# Patient Record
Sex: Female | Born: 1940
Health system: Southern US, Community
[De-identification: ages and names within clinical notes are randomized; demographics above are authoritative.]

## PROBLEM LIST (undated history)

## (undated) DIAGNOSIS — C50911 Malignant neoplasm of unspecified site of right female breast: Secondary | ICD-10-CM

## (undated) DIAGNOSIS — I10 Essential (primary) hypertension: Secondary | ICD-10-CM

## (undated) DIAGNOSIS — C50919 Malignant neoplasm of unspecified site of unspecified female breast: Secondary | ICD-10-CM

## (undated) DIAGNOSIS — M109 Gout, unspecified: Secondary | ICD-10-CM

## (undated) DIAGNOSIS — E785 Hyperlipidemia, unspecified: Secondary | ICD-10-CM

## (undated) DIAGNOSIS — M1990 Unspecified osteoarthritis, unspecified site: Secondary | ICD-10-CM

## (undated) DIAGNOSIS — E119 Type 2 diabetes mellitus without complications: Secondary | ICD-10-CM

## (undated) DIAGNOSIS — E7800 Pure hypercholesterolemia, unspecified: Secondary | ICD-10-CM

## (undated) DIAGNOSIS — Z923 Personal history of irradiation: Secondary | ICD-10-CM

## (undated) HISTORY — DX: Malignant neoplasm of unspecified site of right female breast: C50.911

## (undated) HISTORY — DX: Hyperlipidemia, unspecified: E78.5

## (undated) HISTORY — PX: ABDOMINAL HYSTERECTOMY: SHX81

## (undated) HISTORY — DX: Malignant neoplasm of unspecified site of unspecified female breast: C50.919

## (undated) HISTORY — DX: Pure hypercholesterolemia, unspecified: E78.00

## (undated) HISTORY — DX: Type 2 diabetes mellitus without complications: E11.9

## (undated) HISTORY — DX: Gout, unspecified: M10.9

## (undated) HISTORY — DX: Unspecified osteoarthritis, unspecified site: M19.90

## (undated) HISTORY — DX: Essential (primary) hypertension: I10

---

## 1990-11-12 DIAGNOSIS — C50919 Malignant neoplasm of unspecified site of unspecified female breast: Secondary | ICD-10-CM

## 1990-11-12 HISTORY — DX: Malignant neoplasm of unspecified site of unspecified female breast: C50.919

## 1990-11-12 HISTORY — PX: BREAST LUMPECTOMY: SHX2

## 2002-11-12 HISTORY — PX: ANKLE FRACTURE SURGERY: SHX122

## 2004-04-14 ENCOUNTER — Other Ambulatory Visit: Payer: Self-pay

## 2004-09-11 ENCOUNTER — Ambulatory Visit: Payer: Self-pay | Admitting: General Surgery

## 2005-09-24 ENCOUNTER — Ambulatory Visit: Payer: Self-pay | Admitting: General Surgery

## 2006-09-25 ENCOUNTER — Ambulatory Visit: Payer: Self-pay | Admitting: General Surgery

## 2007-03-17 ENCOUNTER — Ambulatory Visit: Payer: Self-pay | Admitting: Gastroenterology

## 2007-09-29 ENCOUNTER — Ambulatory Visit: Payer: Self-pay | Admitting: General Surgery

## 2008-10-11 ENCOUNTER — Ambulatory Visit: Payer: Self-pay | Admitting: Surgery

## 2008-11-12 HISTORY — PX: SHOULDER SURGERY: SHX246

## 2009-03-16 ENCOUNTER — Ambulatory Visit: Payer: Self-pay | Admitting: Unknown Physician Specialty

## 2009-04-18 ENCOUNTER — Ambulatory Visit: Payer: Self-pay | Admitting: Unknown Physician Specialty

## 2009-06-09 ENCOUNTER — Ambulatory Visit: Payer: Self-pay | Admitting: Anesthesiology

## 2009-06-13 ENCOUNTER — Ambulatory Visit: Payer: Self-pay | Admitting: Anesthesiology

## 2009-07-20 ENCOUNTER — Ambulatory Visit: Payer: Self-pay | Admitting: Anesthesiology

## 2009-08-15 ENCOUNTER — Ambulatory Visit: Payer: Self-pay | Admitting: Anesthesiology

## 2009-10-18 ENCOUNTER — Ambulatory Visit: Payer: Self-pay | Admitting: Surgery

## 2009-10-18 ENCOUNTER — Ambulatory Visit: Payer: Self-pay | Admitting: Anesthesiology

## 2009-11-23 ENCOUNTER — Ambulatory Visit: Payer: Self-pay | Admitting: Anesthesiology

## 2010-02-03 ENCOUNTER — Ambulatory Visit: Payer: Self-pay | Admitting: Anesthesiology

## 2010-05-19 ENCOUNTER — Ambulatory Visit: Payer: Self-pay | Admitting: Anesthesiology

## 2010-10-19 ENCOUNTER — Ambulatory Visit: Payer: Self-pay | Admitting: Surgery

## 2011-11-05 ENCOUNTER — Ambulatory Visit: Payer: Self-pay | Admitting: Surgery

## 2012-05-19 ENCOUNTER — Ambulatory Visit: Payer: Self-pay | Admitting: Obstetrics and Gynecology

## 2012-05-20 LAB — URINALYSIS, COMPLETE
Bacteria: NONE SEEN
Bilirubin,UR: NEGATIVE
Glucose,UR: NEGATIVE mg/dL (ref 0–75)
Ketone: NEGATIVE
Nitrite: NEGATIVE
RBC,UR: 40 /HPF (ref 0–5)
Specific Gravity: 1.016 (ref 1.003–1.030)
Squamous Epithelial: 3
WBC UR: 13 /HPF (ref 0–5)

## 2012-05-26 ENCOUNTER — Ambulatory Visit: Payer: Self-pay | Admitting: Obstetrics and Gynecology

## 2012-05-28 LAB — PATHOLOGY REPORT

## 2012-11-11 ENCOUNTER — Ambulatory Visit: Payer: Self-pay | Admitting: Family Medicine

## 2012-11-12 HISTORY — PX: MASTECTOMY: SHX3

## 2012-11-18 ENCOUNTER — Ambulatory Visit: Payer: Self-pay | Admitting: Family Medicine

## 2012-12-16 ENCOUNTER — Ambulatory Visit: Payer: Self-pay | Admitting: Oncology

## 2012-12-17 ENCOUNTER — Ambulatory Visit: Payer: Self-pay | Admitting: Oncology

## 2012-12-23 ENCOUNTER — Ambulatory Visit: Payer: Self-pay | Admitting: Oncology

## 2013-01-10 ENCOUNTER — Ambulatory Visit: Payer: Self-pay | Admitting: Oncology

## 2013-01-28 ENCOUNTER — Ambulatory Visit: Payer: Self-pay | Admitting: Surgery

## 2013-02-06 ENCOUNTER — Inpatient Hospital Stay: Payer: Self-pay | Admitting: Surgery

## 2013-02-06 LAB — CBC WITH DIFFERENTIAL/PLATELET
Basophil #: 0.1 10*3/uL (ref 0.0–0.1)
Basophil %: 0.5 %
Eosinophil #: 0 10*3/uL (ref 0.0–0.7)
HCT: 27.3 % — ABNORMAL LOW (ref 35.0–47.0)
HGB: 9.4 g/dL — ABNORMAL LOW (ref 12.0–16.0)
Lymphocyte #: 1 10*3/uL (ref 1.0–3.6)
Lymphocyte %: 10.6 %
MCV: 89 fL (ref 80–100)
Monocyte #: 0.7 x10 3/mm (ref 0.2–0.9)
Neutrophil #: 8 10*3/uL — ABNORMAL HIGH (ref 1.4–6.5)
WBC: 9.8 10*3/uL (ref 3.6–11.0)

## 2013-02-06 LAB — PATHOLOGY REPORT

## 2013-02-07 LAB — CBC WITH DIFFERENTIAL/PLATELET
Eosinophil %: 0.6 %
HCT: 25.4 % — ABNORMAL LOW (ref 35.0–47.0)
HGB: 8.3 g/dL — ABNORMAL LOW (ref 12.0–16.0)
Lymphocyte %: 20.2 %
MCH: 29.4 pg (ref 26.0–34.0)
MCV: 90 fL (ref 80–100)
Monocyte #: 0.4 x10 3/mm (ref 0.2–0.9)
Monocyte %: 5.1 %
Neutrophil #: 5.4 10*3/uL (ref 1.4–6.5)
Neutrophil %: 73.6 %
Platelet: 228 10*3/uL (ref 150–440)
WBC: 7.4 10*3/uL (ref 3.6–11.0)

## 2013-02-08 LAB — CBC WITH DIFFERENTIAL/PLATELET
Eosinophil: 4 %
HCT: 26 % — ABNORMAL LOW (ref 35.0–47.0)
HGB: 8.6 g/dL — ABNORMAL LOW (ref 12.0–16.0)
MCHC: 33 g/dL (ref 32.0–36.0)
MCV: 90 fL (ref 80–100)
Monocytes: 4 %
Myelocyte: 1 %
RBC: 2.87 10*6/uL — ABNORMAL LOW (ref 3.80–5.20)
RDW: 15.6 % — ABNORMAL HIGH (ref 11.5–14.5)
Segmented Neutrophils: 47 %
WBC: 7.9 10*3/uL (ref 3.6–11.0)

## 2013-02-10 ENCOUNTER — Ambulatory Visit: Payer: Self-pay | Admitting: Oncology

## 2013-02-23 LAB — COMPREHENSIVE METABOLIC PANEL
BUN: 18 mg/dL (ref 7–18)
Bilirubin,Total: 0.3 mg/dL (ref 0.2–1.0)
Chloride: 104 mmol/L (ref 98–107)
Co2: 25 mmol/L (ref 21–32)
Creatinine: 1.06 mg/dL (ref 0.60–1.30)
EGFR (Non-African Amer.): 53 — ABNORMAL LOW
Glucose: 103 mg/dL — ABNORMAL HIGH (ref 65–99)
Potassium: 4.2 mmol/L (ref 3.5–5.1)
SGOT(AST): 21 U/L (ref 15–37)
Sodium: 139 mmol/L (ref 136–145)

## 2013-02-23 LAB — CBC CANCER CENTER
Eosinophil #: 0.2 x10 3/mm (ref 0.0–0.7)
Eosinophil %: 2.9 %
HCT: 31.1 % — ABNORMAL LOW (ref 35.0–47.0)
HGB: 10.3 g/dL — ABNORMAL LOW (ref 12.0–16.0)
Lymphocyte %: 38.8 %
MCH: 29.4 pg (ref 26.0–34.0)
MCV: 89 fL (ref 80–100)
Monocyte #: 0.6 x10 3/mm (ref 0.2–0.9)
Monocyte %: 7.6 %
Neutrophil #: 3.6 x10 3/mm (ref 1.4–6.5)
Neutrophil %: 49.2 %
Platelet: 366 x10 3/mm (ref 150–440)
RBC: 3.49 10*6/uL — ABNORMAL LOW (ref 3.80–5.20)
WBC: 7.3 x10 3/mm (ref 3.6–11.0)

## 2013-03-12 ENCOUNTER — Ambulatory Visit: Payer: Self-pay | Admitting: Oncology

## 2013-04-12 ENCOUNTER — Ambulatory Visit: Payer: Self-pay | Admitting: Oncology

## 2013-04-16 DIAGNOSIS — C50911 Malignant neoplasm of unspecified site of right female breast: Secondary | ICD-10-CM

## 2013-04-16 HISTORY — DX: Malignant neoplasm of unspecified site of right female breast: C50.911

## 2013-05-12 ENCOUNTER — Ambulatory Visit: Payer: Self-pay | Admitting: Oncology

## 2013-06-09 ENCOUNTER — Ambulatory Visit: Payer: Self-pay | Admitting: Anesthesiology

## 2013-09-16 ENCOUNTER — Ambulatory Visit: Payer: Self-pay | Admitting: Oncology

## 2013-09-16 LAB — COMPREHENSIVE METABOLIC PANEL
Alkaline Phosphatase: 42 U/L — ABNORMAL LOW (ref 50–136)
Bilirubin,Total: 0.2 mg/dL (ref 0.2–1.0)
Calcium, Total: 9.1 mg/dL (ref 8.5–10.1)
Chloride: 106 mmol/L (ref 98–107)
Co2: 26 mmol/L (ref 21–32)
Osmolality: 282 (ref 275–301)
Potassium: 3.7 mmol/L (ref 3.5–5.1)
Sodium: 140 mmol/L (ref 136–145)

## 2013-09-16 LAB — CBC CANCER CENTER
Basophil #: 0.1 x10 3/mm (ref 0.0–0.1)
Basophil %: 1.5 %
Eosinophil #: 0.1 x10 3/mm (ref 0.0–0.7)
Eosinophil %: 0.9 %
HGB: 12.3 g/dL (ref 12.0–16.0)
MCH: 28.9 pg (ref 26.0–34.0)
MCV: 88 fL (ref 80–100)
Monocyte %: 6 %
Neutrophil %: 55.6 %
RDW: 15 % — ABNORMAL HIGH (ref 11.5–14.5)

## 2013-10-12 ENCOUNTER — Ambulatory Visit: Payer: Self-pay | Admitting: Oncology

## 2013-11-17 ENCOUNTER — Ambulatory Visit: Payer: Self-pay | Admitting: Oncology

## 2014-01-11 DIAGNOSIS — M1990 Unspecified osteoarthritis, unspecified site: Secondary | ICD-10-CM | POA: Insufficient documentation

## 2014-01-11 DIAGNOSIS — I10 Essential (primary) hypertension: Secondary | ICD-10-CM | POA: Insufficient documentation

## 2014-01-11 DIAGNOSIS — M7989 Other specified soft tissue disorders: Secondary | ICD-10-CM | POA: Insufficient documentation

## 2014-01-11 DIAGNOSIS — G629 Polyneuropathy, unspecified: Secondary | ICD-10-CM | POA: Insufficient documentation

## 2014-01-11 DIAGNOSIS — M799 Soft tissue disorder, unspecified: Secondary | ICD-10-CM | POA: Insufficient documentation

## 2014-01-11 DIAGNOSIS — M810 Age-related osteoporosis without current pathological fracture: Secondary | ICD-10-CM | POA: Insufficient documentation

## 2014-01-11 DIAGNOSIS — Z87442 Personal history of urinary calculi: Secondary | ICD-10-CM | POA: Insufficient documentation

## 2014-01-11 DIAGNOSIS — Z853 Personal history of malignant neoplasm of breast: Secondary | ICD-10-CM | POA: Insufficient documentation

## 2014-01-11 DIAGNOSIS — E785 Hyperlipidemia, unspecified: Secondary | ICD-10-CM | POA: Insufficient documentation

## 2014-01-11 DIAGNOSIS — E119 Type 2 diabetes mellitus without complications: Secondary | ICD-10-CM | POA: Insufficient documentation

## 2014-03-26 ENCOUNTER — Ambulatory Visit: Payer: Self-pay | Admitting: Oncology

## 2014-03-26 LAB — CBC CANCER CENTER
BASOS PCT: 1.1 %
Basophil #: 0.1 x10 3/mm (ref 0.0–0.1)
EOS ABS: 0.1 x10 3/mm (ref 0.0–0.7)
EOS PCT: 1.2 %
HCT: 37.7 % (ref 35.0–47.0)
HGB: 12.4 g/dL (ref 12.0–16.0)
LYMPHS ABS: 2.7 x10 3/mm (ref 1.0–3.6)
LYMPHS PCT: 32.6 %
MCH: 29.3 pg (ref 26.0–34.0)
MCHC: 32.7 g/dL (ref 32.0–36.0)
MCV: 90 fL (ref 80–100)
MONO ABS: 0.4 x10 3/mm (ref 0.2–0.9)
Monocyte %: 5 %
Neutrophil #: 5 x10 3/mm (ref 1.4–6.5)
Neutrophil %: 60.1 %
PLATELETS: 256 x10 3/mm (ref 150–440)
RBC: 4.22 10*6/uL (ref 3.80–5.20)
RDW: 15.8 % — AB (ref 11.5–14.5)
WBC: 8.3 x10 3/mm (ref 3.6–11.0)

## 2014-03-26 LAB — COMPREHENSIVE METABOLIC PANEL WITH GFR
Albumin: 3.4 g/dL
Alkaline Phosphatase: 37 U/L — ABNORMAL LOW
Anion Gap: 8
BUN: 20 mg/dL — ABNORMAL HIGH
Bilirubin,Total: 0.4 mg/dL
Calcium, Total: 9 mg/dL
Chloride: 104 mmol/L
Co2: 30 mmol/L
Creatinine: 1 mg/dL
EGFR (African American): >60
EGFR (Non-African Amer.): 57 — ABNORMAL LOW
Glucose: 134 mg/dL — ABNORMAL HIGH
Osmolality: 288
Potassium: 3.5 mmol/L
SGOT(AST): 18 U/L
SGPT (ALT): 22 U/L
Sodium: 142 mmol/L
Total Protein: 7 g/dL

## 2014-04-12 ENCOUNTER — Ambulatory Visit: Payer: Self-pay | Admitting: Oncology

## 2014-09-24 ENCOUNTER — Ambulatory Visit: Payer: Self-pay | Admitting: Oncology

## 2014-10-01 LAB — CBC CANCER CENTER
Basophil #: 0.1 x10 3/mm (ref 0.0–0.1)
Basophil %: 0.6 %
EOS ABS: 0.1 x10 3/mm (ref 0.0–0.7)
Eosinophil %: 1 %
HCT: 36.5 % (ref 35.0–47.0)
HGB: 12 g/dL (ref 12.0–16.0)
LYMPHS ABS: 2.7 x10 3/mm (ref 1.0–3.6)
Lymphocyte %: 29.3 %
MCH: 29.7 pg (ref 26.0–34.0)
MCHC: 32.9 g/dL (ref 32.0–36.0)
MCV: 90 fL (ref 80–100)
MONOS PCT: 5.1 %
Monocyte #: 0.5 x10 3/mm (ref 0.2–0.9)
Neutrophil #: 5.9 x10 3/mm (ref 1.4–6.5)
Neutrophil %: 64 %
PLATELETS: 233 x10 3/mm (ref 150–440)
RBC: 4.03 10*6/uL (ref 3.80–5.20)
RDW: 14.6 % — AB (ref 11.5–14.5)
WBC: 9.3 x10 3/mm (ref 3.6–11.0)

## 2014-10-01 LAB — COMPREHENSIVE METABOLIC PANEL
Albumin: 3.4 g/dL (ref 3.4–5.0)
Alkaline Phosphatase: 36 U/L — ABNORMAL LOW
Anion Gap: 10 (ref 7–16)
BUN: 18 mg/dL (ref 7–18)
Bilirubin,Total: 0.4 mg/dL (ref 0.2–1.0)
Calcium, Total: 8.9 mg/dL (ref 8.5–10.1)
Chloride: 104 mmol/L (ref 98–107)
Co2: 28 mmol/L (ref 21–32)
Creatinine: 1.2 mg/dL (ref 0.60–1.30)
EGFR (African American): 57 — ABNORMAL LOW
GFR CALC NON AF AMER: 47 — AB
Glucose: 101 mg/dL — ABNORMAL HIGH (ref 65–99)
OSMOLALITY: 285 (ref 275–301)
POTASSIUM: 3.9 mmol/L (ref 3.5–5.1)
SGOT(AST): 16 U/L (ref 15–37)
SGPT (ALT): 17 U/L
Sodium: 142 mmol/L (ref 136–145)
TOTAL PROTEIN: 6.8 g/dL (ref 6.4–8.2)

## 2014-10-12 ENCOUNTER — Ambulatory Visit: Payer: Self-pay | Admitting: Oncology

## 2014-11-19 ENCOUNTER — Ambulatory Visit: Payer: Self-pay | Admitting: Surgery

## 2015-03-01 NOTE — Consult Note (Signed)
Chief Complaint:   Subjective/Chief Complaint feels much better, some cough but breathing close to baseline.   VITAL SIGNS/ANCILLARY NOTES: **Vital Signs.:   16-Jul-13 12:06   Vital Signs Type Routine   Temperature Temperature (F) 98.3   Celsius 36.8   Temperature Source Oral   Pulse Pulse 77   Respirations Respirations 18   Systolic BP Systolic BP 841   Diastolic BP (mmHg) Diastolic BP (mmHg) 57   Mean BP 71   Pulse Ox % Pulse Ox % 95   Pulse Ox Activity Level  At rest   Oxygen Delivery Room Air/ 21 %   Brief Assessment:   Cardiac Regular  no murmur    Respiratory normal resp effort  clear BS    Gastrointestinal Normal   Assessment/Plan:  Assessment/Plan:   Assessment This is a 74 year old female with history of diabetes, hypertension, and hyperlipidemia who underwent elective hysterectomy 05/26/2012 seen for prolonged intubation and mechanical ventilation post surgery.  1. Prolonged intubation, mechanical ventilation post surgery: myasthenia gravis is felt to be less likely. There is a strong possibility that the patient is sensitive to general anesthesia and neuromuscular blocking agents. If further evaluation is desired for myasthenia gravis, it can be done as an outpatient. her breathing is close to baseline.  2. Hypertension: stable on current regimen  3. History of diabetes. Should follow the patient's Accu-Cheks. Prevent hypoglycemia. Restart her oral medications when she is able to take orally.    Plan Will sign off.  Please call with questions. medically stable for discharge.  time spent: 15 mins   Electronic Signatures: Remer Macho (MD)  (Signed 16-Jul-13 15:16)  Authored: Chief Complaint, VITAL SIGNS/ANCILLARY NOTES, Brief Assessment, Assessment/Plan   Last Updated: 16-Jul-13 15:16 by Remer Macho (MD)

## 2015-03-01 NOTE — Consult Note (Signed)
PATIENT NAME:  [name], [name] MR#:  [id number] DATE OF BIRTH:  [birth year]  DATE OF CONSULTATION:  05/26/2012  REFERRING PHYSICIAN:  Dr. Dorette Grate  CONSULTING PHYSICIAN:  Cherre Huger, MD  REASON FOR CONSULTATION: Prolonged mechanical ventilation after general anesthesia.   HISTORY OF PRESENT ILLNESS: The patient is a 74 year old female with past medical history of diabetes, hypertension, and hyperlipidemia who underwent elective vaginal hysterectomy today. The patient as part of her preop history had given history that in 2004 after general anesthesia she had prolonged intubation. She was asked if she had any myasthenia gravis or pseudocholinesterase deficiency. However, the patient denied that. The patient's anesthesia was induced without succinylcholine. She was given rocuronium for neuromuscular paralysis. She received reversal of general anesthesia twice because the first time she was felt to be too weak. After extubation the patient continued to have generalized muscle/motor weakness, therefore, she was reintubated, A TO4> revealed that she currently has good motor strength and was thereafter successfully extubated. The anesthesiologist wanted evaluation for further for prolonged intubation after surgery. Therefore, Medicine was consulted.   ALLERGIES: Sulfa.   MEDICATIONS:  1. Atenolol 50 mg daily.  2. Pravachol 40 mg daily.  3. Lisinopril/HCTZ 12.5 mg/10 half a tablet daily.  4. Metformin 500 mg daily.  5. Fosamax weekly.  6. Vitamin D 50,000 international units q. weekly.   PAST MEDICAL HISTORY/SURGICAL HISTORY:  1. Hypertension.  2. Diabetes.  3. Hyperlipidemia.  4. Osteoarthritis.  5. Kidney stones.  6. Status post breast cancer. 7. Bladder prolapse. 8. ORIF ankle. 9. Breast biopsy. 10. Cholecystectomy. 11. Right shoulder surgery. 12. Right lumpectomy.  13. Appendectomy.   SOCIAL HISTORY: The patient denies any history of smoking, alcohol, or drug abuse.    FAMILY HISTORY: Mother and father had hypertension.   REVIEW OF SYSTEMS: The patient reports that in 2004 she was on the ventilator for a prolonged period of time. However, she denies any muscle fatigue, fluctuating skeletal muscle weakness, diplopia, ptosis, dysarthria, dysphagia, any neck or distal limb weakness. She ambulates with a cane because of pain in her ankle after her surgery, not because of any generalized motor weakness. She does all her activities of daily living.   REVIEW OF SYSTEMS: CONSTITUTIONAL: Denies any fever or fatigue. EYES: Denies any blurred or double vision. CARDIOVASCULAR: Denies any chest pain, shortness of breath. RESPIRATORY: Denies any cough, wheezing. GI: Denies any nausea, vomiting, abdominal pain. GU: Denies any dysuria, hematuria. ENDOCRINE: Denies any polyuria or nocturia. MUSCULOSKELETAL: Denies any swelling, muscle weakness. NEUROLOGICAL: Denies any focal weakness, seizure activity, headache. PSYCH: Denies any anxiety, depression. ENDOCRINE: Denies any polyuria, nocturia.   PHYSICAL EXAMINATION:   VITAL SIGNS: Temperature afebrile, heart rate 70, respiratory rate 12, blood pressure 150/70, pulse oximetry 97% on a Ventimask.   HEAD: Atraumatic, normocephalic.   EYES: No pallor, icterus, or cyanosis. Pupils equal, round, and reactive to light and accommodation. Extraocular movements intact.   ENT: Dry mucous membranes. No oropharyngeal erythema or thrush. The patient's voice is hoarse because of the intubation and extubation.   NECK: Supple. No masses. No JVD. No thyromegaly or lymphadenopathy.   CHEST WALL: No tenderness to palpation. Not using accessory muscles of respiration. No intercostal muscle retractions.   LUNGS: Few basal crepitations. No wheezing or rhonchi.   CARDIOVASCULAR: S1, S2 regular. No murmur, rubs, or gallops.   ABDOMEN: Soft. No guarding. No rigidity. Hyperactive bowel sounds.   SKIN: No rashes or lesions.   PERIPHERIES: No  pedal edema.  NEUROLOGIC: The patient is lethargic but oriented x3. Nonfocal neurological examination. Cranial nerves grossly intact.    PSYCH: Lethargic.   ASSESSMENT AND PLAN: This is a 74 year old female with history of diabetes, hypertension, and hyperlipidemia who underwent elective hysterectomy 05/26/2012. Anesthesiologist consulted Medicine for prolonged intubation and mechanical ventilation post surgery. 1. Prolonged intubation, mechanical ventilation post surgery. The anesthesiologist was concerned about pseudocholinesterase deficiency or myasthenia gravis. The patient denies any family history of myasthenia gravis. She denies any history of diplopia, ptosis, neck or distal muscle weakness. Denies any history of CVA, dysarthria, dysphagia, fluctuating skeletal muscle weakness or muscle fatigue. This is the second episode where after general anesthesia she had prolonged intubation and mechanical ventilation. The first was in 2004. Based on symptom evaluation, diagnosis of myasthenia gravis is felt to be less likely. There is a strong possibility that the patient is sensitive to general anesthesia and neuromuscular blocking agents. If further evaluation is desired for myasthenia gravis, it can be done as an outpatient. For the time being recommend observing the patient's respiratory status carefully. Currently she is not hypoxic, appears to have good respiratory effort and cough. Recommend inpatient monitoring.  2. Hypertension. Should continue the patient's antihypertensive medications once she is able to take orally.  3. History of diabetes. Should follow the patient's Accu-Cheks. Prevent hypoglycemia. Restart her oral medications when she is able to take orally.   Reviewed old medical records and discussed with Dr. Phineas Douglas.   TIME SPENT: 75 minutes.   ____________________________ Cherre Huger, MD sp:drc D: 05/26/2012 15:02:31 ET T: 05/26/2012 15:29:25 ET JOB#: [id number]  cc: Cherre Huger, MD, <Dictator> Cherre Huger MD ELECTRONICALLY SIGNED 05/26/2012 17:09

## 2015-03-03 DIAGNOSIS — G8929 Other chronic pain: Secondary | ICD-10-CM | POA: Insufficient documentation

## 2015-03-03 DIAGNOSIS — M25562 Pain in left knee: Secondary | ICD-10-CM

## 2015-03-03 DIAGNOSIS — M25561 Pain in right knee: Secondary | ICD-10-CM

## 2015-03-04 NOTE — Op Note (Signed)
PATIENT NAME:  [name], [name] MR#:  [id number] DATE OF BIRTH:  09/29/1941  DATE OF PROCEDURE:  02/06/2013  PREOPERATIVE DIAGNOSIS: Postoperative bleeding with hematoma of right breast.   POSTOPERATIVE DIAGNOSIS: Postoperative bleeding with hematoma of right breast.   PROCEDURE: Exploration of right breast wound with suture ligation of blood vessels and evacuation of hematoma.   SURGEON: Loreli Dollar, MD   ANESTHESIA: General.   INDICATION: This 74 year old female, with a past history of cancer of the medial aspect of right breast, with previous lymph node dissection and radiation and chemotherapy, recently had a second breast cancer and yesterday had right simple mastectomy. There were no complications during the procedure. She did have 2 Blake drains inserted, and it was realized during the postoperative period that she was developing a hematoma, and exploration was recommended for further evaluation and treatment.   DESCRIPTION OF PROCEDURE: The patient was placed on the operating table in the supine position under general anesthesia. The portions of glue were removed from the wound of the right breast. The Tegaderm and tape were removed which secured the drains. There were 3-0 nylon sutures that remained around the drains. The site was prepared with ChloraPrep and draped in a sterile manner.   The simple mastectomy incision was opened from one end to the other. Glue and suture material were removed. There was a large hematoma within essentially the full extent of the breast wound. There was no bright red bleeding seen at first, but just black hematoma, which was evacuated using a combination of suction, use of lap pads and some gentle abrasion. I spent a good bit of time evacuating hematoma, including small bits of hematoma that were semi-adherent to surrounding tissues. Hematoma did extend from medial to lateral and the cranial extent of the wound and caudal extent of the wound. Estimated  blood loss for the entire procedure was approximately 200 mL, including clot. It was noted that during the course of the procedure, there were a number of small bleeding points which developed and were cauterized. There was one point in the procedure where, medially located, there was an arterial bleeding point which spurted blood, and this small artery was suture ligated with 3-0 chromic, and also during the course of the procedure, there were 2 sites laterally overlying the ribs which were not adequately controlled with electrocautery and were suture ligated with 3-0 chromic. The wound was copiously irrigated with warm saline solution, and the wound was carefully inspected. Several tiny bleeding points were cauterized. Hemostasis was subsequently determined to be intact. The drains remained intact. Next, the wound was closed with a running 4-0 Monocryl subcuticular suture and Dermabond. Also, Dermabond was applied around the drains. The drains were further secured with benzoin and Tegaderm and 2-inch silk tape.   The patient appeared to tolerate the procedure satisfactorily and was then prepared for transfer to the recovery room.   ____________________________ Lenna Sciara. Rochel Brome, MD jws:OSi D: 02/06/2013 16:54:35 ET T: 02/07/2013 09:09:31 ET JOB#: [id number]  cc: Loreli Dollar, MD, <Dictator> Loreli Dollar MD ELECTRONICALLY SIGNED 02/11/2013 17:54

## 2015-03-04 NOTE — Op Note (Signed)
PATIENT NAME:  [name], [name] MR#:  [id number] DATE OF BIRTH:  1941/02/11  DATE OF PROCEDURE:  02/05/2013  PREOPERATIVE DIAGNOSIS: Carcinoma of the right breast.   POSTOPERATIVE DIAGNOSIS:  Carcinoma of the right breast.   PROCEDURE:  Right mastectomy.   SURGEON: Rochel Brome, MD  ANESTHESIA: General.   INDICATION: This 73 year old female with past history of carcinoma of the medial aspect of the right breast with history of right partial mastectomy, lymph node dissection and radiation. She recently developed a mass of the medial, central aspect right breast. Biopsy demonstrated cancer and mastectomy was recommended for further treatment.   DESCRIPTION OF PROCEDURE: The patient was placed on the operating table in the supine position under general anesthesia. The right arm was placed on a lateral arm support. The right breast was prepared with ChloraPrep and draped in a sterile manner. The mass was palpated at the medial aspect of the retroareolar portion of the breast. I noted the site of the old partial mastectomy scar,  which is radially oriented at the 3 o'clock position. Curvilinear incisions were made from medial to lateral encompassing the old partial mastectomy site and also encompassing the central aspect of the breast. Skin flaps were retracted with 3-0 silk sutures. The skin and subcutaneous flaps were raised in the direction of the clavicle, sternum, inferior mammary fold and latissimus dorsi muscle. Electrocautery was used for hemostasis. The breast was elevated off the underlying fascia with electrocautery from medial to lateral. Dissection was carried out up into the axilla removing the axillary tail. The lateral end of the skin ellipse was tagged with a stitch for the pathologist's orientation and the breast specimen was submitted for routine pathology. It is noted that multiple clamped vessels were ligated with 3-0 chromic ligatures and suture ligatures. Multiple small bleeding  points were cauterized. The entire wound was carefully inspected until hemostasis was intact. Next, two [address] [place] Blake drains were inserted and brought out through separate inferior stab wounds.  One was placed up into the axilla and the other along the medial aspect right chest wall. Each drain was secured to the skin with 3-0 nylon suture. Next, the skin was closed with a running 4-0 Monocryl subcuticular suture and Dermabond. The site of the exit point of the drains was dressed with benzoin and Tegaderm and also the drains were anchored to the skin with benzoin and 2 inch silk tape. The drains were activated. There was scant serosanguineous drainage. The patient tolerated the procedure satisfactorily and was then prepared for transfer to the recovery room.     ____________________________ Lenna Sciara. Rochel Brome, MD jws:cc D: 02/05/2013 15:30:37 ET T: 02/05/2013 15:58:47 ET JOB#: [id number]  cc: Loreli Dollar, MD, <Dictator> Loreli Dollar MD ELECTRONICALLY SIGNED 02/05/2013 20:50

## 2015-03-04 NOTE — Discharge Summary (Signed)
PATIENT NAME:  [name], [name] MR#:  [id number] DATE OF BIRTH:  [birth year]  DATE OF ADMISSION:  02/06/2013 DATE OF DISCHARGE:  02/08/2013  HISTORY OF PRESENT ILLNESS: This is a 74 year old female with past history of carcinoma of the medial aspect of the right breast and history of right partial mastectomy, lymph node dissection and radiation, who recently developed a mass at the medial aspect of the central portion of the right breast. Biopsy demonstrated cancer and mastectomy was recommended for further treatment.   Details of the history and physical are recorded on the history and physical.  The patient came in through the outpatient surgery department and was carried to the operating room where she had a simple mastectomy with insertion of drains. There was no unusual bleeding during the course of the surgery. Postoperatively, she was kept in observation bed. IV fluids, analgesics and nurses monitored her drains. She did develop postoperative swelling, which according to the physical exam appeared to be a hematoma. The patient was carried back to the operating room the next day and reopened the incision, evacuated a hematoma, cauterized several small points which started bleeding during the case and also suture ligated one bleeding point. Postoperatively, the patient did still have her drains in. She was kept in the hospital for an additional period of observation and has subsequently tolerated her diet and had minimal amount of postoperative drainage.   FINAL DIAGNOSIS: Carcinoma of the right breast.   Discharge instructions given and plans made for followup in the office.  ____________________________ J. Rochel Brome, MD jws:aw D: 02/23/2013 09:07:37 ET T: 02/23/2013 09:21:43 ET JOB#: [id number]  cc: Loreli Dollar, MD, <Dictator> Loreli Dollar MD ELECTRONICALLY SIGNED 02/25/2013 21:34

## 2015-03-04 NOTE — H&P (Signed)
PATIENT NAME:  [name], [name] MR#:  [id number] DATE OF BIRTH:  [birth year]  DATE OF ADMISSION:  06/09/2013  CHIEF COMPLAINT: Left posterolateral leg pain and back pain.   PROCEDURE: None.   HISTORY OF PRESENT ILLNESS: [name] is a pleasant 74 year old white female with a long-standing history of left-sided lower back pain. She was seen by me a few years ago in the [facility] for similar complaints, namely low back pain with left side radiating pain, with an MRI dated June 2010 showing evidence of multilevel degenerative disk disease most notably at L4-L5, with evidence of spinal canal stenosis. There is also evidence of facet hypertrophy at L4-L5.   PAST MEDICAL HISTORY: Significant for high blood pressure, kidney stones, diabetes, osteoarthritis and history of breast CA.   SOCIAL HISTORY: She is married, 2 children. Never smoked. Works part time.   PAST SURGICAL HISTORY: Includes right breast lumpectomy and radiation therapy, right ankle surgery, right shoulder surgery, cholecystectomy, vaginal hysterectomy.   CURRENT MEDICATIONS: Include Fosamax, gemfibrozil, metformin, vitamin D, atenolol, tamoxifen, acetaminophen and HCTZ/lisinopril   PHYSICAL EXAMINATION:  VITAL SIGNS: Reveals a blood pressure of 154/77, pulse 66, respirations 16, O2 saturation 97%.  EYES: Her pupils are equally round and reactive to light. Extraocular muscles intact.  HEART: Regular rate and rhythm.  LUNGS: Clear to auscultation without wheezing or rales.  MUSCULOSKELETAL AND NEUROLOGIC: Inspection of the low back reveals some mild paraspinous muscle tenderness with no overt trigger points. She does have some mild pain on extension. She is ambulating well. Her strength to the lower extremities appears to be well preserved. She does use a cane for support.   IMAGING: Results on a PET scan dated 12/23/2012 showing no definitive foci of abnormal hypermetabolic activity and equivocal findings at T6 with what  appears to be a degenerative process.   ASSESSMENT:  1. Spinal stenosis.  2. Degenerative disk disease.  3. Radiculitis, L5, right side.   PLAN: 1. [name] appears to have recurrence of a chronic condition with some radiculitis in the L5 distribution, right side, that has previously responded to epidural steroid administration. I think it would be reasonable to proceed with a repeat series of these. She does not desire to proceed with this today, and we will make plans to proceed with epidural steroid on her next visit in approximately 1 week. The risks and benefits were once again reviewed. All questions answered. No guarantees are made.  2. Continue with stretching and strengthening exercises.  3. She may be a candidate for a diagnostic facet block, right side.   ____________________________ Alvina Filbert Andree Elk, MD jga:OSi D: 06/10/2013 09:27:50 ET T: 06/10/2013 09:45:14 ET JOB#: [id number]  cc: Alvina Filbert. Andree Elk, MD, <Dictator> Dr. Christiane Ha Annalysa Mohammad MD ELECTRONICALLY SIGNED 06/12/2013 8:21

## 2015-03-04 NOTE — H&P (Signed)
PATIENT NAME:  [name], [name] MR#:  [id number] DATE OF BIRTH:  1941-03-23  DATE OF ADMISSION:  02/06/2013  This 74 year old came in for right mastectomy. She had recent findings of right breast mass and biopsy demonstrated invasive ductal carcinoma. ER/PR positive, HER-2/neu negative. She has had oncology evaluation. PET scan showed no evidence of distant metastasis.  She had a history of carcinoma of the right breast 21 years ago, had right partial mastectomy with incision at the 3 o'clock position radially oriented and had lymph node dissection. Lymph nodes were negative.  OTHER PAST MEDICAL HISTORY:  Includes non-insulin-dependent diabetes mellitus, hypertension, hyperlipidemia, arthritis, kidney stones, osteoporosis.   MEDICATIONS: Include pravastatin, lisinopril-hydrochlorothiazide, atenolol, metformin, Fosamax, gemfibrozil; doses are same as recorded on preop history and physical.   ALLERGIES:  SULFA.   PHYSICAL EXAMINATION: GENERAL:  She was seen in the preop holding area, was awake, alert and oriented.  HEENT:  Sclerae clear. Pharynx clear.  HEART:  Regular rhythm, S1, S2.  LUNGS:  Clear.   The right breast was marked with "yes."   DIAGNOSIS:  Carcinoma of the right breast.  PAST HISTORY:  Carcinoma of the right breast.   Plans made for right mastectomy as discussed.   ____________________________ Lenna Sciara. Rochel Brome, MD jws:ce D: 03/05/2013 17:17:40 ET T: 03/05/2013 18:07:45 ET JOB#: [id number]  cc: Loreli Dollar, MD, <Dictator> Loreli Dollar MD ELECTRONICALLY SIGNED 03/09/2013 9:58

## 2015-03-06 NOTE — Op Note (Signed)
PATIENT NAME:  [name], [name] MR#:  [id number] DATE OF BIRTH:  1941/06/06  DATE OF PROCEDURE:  05/26/2012  PREOPERATIVE DIAGNOSIS: Symptomatic pelvic relaxation.   POSTOPERATIVE DIAGNOSIS: Symptomatic pelvic relaxation.   PROCEDURES:  1. TVH. 2. Anterior repair.   SURGEON: Ricky L. Amalia Hailey, MD   ASSISTANT: Boykin Nearing, MD   ANESTHESIA: General endotracheal.   FINDINGS: Cube pessary in vagina which was cleaned and packaged to take back to the office to offer patient. Some uterine descensus with moderate to large cystocele. Good spillage of urine after placement of Foley catheter.   SPECIMENS: Uterus without cervix.  ESTIMATED BLOOD LOSS: 200.   COMPLICATIONS: None.   DRAINS: Foley.  PACKING: Present.   PROCEDURE IN DETAIL: Patient consented. She was taken to the operating room and placed in the supine position and anesthesia was initiated. She was then placed in the dorsal lithotomy position using candy-cane stirrups, prepped and draped in the usual sterile fashion. The pessary was still there and the pessary was removed and the vagina was prepped again. She was then draped. The cervix was visualized and injected circumferentially with dilute vasopressin; there was a small ulceration noted at 1 o'clock and care was taken to include this in the excised specimen.   Uterus was cut circumferentially with scalpel after injecting circumferentially with dilute vasopressin and direct entry was made in the posterior cul-de-sac and weighted speculum was placed and posterior cuff was tagged. We then alternated clamp, cut, and tie technique using Heaney-Ballantine clamps and 0 Vicryl suture until uterus was delivered. The posterior cuff was closed with a running baseball-type stitch of 0 Vicryl. Next, we turned our attention to the bladder.   Taking care to stay in the midline, anterior vault was injected with dilute vasopressin 10 mL. Allis were then placed at 1 o'clock and 11  o'clock and Metzenbaum scissors were used to bluntly and sharply dissect along the midline which was divided. The edges of this new epithelial incision were grasped bilaterally with Allis-Adairs and scalpel was used to lightly score the underlying tissue which was then bluntly dissected with the overlying epithelium without difficulty bilaterally. Purse stitch of 3-0 Vicryl was then used to reduce the bulk of the midline defect and then 3-0 Ethibond was used in simple interrupted sutures to approximate the underlying tissue. Excess epithelium was trimmed free and anterior vault and anterior portion of the cuff were closed with a running interlocking of 0 Vicryl.   Areas were inspected for hemostasis and seemed to be excellent. Bladder continued to drain clear urine. Vault was packed with gauze soaked in Premarin and this was tied to the catheter, both of which will be removed in the morning.   The patient tolerated the procedure well. All instrument, needle, and sponge counts were correct. She received 1 gram Ancef preoperatively. I anticipate a routine postoperative course.   ____________________________ Rockey Situ. Amalia Hailey, MD rle:drc D: 05/26/2012 12:27:57 ET T: 05/26/2012 12:59:31 ET JOB#: [id number]  cc: Audry Pili L. Amalia Hailey, MD, <Dictator> Selmer Dominion MD ELECTRONICALLY SIGNED 05/26/2012 13:52

## 2015-04-01 ENCOUNTER — Other Ambulatory Visit: Payer: Self-pay | Admitting: *Deleted

## 2015-04-01 DIAGNOSIS — C50919 Malignant neoplasm of unspecified site of unspecified female breast: Secondary | ICD-10-CM

## 2015-04-06 ENCOUNTER — Ambulatory Visit: Payer: Self-pay | Admitting: Oncology

## 2015-04-06 ENCOUNTER — Other Ambulatory Visit: Payer: Self-pay

## 2015-04-13 ENCOUNTER — Inpatient Hospital Stay: Payer: Medicare HMO | Attending: Oncology

## 2015-04-13 ENCOUNTER — Inpatient Hospital Stay (HOSPITAL_BASED_OUTPATIENT_CLINIC_OR_DEPARTMENT_OTHER): Payer: Medicare HMO | Admitting: Oncology

## 2015-04-13 ENCOUNTER — Encounter (INDEPENDENT_AMBULATORY_CARE_PROVIDER_SITE_OTHER): Payer: Self-pay

## 2015-04-13 VITALS — BP 123/74 | HR 71 | Temp 95.7°F | Wt 147.7 lb

## 2015-04-13 DIAGNOSIS — I10 Essential (primary) hypertension: Secondary | ICD-10-CM

## 2015-04-13 DIAGNOSIS — C50911 Malignant neoplasm of unspecified site of right female breast: Secondary | ICD-10-CM

## 2015-04-13 DIAGNOSIS — Z170 Estrogen receptor positive status [ER+]: Secondary | ICD-10-CM

## 2015-04-13 DIAGNOSIS — C50919 Malignant neoplasm of unspecified site of unspecified female breast: Secondary | ICD-10-CM

## 2015-04-13 DIAGNOSIS — Z79810 Long term (current) use of selective estrogen receptor modulators (SERMs): Secondary | ICD-10-CM

## 2015-04-13 DIAGNOSIS — Z853 Personal history of malignant neoplasm of breast: Secondary | ICD-10-CM | POA: Diagnosis not present

## 2015-04-13 DIAGNOSIS — Z79899 Other long term (current) drug therapy: Secondary | ICD-10-CM | POA: Diagnosis not present

## 2015-04-13 DIAGNOSIS — E119 Type 2 diabetes mellitus without complications: Secondary | ICD-10-CM | POA: Insufficient documentation

## 2015-04-13 DIAGNOSIS — Z923 Personal history of irradiation: Secondary | ICD-10-CM

## 2015-04-13 DIAGNOSIS — M1990 Unspecified osteoarthritis, unspecified site: Secondary | ICD-10-CM | POA: Diagnosis not present

## 2015-04-13 LAB — COMPREHENSIVE METABOLIC PANEL
ALK PHOS: 28 U/L — AB (ref 38–126)
ALT: 18 U/L (ref 14–54)
AST: 26 U/L (ref 15–41)
Albumin: 3.8 g/dL (ref 3.5–5.0)
Anion gap: 7 (ref 5–15)
BUN: 36 mg/dL — ABNORMAL HIGH (ref 6–20)
CO2: 26 mmol/L (ref 22–32)
Calcium: 8.3 mg/dL — ABNORMAL LOW (ref 8.9–10.3)
Chloride: 100 mmol/L — ABNORMAL LOW (ref 101–111)
Creatinine, Ser: 1.27 mg/dL — ABNORMAL HIGH (ref 0.44–1.00)
GFR calc Af Amer: 48 mL/min — ABNORMAL LOW (ref >60)
GFR calc non Af Amer: 41 mL/min — ABNORMAL LOW (ref >60)
GLUCOSE: 104 mg/dL — AB (ref 65–99)
POTASSIUM: 4 mmol/L (ref 3.5–5.1)
Sodium: 133 mmol/L — ABNORMAL LOW (ref 135–145)
TOTAL PROTEIN: 6.9 g/dL (ref 6.5–8.1)
Total Bilirubin: 0.4 mg/dL (ref 0.3–1.2)

## 2015-04-13 LAB — CBC WITH DIFFERENTIAL/PLATELET
Basophils Absolute: 0.1 10*3/uL (ref 0–0.1)
Basophils Relative: 1 %
EOS ABS: 0.2 10*3/uL (ref 0–0.7)
EOS PCT: 2 %
HEMATOCRIT: 37 % (ref 35.0–47.0)
Hemoglobin: 11.8 g/dL — ABNORMAL LOW (ref 12.0–16.0)
LYMPHS ABS: 2.5 10*3/uL (ref 1.0–3.6)
LYMPHS PCT: 26 %
MCH: 28.5 pg (ref 26.0–34.0)
MCHC: 32 g/dL (ref 32.0–36.0)
MCV: 88.9 fL (ref 80.0–100.0)
MONOS PCT: 6 %
Monocytes Absolute: 0.5 10*3/uL (ref 0.2–0.9)
NEUTROS ABS: 6.3 10*3/uL (ref 1.4–6.5)
NEUTROS PCT: 65 %
PLATELETS: 284 10*3/uL (ref 150–440)
RBC: 4.16 MIL/uL (ref 3.80–5.20)
RDW: 14.8 % — ABNORMAL HIGH (ref 11.5–14.5)
WBC: 9.7 10*3/uL (ref 3.6–11.0)

## 2015-04-13 NOTE — Progress Notes (Signed)
Pt has never smoked.  Does not have living will.

## 2015-04-17 ENCOUNTER — Encounter: Payer: Self-pay | Admitting: Oncology

## 2015-04-17 DIAGNOSIS — C50911 Malignant neoplasm of unspecified site of right female breast: Secondary | ICD-10-CM | POA: Insufficient documentation

## 2015-04-17 NOTE — Progress Notes (Signed)
[facility] [place] @ [place] [phone]  Fax:([phone]) [place]: Sep 14, 1941  MR#: [id number]  [facility]#:[id number]  Patient Care Team: Marguerita Merles, MD as PCP - General (Family Medicine)  CHIEF COMPLAINT:  Chief Complaint  Patient presents with  . Follow-up    Oncology History   1.abnormal mammogram dated January, 2014.  Her right breast approximately 1.2 x 0.7 cm palpable abnormality confirmed on ultrasound  Biopsy was consistent with invasive mammary carcinoma.  Estrogen receptor, progesterone receptor, HER-2/neu receptor pending.Marland Kitchen 2.in 1992 patient had carcinoma of right breast (Lesion  at  3 o'clock) had lumpectomy axillary node dissection  axillary  nodes are reported to be negative.  Patient had radiation therapy followed by 5 years of tamoxifen. 3.her right breast mastectomy(27th of March, 2014) pT1c Pnx MO     Estrogen receptor IHC: Positive, 90% strong staining.         Progesterone receptor: Negative, 0% staining.         HER2-neu FISH: NOT amplified, HER2/CEP17 ratio 1.11.  Oncotype   DX score was suggestive of 11% recurrent disease or pedal next 10 years with anti-hormone therapy 3.Patient was started on letrozole but di     Cancer of right breast   04/17/2015 Initial Diagnosis Cancer of right breast    No flowsheet data found.  INTERVAL HISTORY:  74 year old lady came today for further follow-up regarding carcinoma of right breast.  On tamoxifen.  Tolerating treatment very well.  No hot flashes.  No bony pains Appetite remains stable   REVIEW OF SYSTEMS:       GENERAL:  Feels good.  Active.  No fevers, sweats or weight loss. PERFORMANCE STATUS (ECOG): 01 HEENT:  No visual changes, runny nose, sore throat, mouth sores or tenderness. Lungs: No shortness of breath or cough.  No hemoptysis. Cardiac:  No chest pain, palpitations, orthopnea, or PND. GI:  No nausea, vomiting, diarrhea, constipation, melena or hematochezia. GU:  No urgency,  frequency, dysuria, or hematuria. Musculoskeletal:  No back pain.  No joint pain.  No muscle tenderness. Extremities:  No pain or swelling. Skin:  No rashes or skin changes. Neuro:  No headache, numbness or weakness, balance or coordination issues. Endocrine:  No diabetes, thyroid issues, hot flashes or night sweats. Psych:  No mood changes, depression or anxiety. Pain:  No focal pain. Review of systems:  All other systems reviewed and found to be negative. As per HPI. Otherwise, a complete review of systems is negatve.  PAST MEDICAL HISTORY: Past Medical History  Diagnosis Date  . Cancer of right breast 04/17/2015     HTN:    arthritis:    Diabetes Mellitus, Type II (NIDD):    hx of kidney stones:    breast cancer:    vaginal hysterectomy:    gallbladder removed: 1993   right shoulder surgery: 2004   right ankle surgery: 2004   Tumor removed on right breast and radiation given:   Preventive Screening:  Has patient had any of the following test? Colonscopy  Mammography (1)   Last Colonoscopy: 2009(1)   Last Mammography: Jan 2015(1)   Smoking History: Smoking History Never Smoked.(1)  PFSH: Family History: noncontributory  Social History: negative alcohol, negative tobacco  Additional Past Medical and Surgical History: As mentioned above   ADVANCED DIRECTIVES:  Patient does not have any advanced healthcare directive. Information has been given. HEALTH MAINTENANCE: History  Substance Use Topics  . Smoking status: Not on file  .  Smokeless tobacco: Not on file  . Alcohol Use: Not on file      Allergies  Allergen Reactions  . Other Anaphylaxis    Anesthisia has been a health issue for her in the past.   . Sulfa Antibiotics Rash    Current Outpatient Prescriptions  Medication Sig Dispense Refill  . HYDROcodone-acetaminophen (NORCO/VICODIN) 5-325 MG per tablet Take by mouth.    . levothyroxine (SYNTHROID, LEVOTHROID) 50 MCG tablet Take by mouth.    Marland Kitchen  lisinopril-hydrochlorothiazide (PRINZIDE,ZESTORETIC) 20-12.5 MG per tablet     . tamoxifen (NOLVADEX) 20 MG tablet     . alendronate (FOSAMAX) 70 MG tablet Take by mouth.    Marland Kitchen atenolol (TENORMIN) 50 MG tablet Take by mouth.    Marland Kitchen gemfibrozil (LOPID) 600 MG tablet Take by mouth.    . metFORMIN (GLUCOPHAGE) 500 MG tablet Take by mouth.    . Vitamin D, Ergocalciferol, (DRISDOL) 50000 UNITS CAPS capsule Take by mouth.     No current facility-administered medications for this visit.    OBJECTIVE:  Filed Vitals:   04/13/15 1235  BP: 123/74  Pulse: 71  Temp: 95.7 F (35.4 C)     There is no height on file to calculate BMI.    ECOG FS:1 - Symptomatic but completely ambulatory  PHYSICAL EXAM: General  status: Performance status is good.  Patient has not lost significant weight HEENT: No evidence of stomatitis. Sclera and conjunctivae :: No jaundice.   pale looking. Lungs: Air  entry equal on both sides.  No rhonchi.  No rales.  Cardiac: Heart sounds are normal.  No pericardial rub.  No murmur. Lymphatic system: Cervical, axillary, inguinal, lymph nodes not palpable GI: Abdomen is soft.  No ascites.  Liver spleen not palpable.  No tenderness.  Bowel sounds are within normal limit Lower extremity: No edema Neurological system: Higher functions, cranial nerves intact no evidence of peripheral neuropathy. Skin: No rash.  No ecchymosis.. Examination of the right breast no evidence of recurrent disease . Status post mastectomy l Left breast: Free of masses LAB RESULTS:  Appointment on 04/13/2015  Component Date Value Ref Range Status  . WBC 04/13/2015 9.7  3.6 - 11.0 K/uL Final  . RBC 04/13/2015 4.16  3.80 - 5.20 MIL/uL Final  . Hemoglobin 04/13/2015 11.8* 12.0 - 16.0 g/dL Final  . HCT 04/13/2015 37.0  35.0 - 47.0 % Final  . MCV 04/13/2015 88.9  80.0 - 100.0 fL Final  . MCH 04/13/2015 28.5  26.0 - 34.0 pg Final  . MCHC 04/13/2015 32.0  32.0 - 36.0 g/dL Final  . RDW 04/13/2015 14.8* 11.5 -  14.5 % Final  . Platelets 04/13/2015 284  150 - 440 K/uL Final  . Neutrophils Relative % 04/13/2015 65   Final  . Neutro Abs 04/13/2015 6.3  1.4 - 6.5 K/uL Final  . Lymphocytes Relative 04/13/2015 26   Final  . Lymphs Abs 04/13/2015 2.5  1.0 - 3.6 K/uL Final  . Monocytes Relative 04/13/2015 6   Final  . Monocytes Absolute 04/13/2015 0.5  0.2 - 0.9 K/uL Final  . Eosinophils Relative 04/13/2015 2   Final  . Eosinophils Absolute 04/13/2015 0.2  0 - 0.7 K/uL Final  . Basophils Relative 04/13/2015 1   Final  . Basophils Absolute 04/13/2015 0.1  0 - 0.1 K/uL Final  . Sodium 04/13/2015 133* 135 - 145 mmol/L Final  . Potassium 04/13/2015 4.0  3.5 - 5.1 mmol/L Final  . Chloride 04/13/2015 100* 101 - 111  mmol/L Final  . CO2 04/13/2015 26  22 - 32 mmol/L Final  . Glucose, Bld 04/13/2015 104* 65 - 99 mg/dL Final  . BUN 04/13/2015 36* 6 - 20 mg/dL Final  . Creatinine, Ser 04/13/2015 1.27* 0.44 - 1.00 mg/dL Final  . Calcium 04/13/2015 8.3* 8.9 - 10.3 mg/dL Final  . Total Protein 04/13/2015 6.9  6.5 - 8.1 g/dL Final  . Albumin 04/13/2015 3.8  3.5 - 5.0 g/dL Final  . AST 04/13/2015 26  15 - 41 U/L Final  . ALT 04/13/2015 18  14 - 54 U/L Final  . Alkaline Phosphatase 04/13/2015 28* 38 - 126 U/L Final  . Total Bilirubin 04/13/2015 0.4  0.3 - 1.2 mg/dL Final  . GFR calc non Af Amer 04/13/2015 41* >60 mL/min Final  . GFR calc Af Amer 04/13/2015 48* >60 mL/min Final   Comment: (NOTE) The eGFR has been calculated using the CKD EPI equation. This calculation has not been validated in all clinical situations. eGFR's persistently <60 mL/min signify possible Chronic Kidney Disease.   . Anion gap 04/13/2015 7  5 - 15 Final     STUDIES: No results found.  ASSESSMENT: Carcinoma of right breast.  All Data has been reviewed.  No evidence of recurrent or progressive disease  MEDICAL DECISION MAKING:  Continue tamoxifen Patient expressed understanding and was in agreement with this plan. She also  understands that She can call clinic at any time with any questions, concerns, or complaints.    Cancer of right breast   Staging form: Breast, AJCC 7th Edition     Clinical: Stage IA (T1c, N0, M0) - Marni Griffon, MD   04/17/2015 5:53 AM

## 2015-09-19 ENCOUNTER — Other Ambulatory Visit: Payer: Self-pay | Admitting: Preventative Medicine

## 2015-09-19 ENCOUNTER — Ambulatory Visit
Admission: RE | Admit: 2015-09-19 | Discharge: 2015-09-19 | Disposition: A | Discharge status: Home / Self Care | Payer: Medicare HMO | Source: Ambulatory Visit | Attending: Preventative Medicine | Admitting: Preventative Medicine

## 2015-09-19 DIAGNOSIS — L03115 Cellulitis of right lower limb: Secondary | ICD-10-CM

## 2015-09-19 DIAGNOSIS — Z9889 Other specified postprocedural states: Secondary | ICD-10-CM | POA: Insufficient documentation

## 2015-10-11 ENCOUNTER — Encounter: Payer: Medicare HMO | Attending: Surgery | Admitting: Surgery

## 2015-10-11 DIAGNOSIS — E11622 Type 2 diabetes mellitus with other skin ulcer: Secondary | ICD-10-CM | POA: Insufficient documentation

## 2015-10-11 DIAGNOSIS — L97312 Non-pressure chronic ulcer of right ankle with fat layer exposed: Secondary | ICD-10-CM | POA: Diagnosis not present

## 2015-10-11 DIAGNOSIS — I10 Essential (primary) hypertension: Secondary | ICD-10-CM | POA: Diagnosis not present

## 2015-10-11 DIAGNOSIS — M1990 Unspecified osteoarthritis, unspecified site: Secondary | ICD-10-CM | POA: Insufficient documentation

## 2015-10-11 DIAGNOSIS — E785 Hyperlipidemia, unspecified: Secondary | ICD-10-CM | POA: Insufficient documentation

## 2015-10-11 DIAGNOSIS — Z853 Personal history of malignant neoplasm of breast: Secondary | ICD-10-CM | POA: Diagnosis not present

## 2015-10-11 DIAGNOSIS — I890 Lymphedema, not elsewhere classified: Secondary | ICD-10-CM | POA: Insufficient documentation

## 2015-10-11 NOTE — Progress Notes (Addendum)
ASHKA, KHAYAT ([id number]) Visit Report for 10/11/2015 Allergy List Details Patient Name: [name], [name]. Date of Service: 10/11/2015 1:30 PM Medical Record Number: [id number] Patient Account Number: [id number] Date of Birth/Sex: 24-Mar-1941 (74 y.o. Female) Treating RN: Baruch Gouty, RN, BSN, Velva Harman Primary Care Physician: Delight Stare Other Clinician: Referring Physician: Delight Stare Treating Physician/Extender: Frann Rider in Treatment: 0 Allergies Active Allergies sulfa Allergy Notes Electronic Signature(s) Signed: 10/11/2015 1:06:51 PM By: Regan Lemming BSN, RN Entered By: Regan Lemming on 10/11/2015 13:06:51 [place], [name] Chard ([id number]) -------------------------------------------------------------------------------- [place] Details Patient Name: [name]. Date of Service: 10/11/2015 1:30 PM Medical Record Number: [id number] Patient Account Number: [id number] Date of Birth/Sex: [birth year] (74 y.o. Female) Treating RN: Afful, RN, BSN, Velva Harman Primary Care Physician: Delight Stare Other Clinician: Referring Physician: Delight Stare Treating Physician/Extender: Frann Rider in Treatment: 0 Visit Information Patient Arrived: Cane Arrival Time: 13:26 Accompanied By: SPOuse Transfer Assistance: None Patient Identification Verified: Yes Secondary Verification Process Yes Completed: Patient Requires Transmission- No Based Precautions: Patient Has Alerts: Yes Patient Alerts: Patient on Blood Thinner Aspirin Electronic Signature(s) Signed: 10/11/2015 1:29:54 PM By: Regan Lemming BSN, RN Previous Signature: 10/11/2015 1:29:18 PM Version By: Regan Lemming BSN, RN Entered By: Regan Lemming on 10/11/2015 13:29:54 [place], [name] Chard ([id number]) -------------------------------------------------------------------------------- Clinic Level of Care Assessment Details Patient Name: [name]. Date of Service: 10/11/2015 1:30 PM Medical  Record Number: [id number] Patient Account Number: [id number] Date of Birth/Sex: 1941/07/01 (74 y.o. Female) Treating RN: Afful, RN, BSN, Velva Harman Primary Care Physician: Delight Stare Other Clinician: Referring Physician: Delight Stare Treating Physician/Extender: Frann Rider in Treatment: 0 Clinic Level of Care Assessment Items TOOL 2 Quantity Score []  - Use when only an EandM is performed on the INITIAL visit 0 ASSESSMENTS - Nursing Assessment / Reassessment X - General Physical Exam (combine w/ comprehensive assessment (listed just 1 20 below) when performed on new pt. evals) X - Comprehensive Assessment (HX, ROS, Risk Assessments, Wounds Hx, etc.) 1 25 ASSESSMENTS - Wound and Skin Assessment / Reassessment []  - Simple Wound Assessment / Reassessment - one wound 0 X - Complex Wound Assessment / Reassessment - multiple wounds 2 5 []  - Dermatologic / Skin Assessment (not related to wound area) 0 ASSESSMENTS - Ostomy and/or Continence Assessment and Care []  - Incontinence Assessment and Management 0 []  - Ostomy Care Assessment and Management (repouching, etc.) 0 PROCESS - Coordination of Care X - Simple Patient / Family Education for ongoing care 1 15 []  - Complex (extensive) Patient / Family Education for ongoing care 0 X - Staff obtains Programmer, systems, Records, Test Results / Process Orders 1 10 []  - Staff telephones HHA, Nursing Homes / Clarify orders / etc 0 []  - Routine Transfer to another Facility (non-emergent condition) 0 []  - Routine Hospital Admission (non-emergent condition) 0 []  - New Admissions / Biomedical engineer / Ordering NPWT, Apligraf, etc. 0 []  - Emergency Hospital Admission (emergent condition) 0 X - Simple Discharge Coordination 1 10 [name], [name]. ([id number]) []  - Complex (extensive) Discharge Coordination 0 PROCESS - Special Needs []  - Pediatric / Minor Patient Management 0 []  - Isolation Patient Management 0 []  - Hearing / Language / Visual special  needs 0 []  - Assessment of Community assistance (transportation, D/C planning, etc.) 0 []  - Additional assistance / Altered mentation 0 []  - Support Surface(s) Assessment (bed, cushion, seat, etc.) 0 INTERVENTIONS - Wound Cleansing / Measurement X - Wound Imaging (photographs - any number of wounds) 1 5 []  - Wound Tracing (  instead of photographs) 0 []  - Simple Wound Measurement - one wound 0 X - Complex Wound Measurement - multiple wounds 2 5 []  - Simple Wound Cleansing - one wound 0 []  - Complex Wound Cleansing - multiple wounds 0 INTERVENTIONS - Wound Dressings X - Small Wound Dressing one or multiple wounds 2 10 []  - Medium Wound Dressing one or multiple wounds 0 []  - Large Wound Dressing one or multiple wounds 0 []  - Application of Medications - injection 0 INTERVENTIONS - Miscellaneous []  - External ear exam 0 []  - Specimen Collection (cultures, biopsies, blood, body fluids, etc.) 0 []  - Specimen(s) / Culture(s) sent or taken to Lab for analysis 0 []  - Patient Transfer (multiple staff / Civil Service fast streamer / Similar devices) 0 []  - Simple Staple / Suture removal (25 or less) 0 []  - Complex Staple / Suture removal (26 or more) 0 [name], [name]. ([id number]) []  - Hypo / Hyperglycemic Management (close monitor of Blood Glucose) 0 X - Ankle / Brachial Index (ABI) - do not check if billed separately 1 15 Has the patient been seen at the hospital within the last three years: Yes Total Score: 140 Level Of Care: New/Established - Level 4 Electronic Signature(s) Signed: 10/11/2015 2:11:40 PM By: Regan Lemming BSN, RN Entered By: Regan Lemming on 10/11/2015 14:11:40 [place], [name] Chard ([id number]) -------------------------------------------------------------------------------- Encounter Discharge Information Details Patient Name: [name] Ramus L. Date of Service: 10/11/2015 1:30 PM Medical Record Number: [id number] Patient Account Number: [id number] Date of Birth/Sex: 1941-10-19 (74 y.o.  Female) Treating RN: Afful, RN, BSN, Velva Harman Primary Care Physician: Delight Stare Other Clinician: Referring Physician: Delight Stare Treating Physician/Extender: Frann Rider in Treatment: 0 Encounter Discharge Information Items Discharge Pain Level: 0 Discharge Condition: Stable Ambulatory Status: Cane Discharge Destination: Home Transportation: Private Auto Accompanied By: spouse Schedule Follow-up Appointment: No Medication Reconciliation completed and provided to Patient/Care No Salimatou Simone: Provided on Clinical Summary of Care: 10/11/2015 Form Type Recipient Paper Patient DM Electronic Signature(s) Signed: 10/11/2015 2:23:30 PM By: Ruthine Dose Previous Signature: 10/11/2015 2:15:43 PM Version By: Regan Lemming BSN, RN Entered By: Ruthine Dose on 10/11/2015 14:23:30 [place], [name] Chard ([id number]) -------------------------------------------------------------------------------- Lower Extremity Assessment Details Patient Name: [name] Ramus L. Date of Service: 10/11/2015 1:30 PM Medical Record Number: [id number] Patient Account Number: [id number] Date of Birth/Sex: [birth year] (74 y.o. Female) Treating RN: Afful, RN, BSN, [place] Primary Care Physician: Delight Stare Other Clinician: Referring Physician: Delight Stare Treating Physician/Extender: Frann Rider in Treatment: 0 Edema Assessment Assessed: [Left: No] [Right: No] E[Left: dema] [Right: :] Calf Left: Right: Point of Measurement: 30 cm From Medial Instep 38 cm 38 cm Ankle Left: Right: Point of Measurement: 8 cm From Medial Instep 24 cm 22 cm Vascular Assessment Claudication: Claudication Assessment [Left:None] [Right:None] Pulses: Posterior Tibial Palpable: [Left:Yes] [Right:Yes] Doppler: [Left:Multiphasic] [Right:Multiphasic] Dorsalis Pedis Palpable: [Left:Yes] [Right:Yes] Doppler: [Left:Multiphasic] [Right:Multiphasic] Extremity colors, hair growth, and conditions: Extremity Color:  [Left:Normal] [Right:Normal] Hair Growth on Extremity: [Left:No] [Right:No] Temperature of Extremity: [Left:Warm] [Right:Warm] Capillary Refill: [Left:< 3 seconds] [Right:< 3 seconds] Blood Pressure: Brachial: [Left:138] Dorsalis Pedis: 128 [Left:Dorsalis Pedis: 126] Ankle: Posterior Tibial: 142 [Left:Posterior Tibial: 138 1.03] [Right:1.00] Toe Nail Assessment Left: Right: Thick: Yes Yes Discolored: No No [place], [name]. ([id number]) Deformed: No No Improper Length and Hygiene: No No Electronic Signature(s) Signed: 10/11/2015 1:50:41 PM By: Regan Lemming BSN, RN Entered By: Regan Lemming on 10/11/2015 13:50:41 [name], [name] Chard ([id number]) -------------------------------------------------------------------------------- Multi Wound Chart Details Patient Name: [name] Ramus L. Date of Service: 10/11/2015 1:30 PM Medical  Record Number: [id number] Patient Account Number: [id number] Date of Birth/Sex: 1941/11/02 (74 y.o. Female) Treating RN: Baruch Gouty, RN, BSN, Velva Harman Primary Care Physician: Delight Stare Other Clinician: Referring Physician: Delight Stare Treating Physician/Extender: Frann Rider in Treatment: 0 Vital Signs Height(in): 63 Pulse(bpm): 78 Weight(lbs): 146 Blood Pressure 138/70 (mmHg): Body Mass Index(BMI): 26 Temperature(F): 98.2 Respiratory Rate 16 (breaths/min): Photos: [1:No Photos] [2:No Photos] [N/A:N/A] Wound Location: [1:Right Malleolus - Lateral, Proximal] [2:Right Malleolus - Lateral, Distal] [N/A:N/A] Wounding Event: [1:Gradually Appeared] [2:Gradually Appeared] [N/A:N/A] Primary Etiology: [1:Cellulitis] [2:Cellulitis] [N/A:N/A] Comorbid History: [1:Cataracts, Hypertension, Type II Diabetes, Osteoarthritis] [2:Cataracts, Hypertension, Type II Diabetes, Osteoarthritis] [N/A:N/A] Date Acquired: [1:09/11/2015] [2:09/11/2015] [N/A:N/A] Weeks of Treatment: [1:0] [2:0] [N/A:N/A] Wound Status: [1:Open] [2:Open] [N/A:N/A] Measurements L x W x D  0.8x0.5x0.1 [2:0.6x0.2x0.1] [N/A:N/A] (cm) Area (cm) : [1:0.314] [2:0.094] [N/A:N/A] Volume (cm) : [1:0.031] [2:0.009] [N/A:N/A] % Reduction in Area: [1:N/A] [2:0.00%] [N/A:N/A] % Reduction in Volume: N/A [2:0.00%] [N/A:N/A] Classification: [1:Partial Thickness] [2:Partial Thickness] [N/A:N/A] HBO Classification: [1:Grade 1] [2:Grade 1] [N/A:N/A] Exudate Amount: [1:Small] [2:Small] [N/A:N/A] Exudate Type: [1:Serous] [2:Serous] [N/A:N/A] Exudate Color: [1:amber] [2:amber] [N/A:N/A] Wound Margin: [1:Distinct, outline attached] [2:Distinct, outline attached] [N/A:N/A] Granulation Amount: [1:Medium (34-66%)] [2:Large (67-100%)] [N/A:N/A] Granulation Quality: [1:Pink, Pale] [2:N/A] [N/A:N/A] Necrotic Amount: [1:Small (1-33%)] [2:N/A] [N/A:N/A] Exposed Structures: [1:Fascia: No Fat: No Tendon: No Muscle: No] [2:Fascia: No Fat: No Tendon: No Muscle: No] [N/A:N/A] Joint: No Joint: No Bone: No Bone: No Limited to Skin Limited to Skin Breakdown Breakdown Epithelialization: N/A None N/A Periwound Skin Texture: Edema: Yes Edema: Yes N/A Excoriation: No Excoriation: No Induration: No Induration: No Callus: No Callus: No Crepitus: No Crepitus: No Fluctuance: No Fluctuance: No Friable: No Friable: No Rash: No Rash: No Scarring: No Scarring: No Periwound Skin Moist: Yes Moist: Yes N/A Moisture: Maceration: No Maceration: No Dry/Scaly: No Dry/Scaly: No Periwound Skin Color: Atrophie Blanche: No Atrophie Blanche: No N/A Cyanosis: No Cyanosis: No Ecchymosis: No Ecchymosis: No Erythema: No Erythema: No Hemosiderin Staining: No Hemosiderin Staining: No Mottled: No Mottled: No Pallor: No Pallor: No Rubor: No Rubor: No Temperature: No Abnormality No Abnormality N/A Tenderness on No No N/A Palpation: Wound Preparation: Ulcer Cleansing: Ulcer Cleansing: N/A Rinsed/Irrigated with Rinsed/Irrigated with Saline Saline Topical Anesthetic Topical Anesthetic Applied:  Other: lidocaine Applied: None 4% Treatment Notes Electronic Signature(s) Signed: 10/11/2015 2:09:45 PM By: Regan Lemming BSN, RN Entered By: Regan Lemming on 10/11/2015 14:09:45 [place], [name] Chard ([id number]) -------------------------------------------------------------------------------- [place] Details Patient Name: [name]. Date of Service: 10/11/2015 1:30 PM Medical Record Number: [id number] Patient Account Number: [id number] Date of Birth/Sex: 1941-01-21 (74 y.o. Female) Treating RN: Afful, RN, BSN, Velva Harman Primary Care Physician: Delight Stare Other Clinician: Referring Physician: Delight Stare Treating Physician/Extender: Frann Rider in Treatment: 0 Active Inactive Orientation to the Wound Care Program Nursing Diagnoses: Knowledge deficit related to the wound healing center program Goals: Patient/caregiver will verbalize understanding of the [place] Program Date Initiated: 10/11/2015 Goal Status: Active Interventions: Provide education on orientation to the wound center Notes: Wound/Skin Impairment Nursing Diagnoses: Impaired tissue integrity Knowledge deficit related to ulceration/compromised skin integrity Goals: Patient/caregiver will verbalize understanding of skin care regimen Date Initiated: 10/11/2015 Goal Status: Active Ulcer/skin breakdown will have a volume reduction of 30% by week 4 Date Initiated: 10/11/2015 Goal Status: Active Ulcer/skin breakdown will have a volume reduction of 50% by week 8 Date Initiated: 10/11/2015 Goal Status: Active Ulcer/skin breakdown will have a volume reduction of 80% by week 12 Date Initiated: 10/11/2015 Goal Status: Active Ulcer/skin breakdown will heal  within 14 weeks Date Initiated: 10/11/2015 ROSALYNN, [name] ([id number]) Goal Status: Active Interventions: Assess patient/caregiver ability to obtain necessary supplies Assess patient/caregiver ability to perform  ulcer/skin care regimen upon admission and as needed Assess ulceration(s) every visit Provide education on ulcer and skin care Treatment Activities: Referred to DME Rosa Wyly for dressing supplies : 10/11/2015 Skin care regimen initiated : 10/11/2015 Topical wound management initiated : 10/11/2015 Notes: Electronic Signature(s) Signed: 10/11/2015 2:09:09 PM By: Regan Lemming BSN, RN Entered By: Regan Lemming on 10/11/2015 14:09:08 [place], [name] Chard ([id number]) -------------------------------------------------------------------------------- Pain Assessment Details Patient Name: [name] Ramus L. Date of Service: 10/11/2015 1:30 PM Medical Record Number: [id number] Patient Account Number: [id number] Date of Birth/Sex: [birth year] (74 y.o. Female) Treating RN: Baruch Gouty, RN, BSN, Velva Harman Primary Care Physician: Delight Stare Other Clinician: Referring Physician: Delight Stare Treating Physician/Extender: Frann Rider in Treatment: 0 Active Problems Location of Pain Severity and Description of Pain Patient Has Paino No Site Locations Pain Management and Medication Current Pain Management: Electronic Signature(s) Signed: 10/11/2015 1:30:14 PM By: Regan Lemming BSN, RN Entered By: Regan Lemming on 10/11/2015 13:30:14 [place], [name] Chard ([id number]) -------------------------------------------------------------------------------- Patient/Caregiver Education Details Patient Name: [name] Ramus L. Date of Service: 10/11/2015 1:30 PM Medical Record Number: [id number] Patient Account Number: [id number] Date of Birth/Gender: November 03, 1941 (74 y.o. Female) Treating RN: Baruch Gouty, RN, BSN, Velva Harman Primary Care Physician: Delight Stare Other Clinician: Referring Physician: Delight Stare Treating Physician/Extender: Frann Rider in Treatment: 0 Education Assessment Education Provided To: Patient Education Topics Provided Welcome To [facility]: Methods: Explain/Verbal Responses:  State content correctly Wound/Skin Impairment: Methods: Explain/Verbal Responses: State content correctly Electronic Signature(s) Signed: 10/11/2015 2:15:54 PM By: Regan Lemming BSN, RN Entered By: Regan Lemming on 10/11/2015 14:15:54 [place], [name] Chard ([id number]) -------------------------------------------------------------------------------- Wound Assessment Details Patient Name: [name] Ramus L. Date of Service: 10/11/2015 1:30 PM Medical Record Number: [id number] Patient Account Number: [id number] Date of Birth/Sex: [birth year] (74 y.o. Female) Treating RN: Afful, RN, BSN, Velva Harman Primary Care Physician: Delight Stare Other Clinician: Referring Physician: Delight Stare Treating Physician/Extender: Frann Rider in Treatment: 0 Wound Status Wound Number: 1 Primary Cellulitis Etiology: Wound Location: Right Malleolus - Lateral, Proximal Wound Open Status: Wounding Event: Gradually Appeared Comorbid Cataracts, Hypertension, Type II Date Acquired: 09/11/2015 History: Diabetes, Osteoarthritis Weeks Of Treatment: 0 Clustered Wound: No Photos Wound Measurements Length: (cm) 0.8 Width: (cm) 0.5 Depth: (cm) 0.1 Area: (cm) 0.314 Volume: (cm) 0.031 % Reduction in Area: 0% % Reduction in Volume: 0% Tunneling: No Undermining: No Wound Description Classification: Partial Thickness Diabetic Severity (Wagner): Grade 1 Wound Margin: Distinct, outline attache Exudate Amount: Small Exudate Type: Serous [name], [name]. ([id number]) Foul Odor After Cleansing: No d Exudate Color: amber Wound Bed Granulation Amount: Medium (34-66%) Exposed Structure Granulation Quality: Pink, Pale Fascia Exposed: No Necrotic Amount: Small (1-33%) Fat Layer Exposed: No Necrotic Quality: Adherent Slough Tendon Exposed: No Muscle Exposed: No Joint Exposed: No Bone Exposed: No Limited to Skin Breakdown Periwound Skin Texture Texture Color No Abnormalities Noted: No No  Abnormalities Noted: No Callus: No Atrophie Blanche: No Crepitus: No Cyanosis: No Excoriation: No Ecchymosis: No Fluctuance: No Erythema: No Friable: No Hemosiderin Staining: No Induration: No Mottled: No Localized Edema: Yes Pallor: No Rash: No Rubor: No Scarring: No Temperature / Pain Moisture Temperature: No Abnormality No Abnormalities Noted: No Dry / Scaly: No Maceration: No Moist: Yes Wound Preparation Ulcer Cleansing: Rinsed/Irrigated with Saline Topical Anesthetic Applied: Other: lidocaine 4%, Treatment Notes Wound #1 (Right, Proximal, Lateral Malleolus) 1. Cleansed with: Cleanse wound with  antibacterial soap and water 3. Peri-wound Care: Moisturizing lotion 4. Dressing Applied: Aquacel Ag 5. Secondary Dressing Applied ABD Pad 7. Secured with 2 Layer Compression System - Right Lower Extremity [name], [name] ([id number]) Electronic Signature(s) Signed: 10/12/2015 5:39:02 PM By: Regan Lemming BSN, RN Entered By: Regan Lemming on 10/12/2015 17:39:02 [place], [name] Chard ([id number]) -------------------------------------------------------------------------------- Wound Assessment Details Patient Name: [name] Ramus L. Date of Service: 10/11/2015 1:30 PM Medical Record Number: [id number] Patient Account Number: [id number] Date of Birth/Sex: [birth year] (74 y.o. Female) Treating RN: Afful, RN, BSN, Velva Harman Primary Care Physician: Delight Stare Other Clinician: Referring Physician: Delight Stare Treating Physician/Extender: Frann Rider in Treatment: 0 Wound Status Wound Number: 2 Primary Cellulitis Etiology: Wound Location: Right Malleolus - Lateral, Distal Wound Open Wounding Event: Gradually Appeared Status: Date Acquired: 09/11/2015 Comorbid Cataracts, Hypertension, Type II Weeks Of Treatment: 0 History: Diabetes, Osteoarthritis Clustered Wound: No Photos Wound Measurements Length: (cm) 0.6 Width: (cm) 0.2 Depth: (cm) 0.1 Area: (cm)  0.094 Volume: (cm) 0.009 % Reduction in Area: 0% % Reduction in Volume: 0% Epithelialization: None Tunneling: No Undermining: No Wound Description Classification: Partial Thickness Diabetic Severity (Wagner): Grade 1 Wound Margin: Distinct, outline attache Exudate Amount: Small Exudate Type: Serous [name], [name]. ([id number]) Foul Odor After Cleansing: No d Exudate Color: amber Wound Bed Granulation Amount: Large (67-100%) Exposed Structure Fascia Exposed: No Fat Layer Exposed: No Tendon Exposed: No Muscle Exposed: No Joint Exposed: No Bone Exposed: No Limited to Skin Breakdown Periwound Skin Texture Texture Color No Abnormalities Noted: No No Abnormalities Noted: No Callus: No Atrophie Blanche: No Crepitus: No Cyanosis: No Excoriation: No Ecchymosis: No Fluctuance: No Erythema: No Friable: No Hemosiderin Staining: No Induration: No Mottled: No Localized Edema: Yes Pallor: No Rash: No Rubor: No Scarring: No Temperature / Pain Moisture Temperature: No Abnormality No Abnormalities Noted: No Dry / Scaly: No Maceration: No Moist: Yes Wound Preparation Ulcer Cleansing: Rinsed/Irrigated with Saline Topical Anesthetic Applied: None Treatment Notes Wound #2 (Right, Distal, Lateral Malleolus) 1. Cleansed with: Cleanse wound with antibacterial soap and water 3. Peri-wound Care: Moisturizing lotion 4. Dressing Applied: Aquacel Ag 5. Secondary Dressing Applied ABD Pad 7. Secured with 2 Layer Compression System - Right Lower Extremity [name], [name] ([id number]) Electronic Signature(s) Signed: 10/12/2015 5:39:36 PM By: Regan Lemming BSN, RN Previous Signature: 10/11/2015 2:01:07 PM Version By: Regan Lemming BSN, RN Entered By: Regan Lemming on 10/12/2015 17:39:36 [name], [name] Chard ([id number]) -------------------------------------------------------------------------------- Vitals Details Patient Name: [name] Ramus L. Date of Service:  10/11/2015 1:30 PM Medical Record Number: [id number] Patient Account Number: [id number] Date of Birth/Sex: 10/09/1941 (74 y.o. Female) Treating RN: Afful, RN, BSN, Velva Harman Primary Care Physician: Delight Stare Other Clinician: Referring Physician: Delight Stare Treating Physician/Extender: Frann Rider in Treatment: 0 Vital Signs Time Taken: 13:40 Temperature (F): 98.2 Height (in): 63 Pulse (bpm): 78 Source: Stated Respiratory Rate (breaths/min): 16 Weight (lbs): 146 Blood Pressure (mmHg): 138/70 Source: Measured Reference Range: 80 - 120 mg / dl Body Mass Index (BMI): 25.9 Electronic Signature(s) Signed: 10/11/2015 1:40:51 PM By: Regan Lemming BSN, RN Entered By: Regan Lemming on 10/11/2015 13:40:51

## 2015-10-11 NOTE — Progress Notes (Signed)
[name], [name] ([id number]) Visit Report for 10/11/2015 Abuse/Suicide Risk Screen Details Patient Name: [name], [name] 10/11/2015 1:30 Date of Service: PM Medical Record [id number] Number: Patient Account Number: [id number] December 29, 1940 (74 y.o. Treating RN: [name], [name] Date of Birth/Sex: Female) Other Clinician: Primary Care Physician: [name] Treating [name] Referring Physician: Delight [name] Physician/Extender: [name]: 0 Abuse/Suicide Risk Screen Items Answer ABUSE/SUICIDE RISK SCREEN: Has anyone close to you tried to hurt or harm you recentlyo No Do you feel uncomfortable with anyone in your familyo No Has anyone forced you do things that you didnot want to doo No Do you have any thoughts of harming yourselfo No Patient displays signs or symptoms of abuse and/or neglect. No Electronic Signature(s) Signed: 10/11/2015 1:30:32 PM By: [name] BSN, RN Entered By: [name] on 10/11/2015 13:30:32 [name] ([id number]) -------------------------------------------------------------------------------- Activities of Daily Living Details Patient Name: [name], [name] 10/11/2015 1:30 Date of Service: PM Medical Record [id number] Number: Patient Account Number: [id number] Jul 03, 1941 (74 y.o. Treating RN: [name], [name] Date of Birth/Sex: Female) Other Clinician: Primary Care Physician: [name] Treating [name] Referring Physician: Delight [name] Physician/Extender: [name]: 0 Activities of Daily Living Items Answer Activities of Daily Living (Please select one for each item) Drive Automobile Completely Able Take Medications Completely Able Use Telephone Completely Able Care for Appearance Completely Able Use Toilet Completely Able Bath / Shower Completely Able Dress [name] Completely Able Feed [name] Completely Able Walk Completely Able Get In / Out Bed Completely Able Housework Completely  Able Prepare Meals Completely [name] Completely Able Electronic Signature(s) Signed: 10/11/2015 1:30:59 PM By: [name] BSN, RN Entered By: [name] on 10/11/2015 13:30:58 [name] ([id number]) -------------------------------------------------------------------------------- Education Assessment Details Patient Name: [name], [name] 10/11/2015 1:30 Date of Service: PM Medical Record [id number] Number: Patient Account Number: [id number] 02-04-1941 (74 y.o. Treating RN: [name], [name] Date of Birth/Sex: Female) Other Clinician: Primary Care Physician: [name] Treating [name] Referring Physician: Delight [name] Physician/Extender: [name] in [name]: 0 Primary Learner Assessed: Patient Learning Preferences/Education Level/Primary Language Learning Preference: Explanation Highest Education Level: College or Above Preferred Language: English Cognitive Barrier Assessment/Beliefs Language Barrier: No Physical Barrier Assessment Impaired Vision: No Impaired Hearing: No Decreased Hand dexterity: No Knowledge/Comprehension Assessment Knowledge Level: High Comprehension Level: High Ability to understand written High instructions: Ability to understand verbal High instructions: Motivation Assessment Anxiety Level: Calm Cooperation: Cooperative Education Importance: Acknowledges Need Interest in Health Problems: Asks Questions Perception: Coherent Willingness to Engage in [name]- High Management Activities: Readiness to Engage in [name]- High Management Activities: Electronic Signature(s) Signed: 10/11/2015 1:31:34 PM By: [name] BSN, RN Entered By: [name] on 10/11/2015 13:31:34 [name] ([id number]) [name] ([id number]) -------------------------------------------------------------------------------- Fall Risk Assessment Details Patient Name: [name] Nims.  10/11/2015 1:30 Date of Service: PM Medical Record [id number] Number: Patient Account Number: [id number] 17-Aug-1941 (74 y.o. Treating RN: [name], [name] Date of Birth/Sex: Female) Other Clinician: Primary Care Physician: [name] Treating [name] Referring Physician: Delight [name] Physician/Extender: [name]: 0 Fall Risk Assessment Items Have you had 2 or more falls in the last 12 monthso 0 No Have you had any fall that resulted in injury in the last 12 monthso 0 No FALL RISK ASSESSMENT: History of falling - immediate or within 3 months 0 No Secondary diagnosis 0 No Ambulatory aid None/bed rest/wheelchair/nurse 0 Yes Crutches/cane/walker 15 Yes Furniture 0 No  IV Access/Saline Lock 0 No Gait/Training Normal/bed rest/immobile 0 Yes Weak 10 Yes Impaired 0 No Mental Status Oriented to own ability 0 Yes Electronic Signature(s) Signed: 10/11/2015 1:31:54 PM By: [name] BSN, RN Entered By: [name] on 10/11/2015 13:31:54 [name] ([id number]) -------------------------------------------------------------------------------- Foot Assessment Details Patient Name: [name], [name] 10/11/2015 1:30 Date of Service: PM Medical Record [id number] Number: Patient Account Number: [id number] Sep 18, 1941 (74 y.o. Treating RN: [name], [name] Date of Birth/Sex: Female) Other Clinician: Primary Care Physician: [name] Treating [name] Referring Physician: Delight [name] Physician/Extender: [name]: 0 Foot Assessment Items Site Locations + = Sensation present, - = Sensation absent, C = Callus, U = Ulcer R = Redness, W = Warmth, M = Maceration, PU = Pre-ulcerative lesion F = Fissure, S = Swelling, D = Dryness Assessment Right: Left: Other Deformity: No No Prior Foot Ulcer: No No Prior Amputation: No No Charcot Joint: No No Ambulatory Status: Ambulatory With Help Assistance Device: Cane Gait:  Administrator, arts) Signed: 10/11/2015 1:32:18 PM By: [name] BSN, RN Entered By: [name] on 10/11/2015 13:32:18 [name] ([id number]) [name], [place] [name] ([id number]) -------------------------------------------------------------------------------- Nutrition Risk Assessment Details Patient Name: [name], [name] 10/11/2015 1:30 Date of Service: PM Medical Record [id number] Number: Patient Account Number: [id number] July 28, 1941 (74 y.o. Treating RN: [name], [name] Date of Birth/Sex: Female) Other Clinician: Primary Care Physician: [name] Treating [name] Referring Physician: Delight [name] Physician/Extender: [name]: 0 Height (in): Weight (lbs): Body Mass Index (BMI): Nutrition Risk Assessment Items NUTRITION RISK SCREEN: I have an illness or condition that made me change the kind and/or 0 No amount of food I eat I eat fewer than two meals per day 0 No I eat few fruits and vegetables, or milk products 0 No I have three or more drinks of beer, liquor or wine almost every day 0 No I have tooth or mouth problems that make it hard for me to eat 0 No I don't always have enough money to buy the food I need 0 No I eat alone most of the time 0 No I take three or more different prescribed or over-the-counter drugs a 0 No day Without wanting to, I have lost or gained 10 pounds in the last six 0 No months I am not always physically able to shop, cook and/or feed myself 0 No Nutrition Protocols Good Risk Protocol 0 No interventions needed Moderate Risk Protocol Electronic Signature(s) Signed: 10/11/2015 1:32:06 PM By: [name] BSN, RN Entered By: [name] on 10/11/2015 13:32:05

## 2015-10-12 NOTE — Progress Notes (Addendum)
[name], [name] ([id number]) Visit Report for 10/11/2015 Chief Complaint Document Details Patient Name: [name], [name] 10/11/2015 1:30 Date of Service: PM Medical Record [id number] Number: Patient Account Number: [id number] August 25, 1941 (74 y.o. Treating RN: Baruch Gouty, RN, BSN, Velva Harman Date of Birth/Sex: Female) Other Clinician: Primary Care Physician: [name] Treating Jolonda Gomm Referring Physician: Delight Stare Physician/Extender: Weeks in Treatment: 0 Information Obtained from: Patient Chief Complaint Patients presents for treatment of an open diabetic ulcer. the patient has had swelling of both legs for about 3 months and recently developed a redness with pain and ulceration on the right lower extremity for about 3 weeks. Electronic Signature(s) Signed: 10/11/2015 2:44:48 PM By: Christin Fudge MD, FACS Entered By: Christin Fudge on 10/11/2015 14:44:48 [name], [name] ([id number]) -------------------------------------------------------------------------------- HPI Details Patient Name: [name], [name] 10/11/2015 1:30 Date of Service: PM Medical Record [id number] Number: Patient Account Number: [id number] 15-Jan-1941 (74 y.o. Treating RN: Baruch Gouty, RN, BSN, Velva Harman Date of Birth/Sex: Female) Other Clinician: Primary Care Physician: [name] Treating Juniper Snyders Referring Physician: Delight Stare Physician/Extender: Weeks in Treatment: 0 History of Present Illness Location: swelling both lower extremities and ulceration on the right lateral ankle Quality: Patient reports experiencing a dull pain to affected area(s). Severity: Patient states wound (s) are getting better. Duration: Patient has had the wound for < 4 weeks prior to presenting for treatment Timing: Pain in wound is Intermittent (comes and goes Context: The wound appeared gradually over time Modifying Factors: Other treatment(s) tried include:she received amoxicillin and doxycycline and also  has been putting some local bandage. Associated Signs and Symptoms: Patient reports having increase swelling. HPI Description: 74 year old patient who comes with a referral for bilateral lower extremity edema and a lower extremity ulceration and has been sent by her PCP Dr. Placido Sou. I understand the patient was recently put on amoxicillin and doxycycline but could not tolerate the amoxicillin. doxycycline course was completed. a BNP and EKG was supposed to be normal and the patient did not have any dyspnea. the patient has been on a diuretic. The patient was also prescribed a pair of elastic compression stockings of the 20-30 mmHg pressure variety. x-ray of the right ankle was done on 09/20/2015 and showed posttraumatic and postsurgical changes of the right ankle with secondary degenerative changes of the tibiotalar joint and to a lesser degree the subtalar joint. No definite acute bony abnormalities are noted. Past medical history significant for diabetes mellitus, hypertension, hyperlipidemia, right breast cancer treated with a mastectomy in 2014. She has never smoked. Electronic Signature(s) Signed: 10/11/2015 2:48:34 PM By: Christin Fudge MD, FACS Previous Signature: 10/11/2015 1:42:48 PM Version By: Christin Fudge MD, FACS Entered By: Christin Fudge on 10/11/2015 14:48:33 [name], [name] ([id number]) -------------------------------------------------------------------------------- Physical Exam Details Patient Name: [name], [name] 10/11/2015 1:30 Date of Service: PM Medical Record [id number] Number: Patient Account Number: [id number] 20-Apr-1941 (74 y.o. Treating RN: Baruch Gouty, RN, BSN, Velva Harman Date of Birth/Sex: Female) Other Clinician: Primary Care Physician: [name] Treating Jennafer Gladue Referring Physician: Delight Stare Physician/Extender: Weeks in Treatment: 0 Constitutional . Pulse regular. Respirations normal and unlabored. Afebrile. . Eyes Nonicteric. Reactive  to light. Ears, Nose, Mouth, and Throat Lips, teeth, and gums WNL.Marland Kitchen Moist mucosa without lesions . Neck supple and nontender. No palpable supraclavicular or cervical adenopathy. Normal sized without goiter. Respiratory WNL. No retractions.. Breath sounds WNL, No rubs, rales, rhonchi, or wheeze.. Cardiovascular Pedal Pulses WNL. ABI on the right was 1.07 on the left was 1.03.. she has bilateral lower extremity lymphedema stage  I. Lymphatic No adneopathy. No adenopathy. No adenopathy. Musculoskeletal Adexa without tenderness or enlargement.. Digits and nails w/o clubbing, cyanosis, infection, petechiae, ischemia, or inflammatory conditions.. Integumentary (Hair, Skin) No suspicious lesions. No crepitus or fluctuance. No peri-wound warmth or erythema. No masses.Marland Kitchen Psychiatric Judgement and insight Intact.. No evidence of depression, anxiety, or agitation.. Notes the patient has a deformity of the right ankle region due to previous surgical intervention. She has 2 small ulcerations which are shallow and have minimal slough. She also has an area of freshly healed blister with supple scarring. Electronic Signature(s) Signed: 10/11/2015 2:51:48 PM By: Christin Fudge MD, FACS Entered By: Christin Fudge on 10/11/2015 14:51:48 [name], [name] ([id number]) -------------------------------------------------------------------------------- Physician Orders Details Patient Name: [name], [name] 10/11/2015 1:30 Date of Service: PM Medical Record [id number] Number: Patient Account Number: [id number] 05/15/1941 (74 y.o. Treating RN: Baruch Gouty, RN, BSN, Velva Harman Date of Birth/Sex: Female) Other Clinician: Primary Care Physician: [name] Treating Emiline Mancebo Referring Physician: Delight Stare Physician/Extender: Suella [place] in Treatment: 0 Verbal / Phone Orders: Yes Clinician: Afful, RN, BSN, Rita Read Back and Verified: Yes Diagnosis Coding Wound Cleansing Wound #1 Right,Proximal,Lateral  Malleolus o Clean wound with Normal Saline. Wound #2 Right,Distal,Lateral Malleolus o Clean wound with Normal Saline. Skin Barriers/Peri-Wound Care Wound #1 Right,Proximal,Lateral Malleolus o Moisturizing lotion Wound #2 Right,Distal,Lateral Malleolus o Moisturizing lotion Primary Wound Dressing Wound #1 Right,Proximal,Lateral Malleolus o Aquacel Ag Wound #2 Right,Distal,Lateral Malleolus o Aquacel Ag Secondary Dressing Wound #1 Right,Proximal,Lateral Malleolus o ABD pad Wound #2 Right,Distal,Lateral Malleolus o ABD pad Dressing Change Frequency Wound #1 Right,Proximal,Lateral Malleolus o Change dressing every week - Patient will come on Monday because husband is having surgery on tuesday Wound #2 Right,Distal,Lateral Malleolus Gurr, Brittane L. ([id number]) o Change dressing every week - Patient will come on Monday because husband is having surgery on tuesday Follow-up Appointments Wound #1 Right,Proximal,Lateral Malleolus o Return Appointment in 1 week. Wound #2 Right,Distal,Lateral Malleolus o Return Appointment in 1 week. Edema Control Wound #1 Right,Proximal,Lateral Malleolus o 2 Layer Compression System - Right Lower Extremity Wound #2 Right,Distal,Lateral Malleolus o 2 Layer Compression System - Right Lower Extremity Services and Therapies o Venous Studies -Bilateral oooo Electronic Signature(s) Signed: 10/11/2015 2:14:46 PM By: Regan Lemming BSN, RN Signed: 10/11/2015 5:47:05 PM By: Christin Fudge MD, FACS Entered By: Regan Lemming on 10/11/2015 14:14:46 [name], [name] ([id number]) -------------------------------------------------------------------------------- Problem List Details Patient Name: [name], [name] 10/11/2015 1:30 Date of Service: PM Medical Record [id number] Number: Patient Account Number: [id number] 17-Apr-1941 (74 y.o. Treating RN: Baruch Gouty, RN, BSN, Velva Harman Date of Birth/Sex: Female) Other Clinician: Primary  Care Physician: [name] Treating Kaylia Winborne Referring Physician: Delight Stare Physician/Extender: Weeks in Treatment: 0 Active Problems ICD-10 Encounter Code Description Active Date Diagnosis E11.622 Type 2 diabetes mellitus with other skin ulcer 10/11/2015 Yes I89.0 Lymphedema, not elsewhere classified 10/11/2015 Yes L97.312 Non-pressure chronic ulcer of right ankle with fat layer 10/11/2015 Yes exposed Inactive Problems Resolved Problems Electronic Signature(s) Signed: 10/11/2015 2:44:15 PM By: Christin Fudge MD, FACS Entered By: Christin Fudge on 10/11/2015 14:44:15 [name], [name] ([id number]) -------------------------------------------------------------------------------- Progress Note Details Patient Name: [name] Nims. 10/11/2015 1:30 Date of Service: PM Medical Record [id number] Number: Patient Account Number: [id number] April 30, 1941 (74 y.o. Treating RN: Baruch Gouty, RN, BSN, Velva Harman Date of Birth/Sex: Female) Other Clinician: Primary Care Physician: [name] Treating Kalayna Noy Referring Physician: Delight Stare Physician/Extender: Weeks in Treatment: 0 Subjective Chief Complaint Information obtained from Patient Patients presents for treatment of an open diabetic ulcer. the patient has had swelling  of both legs for about 3 months and recently developed a redness with pain and ulceration on the right lower extremity for about 3 weeks. History of Present Illness (HPI) The following HPI elements were documented for the patient's wound: Location: swelling both lower extremities and ulceration on the right lateral ankle Quality: Patient reports experiencing a dull pain to affected area(s). Severity: Patient states wound (s) are getting better. Duration: Patient has had the wound for < 4 weeks prior to presenting for treatment Timing: Pain in wound is Intermittent (comes and goes Context: The wound appeared gradually over time Modifying Factors: Other  treatment(s) tried include:she received amoxicillin and doxycycline and also has been putting some local bandage. Associated Signs and Symptoms: Patient reports having increase swelling. 74 year old patient who comes with a referral for bilateral lower extremity edema and a lower extremity ulceration and has been sent by her PCP Dr. Placido Sou. I understand the patient was recently put on amoxicillin and doxycycline but could not tolerate the amoxicillin. doxycycline course was completed. a BNP and EKG was supposed to be normal and the patient did not have any dyspnea. the patient has been on a diuretic. The patient was also prescribed a pair of elastic compression stockings of the 20-30 mmHg pressure variety. x-ray of the right ankle was done on 09/20/2015 and showed posttraumatic and postsurgical changes of the right ankle with secondary degenerative changes of the tibiotalar joint and to a lesser degree the subtalar joint. No definite acute bony abnormalities are noted. Past medical history significant for diabetes mellitus, hypertension, hyperlipidemia, right breast cancer treated with a mastectomy in 2014. She has never smoked. Wound History Patient reportedly has not tested positive for osteomyelitis. Patient reportedly has had testing performed to evaluate circulation in the legs. Patient experiences the following problems associated with their wounds: swelling. [name], [name] ([id number]) Patient History Information obtained from Patient. Allergies sulfa Family History Heart Disease - Mother, Father, Hypertension - Mother, Father, Stroke - Mother, Father, No family history of Cancer, Diabetes, Hereditary Spherocytosis, Kidney Disease, Lung Disease, Seizures, Thyroid Problems, Tuberculosis. Social History Never smoker, Alcohol Use - Never, Drug Use - No History, Caffeine Use - Daily. Medical History Eyes Patient has history of Cataracts - removed 2 years ago Denies history  of Glaucoma, Optic Neuritis Ear/Nose/Mouth/Throat Denies history of Chronic sinus problems/congestion, Middle ear problems Hematologic/Lymphatic Denies history of Anemia, Hemophilia, Human Immunodeficiency Virus, Lymphedema, Sickle Cell Disease Respiratory Denies history of Aspiration, Asthma, Chronic Obstructive Pulmonary Disease (COPD), Pneumothorax, Sleep Apnea, Tuberculosis Cardiovascular Patient has history of Hypertension Gastrointestinal Denies history of Cirrhosis , Colitis, Crohn s, Hepatitis A, Hepatitis B, Hepatitis C Endocrine Patient has history of Type II Diabetes Musculoskeletal Patient has history of Osteoarthritis Neurologic Denies history of Dementia, Neuropathy, Quadriplegia, Paraplegia, Seizure Disorder Psychiatric Denies history of Anorexia/bulimia, Confinement Anxiety Patient is treated with Oral Agents. Blood sugar is not tested. Blood sugar results noted at the following times: Breakfast - 116. Medical And Surgical History Notes Genitourinary Hx of Kidney stones, hysterectomy Musculoskeletal Rotator cuff disorder Oncologic Breast Ca Review of Systems (ROS) [name], [name]. ([id number]) Constitutional Symptoms (General Health) The patient has no complaints or symptoms. Eyes The patient has no complaints or symptoms. Ear/Nose/Mouth/Throat The patient has no complaints or symptoms. Hematologic/Lymphatic The patient has no complaints or symptoms. Respiratory The patient has no complaints or symptoms. Cardiovascular The patient has no complaints or symptoms. Gastrointestinal The patient has no complaints or symptoms. Endocrine The patient has no complaints or symptoms. Genitourinary  The patient has no complaints or symptoms. Immunological The patient has no complaints or symptoms. Integumentary (Skin) The patient has no complaints or symptoms. Musculoskeletal The patient has no complaints or symptoms. Neurologic The patient has no  complaints or symptoms. Oncologic The patient has no complaints or symptoms. Psychiatric The patient has no complaints or symptoms. Medications lisinopril 20 mg-hydrochlorothiazide 12.5 mg tablet oral tablet oral aspirin 81 mg tablet,delayed release oral tablet,delayed release (DR/EC) oral metformin ER 1,000 mg tablet,extended release 24hr oral tablet extended release 24hr oral Lipitor 40 mg tablet oral tablet oral atenolol 50 mg tablet oral tablet oral Fosamax 70 mg tablet oral tablet oral furosemide 20 mg tablet oral tablet oral ibuprofen 600 mg tablet oral tablet oral tamoxifen 20 mg tablet oral tablet oral levothyroxine 75 mcg tablet oral tablet oral Zostavax (PF) 19,400 unit/0.65 mL subcutaneous suspension subcutaneous suspension for reconstitution subcutaneous cholecalciferol (vitamin D3) 50,000 unit capsule oral capsule oral [name], [name]. ([id number]) Objective Constitutional Pulse regular. Respirations normal and unlabored. Afebrile. Vitals Time Taken: 1:40 PM, Height: 63 in, Source: Stated, Weight: 146 lbs, Source: Measured, BMI: 25.9, Temperature: 98.2 F, Pulse: 78 bpm, Respiratory Rate: 16 breaths/min, Blood Pressure: 138/70 mmHg. Eyes Nonicteric. Reactive to light. Ears, Nose, Mouth, and Throat Lips, teeth, and gums WNL.Marland Kitchen Moist mucosa without lesions . Neck supple and nontender. No palpable supraclavicular or cervical adenopathy. Normal sized without goiter. Respiratory WNL. No retractions.. Breath sounds WNL, No rubs, rales, rhonchi, or wheeze.. Cardiovascular Pedal Pulses WNL. ABI on the right was 1.07 on the left was 1.03.. she has bilateral lower extremity lymphedema stage I. Lymphatic No adneopathy. No adenopathy. No adenopathy. Musculoskeletal Adexa without tenderness or enlargement.. Digits and nails w/o clubbing, cyanosis, infection, petechiae, ischemia, or inflammatory conditions.Marland Kitchen Psychiatric Judgement and insight Intact.. No evidence of  depression, anxiety, or agitation.. General Notes: the patient has a deformity of the right ankle region due to previous surgical intervention. She has 2 small ulcerations which are shallow and have minimal slough. She also has an area of freshly healed blister with supple scarring. Integumentary (Hair, Skin) No suspicious lesions. No crepitus or fluctuance. No peri-wound warmth or erythema. No masses.. Wound #1 status is Open. Original cause of wound was Gradually Appeared. The wound is located on the Right,Proximal,Lateral Malleolus. The wound measures 0.8cm length x 0.5cm width x 0.1cm depth; 0.314cm^2 area and 0.031cm^3 volume. The wound is limited to skin breakdown. There is no tunneling or undermining noted. There is a small amount of serous drainage noted. The wound margin is distinct with Guggenheim, Macel L. ([id number]) the outline attached to the wound base. There is medium (34-66%) pink, pale granulation within the wound bed. There is a small (1-33%) amount of necrotic tissue within the wound bed including Adherent Slough. The periwound skin appearance exhibited: Localized Edema, Moist. The periwound skin appearance did not exhibit: Callus, Crepitus, Excoriation, Fluctuance, Friable, Induration, Rash, Scarring, Dry/Scaly, Maceration, Atrophie Blanche, Cyanosis, Ecchymosis, Hemosiderin Staining, Mottled, Pallor, Rubor, Erythema. Periwound temperature was noted as No Abnormality. Wound #2 status is Open. Original cause of wound was Gradually Appeared. The wound is located on the Right,Distal,Lateral Malleolus. The wound measures 0.6cm length x 0.2cm width x 0.1cm depth; 0.094cm^2 area and 0.009cm^3 volume. The wound is limited to skin breakdown. There is no tunneling or undermining noted. There is a small amount of serous drainage noted. The wound margin is distinct with the outline attached to the wound base. There is large (67-100%) granulation within the wound bed. The  periwound skin  appearance exhibited: Localized Edema, Moist. The periwound skin appearance did not exhibit: Callus, Crepitus, Excoriation, Fluctuance, Friable, Induration, Rash, Scarring, Dry/Scaly, Maceration, Atrophie Blanche, Cyanosis, Ecchymosis, Hemosiderin Staining, Mottled, Pallor, Rubor, Erythema. Periwound temperature was noted as No Abnormality. Assessment Active Problems ICD-10 E11.622 - Type 2 diabetes mellitus with other skin ulcer I89.0 - Lymphedema, not elsewhere classified L97.312 - Non-pressure chronic ulcer of right ankle with fat layer exposed This 74 year old patient who seems to have bilateral lymphedema stage I has never been worked up for this and I have recommended venous duplex studies to be done. She does me that she had seen a previous for an arterial study of the left lower extremity and we will try and get these reports. I have recommended Aquacel Ag with a 2 layer compression wrap and have discussed the management of lymphedema with elevation and exercise. I have discussed her diagnosis and management at length and she has had all questions answered and will come back to see me regularly. Plan Wound Cleansing: Wound #1 Right,Proximal,Lateral Malleolus: Clean wound with Normal Saline. [name], [name]. ([id number]) Wound #2 Right,Distal,Lateral Malleolus: Clean wound with Normal Saline. Skin Barriers/Peri-Wound Care: Wound #1 Right,Proximal,Lateral Malleolus: Moisturizing lotion Wound #2 Right,Distal,Lateral Malleolus: Moisturizing lotion Primary Wound Dressing: Wound #1 Right,Proximal,Lateral Malleolus: Aquacel Ag Wound #2 Right,Distal,Lateral Malleolus: Aquacel Ag Secondary Dressing: Wound #1 Right,Proximal,Lateral Malleolus: ABD pad Wound #2 Right,Distal,Lateral Malleolus: ABD pad Dressing Change Frequency: Wound #1 Right,Proximal,Lateral Malleolus: Change dressing every week - Patient will come on Monday because husband is having surgery  on tuesday Wound #2 Right,Distal,Lateral Malleolus: Change dressing every week - Patient will come on Monday because husband is having surgery on tuesday Follow-up Appointments: Wound #1 Right,Proximal,Lateral Malleolus: Return Appointment in 1 week. Wound #2 Right,Distal,Lateral Malleolus: Return Appointment in 1 week. Edema Control: Wound #1 Right,Proximal,Lateral Malleolus: 2 Layer Compression System - Right Lower Extremity Wound #2 Right,Distal,Lateral Malleolus: 2 Layer Compression System - Right Lower Extremity Services and Therapies ordered were: Venous Studies -Bilateral This 74 year old patient who seems to have bilateral lymphedema stage I has never been worked up for this and I have recommended venous duplex studies to be done. She does me that she had seen a previous for an arterial study of the left lower extremity and we will try and get these reports. I have recommended Aquacel Ag with a 2 layer compression wrap and have discussed the management of lymphedema with elevation and exercise. Benning, Ingrid L. ([id number]) I have discussed her diagnosis and management at length and she has had all questions answered and will come back to see me regularly. Electronic Signature(s) Signed: 10/13/2015 4:47:26 PM By: Christin Fudge MD, FACS Previous Signature: 10/11/2015 2:54:29 PM Version By: Christin Fudge MD, FACS Entered By: Christin Fudge on 10/13/2015 16:47:26 Caccavale, [name] ([id number]) -------------------------------------------------------------------------------- ROS/PFSH Details Patient Name: ALESHA, KROGMANN 10/11/2015 1:30 Date of Service: PM Medical Record [id number] Number: Patient Account Number: [id number] 1941-10-03 (74 y.o. Treating RN: Baruch Gouty, RN, BSN, Velva Harman Date of Birth/Sex: Female) Other Clinician: Primary Care Physician: [name] Treating Tyger Wichman Referring Physician: Delight Stare Physician/Extender: Weeks in Treatment:  0 Information Obtained From Patient Wound History Do you currently have one or more open woundso No Have you tested positive for osteomyelitis (bone infection)o No Have you had any tests for circulation on your legso Yes Who ordered the testo Dr. Lucky Cowboy Where was the test doneo [facility] Have you had other problems associated with your woundso Swelling Constitutional Symptoms (General Health) Complaints and Symptoms: No Complaints or  Symptoms Eyes Complaints and Symptoms: No Complaints or Symptoms Medical History: Positive for: Cataracts - removed 2 years ago Negative for: Glaucoma; Optic Neuritis Ear/Nose/Mouth/Throat Complaints and Symptoms: No Complaints or Symptoms Medical History: Negative for: Chronic sinus problems/congestion; Middle ear problems Hematologic/Lymphatic Complaints and Symptoms: No Complaints or Symptoms Medical History: Negative for: Anemia; Hemophilia; Human Immunodeficiency Virus; Lymphedema; Sickle Cell Disease Edsall, Ashea L. ([id number]) Respiratory Complaints and Symptoms: No Complaints or Symptoms Medical History: Negative for: Aspiration; Asthma; Chronic Obstructive Pulmonary Disease (COPD); Pneumothorax; Sleep Apnea; Tuberculosis Cardiovascular Complaints and Symptoms: No Complaints or Symptoms Medical History: Positive for: Hypertension Gastrointestinal Complaints and Symptoms: No Complaints or Symptoms Medical History: Negative for: Cirrhosis ; Colitis; Crohnos; Hepatitis A; Hepatitis B; Hepatitis C Endocrine Complaints and Symptoms: No Complaints or Symptoms Medical History: Positive for: Type II Diabetes Time with diabetes: 8years Treated with: Oral agents Blood sugar tested every day: No Blood sugar testing results: Breakfast: 116 Genitourinary Complaints and Symptoms: No Complaints or Symptoms Medical History: Past Medical History Notes: Hx of Kidney stones, hysterectomy Immunological Complaints and Symptoms: No  Complaints or Symptoms Balbach, Neera L. ([id number]) Integumentary (Skin) Complaints and Symptoms: No Complaints or Symptoms Musculoskeletal Complaints and Symptoms: No Complaints or Symptoms Medical History: Positive for: Osteoarthritis Past Medical History Notes: Rotator cuff disorder Neurologic Complaints and Symptoms: No Complaints or Symptoms Medical History: Negative for: Dementia; Neuropathy; Quadriplegia; Paraplegia; Seizure Disorder Oncologic Complaints and Symptoms: No Complaints or Symptoms Medical History: Past Medical History Notes: Breast Ca Psychiatric Complaints and Symptoms: No Complaints or Symptoms Medical History: Negative for: Anorexia/bulimia; Confinement Anxiety HBO Extended History Items Eyes: Cataracts Family and Social History Cancer: No; Diabetes: No; Heart Disease: Yes - Mother, Father; Hereditary Spherocytosis: No; Hypertension: Yes - Mother, Father; Kidney Disease: No; Lung Disease: No; Seizures: No; Stroke: Yes - Mother, Father; Thyroid Problems: No; Tuberculosis: No; Never smoker; Alcohol Use: Never; Drug Use: No History; Caffeine Use: Daily; Financial Concerns: No; Food, Clothing or Shelter Needs: No; Support System Lacking: No; Transportation Concerns: No; Advanced Directives: No; Patient does not want information on Advanced Directives; Living Will: No LURETHA, PETERKIN ([id number]) Physician Affirmation I have reviewed and agree with the above information. Electronic Signature(s) Signed: 10/11/2015 1:39:46 PM By: Regan Lemming BSN, RN Signed: 10/11/2015 5:47:05 PM By: Christin Fudge MD, FACS Previous Signature: 10/11/2015 1:28:36 PM Version By: Christin Fudge MD, FACS Previous Signature: 10/11/2015 1:26:09 PM Version By: Regan Lemming BSN, RN Previous Signature: 10/11/2015 1:25:04 PM Version By: Regan Lemming BSN, RN Previous Signature: 10/11/2015 1:23:12 PM Version By: Regan Lemming BSN, RN Entered By: Regan Lemming on 10/11/2015  13:39:45 Favor, [name] ([id number]) -------------------------------------------------------------------------------- SuperBill Details Patient Name: MIETTE, BOLTON L. Date of Service: 10/11/2015 Medical Record Patient Account Number: [id number] Number: Afful, RN, BSN, Treating RN: Oct 23, 1941 (74 y.o. Velva Harman Date of Birth/Sex: Female) Other Clinician: Primary Care Physician: [name] Treating Jenise Iannelli Referring Physician: Delight Stare Physician/Extender: Suella Grove in Treatment: 0 Diagnosis Coding ICD-10 Codes Code Description E11.622 Type 2 diabetes mellitus with other skin ulcer I89.0 Lymphedema, not elsewhere classified L97.312 Non-pressure chronic ulcer of right ankle with fat layer exposed Facility Procedures CPT4 Code: [id number] Description: 99214 - WOUND CARE VISIT-LEV 4 EST PT Modifier: Quantity: 1 Physician Procedures CPT4 Code Description: [id number] - WC PHYS LEVEL 4 - NEW PT ICD-10 Description Diagnosis E11.622 Type 2 diabetes mellitus with other skin ulcer I89.0 Lymphedema, not elsewhere classified L97.312 Non-pressure chronic ulcer of right ankle with fat Modifier: layer exposed Quantity: 1 Electronic Signature(s) Signed: 10/11/2015 2:54:45  PM By: Christin Fudge MD, FACS Entered By: Christin Fudge on 10/11/2015 14:54:44

## 2015-10-13 ENCOUNTER — Inpatient Hospital Stay (HOSPITAL_BASED_OUTPATIENT_CLINIC_OR_DEPARTMENT_OTHER): Payer: Medicare HMO | Admitting: Oncology

## 2015-10-13 ENCOUNTER — Encounter: Payer: Self-pay | Admitting: Oncology

## 2015-10-13 ENCOUNTER — Inpatient Hospital Stay: Payer: Medicare HMO | Attending: Oncology

## 2015-10-13 VITALS — BP 109/70 | HR 76 | Temp 97.5°F | Wt 146.2 lb

## 2015-10-13 DIAGNOSIS — Z9011 Acquired absence of right breast and nipple: Secondary | ICD-10-CM | POA: Diagnosis not present

## 2015-10-13 DIAGNOSIS — Z9223 Personal history of estrogen therapy: Secondary | ICD-10-CM | POA: Insufficient documentation

## 2015-10-13 DIAGNOSIS — C50911 Malignant neoplasm of unspecified site of right female breast: Secondary | ICD-10-CM | POA: Insufficient documentation

## 2015-10-13 DIAGNOSIS — M810 Age-related osteoporosis without current pathological fracture: Secondary | ICD-10-CM

## 2015-10-13 DIAGNOSIS — Z79810 Long term (current) use of selective estrogen receptor modulators (SERMs): Secondary | ICD-10-CM | POA: Diagnosis not present

## 2015-10-13 DIAGNOSIS — I10 Essential (primary) hypertension: Secondary | ICD-10-CM | POA: Diagnosis not present

## 2015-10-13 DIAGNOSIS — E119 Type 2 diabetes mellitus without complications: Secondary | ICD-10-CM

## 2015-10-13 DIAGNOSIS — Z853 Personal history of malignant neoplasm of breast: Secondary | ICD-10-CM

## 2015-10-13 DIAGNOSIS — Z170 Estrogen receptor positive status [ER+]: Secondary | ICD-10-CM | POA: Insufficient documentation

## 2015-10-13 DIAGNOSIS — Z7984 Long term (current) use of oral hypoglycemic drugs: Secondary | ICD-10-CM

## 2015-10-13 DIAGNOSIS — Z79899 Other long term (current) drug therapy: Secondary | ICD-10-CM | POA: Diagnosis not present

## 2015-10-13 DIAGNOSIS — Z87442 Personal history of urinary calculi: Secondary | ICD-10-CM | POA: Insufficient documentation

## 2015-10-13 DIAGNOSIS — Z923 Personal history of irradiation: Secondary | ICD-10-CM | POA: Insufficient documentation

## 2015-10-13 DIAGNOSIS — C50919 Malignant neoplasm of unspecified site of unspecified female breast: Secondary | ICD-10-CM

## 2015-10-13 DIAGNOSIS — M1990 Unspecified osteoarthritis, unspecified site: Secondary | ICD-10-CM | POA: Insufficient documentation

## 2015-10-13 LAB — CBC WITH DIFFERENTIAL/PLATELET
BASOS PCT: 1 %
Basophils Absolute: 0.1 10*3/uL (ref 0–0.1)
Eosinophils Absolute: 0.1 10*3/uL (ref 0–0.7)
Eosinophils Relative: 1 %
HEMATOCRIT: 36.4 % (ref 35.0–47.0)
HEMOGLOBIN: 11.9 g/dL — AB (ref 12.0–16.0)
LYMPHS ABS: 3.3 10*3/uL (ref 1.0–3.6)
Lymphocytes Relative: 31 %
MCH: 28.3 pg (ref 26.0–34.0)
MCHC: 32.7 g/dL (ref 32.0–36.0)
MCV: 86.4 fL (ref 80.0–100.0)
MONOS PCT: 6 %
Monocytes Absolute: 0.6 10*3/uL (ref 0.2–0.9)
NEUTROS ABS: 6.4 10*3/uL (ref 1.4–6.5)
NEUTROS PCT: 61 %
Platelets: 265 10*3/uL (ref 150–440)
RBC: 4.22 MIL/uL (ref 3.80–5.20)
RDW: 15.6 % — ABNORMAL HIGH (ref 11.5–14.5)
WBC: 10.5 10*3/uL (ref 3.6–11.0)

## 2015-10-13 LAB — COMPREHENSIVE METABOLIC PANEL
ALBUMIN: 3.9 g/dL (ref 3.5–5.0)
ALK PHOS: 35 U/L — AB (ref 38–126)
ALT: 20 U/L (ref 14–54)
ANION GAP: 9 (ref 5–15)
AST: 26 U/L (ref 15–41)
BILIRUBIN TOTAL: 0.4 mg/dL (ref 0.3–1.2)
BUN: 30 mg/dL — AB (ref 6–20)
CALCIUM: 9.2 mg/dL (ref 8.9–10.3)
CO2: 28 mmol/L (ref 22–32)
CREATININE: 1.18 mg/dL — AB (ref 0.44–1.00)
Chloride: 99 mmol/L — ABNORMAL LOW (ref 101–111)
GFR calc Af Amer: 52 mL/min — ABNORMAL LOW (ref >60)
GFR calc non Af Amer: 45 mL/min — ABNORMAL LOW (ref >60)
GLUCOSE: 118 mg/dL — AB (ref 65–99)
Potassium: 3.5 mmol/L (ref 3.5–5.1)
Sodium: 136 mmol/L (ref 135–145)
TOTAL PROTEIN: 7.3 g/dL (ref 6.5–8.1)

## 2015-10-13 NOTE — Progress Notes (Signed)
[place] @ [facility] [phone]  Fax:([phone]) [place]: 13-Sep-1941  MR#: [id number]  JKD#:[id number]  Patient Care Team: Marguerita Merles, MD as PCP - General (Family Medicine)  CHIEF COMPLAINT:   Oncology History   1.abnormal mammogram dated January, 2014.  Her right breast approximately 1.2 x 0.7 cm palpable abnormality confirmed on ultrasound  Biopsy was consistent with invasive mammary carcinoma.  Estrogen receptor, progesterone receptor, HER-2/neu receptor pending.Marland Kitchen 2.in 1992 patient had carcinoma of right breast (Lesion  at  3 o'clock) had lumpectomy axillary node dissection  axillary  nodes are reported to be negative.  Patient had radiation therapy followed by 5 years of tamoxifen. 3.her right breast mastectomy(27th of March, 2014) pT1c Pnx MO     Estrogen receptor IHC: Positive, 90% strong staining.         Progesterone receptor: Negative, 0% staining.         HER2-neu FISH: NOT amplified, HER2/CEP17 ratio 1.11.  Oncotype   DX score was suggestive of 11% recurrent disease over period of   next 10 years with anti-hormone therapy 3.Patient was started on letrozole but because of osteoporosis and bony pain was switched to tamoxifen        No flowsheet data found.  INTERVAL HISTORY:  74 year old lady came today for further follow-up regarding carcinoma of right breast.  On tamoxifen.  Tolerating treatment very well.  No hot flashes.  No bony pains Appetite remains stable Patient is here for further evaluation.  Taking tamoxifen tolerating very well. Continues to have difficulty in ambulation.  No vaginal bleeding.  No swelling of lower extremity. Recently had a problem with a right ankle and was referred to wound care clinic Right ankle is healing well   REVIEW OF SYSTEMS:       GENERAL:  Feels good.  Active.  No fevers, sweats or weight loss. PERFORMANCE STATUS (ECOG): 01 HEENT:  No visual changes, runny nose, sore throat, mouth  sores or tenderness. Lungs: No shortness of breath or cough.  No hemoptysis. Cardiac:  No chest pain, palpitations, orthopnea, or PND. GI:  No nausea, vomiting, diarrhea, constipation, melena or hematochezia. GU:  No urgency, frequency, dysuria, or hematuria. Musculoskeletal:  Continues to a problem related to the joint pains Extremities:  No pain or swelling. Skin:  No rashes or skin changes. Neuro:  No headache, numbness or weakness, balance or coordination issues. Endocrine:  No diabetes, thyroid issues, hot flashes or night sweats. Psych:  No mood changes, depression or anxiety. Pain:  No focal pain. Review of systems:  All other systems reviewed and found to be negative. As per HPI. Otherwise, a complete review of systems is negatve.  PAST MEDICAL HISTORY: Past Medical History  Diagnosis Date  . Cancer of right breast 04/17/2015     HTN:    arthritis:    Diabetes Mellitus, Type II (NIDD):    hx of kidney stones:    breast cancer:    vaginal hysterectomy:    gallbladder removed: 1993   right shoulder surgery: 2004   right ankle surgery: 2004   Tumor removed on right breast and radiation given:   Preventive Screening:  Has patient had any of the following test? Colonscopy  Mammography (1)   Last Colonoscopy: 2009(1)   Last Mammography: Jan 2015(1)   Smoking History: Smoking History Never Smoked.(1)  PFSH: Family History: noncontributory  Social History: negative alcohol, negative tobacco  Additional Past Medical and Surgical History: As mentioned  above   ADVANCED DIRECTIVES:  Patient does not have any advanced healthcare directive. Information has been given. HEALTH MAINTENANCE: Social History  Substance Use Topics  . Smoking status: Never Smoker   . Smokeless tobacco: Not on file  . Alcohol Use: Not on file      Allergies  Allergen Reactions  . Other Anaphylaxis    Anesthisia has been a health issue for her in the past.   . Sulfa Antibiotics  Rash    Current Outpatient Prescriptions  Medication Sig Dispense Refill  . alendronate (FOSAMAX) 70 MG tablet Take by mouth.    Marland Kitchen atenolol (TENORMIN) 50 MG tablet Take by mouth.    Marland Kitchen gemfibrozil (LOPID) 600 MG tablet Take by mouth.    Marland Kitchen HYDROcodone-acetaminophen (NORCO/VICODIN) 5-325 MG per tablet Take by mouth.    . levothyroxine (SYNTHROID, LEVOTHROID) 50 MCG tablet Take by mouth.    Marland Kitchen lisinopril-hydrochlorothiazide (PRINZIDE,ZESTORETIC) 20-12.5 MG per tablet     . metFORMIN (GLUCOPHAGE) 500 MG tablet Take by mouth.    . tamoxifen (NOLVADEX) 20 MG tablet     . Vitamin D, Ergocalciferol, (DRISDOL) 50000 UNITS CAPS capsule Take by mouth.     No current facility-administered medications for this visit.    OBJECTIVE:  There were no vitals filed for this visit.   There is no height or weight on file to calculate BMI.    ECOG FS:1 - Symptomatic but completely ambulatory  PHYSICAL EXAM: General  status: Performance status is good.  Patient has not lost significant weight HEENT: No evidence of stomatitis. Sclera and conjunctivae :: No jaundice.   pale looking. Lungs: Air  entry equal on both sides.  No rhonchi.  No rales.  Cardiac: Heart sounds are normal.  No pericardial rub.  No murmur. Lymphatic system: Cervical, axillary, inguinal, lymph nodes not palpable GI: Abdomen is soft.  No ascites.  Liver spleen not palpable.  No tenderness.  Bowel sounds are within normal limit Lower extremity: No edema Neurological system: Higher functions, cranial nerves intact no evidence of peripheral neuropathy. Skin: No rash.  No ecchymosis.. Examination of the right chest wall there is no evidence of recurrent disease.  Left breast free of masses.  Left breast: Free of masses LAB RESULTS:  Appointment on 10/13/2015  Component Date Value Ref Range Status  . WBC 10/13/2015 10.5  3.6 - 11.0 K/uL Final  . RBC 10/13/2015 4.22  3.80 - 5.20 MIL/uL Final  . Hemoglobin 10/13/2015 11.9* 12.0 - 16.0 g/dL  Final  . HCT 10/13/2015 36.4  35.0 - 47.0 % Final  . MCV 10/13/2015 86.4  80.0 - 100.0 fL Final  . MCH 10/13/2015 28.3  26.0 - 34.0 pg Final  . MCHC 10/13/2015 32.7  32.0 - 36.0 g/dL Final  . RDW 10/13/2015 15.6* 11.5 - 14.5 % Final  . Platelets 10/13/2015 265  150 - 440 K/uL Final  . Neutrophils Relative % 10/13/2015 61   Final  . Neutro Abs 10/13/2015 6.4  1.4 - 6.5 K/uL Final  . Lymphocytes Relative 10/13/2015 31   Final  . Lymphs Abs 10/13/2015 3.3  1.0 - 3.6 K/uL Final  . Monocytes Relative 10/13/2015 6   Final  . Monocytes Absolute 10/13/2015 0.6  0.2 - 0.9 K/uL Final  . Eosinophils Relative 10/13/2015 1   Final  . Eosinophils Absolute 10/13/2015 0.1  0 - 0.7 K/uL Final  . Basophils Relative 10/13/2015 1   Final  . Basophils Absolute 10/13/2015 0.1  0 - 0.1 K/uL  Final     STUDIES: Dg Ankle Complete Right  09/20/2015  CLINICAL DATA:  Cellulitis RIGHT lower leg beginning 1 week ago, question osteomyelitis; history of surgery in 2014 EXAM: RIGHT ANKLE - COMPLETE 3+ VIEW COMPARISON:  None FINDINGS: Screw fragment distal tibial metaphysis. Deformity of distal tibia and fibula post healed fractures. Secondary degenerative changes of ankle joint with joint space narrowing and spur formation as well as subchondral sclerosis at the tibia and talar dome deformity. Regional soft tissue swelling. Additional subtalar degenerative changes. New bulky spur formation at Achilles tendon insertion with focal soft tissue swelling dorsally. No acute fracture, dislocation, or bone destruction. IMPRESSION: Posttraumatic and postsurgical changes of the RIGHT ankle with secondary degenerative changes of the tibiotalar joint and to a lesser degree subtalar joints. No definite acute bony abnormalities. Electronically Signed   By: Lavonia Dana M.D.   On: 09/20/2015 07:51    ASSESSMENT: Carcinoma of right breast.  All Data has been reviewed.  No evidence of recurrent or progressive disease  MEDICAL DECISION  MAKING:  Continue tamoxifen Patient expressed understanding and was in agreement with this plan. She also understands that She can call clinic at any time with any questions, concerns, or complaints.  All lab data has been reviewed Mammogram is been scheduled in January of 2017 On clinical examination there is no evidence of recurrent disease Continue tamoxifen Right ankle injury has been gradually healing   Cancer of right breast   Staging form: Breast, AJCC 7th Edition     Clinical: Stage IA (T1c, N0, M0) - Marni Griffon, MD   10/13/2015 3:18 PM

## 2015-10-15 ENCOUNTER — Encounter: Payer: Self-pay | Admitting: Oncology

## 2015-10-17 ENCOUNTER — Encounter: Payer: Medicare HMO | Attending: Surgery | Admitting: Surgery

## 2015-10-17 DIAGNOSIS — Z853 Personal history of malignant neoplasm of breast: Secondary | ICD-10-CM | POA: Diagnosis not present

## 2015-10-17 DIAGNOSIS — I10 Essential (primary) hypertension: Secondary | ICD-10-CM | POA: Diagnosis not present

## 2015-10-17 DIAGNOSIS — L97312 Non-pressure chronic ulcer of right ankle with fat layer exposed: Secondary | ICD-10-CM | POA: Insufficient documentation

## 2015-10-17 DIAGNOSIS — E11622 Type 2 diabetes mellitus with other skin ulcer: Secondary | ICD-10-CM | POA: Diagnosis not present

## 2015-10-17 DIAGNOSIS — I890 Lymphedema, not elsewhere classified: Secondary | ICD-10-CM | POA: Insufficient documentation

## 2015-10-17 NOTE — Progress Notes (Signed)
[name], [name] ([id number]) Visit Report for 10/17/2015 Arrival Information Details Patient Name: [name], [name]. Date of Service: 10/17/2015 3:45 PM Medical Record Number: [id number] Patient Account Number: [id number] Date of Birth/Sex: 08-May-1941 (74 y.o. Female) Treating [name]: [name] Primary Care Physician: [name] Other Clinician: Referring Physician: Delight [name] Treating Physician/Extender: [name] in Treatment: 0 Visit Information History Since Last Visit All ordered tests and consults were completed: No Patient Arrived: [name] Added or deleted any medications: No Arrival Time: 15:46 Any new allergies or adverse reactions: No Accompanied By: husband Had a fall or experienced change in No Transfer Assistance: None activities of daily living that may affect Patient Identification Verified: Yes risk of falls: Secondary Verification Process Yes Signs or symptoms of abuse/neglect since last No Completed: visito Patient Requires Transmission- No Hospitalized since last visit: No Based Precautions: Has Dressing in Place as Prescribed: Yes Patient Has Alerts: Yes Has Compression in Place as Prescribed: Yes Patient Alerts: Patient on Blood Pain Present Now: No Thinner Aspirin Electronic Signature(s) Signed: 10/17/2015 4:20:52 PM By: [name], [name] Entered By: [name], [name] on 10/17/2015 15:46:55 [name], [name] ([id number]) -------------------------------------------------------------------------------- Encounter Discharge Information Details Patient Name: [name] Ramus L. Date of Service: 10/17/2015 3:45 PM Medical Record Number: [id number] Patient Account Number: [id number] Date of Birth/Sex: 1941-03-26 (74 y.o. Female) Treating [name]: [name] Primary Care Physician: [name] Other Clinician: Referring Physician: Delight [name] Treating Physician/Extender: [name] in Treatment: 0 Encounter  Discharge Information Items Discharge Pain Level: 0 Discharge Condition: Stable Ambulatory Status: Cane Discharge Destination: Home Transportation: Private Auto Accompanied By: husband Schedule Follow-up Appointment: Yes Medication Reconciliation completed Yes and provided to Patient/Care [name]: Provided on Clinical Summary of Care: 10/17/2015 Form Type Recipient Paper Patient DM Electronic Signature(s) Signed: 10/17/2015 4:20:52 PM By: [name], [name] Previous Signature: 10/17/2015 4:12:21 PM Version By: [name] Entered By: [name], [name] on 10/17/2015 16:14:24 [name], [name] ([id number]) -------------------------------------------------------------------------------- Lower Extremity Assessment Details Patient Name: [name] Ramus L. Date of Service: 10/17/2015 3:45 PM Medical Record Number: [id number] Patient Account Number: [id number] Date of Birth/Sex: Jul 16, 1941 (74 y.o. Female) Treating [name]: [name] Primary Care Physician: [name] Other Clinician: Referring Physician: Delight [name] Treating Physician/Extender: [name] in Treatment: 0 Edema Assessment Assessed: [Left: No] [Right: No] E[Left: dema] [Right: :] Calf Left: Right: Point of Measurement: 30 cm From Medial Instep cm 34.5 cm Ankle Left: Right: Point of Measurement: 10 cm From Medial Instep cm 23.4 cm Vascular Assessment Pulses: Posterior Tibial Dorsalis Pedis Palpable: [Right:Yes] Extremity colors, hair growth, and conditions: Extremity Color: [Right:Mottled] Hair Growth on Extremity: [Right:Yes] Temperature of Extremity: [Right:Warm] Capillary Refill: [Right:< 3 seconds] Dependent Rubor: [Right:No] Blanched when Elevated: [Right:No] Lipodermatosclerosis: [Right:No] Toe Nail Assessment Left: Right: Thick: No Discolored: No Deformed: No Improper Length and Hygiene: No Electronic Signature(s) Signed: 10/17/2015 4:20:52 PM By: [name],  [name], [name] ([id number]) Entered By: [name], [name] on 10/17/2015 15:54:27 [name], [name] ([id number]) -------------------------------------------------------------------------------- Multi Wound Chart Details Patient Name: [name] Ramus L. Date of Service: 10/17/2015 3:45 PM Medical Record Number: [id number] Patient Account Number: [id number] Date of Birth/Sex: 15-Mar-1941 (74 y.o. Female) Treating [name]: [name] Primary Care Physician: [name] Other Clinician: Referring Physician: Delight [name] Treating Physician/Extender: [name] in Treatment: 0 Vital Signs Height(in): 63 Pulse(bpm): 73 Weight(lbs): 146 Blood Pressure 108/61 (mmHg): Body Mass Index(BMI): 26 Temperature(F): 98.2 Respiratory Rate 16 (breaths/min): Wound Assessments Treatment Notes Electronic Signature(s) Signed: 10/17/2015 4:20:52 PM By: [name], [name]  By: [name] Eaton, [name], [name] on 10/17/2015 15:57:15 [name], [name] ([id number]) -------------------------------------------------------------------------------- [place] Details Patient Name: [name], [name]. Date of Service: 10/17/2015 3:45 PM Medical Record Number: [id number] Patient Account Number: [id number] Date of Birth/Sex: [birth year] (74 y.o. Female) Treating [name]: [name] Primary Care Physician: [name] Other Clinician: Referring Physician: Delight [name] Treating Physician/Extender: [name] in Treatment: 0 Active Inactive Orientation to the Wound Care Program Nursing Diagnoses: Knowledge deficit related to the wound healing center program Goals: Patient/caregiver will verbalize understanding of the [facility] Date Initiated: 10/11/2015 Goal Status: Active Interventions: Provide education on orientation to the wound center Notes: Wound/Skin Impairment Nursing Diagnoses: Impaired tissue integrity Knowledge  deficit related to ulceration/compromised skin integrity Goals: Patient/caregiver will verbalize understanding of skin care regimen Date Initiated: 10/11/2015 Goal Status: Active Ulcer/skin breakdown will have a volume reduction of 30% by week 4 Date Initiated: 10/11/2015 Goal Status: Active Ulcer/skin breakdown will have a volume reduction of 50% by week 8 Date Initiated: 10/11/2015 Goal Status: Active Ulcer/skin breakdown will have a volume reduction of 80% by week 12 Date Initiated: 10/11/2015 Goal Status: Active Ulcer/skin breakdown will heal within 14 weeks Date Initiated: 10/11/2015 [name], [name] ([id number]) Goal Status: Active Interventions: Assess patient/caregiver ability to obtain necessary supplies Assess patient/caregiver ability to perform ulcer/skin care regimen upon admission and as needed Assess ulceration(s) every visit Provide education on ulcer and skin care Treatment Activities: Referred to DME Nimisha Rathel for dressing supplies : 10/17/2015 Skin care regimen initiated : 10/17/2015 Topical wound management initiated : 10/17/2015 Notes: Electronic Signature(s) Signed: 10/17/2015 4:20:52 PM By: [name], [name] Entered By: [name], [name] on 10/17/2015 15:57:10 [name], [name] ([id number]) -------------------------------------------------------------------------------- Pain Assessment Details Patient Name: [name] Ramus L. Date of Service: 10/17/2015 3:45 PM Medical Record Number: [id number] Patient Account Number: [id number] Date of Birth/Sex: [birth year] (74 y.o. Female) Treating [name]: [name] Primary Care Physician: [name] Other Clinician: Referring Physician: Delight [name] Treating Physician/Extender: [name] in Treatment: 0 Active Problems Location of Pain Severity and Description of Pain Patient Has Paino No Site Locations Rate the pain. Current Pain Level: 0 Pain Management and Medication Current Pain  Management: Electronic Signature(s) Signed: 10/17/2015 4:20:52 PM By: [name] Eaton, [name], [name] Entered By: [name], [name] on 10/17/2015 15:47:09 [name], [name] ([id number]) -------------------------------------------------------------------------------- Patient/Caregiver Education Details Patient Name: [name] Date of Service: 10/17/2015 3:45 PM Medical Record Number: [id number] Patient Account Number: [id number] Date of Birth/Gender: [birth year] (74 y.o. Female) Treating [name]: [name] Primary Care Physician: [name] Other Clinician: Referring Physician: Delight [name] Treating Physician/Extender: [name] in Treatment: 0 Education Assessment Education Provided To: Patient and Caregiver Education Topics Provided Wound/Skin Impairment: Handouts: Caring for Your Ulcer, Skin Care Do's and Dont's Methods: Explain/Verbal Responses: State content correctly Electronic Signature(s) Signed: 10/17/2015 4:20:52 PM By: [name], [name] Entered By: [name], [name] on 10/17/2015 15:57:31 [name], [name] ([id number]) -------------------------------------------------------------------------------- Wound Assessment Details Patient Name: [name] Ramus L. Date of Service: 10/17/2015 3:45 PM Medical Record Number: [id number] Patient Account Number: [id number] Date of Birth/Sex: Dec 24, 1940 (74 y.o. Female) Treating [name]: [name] Primary Care Physician: [name] Other Clinician: Referring Physician: Delight [name] Treating Physician/Extender: [name] in Treatment: 0 Wound Status Wound Number: 1 Primary Cellulitis Etiology: Wound Location: Right Malleolus - Lateral, Proximal Wound Open Status: Wounding Event: Gradually Appeared Comorbid Cataracts, Hypertension, Type II Date Acquired: 09/11/2015 History: Diabetes, Osteoarthritis Weeks Of Treatment: 0 Clustered Wound: No Wound Measurements Length: (cm) 0.6 Width: (cm)  0.3 Depth: (cm) 0.1 Area: (cm) 0.141 Volume: (cm) 0.014 % Reduction in Area: 55.1% % Reduction in Volume: 54.8% Epithelialization: None Tunneling: No Undermining: No Wound Description Classification: Partial Thickness Diabetic Severity (Wagner): Grade 1 Wound Margin: Distinct, outline attache Exudate Amount: Small Exudate Type: Serous Exudate Color: amber Foul Odor After Cleansing: No d Wound Bed Granulation Amount: Medium (34-66%) Exposed Structure Granulation Quality: Pink, Pale Fascia Exposed: No Necrotic Amount: Small (1-33%) Fat Layer Exposed: No Necrotic Quality: Adherent Slough Tendon Exposed: No Muscle Exposed: No Joint Exposed: No Bone Exposed: No Limited to Skin Breakdown Periwound Skin Texture Texture Color No Abnormalities Noted: No No Abnormalities Noted: No Callus: No Atrophie Blanche: No Crepitus: No Cyanosis: No Binz, Capricia L. ([id number]) Excoriation: No Ecchymosis: No Fluctuance: No Erythema: No Friable: No Hemosiderin Staining: No Induration: No Mottled: No Localized Edema: Yes Pallor: No Rash: No Rubor: No Scarring: No Temperature / Pain Moisture Temperature: No Abnormality No Abnormalities Noted: No Dry / Scaly: No Maceration: No Moist: Yes Wound Preparation Ulcer Cleansing: Rinsed/Irrigated with Saline Topical Anesthetic Applied: Other: lidocaine 4%, Treatment Notes Wound #1 (Right, Proximal, Lateral Malleolus) 1. Cleansed with: Clean wound with Normal Saline 2. Anesthetic Topical Lidocaine 4% cream to wound bed prior to debridement 4. Dressing Applied: Aquacel Ag 5. Secondary Dressing Applied Dry Gauze 7. Secured with 3 Layer Compression System - Right Lower Extremity Electronic Signature(s) Signed: 10/17/2015 4:20:52 PM By: [name] Eaton, [name], [name] Entered By: [name], [name] on 10/17/2015 16:00:53 [name], [name]  ([id number]) -------------------------------------------------------------------------------- Wound Assessment Details Patient Name: [name] Ramus L. Date of Service: 10/17/2015 3:45 PM Medical Record Number: [id number] Patient Account Number: [id number] Date of Birth/Sex: [birth year] (74 y.o. Female) Treating [name]: [name] Primary Care Physician: [name] Other Clinician: Referring Physician: Delight [name] Treating Physician/Extender: [name] in Treatment: 0 Wound Status Wound Number: 2 Primary Cellulitis Etiology: Wound Location: Right Malleolus - Lateral, Distal Wound Open Wounding Event: Gradually Appeared Status: Date Acquired: 09/11/2015 Comorbid Cataracts, Hypertension, Type II Weeks Of Treatment: 0 History: Diabetes, Osteoarthritis Clustered Wound: No Wound Measurements Length: (cm) 0 % Redu Width: (cm) 0 % Redu Depth: (cm) 0 Epithe Area: (cm) 0 Tunne Volume: (cm) 0 Under ction in Area: 100% ction in Volume: 100% lialization: Large (67-100%) ling: No mining: No Wound Description Classification: Partial Thickness Diabetic Severity (Wagner): Grade 1 Wound Margin: Distinct, outline attache Exudate Amount: Small Exudate Type: Serous Exudate Color: amber Foul Odor After Cleansing: No d Wound Bed Granulation Amount: Large (67-100%) Exposed Structure Fascia Exposed: No Fat Layer Exposed: No Tendon Exposed: No Muscle Exposed: No Joint Exposed: No Bone Exposed: No Limited to Skin Breakdown Periwound Skin Texture Texture Color No Abnormalities Noted: No No Abnormalities Noted: No Callus: No Atrophie Blanche: No Crepitus: No Cyanosis: No Excoriation: No Ecchymosis: No Wetherby, Kristol L. ([id number]) Fluctuance: No Erythema: No Friable: No Hemosiderin Staining: No Induration: No Mottled: No Localized Edema: Yes Pallor: No Rash: No Rubor: No Scarring: No Temperature / Pain Moisture Temperature: No Abnormality No  Abnormalities Noted: No Dry / Scaly: No Maceration: No Moist: Yes Wound Preparation Ulcer Cleansing: Rinsed/Irrigated with Saline Topical Anesthetic Applied: None Electronic Signature(s) Signed: 10/17/2015 4:20:52 PM By: [name] Eaton, [name], [name] Entered By: [name], [name] on 10/17/2015 15:59:47 Gracie, [name] ([id number]) -------------------------------------------------------------------------------- Vitals Details Patient Name: [name] Ramus L. Date of Service: 10/17/2015 3:45 PM Medical Record Number: [id number] Patient Account Number: [id number] Date of Birth/Sex: June 02, 1941 (74 y.o. Female) Treating [name]: [name] Primary Care Physician: [name] Other Clinician: Referring Physician:  MILES, LINDA Treating Physician/Extender: [name] in Treatment: 0 Vital Signs Time Taken: 15:47 Temperature (F): 98.2 Height (in): 63 Pulse (bpm): 73 Weight (lbs): 146 Respiratory Rate (breaths/min): 16 Body Mass Index (BMI): 25.9 Blood Pressure (mmHg): 108/61 Reference Range: 80 - 120 mg / dl Pulse Oximetry (%): 100 Electronic Signature(s) Signed: 10/17/2015 4:20:52 PM By: [name], [name] Entered By: [name], [name] on 10/17/2015 15:52:31

## 2015-10-17 NOTE — Progress Notes (Addendum)
ABBIGAEL, RAMLER ([id number]) Visit Report for 10/17/2015 Chief Complaint Document Details Patient Name: [name], [name]. Date of Service: 10/17/2015 3:45 PM Medical Record Number: [id number] Patient Account Number: [id number] Date of Birth/Sex: [birth year] (74 y.o. Female) Treating RN: Macarthur Critchley Primary Care Physician: Delight Stare Other Clinician: Referring Physician: Delight Stare Treating Physician/Extender: Frann Rider in Treatment: 0 Information Obtained from: Patient Chief Complaint Patients presents for treatment of an open diabetic ulcer. the patient has had swelling of both legs for about 3 months and recently developed a redness with pain and ulceration on the right lower extremity for about 3 weeks. Electronic Signature(s) Signed: 10/17/2015 4:02:28 PM By: Christin Fudge MD, FACS Entered By: Christin Fudge on 10/17/2015 16:02:28 [place], [name] ([id number]) -------------------------------------------------------------------------------- HPI Details Patient Name: [name] Ramus L. Date of Service: 10/17/2015 3:45 PM Medical Record Number: [id number] Patient Account Number: [id number] Date of Birth/Sex: [birth year] (74 y.o. Female) Treating RN: Macarthur Critchley Primary Care Physician: Delight Stare Other Clinician: Referring Physician: Delight Stare Treating Physician/Extender: Frann Rider in Treatment: 0 History of Present Illness Location: swelling both lower extremities and ulceration on the right lateral ankle Quality: Patient reports experiencing a dull pain to affected area(s). Severity: Patient states wound (s) are getting better. Duration: Patient has had the wound for < 4 weeks prior to presenting for treatment Timing: Pain in wound is Intermittent (comes and goes Context: The wound appeared gradually over time Modifying Factors: Other treatment(s) tried include:she received amoxicillin and doxycycline and also has been putting some  local bandage. Associated Signs and Symptoms: Patient reports having increase swelling. HPI Description: 74 year old patient who comes with a referral for bilateral lower extremity edema and a lower extremity ulceration and has been sent by her PCP Dr. Placido Sou. I understand the patient was recently put on amoxicillin and doxycycline but could not tolerate the amoxicillin. doxycycline course was completed. a BNP and EKG was supposed to be normal and the patient did not have any dyspnea. the patient has been on a diuretic. The patient was also prescribed a pair of elastic compression stockings of the 20-30 mmHg pressure variety. x-ray of the right ankle was done on 09/20/2015 and showed posttraumatic and postsurgical changes of the right ankle with secondary degenerative changes of the tibiotalar joint and to a lesser degree the subtalar joint. No definite acute bony abnormalities are noted. Past medical history significant for diabetes mellitus, hypertension, hyperlipidemia, right breast cancer treated with a mastectomy in 2014. She has never smoked. Electronic Signature(s) Signed: 10/17/2015 4:02:40 PM By: Christin Fudge MD, FACS Entered By: Christin Fudge on 10/17/2015 16:02:40 [place], [name] ([id number]) -------------------------------------------------------------------------------- Physical Exam Details Patient Name: [name] Ramus L. Date of Service: 10/17/2015 3:45 PM Medical Record Number: [id number] Patient Account Number: [id number] Date of Birth/Sex: June 20, 1941 (74 y.o. Female) Treating RN: Macarthur Critchley Primary Care Physician: Delight Stare Other Clinician: Referring Physician: Delight Stare Treating Physician/Extender: Frann Rider in Treatment: 0 Constitutional . Pulse regular. Respirations normal and unlabored. Afebrile. . Eyes Nonicteric. Reactive to light. Ears, Nose, Mouth, and Throat Lips, teeth, and gums WNL.Marland Kitchen Moist mucosa without lesions  . Neck supple and nontender. No palpable supraclavicular or cervical adenopathy. Normal sized without goiter. Respiratory WNL. No retractions.. Cardiovascular Pedal Pulses WNL. No clubbing, cyanosis or edema. Lymphatic No adneopathy. No adenopathy. No adenopathy. Musculoskeletal Adexa without tenderness or enlargement.. Digits and nails w/o clubbing, cyanosis, infection, petechiae, ischemia, or inflammatory conditions.. Integumentary (Hair, Skin) No suspicious lesions. No crepitus or fluctuance. No peri-wound warmth or  erythema. No masses.Marland Kitchen Psychiatric Judgement and insight Intact.. No evidence of depression, anxiety, or agitation.. Notes one of the previous ulceration is healed and the other one is fairly shallow but present. Electronic Signature(s) Signed: 10/17/2015 4:03:28 PM By: Christin Fudge MD, FACS Entered By: Christin Fudge on 10/17/2015 16:03:28 [place], [name] ([id number]) -------------------------------------------------------------------------------- Physician Orders Details Patient Name: [name] Nims. Date of Service: 10/17/2015 3:45 PM Medical Record Number: [id number] Patient Account Number: [id number] Date of Birth/Sex: [birth year] (74 y.o. Female) Treating RN: Macarthur Critchley Primary Care Physician: Delight Stare Other Clinician: Referring Physician: Delight Stare Treating Physician/Extender: Frann Rider in Treatment: 0 Verbal / Phone Orders: Yes Clinician: Macarthur Critchley Read Back and Verified: No Diagnosis Coding Wound Cleansing Wound #1 Right,Proximal,Lateral Malleolus o Clean wound with Normal Saline. Skin Barriers/Peri-Wound Care Wound #1 Right,Proximal,Lateral Malleolus o Moisturizing lotion Primary Wound Dressing Wound #1 Right,Proximal,Lateral Malleolus o Aquacel Ag Secondary Dressing Wound #1 Right,Proximal,Lateral Malleolus o ABD pad Dressing Change Frequency Wound #1 Right,Proximal,Lateral Malleolus o Change  dressing every week - Patient will come on Monday because husband is having surgery on tuesday Follow-up Appointments Wound #1 Right,Proximal,Lateral Malleolus o Return Appointment in 1 week. Edema Control Wound #1 Right,Proximal,Lateral Malleolus o 3 Layer Compression System - Right Lower Extremity Electronic Signature(s) Signed: 10/17/2015 4:20:52 PM By: Ardean Larsen Signed: 10/17/2015 4:22:10 PM By: Christin Fudge MD, FACS Entered By: Rebecca Eaton RN, [name] on 10/17/2015 16:02:31 [place], [name] ([id number]) [place], [name] ([id number]) -------------------------------------------------------------------------------- Problem List Details Patient Name: [name], [name]. Date of Service: 10/17/2015 3:45 PM Medical Record Number: [id number] Patient Account Number: [id number] Date of Birth/Sex: April 19, 1941 (74 y.o. Female) Treating RN: Macarthur Critchley Primary Care Physician: Delight Stare Other Clinician: Referring Physician: Delight Stare Treating Physician/Extender: Frann Rider in Treatment: 0 Active Problems ICD-10 Encounter Code Description Active Date Diagnosis E11.622 Type 2 diabetes mellitus with other skin ulcer 10/11/2015 Yes I89.0 Lymphedema, not elsewhere classified 10/11/2015 Yes L97.312 Non-pressure chronic ulcer of right ankle with fat layer 10/11/2015 Yes exposed Inactive Problems Resolved Problems Electronic Signature(s) Signed: 10/17/2015 4:02:16 PM By: Christin Fudge MD, FACS Entered By: Christin Fudge on 10/17/2015 16:02:16 [place], [name] ([id number]) -------------------------------------------------------------------------------- Progress Note Details Patient Name: [name] Ramus L. Date of Service: 10/17/2015 3:45 PM Medical Record Number: [id number] Patient Account Number: [id number] Date of Birth/Sex: 13-May-1941 (74 y.o. Female) Treating RN: Macarthur Critchley Primary Care Physician: Delight Stare Other Clinician: Referring  Physician: Delight Stare Treating Physician/Extender: Frann Rider in Treatment: 0 Subjective Chief Complaint Information obtained from Patient Patients presents for treatment of an open diabetic ulcer. the patient has had swelling of both legs for about 3 months and recently developed a redness with pain and ulceration on the right lower extremity for about 3 weeks. History of Present Illness (HPI) The following HPI elements were documented for the patient's wound: Location: swelling both lower extremities and ulceration on the right lateral ankle Quality: Patient reports experiencing a dull pain to affected area(s). Severity: Patient states wound (s) are getting better. Duration: Patient has had the wound for < 4 weeks prior to presenting for treatment Timing: Pain in wound is Intermittent (comes and goes Context: The wound appeared gradually over time Modifying Factors: Other treatment(s) tried include:she received amoxicillin and doxycycline and also has been putting some local bandage. Associated Signs and Symptoms: Patient reports having increase swelling. 74 year old patient who comes with a referral for bilateral lower extremity edema and a lower extremity ulceration and has been sent by her PCP Dr. Placido Sou.  I understand the patient was recently put on amoxicillin and doxycycline but could not tolerate the amoxicillin. doxycycline course was completed. a BNP and EKG was supposed to be normal and the patient did not have any dyspnea. the patient has been on a diuretic. The patient was also prescribed a pair of elastic compression stockings of the 20-30 mmHg pressure variety. x-ray of the right ankle was done on 09/20/2015 and showed posttraumatic and postsurgical changes of the right ankle with secondary degenerative changes of the tibiotalar joint and to a lesser degree the subtalar joint. No definite acute bony abnormalities are noted. Past medical history significant  for diabetes mellitus, hypertension, hyperlipidemia, right breast cancer treated with a mastectomy in 2014. She has never smoked. Objective Constitutional [name], [name]. ([id number]) Pulse regular. Respirations normal and unlabored. Afebrile. Vitals Time Taken: 3:47 PM, Height: 63 in, Weight: 146 lbs, BMI: 25.9, Temperature: 98.2 F, Pulse: 73 bpm, Respiratory Rate: 16 breaths/min, Blood Pressure: 108/61 mmHg, Pulse Oximetry: 100 %. Eyes Nonicteric. Reactive to light. Ears, Nose, Mouth, and Throat Lips, teeth, and gums WNL.Marland Kitchen Moist mucosa without lesions . Neck supple and nontender. No palpable supraclavicular or cervical adenopathy. Normal sized without goiter. Respiratory WNL. No retractions.. Cardiovascular Pedal Pulses WNL. No clubbing, cyanosis or edema. Lymphatic No adneopathy. No adenopathy. No adenopathy. Musculoskeletal Adexa without tenderness or enlargement.. Digits and nails w/o clubbing, cyanosis, infection, petechiae, ischemia, or inflammatory conditions.Marland Kitchen Psychiatric Judgement and insight Intact.. No evidence of depression, anxiety, or agitation.. General Notes: one of the previous ulceration is healed and the other one is fairly shallow but present. Integumentary (Hair, Skin) No suspicious lesions. No crepitus or fluctuance. No peri-wound warmth or erythema. No masses.. Wound #1 status is Open. Original cause of wound was Gradually Appeared. The wound is located on the Right,Proximal,Lateral Malleolus. The wound measures 0.6cm length x 0.3cm width x 0.1cm depth; 0.141cm^2 area and 0.014cm^3 volume. The wound is limited to skin breakdown. There is no tunneling or undermining noted. There is a small amount of serous drainage noted. The wound margin is distinct with the outline attached to the wound base. There is medium (34-66%) pink, pale granulation within the wound bed. There is a small (1-33%) amount of necrotic tissue within the wound bed including Adherent  Slough. The periwound skin appearance exhibited: Localized Edema, Moist. The periwound skin appearance did not exhibit: Callus, Crepitus, Excoriation, Fluctuance, Friable, Induration, Rash, Scarring, Dry/Scaly, Maceration, Atrophie Blanche, Cyanosis, Ecchymosis, Hemosiderin Staining, Mottled, Pallor, Rubor, Erythema. Periwound temperature was noted as No Abnormality. Wound #2 status is Open. Original cause of wound was Gradually Appeared. The wound is located on the Right,Distal,Lateral Malleolus. The wound measures 0cm length x 0cm width x 0cm depth; 0cm^2 area and 0cm^3 volume. The wound is limited to skin breakdown. There is no tunneling or undermining noted. There [name], [name]. ([id number]) is a small amount of serous drainage noted. The wound margin is distinct with the outline attached to the wound base. There is large (67-100%) granulation within the wound bed. The periwound skin appearance exhibited: Localized Edema, Moist. The periwound skin appearance did not exhibit: Callus, Crepitus, Excoriation, Fluctuance, Friable, Induration, Rash, Scarring, Dry/Scaly, Maceration, Atrophie Blanche, Cyanosis, Ecchymosis, Hemosiderin Staining, Mottled, Pallor, Rubor, Erythema. Periwound temperature was noted as No Abnormality. Assessment Active Problems ICD-10 E11.622 - Type 2 diabetes mellitus with other skin ulcer I89.0 - Lymphedema, not elsewhere classified L97.312 - Non-pressure chronic ulcer of right ankle with fat layer exposed I have recommended Aquacel Ag with a 3 layer  Profore compression wrap and have discussed the management of lymphedema with elevation and exercise. Because of her husband surgery she has not done her vascular test yet but is waiting to his book the appointment. I have discussed her diagnosis and management at length and she has had all questions answered and will come back to see me next week. Plan Wound Cleansing: Wound #1 Right,Proximal,Lateral  Malleolus: Clean wound with Normal Saline. Skin Barriers/Peri-Wound Care: Wound #1 Right,Proximal,Lateral Malleolus: Moisturizing lotion Primary Wound Dressing: Wound #1 Right,Proximal,Lateral Malleolus: Aquacel Ag Secondary Dressing: Wound #1 Right,Proximal,Lateral Malleolus: ABD pad Dressing Change Frequency: Wound #1 Right,Proximal,Lateral Malleolus: [place], [place] ([id number]) Change dressing every week - Patient will come on Monday because husband is having surgery on tuesday Follow-up Appointments: Wound #1 Right,Proximal,Lateral Malleolus: Return Appointment in 1 week. Edema Control: Wound #1 Right,Proximal,Lateral Malleolus: 3 Layer Compression System - Right Lower Extremity I have recommended Aquacel Ag with a 3 layer Profore compression wrap and have discussed the management of lymphedema with elevation and exercise. Because of her husband surgery she has not done her vascular test yet but is waiting to his book the appointment. I have discussed her diagnosis and management at length and she has had all questions answered and will come back to see me next week. Electronic Signature(s) Signed: 10/17/2015 4:05:04 PM By: Christin Fudge MD, FACS Entered By: Christin Fudge on 10/17/2015 16:05:04 [place], [name] ([id number]) -------------------------------------------------------------------------------- SuperBill Details Patient Name: [name] Nims. Date of Service: 10/17/2015 Medical Record Number: [id number] Patient Account Number: [id number] Date of Birth/Sex: 04/28/1941 (74 y.o. Female) Treating RN: Macarthur Critchley Primary Care Physician: Delight Stare Other Clinician: Referring Physician: Delight Stare Treating Physician/Extender: Frann Rider in Treatment: 0 Diagnosis Coding ICD-10 Codes Code Description E11.622 Type 2 diabetes mellitus with other skin ulcer I89.0 Lymphedema, not elsewhere classified L97.312 Non-pressure chronic ulcer of  right ankle with fat layer exposed Facility Procedures CPT4: Description Modifier Quantity Code [id number] (Facility Use Only) [phone] - APPLY [place] [facility] 1 LEG Physician Procedures CPT4 Code Description: [id number] - WC PHYS LEVEL 3 - EST PT ICD-10 Description Diagnosis E11.622 Type 2 diabetes mellitus with other skin ulcer I89.0 Lymphedema, not elsewhere classified L97.312 Non-pressure chronic ulcer of right ankle with fat Modifier: layer expose Quantity: 1 d Electronic Signature(s) Signed: 10/17/2015 4:20:52 PM By: Ardean Larsen Signed: 10/17/2015 4:22:10 PM By: Christin Fudge MD, FACS Previous Signature: 10/17/2015 4:05:17 PM Version By: Christin Fudge MD, FACS Entered By: Rebecca Eaton RN, Roslynn Amble on 10/17/2015 16:15:10

## 2015-10-24 ENCOUNTER — Encounter: Payer: Medicare HMO | Admitting: Surgery

## 2015-10-24 DIAGNOSIS — E11622 Type 2 diabetes mellitus with other skin ulcer: Secondary | ICD-10-CM | POA: Diagnosis not present

## 2015-10-24 NOTE — Progress Notes (Addendum)
[name], [name] ([id number]) Visit Report for 10/24/2015 Chief Complaint Document Details Patient Name: [name], [name] 10/24/2015 3:30 Date of Service: PM Medical Record [id number] Number: Patient Account Number: [id number] 10-08-1941 (74 y.o. Treating RN: Cornell Barman Date of Birth/Sex: Female) Other Clinician: Primary Care Physician: MILES, LINDA Treating Jaylyne Breese Referring Physician: Delight Stare Physician/Extender: Suella Grove in Treatment: 1 Information Obtained from: Patient Chief Complaint Patients presents for treatment of an open diabetic ulcer. the patient has had swelling of both legs for about 3 months and recently developed a redness with pain and ulceration on the right lower extremity for about 3 weeks. Electronic Signature(s) Signed: 10/24/2015 4:01:45 PM By: Christin Fudge MD, FACS Entered By: Christin Fudge on 10/24/2015 16:01:45 [place], [name] ([id number]) -------------------------------------------------------------------------------- HPI Details Patient Name: [name] 10/24/2015 3:30 Date of Service: PM Medical Record [id number] Number: Patient Account Number: [id number] 1941-04-26 (74 y.o. Treating RN: Cornell Barman Date of Birth/Sex: Female) Other Clinician: Primary Care Physician: MILES, LINDA Treating Christin Fudge Referring Physician: Delight Stare Physician/Extender: Suella Grove in Treatment: 1 History of Present Illness Location: swelling both lower extremities and ulceration on the right lateral ankle Quality: Patient reports experiencing a dull pain to affected area(s). Severity: Patient states wound (s) are getting better. Duration: Patient has had the wound for < 4 weeks prior to presenting for treatment Timing: Pain in wound is Intermittent (comes and goes Context: The wound appeared gradually over time Modifying Factors: Other treatment(s) tried include:she received amoxicillin and doxycycline and also has been putting some  local bandage. Associated Signs and Symptoms: Patient reports having increase swelling. HPI Description: 74 year old patient who comes with a referral for bilateral lower extremity edema and a lower extremity ulceration and has been sent by her PCP Dr. Placido Sou. I understand the patient was recently put on amoxicillin and doxycycline but could not tolerate the amoxicillin. doxycycline course was completed. a BNP and EKG was supposed to be normal and the patient did not have any dyspnea. the patient has been on a diuretic. The patient was also prescribed a pair of elastic compression stockings of the 20-30 mmHg pressure variety. x-ray of the right ankle was done on 09/20/2015 and showed posttraumatic and postsurgical changes of the right ankle with secondary degenerative changes of the tibiotalar joint and to a lesser degree the subtalar joint. No definite acute bony abnormalities are noted. Past medical history significant for diabetes mellitus, hypertension, hyperlipidemia, right breast cancer treated with a mastectomy in 2014. She has never smoked. 10/24/2015 -- she had delayed her vascular test because of her husband surgery but she is now ready to get him taken care of. He is also unable to use compression stockings and hence we will need to order her Juzo wraps. Electronic Signature(s) Signed: 10/24/2015 4:02:18 PM By: Christin Fudge MD, FACS Entered By: Christin Fudge on 10/24/2015 16:02:18 [name] ([id number]) -------------------------------------------------------------------------------- Physical Exam Details Patient Name: [name], [name] 10/24/2015 3:30 Date of Service: PM Medical Record [id number] Number: Patient Account Number: [id number] 01-Oct-1941 (74 y.o. Treating RN: Cornell Barman Date of Birth/Sex: Female) Other Clinician: Primary Care Physician: MILES, LINDA Treating Christin Fudge Referring Physician: Delight Stare Physician/Extender: Weeks in  Treatment: 1 Constitutional . Pulse regular. Respirations normal and unlabored. Afebrile. . Eyes Nonicteric. Reactive to light. Ears, Nose, Mouth, and Throat Lips, teeth, and gums WNL.Marland Kitchen Moist mucosa without lesions . Neck supple and nontender. No palpable supraclavicular or cervical adenopathy. Normal sized without goiter. Respiratory WNL. No retractions.. Cardiovascular Pedal Pulses WNL. No clubbing,  cyanosis or edema. Lymphatic No adneopathy. No adenopathy. No adenopathy. Musculoskeletal Adexa without tenderness or enlargement.. Digits and nails w/o clubbing, cyanosis, infection, petechiae, ischemia, or inflammatory conditions.. Integumentary (Hair, Skin) No suspicious lesions. No crepitus or fluctuance. No peri-wound warmth or erythema. No masses.Marland Kitchen Psychiatric Judgement and insight Intact.. No evidence of depression, anxiety, or agitation.. Notes the ulceration is now very shallow and superficial and there is no surrounding edema. The healing is excellent. Electronic Signature(s) Signed: 10/24/2015 4:02:54 PM By: Christin Fudge MD, FACS Entered By: Christin Fudge on 10/24/2015 16:02:53 [place], [name] ([id number]) -------------------------------------------------------------------------------- Physician Orders Details Patient Name: [name], [name] 10/24/2015 3:30 Date of Service: PM Medical Record [id number] Number: Patient Account Number: [id number] 1941/04/14 (74 y.o. Treating RN: Cornell Barman Date of Birth/Sex: Female) Other Clinician: Primary Care Physician: MILES, LINDA Treating Arantxa Piercey Referring Physician: Delight Stare Physician/Extender: Suella Grove in Treatment: 1 Verbal / Phone Orders: Yes Clinician: Cornell Barman Read Back and Verified: Yes Diagnosis Coding Wound Cleansing Wound #1 Right,Proximal,Lateral Malleolus o Clean wound with Normal Saline. Skin Barriers/Peri-Wound Care Wound #1 Right,Proximal,Lateral Malleolus o Moisturizing  lotion Primary Wound Dressing o Other: - mepilex Secondary Dressing Wound #1 Right,Proximal,Lateral Malleolus o ABD pad Dressing Change Frequency Wound #1 Right,Proximal,Lateral Malleolus o Change dressing every week - Patient will come on Monday because husband is having surgery on tuesday Follow-up Appointments Wound #1 Right,Proximal,Lateral Malleolus o Return Appointment in 1 week. Edema Control Wound #1 Right,Proximal,Lateral Malleolus o 3 Layer Compression System - Right Lower Extremity o Other: - Order Juzo compression wrap Electronic Signature(s) Signed: 10/24/2015 4:36:00 PM By: Christin Fudge MD, FACS Signed: 10/24/2015 6:01:17 PM By: Gretta Cool RN, BSN, Kim RN, BSN [name], [name] ([id number]) Entered By: Gretta Cool, RN, BSN, Kim on 10/24/2015 16:11:05 [name], [name] ([id number]) -------------------------------------------------------------------------------- Problem List Details Patient Name: [name], [name] 10/24/2015 3:30 Date of Service: PM Medical Record [id number] Number: Patient Account Number: [id number] [birth year] (74 y.o. Treating RN: Cornell Barman Date of Birth/Sex: Female) Other Clinician: Primary Care Physician: MILES, LINDA Treating Christin Fudge Referring Physician: Delight Stare Physician/Extender: Suella Grove in Treatment: 1 Active Problems ICD-10 Encounter Code Description Active Date Diagnosis E11.622 Type 2 diabetes mellitus with other skin ulcer 10/11/2015 Yes I89.0 Lymphedema, not elsewhere classified 10/11/2015 Yes L97.312 Non-pressure chronic ulcer of right ankle with fat layer 10/11/2015 Yes exposed Inactive Problems Resolved Problems Electronic Signature(s) Signed: 10/24/2015 4:01:32 PM By: Christin Fudge MD, FACS Entered By: Christin Fudge on 10/24/2015 16:01:31 [place], [name] ([id number]) -------------------------------------------------------------------------------- Progress Note Details Patient Name: [name]. 10/24/2015 3:30 Date of Service: PM Medical Record [id number] Number: Patient Account Number: [id number] 1941-08-17 (74 y.o. Treating RN: Cornell Barman Date of Birth/Sex: Female) Other Clinician: Primary Care Physician: MILES, LINDA Treating Christin Fudge Referring Physician: Delight Stare Physician/Extender: Suella Grove in Treatment: 1 Subjective Chief Complaint Information obtained from Patient Patients presents for treatment of an open diabetic ulcer. the patient has had swelling of both legs for about 3 months and recently developed a redness with pain and ulceration on the right lower extremity for about 3 weeks. History of Present Illness (HPI) The following HPI elements were documented for the patient's wound: Location: swelling both lower extremities and ulceration on the right lateral ankle Quality: Patient reports experiencing a dull pain to affected area(s). Severity: Patient states wound (s) are getting better. Duration: Patient has had the wound for < 4 weeks prior to presenting for treatment Timing: Pain in wound is Intermittent (comes and goes Context: The wound appeared gradually over time Modifying Factors:  Other treatment(s) tried include:she received amoxicillin and doxycycline and also has been putting some local bandage. Associated Signs and Symptoms: Patient reports having increase swelling. 74 year old patient who comes with a referral for bilateral lower extremity edema and a lower extremity ulceration and has been sent by her PCP Dr. Placido Sou. I understand the patient was recently put on amoxicillin and doxycycline but could not tolerate the amoxicillin. doxycycline course was completed. a BNP and EKG was supposed to be normal and the patient did not have any dyspnea. the patient has been on a diuretic. The patient was also prescribed a pair of elastic compression stockings of the 20-30 mmHg pressure variety. x-ray of the right ankle was done on  09/20/2015 and showed posttraumatic and postsurgical changes of the right ankle with secondary degenerative changes of the tibiotalar joint and to a lesser degree the subtalar joint. No definite acute bony abnormalities are noted. Past medical history significant for diabetes mellitus, hypertension, hyperlipidemia, right breast cancer treated with a mastectomy in 2014. She has never smoked. 10/24/2015 -- she had delayed her vascular test because of her husband surgery but she is now ready to get him taken care of. He is also unable to use compression stockings and hence we will need to order her Juzo wraps. [name], [name]. ([id number]) Objective Constitutional Pulse regular. Respirations normal and unlabored. Afebrile. Vitals Time Taken: 3:41 PM, Height: 63 in, Weight: 146 lbs, BMI: 25.9, Temperature: 98.0 F, Pulse: 77 bpm, Respiratory Rate: 18 breaths/min, Blood Pressure: 110/68 mmHg. Eyes Nonicteric. Reactive to light. Ears, Nose, Mouth, and Throat Lips, teeth, and gums WNL.Marland Kitchen Moist mucosa without lesions . Neck supple and nontender. No palpable supraclavicular or cervical adenopathy. Normal sized without goiter. Respiratory WNL. No retractions.. Cardiovascular Pedal Pulses WNL. No clubbing, cyanosis or edema. Lymphatic No adneopathy. No adenopathy. No adenopathy. Musculoskeletal Adexa without tenderness or enlargement.. Digits and nails w/o clubbing, cyanosis, infection, petechiae, ischemia, or inflammatory conditions.Marland Kitchen Psychiatric Judgement and insight Intact.. No evidence of depression, anxiety, or agitation.. General Notes: the ulceration is now very shallow and superficial and there is no surrounding edema. The healing is excellent. Integumentary (Hair, Skin) No suspicious lesions. No crepitus or fluctuance. No peri-wound warmth or erythema. No masses.. Wound #1 status is Open. Original cause of wound was Gradually Appeared. The wound is located on  the Right,Proximal,Lateral Malleolus. The wound measures 0.5cm length x 0.2cm width x 0.1cm depth; 0.079cm^2 area and 0.008cm^3 volume. The wound is limited to skin breakdown. There is a small amount of serous drainage noted. The wound margin is distinct with the outline attached to the wound base. There is medium (34-66%) pink, pale granulation within the wound bed. There is a small (1-33%) amount of necrotic tissue within the wound bed including Adherent Slough. The periwound skin appearance exhibited: [name], [name]. ([id number]) Localized Edema, Moist. The periwound skin appearance did not exhibit: Callus, Crepitus, Excoriation, Fluctuance, Friable, Induration, Rash, Scarring, Dry/Scaly, Maceration, Atrophie Blanche, Cyanosis, Ecchymosis, Hemosiderin Staining, Mottled, Pallor, Rubor, Erythema. Periwound temperature was noted as No Abnormality. 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 Skin Barriers/Peri-Wound Care: Wound #1 Right,Proximal,Lateral Malleolus: Moisturizing lotion Primary Wound Dressing: Other: - mepilex Secondary Dressing: Wound #1 Right,Proximal,Lateral Malleolus: ABD pad Dressing Change Frequency: Wound #1 Right,Proximal,Lateral Malleolus: Change dressing every week -  Patient will come on Monday because husband is having surgery on tuesday Follow-up Appointments: Wound #1 Right,Proximal,Lateral Malleolus: Return Appointment in 1 week. Edema Control: Wound #1 Right,Proximal,Lateral Malleolus: 3 Layer Compression System - Right Lower Extremity Other: - Order Juzo compression wrap [name], [name]. ([id number]) I have recommended Aquacel Ag with a 3 layer Profore compression wrap and have  discussed the management of lymphedema with elevation and exercise. She will also schedule her vascular test and in anticipation of her healing we will order some Juzo compression wraps which she will bring with her the next week. Electronic Signature(s) Signed: 10/25/2015 3:01:09 PM By: Christin Fudge MD, FACS Previous Signature: 10/24/2015 4:03:44 PM Version By: Christin Fudge MD, FACS Entered By: Christin Fudge on 10/25/2015 15:01:09 [place], [name] ([id number]) -------------------------------------------------------------------------------- SuperBill Details Patient Name: [name] Ramus L. Date of Service: 10/24/2015 Medical Record Number: [id number] Patient Account Number: [id number] Date of Birth/Sex: [birth year] (74 y.o. Female) Treating RN: Cornell Barman Primary Care Physician: Delight Stare Other Clinician: Referring Physician: Delight Stare Treating Physician/Extender: Frann Rider in Treatment: 1 Diagnosis Coding ICD-10 Codes Code Description E11.622 Type 2 diabetes mellitus with other skin ulcer I89.0 Lymphedema, not elsewhere classified L97.312 Non-pressure chronic ulcer of right ankle with fat layer exposed Facility Procedures CPT4: Description Modifier Quantity Code [id number] (Facility Use Only) [phone] - APPLY Waves RT 1 LEG Physician Procedures CPT4 Code Description: [id number] - WC PHYS LEVEL 3 - EST PT ICD-10 Description Diagnosis E11.622 Type 2 diabetes mellitus with other skin ulcer I89.0 Lymphedema, not elsewhere classified L97.312 Non-pressure chronic ulcer of right ankle with fat Modifier: layer expose Quantity: 1 d Electronic Signature(s) Signed: 10/24/2015 4:36:00 PM By: Christin Fudge MD, FACS Signed: 10/24/2015 6:01:17 PM By: Gretta Cool RN, BSN, Kim RN, BSN Previous Signature: 10/24/2015 4:04:03 PM Version By: Christin Fudge MD, FACS Entered By: Gretta Cool RN, BSN, Kim on 10/24/2015 16:16:00

## 2015-10-25 NOTE — Progress Notes (Signed)
TAJE, LILL ([id number]) Visit Report for 10/24/2015 Arrival Information Details Patient Name: [name], [name]. Date of Service: 10/24/2015 3:30 PM Medical Record Number: [id number] Patient Account Number: [id number] Date of Birth/Sex: Feb 25, 1941 (74 y.o. Female) Treating RN: Cornell Barman Primary Care Physician: Delight Stare Other Clinician: Referring Physician: Delight Stare Treating Physician/Extender: Frann Rider in Treatment: 1 Visit Information History Since Last Visit Added or deleted any medications: No Patient Arrived: [name] Any new allergies or adverse reactions: No Arrival Time: 15:40 Had a fall or experienced change in No Accompanied By: son activities of daily living that may affect Transfer Assistance: None risk of falls: Patient Identification Verified: Yes Signs or symptoms of abuse/neglect since last No Secondary Verification Process Yes visito Completed: Hospitalized since last visit: No Patient Requires Transmission- No Has Dressing in Place as Prescribed: Yes Based Precautions: Has Compression in Place as Prescribed: Yes Patient Has Alerts: Yes Pain Present Now: No Patient Alerts: Patient on Blood Thinner Aspirin Electronic Signature(s) Signed: 10/24/2015 6:01:17 PM By: Gretta Cool, RN, BSN, Kim RN, BSN Entered By: Gretta Cool, RN, BSN, Kim on 10/24/2015 15:41:26 [name], [name] ([id number]) -------------------------------------------------------------------------------- Encounter Discharge Information Details Patient Name: [name] Ramus L. Date of Service: 10/24/2015 3:30 PM Medical Record Number: [id number] Patient Account Number: [id number] Date of Birth/Sex: 1941/08/27 (74 y.o. Female) Treating RN: Cornell Barman Primary Care Physician: Delight Stare Other Clinician: Referring Physician: Delight Stare Treating Physician/Extender: Frann Rider in Treatment: 1 Encounter Discharge Information Items Discharge Pain Level: 0 Discharge  Condition: Stable Ambulatory Status: Cane Discharge Destination: Home Transportation: Private Auto Accompanied By: self Schedule Follow-up Appointment: Yes Medication Reconciliation completed and provided to Patient/Care No [name]: Provided on Clinical Summary of Care: 10/24/2015 Form Type Recipient Paper Patient DM Electronic Signature(s) Signed: 10/24/2015 6:01:17 PM By: Gretta Cool RN, BSN, Kim RN, BSN Previous Signature: 10/24/2015 4:11:20 PM Version By: Ruthine Dose Entered By: Gretta Cool RN, BSN, Kim on 10/24/2015 16:16:52 [name], [name] ([id number]) -------------------------------------------------------------------------------- Lower Extremity Assessment Details Patient Name: [name] Ramus L. Date of Service: 10/24/2015 3:30 PM Medical Record Number: [id number] Patient Account Number: [id number] Date of Birth/Sex: [birth year] (74 y.o. Female) Treating RN: Cornell Barman Primary Care Physician: Delight Stare Other Clinician: Referring Physician: Delight Stare Treating Physician/Extender: Frann Rider in Treatment: 1 Edema Assessment Assessed: [Left: No] [Right: No] Edema: [Left: Ye] [Right: s] Calf Left: Right: Point of Measurement: 30 cm From Medial Instep cm 33 cm Ankle Left: Right: Point of Measurement: 10 cm From Medial Instep cm 22 cm Vascular Assessment Pulses: Posterior Tibial Dorsalis Pedis Palpable: [Right:Yes] Extremity colors, hair growth, and conditions: Extremity Color: [Right:Hyperpigmented] Hair Growth on Extremity: [Right:No] Temperature of Extremity: [Right:Warm] Capillary Refill: [Right:< 3 seconds] Toe Nail Assessment Left: Right: Thick: No Discolored: No Deformed: No Improper Length and Hygiene: No Electronic Signature(s) Signed: 10/24/2015 6:01:17 PM By: Gretta Cool, RN, BSN, Kim RN, BSN Entered By: Gretta Cool, RN, BSN, Kim on 10/24/2015 15:44:14 [name], [name]  ([id number]) -------------------------------------------------------------------------------- Multi Wound Chart Details Patient Name: [name] Ramus L. Date of Service: 10/24/2015 3:30 PM Medical Record Number: [id number] Patient Account Number: [id number] Date of Birth/Sex: 23-Oct-1941 (74 y.o. Female) Treating RN: Cornell Barman Primary Care Physician: Delight Stare Other Clinician: Referring Physician: Delight Stare Treating Physician/Extender: Frann Rider in Treatment: 1 Vital Signs Height(in): 63 Pulse(bpm): 77 Weight(lbs): 146 Blood Pressure 110/68 (mmHg): Body Mass Index(BMI): 26 Temperature(F): 98.0 Respiratory Rate 18 (breaths/min): Photos: [1:No Photos] [N/A:N/A] Wound Location: [1:Right Malleolus - Lateral, Proximal] [N/A:N/A] Wounding Event: [1:Gradually Appeared] [N/A:N/A] Primary Etiology: [1:Cellulitis] [N/A:N/A] Comorbid History: [  1:Cataracts, Hypertension, Type II Diabetes, Osteoarthritis] [N/A:N/A] Date Acquired: [1:09/11/2015] [N/A:N/A] Weeks of Treatment: [1:1] [N/A:N/A] Wound Status: [1:Open] [N/A:N/A] Measurements L x W x D 0.5x0.2x0.1 [N/A:N/A] (cm) Area (cm) : [1:0.079] [N/A:N/A] Volume (cm) : [1:0.008] [N/A:N/A] % Reduction in Area: [1:74.80%] [N/A:N/A] % Reduction in Volume: 74.20% [N/A:N/A] Classification: [1:Partial Thickness] [N/A:N/A] HBO Classification: [1:Grade 1] [N/A:N/A] Exudate Amount: [1:Small] [N/A:N/A] Exudate Type: [1:Serous] [N/A:N/A] Exudate Color: [1:amber] [N/A:N/A] Wound Margin: [1:Distinct, outline attached] [N/A:N/A] Granulation Amount: [1:Medium (34-66%)] [N/A:N/A] Granulation Quality: [1:Pink, Pale] [N/A:N/A] Necrotic Amount: [1:Small (1-33%)] [N/A:N/A] Exposed Structures: [1:Fascia: No Fat: No Tendon: No Muscle: No] [N/A:N/A] Joint: No Bone: No Limited to Skin Breakdown Epithelialization: None N/A N/A Periwound Skin Texture: Edema: Yes N/A N/A Excoriation: No Induration: No Callus: No Crepitus:  No Fluctuance: No Friable: No Rash: No Scarring: No Periwound Skin Moist: Yes N/A N/A Moisture: Maceration: No Dry/Scaly: No Periwound Skin Color: Atrophie Blanche: No N/A N/A Cyanosis: No Ecchymosis: No Erythema: No Hemosiderin Staining: No Mottled: No Pallor: No Rubor: No Temperature: No Abnormality N/A N/A Tenderness on No N/A N/A Palpation: Wound Preparation: Ulcer Cleansing: N/A N/A Rinsed/Irrigated with Saline Topical Anesthetic Applied: Other: lidocaine 4% Treatment Notes Electronic Signature(s) Signed: 10/24/2015 6:01:17 PM By: Gretta Cool, RN, BSN, Kim RN, BSN Entered By: Gretta Cool, RN, BSN, Kim on 10/24/2015 15:52:36 [name], [name] ([id number]) -------------------------------------------------------------------------------- [place] Details Patient Name: [name], BYFIELD. Date of Service: 10/24/2015 3:30 PM Medical Record Number: [id number] Patient Account Number: [id number] Date of Birth/Sex: 1941/01/01 (74 y.o. Female) Treating RN: Cornell Barman Primary Care Physician: Delight Stare Other Clinician: Referring Physician: Delight Stare Treating Physician/Extender: Frann Rider in Treatment: 1 Active Inactive Orientation to the Wound Care Program Nursing Diagnoses: Knowledge deficit related to the wound healing center program Goals: Patient/caregiver will verbalize understanding of the [place] Program Date Initiated: 10/11/2015 Goal Status: Active Interventions: Provide education on orientation to the wound center Notes: Wound/Skin Impairment Nursing Diagnoses: Impaired tissue integrity Knowledge deficit related to ulceration/compromised skin integrity Goals: Patient/caregiver will verbalize understanding of skin care regimen Date Initiated: 10/11/2015 Goal Status: Active Ulcer/skin breakdown will have a volume reduction of 30% by week 4 Date Initiated: 10/11/2015 Goal Status: Active Ulcer/skin breakdown will  have a volume reduction of 50% by week 8 Date Initiated: 10/11/2015 Goal Status: Active Ulcer/skin breakdown will have a volume reduction of 80% by week 12 Date Initiated: 10/11/2015 Goal Status: Active Ulcer/skin breakdown will heal within 14 weeks Date Initiated: 10/11/2015 [name], [name] ([id number]) Goal Status: Active Interventions: Assess patient/caregiver ability to obtain necessary supplies Assess patient/caregiver ability to perform ulcer/skin care regimen upon admission and as needed Assess ulceration(s) every visit Provide education on ulcer and skin care Treatment Activities: Referred to DME [facility] for dressing supplies : 10/24/2015 Skin care regimen initiated : 10/24/2015 Topical wound management initiated : 10/24/2015 Notes: Electronic Signature(s) Signed: 10/24/2015 6:01:17 PM By: Gretta Cool, RN, BSN, Kim RN, BSN Entered By: Gretta Cool, RN, BSN, Kim on 10/24/2015 15:51:24 [name], [name] ([id number]) -------------------------------------------------------------------------------- Pain Assessment Details Patient Name: [name] Ramus L. Date of Service: 10/24/2015 3:30 PM Medical Record Number: [id number] Patient Account Number: [id number] Date of Birth/Sex: 06-04-1941 (74 y.o. Female) Treating RN: Cornell Barman Primary Care Physician: Delight Stare Other Clinician: Referring Physician: Delight Stare Treating Physician/Extender: Frann Rider in Treatment: 1 Active Problems Location of Pain Severity and Description of Pain Patient Has Paino No Site Locations Pain Management and Medication Current Pain Management: Electronic Signature(s) Signed: 10/24/2015 6:01:17 PM By: Gretta Cool, RN, BSN, Kim RN, BSN  Entered By: Gretta Cool, RN, BSN, Kim on 10/24/2015 15:41:31 [name], [name] ([id number]) -------------------------------------------------------------------------------- Patient/Caregiver Education Details Patient Name: [name] Date of Service:  10/24/2015 3:30 PM Medical Record Number: [id number] Patient Account Number: [id number] Date of Birth/Gender: 01/01/1941 (74 y.o. Female) Treating RN: Cornell Barman Primary Care Physician: Delight Stare Other Clinician: Referring Physician: Delight Stare Treating Physician/Extender: Frann Rider in Treatment: 1 Education Assessment Education Provided To: Patient Education Topics Provided Wound/Skin Impairment: Handouts: Caring for Your Ulcer Methods: Demonstration Responses: State content correctly Electronic Signature(s) Signed: 10/24/2015 6:01:17 PM By: Gretta Cool, RN, BSN, Kim RN, BSN Entered By: Gretta Cool, RN, BSN, Kim on 10/24/2015 16:17:02 [name], [name] ([id number]) -------------------------------------------------------------------------------- Wound Assessment Details Patient Name: [name] Ramus L. Date of Service: 10/24/2015 3:30 PM Medical Record Number: [id number] Patient Account Number: [id number] Date of Birth/Sex: 1941/06/08 (74 y.o. Female) Treating RN: Cornell Barman Primary Care Physician: Delight Stare Other Clinician: Referring Physician: Delight Stare Treating Physician/Extender: Frann Rider in Treatment: 1 Wound Status Wound Number: 1 Primary Cellulitis Etiology: Wound Location: Right Malleolus - Lateral, Proximal Wound Open Status: Wounding Event: Gradually Appeared Comorbid Cataracts, Hypertension, Type II Date Acquired: 09/11/2015 History: Diabetes, Osteoarthritis Weeks Of Treatment: 1 Clustered Wound: No Wound Measurements Length: (cm) 0.5 Width: (cm) 0.2 Depth: (cm) 0.1 Area: (cm) 0.079 Volume: (cm) 0.008 % Reduction in Area: 74.8% % Reduction in Volume: 74.2% Epithelialization: None Wound Description Classification: Partial Thickness Diabetic Severity (Wagner): Grade 1 Wound Margin: Distinct, outline attache Exudate Amount: Small Exudate Type: Serous Exudate Color: amber Foul Odor After Cleansing: No d Wound  Bed Granulation Amount: Medium (34-66%) Exposed Structure Granulation Quality: Pink, Pale Fascia Exposed: No Necrotic Amount: Small (1-33%) Fat Layer Exposed: No Necrotic Quality: Adherent Slough Tendon Exposed: No Muscle Exposed: No Joint Exposed: No Bone Exposed: No Limited to Skin Breakdown Periwound Skin Texture Texture Color No Abnormalities Noted: No No Abnormalities Noted: No Callus: No Atrophie Blanche: No Crepitus: No Cyanosis: No [name], [name]. ([id number]) Excoriation: No Ecchymosis: No Fluctuance: No Erythema: No Friable: No Hemosiderin Staining: No Induration: No Mottled: No Localized Edema: Yes Pallor: No Rash: No Rubor: No Scarring: No Temperature / Pain Moisture Temperature: No Abnormality No Abnormalities Noted: No Dry / Scaly: No Maceration: No Moist: Yes Wound Preparation Ulcer Cleansing: Rinsed/Irrigated with Saline Topical Anesthetic Applied: Other: lidocaine 4%, Treatment Notes Wound #1 (Right, Proximal, Lateral Malleolus) 1. Cleansed with: Cleanse wound with antibacterial soap and water 2. Anesthetic Topical Lidocaine 4% cream to wound bed prior to debridement 4. Dressing Applied: Other dressing (specify in notes) 5. Secondary Dressing Applied ABD Pad 7. Secured with 3 Layer Compression System - Right Lower Extremity Electronic Signature(s) Signed: 10/24/2015 6:01:17 PM By: Gretta Cool, RN, BSN, Kim RN, BSN Entered By: Gretta Cool, RN, BSN, Kim on 10/24/2015 15:51:16 [name], [name] ([id number]) -------------------------------------------------------------------------------- [place] Details Patient Name: [name]. Date of Service: 10/24/2015 3:30 PM Medical Record Number: [id number] Patient Account Number: [id number] Date of Birth/Sex: 1941-07-20 (74 y.o. Female) Treating RN: Cornell Barman Primary Care Physician: Delight Stare Other Clinician: Referring Physician: Delight Stare Treating Physician/Extender: Frann Rider in Treatment: 1 Vital Signs Time Taken: 15:41 Temperature (F): 98.0 Height (in): 63 Pulse (bpm): 77 Weight (lbs): 146 Respiratory Rate (breaths/min): 18 Body Mass Index (BMI): 25.9 Blood Pressure (mmHg): 110/68 Reference Range: 80 - 120 mg / dl Electronic Signature(s) Signed: 10/24/2015 6:01:17 PM By: Gretta Cool, RN, BSN, Kim RN, BSN Entered By: Gretta Cool, RN, BSN, Kim on 10/24/2015 15:43:00

## 2015-10-28 DIAGNOSIS — E11622 Type 2 diabetes mellitus with other skin ulcer: Secondary | ICD-10-CM | POA: Diagnosis not present

## 2015-10-29 NOTE — Progress Notes (Signed)
JARYIA, HYAMS ([id number]) Visit Report for 10/28/2015 Arrival Information Details Patient Name: CRESSA, DISSINGER 10/28/2015 2:15 Date of Service: PM Medical Record [id number] Number: Patient Account Number: [id number] 1941/03/14 (74 y.o. Treating RN: Montey Hora Date of Birth/Sex: Female) Other Clinician: Primary Care Physician: MILES, LINDA Treating Britto, Errol Referring Physician: Delight Stare Physician/Extender: Suella [place] in Treatment: 2 Visit Information History Since Last Visit Added or deleted any medications: No Patient Arrived: Cane Any new allergies or adverse reactions: No Arrival Time: 14:49 Had a fall or experienced change in No Accompanied By: son activities of daily living that may affect Transfer Assistance: None risk of falls: Patient Identification Verified: Yes Signs or symptoms of abuse/neglect since last No Secondary Verification Process Yes visito Completed: Hospitalized since last visit: No Patient Requires Transmission- No Pain Present Now: No Based Precautions: Patient Has Alerts: Yes Patient Alerts: Patient on Blood Thinner Aspirin Electronic Signature(s) Signed: 10/28/2015 5:02:54 PM By: Montey Hora Entered By: Montey Hora on 10/28/2015 15:02:02 [place], [name] ([id number]) -------------------------------------------------------------------------------- Encounter Discharge Information Details Patient Name: BRIERRA, DRAWHORN 10/28/2015 2:15 Date of Service: PM Medical Record [id number] Number: Patient Account Number: [id number] 09-29-1941 (74 y.o. Treating RN: Montey Hora Date of Birth/Sex: Female) Other Clinician: Primary Care Physician: MILES, LINDA Treating Britto, Errol Referring Physician: Delight Stare Physician/Extender: Suella [place] in Treatment: 2 Encounter Discharge Information Items Facility Notification Discharge Pain Level: 0 Telephoned: Yes Discharge Condition: Stable Orders Sent: Yes Ambulatory  Status: Cane Additional Notification Discharge Destination: Home Telephoned: Yes Private Transportation: Orders Sent: Yes Auto Accompanied By: son Schedule Follow-up Appointment: Yes Medication Reconciliation completed and No provided to Patient/Care Shellia Hartl: Clinical Summary of Care: Electronic Signature(s) Signed: 10/28/2015 5:02:54 PM By: Montey Hora Entered By: Montey Hora on 10/28/2015 15:03:08 [place], [name] ([id number]) -------------------------------------------------------------------------------- Patient/Caregiver Education Details Patient Name: DYLANIE, BANDEMER 10/28/2015 2:15 Date of Service: PM Medical Record [id number] Number: Patient Account Number: [id number] 12-25-1940 (74 y.o. Treating RN: Montey Hora Date of Birth/Gender: Female) Other Clinician: Primary Care Physician: MILES, LINDA Treating Britto, Errol Referring Physician: Delight Stare Physician/Extender: Suella [place] in Treatment: 2 Education Assessment Education Provided To: Patient and Caregiver Education Topics Provided Venous: Handouts: Other: bring juzo wrap to visit on monday Methods: Explain/Verbal Responses: State content correctly Electronic Signature(s) Signed: 10/28/2015 5:02:54 PM By: Montey Hora Entered By: Montey Hora on 10/28/2015 15:02:57

## 2015-10-31 ENCOUNTER — Encounter: Payer: Medicare HMO | Admitting: Surgery

## 2015-10-31 DIAGNOSIS — E11622 Type 2 diabetes mellitus with other skin ulcer: Secondary | ICD-10-CM | POA: Diagnosis not present

## 2015-11-03 NOTE — Progress Notes (Signed)
THEDORA, PANCOAST ([id number]) Visit Report for 10/31/2015 Chief Complaint Document Details Patient Name: [name], [name] 10/31/2015 3:30 Date of Service: PM Medical Record [id number] Number: Patient Account Number: [id number] 05/27/1941 (75 y.o. Treating RN: Ahmed Prima Date of Birth/Sex: Female) Other Clinician: Primary Care Physician: MILES, LINDA Treating Mercades Bajaj Referring Physician: Delight Stare Physician/Extender: Suella Grove in Treatment: 2 Information Obtained from: Patient Chief Complaint Patients presents for treatment of an open diabetic ulcer. the patient has had swelling of both legs for about 3 months and recently developed a redness with pain and ulceration on the right lower extremity for about 3 weeks. Electronic Signature(s) Signed: 11/01/2015 3:56:04 PM By: Christin Fudge MD, FACS Entered By: Christin Fudge on 11/01/2015 15:56:03 [place], [name] ([id number]) -------------------------------------------------------------------------------- HPI Details Patient Name: [name], [name] 10/31/2015 3:30 Date of Service: PM Medical Record [id number] Number: Patient Account Number: [id number] 04-Jun-1941 (74 y.o. Treating RN: Ahmed Prima Date of Birth/Sex: Female) Other Clinician: Primary Care Physician: MILES, LINDA Treating Parthiv Mucci Referring Physician: Delight Stare Physician/Extender: Suella Grove in Treatment: 2 History of Present Illness Location: swelling both lower extremities and ulceration on the right lateral ankle Quality: Patient reports experiencing a dull pain to affected area(s). Severity: Patient states wound (s) are getting better. Duration: Patient has had the wound for < 4 weeks prior to presenting for treatment Timing: Pain in wound is Intermittent (comes and goes Context: The wound appeared gradually over time Modifying Factors: Other treatment(s) tried include:she received amoxicillin and doxycycline and also has been  putting some local bandage. Associated Signs and Symptoms: Patient reports having increase swelling. HPI Description: 74 year old patient who comes with a referral for bilateral lower extremity edema and a lower extremity ulceration and has been sent by her PCP Dr. Placido Sou. I understand the patient was recently put on amoxicillin and doxycycline but could not tolerate the amoxicillin. doxycycline course was completed. a BNP and EKG was supposed to be normal and the patient did not have any dyspnea. the patient has been on a diuretic. The patient was also prescribed a pair of elastic compression stockings of the 20-30 mmHg pressure variety. x-ray of the right ankle was done on 09/20/2015 and showed posttraumatic and postsurgical changes of the right ankle with secondary degenerative changes of the tibiotalar joint and to a lesser degree the subtalar joint. No definite acute bony abnormalities are noted. Past medical history significant for diabetes mellitus, hypertension, hyperlipidemia, right breast cancer treated with a mastectomy in 2014. She has never smoked. 10/24/2015 -- she had delayed her vascular test because of her husband surgery but she is now ready to get him taken care of. He is also unable to use compression stockings and hence we will need to order her Juzo wraps. 10/31/2015-- was seen by Dr. Lucky Cowboy on 10/28/2015. She had a left lower extremity arterial duplex done at his office a couple of years ago and that was essentially normal. Today they performed a venous duplex which revealed no evidence of deep vein thrombosis, superficial thrombophlebitis, no venous reflex was seen on the right and a minimal amount of reflux was seen on the left great saphenous vein but no significant reflux was seen. Impression was that there was a component of lymphedema present from a previous surgery and he would recommend compression stockings and leg elevation. Electronic Signature(s) Signed:  11/01/2015 3:57:39 PM By: Christin Fudge MD, FACS Entered By: Christin Fudge on 11/01/2015 15:57:39 [place], [name] ([id number]) [place], [name] ([id number]) -------------------------------------------------------------------------------- Physical Exam Details Patient Name:  [name], [name]. 10/31/2015 3:30 Date of Service: PM Medical Record [id number] Number: Patient Account Number: [id number] 1941-09-05 (74 y.o. Treating RN: Ahmed Prima Date of Birth/Sex: Female) Other Clinician: Primary Care Physician: MILES, LINDA Treating Magali Bray Referring Physician: Delight Stare Physician/Extender: Weeks in Treatment: 2 Constitutional . Pulse regular. Respirations normal and unlabored. Afebrile. . Eyes Nonicteric. Reactive to light. Ears, Nose, Mouth, and Throat Lips, teeth, and gums WNL.Marland Kitchen Moist mucosa without lesions. Neck supple and nontender. No palpable supraclavicular or cervical adenopathy. Normal sized without goiter. Respiratory WNL. No retractions.. Cardiovascular Pedal Pulses WNL. No clubbing, cyanosis or edema. Lymphatic No adneopathy. No adenopathy. No adenopathy. Musculoskeletal Adexa without tenderness or enlargement.. Digits and nails w/o clubbing, cyanosis, infection, petechiae, ischemia, or inflammatory conditions.. Integumentary (Hair, Skin) No suspicious lesions. No crepitus or fluctuance. No peri-wound warmth or erythema. No masses.Marland Kitchen Psychiatric Judgement and insight Intact.. No evidence of depression, anxiety, or agitation.. Notes there was a eschar over the wound and after gently removing this the wound is completely healed and there is no open ulceration. Electronic Signature(s) Signed: 11/01/2015 3:58:15 PM By: Christin Fudge MD, FACS Entered By: Christin Fudge on 11/01/2015 15:58:15 [name] Nims ([id number]) -------------------------------------------------------------------------------- Physician Orders Details Patient Name:  [name], [name] 10/31/2015 3:30 Date of Service: PM Medical Record [id number] Number: Patient Account Number: [id number] Jun 02, 1941 (74 y.o. Treating RN: Ahmed Prima Date of Birth/Sex: Female) Other Clinician: Primary Care Physician: MILES, LINDA Treating Visente Kirker Referring Physician: Delight Stare Physician/Extender: Suella Grove in Treatment: 2 Verbal / Phone Orders: Yes ClinicianCarolyne Fiscal, Debi Read Back and Verified: Yes Diagnosis Coding Edema Control o Elevate legs to the level of the heart and pump ankles as often as possible o Other: - place juxtalite on pts right leg Discharge From [facility] o Discharge from [facility] Signature(s) Signed: 11/01/2015 4:08:32 PM By: Christin Fudge MD, FACS Signed: 11/02/2015 4:19:08 PM By: Alric Quan Entered By: Alric Quan on 10/31/2015 16:35:30 [place], [name] ([id number]) -------------------------------------------------------------------------------- Problem List Details Patient Name: [name], [name] 10/31/2015 3:30 Date of Service: PM Medical Record [id number] Number: Patient Account Number: [id number] August 04, 1941 (74 y.o. Treating RN: Ahmed Prima Date of Birth/Sex: Female) Other Clinician: Primary Care Physician: MILES, LINDA Treating Dessie Delcarlo Referring Physician: Delight Stare Physician/Extender: Suella Grove in Treatment: 2 Active Problems ICD-10 Encounter Code Description Active Date Diagnosis E11.622 Type 2 diabetes mellitus with other skin ulcer 10/11/2015 Yes I89.0 Lymphedema, not elsewhere classified 10/11/2015 Yes L97.312 Non-pressure chronic ulcer of right ankle with fat layer 10/11/2015 Yes exposed Inactive Problems Resolved Problems Electronic Signature(s) Signed: 11/01/2015 3:55:45 PM By: Christin Fudge MD, FACS Entered By: Christin Fudge on 11/01/2015 15:55:45 [place], [name]  ([id number]) -------------------------------------------------------------------------------- Progress Note Details Patient Name: [name] Nims. 10/31/2015 3:30 Date of Service: PM Medical Record [id number] Number: Patient Account Number: [id number] 03-04-1941 (74 y.o. Treating RN: Ahmed Prima Date of Birth/Sex: Female) Other Clinician: Primary Care Physician: MILES, LINDA Treating Desree Leap Referring Physician: Delight Stare Physician/Extender: Suella Grove in Treatment: 2 Subjective Chief Complaint Information obtained from Patient Patients presents for treatment of an open diabetic ulcer. the patient has had swelling of both legs for about 3 months and recently developed a redness with pain and ulceration on the right lower extremity for about 3 weeks. History of Present Illness (HPI) The following HPI elements were documented for the patient's wound: Location: swelling both lower extremities and ulceration on the right lateral ankle Quality: Patient reports experiencing a dull pain to affected area(s). Severity: Patient states wound (s) are  getting better. Duration: Patient has had the wound for < 4 weeks prior to presenting for treatment Timing: Pain in wound is Intermittent (comes and goes Context: The wound appeared gradually over time Modifying Factors: Other treatment(s) tried include:she received amoxicillin and doxycycline and also has been putting some local bandage. Associated Signs and Symptoms: Patient reports having increase swelling. 74 year old patient who comes with a referral for bilateral lower extremity edema and a lower extremity ulceration and has been sent by her PCP Dr. Placido Sou. I understand the patient was recently put on amoxicillin and doxycycline but could not tolerate the amoxicillin. doxycycline course was completed. a BNP and EKG was supposed to be normal and the patient did not have any dyspnea. the patient has been on a diuretic. The  patient was also prescribed a pair of elastic compression stockings of the 20-30 mmHg pressure variety. x-ray of the right ankle was done on 09/20/2015 and showed posttraumatic and postsurgical changes of the right ankle with secondary degenerative changes of the tibiotalar joint and to a lesser degree the subtalar joint. No definite acute bony abnormalities are noted. Past medical history significant for diabetes mellitus, hypertension, hyperlipidemia, right breast cancer treated with a mastectomy in 2014. She has never smoked. 10/24/2015 -- she had delayed her vascular test because of her husband surgery but she is now ready to get him taken care of. He is also unable to use compression stockings and hence we will need to order her Juzo wraps. 10/31/2015-- was seen by Dr. Lucky Cowboy on 10/28/2015. She had a left lower extremity arterial duplex done at his office a couple of years ago and that was essentially normal. Today they performed a venous duplex which [place], [place]. ([id number]) revealed no evidence of deep vein thrombosis, superficial thrombophlebitis, no venous reflex was seen on the right and a minimal amount of reflux was seen on the left great saphenous vein but no significant reflux was seen. Impression was that there was a component of lymphedema present from a previous surgery and he would recommend compression stockings and leg elevation. Objective Constitutional Pulse regular. Respirations normal and unlabored. Afebrile. Vitals Time Taken: 4:00 PM, Height: 63 in, Weight: 146 lbs, BMI: 25.9, Temperature: 98.4 F, Pulse: 64 bpm, Respiratory Rate: 18 breaths/min, Blood Pressure: 123/59 mmHg. Eyes Nonicteric. Reactive to light. Ears, Nose, Mouth, and Throat Lips, teeth, and gums WNL.Marland Kitchen Moist mucosa without lesions. Neck supple and nontender. No palpable supraclavicular or cervical adenopathy. Normal sized without goiter. Respiratory WNL. No  retractions.. Cardiovascular Pedal Pulses WNL. No clubbing, cyanosis or edema. Lymphatic No adneopathy. No adenopathy. No adenopathy. Musculoskeletal Adexa without tenderness or enlargement.. Digits and nails w/o clubbing, cyanosis, infection, petechiae, ischemia, or inflammatory conditions.Marland Kitchen Psychiatric Judgement and insight Intact.. No evidence of depression, anxiety, or agitation.. General Notes: there was a eschar over the wound and after gently removing this the wound is completely healed and there is no open ulceration. Integumentary (Hair, Skin) No suspicious lesions. No crepitus or fluctuance. No peri-wound warmth or erythema. No masses.Marland Kitchen [name], [name]. ([id number]) Wound #1 status is Open. Original cause of wound was Gradually Appeared. The wound is located on the Right,Proximal,Lateral Malleolus. The wound measures 0.5cm length x 0.5cm width x 0.1cm depth; 0.196cm^2 area and 0.02cm^3 volume. The wound is limited to skin breakdown. There is no tunneling or undermining noted. There is a none present amount of drainage noted. The wound margin is distinct with the outline attached to the wound base. There is no granulation within  the wound bed. There is a large (67- 100%) amount of necrotic tissue within the wound bed including Eschar. The periwound skin appearance had no abnormalities noted for moisture. The periwound skin appearance did not exhibit: Callus, Crepitus, Excoriation, Fluctuance, Friable, Induration, Localized Edema, Rash, Scarring, Atrophie Blanche, Cyanosis, Ecchymosis, Hemosiderin Staining, Mottled, Pallor, Rubor, Erythema. Periwound temperature was noted as No Abnormality. Assessment Active Problems ICD-10 E11.622 - Type 2 diabetes mellitus with other skin ulcer I89.0 - Lymphedema, not elsewhere classified L97.312 - Non-pressure chronic ulcer of right ankle with fat layer exposed I have discussed a vascular reports with her and her husband was at the  bedside. We have discussed the need to continue wearing Juzo stockings daily and doing this everyday for life. All questions have been answered and they understand the treatment plan. His discharge from the wound care services and will be seen back as needed. Plan Edema Control: Elevate legs to the level of the heart and pump ankles as often as possible Other: - place juxtalite on pts right leg Discharge From [facility] Services: Discharge from [facility], [name]. ([id number]) I have discussed a vascular reports with her and her husband was at the bedside. We have discussed the need to continue wearing Juzo stockings daily and doing this everyday for life. All questions have been answered and they understand the treatment plan. His discharge from the wound care services and will be seen back as needed. Electronic Signature(s) Signed: 11/01/2015 3:59:29 PM By: Christin Fudge MD, FACS Entered By: Christin Fudge on 11/01/2015 15:59:29 [place], [name] ([id number]) -------------------------------------------------------------------------------- SuperBill Details Patient Name: [name] Nims. Date of Service: 10/31/2015 Medical Record Number: [id number] Patient Account Number: [id number] Date of Birth/Sex: 07-Mar-1941 (74 y.o. Female) Treating RN: Carolyne Fiscal, Debi Primary Care Physician: Delight Stare Other Clinician: Referring Physician: Delight Stare Treating Physician/Extender: [name] in Treatment: 2 Diagnosis Coding ICD-10 Codes Code Description E11.622 Type 2 diabetes mellitus with other skin ulcer I89.0 Lymphedema, not elsewhere classified L97.312 Non-pressure chronic ulcer of right ankle with fat layer exposed Facility Procedures CPT4 Code: [id number] Description: [phone] - WOUND CARE VISIT-LEV 2 EST PT Modifier: Quantity: 1 Physician Procedures CPT4 Code Description: [id number] - WC PHYS LEVEL 3 - EST PT ICD-10 Description Diagnosis E11.622 Type 2  diabetes mellitus with other skin ulcer L97.312 Non-pressure chronic ulcer of right ankle with fat I89.0 Lymphedema, not elsewhere classified Modifier: layer expose Quantity: 1 d Electronic Signature(s) Signed: 11/02/2015 9:49:23 AM By: Montey Hora Previous Signature: 11/01/2015 3:59:54 PM Version By: Christin Fudge MD, FACS Entered By: Montey Hora on 11/02/2015 09:49:23

## 2015-11-03 NOTE — Progress Notes (Signed)
MCKINNEY, GLASS ([id number]) Visit Report for 10/31/2015 Arrival Information Details Patient Name: [name], [name]. Date of Service: 10/31/2015 3:30 PM Medical Record Number: [id number] Patient Account Number: [id number] Date of Birth/Sex: 1941/01/05 (74 y.o. Female) Treating RN: Carolyne Fiscal, Debi Primary Care Physician: Delight Stare Other Clinician: Referring Physician: Delight Stare Treating Physician/Extender: Frann Rider in Treatment: 2 Visit Information History Since Last Visit All ordered tests and consults were completed: No Patient Arrived: [name] Added or deleted any medications: No Arrival Time: 15:58 Any new allergies or adverse reactions: No Accompanied By: son Had a fall or experienced change in No Transfer Assistance: None activities of daily living that may affect Patient Identification Verified: Yes risk of falls: Secondary Verification Process Yes Signs or symptoms of abuse/neglect since last No Completed: visito Patient Requires Transmission- No Hospitalized since last visit: No Based Precautions: Pain Present Now: No Patient Has Alerts: Yes Patient Alerts: Patient on Blood Thinner Aspirin Electronic Signature(s) Signed: 11/02/2015 4:19:08 PM By: Alric Quan Entered By: Alric Quan on 10/31/2015 16:00:25 [place], [name] ([id number]) -------------------------------------------------------------------------------- Clinic Level of Care Assessment Details Patient Name: [name]. Date of Service: 10/31/2015 3:30 PM Medical Record Number: [id number] Patient Account Number: [id number] Date of Birth/Sex: September 02, 1941 (74 y.o. Female) Treating RN: [name] Primary Care Physician: Delight Stare Other Clinician: Referring Physician: Delight Stare Treating Physician/Extender: Frann Rider in Treatment: 2 Clinic Level of Care Assessment Items TOOL 4 Quantity Score []  - Use when only an EandM is performed on FOLLOW-UP  visit 0 ASSESSMENTS - Nursing Assessment / Reassessment X - Reassessment of Co-morbidities (includes updates in patient status) 1 10 X - Reassessment of Adherence to Treatment Plan 1 5 ASSESSMENTS - Wound and Skin Assessment / Reassessment X - Simple Wound Assessment / Reassessment - one wound 1 5 []  - Complex Wound Assessment / Reassessment - multiple wounds 0 []  - Dermatologic / Skin Assessment (not related to wound area) 0 ASSESSMENTS - Focused Assessment []  - Circumferential Edema Measurements - multi extremities 0 []  - Nutritional Assessment / Counseling / Intervention 0 X - Lower Extremity Assessment (monofilament, tuning fork, pulses) 1 5 []  - Peripheral Arterial Disease Assessment (using hand held doppler) 0 ASSESSMENTS - Ostomy and/or Continence Assessment and Care []  - Incontinence Assessment and Management 0 []  - Ostomy Care Assessment and Management (repouching, etc.) 0 PROCESS - Coordination of Care X - Simple Patient / Family Education for ongoing care 1 15 []  - Complex (extensive) Patient / Family Education for ongoing care 0 []  - Staff obtains Programmer, systems, Records, Test Results / Process Orders 0 []  - Staff telephones HHA, Nursing Homes / Clarify orders / etc 0 []  - Routine Transfer to another Facility (non-emergent condition) 0 [name], Zearing. ([id number]) []  - Routine Hospital Admission (non-emergent condition) 0 []  - New Admissions / Biomedical engineer / Ordering NPWT, Apligraf, etc. 0 []  - Emergency Hospital Admission (emergent condition) 0 X - Simple Discharge Coordination 1 10 []  - Complex (extensive) Discharge Coordination 0 PROCESS - Special Needs []  - Pediatric / Minor Patient Management 0 []  - Isolation Patient Management 0 []  - Hearing / Language / Visual special needs 0 []  - Assessment of Community assistance (transportation, D/C planning, etc.) 0 []  - Additional assistance / Altered mentation 0 []  - Support Surface(s) Assessment (bed, cushion,  seat, etc.) 0 INTERVENTIONS - Wound Cleansing / Measurement X - Simple Wound Cleansing - one wound 1 5 []  - Complex Wound Cleansing - multiple wounds 0 X - Wound Imaging (photographs -  any number of wounds) 1 5 []  - Wound Tracing (instead of photographs) 0 X - Simple Wound Measurement - one wound 1 5 []  - Complex Wound Measurement - multiple wounds 0 INTERVENTIONS - Wound Dressings []  - Small Wound Dressing one or multiple wounds 0 []  - Medium Wound Dressing one or multiple wounds 0 []  - Large Wound Dressing one or multiple wounds 0 []  - Application of Medications - topical 0 []  - Application of Medications - injection 0 INTERVENTIONS - Miscellaneous []  - External ear exam 0 [name], [name]. ([id number]) []  - Specimen Collection (cultures, biopsies, blood, body fluids, etc.) 0 []  - Specimen(s) / Culture(s) sent or taken to Lab for analysis 0 []  - Patient Transfer (multiple staff / Harrel Lemon Lift / Similar devices) 0 []  - Simple Staple / Suture removal (25 or less) 0 []  - Complex Staple / Suture removal (26 or more) 0 []  - Hypo / Hyperglycemic Management (close monitor of Blood Glucose) 0 []  - Ankle / Brachial Index (ABI) - do not check if billed separately 0 X - Vital Signs 1 5 Has the patient been seen at the hospital within the last three years: Yes Total Score: 70 Level Of Care: New/Established - Level 2 Electronic Signature(s) Signed: 11/02/2015 9:49:14 AM By: [name] Entered By: [name] on 11/02/2015 09:49:14 [place], [name] ([id number]) -------------------------------------------------------------------------------- Encounter Discharge Information Details Patient Name: [name] Ramus L. Date of Service: 10/31/2015 3:30 PM Medical Record Number: [id number] Patient Account Number: [id number] Date of Birth/Sex: April 24, 1941 (74 y.o. Female) Treating RN: Carolyne Fiscal, Debi Primary Care Physician: Delight Stare Other Clinician: Referring Physician: Delight Stare Treating Physician/Extender: Frann Rider in Treatment: 2 Encounter Discharge Information Items Discharge Pain Level: 0 Discharge Condition: Stable Ambulatory Status: Cane Discharge Destination: Home Transportation: Private Auto Accompanied By: son Schedule Follow-up Appointment: No Medication Reconciliation completed and provided to Patient/Care Yes [place]: Provided on Clinical Summary of Care: 10/31/2015 Form Type Recipient Paper Patient DM Electronic Signature(s) Signed: 11/02/2015 4:19:08 PM By: Alric Quan Previous Signature: 10/31/2015 4:35:40 PM Version By: Ruthine Dose Entered By: Alric Quan on 10/31/2015 16:36:08 [place], [name] ([id number]) -------------------------------------------------------------------------------- Lower Extremity Assessment Details Patient Name: [name] Ramus L. Date of Service: 10/31/2015 3:30 PM Medical Record Number: [id number] Patient Account Number: [id number] Date of Birth/Sex: [birth year] (74 y.o. Female) Treating RN: Carolyne Fiscal, Debi Primary Care Physician: MILES, LINDA Other Clinician: Referring Physician: Delight Stare Treating Physician/Extender: Frann Rider in Treatment: 2 Edema Assessment Assessed: [Left: No] [Right: No] Edema: [Left: Ye] [Right: s] Calf Left: Right: Point of Measurement: cm From Medial Instep cm 32.5 cm Ankle Left: Right: Point of Measurement: cm From Medial Instep cm 21.5 cm Vascular Assessment Pulses: Posterior Tibial Dorsalis Pedis Palpable: [Right:Yes] Extremity colors, hair growth, and conditions: Extremity Color: [Right:Hyperpigmented] Hair Growth on Extremity: [Right:No] Temperature of Extremity: [Right:Warm] Capillary Refill: [Right:< 3 seconds] Toe Nail Assessment Left: Right: Thick: No Discolored: No Deformed: No Improper Length and Hygiene: No Electronic Signature(s) Signed: 11/02/2015 4:19:08 PM By: Alric Quan Entered By: Alric Quan on 10/31/2015 16:13:16 [place], [name] ([id number]) -------------------------------------------------------------------------------- Multi Wound Chart Details Patient Name: [name] Ramus L. Date of Service: 10/31/2015 3:30 PM Medical Record Number: [id number] Patient Account Number: [id number] Date of Birth/Sex: 27-Nov-1940 (74 y.o. Female) Treating RN: Carolyne Fiscal, Debi Primary Care Physician: Delight Stare Other Clinician: Referring Physician: Delight Stare Treating Physician/Extender: Frann Rider in Treatment: 2 Vital Signs Height(in): 63 Pulse(bpm): 64 Weight(lbs): 146 Blood Pressure 123/59 (mmHg): Body Mass Index(BMI): 26 Temperature(F): 98.4 Respiratory Rate  18 (breaths/min): Photos: [1:No Photos] [N/A:N/A] Wound Location: [1:Right Malleolus - Lateral, Proximal] [N/A:N/A] Wounding Event: [1:Gradually Appeared] [N/A:N/A] Primary Etiology: [1:Cellulitis] [N/A:N/A] Comorbid History: [1:Cataracts, Hypertension, Type II Diabetes, Osteoarthritis] [N/A:N/A] Date Acquired: [1:09/11/2015] [N/A:N/A] Weeks of Treatment: [1:2] [N/A:N/A] Wound Status: [1:Open] [N/A:N/A] Measurements L x W x D 0.5x0.5x0.1 [N/A:N/A] (cm) Area (cm) : [1:0.196] [N/A:N/A] Volume (cm) : [1:0.02] [N/A:N/A] % Reduction in Area: [1:37.60%] [N/A:N/A] % Reduction in Volume: 35.50% [N/A:N/A] Classification: [1:Partial Thickness] [N/A:N/A] HBO Classification: [1:Grade 1] [N/A:N/A] Exudate Amount: [1:Small] [N/A:N/A] Exudate Type: [1:Serous] [N/A:N/A] Exudate Color: [1:amber] [N/A:N/A] Wound Margin: [1:Distinct, outline attached] [N/A:N/A] Granulation Amount: [1:Medium (34-66%)] [N/A:N/A] Granulation Quality: [1:Pink, Pale] [N/A:N/A] Necrotic Amount: [1:Small (1-33%)] [N/A:N/A] Exposed Structures: [1:Fascia: No Fat: No Tendon: No Muscle: No] [N/A:N/A] Joint: No Bone: No Limited to Skin Breakdown Epithelialization: None N/A N/A Periwound Skin Texture: Edema: No N/A  N/A Excoriation: No Induration: No Callus: No Crepitus: No Fluctuance: No Friable: No Rash: No Scarring: No Periwound Skin Moist: Yes N/A N/A Moisture: Maceration: No Dry/Scaly: No Periwound Skin Color: Atrophie Blanche: No N/A N/A Cyanosis: No Ecchymosis: No Erythema: No Hemosiderin Staining: No Mottled: No Pallor: No Rubor: No Temperature: No Abnormality N/A N/A Tenderness on No N/A N/A Palpation: Wound Preparation: Ulcer Cleansing: Other: N/A N/A soap and water Topical Anesthetic Applied: Other: lidocaine 4% Treatment Notes Electronic Signature(s) Signed: 11/02/2015 4:19:08 PM By: Alric Quan Entered By: Alric Quan on 10/31/2015 16:15:20 [place], [name] ([id number]) -------------------------------------------------------------------------------- [place] Details Patient Name: [name]. Date of Service: 10/31/2015 3:30 PM Medical Record Number: [id number] Patient Account Number: [id number] Date of Birth/Sex: Feb 23, 1941 (74 y.o. Female) Treating RN: Carolyne Fiscal, Debi Primary Care Physician: Delight Stare Other Clinician: Referring Physician: Delight Stare Treating Physician/Extender: Frann Rider in Treatment: 2 Active Inactive Orientation to the Wound Care Program Nursing Diagnoses: Knowledge deficit related to the wound healing center program Goals: Patient/caregiver will verbalize understanding of the [place] Program Date Initiated: 10/11/2015 Goal Status: Active Interventions: Provide education on orientation to the wound center Notes: Wound/Skin Impairment Nursing Diagnoses: Impaired tissue integrity Knowledge deficit related to ulceration/compromised skin integrity Goals: Patient/caregiver will verbalize understanding of skin care regimen Date Initiated: 10/11/2015 Goal Status: Active Ulcer/skin breakdown will have a volume reduction of 30% by week 4 Date Initiated: 10/11/2015 Goal  Status: Active Ulcer/skin breakdown will have a volume reduction of 50% by week 8 Date Initiated: 10/11/2015 Goal Status: Active Ulcer/skin breakdown will have a volume reduction of 80% by week 12 Date Initiated: 10/11/2015 Goal Status: Active Ulcer/skin breakdown will heal within 14 weeks Date Initiated: 10/11/2015 [name], [name] ([id number]) Goal Status: Active Interventions: Assess patient/caregiver ability to obtain necessary supplies Assess patient/caregiver ability to perform ulcer/skin care regimen upon admission and as needed Assess ulceration(s) every visit Provide education on ulcer and skin care Treatment Activities: Referred to DME [facility] for dressing supplies : 10/31/2015 Skin care regimen initiated : 10/31/2015 Topical wound management initiated : 10/31/2015 Notes: Electronic Signature(s) Signed: 11/02/2015 4:19:08 PM By: Alric Quan Entered By: Alric Quan on 10/31/2015 16:34:34 [place], [name] ([id number]) -------------------------------------------------------------------------------- Pain Assessment Details Patient Name: [name] Ramus L. Date of Service: 10/31/2015 3:30 PM Medical Record Number: [id number] Patient Account Number: [id number] Date of Birth/Sex: 07/25/1941 (74 y.o. Female) Treating RN: Carolyne Fiscal, Debi Primary Care Physician: Delight Stare Other Clinician: Referring Physician: Delight Stare Treating Physician/Extender: Frann Rider in Treatment: 2 Active Problems Location of Pain Severity and Description of Pain Patient Has Paino No Site Locations Pain Management and Medication Current Pain  Management: Electronic Signature(s) Signed: 11/02/2015 4:19:08 PM By: Alric Quan Entered By: Alric Quan on 10/31/2015 16:00:32 [place], [name] ([id number]) -------------------------------------------------------------------------------- Patient/Caregiver Education Details Patient Name: [name] Date of Service: 10/31/2015 3:30 PM Medical Record Number: [id number] Patient Account Number: [id number] Date of Birth/Gender: November 09, 1941 (74 y.o. Female) Treating RN: Carolyne Fiscal, Debi Primary Care Physician: Delight Stare Other Clinician: Referring Physician: Delight Stare Treating Physician/Extender: Frann Rider in Treatment: 2 Education Assessment Education Provided To: Patient Education Topics Provided Wound/Skin Impairment: Handouts: Other: wear your juxtalites daily take them off at night Methods: Demonstration, Explain/Verbal Responses: State content correctly Electronic Signature(s) Signed: 11/02/2015 4:19:08 PM By: Alric Quan Entered By: Alric Quan on 10/31/2015 16:36:34 [place], [name] ([id number]) -------------------------------------------------------------------------------- Wound Assessment Details Patient Name: [name] Ramus L. Date of Service: 10/31/2015 3:30 PM Medical Record Number: [id number] Patient Account Number: [id number] Date of Birth/Sex: Sep 16, 1941 (74 y.o. Female) Treating RN: Carolyne Fiscal, Debi Primary Care Physician: MILES, LINDA Other Clinician: Referring Physician: Delight Stare Treating Physician/Extender: Frann Rider in Treatment: 2 Wound Status Wound Number: 1 Primary Cellulitis Etiology: Wound Location: Right Malleolus - Lateral, Proximal Wound Open Status: Wounding Event: Gradually Appeared Comorbid Cataracts, Hypertension, Type II Date Acquired: 09/11/2015 History: Diabetes, Osteoarthritis Weeks Of Treatment: 2 Clustered Wound: No Wound Measurements Length: (cm) 0.5 Width: (cm) 0.5 Depth: (cm) 0.1 Area: (cm) 0.196 Volume: (cm) 0.02 % Reduction in Area: 37.6% % Reduction in Volume: 35.5% Epithelialization: Small (1-33%) Tunneling: No Undermining: No Wound Description Classification: Partial Thickness Diabetic Severity [name]): Grade 1 Wound Margin: Distinct, outline attache Exudate  Amount: None Present Foul Odor After Cleansing: No d Wound Bed Granulation Amount: None Present (0%) Exposed Structure Necrotic Amount: Large (67-100%) Fascia Exposed: No Necrotic Quality: Eschar Fat Layer Exposed: No Tendon Exposed: No Muscle Exposed: No Joint Exposed: No Bone Exposed: No Limited to Skin Breakdown Periwound Skin Texture Texture Color No Abnormalities Noted: No No Abnormalities Noted: No Callus: No Atrophie Blanche: No Crepitus: No Cyanosis: No Excoriation: No Ecchymosis: No Fluctuance: No Erythema: No [name], [name]. ([id number]) Friable: No Hemosiderin Staining: No Induration: No Mottled: No Localized Edema: No Pallor: No Rash: No Rubor: No Scarring: No Temperature / Pain Moisture Temperature: No Abnormality No Abnormalities Noted: Yes Wound Preparation Ulcer Cleansing: Other: soap and water, Topical Anesthetic Applied: Other: lidocaine 4%, Electronic Signature(s) Signed: 11/02/2015 4:19:08 PM By: Alric Quan Entered By: Alric Quan on 11/01/2015 11:02:49 [place], [name] ([id number]) -------------------------------------------------------------------------------- Vitals Details Patient Name: [name]. Date of Service: 10/31/2015 3:30 PM Medical Record Number: [id number] Patient Account Number: [id number] Date of Birth/Sex: [birth year] (74 y.o. Female) Treating RN: Carolyne Fiscal, Debi Primary Care Physician: MILES, LINDA Other Clinician: Referring Physician: Delight Stare Treating Physician/Extender: Frann Rider in Treatment: 2 Vital Signs Time Taken: 16:00 Temperature (F): 98.4 Height (in): 63 Pulse (bpm): 64 Weight (lbs): 146 Respiratory Rate (breaths/min): 18 Body Mass Index (BMI): 25.9 Blood Pressure (mmHg): 123/59 Reference Range: 80 - 120 mg / dl Electronic Signature(s) Signed: 11/02/2015 4:19:08 PM By: Alric Quan Entered By: Alric Quan on 10/31/2015 16:02:53

## 2015-11-21 ENCOUNTER — Ambulatory Visit: Payer: Medicare HMO

## 2015-12-07 ENCOUNTER — Other Ambulatory Visit: Payer: Self-pay | Admitting: Oncology

## 2015-12-14 ENCOUNTER — Ambulatory Visit
Admission: RE | Admit: 2015-12-14 | Discharge: 2015-12-14 | Disposition: A | Discharge status: Home / Self Care | Payer: Medicare HMO | Source: Ambulatory Visit | Attending: Oncology | Admitting: Oncology

## 2015-12-14 ENCOUNTER — Other Ambulatory Visit: Payer: Self-pay | Admitting: Oncology

## 2015-12-14 DIAGNOSIS — Z1231 Encounter for screening mammogram for malignant neoplasm of breast: Secondary | ICD-10-CM | POA: Insufficient documentation

## 2015-12-14 DIAGNOSIS — Z9011 Acquired absence of right breast and nipple: Secondary | ICD-10-CM | POA: Diagnosis not present

## 2015-12-14 DIAGNOSIS — Z853 Personal history of malignant neoplasm of breast: Secondary | ICD-10-CM | POA: Diagnosis not present

## 2015-12-14 DIAGNOSIS — C50911 Malignant neoplasm of unspecified site of right female breast: Secondary | ICD-10-CM

## 2016-01-02 ENCOUNTER — Other Ambulatory Visit: Payer: Self-pay | Admitting: Oncology

## 2016-02-01 ENCOUNTER — Other Ambulatory Visit: Payer: Self-pay | Admitting: Oncology

## 2016-02-01 ENCOUNTER — Telehealth: Payer: Self-pay | Admitting: *Deleted

## 2016-02-01 DIAGNOSIS — C50911 Malignant neoplasm of unspecified site of right female breast: Secondary | ICD-10-CM

## 2016-02-01 MED ORDER — TAMOXIFEN CITRATE 20 MG PO TABS
20.0000 mg | ORAL_TABLET | Freq: Every day | ORAL | Status: DC
Start: 2016-02-01 — End: 2016-08-24

## 2016-02-01 NOTE — Telephone Encounter (Signed)
This patient is not an established patient at the cancer center. Dr. Oliva Bustard has not seen this patient and I cannot find any records regarding why she is taking tamoxifen or who started her on it. She will need to call the original prescriber for further refills of tamoxifen.

## 2016-02-01 NOTE — Telephone Encounter (Signed)
Called [name] to let her know this patient is not an established patient with the [facility].  She will contact the patient.

## 2016-02-01 NOTE — Telephone Encounter (Signed)
Requesting refill for Tamoxifen.

## 2016-02-01 NOTE — Telephone Encounter (Signed)
Refill sent to [facility]

## 2016-02-01 NOTE — Telephone Encounter (Signed)
Called patient and left message to inform her that prescription has been sent to [facility] for Tamoxifen.

## 2016-02-01 NOTE — Telephone Encounter (Signed)
Patient needs refill for Tamoxifen.

## 2016-02-29 DIAGNOSIS — M1712 Unilateral primary osteoarthritis, left knee: Secondary | ICD-10-CM | POA: Insufficient documentation

## 2016-04-12 ENCOUNTER — Inpatient Hospital Stay: Payer: Medicare HMO | Admitting: Family Medicine

## 2016-04-12 ENCOUNTER — Inpatient Hospital Stay: Payer: Medicare HMO

## 2016-04-26 ENCOUNTER — Inpatient Hospital Stay (HOSPITAL_BASED_OUTPATIENT_CLINIC_OR_DEPARTMENT_OTHER): Payer: Medicare HMO | Admitting: Family Medicine

## 2016-04-26 ENCOUNTER — Encounter: Payer: Self-pay | Admitting: Family Medicine

## 2016-04-26 ENCOUNTER — Inpatient Hospital Stay: Payer: Medicare HMO | Attending: Family Medicine

## 2016-04-26 VITALS — BP 144/83 | HR 78 | Temp 97.1°F | Ht 63.0 in | Wt 146.7 lb

## 2016-04-26 DIAGNOSIS — Z7982 Long term (current) use of aspirin: Secondary | ICD-10-CM | POA: Insufficient documentation

## 2016-04-26 DIAGNOSIS — E785 Hyperlipidemia, unspecified: Secondary | ICD-10-CM | POA: Insufficient documentation

## 2016-04-26 DIAGNOSIS — Z9223 Personal history of estrogen therapy: Secondary | ICD-10-CM | POA: Insufficient documentation

## 2016-04-26 DIAGNOSIS — Z9011 Acquired absence of right breast and nipple: Secondary | ICD-10-CM

## 2016-04-26 DIAGNOSIS — Z170 Estrogen receptor positive status [ER+]: Secondary | ICD-10-CM | POA: Diagnosis not present

## 2016-04-26 DIAGNOSIS — Z7984 Long term (current) use of oral hypoglycemic drugs: Secondary | ICD-10-CM | POA: Insufficient documentation

## 2016-04-26 DIAGNOSIS — Z853 Personal history of malignant neoplasm of breast: Secondary | ICD-10-CM | POA: Insufficient documentation

## 2016-04-26 DIAGNOSIS — Z79810 Long term (current) use of selective estrogen receptor modulators (SERMs): Secondary | ICD-10-CM

## 2016-04-26 DIAGNOSIS — C50911 Malignant neoplasm of unspecified site of right female breast: Secondary | ICD-10-CM | POA: Diagnosis not present

## 2016-04-26 DIAGNOSIS — I10 Essential (primary) hypertension: Secondary | ICD-10-CM | POA: Insufficient documentation

## 2016-04-26 DIAGNOSIS — M810 Age-related osteoporosis without current pathological fracture: Secondary | ICD-10-CM

## 2016-04-26 DIAGNOSIS — C50411 Malignant neoplasm of upper-outer quadrant of right female breast: Secondary | ICD-10-CM

## 2016-04-26 DIAGNOSIS — Z79899 Other long term (current) drug therapy: Secondary | ICD-10-CM | POA: Diagnosis not present

## 2016-04-26 DIAGNOSIS — E119 Type 2 diabetes mellitus without complications: Secondary | ICD-10-CM

## 2016-04-26 DIAGNOSIS — Z923 Personal history of irradiation: Secondary | ICD-10-CM | POA: Diagnosis not present

## 2016-04-26 DIAGNOSIS — M1990 Unspecified osteoarthritis, unspecified site: Secondary | ICD-10-CM | POA: Insufficient documentation

## 2016-04-26 LAB — CBC WITH DIFFERENTIAL/PLATELET
Basophils Absolute: 0.1 10*3/uL (ref 0–0.1)
Basophils Relative: 1 %
EOS PCT: 1 %
Eosinophils Absolute: 0.1 10*3/uL (ref 0–0.7)
HEMATOCRIT: 34.7 % — AB (ref 35.0–47.0)
Hemoglobin: 11.5 g/dL — ABNORMAL LOW (ref 12.0–16.0)
LYMPHS ABS: 2.6 10*3/uL (ref 1.0–3.6)
LYMPHS PCT: 29 %
MCH: 28.7 pg (ref 26.0–34.0)
MCHC: 33.1 g/dL (ref 32.0–36.0)
MCV: 86.6 fL (ref 80.0–100.0)
MONOS PCT: 6 %
Monocytes Absolute: 0.5 10*3/uL (ref 0.2–0.9)
Neutro Abs: 5.5 10*3/uL (ref 1.4–6.5)
Neutrophils Relative %: 63 %
PLATELETS: 223 10*3/uL (ref 150–440)
RBC: 4 MIL/uL (ref 3.80–5.20)
RDW: 15.9 % — ABNORMAL HIGH (ref 11.5–14.5)
WBC: 8.8 10*3/uL (ref 3.6–11.0)

## 2016-04-26 LAB — COMPREHENSIVE METABOLIC PANEL
ALT: 16 U/L (ref 14–54)
AST: 21 U/L (ref 15–41)
Albumin: 3.8 g/dL (ref 3.5–5.0)
Alkaline Phosphatase: 32 U/L — ABNORMAL LOW (ref 38–126)
Anion gap: 10 (ref 5–15)
BILIRUBIN TOTAL: 0.6 mg/dL (ref 0.3–1.2)
BUN: 26 mg/dL — AB (ref 6–20)
CALCIUM: 9.2 mg/dL (ref 8.9–10.3)
CHLORIDE: 105 mmol/L (ref 101–111)
CO2: 26 mmol/L (ref 22–32)
CREATININE: 1.06 mg/dL — AB (ref 0.44–1.00)
GFR, EST AFRICAN AMERICAN: 59 mL/min — AB (ref >60)
GFR, EST NON AFRICAN AMERICAN: 51 mL/min — AB (ref >60)
Glucose, Bld: 159 mg/dL — ABNORMAL HIGH (ref 65–99)
Potassium: 4.5 mmol/L (ref 3.5–5.1)
Sodium: 141 mmol/L (ref 135–145)
TOTAL PROTEIN: 6.9 g/dL (ref 6.5–8.1)

## 2016-04-26 NOTE — Progress Notes (Signed)
Patient here for follow up no health concerns today.

## 2016-04-26 NOTE — Progress Notes (Signed)
[name]  [phone]  [phone]     [name] DOB: 1941/06/22  MR#: [id number]  SPQ#:[id number]  Patient Care Team: [name] Merles, MD as PCP - General (Family Medicine)  CHIEF COMPLAINT:  Chief Complaint  Patient presents with  . Breast Cancer  . Follow-up    INTERVAL HISTORY: Patient is here for follow-up regarding carcinoma of the right breast. This was the patient's second diagnosis of breast cancer in the right breast. Her first diagnosis was in 1992 after which she completed a lumpectomy, axillary node dissection, which were negative, radiation therapy, and 5 years of tamoxifen. She was again diagnosed in January 2014 with a second primary right breast cancer and underwent a mastectomy. Patient reports overall feeling very well. She continues with chronic joint pain but no new complaints of pain or discomfort. She is currently on Fosamax weekly as well as 50,000 units of vitamin D monthly.  REVIEW OF SYSTEMS:   Review of Systems  Constitutional: Negative for fever, chills, weight loss, malaise/fatigue and diaphoresis.  HENT: Negative.   Eyes: Negative.   Respiratory: Negative for cough, hemoptysis, sputum production, shortness of breath and wheezing.   Cardiovascular: Positive for leg swelling. Negative for chest pain, palpitations, orthopnea, claudication and PND.  Gastrointestinal: Negative for heartburn, nausea, vomiting, abdominal pain, diarrhea, constipation, blood in stool and melena.  Genitourinary: Negative.   Musculoskeletal: Positive for joint pain.       Chronic in nature  Skin: Negative.   Neurological: Negative for dizziness, tingling, focal weakness, seizures and weakness.  Endo/Heme/Allergies: Does not bruise/bleed easily.  Psychiatric/Behavioral: Negative for depression. The patient is not nervous/anxious and does not have insomnia.     As per HPI. Otherwise, a complete review of systems is negatve.  ONCOLOGY  HISTORY: Oncology History   1. Abnormal mammogram dated January, 2014.  Her right breast approximately 1.2 x 0.7 cm palpable abnormality confirmed on ultrasound.  Biopsy was consistent with invasive mammary carcinoma.  Estrogen receptor, progesterone receptor, HER-2/neu receptor pending.. 2. In 1992 patient had carcinoma of right breast (Lesion  at  3 o'clock) had lumpectomy axillary node dissection  axillary  nodes are reported to be negative.  Patient had radiation therapy followed by 5 years of tamoxifen. 3. Right breast mastectomy(27th of March, 2014) pT1c pNx MO    Estrogen receptor IHC: Positive, 90% strong staining. Progesterone receptor: Negative, 0% staining. HER2-neu FISH: NOT amplified, HER2/CEP17 ratio 1.11. Oncotype  DX score was suggestive of 11% recurrent disease or pedal next 10 years with anti-hormone therapy. 3. Patient was started on letrozole but discontinued due to joint pain. Patient was started on tamoxifen in 2014.     Cancer of right breast ([place])   04/17/2015 Initial Diagnosis Cancer of right breast    PAST MEDICAL HISTORY: Past Medical History  Diagnosis Date  . Arthritis   . Hypertension   . Hyperlipidemia   . Diabetes mellitus without complication ([place])   . Cancer of right breast ([place]) 04/16/2013    right breast with mastectomy  . Breast cancer ([place]) 1992    right breast with lumpectomy and rad tx    PAST SURGICAL HISTORY: Past Surgical History  Procedure Laterality Date  . Mastectomy Right 2014    FAMILY HISTORY History reviewed. No pertinent family history.  GYNECOLOGIC HISTORY:  No LMP recorded.     ADVANCED DIRECTIVES:    HEALTH MAINTENANCE: Social History  Substance Use Topics  . Smoking status: Never Smoker   .  Smokeless tobacco: None  . Alcohol Use: None     Colonoscopy:  PAP:  Bone density:Followed by PCP  Mammogram:February 2017  Allergies  Allergen Reactions  . Other Anaphylaxis    Anesthisia has been a health issue for her in  the past.   . Sulfa Antibiotics Rash    Current Outpatient Prescriptions  Medication Sig Dispense Refill  . alendronate (FOSAMAX) 70 MG tablet Take by mouth.    Marland Kitchen aspirin 81 MG tablet Take 81 mg by mouth daily.    Marland Kitchen atenolol (TENORMIN) 50 MG tablet Take by mouth.    Marland Kitchen atorvastatin (LIPITOR) 40 MG tablet Take by mouth.    . furosemide (LASIX) 20 MG tablet Take by mouth.    . levothyroxine (SYNTHROID, LEVOTHROID) 75 MCG tablet     . lisinopril-hydrochlorothiazide (PRINZIDE,ZESTORETIC) 20-12.5 MG tablet Take by mouth.    . metFORMIN (GLUCOPHAGE) 1000 MG tablet     . tamoxifen (NOLVADEX) 20 MG tablet Take 1 tablet (20 mg total) by mouth daily. 30 tablet 6  . Vitamin D, Ergocalciferol, (DRISDOL) 50000 UNITS CAPS capsule Take by mouth.     No current facility-administered medications for this visit.    OBJECTIVE: BP 144/83 mmHg  Pulse 78  Temp(Src) 97.1 F (36.2 C) (Tympanic)  Ht _0  (1.6 m)  Wt 146 lb 11.2 oz (66.543 kg)  BMI 25.99 kg/m2   Body mass index is 25.99 kg/(m^2).    ECOG FS:1 - Symptomatic but completely ambulatory  General: Well-developed, well-nourished, no acute distress. Walking with assistance of cane. Eyes: Pink conjunctiva, anicteric sclera. HEENT: Normocephalic, moist mucous membranes, clear oropharnyx. Lungs: Clear to auscultation bilaterally. Heart: Regular rate and rhythm. No rubs, murmurs, or gallops. Abdomen: Soft, nontender, nondistended. No organomegaly noted, normoactive bowel sounds. Breast: Right mastectomy, chest wall and axilla free of masses. Left breast palpated in a circular manner in the sitting and supine positions.  No masses or fullness palpated.  Axilla palpated in both positions with no masses or fullness palpated.  Musculoskeletal: Chronic bilateral lower extremity 2+ edema, cyanosis, or clubbing. Neuro: Alert, answering all questions appropriately. Cranial nerves grossly intact. Skin: No rashes or petechiae noted. Psych: Normal  affect. Lymphatics: No cervical, clavicular, or axillary LAD.   LAB RESULTS:  Appointment on 04/26/2016  Component Date Value Ref Range Status  . WBC 04/26/2016 8.8  3.6 - 11.0 K/uL Final  . RBC 04/26/2016 4.00  3.80 - 5.20 MIL/uL Final  . Hemoglobin 04/26/2016 11.5* 12.0 - 16.0 g/dL Final  . HCT 04/26/2016 34.7* 35.0 - 47.0 % Final  . MCV 04/26/2016 86.6  80.0 - 100.0 fL Final  . MCH 04/26/2016 28.7  26.0 - 34.0 pg Final  . MCHC 04/26/2016 33.1  32.0 - 36.0 g/dL Final  . RDW 04/26/2016 15.9* 11.5 - 14.5 % Final  . Platelets 04/26/2016 223  150 - 440 K/uL Final  . Neutrophils Relative % 04/26/2016 63   Final  . Neutro Abs 04/26/2016 5.5  1.4 - 6.5 K/uL Final  . Lymphocytes Relative 04/26/2016 29   Final  . Lymphs Abs 04/26/2016 2.6  1.0 - 3.6 K/uL Final  . Monocytes Relative 04/26/2016 6   Final  . Monocytes Absolute 04/26/2016 0.5  0.2 - 0.9 K/uL Final  . Eosinophils Relative 04/26/2016 1   Final  . Eosinophils Absolute 04/26/2016 0.1  0 - 0.7 K/uL Final  . Basophils Relative 04/26/2016 1   Final  . Basophils Absolute 04/26/2016 0.1  0 - 0.1 K/uL Final  .  Sodium 04/26/2016 141  135 - 145 mmol/L Final  . Potassium 04/26/2016 4.5  3.5 - 5.1 mmol/L Final  . Chloride 04/26/2016 105  101 - 111 mmol/L Final  . CO2 04/26/2016 26  22 - 32 mmol/L Final  . Glucose, Bld 04/26/2016 159* 65 - 99 mg/dL Final  . BUN 04/26/2016 26* 6 - 20 mg/dL Final  . Creatinine, Ser 04/26/2016 1.06* 0.44 - 1.00 mg/dL Final  . Calcium 04/26/2016 9.2  8.9 - 10.3 mg/dL Final  . Total Protein 04/26/2016 6.9  6.5 - 8.1 g/dL Final  . Albumin 04/26/2016 3.8  3.5 - 5.0 g/dL Final  . AST 04/26/2016 21  15 - 41 U/L Final  . ALT 04/26/2016 16  14 - 54 U/L Final  . Alkaline Phosphatase 04/26/2016 32* 38 - 126 U/L Final  . Total Bilirubin 04/26/2016 0.6  0.3 - 1.2 mg/dL Final  . GFR calc non Af Amer 04/26/2016 51* >60 mL/min Final  . GFR calc Af Amer 04/26/2016 59* >60 mL/min Final   Comment: (NOTE) The eGFR has  been calculated using the CKD EPI equation. This calculation has not been validated in all clinical situations. eGFR's persistently <60 mL/min signify possible Chronic Kidney Disease.   . Anion gap 04/26/2016 10  5 - 15 Final    STUDIES: No results found.  ASSESSMENT:  Carcinoma of the right breast diagnosed in January 2014, T1 cNX M0, stage IA, ER/PR positive HER-2/neu negative. History of right breast cancer from 73.  PLAN:  1. Carcinoma of right breast, 2014. Patient is status post right breast mastectomy in March 2014. Clinically there is no evidence of recurrent disease. She is currently taking tamoxifen as she had worsening joint pain on letrozole and also has a known history of osteoporosis. Her most recent mammogram was in February 2017 and reported as BI-RADS 1, benign. Her DEXA scan is followed by her primary care provider. Patient's Oncotype DX score suggested an 11% chance of recurrent disease over a period of next 10 years with anti-hormone therapy. 2. History of right breast cancer from 1992. Patient completed treatment with lumpectomy, radiation therapy, and 5 years of tamoxifen.  Patient will continue with routine follow-up in approximately 6 months.  Patient expressed understanding and was in agreement with this plan. She also understands that She can call clinic at any time with any questions, concerns, or complaints.   Dr. Rogue Bussing was available for consultation and review of plan of care for this patient.  Cancer of right breast [facility])   Staging form: Breast, AJCC 7th Edition     Clinical: Stage IA (T1c, N0, M0) - Unsigned   Evlyn Kanner, NP   04/26/2016 11:30 AM

## 2016-07-27 ENCOUNTER — Other Ambulatory Visit: Payer: Self-pay | Admitting: Oncology

## 2016-08-24 ENCOUNTER — Other Ambulatory Visit: Payer: Self-pay | Admitting: Oncology

## 2016-08-24 ENCOUNTER — Other Ambulatory Visit: Payer: Self-pay | Admitting: *Deleted

## 2016-08-24 MED ORDER — TAMOXIFEN CITRATE 20 MG PO TABS: 20.0000 mg | ORAL_TABLET | Freq: Every day | ORAL | 0 refills | Status: DC

## 2016-09-25 ENCOUNTER — Other Ambulatory Visit: Payer: Self-pay | Admitting: Internal Medicine

## 2016-10-26 ENCOUNTER — Other Ambulatory Visit: Payer: Medicare HMO

## 2016-10-26 ENCOUNTER — Ambulatory Visit: Payer: Medicare HMO

## 2016-11-16 ENCOUNTER — Other Ambulatory Visit: Payer: Medicare HMO

## 2016-11-16 ENCOUNTER — Ambulatory Visit: Payer: Medicare HMO | Admitting: Oncology

## 2016-11-19 ENCOUNTER — Inpatient Hospital Stay: Payer: PPO | Attending: Oncology

## 2016-11-19 ENCOUNTER — Encounter: Payer: Self-pay | Admitting: Oncology

## 2016-11-19 ENCOUNTER — Inpatient Hospital Stay (HOSPITAL_BASED_OUTPATIENT_CLINIC_OR_DEPARTMENT_OTHER): Payer: PPO | Admitting: Oncology

## 2016-11-19 ENCOUNTER — Telehealth: Payer: Self-pay | Admitting: *Deleted

## 2016-11-19 VITALS — BP 120/74 | HR 75 | Temp 98.4°F | Resp 18 | Wt 138.9 lb

## 2016-11-19 DIAGNOSIS — E785 Hyperlipidemia, unspecified: Secondary | ICD-10-CM

## 2016-11-19 DIAGNOSIS — I10 Essential (primary) hypertension: Secondary | ICD-10-CM

## 2016-11-19 DIAGNOSIS — Z7984 Long term (current) use of oral hypoglycemic drugs: Secondary | ICD-10-CM | POA: Diagnosis not present

## 2016-11-19 DIAGNOSIS — Z853 Personal history of malignant neoplasm of breast: Secondary | ICD-10-CM | POA: Insufficient documentation

## 2016-11-19 DIAGNOSIS — M129 Arthropathy, unspecified: Secondary | ICD-10-CM | POA: Diagnosis not present

## 2016-11-19 DIAGNOSIS — Z9221 Personal history of antineoplastic chemotherapy: Secondary | ICD-10-CM | POA: Diagnosis not present

## 2016-11-19 DIAGNOSIS — Z79899 Other long term (current) drug therapy: Secondary | ICD-10-CM

## 2016-11-19 DIAGNOSIS — E119 Type 2 diabetes mellitus without complications: Secondary | ICD-10-CM | POA: Insufficient documentation

## 2016-11-19 DIAGNOSIS — Z9011 Acquired absence of right breast and nipple: Secondary | ICD-10-CM

## 2016-11-19 DIAGNOSIS — C50411 Malignant neoplasm of upper-outer quadrant of right female breast: Secondary | ICD-10-CM | POA: Diagnosis not present

## 2016-11-19 DIAGNOSIS — Z7982 Long term (current) use of aspirin: Secondary | ICD-10-CM

## 2016-11-19 DIAGNOSIS — Z79810 Long term (current) use of selective estrogen receptor modulators (SERMs): Secondary | ICD-10-CM | POA: Insufficient documentation

## 2016-11-19 DIAGNOSIS — M818 Other osteoporosis without current pathological fracture: Secondary | ICD-10-CM

## 2016-11-19 DIAGNOSIS — Z170 Estrogen receptor positive status [ER+]: Secondary | ICD-10-CM

## 2016-11-19 DIAGNOSIS — Z923 Personal history of irradiation: Secondary | ICD-10-CM | POA: Diagnosis not present

## 2016-11-19 DIAGNOSIS — M810 Age-related osteoporosis without current pathological fracture: Secondary | ICD-10-CM

## 2016-11-19 LAB — COMPREHENSIVE METABOLIC PANEL
ALBUMIN: 3.9 g/dL (ref 3.5–5.0)
ALK PHOS: 34 U/L — AB (ref 38–126)
ALT: 15 U/L (ref 14–54)
ANION GAP: 10 (ref 5–15)
AST: 21 U/L (ref 15–41)
BILIRUBIN TOTAL: 0.3 mg/dL (ref 0.3–1.2)
BUN: 33 mg/dL — AB (ref 6–20)
CALCIUM: 9.3 mg/dL (ref 8.9–10.3)
CO2: 25 mmol/L (ref 22–32)
Chloride: 100 mmol/L — ABNORMAL LOW (ref 101–111)
Creatinine, Ser: 1.32 mg/dL — ABNORMAL HIGH (ref 0.44–1.00)
GFR calc Af Amer: 45 mL/min — ABNORMAL LOW (ref >60)
GFR, EST NON AFRICAN AMERICAN: 39 mL/min — AB (ref >60)
GLUCOSE: 134 mg/dL — AB (ref 65–99)
Potassium: 3.7 mmol/L (ref 3.5–5.1)
Sodium: 135 mmol/L (ref 135–145)
TOTAL PROTEIN: 7.3 g/dL (ref 6.5–8.1)

## 2016-11-19 LAB — CBC WITH DIFFERENTIAL/PLATELET
BASOS PCT: 1 %
Basophils Absolute: 0.1 10*3/uL (ref 0–0.1)
Eosinophils Absolute: 0.1 10*3/uL (ref 0–0.7)
Eosinophils Relative: 1 %
HEMATOCRIT: 36.8 % (ref 35.0–47.0)
HEMOGLOBIN: 11.9 g/dL — AB (ref 12.0–16.0)
LYMPHS ABS: 2.9 10*3/uL (ref 1.0–3.6)
LYMPHS PCT: 31 %
MCH: 28.7 pg (ref 26.0–34.0)
MCHC: 32.2 g/dL (ref 32.0–36.0)
MCV: 89 fL (ref 80.0–100.0)
MONO ABS: 0.5 10*3/uL (ref 0.2–0.9)
MONOS PCT: 5 %
NEUTROS ABS: 5.8 10*3/uL (ref 1.4–6.5)
NEUTROS PCT: 62 %
Platelets: 267 10*3/uL (ref 150–440)
RBC: 4.14 MIL/uL (ref 3.80–5.20)
RDW: 15.5 % — ABNORMAL HIGH (ref 11.5–14.5)
WBC: 9.4 10*3/uL (ref 3.6–11.0)

## 2016-11-19 NOTE — Progress Notes (Signed)
Hematology/Oncology Consult note [facility]  Telephone:([phone] [phone]  Patient Care Team: Marguerita Merles, MD as PCP - General (Family Medicine)   Name of the patient: [name]  [id number]  [birth year]   Date of visit: 11/19/16  Diagnosis- right breast cancer stage I p T1cNx M0 diagnosed in January 2014 ER/PR positive and HER-2/neu negative  Chief complaint/ Reason for visit- routine follow-up of breast cancer  Heme/Onc history: Patient is a 76 year old female who was initially diagnosed with right breast cancer in 1992 status post lumpectomy and axillary lymph node dissection. She received adjuvant radiation therapy followed by 5 years of tamoxifen. In January 2014 she was diagnosed with second primary right breast cancer and underwent a mastectomy at that time. Oncotype score came back at low risk and patient did not require adjuvant chemotherapy. She could not tolerate AI and was started on tamoxifen for hormone therapy. She also has osteoporosis for which she takes vitamin D and Fosamax  Interval history- overall she is doing well. Chronic joint pain from arthritis but denies any new aches or pains. Reports that her appetite is normal and denies any unintentional weight loss. She has been compliant with her monthly vitamin D as well as Fosamax but has not been taking any calcium supplements. She is tolerating her tamoxifen well without any significant side effects.    Review of systems- Review of Systems  Constitutional: Negative for chills, fever, malaise/fatigue and weight loss.  HENT: Negative for congestion, ear discharge and nosebleeds.   Eyes: Negative for blurred vision.  Respiratory: Negative for cough, hemoptysis, sputum production, shortness of breath and wheezing.   Cardiovascular: Negative for chest pain, palpitations, orthopnea and claudication.  Gastrointestinal: Negative for abdominal pain, blood in stool,  constipation, diarrhea, heartburn, melena, nausea and vomiting.  Genitourinary: Negative for dysuria, flank pain, frequency, hematuria and urgency.  Musculoskeletal: Positive for joint pain. Negative for back pain and myalgias.  Skin: Negative for rash.  Neurological: Negative for dizziness, tingling, focal weakness, seizures, weakness and headaches.  Endo/Heme/Allergies: Does not bruise/bleed easily.  Psychiatric/Behavioral: Negative for depression and suicidal ideas. The patient does not have insomnia.    Breast exam is performed in seated and lying down position. Patient is status post right mastectomy without reconstruction. The implant edges are intact and there is no evidence of any chest wall recurrence. No evidence of bilateral axillary adenopathy. No palpable masses on the left side  Current treatment- tamoxifen  Allergies  Allergen Reactions  . Other Anaphylaxis    Anesthisia has been a health issue for her in the past.   . Sulfa Antibiotics Rash     Past Medical History:  Diagnosis Date  . Arthritis   . Breast cancer ([place]) 1992   right breast with lumpectomy and rad tx  . Cancer of right breast ([place]) 04/16/2013   right breast with mastectomy  . Diabetes mellitus without complication ([place])   . Hyperlipidemia   . Hypertension      Past Surgical History:  Procedure Laterality Date  . MASTECTOMY Right 2014    Social History   Social History  . Marital status: Married    Spouse name: N/A  . Number of children: N/A  . Years of education: N/A   Occupational History  . Not on file.   Social History Main Topics  . Smoking status: Never Smoker  . Smokeless tobacco: Not on file  . Alcohol use Not on file  . Drug use:  Unknown  . Sexual activity: Not on file   Other Topics Concern  . Not on file   Social History Narrative  . No narrative on file    No family history on file.   Current Outpatient Prescriptions:  .  alendronate (FOSAMAX) 70 MG tablet, Take  by mouth., Disp: , Rfl:  .  aspirin 81 MG tablet, Take 81 mg by mouth daily., Disp: , Rfl:  .  atenolol (TENORMIN) 50 MG tablet, Take by mouth., Disp: , Rfl:  .  atorvastatin (LIPITOR) 40 MG tablet, Take by mouth., Disp: , Rfl:  .  furosemide (LASIX) 20 MG tablet, Take by mouth., Disp: , Rfl:  .  levothyroxine (SYNTHROID, LEVOTHROID) 75 MCG tablet, , Disp: , Rfl:  .  lisinopril-hydrochlorothiazide (PRINZIDE,ZESTORETIC) 20-12.5 MG tablet, Take by mouth., Disp: , Rfl:  .  metFORMIN (GLUCOPHAGE) 1000 MG tablet, , Disp: , Rfl:  .  tamoxifen (NOLVADEX) 20 MG tablet, TAKE 1 TABLET BY MOUTH ONCE DAILY., Disp: 30 tablet, Rfl: 0 .  tamoxifen (NOLVADEX) 20 MG tablet, TAKE 1 TABLET BY MOUTH ONCE DAILY., Disp: 30 tablet, Rfl: 0 .  tamoxifen (NOLVADEX) 20 MG tablet, Take 1 tablet (20 mg total) by mouth daily., Disp: 30 tablet, Rfl: 0 .  Vitamin D, Ergocalciferol, (DRISDOL) 50000 UNITS CAPS capsule, Take by mouth., Disp: , Rfl:   Physical exam:  Vitals:   11/19/16 1354  BP: 120/74  Pulse: 75  Resp: 18  Temp: 98.4 F (36.9 C)  TempSrc: Tympanic  Weight: 138 lb 14.2 oz (63 kg)   Physical Exam  Constitutional: She is oriented to person, place, and time and well-developed, well-nourished, and in no distress.  She is ambulating with a cane  HENT:  Head: Normocephalic and atraumatic.  Eyes: EOM are normal. Pupils are equal, round, and reactive to light.  Neck: Normal range of motion.  Cardiovascular: Normal rate, regular rhythm and normal heart sounds.   Pulmonary/Chest: Effort normal and breath sounds normal.  Abdominal: Soft. Bowel sounds are normal.  Neurological: She is alert and oriented to person, place, and time.  Skin: Skin is warm and dry.     CMP Latest Ref Rng & Units 04/26/2016  Glucose 65 - 99 mg/dL 159(H)  BUN 6 - 20 mg/dL 26(H)  Creatinine 0.44 - 1.00 mg/dL 1.06(H)  Sodium 135 - 145 mmol/L 141  Potassium 3.5 - 5.1 mmol/L 4.5  Chloride 101 - 111 mmol/L 105  CO2 22 - 32 mmol/L 26   Calcium 8.9 - 10.3 mg/dL 9.2  Total Protein 6.5 - 8.1 g/dL 6.9  Total Bilirubin 0.3 - 1.2 mg/dL 0.6  Alkaline Phos 38 - 126 U/L 32(L)  AST 15 - 41 U/L 21  ALT 14 - 54 U/L 16   CBC Latest Ref Rng & Units 04/26/2016  WBC 3.6 - 11.0 K/uL 8.8  Hemoglobin 12.0 - 16.0 g/dL 11.5(L)  Hematocrit 35.0 - 47.0 % 34.7(L)  Platelets 150 - 440 K/uL 223    Mammogram from 12/14/2015 revealed no evidence of malignancy  Assessment and plan- Patient is a 76 y.o. female with a history of right breast cancer in 2014 status post mastectomy and currently on tamoxifen for hormone therapy  1. We were unable to find results of DEXA scan from her primary care doctor. Hence, we will go ahead and order one at this point. She is also due for a repeat mammogram next month. We will see her back in 6 months time for routine history and physical. Patient  will continue vitamin D and Fosamax for her osteoporosis. She will continue tamoxifen for hormone therapy for at least 5 years. I have suggested that she should start taking calcium 1200 mg daily. She does get eye exams on a yearly basis and she is status post hysterectomy for that as no increased risk of endometrial cancer in her case   Visit Diagnosis 1. Malignant neoplasm of upper-outer quadrant of right breast in female, estrogen receptor positive ([place])   2. Osteoporosis without current pathological fracture, unspecified osteoporosis type      Dr. Randa Evens, MD, MPH [facility] Pager- [phone] 11/19/2016 1:02 PM

## 2016-11-19 NOTE — Telephone Encounter (Signed)
I called [facility], [facility] [place] [facility] and Dr. Welton Flakes at [facility] looking for if pt has had a bone denisty ever.  All the above said no.  While pt was in clinic she kept saying maybe the surgeon Dr. Sabra Heck who worked on my lower leg/ankle did one.  She thought his name was Zandra Abts. I can't find a Zandra Abts in this area and she states that he was in [place]. I then called ortho Dr. Earnestine Leys to see if she had been there and she had not.  I gave this info to Dr. Janese Banks and she states to call pt and ask if she would be ok with her ordering bone density and I got her voicemail and asked her to call me back and left my numbers to reach me.

## 2016-11-19 NOTE — Progress Notes (Signed)
Patient is here for follow up, she mentions she has not had her mammogram. Dr. Tamala Julian orders is usually but patient received letter to have it done

## 2016-11-20 ENCOUNTER — Other Ambulatory Visit: Payer: Self-pay | Admitting: Surgery

## 2016-11-20 DIAGNOSIS — Z1231 Encounter for screening mammogram for malignant neoplasm of breast: Secondary | ICD-10-CM

## 2016-11-23 ENCOUNTER — Other Ambulatory Visit: Payer: Self-pay | Admitting: Internal Medicine

## 2016-12-19 ENCOUNTER — Ambulatory Visit: Payer: Medicare HMO

## 2016-12-19 ENCOUNTER — Ambulatory Visit
Admission: RE | Admit: 2016-12-19 | Discharge: 2016-12-19 | Disposition: A | Discharge status: Home / Self Care | Payer: PPO | Source: Ambulatory Visit | Attending: Surgery | Admitting: Surgery

## 2016-12-19 DIAGNOSIS — Z1231 Encounter for screening mammogram for malignant neoplasm of breast: Secondary | ICD-10-CM | POA: Insufficient documentation

## 2016-12-24 ENCOUNTER — Other Ambulatory Visit: Payer: Self-pay | Admitting: Oncology

## 2016-12-26 DIAGNOSIS — Z853 Personal history of malignant neoplasm of breast: Secondary | ICD-10-CM | POA: Diagnosis not present

## 2017-01-04 NOTE — Telephone Encounter (Signed)
Called the medication into the drug store for 90 day supply

## 2017-01-10 DIAGNOSIS — M19171 Post-traumatic osteoarthritis, right ankle and foot: Secondary | ICD-10-CM | POA: Diagnosis not present

## 2017-01-10 DIAGNOSIS — B351 Tinea unguium: Secondary | ICD-10-CM | POA: Diagnosis not present

## 2017-01-10 DIAGNOSIS — M79674 Pain in right toe(s): Secondary | ICD-10-CM | POA: Diagnosis not present

## 2017-01-10 DIAGNOSIS — M79675 Pain in left toe(s): Secondary | ICD-10-CM | POA: Diagnosis not present

## 2017-01-11 DIAGNOSIS — M1712 Unilateral primary osteoarthritis, left knee: Secondary | ICD-10-CM | POA: Diagnosis not present

## 2017-01-24 DIAGNOSIS — E039 Hypothyroidism, unspecified: Secondary | ICD-10-CM | POA: Insufficient documentation

## 2017-01-24 DIAGNOSIS — I10 Essential (primary) hypertension: Secondary | ICD-10-CM | POA: Insufficient documentation

## 2017-01-24 DIAGNOSIS — E119 Type 2 diabetes mellitus without complications: Secondary | ICD-10-CM | POA: Diagnosis not present

## 2017-01-24 DIAGNOSIS — Z853 Personal history of malignant neoplasm of breast: Secondary | ICD-10-CM | POA: Diagnosis not present

## 2017-01-24 DIAGNOSIS — Z7689 Persons encountering health services in other specified circumstances: Secondary | ICD-10-CM | POA: Diagnosis not present

## 2017-01-24 DIAGNOSIS — Z9011 Acquired absence of right breast and nipple: Secondary | ICD-10-CM | POA: Insufficient documentation

## 2017-01-24 DIAGNOSIS — M1712 Unilateral primary osteoarthritis, left knee: Secondary | ICD-10-CM | POA: Diagnosis not present

## 2017-01-24 DIAGNOSIS — Z23 Encounter for immunization: Secondary | ICD-10-CM | POA: Diagnosis not present

## 2017-04-03 DIAGNOSIS — R58 Hemorrhage, not elsewhere classified: Secondary | ICD-10-CM | POA: Diagnosis not present

## 2017-04-03 DIAGNOSIS — M79605 Pain in left leg: Secondary | ICD-10-CM | POA: Insufficient documentation

## 2017-04-03 DIAGNOSIS — M79604 Pain in right leg: Secondary | ICD-10-CM | POA: Diagnosis not present

## 2017-04-03 DIAGNOSIS — R600 Localized edema: Secondary | ICD-10-CM | POA: Insufficient documentation

## 2017-04-04 DIAGNOSIS — R600 Localized edema: Secondary | ICD-10-CM | POA: Diagnosis not present

## 2017-04-04 DIAGNOSIS — M79605 Pain in left leg: Secondary | ICD-10-CM | POA: Diagnosis not present

## 2017-04-04 DIAGNOSIS — R58 Hemorrhage, not elsewhere classified: Secondary | ICD-10-CM | POA: Diagnosis not present

## 2017-04-04 DIAGNOSIS — M79604 Pain in right leg: Secondary | ICD-10-CM | POA: Diagnosis not present

## 2017-04-16 DIAGNOSIS — E119 Type 2 diabetes mellitus without complications: Secondary | ICD-10-CM | POA: Diagnosis not present

## 2017-04-16 DIAGNOSIS — M1712 Unilateral primary osteoarthritis, left knee: Secondary | ICD-10-CM | POA: Diagnosis not present

## 2017-04-16 DIAGNOSIS — M810 Age-related osteoporosis without current pathological fracture: Secondary | ICD-10-CM | POA: Diagnosis not present

## 2017-04-16 DIAGNOSIS — R600 Localized edema: Secondary | ICD-10-CM | POA: Diagnosis not present

## 2017-04-16 DIAGNOSIS — R58 Hemorrhage, not elsewhere classified: Secondary | ICD-10-CM | POA: Diagnosis not present

## 2017-04-16 DIAGNOSIS — R0989 Other specified symptoms and signs involving the circulatory and respiratory systems: Secondary | ICD-10-CM | POA: Diagnosis not present

## 2017-04-18 ENCOUNTER — Other Ambulatory Visit (INDEPENDENT_AMBULATORY_CARE_PROVIDER_SITE_OTHER): Payer: Self-pay | Admitting: Unknown Physician Specialty

## 2017-04-18 ENCOUNTER — Ambulatory Visit (INDEPENDENT_AMBULATORY_CARE_PROVIDER_SITE_OTHER): Payer: PPO

## 2017-04-18 DIAGNOSIS — R600 Localized edema: Secondary | ICD-10-CM | POA: Diagnosis not present

## 2017-04-23 ENCOUNTER — Other Ambulatory Visit (INDEPENDENT_AMBULATORY_CARE_PROVIDER_SITE_OTHER): Payer: PPO

## 2017-04-23 ENCOUNTER — Other Ambulatory Visit (INDEPENDENT_AMBULATORY_CARE_PROVIDER_SITE_OTHER): Payer: Self-pay | Admitting: Unknown Physician Specialty

## 2017-04-23 DIAGNOSIS — R0989 Other specified symptoms and signs involving the circulatory and respiratory systems: Secondary | ICD-10-CM

## 2017-04-30 ENCOUNTER — Ambulatory Visit (INDEPENDENT_AMBULATORY_CARE_PROVIDER_SITE_OTHER): Payer: PPO | Admitting: Vascular Surgery

## 2017-04-30 ENCOUNTER — Encounter (INDEPENDENT_AMBULATORY_CARE_PROVIDER_SITE_OTHER): Payer: Self-pay | Admitting: Vascular Surgery

## 2017-04-30 VITALS — BP 128/74 | HR 81 | Resp 16 | Wt 152.0 lb

## 2017-04-30 DIAGNOSIS — E785 Hyperlipidemia, unspecified: Secondary | ICD-10-CM

## 2017-04-30 DIAGNOSIS — I890 Lymphedema, not elsewhere classified: Secondary | ICD-10-CM | POA: Diagnosis not present

## 2017-04-30 DIAGNOSIS — E118 Type 2 diabetes mellitus with unspecified complications: Secondary | ICD-10-CM

## 2017-04-30 DIAGNOSIS — I739 Peripheral vascular disease, unspecified: Secondary | ICD-10-CM

## 2017-04-30 NOTE — Progress Notes (Signed)
Subjective:    Patient ID: [name], female    DOB: 05-23-1941, 76 y.o.   MRN: [id number] Chief Complaint  Patient presents with  . Follow-up   Presents at the referral of Dr. Jefm Bryant for "absent pedal pulses" and "lower extremity edema". The patient endorses bilateral lower extremity swelling. This has been ongoing for many years. There is pain associated with the edema. Edema tends to worsen towards the end of the day. Patient does not engage in any type of conservative therapy including medical grade 1 compression stockings or elevating her level or higher. During a recent physical exam, the clinician was unable to palpate any pedal pulses. Patient denies any claudication-like symptoms rest pain or ulceration to her lower extremity. The patient's edema and subsequent pain has progressed to the point that it is interfering with her ability to function on a daily basis. The patient underwent a bilateral lower arterial ABI which is notable for no significant hemodynamic arterial sclerosis in the lower extremities. The patient underwent a bilateral lower extremity venous reflux exam which was notable for no deep or superficial evidence of DVT or reflux.   Review of Systems  Constitutional: Negative.   HENT: Negative.   Eyes: Negative.   Respiratory: Negative.   Cardiovascular: Positive for leg swelling.       Lower extremity pain  Gastrointestinal: Negative.   Endocrine: Negative.   Genitourinary: Negative.   Musculoskeletal: Negative.   Skin: Negative.   Allergic/Immunologic: Negative.   Neurological: Negative.   Hematological: Negative.   Psychiatric/Behavioral: Negative.       Objective:   Physical Exam  Constitutional: She is oriented to person, place, and time. She appears well-developed and well-nourished. No distress.  HENT:  Head: Normocephalic and atraumatic.  Eyes: Conjunctivae are normal. Pupils are equal, round, and reactive to light.  Neck: Normal range of  motion.  Cardiovascular: Normal rate, regular rhythm, normal heart sounds and intact distal pulses.   Pulses:      Radial pulses are 2+ on the right side, and 2+ on the left side.  Unable to palpate pedal pulses due to bilateral edema  Pulmonary/Chest: Effort normal.  Musculoskeletal: Normal range of motion. She exhibits edema (Moderate 2+ pitting edema).  Neurological: She is alert and oriented to person, place, and time.  Skin: Skin is warm and dry. She is not diaphoretic.  Psychiatric: She has a normal mood and affect. Her behavior is normal. Judgment and thought content normal.  Vitals reviewed.  BP 128/74   Pulse 81   Resp 16   Wt 152 lb (68.9 kg)   BMI 26.93 kg/m   Past Medical History:  Diagnosis Date  . Arthritis   . Breast cancer ([place]) 1992   right breast with lumpectomy and rad tx  . Cancer of right breast ([place]) 04/16/2013   right breast with mastectomy  . Diabetes mellitus without complication ([place])   . Hyperlipidemia   . Hypertension    Social History   Social History  . Marital status: Married    Spouse name: N/A  . Number of children: N/A  . Years of education: N/A   Occupational History  . Not on file.   Social History Main Topics  . Smoking status: Never Smoker  . Smokeless tobacco: Never Used  . Alcohol use No  . Drug use: No  . Sexual activity: Not on file   Other Topics Concern  . Not on file   Social History Narrative  .  No narrative on file   Past Surgical History:  Procedure Laterality Date  . MASTECTOMY Right 2014   History reviewed. No pertinent family history.  Allergies  Allergen Reactions  . Other Anaphylaxis    Anesthisia has been a health issue for her in the past.   . Sulfa Antibiotics Rash      Assessment & Plan:  Presents at the referral of Dr. Jefm Bryant for "absent pedal pulses" and "lower extremity edema". The patient endorses bilateral lower extremity swelling. This has been ongoing for many years. There is pain  associated with the edema. Edema tends to worsen towards the end of the day. Patient does not engage in any type of conservative therapy including medical grade 1 compression stockings or elevating her level or higher. During a recent physical exam, the clinician was unable to palpate any pedal pulses. Patient denies any claudication-like symptoms rest pain or ulceration to her lower extremity. The patient's edema and subsequent pain has progressed to the point that it is interfering with her ability to function on a daily basis. The patient underwent a bilateral lower arterial ABI which is notable for no significant hemodynamic arterial sclerosis in the lower extremities. The patient underwent a bilateral lower extremity venous reflux exam which was notable for no deep or superficial evidence of DVT or reflux.  1. PAD (peripheral artery disease) (HCC) - stable Patient with no hemodynamically significant atherosclerotic disease on ABI. Her discomfort is most likely due to her significant edema of the bilateral lower extremities. However we'll follow this on a yearly basis as the patient has multiple risks factors for peripheral artery disease  - VAS [place] ABI WITH/WO TBI; Future  2. Lymphedema - new Studies reviewed with the patient. I spent time discussing her duplex results, the difference between venous insufficiency vs lymphedema and why her swelling is not venous in origin. I discussed lymphedema and what symptoms it causes and how to manage them. The patient was encouraged to wear graduated compression stockings (20-30 mmHg) on a daily basis. The patient was instructed to begin wearing the stockings first thing in the morning and removing them in the evening. The patient was instructed specifically not to sleep in the stockings. Prescription given. In addition, behavioral modification including elevation during the day will be initiated. We discussed a lymphedema pump if conventional therapy goes  not work. The patient was advised to follow up in 1 months after wearing her compression stockings daily with elevation. Information on compression stockings, lymphedema and the lymphedema pump was given to the patient. The patient was instructed to call the office in the interim if any worsening edema or ulcerations to the legs, feet or toes occurs. The patient expresses their understanding.  3. Type 2 diabetes mellitus with complication, unspecified whether long term insulin use (HCC) - stable Encouraged good control as its slows the progression of atherosclerotic disease  4. Hyperlipidemia, unspecified hyperlipidemia type - stable Encouraged good control as its slows the progression of atherosclerotic disease   Current Outpatient Prescriptions on File Prior to Visit  Medication Sig Dispense Refill  . alendronate (FOSAMAX) 70 MG tablet Take by mouth.    Marland Kitchen atenolol (TENORMIN) 50 MG tablet Take 50 mg by mouth daily.     Marland Kitchen atorvastatin (LIPITOR) 40 MG tablet Take 40 mg by mouth daily.     . furosemide (LASIX) 20 MG tablet Take 20 mg by mouth daily.     Marland Kitchen levothyroxine (SYNTHROID, LEVOTHROID) 75 MCG tablet Take 75 mcg  by mouth daily.     Marland Kitchen lisinopril-hydrochlorothiazide (PRINZIDE,ZESTORETIC) 20-12.5 MG tablet Take 1 tablet by mouth daily.     . metFORMIN (GLUCOPHAGE) 1000 MG tablet Take 1,000 mg by mouth daily with breakfast.     . tamoxifen (NOLVADEX) 20 MG tablet TAKE 1 TABLET BY MOUTH ONCE DAILY. 90 tablet 2  . Vitamin D, Ergocalciferol, (DRISDOL) 50000 UNITS CAPS capsule Take by mouth.     No current facility-administered medications on file prior to visit.     There are no Patient Instructions on file for this visit. No Follow-up on file.   Tameah Mihalko A Wendolyn Raso, PA-C

## 2017-05-20 ENCOUNTER — Ambulatory Visit: Payer: Self-pay | Admitting: Oncology

## 2017-05-21 DIAGNOSIS — E119 Type 2 diabetes mellitus without complications: Secondary | ICD-10-CM | POA: Diagnosis not present

## 2017-05-21 DIAGNOSIS — D508 Other iron deficiency anemias: Secondary | ICD-10-CM | POA: Diagnosis not present

## 2017-05-21 DIAGNOSIS — Z7689 Persons encountering health services in other specified circumstances: Secondary | ICD-10-CM | POA: Diagnosis not present

## 2017-05-21 DIAGNOSIS — I10 Essential (primary) hypertension: Secondary | ICD-10-CM | POA: Diagnosis not present

## 2017-05-21 DIAGNOSIS — E039 Hypothyroidism, unspecified: Secondary | ICD-10-CM | POA: Diagnosis not present

## 2017-05-21 DIAGNOSIS — M1712 Unilateral primary osteoarthritis, left knee: Secondary | ICD-10-CM | POA: Diagnosis not present

## 2017-05-21 DIAGNOSIS — Z853 Personal history of malignant neoplasm of breast: Secondary | ICD-10-CM | POA: Diagnosis not present

## 2017-05-27 ENCOUNTER — Inpatient Hospital Stay: Payer: PPO | Attending: Oncology | Admitting: Oncology

## 2017-05-27 ENCOUNTER — Encounter: Payer: Self-pay | Admitting: Oncology

## 2017-05-27 VITALS — BP 126/78 | HR 66 | Temp 97.3°F | Resp 18 | Wt 152.3 lb

## 2017-05-27 DIAGNOSIS — Z9223 Personal history of estrogen therapy: Secondary | ICD-10-CM | POA: Diagnosis not present

## 2017-05-27 DIAGNOSIS — M810 Age-related osteoporosis without current pathological fracture: Secondary | ICD-10-CM | POA: Diagnosis not present

## 2017-05-27 DIAGNOSIS — Z7984 Long term (current) use of oral hypoglycemic drugs: Secondary | ICD-10-CM | POA: Insufficient documentation

## 2017-05-27 DIAGNOSIS — I10 Essential (primary) hypertension: Secondary | ICD-10-CM | POA: Diagnosis not present

## 2017-05-27 DIAGNOSIS — Z79899 Other long term (current) drug therapy: Secondary | ICD-10-CM | POA: Diagnosis not present

## 2017-05-27 DIAGNOSIS — E119 Type 2 diabetes mellitus without complications: Secondary | ICD-10-CM | POA: Diagnosis not present

## 2017-05-27 DIAGNOSIS — Z923 Personal history of irradiation: Secondary | ICD-10-CM | POA: Diagnosis not present

## 2017-05-27 DIAGNOSIS — C50411 Malignant neoplasm of upper-outer quadrant of right female breast: Secondary | ICD-10-CM | POA: Diagnosis not present

## 2017-05-27 DIAGNOSIS — E785 Hyperlipidemia, unspecified: Secondary | ICD-10-CM | POA: Insufficient documentation

## 2017-05-27 DIAGNOSIS — Z79810 Long term (current) use of selective estrogen receptor modulators (SERMs): Secondary | ICD-10-CM | POA: Insufficient documentation

## 2017-05-27 DIAGNOSIS — Z170 Estrogen receptor positive status [ER+]: Secondary | ICD-10-CM | POA: Diagnosis not present

## 2017-05-27 DIAGNOSIS — Z9011 Acquired absence of right breast and nipple: Secondary | ICD-10-CM | POA: Diagnosis not present

## 2017-05-27 DIAGNOSIS — Z853 Personal history of malignant neoplasm of breast: Secondary | ICD-10-CM | POA: Diagnosis not present

## 2017-05-27 NOTE — Progress Notes (Addendum)
Hematology/Oncology Consult note [facility]  [phone] [phone]  Patient Care Team: Tracie Harrier, MD as PCP - General (Internal Medicine)   Name of the patient: [name]  [id number]  1940-12-29   Date of visit: 05/27/17  right breast cancer stage I p T1cNx M0 diagnosed in January 2014 ER/PR positive and HER-2/neu negative  Chief complaint/ Reason for visit- routine follow-up of breast cancer  Heme/Onc history: Patient is a 76 year old female who was initially diagnosed with right breast cancer in 1992 status post lumpectomy and axillary lymph node dissection. She received adjuvant radiation therapy followed by 5 years of tamoxifen. In January 2014 she was diagnosed with second primary right breast cancer and underwent a mastectomy at that time. Oncotype score came back at low risk and patient did not require adjuvant chemotherapy. She could not tolerate AI and was started on tamoxifen for hormone therapy. She also has osteoporosis for which she takes vitamin D and Fosamax  Interval history- doing well. Denies any complaints  ECOG PS- 2 Pain scale- 0   Review of systems- Review of Systems  Constitutional: Negative for chills, fever, malaise/fatigue and weight loss.  HENT: Negative for congestion, ear discharge and nosebleeds.   Eyes: Negative for blurred vision.  Respiratory: Negative for cough, hemoptysis, sputum production, shortness of breath and wheezing.   Cardiovascular: Negative for chest pain, palpitations, orthopnea and claudication.  Gastrointestinal: Negative for abdominal pain, blood in stool, constipation, diarrhea, heartburn, melena, nausea and vomiting.  Genitourinary: Negative for dysuria, flank pain, frequency, hematuria and urgency.  Musculoskeletal: Negative for back pain, joint pain and myalgias.  Skin: Negative for rash.  Neurological: Negative for dizziness, tingling, focal weakness, seizures,  weakness and headaches.  Endo/Heme/Allergies: Does not bruise/bleed easily.  Psychiatric/Behavioral: Negative for depression and suicidal ideas. The patient does not have insomnia.       Allergies  Allergen Reactions  . Other Anaphylaxis    Anesthisia has been a health issue for her in the past.   . Sulfa Antibiotics Rash     Past Medical History:  Diagnosis Date  . Arthritis   . Breast cancer ([place]) 1992   right breast with lumpectomy and rad tx  . Cancer of right breast ([place]) 04/16/2013   right breast with mastectomy  . Diabetes mellitus without complication ([place])   . Hyperlipidemia   . Hypertension      Past Surgical History:  Procedure Laterality Date  . MASTECTOMY Right 2014    Social History   Social History  . Marital status: Married    Spouse name: N/A  . Number of children: N/A  . Years of education: N/A   Occupational History  . Not on file.   Social History Main Topics  . Smoking status: Never Smoker  . Smokeless tobacco: Never Used  . Alcohol use No  . Drug use: No  . Sexual activity: Not on file   Other Topics Concern  . Not on file   Social History Narrative  . No narrative on file    No family history on file.   Current Outpatient Prescriptions:  .  alendronate (FOSAMAX) 70 MG tablet, Take by mouth., Disp: , Rfl:  .  atenolol (TENORMIN) 50 MG tablet, Take 50 mg by mouth daily. , Disp: , Rfl:  .  atorvastatin (LIPITOR) 40 MG tablet, Take 40 mg by mouth daily. , Disp: , Rfl:  .  furosemide (LASIX) 20 MG tablet, Take 20 mg by mouth  daily. , Disp: , Rfl:  .  levothyroxine (SYNTHROID, LEVOTHROID) 75 MCG tablet, Take 75 mcg by mouth daily. , Disp: , Rfl:  .  lisinopril-hydrochlorothiazide (PRINZIDE,ZESTORETIC) 20-12.5 MG tablet, Take 1 tablet by mouth daily. , Disp: , Rfl:  .  metFORMIN (GLUCOPHAGE) 1000 MG tablet, Take 1,000 mg by mouth daily with breakfast. , Disp: , Rfl:  .  tamoxifen (NOLVADEX) 20 MG tablet, TAKE 1 TABLET BY MOUTH ONCE  DAILY., Disp: 90 tablet, Rfl: 2 .  Vitamin D, Ergocalciferol, (DRISDOL) 50000 UNITS CAPS capsule, Take by mouth., Disp: , Rfl:   Physical exam:  Vitals:   05/27/17 1403  BP: 126/78  Pulse: 66  Resp: 18  Temp: (!) 97.3 F (36.3 C)  TempSrc: Tympanic  Weight: 152 lb 5.4 oz (69.1 kg)   Physical Exam  Constitutional: She is oriented to person, place, and time and well-developed, well-nourished, and in no distress.  HENT:  Head: Normocephalic and atraumatic.  Eyes: Pupils are equal, round, and reactive to light. EOM are normal.  Neck: Normal range of motion.  Cardiovascular: Normal rate, regular rhythm and normal heart sounds.   Pulmonary/Chest: Effort normal and breath sounds normal.  Abdominal: Soft. Bowel sounds are normal.  Musculoskeletal:  Compression stockings in place  Neurological: She is alert and oriented to person, place, and time.  Skin: Skin is warm and dry.  Scattered bruising noted over b/l forearms and hands   Breast exam is performed in seated and lying down position. Patient is status post right mastectomy without reconstruction. The implant edges are intact and there is no evidence of any chest wall recurrence. No evidence of bilateral axillary adenopathy. No evidence of masses in left breast   CMP Latest Ref Rng & Units 11/19/2016  Glucose 65 - 99 mg/dL 134(H)  BUN 6 - 20 mg/dL 33(H)  Creatinine 0.44 - 1.00 mg/dL 1.32(H)  Sodium 135 - 145 mmol/L 135  Potassium 3.5 - 5.1 mmol/L 3.7  Chloride 101 - 111 mmol/L 100(L)  CO2 22 - 32 mmol/L 25  Calcium 8.9 - 10.3 mg/dL 9.3  Total Protein 6.5 - 8.1 g/dL 7.3  Total Bilirubin 0.3 - 1.2 mg/dL 0.3  Alkaline Phos 38 - 126 U/L 34(L)  AST 15 - 41 U/L 21  ALT 14 - 54 U/L 15   CBC Latest Ref Rng & Units 11/19/2016  WBC 3.6 - 11.0 K/uL 9.4  Hemoglobin 12.0 - 16.0 g/dL 11.9(L)  Hematocrit 35.0 - 47.0 % 36.8  Platelets 150 - 440 K/uL 267    Mammogram left breast in feb 2018 showed no evidence of malignancy   Assessment  and plan- Patient is a 76 y.o. female with a history of Stage I right breast cancer in 2014 status post mastectomy and currently on tamoxifen for hormone therapy  Continue tamoxifen. Given that this was a recurrent breast cancer, she would likely benefit from extended duration of tamoxifen. Recent mammogram in feb 2018 showed no evidence of malignancy. Repeat mammogram next year to be coordinated by Dr. Tamala Julian. Clinically she is doing well and there is no evidence of recurrence on todays exam. Recent cbc, cmp from 05/21/17 was stable as compared to priro values  H/o osteoporosis- declined dexa scan at this time. Continue calcium and fosamax  Bruising over b/l forearms- no prior h/o bleeding with surgeries. Bruising only present over b/l forearms likely due to fragile skin given her age. If it worsens, will consider further work up. Continue to monitor. PT/INR from May 2018 was  normal. LFT's normal  rtc in 6 months    Visit Diagnosis 1. Malignant neoplasm of upper-outer quadrant of right breast in female, estrogen receptor positive ([place])   2. Osteoporosis without current pathological fracture, unspecified osteoporosis type      Dr. Randa Evens, MD, MPH [facility] Pager- [phone] 05/27/2017 2:22 PM

## 2017-05-27 NOTE — Progress Notes (Signed)
Here for follow up

## 2017-05-28 DIAGNOSIS — I10 Essential (primary) hypertension: Secondary | ICD-10-CM | POA: Diagnosis not present

## 2017-05-28 DIAGNOSIS — M1990 Unspecified osteoarthritis, unspecified site: Secondary | ICD-10-CM | POA: Diagnosis not present

## 2017-05-28 DIAGNOSIS — Z853 Personal history of malignant neoplasm of breast: Secondary | ICD-10-CM | POA: Diagnosis not present

## 2017-05-28 DIAGNOSIS — D649 Anemia, unspecified: Secondary | ICD-10-CM | POA: Diagnosis not present

## 2017-05-28 DIAGNOSIS — R7989 Other specified abnormal findings of blood chemistry: Secondary | ICD-10-CM | POA: Diagnosis not present

## 2017-05-28 DIAGNOSIS — E039 Hypothyroidism, unspecified: Secondary | ICD-10-CM | POA: Diagnosis not present

## 2017-05-28 DIAGNOSIS — Z8781 Personal history of (healed) traumatic fracture: Secondary | ICD-10-CM | POA: Diagnosis not present

## 2017-05-28 DIAGNOSIS — Z780 Asymptomatic menopausal state: Secondary | ICD-10-CM | POA: Diagnosis not present

## 2017-05-28 DIAGNOSIS — E119 Type 2 diabetes mellitus without complications: Secondary | ICD-10-CM | POA: Diagnosis not present

## 2017-05-31 ENCOUNTER — Ambulatory Visit (INDEPENDENT_AMBULATORY_CARE_PROVIDER_SITE_OTHER): Payer: PPO | Admitting: Vascular Surgery

## 2017-06-05 DIAGNOSIS — Z780 Asymptomatic menopausal state: Secondary | ICD-10-CM | POA: Diagnosis not present

## 2017-06-05 DIAGNOSIS — M8588 Other specified disorders of bone density and structure, other site: Secondary | ICD-10-CM | POA: Diagnosis not present

## 2017-06-05 DIAGNOSIS — Z8781 Personal history of (healed) traumatic fracture: Secondary | ICD-10-CM | POA: Diagnosis not present

## 2017-07-31 DIAGNOSIS — M79675 Pain in left toe(s): Secondary | ICD-10-CM | POA: Diagnosis not present

## 2017-07-31 DIAGNOSIS — L97319 Non-pressure chronic ulcer of right ankle with unspecified severity: Secondary | ICD-10-CM | POA: Diagnosis not present

## 2017-07-31 DIAGNOSIS — B351 Tinea unguium: Secondary | ICD-10-CM | POA: Diagnosis not present

## 2017-07-31 DIAGNOSIS — I83013 Varicose veins of right lower extremity with ulcer of ankle: Secondary | ICD-10-CM | POA: Diagnosis not present

## 2017-07-31 DIAGNOSIS — M79674 Pain in right toe(s): Secondary | ICD-10-CM | POA: Diagnosis not present

## 2017-07-31 DIAGNOSIS — M19171 Post-traumatic osteoarthritis, right ankle and foot: Secondary | ICD-10-CM | POA: Diagnosis not present

## 2017-08-05 DIAGNOSIS — L97319 Non-pressure chronic ulcer of right ankle with unspecified severity: Secondary | ICD-10-CM | POA: Diagnosis not present

## 2017-08-05 DIAGNOSIS — I83013 Varicose veins of right lower extremity with ulcer of ankle: Secondary | ICD-10-CM | POA: Diagnosis not present

## 2017-08-12 DIAGNOSIS — L97319 Non-pressure chronic ulcer of right ankle with unspecified severity: Secondary | ICD-10-CM | POA: Diagnosis not present

## 2017-08-12 DIAGNOSIS — I83013 Varicose veins of right lower extremity with ulcer of ankle: Secondary | ICD-10-CM | POA: Diagnosis not present

## 2017-08-19 DIAGNOSIS — L97319 Non-pressure chronic ulcer of right ankle with unspecified severity: Secondary | ICD-10-CM | POA: Diagnosis not present

## 2017-08-19 DIAGNOSIS — I83013 Varicose veins of right lower extremity with ulcer of ankle: Secondary | ICD-10-CM | POA: Diagnosis not present

## 2017-08-26 DIAGNOSIS — I83013 Varicose veins of right lower extremity with ulcer of ankle: Secondary | ICD-10-CM | POA: Diagnosis not present

## 2017-08-26 DIAGNOSIS — L97311 Non-pressure chronic ulcer of right ankle limited to breakdown of skin: Secondary | ICD-10-CM | POA: Diagnosis not present

## 2017-08-26 DIAGNOSIS — L97319 Non-pressure chronic ulcer of right ankle with unspecified severity: Secondary | ICD-10-CM | POA: Diagnosis not present

## 2017-09-02 DIAGNOSIS — L97319 Non-pressure chronic ulcer of right ankle with unspecified severity: Secondary | ICD-10-CM | POA: Diagnosis not present

## 2017-09-02 DIAGNOSIS — L97311 Non-pressure chronic ulcer of right ankle limited to breakdown of skin: Secondary | ICD-10-CM | POA: Diagnosis not present

## 2017-09-02 DIAGNOSIS — I83013 Varicose veins of right lower extremity with ulcer of ankle: Secondary | ICD-10-CM | POA: Diagnosis not present

## 2017-09-09 DIAGNOSIS — L97319 Non-pressure chronic ulcer of right ankle with unspecified severity: Secondary | ICD-10-CM | POA: Diagnosis not present

## 2017-09-09 DIAGNOSIS — L97311 Non-pressure chronic ulcer of right ankle limited to breakdown of skin: Secondary | ICD-10-CM | POA: Diagnosis not present

## 2017-09-09 DIAGNOSIS — I83013 Varicose veins of right lower extremity with ulcer of ankle: Secondary | ICD-10-CM | POA: Diagnosis not present

## 2017-09-12 DIAGNOSIS — L97319 Non-pressure chronic ulcer of right ankle with unspecified severity: Secondary | ICD-10-CM | POA: Diagnosis not present

## 2017-09-12 DIAGNOSIS — L97311 Non-pressure chronic ulcer of right ankle limited to breakdown of skin: Secondary | ICD-10-CM | POA: Diagnosis not present

## 2017-09-12 DIAGNOSIS — I83013 Varicose veins of right lower extremity with ulcer of ankle: Secondary | ICD-10-CM | POA: Diagnosis not present

## 2017-09-18 DIAGNOSIS — L97319 Non-pressure chronic ulcer of right ankle with unspecified severity: Secondary | ICD-10-CM | POA: Diagnosis not present

## 2017-09-18 DIAGNOSIS — I83013 Varicose veins of right lower extremity with ulcer of ankle: Secondary | ICD-10-CM | POA: Diagnosis not present

## 2017-09-19 DIAGNOSIS — Z780 Asymptomatic menopausal state: Secondary | ICD-10-CM | POA: Diagnosis not present

## 2017-09-19 DIAGNOSIS — R7989 Other specified abnormal findings of blood chemistry: Secondary | ICD-10-CM | POA: Diagnosis not present

## 2017-09-19 DIAGNOSIS — E119 Type 2 diabetes mellitus without complications: Secondary | ICD-10-CM | POA: Diagnosis not present

## 2017-09-19 DIAGNOSIS — D649 Anemia, unspecified: Secondary | ICD-10-CM | POA: Diagnosis not present

## 2017-09-19 DIAGNOSIS — Z8781 Personal history of (healed) traumatic fracture: Secondary | ICD-10-CM | POA: Diagnosis not present

## 2017-09-19 DIAGNOSIS — E039 Hypothyroidism, unspecified: Secondary | ICD-10-CM | POA: Diagnosis not present

## 2017-09-19 DIAGNOSIS — Z853 Personal history of malignant neoplasm of breast: Secondary | ICD-10-CM | POA: Diagnosis not present

## 2017-09-19 DIAGNOSIS — M1990 Unspecified osteoarthritis, unspecified site: Secondary | ICD-10-CM | POA: Diagnosis not present

## 2017-09-19 DIAGNOSIS — I10 Essential (primary) hypertension: Secondary | ICD-10-CM | POA: Diagnosis not present

## 2017-09-25 DIAGNOSIS — L97319 Non-pressure chronic ulcer of right ankle with unspecified severity: Secondary | ICD-10-CM | POA: Diagnosis not present

## 2017-09-25 DIAGNOSIS — I83013 Varicose veins of right lower extremity with ulcer of ankle: Secondary | ICD-10-CM | POA: Diagnosis not present

## 2017-09-26 DIAGNOSIS — Z23 Encounter for immunization: Secondary | ICD-10-CM | POA: Diagnosis not present

## 2017-09-26 DIAGNOSIS — M159 Polyosteoarthritis, unspecified: Secondary | ICD-10-CM | POA: Diagnosis not present

## 2017-09-26 DIAGNOSIS — E119 Type 2 diabetes mellitus without complications: Secondary | ICD-10-CM | POA: Diagnosis not present

## 2017-09-26 DIAGNOSIS — D649 Anemia, unspecified: Secondary | ICD-10-CM | POA: Diagnosis not present

## 2017-09-26 DIAGNOSIS — M810 Age-related osteoporosis without current pathological fracture: Secondary | ICD-10-CM | POA: Diagnosis not present

## 2017-09-26 DIAGNOSIS — E785 Hyperlipidemia, unspecified: Secondary | ICD-10-CM | POA: Diagnosis not present

## 2017-09-26 DIAGNOSIS — E039 Hypothyroidism, unspecified: Secondary | ICD-10-CM | POA: Diagnosis not present

## 2017-09-26 DIAGNOSIS — Z0000 Encounter for general adult medical examination without abnormal findings: Secondary | ICD-10-CM | POA: Diagnosis not present

## 2017-09-26 DIAGNOSIS — I10 Essential (primary) hypertension: Secondary | ICD-10-CM | POA: Diagnosis not present

## 2017-10-14 DIAGNOSIS — M19171 Post-traumatic osteoarthritis, right ankle and foot: Secondary | ICD-10-CM | POA: Diagnosis not present

## 2017-11-19 ENCOUNTER — Other Ambulatory Visit: Payer: Self-pay | Admitting: Surgery

## 2017-11-19 DIAGNOSIS — Z1231 Encounter for screening mammogram for malignant neoplasm of breast: Secondary | ICD-10-CM

## 2017-11-25 ENCOUNTER — Ambulatory Visit: Payer: Self-pay | Admitting: Oncology

## 2017-12-02 ENCOUNTER — Inpatient Hospital Stay: Payer: PPO | Attending: Oncology | Admitting: Oncology

## 2017-12-02 ENCOUNTER — Encounter: Payer: Self-pay | Admitting: Oncology

## 2017-12-02 VITALS — BP 146/79 | HR 77 | Temp 97.3°F | Resp 20 | Wt 145.9 lb

## 2017-12-02 DIAGNOSIS — Z79810 Long term (current) use of selective estrogen receptor modulators (SERMs): Secondary | ICD-10-CM | POA: Diagnosis not present

## 2017-12-02 DIAGNOSIS — I10 Essential (primary) hypertension: Secondary | ICD-10-CM | POA: Insufficient documentation

## 2017-12-02 DIAGNOSIS — M810 Age-related osteoporosis without current pathological fracture: Secondary | ICD-10-CM | POA: Diagnosis not present

## 2017-12-02 DIAGNOSIS — Z79899 Other long term (current) drug therapy: Secondary | ICD-10-CM | POA: Insufficient documentation

## 2017-12-02 DIAGNOSIS — Z923 Personal history of irradiation: Secondary | ICD-10-CM | POA: Insufficient documentation

## 2017-12-02 DIAGNOSIS — C50411 Malignant neoplasm of upper-outer quadrant of right female breast: Secondary | ICD-10-CM | POA: Diagnosis not present

## 2017-12-02 DIAGNOSIS — E785 Hyperlipidemia, unspecified: Secondary | ICD-10-CM | POA: Insufficient documentation

## 2017-12-02 DIAGNOSIS — E119 Type 2 diabetes mellitus without complications: Secondary | ICD-10-CM | POA: Diagnosis not present

## 2017-12-02 DIAGNOSIS — Z9011 Acquired absence of right breast and nipple: Secondary | ICD-10-CM

## 2017-12-02 DIAGNOSIS — Z170 Estrogen receptor positive status [ER+]: Secondary | ICD-10-CM

## 2017-12-02 DIAGNOSIS — Z853 Personal history of malignant neoplasm of breast: Secondary | ICD-10-CM

## 2017-12-02 DIAGNOSIS — Z08 Encounter for follow-up examination after completed treatment for malignant neoplasm: Secondary | ICD-10-CM

## 2017-12-02 NOTE — Progress Notes (Signed)
Hematology/Oncology Consult note [facility]  [phone] [phone]  Patient Care Team: Tracie Harrier, MD as PCP - General (Internal Medicine)   Name of the patient: [name]  [id number]  Mar 12, 1941   Date of visit: 12/02/17  Diagnosis- rightbreast cancer stage I pT1cNxM0 diagnosed in January 2014 ER/PR positive and HER-2/neu negative  Chief complaint/ Reason for visit- routine follow-up of breast cancer  Heme/Onc history:Patient is a 77 year old female who was initially diagnosed with right breast cancer in 1992 status post lumpectomy and axillary lymph node dissection. She received adjuvant radiation therapy followed by 5 years of tamoxifen. In January 2014 she was diagnosed with second primary right breast cancer and underwent a mastectomy at that time. Oncotype score came back at low risk and patient did not require adjuvant chemotherapy. She could not tolerate AI and was started on tamoxifen for hormone therapy. She also has osteoporosis for which she takes vitamin D and Fosamax  Recent bone density scan from July 2018 revealed a T score of -1.8 in the left forearm, -1.5 with a left femoral neck.  10-year probability of a major hip fracture was 2.8% and that of a major osteoporotic fracture was 16%   Interval history-patient is tolerating her tamoxifen well and denies any complaints today.  Denies any symptoms of fatigue, unintentional weight loss or aches and pains anywhere  ECOG PS- 1 Pain scale- 0   Review of systems- Review of Systems  Constitutional: Negative for chills, fever, malaise/fatigue and weight loss.  HENT: Negative for congestion, ear discharge and nosebleeds.   Eyes: Negative for blurred vision.  Respiratory: Negative for cough, hemoptysis, sputum production, shortness of breath and wheezing.   Cardiovascular: Negative for chest pain, palpitations, orthopnea and claudication.  Gastrointestinal:  Negative for abdominal pain, blood in stool, constipation, diarrhea, heartburn, melena, nausea and vomiting.  Genitourinary: Negative for dysuria, flank pain, frequency, hematuria and urgency.  Musculoskeletal: Negative for back pain, joint pain and myalgias.  Skin: Negative for rash.  Neurological: Negative for dizziness, tingling, focal weakness, seizures, weakness and headaches.  Endo/Heme/Allergies: Does not bruise/bleed easily.  Psychiatric/Behavioral: Negative for depression and suicidal ideas. The patient does not have insomnia.       Allergies  Allergen Reactions  . Other Anaphylaxis    Anesthisia has been a health issue for her in the past.   . Sulfa Antibiotics Rash     Past Medical History:  Diagnosis Date  . Arthritis   . Breast cancer ([place]) 1992   right breast with lumpectomy and rad tx  . Cancer of right breast ([place]) 04/16/2013   right breast with mastectomy  . Diabetes mellitus without complication ([place])   . Hyperlipidemia   . Hypertension      Past Surgical History:  Procedure Laterality Date  . MASTECTOMY Right 2014    Social History   Socioeconomic History  . Marital status: Married    Spouse name: Not on file  . Number of children: Not on file  . Years of education: Not on file  . Highest education level: Not on file  Social Needs  . Financial resource strain: Not on file  . Food insecurity - worry: Not on file  . Food insecurity - inability: Not on file  . Transportation needs - medical: Not on file  . Transportation needs - non-medical: Not on file  Occupational History  . Not on file  Tobacco Use  . Smoking status: Never Smoker  . Smokeless  tobacco: Never Used  Substance and Sexual Activity  . Alcohol use: No    Alcohol/week: 0.0 oz  . Drug use: No  . Sexual activity: Not on file  Other Topics Concern  . Not on file  Social History Narrative  . Not on file    No family history on file.   Current Outpatient Medications:  .   alendronate (FOSAMAX) 70 MG tablet, Take by mouth., Disp: , Rfl:  .  atenolol (TENORMIN) 50 MG tablet, Take 50 mg by mouth daily. , Disp: , Rfl:  .  atorvastatin (LIPITOR) 40 MG tablet, Take 40 mg by mouth daily. , Disp: , Rfl:  .  furosemide (LASIX) 20 MG tablet, Take 20 mg by mouth daily. , Disp: , Rfl:  .  levothyroxine (SYNTHROID, LEVOTHROID) 75 MCG tablet, Take 75 mcg by mouth daily. , Disp: , Rfl:  .  lisinopril-hydrochlorothiazide (PRINZIDE,ZESTORETIC) 20-12.5 MG tablet, Take 1 tablet by mouth daily. , Disp: , Rfl:  .  metFORMIN (GLUCOPHAGE) 1000 MG tablet, Take 1,000 mg by mouth daily with breakfast. , Disp: , Rfl:  .  tamoxifen (NOLVADEX) 20 MG tablet, TAKE 1 TABLET BY MOUTH ONCE DAILY., Disp: 90 tablet, Rfl: 2 .  vitamin B-12 (CYANOCOBALAMIN) 1000 MCG tablet, Take by mouth., Disp: , Rfl:  .  Vitamin D, Ergocalciferol, (DRISDOL) 50000 UNITS CAPS capsule, Take by mouth., Disp: , Rfl:   Physical exam:  Vitals:   12/02/17 1301 12/02/17 1305  BP: (!) 153/75 (!) 146/79  Pulse: 77   Resp: 20   Temp: (!) 97.3 F (36.3 C)   TempSrc: Tympanic   Weight: 145 lb 15.1 oz (66.2 kg)    Physical Exam  Constitutional: She is oriented to person, place, and time and well-developed, well-nourished, and in no distress.  HENT:  Head: Normocephalic and atraumatic.  Eyes: EOM are normal. Pupils are equal, round, and reactive to light.  Neck: Normal range of motion.  Cardiovascular: Normal rate, regular rhythm and normal heart sounds.  Pulmonary/Chest: Effort normal and breath sounds normal.  Abdominal: Soft. Bowel sounds are normal.  Neurological: She is alert and oriented to person, place, and time.  Skin: Skin is warm and dry.  Breast: Exam performed in sitting and lying down position.  She is status post right mastectomy without reconstruction.  No evidence of chest wall recurrence on the right side.  No palpable masses in the left breast.  No palpable bilateral axillary adenopathy  CMP Latest  Ref Rng & Units 11/19/2016  Glucose 65 - 99 mg/dL 134(H)  BUN 6 - 20 mg/dL 33(H)  Creatinine 0.44 - 1.00 mg/dL 1.32(H)  Sodium 135 - 145 mmol/L 135  Potassium 3.5 - 5.1 mmol/L 3.7  Chloride 101 - 111 mmol/L 100(L)  CO2 22 - 32 mmol/L 25  Calcium 8.9 - 10.3 mg/dL 9.3  Total Protein 6.5 - 8.1 g/dL 7.3  Total Bilirubin 0.3 - 1.2 mg/dL 0.3  Alkaline Phos 38 - 126 U/L 34(L)  AST 15 - 41 U/L 21  ALT 14 - 54 U/L 15   CBC Latest Ref Rng & Units 11/19/2016  WBC 3.6 - 11.0 K/uL 9.4  Hemoglobin 12.0 - 16.0 g/dL 11.9(L)  Hematocrit 35.0 - 47.0 % 36.8  Platelets 150 - 440 K/uL 267     Assessment and plan- Patient is a 77 y.o. female with a history of Stage I right breast cancer in 2014 status post mastectomy and currently on tamoxifen for hormone therapy  Clinically patient is doing  well and there is no evidence of recurrence on today's exam.  I would recommend 10 years of tamoxifen at this time.  Mammogram from February 2018 revealed no evidence of malignancy.  She is scheduled to undergo left breast mammogram next month.    Osteoporosis: Continue Calcium and Fosamax.  She has declined bone density testing  I will see her back in 6 months time   Visit Diagnosis 1. Malignant neoplasm of upper-outer quadrant of right breast in female, estrogen receptor positive ([place])   2. Osteoporosis without current pathological fracture, unspecified osteoporosis type   3. Encounter for follow-up surveillance of breast cancer      Dr. Randa Evens, MD, MPH [facility] Pager- [phone] 12/02/2017 2:14 PM

## 2017-12-17 ENCOUNTER — Inpatient Hospital Stay: Admission: RE | Admit: 2017-12-17 | Payer: Self-pay | Source: Ambulatory Visit

## 2017-12-23 ENCOUNTER — Encounter: Payer: Self-pay | Admitting: Radiology

## 2017-12-23 ENCOUNTER — Ambulatory Visit
Admission: RE | Admit: 2017-12-23 | Discharge: 2017-12-23 | Disposition: A | Discharge status: Home / Self Care | Payer: PPO | Source: Ambulatory Visit | Attending: Surgery | Admitting: Surgery

## 2017-12-23 DIAGNOSIS — Z1231 Encounter for screening mammogram for malignant neoplasm of breast: Secondary | ICD-10-CM | POA: Insufficient documentation

## 2017-12-23 HISTORY — DX: Personal history of irradiation: Z92.3

## 2017-12-30 DIAGNOSIS — Z853 Personal history of malignant neoplasm of breast: Secondary | ICD-10-CM | POA: Diagnosis not present

## 2018-01-16 DIAGNOSIS — E039 Hypothyroidism, unspecified: Secondary | ICD-10-CM | POA: Diagnosis not present

## 2018-01-16 DIAGNOSIS — M810 Age-related osteoporosis without current pathological fracture: Secondary | ICD-10-CM | POA: Diagnosis not present

## 2018-01-16 DIAGNOSIS — E119 Type 2 diabetes mellitus without complications: Secondary | ICD-10-CM | POA: Diagnosis not present

## 2018-01-16 DIAGNOSIS — E785 Hyperlipidemia, unspecified: Secondary | ICD-10-CM | POA: Diagnosis not present

## 2018-01-16 DIAGNOSIS — D649 Anemia, unspecified: Secondary | ICD-10-CM | POA: Diagnosis not present

## 2018-01-16 DIAGNOSIS — I10 Essential (primary) hypertension: Secondary | ICD-10-CM | POA: Diagnosis not present

## 2018-01-16 DIAGNOSIS — M159 Polyosteoarthritis, unspecified: Secondary | ICD-10-CM | POA: Diagnosis not present

## 2018-01-23 DIAGNOSIS — M1712 Unilateral primary osteoarthritis, left knee: Secondary | ICD-10-CM | POA: Diagnosis not present

## 2018-01-23 DIAGNOSIS — Z853 Personal history of malignant neoplasm of breast: Secondary | ICD-10-CM | POA: Diagnosis not present

## 2018-01-23 DIAGNOSIS — E039 Hypothyroidism, unspecified: Secondary | ICD-10-CM | POA: Diagnosis not present

## 2018-01-23 DIAGNOSIS — I10 Essential (primary) hypertension: Secondary | ICD-10-CM | POA: Diagnosis not present

## 2018-02-07 DIAGNOSIS — M659 Synovitis and tenosynovitis, unspecified: Secondary | ICD-10-CM | POA: Diagnosis not present

## 2018-02-07 DIAGNOSIS — M79644 Pain in right finger(s): Secondary | ICD-10-CM | POA: Diagnosis not present

## 2018-02-07 DIAGNOSIS — M7989 Other specified soft tissue disorders: Secondary | ICD-10-CM | POA: Diagnosis not present

## 2018-02-12 DIAGNOSIS — M25441 Effusion, right hand: Secondary | ICD-10-CM | POA: Diagnosis not present

## 2018-02-12 DIAGNOSIS — M7989 Other specified soft tissue disorders: Secondary | ICD-10-CM | POA: Insufficient documentation

## 2018-02-12 DIAGNOSIS — E790 Hyperuricemia without signs of inflammatory arthritis and tophaceous disease: Secondary | ICD-10-CM | POA: Diagnosis not present

## 2018-02-12 DIAGNOSIS — M159 Polyosteoarthritis, unspecified: Secondary | ICD-10-CM | POA: Insufficient documentation

## 2018-02-12 DIAGNOSIS — M1A9XX1 Chronic gout, unspecified, with tophus (tophi): Secondary | ICD-10-CM | POA: Diagnosis not present

## 2018-02-25 ENCOUNTER — Other Ambulatory Visit: Payer: Self-pay | Admitting: Oncology

## 2018-02-25 DIAGNOSIS — M7989 Other specified soft tissue disorders: Secondary | ICD-10-CM | POA: Diagnosis not present

## 2018-02-25 DIAGNOSIS — E790 Hyperuricemia without signs of inflammatory arthritis and tophaceous disease: Secondary | ICD-10-CM | POA: Diagnosis not present

## 2018-03-25 DIAGNOSIS — M19171 Post-traumatic osteoarthritis, right ankle and foot: Secondary | ICD-10-CM | POA: Diagnosis not present

## 2018-03-25 DIAGNOSIS — L03115 Cellulitis of right lower limb: Secondary | ICD-10-CM | POA: Diagnosis not present

## 2018-03-25 DIAGNOSIS — L03116 Cellulitis of left lower limb: Secondary | ICD-10-CM | POA: Diagnosis not present

## 2018-03-25 DIAGNOSIS — M79675 Pain in left toe(s): Secondary | ICD-10-CM | POA: Diagnosis not present

## 2018-03-25 DIAGNOSIS — B351 Tinea unguium: Secondary | ICD-10-CM | POA: Diagnosis not present

## 2018-03-25 DIAGNOSIS — M79674 Pain in right toe(s): Secondary | ICD-10-CM | POA: Diagnosis not present

## 2018-04-25 DIAGNOSIS — M1712 Unilateral primary osteoarthritis, left knee: Secondary | ICD-10-CM | POA: Diagnosis not present

## 2018-04-25 DIAGNOSIS — E119 Type 2 diabetes mellitus without complications: Secondary | ICD-10-CM | POA: Diagnosis not present

## 2018-04-29 DIAGNOSIS — R58 Hemorrhage, not elsewhere classified: Secondary | ICD-10-CM | POA: Diagnosis not present

## 2018-04-29 DIAGNOSIS — M159 Polyosteoarthritis, unspecified: Secondary | ICD-10-CM | POA: Diagnosis not present

## 2018-04-29 DIAGNOSIS — M1712 Unilateral primary osteoarthritis, left knee: Secondary | ICD-10-CM | POA: Diagnosis not present

## 2018-04-29 DIAGNOSIS — E790 Hyperuricemia without signs of inflammatory arthritis and tophaceous disease: Secondary | ICD-10-CM | POA: Diagnosis not present

## 2018-04-29 DIAGNOSIS — M1A00X0 Idiopathic chronic gout, unspecified site, without tophus (tophi): Secondary | ICD-10-CM | POA: Insufficient documentation

## 2018-05-09 ENCOUNTER — Ambulatory Visit (INDEPENDENT_AMBULATORY_CARE_PROVIDER_SITE_OTHER): Payer: Self-pay | Admitting: Vascular Surgery

## 2018-05-09 ENCOUNTER — Encounter (INDEPENDENT_AMBULATORY_CARE_PROVIDER_SITE_OTHER): Payer: Self-pay

## 2018-05-20 DIAGNOSIS — R600 Localized edema: Secondary | ICD-10-CM | POA: Diagnosis not present

## 2018-05-20 DIAGNOSIS — M1A9XX1 Chronic gout, unspecified, with tophus (tophi): Secondary | ICD-10-CM | POA: Diagnosis not present

## 2018-05-20 DIAGNOSIS — I10 Essential (primary) hypertension: Secondary | ICD-10-CM | POA: Diagnosis not present

## 2018-05-23 DIAGNOSIS — Z853 Personal history of malignant neoplasm of breast: Secondary | ICD-10-CM | POA: Diagnosis not present

## 2018-05-23 DIAGNOSIS — M1712 Unilateral primary osteoarthritis, left knee: Secondary | ICD-10-CM | POA: Diagnosis not present

## 2018-05-23 DIAGNOSIS — E119 Type 2 diabetes mellitus without complications: Secondary | ICD-10-CM | POA: Diagnosis not present

## 2018-05-23 DIAGNOSIS — G8929 Other chronic pain: Secondary | ICD-10-CM | POA: Diagnosis not present

## 2018-05-23 DIAGNOSIS — M25562 Pain in left knee: Secondary | ICD-10-CM | POA: Diagnosis not present

## 2018-05-23 DIAGNOSIS — M25561 Pain in right knee: Secondary | ICD-10-CM | POA: Diagnosis not present

## 2018-05-23 DIAGNOSIS — R600 Localized edema: Secondary | ICD-10-CM | POA: Diagnosis not present

## 2018-05-23 DIAGNOSIS — M1990 Unspecified osteoarthritis, unspecified site: Secondary | ICD-10-CM | POA: Diagnosis not present

## 2018-05-23 DIAGNOSIS — Z1211 Encounter for screening for malignant neoplasm of colon: Secondary | ICD-10-CM | POA: Diagnosis not present

## 2018-05-23 DIAGNOSIS — D649 Anemia, unspecified: Secondary | ICD-10-CM | POA: Diagnosis not present

## 2018-05-23 DIAGNOSIS — E039 Hypothyroidism, unspecified: Secondary | ICD-10-CM | POA: Diagnosis not present

## 2018-05-23 DIAGNOSIS — I10 Essential (primary) hypertension: Secondary | ICD-10-CM | POA: Diagnosis not present

## 2018-05-26 DIAGNOSIS — R600 Localized edema: Secondary | ICD-10-CM | POA: Diagnosis not present

## 2018-05-26 DIAGNOSIS — G8929 Other chronic pain: Secondary | ICD-10-CM | POA: Diagnosis not present

## 2018-05-26 DIAGNOSIS — M25562 Pain in left knee: Secondary | ICD-10-CM | POA: Diagnosis not present

## 2018-05-26 DIAGNOSIS — M25561 Pain in right knee: Secondary | ICD-10-CM | POA: Diagnosis not present

## 2018-05-26 DIAGNOSIS — I10 Essential (primary) hypertension: Secondary | ICD-10-CM | POA: Diagnosis not present

## 2018-05-26 DIAGNOSIS — M1990 Unspecified osteoarthritis, unspecified site: Secondary | ICD-10-CM | POA: Diagnosis not present

## 2018-05-26 DIAGNOSIS — Z1211 Encounter for screening for malignant neoplasm of colon: Secondary | ICD-10-CM | POA: Diagnosis not present

## 2018-05-26 DIAGNOSIS — E119 Type 2 diabetes mellitus without complications: Secondary | ICD-10-CM | POA: Diagnosis not present

## 2018-05-26 DIAGNOSIS — D649 Anemia, unspecified: Secondary | ICD-10-CM | POA: Diagnosis not present

## 2018-05-26 DIAGNOSIS — Z853 Personal history of malignant neoplasm of breast: Secondary | ICD-10-CM | POA: Diagnosis not present

## 2018-05-26 DIAGNOSIS — M1712 Unilateral primary osteoarthritis, left knee: Secondary | ICD-10-CM | POA: Diagnosis not present

## 2018-05-26 DIAGNOSIS — E039 Hypothyroidism, unspecified: Secondary | ICD-10-CM | POA: Diagnosis not present

## 2018-05-27 ENCOUNTER — Other Ambulatory Visit: Payer: Self-pay

## 2018-05-27 ENCOUNTER — Other Ambulatory Visit: Payer: Self-pay | Admitting: Oncology

## 2018-05-27 DIAGNOSIS — Z853 Personal history of malignant neoplasm of breast: Secondary | ICD-10-CM

## 2018-05-27 MED ORDER — TAMOXIFEN CITRATE 20 MG PO TABS: 20.0000 mg | ORAL_TABLET | Freq: Every day | ORAL | 0 refills | Status: DC

## 2018-05-28 DIAGNOSIS — I10 Essential (primary) hypertension: Secondary | ICD-10-CM | POA: Diagnosis not present

## 2018-05-28 DIAGNOSIS — Z0000 Encounter for general adult medical examination without abnormal findings: Secondary | ICD-10-CM | POA: Diagnosis not present

## 2018-05-28 DIAGNOSIS — D649 Anemia, unspecified: Secondary | ICD-10-CM | POA: Diagnosis not present

## 2018-05-28 DIAGNOSIS — E039 Hypothyroidism, unspecified: Secondary | ICD-10-CM | POA: Diagnosis not present

## 2018-05-28 DIAGNOSIS — R600 Localized edema: Secondary | ICD-10-CM | POA: Diagnosis not present

## 2018-05-28 DIAGNOSIS — E119 Type 2 diabetes mellitus without complications: Secondary | ICD-10-CM | POA: Diagnosis not present

## 2018-05-29 ENCOUNTER — Other Ambulatory Visit: Payer: Self-pay | Admitting: Internal Medicine

## 2018-05-30 ENCOUNTER — Other Ambulatory Visit: Payer: Self-pay | Admitting: Internal Medicine

## 2018-06-02 ENCOUNTER — Inpatient Hospital Stay: Payer: PPO | Attending: Oncology | Admitting: Oncology

## 2018-06-02 ENCOUNTER — Inpatient Hospital Stay: Payer: PPO

## 2018-06-02 ENCOUNTER — Ambulatory Visit
Admission: RE | Admit: 2018-06-02 | Discharge: 2018-06-02 | Disposition: A | Discharge status: Home / Self Care | Payer: PPO | Source: Ambulatory Visit | Attending: Oncology | Admitting: Oncology

## 2018-06-02 ENCOUNTER — Encounter: Payer: Self-pay | Admitting: Oncology

## 2018-06-02 VITALS — BP 107/68 | HR 82 | Temp 98.7°F | Resp 18 | Ht 63.0 in | Wt 148.4 lb

## 2018-06-02 DIAGNOSIS — R600 Localized edema: Secondary | ICD-10-CM | POA: Diagnosis not present

## 2018-06-02 DIAGNOSIS — Z79899 Other long term (current) drug therapy: Secondary | ICD-10-CM | POA: Insufficient documentation

## 2018-06-02 DIAGNOSIS — Z9011 Acquired absence of right breast and nipple: Secondary | ICD-10-CM | POA: Insufficient documentation

## 2018-06-02 DIAGNOSIS — Z08 Encounter for follow-up examination after completed treatment for malignant neoplasm: Secondary | ICD-10-CM

## 2018-06-02 DIAGNOSIS — Z853 Personal history of malignant neoplasm of breast: Secondary | ICD-10-CM | POA: Insufficient documentation

## 2018-06-02 DIAGNOSIS — Z170 Estrogen receptor positive status [ER+]: Secondary | ICD-10-CM | POA: Diagnosis not present

## 2018-06-02 DIAGNOSIS — Z923 Personal history of irradiation: Secondary | ICD-10-CM | POA: Insufficient documentation

## 2018-06-02 DIAGNOSIS — Z79810 Long term (current) use of selective estrogen receptor modulators (SERMs): Secondary | ICD-10-CM | POA: Diagnosis not present

## 2018-06-02 DIAGNOSIS — M7989 Other specified soft tissue disorders: Secondary | ICD-10-CM | POA: Insufficient documentation

## 2018-06-02 DIAGNOSIS — D649 Anemia, unspecified: Secondary | ICD-10-CM | POA: Insufficient documentation

## 2018-06-02 DIAGNOSIS — C50911 Malignant neoplasm of unspecified site of right female breast: Secondary | ICD-10-CM | POA: Insufficient documentation

## 2018-06-02 DIAGNOSIS — Z9223 Personal history of estrogen therapy: Secondary | ICD-10-CM | POA: Diagnosis not present

## 2018-06-02 LAB — COMPREHENSIVE METABOLIC PANEL
ALBUMIN: 4 g/dL (ref 3.5–5.0)
ALK PHOS: 34 U/L — AB (ref 38–126)
ALT: 16 U/L (ref 0–44)
AST: 27 U/L (ref 15–41)
Anion gap: 12 (ref 5–15)
BUN: 49 mg/dL — AB (ref 8–23)
CALCIUM: 9.1 mg/dL (ref 8.9–10.3)
CHLORIDE: 104 mmol/L (ref 98–111)
CO2: 22 mmol/L (ref 22–32)
CREATININE: 1.59 mg/dL — AB (ref 0.44–1.00)
GFR calc Af Amer: 36 mL/min — ABNORMAL LOW (ref >60)
GFR calc non Af Amer: 31 mL/min — ABNORMAL LOW (ref >60)
GLUCOSE: 160 mg/dL — AB (ref 70–99)
Potassium: 3.3 mmol/L — ABNORMAL LOW (ref 3.5–5.1)
SODIUM: 138 mmol/L (ref 135–145)
Total Bilirubin: 0.4 mg/dL (ref 0.3–1.2)
Total Protein: 7.2 g/dL (ref 6.5–8.1)

## 2018-06-02 LAB — IRON AND TIBC
Iron: 38 ug/dL (ref 28–170)
Saturation Ratios: 10 % — ABNORMAL LOW (ref 10.4–31.8)
TIBC: 367 ug/dL (ref 250–450)
UIBC: 329 ug/dL

## 2018-06-02 LAB — CBC WITH DIFFERENTIAL/PLATELET
BASOS PCT: 1 %
Basophils Absolute: 0.1 10*3/uL (ref 0–0.1)
Eosinophils Absolute: 0.1 10*3/uL (ref 0–0.7)
Eosinophils Relative: 1 %
HCT: 34.5 % — ABNORMAL LOW (ref 35.0–47.0)
HEMOGLOBIN: 11.3 g/dL — AB (ref 12.0–16.0)
Lymphocytes Relative: 23 %
Lymphs Abs: 1.9 10*3/uL (ref 1.0–3.6)
MCH: 31 pg (ref 26.0–34.0)
MCHC: 32.9 g/dL (ref 32.0–36.0)
MCV: 94.3 fL (ref 80.0–100.0)
MONOS PCT: 6 %
Monocytes Absolute: 0.5 10*3/uL (ref 0.2–0.9)
NEUTROS ABS: 5.9 10*3/uL (ref 1.4–6.5)
NEUTROS PCT: 69 %
Platelets: 296 10*3/uL (ref 150–440)
RBC: 3.65 MIL/uL — ABNORMAL LOW (ref 3.80–5.20)
RDW: 18.1 % — ABNORMAL HIGH (ref 11.5–14.5)
WBC: 8.4 10*3/uL (ref 3.6–11.0)

## 2018-06-02 LAB — FERRITIN: FERRITIN: 225 ng/mL (ref 11–307)

## 2018-06-02 NOTE — Progress Notes (Signed)
Hematology/Oncology Consult note [facility]  Telephone:([phone] Fax:([phone]  Patient Care Team: Tracie Harrier, MD as PCP - General (Internal Medicine)   Name of the patient: [name]  [id number]  08-07-1941   Date of visit: 06/02/18  Diagnosis- rightbreast cancer stage I [id number] diagnosed in January 2014 ER/PR positive and HER-2/neu negative   Chief complaint/ Reason for visit- routine f/u of breast cancer and evaluation for leg swelling and easy bruising  Heme/Onc history: Patient is a 77 year old female who was initially diagnosed with right breast cancer in 1992 status post lumpectomy and axillary lymph node dissection. She received adjuvant radiation therapy followed by 5 years of tamoxifen. In January 2014 she was diagnosed with second primary right breast cancer and underwent a mastectomy at that time. Oncotype score came back at low risk and patient did not require adjuvant chemotherapy. She could not tolerate AI and was started on tamoxifen for hormone therapy. She also has osteoporosis for which she takes vitamin D and Fosamax  Recent bone density scan from July 2018 revealed a T score of -1.8 in the left forearm, -1.5 with a left femoral neck.  10-year probability of a major hip fracture was 2.8% and that of a major osteoporotic fracture was 16%   Interval history- she has been gettng progressive lower extremity edema over last couple of months. Liver functions and kidney functions checked with Dr. [name] were normal. She was also treated for possible cellulitis without much benefit. Also noted to be more anemic with hb around 10  ECOG PS- 1 Pain scale- 0 Opioid associated constipation- no  Review of systems- Review of Systems  Constitutional: Positive for malaise/fatigue. Negative for chills, fever and weight loss.  HENT: Negative for congestion, ear discharge and nosebleeds.   Eyes: Negative for blurred vision.    Respiratory: Negative for cough, hemoptysis, sputum production, shortness of breath and wheezing.   Cardiovascular: Positive for leg swelling. Negative for chest pain, palpitations, orthopnea and claudication.  Gastrointestinal: Negative for abdominal pain, blood in stool, constipation, diarrhea, heartburn, melena, nausea and vomiting.  Genitourinary: Negative for dysuria, flank pain, frequency, hematuria and urgency.  Musculoskeletal: Negative for back pain, joint pain and myalgias.  Skin: Negative for rash.  Neurological: Negative for dizziness, tingling, focal weakness, seizures, weakness and headaches.  Endo/Heme/Allergies: Does not bruise/bleed easily.  Psychiatric/Behavioral: Negative for depression and suicidal ideas. The patient does not have insomnia.       Allergies  Allergen Reactions  . Other Anaphylaxis    Anesthisia has been a health issue for her in the past.   . Sulfa Antibiotics Rash     Past Medical History:  Diagnosis Date  . Arthritis   . Breast cancer ([place]) 1992   right breast with lumpectomy and rad tx  . Cancer of right breast ([place]) 04/16/2013   right breast with mastectomy  . Diabetes mellitus without complication ([place])   . Hyperlipidemia   . Hypertension   . Personal history of radiation therapy      Past Surgical History:  Procedure Laterality Date  . ABDOMINAL HYSTERECTOMY    . BREAST LUMPECTOMY Right 1992   positive  . MASTECTOMY Right 2014    Social History   Socioeconomic History  . Marital status: Married    Spouse name: Not on file  . Number of children: Not on file  . Years of education: Not on file  . Highest education level: Not on file  Occupational History  .  Not on file  Social Needs  . Financial resource strain: Not on file  . Food insecurity:    Worry: Not on file    Inability: Not on file  . Transportation needs:    Medical: Not on file    Non-medical: Not on file  Tobacco Use  . Smoking status: Never Smoker  .  Smokeless tobacco: Never Used  Substance and Sexual Activity  . Alcohol use: No    Alcohol/week: 0.0 oz  . Drug use: No  . Sexual activity: Not on file  Lifestyle  . Physical activity:    Days per week: Not on file    Minutes per session: Not on file  . Stress: Not on file  Relationships  . Social connections:    Talks on phone: Not on file    Gets together: Not on file    Attends religious service: Not on file    Active member of club or organization: Not on file    Attends meetings of clubs or organizations: Not on file    Relationship status: Not on file  . Intimate partner violence:    Fear of current or ex partner: Not on file    Emotionally abused: Not on file    Physically abused: Not on file    Forced sexual activity: Not on file  Other Topics Concern  . Not on file  Social History Narrative  . Not on file    Family History  Problem Relation Age of Onset  . Breast cancer Neg Hx      Current Outpatient Medications:  .  alendronate (FOSAMAX) 70 MG tablet, Take by mouth., Disp: , Rfl:  .  atenolol (TENORMIN) 50 MG tablet, Take 50 mg by mouth daily. , Disp: , Rfl:  .  atorvastatin (LIPITOR) 40 MG tablet, Take 40 mg by mouth daily. , Disp: , Rfl:  .  furosemide (LASIX) 20 MG tablet, Take 20 mg by mouth daily. , Disp: , Rfl:  .  levothyroxine (SYNTHROID, LEVOTHROID) 75 MCG tablet, Take 75 mcg by mouth daily. , Disp: , Rfl:  .  lisinopril-hydrochlorothiazide (PRINZIDE,ZESTORETIC) 20-12.5 MG tablet, Take 1 tablet by mouth daily. , Disp: , Rfl:  .  metFORMIN (GLUCOPHAGE) 1000 MG tablet, Take 1,000 mg by mouth daily with breakfast. , Disp: , Rfl:  .  tamoxifen (NOLVADEX) 20 MG tablet, TAKE 1 TABLET BY MOUTH ONCE DAILY., Disp: 90 tablet, Rfl: 0 .  Vitamin D, Ergocalciferol, (DRISDOL) 50000 UNITS CAPS capsule, Take by mouth., Disp: , Rfl:   Physical exam:  Vitals:   06/02/18 1426  BP: 107/68  Pulse: 82  Resp: 18  Temp: 98.7 F (37.1 C)  TempSrc: Tympanic  Weight:  148 lb 5.9 oz (67.3 kg)  Height: '5\' 3"'$  (1.6 m)   Physical Exam  Constitutional: She is oriented to person, place, and time.  Elderly lady who ambulates with a cane. Appears in no acute distress  HENT:  Head: Normocephalic and atraumatic.  Eyes: Pupils are equal, round, and reactive to light. EOM are normal.  Neck: Normal range of motion.  Cardiovascular: Normal rate, regular rhythm and normal heart sounds.  Pulmonary/Chest: Effort normal and breath sounds normal.  Abdominal: Soft. Bowel sounds are normal.  Musculoskeletal: She exhibits edema (b/l +2).  She has mild erythema over b/l LE  Neurological: She is alert and oriented to person, place, and time.  Skin: Skin is warm and dry.     CMP Latest Ref Rng & Units 11/19/2016  Glucose 65 - 99 mg/dL 134(H)  BUN 6 - 20 mg/dL 33(H)  Creatinine 0.44 - 1.00 mg/dL 1.32(H)  Sodium 135 - 145 mmol/L 135  Potassium 3.5 - 5.1 mmol/L 3.7  Chloride 101 - 111 mmol/L 100(L)  CO2 22 - 32 mmol/L 25  Calcium 8.9 - 10.3 mg/dL 9.3  Total Protein 6.5 - 8.1 g/dL 7.3  Total Bilirubin 0.3 - 1.2 mg/dL 0.3  Alkaline Phos 38 - 126 U/L 34(L)  AST 15 - 41 U/L 21  ALT 14 - 54 U/L 15   CBC Latest Ref Rng & Units 11/19/2016  WBC 3.6 - 11.0 K/uL 9.4  Hemoglobin 12.0 - 16.0 g/dL 11.9(L)  Hematocrit 35.0 - 47.0 % 36.8  Platelets 150 - 440 K/uL 267     Assessment and plan- Patient is a 77 y.o. female with a history ofStage Iright breast cancer in 2014 status post mastectomy and currently on tamoxifen for hormone therapy. She is here for following issues:  1. Leg edema- I will check [place] b/l LE extremities to r/o DVT given that she is on tamoxifen. US abdomen ordered by Dr. [name]. Will also repeat kidney functions today. She may need cardiologye valuation  . Easy bruising- bruising noted mainly over forearms not elsewhere. No h/o blood in stool or urine. This is likely due to skin fragility. Continue to monitor  3. Normocytic anemia- will repeat anemia work up  including CBC, CMP, ferritin and iron studies, B12 and haptoglobin.  We will also check reticulocytes and myeloma panel.  Recent folate levels were normal.  I will see her back in 2 weeks time to discuss the results of her blood work.  4.  History of breast cancer: Mammogram from feb 2019 revealed no evidence of malignancy.  She will continue tamoxifen at this time     Visit Diagnosis 1. Encounter for follow-up surveillance of breast cancer   2. Normocytic anemia   3. Leg swelling      Dr. Randa Evens, MD, MPH [facility] [phone] 06/03/2018 8:06 AM

## 2018-06-03 ENCOUNTER — Other Ambulatory Visit: Payer: Self-pay | Admitting: Internal Medicine

## 2018-06-03 DIAGNOSIS — D649 Anemia, unspecified: Secondary | ICD-10-CM

## 2018-06-03 DIAGNOSIS — R945 Abnormal results of liver function studies: Secondary | ICD-10-CM

## 2018-06-03 DIAGNOSIS — R7989 Other specified abnormal findings of blood chemistry: Secondary | ICD-10-CM

## 2018-06-03 LAB — HAPTOGLOBIN: Haptoglobin: 282 mg/dL — ABNORMAL HIGH (ref 34–200)

## 2018-06-03 LAB — RETICULOCYTES
RBC.: 3.56 MIL/uL — ABNORMAL LOW (ref 3.80–5.20)
RETIC CT PCT: 2.1 % (ref 0.4–3.1)
Retic Count, Absolute: 74.8 10*3/uL (ref 19.0–183.0)

## 2018-06-03 LAB — MULTIPLE MYELOMA PANEL, SERUM
Albumin SerPl Elph-Mcnc: 3.3 g/dL (ref 2.9–4.4)
Albumin/Glob SerPl: 1.1 (ref 0.7–1.7)
Alpha 1: 0.4 g/dL (ref 0.0–0.4)
Alpha2 Glob SerPl Elph-Mcnc: 1.1 g/dL — ABNORMAL HIGH (ref 0.4–1.0)
B-Globulin SerPl Elph-Mcnc: 1.1 g/dL (ref 0.7–1.3)
Gamma Glob SerPl Elph-Mcnc: 0.7 g/dL (ref 0.4–1.8)
Globulin, Total: 3.2 g/dL (ref 2.2–3.9)
IgA: 183 mg/dL (ref 64–422)
IgG (Immunoglobin G), Serum: 753 mg/dL (ref 700–1600)
IgM (Immunoglobulin M), Srm: 46 mg/dL (ref 26–217)
Total Protein ELP: 6.5 g/dL (ref 6.0–8.5)

## 2018-06-03 LAB — VITAMIN B12: Vitamin B-12: 1487 pg/mL — ABNORMAL HIGH (ref 180–914)

## 2018-06-05 ENCOUNTER — Ambulatory Visit: Payer: PPO

## 2018-06-06 ENCOUNTER — Ambulatory Visit
Admission: RE | Admit: 2018-06-06 | Discharge: 2018-06-06 | Disposition: A | Discharge status: Home / Self Care | Payer: PPO | Source: Ambulatory Visit | Attending: Internal Medicine | Admitting: Internal Medicine

## 2018-06-06 DIAGNOSIS — K760 Fatty (change of) liver, not elsewhere classified: Secondary | ICD-10-CM | POA: Diagnosis not present

## 2018-06-06 DIAGNOSIS — R600 Localized edema: Secondary | ICD-10-CM | POA: Diagnosis not present

## 2018-06-06 DIAGNOSIS — R945 Abnormal results of liver function studies: Secondary | ICD-10-CM | POA: Insufficient documentation

## 2018-06-06 DIAGNOSIS — R7989 Other specified abnormal findings of blood chemistry: Secondary | ICD-10-CM

## 2018-06-06 DIAGNOSIS — D649 Anemia, unspecified: Secondary | ICD-10-CM | POA: Diagnosis not present

## 2018-06-16 ENCOUNTER — Other Ambulatory Visit: Payer: PPO

## 2018-06-16 ENCOUNTER — Encounter: Payer: Self-pay | Admitting: Oncology

## 2018-06-16 ENCOUNTER — Inpatient Hospital Stay: Payer: PPO | Attending: Oncology | Admitting: Oncology

## 2018-06-16 VITALS — BP 108/72 | HR 81 | Temp 98.2°F | Resp 18 | Ht 63.0 in | Wt 151.0 lb

## 2018-06-16 DIAGNOSIS — C50911 Malignant neoplasm of unspecified site of right female breast: Secondary | ICD-10-CM | POA: Diagnosis not present

## 2018-06-16 DIAGNOSIS — R7989 Other specified abnormal findings of blood chemistry: Secondary | ICD-10-CM

## 2018-06-16 DIAGNOSIS — Z853 Personal history of malignant neoplasm of breast: Secondary | ICD-10-CM

## 2018-06-16 DIAGNOSIS — M79661 Pain in right lower leg: Secondary | ICD-10-CM | POA: Insufficient documentation

## 2018-06-16 DIAGNOSIS — Z9011 Acquired absence of right breast and nipple: Secondary | ICD-10-CM | POA: Diagnosis not present

## 2018-06-16 DIAGNOSIS — Z79810 Long term (current) use of selective estrogen receptor modulators (SERMs): Secondary | ICD-10-CM | POA: Insufficient documentation

## 2018-06-16 DIAGNOSIS — M7989 Other specified soft tissue disorders: Secondary | ICD-10-CM | POA: Insufficient documentation

## 2018-06-16 DIAGNOSIS — M79662 Pain in left lower leg: Secondary | ICD-10-CM | POA: Insufficient documentation

## 2018-06-16 DIAGNOSIS — R944 Abnormal results of kidney function studies: Secondary | ICD-10-CM | POA: Diagnosis not present

## 2018-06-16 MED ORDER — CEPHALEXIN 500 MG PO CAPS: 500.0000 mg | ORAL_CAPSULE | Freq: Two times a day (BID) | ORAL | 0 refills | Status: DC

## 2018-06-16 NOTE — Progress Notes (Signed)
Pt bilateral lower leg swelling and redness is worse. It is hot to touch and right leg more swollen than before.

## 2018-06-17 NOTE — Progress Notes (Signed)
Hematology/Oncology Consult note [facility]  Telephone:([phone] [phone]  Patient Care Team: Tracie Harrier, MD as PCP - General (Internal Medicine)   Name of the patient: [name]  [id number]  1940/12/27   Date of visit: 06/17/18  Diagnosis- rightbreast cancer stage I [id number] diagnosed in January 2014 ER/PR positive and HER-2/neu negative   Chief complaint/ Reason for visit- f/u visit for b/l LE edema  Heme/Onc history: Patient is a 77 year old female who was initially diagnosed with right breast cancer in 1992 status post lumpectomy and axillary lymph node dissection. She received adjuvant radiation therapy followed by 5 years of tamoxifen. In January 2014 she was diagnosed with second primary right breast cancer and underwent a mastectomy at that time. Oncotype score came back at low risk and patient did not require adjuvant chemotherapy. She could not tolerate AI and was started on tamoxifen for hormone therapy. She also has osteoporosis for which she takes vitamin D and Fosamax  Recent bone density scan from July 2018 revealed a T score of -1.8 in the left forearm, -1.5 with a left femoral neck.  10-year probability of a major hip fracture was 2.8% and that of a major osteoporotic fracture was 16%   Interval history- Patient has been having ongoing leg swelling for more than a month now. It is slowly getting worse and making it difficult for her to ambulate  ECOG PS- 1 Pain scale- 3 Opioid associated constipation- no  Review of systems- Review of Systems  Constitutional: Positive for malaise/fatigue. Negative for chills, fever and weight loss.  HENT: Negative for congestion, ear discharge and nosebleeds.   Eyes: Negative for blurred vision.  Respiratory: Negative for cough, hemoptysis, sputum production, shortness of breath and wheezing.   Cardiovascular: Positive for leg swelling. Negative for chest pain,  palpitations, orthopnea and claudication.  Gastrointestinal: Negative for abdominal pain, blood in stool, constipation, diarrhea, heartburn, melena, nausea and vomiting.  Genitourinary: Negative for dysuria, flank pain, frequency, hematuria and urgency.  Musculoskeletal: Negative for back pain, joint pain and myalgias.  Skin: Negative for rash.  Neurological: Negative for dizziness, tingling, focal weakness, seizures, weakness and headaches.  Endo/Heme/Allergies: Does not bruise/bleed easily.  Psychiatric/Behavioral: Negative for depression and suicidal ideas. The patient does not have insomnia.       Allergies  Allergen Reactions  . Other Anaphylaxis    Anesthisia has been a health issue for her in the past.   . Sulfa Antibiotics Rash     Past Medical History:  Diagnosis Date  . Arthritis   . Breast cancer ([place]) 1992   right breast with lumpectomy and rad tx  . Cancer of right breast ([place]) 04/16/2013   right breast with mastectomy  . Diabetes mellitus without complication ([place])   . Hyperlipidemia   . Hypertension   . Personal history of radiation therapy      Past Surgical History:  Procedure Laterality Date  . ABDOMINAL HYSTERECTOMY    . BREAST LUMPECTOMY Right 1992   positive  . MASTECTOMY Right 2014    Social History   Socioeconomic History  . Marital status: Married    Spouse name: Not on file  . Number of children: Not on file  . Years of education: Not on file  . Highest education level: Not on file  Occupational History  . Not on file  Social Needs  . Financial resource strain: Not on file  . Food insecurity:    Worry: Not on file  Inability: Not on file  . Transportation needs:    Medical: Not on file    Non-medical: Not on file  Tobacco Use  . Smoking status: Never Smoker  . Smokeless tobacco: Never Used  Substance and Sexual Activity  . Alcohol use: No    Alcohol/week: 0.0 oz  . Drug use: No  . Sexual activity: Not on file  Lifestyle  .  Physical activity:    Days per week: Not on file    Minutes per session: Not on file  . Stress: Not on file  Relationships  . Social connections:    Talks on phone: Not on file    Gets together: Not on file    Attends religious service: Not on file    Active member of club or organization: Not on file    Attends meetings of clubs or organizations: Not on file    Relationship status: Not on file  . Intimate partner violence:    Fear of current or ex partner: Not on file    Emotionally abused: Not on file    Physically abused: Not on file    Forced sexual activity: Not on file  Other Topics Concern  . Not on file  Social History Narrative  . Not on file    Family History  Problem Relation Age of Onset  . Breast cancer Neg Hx      Current Outpatient Medications:  .  alendronate (FOSAMAX) 70 MG tablet, Take 70 mg by mouth once a week. , Disp: , Rfl:  .  Ascorbic Acid (VITAMIN C) 1000 MG tablet, Take 1,000 mg by mouth daily., Disp: , Rfl:  .  atenolol (TENORMIN) 50 MG tablet, Take 50 mg by mouth daily. , Disp: , Rfl:  .  atorvastatin (LIPITOR) 40 MG tablet, Take 40 mg by mouth daily. , Disp: , Rfl:  .  furosemide (LASIX) 40 MG tablet, Take 40 mg by mouth daily., Disp: , Rfl:  .  levothyroxine (SYNTHROID, LEVOTHROID) 75 MCG tablet, Take 75 mcg by mouth daily. , Disp: , Rfl:  .  lisinopril-hydrochlorothiazide (PRINZIDE,ZESTORETIC) 20-12.5 MG tablet, Take 1 tablet by mouth daily. , Disp: , Rfl:  .  metFORMIN (GLUCOPHAGE) 1000 MG tablet, Take 1,000 mg by mouth daily with breakfast. , Disp: , Rfl:  .  tamoxifen (NOLVADEX) 20 MG tablet, TAKE 1 TABLET BY MOUTH ONCE DAILY., Disp: 90 tablet, Rfl: 0 .  Vitamin D, Ergocalciferol, (DRISDOL) 50000 UNITS CAPS capsule, Take 50,000 Units by mouth every 30 (thirty) days. , Disp: , Rfl:  .  cephALEXin (KEFLEX) 500 MG capsule, Take 1 capsule (500 mg total) by mouth 2 (two) times daily., Disp: 14 capsule, Rfl: 0 .  furosemide (LASIX) 20 MG tablet,  Take 20 mg by mouth daily. , Disp: , Rfl:   Physical exam:  Vitals:   06/16/18 1407  BP: 108/72  Pulse: 81  Resp: 18  Temp: 98.2 F (36.8 C)  TempSrc: Tympanic  Weight: 151 lb 0.2 oz (68.5 kg)  Height: '5\' 3"'$  (1.6 m)   Physical Exam  Constitutional: She is oriented to person, place, and time. She appears well-developed and well-nourished.  HENT:  Head: Normocephalic and atraumatic.  Eyes: Pupils are equal, round, and reactive to light. EOM are normal.  Neck: Normal range of motion.  Cardiovascular: Normal rate, regular rhythm and normal heart sounds.  Pulmonary/Chest: Effort normal and breath sounds normal.  Abdominal: Soft. Bowel sounds are normal.  Musculoskeletal: She exhibits edema (b/l pitting edema).  Neurological: She is alert and oriented to person, place, and time.  Skin: Skin is warm and dry.  Skin over b/l legs appears erythematous right worse than left. Local warmth     CMP Latest Ref Rng & Units 06/02/2018  Glucose 70 - 99 mg/dL 160(H)  BUN 8 - 23 mg/dL 49(H)  Creatinine 0.44 - 1.00 mg/dL 1.59(H)  Sodium 135 - 145 mmol/L 138  Potassium 3.5 - 5.1 mmol/L 3.3(L)  Chloride 98 - 111 mmol/L 104  CO2 22 - 32 mmol/L 22  Calcium 8.9 - 10.3 mg/dL 9.1  Total Protein 6.5 - 8.1 g/dL 7.2  Total Bilirubin 0.3 - 1.2 mg/dL 0.4  Alkaline Phos 38 - 126 U/L 34(L)  AST 15 - 41 U/L 27  ALT 0 - 44 U/L 16   CBC Latest Ref Rng & Units 06/02/2018  WBC 3.6 - 11.0 K/uL 8.4  Hemoglobin 12.0 - 16.0 g/dL 11.3(L)  Hematocrit 35.0 - 47.0 % 34.5(L)  Platelets 150 - 440 K/uL 296    No images are attached to the encounter.  US Abdomen Complete  Result Date: 06/06/2018 CLINICAL DATA:  77 year old female with anemia and abnormal LFTs. Remote history of breast cancer. EXAM: ABDOMEN ULTRASOUND COMPLETE COMPARISON:  PET-CT 12/23/2012. FINDINGS: Gallbladder: Surgically absent as demonstrated in 2014. Common bile duct: Diameter: 6 millimeters, normal. Liver: Echogenic liver (images 23, 41). No  discrete liver lesion. No intrahepatic biliary ductal dilatation. Portal vein is patent on color Doppler imaging with normal direction of blood flow towards the liver. IVC: No abnormality visualized. Pancreas: Visualized portion unremarkable. Spleen: Size and appearance within normal limits. Right Kidney: Length: 9.7 centimeters. Echogenicity within normal limits. No mass or hydronephrosis visualized. Left Kidney: Length: 10.0 centimeters. Echogenicity within normal limits. No mass or hydronephrosis visualized. Abdominal aorta: Atherosclerosis.  No aneurysm visualized. Other findings: None. IMPRESSION: 1. Fatty liver disease. 2. No other ultrasound abnormality in the abdomen. Electronically Signed   By: Genevie Ann M.D.   On: 06/06/2018 13:37   US Venous Img Lower Bilateral  Result Date: 06/02/2018 CLINICAL DATA:  Bilateral leg edema for 2 months EXAM: BILATERAL LOWER EXTREMITY VENOUS DUPLEX ULTRASOUND TECHNIQUE: Doppler venous assessment of the bilateral lower extremity deep venous system was performed, including characterization of spectral flow, compressibility, and phasicity. COMPARISON:  None. FINDINGS: There is complete compressibility of the bilateral common femoral, femoral, and popliteal veins. Doppler analysis demonstrates respiratory phasicity and augmentation of flow with calf compression. No obvious superficial vein or calf vein thrombosis. IMPRESSION: No evidence of lower extremity DVT. Electronically Signed   By: Marybelle Killings M.D.   On: 06/02/2018 17:15     Assessment and plan- Patient is a 77 y.o. female history ofStage Iright breast cancer in 2014 status post mastectomy and currently on tamoxifen for hormone therapy. She is here for ongoing evaluation of her lower extremity edema  B/l leg edema- [place] LE showed no DVT. US abdomen showed no inguinal adenopathy. She has been treated with 2 courses of antibiotics in the recent past. Redness gets better but swelling has persisted. I will prescribe  10 days of keflex 500 mg Bid for possible superimposed cellulitis. Cause of her lower extremity edema is still unclear. I doubt this is due to tamoxifen. She has been on it for few years now and has not had any issues with it. I have nevertheless asked her to stop taking it for 3 weeks. I will obtan ECHo to rule out cardiac causes for leg edema. Also refer her  to nephrology given that her creatinine is rising slowly as well whether that has anything to do with her leg edema. I have suggested that if above measures dont reveal a cause, she needs to discuss further with Dr. [name] and possibly see Dr. Lucky Cowboy who she has seen in the past. I will keep Dr. [name] informed on this as well  I will see her back in 3 months- cbc/cmp     Visit Diagnosis 1. H/O malignant neoplasm of breast   2. Pain and swelling of right lower leg   3. Pain and swelling of left lower leg   4. Creatinine elevation      Dr. Randa Evens, MD, MPH [facility] [phone] 06/17/2018 12:09 PM

## 2018-06-20 ENCOUNTER — Ambulatory Visit
Admission: RE | Admit: 2018-06-20 | Discharge: 2018-06-20 | Disposition: A | Discharge status: Home / Self Care | Payer: PPO | Source: Ambulatory Visit | Attending: Oncology | Admitting: Oncology

## 2018-06-20 DIAGNOSIS — M7989 Other specified soft tissue disorders: Secondary | ICD-10-CM | POA: Insufficient documentation

## 2018-06-20 DIAGNOSIS — E119 Type 2 diabetes mellitus without complications: Secondary | ICD-10-CM | POA: Insufficient documentation

## 2018-06-20 DIAGNOSIS — E785 Hyperlipidemia, unspecified: Secondary | ICD-10-CM | POA: Diagnosis not present

## 2018-06-20 DIAGNOSIS — M79662 Pain in left lower leg: Secondary | ICD-10-CM | POA: Diagnosis not present

## 2018-06-20 DIAGNOSIS — M79661 Pain in right lower leg: Secondary | ICD-10-CM | POA: Diagnosis not present

## 2018-06-20 DIAGNOSIS — Z853 Personal history of malignant neoplasm of breast: Secondary | ICD-10-CM | POA: Insufficient documentation

## 2018-06-20 DIAGNOSIS — Z923 Personal history of irradiation: Secondary | ICD-10-CM | POA: Diagnosis not present

## 2018-06-20 DIAGNOSIS — I10 Essential (primary) hypertension: Secondary | ICD-10-CM | POA: Diagnosis not present

## 2018-06-20 NOTE — Progress Notes (Signed)
*  PRELIMINARY RESULTS* Echocardiogram 2D Echocardiogram has been performed.  Sherrie Sport 06/20/2018, 11:32 AM

## 2018-06-24 ENCOUNTER — Telehealth: Payer: Self-pay | Admitting: *Deleted

## 2018-06-24 DIAGNOSIS — I87303 Chronic venous hypertension (idiopathic) without complications of bilateral lower extremity: Secondary | ICD-10-CM | POA: Diagnosis not present

## 2018-06-24 DIAGNOSIS — M1A00X0 Idiopathic chronic gout, unspecified site, without tophus (tophi): Secondary | ICD-10-CM | POA: Diagnosis not present

## 2018-06-24 DIAGNOSIS — M810 Age-related osteoporosis without current pathological fracture: Secondary | ICD-10-CM | POA: Diagnosis not present

## 2018-06-24 DIAGNOSIS — E119 Type 2 diabetes mellitus without complications: Secondary | ICD-10-CM | POA: Diagnosis not present

## 2018-06-24 DIAGNOSIS — M1712 Unilateral primary osteoarthritis, left knee: Secondary | ICD-10-CM | POA: Diagnosis not present

## 2018-06-24 NOTE — Telephone Encounter (Signed)
I called pt and got her voicemail and left her a message that Dr. Janese Banks said her ejection fraction which is how strong heart it. Hers is normal 55-60%. Please call if you have questions.

## 2018-06-24 NOTE — Telephone Encounter (Signed)
-----   Message from [place] sent at 06/24/2018  1:11 PM EDT ----- Pt wants a call about her echo and results

## 2018-06-25 DIAGNOSIS — L97919 Non-pressure chronic ulcer of unspecified part of right lower leg with unspecified severity: Secondary | ICD-10-CM | POA: Diagnosis not present

## 2018-06-25 DIAGNOSIS — I872 Venous insufficiency (chronic) (peripheral): Secondary | ICD-10-CM | POA: Diagnosis not present

## 2018-06-25 DIAGNOSIS — I83019 Varicose veins of right lower extremity with ulcer of unspecified site: Secondary | ICD-10-CM | POA: Diagnosis not present

## 2018-07-02 ENCOUNTER — Telehealth: Payer: Self-pay | Admitting: *Deleted

## 2018-07-02 DIAGNOSIS — I83019 Varicose veins of right lower extremity with ulcer of unspecified site: Secondary | ICD-10-CM | POA: Diagnosis not present

## 2018-07-02 DIAGNOSIS — I872 Venous insufficiency (chronic) (peripheral): Secondary | ICD-10-CM | POA: Diagnosis not present

## 2018-07-02 DIAGNOSIS — L97919 Non-pressure chronic ulcer of unspecified part of right lower leg with unspecified severity: Secondary | ICD-10-CM | POA: Diagnosis not present

## 2018-07-02 NOTE — Telephone Encounter (Signed)
-----   Message from Secundino Ginger sent at 06/30/2018 11:41 AM EDT ----- Regarding: patient called  [name] this patient called and said someone left her a msg on Friday to call [place] [phone]). She cannot get anyone to answer and she was under the assumption that she would see Dr Holley Raring in [place] at [facility]. She said everything is a mix up here and hung up on me. I don't know anything about this. Sorry

## 2018-07-02 NOTE — Telephone Encounter (Signed)
Called back to patient and she was not there. I told her emergency contact person that I had called and left message about her ECHO-55-60% and if she had questions she could call me. He said I know she got this message because I told her about it. I did not say anything about a kidney doctor. He states he will tell her when she gets back. She had another call from another doctor's office and maybe it was from the other office

## 2018-07-08 ENCOUNTER — Encounter (INDEPENDENT_AMBULATORY_CARE_PROVIDER_SITE_OTHER): Payer: Self-pay | Admitting: Vascular Surgery

## 2018-07-08 ENCOUNTER — Ambulatory Visit (INDEPENDENT_AMBULATORY_CARE_PROVIDER_SITE_OTHER): Payer: PPO | Admitting: Vascular Surgery

## 2018-07-08 VITALS — BP 110/61 | HR 84 | Resp 16 | Ht 59.0 in | Wt 146.6 lb

## 2018-07-08 DIAGNOSIS — E118 Type 2 diabetes mellitus with unspecified complications: Secondary | ICD-10-CM | POA: Diagnosis not present

## 2018-07-08 DIAGNOSIS — I890 Lymphedema, not elsewhere classified: Secondary | ICD-10-CM

## 2018-07-08 DIAGNOSIS — I10 Essential (primary) hypertension: Secondary | ICD-10-CM

## 2018-07-08 DIAGNOSIS — E785 Hyperlipidemia, unspecified: Secondary | ICD-10-CM

## 2018-07-08 NOTE — Patient Instructions (Signed)

## 2018-07-08 NOTE — Progress Notes (Signed)
MRN : [id number]  [name] is a 77 y.o. (August 18, 1941) female who presents with chief complaint of  Chief Complaint  Patient presents with  . Follow-up    bilateral leg swelling and weeping  .  History of Present Illness: Patient returns today in follow up of worsening swelling and weeping of the lower extremities.  She says they are actually better today than when the appointment was first made a few weeks ago.  The improvement has occurred since adding weekly Unna boots and now only has a small ulceration on the right lateral lower leg.  She says the left leg was actually the worst of the 2 legs when the wraps started.  They are currently not all that painful.  She does have compression stockings she can wear once her wounds have completely healed.  She had a negative arterial and venous work-up in the past and also had a negative DVT study in the past few weeks.  Current Outpatient Medications  Medication Sig Dispense Refill  . alendronate (FOSAMAX) 70 MG tablet Take 70 mg by mouth once a week.     . Ascorbic Acid (VITAMIN C) 1000 MG tablet Take 1,000 mg by mouth daily.    Marland Kitchen atenolol (TENORMIN) 50 MG tablet Take 50 mg by mouth daily.     Marland Kitchen atorvastatin (LIPITOR) 40 MG tablet Take 40 mg by mouth daily.     . furosemide (LASIX) 20 MG tablet Take 20 mg by mouth daily.     . furosemide (LASIX) 40 MG tablet Take 40 mg by mouth daily.    Marland Kitchen levothyroxine (SYNTHROID, LEVOTHROID) 75 MCG tablet Take 75 mcg by mouth daily.     Marland Kitchen lisinopril-hydrochlorothiazide (PRINZIDE,ZESTORETIC) 20-12.5 MG tablet Take 1 tablet by mouth daily.     . metFORMIN (GLUCOPHAGE) 1000 MG tablet Take 1,000 mg by mouth daily with breakfast.     . tamoxifen (NOLVADEX) 20 MG tablet TAKE 1 TABLET BY MOUTH ONCE DAILY. 90 tablet 0  . Vitamin D, Ergocalciferol, (DRISDOL) 50000 UNITS CAPS capsule Take 50,000 Units by mouth every 30 (thirty) days.     . cephALEXin (KEFLEX) 500 MG capsule Take 1 capsule (500 mg total) by  mouth 2 (two) times daily. (Patient not taking: Reported on 07/08/2018) 14 capsule 0   No current facility-administered medications for this visit.     Past Medical History:  Diagnosis Date  . Arthritis   . Breast cancer ([place]) 1992   right breast with lumpectomy and rad tx  . Cancer of right breast ([place]) 04/16/2013   right breast with mastectomy  . Diabetes mellitus without complication ([place])   . Hyperlipidemia   . Hypertension   . Personal history of radiation therapy     Past Surgical History:  Procedure Laterality Date  . ABDOMINAL HYSTERECTOMY    . BREAST LUMPECTOMY Right 1992   positive  . MASTECTOMY Right 2014    Social History Social History   Tobacco Use  . Smoking status: Never Smoker  . Smokeless tobacco: Never Used  Substance Use Topics  . Alcohol use: No    Alcohol/week: 0.0 standard drinks  . Drug use: No      Family History Family History  Problem Relation Age of Onset  . Breast cancer Neg Hx   no bleeding disorders, clotting disorders, autoimmune diseases or aneurysms  Allergies  Allergen Reactions  . Other Anaphylaxis    Anesthisia has been a health issue for her in the past.   .  Sulfa Antibiotics Rash     REVIEW OF SYSTEMS (Negative unless checked)  Constitutional: _0 Weight loss  _1 Fever  _2 Chills Cardiac: _3 Chest pain   _4 Chest pressure   _5 Palpitations   _6 Shortness of breath when laying flat   _7 Shortness of breath at rest   _8 Shortness of breath with exertion. Vascular:  _9 Pain in legs with walking   _10 Pain in legs at rest   _11 Pain in legs when laying flat   _12 Claudication   _13 Pain in feet when walking  _14 Pain in feet at rest  _15 Pain in feet when laying flat   _16 History of DVT   _17 Phlebitis   _18 Swelling in legs   _19 Varicose veins   _20 Non-healing ulcers Pulmonary:   _21 Uses home oxygen   _22 Productive cough   _23 Hemoptysis   _24 Wheeze  _25 COPD   _26 Asthma Neurologic:  _27 Dizziness  _28 Blackouts   _29 Seizures   _30 History of stroke   _31 History of TIA   _32 Aphasia   _33 Temporary blindness   _34 Dysphagia   _35 Weakness or numbness in arms   _36 Weakness or numbness in legs Musculoskeletal:  _37 Arthritis   _38 Joint swelling   _39 Joint pain   _40 Low back pain Hematologic:  _41 Easy bruising  _42 Easy bleeding   _43 Hypercoagulable state   _44 Anemic   Gastrointestinal:  _45 Blood in stool   _46 Vomiting blood  _47 Gastroesophageal reflux/heartburn   _48 Abdominal pain Genitourinary:  _49 Chronic kidney disease   _50 Difficult urination  _51 Frequent urination  _52 Burning with urination   _53 Hematuria Skin:  _54 Rashes   _55 Ulcers   _56 Wounds Psychological:  _57 History of anxiety   _58  History of major depression.  Physical Examination  BP 110/61 (BP Location: Left Arm)   Pulse 84   Resp 16   Ht _59  (1.499 m)   Wt 146 lb 9.6 oz (66.5 kg)   BMI 29.61 kg/m  Gen:  WD/WN, NAD Head: /AT, No temporalis wasting. Ear/Nose/Throat: Hearing grossly intact, nares w/o erythema or drainage Eyes: Conjunctiva clear. Sclera non-icteric Neck: Supple.  Trachea midline Pulmonary:  Good air movement, no use of accessory muscles.  Cardiac: RRR, no JVD Vascular:  Vessel Right Left  Radial Palpable Palpable                          PT  1+ palpable  1+ palpable  DP Palpable Palpable    Musculoskeletal: M/S 5/5 throughout.  No deformity or atrophy.  1+ bilateral lower extremity edema. Neurologic: Sensation grossly intact in extremities.  Symmetrical.  Speech is fluent.  Psychiatric: Judgment intact, Mood & affect appropriate for pt's clinical situation. Dermatologic: Approximately 1 cm circular ulceration on the right lateral lower leg without erythema or drainage today.  No left lower extremity ulceration seen       Labs Recent Results (from the past 2160 hour(s))  Haptoglobin     Status: Abnormal   Collection Time: 06/02/18  2:58 PM  Result Value Ref Range   Haptoglobin 282 (H) 34 - 200 mg/dL    Comment: (NOTE) Performed At: [facility] [place], [place] [id number] Rush Farmer MD [id number]   Multiple Myeloma Panel (SPEP&IFE w/QIG)     Status: Abnormal   Collection Time: 06/02/18  2:58 PM  Result Value Ref Range   IgG (Immunoglobin G), Serum 753 700 - 1,600 mg/dL   IgA 183 64 - 422 mg/dL   IgM (Immunoglobulin M), Srm 46 26 - 217 mg/dL   Total Protein ELP 6.5 6.0 - 8.5 g/dL   Albumin SerPl Elph-Mcnc  3.3 2.9 - 4.4 g/dL   Alpha 1 0.4 0.0 - 0.4 g/dL   Alpha2 Glob SerPl Elph-Mcnc 1.1 (H) 0.4 - 1.0 g/dL   B-Globulin SerPl Elph-Mcnc 1.1 0.7 - 1.3 g/dL   Gamma Glob SerPl Elph-Mcnc 0.7 0.4 - 1.8 g/dL   M Protein SerPl Elph-Mcnc Not Observed Not Observed g/dL   Globulin, Total 3.2 2.2 - 3.9 g/dL   Albumin/Glob SerPl 1.1 0.7 - 1.7   IFE 1 Comment     Comment: An apparent normal immunofixation pattern.   Please Note Comment     Comment: (NOTE) Protein electrophoresis scan will follow via computer, mail, or courier delivery. Performed At: [facility] [place], [place] [id number] Rush Farmer MD [id number]   Reticulocytes     Status: Abnormal   Collection Time: 06/02/18  2:58 PM  Result Value Ref Range   Retic Ct Pct 2.1 0.4 - 3.1 %   RBC. 3.56 (L) 3.80 - 5.20 MIL/uL   Retic Count, Absolute 74.8 19.0 - 183.0 K/uL    Comment: Performed at [facility], [address] [place], [place] [address]  Iron and TIBC     Status: Abnormal   Collection Time: 06/02/18  2:58 PM  Result Value Ref Range   Iron 38 28 - 170 ug/dL   TIBC 367 250 - 450 ug/dL   Saturation Ratios 10 (L) 10.4 - 31.8 %   UIBC 329 ug/dL    Comment: Performed at [facility], [address] [place], [place] [address]  Ferritin     Status: None   Collection Time: 06/02/18  2:58 PM  Result Value Ref Range   Ferritin 225 11 - 307 ng/mL    Comment: Performed at [facility], [address] [place], [place] [address]  Comprehensive metabolic panel     Status: Abnormal   Collection Time: 06/02/18   2:58 PM  Result Value Ref Range   Sodium 138 135 - 145 mmol/L   Potassium 3.3 (L) 3.5 - 5.1 mmol/L   Chloride 104 98 - 111 mmol/L   CO2 22 22 - 32 mmol/L   Glucose, Bld 160 (H) 70 - 99 mg/dL   BUN 49 (H) 8 - 23 mg/dL   Creatinine, Ser 1.59 (H) 0.44 - 1.00 mg/dL   Calcium 9.1 8.9 - 10.3 mg/dL   Total Protein 7.2 6.5 - 8.1 g/dL   Albumin 4.0 3.5 - 5.0 g/dL   AST 27 15 - 41 U/L   ALT 16 0 - 44 U/L   Alkaline Phosphatase 34 (L) 38 - 126 U/L   Total Bilirubin 0.4 0.3 - 1.2 mg/dL   GFR calc non Af Amer 31 (L) >60 mL/min   GFR calc Af Amer 36 (L) >60 mL/min    Comment: (NOTE) The eGFR has been calculated using the CKD EPI equation. This calculation has not been validated in all clinical situations. eGFR's persistently <60 mL/min signify possible Chronic Kidney Disease.    Anion gap 12 5 - 15    Comment: Performed at [facility], [address], [place], [place] [address]  CBC with Differential/Platelet     Status: Abnormal   Collection Time: 06/02/18  2:58 PM  Result Value Ref Range   WBC 8.4 3.6 - 11.0 K/uL   RBC 3.65 (L) 3.80 - 5.20 MIL/uL   Hemoglobin 11.3 (L) 12.0 - 16.0 g/dL   HCT 34.5 (L) 35.0 - 47.0 %   MCV 94.3 80.0 -  100.0 fL   MCH 31.0 26.0 - 34.0 pg   MCHC 32.9 32.0 - 36.0 g/dL   RDW 18.1 (H) 11.5 - 14.5 %   Platelets 296 150 - 440 K/uL   Neutrophils Relative % 69 %   Neutro Abs 5.9 1.4 - 6.5 K/uL   Lymphocytes Relative 23 %   Lymphs Abs 1.9 1.0 - 3.6 K/uL   Monocytes Relative 6 %   Monocytes Absolute 0.5 0.2 - 0.9 K/uL   Eosinophils Relative 1 %   Eosinophils Absolute 0.1 0 - 0.7 K/uL   Basophils Relative 1 %   Basophils Absolute 0.1 0 - 0.1 K/uL    Comment: Performed at [facility], [address], [place], [place] [address]  Vitamin B12     Status: Abnormal   Collection Time: 06/02/18  2:58 PM  Result Value Ref Range   Vitamin B-12 1,487 (H) 180 - 914 pg/mL    Comment: (NOTE) This assay is not validated for testing neonatal  or myeloproliferative syndrome specimens for Vitamin B12 levels. Performed at [facility], [place] [address] [place], [place] [address]     Radiology No results found.  Assessment/Plan  Diabetes mellitus, type 2 blood glucose control important in reducing the progression of atherosclerotic disease. Also, involved in wound healing. On appropriate medications.   Hypertension blood pressure control important in reducing the progression of atherosclerotic disease. On appropriate oral medications.   HLD (hyperlipidemia) lipid control important in reducing the progression of atherosclerotic disease. Continue statin therapy   Lymphedema The patient clearly has some lymphedema from chronic scarring in the lymphatic channels.  This would be at least stage II lymphedema with ulcerations previously and may be stage III as she had weeping and ulcerations before.  I believe she would benefit from a lymphedema pump and we will try to obtain one.  She will continue with compression in the form of an Unna boot although she is scheduled to see her podiatrist tomorrow so we did not replace one today.  This can be replaced tomorrow.  Once her wounds have completely healed in the next few weeks, I think she can go to compression stockings daily.  The addition of the lymphedema pump should be an excellent adjuvant therapy.  I will see her back in about 2 to 3 months.    [name], MD  07/08/2018 4:48 PM    This note was created with Dragon medical transcription system.  Any errors from dictation are purely unintentional

## 2018-07-08 NOTE — Assessment & Plan Note (Signed)
lipid control important in reducing the progression of atherosclerotic disease. Continue statin therapy  

## 2018-07-08 NOTE — Assessment & Plan Note (Signed)
blood glucose control important in reducing the progression of atherosclerotic disease. Also, involved in wound healing. On appropriate medications.  

## 2018-07-08 NOTE — Assessment & Plan Note (Signed)
The patient clearly has some lymphedema from chronic scarring in the lymphatic channels.  This would be at least stage II lymphedema with ulcerations previously and may be stage III as she had weeping and ulcerations before.  I believe she would benefit from a lymphedema pump and we will try to obtain one.  She will continue with compression in the form of an Unna boot although she is scheduled to see her podiatrist tomorrow so we did not replace one today.  This can be replaced tomorrow.  Once her wounds have completely healed in the next few weeks, I think she can go to compression stockings daily.  The addition of the lymphedema pump should be an excellent adjuvant therapy.  I will see her back in about 2 to 3 months.

## 2018-07-08 NOTE — Assessment & Plan Note (Signed)
blood pressure control important in reducing the progression of atherosclerotic disease. On appropriate oral medications.  

## 2018-07-09 DIAGNOSIS — I890 Lymphedema, not elsewhere classified: Secondary | ICD-10-CM | POA: Diagnosis not present

## 2018-07-09 DIAGNOSIS — L97919 Non-pressure chronic ulcer of unspecified part of right lower leg with unspecified severity: Secondary | ICD-10-CM | POA: Diagnosis not present

## 2018-07-09 DIAGNOSIS — I872 Venous insufficiency (chronic) (peripheral): Secondary | ICD-10-CM | POA: Diagnosis not present

## 2018-07-09 DIAGNOSIS — I83019 Varicose veins of right lower extremity with ulcer of unspecified site: Secondary | ICD-10-CM | POA: Diagnosis not present

## 2018-07-16 DIAGNOSIS — L97919 Non-pressure chronic ulcer of unspecified part of right lower leg with unspecified severity: Secondary | ICD-10-CM | POA: Diagnosis not present

## 2018-07-16 DIAGNOSIS — I872 Venous insufficiency (chronic) (peripheral): Secondary | ICD-10-CM | POA: Diagnosis not present

## 2018-07-16 DIAGNOSIS — I83019 Varicose veins of right lower extremity with ulcer of unspecified site: Secondary | ICD-10-CM | POA: Diagnosis not present

## 2018-07-16 DIAGNOSIS — I890 Lymphedema, not elsewhere classified: Secondary | ICD-10-CM | POA: Diagnosis not present

## 2018-07-24 DIAGNOSIS — E119 Type 2 diabetes mellitus without complications: Secondary | ICD-10-CM | POA: Diagnosis not present

## 2018-07-24 DIAGNOSIS — M1712 Unilateral primary osteoarthritis, left knee: Secondary | ICD-10-CM | POA: Diagnosis not present

## 2018-07-31 DIAGNOSIS — I890 Lymphedema, not elsewhere classified: Secondary | ICD-10-CM | POA: Diagnosis not present

## 2018-08-25 DIAGNOSIS — E119 Type 2 diabetes mellitus without complications: Secondary | ICD-10-CM | POA: Diagnosis not present

## 2018-08-25 DIAGNOSIS — D649 Anemia, unspecified: Secondary | ICD-10-CM | POA: Diagnosis not present

## 2018-08-25 DIAGNOSIS — R600 Localized edema: Secondary | ICD-10-CM | POA: Diagnosis not present

## 2018-08-25 DIAGNOSIS — E039 Hypothyroidism, unspecified: Secondary | ICD-10-CM | POA: Diagnosis not present

## 2018-08-25 DIAGNOSIS — I10 Essential (primary) hypertension: Secondary | ICD-10-CM | POA: Diagnosis not present

## 2018-08-28 DIAGNOSIS — D649 Anemia, unspecified: Secondary | ICD-10-CM | POA: Diagnosis not present

## 2018-08-28 DIAGNOSIS — E1165 Type 2 diabetes mellitus with hyperglycemia: Secondary | ICD-10-CM | POA: Diagnosis not present

## 2018-08-28 DIAGNOSIS — I10 Essential (primary) hypertension: Secondary | ICD-10-CM | POA: Diagnosis not present

## 2018-08-28 DIAGNOSIS — R3129 Other microscopic hematuria: Secondary | ICD-10-CM | POA: Diagnosis not present

## 2018-08-28 DIAGNOSIS — Z23 Encounter for immunization: Secondary | ICD-10-CM | POA: Diagnosis not present

## 2018-08-28 DIAGNOSIS — E039 Hypothyroidism, unspecified: Secondary | ICD-10-CM | POA: Diagnosis not present

## 2018-08-28 DIAGNOSIS — I890 Lymphedema, not elsewhere classified: Secondary | ICD-10-CM | POA: Diagnosis not present

## 2018-09-01 DIAGNOSIS — M159 Polyosteoarthritis, unspecified: Secondary | ICD-10-CM | POA: Diagnosis not present

## 2018-09-01 DIAGNOSIS — R600 Localized edema: Secondary | ICD-10-CM | POA: Diagnosis not present

## 2018-09-01 DIAGNOSIS — M1712 Unilateral primary osteoarthritis, left knee: Secondary | ICD-10-CM | POA: Diagnosis not present

## 2018-09-01 DIAGNOSIS — M1A00X0 Idiopathic chronic gout, unspecified site, without tophus (tophi): Secondary | ICD-10-CM | POA: Diagnosis not present

## 2018-09-08 ENCOUNTER — Encounter: Payer: Self-pay | Admitting: Urology

## 2018-09-08 ENCOUNTER — Ambulatory Visit: Payer: PPO | Admitting: Urology

## 2018-09-08 VITALS — BP 98/61 | HR 71 | Ht 59.0 in | Wt 141.6 lb

## 2018-09-08 DIAGNOSIS — R3129 Other microscopic hematuria: Secondary | ICD-10-CM | POA: Diagnosis not present

## 2018-09-08 LAB — URINALYSIS, COMPLETE
BILIRUBIN UA: NEGATIVE
Glucose, UA: NEGATIVE
Ketones, UA: NEGATIVE
Nitrite, UA: NEGATIVE
PH UA: 5 (ref 5.0–7.5)
PROTEIN UA: NEGATIVE
SPEC GRAV UA: 1.01 (ref 1.005–1.030)
Urobilinogen, Ur: 0.2 mg/dL (ref 0.2–1.0)

## 2018-09-08 LAB — MICROSCOPIC EXAMINATION

## 2018-09-08 NOTE — Progress Notes (Signed)
09/08/2018 4:05 PM   [name] Post Apr 02, 1941 [id number]  Referring provider: Tracie Harrier, MD [address] [facility] [place], [place] [address]  Chief Complaint  Patient presents with  . Hematuria    HPI: Patient is a 77 year old Caucasian female who presents today as a referral from Dr. Tracie Harrier.      Patient was found to have microscopic hematuria on 08/25/2018 with 4-10 RBC's/hpf.    She has a history of nephrolithiasis.  She has had ESWL several times.  Her stone composition is unknown.    She does not have a prior history of recurrent urinary tract infections, trauma to the genitourinary tract or malignancies of the genitourinary tract.   She does not have a family medical history of GU malignancies.    Today, she is having nocturia x 2.  Her UA today demonstrates 11-30 WBC's.  Patient denies any gross hematuria, dysuria or suprapubic/flank pain.  Patient denies any fevers, chills, nausea or vomiting.   She is not a smoker.  She has not been exposed to secondhand smoke.  She has not worked with Sports administrator, trichloroethylene, etc.      PMH: Past Medical History:  Diagnosis Date  . Arthritis   . Arthritis   . Breast cancer ([place]) 1992   right breast with lumpectomy and rad tx  . Cancer of right breast ([place]) 04/16/2013   right breast with mastectomy  . Diabetes mellitus without complication ([place])   . Gout   . High cholesterol   . Hyperlipidemia   . Hypertension   . Personal history of radiation therapy     Surgical History: Past Surgical History:  Procedure Laterality Date  . ABDOMINAL HYSTERECTOMY    . ANKLE FRACTURE SURGERY Right 2004  . BREAST LUMPECTOMY Right 1992   positive  . MASTECTOMY Right 2014  . SHOULDER SURGERY Right 2010    Home Medications:  Allergies as of 09/08/2018      Reactions   Other Anaphylaxis   Anesthisia has been a health issue for her in the past.    Sulfa Antibiotics Rash      Medication List        Accurate as of 09/08/18  4:05 PM. Always use your most recent med list.          acetaminophen 500 MG tablet Commonly known as:  TYLENOL Take 500 mg by mouth QID.   alendronate 70 MG tablet Commonly known as:  FOSAMAX Take 70 mg by mouth once a week.   allopurinol 300 MG tablet Commonly known as:  ZYLOPRIM Take 300 mg by mouth daily.   atenolol 50 MG tablet Commonly known as:  TENORMIN Take 50 mg by mouth daily.   atorvastatin 40 MG tablet Commonly known as:  LIPITOR Take 40 mg by mouth daily.   cephALEXin 500 MG capsule Commonly known as:  KEFLEX Take 1 capsule (500 mg total) by mouth 2 (two) times daily.   FERREX 150 150 MG capsule Generic drug:  iron polysaccharides Take 150 mg by mouth daily.   furosemide 40 MG tablet Commonly known as:  LASIX Take 40 mg by mouth daily.   furosemide 20 MG tablet Commonly known as:  LASIX Take 20 mg by mouth daily.   levothyroxine 75 MCG tablet Commonly known as:  SYNTHROID, LEVOTHROID Take 75 mcg by mouth daily.   lisinopril-hydrochlorothiazide 20-12.5 MG tablet Commonly known as:  PRINZIDE,ZESTORETIC Take 1 tablet by mouth daily.   metFORMIN 1000  MG tablet Commonly known as:  GLUCOPHAGE Take 1,000 mg by mouth daily with breakfast.   tamoxifen 20 MG tablet Commonly known as:  NOLVADEX TAKE 1 TABLET BY MOUTH ONCE DAILY.   vitamin C 1000 MG tablet Take 1,000 mg by mouth daily.   Vitamin D (Ergocalciferol) 50000 units Caps capsule Commonly known as:  DRISDOL Take 50,000 Units by mouth every 30 (thirty) days.       Allergies:  Allergies  Allergen Reactions  . Other Anaphylaxis    Anesthisia has been a health issue for her in the past.   . Sulfa Antibiotics Rash    Family History: Family History  Problem Relation Age of Onset  . Breast cancer Neg Hx     Social History:  reports that she has never smoked. She has never used smokeless tobacco. She reports that she does not drink  alcohol or use drugs.  ROS: UROLOGY Frequent Urination?: No Hard to postpone urination?: No Burning/pain with urination?: No Get up at night to urinate?: Yes Leakage of urine?: No Urine stream starts and stops?: No Trouble starting stream?: No Do you have to strain to urinate?: No Blood in urine?: Yes Urinary tract infection?: No Sexually transmitted disease?: No Injury to kidneys or bladder?: No Painful intercourse?: No Weak stream?: No Currently pregnant?: No Vaginal bleeding?: No Last menstrual period?: n  Gastrointestinal Nausea?: No Vomiting?: No Indigestion/heartburn?: No Diarrhea?: No Constipation?: No  Constitutional Fever: No Night sweats?: No Weight loss?: No Fatigue?: No  Skin Skin rash/lesions?: No Itching?: No  Eyes Blurred vision?: No Double vision?: No  Ears/Nose/Throat Sore throat?: No Sinus problems?: No  Hematologic/Lymphatic Swollen glands?: No Easy bruising?: No  Cardiovascular Leg swelling?: Yes Chest pain?: No  Respiratory Cough?: No Shortness of breath?: No  Endocrine Excessive thirst?: No  Musculoskeletal Back pain?: Yes Joint pain?: No  Neurological Headaches?: No Dizziness?: No  Psychologic Depression?: No Anxiety?: No  Physical Exam: BP 98/61 (BP Location: Left Arm, Patient Position: Sitting, Cuff Size: Normal)   Pulse 71   Ht 4\' 11"  (1.499 m)   Wt 141 lb 9.6 oz (64.2 kg)   BMI 28.60 kg/m   Constitutional:  Well nourished. Alert and oriented, No acute distress. HEENT: [place] AT, moist mucus membranes.  Trachea midline, no masses. Cardiovascular: No clubbing, cyanosis, or edema. Respiratory: Normal respiratory effort, no increased work of breathing. GI: Abdomen is soft, non tender, non distended, no abdominal masses.  GU: No CVA tenderness.  No bladder fullness or masses.   Skin: No rashes, bruises or suspicious lesions. Lymph: No cervical or inguinal adenopathy. Neurologic: Grossly intact, no focal  deficits, moving all 4 extremities. Psychiatric: Normal mood and affect.  Laboratory Data: Lab Results  Component Value Date   WBC 8.4 06/02/2018   HGB 11.3 (L) 06/02/2018   HCT 34.5 (L) 06/02/2018   MCV 94.3 06/02/2018   PLT 296 06/02/2018    Lab Results  Component Value Date   CREATININE 1.59 (H) 06/02/2018    No results found for: PSA  No results found for: TESTOSTERONE  No results found for: HGBA1C  No results found for: TSH  No results found for: CHOL, HDL, CHOLHDL, VLDL, LDLCALC  Lab Results  Component Value Date   AST 27 06/02/2018   Lab Results  Component Value Date   ALT 16 06/02/2018   No components found for: ALKALINEPHOPHATASE No components found for: BILIRUBINTOTAL  No results found for: ESTRADIOL   Urinalysis 11-30 WBC's.  See Epic.  I  have reviewed the labs.  Assessment & Plan:    1. Microscopic hematuria Explained to the patient that there are a number of causes that can be associated with blood in the urine, such as stones, UTIs, damage to the urinary tract and/or cancer. At this time, I felt that the patient warranted further urologic evaluation.   The AUA guidelines state that a CT urogram is the preferred imaging study to evaluate hematuria. I explained to the patient that a contrast material will be injected into a vein and that in rare instances, an allergic reaction can result and may even life threatening   The patient denies any allergies to contrast, iodine and/or seafood and is not taking metformin. Following the imaging study,  I've recommended a cystoscopy. I described how this is performed, typically in an office setting with a flexible cystoscope. We described the risks, benefits, and possible side effects, the most common of which is a minor amount of blood in the urine and/or burning which usually resolves in 24 to 48 hours.   The patient had the opportunity to ask questions which were answered. Based upon this discussion, the  patient is willing to proceed. Therefore, I've ordered: a CT Urogram and cystoscopy.  - The patient will return following all of the above for discussion of the results.  - Urinalysis, Complete - CULTURE, URINE COMPREHENSIVE - BUN+Creat   Return for CT Urogram report and cystoscopy.  These notes generated with voice recognition software. I apologize for typographical errors.  Zara Council, PA-C  [facility] [address] [place]  [place], [place] [address] [phone]

## 2018-09-09 ENCOUNTER — Ambulatory Visit (INDEPENDENT_AMBULATORY_CARE_PROVIDER_SITE_OTHER): Payer: PPO | Admitting: Vascular Surgery

## 2018-09-09 ENCOUNTER — Encounter (INDEPENDENT_AMBULATORY_CARE_PROVIDER_SITE_OTHER): Payer: Self-pay | Admitting: Vascular Surgery

## 2018-09-09 VITALS — BP 111/68 | HR 71 | Resp 18 | Ht 59.0 in | Wt 142.0 lb

## 2018-09-09 DIAGNOSIS — E119 Type 2 diabetes mellitus without complications: Secondary | ICD-10-CM | POA: Diagnosis not present

## 2018-09-09 DIAGNOSIS — I890 Lymphedema, not elsewhere classified: Secondary | ICD-10-CM

## 2018-09-09 DIAGNOSIS — E785 Hyperlipidemia, unspecified: Secondary | ICD-10-CM

## 2018-09-09 DIAGNOSIS — L97311 Non-pressure chronic ulcer of right ankle limited to breakdown of skin: Secondary | ICD-10-CM | POA: Diagnosis not present

## 2018-09-09 DIAGNOSIS — I10 Essential (primary) hypertension: Secondary | ICD-10-CM | POA: Diagnosis not present

## 2018-09-09 LAB — BUN+CREAT
BUN/Creatinine Ratio: 31 — ABNORMAL HIGH (ref 12–28)
BUN: 41 mg/dL — AB (ref 8–27)
Creatinine, Ser: 1.31 mg/dL — ABNORMAL HIGH (ref 0.57–1.00)
GFR calc non Af Amer: 40 mL/min/{1.73_m2} — ABNORMAL LOW (ref >59)
GFR, EST AFRICAN AMERICAN: 46 mL/min/{1.73_m2} — AB (ref >59)

## 2018-09-09 NOTE — Assessment & Plan Note (Signed)
We are going to use an Unna boot on the right leg today.  This will need to be changed weekly.  Then I will reassess this in 3 to 4 weeks.  3 layer Unna boot was placed on the right lower extremity today.

## 2018-09-09 NOTE — Progress Notes (Signed)
MRN : [id number]  [name] is a 77 y.o. ([birth year]) female who presents with chief complaint of  Chief Complaint  Patient presents with  . Follow-up    2 month Lymphedema pump discussion  .  History of Present Illness: Patient returns today in follow up of her leg swelling and lymphedema.  Her leg swelling is actually better with the use of the lymphedema pump for an hour a day and compression stockings.  Despite the improved swelling, she does have a small area of skin breakdown just below her right medial ankle.  No fevers or chills.  It is not that painful.  Current Outpatient Medications  Medication Sig Dispense Refill  . acetaminophen (TYLENOL) 500 MG tablet Take 500 mg by mouth QID.    Marland Kitchen alendronate (FOSAMAX) 70 MG tablet Take 70 mg by mouth once a week.     Marland Kitchen allopurinol (ZYLOPRIM) 300 MG tablet Take 300 mg by mouth daily.  11  . Ascorbic Acid (VITAMIN C) 1000 MG tablet Take 1,000 mg by mouth daily.    Marland Kitchen atenolol (TENORMIN) 50 MG tablet Take 50 mg by mouth daily.     Marland Kitchen atorvastatin (LIPITOR) 40 MG tablet Take 40 mg by mouth daily.     . cephALEXin (KEFLEX) 500 MG capsule Take 1 capsule (500 mg total) by mouth 2 (two) times daily. (Patient not taking: Reported on 07/08/2018) 14 capsule 0  . FERREX 150 150 MG capsule Take 150 mg by mouth daily.  5  . furosemide (LASIX) 20 MG tablet Take 20 mg by mouth daily.     . furosemide (LASIX) 40 MG tablet Take 40 mg by mouth daily.    Marland Kitchen levothyroxine (SYNTHROID, LEVOTHROID) 75 MCG tablet Take 75 mcg by mouth daily.     Marland Kitchen lisinopril-hydrochlorothiazide (PRINZIDE,ZESTORETIC) 20-12.5 MG tablet Take 1 tablet by mouth daily.     . metFORMIN (GLUCOPHAGE) 1000 MG tablet Take 1,000 mg by mouth daily with breakfast.     . tamoxifen (NOLVADEX) 20 MG tablet TAKE 1 TABLET BY MOUTH ONCE DAILY. 90 tablet 0  . Vitamin D, Ergocalciferol, (DRISDOL) 50000 UNITS CAPS capsule Take 50,000 Units by mouth every 30 (thirty) days.      No current  facility-administered medications for this visit.     Past Medical History:  Diagnosis Date  . Arthritis   . Arthritis   . Breast cancer ([place]) 1992   right breast with lumpectomy and rad tx  . Cancer of right breast ([place]) 04/16/2013   right breast with mastectomy  . Diabetes mellitus without complication ([place])   . Gout   . High cholesterol   . Hyperlipidemia   . Hypertension   . Personal history of radiation therapy     Past Surgical History:  Procedure Laterality Date  . ABDOMINAL HYSTERECTOMY    . ANKLE FRACTURE SURGERY Right 2004  . BREAST LUMPECTOMY Right 1992   positive  . MASTECTOMY Right 2014  . SHOULDER SURGERY Right 2010    Social History        Tobacco Use  . Smoking status: Never Smoker  . Smokeless tobacco: Never Used  Substance Use Topics  . Alcohol use: No    Alcohol/week: 0.0 standard drinks  . Drug use: No      Family History      Family History  Problem Relation Age of Onset  . Breast cancer Neg Hx   no bleeding disorders, clotting disorders, autoimmune diseases or aneurysms  Allergies  Allergen Reactions  . Other Anaphylaxis    Anesthisia has been a health issue for her in the past.   . Sulfa Antibiotics Rash     REVIEW OF SYSTEMS (Negative unless checked)  Constitutional: [] Weight loss  [] Fever  [] Chills Cardiac: [] Chest pain   [] Chest pressure   [] Palpitations   [] Shortness of breath when laying flat   [] Shortness of breath at rest   [x] Shortness of breath with exertion. Vascular:  [] Pain in legs with walking   [] Pain in legs at rest   [] Pain in legs when laying flat   [] Claudication   [] Pain in feet when walking  [] Pain in feet at rest  [] Pain in feet when laying flat   [] History of DVT   [] Phlebitis   [x] Swelling in legs   [] Varicose veins   [] Non-healing ulcers Pulmonary:   [] Uses home oxygen   [] Productive cough   [] Hemoptysis   [] Wheeze  [] COPD   [] Asthma Neurologic:  [] Dizziness  [] Blackouts   [] Seizures    [] History of stroke   [] History of TIA  [] Aphasia   [] Temporary blindness   [] Dysphagia   [] Weakness or numbness in arms   [] Weakness or numbness in legs Musculoskeletal:  [x] Arthritis   [] Joint swelling   [x] Joint pain   [x] Low back pain Hematologic:  [] Easy bruising  [] Easy bleeding   [] Hypercoagulable state   [] Anemic   Gastrointestinal:  [] Blood in stool   [] Vomiting blood  [] Gastroesophageal reflux/heartburn   [] Abdominal pain Genitourinary:  [] Chronic kidney disease   [] Difficult urination  [] Frequent urination  [] Burning with urination   [] Hematuria Skin:  [] Rashes   [x] Ulcers   [x] Wounds Psychological:  [] History of anxiety   []  History of major depression.    Physical Examination  BP 111/68 (BP Location: Left Arm, Patient Position: Sitting)   Pulse 71   Resp 18   Ht 4\' 11"  (1.499 m)   Wt 142 lb (64.4 kg)   BMI 28.68 kg/m  Gen:  WD/WN, NAD Head: [place]/AT, No temporalis wasting. Ear/Nose/Throat: Hearing grossly intact, nares w/o erythema or drainage Eyes: Conjunctiva clear. Sclera non-icteric Neck: Supple.  Trachea midline Pulmonary:  Good air movement, no use of accessory muscles.  Cardiac: RRR, no JVD Vascular:  Vessel Right Left  Radial Palpable Palpable                          PT  trace palpable  trace palpable  DP  1+ palpable  1+ palpable    Musculoskeletal: M/S 5/5 throughout.  No deformity or atrophy.  1+ bilateral lower extremity edema. Neurologic: Sensation grossly intact in extremities.  Symmetrical.  Speech is fluent.  Psychiatric: Judgment intact, Mood & affect appropriate for pt's clinical situation. Dermatologic: Mild superficial ulceration on the right medial ankle area.       Labs Recent Results (from the past 2160 hour(s))  Urinalysis, Complete     Status: Abnormal   Collection Time: 09/08/18  3:19 PM  Result Value Ref Range   Specific Gravity, UA 1.010 1.005 - 1.030   pH, UA 5.0 5.0 - 7.5   Color, UA Yellow Yellow   Appearance Ur  Cloudy (A) Clear   Leukocytes, UA 2+ (A) Negative   Protein, UA Negative Negative/Trace   Glucose, UA Negative Negative   Ketones, UA Negative Negative   RBC, UA 1+ (A) Negative   Bilirubin, UA Negative Negative   Urobilinogen, Ur 0.2 0.2 - 1.0 mg/dL   Nitrite, UA  Negative Negative   Microscopic Examination See below:   Microscopic Examination     Status: Abnormal   Collection Time: 09/08/18  3:19 PM  Result Value Ref Range   WBC, UA 11-30 (A) 0 - 5 /hpf   RBC, UA 0-2 0 - 2 /hpf   Epithelial Cells (non renal) 0-10 0 - 10 /hpf   Renal Epithel, UA 0-10 (A) None seen /hpf   Casts Present (A) None seen /lpf   Cast Type Hyaline casts N/A   Bacteria, UA Few (A) None seen/Few   Yeast, UA Present (A) None seen  BUN+Creat     Status: Abnormal   Collection Time: 09/08/18  3:43 PM  Result Value Ref Range   BUN 41 (H) 8 - 27 mg/dL   Creatinine, Ser 1.31 (H) 0.57 - 1.00 mg/dL   GFR calc non Af Amer 40 (L) >59 mL/min/1.73   GFR calc Af Amer 46 (L) >59 mL/min/1.73   BUN/Creatinine Ratio 31 (H) 12 - 28    Radiology No results found.  Assessment/Plan Diabetes mellitus, type 2 blood glucose control important in reducing the progression of atherosclerotic disease. Also, involved in wound healing. On appropriate medications.   Hypertension blood pressure control important in reducing the progression of atherosclerotic disease. On appropriate oral medications.   HLD (hyperlipidemia) lipid control important in reducing the progression of atherosclerotic disease. Continue statin therapy  Lower limb ulcer, ankle, right, limited to breakdown of skin (HCC) We are going to use an Unna boot on the right leg today.  This will need to be changed weekly.  Then I will reassess this in 3 to 4 weeks.  3 layer Unna boot was placed on the right lower extremity today.  Lymphedema The lymphedema pump and compression have actually improved her swelling.  She does have an area of skin breakdown on  the right medial ankle's were going to use Unna boots today.    [name], MD  09/09/2018 2:31 PM    This note was created with Dragon medical transcription system.  Any errors from dictation are purely unintentional

## 2018-09-09 NOTE — Assessment & Plan Note (Signed)
The lymphedema pump and compression have actually improved her swelling.  She does have an area of skin breakdown on the right medial ankle's were going to use Unna boots today.

## 2018-09-10 LAB — CULTURE, URINE COMPREHENSIVE

## 2018-09-11 ENCOUNTER — Telehealth: Payer: Self-pay | Admitting: Urology

## 2018-09-11 NOTE — Telephone Encounter (Signed)
Pt called asking for results of her labs and urine, and if medication is needed to please call in at the [facility]. Please advise. Thanks.

## 2018-09-16 ENCOUNTER — Encounter (INDEPENDENT_AMBULATORY_CARE_PROVIDER_SITE_OTHER): Payer: Self-pay

## 2018-09-16 ENCOUNTER — Ambulatory Visit (INDEPENDENT_AMBULATORY_CARE_PROVIDER_SITE_OTHER): Payer: PPO | Admitting: Nurse Practitioner

## 2018-09-16 VITALS — BP 95/57 | HR 78 | Resp 17 | Ht 61.0 in | Wt 142.0 lb

## 2018-09-16 DIAGNOSIS — L97311 Non-pressure chronic ulcer of right ankle limited to breakdown of skin: Secondary | ICD-10-CM | POA: Diagnosis not present

## 2018-09-16 NOTE — Progress Notes (Signed)
History of Present Illness  There is no documented history at this time  Assessments & Plan   There are no diagnoses linked to this encounter.    Additional instructions  Subjective:  Patient presents with venous ulcer of the Right lower extremity.    Procedure:  3 layer unna wrap was placed Right lower extremity.   Plan:   Follow up in one week.   

## 2018-09-18 ENCOUNTER — Encounter (INDEPENDENT_AMBULATORY_CARE_PROVIDER_SITE_OTHER): Payer: Self-pay | Admitting: Nurse Practitioner

## 2018-09-23 ENCOUNTER — Encounter (INDEPENDENT_AMBULATORY_CARE_PROVIDER_SITE_OTHER): Payer: PPO

## 2018-09-23 DIAGNOSIS — M1712 Unilateral primary osteoarthritis, left knee: Secondary | ICD-10-CM | POA: Diagnosis not present

## 2018-09-24 ENCOUNTER — Encounter (INDEPENDENT_AMBULATORY_CARE_PROVIDER_SITE_OTHER): Payer: PPO

## 2018-09-26 ENCOUNTER — Ambulatory Visit: Payer: PPO

## 2018-09-26 ENCOUNTER — Ambulatory Visit
Admission: RE | Admit: 2018-09-26 | Discharge: 2018-09-26 | Disposition: A | Discharge status: Home / Self Care | Payer: PPO | Source: Ambulatory Visit | Attending: Urology | Admitting: Urology

## 2018-09-26 DIAGNOSIS — R3129 Other microscopic hematuria: Secondary | ICD-10-CM | POA: Insufficient documentation

## 2018-09-26 DIAGNOSIS — N8110 Cystocele, unspecified: Secondary | ICD-10-CM | POA: Insufficient documentation

## 2018-09-30 ENCOUNTER — Encounter (INDEPENDENT_AMBULATORY_CARE_PROVIDER_SITE_OTHER): Payer: PPO

## 2018-10-06 IMAGING — US US EXTREM LOW VENOUS BILAT
3 series · 14 of 24 positions shown · non-contrast
Comparison: None.

CLINICAL DATA: Bilateral leg edema for 2 months

EXAM:
BILATERAL LOWER EXTREMITY VENOUS DUPLEX ULTRASOUND
TECHNIQUE: Doppler venous assessment of the bilateral lower extremity deep
venous system was performed, including characterization of spectral
flow, compressibility, and phasicity.

[Series 1: us extrem low venous bilat · 12 of 58 slices shown (1 of 3)]
[im 1/58]
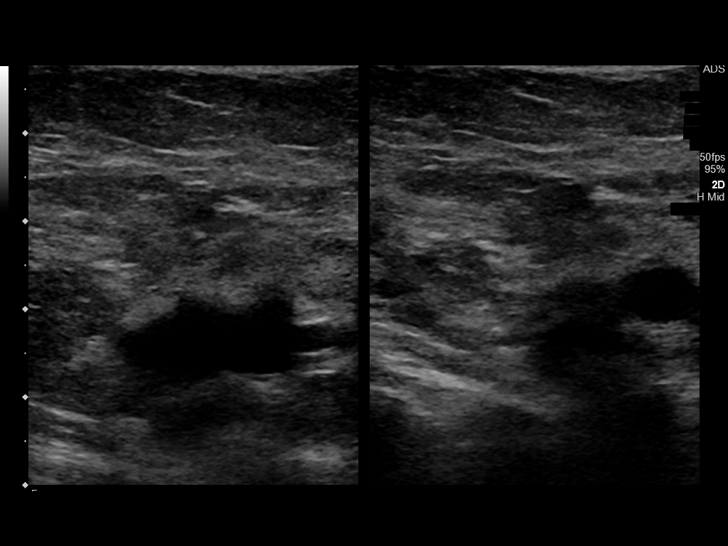
[im 6/58]
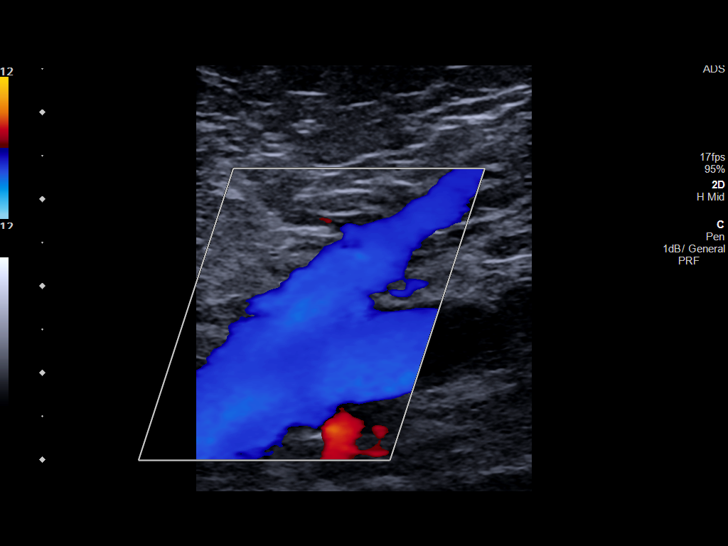
[im 12/58]
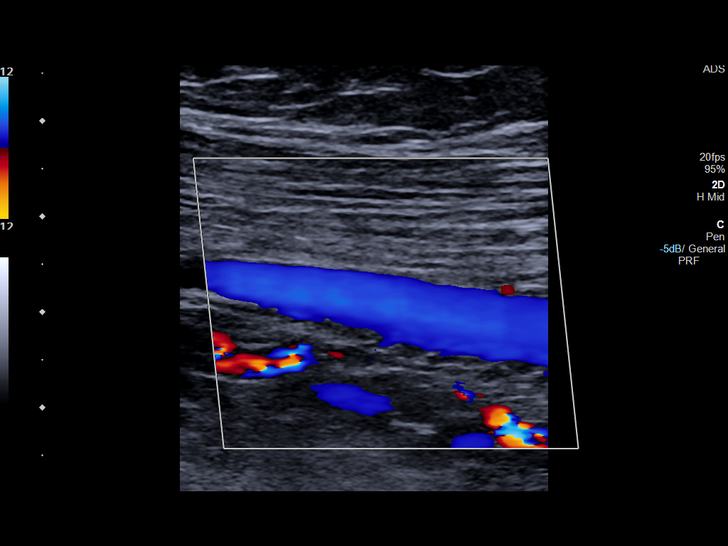
[im 18/58]
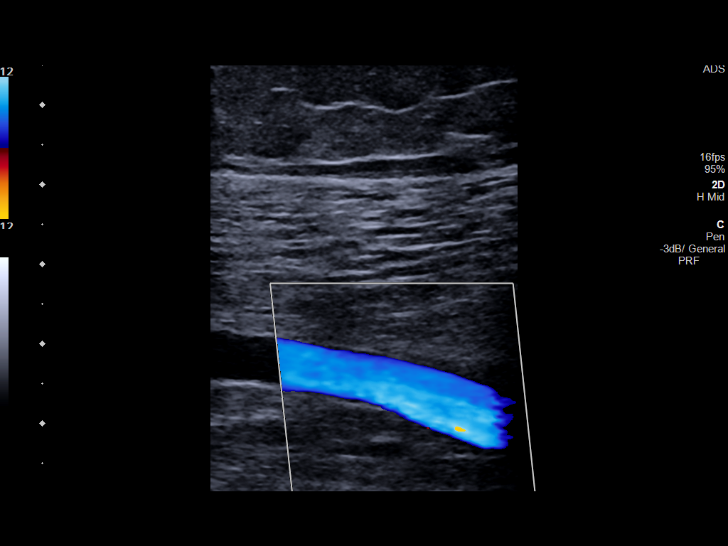
[im 20/58]
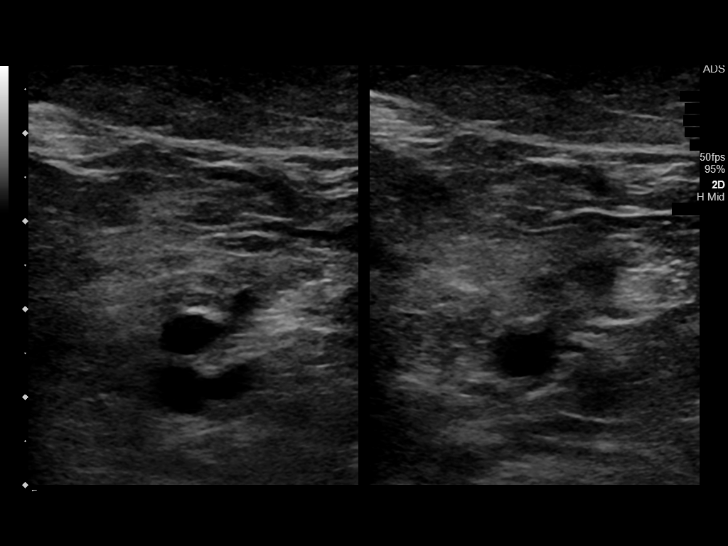
[im 26/58]
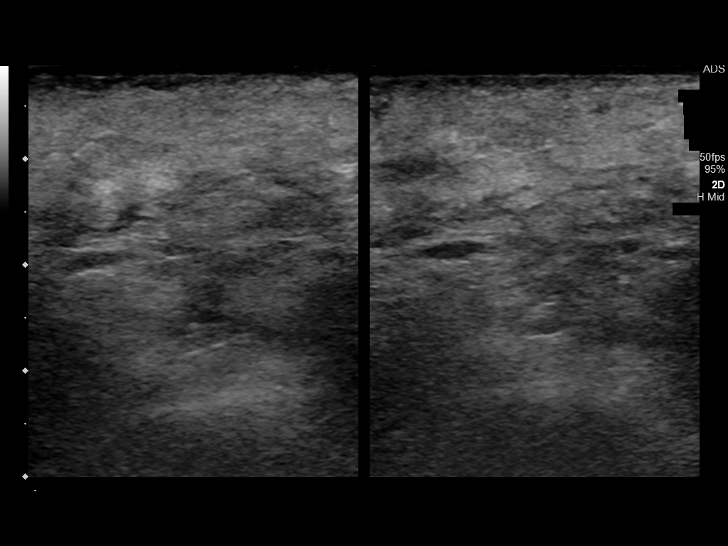
[im 32/58]
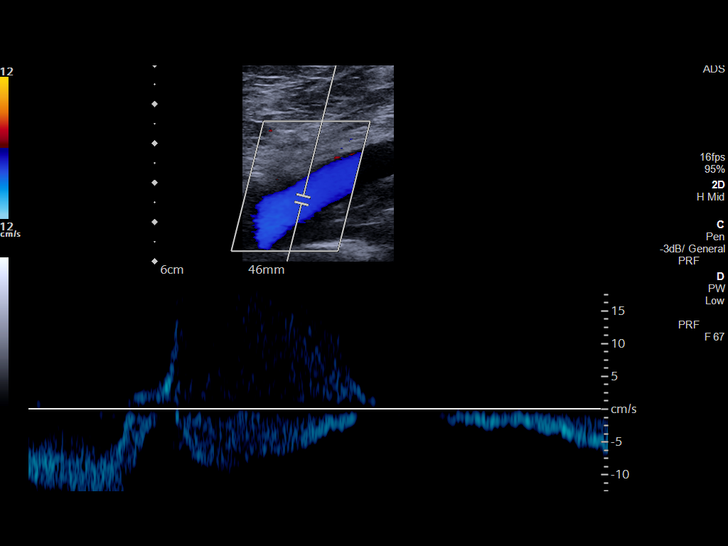
[im 35/58]
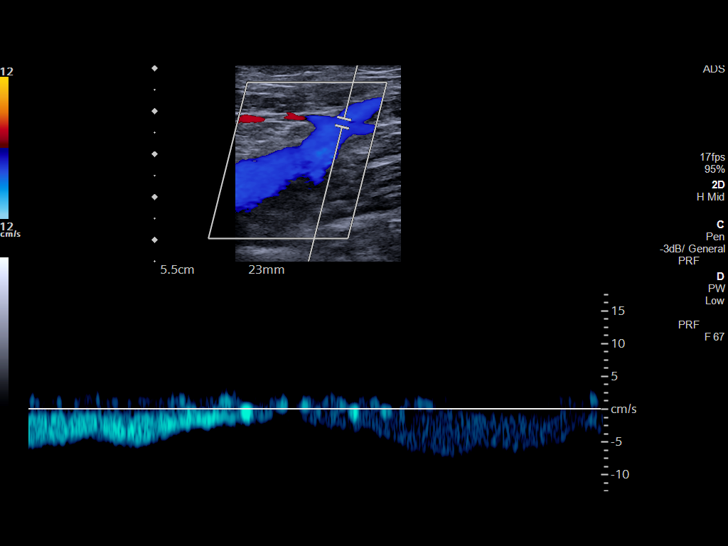
[im 40/58]
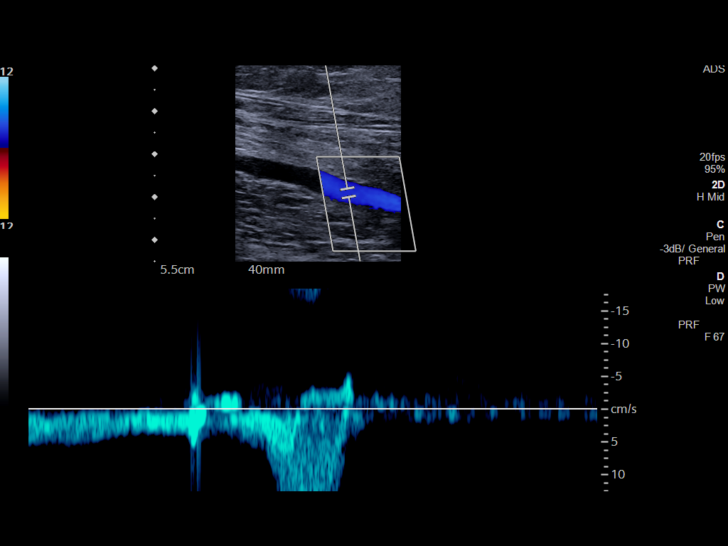
[im 46/58]
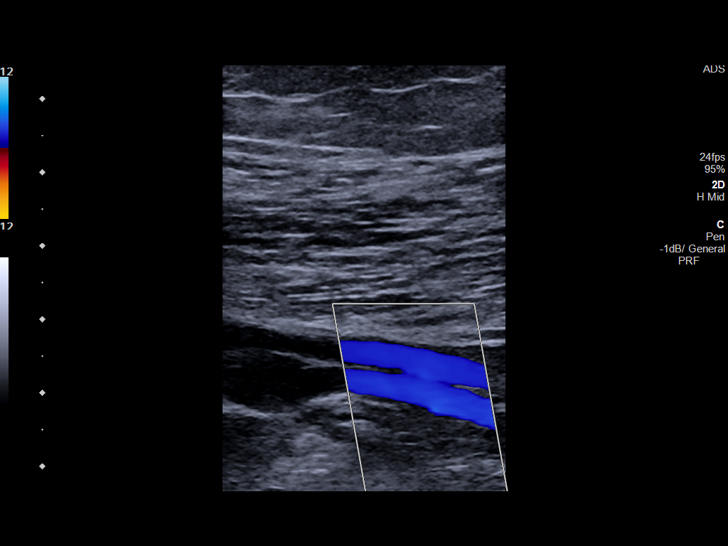
[im 52/58]
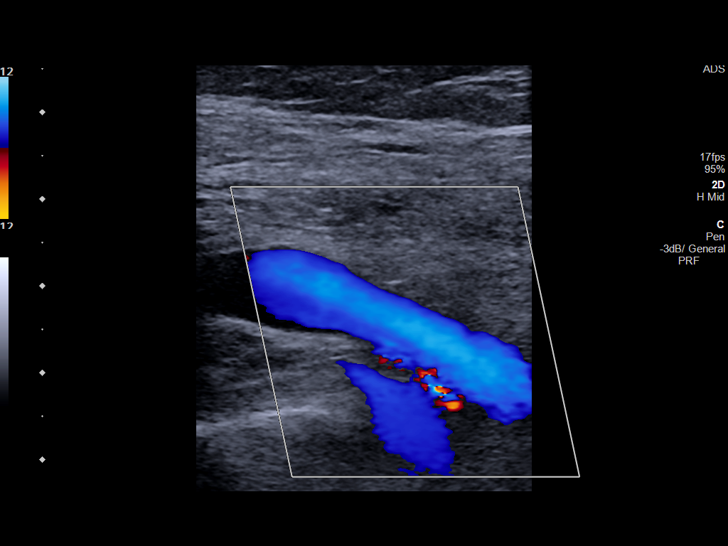
[im 55/58]
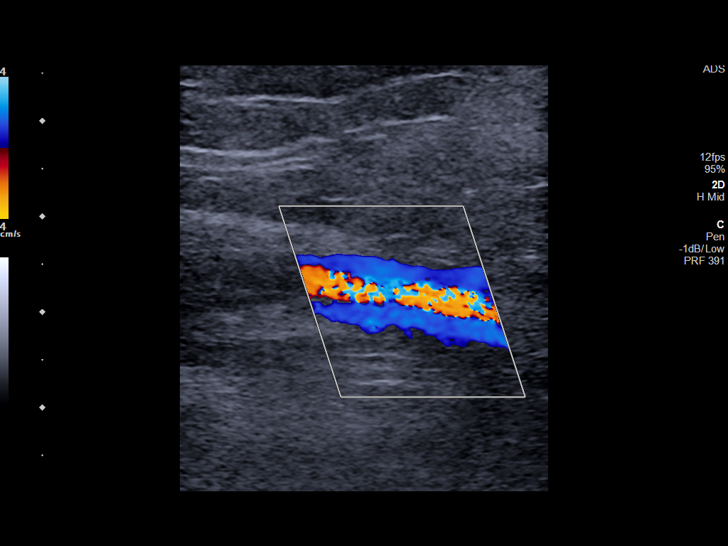

[Series 2: us extrem low venous bilat · 1 of 2 slices shown (2 of 3)]
[im 1/2]
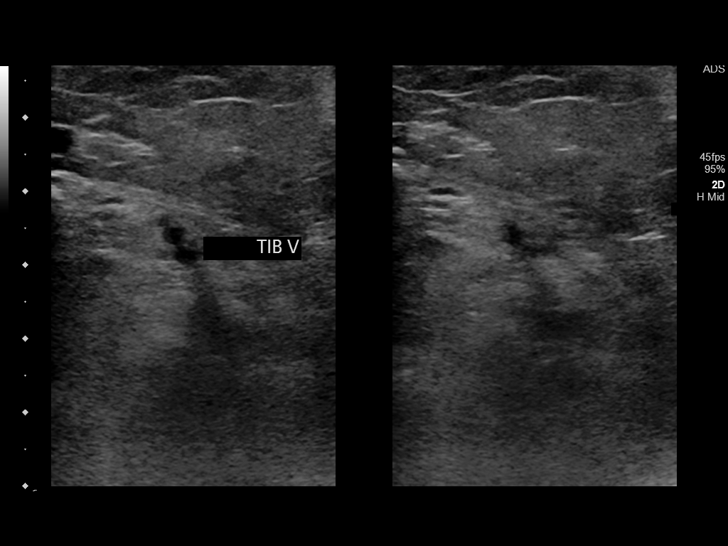

[Series 2: us extrem low venous bilat · 1 of 5 slices shown (3 of 3)]
[im 5/5]
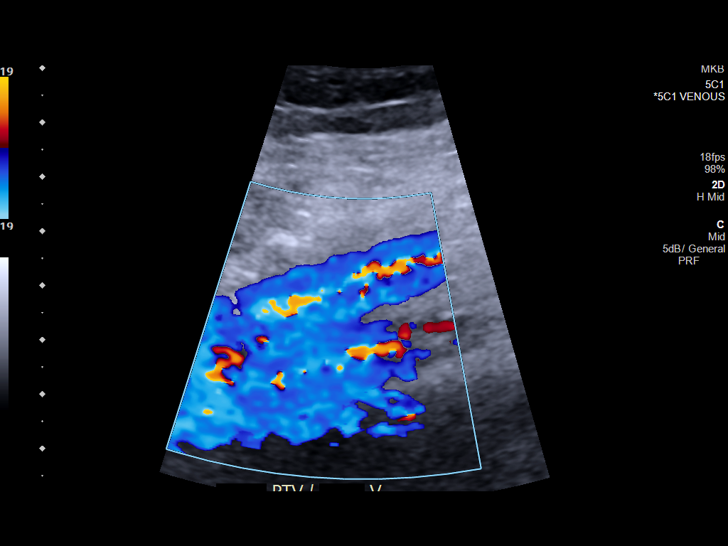

[14 of 24 positions shown; findings below may reference images not displayed]

FINDINGS: There is complete compressibility of the bilateral common femoral,
femoral, and popliteal veins. Doppler analysis demonstrates
respiratory phasicity and augmentation of flow with calf
compression. No obvious superficial vein or calf vein thrombosis.
IMPRESSION: No evidence of lower extremity DVT.

## 2018-10-07 ENCOUNTER — Ambulatory Visit (INDEPENDENT_AMBULATORY_CARE_PROVIDER_SITE_OTHER): Payer: PPO | Admitting: Vascular Surgery

## 2018-10-14 ENCOUNTER — Ambulatory Visit (INDEPENDENT_AMBULATORY_CARE_PROVIDER_SITE_OTHER): Payer: PPO | Admitting: Vascular Surgery

## 2018-10-16 ENCOUNTER — Other Ambulatory Visit: Payer: Self-pay

## 2018-10-16 DIAGNOSIS — R3129 Other microscopic hematuria: Secondary | ICD-10-CM

## 2018-10-17 ENCOUNTER — Other Ambulatory Visit: Payer: Self-pay

## 2018-10-17 ENCOUNTER — Ambulatory Visit (INDEPENDENT_AMBULATORY_CARE_PROVIDER_SITE_OTHER): Payer: PPO | Admitting: Urology

## 2018-10-17 ENCOUNTER — Ambulatory Visit
Admission: RE | Admit: 2018-10-17 | Discharge: 2018-10-17 | Disposition: A | Discharge status: Home / Self Care | Payer: PPO | Attending: Urology | Admitting: Urology

## 2018-10-17 DIAGNOSIS — R3129 Other microscopic hematuria: Secondary | ICD-10-CM | POA: Diagnosis not present

## 2018-10-17 DIAGNOSIS — Z01812 Encounter for preprocedural laboratory examination: Secondary | ICD-10-CM | POA: Diagnosis not present

## 2018-10-17 DIAGNOSIS — N8111 Cystocele, midline: Secondary | ICD-10-CM

## 2018-10-17 LAB — URINALYSIS, COMPLETE (UACMP) WITH MICROSCOPIC
Bilirubin Urine: NEGATIVE
Glucose, UA: NEGATIVE mg/dL
Hgb urine dipstick: NEGATIVE
KETONES UR: NEGATIVE mg/dL
Nitrite: NEGATIVE
PROTEIN: NEGATIVE mg/dL
Specific Gravity, Urine: 1.01 (ref 1.005–1.030)
pH: 5.5 (ref 5.0–8.0)

## 2018-10-17 NOTE — Progress Notes (Signed)
   10/17/18  CC:  Chief Complaint  Patient presents with  . Procedure    cystoscopy    HPI: 76 year old female with a personal history of microscopic hematuria who presents today for further evaluation with cystoscopy.  Since last visit, she underwent CT abdomen pelvis without contrast on 09/26/2018 which shows no GU pathology.  Incidentally, she does have a small cystocele.  She reports that the CT scan was scheduled with contrast, however, her creatinine was noted to be somewhat elevated on the day of the procedure and they ended up doing the study without contrast material as a precaution.  NED. A&Ox3.   No respiratory distress   Abd soft, NT, ND Normal external genitalia with patent urethral meatus with small caruncle, slight atrophy appreciated Grade 2 cystocele  Cystoscopy Procedure Note  Patient identification was confirmed, informed consent was obtained, and patient was prepped using Betadine solution.  Lidocaine jelly was administered per urethral meatus.    Procedure: - Flexible cystoscope introduced, without any difficulty.   - Thorough search of the bladder revealed:    normal urethral meatus    normal urothelium    no stones    no ulcers     no tumors    no urethral polyps    no trabeculation Some descent of the anterior bladder wall appreciated, digitally reduced with finger to fully evaluate mucosa  - Ureteral orifices were normal in position and appearance.    Post-Procedure: - Patient tolerated the procedure well  Assessment/ Plan:  1. Microscopic hematuria Status post negative cystoscopy today other than incidental cystocele and benign urethral caruncle We discussed today at length that the CT scan was done without contrast this there is a small chance of upper tract urothelial malignancy that was missed as a result She is a non-smoker and the risk of upper tract urothelial cancer at baseline is quite low We discussed the alternative for the most  comprehensive evaluation including proceeding to the operating room for retrograde pyelogram versus contrasted study with hydration and reduced dose of contrast This point time, she is not interested in pursuing further evaluation and understands the risk/benefits of this decision  2. Cystocele, midline [place] asymptomatic cystocele No further treatment needed if asymptomatic  Follow-up as needed  Hollice Espy, MD

## 2018-10-18 LAB — URINE CULTURE: Culture: <10000 — AB

## 2018-10-28 DIAGNOSIS — M79675 Pain in left toe(s): Secondary | ICD-10-CM | POA: Diagnosis not present

## 2018-10-28 DIAGNOSIS — M19171 Post-traumatic osteoarthritis, right ankle and foot: Secondary | ICD-10-CM | POA: Diagnosis not present

## 2018-10-28 DIAGNOSIS — M79674 Pain in right toe(s): Secondary | ICD-10-CM | POA: Diagnosis not present

## 2018-10-28 DIAGNOSIS — B351 Tinea unguium: Secondary | ICD-10-CM | POA: Diagnosis not present

## 2018-10-28 DIAGNOSIS — I872 Venous insufficiency (chronic) (peripheral): Secondary | ICD-10-CM | POA: Diagnosis not present

## 2018-11-21 ENCOUNTER — Other Ambulatory Visit: Payer: Self-pay | Admitting: Surgery

## 2018-11-21 DIAGNOSIS — Z1231 Encounter for screening mammogram for malignant neoplasm of breast: Secondary | ICD-10-CM

## 2018-12-15 ENCOUNTER — Other Ambulatory Visit: Payer: Self-pay | Admitting: Oncology

## 2018-12-16 ENCOUNTER — Telehealth: Payer: Self-pay | Admitting: *Deleted

## 2018-12-16 ENCOUNTER — Telehealth: Payer: Self-pay | Admitting: Oncology

## 2018-12-16 DIAGNOSIS — Z170 Estrogen receptor positive status [ER+]: Principal | ICD-10-CM

## 2018-12-16 DIAGNOSIS — C50411 Malignant neoplasm of upper-outer quadrant of right female breast: Secondary | ICD-10-CM

## 2018-12-16 NOTE — Telephone Encounter (Signed)
Patient has no follow up appointment since last visit 06/19/18   Assessment and plan- Patient is a 78 y.o. female history ofStage Iright breast cancer in 2014 status post mastectomy and currently on tamoxifen for hormone therapy. She is here for ongoing evaluation of her lower extremity edema  B/l leg edema- [place] LE showed no DVT. US abdomen showed no inguinal adenopathy. She has been treated with 2 courses of antibiotics in the recent past. Redness gets better but swelling has persisted. I will prescribe 10 days of keflex 500 mg Bid for possible superimposed cellulitis. Cause of her lower extremity edema is still unclear. I doubt this is due to tamoxifen. She has been on it for few years now and has not had any issues with it. I have nevertheless asked her to stop taking it for 3 weeks. I will obtan ECHo to rule out cardiac causes for leg edema. Also refer her to nephrology given that her creatinine is rising slowly as well whether that has anything to do with her leg edema. I have suggested that if above measures dont reveal a cause, she needs to discuss further with Dr. [name] and possibly see Dr. Lucky Cowboy who she has seen in the past. I will keep Dr. [name] informed on this as well  I will see her back in 3 months- cbc/cmp

## 2018-12-16 NOTE — Telephone Encounter (Signed)
[name] brought it to the attn of MD that patient has not had a recent f/u. She needed refill of her breast cancer pill. I called patient and got her voicemail and left her  A message stating we have not seen her in a while and would like to make an appt for 2/10 at 9 am in [place] office. Patient had been seen by dr Janese Banks in the past in [place] office but as of January of this year were no longer going to the [place] office were only located in the [place] office.  I told the patient that we are attached to the [facility] hospital and we are in the back of it and there is signs that say to the cancer center.  I also asked her to call me back and that way I could speak to her make sure she is okay with this appointment

## 2018-12-16 NOTE — Telephone Encounter (Signed)
Patient called back to say that she cannot come that early on February 10.  I told her she could come at 1030 for labs done 11:00 to see the doctor patient is agreeable and she knows is in [place] at [facility]. She knows where the cancer center is. She wanted to make sure she would keep dr. Janese [name] and I told her yes.

## 2018-12-16 NOTE — Telephone Encounter (Signed)
Lab + F/U w Dr. Janese Banks, per Brenda/Rx Response Msg. (Sherry L/M on V/M). MF

## 2018-12-22 ENCOUNTER — Encounter: Payer: Self-pay | Admitting: Oncology

## 2018-12-22 ENCOUNTER — Inpatient Hospital Stay (HOSPITAL_BASED_OUTPATIENT_CLINIC_OR_DEPARTMENT_OTHER): Payer: PPO | Admitting: Oncology

## 2018-12-22 ENCOUNTER — Ambulatory Visit: Payer: PPO | Admitting: Oncology

## 2018-12-22 ENCOUNTER — Other Ambulatory Visit: Payer: PPO

## 2018-12-22 ENCOUNTER — Inpatient Hospital Stay: Payer: PPO | Attending: Oncology

## 2018-12-22 ENCOUNTER — Other Ambulatory Visit: Payer: Self-pay

## 2018-12-22 VITALS — BP 122/71 | HR 81 | Temp 96.8°F | Resp 18 | Wt 146.9 lb

## 2018-12-22 DIAGNOSIS — Z170 Estrogen receptor positive status [ER+]: Secondary | ICD-10-CM | POA: Diagnosis not present

## 2018-12-22 DIAGNOSIS — E119 Type 2 diabetes mellitus without complications: Secondary | ICD-10-CM | POA: Insufficient documentation

## 2018-12-22 DIAGNOSIS — Z90710 Acquired absence of both cervix and uterus: Secondary | ICD-10-CM | POA: Diagnosis not present

## 2018-12-22 DIAGNOSIS — I10 Essential (primary) hypertension: Secondary | ICD-10-CM | POA: Diagnosis not present

## 2018-12-22 DIAGNOSIS — Z7984 Long term (current) use of oral hypoglycemic drugs: Secondary | ICD-10-CM | POA: Diagnosis not present

## 2018-12-22 DIAGNOSIS — Z08 Encounter for follow-up examination after completed treatment for malignant neoplasm: Secondary | ICD-10-CM

## 2018-12-22 DIAGNOSIS — Z79810 Long term (current) use of selective estrogen receptor modulators (SERMs): Secondary | ICD-10-CM

## 2018-12-22 DIAGNOSIS — C50911 Malignant neoplasm of unspecified site of right female breast: Secondary | ICD-10-CM | POA: Insufficient documentation

## 2018-12-22 DIAGNOSIS — Z923 Personal history of irradiation: Secondary | ICD-10-CM | POA: Insufficient documentation

## 2018-12-22 DIAGNOSIS — C50411 Malignant neoplasm of upper-outer quadrant of right female breast: Secondary | ICD-10-CM

## 2018-12-22 DIAGNOSIS — Z9011 Acquired absence of right breast and nipple: Secondary | ICD-10-CM

## 2018-12-22 DIAGNOSIS — Z79899 Other long term (current) drug therapy: Secondary | ICD-10-CM

## 2018-12-22 DIAGNOSIS — Z853 Personal history of malignant neoplasm of breast: Secondary | ICD-10-CM

## 2018-12-22 LAB — CBC WITH DIFFERENTIAL/PLATELET
Abs Immature Granulocytes: 0.1 10*3/uL — ABNORMAL HIGH (ref 0.00–0.07)
Basophils Absolute: 0.1 10*3/uL (ref 0.0–0.1)
Basophils Relative: 1 %
Eosinophils Absolute: 0.1 10*3/uL (ref 0.0–0.5)
Eosinophils Relative: 1 %
HCT: 33.3 % — ABNORMAL LOW (ref 36.0–46.0)
Hemoglobin: 10.5 g/dL — ABNORMAL LOW (ref 12.0–15.0)
Immature Granulocytes: 1 %
Lymphocytes Relative: 16 %
Lymphs Abs: 2 10*3/uL (ref 0.7–4.0)
MCH: 30.2 pg (ref 26.0–34.0)
MCHC: 31.5 g/dL (ref 30.0–36.0)
MCV: 95.7 fL (ref 80.0–100.0)
Monocytes Absolute: 0.7 10*3/uL (ref 0.1–1.0)
Monocytes Relative: 5 %
Neutro Abs: 9.8 10*3/uL — ABNORMAL HIGH (ref 1.7–7.7)
Neutrophils Relative %: 76 %
Platelets: 212 10*3/uL (ref 150–400)
RBC: 3.48 MIL/uL — AB (ref 3.87–5.11)
RDW: 16.1 % — ABNORMAL HIGH (ref 11.5–15.5)
WBC: 12.7 10*3/uL — ABNORMAL HIGH (ref 4.0–10.5)
nRBC: 0 % (ref 0.0–0.2)

## 2018-12-22 LAB — COMPREHENSIVE METABOLIC PANEL WITH GFR
ALT: 15 U/L (ref 0–44)
AST: 23 U/L (ref 15–41)
Albumin: 3.8 g/dL (ref 3.5–5.0)
Alkaline Phosphatase: 45 U/L (ref 38–126)
Anion gap: 10 (ref 5–15)
BUN: 44 mg/dL — ABNORMAL HIGH (ref 8–23)
CO2: 25 mmol/L (ref 22–32)
Calcium: 8.6 mg/dL — ABNORMAL LOW (ref 8.9–10.3)
Chloride: 103 mmol/L (ref 98–111)
Creatinine, Ser: 1.14 mg/dL — ABNORMAL HIGH (ref 0.44–1.00)
GFR calc Af Amer: 54 mL/min — ABNORMAL LOW
GFR calc non Af Amer: 47 mL/min — ABNORMAL LOW
Glucose, Bld: 154 mg/dL — ABNORMAL HIGH (ref 70–99)
Potassium: 3.4 mmol/L — ABNORMAL LOW (ref 3.5–5.1)
Sodium: 138 mmol/L (ref 135–145)
Total Bilirubin: 0.5 mg/dL (ref 0.3–1.2)
Total Protein: 7.1 g/dL (ref 6.5–8.1)

## 2018-12-22 NOTE — Progress Notes (Signed)
Hematology/Oncology Consult note [facility]  [phone] [phone]  Patient Care Team: Tracie Harrier, MD as PCP - General (Internal Medicine)   Name of the patient: [name]  [id number]  1941-08-10   Date of visit: 12/22/18  Diagnosis- rightbreast cancer stage I [id number] diagnosed in January 2014 ER/PR positive and HER-2/neu negative  Chief complaint/ Reason for visit-routine follow-up of breast cancer on tamoxifen  Heme/Onc history: Patient is a 78 year old female who was initially diagnosed with right breast cancer in 1992 status post lumpectomy and axillary lymph node dissection. She received adjuvant radiation therapy followed by 5 years of tamoxifen. In January 2014 she was diagnosed with second primary right breast cancer and underwent a mastectomy at that time. Oncotype score came back at low risk and patient did not require adjuvant chemotherapy. She could not tolerate AI and was started on tamoxifen for hormone therapy. She also has osteoporosis for which she takes vitamin D and Fosamax  Recent bone density scan from July 2018 revealed a T score of -1.8 in the left forearm, -1.5 with a left femoral neck. 10-year probability of a major hip fracture was 2.8% and that of a major osteoporotic fracture was 16%   Interval history-patient continues to have problems with bilateral lower extremity edema for which she is on compression stockings as well as using lymphedema pump at home being prescribed through vascular surgery.  She reports no new complaints at this time other than chronic fatigue.  ECOG PS- 2 Pain scale- 0 Opioid associated constipation- no  Review of systems- Review of Systems  Constitutional: Positive for malaise/fatigue. Negative for chills, fever and weight loss.  HENT: Negative for congestion, ear discharge and nosebleeds.   Eyes: Negative for blurred vision.  Respiratory: Negative for cough,  hemoptysis, sputum production, shortness of breath and wheezing.   Cardiovascular: Positive for leg swelling. Negative for chest pain, palpitations, orthopnea and claudication.  Gastrointestinal: Negative for abdominal pain, blood in stool, constipation, diarrhea, heartburn, melena, nausea and vomiting.  Genitourinary: Negative for dysuria, flank pain, frequency, hematuria and urgency.  Musculoskeletal: Negative for back pain, joint pain and myalgias.  Skin: Negative for rash.  Neurological: Negative for dizziness, tingling, focal weakness, seizures, weakness and headaches.  Endo/Heme/Allergies: Does not bruise/bleed easily.  Psychiatric/Behavioral: Negative for depression and suicidal ideas. The patient does not have insomnia.       Allergies  Allergen Reactions  . Other Anaphylaxis    Anesthisia has been a health issue for her in the past.   . Sulfa Antibiotics Rash     Past Medical History:  Diagnosis Date  . Arthritis   . Arthritis   . Breast cancer ([place]) 1992   right breast with lumpectomy and rad tx  . Cancer of right breast ([place]) 04/16/2013   right breast with mastectomy  . Diabetes mellitus without complication ([place])   . Gout   . High cholesterol   . Hyperlipidemia   . Hypertension   . Personal history of radiation therapy      Past Surgical History:  Procedure Laterality Date  . ABDOMINAL HYSTERECTOMY    . ANKLE FRACTURE SURGERY Right 2004  . BREAST LUMPECTOMY Right 1992   positive  . MASTECTOMY Right 2014  . SHOULDER SURGERY Right 2010    Social History   Socioeconomic History  . Marital status: Married    Spouse name: Not on file  . Number of children: 2  . Years of education: Not on file  .  Highest education level: Not on file  Occupational History  . Not on file  Social Needs  . Financial resource strain: Not on file  . Food insecurity:    Worry: Not on file    Inability: Not on file  . Transportation needs:    Medical: Not on file     Non-medical: Not on file  Tobacco Use  . Smoking status: Never Smoker  . Smokeless tobacco: Never Used  Substance and Sexual Activity  . Alcohol use: No    Alcohol/week: 0.0 standard drinks  . Drug use: No  . Sexual activity: Not on file  Lifestyle  . Physical activity:    Days per week: Not on file    Minutes per session: Not on file  . Stress: Not on file  Relationships  . Social connections:    Talks on phone: Not on file    Gets together: Not on file    Attends religious service: Not on file    Active member of club or organization: Not on file    Attends meetings of clubs or organizations: Not on file    Relationship status: Not on file  . Intimate partner violence:    Fear of current or ex partner: Not on file    Emotionally abused: Not on file    Physically abused: Not on file    Forced sexual activity: Not on file  Other Topics Concern  . Not on file  Social History Narrative  . Not on file    Family History  Problem Relation Age of Onset  . Breast cancer Neg Hx      Current Outpatient Medications:  .  acetaminophen (TYLENOL) 500 MG tablet, Take 500 mg by mouth QID., Disp: , Rfl:  .  alendronate (FOSAMAX) 70 MG tablet, Take 70 mg by mouth once a week. , Disp: , Rfl:  .  allopurinol (ZYLOPRIM) 300 MG tablet, Take 300 mg by mouth daily., Disp: , Rfl: 11 .  Ascorbic Acid (VITAMIN C) 1000 MG tablet, Take 1,000 mg by mouth daily., Disp: , Rfl:  .  atenolol (TENORMIN) 50 MG tablet, Take 50 mg by mouth daily. , Disp: , Rfl:  .  atorvastatin (LIPITOR) 40 MG tablet, Take 40 mg by mouth daily. , Disp: , Rfl:  .  FERREX 150 150 MG capsule, Take 150 mg by mouth daily., Disp: , Rfl: 5 .  furosemide (LASIX) 40 MG tablet, Take 40 mg by mouth daily., Disp: , Rfl:  .  levothyroxine (SYNTHROID, LEVOTHROID) 75 MCG tablet, Take 75 mcg by mouth daily. , Disp: , Rfl:  .  lisinopril-hydrochlorothiazide (PRINZIDE,ZESTORETIC) 20-12.5 MG tablet, Take 1 tablet by mouth daily. , Disp: ,  Rfl:  .  metFORMIN (GLUCOPHAGE) 1000 MG tablet, Take 1,000 mg by mouth daily with breakfast. , Disp: , Rfl:  .  tamoxifen (NOLVADEX) 20 MG tablet, TAKE 1 TABLET BY MOUTH ONCE DAILY., Disp: 90 tablet, Rfl: 0 .  Vitamin D, Ergocalciferol, (DRISDOL) 50000 UNITS CAPS capsule, Take 50,000 Units by mouth every 30 (thirty) days. , Disp: , Rfl:   Physical exam:  Vitals:   12/22/18 1112  BP: 122/71  Pulse: 81  Resp: 18  Temp: (!) 96.8 F (36 C)  TempSrc: Tympanic  Weight: 146 lb 14.4 oz (66.6 kg)   Physical Exam Constitutional:      Comments: Elderly frail woman in no acute distress.  She ambulates with a cane  HENT:     Head: Normocephalic and atraumatic.  Eyes:     Pupils: Pupils are equal, round, and reactive to light.  Neck:     Musculoskeletal: Normal range of motion.  Cardiovascular:     Rate and Rhythm: Normal rate and regular rhythm.     Heart sounds: Normal heart sounds.  Pulmonary:     Effort: Pulmonary effort is normal.     Breath sounds: Normal breath sounds.  Abdominal:     General: Bowel sounds are normal.     Palpations: Abdomen is soft.  Musculoskeletal:     Right lower leg: Edema (She is wearing compression stockings) present.     Left lower leg: Edema present.  Skin:    General: Skin is warm and dry.  Neurological:     Mental Status: She is alert and oriented to person, place, and time.      CMP Latest Ref Rng & Units 12/22/2018  Glucose 70 - 99 mg/dL 154(H)  BUN 8 - 23 mg/dL 44(H)  Creatinine 0.44 - 1.00 mg/dL 1.14(H)  Sodium 135 - 145 mmol/L 138  Potassium 3.5 - 5.1 mmol/L 3.4(L)  Chloride 98 - 111 mmol/L 103  CO2 22 - 32 mmol/L 25  Calcium 8.9 - 10.3 mg/dL 8.6(L)  Total Protein 6.5 - 8.1 g/dL 7.1  Total Bilirubin 0.3 - 1.2 mg/dL 0.5  Alkaline Phos 38 - 126 U/L 45  AST 15 - 41 U/L 23  ALT 0 - 44 U/L 15   CBC Latest Ref Rng & Units 12/22/2018  WBC 4.0 - 10.5 K/uL 12.7(H)  Hemoglobin 12.0 - 15.0 g/dL 10.5(L)  Hematocrit 36.0 - 46.0 % 33.3(L)    Platelets 150 - 400 K/uL 212      Assessment and plan- Patient is a 78 y.o. female history ofStage Iright breast cancer in 2014 status post mastectomy and currently on tamoxifen.  She is here for routine follow-up of breast cancer  Clinically patient is doing well.  We discussed stopping tamoxifen at this time since it has been 5 years since her second diagnosis of breast cancer in 2014.  Certainly given that this was a recurrent breast cancer after 1992 we could consider continuing tamoxifen for 5 more years.  However patient is old and frail and struggling with bilateral lower extremity edema and decreased ambulation.  We discussed the possible increased risk of DVT and uterine cancer associated with prolonged use of tamoxifen.  I think it would be reasonable for patient to stop her tamoxifen at this time as she has completed 5 years of treatment.  She has 3 more bottles left and she would like to use it up before stopping endocrine therapy.  I think that would be reasonable.  She will be getting a mammogram done in 2 days time.  I will see her back in 1 years time for routine physical exam   Visit Diagnosis 1. Long-term current use of tamoxifen   2. Encounter for follow-up surveillance of breast cancer      Dr. Randa Evens, MD, MPH [facility] [phone] 12/22/2018 1:06 PM

## 2018-12-22 NOTE — Progress Notes (Signed)
Here for follow up. Per pt " doing alright" stated fluid retention in legs continues w left leg being the worst.

## 2018-12-24 ENCOUNTER — Ambulatory Visit
Admission: RE | Admit: 2018-12-24 | Discharge: 2018-12-24 | Disposition: A | Discharge status: Home / Self Care | Payer: PPO | Source: Ambulatory Visit | Attending: Surgery | Admitting: Surgery

## 2018-12-24 DIAGNOSIS — Z1231 Encounter for screening mammogram for malignant neoplasm of breast: Secondary | ICD-10-CM | POA: Diagnosis not present

## 2018-12-26 ENCOUNTER — Ambulatory Visit (INDEPENDENT_AMBULATORY_CARE_PROVIDER_SITE_OTHER): Payer: PPO | Admitting: Vascular Surgery

## 2018-12-29 DIAGNOSIS — I10 Essential (primary) hypertension: Secondary | ICD-10-CM | POA: Diagnosis not present

## 2018-12-29 DIAGNOSIS — Z8739 Personal history of other diseases of the musculoskeletal system and connective tissue: Secondary | ICD-10-CM | POA: Diagnosis not present

## 2018-12-29 DIAGNOSIS — Z853 Personal history of malignant neoplasm of breast: Secondary | ICD-10-CM | POA: Diagnosis not present

## 2018-12-29 DIAGNOSIS — E785 Hyperlipidemia, unspecified: Secondary | ICD-10-CM | POA: Diagnosis not present

## 2018-12-29 DIAGNOSIS — M1712 Unilateral primary osteoarthritis, left knee: Secondary | ICD-10-CM | POA: Diagnosis not present

## 2018-12-29 DIAGNOSIS — D649 Anemia, unspecified: Secondary | ICD-10-CM | POA: Diagnosis not present

## 2018-12-29 DIAGNOSIS — Z87442 Personal history of urinary calculi: Secondary | ICD-10-CM | POA: Diagnosis not present

## 2018-12-29 DIAGNOSIS — I890 Lymphedema, not elsewhere classified: Secondary | ICD-10-CM | POA: Diagnosis not present

## 2018-12-29 DIAGNOSIS — Z0000 Encounter for general adult medical examination without abnormal findings: Secondary | ICD-10-CM | POA: Diagnosis not present

## 2018-12-29 DIAGNOSIS — E1165 Type 2 diabetes mellitus with hyperglycemia: Secondary | ICD-10-CM | POA: Diagnosis not present

## 2018-12-30 DIAGNOSIS — E785 Hyperlipidemia, unspecified: Secondary | ICD-10-CM | POA: Diagnosis not present

## 2018-12-30 DIAGNOSIS — E1165 Type 2 diabetes mellitus with hyperglycemia: Secondary | ICD-10-CM | POA: Diagnosis not present

## 2018-12-30 DIAGNOSIS — I10 Essential (primary) hypertension: Secondary | ICD-10-CM | POA: Diagnosis not present

## 2018-12-30 DIAGNOSIS — R8290 Unspecified abnormal findings in urine: Secondary | ICD-10-CM | POA: Diagnosis not present

## 2018-12-30 DIAGNOSIS — Z853 Personal history of malignant neoplasm of breast: Secondary | ICD-10-CM | POA: Diagnosis not present

## 2018-12-30 DIAGNOSIS — Z87442 Personal history of urinary calculi: Secondary | ICD-10-CM | POA: Diagnosis not present

## 2018-12-30 DIAGNOSIS — I890 Lymphedema, not elsewhere classified: Secondary | ICD-10-CM | POA: Diagnosis not present

## 2018-12-31 DIAGNOSIS — E1165 Type 2 diabetes mellitus with hyperglycemia: Secondary | ICD-10-CM | POA: Diagnosis not present

## 2018-12-31 DIAGNOSIS — I10 Essential (primary) hypertension: Secondary | ICD-10-CM | POA: Diagnosis not present

## 2018-12-31 DIAGNOSIS — I890 Lymphedema, not elsewhere classified: Secondary | ICD-10-CM | POA: Diagnosis not present

## 2018-12-31 DIAGNOSIS — Z853 Personal history of malignant neoplasm of breast: Secondary | ICD-10-CM | POA: Diagnosis not present

## 2018-12-31 DIAGNOSIS — E785 Hyperlipidemia, unspecified: Secondary | ICD-10-CM | POA: Diagnosis not present

## 2018-12-31 DIAGNOSIS — Z87442 Personal history of urinary calculi: Secondary | ICD-10-CM | POA: Diagnosis not present

## 2018-12-31 DIAGNOSIS — R8290 Unspecified abnormal findings in urine: Secondary | ICD-10-CM | POA: Diagnosis not present

## 2019-01-06 ENCOUNTER — Ambulatory Visit (INDEPENDENT_AMBULATORY_CARE_PROVIDER_SITE_OTHER): Payer: PPO | Admitting: Vascular Surgery

## 2019-01-06 ENCOUNTER — Other Ambulatory Visit: Payer: Self-pay

## 2019-01-06 ENCOUNTER — Encounter (INDEPENDENT_AMBULATORY_CARE_PROVIDER_SITE_OTHER): Payer: Self-pay | Admitting: Vascular Surgery

## 2019-01-06 VITALS — BP 99/66 | HR 86 | Resp 12 | Ht 59.0 in | Wt 140.0 lb

## 2019-01-06 DIAGNOSIS — I890 Lymphedema, not elsewhere classified: Secondary | ICD-10-CM | POA: Diagnosis not present

## 2019-01-06 DIAGNOSIS — E785 Hyperlipidemia, unspecified: Secondary | ICD-10-CM

## 2019-01-06 DIAGNOSIS — I10 Essential (primary) hypertension: Secondary | ICD-10-CM

## 2019-01-06 DIAGNOSIS — E119 Type 2 diabetes mellitus without complications: Secondary | ICD-10-CM

## 2019-01-06 NOTE — Patient Instructions (Signed)

## 2019-01-06 NOTE — Assessment & Plan Note (Signed)
Symptoms well controlled with the lymphedema pump and the daily use of compression stockings.  Swelling is fairly mild at this point.  We will transition to an annual follow-up unless problems develop in the interim.

## 2019-01-06 NOTE — Progress Notes (Signed)
MRN : [id number]  [name] is a 78 y.o. (1941/05/04) female who presents with chief complaint of  Chief Complaint  Patient presents with  . Follow-up  .  History of Present Illness: Patient returns today in follow up of her leg swelling and lymphedema.  She has been diligently wearing her compression stockings and using her lymphedema pumps twice daily.  This has resulted in improved and stable lower extremity swelling and pain symptoms.  The right leg is really not bothered much at all.  The left leg is fairly mild.  No ulceration or infection.  Current Outpatient Medications  Medication Sig Dispense Refill  . acetaminophen (TYLENOL) 500 MG tablet Take 500 mg by mouth QID.    Mar[place] Kitchen alendronate (FOSAMAX) 70 MG tablet Take 70 mg by mouth once a week.     Mar[place] Kitchen allopurinol (ZYLOPRIM) 300 MG tablet Take 300 mg by mouth daily.  11  . Ascorbic Acid (VITAMIN C) 1000 MG tablet Take 1,000 mg by mouth daily.    Marland Kitchen atenolol (TENORMIN) 50 MG tablet Take 50 mg by mouth daily.     Mar[place] Kitchen atorvastatin (LIPITOR) 40 MG tablet Take 40 mg by mouth daily.     Marland Kitchen FERREX 150 150 MG capsule Take 150 mg by mouth daily.  5  . furosemide (LASIX) 40 MG tablet Take 40 mg by mouth daily.    Marland Kitchen levothyroxine (SYNTHROID, LEVOTHROID) 75 MCG tablet Take 75 mcg by mouth daily.     Mar[place] Kitchen lisinopril-hydrochlorothiazide (PRINZIDE,ZESTORETIC) 20-12.5 MG tablet Take 1 tablet by mouth daily.     . metFORMIN (GLUCOPHAGE) 1000 MG tablet Take 1,000 mg by mouth daily with breakfast.     . tamoxifen (NOLVADEX) 20 MG tablet TAKE 1 TABLET BY MOUTH ONCE DAILY. 90 tablet 0  . Vitamin D, Ergocalciferol, (DRISDOL) 50000 UNITS CAPS capsule Take 50,000 Units by mouth every 30 (thirty) days.     Marland Kitchen amoxicillin (AMOXIL) 500 MG capsule Take 500 mg by mouth 3 (three) times daily.     No current facility-administered medications for this visit.     Past Medical History:  Diagnosis Date  . Arthritis   . Arthritis   . Breast cancer ([place]) 1992   right breast with lumpectomy and rad tx  . Cancer of right breast ([place]) 04/16/2013   right breast with mastectomy  . Diabetes mellitus without complication ([place])   . Gout   . High cholesterol   . Hyperlipidemia   . Hypertension   . Personal history of radiation therapy     Past Surgical History:  Procedure Laterality Date  . ABDOMINAL HYSTERECTOMY    . ANKLE FRACTURE SURGERY Right 2004  . BREAST LUMPECTOMY Right 1992   positive  . MASTECTOMY Right 2014  . SHOULDER SURGERY Right 2010   Social History        Tobacco Use  . Smoking status: Never Smoker  . Smokeless tobacco: Never Used  Substance Use Topics  . Alcohol use: No    Alcohol/week: 0.0 standard drinks  . Drug use: No     Family History      Family History  Problem Relation Age of Onset  . Breast cancer Neg Hx   no bleeding disorders, clotting disorders, autoimmune diseases or aneurysms       Allergies  Allergen Reactions  . Other Anaphylaxis    Anesthisia has been a health issue for her in the past.   . Sulfa Antibiotics Rash     REVIEW  OF SYSTEMS(Negative unless checked)  Constitutional: [] ?Weight loss [] ?Fever [] ?Chills Cardiac: [] ?Chest pain [] ?Chest pressure [] ?Palpitations [] ?Shortness of breath when laying flat [] ?Shortness of breath at rest [x] ?Shortness of breath with exertion. Vascular: [] ?Pain in legs with walking [] ?Pain in legs at rest [] ?Pain in legs when laying flat [] ?Claudication [] ?Pain in feet when walking [] ?Pain in feet at rest [] ?Pain in feet when laying flat [] ?History of DVT [] ?Phlebitis [x] ?Swelling in legs [] ?Varicose veins [] ?Non-healing ulcers Pulmonary: [] ?Uses home oxygen [] ?Productive cough [] ?Hemoptysis [] ?Wheeze [] ?COPD [] ?Asthma Neurologic: [] ?Dizziness [] ?Blackouts [] ?Seizures [] ?History of stroke [] ?History of TIA [] ?Aphasia [] ?Temporary blindness [] ?Dysphagia [] ?Weakness or numbness in  arms [] ?Weakness or numbness in legs Musculoskeletal: [x] ?Arthritis [] ?Joint swelling [x] ?Joint pain [x] ?Low back pain Hematologic: [] ?Easy bruising [] ?Easy bleeding [] ?Hypercoagulable state [] ?Anemic  Gastrointestinal: [] ?Blood in stool [] ?Vomiting blood [] ?Gastroesophageal reflux/heartburn [] ?Abdominal pain Genitourinary: [] ?Chronic kidney disease [] ?Difficult urination [] ?Frequent urination [] ?Burning with urination [] ?Hematuria Skin: [] ?Rashes [x] ?Ulcers [x] ?Wounds Psychological: [] ?History of anxiety [] ?History of major depression.    Physical Examination  BP 99/66 (BP Location: Left Arm, Patient Position: Sitting, Cuff Size: Small)   Pulse 86   Resp 12   Ht 4\' 11"  (1.499 m)   Wt 140 lb (63.5 kg)   BMI 28.28 kg/m  Gen:  WD/WN, NAD Head: [place]/AT, No temporalis wasting. Ear/Nose/Throat: Hearing grossly intact, nares w/o erythema or drainage Eyes: Conjunctiva clear. Sclera non-icteric Neck: Supple.  Trachea midline Pulmonary:  Good air movement, no use of accessory muscles.  Cardiac: RRR, no JVD Vascular:  Vessel Right Left  Radial Palpable Palpable                          PT Palpable Palpable  DP Palpable Palpable   Gastrointestinal: soft, non-tender/non-distended. No guarding/reflex.  Musculoskeletal: M/S 5/5 throughout.  No deformity or atrophy.  Trace right lower extremity edema, 1+ left lower extremity edema. Neurologic: Sensation grossly intact in extremities.  Symmetrical.  Speech is fluent.  Psychiatric: Judgment intact, Mood & affect appropriate for pt's clinical situation. Dermatologic: No rashes or ulcers noted.  No cellulitis or open wounds.       Labs Recent Results (from the past 2160 hour(s))  Urinalysis, Complete w Microscopic     Status: Abnormal   Collection Time: 10/17/18 10:59 AM  Result Value Ref Range   Color, Urine YELLOW YELLOW   APPearance HAZY (A) CLEAR   Specific Gravity, Urine 1.010 1.005 -  1.030   pH 5.5 5.0 - 8.0   Glucose, UA NEGATIVE NEGATIVE mg/dL   Hgb urine dipstick NEGATIVE NEGATIVE   Bilirubin Urine NEGATIVE NEGATIVE   Ketones, ur NEGATIVE NEGATIVE mg/dL   Protein, ur NEGATIVE NEGATIVE mg/dL   Nitrite NEGATIVE NEGATIVE   Leukocytes, UA SMALL (A) NEGATIVE   Squamous Epithelial / LPF 6-10 0 - 5   WBC, UA 6-10 0 - 5 WBC/hpf   RBC / HPF 0-5 0 - 5 RBC/hpf   Bacteria, UA RARE (A) NONE SEEN    Comment: Performed at [facility], [address], [place], [place] [address]  Urine Culture     Status: Abnormal   Collection Time: 10/17/18 10:59 AM  Result Value Ref Range   Specimen Description      URINE, CLEAN CATCH Performed at [facility], [address] [place], [place] [address]    Special Requests      NONE Performed at [facility], [address] [place],  [address]    Culture (A)     <  10,000 COLONIES/mL INSIGNIFICANT GROWTH Performed at [facility], [place] [address], [place], [place] [address]    Report Status 10/18/2018 FINAL   CBC with Differential     Status: Abnormal   Collection Time: 12/22/18 10:36 AM  Result Value Ref Range   WBC 12.7 (H) 4.0 - 10.5 K/uL   RBC 3.48 (L) 3.87 - 5.11 MIL/uL   Hemoglobin 10.5 (L) 12.0 - 15.0 g/dL   HCT 33.3 (L) 36.0 - 46.0 %   MCV 95.7 80.0 - 100.0 fL   MCH 30.2 26.0 - 34.0 pg   MCHC 31.5 30.0 - 36.0 g/dL   RDW 16.1 (H) 11.5 - 15.5 %   Platelets 212 150 - 400 K/uL   nRBC 0.0 0.0 - 0.2 %   Neutrophils Relative % 76 %   Neutro Abs 9.8 (H) 1.7 - 7.7 K/uL   Lymphocytes Relative 16 %   Lymphs Abs 2.0 0.7 - 4.0 K/uL   Monocytes Relative 5 %   Monocytes Absolute 0.7 0.1 - 1.0 K/uL   Eosinophils Relative 1 %   Eosinophils Absolute 0.1 0.0 - 0.5 K/uL   Basophils Relative 1 %   Basophils Absolute 0.1 0.0 - 0.1 K/uL   Immature Granulocytes 1 %   Abs Immature Granulocytes 0.10 (H) 0.00 - 0.07 K/uL    Comment: Performed at [facility], [address]  [place], [place] [address]  Comprehensive metabolic panel     Status: Abnormal   Collection Time: 12/22/18 10:36 AM  Result Value Ref Range   Sodium 138 135 - 145 mmol/L   Potassium 3.4 (L) 3.5 - 5.1 mmol/L   Chloride 103 98 - 111 mmol/L   CO2 25 22 - 32 mmol/L   Glucose, Bld 154 (H) 70 - 99 mg/dL   BUN 44 (H) 8 - 23 mg/dL   Creatinine, Ser 1.14 (H) 0.44 - 1.00 mg/dL   Calcium 8.6 (L) 8.9 - 10.3 mg/dL   Total Protein 7.1 6.5 - 8.1 g/dL   Albumin 3.8 3.5 - 5.0 g/dL   AST 23 15 - 41 U/L   ALT 15 0 - 44 U/L   Alkaline Phosphatase 45 38 - 126 U/L   Total Bilirubin 0.5 0.3 - 1.2 mg/dL   GFR calc non Af Amer 47 (L) >60 mL/min   GFR calc Af Amer 54 (L) >60 mL/min   Anion gap 10 5 - 15    Comment: Performed at [facility], [address] [place], [place] [address]    Radiology Mm 3d Screen Breast Uni Left  Result Date: 12/24/2018 CLINICAL DATA:  Screening. EXAM: DIGITAL SCREENING UNILATERAL LEFT MAMMOGRAM WITH CAD AND TOMO COMPARISON:  Previous exam(s). ACR Breast Density Category b: There are scattered areas of fibroglandular density. FINDINGS: The patient has had a right mastectomy. There are no findings suspicious for malignancy. Images were processed with CAD. IMPRESSION: No mammographic evidence of malignancy. A result letter of this screening mammogram will be mailed directly to the patient. RECOMMENDATION: Screening mammogram in one year.  (Code:SM-L-35M) BI-RADS CATEGORY  1: Negative. Electronically Signed   By: Fidela Salisbury M.D.   On: 12/24/2018 14:01    Assessment/Plan Diabetes mellitus, type 2 blood glucose control important in reducing the progression of atherosclerotic disease. Also, involved in wound healing. On appropriate medications.   Hypertension blood pressure control important in reducing the progression of atherosclerotic disease. On appropriate oral medications.   HLD (hyperlipidemia) lipid control important in reducing the progression of  atherosclerotic disease. Continue statin therapy  Lymphedema Symptoms well controlled with the lymphedema pump and the daily use of compression stockings.  Swelling is fairly mild at this point.  We will transition to an annual follow-up unless problems develop in the interim.    [name], MD  01/06/2019 3:08 PM    This note was created with Dragon medical transcription system.  Any errors from dictation are purely unintentional

## 2019-01-15 DIAGNOSIS — M1712 Unilateral primary osteoarthritis, left knee: Secondary | ICD-10-CM | POA: Diagnosis not present

## 2019-01-29 ENCOUNTER — Other Ambulatory Visit: Payer: Self-pay

## 2019-01-29 ENCOUNTER — Ambulatory Visit
Payer: PPO | Attending: Student in an Organized Health Care Education/Training Program | Admitting: Student in an Organized Health Care Education/Training Program

## 2019-01-29 ENCOUNTER — Encounter: Payer: Self-pay | Admitting: Student in an Organized Health Care Education/Training Program

## 2019-01-29 VITALS — BP 100/54 | HR 70 | Temp 98.2°F | Resp 18 | Ht 59.0 in | Wt 140.0 lb

## 2019-01-29 DIAGNOSIS — M1712 Unilateral primary osteoarthritis, left knee: Secondary | ICD-10-CM | POA: Diagnosis not present

## 2019-01-29 DIAGNOSIS — I739 Peripheral vascular disease, unspecified: Secondary | ICD-10-CM | POA: Insufficient documentation

## 2019-01-29 DIAGNOSIS — M25562 Pain in left knee: Secondary | ICD-10-CM | POA: Diagnosis not present

## 2019-01-29 DIAGNOSIS — G8929 Other chronic pain: Secondary | ICD-10-CM | POA: Diagnosis not present

## 2019-01-29 DIAGNOSIS — G894 Chronic pain syndrome: Secondary | ICD-10-CM | POA: Diagnosis not present

## 2019-01-29 MED ORDER — ORPHENADRINE CITRATE 30 MG/ML IJ SOLN
30.0000 mg | Freq: Once | INTRAMUSCULAR | Status: AC
  Administered 2019-01-29: 30 mg via INTRAMUSCULAR

## 2019-01-29 MED ORDER — ORPHENADRINE CITRATE 30 MG/ML IJ SOLN
INTRAMUSCULAR | Status: AC
  Filled 2019-01-29: qty 2

## 2019-01-29 NOTE — Patient Instructions (Signed)
____________________________________________________________________________________________  Preparing for your procedure (without sedation)  Procedure appointments are limited to planned procedures: . No Prescription Refills. . No disability issues will be discussed. . No medication changes will be discussed.  Instructions: . Oral Intake: Do not eat or drink anything for at least 3 hours prior to your procedure. . Transportation: Unless otherwise stated by your physician, you may drive yourself after the procedure. . Blood Pressure Medicine: Take your blood pressure medicine with a sip of water the morning of the procedure. . Blood thinners: Notify our staff if you are taking any blood thinners. Depending on which one you take, there will be specific instructions on how and when to stop it. . Diabetics on insulin: Notify the staff so that you can be scheduled 1st case in the morning. If your diabetes requires high dose insulin, take only  of your normal insulin dose the morning of the procedure and notify the staff that you have done so. . Preventing infections: Shower with an antibacterial soap the morning of your procedure.  . Build-up your immune system: Take 1000 mg of Vitamin C with every meal (3 times a day) the day prior to your procedure. . Antibiotics: Inform the staff if you have a condition or reason that requires you to take antibiotics before dental procedures. . Pregnancy: If you are pregnant, call and cancel the procedure. . Sickness: If you have a cold, fever, or any active infections, call and cancel the procedure. . Arrival: You must be in the facility at least 30 minutes prior to your scheduled procedure. . Children: Do not bring any children with you. . Dress appropriately: Bring dark clothing that you would not mind if they get stained. . Valuables: Do not bring any jewelry or valuables.  Reasons to call and reschedule or cancel your procedure: (Following these  recommendations will minimize the risk of a serious complication.) . Surgeries: Avoid having procedures within 2 weeks of any surgery. (Avoid for 2 weeks before or after any surgery). . Flu Shots: Avoid having procedures within 2 weeks of a flu shots or . (Avoid for 2 weeks before or after immunizations). . Barium: Avoid having a procedure within 7-10 days after having had a radiological study involving the use of radiological contrast. (Myelograms, Barium swallow or enema study). . Heart attacks: Avoid any elective procedures or surgeries for the initial 6 months after a "Myocardial Infarction" (Heart Attack). . Blood thinners: It is imperative that you stop these medications before procedures. Let us know if you if you take any blood thinner.  . Infection: Avoid procedures during or within two weeks of an infection (including chest colds or gastrointestinal problems). Symptoms associated with infections include: Localized redness, fever, chills, night sweats or profuse sweating, burning sensation when voiding, cough, congestion, stuffiness, runny nose, sore throat, diarrhea, nausea, vomiting, cold or Flu symptoms, recent or current infections. It is specially important if the infection is over the area that we intend to treat. . Heart and lung problems: Symptoms that may suggest an active cardiopulmonary problem include: cough, chest pain, breathing difficulties or shortness of breath, dizziness, ankle swelling, uncontrolled high or unusually low blood pressure, and/or palpitations. If you are experiencing any of these symptoms, cancel your procedure and contact your primary care physician for an evaluation.  Remember:  Regular Business hours are:  Monday to Thursday 8:00 AM to 4:00 PM  Provider's Schedule: Francisco Naveira, MD:  Procedure days: Tuesday and Thursday 7:30 AM to 4:00 PM  Bilal   Lateef, MD:  Procedure days: Monday and Wednesday 7:30 AM to 4:00  PM ____________________________________________________________________________________________    

## 2019-01-29 NOTE — Progress Notes (Signed)
Safety precautions to be maintained throughout the outpatient stay will include: orient to surroundings, keep bed in low position, maintain call bell within reach at all times, provide assistance with transfer out of bed and ambulation.  

## 2019-01-29 NOTE — Progress Notes (Signed)
Patient's Name: [name]  MRN: [id number]  Referring Provider: Leanor Kail, MD  DOB: 1940/11/17  PCP: Tracie Harrier, MD  DOS: 01/29/2019  Note by: [name] Santa, MD  Service setting: Ambulatory outpatient  Specialty: Interventional Pain Management  Location: [facility] (AMB) Pain Management Facility  Visit type: Initial Patient Evaluation  Patient type: New Patient   Primary Reason(s) for Visit: Encounter for initial evaluation of one or more chronic problems (new to examiner) potentially causing chronic pain, and posing a threat to normal musculoskeletal function. (Level of risk: High) CC: New Patient (Initial Visit) and Knee Pain  HPI  [name] is a 78 y.o. year old, female patient, who comes today to see [place] for the first time for an initial evaluation of her chronic pain. She has Arthritis; Chronic pain; Soft tissue lesion of shoulder region; Diabetes mellitus, type 2 ([place]); H/O renal calculi; Hypertension; HLD (hyperlipidemia); Neuropathy; OP (osteoporosis); H/O malignant neoplasm of breast; Cancer of right breast ([place]); PAD (peripheral artery disease) ([place]); Lymphedema; Lower limb ulcer, ankle, right, limited to breakdown of skin ([place]); Acquired hypothyroidism; Benign essential hypertension; Bilateral edema of lower extremity; Chronic gouty arthritis; Chronic pain of both knees; Ecchymosis; Generalized osteoarthritis of hand; History of mastectomy, right; Hyperuricemia; Leg pain, bilateral; Primary osteoarthritis of left knee; and Swelling of finger on their problem list. Today she comes in for evaluation of her New Patient (Initial Visit) and Knee Pain  Pain Assessment: Location: Left Knee Radiating: Denies  Onset: More than a month ago Duration: Chronic pain Quality: Aching Severity: 7 /10 (subjective, self-reported pain score)  Note: Reported level is inconsistent with clinical observations.                         When using our objective Pain Scale, levels between 6 and  10/10 are said to belong in an emergency room, as it progressively worsens from a 6/10, described as severely limiting, requiring emergency care not usually available at an outpatient pain management facility. At a 6/10 level, communication becomes difficult and requires great effort. Assistance to reach the emergency department may be required. Facial flushing and profuse sweating along with potentially dangerous increases in heart rate and blood pressure will be evident. Effect on ADL: "Affects everthing because have to use wheelchair"  Timing: Constant Modifying factors: Previous streroid injections, Tylenol '500mg'$  daily, sitting  BP: (!) 100/54  HR: 70  Onset and Duration: Date of onset: 2017 Cause of pain: Unknown Severity: NAS-11 now: 7/10 Timing: Not influenced by the time of the day and After a period of immobility Aggravating Factors: Bending, Walking and Walking uphill Alleviating Factors: Lying down Associated Problems: Weakness Quality of Pain: Aching and Constant Previous Examinations or Tests: The patient denies previous treatment  Previous Treatments: Cortisone Injections   The patient comes into the clinics today for the first time for a chronic pain management evaluation.   Very pleasant 78 year old female who is being referred for consideration of left genicular nerve block and possible radiofrequency ablation.  Patient has a long history of persistent left knee pain secondary to osteoarthritis of left knee.  She is status post left knee intra-articular steroid injections which were effective for her knee pain but have gotten less effective over time.  She wants to avoid left knee replacement surgery at all costs.  Patient has been utilizing Tylenol to help manage her pain.  Historic Controlled Substance Pharmacotherapy Review  Historical Background Evaluation: [place] PMP: Six (6) year initial  data search conducted.             [place] Department of public safety, offender search:  Editor, commissioning Information) Non-contributory Risk Assessment Profile: Aberrant behavior: None observed or detected today Risk factors for fatal opioid overdose: None identified today Fatal overdose hazard ratio (HR): Calculation deferred Non-fatal overdose hazard ratio (HR): Calculation deferred Risk of opioid abuse or dependence: 0.7-3.0% with doses ? 36 MME/day and 6.1-26% with doses ? 120 MME/day. Substance use disorder (SUD) risk level: See below Personal History of Substance Abuse (SUD-Substance use disorder):  Alcohol: Negative  Illegal Drugs: Negative  Rx Drugs: Negative  ORT Risk Level calculation: Low Risk Opioid Risk Tool - 01/29/19 0904      Family History of Substance Abuse   Alcohol  Negative    Illegal Drugs  Negative    Rx Drugs  Negative      Personal History of Substance Abuse   Alcohol  Negative    Illegal Drugs  Negative    Rx Drugs  Negative      Age   Age between 58-45 years   No      History of Preadolescent Sexual Abuse   History of Preadolescent Sexual Abuse  Negative or Female      Psychological Disease   Psychological Disease  Negative    Depression  Negative      Total Score   Opioid Risk Tool Scoring  0    Opioid Risk Interpretation  Low Risk      ORT Scoring interpretation table:  Score <3 = Low Risk for SUD  Score between 4-7 = Moderate Risk for SUD  Score >8 = High Risk for Opioid Abuse   PHQ-2 Depression Scale:  Total score: 0  PHQ-2 Scoring interpretation table: (Score and probability of major depressive disorder)  Score 0 = No depression  Score 1 = 15.4% Probability  Score 2 = 21.1% Probability  Score 3 = 38.4% Probability  Score 4 = 45.5% Probability  Score 5 = 56.4% Probability  Score 6 = 78.6% Probability   PHQ-9 Depression Scale:  Total score: 0  PHQ-9 Scoring interpretation table:  Score 0-4 = No depression  Score 5-9 = Mild depression  Score 10-14 = Moderate depression  Score 15-19 = Moderately severe depression  Score  20-27 = Severe depression (2.4 times higher risk of SUD and 2.89 times higher risk of overuse)    Meds   Current Outpatient Medications:  .  acetaminophen (TYLENOL) 500 MG tablet, Take 500 mg by mouth QID., Disp: , Rfl:  .  alendronate (FOSAMAX) 70 MG tablet, Take 70 mg by mouth once a week. , Disp: , Rfl:  .  allopurinol (ZYLOPRIM) 300 MG tablet, Take 300 mg by mouth daily., Disp: , Rfl: 11 .  Ascorbic Acid (VITAMIN C) 1000 MG tablet, Take 1,000 mg by mouth daily., Disp: , Rfl:  .  atenolol (TENORMIN) 50 MG tablet, Take 50 mg by mouth daily. , Disp: , Rfl:  .  atorvastatin (LIPITOR) 40 MG tablet, Take 40 mg by mouth daily. , Disp: , Rfl:  .  FERREX 150 150 MG capsule, Take 150 mg by mouth daily., Disp: , Rfl: 5 .  furosemide (LASIX) 40 MG tablet, Take 40 mg by mouth daily., Disp: , Rfl:  .  levothyroxine (SYNTHROID, LEVOTHROID) 75 MCG tablet, Take 75 mcg by mouth daily. , Disp: , Rfl:  .  lisinopril-hydrochlorothiazide (PRINZIDE,ZESTORETIC) 20-12.5 MG tablet, Take 1 tablet by mouth daily. , Disp: ,  Rfl:  .  metFORMIN (GLUCOPHAGE) 1000 MG tablet, Take 1,000 mg by mouth daily with breakfast. , Disp: , Rfl:  .  tamoxifen (NOLVADEX) 20 MG tablet, TAKE 1 TABLET BY MOUTH ONCE DAILY., Disp: 90 tablet, Rfl: 0 .  Vitamin D, Ergocalciferol, (DRISDOL) 50000 UNITS CAPS capsule, Take 50,000 Units by mouth every 30 (thirty) days. , Disp: , Rfl:  .  amoxicillin (AMOXIL) 500 MG capsule, Take 500 mg by mouth 3 (three) times daily., Disp: , Rfl:   Imaging Review   Ankle Imaging: Ankle-R DG Complete:  Results for orders placed during the hospital encounter of 09/19/15  DG Ankle Complete Right   Narrative CLINICAL DATA:  Cellulitis RIGHT lower leg beginning 1 week ago, question osteomyelitis; history of surgery in 2014  EXAM: RIGHT ANKLE - COMPLETE 3+ VIEW  COMPARISON:  None  FINDINGS: Screw fragment distal tibial metaphysis.  Deformity of distal tibia and fibula post healed  fractures.  Secondary degenerative changes of ankle joint with joint space narrowing and spur formation as well as subchondral sclerosis at the tibia and talar dome deformity.  Regional soft tissue swelling.  Additional subtalar degenerative changes.  New bulky spur formation at Achilles tendon insertion with focal soft tissue swelling dorsally.  No acute fracture, dislocation, or bone destruction.  IMPRESSION: Posttraumatic and postsurgical changes of the RIGHT ankle with secondary degenerative changes of the tibiotalar joint and to a lesser degree subtalar joints.  No definite acute bony abnormalities.   Electronically Signed   By: Lavonia Dana M.D.   On: 09/20/2015 07:51    Complexity Note: Imaging results reviewed. Results shared with Ms. Ehrhard, using [employer].                         ROS  Cardiovascular: High blood pressure Pulmonary or Respiratory: No reported pulmonary signs or symptoms such as wheezing and difficulty taking a deep full breath (Asthma), difficulty blowing air out (Emphysema), coughing up mucus (Bronchitis), persistent dry cough, or temporary stoppage of breathing during sleep Neurological: No reported neurological signs or symptoms such as seizures, abnormal skin sensations, urinary and/or fecal incontinence, being born with an abnormal open spine and/or a tethered spinal cord Review of Past Neurological Studies: No results found for this or any previous visit. Psychological-Psychiatric: No reported psychological or psychiatric signs or symptoms such as difficulty sleeping, anxiety, depression, delusions or hallucinations (schizophrenial), mood swings (bipolar disorders) or suicidal ideations or attempts Gastrointestinal: No reported gastrointestinal signs or symptoms such as vomiting or evacuating blood, reflux, heartburn, alternating episodes of diarrhea and constipation, inflamed or scarred liver, or pancreas or irrregular and/or infrequent  bowel movements Genitourinary: Passing kidney stones Hematological: Brusing easily Endocrine: High blood sugar controlled without the use of insulin (NIDDM) and High thyroid Rheumatologic: No reported rheumatological signs and symptoms such as fatigue, joint pain, tenderness, swelling, redness, heat, stiffness, decreased range of motion, with or without associated rash Musculoskeletal: Negative for myasthenia gravis, muscular dystrophy, multiple sclerosis or malignant hyperthermia Work History: Retired  Allergies  Ms. Deatley is allergic to other and sulfa antibiotics.  Laboratory Chemistry  Inflammation Markers (CRP: Acute Phase) (ESR: Chronic Phase) No results found for: CRP, ESRSEDRATE, LATICACIDVEN                       Rheumatology Markers No results found for: RF, ANA, LABURIC, [place], [place], LYMEABIGMQN, HLAB27  Renal Function Markers Lab Results  Component Value Date   BUN 44 (H) 12/22/2018   CREATININE 1.14 (H) 12/22/2018   BCR 31 (H) 09/08/2018   GFRAA 54 (L) 12/22/2018   GFRNONAA 47 (L) 12/22/2018                             Hepatic Function Markers Lab Results  Component Value Date   AST 23 12/22/2018   ALT 15 12/22/2018   ALBUMIN 3.8 12/22/2018   ALKPHOS 45 12/22/2018                        Electrolytes Lab Results  Component Value Date   NA 138 12/22/2018   K 3.4 (L) 12/22/2018   CL 103 12/22/2018   CALCIUM 8.6 (L) 12/22/2018                        Neuropathy Markers Lab Results  Component Value Date   VITAMINB12 1,487 (H) 06/02/2018                        CNS Tests No results found for: COLORCSF, APPEARCSF, RBCCOUNTCSF, WBCCSF, POLYSCSF, LYMPHSCSF, EOSCSF, PROTEINCSF, GLUCCSF, JCVIRUS, CSFOLI, IGGCSF                      Bone Pathology Markers No results found for: [place], [id number], [id number], [id number], 25OHVITD1, 25OHVITD2, 25OHVITD3, TESTOFREE, TESTOSTERONE                       Coagulation Parameters Lab  Results  Component Value Date   PLT 212 12/22/2018                        Cardiovascular Markers Lab Results  Component Value Date   HGB 10.5 (L) 12/22/2018   HCT 33.3 (L) 12/22/2018                         CA Markers Lab Results  Component Value Date   LABCA2 27.4 09/16/2013                        Endocrine Markers No results found for: TSH, FREET4, TESTOFREE, TESTOSTERONE, ESTRADIOL, ESTRADIOLPCT, ESTRADIOLFRE                      Note: Lab results reviewed.  [place]  Drug: Ms. Flammia  reports no history of drug use. Alcohol:  reports no history of alcohol use. Tobacco:  reports that she has never smoked. She has never used smokeless tobacco. Medical:  has a past medical history of Arthritis, Arthritis, Breast cancer ([place]) (1992), Cancer of right breast ([place]) (04/16/2013), Diabetes mellitus without complication ([place]), Gout, High cholesterol, Hyperlipidemia, Hypertension, and Personal history of radiation therapy. Family: family history is not on file.  Past Surgical History:  Procedure Laterality Date  . ABDOMINAL HYSTERECTOMY    . ANKLE FRACTURE SURGERY Right 2004  . BREAST LUMPECTOMY Right 1992   positive  . MASTECTOMY Right 2014  . SHOULDER SURGERY Right 2010   Active Ambulatory Problems    Diagnosis Date Noted  . Arthritis 01/11/2014  . Chronic pain 03/03/2015  . Soft tissue lesion of shoulder region 01/11/2014  . Diabetes mellitus, type 2 ([place]) 01/11/2014  . H/O renal  calculi 01/11/2014  . Hypertension 01/11/2014  . HLD (hyperlipidemia) 01/11/2014  . Neuropathy 01/11/2014  . OP (osteoporosis) 01/11/2014  . H/O malignant neoplasm of breast 01/11/2014  . Cancer of right breast ([place]) 04/17/2015  . PAD (peripheral artery disease) ([place]) 04/30/2017  . Lymphedema 04/30/2017  . Lower limb ulcer, ankle, right, limited to breakdown of skin ([place]) 09/09/2018  . Acquired hypothyroidism 01/24/2017  . Benign essential hypertension 01/24/2017  . Bilateral edema of lower  extremity 04/03/2017  . Chronic gouty arthritis 04/29/2018  . Chronic pain of both knees 03/03/2015  . Ecchymosis 04/03/2017  . Generalized osteoarthritis of hand 02/12/2018  . History of mastectomy, right 01/24/2017  . Hyperuricemia 02/12/2018  . Leg pain, bilateral 04/03/2017  . Primary osteoarthritis of left knee 02/29/2016  . Swelling of finger 02/12/2018   Resolved Ambulatory Problems    Diagnosis Date Noted  . No Resolved Ambulatory Problems   Past Medical History:  Diagnosis Date  . Breast cancer ([place]) 1992  . Diabetes mellitus without complication ([place])   . Gout   . High cholesterol   . Hyperlipidemia   . Personal history of radiation therapy    Constitutional Exam  General appearance: Well nourished, well developed, and well hydrated. In no apparent acute distress Vitals:   01/29/19 0855  BP: (!) 100/54  Pulse: 70  Resp: 18  Temp: 98.2 F (36.8 C)  SpO2: 100%  Weight: 140 lb (63.5 kg)  Height: '4\' 11"'$  (1.499 m)   BMI Assessment: Estimated body mass index is 28.28 kg/m as calculated from the following:   Height as of this encounter: '4\' 11"'$  (1.499 m).   Weight as of this encounter: 140 lb (63.5 kg).  BMI interpretation table: BMI level Category Range association with higher incidence of chronic pain  <18 kg/m2 Underweight   18.5-24.9 kg/m2 Ideal body weight   25-29.9 kg/m2 Overweight Increased incidence by 20%  30-34.9 kg/m2 Obese (Class I) Increased incidence by 68%  35-39.9 kg/m2 Severe obesity (Class II) Increased incidence by 136%  >40 kg/m2 Extreme obesity (Class III) Increased incidence by 254%   Patient's current BMI Ideal Body weight  Body mass index is 28.28 kg/m. Patient must be at least 60 in tall to calculate ideal body weight   BMI Readings from Last 4 Encounters:  01/29/19 28.28 kg/m  01/06/19 28.28 kg/m  12/22/18 27.76 kg/m  09/16/18 26.83 kg/m   Wt Readings from Last 4 Encounters:  01/29/19 140 lb (63.5 kg)  01/06/19 140 lb  (63.5 kg)  12/22/18 146 lb 14.4 oz (66.6 kg)  09/16/18 142 lb (64.4 kg)  Psych/Mental status: Alert, oriented x 3 (person, place, & time)       Eyes: PERLA Respiratory: No evidence of acute respiratory distress   Lumbar Spine Area Exam  Skin & Axial Inspection: No masses, redness, or swelling Alignment: Symmetrical Functional ROM: Decreased ROM       Stability: No instability detected Muscle Tone/Strength: Functionally intact. No obvious neuro-muscular anomalies detected. Sensory (Neurological): Movement-associated discomfort Palpation: No palpable anomalies       Provocative Tests: Hyperextension/rotation test: deferred today       Lumbar quadrant test (Kemp's test): deferred today       Lateral bending test: deferred today       Patrick's Maneuver: deferred today                   FABER* test: deferred today  S-I anterior distraction/compression test: deferred today         S-I lateral compression test: deferred today         S-I Thigh-thrust test: deferred today         S-I Gaenslen's test: deferred today         *(Flexion, ABduction and External Rotation)  Gait & Posture Assessment  Ambulation: Patient came in today in a wheel chair Gait: Significantly limited. Dependent on assistive device to ambulate Posture: Difficulty standing up straight, due to pain   Lower Extremity Exam    Side: Right lower extremity  Side: Left lower extremity  Stability: No instability observed          Stability: No instability observed          Skin & Extremity Inspection: Skin color, temperature, and hair growth are WNL. No peripheral edema or cyanosis. No masses, redness, swelling, asymmetry, or associated skin lesions. No contractures.  Skin & Extremity Inspection: Evidence of prior arthroplastic surgery  Functional ROM: Unrestricted ROM                  Functional ROM: Pain restricted ROM                  Muscle Tone/Strength: Functionally intact. No obvious neuro-muscular  anomalies detected.  Muscle Tone/Strength: Functionally intact. No obvious neuro-muscular anomalies detected.  Sensory (Neurological): Unimpaired        Sensory (Neurological): Arthropathic arthralgia        DTR: Patellar: 1+: trace Achilles: deferred today Plantar: deferred today  DTR: Patellar: 0: absent Achilles: deferred today Plantar: deferred today  Palpation: No palpable anomalies  Palpation: No palpable anomalies   Assessment  Primary Diagnosis & Pertinent Problem List: The primary encounter diagnosis was Chronic pain of left knee. Diagnoses of Primary osteoarthritis of left knee, PAD (peripheral artery disease) ([place]), and Chronic pain syndrome were also pertinent to this visit.  Visit Diagnosis (New problems to examiner): 1. Chronic pain of left knee   2. Primary osteoarthritis of left knee   3. PAD (peripheral artery disease) ([place])   4. Chronic pain syndrome    Plan of Care (Initial workup plan)    Very pleasant 78 year old female who is being referred for consideration of left genicular nerve block and possible radiofrequency ablation.  Patient has a long history of persistent left knee pain secondary to osteoarthritis of left knee.  She is status post left knee intra-articular steroid injections which were effective for her knee pain but have gotten less effective over time.  She wants to avoid left knee replacement surgery at all costs.  Patient has been utilizing Tylenol to help manage her pain.  Risks and benefits of genicular nerve block and possible radiofrequency ablation were discussed in great detail with the patient.  Patient would like to proceed.  Intramuscular Norflex injection today as well for increased knee pain.  We will avoid Toradol given chronic kidney disease.  Ordered Lab-work, Procedure(s), Referral(s), & Consult(s): Orders Placed This Encounter  Procedures  . GENICULAR NERVE BLOCK   Pharmacotherapy (current): Medications ordered:  Meds ordered  this encounter  Medications  . orphenadrine (NORFLEX) injection 30 mg   Medications administered during this visit: We administered orphenadrine.    Provider-requested follow-up: Return for Procedure.  Future Appointments  Date Time Provider [place]  02/16/2019 12:00 PM [name] Santa, MD [facility] None  12/24/2019  2:15 PM Sindy Guadeloupe, MD [facility] None  01/08/2020  1:00 PM [place],  Erskine Squibb, MD [facility] None    Primary Care Physician: Tracie Harrier, MD Location: [facility] Note by: [name], M.D, Date: 01/29/2019; Time: 10:17 AM  Patient Instructions  ____________________________________________________________________________________________  Preparing for your procedure (without sedation)  Procedure appointments are limited to planned procedures: . No Prescription Refills. . No disability issues will be discussed. . No medication changes will be discussed.  Instructions: . Oral Intake: Do not eat or drink anything for at least 3 hours prior to your procedure. . Transportation: Unless otherwise stated by your physician, you may drive yourself after the procedure. . Blood Pressure Medicine: Take your blood pressure medicine with a sip of water the morning of the procedure. . Blood thinners: Notify our staff if you are taking any blood thinners. Depending on which one you take, there will be specific instructions on how and when to stop it. . Diabetics on insulin: Notify the staff so that you can be scheduled 1st case in the morning. If your diabetes requires high dose insulin, take only  of your normal insulin dose the morning of the procedure and notify the staff that you have done so. . Preventing infections: Shower with an antibacterial soap the morning of your procedure.  . Build-up your immune system: Take 1000 mg of Vitamin C with every meal (3 times a day) the day prior to your procedure. Marland Kitchen Antibiotics: Inform the staff if  you have a condition or reason that requires you to take antibiotics before dental procedures. . Pregnancy: If you are pregnant, call and cancel the procedure. . Sickness: If you have a cold, fever, or any active infections, call and cancel the procedure. . Arrival: You must be in the facility at least 30 minutes prior to your scheduled procedure. . Children: Do not bring any children with you. . Dress appropriately: Bring dark clothing that you would not mind if they get stained. . Valuables: Do not bring any jewelry or valuables.  Reasons to call and reschedule or cancel your procedure: (Following these recommendations will minimize the risk of a serious complication.) . Surgeries: Avoid having procedures within 2 weeks of any surgery. (Avoid for 2 weeks before or after any surgery). . Flu Shots: Avoid having procedures within 2 weeks of a flu shots or . (Avoid for 2 weeks before or after immunizations). . Barium: Avoid having a procedure within 7-10 days after having had a radiological study involving the use of radiological contrast. (Myelograms, Barium swallow or enema study). . Heart attacks: Avoid any elective procedures or surgeries for the initial 6 months after a "Myocardial Infarction" (Heart Attack). . Blood thinners: It is imperative that you stop these medications before procedures. Let us know if you if you take any blood thinner.  . Infection: Avoid procedures during or within two weeks of an infection (including chest colds or gastrointestinal problems). Symptoms associated with infections include: Localized redness, fever, chills, night sweats or profuse sweating, burning sensation when voiding, cough, congestion, stuffiness, runny nose, sore throat, diarrhea, nausea, vomiting, cold or Flu symptoms, recent or current infections. It is specially important if the infection is over the area that we intend to treat. Marland Kitchen Heart and lung problems: Symptoms that may suggest an active  cardiopulmonary problem include: cough, chest pain, breathing difficulties or shortness of breath, dizziness, ankle swelling, uncontrolled high or unusually low blood pressure, and/or palpitations. If you are experiencing any of these symptoms, cancel your procedure and contact your primary care physician for an evaluation.  Remember:  Regular Business hours are:  Monday to Thursday 8:00 AM to 4:00 PM  Provider's Schedule: Milinda Pointer, MD:  Procedure days: Tuesday and Thursday 7:30 AM to 4:00 PM  [name] Santa, MD:  Procedure days: Monday and Wednesday 7:30 AM to 4:00 PM ____________________________________________________________________________________________

## 2019-01-30 IMAGING — CT CT ABD-PELV W/O CM
1 of 2 series · 15 of 32 positions shown, 19 images · non-contrast
Comparison: None.

CLINICAL DATA: Microhematuria.

EXAM:
CT ABDOMEN AND PELVIS WITHOUT CONTRAST
TECHNIQUE: Multidetector CT imaging of the abdomen and pelvis was performed
following the standard protocol without IV contrast.

[Series 2: axial st · axial · 0.69mm/px · z∈[-764,-349]mm · 15 of 91 slices shown, 19 images]
[im 4/91  soft-tissue]
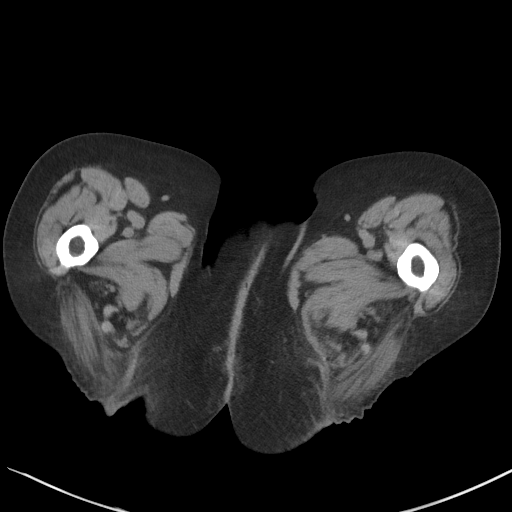
[im 4/91  bone]
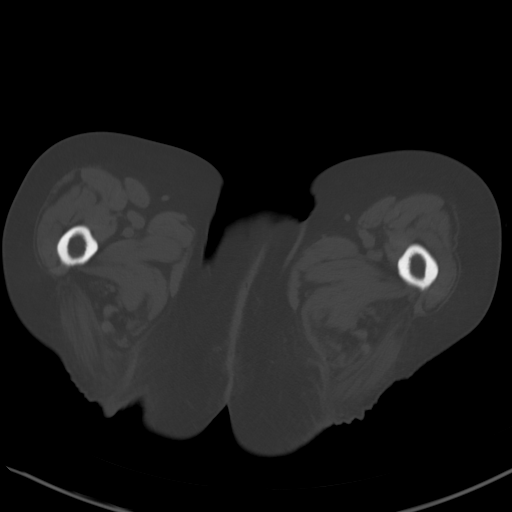
[im 12/91  soft-tissue]
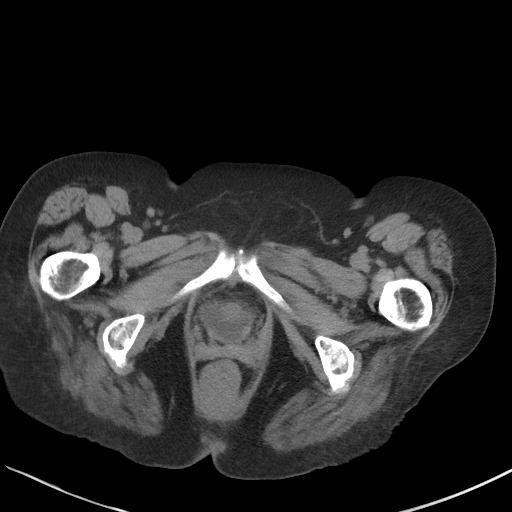
[im 20/91  soft-tissue]
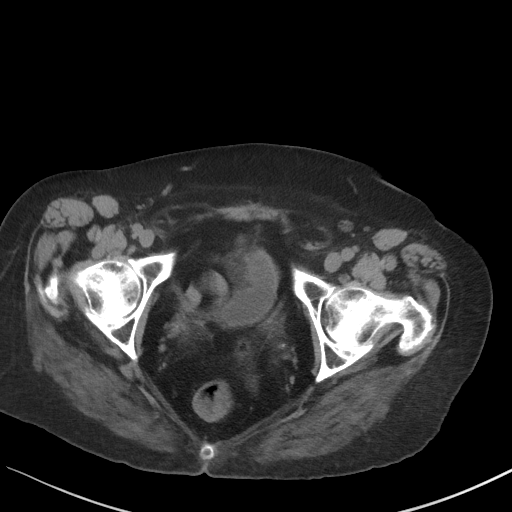
[im 24/91  soft-tissue]
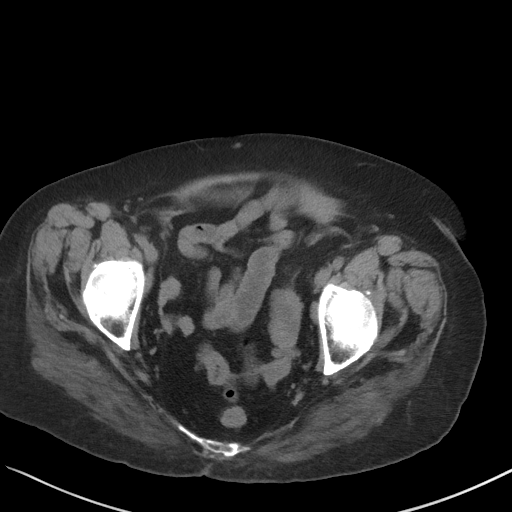
[im 32/91  soft-tissue]
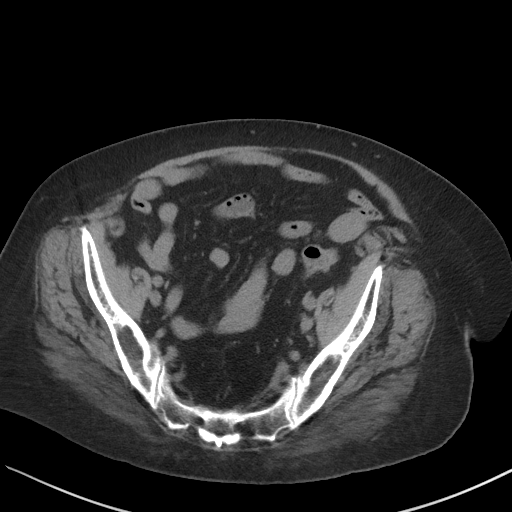
[im 40/91  soft-tissue]
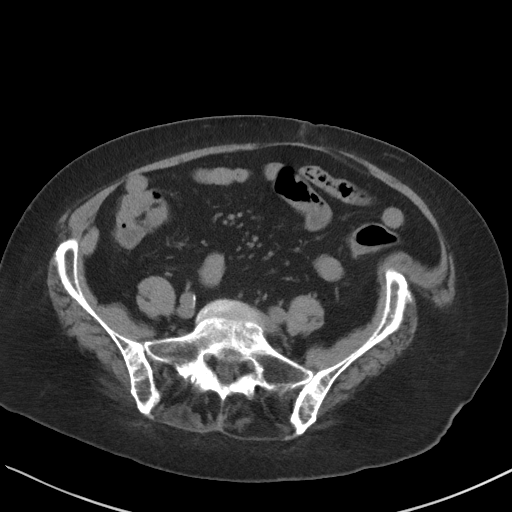
[im 47/91  soft-tissue]
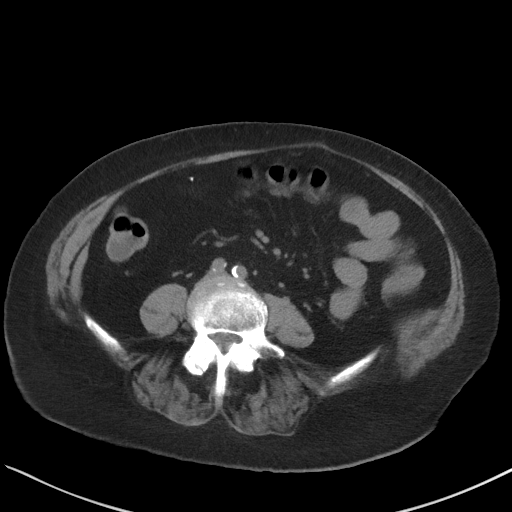
[im 51/91  soft-tissue]
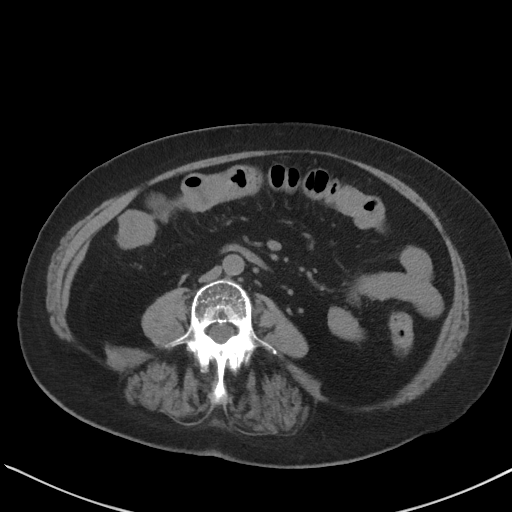
[im 59/91  soft-tissue]
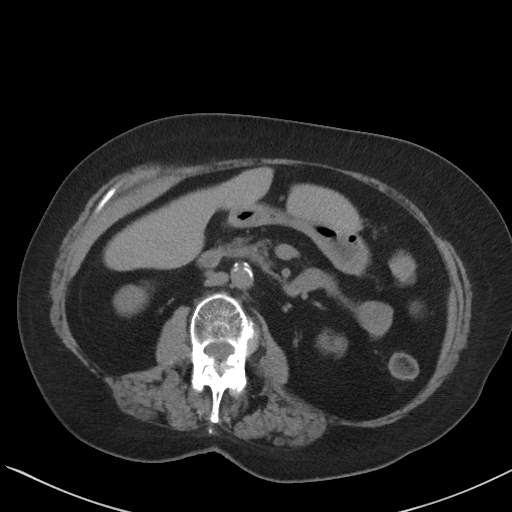
[im 59/91  bone]
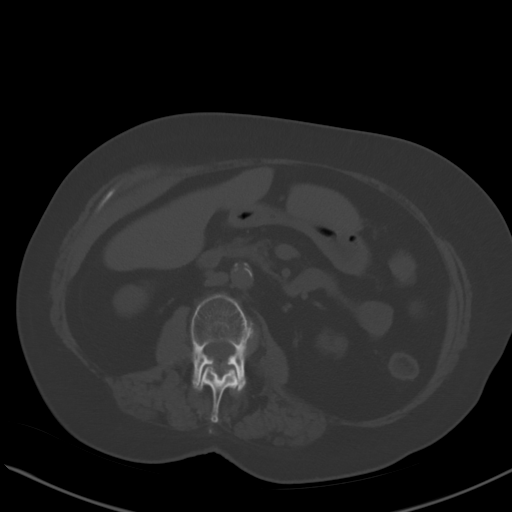
[im 67/91  soft-tissue]
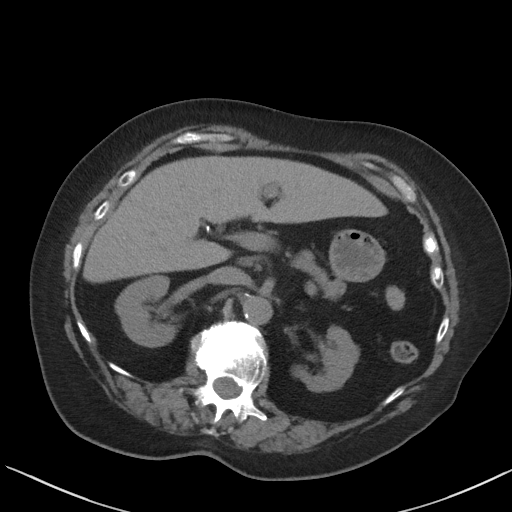
[im 71/91  soft-tissue]
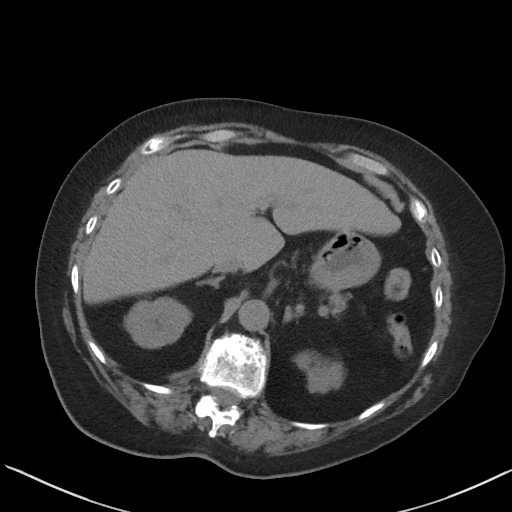
[im 75/91  lung]
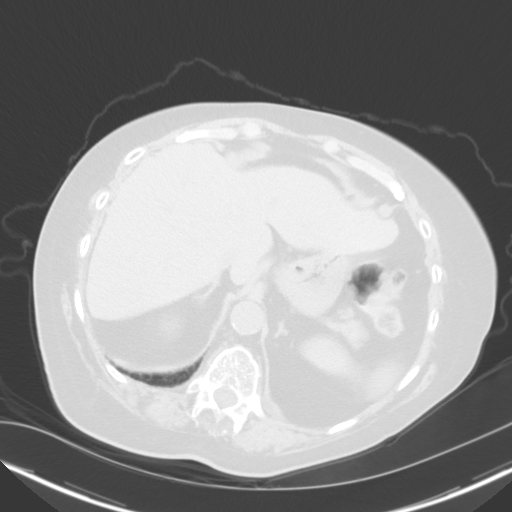
[im 79/91  soft-tissue]
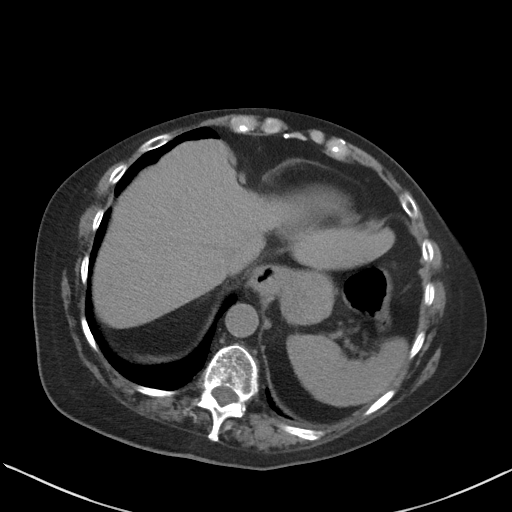
[im 79/91  lung]
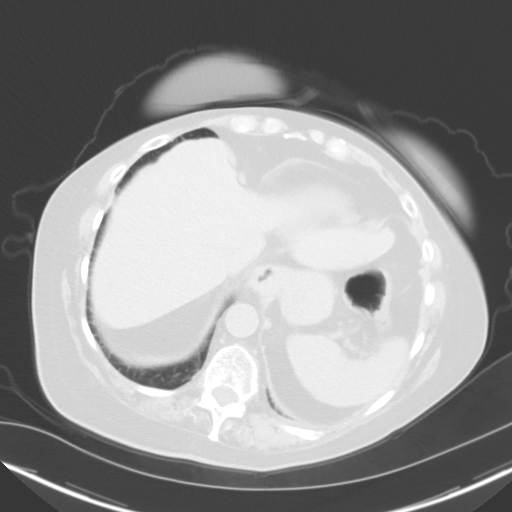
[im 83/91  lung]
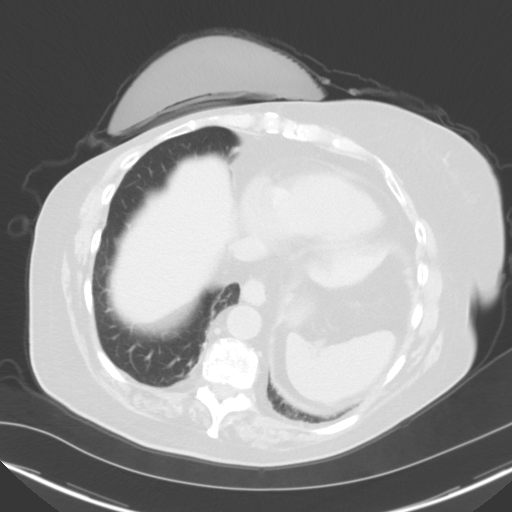
[im 87/91  soft-tissue]
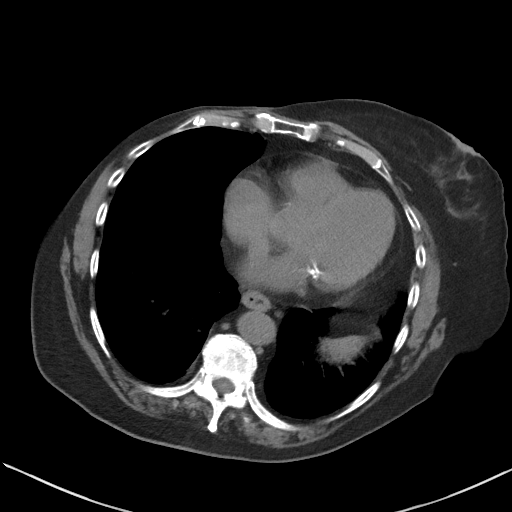
[im 87/91  lung]
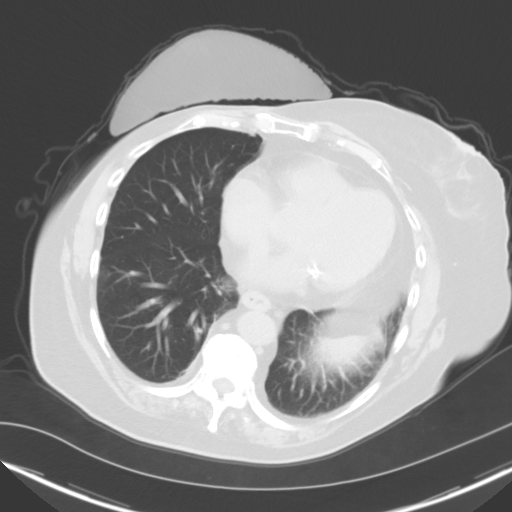

[15 of 32 positions shown; findings below may reference images not displayed]

FINDINGS: Lower chest: Surgical changes from a right mastectomy. The heart is
normal in size. No pericardial effusion. Coronary artery
calcifications are noted. The distal esophagus is unremarkable.
Small hiatal hernia.

Hepatobiliary: No focal hepatic lesions or intrahepatic biliary
dilatation. The gallbladder surgically absent. No common bile duct
dilatation.

Pancreas: No mass, inflammation or ductal dilatation.

Spleen: Normal size.  No focal lesions.

Adrenals/Urinary Tract: Adrenal glands are unremarkable.

No renal, ureteral or bladder calculi or mass without contrast.
Renal cortical scarring changes bilaterally, left greater than right
along with mild renal atrophy. No hydronephrosis. Prominent extra
renal pelves bilaterally.

Small cystocele is noted.

Stomach/Bowel: The stomach, duodenum, small bowel and colon are
unremarkable. No acute inflammatory changes, mass lesions or
obstructive findings. The terminal ileum and appendix are normal.
Scattered colonic diverticulosis without findings for acute
diverticulitis.

Vascular/Lymphatic: Moderate atherosclerotic calcifications
involving the aorta and iliac arteries but no aneurysm. No
mesenteric or retroperitoneal mass or adenopathy.

Reproductive: The uterus is surgically absent. Both ovaries are
still present and appear normal. There is a small simple appearing
cyst associated with the left ovary.

Other: Small periumbilical abdominal wall hernias containing fat.

Musculoskeletal: No significant bony findings. Moderate to advanced
degenerative changes involving the spine.
IMPRESSION: 1. No CT findings to account for the patient's microhematuria. No
renal, ureteral or bladder calculi or mass.
2. Small cystocele.
3. No acute abdominal/pelvic findings, mass lesions or adenopathy.

## 2019-02-06 DIAGNOSIS — I10 Essential (primary) hypertension: Secondary | ICD-10-CM | POA: Diagnosis not present

## 2019-02-06 DIAGNOSIS — E1165 Type 2 diabetes mellitus with hyperglycemia: Secondary | ICD-10-CM | POA: Diagnosis not present

## 2019-02-06 DIAGNOSIS — M1712 Unilateral primary osteoarthritis, left knee: Secondary | ICD-10-CM | POA: Diagnosis not present

## 2019-02-16 ENCOUNTER — Ambulatory Visit: Payer: PPO | Admitting: Student in an Organized Health Care Education/Training Program

## 2019-02-24 DIAGNOSIS — B351 Tinea unguium: Secondary | ICD-10-CM | POA: Diagnosis not present

## 2019-02-24 DIAGNOSIS — M19171 Post-traumatic osteoarthritis, right ankle and foot: Secondary | ICD-10-CM | POA: Diagnosis not present

## 2019-02-24 DIAGNOSIS — L97919 Non-pressure chronic ulcer of unspecified part of right lower leg with unspecified severity: Secondary | ICD-10-CM | POA: Diagnosis not present

## 2019-02-24 DIAGNOSIS — I83019 Varicose veins of right lower extremity with ulcer of unspecified site: Secondary | ICD-10-CM | POA: Diagnosis not present

## 2019-02-24 DIAGNOSIS — M79674 Pain in right toe(s): Secondary | ICD-10-CM | POA: Diagnosis not present

## 2019-02-24 DIAGNOSIS — M79675 Pain in left toe(s): Secondary | ICD-10-CM | POA: Diagnosis not present

## 2019-02-24 DIAGNOSIS — I890 Lymphedema, not elsewhere classified: Secondary | ICD-10-CM | POA: Diagnosis not present

## 2019-02-24 DIAGNOSIS — M25571 Pain in right ankle and joints of right foot: Secondary | ICD-10-CM | POA: Diagnosis not present

## 2019-03-09 ENCOUNTER — Other Ambulatory Visit: Payer: Self-pay | Admitting: Oncology

## 2019-03-30 DIAGNOSIS — E039 Hypothyroidism, unspecified: Secondary | ICD-10-CM | POA: Diagnosis not present

## 2019-03-30 DIAGNOSIS — E119 Type 2 diabetes mellitus without complications: Secondary | ICD-10-CM | POA: Diagnosis not present

## 2019-03-30 DIAGNOSIS — I10 Essential (primary) hypertension: Secondary | ICD-10-CM | POA: Diagnosis not present

## 2019-03-30 DIAGNOSIS — M1712 Unilateral primary osteoarthritis, left knee: Secondary | ICD-10-CM | POA: Diagnosis not present

## 2019-04-03 DIAGNOSIS — M1712 Unilateral primary osteoarthritis, left knee: Secondary | ICD-10-CM | POA: Diagnosis not present

## 2019-04-07 DIAGNOSIS — H353131 Nonexudative age-related macular degeneration, bilateral, early dry stage: Secondary | ICD-10-CM | POA: Diagnosis not present

## 2019-04-20 DIAGNOSIS — R8290 Unspecified abnormal findings in urine: Secondary | ICD-10-CM | POA: Diagnosis not present

## 2019-04-20 DIAGNOSIS — Z853 Personal history of malignant neoplasm of breast: Secondary | ICD-10-CM | POA: Diagnosis not present

## 2019-04-20 DIAGNOSIS — Z87442 Personal history of urinary calculi: Secondary | ICD-10-CM | POA: Diagnosis not present

## 2019-04-20 DIAGNOSIS — E785 Hyperlipidemia, unspecified: Secondary | ICD-10-CM | POA: Diagnosis not present

## 2019-04-20 DIAGNOSIS — M1712 Unilateral primary osteoarthritis, left knee: Secondary | ICD-10-CM | POA: Diagnosis not present

## 2019-04-20 DIAGNOSIS — I890 Lymphedema, not elsewhere classified: Secondary | ICD-10-CM | POA: Diagnosis not present

## 2019-04-20 DIAGNOSIS — E1165 Type 2 diabetes mellitus with hyperglycemia: Secondary | ICD-10-CM | POA: Diagnosis not present

## 2019-04-20 DIAGNOSIS — I10 Essential (primary) hypertension: Secondary | ICD-10-CM | POA: Diagnosis not present

## 2019-04-20 DIAGNOSIS — D649 Anemia, unspecified: Secondary | ICD-10-CM | POA: Diagnosis not present

## 2019-04-22 ENCOUNTER — Telehealth: Payer: Self-pay | Admitting: *Deleted

## 2019-04-22 NOTE — Telephone Encounter (Signed)
Attempted to call patient and left a message for her to return our call.

## 2019-04-22 NOTE — Telephone Encounter (Signed)
Left patient a message to return call and we would answer her questions.

## 2019-04-22 NOTE — Telephone Encounter (Signed)
Patient called back but yall were busy. I told her you would call back later.

## 2019-04-22 NOTE — Telephone Encounter (Signed)
All questions answered

## 2019-04-27 ENCOUNTER — Telehealth (INDEPENDENT_AMBULATORY_CARE_PROVIDER_SITE_OTHER): Payer: Self-pay | Admitting: Vascular Surgery

## 2019-04-27 NOTE — Telephone Encounter (Signed)
Bring her in to be seen .. me or dew

## 2019-04-28 ENCOUNTER — Telehealth (INDEPENDENT_AMBULATORY_CARE_PROVIDER_SITE_OTHER): Payer: Self-pay | Admitting: Nurse Practitioner

## 2019-04-28 DIAGNOSIS — E1165 Type 2 diabetes mellitus with hyperglycemia: Secondary | ICD-10-CM | POA: Diagnosis not present

## 2019-04-28 DIAGNOSIS — Z853 Personal history of malignant neoplasm of breast: Secondary | ICD-10-CM | POA: Diagnosis not present

## 2019-04-28 DIAGNOSIS — D649 Anemia, unspecified: Secondary | ICD-10-CM | POA: Diagnosis not present

## 2019-04-28 DIAGNOSIS — I890 Lymphedema, not elsewhere classified: Secondary | ICD-10-CM | POA: Diagnosis not present

## 2019-04-28 DIAGNOSIS — B9620 Unspecified Escherichia coli [E. coli] as the cause of diseases classified elsewhere: Secondary | ICD-10-CM | POA: Diagnosis not present

## 2019-04-28 DIAGNOSIS — N390 Urinary tract infection, site not specified: Secondary | ICD-10-CM | POA: Diagnosis not present

## 2019-04-28 DIAGNOSIS — E785 Hyperlipidemia, unspecified: Secondary | ICD-10-CM | POA: Diagnosis not present

## 2019-04-28 DIAGNOSIS — M1712 Unilateral primary osteoarthritis, left knee: Secondary | ICD-10-CM | POA: Diagnosis not present

## 2019-04-28 DIAGNOSIS — I10 Essential (primary) hypertension: Secondary | ICD-10-CM | POA: Diagnosis not present

## 2019-04-30 DIAGNOSIS — I872 Venous insufficiency (chronic) (peripheral): Secondary | ICD-10-CM | POA: Diagnosis not present

## 2019-04-30 DIAGNOSIS — I890 Lymphedema, not elsewhere classified: Secondary | ICD-10-CM | POA: Diagnosis not present

## 2019-05-01 DIAGNOSIS — N3000 Acute cystitis without hematuria: Secondary | ICD-10-CM | POA: Diagnosis not present

## 2019-05-01 DIAGNOSIS — R21 Rash and other nonspecific skin eruption: Secondary | ICD-10-CM | POA: Diagnosis not present

## 2019-05-06 ENCOUNTER — Ambulatory Visit: Payer: PPO | Admitting: Student in an Organized Health Care Education/Training Program

## 2019-05-07 DIAGNOSIS — I872 Venous insufficiency (chronic) (peripheral): Secondary | ICD-10-CM | POA: Diagnosis not present

## 2019-05-07 DIAGNOSIS — I890 Lymphedema, not elsewhere classified: Secondary | ICD-10-CM | POA: Diagnosis not present

## 2019-05-08 ENCOUNTER — Other Ambulatory Visit: Payer: Self-pay

## 2019-05-08 ENCOUNTER — Other Ambulatory Visit
Admission: RE | Admit: 2019-05-08 | Discharge: 2019-05-08 | Disposition: A | Discharge status: Home / Self Care | Payer: PPO | Source: Ambulatory Visit | Attending: Student in an Organized Health Care Education/Training Program | Admitting: Student in an Organized Health Care Education/Training Program

## 2019-05-08 DIAGNOSIS — Z1159 Encounter for screening for other viral diseases: Secondary | ICD-10-CM | POA: Insufficient documentation

## 2019-05-09 LAB — NOVEL CORONAVIRUS, NAA (HOSP ORDER, SEND-OUT TO REF LAB; TAT 18-24 HRS): SARS-CoV-2, NAA: NOT DETECTED

## 2019-05-13 ENCOUNTER — Ambulatory Visit (HOSPITAL_BASED_OUTPATIENT_CLINIC_OR_DEPARTMENT_OTHER): Payer: PPO | Admitting: Student in an Organized Health Care Education/Training Program

## 2019-05-13 ENCOUNTER — Ambulatory Visit
Admission: RE | Admit: 2019-05-13 | Discharge: 2019-05-13 | Disposition: A | Discharge status: Home / Self Care | Payer: PPO | Source: Ambulatory Visit | Attending: Student in an Organized Health Care Education/Training Program | Admitting: Student in an Organized Health Care Education/Training Program

## 2019-05-13 ENCOUNTER — Encounter: Payer: Self-pay | Admitting: Student in an Organized Health Care Education/Training Program

## 2019-05-13 ENCOUNTER — Other Ambulatory Visit: Payer: Self-pay

## 2019-05-13 VITALS — BP 133/73 | HR 78 | Temp 98.3°F | Resp 10 | Ht 59.0 in | Wt 136.0 lb

## 2019-05-13 DIAGNOSIS — M25562 Pain in left knee: Secondary | ICD-10-CM | POA: Insufficient documentation

## 2019-05-13 DIAGNOSIS — M1712 Unilateral primary osteoarthritis, left knee: Secondary | ICD-10-CM

## 2019-05-13 DIAGNOSIS — G8929 Other chronic pain: Secondary | ICD-10-CM

## 2019-05-13 MED ORDER — IOHEXOL 180 MG/ML  SOLN: 10.0000 mL | Freq: Once | INTRAMUSCULAR | Status: DC

## 2019-05-13 MED ORDER — ROPIVACAINE HCL 2 MG/ML IJ SOLN: 1.0000 mL | Freq: Once | INTRAMUSCULAR | Status: DC

## 2019-05-13 MED ORDER — DEXAMETHASONE SODIUM PHOSPHATE 10 MG/ML IJ SOLN
INTRAMUSCULAR | Status: AC
  Filled 2019-05-13: qty 1

## 2019-05-13 MED ORDER — ROPIVACAINE HCL 2 MG/ML IJ SOLN
INTRAMUSCULAR | Status: AC
  Filled 2019-05-13: qty 10

## 2019-05-13 MED ORDER — LIDOCAINE HCL 2 % IJ SOLN
20.0000 mL | Freq: Once | INTRAMUSCULAR | Status: AC
  Administered 2019-05-13: 400 mg

## 2019-05-13 MED ORDER — LIDOCAINE HCL 2 % IJ SOLN
INTRAMUSCULAR | Status: AC
  Filled 2019-05-13: qty 20

## 2019-05-13 MED ORDER — DEXAMETHASONE SODIUM PHOSPHATE 10 MG/ML IJ SOLN
10.0000 mg | Freq: Once | INTRAMUSCULAR | Status: AC
  Administered 2019-05-13: 10 mg

## 2019-05-13 NOTE — Progress Notes (Signed)
Patient's Name: [name]  MRN: [id number]  Referring Provider: Tracie Harrier, MD  DOB: [birth year]  PCP: Tracie Harrier, MD  DOS: 05/13/2019  Note by: Gillis Santa, MD  Service setting: Ambulatory outpatient  Specialty: Interventional Pain Management  Patient type: Established  Location: [facility] (AMB) Pain Management Facility  Visit type: Interventional Procedure   Primary Reason for Visit: Interventional Pain Management Treatment. CC: left knee pain  Procedure:          Anesthesia, Analgesia, Anxiolysis:  Type: Genicular Nerves Block (Superior-lateral, Superior-medial, and Inferior-medial Genicular Nerves) #1  CPT: 14782      Primary Purpose: Diagnostic Region: Lateral, Anterior, and Medial aspects of the knee joint, above and below the knee joint proper. Level: Superior and inferior to the knee joint. Target Area: For Genicular Nerve block(s), the targets are: the superior-lateral genicular nerve, located in the lateral distal portion of the femoral shaft as it curves to form the lateral epicondyle, in the region of the distal femoral metaphysis; the superior-medial genicular nerve, located in the medial distal portion of the femoral shaft as it curves to form the medial epicondyle; and the inferior-medial genicular nerve, located in the medial, proximal portion of the tibial shaft, as it curves to form the medial epicondyle, in the region of the proximal tibial metaphysis. Approach: Anterior, percutaneous, ipsilateral approach. Laterality: Left knee  Type: Local Anesthesia  Local Anesthetic: Lidocaine 1-2%  Position: Modified Fowler's position with pillows under the targeted knee(s).   Indications: 1. Chronic pain of left knee   2. Primary osteoarthritis of left knee    Pain Score: Pre-procedure: 7 /10 Post-procedure: 0-No pain/10  Pre-op Assessment:  [name] is a 78 y.o. (year old), female patient, seen today for interventional treatment. She  has a past surgical  history that includes Breast lumpectomy (Right, 1992); Abdominal hysterectomy; Ankle fracture surgery (Right, 2004); Shoulder surgery (Right, 2010); and Mastectomy (Right, 2014). [name] has a current medication list which includes the following prescription(s): acetaminophen, alendronate, allopurinol, amoxicillin, vitamin c, atenolol, atorvastatin, ferrex 150, furosemide, levothyroxine, lisinopril-hydrochlorothiazide, metformin, tamoxifen, and vitamin d (ergocalciferol), and the following Facility-Administered Medications: ropivacaine (pf) 2 mg/ml (0.2%). Her primarily concern today is the Knee Pain (right)  Initial Vital Signs:  Pulse/HCG Rate: 78ECG Heart Rate: 76 Temp: 98.3 F (36.8 C) Resp: 16 BP: 130/76 SpO2: 100 %  BMI: Estimated body mass index is 27.47 kg/m as calculated from the following:   Height as of this encounter: 4\' 11"  (1.499 m).   Weight as of this encounter: 136 lb (61.7 kg).  Risk Assessment: Allergies: Reviewed. She is allergic to other and sulfa antibiotics.  Allergy Precautions: None required Coagulopathies: Reviewed. None identified.  Blood-thinner therapy: None at this time Active Infection(s): Reviewed. None identified. [name] is afebrile  Site Confirmation: [name] was asked to confirm the procedure and laterality before marking the site Procedure checklist: Completed Consent: Before the procedure and under the influence of no sedative(s), amnesic(s), or anxiolytics, the patient was informed of the treatment options, risks and possible complications. To fulfill our ethical and legal obligations, as recommended by the American Medical Association's Code of Ethics, I have informed the patient of my clinical impression; the nature and purpose of the treatment or procedure; the risks, benefits, and possible complications of the intervention; the alternatives, including doing nothing; the risk(s) and benefit(s) of the alternative treatment(s) or  procedure(s); and the risk(s) and benefit(s) of doing nothing. The patient was provided information about the general risks and  possible complications associated with the procedure. These may include, but are not limited to: failure to achieve desired goals, infection, bleeding, organ or nerve damage, allergic reactions, paralysis, and death. In addition, the patient was informed of those risks and complications associated to the procedure, such as failure to decrease pain; infection; bleeding; organ or nerve damage with subsequent damage to sensory, motor, and/or autonomic systems, resulting in permanent pain, numbness, and/or weakness of one or several areas of the body; allergic reactions; (i.e.: anaphylactic reaction); and/or death. Furthermore, the patient was informed of those risks and complications associated with the medications. These include, but are not limited to: allergic reactions (i.e.: anaphylactic or anaphylactoid reaction(s)); adrenal axis suppression; blood sugar elevation that in diabetics may result in ketoacidosis or comma; water retention that in patients with history of congestive heart failure may result in shortness of breath, pulmonary edema, and decompensation with resultant heart failure; weight gain; swelling or edema; medication-induced neural toxicity; particulate matter embolism and blood vessel occlusion with resultant organ, and/or nervous system infarction; and/or aseptic necrosis of one or more joints. Finally, the patient was informed that Medicine is not an exact science; therefore, there is also the possibility of unforeseen or unpredictable risks and/or possible complications that may result in a catastrophic outcome. The patient indicated having understood very clearly. We have given the patient no guarantees and we have made no promises. Enough time was given to the patient to ask questions, all of which were answered to the patient's satisfaction. [name] has  indicated that she wanted to continue with the procedure. Attestation: I, the ordering provider, attest that I have discussed with the patient the benefits, risks, side-effects, alternatives, likelihood of achieving goals, and potential problems during recovery for the procedure that I have provided informed consent. Date   Time: 05/13/2019  8:47 AM  Pre-Procedure Preparation:  Monitoring: As per clinic protocol. Respiration, ETCO2, SpO2, BP, heart rate and rhythm monitor placed and checked for adequate function Safety Precautions: Patient was assessed for positional comfort and pressure points before starting the procedure. Time-out: I initiated and conducted the "Time-out" before starting the procedure, as per protocol. The patient was asked to participate by confirming the accuracy of the "Time Out" information. Verification of the correct person, site, and procedure were performed and confirmed by me, the nursing staff, and the patient. "Time-out" conducted as per Joint Commission's Universal Protocol (UP.01.01.01). Time: 0936  Description of Procedure:          Area Prepped: Entire knee area, from mid-thigh to mid-shin, lateral, anterior, and medial aspects. Prepping solution: DuraPrep (Iodine Povacrylex [0.7% available iodine] and Isopropyl Alcohol, 74% w/w) Safety Precautions: Aspiration looking for blood return was conducted prior to all injections. At no point did we inject any substances, as a needle was being advanced. No attempts were made at seeking any paresthesias. Safe injection practices and needle disposal techniques used. Medications properly checked for expiration dates. SDV (single dose vial) medications used. Description of the Procedure: Protocol guidelines were followed. The patient was placed in position over the procedure table. The target area was identified and the area prepped in the usual manner. Skin & deeper tissues infiltrated with local anesthetic. Appropriate amount of  time allowed to pass for local anesthetics to take effect. The procedure needles were then advanced to the target area. Proper needle placement secured. Negative aspiration confirmed. Solution injected in intermittent fashion, asking for systemic symptoms every 0.5cc of injectate. The needles were then removed and the area  cleansed, making sure to leave some of the prepping solution back to take advantage of its long term bactericidal properties.  Vitals:   05/13/19 0931 05/13/19 0936 05/13/19 0941 05/13/19 0944  BP: 131/68 131/74 (!) 142/82 133/73  Pulse:      Resp: 13 16 18 10   Temp:      SpO2: 99% 97% 100% 99%  Weight:      Height:        Start Time: 0936 hrs. End Time: 0943 hrs. Materials:  Needle(s) Type: Spinal Needle Gauge: 25G Length: 3.5-in Medication(s): Please see orders for medications and dosing details. 6 cc solution made of 5 cc of 0.2% ropivacaine, 1 cc of Decadron 10 mg/cc.  2 cc injected each level above on the left. Imaging Guidance (Non-Spinal):          Type of Imaging Technique: Fluoroscopy Guidance (Non-Spinal) Indication(s): Assistance in needle guidance and placement for procedures requiring needle placement in or near specific anatomical locations not easily accessible without such assistance. Exposure Time: Please see nurses notes. Contrast: None used. Fluoroscopic Guidance: I was personally present during the use of fluoroscopy. "Tunnel Vision Technique" used to obtain the best possible view of the target area. Parallax error corrected before commencing the procedure. "Direction-depth-direction" technique used to introduce the needle under continuous pulsed fluoroscopy. Once target was reached, antero-posterior, oblique, and lateral fluoroscopic projection used confirm needle placement in all planes. Images permanently stored in EMR. Interpretation: No contrast injected. I personally interpreted the imaging intraoperatively. Adequate needle placement confirmed in  multiple planes. Permanent images saved into the patient's record.  Antibiotic Prophylaxis:   Anti-infectives (From admission, onward)   None     Indication(s): None identified  Post-operative Assessment:  Post-procedure Vital Signs:  Pulse/HCG Rate: 7877 Temp: 98.3 F (36.8 C) Resp: 10 BP: 133/73 SpO2: 99 %  EBL: None  Complications: No immediate post-treatment complications observed by team, or reported by patient.  Note: The patient tolerated the entire procedure well. A repeat set of vitals were taken after the procedure and the patient was kept under observation following institutional policy, for this type of procedure. Post-procedural neurological assessment was performed, showing return to baseline, prior to discharge. The patient was provided with post-procedure discharge instructions, including a section on how to identify potential problems. Should any problems arise concerning this procedure, the patient was given instructions to immediately contact us, at any time, without hesitation. In any case, we plan to contact the patient by telephone for a follow-up status report regarding this interventional procedure.  Comments:  No additional relevant information.  Plan of Care  Orders:  Orders Placed This Encounter  Procedures   DG PAIN CLINIC C-ARM 1-60 MIN NO REPORT    Intraoperative interpretation by procedural physician at [facility].    Standing Status:   Standing    Number of Occurrences:   1    Order Specific Question:   Reason for exam:    Answer:   Assistance in needle guidance and placement for procedures requiring needle placement in or near specific anatomical locations not easily accessible without such assistance.   Medications ordered for procedure: Meds ordered this encounter  Medications   DISCONTD: iohexol (OMNIPAQUE) 180 MG/ML injection 10 mL    Must be Myelogram-compatible. If not available, you may substitute with a water-soluble,  non-ionic, hypoallergenic, myelogram-compatible radiological contrast medium.   lidocaine (XYLOCAINE) 2 % (with pres) injection 400 mg   ropivacaine (PF) 2 mg/mL (0.2%) (NAROPIN) injection 1 mL  dexamethasone (DECADRON) injection 10 mg   Medications administered: We administered lidocaine and dexamethasone.  See the medical record for exact dosing, route, and time of administration.  Disposition: Discharge home  Discharge Date & Time: 05/13/2019; 0951 hrs.   Follow-up plan:   Return in about 4 weeks (around 06/10/2019) for Post Procedure Evaluation.     Recent Visits No visits were found meeting these conditions.  Showing recent visits within past 90 days and meeting all other requirements   Today's Visits Date Type Provider Dept  05/13/19 Procedure visit Gillis Santa, MD [facility]  Showing today's visits and meeting all other requirements   Future Appointments Date Type Provider Dept  06/11/19 Appointment Gillis Santa, MD [facility]  Showing future appointments within next 90 days and meeting all other requirements   Primary Care Physician: Tracie Harrier, MD Location: [facility] Note by: Gillis Santa, MD Date: 05/13/2019; Time: 3:41 PM  Disclaimer:  Medicine is not an exact science. The only guarantee in medicine is that nothing is guaranteed. It is important to note that the decision to proceed with this intervention was based on the information collected from the patient. The Data and conclusions were drawn from the patient's questionnaire, the interview, and the physical examination. Because the information was provided in large part by the patient, it cannot be guaranteed that it has not been purposely or unconsciously manipulated. Every effort has been made to obtain as much relevant data as possible for this evaluation. It is important to note that the conclusions that lead to this procedure are derived in large part  from the available data. Always take into account that the treatment will also be dependent on availability of resources and existing treatment guidelines, considered by other Pain Management Practitioners as being common knowledge and practice, at the time of the intervention. For Medico-Legal purposes, it is also important to point out that variation in procedural techniques and pharmacological choices are the acceptable norm. The indications, contraindications, technique, and results of the above procedure should only be interpreted and judged by a Board-Certified Interventional Pain Specialist with extensive familiarity and expertise in the same exact procedure and technique.

## 2019-05-13 NOTE — Patient Instructions (Signed)

## 2019-05-13 NOTE — Progress Notes (Signed)
I was called into the exam room by Gillis Santa, MD to serve as chaperone during the examination. The exam room door was open as I came in and once in, I closed it for privacy. I remained in the room during the entire exam. Once the exam was completed, the exam room door was again opened and left open. Exam conducted in a professional manner with no evidence of any improprieties.

## 2019-05-14 ENCOUNTER — Telehealth: Payer: Self-pay

## 2019-05-14 NOTE — Telephone Encounter (Signed)
Patient was called and no problems reported. 

## 2019-05-28 DIAGNOSIS — I872 Venous insufficiency (chronic) (peripheral): Secondary | ICD-10-CM | POA: Diagnosis not present

## 2019-05-28 DIAGNOSIS — I890 Lymphedema, not elsewhere classified: Secondary | ICD-10-CM | POA: Diagnosis not present

## 2019-06-04 DIAGNOSIS — I83019 Varicose veins of right lower extremity with ulcer of unspecified site: Secondary | ICD-10-CM | POA: Diagnosis not present

## 2019-06-04 DIAGNOSIS — I890 Lymphedema, not elsewhere classified: Secondary | ICD-10-CM | POA: Diagnosis not present

## 2019-06-04 DIAGNOSIS — L03115 Cellulitis of right lower limb: Secondary | ICD-10-CM | POA: Diagnosis not present

## 2019-06-04 DIAGNOSIS — L03116 Cellulitis of left lower limb: Secondary | ICD-10-CM | POA: Diagnosis not present

## 2019-06-04 DIAGNOSIS — M19171 Post-traumatic osteoarthritis, right ankle and foot: Secondary | ICD-10-CM | POA: Diagnosis not present

## 2019-06-04 DIAGNOSIS — L97919 Non-pressure chronic ulcer of unspecified part of right lower leg with unspecified severity: Secondary | ICD-10-CM | POA: Diagnosis not present

## 2019-06-04 DIAGNOSIS — M25571 Pain in right ankle and joints of right foot: Secondary | ICD-10-CM | POA: Diagnosis not present

## 2019-06-10 ENCOUNTER — Encounter: Payer: Self-pay | Admitting: Student in an Organized Health Care Education/Training Program

## 2019-06-11 ENCOUNTER — Encounter: Payer: Self-pay | Admitting: Student in an Organized Health Care Education/Training Program

## 2019-06-11 ENCOUNTER — Ambulatory Visit
Payer: PPO | Attending: Student in an Organized Health Care Education/Training Program | Admitting: Student in an Organized Health Care Education/Training Program

## 2019-06-11 ENCOUNTER — Other Ambulatory Visit: Payer: Self-pay

## 2019-06-11 DIAGNOSIS — G894 Chronic pain syndrome: Secondary | ICD-10-CM | POA: Diagnosis not present

## 2019-06-11 DIAGNOSIS — L03116 Cellulitis of left lower limb: Secondary | ICD-10-CM | POA: Diagnosis not present

## 2019-06-11 DIAGNOSIS — M25562 Pain in left knee: Secondary | ICD-10-CM | POA: Diagnosis not present

## 2019-06-11 DIAGNOSIS — M1712 Unilateral primary osteoarthritis, left knee: Secondary | ICD-10-CM

## 2019-06-11 DIAGNOSIS — I890 Lymphedema, not elsewhere classified: Secondary | ICD-10-CM | POA: Diagnosis not present

## 2019-06-11 DIAGNOSIS — L03115 Cellulitis of right lower limb: Secondary | ICD-10-CM | POA: Diagnosis not present

## 2019-06-11 DIAGNOSIS — I739 Peripheral vascular disease, unspecified: Secondary | ICD-10-CM

## 2019-06-11 DIAGNOSIS — I872 Venous insufficiency (chronic) (peripheral): Secondary | ICD-10-CM | POA: Diagnosis not present

## 2019-06-11 DIAGNOSIS — G8929 Other chronic pain: Secondary | ICD-10-CM | POA: Diagnosis not present

## 2019-06-11 NOTE — Progress Notes (Signed)
Pain Management Virtual Encounter Note - Virtual Visit via Telephone Telehealth (real-time audio visits between healthcare provider and patient).   Patient's Phone No. & Preferred Pharmacy:  [phone] (home); There is no such number on file (mobile).; (Preferred) [phone] No e-mail address on record  [place], [place] - [address] [place] [address] Phone: [phone] Fax: [phone]    Pre-screening note:  Our staff contacted Ms. Pyles and offered her an "in person", "face-to-face" appointment versus a telephone encounter. She indicated preferring the telephone encounter, at this time.   Reason for Virtual Visit: COVID-19*  Social distancing based on CDC and AMA recommendations.   I contacted [name] on 06/11/2019 via telephone.      I clearly identified myself as Gillis Santa, MD. I verified that I was speaking with the correct person using two identifiers (Name: CABRINI RUGGIERI, and date of birth: 01/10/1941).  Advanced Informed Consent I sought verbal advanced consent from [name] for virtual visit interactions. I informed Ms. Haley of possible security and privacy concerns, risks, and limitations associated with providing "not-in-person" medical evaluation and management services. I also informed Ms. Wernick of the availability of "in-person" appointments. Finally, I informed her that there would be a charge for the virtual visit and that she could be  personally, fully or partially, financially responsible for it. Ms. Pickert expressed understanding and agreed to proceed.   Historic Elements   Ms. ELLINA SIVERTSEN is a 78 y.o. year old, female patient evaluated today after her last encounter by our practice on 05/14/2019. Ms. Kirley  has a past medical history of Arthritis, Arthritis, Breast cancer ([place]) (1992), Cancer of right breast ([place]) (04/16/2013), Diabetes mellitus without  complication ([place]), Gout, High cholesterol, Hyperlipidemia, Hypertension, and Personal history of radiation therapy. She also  has a past surgical history that includes Breast lumpectomy (Right, 1992); Abdominal hysterectomy; Ankle fracture surgery (Right, 2004); Shoulder surgery (Right, 2010); and Mastectomy (Right, 2014). Ms. Wence has a current medication list which includes the following prescription(s): acetaminophen, alendronate, allopurinol, vitamin c, atenolol, atorvastatin, ferrex 150, furosemide, levothyroxine, lisinopril-hydrochlorothiazide, metformin, tamoxifen, vitamin d (ergocalciferol), and amoxicillin. She  reports that she has never smoked. She has never used smokeless tobacco. She reports that she does not drink alcohol or use drugs. Ms. Ramaswamy is allergic to other and sulfa antibiotics.   HPI  Today, she is being contacted for a post-procedure assessment.  Evaluation of last interventional procedure  05/13/2019 Procedure: LEFT KNEE GNB Pre-procedure pain score:  7/10 Post-procedure pain score: 0/10         Influential Factors: Intra-procedural challenges: None observed.         Reported side-effects: None.        Post-procedural adverse reactions or complications: None reported         Sedation: Please see nurses note for DOS. When no sedatives are used, the analgesic levels obtained are directly associated to the effectiveness of the local anesthetics. However, when sedation is provided, the level of analgesia obtained during the initial 1 hour following the intervention, is believed to be the result of a combination of factors. These factors may include, but are not limited to: 1. The effectiveness of the local anesthetics used. 2. The effects of the analgesic(s) and/or anxiolytic(s) used. 3. The degree of discomfort experienced by the patient at the time of the procedure. 4. The patients ability and reliability in recalling and recording the events. 5. The presence  and  influence of possible secondary gains and/or psychosocial factors. Reported result: Relief experienced during the 1st hour after the procedure:100%   (Ultra-Short Term Relief)            Interpretative annotation: Clinically appropriate result. Analgesia during this period is likely to be Local Anesthetic and/or IV Sedative (Analgesic/Anxiolytic) related.          Effects of local anesthetic: The analgesic effects attained during this period are directly associated to the localized infiltration of local anesthetics and therefore cary significant diagnostic value as to the etiological location, or anatomical origin, of the pain. Expected duration of relief is directly dependent on the pharmacodynamics of the local anesthetic used. Long-acting (4-6 hours) anesthetics used.  Reported result: Relief during the next 4 to 6 hour after the procedure: 100%   (Short-Term Relief)            Interpretative annotation: Clinically appropriate result. Analgesia during this period is likely to be Local Anesthetic-related.          Long-term benefit: Defined as the period of time past the expected duration of local anesthetics (1 hour for short-acting and 4-6 hours for long-acting). With the possible exception of prolonged sympathetic blockade from the local anesthetics, benefits during this period are typically attributed to, or associated with, other factors such as analgesic sensory neuropraxia, antiinflammatory effects, or beneficial biochemical changes provided by agents other than the local anesthetics.  Reported result: Extended relief following procedure: 30-35% for 3-4 days after procedure   (Long-Term Relief)            Interpretative annotation: Clinically appropriate result. Good relief. No permanent benefit expected. Inflammation plays a part in the etiology to the pain.          Pertinent Labs   SAFETY SCREENING Profile Lab Results  Component Value Date   SARSCOV2NAA NOT DETECTED 05/08/2019    COVIDSOURCE NASOPHARYNGEAL 05/08/2019   Renal Function Lab Results  Component Value Date   BUN 44 (H) 12/22/2018   CREATININE 1.14 (H) 12/22/2018   BCR 31 (H) 09/08/2018   GFRAA 54 (L) 12/22/2018   GFRNONAA 47 (L) 12/22/2018   Hepatic Function Lab Results  Component Value Date   AST 23 12/22/2018   ALT 15 12/22/2018   ALBUMIN 3.8 12/22/2018   UDS No results found for: SUMMARY Note: Above Lab results reviewed.  Recent imaging  DG PAIN CLINIC C-ARM 1-60 MIN NO REPORT Fluoro was used, but no Radiologist interpretation will be provided.  Please refer to "NOTES" tab for provider progress note.  Assessment  The primary encounter diagnosis was Primary osteoarthritis of left knee. Diagnoses of PAD (peripheral artery disease) ([place]), Chronic pain of left knee, and Chronic pain syndrome were also pertinent to this visit.  Plan of Care  I am having [name] maintain her atenolol, Vitamin D (Ergocalciferol), alendronate, atorvastatin, lisinopril-hydrochlorothiazide, levothyroxine, metFORMIN, furosemide, vitamin C, allopurinol, Ferrex 150, acetaminophen, amoxicillin, and tamoxifen.  Orders:  Orders Placed This Encounter  Procedures  . Radiofrequency,Genicular    For knee pain.    Standing Status:   Standing    Number of Occurrences:   1    Standing Expiration Date:   12/11/2020    Scheduling Instructions:     Side(s): LEFT     Level(s): Superior-Lateral, Superior-Medial, and Inferior-Medial Genicular Nerve(s)     Sedation: With Sedation     TIMEFRAME: PRN procedure. (Ms. Foley will call when needed.)    Order Specific Question:  Where will this procedure be performed?    Answer:   [facility]   Follow-up plan:   Return L GN RFA PRN.     s/p LEFT GNB 7/1, helped moderately for a short term, PRN genicular RFA   Recent Visits Date Type Provider Dept  05/13/19 Procedure visit Gillis Santa, MD [facility]  Showing recent visits within past 90  days and meeting all other requirements   Today's Visits Date Type Provider Dept  06/11/19 Office Visit Gillis Santa, MD [facility]  Showing today's visits and meeting all other requirements   Future Appointments No visits were found meeting these conditions.  Showing future appointments within next 90 days and meeting all other requirements   I discussed the assessment and treatment plan with the patient. The patient was provided an opportunity to ask questions and all were answered. The patient agreed with the plan and demonstrated an understanding of the instructions.  Patient advised to call back or seek an in-person evaluation if the symptoms or condition worsens.  Total duration of non-face-to-face encounter: 25 minutes.  Note by: Gillis Santa, MD Date: 06/11/2019; Time: 1:26 PM  Note: This dictation was prepared with Dragon dictation. Any transcriptional errors that may result from this process are unintentional.  Disclaimer:  * Given the special circumstances of the COVID-19 pandemic, the federal government has announced that the Office for Civil Rights (OCR) will exercise its enforcement discretion and will not impose penalties on physicians using telehealth in the event of noncompliance with regulatory requirements under the [place] (HIPAA) in connection with the good faith provision of telehealth during the HFSFS-23 national public health emergency. ([place])

## 2019-06-17 DIAGNOSIS — L03115 Cellulitis of right lower limb: Secondary | ICD-10-CM | POA: Diagnosis not present

## 2019-06-17 DIAGNOSIS — I890 Lymphedema, not elsewhere classified: Secondary | ICD-10-CM | POA: Diagnosis not present

## 2019-06-17 DIAGNOSIS — L03116 Cellulitis of left lower limb: Secondary | ICD-10-CM | POA: Diagnosis not present

## 2019-07-21 DIAGNOSIS — M1A00X0 Idiopathic chronic gout, unspecified site, without tophus (tophi): Secondary | ICD-10-CM | POA: Diagnosis not present

## 2019-07-21 DIAGNOSIS — E119 Type 2 diabetes mellitus without complications: Secondary | ICD-10-CM | POA: Diagnosis not present

## 2019-07-21 DIAGNOSIS — M1712 Unilateral primary osteoarthritis, left knee: Secondary | ICD-10-CM | POA: Diagnosis not present

## 2019-08-31 DIAGNOSIS — B9620 Unspecified Escherichia coli [E. coli] as the cause of diseases classified elsewhere: Secondary | ICD-10-CM | POA: Diagnosis not present

## 2019-08-31 DIAGNOSIS — Z853 Personal history of malignant neoplasm of breast: Secondary | ICD-10-CM | POA: Diagnosis not present

## 2019-08-31 DIAGNOSIS — I890 Lymphedema, not elsewhere classified: Secondary | ICD-10-CM | POA: Diagnosis not present

## 2019-08-31 DIAGNOSIS — M1712 Unilateral primary osteoarthritis, left knee: Secondary | ICD-10-CM | POA: Diagnosis not present

## 2019-08-31 DIAGNOSIS — E1165 Type 2 diabetes mellitus with hyperglycemia: Secondary | ICD-10-CM | POA: Diagnosis not present

## 2019-08-31 DIAGNOSIS — Z79899 Other long term (current) drug therapy: Secondary | ICD-10-CM | POA: Diagnosis not present

## 2019-08-31 DIAGNOSIS — E785 Hyperlipidemia, unspecified: Secondary | ICD-10-CM | POA: Diagnosis not present

## 2019-08-31 DIAGNOSIS — D649 Anemia, unspecified: Secondary | ICD-10-CM | POA: Diagnosis not present

## 2019-08-31 DIAGNOSIS — N390 Urinary tract infection, site not specified: Secondary | ICD-10-CM | POA: Diagnosis not present

## 2019-09-02 DIAGNOSIS — I10 Essential (primary) hypertension: Secondary | ICD-10-CM | POA: Diagnosis not present

## 2019-09-02 DIAGNOSIS — R600 Localized edema: Secondary | ICD-10-CM | POA: Diagnosis not present

## 2019-09-02 DIAGNOSIS — I890 Lymphedema, not elsewhere classified: Secondary | ICD-10-CM | POA: Diagnosis not present

## 2019-09-02 DIAGNOSIS — R5383 Other fatigue: Secondary | ICD-10-CM | POA: Diagnosis not present

## 2019-09-02 DIAGNOSIS — E785 Hyperlipidemia, unspecified: Secondary | ICD-10-CM | POA: Diagnosis not present

## 2019-09-02 DIAGNOSIS — E1165 Type 2 diabetes mellitus with hyperglycemia: Secondary | ICD-10-CM | POA: Diagnosis not present

## 2019-09-02 DIAGNOSIS — Z853 Personal history of malignant neoplasm of breast: Secondary | ICD-10-CM | POA: Diagnosis not present

## 2019-09-02 DIAGNOSIS — R5381 Other malaise: Secondary | ICD-10-CM | POA: Diagnosis not present

## 2019-09-02 DIAGNOSIS — N390 Urinary tract infection, site not specified: Secondary | ICD-10-CM | POA: Diagnosis not present

## 2019-09-02 DIAGNOSIS — E039 Hypothyroidism, unspecified: Secondary | ICD-10-CM | POA: Diagnosis not present

## 2019-09-02 DIAGNOSIS — D649 Anemia, unspecified: Secondary | ICD-10-CM | POA: Diagnosis not present

## 2019-09-02 DIAGNOSIS — M1712 Unilateral primary osteoarthritis, left knee: Secondary | ICD-10-CM | POA: Diagnosis not present

## 2019-09-02 DIAGNOSIS — B9620 Unspecified Escherichia coli [E. coli] as the cause of diseases classified elsewhere: Secondary | ICD-10-CM | POA: Diagnosis not present

## 2019-09-02 DIAGNOSIS — Z79899 Other long term (current) drug therapy: Secondary | ICD-10-CM | POA: Diagnosis not present

## 2019-09-09 DIAGNOSIS — M1A00X0 Idiopathic chronic gout, unspecified site, without tophus (tophi): Secondary | ICD-10-CM | POA: Diagnosis not present

## 2019-09-09 DIAGNOSIS — E790 Hyperuricemia without signs of inflammatory arthritis and tophaceous disease: Secondary | ICD-10-CM | POA: Diagnosis not present

## 2019-09-09 DIAGNOSIS — M159 Polyosteoarthritis, unspecified: Secondary | ICD-10-CM | POA: Diagnosis not present

## 2019-09-09 DIAGNOSIS — R600 Localized edema: Secondary | ICD-10-CM | POA: Diagnosis not present

## 2019-09-09 DIAGNOSIS — M1712 Unilateral primary osteoarthritis, left knee: Secondary | ICD-10-CM | POA: Diagnosis not present

## 2019-09-21 DIAGNOSIS — L03116 Cellulitis of left lower limb: Secondary | ICD-10-CM | POA: Diagnosis not present

## 2019-09-21 DIAGNOSIS — I83019 Varicose veins of right lower extremity with ulcer of unspecified site: Secondary | ICD-10-CM | POA: Diagnosis not present

## 2019-09-21 DIAGNOSIS — L03115 Cellulitis of right lower limb: Secondary | ICD-10-CM | POA: Diagnosis not present

## 2019-09-21 DIAGNOSIS — I890 Lymphedema, not elsewhere classified: Secondary | ICD-10-CM | POA: Diagnosis not present

## 2019-09-21 DIAGNOSIS — L97919 Non-pressure chronic ulcer of unspecified part of right lower leg with unspecified severity: Secondary | ICD-10-CM | POA: Diagnosis not present

## 2019-09-28 DIAGNOSIS — M25562 Pain in left knee: Secondary | ICD-10-CM | POA: Diagnosis not present

## 2019-09-28 DIAGNOSIS — L03116 Cellulitis of left lower limb: Secondary | ICD-10-CM | POA: Diagnosis not present

## 2019-09-28 DIAGNOSIS — M25561 Pain in right knee: Secondary | ICD-10-CM | POA: Diagnosis not present

## 2019-09-28 DIAGNOSIS — M79674 Pain in right toe(s): Secondary | ICD-10-CM | POA: Diagnosis not present

## 2019-09-28 DIAGNOSIS — L97919 Non-pressure chronic ulcer of unspecified part of right lower leg with unspecified severity: Secondary | ICD-10-CM | POA: Diagnosis not present

## 2019-09-28 DIAGNOSIS — I890 Lymphedema, not elsewhere classified: Secondary | ICD-10-CM | POA: Diagnosis not present

## 2019-09-28 DIAGNOSIS — L03115 Cellulitis of right lower limb: Secondary | ICD-10-CM | POA: Diagnosis not present

## 2019-09-28 DIAGNOSIS — M79675 Pain in left toe(s): Secondary | ICD-10-CM | POA: Diagnosis not present

## 2019-09-28 DIAGNOSIS — I83019 Varicose veins of right lower extremity with ulcer of unspecified site: Secondary | ICD-10-CM | POA: Diagnosis not present

## 2019-09-28 DIAGNOSIS — G8929 Other chronic pain: Secondary | ICD-10-CM | POA: Diagnosis not present

## 2019-09-28 DIAGNOSIS — B351 Tinea unguium: Secondary | ICD-10-CM | POA: Diagnosis not present

## 2019-09-28 DIAGNOSIS — I872 Venous insufficiency (chronic) (peripheral): Secondary | ICD-10-CM | POA: Diagnosis not present

## 2019-09-29 DIAGNOSIS — M25561 Pain in right knee: Secondary | ICD-10-CM | POA: Diagnosis not present

## 2019-09-29 DIAGNOSIS — M1712 Unilateral primary osteoarthritis, left knee: Secondary | ICD-10-CM | POA: Diagnosis not present

## 2019-10-01 DIAGNOSIS — M542 Cervicalgia: Secondary | ICD-10-CM | POA: Diagnosis not present

## 2019-10-01 DIAGNOSIS — M4316 Spondylolisthesis, lumbar region: Secondary | ICD-10-CM | POA: Diagnosis not present

## 2019-10-01 DIAGNOSIS — R52 Pain, unspecified: Secondary | ICD-10-CM | POA: Diagnosis not present

## 2019-10-01 DIAGNOSIS — M546 Pain in thoracic spine: Secondary | ICD-10-CM | POA: Diagnosis not present

## 2019-10-01 DIAGNOSIS — M5489 Other dorsalgia: Secondary | ICD-10-CM | POA: Diagnosis not present

## 2019-10-01 DIAGNOSIS — M549 Dorsalgia, unspecified: Secondary | ICD-10-CM | POA: Diagnosis not present

## 2019-10-05 DIAGNOSIS — I83019 Varicose veins of right lower extremity with ulcer of unspecified site: Secondary | ICD-10-CM | POA: Diagnosis not present

## 2019-10-05 DIAGNOSIS — L03116 Cellulitis of left lower limb: Secondary | ICD-10-CM | POA: Diagnosis not present

## 2019-10-05 DIAGNOSIS — L97919 Non-pressure chronic ulcer of unspecified part of right lower leg with unspecified severity: Secondary | ICD-10-CM | POA: Diagnosis not present

## 2019-10-05 DIAGNOSIS — L03115 Cellulitis of right lower limb: Secondary | ICD-10-CM | POA: Diagnosis not present

## 2019-10-06 DIAGNOSIS — D649 Anemia, unspecified: Secondary | ICD-10-CM | POA: Diagnosis not present

## 2019-10-06 DIAGNOSIS — E1165 Type 2 diabetes mellitus with hyperglycemia: Secondary | ICD-10-CM | POA: Diagnosis not present

## 2019-10-06 DIAGNOSIS — Z853 Personal history of malignant neoplasm of breast: Secondary | ICD-10-CM | POA: Diagnosis not present

## 2019-10-06 DIAGNOSIS — I10 Essential (primary) hypertension: Secondary | ICD-10-CM | POA: Diagnosis not present

## 2019-10-06 DIAGNOSIS — M1712 Unilateral primary osteoarthritis, left knee: Secondary | ICD-10-CM | POA: Diagnosis not present

## 2019-10-06 DIAGNOSIS — R600 Localized edema: Secondary | ICD-10-CM | POA: Diagnosis not present

## 2019-10-06 DIAGNOSIS — E039 Hypothyroidism, unspecified: Secondary | ICD-10-CM | POA: Diagnosis not present

## 2019-10-12 DIAGNOSIS — L03115 Cellulitis of right lower limb: Secondary | ICD-10-CM | POA: Diagnosis not present

## 2019-10-12 DIAGNOSIS — L03116 Cellulitis of left lower limb: Secondary | ICD-10-CM | POA: Diagnosis not present

## 2019-10-12 DIAGNOSIS — I83019 Varicose veins of right lower extremity with ulcer of unspecified site: Secondary | ICD-10-CM | POA: Diagnosis not present

## 2019-10-12 DIAGNOSIS — L97919 Non-pressure chronic ulcer of unspecified part of right lower leg with unspecified severity: Secondary | ICD-10-CM | POA: Diagnosis not present

## 2019-10-20 DIAGNOSIS — M47816 Spondylosis without myelopathy or radiculopathy, lumbar region: Secondary | ICD-10-CM | POA: Diagnosis not present

## 2019-10-20 DIAGNOSIS — M1712 Unilateral primary osteoarthritis, left knee: Secondary | ICD-10-CM | POA: Diagnosis not present

## 2019-11-09 DIAGNOSIS — M5416 Radiculopathy, lumbar region: Secondary | ICD-10-CM | POA: Diagnosis not present

## 2019-11-09 DIAGNOSIS — M5136 Other intervertebral disc degeneration, lumbar region: Secondary | ICD-10-CM | POA: Diagnosis not present

## 2019-11-16 DIAGNOSIS — L03115 Cellulitis of right lower limb: Secondary | ICD-10-CM | POA: Diagnosis not present

## 2019-11-16 DIAGNOSIS — L03116 Cellulitis of left lower limb: Secondary | ICD-10-CM | POA: Diagnosis not present

## 2019-11-16 DIAGNOSIS — I83019 Varicose veins of right lower extremity with ulcer of unspecified site: Secondary | ICD-10-CM | POA: Diagnosis not present

## 2019-11-16 DIAGNOSIS — I890 Lymphedema, not elsewhere classified: Secondary | ICD-10-CM | POA: Diagnosis not present

## 2019-11-16 DIAGNOSIS — L97919 Non-pressure chronic ulcer of unspecified part of right lower leg with unspecified severity: Secondary | ICD-10-CM | POA: Diagnosis not present

## 2019-11-18 DIAGNOSIS — M5416 Radiculopathy, lumbar region: Secondary | ICD-10-CM | POA: Diagnosis not present

## 2019-11-18 DIAGNOSIS — M48062 Spinal stenosis, lumbar region with neurogenic claudication: Secondary | ICD-10-CM | POA: Diagnosis not present

## 2019-11-18 DIAGNOSIS — M5136 Other intervertebral disc degeneration, lumbar region: Secondary | ICD-10-CM | POA: Diagnosis not present

## 2019-11-19 ENCOUNTER — Other Ambulatory Visit: Payer: Self-pay | Admitting: Surgery

## 2019-11-19 DIAGNOSIS — Z1231 Encounter for screening mammogram for malignant neoplasm of breast: Secondary | ICD-10-CM

## 2019-11-23 ENCOUNTER — Other Ambulatory Visit: Payer: Self-pay | Admitting: Oncology

## 2019-11-23 DIAGNOSIS — L03116 Cellulitis of left lower limb: Secondary | ICD-10-CM | POA: Diagnosis not present

## 2019-11-23 DIAGNOSIS — I890 Lymphedema, not elsewhere classified: Secondary | ICD-10-CM | POA: Diagnosis not present

## 2019-11-23 DIAGNOSIS — M19171 Post-traumatic osteoarthritis, right ankle and foot: Secondary | ICD-10-CM | POA: Diagnosis not present

## 2019-11-23 DIAGNOSIS — E1151 Type 2 diabetes mellitus with diabetic peripheral angiopathy without gangrene: Secondary | ICD-10-CM | POA: Diagnosis not present

## 2019-11-23 DIAGNOSIS — I83019 Varicose veins of right lower extremity with ulcer of unspecified site: Secondary | ICD-10-CM | POA: Diagnosis not present

## 2019-11-23 DIAGNOSIS — L97919 Non-pressure chronic ulcer of unspecified part of right lower leg with unspecified severity: Secondary | ICD-10-CM | POA: Diagnosis not present

## 2019-11-30 DIAGNOSIS — L97919 Non-pressure chronic ulcer of unspecified part of right lower leg with unspecified severity: Secondary | ICD-10-CM | POA: Diagnosis not present

## 2019-11-30 DIAGNOSIS — I890 Lymphedema, not elsewhere classified: Secondary | ICD-10-CM | POA: Diagnosis not present

## 2019-11-30 DIAGNOSIS — I83019 Varicose veins of right lower extremity with ulcer of unspecified site: Secondary | ICD-10-CM | POA: Diagnosis not present

## 2019-11-30 DIAGNOSIS — E1151 Type 2 diabetes mellitus with diabetic peripheral angiopathy without gangrene: Secondary | ICD-10-CM | POA: Diagnosis not present

## 2019-12-08 DIAGNOSIS — E1151 Type 2 diabetes mellitus with diabetic peripheral angiopathy without gangrene: Secondary | ICD-10-CM | POA: Diagnosis not present

## 2019-12-08 DIAGNOSIS — I890 Lymphedema, not elsewhere classified: Secondary | ICD-10-CM | POA: Diagnosis not present

## 2019-12-08 DIAGNOSIS — I83019 Varicose veins of right lower extremity with ulcer of unspecified site: Secondary | ICD-10-CM | POA: Diagnosis not present

## 2019-12-08 DIAGNOSIS — L97919 Non-pressure chronic ulcer of unspecified part of right lower leg with unspecified severity: Secondary | ICD-10-CM | POA: Diagnosis not present

## 2019-12-09 DIAGNOSIS — M5416 Radiculopathy, lumbar region: Secondary | ICD-10-CM | POA: Diagnosis not present

## 2019-12-09 DIAGNOSIS — M48062 Spinal stenosis, lumbar region with neurogenic claudication: Secondary | ICD-10-CM | POA: Diagnosis not present

## 2019-12-09 DIAGNOSIS — M5136 Other intervertebral disc degeneration, lumbar region: Secondary | ICD-10-CM | POA: Diagnosis not present

## 2019-12-22 DIAGNOSIS — M47812 Spondylosis without myelopathy or radiculopathy, cervical region: Secondary | ICD-10-CM | POA: Diagnosis not present

## 2019-12-24 ENCOUNTER — Encounter: Payer: Self-pay | Admitting: Oncology

## 2019-12-24 ENCOUNTER — Inpatient Hospital Stay: Payer: PPO | Attending: Oncology | Admitting: Oncology

## 2019-12-24 ENCOUNTER — Other Ambulatory Visit: Payer: Self-pay

## 2019-12-24 VITALS — BP 90/54 | HR 77 | Temp 98.7°F | Resp 16 | Wt 134.0 lb

## 2019-12-24 DIAGNOSIS — Z170 Estrogen receptor positive status [ER+]: Secondary | ICD-10-CM | POA: Insufficient documentation

## 2019-12-24 DIAGNOSIS — Z5181 Encounter for therapeutic drug level monitoring: Secondary | ICD-10-CM

## 2019-12-24 DIAGNOSIS — C50911 Malignant neoplasm of unspecified site of right female breast: Secondary | ICD-10-CM | POA: Insufficient documentation

## 2019-12-24 DIAGNOSIS — Z08 Encounter for follow-up examination after completed treatment for malignant neoplasm: Secondary | ICD-10-CM | POA: Diagnosis not present

## 2019-12-24 DIAGNOSIS — I10 Essential (primary) hypertension: Secondary | ICD-10-CM | POA: Insufficient documentation

## 2019-12-24 DIAGNOSIS — E7800 Pure hypercholesterolemia, unspecified: Secondary | ICD-10-CM | POA: Insufficient documentation

## 2019-12-24 DIAGNOSIS — Z90710 Acquired absence of both cervix and uterus: Secondary | ICD-10-CM | POA: Insufficient documentation

## 2019-12-24 DIAGNOSIS — I890 Lymphedema, not elsewhere classified: Secondary | ICD-10-CM | POA: Insufficient documentation

## 2019-12-24 DIAGNOSIS — Z853 Personal history of malignant neoplasm of breast: Secondary | ICD-10-CM

## 2019-12-24 DIAGNOSIS — E119 Type 2 diabetes mellitus without complications: Secondary | ICD-10-CM | POA: Diagnosis not present

## 2019-12-24 DIAGNOSIS — E785 Hyperlipidemia, unspecified: Secondary | ICD-10-CM | POA: Insufficient documentation

## 2019-12-24 DIAGNOSIS — Z79810 Long term (current) use of selective estrogen receptor modulators (SERMs): Secondary | ICD-10-CM | POA: Diagnosis not present

## 2019-12-24 DIAGNOSIS — Z7984 Long term (current) use of oral hypoglycemic drugs: Secondary | ICD-10-CM | POA: Diagnosis not present

## 2019-12-24 DIAGNOSIS — Z79899 Other long term (current) drug therapy: Secondary | ICD-10-CM | POA: Diagnosis not present

## 2019-12-24 DIAGNOSIS — Z9011 Acquired absence of right breast and nipple: Secondary | ICD-10-CM | POA: Insufficient documentation

## 2019-12-24 DIAGNOSIS — Z923 Personal history of irradiation: Secondary | ICD-10-CM | POA: Insufficient documentation

## 2019-12-24 DIAGNOSIS — M810 Age-related osteoporosis without current pathological fracture: Secondary | ICD-10-CM | POA: Diagnosis not present

## 2019-12-27 NOTE — Progress Notes (Addendum)
Hematology/Oncology Consult note [facility]  [phone] [phone]  Patient Care Team: Tracie Harrier, MD as PCP - General (Internal Medicine)   Name of the patient: [name]  [id number]  03-13-1941   Date of visit: 12/27/19  Diagnosis-  rightbreast cancer stage I [id number] diagnosed in January 2014 ER/PR positive and HER-2/neu negative  Chief complaint/ Reason for visit- routine f/u of breast cancer on tamoxifen  Heme/Onc history:  Patient is a 79 year old female who was initially diagnosed with right breast cancer in 1992 status post lumpectomy and axillary lymph node dissection. She received adjuvant radiation therapy followed by 5 years of tamoxifen. In January 2014 she was diagnosed with second primary right breast cancer and underwent a mastectomy at that time. Oncotype score came back at low risk and patient did not require adjuvant chemotherapy. She could not tolerate AI and was started on tamoxifen for hormone therapy. She also has osteoporosis for which she takes vitamin D and Fosamax  Recent bone density scan from July 2018 revealed a T score of -1.8 in the left forearm, -1.5 with a left femoral neck. 10-year probability of a major hip fracture was 2.8% and that of a major osteoporotic fracture was 16%   Interval history- Patient continues to have issues with b/l lymphedema and needs to wear compression stocking. She just came off her una boots  ECOG PS- 2 Pain scale- 2  Review of systems- Review of Systems  Constitutional: Positive for malaise/fatigue. Negative for chills, fever and weight loss.  HENT: Negative for congestion, ear discharge and nosebleeds.   Eyes: Negative for blurred vision.  Respiratory: Negative for cough, hemoptysis, sputum production, shortness of breath and wheezing.   Cardiovascular: Positive for leg swelling. Negative for chest pain, palpitations, orthopnea and claudication.   Gastrointestinal: Negative for abdominal pain, blood in stool, constipation, diarrhea, heartburn, melena, nausea and vomiting.  Genitourinary: Negative for dysuria, flank pain, frequency, hematuria and urgency.  Musculoskeletal: Negative for back pain, joint pain and myalgias.  Skin: Negative for rash.  Neurological: Negative for dizziness, tingling, focal weakness, seizures, weakness and headaches.  Endo/Heme/Allergies: Does not bruise/bleed easily.  Psychiatric/Behavioral: Negative for depression and suicidal ideas. The patient does not have insomnia.       Allergies  Allergen Reactions  . Other Anaphylaxis    Anesthisia has been a health issue for her in the past.   . Sulfa Antibiotics Rash     Past Medical History:  Diagnosis Date  . Arthritis   . Arthritis   . Breast cancer ([place]) 1992   right breast with lumpectomy and rad tx  . Cancer of right breast ([place]) 04/16/2013   right breast with mastectomy  . Diabetes mellitus without complication ([place])   . Gout   . High cholesterol   . Hyperlipidemia   . Hypertension   . Personal history of radiation therapy      Past Surgical History:  Procedure Laterality Date  . ABDOMINAL HYSTERECTOMY    . ANKLE FRACTURE SURGERY Right 2004  . BREAST LUMPECTOMY Right 1992   positive  . MASTECTOMY Right 2014  . SHOULDER SURGERY Right 2010    Social History   Socioeconomic History  . Marital status: Married    Spouse name: Not on file  . Number of children: 2  . Years of education: Not on file  . Highest education level: Not on file  Occupational History  . Not on file  Tobacco Use  . Smoking  status: Never Smoker  . Smokeless tobacco: Never Used  Substance and Sexual Activity  . Alcohol use: No    Alcohol/week: 0.0 standard drinks  . Drug use: No  . Sexual activity: Not Currently  Other Topics Concern  . Not on file  Social History Narrative  . Not on file   Social Determinants of Health   Financial Resource Strain:    . Difficulty of Paying Living Expenses: Not on file  Food Insecurity:   . Worried About Charity fundraiser in the Last Year: Not on file  . Ran Out of Food in the Last Year: Not on file  Transportation Needs:   . Lack of Transportation (Medical): Not on file  . Lack of Transportation (Non-Medical): Not on file  Physical Activity:   . Days of Exercise per Week: Not on file  . Minutes of Exercise per Session: Not on file  Stress:   . Feeling of Stress : Not on file  Social Connections:   . Frequency of Communication with Friends and Family: Not on file  . Frequency of Social Gatherings with Friends and Family: Not on file  . Attends Religious Services: Not on file  . Active Member of Clubs or Organizations: Not on file  . Attends Archivist Meetings: Not on file  . Marital Status: Not on file  Intimate Partner Violence:   . Fear of Current or Ex-Partner: Not on file  . Emotionally Abused: Not on file  . Physically Abused: Not on file  . Sexually Abused: Not on file    Family History  Problem Relation Age of Onset  . Breast cancer Neg Hx      Current Outpatient Medications:  .  acetaminophen (TYLENOL) 500 MG tablet, Take 500 mg by mouth QID., Disp: , Rfl:  .  alendronate (FOSAMAX) 70 MG tablet, Take 70 mg by mouth once a week. , Disp: , Rfl:  .  allopurinol (ZYLOPRIM) 300 MG tablet, Take 300 mg by mouth daily., Disp: , Rfl: 11 .  atenolol (TENORMIN) 50 MG tablet, Take 50 mg by mouth daily. , Disp: , Rfl:  .  atorvastatin (LIPITOR) 40 MG tablet, Take 40 mg by mouth daily. , Disp: , Rfl:  .  FERREX 150 150 MG capsule, Take 150 mg by mouth daily., Disp: , Rfl: 5 .  furosemide (LASIX) 40 MG tablet, Take 40 mg by mouth daily., Disp: , Rfl:  .  levothyroxine (SYNTHROID, LEVOTHROID) 75 MCG tablet, Take 75 mcg by mouth daily. , Disp: , Rfl:  .  lisinopril-hydrochlorothiazide (PRINZIDE,ZESTORETIC) 20-12.5 MG tablet, Take 1 tablet by mouth daily. , Disp: , Rfl:  .  metFORMIN  (GLUCOPHAGE) 1000 MG tablet, Take 1,000 mg by mouth daily with breakfast. , Disp: , Rfl:  .  vitamin B-12 (CYANOCOBALAMIN) 1000 MCG tablet, Take 1,000 mcg by mouth daily., Disp: , Rfl:  .  Vitamin D, Ergocalciferol, (DRISDOL) 50000 UNITS CAPS capsule, Take 50,000 Units by mouth every 30 (thirty) days. , Disp: , Rfl:  .  Ascorbic Acid (VITAMIN C) 1000 MG tablet, Take 1,000 mg by mouth daily., Disp: , Rfl:  .  tamoxifen (NOLVADEX) 20 MG tablet, TAKE 1 TABLET BY MOUTH ONCE DAILY., Disp: 90 tablet, Rfl: 0  Physical exam:  Vitals:   12/27/19 2059  BP: (!) 90/54  Pulse: 77  Resp: 16  Temp: 98.7 F (37.1 C)  TempSrc: Tympanic  Weight: 134 lb (60.8 kg)   Physical Exam Constitutional:  Comments: Elderly woman sitting in a wheelchair. Appears in no acute distress  HENT:     Head: Normocephalic and atraumatic.  Eyes:     Pupils: Pupils are equal, round, and reactive to light.  Cardiovascular:     Rate and Rhythm: Normal rate and regular rhythm.     Heart sounds: Normal heart sounds.  Pulmonary:     Effort: Pulmonary effort is normal.     Breath sounds: Normal breath sounds.  Abdominal:     General: Bowel sounds are normal.     Palpations: Abdomen is soft.  Musculoskeletal:     Cervical back: Normal range of motion.     Right lower leg: Edema present.     Left lower leg: Edema present.  Skin:    General: Skin is warm and dry.  Neurological:     Mental Status: She is alert and oriented to person, place, and time.      CMP Latest Ref Rng & Units 12/22/2018  Glucose 70 - 99 mg/dL 154(H)  BUN 8 - 23 mg/dL 44(H)  Creatinine 0.44 - 1.00 mg/dL 1.14(H)  Sodium 135 - 145 mmol/L 138  Potassium 3.5 - 5.1 mmol/L 3.4(L)  Chloride 98 - 111 mmol/L 103  CO2 22 - 32 mmol/L 25  Calcium 8.9 - 10.3 mg/dL 8.6(L)  Total Protein 6.5 - 8.1 g/dL 7.1  Total Bilirubin 0.3 - 1.2 mg/dL 0.5  Alkaline Phos 38 - 126 U/L 45  AST 15 - 41 U/L 23  ALT 0 - 44 U/L 15   CBC Latest Ref Rng & Units  12/22/2018  WBC 4.0 - 10.5 K/uL 12.7(H)  Hemoglobin 12.0 - 15.0 g/dL 10.5(L)  Hematocrit 36.0 - 46.0 % 33.3(L)  Platelets 150 - 400 K/uL 212     Assessment and plan- Patient is a 79 y.o. female with stage I right breast cancer presently on tamoxifen here for routine f/u  Patient is having trouble sitting up on examination table today. Breast exam was not performed. She has a mammogram scheduled for next week  I have asked her to stop her tamoxifen at this time as she has completed 5 years of treatment and risk of dvt with her limited mobility.   I will see her in 6 months for breast exam   Visit Diagnosis 1. Encounter for follow-up surveillance of breast cancer   2. Encounter for monitoring tamoxifen therapy      Dr. Randa Evens, MD, MPH [facility] [phone] 12/27/2019 5:51 PM

## 2019-12-30 ENCOUNTER — Ambulatory Visit
Admission: RE | Admit: 2019-12-30 | Discharge: 2019-12-30 | Disposition: A | Discharge status: Home / Self Care | Payer: PPO | Source: Ambulatory Visit | Attending: Surgery | Admitting: Surgery

## 2019-12-30 ENCOUNTER — Other Ambulatory Visit: Payer: Self-pay

## 2019-12-30 DIAGNOSIS — Z1231 Encounter for screening mammogram for malignant neoplasm of breast: Secondary | ICD-10-CM | POA: Insufficient documentation

## 2020-01-08 ENCOUNTER — Ambulatory Visit (INDEPENDENT_AMBULATORY_CARE_PROVIDER_SITE_OTHER): Payer: PPO | Admitting: Nurse Practitioner

## 2020-01-20 DIAGNOSIS — M47812 Spondylosis without myelopathy or radiculopathy, cervical region: Secondary | ICD-10-CM | POA: Diagnosis not present

## 2020-02-08 DIAGNOSIS — E1151 Type 2 diabetes mellitus with diabetic peripheral angiopathy without gangrene: Secondary | ICD-10-CM | POA: Diagnosis not present

## 2020-02-08 DIAGNOSIS — I890 Lymphedema, not elsewhere classified: Secondary | ICD-10-CM | POA: Diagnosis not present

## 2020-02-08 DIAGNOSIS — L97919 Non-pressure chronic ulcer of unspecified part of right lower leg with unspecified severity: Secondary | ICD-10-CM | POA: Diagnosis not present

## 2020-02-08 DIAGNOSIS — B351 Tinea unguium: Secondary | ICD-10-CM | POA: Diagnosis not present

## 2020-02-08 DIAGNOSIS — M79675 Pain in left toe(s): Secondary | ICD-10-CM | POA: Diagnosis not present

## 2020-02-08 DIAGNOSIS — I83019 Varicose veins of right lower extremity with ulcer of unspecified site: Secondary | ICD-10-CM | POA: Diagnosis not present

## 2020-02-08 DIAGNOSIS — M79674 Pain in right toe(s): Secondary | ICD-10-CM | POA: Diagnosis not present

## 2020-02-09 DIAGNOSIS — M47812 Spondylosis without myelopathy or radiculopathy, cervical region: Secondary | ICD-10-CM | POA: Diagnosis not present

## 2020-02-09 DIAGNOSIS — Z79899 Other long term (current) drug therapy: Secondary | ICD-10-CM | POA: Diagnosis not present

## 2020-02-16 DIAGNOSIS — E1151 Type 2 diabetes mellitus with diabetic peripheral angiopathy without gangrene: Secondary | ICD-10-CM | POA: Diagnosis not present

## 2020-02-16 DIAGNOSIS — I83019 Varicose veins of right lower extremity with ulcer of unspecified site: Secondary | ICD-10-CM | POA: Diagnosis not present

## 2020-02-16 DIAGNOSIS — I890 Lymphedema, not elsewhere classified: Secondary | ICD-10-CM | POA: Diagnosis not present

## 2020-02-16 DIAGNOSIS — L97919 Non-pressure chronic ulcer of unspecified part of right lower leg with unspecified severity: Secondary | ICD-10-CM | POA: Diagnosis not present

## 2020-02-22 DIAGNOSIS — I83019 Varicose veins of right lower extremity with ulcer of unspecified site: Secondary | ICD-10-CM | POA: Diagnosis not present

## 2020-02-22 DIAGNOSIS — E1151 Type 2 diabetes mellitus with diabetic peripheral angiopathy without gangrene: Secondary | ICD-10-CM | POA: Diagnosis not present

## 2020-02-22 DIAGNOSIS — L97919 Non-pressure chronic ulcer of unspecified part of right lower leg with unspecified severity: Secondary | ICD-10-CM | POA: Diagnosis not present

## 2020-02-22 DIAGNOSIS — I890 Lymphedema, not elsewhere classified: Secondary | ICD-10-CM | POA: Diagnosis not present

## 2020-02-29 DIAGNOSIS — Z853 Personal history of malignant neoplasm of breast: Secondary | ICD-10-CM | POA: Diagnosis not present

## 2020-02-29 DIAGNOSIS — M810 Age-related osteoporosis without current pathological fracture: Secondary | ICD-10-CM | POA: Diagnosis not present

## 2020-02-29 DIAGNOSIS — M47812 Spondylosis without myelopathy or radiculopathy, cervical region: Secondary | ICD-10-CM | POA: Diagnosis not present

## 2020-02-29 DIAGNOSIS — E785 Hyperlipidemia, unspecified: Secondary | ICD-10-CM | POA: Diagnosis not present

## 2020-02-29 DIAGNOSIS — M5030 Other cervical disc degeneration, unspecified cervical region: Secondary | ICD-10-CM | POA: Diagnosis not present

## 2020-02-29 DIAGNOSIS — M8580 Other specified disorders of bone density and structure, unspecified site: Secondary | ICD-10-CM | POA: Diagnosis not present

## 2020-02-29 DIAGNOSIS — Z7984 Long term (current) use of oral hypoglycemic drugs: Secondary | ICD-10-CM | POA: Diagnosis not present

## 2020-02-29 DIAGNOSIS — E119 Type 2 diabetes mellitus without complications: Secondary | ICD-10-CM | POA: Diagnosis not present

## 2020-02-29 DIAGNOSIS — I10 Essential (primary) hypertension: Secondary | ICD-10-CM | POA: Diagnosis not present

## 2020-02-29 DIAGNOSIS — I890 Lymphedema, not elsewhere classified: Secondary | ICD-10-CM | POA: Diagnosis not present

## 2020-02-29 DIAGNOSIS — M48061 Spinal stenosis, lumbar region without neurogenic claudication: Secondary | ICD-10-CM | POA: Diagnosis not present

## 2020-03-09 DIAGNOSIS — M1A00X0 Idiopathic chronic gout, unspecified site, without tophus (tophi): Secondary | ICD-10-CM | POA: Diagnosis not present

## 2020-03-09 DIAGNOSIS — M47812 Spondylosis without myelopathy or radiculopathy, cervical region: Secondary | ICD-10-CM | POA: Insufficient documentation

## 2020-03-09 DIAGNOSIS — E790 Hyperuricemia without signs of inflammatory arthritis and tophaceous disease: Secondary | ICD-10-CM | POA: Diagnosis not present

## 2020-03-10 DIAGNOSIS — M47812 Spondylosis without myelopathy or radiculopathy, cervical region: Secondary | ICD-10-CM | POA: Diagnosis not present

## 2020-03-15 DIAGNOSIS — M48061 Spinal stenosis, lumbar region without neurogenic claudication: Secondary | ICD-10-CM | POA: Diagnosis not present

## 2020-03-15 DIAGNOSIS — M5030 Other cervical disc degeneration, unspecified cervical region: Secondary | ICD-10-CM | POA: Diagnosis not present

## 2020-03-15 DIAGNOSIS — I890 Lymphedema, not elsewhere classified: Secondary | ICD-10-CM | POA: Diagnosis not present

## 2020-03-15 DIAGNOSIS — M8580 Other specified disorders of bone density and structure, unspecified site: Secondary | ICD-10-CM | POA: Diagnosis not present

## 2020-03-15 DIAGNOSIS — E785 Hyperlipidemia, unspecified: Secondary | ICD-10-CM | POA: Diagnosis not present

## 2020-03-15 DIAGNOSIS — M47812 Spondylosis without myelopathy or radiculopathy, cervical region: Secondary | ICD-10-CM | POA: Diagnosis not present

## 2020-03-15 DIAGNOSIS — I10 Essential (primary) hypertension: Secondary | ICD-10-CM | POA: Diagnosis not present

## 2020-03-15 DIAGNOSIS — E119 Type 2 diabetes mellitus without complications: Secondary | ICD-10-CM | POA: Diagnosis not present

## 2020-03-15 DIAGNOSIS — M810 Age-related osteoporosis without current pathological fracture: Secondary | ICD-10-CM | POA: Diagnosis not present

## 2020-03-15 DIAGNOSIS — Z7984 Long term (current) use of oral hypoglycemic drugs: Secondary | ICD-10-CM | POA: Diagnosis not present

## 2020-03-15 DIAGNOSIS — Z853 Personal history of malignant neoplasm of breast: Secondary | ICD-10-CM | POA: Diagnosis not present

## 2020-03-29 DIAGNOSIS — L97919 Non-pressure chronic ulcer of unspecified part of right lower leg with unspecified severity: Secondary | ICD-10-CM | POA: Diagnosis not present

## 2020-03-29 DIAGNOSIS — I890 Lymphedema, not elsewhere classified: Secondary | ICD-10-CM | POA: Diagnosis not present

## 2020-03-29 DIAGNOSIS — L03115 Cellulitis of right lower limb: Secondary | ICD-10-CM | POA: Diagnosis not present

## 2020-03-29 DIAGNOSIS — I83019 Varicose veins of right lower extremity with ulcer of unspecified site: Secondary | ICD-10-CM | POA: Diagnosis not present

## 2020-03-29 DIAGNOSIS — E1151 Type 2 diabetes mellitus with diabetic peripheral angiopathy without gangrene: Secondary | ICD-10-CM | POA: Diagnosis not present

## 2020-03-29 DIAGNOSIS — L03116 Cellulitis of left lower limb: Secondary | ICD-10-CM | POA: Diagnosis not present

## 2020-03-30 DIAGNOSIS — I10 Essential (primary) hypertension: Secondary | ICD-10-CM | POA: Diagnosis not present

## 2020-03-30 DIAGNOSIS — M48061 Spinal stenosis, lumbar region without neurogenic claudication: Secondary | ICD-10-CM | POA: Diagnosis not present

## 2020-03-30 DIAGNOSIS — Z7984 Long term (current) use of oral hypoglycemic drugs: Secondary | ICD-10-CM | POA: Diagnosis not present

## 2020-03-30 DIAGNOSIS — M810 Age-related osteoporosis without current pathological fracture: Secondary | ICD-10-CM | POA: Diagnosis not present

## 2020-03-30 DIAGNOSIS — M47812 Spondylosis without myelopathy or radiculopathy, cervical region: Secondary | ICD-10-CM | POA: Diagnosis not present

## 2020-03-30 DIAGNOSIS — E785 Hyperlipidemia, unspecified: Secondary | ICD-10-CM | POA: Diagnosis not present

## 2020-03-30 DIAGNOSIS — I890 Lymphedema, not elsewhere classified: Secondary | ICD-10-CM | POA: Diagnosis not present

## 2020-03-30 DIAGNOSIS — M5030 Other cervical disc degeneration, unspecified cervical region: Secondary | ICD-10-CM | POA: Diagnosis not present

## 2020-03-30 DIAGNOSIS — E119 Type 2 diabetes mellitus without complications: Secondary | ICD-10-CM | POA: Diagnosis not present

## 2020-03-30 DIAGNOSIS — M8580 Other specified disorders of bone density and structure, unspecified site: Secondary | ICD-10-CM | POA: Diagnosis not present

## 2020-03-30 DIAGNOSIS — Z853 Personal history of malignant neoplasm of breast: Secondary | ICD-10-CM | POA: Diagnosis not present

## 2020-04-01 DIAGNOSIS — M2041 Other hammer toe(s) (acquired), right foot: Secondary | ICD-10-CM | POA: Diagnosis not present

## 2020-04-01 DIAGNOSIS — L02619 Cutaneous abscess of unspecified foot: Secondary | ICD-10-CM | POA: Diagnosis not present

## 2020-04-01 DIAGNOSIS — I83022 Varicose veins of left lower extremity with ulcer of calf: Secondary | ICD-10-CM | POA: Diagnosis not present

## 2020-04-01 DIAGNOSIS — I83012 Varicose veins of right lower extremity with ulcer of calf: Secondary | ICD-10-CM | POA: Diagnosis not present

## 2020-04-01 DIAGNOSIS — L97212 Non-pressure chronic ulcer of right calf with fat layer exposed: Secondary | ICD-10-CM | POA: Diagnosis not present

## 2020-04-01 DIAGNOSIS — L03119 Cellulitis of unspecified part of limb: Secondary | ICD-10-CM | POA: Diagnosis not present

## 2020-04-01 DIAGNOSIS — I890 Lymphedema, not elsewhere classified: Secondary | ICD-10-CM | POA: Diagnosis not present

## 2020-04-01 DIAGNOSIS — L97222 Non-pressure chronic ulcer of left calf with fat layer exposed: Secondary | ICD-10-CM | POA: Diagnosis not present

## 2020-04-01 DIAGNOSIS — I83893 Varicose veins of bilateral lower extremities with other complications: Secondary | ICD-10-CM | POA: Diagnosis not present

## 2020-04-01 DIAGNOSIS — M2011 Hallux valgus (acquired), right foot: Secondary | ICD-10-CM | POA: Diagnosis not present

## 2020-04-01 DIAGNOSIS — S90821A Blister (nonthermal), right foot, initial encounter: Secondary | ICD-10-CM | POA: Diagnosis not present

## 2020-04-01 DIAGNOSIS — M79672 Pain in left foot: Secondary | ICD-10-CM | POA: Diagnosis not present

## 2020-04-01 DIAGNOSIS — E1151 Type 2 diabetes mellitus with diabetic peripheral angiopathy without gangrene: Secondary | ICD-10-CM | POA: Diagnosis not present

## 2020-04-01 DIAGNOSIS — M79671 Pain in right foot: Secondary | ICD-10-CM | POA: Diagnosis not present

## 2020-04-04 ENCOUNTER — Other Ambulatory Visit: Payer: Self-pay | Admitting: Family Medicine

## 2020-04-04 DIAGNOSIS — M5136 Other intervertebral disc degeneration, lumbar region: Secondary | ICD-10-CM | POA: Diagnosis not present

## 2020-04-04 DIAGNOSIS — M5416 Radiculopathy, lumbar region: Secondary | ICD-10-CM | POA: Diagnosis not present

## 2020-04-04 DIAGNOSIS — M5030 Other cervical disc degeneration, unspecified cervical region: Secondary | ICD-10-CM

## 2020-04-04 DIAGNOSIS — M25562 Pain in left knee: Secondary | ICD-10-CM | POA: Diagnosis not present

## 2020-04-04 DIAGNOSIS — G8929 Other chronic pain: Secondary | ICD-10-CM | POA: Diagnosis not present

## 2020-04-04 DIAGNOSIS — M47812 Spondylosis without myelopathy or radiculopathy, cervical region: Secondary | ICD-10-CM

## 2020-04-06 DIAGNOSIS — I890 Lymphedema, not elsewhere classified: Secondary | ICD-10-CM | POA: Diagnosis not present

## 2020-04-06 DIAGNOSIS — M2012 Hallux valgus (acquired), left foot: Secondary | ICD-10-CM | POA: Diagnosis not present

## 2020-04-06 DIAGNOSIS — M2011 Hallux valgus (acquired), right foot: Secondary | ICD-10-CM | POA: Diagnosis not present

## 2020-04-06 DIAGNOSIS — L97221 Non-pressure chronic ulcer of left calf limited to breakdown of skin: Secondary | ICD-10-CM | POA: Diagnosis not present

## 2020-04-06 DIAGNOSIS — I83022 Varicose veins of left lower extremity with ulcer of calf: Secondary | ICD-10-CM | POA: Diagnosis not present

## 2020-04-12 DIAGNOSIS — M2011 Hallux valgus (acquired), right foot: Secondary | ICD-10-CM | POA: Diagnosis not present

## 2020-04-12 DIAGNOSIS — M48061 Spinal stenosis, lumbar region without neurogenic claudication: Secondary | ICD-10-CM | POA: Diagnosis not present

## 2020-04-12 DIAGNOSIS — E785 Hyperlipidemia, unspecified: Secondary | ICD-10-CM | POA: Diagnosis not present

## 2020-04-12 DIAGNOSIS — M810 Age-related osteoporosis without current pathological fracture: Secondary | ICD-10-CM | POA: Diagnosis not present

## 2020-04-12 DIAGNOSIS — I83022 Varicose veins of left lower extremity with ulcer of calf: Secondary | ICD-10-CM | POA: Diagnosis not present

## 2020-04-12 DIAGNOSIS — M8580 Other specified disorders of bone density and structure, unspecified site: Secondary | ICD-10-CM | POA: Diagnosis not present

## 2020-04-12 DIAGNOSIS — L97221 Non-pressure chronic ulcer of left calf limited to breakdown of skin: Secondary | ICD-10-CM | POA: Diagnosis not present

## 2020-04-12 DIAGNOSIS — L97512 Non-pressure chronic ulcer of other part of right foot with fat layer exposed: Secondary | ICD-10-CM | POA: Diagnosis not present

## 2020-04-12 DIAGNOSIS — M47812 Spondylosis without myelopathy or radiculopathy, cervical region: Secondary | ICD-10-CM | POA: Diagnosis not present

## 2020-04-12 DIAGNOSIS — Z7984 Long term (current) use of oral hypoglycemic drugs: Secondary | ICD-10-CM | POA: Diagnosis not present

## 2020-04-12 DIAGNOSIS — M2012 Hallux valgus (acquired), left foot: Secondary | ICD-10-CM | POA: Diagnosis not present

## 2020-04-12 DIAGNOSIS — Z853 Personal history of malignant neoplasm of breast: Secondary | ICD-10-CM | POA: Diagnosis not present

## 2020-04-12 DIAGNOSIS — M5030 Other cervical disc degeneration, unspecified cervical region: Secondary | ICD-10-CM | POA: Diagnosis not present

## 2020-04-12 DIAGNOSIS — I10 Essential (primary) hypertension: Secondary | ICD-10-CM | POA: Diagnosis not present

## 2020-04-12 DIAGNOSIS — E119 Type 2 diabetes mellitus without complications: Secondary | ICD-10-CM | POA: Diagnosis not present

## 2020-04-12 DIAGNOSIS — L97519 Non-pressure chronic ulcer of other part of right foot with unspecified severity: Secondary | ICD-10-CM | POA: Diagnosis not present

## 2020-04-12 DIAGNOSIS — I890 Lymphedema, not elsewhere classified: Secondary | ICD-10-CM | POA: Diagnosis not present

## 2020-04-17 ENCOUNTER — Ambulatory Visit
Admission: RE | Admit: 2020-04-17 | Discharge: 2020-04-17 | Disposition: A | Discharge status: Home / Self Care | Payer: PPO | Source: Ambulatory Visit | Attending: Family Medicine | Admitting: Family Medicine

## 2020-04-17 ENCOUNTER — Other Ambulatory Visit: Payer: Self-pay

## 2020-04-17 DIAGNOSIS — M5030 Other cervical disc degeneration, unspecified cervical region: Secondary | ICD-10-CM

## 2020-04-17 DIAGNOSIS — M47812 Spondylosis without myelopathy or radiculopathy, cervical region: Secondary | ICD-10-CM | POA: Insufficient documentation

## 2020-04-17 DIAGNOSIS — M542 Cervicalgia: Secondary | ICD-10-CM | POA: Diagnosis not present

## 2020-04-19 DIAGNOSIS — M47812 Spondylosis without myelopathy or radiculopathy, cervical region: Secondary | ICD-10-CM | POA: Diagnosis not present

## 2020-04-19 DIAGNOSIS — M5030 Other cervical disc degeneration, unspecified cervical region: Secondary | ICD-10-CM | POA: Diagnosis not present

## 2020-04-19 DIAGNOSIS — M5020 Other cervical disc displacement, unspecified cervical region: Secondary | ICD-10-CM | POA: Diagnosis not present

## 2020-04-19 DIAGNOSIS — M4802 Spinal stenosis, cervical region: Secondary | ICD-10-CM | POA: Diagnosis not present

## 2020-04-20 DIAGNOSIS — I83013 Varicose veins of right lower extremity with ulcer of ankle: Secondary | ICD-10-CM | POA: Diagnosis not present

## 2020-04-20 DIAGNOSIS — I83022 Varicose veins of left lower extremity with ulcer of calf: Secondary | ICD-10-CM | POA: Diagnosis not present

## 2020-04-20 DIAGNOSIS — I890 Lymphedema, not elsewhere classified: Secondary | ICD-10-CM | POA: Diagnosis not present

## 2020-04-20 DIAGNOSIS — L97512 Non-pressure chronic ulcer of other part of right foot with fat layer exposed: Secondary | ICD-10-CM | POA: Diagnosis not present

## 2020-04-20 DIAGNOSIS — L97221 Non-pressure chronic ulcer of left calf limited to breakdown of skin: Secondary | ICD-10-CM | POA: Diagnosis not present

## 2020-04-20 DIAGNOSIS — E119 Type 2 diabetes mellitus without complications: Secondary | ICD-10-CM | POA: Diagnosis not present

## 2020-04-20 DIAGNOSIS — M2012 Hallux valgus (acquired), left foot: Secondary | ICD-10-CM | POA: Diagnosis not present

## 2020-04-20 DIAGNOSIS — L97319 Non-pressure chronic ulcer of right ankle with unspecified severity: Secondary | ICD-10-CM | POA: Diagnosis not present

## 2020-04-20 DIAGNOSIS — M2011 Hallux valgus (acquired), right foot: Secondary | ICD-10-CM | POA: Diagnosis not present

## 2020-04-25 DIAGNOSIS — L97319 Non-pressure chronic ulcer of right ankle with unspecified severity: Secondary | ICD-10-CM | POA: Diagnosis not present

## 2020-04-25 DIAGNOSIS — I83022 Varicose veins of left lower extremity with ulcer of calf: Secondary | ICD-10-CM | POA: Diagnosis not present

## 2020-04-25 DIAGNOSIS — I890 Lymphedema, not elsewhere classified: Secondary | ICD-10-CM | POA: Diagnosis not present

## 2020-04-25 DIAGNOSIS — L97512 Non-pressure chronic ulcer of other part of right foot with fat layer exposed: Secondary | ICD-10-CM | POA: Diagnosis not present

## 2020-04-25 DIAGNOSIS — L97221 Non-pressure chronic ulcer of left calf limited to breakdown of skin: Secondary | ICD-10-CM | POA: Diagnosis not present

## 2020-04-25 DIAGNOSIS — M2012 Hallux valgus (acquired), left foot: Secondary | ICD-10-CM | POA: Diagnosis not present

## 2020-04-25 DIAGNOSIS — E119 Type 2 diabetes mellitus without complications: Secondary | ICD-10-CM | POA: Diagnosis not present

## 2020-04-25 DIAGNOSIS — I83013 Varicose veins of right lower extremity with ulcer of ankle: Secondary | ICD-10-CM | POA: Diagnosis not present

## 2020-04-25 DIAGNOSIS — M2011 Hallux valgus (acquired), right foot: Secondary | ICD-10-CM | POA: Diagnosis not present

## 2020-04-28 DIAGNOSIS — M4802 Spinal stenosis, cervical region: Secondary | ICD-10-CM | POA: Diagnosis not present

## 2020-04-28 DIAGNOSIS — M419 Scoliosis, unspecified: Secondary | ICD-10-CM | POA: Diagnosis not present

## 2020-04-28 DIAGNOSIS — M542 Cervicalgia: Secondary | ICD-10-CM | POA: Diagnosis not present

## 2020-04-28 DIAGNOSIS — R29898 Other symptoms and signs involving the musculoskeletal system: Secondary | ICD-10-CM | POA: Diagnosis not present

## 2020-04-29 ENCOUNTER — Other Ambulatory Visit: Payer: Self-pay | Admitting: Neurosurgery

## 2020-04-29 DIAGNOSIS — M419 Scoliosis, unspecified: Secondary | ICD-10-CM

## 2020-05-02 DIAGNOSIS — M2011 Hallux valgus (acquired), right foot: Secondary | ICD-10-CM | POA: Diagnosis not present

## 2020-05-02 DIAGNOSIS — L97221 Non-pressure chronic ulcer of left calf limited to breakdown of skin: Secondary | ICD-10-CM | POA: Diagnosis not present

## 2020-05-02 DIAGNOSIS — E119 Type 2 diabetes mellitus without complications: Secondary | ICD-10-CM | POA: Diagnosis not present

## 2020-05-02 DIAGNOSIS — L97319 Non-pressure chronic ulcer of right ankle with unspecified severity: Secondary | ICD-10-CM | POA: Diagnosis not present

## 2020-05-02 DIAGNOSIS — L97512 Non-pressure chronic ulcer of other part of right foot with fat layer exposed: Secondary | ICD-10-CM | POA: Diagnosis not present

## 2020-05-02 DIAGNOSIS — I83013 Varicose veins of right lower extremity with ulcer of ankle: Secondary | ICD-10-CM | POA: Diagnosis not present

## 2020-05-02 DIAGNOSIS — I890 Lymphedema, not elsewhere classified: Secondary | ICD-10-CM | POA: Diagnosis not present

## 2020-05-02 DIAGNOSIS — M2012 Hallux valgus (acquired), left foot: Secondary | ICD-10-CM | POA: Diagnosis not present

## 2020-05-02 DIAGNOSIS — I83022 Varicose veins of left lower extremity with ulcer of calf: Secondary | ICD-10-CM | POA: Diagnosis not present

## 2020-05-06 DIAGNOSIS — M530 Cervicocranial syndrome: Secondary | ICD-10-CM | POA: Diagnosis not present

## 2020-05-09 DIAGNOSIS — I890 Lymphedema, not elsewhere classified: Secondary | ICD-10-CM | POA: Diagnosis not present

## 2020-05-09 DIAGNOSIS — M2012 Hallux valgus (acquired), left foot: Secondary | ICD-10-CM | POA: Diagnosis not present

## 2020-05-09 DIAGNOSIS — E119 Type 2 diabetes mellitus without complications: Secondary | ICD-10-CM | POA: Diagnosis not present

## 2020-05-09 DIAGNOSIS — L97319 Non-pressure chronic ulcer of right ankle with unspecified severity: Secondary | ICD-10-CM | POA: Diagnosis not present

## 2020-05-09 DIAGNOSIS — L97221 Non-pressure chronic ulcer of left calf limited to breakdown of skin: Secondary | ICD-10-CM | POA: Diagnosis not present

## 2020-05-09 DIAGNOSIS — L97512 Non-pressure chronic ulcer of other part of right foot with fat layer exposed: Secondary | ICD-10-CM | POA: Diagnosis not present

## 2020-05-09 DIAGNOSIS — I83022 Varicose veins of left lower extremity with ulcer of calf: Secondary | ICD-10-CM | POA: Diagnosis not present

## 2020-05-09 DIAGNOSIS — M2011 Hallux valgus (acquired), right foot: Secondary | ICD-10-CM | POA: Diagnosis not present

## 2020-05-09 DIAGNOSIS — I83013 Varicose veins of right lower extremity with ulcer of ankle: Secondary | ICD-10-CM | POA: Diagnosis not present

## 2020-05-14 ENCOUNTER — Ambulatory Visit
Admission: RE | Admit: 2020-05-14 | Discharge: 2020-05-14 | Disposition: A | Discharge status: Home / Self Care | Payer: PPO | Attending: Neurosurgery | Admitting: Neurosurgery

## 2020-05-14 ENCOUNTER — Ambulatory Visit
Admission: RE | Admit: 2020-05-14 | Discharge: 2020-05-14 | Disposition: A | Discharge status: Home / Self Care | Payer: PPO | Source: Ambulatory Visit | Attending: Neurosurgery | Admitting: Neurosurgery

## 2020-05-14 DIAGNOSIS — M419 Scoliosis, unspecified: Secondary | ICD-10-CM

## 2020-05-14 DIAGNOSIS — M4184 Other forms of scoliosis, thoracic region: Secondary | ICD-10-CM | POA: Diagnosis not present

## 2020-05-14 DIAGNOSIS — M4185 Other forms of scoliosis, thoracolumbar region: Secondary | ICD-10-CM | POA: Diagnosis not present

## 2020-05-14 DIAGNOSIS — M47816 Spondylosis without myelopathy or radiculopathy, lumbar region: Secondary | ICD-10-CM | POA: Diagnosis not present

## 2020-05-14 DIAGNOSIS — M47814 Spondylosis without myelopathy or radiculopathy, thoracic region: Secondary | ICD-10-CM | POA: Diagnosis not present

## 2020-05-17 DIAGNOSIS — I83022 Varicose veins of left lower extremity with ulcer of calf: Secondary | ICD-10-CM | POA: Diagnosis not present

## 2020-05-17 DIAGNOSIS — L97512 Non-pressure chronic ulcer of other part of right foot with fat layer exposed: Secondary | ICD-10-CM | POA: Diagnosis not present

## 2020-05-17 DIAGNOSIS — B351 Tinea unguium: Secondary | ICD-10-CM | POA: Diagnosis not present

## 2020-05-17 DIAGNOSIS — E119 Type 2 diabetes mellitus without complications: Secondary | ICD-10-CM | POA: Diagnosis not present

## 2020-05-17 DIAGNOSIS — M79675 Pain in left toe(s): Secondary | ICD-10-CM | POA: Diagnosis not present

## 2020-05-17 DIAGNOSIS — I890 Lymphedema, not elsewhere classified: Secondary | ICD-10-CM | POA: Diagnosis not present

## 2020-05-17 DIAGNOSIS — L97319 Non-pressure chronic ulcer of right ankle with unspecified severity: Secondary | ICD-10-CM | POA: Diagnosis not present

## 2020-05-17 DIAGNOSIS — M79674 Pain in right toe(s): Secondary | ICD-10-CM | POA: Diagnosis not present

## 2020-05-17 DIAGNOSIS — M2012 Hallux valgus (acquired), left foot: Secondary | ICD-10-CM | POA: Diagnosis not present

## 2020-05-17 DIAGNOSIS — L97221 Non-pressure chronic ulcer of left calf limited to breakdown of skin: Secondary | ICD-10-CM | POA: Diagnosis not present

## 2020-05-17 DIAGNOSIS — I83013 Varicose veins of right lower extremity with ulcer of ankle: Secondary | ICD-10-CM | POA: Diagnosis not present

## 2020-05-17 DIAGNOSIS — M2011 Hallux valgus (acquired), right foot: Secondary | ICD-10-CM | POA: Diagnosis not present

## 2020-05-20 DIAGNOSIS — M47812 Spondylosis without myelopathy or radiculopathy, cervical region: Secondary | ICD-10-CM | POA: Diagnosis not present

## 2020-05-20 DIAGNOSIS — M5030 Other cervical disc degeneration, unspecified cervical region: Secondary | ICD-10-CM | POA: Diagnosis not present

## 2020-05-20 DIAGNOSIS — M4802 Spinal stenosis, cervical region: Secondary | ICD-10-CM | POA: Diagnosis not present

## 2020-05-20 DIAGNOSIS — M5020 Other cervical disc displacement, unspecified cervical region: Secondary | ICD-10-CM | POA: Diagnosis not present

## 2020-06-02 DIAGNOSIS — L97221 Non-pressure chronic ulcer of left calf limited to breakdown of skin: Secondary | ICD-10-CM | POA: Diagnosis not present

## 2020-06-02 DIAGNOSIS — E119 Type 2 diabetes mellitus without complications: Secondary | ICD-10-CM | POA: Diagnosis not present

## 2020-06-02 DIAGNOSIS — L97512 Non-pressure chronic ulcer of other part of right foot with fat layer exposed: Secondary | ICD-10-CM | POA: Diagnosis not present

## 2020-06-02 DIAGNOSIS — I890 Lymphedema, not elsewhere classified: Secondary | ICD-10-CM | POA: Diagnosis not present

## 2020-06-02 DIAGNOSIS — I83022 Varicose veins of left lower extremity with ulcer of calf: Secondary | ICD-10-CM | POA: Diagnosis not present

## 2020-06-08 DIAGNOSIS — M1712 Unilateral primary osteoarthritis, left knee: Secondary | ICD-10-CM | POA: Diagnosis not present

## 2020-06-08 DIAGNOSIS — E119 Type 2 diabetes mellitus without complications: Secondary | ICD-10-CM | POA: Diagnosis not present

## 2020-06-09 DIAGNOSIS — I890 Lymphedema, not elsewhere classified: Secondary | ICD-10-CM | POA: Diagnosis not present

## 2020-06-09 DIAGNOSIS — L97512 Non-pressure chronic ulcer of other part of right foot with fat layer exposed: Secondary | ICD-10-CM | POA: Diagnosis not present

## 2020-06-09 DIAGNOSIS — I83022 Varicose veins of left lower extremity with ulcer of calf: Secondary | ICD-10-CM | POA: Diagnosis not present

## 2020-06-09 DIAGNOSIS — L97221 Non-pressure chronic ulcer of left calf limited to breakdown of skin: Secondary | ICD-10-CM | POA: Diagnosis not present

## 2020-06-09 DIAGNOSIS — E119 Type 2 diabetes mellitus without complications: Secondary | ICD-10-CM | POA: Diagnosis not present

## 2020-06-16 DIAGNOSIS — I890 Lymphedema, not elsewhere classified: Secondary | ICD-10-CM | POA: Diagnosis not present

## 2020-06-16 DIAGNOSIS — L97221 Non-pressure chronic ulcer of left calf limited to breakdown of skin: Secondary | ICD-10-CM | POA: Diagnosis not present

## 2020-06-16 DIAGNOSIS — I83022 Varicose veins of left lower extremity with ulcer of calf: Secondary | ICD-10-CM | POA: Diagnosis not present

## 2020-06-16 DIAGNOSIS — E119 Type 2 diabetes mellitus without complications: Secondary | ICD-10-CM | POA: Diagnosis not present

## 2020-06-20 ENCOUNTER — Other Ambulatory Visit: Payer: Self-pay

## 2020-06-20 ENCOUNTER — Encounter (INDEPENDENT_AMBULATORY_CARE_PROVIDER_SITE_OTHER): Payer: Self-pay | Admitting: Nurse Practitioner

## 2020-06-20 ENCOUNTER — Ambulatory Visit (INDEPENDENT_AMBULATORY_CARE_PROVIDER_SITE_OTHER): Payer: PPO | Admitting: Nurse Practitioner

## 2020-06-20 VITALS — BP 108/63 | HR 74 | Resp 16

## 2020-06-20 DIAGNOSIS — I10 Essential (primary) hypertension: Secondary | ICD-10-CM

## 2020-06-20 DIAGNOSIS — L97311 Non-pressure chronic ulcer of right ankle limited to breakdown of skin: Secondary | ICD-10-CM | POA: Diagnosis not present

## 2020-06-23 ENCOUNTER — Encounter (INDEPENDENT_AMBULATORY_CARE_PROVIDER_SITE_OTHER): Payer: Self-pay | Admitting: Nurse Practitioner

## 2020-06-23 NOTE — Progress Notes (Signed)
Subjective:    Patient ID: [name], female    DOB: 04-28-1941, 79 y.o.   MRN: [id number] Chief Complaint  Patient presents with  . Follow-up    84yr follow up    The patient presents today initially due to a wound on her right great toe.  However, today the patient notes that the wound is essentially healed.  However the patient does continue to have recurrent edema in her bilateral lower extremities with a history of stasis ulcerations.  There is a new 1 beginning to form her left lower extremity.  These have been recurrent despite compression.  The patient has been treated for lymphedema and previous noninvasive studies show no evidence of venous reflux bilaterally.  Previous arterial studies showed normal ABIs with triphasic waveforms bilaterally.  The patient denies any fever, chills, nausea, vomiting or diarrhea.  Recently she has had bilateral Unna wraps applied by podiatry that have been helpful for controlling the swelling and stopping progression of venous ulceration.   Review of Systems  Cardiovascular: Positive for leg swelling.  Skin: Positive for wound.  Neurological: Positive for weakness.  All other systems reviewed and are negative.      Objective:   Physical Exam Vitals reviewed.  HENT:     Head: Normocephalic.  Cardiovascular:     Rate and Rhythm: Normal rate and regular rhythm.     Pulses: Normal pulses.  Pulmonary:     Effort: Pulmonary effort is normal.  Musculoskeletal:     Right lower leg: Edema present.     Left lower leg: Edema present.  Skin:    General: Skin is warm and dry.  Neurological:     Mental Status: She is alert and oriented to person, place, and time.  Psychiatric:        Mood and Affect: Mood normal.        Behavior: Behavior normal.        Thought Content: Thought content normal.        Judgment: Judgment normal.     BP 108/63 (BP Location: Left Arm)   Pulse 74   Resp 16   Past Medical History:  Diagnosis Date  .  Arthritis   . Arthritis   . Breast cancer ([place]) 1992   right breast with lumpectomy and rad tx  . Cancer of right breast ([place]) 04/16/2013   right breast with mastectomy  . Diabetes mellitus without complication ([place])   . Gout   . High cholesterol   . Hyperlipidemia   . Hypertension   . Personal history of radiation therapy     Social History   Socioeconomic History  . Marital status: Married    Spouse name: Not on file  . Number of children: 2  . Years of education: Not on file  . Highest education level: Not on file  Occupational History  . Not on file  Tobacco Use  . Smoking status: Never Smoker  . Smokeless tobacco: Never Used  Vaping Use  . Vaping Use: Never used  Substance and Sexual Activity  . Alcohol use: No    Alcohol/week: 0.0 standard drinks  . Drug use: No  . Sexual activity: Not Currently  Other Topics Concern  . Not on file  Social History Narrative  . Not on file   Social Determinants of Health   Financial Resource Strain:   . Difficulty of Paying Living Expenses:   Food Insecurity:   . Worried About Charity fundraiser  in the Last Year:   . [place] in the Last Year:   Transportation Needs:   . Film/video editor (Medical):   Marland Kitchen Lack of Transportation (Non-Medical):   Physical Activity:   . Days of Exercise per Week:   . Minutes of Exercise per Session:   Stress:   . Feeling of Stress :   Social Connections:   . Frequency of Communication with Friends and Family:   . Frequency of Social Gatherings with Friends and Family:   . Attends Religious Services:   . Active Member of Clubs or Organizations:   . Attends Archivist Meetings:   Marland Kitchen Marital Status:   Intimate Partner Violence:   . Fear of Current or Ex-Partner:   . Emotionally Abused:   Marland Kitchen Physically Abused:   . Sexually Abused:     Past Surgical History:  Procedure Laterality Date  . ABDOMINAL HYSTERECTOMY    . ANKLE FRACTURE SURGERY Right 2004  . BREAST  LUMPECTOMY Right 1992   positive  . MASTECTOMY Right 2014  . SHOULDER SURGERY Right 2010    Family History  Problem Relation Age of Onset  . Breast cancer Neg Hx     Allergies  Allergen Reactions  . Other Anaphylaxis    Anesthisia has been a health issue for her in the past.   . Sulfa Antibiotics Rash       Assessment & Plan:   1. Lower limb ulcer, ankle, right, limited to breakdown of skin ([place]) The patient's ulceration and blisters have mostly healed, however per the patient she continues to have blisters and ulcers as soon as her dressings are removed.  Because of this we will have the patient return with noninvasive studies to see if there is an area of reflux that may be amenable to intervention.  Otherwise patient will continue with wraps done by Dr.  2. Benign essential hypertension Continue antihypertensive medications as already ordered, these medications have been reviewed and there are no changes at this time.     Current Outpatient Medications on File Prior to Visit  Medication Sig Dispense Refill  . acetaminophen (TYLENOL) 500 MG tablet Take 500 mg by mouth QID.    Marland Kitchen alendronate (FOSAMAX) 70 MG tablet Take 70 mg by mouth once a week.     Marland Kitchen allopurinol (ZYLOPRIM) 300 MG tablet Take 300 mg by mouth daily.  11  . atenolol (TENORMIN) 50 MG tablet Take 50 mg by mouth daily.     Marland Kitchen atorvastatin (LIPITOR) 40 MG tablet Take 40 mg by mouth daily.     Marland Kitchen FERREX 150 150 MG capsule Take 150 mg by mouth daily.  5  . furosemide (LASIX) 40 MG tablet Take 40 mg by mouth daily.    Marland Kitchen HYDROcodone-acetaminophen (NORCO) 7.5-325 MG tablet Take 1 tablet by mouth 2 (two) times daily as needed.    Marland Kitchen levothyroxine (SYNTHROID, LEVOTHROID) 75 MCG tablet Take 75 mcg by mouth daily.     Marland Kitchen lisinopril-hydrochlorothiazide (PRINZIDE,ZESTORETIC) 20-12.5 MG tablet Take 1 tablet by mouth daily.     . meloxicam (MOBIC) 7.5 MG tablet Take 7.5 mg by mouth daily.    . metFORMIN (GLUCOPHAGE) 1000 MG  tablet Take 1,000 mg by mouth daily with breakfast.     . vitamin B-12 (CYANOCOBALAMIN) 1000 MCG tablet Take 1,000 mcg by mouth daily.    . Vitamin D, Ergocalciferol, (DRISDOL) 50000 UNITS CAPS capsule Take 50,000 Units by mouth every 30 (thirty) days.  No current facility-administered medications on file prior to visit.    There are no Patient Instructions on file for this visit. No follow-ups on file.   Kris Hartmann, NP

## 2020-06-27 DIAGNOSIS — M5030 Other cervical disc degeneration, unspecified cervical region: Secondary | ICD-10-CM | POA: Diagnosis not present

## 2020-06-27 DIAGNOSIS — M5412 Radiculopathy, cervical region: Secondary | ICD-10-CM | POA: Diagnosis not present

## 2020-06-27 DIAGNOSIS — M5020 Other cervical disc displacement, unspecified cervical region: Secondary | ICD-10-CM | POA: Diagnosis not present

## 2020-07-05 DIAGNOSIS — I890 Lymphedema, not elsewhere classified: Secondary | ICD-10-CM | POA: Diagnosis not present

## 2020-07-05 DIAGNOSIS — I83022 Varicose veins of left lower extremity with ulcer of calf: Secondary | ICD-10-CM | POA: Diagnosis not present

## 2020-07-05 DIAGNOSIS — E119 Type 2 diabetes mellitus without complications: Secondary | ICD-10-CM | POA: Diagnosis not present

## 2020-07-05 DIAGNOSIS — L97221 Non-pressure chronic ulcer of left calf limited to breakdown of skin: Secondary | ICD-10-CM | POA: Diagnosis not present

## 2020-07-19 DIAGNOSIS — E119 Type 2 diabetes mellitus without complications: Secondary | ICD-10-CM | POA: Diagnosis not present

## 2020-07-19 DIAGNOSIS — I83022 Varicose veins of left lower extremity with ulcer of calf: Secondary | ICD-10-CM | POA: Diagnosis not present

## 2020-07-19 DIAGNOSIS — I890 Lymphedema, not elsewhere classified: Secondary | ICD-10-CM | POA: Diagnosis not present

## 2020-07-19 DIAGNOSIS — L97221 Non-pressure chronic ulcer of left calf limited to breakdown of skin: Secondary | ICD-10-CM | POA: Diagnosis not present

## 2020-07-19 DIAGNOSIS — M5030 Other cervical disc degeneration, unspecified cervical region: Secondary | ICD-10-CM | POA: Diagnosis not present

## 2020-07-19 DIAGNOSIS — M5412 Radiculopathy, cervical region: Secondary | ICD-10-CM | POA: Diagnosis not present

## 2020-07-20 ENCOUNTER — Telehealth: Payer: Self-pay | Admitting: Oncology

## 2020-07-20 NOTE — Telephone Encounter (Signed)
Patient's husband phoned on this date and cancelled patient's appt on 07-25-20. Husband stated that patient will call back later to reschedule.

## 2020-07-25 ENCOUNTER — Ambulatory Visit: Payer: PPO | Admitting: Oncology

## 2020-07-25 ENCOUNTER — Other Ambulatory Visit (INDEPENDENT_AMBULATORY_CARE_PROVIDER_SITE_OTHER): Payer: Self-pay | Admitting: Nurse Practitioner

## 2020-07-25 DIAGNOSIS — M7989 Other specified soft tissue disorders: Secondary | ICD-10-CM

## 2020-07-25 DIAGNOSIS — L97311 Non-pressure chronic ulcer of right ankle limited to breakdown of skin: Secondary | ICD-10-CM

## 2020-07-26 ENCOUNTER — Ambulatory Visit (INDEPENDENT_AMBULATORY_CARE_PROVIDER_SITE_OTHER): Payer: PPO

## 2020-07-26 ENCOUNTER — Ambulatory Visit (INDEPENDENT_AMBULATORY_CARE_PROVIDER_SITE_OTHER): Payer: PPO | Admitting: Vascular Surgery

## 2020-07-26 ENCOUNTER — Other Ambulatory Visit: Payer: Self-pay

## 2020-07-26 ENCOUNTER — Encounter (INDEPENDENT_AMBULATORY_CARE_PROVIDER_SITE_OTHER): Payer: Self-pay | Admitting: Vascular Surgery

## 2020-07-26 VITALS — BP 161/81 | HR 80 | Resp 16

## 2020-07-26 DIAGNOSIS — I890 Lymphedema, not elsewhere classified: Secondary | ICD-10-CM | POA: Diagnosis not present

## 2020-07-26 DIAGNOSIS — E119 Type 2 diabetes mellitus without complications: Secondary | ICD-10-CM

## 2020-07-26 DIAGNOSIS — L97311 Non-pressure chronic ulcer of right ankle limited to breakdown of skin: Secondary | ICD-10-CM

## 2020-07-26 DIAGNOSIS — I10 Essential (primary) hypertension: Secondary | ICD-10-CM

## 2020-07-26 DIAGNOSIS — M7989 Other specified soft tissue disorders: Secondary | ICD-10-CM | POA: Diagnosis not present

## 2020-07-26 NOTE — Assessment & Plan Note (Signed)
Swelling remains persistent.  Venous study was unrevealing.  Unna boots for now and once we get the wounds healed she will need compression stockings and elevation as well as a lymphedema pump.

## 2020-07-26 NOTE — Progress Notes (Signed)
MRN : [id number]  [name] is a 79 y.o. (03-29-1941) female who presents with chief complaint of  Chief Complaint  Patient presents with  . Follow-up    ultrasound follow up  .  History of Present Illness: Patient returns today in follow up of her leg swelling and ulceration.  She continues to have persistent ulcerations around the right ankle and lower leg area.  Her swelling remains present although Unna boots for several weeks have improved the swelling.  She still has a lot of pain in the right leg.  She is worked up with a venous reflux study today showing no significant reflux with the exception of mild right femoral vein reflux.  No DVT or superficial thrombophlebitis is present in either lower extremity  Current Outpatient Medications  Medication Sig Dispense Refill  . acetaminophen (TYLENOL) 500 MG tablet Take 500 mg by mouth QID.    Marland Kitchen alendronate (FOSAMAX) 70 MG tablet Take 70 mg by mouth once a week.     Marland Kitchen allopurinol (ZYLOPRIM) 300 MG tablet Take 300 mg by mouth daily.  11  . atenolol (TENORMIN) 50 MG tablet Take 50 mg by mouth daily.     Marland Kitchen atorvastatin (LIPITOR) 40 MG tablet Take 40 mg by mouth daily.     . diclofenac Sodium (VOLTAREN) 1 % GEL Apply topically.    Marland Kitchen FERREX 150 150 MG capsule Take 150 mg by mouth daily.  5  . furosemide (LASIX) 40 MG tablet Take 40 mg by mouth daily.    Marland Kitchen HYDROcodone-acetaminophen (NORCO) 7.5-325 MG tablet Take 1 tablet by mouth 2 (two) times daily as needed.    Marland Kitchen levothyroxine (SYNTHROID, LEVOTHROID) 75 MCG tablet Take 75 mcg by mouth daily.     Marland Kitchen lisinopril-hydrochlorothiazide (PRINZIDE,ZESTORETIC) 20-12.5 MG tablet Take 1 tablet by mouth daily.     . meloxicam (MOBIC) 7.5 MG tablet Take 7.5 mg by mouth daily.    . metFORMIN (GLUCOPHAGE) 1000 MG tablet Take 1,000 mg by mouth daily with breakfast.     . vitamin B-12 (CYANOCOBALAMIN) 1000 MCG tablet Take 1,000 mcg by mouth daily.    . Vitamin D, Ergocalciferol, (DRISDOL) 50000  UNITS CAPS capsule Take 50,000 Units by mouth every 30 (thirty) days.      No current facility-administered medications for this visit.    Past Medical History:  Diagnosis Date  . Arthritis   . Arthritis   . Breast cancer ([place]) 1992   right breast with lumpectomy and rad tx  . Cancer of right breast ([place]) 04/16/2013   right breast with mastectomy  . Diabetes mellitus without complication ([place])   . Gout   . High cholesterol   . Hyperlipidemia   . Hypertension   . Personal history of radiation therapy     Past Surgical History:  Procedure Laterality Date  . ABDOMINAL HYSTERECTOMY    . ANKLE FRACTURE SURGERY Right 2004  . BREAST LUMPECTOMY Right 1992   positive  . MASTECTOMY Right 2014  . SHOULDER SURGERY Right 2010     Social History   Tobacco Use  . Smoking status: Never Smoker  . Smokeless tobacco: Never Used  Vaping Use  . Vaping Use: Never used  Substance Use Topics  . Alcohol use: No    Alcohol/week: 0.0 standard drinks  . Drug use: No       Family History  Problem Relation Age of Onset  . Breast cancer Neg Hx      Allergies  Allergen Reactions  . Other Anaphylaxis    Anesthisia has been a health issue for her in the past.   . Sulfa Antibiotics Rash     REVIEW OF SYSTEMS (Negative unless checked)  Constitutional: [] Weight loss  [] Fever  [] Chills Cardiac: [] Chest pain   [] Chest pressure   [] Palpitations   [] Shortness of breath when laying flat   [] Shortness of breath at rest   [x] Shortness of breath with exertion. Vascular:  [x] Pain in legs with walking   [x] Pain in legs at rest   [x] Pain in legs when laying flat   [] Claudication   [] Pain in feet when walking  [] Pain in feet at rest  [] Pain in feet when laying flat   [] History of DVT   [] Phlebitis   [x] Swelling in legs   [] Varicose veins   [] Non-healing ulcers Pulmonary:   [] Uses home oxygen   [] Productive cough   [] Hemoptysis   [] Wheeze  [] COPD   [] Asthma Neurologic:  [] Dizziness  [] Blackouts    [] Seizures   [] History of stroke   [] History of TIA  [] Aphasia   [] Temporary blindness   [] Dysphagia   [] Weakness or numbness in arms   [] Weakness or numbness in legs Musculoskeletal:  [x] Arthritis   [] Joint swelling   [] Joint pain   [] Low back pain Hematologic:  [] Easy bruising  [] Easy bleeding   [] Hypercoagulable state   [] Anemic   Gastrointestinal:  [] Blood in stool   [] Vomiting blood  [] Gastroesophageal reflux/heartburn   [] Abdominal pain Genitourinary:  [] Chronic kidney disease   [] Difficult urination  [] Frequent urination  [] Burning with urination   [] Hematuria Skin:  [] Rashes   [x] Ulcers   [x] Wounds Psychological:  [] History of anxiety   []  History of major depression.  Physical Examination  BP (!) 161/81 (BP Location: Right Arm)   Pulse 80   Resp 16  Gen:  WD/WN, NAD Head: /AT, No temporalis wasting. Ear/Nose/Throat: Hearing grossly intact, nares w/o erythema or drainage Eyes: Conjunctiva clear. Sclera non-icteric Neck: Supple.  Trachea midline Pulmonary:  Good air movement, no use of accessory muscles.  Cardiac: Irregular Vascular:  Vessel Right Left  Radial Palpable Palpable                          PT  not palpable  not palpable  DP  1+ palpable  1+ palpable    Musculoskeletal: M/S 5/5 throughout.  No deformity or atrophy.  Scattered ulcerations present in the right lower leg and ankle area.  All relatively clean with good granulation.  1-2+ bilateral lower extremity edema.  In a wheelchair. Neurologic: Sensation grossly intact in extremities.  Symmetrical.  Speech is fluent.  Psychiatric: Judgment intact, Mood & affect appropriate for pt's clinical situation. Dermatologic: Wounds as above       Labs No results found for this or any previous visit (from the past 2160 hour(s)).  Radiology VAS [place] LOWER EXTREMITY VENOUS REFLUX  Result Date: 07/26/2020  Lower Venous Reflux Study Comparison Study: 04/18/2017 Performing Technologist: Charlane Ferretti RT (R)(VS)   Examination Guidelines: A complete evaluation includes B-mode imaging, spectral Doppler, color Doppler, and power Doppler as needed of all accessible portions of each vessel. Bilateral testing is considered an integral part of a complete examination. Limited examinations for reoccurring indications may be performed as noted. The reflux portion of the exam is performed with the patient in reverse Trendelenburg. Significant venous reflux is defined as >500 ms in the superficial venous system, and >1 second in the deep venous  system.  Venous Reflux Times +--------------+---------+----------+-----------+------------+--------+ RIGHT         Reflux NoReflux YesReflux TimeDiameter cmsComments +--------------+---------+----------+-----------+------------+--------+ CFV           no                                                 +--------------+---------+----------+-----------+------------+--------+ FV mid                    yes      1042 ms                       +--------------+---------+----------+-----------+------------+--------+ Popliteal     no                                                 +--------------+---------+----------+-----------+------------+--------+ GSV at SFJ    no                                                 +--------------+---------+----------+-----------+------------+--------+ GSV prox thighno                                                 +--------------+---------+----------+-----------+------------+--------+ GSV mid thigh no                                                 +--------------+---------+----------+-----------+------------+--------+ GSV dist thighno                                                 +--------------+---------+----------+-----------+------------+--------+ GSV at knee   no                                                 +--------------+---------+----------+-----------+------------+--------+ SSV Pop Fossa no                                                  +--------------+---------+----------+-----------+------------+--------+  +--------------+---------+----------+-----------+------------+--------+ LEFT          Reflux NoReflux YesReflux TimeDiameter cmsComments +--------------+---------+----------+-----------+------------+--------+ CFV           no                                                 +--------------+---------+----------+-----------+------------+--------+ FV mid        no                                                 +--------------+---------+----------+-----------+------------+--------+  Popliteal     no                                                 +--------------+---------+----------+-----------+------------+--------+ GSV at SFJ    no                                                 +--------------+---------+----------+-----------+------------+--------+ GSV prox thighno                                                 +--------------+---------+----------+-----------+------------+--------+ GSV mid thigh no                                                 +--------------+---------+----------+-----------+------------+--------+ GSV dist thighno                                                 +--------------+---------+----------+-----------+------------+--------+ GSV at knee   no                                                 +--------------+---------+----------+-----------+------------+--------+ SSV Pop Fossa no                                                 +--------------+---------+----------+-----------+------------+--------+ Summary: Bilateral: - No evidence of deep vein thrombosis seen in the lower extremities, bilaterally, from the common femoral through the popliteal veins. - No evidence of superficial venous thrombosis in the lower extremities, bilaterally. - No evidence of deep venous insufficiency seen bilaterally in the lower  extremity. - No evidence of superficial venous reflux seen in the greater saphenous veins bilaterally. - No evidence of superficial venous reflux seen in the short saphenous veins bilaterally.  *See table(s) above for measurements and observations. Electronically signed by [name] MD on 07/26/2020 at 4:54:43 PM.    Final     Assessment/Plan  Lower limb ulcer, ankle, right, limited to breakdown of skin ([place]) Venous reflux study today was unrevealing with the exception of mild reflux in the right femoral vein.  No superficial reflux, DVT, or superficial thrombophlebitis.  Continue Unna boots and we placed in Unna boots on both lower extremities today due to continued swelling in the left as well.  She has previously had normal arterial studies in the past, but when she returns in 3 months we will recheck those as well.  Lymphedema Swelling remains persistent.  Venous study was unrevealing.  Unna boots for now and once we get the wounds healed she will need compression stockings and elevation as well as a lymphedema  pump.  Diabetes mellitus, type 2 blood glucose control important in reducing the progression of atherosclerotic disease. Also, involved in wound healing. On appropriate medications.   Benign essential hypertension blood pressure control important in reducing the progression of atherosclerotic disease. On appropriate oral medications.     [name], MD  07/26/2020 5:36 PM    This note was created with Dragon medical transcription system.  Any errors from dictation are purely unintentional

## 2020-07-26 NOTE — Assessment & Plan Note (Signed)
blood glucose control important in reducing the progression of atherosclerotic disease. Also, involved in wound healing. On appropriate medications.  

## 2020-07-26 NOTE — Assessment & Plan Note (Signed)
blood pressure control important in reducing the progression of atherosclerotic disease. On appropriate oral medications.  

## 2020-07-26 NOTE — Assessment & Plan Note (Signed)
Venous reflux study today was unrevealing with the exception of mild reflux in the right femoral vein.  No superficial reflux, DVT, or superficial thrombophlebitis.  Continue Unna boots and we placed in Unna boots on both lower extremities today due to continued swelling in the left as well.  She has previously had normal arterial studies in the past, but when she returns in 3 months we will recheck those as well.

## 2020-08-01 DIAGNOSIS — L97221 Non-pressure chronic ulcer of left calf limited to breakdown of skin: Secondary | ICD-10-CM | POA: Diagnosis not present

## 2020-08-01 DIAGNOSIS — E119 Type 2 diabetes mellitus without complications: Secondary | ICD-10-CM | POA: Diagnosis not present

## 2020-08-01 DIAGNOSIS — I83022 Varicose veins of left lower extremity with ulcer of calf: Secondary | ICD-10-CM | POA: Diagnosis not present

## 2020-08-01 DIAGNOSIS — L97512 Non-pressure chronic ulcer of other part of right foot with fat layer exposed: Secondary | ICD-10-CM | POA: Diagnosis not present

## 2020-08-01 DIAGNOSIS — M2012 Hallux valgus (acquired), left foot: Secondary | ICD-10-CM | POA: Diagnosis not present

## 2020-08-01 DIAGNOSIS — M2011 Hallux valgus (acquired), right foot: Secondary | ICD-10-CM | POA: Diagnosis not present

## 2020-08-01 DIAGNOSIS — I890 Lymphedema, not elsewhere classified: Secondary | ICD-10-CM | POA: Diagnosis not present

## 2020-08-08 DIAGNOSIS — I83022 Varicose veins of left lower extremity with ulcer of calf: Secondary | ICD-10-CM | POA: Diagnosis not present

## 2020-08-08 DIAGNOSIS — E119 Type 2 diabetes mellitus without complications: Secondary | ICD-10-CM | POA: Diagnosis not present

## 2020-08-08 DIAGNOSIS — I890 Lymphedema, not elsewhere classified: Secondary | ICD-10-CM | POA: Diagnosis not present

## 2020-08-08 DIAGNOSIS — L97221 Non-pressure chronic ulcer of left calf limited to breakdown of skin: Secondary | ICD-10-CM | POA: Diagnosis not present

## 2020-08-16 DIAGNOSIS — I83022 Varicose veins of left lower extremity with ulcer of calf: Secondary | ICD-10-CM | POA: Diagnosis not present

## 2020-08-16 DIAGNOSIS — L97221 Non-pressure chronic ulcer of left calf limited to breakdown of skin: Secondary | ICD-10-CM | POA: Diagnosis not present

## 2020-08-16 DIAGNOSIS — I890 Lymphedema, not elsewhere classified: Secondary | ICD-10-CM | POA: Diagnosis not present

## 2020-08-16 DIAGNOSIS — E119 Type 2 diabetes mellitus without complications: Secondary | ICD-10-CM | POA: Diagnosis not present

## 2020-08-17 DIAGNOSIS — M5136 Other intervertebral disc degeneration, lumbar region: Secondary | ICD-10-CM | POA: Diagnosis not present

## 2020-08-17 DIAGNOSIS — M48062 Spinal stenosis, lumbar region with neurogenic claudication: Secondary | ICD-10-CM | POA: Diagnosis not present

## 2020-08-17 DIAGNOSIS — M5416 Radiculopathy, lumbar region: Secondary | ICD-10-CM | POA: Diagnosis not present

## 2020-08-23 DIAGNOSIS — E119 Type 2 diabetes mellitus without complications: Secondary | ICD-10-CM | POA: Diagnosis not present

## 2020-08-23 DIAGNOSIS — I890 Lymphedema, not elsewhere classified: Secondary | ICD-10-CM | POA: Diagnosis not present

## 2020-08-23 DIAGNOSIS — L97221 Non-pressure chronic ulcer of left calf limited to breakdown of skin: Secondary | ICD-10-CM | POA: Diagnosis not present

## 2020-08-23 DIAGNOSIS — I83022 Varicose veins of left lower extremity with ulcer of calf: Secondary | ICD-10-CM | POA: Diagnosis not present

## 2020-08-26 DIAGNOSIS — M1712 Unilateral primary osteoarthritis, left knee: Secondary | ICD-10-CM | POA: Diagnosis not present

## 2020-08-26 DIAGNOSIS — E119 Type 2 diabetes mellitus without complications: Secondary | ICD-10-CM | POA: Diagnosis not present

## 2020-08-31 DIAGNOSIS — L97221 Non-pressure chronic ulcer of left calf limited to breakdown of skin: Secondary | ICD-10-CM | POA: Diagnosis not present

## 2020-08-31 DIAGNOSIS — I890 Lymphedema, not elsewhere classified: Secondary | ICD-10-CM | POA: Diagnosis not present

## 2020-08-31 DIAGNOSIS — I83022 Varicose veins of left lower extremity with ulcer of calf: Secondary | ICD-10-CM | POA: Diagnosis not present

## 2020-08-31 DIAGNOSIS — B351 Tinea unguium: Secondary | ICD-10-CM | POA: Diagnosis not present

## 2020-08-31 DIAGNOSIS — M79675 Pain in left toe(s): Secondary | ICD-10-CM | POA: Diagnosis not present

## 2020-08-31 DIAGNOSIS — M79674 Pain in right toe(s): Secondary | ICD-10-CM | POA: Diagnosis not present

## 2020-08-31 DIAGNOSIS — E119 Type 2 diabetes mellitus without complications: Secondary | ICD-10-CM | POA: Diagnosis not present

## 2020-09-08 DIAGNOSIS — M5030 Other cervical disc degeneration, unspecified cervical region: Secondary | ICD-10-CM | POA: Diagnosis not present

## 2020-09-08 DIAGNOSIS — M48062 Spinal stenosis, lumbar region with neurogenic claudication: Secondary | ICD-10-CM | POA: Diagnosis not present

## 2020-09-08 DIAGNOSIS — M5136 Other intervertebral disc degeneration, lumbar region: Secondary | ICD-10-CM | POA: Diagnosis not present

## 2020-09-08 DIAGNOSIS — M5416 Radiculopathy, lumbar region: Secondary | ICD-10-CM | POA: Diagnosis not present

## 2020-09-08 DIAGNOSIS — M5412 Radiculopathy, cervical region: Secondary | ICD-10-CM | POA: Diagnosis not present

## 2020-09-12 DIAGNOSIS — I890 Lymphedema, not elsewhere classified: Secondary | ICD-10-CM | POA: Diagnosis not present

## 2020-09-12 DIAGNOSIS — M542 Cervicalgia: Secondary | ICD-10-CM | POA: Diagnosis not present

## 2020-09-12 DIAGNOSIS — M8580 Other specified disorders of bone density and structure, unspecified site: Secondary | ICD-10-CM | POA: Diagnosis not present

## 2020-09-12 DIAGNOSIS — M75100 Unspecified rotator cuff tear or rupture of unspecified shoulder, not specified as traumatic: Secondary | ICD-10-CM | POA: Diagnosis not present

## 2020-09-12 DIAGNOSIS — Z7984 Long term (current) use of oral hypoglycemic drugs: Secondary | ICD-10-CM | POA: Diagnosis not present

## 2020-09-12 DIAGNOSIS — E785 Hyperlipidemia, unspecified: Secondary | ICD-10-CM | POA: Diagnosis not present

## 2020-09-12 DIAGNOSIS — R32 Unspecified urinary incontinence: Secondary | ICD-10-CM | POA: Diagnosis not present

## 2020-09-12 DIAGNOSIS — I10 Essential (primary) hypertension: Secondary | ICD-10-CM | POA: Diagnosis not present

## 2020-09-12 DIAGNOSIS — M25562 Pain in left knee: Secondary | ICD-10-CM | POA: Diagnosis not present

## 2020-09-12 DIAGNOSIS — Z853 Personal history of malignant neoplasm of breast: Secondary | ICD-10-CM | POA: Diagnosis not present

## 2020-09-12 DIAGNOSIS — Z90710 Acquired absence of both cervix and uterus: Secondary | ICD-10-CM | POA: Diagnosis not present

## 2020-09-12 DIAGNOSIS — M810 Age-related osteoporosis without current pathological fracture: Secondary | ICD-10-CM | POA: Diagnosis not present

## 2020-09-12 DIAGNOSIS — M5116 Intervertebral disc disorders with radiculopathy, lumbar region: Secondary | ICD-10-CM | POA: Diagnosis not present

## 2020-09-12 DIAGNOSIS — Z87442 Personal history of urinary calculi: Secondary | ICD-10-CM | POA: Diagnosis not present

## 2020-09-12 DIAGNOSIS — M48061 Spinal stenosis, lumbar region without neurogenic claudication: Secondary | ICD-10-CM | POA: Diagnosis not present

## 2020-09-12 DIAGNOSIS — Z9011 Acquired absence of right breast and nipple: Secondary | ICD-10-CM | POA: Diagnosis not present

## 2020-09-12 DIAGNOSIS — E119 Type 2 diabetes mellitus without complications: Secondary | ICD-10-CM | POA: Diagnosis not present

## 2020-09-16 DIAGNOSIS — E119 Type 2 diabetes mellitus without complications: Secondary | ICD-10-CM | POA: Diagnosis not present

## 2020-09-16 DIAGNOSIS — M810 Age-related osteoporosis without current pathological fracture: Secondary | ICD-10-CM | POA: Diagnosis not present

## 2020-09-16 DIAGNOSIS — M25562 Pain in left knee: Secondary | ICD-10-CM | POA: Diagnosis not present

## 2020-09-16 DIAGNOSIS — M542 Cervicalgia: Secondary | ICD-10-CM | POA: Diagnosis not present

## 2020-09-16 DIAGNOSIS — E785 Hyperlipidemia, unspecified: Secondary | ICD-10-CM | POA: Diagnosis not present

## 2020-09-16 DIAGNOSIS — Z7984 Long term (current) use of oral hypoglycemic drugs: Secondary | ICD-10-CM | POA: Diagnosis not present

## 2020-09-16 DIAGNOSIS — M8580 Other specified disorders of bone density and structure, unspecified site: Secondary | ICD-10-CM | POA: Diagnosis not present

## 2020-09-16 DIAGNOSIS — I10 Essential (primary) hypertension: Secondary | ICD-10-CM | POA: Diagnosis not present

## 2020-09-16 DIAGNOSIS — R32 Unspecified urinary incontinence: Secondary | ICD-10-CM | POA: Diagnosis not present

## 2020-09-16 DIAGNOSIS — Z853 Personal history of malignant neoplasm of breast: Secondary | ICD-10-CM | POA: Diagnosis not present

## 2020-09-16 DIAGNOSIS — M75100 Unspecified rotator cuff tear or rupture of unspecified shoulder, not specified as traumatic: Secondary | ICD-10-CM | POA: Diagnosis not present

## 2020-09-16 DIAGNOSIS — Z87442 Personal history of urinary calculi: Secondary | ICD-10-CM | POA: Diagnosis not present

## 2020-09-16 DIAGNOSIS — Z9011 Acquired absence of right breast and nipple: Secondary | ICD-10-CM | POA: Diagnosis not present

## 2020-09-16 DIAGNOSIS — I890 Lymphedema, not elsewhere classified: Secondary | ICD-10-CM | POA: Diagnosis not present

## 2020-09-16 DIAGNOSIS — M48061 Spinal stenosis, lumbar region without neurogenic claudication: Secondary | ICD-10-CM | POA: Diagnosis not present

## 2020-09-16 DIAGNOSIS — Z90710 Acquired absence of both cervix and uterus: Secondary | ICD-10-CM | POA: Diagnosis not present

## 2020-09-16 DIAGNOSIS — M5116 Intervertebral disc disorders with radiculopathy, lumbar region: Secondary | ICD-10-CM | POA: Diagnosis not present

## 2020-09-20 DIAGNOSIS — M5412 Radiculopathy, cervical region: Secondary | ICD-10-CM | POA: Diagnosis not present

## 2020-09-20 DIAGNOSIS — M5030 Other cervical disc degeneration, unspecified cervical region: Secondary | ICD-10-CM | POA: Diagnosis not present

## 2020-09-27 DIAGNOSIS — M25562 Pain in left knee: Secondary | ICD-10-CM | POA: Diagnosis not present

## 2020-09-27 DIAGNOSIS — Z87442 Personal history of urinary calculi: Secondary | ICD-10-CM | POA: Diagnosis not present

## 2020-09-27 DIAGNOSIS — M8580 Other specified disorders of bone density and structure, unspecified site: Secondary | ICD-10-CM | POA: Diagnosis not present

## 2020-09-27 DIAGNOSIS — E785 Hyperlipidemia, unspecified: Secondary | ICD-10-CM | POA: Diagnosis not present

## 2020-09-27 DIAGNOSIS — Z90710 Acquired absence of both cervix and uterus: Secondary | ICD-10-CM | POA: Diagnosis not present

## 2020-09-27 DIAGNOSIS — R32 Unspecified urinary incontinence: Secondary | ICD-10-CM | POA: Diagnosis not present

## 2020-09-27 DIAGNOSIS — M810 Age-related osteoporosis without current pathological fracture: Secondary | ICD-10-CM | POA: Diagnosis not present

## 2020-09-27 DIAGNOSIS — M542 Cervicalgia: Secondary | ICD-10-CM | POA: Diagnosis not present

## 2020-09-27 DIAGNOSIS — Z9011 Acquired absence of right breast and nipple: Secondary | ICD-10-CM | POA: Diagnosis not present

## 2020-09-27 DIAGNOSIS — Z853 Personal history of malignant neoplasm of breast: Secondary | ICD-10-CM | POA: Diagnosis not present

## 2020-09-27 DIAGNOSIS — E119 Type 2 diabetes mellitus without complications: Secondary | ICD-10-CM | POA: Diagnosis not present

## 2020-09-27 DIAGNOSIS — Z7984 Long term (current) use of oral hypoglycemic drugs: Secondary | ICD-10-CM | POA: Diagnosis not present

## 2020-09-27 DIAGNOSIS — I10 Essential (primary) hypertension: Secondary | ICD-10-CM | POA: Diagnosis not present

## 2020-09-27 DIAGNOSIS — M75100 Unspecified rotator cuff tear or rupture of unspecified shoulder, not specified as traumatic: Secondary | ICD-10-CM | POA: Diagnosis not present

## 2020-09-27 DIAGNOSIS — I890 Lymphedema, not elsewhere classified: Secondary | ICD-10-CM | POA: Diagnosis not present

## 2020-09-27 DIAGNOSIS — M48061 Spinal stenosis, lumbar region without neurogenic claudication: Secondary | ICD-10-CM | POA: Diagnosis not present

## 2020-09-27 DIAGNOSIS — M5116 Intervertebral disc disorders with radiculopathy, lumbar region: Secondary | ICD-10-CM | POA: Diagnosis not present

## 2020-10-12 DIAGNOSIS — I10 Essential (primary) hypertension: Secondary | ICD-10-CM | POA: Diagnosis not present

## 2020-10-12 DIAGNOSIS — M5116 Intervertebral disc disorders with radiculopathy, lumbar region: Secondary | ICD-10-CM | POA: Diagnosis not present

## 2020-10-12 DIAGNOSIS — M75100 Unspecified rotator cuff tear or rupture of unspecified shoulder, not specified as traumatic: Secondary | ICD-10-CM | POA: Diagnosis not present

## 2020-10-12 DIAGNOSIS — R32 Unspecified urinary incontinence: Secondary | ICD-10-CM | POA: Diagnosis not present

## 2020-10-12 DIAGNOSIS — Z853 Personal history of malignant neoplasm of breast: Secondary | ICD-10-CM | POA: Diagnosis not present

## 2020-10-12 DIAGNOSIS — M542 Cervicalgia: Secondary | ICD-10-CM | POA: Diagnosis not present

## 2020-10-12 DIAGNOSIS — E785 Hyperlipidemia, unspecified: Secondary | ICD-10-CM | POA: Diagnosis not present

## 2020-10-12 DIAGNOSIS — Z90710 Acquired absence of both cervix and uterus: Secondary | ICD-10-CM | POA: Diagnosis not present

## 2020-10-12 DIAGNOSIS — E119 Type 2 diabetes mellitus without complications: Secondary | ICD-10-CM | POA: Diagnosis not present

## 2020-10-12 DIAGNOSIS — Z7984 Long term (current) use of oral hypoglycemic drugs: Secondary | ICD-10-CM | POA: Diagnosis not present

## 2020-10-12 DIAGNOSIS — Z9011 Acquired absence of right breast and nipple: Secondary | ICD-10-CM | POA: Diagnosis not present

## 2020-10-12 DIAGNOSIS — I890 Lymphedema, not elsewhere classified: Secondary | ICD-10-CM | POA: Diagnosis not present

## 2020-10-12 DIAGNOSIS — M810 Age-related osteoporosis without current pathological fracture: Secondary | ICD-10-CM | POA: Diagnosis not present

## 2020-10-12 DIAGNOSIS — Z87442 Personal history of urinary calculi: Secondary | ICD-10-CM | POA: Diagnosis not present

## 2020-10-12 DIAGNOSIS — M48061 Spinal stenosis, lumbar region without neurogenic claudication: Secondary | ICD-10-CM | POA: Diagnosis not present

## 2020-10-12 DIAGNOSIS — M25562 Pain in left knee: Secondary | ICD-10-CM | POA: Diagnosis not present

## 2020-10-12 DIAGNOSIS — M8580 Other specified disorders of bone density and structure, unspecified site: Secondary | ICD-10-CM | POA: Diagnosis not present

## 2020-10-19 DIAGNOSIS — Z7984 Long term (current) use of oral hypoglycemic drugs: Secondary | ICD-10-CM | POA: Diagnosis not present

## 2020-10-19 DIAGNOSIS — Z90710 Acquired absence of both cervix and uterus: Secondary | ICD-10-CM | POA: Diagnosis not present

## 2020-10-19 DIAGNOSIS — M75100 Unspecified rotator cuff tear or rupture of unspecified shoulder, not specified as traumatic: Secondary | ICD-10-CM | POA: Diagnosis not present

## 2020-10-19 DIAGNOSIS — I10 Essential (primary) hypertension: Secondary | ICD-10-CM | POA: Diagnosis not present

## 2020-10-19 DIAGNOSIS — R32 Unspecified urinary incontinence: Secondary | ICD-10-CM | POA: Diagnosis not present

## 2020-10-19 DIAGNOSIS — Z853 Personal history of malignant neoplasm of breast: Secondary | ICD-10-CM | POA: Diagnosis not present

## 2020-10-19 DIAGNOSIS — M810 Age-related osteoporosis without current pathological fracture: Secondary | ICD-10-CM | POA: Diagnosis not present

## 2020-10-19 DIAGNOSIS — E785 Hyperlipidemia, unspecified: Secondary | ICD-10-CM | POA: Diagnosis not present

## 2020-10-19 DIAGNOSIS — M542 Cervicalgia: Secondary | ICD-10-CM | POA: Diagnosis not present

## 2020-10-19 DIAGNOSIS — M5116 Intervertebral disc disorders with radiculopathy, lumbar region: Secondary | ICD-10-CM | POA: Diagnosis not present

## 2020-10-19 DIAGNOSIS — I890 Lymphedema, not elsewhere classified: Secondary | ICD-10-CM | POA: Diagnosis not present

## 2020-10-19 DIAGNOSIS — Z87442 Personal history of urinary calculi: Secondary | ICD-10-CM | POA: Diagnosis not present

## 2020-10-19 DIAGNOSIS — M8580 Other specified disorders of bone density and structure, unspecified site: Secondary | ICD-10-CM | POA: Diagnosis not present

## 2020-10-19 DIAGNOSIS — M48061 Spinal stenosis, lumbar region without neurogenic claudication: Secondary | ICD-10-CM | POA: Diagnosis not present

## 2020-10-19 DIAGNOSIS — Z9011 Acquired absence of right breast and nipple: Secondary | ICD-10-CM | POA: Diagnosis not present

## 2020-10-19 DIAGNOSIS — M25562 Pain in left knee: Secondary | ICD-10-CM | POA: Diagnosis not present

## 2020-10-19 DIAGNOSIS — E119 Type 2 diabetes mellitus without complications: Secondary | ICD-10-CM | POA: Diagnosis not present

## 2020-10-20 DIAGNOSIS — M5136 Other intervertebral disc degeneration, lumbar region: Secondary | ICD-10-CM | POA: Diagnosis not present

## 2020-10-20 DIAGNOSIS — M48062 Spinal stenosis, lumbar region with neurogenic claudication: Secondary | ICD-10-CM | POA: Diagnosis not present

## 2020-10-20 DIAGNOSIS — M5416 Radiculopathy, lumbar region: Secondary | ICD-10-CM | POA: Diagnosis not present

## 2020-10-27 DIAGNOSIS — E119 Type 2 diabetes mellitus without complications: Secondary | ICD-10-CM | POA: Diagnosis not present

## 2020-10-27 DIAGNOSIS — M542 Cervicalgia: Secondary | ICD-10-CM | POA: Diagnosis not present

## 2020-10-27 DIAGNOSIS — E785 Hyperlipidemia, unspecified: Secondary | ICD-10-CM | POA: Diagnosis not present

## 2020-10-27 DIAGNOSIS — M75100 Unspecified rotator cuff tear or rupture of unspecified shoulder, not specified as traumatic: Secondary | ICD-10-CM | POA: Diagnosis not present

## 2020-10-27 DIAGNOSIS — I10 Essential (primary) hypertension: Secondary | ICD-10-CM | POA: Diagnosis not present

## 2020-10-27 DIAGNOSIS — I890 Lymphedema, not elsewhere classified: Secondary | ICD-10-CM | POA: Diagnosis not present

## 2020-10-27 DIAGNOSIS — Z853 Personal history of malignant neoplasm of breast: Secondary | ICD-10-CM | POA: Diagnosis not present

## 2020-10-27 DIAGNOSIS — M8580 Other specified disorders of bone density and structure, unspecified site: Secondary | ICD-10-CM | POA: Diagnosis not present

## 2020-10-27 DIAGNOSIS — R32 Unspecified urinary incontinence: Secondary | ICD-10-CM | POA: Diagnosis not present

## 2020-10-27 DIAGNOSIS — Z87442 Personal history of urinary calculi: Secondary | ICD-10-CM | POA: Diagnosis not present

## 2020-10-27 DIAGNOSIS — M48061 Spinal stenosis, lumbar region without neurogenic claudication: Secondary | ICD-10-CM | POA: Diagnosis not present

## 2020-10-27 DIAGNOSIS — Z7984 Long term (current) use of oral hypoglycemic drugs: Secondary | ICD-10-CM | POA: Diagnosis not present

## 2020-10-27 DIAGNOSIS — Z90710 Acquired absence of both cervix and uterus: Secondary | ICD-10-CM | POA: Diagnosis not present

## 2020-10-27 DIAGNOSIS — M5116 Intervertebral disc disorders with radiculopathy, lumbar region: Secondary | ICD-10-CM | POA: Diagnosis not present

## 2020-10-27 DIAGNOSIS — M810 Age-related osteoporosis without current pathological fracture: Secondary | ICD-10-CM | POA: Diagnosis not present

## 2020-10-27 DIAGNOSIS — M25562 Pain in left knee: Secondary | ICD-10-CM | POA: Diagnosis not present

## 2020-10-27 DIAGNOSIS — Z9011 Acquired absence of right breast and nipple: Secondary | ICD-10-CM | POA: Diagnosis not present

## 2020-11-01 ENCOUNTER — Encounter (INDEPENDENT_AMBULATORY_CARE_PROVIDER_SITE_OTHER): Payer: PPO

## 2020-11-01 ENCOUNTER — Ambulatory Visit (INDEPENDENT_AMBULATORY_CARE_PROVIDER_SITE_OTHER): Payer: PPO | Admitting: Vascular Surgery

## 2020-11-16 DIAGNOSIS — M810 Age-related osteoporosis without current pathological fracture: Secondary | ICD-10-CM | POA: Diagnosis not present

## 2020-11-16 DIAGNOSIS — M25562 Pain in left knee: Secondary | ICD-10-CM | POA: Diagnosis not present

## 2020-11-16 DIAGNOSIS — M542 Cervicalgia: Secondary | ICD-10-CM | POA: Diagnosis not present

## 2020-11-16 DIAGNOSIS — E119 Type 2 diabetes mellitus without complications: Secondary | ICD-10-CM | POA: Diagnosis not present

## 2020-11-16 DIAGNOSIS — R32 Unspecified urinary incontinence: Secondary | ICD-10-CM | POA: Diagnosis not present

## 2020-11-16 DIAGNOSIS — M75100 Unspecified rotator cuff tear or rupture of unspecified shoulder, not specified as traumatic: Secondary | ICD-10-CM | POA: Diagnosis not present

## 2020-11-16 DIAGNOSIS — Z853 Personal history of malignant neoplasm of breast: Secondary | ICD-10-CM | POA: Diagnosis not present

## 2020-11-16 DIAGNOSIS — Z87442 Personal history of urinary calculi: Secondary | ICD-10-CM | POA: Diagnosis not present

## 2020-11-16 DIAGNOSIS — M48061 Spinal stenosis, lumbar region without neurogenic claudication: Secondary | ICD-10-CM | POA: Diagnosis not present

## 2020-11-16 DIAGNOSIS — I890 Lymphedema, not elsewhere classified: Secondary | ICD-10-CM | POA: Diagnosis not present

## 2020-11-16 DIAGNOSIS — I10 Essential (primary) hypertension: Secondary | ICD-10-CM | POA: Diagnosis not present

## 2020-11-16 DIAGNOSIS — Z90710 Acquired absence of both cervix and uterus: Secondary | ICD-10-CM | POA: Diagnosis not present

## 2020-11-16 DIAGNOSIS — M8580 Other specified disorders of bone density and structure, unspecified site: Secondary | ICD-10-CM | POA: Diagnosis not present

## 2020-11-16 DIAGNOSIS — Z7984 Long term (current) use of oral hypoglycemic drugs: Secondary | ICD-10-CM | POA: Diagnosis not present

## 2020-11-16 DIAGNOSIS — M5116 Intervertebral disc disorders with radiculopathy, lumbar region: Secondary | ICD-10-CM | POA: Diagnosis not present

## 2020-11-16 DIAGNOSIS — Z9011 Acquired absence of right breast and nipple: Secondary | ICD-10-CM | POA: Diagnosis not present

## 2020-11-16 DIAGNOSIS — E785 Hyperlipidemia, unspecified: Secondary | ICD-10-CM | POA: Diagnosis not present

## 2020-11-30 DIAGNOSIS — Z87442 Personal history of urinary calculi: Secondary | ICD-10-CM | POA: Diagnosis not present

## 2020-11-30 DIAGNOSIS — M8580 Other specified disorders of bone density and structure, unspecified site: Secondary | ICD-10-CM | POA: Diagnosis not present

## 2020-11-30 DIAGNOSIS — R32 Unspecified urinary incontinence: Secondary | ICD-10-CM | POA: Diagnosis not present

## 2020-11-30 DIAGNOSIS — E119 Type 2 diabetes mellitus without complications: Secondary | ICD-10-CM | POA: Diagnosis not present

## 2020-11-30 DIAGNOSIS — Z9011 Acquired absence of right breast and nipple: Secondary | ICD-10-CM | POA: Diagnosis not present

## 2020-11-30 DIAGNOSIS — M542 Cervicalgia: Secondary | ICD-10-CM | POA: Diagnosis not present

## 2020-11-30 DIAGNOSIS — I10 Essential (primary) hypertension: Secondary | ICD-10-CM | POA: Diagnosis not present

## 2020-11-30 DIAGNOSIS — Z7984 Long term (current) use of oral hypoglycemic drugs: Secondary | ICD-10-CM | POA: Diagnosis not present

## 2020-11-30 DIAGNOSIS — Z90710 Acquired absence of both cervix and uterus: Secondary | ICD-10-CM | POA: Diagnosis not present

## 2020-11-30 DIAGNOSIS — M75100 Unspecified rotator cuff tear or rupture of unspecified shoulder, not specified as traumatic: Secondary | ICD-10-CM | POA: Diagnosis not present

## 2020-11-30 DIAGNOSIS — M5116 Intervertebral disc disorders with radiculopathy, lumbar region: Secondary | ICD-10-CM | POA: Diagnosis not present

## 2020-11-30 DIAGNOSIS — M810 Age-related osteoporosis without current pathological fracture: Secondary | ICD-10-CM | POA: Diagnosis not present

## 2020-11-30 DIAGNOSIS — I890 Lymphedema, not elsewhere classified: Secondary | ICD-10-CM | POA: Diagnosis not present

## 2020-11-30 DIAGNOSIS — M48061 Spinal stenosis, lumbar region without neurogenic claudication: Secondary | ICD-10-CM | POA: Diagnosis not present

## 2020-11-30 DIAGNOSIS — E785 Hyperlipidemia, unspecified: Secondary | ICD-10-CM | POA: Diagnosis not present

## 2020-11-30 DIAGNOSIS — Z853 Personal history of malignant neoplasm of breast: Secondary | ICD-10-CM | POA: Diagnosis not present

## 2020-11-30 DIAGNOSIS — M25562 Pain in left knee: Secondary | ICD-10-CM | POA: Diagnosis not present

## 2020-12-01 DIAGNOSIS — M1712 Unilateral primary osteoarthritis, left knee: Secondary | ICD-10-CM | POA: Diagnosis not present

## 2020-12-01 DIAGNOSIS — E119 Type 2 diabetes mellitus without complications: Secondary | ICD-10-CM | POA: Diagnosis not present

## 2020-12-20 DIAGNOSIS — M810 Age-related osteoporosis without current pathological fracture: Secondary | ICD-10-CM | POA: Diagnosis not present

## 2020-12-20 DIAGNOSIS — E785 Hyperlipidemia, unspecified: Secondary | ICD-10-CM | POA: Diagnosis not present

## 2020-12-20 DIAGNOSIS — Z9011 Acquired absence of right breast and nipple: Secondary | ICD-10-CM | POA: Diagnosis not present

## 2020-12-20 DIAGNOSIS — Z853 Personal history of malignant neoplasm of breast: Secondary | ICD-10-CM | POA: Diagnosis not present

## 2020-12-20 DIAGNOSIS — M5116 Intervertebral disc disorders with radiculopathy, lumbar region: Secondary | ICD-10-CM | POA: Diagnosis not present

## 2020-12-20 DIAGNOSIS — Z87442 Personal history of urinary calculi: Secondary | ICD-10-CM | POA: Diagnosis not present

## 2020-12-20 DIAGNOSIS — M25562 Pain in left knee: Secondary | ICD-10-CM | POA: Diagnosis not present

## 2020-12-20 DIAGNOSIS — I890 Lymphedema, not elsewhere classified: Secondary | ICD-10-CM | POA: Diagnosis not present

## 2020-12-20 DIAGNOSIS — I10 Essential (primary) hypertension: Secondary | ICD-10-CM | POA: Diagnosis not present

## 2020-12-20 DIAGNOSIS — R32 Unspecified urinary incontinence: Secondary | ICD-10-CM | POA: Diagnosis not present

## 2020-12-20 DIAGNOSIS — M542 Cervicalgia: Secondary | ICD-10-CM | POA: Diagnosis not present

## 2020-12-20 DIAGNOSIS — E119 Type 2 diabetes mellitus without complications: Secondary | ICD-10-CM | POA: Diagnosis not present

## 2020-12-20 DIAGNOSIS — M8580 Other specified disorders of bone density and structure, unspecified site: Secondary | ICD-10-CM | POA: Diagnosis not present

## 2020-12-20 DIAGNOSIS — Z90710 Acquired absence of both cervix and uterus: Secondary | ICD-10-CM | POA: Diagnosis not present

## 2020-12-20 DIAGNOSIS — M75100 Unspecified rotator cuff tear or rupture of unspecified shoulder, not specified as traumatic: Secondary | ICD-10-CM | POA: Diagnosis not present

## 2020-12-20 DIAGNOSIS — Z7984 Long term (current) use of oral hypoglycemic drugs: Secondary | ICD-10-CM | POA: Diagnosis not present

## 2020-12-20 DIAGNOSIS — M48061 Spinal stenosis, lumbar region without neurogenic claudication: Secondary | ICD-10-CM | POA: Diagnosis not present

## 2020-12-26 DIAGNOSIS — M5136 Other intervertebral disc degeneration, lumbar region: Secondary | ICD-10-CM | POA: Diagnosis not present

## 2020-12-26 DIAGNOSIS — M48062 Spinal stenosis, lumbar region with neurogenic claudication: Secondary | ICD-10-CM | POA: Diagnosis not present

## 2020-12-26 DIAGNOSIS — M5412 Radiculopathy, cervical region: Secondary | ICD-10-CM | POA: Diagnosis not present

## 2020-12-26 DIAGNOSIS — M5030 Other cervical disc degeneration, unspecified cervical region: Secondary | ICD-10-CM | POA: Diagnosis not present

## 2020-12-26 DIAGNOSIS — M5416 Radiculopathy, lumbar region: Secondary | ICD-10-CM | POA: Diagnosis not present

## 2021-01-04 DIAGNOSIS — M48062 Spinal stenosis, lumbar region with neurogenic claudication: Secondary | ICD-10-CM | POA: Diagnosis not present

## 2021-01-04 DIAGNOSIS — M5416 Radiculopathy, lumbar region: Secondary | ICD-10-CM | POA: Diagnosis not present

## 2021-01-13 DIAGNOSIS — E1165 Type 2 diabetes mellitus with hyperglycemia: Secondary | ICD-10-CM | POA: Diagnosis not present

## 2021-01-13 DIAGNOSIS — I10 Essential (primary) hypertension: Secondary | ICD-10-CM | POA: Diagnosis not present

## 2021-01-13 DIAGNOSIS — L97911 Non-pressure chronic ulcer of unspecified part of right lower leg limited to breakdown of skin: Secondary | ICD-10-CM | POA: Diagnosis not present

## 2021-01-17 DIAGNOSIS — M48062 Spinal stenosis, lumbar region with neurogenic claudication: Secondary | ICD-10-CM | POA: Diagnosis not present

## 2021-01-17 DIAGNOSIS — M5136 Other intervertebral disc degeneration, lumbar region: Secondary | ICD-10-CM | POA: Diagnosis not present

## 2021-01-17 DIAGNOSIS — M5030 Other cervical disc degeneration, unspecified cervical region: Secondary | ICD-10-CM | POA: Diagnosis not present

## 2021-01-17 DIAGNOSIS — M5416 Radiculopathy, lumbar region: Secondary | ICD-10-CM | POA: Diagnosis not present

## 2021-01-17 DIAGNOSIS — I10 Essential (primary) hypertension: Secondary | ICD-10-CM | POA: Diagnosis not present

## 2021-01-17 DIAGNOSIS — L97911 Non-pressure chronic ulcer of unspecified part of right lower leg limited to breakdown of skin: Secondary | ICD-10-CM | POA: Diagnosis not present

## 2021-01-17 DIAGNOSIS — E1165 Type 2 diabetes mellitus with hyperglycemia: Secondary | ICD-10-CM | POA: Diagnosis not present

## 2021-01-17 DIAGNOSIS — Z79899 Other long term (current) drug therapy: Secondary | ICD-10-CM | POA: Diagnosis not present

## 2021-01-17 DIAGNOSIS — M5412 Radiculopathy, cervical region: Secondary | ICD-10-CM | POA: Diagnosis not present

## 2021-01-23 DIAGNOSIS — I10 Essential (primary) hypertension: Secondary | ICD-10-CM | POA: Diagnosis not present

## 2021-01-23 DIAGNOSIS — L97911 Non-pressure chronic ulcer of unspecified part of right lower leg limited to breakdown of skin: Secondary | ICD-10-CM | POA: Diagnosis not present

## 2021-01-23 DIAGNOSIS — E1165 Type 2 diabetes mellitus with hyperglycemia: Secondary | ICD-10-CM | POA: Diagnosis not present

## 2021-01-26 DIAGNOSIS — M4802 Spinal stenosis, cervical region: Secondary | ICD-10-CM | POA: Diagnosis not present

## 2021-01-26 DIAGNOSIS — M5030 Other cervical disc degeneration, unspecified cervical region: Secondary | ICD-10-CM | POA: Diagnosis not present

## 2021-01-26 DIAGNOSIS — M5412 Radiculopathy, cervical region: Secondary | ICD-10-CM | POA: Diagnosis not present

## 2021-01-27 ENCOUNTER — Other Ambulatory Visit: Payer: Self-pay | Admitting: Internal Medicine

## 2021-01-27 DIAGNOSIS — Z1231 Encounter for screening mammogram for malignant neoplasm of breast: Secondary | ICD-10-CM

## 2021-01-30 DIAGNOSIS — E1165 Type 2 diabetes mellitus with hyperglycemia: Secondary | ICD-10-CM | POA: Diagnosis not present

## 2021-01-30 DIAGNOSIS — L97911 Non-pressure chronic ulcer of unspecified part of right lower leg limited to breakdown of skin: Secondary | ICD-10-CM | POA: Diagnosis not present

## 2021-01-30 DIAGNOSIS — I10 Essential (primary) hypertension: Secondary | ICD-10-CM | POA: Diagnosis not present

## 2021-02-06 ENCOUNTER — Other Ambulatory Visit: Payer: Self-pay | Admitting: Internal Medicine

## 2021-02-06 ENCOUNTER — Other Ambulatory Visit: Payer: Self-pay

## 2021-02-06 ENCOUNTER — Ambulatory Visit
Admission: RE | Admit: 2021-02-06 | Discharge: 2021-02-06 | Disposition: A | Discharge status: Home / Self Care | Payer: PPO | Source: Ambulatory Visit | Attending: Internal Medicine | Admitting: Internal Medicine

## 2021-02-06 DIAGNOSIS — Z1231 Encounter for screening mammogram for malignant neoplasm of breast: Secondary | ICD-10-CM

## 2021-02-06 DIAGNOSIS — E1165 Type 2 diabetes mellitus with hyperglycemia: Secondary | ICD-10-CM | POA: Diagnosis not present

## 2021-02-06 DIAGNOSIS — L97911 Non-pressure chronic ulcer of unspecified part of right lower leg limited to breakdown of skin: Secondary | ICD-10-CM | POA: Diagnosis not present

## 2021-02-13 DIAGNOSIS — Z9011 Acquired absence of right breast and nipple: Secondary | ICD-10-CM | POA: Diagnosis not present

## 2021-02-13 DIAGNOSIS — L97911 Non-pressure chronic ulcer of unspecified part of right lower leg limited to breakdown of skin: Secondary | ICD-10-CM | POA: Diagnosis not present

## 2021-02-13 DIAGNOSIS — E1165 Type 2 diabetes mellitus with hyperglycemia: Secondary | ICD-10-CM | POA: Diagnosis not present

## 2021-02-20 DIAGNOSIS — Z4431 Encounter for fitting and adjustment of external right breast prosthesis: Secondary | ICD-10-CM | POA: Diagnosis not present

## 2021-02-20 DIAGNOSIS — L97911 Non-pressure chronic ulcer of unspecified part of right lower leg limited to breakdown of skin: Secondary | ICD-10-CM | POA: Diagnosis not present

## 2021-02-20 DIAGNOSIS — I10 Essential (primary) hypertension: Secondary | ICD-10-CM | POA: Diagnosis not present

## 2021-02-20 DIAGNOSIS — C50111 Malignant neoplasm of central portion of right female breast: Secondary | ICD-10-CM | POA: Diagnosis not present

## 2021-02-20 DIAGNOSIS — E1165 Type 2 diabetes mellitus with hyperglycemia: Secondary | ICD-10-CM | POA: Diagnosis not present

## 2021-02-20 DIAGNOSIS — M1712 Unilateral primary osteoarthritis, left knee: Secondary | ICD-10-CM | POA: Diagnosis not present

## 2021-02-27 DIAGNOSIS — E1165 Type 2 diabetes mellitus with hyperglycemia: Secondary | ICD-10-CM | POA: Diagnosis not present

## 2021-02-27 DIAGNOSIS — I10 Essential (primary) hypertension: Secondary | ICD-10-CM | POA: Diagnosis not present

## 2021-02-27 DIAGNOSIS — L97911 Non-pressure chronic ulcer of unspecified part of right lower leg limited to breakdown of skin: Secondary | ICD-10-CM | POA: Diagnosis not present

## 2021-03-06 DIAGNOSIS — E1165 Type 2 diabetes mellitus with hyperglycemia: Secondary | ICD-10-CM | POA: Diagnosis not present

## 2021-03-06 DIAGNOSIS — L97911 Non-pressure chronic ulcer of unspecified part of right lower leg limited to breakdown of skin: Secondary | ICD-10-CM | POA: Diagnosis not present

## 2021-03-09 DIAGNOSIS — E1165 Type 2 diabetes mellitus with hyperglycemia: Secondary | ICD-10-CM | POA: Diagnosis not present

## 2021-03-09 DIAGNOSIS — I10 Essential (primary) hypertension: Secondary | ICD-10-CM | POA: Diagnosis not present

## 2021-03-09 DIAGNOSIS — L97911 Non-pressure chronic ulcer of unspecified part of right lower leg limited to breakdown of skin: Secondary | ICD-10-CM | POA: Diagnosis not present

## 2021-03-16 DIAGNOSIS — E1165 Type 2 diabetes mellitus with hyperglycemia: Secondary | ICD-10-CM | POA: Diagnosis not present

## 2021-03-16 DIAGNOSIS — L97911 Non-pressure chronic ulcer of unspecified part of right lower leg limited to breakdown of skin: Secondary | ICD-10-CM | POA: Diagnosis not present

## 2021-03-23 DIAGNOSIS — E1165 Type 2 diabetes mellitus with hyperglycemia: Secondary | ICD-10-CM | POA: Diagnosis not present

## 2021-03-23 DIAGNOSIS — L97911 Non-pressure chronic ulcer of unspecified part of right lower leg limited to breakdown of skin: Secondary | ICD-10-CM | POA: Diagnosis not present

## 2021-03-30 DIAGNOSIS — L97911 Non-pressure chronic ulcer of unspecified part of right lower leg limited to breakdown of skin: Secondary | ICD-10-CM | POA: Diagnosis not present

## 2021-03-30 DIAGNOSIS — E1165 Type 2 diabetes mellitus with hyperglycemia: Secondary | ICD-10-CM | POA: Diagnosis not present

## 2021-04-05 DIAGNOSIS — E1165 Type 2 diabetes mellitus with hyperglycemia: Secondary | ICD-10-CM | POA: Diagnosis not present

## 2021-04-05 DIAGNOSIS — L97911 Non-pressure chronic ulcer of unspecified part of right lower leg limited to breakdown of skin: Secondary | ICD-10-CM | POA: Diagnosis not present

## 2021-04-13 DIAGNOSIS — L97911 Non-pressure chronic ulcer of unspecified part of right lower leg limited to breakdown of skin: Secondary | ICD-10-CM | POA: Diagnosis not present

## 2021-04-13 DIAGNOSIS — E1165 Type 2 diabetes mellitus with hyperglycemia: Secondary | ICD-10-CM | POA: Diagnosis not present

## 2021-04-19 DIAGNOSIS — M5412 Radiculopathy, cervical region: Secondary | ICD-10-CM | POA: Diagnosis not present

## 2021-04-19 DIAGNOSIS — M5136 Other intervertebral disc degeneration, lumbar region: Secondary | ICD-10-CM | POA: Diagnosis not present

## 2021-04-19 DIAGNOSIS — M5416 Radiculopathy, lumbar region: Secondary | ICD-10-CM | POA: Diagnosis not present

## 2021-04-19 DIAGNOSIS — M5030 Other cervical disc degeneration, unspecified cervical region: Secondary | ICD-10-CM | POA: Diagnosis not present

## 2021-04-21 DIAGNOSIS — E1165 Type 2 diabetes mellitus with hyperglycemia: Secondary | ICD-10-CM | POA: Diagnosis not present

## 2021-04-21 DIAGNOSIS — R350 Frequency of micturition: Secondary | ICD-10-CM | POA: Diagnosis not present

## 2021-04-21 DIAGNOSIS — L97911 Non-pressure chronic ulcer of unspecified part of right lower leg limited to breakdown of skin: Secondary | ICD-10-CM | POA: Diagnosis not present

## 2021-05-01 DIAGNOSIS — I890 Lymphedema, not elsewhere classified: Secondary | ICD-10-CM | POA: Diagnosis not present

## 2021-05-01 DIAGNOSIS — L97911 Non-pressure chronic ulcer of unspecified part of right lower leg limited to breakdown of skin: Secondary | ICD-10-CM | POA: Diagnosis not present

## 2021-05-01 DIAGNOSIS — N3000 Acute cystitis without hematuria: Secondary | ICD-10-CM | POA: Diagnosis not present

## 2021-05-01 DIAGNOSIS — E1165 Type 2 diabetes mellitus with hyperglycemia: Secondary | ICD-10-CM | POA: Diagnosis not present

## 2021-05-08 DIAGNOSIS — I890 Lymphedema, not elsewhere classified: Secondary | ICD-10-CM | POA: Diagnosis not present

## 2021-05-08 DIAGNOSIS — L97911 Non-pressure chronic ulcer of unspecified part of right lower leg limited to breakdown of skin: Secondary | ICD-10-CM | POA: Diagnosis not present

## 2021-05-22 DIAGNOSIS — I890 Lymphedema, not elsewhere classified: Secondary | ICD-10-CM | POA: Diagnosis not present

## 2021-05-22 DIAGNOSIS — E1165 Type 2 diabetes mellitus with hyperglycemia: Secondary | ICD-10-CM | POA: Diagnosis not present

## 2021-05-22 DIAGNOSIS — L03119 Cellulitis of unspecified part of limb: Secondary | ICD-10-CM | POA: Diagnosis not present

## 2021-05-24 DIAGNOSIS — E1165 Type 2 diabetes mellitus with hyperglycemia: Secondary | ICD-10-CM | POA: Diagnosis not present

## 2021-05-24 DIAGNOSIS — L03119 Cellulitis of unspecified part of limb: Secondary | ICD-10-CM | POA: Diagnosis not present

## 2021-05-24 DIAGNOSIS — I890 Lymphedema, not elsewhere classified: Secondary | ICD-10-CM | POA: Diagnosis not present

## 2021-05-29 DIAGNOSIS — M25562 Pain in left knee: Secondary | ICD-10-CM | POA: Diagnosis not present

## 2021-05-29 DIAGNOSIS — L97919 Non-pressure chronic ulcer of unspecified part of right lower leg with unspecified severity: Secondary | ICD-10-CM | POA: Diagnosis not present

## 2021-05-29 DIAGNOSIS — G8929 Other chronic pain: Secondary | ICD-10-CM | POA: Diagnosis not present

## 2021-05-29 DIAGNOSIS — I83019 Varicose veins of right lower extremity with ulcer of unspecified site: Secondary | ICD-10-CM | POA: Diagnosis not present

## 2021-05-29 DIAGNOSIS — I10 Essential (primary) hypertension: Secondary | ICD-10-CM | POA: Diagnosis not present

## 2021-05-29 DIAGNOSIS — E1165 Type 2 diabetes mellitus with hyperglycemia: Secondary | ICD-10-CM | POA: Diagnosis not present

## 2021-05-29 DIAGNOSIS — Z853 Personal history of malignant neoplasm of breast: Secondary | ICD-10-CM | POA: Diagnosis not present

## 2021-05-29 DIAGNOSIS — Z8739 Personal history of other diseases of the musculoskeletal system and connective tissue: Secondary | ICD-10-CM | POA: Diagnosis not present

## 2021-05-29 DIAGNOSIS — I890 Lymphedema, not elsewhere classified: Secondary | ICD-10-CM | POA: Diagnosis not present

## 2021-05-29 DIAGNOSIS — R600 Localized edema: Secondary | ICD-10-CM | POA: Diagnosis not present

## 2021-05-29 DIAGNOSIS — N1831 Chronic kidney disease, stage 3a: Secondary | ICD-10-CM | POA: Diagnosis not present

## 2021-05-29 DIAGNOSIS — M25561 Pain in right knee: Secondary | ICD-10-CM | POA: Diagnosis not present

## 2021-05-29 DIAGNOSIS — Z79899 Other long term (current) drug therapy: Secondary | ICD-10-CM | POA: Diagnosis not present

## 2021-05-29 DIAGNOSIS — D649 Anemia, unspecified: Secondary | ICD-10-CM | POA: Diagnosis not present

## 2021-06-05 DIAGNOSIS — L97919 Non-pressure chronic ulcer of unspecified part of right lower leg with unspecified severity: Secondary | ICD-10-CM | POA: Diagnosis not present

## 2021-06-05 DIAGNOSIS — I83019 Varicose veins of right lower extremity with ulcer of unspecified site: Secondary | ICD-10-CM | POA: Diagnosis not present

## 2021-06-05 DIAGNOSIS — I890 Lymphedema, not elsewhere classified: Secondary | ICD-10-CM | POA: Diagnosis not present

## 2021-06-12 DIAGNOSIS — I890 Lymphedema, not elsewhere classified: Secondary | ICD-10-CM | POA: Diagnosis not present

## 2021-06-12 DIAGNOSIS — M1712 Unilateral primary osteoarthritis, left knee: Secondary | ICD-10-CM | POA: Diagnosis not present

## 2021-06-19 DIAGNOSIS — I83019 Varicose veins of right lower extremity with ulcer of unspecified site: Secondary | ICD-10-CM | POA: Diagnosis not present

## 2021-06-19 DIAGNOSIS — M25512 Pain in left shoulder: Secondary | ICD-10-CM | POA: Diagnosis not present

## 2021-06-19 DIAGNOSIS — L97919 Non-pressure chronic ulcer of unspecified part of right lower leg with unspecified severity: Secondary | ICD-10-CM | POA: Diagnosis not present

## 2021-06-19 DIAGNOSIS — I890 Lymphedema, not elsewhere classified: Secondary | ICD-10-CM | POA: Diagnosis not present

## 2021-07-03 DIAGNOSIS — M7542 Impingement syndrome of left shoulder: Secondary | ICD-10-CM | POA: Diagnosis not present

## 2021-07-19 DIAGNOSIS — M5416 Radiculopathy, lumbar region: Secondary | ICD-10-CM | POA: Diagnosis not present

## 2021-07-19 DIAGNOSIS — M7542 Impingement syndrome of left shoulder: Secondary | ICD-10-CM | POA: Diagnosis not present

## 2021-07-19 DIAGNOSIS — M4802 Spinal stenosis, cervical region: Secondary | ICD-10-CM | POA: Diagnosis not present

## 2021-07-19 DIAGNOSIS — M5030 Other cervical disc degeneration, unspecified cervical region: Secondary | ICD-10-CM | POA: Diagnosis not present

## 2021-07-19 DIAGNOSIS — M5412 Radiculopathy, cervical region: Secondary | ICD-10-CM | POA: Diagnosis not present

## 2021-07-19 DIAGNOSIS — M48062 Spinal stenosis, lumbar region with neurogenic claudication: Secondary | ICD-10-CM | POA: Diagnosis not present

## 2021-07-19 DIAGNOSIS — M5136 Other intervertebral disc degeneration, lumbar region: Secondary | ICD-10-CM | POA: Diagnosis not present

## 2021-07-25 DIAGNOSIS — M5020 Other cervical disc displacement, unspecified cervical region: Secondary | ICD-10-CM | POA: Diagnosis not present

## 2021-07-25 DIAGNOSIS — M5412 Radiculopathy, cervical region: Secondary | ICD-10-CM | POA: Diagnosis not present

## 2021-07-31 DIAGNOSIS — C50911 Malignant neoplasm of unspecified site of right female breast: Secondary | ICD-10-CM | POA: Diagnosis not present

## 2021-07-31 DIAGNOSIS — L97411 Non-pressure chronic ulcer of right heel and midfoot limited to breakdown of skin: Secondary | ICD-10-CM | POA: Diagnosis not present

## 2021-07-31 DIAGNOSIS — Z23 Encounter for immunization: Secondary | ICD-10-CM | POA: Diagnosis not present

## 2021-07-31 DIAGNOSIS — I10 Essential (primary) hypertension: Secondary | ICD-10-CM | POA: Diagnosis not present

## 2021-07-31 DIAGNOSIS — Z853 Personal history of malignant neoplasm of breast: Secondary | ICD-10-CM | POA: Diagnosis not present

## 2021-07-31 DIAGNOSIS — M21611 Bunion of right foot: Secondary | ICD-10-CM | POA: Diagnosis not present

## 2021-07-31 DIAGNOSIS — E1165 Type 2 diabetes mellitus with hyperglycemia: Secondary | ICD-10-CM | POA: Diagnosis not present

## 2021-10-24 DIAGNOSIS — Z0000 Encounter for general adult medical examination without abnormal findings: Secondary | ICD-10-CM | POA: Diagnosis not present

## 2021-10-24 DIAGNOSIS — E1165 Type 2 diabetes mellitus with hyperglycemia: Secondary | ICD-10-CM | POA: Diagnosis not present

## 2021-10-30 DIAGNOSIS — M5412 Radiculopathy, cervical region: Secondary | ICD-10-CM | POA: Diagnosis not present

## 2021-10-30 DIAGNOSIS — M5416 Radiculopathy, lumbar region: Secondary | ICD-10-CM | POA: Diagnosis not present

## 2021-10-30 DIAGNOSIS — M5030 Other cervical disc degeneration, unspecified cervical region: Secondary | ICD-10-CM | POA: Diagnosis not present

## 2021-10-30 DIAGNOSIS — G8929 Other chronic pain: Secondary | ICD-10-CM | POA: Diagnosis not present

## 2021-10-30 DIAGNOSIS — M5136 Other intervertebral disc degeneration, lumbar region: Secondary | ICD-10-CM | POA: Diagnosis not present

## 2021-10-30 DIAGNOSIS — M4802 Spinal stenosis, cervical region: Secondary | ICD-10-CM | POA: Diagnosis not present

## 2021-10-30 DIAGNOSIS — M48062 Spinal stenosis, lumbar region with neurogenic claudication: Secondary | ICD-10-CM | POA: Diagnosis not present

## 2021-10-30 DIAGNOSIS — Z79899 Other long term (current) drug therapy: Secondary | ICD-10-CM | POA: Diagnosis not present

## 2021-10-30 DIAGNOSIS — M7542 Impingement syndrome of left shoulder: Secondary | ICD-10-CM | POA: Diagnosis not present

## 2021-10-30 DIAGNOSIS — M25562 Pain in left knee: Secondary | ICD-10-CM | POA: Diagnosis not present

## 2021-10-31 DIAGNOSIS — E782 Mixed hyperlipidemia: Secondary | ICD-10-CM | POA: Diagnosis not present

## 2021-10-31 DIAGNOSIS — D649 Anemia, unspecified: Secondary | ICD-10-CM | POA: Diagnosis not present

## 2021-10-31 DIAGNOSIS — Z9011 Acquired absence of right breast and nipple: Secondary | ICD-10-CM | POA: Diagnosis not present

## 2021-10-31 DIAGNOSIS — M1712 Unilateral primary osteoarthritis, left knee: Secondary | ICD-10-CM | POA: Diagnosis not present

## 2021-10-31 DIAGNOSIS — E039 Hypothyroidism, unspecified: Secondary | ICD-10-CM | POA: Diagnosis not present

## 2021-10-31 DIAGNOSIS — M79604 Pain in right leg: Secondary | ICD-10-CM | POA: Diagnosis not present

## 2021-10-31 DIAGNOSIS — Z0000 Encounter for general adult medical examination without abnormal findings: Secondary | ICD-10-CM | POA: Diagnosis not present

## 2021-10-31 DIAGNOSIS — I10 Essential (primary) hypertension: Secondary | ICD-10-CM | POA: Diagnosis not present

## 2021-10-31 DIAGNOSIS — Z853 Personal history of malignant neoplasm of breast: Secondary | ICD-10-CM | POA: Diagnosis not present

## 2021-10-31 DIAGNOSIS — E1165 Type 2 diabetes mellitus with hyperglycemia: Secondary | ICD-10-CM | POA: Diagnosis not present

## 2021-10-31 DIAGNOSIS — M79605 Pain in left leg: Secondary | ICD-10-CM | POA: Diagnosis not present

## 2021-10-31 DIAGNOSIS — R29898 Other symptoms and signs involving the musculoskeletal system: Secondary | ICD-10-CM | POA: Diagnosis not present

## 2021-11-21 DIAGNOSIS — M4802 Spinal stenosis, cervical region: Secondary | ICD-10-CM | POA: Diagnosis not present

## 2021-11-21 DIAGNOSIS — M5412 Radiculopathy, cervical region: Secondary | ICD-10-CM | POA: Diagnosis not present

## 2021-12-14 ENCOUNTER — Other Ambulatory Visit: Payer: Self-pay | Admitting: Surgery

## 2021-12-14 DIAGNOSIS — Z1231 Encounter for screening mammogram for malignant neoplasm of breast: Secondary | ICD-10-CM

## 2022-01-03 DIAGNOSIS — L02811 Cutaneous abscess of head [any part, except face]: Secondary | ICD-10-CM | POA: Diagnosis not present

## 2022-01-29 DIAGNOSIS — M5020 Other cervical disc displacement, unspecified cervical region: Secondary | ICD-10-CM | POA: Diagnosis not present

## 2022-01-29 DIAGNOSIS — M5412 Radiculopathy, cervical region: Secondary | ICD-10-CM | POA: Diagnosis not present

## 2022-01-29 DIAGNOSIS — M5416 Radiculopathy, lumbar region: Secondary | ICD-10-CM | POA: Diagnosis not present

## 2022-01-29 DIAGNOSIS — M5136 Other intervertebral disc degeneration, lumbar region: Secondary | ICD-10-CM | POA: Diagnosis not present

## 2022-01-29 DIAGNOSIS — M48062 Spinal stenosis, lumbar region with neurogenic claudication: Secondary | ICD-10-CM | POA: Diagnosis not present

## 2022-01-29 DIAGNOSIS — M4802 Spinal stenosis, cervical region: Secondary | ICD-10-CM | POA: Diagnosis not present

## 2022-01-29 DIAGNOSIS — Z79899 Other long term (current) drug therapy: Secondary | ICD-10-CM | POA: Diagnosis not present

## 2022-01-29 DIAGNOSIS — M5030 Other cervical disc degeneration, unspecified cervical region: Secondary | ICD-10-CM | POA: Diagnosis not present

## 2022-02-06 DIAGNOSIS — L03115 Cellulitis of right lower limb: Secondary | ICD-10-CM | POA: Diagnosis not present

## 2022-02-06 DIAGNOSIS — E119 Type 2 diabetes mellitus without complications: Secondary | ICD-10-CM | POA: Diagnosis not present

## 2022-02-06 DIAGNOSIS — L89610 Pressure ulcer of right heel, unstageable: Secondary | ICD-10-CM | POA: Diagnosis not present

## 2022-02-09 DIAGNOSIS — M48062 Spinal stenosis, lumbar region with neurogenic claudication: Secondary | ICD-10-CM | POA: Diagnosis not present

## 2022-02-09 DIAGNOSIS — M5416 Radiculopathy, lumbar region: Secondary | ICD-10-CM | POA: Diagnosis not present

## 2022-02-13 ENCOUNTER — Ambulatory Visit: Payer: PPO

## 2022-02-22 DIAGNOSIS — E119 Type 2 diabetes mellitus without complications: Secondary | ICD-10-CM | POA: Diagnosis not present

## 2022-02-22 DIAGNOSIS — M7989 Other specified soft tissue disorders: Secondary | ICD-10-CM | POA: Diagnosis not present

## 2022-02-22 DIAGNOSIS — L89610 Pressure ulcer of right heel, unstageable: Secondary | ICD-10-CM | POA: Diagnosis not present

## 2022-02-27 DIAGNOSIS — I10 Essential (primary) hypertension: Secondary | ICD-10-CM | POA: Diagnosis not present

## 2022-02-27 DIAGNOSIS — G8929 Other chronic pain: Secondary | ICD-10-CM | POA: Diagnosis not present

## 2022-02-27 DIAGNOSIS — E119 Type 2 diabetes mellitus without complications: Secondary | ICD-10-CM | POA: Diagnosis not present

## 2022-02-27 DIAGNOSIS — M25571 Pain in right ankle and joints of right foot: Secondary | ICD-10-CM | POA: Diagnosis not present

## 2022-02-27 DIAGNOSIS — L89610 Pressure ulcer of right heel, unstageable: Secondary | ICD-10-CM | POA: Diagnosis not present

## 2022-02-28 ENCOUNTER — Ambulatory Visit
Admission: RE | Admit: 2022-02-28 | Discharge: 2022-02-28 | Disposition: A | Discharge status: Home / Self Care | Payer: PPO | Source: Ambulatory Visit | Attending: Surgery | Admitting: Surgery

## 2022-02-28 DIAGNOSIS — Z1231 Encounter for screening mammogram for malignant neoplasm of breast: Secondary | ICD-10-CM | POA: Insufficient documentation

## 2022-03-06 DIAGNOSIS — L89610 Pressure ulcer of right heel, unstageable: Secondary | ICD-10-CM | POA: Diagnosis not present

## 2022-03-06 DIAGNOSIS — Z9011 Acquired absence of right breast and nipple: Secondary | ICD-10-CM | POA: Diagnosis not present

## 2022-03-06 DIAGNOSIS — I10 Essential (primary) hypertension: Secondary | ICD-10-CM | POA: Diagnosis not present

## 2022-03-06 DIAGNOSIS — E119 Type 2 diabetes mellitus without complications: Secondary | ICD-10-CM | POA: Diagnosis not present

## 2022-03-13 DIAGNOSIS — E119 Type 2 diabetes mellitus without complications: Secondary | ICD-10-CM | POA: Diagnosis not present

## 2022-03-13 DIAGNOSIS — L89610 Pressure ulcer of right heel, unstageable: Secondary | ICD-10-CM | POA: Diagnosis not present

## 2022-03-28 DIAGNOSIS — R29898 Other symptoms and signs involving the musculoskeletal system: Secondary | ICD-10-CM | POA: Diagnosis not present

## 2022-03-28 DIAGNOSIS — E039 Hypothyroidism, unspecified: Secondary | ICD-10-CM | POA: Diagnosis not present

## 2022-03-28 DIAGNOSIS — E782 Mixed hyperlipidemia: Secondary | ICD-10-CM | POA: Diagnosis not present

## 2022-03-28 DIAGNOSIS — Z853 Personal history of malignant neoplasm of breast: Secondary | ICD-10-CM | POA: Diagnosis not present

## 2022-03-28 DIAGNOSIS — M1712 Unilateral primary osteoarthritis, left knee: Secondary | ICD-10-CM | POA: Diagnosis not present

## 2022-03-28 DIAGNOSIS — Z9011 Acquired absence of right breast and nipple: Secondary | ICD-10-CM | POA: Diagnosis not present

## 2022-03-28 DIAGNOSIS — Z79899 Other long term (current) drug therapy: Secondary | ICD-10-CM | POA: Diagnosis not present

## 2022-03-28 DIAGNOSIS — I10 Essential (primary) hypertension: Secondary | ICD-10-CM | POA: Diagnosis not present

## 2022-03-28 DIAGNOSIS — E1165 Type 2 diabetes mellitus with hyperglycemia: Secondary | ICD-10-CM | POA: Diagnosis not present

## 2022-03-28 DIAGNOSIS — M79605 Pain in left leg: Secondary | ICD-10-CM | POA: Diagnosis not present

## 2022-03-28 DIAGNOSIS — M79604 Pain in right leg: Secondary | ICD-10-CM | POA: Diagnosis not present

## 2022-03-28 DIAGNOSIS — D649 Anemia, unspecified: Secondary | ICD-10-CM | POA: Diagnosis not present

## 2022-04-02 DIAGNOSIS — Z9011 Acquired absence of right breast and nipple: Secondary | ICD-10-CM | POA: Diagnosis not present

## 2022-04-02 DIAGNOSIS — I10 Essential (primary) hypertension: Secondary | ICD-10-CM | POA: Diagnosis not present

## 2022-04-02 DIAGNOSIS — N1831 Chronic kidney disease, stage 3a: Secondary | ICD-10-CM | POA: Diagnosis not present

## 2022-04-02 DIAGNOSIS — R262 Difficulty in walking, not elsewhere classified: Secondary | ICD-10-CM | POA: Diagnosis not present

## 2022-04-02 DIAGNOSIS — R634 Abnormal weight loss: Secondary | ICD-10-CM | POA: Diagnosis not present

## 2022-04-02 DIAGNOSIS — Z853 Personal history of malignant neoplasm of breast: Secondary | ICD-10-CM | POA: Diagnosis not present

## 2022-04-02 DIAGNOSIS — I83019 Varicose veins of right lower extremity with ulcer of unspecified site: Secondary | ICD-10-CM | POA: Diagnosis not present

## 2022-04-02 DIAGNOSIS — R5383 Other fatigue: Secondary | ICD-10-CM | POA: Diagnosis not present

## 2022-04-02 DIAGNOSIS — R5381 Other malaise: Secondary | ICD-10-CM | POA: Diagnosis not present

## 2022-04-02 DIAGNOSIS — Z0000 Encounter for general adult medical examination without abnormal findings: Secondary | ICD-10-CM | POA: Diagnosis not present

## 2022-04-02 DIAGNOSIS — C50911 Malignant neoplasm of unspecified site of right female breast: Secondary | ICD-10-CM | POA: Diagnosis not present

## 2022-04-02 DIAGNOSIS — E1165 Type 2 diabetes mellitus with hyperglycemia: Secondary | ICD-10-CM | POA: Diagnosis not present

## 2022-04-02 DIAGNOSIS — D649 Anemia, unspecified: Secondary | ICD-10-CM | POA: Diagnosis not present

## 2022-04-22 ENCOUNTER — Ambulatory Visit
Admission: EM | Admit: 2022-04-22 | Discharge: 2022-04-22 | Disposition: A | Discharge status: Home / Self Care | Payer: PPO | Attending: Family Medicine | Admitting: Family Medicine

## 2022-04-22 DIAGNOSIS — L89619 Pressure ulcer of right heel, unspecified stage: Secondary | ICD-10-CM | POA: Diagnosis not present

## 2022-04-22 NOTE — Discharge Instructions (Signed)
Keep the area clean.  Follow up with podiatry.  No evidence of infection at this time.

## 2022-04-22 NOTE — ED Provider Notes (Signed)
[facility]    CSN: [id number] Arrival date & time: 04/22/22  0841      History   Chief Complaint Chief Complaint  Patient presents with   Foot Injury    Right     HPI 81 year old female presents with a wound to her left heel.  This is an ongoing issue.  She has been followed by her primary care physician regarding this.  She states that it initially healed and is now opened up again.  He has a small wound to the posterior aspect of her right heel.  It is approximately 1.5 cm.  It is superficial.  Does not go down to the subcutaneous tissue.  No surrounding erythema.  No drainage.  No fever.  Past Medical History:  Diagnosis Date   Arthritis    Arthritis    Breast cancer ([place]) 1992   right breast with lumpectomy and rad tx   Cancer of right breast ([place]) 04/16/2013   right breast with mastectomy   Diabetes mellitus without complication ([place])    Gout    High cholesterol    Hyperlipidemia    Hypertension    Personal history of radiation therapy     Patient Active Problem List   Diagnosis Date Noted   Cervical spondylosis 03/09/2020   Lower limb ulcer, ankle, right, limited to breakdown of skin ([place]) 09/09/2018   Chronic gouty arthritis 04/29/2018   Generalized osteoarthritis of hand 02/12/2018   Hyperuricemia 02/12/2018   Swelling of finger 02/12/2018   PAD (peripheral artery disease) ([place]) 04/30/2017   Lymphedema 04/30/2017   Bilateral edema of lower extremity 04/03/2017   Ecchymosis 04/03/2017   Leg pain, bilateral 04/03/2017   Acquired hypothyroidism 01/24/2017   Benign essential hypertension 01/24/2017   History of mastectomy, right 01/24/2017   Primary osteoarthritis of left knee 02/29/2016   Cancer of right breast ([place]) 04/17/2015   Chronic pain 03/03/2015   Chronic pain of both knees 03/03/2015   Arthritis 01/11/2014   Soft tissue lesion of shoulder region 01/11/2014   Diabetes mellitus, type 2 ([place]) 01/11/2014   H/O renal calculi 01/11/2014    Hypertension 01/11/2014   HLD (hyperlipidemia) 01/11/2014   Neuropathy 01/11/2014   OP (osteoporosis) 01/11/2014   H/O malignant neoplasm of breast 01/11/2014    Past Surgical History:  Procedure Laterality Date   ABDOMINAL HYSTERECTOMY     ANKLE FRACTURE SURGERY Right 2004   BREAST LUMPECTOMY Right 1992   positive   MASTECTOMY Right 2014   SHOULDER SURGERY Right 2010    OB History   No obstetric history on file.      Home Medications    Prior to Admission medications   Medication Sig Start Date End Date Taking? Authorizing Provider  acetaminophen (TYLENOL) 500 MG tablet Take 500 mg by mouth QID.    [provider]  alendronate (FOSAMAX) 70 MG tablet Take 70 mg by mouth once a week.     [provider]  allopurinol (ZYLOPRIM) 300 MG tablet Take 300 mg by mouth daily. 08/08/18   [provider]  atenolol (TENORMIN) 50 MG tablet Take 50 mg by mouth daily.     [provider]  atorvastatin (LIPITOR) 40 MG tablet Take 40 mg by mouth daily.  08/09/15   [provider]  diclofenac Sodium (VOLTAREN) 1 % GEL Apply topically. 05/23/20   [provider]  FERREX 150 150 MG capsule Take 150 mg by mouth daily. 06/20/18   [provider]  furosemide (  LASIX) 40 MG tablet Take 40 mg by mouth daily.    [provider]  HYDROcodone-acetaminophen (NORCO) 7.5-325 MG tablet Take 1 tablet by mouth 2 (two) times daily as needed. 05/20/20   [provider]  levothyroxine (SYNTHROID, LEVOTHROID) 75 MCG tablet Take 75 mcg by mouth daily.  10/10/15   [provider]  lisinopril-hydrochlorothiazide (PRINZIDE,ZESTORETIC) 20-12.5 MG tablet Take 1 tablet by mouth daily.  06/01/14   [provider]  meloxicam (MOBIC) 7.5 MG tablet Take 7.5 mg by mouth daily. 05/09/20   [provider]  metFORMIN (GLUCOPHAGE) 1000 MG tablet Take 1,000 mg by mouth daily with breakfast.  09/29/15   [provider]   vitamin B-12 (CYANOCOBALAMIN) 1000 MCG tablet Take 1,000 mcg by mouth daily.    [provider]  Vitamin D, Ergocalciferol, (DRISDOL) 50000 UNITS CAPS capsule Take 50,000 Units by mouth every 30 (thirty) days.     [provider]    Family History Family History  Problem Relation Age of Onset   Breast cancer Neg Hx     Social History Social History   Tobacco Use   Smoking status: Never   Smokeless tobacco: Never  Vaping Use   Vaping Use: Never used  Substance Use Topics   Alcohol use: No    Alcohol/week: 0.0 standard drinks of alcohol   Drug use: No     Allergies   Other and Sulfa antibiotics   Review of Systems Review of Systems Per HPI  Physical Exam Triage Vital Signs ED Triage Vitals  Enc Vitals Group     BP 04/22/22 0904 (!) 159/77     Pulse Rate 04/22/22 0904 73     Resp 04/22/22 0904 20     Temp 04/22/22 0904 98.1 F (36.7 C)     Temp Source 04/22/22 0904 Oral     SpO2 04/22/22 0904 98 %     Weight 04/22/22 0902 122 lb (55.3 kg)     Height 04/22/22 0902 '4\' 9"'$  (1.448 m)     Head Circumference --      Peak Flow --      Pain Score 04/22/22 0902 5     Pain Loc --      Pain Edu? --      Excl. in [place]? --    Updated Vital Signs BP (!) 159/77 (BP Location: Left Arm)   Pulse 73   Temp 98.1 F (36.7 C) (Oral)   Resp 20   Ht '4\' 9"'$  (1.448 m)   Wt 55.3 kg   SpO2 98%   BMI 26.40 kg/m   Visual Acuity Right Eye Distance:   Left Eye Distance:   Bilateral Distance:    Right Eye Near:   Left Eye Near:    Bilateral Near:     Physical Exam Constitutional:      General: She is not in acute distress. HENT:     Head: Normocephalic and atraumatic.  Eyes:     General:        Right eye: No discharge.        Left eye: No discharge.     Conjunctiva/sclera: Conjunctivae normal.  Pulmonary:     Effort: Pulmonary effort is normal. No respiratory distress.  Musculoskeletal:     Comments: Small, approximately 1.5 cm circular superficial  wound noted on the posterior aspect of the right heel.  Neurological:     Mental Status: She is alert.  Psychiatric:        Mood  and Affect: Mood normal.        Behavior: Behavior normal.    UC Treatments / Results  Labs (all labs ordered are listed, but only abnormal results are displayed) Labs Reviewed - No data to display  EKG   Radiology No results found.  Procedures Procedures (including critical care time)  Medications Ordered in UC Medications - No data to display  Initial Impression / Assessment and Plan / UC Course  I have reviewed the triage vital signs and the nursing notes.  Pertinent labs & imaging results that were available during my care of the patient were reviewed by me and considered in my medical decision making (see chart for details).    81 year old female presents with a pressure injury to the right heel.  This is a recurrent issue.  Advised close follow-up with podiatry.  Wound was dressed today.  There is no evidence of infection.  Final Clinical Impressions(s) / UC Diagnoses   Final diagnoses:  Pressure injury of skin of right heel, unspecified injury stage     Discharge Instructions      Keep the area clean.  Follow up with podiatry.  No evidence of infection at this time.     ED Prescriptions   None    PDMP not reviewed this encounter.   Coral Spikes, DO 04/22/22 1021

## 2022-04-22 NOTE — ED Triage Notes (Signed)
Patient presents to UC for a sore on her right heel.  Sore has been there for about a year off and on.  Patient has been seeing PCP for this but has not seen him in the last 2 months -- in which it has gotten worse.

## 2022-05-07 DIAGNOSIS — M79674 Pain in right toe(s): Secondary | ICD-10-CM | POA: Diagnosis not present

## 2022-05-07 DIAGNOSIS — B351 Tinea unguium: Secondary | ICD-10-CM | POA: Diagnosis not present

## 2022-05-07 DIAGNOSIS — I739 Peripheral vascular disease, unspecified: Secondary | ICD-10-CM | POA: Diagnosis not present

## 2022-05-07 DIAGNOSIS — M5416 Radiculopathy, lumbar region: Secondary | ICD-10-CM | POA: Diagnosis not present

## 2022-05-07 DIAGNOSIS — I872 Venous insufficiency (chronic) (peripheral): Secondary | ICD-10-CM | POA: Diagnosis not present

## 2022-05-07 DIAGNOSIS — M48062 Spinal stenosis, lumbar region with neurogenic claudication: Secondary | ICD-10-CM | POA: Diagnosis not present

## 2022-05-07 DIAGNOSIS — L97411 Non-pressure chronic ulcer of right heel and midfoot limited to breakdown of skin: Secondary | ICD-10-CM | POA: Diagnosis not present

## 2022-05-07 DIAGNOSIS — I890 Lymphedema, not elsewhere classified: Secondary | ICD-10-CM | POA: Diagnosis not present

## 2022-05-07 DIAGNOSIS — M79675 Pain in left toe(s): Secondary | ICD-10-CM | POA: Diagnosis not present

## 2022-05-14 DIAGNOSIS — I872 Venous insufficiency (chronic) (peripheral): Secondary | ICD-10-CM | POA: Diagnosis not present

## 2022-05-14 DIAGNOSIS — I890 Lymphedema, not elsewhere classified: Secondary | ICD-10-CM | POA: Diagnosis not present

## 2022-05-14 DIAGNOSIS — I83019 Varicose veins of right lower extremity with ulcer of unspecified site: Secondary | ICD-10-CM | POA: Diagnosis not present

## 2022-05-14 DIAGNOSIS — L97919 Non-pressure chronic ulcer of unspecified part of right lower leg with unspecified severity: Secondary | ICD-10-CM | POA: Diagnosis not present

## 2022-05-14 DIAGNOSIS — I739 Peripheral vascular disease, unspecified: Secondary | ICD-10-CM | POA: Diagnosis not present

## 2022-05-14 DIAGNOSIS — I83029 Varicose veins of left lower extremity with ulcer of unspecified site: Secondary | ICD-10-CM | POA: Diagnosis not present

## 2022-05-14 DIAGNOSIS — L97929 Non-pressure chronic ulcer of unspecified part of left lower leg with unspecified severity: Secondary | ICD-10-CM | POA: Diagnosis not present

## 2022-05-21 DIAGNOSIS — L97919 Non-pressure chronic ulcer of unspecified part of right lower leg with unspecified severity: Secondary | ICD-10-CM | POA: Diagnosis not present

## 2022-05-21 DIAGNOSIS — L97929 Non-pressure chronic ulcer of unspecified part of left lower leg with unspecified severity: Secondary | ICD-10-CM | POA: Diagnosis not present

## 2022-05-21 DIAGNOSIS — I83029 Varicose veins of left lower extremity with ulcer of unspecified site: Secondary | ICD-10-CM | POA: Diagnosis not present

## 2022-05-21 DIAGNOSIS — I739 Peripheral vascular disease, unspecified: Secondary | ICD-10-CM | POA: Diagnosis not present

## 2022-05-21 DIAGNOSIS — I83019 Varicose veins of right lower extremity with ulcer of unspecified site: Secondary | ICD-10-CM | POA: Diagnosis not present

## 2022-05-31 DIAGNOSIS — Z1211 Encounter for screening for malignant neoplasm of colon: Secondary | ICD-10-CM | POA: Diagnosis not present

## 2022-05-31 DIAGNOSIS — D649 Anemia, unspecified: Secondary | ICD-10-CM | POA: Diagnosis not present

## 2022-06-04 DIAGNOSIS — I83019 Varicose veins of right lower extremity with ulcer of unspecified site: Secondary | ICD-10-CM | POA: Diagnosis not present

## 2022-06-04 DIAGNOSIS — L97929 Non-pressure chronic ulcer of unspecified part of left lower leg with unspecified severity: Secondary | ICD-10-CM | POA: Diagnosis not present

## 2022-06-04 DIAGNOSIS — L03116 Cellulitis of left lower limb: Secondary | ICD-10-CM | POA: Diagnosis not present

## 2022-06-04 DIAGNOSIS — L97919 Non-pressure chronic ulcer of unspecified part of right lower leg with unspecified severity: Secondary | ICD-10-CM | POA: Diagnosis not present

## 2022-06-04 DIAGNOSIS — I83029 Varicose veins of left lower extremity with ulcer of unspecified site: Secondary | ICD-10-CM | POA: Diagnosis not present

## 2022-06-04 DIAGNOSIS — L03115 Cellulitis of right lower limb: Secondary | ICD-10-CM | POA: Diagnosis not present

## 2022-06-04 DIAGNOSIS — I872 Venous insufficiency (chronic) (peripheral): Secondary | ICD-10-CM | POA: Diagnosis not present

## 2022-06-11 DIAGNOSIS — L97929 Non-pressure chronic ulcer of unspecified part of left lower leg with unspecified severity: Secondary | ICD-10-CM | POA: Diagnosis not present

## 2022-06-11 DIAGNOSIS — I83019 Varicose veins of right lower extremity with ulcer of unspecified site: Secondary | ICD-10-CM | POA: Diagnosis not present

## 2022-06-11 DIAGNOSIS — L97919 Non-pressure chronic ulcer of unspecified part of right lower leg with unspecified severity: Secondary | ICD-10-CM | POA: Diagnosis not present

## 2022-06-11 DIAGNOSIS — I83029 Varicose veins of left lower extremity with ulcer of unspecified site: Secondary | ICD-10-CM | POA: Diagnosis not present

## 2022-06-18 DIAGNOSIS — L97929 Non-pressure chronic ulcer of unspecified part of left lower leg with unspecified severity: Secondary | ICD-10-CM | POA: Diagnosis not present

## 2022-06-18 DIAGNOSIS — I83029 Varicose veins of left lower extremity with ulcer of unspecified site: Secondary | ICD-10-CM | POA: Diagnosis not present

## 2022-06-18 DIAGNOSIS — L97919 Non-pressure chronic ulcer of unspecified part of right lower leg with unspecified severity: Secondary | ICD-10-CM | POA: Diagnosis not present

## 2022-06-18 DIAGNOSIS — I872 Venous insufficiency (chronic) (peripheral): Secondary | ICD-10-CM | POA: Diagnosis not present

## 2022-06-18 DIAGNOSIS — I83019 Varicose veins of right lower extremity with ulcer of unspecified site: Secondary | ICD-10-CM | POA: Diagnosis not present

## 2022-06-19 DIAGNOSIS — M5416 Radiculopathy, lumbar region: Secondary | ICD-10-CM | POA: Diagnosis not present

## 2022-06-19 DIAGNOSIS — M48062 Spinal stenosis, lumbar region with neurogenic claudication: Secondary | ICD-10-CM | POA: Diagnosis not present

## 2022-06-25 DIAGNOSIS — L97919 Non-pressure chronic ulcer of unspecified part of right lower leg with unspecified severity: Secondary | ICD-10-CM | POA: Diagnosis not present

## 2022-06-25 DIAGNOSIS — I83029 Varicose veins of left lower extremity with ulcer of unspecified site: Secondary | ICD-10-CM | POA: Diagnosis not present

## 2022-06-25 DIAGNOSIS — I739 Peripheral vascular disease, unspecified: Secondary | ICD-10-CM | POA: Diagnosis not present

## 2022-06-25 DIAGNOSIS — I872 Venous insufficiency (chronic) (peripheral): Secondary | ICD-10-CM | POA: Diagnosis not present

## 2022-06-25 DIAGNOSIS — I83019 Varicose veins of right lower extremity with ulcer of unspecified site: Secondary | ICD-10-CM | POA: Diagnosis not present

## 2022-06-25 DIAGNOSIS — L97929 Non-pressure chronic ulcer of unspecified part of left lower leg with unspecified severity: Secondary | ICD-10-CM | POA: Diagnosis not present

## 2022-07-02 DIAGNOSIS — L97929 Non-pressure chronic ulcer of unspecified part of left lower leg with unspecified severity: Secondary | ICD-10-CM | POA: Diagnosis not present

## 2022-07-02 DIAGNOSIS — I890 Lymphedema, not elsewhere classified: Secondary | ICD-10-CM | POA: Diagnosis not present

## 2022-07-02 DIAGNOSIS — I83029 Varicose veins of left lower extremity with ulcer of unspecified site: Secondary | ICD-10-CM | POA: Diagnosis not present

## 2022-07-02 DIAGNOSIS — I872 Venous insufficiency (chronic) (peripheral): Secondary | ICD-10-CM | POA: Diagnosis not present

## 2022-07-02 DIAGNOSIS — I83019 Varicose veins of right lower extremity with ulcer of unspecified site: Secondary | ICD-10-CM | POA: Diagnosis not present

## 2022-07-02 DIAGNOSIS — L97919 Non-pressure chronic ulcer of unspecified part of right lower leg with unspecified severity: Secondary | ICD-10-CM | POA: Diagnosis not present

## 2022-07-17 DIAGNOSIS — I83019 Varicose veins of right lower extremity with ulcer of unspecified site: Secondary | ICD-10-CM | POA: Diagnosis not present

## 2022-07-17 DIAGNOSIS — L97929 Non-pressure chronic ulcer of unspecified part of left lower leg with unspecified severity: Secondary | ICD-10-CM | POA: Diagnosis not present

## 2022-07-17 DIAGNOSIS — L97919 Non-pressure chronic ulcer of unspecified part of right lower leg with unspecified severity: Secondary | ICD-10-CM | POA: Diagnosis not present

## 2022-07-17 DIAGNOSIS — B351 Tinea unguium: Secondary | ICD-10-CM | POA: Diagnosis not present

## 2022-07-17 DIAGNOSIS — I83029 Varicose veins of left lower extremity with ulcer of unspecified site: Secondary | ICD-10-CM | POA: Diagnosis not present

## 2022-07-17 DIAGNOSIS — M79674 Pain in right toe(s): Secondary | ICD-10-CM | POA: Diagnosis not present

## 2022-07-17 DIAGNOSIS — M79675 Pain in left toe(s): Secondary | ICD-10-CM | POA: Diagnosis not present

## 2022-07-24 ENCOUNTER — Encounter: Payer: PPO | Attending: Physician Assistant | Admitting: Physician Assistant

## 2022-07-24 DIAGNOSIS — T364X5A Adverse effect of tetracyclines, initial encounter: Secondary | ICD-10-CM | POA: Insufficient documentation

## 2022-07-24 DIAGNOSIS — L03116 Cellulitis of left lower limb: Secondary | ICD-10-CM | POA: Insufficient documentation

## 2022-07-24 DIAGNOSIS — L97822 Non-pressure chronic ulcer of other part of left lower leg with fat layer exposed: Secondary | ICD-10-CM | POA: Diagnosis not present

## 2022-07-24 DIAGNOSIS — K521 Toxic gastroenteritis and colitis: Secondary | ICD-10-CM | POA: Diagnosis not present

## 2022-07-24 DIAGNOSIS — I10 Essential (primary) hypertension: Secondary | ICD-10-CM | POA: Diagnosis not present

## 2022-07-24 DIAGNOSIS — I87333 Chronic venous hypertension (idiopathic) with ulcer and inflammation of bilateral lower extremity: Secondary | ICD-10-CM | POA: Insufficient documentation

## 2022-07-24 DIAGNOSIS — I890 Lymphedema, not elsewhere classified: Secondary | ICD-10-CM | POA: Insufficient documentation

## 2022-07-24 DIAGNOSIS — E11622 Type 2 diabetes mellitus with other skin ulcer: Secondary | ICD-10-CM | POA: Insufficient documentation

## 2022-07-24 DIAGNOSIS — L97812 Non-pressure chronic ulcer of other part of right lower leg with fat layer exposed: Secondary | ICD-10-CM | POA: Diagnosis not present

## 2022-07-24 DIAGNOSIS — L97222 Non-pressure chronic ulcer of left calf with fat layer exposed: Secondary | ICD-10-CM | POA: Diagnosis not present

## 2022-07-24 DIAGNOSIS — Z79899 Other long term (current) drug therapy: Secondary | ICD-10-CM | POA: Insufficient documentation

## 2022-07-24 DIAGNOSIS — E1143 Type 2 diabetes mellitus with diabetic autonomic (poly)neuropathy: Secondary | ICD-10-CM | POA: Insufficient documentation

## 2022-07-24 DIAGNOSIS — E1151 Type 2 diabetes mellitus with diabetic peripheral angiopathy without gangrene: Secondary | ICD-10-CM | POA: Diagnosis not present

## 2022-07-24 NOTE — Progress Notes (Signed)
JILLIANE, KAZANJIAN ([id number]) Visit Report for 07/24/2022 Chief Complaint Document Details Patient Name: [name], [name]. Date of Service: 07/24/2022 1:00 PM Medical Record Number: [id number] Patient Account Number: [id number] Date of Birth/Sex: Mar 25, 1941 (81 y.o. F) Treating RN: Cornell Barman Primary Care Provider: Tracie Harrier Other Clinician: Referring Provider: Samara Deist Treating Provider/Extender: Skipper Cliche in Treatment: 0 Information Obtained from: Patient Chief Complaint Bilateral LE Ulcers Electronic Signature(s) Signed: 07/24/2022 1:41:48 PM By: Worthy Keeler PA-C Entered By: Worthy Keeler on 07/24/2022 13:41:48 Scalia, [name] ([id number]) -------------------------------------------------------------------------------- Debridement Details Patient Name: [name]. Date of Service: 07/24/2022 1:00 PM Medical Record Number: [id number] Patient Account Number: [id number] Date of Birth/Sex: 13-Jan-1941 (81 y.o. F) Treating RN: Cornell Barman Primary Care Provider: Tracie Harrier Other Clinician: Referring Provider: Samara Deist Treating Provider/Extender: Skipper Cliche in Treatment: 0 Debridement Performed for Wound #3 Right,Lateral Lower Leg Assessment: Performed By: Physician Tommie Sams., PA-C Debridement Type: Chemical/Enzymatic/Mechanical Agent Used: saline gauze Severity of Tissue Pre Debridement: Fat layer exposed Level of Consciousness (Pre- Awake and Alert procedure): Pre-procedure Verification/Time Out Yes - 13:53 Taken: Instrument: Other : saline and gauze Bleeding: None Response to Treatment: Procedure was tolerated well Level of Consciousness (Post- Awake and Alert procedure): Post Debridement Measurements of Total Wound Length: (cm) 1.2 Width: (cm) 1.6 Depth: (cm) 0.2 Volume: (cm) 0.302 Character of Wound/Ulcer Post Debridement: Stable Severity of Tissue Post Debridement: Fat layer exposed Post  Procedure Diagnosis Same as Pre-procedure Electronic Signature(s) Signed: 07/24/2022 5:16:25 PM By: Worthy Keeler PA-C Signed: 07/24/2022 5:46:57 PM By: Gretta Cool, BSN, RN, CWS, Kim RN, BSN Entered By: Gretta Cool, BSN, RN, CWS, Kim on 07/24/2022 13:54:48 Kwasnik, [name] ([id number]) -------------------------------------------------------------------------------- Debridement Details Patient Name: [name] Ramus L. Date of Service: 07/24/2022 1:00 PM Medical Record Number: [id number] Patient Account Number: [id number] Date of Birth/Sex: February 12, 1941 (81 y.o. F) Treating RN: Cornell Barman Primary Care Provider: Tracie Harrier Other Clinician: Referring Provider: Samara Deist Treating Provider/Extender: Skipper Cliche in Treatment: 0 Debridement Performed for Wound #4 Left,Midline,Anterior Lower Leg Assessment: Performed By: Physician Tommie Sams., PA-C Debridement Type: Chemical/Enzymatic/Mechanical Agent Used: saline gauze Severity of Tissue Pre Debridement: Fat layer exposed Level of Consciousness (Pre- Awake and Alert procedure): Pre-procedure Verification/Time Out Yes - 13:53 Taken: Instrument: Other : saline and gauze Bleeding: None Response to Treatment: Procedure was tolerated well Level of Consciousness (Post- Awake and Alert procedure): Post Debridement Measurements of Total Wound Length: (cm) 9 Width: (cm) 7 Depth: (cm) 0.2 Volume: (cm) 9.896 Character of Wound/Ulcer Post Debridement: Stable Severity of Tissue Post Debridement: Fat layer exposed Post Procedure Diagnosis Same as Pre-procedure Electronic Signature(s) Signed: 07/24/2022 5:16:25 PM By: Worthy Keeler PA-C Signed: 07/24/2022 5:46:57 PM By: Gretta Cool, BSN, RN, CWS, Kim RN, BSN Entered By: Gretta Cool, BSN, RN, CWS, Kim on 07/24/2022 13:55:13 Heiland, [name] ([id number]) -------------------------------------------------------------------------------- Debridement Details Patient Name: [name] Ramus  L. Date of Service: 07/24/2022 1:00 PM Medical Record Number: [id number] Patient Account Number: [id number] Date of Birth/Sex: Aug 12, 1941 (81 y.o. F) Treating RN: Cornell Barman Primary Care Provider: Tracie Harrier Other Clinician: Referring Provider: Samara Deist Treating Provider/Extender: Skipper Cliche in Treatment: 0 Debridement Performed for Wound #5 Left,Posterior Lower Leg Assessment: Performed By: Physician Tommie Sams., PA-C Debridement Type: Chemical/Enzymatic/Mechanical Agent Used: saline gauze Severity of Tissue Pre Debridement: Fat layer exposed Level of Consciousness (Pre- Awake and Alert procedure): Pre-procedure Verification/Time Out Yes - 13:53 Taken: Instrument: Other : saline and gauze Bleeding: None Response to Treatment: Procedure was tolerated well  Level of Consciousness (Post- Awake and Alert procedure): Post Debridement Measurements of Total Wound Length: (cm) 0.9 Width: (cm) 4 Depth: (cm) 0.1 Volume: (cm) 0.283 Character of Wound/Ulcer Post Debridement: Stable Severity of Tissue Post Debridement: Fat layer exposed Post Procedure Diagnosis Same as Pre-procedure Electronic Signature(s) Signed: 07/24/2022 5:16:25 PM By: Worthy Keeler PA-C Signed: 07/24/2022 5:46:57 PM By: Gretta Cool, BSN, RN, CWS, Kim RN, BSN Entered By: Gretta Cool, BSN, RN, CWS, Kim on 07/24/2022 13:55:38 Magan, [name] ([id number]) -------------------------------------------------------------------------------- HPI Details Patient Name: [name] Ramus L. Date of Service: 07/24/2022 1:00 PM Medical Record Number: [id number] Patient Account Number: [id number] Date of Birth/Sex: [birth year] (81 y.o. F) Treating RN: Cornell Barman Primary Care Provider: Tracie Harrier Other Clinician: Referring Provider: Samara Deist Treating Provider/Extender: Skipper Cliche in Treatment: 0 History of Present Illness Location: swelling both lower extremities and ulceration on the right  lateral ankle Quality: Patient reports experiencing a dull pain to affected area(s). Severity: Patient states wound (s) are getting better. Duration: Patient has had the wound for < 4 weeks prior to presenting for treatment Timing: Pain in wound is Intermittent (comes and goes Context: The wound appeared gradually over time Modifying Factors: Other treatment(s) tried include:she received amoxicillin and doxycycline and also has been putting some local bandage. Associated Signs and Symptoms: Patient reports having increase swelling. HPI Description: 81 year old patient who comes with a referral for bilateral lower extremity edema and a lower extremity ulceration and has been sent by her PCP Dr. Placido Sou. I understand the patient was recently put on amoxicillin and doxycycline but could not tolerate the amoxicillin. doxycycline course was completed. a BNP and EKG was supposed to be normal and the patient did not have any dyspnea. the patient has been on a diuretic. The patient was also prescribed a pair of elastic compression stockings of the 20-30 mmHg pressure variety. x-ray of the right ankle was done on 09/20/2015 and showed posttraumatic and postsurgical changes of the right ankle with secondary degenerative changes of the tibiotalar joint and to a lesser degree the subtalar joint. No definite acute bony abnormalities are noted. Past medical history significant for diabetes mellitus, hypertension, hyperlipidemia, right breast cancer treated with a mastectomy in 2014. She has never smoked. 10/24/2015 -- she had delayed her vascular test because of her husband surgery but she is now ready to get him taken care of. He is also unable to use compression stockings and hence we will need to order her Juzo wraps. 10/31/2015-- was seen by Dr. Lucky Cowboy on 10/28/2015. She had a left lower extremity arterial duplex done at his office a couple of years ago and that was essentially normal. Today they performed  a venous duplex which revealed no evidence of deep vein thrombosis, superficial thrombophlebitis, no venous reflex was seen on the right and a minimal amount of reflux was seen on the left great saphenous vein but no significant reflux was seen. Impression was that there was a component of lymphedema present from a previous surgery and he would recommend compression stockings and leg elevation. Readmission: 07-24-2022 upon evaluation today patient presents for initial inspection here in our clinic concerning issues that she has been having with her legs this is actually been going on for several years according to what her family member with her today tells me as well as what the patient reiterates as well. She is currently most recently been seeing Dr. Vickki Muff and subsequently he had her in [place] boots. However 2 weeks ago he referred her  to [place] and then subsequently took her out of the [employer] boot wraps at that point. At this time the left leg looks to be worse in the right leg currently. She is on Lasix and lisinopril with hydrochlorothiazide she has high blood pressure she also has issues currently with lower extremity swelling and edema which has been an ongoing issue for her as well. Patient does have a history of chronic venous hypertension, lymphedema, diabetes mellitus type 2, hypertension, peripheral vascular disease, and neuropathy. Currently she is on Lasix as well as lisinopril with hydrochlorothiazide. Electronic Signature(s) Signed: 07/24/2022 3:53:37 PM By: Worthy Keeler PA-C Entered By: Worthy Keeler on 07/24/2022 15:53:36 [name], [name] ([id number]) -------------------------------------------------------------------------------- Physical Exam Details Patient Name: [name] Ramus L. Date of Service: 07/24/2022 1:00 PM Medical Record Number: [id number] Patient Account Number: [id number] Date of Birth/Sex: [birth year] (81 y.o. F) Treating RN: Cornell Barman Primary Care Provider:  Tracie Harrier Other Clinician: Referring Provider: Samara Deist Treating Provider/Extender: Jeri Cos Weeks in Treatment: 0 Constitutional sitting or standing blood pressure is within target range for patient.. pulse regular and within target range for patient.Marland Kitchen respirations regular, non- labored and within target range for patient.Marland Kitchen temperature within target range for patient.. Well-nourished and well-hydrated in no acute distress. Eyes conjunctiva clear no eyelid edema noted. pupils equal round and reactive to light and accommodation. Ears, Nose, Mouth, and Throat no gross abnormality of ear auricles or external auditory canals. normal hearing noted during conversation. mucus membranes moist. Respiratory normal breathing without difficulty. Cardiovascular 1+ dorsalis pedis/posterior tibialis pulses. 2+ pitting edema of the bilateral lower extremities. Musculoskeletal normal gait and posture. no significant deformity or arthritic changes, no loss or range of motion, no clubbing. Psychiatric this patient is able to make decisions and demonstrates good insight into disease process. Alert and Oriented x 3. pleasant and cooperative. Notes Upon inspection patient's wound bed actually showed signs at all locations of not being too bad nor to deep although the worst is the left leg for sure it also appears to be cellulitic based on what I am seeing. Fortunately I do not think there is any evidence of systemic infection noted based on what we are seeing at this point which is good news. Electronic Signature(s) Signed: 07/24/2022 5:10:33 PM By: Worthy Keeler PA-C Entered By: Worthy Keeler on 07/24/2022 17:10:33 Magn[place], [name] ([id number]) -------------------------------------------------------------------------------- Physician Orders Details Patient Name: [name] Date of Service: 07/24/2022 1:00 PM Medical Record Number: [id number] Patient Account Number:  [id number] Date of Birth/Sex: [birth year] (81 y.o. F) Treating RN: Cornell Barman Primary Care Provider: Tracie Harrier Other Clinician: Referring Provider: Samara Deist Treating Provider/Extender: Skipper Cliche in Treatment: 0 Verbal / Phone Orders: No Diagnosis Coding ICD-10 Coding Code Description [phone] Chronic venous hypertension (idiopathic) with ulcer and inflammation of bilateral lower extremity I89.0 Lymphedema, not elsewhere classified L97.822 Non-pressure chronic ulcer of other part of left lower leg with fat layer exposed L97.812 Non-pressure chronic ulcer of other part of right lower leg with fat layer exposed E11.622 Type 2 diabetes mellitus with other skin ulcer I10 Essential (primary) hypertension I73.89 Other specified peripheral vascular diseases E11.43 Type 2 diabetes mellitus with diabetic autonomic (poly)neuropathy Follow-up Appointments Wound #3 Right,Lateral Lower Leg o Return Appointment in 1 week. o Nurse Visit as needed Wound #4 Left,Midline,Anterior Lower Leg o Return Appointment in 1 week. o Nurse Visit as needed Wound #5 Left,Posterior Lower Leg o Return Appointment in 1 week. o Nurse Visit as needed Licensed conveyancer   o May shower; gently cleanse wound with antibacterial soap, rinse and pat dry prior to dressing wounds Anesthetic (Use 'Patient Medications' Section for Anesthetic Order Entry) o Lidocaine applied to wound bed Edema Control - Lymphedema / Segmental Compressive Device / Other o Tubigrip single layer applied. - Size: D Medications-Please add to medication list. o P.O. Antibiotics Wound Treatment Wound #3 - Lower Leg Wound Laterality: Right, Lateral Cleanser: Byram Ancillary Kit - 15 Day Supply 3 x Per Week/30 Days Discharge Instructions: Use supplies as instructed; Kit contains: (15) Saline Bullets; (15) 3x3 Gauze; 15 pr Gloves Primary Dressing: Silvercel Small 2x2 (in/in) 3 x Per Week/30 Days Discharge  Instructions: Apply Silvercel Small 2x2 (in/in) as instructed Secondary Dressing: ABD Pad 5x9 (in/in) 3 x Per Week/30 Days Discharge Instructions: Cover with ABD pad Wound #4 - Lower Leg Wound Laterality: Left, Midline, Anterior Cleanser: Byram Ancillary Kit - 15 Day Supply 3 x Per Week/30 Days Discharge Instructions: Use supplies as instructed; Kit contains: (15) Saline Bullets; (15) 3x3 Gauze; 15 pr Gloves Primary Dressing: Silvercel 4 1/4x 4 1/4 (in/in) (DME) (Generic) 3 x Per Week/30 Days Alatorre, Oreoluwa L. ([id number]) Discharge Instructions: Apply Silvercel 4 1/4x 4 1/4 (in/in) as instructed Secondary Dressing: ABD Pad 5x9 (in/in) 3 x Per Week/30 Days Discharge Instructions: Cover with ABD pad Wound #5 - Lower Leg Wound Laterality: Left, Posterior Cleanser: Byram Ancillary Kit - 15 Day Supply 3 x Per Week/30 Days Discharge Instructions: Use supplies as instructed; Kit contains: (15) Saline Bullets; (15) 3x3 Gauze; 15 pr Gloves Primary Dressing: Silvercel Small 2x2 (in/in) 3 x Per Week/30 Days Discharge Instructions: Apply Silvercel Small 2x2 (in/in) as instructed Secondary Dressing: ABD Pad 5x9 (in/in) 3 x Per Week/30 Days Discharge Instructions: Cover with ABD pad Laboratory o Bacteria identified in Wound by Culture (MICRO) - Left lower leg oooo LOINC Code: 2774-1 oooo Convenience Name: Wound culture routine Services and Therapies o Arterial Studies- Bilateral - ABIs/TBIs Patient Medications Allergies: Sulfa (Sulfonamide Antibiotics) Notifications Medication Indication Start End doxycycline hyclate 07/24/2022 DOSE 1 - oral 100 mg capsule - 1 capsule oral taken 2 times per day for 14 days. Do not take iron with this medication Electronic Signature(s) Signed: 07/24/2022 5:16:25 PM By: Worthy Keeler PA-C Signed: 07/24/2022 5:46:57 PM By: Gretta Cool BSN, RN, CWS, Kim RN, BSN Previous Signature: 07/24/2022 2:00:02 PM Version By: Worthy Keeler PA-C Entered By: Gretta Cool BSN, RN, CWS,  Kim on 07/24/2022 14:11:42 Najera, [name] ([id number]) -------------------------------------------------------------------------------- Prescription 07/24/2022 Patient Name: [name] Ramus L. Provider: Jeri Cos PA-C Date of Birth: [birth year] NPI#: [id number] Sex: F DEA#: [id number] Phone #: [phone] License #: Patient Address: [address] [place], [place] [address] [address] [place],  [address] [phone] Allergies Sulfa (Sulfonamide Antibiotics) Provider's Orders o Arterial Studies- Bilateral - ABIs/TBIs Hand Signature: Date(s): Electronic Signature(s) Signed: 07/24/2022 5:16:25 PM By: Worthy Keeler PA-C Signed: 07/24/2022 5:46:57 PM By: Gretta Cool, BSN, RN, CWS, Kim RN, BSN Entered By: Gretta Cool, BSN, RN, CWS, Kim on 07/24/2022 14:11:43 Mahurin, [name] ([id number]) --------------------------------------------------------------------------------  Problem List Details Patient Name: [name], [name]. Date of Service: 07/24/2022 1:00 PM Medical Record Number: [id number] Patient Account Number: [id number] Date of Birth/Sex: 1941/04/28 (81 y.o. F) Treating RN: Cornell Barman Primary Care Provider: Tracie Harrier Other Clinician: Referring Provider: Samara Deist Treating Provider/Extender: Skipper Cliche in Treatment: 0 Active Problems ICD-10 Encounter Code Description Active Date MDM Diagnosis I87.333 Chronic venous hypertension (idiopathic) with  ulcer and inflammation of 07/24/2022 No Yes bilateral lower extremity I89.0 Lymphedema, not elsewhere classified 07/24/2022 No Yes L97.822 Non-pressure chronic ulcer of other part of left lower leg with fat layer 07/24/2022 No Yes exposed L97.812 Non-pressure chronic ulcer of other part of right lower leg with fat layer 07/24/2022 No Yes exposed E11.622 Type 2 diabetes mellitus with other skin ulcer 07/24/2022  No Yes I10 Essential (primary) hypertension 07/24/2022 No Yes I73.89 Other specified peripheral vascular diseases 07/24/2022 No Yes E11.43 Type 2 diabetes mellitus with diabetic autonomic (poly)neuropathy 07/24/2022 No Yes Inactive Problems Resolved Problems Electronic Signature(s) Signed: 07/24/2022 1:41:32 PM By: Worthy Keeler PA-C Entered By: Worthy Keeler on 07/24/2022 13:41:31 Corrales, [name] ([id number]) -------------------------------------------------------------------------------- Progress Note Details Patient Name: [name] Ramus L. Date of Service: 07/24/2022 1:00 PM Medical Record Number: [id number] Patient Account Number: [id number] Date of Birth/Sex: [birth year] (81 y.o. F) Treating RN: Cornell Barman Primary Care Provider: Tracie Harrier Other Clinician: Referring Provider: Samara Deist Treating Provider/Extender: Skipper Cliche in Treatment: 0 Subjective Chief Complaint Information obtained from Patient Bilateral LE Ulcers History of Present Illness (HPI) The following HPI elements were documented for the patient's wound: Location: swelling both lower extremities and ulceration on the right lateral ankle Quality: Patient reports experiencing a dull pain to affected area(s). Severity: Patient states wound (s) are getting better. Duration: Patient has had the wound for < 4 weeks prior to presenting for treatment Timing: Pain in wound is Intermittent (comes and goes Context: The wound appeared gradually over time Modifying Factors: Other treatment(s) tried include:she received amoxicillin and doxycycline and also has been putting some local bandage. Associated Signs and Symptoms: Patient reports having increase swelling. 81 year old patient who comes with a referral for bilateral lower extremity edema and a lower extremity ulceration and has been sent by her PCP Dr. Placido Sou. I understand the patient was recently put on amoxicillin and doxycycline but could  not tolerate the amoxicillin. doxycycline course was completed. a BNP and EKG was supposed to be normal and the patient did not have any dyspnea. the patient has been on a diuretic. The patient was also prescribed a pair of elastic compression stockings of the 20-30 mmHg pressure variety. x-ray of the right ankle was done on 09/20/2015 and showed posttraumatic and postsurgical changes of the right ankle with secondary degenerative changes of the tibiotalar joint and to a lesser degree the subtalar joint. No definite acute bony abnormalities are noted. Past medical history significant for diabetes mellitus, hypertension, hyperlipidemia, right breast cancer treated with a mastectomy in 2014. She has never smoked. 10/24/2015 -- she had delayed her vascular test because of her husband surgery but she is now ready to get him taken care of. He is also unable to use compression stockings and hence we will need to order her Juzo wraps. 10/31/2015-- was seen by Dr. Lucky Cowboy on 10/28/2015. She had a left lower extremity arterial duplex done at his office a couple of years ago and that was essentially normal. Today they performed a venous duplex which revealed no evidence of deep vein thrombosis, superficial thrombophlebitis, no venous reflex was seen on the right and a minimal amount of reflux was seen on the left great saphenous vein but no significant reflux was seen. Impression was that there was a component of lymphedema present from a previous surgery and he would recommend compression stockings and leg elevation. Readmission: 07-24-2022 upon evaluation today patient presents for initial inspection here in our clinic concerning issues that she has  been having with her legs this is actually been going on for several years according to what her family member with her today tells me as well as what the patient reiterates as well. She is currently most recently been seeing Dr. Vickki Muff and subsequently he had her in  [place] boots. However 2 weeks ago he referred her to [place] and then subsequently took her out of the [employer] boot wraps at that point. At this time the left leg looks to be worse in the right leg currently. She is on Lasix and lisinopril with hydrochlorothiazide she has high blood pressure she also has issues currently with lower extremity swelling and edema which has been an ongoing issue for her as well. Patient does have a history of chronic venous hypertension, lymphedema, diabetes mellitus type 2, hypertension, peripheral vascular disease, and neuropathy. Currently she is on Lasix as well as lisinopril with hydrochlorothiazide. Patient History Information obtained from Patient. Allergies Sulfa (Sulfonamide Antibiotics) Family History Heart Disease - Mother,Father, Hypertension - Mother,Father, Stroke - Mother,Father, No family history of Cancer, Diabetes, Hereditary Spherocytosis, Kidney Disease, Lung Disease, Seizures, Thyroid Problems, Tuberculosis. Social History Never smoker, Marital Status - Married, Alcohol Use - Never, Drug Use - No History, Caffeine Use - Daily. Medical History Eyes Patient has history of Cataracts - removed 2 years ago Denies history of Glaucoma, Optic Neuritis Ear/Nose/Mouth/Throat ALBINA, [name]. ([id number]) Denies history of Chronic sinus problems/congestion, Middle ear problems Hematologic/Lymphatic Patient has history of Lymphedema Denies history of Anemia, Hemophilia, Human Immunodeficiency Virus, Sickle Cell Disease Respiratory Denies history of Aspiration, Asthma, Chronic Obstructive Pulmonary Disease (COPD), Pneumothorax, Sleep Apnea, Tuberculosis Cardiovascular Patient has history of Hypertension, Peripheral Arterial Disease, Peripheral Venous Disease Gastrointestinal Denies history of Cirrhosis , Colitis, Crohn s, Hepatitis A, Hepatitis B, Hepatitis C Endocrine Patient has history of Type II Diabetes Musculoskeletal Patient has history of  Osteoarthritis Neurologic Patient has history of Neuropathy Denies history of Dementia, Quadriplegia, Paraplegia, Seizure Disorder Psychiatric Denies history of Anorexia/bulimia, Confinement Anxiety Patient is treated with Oral Agents. Blood sugar is not tested. Medical And Surgical History Notes Genitourinary Hx of Kidney stones, hysterectomy Musculoskeletal Rotator cuff disorder Oncologic Breast Ca Objective Constitutional sitting or standing blood pressure is within target range for patient.. pulse regular and within target range for patient.Marland Kitchen respirations regular, non- labored and within target range for patient.Marland Kitchen temperature within target range for patient.. Well-nourished and well-hydrated in no acute distress. Vitals Time Taken: 1:05 PM, Height: 59 in, Weight: 122 lbs, BMI: 24.6, Temperature: 98.0 F, Pulse: 85 bpm, Respiratory Rate: 18 breaths/min, Blood Pressure: 125/72 mmHg. Eyes conjunctiva clear no eyelid edema noted. pupils equal round and reactive to light and accommodation. Ears, Nose, Mouth, and Throat no gross abnormality of ear auricles or external auditory canals. normal hearing noted during conversation. mucus membranes moist. Respiratory normal breathing without difficulty. Cardiovascular 1+ dorsalis pedis/posterior tibialis pulses. 2+ pitting edema of the bilateral lower extremities. Musculoskeletal normal gait and posture. no significant deformity or arthritic changes, no loss or range of motion, no clubbing. Psychiatric this patient is able to make decisions and demonstrates good insight into disease process. Alert and Oriented x 3. pleasant and cooperative. General Notes: Upon inspection patient's wound bed actually showed signs at all locations of not being too bad nor to deep although the worst is the left leg for sure it also appears to be cellulitic based on what I am seeing. Fortunately I do not think there is any evidence of systemic infection noted  based on  what we are seeing at this point which is good news. Integumentary (Hair, Skin) Wound #3 status is Open. Original cause of wound was Gradually Appeared. The date acquired was: 05/14/2022. The wound is located on the Right,Lateral Lower Leg. The wound measures 1.2cm length x 1.6cm width x 0.2cm depth; 1.508cm^2 area and 0.302cm^3 volume. There is Fat Layer (Subcutaneous Tissue) exposed. There is no tunneling or undermining noted. There is a large amount of serous drainage noted. The wound margin is indistinct and nonvisible. There is no granulation within the wound bed. There is a large (67-100%) amount of necrotic tissue within the [facility], [name]. ([id number]) wound bed including Adherent Slough. Wound #4 status is Open. Original cause of wound was Gradually Appeared. The date acquired was: 05/14/2022. The wound is located on the Left,Midline,Anterior Lower Leg. The wound measures 9cm length x 7cm width x 0.2cm depth; 49.48cm^2 area and 9.896cm^3 volume. There is Fat Layer (Subcutaneous Tissue) exposed. There is no tunneling or undermining noted. There is a large amount of serous drainage noted. The wound margin is indistinct and nonvisible. There is medium (34-66%) pink granulation within the wound bed. There is a medium (34-66%) amount of necrotic tissue within the wound bed including Adherent Slough. Wound #5 status is Open. Original cause of wound was Gradually Appeared. The date acquired was: 05/14/2022. The wound is located on the Left,Posterior Lower Leg. The wound measures 0.9cm length x 4cm width x 0.1cm depth; 2.827cm^2 area and 0.283cm^3 volume. There is Fat Layer (Subcutaneous Tissue) exposed. There is no tunneling or undermining noted. There is a large amount of drainage noted. The wound margin is indistinct and nonvisible. There is no granulation within the wound bed. There is a large (67-100%) amount of necrotic tissue within the wound bed including Adherent  Slough. Assessment Active Problems ICD-10 Chronic venous hypertension (idiopathic) with ulcer and inflammation of bilateral lower extremity Lymphedema, not elsewhere classified Non-pressure chronic ulcer of other part of left lower leg with fat layer exposed Non-pressure chronic ulcer of other part of right lower leg with fat layer exposed Type 2 diabetes mellitus with other skin ulcer Essential (primary) hypertension Other specified peripheral vascular diseases Type 2 diabetes mellitus with diabetic autonomic (poly)neuropathy Procedures Wound #3 Pre-procedure diagnosis of Wound #3 is a Venous Leg Ulcer located on the Right,Lateral Lower Leg .Severity of Tissue Pre Debridement is: Fat layer exposed. There was a Chemical/Enzymatic/Mechanical debridement performed by Tommie Sams., PA-C. With the following instrument(s): saline and gauze. Other agent used was saline gauze. A time out was conducted at 13:53, prior to the start of the procedure. There was no bleeding. The procedure was tolerated well. Post Debridement Measurements: 1.2cm length x 1.6cm width x 0.2cm depth; 0.302cm^3 volume. Character of Wound/Ulcer Post Debridement is stable. Severity of Tissue Post Debridement is: Fat layer exposed. Post procedure Diagnosis Wound #3: Same as Pre-Procedure Wound #4 Pre-procedure diagnosis of Wound #4 is a Venous Leg Ulcer located on the Left,Midline,Anterior Lower Leg .Severity of Tissue Pre Debridement is: Fat layer exposed. There was a Chemical/Enzymatic/Mechanical debridement performed by Tommie Sams., PA-C. With the following instrument (s): saline and gauze. Other agent used was saline gauze. A time out was conducted at 13:53, prior to the start of the procedure. There was no bleeding. The procedure was tolerated well. Post Debridement Measurements: 9cm length x 7cm width x 0.2cm depth; 9.896cm^3 volume. Character of Wound/Ulcer Post Debridement is stable. Severity of Tissue Post  Debridement is: Fat layer exposed. Post procedure Diagnosis  Wound #4: Same as Pre-Procedure Wound #5 Pre-procedure diagnosis of Wound #5 is a Venous Leg Ulcer located on the Left,Posterior Lower Leg .Severity of Tissue Pre Debridement is: Fat layer exposed. There was a Chemical/Enzymatic/Mechanical debridement performed by Nelida Meuse., PA-C. With the following instrument(s): saline and gauze. Other agent used was saline gauze. A time out was conducted at 13:53, prior to the start of the procedure. There was no bleeding. The procedure was tolerated well. Post Debridement Measurements: 0.9cm length x 4cm width x 0.1cm depth; 0.283cm^3 volume. Character of Wound/Ulcer Post Debridement is stable. Severity of Tissue Post Debridement is: Fat layer exposed. Post procedure Diagnosis Wound #5: Same as Pre-Procedure Plan Follow-up Appointments: Wound #3 Right,Lateral Lower Leg: Return Appointment in 1 week. Nurse Visit as needed [name], [name] ([id number]) Wound #4 Left,Midline,Anterior Lower Leg: Return Appointment in 1 week. Nurse Visit as needed Wound #5 Left,Posterior Lower Leg: Return Appointment in 1 week. Nurse Visit as needed Bathing/ Shower/ Hygiene: May shower; gently cleanse wound with antibacterial soap, rinse and pat dry prior to dressing wounds Anesthetic (Use 'Patient Medications' Section for Anesthetic Order Entry): Lidocaine applied to wound bed Edema Control - Lymphedema / Segmental Compressive Device / Other: Tubigrip single layer applied. - Size: D Medications-Please add to medication list.: P.O. Antibiotics Laboratory ordered were: Wound culture routine - Left lower leg Services and Therapies ordered were: Arterial Studies- Bilateral - ABIs/TBIs The following medication(s) was prescribed: doxycycline hyclate oral 100 mg capsule 1 1 capsule oral taken 2 times per day for 14 days. Do not take iron with this medication starting 07/24/2022 WOUND #3: - Lower Leg Wound  Laterality: Right, Lateral Cleanser: Byram Ancillary Kit - 15 Day Supply 3 x Per Week/30 Days Discharge Instructions: Use supplies as instructed; Kit contains: (15) Saline Bullets; (15) 3x3 Gauze; 15 pr Gloves Primary Dressing: Silvercel Small 2x2 (in/in) 3 x Per Week/30 Days Discharge Instructions: Apply Silvercel Small 2x2 (in/in) as instructed Secondary Dressing: ABD Pad 5x9 (in/in) 3 x Per Week/30 Days Discharge Instructions: Cover with ABD pad WOUND #4: - Lower Leg Wound Laterality: Left, Midline, Anterior Cleanser: Byram Ancillary Kit - 15 Day Supply 3 x Per Week/30 Days Discharge Instructions: Use supplies as instructed; Kit contains: (15) Saline Bullets; (15) 3x3 Gauze; 15 pr Gloves Primary Dressing: Silvercel 4 1/4x 4 1/4 (in/in) (DME) (Generic) 3 x Per Week/30 Days Discharge Instructions: Apply Silvercel 4 1/4x 4 1/4 (in/in) as instructed Secondary Dressing: ABD Pad 5x9 (in/in) 3 x Per Week/30 Days Discharge Instructions: Cover with ABD pad WOUND #5: - Lower Leg Wound Laterality: Left, Posterior Cleanser: Byram Ancillary Kit - 15 Day Supply 3 x Per Week/30 Days Discharge Instructions: Use supplies as instructed; Kit contains: (15) Saline Bullets; (15) 3x3 Gauze; 15 pr Gloves Primary Dressing: Silvercel Small 2x2 (in/in) 3 x Per Week/30 Days Discharge Instructions: Apply Silvercel Small 2x2 (in/in) as instructed Secondary Dressing: ABD Pad 5x9 (in/in) 3 x Per Week/30 Days Discharge Instructions: Cover with ABD pad 1. Based on what I am seeing currently I would go ahead and send in a prescription for doxycycline and that was sent into the pharmacy today. 2. I am also can recommend that we have the patient continue to monitor for any signs of worsening or infection. Obviously if anything changes she should contact the office and let me know but right now I really feel like that the doxycycline would be a good option at least until we get the results back of the culture make any  adjustments as necessary at that point. 3. I am going to suggest as well that the patient continue to monitor for any evidence of the wound worsening. There could be changing this at home using Tubigrip for edema control. 4. Being that she has not had an arterial study since 2018 I do believe it would be a good idea for [place] to double check on this. Patient is in agreement with that plan. We will see patient back for reevaluation in 1 week here in the clinic. If anything worsens or changes patient will contact our office for additional recommendations. Electronic Signature(s) Signed: 07/24/2022 5:11:35 PM By: Worthy Keeler PA-C Entered By: Worthy Keeler on 07/24/2022 17:11:35 [place], [name] ([id number]) -------------------------------------------------------------------------------- ROS/PFSH Details Patient Name: [name]. Date of Service: 07/24/2022 1:00 PM Medical Record Number: [id number] Patient Account Number: [id number] Date of Birth/Sex: Jun 10, 1941 (81 y.o. F) Treating RN: Cornell Barman Primary Care Provider: Tracie Harrier Other Clinician: Referring Provider: Samara Deist Treating Provider/Extender: Skipper Cliche in Treatment: 0 Information Obtained From Patient Eyes Medical History: Positive for: Cataracts - removed 2 years ago Negative for: Glaucoma; Optic Neuritis Ear/Nose/Mouth/Throat Medical History: Negative for: Chronic sinus problems/congestion; Middle ear problems Hematologic/Lymphatic Medical History: Positive for: Lymphedema Negative for: Anemia; Hemophilia; Human Immunodeficiency Virus; Sickle Cell Disease Respiratory Medical History: Negative for: Aspiration; Asthma; Chronic Obstructive Pulmonary Disease (COPD); Pneumothorax; Sleep Apnea; Tuberculosis Cardiovascular Medical History: Positive for: Hypertension; Peripheral Arterial Disease; Peripheral Venous Disease Gastrointestinal Medical History: Negative for: Cirrhosis ; Colitis;  Crohnos; Hepatitis A; Hepatitis B; Hepatitis C Endocrine Medical History: Positive for: Type II Diabetes Time with diabetes: 8years Treated with: Oral agents Blood sugar tested every day: No Genitourinary Medical History: Past Medical History Notes: Hx of Kidney stones, hysterectomy Musculoskeletal Medical History: Positive for: Osteoarthritis Past Medical History Notes: Rotator cuff disorder Neurologic [name], [name]. ([id number]) Medical History: Positive for: Neuropathy Negative for: Dementia; Quadriplegia; Paraplegia; Seizure Disorder Oncologic Medical History: Past Medical History Notes: Breast Ca Psychiatric Medical History: Negative for: Anorexia/bulimia; Confinement Anxiety HBO Extended History Items Eyes: Cataracts Immunizations Pneumococcal Vaccine: Received Pneumococcal Vaccination: Yes Received Pneumococcal Vaccination On or After 60th Birthday: Yes Implantable Devices None Family and Social History Cancer: No; Diabetes: No; Heart Disease: Yes - Mother,Father; Hereditary Spherocytosis: No; Hypertension: Yes - Mother,Father; Kidney Disease: No; Lung Disease: No; Seizures: No; Stroke: Yes - Mother,Father; Thyroid Problems: No; Tuberculosis: No; Never smoker; Marital Status - Married; Alcohol Use: Never; Drug Use: No History; Caffeine Use: Daily; Financial Concerns: No; Food, Clothing or Shelter Needs: No; Support System Lacking: No; Transportation Concerns: No Electronic Signature(s) Signed: 07/24/2022 5:16:25 PM By: Worthy Keeler PA-C Signed: 07/24/2022 5:46:57 PM By: Gretta Cool, BSN, RN, CWS, Kim RN, BSN Entered By: Gretta Cool, BSN, RN, CWS, Kim on 07/24/2022 13:47:41 [place], [name] ([id number]) -------------------------------------------------------------------------------- Baggs Details Patient Name: [name] Ramus L. Date of Service: 07/24/2022 Medical Record Number: [id number] Patient Account Number: [id number] Date of Birth/Sex: [birth year] (81  y.o. F) Treating RN: Cornell Barman Primary Care Provider: Tracie Harrier Other Clinician: Referring Provider: Samara Deist Treating Provider/Extender: Jeri Cos Weeks in Treatment: 0 Diagnosis Coding ICD-10 Codes Code Description [phone] Chronic venous hypertension (idiopathic) with ulcer and inflammation of bilateral lower extremity I89.0 Lymphedema, not elsewhere classified L97.822 Non-pressure chronic ulcer of other part of left lower leg with fat layer exposed L97.812 Non-pressure chronic ulcer of other part of right lower leg with fat layer exposed E11.622 Type 2 diabetes mellitus with other skin ulcer I10 Essential (primary) hypertension I73.89 Other specified  peripheral vascular diseases E11.43 Type 2 diabetes mellitus with diabetic autonomic (poly)neuropathy Facility Procedures CPT4 Code: [id number] Description: [phone] - WOUND CARE VISIT-LEV 5 EST PT Modifier: Quantity: 1 Physician Procedures CPT4 Code Description: [id number] - WC PHYS LEVEL 4 - NEW PT Modifier: Quantity: 1 CPT4 Code Description: ICD-10 Diagnosis Description I87.333 Chronic venous hypertension (idiopathic) with ulcer and inflammation of I89.0 Lymphedema, not elsewhere classified L97.822 Non-pressure chronic ulcer of other part of left lower leg with fat  laye L97.812 Non-pressure chronic ulcer of other part of right lower leg with fat lay Modifier: bilateral lower extre r exposed er exposed Quantity: Art gallery manager Signature(s) Signed: 07/24/2022 5:13:45 PM By: Worthy Keeler PA-C Entered By: Worthy Keeler on 07/24/2022 17:13:45

## 2022-07-24 NOTE — Progress Notes (Signed)
[name], [name] ([id number]) Visit Report for 07/24/2022 Allergy List Details Patient Name: [name], [name]. Date of Service: 07/24/2022 1:00 PM Medical Record Number: [id number] Patient Account Number: [id number] Date of Birth/Sex: [birth year] (81 y.o. F) Treating RN: Cornell Barman Primary Care Takenya Travaglini: Tracie Harrier Other Clinician: Referring Jennamarie Goings: Samara Deist Treating Kyrell Ruacho/Extender: Skipper Cliche in Treatment: 0 Allergies Active Allergies Sulfa (Sulfonamide Antibiotics) Allergy Notes Electronic Signature(s) Signed: 07/24/2022 5:46:57 PM By: Gretta Cool, BSN, RN, CWS, Kim RN, BSN Entered By: Gretta Cool, BSN, RN, CWS, Kim on 07/24/2022 13:06:53 [place], [place] [name] ([id number]) -------------------------------------------------------------------------------- Arrival Information Details Patient Name: [name]. Date of Service: 07/24/2022 1:00 PM Medical Record Number: [id number] Patient Account Number: [id number] Date of Birth/Sex: 1941/07/10 (81 y.o. F) Treating RN: Cornell Barman Primary Care Valeska Haislip: Tracie Harrier Other Clinician: Referring Sunjai Levandoski: Samara Deist Treating Mirah Nevins/Extender: Skipper Cliche in Treatment: 0 Visit Information Patient Arrived: Wheel Chair Arrival Time: 13:03 Accompanied By: son Transfer Assistance: Manual Patient Identification Verified: Yes Secondary Verification Process Completed: Yes Patient Has Alerts: Yes Patient Alerts: Type II Diabetic History Since Last Visit Electronic Signature(s) Signed: 07/24/2022 5:46:57 PM By: Gretta Cool, BSN, RN, CWS, Kim RN, BSN Entered By: Gretta Cool, BSN, RN, CWS, Kim on 07/24/2022 13:05:06 [place], [place] [name] ([id number]) -------------------------------------------------------------------------------- Clinic Level of Care Assessment Details Patient Name: [name] Ramus L. Date of Service: 07/24/2022 1:00 PM Medical Record Number: [id number] Patient Account Number: [id number] Date of  Birth/Sex: 01/20/1941 (81 y.o. F) Treating RN: Cornell Barman Primary Care Jamel Holzmann: Tracie Harrier Other Clinician: Referring Darrek Leasure: Samara Deist Treating Jennel Mara/Extender: Skipper Cliche in Treatment: 0 Clinic Level of Care Assessment Items TOOL 2 Quantity Score '[]'$  - Use when only an EandM is performed on the INITIAL visit 0 ASSESSMENTS - Nursing Assessment / Reassessment X - General Physical Exam (combine w/ comprehensive assessment (listed just below) when performed on new 1 20 pt. evals) X- 1 25 Comprehensive Assessment (HX, ROS, Risk Assessments, Wounds Hx, etc.) ASSESSMENTS - Wound and Skin Assessment / Reassessment '[]'$  - Simple Wound Assessment / Reassessment - one wound 0 X- 3 5 Complex Wound Assessment / Reassessment - multiple wounds '[]'$  - 0 Dermatologic / Skin Assessment (not related to wound area) ASSESSMENTS - Ostomy and/or Continence Assessment and Care '[]'$  - Incontinence Assessment and Management 0 '[]'$  - 0 Ostomy Care Assessment and Management (repouching, etc.) PROCESS - Coordination of Care X - Simple Patient / Family Education for ongoing care 1 15 '[]'$  - 0 Complex (extensive) Patient / Family Education for ongoing care X- 1 10 Staff obtains Programmer, systems, Records, Test Results / Process Orders '[]'$  - 0 Staff telephones HHA, Nursing Homes / Clarify orders / etc '[]'$  - 0 Routine Transfer to another Facility (non-emergent condition) '[]'$  - 0 Routine Hospital Admission (non-emergent condition) X- 1 15 New Admissions / Biomedical engineer / Ordering NPWT, Apligraf, etc. '[]'$  - 0 Emergency Hospital Admission (emergent condition) '[]'$  - 0 Simple Discharge Coordination '[]'$  - 0 Complex (extensive) Discharge Coordination PROCESS - Special Needs '[]'$  - Pediatric / Minor Patient Management 0 '[]'$  - 0 Isolation Patient Management '[]'$  - 0 Hearing / Language / Visual special needs '[]'$  - 0 Assessment of Community assistance (transportation, D/C planning, etc.) '[]'$  -  0 Additional assistance / Altered mentation '[]'$  - 0 Support Surface(s) Assessment (bed, cushion, seat, etc.) INTERVENTIONS - Wound Cleansing / Measurement X - Wound Imaging (photographs - any number of wounds) 1 5 '[]'$  - 0 Wound Tracing (instead of photographs) '[]'$  - 0 Simple Wound Measurement - one wound X-  3 5 Complex Wound Measurement - multiple wounds Mccaskey, Yalena L. ([id number]) '[]'$  - 0 Simple Wound Cleansing - one wound X- 3 5 Complex Wound Cleansing - multiple wounds INTERVENTIONS - Wound Dressings '[]'$  - Small Wound Dressing one or multiple wounds 0 X- 2 15 Medium Wound Dressing one or multiple wounds '[]'$  - 0 Large Wound Dressing one or multiple wounds '[]'$  - 0 Application of Medications - injection INTERVENTIONS - Miscellaneous '[]'$  - External ear exam 0 '[]'$  - 0 Specimen Collection (cultures, biopsies, blood, body fluids, etc.) '[]'$  - 0 Specimen(s) / Culture(s) sent or taken to Lab for analysis '[]'$  - 0 Patient Transfer (multiple staff / Civil Service fast streamer / Similar devices) '[]'$  - 0 Simple Staple / Suture removal (25 or less) '[]'$  - 0 Complex Staple / Suture removal (26 or more) '[]'$  - 0 Hypo / Hyperglycemic Management (close monitor of Blood Glucose) '[]'$  - 0 Ankle / Brachial Index (ABI) - do not check if billed separately Has the patient been seen at the hospital within the last three years: Yes Total Score: 165 Level Of Care: New/Established - Level 5 Electronic Signature(s) Signed: 07/24/2022 5:46:57 PM By: Gretta Cool, BSN, RN, CWS, Kim RN, BSN Entered By: Gretta Cool, BSN, RN, CWS, Kim on 07/24/2022 14:12:33 [name] ([id number]) -------------------------------------------------------------------------------- Encounter Discharge Information Details Patient Name: [name] Ramus L. Date of Service: 07/24/2022 1:00 PM Medical Record Number: [id number] Patient Account Number: [id number] Date of Birth/Sex: 1941/03/11 (81 y.o. F) Treating RN: Cornell Barman Primary Care Angelo Caroll:  Tracie Harrier Other Clinician: Referring Madina Galati: Samara Deist Treating Tallin Hart/Extender: Skipper Cliche in Treatment: 0 Encounter Discharge Information Items Post Procedure Vitals Discharge Condition: Stable Temperature (F): 98 Ambulatory Status: Ambulatory Pulse (bpm): 85 Discharge Destination: Home Respiratory Rate (breaths/min): 16 Transportation: Private Auto Blood Pressure (mmHg): 125/72 Accompanied By: son Schedule Follow-up Appointment: Yes Clinical Summary of Care: Electronic Signature(s) Signed: 07/24/2022 5:46:57 PM By: Gretta Cool, BSN, RN, CWS, Kim RN, BSN Entered By: Gretta Cool, BSN, RN, CWS, Kim on 07/24/2022 14:13:49 [place], [place] [name] ([id number]) -------------------------------------------------------------------------------- Lower Extremity Assessment Details Patient Name: RUDELL, MARLOWE L. Date of Service: 07/24/2022 1:00 PM Medical Record Number: [id number] Patient Account Number: [id number] Date of Birth/Sex: 25-Oct-1941 (81 y.o. F) Treating RN: Cornell Barman Primary Care Omario Ander: Tracie Harrier Other Clinician: Referring Lee Kalt: Samara Deist Treating Kenzie Thoreson/Extender: Jeri Cos Weeks in Treatment: 0 Edema Assessment Assessed: [Left: No] [Right: No] [Left: Edema] [Right: :] Calf Left: Right: Point of Measurement: 30 cm From Medial Instep 36 cm 31.5 cm Ankle Left: Right: Point of Measurement: 10 cm From Medial Instep 22 cm 22.3 cm Vascular Assessment Pulses: Dorsalis Pedis Palpable: [Left:Yes Inaudible] [Right:Yes Inaudible] Notes Unable to get ABI due to fading pulses. Electronic Signature(s) Signed: 07/24/2022 5:46:57 PM By: Gretta Cool, BSN, RN, CWS, Kim RN, BSN Entered By: Gretta Cool, BSN, RN, CWS, Kim on 07/24/2022 13:46:03 [place], [place] [name] ([id number]) -------------------------------------------------------------------------------- Multi Wound Chart Details Patient Name: [name]. Date of Service: 07/24/2022 1:00 PM Medical  Record Number: [id number] Patient Account Number: [id number] Date of Birth/Sex: [birth year] (81 y.o. F) Treating RN: Cornell Barman Primary Care Marquise Lambson: Tracie Harrier Other Clinician: Referring Niall Illes: Samara Deist Treating Micaiah Litle/Extender: Skipper Cliche in Treatment: 0 Vital Signs Height(in): 59 Pulse(bpm): 38 Weight(lbs): 122 Blood Pressure(mmHg): 125/72 Body Mass Index(BMI): 24.6 Temperature(F): 98.0 Respiratory Rate(breaths/min): 18 Photos: Wound Location: Right, Lateral Lower Leg Right, Midline, Anterior Lower Leg Right, Posterior Lower Leg Wounding Event: Gradually Appeared Gradually Appeared Gradually Appeared Primary Etiology: Lymphedema Lymphedema Lymphedema Secondary Etiology: Diabetic Wound/Ulcer of the Lower Diabetic  Wound/Ulcer of the Lower Diabetic Wound/Ulcer of the Lower Extremity Extremity Extremity Comorbid History: Cataracts, Hypertension, Type II Cataracts, Hypertension, Type II Cataracts, Hypertension, Type II Diabetes, Osteoarthritis Diabetes, Osteoarthritis Diabetes, Osteoarthritis Date Acquired: 05/14/2022 05/14/2022 05/14/2022 Weeks of Treatment: 0 0 0 Wound Status: Open Open Open Wound Recurrence: No No No Measurements L x W x D (cm) 1.2x1.6x0.2 9x7x0.2 0.9x4x0.1 Area (cm) : 1.508 49.48 2.827 Volume (cm) : 0.302 9.896 0.283 % Reduction in Area: 0.00% 0.00% 0.00% % Reduction in Volume: 0.00% 0.00% 0.00% Classification: Full Thickness Without Exposed Full Thickness Without Exposed Full Thickness Without Exposed Support Structures Support Structures Support Structures Exudate Amount: Large Large Large Exudate Type: Serous Serous N/A Exudate Color: Physiological scientist N/A Wound Margin: Indistinct, nonvisible Indistinct, nonvisible Indistinct, nonvisible Granulation Amount: None Present (0%) Medium (34-66%) None Present (0%) Granulation Quality: N/A Pink N/A Necrotic Amount: Large (67-100%) Medium (34-66%) Large (67-100%) Exposed Structures: Fat Layer  (Subcutaneous Tissue): Fat Layer (Subcutaneous Tissue): Fat Layer (Subcutaneous Tissue): Yes Yes Yes Fascia: No Fascia: No Fascia: No Tendon: No Tendon: No Tendon: No Muscle: No Muscle: No Muscle: No Joint: No Joint: No Joint: No Bone: No Bone: No Bone: No Epithelialization: None None None Treatment Notes Electronic Signature(s) Signed: 07/24/2022 5:46:57 PM By: Gretta Cool, BSN, RN, CWS, Kim RN, BSN Entered By: Gretta Cool, BSN, RN, CWS, Kim on 07/24/2022 13:52:52 [place], [place] [name] ([id number]) [place], [place] [name] ([id number]) -------------------------------------------------------------------------------- Dwight Details Patient Name: [name] Ramus L. Date of Service: 07/24/2022 1:00 PM Medical Record Number: [id number] Patient Account Number: [id number] Date of Birth/Sex: May 05, 1941 (81 y.o. F) Treating RN: Cornell Barman Primary Care Vendela Troung: Tracie Harrier Other Clinician: Referring Zyria Fiscus: Samara Deist Treating Alani Sabbagh/Extender: Skipper Cliche in Treatment: 0 Active Inactive Necrotic Tissue Nursing Diagnoses: Impaired tissue integrity related to necrotic/devitalized tissue Knowledge deficit related to management of necrotic/devitalized tissue Goals: Necrotic/devitalized tissue will be minimized in the wound bed Date Initiated: 07/24/2022 Target Resolution Date: 07/24/2022 Goal Status: Active Patient/caregiver will verbalize understanding of reason and process for debridement of necrotic tissue Date Initiated: 07/24/2022 Target Resolution Date: 07/24/2022 Goal Status: Active Interventions: Assess patient pain level pre-, during and post procedure and prior to discharge Provide education on necrotic tissue and debridement process Treatment Activities: Excisional debridement : 07/24/2022 Notes: Orientation to the Wound Care Program Nursing Diagnoses: Knowledge deficit related to the wound healing center program Goals: Patient/caregiver will  verbalize understanding of the [place] Date Initiated: 07/24/2022 Target Resolution Date: 07/24/2022 Goal Status: Active Interventions: Provide education on orientation to the wound center Notes: Soft Tissue Infection Nursing Diagnoses: Impaired tissue integrity Potential for infection: soft tissue Goals: Patient/caregiver will verbalize understanding of or measures to prevent infection and contamination in the home setting Date Initiated: 07/24/2022 Target Resolution Date: 07/24/2022 Goal Status: Active Patient's soft tissue infection will resolve Date Initiated: 07/24/2022 Target Resolution Date: 07/24/2022 Goal Status: Active Signs and symptoms of infection will be recognized early to allow for prompt treatment Date Initiated: 07/24/2022 Target Resolution Date: 07/24/2022 Goal Status: Active ALICHA, RASPBERRY ([id number]) Interventions: Assess signs and symptoms of infection every visit Treatment Activities: Culture and sensitivity : 07/24/2022 Systemic antibiotics : 07/24/2022 Notes: Venous Leg Ulcer Nursing Diagnoses: Actual venous Insuffiency (use after diagnosis is confirmed) Knowledge deficit related to disease process and management Potential for venous Insuffiency (use before diagnosis confirmed) Goals: Patient will maintain optimal edema control Date Initiated: 07/24/2022 Target Resolution Date: 07/24/2022 Goal Status: Active Patient/caregiver will verbalize understanding of disease process and disease management Date Initiated: 07/24/2022  Target Resolution Date: 07/24/2022 Goal Status: Active Verify adequate tissue perfusion prior to therapeutic compression application Date Initiated: 07/24/2022 Target Resolution Date: 07/24/2022 Goal Status: Active Interventions: Assess peripheral edema status every visit. Treatment Activities: Non-invasive vascular studies : 07/24/2022 Notes: Wound/Skin Impairment Nursing Diagnoses: Impaired tissue  integrity Knowledge deficit related to smoking impact on wound healing Knowledge deficit related to ulceration/compromised skin integrity Goals: Patient/caregiver will verbalize understanding of skin care regimen Date Initiated: 07/24/2022 Target Resolution Date: 07/24/2022 Goal Status: Active Ulcer/skin breakdown will have a volume reduction of 30% by week 4 Date Initiated: 07/24/2022 Target Resolution Date: 08/21/2022 Goal Status: Active Interventions: Provide education on ulcer and skin care Treatment Activities: Referred to DME Shalayna Ornstein for dressing supplies : 07/24/2022 Skin care regimen initiated : 07/24/2022 Topical wound management initiated : 07/24/2022 Notes: Electronic Signature(s) Signed: 07/24/2022 5:46:57 PM By: Gretta Cool, BSN, RN, CWS, Kim RN, BSN Entered By: Gretta Cool, BSN, RN, CWS, Kim on 07/24/2022 13:52:35 [place], [place] [name] ([id number]) -------------------------------------------------------------------------------- Pain Assessment Details Patient Name: [name] Ramus L. Date of Service: 07/24/2022 1:00 PM Medical Record Number: [id number] Patient Account Number: [id number] Date of Birth/Sex: 10-10-1941 (81 y.o. F) Treating RN: Cornell Barman Primary Care Heidi Lemay: Tracie Harrier Other Clinician: Referring Toshiko Kemler: Samara Deist Treating Burma Ketcher/Extender: Skipper Cliche in Treatment: 0 Active Problems Location of Pain Severity and Description of Pain Patient Has Paino Yes Site Locations Pain Location: Generalized Pain, Pain in Ulcers Rate the pain. Current Pain Level: 7 Pain Management and Medication Current Pain Management: Notes Pain in legs, neck and back. Electronic Signature(s) Signed: 07/24/2022 5:46:57 PM By: Gretta Cool, BSN, RN, CWS, Kim RN, BSN Entered By: Gretta Cool, BSN, RN, CWS, Kim on 07/24/2022 13:05:39 [place], [place] [name] ([id number]) -------------------------------------------------------------------------------- Patient/Caregiver Education  Details Patient Name: VEORA, FONTE. Date of Service: 07/24/2022 1:00 PM Medical Record Number: [id number] Patient Account Number: [id number] Date of Birth/Gender: [birth year] (81 y.o. F) Treating RN: Cornell Barman Primary Care Physician: Tracie Harrier Other Clinician: Referring Physician: Samara Deist Treating Physician/Extender: Skipper Cliche in Treatment: 0 Education Assessment Education Provided To: Patient Education Topics Provided Welcome To [facility]: Handouts: Welcome To [facility] Methods: Demonstration, Explain/Verbal Responses: State content correctly Wound/Skin Impairment: Handouts: Caring for Your Ulcer Methods: Demonstration, Explain/Verbal Responses: State content correctly Electronic Signature(s) Signed: 07/24/2022 5:46:57 PM By: Gretta Cool, BSN, RN, CWS, Kim RN, BSN Entered By: Gretta Cool, BSN, RN, CWS, Kim on 07/24/2022 14:13:04 [place], [place] [name] ([id number]) -------------------------------------------------------------------------------- Wound Assessment Details Patient Name: [name] Ramus L. Date of Service: 07/24/2022 1:00 PM Medical Record Number: [id number] Patient Account Number: [id number] Date of Birth/Sex: November 09, 1941 (81 y.o. F) Treating RN: Cornell Barman Primary Care Markevion Lattin: Tracie Harrier Other Clinician: Referring Debhora Titus: Samara Deist Treating Georgena Weisheit/Extender: Jeri Cos Weeks in Treatment: 0 Wound Status Wound Number: 3 Primary Etiology: Lymphedema Wound Location: Right, Lateral Lower Leg Secondary Diabetic Wound/Ulcer of the Lower Extremity Etiology: Wounding Event: Gradually Appeared Wound Status: Open Date Acquired: 05/14/2022 Comorbid History: Cataracts, Hypertension, Type II Diabetes, Weeks Of Treatment: 0 Osteoarthritis Clustered Wound: No Photos Wound Measurements Length: (cm) 1.2 Width: (cm) 1.6 Depth: (cm) 0.2 Area: (cm) 1.508 Volume: (cm) 0.302 % Reduction in Area: 0% % Reduction in  Volume: 0% Epithelialization: None Tunneling: No Undermining: No Wound Description Classification: Full Thickness Without Exposed Support Structu Wound Margin: Indistinct, nonvisible Exudate Amount: Large Exudate Type: Serous Exudate Color: amber res Foul Odor After Cleansing: No Slough/Fibrino Yes Wound Bed Granulation Amount: None Present (0%) Exposed Structure Necrotic Amount: Large (67-100%) Fascia Exposed: No Necrotic Quality: Adherent  Slough Fat Layer (Subcutaneous Tissue) Exposed: Yes Tendon Exposed: No Muscle Exposed: No Joint Exposed: No Bone Exposed: No Electronic Signature(s) Signed: 07/24/2022 5:46:57 PM By: Gretta Cool, BSN, RN, CWS, Kim RN, BSN Entered By: Gretta Cool, BSN, RN, CWS, Kim on 07/24/2022 13:23:13 [place], [place] [name] ([id number]) -------------------------------------------------------------------------------- Wound Assessment Details Patient Name: [name] Ramus L. Date of Service: 07/24/2022 1:00 PM Medical Record Number: [id number] Patient Account Number: [id number] Date of Birth/Sex: Dec 27, 1940 (81 y.o. F) Treating RN: Cornell Barman Primary Care Ulysse Siemen: Tracie Harrier Other Clinician: Referring Zaylen Susman: Samara Deist Treating Jaryn Rosko/Extender: Jeri Cos Weeks in Treatment: 0 Wound Status Wound Number: 4 Primary Etiology: Lymphedema Wound Location: Right, Midline, Anterior Lower Leg Secondary Diabetic Wound/Ulcer of the Lower Extremity Etiology: Wounding Event: Gradually Appeared Wound Status: Open Date Acquired: 05/14/2022 Comorbid History: Cataracts, Hypertension, Type II Diabetes, Weeks Of Treatment: 0 Osteoarthritis Clustered Wound: No Photos Wound Measurements Length: (cm) 9 Width: (cm) 7 Depth: (cm) 0.2 Area: (cm) 49.48 Volume: (cm) 9.896 % Reduction in Area: 0% % Reduction in Volume: 0% Epithelialization: None Tunneling: No Undermining: No Wound Description Classification: Full Thickness Without Exposed Support Structu Wound  Margin: Indistinct, nonvisible Exudate Amount: Large Exudate Type: Serous Exudate Color: amber res Foul Odor After Cleansing: No Slough/Fibrino Yes Wound Bed Granulation Amount: Medium (34-66%) Exposed Structure Granulation Quality: Pink Fascia Exposed: No Necrotic Amount: Medium (34-66%) Fat Layer (Subcutaneous Tissue) Exposed: Yes Necrotic Quality: Adherent Slough Tendon Exposed: No Muscle Exposed: No Joint Exposed: No Bone Exposed: No Electronic Signature(s) Signed: 07/24/2022 5:46:57 PM By: Gretta Cool, BSN, RN, CWS, Kim RN, BSN Entered By: Gretta Cool, BSN, RN, CWS, Kim on 07/24/2022 13:24:14 [place], [place] [name] ([id number]) -------------------------------------------------------------------------------- Wound Assessment Details Patient Name: [name] Ramus L. Date of Service: 07/24/2022 1:00 PM Medical Record Number: [id number] Patient Account Number: [id number] Date of Birth/Sex: Jul 15, 1941 (81 y.o. F) Treating RN: Cornell Barman Primary Care Mette Southgate: Tracie Harrier Other Clinician: Referring Kirandeep Fariss: Samara Deist Treating Amdrew Oboyle/Extender: Jeri Cos Weeks in Treatment: 0 Wound Status Wound Number: 5 Primary Etiology: Lymphedema Wound Location: Right, Posterior Lower Leg Secondary Diabetic Wound/Ulcer of the Lower Extremity Etiology: Wounding Event: Gradually Appeared Wound Status: Open Date Acquired: 05/14/2022 Comorbid History: Cataracts, Hypertension, Type II Diabetes, Weeks Of Treatment: 0 Osteoarthritis Clustered Wound: No Photos Wound Measurements Length: (cm) 0.9 Width: (cm) 4 Depth: (cm) 0.1 Area: (cm) 2.827 Volume: (cm) 0.283 % Reduction in Area: 0% % Reduction in Volume: 0% Epithelialization: None Tunneling: No Undermining: No Wound Description Classification: Full Thickness Without Exposed Support Structure Wound Margin: Indistinct, nonvisible Exudate Amount: Large s Foul Odor After Cleansing: No Slough/Fibrino No Wound Bed Granulation Amount:  None Present (0%) Exposed Structure Necrotic Amount: Large (67-100%) Fascia Exposed: No Necrotic Quality: Adherent Slough Fat Layer (Subcutaneous Tissue) Exposed: Yes Tendon Exposed: No Muscle Exposed: No Joint Exposed: No Bone Exposed: No Electronic Signature(s) Signed: 07/24/2022 5:46:57 PM By: Gretta Cool, BSN, RN, CWS, Kim RN, BSN Entered By: Gretta Cool, BSN, RN, CWS, Kim on 07/24/2022 13:24:56 [place], [place] [name] ([id number]) -------------------------------------------------------------------------------- Vitals Details Patient Name: [name] Ramus L. Date of Service: 07/24/2022 1:00 PM Medical Record Number: [id number] Patient Account Number: [id number] Date of Birth/Sex: Sep 03, 1941 (81 y.o. F) Treating RN: Cornell Barman Primary Care Dandra Velardi: Tracie Harrier Other Clinician: Referring Greogory Cornette: Samara Deist Treating Shantese Raven/Extender: Skipper Cliche in Treatment: 0 Vital Signs Time Taken: 13:05 Temperature (F): 98.0 Height (in): 59 Pulse (bpm): 85 Weight (lbs): 122 Respiratory Rate (breaths/min): 18 Body Mass Index (BMI): 24.6 Blood Pressure (mmHg): 125/72 Reference Range: 80 - 120 mg / dl Electronic Signature(s) Signed: 07/24/2022  5:46:57 PM By: Gretta Cool, BSN, RN, CWS, Kim RN, BSN Entered By: Gretta Cool, BSN, RN, CWS, Kim on 07/24/2022 13:06:11

## 2022-07-24 NOTE — Progress Notes (Signed)
MARIEELENA, BARTKO ([id number]) Visit Report for 07/24/2022 Abuse Risk Screen Details Patient Name: [name], [name]. Date of Service: 07/24/2022 1:00 PM Medical Record Number: [id number] Patient Account Number: [id number] Date of Birth/Sex: [birth year] (81 y.o. F) Treating RN: Cornell Barman Primary Care Haileigh Pitz: Tracie Harrier Other Clinician: Referring Jerryl Holzhauer: Samara Deist Treating Tangala Wiegert/Extender: Skipper Cliche in Treatment: 0 Abuse Risk Screen Items Answer ABUSE RISK SCREEN: Has anyone close to you tried to hurt or harm you recentlyo No Do you feel uncomfortable with anyone in your familyo No Has anyone forced you do things that you didnot want to doo No Electronic Signature(s) Signed: 07/24/2022 5:46:57 PM By: Gretta Cool, BSN, RN, CWS, Kim RN, BSN Entered By: Gretta Cool, BSN, RN, CWS, Kim on 07/24/2022 13:08:44 [place], [name] ([id number]) -------------------------------------------------------------------------------- Activities of Daily Living Details Patient Name: [name], [name]. Date of Service: 07/24/2022 1:00 PM Medical Record Number: [id number] Patient Account Number: [id number] Date of Birth/Sex: January 03, 1941 (81 y.o. F) Treating RN: Cornell Barman Primary Care Laurette Villescas: Tracie Harrier Other Clinician: Referring Tyneisha Hegeman: Samara Deist Treating Illias Pantano/Extender: Skipper Cliche in Treatment: 0 Activities of Daily Living Items Answer Activities of Daily Living (Please select one for each item) Drive Automobile Not Able Take Medications Need Assistance Use Telephone Need Assistance Care for Appearance Need Assistance Use Toilet Need Assistance Bath / Shower Need Assistance Dress Self Need Assistance Feed Self Need Assistance Walk Need Assistance Get In / Out Bed Need Assistance Housework Need Assistance Prepare Meals Need Assistance Handle Money Need Assistance Shop for Self Need Assistance Electronic Signature(s) Signed: 07/24/2022 5:46:57 PM By:  Gretta Cool, BSN, RN, CWS, Kim RN, BSN Entered By: Gretta Cool, BSN, RN, CWS, Kim on 07/24/2022 13:09:12 [place], [name] ([id number]) -------------------------------------------------------------------------------- Education Screening Details Patient Name: [name] Nims. Date of Service: 07/24/2022 1:00 PM Medical Record Number: [id number] Patient Account Number: [id number] Date of Birth/Sex: 1941-10-31 (81 y.o. F) Treating RN: Cornell Barman Primary Care Erilyn Pearman: Tracie Harrier Other Clinician: Referring Blandina Renaldo: Samara Deist Treating Jania Steinke/Extender: Skipper Cliche in Treatment: 0 Primary Learner Assessed: Patient Learning Preferences/Education Level/Primary Language Learning Preference: Explanation, Demonstration Preferred Language: English Cognitive Barrier Language Barrier: No Translator Needed: No Memory Deficit: No Emotional Barrier: No Cultural/Religious Beliefs Affecting Medical Care: No Physical Barrier Impaired Hearing: Yes hearing loss Knowledge/Comprehension Knowledge Level: High Comprehension Level: High Ability to understand written instructions: High Ability to understand verbal instructions: High Motivation Anxiety Level: Calm Cooperation: Cooperative Education Importance: Acknowledges Need Interest in Health Problems: Asks Questions Perception: Coherent Willingness to Engage in Self-Management High Activities: Readiness to Engage in Self-Management High Activities: Electronic Signature(s) Signed: 07/24/2022 5:46:57 PM By: Gretta Cool, BSN, RN, CWS, Kim RN, BSN Entered By: Gretta Cool, BSN, RN, CWS, Kim on 07/24/2022 13:09:40 [place], [name] ([id number]) -------------------------------------------------------------------------------- Fall Risk Assessment Details Patient Name: [name] Nims. Date of Service: 07/24/2022 1:00 PM Medical Record Number: [id number] Patient Account Number: [id number] Date of Birth/Sex: September 23, 1941 (81 y.o. F) Treating RN:  Cornell Barman Primary Care Rumi Taras: Tracie Harrier Other Clinician: Referring Dominie Benedick: Samara Deist Treating Kyle Stansell/Extender: Skipper Cliche in Treatment: 0 Fall Risk Assessment Items Have you had 2 or more falls in the last 12 monthso 0 No Have you had any fall that resulted in injury in the last 12 monthso 0 No FALLS RISK SCREEN History of falling - immediate or within 3 months 25 Yes Secondary diagnosis (Do you have 2 or more medical diagnoseso) 0 No Ambulatory aid None/bed rest/wheelchair/nurse 0 Yes Crutches/cane/walker 0 No Furniture 0 No Intravenous therapy Access/Saline/Heparin Lock 0 No  Gait/Transferring Normal/ bed rest/ wheelchair 0 Yes Weak (short steps with or without shuffle, stooped but able to lift head while walking, may 0 No seek support from furniture) Impaired (short steps with shuffle, may have difficulty arising from chair, head down, impaired 0 No balance) Mental Status Oriented to own ability 0 Yes Electronic Signature(s) Signed: 07/24/2022 5:46:57 PM By: Gretta Cool, BSN, RN, CWS, Kim RN, BSN Entered By: Gretta Cool, BSN, RN, CWS, Kim on 07/24/2022 13:09:57 [place], [name] ([id number]) -------------------------------------------------------------------------------- Foot Assessment Details Patient Name: [name] Ramus L. Date of Service: 07/24/2022 1:00 PM Medical Record Number: [id number] Patient Account Number: [id number] Date of Birth/Sex: 04-Sep-1941 (81 y.o. F) Treating RN: Cornell Barman Primary Care Rosamae Rocque: Tracie Harrier Other Clinician: Referring Chelse Matas: Samara Deist Treating Tevin Shillingford/Extender: Jeri Cos Weeks in Treatment: 0 Foot Assessment Items Site Locations + = Sensation present, - = Sensation absent, C = Callus, U = Ulcer R = Redness, W = Warmth, M = Maceration, PU = Pre-ulcerative lesion F = Fissure, S = Swelling, D = Dryness Assessment Right: Left: Other Deformity: No No Prior Foot Ulcer: No No Prior Amputation: No  No Charcot Joint: No No Ambulatory Status: Non-ambulatory Assistance Device: Wheelchair Gait: Engineer, maintenance) Signed: 07/24/2022 5:46:57 PM By: Gretta Cool, BSN, RN, CWS, Kim RN, BSN Entered By: Gretta Cool, BSN, RN, CWS, Kim on 07/24/2022 13:13:07 [place], [name] ([id number]) -------------------------------------------------------------------------------- Nutrition Risk Screening Details Patient Name: [name] Ramus L. Date of Service: 07/24/2022 1:00 PM Medical Record Number: [id number] Patient Account Number: [id number] Date of Birth/Sex: 01/24/1941 (81 y.o. F) Treating RN: Cornell Barman Primary Care Charly Hunton: Tracie Harrier Other Clinician: Referring Caralina Nop: Samara Deist Treating Tiquan Bouch/Extender: Jeri Cos Weeks in Treatment: 0 Height (in): 59 Weight (lbs): 122 Body Mass Index (BMI): 24.6 Nutrition Risk Screening Items Score Screening NUTRITION RISK SCREEN: I have an illness or condition that made me change the kind and/or amount of food I eat 0 No I eat fewer than two meals per day 0 No I eat few fruits and vegetables, or milk products 0 No I have three or more drinks of beer, liquor or wine almost every day 0 No I have tooth or mouth problems that make it hard for me to eat 0 No I don't always have enough money to buy the food I need 0 No I eat alone most of the time 0 No I take three or more different prescribed or over-the-counter drugs a day 1 Yes Without wanting to, I have lost or gained 10 pounds in the last six months 0 No I am not always physically able to shop, cook and/or feed myself 0 No Nutrition Protocols Good Risk Protocol 0 No interventions needed Moderate Risk Protocol High Risk Proctocol Risk Level: Good Risk Score: 1 Electronic Signature(s) Signed: 07/24/2022 5:46:57 PM By: Gretta Cool, BSN, RN, CWS, Kim RN, BSN Entered By: Gretta Cool, BSN, RN, CWS, Kim on 07/24/2022 13:10:17

## 2022-07-25 ENCOUNTER — Other Ambulatory Visit
Admission: RE | Admit: 2022-07-25 | Discharge: 2022-07-25 | Disposition: A | Discharge status: Home / Self Care | Payer: PPO | Source: Ambulatory Visit | Attending: Physician Assistant | Admitting: Physician Assistant

## 2022-07-25 DIAGNOSIS — B999 Unspecified infectious disease: Secondary | ICD-10-CM | POA: Insufficient documentation

## 2022-07-26 ENCOUNTER — Other Ambulatory Visit (INDEPENDENT_AMBULATORY_CARE_PROVIDER_SITE_OTHER): Payer: Self-pay | Admitting: Physician Assistant

## 2022-07-26 DIAGNOSIS — S81809S Unspecified open wound, unspecified lower leg, sequela: Secondary | ICD-10-CM

## 2022-07-26 DIAGNOSIS — M79606 Pain in leg, unspecified: Secondary | ICD-10-CM

## 2022-07-27 ENCOUNTER — Ambulatory Visit (INDEPENDENT_AMBULATORY_CARE_PROVIDER_SITE_OTHER): Payer: PPO

## 2022-07-27 DIAGNOSIS — M79606 Pain in leg, unspecified: Secondary | ICD-10-CM | POA: Diagnosis not present

## 2022-07-27 DIAGNOSIS — S81809S Unspecified open wound, unspecified lower leg, sequela: Secondary | ICD-10-CM | POA: Diagnosis not present

## 2022-07-29 LAB — AEROBIC CULTURE W GRAM STAIN (SUPERFICIAL SPECIMEN): Gram Stain: NONE SEEN

## 2022-08-02 ENCOUNTER — Encounter: Payer: PPO | Admitting: Physician Assistant

## 2022-08-02 DIAGNOSIS — I87333 Chronic venous hypertension (idiopathic) with ulcer and inflammation of bilateral lower extremity: Secondary | ICD-10-CM | POA: Diagnosis not present

## 2022-08-02 DIAGNOSIS — L97812 Non-pressure chronic ulcer of other part of right lower leg with fat layer exposed: Secondary | ICD-10-CM | POA: Diagnosis not present

## 2022-08-02 DIAGNOSIS — L97222 Non-pressure chronic ulcer of left calf with fat layer exposed: Secondary | ICD-10-CM | POA: Diagnosis not present

## 2022-08-02 DIAGNOSIS — L97822 Non-pressure chronic ulcer of other part of left lower leg with fat layer exposed: Secondary | ICD-10-CM | POA: Diagnosis not present

## 2022-08-02 NOTE — Progress Notes (Addendum)
[name] ([id number]) Visit Report for 08/02/2022 Chief Complaint Document Details Patient Name: [name], [name]. Date of Service: 08/02/2022 11:00 AM Medical Record Number: [id number] Patient Account Number: [id number] Date of Birth/Sex: 04-07-1941 (81 y.o. F) Treating RN: Carlene Coria Primary Care Provider: Tracie Harrier Other Clinician: Referring Provider: Samara Deist Treating Provider/Extender: Skipper Cliche in Treatment: 1 Information Obtained from: Patient Chief Complaint Bilateral LE Ulcers Electronic Signature(s) Signed: 08/02/2022 11:17:38 AM By: Worthy Keeler PA-C Entered By: Worthy Keeler on 08/02/2022 11:17:38 [name], [name] ([id number]) -------------------------------------------------------------------------------- HPI Details Patient Name: [name]. Date of Service: 08/02/2022 11:00 AM Medical Record Number: [id number] Patient Account Number: [id number] Date of Birth/Sex: 09/18/1941 (81 y.o. F) Treating RN: Carlene Coria Primary Care Provider: Tracie Harrier Other Clinician: Referring Provider: Samara Deist Treating Provider/Extender: Skipper Cliche in Treatment: 1 History of Present Illness HPI Description: 81 year old patient who comes with a referral for bilateral lower extremity edema and a lower extremity ulceration and has been sent by her PCP Dr. Placido Sou. I understand the patient was recently put on amoxicillin and doxycycline but could not tolerate the amoxicillin. doxycycline course was completed. a BNP and EKG was supposed to be normal and the patient did not have any dyspnea. the patient has been on a diuretic. The patient was also prescribed a pair of elastic compression stockings of the 20-30 mmHg pressure variety. x-ray of the right ankle was done on 09/20/2015 and showed posttraumatic and postsurgical changes of the right ankle with secondary degenerative changes of the tibiotalar joint and to a lesser  degree the subtalar joint. No definite acute bony abnormalities are noted. Past medical history significant for diabetes mellitus, hypertension, hyperlipidemia, right breast cancer treated with a mastectomy in 2014. She has never smoked. 10/24/2015 -- she had delayed her vascular test because of her husband surgery but she is now ready to get him taken care of. He is also unable to use compression stockings and hence we will need to order her Juzo wraps. 10/31/2015-- was seen by Dr. Lucky Cowboy on 10/28/2015. She had a left lower extremity arterial duplex done at his office a couple of years ago and that was essentially normal. Today they performed a venous duplex which revealed no evidence of deep vein thrombosis, superficial thrombophlebitis, no venous reflex was seen on the right and a minimal amount of reflux was seen on the left great saphenous vein but no significant reflux was seen. Impression was that there was a component of lymphedema present from a previous surgery and he would recommend compression stockings and leg elevation. Readmission: 07-24-2022 upon evaluation today patient presents for initial inspection here in our clinic concerning issues that she has been having with her legs this is actually been going on for several years according to what her family member with her today tells me as well as what the patient reiterates as well. She is currently most recently been seeing Dr. Vickki Muff and subsequently he had her in [place] boots. However 2 weeks ago he referred her to [place] and then subsequently took her out of the [employer] boot wraps at that point. At this time the left leg looks to be worse in the right leg currently. She is on Lasix and lisinopril with hydrochlorothiazide she has high blood pressure she also has issues currently with lower extremity swelling and edema which has been an ongoing issue for her as well. Patient does have a history of chronic venous hypertension, lymphedema, diabetes  mellitus type  2, hypertension, peripheral vascular disease, and neuropathy. Currently she is on Lasix as well as lisinopril with hydrochlorothiazide. 08-02-2022 upon evaluation today patient presents for reevaluation the good news is she is actually doing somewhat better in regard to the wound and the overall appearance and sinuses. The unfortunate thing is her infection really is not significantly improved we did have to switch out her antibiotic once we got that final result back and I switched her to Levaquin and away from the doxycycline. Unfortunately the doxycycline had been doing poorly for her. In fact she had had diarrhea from the time she started taking it on Friday and she is still having it when she shows up today for evaluation. Again I was not aware of this and obviously she does appear to be somewhat dehydrated as well based on what I see. My concern which I discussed with the patient today is the possibility of a C. difficile infection. With that being said fact this started immediately upon taking the doxycycline makes me think that it was just the medicine and is not completely out of her system yet despite having taken the last dose Tuesday morning. Nonetheless with what we are seeing currently I want to be sure that reason I Minna contact her primary care provider and see if a would be willing to see her and test for C. difficile infection. Electronic Signature(s) Signed: 08/02/2022 2:54:56 PM By: Worthy Keeler PA-C Entered By: Worthy Keeler on 08/02/2022 14:54:56 [name], [name] ([id number]) -------------------------------------------------------------------------------- Physical Exam Details Patient Name: [name] Ramus L. Date of Service: 08/02/2022 11:00 AM Medical Record Number: [id number] Patient Account Number: [id number] Date of Birth/Sex: [birth year] (81 y.o. F) Treating RN: Carlene Coria Primary Care Provider: Tracie Harrier Other Clinician: Referring  Provider: Samara Deist Treating Provider/Extender: Skipper Cliche in Treatment: 1 Constitutional Well-nourished and well-hydrated in no acute distress. Respiratory normal breathing without difficulty. Psychiatric this patient is able to make decisions and demonstrates good insight into disease process. Alert and Oriented x 3. pleasant and cooperative. Notes Upon inspection patient's wound bed actually showed signs of doing a little bit better. The more important thing is she still does have evidence of cellulitis in regard to the leg but right now with the severe diarrhea that she is having I am concerned that we need to make sure she does not have a C. difficile infection and give her a chance for her body to recover before we proceed with any additional antibiotics long-term at least. She voiced understanding. Nonetheless I am also going to contact her primary care provider to see if he is willing to see her and evaluate for the possibility of a C. difficile infection to ensure were not missing anything here. Electronic Signature(s) Signed: 08/02/2022 2:55:51 PM By: Worthy Keeler PA-C Entered By: Worthy Keeler on 08/02/2022 14:55:51 [name], [name] ([id number]) -------------------------------------------------------------------------------- Physician Orders Details Patient Name: [name] Date of Service: 08/02/2022 11:00 AM Medical Record Number: [id number] Patient Account Number: [id number] Date of Birth/Sex: [birth year] (81 y.o. F) Treating RN: Carlene Coria Primary Care Provider: Tracie Harrier Other Clinician: Referring Provider: Samara Deist Treating Provider/Extender: Skipper Cliche in Treatment: 1 Verbal / Phone Orders: No Diagnosis Coding ICD-10 Coding Code Description [phone] Chronic venous hypertension (idiopathic) with ulcer and inflammation of bilateral lower extremity I89.0 Lymphedema, not elsewhere classified L97.822 Non-pressure chronic  ulcer of other part of left lower leg with fat layer exposed L97.812 Non-pressure chronic ulcer of other part of right lower  leg with fat layer exposed E11.622 Type 2 diabetes mellitus with other skin ulcer I10 Essential (primary) hypertension I73.89 Other specified peripheral vascular diseases E11.43 Type 2 diabetes mellitus with diabetic autonomic (poly)neuropathy Follow-up Appointments Wound #3 Right,Lateral Lower Leg o Return Appointment in 1 week. o Nurse Visit as needed Wound #4 Left,Midline,Anterior Lower Leg o Return Appointment in 1 week. o Nurse Visit as needed Wound #5 Left,Posterior Lower Leg o Return Appointment in 1 week. o Nurse Visit as needed Bathing/ Shower/ Hygiene o May shower; gently cleanse wound with antibacterial soap, rinse and pat dry prior to dressing wounds Anesthetic (Use 'Patient Medications' Section for Anesthetic Order Entry) o Lidocaine applied to wound bed Edema Control - Lymphedema / Segmental Compressive Device / Other o Tubigrip single layer applied. - Size: C Medications-Please add to medication list. o P.O. Antibiotics Wound Treatment Wound #3 - Lower Leg Wound Laterality: Right, Lateral Cleanser: Byram Ancillary Kit - 15 Day Supply 3 x Per Week/30 Days Discharge Instructions: Use supplies as instructed; Kit contains: (15) Saline Bullets; (15) 3x3 Gauze; 15 pr Gloves Primary Dressing: Silvercel Small 2x2 (in/in) 3 x Per Week/30 Days Discharge Instructions: Apply Silvercel Small 2x2 (in/in) as instructed Secondary Dressing: ABD Pad 5x9 (in/in) 3 x Per Week/30 Days Discharge Instructions: Cover with ABD pad Wound #4 - Lower Leg Wound Laterality: Left, Midline, Anterior Cleanser: Byram Ancillary Kit - 15 Day Supply 3 x Per Week/30 Days Discharge Instructions: Use supplies as instructed; Kit contains: (15) Saline Bullets; (15) 3x3 Gauze; 15 pr Gloves Primary Dressing: Silvercel 4 1/4x 4 1/4 (in/in) (Generic) 3 x Per Week/30  Days [place], Taela L. ([id number]) Discharge Instructions: Apply Silvercel 4 1/4x 4 1/4 (in/in) as instructed Secondary Dressing: ABD Pad 5x9 (in/in) 3 x Per Week/30 Days Discharge Instructions: Cover with ABD pad Secured With: Tubigrip Size C, 2.75x10 (in/yd) 3 x Per Week/30 Days Discharge Instructions: Apply 3 Tubigrip C 3-finger-widths below knee to base of toes to secure dressing and/or for swelling. Wound #5 - Lower Leg Wound Laterality: Left, Posterior Cleanser: Byram Ancillary Kit - 15 Day Supply 3 x Per Week/30 Days Discharge Instructions: Use supplies as instructed; Kit contains: (15) Saline Bullets; (15) 3x3 Gauze; 15 pr Gloves Primary Dressing: Silvercel Small 2x2 (in/in) 3 x Per Week/30 Days Discharge Instructions: Apply Silvercel Small 2x2 (in/in) as instructed Secondary Dressing: ABD Pad 5x9 (in/in) 3 x Per Week/30 Days Discharge Instructions: Cover with ABD pad Secured With: Tubigrip Size C, 2.75x10 (in/yd) 3 x Per Week/30 Days Discharge Instructions: Apply 3 Tubigrip C 3-finger-widths below knee to base of toes to secure dressing and/or for swelling. Electronic Signature(s) Signed: 08/03/2022 9:44:45 AM By: Carlene Coria RN Signed: 08/03/2022 1:36:52 PM By: Worthy Keeler PA-C Entered By: Carlene Coria on 08/02/2022 11:37:48 [name], [name] ([id number]) -------------------------------------------------------------------------------- Problem List Details Patient Name: [name] Ramus L. Date of Service: 08/02/2022 11:00 AM Medical Record Number: [id number] Patient Account Number: [id number] Date of Birth/Sex: 16-Sep-1941 (81 y.o. F) Treating RN: Carlene Coria Primary Care Provider: Tracie Harrier Other Clinician: Referring Provider: Samara Deist Treating Provider/Extender: Skipper Cliche in Treatment: 1 Active Problems ICD-10 Encounter Code Description Active Date MDM Diagnosis I87.333 Chronic venous hypertension (idiopathic) with ulcer and inflammation of  07/24/2022 No Yes bilateral lower extremity I89.0 Lymphedema, not elsewhere classified 07/24/2022 No Yes L97.822 Non-pressure chronic ulcer of other part of left lower leg with fat layer 07/24/2022 No Yes exposed L97.812 Non-pressure chronic ulcer of other part of right lower leg with fat layer 07/24/2022 No Yes  exposed E11.622 Type 2 diabetes mellitus with other skin ulcer 07/24/2022 No Yes I10 Essential (primary) hypertension 07/24/2022 No Yes I73.89 Other specified peripheral vascular diseases 07/24/2022 No Yes E11.43 Type 2 diabetes mellitus with diabetic autonomic (poly)neuropathy 07/24/2022 No Yes Inactive Problems Resolved Problems Electronic Signature(s) Signed: 08/02/2022 11:17:34 AM By: Worthy Keeler PA-C Entered By: Worthy Keeler on 08/02/2022 11:17:34 [name], [name] ([id number]) -------------------------------------------------------------------------------- Progress Note Details Patient Name: [name] Ramus L. Date of Service: 08/02/2022 11:00 AM Medical Record Number: [id number] Patient Account Number: [id number] Date of Birth/Sex: 01-17-1941 (81 y.o. F) Treating RN: Carlene Coria Primary Care Provider: Tracie Harrier Other Clinician: Referring Provider: Samara Deist Treating Provider/Extender: Skipper Cliche in Treatment: 1 Subjective Chief Complaint Information obtained from Patient Bilateral LE Ulcers History of Present Illness (HPI) 81 year old patient who comes with a referral for bilateral lower extremity edema and a lower extremity ulceration and has been sent by her PCP Dr. Placido Sou. I understand the patient was recently put on amoxicillin and doxycycline but could not tolerate the amoxicillin. doxycycline course was completed. a BNP and EKG was supposed to be normal and the patient did not have any dyspnea. the patient has been on a diuretic. The patient was also prescribed a pair of elastic compression stockings of the 20-30 mmHg pressure  variety. x-ray of the right ankle was done on 09/20/2015 and showed posttraumatic and postsurgical changes of the right ankle with secondary degenerative changes of the tibiotalar joint and to a lesser degree the subtalar joint. No definite acute bony abnormalities are noted. Past medical history significant for diabetes mellitus, hypertension, hyperlipidemia, right breast cancer treated with a mastectomy in 2014. She has never smoked. 10/24/2015 -- she had delayed her vascular test because of her husband surgery but she is now ready to get him taken care of. He is also unable to use compression stockings and hence we will need to order her Juzo wraps. 10/31/2015-- was seen by Dr. Lucky Cowboy on 10/28/2015. She had a left lower extremity arterial duplex done at his office a couple of years ago and that was essentially normal. Today they performed a venous duplex which revealed no evidence of deep vein thrombosis, superficial thrombophlebitis, no venous reflex was seen on the right and a minimal amount of reflux was seen on the left great saphenous vein but no significant reflux was seen. Impression was that there was a component of lymphedema present from a previous surgery and he would recommend compression stockings and leg elevation. Readmission: 07-24-2022 upon evaluation today patient presents for initial inspection here in our clinic concerning issues that she has been having with her legs this is actually been going on for several years according to what her family member with her today tells me as well as what the patient reiterates as well. She is currently most recently been seeing Dr. Vickki Muff and subsequently he had her in [place] boots. However 2 weeks ago he referred her to [place] and then subsequently took her out of the [employer] boot wraps at that point. At this time the left leg looks to be worse in the right leg currently. She is on Lasix and lisinopril with hydrochlorothiazide she has high blood  pressure she also has issues currently with lower extremity swelling and edema which has been an ongoing issue for her as well. Patient does have a history of chronic venous hypertension, lymphedema, diabetes mellitus type 2, hypertension, peripheral vascular disease, and neuropathy. Currently she is on Lasix as well as  lisinopril with hydrochlorothiazide. 08-02-2022 upon evaluation today patient presents for reevaluation the good news is she is actually doing somewhat better in regard to the wound and the overall appearance and sinuses. The unfortunate thing is her infection really is not significantly improved we did have to switch out her antibiotic once we got that final result back and I switched her to Levaquin and away from the doxycycline. Unfortunately the doxycycline had been doing poorly for her. In fact she had had diarrhea from the time she started taking it on Friday and she is still having it when she shows up today for evaluation. Again I was not aware of this and obviously she does appear to be somewhat dehydrated as well based on what I see. My concern which I discussed with the patient today is the possibility of a C. difficile infection. With that being said fact this started immediately upon taking the doxycycline makes me think that it was just the medicine and is not completely out of her system yet despite having taken the last dose Tuesday morning. Nonetheless with what we are seeing currently I want to be sure that reason I Minna contact her primary care provider and see if a would be willing to see her and test for C. difficile infection. Objective Constitutional Well-nourished and well-hydrated in no acute distress. Vitals Time Taken: 11:24 AM, Height: 59 in, Weight: 122 lbs, BMI: 24.6, Temperature: 98.2 F, Pulse: 79 bpm, Respiratory Rate: 18 breaths/min, Blood Pressure: 125/71 mmHg. Respiratory Wente, [place] ([id number]) normal breathing without  difficulty. Psychiatric this patient is able to make decisions and demonstrates good insight into disease process. Alert and Oriented x 3. pleasant and cooperative. General Notes: Upon inspection patient's wound bed actually showed signs of doing a little bit better. The more important thing is she still does have evidence of cellulitis in regard to the leg but right now with the severe diarrhea that she is having I am concerned that we need to make sure she does not have a C. difficile infection and give her a chance for her body to recover before we proceed with any additional antibiotics long-term at least. She voiced understanding. Nonetheless I am also going to contact her primary care provider to see if he is willing to see her and evaluate for the possibility of a C. difficile infection to ensure were not missing anything here. Integumentary (Hair, Skin) Wound #3 status is Open. Original cause of wound was Gradually Appeared. The date acquired was: 05/14/2022. The wound has been in treatment 1 weeks. The wound is located on the Right,Lateral Lower Leg. The wound measures 1cm length x 0.8cm width x 0.1cm depth; 0.628cm^2 area and 0.063cm^3 volume. There is no tunneling or undermining noted. There is a medium amount of serosanguineous drainage noted. The wound margin is indistinct and nonvisible. There is medium (34-66%) pink granulation within the wound bed. There is a medium (34-66%) amount of necrotic tissue within the wound bed including Adherent Slough. Wound #4 status is Open. Original cause of wound was Gradually Appeared. The date acquired was: 05/14/2022. The wound has been in treatment 1 weeks. The wound is located on the Left,Midline,Anterior Lower Leg. The wound measures 10cm length x 7cm width x 0.1cm depth; 54.978cm^2 area and 5.498cm^3 volume. There is Fat Layer (Subcutaneous Tissue) exposed. There is no tunneling or undermining noted. There is a medium amount of serosanguineous  drainage noted. The wound margin is indistinct and nonvisible. There is medium (34-66%) pink granulation  within the wound bed. There is a medium (34-66%) amount of necrotic tissue within the wound bed including Adherent Slough. Wound #5 status is Open. Original cause of wound was Gradually Appeared. The date acquired was: 05/14/2022. The wound has been in treatment 1 weeks. The wound is located on the Left,Posterior Lower Leg. The wound measures 1.1cm length x 1cm width x 0.1cm depth; 0.864cm^2 area and 0.086cm^3 volume. There is Fat Layer (Subcutaneous Tissue) exposed. There is no tunneling or undermining noted. There is a medium amount of serosanguineous drainage noted. The wound margin is indistinct and nonvisible. There is medium (34-66%) granulation within the wound bed. There is a medium (34-66%) amount of necrotic tissue within the wound bed including Adherent Slough. Assessment Active Problems ICD-10 Chronic venous hypertension (idiopathic) with ulcer and inflammation of bilateral lower extremity Lymphedema, not elsewhere classified Non-pressure chronic ulcer of other part of left lower leg with fat layer exposed Non-pressure chronic ulcer of other part of right lower leg with fat layer exposed Type 2 diabetes mellitus with other skin ulcer Essential (primary) hypertension Other specified peripheral vascular diseases Type 2 diabetes mellitus with diabetic autonomic (poly)neuropathy Plan Follow-up Appointments: Wound #3 Right,Lateral Lower Leg: Return Appointment in 1 week. Nurse Visit as needed Wound #4 Left,Midline,Anterior Lower Leg: Return Appointment in 1 week. Nurse Visit as needed Wound #5 Left,Posterior Lower Leg: Return Appointment in 1 week. Nurse Visit as needed Bathing/ Shower/ Hygiene: May shower; gently cleanse wound with antibacterial soap, rinse and pat dry prior to dressing wounds Anesthetic (Use 'Patient Medications' Section for Anesthetic Order  Entry): Lidocaine applied to wound bed Edema Control - Lymphedema / Segmental Compressive Device / Other: Tubigrip single layer applied. - Size: C Medications-Please add to medication list.: P.O. Antibiotics WOUND #3: - Lower Leg Wound Laterality: Right, Lateral Cleanser: Byram Ancillary Kit - 15 Day Supply 3 x Per Week/30 Days Discharge Instructions: Use supplies as instructed; Kit contains: (15) Saline Bullets; (15) 3x3 Gauze; 15 pr Gloves Primary Dressing: Silvercel Small 2x2 (in/in) 3 x Per Week/30 Days Ratliff, Lauralye L. ([id number]) Discharge Instructions: Apply Silvercel Small 2x2 (in/in) as instructed Secondary Dressing: ABD Pad 5x9 (in/in) 3 x Per Week/30 Days Discharge Instructions: Cover with ABD pad WOUND #4: - Lower Leg Wound Laterality: Left, Midline, Anterior Cleanser: Byram Ancillary Kit - 15 Day Supply 3 x Per Week/30 Days Discharge Instructions: Use supplies as instructed; Kit contains: (15) Saline Bullets; (15) 3x3 Gauze; 15 pr Gloves Primary Dressing: Silvercel 4 1/4x 4 1/4 (in/in) (Generic) 3 x Per Week/30 Days Discharge Instructions: Apply Silvercel 4 1/4x 4 1/4 (in/in) as instructed Secondary Dressing: ABD Pad 5x9 (in/in) 3 x Per Week/30 Days Discharge Instructions: Cover with ABD pad Secured With: Tubigrip Size C, 2.75x10 (in/yd) 3 x Per Week/30 Days Discharge Instructions: Apply 3 Tubigrip C 3-finger-widths below knee to base of toes to secure dressing and/or for swelling. WOUND #5: - Lower Leg Wound Laterality: Left, Posterior Cleanser: Byram Ancillary Kit - 15 Day Supply 3 x Per Week/30 Days Discharge Instructions: Use supplies as instructed; Kit contains: (15) Saline Bullets; (15) 3x3 Gauze; 15 pr Gloves Primary Dressing: Silvercel Small 2x2 (in/in) 3 x Per Week/30 Days Discharge Instructions: Apply Silvercel Small 2x2 (in/in) as instructed Secondary Dressing: ABD Pad 5x9 (in/in) 3 x Per Week/30 Days Discharge Instructions: Cover with ABD pad Secured  With: Tubigrip Size C, 2.75x10 (in/yd) 3 x Per Week/30 Days Discharge Instructions: Apply 3 Tubigrip C 3-finger-widths below knee to base of toes to secure dressing and/or for  swelling. 1. I am going to hold the Levaquin right now I would try to get her intestinal tract back under good control I did recommend some yogurt and also the probiotics over-the-counter in order to help with getting things settled as far as this is concerned. Unfortunately she is somewhat dehydrated as well due to the fact that she has not been eating well and has had a tremendous amount of diarrhea over the past week from the doxycycline she did not let me know and I did advise her if anything like that happens before to please contact us to let us know as soon as possible. 2. With regard to the cellulitis of left leg the Levaquin will stay in your system for a little bit of time yet but I still think that it is best to give her a break and see if we can get the diarrhea under control before we proceed with any additional antibiotic therapy. 3. I am also going to recommend that the patient needs to be tested for a C. difficile infection just to make sure were not missing anything there and I did get in touch with Dr. Roque Lias her primary care provider and he is actually going to put in the order for the test and see her back following to discuss it and she is actually going over to pick that up today. I was very appreciative of this as well. 4. In the meantime we will continue to monitor and keep a close eye on things I plan to see her back on Tuesday to see where we stand. We will see patient back for reevaluation in 1 week here in the clinic. If anything worsens or changes patient will contact our office for additional recommendations. Electronic Signature(s) Signed: 08/02/2022 2:57:26 PM By: Worthy Keeler PA-C Entered By: Worthy Keeler on 08/02/2022 14:57:26 Ciszek, [name]  ([id number]) -------------------------------------------------------------------------------- SuperBill Details Patient Name: [name]. Date of Service: 08/02/2022 Medical Record Number: [id number] Patient Account Number: [id number] Date of Birth/Sex: [birth year] (81 y.o. 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 E11.622 Type 2 diabetes mellitus with other skin ulcer I10 Essential (primary) hypertension I73.89 Other specified peripheral vascular diseases E11.43 Type 2 diabetes mellitus with diabetic autonomic (poly)neuropathy Facility Procedures CPT4 Code: [id number] Description: 99214 - WOUND CARE VISIT-LEV 4 EST PT Modifier: Quantity: 1 Physician Procedures CPT4 Code Description: [id number] - WC PHYS LEVEL 4 - EST PT Modifier: Quantity: 1 CPT4 Code Description: ICD-10 Diagnosis Description I87.333 Chronic venous hypertension (idiopathic) with ulcer and inflammation of I89.0 Lymphedema, not elsewhere classified L97.822 Non-pressure chronic ulcer of other part of left lower leg with fat  laye L97.812 Non-pressure chronic ulcer of other part of right lower leg with fat lay Modifier: bilateral lower extre r exposed er exposed Quantity: Art gallery manager Signature(s) Signed: 08/02/2022 2:57:40 PM By: Worthy Keeler PA-C Entered By: Worthy Keeler on 08/02/2022 14:57:39

## 2022-08-02 NOTE — Progress Notes (Addendum)
ADDILEE, NEU ([id number]) Visit Report for 08/02/2022 Arrival Information Details Patient Name: [name], [name]. Date of Service: 08/02/2022 11:00 AM Medical Record Number: [id number] Patient Account Number: [id number] Date of Birth/Sex: Mar 29, 1941 (81 y.o. F) Treating RN: Carlene Coria Primary Care Sharelle Burditt: Tracie Harrier Other Clinician: Referring Prim Morace: Samara Deist Treating Sephira Zellman/Extender: Skipper Cliche in Treatment: 1 Visit Information History Since Last Visit All ordered tests and consults were completed: No Patient Arrived: Wheel Chair Added or deleted any medications: No Arrival Time: 11:19 Any new allergies or adverse reactions: No Accompanied By: son Had a fall or experienced change in No Transfer Assistance: None activities of daily living that may affect Patient Identification Verified: Yes risk of falls: Secondary Verification Process Completed: Yes Signs or symptoms of abuse/neglect since last visito No Patient Has Alerts: Yes Hospitalized since last visit: No Patient Alerts: Type II Diabetic Implantable device outside of the clinic excluding No cellular tissue based products placed in the center since last visit: Has Dressing in Place as Prescribed: Yes Pain Present Now: No Electronic Signature(s) Signed: 08/03/2022 9:44:45 AM By: Carlene Coria RN Entered By: Carlene Coria on 08/02/2022 11:23:42 [place], [name] ([id number]) -------------------------------------------------------------------------------- Clinic Level of Care Assessment Details Patient Name: [name] Date of Service: 08/02/2022 11:00 AM Medical Record Number: [id number] Patient Account Number: [id number] Date of Birth/Sex: May 19, 1941 (81 y.o. F) Treating RN: Carlene Coria Primary Care Samnang Shugars: Tracie Harrier Other Clinician: Referring Char Feltman: Samara Deist Treating Arla Boutwell/Extender: Skipper Cliche in Treatment: 1 Clinic Level of Care Assessment  Items TOOL 4 Quantity Score X - Use when only an EandM is performed on FOLLOW-UP visit 1 0 ASSESSMENTS - Nursing Assessment / Reassessment X - Reassessment of Co-morbidities (includes updates in patient status) 1 10 X- 1 5 Reassessment of Adherence to Treatment Plan ASSESSMENTS - Wound and Skin Assessment / Reassessment []  - Simple Wound Assessment / Reassessment - one wound 0 X- 3 5 Complex Wound Assessment / Reassessment - multiple wounds []  - 0 Dermatologic / Skin Assessment (not related to wound area) ASSESSMENTS - Focused Assessment []  - Circumferential Edema Measurements - multi extremities 0 []  - 0 Nutritional Assessment / Counseling / Intervention []  - 0 Lower Extremity Assessment (monofilament, tuning fork, pulses) []  - 0 Peripheral Arterial Disease Assessment (using hand held doppler) ASSESSMENTS - Ostomy and/or Continence Assessment and Care []  - Incontinence Assessment and Management 0 []  - 0 Ostomy Care Assessment and Management (repouching, etc.) PROCESS - Coordination of Care X - Simple Patient / Family Education for ongoing care 1 15 []  - 0 Complex (extensive) Patient / Family Education for ongoing care []  - 0 Staff obtains Programmer, systems, Records, Test Results / Process Orders []  - 0 Staff telephones HHA, Nursing Homes / Clarify orders / etc []  - 0 Routine Transfer to another Facility (non-emergent condition) []  - 0 Routine Hospital Admission (non-emergent condition) []  - 0 New Admissions / Biomedical engineer / Ordering NPWT, Apligraf, etc. []  - 0 Emergency Hospital Admission (emergent condition) X- 1 10 Simple Discharge Coordination []  - 0 Complex (extensive) Discharge Coordination PROCESS - Special Needs []  - Pediatric / Minor Patient Management 0 []  - 0 Isolation Patient Management []  - 0 Hearing / Language / Visual special needs []  - 0 Assessment of Community assistance (transportation, D/C planning, etc.) []  - 0 Additional assistance /  Altered mentation []  - 0 Support Surface(s) Assessment (bed, cushion, seat, etc.) INTERVENTIONS - Wound Cleansing / Measurement Arrighi, Alexa L. ([id number]) []  - 0 Simple Wound Cleansing -  one wound X- 3 5 Complex Wound Cleansing - multiple wounds X- 1 5 Wound Imaging (photographs - any number of wounds) $RemoveBe'[]'QzADGlATO$  - 0 Wound Tracing (instead of photographs) $RemoveBeforeD'[]'KotdIxUXnGWmoO$  - 0 Simple Wound Measurement - one wound X- 3 5 Complex Wound Measurement - multiple wounds INTERVENTIONS - Wound Dressings X - Small Wound Dressing one or multiple wounds 3 10 $Re'[]'Eln$  - 0 Medium Wound Dressing one or multiple wounds $RemoveBeforeD'[]'mujGdVbXqdaaEY$  - 0 Large Wound Dressing one or multiple wounds $RemoveBeforeD'[]'crRLdmgTZQqwLL$  - 0 Application of Medications - topical $RemoveB'[]'EorJOHVc$  - 0 Application of Medications - injection INTERVENTIONS - Miscellaneous $RemoveBeforeD'[]'BUAscjFSfDmuMf$  - External ear exam 0 $Remo'[]'xxDjs$  - 0 Specimen Collection (cultures, biopsies, blood, body fluids, etc.) $RemoveBefor'[]'noEjylOiEbIC$  - 0 Specimen(s) / Culture(s) sent or taken to Lab for analysis $RemoveBefo'[]'daqlLdTBamM$  - 0 Patient Transfer (multiple staff / Civil Service fast streamer / Similar devices) $RemoveBeforeDE'[]'saGsAXAWjWfpgZT$  - 0 Simple Staple / Suture removal (25 or less) $Remove'[]'tzfEEln$  - 0 Complex Staple / Suture removal (26 or more) $Remove'[]'tUhIwUR$  - 0 Hypo / Hyperglycemic Management (close monitor of Blood Glucose) $RemoveBefore'[]'qCrghGbuiUAVK$  - 0 Ankle / Brachial Index (ABI) - do not check if billed separately X- 1 5 Vital Signs Has the patient been seen at the hospital within the last three years: Yes Total Score: 125 Level Of Care: New/Established - Level 4 Electronic Signature(s) Signed: 08/03/2022 9:44:45 AM By: Carlene Coria RN Entered By: Carlene Coria on 08/02/2022 11:39:39 [place], [name] ([id number]) -------------------------------------------------------------------------------- Encounter Discharge Information Details Patient Name: [name] Date of Service: 08/02/2022 11:00 AM Medical Record Number: [id number] Patient Account Number: [id number] Date of Birth/Sex: Jun 24, 1941 (81 y.o. F) Treating RN: Carlene Coria Primary Care  Marce Charlesworth: Tracie Harrier Other Clinician: Referring Mishon Blubaugh: Samara Deist Treating Anfernee Peschke/Extender: Skipper Cliche in Treatment: 1 Encounter Discharge Information Items Discharge Condition: Stable Ambulatory Status: Wheelchair Discharge Destination: Home Transportation: Private Auto Accompanied By: son Schedule Follow-up Appointment: Yes Clinical Summary of Care: Patient Declined Electronic Signature(s) Signed: 08/03/2022 9:44:45 AM By: Carlene Coria RN Entered By: Carlene Coria on 08/02/2022 11:40:56 [place], [name] ([id number]) -------------------------------------------------------------------------------- Lower Extremity Assessment Details Patient Name: [name] Date of Service: 08/02/2022 11:00 AM Medical Record Number: [id number] Patient Account Number: [id number] Date of Birth/Sex: [birth year] (81 y.o. F) Treating RN: Carlene Coria Primary Care Aric Jost: Tracie Harrier Other Clinician: Referring Amarachi Whelchel: Samara Deist Treating Euna Armon/Extender: Jeri Cos Weeks in Treatment: 1 Edema Assessment Assessed: [Left: No] [Right: No] Edema: [Left: Yes] [Right: Yes] Calf Left: Right: Point of Measurement: 30 cm From Medial Instep 31 cm 28 cm Ankle Left: Right: Point of Measurement: 10 cm From Medial Instep 22 cm 21 cm Vascular Assessment Pulses: Dorsalis Pedis Palpable: [Left:Yes] [Right:Yes] Electronic Signature(s) Signed: 08/03/2022 9:44:45 AM By: Carlene Coria RN Entered By: Carlene Coria on 08/02/2022 11:30:51 [place], [name] ([id number]) -------------------------------------------------------------------------------- Multi Wound Chart Details Patient Name: [name] Date of Service: 08/02/2022 11:00 AM Medical Record Number: [id number] Patient Account Number: [id number] Date of Birth/Sex: 1941-05-01 (81 y.o. F) Treating RN: Carlene Coria Primary Care Maralyn Witherell: Tracie Harrier Other Clinician: Referring Kierah Goatley: Samara Deist Treating Maryl Blalock/Extender: Skipper Cliche in Treatment: 1 Vital Signs Height(in): 59 Pulse(bpm): 9 Weight(lbs): 122 Blood Pressure(mmHg): 125/71 Body Mass Index(BMI): 24.6 Temperature(F): 98.2 Respiratory Rate(breaths/min): 18 Photos: Wound Location: Right, Lateral Lower Leg Left, Midline, Anterior Lower Leg Left, Posterior Lower Leg Wounding Event: Gradually Appeared Gradually Appeared Gradually Appeared Primary Etiology: Venous Leg Ulcer Venous Leg Ulcer Venous Leg Ulcer Secondary Etiology: Diabetic Wound/Ulcer of the Lower Diabetic Wound/Ulcer of the Lower Diabetic Wound/Ulcer of the  Lower Extremity Extremity Extremity Comorbid History: Cataracts, Lymphedema, Cataracts, Lymphedema, Cataracts, Lymphedema, Hypertension, Peripheral Arterial Hypertension, Peripheral Arterial Hypertension, Peripheral Arterial Disease, Peripheral Venous Disease, Disease, Peripheral Venous Disease, Disease, Peripheral Venous Disease, Type II Diabetes, Osteoarthritis, Type II Diabetes, Osteoarthritis, Type II Diabetes, Osteoarthritis, Neuropathy Neuropathy Neuropathy Date Acquired: 05/14/2022 05/14/2022 05/14/2022 Weeks of Treatment: 1 1 1  Wound Status: Open Open Open Wound Recurrence: No No No Measurements L x W x D (cm) 1x0.8x0.1 10x7x0.1 1.1x1x0.1 Area (cm) : 0.628 54.978 0.864 Volume (cm) : 0.063 5.498 0.086 % Reduction in Area: 58.40% -11.10% 69.40% % Reduction in Volume: 79.10% 44.40% 69.60% Classification: Full Thickness Without Exposed Full Thickness Without Exposed Full Thickness Without Exposed Support Structures Support Structures Support Structures Exudate Amount: Medium Medium Medium Exudate Type: Serosanguineous Serosanguineous Serosanguineous Exudate Color: red, brown red, brown red, brown Wound Margin: Indistinct, nonvisible Indistinct, nonvisible Indistinct, nonvisible Granulation Amount: Medium (34-66%) Medium (34-66%) Medium (34-66%) Granulation Quality: Pink Pink  N/A Necrotic Amount: Medium (34-66%) Medium (34-66%) Medium (34-66%) Exposed Structures: Fascia: No Fat Layer (Subcutaneous Tissue): Fat Layer (Subcutaneous Tissue): Fat Layer (Subcutaneous Tissue): Yes Yes No Fascia: No Fascia: No Tendon: No Tendon: No Tendon: No Muscle: No Muscle: No Muscle: No Joint: No Joint: No Joint: No Bone: No Bone: No Bone: No Epithelialization: None None None Treatment Notes Electronic Signature(s) RONELL, BOLDIN (Nicola Police) Signed: 08/03/2022 9:44:45 AM By: 08/05/2022 RN Entered By: Yevonne Pax on 08/02/2022 11:31:02 Kosch, 08/04/2022 ([name]) -------------------------------------------------------------------------------- Multi-Disciplinary Care Plan Details Patient Name: [id number] L. Date of Service: 08/02/2022 11:00 AM Medical Record Number: 08/04/2022 Patient Account Number: [id number] Date of Birth/Sex: 01/17/1941 (81 y.o. F) Treating RN: 09-08-1980 Primary Care Charlise Giovanetti: Yevonne Pax Other Clinician: Referring Janos Shampine: Barbette Reichmann Treating Buddie Marston/Extender: Gwyneth Revels in Treatment: 1 Active Inactive Necrotic Tissue Nursing Diagnoses: Impaired tissue integrity related to necrotic/devitalized tissue Knowledge deficit related to management of necrotic/devitalized tissue Goals: Necrotic/devitalized tissue will be minimized in the wound bed Date Initiated: 07/24/2022 Target Resolution Date: 07/24/2022 Goal Status: Active Patient/caregiver will verbalize understanding of reason and process for debridement of necrotic tissue Date Initiated: 07/24/2022 Target Resolution Date: 07/24/2022 Goal Status: Active Interventions: Assess patient pain level pre-, during and post procedure and prior to discharge Provide education on necrotic tissue and debridement process Treatment Activities: Excisional debridement : 07/24/2022 Notes: Orientation to the Wound Care Program Nursing Diagnoses: Knowledge deficit  related to the wound healing center program Goals: Patient/caregiver will verbalize understanding of the Wound Healing Center Program Date Initiated: 07/24/2022 Target Resolution Date: 07/24/2022 Goal Status: Active Interventions: Provide education on orientation to the wound center Notes: Soft Tissue Infection Nursing Diagnoses: Impaired tissue integrity Potential for infection: soft tissue Goals: Patient/caregiver will verbalize understanding of or measures to prevent infection and contamination in the home setting Date Initiated: 07/24/2022 Target Resolution Date: 07/24/2022 Goal Status: Active Patient's soft tissue infection will resolve Date Initiated: 07/24/2022 Target Resolution Date: 07/24/2022 Goal Status: Active Signs and symptoms of infection will be recognized early to allow for prompt treatment Date Initiated: 07/24/2022 Target Resolution Date: 07/24/2022 Goal Status: Active MADISSON, KULAGA (Nicola Police) Interventions: Assess signs and symptoms of infection every visit Treatment Activities: Culture and sensitivity : 07/24/2022 Systemic antibiotics : 07/24/2022 Notes: Venous Leg Ulcer Nursing Diagnoses: Actual venous Insuffiency (use after diagnosis is confirmed) Knowledge deficit related to disease process and management Potential for venous Insuffiency (use before diagnosis confirmed) Goals: Patient will maintain optimal edema control Date Initiated: 07/24/2022 Target Resolution Date: 07/24/2022 Goal Status: Active Patient/caregiver will verbalize  understanding of disease process and disease management Date Initiated: 07/24/2022 Target Resolution Date: 07/24/2022 Goal Status: Active Verify adequate tissue perfusion prior to therapeutic compression application Date Initiated: 07/24/2022 Target Resolution Date: 07/24/2022 Goal Status: Active Interventions: Assess peripheral edema status every visit. Treatment Activities: Non-invasive vascular studies :  07/24/2022 Notes: Wound/Skin Impairment Nursing Diagnoses: Impaired tissue integrity Knowledge deficit related to smoking impact on wound healing Knowledge deficit related to ulceration/compromised skin integrity Goals: Patient/caregiver will verbalize understanding of skin care regimen Date Initiated: 07/24/2022 Target Resolution Date: 07/24/2022 Goal Status: Active Ulcer/skin breakdown will have a volume reduction of 30% by week 4 Date Initiated: 07/24/2022 Target Resolution Date: 08/21/2022 Goal Status: Active Interventions: Provide education on ulcer and skin care Treatment Activities: Referred to DME Mustafa Potts for dressing supplies : 07/24/2022 Skin care regimen initiated : 07/24/2022 Topical wound management initiated : 07/24/2022 Notes: Electronic Signature(s) Signed: 08/03/2022 9:44:45 AM By: Carlene Coria RN Entered By: Carlene Coria on 08/02/2022 11:30:56 [place], [name] ([id number]) -------------------------------------------------------------------------------- Pain Assessment Details Patient Name: [name] Date of Service: 08/02/2022 11:00 AM Medical Record Number: [id number] Patient Account Number: [id number] Date of Birth/Sex: 08/16/1941 (81 y.o. F) Treating RN: Carlene Coria Primary Care Lewie Deman: Tracie Harrier Other Clinician: Referring Ilya Neely: Samara Deist Treating Gearline Spilman/Extender: Skipper Cliche in Treatment: 1 Active Problems Location of Pain Severity and Description of Pain Patient Has Paino No Site Locations Pain Management and Medication Current Pain Management: Electronic Signature(s) Signed: 08/03/2022 9:44:45 AM By: Carlene Coria RN Entered By: Carlene Coria on 08/02/2022 11:24:57 [place], [name] ([id number]) -------------------------------------------------------------------------------- Patient/Caregiver Education Details Patient Name: [name] Date of Service: 08/02/2022 11:00 AM Medical Record Number:  [id number] Patient Account Number: [id number] Date of Birth/Gender: 02-16-1941 (81 y.o. F) Treating RN: Carlene Coria Primary Care Physician: Tracie Harrier Other Clinician: Referring Physician: Samara Deist Treating Physician/Extender: Skipper Cliche in Treatment: 1 Education Assessment Education Provided To: Patient Education Topics Provided Welcome To [facility]: Methods: Explain/Verbal Responses: State content correctly Wound/Skin Impairment: Methods: Explain/Verbal Responses: State content correctly Electronic Signature(s) Signed: 08/03/2022 9:44:45 AM By: Carlene Coria RN Entered By: Carlene Coria on 08/02/2022 11:40:24 [place], [name] ([id number]) -------------------------------------------------------------------------------- Wound Assessment Details Patient Name: [name] Date of Service: 08/02/2022 11:00 AM Medical Record Number: [id number] Patient Account Number: [id number] Date of Birth/Sex: [birth year] (81 y.o. F) Treating RN: Carlene Coria Primary Care Santana Gosdin: Tracie Harrier Other Clinician: Referring Lurlean Kernen: Samara Deist Treating Karder Goodin/Extender: Jeri Cos Weeks in Treatment: 1 Wound Status Wound Number: 3 Primary Venous Leg Ulcer Etiology: Wound Location: Right, Lateral Lower Leg Secondary Diabetic Wound/Ulcer of the Lower Extremity Wounding Event: Gradually Appeared Etiology: Date Acquired: 05/14/2022 Wound Open Weeks Of Treatment: 1 Status: Clustered Wound: No Comorbid Cataracts, Lymphedema, Hypertension, Peripheral History: Arterial Disease, Peripheral Venous Disease, Type II Diabetes, Osteoarthritis, Neuropathy Photos Wound Measurements Length: (cm) 1 Width: (cm) 0.8 Depth: (cm) 0.1 Area: (cm) 0.628 Volume: (cm) 0.063 % Reduction in Area: 58.4% % Reduction in Volume: 79.1% Epithelialization: None Tunneling: No Undermining: No Wound Description Classification: Full Thickness Without Exposed Support  Structu Wound Margin: Indistinct, nonvisible Exudate Amount: Medium Exudate Type: Serosanguineous Exudate Color: red, brown res Foul Odor After Cleansing: No Slough/Fibrino Yes Wound Bed Granulation Amount: Medium (34-66%) Exposed Structure Granulation Quality: Pink Fascia Exposed: No Necrotic Amount: Medium (34-66%) Fat Layer (Subcutaneous Tissue) Exposed: No Necrotic Quality: Adherent Slough Tendon Exposed: No Muscle Exposed: No Joint Exposed: No Bone Exposed: No Treatment Notes Wound #3 (Lower Leg) Wound Laterality: Right, Lateral Cleanser Byram Ancillary Kit -  Hope ALIA, PARSLEY ([id number]) Discharge Instruction: Use supplies as instructed; Kit contains: (15) Saline Bullets; (15) 3x3 Gauze; 15 pr Gloves Peri-Wound Care Topical Primary Dressing Silvercel Small 2x2 (in/in) Discharge Instruction: Apply Silvercel Small 2x2 (in/in) as instructed Secondary Dressing ABD Pad 5x9 (in/in) Discharge Instruction: Cover with ABD pad Secured With Compression Wrap Compression Stockings Add-Ons Electronic Signature(s) Signed: 08/03/2022 9:44:45 AM By: Carlene Coria RN Entered By: Carlene Coria on 08/02/2022 11:29:02 [place], [name] ([id number]) -------------------------------------------------------------------------------- Wound Assessment Details Patient Name: [name] Date of Service: 08/02/2022 11:00 AM Medical Record Number: [id number] Patient Account Number: [id number] Date of Birth/Sex: May 26, 1941 (81 y.o. F) Treating RN: Carlene Coria Primary Care Dominion Kathan: Tracie Harrier Other Clinician: Referring Shaleena Crusoe: Samara Deist Treating Shonice Wrisley/Extender: Jeri Cos Weeks in Treatment: 1 Wound Status Wound Number: 4 Primary Venous Leg Ulcer Etiology: Wound Location: Left, Midline, Anterior Lower Leg Secondary Diabetic Wound/Ulcer of the Lower Extremity Wounding Event: Gradually Appeared Etiology: Date Acquired: 05/14/2022 Wound Open Weeks  Of Treatment: 1 Status: Clustered Wound: No Comorbid Cataracts, Lymphedema, Hypertension, Peripheral History: Arterial Disease, Peripheral Venous Disease, Type II Diabetes, Osteoarthritis, Neuropathy Photos Wound Measurements Length: (cm) 10 Width: (cm) 7 Depth: (cm) 0.1 Area: (cm) 54.978 Volume: (cm) 5.498 % Reduction in Area: -11.1% % Reduction in Volume: 44.4% Epithelialization: None Tunneling: No Undermining: No Wound Description Classification: Full Thickness Without Exposed Support Structu Wound Margin: Indistinct, nonvisible Exudate Amount: Medium Exudate Type: Serosanguineous Exudate Color: red, brown res Foul Odor After Cleansing: No Slough/Fibrino Yes Wound Bed Granulation Amount: Medium (34-66%) Exposed Structure Granulation Quality: Pink Fascia Exposed: No Necrotic Amount: Medium (34-66%) Fat Layer (Subcutaneous Tissue) Exposed: Yes Necrotic Quality: Adherent Slough Tendon Exposed: No Muscle Exposed: No Joint Exposed: No Bone Exposed: No Treatment Notes Wound #4 (Lower Leg) Wound Laterality: Left, Midline, Anterior Cleanser Byram Ancillary Kit - 15 Day Supply Swiatek, Olive L. ([id number]) Discharge Instruction: Use supplies as instructed; Kit contains: (15) Saline Bullets; (15) 3x3 Gauze; 15 pr Gloves Peri-Wound Care Topical Primary Dressing Silvercel 4 1/4x 4 1/4 (in/in) Discharge Instruction: Apply Silvercel 4 1/4x 4 1/4 (in/in) as instructed Secondary Dressing ABD Pad 5x9 (in/in) Discharge Instruction: Cover with ABD pad Secured With Tubigrip Size C, 2.75x10 (in/yd) Discharge Instruction: Apply 3 Tubigrip C 3-finger-widths below knee to base of toes to secure dressing and/or for swelling. Compression Wrap Compression Stockings Add-Ons Electronic Signature(s) Signed: 08/03/2022 9:44:45 AM By: Carlene Coria RN Entered By: Carlene Coria on 08/02/2022 11:29:25 [place], [name]  ([id number]) -------------------------------------------------------------------------------- Wound Assessment Details Patient Name: [name] Date of Service: 08/02/2022 11:00 AM Medical Record Number: [id number] Patient Account Number: [id number] Date of Birth/Sex: [birth year] (81 y.o. F) Treating RN: Carlene Coria Primary Care Elyshia Kumagai: Tracie Harrier Other Clinician: Referring Izabellah Dadisman: Samara Deist Treating Roald Lukacs/Extender: Jeri Cos Weeks in Treatment: 1 Wound Status Wound Number: 5 Primary Venous Leg Ulcer Etiology: Wound Location: Left, Posterior Lower Leg Secondary Diabetic Wound/Ulcer of the Lower Extremity Wounding Event: Gradually Appeared Etiology: Date Acquired: 05/14/2022 Wound Open Weeks Of Treatment: 1 Status: Clustered Wound: No Comorbid Cataracts, Lymphedema, Hypertension, Peripheral History: Arterial Disease, Peripheral Venous Disease, Type II Diabetes, Osteoarthritis, Neuropathy Photos Wound Measurements Length: (cm) 1.1 Width: (cm) 1 Depth: (cm) 0.1 Area: (cm) 0.864 Volume: (cm) 0.086 % Reduction in Area: 69.4% % Reduction in Volume: 69.6% Epithelialization: None Tunneling: No Undermining: No Wound Description Classification: Full Thickness Without Exposed Support Structu Wound Margin: Indistinct, nonvisible Exudate Amount: Medium Exudate Type: Serosanguineous Exudate Color: red, brown res Foul Odor After Cleansing: No Slough/Fibrino  No Wound Bed Granulation Amount: Medium (34-66%) Exposed Structure Necrotic Amount: Medium (34-66%) Fascia Exposed: No Necrotic Quality: Adherent Slough Fat Layer (Subcutaneous Tissue) Exposed: Yes Tendon Exposed: No Muscle Exposed: No Joint Exposed: No Bone Exposed: No Treatment Notes Wound #5 (Lower Leg) Wound Laterality: Left, Posterior Cleanser Byram Ancillary Kit - 15 Day Supply Tomczak, Kaliyah L. ([id number]) Discharge Instruction: Use supplies as instructed; Kit contains: (15)  Saline Bullets; (15) 3x3 Gauze; 15 pr Gloves Peri-Wound Care Topical Primary Dressing Silvercel Small 2x2 (in/in) Discharge Instruction: Apply Silvercel Small 2x2 (in/in) as instructed Secondary Dressing ABD Pad 5x9 (in/in) Discharge Instruction: Cover with ABD pad Secured With Tubigrip Size C, 2.75x10 (in/yd) Discharge Instruction: Apply 3 Tubigrip C 3-finger-widths below knee to base of toes to secure dressing and/or for swelling. Compression Wrap Compression Stockings Add-Ons Electronic Signature(s) Signed: 08/03/2022 9:44:45 AM By: Carlene Coria RN Entered By: Carlene Coria on 08/02/2022 11:29:48 [place], [name] ([id number]) -------------------------------------------------------------------------------- Vitals Details Patient Name: [name] Date of Service: 08/02/2022 11:00 AM Medical Record Number: [id number] Patient Account Number: [id number] Date of Birth/Sex: August 17, 1941 (81 y.o. F) Treating RN: Carlene Coria Primary Care Amery Minasyan: Tracie Harrier Other Clinician: Referring Heidie Krall: Samara Deist Treating Jane Broughton/Extender: Skipper Cliche in Treatment: 1 Vital Signs Time Taken: 11:24 Temperature (F): 98.2 Height (in): 59 Pulse (bpm): 79 Weight (lbs): 122 Respiratory Rate (breaths/min): 18 Body Mass Index (BMI): 24.6 Blood Pressure (mmHg): 125/71 Reference Range: 80 - 120 mg / dl Electronic Signature(s) Signed: 08/03/2022 9:44:45 AM By: Carlene Coria RN Entered By: Carlene Coria on 08/02/2022 11:24:51

## 2022-08-03 DIAGNOSIS — R197 Diarrhea, unspecified: Secondary | ICD-10-CM | POA: Diagnosis not present

## 2022-08-07 ENCOUNTER — Encounter: Payer: PPO | Admitting: Physician Assistant

## 2022-08-07 DIAGNOSIS — M5136 Other intervertebral disc degeneration, lumbar region: Secondary | ICD-10-CM | POA: Diagnosis not present

## 2022-08-07 DIAGNOSIS — L97812 Non-pressure chronic ulcer of other part of right lower leg with fat layer exposed: Secondary | ICD-10-CM | POA: Diagnosis not present

## 2022-08-07 DIAGNOSIS — L97822 Non-pressure chronic ulcer of other part of left lower leg with fat layer exposed: Secondary | ICD-10-CM | POA: Diagnosis not present

## 2022-08-07 DIAGNOSIS — M5030 Other cervical disc degeneration, unspecified cervical region: Secondary | ICD-10-CM | POA: Diagnosis not present

## 2022-08-07 DIAGNOSIS — M5416 Radiculopathy, lumbar region: Secondary | ICD-10-CM | POA: Diagnosis not present

## 2022-08-07 DIAGNOSIS — M48062 Spinal stenosis, lumbar region with neurogenic claudication: Secondary | ICD-10-CM | POA: Diagnosis not present

## 2022-08-07 DIAGNOSIS — Z79899 Other long term (current) drug therapy: Secondary | ICD-10-CM | POA: Diagnosis not present

## 2022-08-07 DIAGNOSIS — L97222 Non-pressure chronic ulcer of left calf with fat layer exposed: Secondary | ICD-10-CM | POA: Diagnosis not present

## 2022-08-07 DIAGNOSIS — I87333 Chronic venous hypertension (idiopathic) with ulcer and inflammation of bilateral lower extremity: Secondary | ICD-10-CM | POA: Diagnosis not present

## 2022-08-07 NOTE — Progress Notes (Addendum)
[name] ([id number]) Visit Report for 08/07/2022 Chief Complaint Document Details Patient Name: [name], [name]. Date of Service: 08/07/2022 8:15 AM Medical Record Number: [id number] Patient Account Number: [id number] Date of Birth/Sex: June 18, 1941 (81 y.o. F) Treating RN: Cornell Barman Primary Care Provider: Tracie Harrier Other Clinician: Massie Kluver Referring Provider: Tracie Harrier Treating Provider/Extender: Skipper Cliche in Treatment: 2 Information Obtained from: Patient Chief Complaint Bilateral LE Ulcers Electronic Signature(s) Signed: 08/07/2022 8:30:54 AM By: Worthy Keeler PA-C Entered By: Worthy Keeler on 08/07/2022 08:30:54 Minnie, [name] ([id number]) -------------------------------------------------------------------------------- HPI Details Patient Name: [name]. Date of Service: 08/07/2022 8:15 AM Medical Record Number: [id number] Patient Account Number: [id number] Date of Birth/Sex: 1941-06-23 (81 y.o. F) Treating RN: Cornell Barman Primary Care Provider: Tracie Harrier Other Clinician: Massie Kluver Referring Provider: Tracie Harrier Treating Provider/Extender: Skipper Cliche in Treatment: 2 History of Present Illness HPI Description: 81 year old patient who comes with a referral for bilateral lower extremity edema and a lower extremity ulceration and has been sent by her PCP Dr. Placido Sou. I understand the patient was recently put on amoxicillin and doxycycline but could not tolerate the amoxicillin. doxycycline course was completed. a BNP and EKG was supposed to be normal and the patient did not have any dyspnea. the patient has been on a diuretic. The patient was also prescribed a pair of elastic compression stockings of the 20-30 mmHg pressure variety. x-ray of the right ankle was done on 09/20/2015 and showed posttraumatic and postsurgical changes of the right ankle with secondary degenerative changes of the  tibiotalar joint and to a lesser degree the subtalar joint. No definite acute bony abnormalities are noted. Past medical history significant for diabetes mellitus, hypertension, hyperlipidemia, right breast cancer treated with a mastectomy in 2014. She has never smoked. 10/24/2015 -- she had delayed her vascular test because of her husband surgery but she is now ready to get him taken care of. He is also unable to use compression stockings and hence we will need to order her Juzo wraps. 10/31/2015-- was seen by Dr. Lucky Cowboy on 10/28/2015. She had a left lower extremity arterial duplex done at his office a couple of years ago and that was essentially normal. Today they performed a venous duplex which revealed no evidence of deep vein thrombosis, superficial thrombophlebitis, no venous reflex was seen on the right and a minimal amount of reflux was seen on the left great saphenous vein but no significant reflux was seen. Impression was that there was a component of lymphedema present from a previous surgery and he would recommend compression stockings and leg elevation. Readmission: 07-24-2022 upon evaluation today patient presents for initial inspection here in our clinic concerning issues that she has been having with her legs this is actually been going on for several years according to what her family member with her today tells me as well as what the patient reiterates as well. She is currently most recently been seeing Dr. Vickki Muff and subsequently he had her in [place] boots. However 2 weeks ago he referred her to [place] and then subsequently took her out of the [place] boot wraps at that point. At this time the left leg looks to be worse in the right leg currently. She is on Lasix and lisinopril with hydrochlorothiazide she has high blood pressure she also has issues currently with lower extremity swelling and edema which has been an ongoing issue for her as well. Patient does have a history of chronic venous  hypertension,  lymphedema, diabetes mellitus type 2, hypertension, peripheral vascular disease, and neuropathy. Currently she is on Lasix as well as lisinopril with hydrochlorothiazide. 08-02-2022 upon evaluation today patient presents for reevaluation the good news is she is actually doing somewhat better in regard to the wound and the overall appearance and sinuses. The unfortunate thing is her infection really is not significantly improved we did have to switch out her antibiotic once we got that final result back and I switched her to Levaquin and away from the doxycycline. Unfortunately the doxycycline had been doing poorly for her. In fact she had had diarrhea from the time she started taking it on Friday and she is still having it when she shows up today for evaluation. Again I was not aware of this and obviously she does appear to be somewhat dehydrated as well based on what I see. My concern which I discussed with the patient today is the possibility of a C. difficile infection. With that being said fact this started immediately upon taking the doxycycline makes me think that it was just the medicine and is not completely out of her system yet despite having taken the last dose Tuesday morning. Nonetheless with what we are seeing currently I want to be sure that reason I Minna contact her primary care provider and see if a would be willing to see her and test for C. difficile infection. 08-07-2022 upon evaluation today patient appears to be doing well currently in regard to her wounds which are actually measuring much better this is great news. Fortunately I do not see any signs of active infection locally or systemically at this time which is great as well. The good news is she was tested for a C. difficile infection and it was negative. I am very pleased and thankful for primary care provider for doing that so this means that she was just having a severe reaction to the doxycycline we have  added that to her allergy list at this point. Electronic Signature(s) Signed: 08/07/2022 8:50:06 AM By: Worthy Keeler PA-C Entered By: Worthy Keeler on 08/07/2022 08:50:06 Fillingim, [name] ([id number]) -------------------------------------------------------------------------------- Physical Exam Details Patient Name: [name] Date of Service: 08/07/2022 8:15 AM Medical Record Number: [id number] Patient Account Number: [id number] Date of Birth/Sex: 18-Apr-1941 (81 y.o. F) Treating RN: Cornell Barman Primary Care Provider: Tracie Harrier Other Clinician: Massie Kluver Referring Provider: Tracie Harrier Treating Provider/Extender: Skipper Cliche in Treatment: 2 Constitutional Well-nourished and well-hydrated in no acute distress. Respiratory normal breathing without difficulty. Psychiatric this patient is able to make decisions and demonstrates good insight into disease process. Alert and Oriented x 3. pleasant and cooperative. Notes Upon evaluation today patient's wounds are actually showing signs of significant improvement which is great news. The left leg is still little bit erythematous compared to the right but it does seem to be getting better. I am still little reluctant to put her on oral antibiotics currently she has the Levaquin but again she is still very dehydrated she just started eating a little bit yesterday and this is the first day she has not had diarrhea today. Electronic Signature(s) Signed: 08/07/2022 8:51:04 AM By: Worthy Keeler PA-C Previous Signature: 08/07/2022 8:50:26 AM Version By: Worthy Keeler PA-C Entered By: Worthy Keeler on 08/07/2022 08:51:04 Stickels, [name] ([id number]) -------------------------------------------------------------------------------- Physician Orders Details Patient Name: [name]. Date of Service: 08/07/2022 8:15 AM Medical Record Number: [id number] Patient Account Number: [id number] Date of  Birth/Sex: 02-Mar-1941 (81 y.o.  F) Treating RN: Cornell Barman Primary Care Provider: Tracie Harrier Other Clinician: Massie Kluver Referring Provider: Tracie Harrier Treating Provider/Extender: Skipper Cliche in Treatment: 2 Verbal / Phone Orders: No Diagnosis Coding ICD-10 Coding Code Description [phone] Chronic venous hypertension (idiopathic) with ulcer and inflammation of bilateral lower extremity I89.0 Lymphedema, not elsewhere classified L97.822 Non-pressure chronic ulcer of other part of left lower leg with fat layer exposed L97.812 Non-pressure chronic ulcer of other part of right lower leg with fat layer exposed E11.622 Type 2 diabetes mellitus with other skin ulcer I10 Essential (primary) hypertension I73.89 Other specified peripheral vascular diseases E11.43 Type 2 diabetes mellitus with diabetic autonomic (poly)neuropathy Follow-up Appointments Wound #3 Right,Lateral Lower Leg o Return Appointment in 1 week. o Nurse Visit as needed Wound #4 Left,Midline,Anterior Lower Leg o Return Appointment in 1 week. o Nurse Visit as needed Wound #5 Left,Posterior Lower Leg o Return Appointment in 1 week. o Nurse Visit as needed Bathing/ Shower/ Hygiene o May shower; gently cleanse wound with antibacterial soap, rinse and pat dry prior to dressing wounds Anesthetic (Use 'Patient Medications' Section for Anesthetic Order Entry) o Lidocaine applied to wound bed Edema Control - Lymphedema / Segmental Compressive Device / Other o Tubigrip single layer applied. - Size: C Medications-Please add to medication list. o Topical Antibiotic - gentamycin cream Wound Treatment Wound #3 - Lower Leg Wound Laterality: Right, Lateral Cleanser: Byram Ancillary Kit - 15 Day Supply 3 x Per Week/30 Days Discharge Instructions: Use supplies as instructed; Kit contains: (15) Saline Bullets; (15) 3x3 Gauze; 15 pr Gloves Topical: Gentamicin 3 x Per Week/30 Days Discharge  Instructions: Apply as directed by provider. Primary Dressing: Silvercel Small 2x2 (in/in) 3 x Per Week/30 Days Discharge Instructions: Apply Silvercel Small 2x2 (in/in) as instructed Secondary Dressing: ABD Pad 5x9 (in/in) (DME) (Generic) 3 x Per Week/30 Days Discharge Instructions: Cover with ABD pad Wound #4 - Lower Leg Wound Laterality: Left, Midline, Anterior Cleanser: Byram Ancillary Kit - 15 Day Supply 3 x Per Week/30 Days Wipperfurth, Marica L. ([id number]) Discharge Instructions: Use supplies as instructed; Kit contains: (15) Saline Bullets; (15) 3x3 Gauze; 15 pr Gloves Topical: Gentamicin 3 x Per Week/30 Days Discharge Instructions: Apply as directed by provider. Primary Dressing: Silvercel 4 1/4x 4 1/4 (in/in) (Generic) 3 x Per Week/30 Days Discharge Instructions: Apply Silvercel 4 1/4x 4 1/4 (in/in) as instructed Secondary Dressing: ABD Pad 5x9 (in/in) 3 x Per Week/30 Days Discharge Instructions: Cover with ABD pad Secured With: Tubigrip Size C, 2.75x10 (in/yd) 3 x Per Week/30 Days Discharge Instructions: Apply 3 Tubigrip C 3-finger-widths below knee to base of toes to secure dressing and/or for swelling. Wound #5 - Lower Leg Wound Laterality: Left, Posterior Cleanser: Byram Ancillary Kit - 15 Day Supply 3 x Per Week/30 Days Discharge Instructions: Use supplies as instructed; Kit contains: (15) Saline Bullets; (15) 3x3 Gauze; 15 pr Gloves Topical: Gentamicin 3 x Per Week/30 Days Discharge Instructions: Apply as directed by provider. Primary Dressing: Silvercel Small 2x2 (in/in) (DME) (Dispense As Written) 3 x Per Week/30 Days Discharge Instructions: Apply Silvercel Small 2x2 (in/in) as instructed Secondary Dressing: ABD Pad 5x9 (in/in) 3 x Per Week/30 Days Discharge Instructions: Cover with ABD pad Secured With: Tubigrip Size C, 2.75x10 (in/yd) 3 x Per Week/30 Days Discharge Instructions: Apply 3 Tubigrip C 3-finger-widths below knee to base of toes to secure dressing and/or for  swelling. Patient Medications Allergies: doxycycline, Sulfa (Sulfonamide Antibiotics) Notifications Medication Indication Start End gentamicin 08/07/2022 DOSE topical 0.1 % cream - cream  topical daily applied to the wound beds on each leg then apply dressings as directed x 30 days Electronic Signature(s) Signed: 08/07/2022 8:54:43 AM By: Worthy Keeler PA-C Entered By: Worthy Keeler on 08/07/2022 08:54:43 [place], [name] ([id number]) -------------------------------------------------------------------------------- Problem List Details Patient Name: [name] Date of Service: 08/07/2022 8:15 AM Medical Record Number: [id number] Patient Account Number: [id number] Date of Birth/Sex: 06-Aug-1941 (81 y.o. F) Treating RN: Cornell Barman Primary Care Provider: Tracie Harrier Other Clinician: Massie Kluver Referring Provider: Tracie Harrier Treating Provider/Extender: Skipper Cliche in Treatment: 2 Active Problems ICD-10 Encounter Code Description Active Date MDM Diagnosis I87.333 Chronic venous hypertension (idiopathic) with ulcer and inflammation of 07/24/2022 No Yes bilateral lower extremity I89.0 Lymphedema, not elsewhere classified 07/24/2022 No Yes L97.822 Non-pressure chronic ulcer of other part of left lower leg with fat layer 07/24/2022 No Yes exposed L97.812 Non-pressure chronic ulcer of other part of right lower leg with fat layer 07/24/2022 No Yes exposed E11.622 Type 2 diabetes mellitus with other skin ulcer 07/24/2022 No Yes I10 Essential (primary) hypertension 07/24/2022 No Yes I73.89 Other specified peripheral vascular diseases 07/24/2022 No Yes E11.43 Type 2 diabetes mellitus with diabetic autonomic (poly)neuropathy 07/24/2022 No Yes Inactive Problems Resolved Problems Electronic Signature(s) Signed: 08/07/2022 8:21:19 AM By: Worthy Keeler PA-C Entered By: Worthy Keeler on 08/07/2022 08:21:18 Devoss, [name]  ([id number]) -------------------------------------------------------------------------------- Progress Note Details Patient Name: [name] Date of Service: 08/07/2022 8:15 AM Medical Record Number: [id number] Patient Account Number: [id number] Date of Birth/Sex: [birth year] (81 y.o. F) Treating RN: Cornell Barman Primary Care Provider: Tracie Harrier Other Clinician: Massie Kluver Referring Provider: Tracie Harrier Treating Provider/Extender: Skipper Cliche in Treatment: 2 Subjective Chief Complaint Information obtained from Patient Bilateral LE Ulcers History of Present Illness (HPI) 81 year old patient who comes with a referral for bilateral lower extremity edema and a lower extremity ulceration and has been sent by her PCP Dr. Placido Sou. I understand the patient was recently put on amoxicillin and doxycycline but could not tolerate the amoxicillin. doxycycline course was completed. a BNP and EKG was supposed to be normal and the patient did not have any dyspnea. the patient has been on a diuretic. The patient was also prescribed a pair of elastic compression stockings of the 20-30 mmHg pressure variety. x-ray of the right ankle was done on 09/20/2015 and showed posttraumatic and postsurgical changes of the right ankle with secondary degenerative changes of the tibiotalar joint and to a lesser degree the subtalar joint. No definite acute bony abnormalities are noted. Past medical history significant for diabetes mellitus, hypertension, hyperlipidemia, right breast cancer treated with a mastectomy in 2014. She has never smoked. 10/24/2015 -- she had delayed her vascular test because of her husband surgery but she is now ready to get him taken care of. He is also unable to use compression stockings and hence we will need to order her Juzo wraps. 10/31/2015-- was seen by Dr. Lucky Cowboy on 10/28/2015. She had a left lower extremity arterial duplex done at his office a couple of years  ago and that was essentially normal. Today they performed a venous duplex which revealed no evidence of deep vein thrombosis, superficial thrombophlebitis, no venous reflex was seen on the right and a minimal amount of reflux was seen on the left great saphenous vein but no significant reflux was seen. Impression was that there was a component of lymphedema present from a previous surgery and he would recommend compression stockings and leg elevation. Readmission: 07-24-2022 upon  evaluation today patient presents for initial inspection here in our clinic concerning issues that she has been having with her legs this is actually been going on for several years according to what her family member with her today tells me as well as what the patient reiterates as well. She is currently most recently been seeing Dr. Vickki Muff and subsequently he had her in [place] boots. However 2 weeks ago he referred her to [place] and then subsequently took her out of the [employer] boot wraps at that point. At this time the left leg looks to be worse in the right leg currently. She is on Lasix and lisinopril with hydrochlorothiazide she has high blood pressure she also has issues currently with lower extremity swelling and edema which has been an ongoing issue for her as well. Patient does have a history of chronic venous hypertension, lymphedema, diabetes mellitus type 2, hypertension, peripheral vascular disease, and neuropathy. Currently she is on Lasix as well as lisinopril with hydrochlorothiazide. 08-02-2022 upon evaluation today patient presents for reevaluation the good news is she is actually doing somewhat better in regard to the wound and the overall appearance and sinuses. The unfortunate thing is her infection really is not significantly improved we did have to switch out her antibiotic once we got that final result back and I switched her to Levaquin and away from the doxycycline. Unfortunately the doxycycline had been  doing poorly for her. In fact she had had diarrhea from the time she started taking it on Friday and she is still having it when she shows up today for evaluation. Again I was not aware of this and obviously she does appear to be somewhat dehydrated as well based on what I see. My concern which I discussed with the patient today is the possibility of a C. difficile infection. With that being said fact this started immediately upon taking the doxycycline makes me think that it was just the medicine and is not completely out of her system yet despite having taken the last dose Tuesday morning. Nonetheless with what we are seeing currently I want to be sure that reason I Minna contact her primary care provider and see if a would be willing to see her and test for C. difficile infection. 08-07-2022 upon evaluation today patient appears to be doing well currently in regard to her wounds which are actually measuring much better this is great news. Fortunately I do not see any signs of active infection locally or systemically at this time which is great as well. The good news is she was tested for a C. difficile infection and it was negative. I am very pleased and thankful for primary care provider for doing that so this means that she was just having a severe reaction to the doxycycline we have added that to her allergy list at this point. Allergies Sulfa (Sulfonamide Antibiotics), doxycycline (Severity: Severe, Reaction: severe diarrhea) Objective Daughdrill, Palmina L. ([id number]) Constitutional Well-nourished and well-hydrated in no acute distress. Vitals Time Taken: 8:16 AM, Height: 59 in, Weight: 122 lbs, BMI: 24.6, Temperature: 98.2 F, Pulse: 83 bpm, Respiratory Rate: 16 breaths/min, Blood Pressure: 135/73 mmHg. Respiratory normal breathing without difficulty. Psychiatric this patient is able to make decisions and demonstrates good insight into disease process. Alert and Oriented x 3. pleasant  and cooperative. General Notes: Upon evaluation today patient's wounds are actually showing signs of significant improvement which is great news. The left leg is still little bit erythematous compared to the right but  it does seem to be getting better. I am still little reluctant to put her on oral antibiotics currently she has the Levaquin but again she is still very dehydrated she just started eating a little bit yesterday and this is the first day she has not had diarrhea today. Integumentary (Hair, Skin) Wound #3 status is Open. Original cause of wound was Gradually Appeared. The date acquired was: 05/14/2022. The wound has been in treatment 2 weeks. The wound is located on the Right,Lateral Lower Leg. The wound measures 1.5cm length x 0.9cm width x 0.1cm depth; 1.06cm^2 area and 0.106cm^3 volume. There is a medium amount of serosanguineous drainage noted. The wound margin is indistinct and nonvisible. There is medium (34-66%) pink granulation within the wound bed. There is a medium (34-66%) amount of necrotic tissue within the wound bed including Adherent Slough. Wound #4 status is Open. Original cause of wound was Gradually Appeared. The date acquired was: 05/14/2022. The wound has been in treatment 2 weeks. The wound is located on the Left,Midline,Anterior Lower Leg. The wound measures 8.5cm length x 3.2cm width x 0.1cm depth; 21.363cm^2 area and 2.136cm^3 volume. There is Fat Layer (Subcutaneous Tissue) exposed. There is a medium amount of serosanguineous drainage noted. The wound margin is indistinct and nonvisible. There is medium (34-66%) pink granulation within the wound bed. There is a medium (34-66%) amount of necrotic tissue within the wound bed including Adherent Slough. Wound #5 status is Open. Original cause of wound was Gradually Appeared. The date acquired was: 05/14/2022. The wound has been in treatment 2 weeks. The wound is located on the Left,Posterior Lower Leg. The wound measures  0.7cm length x 0.6cm width x 0.1cm depth; 0.33cm^2 area and 0.033cm^3 volume. There is Fat Layer (Subcutaneous Tissue) exposed. There is a medium amount of serosanguineous drainage noted. The wound margin is indistinct and nonvisible. There is medium (34-66%) granulation within the wound bed. There is a medium (34-66%) amount of necrotic tissue within the wound bed including Adherent Slough. Assessment Active Problems ICD-10 Chronic venous hypertension (idiopathic) with ulcer and inflammation of bilateral lower extremity Lymphedema, not elsewhere classified Non-pressure chronic ulcer of other part of left lower leg with fat layer exposed Non-pressure chronic ulcer of other part of right lower leg with fat layer exposed Type 2 diabetes mellitus with other skin ulcer Essential (primary) hypertension Other specified peripheral vascular diseases Type 2 diabetes mellitus with diabetic autonomic (poly)neuropathy Plan Follow-up Appointments: Wound #3 Right,Lateral Lower Leg: Return Appointment in 1 week. Nurse Visit as needed Wound #4 Left,Midline,Anterior Lower Leg: Return Appointment in 1 week. Nurse Visit as needed Wound #5 Left,Posterior Lower Leg: Return Appointment in 1 week. Nurse Visit as needed Bathing/ Shower/ Hygiene: May shower; gently cleanse wound with antibacterial soap, rinse and pat dry prior to dressing wounds Anesthetic (Use 'Patient Medications' Section for Anesthetic Order Entry): Lidocaine applied to wound bed Giuliani, Chaylee L. ([id number]) Edema Control - Lymphedema / Segmental Compressive Device / Other: Tubigrip single layer applied. - Size: C Medications-Please add to medication list.: Topical Antibiotic - gentamycin cream The following medication(s) was prescribed: gentamicin topical 0.1 % cream cream topical daily applied to the wound beds on each leg then apply dressings as directed x 30 days starting 08/07/2022 WOUND #3: - Lower Leg Wound Laterality:  Right, Lateral Cleanser: Byram Ancillary Kit - 15 Day Supply 3 x Per Week/30 Days Discharge Instructions: Use supplies as instructed; Kit contains: (15) Saline Bullets; (15) 3x3 Gauze; 15 pr Gloves Topical: Gentamicin 3 x Per  Week/30 Days Discharge Instructions: Apply as directed by provider. Primary Dressing: Silvercel Small 2x2 (in/in) 3 x Per Week/30 Days Discharge Instructions: Apply Silvercel Small 2x2 (in/in) as instructed Secondary Dressing: ABD Pad 5x9 (in/in) (DME) (Generic) 3 x Per Week/30 Days Discharge Instructions: Cover with ABD pad WOUND #4: - Lower Leg Wound Laterality: Left, Midline, Anterior Cleanser: Byram Ancillary Kit - 15 Day Supply 3 x Per Week/30 Days Discharge Instructions: Use supplies as instructed; Kit contains: (15) Saline Bullets; (15) 3x3 Gauze; 15 pr Gloves Topical: Gentamicin 3 x Per Week/30 Days Discharge Instructions: Apply as directed by provider. Primary Dressing: Silvercel 4 1/4x 4 1/4 (in/in) (Generic) 3 x Per Week/30 Days Discharge Instructions: Apply Silvercel 4 1/4x 4 1/4 (in/in) as instructed Secondary Dressing: ABD Pad 5x9 (in/in) 3 x Per Week/30 Days Discharge Instructions: Cover with ABD pad Secured With: Tubigrip Size C, 2.75x10 (in/yd) 3 x Per Week/30 Days Discharge Instructions: Apply 3 Tubigrip C 3-finger-widths below knee to base of toes to secure dressing and/or for swelling. WOUND #5: - Lower Leg Wound Laterality: Left, Posterior Cleanser: [employer] Ancillary Kit - 15 Day Supply 3 x Per Week/30 Days Discharge Instructions: Use supplies as instructed; Kit contains: (15) Saline Bullets; (15) 3x3 Gauze; 15 pr Gloves Topical: Gentamicin 3 x Per Week/30 Days Discharge Instructions: Apply as directed by provider. Primary Dressing: Silvercel Small 2x2 (in/in) (DME) (Dispense As Written) 3 x Per Week/30 Days Discharge Instructions: Apply Silvercel Small 2x2 (in/in) as instructed Secondary Dressing: ABD Pad 5x9 (in/in) 3 x Per Week/30  Days Discharge Instructions: Cover with ABD pad Secured With: Tubigrip Size C, 2.75x10 (in/yd) 3 x Per Week/30 Days Discharge Instructions: Apply 3 Tubigrip C 3-finger-widths below knee to base of toes to secure dressing and/or for swelling. 1. Based on what I am seeing I do believe that the patient would benefit from continuation of the silver cell although I am going to go ahead and send in a prescription for gentamicin cream as well to be applied to the wounds before applying the silver cell. I would like for her to change this daily so that she can reapply the cream once a day. 2. I am also can recommend that we have the patient continue with the ABD pad and roll gauze to secure in place followed by Tubigrip as well. 3. I would also suggest that the patient continue to monitor for any signs of worsening or infection. If anything changes she should contact the office and let me know. I am hopeful she will continue to show signs of improvement as we go. We will see patient back for reevaluation in 1 week here in the clinic. If anything worsens or changes patient will contact our office for additional recommendations. Electronic Signature(s) Signed: 08/07/2022 8:54:50 AM By: Worthy Keeler PA-C Entered By: Worthy Keeler on 08/07/2022 08:54:50 Fas[place]acht, [name] ([id number]) -------------------------------------------------------------------------------- SuperBill Details Patient Name: [name]. Date of Service: 08/07/2022 Medical Record Number: [id number] Patient Account Number: [id number] Date of Birth/Sex: [birth year] (81 y.o. 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 E11.622 Type  2 diabetes mellitus with other skin ulcer I10 Essential (primary) hypertension I73.89 Other specified peripheral vascular diseases E11.43 Type 2 diabetes mellitus with diabetic autonomic (poly)neuropathy Facility Procedures CPT4 Code: 23702301 Description: 99214 - WOUND CARE VISIT-LEV 4 EST PT Modifier: Quantity: 1 Physician Procedures CPT4 Code Description: [id number] 99214 - WC PHYS LEVEL 4 - EST PT Modifier: Quantity: 1 CPT4 Code Description: ICD-10 Diagnosis Description I87.333 Chronic venous hypertension (idiopathic) with ulcer and inflammation of I89.0 Lymphedema, not elsewhere classified L97.822 Non-pressure chronic ulcer of other part of left lower leg with fat  laye L97.812 Non-pressure chronic ulcer of other part of right lower leg with fat lay Modifier: bilateral lower extre r exposed er exposed Quantity: Art gallery manager Signature(s) Signed: 08/07/2022 8:55:01 AM By: Worthy Keeler PA-C Entered By: Worthy Keeler on 08/07/2022 08:55:00

## 2022-08-08 DIAGNOSIS — L97519 Non-pressure chronic ulcer of other part of right foot with unspecified severity: Secondary | ICD-10-CM | POA: Diagnosis not present

## 2022-08-10 NOTE — Progress Notes (Signed)
[name] ([id number]) Visit Report for 08/07/2022 Allergy List Details Patient Name: [name], [name]. Date of Service: 08/07/2022 8:15 AM Medical Record Number: [id number] Patient Account Number: [id number] Date of Birth/Sex: 23-Feb-1941 (81 y.o. F) Treating RN: [name] Primary Care [name]: [name] Other Clinician: Massie [name] Referring [name]: [name] Treating [name]/Extender: [name] Weeks in Treatment: 2 Allergies Active Allergies Sulfa (Sulfonamide Antibiotics) doxycycline Reaction: severe diarrhea Severity: Severe Active: 08/02/2022 Allergy Notes Electronic Signature(s) Signed: 08/10/2022 12:10:36 PM By: [name] Entered By: [name] on 08/07/2022 08:35:52 Shindler, [name] ([id number]) -------------------------------------------------------------------------------- Arrival Information Details Patient Name: [name] Ramus L. Date of Service: 08/07/2022 8:15 AM Medical Record Number: [id number] Patient Account Number: [id number] Date of Birth/Sex: May 03, 1941 (81 y.o. F) Treating RN: [name] Primary Care [name]: [name] Other Clinician: Massie [name] Referring [name]: [name] Treating [name]/Extender: [name] in Treatment: 2 Visit Information Patient Arrived: Wheel Chair Arrival Time: 08:15 Transfer Assistance: EasyPivot Patient Lift Patient Has Alerts: Yes Patient Alerts: Type II Diabetic History Since Last Visit All ordered tests and consults were completed: No Added or deleted any medications: No Any new allergies or adverse reactions: No Had a fall or experienced change in activities of daily living that may affect risk of falls: No Hospitalized since last visit: No Electronic Signature(s) Signed: 08/10/2022 12:10:36 PM By: [name] Entered By: [name] on 08/07/2022 08:16:15 Dulany, [name]  ([id number]) -------------------------------------------------------------------------------- Clinic Level of Care Assessment Details Patient Name: [name] Nims. Date of Service: 08/07/2022 8:15 AM Medical Record Number: [id number] Patient Account Number: [id number] Date of Birth/Sex: 12-28-1940 (81 y.o. F) Treating RN: [name] Primary Care Torrence Branagan: [name] Other Clinician: Massie [name] Referring [name]: [name] Treating [name]/Extender: [name] in Treatment: 2 Clinic Level of Care Assessment Items TOOL 4 Quantity Score _0  - Use when only an EandM is performed on FOLLOW-UP visit 0 ASSESSMENTS - Nursing Assessment / Reassessment X - Reassessment of Co-morbidities (includes updates in patient status) 1 10 X- 1 5 Reassessment of Adherence to Treatment Plan ASSESSMENTS - Wound and Skin Assessment / Reassessment _1  - Simple Wound Assessment / Reassessment - one wound 0 X- 3 5 Complex Wound Assessment / Reassessment - multiple wounds _2  - 0 Dermatologic / Skin Assessment (not related to wound area) ASSESSMENTS - Focused Assessment X - Circumferential Edema Measurements - multi extremities 2 5 _3  - 0 Nutritional Assessment / Counseling / Intervention _4  - 0 Lower Extremity Assessment (monofilament, tuning fork, pulses) _5  - 0 Peripheral Arterial Disease Assessment (using hand held doppler) ASSESSMENTS - Ostomy and/or Continence Assessment and Care _6  - Incontinence Assessment and Management 0 _7  - 0 Ostomy Care Assessment and Management (repouching, etc.) PROCESS - Coordination of Care X - Simple Patient / Family Education for ongoing care 1 15 _8  - 0 Complex (extensive) Patient / Family Education for ongoing care _9  - 0 Staff obtains Programmer, systems, Records, Test Results / Process Orders _10  - 0 Staff telephones HHA, Nursing Homes / Clarify orders / etc _11  - 0 Routine Transfer to another Facility (non-emergent condition) _12  - 0 Routine  Hospital Admission (non-emergent condition) _13  - 0 New Admissions / Biomedical engineer / Ordering NPWT, Apligraf, etc. _14  - 0 Emergency Hospital Admission (emergent condition) X- 1 10 Simple Discharge Coordination _15  - 0 Complex (extensive) Discharge Coordination PROCESS - Special Needs _16  - Pediatric / Minor Patient Management 0 _17  - 0 Isolation Patient Management _18  - 0 Hearing / Language / Visual special needs _19  -  0 Assessment of Community assistance (transportation, D/C planning, etc.) _0  - 0 Additional assistance / Altered mentation _1  - 0 Support Surface(s) Assessment (bed, cushion, seat, etc.) INTERVENTIONS - Wound Cleansing / Measurement Nachreiner, [name] ([id number]) _2  - 0 Simple Wound Cleansing - one wound X- 3 5 Complex Wound Cleansing - multiple wounds X- 1 5 Wound Imaging (photographs - any number of wounds) _3  - 0 Wound Tracing (instead of photographs) _4  - 0 Simple Wound Measurement - one wound X- 3 5 Complex Wound Measurement - multiple wounds INTERVENTIONS - Wound Dressings _5  - Small Wound Dressing one or multiple wounds 0 X- 3 15 Medium Wound Dressing one or multiple wounds _6  - 0 Large Wound Dressing one or multiple wounds X- 1 5 Application of Medications - topical <[id number]>_3<\/[id number]>_9  - 0 Application of Medications - injection INTERVENTIONS - Miscellaneous _8  - External ear exam 0 _9  - 0 Specimen Collection (cultures, biopsies, blood, body fluids, etc.) _10  - 0 Specimen(s) / Culture(s) sent or taken to Lab for analysis _11  - 0 Patient Transfer (multiple staff / Civil Service fast streamer / Similar devices) _12  - 0 Simple Staple / Suture removal (25 or less) _13  - 0 Complex Staple / Suture removal (26 or more) _14  - 0 Hypo / Hyperglycemic Management (close monitor of Blood Glucose) _15  - 0 Ankle / Brachial Index (ABI) - do not check if billed separately X- 1 5 Vital Signs Has the patient been seen at the hospital within the last three years: Yes Total Score:  155 Level Of Care: New/Established - Level 4 Electronic Signature(s) Signed: 08/10/2022 12:10:36 PM By: [name] Entered By: [name] on 08/07/2022 08:44:35 Capano, [name] ([id number]) -------------------------------------------------------------------------------- Encounter Discharge Information Details Patient Name: [name] Ramus L. Date of Service: 08/07/2022 8:15 AM Medical Record Number: [id number] Patient Account Number: [id number] Date of Birth/Sex: 1941-03-17 (81 y.o. F) Treating RN: [name] Primary Care Holton Sidman: [name] Other Clinician: Massie [name] Referring Latrell Reitan: [name] Treating Carston Riedl/Extender: [name] in Treatment: 2 Encounter Discharge Information Items Discharge Condition: Stable Ambulatory Status: Wheelchair Discharge Destination: Home Transportation: Private Auto Accompanied By: husband Schedule Follow-up Appointment: Yes Clinical Summary of Care: Electronic Signature(s) Signed: 08/10/2022 12:10:36 PM By: [name] Entered By: [name] on 08/07/2022 08:57:23 Dondero, [name] ([id number]) -------------------------------------------------------------------------------- Lower Extremity Assessment Details Patient Name: [name] Ramus L. Date of Service: 08/07/2022 8:15 AM Medical Record Number: [id number] Patient Account Number: [id number] Date of Birth/Sex: 1941/09/26 (81 y.o. F) Treating RN: [name] Primary Care Barnes Florek: [name] Other Clinician: Massie [name] Referring Jd Mccaster: [name] Treating Ayvah Caroll/Extender: [name] in Treatment: 2 Edema Assessment Assessed: [Left: Yes] [Right: Yes] Edema: [Left: Yes] [Right: Yes] Calf Left: Right: Point of Measurement: 30 cm From Medial Instep 29.1 cm 28.7 cm Ankle Left: Right: Point of Measurement: 10 cm From Medial Instep 21.6 cm 20.2 cm Vascular Assessment Pulses: Dorsalis Pedis Palpable:  [Left:Yes] [Right:Yes] Electronic Signature(s) Signed: 08/07/2022 10:42:44 AM By: Gretta Cool, BSN, RN, CWS, Kim RN, BSN Signed: 08/10/2022 12:10:36 PM By: [name] Entered By: [name] on 08/07/2022 08:28:59 Mash, Ayrabella L. ([id number]) -------------------------------------------------------------------------------- Multi Wound Chart Details Patient Name: [name] Ramus L. Date of Service: 08/07/2022 8:15 AM Medical Record Number: [id number] Patient Account Number: [id number] Date of Birth/Sex: Feb 10, 1941 (81 y.o. F) Treating RN: [name] Primary Care Hamp Moreland: [name] Other Clinician: Massie [name] Referring Shirrell Solinger: [name] Treating Glender Augusta/Extender: [name] in Treatment: 2 Vital Signs Height(in): 59 Pulse(bpm): 83 Weight(lbs): 122 Blood Pressure(mmHg): 135/73 Body Mass Index(BMI): 24.6  Temperature(F): 98.2 Respiratory Rate(breaths/min): 16 Photos: Wound Location: Right, Lateral Lower Leg Left, Midline, Anterior Lower Leg Left, Posterior Lower Leg Wounding Event: Gradually Appeared Gradually Appeared Gradually Appeared Primary Etiology: Venous Leg Ulcer Venous Leg Ulcer Venous Leg Ulcer Secondary Etiology: Diabetic Wound/Ulcer of the Lower Diabetic Wound/Ulcer of the Lower Diabetic Wound/Ulcer of the Lower Extremity Extremity Extremity Comorbid History: Cataracts, Lymphedema, Cataracts, Lymphedema, Cataracts, Lymphedema, Hypertension, Peripheral Arterial Hypertension, Peripheral Arterial Hypertension, Peripheral Arterial Disease, Peripheral Venous Disease, Disease, Peripheral Venous Disease, Disease, Peripheral Venous Disease, Type II Diabetes, Osteoarthritis, Type II Diabetes, Osteoarthritis, Type II Diabetes, Osteoarthritis, Neuropathy Neuropathy Neuropathy Date Acquired: 05/14/2022 05/14/2022 05/14/2022 Weeks of Treatment: _0 Wound Status: Open Open Open Wound Recurrence: No No No Measurements L x W x D (cm) 1.5x0.9x0.1  8.5x3.2x0.1 0.7x0.6x0.1 Area (cm) : 1.06 21.363 0.33 Volume (cm) : 0.106 2.136 0.033 % Reduction in Area: 29.70% 56.80% 88.30% % Reduction in Volume: 64.90% 78.40% 88.30% Classification: Full Thickness Without Exposed Full Thickness Without Exposed Full Thickness Without Exposed Support Structures Support Structures Support Structures Exudate Amount: Medium Medium Medium Exudate Type: Serosanguineous Serosanguineous Serosanguineous Exudate Color: red, brown red, brown red, brown Wound Margin: Indistinct, nonvisible Indistinct, nonvisible Indistinct, nonvisible Granulation Amount: Medium (34-66%) Medium (34-66%) Medium (34-66%) Granulation Quality: Pink Pink N/A Necrotic Amount: Medium (34-66%) Medium (34-66%) Medium (34-66%) Exposed Structures: Fascia: No Fat Layer (Subcutaneous Tissue): Fat Layer (Subcutaneous Tissue): Fat Layer (Subcutaneous Tissue): Yes Yes No Fascia: No Fascia: No Tendon: No Tendon: No Tendon: No Muscle: No Muscle: No Muscle: No Joint: No Joint: No Joint: No Bone: No Bone: No Bone: No Epithelialization: None None None Treatment Notes Electronic Signature(s) SHAMIYAH, NGU ([id number]) Signed: 08/10/2022 12:10:36 PM By: [name] Entered By: [name] on 08/07/2022 08:29:20 Lamboy, [name] ([id number]) -------------------------------------------------------------------------------- Multi-Disciplinary Care Plan Details Patient Name: [name] Nims. Date of Service: 08/07/2022 8:15 AM Medical Record Number: [id number] Patient Account Number: [id number] Date of Birth/Sex: 12-21-1940 (81 y.o. F) Treating RN: [name] Primary Care Breannah Kratt: [name] Other Clinician: Massie [name] Referring Marenda Accardi: [name] Treating Yecenia Dalgleish/Extender: [name] in Treatment: 2 Active Inactive Necrotic Tissue Nursing Diagnoses: Impaired tissue integrity related to necrotic/devitalized tissue Knowledge deficit  related to management of necrotic/devitalized tissue Goals: Necrotic/devitalized tissue will be minimized in the wound bed Date Initiated: 07/24/2022 Target Resolution Date: 07/24/2022 Goal Status: Active Patient/caregiver will verbalize understanding of reason and process for debridement of necrotic tissue Date Initiated: 07/24/2022 Target Resolution Date: 07/24/2022 Goal Status: Active Interventions: Assess patient pain level pre-, during and post procedure and prior to discharge Provide education on necrotic tissue and debridement process Treatment Activities: Excisional debridement : 07/24/2022 Notes: Orientation to the Wound Care Program Nursing Diagnoses: Knowledge deficit related to the wound healing center program Goals: Patient/caregiver will verbalize understanding of the [place] Program Date Initiated: 07/24/2022 Target Resolution Date: 07/24/2022 Goal Status: Active Interventions: Provide education on orientation to the wound center Notes: Soft Tissue Infection Nursing Diagnoses: Impaired tissue integrity Potential for infection: soft tissue Goals: Patient/caregiver will verbalize understanding of or measures to prevent infection and contamination in the home setting Date Initiated: 07/24/2022 Target Resolution Date: 07/24/2022 Goal Status: Active Patient's soft tissue infection will resolve Date Initiated: 07/24/2022 Target Resolution Date: 07/24/2022 Goal Status: Active Signs and symptoms of infection will be recognized early to allow for prompt treatment Date Initiated: 07/24/2022 Target Resolution Date: 07/24/2022 Goal Status: Active BLAYRE, PAPANIA. ([id number]) Interventions: Assess signs and symptoms of infection every visit Treatment Activities: Culture and  sensitivity : 07/24/2022 Systemic antibiotics : 07/24/2022 Notes: Venous Leg Ulcer Nursing Diagnoses: Actual venous Insuffiency (use after diagnosis is confirmed) Knowledge deficit related  to disease process and management Potential for venous Insuffiency (use before diagnosis confirmed) Goals: Patient will maintain optimal edema control Date Initiated: 07/24/2022 Target Resolution Date: 07/24/2022 Goal Status: Active Patient/caregiver will verbalize understanding of disease process and disease management Date Initiated: 07/24/2022 Target Resolution Date: 07/24/2022 Goal Status: Active Verify adequate tissue perfusion prior to therapeutic compression application Date Initiated: 07/24/2022 Target Resolution Date: 07/24/2022 Goal Status: Active Interventions: Assess peripheral edema status every visit. Treatment Activities: Non-invasive vascular studies : 07/24/2022 Notes: Wound/Skin Impairment Nursing Diagnoses: Impaired tissue integrity Knowledge deficit related to smoking impact on wound healing Knowledge deficit related to ulceration/compromised skin integrity Goals: Patient/caregiver will verbalize understanding of skin care regimen Date Initiated: 07/24/2022 Target Resolution Date: 07/24/2022 Goal Status: Active Ulcer/skin breakdown will have a volume reduction of 30% by week 4 Date Initiated: 07/24/2022 Target Resolution Date: 08/21/2022 Goal Status: Active Interventions: Provide education on ulcer and skin care Treatment Activities: Referred to DME Suezette Lafave for dressing supplies : 07/24/2022 Skin care regimen initiated : 07/24/2022 Topical wound management initiated : 07/24/2022 Notes: Electronic Signature(s) Signed: 08/07/2022 10:42:44 AM By: Gretta Cool, BSN, RN, CWS, Kim RN, BSN Signed: 08/10/2022 12:10:36 PM By: [name] Entered By: [name] on 08/07/2022 08:29:10 Carrigg, Breigh L. ([id number]) -------------------------------------------------------------------------------- Pain Assessment Details Patient Name: [name] Ramus L. Date of Service: 08/07/2022 8:15 AM Medical Record Number: [id number] Patient Account Number: [id number] Date of  Birth/Sex: July 27, 1941 (81 y.o. F) Treating RN: [name] Primary Care Jonni Oelkers: [name] Other Clinician: Massie [name] Referring Shia Delaine: [name] Treating Reedy Biernat/Extender: [name] in Treatment: 2 Active Problems Location of Pain Severity and Description of Pain Patient Has Paino Yes Site Locations Pain Location: Pain in Ulcers Duration of the Pain. Constant / Intermittento Intermittent Rate the pain. Current Pain Level: 7 Character of Pain Describe the Pain: Other: stings Pain Management and Medication Current Pain Management: Medication: Yes Rest: Yes Electronic Signature(s) Signed: 08/07/2022 10:42:44 AM By: Gretta Cool, BSN, RN, CWS, Kim RN, BSN Signed: 08/10/2022 12:10:36 PM By: [name] Entered By: [name] on 08/07/2022 08:18:44 Taussig, [name] ([id number]) -------------------------------------------------------------------------------- Patient/Caregiver Education Details Patient Name: [name] Ramus L. Date of Service: 08/07/2022 8:15 AM Medical Record Number: [id number] Patient Account Number: [id number] Date of Birth/Gender: 03-16-1941 (81 y.o. F) Treating RN: [name] Primary Care Physician: [name] Other Clinician: Massie [name] Referring Physician: Tracie [name] Treating Physician/Extender: [name] in Treatment: 2 Education Assessment Education Provided To: Patient Education Topics Provided Wound/Skin Impairment: Handouts: Other: continue wound care as directed Methods: Explain/Verbal Responses: State content correctly Electronic Signature(s) Signed: 08/10/2022 12:10:36 PM By: [name] Entered By: [name] on 08/07/2022 08:56:52 Beel, Zali L. ([id number]) -------------------------------------------------------------------------------- Wound Assessment Details Patient Name: [name] Ramus L. Date of Service: 08/07/2022 8:15 AM Medical Record Number:  [id number] Patient Account Number: [id number] Date of Birth/Sex: [birth year] (81 y.o. F) Treating RN: [name] Primary Care Eithen Castiglia: [name] Other Clinician: Massie [name] Referring Sudeep Scheibel: [name] Treating Jenelle Drennon/Extender: [name] in Treatment: 2 Wound Status Wound Number: 3 Primary Venous Leg Ulcer Etiology: Wound Location: Right, Lateral Lower Leg Secondary Diabetic Wound/Ulcer of the Lower Extremity Wounding Event: Gradually Appeared Etiology: Date Acquired: 05/14/2022 Wound Open Weeks Of Treatment: 2 Status: Clustered Wound: No Comorbid Cataracts, Lymphedema, Hypertension, Peripheral History: Arterial Disease, Peripheral Venous Disease, Type II Diabetes, Osteoarthritis, Neuropathy Photos Wound Measurements Length: (cm) 1.5 Width: (cm)  0.9 Depth: (cm) 0.1 Area: (cm) 1.06 Volume: (cm) 0.106 % Reduction in Area: 29.7% % Reduction in Volume: 64.9% Epithelialization: None Wound Description Classification: Full Thickness Without Exposed Support Structu Wound Margin: Indistinct, nonvisible Exudate Amount: Medium Exudate Type: Serosanguineous Exudate Color: red, brown res Foul Odor After Cleansing: No Slough/Fibrino Yes Wound Bed Granulation Amount: Medium (34-66%) Exposed Structure Granulation Quality: Pink Fascia Exposed: No Necrotic Amount: Medium (34-66%) Fat Layer (Subcutaneous Tissue) Exposed: No Necrotic Quality: Adherent Slough Tendon Exposed: No Muscle Exposed: No Joint Exposed: No Bone Exposed: No Treatment Notes Wound #3 (Lower Leg) Wound Laterality: Right, Lateral Cleanser Byram Ancillary Kit - 15 Day Supply Tiffany, Garrett L. ([id number]) Discharge Instruction: Use supplies as instructed; Kit contains: (15) Saline Bullets; (15) 3x3 Gauze; 15 pr Gloves Peri-Wound Care Topical Gentamicin Discharge Instruction: Apply as directed by Joene Gelder. Primary Dressing Silvercel Small 2x2 (in/in) Discharge Instruction:  Apply Silvercel Small 2x2 (in/in) as instructed Secondary Dressing ABD Pad 5x9 (in/in) Discharge Instruction: Cover with ABD pad Secured With Compression Wrap Compression Stockings Add-Ons Electronic Signature(s) Signed: 08/07/2022 10:42:44 AM By: Gretta Cool, BSN, RN, CWS, Kim RN, BSN Signed: 08/10/2022 12:10:36 PM By: [name] Entered By: [name] on 08/07/2022 08:25:47 Burdine, Koi L. ([id number]) -------------------------------------------------------------------------------- Wound Assessment Details Patient Name: [name] Ramus L. Date of Service: 08/07/2022 8:15 AM Medical Record Number: [id number] Patient Account Number: [id number] Date of Birth/Sex: 1941/05/10 (81 y.o. F) Treating RN: [name] Primary Care Marlisa Caridi: [name] Other Clinician: Massie [name] Referring Orion Mole: [name] Treating Ramya Vanbergen/Extender: [name] in Treatment: 2 Wound Status Wound Number: 4 Primary Venous Leg Ulcer Etiology: Wound Location: Left, Midline, Anterior Lower Leg Secondary Diabetic Wound/Ulcer of the Lower Extremity Wounding Event: Gradually Appeared Etiology: Date Acquired: 05/14/2022 Wound Open Weeks Of Treatment: 2 Status: Clustered Wound: No Comorbid Cataracts, Lymphedema, Hypertension, Peripheral History: Arterial Disease, Peripheral Venous Disease, Type II Diabetes, Osteoarthritis, Neuropathy Photos Wound Measurements Length: (cm) 8.5 Width: (cm) 3.2 Depth: (cm) 0.1 Area: (cm) 21.363 Volume: (cm) 2.136 % Reduction in Area: 56.8% % Reduction in Volume: 78.4% Epithelialization: None Wound Description Classification: Full Thickness Without Exposed Support Structu Wound Margin: Indistinct, nonvisible Exudate Amount: Medium Exudate Type: Serosanguineous Exudate Color: red, brown res Foul Odor After Cleansing: No Slough/Fibrino Yes Wound Bed Granulation Amount: Medium (34-66%) Exposed Structure Granulation Quality: Pink Fascia  Exposed: No Necrotic Amount: Medium (34-66%) Fat Layer (Subcutaneous Tissue) Exposed: Yes Necrotic Quality: Adherent Slough Tendon Exposed: No Muscle Exposed: No Joint Exposed: No Bone Exposed: No Treatment Notes Wound #4 (Lower Leg) Wound Laterality: Left, Midline, Anterior Cleanser Byram Ancillary Kit - 15 Day Supply Koc, Kathlen L. ([id number]) Discharge Instruction: Use supplies as instructed; Kit contains: (15) Saline Bullets; (15) 3x3 Gauze; 15 pr Gloves Peri-Wound Care Topical Gentamicin Discharge Instruction: Apply as directed by Abdou Stocks. Primary Dressing Silvercel 4 1/4x 4 1/4 (in/in) Discharge Instruction: Apply Silvercel 4 1/4x 4 1/4 (in/in) as instructed Secondary Dressing ABD Pad 5x9 (in/in) Discharge Instruction: Cover with ABD pad Secured With Tubigrip Size C, 2.75x10 (in/yd) Discharge Instruction: Apply 3 Tubigrip C 3-finger-widths below knee to base of toes to secure dressing and/or for swelling. Compression Wrap Compression Stockings Add-Ons Electronic Signature(s) Signed: 08/07/2022 10:42:44 AM By: Gretta Cool, BSN, RN, CWS, Kim RN, BSN Signed: 08/10/2022 12:10:36 PM By: [name] Entered By: [name] on 08/07/2022 08:26:19 Dobbs, [name] ([id number]) -------------------------------------------------------------------------------- Wound Assessment Details Patient Name: [name] Ramus L. Date of Service: 08/07/2022 8:15 AM Medical Record Number: [id number] Patient Account Number: [id number] Date of Birth/Sex: [birth year] (81 y.o. F)  Treating RN: [name] Primary Care Khamron Gellert: [name] Other Clinician: Massie [name] Referring Pratyush Ammon: [name] Treating Nikyla Navedo/Extender: [name] in Treatment: 2 Wound Status Wound Number: 5 Primary Venous Leg Ulcer Etiology: Wound Location: Left, Posterior Lower Leg Secondary Diabetic Wound/Ulcer of the Lower Extremity Wounding Event: Gradually Appeared Etiology: Date  Acquired: 05/14/2022 Wound Open Weeks Of Treatment: 2 Status: Clustered Wound: No Comorbid Cataracts, Lymphedema, Hypertension, Peripheral History: Arterial Disease, Peripheral Venous Disease, Type II Diabetes, Osteoarthritis, Neuropathy Photos Wound Measurements Length: (cm) 0.7 Width: (cm) 0.6 Depth: (cm) 0.1 Area: (cm) 0.33 Volume: (cm) 0.033 % Reduction in Area: 88.3% % Reduction in Volume: 88.3% Epithelialization: None Wound Description Classification: Full Thickness Without Exposed Support Structu Wound Margin: Indistinct, nonvisible Exudate Amount: Medium Exudate Type: Serosanguineous Exudate Color: red, brown res Foul Odor After Cleansing: No Slough/Fibrino No Wound Bed Granulation Amount: Medium (34-66%) Exposed Structure Necrotic Amount: Medium (34-66%) Fascia Exposed: No Necrotic Quality: Adherent Slough Fat Layer (Subcutaneous Tissue) Exposed: Yes Tendon Exposed: No Muscle Exposed: No Joint Exposed: No Bone Exposed: No Treatment Notes Wound #5 (Lower Leg) Wound Laterality: Left, Posterior Cleanser [place] Ancillary Kit - 15 Day Supply Milich, Vee L. ([id number]) Discharge Instruction: Use supplies as instructed; Kit contains: (15) Saline Bullets; (15) 3x3 Gauze; 15 pr Gloves Peri-Wound Care Topical Gentamicin Discharge Instruction: Apply as directed by Liya Strollo. Primary Dressing Silvercel Small 2x2 (in/in) Discharge Instruction: Apply Silvercel Small 2x2 (in/in) as instructed Secondary Dressing ABD Pad 5x9 (in/in) Discharge Instruction: Cover with ABD pad Secured With Tubigrip Size C, 2.75x10 (in/yd) Discharge Instruction: Apply 3 Tubigrip C 3-finger-widths below knee to base of toes to secure dressing and/or for swelling. Compression Wrap Compression Stockings Add-Ons Electronic Signature(s) Signed: 08/07/2022 10:42:44 AM By: Gretta Cool, BSN, RN, CWS, Kim RN, BSN Signed: 08/10/2022 12:10:36 PM By: [name] Entered By: [name] on  08/07/2022 08:26:50 Tickner, [name] ([id number]) -------------------------------------------------------------------------------- Vitals Details Patient Name: [name] Ramus L. Date of Service: 08/07/2022 8:15 AM Medical Record Number: [id number] Patient Account Number: [id number] Date of Birth/Sex: 1941/11/08 (81 y.o. F) Treating RN: [name] Primary Care Radwan Cowley: [name] Other Clinician: Massie [name] Referring Katurah Karapetian: [name] Treating Dejha King/Extender: [name] in Treatment: 2 Vital Signs Time Taken: 08:16 Temperature (F): 98.2 Height (in): 59 Pulse (bpm): 83 Weight (lbs): 122 Respiratory Rate (breaths/min): 16 Body Mass Index (BMI): 24.6 Blood Pressure (mmHg): 135/73 Reference Range: 80 - 120 mg / dl Electronic Signature(s) Signed: 08/10/2022 12:10:36 PM By: [name] Entered By: [name] on 08/07/2022 08:18:40

## 2022-08-14 ENCOUNTER — Encounter: Payer: PPO | Attending: Physician Assistant | Admitting: Physician Assistant

## 2022-08-14 DIAGNOSIS — Z882 Allergy status to sulfonamides status: Secondary | ICD-10-CM | POA: Diagnosis not present

## 2022-08-14 DIAGNOSIS — Z833 Family history of diabetes mellitus: Secondary | ICD-10-CM | POA: Diagnosis not present

## 2022-08-14 DIAGNOSIS — Z9011 Acquired absence of right breast and nipple: Secondary | ICD-10-CM | POA: Diagnosis not present

## 2022-08-14 DIAGNOSIS — E039 Hypothyroidism, unspecified: Secondary | ICD-10-CM | POA: Diagnosis not present

## 2022-08-14 DIAGNOSIS — L97812 Non-pressure chronic ulcer of other part of right lower leg with fat layer exposed: Secondary | ICD-10-CM | POA: Diagnosis not present

## 2022-08-14 DIAGNOSIS — M109 Gout, unspecified: Secondary | ICD-10-CM | POA: Diagnosis not present

## 2022-08-14 DIAGNOSIS — B9562 Methicillin resistant Staphylococcus aureus infection as the cause of diseases classified elsewhere: Secondary | ICD-10-CM | POA: Diagnosis not present

## 2022-08-14 DIAGNOSIS — L97822 Non-pressure chronic ulcer of other part of left lower leg with fat layer exposed: Secondary | ICD-10-CM | POA: Diagnosis not present

## 2022-08-14 DIAGNOSIS — I5032 Chronic diastolic (congestive) heart failure: Secondary | ICD-10-CM | POA: Diagnosis not present

## 2022-08-14 DIAGNOSIS — L03115 Cellulitis of right lower limb: Secondary | ICD-10-CM | POA: Diagnosis not present

## 2022-08-14 DIAGNOSIS — E7800 Pure hypercholesterolemia, unspecified: Secondary | ICD-10-CM | POA: Diagnosis not present

## 2022-08-14 DIAGNOSIS — I110 Hypertensive heart disease with heart failure: Secondary | ICD-10-CM | POA: Diagnosis not present

## 2022-08-14 DIAGNOSIS — Z79890 Hormone replacement therapy (postmenopausal): Secondary | ICD-10-CM | POA: Diagnosis not present

## 2022-08-14 DIAGNOSIS — T364X5A Adverse effect of tetracyclines, initial encounter: Secondary | ICD-10-CM | POA: Diagnosis not present

## 2022-08-14 DIAGNOSIS — I87333 Chronic venous hypertension (idiopathic) with ulcer and inflammation of bilateral lower extremity: Secondary | ICD-10-CM | POA: Diagnosis not present

## 2022-08-14 DIAGNOSIS — E119 Type 2 diabetes mellitus without complications: Secondary | ICD-10-CM | POA: Diagnosis not present

## 2022-08-14 DIAGNOSIS — I10 Essential (primary) hypertension: Secondary | ICD-10-CM | POA: Diagnosis not present

## 2022-08-14 DIAGNOSIS — M79604 Pain in right leg: Secondary | ICD-10-CM | POA: Diagnosis present

## 2022-08-14 DIAGNOSIS — E1151 Type 2 diabetes mellitus with diabetic peripheral angiopathy without gangrene: Secondary | ICD-10-CM | POA: Diagnosis not present

## 2022-08-14 DIAGNOSIS — L03116 Cellulitis of left lower limb: Secondary | ICD-10-CM | POA: Diagnosis not present

## 2022-08-14 DIAGNOSIS — Z888 Allergy status to other drugs, medicaments and biological substances status: Secondary | ICD-10-CM | POA: Diagnosis not present

## 2022-08-14 DIAGNOSIS — R197 Diarrhea, unspecified: Secondary | ICD-10-CM | POA: Diagnosis not present

## 2022-08-14 DIAGNOSIS — L97222 Non-pressure chronic ulcer of left calf with fat layer exposed: Secondary | ICD-10-CM | POA: Diagnosis not present

## 2022-08-14 DIAGNOSIS — Z7984 Long term (current) use of oral hypoglycemic drugs: Secondary | ICD-10-CM | POA: Diagnosis not present

## 2022-08-14 DIAGNOSIS — Z791 Long term (current) use of non-steroidal anti-inflammatories (NSAID): Secondary | ICD-10-CM | POA: Diagnosis not present

## 2022-08-14 DIAGNOSIS — E440 Moderate protein-calorie malnutrition: Secondary | ICD-10-CM | POA: Diagnosis not present

## 2022-08-14 DIAGNOSIS — Z79899 Other long term (current) drug therapy: Secondary | ICD-10-CM | POA: Diagnosis not present

## 2022-08-14 DIAGNOSIS — Z853 Personal history of malignant neoplasm of breast: Secondary | ICD-10-CM | POA: Diagnosis not present

## 2022-08-14 DIAGNOSIS — Z923 Personal history of irradiation: Secondary | ICD-10-CM | POA: Diagnosis not present

## 2022-08-14 DIAGNOSIS — E785 Hyperlipidemia, unspecified: Secondary | ICD-10-CM | POA: Diagnosis not present

## 2022-08-14 DIAGNOSIS — D509 Iron deficiency anemia, unspecified: Secondary | ICD-10-CM | POA: Diagnosis not present

## 2022-08-14 DIAGNOSIS — Z6824 Body mass index (BMI) 24.0-24.9, adult: Secondary | ICD-10-CM | POA: Diagnosis not present

## 2022-08-14 NOTE — Progress Notes (Signed)
MCKENZIE, BOVE ([id number]) Visit Report for 08/14/2022 Chief Complaint Document Details Patient Name: [name], [name]. Date of Service: 08/14/2022 2:00 PM Medical Record Number: [id number] Patient Account Number: [id number] Date of Birth/Sex: 12/14/1940 (81 y.o. F) Treating RN: Carlene Coria Primary Care Provider: Tracie Harrier Other Clinician: Referring Provider: Tracie Harrier Treating Provider/Extender: Skipper Cliche in Treatment: 3 Information Obtained from: Patient Chief Complaint Bilateral LE Ulcers Electronic Signature(s) Signed: 08/14/2022 2:02:49 PM By: Worthy Keeler PA-C Entered By: Worthy Keeler on 08/14/2022 14:02:49 [place], [name] ([id number]) -------------------------------------------------------------------------------- HPI Details Patient Name: [name]. Date of Service: 08/14/2022 2:00 PM Medical Record Number: [id number] Patient Account Number: [id number] Date of Birth/Sex: 11-27-1940 (81 y.o. F) Treating RN: Carlene Coria Primary Care Provider: Tracie Harrier Other Clinician: Referring Provider: Tracie Harrier Treating Provider/Extender: Skipper Cliche in Treatment: 3 History of Present Illness HPI Description: 81 year old patient who comes with a referral for bilateral lower extremity edema and a lower extremity ulceration and has been sent by her PCP Dr. Placido Sou. I understand the patient was recently put on amoxicillin and doxycycline but could not tolerate the amoxicillin. doxycycline course was completed. a BNP and EKG was supposed to be normal and the patient did not have any dyspnea. the patient has been on a diuretic. The patient was also prescribed a pair of elastic compression stockings of the 20-30 mmHg pressure variety. x-ray of the right ankle was done on 09/20/2015 and showed posttraumatic and postsurgical changes of the right ankle with secondary degenerative changes of the tibiotalar joint and to a  lesser degree the subtalar joint. No definite acute bony abnormalities are noted. Past medical history significant for diabetes mellitus, hypertension, hyperlipidemia, right breast cancer treated with a mastectomy in 2014. She has never smoked. 10/24/2015 -- she had delayed her vascular test because of her husband surgery but she is now ready to get him taken care of. He is also unable to use compression stockings and hence we will need to order her Juzo wraps. 10/31/2015-- was seen by Dr. Lucky Cowboy on 10/28/2015. She had a left lower extremity arterial duplex done at his office a couple of years ago and that was essentially normal. Today they performed a venous duplex which revealed no evidence of deep vein thrombosis, superficial thrombophlebitis, no venous reflex was seen on the right and a minimal amount of reflux was seen on the left great saphenous vein but no significant reflux was seen. Impression was that there was a component of lymphedema present from a previous surgery and he would recommend compression stockings and leg elevation. Readmission: 07-24-2022 upon evaluation today patient presents for initial inspection here in our clinic concerning issues that she has been having with her legs this is actually been going on for several years according to what her family member with her today tells me as well as what the patient reiterates as well. She is currently most recently been seeing Dr. Vickki Muff and subsequently he had her in [place] boots. However 2 weeks ago he referred her to [place] and then subsequently took her out of the [place] boot wraps at that point. At this time the left leg looks to be worse in the right leg currently. She is on Lasix and lisinopril with hydrochlorothiazide she has high blood pressure she also has issues currently with lower extremity swelling and edema which has been an ongoing issue for her as well. Patient does have a history of chronic venous hypertension, lymphedema,  diabetes mellitus type  2, hypertension, peripheral vascular disease, and neuropathy. Currently she is on Lasix as well as lisinopril with hydrochlorothiazide. 08-02-2022 upon evaluation today patient presents for reevaluation the good news is she is actually doing somewhat better in regard to the wound and the overall appearance and sinuses. The unfortunate thing is her infection really is not significantly improved we did have to switch out her antibiotic once we got that final result back and I switched her to Levaquin and away from the doxycycline. Unfortunately the doxycycline had been doing poorly for her. In fact she had had diarrhea from the time she started taking it on Friday and she is still having it when she shows up today for evaluation. Again I was not aware of this and obviously she does appear to be somewhat dehydrated as well based on what I see. My concern which I discussed with the patient today is the possibility of a C. difficile infection. With that being said fact this started immediately upon taking the doxycycline makes me think that it was just the medicine and is not completely out of her system yet despite having taken the last dose Tuesday morning. Nonetheless with what we are seeing currently I want to be sure that reason I Minna contact her primary care provider and see if a would be willing to see her and test for C. difficile infection. 08-07-2022 upon evaluation today patient appears to be doing well currently in regard to her wounds which are actually measuring much better this is great news. Fortunately I do not see any signs of active infection locally or systemically at this time which is great as well. The good news is she was tested for a C. difficile infection and it was negative. I am very pleased and thankful for primary care provider for doing that so this means that she was just having a severe reaction to the doxycycline we have added that to her allergy list  at this point. 08-14-2022 upon evaluation today patient appears to be doing well currently in regard to her dehydration she is actually significantly improved compared to last time I saw her last week. With that being said I do not see any evidence of active infection systemically at this time which is great news. However locally she still does appear to have cellulitis in left lower extremity. I discussed with her today that I do believe she would benefit from going ahead and starting the [place]. Again we will concerned about the C. difficile infection of the diarrhea but that this turned out to be just an issue with a reaction to the doxycycline. My hope is that the Levaquin will not cause her any complication and will be able to treat the infection. I did review her arterial study as well which was dated on 07-31-2022. It showed that she had a ABI on the right of 1.30 and on the left of 1.27 with a TBI on the right of 1.12 and the left 1.21 this is a normal arterial study. Again on the patient's wound culture she actually did show evidence of Proteus, Morganella, and MRSA. Levaquin is a good option here across the board. Electronic Signature(s) Signed: 08/14/2022 2:41:52 PM By: Worthy Keeler PA-C Entered By: Worthy Keeler on 08/14/2022 14:41:52 [place], [name] ([id number]) -------------------------------------------------------------------------------- Physical Exam Details Patient Name: [name] Date of Service: 08/14/2022 2:00 PM Medical Record Number: [id number] Patient Account Number: [id number] Date of Birth/Sex: 10-21-1941 (81 y.o. F) Treating RN: Carlene Coria  Primary Care Provider: Tracie Harrier Other Clinician: Referring Provider: Tracie Harrier Treating Provider/Extender: Skipper Cliche in Treatment: 3 Constitutional Well-nourished and well-hydrated in no acute distress. Respiratory normal breathing without difficulty. Psychiatric this patient is  able to make decisions and demonstrates good insight into disease process. Alert and Oriented x 3. pleasant and cooperative. Notes Upon inspection patient's wound bed did not require any sharp debridement she does have a tremendous amount of swelling and definite signs of erythema on the left lower extremity. She needs to get this under control both with respect to the swelling as well as the actual infection itself to this and I think we do need to initiate a compression wrap as well as have her start taking the Levaquin at this point. Patient voiced understanding. Electronic Signature(s) Signed: 08/14/2022 2:42:27 PM By: Worthy Keeler PA-C Entered By: Worthy Keeler on 08/14/2022 14:42:27 [place], [name] ([id number]) -------------------------------------------------------------------------------- Physician Orders Details Patient Name: [name]. Date of Service: 08/14/2022 2:00 PM Medical Record Number: [id number] Patient Account Number: [id number] Date of Birth/Sex: [birth year] (81 y.o. F) Treating RN: Carlene Coria Primary Care Provider: Tracie Harrier Other Clinician: Referring Provider: Tracie Harrier Treating Provider/Extender: Skipper Cliche in Treatment: 3 Verbal / Phone Orders: No Diagnosis Coding ICD-10 Coding Code Description [phone] Chronic venous hypertension (idiopathic) with ulcer and inflammation of bilateral lower extremity I89.0 Lymphedema, not elsewhere classified L97.822 Non-pressure chronic ulcer of other part of left lower leg with fat layer exposed L97.812 Non-pressure chronic ulcer of other part of right lower leg with fat layer exposed E11.622 Type 2 diabetes mellitus with other skin ulcer I10 Essential (primary) hypertension I73.89 Other specified peripheral vascular diseases E11.43 Type 2 diabetes mellitus with diabetic autonomic (poly)neuropathy Follow-up Appointments Wound #3 Right,Lateral Lower Leg o Return Appointment in 1  week. o Nurse Visit as needed Wound #4 Left,Midline,Anterior Lower Leg o Return Appointment in 1 week. o Nurse Visit as needed Wound #5 Left,Posterior Lower Leg o Return Appointment in 1 week. o Nurse Visit as needed Bathing/ Shower/ Hygiene o May shower; gently cleanse wound with antibacterial soap, rinse and pat dry prior to dressing wounds Anesthetic (Use 'Patient Medications' Section for Anesthetic Order Entry) o Lidocaine applied to wound bed Edema Control - Lymphedema / Segmental Compressive Device / Other o Optional: One layer of unna paste to top of compression wrap (to act as an anchor). Medications-Please add to medication list. o P.O. Antibiotics - eat prior to taking antibiotic o Topical Antibiotic - gentamycin cream Wound Treatment Wound #3 - Lower Leg Wound Laterality: Right, Lateral Cleanser: Byram Ancillary Kit - 15 Day Supply 3 x Per Week/30 Days Discharge Instructions: Use supplies as instructed; Kit contains: (15) Saline Bullets; (15) 3x3 Gauze; 15 pr Gloves Topical: Gentamicin 3 x Per Week/30 Days Discharge Instructions: Apply as directed by provider. Primary Dressing: [place]cel Small 2x2 (in/in) (Dispense As Written) 3 x Per Week/30 Days Discharge Instructions: Apply [place] Small 2x2 (in/in) as instructed Secondary Dressing: ABD Pad 5x9 (in/in) 3 x Per Week/30 Days Discharge Instructions: Cover with ABD pad Compression Wrap: 3-LAYER WRAP - Profore Lite LF 3 Multilayer Compression Bandaging System 3 x Per Week/30 Days Discharge Instructions: Apply 3 multi-layer wrap as prescribed. ISEBELLA, UPSHUR ([id number]) Wound #4 - Lower Leg Wound Laterality: Left, Midline, Anterior Cleanser: Byram Ancillary Kit - 15 Day Supply 3 x Per Week/30 Days Discharge Instructions: Use supplies as instructed; Kit contains: (15) Saline Bullets; (15) 3x3 Gauze; 15 pr Gloves Topical: Gentamicin 3 x Per  Week/30 Days Discharge Instructions: Apply as directed by  provider. Primary Dressing: Silvercel Small 2x2 (in/in) (Dispense As Written) 3 x Per Week/30 Days Discharge Instructions: Apply Silvercel Small 2x2 (in/in) as instructed Secondary Dressing: ABD Pad 5x9 (in/in) 3 x Per Week/30 Days Discharge Instructions: Cover with ABD pad Compression Wrap: 3-LAYER WRAP - Profore Lite LF 3 Multilayer Compression Bandaging System 3 x Per Week/30 Days Discharge Instructions: Apply 3 multi-layer wrap as prescribed. Wound #5 - Lower Leg Wound Laterality: Left, Posterior Cleanser: Byram Ancillary Kit - 15 Day Supply 3 x Per Week/30 Days Discharge Instructions: Use supplies as instructed; Kit contains: (15) Saline Bullets; (15) 3x3 Gauze; 15 pr Gloves Topical: Gentamicin 3 x Per Week/30 Days Discharge Instructions: Apply as directed by provider. Primary Dressing: Silvercel Small 2x2 (in/in) (Dispense As Written) 3 x Per Week/30 Days Discharge Instructions: Apply Silvercel Small 2x2 (in/in) as instructed Secondary Dressing: ABD Pad 5x9 (in/in) 3 x Per Week/30 Days Discharge Instructions: Cover with ABD pad Compression Wrap: 3-LAYER WRAP - Profore Lite LF 3 Multilayer Compression Bandaging System 3 x Per Week/30 Days Discharge Instructions: Apply 3 multi-layer wrap as prescribed. Electronic Signature(s) Unsigned Entered By: Carlene Coria on 08/14/2022 14:33:46 Signature(s): Date(s): THARON, KITCH ([id number]) -------------------------------------------------------------------------------- Problem List Details Patient Name: ZAKYA, HALABI. Date of Service: 08/14/2022 2:00 PM Medical Record Number: [id number] Patient Account Number: [id number] Date of Birth/Sex: 08/07/1941 (81 y.o. F) Treating RN: Carlene Coria Primary Care Provider: Tracie Harrier Other Clinician: Referring Provider: Tracie Harrier Treating Provider/Extender: Skipper Cliche in Treatment: 3 Active Problems ICD-10 Encounter Code Description Active Date  MDM Diagnosis I87.333 Chronic venous hypertension (idiopathic) with ulcer and inflammation of 07/24/2022 No Yes bilateral lower extremity I89.0 Lymphedema, not elsewhere classified 07/24/2022 No Yes L97.822 Non-pressure chronic ulcer of other part of left lower leg with fat layer 07/24/2022 No Yes exposed L97.812 Non-pressure chronic ulcer of other part of right lower leg with fat layer 07/24/2022 No Yes exposed E11.622 Type 2 diabetes mellitus with other skin ulcer 07/24/2022 No Yes I10 Essential (primary) hypertension 07/24/2022 No Yes I73.89 Other specified peripheral vascular diseases 07/24/2022 No Yes E11.43 Type 2 diabetes mellitus with diabetic autonomic (poly)neuropathy 07/24/2022 No Yes Inactive Problems Resolved Problems Electronic Signature(s) Signed: 08/14/2022 2:02:45 PM By: Worthy Keeler PA-C Entered By: Worthy Keeler on 08/14/2022 14:02:45 [place], [name] ([id number]) -------------------------------------------------------------------------------- Progress Note Details Patient Name: [name] Date of Service: 08/14/2022 2:00 PM Medical Record Number: [id number] Patient Account Number: [id number] Date of Birth/Sex: 1941/08/26 (81 y.o. F) Treating RN: Carlene Coria Primary Care Provider: Tracie Harrier Other Clinician: Referring Provider: Tracie Harrier Treating Provider/Extender: Skipper Cliche in Treatment: 3 Subjective Chief Complaint Information obtained from Patient Bilateral LE Ulcers History of Present Illness (HPI) 81 year old patient who comes with a referral for bilateral lower extremity edema and a lower extremity ulceration and has been sent by her PCP Dr. Placido Sou. I understand the patient was recently put on amoxicillin and doxycycline but could not tolerate the amoxicillin. doxycycline course was completed. a BNP and EKG was supposed to be normal and the patient did not have any dyspnea. the patient has been on a diuretic. The patient  was also prescribed a pair of elastic compression stockings of the 20-30 mmHg pressure variety. x-ray of the right ankle was done on 09/20/2015 and showed posttraumatic and postsurgical changes of the right ankle with secondary degenerative changes of the tibiotalar joint and to a lesser degree the subtalar joint. No definite acute bony abnormalities  are noted. Past medical history significant for diabetes mellitus, hypertension, hyperlipidemia, right breast cancer treated with a mastectomy in 2014. She has never smoked. 10/24/2015 -- she had delayed her vascular test because of her husband surgery but she is now ready to get him taken care of. He is also unable to use compression stockings and hence we will need to order her Juzo wraps. 10/31/2015-- was seen by Dr. Lucky Cowboy on 10/28/2015. She had a left lower extremity arterial duplex done at his office a couple of years ago and that was essentially normal. Today they performed a venous duplex which revealed no evidence of deep vein thrombosis, superficial thrombophlebitis, no venous reflex was seen on the right and a minimal amount of reflux was seen on the left great saphenous vein but no significant reflux was seen. Impression was that there was a component of lymphedema present from a previous surgery and he would recommend compression stockings and leg elevation. Readmission: 07-24-2022 upon evaluation today patient presents for initial inspection here in our clinic concerning issues that she has been having with her legs this is actually been going on for several years according to what her family member with her today tells me as well as what the patient reiterates as well. She is currently most recently been seeing Dr. Vickki Muff and subsequently he had her in [place] boots. However 2 weeks ago he referred her to [place] and then subsequently took her out of the [place] boot wraps at that point. At this time the left leg looks to be worse in the right leg  currently. She is on Lasix and lisinopril with hydrochlorothiazide she has high blood pressure she also has issues currently with lower extremity swelling and edema which has been an ongoing issue for her as well. Patient does have a history of chronic venous hypertension, lymphedema, diabetes mellitus type 2, hypertension, peripheral vascular disease, and neuropathy. Currently she is on Lasix as well as lisinopril with hydrochlorothiazide. 08-02-2022 upon evaluation today patient presents for reevaluation the good news is she is actually doing somewhat better in regard to the wound and the overall appearance and sinuses. The unfortunate thing is her infection really is not significantly improved we did have to switch out her antibiotic once we got that final result back and I switched her to Levaquin and away from the doxycycline. Unfortunately the doxycycline had been doing poorly for her. In fact she had had diarrhea from the time she started taking it on Friday and she is still having it when she shows up today for evaluation. Again I was not aware of this and obviously she does appear to be somewhat dehydrated as well based on what I see. My concern which I discussed with the patient today is the possibility of a C. difficile infection. With that being said fact this started immediately upon taking the doxycycline makes me think that it was just the medicine and is not completely out of her system yet despite having taken the last dose Tuesday morning. Nonetheless with what we are seeing currently I want to be sure that reason I Minna contact her primary care provider and see if a would be willing to see her and test for C. difficile infection. 08-07-2022 upon evaluation today patient appears to be doing well currently in regard to her wounds which are actually measuring much better this is great news. Fortunately I do not see any signs of active infection locally or systemically at this time which  is  great as well. The good news is she was tested for a C. difficile infection and it was negative. I am very pleased and thankful for primary care provider for doing that so this means that she was just having a severe reaction to the doxycycline we have added that to her allergy list at this point. 08-14-2022 upon evaluation today patient appears to be doing well currently in regard to her dehydration she is actually significantly improved compared to last time I saw her last week. With that being said I do not see any evidence of active infection systemically at this time which is great news. However locally she still does appear to have cellulitis in left lower extremity. I discussed with her today that I do believe she would benefit from going ahead and starting the [place]. Again we will concerned about the C. difficile infection of the diarrhea but that this turned out to be just an issue with a reaction to the doxycycline. My hope is that the Levaquin will not cause her any complication and will be able to treat the infection. I did review her arterial study as well which was dated on 07-31-2022. It showed that she had a ABI on the right of 1.30 and on the left of 1.27 with a TBI on the right of 1.12 and the left 1.21 this is a normal arterial study. Again on the patient's wound culture she actually did show evidence of Proteus, Morganella, and MRSA. Levaquin is a good option here across the board. Demery, Charonda L. ([id number]) Objective Constitutional Well-nourished and well-hydrated in no acute distress. Vitals Time Taken: 2:01 PM, Height: 59 in, Weight: 122 lbs, BMI: 24.6, Temperature: 98.2 F, Pulse: 84 bpm, Respiratory Rate: 18 breaths/min, Blood Pressure: 137/76 mmHg. Respiratory normal breathing without difficulty. Psychiatric this patient is able to make decisions and demonstrates good insight into disease process. Alert and Oriented x 3. pleasant and cooperative. General  Notes: Upon inspection patient's wound bed did not require any sharp debridement she does have a tremendous amount of swelling and definite signs of erythema on the left lower extremity. She needs to get this under control both with respect to the swelling as well as the actual infection itself to this and I think we do need to initiate a compression wrap as well as have her start taking the Levaquin at this point. Patient voiced understanding. Integumentary (Hair, Skin) Wound #3 status is Open. Original cause of wound was Gradually Appeared. The date acquired was: 05/14/2022. The wound has been in treatment 3 weeks. The wound is located on the Right,Lateral Lower Leg. The wound measures 1.5cm length x 1cm width x 0.1cm depth; 1.178cm^2 area and 0.118cm^3 volume. There is Fat Layer (Subcutaneous Tissue) exposed. There is no tunneling or undermining noted. There is a medium amount of serosanguineous drainage noted. The wound margin is indistinct and nonvisible. There is medium (34-66%) pink granulation within the wound bed. There is a medium (34-66%) amount of necrotic tissue within the wound bed including Adherent Slough. Wound #4 status is Open. Original cause of wound was Gradually Appeared. The date acquired was: 05/14/2022. The wound has been in treatment 3 weeks. The wound is located on the Left,Midline,Anterior Lower Leg. The wound measures 7cm length x 2.5cm width x 0.1cm depth; 13.744cm^2 area and 1.374cm^3 volume. There is Fat Layer (Subcutaneous Tissue) exposed. There is no tunneling or undermining noted. There is a medium amount of serosanguineous drainage noted. The wound margin is indistinct and nonvisible. There  is medium (34-66%) pink granulation within the wound bed. There is a medium (34-66%) amount of necrotic tissue within the wound bed including Adherent Slough. Wound #5 status is Open. Original cause of wound was Gradually Appeared. The date acquired was: 05/14/2022. The wound has been  in treatment 3 weeks. The wound is located on the Left,Posterior Lower Leg. The wound measures 1cm length x 1.2cm width x 0.1cm depth; 0.942cm^2 area and 0.094cm^3 volume. There is Fat Layer (Subcutaneous Tissue) exposed. There is no tunneling or undermining noted. There is a medium amount of serosanguineous drainage noted. The wound margin is indistinct and nonvisible. There is medium (34-66%) granulation within the wound bed. There is a medium (34-66%) amount of necrotic tissue within the wound bed including Adherent Slough. Assessment Active Problems ICD-10 Chronic venous hypertension (idiopathic) with ulcer and inflammation of bilateral lower extremity Lymphedema, not elsewhere classified Non-pressure chronic ulcer of other part of left lower leg with fat layer exposed Non-pressure chronic ulcer of other part of right lower leg with fat layer exposed Type 2 diabetes mellitus with other skin ulcer Essential (primary) hypertension Other specified peripheral vascular diseases Type 2 diabetes mellitus with diabetic autonomic (poly)neuropathy Procedures Wound #3 Pre-procedure diagnosis of Wound #3 is a Venous Leg Ulcer located on the Right,Lateral Lower Leg . There was a Three Layer Compression Therapy Procedure by Carlene Coria, RN. Post procedure Diagnosis Wound #3: Same as Pre-Procedure Wound #4 Pre-procedure diagnosis of Wound #4 is a Venous Leg Ulcer located on the Left,Midline,Anterior Lower Leg . There was a Three Layer Compression ELECIA, SERAFIN ([id number]) Therapy Procedure by Carlene Coria, RN. Post procedure Diagnosis Wound #4: Same as Pre-Procedure Wound #5 Pre-procedure diagnosis of Wound #5 is a Venous Leg Ulcer located on the Left,Posterior Lower Leg . There was a Three Layer Compression Therapy Procedure by Carlene Coria, RN. Post procedure Diagnosis Wound #5: Same as Pre-Procedure Plan Follow-up Appointments: Wound #3 Right,Lateral Lower Leg: Return Appointment in  1 week. Nurse Visit as needed Wound #4 Left,Midline,Anterior Lower Leg: Return Appointment in 1 week. Nurse Visit as needed Wound #5 Left,Posterior Lower Leg: Return Appointment in 1 week. Nurse Visit as needed Bathing/ Shower/ Hygiene: May shower; gently cleanse wound with antibacterial soap, rinse and pat dry prior to dressing wounds Anesthetic (Use 'Patient Medications' Section for Anesthetic Order Entry): Lidocaine applied to wound bed Edema Control - Lymphedema / Segmental Compressive Device / Other: Optional: One layer of unna paste to top of compression wrap (to act as an anchor). Medications-Please add to medication list.: P.O. Antibiotics - eat prior to taking antibiotic Topical Antibiotic - gentamycin cream WOUND #3: - Lower Leg Wound Laterality: Right, Lateral Cleanser: Byram Ancillary Kit - 15 Day Supply 3 x Per Week/30 Days Discharge Instructions: Use supplies as instructed; Kit contains: (15) Saline Bullets; (15) 3x3 Gauze; 15 pr Gloves Topical: Gentamicin 3 x Per Week/30 Days Discharge Instructions: Apply as directed by provider. Primary Dressing: Silvercel Small 2x2 (in/in) (Dispense As Written) 3 x Per Week/30 Days Discharge Instructions: Apply Silvercel Small 2x2 (in/in) as instructed Secondary Dressing: ABD Pad 5x9 (in/in) 3 x Per Week/30 Days Discharge Instructions: Cover with ABD pad Compression Wrap: 3-LAYER WRAP - Profore Lite LF 3 Multilayer Compression Bandaging System 3 x Per Week/30 Days Discharge Instructions: Apply 3 multi-layer wrap as prescribed. WOUND #4: - Lower Leg Wound Laterality: Left, Midline, Anterior Cleanser: Byram Ancillary Kit - 15 Day Supply 3 x Per Week/30 Days Discharge Instructions: Use supplies as instructed; Kit contains: (15) Saline  Bullets; (15) 3x3 Gauze; 15 pr Gloves Topical: Gentamicin 3 x Per Week/30 Days Discharge Instructions: Apply as directed by provider. Primary Dressing: Silvercel Small 2x2 (in/in) (Dispense As Written) 3  x Per Week/30 Days Discharge Instructions: Apply Silvercel Small 2x2 (in/in) as instructed Secondary Dressing: ABD Pad 5x9 (in/in) 3 x Per Week/30 Days Discharge Instructions: Cover with ABD pad Compression Wrap: 3-LAYER WRAP - Profore Lite LF 3 Multilayer Compression Bandaging System 3 x Per Week/30 Days Discharge Instructions: Apply 3 multi-layer wrap as prescribed. WOUND #5: - Lower Leg Wound Laterality: Left, Posterior Cleanser: Byram Ancillary Kit - 15 Day Supply 3 x Per Week/30 Days Discharge Instructions: Use supplies as instructed; Kit contains: (15) Saline Bullets; (15) 3x3 Gauze; 15 pr Gloves Topical: Gentamicin 3 x Per Week/30 Days Discharge Instructions: Apply as directed by provider. Primary Dressing: Silvercel Small 2x2 (in/in) (Dispense As Written) 3 x Per Week/30 Days Discharge Instructions: Apply Silvercel Small 2x2 (in/in) as instructed Secondary Dressing: ABD Pad 5x9 (in/in) 3 x Per Week/30 Days Discharge Instructions: Cover with ABD pad Compression Wrap: 3-LAYER WRAP - Profore Lite LF 3 Multilayer Compression Bandaging System 3 x Per Week/30 Days Discharge Instructions: Apply 3 multi-layer wrap as prescribed. 1. Based on what I am seeing I would recommend that the patient go ahead and start taking the Levaquin which I think is good to be the appropriate thing to do at this point. 2. I am also can recommend that we have the patient continue to monitor for any signs of worsening or infection. Obviously if anything changes she should contact the office and let me know. This would include fevers, chills, nausea, vomiting, or diarrhea. If this does occur she should go to the ER for further evaluation and treatment. Dwiggins, Paisly L. ([id number]) 3. I am also can recommend that we have the patient continue with the silver alginate dressing followed by an ABD pad and I am going to suggest a 3 layer compression wrap bilaterally. We will see patient back for reevaluation in 1  week here in the clinic. If anything worsens or changes patient will contact our office for additional recommendations. Electronic Signature(s) Signed: 08/14/2022 2:43:15 PM By: Worthy Keeler PA-C Entered By: Worthy Keeler on 08/14/2022 14:43:15 [place]more, [name] ([id number]) -------------------------------------------------------------------------------- SuperBill Details Patient Name: [name]. Date of Service: 08/14/2022 Medical Record Number: [id number] Patient Account Number: [id number] Date of Birth/Sex: July 15, 1941 (81 y.o. F) Treating RN: Carlene Coria Primary Care Provider: Tracie Harrier Other Clinician: Referring Provider: Tracie Harrier Treating Provider/Extender: Skipper Cliche in Treatment: 3 Diagnosis Coding ICD-10 Codes Code Description [phone] Chronic venous hypertension (idiopathic) with ulcer and inflammation of bilateral lower extremity I89.0 Lymphedema, not elsewhere classified L97.822 Non-pressure chronic ulcer of other part of left lower leg with fat layer exposed L97.812 Non-pressure chronic ulcer of other part of right lower leg with fat layer exposed E11.622 Type 2 diabetes mellitus with other skin ulcer I10 Essential (primary) hypertension I73.89 Other specified peripheral vascular diseases E11.43 Type 2 diabetes mellitus with diabetic autonomic (poly)neuropathy Facility Procedures CPT4: Description Modifier Quantity Code [id number] - WOUND CARE VISIT-LEV 4 EST PT 1 CPT4: [id number] BILATERAL: Application of multi-layer venous compression system; leg (below knee), including 1 ankle and foot. Physician Procedures CPT4 Code Description: [id number] - WC PHYS LEVEL 4 - EST PT Modifier: Quantity: 1 CPT4 Code Description: ICD-10 Diagnosis Description I87.333 Chronic venous hypertension (idiopathic) with ulcer and inflammation of I89.0 Lymphedema, not elsewhere classified L97.822 Non-pressure chronic ulcer of  other part of left  lower leg with fat  laye L97.812 Non-pressure chronic ulcer of other part of right lower leg with fat lay Modifier: bilateral lower extre r exposed er exposed Quantity: Art gallery manager Signature(s) Signed: 08/14/2022 2:43:40 PM By: Worthy Keeler PA-C Entered By: Worthy Keeler on 08/14/2022 14:43:40

## 2022-08-15 NOTE — Progress Notes (Addendum)
JESSICE, MADILL ([id number]) [id number] Page 1 of 13 Visit Report for 08/14/2022 Arrival Information Details Patient Name: Date of Service: [facility], [place] [name]. 08/14/2022 2:00 PM Medical Record Number: [id number] Patient Account Number: [id number] Date of Birth/Sex: Treating RN: [birth year] (81 y.o. [name] Primary Care Karsten Howry: Tracie Harrier Other Clinician: Referring Nicholette Dolson: Treating Ashar Lewinski/Extender: Solon Palm Weeks in Treatment: 3 Visit Information History Since Last Visit All ordered tests and consults were completed: No Patient Arrived: Wheel Chair Added or deleted any medications: No Arrival Time: 14:00 Any new allergies or adverse reactions: No Accompanied By: husband Had a fall or experienced change in No Transfer Assistance: None activities of daily living that may affect Patient Identification Verified: Yes risk of falls: Secondary Verification Process Completed: Yes Signs or symptoms of abuse/neglect since last visito No Patient Requires Transmission-Based Precautions: No Hospitalized since last visit: No Patient Has Alerts: Yes Implantable device outside of the clinic excluding No Patient Alerts: Type II Diabetic cellular tissue based products placed in the center since last visit: Has Dressing in Place as Prescribed: Yes Has Compression in Place as Prescribed: Yes Pain Present Now: No Electronic Signature(s) Signed: 08/15/2022 11:46:40 AM By: Carlene Coria RN Entered By: Carlene Coria on 08/14/2022 14:01:02 -------------------------------------------------------------------------------- Clinic Level of Care Assessment Details Patient Name: Date of Service: [facility], DIA [name]. 08/14/2022 2:00 PM Medical Record Number: [id number] Patient Account Number: [id number] Date of Birth/Sex: Treating RN: August 27, 1941 (80 y.o. [name] Primary Care Kayen Grabel: Tracie Harrier Other Clinician: Referring  Abhimanyu Cruces: Treating Zylpha Poynor/Extender: Solon Palm Weeks in Treatment: 3 Clinic Level of Care Assessment Items TOOL 4 Quantity Score X- 1 0 Use when only an EandM is performed on FOLLOW-UP visit ASSESSMENTS - Nursing Assessment / Reassessment X- 1 10 Reassessment of Co-morbidities (includes updates in patient status) [name], [name] ([id number]) [id number].pdf Page 2 of 13 X- 1 5 Reassessment of Adherence to Treatment Plan ASSESSMENTS - Wound and Skin A ssessment / Reassessment []  - 0 Simple Wound Assessment / Reassessment - one wound X- 3 5 Complex Wound Assessment / Reassessment - multiple wounds []  - 0 Dermatologic / Skin Assessment (not related to wound area) ASSESSMENTS - Focused Assessment []  - 0 Circumferential Edema Measurements - multi extremities []  - 0 Nutritional Assessment / Counseling / Intervention []  - 0 Lower Extremity Assessment (monofilament, tuning fork, pulses) []  - 0 Peripheral Arterial Disease Assessment (using hand held doppler) ASSESSMENTS - Ostomy and/or Continence Assessment and Care []  - 0 Incontinence Assessment and Management []  - 0 Ostomy Care Assessment and Management (repouching, etc.) PROCESS - Coordination of Care X - Simple Patient / Family Education for ongoing care 1 15 []  - 0 Complex (extensive) Patient / Family Education for ongoing care []  - 0 Staff obtains Programmer, systems, Records, T Results / Process Orders est []  - 0 Staff telephones HHA, Nursing Homes / Clarify orders / etc []  - 0 Routine Transfer to another Facility (non-emergent condition) []  - 0 Routine Hospital Admission (non-emergent condition) []  - 0 New Admissions / Biomedical engineer / Ordering NPWT Apligraf, etc. , []  - 0 Emergency Hospital Admission (emergent condition) X- 1 10 Simple Discharge Coordination []  - 0 Complex (extensive) Discharge Coordination PROCESS - Special Needs []  - 0 Pediatric / Minor Patient  Management []  - 0 Isolation Patient Management []  - 0 Hearing / Language / Visual special needs []  - 0 Assessment of Community assistance (transportation, D/C planning, etc.) []  - 0 Additional assistance / Altered mentation []  -  0 Support Surface(s) Assessment (bed, cushion, seat, etc.) INTERVENTIONS - Wound Cleansing / Measurement $RemoveBefor'[]'zkjNSjsILuDX$  - 0 Simple Wound Cleansing - one wound X- 3 5 Complex Wound Cleansing - multiple wounds X- 1 5 Wound Imaging (photographs - any number of wounds) $RemoveBe'[]'GxcbIvfCT$  - 0 Wound Tracing (instead of photographs) $RemoveBeforeD'[]'kKcDXxPfYCmHUN$  - 0 Simple Wound Measurement - one wound X- 3 5 Complex Wound Measurement - multiple wounds INTERVENTIONS - Wound Dressings X - Small Wound Dressing one or multiple wounds 3 10 $Re'[]'sTk$  - 0 Medium Wound Dressing one or multiple wounds $RemoveBeforeD'[]'ZvonXstYIjWCaX$  - 0 Large Wound Dressing one or multiple wounds $RemoveBeforeD'[]'QAkJAaTOuTesfO$  - 0 Application of Medications - topical $RemoveB'[]'gxQyqZAr$  - 0 Application of Medications - injection INTERVENTIONS - Miscellaneous [name], [name] ([id number]) [id number] Page 3 of 13 $Rem'[]'brqf$  - 0 External ear exam $Remove'[]'hliwoIR$  - 0 Specimen Collection (cultures, biopsies, blood, body fluids, etc.) $RemoveBefor'[]'VFhfUqZiTdRZ$  - 0 Specimen(s) / Culture(s) sent or taken to Lab for analysis $RemoveBefo'[]'indnWJPieSh$  - 0 Patient Transfer (multiple staff / Harrel Lemon Lift / Similar devices) $RemoveBeforeDE'[]'oDSEXMhxLctjnRO$  - 0 Simple Staple / Suture removal (25 or less) $Remove'[]'AOBlfrH$  - 0 Complex Staple / Suture removal (26 or more) $Remove'[]'qymfEKS$  - 0 Hypo / Hyperglycemic Management (close monitor of Blood Glucose) $RemoveBefore'[]'jcWMlqtaDopRi$  - 0 Ankle / Brachial Index (ABI) - do not check if billed separately X- 1 5 Vital Signs Has the patient been seen at the hospital within the last three years: Yes Total Score: 125 Level Of Care: New/Established - Level 4 Electronic Signature(s) Signed: 08/15/2022 11:46:40 AM By: Carlene Coria RN Entered By: Carlene Coria on 08/14/2022 14:34:28 -------------------------------------------------------------------------------- Compression Therapy  Details Patient Name: Date of Service: [facility], DIA [name]. 08/14/2022 2:00 PM Medical Record Number: [id number] Patient Account Number: [id number] Date of Birth/Sex: Treating RN: [birth year] (81 y.o. [name] Primary Care Hershal Eriksson: Tracie Harrier Other Clinician: Referring Christino Mcglinchey: Treating Rondi Ivy/Extender: Solon Palm Weeks in Treatment: 3 Compression Therapy Performed for Wound Assessment: Wound #3 Right,Lateral Lower Leg Performed By: Clinician Carlene Coria, RN Compression Type: Three Layer Post Procedure Diagnosis Same as Pre-procedure Electronic Signature(s) Signed: 08/15/2022 11:46:40 AM By: Carlene Coria RN Entered By: Carlene Coria on 08/14/2022 14:35:08 -------------------------------------------------------------------------------- Compression Therapy Details Patient Name: Date of Service: [facility], DIA [name]. 08/14/2022 2:00 PM Medical Record Number: [id number] Patient Account Number: [id number] YEILYN, GENT ([id number]) [phone].pdf Page 4 of 13 Date of Birth/Sex: Treating RN: 23-Aug-1941 (81 y.o. [name] Primary Care Chistine Dematteo: Tracie Harrier Other Clinician: Referring Ashely Goosby: Treating [name]/Extender: Solon Palm Weeks in Treatment: 3 Compression Therapy Performed for Wound Assessment: Wound #4 Left,Midline,Anterior Lower Leg Performed By: Clinician Carlene Coria, RN Compression Type: Three Layer Post Procedure Diagnosis Same as Pre-procedure Electronic Signature(s) Signed: 08/15/2022 11:46:40 AM By: Carlene Coria RN Entered By: Carlene Coria on 08/14/2022 14:35:21 -------------------------------------------------------------------------------- Compression Therapy Details Patient Name: Date of Service: [facility] DIA [name]. 08/14/2022 2:00 PM Medical Record Number: [id number] Patient Account Number: [id number] Date of Birth/Sex: Treating RN: June 14, 1941 (81 y.o. [name] Primary Care Kingslee Dowse: Tracie Harrier Other Clinician: Referring Hortensia Duffin: Treating Dellene Mcgroarty/Extender: Solon Palm Weeks in Treatment: 3 Compression Therapy Performed for Wound Assessment: Wound #5 Left,Posterior Lower Leg Performed By: Clinician Carlene Coria, RN Compression Type: Three Layer Post Procedure Diagnosis Same as Pre-procedure Electronic Signature(s) Signed: 08/15/2022 11:46:40 AM By: Carlene Coria RN Entered By: Carlene Coria on 08/14/2022 14:35:33 -------------------------------------------------------------------------------- Encounter Discharge Information Details Patient Name: Date of Service: [facility], DIA [name]. 08/14/2022 2:00 PM Medical Record Number: [id number]  Patient Account Number: [id number] Date of Birth/Sex: Treating RN: October 25, 1941 (81 y.o. [name] Primary Care Rodman Recupero: Tracie Harrier Other Clinician: Referring Nyjah Schwake: Treating Landis Dowdy/Extender: Solon Palm Weeks in Treatment: 3 Encounter Discharge Information Items Discharge Condition: Stable [name], [name] [name] ([id number]) [id number] Page 5 of 13 Ambulatory Status: Wheelchair Discharge Destination: Home Transportation: Private Auto Accompanied By: husband Schedule Follow-up Appointment: Yes Clinical Summary of Care: Electronic Signature(s) Signed: 08/15/2022 11:46:40 AM By: Carlene Coria RN Entered By: Carlene Coria on 08/14/2022 14:36:03 -------------------------------------------------------------------------------- Lower Extremity Assessment Details Patient Name: Date of Service: [facility], DIA [name]. 08/14/2022 2:00 PM Medical Record Number: [id number] Patient Account Number: [id number] Date of Birth/Sex: Treating RN: 23-Mar-1941 (81 y.o. [name] Primary Care Corianna Avallone: Tracie Harrier Other Clinician: Referring Shrey Boike: Treating Commodore Bellew/Extender: Solon Palm Weeks in Treatment:  3 Edema Assessment Assessed: [Left: No] [Right: No] [Left: Edema] [Right: :] Calf Left: Right: Point of Measurement: 30 cm From Medial Instep 36 cm 29 cm Ankle Left: Right: Point of Measurement: 10 cm From Medial Instep 22.3 cm 21 cm Knee To Floor Left: Right: From Medial Instep 36 cm 36 cm Vascular Assessment Pulses: Dorsalis Pedis Palpable: [Left:Yes] [Right:Yes] Electronic Signature(s) Signed: 08/15/2022 11:46:40 AM By: Carlene Coria RN Entered By: Carlene Coria on 08/14/2022 14:09:40 [name], [name] ([id number]) [id number] Page 6 of 13 -------------------------------------------------------------------------------- Multi Wound Chart Details Patient Name: Date of Service: [facility], DIA [name]. 08/14/2022 2:00 PM Medical Record Number: [id number] Patient Account Number: [id number] Date of Birth/Sex: Treating RN: January 06, 1941 (81 y.o. [name] Primary Care Dominick Morella: Tracie Harrier Other Clinician: Referring Julie Nay: Treating Benedetta Sundstrom/Extender: Nadara Mustard, Vishwanath Weeks in Treatment: 3 Vital Signs Height(in): 59 Pulse(bpm): 84 Weight(lbs): 122 Blood Pressure(mmHg): 137/76 Body Mass Index(BMI): 24.6 Temperature(F): 98.2 Respiratory Rate(breaths/min): 18 [3:Photos:] Right, Lateral Lower Leg Left, Midline, Anterior Lower Leg Left, Posterior Lower Leg Wound Location: Gradually Appeared Gradually Appeared Gradually Appeared Wounding Event: Venous Leg Ulcer Venous Leg Ulcer Venous Leg Ulcer Primary Etiology: Diabetic Wound/Ulcer of the Lower Diabetic Wound/Ulcer of the Lower Diabetic Wound/Ulcer of the Lower Secondary Etiology: Extremity Extremity Extremity Cataracts, Lymphedema, Cataracts, Lymphedema, Cataracts, Lymphedema, Comorbid History: Hypertension, Peripheral Arterial Hypertension, Peripheral Arterial Hypertension, Peripheral Arterial Disease, Peripheral Venous Disease, Disease, Peripheral Venous Disease, Disease,  Peripheral Venous Disease, Type II Diabetes, Osteoarthritis, Type II Diabetes, Osteoarthritis, Type II Diabetes, Osteoarthritis, Neuropathy Neuropathy Neuropathy 05/14/2022 05/14/2022 05/14/2022 Date Acquired: '3 3 3 '$ Weeks of Treatment: Open Open Open Wound Status: No No No Wound Recurrence: 1.5x1x0.1 7x2.5x0.1 1x1.2x0.1 Measurements [name] x W x D (cm) 1.178 13.744 0.942 A (cm) : rea 0.118 1.374 0.094 Volume (cm) : 21.90% 72.20% 66.70% % Reduction in Area: 60.90% 86.10% 66.80% % Reduction in Volume: Full Thickness Without Exposed Full Thickness Without Exposed Full Thickness Without Exposed Classification: Support Structures Support Structures Support Structures Medium Medium Medium Exudate Amount: Serosanguineous Serosanguineous Serosanguineous Exudate Type: red, brown red, brown red, brown Exudate Color: Indistinct, nonvisible Indistinct, nonvisible Indistinct, nonvisible Wound Margin: Medium (34-66%) Medium (34-66%) Medium (34-66%) Granulation Amount: Pink Pink N/A Granulation Quality: Medium (34-66%) Medium (34-66%) Medium (34-66%) Necrotic Amount: Fat Layer (Subcutaneous Tissue): Yes Fat Layer (Subcutaneous Tissue): Yes Fat Layer (Subcutaneous Tissue): Yes Exposed Structures: Fascia: No Fascia: No Fascia: No Tendon: No Tendon: No Tendon: No Muscle: No Muscle: No Muscle: No Joint: No Joint: No Joint: No Bone: No Bone: No Bone: No None None None Epithelialization: Treatment Notes Electronic Signature(s) [name], [name] ([id number]) [id number] Page 7 of 13 Signed: 08/15/2022 11:46:40 AM By:  Carlene Coria RN Entered By: Carlene Coria on 08/14/2022 14:12:02 -------------------------------------------------------------------------------- Multi-Disciplinary Care Plan Details Patient Name: Date of Service: [facility], [place] [name]. 08/14/2022 2:00 PM Medical Record Number: [id number] Patient Account Number: [id number] Date of Birth/Sex:  Treating RN: [birth year] (81 y.o. [name] Primary Care Raequan Vanschaick: Tracie Harrier Other Clinician: Referring Elianys Conry: Treating Yue Glasheen/Extender: Solon Palm Weeks in Treatment: 3 Active Inactive Electronic Signature(s) Signed: 09/14/2022 7:29:18 AM By: Gretta Cool, BSN, RN, CWS, Kim RN, BSN Signed: 10/11/2022 4:42:13 PM By: Carlene Coria RN Previous Signature: 08/15/2022 11:46:40 AM Version By: Carlene Coria RN Entered By: Gretta Cool BSN, RN, CWS, Kim on 09/14/2022 07:29:18 -------------------------------------------------------------------------------- Pain Assessment Details Patient Name: Date of Service: [facility], DIA [name]. 08/14/2022 2:00 PM Medical Record Number: [id number] Patient Account Number: [id number] Date of Birth/Sex: Treating RN: 09-13-1941 (81 y.o. [name] Primary Care Reymundo Winship: Tracie Harrier Other Clinician: Referring Roper Tolson: Treating Grantley Savage/Extender: Solon Palm Weeks in Treatment: 3 Active Problems Location of Pain Severity and Description of Pain Patient Has Paino No Site Locations VANGIE, HENTHORN [name] ([id number]) [phone].pdf Page 8 of 13 Pain Management and Medication Current Pain Management: Electronic Signature(s) Signed: 08/15/2022 11:46:40 AM By: Carlene Coria RN Entered By: Carlene Coria on 08/14/2022 14:01:27 -------------------------------------------------------------------------------- Patient/Caregiver Education Details Patient Name: Date of Service: [facility], DIA [name]. 10/3/2023andnbsp2:00 PM Medical Record Number: [id number] Patient Account Number: [id number] Date of Birth/Gender: Treating RN: April 25, 1941 (81 y.o. [name] Primary Care Physician: Tracie Harrier Other Clinician: Referring Physician: Treating Physician/Extender: Solon Palm Weeks in Treatment: 3 Education Assessment Education Provided To: Patient Education Topics  Provided Wound/Skin Impairment: Methods: Explain/Verbal Responses: State content correctly Electronic Signature(s) Signed: 08/15/2022 11:46:40 AM By: Carlene Coria RN Entered By: Carlene Coria on 08/14/2022 14:34:49 Cozzens, [name] ([id number]) [id number].pdf Page 9 of 13 -------------------------------------------------------------------------------- Wound Assessment Details Patient Name: Date of Service: [facility], DIA [name]. 08/14/2022 2:00 PM Medical Record Number: [id number] Patient Account Number: [id number] Date of Birth/Sex: Treating RN: 09-08-1941 (81 y.o. [name] Primary Care Eren Ryser: Tracie Harrier Other Clinician: Referring Kailash Hinze: Treating Shadiyah Wernli/Extender: Solon Palm Weeks in Treatment: 3 Wound Status Wound Number: 3 Primary Venous Leg Ulcer Etiology: Wound Location: Right, Lateral Lower Leg Secondary Diabetic Wound/Ulcer of the Lower Extremity Wounding Event: Gradually Appeared Etiology: Date Acquired: 05/14/2022 Wound Healed - Epithelialized Weeks Of Treatment: 3 Status: Clustered Wound: No Comorbid Cataracts, Lymphedema, Hypertension, Peripheral Arterial History: Disease, Peripheral Venous Disease, Type II Diabetes, Osteoarthritis, Neuropathy Photos Wound Measurements Length: (cm) Width: (cm) Depth: (cm) Area: (cm) Volume: (cm) 0 % Reduction in Area: 21.9% 0 % Reduction in Volume: 60.9% 0 Epithelialization: None 0 Tunneling: No 0 Undermining: No Wound Description Classification: Full Thickness Without Exposed Suppor Wound Margin: Indistinct, nonvisible Exudate Amount: Medium Exudate Type: Serosanguineous Exudate Color: red, brown t Structures Foul Odor After Cleansing: No Slough/Fibrino Yes Wound Bed Granulation Amount: Medium (34-66%) Exposed Structure Granulation Quality: Pink Fascia Exposed: No Necrotic Amount: Medium (34-66%) Fat Layer (Subcutaneous Tissue) Exposed: Yes Tendon  Exposed: No Muscle Exposed: No Joint Exposed: No Bone Exposed: No Treatment Notes Wound #3 (Lower Leg) Wound Laterality: Right, Lateral Cleanser Byram Ancillary Kit - [address] Day Supply SUMMERLYN, FICKEL [name] ([id number]) [phone].pdf Page 10 of 13 Discharge Instruction: Use supplies as instructed; Kit contains: (15) Saline Bullets; (15) 3x3 Gauze; 15 pr Gloves Peri-Wound Care Topical Gentamicin Discharge Instruction: Apply as directed by Dreyah Montrose. Primary Dressing Silvercel Small 2x2 (in/in) Discharge Instruction: Apply Silvercel Small 2x2 (in/in) as instructed Secondary  Dressing ABD Pad 5x9 (in/in) Discharge Instruction: Cover with ABD pad Secured With Compression Wrap 3-LAYER WRAP - Profore Lite LF 3 Multilayer Compression Bandaging System Discharge Instruction: Apply 3 multi-layer wrap as prescribed. Compression Stockings Add-Ons Electronic Signature(s) Signed: 09/20/2022 5:19:54 PM By: Gretta Cool, BSN, RN, CWS, Kim RN, BSN Previous Signature: 08/15/2022 11:46:40 AM Version By: Carlene Coria RN Entered By: Gretta Cool, BSN, RN, CWS, Kim on 09/14/2022 41:74:08 -------------------------------------------------------------------------------- Wound Assessment Details Patient Name: Date of Service: [facility], DIA [name]. 08/14/2022 2:00 PM Medical Record Number: [id number] Patient Account Number: [id number] Date of Birth/Sex: Treating RN: [birth year] (81 y.o. [name] Primary Care Erminie Foulks: Tracie Harrier Other Clinician: Referring Teresa Lemmerman: Treating Claud Gowan/Extender: Solon Palm Weeks in Treatment: 3 Wound Status Wound Number: 4 Primary Venous Leg Ulcer Etiology: Wound Location: Left, Midline, Anterior Lower Leg Secondary Diabetic Wound/Ulcer of the Lower Extremity Wounding Event: Gradually Appeared Etiology: Date Acquired: 05/14/2022 Wound Healed - Epithelialized Weeks Of Treatment: 3 Status: Clustered Wound: No Comorbid Cataracts,  Lymphedema, Hypertension, Peripheral Arterial History: Disease, Peripheral Venous Disease, Type II Diabetes, Osteoarthritis, Neuropathy Photos [name], [name] ([id number]) [phone].pdf Page 11 of 13 Wound Measurements Length: (cm) Width: (cm) Depth: (cm) Area: (cm) Volume: (cm) 0 % Reduction in Area: 72.2% 0 % Reduction in Volume: 86.1% 0 Epithelialization: None 0 Tunneling: No 0 Undermining: No Wound Description Classification: Full Thickness Without Exposed Suppor Wound Margin: Indistinct, nonvisible Exudate Amount: Medium Exudate Type: Serosanguineous Exudate Color: red, brown t Structures Foul Odor After Cleansing: No Slough/Fibrino Yes Wound Bed Granulation Amount: Medium (34-66%) Exposed Structure Granulation Quality: Pink Fascia Exposed: No Necrotic Amount: Medium (34-66%) Fat Layer (Subcutaneous Tissue) Exposed: Yes Tendon Exposed: No Muscle Exposed: No Joint Exposed: No Bone Exposed: No Treatment Notes Wound #4 (Lower Leg) Wound Laterality: Left, Midline, Anterior Cleanser Byram Ancillary Kit - 15 Day Supply Discharge Instruction: Use supplies as instructed; Kit contains: (15) Saline Bullets; (15) 3x3 Gauze; 15 pr Gloves Peri-Wound Care Topical Gentamicin Discharge Instruction: Apply as directed by Tashira Torre. Primary Dressing Silvercel Small 2x2 (in/in) Discharge Instruction: Apply Silvercel Small 2x2 (in/in) as instructed Secondary Dressing ABD Pad 5x9 (in/in) Discharge Instruction: Cover with ABD pad Secured With Compression Wrap 3-LAYER WRAP - Profore Lite LF 3 Multilayer Compression Bandaging System Discharge Instruction: Apply 3 multi-layer wrap as prescribed. Compression Stockings Environmental education officer) Signed: 09/20/2022 5:19:54 PM By: Gretta Cool, BSN, RN, CWS, Kim RN, BSN Previous Signature: 08/15/2022 11:46:40 AM Version By: Carlene Coria RN Entered By: Gretta Cool, BSN, RN, CWS, Kim on 09/14/2022 66:29:47 [name] ([id number]) [id number] Page 12 of 13 -------------------------------------------------------------------------------- Wound Assessment Details Patient Name: Date of Service: [facility], DIA [name]. 08/14/2022 2:00 PM Medical Record Number: [id number] Patient Account Number: [id number] Date of Birth/Sex: Treating RN: 01-26-1941 (81 y.o. [name] Primary Care Chisom Muntean: Tracie Harrier Other Clinician: Referring Bethann Qualley: Treating Chigozie Basaldua/Extender: Solon Palm Weeks in Treatment: 3 Wound Status Wound Number: 5 Primary Venous Leg Ulcer Etiology: Wound Location: Left, Posterior Lower Leg Secondary Diabetic Wound/Ulcer of the Lower Extremity Wounding Event: Gradually Appeared Etiology: Date Acquired: 05/14/2022 Wound Healed - Epithelialized Weeks Of Treatment: 3 Status: Clustered Wound: No Comorbid Cataracts, Lymphedema, Hypertension, Peripheral Arterial History: Disease, Peripheral Venous Disease, Type II Diabetes, Osteoarthritis, Neuropathy Photos Wound Measurements Length: (cm) Width: (cm) Depth: (cm) Area: (cm) Volume: (cm) 0 % Reduction in Area: 66.7% 0 % Reduction in Volume: 66.8% 0 Epithelialization: None 0 Tunneling: No 0 Undermining: No Wound Description Classification: Full Thickness Without Exposed Support Wound Margin: Indistinct,  nonvisible Exudate Amount: Medium Exudate Type: Serosanguineous Exudate Color: red, brown Structures Foul Odor After Cleansing: No Slough/Fibrino No Wound Bed Granulation Amount: Medium (34-66%) Exposed Structure Necrotic Amount: Medium (34-66%) Fascia Exposed: No Fat Layer (Subcutaneous Tissue) Exposed: Yes Tendon Exposed: No Muscle Exposed: No Joint Exposed: No Bone Exposed: No Treatment Notes Wound #5 (Lower Leg) Wound Laterality: Left, Posterior Cleanser Byram Ancillary Kit - [address] Supply [name], SANE [name] ([id number]) [id number] Page  13 of 13 Discharge Instruction: Use supplies as instructed; Kit contains: (15) Saline Bullets; (15) 3x3 Gauze; 15 pr Gloves Peri-Wound Care Topical Gentamicin Discharge Instruction: Apply as directed by Mercia Dowe. Primary Dressing Silvercel Small 2x2 (in/in) Discharge Instruction: Apply Silvercel Small 2x2 (in/in) as instructed Secondary Dressing ABD Pad 5x9 (in/in) Discharge Instruction: Cover with ABD pad Secured With Compression Wrap 3-LAYER WRAP - Profore Lite LF 3 Multilayer Compression Bandaging System Discharge Instruction: Apply 3 multi-layer wrap as prescribed. Compression Stockings Environmental education officer) Signed: 09/20/2022 5:19:54 PM By: Gretta Cool, BSN, RN, CWS, Kim RN, BSN Previous Signature: 08/15/2022 11:46:40 AM Version By: Carlene Coria RN Entered By: Gretta Cool, BSN, RN, CWS, Kim on 09/14/2022 15:05:69 -------------------------------------------------------------------------------- Vitals Details Patient Name: Date of Service: [facility], DIA [name]. 08/14/2022 2:00 PM Medical Record Number: [id number] Patient Account Number: [id number] Date of Birth/Sex: Treating RN: October 17, 1941 (81 y.o. [name] Primary Care Latysha Thackston: Tracie Harrier Other Clinician: Referring Jacquan Savas: Treating Casimir Barcellos/Extender: Solon Palm Weeks in Treatment: 3 Vital Signs Time Taken: 14:01 Temperature (F): 98.2 Height (in): 59 Pulse (bpm): 84 Weight (lbs): 122 Respiratory Rate (breaths/min): 18 Body Mass Index (BMI): 24.6 Blood Pressure (mmHg): 137/76 Reference Range: 80 - 120 mg / dl Electronic Signature(s) Signed: 08/15/2022 11:46:40 AM By: Carlene Coria RN Entered By: Carlene Coria on 08/14/2022 14:01:20

## 2022-08-16 ENCOUNTER — Other Ambulatory Visit: Payer: Self-pay

## 2022-08-16 ENCOUNTER — Encounter: Payer: Self-pay | Admitting: Internal Medicine

## 2022-08-16 ENCOUNTER — Inpatient Hospital Stay
Admission: EM | Admit: 2022-08-16 | Discharge: 2022-08-18 | DRG: 603 | Disposition: A | Discharge status: Home / Self Care | Payer: PPO | Attending: Internal Medicine | Admitting: Internal Medicine

## 2022-08-16 DIAGNOSIS — L03119 Cellulitis of unspecified part of limb: Secondary | ICD-10-CM | POA: Diagnosis present

## 2022-08-16 DIAGNOSIS — Z7984 Long term (current) use of oral hypoglycemic drugs: Secondary | ICD-10-CM

## 2022-08-16 DIAGNOSIS — Z79899 Other long term (current) drug therapy: Secondary | ICD-10-CM

## 2022-08-16 DIAGNOSIS — I110 Hypertensive heart disease with heart failure: Secondary | ICD-10-CM | POA: Diagnosis present

## 2022-08-16 DIAGNOSIS — Z79890 Hormone replacement therapy (postmenopausal): Secondary | ICD-10-CM

## 2022-08-16 DIAGNOSIS — M109 Gout, unspecified: Secondary | ICD-10-CM | POA: Diagnosis present

## 2022-08-16 DIAGNOSIS — Z6824 Body mass index (BMI) 24.0-24.9, adult: Secondary | ICD-10-CM

## 2022-08-16 DIAGNOSIS — M79604 Pain in right leg: Secondary | ICD-10-CM

## 2022-08-16 DIAGNOSIS — M79605 Pain in left leg: Secondary | ICD-10-CM

## 2022-08-16 DIAGNOSIS — E43 Unspecified severe protein-calorie malnutrition: Secondary | ICD-10-CM | POA: Insufficient documentation

## 2022-08-16 DIAGNOSIS — T364X5A Adverse effect of tetracyclines, initial encounter: Secondary | ICD-10-CM | POA: Diagnosis present

## 2022-08-16 DIAGNOSIS — Z888 Allergy status to other drugs, medicaments and biological substances status: Secondary | ICD-10-CM

## 2022-08-16 DIAGNOSIS — E7800 Pure hypercholesterolemia, unspecified: Secondary | ICD-10-CM | POA: Diagnosis present

## 2022-08-16 DIAGNOSIS — I5032 Chronic diastolic (congestive) heart failure: Secondary | ICD-10-CM | POA: Diagnosis present

## 2022-08-16 DIAGNOSIS — Z882 Allergy status to sulfonamides status: Secondary | ICD-10-CM

## 2022-08-16 DIAGNOSIS — E440 Moderate protein-calorie malnutrition: Secondary | ICD-10-CM | POA: Diagnosis present

## 2022-08-16 DIAGNOSIS — R197 Diarrhea, unspecified: Secondary | ICD-10-CM | POA: Diagnosis present

## 2022-08-16 DIAGNOSIS — E039 Hypothyroidism, unspecified: Secondary | ICD-10-CM | POA: Diagnosis not present

## 2022-08-16 DIAGNOSIS — Z923 Personal history of irradiation: Secondary | ICD-10-CM

## 2022-08-16 DIAGNOSIS — D509 Iron deficiency anemia, unspecified: Secondary | ICD-10-CM | POA: Diagnosis present

## 2022-08-16 DIAGNOSIS — Z791 Long term (current) use of non-steroidal anti-inflammatories (NSAID): Secondary | ICD-10-CM

## 2022-08-16 DIAGNOSIS — E1151 Type 2 diabetes mellitus with diabetic peripheral angiopathy without gangrene: Secondary | ICD-10-CM | POA: Diagnosis present

## 2022-08-16 DIAGNOSIS — Z853 Personal history of malignant neoplasm of breast: Secondary | ICD-10-CM

## 2022-08-16 DIAGNOSIS — I10 Essential (primary) hypertension: Secondary | ICD-10-CM | POA: Diagnosis not present

## 2022-08-16 DIAGNOSIS — B9562 Methicillin resistant Staphylococcus aureus infection as the cause of diseases classified elsewhere: Secondary | ICD-10-CM | POA: Diagnosis present

## 2022-08-16 DIAGNOSIS — L03116 Cellulitis of left lower limb: Secondary | ICD-10-CM | POA: Diagnosis not present

## 2022-08-16 DIAGNOSIS — L03115 Cellulitis of right lower limb: Secondary | ICD-10-CM | POA: Diagnosis present

## 2022-08-16 DIAGNOSIS — E119 Type 2 diabetes mellitus without complications: Secondary | ICD-10-CM

## 2022-08-16 DIAGNOSIS — Z9011 Acquired absence of right breast and nipple: Secondary | ICD-10-CM

## 2022-08-16 DIAGNOSIS — T368X5A Adverse effect of other systemic antibiotics, initial encounter: Secondary | ICD-10-CM | POA: Diagnosis present

## 2022-08-16 DIAGNOSIS — Z833 Family history of diabetes mellitus: Secondary | ICD-10-CM

## 2022-08-16 DIAGNOSIS — E785 Hyperlipidemia, unspecified: Secondary | ICD-10-CM | POA: Diagnosis present

## 2022-08-16 LAB — URINALYSIS, ROUTINE W REFLEX MICROSCOPIC
Bilirubin Urine: NEGATIVE
Glucose, UA: NEGATIVE mg/dL
Hgb urine dipstick: NEGATIVE
Ketones, ur: NEGATIVE mg/dL
Leukocytes,Ua: NEGATIVE
Nitrite: NEGATIVE
Protein, ur: NEGATIVE mg/dL
Specific Gravity, Urine: 1.004 — ABNORMAL LOW (ref 1.005–1.030)
pH: 5 (ref 5.0–8.0)

## 2022-08-16 LAB — CBC WITH DIFFERENTIAL/PLATELET
Abs Immature Granulocytes: 0.13 10*3/uL — ABNORMAL HIGH (ref 0.00–0.07)
Basophils Absolute: 0.1 10*3/uL (ref 0.0–0.1)
Basophils Relative: 1 %
Eosinophils Absolute: 0.1 10*3/uL (ref 0.0–0.5)
Eosinophils Relative: 1 %
HCT: 32.7 % — ABNORMAL LOW (ref 36.0–46.0)
Hemoglobin: 9.7 g/dL — ABNORMAL LOW (ref 12.0–15.0)
Immature Granulocytes: 1 %
Lymphocytes Relative: 26 %
Lymphs Abs: 2.5 10*3/uL (ref 0.7–4.0)
MCH: 25.9 pg — ABNORMAL LOW (ref 26.0–34.0)
MCHC: 29.7 g/dL — ABNORMAL LOW (ref 30.0–36.0)
MCV: 87.2 fL (ref 80.0–100.0)
Monocytes Absolute: 0.5 10*3/uL (ref 0.1–1.0)
Monocytes Relative: 5 %
Neutro Abs: 6.4 10*3/uL (ref 1.7–7.7)
Neutrophils Relative %: 66 %
Platelets: 367 10*3/uL (ref 150–400)
RBC: 3.75 MIL/uL — ABNORMAL LOW (ref 3.87–5.11)
RDW: 19.7 % — ABNORMAL HIGH (ref 11.5–15.5)
WBC: 9.6 10*3/uL (ref 4.0–10.5)
nRBC: 0 % (ref 0.0–0.2)

## 2022-08-16 LAB — SEDIMENTATION RATE: Sed Rate: 49 mm/hr — ABNORMAL HIGH (ref 0–30)

## 2022-08-16 LAB — COMPREHENSIVE METABOLIC PANEL
ALT: 9 U/L (ref 0–44)
AST: 18 U/L (ref 15–41)
Albumin: 3.6 g/dL (ref 3.5–5.0)
Alkaline Phosphatase: 54 U/L (ref 38–126)
Anion gap: 9 (ref 5–15)
BUN: 14 mg/dL (ref 8–23)
CO2: 23 mmol/L (ref 22–32)
Calcium: 9.2 mg/dL (ref 8.9–10.3)
Chloride: 107 mmol/L (ref 98–111)
Creatinine, Ser: 0.87 mg/dL (ref 0.44–1.00)
GFR, Estimated: >60 mL/min (ref >60)
Glucose, Bld: 95 mg/dL (ref 70–99)
Potassium: 4.3 mmol/L (ref 3.5–5.1)
Sodium: 139 mmol/L (ref 135–145)
Total Bilirubin: 0.4 mg/dL (ref 0.3–1.2)
Total Protein: 7.3 g/dL (ref 6.5–8.1)

## 2022-08-16 LAB — LACTIC ACID, PLASMA: Lactic Acid, Venous: 1.8 mmol/L (ref 0.5–1.9)

## 2022-08-16 LAB — BRAIN NATRIURETIC PEPTIDE: B Natriuretic Peptide: 57.1 pg/mL (ref 0.0–100.0)

## 2022-08-16 LAB — HEMOGLOBIN A1C
Hgb A1c MFr Bld: 5.7 % — ABNORMAL HIGH (ref 4.8–5.6)
Mean Plasma Glucose: 116.89 mg/dL

## 2022-08-16 LAB — GLUCOSE, CAPILLARY: Glucose-Capillary: 115 mg/dL — ABNORMAL HIGH (ref 70–99)

## 2022-08-16 LAB — C-REACTIVE PROTEIN: CRP: 0.6 mg/dL (ref <1.0)

## 2022-08-16 MED ORDER — SODIUM CHLORIDE 0.9 % IV SOLN
1.0000 g | INTRAVENOUS | Status: DC
  Administered 2022-08-16 – 2022-08-17 (×2): 1 g via INTRAVENOUS
  Filled 2022-08-16: qty 1
  Filled 2022-08-16: qty 10

## 2022-08-16 MED ORDER — HYDRALAZINE HCL 20 MG/ML IJ SOLN: 5.0000 mg | INTRAMUSCULAR | Status: DC | PRN

## 2022-08-16 MED ORDER — INSULIN ASPART 100 UNIT/ML IJ SOLN
0.0000 [IU] | Freq: Three times a day (TID) | INTRAMUSCULAR | Status: DC
  Administered 2022-08-17 (×2): 1 [IU] via SUBCUTANEOUS
  Administered 2022-08-18: 3 [IU] via SUBCUTANEOUS
  Filled 2022-08-16 (×2): qty 1

## 2022-08-16 MED ORDER — HYDROCODONE-ACETAMINOPHEN 7.5-325 MG PO TABS
1.0000 | ORAL_TABLET | Freq: Four times a day (QID) | ORAL | Status: DC | PRN
  Administered 2022-08-17 – 2022-08-18 (×2): 1 via ORAL
  Filled 2022-08-16 (×2): qty 1

## 2022-08-16 MED ORDER — ENOXAPARIN SODIUM 40 MG/0.4ML IJ SOSY
40.0000 mg | PREFILLED_SYRINGE | INTRAMUSCULAR | Status: DC
  Administered 2022-08-16 – 2022-08-17 (×2): 40 mg via SUBCUTANEOUS
  Filled 2022-08-16 (×2): qty 0.4

## 2022-08-16 MED ORDER — VITAMIN B-12 1000 MCG PO TABS
1000.0000 ug | ORAL_TABLET | Freq: Every day | ORAL | Status: DC
  Administered 2022-08-17 – 2022-08-18 (×2): 1000 ug via ORAL
  Filled 2022-08-16 (×2): qty 1

## 2022-08-16 MED ORDER — VANCOMYCIN HCL IN DEXTROSE 1-5 GM/200ML-% IV SOLN
1000.0000 mg | Freq: Once | INTRAVENOUS | Status: AC
  Administered 2022-08-16: 1000 mg via INTRAVENOUS
  Filled 2022-08-16: qty 200

## 2022-08-16 MED ORDER — ACETAMINOPHEN 325 MG PO TABS: 650.0000 mg | ORAL_TABLET | Freq: Four times a day (QID) | ORAL | Status: DC | PRN

## 2022-08-16 MED ORDER — ATENOLOL 25 MG PO TABS
50.0000 mg | ORAL_TABLET | Freq: Every day | ORAL | Status: DC
  Administered 2022-08-17 – 2022-08-18 (×2): 50 mg via ORAL
  Filled 2022-08-16 (×2): qty 2

## 2022-08-16 MED ORDER — ATORVASTATIN CALCIUM 20 MG PO TABS
40.0000 mg | ORAL_TABLET | Freq: Every day | ORAL | Status: DC
  Administered 2022-08-16 – 2022-08-18 (×3): 40 mg via ORAL
  Filled 2022-08-16 (×3): qty 2

## 2022-08-16 MED ORDER — SODIUM CHLORIDE 0.9 % IV SOLN: Freq: Once | INTRAVENOUS | Status: AC

## 2022-08-16 MED ORDER — ALLOPURINOL 300 MG PO TABS
300.0000 mg | ORAL_TABLET | Freq: Every day | ORAL | Status: DC
  Administered 2022-08-17 – 2022-08-18 (×2): 300 mg via ORAL
  Filled 2022-08-16 (×2): qty 1

## 2022-08-16 MED ORDER — ONDANSETRON HCL 4 MG/2ML IJ SOLN
4.0000 mg | Freq: Three times a day (TID) | INTRAMUSCULAR | Status: DC | PRN
  Administered 2022-08-17: 4 mg via INTRAVENOUS
  Filled 2022-08-16: qty 2

## 2022-08-16 MED ORDER — LEVOTHYROXINE SODIUM 88 MCG PO TABS
88.0000 ug | ORAL_TABLET | Freq: Every day | ORAL | Status: DC
  Administered 2022-08-17 – 2022-08-18 (×2): 88 ug via ORAL
  Filled 2022-08-16 (×2): qty 1

## 2022-08-16 MED ORDER — INSULIN ASPART 100 UNIT/ML IJ SOLN: 0.0000 [IU] | Freq: Every day | INTRAMUSCULAR | Status: DC

## 2022-08-16 MED ORDER — FUROSEMIDE 40 MG PO TABS: 40.0000 mg | ORAL_TABLET | Freq: Every day | ORAL | Status: DC

## 2022-08-16 NOTE — Assessment & Plan Note (Signed)
Continue allopurinol 

## 2022-08-16 NOTE — Assessment & Plan Note (Signed)
2D echo on 06/20/2018 showed EF 55 to 60% with grade 1 diastolic dysfunction.  Patient does not have shortness of breath.  No JVD, CHF seem to be compensated. -Check BNP -Hold Lasix due to diarrhea

## 2022-08-16 NOTE — Assessment & Plan Note (Signed)
-  f/u C. difficile and GI pathogen panel

## 2022-08-16 NOTE — Assessment & Plan Note (Signed)
Lipitor 

## 2022-08-16 NOTE — Assessment & Plan Note (Signed)
Recent A1c 6.2, well controlled.  Patient is taking metformin and Januvia at home -Start scale insulin

## 2022-08-16 NOTE — Assessment & Plan Note (Signed)
-   IV hydralazine as needed -Atenolol

## 2022-08-16 NOTE — Assessment & Plan Note (Signed)
Synthroid 

## 2022-08-16 NOTE — ED Notes (Signed)
Per MD, since 1st [facility] was sent before abx dont send second set since abx has been already started.

## 2022-08-16 NOTE — Assessment & Plan Note (Signed)
Hemoglobin 9.7 (at 10.5 on 12/22/18), no active bleeding -Follow-up with CBC

## 2022-08-16 NOTE — ED Triage Notes (Signed)
Pt via POV from home. Pt has been having an on-going infection in her legs that is worse on the L side states that they can't find an abx that she can tolerate, continues to have diarrhea. Pt is A&OX4 and NAD

## 2022-08-16 NOTE — ED Notes (Signed)
Light green top, purple top, blue top, red top, lactic, and 1 set of blood cultures sent to lab at this time by this Probation officer.

## 2022-08-16 NOTE — Consult Note (Signed)
PHARMACY -  BRIEF ANTIBIOTIC NOTE   Pharmacy has received consult(s) for vancomycin from an ED provider.  The patient's profile has been reviewed for ht/wt/allergies/indication/available labs.    One time order(s) placed for vancomycin 1 gram.  Further antibiotics/pharmacy consults should be ordered by admitting physician if indicated.                       Thank you, Lorin Picket 08/16/2022  4:41 PM

## 2022-08-16 NOTE — Assessment & Plan Note (Signed)
Patient could not tolerate doxycycline and Levaquin due to diarrhea at home.  Patient does not have fever or leukocytosis.  Not septic.  - Placed on MedSurg bed for observation - Empiric antimicrobial treatment with Rocephin (patient received 1 dose of vancomycin in ED) - PRN Zofran for nausea, Tylenol and Percocet for pain - Blood cultures x 2  - ESR and CRP - wound care consult - f/u LE doppler to r/o DVT

## 2022-08-16 NOTE — ED Provider Triage Note (Signed)
Emergency Medicine Provider Triage Evaluation Note  [name] , a 81 y.o. female  was evaluated in triage.  Pt complains of draining wound to her left lower leg that has been worsening over the past 2 weeks despite multiple outpatient antibiotics.  She denies any fevers, malaise, nausea, or vomiting.  Review of Systems  Positive: Leg wound with pain and drainage. Negative: Fevers, nausea, vomiting, malaise, chills.  Physical Exam  BP (!) 150/76 (BP Location: Left Arm)   Pulse 81   Temp 98.6 F (37 C)   Resp 20   Ht '4\' 11"'$  (1.499 m)   Wt 55.3 kg   SpO2 97%   BMI 24.64 kg/m  Gen:   Awake, no distress Resp:  Normal effort MSK:   Moves extremities without difficulty, 2+ DP pulses bilaterally.  Medical Decision Making  Medically screening exam initiated at 1:58 PM.  Appropriate orders placed.  [name] was informed that the remainder of the evaluation will be completed by another provider, this initial triage assessment does not replace that evaluation, and the importance of remaining in the ED until their evaluation is complete.   Blake Divine, MD 08/16/22 1359

## 2022-08-16 NOTE — ED Provider Notes (Signed)
[facility] Provider Note    Event Date/Time   First MD Initiated Contact with Patient 08/16/22 1622     (approximate)   History   Wound Infection   HPI  [name] is a 81 y.o. female history of lymphedema and chronic venous stasis ulcers followed at wound clinic with recent diagnosis of worsening cellulitis of the left lower extremity recent dressing changed attempted multiple antibiotics including doxycycline and Levaquin but has had intolerance to this secondary to multiple episodes of diarrhea presents to the ER for worsening leg pain from clinic for IV antibiotics and admission.  Patient denies any abdominal pain.  States she is having 4-5 episodes of diarrhea at night.  No history of C. difficile.  No fevers or chills.     Physical Exam   Triage Vital Signs: ED Triage Vitals  Enc Vitals Group     BP 08/16/22 1344 (!) 150/76     Pulse Rate 08/16/22 1344 81     Resp 08/16/22 1344 20     Temp 08/16/22 1344 98.6 F (37 C)     Temp Source 08/16/22 1617 Oral     SpO2 08/16/22 1344 97 %     Weight 08/16/22 1343 122 lb (55.3 kg)     Height 08/16/22 1343 '4\' 11"'$  (1.499 m)     Head Circumference --      Peak Flow --      Pain Score 08/16/22 1343 7     Pain Loc --      Pain Edu? --      Excl. in [place]? --     Most recent vital signs: Vitals:   08/16/22 1344 08/16/22 1617  BP: (!) 150/76 (!) 135/58  Pulse: 81 69  Resp: 20 18  Temp: 98.6 F (37 C) 98.3 F (36.8 C)  SpO2: 97% 97%     Constitutional: Alert  Eyes: Conjunctivae are normal.  Head: Atraumatic. Nose: No congestion/rhinnorhea. Mouth/Throat: Mucous membranes are moist.   Neck: Painless ROM.  Cardiovascular:   Good peripheral circulation. Respiratory: Normal respiratory effort.  No retractions.  Gastrointestinal: Soft and nontender.  Musculoskeletal: Bilateral lower extremities are wrapped left leg with cellulitic changes with some purulent drainage from chronic appearing  ulceration.  Leg is warm.  No crepitus no blistering. Neurologic:  MAE spontaneously. No gross focal neurologic deficits are appreciated.  Skin:  Skin is warm, dry and intact. No rash noted. Psychiatric: Mood and affect are normal. Speech and behavior are normal.    ED Results / Procedures / Treatments   Labs (all labs ordered are listed, but only abnormal results are displayed) Labs Reviewed  CBC WITH DIFFERENTIAL/PLATELET - Abnormal; Notable for the following components:      Result Value   RBC 3.75 (*)    Hemoglobin 9.7 (*)    HCT 32.7 (*)    MCH 25.9 (*)    MCHC 29.7 (*)    RDW 19.7 (*)    Abs Immature Granulocytes 0.13 (*)    All other components within normal limits  C DIFFICILE QUICK SCREEN W PCR REFLEX    LACTIC ACID, PLASMA  COMPREHENSIVE METABOLIC PANEL  URINALYSIS, ROUTINE W REFLEX MICROSCOPIC     EKG     RADIOLOGY    PROCEDURES:  Critical Care performed: no  Procedures   MEDICATIONS ORDERED IN ED: Medications  0.9 %  sodium chloride infusion (has no administration in time range)     IMPRESSION / MDM / ASSESSMENT  AND PLAN / ED COURSE  I reviewed the triage vital signs and the nursing notes.                              Differential diagnosis includes, but is not limited to, cellulitis, venous stasis, erythroderma, lymphedema  Patient presenting to the ER for evaluation of symptoms as described above.  Base on symptoms, risk factors and considered above differential, this presenting complaint could reflect a potentially life-threatening illness therefore the patient will be placed on continuous pulse oximetry and telemetry for monitoring.  Laboratory evaluation will be sent to evaluate for the above complaints.  Blood work is reassuring no white count no lactate no fever.  Based on her failing outpatient antibiotics and intolerances will order dose of IV vancomycin.  Will consult hospitalist for admission for IV fluids and IV antibiotics and  observation.  Patient agreeable to plan.      FINAL CLINICAL IMPRESSION(S) / ED DIAGNOSES   Final diagnoses:  Cellulitis of left lower extremity     Rx / DC Orders   ED Discharge Orders     None        Note:  This document was prepared using Dragon voice recognition software and may include unintentional dictation errors.    Merlyn Lot, MD 08/16/22 [phone]

## 2022-08-16 NOTE — H&P (Signed)
History and Physical    [name] DOB: 13-Mar-1941 DOA: 08/16/2022  Referring MD/NP/PA:   PCP: Tracie Harrier, MD   Patient coming from:  The patient is coming from home.    Chief Complaint: Bilateral lower leg pain and diarrhea  HPI: [name] is a 81 y.o. female with medical history significant of hypertension, hyperlipidemia, diabetes mellitus, hypothyroidism, gout, breast cancer (s/p radiation and mastectomy), PVD, iron deficiency anemia, dCHF, who presents with bilateral lower leg pain.  Patient states that she has a bilateral lower leg pain due to cellulitis for more than 2 weeks.  Patient was initially started on doxycycline. She developed diarrhea, then was switched to Levaquin, but she continues to have diarrhea after started Levaquin.  She states that she has diarrhea for more than 7 days.  She has had more than 5 times of watery diarrhea since yesterday.  No nausea, vomiting, abdominal pain.  No fever or chills.  Patient does not have chest pain, cough, shortness of breath.  She has some mild tenderness in both legs.  Both legs are erythematous, left leg is worse than the right.   Data reviewed independently and ED Course: pt was found to have WBC 9.6, GFR> 60, temperature normal, blood pressure 135/58, heart rate 81, RR 20, oxygen saturation 97% on room air.  Patient is placed on MedSurg bed for completion.   EKG:   Not done in ED, will get one.     Review of Systems:   General: no fevers, chills, no body weight gain, has fatigue HEENT: no blurry vision, hearing changes or sore throat Respiratory: no dyspnea, coughing, wheezing CV: no chest pain, no palpitations GI: no nausea, vomiting, abdominal pain, has diarrhea GU: no dysuria, burning on urination, increased urinary frequency, hematuria  Ext: has bilateral leg swelling and pain Neuro: no unilateral weakness, numbness, or tingling, no vision change or hearing loss Skin: no rash, no  skin tear. MSK: No muscle spasm, no deformity, no limitation of range of movement in spin Heme: No easy bruising.  Travel history: No recent long distant travel.   Allergy:  Allergies  Allergen Reactions   Other Anaphylaxis    Anesthisia has been a health issue for her in the past.    Sulfa Antibiotics Rash    Past Medical History:  Diagnosis Date   Arthritis    Arthritis    Breast cancer ([place]) 1992   right breast with lumpectomy and rad tx   Cancer of right breast ([place]) 04/16/2013   right breast with mastectomy   Diabetes mellitus without complication ([place])    Gout    High cholesterol    Hyperlipidemia    Hypertension    Personal history of radiation therapy     Past Surgical History:  Procedure Laterality Date   ABDOMINAL HYSTERECTOMY     ANKLE FRACTURE SURGERY Right 2004   BREAST LUMPECTOMY Right 1992   positive   MASTECTOMY Right 2014   SHOULDER SURGERY Right 2010    Social History:  reports that she has never smoked. She has never used smokeless tobacco. She reports that she does not drink alcohol and does not use drugs.  Family History:  Family History  Problem Relation Age of Onset   Diabetes Father    Breast cancer Neg Hx      Prior to Admission medications   Medication Sig Start Date End Date Taking? Authorizing Provider  acetaminophen (TYLENOL) 500 MG tablet Take 500 mg by mouth  QID.    [provider]  alendronate (FOSAMAX) 70 MG tablet Take 70 mg by mouth once a week.     [provider]  allopurinol (ZYLOPRIM) 300 MG tablet Take 300 mg by mouth daily. 08/08/18   [provider]  atenolol (TENORMIN) 50 MG tablet Take 50 mg by mouth daily.     [provider]  atorvastatin (LIPITOR) 40 MG tablet Take 40 mg by mouth daily.  08/09/15   [provider]  diclofenac Sodium (VOLTAREN) 1 % GEL Apply topically. 05/23/20   [provider]  FERREX 150 150 MG capsule Take 150 mg by mouth daily. 06/20/18    [provider]  furosemide (LASIX) 40 MG tablet Take 40 mg by mouth daily.    [provider]  HYDROcodone-acetaminophen (NORCO) 7.5-325 MG tablet Take 1 tablet by mouth 2 (two) times daily as needed. 05/20/20   [provider]  levothyroxine (SYNTHROID, LEVOTHROID) 75 MCG tablet Take 75 mcg by mouth daily.  10/10/15   [provider]  lisinopril-hydrochlorothiazide (PRINZIDE,ZESTORETIC) 20-12.5 MG tablet Take 1 tablet by mouth daily.  06/01/14   [provider]  meloxicam (MOBIC) 7.5 MG tablet Take 7.5 mg by mouth daily. 05/09/20   [provider]  metFORMIN (GLUCOPHAGE) 1000 MG tablet Take 1,000 mg by mouth daily with breakfast.  09/29/15   [provider]  vitamin B-12 (CYANOCOBALAMIN) 1000 MCG tablet Take 1,000 mcg by mouth daily.    [provider]  Vitamin D, Ergocalciferol, (DRISDOL) 50000 UNITS CAPS capsule Take 50,000 Units by mouth every 30 (thirty) days.     [provider]    Physical Exam: Vitals:   08/16/22 1343 08/16/22 1344 08/16/22 1617  BP:  (!) 150/76 (!) 135/58  Pulse:  81 69  Resp:  20 18  Temp:  98.6 F (37 C) 98.3 F (36.8 C)  TempSrc:   Oral  SpO2:  97% 97%  Weight: 55.3 kg    Height: $Remove'4\' 11"'BjOivNx$  (1.499 m)     General: Not in acute distress HEENT:       Eyes: PERRL, EOMI, no scleral icterus.       ENT: No discharge from the ears and nose, no pharynx injection, no tonsillar enlargement.        Neck: No JVD, no bruit, no mass felt. Heme: No neck lymph node enlargement. Cardiac: S1/S2, RRR, No murmurs, No gallops or rubs. Respiratory: No rales, wheezing, rhonchi or rubs. GI: Soft, nondistended, nontender, no rebound pain, no organomegaly, BS present. GU: No hematuria Ext: Left leg is examined.  The left leg has erythema, mild swelling, tenderness, with little pus on the surface.     Musculoskeletal: No joint deformities, No joint redness or warmth, no limitation of ROM in spin. Skin:  No rashes.  Neuro: Alert, oriented X3, cranial nerves II-XII grossly intact, moves all extremities normally.  Psych: Patient is not psychotic, no suicidal or hemocidal ideation.  Labs on Admission: I have personally reviewed following labs and imaging studies  CBC: Recent Labs  Lab 08/16/22 1347  WBC 9.6  NEUTROABS 6.4  HGB 9.7*  HCT 32.7*  MCV 87.2  PLT 798   Basic Metabolic Panel: Recent Labs  Lab 08/16/22 1347  NA 139  K 4.3  CL 107  CO2 23  GLUCOSE 95  BUN 14  CREATININE 0.87  CALCIUM 9.2   GFR: Estimated Creatinine Clearance: 39.7 mL/min (by C-G formula based on SCr of 0.87 mg/dL). Liver Function Tests:  Recent Labs  Lab 08/16/22 1347  AST 18  ALT 9  ALKPHOS 54  BILITOT 0.4  PROT 7.3  ALBUMIN 3.6   No results for input(s): "LIPASE", "AMYLASE" in the last 168 hours. No results for input(s): "AMMONIA" in the last 168 hours. Coagulation Profile: No results for input(s): "INR", "PROTIME" in the last 168 hours. Cardiac Enzymes: No results for input(s): "CKTOTAL", "CKMB", "CKMBINDEX", "TROPONINI" in the last 168 hours. BNP (last 3 results) No results for input(s): "PROBNP" in the last 8760 hours. HbA1C: No results for input(s): "HGBA1C" in the last 72 hours. CBG: No results for input(s): "GLUCAP" in the last 168 hours. Lipid Profile: No results for input(s): "CHOL", "HDL", "LDLCALC", "TRIG", "CHOLHDL", "LDLDIRECT" in the last 72 hours. Thyroid Function Tests: No results for input(s): "TSH", "T4TOTAL", "FREET4", "T3FREE", "THYROIDAB" in the last 72 hours. Anemia Panel: No results for input(s): "VITAMINB12", "FOLATE", "FERRITIN", "TIBC", "IRON", "RETICCTPCT" in the last 72 hours. Urine analysis:    Component Value Date/Time   COLORURINE YELLOW 10/17/2018 1059   APPEARANCEUR HAZY (A) 10/17/2018 1059   APPEARANCEUR Cloudy (A) 09/08/2018 1519   LABSPEC 1.010 10/17/2018 1059   LABSPEC 1.016 05/20/2012 1345   PHURINE 5.5 10/17/2018 1059   GLUCOSEU NEGATIVE  10/17/2018 1059   GLUCOSEU Negative 05/20/2012 1345   HGBUR NEGATIVE 10/17/2018 1059   BILIRUBINUR NEGATIVE 10/17/2018 1059   BILIRUBINUR Negative 09/08/2018 1519   BILIRUBINUR Negative 05/20/2012 1345   KETONESUR NEGATIVE 10/17/2018 1059   PROTEINUR NEGATIVE 10/17/2018 1059   NITRITE NEGATIVE 10/17/2018 1059   LEUKOCYTESUR SMALL (A) 10/17/2018 1059   LEUKOCYTESUR 2+ (A) 09/08/2018 1519   LEUKOCYTESUR 3+ 05/20/2012 1345   Sepsis Labs: $RemoveBefo'@LABRCNTIP'PjMTaHIoSMP$ (procalcitonin:4,lacticidven:4) )No results found for this or any previous visit (from the past 240 hour(s)).   Radiological Exams on Admission: No results found.    Assessment/Plan Principal Problem:   Cellulitis of both lower extremities Active Problems:   Diarrhea   Acquired hypothyroidism   Benign essential hypertension   Chronic diastolic CHF (congestive heart failure) (HCC)   Diabetes mellitus without complication (HCC)   Gout   HLD (hyperlipidemia)   Iron deficiency anemia   Assessment and Plan: * Cellulitis of both lower extremities Patient could not tolerate doxycycline and Levaquin due to diarrhea at home.  Patient does not have fever or leukocytosis.  Not septic.  - Placed on MedSurg bed for observation - Empiric antimicrobial treatment with Rocephin (patient received 1 dose of vancomycin in ED) - PRN Zofran for nausea, Tylenol and Percocet for pain - Blood cultures x 2  - ESR and CRP - wound care consult - f/u LE doppler to r/o DVT   Diarrhea -f/u C. difficile and GI pathogen panel  Acquired hypothyroidism - Synthroid  Benign essential hypertension - IV hydralazine as needed -Atenolol  Chronic diastolic CHF (congestive heart failure) ([place]) 2D echo on 06/20/2018 showed EF 55 to 60% with grade 1 diastolic dysfunction.  Patient does not have shortness of breath.  No JVD, CHF seem to be compensated. -Check BNP -Hold Lasix due to diarrhea  Diabetes mellitus without complication (HCC) Recent A1c 6.2, well  controlled.  Patient is taking metformin and Januvia at home -Start scale insulin  Gout - Continue allopurinol  HLD (hyperlipidemia) - Lipitor  Iron deficiency anemia Hemoglobin 9.7 (at 10.5 on 12/22/18), no active bleeding -Follow-up with CBC          DVT ppx:  SQ Lovenox  Code Status: Full code  Family Communication:   Yes, patient's husband  at bed side.    Disposition Plan:  Anticipate discharge back to previous environment  Consults called:  none  Admission status and Level of care: Med-Surg:    for obs      Dispo: The patient is from: Home              Anticipated d/c is to: Home              Anticipated d/c date is: 1 day              Patient currently is not medically stable to d/c.    Severity of Illness:  The appropriate patient status for this patient is OBSERVATION. Observation status is judged to be reasonable and necessary in order to provide the required intensity of service to ensure the patient's safety. The patient's presenting symptoms, physical exam findings, and initial radiographic and laboratory data in the context of their medical condition is felt to place them at decreased risk for further clinical deterioration. Furthermore, it is anticipated that the patient will be medically stable for discharge from the hospital within 2 midnights of admission.        Date of Service 08/16/2022    [facility]   If 7PM-7AM, please contact night-coverage [email or web address] 08/16/2022, 5:48 PM

## 2022-08-17 ENCOUNTER — Inpatient Hospital Stay: Payer: PPO

## 2022-08-17 DIAGNOSIS — B9562 Methicillin resistant Staphylococcus aureus infection as the cause of diseases classified elsewhere: Secondary | ICD-10-CM | POA: Diagnosis present

## 2022-08-17 DIAGNOSIS — E1151 Type 2 diabetes mellitus with diabetic peripheral angiopathy without gangrene: Secondary | ICD-10-CM | POA: Diagnosis present

## 2022-08-17 DIAGNOSIS — E039 Hypothyroidism, unspecified: Secondary | ICD-10-CM | POA: Diagnosis present

## 2022-08-17 DIAGNOSIS — L03116 Cellulitis of left lower limb: Secondary | ICD-10-CM | POA: Diagnosis present

## 2022-08-17 DIAGNOSIS — L03115 Cellulitis of right lower limb: Secondary | ICD-10-CM | POA: Diagnosis present

## 2022-08-17 DIAGNOSIS — Z79890 Hormone replacement therapy (postmenopausal): Secondary | ICD-10-CM | POA: Diagnosis not present

## 2022-08-17 DIAGNOSIS — Z7984 Long term (current) use of oral hypoglycemic drugs: Secondary | ICD-10-CM | POA: Diagnosis not present

## 2022-08-17 DIAGNOSIS — E7800 Pure hypercholesterolemia, unspecified: Secondary | ICD-10-CM | POA: Diagnosis present

## 2022-08-17 DIAGNOSIS — I5032 Chronic diastolic (congestive) heart failure: Secondary | ICD-10-CM | POA: Diagnosis present

## 2022-08-17 DIAGNOSIS — M79604 Pain in right leg: Secondary | ICD-10-CM | POA: Diagnosis present

## 2022-08-17 DIAGNOSIS — Z853 Personal history of malignant neoplasm of breast: Secondary | ICD-10-CM | POA: Diagnosis not present

## 2022-08-17 DIAGNOSIS — Z833 Family history of diabetes mellitus: Secondary | ICD-10-CM | POA: Diagnosis not present

## 2022-08-17 DIAGNOSIS — E440 Moderate protein-calorie malnutrition: Secondary | ICD-10-CM | POA: Diagnosis present

## 2022-08-17 DIAGNOSIS — T364X5A Adverse effect of tetracyclines, initial encounter: Secondary | ICD-10-CM | POA: Diagnosis present

## 2022-08-17 DIAGNOSIS — Z923 Personal history of irradiation: Secondary | ICD-10-CM | POA: Diagnosis not present

## 2022-08-17 DIAGNOSIS — D509 Iron deficiency anemia, unspecified: Secondary | ICD-10-CM | POA: Diagnosis present

## 2022-08-17 DIAGNOSIS — Z791 Long term (current) use of non-steroidal anti-inflammatories (NSAID): Secondary | ICD-10-CM | POA: Diagnosis not present

## 2022-08-17 DIAGNOSIS — Z6824 Body mass index (BMI) 24.0-24.9, adult: Secondary | ICD-10-CM | POA: Diagnosis not present

## 2022-08-17 DIAGNOSIS — R197 Diarrhea, unspecified: Secondary | ICD-10-CM | POA: Diagnosis present

## 2022-08-17 DIAGNOSIS — Z888 Allergy status to other drugs, medicaments and biological substances status: Secondary | ICD-10-CM | POA: Diagnosis not present

## 2022-08-17 DIAGNOSIS — Z9011 Acquired absence of right breast and nipple: Secondary | ICD-10-CM | POA: Diagnosis not present

## 2022-08-17 DIAGNOSIS — I110 Hypertensive heart disease with heart failure: Secondary | ICD-10-CM | POA: Diagnosis present

## 2022-08-17 DIAGNOSIS — Z882 Allergy status to sulfonamides status: Secondary | ICD-10-CM | POA: Diagnosis not present

## 2022-08-17 DIAGNOSIS — L03119 Cellulitis of unspecified part of limb: Secondary | ICD-10-CM | POA: Diagnosis present

## 2022-08-17 DIAGNOSIS — M109 Gout, unspecified: Secondary | ICD-10-CM | POA: Diagnosis present

## 2022-08-17 DIAGNOSIS — Z79899 Other long term (current) drug therapy: Secondary | ICD-10-CM | POA: Diagnosis not present

## 2022-08-17 LAB — GLUCOSE, CAPILLARY
Glucose-Capillary: 92 mg/dL (ref 70–99)
Glucose-Capillary: 122 mg/dL — ABNORMAL HIGH (ref 70–99)
Glucose-Capillary: 149 mg/dL — ABNORMAL HIGH (ref 70–99)
Glucose-Capillary: 110 mg/dL — ABNORMAL HIGH (ref 70–99)

## 2022-08-17 LAB — CBC
HCT: 28.8 % — ABNORMAL LOW (ref 36.0–46.0)
Hemoglobin: 8.6 g/dL — ABNORMAL LOW (ref 12.0–15.0)
MCH: 25.7 pg — ABNORMAL LOW (ref 26.0–34.0)
MCHC: 29.9 g/dL — ABNORMAL LOW (ref 30.0–36.0)
MCV: 86 fL (ref 80.0–100.0)
Platelets: 302 10*3/uL (ref 150–400)
RBC: 3.35 MIL/uL — ABNORMAL LOW (ref 3.87–5.11)
RDW: 19.5 % — ABNORMAL HIGH (ref 11.5–15.5)
WBC: 7.9 10*3/uL (ref 4.0–10.5)
nRBC: 0 % (ref 0.0–0.2)

## 2022-08-17 LAB — MRSA NEXT GEN BY PCR, NASAL: MRSA by PCR Next Gen: DETECTED — AB

## 2022-08-17 MED ORDER — GLUCERNA SHAKE PO LIQD
237.0000 mL | Freq: Three times a day (TID) | ORAL | Status: DC
  Administered 2022-08-17 – 2022-08-18 (×2): 237 mL via ORAL

## 2022-08-17 MED ORDER — INFLUENZA VAC A&B SA ADJ QUAD 0.5 ML IM PRSY
0.5000 mL | PREFILLED_SYRINGE | INTRAMUSCULAR | Status: DC
  Filled 2022-08-17: qty 0.5

## 2022-08-17 MED ORDER — ADULT MULTIVITAMIN W/MINERALS CH
1.0000 | ORAL_TABLET | Freq: Every day | ORAL | Status: DC
  Administered 2022-08-18: 1 via ORAL
  Filled 2022-08-17: qty 1

## 2022-08-17 MED ORDER — VITAMIN C 500 MG PO TABS
500.0000 mg | ORAL_TABLET | Freq: Two times a day (BID) | ORAL | Status: DC
  Administered 2022-08-17 – 2022-08-18 (×2): 500 mg via ORAL
  Filled 2022-08-17 (×2): qty 1

## 2022-08-17 NOTE — Consult Note (Addendum)
[facility] Nurse Consult Note: Reason for Consult: Consult requested for bilat legs.  Performed remotely after review of progress notes and photo in the EMR.   Pt has been followed by the outpatient wound care center prior to admission for chronic full thickness wounds to bilat legs.  She was seen there 3 days ago. She currently is being treated with systemic antibiotics for cellulitis.  They are using silver hydrofiber dressings and light compression.  Wounds are yellow/red and moist and shallow with generalized edema surrounding.  Continue present plan of care ordered by the wound center. Topical treatment orders provided for bedside nurses to perform as follows:  Bedside nurse, please change dressings to bilat legs Q FRI and MON as follows: remove previous dressings/wraps to bilat legs, moisten with NS to assist with removal if needed.  Apply Aquacel Kellie Simmering [phone]) to open wounds, then cover with ABD pads and kerlex, beginning just behind toes, to below knees in a spiral fashion, then ace wrap in the same manner. Pt should resume follow-up with the outpatient wound care center after discharge. Please re-consult if further assistance is needed.  Thank-you,  Julien Girt MSN, [place], [place], [place], [place]

## 2022-08-17 NOTE — Care Management Obs Status (Signed)
[place] NOTIFICATION   Patient Details  Name: [name] MRN: [id number] Date of Birth: 03/26/1941   Medicare Observation Status Notification Given:  Yes    Conception Oms, RN 08/17/2022, 8:53 AM

## 2022-08-17 NOTE — TOC Initial Note (Signed)
Transition of Care [facility]) - Initial/Assessment Note    Patient Details  Name: [name] MRN: [id number] Date of Birth: November 02, 1941  Transition of Care [facility]) CM/SW Contact:    Conception Oms, RN Phone Number: 08/17/2022, 8:54 AM  Clinical Narrative:                  Transition of Care [facility]) Screening Note   Patient Details  Name: [name] Date of Birth: [birth year]   Transition of Care [facility]) CM/SW Contact:    Conception Oms, RN Phone Number: 08/17/2022, 8:54 AM  PCP Dr [name] [facility]  Transition of Care Department [facility]) has reviewed patient and no TOC needs have been identified at this time. We will continue to monitor patient advancement through interdisciplinary progression rounds. If new patient transition needs arise, please place a TOC consult.    Expected Discharge Plan: [place] Barriers to Discharge: Continued Medical Work up   Patient Goals and CMS Choice        Expected Discharge Plan and Services Expected Discharge Plan: [place]       Living arrangements for the past 2 months: Single Family Home                                      Prior Living Arrangements/Services Living arrangements for the past 2 months: Single Family Home Lives with:: Spouse                   Activities of Daily Living Home Assistive Devices/Equipment: Dentures (specify type), Wheelchair ADL Screening (condition at time of admission) Patient's cognitive ability adequate to safely complete daily activities?: Yes Is the patient deaf or have difficulty hearing?: Yes Does the patient have difficulty seeing, even when wearing glasses/contacts?: No Does the patient have difficulty concentrating, remembering, or making decisions?: No Patient able to express need for assistance with ADLs?: Yes Does the patient have difficulty dressing or bathing?: Yes Independently performs ADLs?:  No Communication: Independent Dressing (OT): Needs assistance Is this a change from baseline?: Pre-admission baseline Grooming: Independent Feeding: Independent Bathing: Needs assistance Is this a change from baseline?: Pre-admission baseline Toileting: Needs assistance Is this a change from baseline?: Pre-admission baseline In/Out Bed: Needs assistance Is this a change from baseline?: Pre-admission baseline Walks in Home: Dependent Is this a change from baseline?: Pre-admission baseline Does the patient have difficulty walking or climbing stairs?: Yes Weakness of Legs: Both Weakness of Arms/Hands: None  Permission Sought/Granted                  Emotional Assessment              Admission diagnosis:  Bilateral leg pain [M79.604, M79.605] Cellulitis of left lower extremity [L03.116] Patient Active Problem List   Diagnosis Date Noted   Cellulitis of both lower extremities 08/16/2022   Diabetes mellitus without complication (HCC)    Gout    Chronic diastolic CHF (congestive heart failure) (HCC)    Iron deficiency anemia    Diarrhea    Cervical spondylosis 03/09/2020   Lower limb ulcer, ankle, right, limited to breakdown of skin ([place]) 09/09/2018   Chronic gouty arthritis 04/29/2018   Generalized osteoarthritis of hand 02/12/2018   Hyperuricemia 02/12/2018   Swelling of finger 02/12/2018   PAD (peripheral artery disease) ([place]) 04/30/2017   Lymphedema 04/30/2017  Bilateral edema of lower extremity 04/03/2017   Ecchymosis 04/03/2017   Leg pain, bilateral 04/03/2017   Acquired hypothyroidism 01/24/2017   Benign essential hypertension 01/24/2017   History of mastectomy, right 01/24/2017   Primary osteoarthritis of left knee 02/29/2016   Cancer of right breast ([place]) 04/17/2015   Chronic pain 03/03/2015   Chronic pain of both knees 03/03/2015   Arthritis 01/11/2014   Soft tissue lesion of shoulder region 01/11/2014   Diabetes mellitus, type 2 ([place]) 01/11/2014    H/O renal calculi 01/11/2014   Hypertension 01/11/2014   HLD (hyperlipidemia) 01/11/2014   Neuropathy 01/11/2014   OP (osteoporosis) 01/11/2014   H/O malignant neoplasm of breast 01/11/2014   PCP:  Tracie Harrier, MD Pharmacy:   [place], [place] - [address] [place] [address] Phone: [phone] Fax: [place], [place] - [facility] [address] [facility] [place] [address] Phone: [phone] Fax: [phone]     Social Determinants of Health (SDOH) Interventions    Readmission Risk Interventions     No data to display

## 2022-08-17 NOTE — Progress Notes (Signed)
Date and time results received:  08/17/2022 15:48  Test: MRSA Critical Value: positive  Name of Provider Notified: [facility]  Orders Received? Or Actions Taken?:  n/a

## 2022-08-17 NOTE — Progress Notes (Signed)
Initial Nutrition Assessment  DOCUMENTATION CODES:   Non-severe (moderate) malnutrition in context of chronic illness  INTERVENTION:   Glucerna Shake po TID, each supplement provides 220 kcal and 10 grams of protein  MVI po daily   Vitamin C 566m po BID  Pt at high refeed risk; recommend monitor potassium, magnesium and phosphorus labs daily until stable  NUTRITION DIAGNOSIS:   Moderate Malnutrition related to chronic illness (breast cancer, CHF) as evidenced by moderate fat depletion, moderate muscle depletion.  GOAL:   Patient will meet greater than or equal to 90% of their needs  MONITOR:   PO intake, Supplement acceptance, Labs, Weight trends, Skin, I & O's  REASON FOR ASSESSMENT:   Malnutrition Screening Tool    ASSESSMENT:   81y.o. female with medical history significant of hypertension, hyperlipidemia, diabetes mellitus, hypothyroidism, gout, breast cancer (s/p radiation and mastectomy), PVD, iron deficiency anemia and dCHF who is admitted with BLE cellulitis and diarrhea.  Met with pt in room today. Pt reports fair appetite and oral intake at baseline and in hospital. Pt reports eating ~50% of meals in hospital. Per chart, pt appears weight stable at baseline. RD discussed with pt the importance of adequate nutrition needed to preserve lean muscle. Pt is willing to drink vanilla Glucerna in hospital. RD will add supplements and vitamins to help pt meet her estimated needs and to support wound healing.   Medications reviewed and include: allopurinol, B12, insulin, synthroid, ceftriaxone  Labs reviewed: Hgb 8.6(L), Hct 28.8(L) Cbgs- 122, 92 x 24 hrs AIC 5.7(H)- 10/5  NUTRITION - FOCUSED PHYSICAL EXAM:  Flowsheet Row Most Recent Value  Orbital Region Mild depletion  Upper Arm Region Severe depletion  Thoracic and Lumbar Region Moderate depletion  Buccal Region Moderate depletion  Temple Region Moderate depletion  Clavicle Bone Region Severe depletion   Clavicle and Acromion Bone Region Severe depletion  Scapular Bone Region Moderate depletion  Dorsal Hand Moderate depletion  Patellar Region Severe depletion  Anterior Thigh Region Severe depletion  Posterior Calf Region Severe depletion  Edema (RD Assessment) Mild  Hair Reviewed  Eyes Reviewed  Mouth Reviewed  Skin Reviewed  Nails Reviewed   Diet Order:   Diet Order             Diet regular Room service appropriate? Yes; Fluid consistency: Thin  Diet effective now                  EDUCATION NEEDS:   Education needs have been addressed  Skin:  Skin Assessment: Reviewed RN Assessment  Last BM:  10/5- type 7  Height:   Ht Readings from Last 1 Encounters:  08/16/22 _0  (1.499 m)    Weight:   Wt Readings from Last 1 Encounters:  08/16/22 55.3 kg    Ideal Body Weight:  44.5 kg  BMI:  Body mass index is 24.64 kg/m.  Estimated Nutritional Needs:   Kcal:  1300-1500kcal/day  Protein:  65-75g/day  Fluid:  1.1-1.3L/day  CKoleen DistanceMS, RD, LDN Please refer to [facility] and/or RD on-call/weekend/after hours pager

## 2022-08-17 NOTE — Progress Notes (Signed)
PROGRESS NOTE    [name]  FGH:[id number] DOB: 01/27/1941 DOA: 08/16/2022 PCP: Tracie Harrier, MD    Brief Narrative:  81 y.o. female with medical history significant of hypertension, hyperlipidemia, diabetes mellitus, hypothyroidism, gout, breast cancer (s/p radiation and mastectomy), PVD, iron deficiency anemia, dCHF, who presents with bilateral lower leg pain.   Patient states that she has a bilateral lower leg pain due to cellulitis for more than 2 weeks.  Patient was initially started on doxycycline. She developed diarrhea, then was switched to Levaquin, but she continues to have diarrhea after started Levaquin.  She states that she has diarrhea for more than 7 days.  She has had more than 5 times of watery diarrhea since yesterday.  No nausea, vomiting, abdominal pain.  No fever or chills.  Patient does not have chest pain, cough, shortness of breath.  She has some mild tenderness in both legs.  Both legs are erythematous, left leg is worse than the right.  Patient started on IV Rocephin.  C. difficile assay ordered and pending.  Assessment & Plan:   Principal Problem:   Cellulitis of both lower extremities Active Problems:   Diarrhea   Acquired hypothyroidism   Benign essential hypertension   Chronic diastolic CHF (congestive heart failure) (HCC)   Diabetes mellitus without complication (HCC)   Gout   HLD (hyperlipidemia)   Iron deficiency anemia   Cellulitis of lower extremity  * Cellulitis of both lower extremities Patient could not tolerate doxycycline and Levaquin due to diarrhea at home.  Patient does not have fever or leukocytosis.  Not septic. Plan: Admit to inpatient Continue Rocephin As needed pain control Wound care consult Lower extremity Doppler rule out DVT     Diarrhea -f/u C. difficile and GI pathogen panel, pending   Acquired hypothyroidism - Synthroid   Benign essential hypertension - IV hydralazine as needed -Atenolol   Chronic  diastolic CHF (congestive heart failure) (HCC) 2D echo on 06/20/2018 showed EF 55 to 60% with grade 1 diastolic dysfunction.  Patient does not have shortness of breath.  No JVD, CHF seem to be compensated. -Check BNP, not elevated -Hold Lasix due to diarrhea   Diabetes mellitus without complication (HCC) Recent A1c 6.2, well controlled.  Patient is taking metformin and Januvia at home -Start scale insulin   Gout - Continue allopurinol   HLD (hyperlipidemia) - Lipitor   Iron deficiency anemia Hemoglobin 9.7 (at 10.5 on 12/22/18), no active bleeding No indication for transfusion   DVT prophylaxis: SQ Lovenox Code Status: Full Family Communication: Attempted to call patient's spouse [name].  Number in chart appears to be wrong. Disposition Plan: Status is: Inpatient Remains inpatient appropriate because: Bilateral lower extremity cellulitis on IV antibiotics.  Anticipated date of discharge 10/7.   Level of care: Med-Surg  Consultants:  None  Procedures:  None  Antimicrobials: Ceftriaxone   Subjective: Seen and examined.  Resting in bed.  Endorses pain in bilateral lower extremities, left greater than right.  Objective: Vitals:   08/16/22 1943 08/16/22 2316 08/17/22 0755 08/17/22 0820  BP: 120/63 112/64 (!) 88/48 117/78  Pulse: 84 88 87   Resp: '18 16 17   '$ Temp: 98.9 F (37.2 C) 98.6 F (37 C) 98.7 F (37.1 C)   TempSrc: Oral Oral    SpO2: 98% 97% 94%   Weight:      Height:        Intake/Output Summary (Last 24 hours) at 08/17/2022 1140 Last data filed at 08/17/2022 [phone]  Gross per 24 hour  Intake 100 ml  Output 1000 ml  Net -900 ml   Filed Weights   08/16/22 1343  Weight: 55.3 kg    Examination:  General exam: Appears calm and comfortable  Respiratory system: Diminished breath sound bilaterally.  Normal work of breathing.  Room air Cardiovascular system: S1-S2, RRR, no murmurs, no pedal edema Gastrointestinal system: Soft, NT/ND, normal bowel  sounds Central nervous system: Alert and oriented. No focal neurological deficits. Extremities: Symmetric 5 x 5 power. Skin: The left leg has erythema, mild swelling, tenderness, with little pus on the surface. Psychiatry: Judgement and insight appear normal. Mood & affect appropriate.     Data Reviewed: I have personally reviewed following labs and imaging studies  CBC: Recent Labs  Lab 08/16/22 1347 08/17/22 0433  WBC 9.6 7.9  NEUTROABS 6.4  --   HGB 9.7* 8.6*  HCT 32.7* 28.8*  MCV 87.2 86.0  PLT 367 381   Basic Metabolic Panel: Recent Labs  Lab 08/16/22 1347  NA 139  K 4.3  CL 107  CO2 23  GLUCOSE 95  BUN 14  CREATININE 0.87  CALCIUM 9.2   GFR: Estimated Creatinine Clearance: 39.7 mL/min (by C-G formula based on SCr of 0.87 mg/dL). Liver Function Tests: Recent Labs  Lab 08/16/22 1347  AST 18  ALT 9  ALKPHOS 54  BILITOT 0.4  PROT 7.3  ALBUMIN 3.6   No results for input(s): "LIPASE", "AMYLASE" in the last 168 hours. No results for input(s): "AMMONIA" in the last 168 hours. Coagulation Profile: No results for input(s): "INR", "PROTIME" in the last 168 hours. Cardiac Enzymes: No results for input(s): "CKTOTAL", "CKMB", "CKMBINDEX", "TROPONINI" in the last 168 hours. BNP (last 3 results) No results for input(s): "PROBNP" in the last 8760 hours. HbA1C: Recent Labs    08/16/22 1932  HGBA1C 5.7*   CBG: Recent Labs  Lab 08/16/22 2130 08/17/22 0751  GLUCAP 115* 92   Lipid Profile: No results for input(s): "CHOL", "HDL", "LDLCALC", "TRIG", "CHOLHDL", "LDLDIRECT" in the last 72 hours. Thyroid Function Tests: No results for input(s): "TSH", "T4TOTAL", "FREET4", "T3FREE", "THYROIDAB" in the last 72 hours. Anemia Panel: No results for input(s): "VITAMINB12", "FOLATE", "FERRITIN", "TIBC", "IRON", "RETICCTPCT" in the last 72 hours. Sepsis Labs: Recent Labs  Lab 08/16/22 1347  LATICACIDVEN 1.8    Recent Results (from the past 240 hour(s))  Culture,  blood (Routine X 2) w Reflex to ID Panel     Status: None (Preliminary result)   Collection Time: 08/16/22  1:47 PM   Specimen: BLOOD  Result Value Ref Range Status   Specimen Description BLOOD LEFT ASSIST CONTROL  Final   Special Requests   Final    BOTTLES DRAWN AEROBIC AND ANAEROBIC Blood Culture results may not be optimal due to an excessive volume of blood received in culture bottles   Culture   Final    NO GROWTH < 24 HOURS Performed at [facility], [address] [place], [place] [address]    Report Status PENDING  Incomplete  Culture, blood (Routine X 2) w Reflex to ID Panel     Status: None (Preliminary result)   Collection Time: 08/16/22  7:32 PM   Specimen: BLOOD  Result Value Ref Range Status   Specimen Description BLOOD RIGHT ANTECUBITAL  Final   Special Requests   Final    BOTTLES DRAWN AEROBIC AND ANAEROBIC Blood Culture adequate volume   Culture   Final    NO GROWTH < 12  HOURS Performed at [facility], [address] [place], [place] [address]    Report Status PENDING  Incomplete         Radiology Studies: No results found.      Scheduled Meds:  allopurinol  300 mg Oral Daily   atenolol  50 mg Oral Daily   atorvastatin  40 mg Oral Daily   cyanocobalamin  1,000 mcg Oral Daily   enoxaparin (LOVENOX) injection  40 mg Subcutaneous Q24H   [START ON 08/18/2022] influenza vaccine adjuvanted  0.5 mL Intramuscular Tomorrow-1000   insulin aspart  0-5 Units Subcutaneous QHS   insulin aspart  0-9 Units Subcutaneous TID WC   levothyroxine  88 mcg Oral Daily   Continuous Infusions:  cefTRIAXone (ROCEPHIN)  IV Stopped (08/16/22 2300)     LOS: 0 days     Sidney Ace, MD [facility]   If 7PM-7AM, please contact night-coverage  08/17/2022, 11:40 AM

## 2022-08-17 NOTE — Care Management Obs Status (Signed)
[place] NOTIFICATION   Patient Details  Name: [name] MRN: [id number] Date of Birth: June 16, 1941   Medicare Observation Status Notification Given:       Conception Oms, RN 08/17/2022, 8:52 AM

## 2022-08-18 DIAGNOSIS — L03116 Cellulitis of left lower limb: Secondary | ICD-10-CM | POA: Diagnosis not present

## 2022-08-18 DIAGNOSIS — E440 Moderate protein-calorie malnutrition: Secondary | ICD-10-CM | POA: Insufficient documentation

## 2022-08-18 DIAGNOSIS — L03115 Cellulitis of right lower limb: Secondary | ICD-10-CM | POA: Diagnosis not present

## 2022-08-18 DIAGNOSIS — E43 Unspecified severe protein-calorie malnutrition: Secondary | ICD-10-CM | POA: Insufficient documentation

## 2022-08-18 LAB — GLUCOSE, CAPILLARY
Glucose-Capillary: 219 mg/dL — ABNORMAL HIGH (ref 70–99)
Glucose-Capillary: 119 mg/dL — ABNORMAL HIGH (ref 70–99)

## 2022-08-18 MED ORDER — LINEZOLID 600 MG PO TABS: 600.0000 mg | ORAL_TABLET | Freq: Two times a day (BID) | ORAL | 0 refills | Status: DC

## 2022-08-18 MED ORDER — LINEZOLID 600 MG PO TABS
600.0000 mg | ORAL_TABLET | Freq: Two times a day (BID) | ORAL | Status: DC
  Administered 2022-08-18: 600 mg via ORAL
  Filled 2022-08-18: qty 1

## 2022-08-18 NOTE — Evaluation (Signed)
Physical Therapy Evaluation Patient Details Name: [name] MRN: [id number] DOB: [birth year] Today's Date: 08/18/2022  History of Present Illness  81 y.o. female with medical history significant of hypertension, hyperlipidemia, diabetes mellitus, hypothyroidism, gout, breast cancer (s/p radiation and mastectomy), PVD, iron deficiency anemia, dCHF, who presents with bilateral lower leg pain. Both legs are erythematous, left leg is worse than the right.  Clinical Impression  Pt appears to be near baseline today, able to demonstrate her techniques for bed mobility, stand pivot transfers from bed to transport chair left, and self propulsion in transport chair. Pt reports confidence in ability to return to home safely today. She denies any acute DME needs. Pt not agreeable to HHPT services but willing to give it some serious consideration. Pt now waiting on a ride for DC.      Recommendations for follow up therapy are one component of a multi-disciplinary discharge planning process, led by the attending physician.  Recommendations may be updated based on patient status, additional functional criteria and insurance authorization.  Follow Up Recommendations Home health PT (Pt note sure, wants to check with PCP first)      Assistance Recommended at Discharge    Patient can return home with the following  Assist for transportation;Assistance with cooking/housework    Equipment Recommendations None recommended by PT  Recommendations for Other Services       Functional Status Assessment Patient has not had a recent decline in their functional status     Precautions / Restrictions Precautions Precautions: Fall Restrictions Weight Bearing Restrictions: No      Mobility  Bed Mobility Overal bed mobility: Modified Independent             General bed mobility comments: increased effort/time to complete    Transfers Overall transfer level: Needs assistance Equipment used:  None Transfers: Bed to chair/wheelchair/BSC Sit to Stand: Min guard Stand pivot transfers: Min guard              Ambulation/Gait Ambulation/Gait assistance:  (doesn't really AMB)                Hotel manager mobility: Yes Wheelchair propulsion: Both lower extermities Wheelchair parts: Independent Distance: 83 Wheelchair Assistance Details (indicate cue type and reason): using her transport chair from home, uses a hamstrings-based propulsion technique, as well as for turning  Modified Rankin (Stroke Patients Only)       Balance                                             Pertinent Vitals/Pain      Home Living Family/patient expects to be discharged to:: Private residence Living Arrangements: Spouse/significant other Available Help at Discharge: Family;Available 24 hours/day Type of Home: House Home Access: Level entry       Home Layout: One level Home Equipment: Transport chair;Shower seat - built in      Prior Function Prior Level of Function : Needs assist             Mobility Comments: sits in transport chair for mobility, PRN assist from spouse for transfers in/out of her chair, L knee arthritis/pain limiting mobility ADLs Comments: Takes seated sponge bath, generally able to get dressed and toilet herself. Spouse assists with IADL, Pt denies falls in past 80mo  Hand Dominance        Extremity/Trunk Assessment   Upper Extremity Assessment Upper Extremity Assessment: Generalized weakness    Lower Extremity Assessment Lower Extremity Assessment: Generalized weakness    Cervical / Trunk Assessment Cervical / Trunk Assessment: Kyphotic  Communication   Communication: HOH  Cognition                                                General Comments      Exercises     Assessment/Plan    PT Assessment All further PT needs can be  met in the next venue of care  PT Problem List Decreased mobility       PT Treatment Interventions      PT Goals (Current goals can be found in the Care Plan section)  Acute Rehab PT Goals PT Goal Formulation: All assessment and education complete, DC therapy    Frequency       Co-evaluation               AM-PAC PT "6 Clicks" Mobility  Outcome Measure Help needed turning from your back to your side while in a flat bed without using bedrails?: A Little Help needed moving from lying on your back to sitting on the side of a flat bed without using bedrails?: A Little Help needed moving to and from a bed to a chair (including a wheelchair)?: A Little Help needed standing up from a chair using your arms (e.g., wheelchair or bedside chair)?: A Little Help needed to walk in hospital room?: Total Help needed climbing 3-5 steps with a railing? : Total 6 Click Score: 14    End of Session   Activity Tolerance: Patient tolerated treatment well;No increased pain Patient left: in chair   PT Visit Diagnosis: Other abnormalities of gait and mobility (R26.89)    Time: 3729-0211 PT Time Calculation (min) (ACUTE ONLY): 10 min   Charges:   PT Evaluation $PT Eval Low Complexity: 1 Low        2:45 PM, 08/18/22 Etta Grandchild, PT, DPT Physical Therapist - [facility]  [phone] ([place])    [place] 08/18/2022, 2:42 PM

## 2022-08-18 NOTE — Evaluation (Signed)
Occupational Therapy Evaluation Patient Details Name: [name] MRN: [id number] DOB: [birth year] Today's Date: 08/18/2022   History of Present Illness 81 y.o. female with medical history significant of hypertension, hyperlipidemia, diabetes mellitus, hypothyroidism, gout, breast cancer (s/p radiation and mastectomy), PVD, iron deficiency anemia, dCHF, who presents with bilateral lower leg pain. Both legs are erythematous, left leg is worse than the right.   Clinical Impression   Pt was seen for OT evaluation this date. Prior to hospital admission, pt was primarily using transport chair for mobility, requiring PRN assist from spouse for transfers in/out of it (for ~79mo, completing dressing, sponge bath, and toileting herself. Sp[facility] manages IADL. [facility]. Pt presents to acute OT demonstrating impaired ADL performance and functional mobility 2/2 decreased strength, balance, and activity tolerance (See OT problem list). Pt currently requires PRN MIN A for LB ADL, set up and supv for seated UB ADL, and SBA-CGA for ADL transfers (STS, SPT). Pt educated in role of OT, recommendations. Pt politely declines HHOT services at this time. Reports she will speak with her PCP about it. Pt would benefit from skilled OT services at discharge to address noted impairments and functional limitations (see below for any additional details) in order to maximize safety and independence while minimizing falls risk and caregiver burden. Upon hospital discharge, recommend HHOT to maximize pt safety and return to functional independence during meaningful occupations of daily life.     Recommendations for follow up therapy are one component of a multi-disciplinary discharge planning process, led by the attending physician.  Recommendations may be updated based on patient status, additional functional criteria and insurance authorization.   Follow Up Recommendations  Home health OT (pt declining)     Assistance Recommended at Discharge PRN  Patient can return home with the following A little help with walking and/or transfers;A little help with bathing/dressing/bathroom;Assistance with cooking/housework;Assist for transportation    Functional Status Assessment  Patient has had a recent decline in their functional status and demonstrates the ability to make significant improvements in function in a reasonable and predictable amount of time.  Equipment Recommendations  None recommended by OT    Recommendations for Other Services       Precautions / Restrictions Precautions Precautions: Fall Restrictions Weight Bearing Restrictions: No      Mobility Bed Mobility Overal bed mobility: Modified Independent             General bed mobility comments: increased effort/time to complete    Transfers Overall transfer level: Needs assistance   Transfers: Sit to/from Stand, Bed to chair/wheelchair/BSC Sit to Stand: Min guard Stand pivot transfers: Min guard         General transfer comment: increased time/effort to perform      Balance Overall balance assessment: Needs assistance Sitting-balance support: Feet supported, No upper extremity supported Sitting balance-Leahy Scale: Fair     Standing balance support: Single extremity supported Standing balance-Leahy Scale: Fair                             ADL either performed or assessed with clinical judgement   ADL                                         General ADL Comments: Pt completed seated UB ADL with setup and supv, PRN  MIN A for LB ADL tasks, assist for readjusting velcro on sandals 2/2 bandaging on feet making sandals too tight. Set up and supv for seated pericare after toileting and SBA-CGA for SPT to/from [facility].     Vision         Perception     Praxis      Pertinent Vitals/Pain Pain Assessment Pain Assessment: No/denies pain     Hand Dominance      Extremity/Trunk Assessment Upper Extremity Assessment Upper Extremity Assessment: Generalized weakness   Lower Extremity Assessment Lower Extremity Assessment: Generalized weakness   Cervical / Trunk Assessment Cervical / Trunk Assessment: Kyphotic   Communication Communication Communication: HOH   Cognition Arousal/Alertness: Awake/alert Behavior During Therapy: WFL for tasks assessed/performed Overall Cognitive Status: Within Functional Limits for tasks assessed                                       General Comments       Exercises Other Exercises Other Exercises: Pt educated in role of acute OT and recommendations. Pt declining HHOT at this time. MD aware.   Shoulder Instructions      Home Living Family/patient expects to be discharged to:: Private residence Living Arrangements: Spouse/significant other Available Help at Discharge: Family;Available 24 hours/day Type of Home: House Home Access: Level entry     Home Layout: One level     Bathroom Shower/Tub: Chief Strategy Officer: Transport chair;Shower seat - built in          Prior Functioning/Environment Prior Level of Function : Needs assist             Mobility Comments: sits in transport chair for mobility, PRN assist from spouse for transfers in/out of her chair, L knee arthritis/pain limiting mobility ADLs Comments: Takes seated sponge bath, generally able to get dressed and toilet herself. Spouse assists with IADL, Pt denies falls in past 30mo       OT Problem List: Decreased strength;Decreased range of motion;Decreased activity tolerance;Impaired balance (sitting and/or standing);Decreased knowledge of use of DME or AE      OT Treatment/Interventions:      OT Goals(Current goals can be found in the care plan section) Acute Rehab OT Goals Patient Stated Goal: go home OT Goal Formulation: All assessment and education complete, DC therapy  OT Frequency:       Co-evaluation              AM-PAC OT "6 Clicks" Daily Activity     Outcome Measure Help from another person eating meals?: None Help from another person taking care of personal grooming?: None Help from another person toileting, which includes using toliet, bedpan, or urinal?: A Little Help from another person bathing (including washing, rinsing, drying)?: A Little Help from another person to put on and taking off regular upper body clothing?: None Help from another person to put on and taking off regular lower body clothing?: A Little 6 Click Score: 21   End of Session    Activity Tolerance: Patient tolerated treatment well Patient left: in bed;with call bell/phone within reach;Other (comment) (with PT)  OT Visit Diagnosis: Other abnormalities of gait and mobility (R26.89);Muscle weakness (generalized) (M62.81)                Time: 11610-9604OT Time Calculation (min): 19 min Charges:  OT General Charges $  OT Visit: 1 Visit OT Evaluation $OT Eval Low Complexity: 1 Low OT Treatments $Self Care/Home Management : 8-22 mins  Ardeth Perfect., MPH, MS, OTR/L ascom [phone] 08/18/22, 2:39 PM

## 2022-08-18 NOTE — Discharge Summary (Signed)
Physician Discharge Summary  SAPNA PADRON XNA:[id number] DOB: [birth year] DOA: 08/16/2022  PCP: Tracie Harrier, MD  Admit date: 08/16/2022 Discharge date: 08/18/2022  Admitted From: Home Disposition:  Home  Recommendations for Outpatient Follow-up:  Follow up with PCP in 1-2 weeks Follow-up with wound care center on Tuesday 10/10  Home Health: No Equipment/Devices: None  Discharge Condition: Stable CODE STATUS: Full Diet recommendation: Regular  Brief/Interim Summary: 81 y.o. female with medical history significant of hypertension, hyperlipidemia, diabetes mellitus, hypothyroidism, gout, breast cancer (s/p radiation and mastectomy), PVD, iron deficiency anemia, dCHF, who presents with bilateral lower leg pain.   Patient states that she has a bilateral lower leg pain due to cellulitis for more than 2 weeks.  Patient was initially started on doxycycline. She developed diarrhea, then was switched to Levaquin, but she continues to have diarrhea after started Levaquin.  She states that she has diarrhea for more than 7 days.  She has had more than 5 times of watery diarrhea since yesterday.  No nausea, vomiting, abdominal pain.  No fever or chills.  Patient does not have chest pain, cough, shortness of breath.  She has some mild tenderness in both legs.  Both legs are erythematous, left leg is worse than the right.   Patient started on IV Rocephin.  C. difficile was initially ordered however diarrhea stopped and patient had formed stools.  Low suspicion for infectious diarrhea.  MRSA screen positive.  Will need antibiotic with MRSA coverage.  Patient has been previously intolerant of doxycycline.  She has a sulfa allergy making Bactrim a poor choice.  Will recommend p.o. Zyvox 600 mg twice daily.  With pharmacy assistance determine that out-of-pocket cost to be $14 for a 7-day course.  Offered home health and inpatient physical therapy and Occupational Therapy evaluations.  Patient is  declining at this time.  Okay for discharge home.  We will follow-up in wound center as previously scheduled 10/10.  We will follow-up with primary care physician.    Discharge Diagnoses:  Principal Problem:   Cellulitis of both lower extremities Active Problems:   Diarrhea   Acquired hypothyroidism   Benign essential hypertension   Chronic diastolic CHF (congestive heart failure) (HCC)   Diabetes mellitus without complication (HCC)   Gout   HLD (hyperlipidemia)   Iron deficiency anemia   Cellulitis of lower extremity   Malnutrition of moderate degree  * Cellulitis of both lower extremities Patient could not tolerate doxycycline and Levaquin due to diarrhea at home.  Patient does not have fever or leukocytosis.  Not septic. DVT ruled out Plan: Okay for discharge home.  Discontinue Rocephin.  MRSA screen positive.  Patient will need antibiotic with MRSA coverage.  Did not tolerate doxycycline.  Sulfa allergy, Bactrim and poor choice.  Will prescribe Zyvox 600 mg p.o. twice daily x7 days.  Follow-up outpatient PCP.  Patient has a scheduled follow-up in the wound clinic on Tuesday 10/10.   Diarrhea Resolved.  Low suspicion for infectious diarrhea.  Discharge Instructions  Discharge Instructions     Diet - low sodium heart healthy   Complete by: As directed    Discharge wound care:   Complete by: As directed    Follow up as previously scheduled with wound care clinic on 10/10   Increase activity slowly   Complete by: As directed       Allergies as of 08/18/2022       Reactions   Other Anaphylaxis   Anesthisia has been a health issue  for her in the past.    Sulfa Antibiotics Rash        Medication List     TAKE these medications    acetaminophen 500 MG tablet Commonly known as: TYLENOL Take 500-1,000 mg by mouth 4 (four) times daily as needed for mild pain or moderate pain.   allopurinol 300 MG tablet Commonly known as: ZYLOPRIM Take 300 mg by mouth daily.    atenolol 50 MG tablet Commonly known as: TENORMIN Take 50 mg by mouth daily.   atorvastatin 40 MG tablet Commonly known as: LIPITOR Take 40 mg by mouth daily.   cyanocobalamin 1000 MCG tablet Commonly known as: VITAMIN B12 Take 1,000 mcg by mouth daily.   furosemide 40 MG tablet Commonly known as: LASIX Take 40 mg by mouth daily.   gentamicin cream 0.1 % Commonly known as: GARAMYCIN Apply 1 Application topically in the morning and at bedtime.   HYDROcodone-acetaminophen 7.5-325 MG tablet Commonly known as: NORCO Take 1 tablet by mouth 2 (two) times daily as needed for moderate pain or severe pain.   levothyroxine 88 MCG tablet Commonly known as: SYNTHROID Take 88 mcg by mouth daily.   linezolid 600 MG tablet Commonly known as: ZYVOX Take 1 tablet (600 mg total) by mouth every 12 (twelve) hours for 7 days.   metFORMIN 1000 MG tablet Commonly known as: GLUCOPHAGE Take 1,000 mg by mouth in the morning and at bedtime.   sitaGLIPtin 50 MG tablet Commonly known as: JANUVIA Take 50 mg by mouth daily.               Discharge Care Instructions  (From admission, onward)           Start     Ordered   08/18/22 0000  Discharge wound care:       Comments: Follow up as previously scheduled with wound care clinic on 10/10   08/18/22 1401            Allergies  Allergen Reactions   Other Anaphylaxis    Anesthisia has been a health issue for her in the past.    Sulfa Antibiotics Rash    Consultations: None   Procedures/Studies: US Venous Img Lower Bilateral (DVT)  Result Date: 08/17/2022 CLINICAL DATA:  Bilateral lower extremity cellulitis. History of malignancy. Evaluate for DVT. EXAM: BILATERAL LOWER EXTREMITY VENOUS DOPPLER ULTRASOUND TECHNIQUE: Gray-scale sonography with graded compression, as well as color Doppler and duplex ultrasound were performed to evaluate the lower extremity deep venous systems from the level of the common femoral vein and  including the common femoral, femoral, profunda femoral, popliteal and calf veins including the posterior tibial, peroneal and gastrocnemius veins when visible. The superficial great saphenous vein was also interrogated. Spectral Doppler was utilized to evaluate flow at rest and with distal augmentation maneuvers in the common femoral, femoral and popliteal veins. COMPARISON:  Bilateral lower extremity venous Doppler ultrasound-06/02/2018 (negative). FINDINGS: RIGHT LOWER EXTREMITY Common Femoral Vein: No evidence of thrombus. Normal compressibility, respiratory phasicity and response to augmentation. Saphenofemoral Junction: No evidence of thrombus. Normal compressibility and flow on color Doppler imaging. Profunda Femoral Vein: No evidence of thrombus. Normal compressibility and flow on color Doppler imaging. Femoral Vein: No evidence of thrombus. Normal compressibility, respiratory phasicity and response to augmentation. Popliteal Vein: No evidence of thrombus. Normal compressibility, respiratory phasicity and response to augmentation. Calf Veins: Appear patent where visualized. Superficial Great Saphenous Vein: No evidence of thrombus. Normal compressibility. Other Findings: Note is made of an approximately  2.5 x 1.5 x 0.5 cm anechoic fluid collection with the right popliteal fossa compatible with a Baker's cyst. LEFT LOWER EXTREMITY Common Femoral Vein: No evidence of thrombus. Normal compressibility, respiratory phasicity and response to augmentation. Saphenofemoral Junction: No evidence of thrombus. Normal compressibility and flow on color Doppler imaging. Profunda Femoral Vein: No evidence of thrombus. Normal compressibility and flow on color Doppler imaging. Femoral Vein: No evidence of thrombus. Normal compressibility, respiratory phasicity and response to augmentation. Popliteal Vein: No evidence of thrombus. Normal compressibility, respiratory phasicity and response to augmentation. Calf Veins: No  evidence of thrombus. Normal compressibility and flow on color Doppler imaging. Superficial Great Saphenous Vein: No evidence of thrombus. Normal compressibility. Venous Reflux:  Appear patent where visualized. Other Findings:  None IMPRESSION: 1. No evidence of DVT within either lower extremity. 2. Incidental note made of an approximately 2.5 cm right-sided Baker's cyst. Electronically Signed   By: Sandi Mariscal M.D.   On: 08/17/2022 14:50   VAS [place] ABI WITH/WO TBI  Result Date: 07/31/2022  LOWER EXTREMITY DOPPLER STUDY Patient Name:  NEILA TEEM  Date of Exam:   07/27/2022 Medical Rec #: [id number]           Accession #:    [id number] Date of Birth: 1941/01/05           Patient Gender: F Patient Age:   89 years Exam Location:  [facility] Procedure:      VAS [place] ABI WITH/WO TBI Referring Phys: [name] --------------------------------------------------------------------------------  Indications: Rest pain. Wounds  Performing Technologist: Almira Coaster RVS  Examination Guidelines: A complete evaluation includes at minimum, Doppler waveform signals and systolic blood pressure reading at the level of bilateral brachial, anterior tibial, and posterior tibial arteries, when vessel segments are accessible. Bilateral testing is considered an integral part of a complete examination. Photoelectric Plethysmograph (PPG) waveforms and toe systolic pressure readings are included as required and additional duplex testing as needed. Limited examinations for reoccurring indications may be performed as noted.  ABI Findings: +---------+------------------+-----+---------+--------+ Right    Rt Pressure (mmHg)IndexWaveform Comment  +---------+------------------+-----+---------+--------+ ATA      156               1.30 triphasic         +---------+------------------+-----+---------+--------+ PTA      141               1.18 triphasic         +---------+------------------+-----+---------+--------+  Great Toe135               1.12 Normal            +---------+------------------+-----+---------+--------+ +---------+------------------+-----+---------+-------+ Left     Lt Pressure (mmHg)IndexWaveform Comment +---------+------------------+-----+---------+-------+ Brachial 120                                     +---------+------------------+-----+---------+-------+ ATA      140               1.17 triphasic        +---------+------------------+-----+---------+-------+ PTA      152               1.27 triphasic        +---------+------------------+-----+---------+-------+ Great Toe145               1.21 Normal           +---------+------------------+-----+---------+-------+ +-------+-----------+-----------+------------+------------+ ABI/TBIToday's ABIToday's TBIPrevious  ABIPrevious TBI +-------+-----------+-----------+------------+------------+ Right  1.30       1.12                                +-------+-----------+-----------+------------+------------+ Left   1.27       1.21                                +-------+-----------+-----------+------------+------------+  Summary: Right: Resting right ankle-brachial index is within normal range. The right toe-brachial index is normal. Left: Resting left ankle-brachial index is within normal range. The left toe-brachial index is normal. *See table(s) above for measurements and observations.  Electronically signed by [name] MD on 07/31/2022 at 1:19:59 PM.    Final       Subjective: Seen and examined on day of discharge.  Stable no distress.  Pain well controlled.  No fevers over interval.  Stable for discharge home. Discharge Exam: Vitals:   08/18/22 0104 08/18/22 0840  BP: (!) 101/58 132/73  Pulse: 86 84  Resp: 16 17  Temp: 98.2 F (36.8 C) 98 F (36.7 C)  SpO2: 90% 100%   Vitals:   08/17/22 1504 08/17/22 1521 08/18/22 0104 08/18/22 0840  BP: 103/66 (!) 116/58 (!) 101/58 132/73  Pulse: 89 88 86 84   Resp: '16 16 16 17  '$ Temp: 99.4 F (37.4 C) 99.4 F (37.4 C) 98.2 F (36.8 C) 98 F (36.7 C)  TempSrc:      SpO2:  94% 90% 100%  Weight:      Height:        General: Pt is alert, awake, not in acute distress Cardiovascular: RRR, S1/S2 +, no rubs, no gallops Respiratory: CTA bilaterally, no wheezing, no rhonchi Abdominal: Soft, NT, ND, bowel sounds + Extremities: no edema, no cyanosis    The results of significant diagnostics from this hospitalization (including imaging, microbiology, ancillary and laboratory) are listed below for reference.     Microbiology: Recent Results (from the past 240 hour(s))  Culture, blood (Routine X 2) w Reflex to ID Panel     Status: None (Preliminary result)   Collection Time: 08/16/22  1:47 PM   Specimen: BLOOD  Result Value Ref Range Status   Specimen Description BLOOD LEFT ASSIST CONTROL  Final   Special Requests   Final    BOTTLES DRAWN AEROBIC AND ANAEROBIC Blood Culture results may not be optimal due to an excessive volume of blood received in culture bottles   Culture   Final    NO GROWTH 2 DAYS Performed at [facility], [address], [place], [place] [address]    Report Status PENDING  Incomplete  Culture, blood (Routine X 2) w Reflex to ID Panel     Status: None (Preliminary result)   Collection Time: 08/16/22  7:32 PM   Specimen: BLOOD  Result Value Ref Range Status   Specimen Description BLOOD RIGHT ANTECUBITAL  Final   Special Requests   Final    BOTTLES DRAWN AEROBIC AND ANAEROBIC Blood Culture adequate volume   Culture   Final    NO GROWTH 2 DAYS Performed at [facility], [address] [place], [place] [address]    Report Status PENDING  Incomplete  MRSA Next Gen by PCR, Nasal     Status: Abnormal   Collection Time: 08/17/22  2:16 PM   Specimen: Nasal Mucosa; Nasal Swab  Result Value Ref Range Status   MRSA by PCR Next Gen DETECTED (A) NOT DETECTED Final    Comment: RESULT CALLED TO, READ BACK  BY AND VERIFIED WITH: LACLEFHA MEADWF AT 1545 ON 08/17/2022 BY SS (NOTE) The GeneXpert MRSA Assay (FDA approved for NASAL specimens only), is one component of a comprehensive MRSA colonization surveillance program. It is not intended to diagnose MRSA infection nor to guide or monitor treatment for MRSA infections. Test performance is not FDA approved in patients less than 28 years old. Performed at [facility], [address] [place], [place] [address]      Labs: BNP (last 3 results) Recent Labs    08/16/22 1347  BNP 18.8   Basic Metabolic Panel: Recent Labs  Lab 08/16/22 1347  NA 139  K 4.3  CL 107  CO2 23  GLUCOSE 95  BUN 14  CREATININE 0.87  CALCIUM 9.2   Liver Function Tests: Recent Labs  Lab 08/16/22 1347  AST 18  ALT 9  ALKPHOS 54  BILITOT 0.4  PROT 7.3  ALBUMIN 3.6   No results for input(s): "LIPASE", "AMYLASE" in the last 168 hours. No results for input(s): "AMMONIA" in the last 168 hours. CBC: Recent Labs  Lab 08/16/22 1347 08/17/22 0433  WBC 9.6 7.9  NEUTROABS 6.4  --   HGB 9.7* 8.6*  HCT 32.7* 28.8*  MCV 87.2 86.0  PLT 367 302   Cardiac Enzymes: No results for input(s): "CKTOTAL", "CKMB", "CKMBINDEX", "TROPONINI" in the last 168 hours. BNP: Invalid input(s): "POCBNP" CBG: Recent Labs  Lab 08/17/22 1219 08/17/22 1729 08/17/22 2254 08/18/22 0929 08/18/22 1254  GLUCAP 122* 149* 110* 219* 119*   D-Dimer No results for input(s): "DDIMER" in the last 72 hours. Hgb A1c Recent Labs    08/16/22 1932  HGBA1C 5.7*   Lipid Profile No results for input(s): "CHOL", "HDL", "LDLCALC", "TRIG", "CHOLHDL", "LDLDIRECT" in the last 72 hours. Thyroid function studies No results for input(s): "TSH", "T4TOTAL", "T3FREE", "THYROIDAB" in the last 72 hours.  Invalid input(s): "FREET3" Anemia work up No results for input(s): "VITAMINB12", "FOLATE", "FERRITIN", "TIBC", "IRON", "RETICCTPCT" in the last 72 hours. Urinalysis    Component  Value Date/Time   COLORURINE STRAW (A) 08/16/2022 2130   APPEARANCEUR CLEAR (A) 08/16/2022 2130   APPEARANCEUR Cloudy (A) 09/08/2018 1519   LABSPEC 1.004 (L) 08/16/2022 2130   LABSPEC 1.016 05/20/2012 1345   PHURINE 5.0 08/16/2022 2130   GLUCOSEU NEGATIVE 08/16/2022 2130   GLUCOSEU Negative 05/20/2012 1345   HGBUR NEGATIVE 08/16/2022 2130   BILIRUBINUR NEGATIVE 08/16/2022 2130   BILIRUBINUR Negative 09/08/2018 1519   BILIRUBINUR Negative 05/20/2012 1345   [place] 08/16/2022 2130   PROTEINUR NEGATIVE 08/16/2022 2130   NITRITE NEGATIVE 08/16/2022 2130   LEUKOCYTESUR NEGATIVE 08/16/2022 2130   LEUKOCYTESUR 3+ 05/20/2012 1345   Sepsis Labs Recent Labs  Lab 08/16/22 1347 08/17/22 0433  WBC 9.6 7.9   Microbiology Recent Results (from the past 240 hour(s))  Culture, blood (Routine X 2) w Reflex to ID Panel     Status: None (Preliminary result)   Collection Time: 08/16/22  1:47 PM   Specimen: BLOOD  Result Value Ref Range Status   Specimen Description BLOOD LEFT ASSIST CONTROL  Final   Special Requests   Final    BOTTLES DRAWN AEROBIC AND ANAEROBIC Blood Culture results may not be optimal due to an excessive volume of blood received in culture bottles   Culture   Final    NO GROWTH  2 DAYS Performed at [facility], [address] [place], [place] [address]    Report Status PENDING  Incomplete  Culture, blood (Routine X 2) w Reflex to ID Panel     Status: None (Preliminary result)   Collection Time: 08/16/22  7:32 PM   Specimen: BLOOD  Result Value Ref Range Status   Specimen Description BLOOD RIGHT ANTECUBITAL  Final   Special Requests   Final    BOTTLES DRAWN AEROBIC AND ANAEROBIC Blood Culture adequate volume   Culture   Final    NO GROWTH 2 DAYS Performed at [facility], [address], [place], [place] [address]    Report Status PENDING  Incomplete  MRSA Next Gen by PCR, Nasal     Status: Abnormal   Collection Time: 08/17/22  2:16 PM    Specimen: Nasal Mucosa; Nasal Swab  Result Value Ref Range Status   MRSA by PCR Next Gen DETECTED (A) NOT DETECTED Final    Comment: RESULT CALLED TO, READ BACK BY AND VERIFIED WITH: LACLEFHA MEADWF AT 1545 ON 08/17/2022 BY SS (NOTE) The GeneXpert MRSA Assay (FDA approved for NASAL specimens only), is one component of a comprehensive MRSA colonization surveillance program. It is not intended to diagnose MRSA infection nor to guide or monitor treatment for MRSA infections. Test performance is not FDA approved in patients less than 22 years old. Performed at [facility], [address] [place],  [address]      Time coordinating discharge: Over 30 minutes  SIGNED:   Sidney Ace, MD  [facility] 08/18/2022, 2:03 PM Pager   If 7PM-7AM, please contact night-coverage

## 2022-08-18 NOTE — Plan of Care (Signed)

## 2022-08-21 ENCOUNTER — Other Ambulatory Visit: Payer: Self-pay

## 2022-08-21 ENCOUNTER — Emergency Department
Admission: EM | Admit: 2022-08-21 | Discharge: 2022-08-21 | Disposition: A | Discharge status: Home / Self Care | Payer: PPO | Source: Home / Self Care | Attending: Emergency Medicine | Admitting: Emergency Medicine

## 2022-08-21 ENCOUNTER — Encounter: Payer: Self-pay | Admitting: Emergency Medicine

## 2022-08-21 ENCOUNTER — Ambulatory Visit: Payer: PPO | Admitting: Physician Assistant

## 2022-08-21 ENCOUNTER — Emergency Department: Payer: PPO

## 2022-08-21 DIAGNOSIS — R4182 Altered mental status, unspecified: Secondary | ICD-10-CM | POA: Diagnosis not present

## 2022-08-21 DIAGNOSIS — M545 Low back pain, unspecified: Secondary | ICD-10-CM | POA: Insufficient documentation

## 2022-08-21 DIAGNOSIS — G8929 Other chronic pain: Secondary | ICD-10-CM | POA: Insufficient documentation

## 2022-08-21 DIAGNOSIS — E039 Hypothyroidism, unspecified: Secondary | ICD-10-CM | POA: Insufficient documentation

## 2022-08-21 DIAGNOSIS — A419 Sepsis, unspecified organism: Secondary | ICD-10-CM | POA: Diagnosis not present

## 2022-08-21 DIAGNOSIS — Z9011 Acquired absence of right breast and nipple: Secondary | ICD-10-CM | POA: Diagnosis not present

## 2022-08-21 DIAGNOSIS — Z853 Personal history of malignant neoplasm of breast: Secondary | ICD-10-CM | POA: Insufficient documentation

## 2022-08-21 DIAGNOSIS — R0902 Hypoxemia: Secondary | ICD-10-CM | POA: Diagnosis not present

## 2022-08-21 DIAGNOSIS — K5732 Diverticulitis of large intestine without perforation or abscess without bleeding: Secondary | ICD-10-CM | POA: Diagnosis not present

## 2022-08-21 DIAGNOSIS — R6520 Severe sepsis without septic shock: Secondary | ICD-10-CM | POA: Diagnosis not present

## 2022-08-21 DIAGNOSIS — R109 Unspecified abdominal pain: Secondary | ICD-10-CM | POA: Diagnosis not present

## 2022-08-21 DIAGNOSIS — R000 Tachycardia, unspecified: Secondary | ICD-10-CM | POA: Diagnosis not present

## 2022-08-21 DIAGNOSIS — L03116 Cellulitis of left lower limb: Secondary | ICD-10-CM | POA: Insufficient documentation

## 2022-08-21 DIAGNOSIS — I700 Atherosclerosis of aorta: Secondary | ICD-10-CM | POA: Diagnosis not present

## 2022-08-21 DIAGNOSIS — I5032 Chronic diastolic (congestive) heart failure: Secondary | ICD-10-CM | POA: Diagnosis not present

## 2022-08-21 DIAGNOSIS — K5792 Diverticulitis of intestine, part unspecified, without perforation or abscess without bleeding: Secondary | ICD-10-CM | POA: Diagnosis not present

## 2022-08-21 DIAGNOSIS — I10 Essential (primary) hypertension: Secondary | ICD-10-CM | POA: Diagnosis not present

## 2022-08-21 DIAGNOSIS — I959 Hypotension, unspecified: Secondary | ICD-10-CM | POA: Diagnosis not present

## 2022-08-21 DIAGNOSIS — E876 Hypokalemia: Secondary | ICD-10-CM | POA: Diagnosis not present

## 2022-08-21 DIAGNOSIS — R609 Edema, unspecified: Secondary | ICD-10-CM | POA: Diagnosis not present

## 2022-08-21 DIAGNOSIS — G9341 Metabolic encephalopathy: Secondary | ICD-10-CM | POA: Diagnosis not present

## 2022-08-21 DIAGNOSIS — I110 Hypertensive heart disease with heart failure: Secondary | ICD-10-CM | POA: Insufficient documentation

## 2022-08-21 DIAGNOSIS — E7800 Pure hypercholesterolemia, unspecified: Secondary | ICD-10-CM | POA: Diagnosis not present

## 2022-08-21 DIAGNOSIS — M549 Dorsalgia, unspecified: Secondary | ICD-10-CM | POA: Diagnosis not present

## 2022-08-21 DIAGNOSIS — E1151 Type 2 diabetes mellitus with diabetic peripheral angiopathy without gangrene: Secondary | ICD-10-CM | POA: Diagnosis not present

## 2022-08-21 DIAGNOSIS — L03115 Cellulitis of right lower limb: Secondary | ICD-10-CM | POA: Insufficient documentation

## 2022-08-21 DIAGNOSIS — L89321 Pressure ulcer of left buttock, stage 1: Secondary | ICD-10-CM | POA: Diagnosis not present

## 2022-08-21 DIAGNOSIS — E119 Type 2 diabetes mellitus without complications: Secondary | ICD-10-CM | POA: Diagnosis not present

## 2022-08-21 DIAGNOSIS — R9431 Abnormal electrocardiogram [ECG] [EKG]: Secondary | ICD-10-CM | POA: Diagnosis not present

## 2022-08-21 DIAGNOSIS — D638 Anemia in other chronic diseases classified elsewhere: Secondary | ICD-10-CM | POA: Diagnosis not present

## 2022-08-21 DIAGNOSIS — R0689 Other abnormalities of breathing: Secondary | ICD-10-CM | POA: Diagnosis not present

## 2022-08-21 DIAGNOSIS — Z923 Personal history of irradiation: Secondary | ICD-10-CM | POA: Diagnosis not present

## 2022-08-21 DIAGNOSIS — R410 Disorientation, unspecified: Secondary | ICD-10-CM | POA: Diagnosis not present

## 2022-08-21 DIAGNOSIS — E1140 Type 2 diabetes mellitus with diabetic neuropathy, unspecified: Secondary | ICD-10-CM | POA: Insufficient documentation

## 2022-08-21 DIAGNOSIS — N134 Hydroureter: Secondary | ICD-10-CM | POA: Diagnosis not present

## 2022-08-21 DIAGNOSIS — M109 Gout, unspecified: Secondary | ICD-10-CM | POA: Diagnosis not present

## 2022-08-21 DIAGNOSIS — L03119 Cellulitis of unspecified part of limb: Secondary | ICD-10-CM

## 2022-08-21 DIAGNOSIS — Z882 Allergy status to sulfonamides status: Secondary | ICD-10-CM | POA: Diagnosis not present

## 2022-08-21 DIAGNOSIS — M48061 Spinal stenosis, lumbar region without neurogenic claudication: Secondary | ICD-10-CM | POA: Diagnosis not present

## 2022-08-21 DIAGNOSIS — G894 Chronic pain syndrome: Secondary | ICD-10-CM | POA: Diagnosis not present

## 2022-08-21 DIAGNOSIS — Z1152 Encounter for screening for COVID-19: Secondary | ICD-10-CM | POA: Diagnosis not present

## 2022-08-21 DIAGNOSIS — N1330 Unspecified hydronephrosis: Secondary | ICD-10-CM | POA: Diagnosis not present

## 2022-08-21 DIAGNOSIS — R197 Diarrhea, unspecified: Secondary | ICD-10-CM | POA: Diagnosis not present

## 2022-08-21 DIAGNOSIS — L89311 Pressure ulcer of right buttock, stage 1: Secondary | ICD-10-CM | POA: Diagnosis not present

## 2022-08-21 LAB — CBC WITH DIFFERENTIAL/PLATELET
Abs Immature Granulocytes: 0.06 10*3/uL (ref 0.00–0.07)
Basophils Absolute: 0 10*3/uL (ref 0.0–0.1)
Basophils Relative: 0 %
Eosinophils Absolute: 0.1 10*3/uL (ref 0.0–0.5)
Eosinophils Relative: 1 %
HCT: 32.3 % — ABNORMAL LOW (ref 36.0–46.0)
Hemoglobin: 9.6 g/dL — ABNORMAL LOW (ref 12.0–15.0)
Immature Granulocytes: 1 %
Lymphocytes Relative: 8 %
Lymphs Abs: 0.9 10*3/uL (ref 0.7–4.0)
MCH: 25.9 pg — ABNORMAL LOW (ref 26.0–34.0)
MCHC: 29.7 g/dL — ABNORMAL LOW (ref 30.0–36.0)
MCV: 87.3 fL (ref 80.0–100.0)
Monocytes Absolute: 0.5 10*3/uL (ref 0.1–1.0)
Monocytes Relative: 5 %
Neutro Abs: 9.4 10*3/uL — ABNORMAL HIGH (ref 1.7–7.7)
Neutrophils Relative %: 85 %
Platelets: 390 10*3/uL (ref 150–400)
RBC: 3.7 MIL/uL — ABNORMAL LOW (ref 3.87–5.11)
RDW: 19.7 % — ABNORMAL HIGH (ref 11.5–15.5)
WBC: 11 10*3/uL — ABNORMAL HIGH (ref 4.0–10.5)
nRBC: 0 % (ref 0.0–0.2)

## 2022-08-21 LAB — CULTURE, BLOOD (ROUTINE X 2)
Culture: NO GROWTH
Culture: NO GROWTH
Special Requests: ADEQUATE

## 2022-08-21 LAB — BASIC METABOLIC PANEL
Anion gap: 14 (ref 5–15)
BUN: 13 mg/dL (ref 8–23)
CO2: 19 mmol/L — ABNORMAL LOW (ref 22–32)
Calcium: 9.6 mg/dL (ref 8.9–10.3)
Chloride: 107 mmol/L (ref 98–111)
Creatinine, Ser: 0.95 mg/dL (ref 0.44–1.00)
GFR, Estimated: >60 mL/min (ref >60)
Glucose, Bld: 99 mg/dL (ref 70–99)
Potassium: 4 mmol/L (ref 3.5–5.1)
Sodium: 140 mmol/L (ref 135–145)

## 2022-08-21 MED ORDER — SODIUM CHLORIDE 0.9 % IV BOLUS
500.0000 mL | Freq: Once | INTRAVENOUS | Status: AC
  Administered 2022-08-21: 500 mL via INTRAVENOUS

## 2022-08-21 MED ORDER — LIDOCAINE 5 % EX PTCH
1.0000 | MEDICATED_PATCH | CUTANEOUS | Status: DC
  Administered 2022-08-21: 1 via TRANSDERMAL
  Filled 2022-08-21: qty 1

## 2022-08-21 MED ORDER — ACETAMINOPHEN 325 MG PO TABS
650.0000 mg | ORAL_TABLET | Freq: Once | ORAL | Status: AC
  Administered 2022-08-21: 650 mg via ORAL
  Filled 2022-08-21: qty 2

## 2022-08-21 MED ORDER — MORPHINE SULFATE (PF) 4 MG/ML IV SOLN
4.0000 mg | Freq: Once | INTRAVENOUS | Status: AC
  Administered 2022-08-21: 4 mg via INTRAVENOUS
  Filled 2022-08-21: qty 1

## 2022-08-21 NOTE — ED Notes (Signed)
Pt stripped down and placed in clean gown. No bedbugs seen on pt at all.

## 2022-08-21 NOTE — ED Triage Notes (Signed)
Presents via EMS from [place] she is having increased mid back pain   States she has chronic back pain

## 2022-08-21 NOTE — Discharge Instructions (Signed)
Your CT scan does not reveal any broken bones.  Please follow-up with the wound care center as you have scheduled for your bilateral lower legs.  Please return for any new, worsening, or change in symptoms or other concerns.  It was a pleasure caring for you today.

## 2022-08-21 NOTE — ED Provider Notes (Signed)
[facility] Provider Note    Event Date/Time   First MD Initiated Contact with Patient 08/21/22 1213     (approximate)   History   Back Pain   HPI  [name] is a 81 y.o. female with a past medical history of malnutrition, diabetes, CHF, peripheral artery disease, hypertension, breast cancer, chronic pain syndrome, type 2 diabetes who presents today for evaluation of back pain.  She presents today via EMS low back pain.  She reports that her pain is across her low back.  She reports that this is the same location that she usually has back pain.  She reports that she gets injections for these and has not had any injections in the past couple of months.  She denies any radiation of pain into her abdomen or into her legs.  She reports that her pain feels exactly the same as it always does.  She reports that she has not had any injuries or falls.  She reports that she has not had any fevers or chills.  She has not had any urinary symptoms or changes in her bowel movements.  She is currently being treated for cellulitis to her lower extremities.  Patient reports that she usually takes hydrocodone for pain.  Patient Active Problem List   Diagnosis Date Noted   Malnutrition of moderate degree 08/18/2022   Cellulitis of lower extremity 08/17/2022   Cellulitis of both lower extremities 08/16/2022   Diabetes mellitus without complication (HCC)    Gout    Chronic diastolic CHF (congestive heart failure) (HCC)    Iron deficiency anemia    Diarrhea    Cervical spondylosis 03/09/2020   Lower limb ulcer, ankle, right, limited to breakdown of skin ([place]) 09/09/2018   Chronic gouty arthritis 04/29/2018   Generalized osteoarthritis of hand 02/12/2018   Hyperuricemia 02/12/2018   Swelling of finger 02/12/2018   PAD (peripheral artery disease) ([place]) 04/30/2017   Lymphedema 04/30/2017   Bilateral edema of lower extremity 04/03/2017   Ecchymosis 04/03/2017   Leg pain,  bilateral 04/03/2017   Acquired hypothyroidism 01/24/2017   Benign essential hypertension 01/24/2017   History of mastectomy, right 01/24/2017   Primary osteoarthritis of left knee 02/29/2016   Cancer of right breast ([place]) 04/17/2015   Chronic pain 03/03/2015   Chronic pain of both knees 03/03/2015   Arthritis 01/11/2014   Soft tissue lesion of shoulder region 01/11/2014   Diabetes mellitus, type 2 ([place]) 01/11/2014   H/O renal calculi 01/11/2014   Hypertension 01/11/2014   HLD (hyperlipidemia) 01/11/2014   Neuropathy 01/11/2014   OP (osteoporosis) 01/11/2014   H/O malignant neoplasm of breast 01/11/2014          Physical Exam   Triage Vital Signs: ED Triage Vitals [08/21/22 1210]  Enc Vitals Group     BP      Pulse      Resp      Temp      Temp src      SpO2      Weight 121 lb 15.7 oz (55.3 kg)     Height '4\' 11"'$  (1.499 m)     Head Circumference      Peak Flow      Pain Score 8     Pain Loc      Pain Edu?      Excl. in [place]?     Most recent vital signs: Vitals:   08/21/22 1522 08/21/22 1746  BP: 132/70 128/78  Pulse: 78 80  Resp: 16 16  Temp: 98 F (36.7 C) 98 F (36.7 C)  SpO2: 99% 99%    Physical Exam Vitals and nursing note reviewed.  Constitutional:      General: Awake and alert. No acute distress.    Appearance: Normal appearance. The patient is normal weight.  HENT:     Head: Normocephalic and atraumatic.     Mouth: Mucous membranes are moist.  Eyes:     General: PERRL. Normal EOMs        Right eye: No discharge.        Left eye: No discharge.     Conjunctiva/sclera: Conjunctivae normal.  Cardiovascular:     Rate and Rhythm: Normal rate    Pulses: Normal pulses.     Heart sounds: Normal heart sounds Pulmonary:     Effort: Pulmonary effort is normal. No respiratory distress.     Breath sounds: Normal breath sounds.  Abdominal:     Abdomen is soft. There is no abdominal tenderness. No rebound or guarding. No distention. Musculoskeletal:         General: No swelling. Normal range of motion.     Cervical back: Normal range of motion and neck supple.  Back: No midline tenderness.  Mild tenderness palpation across her low back.  Strength and sensation 5/5 to bilateral lower extremities. Normal great toe extension against resistance. Normal sensation throughout feet. Normal patellar reflexes. Negative SLR and opposite SLR bilaterally.  Ambulatory with a steady gait Skin:    General: Skin is warm and dry.     Capillary Refill: Capillary refill takes less than 2 seconds.     Findings: Bilateral lower extremities wrapped in gauze Neurological:     Mental Status: The patient is awake and alert.      ED Results / Procedures / Treatments   Labs (all labs ordered are listed, but only abnormal results are displayed) Labs Reviewed  CBC WITH DIFFERENTIAL/PLATELET - Abnormal; Notable for the following components:      Result Value   WBC 11.0 (*)    RBC 3.70 (*)    Hemoglobin 9.6 (*)    HCT 32.3 (*)    MCH 25.9 (*)    MCHC 29.7 (*)    RDW 19.7 (*)    Neutro Abs 9.4 (*)    All other components within normal limits  BASIC METABOLIC PANEL - Abnormal; Notable for the following components:   CO2 19 (*)    All other components within normal limits  URINALYSIS, ROUTINE W REFLEX MICROSCOPIC     EKG     RADIOLOGY I independently reviewed and interpreted imaging and agree with radiologists findings.     PROCEDURES:  Critical Care performed:   Procedures   MEDICATIONS ORDERED IN ED: Medications  lidocaine (LIDODERM) 5 % 1 patch (1 patch Transdermal Patch Applied 08/21/22 1251)  morphine (PF) 4 MG/ML injection 4 mg (4 mg Intravenous Given 08/21/22 1253)  acetaminophen (TYLENOL) tablet 650 mg (650 mg Oral Given 08/21/22 1250)  sodium chloride 0.9 % bolus 500 mL (0 mLs Intravenous Stopped 08/21/22 1745)     IMPRESSION / MDM / ASSESSMENT AND PLAN / ED COURSE  I reviewed the triage vital signs and the nursing  notes.   Differential diagnosis includes, but is not limited to, acute on chronic back pain, compression fracture, metastatic disease, musculoskeletal injury.  No fevers, midline vertebral tenderness, signs or symptoms of cord compression or focal neurological deficits to suggest epidural abscess.  Labs are obtained and demonstrate a slight leukocytosis, the patient is currently being treated for cellulitis.  This appears to be improved from previous though there is some mild surrounding erythema.  She is taking antibiotics.  Compartments are soft and compressible, no lymphangitis, no crepitus, do not feel that she requires repeat admission for her cellulitis.  She denies any pain in this area.  Her legs were redressed.  She is followed by the wound care center and has an upcoming appointment that she plans on going to.  Does not appear to be any deep space infection.  This appears to be healing as expected.  Given her history of malignancy, CT scan was obtained for evaluation of osseous metastasis.  This was negative for acute bony abnormality, though does demonstrate significant degenerative changes.  She has 5 out of 5 strength with intact sensation to extensor hallucis dorsiflexion and plantarflexion of bilateral lower extremities with normal patellar reflexes bilaterally. No history or physical exam findings to suggest cauda equina syndrome or spinal cord compression. No focal neurological deficits on exam. No constitutional symptoms or history of immunosuppression or IVDA to suggest potential for epidural abscess. Not anticoagulated, no history of bleeding diastasis to suggest risk for epidural hematoma. No abdominal pain or flank pain to suggest kidney stone, no history of kidney stone.  No fever or dysuria or CVAT to suggest pyelonephritis .  No chest pain, back pain, shortness of breath, neurological deficits, to suggest vascular catastrophe, and pulses are equal in all 4 extremities.   Patient was  treated symptomatically with improvement of her symptoms.  Discussed care instructions and return precautions with patient. Recommended close outpatient follow-up for re-evaluation. Patient agrees with plan of care.  We discussed return precautions, specifically return for any worsening or different pain or development of any neurologic symptoms or worsening cellulitic changes, or development of constitutional symptoms.  Educated patient regarding expected time course for improvement and recommended very close outpatient follow-up.  Patient understands and agrees with plan.  She was discharged in stable condition.   Patient's presentation is most consistent with acute presentation with potential threat to life or bodily function.   Clinical Course as of 08/21/22 1804  Tue Aug 21, 2022  1622 Patient reports that she feels much better.  She had urinated but did not collect a sample.  There is no dysuria and she wishes to go home and does not wish to provide a urine sample. [JP]    Clinical Course User Index [JP] Kristan Votta, Clarnce Flock, PA-C     FINAL CLINICAL IMPRESSION(S) / ED DIAGNOSES   Final diagnoses:  Chronic bilateral low back pain without sciatica  Cellulitis of lower extremity, unspecified laterality     Rx / DC Orders   ED Discharge Orders     None        Note:  This document was prepared using Dragon voice recognition software and may include unintentional dictation errors.   Marquette Old, PA-C 08/21/22 1804    Lucillie Garfinkel, MD 08/21/22 [phone]

## 2022-08-21 NOTE — ED Notes (Signed)
Spoke with family   They will coma and pick her up

## 2022-08-22 ENCOUNTER — Encounter: Payer: Self-pay | Admitting: *Deleted

## 2022-08-22 ENCOUNTER — Other Ambulatory Visit: Payer: Self-pay

## 2022-08-22 ENCOUNTER — Emergency Department: Payer: PPO

## 2022-08-22 ENCOUNTER — Inpatient Hospital Stay
Admission: EM | Admit: 2022-08-22 | Discharge: 2022-08-25 | DRG: 871 | Disposition: A | Discharge status: Nursing Home (Includes ICF) | Payer: PPO | Source: Skilled Nursing Facility | Attending: Internal Medicine | Admitting: Internal Medicine

## 2022-08-22 DIAGNOSIS — K5792 Diverticulitis of intestine, part unspecified, without perforation or abscess without bleeding: Secondary | ICD-10-CM | POA: Diagnosis not present

## 2022-08-22 DIAGNOSIS — Z853 Personal history of malignant neoplasm of breast: Secondary | ICD-10-CM | POA: Diagnosis not present

## 2022-08-22 DIAGNOSIS — Z1152 Encounter for screening for COVID-19: Secondary | ICD-10-CM

## 2022-08-22 DIAGNOSIS — L89321 Pressure ulcer of left buttock, stage 1: Secondary | ICD-10-CM | POA: Diagnosis present

## 2022-08-22 DIAGNOSIS — E876 Hypokalemia: Secondary | ICD-10-CM | POA: Diagnosis present

## 2022-08-22 DIAGNOSIS — R6520 Severe sepsis without septic shock: Secondary | ICD-10-CM | POA: Diagnosis present

## 2022-08-22 DIAGNOSIS — L89311 Pressure ulcer of right buttock, stage 1: Secondary | ICD-10-CM | POA: Diagnosis present

## 2022-08-22 DIAGNOSIS — Z79899 Other long term (current) drug therapy: Secondary | ICD-10-CM

## 2022-08-22 DIAGNOSIS — A419 Sepsis, unspecified organism: Principal | ICD-10-CM

## 2022-08-22 DIAGNOSIS — M48061 Spinal stenosis, lumbar region without neurogenic claudication: Secondary | ICD-10-CM | POA: Diagnosis present

## 2022-08-22 DIAGNOSIS — R9431 Abnormal electrocardiogram [ECG] [EKG]: Secondary | ICD-10-CM | POA: Diagnosis present

## 2022-08-22 DIAGNOSIS — E039 Hypothyroidism, unspecified: Secondary | ICD-10-CM | POA: Diagnosis present

## 2022-08-22 DIAGNOSIS — G9341 Metabolic encephalopathy: Secondary | ICD-10-CM | POA: Diagnosis present

## 2022-08-22 DIAGNOSIS — N1330 Unspecified hydronephrosis: Secondary | ICD-10-CM | POA: Diagnosis present

## 2022-08-22 DIAGNOSIS — I5032 Chronic diastolic (congestive) heart failure: Secondary | ICD-10-CM | POA: Diagnosis present

## 2022-08-22 DIAGNOSIS — I110 Hypertensive heart disease with heart failure: Secondary | ICD-10-CM | POA: Diagnosis present

## 2022-08-22 DIAGNOSIS — K5732 Diverticulitis of large intestine without perforation or abscess without bleeding: Secondary | ICD-10-CM | POA: Diagnosis present

## 2022-08-22 DIAGNOSIS — R410 Disorientation, unspecified: Secondary | ICD-10-CM

## 2022-08-22 DIAGNOSIS — Z923 Personal history of irradiation: Secondary | ICD-10-CM | POA: Diagnosis not present

## 2022-08-22 DIAGNOSIS — G894 Chronic pain syndrome: Secondary | ICD-10-CM | POA: Diagnosis present

## 2022-08-22 DIAGNOSIS — L8990 Pressure ulcer of unspecified site, unspecified stage: Secondary | ICD-10-CM | POA: Insufficient documentation

## 2022-08-22 DIAGNOSIS — Z882 Allergy status to sulfonamides status: Secondary | ICD-10-CM | POA: Diagnosis not present

## 2022-08-22 DIAGNOSIS — M109 Gout, unspecified: Secondary | ICD-10-CM | POA: Diagnosis present

## 2022-08-22 DIAGNOSIS — Z9011 Acquired absence of right breast and nipple: Secondary | ICD-10-CM

## 2022-08-22 DIAGNOSIS — R1310 Dysphagia, unspecified: Secondary | ICD-10-CM

## 2022-08-22 DIAGNOSIS — E119 Type 2 diabetes mellitus without complications: Secondary | ICD-10-CM

## 2022-08-22 DIAGNOSIS — Z79890 Hormone replacement therapy (postmenopausal): Secondary | ICD-10-CM

## 2022-08-22 DIAGNOSIS — E7800 Pure hypercholesterolemia, unspecified: Secondary | ICD-10-CM | POA: Diagnosis present

## 2022-08-22 DIAGNOSIS — E1151 Type 2 diabetes mellitus with diabetic peripheral angiopathy without gangrene: Secondary | ICD-10-CM | POA: Diagnosis present

## 2022-08-22 DIAGNOSIS — D638 Anemia in other chronic diseases classified elsewhere: Secondary | ICD-10-CM | POA: Diagnosis present

## 2022-08-22 DIAGNOSIS — Z7984 Long term (current) use of oral hypoglycemic drugs: Secondary | ICD-10-CM

## 2022-08-22 LAB — COMPREHENSIVE METABOLIC PANEL
ALT: 11 U/L (ref 0–44)
AST: 31 U/L (ref 15–41)
Albumin: 3.1 g/dL — ABNORMAL LOW (ref 3.5–5.0)
Alkaline Phosphatase: 47 U/L (ref 38–126)
Anion gap: 10 (ref 5–15)
BUN: 11 mg/dL (ref 8–23)
CO2: 18 mmol/L — ABNORMAL LOW (ref 22–32)
Calcium: 8 mg/dL — ABNORMAL LOW (ref 8.9–10.3)
Chloride: 112 mmol/L — ABNORMAL HIGH (ref 98–111)
Creatinine, Ser: 0.99 mg/dL (ref 0.44–1.00)
GFR, Estimated: 58 mL/min — ABNORMAL LOW (ref >60)
Glucose, Bld: 134 mg/dL — ABNORMAL HIGH (ref 70–99)
Potassium: 3.4 mmol/L — ABNORMAL LOW (ref 3.5–5.1)
Sodium: 140 mmol/L (ref 135–145)
Total Bilirubin: 0.7 mg/dL (ref 0.3–1.2)
Total Protein: 6.7 g/dL (ref 6.5–8.1)

## 2022-08-22 LAB — CBC WITH DIFFERENTIAL/PLATELET
Abs Immature Granulocytes: 0.09 10*3/uL — ABNORMAL HIGH (ref 0.00–0.07)
Basophils Absolute: 0 10*3/uL (ref 0.0–0.1)
Basophils Relative: 0 %
Eosinophils Absolute: 0 10*3/uL (ref 0.0–0.5)
Eosinophils Relative: 0 %
HCT: 31.7 % — ABNORMAL LOW (ref 36.0–46.0)
Hemoglobin: 9.2 g/dL — ABNORMAL LOW (ref 12.0–15.0)
Immature Granulocytes: 1 %
Lymphocytes Relative: 6 %
Lymphs Abs: 1 10*3/uL (ref 0.7–4.0)
MCH: 25.8 pg — ABNORMAL LOW (ref 26.0–34.0)
MCHC: 29 g/dL — ABNORMAL LOW (ref 30.0–36.0)
MCV: 88.8 fL (ref 80.0–100.0)
Monocytes Absolute: 0.4 10*3/uL (ref 0.1–1.0)
Monocytes Relative: 2 %
Neutro Abs: 14.1 10*3/uL — ABNORMAL HIGH (ref 1.7–7.7)
Neutrophils Relative %: 91 %
Platelets: 375 10*3/uL (ref 150–400)
RBC: 3.57 MIL/uL — ABNORMAL LOW (ref 3.87–5.11)
RDW: 19.8 % — ABNORMAL HIGH (ref 11.5–15.5)
WBC: 15.6 10*3/uL — ABNORMAL HIGH (ref 4.0–10.5)
nRBC: 0 % (ref 0.0–0.2)

## 2022-08-22 LAB — C DIFFICILE QUICK SCREEN W PCR REFLEX
C Diff antigen: NEGATIVE
C Diff interpretation: NOT DETECTED
C Diff toxin: NEGATIVE

## 2022-08-22 LAB — RESP PANEL BY RT-PCR (FLU A&B, COVID) ARPGX2
Influenza A by PCR: NEGATIVE
Influenza B by PCR: NEGATIVE
SARS Coronavirus 2 by RT PCR: NEGATIVE

## 2022-08-22 LAB — URINALYSIS, COMPLETE (UACMP) WITH MICROSCOPIC
Bacteria, UA: NONE SEEN
Bilirubin Urine: NEGATIVE
Glucose, UA: NEGATIVE mg/dL
Hgb urine dipstick: NEGATIVE
Ketones, ur: NEGATIVE mg/dL
Leukocytes,Ua: NEGATIVE
Nitrite: NEGATIVE
Protein, ur: 100 mg/dL — AB
Specific Gravity, Urine: 1.008 (ref 1.005–1.030)
Squamous Epithelial / HPF: NONE SEEN (ref 0–5)
pH: 6 (ref 5.0–8.0)

## 2022-08-22 LAB — LACTIC ACID, PLASMA
Lactic Acid, Venous: 3.1 mmol/L (ref 0.5–1.9)
Lactic Acid, Venous: 2.5 mmol/L (ref 0.5–1.9)

## 2022-08-22 LAB — LIPASE, BLOOD: Lipase: 21 U/L (ref 11–51)

## 2022-08-22 LAB — MAGNESIUM: Magnesium: 1.4 mg/dL — ABNORMAL LOW (ref 1.7–2.4)

## 2022-08-22 LAB — PROTIME-INR
INR: 1.3 — ABNORMAL HIGH (ref 0.8–1.2)
Prothrombin Time: 15.9 seconds — ABNORMAL HIGH (ref 11.4–15.2)

## 2022-08-22 LAB — APTT: aPTT: 31 seconds (ref 24–36)

## 2022-08-22 MED ORDER — HYDROCODONE-ACETAMINOPHEN 7.5-325 MG PO TABS
1.0000 | ORAL_TABLET | Freq: Two times a day (BID) | ORAL | Status: DC | PRN
  Administered 2022-08-23 – 2022-08-24 (×3): 1 via ORAL
  Filled 2022-08-22 (×3): qty 1

## 2022-08-22 MED ORDER — PIPERACILLIN-TAZOBACTAM 3.375 G IVPB
3.3750 g | Freq: Three times a day (TID) | INTRAVENOUS | Status: DC
  Administered 2022-08-22 – 2022-08-24 (×6): 3.375 g via INTRAVENOUS
  Filled 2022-08-22 (×7): qty 50

## 2022-08-22 MED ORDER — LEVOTHYROXINE SODIUM 88 MCG PO TABS
88.0000 ug | ORAL_TABLET | Freq: Every day | ORAL | Status: DC
  Administered 2022-08-23 – 2022-08-25 (×3): 88 ug via ORAL
  Filled 2022-08-22 (×3): qty 1

## 2022-08-22 MED ORDER — MAGNESIUM SULFATE 2 GM/50ML IV SOLN
2.0000 g | Freq: Once | INTRAVENOUS | Status: AC
  Administered 2022-08-22: 2 g via INTRAVENOUS
  Filled 2022-08-22: qty 50

## 2022-08-22 MED ORDER — ALLOPURINOL 300 MG PO TABS
300.0000 mg | ORAL_TABLET | Freq: Every day | ORAL | Status: DC
  Administered 2022-08-23 – 2022-08-25 (×3): 300 mg via ORAL
  Filled 2022-08-22 (×3): qty 1

## 2022-08-22 MED ORDER — VITAMIN B-12 1000 MCG PO TABS
1000.0000 ug | ORAL_TABLET | Freq: Every day | ORAL | Status: DC
  Administered 2022-08-23 – 2022-08-25 (×3): 1000 ug via ORAL
  Filled 2022-08-22 (×3): qty 1

## 2022-08-22 MED ORDER — MORPHINE SULFATE (PF) 4 MG/ML IV SOLN
4.0000 mg | Freq: Once | INTRAVENOUS | Status: AC
  Administered 2022-08-22: 4 mg via INTRAVENOUS
  Filled 2022-08-22: qty 1

## 2022-08-22 MED ORDER — ACETAMINOPHEN 325 MG PO TABS
650.0000 mg | ORAL_TABLET | Freq: Four times a day (QID) | ORAL | Status: DC | PRN
  Administered 2022-08-24: 650 mg via ORAL
  Filled 2022-08-22: qty 2

## 2022-08-22 MED ORDER — POTASSIUM CHLORIDE IN NACL 20-0.45 MEQ/L-% IV SOLN
INTRAVENOUS | Status: AC
  Filled 2022-08-22: qty 1000

## 2022-08-22 MED ORDER — ACETAMINOPHEN 650 MG RE SUPP: 650.0000 mg | Freq: Four times a day (QID) | RECTAL | Status: DC | PRN

## 2022-08-22 MED ORDER — SODIUM CHLORIDE 0.9 % IV SOLN
2.0000 g | Freq: Once | INTRAVENOUS | Status: AC
  Administered 2022-08-22: 2 g via INTRAVENOUS
  Filled 2022-08-22: qty 12.5

## 2022-08-22 MED ORDER — MORPHINE SULFATE (PF) 2 MG/ML IV SOLN
2.0000 mg | INTRAVENOUS | Status: DC | PRN
  Administered 2022-08-22: 2 mg via INTRAVENOUS
  Filled 2022-08-22: qty 1

## 2022-08-22 MED ORDER — IOHEXOL 300 MG/ML  SOLN
100.0000 mL | Freq: Once | INTRAMUSCULAR | Status: AC | PRN
  Administered 2022-08-22: 100 mL via INTRAVENOUS

## 2022-08-22 MED ORDER — ENOXAPARIN SODIUM 40 MG/0.4ML IJ SOSY
40.0000 mg | PREFILLED_SYRINGE | INTRAMUSCULAR | Status: DC
  Administered 2022-08-23 – 2022-08-25 (×3): 40 mg via SUBCUTANEOUS
  Filled 2022-08-22 (×3): qty 0.4

## 2022-08-22 MED ORDER — ATENOLOL 25 MG PO TABS
50.0000 mg | ORAL_TABLET | Freq: Every day | ORAL | Status: DC
  Administered 2022-08-23 – 2022-08-25 (×3): 50 mg via ORAL
  Filled 2022-08-22 (×4): qty 2

## 2022-08-22 MED ORDER — LACTATED RINGERS IV BOLUS
1000.0000 mL | Freq: Once | INTRAVENOUS | Status: AC
  Administered 2022-08-22: 1000 mL via INTRAVENOUS

## 2022-08-22 NOTE — ED Provider Notes (Signed)
[facility] Provider Note    Event Date/Time   First MD Initiated Contact with Patient 08/22/22 0725     (approximate)   History   Chief Complaint: Altered mental status  HPI  [name] is a 81 y.o. female with a history of diabetes, CHF, lower extremity edema, sent to the ED from [name] home due to altered mental status.  Was seen in the ED yesterday for chronic back pain, causing her to miss her wound care appointment her bilateral lower extremities.  EMS report new urinary incontinence and stool incontinence.  Patient denies any pain.  Initially had blood pressure of 70/40 with a tachycardia of 110, axillary temp of 99.4.  Blood pressure improved after 2 L IV fluids given by EMS prior to arrival.     Physical Exam   Triage Vital Signs: ED Triage Vitals [08/22/22 0715]  Enc Vitals Group     BP 129/71     Pulse Rate (!) 110     Resp 18     Temp 98.2 F (36.8 C)     Temp Source Oral     SpO2 100 %     Weight      Height      Head Circumference      Peak Flow      Pain Score      Pain Loc      Pain Edu?      Excl. in [place]?     Most recent vital signs: Vitals:   08/22/22 0930 08/22/22 1215  BP:  (!) 122/90  Pulse: 93 (!) 101  Resp: 17 19  Temp:  99.6 F (37.6 C)  SpO2: 100% 95%    General: Awake, no distress.  CV:  Good peripheral perfusion.  Tachycardia heart rate 105 Resp:  Normal effort.  Clear to auscultation bilaterally Abd:  No distention.  Soft with suprapubic tenderness.  Not peritoneal Other:  No lower extremity edema or calf tenderness.  Small, 1 cm superficial wound on bilateral legs just above the ankle on each side.  No significant inflammatory changes.  No purulent drainage crepitus or lymphangitis or induration.   ED Results / Procedures / Treatments   Labs (all labs ordered are listed, but only abnormal results are displayed) Labs Reviewed  LACTIC ACID, PLASMA - Abnormal; Notable for the  following components:      Result Value   Lactic Acid, Venous 3.1 (*)    All other components within normal limits  COMPREHENSIVE METABOLIC PANEL - Abnormal; Notable for the following components:   Potassium 3.4 (*)    Chloride 112 (*)    CO2 18 (*)    Glucose, Bld 134 (*)    Calcium 8.0 (*)    Albumin 3.1 (*)    GFR, Estimated 58 (*)    All other components within normal limits  CBC WITH DIFFERENTIAL/PLATELET - Abnormal; Notable for the following components:   WBC 15.6 (*)    RBC 3.57 (*)    Hemoglobin 9.2 (*)    HCT 31.7 (*)    MCH 25.8 (*)    MCHC 29.0 (*)    RDW 19.8 (*)    Neutro Abs 14.1 (*)    Abs Immature Granulocytes 0.09 (*)    All other components within normal limits  PROTIME-INR - Abnormal; Notable for the following components:   Prothrombin Time 15.9 (*)    INR 1.3 (*)    All other components within  normal limits  URINALYSIS, COMPLETE (UACMP) WITH MICROSCOPIC - Abnormal; Notable for the following components:   Color, Urine STRAW (*)    APPearance CLEAR (*)    Protein, ur 100 (*)    All other components within normal limits  RESP PANEL BY RT-PCR (FLU A&B, COVID) ARPGX2  C DIFFICILE QUICK SCREEN W PCR REFLEX    CULTURE, BLOOD (ROUTINE X 2)  CULTURE, BLOOD (ROUTINE X 2)  URINE CULTURE  APTT  LIPASE, BLOOD  LACTIC ACID, PLASMA     EKG    RADIOLOGY CT abdomen pelvis interpreted by me, negative for free air or bowel obstruction.  There is hydronephrosis.  Evidence of diverticulitis in the descending colon.  Radiology report reviewed.   PROCEDURES:  .Critical Care  Performed by: Carrie Mew, MD Authorized by: Carrie Mew, MD   Critical care provider statement:    Critical care time (minutes):  35   Critical care time was exclusive of:  Separately billable procedures and treating other patients   Critical care was necessary to treat or prevent imminent or life-threatening deterioration of the following conditions:  Sepsis and shock    Critical care was time spent personally by me on the following activities:  Development of treatment plan with patient or surrogate, discussions with consultants, evaluation of patient's response to treatment, examination of patient, obtaining history from patient or surrogate, ordering and performing treatments and interventions, ordering and review of laboratory studies, ordering and review of radiographic studies, pulse oximetry, re-evaluation of patient's condition and review of old charts   Care discussed with: admitting provider   Comments:        .1-3 Lead EKG Interpretation  Performed by: Carrie Mew, MD Authorized by: Carrie Mew, MD     Interpretation: abnormal     ECG rate:  100   ECG rate assessment: tachycardic     Rhythm: sinus tachycardia     Ectopy: none     Conduction: normal      MEDICATIONS ORDERED IN ED: Medications  ceFEPIme (MAXIPIME) 2 g in sodium chloride 0.9 % 100 mL IVPB (0 g Intravenous Stopped 08/22/22 0906)  lactated ringers bolus 1,000 mL (0 mLs Intravenous Stopped 08/22/22 0937)  iohexol (OMNIPAQUE) 300 MG/ML solution 100 mL (100 mLs Intravenous Contrast Given 08/22/22 1010)  morphine (PF) 4 MG/ML injection 4 mg (4 mg Intravenous Given 08/22/22 1224)     IMPRESSION / MDM / [place] / ED COURSE  I reviewed the triage vital signs and the nursing notes.                              Differential diagnosis includes, but is not limited to, cystitis, pyelonephritis, C. difficile colitis, diverticulitis, sepsis, AKI, electrolyte abnormality, dehydration  Patient's presentation is most consistent with acute presentation with potential threat to life or bodily function.  Patient presents with fever, tachycardia, hypotension concerning for septic shock.  She has already received 30 mL/kg IV fluid bolus by EMS with normalization of blood pressure.  We will continue to monitor hemodynamics obtaining sepsis work-up, give empiric cefepime  for likely complicated UTI.  She had a CT lumbar spine yesterday which showed severe chronic disease but no acute abnormality.   ----------------------------------------- 12:37 PM on 08/22/2022 ----------------------------------------- Foley inserted due to possible bladder outlet obstruction and hydronephrosis.  She is draining clear urine.  CT shows diverticulitis.  Case discussed with hospitalist for further management.  FINAL CLINICAL IMPRESSION(S) / ED DIAGNOSES   Final diagnoses:  Acute diverticulitis  Confusion  Sepsis, due to unspecified organism, unspecified whether acute organ dysfunction present [facility])     Rx / DC Orders   ED Discharge Orders     None        Note:  This document was prepared using Dragon voice recognition software and may include unintentional dictation errors.   Carrie Mew, MD 08/22/22 [phone]

## 2022-08-22 NOTE — ED Notes (Signed)
EDP present on arrival

## 2022-08-22 NOTE — H&P (Addendum)
History and Physical:    [name]   LEX:[id number] DOB: [birth year] DOA: 08/22/2022  Referring MD/provider: Brenton Grills, MD PCP: Tracie Harrier, MD   Patient coming from: Home  Chief Complaint: Change in mental status  History of Present Illness:   [name] is a 82 y.o. female with medical history significant for recent discharge from the hospital on 05/18/2022 after hospitalization for cellulitis of the lower extremities (treated with IV ceftriaxone), hypertension, hyperlipidemia, diabetes mellitus, hypothyroidism, gout, breast cancer s/p radiation and mastectomy, PVD, iron deficiency anemia, chronic diastolic CHF. She was seen in the emergency room on 08/21/2022 for back pain and was discharged home.  She presented to the emergency department again on 08/22/2022 because of change in mental status.  [name], her son, was at the bedside and he provided most of the day history.  He said that patient felt very warm to touch and was shivering this morning.  He did not check her temperature.  Patient normally gets around in a wheelchair.  This morning she fell when she tried to go from her wheelchair to the toilet.  She was incontinent of urine and feces which is unusual for her.  She was also confused and was talking out of her head.  No vomiting, abdominal pain, chest pain, shortness of breath or loss of consciousness.   ED Course:  The patient was tachycardic with heart rate of 110, tachypneic with respiratory rate of 23 but temperature was normal at 98.2.  WBC was 15.6 and lactic acid was 3.1.  CT abdomen pelvis showed diverticulitis.  She was given IV Ringer's lactate bolus, IV morphine for pain and IV cefepime in the emergency room.  Foley catheter was placed in the ED because of distended bladder and moderate bilateral hydroureteronephrosis on CT abdomen pelvis.  ROS:   ROS all other systems reviewed were negative  Past Medical History:   Past Medical  History:  Diagnosis Date   Arthritis    Arthritis    Breast cancer ([facility]) 1992   right breast with lumpectomy and rad tx   Cancer of right breast ([facility]) 04/16/2013   right breast with mastectomy   Diabetes mellitus without complication ([facility])    Gout    High cholesterol    Hyperlipidemia    Hypertension    Personal history of radiation therapy     Past Surgical History:   Past Surgical History:  Procedure Laterality Date   ABDOMINAL HYSTERECTOMY     ANKLE FRACTURE SURGERY Right 2004   BREAST LUMPECTOMY Right 1992   positive   MASTECTOMY Right 2014   SHOULDER SURGERY Right 2010    Social History:   Social History   Socioeconomic History   Marital status: Married    Spouse name: Not on file   Number of children: 2   Years of education: Not on file   Highest education level: Not on file  Occupational History   Not on file  Tobacco Use   Smoking status: Never   Smokeless tobacco: Never  Vaping Use   Vaping Use: Never used  Substance and Sexual Activity   Alcohol use: No    Alcohol/week: 0.0 standard drinks of alcohol   Drug use: No   Sexual activity: Not Currently  Other Topics Concern   Not on file  Social History Narrative   Not on file   Social Determinants of Health   Financial Resource Strain: Not on file  Food Insecurity: No Food Insecurity (08/17/2022)  Hunger Vital Sign    Worried About Running Out of Food in the Last Year: Never true    Ran Out of Food in the Last Year: Never true  Transportation Needs: No Transportation Needs (08/17/2022)   PRAPARE - Hydrologist (Medical): No    Lack of Transportation (Non-Medical): No  Physical Activity: Not on file  Stress: Not on file  Social Connections: Not on file  Intimate Partner Violence: Not At Risk (08/17/2022)   Humiliation, Afraid, Rape, and Kick questionnaire    Fear of Current or Ex-Partner: No    Emotionally Abused: No    Physically Abused: No    Sexually Abused: No     Allergies   Other and Sulfa antibiotics  Family history:   Family History  Problem Relation Age of Onset   Diabetes Father    Breast cancer Neg Hx     Current Medications:   Prior to Admission medications   Medication Sig Start Date End Date Taking? Authorizing Provider  allopurinol (ZYLOPRIM) 300 MG tablet Take 300 mg by mouth daily.   Yes [provider]  atenolol (TENORMIN) 50 MG tablet Take 50 mg by mouth daily.    Yes [provider]  gentamicin cream (GARAMYCIN) 0.1 % Apply 1 Application topically in the morning and at bedtime.   Yes [provider]  metFORMIN (GLUCOPHAGE) 1000 MG tablet Take 1,000 mg by mouth in the morning and at bedtime.   Yes [provider]  acetaminophen (TYLENOL) 500 MG tablet Take 500-1,000 mg by mouth 4 (four) times daily as needed for mild pain or moderate pain.    [provider]  atorvastatin (LIPITOR) 40 MG tablet Take 40 mg by mouth daily.     [provider]  diphenoxylate-atropine (LOMOTIL) 2.5-0.025 MG tablet Take 1-2 tablets by mouth 3 (three) times daily as needed. 08/20/22   [provider]  furosemide (LASIX) 40 MG tablet Take 40 mg by mouth daily.    [provider]  HYDROcodone-acetaminophen (NORCO) 7.5-325 MG tablet Take 1 tablet by mouth 2 (two) times daily as needed for moderate pain or severe pain.    [provider]  levothyroxine (SYNTHROID) 88 MCG tablet Take 88 mcg by mouth daily.    [provider]  linezolid (ZYVOX) 600 MG tablet Take 1 tablet (600 mg total) by mouth every 12 (twelve) hours for 7 days. 08/18/22 08/25/22  Sidney Ace, MD  sitaGLIPtin (JANUVIA) 50 MG tablet Take 50 mg by mouth daily.    [provider]  sulfamethoxazole-trimethoprim (BACTRIM DS) 800-160 MG tablet Take 1 tablet by mouth 2 (two) times daily. 08/20/22   [provider]  vitamin B-12 (CYANOCOBALAMIN) 1000 MCG tablet Take 1,000 mcg by  mouth daily.    [provider]    Physical Exam:   Vitals:   08/22/22 0915 08/22/22 0930 08/22/22 1215 08/22/22 1328  BP:   (!) 122/90 (!) 130/93  Pulse: 93 93 (!) 101 87  Resp: '19 17 19 17  '$ Temp:   99.6 F (37.6 C)   TempSrc:      SpO2: 98% 100% 95% 95%  Weight:      Height:         Physical Exam: Blood pressure (!) 130/93, pulse 87, temperature 99.6 F (37.6 C), resp. rate 17, height '5\' 4"'$  (1.626 m), weight 55.3 kg, SpO2 95 %. Gen: No acute distress. Head: Normocephalic, atraumatic. Eyes: Pupils equal, round and reactive to  light. Extraocular movements intact.  Sclerae nonicteric.  Mouth: Moist mucous membranes.   Neck: Supple, no thyromegaly, no lymphadenopathy, no jugular venous distention. Chest: Lungs are clear to auscultation with good air movement. No rales, rhonchi or wheezes.  CV: Heart sounds are regular with an S1, S2. No murmurs, rubs or gallops.  Abdomen: Soft, nontender, nondistended with normal active bowel sounds. No palpable masses. Extremities: Extremities are without clubbing, or cyanosis. No edema. Pedal pulses 2+.  Skin: Warm and dry.  Superficial wound on left lateral leg.  Mild erythema of left leg without tenderness Neuro: Alert and oriented times 3; grossly nonfocal.  Psych: Insight is good and judgment is appropriate. Mood and affect normal.         Data Review:    Labs: Basic Metabolic Panel: Recent Labs  Lab 08/16/22 1347 08/21/22 1247 08/22/22 0740  NA 139 140 140  K 4.3 4.0 3.4*  CL 107 107 112*  CO2 23 19* 18*  GLUCOSE 95 99 134*  BUN '14 13 11  '$ CREATININE 0.87 0.95 0.99  CALCIUM 9.2 9.6 8.0*   Liver Function Tests: Recent Labs  Lab 08/16/22 1347 08/22/22 0740  AST 18 31  ALT 9 11  ALKPHOS 54 47  BILITOT 0.4 0.7  PROT 7.3 6.7  ALBUMIN 3.6 3.1*   Recent Labs  Lab 08/22/22 0740  LIPASE 21   No results for input(s): "AMMONIA" in the last 168 hours. CBC: Recent Labs  Lab 08/16/22 1347 08/17/22 0433  08/21/22 1247 08/22/22 0740  WBC 9.6 7.9 11.0* 15.6*  NEUTROABS 6.4  --  9.4* 14.1*  HGB 9.7* 8.6* 9.6* 9.2*  HCT 32.7* 28.8* 32.3* 31.7*  MCV 87.2 86.0 87.3 88.8  PLT 367 302 390 375   Cardiac Enzymes: No results for input(s): "CKTOTAL", "CKMB", "CKMBINDEX", "TROPONINI" in the last 168 hours.  BNP (last 3 results) No results for input(s): "PROBNP" in the last 8760 hours. CBG: Recent Labs  Lab 08/17/22 1219 08/17/22 1729 08/17/22 2254 08/18/22 0929 08/18/22 1254  GLUCAP 122* 149* 110* 219* 119*    Urinalysis    Component Value Date/Time   COLORURINE STRAW (A) 08/22/2022 0853   APPEARANCEUR CLEAR (A) 08/22/2022 0853   APPEARANCEUR Cloudy (A) 09/08/2018 1519   LABSPEC 1.008 08/22/2022 0853   LABSPEC 1.016 05/20/2012 1345   PHURINE 6.0 08/22/2022 0853   GLUCOSEU NEGATIVE 08/22/2022 0853   GLUCOSEU Negative 05/20/2012 1345   HGBUR NEGATIVE 08/22/2022 0853   BILIRUBINUR NEGATIVE 08/22/2022 0853   BILIRUBINUR Negative 09/08/2018 1519   BILIRUBINUR Negative 05/20/2012 1345   KETONESUR NEGATIVE 08/22/2022 0853   PROTEINUR 100 (A) 08/22/2022 0853   NITRITE NEGATIVE 08/22/2022 0853   LEUKOCYTESUR NEGATIVE 08/22/2022 0853   LEUKOCYTESUR 3+ 05/20/2012 1345      Radiographic Studies: CT ABDOMEN PELVIS W CONTRAST  Result Date: 08/22/2022 CLINICAL DATA:  Abdominal pain, acute, nonlocalized. Sepsis. Bilateral lower extremity cellulitis. Incontinent of bowel and bladder. Hyperglycemia. EXAM: CT ABDOMEN AND PELVIS WITH CONTRAST TECHNIQUE: Multidetector CT imaging of the abdomen and pelvis was performed using the standard protocol following bolus administration of intravenous contrast. RADIATION DOSE REDUCTION: This exam was performed according to the departmental dose-optimization program which includes automated exposure control, adjustment of the mA and/or kV according to patient size and/or use of iterative reconstruction technique. CONTRAST:  157m OMNIPAQUE IOHEXOL 300 MG/ML   SOLN COMPARISON:  AP chest 08/22/2022, CT abdomen pelvis without contrast 09/26/2018 FINDINGS: Lower chest: Mild ground-glass likely subsegmental atelectasis within the bilateral posterior lung bases.  Partial visualization of postsurgical changes of prior mastectomy again noted. Heart size is again mildly to moderately enlarged. Moderate mitral annular calcifications. Trace pericardial fluid is similar to prior. Hepatobiliary: Smooth liver contours. Status post cholecystectomy. Minimal central intrahepatic biliary ductal dilatation, presumably postsurgical. Pancreas: Large pancreatic parenchymal atrophy, similar to prior. No pancreatic mass or inflammatory change is seen. No pancreatic ductal dilatation. Spleen: Normal in size without focal abnormality. Adrenals/Urinary Tract: Normal bilateral adrenals. The urinary bladder is moderately to markedly distended. No focal urinary bladder wall thickening. Mild edema/fat stranding peripheral to the anterior left aspect of the urinary bladder (axial series 2, image 65, coronal series 5, image 18). There is moderate distention of bilateral ureters diffusely, down to the bilateral ureterovesicular junctions. New moderate bilateral hydronephrosis. Redemonstration of normal variant bilateral extrarenal pelves. There are again moderate left and mild right areas of renal cortical atrophy and scarring. New mild bilateral perinephric fat stranding. Stomach/Bowel: There is diffuse moderate thickening of the sigmoid and distal descending colon walls. Mild-to-moderate scattered diverticula in these regions. Findings are suspicious for acute diverticulitis. No bowel perforation or abscess is seen. The terminal ileum is unremarkable. A small blind-ending tubular structure favored to represent the normal appendix is seen on axial series 2 images 49 through 52. No dilated loops of bowel to indicate bowel obstruction. Vascular/Lymphatic: The abdominal aortic aneurysm. The major  intra-abdominal aortic branch vessels are patent. Mild-to-moderate atherosclerotic calcifications. No mesenteric, retroperitoneal, or pelvic pathologically enlarged lymph nodes by CT criteria. Reproductive: The uterus is surgically absent. No gross adnexal abnormality is seen. Other: Unchanged moderate fat containing umbilical hernia measuring up to 2.4 cm in transverse dimension. Trace free fluid within the pelvis. No pneumoperitoneum. Musculoskeletal: Mild levocurvature of the thoracolumbar junction mild dextrocurvature centered at L4. Mild retrolisthesis of L1 on L2 and L2 on L3. Severe L1-2 and L2-3 disc space narrowing. Grade 1 anterolisthesis of L4 on L5. Severe multilevel facet joint degenerative changes. IMPRESSION: 1. Moderate to markedly distended urinary bladder with new moderate bilateral hydroureteronephrosis. This may be secondary to partial urinary bladder outlet obstruction. There is mild edema/fat stranding peripheral to the anterior left aspect of the urinary bladder, possible fairly cystitis. Recommend correlation with urinalysis. 2. Mild-to-moderate sigmoid diverticulosis with inflammatory changes suggesting diffuse sigmoid and distal descending colon acute diverticulitis. No bowel perforation or drainable fluid collection. 3. Additional chronic findings as above. Aortic Atherosclerosis (ICD10-I70.0). Electronically Signed   By: Yvonne Kendall M.D.   On: 08/22/2022 10:40   DG Chest Port 1 View  Result Date: 08/22/2022 CLINICAL DATA:  81 year old female with possible sepsis. Altered mental status and lower extremity cellulitis. EXAM: PORTABLE CHEST 1 VIEW COMPARISON:  Portable chest 05/26/2012. lumbar spine CT yesterday. FINDINGS: Portable AP upright view at 0735 hours. Low lung volumes. But left lung base ventilation appears improved since 2013 and Allowing for portable technique the lungs are clear. Mediastinal contours are within normal limits. Visualized tracheal air column is within  normal limits. No pneumothorax or pleural effusion identified. No acute osseous abnormality identified. Negative visible bowel gas. IMPRESSION: Low lung volumes, otherwise no acute cardiopulmonary abnormality. Electronically Signed   By: Genevie Ann M.D.   On: 08/22/2022 07:46   CT Lumbar Spine Wo Contrast  Result Date: 08/21/2022 CLINICAL DATA:  Increased back pain. EXAM: CT LUMBAR SPINE WITHOUT CONTRAST TECHNIQUE: Multidetector CT imaging of the lumbar spine was performed without intravenous contrast administration. Multiplanar CT image reconstructions were also generated. RADIATION DOSE REDUCTION: This exam was performed according to the  departmental dose-optimization program which includes automated exposure control, adjustment of the mA and/or kV according to patient size and/or use of iterative reconstruction technique. COMPARISON:  04/18/09 MRI L Spine FINDINGS: Segmentation: 5 lumbar type vertebrae. Alignment: Retrolisthesis of L2 L2-L3.  Grade 1 L4 on L5. Vertebrae: There are multilevel degenerative endplate changes. No evidence of fracture. Paraspinal and other soft tissues: Extrarenal pelvis bilaterally. There is fatty atrophy of the paraspinal musculature. Disc levels: T11-T12: No evidence of spinal canal or neural foraminal stenosis. T12-L1: Small disc bulge. No definite evidence of spinal or neural foraminal stenosis. L1-L2: Moderate spinal canal stenosis. Moderate bilateral neural foraminal stenosis. Severe bilateral facet degenerative change. L2-L3: Severe spinal canal stenosis. Severe bilateral neural foraminal stenosis. Severe bilateral facet degenerative change. L3-L4: Severe spinal canal stenosis. Mild-to-moderate bilateral neural foraminal stenosis. Severe bilateral facet degenerative change. L4-L5: Severe spinal canal stenosis. Mild bilateral neural foraminal stenosis. Severe bilateral facet degenerative change. L5-S1: No significant spinal canal stenosis. Mild bilateral neural foraminal  narrowing. Severe bilateral facet degenerative change. IMPRESSION: 1. Severe spinal canal stenosis at L2-L3, L3-L4, and L4-L5. These findings have progressed from 2010. Consider further evaluation with a lumbar spine MRI, as clinically indicated. 2. Severe bilateral neural foraminal stenosis at L2-L3 and L3-L4. 3. No acute fracture. Electronically Signed   By: Marin Roberts M.D.   On: 08/21/2022 14:30    EKG: Independently reviewed by me showed sinus tachycardia, prolonged QTc 511, low voltage.    Assessment/Plan:   Principal Problem:   Severe sepsis (HCC) Active Problems:   Acute diverticulitis   Chronic diastolic CHF (congestive heart failure) (HCC)   Diabetes mellitus without complication (HCC)   Acute metabolic encephalopathy   Hydroureteronephrosis   Hypokalemia    Body mass index is 20.94 kg/m.   Severe sepsis secondary to acute sigmoid and distal colonic diverticulitis: Admit to MedSurg and monitor on telemetry.  Keep NPO. Treat with IV fluids and empiric IV antibiotics.  Use analgesics as needed for pain.  Lactic acid improved from 3.1-2.5.  Acute metabolic encephalopathy, improving.  Her son said that her mental status is improving.  She still has intermittent confusion.  Distended bladder, bilateral moderate hydroureteronephrosis: Leave Foley catheter in place for now.  Hypokalemia: Replete potassium and monitor levels  Hypomagnesemia: Magnesium level was 1.4.  Replete magnesium with IV magnesium sulfate.  Prolonged QTc interval 511: repeat EKG tomorrow  Chronic diastolic CHF: Compensated.  Hold Lasix  Type II DM: Hold metformin and Januvia.  Recent lower extremity cellulitis: Hold Zyvox and Bactrim.    Other information:   DVT prophylaxis: Lovenox  Code Status: Full code. Family Communication: [name], son, at the bedside Disposition Plan: Plan to discharge home in 2 to 3 days Consults called: None Admission status: Inpatient  The medical decision  making on this patient was of high complexity and the patient is at high risk for clinical deterioration, therefore this is a level 3 visit.    Nashia Remus [facility] Pager: Please check [email or web address]   How to contact the [facility] Attending or Consulting provider [place] or covering provider during after hours [place], for this patient?   Check the care team in [facility] and look for a) attending/consulting [facility] provider listed and b) the [facility] team listed Log into [email or web address] and use [place]'s universal password to access. If you do not have the password, please contact the hospital operator. Locate the [facility] provider you are looking for under [facility] and page to a number that  you can be directly reached. If you still have difficulty reaching the provider, please page the [facility] (Director on Call) for the Hospitalists listed on amion for assistance.  08/22/2022, 2:29 PM

## 2022-08-22 NOTE — Sepsis Progress Note (Signed)
Notified bedside nurse of need to draw repeat lactic acid. 

## 2022-08-22 NOTE — Progress Notes (Signed)
PHARMACY -  BRIEF ANTIBIOTIC NOTE   Pharmacy has received consult(s) for cefepime from an ED provider.  The patient's profile has been reviewed for ht/wt/allergies/indication/available labs.    One time order(s) placed for 2 grams IV cefepime x 1  Further antibiotics/pharmacy consults should be ordered by admitting physician if indicated.                       Thank you, Dallie Piles 08/22/2022  7:29 AM

## 2022-08-22 NOTE — ED Notes (Signed)
Fall bracelet on, bed alarm on and pts bed at lowest position. Non-slip socks placed on pts feet. Pts placed on 2L oxygen by nasal cannula due to mouth breathing while sleeping. Foley cath bag emptied.

## 2022-08-22 NOTE — ED Notes (Signed)
This RN took critical value of LA of 3.1, Primary RN Roderic Palau made aware of result

## 2022-08-22 NOTE — ED Notes (Signed)
EKG complete, blood and nasal swab sent. 2L IVF bolus PTA.

## 2022-08-22 NOTE — Consult Note (Signed)
Pharmacy Antibiotic Note  [name] is a 81 y.o. female admitted on 08/22/2022 with  intra-abdominal infection .  Pharmacy has been consulted for zosyn dosing.  Plan: Zosyn 3.375g IV q8h (4 hour infusion). Continue to monitor and dose adjust antibiotics according to renal function and indication   Height: '5\' 4"'$  (162.6 cm) Weight: 55.3 kg (122 lb) IBW/kg (Calculated) : 54.7  Temp (24hrs), Avg:98.5 F (36.9 C), Min:98 F (36.7 C), Max:99.6 F (37.6 C)  Recent Labs  Lab 08/16/22 1347 08/17/22 0433 08/21/22 1247 08/22/22 0740 08/22/22 1201  WBC 9.6 7.9 11.0* 15.6*  --   CREATININE 0.87  --  0.95 0.99  --   LATICACIDVEN 1.8  --   --  3.1* 2.5*    Estimated Creatinine Clearance: 39.8 mL/min (by C-G formula based on SCr of 0.99 mg/dL).    Allergies  Allergen Reactions   Other Anaphylaxis    Anesthisia has been a health issue for her in the past.    Sulfa Antibiotics Rash    Antimicrobials this admission:  10/11 Cefepime x1  10/11 Zosyn >>   Microbiology results: 10/11 BCx: sent 10/11 UCx: sent   Thank you for allowing pharmacy to be a part of this patient's care.  Darrick Penna 08/22/2022 2:39 PM

## 2022-08-22 NOTE — ED Triage Notes (Addendum)
BIB EMS from [facility] for AMS with signs of sepsis and BLE cellulitis. Supposed to have been seen by [facility] RN, yesterday, but came to this ED for back pain instead. Mentation improved after 2L IVF PTA. Incontinent of bowel and bladder. CBG 227. NSL 18g x2. Initial BP 70/40, up to 110/70. Axillary temp 99.4. EDP at [facility] on arrival.

## 2022-08-22 NOTE — ED Notes (Signed)
Pt up with this NT to the bedside commode. Pt noncompliant with instructions on getting out of bed and to commode. Brief changed and pt cleaned.

## 2022-08-22 NOTE — ED Notes (Signed)
pCXR at [facility]

## 2022-08-22 NOTE — ED Notes (Signed)
Pt alert, NAD, calm, interactive. To CT.

## 2022-08-22 NOTE — Sepsis Progress Note (Signed)
eLink monitoring code sepsis.  

## 2022-08-23 DIAGNOSIS — I5032 Chronic diastolic (congestive) heart failure: Secondary | ICD-10-CM | POA: Diagnosis not present

## 2022-08-23 DIAGNOSIS — L8990 Pressure ulcer of unspecified site, unspecified stage: Secondary | ICD-10-CM | POA: Insufficient documentation

## 2022-08-23 DIAGNOSIS — K5792 Diverticulitis of intestine, part unspecified, without perforation or abscess without bleeding: Secondary | ICD-10-CM | POA: Diagnosis not present

## 2022-08-23 DIAGNOSIS — A419 Sepsis, unspecified organism: Secondary | ICD-10-CM | POA: Diagnosis not present

## 2022-08-23 DIAGNOSIS — G9341 Metabolic encephalopathy: Secondary | ICD-10-CM | POA: Diagnosis not present

## 2022-08-23 LAB — FERRITIN: Ferritin: 85 ng/mL (ref 11–307)

## 2022-08-23 LAB — IRON AND TIBC
Iron: 32 ug/dL (ref 28–170)
Saturation Ratios: 15 % (ref 10.4–31.8)
TIBC: 220 ug/dL — ABNORMAL LOW (ref 250–450)
UIBC: 188 ug/dL

## 2022-08-23 LAB — BASIC METABOLIC PANEL
Anion gap: 8 (ref 5–15)
BUN: 10 mg/dL (ref 8–23)
CO2: 23 mmol/L (ref 22–32)
Calcium: 7.9 mg/dL — ABNORMAL LOW (ref 8.9–10.3)
Chloride: 109 mmol/L (ref 98–111)
Creatinine, Ser: 0.83 mg/dL (ref 0.44–1.00)
GFR, Estimated: >60 mL/min (ref >60)
Glucose, Bld: 95 mg/dL (ref 70–99)
Potassium: 3.4 mmol/L — ABNORMAL LOW (ref 3.5–5.1)
Sodium: 140 mmol/L (ref 135–145)

## 2022-08-23 LAB — CBC
HCT: 24.6 % — ABNORMAL LOW (ref 36.0–46.0)
Hemoglobin: 7.3 g/dL — ABNORMAL LOW (ref 12.0–15.0)
MCH: 26.1 pg (ref 26.0–34.0)
MCHC: 29.7 g/dL — ABNORMAL LOW (ref 30.0–36.0)
MCV: 87.9 fL (ref 80.0–100.0)
Platelets: 290 10*3/uL (ref 150–400)
RBC: 2.8 MIL/uL — ABNORMAL LOW (ref 3.87–5.11)
RDW: 19.6 % — ABNORMAL HIGH (ref 11.5–15.5)
WBC: 9.1 10*3/uL (ref 4.0–10.5)
nRBC: 0 % (ref 0.0–0.2)

## 2022-08-23 LAB — GLUCOSE, CAPILLARY
Glucose-Capillary: 76 mg/dL (ref 70–99)
Glucose-Capillary: 92 mg/dL (ref 70–99)

## 2022-08-23 LAB — URINE CULTURE: Culture: NO GROWTH

## 2022-08-23 LAB — MAGNESIUM: Magnesium: 2.1 mg/dL (ref 1.7–2.4)

## 2022-08-23 LAB — VITAMIN B12: Vitamin B-12: 827 pg/mL (ref 180–914)

## 2022-08-23 MED ORDER — POTASSIUM CHLORIDE CRYS ER 20 MEQ PO TBCR
40.0000 meq | EXTENDED_RELEASE_TABLET | ORAL | Status: AC
  Administered 2022-08-23 (×2): 40 meq via ORAL
  Filled 2022-08-23 (×2): qty 2

## 2022-08-23 NOTE — Progress Notes (Addendum)
Progress Note    [name]  TOI:[id number] DOB: 1941-03-10  DOA: 08/22/2022 PCP: Tracie Harrier, MD      Brief Narrative:    Medical records reviewed and are as summarized below:  [name] is a 81 y.o. female with medical history significant for recent discharge from the hospital on 05/18/2022 after hospitalization for cellulitis of the lower extremities (treated with IV ceftriaxone), hypertension, hyperlipidemia, diabetes mellitus, hypothyroidism, gout, breast cancer s/p radiation and mastectomy, PVD, iron deficiency anemia, chronic diastolic CHF. She was seen in the emergency room on 08/21/2022 for back pain and was discharged home.  She presented to the emergency department again on 08/22/2022 because of change in mental status.  Roderic Palau, her son, was at the bedside and he provided most of the day history.  He said that patient felt very warm to touch and was shivering this morning.  He did not check her temperature.  Patient normally gets around in a wheelchair.  This morning she fell when she tried to go from her wheelchair to the toilet.  She was incontinent of urine and feces which is unusual for her.  She was also confused and was talking out of her head.  No vomiting, abdominal pain, chest pain, shortness of breath or loss of consciousness.         Assessment/Plan:   Principal Problem:   Severe sepsis (HCC) Active Problems:   Acute diverticulitis   Chronic diastolic CHF (congestive heart failure) (HCC)   Diabetes mellitus without complication (HCC)   Acute metabolic encephalopathy   Hydroureteronephrosis   Hypokalemia    Body mass index is 20.94 kg/m.   Severe sepsis secondary to acute sigmoid and distal colonic diverticulitis: Continue IV Zosyn. Trial of clear liquid diet.  Discontinue IV fluids.  Analgesics as needed for pain.  Lactic acid improved from 3.1-2.5.   Acute metabolic encephalopathy: Improved   Distended bladder,  bilateral moderate hydroureteronephrosis:  Consulted Dr. Diamantina Providence, urologist, to assist with management.  He recommended that patient be discharged with Foley catheter with outpatient follow-up in 1 week.   Hypokalemia: Continue potassium repletion and monitor levels.   Hypomagnesemia: Improved   Prolonged QTc interval 511: Repeat EKG when potassium is replete  Anemia of chronic disease: difficult to exclude iron deficiency anemia at this point because of acute infection.  Vitamin B12 level was normal (827).   Latest Reference Range & Units Most Recent  Iron 28 - 170 ug/dL 32 08/23/22 04:59  UIBC ug/dL 188 08/23/22 04:59  TIBC 250 - 450 ug/dL 220 (L) 08/23/22 04:59  Saturation Ratios 10.4 - 31.8 % 15 08/23/22 04:59  Ferritin 11 - 307 ng/mL 85 08/23/22 04:59  (L): Data is abnormally low   Chronic diastolic CHF: Compensated.  Hold Lasix   Type II DM: Hold metformin and Januvia.  Generalized weakness: PT evaluation  Stage I decubitus ulcer on bilateral buttocks: Turn patient every 2 hours.  Apply Mepilex border erythematous area on bilateral buttocks   Recent lower extremity cellulitis: Hold Zyvox and Bactrim.   Diet Order             Diet clear liquid Room service appropriate? Yes; Fluid consistency: Thin  Diet effective now                            Consultants: Urologist  Procedures: None    Medications:    allopurinol  300 mg Oral Daily  atenolol  50 mg Oral Daily   cyanocobalamin  1,000 mcg Oral Daily   enoxaparin (LOVENOX) injection  40 mg Subcutaneous Q24H   levothyroxine  88 mcg Oral Q0600   potassium chloride  40 mEq Oral Q4H   Continuous Infusions:  piperacillin-tazobactam (ZOSYN)  IV 3.375 g (08/23/22 0636)     Anti-infectives (From admission, onward)    Start     Dose/Rate Route Frequency Ordered Stop   08/22/22 1445  piperacillin-tazobactam (ZOSYN) IVPB 3.375 g        3.375 g 12.5 mL/hr over 240 Minutes Intravenous Every 8  hours 08/22/22 1438     08/22/22 0730  ceFEPIme (MAXIPIME) 2 g in sodium chloride 0.9 % 100 mL IVPB        2 g 200 mL/hr over 30 Minutes Intravenous  Once 08/22/22 0726 08/22/22 0906              Family Communication/Anticipated D/C date and plan/Code Status   DVT prophylaxis: enoxaparin (LOVENOX) injection 40 mg Start: 08/23/22 0800     Code Status: Full Code  Family Communication: None Disposition Plan: Plan to discharge home in 2 to 3 days   Status is: Inpatient Remains inpatient appropriate because: IV antibiotics       Subjective:   Interval events noted.  She complains of weakness and fatigue.   No abdominal pain, vomiting, diarrhea  Objective:    Vitals:   08/23/22 0700 08/23/22 0800 08/23/22 0907 08/23/22 0912  BP: 128/80 112/60 137/89   Pulse: 76 68 83   Resp: 18 14    Temp:    97.7 F (36.5 C)  TempSrc:    Oral  SpO2: 100% 100%    Weight:      Height:       No data found.   Intake/Output Summary (Last 24 hours) at 08/23/2022 0921 Last data filed at 08/22/2022 2347 Gross per 24 hour  Intake 1000 ml  Output 1500 ml  Net -500 ml   Filed Weights   08/22/22 0811  Weight: 55.3 kg    Exam:  GEN: NAD SKIN: Warm and dry, erythema bilateral buttocks (present on admission).  EYES: EOMI ENT: MMM CV: RRR PULM: CTA B ABD: soft, ND, NT, +BS CNS: AAO x 3, non focal EXT: No edema or tenderness        Data Reviewed:   I have personally reviewed following labs and imaging studies:  Labs: Labs show the following:   Basic Metabolic Panel: Recent Labs  Lab 08/16/22 1347 08/21/22 1247 08/22/22 0740 08/22/22 0838 08/23/22 0459  NA 139 140 140  --  140  K 4.3 4.0 3.4*  --  3.4*  CL 107 107 112*  --  109  CO2 23 19* 18*  --  23  GLUCOSE 95 99 134*  --  95  BUN '14 13 11  '$ --  10  CREATININE 0.87 0.95 0.99  --  0.83  CALCIUM 9.2 9.6 8.0*  --  7.9*  MG  --   --   --  1.4* 2.1   GFR Estimated Creatinine Clearance: 47.5 mL/min  (by C-G formula based on SCr of 0.83 mg/dL). Liver Function Tests: Recent Labs  Lab 08/16/22 1347 08/22/22 0740  AST 18 31  ALT 9 11  ALKPHOS 54 47  BILITOT 0.4 0.7  PROT 7.3 6.7  ALBUMIN 3.6 3.1*   Recent Labs  Lab 08/22/22 0740  LIPASE 21   No results for input(s): "AMMONIA" in the  last 168 hours. Coagulation profile Recent Labs  Lab 08/22/22 0740  INR 1.3*    CBC: Recent Labs  Lab 08/16/22 1347 08/17/22 0433 08/21/22 1247 08/22/22 0740 08/23/22 0459  WBC 9.6 7.9 11.0* 15.6* 9.1  NEUTROABS 6.4  --  9.4* 14.1*  --   HGB 9.7* 8.6* 9.6* 9.2* 7.3*  HCT 32.7* 28.8* 32.3* 31.7* 24.6*  MCV 87.2 86.0 87.3 88.8 87.9  PLT 367 302 390 375 290   Cardiac Enzymes: No results for input(s): "CKTOTAL", "CKMB", "CKMBINDEX", "TROPONINI" in the last 168 hours. BNP (last 3 results) No results for input(s): "PROBNP" in the last 8760 hours. CBG: Recent Labs  Lab 08/17/22 1219 08/17/22 1729 08/17/22 2254 08/18/22 0929 08/18/22 1254  GLUCAP 122* 149* 110* 219* 119*   D-Dimer: No results for input(s): "DDIMER" in the last 72 hours. Hgb A1c: No results for input(s): "HGBA1C" in the last 72 hours. Lipid Profile: No results for input(s): "CHOL", "HDL", "LDLCALC", "TRIG", "CHOLHDL", "LDLDIRECT" in the last 72 hours. Thyroid function studies: No results for input(s): "TSH", "T4TOTAL", "T3FREE", "THYROIDAB" in the last 72 hours.  Invalid input(s): "FREET3" Anemia work up: No results for input(s): "VITAMINB12", "FOLATE", "FERRITIN", "TIBC", "IRON", "RETICCTPCT" in the last 72 hours. Sepsis Labs: Recent Labs  Lab 08/16/22 1347 08/17/22 0433 08/21/22 1247 08/22/22 0740 08/22/22 1201 08/23/22 0459  WBC 9.6 7.9 11.0* 15.6*  --  9.1  LATICACIDVEN 1.8  --   --  3.1* 2.5*  --     Microbiology Recent Results (from the past 240 hour(s))  Culture, blood (Routine X 2) w Reflex to ID Panel     Status: None   Collection Time: 08/16/22  1:47 PM   Specimen: BLOOD  Result Value  Ref Range Status   Specimen Description BLOOD LEFT ASSIST CONTROL  Final   Special Requests   Final    BOTTLES DRAWN AEROBIC AND ANAEROBIC Blood Culture results may not be optimal due to an excessive volume of blood received in culture bottles   Culture   Final    NO GROWTH 5 DAYS Performed at [facility], [address] [place], [place] [address]    Report Status 08/21/2022 FINAL  Final  Culture, blood (Routine X 2) w Reflex to ID Panel     Status: None   Collection Time: 08/16/22  7:32 PM   Specimen: BLOOD  Result Value Ref Range Status   Specimen Description BLOOD RIGHT ANTECUBITAL  Final   Special Requests   Final    BOTTLES DRAWN AEROBIC AND ANAEROBIC Blood Culture adequate volume   Culture   Final    NO GROWTH 5 DAYS Performed at [facility], [address] [place], [place] [address]    Report Status 08/21/2022 FINAL  Final  MRSA Next Gen by PCR, Nasal     Status: Abnormal   Collection Time: 08/17/22  2:16 PM   Specimen: Nasal Mucosa; Nasal Swab  Result Value Ref Range Status   MRSA by PCR Next Gen DETECTED (A) NOT DETECTED Final    Comment: RESULT CALLED TO, READ BACK BY AND VERIFIED WITH: LACLEFHA MEADWF AT 1545 ON 08/17/2022 BY SS (NOTE) The GeneXpert MRSA Assay (FDA approved for NASAL specimens only), is one component of a comprehensive MRSA colonization surveillance program. It is not intended to diagnose MRSA infection nor to guide or monitor treatment for MRSA infections. Test performance is not FDA approved in patients less than 50 years old. Performed at [facility], [address] [place], [place] [address]   Resp Panel by RT-PCR (Flu A&B, Covid) Anterior Nasal Swab     Status: None   Collection Time: 08/22/22  8:00 AM   Specimen: Anterior Nasal Swab  Result Value Ref Range Status   SARS Coronavirus 2 by RT PCR NEGATIVE NEGATIVE Final    Comment: (NOTE) SARS-CoV-2 target nucleic acids are NOT DETECTED.  The SARS-CoV-2 RNA  is generally detectable in upper respiratory specimens during the acute phase of infection. The lowest concentration of SARS-CoV-2 viral copies this assay can detect is 138 copies/mL. A negative result does not preclude SARS-Cov-2 infection and should not be used as the sole basis for treatment or other patient management decisions. A negative result may occur with  improper specimen collection/handling, submission of specimen other than nasopharyngeal swab, presence of viral mutation(s) within the areas targeted by this assay, and inadequate number of viral copies(<138 copies/mL). A negative result must be combined with clinical observations, patient history, and epidemiological information. The expected result is Negative.  Fact Sheet for Patients:  EntrepreneurPulse.com.au  Fact Sheet for Healthcare Providers:  IncredibleEmployment.be  This test is no t yet approved or cleared by the [place] FDA and  has been authorized for detection and/or diagnosis of SARS-CoV-2 by FDA under an Emergency Use Authorization (EUA). This EUA will remain  in effect (meaning this test can be used) for the duration of the COVID-19 declaration under Section 564(b)(1) of the Act, 21 U.S.C.section 360bbb-3(b)(1), unless the authorization is terminated  or revoked sooner.       Influenza A by PCR NEGATIVE NEGATIVE Final   Influenza B by PCR NEGATIVE NEGATIVE Final    Comment: (NOTE) The Xpert Xpress SARS-CoV-2/FLU/RSV plus assay is intended as an aid in the diagnosis of influenza from Nasopharyngeal swab specimens and should not be used as a sole basis for treatment. Nasal washings and aspirates are unacceptable for Xpert Xpress SARS-CoV-2/FLU/RSV testing.  Fact Sheet for Patients: EntrepreneurPulse.com.au  Fact Sheet for Healthcare Providers: IncredibleEmployment.be  This test is not yet approved or cleared by the  [place] FDA and has been authorized for detection and/or diagnosis of SARS-CoV-2 by FDA under an Emergency Use Authorization (EUA). This EUA will remain in effect (meaning this test can be used) for the duration of the COVID-19 declaration under Section 564(b)(1) of the Act, 21 U.S.C. section 360bbb-3(b)(1), unless the authorization is terminated or revoked.  Performed at [facility], [address], [place], [place] [address]   Urine Culture     Status: None   Collection Time: 08/22/22  8:53 AM   Specimen: In/Out Cath Urine  Result Value Ref Range Status   Specimen Description   Final    IN/OUT CATH URINE Performed at [facility], [address], [place], [place] [address]    Special Requests   Final    NONE Performed at [facility], [address] [place], [place] [address]    Culture   Final    NO GROWTH Performed at [facility], [address], [place], [place] [address]    Report Status 08/23/2022 FINAL  Final  C Difficile Quick Screen w PCR reflex     Status: None   Collection Time: 08/22/22  8:53 AM   Specimen: STOOL  Result Value Ref Range Status   C Diff antigen NEGATIVE NEGATIVE Final   C Diff toxin NEGATIVE NEGATIVE Final   C Diff interpretation No C. difficile detected.  Final  Comment: Performed at [facility], [address] [place], [place] [address]    Procedures and diagnostic studies:  CT ABDOMEN PELVIS W CONTRAST  Result Date: 08/22/2022 CLINICAL DATA:  Abdominal pain, acute, nonlocalized. Sepsis. Bilateral lower extremity cellulitis. Incontinent of bowel and bladder. Hyperglycemia. EXAM: CT ABDOMEN AND PELVIS WITH CONTRAST TECHNIQUE: Multidetector CT imaging of the abdomen and pelvis was performed using the standard protocol following bolus administration of intravenous contrast. RADIATION DOSE REDUCTION: This exam was performed according to the departmental dose-optimization program which  includes automated exposure control, adjustment of the mA and/or kV according to patient size and/or use of iterative reconstruction technique. CONTRAST:  131m OMNIPAQUE IOHEXOL 300 MG/ML  SOLN COMPARISON:  AP chest 08/22/2022, CT abdomen pelvis without contrast 09/26/2018 FINDINGS: Lower chest: Mild ground-glass likely subsegmental atelectasis within the bilateral posterior lung bases. Partial visualization of postsurgical changes of prior mastectomy again noted. Heart size is again mildly to moderately enlarged. Moderate mitral annular calcifications. Trace pericardial fluid is similar to prior. Hepatobiliary: Smooth liver contours. Status post cholecystectomy. Minimal central intrahepatic biliary ductal dilatation, presumably postsurgical. Pancreas: Large pancreatic parenchymal atrophy, similar to prior. No pancreatic mass or inflammatory change is seen. No pancreatic ductal dilatation. Spleen: Normal in size without focal abnormality. Adrenals/Urinary Tract: Normal bilateral adrenals. The urinary bladder is moderately to markedly distended. No focal urinary bladder wall thickening. Mild edema/fat stranding peripheral to the anterior left aspect of the urinary bladder (axial series 2, image 65, coronal series 5, image 18). There is moderate distention of bilateral ureters diffusely, down to the bilateral ureterovesicular junctions. New moderate bilateral hydronephrosis. Redemonstration of normal variant bilateral extrarenal pelves. There are again moderate left and mild right areas of renal cortical atrophy and scarring. New mild bilateral perinephric fat stranding. Stomach/Bowel: There is diffuse moderate thickening of the sigmoid and distal descending colon walls. Mild-to-moderate scattered diverticula in these regions. Findings are suspicious for acute diverticulitis. No bowel perforation or abscess is seen. The terminal ileum is unremarkable. A small blind-ending tubular structure favored to represent the  normal appendix is seen on axial series 2 images 49 through 52. No dilated loops of bowel to indicate bowel obstruction. Vascular/Lymphatic: The abdominal aortic aneurysm. The major intra-abdominal aortic branch vessels are patent. Mild-to-moderate atherosclerotic calcifications. No mesenteric, retroperitoneal, or pelvic pathologically enlarged lymph nodes by CT criteria. Reproductive: The uterus is surgically absent. No gross adnexal abnormality is seen. Other: Unchanged moderate fat containing umbilical hernia measuring up to 2.4 cm in transverse dimension. Trace free fluid within the pelvis. No pneumoperitoneum. Musculoskeletal: Mild levocurvature of the thoracolumbar junction mild dextrocurvature centered at L4. Mild retrolisthesis of L1 on L2 and L2 on L3. Severe L1-2 and L2-3 disc space narrowing. Grade 1 anterolisthesis of L4 on L5. Severe multilevel facet joint degenerative changes. IMPRESSION: 1. Moderate to markedly distended urinary bladder with new moderate bilateral hydroureteronephrosis. This may be secondary to partial urinary bladder outlet obstruction. There is mild edema/fat stranding peripheral to the anterior left aspect of the urinary bladder, possible fairly cystitis. Recommend correlation with urinalysis. 2. Mild-to-moderate sigmoid diverticulosis with inflammatory changes suggesting diffuse sigmoid and distal descending colon acute diverticulitis. No bowel perforation or drainable fluid collection. 3. Additional chronic findings as above. Aortic Atherosclerosis (ICD10-I70.0). Electronically Signed   By: RYvonne KendallM.D.   On: 08/22/2022 10:40   DG Chest Port 1 View  Result Date: 08/22/2022 CLINICAL DATA:  81year old female with possible sepsis. Altered mental status and lower extremity cellulitis. EXAM: PORTABLE CHEST 1 VIEW  COMPARISON:  Portable chest 05/26/2012. lumbar spine CT yesterday. FINDINGS: Portable AP upright view at 0735 hours. Low lung volumes. But left lung base  ventilation appears improved since 2013 and Allowing for portable technique the lungs are clear. Mediastinal contours are within normal limits. Visualized tracheal air column is within normal limits. No pneumothorax or pleural effusion identified. No acute osseous abnormality identified. Negative visible bowel gas. IMPRESSION: Low lung volumes, otherwise no acute cardiopulmonary abnormality. Electronically Signed   By: Genevie Ann M.D.   On: 08/22/2022 07:46   CT Lumbar Spine Wo Contrast  Result Date: 08/21/2022 CLINICAL DATA:  Increased back pain. EXAM: CT LUMBAR SPINE WITHOUT CONTRAST TECHNIQUE: Multidetector CT imaging of the lumbar spine was performed without intravenous contrast administration. Multiplanar CT image reconstructions were also generated. RADIATION DOSE REDUCTION: This exam was performed according to the departmental dose-optimization program which includes automated exposure control, adjustment of the mA and/or kV according to patient size and/or use of iterative reconstruction technique. COMPARISON:  04/18/09 MRI L Spine FINDINGS: Segmentation: 5 lumbar type vertebrae. Alignment: Retrolisthesis of L2 L2-L3.  Grade 1 L4 on L5. Vertebrae: There are multilevel degenerative endplate changes. No evidence of fracture. Paraspinal and other soft tissues: Extrarenal pelvis bilaterally. There is fatty atrophy of the paraspinal musculature. Disc levels: T11-T12: No evidence of spinal canal or neural foraminal stenosis. T12-L1: Small disc bulge. No definite evidence of spinal or neural foraminal stenosis. L1-L2: Moderate spinal canal stenosis. Moderate bilateral neural foraminal stenosis. Severe bilateral facet degenerative change. L2-L3: Severe spinal canal stenosis. Severe bilateral neural foraminal stenosis. Severe bilateral facet degenerative change. L3-L4: Severe spinal canal stenosis. Mild-to-moderate bilateral neural foraminal stenosis. Severe bilateral facet degenerative change. L4-L5: Severe spinal  canal stenosis. Mild bilateral neural foraminal stenosis. Severe bilateral facet degenerative change. L5-S1: No significant spinal canal stenosis. Mild bilateral neural foraminal narrowing. Severe bilateral facet degenerative change. IMPRESSION: 1. Severe spinal canal stenosis at L2-L3, L3-L4, and L4-L5. These findings have progressed from 2010. Consider further evaluation with a lumbar spine MRI, as clinically indicated. 2. Severe bilateral neural foraminal stenosis at L2-L3 and L3-L4. 3. No acute fracture. Electronically Signed   By: Marin Roberts M.D.   On: 08/21/2022 14:30               LOS: 1 day   Prem Coykendall  [facility]   Pager on [email or web address]. If 7PM-7AM, please contact night-coverage at [email or web address]     08/23/2022, 9:21 AM

## 2022-08-23 NOTE — Evaluation (Addendum)
Physical Therapy Evaluation Patient Details Name: [name] MRN: [id number] DOB: [birth year] Today's Date: 08/23/2022  History of Present Illness  [name] is a 18yoF here for sepsis, fall prior to admission while trying to perform toiletting.  Clinical Impression  Pt admitted with above Dx. Pt has functional limitations due to deficits below (see "PT Problem List"). PLOF known from last admission. Today pt requires mild physical assistance to perform bed mobility and transfers, strength improved but remains short of baseline. Patient's performance this date reveals decreased ability, independence, and tolerance in performing all basic mobility required for performance of activities of daily living. Pt requires additional DME, close physical assistance, and cues for safe participate in mobility. Pt will benefit from skilled PT intervention to increase independence and safety with basic mobility in preparation for discharge to the venue listed below.         Recommendations for follow up therapy are one component of a multi-disciplinary discharge planning process, led by the attending physician.  Recommendations may be updated based on patient status, additional functional criteria and insurance authorization.  Follow Up Recommendations Home health PT      Assistance Recommended at Discharge Intermittent Supervision/Assistance  Patient can return home with the following  Assist for transportation;Assistance with cooking/housework    Equipment Recommendations None recommended by PT  Recommendations for Other Services       Functional Status Assessment Patient has not had a recent decline in their functional status     Precautions / Restrictions Restrictions Weight Bearing Restrictions: No      Mobility  Bed Mobility                    Transfers Overall transfer level: Needs assistance Equipment used: None Transfers: Bed to chair/wheelchair/BSC Sit to  Stand: Min guard Stand pivot transfers: Min guard         General transfer comment: increased time/effort to perform, weak, not at baseline    Ambulation/Gait Ambulation/Gait assistance:  (pt does not AMB at baseline, uses transport chair in home; BLE propulsion)                Stairs            Wheelchair Mobility    Modified Rankin (Stroke Patients Only)       Balance                                             Pertinent Vitals/Pain      Home Living Family/patient expects to be discharged to:: Private residence Living Arrangements: Spouse/significant other Available Help at Discharge: Family;Available 24 hours/day Type of Home: House Home Access: Level entry       Home Layout: One level Home Equipment: Transport chair;Shower seat - built in      Prior Function Prior Level of Function : Needs assist             Mobility Comments: sits in transport chair for mobility, PRN assist from spouse for transfers in/out of her chair, L knee arthritis/pain limiting mobility ADLs Comments: Takes seated sponge bath, generally able to get dressed and toilet herself. Spouse assists with IADL, Pt denies falls in past 61mo    Hand Dominance        Extremity/Trunk Assessment  Communication   Communication: [facility]  Cognition                                                General Comments      Exercises     Assessment/Plan    PT Assessment Patient needs continued PT services  PT Problem List Decreased mobility;Decreased strength       PT Treatment Interventions DME instruction;Functional mobility training;Therapeutic activities;Therapeutic exercise    PT Goals (Current goals can be found in the Care Plan section)  Acute Rehab PT Goals Patient Stated Goal: feel better, go home PT Goal Formulation: All assessment and education complete, DC therapy Time For Goal Achievement:  09/06/22 Potential to Achieve Goals: Good    Frequency Min 2X/week     Co-evaluation               AM-PAC PT "6 Clicks" Mobility  Outcome Measure Help needed turning from your back to your side while in a flat bed without using bedrails?: A Little Help needed moving from lying on your back to sitting on the side of a flat bed without using bedrails?: A Little Help needed moving to and from a bed to a chair (including a wheelchair)?: A Little Help needed standing up from a chair using your arms (e.g., wheelchair or bedside chair)?: A Little Help needed to walk in hospital room?: Total Help needed climbing 3-5 steps with a railing? : Total 6 Click Score: 14    End of Session   Activity Tolerance: Patient tolerated treatment well Patient left: in bed;with nursing/sitter in room;with call bell/phone within reach Nurse Communication: Mobility status PT Visit Diagnosis: Other abnormalities of gait and mobility (R26.89);Muscle weakness (generalized) (M62.81);History of falling (Z91.81)    Time: 8466-5993 PT Time Calculation (min) (ACUTE ONLY): 8 min   Charges:   PT Evaluation $PT Eval Low Complexity: 1 Low         5:24 PM, 08/23/22 Etta Grandchild, PT, DPT Physical Therapist - [facility]  [phone] ([place])    [name] C 08/23/2022, 5:24 PM

## 2022-08-24 ENCOUNTER — Inpatient Hospital Stay: Payer: PPO

## 2022-08-24 DIAGNOSIS — I5032 Chronic diastolic (congestive) heart failure: Secondary | ICD-10-CM | POA: Diagnosis not present

## 2022-08-24 DIAGNOSIS — E119 Type 2 diabetes mellitus without complications: Secondary | ICD-10-CM | POA: Diagnosis not present

## 2022-08-24 DIAGNOSIS — A419 Sepsis, unspecified organism: Secondary | ICD-10-CM | POA: Diagnosis not present

## 2022-08-24 DIAGNOSIS — K5792 Diverticulitis of intestine, part unspecified, without perforation or abscess without bleeding: Secondary | ICD-10-CM | POA: Diagnosis not present

## 2022-08-24 LAB — CBC
HCT: 26.1 % — ABNORMAL LOW (ref 36.0–46.0)
Hemoglobin: 7.9 g/dL — ABNORMAL LOW (ref 12.0–15.0)
MCH: 26.1 pg (ref 26.0–34.0)
MCHC: 30.3 g/dL (ref 30.0–36.0)
MCV: 86.1 fL (ref 80.0–100.0)
Platelets: 314 10*3/uL (ref 150–400)
RBC: 3.03 MIL/uL — ABNORMAL LOW (ref 3.87–5.11)
RDW: 19.9 % — ABNORMAL HIGH (ref 11.5–15.5)
WBC: 8.4 10*3/uL (ref 4.0–10.5)
nRBC: 0 % (ref 0.0–0.2)

## 2022-08-24 LAB — GLUCOSE, CAPILLARY
Glucose-Capillary: 101 mg/dL — ABNORMAL HIGH (ref 70–99)
Glucose-Capillary: 106 mg/dL — ABNORMAL HIGH (ref 70–99)

## 2022-08-24 LAB — POTASSIUM: Potassium: 4.3 mmol/L (ref 3.5–5.1)

## 2022-08-24 LAB — MAGNESIUM: Magnesium: 1.8 mg/dL (ref 1.7–2.4)

## 2022-08-24 MED ORDER — CHLORHEXIDINE GLUCONATE CLOTH 2 % EX PADS
6.0000 | MEDICATED_PAD | Freq: Every day | CUTANEOUS | Status: DC
  Administered 2022-08-24 – 2022-08-25 (×2): 6 via TOPICAL

## 2022-08-24 MED ORDER — AMOXICILLIN-POT CLAVULANATE 875-125 MG PO TABS
1.0000 | ORAL_TABLET | Freq: Three times a day (TID) | ORAL | Status: DC
  Administered 2022-08-24 – 2022-08-25 (×4): 1 via ORAL
  Filled 2022-08-24 (×4): qty 1

## 2022-08-24 MED ORDER — LOPERAMIDE HCL 2 MG PO CAPS
2.0000 mg | ORAL_CAPSULE | Freq: Four times a day (QID) | ORAL | Status: DC | PRN
  Administered 2022-08-24: 2 mg via ORAL
  Filled 2022-08-24: qty 1

## 2022-08-24 MED ORDER — KCL-LACTATED RINGERS 20 MEQ/L IV SOLN
INTRAVENOUS | Status: DC
  Filled 2022-08-24: qty 1000

## 2022-08-24 MED ORDER — ADULT MULTIVITAMIN W/MINERALS CH
1.0000 | ORAL_TABLET | Freq: Every day | ORAL | Status: DC
  Administered 2022-08-24 – 2022-08-25 (×2): 1 via ORAL
  Filled 2022-08-24 (×2): qty 1

## 2022-08-24 MED ORDER — POTASSIUM CHLORIDE 2 MEQ/ML IV SOLN
INTRAVENOUS | Status: DC
  Filled 2022-08-24 (×2): qty 1000

## 2022-08-24 MED ORDER — BOOST / RESOURCE BREEZE PO LIQD CUSTOM
1.0000 | Freq: Three times a day (TID) | ORAL | Status: DC
  Administered 2022-08-24 – 2022-08-25 (×4): 1 via ORAL

## 2022-08-24 NOTE — Progress Notes (Addendum)
Progress Note    [name]  SNK:[id number] DOB: 02/10/1941  DOA: 08/22/2022 PCP: Tracie Harrier, MD      Brief Narrative:    Medical records reviewed and are as summarized below:  [name] is a 81 y.o. female with medical history significant for recent discharge from the hospital on 05/18/2022 after hospitalization for cellulitis of the lower extremities (treated with IV ceftriaxone), hypertension, hyperlipidemia, diabetes mellitus, hypothyroidism, gout, breast cancer s/p radiation and mastectomy, PVD, iron deficiency anemia, chronic diastolic CHF. She was seen in the emergency room on 08/21/2022 for back pain and was discharged home.  She presented to the emergency department again on 08/22/2022 because of change in mental status.  Roderic Palau, her son, was at the bedside and he provided most of the day history.  He said that patient felt very warm to touch and was shivering this morning.  He did not check her temperature.  Patient normally gets around in a wheelchair.  This morning she fell when she tried to go from her wheelchair to the toilet.  She was incontinent of urine and feces which is unusual for her.  She was also confused and was talking out of her head.  No vomiting, abdominal pain, chest pain, shortness of breath or loss of consciousness.         Assessment/Plan:   Principal Problem:   Severe sepsis (HCC) Active Problems:   Acute diverticulitis   Chronic diastolic CHF (congestive heart failure) (HCC)   Diabetes mellitus without complication (HCC)   Acute metabolic encephalopathy   Hydroureteronephrosis   Hypokalemia   Pressure injury of skin    Body mass index is 20.94 kg/m.   Severe sepsis secondary to acute sigmoid and distal colonic diverticulitis: Advance diet to heart healthy diet.  Discontinue IV Zosyn and start Augmentin. Lactic acid improved from 3.1-2.5.  Diarrhea: Suspect this may be due to IV Zosyn.  IV Zosyn has been  discontinued.  Use Imodium as needed for diarrhea.  Rehydrate with IV fluids and monitor electrolytes.   Acute metabolic encephalopathy: Improved   Distended bladder, bilateral moderate hydroureteronephrosis:  Consulted Dr. Diamantina Providence, urologist, on 08/23/2022.  He recommended that patient be discharged with Foley catheter with outpatient follow-up in 1 week (discussed via secure chat)..   Hypokalemia and hypomagnesemia: Improved   Acute on chronic back pain, recent fall at home, severe spinal canal stenosis at L2-L3, L3-L4 and L4-L5.  No acute fracture noted on CT lumbar spine done on 08/21/2022.  Jonathan,son, is concerned about recent complaint of back pain.  MRI lumbar spine will be obtained for further evaluation.   Prolonged QTc interval 511: Repeat EKG today.  Anemia of chronic disease: difficult to exclude iron deficiency anemia at this point because of acute infection.  Vitamin B12 level was normal (827).   Latest Reference Range & Units Most Recent  Iron 28 - 170 ug/dL 32 08/23/22 04:59  UIBC ug/dL 188 08/23/22 04:59  TIBC 250 - 450 ug/dL 220 (L) 08/23/22 04:59  Saturation Ratios 10.4 - 31.8 % 15 08/23/22 04:59  Ferritin 11 - 307 ng/mL 85 08/23/22 04:59  (L): Data is abnormally low   Chronic diastolic CHF: Compensated.  Hold Lasix   Type II DM: Hold metformin and Januvia.  Generalized weakness: PT recommended home health  Stage I decubitus ulcer on bilateral buttocks : Turn patient every 2 hours.  Apply Mepilex border erythematous area on bilateral buttocks   Recent lower extremity cellulitis: Hold Zyvox  and Bactrim.   Diet Order             DIET DYS 3 Room service appropriate? Yes; Fluid consistency: Thin  Diet effective now                            Consultants: Urologist  Procedures: None    Medications:    allopurinol  300 mg Oral Daily   amoxicillin-clavulanate  1 tablet Oral Q8H   atenolol  50 mg Oral Daily   Chlorhexidine  Gluconate Cloth  6 each Topical Daily   cyanocobalamin  1,000 mcg Oral Daily   enoxaparin (LOVENOX) injection  40 mg Subcutaneous Q24H   feeding supplement  1 Container Oral TID BM   levothyroxine  88 mcg Oral Q0600   multivitamin with minerals  1 tablet Oral Daily   Continuous Infusions:  lactated ringers with KCl 20 mEq/L        Anti-infectives (From admission, onward)    Start     Dose/Rate Route Frequency Ordered Stop   08/24/22 1400  amoxicillin-clavulanate (AUGMENTIN) 875-125 MG per tablet 1 tablet        1 tablet Oral Every 8 hours 08/24/22 1240 09/01/22 1359   08/22/22 1445  piperacillin-tazobactam (ZOSYN) IVPB 3.375 g  Status:  Discontinued        3.375 g 12.5 mL/hr over 240 Minutes Intravenous Every 8 hours 08/22/22 1438 08/24/22 1238   08/22/22 0730  ceFEPIme (MAXIPIME) 2 g in sodium chloride 0.9 % 100 mL IVPB        2 g 200 mL/hr over 30 Minutes Intravenous  Once 08/22/22 0726 08/22/22 2836              Family Communication/Anticipated D/C date and plan/Code Status   DVT prophylaxis: enoxaparin (LOVENOX) injection 40 mg Start: 08/23/22 0800     Code Status: Full Code  Family Communication: Roderic Palau, son, over the phone Disposition Plan: Plan to discharge tomorrow   Status is: Inpatient Remains inpatient appropriate because: Monitor diarrhea overnight       Subjective:   Interval events noted.  She complains of diarrhea (about 3 times overnight).  She also complains of back pain which has been bothering her.  No abdominal pain or vomiting.  Objective:    Vitals:   08/23/22 1200 08/23/22 1356 08/24/22 0027 08/24/22 0907  BP: 122/80 (!) 142/78 103/65 (!) 140/79  Pulse: 70 72 69 76  Resp: '18 16 16 18  '$ Temp: 98.4 F (36.9 C) 97.8 F (36.6 C) 98.9 F (37.2 C) 98.2 F (36.8 C)  TempSrc: Oral     SpO2: 98% 100% 98% 98%  Weight:      Height:       No data found.   Intake/Output Summary (Last 24 hours) at 08/24/2022 1244 Last data  filed at 08/24/2022 0816 Gross per 24 hour  Intake 195.41 ml  Output 1650 ml  Net -1454.59 ml   Filed Weights   08/22/22 0811  Weight: 55.3 kg    Exam:  GEN: NAD SKIN: Warm and dry EYES: No pallor or icterus ENT: MMM CV: RRR PULM: CTA B ABD: soft, ND, NT, +BS CNS: AAO x 3, non focal EXT: No edema or tenderness MSK: No spinal tenderness     Pressure Injury 08/24/22 Sacrum Mid Stage 1 -  Intact skin with non-blanchable redness of a localized area usually over a bony prominence. (Active)  08/24/22 1231  Location: Sacrum  Location Orientation: Mid  Staging: Stage 1 -  Intact skin with non-blanchable redness of a localized area usually over a bony prominence.  Wound Description (Comments):   Present on Admission: Yes     Data Reviewed:   I have personally reviewed following labs and imaging studies:  Labs: Labs show the following:   Basic Metabolic Panel: Recent Labs  Lab 08/21/22 1247 08/22/22 0740 08/22/22 0838 08/23/22 0459 08/24/22 0413  NA 140 140  --  140  --   K 4.0 3.4*  --  3.4* 4.3  CL 107 112*  --  109  --   CO2 19* 18*  --  23  --   GLUCOSE 99 134*  --  95  --   BUN 13 11  --  10  --   CREATININE 0.95 0.99  --  0.83  --   CALCIUM 9.6 8.0*  --  7.9*  --   MG  --   --  1.4* 2.1 1.8   GFR Estimated Creatinine Clearance: 47.5 mL/min (by C-G formula based on SCr of 0.83 mg/dL). Liver Function Tests: Recent Labs  Lab 08/22/22 0740  AST 31  ALT 11  ALKPHOS 47  BILITOT 0.7  PROT 6.7  ALBUMIN 3.1*   Recent Labs  Lab 08/22/22 0740  LIPASE 21   No results for input(s): "AMMONIA" in the last 168 hours. Coagulation profile Recent Labs  Lab 08/22/22 0740  INR 1.3*    CBC: Recent Labs  Lab 08/21/22 1247 08/22/22 0740 08/23/22 0459 08/24/22 0413  WBC 11.0* 15.6* 9.1 8.4  NEUTROABS 9.4* 14.1*  --   --   HGB 9.6* 9.2* 7.3* 7.9*  HCT 32.3* 31.7* 24.6* 26.1*  MCV 87.3 88.8 87.9 86.1  PLT 390 375 290 314   Cardiac Enzymes: No  results for input(s): "CKTOTAL", "CKMB", "CKMBINDEX", "TROPONINI" in the last 168 hours. BNP (last 3 results) No results for input(s): "PROBNP" in the last 8760 hours. CBG: Recent Labs  Lab 08/18/22 1254 08/23/22 1410 08/23/22 2049 08/24/22 0833 08/24/22 1159  GLUCAP 119* 76 92 101* 106*   D-Dimer: No results for input(s): "DDIMER" in the last 72 hours. Hgb A1c: No results for input(s): "HGBA1C" in the last 72 hours. Lipid Profile: No results for input(s): "CHOL", "HDL", "LDLCALC", "TRIG", "CHOLHDL", "LDLDIRECT" in the last 72 hours. Thyroid function studies: No results for input(s): "TSH", "T4TOTAL", "T3FREE", "THYROIDAB" in the last 72 hours.  Invalid input(s): "FREET3" Anemia work up: Recent Labs    08/22/22 0740 08/23/22 0459  VITAMINB12 827  --   FERRITIN  --  85  TIBC  --  220*  IRON  --  32   Sepsis Labs: Recent Labs  Lab 08/21/22 1247 08/22/22 0740 08/22/22 1201 08/23/22 0459 08/24/22 0413  WBC 11.0* 15.6*  --  9.1 8.4  LATICACIDVEN  --  3.1* 2.5*  --   --     Microbiology Recent Results (from the past 240 hour(s))  Culture, blood (Routine X 2) w Reflex to ID Panel     Status: None   Collection Time: 08/16/22  1:47 PM   Specimen: BLOOD  Result Value Ref Range Status   Specimen Description BLOOD LEFT ASSIST CONTROL  Final   Special Requests   Final    BOTTLES DRAWN AEROBIC AND ANAEROBIC Blood Culture results may not be optimal due to an excessive volume of blood received in culture bottles   Culture   Final    NO GROWTH 5 DAYS  Performed at [facility], [address] [place], [place] [address]    Report Status 08/21/2022 FINAL  Final  Culture, blood (Routine X 2) w Reflex to ID Panel     Status: None   Collection Time: 08/16/22  7:32 PM   Specimen: BLOOD  Result Value Ref Range Status   Specimen Description BLOOD RIGHT ANTECUBITAL  Final   Special Requests   Final    BOTTLES DRAWN AEROBIC AND ANAEROBIC Blood Culture adequate volume    Culture   Final    NO GROWTH 5 DAYS Performed at [facility], [address] [place], [place] [address]    Report Status 08/21/2022 FINAL  Final  MRSA Next Gen by PCR, Nasal     Status: Abnormal   Collection Time: 08/17/22  2:16 PM   Specimen: Nasal Mucosa; Nasal Swab  Result Value Ref Range Status   MRSA by PCR Next Gen DETECTED (A) NOT DETECTED Final    Comment: RESULT CALLED TO, READ BACK BY AND VERIFIED WITH: LACLEFHA MEADWF AT 1545 ON 08/17/2022 BY SS (NOTE) The GeneXpert MRSA Assay (FDA approved for NASAL specimens only), is one component of a comprehensive MRSA colonization surveillance program. It is not intended to diagnose MRSA infection nor to guide or monitor treatment for MRSA infections. Test performance is not FDA approved in patients less than 28 years old. Performed at [facility], [address] [place], [place] [address]   Blood Culture (routine x 2)     Status: None (Preliminary result)   Collection Time: 08/22/22  7:40 AM   Specimen: BLOOD  Result Value Ref Range Status   Specimen Description BLOOD BLOOD LEFT FOREARM  Final   Special Requests   Final    BOTTLES DRAWN AEROBIC AND ANAEROBIC Blood Culture results may not be optimal due to an inadequate volume of blood received in culture bottles   Culture   Final    NO GROWTH 2 DAYS Performed at [facility], [address] [place], [place] [address]    Report Status PENDING  Incomplete  Blood Culture (routine x 2)     Status: None (Preliminary result)   Collection Time: 08/22/22  7:40 AM   Specimen: BLOOD  Result Value Ref Range Status   Specimen Description BLOOD RIGHT ANTECUBITAL  Final   Special Requests   Final    BOTTLES DRAWN AEROBIC AND ANAEROBIC Blood Culture adequate volume   Culture   Final    NO GROWTH 2 DAYS Performed at [facility], [address], [place],  [address]    Report Status PENDING  Incomplete  Resp Panel by RT-PCR (Flu A&B,  Covid) Anterior Nasal Swab     Status: None   Collection Time: 08/22/22  8:00 AM   Specimen: Anterior Nasal Swab  Result Value Ref Range Status   SARS Coronavirus 2 by RT PCR NEGATIVE NEGATIVE Final    Comment: (NOTE) SARS-CoV-2 target nucleic acids are NOT DETECTED.  The SARS-CoV-2 RNA is generally detectable in upper respiratory specimens during the acute phase of infection. The lowest concentration of SARS-CoV-2 viral copies this assay can detect is 138 copies/mL. A negative result does not preclude SARS-Cov-2 infection and should not be used as the sole basis for treatment or other patient management decisions. A negative result may occur with  improper specimen collection/handling, submission of specimen other than nasopharyngeal swab, presence of viral mutation(s) within the areas targeted by this assay, and inadequate number of viral  copies(<138 copies/mL). A negative result must be combined with clinical observations, patient history, and epidemiological information. The expected result is Negative.  Fact Sheet for Patients:  EntrepreneurPulse.com.au  Fact Sheet for Healthcare Providers:  IncredibleEmployment.be  This test is no t yet approved or cleared by the [place] FDA and  has been authorized for detection and/or diagnosis of SARS-CoV-2 by FDA under an Emergency Use Authorization (EUA). This EUA will remain  in effect (meaning this test can be used) for the duration of the COVID-19 declaration under Section 564(b)(1) of the Act, 21 U.S.C.section 360bbb-3(b)(1), unless the authorization is terminated  or revoked sooner.       Influenza A by PCR NEGATIVE NEGATIVE Final   Influenza B by PCR NEGATIVE NEGATIVE Final    Comment: (NOTE) The Xpert Xpress SARS-CoV-2/FLU/RSV plus assay is intended as an aid in the diagnosis of influenza from Nasopharyngeal swab specimens and should not be used as a sole basis for treatment. Nasal  washings and aspirates are unacceptable for Xpert Xpress SARS-CoV-2/FLU/RSV testing.  Fact Sheet for Patients: EntrepreneurPulse.com.au  Fact Sheet for Healthcare Providers: IncredibleEmployment.be  This test is not yet approved or cleared by the [place] FDA and has been authorized for detection and/or diagnosis of SARS-CoV-2 by FDA under an Emergency Use Authorization (EUA). This EUA will remain in effect (meaning this test can be used) for the duration of the COVID-19 declaration under Section 564(b)(1) of the Act, 21 U.S.C. section 360bbb-3(b)(1), unless the authorization is terminated or revoked.  Performed at [facility], [address], [place], [place] [address]   Urine Culture     Status: None   Collection Time: 08/22/22  8:53 AM   Specimen: In/Out Cath Urine  Result Value Ref Range Status   Specimen Description   Final    IN/OUT CATH URINE Performed at [facility] [address] [place], [place] [address]    Special Requests   Final    NONE Performed at [facility], [address], [place], [place] [address]    Culture   Final    NO GROWTH Performed at [facility], [place] [address], [place], [place] [address]    Report Status 08/23/2022 FINAL  Final  C Difficile Quick Screen w PCR reflex     Status: None   Collection Time: 08/22/22  8:53 AM   Specimen: STOOL  Result Value Ref Range Status   C Diff antigen NEGATIVE NEGATIVE Final   C Diff toxin NEGATIVE NEGATIVE Final   C Diff interpretation No C. difficile detected.  Final    Comment: Performed at [facility], [address] [place], [place] [address]    Procedures and diagnostic studies:  No results found.             LOS: 2 days   Sammantha Mehlhaff  [facility]   Pager on [email or web address]. If 7PM-7AM, please contact night-coverage at [email or web address]     08/24/2022, 12:44 PM

## 2022-08-24 NOTE — Progress Notes (Addendum)
Initial Nutrition Assessment  DOCUMENTATION CODES:   Non-severe (moderate) malnutrition in context of chronic illness  INTERVENTION:   -Boost Breeze po TID, each supplement provides 250 kcal and 9 grams of protein  -MVI with minerals daily -Downgrade diet to dysphagia 3 diet for ease of intake -Obtain new wt  NUTRITION DIAGNOSIS:   Moderate Malnutrition related to chronic illness (CHF) as evidenced by mild fat depletion, moderate fat depletion, mild muscle depletion, moderate muscle depletion.  GOAL:   Patient will meet greater than or equal to 90% of their needs  MONITOR:   PO intake, Supplement acceptance, Diet advancement  REASON FOR ASSESSMENT:   Malnutrition Screening Tool    ASSESSMENT:   Pt with medical history significant for recent discharge from the hospital on 05/18/2022 after hospitalization for cellulitis of the lower extremities (treated with IV ceftriaxone), hypertension, hyperlipidemia, diabetes mellitus, hypothyroidism, gout, breast cancer s/p radiation and mastectomy, PVD, iron deficiency anemia, chronic diastolic CHF.  Pt admitted with severe sepsis secondary to acute sigmoid and distal colonic diverticulitis.   Reviewed I/O's: -805 ml x 24 hours and +895 ml since admission  UOP: 1 L x 24 hours   Spoke with pt at bedside, who was groggy at time of visit. She reports she is tolerating her clear liquid diet well, however, only consumed a few bites of jello. Pt reports good appetite PTA, consuming 2-3 meals per day. Pt shares she eats "regular food" but often foods need to be chopped secondary to ill fitting dentures. Noted diet was just advanced to heart healthy diet after RD visit.   Per pt, she admits to a 20 pound weight loss over the past few months, which she reports is related to "cutting out all of the junk". Reviewed wt hx; wt has been stable over the past 4 months, but suspect wt reading may be a stated wt vs measured wt. RD will obtain new wt to  better assess wt changes.   Discussed importance of good meal and supplement intake to promote healing.   Medications reviewed and include vitamin B-12.   Lab Results  Component Value Date   HGBA1C 5.7 (H) 08/16/2022   PTA DM medications are 1000 mg metformin BID and 50 mg [place] daily.   Labs reviewed: CBGS: 76-106 (inpatient orders for glycemic control are ).    NUTRITION - FOCUSED PHYSICAL EXAM:  Flowsheet Row Most Recent Value  Orbital Region Mild depletion  Upper Arm Region Mild depletion  Thoracic and Lumbar Region No depletion  Buccal Region Mild depletion  Temple Region Moderate depletion  Clavicle Bone Region Mild depletion  Clavicle and Acromion Bone Region Mild depletion  Scapular Bone Region Mild depletion  Dorsal Hand Mild depletion  Patellar Region Moderate depletion  Anterior Thigh Region Moderate depletion  Posterior Calf Region Moderate depletion  Edema (RD Assessment) Mild  Hair Reviewed  Eyes Reviewed  Mouth Reviewed  Skin Reviewed  Nails Reviewed       Diet Order:   Diet Order             DIET DYS 3 Room service appropriate? Yes; Fluid consistency: Thin  Diet effective now                   EDUCATION NEEDS:   Education needs have been addressed  Skin:  Skin Assessment: Skin Integrity Issues: Skin Integrity Issues:: Stage I Stage I: sacrum  Last BM:  08/24/22  Height:   Ht Readings from Last 1 Encounters:  08/22/22  $'5\' 4"'O$  (1.626 m)    Weight:   Wt Readings from Last 1 Encounters:  08/22/22 55.3 kg    Ideal Body Weight:  54.5 kg  BMI:  Body mass index is 20.94 kg/m.  Estimated Nutritional Needs:   Kcal:  9794-8016  Protein:  90-105 grams  Fluid:  > 1.7 L    Loistine Chance, RD, LDN, [place] Registered Dietitian II Certified Diabetes Care and Education Specialist Please refer to [facility] for RD and/or RD on-call/weekend/after hours pager

## 2022-08-24 NOTE — TOC Initial Note (Signed)
Transition of Care [facility]) - Initial/Assessment Note    Patient Details  Name: [name] MRN: [id number] Date of Birth: 07-06-1941  Transition of Care [facility]) CM/SW Contact:    Colen Darling, [place] Phone Number: 08/24/2022, 10:41 AM  Clinical Narrative:                  SW completed this assessment with the patient at bedside. The patient lives at [facility]. She is interested in HHPT and does not currently receive home health.  She is current with PCP Dr. Carole Binning. Her pharmacy is [facility] in [place]. She does not have trouble obtaining her medications. DME in the home includes a wheelchair.  Expected Discharge Plan: [place] ([place] at [place]) Barriers to Discharge: Other (must enter comment) ([place])   Patient Goals and CMS Choice Patient states their goals for this hospitalization and ongoing recovery are:: to return home      Expected Discharge Plan and Services Expected Discharge Plan: [place] ([place])       Living arrangements for the past 2 months: [place]                                      Prior Living Arrangements/Services Living arrangements for the past 2 months: [place] Lives with:: Spouse Patient language and need for interpreter reviewed:: No Do you feel safe going back to the place where you live?: Yes            Criminal Activity/Legal Involvement Pertinent to Current Situation/Hospitalization: No - Comment as needed  Activities of Daily Living Home Assistive Devices/Equipment: Walker (specify type) ADL Screening (condition at time of admission) Patient's cognitive ability adequate to safely complete daily activities?: Yes Is the patient deaf or have difficulty hearing?: Yes Does the patient have difficulty seeing, even when wearing  glasses/contacts?: No Does the patient have difficulty concentrating, remembering, or making decisions?: No Patient able to express need for assistance with ADLs?: Yes Does the patient have difficulty dressing or bathing?: Yes Independently performs ADLs?: No Communication: Independent Dressing (OT): Needs assistance Is this a change from baseline?: Pre-admission baseline Grooming: Independent Feeding: Independent Bathing: Needs assistance Is this a change from baseline?: Pre-admission baseline Toileting: Needs assistance Is this a change from baseline?: Pre-admission baseline In/Out Bed: Needs assistance Is this a change from baseline?: Pre-admission baseline Walks in Home: Dependent Is this a change from baseline?: Pre-admission baseline Does the patient have difficulty walking or climbing stairs?: Yes Weakness of Legs: Both Weakness of Arms/Hands: None  Permission Sought/Granted                  Emotional Assessment   Attitude/Demeanor/Rapport: Engaged   Orientation: : Oriented to Self, Oriented to Place, Oriented to  Time, Oriented to Situation      Admission diagnosis:  Confusion [R41.0] Altered mental status [R41.82] Severe sepsis ([place]) [A41.9, R65.20] Acute diverticulitis [K57.92] Sepsis, due to unspecified organism, unspecified whether acute organ dysfunction present [facility]) [A41.9] Patient Active Problem List   Diagnosis Date Noted   Pressure injury of skin 64/40/3474   Acute metabolic encephalopathy 25/95/6387   Severe sepsis ([place]) 08/22/2022   Acute diverticulitis 08/22/2022   Hydroureteronephrosis 08/22/2022   Hypokalemia 08/22/2022   Malnutrition of moderate degree 08/18/2022   Cellulitis  of lower extremity 08/17/2022   Cellulitis of both lower extremities 08/16/2022   Diabetes mellitus without complication (HCC)    Gout    Chronic diastolic CHF (congestive heart failure) ([place])    Iron deficiency anemia    Diarrhea    Cervical spondylosis  03/09/2020   Lower limb ulcer, ankle, right, limited to breakdown of skin ([place]) 09/09/2018   Chronic gouty arthritis 04/29/2018   Generalized osteoarthritis of hand 02/12/2018   Hyperuricemia 02/12/2018   Swelling of finger 02/12/2018   PAD (peripheral artery disease) ([place]) 04/30/2017   Lymphedema 04/30/2017   Bilateral edema of lower extremity 04/03/2017   Ecchymosis 04/03/2017   Leg pain, bilateral 04/03/2017   Acquired hypothyroidism 01/24/2017   Benign essential hypertension 01/24/2017   History of mastectomy, right 01/24/2017   Primary osteoarthritis of left knee 02/29/2016   Cancer of right breast ([place]) 04/17/2015   Chronic pain 03/03/2015   Chronic pain of both knees 03/03/2015   Arthritis 01/11/2014   Soft tissue lesion of shoulder region 01/11/2014   Diabetes mellitus, type 2 ([place]) 01/11/2014   H/O renal calculi 01/11/2014   Hypertension 01/11/2014   HLD (hyperlipidemia) 01/11/2014   Neuropathy 01/11/2014   OP (osteoporosis) 01/11/2014   H/O malignant neoplasm of breast 01/11/2014   PCP:  Tracie Harrier, MD Pharmacy:   [place], [place] - [place] [address] [place] [address] Phone: [phone] Fax: [phone]  [id number] - [place], [place] [facility] [address] AT [place] [address] Phone: [phone] Fax: [phone]     Social Determinants of Health (SDOH) Interventions    Readmission Risk Interventions    08/24/2022   10:41 AM  Readmission Risk Prevention Plan  Transportation Screening Complete  PCP or Specialist Appt within 3-5 Days Complete  HRI or [place] Not Complete  HRI or Home Care Consult comments SW will arrange this admission  Social Work Consult for [place] Planning/Counseling Complete  Palliative Care Screening Not Applicable  Medication Review Press photographer) Complete

## 2022-08-24 NOTE — Care Management Important Message (Signed)
Important Message  Patient Details  Name: [name] MRN: [id number] Date of Birth: 12-Aug-1941   Medicare Important Message Given:  Other (see comment)  Patient is in an isolation room and no answer so unable to obtain a verbal consent today. Will check back on Monday.   Juliann Pulse A Islam [place] 08/24/2022, 2:54 PM

## 2022-08-24 NOTE — Plan of Care (Signed)

## 2022-08-25 DIAGNOSIS — G9341 Metabolic encephalopathy: Secondary | ICD-10-CM | POA: Diagnosis not present

## 2022-08-25 DIAGNOSIS — I5032 Chronic diastolic (congestive) heart failure: Secondary | ICD-10-CM | POA: Diagnosis not present

## 2022-08-25 DIAGNOSIS — A419 Sepsis, unspecified organism: Secondary | ICD-10-CM | POA: Diagnosis not present

## 2022-08-25 DIAGNOSIS — K5792 Diverticulitis of intestine, part unspecified, without perforation or abscess without bleeding: Secondary | ICD-10-CM | POA: Diagnosis not present

## 2022-08-25 LAB — CBC WITH DIFFERENTIAL/PLATELET
Abs Immature Granulocytes: 0.1 10*3/uL — ABNORMAL HIGH (ref 0.00–0.07)
Basophils Absolute: 0 10*3/uL (ref 0.0–0.1)
Basophils Relative: 0 %
Eosinophils Absolute: 0.2 10*3/uL (ref 0.0–0.5)
Eosinophils Relative: 2 %
HCT: 27.1 % — ABNORMAL LOW (ref 36.0–46.0)
Hemoglobin: 8.3 g/dL — ABNORMAL LOW (ref 12.0–15.0)
Immature Granulocytes: 1 %
Lymphocytes Relative: 24 %
Lymphs Abs: 1.8 10*3/uL (ref 0.7–4.0)
MCH: 25.7 pg — ABNORMAL LOW (ref 26.0–34.0)
MCHC: 30.6 g/dL (ref 30.0–36.0)
MCV: 83.9 fL (ref 80.0–100.0)
Monocytes Absolute: 0.5 10*3/uL (ref 0.1–1.0)
Monocytes Relative: 7 %
Neutro Abs: 5 10*3/uL (ref 1.7–7.7)
Neutrophils Relative %: 66 %
Platelets: 306 10*3/uL (ref 150–400)
RBC: 3.23 MIL/uL — ABNORMAL LOW (ref 3.87–5.11)
RDW: 19.8 % — ABNORMAL HIGH (ref 11.5–15.5)
WBC: 7.7 10*3/uL (ref 4.0–10.5)
nRBC: 0 % (ref 0.0–0.2)

## 2022-08-25 LAB — BASIC METABOLIC PANEL
Anion gap: 7 (ref 5–15)
BUN: 7 mg/dL — ABNORMAL LOW (ref 8–23)
CO2: 23 mmol/L (ref 22–32)
Calcium: 8.6 mg/dL — ABNORMAL LOW (ref 8.9–10.3)
Chloride: 110 mmol/L (ref 98–111)
Creatinine, Ser: 0.62 mg/dL (ref 0.44–1.00)
GFR, Estimated: >60 mL/min (ref >60)
Glucose, Bld: 125 mg/dL — ABNORMAL HIGH (ref 70–99)
Potassium: 3.7 mmol/L (ref 3.5–5.1)
Sodium: 140 mmol/L (ref 135–145)

## 2022-08-25 LAB — GLUCOSE, CAPILLARY: Glucose-Capillary: 135 mg/dL — ABNORMAL HIGH (ref 70–99)

## 2022-08-25 LAB — PHOSPHORUS: Phosphorus: 2.4 mg/dL — ABNORMAL LOW (ref 2.5–4.6)

## 2022-08-25 LAB — MAGNESIUM: Magnesium: 1.7 mg/dL (ref 1.7–2.4)

## 2022-08-25 MED ORDER — BACID PO TABS: 2.0000 | ORAL_TABLET | Freq: Two times a day (BID) | ORAL | 0 refills | Status: AC

## 2022-08-25 MED ORDER — AMOXICILLIN-POT CLAVULANATE 875-125 MG PO TABS: 1.0000 | ORAL_TABLET | Freq: Three times a day (TID) | ORAL | 0 refills | Status: DC

## 2022-08-25 MED ORDER — BACID PO TABS: 2.0000 | ORAL_TABLET | Freq: Two times a day (BID) | ORAL | 0 refills | Status: DC

## 2022-08-25 MED ORDER — AMOXICILLIN-POT CLAVULANATE 875-125 MG PO TABS: 1.0000 | ORAL_TABLET | Freq: Three times a day (TID) | ORAL | 0 refills | Status: AC

## 2022-08-25 NOTE — Progress Notes (Addendum)
Physical Therapy Treatment Patient Details Name: [name] MRN: [id number] DOB: Feb 11, 1941 Today's Date: 08/25/2022   History of Present Illness [name] is a 61yoF here for sepsis, fall prior to admission while trying to perform toiletting.    PT Comments    Patient seen for follow up prior to discharge at request of nursing/physician due to increased weakness with transfers. Patient endorsing generalized weakness and chronic back pain, but agreeable for OOB to chair with therapist. Does require increased assistance for all mobility activities this date--min/mod assist for sit/stand, SPT from bed/chair.  Generally weak and deconditioned; unsafe to attempt without RW and +1 at all times. Given change in functional ability, could benefit from increased therapy prior to return home to increase functional independence and decrease burdent of care; recs updated to STR. However, if patient/family comfortable managing care in ALF environment with [facility] services, recommend max HH services (PT, OT, RN and aide), RW and [facility] for discharge.  RN/MD/TOC informed and aware.     Durable Medical Equipment  (From admission, onward)           Start     Ordered   08/25/22 1237  For home use only DME Bedside commode  Once       Comments: Patient is not able to walk the distance required to go the bathroom, or he/she is unable to safely negotiate stairs required to access the bathroom.  A 3in1 BSC will alleviate this problem  Question:  Patient needs a bedside commode to treat with the following condition  Answer:  Generalized weakness   08/25/22 1237   08/25/22 1230  For home use only DME Walker rolling  Once       Question Answer Comment  Walker: With [place]   Patient needs a walker to treat with the following condition General weakness      08/25/22 1229             Recommendations for follow up therapy are one component of a multi-disciplinary discharge planning process,  led by the attending physician.  Recommendations may be updated based on patient status, additional functional criteria and insurance authorization.  Follow Up Recommendations  Skilled nursing-short term rehab (<3 hours/day) Can patient physically be transported by private vehicle: Yes   Assistance Recommended at Discharge Frequent or constant Supervision/Assistance  Patient can return home with the following Assist for transportation;Assistance with cooking/housework;A lot of help with walking and/or transfers;A lot of help with bathing/dressing/bathroom   Equipment Recommendations  Rolling walker (2 wheels);BSC/3in1    Recommendations for Other Services       Precautions / Restrictions Precautions Precautions: Fall Restrictions Weight Bearing Restrictions: No     Mobility  Bed Mobility Overal bed mobility: Needs Assistance Bed Mobility: Supine to Sit     Supine to sit: Min assist     General bed mobility comments: increased effort/time to complete; assist for truncal elevation    Transfers Overall transfer level: Needs assistance Equipment used: Rolling walker (2 wheels) Transfers: Bed to chair/wheelchair/BSC Sit to Stand: Min assist, Mod assist Stand pivot transfers: Mod assist         General transfer comment: increased time effort for lift off and standing balance; hand-over-hand assist for RW position and management. Very short step height/length.   Maintains triple-flexed posture, poor balance and functional activity tolerance.  Unsafe to attempt without +1 assist, and does prefer use of RW (versus baseline SPT without assist device)    Ambulation/Gait  General Gait Details: non-ambulatory at baseline   Stairs             Wheelchair Mobility    Modified Rankin (Stroke Patients Only)       Balance Overall balance assessment: Needs assistance Sitting-balance support: No upper extremity supported, Feet supported Sitting  balance-Leahy Scale: Fair     Standing balance support: Bilateral upper extremity supported Standing balance-Leahy Scale: Poor                              Cognition Arousal/Alertness: Awake/alert Behavior During Therapy: WFL for tasks assessed/performed, Flat affect Overall Cognitive Status: Within Functional Limits for tasks assessed                                          Exercises      General Comments        Pertinent Vitals/Pain Pain Assessment Pain Assessment: Faces Faces Pain Scale: Hurts little more Pain Location: back Pain Descriptors / Indicators: Aching Pain Intervention(s): Limited activity within patient's tolerance, Monitored during session, Repositioned    Home Living                          Prior Function            PT Goals (current goals can now be found in the care plan section) Acute Rehab PT Goals Patient Stated Goal: feel better, go home PT Goal Formulation: All assessment and education complete, DC therapy Time For Goal Achievement: 09/06/22 Potential to Achieve Goals: Good Progress towards PT goals: Progressing toward goals    Frequency    Min 2X/week      PT Plan Discharge plan needs to be updated    Co-evaluation              AM-PAC PT "6 Clicks" Mobility   Outcome Measure  Help needed turning from your back to your side while in a flat bed without using bedrails?: A Little Help needed moving from lying on your back to sitting on the side of a flat bed without using bedrails?: A Little Help needed moving to and from a bed to a chair (including a wheelchair)?: A Lot Help needed standing up from a chair using your arms (e.g., wheelchair or bedside chair)?: A Lot Help needed to walk in hospital room?: Total Help needed climbing 3-5 steps with a railing? : Total 6 Click Score: 12    End of Session Equipment Utilized During Treatment: Gait belt Activity Tolerance: Patient  tolerated treatment well Patient left: in chair;with call bell/phone within reach;with chair alarm set Nurse Communication: Mobility status PT Visit Diagnosis: Other abnormalities of gait and mobility (R26.89);Muscle weakness (generalized) (M62.81);History of falling (Z91.81)     Time: 1210-1224 PT Time Calculation (min) (ACUTE ONLY): 14 min  Charges:  $Therapeutic Activity: 8-22 mins                     Lorrie Strauch H. Owens Shark, PT, DPT, NCS 08/25/22, 12:40 PM [phone]

## 2022-08-25 NOTE — Plan of Care (Signed)
  Problem: Education: Goal: Knowledge of General Education information will improve Description: Including pain rating scale, medication(s)/side effects and non-pharmacologic comfort measures 08/25/2022 1418 by Gwendel Hanson, LPN Outcome: Adequate for Discharge 08/25/2022 1149 by Gwendel Hanson, LPN Outcome: Progressing   Problem: Health Behavior/Discharge Planning: Goal: Ability to manage health-related needs will improve 08/25/2022 1418 by Gwendel Hanson, LPN Outcome: Adequate for Discharge 08/25/2022 1149 by Gwendel Hanson, LPN Outcome: Progressing   Problem: Clinical Measurements: Goal: Ability to maintain clinical measurements within normal limits will improve 08/25/2022 1418 by Gwendel Hanson, LPN Outcome: Adequate for Discharge 08/25/2022 1149 by Gwendel Hanson, LPN Outcome: Progressing Goal: Will remain free from infection 08/25/2022 1418 by Gwendel Hanson, LPN Outcome: Adequate for Discharge 08/25/2022 1149 by Gwendel Hanson, LPN Outcome: Progressing Goal: Diagnostic test results will improve 08/25/2022 1418 by Gwendel Hanson, LPN Outcome: Adequate for Discharge 08/25/2022 1149 by Gwendel Hanson, LPN Outcome: Progressing Goal: Respiratory complications will improve 08/25/2022 1418 by Gwendel Hanson, LPN Outcome: Adequate for Discharge 08/25/2022 1149 by Gwendel Hanson, LPN Outcome: Progressing Goal: Cardiovascular complication will be avoided 08/25/2022 1418 by Gwendel Hanson, LPN Outcome: Adequate for Discharge 08/25/2022 1149 by Gwendel Hanson, LPN Outcome: Progressing   Problem: Activity: Goal: Risk for activity intolerance will decrease 08/25/2022 1418 by Gwendel Hanson, LPN Outcome: Adequate for Discharge 08/25/2022 1149 by Gwendel Hanson, LPN Outcome: Progressing   Problem: Nutrition: Goal: Adequate nutrition will be maintained 08/25/2022 1418 by Gwendel Hanson, LPN Outcome: Adequate for Discharge 08/25/2022 1149 by Gwendel Hanson, LPN Outcome:  Progressing   Problem: Coping: Goal: Level of anxiety will decrease 08/25/2022 1418 by Gwendel Hanson, LPN Outcome: Adequate for Discharge 08/25/2022 1149 by Gwendel Hanson, LPN Outcome: Progressing   Problem: Elimination: Goal: Will not experience complications related to bowel motility 08/25/2022 1418 by Gwendel Hanson, LPN Outcome: Adequate for Discharge 08/25/2022 1149 by Gwendel Hanson, LPN Outcome: Progressing Goal: Will not experience complications related to urinary retention 08/25/2022 1418 by Gwendel Hanson, LPN Outcome: Adequate for Discharge 08/25/2022 1149 by Gwendel Hanson, LPN Outcome: Progressing   Problem: Pain Managment: Goal: General experience of comfort will improve 08/25/2022 1418 by Gwendel Hanson, LPN Outcome: Adequate for Discharge 08/25/2022 1149 by Gwendel Hanson, LPN Outcome: Progressing   Problem: Safety: Goal: Ability to remain free from injury will improve 08/25/2022 1418 by Gwendel Hanson, LPN Outcome: Adequate for Discharge 08/25/2022 1149 by Gwendel Hanson, LPN Outcome: Progressing   Problem: Skin Integrity: Goal: Risk for impaired skin integrity will decrease 08/25/2022 1418 by Gwendel Hanson, LPN Outcome: Adequate for Discharge 08/25/2022 1149 by Gwendel Hanson, LPN Outcome: Progressing

## 2022-08-25 NOTE — TOC Transition Note (Addendum)
Transition of Care [facility]) - CM/SW Discharge Note   Patient Details  Name: [name] MRN: [id number] Date of Birth: [birth year]  Transition of Care [facility]) CM/SW Contact:  Valente David, RN Phone Number: 08/25/2022, 11:55 AM   Clinical Narrative:     Notified that patient is medically ready for discharge back to [facility]F.  Spoke with [facility], they will provide transportation back to facility, aware that patient will need HHPT.  Request made for provider to send new medications to [facility] at [address] (corner of [place]).  Patient aware of discharge, she will communicate with husband once she is ready to go.    Update 1454: Noted that PT recommended DME for discharge home (3in1 BSC and rolling walker).  Patient report she already has equipment, does not need it ordered.  Final next level of care: [place] Barriers to Discharge: Barriers Resolved   Patient Goals and CMS Choice Patient states their goals for this hospitalization and ongoing recovery are:: Home (ALF) with HHPT      Discharge Placement                       Discharge Plan and Services                          HH Arranged: PT [place] Agency:  (Provided by [name]) Date [place]: 08/25/22 Time [place]: 5009 Representative spoke with at [place]: Spoke with [employer] representative  Social Determinants of Health ([place]) Interventions     Readmission Risk Interventions    08/24/2022   10:41 AM  Readmission Risk Prevention Plan  Transportation Screening Complete  PCP or Specialist Appt within 3-5 Days Complete  HRI or [name] Not Complete  HRI or Home Care Consult comments SW will arrange this admission  Social Work Consult for [place] Planning/Counseling Complete  Palliative Care Screening Not Applicable  Medication Review Press photographer) Complete

## 2022-08-25 NOTE — NC FL2 (Signed)
[place] LEVEL OF CARE SCREENING TOOL     IDENTIFICATION  Patient Name: [name] Birthdate: Feb 05, 1941 Sex: female Admission Date (Current Location): 08/22/2022  [place] and [place] Number:  Engineering geologist and Address:  [facility], [address], [place], [place] [address]      Provider Number: [id number]  Attending Physician Name and Address:  Jennye Boroughs, MD  Relative Name and Phone Number:  Cayleen Benjamin    Current Level of Care: Other (Comment) ([place], ALF) Recommended Level of Care: [place] Prior Approval Number:    Date Approved/Denied:   PASRR Number:    Discharge Plan: Other (Comment) (ALF, [place])    Current Diagnoses: Patient Active Problem List   Diagnosis Date Noted   Pressure injury of skin 52/84/1324   Acute metabolic encephalopathy 40/08/2724   Severe sepsis ([place]) 08/22/2022   Acute diverticulitis 08/22/2022   Hydroureteronephrosis 08/22/2022   Hypokalemia 08/22/2022   Malnutrition of moderate degree 08/18/2022   Cellulitis of lower extremity 08/17/2022   Cellulitis of both lower extremities 08/16/2022   Diabetes mellitus without complication (HCC)    Gout    Chronic diastolic CHF (congestive heart failure) (HCC)    Iron deficiency anemia    Diarrhea    Cervical spondylosis 03/09/2020   Lower limb ulcer, ankle, right, limited to breakdown of skin ([place]) 09/09/2018   Chronic gouty arthritis 04/29/2018   Generalized osteoarthritis of hand 02/12/2018   Hyperuricemia 02/12/2018   Swelling of finger 02/12/2018   PAD (peripheral artery disease) ([place]) 04/30/2017   Lymphedema 04/30/2017   Bilateral edema of lower extremity 04/03/2017   Ecchymosis 04/03/2017   Leg pain, bilateral 04/03/2017   Acquired hypothyroidism 01/24/2017   Benign essential hypertension 01/24/2017   History of mastectomy, right 01/24/2017   Primary osteoarthritis of left knee  02/29/2016   Cancer of right breast ([place]) 04/17/2015   Chronic pain 03/03/2015   Chronic pain of both knees 03/03/2015   Arthritis 01/11/2014   Soft tissue lesion of shoulder region 01/11/2014   Diabetes mellitus, type 2 ([place]) 01/11/2014   H/O renal calculi 01/11/2014   Hypertension 01/11/2014   HLD (hyperlipidemia) 01/11/2014   Neuropathy 01/11/2014   OP (osteoporosis) 01/11/2014   H/O malignant neoplasm of breast 01/11/2014    Orientation RESPIRATION BLADDER Height & Weight     Self, Time, Situation, Place  Normal Continent Weight: 55.3 kg Height:  '5\' 4"'$  (162.6 cm)  BEHAVIORAL SYMPTOMS/MOOD NEUROLOGICAL BOWEL NUTRITION STATUS      Continent Diet  AMBULATORY STATUS COMMUNICATION OF NEEDS Skin   Limited Assist   PU Stage and Appropriate Care PU Stage 1 Dressing:  (foam dressing as needed)                     Personal Care Assistance Level of Assistance              Functional Limitations Info             SPECIAL CARE FACTORS FREQUENCY                       Contractures Contractures Info: Not present    Additional Factors Info  Code Status, Allergies Code Status Info: Full Allergies Info: Other High  Anaphylaxis Anesthisia has been a health issue for her in the past.   Sulfa Antibiotics Low  Rash             Discharge  Medications: DISCHARGE MEDICATION: Allergies as of 08/25/2022         Reactions    Other Anaphylaxis    Anesthisia has been a health issue for her in the past.     Sulfa Antibiotics Rash            Medication List       STOP taking these medications     linezolid 600 MG tablet Commonly known as: ZYVOX    sulfamethoxazole-trimethoprim 800-160 MG tablet Commonly known as: BACTRIM DS           TAKE these medications     acetaminophen 500 MG tablet Commonly known as: TYLENOL Take 500-1,000 mg by mouth 4 (four) times daily as needed for mild pain or moderate pain.    allopurinol 300 MG tablet Commonly known  as: ZYLOPRIM Take 300 mg by mouth daily.    amoxicillin-clavulanate 875-125 MG tablet Commonly known as: AUGMENTIN Take 1 tablet by mouth 3 (three) times daily for 7 days.    atenolol 50 MG tablet Commonly known as: TENORMIN Take 50 mg by mouth daily.    atorvastatin 40 MG tablet Commonly known as: LIPITOR Take 40 mg by mouth daily.    cyanocobalamin 1000 MCG tablet Commonly known as: VITAMIN B12 Take 1,000 mcg by mouth daily.    diphenoxylate-atropine 2.5-0.025 MG tablet Commonly known as: LOMOTIL Take 1-2 tablets by mouth 3 (three) times daily as needed.    furosemide 40 MG tablet Commonly known as: LASIX Take 40 mg by mouth daily.    gentamicin cream 0.1 % Commonly known as: GARAMYCIN Apply 1 Application topically in the morning and at bedtime.    HYDROcodone-acetaminophen 7.5-325 MG tablet Commonly known as: NORCO Take 1 tablet by mouth 2 (two) times daily as needed for moderate pain or severe pain.    lactobacillus acidophilus Tabs tablet Take 2 tablets by mouth 2 (two) times daily for 10 days.    levothyroxine 88 MCG tablet Commonly known as: SYNTHROID Take 88 mcg by mouth daily.    metFORMIN 1000 MG tablet Commonly known as: GLUCOPHAGE Take 1,000 mg by mouth in the morning and at bedtime.    sitaGLIPtin 50 MG tablet Commonly known as: JANUVIA Take 50 mg by mouth daily.       Relevant Imaging Results:  Relevant Lab Results:   Additional Information SSN - [id number]  Valente David, RN

## 2022-08-25 NOTE — Discharge Summary (Addendum)
Physician Discharge Summary   Patient: [name] MRN: [id number] DOB: 03/10/1941  Admit date:     08/22/2022  Discharge date: 08/25/22  Discharge Physician: Jennye Boroughs   PCP: Tracie Harrier, MD   Recommendations at discharge:   Follow-up with PCP in 1 week Follow-up with Dr. Izora Ribas, neurosurgeon, in 2 weeks Follow-up with Dr. Glori Luis, urologist, in 1 week  Discharge Diagnoses: Principal Problem:   Severe sepsis [facility]) Active Problems:   Acute diverticulitis   Chronic diastolic CHF (congestive heart failure) ([place])   Diabetes mellitus without complication ([place])   Acute metabolic encephalopathy   Hydroureteronephrosis   Hypokalemia   Pressure injury of skin  Resolved Problems:   * No resolved hospital problems. *  Hospital Course:  [name] is a 81 y.o. female with medical history significant for recent discharge from the hospital on 05/18/2022 after hospitalization for cellulitis of the lower extremities (treated with IV ceftriaxone), hypertension, hyperlipidemia, diabetes mellitus, hypothyroidism, gout, breast cancer s/p radiation and mastectomy, PVD, iron deficiency anemia, chronic diastolic CHF. She was seen in the emergency room on 08/21/2022 for back pain and was discharged home.  She presented to the emergency department again on 08/22/2022 because of change in mental status.  Roderic Palau, her son, was at the bedside and he provided most of the day history.  He said that patient felt very warm to touch and was shivering this morning.  He did not check her temperature.  Patient normally gets around in a wheelchair.  However, on the morning of admission, she fell as she was going to the toilet.  She was incontinent of urine and feces which is unusual for her.  She was also confused and was talking out of her head.  No vomiting, abdominal pain, chest pain, shortness of breath or loss of consciousness.    Assessment and Plan:  Severe sepsis secondary to  acute sigmoid and distal colonic diverticulitis: Improved.  She will be discharged home on a 7-day course of Augmentin.     Diarrhea: Improved.  Probiotics have been prescribed because she will be discharged on oral antibiotics.  Acute metabolic encephalopathy: Improved   Distended bladder, bilateral moderate hydroureteronephrosis:  Consulted Dr. Diamantina Providence, urologist, on 08/23/2022.  He recommended that patient be discharged with Foley catheter with outpatient follow-up in 1 week (discussed via secure chat on 08/23/2022).Marland Kitchen   Hypokalemia and hypomagnesemia: Improved   Acute on chronic back pain, recent fall at home, severe spinal canal stenosis at L2-L3, L3-L4 and L4-L5.  No acute fracture noted on CT lumbar spine done on 08/21/2022.  No acute fracture noted on MRI lumbar spine but there was evidence of severe lumbar spinal stenosis at L2-3 and L4-5 due to combination of disc bulge and facet arthrosis.  Outpatient follow-up with neurosurgery recommended.  Continue analgesics as needed for pain  Prolonged QTc interval 511: Repeat EKG showed QTc 453 on 08/24/2022.   Anemia of chronic disease: difficult to exclude iron deficiency anemia at this point because of acute infection.  Vitamin B12 level was normal (827).  Monitor H&H as an outpatient  Chronic diastolic CHF: Compensated.  Restart Lasix at discharge   Type II DM: Resume metformin and Januvia at discharge   Generalized weakness: She was reevaluated by PT today.  Roderic Palau, son prefer that patient be discharged back to ALF so she will be discharged with home health PT, OT, RN and aide per recommendations from [place], [place]  Stage I decubitus ulcer on bilateral buttocks : Turn  patient every 2 hours.  Apply Mepilex border erythematous area on bilateral buttocks   Recent lower extremity cellulitis: Discontinue Zyvox and Bactrim at discharge    Discharge plan was discussed with Roderic Palau, son, over the phone      Consultants: None Procedures  performed: None  Disposition: Home Diet recommendation:  Discharge Diet Orders (From admission, onward)     Start     Ordered   08/25/22 0000  DIET DYS 3       Question:  Fluid consistency:  Answer:  Thin   08/25/22 1014           Dysphagia type 3 thin Liquid DISCHARGE MEDICATION: Allergies as of 08/25/2022       Reactions   Other Anaphylaxis   Anesthisia has been a health issue for her in the past.    Sulfa Antibiotics Rash        Medication List     STOP taking these medications    linezolid 600 MG tablet Commonly known as: ZYVOX   sulfamethoxazole-trimethoprim 800-160 MG tablet Commonly known as: BACTRIM DS       TAKE these medications    acetaminophen 500 MG tablet Commonly known as: TYLENOL Take 500-1,000 mg by mouth 4 (four) times daily as needed for mild pain or moderate pain.   allopurinol 300 MG tablet Commonly known as: ZYLOPRIM Take 300 mg by mouth daily.   amoxicillin-clavulanate 875-125 MG tablet Commonly known as: AUGMENTIN Take 1 tablet by mouth 3 (three) times daily for 7 days.   atenolol 50 MG tablet Commonly known as: TENORMIN Take 50 mg by mouth daily.   atorvastatin 40 MG tablet Commonly known as: LIPITOR Take 40 mg by mouth daily.   cyanocobalamin 1000 MCG tablet Commonly known as: VITAMIN B12 Take 1,000 mcg by mouth daily.   diphenoxylate-atropine 2.5-0.025 MG tablet Commonly known as: LOMOTIL Take 1-2 tablets by mouth 3 (three) times daily as needed.   furosemide 40 MG tablet Commonly known as: LASIX Take 40 mg by mouth daily.   gentamicin cream 0.1 % Commonly known as: GARAMYCIN Apply 1 Application topically in the morning and at bedtime.   HYDROcodone-acetaminophen 7.5-325 MG tablet Commonly known as: NORCO Take 1 tablet by mouth 2 (two) times daily as needed for moderate pain or severe pain.   lactobacillus acidophilus Tabs tablet Take 2 tablets by mouth 2 (two) times daily for 10 days.   levothyroxine 88  MCG tablet Commonly known as: SYNTHROID Take 88 mcg by mouth daily.   metFORMIN 1000 MG tablet Commonly known as: GLUCOPHAGE Take 1,000 mg by mouth in the morning and at bedtime.   sitaGLIPtin 50 MG tablet Commonly known as: JANUVIA Take 50 mg by mouth daily.               Discharge Care Instructions  (From admission, onward)           Start     Ordered   08/25/22 0000  Discharge wound care:       Comments: Apply Mepilex border to sacral area and avoid laying or sitting on your buttocks for too long   08/25/22 1014            Follow-up Information     Meade Maw, MD. Schedule an appointment as soon as possible for a visit in 2 week(s).   Specialty: Neurosurgery Why: severe lumbar spinal stenosis L2-3, L4-5, bulging disc Contact information: [address] [place] [address] [phone]  Billey Co, MD. Schedule an appointment as soon as possible for a visit in 1 week(s).   Specialty: Urology Why: Hydroureteronephrosis, urinary retention Contact information: [place] [address] [phone]                Discharge Exam: Danley Danker Weights   08/22/22 0811  Weight: 55.3 kg   GEN: NAD SKIN: Warm and dry EYES: No pallor or icterus ENT: MMM CV: RRR PULM: CTA B ABD: soft, ND, NT, +BS CNS: AAO x 3, non focal EXT: No edema or tenderness   Condition at discharge: good  The results of significant diagnostics from this hospitalization (including imaging, microbiology, ancillary and laboratory) are listed below for reference.   Imaging Studies: MR LUMBAR SPINE WO CONTRAST  Result Date: 08/24/2022 CLINICAL DATA:  Low back pain recent fall EXAM: MRI LUMBAR SPINE WITHOUT CONTRAST TECHNIQUE: Multiplanar, multisequence MR imaging of the lumbar spine was performed. No intravenous contrast was administered. COMPARISON:  None Available. FINDINGS: Segmentation:  Standard. Alignment:  Grade 1  retrolisthesis at L1-2 and L2-3 Vertebrae:  No fracture, evidence of discitis, or bone lesion. Conus medullaris and cauda equina: Conus extends to the L1 level. Conus and cauda equina appear normal. Paraspinal and other soft tissues: Left renal atrophy Disc levels: T12-L1: Small left subarticular disc protrusion without stenosis. L1-L2: Disc space narrowing with small bulge, worsened. Right lateral recess narrowing without central spinal canal stenosis. No neural foraminal stenosis. L2-L3: Small disc bulge and moderate facet arthrosis, worsened. Severe spinal canal stenosis. Mild right and severe left neural foraminal stenosis. L3-L4: Mild disc bulge with moderate facet arthrosis, unchanged. Mild spinal canal stenosis. No neural foraminal stenosis. L4-L5: Medium-sized disc bulge with severe facet arthrosis. Worsened spinal canal stenosis. Mild bilateral neural foraminal stenosis. L5-S1: Severe facet arthrosis and small disc bulge. No spinal canal stenosis. No neural foraminal stenosis. Visualized sacrum: Normal. IMPRESSION: 1. No acute abnormality of the lumbar spine. 2. Worsened spinal canal stenosis at L2-3 and L4-5 due to combination of disc bulge and facet arthrosis. 3. Severe left L2-3 neural foraminal stenosis. 4. Mild bilateral L4-5 neural foraminal stenosis. 5. Severe facet arthrosis at L4-5 and L5-S1 may serve as a source of local low back pain. Electronically Signed   By: Ulyses Jarred M.D.   On: 08/24/2022 20:06   CT ABDOMEN PELVIS W CONTRAST  Result Date: 08/22/2022 CLINICAL DATA:  Abdominal pain, acute, nonlocalized. Sepsis. Bilateral lower extremity cellulitis. Incontinent of bowel and bladder. Hyperglycemia. EXAM: CT ABDOMEN AND PELVIS WITH CONTRAST TECHNIQUE: Multidetector CT imaging of the abdomen and pelvis was performed using the standard protocol following bolus administration of intravenous contrast. RADIATION DOSE REDUCTION: This exam was performed according to the departmental  dose-optimization program which includes automated exposure control, adjustment of the mA and/or kV according to patient size and/or use of iterative reconstruction technique. CONTRAST:  144m OMNIPAQUE IOHEXOL 300 MG/ML  SOLN COMPARISON:  AP chest 08/22/2022, CT abdomen pelvis without contrast 09/26/2018 FINDINGS: Lower chest: Mild ground-glass likely subsegmental atelectasis within the bilateral posterior lung bases. Partial visualization of postsurgical changes of prior mastectomy again noted. Heart size is again mildly to moderately enlarged. Moderate mitral annular calcifications. Trace pericardial fluid is similar to prior. Hepatobiliary: Smooth liver contours. Status post cholecystectomy. Minimal central intrahepatic biliary ductal dilatation, presumably postsurgical. Pancreas: Large pancreatic parenchymal atrophy, similar to prior. No pancreatic mass or inflammatory change is seen. No pancreatic ductal dilatation. Spleen: Normal in size without focal abnormality. Adrenals/Urinary Tract: Normal bilateral adrenals.  The urinary bladder is moderately to markedly distended. No focal urinary bladder wall thickening. Mild edema/fat stranding peripheral to the anterior left aspect of the urinary bladder (axial series 2, image 65, coronal series 5, image 18). There is moderate distention of bilateral ureters diffusely, down to the bilateral ureterovesicular junctions. New moderate bilateral hydronephrosis. Redemonstration of normal variant bilateral extrarenal pelves. There are again moderate left and mild right areas of renal cortical atrophy and scarring. New mild bilateral perinephric fat stranding. Stomach/Bowel: There is diffuse moderate thickening of the sigmoid and distal descending colon walls. Mild-to-moderate scattered diverticula in these regions. Findings are suspicious for acute diverticulitis. No bowel perforation or abscess is seen. The terminal ileum is unremarkable. A small blind-ending tubular  structure favored to represent the normal appendix is seen on axial series 2 images 49 through 52. No dilated loops of bowel to indicate bowel obstruction. Vascular/Lymphatic: The abdominal aortic aneurysm. The major intra-abdominal aortic branch vessels are patent. Mild-to-moderate atherosclerotic calcifications. No mesenteric, retroperitoneal, or pelvic pathologically enlarged lymph nodes by CT criteria. Reproductive: The uterus is surgically absent. No gross adnexal abnormality is seen. Other: Unchanged moderate fat containing umbilical hernia measuring up to 2.4 cm in transverse dimension. Trace free fluid within the pelvis. No pneumoperitoneum. Musculoskeletal: Mild levocurvature of the thoracolumbar junction mild dextrocurvature centered at L4. Mild retrolisthesis of L1 on L2 and L2 on L3. Severe L1-2 and L2-3 disc space narrowing. Grade 1 anterolisthesis of L4 on L5. Severe multilevel facet joint degenerative changes. IMPRESSION: 1. Moderate to markedly distended urinary bladder with new moderate bilateral hydroureteronephrosis. This may be secondary to partial urinary bladder outlet obstruction. There is mild edema/fat stranding peripheral to the anterior left aspect of the urinary bladder, possible fairly cystitis. Recommend correlation with urinalysis. 2. Mild-to-moderate sigmoid diverticulosis with inflammatory changes suggesting diffuse sigmoid and distal descending colon acute diverticulitis. No bowel perforation or drainable fluid collection. 3. Additional chronic findings as above. Aortic Atherosclerosis (ICD10-I70.0). Electronically Signed   By: Yvonne Kendall M.D.   On: 08/22/2022 10:40   DG Chest Port 1 View  Result Date: 08/22/2022 CLINICAL DATA:  81 year old female with possible sepsis. Altered mental status and lower extremity cellulitis. EXAM: PORTABLE CHEST 1 VIEW COMPARISON:  Portable chest 05/26/2012. lumbar spine CT yesterday. FINDINGS: Portable AP upright view at 0735 hours. Low lung  volumes. But left lung base ventilation appears improved since 2013 and Allowing for portable technique the lungs are clear. Mediastinal contours are within normal limits. Visualized tracheal air column is within normal limits. No pneumothorax or pleural effusion identified. No acute osseous abnormality identified. Negative visible bowel gas. IMPRESSION: Low lung volumes, otherwise no acute cardiopulmonary abnormality. Electronically Signed   By: Genevie Ann M.D.   On: 08/22/2022 07:46   CT Lumbar Spine Wo Contrast  Result Date: 08/21/2022 CLINICAL DATA:  Increased back pain. EXAM: CT LUMBAR SPINE WITHOUT CONTRAST TECHNIQUE: Multidetector CT imaging of the lumbar spine was performed without intravenous contrast administration. Multiplanar CT image reconstructions were also generated. RADIATION DOSE REDUCTION: This exam was performed according to the departmental dose-optimization program which includes automated exposure control, adjustment of the mA and/or kV according to patient size and/or use of iterative reconstruction technique. COMPARISON:  04/18/09 MRI L Spine FINDINGS: Segmentation: 5 lumbar type vertebrae. Alignment: Retrolisthesis of L2 L2-L3.  Grade 1 L4 on L5. Vertebrae: There are multilevel degenerative endplate changes. No evidence of fracture. Paraspinal and other soft tissues: Extrarenal pelvis bilaterally. There is fatty atrophy of the paraspinal musculature. Disc levels:  T11-T12: No evidence of spinal canal or neural foraminal stenosis. T12-L1: Small disc bulge. No definite evidence of spinal or neural foraminal stenosis. L1-L2: Moderate spinal canal stenosis. Moderate bilateral neural foraminal stenosis. Severe bilateral facet degenerative change. L2-L3: Severe spinal canal stenosis. Severe bilateral neural foraminal stenosis. Severe bilateral facet degenerative change. L3-L4: Severe spinal canal stenosis. Mild-to-moderate bilateral neural foraminal stenosis. Severe bilateral facet degenerative  change. L4-L5: Severe spinal canal stenosis. Mild bilateral neural foraminal stenosis. Severe bilateral facet degenerative change. L5-S1: No significant spinal canal stenosis. Mild bilateral neural foraminal narrowing. Severe bilateral facet degenerative change. IMPRESSION: 1. Severe spinal canal stenosis at L2-L3, L3-L4, and L4-L5. These findings have progressed from 2010. Consider further evaluation with a lumbar spine MRI, as clinically indicated. 2. Severe bilateral neural foraminal stenosis at L2-L3 and L3-L4. 3. No acute fracture. Electronically Signed   By: Marin Roberts M.D.   On: 08/21/2022 14:30   US Venous Img Lower Bilateral (DVT)  Result Date: 08/17/2022 CLINICAL DATA:  Bilateral lower extremity cellulitis. History of malignancy. Evaluate for DVT. EXAM: BILATERAL LOWER EXTREMITY VENOUS DOPPLER ULTRASOUND TECHNIQUE: Gray-scale sonography with graded compression, as well as color Doppler and duplex ultrasound were performed to evaluate the lower extremity deep venous systems from the level of the common femoral vein and including the common femoral, femoral, profunda femoral, popliteal and calf veins including the posterior tibial, peroneal and gastrocnemius veins when visible. The superficial great saphenous vein was also interrogated. Spectral Doppler was utilized to evaluate flow at rest and with distal augmentation maneuvers in the common femoral, femoral and popliteal veins. COMPARISON:  Bilateral lower extremity venous Doppler ultrasound-06/02/2018 (negative). FINDINGS: RIGHT LOWER EXTREMITY Common Femoral Vein: No evidence of thrombus. Normal compressibility, respiratory phasicity and response to augmentation. Saphenofemoral Junction: No evidence of thrombus. Normal compressibility and flow on color Doppler imaging. Profunda Femoral Vein: No evidence of thrombus. Normal compressibility and flow on color Doppler imaging. Femoral Vein: No evidence of thrombus. Normal compressibility, respiratory  phasicity and response to augmentation. Popliteal Vein: No evidence of thrombus. Normal compressibility, respiratory phasicity and response to augmentation. Calf Veins: Appear patent where visualized. Superficial Great Saphenous Vein: No evidence of thrombus. Normal compressibility. Other Findings: Note is made of an approximately 2.5 x 1.5 x 0.5 cm anechoic fluid collection with the right popliteal fossa compatible with a Baker's cyst. LEFT LOWER EXTREMITY Common Femoral Vein: No evidence of thrombus. Normal compressibility, respiratory phasicity and response to augmentation. Saphenofemoral Junction: No evidence of thrombus. Normal compressibility and flow on color Doppler imaging. Profunda Femoral Vein: No evidence of thrombus. Normal compressibility and flow on color Doppler imaging. Femoral Vein: No evidence of thrombus. Normal compressibility, respiratory phasicity and response to augmentation. Popliteal Vein: No evidence of thrombus. Normal compressibility, respiratory phasicity and response to augmentation. Calf Veins: No evidence of thrombus. Normal compressibility and flow on color Doppler imaging. Superficial Great Saphenous Vein: No evidence of thrombus. Normal compressibility. Venous Reflux:  Appear patent where visualized. Other Findings:  None IMPRESSION: 1. No evidence of DVT within either lower extremity. 2. Incidental note made of an approximately 2.5 cm right-sided Baker's cyst. Electronically Signed   By: Sandi Mariscal M.D.   On: 08/17/2022 14:50   VAS [place] ABI WITH/WO TBI  Result Date: 07/31/2022  LOWER EXTREMITY DOPPLER STUDY Patient Name:  SEAIRA BYUS  Date of Exam:   07/27/2022 Medical Rec #: [id number]           Accession #:    [id number] Date of Birth: Jul 11, 1941  Patient Gender: F Patient Age:   62 years Exam Location:  [facility] Procedure:      VAS [place] ABI WITH/WO TBI Referring Phys: [name]  --------------------------------------------------------------------------------  Indications: Rest pain. Wounds  Performing Technologist: Almira Coaster RVS  Examination Guidelines: A complete evaluation includes at minimum, Doppler waveform signals and systolic blood pressure reading at the level of bilateral brachial, anterior tibial, and posterior tibial arteries, when vessel segments are accessible. Bilateral testing is considered an integral part of a complete examination. Photoelectric Plethysmograph (PPG) waveforms and toe systolic pressure readings are included as required and additional duplex testing as needed. Limited examinations for reoccurring indications may be performed as noted.  ABI Findings: +---------+------------------+-----+---------+--------+ Right    Rt Pressure (mmHg)IndexWaveform Comment  +---------+------------------+-----+---------+--------+ ATA      156               1.30 triphasic         +---------+------------------+-----+---------+--------+ PTA      141               1.18 triphasic         +---------+------------------+-----+---------+--------+ Great Toe135               1.12 Normal            +---------+------------------+-----+---------+--------+ +---------+------------------+-----+---------+-------+ Left     Lt Pressure (mmHg)IndexWaveform Comment +---------+------------------+-----+---------+-------+ Brachial 120                                     +---------+------------------+-----+---------+-------+ ATA      140               1.17 triphasic        +---------+------------------+-----+---------+-------+ PTA      152               1.27 triphasic        +---------+------------------+-----+---------+-------+ Great Toe145               1.21 Normal           +---------+------------------+-----+---------+-------+ +-------+-----------+-----------+------------+------------+ ABI/TBIToday's ABIToday's TBIPrevious ABIPrevious  TBI +-------+-----------+-----------+------------+------------+ Right  1.30       1.12                                +-------+-----------+-----------+------------+------------+ Left   1.27       1.21                                +-------+-----------+-----------+------------+------------+  Summary: Right: Resting right ankle-brachial index is within normal range. The right toe-brachial index is normal. Left: Resting left ankle-brachial index is within normal range. The left toe-brachial index is normal. *See table(s) above for measurements and observations.  Electronically signed by [name] MD on 07/31/2022 at 1:19:59 PM.    Final     Microbiology: Results for orders placed or performed during the hospital encounter of 08/22/22  Blood Culture (routine x 2)     Status: None (Preliminary result)   Collection Time: 08/22/22  7:40 AM   Specimen: BLOOD  Result Value Ref Range Status   Specimen Description BLOOD BLOOD LEFT FOREARM  Final   Special Requests   Final    BOTTLES DRAWN AEROBIC AND ANAEROBIC Blood Culture results may not be optimal due to an inadequate  volume of blood received in culture bottles   Culture   Final    NO GROWTH 3 DAYS Performed at [facility], [address] [place], [place] [address]    Report Status PENDING  Incomplete  Blood Culture (routine x 2)     Status: None (Preliminary result)   Collection Time: 08/22/22  7:40 AM   Specimen: BLOOD  Result Value Ref Range Status   Specimen Description BLOOD RIGHT ANTECUBITAL  Final   Special Requests   Final    BOTTLES DRAWN AEROBIC AND ANAEROBIC Blood Culture adequate volume   Culture   Final    NO GROWTH 3 DAYS Performed at [facility], [address] [place], [place] [address]    Report Status PENDING  Incomplete  Resp Panel by RT-PCR (Flu A&B, Covid) Anterior Nasal Swab     Status: None   Collection Time: 08/22/22  8:00 AM   Specimen: Anterior Nasal Swab  Result Value Ref Range  Status   SARS Coronavirus 2 by RT PCR NEGATIVE NEGATIVE Final    Comment: (NOTE) SARS-CoV-2 target nucleic acids are NOT DETECTED.  The SARS-CoV-2 RNA is generally detectable in upper respiratory specimens during the acute phase of infection. The lowest concentration of SARS-CoV-2 viral copies this assay can detect is 138 copies/mL. A negative result does not preclude SARS-Cov-2 infection and should not be used as the sole basis for treatment or other patient management decisions. A negative result may occur with  improper specimen collection/handling, submission of specimen other than nasopharyngeal swab, presence of viral mutation(s) within the areas targeted by this assay, and inadequate number of viral copies(<138 copies/mL). A negative result must be combined with clinical observations, patient history, and epidemiological information. The expected result is Negative.  Fact Sheet for Patients:  EntrepreneurPulse.[employer].au  Fact Sheet for Healthcare Providers:  IncredibleEmployment.be  This test is no t yet approved or cleared by the [place] FDA and  has been authorized for detection and/or diagnosis of SARS-CoV-2 by FDA under an Emergency Use Authorization (EUA). This EUA will remain  in effect (meaning this test can be used) for the duration of the COVID-19 declaration under Section 564(b)(1) of the Act, 21 U.S.C.section 360bbb-3(b)(1), unless the authorization is terminated  or revoked sooner.       Influenza A by PCR NEGATIVE NEGATIVE Final   Influenza B by PCR NEGATIVE NEGATIVE Final    Comment: (NOTE) The Xpert Xpress SARS-CoV-2/FLU/RSV plus assay is intended as an aid in the diagnosis of influenza from Nasopharyngeal swab specimens and should not be used as a sole basis for treatment. Nasal washings and aspirates are unacceptable for Xpert Xpress SARS-CoV-2/FLU/RSV testing.  Fact Sheet for  Patients: EntrepreneurPulse.com.au  Fact Sheet for Healthcare Providers: IncredibleEmployment.be  This test is not yet approved or cleared by the [place] FDA and has been authorized for detection and/or diagnosis of SARS-CoV-2 by FDA under an Emergency Use Authorization (EUA). This EUA will remain in effect (meaning this test can be used) for the duration of the COVID-19 declaration under Section 564(b)(1) of the Act, 21 U.S.C. section 360bbb-3(b)(1), unless the authorization is terminated or revoked.  Performed at [facility], [address] [place], [place] [address]   Urine Culture     Status: None   Collection Time: 08/22/22  8:53 AM   Specimen: In/Out Cath Urine  Result Value Ref Range Status   Specimen Description   Final    IN/OUT CATH URINE Performed at [facility]  [facility], [address] [place], [place] [address]    Special Requests   Final    NONE Performed at [facility], [address] [place], [place] [address]    Culture   Final    NO GROWTH Performed at [facility], [place] [address] [place], [place] [address]    Report Status 08/23/2022 FINAL  Final  C Difficile Quick Screen w PCR reflex     Status: None   Collection Time: 08/22/22  8:53 AM   Specimen: STOOL  Result Value Ref Range Status   C Diff antigen NEGATIVE NEGATIVE Final   C Diff toxin NEGATIVE NEGATIVE Final   C Diff interpretation No C. difficile detected.  Final    Comment: Performed at [facility], [address] [place], [place] [address]    Labs: CBC: Recent Labs  Lab 08/21/22 1247 08/22/22 0740 08/23/22 0459 08/24/22 0413 08/25/22 0628  WBC 11.0* 15.6* 9.1 8.4 7.7  NEUTROABS 9.4* 14.1*  --   --  5.0  HGB 9.6* 9.2* 7.3* 7.9* 8.3*  HCT 32.3* 31.7* 24.6* 26.1* 27.1*  MCV 87.3 88.8 87.9 86.1 83.9  PLT 390 375 290 314 128   Basic Metabolic Panel: Recent Labs  Lab 08/21/22 1247 08/22/22 0740  08/22/22 0838 08/23/22 0459 08/24/22 0413 08/25/22 0628  NA 140 140  --  140  --  140  K 4.0 3.4*  --  3.4* 4.3 3.7  CL 107 112*  --  109  --  110  CO2 19* 18*  --  23  --  23  GLUCOSE 99 134*  --  95  --  125*  BUN 13 11  --  10  --  7*  CREATININE 0.95 0.99  --  0.83  --  0.62  CALCIUM 9.6 8.0*  --  7.9*  --  8.6*  MG  --   --  1.4* 2.1 1.8 1.7  PHOS  --   --   --   --   --  2.4*   Liver Function Tests: Recent Labs  Lab 08/22/22 0740  AST 31  ALT 11  ALKPHOS 47  BILITOT 0.7  PROT 6.7  ALBUMIN 3.1*   CBG: Recent Labs  Lab 08/23/22 1410 08/23/22 2049 08/24/22 0833 08/24/22 1159 08/25/22 0749  GLUCAP 76 92 101* 106* 135*    Discharge time spent: greater than 30 minutes.  Signed: Jennye Boroughs, MD [facility] 08/25/2022

## 2022-08-25 NOTE — Plan of Care (Signed)

## 2022-08-27 LAB — CULTURE, BLOOD (ROUTINE X 2)
Culture: NO GROWTH
Culture: NO GROWTH
Special Requests: ADEQUATE

## 2022-08-28 ENCOUNTER — Ambulatory Visit: Payer: PPO | Admitting: Physician Assistant

## 2022-09-04 ENCOUNTER — Ambulatory Visit: Payer: PPO | Admitting: Physician Assistant

## 2022-09-06 ENCOUNTER — Emergency Department
Admission: EM | Admit: 2022-09-06 | Discharge: 2022-09-07 | Disposition: A | Discharge status: Home / Self Care | Payer: PPO | Attending: Emergency Medicine | Admitting: Emergency Medicine

## 2022-09-06 ENCOUNTER — Encounter: Payer: Self-pay | Admitting: Emergency Medicine

## 2022-09-06 DIAGNOSIS — R638 Other symptoms and signs concerning food and fluid intake: Secondary | ICD-10-CM | POA: Insufficient documentation

## 2022-09-06 DIAGNOSIS — R339 Retention of urine, unspecified: Secondary | ICD-10-CM | POA: Insufficient documentation

## 2022-09-06 DIAGNOSIS — E119 Type 2 diabetes mellitus without complications: Secondary | ICD-10-CM | POA: Insufficient documentation

## 2022-09-06 DIAGNOSIS — N390 Urinary tract infection, site not specified: Secondary | ICD-10-CM | POA: Diagnosis not present

## 2022-09-06 DIAGNOSIS — I10 Essential (primary) hypertension: Secondary | ICD-10-CM | POA: Insufficient documentation

## 2022-09-06 MED ORDER — SODIUM CHLORIDE 0.9 % IV BOLUS (SEPSIS)
500.0000 mL | Freq: Once | INTRAVENOUS | Status: AC
  Administered 2022-09-07: 500 mL via INTRAVENOUS
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
## 2022-09-06 NOTE — ED Provider Notes (Signed)
[facility] Provider Note  Patient Contact: 10:51 PM (approximate)   History   Urinary Retention   HPI  [name] is a 81 y.o. female with a history of hypertension, hyperlipidemia, diabetes, gout, diverticulitis, cellulitis, presents to the emergency department with possible obstruction of patient's Foley catheter.  According to patient's husband, he noticed reduced output today.  Patient has seemed at her baseline since being discharged from the hospital on 08/22/2022.  Patient was discharged with a Foley catheter in place which was supposed to be evaluated by urology in 1 week.  Patient was unable to make her appointment.  She had no vomiting, diarrhea, fever, chills, cough or complaints of abdominal pain at home.  No chest pain, chest tightness or shortness of breath.      Physical Exam   Triage Vital Signs: ED Triage Vitals  Enc Vitals Group     BP 09/06/22 2049 (!) 153/75     Pulse Rate 09/06/22 2049 73     Resp 09/06/22 2049 16     Temp 09/06/22 2049 98.1 F (36.7 C)     Temp Source 09/06/22 2049 Oral     SpO2 09/06/22 2040 98 %     Weight 09/06/22 2050 121 lb 0.5 oz (54.9 kg)     Height 09/06/22 2050 '5\' 3"'$  (1.6 m)     Head Circumference --      Peak Flow --      Pain Score 09/06/22 2050 6     Pain Loc --      Pain Edu? --      Excl. in [place]? --     Most recent vital signs: Vitals:   09/06/22 2040 09/06/22 2049  BP:  (!) 153/75  Pulse:  73  Resp:  16  Temp:  98.1 F (36.7 C)  SpO2: 98% 98%     General: Alert and in no acute distress. Eyes:  PERRL. EOMI. Head: No acute traumatic findings ENT:      Nose: No congestion/rhinnorhea.      Mouth/Throat: Mucous membranes are moist.  Neck: No stridor. No cervical spine tenderness to palpation. Cardiovascular:  Good peripheral perfusion Respiratory: Normal respiratory effort without tachypnea or retractions. Lungs CTAB. Good air entry to the bases with no decreased or absent  breath sounds. Gastrointestinal: Bowel sounds 4 quadrants. Soft and nontender to palpation. No guarding or rigidity. No palpable masses. No distention. No CVA tenderness. Musculoskeletal: Full range of motion to all extremities.  Neurologic:  No gross focal neurologic deficits are appreciated.  Skin:   No rash noted    ED Results / Procedures / Treatments   Labs (all labs ordered are listed, but only abnormal results are displayed) Labs Reviewed  URINALYSIS, ROUTINE W REFLEX MICROSCOPIC  CBC WITH DIFFERENTIAL/PLATELET  COMPREHENSIVE METABOLIC PANEL  LIPASE, BLOOD  MAGNESIUM         PROCEDURES:  Critical Care performed: No  Procedures   MEDICATIONS ORDERED IN ED: Medications  sodium chloride 0.9 % bolus 500 mL (has no administration in time range)     IMPRESSION / MDM / ASSESSMENT AND PLAN / ED COURSE  I reviewed the triage vital signs and the nursing notes.                              Assessment and plan:  Diminished appetite Urinary retention 81 year old female presents to the emergency department with reduced appetite, urinary retention  and some generalized malaise at home.  Patient was hypertensive at triage but vital signs are otherwise reassuring.  On exam, patient was alert and nontoxic appearing.  Given multiple recent admissions, I initiated work-up with basic labs and Foley catheter irrigation.  Patient was transitioned to main side of the emergency department under the care of attending Dr. Leonides Schanz.     FINAL CLINICAL IMPRESSION(S) / ED DIAGNOSES   Final diagnoses:  Urinary retention     Rx / DC Orders   ED Discharge Orders     None        Note:  This document was prepared using Dragon voice recognition software and may include unintentional dictation errors.   Vallarie Mare [place], PA-C 09/06/22 2333    Naaman Plummer, MD 09/16/22 [phone]

## 2022-09-06 NOTE — ED Triage Notes (Addendum)
EMS brings pt in from [facility]; has foley cath in place from last wk--decreased output noted today (71m)  despite PO fluids

## 2022-09-06 NOTE — ED Triage Notes (Signed)
Pt presents via EMS from [facility] with complaints of urinary retention starting today. Pt was seen here and had a foley placed on 10/11 upon D/C and she states having a decrease in urinary output today but has had adequate intake. Denies fevers, chills, CP or SOB.   Bladder scan 644m

## 2022-09-07 DIAGNOSIS — Z7401 Bed confinement status: Secondary | ICD-10-CM | POA: Diagnosis not present

## 2022-09-07 DIAGNOSIS — R531 Weakness: Secondary | ICD-10-CM | POA: Diagnosis not present

## 2022-09-07 LAB — URINALYSIS, ROUTINE W REFLEX MICROSCOPIC
Bilirubin Urine: NEGATIVE
Glucose, UA: NEGATIVE mg/dL
Ketones, ur: NEGATIVE mg/dL
Nitrite: POSITIVE — AB
Protein, ur: NEGATIVE mg/dL
Specific Gravity, Urine: 1.009 (ref 1.005–1.030)
WBC, UA: >50 WBC/hpf — ABNORMAL HIGH (ref 0–5)
pH: 5 (ref 5.0–8.0)

## 2022-09-07 LAB — CBC WITH DIFFERENTIAL/PLATELET
Abs Immature Granulocytes: 0.11 10*3/uL — ABNORMAL HIGH (ref 0.00–0.07)
Basophils Absolute: 0.1 10*3/uL (ref 0.0–0.1)
Basophils Relative: 0 %
Eosinophils Absolute: 0.1 10*3/uL (ref 0.0–0.5)
Eosinophils Relative: 0 %
HCT: 31.8 % — ABNORMAL LOW (ref 36.0–46.0)
Hemoglobin: 9.7 g/dL — ABNORMAL LOW (ref 12.0–15.0)
Immature Granulocytes: 1 %
Lymphocytes Relative: 8 %
Lymphs Abs: 1.2 10*3/uL (ref 0.7–4.0)
MCH: 25.3 pg — ABNORMAL LOW (ref 26.0–34.0)
MCHC: 30.5 g/dL (ref 30.0–36.0)
MCV: 83 fL (ref 80.0–100.0)
Monocytes Absolute: 0.3 10*3/uL (ref 0.1–1.0)
Monocytes Relative: 2 %
Neutro Abs: 13.9 10*3/uL — ABNORMAL HIGH (ref 1.7–7.7)
Neutrophils Relative %: 89 %
Platelets: 528 10*3/uL — ABNORMAL HIGH (ref 150–400)
RBC: 3.83 MIL/uL — ABNORMAL LOW (ref 3.87–5.11)
RDW: 19 % — ABNORMAL HIGH (ref 11.5–15.5)
WBC: 15.6 10*3/uL — ABNORMAL HIGH (ref 4.0–10.5)
nRBC: 0 % (ref 0.0–0.2)

## 2022-09-07 LAB — COMPREHENSIVE METABOLIC PANEL
ALT: 10 U/L (ref 0–44)
AST: 22 U/L (ref 15–41)
Albumin: 2.8 g/dL — ABNORMAL LOW (ref 3.5–5.0)
Alkaline Phosphatase: 55 U/L (ref 38–126)
Anion gap: 13 (ref 5–15)
BUN: 21 mg/dL (ref 8–23)
CO2: 21 mmol/L — ABNORMAL LOW (ref 22–32)
Calcium: 9.1 mg/dL (ref 8.9–10.3)
Chloride: 102 mmol/L (ref 98–111)
Creatinine, Ser: 0.9 mg/dL (ref 0.44–1.00)
GFR, Estimated: >60 mL/min (ref >60)
Glucose, Bld: 127 mg/dL — ABNORMAL HIGH (ref 70–99)
Potassium: 3.2 mmol/L — ABNORMAL LOW (ref 3.5–5.1)
Sodium: 136 mmol/L (ref 135–145)
Total Bilirubin: 0.4 mg/dL (ref 0.3–1.2)
Total Protein: 7.1 g/dL (ref 6.5–8.1)

## 2022-09-07 LAB — LIPASE, BLOOD: Lipase: 30 U/L (ref 11–51)

## 2022-09-07 LAB — MAGNESIUM: Magnesium: 1.8 mg/dL (ref 1.7–2.4)

## 2022-09-07 MED ORDER — CIPROFLOXACIN HCL 500 MG PO TABS: 500.0000 mg | ORAL_TABLET | Freq: Two times a day (BID) | ORAL | 0 refills | Status: AC

## 2022-09-07 MED ORDER — CIPROFLOXACIN IN D5W 400 MG/200ML IV SOLN
400.0000 mg | Freq: Once | INTRAVENOUS | Status: AC
  Administered 2022-09-07: 400 mg via INTRAVENOUS
  Filled 2022-09-07: qty 200

## 2022-09-07 NOTE — ED Notes (Signed)
Pt's genitals looked swollen and redden like she is not staying dry. Pt did have meplex on sacral area. Pt cleaned of Bm, foley placed, and pt put in clean depends

## 2022-09-07 NOTE — ED Notes (Signed)
Pt noted to have 642m urine in Foley drainage bag. Repeat bladder scan obtained showing apx 6112m

## 2022-09-07 NOTE — ED Notes (Signed)
Bed bug found on pt. Bug trapped in container and given to charge

## 2022-09-09 LAB — URINE CULTURE: Culture: >=100000 — AB

## 2022-09-11 ENCOUNTER — Ambulatory Visit: Payer: PPO | Admitting: Physician Assistant

## 2022-09-14 DIAGNOSIS — M1712 Unilateral primary osteoarthritis, left knee: Secondary | ICD-10-CM | POA: Diagnosis not present

## 2022-09-14 DIAGNOSIS — R2242 Localized swelling, mass and lump, left lower limb: Secondary | ICD-10-CM | POA: Diagnosis not present

## 2022-09-24 DIAGNOSIS — E876 Hypokalemia: Secondary | ICD-10-CM | POA: Diagnosis not present

## 2022-09-24 DIAGNOSIS — Z853 Personal history of malignant neoplasm of breast: Secondary | ICD-10-CM | POA: Diagnosis not present

## 2022-09-24 DIAGNOSIS — G8929 Other chronic pain: Secondary | ICD-10-CM | POA: Diagnosis not present

## 2022-09-24 DIAGNOSIS — R634 Abnormal weight loss: Secondary | ICD-10-CM | POA: Diagnosis not present

## 2022-09-24 DIAGNOSIS — E1165 Type 2 diabetes mellitus with hyperglycemia: Secondary | ICD-10-CM | POA: Diagnosis not present

## 2022-09-24 DIAGNOSIS — D649 Anemia, unspecified: Secondary | ICD-10-CM | POA: Diagnosis not present

## 2022-09-24 DIAGNOSIS — L89150 Pressure ulcer of sacral region, unstageable: Secondary | ICD-10-CM | POA: Diagnosis not present

## 2022-09-24 DIAGNOSIS — N320 Bladder-neck obstruction: Secondary | ICD-10-CM | POA: Diagnosis not present

## 2022-09-24 DIAGNOSIS — Z09 Encounter for follow-up examination after completed treatment for conditions other than malignant neoplasm: Secondary | ICD-10-CM | POA: Diagnosis not present

## 2022-09-24 DIAGNOSIS — M25562 Pain in left knee: Secondary | ICD-10-CM | POA: Diagnosis not present

## 2022-09-24 DIAGNOSIS — R531 Weakness: Secondary | ICD-10-CM | POA: Diagnosis not present

## 2022-09-24 DIAGNOSIS — Z2821 Immunization not carried out because of patient refusal: Secondary | ICD-10-CM | POA: Diagnosis not present

## 2022-09-24 DIAGNOSIS — Z79899 Other long term (current) drug therapy: Secondary | ICD-10-CM | POA: Diagnosis not present

## 2022-09-24 DIAGNOSIS — E8809 Other disorders of plasma-protein metabolism, not elsewhere classified: Secondary | ICD-10-CM | POA: Diagnosis not present

## 2022-09-24 DIAGNOSIS — M25561 Pain in right knee: Secondary | ICD-10-CM | POA: Diagnosis not present

## 2022-09-26 ENCOUNTER — Encounter: Payer: PPO | Attending: Internal Medicine | Admitting: Internal Medicine

## 2022-09-26 DIAGNOSIS — I87312 Chronic venous hypertension (idiopathic) with ulcer of left lower extremity: Secondary | ICD-10-CM | POA: Diagnosis not present

## 2022-09-26 DIAGNOSIS — Z853 Personal history of malignant neoplasm of breast: Secondary | ICD-10-CM | POA: Diagnosis not present

## 2022-09-26 DIAGNOSIS — I5032 Chronic diastolic (congestive) heart failure: Secondary | ICD-10-CM | POA: Insufficient documentation

## 2022-09-26 DIAGNOSIS — E1151 Type 2 diabetes mellitus with diabetic peripheral angiopathy without gangrene: Secondary | ICD-10-CM | POA: Insufficient documentation

## 2022-09-26 DIAGNOSIS — E785 Hyperlipidemia, unspecified: Secondary | ICD-10-CM | POA: Diagnosis not present

## 2022-09-26 DIAGNOSIS — L89320 Pressure ulcer of left buttock, unstageable: Secondary | ICD-10-CM | POA: Insufficient documentation

## 2022-09-26 DIAGNOSIS — I110 Hypertensive heart disease with heart failure: Secondary | ICD-10-CM | POA: Diagnosis not present

## 2022-09-26 DIAGNOSIS — L97822 Non-pressure chronic ulcer of other part of left lower leg with fat layer exposed: Secondary | ICD-10-CM | POA: Insufficient documentation

## 2022-09-26 DIAGNOSIS — I890 Lymphedema, not elsewhere classified: Secondary | ICD-10-CM | POA: Diagnosis not present

## 2022-09-26 DIAGNOSIS — E11622 Type 2 diabetes mellitus with other skin ulcer: Secondary | ICD-10-CM | POA: Diagnosis not present

## 2022-09-26 DIAGNOSIS — L89610 Pressure ulcer of right heel, unstageable: Secondary | ICD-10-CM | POA: Diagnosis not present

## 2022-09-26 DIAGNOSIS — E1140 Type 2 diabetes mellitus with diabetic neuropathy, unspecified: Secondary | ICD-10-CM | POA: Insufficient documentation

## 2022-09-26 DIAGNOSIS — Z9011 Acquired absence of right breast and nipple: Secondary | ICD-10-CM | POA: Insufficient documentation

## 2022-09-27 NOTE — Progress Notes (Signed)
[name], [name] ([id number]) [phone] [email or web address] Page 1 of 5 Visit Report for 09/26/2022 Abuse Risk Screen Details Patient Name: Date of Service: [facility], DIA [name]. 09/26/2022 2:45 PM Medical Record Number: [id number] Patient Account Number: [id number] Date of Birth/Sex: Treating RN: 05-27-1941 (81 y.o. [name] Primary Care Machele Deihl: Tracie Harrier Other Clinician: Referring Keisa Blow: Treating Garnet Overfield/Extender: Kalman Shan Self, Referral Weeks in Treatment: 0 Abuse Risk Screen Items Answer ABUSE RISK SCREEN: Has anyone close to you tried to hurt or harm you recentlyo No Do you feel uncomfortable with anyone in your familyo No Has anyone forced you do things that you didnt want to doo No Electronic Signature(s) Signed: 09/26/2022 5:08:09 PM By: Rosalio Loud MSN RN CNS WTA Entered By: Rosalio Loud on 09/26/2022 15:44:20 -------------------------------------------------------------------------------- Activities of Daily Living Details Patient Name: Date of Service: [facility], DIA [name]. 09/26/2022 2:45 PM Medical Record Number: [id number] Patient Account Number: [id number] Date of Birth/Sex: Treating RN: 1941-02-28 (81 y.o. [name] Primary Care Oni Dietzman: Tracie Harrier Other Clinician: Referring Miliano Cotten: Treating Kena Limon/Extender: Kalman Shan Self, Referral Weeks in Treatment: 0 Activities of Daily Living Items Answer Activities of Daily Living (Please select one for each item) Drive Automobile Not Able T Medications ake Need Assistance Use T elephone Completely Able Care for Appearance Need Assistance Use T oilet Need Assistance Bath / Shower Need Assistance Dress Self Need Assistance Feed Self Completely Able Walk Not Able Get In / Out Bed Need Assistance Housework Not Able [name], [name] ([id number]) [phone] [email or web address] Page 2 of 5 Prepare Meals Not Able Handle Money Not  Able Shop for Self Not Able Electronic Signature(s) Signed: 09/26/2022 5:08:09 PM By: Rosalio Loud MSN RN CNS WTA Entered By: Rosalio Loud on 09/26/2022 15:45:18 -------------------------------------------------------------------------------- Education Screening Details Patient Name: Date of Service: [facility], DIA [name]. 09/26/2022 2:45 PM Medical Record Number: [id number] Patient Account Number: [id number] Date of Birth/Sex: Treating RN: Jun 24, 1941 (81 y.o. [name] Primary Care Leandria Thier: Tracie Harrier Other Clinician: Referring Alison Breeding: Treating Daejah Klebba/Extender: Kalman Shan Self, Referral Weeks in Treatment: 0 Learning Preferences/Education Level/Primary Language Learning Preference: Explanation Highest Education Level: High School Preferred Language: English Cognitive Barrier Language Barrier: No Translator Needed: No Memory Deficit: No Emotional Barrier: No Cultural/Religious Beliefs Affecting Medical Care: No Physical Barrier Impaired Vision: No Impaired Hearing: No Decreased Hand dexterity: No Knowledge/Comprehension Knowledge Level: Medium Comprehension Level: Medium Ability to understand written instructions: High Ability to understand verbal instructions: High Motivation Anxiety Level: Calm Cooperation: Cooperative Education Importance: Acknowledges Need Interest in Health Problems: Asks Questions Perception: Coherent Willingness to Engage in Self-Management Medium Activities: Readiness to Engage in Self-Management Medium Activities: Electronic Signature(s) Signed: 09/26/2022 5:08:09 PM By: Rosalio Loud MSN RN CNS WTA Entered By: Rosalio Loud on 09/26/2022 15:45:57 Kihn, [name] ([id number]) [id number] Page 3 of 5 -------------------------------------------------------------------------------- Fall Risk Assessment Details Patient Name: Date of Service: [facility], DIA [name]. 09/26/2022 2:45  PM Medical Record Number: [id number] Patient Account Number: [id number] Date of Birth/Sex: Treating RN: Jul 06, 1941 (81 y.o. [name] Primary Care Neil Errickson: Tracie Harrier Other Clinician: Referring Zilah Villaflor: Treating [name]/Extender: Kalman Shan Self, Referral Weeks in Treatment: 0 Fall Risk Assessment Items Have you had 2 or more falls in the last 12 monthso 0 No Have you had any fall that resulted in injury in the last 12 monthso 0 No FALLS RISK SCREEN History of falling - immediate or within 3 months 0 No Secondary diagnosis (Do you have 2 or more medical diagnoseso) 0 No  Ambulatory aid None/bed rest/wheelchair/nurse 0 Yes Crutches/cane/walker 0 No Furniture 0 No Intravenous therapy Access/Saline/Heparin Lock 0 No Gait/Transferring Normal/ bed rest/ wheelchair 0 Yes Weak (short steps with or without shuffle, stooped but able to lift head while walking, may seek 10 Yes support from furniture) Impaired (short steps with shuffle, may have difficulty arising from chair, head down, impaired 20 Yes balance) Mental Status Oriented to own ability 0 Yes Electronic Signature(s) Signed: 09/26/2022 5:08:09 PM By: Rosalio Loud MSN RN CNS WTA Entered By: Rosalio Loud on 09/26/2022 15:46:40 -------------------------------------------------------------------------------- Foot Assessment Details Patient Name: Date of Service: [facility], DIA [name]. 09/26/2022 2:45 PM Medical Record Number: [id number] Patient Account Number: [id number] Date of Birth/Sex: Treating RN: Mar 04, 1941 (81 y.o. [name] Primary Care Zamora Colton: Tracie Harrier Other Clinician: Referring Anairis Knick: Treating [name]/Extender: Kalman Shan Self, Referral Weeks in Treatment: 0 Foot Assessment Items Site Locations [name], [name] ([id number]) [phone] [id number] Page 4 of 5 + = Sensation present, - = Sensation absent, C = Callus, U = Ulcer R = Redness, W =  Warmth, M = Maceration, PU = Pre-ulcerative lesion F = Fissure, S = Swelling, D = Dryness Assessment Right: Left: Other Deformity: No No Prior Foot Ulcer: No Yes Prior Amputation: No No Charcot Joint: No No Ambulatory Status: Non-ambulatory Assistance Device: Wheelchair Gait: Administrator, arts) Signed: 09/26/2022 5:08:09 PM By: Rosalio Loud MSN RN CNS WTA Entered By: Rosalio Loud on 09/26/2022 15:49:27 -------------------------------------------------------------------------------- Nutrition Risk Screening Details Patient Name: Date of Service: [facility], DIA [name]. 09/26/2022 2:45 PM Medical Record Number: [id number] Patient Account Number: [id number] Date of Birth/Sex: Treating RN: [birth year] (81 y.o. [name] Primary Care Navi Erber: Tracie Harrier Other Clinician: Referring Taniaya Rudder: Treating Cartrell Bentsen/Extender: Kalman Shan Self, Referral Weeks in Treatment: 0 Height (in): 59 Weight (lbs): 116 Body Mass Index (BMI): 23.4 Nutrition Risk Screening Items Score Screening NUTRITION RISK SCREEN: I have an illness or condition that made me change the kind and/or amount of food I eat 0 No I eat fewer than two meals per day 0 No I eat few fruits and vegetables, or milk products 0 No I have three or more drinks of beer, liquor or wine almost every day 0 No I have tooth or mouth problems that make it hard for me to eat 0 No [place], [name] ([id number]) [id number] Page 5 of 5 I don't always have enough money to buy the food I need 0 No I eat alone most of the time 0 No I take three or more different prescribed or over-the-counter drugs a day 0 No Without wanting to, I have lost or gained 10 pounds in the last six months 0 No I am not always physically able to shop, cook and/or feed myself 2 Yes Nutrition Protocols Good Risk Protocol 0 No interventions needed Moderate Risk Protocol High Risk Proctocol Risk Level:  Good Risk Score: 2 Electronic Signature(s) Signed: 09/26/2022 5:08:09 PM By: Rosalio Loud MSN RN CNS WTA Entered By: Rosalio Loud on 09/26/2022 15:47:04

## 2022-09-28 DIAGNOSIS — E1151 Type 2 diabetes mellitus with diabetic peripheral angiopathy without gangrene: Secondary | ICD-10-CM | POA: Diagnosis not present

## 2022-09-28 DIAGNOSIS — M5410 Radiculopathy, site unspecified: Secondary | ICD-10-CM | POA: Diagnosis not present

## 2022-09-28 DIAGNOSIS — E785 Hyperlipidemia, unspecified: Secondary | ICD-10-CM | POA: Diagnosis not present

## 2022-09-28 DIAGNOSIS — K5732 Diverticulitis of large intestine without perforation or abscess without bleeding: Secondary | ICD-10-CM | POA: Diagnosis not present

## 2022-09-28 DIAGNOSIS — L97829 Non-pressure chronic ulcer of other part of left lower leg with unspecified severity: Secondary | ICD-10-CM | POA: Diagnosis not present

## 2022-09-28 DIAGNOSIS — G8929 Other chronic pain: Secondary | ICD-10-CM | POA: Diagnosis not present

## 2022-09-28 DIAGNOSIS — I890 Lymphedema, not elsewhere classified: Secondary | ICD-10-CM | POA: Diagnosis not present

## 2022-09-28 DIAGNOSIS — M792 Neuralgia and neuritis, unspecified: Secondary | ICD-10-CM | POA: Diagnosis not present

## 2022-09-28 DIAGNOSIS — M1A9XX0 Chronic gout, unspecified, without tophus (tophi): Secondary | ICD-10-CM | POA: Diagnosis not present

## 2022-09-28 DIAGNOSIS — L97519 Non-pressure chronic ulcer of other part of right foot with unspecified severity: Secondary | ICD-10-CM | POA: Diagnosis not present

## 2022-09-28 DIAGNOSIS — I700 Atherosclerosis of aorta: Secondary | ICD-10-CM | POA: Diagnosis not present

## 2022-09-28 DIAGNOSIS — M25561 Pain in right knee: Secondary | ICD-10-CM | POA: Diagnosis not present

## 2022-09-28 DIAGNOSIS — I110 Hypertensive heart disease with heart failure: Secondary | ICD-10-CM | POA: Diagnosis not present

## 2022-09-28 DIAGNOSIS — L89323 Pressure ulcer of left buttock, stage 3: Secondary | ICD-10-CM | POA: Diagnosis not present

## 2022-09-28 DIAGNOSIS — M19049 Primary osteoarthritis, unspecified hand: Secondary | ICD-10-CM | POA: Diagnosis not present

## 2022-09-28 DIAGNOSIS — A419 Sepsis, unspecified organism: Secondary | ICD-10-CM | POA: Diagnosis not present

## 2022-09-28 DIAGNOSIS — L03115 Cellulitis of right lower limb: Secondary | ICD-10-CM | POA: Diagnosis not present

## 2022-09-28 DIAGNOSIS — M47812 Spondylosis without myelopathy or radiculopathy, cervical region: Secondary | ICD-10-CM | POA: Diagnosis not present

## 2022-09-28 DIAGNOSIS — N1330 Unspecified hydronephrosis: Secondary | ICD-10-CM | POA: Diagnosis not present

## 2022-09-28 DIAGNOSIS — L89616 Pressure-induced deep tissue damage of right heel: Secondary | ICD-10-CM | POA: Diagnosis not present

## 2022-09-28 DIAGNOSIS — E1140 Type 2 diabetes mellitus with diabetic neuropathy, unspecified: Secondary | ICD-10-CM | POA: Diagnosis not present

## 2022-09-28 DIAGNOSIS — I87313 Chronic venous hypertension (idiopathic) with ulcer of bilateral lower extremity: Secondary | ICD-10-CM | POA: Diagnosis not present

## 2022-09-28 DIAGNOSIS — L03116 Cellulitis of left lower limb: Secondary | ICD-10-CM | POA: Diagnosis not present

## 2022-09-28 DIAGNOSIS — M1712 Unilateral primary osteoarthritis, left knee: Secondary | ICD-10-CM | POA: Diagnosis not present

## 2022-09-28 DIAGNOSIS — I5032 Chronic diastolic (congestive) heart failure: Secondary | ICD-10-CM | POA: Diagnosis not present

## 2022-09-28 NOTE — Progress Notes (Signed)
MALISHA, MABEY ([id number]) [id number] Page 1 of 11 Visit Report for 09/26/2022 Allergy List Details Patient Name: Date of Service: [facility], [name] 09/26/2022 2:45 PM Medical Record Number: [id number] Patient Account Number: [id number] Date of Birth/Sex: Treating RN: [birth year] (81 y.o. Drema Pry Primary Care Bitha Fauteux: Tracie Harrier Other Clinician: Referring Valery Chance: Treating Tanishi Nault/Extender: Kalman Shan Self, Referral Weeks in Treatment: 0 Allergies Active Allergies Sulfa (Sulfonamide Antibiotics) doxycycline Reaction: severe diarrhea Severity: Severe Active: 08/02/2022 Allergy Notes Electronic Signature(s) Signed: 09/26/2022 5:08:09 PM By: Rosalio Loud MSN RN CNS WTA Entered By: Rosalio Loud on 09/26/2022 15:43:55 -------------------------------------------------------------------------------- Arrival Information Details Patient Name: Date of Service: [facility], [name] 09/26/2022 2:45 PM Medical Record Number: [id number] Patient Account Number: [id number] Date of Birth/Sex: Treating RN: 11/14/1940 (81 y.o. Drema Pry Primary Care Reise Gladney: Tracie Harrier Other Clinician: Referring Echo Propp: Treating Beula Joyner/Extender: Kalman Shan Self, Referral Weeks in Treatment: 0 Visit Information Patient Arrived: Wheel Chair Arrival Time: 15:11 Accompanied By: husband Transfer Assistance: None Patient Identification Verified: Yes Secondary Verification Process Completed: Yes Patient Requires Transmission-Based Precautions: No Patient Has Alerts: Yes Patient Alerts: DM II ABI R 1.30 TBI 1.12 Loper, Macarena L ([id number]) ABI R 1.30 TBI 1.1 ABI L 1.27 TBI 1.2 History Since Last Visit All ordered tests and consults were completed: No Added or deleted any medications: No Any new allergies or adverse reactions: No Had a fall or experienced change in activities of daily living that may affect risk of falls:  No Signs or symptoms of abuse/neglect since last visito No Hospitalized since last visit: No Implantable device outside of the clinic excluding cellular tissue based products placed in the center 2 since last visit: No [id number] Page 2 of 11 1 Pain Present Now: Yes Electronic Signature(s) Signed: 09/26/2022 5:08:09 PM By: Rosalio Loud MSN RN CNS WTA Entered By: Rosalio Loud on 09/26/2022 15:18:58 -------------------------------------------------------------------------------- Clinic Level of Care Assessment Details Patient Name: Date of Service: [facility], [name] 09/26/2022 2:45 PM Medical Record Number: [id number] Patient Account Number: [id number] Date of Birth/Sex: Treating RN: February 20, 1941 (81 y.o. Drema Pry Primary Care Charice Zuno: Tracie Harrier Other Clinician: Referring Clyda Smyth: Treating Brenton Joines/Extender: Kalman Shan Self, Referral Weeks in Treatment: 0 Clinic Level of Care Assessment Items TOOL 1 Quantity Score _0  - 0 Use when EandM and Procedure is performed on INITIAL visit ASSESSMENTS - Nursing Assessment / Reassessment _1  - 0 General Physical Exam (combine w/ comprehensive assessment (listed just below) when performed on new pt. evals) _2  - 0 Comprehensive Assessment (HX, ROS, Risk Assessments, Wounds Hx, etc.) ASSESSMENTS - Wound and Skin Assessment / Reassessment _3  - 0 Dermatologic / Skin Assessment (not related to wound area) ASSESSMENTS - Ostomy and/or Continence Assessment and Care _4  - 0 Incontinence Assessment and Management _5  - 0 Ostomy Care Assessment and Management (repouching, etc.) PROCESS - Coordination of Care _6  - 0 Simple Patient / Family Education for ongoing care _7  - 0 Complex (extensive) Patient / Family Education for ongoing care _8  - 0 Staff obtains Programmer, systems, Records, T Results / Process Orders est _9  - 0 Staff telephones HHA, Nursing Homes / Clarify orders / etc _10  - 0 Routine Transfer to  another Facility (non-emergent condition) _11  - 0 Routine Hospital Admission (non-emergent condition) _12  - 0 New Admissions / Biomedical engineer / Ordering NPWT Apligraf, etc. , _13  - 0 Emergency Hospital Admission (emergent condition) PROCESS - Special Needs _14  - 0 Pediatric / Minor Patient Management _15  - 0 Isolation Patient Management _16  -  0 Hearing / Language / Visual special needs _0  - 0 Assessment of Community assistance (transportation, D/C planning, etc.) _1  - 0 Additional assistance / Altered mentation _2  - 0 Support Surface(s) Assessment (bed, cushion, seat, etc.) Gundrum, Brieana L ([id number]) [id number] Page 3 of 11 INTERVENTIONS - Miscellaneous _3  - 0 External ear exam _4  - 0 Patient Transfer (multiple staff / Civil Service fast streamer / Similar devices) _5  - 0 Simple Staple / Suture removal (25 or less) _6  - 0 Complex Staple / Suture removal (26 or more) _7  - 0 Hypo/Hyperglycemic Management (do not check if billed separately) _8  - 0 Ankle / Brachial Index (ABI) - do not check if billed separately Has the patient been seen at the hospital within the last three years: Yes Total Score: 0 Level Of Care: ____ Electronic Signature(s) Signed: 09/26/2022 5:08:09 PM By: Rosalio Loud MSN RN CNS WTA Entered By: Rosalio Loud on 09/26/2022 17:03:18 -------------------------------------------------------------------------------- Encounter Discharge Information Details Patient Name: Date of Service: [facility], [name] 09/26/2022 2:45 PM Medical Record Number: [id number] Patient Account Number: [id number] Date of Birth/Sex: Treating RN: 1941-08-05 (81 y.o. Drema Pry Primary Care Reine Bristow: Tracie Harrier Other Clinician: Referring Reda Citron: Treating Atharva Mirsky/Extender: Kalman Shan Self, Referral Weeks in Treatment: 0 Encounter Discharge Information Items Post Procedure Vitals Discharge Condition: Stable Temperature (F):  98.4 Ambulatory Status: Wheelchair Pulse (bpm): 76 Discharge Destination: Home Respiratory Rate (breaths/min): 16 Transportation: Private Auto Blood Pressure (mmHg): 145/81 Accompanied By: husband Schedule Follow-up Appointment: Yes Clinical Summary of Care: Electronic Signature(s) Signed: 09/26/2022 5:06:23 PM By: Rosalio Loud MSN RN CNS WTA Entered By: Rosalio Loud on 09/26/2022 17:06:22 -------------------------------------------------------------------------------- Lower Extremity Assessment Details Patient Name: Date of Service: [facility], [name] 09/26/2022 2:45 PM Medical Record Number: [id number] Patient Account Number: [id number] Date of Birth/Sex: Treating RN: 1941/04/01 (81 y.o. Drema Pry Primary Care Ayesha Markwell: Tracie Harrier Other Clinician: MYRAKLE, WINGLER ([id number]) [id number] Page 4 of 11 Referring Carrel Leather: Treating Vedanth Sirico/Extender: Kalman Shan Self, Referral Weeks in Treatment: 0 Edema Assessment Assessed: [Left: No] [Right: No] Edema: [Left: No] [Right: No] Calf Left: Right: Point of Measurement: 31 cm From Medial Instep 31.5 cm 31 cm Ankle Left: Right: Point of Measurement: 11 cm From Medial Instep 24 cm 20 cm Vascular Assessment Pulses: Dorsalis Pedis Palpable: [Left:Yes] [Right:Yes] Electronic Signature(s) Signed: 09/26/2022 5:08:09 PM By: Rosalio Loud MSN RN CNS WTA Entered By: Rosalio Loud on 09/26/2022 15:43:20 -------------------------------------------------------------------------------- Multi Wound Chart Details Patient Name: Date of Service: [facility], [name] 09/26/2022 2:45 PM Medical Record Number: [id number] Patient Account Number: [id number] Date of Birth/Sex: Treating RN: 03/20/1941 (81 y.o. Drema Pry Primary Care Alexius Ellington: Tracie Harrier Other Clinician: Referring Mavis Gravelle: Treating Kathia Covington/Extender: Kalman Shan Self, Referral Weeks in Treatment: 0 Vital  Signs Height(in): 59 Pulse(bpm): 76 Weight(lbs): 116 Blood Pressure(mmHg): 145/81 Body Mass Index(BMI): 23.4 Temperature(F): 98.4 Respiratory Rate(breaths/min): 16 [6:Photos:] Right Calcaneus Left, Medial Lower Leg Left, Distal Gluteus Wound Location: Pressure Injury Not Known Pressure Injury Wounding Event: Pressure Ulcer Venous Leg Ulcer Pressure Ulcer Primary Etiology: 07/23/2022 07/23/2022 07/23/2022 Date Acquired: 0 0 0 Weeks of Treatment: Open Open Open Wound Status: CORI, JUSTUS L ([id number]) [id number] Page 5 of 11 No No No Wound Recurrence: 3x4x0.3 5.7x1.7x 2x3.5x0.3 Measurements L x W x D (cm) 9.425 7.611 5.498 A (cm) : rea 2.827 N/A 1.649 Volume (cm) : Unstageable/Unclassified Full Thickness Without Exposed Unstageable/Unclassified Classification: Support Structures Medium Small Medium Exudate A mount: Purulent Serous Purulent Exudate Type: yellow, brown, green amber yellow, brown, green Exudate  Color: Yes No Yes Foul Odor A Cleansing: fter No N/A No Odor Anticipated Due to Product Use: None Present (0%) None Present (0%) None Present (0%) Granulation A mount: Large (67-100%) None Present (0%) Large (67-100%) Necrotic Amount: Eschar N/A Adherent Slough Necrotic Tissue: Fascia: No Fascia: No Fascia: No Exposed Structures: Fat Layer (Subcutaneous Tissue): No Fat Layer (Subcutaneous Tissue): No Fat Layer (Subcutaneous Tissue): No Tendon: No Tendon: No Tendon: No Muscle: No Muscle: No Muscle: No Joint: No Joint: No Joint: No Bone: No Bone: No Bone: No None None None Epithelialization: Debridement - Selective/Open Wound Debridement - Excisional N/A Debridement: Pre-procedure Verification/Time Out 16:02 16:02 N/A Taken: Lidocaine 4% Topical Solution Lidocaine 4% Topical Solution N/A Pain Control: Necrotic/Eschar Subcutaneous, Slough N/A Tissue Debrided: Non-Viable Tissue Skin/Subcutaneous Tissue  N/A Level: 12 9.69 N/A Debridement A (sq cm): rea Curette Curette N/A Instrument: Minimum Minimum N/A Bleeding: Pressure Pressure N/A Hemostasis A chieved: Procedure was tolerated well Procedure was tolerated well N/A Debridement Treatment Response: 3x4x0.5 5.7x1.7x0.2 N/A Post Debridement Measurements L x W x D (cm) 4.712 1.522 N/A Post Debridement Volume: (cm) Unstageable/Unclassified N/A N/A Post Debridement Stage: Debridement Debridement N/A Procedures Performed: Treatment Notes Electronic Signature(s) Signed: 09/26/2022 5:04:05 PM By: Rosalio Loud MSN RN CNS WTA Previous Signature: 09/26/2022 4:43:59 PM Version By: Kalman Shan DO Entered By: Rosalio Loud on 09/26/2022 17:04:05 -------------------------------------------------------------------------------- Multi-Disciplinary Care Plan Details Patient Name: Date of Service: [facility], [name] 09/26/2022 2:45 PM Medical Record Number: [id number] Patient Account Number: [id number] Date of Birth/Sex: Treating RN: 04-15-1941 (81 y.o. Drema Pry Primary Care Kenrick Pore: Tracie Harrier Other Clinician: Referring Circe Chilton: Treating Kyliana Standen/Extender: Kalman Shan Self, Referral Weeks in Treatment: 0 Active Inactive Electronic Signature(s) Signed: 09/26/2022 5:08:09 PM By: Rosalio Loud MSN RN CNS WTA Entered By: Rosalio Loud on 09/26/2022 15:49:31 Harr, [name] ([id number]) [id number] Page 6 of 11 -------------------------------------------------------------------------------- Pain Assessment Details Patient Name: Date of Service: [facility], [name] 09/26/2022 2:45 PM Medical Record Number: [id number] Patient Account Number: [id number] Date of Birth/Sex: Treating RN: 12-07-1940 (81 y.o. Drema Pry Primary Care Shadd Dunstan: Tracie Harrier Other Clinician: Referring Graceland Wachter: Treating Rhaya Coale/Extender: Kalman Shan Self, Referral Weeks in Treatment:  0 Active Problems Location of Pain Severity and Description of Pain Patient Has Paino Yes Site Locations Pain Location: Pain in Ulcers With Dressing Change: Yes Duration of the Pain. Constant / Intermittento Constant Rate the pain. Current Pain Level: 8 Worst Pain Level: 10 Least Pain Level: 3 Tolerable Pain Level: 1 Character of Pain Describe the Pain: Aching, Shooting, Throbbing Pain Management and Medication Current Pain Management: Electronic Signature(s) Signed: 09/26/2022 5:08:09 PM By: Rosalio Loud MSN RN CNS WTA Entered By: Rosalio Loud on 09/26/2022 15:19:43 -------------------------------------------------------------------------------- Patient/Caregiver Education Details Patient Name: Date of Service: [facility], [name] 11/15/2023andnbsp2:45 PM Medical Record Number: [id number] Patient Account Number: [id number] Date of Birth/Gender: Treating RN: 07-05-1941 (81 y.o. Drema Pry Primary Care Physician: Tracie Harrier Other Clinician: MADELLYN, DENIO ([id number]) [id number] Page 7 of 11 Referring Physician: Treating Physician/Extender: Kalman Shan Self, Referral Weeks in Treatment: 0 Education Assessment Education Provided To: Patient and Caregiver Education Topics Provided [place]: o Handouts: Welcome T [facility] [place] o Methods: Explain/Verbal Responses: State content correctly Wound/Skin Impairment: Handouts: Caring for Your Ulcer Methods: Explain/Verbal Responses: State content correctly Electronic Signature(s) Signed: 09/26/2022 5:08:09 PM By: Rosalio Loud MSN RN CNS WTA Entered By: Rosalio Loud on 09/26/2022 17:03:38 -------------------------------------------------------------------------------- Wound Assessment Details Patient Name: Date of Service:  [place], [name] 09/26/2022 2:45 PM Medical Record Number: [id number] Patient Account Number: [id number] Date of  Birth/Sex: Treating RN: 1941-09-18 (81 y.o. Drema Pry Primary Care Kelsen Celona: Tracie Harrier Other Clinician: Referring Lydon Vansickle: Treating Lanaysia Fritchman/Extender: Kalman Shan Self, Referral Weeks in Treatment: 0 Wound Status Wound Number: 6 Primary Etiology: Pressure Ulcer Wound Location: Right Calcaneus Wound Status: Open Wounding Event: Pressure Injury Date Acquired: 07/23/2022 Weeks Of Treatment: 0 Clustered Wound: No Photos Wound Measurements Length: (cm) 3 Width: (cm) 4 Lacson, Milca L ([id number]) Depth: (cm) Area: (cm) Volume: (cm) % Reduction in Area: % Reduction in Volume: [id number] Page 8 of 11 0.3 Epithelialization: None 9.425 Tunneling: No 2.827 Undermining: No Wound Description Classification: Unstageable/Unclassified Exudate Amount: Medium Exudate Type: Purulent Exudate Color: yellow, brown, green Foul Odor After Cleansing: Yes Due to Product Use: No Slough/Fibrino Yes Wound Bed Granulation Amount: None Present (0%) Exposed Structure Necrotic Amount: Large (67-100%) Fascia Exposed: No Necrotic Quality: Eschar Fat Layer (Subcutaneous Tissue) Exposed: No Tendon Exposed: No Muscle Exposed: No Joint Exposed: No Bone Exposed: No Treatment Notes Wound #6 (Calcaneus) Wound Laterality: Right Cleanser Byram Ancillary Kit - 15 Day Supply Discharge Instruction: Use supplies as instructed; Kit contains: (15) Saline Bullets; (15) 3x3 Gauze; 15 pr Gloves Soap and Water Discharge Instruction: Gently cleanse wound with antibacterial soap, rinse and pat dry prior to dressing wounds Peri-Wound Care Topical Activon Honey Gel, 25 (g) Tube Primary Dressing Silvercel 4 1/4x 4 1/4 (in/in) Discharge Instruction: Apply Silvercel 4 1/4x 4 1/4 (in/in) as instructed Secondary Dressing ABD Pad 5x9 (in/in) Discharge Instruction: Cover with ABD pad Secured With Kerlix Roll Sterile or Non-Sterile 6-ply 4.5x4 (yd/yd) Discharge  Instruction: Apply Kerlix as directed Compression Wrap Compression Stockings Add-Ons Electronic Signature(s) Signed: 09/26/2022 5:08:09 PM By: Rosalio Loud MSN RN CNS WTA Entered By: Rosalio Loud on 09/26/2022 15:35:52 -------------------------------------------------------------------------------- Wound Assessment Details Patient Name: Date of Service: [facility], [name] 09/26/2022 2:45 PM Medical Record Number: [id number] Patient Account Number: [id number] Date of Birth/Sex: Treating RN: 04/14/1941 (81 y.o. Drema Pry Primary Care Zaim Nitta: Tracie Harrier Other Clinician: ARIELLE, EBER ([id number]) [id number] Page 9 of 11 Referring Mckinsey Keagle: Treating Milanie Rosenfield/Extender: Kalman Shan Self, Referral Weeks in Treatment: 0 Wound Status Wound Number: 7 Primary Venous Leg Ulcer Etiology: Wound Location: Left, Medial Lower Leg Wound Open Wounding Event: Not Known Status: Date Acquired: 07/23/2022 Comorbid Cataracts, Lymphedema, Hypertension, Peripheral Arterial Disease, Weeks Of Treatment: 0 History: Peripheral Venous Disease, Type II Diabetes, Osteoarthritis, Clustered Wound: No Neuropathy Photos Wound Measurements Length: (cm) 5.7 Width: (cm) 1.7 Depth: (cm) 0.1 Area: (cm) 7.611 Volume: (cm) 0.761 % Reduction in Area: % Reduction in Volume: Epithelialization: None Tunneling: No Undermining: No Wound Description Classification: Full Thickness Without Exposed Support Structures Exudate Amount: Small Exudate Type: Serous Exudate Color: amber Foul Odor After Cleansing: No Slough/Fibrino No Wound Bed Granulation Amount: None Present (0%) Exposed Structure Necrotic Amount: None Present (0%) Fascia Exposed: No Fat Layer (Subcutaneous Tissue) Exposed: No Tendon Exposed: No Muscle Exposed: No Joint Exposed: No Bone Exposed: No Treatment Notes Wound #7 (Lower Leg) Wound Laterality: Left, Medial Cleanser Soap and  Water Discharge Instruction: Gently cleanse wound with antibacterial soap, rinse and pat dry prior to dressing wounds Peri-Wound Care Topical Primary Dressing SILVERCEL Antimicrobial Alginate Dressing, 1x12 (in/in) Secondary Dressing ABD Pad 5x9 (in/in) Discharge Instruction: Cover with ABD pad Secured With Coban Cohesive Bandage 4x5 (yds) Stretched Discharge Instruction: Apply Coban as directed. Kerlix Roll Sterile or Non-Sterile 6-ply 4.5x4 (yd/yd) Discharge Instruction: Apply  Kerlix as directed Compression Wrap Oquendo, Aneya L ([id number]) [id number] Page 10 of 11 Compression Stockings Add-Ons Electronic Signature(s) Signed: 09/27/2022 8:56:40 AM By: Rosalio Loud MSN RN CNS WTA Previous Signature: 09/26/2022 5:08:09 PM Version By: Rosalio Loud MSN RN CNS WTA Entered By: Rosalio Loud on 09/27/2022 08:56:40 -------------------------------------------------------------------------------- Wound Assessment Details Patient Name: Date of Service: [facility], [name] 09/26/2022 2:45 PM Medical Record Number: [id number] Patient Account Number: [id number] Date of Birth/Sex: Treating RN: [birth year] (81 y.o. Drema Pry Primary Care Dystany Duffy: Tracie Harrier Other Clinician: Referring Alizandra Loh: Treating Akya Fiorello/Extender: Kalman Shan Self, Referral Weeks in Treatment: 0 Wound Status Wound Number: 8 Primary Etiology: Pressure Ulcer Wound Location: Left, Distal Gluteus Wound Status: Open Wounding Event: Pressure Injury Date Acquired: 07/23/2022 Weeks Of Treatment: 0 Clustered Wound: No Photos Wound Measurements Length: (cm) 2 Width: (cm) 3.5 Depth: (cm) 0.3 Area: (cm) 5.498 Volume: (cm) 1.649 % Reduction in Area: % Reduction in Volume: Epithelialization: None Tunneling: No Undermining: No Wound Description Classification: Unstageable/Unclassified Exudate Amount: Medium Exudate Type: Purulent Exudate Color: yellow, brown,  green Foul Odor After Cleansing: Yes Due to Product Use: No Slough/Fibrino Yes Wound Bed Granulation Amount: None Present (0%) Exposed Structure Necrotic Amount: Large (67-100%) Fascia Exposed: No Necrotic Quality: Adherent Slough Fat Layer (Subcutaneous Tissue) Exposed: No Tendon Exposed: No Muscle Exposed: No Joint Exposed: No Bone Exposed: No Azpeitia, Takaya L ([id number]) [id number] Page 11 of 11 Treatment Notes Wound #8 (Gluteus) Wound Laterality: Left, Distal Cleanser Dakin 16 (oz) 0.25 Discharge Instruction: Use as directed. Soap and Water Discharge Instruction: Gently cleanse wound with antibacterial soap, rinse and pat dry prior to dressing wounds Peri-Wound Care Topical Primary Dressing Gauze Discharge Instruction: As directed: dry, moistened with saline or moistened with Dakins Solution Secondary Dressing ABD Pad 5x9 (in/in) Discharge Instruction: Cover with ABD pad Secured With Compression Wrap Compression Stockings Add-Ons Electronic Signature(s) Signed: 09/26/2022 5:08:09 PM By: Rosalio Loud MSN RN CNS WTA Entered By: Rosalio Loud on 09/26/2022 15:40:41 -------------------------------------------------------------------------------- [place] Details Patient Name: Date of Service: [facility], [name] 09/26/2022 2:45 PM Medical Record Number: [id number] Patient Account Number: [id number] Date of Birth/Sex: Treating RN: [birth year] (81 y.o. Drema Pry Primary Care Mirielle Byrum: Tracie Harrier Other Clinician: Referring Shlomo Seres: Treating Estevan Kersh/Extender: Kalman Shan Self, Referral Weeks in Treatment: 0 Vital Signs Time Taken: 15:19 Temperature (F): 98.4 Height (in): 59 Pulse (bpm): 76 Source: Stated Respiratory Rate (breaths/min): 16 Weight (lbs): 116 Blood Pressure (mmHg): 145/81 Source: Stated Reference Range: 80 - 120 mg / dl Body Mass Index (BMI): 23.4 Electronic Signature(s) Signed: 09/26/2022 5:08:09  PM By: Rosalio Loud MSN RN CNS WTA Entered By: Rosalio Loud on 09/26/2022 15:22:11

## 2022-09-28 NOTE — Progress Notes (Addendum)
AILAH, BARNA ([id number]) [id number] Page 1 of 12 Visit Report for 09/26/2022 Chief Complaint Document Details Patient Name: Date of Service: [facility], [name]. 09/26/2022 2:45 PM Medical Record Number: [id number] Patient Account Number: [id number] Date of Birth/Sex: Treating RN: Oct 01, 1941 (81 y.o. Drema Pry Primary Care Provider: Tracie Harrier Other Clinician: Referring Provider: Treating Provider/Extender: Arn Medal Weeks in Treatment: 0 Information Obtained from: Patient Chief Complaint 09/26/2022; left lower extremity wound, right heel wound and left buttocks wound Electronic Signature(s) Signed: 09/26/2022 5:04:25 PM By: Rosalio Loud MSN RN CNS WTA Signed: 09/27/2022 5:35:14 PM By: Kalman Shan DO Previous Signature: 09/26/2022 4:43:59 PM Version By: Kalman Shan DO Entered By: Rosalio Loud on 09/26/2022 17:04:25 -------------------------------------------------------------------------------- Debridement Details Patient Name: Date of Service: [facility], [name]. 09/26/2022 2:45 PM Medical Record Number: [id number] Patient Account Number: [id number] Date of Birth/Sex: Treating RN: 1941/09/24 (81 y.o. Drema Pry Primary Care Provider: Tracie Harrier Other Clinician: Referring Provider: Treating Provider/Extender: Arn Medal Weeks in Treatment: 0 Debridement Performed for Assessment: Wound #7 Left,Medial Lower Leg Performed By: Physician Kalman Shan, MD Debridement Type: Debridement Severity of Tissue Pre Debridement: Fat layer exposed Level of Consciousness (Pre-procedure): Awake and Alert Pre-procedure Verification/Time Out Yes - 16:02 Taken: Start Time: 16:02 Pain Control: Lidocaine 4% T opical Solution T Area Debrided ([name] x W): otal 5.7 (cm) x 1.7 (cm) = 9.69 (cm) Tissue and other material debrided: Viable, Non-Viable, Slough, Subcutaneous,  Slough Level: Skin/Subcutaneous Tissue Debridement Description: Excisional Instrument: Curette Bleeding: Minimum Hemostasis Achieved: Pressure Response to Treatment: Procedure was tolerated well Level of Consciousness [name], [name] ([id number]) [id number] Page 2 of 12 Level of Consciousness (Post- Awake and Alert procedure): Post Debridement Measurements of Total Wound Length: (cm) 5.7 Width: (cm) 1.7 Depth: (cm) 0.2 Volume: (cm) 1.522 Character of Wound/Ulcer Post Debridement: Stable Severity of Tissue Post Debridement: Fat layer exposed Post Procedure Diagnosis Same as Pre-procedure Electronic Signature(s) Signed: 09/26/2022 4:43:59 PM By: Kalman Shan DO Signed: 09/26/2022 5:08:09 PM By: Rosalio Loud MSN RN CNS WTA Entered By: Rosalio Loud on 09/26/2022 16:05:30 -------------------------------------------------------------------------------- Debridement Details Patient Name: Date of Service: [facility], [name]. 09/26/2022 2:45 PM Medical Record Number: [id number] Patient Account Number: [id number] Date of Birth/Sex: Treating RN: [birth year] (81 y.o. Drema Pry Primary Care Provider: Tracie Harrier Other Clinician: Referring Provider: Treating Provider/Extender: Arn Medal Weeks in Treatment: 0 Debridement Performed for Assessment: Wound #6 Right Calcaneus Performed By: Physician Kalman Shan, MD Debridement Type: Debridement Level of Consciousness (Pre-procedure): Awake and Alert Pre-procedure Verification/Time Out Yes - 16:02 Taken: Start Time: 16:02 Pain Control: Lidocaine 4% T opical Solution T Area Debrided ([name] x W): otal 3 (cm) x 4 (cm) = 12 (cm) Tissue and other material debrided: Viable, Non-Viable, Eschar Level: Non-Viable Tissue Debridement Description: Selective/Open Wound Instrument: Curette Bleeding: Minimum Hemostasis Achieved: Pressure Response to Treatment: Procedure  was tolerated well Level of Consciousness (Post- Awake and Alert procedure): Post Debridement Measurements of Total Wound Length: (cm) 3 Stage: Unstageable/Unclassified Width: (cm) 4 Depth: (cm) 0.5 Volume: (cm) 4.712 Character of Wound/Ulcer Post Debridement: Stable Post Procedure Diagnosis Same as Pre-procedure Electronic Signature(s) Signed: 09/26/2022 4:43:59 PM By: Kalman Shan DO Signed: 09/26/2022 5:08:09 PM By: Rosalio Loud MSN RN CNS WTA Entered By: Rosalio Loud on 09/26/2022 16:10:55 Vaccaro, [name] ([id number]) [id number] Page 3 of 12 -------------------------------------------------------------------------------- HPI Details Patient Name: Date of Service: [facility], [name]. 09/26/2022 2:45 PM Medical Record Number: [id number] Patient Account  Number: [id number] Date of Birth/Sex: Treating RN: 17-Nov-1940 (81 y.o. Drema Pry Primary Care Provider: Tracie Harrier Other Clinician: Referring Provider: Treating Provider/Extender: Arn Medal Weeks in Treatment: 0 History of Present Illness HPI Description: 81 year old patient who comes with a referral for bilateral lower extremity edema and a lower extremity ulceration and has been sent by her PCP Dr. Placido Sou. I understand the patient was recently put on amoxicillin and doxycycline but could not tolerate the amoxicillin. doxycycline course was completed. a BNP and EKG was supposed to be normal and the patient did not have any dyspnea. the patient has been on a diuretic. The patient was also prescribed a pair of elastic compression stockings of the 20-30 mmHg pressure variety. x-ray of the right ankle was done on 09/20/2015 and showed posttraumatic and postsurgical changes of the right ankle with secondary degenerative changes of the tibiotalar joint and to a lesser degree the subtalar joint. No definite acute bony abnormalities are noted. Past medical  history significant for diabetes mellitus, hypertension, hyperlipidemia, right breast cancer treated with a mastectomy in 2014. She has never smoked. 10/24/2015 -- she had delayed her vascular test because of her husband surgery but she is now ready to get him taken care of. He is also unable to use compression stockings and hence we will need to order her Juzo wraps. 10/31/2015-- was seen by Dr. Lucky Cowboy on 10/28/2015. She had a left lower extremity arterial duplex done at his office a couple of years ago and that was essentially normal. Today they performed a venous duplex which revealed no evidence of deep vein thrombosis, superficial thrombophlebitis, no venous reflex was seen on the right and a minimal amount of reflux was seen on the left great saphenous vein but no significant reflux was seen. Impression was that there was a component of lymphedema present from a previous surgery and he would recommend compression stockings and leg elevation. Readmission: 07-24-2022 upon evaluation today patient presents for initial inspection here in our clinic concerning issues that she has been having with her legs this is actually been going on for several years according to what her family member with her today tells me as well as what the patient reiterates as well. She is currently most recently been seeing Dr. Vickki Muff and subsequently he had her in [place] boots. However 2 weeks ago he referred her to [place] and then subsequently took her out of the [place] boot wraps at that point. At this time the left leg looks to be worse in the right leg currently. She is on Lasix and lisinopril with hydrochlorothiazide she has high blood pressure she also has issues currently with lower extremity swelling and edema which has been an ongoing issue for her as well. Patient does have a history of chronic venous hypertension, lymphedema, diabetes mellitus type 2, hypertension, peripheral vascular disease, and neuropathy. Currently  she is on Lasix as well as lisinopril with hydrochlorothiazide. 08-02-2022 upon evaluation today patient presents for reevaluation the good news is she is actually doing somewhat better in regard to the wound and the overall appearance and sinuses. The unfortunate thing is her infection really is not significantly improved we did have to switch out her antibiotic once we got that final result back and I switched her to Levaquin and away from the doxycycline. Unfortunately the doxycycline had been doing poorly for her. In fact she had had diarrhea from the time she started taking it on Friday and she is still having  it when she shows up today for evaluation. Again I was not aware of this and obviously she does appear to be somewhat dehydrated as well based on what I see. My concern which I discussed with the patient today is the possibility of a C. difficile infection. With that being said fact this started immediately upon taking the doxycycline makes me think that it was just the medicine and is not completely out of her system yet despite having taken the last dose Tuesday morning. Nonetheless with what we are seeing currently I want to be sure that reason I Minna contact her primary care provider and see if a would be willing to see her and test for C. difficile infection. 08-07-2022 upon evaluation today patient appears to be doing well currently in regard to her wounds which are actually measuring much better this is great news. Fortunately I do not see any signs of active infection locally or systemically at this time which is great as well. The good news is she was tested for a C. difficile infection and it was negative. I am very pleased and thankful for primary care provider for doing that so this means that she was just having a severe reaction to the doxycycline we have added that to her allergy list at this point. 08-14-2022 upon evaluation today patient appears to be doing well currently in  regard to her dehydration she is actually significantly improved compared to last time I saw her last week. With that being said I do not see any evidence of active infection systemically at this time which is great news. However locally she still does appear to have cellulitis in left lower extremity. I discussed with her today that I do believe she would benefit from going ahead and starting the [place]. Again we will concerned about the C. difficile infection of the diarrhea but that this turned out to be just an issue with a reaction to the doxycycline. My hope is that the Levaquin will not cause her any complication and will be able to treat the infection. I did review her arterial study as well which was dated on 07-31-2022. It showed that she had a ABI on the right of 1.30 and on the left of 1.27 with a TBI on the right of 1.12 and the left 1.21 this is a normal arterial study. Again on the patient's wound culture she actually did show evidence of Proteus, Morganella, and MRSA. Levaquin is a good option here across the board. 09/26/2022 [name] is a 81 year old female with a past medical history of type 2 diabetes currently controlled on oral agents, venous insufficiency/lymphedema, right breast cancer and chronic diastolic heart failure that presents to the clinic for a 1 month history of nonhealing wounds to the left lower extremity, right heel and left buttocks. She states that the buttocks wound and the right heel wound developed while she was in the hospital. She was admitted on 08/22/2022 for severe sepsis secondary to acute sigmoid and distal colonic diverticulitis. At that time it was noted she had a stage I decubitus ulcer to her bilateral buttocks. A wound to the heel was not mentioned. She currently denies systemic signs of infection. She came into clinic in a blue gown with no undergarments. She has not been dressing the wounds. She states she just recently obtained home  health and they are coming out for the first time this week. She is seen in our clinic often for lower extremity wounds  secondary to venous insufficiency. MANDEEP, FERCH ([id number]) [id number] Page 4 of 12 Electronic Signature(s) Signed: 09/26/2022 4:43:59 PM By: Kalman Shan DO Entered By: Kalman Shan on 09/26/2022 16:38:18 -------------------------------------------------------------------------------- Physical Exam Details Patient Name: Date of Service: [facility], [name]. 09/26/2022 2:45 PM Medical Record Number: [id number] Patient Account Number: [id number] Date of Birth/Sex: Treating RN: 02-Feb-1941 (81 y.o. Drema Pry Primary Care Provider: Tracie Harrier Other Clinician: Referring Provider: Treating Provider/Extender: Arn Medal Weeks in Treatment: 0 Constitutional . Cardiovascular . Psychiatric . Notes T the right heel there is an open wound with eschar throughout o T the left buttocks there is an open wound with nonviable tissue throughout o T the left lower extremity on the lateral aspect there is an open wound with nonviable tissue and postdebridement There was granulation tissue. 2+ pitting o edema to the knee. Venous stasis dermatitis. Electronic Signature(s) Signed: 09/26/2022 4:43:59 PM By: Kalman Shan DO Entered By: Kalman Shan on 09/26/2022 16:43:23 -------------------------------------------------------------------------------- Physician Orders Details Patient Name: Date of Service: [facility], [name]. 09/26/2022 2:45 PM Medical Record Number: [id number] Patient Account Number: [id number] Date of Birth/Sex: Treating RN: 17-May-1941 (81 y.o. Drema Pry Primary Care Provider: Tracie Harrier Other Clinician: Referring Provider: Treating Provider/Extender: Arn Medal Weeks in Treatment: 0 Verbal / Phone Orders: No Diagnosis Coding ICD-10  Coding ZIYANNA, TOLIN ([id number]) [id number] Page 5 of 12 Code Description [phone] Non-pressure chronic ulcer of other part of left lower leg with fat layer exposed I87.312 Chronic venous hypertension (idiopathic) with ulcer of left lower extremity I89.0 Lymphedema, not elsewhere classified E11.622 Type 2 diabetes mellitus with other skin ulcer M08.67 Chronic diastolic (congestive) heart failure L89.320 Pressure ulcer of left buttock, unstageable L89.610 Pressure ulcer of right heel, unstageable Follow-up Appointments Return Appointment in 1 week. [place]: - Clean the bottock wound with soap and water then apply dakins moistened gauze into the wound cover with dry gauze and secure with zetuvit without border and tape, change daily. Clean the right heel wound with soap and water then apply Kosse on the wound and cover with silver cell calcium alginate, ABD pad and wrap with Kerlix, change MWF. Clean the left lower leg wound with soap and water then apply silver cell calcium alginate ABD pad and wrap with kerlix and coban, change every Wednesday. [place] for wound care. May utilize formulary equivalent dressing for wound treatment orders unless otherwise specified. Home Health Nurse may visit [facility] to address patients wound care needs. - HH does not order supplies. [facility] does not take patient insurance. It was triaged to [facility] Bathing/ Shower/ Hygiene Wash wounds with antibacterial soap and water. Wound Treatment Wound #6 - Calcaneus Wound Laterality: Right Cleanser: [place] Ancillary Kit - 15 Day Supply (DME) (Generic) 3 x Per Week/30 Days Discharge Instructions: Use supplies as instructed; Kit contains: (15) Saline Bullets; (15) 3x3 Gauze; 15 pr Gloves Cleanser: Soap and Water 3 x Per Week/30 Days Discharge Instructions: Gently cleanse wound with antibacterial soap, rinse and pat dry prior to dressing wounds Topical:  Activon Honey Gel, 25 (g) Tube 3 x Per Week/30 Days Prim Dressing: Silvercel 4 1/4x 4 1/4 (in/in) (DME) (Generic) 3 x Per Week/30 Days ary Discharge Instructions: Apply Silvercel 4 1/4x 4 1/4 (in/in) as instructed Secondary Dressing: ABD Pad 5x9 (in/in) 3 x Per Week/30 Days Discharge Instructions: Cover with ABD pad Wound #7 - Lower Leg Wound Laterality: Left, Medial Cleanser: Soap and Water 1 x  Per Week/30 Days Discharge Instructions: Gently cleanse wound with antibacterial soap, rinse and pat dry prior to dressing wounds Prim Dressing: SILVERCEL Antimicrobial Alginate Dressing, 1x12 (in/in) ary 1 x Per Week/30 Days Secondary Dressing: ABD Pad 5x9 (in/in) (Generic) 1 x Per Week/30 Days Discharge Instructions: Cover with ABD pad Secured With: Coban Cohesive Bandage 4x5 (yds) Stretched (Generic) 1 x Per Week/30 Days Discharge Instructions: Apply Coban as directed. Secured With: [employer] or Non-Sterile 6-ply 4.5x4 (yd/yd) (Generic) 1 x Per Week/30 Days Discharge Instructions: Apply Kerlix as directed Wound #8 - Gluteus Wound Laterality: Left, Distal Cleanser: Dakin 16 (oz) 0.25 (DME) (Generic) 1 x Per Day/30 Days Discharge Instructions: Use as directed. Cleanser: Soap and Water 1 x Per Day/30 Days Discharge Instructions: Gently cleanse wound with antibacterial soap, rinse and pat dry prior to dressing wounds Prim Dressing: Gauze 1 x Per Day/30 Days ary Discharge Instructions: As directed: dry, moistened with saline or moistened with Dakins Solution Secondary Dressing: ABD Pad 5x9 (in/in) 1 x Per Day/30 Days Discharge Instructions: Cover with ABD pad Electronic Signature(s) Signed: 09/28/2022 1:24:40 PM By: Rosalio Loud MSN RN CNS WTA Signed: 11/27/2022 12:50:44 PM By: Kalman Shan DO Previous Signature: 09/28/2022 7:25:15 AM Version By: Gretta Cool, BSN, RN, CWS, Kim RN, BSN Previous Signature: 09/26/2022 5:08:09 PM Version By: Rosalio Loud MSN RN CNS WTA Previous Signature:  09/27/2022 5:35:14 PM Version By: Talmage Coin, [name] ([id number]) [id number] Page 6 of 12 Previous Signature: 09/27/2022 5:35:14 PM Version By: Kalman Shan DO Previous Signature: 09/26/2022 4:43:59 PM Version By: Kalman Shan DO Entered By: Rosalio Loud on 09/28/2022 13:05:01 -------------------------------------------------------------------------------- Problem List Details Patient Name: Date of Service: [facility], [name]. 09/26/2022 2:45 PM Medical Record Number: [id number] Patient Account Number: [id number] Date of Birth/Sex: Treating RN: 09-Jun-1941 (81 y.o. Drema Pry Primary Care Provider: Tracie Harrier Other Clinician: Referring Provider: Treating Provider/Extender: Arn Medal Weeks in Treatment: 0 Active Problems ICD-10 Encounter Code Description Active Date MDM Diagnosis [phone] Non-pressure chronic ulcer of other part of left lower leg with fat layer exposed11/15/2023 No Yes I87.312 Chronic venous hypertension (idiopathic) with ulcer of left lower extremity 09/26/2022 No Yes I89.0 Lymphedema, not elsewhere classified 09/26/2022 No Yes E11.622 Type 2 diabetes mellitus with other skin ulcer 09/26/2022 No Yes E52.77 Chronic diastolic (congestive) heart failure 09/26/2022 No Yes L89.320 Pressure ulcer of left buttock, unstageable 09/26/2022 No Yes L89.610 Pressure ulcer of right heel, unstageable 09/26/2022 No Yes Inactive Problems Resolved Problems Electronic Signature(s) Signed: 09/26/2022 5:03:47 PM By: Rosalio Loud MSN RN CNS WTA Signed: 09/27/2022 5:35:14 PM By: Kalman Shan DO Previous Signature: 09/26/2022 4:43:59 PM Version By: Kalman Shan DO Entered By: Rosalio Loud on 09/26/2022 17:03:47 [name], [name] ([id number]) [id number] Page 7 of 12 -------------------------------------------------------------------------------- Progress  Note Details Patient Name: Date of Service: [facility], [name]. 09/26/2022 2:45 PM Medical Record Number: [id number] Patient Account Number: [id number] Date of Birth/Sex: Treating RN: Mar 18, 1941 (81 y.o. Drema Pry Primary Care Provider: Tracie Harrier Other Clinician: Referring Provider: Treating Provider/Extender: Arn Medal Weeks in Treatment: 0 Subjective Chief Complaint Information obtained from Patient 09/26/2022; left lower extremity wound, right heel wound and left buttocks wound History of Present Illness (HPI) 81 year old patient who comes with a referral for bilateral lower extremity edema and a lower extremity ulceration and has been sent by her PCP Dr. Placido Sou. I understand the patient was recently put on amoxicillin and doxycycline but could not tolerate the amoxicillin. doxycycline course was  completed. a BNP and EKG was supposed to be normal and the patient did not have any dyspnea. the patient has been on a diuretic. The patient was also prescribed a pair of elastic compression stockings of the 20-30 mmHg pressure variety. x-ray of the right ankle was done on 09/20/2015 and showed posttraumatic and postsurgical changes of the right ankle with secondary degenerative changes of the tibiotalar joint and to a lesser degree the subtalar joint. No definite acute bony abnormalities are noted. Past medical history significant for diabetes mellitus, hypertension, hyperlipidemia, right breast cancer treated with a mastectomy in 2014. She has never smoked. 10/24/2015 -- she had delayed her vascular test because of her husband surgery but she is now ready to get him taken care of. He is also unable to use compression stockings and hence we will need to order her Juzo wraps. 10/31/2015-- was seen by Dr. Lucky Cowboy on 10/28/2015. She had a left lower extremity arterial duplex done at his office a couple of years ago and that was essentially normal. Today  they performed a venous duplex which revealed no evidence of deep vein thrombosis, superficial thrombophlebitis, no venous reflex was seen on the right and a minimal amount of reflux was seen on the left great saphenous vein but no significant reflux was seen. Impression was that there was a component of lymphedema present from a previous surgery and he would recommend compression stockings and leg elevation. Readmission: 07-24-2022 upon evaluation today patient presents for initial inspection here in our clinic concerning issues that she has been having with her legs this is actually been going on for several years according to what her family member with her today tells me as well as what the patient reiterates as well. She is currently most recently been seeing Dr. Vickki Muff and subsequently he had her in [place] boots. However 2 weeks ago he referred her to [place] and then subsequently took her out of the [place] boot wraps at that point. At this time the left leg looks to be worse in the right leg currently. She is on Lasix and lisinopril with hydrochlorothiazide she has high blood pressure she also has issues currently with lower extremity swelling and edema which has been an ongoing issue for her as well. Patient does have a history of chronic venous hypertension, lymphedema, diabetes mellitus type 2, hypertension, peripheral vascular disease, and neuropathy. Currently she is on Lasix as well as lisinopril with hydrochlorothiazide. 08-02-2022 upon evaluation today patient presents for reevaluation the good news is she is actually doing somewhat better in regard to the wound and the overall appearance and sinuses. The unfortunate thing is her infection really is not significantly improved we did have to switch out her antibiotic once we got that final result back and I switched her to Levaquin and away from the doxycycline. Unfortunately the doxycycline had been doing poorly for her. In fact she had had  diarrhea from the time she started taking it on Friday and she is still having it when she shows up today for evaluation. Again I was not aware of this and obviously she does appear to be somewhat dehydrated as well based on what I see. My concern which I discussed with the patient today is the possibility of a C. difficile infection. With that being said fact this started immediately upon taking the doxycycline makes me think that it was just the medicine and is not completely out of her system yet despite having taken the last dose Tuesday  morning. Nonetheless with what we are seeing currently I want to be sure that reason I Minna contact her primary care provider and see if a would be willing to see her and test for C. difficile infection. 08-07-2022 upon evaluation today patient appears to be doing well currently in regard to her wounds which are actually measuring much better this is great news. Fortunately I do not see any signs of active infection locally or systemically at this time which is great as well. The good news is she was tested for a C. difficile infection and it was negative. I am very pleased and thankful for primary care provider for doing that so this means that she was just having a severe reaction to the doxycycline we have added that to her allergy list at this point. 08-14-2022 upon evaluation today patient appears to be doing well currently in regard to her dehydration she is actually significantly improved compared to last time I saw her last week. With that being said I do not see any evidence of active infection systemically at this time which is great news. However locally she still does appear to have cellulitis in left lower extremity. I discussed with her today that I do believe she would benefit from going ahead and starting the Saunemin. Again we will concerned about the C. difficile infection of the diarrhea but that this turned out to be just an issue with a reaction  to the doxycycline. My hope is that the Levaquin will not cause her any complication and will be able to treat the infection. I did review her arterial study as well which was dated on 07-31-2022. It showed that she had a ABI on the right of 1.30 and on the left of 1.27 with a TBI on the right of 1.12 and the left 1.21 this is a normal arterial study. Again on the patient's wound culture she actually did show evidence of Proteus, Morganella, and MRSA. Levaquin is a good option here across the board. 09/26/2022 [name] is a 81 year old female with a past medical history of type 2 diabetes currently controlled on oral agents, venous insufficiency/lymphedema, right breast cancer and chronic diastolic heart failure that presents to the clinic for a 1 month history of nonhealing wounds to the left lower extremity, right heel and left buttocks. She states that the buttocks wound and the right heel wound developed while she was in the hospital. She was admitted on 08/22/2022 for severe sepsis secondary to acute sigmoid and distal colonic diverticulitis. At that time it was noted she had a stage I decubitus ulcer to her bilateral buttocks. A wound to the heel was not mentioned. She currently denies systemic signs of infection. She came into clinic in a blue gown [name], [name] ([id number]) [id number] Page 8 of 12 with no undergarments. She has not been dressing the wounds. She states she just recently obtained home health and they are coming out for the first time this week. She is seen in our clinic often for lower extremity wounds secondary to venous insufficiency. Patient History Information obtained from Patient. Allergies Sulfa (Sulfonamide Antibiotics), doxycycline (Severity: Severe, Reaction: severe diarrhea) Family History Heart Disease - Mother,Father, Hypertension - Mother,Father, Stroke - Mother,Father, No family history of Cancer, Diabetes,  Hereditary Spherocytosis, Kidney Disease, Lung Disease, Seizures, Thyroid Problems, Tuberculosis. Social History Never smoker, Marital Status - Married, Alcohol Use - Never, Drug Use - No History, Caffeine Use - Daily. Medical History Eyes Patient has history of  Cataracts - removed 2 years ago Denies history of Glaucoma, Optic Neuritis Ear/Nose/Mouth/Throat Denies history of Chronic sinus problems/congestion, Middle ear problems Hematologic/Lymphatic Patient has history of Lymphedema Denies history of Anemia, Hemophilia, Human Immunodeficiency Virus, Sickle Cell Disease Respiratory Denies history of Aspiration, Asthma, Chronic Obstructive Pulmonary Disease (COPD), Pneumothorax, Sleep Apnea, Tuberculosis Cardiovascular Patient has history of Hypertension, Peripheral Arterial Disease, Peripheral Venous Disease Gastrointestinal Denies history of Cirrhosis , Colitis, Crohnoos, Hepatitis A, Hepatitis B, Hepatitis C Endocrine Patient has history of Type II Diabetes Musculoskeletal Patient has history of Osteoarthritis Neurologic Patient has history of Neuropathy Denies history of Dementia, Quadriplegia, Paraplegia, Seizure Disorder Psychiatric Denies history of Anorexia/bulimia, Confinement Anxiety Medical A Surgical History Notes nd Genitourinary Hx of Kidney stones, hysterectomy Musculoskeletal Rotator cuff disorder Oncologic Breast Ca Objective Constitutional Vitals Time Taken: 3:19 PM, Height: 59 in, Source: Stated, Weight: 116 lbs, Source: Stated, BMI: 23.4, Temperature: 98.4 F, Pulse: 76 bpm, Respiratory Rate: 16 breaths/min, Blood Pressure: 145/81 mmHg. General Notes: T the right heel there is an open wound with eschar throughout T the left buttocks there is an open wound with nonviable tissue throughout T o o o the left lower extremity on the lateral aspect there is an open wound with nonviable tissue and postdebridement There was granulation tissue. 2+ pitting edema to  the knee. Venous stasis dermatitis. Integumentary (Hair, Skin) Wound #6 status is Open. Original cause of wound was Pressure Injury. The date acquired was: 07/23/2022. The wound is located on the Right Calcaneus. The wound measures 3cm length x 4cm width x 0.3cm depth; 9.425cm^2 area and 2.827cm^3 volume. There is no tunneling or undermining noted. There is a medium amount of purulent drainage noted. Foul odor after cleansing was noted. There is no granulation within the wound bed. There is a large (67-100%) amount of necrotic tissue within the wound bed including Eschar. Wound #7 status is Open. Original cause of wound was Not Known. The date acquired was: 07/23/2022. The wound is located on the Left,Medial Lower Leg. The wound measures 5.7cm length x 1.7cm width; 7.611cm^2 area. There is no tunneling or undermining noted. There is a small amount of serous drainage noted. There is no granulation within the wound bed. There is no necrotic tissue within the wound bed. Wound #8 status is Open. Original cause of wound was Pressure Injury. The date acquired was: 07/23/2022. The wound is located on the Left,Distal Gluteus. The wound measures 2cm length x 3.5cm width x 0.3cm depth; 5.498cm^2 area and 1.649cm^3 volume. There is no tunneling or undermining noted. There is a medium amount of purulent drainage noted. Foul odor after cleansing was noted. There is no granulation within the wound bed. There is a large (67-100%) amount of necrotic tissue within the wound bed including Adherent Slough. [name], [name] ([id number]) [id number] Page 9 of 12 Assessment Active Problems ICD-10 Non-pressure chronic ulcer of other part of left lower leg with fat layer exposed Chronic venous hypertension (idiopathic) with ulcer of left lower extremity Lymphedema, not elsewhere classified Type 2 diabetes mellitus with other skin ulcer Chronic diastolic (congestive) heart failure Pressure  ulcer of left buttock, unstageable Pressure ulcer of right heel, unstageable Patient presents with a 1 month history of nonhealing ulcer to the left lower extremity, right heel and left buttocks. The left lower extremity wound is secondary to venous insufficiency. I debrided nonviable tissue here and recommended silver alginate under Kerlix/Coban. This appears well-healing and should heal up in the next 1 to 2 weeks. Unfortunately she developed  pressure ulcers to her right heel and left buttocks that she states occurred during her hospitalization. The right heel has eschar throughout. I was able to debride some of this but recommended Medihoney and silver alginate daily. I recommended aggressive offloading to this area and we will order a Prevalon boot. She does not walk and only transfers at this time. T the left buttocks there was mild odor on intake. o No signs of surrounding tissue infection. I recommended Dakin's wet-to-dry dressings here. Also aggressive offloading. Follow-up in 1 week. Procedures Wound #6 Pre-procedure diagnosis of Wound #6 is a Pressure Ulcer located on the Right Calcaneus . There was a Selective/Open Wound Non-Viable Tissue Debridement with a total area of 12 sq cm performed by Kalman Shan, MD. With the following instrument(s): Curette to remove Viable and Non-Viable tissue/material. Material removed includes Eschar after achieving pain control using Lidocaine 4% Topical Solution. No specimens were taken. A time out was conducted at 16:02, prior to the start of the procedure. A Minimum amount of bleeding was controlled with Pressure. The procedure was tolerated well. Post Debridement Measurements: 3cm length x 4cm width x 0.5cm depth; 4.712cm^3 volume. Post debridement Stage noted as Unstageable/Unclassified. Character of Wound/Ulcer Post Debridement is stable. Post procedure Diagnosis Wound #6: Same as Pre-Procedure Wound #7 Pre-procedure diagnosis of Wound #7 is a  Venous Leg Ulcer located on the Left,Medial Lower Leg .Severity of Tissue Pre Debridement is: Fat layer exposed. There was a Excisional Skin/Subcutaneous Tissue Debridement with a total area of 9.69 sq cm performed by Kalman Shan, MD. With the following instrument(s): Curette to remove Viable and Non-Viable tissue/material. Material removed includes Subcutaneous Tissue and Slough and after achieving pain control using Lidocaine 4% T opical Solution. No specimens were taken. A time out was conducted at 16:02, prior to the start of the procedure. A Minimum amount of bleeding was controlled with Pressure. The procedure was tolerated well. Post Debridement Measurements: 5.7cm length x 1.7cm width x 0.2cm depth; 1.522cm^3 volume. Character of Wound/Ulcer Post Debridement is stable. Severity of Tissue Post Debridement is: Fat layer exposed. Post procedure Diagnosis Wound #7: Same as Pre-Procedure Plan Follow-up Appointments: Return Appointment in 1 week. Home Health: [facility]: - Clean the bottock wound with soap and water then apply dakins moistened gauze into the wound cover with dry gauze and secure with zetuvit with border. Clean the right heel wound with soap and water then apply [place] on the wound and cover with calcium alginate, ABD pad and wrap with Kerlix. Clean the left lower leg wound with soap and water then apply calcium alginate ABD pad and wrap with kerlix and coban Bathing/ Shower/ Hygiene: Wash wounds with antibacterial soap and water. 1. In office sharp debridement 2. Silver alginate and Medihoneyooright heel 3. Silver alginate under Kerlix/Coban/left lower extremity 4. Dakin's wet-to-dry dressing to the left buttocks wound 5. Aggressive offloadingooreposition every 2 hours and Prevalon boot to the right leg/foot Electronic Signature(s) Signed: 09/26/2022 4:43:59 PM By: Kalman Shan DO Entered By: Kalman Shan on 09/26/2022 16:43:35 [name], [name] ([id number]) [id number] Page 10 of 12 -------------------------------------------------------------------------------- ROS/PFSH Details Patient Name: Date of Service: [facility], [name]. 09/26/2022 2:45 PM Medical Record Number: [id number] Patient Account Number: [id number] Date of Birth/Sex: Treating RN: 1941/07/10 (81 y.o. Drema Pry Primary Care Provider: Tracie Harrier Other Clinician: Referring Provider: Treating Provider/Extender: Arn Medal Weeks in Treatment: 0 Information Obtained From Patient Eyes Medical History: Positive for: Cataracts - removed 2 years ago  Negative for: Glaucoma; Optic Neuritis Ear/Nose/Mouth/Throat Medical History: Negative for: Chronic sinus problems/congestion; Middle ear problems Hematologic/Lymphatic Medical History: Positive for: Lymphedema Negative for: Anemia; Hemophilia; Human Immunodeficiency Virus; Sickle Cell Disease Respiratory Medical History: Negative for: Aspiration; Asthma; Chronic Obstructive Pulmonary Disease (COPD); Pneumothorax; Sleep Apnea; Tuberculosis Cardiovascular Medical History: Positive for: Hypertension; Peripheral Arterial Disease; Peripheral Venous Disease Gastrointestinal Medical History: Negative for: Cirrhosis ; Colitis; Crohns; Hepatitis A; Hepatitis B; Hepatitis C Endocrine Medical History: Positive for: Type II Diabetes Time with diabetes: 8years Treated with: Oral agents Blood sugar tested every day: No Genitourinary Medical History: Past Medical History Notes: Hx of Kidney stones, hysterectomy Musculoskeletal Medical History: Positive for: Osteoarthritis Past Medical History Notes: Rotator cuff disorder CHELSIE, BUREL [name] ([id number]) [id number] Page 11 of 12 Neurologic Medical History: Positive for: Neuropathy Negative for: Dementia; Quadriplegia; Paraplegia; Seizure Disorder Oncologic Medical  History: Past Medical History Notes: Breast Ca Psychiatric Medical History: Negative for: Anorexia/bulimia; Confinement Anxiety HBO Extended History Items Eyes: Cataracts Immunizations Pneumococcal Vaccine: Received Pneumococcal Vaccination: Yes Received Pneumococcal Vaccination On or After 60th Birthday: Yes Implantable Devices None Family and Social History Cancer: No; Diabetes: No; Heart Disease: Yes - Mother,Father; Hereditary Spherocytosis: No; Hypertension: Yes - Mother,Father; Kidney Disease: No; Lung Disease: No; Seizures: No; Stroke: Yes - Mother,Father; Thyroid Problems: No; Tuberculosis: No; Never smoker; Marital Status - Married; Alcohol Use: Never; Drug Use: No History; Caffeine Use: Daily; Financial Concerns: No; Food, Clothing or Shelter Needs: No; Support System Lacking: No; Transportation Concerns: No Electronic Signature(s) Signed: 09/26/2022 4:43:59 PM By: Kalman Shan DO Signed: 09/26/2022 5:08:09 PM By: Rosalio Loud MSN RN CNS WTA Entered By: Rosalio Loud on 09/26/2022 15:44:07 -------------------------------------------------------------------------------- [place] Details Patient Name: Date of Service: [facility], [name]. 09/26/2022 Medical Record Number: [id number] Patient Account Number: [id number] Date of Birth/Sex: Treating RN: Oct 01, 1941 (81 y.o. Drema Pry Primary Care Provider: Tracie Harrier Other Clinician: Referring Provider: Treating Provider/Extender: Arn Medal Weeks in Treatment: 0 Diagnosis Coding ICD-10 Codes Code Description [id number] Non-pressure chronic ulcer of other part of left lower leg with fat layer exposed I87.312 Chronic venous hypertension (idiopathic) with ulcer of left lower extremity I89.0 Lymphedema, not elsewhere classified E11.622 Type 2 diabetes mellitus with other skin ulcer K93.81 Chronic diastolic (congestive) heart failure L89.320 Pressure ulcer of left buttock,  unstageable [name], [name] ([id number]) [id number] Page 12 of 12 L89.610 Pressure ulcer of right heel, unstageable Facility Procedures : CPT4 Code: [id number] Description: 17510 - DEB SUBQ TISSUE 20 SQ CM/< ICD-10 Diagnosis Description L97.822 Non-pressure chronic ulcer of other part of left lower leg with fat layer expose Modifier: d Quantity: 1 : CPT4 Code: [id number] Description: 24235 - DEBRIDE WOUND 1ST 20 SQ CM OR < ICD-10 Diagnosis Description L89.610 Pressure ulcer of right heel, unstageable Modifier: Quantity: 1 Physician Procedures : CPT4 Code Description Modifier [id number] 99214 - WC PHYS LEVEL 4 - EST PT ICD-10 Diagnosis Description L97.822 Non-pressure chronic ulcer of other part of left lower leg with fat layer exposed I87.312 Chronic venous hypertension (idiopathic) with ulcer  of left lower extremity L89.320 Pressure ulcer of left buttock, unstageable L89.610 Pressure ulcer of right heel, unstageable Quantity: 1 : [id number] 11042 - WC PHYS SUBQ TISS 20 SQ CM ICD-10 Diagnosis Description L97.822 Non-pressure chronic ulcer of other part of left lower leg with fat layer exposed Quantity: 1 : 6195093 97597 - WC PHYS DEBR WO ANESTH 20 SQ CM ICD-10 Diagnosis Description L89.610 Pressure ulcer of right heel, unstageable Quantity: 1 Electronic Signature(s) Signed: 09/26/2022 5:03:33  PM By: Rosalio Loud MSN RN CNS WTA Signed: 09/27/2022 5:35:14 PM By: Kalman Shan DO Previous Signature: 09/26/2022 4:43:59 PM Version By: Kalman Shan DO Entered By: Rosalio Loud on 09/26/2022 17:03:33

## 2022-10-03 ENCOUNTER — Encounter (HOSPITAL_BASED_OUTPATIENT_CLINIC_OR_DEPARTMENT_OTHER): Payer: PPO | Admitting: Internal Medicine

## 2022-10-03 DIAGNOSIS — L89610 Pressure ulcer of right heel, unstageable: Secondary | ICD-10-CM

## 2022-10-03 DIAGNOSIS — E11622 Type 2 diabetes mellitus with other skin ulcer: Secondary | ICD-10-CM | POA: Diagnosis not present

## 2022-10-07 DIAGNOSIS — L89323 Pressure ulcer of left buttock, stage 3: Secondary | ICD-10-CM | POA: Diagnosis not present

## 2022-10-07 DIAGNOSIS — L89610 Pressure ulcer of right heel, unstageable: Secondary | ICD-10-CM | POA: Diagnosis not present

## 2022-10-07 DIAGNOSIS — I872 Venous insufficiency (chronic) (peripheral): Secondary | ICD-10-CM | POA: Diagnosis not present

## 2022-10-09 ENCOUNTER — Encounter: Payer: Self-pay | Admitting: Emergency Medicine

## 2022-10-09 ENCOUNTER — Emergency Department
Admission: EM | Admit: 2022-10-09 | Discharge: 2022-10-09 | Disposition: A | Discharge status: Home / Self Care | Payer: PPO | Attending: Emergency Medicine | Admitting: Emergency Medicine

## 2022-10-09 DIAGNOSIS — T83198A Other mechanical complication of other urinary devices and implants, initial encounter: Secondary | ICD-10-CM | POA: Diagnosis not present

## 2022-10-09 DIAGNOSIS — Z7984 Long term (current) use of oral hypoglycemic drugs: Secondary | ICD-10-CM | POA: Insufficient documentation

## 2022-10-09 DIAGNOSIS — I10 Essential (primary) hypertension: Secondary | ICD-10-CM | POA: Diagnosis not present

## 2022-10-09 DIAGNOSIS — Z79899 Other long term (current) drug therapy: Secondary | ICD-10-CM | POA: Diagnosis not present

## 2022-10-09 DIAGNOSIS — M25579 Pain in unspecified ankle and joints of unspecified foot: Secondary | ICD-10-CM | POA: Diagnosis not present

## 2022-10-09 DIAGNOSIS — Z853 Personal history of malignant neoplasm of breast: Secondary | ICD-10-CM | POA: Diagnosis not present

## 2022-10-09 DIAGNOSIS — R339 Retention of urine, unspecified: Secondary | ICD-10-CM | POA: Diagnosis present

## 2022-10-09 DIAGNOSIS — T83091A Other mechanical complication of indwelling urethral catheter, initial encounter: Secondary | ICD-10-CM

## 2022-10-09 DIAGNOSIS — T83098A Other mechanical complication of other indwelling urethral catheter, initial encounter: Secondary | ICD-10-CM | POA: Diagnosis not present

## 2022-10-09 DIAGNOSIS — E119 Type 2 diabetes mellitus without complications: Secondary | ICD-10-CM | POA: Insufficient documentation

## 2022-10-09 DIAGNOSIS — D72829 Elevated white blood cell count, unspecified: Secondary | ICD-10-CM | POA: Diagnosis not present

## 2022-10-09 DIAGNOSIS — N390 Urinary tract infection, site not specified: Secondary | ICD-10-CM | POA: Diagnosis not present

## 2022-10-09 DIAGNOSIS — Z743 Need for continuous supervision: Secondary | ICD-10-CM | POA: Diagnosis not present

## 2022-10-09 DIAGNOSIS — T83511A Infection and inflammatory reaction due to indwelling urethral catheter, initial encounter: Secondary | ICD-10-CM | POA: Diagnosis not present

## 2022-10-09 LAB — URINALYSIS, ROUTINE W REFLEX MICROSCOPIC
Bilirubin Urine: NEGATIVE
Glucose, UA: NEGATIVE mg/dL
Ketones, ur: 5 mg/dL — AB
Nitrite: NEGATIVE
Protein, ur: NEGATIVE mg/dL
Specific Gravity, Urine: 1.008 (ref 1.005–1.030)
WBC, UA: >50 WBC/hpf — ABNORMAL HIGH (ref 0–5)
pH: 5 (ref 5.0–8.0)

## 2022-10-09 LAB — CBC WITH DIFFERENTIAL/PLATELET
Abs Immature Granulocytes: 0.12 10*3/uL — ABNORMAL HIGH (ref 0.00–0.07)
Basophils Absolute: 0.1 10*3/uL (ref 0.0–0.1)
Basophils Relative: 1 %
Eosinophils Absolute: 0.3 10*3/uL (ref 0.0–0.5)
Eosinophils Relative: 3 %
HCT: 30.2 % — ABNORMAL LOW (ref 36.0–46.0)
Hemoglobin: 9.1 g/dL — ABNORMAL LOW (ref 12.0–15.0)
Immature Granulocytes: 1 %
Lymphocytes Relative: 27 %
Lymphs Abs: 3 10*3/uL (ref 0.7–4.0)
MCH: 24.8 pg — ABNORMAL LOW (ref 26.0–34.0)
MCHC: 30.1 g/dL (ref 30.0–36.0)
MCV: 82.3 fL (ref 80.0–100.0)
Monocytes Absolute: 0.5 10*3/uL (ref 0.1–1.0)
Monocytes Relative: 5 %
Neutro Abs: 7 10*3/uL (ref 1.7–7.7)
Neutrophils Relative %: 63 %
Platelets: 523 10*3/uL — ABNORMAL HIGH (ref 150–400)
RBC: 3.67 MIL/uL — ABNORMAL LOW (ref 3.87–5.11)
RDW: 18.7 % — ABNORMAL HIGH (ref 11.5–15.5)
WBC: 11.1 10*3/uL — ABNORMAL HIGH (ref 4.0–10.5)
nRBC: 0 % (ref 0.0–0.2)

## 2022-10-09 LAB — MAGNESIUM: Magnesium: 1.9 mg/dL (ref 1.7–2.4)

## 2022-10-09 LAB — COMPREHENSIVE METABOLIC PANEL
ALT: 8 U/L (ref 0–44)
AST: 17 U/L (ref 15–41)
Albumin: 2.9 g/dL — ABNORMAL LOW (ref 3.5–5.0)
Alkaline Phosphatase: 55 U/L (ref 38–126)
Anion gap: 12 (ref 5–15)
BUN: 16 mg/dL (ref 8–23)
CO2: 21 mmol/L — ABNORMAL LOW (ref 22–32)
Calcium: 8.6 mg/dL — ABNORMAL LOW (ref 8.9–10.3)
Chloride: 104 mmol/L (ref 98–111)
Creatinine, Ser: 0.8 mg/dL (ref 0.44–1.00)
GFR, Estimated: >60 mL/min (ref >60)
Glucose, Bld: 96 mg/dL (ref 70–99)
Potassium: 3 mmol/L — ABNORMAL LOW (ref 3.5–5.1)
Sodium: 137 mmol/L (ref 135–145)
Total Bilirubin: 1.1 mg/dL (ref 0.3–1.2)
Total Protein: 6.7 g/dL (ref 6.5–8.1)

## 2022-10-09 LAB — LACTIC ACID, PLASMA: Lactic Acid, Venous: 1.7 mmol/L (ref 0.5–1.9)

## 2022-10-09 LAB — PROCALCITONIN: Procalcitonin: <0.1 ng/mL

## 2022-10-09 MED ORDER — SODIUM CHLORIDE 0.9 % IV SOLN
2.0000 g | Freq: Once | INTRAVENOUS | Status: AC
  Administered 2022-10-09: 2 g via INTRAVENOUS
  Filled 2022-10-09: qty 20

## 2022-10-09 MED ORDER — SODIUM CHLORIDE 0.9 % IV BOLUS (SEPSIS)
1000.0000 mL | Freq: Once | INTRAVENOUS | Status: AC
  Administered 2022-10-09: 1000 mL via INTRAVENOUS

## 2022-10-09 MED ORDER — POTASSIUM CHLORIDE CRYS ER 20 MEQ PO TBCR
40.0000 meq | EXTENDED_RELEASE_TABLET | Freq: Once | ORAL | Status: AC
  Administered 2022-10-09: 40 meq via ORAL
  Filled 2022-10-09: qty 2

## 2022-10-09 MED ORDER — CEPHALEXIN 500 MG PO CAPS: 500.0000 mg | ORAL_CAPSULE | Freq: Four times a day (QID) | ORAL | 0 refills | Status: AC

## 2022-10-09 NOTE — ED Notes (Addendum)
Pt with open, tunneling pressure wound to the sacrum with drainage. Pt with dried stool to the groin as well around indwelling catheter. Foley catheter removed at this time with peri care provided. Decon process completed by Probation officer and Meryl Dare, RN. No live bed bugs found but numerious dead found. All linen, pt belongings and decon material placed into double bag and tied for proper disposal.

## 2022-10-09 NOTE — ED Notes (Addendum)
Family updated as to patient's status. This RN spoke with pts husband at length. Ward MD speaking with husband at this time. Pts husband is reluctant for pt to return home with foley catheter in place due to his concern that he will not be able to appropriately maintain the foley catheter. Both pt and husband are reluctant to consider nursing home placement for the pt at this time. Pt wishes to return home after foley catheter placement but verbalizes understanding that the current plan for her to be discharged to home today may change.

## 2022-10-09 NOTE — ED Notes (Signed)
Pt in bed, pt states that she is ready to go home, dressing placed on pt's stage three pressure ulcer on sacrum. D/c pt iv times two, cath intact, report to ems, drained pt's foley bag, pt reports 8/10 buttock pain, re positioned pt. Pt verbalized understanding d/c instructions and follow up. Pt from dpt via ems.

## 2022-10-09 NOTE — ED Provider Notes (Signed)
[facility] Provider Note    Event Date/Time   First MD Initiated Contact with Patient 10/09/22 [phone]     (approximate)   History   Urinary Retention and Failure To Thrive   HPI  [name] is a 81 y.o. female with hypertension, diabetes, hyperlipidemia, breast cancer who presents to the emergency department EMS from home where she lives with her husband for concerns for Foley catheter not draining today.  Family was concerned that it was dislodged however it is filled with sediment.  She has no complaints of pain.  No fevers, cough, vomiting or diarrhea.  She states she has urology follow-up in 2 days.  Foley catheter was placed at the end of October for urinary retention in the ED.  It has not been exchanged.  Family states they are not sure how to manage the Foley at home.  She also has a chronic sacral decubitus ulcer.  Has wound care come to the house regularly and sees wound care once a week for debridement as an outpatient.  Not currently on antibiotics.   History provided by patient, son, husband, EMS.    Past Medical History:  Diagnosis Date   Arthritis    Arthritis    Breast cancer ([place]) 1992   right breast with lumpectomy and rad tx   Cancer of right breast ([place]) 04/16/2013   right breast with mastectomy   Diabetes mellitus without complication ([place])    Gout    High cholesterol    Hyperlipidemia    Hypertension    Personal history of radiation therapy     Past Surgical History:  Procedure Laterality Date   ABDOMINAL HYSTERECTOMY     ANKLE FRACTURE SURGERY Right 2004   BREAST LUMPECTOMY Right 1992   positive   MASTECTOMY Right 2014   SHOULDER SURGERY Right 2010    MEDICATIONS:  Prior to Admission medications   Medication Sig Start Date End Date Taking? Authorizing Provider  acetaminophen (TYLENOL) 500 MG tablet Take 500-1,000 mg by mouth 4 (four) times daily as needed for mild pain or moderate pain.    [provider]  allopurinol (ZYLOPRIM) 300 MG tablet Take 300 mg by mouth daily.    [provider]  atenolol (TENORMIN) 50 MG tablet Take 50 mg by mouth daily.     [provider]  atorvastatin (LIPITOR) 40 MG tablet Take 40 mg by mouth daily.     [provider]  diphenoxylate-atropine (LOMOTIL) 2.5-0.025 MG tablet Take 1-2 tablets by mouth 3 (three) times daily as needed. 08/20/22   [provider]  furosemide (LASIX) 40 MG tablet Take 40 mg by mouth daily.    [provider]  gentamicin cream (GARAMYCIN) 0.1 % Apply 1 Application topically in the morning and at bedtime.    [provider]  HYDROcodone-acetaminophen (NORCO) 7.5-325 MG tablet Take 1 tablet by mouth 2 (two) times daily as needed for moderate pain or severe pain.    [provider]  levothyroxine (SYNTHROID) 88 MCG tablet Take 88 mcg by mouth daily.    [provider]  metFORMIN (GLUCOPHAGE) 1000 MG tablet Take 1,000 mg by mouth in the morning and at bedtime.    [provider]  sitaGLIPtin (JANUVIA) 50 MG tablet Take 50 mg by mouth daily.    [provider]  vitamin B-12 (CYANOCOBALAMIN) 1000 MCG tablet Take 1,000 mcg by mouth daily.    [provider]    Physical Exam  Triage Vital Signs: ED Triage Vitals [10/09/22 0056]  Enc Vitals Group     BP (!) 143/78     Pulse Rate 94     Resp 18     Temp 97.6 F (36.4 C)     Temp Source Rectal     SpO2 99 %     Weight      Height      Head Circumference      Peak Flow      Pain Score      Pain Loc      Pain Edu?      Excl. in [place]?     Most recent vital signs: Vitals:   10/09/22 0703 10/09/22 0730  BP:  (!) 148/77  Pulse:  (!) 101  Resp:  (!) 21  Temp: 98 F (36.7 C)   SpO2:  100%    CONSTITUTIONAL: Alert and oriented 4 and responds appropriately to questions.  Thin, elderly, afebrile, nontoxic HEAD: Normocephalic, atraumatic EYES: Conjunctivae clear, pupils appear equal,  sclera nonicteric ENT: normal nose; lightly dry mucous membranes NECK: Supple, normal ROM CARD: RRR; S1 and S2 appreciated; no murmurs, no clicks, no rubs, no gallops RESP: Normal chest excursion without splinting or tachypnea; breath sounds clear and equal bilaterally; no wheezes, no rhonchi, no rales, no hypoxia or respiratory distress, speaking full sentences ABD/GI: Normal bowel sounds; non-distended; soft, non-tender, no rebound, no guarding, no peritoneal signs; hard stage IV sacral decubitus ulcer with some surrounding redness but no warmth and no purulent drainage or foul odor BACK: The back appears normal EXT: Normal ROM in all joints; no deformity noted, no edema; no cyanosis, superficial ulcer noted to the heel with appropriate eschar with no surrounding redness, warmth, drainage or odor.  Extremities warm and well-perfused.  Compartment soft. SKIN: Normal color for age and race; warm; no rash on exposed skin NEURO: Moves all extremities equally, normal speech PSYCH: The patient's mood and manner are appropriate.    RIGHT heel      Sacrum   Patient gave verbal permission to utilize photo for medical documentation only. The image was not stored on any personal device.  ED Results / Procedures / Treatments   LABS: (all labs ordered are listed, but only abnormal results are displayed) Labs Reviewed  CBC WITH DIFFERENTIAL/PLATELET - Abnormal; Notable for the following components:      Result Value   WBC 11.1 (*)    RBC 3.67 (*)    Hemoglobin 9.1 (*)    HCT 30.2 (*)    MCH 24.8 (*)    RDW 18.7 (*)    Platelets 523 (*)    Abs Immature Granulocytes 0.12 (*)    All other components within normal limits  COMPREHENSIVE METABOLIC PANEL - Abnormal; Notable for the following components:   Potassium 3.0 (*)    CO2 21 (*)    Calcium 8.6 (*)    Albumin 2.9 (*)    All other components within normal limits  URINALYSIS, ROUTINE W REFLEX MICROSCOPIC - Abnormal; Notable for the  following components:   Color, Urine YELLOW (*)    APPearance CLOUDY (*)    Hgb urine dipstick SMALL (*)    Ketones, ur 5 (*)    Leukocytes,Ua LARGE (*)    WBC, UA >50 (*)    Bacteria, UA RARE (*)    Non Squamous Epithelial PRESENT (*)    All other components within normal limits  AEROBIC/ANAEROBIC CULTURE W GRAM STAIN (SURGICAL/DEEP WOUND)  AEROBIC CULTURE W GRAM STAIN (SUPERFICIAL SPECIMEN)  CULTURE, BLOOD (ROUTINE X 2)  CULTURE, BLOOD (ROUTINE X 2)  URINE CULTURE  LACTIC ACID, PLASMA  PROCALCITONIN  MAGNESIUM  CBG MONITORING, ED     EKG:  EKG Interpretation  Date/Time:  Tuesday October 09 2022 01:00:30 EST Ventricular Rate:  95 PR Interval:  134 QRS Duration: 102 QT Interval:  380 QTC Calculation: 478 R Axis:   10 Text Interpretation: Sinus rhythm Borderline low voltage, extremity leads Confirmed by Pryor Curia [phone]) on 10/09/2022 1:09:31 AM         RADIOLOGY: My personal review and interpretation of imaging:    I have personally reviewed all radiology reports.   No results found.   PROCEDURES:  Critical Care performed: No      .1-3 Lead EKG Interpretation  Performed by: Ela Moffat, Delice Bison, DO Authorized by: Trayveon Beckford, Delice Bison, DO     Interpretation: normal     ECG rate:  89   ECG rate assessment: normal     Rhythm: sinus rhythm     Ectopy: none     Conduction: normal       IMPRESSION / MDM / ASSESSMENT AND PLAN / ED COURSE  I reviewed the triage vital signs and the nursing notes.    Patient here with obstructed Foley catheter, signs of failure to thrive.  The patient is on the cardiac monitor to evaluate for evidence of arrhythmia and/or significant heart rate changes.   DIFFERENTIAL DIAGNOSIS (includes but not limited to):   UTI, obstructed Foley catheter, urinary retention, chronic pressure wounds without signs of superimposed infection, less likely sepsis or bacteremia   Patient's presentation is most consistent with acute  presentation with potential threat to life or bodily function.   PLAN: Will obtain CBC, CMP, lactic, procalcitonin, wound cultures, blood cultures, urinalysis and urine culture.  Will replace Foley catheter.   MEDICATIONS GIVEN IN ED: Medications  sodium chloride 0.9 % bolus 1,000 mL (0 mLs Intravenous Stopped 10/09/22 0424)  potassium chloride SA (KLOR-CON M) CR tablet 40 mEq (40 mEq Oral Given 10/09/22 0245)  cefTRIAXone (ROCEPHIN) 2 g in sodium chloride 0.9 % 100 mL IVPB (0 g Intravenous Stopped 10/09/22 0747)     ED COURSE: Patient's labs show slight leukocytosis of 11,000.  Normal electrolytes other than potassium of 3.0.  Given replacement.  Her albumin is low but this appears chronic and she appears chronically malnourished, failure to thrive.  Lactic normal.  Procalcitonin negative.  Urine does appear infected.  She has not had any urine output other than a very small amount in 7 hours.  She is starting to feel full and distended.  Suspect she is retaining again.  She would like a Foley catheter placed again.  She has follow-up in 2 days.   Wound cultures from her heel and sacral ulcer are pending.  There is no sign of superimposed infection, cellulitis on exam today.   I did discuss with patient that we could admit her to the hospital for further wound care and IV antibiotics for UTI and also discussed possibility of nursing home placement as she may be too much for her husband to care for at home.  Patient is adamant that she wants to go home and does not want admission to the hospital or to be placed in a nursing home.  Her son is at the bedside who agrees.  I talked to her husband on the phone who states he is becoming worried that  she may be too much for him to care for her at home but I explained to him that I cannot go against patient's wishes and admit her keep her for social work evaluation if she wants to leave and has capacity to make decisions for herself.  He verbalized  understanding.  I recommended that they have a long family conversation together about goals of care and also talk to their PCP.  It sounds like she has good outpatient follow-up with urology as well as wound care.  Will discharge with Keflex.  Have again offered admission multiple times and patient declines.   CONSULTS: Admission offered but patient declines.   OUTSIDE RECORDS REVIEWED: Reviewed patient's notes with Dr. [name] in the last month.       FINAL CLINICAL IMPRESSION(S) / ED DIAGNOSES   Final diagnoses:  Acute UTI  Urinary retention  Obstruction of Foley catheter, initial encounter ([place])     Rx / DC Orders   ED Discharge Orders          Ordered    cephALEXin (KEFLEX) 500 MG capsule  4 times daily        10/09/22 0654             Note:  This document was prepared using Dragon voice recognition software and may include unintentional dictation errors.   Annai Heick, Delice Bison, DO 10/09/22 [phone]

## 2022-10-09 NOTE — Progress Notes (Signed)
SIMRAH, CHATHAM ([id number]) [id number] Page 1 of 10 Visit Report for 10/03/2022 Arrival Information Details Patient Name: Date of Service: [name], [name]. 10/03/2022 3:45 PM Medical Record Number: [id number] Patient Account Number: [id number] Date of Birth/Sex: Treating RN: Sep 30, 1941 (81 y.o. [name] Primary Care Brevon Dewald: Tracie Harrier Other Clinician: Massie Kluver Referring Uno Esau: Treating [name] Had a fall or experienced change in No Transfer Assistance: None activities of daily living that may affect Patient Identification Verified: Yes risk of falls: Secondary Verification Process Completed: Yes Signs or symptoms of abuse/neglect since last visito No Patient Requires Transmission-Based Precautions: No Hospitalized since last visit: No Patient Has Alerts: Yes Implantable device outside of the clinic excluding No Patient Alerts: DM II cellular tissue based products placed in the center ABI R 1.30 TBI 1.12 since last visit: ABI [name] 1.27 TBI 1.21 Has Dressing in Place as Prescribed: Yes Has Compression in Place as Prescribed: Yes Pain Present Now: No Electronic Signature(s) Signed: 10/09/2022 4:00:23 PM By: Carlene Coria RN Entered By: Carlene Coria on 10/03/2022 15:58:33 -------------------------------------------------------------------------------- Clinic Level of Care Assessment Details Patient Name: Date of Service: [name], [name]. 10/03/2022 3:45 PM Medical Record Number: [id number] Patient Account Number: [id number] Date of Birth/Sex: Treating RN: 03/08/1941 (81 y.o. [name] Primary Care  Shogo Larkey: Tracie Harrier Other Clinician: Massie Kluver Referring Milbern Doescher: Treating Nelli Swalley/Extender: Arn Medal Weeks in Treatment: 1 Clinic Level of Care Assessment Items TOOL 1 Quantity Score _0  - 0 Use when EandM and Procedure is performed on INITIAL visit ASSESSMENTS - Nursing Assessment / Reassessment _1  - 0 General Physical Exam (combine w/ comprehensive assessment (listed just below) when performed on new pt. evalsJOHNNETTE, [name] ([id number]) [id number] Page 2 of 10 _2  - 0 Comprehensive Assessment (HX, ROS, Risk Assessments, Wounds Hx, etc.) ASSESSMENTS - Wound and Skin Assessment / Reassessment _3  - 0 Dermatologic / Skin Assessment (not related to wound area) ASSESSMENTS - Ostomy and/or Continence Assessment and Care _4  - 0 Incontinence Assessment and Management _5  - 0 Ostomy Care Assessment and Management (repouching, etc.) PROCESS - Coordination of Care _6  - 0 Simple Patient / Family Education for ongoing care _7  - 0 Complex (extensive) Patient / Family Education for ongoing care _8  - 0 Staff obtains Programmer, systems, Records, T Results / Process Orders est _9  - 0 Staff telephones HHA, Nursing Homes / Clarify orders / etc _10  - 0 Routine Transfer to another Facility (non-emergent condition) _11  - 0 Routine Hospital Admission (non-emergent condition) _12  - 0 New Admissions / Biomedical engineer / Ordering NPWT Apligraf, etc. , _13  - 0 Emergency Hospital Admission (emergent condition) PROCESS - Special Needs _14  - 0 Pediatric / Minor Patient Management _15  - 0 Isolation Patient Management _16  - 0 Hearing / Language / Visual special needs _17  - 0 Assessment of Community assistance (transportation, D/C planning, etc.) _18  - 0 Additional assistance / Altered mentation _19  - 0 Support Surface(s) Assessment (bed, cushion, seat, etc.) INTERVENTIONS - Miscellaneous _20  - 0 External ear exam _21  - 0 Patient  Transfer (multiple staff / Civil Service fast streamer / Similar devices) _22  - 0 Simple Staple / Suture removal (25 or less) _23  - 0 Complex Staple / Suture removal (26 or more) _24  - 0  Hypo/Hyperglycemic Management (do not check if billed separately) _0  - 0 Ankle / Brachial Index (ABI) - do not check if billed separately Has the patient been seen at the hospital within the last three years: Yes Total Score: 0 Level Of Care: ____ Electronic Signature(s) Signed: 10/09/2022 4:00:23 PM By: Carlene Coria RN Entered By: Carlene Coria on 10/03/2022 16:51:19 -------------------------------------------------------------------------------- Encounter Discharge Information Details Patient Name: Date of Service: [name]. 10/03/2022 3:45 PM Medical Record Number: [id number] Patient Account Number: [id number] Date of Birth/Sex: Treating RN: [birth year] (81 y.o. [name] Primary Care Taahir Grisby: Tracie Harrier Other Clinician: Sidnee, Gambrill ([id number]) [id number] Page 3 of 10 Referring Tena Linebaugh: Treating Russell Engelstad/Extender: Arn Medal Weeks in Treatment: 1 Encounter Discharge Information Items Post Procedure Vitals Discharge Condition: Stable Temperature (F): 98.1 Ambulatory Status: Wheelchair Pulse (bpm): 102 Discharge Destination: Home Respiratory Rate (breaths/min): 18 Transportation: Private Auto Blood Pressure (mmHg): 161/70 Accompanied By: self Schedule Follow-up Appointment: Yes Clinical Summary of Care: Electronic Signature(s) Signed: 10/03/2022 4:52:57 PM By: Carlene Coria RN Entered By: Carlene Coria on 10/03/2022 16:52:57 -------------------------------------------------------------------------------- Lower Extremity Assessment Details Patient Name: Date of Service: [facility], [name]. 10/03/2022 3:45 PM Medical Record Number: [id number] Patient Account Number: [id number] Date of Birth/Sex: Treating  RN: April 13, 1941 (81 y.o. [name] Primary Care Erick Murin: Tracie Harrier Other Clinician: Massie Kluver Referring Beaulah Romanek: Treating Yaffa Seckman/Extender: Arn Medal Weeks in Treatment: 1 Electronic Signature(s) Signed: 10/09/2022 4:00:23 PM By: Carlene Coria RN Entered By: Carlene Coria on 10/03/2022 16:15:43 -------------------------------------------------------------------------------- Multi Wound Chart Details Patient Name: Date of Service: [facility], [name]. 10/03/2022 3:45 PM Medical Record Number: [id number] Patient Account Number: [id number] Date of Birth/Sex: Treating RN: 1941-08-26 (81 y.o. [name] Primary Care Mariaha Ellington: Tracie Harrier Other Clinician: Massie Kluver Referring [place]: Treating Arayla Kruschke/Extender: Arn Medal Weeks in Treatment: 1 Vital Signs Height(in): 59 Pulse(bpm): 102 Weight(lbs): 116 Blood Pressure(mmHg): 161/70 Body Mass Index(BMI): 23.4 Temperature(F): 98.1 Respiratory Rate(breaths/min): 18 [[name], [name] ([id number]):Wound Assessments] [[id number] Page 4 of 29:[id number] Page 4 of 10] Wound Number: _1 Photos: Right Calcaneus Left, Medial Lower Leg Left, Distal Gluteus Wound Location: Pressure Injury Not Known Pressure Injury Wounding Event: Pressure Ulcer Venous Leg Ulcer Pressure Ulcer Primary Etiology: Cataracts, Lymphedema, Cataracts, Lymphedema, Cataracts, Lymphedema, Comorbid History: Hypertension, Peripheral Arterial Hypertension, Peripheral Arterial Hypertension, Peripheral Arterial Disease, Peripheral Venous Disease, Disease, Peripheral Venous Disease, Disease, Peripheral Venous Disease, Type II Diabetes, Osteoarthritis, Type II Diabetes, Osteoarthritis, Type II Diabetes, Osteoarthritis, Neuropathy Neuropathy Neuropathy 07/23/2022 07/23/2022 07/23/2022 Date Acquired: _2 Weeks of Treatment: Open  Open Open Wound Status: No No No Wound Recurrence: 2.7x3.2x0.3 0x0x0 3x3x2.1 Measurements [name] x W x D (cm) 6.786 0 7.069 A (cm) : rea 2.036 0 14.844 Volume (cm) : 28.00% 100.00% -28.60% % Reduction in Area: 28.00% 100.00% -800.20% % Reduction in Volume: Unstageable/Unclassified Full Thickness Without Exposed Unstageable/Unclassified Classification: Support Structures Medium Small Medium Exudate A mount: Serosanguineous Serous Serosanguineous Exudate Type: red, brown amber red, brown Exudate Color: Yes No Yes Foul Odor A Cleansing: fter No N/A No Odor Anticipated Due to Product Use: None Present (0%) None Present (0%) None Present (0%) Granulation A mount: Large (67-100%) None Present (0%) Large (67-100%) Necrotic Amount: Eschar N/A Adherent Slough Necrotic Tissue: Fascia: No Fascia: No Fascia: No Exposed Structures: Fat Layer (Subcutaneous Tissue): No Fat Layer (Subcutaneous Tissue): No Fat Layer (Subcutaneous Tissue): No Tendon: No Tendon: No Tendon: No Muscle: No Muscle: No Muscle: No  Joint: No Joint: No Joint: No Bone: No Bone: No Bone: No None Large (67-100%) None Epithelialization: Treatment Notes Electronic Signature(s) Signed: 10/09/2022 4:00:23 PM By: Carlene Coria RN Entered By: Carlene Coria on 10/03/2022 16:15:57 -------------------------------------------------------------------------------- Multi-Disciplinary Care Plan Details Patient Name: Date of Service: [facility], [name]. 10/03/2022 3:45 PM Medical Record Number: [id number] Patient Account Number: [id number] Date of Birth/Sex: Treating RN: 1941-10-05 (81 y.o. [name] Primary Care Jeffery Bachmeier: Tracie Harrier Other Clinician: Massie Kluver Referring Jyron Turman: Treating Mabry Tift/Extender: Arn Medal Weeks in Treatment: 1 Active Inactive ALONNA, BARTLING ([id number]) [id number] Page 5 of 10 Electronic  Signature(s) Signed: 10/09/2022 4:00:23 PM By: Carlene Coria RN Entered By: Carlene Coria on 10/03/2022 16:15:49 -------------------------------------------------------------------------------- Pain Assessment Details Patient Name: Date of Service: [facility], [name]. 10/03/2022 3:45 PM Medical Record Number: [id number] Patient Account Number: [id number] Date of Birth/Sex: Treating RN: 1941/01/01 (81 y.o. [name] Primary Care Eshaan Titzer: Tracie Harrier Other Clinician: Massie Kluver Referring Thomas Rhude: Treating Daivd Fredericksen/Extender: Arn Medal Weeks in Treatment: 1 Active Problems Location of Pain Severity and Description of Pain Patient Has Paino No Site Locations Pain Management and Medication Current Pain Management: Electronic Signature(s) Signed: 10/09/2022 4:00:23 PM By: Carlene Coria RN Entered By: Carlene Coria on 10/03/2022 15:59:34 Patient/Caregiver Education Details -------------------------------------------------------------------------------- [name] ([id number]) [id number] Page 6 of 10 Patient Name: Date of Service: [facility], [name]. 11/22/2023andnbsp3:45 PM Medical Record Number: [id number] Patient Account Number: [id number] Date of Birth/Gender: Treating RN: Sep 13, 1941 (81 y.o. [name] Primary Care Physician: Tracie Harrier Other Clinician: Massie Kluver Referring Physician: Treating Physician/Extender: Arn Medal Weeks in Treatment: 1 Education Assessment Education Provided To: Patient Education Topics Provided Wound/Skin Impairment: Methods: Explain/Verbal Responses: State content correctly Electronic Signature(s) Signed: 10/09/2022 4:00:23 PM By: Carlene Coria RN Entered By: Carlene Coria on 10/03/2022 16:51:31 -------------------------------------------------------------------------------- Wound Assessment Details Patient Name: Date of  Service: [place], [name]. 10/03/2022 3:45 PM Medical Record Number: [id number] Patient Account Number: [id number] Date of Birth/Sex: Treating RN: 12-27-1940 (81 y.o. [name] Primary Care Kemani Demarais: Tracie Harrier Other Clinician: Massie Kluver Referring Bilaal Leib: Treating Kevante Lunt/Extender: Arn Medal Weeks in Treatment: 1 Wound Status Wound Number: 6 Primary Pressure Ulcer Etiology: Wound Location: Right Calcaneus Wound Open Wounding Event: Pressure Injury Status: Date Acquired: 07/23/2022 Comorbid Cataracts, Lymphedema, Hypertension, Peripheral Arterial Disease, Weeks Of Treatment: 1 History: Peripheral Venous Disease, Type II Diabetes, Osteoarthritis, Clustered Wound: No Neuropathy Photos Wound Measurements Length: (cm) 2.7 Width: (cm) 3.2 Depth: (cm) 0.3 Area: (cm) 6.7 [name], [name] ([id number]) Volume: (cm) % Reduction in Area: 28% % Reduction in Volume: 28% Epithelialization: None 86 Tunneling: No [id number] Page 7 of 10 2.036 Undermining: No Wound Description Classification: Unstageable/Unclassified Exudate Amount: Medium Exudate Type: Serosanguineous Exudate Color: red, brown Foul Odor After Cleansing: Yes Due to Product Use: No Slough/Fibrino Yes Wound Bed Granulation Amount: None Present (0%) Exposed Structure Necrotic Amount: Large (67-100%) Fascia Exposed: No Necrotic Quality: Eschar Fat Layer (Subcutaneous Tissue) Exposed: No Tendon Exposed: No Muscle Exposed: No Joint Exposed: No Bone Exposed: No Treatment Notes Wound #6 (Calcaneus) Wound Laterality: Right Cleanser Byram Ancillary Kit - 15 Day Supply Discharge Instruction: Use supplies as instructed; Kit contains: (15) Saline Bullets; (15) 3x3 Gauze; 15 pr Gloves Soap and Water Discharge Instruction: Gently cleanse wound with antibacterial soap, rinse and pat dry prior to dressing wounds Peri-Wound Care Topical Activon  Honey Gel, 25 (g) Tube Primary Dressing Silvercel 4 1/4x 4 1/4 (in/in)  Discharge Instruction: Apply Silvercel 4 1/4x 4 1/4 (in/in) as instructed Secondary Dressing ABD Pad 5x9 (in/in) Discharge Instruction: Cover with ABD pad Kerlix 4.5 x 4.1 (in/yd) Discharge Instruction: Apply Kerlix 4.5 x 4.1 (in/yd) as instructed Secured With Rufus H Soft Cloth Surgical T ape ape, 2x2 (in/yd) Compression Wrap Compression Stockings Add-Ons Electronic Signature(s) Signed: 10/09/2022 4:00:23 PM By: Carlene Coria RN Entered By: Carlene Coria on 10/03/2022 16:13:21 -------------------------------------------------------------------------------- Wound Assessment Details Patient Name: Date of Service: [facility], [name]. 10/03/2022 3:45 PM Medical Record Number: [id number] Patient Account Number: [id number] Date of Birth/Sex: Treating RN: 04-Mar-1941 (81 y.o. [name] Primary Care Madelline Eshbach: Tracie Harrier Other Clinician: Massie Kluver Referring Jedaiah Rathbun: Treating Trypp Heckmann/Extender: Arn Medal [name], [name] ([id number]) [id number] Page 8 of 10 Weeks in Treatment: 1 Wound Status Wound Number: 7 Primary Venous Leg Ulcer Etiology: Wound Location: Left, Medial Lower Leg Wound Open Wounding Event: Not Known Status: Date Acquired: 07/23/2022 Comorbid Cataracts, Lymphedema, Hypertension, Peripheral Arterial Disease, Weeks Of Treatment: 1 History: Peripheral Venous Disease, Type II Diabetes, Osteoarthritis, Clustered Wound: No Neuropathy Photos Wound Measurements Length: (cm) Width: (cm) Depth: (cm) Area: (cm) Volume: (cm) 0 % Reduction in Area: 100% 0 % Reduction in Volume: 100% 0 Epithelialization: Large (67-100%) 0 Tunneling: No 0 Undermining: No Wound Description Classification: Full Thickness Without Exposed Support Exudate Amount: Small Exudate Type: Serous Exudate Color: amber Structures Foul  Odor After Cleansing: No Slough/Fibrino No Wound Bed Granulation Amount: None Present (0%) Exposed Structure Necrotic Amount: None Present (0%) Fascia Exposed: No Fat Layer (Subcutaneous Tissue) Exposed: No Tendon Exposed: No Muscle Exposed: No Joint Exposed: No Bone Exposed: No Electronic Signature(s) Signed: 10/09/2022 4:00:23 PM By: Carlene Coria RN Entered By: Carlene Coria on 10/03/2022 16:14:33 -------------------------------------------------------------------------------- Wound Assessment Details Patient Name: Date of Service: [facility], [name]. 10/03/2022 3:45 PM Medical Record Number: [id number] Patient Account Number: [id number] Date of Birth/Sex: Treating RN: July 14, 1941 (81 y.o. [name] Primary Care Tesneem Dufrane: Tracie Harrier Other Clinician: Massie Kluver Referring Baird Polinski: Treating Naseer Hearn/Extender: Arn Medal Weeks in Treatment: 1 [name], [name] ([id number]) [id number] Page 9 of 10 Wound Status Wound Number: 8 Primary Pressure Ulcer Etiology: Wound Location: Left, Distal Gluteus Wound Open Wounding Event: Pressure Injury Status: Date Acquired: 07/23/2022 Comorbid Cataracts, Lymphedema, Hypertension, Peripheral Arterial Disease, Weeks Of Treatment: 1 History: Peripheral Venous Disease, Type II Diabetes, Osteoarthritis, Clustered Wound: No Neuropathy Photos Wound Measurements Length: (cm) 3 Width: (cm) 3 Depth: (cm) 2.1 Area: (cm) 7.069 Volume: (cm) 14.844 % Reduction in Area: -28.6% % Reduction in Volume: -800.2% Epithelialization: None Tunneling: No Undermining: No Wound Description Classification: Unstageable/Unclassified Exudate Amount: Medium Exudate Type: Serosanguineous Exudate Color: red, brown Foul Odor After Cleansing: Yes Due to Product Use: No Slough/Fibrino Yes Wound Bed Granulation Amount: None Present (0%) Exposed Structure Necrotic Amount: Large  (67-100%) Fascia Exposed: No Necrotic Quality: Adherent Slough Fat Layer (Subcutaneous Tissue) Exposed: No Tendon Exposed: No Muscle Exposed: No Joint Exposed: No Bone Exposed: No Treatment Notes Wound #8 (Gluteus) Wound Laterality: Left, Distal Cleanser Dakin 16 (oz) 0.25 Discharge Instruction: Use as directed. Soap and Water Discharge Instruction: Gently cleanse wound with antibacterial soap, rinse and pat dry prior to dressing wounds Peri-Wound Care Topical Primary Dressing Gauze Discharge Instruction: moistened with Dakins Solution Secondary Dressing ABD Pad 5x9 (in/in) Discharge Instruction: Cover with ABD pad Secured With Medipore T - 7M Medipore H Soft Cloth Surgical T ape ape, 2x2 (in/yd) Compression Wrap Compression Stockings Add-Ons [name],  [name] ([id number]) [id number] Page 10 of 10 Electronic Signature(s) Signed: 10/09/2022 4:00:23 PM By: Carlene Coria RN Entered By: Carlene Coria on 10/03/2022 16:15:11 -------------------------------------------------------------------------------- Vitals Details Patient Name: Date of Service: [facility], [name]. 10/03/2022 3:45 PM Medical Record Number: [id number] Patient Account Number: [id number] Date of Birth/Sex: Treating RN: [birth year] (81 y.o. [name] Primary Care Gionni Freese: Tracie Harrier Other Clinician: Massie Kluver Referring Judieth Mckown: Treating Jamell Laymon/Extender: Arn Medal Weeks in Treatment: 1 Vital Signs Time Taken: 15:58 Temperature (F): 98.1 Height (in): 59 Pulse (bpm): 102 Weight (lbs): 116 Respiratory Rate (breaths/min): 18 Body Mass Index (BMI): 23.4 Blood Pressure (mmHg): 161/70 Reference Range: 80 - 120 mg / dl Electronic Signature(s) Signed: 10/09/2022 4:00:23 PM By: Carlene Coria RN Entered By: Carlene Coria on 10/03/2022 15:59:25

## 2022-10-09 NOTE — ED Notes (Signed)
[facility] called for transport home/rep:joshua

## 2022-10-09 NOTE — ED Notes (Signed)
Pt in bed, pt denies pain, pt has foley in place, appears to be draining yellow urine, pt awaits transport home.

## 2022-10-09 NOTE — Progress Notes (Signed)
[name], [name] ([id number]) [id number] Page 1 of 10 Visit Report for 10/03/2022 Chief Complaint Document Details Patient Name: Date of Service: [facility], [place] [name]. 10/03/2022 3:45 PM Medical Record Number: [id number] Patient Account Number: [id number] Date of Birth/Sex: Treating RN: 28-Jun-1941 (81 y.o. [name] Primary Care Provider: Tracie Harrier Other Clinician: Massie Kluver Referring Provider: Treating Provider/Extender: Arn Medal Weeks in Treatment: 1 Information Obtained from: Patient Chief Complaint 09/26/2022; left lower extremity wound, right heel wound and left buttocks wound Electronic Signature(s) Signed: 10/03/2022 4:53:17 PM By: Kalman Shan DO Entered By: Kalman Shan on 10/03/2022 16:27:07 -------------------------------------------------------------------------------- Debridement Details Patient Name: Date of Service: [place], [name] [name]. 10/03/2022 3:45 PM Medical Record Number: [id number] Patient Account Number: [id number] Date of Birth/Sex: Treating RN: Mar 24, 1941 (81 y.o. [name] Primary Care Provider: Tracie Harrier Other Clinician: Massie Kluver Referring Provider: Treating Provider/Extender: Arn Medal Weeks in Treatment: 1 Debridement Performed for Assessment: Wound #6 Right Calcaneus Performed By: Physician Kalman Shan, MD Debridement Type: Debridement Level of Consciousness (Pre-procedure): Awake and Alert Pre-procedure Verification/Time Out Yes - 16:20 Taken: Start Time: 16:20 T Area Debrided ([name] x W): otal 2.7 (cm) x 3.2 (cm) = 8.64 (cm) Tissue and other material debrided: Viable, Non-Viable, Eschar, Slough, Subcutaneous, Skin: Dermis , Skin: Epidermis, Slough Level: Skin/Subcutaneous Tissue Debridement Description: Excisional Instrument: Blade, Curette, Forceps Bleeding: Minimum Hemostasis Achieved: Pressure End Time:  16:26 Procedural Pain: 0 Post Procedural Pain: 0 Response to Treatment: Procedure was tolerated well Level of Consciousness [name], [name] ([id number]) [id number] Page 2 of 10 Level of Consciousness (Post- Awake and Alert procedure): Post Debridement Measurements of Total Wound Length: (cm) 2.7 Stage: Unstageable/Unclassified Width: (cm) 3.2 Depth: (cm) 0.3 Volume: (cm) 2.036 Character of Wound/Ulcer Post Debridement: Improved Post Procedure Diagnosis Same as Pre-procedure Electronic Signature(s) Signed: 10/03/2022 4:53:17 PM By: Kalman Shan DO Signed: 10/09/2022 4:00:23 PM By: Carlene Coria RN Entered By: Carlene Coria on 10/03/2022 16:26:33 -------------------------------------------------------------------------------- HPI Details Patient Name: Date of Service: [facility], [name] [name]. 10/03/2022 3:45 PM Medical Record Number: [id number] Patient Account Number: [id number] Date of Birth/Sex: Treating RN: Jan 03, 1941 (81 y.o. [name] Primary Care Provider: Tracie Harrier Other Clinician: Massie Kluver Referring Provider: Treating Provider/Extender: Arn Medal Weeks in Treatment: 1 History of Present Illness HPI Description: 81 year old patient who comes with a referral for bilateral lower extremity edema and a lower extremity ulceration and has been sent by her PCP Dr. Placido Sou. I understand the patient was recently put on amoxicillin and doxycycline but could not tolerate the amoxicillin. doxycycline course was completed. a BNP and EKG was supposed to be normal and the patient did not have any dyspnea. the patient has been on a diuretic. The patient was also prescribed a pair of elastic compression stockings of the 20-30 mmHg pressure variety. x-ray of the right ankle was done on 09/20/2015 and showed posttraumatic and postsurgical changes of the right ankle with secondary degenerative changes of the  tibiotalar joint and to a lesser degree the subtalar joint. No definite acute bony abnormalities are noted. Past medical history significant for diabetes mellitus, hypertension, hyperlipidemia, right breast cancer treated with a mastectomy in 2014. She has never smoked. 10/24/2015 -- she had delayed her vascular test because of her husband surgery but she is now ready to get him taken care of. He is also unable to use compression stockings and hence we will need to order her Juzo wraps. 10/31/2015-- was seen by Dr.  Dew on 10/28/2015. She had a left lower extremity arterial duplex done at his office a couple of years ago and that was essentially normal. Today they performed a venous duplex which revealed no evidence of deep vein thrombosis, superficial thrombophlebitis, no venous reflex was seen on the right and a minimal amount of reflux was seen on the left great saphenous vein but no significant reflux was seen. Impression was that there was a component of lymphedema present from a previous surgery and he would recommend compression stockings and leg elevation. Readmission: 07-24-2022 upon evaluation today patient presents for initial inspection here in our clinic concerning issues that she has been having with her legs this is actually been going on for several years according to what her family member with her today tells me as well as what the patient reiterates as well. She is currently most recently been seeing Dr. Vickki Muff and subsequently he had her in [place] boots. However 2 weeks ago he referred her to [place] and then subsequently took her out of the [place] boot wraps at that point. At this time the left leg looks to be worse in the right leg currently. She is on Lasix and lisinopril with hydrochlorothiazide she has high blood pressure she also has issues currently with lower extremity swelling and edema which has been an ongoing issue for her as well. Patient does have a history of chronic venous  hypertension, lymphedema, diabetes mellitus type 2, hypertension, peripheral vascular disease, and neuropathy. Currently she is on Lasix as well as lisinopril with hydrochlorothiazide. 08-02-2022 upon evaluation today patient presents for reevaluation the good news is she is actually doing somewhat better in regard to the wound and the overall appearance and sinuses. The unfortunate thing is her infection really is not significantly improved we did have to switch out her antibiotic once we got that final result back and I switched her to Levaquin and away from the doxycycline. Unfortunately the doxycycline had been doing poorly for her. In fact she had had diarrhea from the time she started taking it on Friday and she is still having it when she shows up today for evaluation. Again I was not aware of this and obviously she does appear to be somewhat dehydrated as well based on what I see. My concern which I discussed with the patient today is the possibility of a C. difficile infection. With that being said fact this started immediately upon taking the doxycycline makes me think that it was just the medicine and is not completely out of her system yet despite having taken the last dose Tuesday morning. Nonetheless with what we are seeing currently I want to be sure that reason I Minna contact her primary care provider and see if a would be willing to see her and test for C. difficile infection. ADDALYNN, KUMARI ([id number]) [id number] Page 3 of 10 08-07-2022 upon evaluation today patient appears to be doing well currently in regard to her wounds which are actually measuring much better this is great news. Fortunately I do not see any signs of active infection locally or systemically at this time which is great as well. The good news is she was tested for a C. difficile infection and it was negative. I am very pleased and thankful for primary care provider for doing that so  this means that she was just having a severe reaction to the doxycycline we have added that to her allergy list at this point. 08-14-2022 upon evaluation today patient  appears to be doing well currently in regard to her dehydration she is actually significantly improved compared to last time I saw her last week. With that being said I do not see any evidence of active infection systemically at this time which is great news. However locally she still does appear to have cellulitis in left lower extremity. I discussed with her today that I do believe she would benefit from going ahead and starting the [place]. Again we will concerned about the C. difficile infection of the diarrhea but that this turned out to be just an issue with a reaction to the doxycycline. My hope is that the Levaquin will not cause her any complication and will be able to treat the infection. I did review her arterial study as well which was dated on 07-31-2022. It showed that she had a ABI on the right of 1.30 and on the left of 1.27 with a TBI on the right of 1.12 and the left 1.21 this is a normal arterial study. Again on the patient's wound culture she actually did show evidence of Proteus, Morganella, and MRSA. Levaquin is a good option here across the board. 09/26/2022 Ms. Diane Bilski is a 81 year old female with a past medical history of type 2 diabetes currently controlled on oral agents, venous insufficiency/lymphedema, right breast cancer and chronic diastolic heart failure that presents to the clinic for a 1 month history of nonhealing wounds to the left lower extremity, right heel and left buttocks. She states that the buttocks wound and the right heel wound developed while she was in the hospital. She was admitted on 08/22/2022 for severe sepsis secondary to acute sigmoid and distal colonic diverticulitis. At that time it was noted she had a stage I decubitus ulcer to her bilateral buttocks. A wound to the heel was  not mentioned. She currently denies systemic signs of infection. She came into clinic in a blue gown with no undergarments. She has not been dressing the wounds. She states she just recently obtained home health and they are coming out for the first time this week. She is seen in our clinic often for lower extremity wounds secondary to venous insufficiency. 11/22; patient presents for follow-up. Son is present during the encounter. Per son it sounds like they are not doing any dressing changes. She states she has home health but they have not come out. She is going to let us know which home health agency she has been approved for so we can send orders. Currently she denies signs of infection. Electronic Signature(s) Signed: 10/03/2022 4:53:17 PM By: Kalman Shan DO Entered By: Kalman Shan on 10/03/2022 16:29:14 -------------------------------------------------------------------------------- Physical Exam Details Patient Name: Date of Service: [facility], [name] [name]. 10/03/2022 3:45 PM Medical Record Number: [id number] Patient Account Number: [id number] Date of Birth/Sex: Treating RN: 09-16-1941 (81 y.o. [name] Primary Care Provider: Tracie Harrier Other Clinician: Massie Kluver Referring Provider: Treating Provider/Extender: Arn Medal Weeks in Treatment: 1 Constitutional . Cardiovascular . Psychiatric . Notes T the right heel there is an open wound with mostly eschar but now granulation tissue to the edges circumferentially. T the left buttocks there is a large open o o wound with increased depth and nonviable tissue throughout. The left lower extremity there is epithelization to the previous wound site. Good edema control. Electronic Signature(s) Signed: 10/03/2022 4:53:17 PM By: Kalman Shan DO Entered By: Kalman Shan on 10/03/2022 16:30:23 [place], [name] ([id number]) [id number] Page 4 of  10 -------------------------------------------------------------------------------- Physician  Orders Details Patient Name: Date of Service: [facility], [name] [name]. 10/03/2022 3:45 PM Medical Record Number: [id number] Patient Account Number: [id number] Date of Birth/Sex: Treating RN: [birth year] (81 y.o. [name] Primary Care Provider: Tracie Harrier Other Clinician: Massie Kluver Referring Provider: Treating Provider/Extender: Arn Medal Weeks in Treatment: 1 Verbal / Phone Orders: No Diagnosis Coding Follow-up Appointments Return Appointment in 1 week. [place] for wound care. May utilize formulary equivalent dressing for wound treatment orders unless otherwise specified. Home Health Nurse may visit PRN to address patients wound care needs. Alvis Lemmings [phone] Bathing/ Shower/ Hygiene Wash wounds with antibacterial soap and water. Wound Treatment Wound #6 - Calcaneus Wound Laterality: Right Cleanser: [place] Ancillary Kit - 15 Day Supply (Generic) 3 x Per Week/30 Days Discharge Instructions: Use supplies as instructed; Kit contains: (15) Saline Bullets; (15) 3x3 Gauze; 15 pr Gloves Cleanser: Soap and Water 3 x Per Week/30 Days Discharge Instructions: Gently cleanse wound with antibacterial soap, rinse and pat dry prior to dressing wounds Topical: Activon Honey Gel, 25 (g) Tube 3 x Per Week/30 Days Prim Dressing: Silvercel 4 1/4x 4 1/4 (in/in) (Generic) 3 x Per Week/30 Days ary Discharge Instructions: Apply Silvercel 4 1/4x 4 1/4 (in/in) as instructed Secondary Dressing: ABD Pad 5x9 (in/in) 3 x Per Week/30 Days Discharge Instructions: Cover with ABD pad Secondary Dressing: Kerlix 4.5 x 4.1 (in/yd) 3 x Per Week/30 Days Discharge Instructions: Apply Kerlix 4.5 x 4.1 (in/yd) as instructed Secured With: Medipore T - 14M Medipore H Soft Cloth Surgical T ape ape, 2x2 (in/yd) 3 x Per Week/30 Days Wound #8 - Gluteus Wound  Laterality: Left, Distal Cleanser: Dakin 16 (oz) 0.25 (Generic) 1 x Per Day/30 Days Discharge Instructions: Use as directed. Cleanser: Soap and Water 1 x Per Day/30 Days Discharge Instructions: Gently cleanse wound with antibacterial soap, rinse and pat dry prior to dressing wounds Prim Dressing: Gauze 1 x Per Day/30 Days ary Discharge Instructions: moistened with Dakins Solution Secondary Dressing: ABD Pad 5x9 (in/in) 1 x Per Day/30 Days Discharge Instructions: Cover with ABD pad Secured With: Medipore T - 14M Medipore H Soft Cloth Surgical T ape ape, 2x2 (in/yd) 1 x Per Day/30 Days Electronic Signature(s) Signed: 10/03/2022 4:53:17 PM By: Kalman Shan DO Signed: 10/09/2022 4:00:23 PM By: Carlene Coria RN Entered By: Carlene Coria on 10/03/2022 16:51:01 [place], [name] ([id number]) [id number] Page 5 of 10 -------------------------------------------------------------------------------- Problem List Details Patient Name: Date of Service: [facility], [name] [name]. 10/03/2022 3:45 PM Medical Record Number: [id number] Patient Account Number: [id number] Date of Birth/Sex: Treating RN: 1941-05-30 (81 y.o. [name] Primary Care Provider: Tracie Harrier Other Clinician: Massie Kluver Referring Provider: Treating Provider/Extender: Arn Medal Weeks in Treatment: 1 Active Problems ICD-10 Encounter Code Description Active Date MDM Diagnosis [phone] Non-pressure chronic ulcer of other part of left lower leg with fat layer exposed11/15/2023 No Yes I87.312 Chronic venous hypertension (idiopathic) with ulcer of left lower extremity 09/26/2022 No Yes I89.0 Lymphedema, not elsewhere classified 09/26/2022 No Yes E11.622 Type 2 diabetes mellitus with other skin ulcer 09/26/2022 No Yes G99.24 Chronic diastolic (congestive) heart failure 09/26/2022 No Yes L89.320 Pressure ulcer of left buttock, unstageable 09/26/2022 No Yes L89.610  Pressure ulcer of right heel, unstageable 09/26/2022 No Yes Inactive Problems Resolved Problems Electronic Signature(s) Signed: 10/03/2022 4:53:17 PM By: Kalman Shan DO Entered By: Kalman Shan on 10/03/2022 16:27:02 [place], [name] ([id number]) [id number] Page 6 of 10 -------------------------------------------------------------------------------- Progress Note Details Patient Name: Date of  Service: [facility], [name] [name]. 10/03/2022 3:45 PM Medical Record Number: [id number] Patient Account Number: [id number] Date of Birth/Sex: Treating RN: 1941-01-03 (81 y.o. [name] Primary Care Provider: Tracie Harrier Other Clinician: Massie Kluver Referring Provider: Treating Provider/Extender: Arn Medal Weeks in Treatment: 1 Subjective Chief Complaint Information obtained from Patient 09/26/2022; left lower extremity wound, right heel wound and left buttocks wound History of Present Illness (HPI) 81 year old patient who comes with a referral for bilateral lower extremity edema and a lower extremity ulceration and has been sent by her PCP Dr. Placido Sou. I understand the patient was recently put on amoxicillin and doxycycline but could not tolerate the amoxicillin. doxycycline course was completed. a BNP and EKG was supposed to be normal and the patient did not have any dyspnea. the patient has been on a diuretic. The patient was also prescribed a pair of elastic compression stockings of the 20-30 mmHg pressure variety. x-ray of the right ankle was done on 09/20/2015 and showed posttraumatic and postsurgical changes of the right ankle with secondary degenerative changes of the tibiotalar joint and to a lesser degree the subtalar joint. No definite acute bony abnormalities are noted. Past medical history significant for diabetes mellitus, hypertension, hyperlipidemia, right breast cancer treated with a mastectomy in 2014. She  has never smoked. 10/24/2015 -- she had delayed her vascular test because of her husband surgery but she is now ready to get him taken care of. He is also unable to use compression stockings and hence we will need to order her Juzo wraps. 10/31/2015-- was seen by Dr. Lucky Cowboy on 10/28/2015. She had a left lower extremity arterial duplex done at his office a couple of years ago and that was essentially normal. Today they performed a venous duplex which revealed no evidence of deep vein thrombosis, superficial thrombophlebitis, no venous reflex was seen on the right and a minimal amount of reflux was seen on the left great saphenous vein but no significant reflux was seen. Impression was that there was a component of lymphedema present from a previous surgery and he would recommend compression stockings and leg elevation. Readmission: 07-24-2022 upon evaluation today patient presents for initial inspection here in our clinic concerning issues that she has been having with her legs this is actually been going on for several years according to what her family member with her today tells me as well as what the patient reiterates as well. She is currently most recently been seeing Dr. Vickki Muff and subsequently he had her in [place] boots. However 2 weeks ago he referred her to [place] and then subsequently took her out of the [place] boot wraps at that point. At this time the left leg looks to be worse in the right leg currently. She is on Lasix and lisinopril with hydrochlorothiazide she has high blood pressure she also has issues currently with lower extremity swelling and edema which has been an ongoing issue for her as well. Patient does have a history of chronic venous hypertension, lymphedema, diabetes mellitus type 2, hypertension, peripheral vascular disease, and neuropathy. Currently she is on Lasix as well as lisinopril with hydrochlorothiazide. 08-02-2022 upon evaluation today patient presents for reevaluation the  good news is she is actually doing somewhat better in regard to the wound and the overall appearance and sinuses. The unfortunate thing is her infection really is not significantly improved we did have to switch out her antibiotic once we got that final result back and I switched her to  Levaquin and away from the doxycycline. Unfortunately the doxycycline had been doing poorly for her. In fact she had had diarrhea from the time she started taking it on Friday and she is still having it when she shows up today for evaluation. Again I was not aware of this and obviously she does appear to be somewhat dehydrated as well based on what I see. My concern which I discussed with the patient today is the possibility of a C. difficile infection. With that being said fact this started immediately upon taking the doxycycline makes me think that it was just the medicine and is not completely out of her system yet despite having taken the last dose Tuesday morning. Nonetheless with what we are seeing currently I want to be sure that reason I Minna contact her primary care provider and see if a would be willing to see her and test for C. difficile infection. 08-07-2022 upon evaluation today patient appears to be doing well currently in regard to her wounds which are actually measuring much better this is great news. Fortunately I do not see any signs of active infection locally or systemically at this time which is great as well. The good news is she was tested for a C. difficile infection and it was negative. I am very pleased and thankful for primary care provider for doing that so this means that she was just having a severe reaction to the doxycycline we have added that to her allergy list at this point. 08-14-2022 upon evaluation today patient appears to be doing well currently in regard to her dehydration she is actually significantly improved compared to last time I saw her last week. With that being said I do  not see any evidence of active infection systemically at this time which is great news. However locally she still does appear to have cellulitis in left lower extremity. I discussed with her today that I do believe she would benefit from going ahead and starting the [place]. Again we will concerned about the C. difficile infection of the diarrhea but that this turned out to be just an issue with a reaction to the doxycycline. My hope is that the Levaquin will not cause her any complication and will be able to treat the infection. I did review her arterial study as well which was dated on 07-31-2022. It showed that she had a ABI on the right of 1.30 and on the left of 1.27 with a TBI on the right of 1.12 and the left 1.21 this is a normal arterial study. Again on the patient's wound culture she actually did show evidence of Proteus, Morganella, and MRSA. Levaquin is a good option here across the board. 09/26/2022 Ms. Diane Trusty is a 81 year old female with a past medical history of type 2 diabetes currently controlled on oral agents, venous insufficiency/lymphedema, right breast cancer and chronic diastolic heart failure that presents to the clinic for a 1 month history of nonhealing wounds to the left lower extremity, right heel and left buttocks. She states that the buttocks wound and the right heel wound developed while she was in the hospital. She was admitted on 08/22/2022 for severe sepsis secondary to acute sigmoid and distal colonic diverticulitis. At that time it was noted she had a stage I decubitus ulcer to her bilateral buttocks. A wound to the heel was not mentioned. She currently denies systemic signs of infection. She came into clinic in a blue gown [name], [name] ([id number]) [id number] Page  7 of 10 with no undergarments. She has not been dressing the wounds. She states she just recently obtained home health and they are coming out for the first  time this week. She is seen in our clinic often for lower extremity wounds secondary to venous insufficiency. 11/22; patient presents for follow-up. Son is present during the encounter. Per son it sounds like they are not doing any dressing changes. She states she has home health but they have not come out. She is going to let us know which home health agency she has been approved for so we can send orders. Currently she denies signs of infection. Objective Constitutional Vitals Time Taken: 3:58 PM, Height: 59 in, Weight: 116 lbs, BMI: 23.4, Temperature: 98.1 F, Pulse: 102 bpm, Respiratory Rate: 18 breaths/min, Blood Pressure: 161/70 mmHg. General Notes: T the right heel there is an open wound with mostly eschar but now granulation tissue to the edges circumferentially. T the left buttocks there o o is a large open wound with increased depth and nonviable tissue throughout. The left lower extremity there is epithelization to the previous wound site. Good edema control. Integumentary (Hair, Skin) Wound #6 status is Open. Original cause of wound was Pressure Injury. The date acquired was: 07/23/2022. The wound has been in treatment 1 weeks. The wound is located on the Right Calcaneus. The wound measures 2.7cm length x 3.2cm width x 0.3cm depth; 6.786cm^2 area and 2.036cm^3 volume. There is no tunneling or undermining noted. There is a medium amount of serosanguineous drainage noted. Foul odor after cleansing was noted. There is no granulation within the wound bed. There is a large (67-100%) amount of necrotic tissue within the wound bed including Eschar. Wound #7 status is Open. Original cause of wound was Not Known. The date acquired was: 07/23/2022. The wound has been in treatment 1 weeks. The wound is located on the Left,Medial Lower Leg. The wound measures 0cm length x 0cm width x 0cm depth; 0cm^2 area and 0cm^3 volume. There is no tunneling or undermining noted. There is a small amount of  serous drainage noted. There is no granulation within the wound bed. There is no necrotic tissue within the wound bed. Wound #8 status is Open. Original cause of wound was Pressure Injury. The date acquired was: 07/23/2022. The wound has been in treatment 1 weeks. The wound is located on the Left,Distal Gluteus. The wound measures 3cm length x 3cm width x 2.1cm depth; 7.069cm^2 area and 14.844cm^3 volume. There is no tunneling or undermining noted. There is a medium amount of serosanguineous drainage noted. Foul odor after cleansing was noted. There is no granulation within the wound bed. There is a large (67-100%) amount of necrotic tissue within the wound bed including Adherent Slough. Assessment Active Problems ICD-10 Non-pressure chronic ulcer of other part of left lower leg with fat layer exposed Chronic venous hypertension (idiopathic) with ulcer of left lower extremity Lymphedema, not elsewhere classified Type 2 diabetes mellitus with other skin ulcer Chronic diastolic (congestive) heart failure Pressure ulcer of left buttock, unstageable Pressure ulcer of right heel, unstageable Patient's right heel wound continues to have eschar however now there is granulation tissue to the edges circumferentially. I debrided nonviable tissue. I recommended doing Medihoney and silver alginate and aggressive offloading with the Prevalon boot. The left lower extremity wound has healed. The left buttocks ulcer has increased depth. It was communicated to the patient and son that this is an unstageable wound and at this point it is uncertain how  deep the wound is Until the nonviable tissue was removed. I recommended aggressive offloading here and Dakin's wet-to-dry dressings. We asked her to call us back with the home health agency so we can send orders. Patient is very frail and requires aggressive care. This was communicated to the patient and son. She may require palliative care/hospice in the near  future. Procedures Wound #6 Pre-procedure diagnosis of Wound #6 is a Pressure Ulcer located on the Right Calcaneus . There was a Excisional Skin/Subcutaneous Tissue Debridement with a total area of 8.64 sq cm performed by Kalman Shan, MD. With the following instrument(s): Blade, Curette, and Forceps to remove Viable and Non- Viable tissue/material. Material removed includes Eschar, Subcutaneous Tissue, Slough, Skin: Dermis, and Skin: Epidermis. No specimens were taken. A time out was conducted at 16:20, prior to the start of the procedure. A Minimum amount of bleeding was controlled with Pressure. The procedure was tolerated well with a pain level of 0 throughout and a pain level of 0 following the procedure. Post Debridement Measurements: 2.7cm length x 3.2cm width x 0.3cm depth; 2.036cm^3 volume. Post debridement Stage noted as Unstageable/Unclassified. Character of Wound/Ulcer Post Debridement is improved. Post procedure Diagnosis Wound #6: Same as Pre-Procedure [name], [name] ([id number]) [id number] Page 8 of 10 Plan Follow-up Appointments: Return Appointment in 1 week. Home Health: [facility] for wound care. May utilize formulary equivalent dressing for wound treatment orders unless otherwise specified. Home Health Nurse may visit PRN to address patientoos wound care needs. Bathing/ Shower/ Hygiene: Wash wounds with antibacterial soap and water. WOUND #6: - Calcaneus Wound Laterality: Right Cleanser: Byram Ancillary Kit - 15 Day Supply (Generic) 3 x Per Week/30 Days Discharge Instructions: Use supplies as instructed; Kit contains: (15) Saline Bullets; (15) 3x3 Gauze; 15 pr Gloves Cleanser: Soap and Water 3 x Per Week/30 Days Discharge Instructions: Gently cleanse wound with antibacterial soap, rinse and pat dry prior to dressing wounds Topical: Activon Honey Gel, 25 (g) Tube 3 x Per Week/30 Days Prim Dressing: Silvercel 4 1/4x 4 1/4  (in/in) (Generic) 3 x Per Week/30 Days ary Discharge Instructions: Apply Silvercel 4 1/4x 4 1/4 (in/in) as instructed Secondary Dressing: ABD Pad 5x9 (in/in) 3 x Per Week/30 Days Discharge Instructions: Cover with ABD pad WOUND #7: - Lower Leg Wound Laterality: Left, Medial Cleanser: Soap and Water 1 x Per Week/30 Days Discharge Instructions: Gently cleanse wound with antibacterial soap, rinse and pat dry prior to dressing wounds Prim Dressing: SILVERCEL Antimicrobial Alginate Dressing, 1x12 (in/in) 1 x Per Week/30 Days ary Secondary Dressing: ABD Pad 5x9 (in/in) (Generic) 1 x Per Week/30 Days Discharge Instructions: Cover with ABD pad Secured With: Coban Cohesive Bandage 4x5 (yds) Stretched (Generic) 1 x Per Week/30 Days Discharge Instructions: Apply Coban as directed. Secured With: [employer] or Non-Sterile 6-ply 4.5x4 (yd/yd) (Generic) 1 x Per Week/30 Days Discharge Instructions: Apply Kerlix as directed WOUND #8: - Gluteus Wound Laterality: Left, Distal Cleanser: Dakin 16 (oz) 0.25 (Generic) 1 x Per Day/30 Days Discharge Instructions: Use as directed. Cleanser: Soap and Water 1 x Per Day/30 Days Discharge Instructions: Gently cleanse wound with antibacterial soap, rinse and pat dry prior to dressing wounds Prim Dressing: Gauze 1 x Per Day/30 Days ary Discharge Instructions: As directed: dry, moistened with saline or moistened with Dakins Solution Secondary Dressing: ABD Pad 5x9 (in/in) 1 x Per Day/30 Days Discharge Instructions: Cover with ABD pad 1. In office sharp debridement 2. Silver alginate and Medihoneyooright heel 3. Dakin's wet-to-dry dressing to the  left buttocks wound 4. Aggressive offloadingooreposition every 2 hours and Prevalon boot to the right leg/foot Electronic Signature(s) Signed: 10/03/2022 4:53:17 PM By: Kalman Shan DO Entered By: Kalman Shan on 10/03/2022  16:45:53 -------------------------------------------------------------------------------- ROS/PFSH Details Patient Name: Date of Service: [facility], [name] [name]. 10/03/2022 3:45 PM Medical Record Number: [id number] Patient Account Number: [id number] Date of Birth/Sex: Treating RN: Apr 12, 1941 (81 y.o. [name] Primary Care Provider: Tracie Harrier Other Clinician: Massie Kluver Referring Provider: Treating Provider/Extender: Arn Medal Weeks in Treatment: 1 Information Obtained From Patient Eyes Medical History: Positive for: Cataracts - removed 2 years ago Negative for: Glaucoma; Optic Neuritis [name], [name] ([id number]) [id number] Page 9 of 10 Ear/Nose/Mouth/Throat Medical History: Negative for: Chronic sinus problems/congestion; Middle ear problems Hematologic/Lymphatic Medical History: Positive for: Lymphedema Negative for: Anemia; Hemophilia; Human Immunodeficiency Virus; Sickle Cell Disease Respiratory Medical History: Negative for: Aspiration; Asthma; Chronic Obstructive Pulmonary Disease (COPD); Pneumothorax; Sleep Apnea; Tuberculosis Cardiovascular Medical History: Positive for: Hypertension; Peripheral Arterial Disease; Peripheral Venous Disease Gastrointestinal Medical History: Negative for: Cirrhosis ; Colitis; Crohns; Hepatitis A; Hepatitis B; Hepatitis C Endocrine Medical History: Positive for: Type II Diabetes Time with diabetes: 8years Treated with: Oral agents Blood sugar tested every day: No Genitourinary Medical History: Past Medical History Notes: Hx of Kidney stones, hysterectomy Musculoskeletal Medical History: Positive for: Osteoarthritis Past Medical History Notes: Rotator cuff disorder Neurologic Medical History: Positive for: Neuropathy Negative for: Dementia; Quadriplegia; Paraplegia; Seizure Disorder Oncologic Medical History: Past Medical History Notes: Breast  Ca Psychiatric Medical History: Negative for: Anorexia/bulimia; Confinement Anxiety HBO Extended History Items Eyes: Cataracts Immunizations Pneumococcal Vaccine: Received Pneumococcal Vaccination: Yes Received Pneumococcal Vaccination On or After 60th Birthday: Yes Implantable Devices None [name], [name] ([id number]) [id number] Page 10 of 10 Family and Social History Cancer: No; Diabetes: No; Heart Disease: Yes - Mother,Father; Hereditary Spherocytosis: No; Hypertension: Yes - Mother,Father; Kidney Disease: No; Lung Disease: No; Seizures: No; Stroke: Yes - Mother,Father; Thyroid Problems: No; Tuberculosis: No; Never smoker; Marital Status - Married; Alcohol Use: Never; Drug Use: No History; Caffeine Use: Daily; Financial Concerns: No; Food, Clothing or Shelter Needs: No; Support System Lacking: No; Transportation Concerns: No Electronic Signature(s) Signed: 10/03/2022 4:53:17 PM By: Kalman Shan DO Signed: 10/03/2022 5:38:08 PM By: Gretta Cool, BSN, RN, CWS, Kim RN, BSN Entered By: Kalman Shan on 10/03/2022 16:47:03 -------------------------------------------------------------------------------- [place] Details Patient Name: Date of Service: [facility], [name] [name]. 10/03/2022 Medical Record Number: [id number] Patient Account Number: [id number] Date of Birth/Sex: Treating RN: 1941/01/18 (81 y.o. Charolette Forward, Kim Primary Care Provider: Tracie Harrier Other Clinician: Massie Kluver Referring Provider: Treating Provider/Extender: Arn Medal Weeks in Treatment: 1 Diagnosis Coding ICD-10 Codes Code Description [phone] Non-pressure chronic ulcer of other part of left lower leg with fat layer exposed I87.312 Chronic venous hypertension (idiopathic) with ulcer of left lower extremity I89.0 Lymphedema, not elsewhere classified E11.622 Type 2 diabetes mellitus with other skin ulcer I62.70 Chronic diastolic (congestive) heart  failure L89.320 Pressure ulcer of left buttock, unstageable L89.610 Pressure ulcer of right heel, unstageable Facility Procedures : CPT4 Code: [id number] Description: 82993 - DEB SUBQ TISSUE 20 SQ CM/< ICD-10 Diagnosis Description L89.610 Pressure ulcer of right heel, unstageable Modifier: Quantity: 1 Physician Procedures : CPT4 Code Description Modifier [id number] - WC PHYS SUBQ TISS 20 SQ CM ICD-10 Diagnosis Description L89.610 Pressure ulcer of right heel, unstageable Quantity: 1 Electronic Signature(s) Signed: 10/03/2022 4:53:17 PM By: Kalman Shan DO Entered By: Kalman Shan on 10/03/2022 16:46:44

## 2022-10-09 NOTE — ED Notes (Signed)
Report called to [facility] at [phone] person that answers the phone states that he is the husband, the facility has closed, and this is their home now.  States that there are no longer any staff at the facility because it is now a private residence.  Informed him of her dc and he awaits her arrival.

## 2022-10-10 ENCOUNTER — Ambulatory Visit (INDEPENDENT_AMBULATORY_CARE_PROVIDER_SITE_OTHER): Payer: PPO | Admitting: Urology

## 2022-10-10 ENCOUNTER — Encounter: Payer: Self-pay | Admitting: Urology

## 2022-10-10 ENCOUNTER — Ambulatory Visit: Payer: PPO | Admitting: Internal Medicine

## 2022-10-10 VITALS — BP 140/78 | HR 90 | Ht 59.0 in | Wt 116.0 lb

## 2022-10-10 DIAGNOSIS — N3000 Acute cystitis without hematuria: Secondary | ICD-10-CM | POA: Diagnosis not present

## 2022-10-10 DIAGNOSIS — R339 Retention of urine, unspecified: Secondary | ICD-10-CM | POA: Diagnosis not present

## 2022-10-10 DIAGNOSIS — N1339 Other hydronephrosis: Secondary | ICD-10-CM

## 2022-10-10 NOTE — Progress Notes (Signed)
10/10/2022 12:24 PM   [name] 06-Oct-1941 [id number]  Referring provider: Tracie Harrier, MD [address] [facility] [place],  [place] [address]  Chief Complaint  Patient presents with   New Patient (Initial Visit)    Bladder neck obstruction    HPI: 81 year old female who presents today for further evaluation of urinary retention.  She was admitted in October, discharged on October 14 with severe sepsis secondary to acute diverticulitis, chronic diastolic CHF.  During this admission, she had a CT scan indicating a dilated bladder with moderate bilateral hydronephrosis. Cr at the time was 0.62. A Foley catheter was placed and she was scheduled for outpatient follow-up however this has been delayed due to ongoing medical illness.  She has been back in the ER several times with issues with her catheter, exchange several times, she been treated for several urinary tract infections.    She was just seen yesterday in the emergency room.  Based on the quality of her urine, she was started on antibiotics and she is only taken 1 dose this morning.  Her urine is very cloudy.  Urinalysis is grossly positive and a urine culture is pending.  Notably, she is known to [place], last seen in 2019 for microscopic hematuria evaluation which was unremarkable.  She does have a known asymptomatic cystocele.  She is accompanied today by her adult son.  She is living at home but all of her ADLs are being provided by her family members.  She is not able to stand or ambulate.  She is able to transfer to the toilet for assistance.  She reports that she has never been sicker in her life.  She has sacral decubitus ulcers.  Prior to this admission, she denies any issues urinating.  PMH: Past Medical History:  Diagnosis Date   Arthritis    Arthritis    Breast cancer ([place]) 1992   right breast with lumpectomy and rad tx   Cancer of right breast ([place]) 04/16/2013   right breast with  mastectomy   Diabetes mellitus without complication ([place])    Gout    High cholesterol    Hyperlipidemia    Hypertension    Personal history of radiation therapy     Surgical History: Past Surgical History:  Procedure Laterality Date   ABDOMINAL HYSTERECTOMY     ANKLE FRACTURE SURGERY Right 2004   BREAST LUMPECTOMY Right 1992   positive   MASTECTOMY Right 2014   SHOULDER SURGERY Right 2010    Home Medications:  Allergies as of 10/10/2022       Reactions   Other Anaphylaxis   Anesthisia has been a health issue for her in the past.    Sulfa Antibiotics Rash        Medication List        Accurate as of October 10, 2022 11:59 PM. If you have any questions, ask your nurse or doctor.          acetaminophen 500 MG tablet Commonly known as: TYLENOL Take 500-1,000 mg by mouth 4 (four) times daily as needed for mild pain or moderate pain.   allopurinol 300 MG tablet Commonly known as: ZYLOPRIM Take 300 mg by mouth daily.   atenolol 50 MG tablet Commonly known as: TENORMIN Take 50 mg by mouth daily.   atorvastatin 40 MG tablet Commonly known as: LIPITOR Take 40 mg by mouth daily.   cephALEXin 500 MG capsule Commonly known as: KEFLEX Take 1 capsule (500 mg  total) by mouth 4 (four) times daily for 10 days.   cyanocobalamin 1000 MCG tablet Commonly known as: VITAMIN B12 Take 1,000 mcg by mouth daily.   diphenoxylate-atropine 2.5-0.025 MG tablet Commonly known as: LOMOTIL Take 1-2 tablets by mouth 3 (three) times daily as needed.   furosemide 40 MG tablet Commonly known as: LASIX Take 40 mg by mouth daily.   gentamicin cream 0.1 % Commonly known as: GARAMYCIN Apply 1 Application topically in the morning and at bedtime.   HYDROcodone-acetaminophen 7.5-325 MG tablet Commonly known as: NORCO Take 1 tablet by mouth 2 (two) times daily as needed for moderate pain or severe pain.   levothyroxine 88 MCG tablet Commonly known as: SYNTHROID Take 88 mcg by  mouth daily.   lisinopril 5 MG tablet Commonly known as: ZESTRIL Take 5 mg by mouth daily.   metFORMIN 1000 MG tablet Commonly known as: GLUCOPHAGE Take 1,000 mg by mouth in the morning and at bedtime.   sitaGLIPtin 50 MG tablet Commonly known as: JANUVIA Take 50 mg by mouth daily.        Allergies:  Allergies  Allergen Reactions   Other Anaphylaxis    Anesthisia has been a health issue for her in the past.    Sulfa Antibiotics Rash    Family History: Family History  Problem Relation Age of Onset   Diabetes Father    Breast cancer Neg Hx     Social History:  reports that she has never smoked. She has never been exposed to tobacco smoke. She has never used smokeless tobacco. She reports that she does not drink alcohol and does not use drugs.   Physical Exam: BP (!) 140/78   Pulse 90   Ht '4\' 11"'$  (1.499 m)   Wt 116 lb (52.6 kg)   BMI 23.43 kg/m   Constitutional:  Alert and oriented, No acute distress.  In wheelchair, frail appearing.  Accompanied by adult son. HEENT: [place] AT, moist mucus membranes.  Trachea midline, no masses. Cardiovascular: No clubbing, cyanosis, or edema. Respiratory: Normal respiratory effort, no increased work of breathing. GU: Foley catheter in very cloudy urine. Skin: No rashes, bruises or suspicious lesions. Neurologic: Grossly intact, no focal deficits, moving all 4 extremities. Psychiatric: Normal mood and affect.  Laboratory Data: Lab Results  Component Value Date   WBC 11.1 (H) 10/09/2022   HGB 9.1 (L) 10/09/2022   HCT 30.2 (L) 10/09/2022   MCV 82.3 10/09/2022   PLT 523 (H) 10/09/2022    Lab Results  Component Value Date   CREATININE 0.80 10/09/2022     Lab Results  Component Value Date   HGBA1C 5.7 (H) 08/16/2022    Urinalysis    Component Value Date/Time   COLORURINE YELLOW (A) 10/09/2022 0600   APPEARANCEUR CLOUDY (A) 10/09/2022 0600   APPEARANCEUR Cloudy (A) 09/08/2018 1519   LABSPEC 1.008 10/09/2022 0600    LABSPEC 1.016 05/20/2012 1345   PHURINE 5.0 10/09/2022 0600   GLUCOSEU NEGATIVE 10/09/2022 0600   GLUCOSEU Negative 05/20/2012 1345   HGBUR SMALL (A) 10/09/2022 0600   BILIRUBINUR NEGATIVE 10/09/2022 0600   BILIRUBINUR Negative 09/08/2018 1519   BILIRUBINUR Negative 05/20/2012 1345   KETONESUR 5 (A) 10/09/2022 0600   PROTEINUR NEGATIVE 10/09/2022 0600   NITRITE NEGATIVE 10/09/2022 0600   LEUKOCYTESUR LARGE (A) 10/09/2022 0600   LEUKOCYTESUR 3+ 05/20/2012 1345    Lab Results  Component Value Date   LABMICR See below: 09/08/2018   WBCUA 11-30 (A) 09/08/2018   RBCUA 0-2 09/08/2018  LABEPIT 0-10 09/08/2018   BACTERIA RARE (A) 10/09/2022    Pertinent Imaging: CT abdomen pelvis with contrast from 08/22/2022 was personally reviewed, agree with radiologic interpretation.  In comparison, did go back and also personally reviewed a CT scan from 09/2018.  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  Assessment & Plan:    1. Urinary retention Foley catheter since admission greater than 6 weeks ago which has been exchanged  Ultimately, she will need to undergo voiding trial however at this point in time, she remains relatively frail, was just started on another round of antibiotics for what looks like significant bacteriuria and is relatively late in the afternoon.  For all of these reasons, I do not feel that removing her Foley catheter at this time is reasonable or safe.  Will plan to have her return early next week for both a.m. and p.m. voiding trial, remove the catheter in the a.m. and then return later in the afternoon to assess her voiding status and possibly replace the catheter if deemed necessary.  Etiology of urinary retention is unclear however she was acutely ill at the time with multiple issues which may be contributing factor.  She  also has a known cystocele.  2. Other hydronephrosis New bilateral hydronephrosis in the setting of bladder distention  She has not had any follow-up imaging since Foley catheter was placed and suspect this is the underlying etiology.  Plan for follow-up renal ultrasound down the road to ensure that this is resolved.  Reassuringly, her creatinine is preserved.  She may have low-grade underlying UPJ on the right.  3. Acute cystitis without hematuria Urine is fairly purulent and cloudy today, agree with antibiotics especially in light of upcoming voiding trial, urine culture pending  For voiding trial, 2 part, will eventually need renal ultrasound  Hollice Espy, [place] [address] [place], [place] [address] [phone]  I spent 65 total minutes on the day of the encounter including pre-visit review of the medical record, face-to-face time with the patient, and post visit ordering of labs/imaging/tests.  Good portion of this time spent reviewing previous records, labs, and imaging studies.

## 2022-10-11 LAB — URINE CULTURE: Culture: 20000 — AB

## 2022-10-11 LAB — AEROBIC CULTURE W GRAM STAIN (SUPERFICIAL SPECIMEN)

## 2022-10-11 LAB — AEROBIC/ANAEROBIC CULTURE W GRAM STAIN (SURGICAL/DEEP WOUND)

## 2022-10-12 NOTE — Progress Notes (Signed)
ED Antimicrobial Stewardship Positive Culture Follow Up   [name] is an 81 y.o. female who presented to [facility] on 10/09/2022 with a chief complaint of  Chief Complaint  Patient presents with   Urinary Retention   Failure To Thrive    Recent Results (from the past 720 hour(s))  Blood culture (routine x 2)     Status: None (Preliminary result)   Collection Time: 10/09/22  1:04 AM   Specimen: BLOOD  Result Value Ref Range Status   Specimen Description BLOOD LEFT AC  Final   Special Requests   Final    BOTTLES DRAWN AEROBIC ONLY Blood Culture results may not be optimal due to an inadequate volume of blood received in culture bottles   Culture   Final    NO GROWTH 3 DAYS Performed at [facility], [address], [place], [place] [address]    Report Status PENDING  Incomplete  Aerobic/Anaerobic Culture w Gram Stain (surgical/deep wound)     Status: Abnormal   Collection Time: 10/09/22  1:05 AM   Specimen: Sacral  Result Value Ref Range Status   Specimen Description   Final    SACRAL Performed at [facility], [address] [place], [place] [address]    Special Requests   Final    NONE Performed at [facility], [address] [place], [place] [address]    Gram Stain   Final    FEW WBC PRESENT,BOTH PMN AND MONONUCLEAR FEW GRAM POSITIVE COCCI IN PAIRS RARE GRAM NEGATIVE RODS Performed at [facility], [place] [address], [place], [place] [address]    Culture (A)  Final    MULTIPLE ORGANISMS PRESENT, NONE PREDOMINANT NO STAPHYLOCOCCUS AUREUS ISOLATED No Pseudomonas species isolated MIXED ANAEROBIC FLORA PRESENT.  CALL LAB IF FURTHER IID REQUIRED.    Report Status 10/11/2022 FINAL  Final  Aerobic Culture w Gram Stain (superficial specimen)     Status: Abnormal   Collection Time: 10/09/22  1:05 AM   Specimen: Heel  Result Value Ref Range Status   Specimen Description   Final    HEEL Performed at [facility], [address], [place], [place] [address]    Special Requests   Final    NONE Performed at [facility], [place], [place] [address]    Gram Stain   Final    FEW WBC PRESENT,BOTH PMN AND MONONUCLEAR FEW GRAM POSITIVE COCCI IN PAIRS FEW GRAM POSITIVE RODS    Culture (A)  Final    MULTIPLE ORGANISMS PRESENT, NONE PREDOMINANT NO STAPHYLOCOCCUS AUREUS ISOLATED No Pseudomonas species isolated Performed at [facility] [address], [place], [place] [address]    Report Status 10/11/2022 FINAL  Final  Blood culture (routine x 2)     Status: None (Preliminary result)   Collection Time: 10/09/22  1:34 AM   Specimen: BLOOD  Result Value Ref Range Status   Specimen Description BLOOD LEFT FA  Final   Special Requests   Final    BOTTLES DRAWN AEROBIC AND ANAEROBIC Blood Culture results may not be optimal due to an excessive volume of blood received in culture bottles   Culture   Final    NO GROWTH 3 DAYS Performed at [facility], [address], [place], [place] [address]    Report Status PENDING  Incomplete  Urine Culture     Status: Abnormal   Collection Time: 10/09/22  6:00 AM  Specimen: Urine, Catheterized  Result Value Ref Range Status   Specimen Description   Final    URINE, CATHETERIZED Performed at [facility], [address] [place], [place] [address]    Special Requests   Final    NONE Performed at [facility], [place], [place] [address]    Culture 20,000 COLONIES/mL ENTEROCOCCUS FAECALIS (A)  Final   Report Status 10/11/2022 FINAL  Final   Organism ID, Bacteria ENTEROCOCCUS FAECALIS (A)  Final      Susceptibility   Enterococcus faecalis - MIC*    AMPICILLIN <=2 SENSITIVE Sensitive     NITROFURANTOIN <=16 SENSITIVE Sensitive     VANCOMYCIN 1 SENSITIVE Sensitive     * 20,000 COLONIES/mL ENTEROCOCCUS FAECALIS    '[x]'$  Treated with cephalexin, organism resistant to prescribed  antimicrobial '[]'$  Patient discharged originally without antimicrobial agent and treatment is now indicated  New antibiotic prescription: Amoxicillin 500 mg PO q8H x 5 days  ED Provider: Lucillie Garfinkel  Will M. Ouida Sills, PharmD PGY-1 Pharmacy Resident 10/12/2022 9:42 AM

## 2022-10-14 LAB — CULTURE, BLOOD (ROUTINE X 2)
Culture: NO GROWTH
Culture: NO GROWTH

## 2022-10-16 ENCOUNTER — Telehealth: Payer: Self-pay

## 2022-10-16 NOTE — Telephone Encounter (Signed)
     Patient  visit on 11/28  at [place]   Have you been able to follow up with your primary care physician? Yes   The patient was or was not able to obtain any needed medicine or equipment. Yes   Are there diet recommendations that you are having difficulty following? Yes   Patient expresses understanding of discharge instructions and education provided has no other needs at this time.  Yes      [place], [facility] : [facility], Care Management  [phone] [address], [place], [place] [address] Phone: [phone] Email: [email or web address]

## 2022-10-17 ENCOUNTER — Ambulatory Visit (INDEPENDENT_AMBULATORY_CARE_PROVIDER_SITE_OTHER): Payer: PPO | Admitting: Urology

## 2022-10-17 ENCOUNTER — Encounter: Payer: Self-pay | Admitting: Urology

## 2022-10-17 ENCOUNTER — Ambulatory Visit: Payer: PPO | Admitting: Internal Medicine

## 2022-10-17 VITALS — BP 123/77 | HR 88

## 2022-10-17 DIAGNOSIS — R339 Retention of urine, unspecified: Secondary | ICD-10-CM

## 2022-10-17 LAB — BLADDER SCAN AMB NON-IMAGING: Scan Result: 260

## 2022-10-17 NOTE — Progress Notes (Signed)
10/17/2022 4:44 PM   [name] Dec 06, 1940 [id number]  Referring provider: Tracie Harrier, MD [address] [facility] [place],  [place] [address]  Urological history: 1.  High risk hematuria -Non-smoker -History of microscopic hematuria with 4-10 RBC's -non-contrast CT (2019) -no malignancies -cysto (2019) -no malignancies -No complaints of gross heme -UA (09/2022) -11-20 RBCs associated with UTI and catheter placement  2. Cystocele -seen on numerous CT's -appreciated on exam   Chief Complaint  Patient presents with   Other    HPI: [name] is a 81 y.o. female who presents today for TOV.    The catheter was discontinued this am and she returns this afternoon.    She states that she drinks 4 or 5 small glasses of water since she left this morning.  She states she has not voided and does not feel the urge to void.  PVR 260 mL.     PMH: Past Medical History:  Diagnosis Date   Arthritis    Arthritis    Breast cancer ([place]) 1992   right breast with lumpectomy and rad tx   Cancer of right breast ([place]) 04/16/2013   right breast with mastectomy   Diabetes mellitus without complication ([place])    Gout    High cholesterol    Hyperlipidemia    Hypertension    Personal history of radiation therapy     Surgical History: Past Surgical History:  Procedure Laterality Date   ABDOMINAL HYSTERECTOMY     ANKLE FRACTURE SURGERY Right 2004   BREAST LUMPECTOMY Right 1992   positive   MASTECTOMY Right 2014   SHOULDER SURGERY Right 2010    Home Medications:  Allergies as of 10/17/2022       Reactions   Other Anaphylaxis   Anesthisia has been a health issue for her in the past.    Sulfa Antibiotics Rash        Medication List        Accurate as of October 17, 2022  4:44 PM. If you have any questions, ask your nurse or doctor.          acetaminophen 500 MG tablet Commonly known as: TYLENOL Take 500-1,000 mg by mouth 4  (four) times daily as needed for mild pain or moderate pain.   allopurinol 300 MG tablet Commonly known as: ZYLOPRIM Take 300 mg by mouth daily.   atenolol 50 MG tablet Commonly known as: TENORMIN Take 50 mg by mouth daily.   atorvastatin 40 MG tablet Commonly known as: LIPITOR Take 40 mg by mouth daily.   cephALEXin 500 MG capsule Commonly known as: KEFLEX Take 1 capsule (500 mg total) by mouth 4 (four) times daily for 10 days.   cyanocobalamin 1000 MCG tablet Commonly known as: VITAMIN B12 Take 1,000 mcg by mouth daily.   diphenoxylate-atropine 2.5-0.025 MG tablet Commonly known as: LOMOTIL Take 1-2 tablets by mouth 3 (three) times daily as needed.   furosemide 40 MG tablet Commonly known as: LASIX Take 40 mg by mouth daily.   gentamicin cream 0.1 % Commonly known as: GARAMYCIN Apply 1 Application topically in the morning and at bedtime.   HYDROcodone-acetaminophen 7.5-325 MG tablet Commonly known as: NORCO Take 1 tablet by mouth 2 (two) times daily as needed for moderate pain or severe pain.   levothyroxine 88 MCG tablet Commonly known as: SYNTHROID Take 88 mcg by mouth daily.   lisinopril 5 MG tablet Commonly known as: ZESTRIL Take 5 mg by  mouth daily.   metFORMIN 1000 MG tablet Commonly known as: GLUCOPHAGE Take 1,000 mg by mouth in the morning and at bedtime.   sitaGLIPtin 50 MG tablet Commonly known as: JANUVIA Take 50 mg by mouth daily.        Allergies:  Allergies  Allergen Reactions   Other Anaphylaxis    Anesthisia has been a health issue for her in the past.    Sulfa Antibiotics Rash    Family History: Family History  Problem Relation Age of Onset   Diabetes Father    Breast cancer Neg Hx     Social History:  reports that she has never smoked. She has never been exposed to tobacco smoke. She has never used smokeless tobacco. She reports that she does not drink alcohol and does not use drugs.  ROS: Pertinent ROS in HPI  Physical  Exam: Constitutional:  Well nourished. Alert and oriented, No acute distress. HEENT: [place] AT, moist mucus membranes.  Trachea midline Cardiovascular: No clubbing, cyanosis, or edema. Respiratory: Normal respiratory effort, no increased work of breathing. Neurologic: Grossly intact, no focal deficits, moving all 4 extremities. Psychiatric: Normal mood and affect.  Laboratory Data: Lab Results  Component Value Date   WBC 11.1 (H) 10/09/2022   HGB 9.1 (L) 10/09/2022   HCT 30.2 (L) 10/09/2022   MCV 82.3 10/09/2022   PLT 523 (H) 10/09/2022    Lab Results  Component Value Date   CREATININE 0.80 10/09/2022    Lab Results  Component Value Date   HGBA1C 5.7 (H) 08/16/2022    Lab Results  Component Value Date   AST 17 10/09/2022   Lab Results  Component Value Date   ALT 8 10/09/2022    Urinalysis    Component Value Date/Time   COLORURINE YELLOW (A) 10/09/2022 0600   APPEARANCEUR CLOUDY (A) 10/09/2022 0600   APPEARANCEUR Cloudy (A) 09/08/2018 1519   LABSPEC 1.008 10/09/2022 0600   LABSPEC 1.016 05/20/2012 1345   PHURINE 5.0 10/09/2022 0600   GLUCOSEU NEGATIVE 10/09/2022 0600   GLUCOSEU Negative 05/20/2012 1345   HGBUR SMALL (A) 10/09/2022 0600   BILIRUBINUR NEGATIVE 10/09/2022 0600   BILIRUBINUR Negative 09/08/2018 1519   BILIRUBINUR Negative 05/20/2012 1345   KETONESUR 5 (A) 10/09/2022 0600   PROTEINUR NEGATIVE 10/09/2022 0600   NITRITE NEGATIVE 10/09/2022 0600   LEUKOCYTESUR LARGE (A) 10/09/2022 0600   LEUKOCYTESUR 3+ 05/20/2012 1345  I have reviewed the labs.   Pertinent Imaging:  10/17/22 14:34  Scan Result 260   Simple Catheter Placement  Due to urinary retention patient is present today for a foley cath placement.  Patient was cleaned and prepped in a sterile fashion with betadine and 2% lidocaine jelly was instilled into the urethra. A 16  FR foley catheter was inserted, urine return was noted  300 ml, urine was yellow in color.  The balloon was filled with  10cc of sterile water.  A night bag was attached for drainage. Patient was given instruction on proper catheter care.  Patient tolerated well, no complications were noted   Performed by: Zara Council, PA-C    Assessment & Plan:    1. Urinary retention - Bladder Scan ([name] Void Residual) in office -We discussed returning first thing tomorrow morning for repeat bladder scan, instructing a family member on straight catheterization in case she goes into retention this afternoon/evening or just replacing the catheter and having another voiding trial at a later date -She would like for [place] to replace the Foley catheter  and repeat the voiding trial next week   Return in about 1 week (around 10/24/2022) for TOV .  These notes generated with voice recognition software. I apologize for typographical errors.  [place], [place] [address]  [place], [place] [address] [phone]

## 2022-10-17 NOTE — Progress Notes (Signed)
Pull Catheter Removal  Patient is present today for a catheter removal.   A 14FR foley cath was removed from the bladder no complications were noted .  Patient tolerated well.  Performed by: S.Yoko Mcgahee, CMA  Follow up/ Additional notes: return at 2pm, drink plenty of fluids

## 2022-10-20 DIAGNOSIS — M25561 Pain in right knee: Secondary | ICD-10-CM | POA: Diagnosis not present

## 2022-10-20 DIAGNOSIS — L03116 Cellulitis of left lower limb: Secondary | ICD-10-CM | POA: Diagnosis not present

## 2022-10-20 DIAGNOSIS — M47812 Spondylosis without myelopathy or radiculopathy, cervical region: Secondary | ICD-10-CM | POA: Diagnosis not present

## 2022-10-20 DIAGNOSIS — A419 Sepsis, unspecified organism: Secondary | ICD-10-CM | POA: Diagnosis not present

## 2022-10-20 DIAGNOSIS — M792 Neuralgia and neuritis, unspecified: Secondary | ICD-10-CM | POA: Diagnosis not present

## 2022-10-20 DIAGNOSIS — M5410 Radiculopathy, site unspecified: Secondary | ICD-10-CM | POA: Diagnosis not present

## 2022-10-20 DIAGNOSIS — L03115 Cellulitis of right lower limb: Secondary | ICD-10-CM | POA: Diagnosis not present

## 2022-10-20 DIAGNOSIS — M1712 Unilateral primary osteoarthritis, left knee: Secondary | ICD-10-CM | POA: Diagnosis not present

## 2022-10-20 DIAGNOSIS — E1151 Type 2 diabetes mellitus with diabetic peripheral angiopathy without gangrene: Secondary | ICD-10-CM | POA: Diagnosis not present

## 2022-10-20 DIAGNOSIS — I87313 Chronic venous hypertension (idiopathic) with ulcer of bilateral lower extremity: Secondary | ICD-10-CM | POA: Diagnosis not present

## 2022-10-20 DIAGNOSIS — I5032 Chronic diastolic (congestive) heart failure: Secondary | ICD-10-CM | POA: Diagnosis not present

## 2022-10-20 DIAGNOSIS — N1330 Unspecified hydronephrosis: Secondary | ICD-10-CM | POA: Diagnosis not present

## 2022-10-20 DIAGNOSIS — L89323 Pressure ulcer of left buttock, stage 3: Secondary | ICD-10-CM | POA: Diagnosis not present

## 2022-10-20 DIAGNOSIS — I700 Atherosclerosis of aorta: Secondary | ICD-10-CM | POA: Diagnosis not present

## 2022-10-20 DIAGNOSIS — K5732 Diverticulitis of large intestine without perforation or abscess without bleeding: Secondary | ICD-10-CM | POA: Diagnosis not present

## 2022-10-20 DIAGNOSIS — L89616 Pressure-induced deep tissue damage of right heel: Secondary | ICD-10-CM | POA: Diagnosis not present

## 2022-10-20 DIAGNOSIS — E1140 Type 2 diabetes mellitus with diabetic neuropathy, unspecified: Secondary | ICD-10-CM | POA: Diagnosis not present

## 2022-10-20 DIAGNOSIS — L97829 Non-pressure chronic ulcer of other part of left lower leg with unspecified severity: Secondary | ICD-10-CM | POA: Diagnosis not present

## 2022-10-20 DIAGNOSIS — I890 Lymphedema, not elsewhere classified: Secondary | ICD-10-CM | POA: Diagnosis not present

## 2022-10-20 DIAGNOSIS — E785 Hyperlipidemia, unspecified: Secondary | ICD-10-CM | POA: Diagnosis not present

## 2022-10-20 DIAGNOSIS — G8929 Other chronic pain: Secondary | ICD-10-CM | POA: Diagnosis not present

## 2022-10-20 DIAGNOSIS — M19049 Primary osteoarthritis, unspecified hand: Secondary | ICD-10-CM | POA: Diagnosis not present

## 2022-10-20 DIAGNOSIS — I110 Hypertensive heart disease with heart failure: Secondary | ICD-10-CM | POA: Diagnosis not present

## 2022-10-20 DIAGNOSIS — M1A9XX0 Chronic gout, unspecified, without tophus (tophi): Secondary | ICD-10-CM | POA: Diagnosis not present

## 2022-10-24 ENCOUNTER — Encounter: Payer: PPO | Attending: Internal Medicine | Admitting: Internal Medicine

## 2022-10-24 DIAGNOSIS — X58XXXA Exposure to other specified factors, initial encounter: Secondary | ICD-10-CM | POA: Diagnosis not present

## 2022-10-24 DIAGNOSIS — L97822 Non-pressure chronic ulcer of other part of left lower leg with fat layer exposed: Secondary | ICD-10-CM | POA: Diagnosis not present

## 2022-10-24 DIAGNOSIS — L89320 Pressure ulcer of left buttock, unstageable: Secondary | ICD-10-CM | POA: Insufficient documentation

## 2022-10-24 DIAGNOSIS — E11622 Type 2 diabetes mellitus with other skin ulcer: Secondary | ICD-10-CM | POA: Insufficient documentation

## 2022-10-24 DIAGNOSIS — I87312 Chronic venous hypertension (idiopathic) with ulcer of left lower extremity: Secondary | ICD-10-CM | POA: Diagnosis not present

## 2022-10-24 DIAGNOSIS — Z7401 Bed confinement status: Secondary | ICD-10-CM | POA: Diagnosis not present

## 2022-10-24 DIAGNOSIS — I890 Lymphedema, not elsewhere classified: Secondary | ICD-10-CM | POA: Insufficient documentation

## 2022-10-24 DIAGNOSIS — S91302A Unspecified open wound, left foot, initial encounter: Secondary | ICD-10-CM | POA: Diagnosis not present

## 2022-10-24 DIAGNOSIS — I110 Hypertensive heart disease with heart failure: Secondary | ICD-10-CM | POA: Diagnosis not present

## 2022-10-24 DIAGNOSIS — Z993 Dependence on wheelchair: Secondary | ICD-10-CM | POA: Insufficient documentation

## 2022-10-24 DIAGNOSIS — M86172 Other acute osteomyelitis, left ankle and foot: Secondary | ICD-10-CM | POA: Diagnosis not present

## 2022-10-24 DIAGNOSIS — L89610 Pressure ulcer of right heel, unstageable: Secondary | ICD-10-CM | POA: Insufficient documentation

## 2022-10-24 DIAGNOSIS — I5032 Chronic diastolic (congestive) heart failure: Secondary | ICD-10-CM | POA: Insufficient documentation

## 2022-10-24 NOTE — Progress Notes (Signed)
10/25/2022 4:21 PM   [name] 02/27/1941 [id number]  Referring provider: Tracie Harrier, MD [address] [facility] [place],  [place] [address]  Urological history: 1.  High risk hematuria -Non-smoker -History of microscopic hematuria with 4-10 RBC's -non-contrast CT (2019) -no malignancies -cysto (2019) -no malignancies -No complaints of gross heme -UA (09/2022) -11-20 RBCs associated with UTI and catheter placement  2. Cystocele -seen on numerous CT's -appreciated on exam   Chief Complaint  Patient presents with   Urinary Retention    HPI: [name] is a 81 y.o. female who presents today for TOV.    Foley catheter removed this a.m.  She states that she has been increasing her fluid intake and has voided 2 times with a good strong stream.  She feels that her bladder is empty at this time.  Bladder scan notes residual 44 cc.  Patient denies any modifying or aggravating factors.  Patient denies any gross hematuria, dysuria or suprapubic/flank pain.  Patient denies any fevers, chills, nausea or vomiting.     PMH: Past Medical History:  Diagnosis Date   Arthritis    Arthritis    Breast cancer ([place]) 1992   right breast with lumpectomy and rad tx   Cancer of right breast ([place]) 04/16/2013   right breast with mastectomy   Diabetes mellitus without complication ([place])    Gout    High cholesterol    Hyperlipidemia    Hypertension    Personal history of radiation therapy     Surgical History: Past Surgical History:  Procedure Laterality Date   ABDOMINAL HYSTERECTOMY     ANKLE FRACTURE SURGERY Right 2004   BREAST LUMPECTOMY Right 1992   positive   MASTECTOMY Right 2014   SHOULDER SURGERY Right 2010    Home Medications:  Allergies as of 10/25/2022       Reactions   Other Anaphylaxis   Anesthisia has been a health issue for her in the past.    Sulfa Antibiotics Rash        Medication List        Accurate as of  October 25, 2022  4:21 PM. If you have any questions, ask your nurse or doctor.          acetaminophen 500 MG tablet Commonly known as: TYLENOL Take 500-1,000 mg by mouth 4 (four) times daily as needed for mild pain or moderate pain.   allopurinol 300 MG tablet Commonly known as: ZYLOPRIM Take 300 mg by mouth daily.   atenolol 50 MG tablet Commonly known as: TENORMIN Take 50 mg by mouth daily.   atorvastatin 40 MG tablet Commonly known as: LIPITOR Take 40 mg by mouth daily.   cyanocobalamin 1000 MCG tablet Commonly known as: VITAMIN B12 Take 1,000 mcg by mouth daily.   diphenoxylate-atropine 2.5-0.025 MG tablet Commonly known as: LOMOTIL Take 1-2 tablets by mouth 3 (three) times daily as needed.   furosemide 40 MG tablet Commonly known as: LASIX Take 40 mg by mouth daily.   gentamicin cream 0.1 % Commonly known as: GARAMYCIN Apply 1 Application topically in the morning and at bedtime.   HYDROcodone-acetaminophen 7.5-325 MG tablet Commonly known as: NORCO Take 1 tablet by mouth 2 (two) times daily as needed for moderate pain or severe pain.   levothyroxine 88 MCG tablet Commonly known as: SYNTHROID Take 88 mcg by mouth daily.   lisinopril 5 MG tablet Commonly known as: ZESTRIL Take 5 mg by mouth daily.  metFORMIN 1000 MG tablet Commonly known as: GLUCOPHAGE Take 1,000 mg by mouth in the morning and at bedtime.   sitaGLIPtin 50 MG tablet Commonly known as: JANUVIA Take 50 mg by mouth daily.        Allergies:  Allergies  Allergen Reactions   Other Anaphylaxis    Anesthisia has been a health issue for her in the past.    Sulfa Antibiotics Rash    Family History: Family History  Problem Relation Age of Onset   Diabetes Father    Breast cancer Neg Hx     Social History:  reports that she has never smoked. She has never been exposed to tobacco smoke. She has never used smokeless tobacco. She reports that she does not drink alcohol and does not  use drugs.  ROS: Pertinent ROS in HPI  Physical Exam: Constitutional:  Well nourished. Alert and oriented, No acute distress. HEENT: [place] AT, moist mucus membranes.  Trachea midline, no masses. Cardiovascular: No clubbing, cyanosis, or edema. Respiratory: Normal respiratory effort, no increased work of breathing. Neurologic: Grossly intact, no focal deficits, moving all 4 extremities. Psychiatric: Normal mood and affect.    Laboratory Data: N/A  Pertinent Imaging:  10/25/22 16:13  Scan Result 44 ml     Assessment & Plan:    1. Urinary retention -Resolved for the time being -reviewed return precautions   2. Microscopic hematuria -will recheck UA upon return in one month  Return in about 1 month (around 11/25/2022) for PVR and OAB questionnaire.  These notes generated with voice recognition software. I apologize for typographical errors.  [place], [place] [address]  [place], [place] [address] [phone]

## 2022-10-25 ENCOUNTER — Ambulatory Visit (INDEPENDENT_AMBULATORY_CARE_PROVIDER_SITE_OTHER): Payer: PPO | Admitting: Urology

## 2022-10-25 ENCOUNTER — Ambulatory Visit: Payer: PPO | Admitting: Urology

## 2022-10-25 DIAGNOSIS — R339 Retention of urine, unspecified: Secondary | ICD-10-CM

## 2022-10-25 DIAGNOSIS — R3129 Other microscopic hematuria: Secondary | ICD-10-CM | POA: Diagnosis not present

## 2022-10-25 LAB — BLADDER SCAN AMB NON-IMAGING: Scan Result: 44

## 2022-10-25 NOTE — Progress Notes (Signed)
Catheter Removal  Patient is present today for a catheter removal.  43m of water was drained from the balloon. A 16FR foley cath was removed from the bladder, no complications were noted. Patient tolerated well.  Performed by: JGaspar ColaCMA  Follow up/ Additional notes: This afternoon

## 2022-10-26 ENCOUNTER — Encounter: Payer: Self-pay | Admitting: Urology

## 2022-10-29 DIAGNOSIS — I872 Venous insufficiency (chronic) (peripheral): Secondary | ICD-10-CM | POA: Diagnosis not present

## 2022-10-29 DIAGNOSIS — L89610 Pressure ulcer of right heel, unstageable: Secondary | ICD-10-CM | POA: Diagnosis not present

## 2022-10-29 DIAGNOSIS — L89323 Pressure ulcer of left buttock, stage 3: Secondary | ICD-10-CM | POA: Diagnosis not present

## 2022-10-30 NOTE — Progress Notes (Signed)
[name], [name] ([id number]) [id number] Page 1 of 11 Visit Report for 10/24/2022 Arrival Information Details Patient Name: Date of Service: [name] [name]. 10/24/2022 3:00 PM Medical Record Number: [id number] Patient Account Number: [id number] Date of Birth/Sex: Treating RN: [birth year] (81 y.o. [name] Primary Care Provider: Tracie Harrier Other Clinician: Massie Kluver Referring Provider: Treating Provider/Extender: Arn Medal Weeks in Treatment: 4 Visit Information History Since Last Visit All ordered tests and consults were completed: No Patient Arrived: Wheel Chair Added or deleted any medications: No Arrival Time: 15:17 Any new allergies or adverse reactions: No Transfer Assistance: EasyPivot Patient Lift Had a fall or experienced change in No Patient Identification Verified: Yes activities of daily living that may affect Secondary Verification Process Completed: Yes risk of falls: Patient Requires Transmission-Based Precautions: No Signs or symptoms of abuse/neglect since last visito No Patient Has Alerts: Yes Hospitalized since last visit: No Patient Alerts: DM II Implantable device outside of the clinic excluding No ABI R 1.30 TBI 1.12 cellular tissue based products placed in the [name] ABI L 1.27 TBI 1.21 since last visit: Has Dressing in Place as Prescribed: Yes Pain Present Now: Yes Electronic Signature(s) Signed: 10/30/2022 4:44:06 PM By: Massie Kluver Entered By: Massie Kluver on 10/24/2022 15:20:15 -------------------------------------------------------------------------------- Clinic Level of Care Assessment Details Patient Name: Date of Service: [name], [name] [name]. 10/24/2022 3:00 PM Medical Record Number: [id number] Patient Account Number: [id number] Date of Birth/Sex: Treating RN: 1941-02-22 (81 y.o. [name] Primary Care Provider: Tracie Harrier Other Clinician:  Massie Kluver Referring Provider: Treating Provider/Extender: Arn Medal Weeks in Treatment: 4 Clinic Level of Care Assessment Items TOOL 1 Quantity Score [] - 0 Use when EandM and Procedure is performed on INITIAL visit ASSESSMENTS - Nursing Assessment / Reassessment [] - 0 General Physical Exam (combine w/ comprehensive assessment (listed just below) when performed on new pt. evals) [] - 0 Comprehensive Assessment (HX, ROS, Risk Assessments, Wounds Hx, etc.) [name], [name] ([id number]) [id number] Page 2 of 11 ASSESSMENTS - Wound and Skin Assessment / Reassessment [] - 0 Dermatologic / Skin Assessment (not related to wound area) ASSESSMENTS - Ostomy and/or Continence Assessment and Care [] - 0 Incontinence Assessment and Management [] - 0 Ostomy Care Assessment and Management (repouching, etc.) PROCESS - Coordination of Care [] - 0 Simple Patient / Family Education for ongoing care [] - 0 Complex (extensive) Patient / Family Education for ongoing care [] - 0 Staff obtains Programmer, systems, Records, T Results / Process Orders est [] - 0 Staff telephones HHA, Nursing Homes / Clarify orders / etc [] - 0 Routine Transfer to another Facility (non-emergent condition) [] - 0 Routine [name] Admission (non-emergent condition) [] - 0 New Admissions / Biomedical engineer / Ordering NPWT Apligraf, etc. , [] - 0 Emergency [name] Admission (emergent condition) PROCESS - Special Needs [] - 0 Pediatric / Minor Patient Management [] - 0 Isolation Patient Management [] - 0 Hearing / Language / Visual special needs [] - 0 Assessment of Community assistance (transportation, D/C planning, etc.) [] - 0 Additional assistance / Altered mentation [] - 0 Support Surface(s) Assessment (bed, cushion, seat, etc.) INTERVENTIONS - Miscellaneous [] - 0 External ear exam [] - 0 Patient Transfer (multiple staff / Civil Service fast streamer / Similar  devices) [] - 0 Simple Staple / Suture removal (25 or less) [] - 0 Complex Staple / Suture removal (26 or more) [] - 0 Hypo/Hyperglycemic Management (do not check if billed separately) [] -  0 Ankle / Brachial Index (ABI) - do not check if billed separately Has the patient been seen at the [name] within the last three years: Yes Total Score: 0 Level Of Care: ____ Electronic Signature(s) Signed: 10/30/2022 4:44:06 PM By: Massie Kluver Entered By: Massie Kluver on 10/24/2022 16:01:13 -------------------------------------------------------------------------------- Encounter Discharge Information Details Patient Name: Date of Service: [name], [name] [name]. 10/24/2022 3:00 PM Medical Record Number: [id number] Patient Account Number: [id number] Date of Birth/Sex: Treating RN: [birth year] (81 y.o. [name] Primary Care [name]: Tracie Harrier Other Clinician: Massie Kluver Referring [name]: Treating Ahnika Hannibal/Extender: Arn Medal [name], [name] ([id number]) [id number] Page 3 of 11 Weeks in Treatment: 4 Encounter Discharge Information Items Post Procedure Vitals Discharge Condition: Stable Temperature (F): 98.3 Ambulatory Status: Wheelchair Pulse (bpm): 93 Discharge Destination: Home Respiratory Rate (breaths/min): 16 Transportation: Private Auto Blood Pressure (mmHg): 128/82 Accompanied By: son Schedule Follow-up Appointment: Yes Clinical Summary of Care: Electronic Signature(s) Signed: 10/30/2022 4:44:06 PM By: Massie Kluver Entered By: Massie Kluver on 10/24/2022 17:13:47 -------------------------------------------------------------------------------- Lower Extremity Assessment Details Patient Name: Date of Service: [name], [name] [name]. 10/24/2022 3:00 PM Medical Record Number: [id number] Patient Account Number: [id number] Date of Birth/Sex: Treating RN: 1941/06/18 (81 y.o. [name] Primary  Care Braidyn Peace: Tracie Harrier Other Clinician: Massie Kluver Referring Bohden Dung: Treating Mariesha Venturella/Extender: Arn Medal Weeks in Treatment: 4 Electronic Signature(s) Signed: 10/24/2022 4:26:00 PM By: Rosalio Loud MSN RN CNS WTA Signed: 10/30/2022 4:44:06 PM By: Massie Kluver Entered By: Massie Kluver on 10/24/2022 15:38:57 -------------------------------------------------------------------------------- Multi Wound Chart Details Patient Name: Date of Service: [name], [name] [name]. 10/24/2022 3:00 PM Medical Record Number: [id number] Patient Account Number: [id number] Date of Birth/Sex: Treating RN: Jan 10, 1941 (81 y.o. [name] Primary Care Brenten Janney: Tracie Harrier Other Clinician: Massie Kluver Referring Cherisa Brucker: Treating Cassy Sprowl/Extender: Arn Medal Weeks in Treatment: 4 Vital Signs Height(in): 59 Pulse(bpm): 93 Weight(lbs): 116 Blood Pressure(mmHg): 128/82 Body Mass Index(BMI): 23.4 Temperature(F): 98.3 Respiratory Rate(breaths/min): 16 [Amos, Madilyn L ([id number]):Photos: Wound Location: Wounding Event: Primary Etiology: Comorbid History: Date Acquired: Weeks of Treatment: Wound Status: Wound Recurrence: Measurements L x W x D (cm) A (cm) : rea Volume (cm) : % Reduction in A rea:  % Reduction in Volume: Starting Position 1 (o'clock): Ending Position 1 (o'clock): Maximum Distance 1 (cm): Undermining: Classification: Exudate A mount: Exudate Type: Exudate Color: Foul Odor A Cleansing: Odor A nticipated Due to Product Use:  Granulation A mount: Necrotic A mount: Necrotic Tissue: Exposed Structures: Epithelialization:] [[id number] Page 4 of 11:6 8 N/A No Photos No Photos N/A Right Calcaneus Left, Distal Gluteus N/A Pressure Injury Pressure Injury  N/A Pressure Ulcer Pressure Ulcer N/A Cataracts, Lymphedema, Cataracts, Lymphedema, N/A Hypertension, Peripheral Arterial Hypertension,  Peripheral Arterial Disease, Peripheral Venous Disease, Disease, Peripheral Venous Disease, Type II Diabetes,  Osteoarthritis, Type II Diabetes, Osteoarthritis, Neuropathy Neuropathy 07/23/2022 07/23/2022 N/A 4 4 N/A Open Open N/A No No N/A 3.4x2.5x0.2 2.5x3.5x1.1 N/A 6.676 6.872 N/A 1.335 7.559 N/A 29.20% -25.00% N/A 52.80% -358.40% N/A 10 5 1.2 No Yes N/A  Unstageable/Unclassified Unstageable/Unclassified N/A Medium Medium N/A Serosanguineous Serosanguineous N/A red, brown red, brown N/A Yes Yes N/A fter No No N/A None Present (0%) None Present (0%) N/A Large (67-100%) Large (67-100%) N/A Eschar Adherent  Slough N/A Fascia: No Fascia: No N/A Fat Layer (Subcutaneous Tissue): No Fat Layer (Subcutaneous Tissue): No Tendon: No Tendon: No Muscle: No Muscle: No Joint: No Joint: No Bone: No Bone: No None None N/A] Treatment Notes Electronic Signature(s)  Signed: 10/30/2022 4:44:06 PM By: Massie Kluver Entered By: Massie Kluver on 10/24/2022 15:39:00 -------------------------------------------------------------------------------- Multi-Disciplinary Care Plan Details Patient Name: Date of Service: [name], [name] [name]. 10/24/2022 3:00 PM Medical Record Number: [id number] Patient Account Number: [id number] Date of Birth/Sex: Treating RN: August 08, 1941 (81 y.o. [name] Primary Care Tanishi Nault: Tracie Harrier Other Clinician: Massie Kluver Referring Sheldon Amara: Treating Aariah Godette/Extender: Arn Medal Weeks in Treatment: 4 Active Inactive Electronic Signature(s) Signed: 10/25/2022 5:27:53 PM By: Rosalio Loud MSN RN CNS WTA Signed: 10/30/2022 4:44:06 PM By: Lester Kinsman, [name] ([id number]) [id number] Page 5 of 11 Entered By: Massie Kluver on 10/24/2022 17:12:23 -------------------------------------------------------------------------------- Pain Assessment Details Patient Name: Date of Service: [name] [place], [name] [name]. 10/24/2022  3:00 PM Medical Record Number: [id number] Patient Account Number: [id number] Date of Birth/Sex: Treating RN: [birth year] (81 y.o. [name] Primary Care Ireoluwa Grant: Tracie Harrier Other Clinician: Massie Kluver Referring Leva Baine: Treating Vick Filter/Extender: Arn Medal Weeks in Treatment: 4 Active Problems Location of Pain Severity and Description of Pain Patient Has Paino Yes Site Locations Pain Location: Pain in Ulcers Duration of the Pain. Constant / Intermittento Constant Rate the pain. Current Pain Level: 7 Character of Pain Describe the Pain: Sharp Pain Management and Medication Current Pain Management: Medication: Yes Cold Application: No Rest: Yes Massage: No Activity: No T.E.N.S.: No Heat Application: No Leg drop or elevation: No Is the Current Pain Management Adequate: Inadequate How does your wound impact your activities of daily livingo Sleep: No Bathing: No Appetite: No Relationship With Others: No Bladder Continence: No Emotions: No Bowel Continence: No Work: No Toileting: No Drive: No Dressing: No Hobbies: No Electronic Signature(s) Signed: 10/24/2022 4:26:00 PM By: Rosalio Loud MSN RN CNS WTA Signed: 10/30/2022 4:44:06 PM By: Massie Kluver Entered By: Massie Kluver on 10/24/2022 15:24:19 [place], [name] ([id number]) [id number] Page 6 of 11 -------------------------------------------------------------------------------- Patient/Caregiver Education Details Patient Name: Date of Service: [name], [name] [name]. 12/13/2023andnbsp3:00 PM Medical Record Number: [id number] Patient Account Number: [id number] Date of Birth/Gender: Treating RN: January 28, 1941 (82 y.o. [name] Primary Care Physician: Tracie Harrier Other Clinician: Massie Kluver Referring Physician: Treating Physician/Extender: Arn Medal Weeks in Treatment: 4 Education Assessment Education  Provided To: Patient and Caregiver Education Topics Provided Pressure: Handouts: Pressure Ulcers: Care and Offloading, Other: recommend Prevalon heel protector Methods: Explain/Verbal, Printed Responses: State content correctly Electronic Signature(s) Signed: 10/30/2022 4:44:06 PM By: Massie Kluver Entered By: Massie Kluver on 10/24/2022 17:12:20 -------------------------------------------------------------------------------- Wound Assessment Details Patient Name: Date of Service: [name], [name] [name]. 10/24/2022 3:00 PM Medical Record Number: [id number] Patient Account Number: [id number] Date of Birth/Sex: Treating RN: August 01, 1941 (81 y.o. [name] Primary Care Corinne Goucher: Tracie Harrier Other Clinician: Massie Kluver Referring Kalyan Barabas: Treating Yamari Ventola/Extender: Arn Medal Weeks in Treatment: 4 Wound Status Wound Number: 10 Primary Pressure Ulcer Etiology: Wound Location: Left Metatarsal head first Wound Open Wounding Event: Pressure Injury Status: Date Acquired: 10/23/2022 Comorbid Cataracts, Lymphedema, Hypertension, Peripheral Arterial Disease, Weeks Of Treatment: 0 History: Peripheral Venous Disease, Type II Diabetes, Osteoarthritis, Clustered Wound: No Neuropathy Wound Measurements Length: (cm) 2 Width: (cm) 2.5 Depth: (cm) 0.2 Area: (cm) 3.927 Hege, Kenyatta L ([id number]) Volume: (cm) 0.785 % Reduction in Area: % Reduction in Volume: Epithelialization: None Tunneling: No [id number] Page 7 of 11 Undermining: No Wound Description Classification: Category/Stage IV Wound Margin: Flat and Intact Exudate Amount: Large Exudate Type: Serosanguineous Exudate Color: red, brown Foul Odor After Cleansing: No Slough/Fibrino No Wound Bed Granulation Amount: None  Present (0%) Exposed Structure Necrotic Amount: Medium (34-66%) Fascia Exposed: No Necrotic Quality: Bone Fat Layer (Subcutaneous  Tissue) Exposed: No Tendon Exposed: No Muscle Exposed: No Joint Exposed: No Bone Exposed: Yes Treatment Notes Wound #10 (Metatarsal head first) Wound Laterality: Left Cleanser Dakin 16 (oz) 0.25 Discharge Instruction: Use as directed. Soap and Water Discharge Instruction: Gently cleanse wound with antibacterial soap, rinse and pat dry prior to dressing wounds Peri-Wound Care Topical Primary Dressing Gauze Discharge Instruction: moistened with Dakins Solution Secondary Dressing ABD Pad 5x9 (in/in) Discharge Instruction: Cover with ABD pad Secured With Medipore T - 40M Medipore H Soft Cloth Surgical T ape ape, 2x2 (in/yd) Compression Wrap Compression Stockings Add-Ons Electronic Signature(s) Signed: 10/24/2022 4:26:00 PM By: Rosalio Loud MSN RN CNS WTA Signed: 10/30/2022 4:44:06 PM By: Massie Kluver Entered By: Massie Kluver on 10/24/2022 15:59:20 -------------------------------------------------------------------------------- Wound Assessment Details Patient Name: Date of Service: [name], [name] [name]. 10/24/2022 3:00 PM Medical Record Number: [id number] Patient Account Number: [id number] Date of Birth/Sex: Treating RN: [birth year] (81 y.o. [name] Primary Care [place] Millon: Tracie Harrier Other Clinician: Massie Kluver Referring Teleshia Lemere: Treating Jiyaan Steinhauser/Extender: Arn Medal Weeks in Treatment: [address] [name] ([id number]) [id number] Page 8 of 11 Wound Status Wound Number: 6 Primary Pressure Ulcer Etiology: Wound Location: Right Calcaneus Wound Open Wounding Event: Pressure Injury Status: Date Acquired: 07/23/2022 Comorbid Cataracts, Lymphedema, Hypertension, Peripheral Arterial Disease, Weeks Of Treatment: 4 History: Peripheral Venous Disease, Type II Diabetes, Osteoarthritis, Clustered Wound: No Neuropathy Photos Wound Measurements Length: (cm) 3.4 Width: (cm) 2.5 Depth: (cm) 0.2 Area: (cm)  6.676 Volume: (cm) 1.335 % Reduction in Area: 29.2% % Reduction in Volume: 52.8% Epithelialization: None Tunneling: No Undermining: No Wound Description Classification: Unstageable/Unclassified Exudate Amount: Medium Exudate Type: Serosanguineous Exudate Color: red, brown Foul Odor After Cleansing: Yes Due to Product Use: No Slough/Fibrino Yes Wound Bed Granulation Amount: None Present (0%) Exposed Structure Necrotic Amount: Large (67-100%) Fascia Exposed: No Necrotic Quality: Eschar Fat Layer (Subcutaneous Tissue) Exposed: No Tendon Exposed: No Muscle Exposed: No Joint Exposed: No Bone Exposed: No Treatment Notes Wound #6 (Calcaneus) Wound Laterality: Right Cleanser [place]ram Ancillary Kit - 15 Day Supply Discharge Instruction: Use supplies as instructed; Kit contains: (15) Saline Bullets; (15) 3x3 Gauze; 15 pr Gloves Dakin 16 (oz) 0.25 Discharge Instruction: Use as directed. Soap and Water Discharge Instruction: Gently cleanse wound with antibacterial soap, rinse and pat dry prior to dressing wounds Peri-Wound Care Topical Activon Honey Gel, 25 (g) Tube Primary Dressing Silvercel 4 1/4x 4 1/4 (in/in) Discharge Instruction: Apply Silvercel 4 1/4x 4 1/4 (in/in) as instructed Secondary Dressing ABD Pad 5x9 (in/in) Discharge Instruction: Cover with ABD pad Kerlix 4.5 x 4.1 (in/yd) Discharge Instruction: Apply Kerlix 4.5 x 4.1 (in/yd) as instructed Secured With ZEYA, BALLES L ([id number]) [id number] Page 9 of [place] Medipore H Soft Cloth Surgical T ape ape, 2x2 (in/yd) Compression Wrap Compression Stockings Add-Ons Electronic Signature(s) Signed: 10/24/2022 4:26:00 PM By: Rosalio Loud MSN RN CNS WTA Signed: 10/30/2022 4:44:06 PM By: Massie Kluver Entered By: Massie Kluver on 10/24/2022 15:40:23 -------------------------------------------------------------------------------- Wound Assessment Details Patient Name: Date of  Service: [name], [name] [name]. 10/24/2022 3:00 PM Medical Record Number: [id number] Patient Account Number: [id number] Date of Birth/Sex: Treating RN: [birth year] (81 y.o. [name] Primary Care Lakya Schrupp: Tracie Harrier Other Clinician: Massie Kluver Referring Navaya Wiatrek: Treating Taytem Ghattas/Extender: Arn Medal Weeks in Treatment: 4 Wound Status Wound Number: 8 Primary Pressure Ulcer Etiology: Wound Location: Left, Distal Gluteus Wound  Open Wounding Event: Pressure Injury Status: Date Acquired: 07/23/2022 Comorbid Cataracts, Lymphedema, Hypertension, Peripheral Arterial Disease, Weeks Of Treatment: 4 History: Peripheral Venous Disease, Type II Diabetes, Osteoarthritis, Clustered Wound: No Neuropathy Photos Wound Measurements Length: (cm) 2.5 Width: (cm) 3.5 Depth: (cm) 1.1 Area: (cm) 6.872 Volume: (cm) 7.559 % Reduction in Area: -25% % Reduction in Volume: -358.4% Epithelialization: None Tunneling: No Undermining: Yes Starting Position (o'clock): 10 Ending Position (o'clock): 5 Maximum Distance: (cm) 1.2 Wound Description Classification: Unstageable/Unclassified Exudate Amount: Medium Exudate Type: Serosanguineous Exudate Color: red, brown Foul Odor After Cleansing: Yes Due to Product Use: No Slough/Fibrino Yes Wound Bed Wargo, Canna L ([id number]) [id number] Page 10 of 11 Granulation Amount: None Present (0%) Exposed Structure Necrotic Amount: Large (67-100%) Fascia Exposed: No Necrotic Quality: Adherent Slough Fat Layer (Subcutaneous Tissue) Exposed: No Tendon Exposed: No Muscle Exposed: No Joint Exposed: No Bone Exposed: No Treatment Notes Wound #8 (Gluteus) Wound Laterality: Left, Distal Cleanser Dakin 16 (oz) 0.25 Discharge Instruction: Use as directed. Soap and Water Discharge Instruction: Gently cleanse wound with antibacterial soap, rinse and pat dry prior to dressing wounds Peri-Wound  Care Topical Primary Dressing Gauze Discharge Instruction: moistened with Dakins Solution Secondary Dressing ABD Pad 5x9 (in/in) Discharge Instruction: Cover with ABD pad Secured With Medipore T - 21M Medipore H Soft Cloth Surgical T ape ape, 2x2 (in/yd) Compression Wrap Compression Stockings Add-Ons Electronic Signature(s) Signed: 10/24/2022 4:26:00 PM By: Rosalio Loud MSN RN CNS WTA Signed: 10/30/2022 4:44:06 PM By: Massie Kluver Entered By: Massie Kluver on 10/24/2022 15:40:49 -------------------------------------------------------------------------------- Wound Assessment Details Patient Name: Date of Service: [name], [name] [name]. 10/24/2022 3:00 PM Medical Record Number: [id number] Patient Account Number: [id number] Date of Birth/Sex: Treating RN: 02/27/1941 (81 y.o. [name] Primary Care Jamarian Jacinto: Tracie Harrier Other Clinician: Massie Kluver Referring Zarius Furr: Treating Gautam Langhorst/Extender: Arn Medal Weeks in Treatment: 4 Wound Status Wound Number: 9 Primary Pressure Ulcer Etiology: Wound Location: Left, Medial Foot Wound Open Wounding Event: Pressure Injury Status: Date Acquired: 10/23/2022 Comorbid Cataracts, Lymphedema, Hypertension, Peripheral Arterial Disease, Weeks Of Treatment: 0 History: Peripheral Venous Disease, Type II Diabetes, Osteoarthritis, Clustered Wound: No Neuropathy Wound Measurements Hem, Kwanza L ([id number]) Length: (cm) 2 Width: (cm) 2 Depth: (cm) 0.2 Area: (cm) 3.142 Volume: (cm) 0.628 [id number] Page 11 of 11 % Reduction in Area: % Reduction in Volume: Epithelialization: None Tunneling: No Undermining: No Wound Description Classification: Category/Stage IV Wound Margin: Flat and Intact Exudate Amount: Large Exudate Type: Serosanguineous Exudate Color: red, brown Foul Odor After Cleansing: No Slough/Fibrino Yes Wound Bed Granulation Amount: None Present  (0%) Exposed Structure Necrotic Amount: Small (1-33%) Fascia Exposed: No Necrotic Quality: Bone Fat Layer (Subcutaneous Tissue) Exposed: Yes Tendon Exposed: No Muscle Exposed: No Joint Exposed: No Bone Exposed: Yes Electronic Signature(s) Signed: 10/24/2022 4:26:00 PM By: Rosalio Loud MSN RN CNS WTA Signed: 10/30/2022 4:44:06 PM By: Massie Kluver Entered By: Massie Kluver on 10/24/2022 15:56:33 -------------------------------------------------------------------------------- Vitals Details Patient Name: Date of Service: [place] [name], [name] [name]. 10/24/2022 3:00 PM Medical Record Number: [id number] Patient Account Number: [id number] Date of Birth/Sex: Treating RN: Jul 08, 1941 (81 y.o. [name] Primary Care Amanda Pote: Tracie Harrier Other Clinician: Massie Kluver Referring Sanyla Summey: Treating Sheneka Schrom/Extender: Arn Medal Weeks in Treatment: 4 Vital Signs Time Taken: 15:20 Temperature (F): 98.3 Height (in): 59 Pulse (bpm): 93 Weight (lbs): 116 Respiratory Rate (breaths/min): 16 Body Mass Index (BMI): 23.4 Blood Pressure (mmHg): 128/82 Reference Range: 80 - 120 mg / dl Electronic Signature(s) Signed: 10/30/2022 4:44:06 PM By: Clifton James,  Angie Entered By: Massie Kluver on 10/24/2022 15:24:16

## 2022-10-30 NOTE — Progress Notes (Signed)
RICKITA, FORSTNER ([id number]) [id number] Page 1 of 10 Visit Report for 10/24/2022 Chief Complaint Document Details Patient Name: Date of Service: [facility], [name]. 10/24/2022 3:00 PM Medical Record Number: [id number] Patient Account Number: [id number] Date of Birth/Sex: Treating RN: Mar 22, 1941 (81 y.o. [name] Primary Care Provider: Tracie [name] Other Clinician: Massie [name] Referring Provider: Treating Provider/Extender: [name] in Treatment: 4 Information Obtained from: Patient Chief Complaint 09/26/2022; left lower extremity wound, right heel wound and left buttocks wound Electronic Signature(s) Signed: 10/24/2022 4:20:53 PM By: Kalman Shan DO Entered By: Kalman Shan on 10/24/2022 16:02:59 -------------------------------------------------------------------------------- Debridement Details Patient Name: Date of Service: [facility], [name]. 10/24/2022 3:00 PM Medical Record Number: [id number] Patient Account Number: [id number] Date of Birth/Sex: Treating RN: May 16, 1941 (81 y.o. [name] Primary Care Provider: Tracie [name] Other Clinician: Massie [name] Referring Provider: Treating Provider/Extender: [name] in Treatment: 4 Debridement Performed for Assessment: Wound #6 Right Calcaneus Performed By: Physician Kalman Shan, MD Debridement Type: Debridement Level of Consciousness (Pre-procedure): Awake and Alert Pre-procedure Verification/Time Out Yes - 15:48 Taken: Start Time: 15:48 T Area Debrided ([name] x W): otal 1 (cm) x 1 (cm) = 1 (cm) Tissue and other material debrided: Viable, Non-Viable Level: Non-Viable Tissue Debridement Description: Selective/Open Wound Instrument: Blade, Forceps Bleeding: Minimum Hemostasis Achieved: Pressure Response to Treatment: Procedure was tolerated well Level of Consciousness (Post- Awake and  Alert procedure): Post Debridement Measurements of Total Wound [name] ([id number]) [id number] Page 2 of 10 Length: (cm) 3.4 Stage: Unstageable/Unclassified Width: (cm) 2.5 Depth: (cm) 0.2 Volume: (cm) 1.335 Character of Wound/Ulcer Post Debridement: Stable Post Procedure Diagnosis Same as Pre-procedure Electronic Signature(s) Signed: 10/24/2022 4:20:53 PM By: Kalman Shan DO Signed: 10/24/2022 4:26:00 PM By: Rosalio Loud MSN RN CNS WTA Signed: 10/30/2022 4:44:06 PM By: [name] Entered By: [name] on 10/24/2022 15:49:39 -------------------------------------------------------------------------------- HPI Details Patient Name: Date of Service: [facility], [name]. 10/24/2022 3:00 PM Medical Record Number: [id number] Patient Account Number: [id number] Date of Birth/Sex: Treating RN: 03-26-1941 (81 y.o. [name] Primary Care Provider: Tracie [name] Other Clinician: Massie [name] Referring Provider: Treating Provider/Extender: [name] in Treatment: 4 History of Present Illness HPI Description: 81 year old patient who comes with a referral for bilateral lower extremity edema and a lower extremity ulceration and has been sent by her PCP Dr. Placido [name]. I understand the patient was recently put on amoxicillin and doxycycline but could not tolerate the amoxicillin. doxycycline course was completed. a BNP and EKG was supposed to be normal and the patient did not have any dyspnea. the patient has been on a diuretic. The patient was also prescribed a pair of elastic compression stockings of the 20-30 mmHg pressure variety. x-ray of the right ankle was done on 09/20/2015 and showed posttraumatic and postsurgical changes of the right ankle with secondary degenerative changes of the tibiotalar joint and to a lesser degree the subtalar joint. No definite acute bony abnormalities are  noted. Past medical history significant for diabetes mellitus, hypertension, hyperlipidemia, right breast cancer treated with a mastectomy in 2014. She has never smoked. 10/24/2015 -- she had delayed her vascular test because of her husband surgery but she is now ready to get him taken care of. He is also unable to use compression stockings and hence we will need to order her Juzo wraps. 10/31/2015-- was seen by [name] on 10/28/2015. She had a left lower extremity arterial duplex done at  his office a couple of years ago and that was essentially normal. Today they performed a venous duplex which revealed no evidence of deep vein thrombosis, superficial thrombophlebitis, no venous reflex was seen on the right and a minimal amount of reflux was seen on the left great saphenous vein but no significant reflux was seen. Impression was that there was a component of lymphedema present from a previous surgery and he would recommend compression stockings and leg elevation. Readmission: 07-24-2022 upon evaluation today patient presents for initial inspection here in our clinic concerning issues that she has been having with her legs this is actually been going on for several years according to what her family member with her today tells me as well as what the patient reiterates as well. She is currently most recently been seeing [name] and subsequently he had her in [place] boots. However 2 [name] ago he referred her to [place] and then subsequently took her out of the [place] boot wraps at that point. At this time the left leg looks to be worse in the right leg currently. She is on Lasix and lisinopril with hydrochlorothiazide she has high blood pressure she also has issues currently with lower extremity swelling and edema which has been an ongoing issue for her as well. Patient does have a history of chronic venous hypertension, lymphedema, diabetes mellitus type 2, hypertension, peripheral vascular disease,  and neuropathy. Currently she is on Lasix as well as lisinopril with hydrochlorothiazide. 08-02-2022 upon evaluation today patient presents for reevaluation the good news is she is actually doing somewhat better in regard to the wound and the overall appearance and sinuses. The unfortunate thing is her infection really is not significantly improved we did have to switch out her antibiotic once we got that final result back and I switched her to Levaquin and away from the doxycycline. Unfortunately the doxycycline had been doing poorly for her. In fact she had had diarrhea from the time she started taking it on Friday and she is still having it when she shows up today for evaluation. Again I was not aware of this and obviously she does appear to be somewhat dehydrated as well based on what I see. My concern which I discussed with the patient today is the possibility of a C. difficile infection. With that being said fact this started immediately upon taking the doxycycline makes me think that it was just the medicine and is not completely out of her system yet despite having taken the last dose Tuesday morning. Nonetheless with what we are seeing currently I want to be sure that reason I Minna contact her primary care provider and see if a would be willing to see her and test for C. difficile infection. 08-07-2022 upon evaluation today patient appears to be doing well currently in regard to her wounds which are actually measuring much better this is great news. Fortunately I do not see any signs of active infection locally or systemically at this time which is great as well. The good news is she was tested for a C. difficile infection and it was negative. I am very pleased and thankful for primary care provider for doing that so this means that she was just having a severe Carsey, Kaylor [name] ([id number]) [id number] Page 3 of 10 reaction to the doxycycline we have added that to  her allergy list at this point. 08-14-2022 upon evaluation today patient appears to be doing well currently in regard to her dehydration she is  actually significantly improved compared to last time I saw her last week. With that being said I do not see any evidence of active infection systemically at this time which is great news. However locally she still does appear to have cellulitis in left lower extremity. I discussed with her today that I do believe she would benefit from going ahead and starting the [place]. Again we will concerned about the C. difficile infection of the diarrhea but that this turned out to be just an issue with a reaction to the doxycycline. My hope is that the Levaquin will not cause her any complication and will be able to treat the infection. I did review her arterial study as well which was dated on 07-31-2022. It showed that she had a ABI on the right of 1.30 and on the left of 1.27 with a TBI on the right of 1.12 and the left 1.21 this is a normal arterial study. Again on the patient's wound culture she actually did show evidence of Proteus, Morganella, and MRSA. Levaquin is a good option here across the board. 09/26/2022 Ms. Diane Pettibone is a 81 year old female with a past medical history of type 2 diabetes currently controlled on oral agents, venous insufficiency/lymphedema, right breast cancer and chronic diastolic heart failure that presents to the clinic for a 1 month history of nonhealing wounds to the left lower extremity, right heel and left buttocks. She states that the buttocks wound and the right heel wound developed while she was in the hospital. She was admitted on 08/22/2022 for severe sepsis secondary to acute sigmoid and distal colonic diverticulitis. At that time it was noted she had a stage I decubitus ulcer to her bilateral buttocks. A wound to the heel was not mentioned. She currently denies systemic signs of infection. She came into clinic in a blue  gown with no undergarments. She has not been dressing the wounds. She states she just recently obtained home health and they are coming out for the first time this week. She is seen in our clinic often for lower extremity wounds secondary to venous insufficiency. 11/22; patient presents for follow-up. Son is present during the encounter. Per son it sounds like they are not doing any dressing changes. She states she has home health but they have not come out. She is going to let us know which home health agency she has been approved for so we can send orders. Currently she denies signs of infection. 12/13; patient presents for follow-up. Patient has home health and they are coming out once a week. It appears that there has not been any dressing changes except for with home health. Patient has a newly discovered wound to the left foot. There is exposed bone. There is slight erythema and increased warmth to the surrounding tissue. Patient is completely unaware of this wound. Electronic Signature(s) Signed: 10/24/2022 4:20:53 PM By: Kalman Shan DO Entered By: Kalman Shan on 10/24/2022 16:04:32 -------------------------------------------------------------------------------- Physical Exam Details Patient Name: Date of Service: [facility], [name]. 10/24/2022 3:00 PM Medical Record Number: [id number] Patient Account Number: [id number] Date of Birth/Sex: Treating RN: [birth year] (81 y.o. [name] Primary Care Provider: Tracie [name] Other Clinician: Massie [name] Referring Provider: Treating Provider/Extender: [name] in Treatment: 4 Constitutional . Cardiovascular . Psychiatric . Notes T the right heel there is an open wound with nonviable tissue and granulation tissue. T the left buttocks there is a large open wound with nonviable tissue and o o granulation  tissue. T the left foot first metatarsal there is an open wound with exposed  bone. Slight erythema and increased warmth to the surrounding o periwound Electronic Signature(s) Signed: 10/24/2022 4:20:53 PM By: Kalman Shan DO Entered By: Kalman Shan on 10/24/2022 16:05:23 [name], [name] ([id number]) [id number] Page 4 of 10 -------------------------------------------------------------------------------- Physician Orders Details Patient Name: Date of Service: [facility], [name]. 10/24/2022 3:00 PM Medical Record Number: [id number] Patient Account Number: [id number] Date of Birth/Sex: Treating RN: 15-Aug-1941 (81 y.o. [name] Primary Care Provider: Tracie [name] Other Clinician: Massie [name] Referring Provider: Treating Provider/Extender: [name] in Treatment: 4 Verbal / Phone Orders: No Diagnosis Coding Follow-up Appointments Return Appointment in 1 week. [place] for wound care. May utilize formulary equivalent dressing for wound treatment orders unless otherwise specified. Home Health Nurse may visit PRN to address patients wound care needs. Alvis [name] [phone] Bathing/ Shower/ Hygiene Wash wounds with antibacterial soap and water. Wound Treatment Wound #10 - Metatarsal head first Wound Laterality: Left Cleanser: Dakin 16 (oz) 0.25 (Generic) 1 x Per Day/30 Days Discharge Instructions: Use as directed. Cleanser: Soap and Water 1 x Per Day/30 Days Discharge Instructions: Gently cleanse wound with antibacterial soap, rinse and pat dry prior to dressing wounds Prim Dressing: Gauze 1 x Per Day/30 Days ary Discharge Instructions: moistened with Dakins Solution Secondary Dressing: ABD Pad 5x9 (in/in) 1 x Per Day/30 Days Discharge Instructions: Cover with ABD pad Secured With: Medipore T - 59M Medipore H Soft Cloth Surgical T ape ape, 2x2 (in/yd) 1 x Per Day/30 Days Wound #6 - Calcaneus Wound Laterality: Right Cleanser: [place]ram Ancillary Kit - 15  Day Supply (Generic) 3 x Per Week/30 Days Discharge Instructions: Use supplies as instructed; Kit contains: (15) Saline Bullets; (15) 3x3 Gauze; 15 pr Gloves Cleanser: Dakin 16 (oz) 0.25 (DME) (Dispense As Written) 3 x Per Week/30 Days Discharge Instructions: Use as directed. Cleanser: Soap and Water 3 x Per Week/30 Days Discharge Instructions: Gently cleanse wound with antibacterial soap, rinse and pat dry prior to dressing wounds Topical: Activon Honey Gel, 25 (g) Tube (DME) (Dispense As Written) 3 x Per Week/30 Days Prim Dressing: Silvercel 4 1/4x 4 1/4 (in/in) (Generic) 3 x Per Week/30 Days ary Discharge Instructions: Apply Silvercel 4 1/4x 4 1/4 (in/in) as instructed Secondary Dressing: ABD Pad 5x9 (in/in) 3 x Per Week/30 Days Discharge Instructions: Cover with ABD pad Secondary Dressing: Kerlix 4.5 x 4.1 (in/yd) 3 x Per Week/30 Days Discharge Instructions: Apply Kerlix 4.5 x 4.1 (in/yd) as instructed Secured With: Medipore T - 59M Medipore H Soft Cloth Surgical T ape ape, 2x2 (in/yd) 3 x Per Week/30 Days Wound #8 - Gluteus Wound Laterality: Left, Distal Cleanser: Dakin 16 (oz) 0.25 (Generic) 1 x Per Day/30 Days Discharge Instructions: Use as directed. [name], [name] ([id number]) [id number] Page 5 of 10 Cleanser: Soap and Water 1 x Per Day/30 Days Discharge Instructions: Gently cleanse wound with antibacterial soap, rinse and pat dry prior to dressing wounds Prim Dressing: Gauze 1 x Per Day/30 Days ary Discharge Instructions: moistened with Dakins Solution Secondary Dressing: ABD Pad 5x9 (in/in) 1 x Per Day/30 Days Discharge Instructions: Cover with ABD pad Secured With: Medipore T - 59M Medipore H Soft Cloth Surgical T ape ape, 2x2 (in/yd) 1 x Per Day/30 Days Patient Medications llergies: doxycycline, Sulfa (Sulfonamide Antibiotics) A Notifications Medication Indication Start End 10/24/2022 amoxicillin-pot clavulanate DOSE 1 - oral 875 mg-125 mg  tablet - 1 tablet oral twice a  day x 14 days Electronic Signature(s) Signed: 10/24/2022 4:20:53 PM By: Kalman Shan DO Previous Signature: 10/24/2022 4:18:39 PM Version By: Kalman Shan DO Entered By: Kalman Shan on 10/24/2022 16:20:32 -------------------------------------------------------------------------------- Problem List Details Patient Name: Date of Service: [facility], [name]. 10/24/2022 3:00 PM Medical Record Number: [id number] Patient Account Number: [id number] Date of Birth/Sex: Treating RN: [birth year] (81 y.o. [name] Primary Care Provider: Tracie [name] Other Clinician: Massie [name] Referring Provider: Treating Provider/Extender: [name] in Treatment: 4 Active Problems ICD-10 Encounter Code Description Active Date MDM Diagnosis 5174678000 Non-pressure chronic ulcer of other part of left lower leg with fat layer 09/26/2022 No Yes exposed I87.312 Chronic venous hypertension (idiopathic) with ulcer of left lower extremity 09/26/2022 No Yes I89.0 Lymphedema, not elsewhere classified 09/26/2022 No Yes E11.622 Type 2 diabetes mellitus with other skin ulcer 09/26/2022 No Yes B14.78 Chronic diastolic (congestive) heart failure 09/26/2022 No Yes Gumina, Jessel [name] ([id number]) [id number] Page 6 of 10 L89.320 Pressure ulcer of left buttock, unstageable 09/26/2022 No Yes L89.610 Pressure ulcer of right heel, unstageable 09/26/2022 No Yes S91.302A Unspecified open wound, left foot, initial encounter 10/24/2022 No Yes M86.172 Other acute osteomyelitis, left ankle and foot 10/24/2022 No Yes Inactive Problems Resolved Problems Electronic Signature(s) Signed: 10/24/2022 4:20:53 PM By: Kalman Shan DO Entered By: Kalman Shan on 10/24/2022 16:12:28 -------------------------------------------------------------------------------- Progress Note Details Patient Name: Date of Service: [facility], [name]. 10/24/2022 3:00 PM Medical Record Number: [id number] Patient Account Number: [id number] Date of Birth/Sex: Treating RN: [birth year] (81 y.o. [name] Primary Care Provider: Tracie [name] Other Clinician: Massie [name] Referring Provider: Treating Provider/Extender: [name] in Treatment: 4 Subjective Chief Complaint Information obtained from Patient 09/26/2022; left lower extremity wound, right heel wound and left buttocks wound History of Present Illness (HPI) 81 year old patient who comes with a referral for bilateral lower extremity edema and a lower extremity ulceration and has been sent by her PCP Dr. Placido [name]. I understand the patient was recently put on amoxicillin and doxycycline but could not tolerate the amoxicillin. doxycycline course was completed. a BNP and EKG was supposed to be normal and the patient did not have any dyspnea. the patient has been on a diuretic. The patient was also prescribed a pair of elastic compression stockings of the 20-30 mmHg pressure variety. x-ray of the right ankle was done on 09/20/2015 and showed posttraumatic and postsurgical changes of the right ankle with secondary degenerative changes of the tibiotalar joint and to a lesser degree the subtalar joint. No definite acute bony abnormalities are noted. Past medical history significant for diabetes mellitus, hypertension, hyperlipidemia, right breast cancer treated with a mastectomy in 2014. She has never smoked. 10/24/2015 -- she had delayed her vascular test because of her husband surgery but she is now ready to get him taken care of. He is also unable to use compression stockings and hence we will need to order her Juzo wraps. 10/31/2015-- was seen by [name] on 10/28/2015. She had a left lower extremity arterial duplex done at his office a couple of years ago and that was essentially normal. Today they performed a venous duplex which  revealed no evidence of deep vein thrombosis, superficial thrombophlebitis, no venous reflex was seen on the right and a minimal amount of reflux was seen on the left great saphenous vein but no significant reflux was seen. Impression was that there was a component of lymphedema present from a previous surgery and he would  recommend compression stockings and leg elevation. Readmission: 07-24-2022 upon evaluation today patient presents for initial inspection here in our clinic concerning issues that she has been having with her legs this is actually been going on for several years according to what her family member with her today tells me as well as what the patient reiterates as well. She is currently most recently been seeing [name] and subsequently he had her in [place] boots. However 2 [name] ago he referred her to [place] and then subsequently took her out of the [employer] boot wraps at that point. At this time the left leg looks to be worse in the right leg currently. She is on Lasix and lisinopril with [name], [name] ([id number]) [id number] Page 7 of 10 hydrochlorothiazide she has high blood pressure she also has issues currently with lower extremity swelling and edema which has been an ongoing issue for her as well. Patient does have a history of chronic venous hypertension, lymphedema, diabetes mellitus type 2, hypertension, peripheral vascular disease, and neuropathy. Currently she is on Lasix as well as lisinopril with hydrochlorothiazide. 08-02-2022 upon evaluation today patient presents for reevaluation the good news is she is actually doing somewhat better in regard to the wound and the overall appearance and sinuses. The unfortunate thing is her infection really is not significantly improved we did have to switch out her antibiotic once we got that final result back and I switched her to Levaquin and away from the doxycycline. Unfortunately the doxycycline had been  doing poorly for her. In fact she had had diarrhea from the time she started taking it on Friday and she is still having it when she shows up today for evaluation. Again I was not aware of this and obviously she does appear to be somewhat dehydrated as well based on what I see. My concern which I discussed with the patient today is the possibility of a C. difficile infection. With that being said fact this started immediately upon taking the doxycycline makes me think that it was just the medicine and is not completely out of her system yet despite having taken the last dose Tuesday morning. Nonetheless with what we are seeing currently I want to be sure that reason I Minna contact her primary care provider and see if a would be willing to see her and test for C. difficile infection. 08-07-2022 upon evaluation today patient appears to be doing well currently in regard to her wounds which are actually measuring much better this is great news. Fortunately I do not see any signs of active infection locally or systemically at this time which is great as well. The good news is she was tested for a C. difficile infection and it was negative. I am very pleased and thankful for primary care provider for doing that so this means that she was just having a severe reaction to the doxycycline we have added that to her allergy list at this point. 08-14-2022 upon evaluation today patient appears to be doing well currently in regard to her dehydration she is actually significantly improved compared to last time I saw her last week. With that being said I do not see any evidence of active infection systemically at this time which is great news. However locally she still does appear to have cellulitis in left lower extremity. I discussed with her today that I do believe she would benefit from going ahead and starting the [place]. Again we will concerned about the C. difficile infection  of the diarrhea but that this  turned out to be just an issue with a reaction to the doxycycline. My hope is that the Levaquin will not cause her any complication and will be able to treat the infection. I did review her arterial study as well which was dated on 07-31-2022. It showed that she had a ABI on the right of 1.30 and on the left of 1.27 with a TBI on the right of 1.12 and the left 1.21 this is a normal arterial study. Again on the patient's wound culture she actually did show evidence of Proteus, Morganella, and MRSA. Levaquin is a good option here across the board. 09/26/2022 Ms. Diane Lippens is a 81 year old female with a past medical history of type 2 diabetes currently controlled on oral agents, venous insufficiency/lymphedema, right breast cancer and chronic diastolic heart failure that presents to the clinic for a 1 month history of nonhealing wounds to the left lower extremity, right heel and left buttocks. She states that the buttocks wound and the right heel wound developed while she was in the hospital. She was admitted on 08/22/2022 for severe sepsis secondary to acute sigmoid and distal colonic diverticulitis. At that time it was noted she had a stage I decubitus ulcer to her bilateral buttocks. A wound to the heel was not mentioned. She currently denies systemic signs of infection. She came into clinic in a blue gown with no undergarments. She has not been dressing the wounds. She states she just recently obtained home health and they are coming out for the first time this week. She is seen in our clinic often for lower extremity wounds secondary to venous insufficiency. 11/22; patient presents for follow-up. Son is present during the encounter. Per son it sounds like they are not doing any dressing changes. She states she has home health but they have not come out. She is going to let us know which home health agency she has been approved for so we can send orders. Currently she denies signs of  infection. 12/13; patient presents for follow-up. Patient has home health and they are coming out once a week. It appears that there has not been any dressing changes except for with home health. Patient has a newly discovered wound to the left foot. There is exposed bone. There is slight erythema and increased warmth to the surrounding tissue. Patient is completely unaware of this wound. Objective Constitutional Vitals Time Taken: 3:20 PM, Height: 59 in, Weight: 116 lbs, BMI: 23.4, Temperature: 98.3 F, Pulse: 93 bpm, Respiratory Rate: 16 breaths/min, Blood Pressure: 128/82 mmHg. General Notes: T the right heel there is an open wound with nonviable tissue and granulation tissue. T the left buttocks there is a large open wound with o o nonviable tissue and granulation tissue. T the left foot first metatarsal there is an open wound with exposed bone. Slight erythema and increased warmth to o the surrounding periwound Integumentary (Hair, Skin) Wound #10 status is Open. Original cause of wound was Pressure Injury. The date acquired was: 10/23/2022. The wound is located on the Left Metatarsal head first. The wound measures 2cm length x 2.5cm width x 0.2cm depth; 3.927cm^2 area and 0.785cm^3 volume. There is bone exposed. There is no tunneling or undermining noted. There is a large amount of serosanguineous drainage noted. The wound margin is flat and intact. There is no granulation within the wound bed. There is a medium (34-66%) amount of necrotic tissue within the wound bed including Necrosis of Bone.  Wound #6 status is Open. Original cause of wound was Pressure Injury. The date acquired was: 07/23/2022. The wound has been in treatment 4 [name]. The wound is located on the Right Calcaneus. The wound measures 3.4cm length x 2.5cm width x 0.2cm depth; 6.676cm^2 area and 1.335cm^3 volume. There is no tunneling or undermining noted. There is a medium amount of serosanguineous drainage noted. Foul odor  after cleansing was noted. There is no granulation within the wound bed. There is a large (67-100%) amount of necrotic tissue within the wound bed including Eschar. Wound #8 status is Open. Original cause of wound was Pressure Injury. The date acquired was: 07/23/2022. The wound has been in treatment 4 [name]. The wound is located on the Left,Distal Gluteus. The wound measures 2.5cm length x 3.5cm width x 1.1cm depth; 6.872cm^2 area and 7.559cm^3 volume. There is no tunneling noted, however, there is undermining starting at 10:00 and ending at 5:00 with a maximum distance of 1.2cm. There is a medium amount of serosanguineous drainage noted. Foul odor after cleansing was noted. There is no granulation within the wound bed. There is a large (67-100%) amount of necrotic tissue within the wound bed including Adherent Slough. Wound #9 status is Open. Original cause of wound was Pressure Injury. The date acquired was: 10/23/2022. The wound is located on the Left,Medial Foot. The wound measures 2cm length x 2cm width x 0.2cm depth; 3.142cm^2 area and 0.628cm^3 volume. There is bone and Fat Layer (Subcutaneous Tissue) exposed. There is no tunneling or undermining noted. There is a large amount of serosanguineous drainage noted. The wound margin is flat and intact. There is no granulation within the wound bed. There is a small (1-33%) amount of necrotic tissue within the wound bed including Necrosis of Bone. [name], [name] ([id number]) [id number] Page 8 of 10 Assessment Active Problems ICD-10 Non-pressure chronic ulcer of other part of left lower leg with fat layer exposed Chronic venous hypertension (idiopathic) with ulcer of left lower extremity Lymphedema, not elsewhere classified Type 2 diabetes mellitus with other skin ulcer Chronic diastolic (congestive) heart failure Pressure ulcer of left buttock, unstageable Pressure ulcer of right heel, unstageable Unspecified  open wound, left foot, initial encounter Other acute osteomyelitis, left ankle and foot Patient's right heel and left gluteus wounds have slight improvement in appearance but overall stable. I debrided nonviable tissue to the right heel wound. I recommended Dakin's wet-to-dry dressings to the sacral wound and Medihoney with silver alginate to the right heel wound. She has not obtained the Prevalon boot. We will assure that she gets two since she has a new wound to the left first met head. This probes to bone. I recommended Dakin's wet-to-dry dressings here. I recommended oral antibiotics. She is at high risk for amputation. All her wounds are pressure related. Patient has a failure to thrive picture. She is likely going to be unable to heal these wounds as she cannot offload very well. She has pressure injury to the right hip. There is no open wound here. I recommended she start offloading this area and rotate to the left side. Procedures Wound #6 Pre-procedure diagnosis of Wound #6 is a Pressure Ulcer located on the Right Calcaneus . There was a Selective/Open Wound Non-Viable Tissue Debridement with a total area of 1 sq cm performed by Kalman Shan, MD. With the following instrument(s): Blade, and Forceps to remove Viable and Non-Viable tissue/material.. A time out was conducted at 15:48, prior to the start of the procedure. A Minimum amount  of bleeding was controlled with Pressure. The procedure was tolerated well. Post Debridement Measurements: 3.4cm length x 2.5cm width x 0.2cm depth; 1.335cm^3 volume. Post debridement Stage noted as Unstageable/Unclassified. Character of Wound/Ulcer Post Debridement is stable. Post procedure Diagnosis Wound #6: Same as Pre-Procedure Plan Follow-up Appointments: Return Appointment in 1 week. Home Health: [facility] for wound care. May utilize formulary equivalent dressing for wound treatment orders unless otherwise specified. Home Health  Nurse may visit PRN to address patientoos wound care needs. Alvis [name] [phone] Bathing/ Shower/ Hygiene: Wash wounds with antibacterial soap and water. The following medication(s) was prescribed: amoxicillin-pot clavulanate oral 875 mg-125 mg tablet 1 1 tablet oral twice a day x 14 days starting 10/24/2022 WOUND #10: - Metatarsal head first Wound Laterality: Left Cleanser: Dakin 16 (oz) 0.25 (Generic) 1 x Per Day/30 Days Discharge Instructions: Use as directed. Cleanser: Soap and Water 1 x Per Day/30 Days Discharge Instructions: Gently cleanse wound with antibacterial soap, rinse and pat dry prior to dressing wounds Prim Dressing: Gauze 1 x Per Day/30 Days ary Discharge Instructions: moistened with Dakins Solution Secondary Dressing: ABD Pad 5x9 (in/in) 1 x Per Day/30 Days Discharge Instructions: Cover with ABD pad Secured With: Medipore T - 50M Medipore H Soft Cloth Surgical T ape ape, 2x2 (in/yd) 1 x Per Day/30 Days WOUND #6: - Calcaneus Wound Laterality: Right Cleanser: Byram Ancillary Kit - 15 Day Supply (Generic) 3 x Per Week/30 Days Discharge Instructions: Use supplies as instructed; Kit contains: (15) Saline Bullets; (15) 3x3 Gauze; 15 pr Gloves Cleanser: Dakin 16 (oz) 0.25 (DME) (Dispense As Written) 3 x Per Week/30 Days Discharge Instructions: Use as directed. Cleanser: Soap and Water 3 x Per Week/30 Days Discharge Instructions: Gently cleanse wound with antibacterial soap, rinse and pat dry prior to dressing wounds Topical: Activon Honey Gel, 25 (g) Tube (DME) (Dispense As Written) 3 x Per Week/30 Days Prim Dressing: Silvercel 4 1/4x 4 1/4 (in/in) (Generic) 3 x Per Week/30 Days ary Discharge Instructions: Apply Silvercel 4 1/4x 4 1/4 (in/in) as instructed Secondary Dressing: ABD Pad 5x9 (in/in) 3 x Per Week/30 Days Discharge Instructions: Cover with ABD pad Secondary Dressing: Kerlix 4.5 x 4.1 (in/yd) 3 x Per Week/30 Days Discharge Instructions: Apply Kerlix 4.5 x 4.1  (in/yd) as instructed Secured With: Medipore T - 50M Medipore H Soft Cloth Surgical T ape ape, 2x2 (in/yd) 3 x Per Week/30 Days WOUND #8: - Gluteus Wound Laterality: Left, Distal Cleanser: Dakin 16 (oz) 0.25 (Generic) 1 x Per Day/30 Days Discharge Instructions: Use as directed. Cleanser: Soap and Water 1 x Per Day/30 Days Discharge Instructions: Gently cleanse wound with antibacterial soap, rinse and pat dry prior to dressing wounds Prim Dressing: Gauze 1 x Per Day/30 Days ary Discharge Instructions: moistened with Dakins Solution Secondary Dressing: ABD Pad 5x9 (in/in) 1 x Per Day/30 Days Discharge Instructions: Cover with ABD pad [name], [name] ([id number]) [id number] Page 9 of 10 Secured With: Medipore T - 50M Medipore H Soft Cloth Surgical T ape ape, 2x2 (in/yd) 1 x Per Day/30 Days 1. In office sharp debridementooright heel 2. Dakin's wet-to-dry dressings to the left foot and buttocks wound 3. Aggressive offloadingooPrevalon boot, reposition 4. Augmentin 5. Follow-up in 1 week Electronic Signature(s) Signed: 10/24/2022 4:20:53 PM By: Kalman Shan DO Entered By: Kalman Shan on 10/24/2022 16:19:40 -------------------------------------------------------------------------------- SuperBill Details Patient Name: Date of Service: [facility], [name]. 10/24/2022 Medical Record Number: [id number] Patient Account Number: [id number] Date of Birth/Sex: Treating RN: 04/21/1941 (81 y.o. Inocencio Homes,  Jocelyn Lamer Primary Care Provider: Tracie [name] Other Clinician: Massie [name] Referring Provider: Treating Provider/Extender: [name] in Treatment: 4 Diagnosis Coding ICD-10 Codes Code Description [phone] Non-pressure chronic ulcer of other part of left lower leg with fat layer exposed I87.312 Chronic venous hypertension (idiopathic) with ulcer of left lower extremity I89.0 Lymphedema, not elsewhere  classified E11.622 Type 2 diabetes mellitus with other skin ulcer D78.24 Chronic diastolic (congestive) heart failure L89.320 Pressure ulcer of left buttock, unstageable L89.610 Pressure ulcer of right heel, unstageable S91.302A Unspecified open wound, left foot, initial encounter M86.172 Other acute osteomyelitis, left ankle and foot Facility Procedures : CPT4 Code: [id number] Description: 31540 - DEBRIDE WOUND 1ST 20 SQ CM OR < ICD-10 Diagnosis Description L89.610 Pressure ulcer of right heel, unstageable Modifier: Quantity: 1 Physician Procedures : CPT4 Code Description Modifier [id number] - WC PHYS LEVEL 4 - EST PT ICD-10 Diagnosis Description S91.302A Unspecified open wound, left foot, initial encounter M86.172 Other acute osteomyelitis, left ankle and foot L89.610 Pressure ulcer of right  heel, unstageable L89.320 Pressure ulcer of left buttock, unstageable Quantity: 1 : [id number] - WC PHYS DEBR WO ANESTH 20 SQ CM ICD-10 Diagnosis Description L89.610 Pressure ulcer of right heel, unstageable [name], [name] ([id number]) [id number] Quantity: 1 df Page 10 of 10 Electronic Signature(s) Signed: 10/24/2022 4:20:53 PM By: Kalman Shan DO Entered By: Kalman Shan on 10/24/2022 16:20:22

## 2022-10-31 ENCOUNTER — Encounter (HOSPITAL_BASED_OUTPATIENT_CLINIC_OR_DEPARTMENT_OTHER): Payer: PPO | Admitting: Internal Medicine

## 2022-10-31 DIAGNOSIS — L97522 Non-pressure chronic ulcer of other part of left foot with fat layer exposed: Secondary | ICD-10-CM | POA: Diagnosis present

## 2022-10-31 DIAGNOSIS — L97311 Non-pressure chronic ulcer of right ankle limited to breakdown of skin: Secondary | ICD-10-CM | POA: Diagnosis not present

## 2022-10-31 DIAGNOSIS — I5032 Chronic diastolic (congestive) heart failure: Secondary | ICD-10-CM | POA: Diagnosis present

## 2022-10-31 DIAGNOSIS — R339 Retention of urine, unspecified: Secondary | ICD-10-CM | POA: Diagnosis not present

## 2022-10-31 DIAGNOSIS — N390 Urinary tract infection, site not specified: Secondary | ICD-10-CM | POA: Diagnosis not present

## 2022-10-31 DIAGNOSIS — G9341 Metabolic encephalopathy: Secondary | ICD-10-CM | POA: Diagnosis present

## 2022-10-31 DIAGNOSIS — E872 Acidosis, unspecified: Secondary | ICD-10-CM | POA: Diagnosis present

## 2022-10-31 DIAGNOSIS — E11622 Type 2 diabetes mellitus with other skin ulcer: Secondary | ICD-10-CM

## 2022-10-31 DIAGNOSIS — L97414 Non-pressure chronic ulcer of right heel and midfoot with necrosis of bone: Secondary | ICD-10-CM | POA: Diagnosis not present

## 2022-10-31 DIAGNOSIS — I110 Hypertensive heart disease with heart failure: Secondary | ICD-10-CM | POA: Diagnosis present

## 2022-10-31 DIAGNOSIS — I82412 Acute embolism and thrombosis of left femoral vein: Secondary | ICD-10-CM | POA: Diagnosis present

## 2022-10-31 DIAGNOSIS — N134 Hydroureter: Secondary | ICD-10-CM | POA: Diagnosis not present

## 2022-10-31 DIAGNOSIS — L03116 Cellulitis of left lower limb: Secondary | ICD-10-CM | POA: Diagnosis not present

## 2022-10-31 DIAGNOSIS — E782 Mixed hyperlipidemia: Secondary | ICD-10-CM | POA: Diagnosis not present

## 2022-10-31 DIAGNOSIS — E119 Type 2 diabetes mellitus without complications: Secondary | ICD-10-CM | POA: Diagnosis not present

## 2022-10-31 DIAGNOSIS — M7731 Calcaneal spur, right foot: Secondary | ICD-10-CM | POA: Diagnosis not present

## 2022-10-31 DIAGNOSIS — E1151 Type 2 diabetes mellitus with diabetic peripheral angiopathy without gangrene: Secondary | ICD-10-CM | POA: Diagnosis present

## 2022-10-31 DIAGNOSIS — R4182 Altered mental status, unspecified: Secondary | ICD-10-CM | POA: Diagnosis not present

## 2022-10-31 DIAGNOSIS — L97829 Non-pressure chronic ulcer of other part of left lower leg with unspecified severity: Secondary | ICD-10-CM | POA: Diagnosis not present

## 2022-10-31 DIAGNOSIS — E7800 Pure hypercholesterolemia, unspecified: Secondary | ICD-10-CM | POA: Diagnosis present

## 2022-10-31 DIAGNOSIS — L89610 Pressure ulcer of right heel, unstageable: Secondary | ICD-10-CM

## 2022-10-31 DIAGNOSIS — E039 Hypothyroidism, unspecified: Secondary | ICD-10-CM | POA: Diagnosis present

## 2022-10-31 DIAGNOSIS — M86172 Other acute osteomyelitis, left ankle and foot: Secondary | ICD-10-CM | POA: Diagnosis not present

## 2022-10-31 DIAGNOSIS — L97419 Non-pressure chronic ulcer of right heel and midfoot with unspecified severity: Secondary | ICD-10-CM | POA: Diagnosis not present

## 2022-10-31 DIAGNOSIS — I4892 Unspecified atrial flutter: Secondary | ICD-10-CM | POA: Diagnosis present

## 2022-10-31 DIAGNOSIS — I82422 Acute embolism and thrombosis of left iliac vein: Secondary | ICD-10-CM | POA: Diagnosis present

## 2022-10-31 DIAGNOSIS — D75838 Other thrombocytosis: Secondary | ICD-10-CM | POA: Diagnosis present

## 2022-10-31 DIAGNOSIS — E11621 Type 2 diabetes mellitus with foot ulcer: Secondary | ICD-10-CM | POA: Diagnosis present

## 2022-10-31 DIAGNOSIS — Z743 Need for continuous supervision: Secondary | ICD-10-CM | POA: Diagnosis not present

## 2022-10-31 DIAGNOSIS — Z1152 Encounter for screening for COVID-19: Secondary | ICD-10-CM | POA: Diagnosis not present

## 2022-10-31 DIAGNOSIS — L89320 Pressure ulcer of left buttock, unstageable: Secondary | ICD-10-CM

## 2022-10-31 DIAGNOSIS — A4189 Other specified sepsis: Secondary | ICD-10-CM | POA: Diagnosis not present

## 2022-10-31 DIAGNOSIS — L97519 Non-pressure chronic ulcer of other part of right foot with unspecified severity: Secondary | ICD-10-CM | POA: Diagnosis present

## 2022-10-31 DIAGNOSIS — R7881 Bacteremia: Secondary | ICD-10-CM | POA: Diagnosis not present

## 2022-10-31 DIAGNOSIS — M79674 Pain in right toe(s): Secondary | ICD-10-CM | POA: Diagnosis not present

## 2022-10-31 DIAGNOSIS — L89154 Pressure ulcer of sacral region, stage 4: Secondary | ICD-10-CM | POA: Diagnosis present

## 2022-10-31 DIAGNOSIS — R6520 Severe sepsis without septic shock: Secondary | ICD-10-CM | POA: Diagnosis present

## 2022-10-31 DIAGNOSIS — I871 Compression of vein: Secondary | ICD-10-CM | POA: Diagnosis not present

## 2022-10-31 DIAGNOSIS — R001 Bradycardia, unspecified: Secondary | ICD-10-CM | POA: Diagnosis not present

## 2022-10-31 DIAGNOSIS — M898X7 Other specified disorders of bone, ankle and foot: Secondary | ICD-10-CM | POA: Diagnosis not present

## 2022-10-31 DIAGNOSIS — B351 Tinea unguium: Secondary | ICD-10-CM | POA: Diagnosis not present

## 2022-10-31 DIAGNOSIS — K5730 Diverticulosis of large intestine without perforation or abscess without bleeding: Secondary | ICD-10-CM | POA: Diagnosis not present

## 2022-10-31 DIAGNOSIS — I10 Essential (primary) hypertension: Secondary | ICD-10-CM | POA: Diagnosis not present

## 2022-10-31 DIAGNOSIS — M79675 Pain in left toe(s): Secondary | ICD-10-CM | POA: Diagnosis not present

## 2022-10-31 DIAGNOSIS — E440 Moderate protein-calorie malnutrition: Secondary | ICD-10-CM | POA: Diagnosis present

## 2022-10-31 DIAGNOSIS — N1330 Unspecified hydronephrosis: Secondary | ICD-10-CM | POA: Diagnosis present

## 2022-10-31 DIAGNOSIS — L89323 Pressure ulcer of left buttock, stage 3: Secondary | ICD-10-CM | POA: Diagnosis not present

## 2022-10-31 DIAGNOSIS — L97322 Non-pressure chronic ulcer of left ankle with fat layer exposed: Secondary | ICD-10-CM | POA: Diagnosis present

## 2022-10-31 DIAGNOSIS — I959 Hypotension, unspecified: Secondary | ICD-10-CM | POA: Diagnosis not present

## 2022-10-31 DIAGNOSIS — I87313 Chronic venous hypertension (idiopathic) with ulcer of bilateral lower extremity: Secondary | ICD-10-CM | POA: Diagnosis not present

## 2022-10-31 DIAGNOSIS — L89616 Pressure-induced deep tissue damage of right heel: Secondary | ICD-10-CM | POA: Diagnosis not present

## 2022-10-31 DIAGNOSIS — S91205A Unspecified open wound of left lesser toe(s) with damage to nail, initial encounter: Secondary | ICD-10-CM | POA: Diagnosis not present

## 2022-10-31 DIAGNOSIS — M19071 Primary osteoarthritis, right ankle and foot: Secondary | ICD-10-CM | POA: Diagnosis not present

## 2022-10-31 DIAGNOSIS — D649 Anemia, unspecified: Secondary | ICD-10-CM | POA: Diagnosis present

## 2022-10-31 DIAGNOSIS — L03115 Cellulitis of right lower limb: Secondary | ICD-10-CM | POA: Diagnosis not present

## 2022-10-31 DIAGNOSIS — I8290 Acute embolism and thrombosis of unspecified vein: Secondary | ICD-10-CM | POA: Diagnosis present

## 2022-10-31 DIAGNOSIS — A419 Sepsis, unspecified organism: Secondary | ICD-10-CM | POA: Diagnosis present

## 2022-10-31 DIAGNOSIS — R509 Fever, unspecified: Secondary | ICD-10-CM | POA: Diagnosis not present

## 2022-10-31 DIAGNOSIS — R000 Tachycardia, unspecified: Secondary | ICD-10-CM | POA: Diagnosis not present

## 2022-10-31 DIAGNOSIS — E876 Hypokalemia: Secondary | ICD-10-CM | POA: Diagnosis present

## 2022-10-31 DIAGNOSIS — L89613 Pressure ulcer of right heel, stage 3: Secondary | ICD-10-CM | POA: Diagnosis present

## 2022-11-01 ENCOUNTER — Emergency Department: Payer: PPO

## 2022-11-01 ENCOUNTER — Inpatient Hospital Stay
Admission: EM | Admit: 2022-11-01 | Discharge: 2022-11-09 | DRG: 853 | Disposition: A | Discharge status: Nursing Home (Includes ICF) | Payer: PPO | Source: Skilled Nursing Facility | Attending: Internal Medicine | Admitting: Internal Medicine

## 2022-11-01 DIAGNOSIS — Z1152 Encounter for screening for COVID-19: Secondary | ICD-10-CM

## 2022-11-01 DIAGNOSIS — I8290 Acute embolism and thrombosis of unspecified vein: Secondary | ICD-10-CM

## 2022-11-01 DIAGNOSIS — D649 Anemia, unspecified: Secondary | ICD-10-CM | POA: Diagnosis present

## 2022-11-01 DIAGNOSIS — E1151 Type 2 diabetes mellitus with diabetic peripheral angiopathy without gangrene: Secondary | ICD-10-CM | POA: Diagnosis present

## 2022-11-01 DIAGNOSIS — L97519 Non-pressure chronic ulcer of other part of right foot with unspecified severity: Secondary | ICD-10-CM | POA: Diagnosis present

## 2022-11-01 DIAGNOSIS — L03116 Cellulitis of left lower limb: Secondary | ICD-10-CM | POA: Diagnosis not present

## 2022-11-01 DIAGNOSIS — L97522 Non-pressure chronic ulcer of other part of left foot with fat layer exposed: Secondary | ICD-10-CM | POA: Diagnosis present

## 2022-11-01 DIAGNOSIS — I82422 Acute embolism and thrombosis of left iliac vein: Secondary | ICD-10-CM | POA: Diagnosis present

## 2022-11-01 DIAGNOSIS — I871 Compression of vein: Secondary | ICD-10-CM | POA: Diagnosis not present

## 2022-11-01 DIAGNOSIS — I110 Hypertensive heart disease with heart failure: Secondary | ICD-10-CM | POA: Diagnosis present

## 2022-11-01 DIAGNOSIS — I10 Essential (primary) hypertension: Secondary | ICD-10-CM | POA: Diagnosis present

## 2022-11-01 DIAGNOSIS — E7800 Pure hypercholesterolemia, unspecified: Secondary | ICD-10-CM | POA: Diagnosis present

## 2022-11-01 DIAGNOSIS — L89323 Pressure ulcer of left buttock, stage 3: Secondary | ICD-10-CM | POA: Diagnosis not present

## 2022-11-01 DIAGNOSIS — I82412 Acute embolism and thrombosis of left femoral vein: Secondary | ICD-10-CM | POA: Diagnosis present

## 2022-11-01 DIAGNOSIS — M109 Gout, unspecified: Secondary | ICD-10-CM | POA: Diagnosis present

## 2022-11-01 DIAGNOSIS — Z993 Dependence on wheelchair: Secondary | ICD-10-CM

## 2022-11-01 DIAGNOSIS — R339 Retention of urine, unspecified: Secondary | ICD-10-CM | POA: Diagnosis not present

## 2022-11-01 DIAGNOSIS — E872 Acidosis, unspecified: Secondary | ICD-10-CM | POA: Diagnosis present

## 2022-11-01 DIAGNOSIS — E119 Type 2 diabetes mellitus without complications: Secondary | ICD-10-CM

## 2022-11-01 DIAGNOSIS — I4892 Unspecified atrial flutter: Secondary | ICD-10-CM | POA: Diagnosis present

## 2022-11-01 DIAGNOSIS — A419 Sepsis, unspecified organism: Principal | ICD-10-CM | POA: Diagnosis present

## 2022-11-01 DIAGNOSIS — G9341 Metabolic encephalopathy: Secondary | ICD-10-CM | POA: Diagnosis present

## 2022-11-01 DIAGNOSIS — D75838 Other thrombocytosis: Secondary | ICD-10-CM | POA: Insufficient documentation

## 2022-11-01 DIAGNOSIS — L97311 Non-pressure chronic ulcer of right ankle limited to breakdown of skin: Secondary | ICD-10-CM | POA: Diagnosis present

## 2022-11-01 DIAGNOSIS — R6520 Severe sepsis without septic shock: Secondary | ICD-10-CM | POA: Diagnosis present

## 2022-11-01 DIAGNOSIS — E440 Moderate protein-calorie malnutrition: Secondary | ICD-10-CM | POA: Diagnosis present

## 2022-11-01 DIAGNOSIS — Z79890 Hormone replacement therapy (postmenopausal): Secondary | ICD-10-CM

## 2022-11-01 DIAGNOSIS — M79675 Pain in left toe(s): Secondary | ICD-10-CM | POA: Diagnosis not present

## 2022-11-01 DIAGNOSIS — L97322 Non-pressure chronic ulcer of left ankle with fat layer exposed: Secondary | ICD-10-CM | POA: Diagnosis present

## 2022-11-01 DIAGNOSIS — E039 Hypothyroidism, unspecified: Secondary | ICD-10-CM | POA: Diagnosis present

## 2022-11-01 DIAGNOSIS — Z833 Family history of diabetes mellitus: Secondary | ICD-10-CM

## 2022-11-01 DIAGNOSIS — Z79899 Other long term (current) drug therapy: Secondary | ICD-10-CM

## 2022-11-01 DIAGNOSIS — E876 Hypokalemia: Secondary | ICD-10-CM | POA: Diagnosis present

## 2022-11-01 DIAGNOSIS — Z6823 Body mass index (BMI) 23.0-23.9, adult: Secondary | ICD-10-CM

## 2022-11-01 DIAGNOSIS — N1330 Unspecified hydronephrosis: Secondary | ICD-10-CM | POA: Diagnosis present

## 2022-11-01 DIAGNOSIS — M79674 Pain in right toe(s): Secondary | ICD-10-CM | POA: Diagnosis not present

## 2022-11-01 DIAGNOSIS — L97829 Non-pressure chronic ulcer of other part of left lower leg with unspecified severity: Secondary | ICD-10-CM | POA: Diagnosis not present

## 2022-11-01 DIAGNOSIS — L89616 Pressure-induced deep tissue damage of right heel: Secondary | ICD-10-CM | POA: Diagnosis not present

## 2022-11-01 DIAGNOSIS — D631 Anemia in chronic kidney disease: Secondary | ICD-10-CM | POA: Insufficient documentation

## 2022-11-01 DIAGNOSIS — E11621 Type 2 diabetes mellitus with foot ulcer: Secondary | ICD-10-CM | POA: Diagnosis present

## 2022-11-01 DIAGNOSIS — N390 Urinary tract infection, site not specified: Secondary | ICD-10-CM | POA: Diagnosis not present

## 2022-11-01 DIAGNOSIS — L89154 Pressure ulcer of sacral region, stage 4: Secondary | ICD-10-CM | POA: Diagnosis present

## 2022-11-01 DIAGNOSIS — I87313 Chronic venous hypertension (idiopathic) with ulcer of bilateral lower extremity: Secondary | ICD-10-CM | POA: Diagnosis not present

## 2022-11-01 DIAGNOSIS — Z853 Personal history of malignant neoplasm of breast: Secondary | ICD-10-CM

## 2022-11-01 DIAGNOSIS — Z882 Allergy status to sulfonamides status: Secondary | ICD-10-CM

## 2022-11-01 DIAGNOSIS — Z923 Personal history of irradiation: Secondary | ICD-10-CM

## 2022-11-01 DIAGNOSIS — S91205A Unspecified open wound of left lesser toe(s) with damage to nail, initial encounter: Secondary | ICD-10-CM | POA: Diagnosis not present

## 2022-11-01 DIAGNOSIS — L89613 Pressure ulcer of right heel, stage 3: Secondary | ICD-10-CM | POA: Diagnosis present

## 2022-11-01 DIAGNOSIS — L97419 Non-pressure chronic ulcer of right heel and midfoot with unspecified severity: Secondary | ICD-10-CM | POA: Diagnosis not present

## 2022-11-01 DIAGNOSIS — Z7984 Long term (current) use of oral hypoglycemic drugs: Secondary | ICD-10-CM

## 2022-11-01 DIAGNOSIS — I5032 Chronic diastolic (congestive) heart failure: Secondary | ICD-10-CM | POA: Diagnosis present

## 2022-11-01 DIAGNOSIS — B351 Tinea unguium: Secondary | ICD-10-CM | POA: Diagnosis not present

## 2022-11-01 DIAGNOSIS — Z803 Family history of malignant neoplasm of breast: Secondary | ICD-10-CM

## 2022-11-01 DIAGNOSIS — Z9011 Acquired absence of right breast and nipple: Secondary | ICD-10-CM

## 2022-11-01 DIAGNOSIS — L03115 Cellulitis of right lower limb: Secondary | ICD-10-CM | POA: Diagnosis not present

## 2022-11-01 DIAGNOSIS — R4182 Altered mental status, unspecified: Principal | ICD-10-CM

## 2022-11-01 DIAGNOSIS — R509 Fever, unspecified: Secondary | ICD-10-CM

## 2022-11-01 LAB — RESP PANEL BY RT-PCR (RSV, FLU A&B, COVID)  RVPGX2
Influenza A by PCR: NEGATIVE
Influenza B by PCR: NEGATIVE
Resp Syncytial Virus by PCR: NEGATIVE
SARS Coronavirus 2 by RT PCR: NEGATIVE

## 2022-11-01 LAB — CBC WITH DIFFERENTIAL/PLATELET
Abs Immature Granulocytes: 0.14 10*3/uL — ABNORMAL HIGH (ref 0.00–0.07)
Basophils Absolute: 0.1 10*3/uL (ref 0.0–0.1)
Basophils Relative: 0 %
Eosinophils Absolute: 0 10*3/uL (ref 0.0–0.5)
Eosinophils Relative: 0 %
HCT: 30.1 % — ABNORMAL LOW (ref 36.0–46.0)
Hemoglobin: 9 g/dL — ABNORMAL LOW (ref 12.0–15.0)
Immature Granulocytes: 1 %
Lymphocytes Relative: 12 %
Lymphs Abs: 2.1 10*3/uL (ref 0.7–4.0)
MCH: 24.7 pg — ABNORMAL LOW (ref 26.0–34.0)
MCHC: 29.9 g/dL — ABNORMAL LOW (ref 30.0–36.0)
MCV: 82.5 fL (ref 80.0–100.0)
Monocytes Absolute: 0.4 10*3/uL (ref 0.1–1.0)
Monocytes Relative: 3 %
Neutro Abs: 14.5 10*3/uL — ABNORMAL HIGH (ref 1.7–7.7)
Neutrophils Relative %: 84 %
Platelets: 624 10*3/uL — ABNORMAL HIGH (ref 150–400)
RBC: 3.65 MIL/uL — ABNORMAL LOW (ref 3.87–5.11)
RDW: 18.9 % — ABNORMAL HIGH (ref 11.5–15.5)
WBC: 17.3 10*3/uL — ABNORMAL HIGH (ref 4.0–10.5)
nRBC: 0.1 % (ref 0.0–0.2)

## 2022-11-01 LAB — URINALYSIS, COMPLETE (UACMP) WITH MICROSCOPIC
Bacteria, UA: NONE SEEN
Bilirubin Urine: NEGATIVE
Glucose, UA: NEGATIVE mg/dL
Hgb urine dipstick: NEGATIVE
Ketones, ur: 5 mg/dL — AB
Leukocytes,Ua: NEGATIVE
Nitrite: NEGATIVE
Protein, ur: NEGATIVE mg/dL
Specific Gravity, Urine: 1.006 (ref 1.005–1.030)
pH: 7 (ref 5.0–8.0)

## 2022-11-01 LAB — COMPREHENSIVE METABOLIC PANEL
ALT: 7 U/L (ref 0–44)
AST: 32 U/L (ref 15–41)
Albumin: 3 g/dL — ABNORMAL LOW (ref 3.5–5.0)
Alkaline Phosphatase: 67 U/L (ref 38–126)
Anion gap: 16 — ABNORMAL HIGH (ref 5–15)
BUN: 12 mg/dL (ref 8–23)
CO2: 20 mmol/L — ABNORMAL LOW (ref 22–32)
Calcium: 9.1 mg/dL (ref 8.9–10.3)
Chloride: 104 mmol/L (ref 98–111)
Creatinine, Ser: 0.85 mg/dL (ref 0.44–1.00)
GFR, Estimated: >60 mL/min (ref >60)
Glucose, Bld: 180 mg/dL — ABNORMAL HIGH (ref 70–99)
Potassium: 3.1 mmol/L — ABNORMAL LOW (ref 3.5–5.1)
Sodium: 140 mmol/L (ref 135–145)
Total Bilirubin: 0.8 mg/dL (ref 0.3–1.2)
Total Protein: 7.2 g/dL (ref 6.5–8.1)

## 2022-11-01 LAB — LACTIC ACID, PLASMA
Lactic Acid, Venous: 4.9 mmol/L (ref 0.5–1.9)
Lactic Acid, Venous: 5 mmol/L (ref 0.5–1.9)

## 2022-11-01 LAB — APTT: aPTT: 32 seconds (ref 24–36)

## 2022-11-01 LAB — PROTIME-INR
INR: 1.2 (ref 0.8–1.2)
Prothrombin Time: 14.7 seconds (ref 11.4–15.2)

## 2022-11-01 MED ORDER — LACTATED RINGERS IV BOLUS
1000.0000 mL | Freq: Once | INTRAVENOUS | Status: AC
  Administered 2022-11-01: 1000 mL via INTRAVENOUS

## 2022-11-01 MED ORDER — IOHEXOL 300 MG/ML  SOLN
75.0000 mL | Freq: Once | INTRAMUSCULAR | Status: AC | PRN
  Administered 2022-11-01: 75 mL via INTRAVENOUS

## 2022-11-01 MED ORDER — HEPARIN (PORCINE) 25000 UT/250ML-% IV SOLN
850.0000 [IU]/h | INTRAVENOUS | Status: DC
  Administered 2022-11-01: 600 [IU]/h via INTRAVENOUS
  Administered 2022-11-03: 800 [IU]/h via INTRAVENOUS
  Administered 2022-11-04: 850 [IU]/h via INTRAVENOUS
  Filled 2022-11-01 (×3): qty 250

## 2022-11-01 MED ORDER — SODIUM CHLORIDE 0.9 % IV SOLN
2.0000 g | Freq: Once | INTRAVENOUS | Status: AC
  Administered 2022-11-01: 2 g via INTRAVENOUS
  Filled 2022-11-01: qty 12.5

## 2022-11-01 MED ORDER — VANCOMYCIN HCL IN DEXTROSE 1-5 GM/200ML-% IV SOLN
1000.0000 mg | Freq: Once | INTRAVENOUS | Status: AC
  Administered 2022-11-01: 1000 mg via INTRAVENOUS
  Filled 2022-11-01: qty 200

## 2022-11-01 MED ORDER — HEPARIN BOLUS VIA INFUSION
2200.0000 [IU] | Freq: Once | INTRAVENOUS | Status: AC
  Administered 2022-11-01: 2200 [IU] via INTRAVENOUS
  Filled 2022-11-01: qty 2200

## 2022-11-01 MED ORDER — LORAZEPAM 2 MG/ML IJ SOLN
1.0000 mg | Freq: Once | INTRAMUSCULAR | Status: AC
  Administered 2022-11-01: 1 mg via INTRAVENOUS
  Filled 2022-11-01: qty 1

## 2022-11-01 NOTE — ED Triage Notes (Addendum)
Pt BIB [facility] from [facility] for [place], pt febrile 102.3, no meds given. AMS, unknown last known well, husband reports fever all day, but is poor historian. Unknown history,pt appears bedridden, but unknown. EMS reports sats 88 RA, placed on 3L [place], sats increased to 100%, HR 124, CBG 158. Pt alert but unable to answer questions, can say "yes" & no", follow simple commands, pt has bilateral foot wounds that appear wrapped and sacral wound with dirty dressing. No Tylenol or motrin given PTA. Pt currently 99% on RA, temp 99.1 oral. Dr. Archie Balboa at the bedside   BED BUGS NOTED

## 2022-11-01 NOTE — ED Notes (Signed)
Attempt for In & Out cath, unable to collect enough urine for UA sample

## 2022-11-01 NOTE — ED Notes (Signed)
Pt to CT scanner at this time, ativan administer for CT scan

## 2022-11-01 NOTE — Progress Notes (Signed)
ANTICOAGULATION CONSULT NOTE - Initial Consult  Pharmacy Consult for Heparin  Indication: DVT  Allergies  Allergen Reactions   Other Anaphylaxis    Anesthisia has been a health issue for her in the past.    Sulfa Antibiotics Rash    Patient Measurements: Height: '4\' 11"'$  (149.9 cm) Weight: 37.2 kg (82 lb) IBW/kg (Calculated) : 43.2 Heparin Dosing Weight: 37.2 kg   Vital Signs: Temp: 100.4 F (38 C) (12/21 2304) Temp Source: Rectal (12/21 2304) BP: 162/92 (12/21 2300) Pulse Rate: 108 (12/21 2300)  Labs: Recent Labs    11/01/22 1947  HGB 9.0*  HCT 30.1*  PLT 624*  CREATININE 0.85    Estimated Creatinine Clearance: 31.5 mL/min (by C-G formula based on SCr of 0.85 mg/dL).   Medical History: Past Medical History:  Diagnosis Date   Arthritis    Arthritis    Breast cancer ([place]) 1992   right breast with lumpectomy and rad tx   Cancer of right breast ([place]) 04/16/2013   right breast with mastectomy   Diabetes mellitus without complication (HCC)    Gout    High cholesterol    Hyperlipidemia    Hypertension    Personal history of radiation therapy     Medications:  (Not in a hospital admission)   Assessment: Pharmacy consulted to dose heparin for DVT treatment in this 81 year old female admitted with sepsis.  No prior anticoag noted. CrCl = 31.5 ml/min  Goal of Therapy:  Heparin level 0.3-0.7 units/ml Monitor platelets by anticoagulation protocol: Yes   Plan:  Give 2200 units bolus x 1 Start heparin infusion at 600 units/hr Check anti-Xa level in 8 hours and daily while on heparin Continue to monitor H&H and platelets  Tayton Decaire D 11/01/2022,11:17 PM

## 2022-11-01 NOTE — ED Notes (Signed)
Pt has urinated on self and bed linens, missed opportunity for urine capture. Pt to be clean and external purwick to be applied

## 2022-11-01 NOTE — ED Provider Notes (Signed)
[facility] Provider Note    Event Date/Time   First MD Initiated Contact with Patient 11/01/22 1936     (approximate)   History   Code Sepsis   HPI  [name] is a 81 y.o. female  who presents to the emergency department today because of concern for confusion and fever.  Patient is unable to give any significant history.     Physical Exam   Triage Vital Signs: ED Triage Vitals  Enc Vitals Group     BP 11/01/22 1942 (!) 149/91     Pulse Rate 11/01/22 1942 (!) 110     Resp 11/01/22 1942 (!) 25     Temp 11/01/22 1942 99.1 F (37.3 C)     Temp Source 11/01/22 1942 Oral     SpO2 11/01/22 1938 100 %     Weight 11/01/22 1954 82 lb (37.2 kg)     Height 11/01/22 1954 '4\' 11"'$  (1.499 m)     Head Circumference --      Peak Flow --      Pain Score --      Pain Loc --      Pain Edu? --      Excl. in [place]? --     Most recent vital signs: Vitals:   11/01/22 1938 11/01/22 1942  BP:  (!) 149/91  Pulse:  (!) 110  Resp:  (!) 25  Temp:  99.1 F (37.3 C)  SpO2: 100% 99%   General: Awake, alert, disoriented. CV:  Tachycardia. Resp:  Normal effort. Tachypnea. Abd:  No distention. Non tender. Skin:  No erythema. Heel wound without surrounding erythema. Sacral wound without surrounding erythema.   ED Results / Procedures / Treatments   Labs (all labs ordered are listed, but only abnormal results are displayed) Labs Reviewed  LACTIC ACID, PLASMA - Abnormal; Notable for the following components:      Result Value   Lactic Acid, Venous 4.9 (*)    All other components within normal limits  LACTIC ACID, PLASMA - Abnormal; Notable for the following components:   Lactic Acid, Venous 5.0 (*)    All other components within normal limits  COMPREHENSIVE METABOLIC PANEL - Abnormal; Notable for the following components:   Potassium 3.1 (*)    CO2 20 (*)    Glucose, Bld 180 (*)    Albumin 3.0 (*)    Anion gap 16 (*)    All other components within  normal limits  CBC WITH DIFFERENTIAL/PLATELET - Abnormal; Notable for the following components:   WBC 17.3 (*)    RBC 3.65 (*)    Hemoglobin 9.0 (*)    HCT 30.1 (*)    MCH 24.7 (*)    MCHC 29.9 (*)    RDW 18.9 (*)    Platelets 624 (*)    Neutro Abs 14.5 (*)    Abs Immature Granulocytes 0.14 (*)    All other components within normal limits  URINALYSIS, COMPLETE (UACMP) WITH MICROSCOPIC - Abnormal; Notable for the following components:   Color, Urine STRAW (*)    APPearance CLEAR (*)    Ketones, ur 5 (*)    All other components within normal limits  RESP PANEL BY RT-PCR (RSV, FLU A&B, COVID)  [id number]  CULTURE, BLOOD (ROUTINE X 2)  CULTURE, BLOOD (ROUTINE X 2)  URINE CULTURE  APTT  PROTIME-INR     EKG  I, Nance Pear, attending physician, personally viewed and interpreted this EKG  EKG Time:  2004 Rate: 103 Rhythm: sinus tachycardia Axis: normal Intervals: qtc 485 QRS: narrow ST changes: no st elevation Impression: abnormal EKG  RADIOLOGY I independently interpreted and visualized the CT abd/pel. My interpretation: No free air. Radiology interpretation:  IMPRESSION:  1. Interval development of left external iliac and femoral vein  thrombus.  2. Trace right pleural effusion  3. Small hiatal hernia.  4. Urinary bladder distended with urine with persistent bilateral  mild hydroureteronephrosis. This may reflect chronic changes of  obstructive uropathy or reflux. Consider urologic consultation.  5. Foci within the urinary bladder lumen suggestive of recent  instrumentation. Correlate with urinalysis for infection if  clinically indicated.  6. Colonic diverticulosis with no acute diverticulitis.  7. Aortic Atherosclerosis (ICD10-I70.0) including coronary and  mitral annular calcifications.    I independently interpreted and visualized the CXR. My interpretation: No pneumonia Radiology interpretation:  IMPRESSION:  Allowing for patient rotation, no evidence of  acute cardiopulmonary  disease.      PROCEDURES:  Critical Care performed: Yes, see critical care procedure note(s)  Procedures  CRITICAL CARE Performed by: Nance Pear   Total critical care time: 35 minutes  Critical care time was exclusive of separately billable procedures and treating other patients.  Critical care was necessary to treat or prevent imminent or life-threatening deterioration.  Critical care was time spent personally by me on the following activities: development of treatment plan with patient and/or surrogate as well as nursing, discussions with consultants, evaluation of patient's response to treatment, examination of patient, obtaining history from patient or surrogate, ordering and performing treatments and interventions, ordering and review of laboratory studies, ordering and review of radiographic studies, pulse oximetry and re-evaluation of patient's condition.   MEDICATIONS ORDERED IN ED: Medications  lactated ringers bolus 1,000 mL (has no administration in time range)  ceFEPIme (MAXIPIME) 2 g in sodium chloride 0.9 % 100 mL IVPB (has no administration in time range)  vancomycin (VANCOCIN) IVPB 1000 mg/200 mL premix (has no administration in time range)     IMPRESSION / MDM / ASSESSMENT AND PLAN / ED COURSE  I reviewed the triage vital signs and the nursing notes.                              Differential diagnosis includes, but is not limited to, pneumonia, COVID, RSV, UTI, diverticulitis.  Patient's presentation is most consistent with acute presentation with potential threat to life or bodily function.  The patient is on the cardiac monitor to evaluate for evidence of arrhythmia and/or significant heart rate changes.  Patient presents to the emergency department today because of concern for fever and confusion. On exam patient is confused. Tachycardic. Did have concern for possible infection so broad spectrum antibiotics and IV fluids were  initiated. No obvious evidence of cellulitis on exam. CXR without obvious pneumonia. UA without UTI. Did obtain a CT of the abdomen given history of diverticulitis which did not show any acute infection. Did show new thrombus of the left external iliac and femoral vein. Discussed with provider Eulogio Ditch with vascular surgery who recommended starting heparin. Discussed with Dr. Damita Dunnings who will plan on admission.     FINAL CLINICAL IMPRESSION(S) / ED DIAGNOSES   Final diagnoses:  Altered mental status, unspecified altered mental status type  Fever, unspecified fever cause  Thrombus      Note:  This document was prepared using Dragon voice recognition software and may include unintentional dictation  errors.    Nance Pear, MD 11/01/22 [phone]

## 2022-11-01 NOTE — ED Notes (Signed)
Pt return from CT scanner, placed back on cardiac monitoring devices.

## 2022-11-01 NOTE — Progress Notes (Addendum)
CLARITY, CISZEK ([id number]) [id number] Page 1 of 9 Visit Report for 10/31/2022 Chief Complaint Document Details Patient Name: Date of Service: [name], [name] [name] 10/31/2022 3:00 PM Medical Record Number: [id number] Patient Account Number: [id number] Date of Birth/Sex: Treating RN: 1941/04/30 (81 y.o. [name] Primary Care Provider: Tracie [name] Other Clinician: Massie [name] Referring Provider: Treating Provider/Extender: [name]: 5 Information Obtained from: Patient Chief Complaint 09/26/2022; left lower extremity wound, right heel wound and left buttocks wound Electronic Signature(s) Signed: 10/31/2022 4:54:10 PM By: Kalman Shan DO Entered By: Kalman Shan on 10/31/2022 16:48:53 -------------------------------------------------------------------------------- HPI Details Patient Name: Date of Service: [facility], [name] [name] 10/31/2022 3:00 PM Medical Record Number: [id number] Patient Account Number: [id number] Date of Birth/Sex: Treating RN: Jan 21, 1941 (81 y.o. [name] Primary Care Provider: Tracie [name] Other Clinician: Massie [name] Referring Provider: Treating Provider/Extender: [name]: 5 History of Present Illness HPI Description: 81 year old patient who comes with a referral for bilateral lower extremity edema and a lower extremity ulceration and has been sent by her PCP Dr. Placido Sou. I understand the patient was recently put on amoxicillin and doxycycline but could not tolerate the amoxicillin. doxycycline course was completed. a BNP and EKG was supposed to be normal and the patient did not have any dyspnea. the patient has been on a diuretic. The patient was also prescribed a pair of elastic compression stockings of the 20-30 mmHg pressure variety. x-ray of the right ankle was done on 09/20/2015 and showed  posttraumatic and postsurgical changes of the right ankle with secondary degenerative changes of the tibiotalar joint and to a lesser degree the subtalar joint. No definite acute bony abnormalities are noted. Past medical history significant for diabetes mellitus, hypertension, hyperlipidemia, right breast cancer treated with a mastectomy in 2014. She has never smoked. 10/24/2015 -- she had delayed her vascular test because of her husband surgery but she is now ready to get him taken care of. He is also unable to use compression stockings and hence we will need to order her Juzo wraps. 10/31/2015-- was seen by Dr. Lucky Cowboy on 10/28/2015. She had a left lower extremity arterial duplex done at his office a couple of years ago and that was essentially normal. Today they performed a venous duplex which revealed no evidence of deep vein thrombosis, superficial thrombophlebitis, no venous reflex was seen on the right and a minimal amount of reflux was seen on the left great saphenous vein but no significant reflux was seen. Impression was that there was a component of lymphedema present from a previous surgery and he would recommend compression stockings and leg elevation. [name] ([id number]) [id number] Page 2 of 9 Readmission: 07-24-2022 upon evaluation today patient presents for initial inspection here in our clinic concerning issues that she has been having with her legs this is actually been going on for several years according to what her family member with her today tells me as well as what the patient reiterates as well. She is currently most recently been seeing Dr. Vickki Muff and subsequently he had her in [place] boots. However 2 weeks ago he referred her to [place] and then subsequently took her out of the [place] boot wraps at that point. At this time the left leg looks to be worse in the right leg currently. She is on Lasix and lisinopril with hydrochlorothiazide she has  high blood pressure she also has issues currently with lower extremity swelling  and edema which has been an ongoing issue for her as well. Patient does have a history of chronic venous hypertension, lymphedema, diabetes mellitus type 2, hypertension, peripheral vascular disease, and neuropathy. Currently she is on Lasix as well as lisinopril with hydrochlorothiazide. 08-02-2022 upon evaluation today patient presents for reevaluation the good news is she is actually doing somewhat better in regard to the wound and the overall appearance and sinuses. The unfortunate thing is her infection really is not significantly improved we did have to switch out her antibiotic once we got that final result back and I switched her to Levaquin and away from the doxycycline. Unfortunately the doxycycline had been doing poorly for her. In fact she had had diarrhea from the time she started taking it on Friday and she is still having it when she shows up today for evaluation. Again I was not aware of this and obviously she does appear to be somewhat dehydrated as well based on what I see. My concern which I discussed with the patient today is the possibility of a C. difficile infection. With that being said fact this started immediately upon taking the doxycycline makes me think that it was just the medicine and is not completely out of her system yet despite having taken the last dose Tuesday morning. Nonetheless with what we are seeing currently I want to be sure that reason I Minna contact her primary care provider and see if a would be willing to see her and test for C. difficile infection. 08-07-2022 upon evaluation today patient appears to be doing well currently in regard to her wounds which are actually measuring much better this is great news. Fortunately I do not see any signs of active infection locally or systemically at this time which is great as well. The good news is she was tested for a C. difficile  infection and it was negative. I am very pleased and thankful for primary care provider for doing that so this means that she was just having a severe reaction to the doxycycline we have added that to her allergy list at this point. 08-14-2022 upon evaluation today patient appears to be doing well currently in regard to her dehydration she is actually significantly improved compared to last time I saw her last week. With that being said I do not see any evidence of active infection systemically at this time which is great news. However locally she still does appear to have cellulitis in left lower extremity. I discussed with her today that I do believe she would benefit from going ahead and starting the [place]. Again we will concerned about the C. difficile infection of the diarrhea but that this turned out to be just an issue with a reaction to the doxycycline. My hope is that the Levaquin will not cause her any complication and will be able to treat the infection. I did review her arterial study as well which was dated on 07-31-2022. It showed that she had a ABI on the right of 1.30 and on the left of 1.27 with a TBI on the right of 1.12 and the left 1.21 this is a normal arterial study. Again on the patient's wound culture she actually did show evidence of Proteus, Morganella, and MRSA. Levaquin is a good option here across the board. 09/26/2022 Ms. Diane Breach is a 80 year old female with a past medical history of type 2 diabetes currently controlled on oral agents, venous insufficiency/lymphedema, right breast cancer and chronic diastolic heart failure  that presents to the clinic for a 1 month history of nonhealing wounds to the left lower extremity, right heel and left buttocks. She states that the buttocks wound and the right heel wound developed while she was in the hospital. She was admitted on 08/22/2022 for severe sepsis secondary to acute sigmoid and distal colonic diverticulitis. At  that time it was noted she had a stage I decubitus ulcer to her bilateral buttocks. A wound to the heel was not mentioned. She currently denies systemic signs of infection. She came into clinic in a blue gown with no undergarments. She has not been dressing the wounds. She states she just recently obtained home health and they are coming out for the first time this week. She is seen in our clinic often for lower extremity wounds secondary to venous insufficiency. 11/22; patient presents for follow-up. Son is present during the encounter. Per son it sounds like they are not doing any dressing changes. She states she has home health but they have not come out. She is going to let us know which home health agency she has been approved for so we can send orders. Currently she denies signs of infection. 12/13; patient presents for follow-up. Patient has home health and they are coming out once a week. It appears that there has not been any dressing changes except for with home health. Patient has a newly discovered wound to the left foot. There is exposed bone. There is slight erythema and increased warmth to the surrounding tissue. Patient is completely unaware of this wound. 12/20; patient presents for follow-up. She has been taking her oral antibiotics. She now has an eschar and wound to the right hip. She has been using Dakin's wet-to-dry dressings to the left foot wound and buttocks wound. She has been using Medihoney and silver alginate to the right heel wound. She currently denies systemic signs of infection. She is mainly bedbound or in a wheelchair. Electronic Signature(s) Signed: 10/31/2022 4:54:10 PM By: Kalman Shan DO Entered By: Kalman Shan on 10/31/2022 16:50:15 -------------------------------------------------------------------------------- Physical Exam Details Patient Name: Date of Service: [facility], [name] [name] 10/31/2022 3:00 PM Medical Record Number: [id number] Patient  Account Number: [id number] Date of Birth/Sex: Treating RN: August 08, 1941 (81 y.o. [name] Primary Care Provider: Tracie [name] Other Clinician: Massie [name] Referring Provider: Treating Provider/Extender: [name]: 5 Constitutional Bo[place], [name] ([id number]) [id number] Page 3 of 9 . Cardiovascular . Psychiatric . Notes T the right heel there is an open wound with nonviable tissue and granulation tissue. T the left buttocks there is a large open wound with nonviable tissue and o o granulation tissue. T the left foot first metatarsal there is an open wound with exposed bone. T the right hip there is a wound with eschar throughout. No signs o o of overt acute infection. Electronic Signature(s) Signed: 10/31/2022 4:54:10 PM By: Kalman Shan DO Entered By: Kalman Shan on 10/31/2022 16:50:56 -------------------------------------------------------------------------------- Physician Orders Details Patient Name: Date of Service: [facility], [name] [name] 10/31/2022 3:00 PM Medical Record Number: [id number] Patient Account Number: [id number] Date of Birth/Sex: Treating RN: 1941/06/06 (81 y.o. [name] Primary Care Provider: Tracie [name] Other Clinician: Massie [name] Referring Provider: Treating Provider/Extender: [name]: 5 Verbal / Phone Orders: No Diagnosis Coding Follow-up Appointments Return Appointment in 1 week. [place] for wound care. May utilize formulary equivalent dressing for wound [name] orders unless otherwise specified. Home  Health Nurse may visit PRN to address patients wound care needs. Alvis Lemmings [phone] Bathing/ Shower/ Hygiene Wash wounds with antibacterial soap and water. Wound [name] Wound #10 - Metatarsal head first Wound Laterality: Left Cleanser: Dakin 16 (oz) 0.25 (Generic)  1 x Per Day/30 Days Discharge Instructions: Use as directed. Cleanser: Soap and Water 1 x Per Day/30 Days Discharge Instructions: Gently cleanse wound with antibacterial soap, rinse and pat dry prior to dressing wounds Prim Dressing: Gauze 1 x Per Day/30 Days ary Discharge Instructions: moistened with Dakins Solution Secondary Dressing: ABD Pad 5x9 (in/in) 1 x Per Day/30 Days Discharge Instructions: Cover with ABD pad Secured With: Medipore T - 39M Medipore H Soft Cloth Surgical T ape ape, 2x2 (in/yd) 1 x Per Day/30 Days Wound #11 - Ischial Tuberosity Wound Laterality: Right Cleanser: Dakin 16 (oz) 0.25 (Generic) 1 x Per Day/30 Days Discharge Instructions: Use as directed. Cleanser: Soap and Water 1 x Per Day/30 Days Discharge Instructions: Gently cleanse wound with antibacterial soap, rinse and pat dry prior to dressing wounds Eimer, Jovanni L ([id number]) [id number] Page 4 of 9 Prim Dressing: Honey: Activon Honey Gel, 25 (g) Tube ary 1 x Per Day/30 Days Secondary Dressing: Gauze 1 x Per Day/30 Days Discharge Instructions: As directed: dry, moistened with saline or moistened with Dakins Solution Secondary Dressing: (BORDER) Zetuvit Plus SILICONE BORDER Dressing 5x5 (in/in) 1 x Per Day/30 Days Discharge Instructions: Please do not put silicone bordered dressings under wraps. Use non-bordered dressing only. Secured With: Medipore T - 39M Medipore H Soft Cloth Surgical T ape ape, 2x2 (in/yd) 1 x Per Day/30 Days Wound #6 - Calcaneus Wound Laterality: Right Cleanser: Byram Ancillary Kit - 15 Day Supply (Generic) 3 x Per Week/30 Days Discharge Instructions: Use supplies as instructed; Kit contains: (15) Saline Bullets; (15) 3x3 Gauze; 15 pr Gloves Cleanser: Dakin 16 (oz) 0.25 (Dispense As Written) 3 x Per Week/30 Days Discharge Instructions: Use as directed. Cleanser: Soap and Water 3 x Per Week/30 Days Discharge Instructions: Gently cleanse wound with  antibacterial soap, rinse and pat dry prior to dressing wounds Topical: Activon Honey Gel, 25 (g) Tube (Dispense As Written) 3 x Per Week/30 Days Prim Dressing: Hydrofera Blue Ready Transfer Foam, 2.5x2.5 (in/in) 3 x Per Week/30 Days ary Discharge Instructions: Apply Hydrofera Blue Ready to wound bed as directed Secondary Dressing: ABD Pad 5x9 (in/in) 3 x Per Week/30 Days Discharge Instructions: Cover with ABD pad Secondary Dressing: Kerlix 4.5 x 4.1 (in/yd) 3 x Per Week/30 Days Discharge Instructions: Apply Kerlix 4.5 x 4.1 (in/yd) as instructed Secured With: Medipore T - 39M Medipore H Soft Cloth Surgical T ape ape, 2x2 (in/yd) 3 x Per Week/30 Days Wound #8 - Gluteus Wound Laterality: Left, Distal Cleanser: Dakin 16 (oz) 0.25 (Generic) 1 x Per Day/30 Days Discharge Instructions: Use as directed. Cleanser: Soap and Water 1 x Per Day/30 Days Discharge Instructions: Gently cleanse wound with antibacterial soap, rinse and pat dry prior to dressing wounds Prim Dressing: Gauze 1 x Per Day/30 Days ary Discharge Instructions: moistened with Dakins Solution Secondary Dressing: ABD Pad 5x9 (in/in) 1 x Per Day/30 Days Discharge Instructions: Cover with ABD pad Secured With: Medipore T - 39M Medipore H Soft Cloth Surgical T ape ape, 2x2 (in/yd) 1 x Per Day/30 Days Patient Medications llergies: doxycycline, Sulfa (Sulfonamide Antibiotics) A Notifications Medication Indication Start End 10/31/2022 amoxicillin-pot clavulanate DOSE 1 - oral 875 mg-125 mg tablet - 1 tablet oral twice a day x 14 days Electronic Signature(s) Signed: 11/01/2022 2:00:40 PM  By: Kalman Shan DO Signed: 11/02/2022 9:48:27 AM By: [name] Previous Signature: 10/31/2022 4:55:15 PM Version By: Kalman Shan DO Previous Signature: 10/31/2022 4:54:10 PM Version By: Kalman Shan DO Entered By: [name] on 10/31/2022 17:29:21 [place], [name] ([id number]) [id number] Page 5  of 9 -------------------------------------------------------------------------------- Problem List Details Patient Name: Date of Service: [facility], [name] [name] 10/31/2022 3:00 PM Medical Record Number: [id number] Patient Account Number: [id number] Date of Birth/Sex: Treating RN: 1941-09-30 (81 y.o. [name] Primary Care Provider: Tracie [name] Other Clinician: Massie [name] Referring Provider: Treating Provider/Extender: [name]: 5 Active Problems ICD-10 Encounter Code Description Active Date MDM Diagnosis [phone] Non-pressure chronic ulcer of other part of left lower leg with fat layer 09/26/2022 No Yes exposed I87.312 Chronic venous hypertension (idiopathic) with ulcer of left lower extremity 09/26/2022 No Yes I89.0 Lymphedema, not elsewhere classified 09/26/2022 No Yes E11.622 Type 2 diabetes mellitus with other skin ulcer 09/26/2022 No Yes U88.28 Chronic diastolic (congestive) heart failure 09/26/2022 No Yes L89.320 Pressure ulcer of left buttock, unstageable 09/26/2022 No Yes L89.610 Pressure ulcer of right heel, unstageable 09/26/2022 No Yes S91.302A Unspecified open wound, left foot, initial encounter 10/24/2022 No Yes M86.172 Other acute osteomyelitis, left ankle and foot 10/24/2022 No Yes Inactive Problems Resolved Problems Electronic Signature(s) Signed: 10/31/2022 4:54:10 PM By: Kalman Shan DO Entered By: Kalman Shan on 10/31/2022 16:48:49 [place], [name] ([id number]) [id number] Page 6 of 9 -------------------------------------------------------------------------------- Progress Note Details Patient Name: Date of Service: [facility], [name] [name] 10/31/2022 3:00 PM Medical Record Number: [id number] Patient Account Number: [id number] Date of Birth/Sex: Treating RN: 26-Feb-1941 (81 y.o. [name] Primary Care Provider: Tracie [name] Other Clinician: Massie [name] Referring Provider: Treating Provider/Extender: [name]: 5 Subjective Chief Complaint Information obtained from Patient 09/26/2022; left lower extremity wound, right heel wound and left buttocks wound History of Present Illness (HPI) 81 year old patient who comes with a referral for bilateral lower extremity edema and a lower extremity ulceration and has been sent by her PCP Dr. Placido Sou. I understand the patient was recently put on amoxicillin and doxycycline but could not tolerate the amoxicillin. doxycycline course was completed. a BNP and EKG was supposed to be normal and the patient did not have any dyspnea. the patient has been on a diuretic. The patient was also prescribed a pair of elastic compression stockings of the 20-30 mmHg pressure variety. x-ray of the right ankle was done on 09/20/2015 and showed posttraumatic and postsurgical changes of the right ankle with secondary degenerative changes of the tibiotalar joint and to a lesser degree the subtalar joint. No definite acute bony abnormalities are noted. Past medical history significant for diabetes mellitus, hypertension, hyperlipidemia, right breast cancer treated with a mastectomy in 2014. She has never smoked. 10/24/2015 -- she had delayed her vascular test because of her husband surgery but she is now ready to get him taken care of. He is also unable to use compression stockings and hence we will need to order her Juzo wraps. 10/31/2015-- was seen by Dr. Lucky Cowboy on 10/28/2015. She had a left lower extremity arterial duplex done at his office a couple of years ago and that was essentially normal. Today they performed a venous duplex which revealed no evidence of deep vein thrombosis, superficial thrombophlebitis, no venous reflex was seen on the right and a minimal amount of reflux was seen on the left great saphenous vein but no significant reflux was seen. Impression  was that  there was a component of lymphedema present from a previous surgery and he would recommend compression stockings and leg elevation. Readmission: 07-24-2022 upon evaluation today patient presents for initial inspection here in our clinic concerning issues that she has been having with her legs this is actually been going on for several years according to what her family member with her today tells me as well as what the patient reiterates as well. She is currently most recently been seeing Dr. Vickki Muff and subsequently he had her in [place] boots. However 2 weeks ago he referred her to [place] and then subsequently took her out of the [place] boot wraps at that point. At this time the left leg looks to be worse in the right leg currently. She is on Lasix and lisinopril with hydrochlorothiazide she has high blood pressure she also has issues currently with lower extremity swelling and edema which has been an ongoing issue for her as well. Patient does have a history of chronic venous hypertension, lymphedema, diabetes mellitus type 2, hypertension, peripheral vascular disease, and neuropathy. Currently she is on Lasix as well as lisinopril with hydrochlorothiazide. 08-02-2022 upon evaluation today patient presents for reevaluation the good news is she is actually doing somewhat better in regard to the wound and the overall appearance and sinuses. The unfortunate thing is her infection really is not significantly improved we did have to switch out her antibiotic once we got that final result back and I switched her to Levaquin and away from the doxycycline. Unfortunately the doxycycline had been doing poorly for her. In fact she had had diarrhea from the time she started taking it on Friday and she is still having it when she shows up today for evaluation. Again I was not aware of this and obviously she does appear to be somewhat dehydrated as well based on what I see. My concern which I discussed with the patient  today is the possibility of a C. difficile infection. With that being said fact this started immediately upon taking the doxycycline makes me think that it was just the medicine and is not completely out of her system yet despite having taken the last dose Tuesday morning. Nonetheless with what we are seeing currently I want to be sure that reason I Minna contact her primary care provider and see if a would be willing to see her and test for C. difficile infection. 08-07-2022 upon evaluation today patient appears to be doing well currently in regard to her wounds which are actually measuring much better this is great news. Fortunately I do not see any signs of active infection locally or systemically at this time which is great as well. The good news is she was tested for a C. difficile infection and it was negative. I am very pleased and thankful for primary care provider for doing that so this means that she was just having a severe reaction to the doxycycline we have added that to her allergy list at this point. 08-14-2022 upon evaluation today patient appears to be doing well currently in regard to her dehydration she is actually significantly improved compared to last time I saw her last week. With that being said I do not see any evidence of active infection systemically at this time which is great news. However locally she still does appear to have cellulitis in left lower extremity. I discussed with her today that I do believe she would benefit from going ahead and starting the [place]. Again  we will concerned about the C. difficile infection of the diarrhea but that this turned out to be just an issue with a reaction to the doxycycline. My hope is that the Levaquin will not cause her any complication and will be able to treat the infection. I did review her arterial study as well which was dated on 07-31-2022. It showed that she had a ABI on the right of 1.30 and on the left of 1.27 with a TBI on  the right of 1.12 and the left 1.21 this is a normal arterial study. Again on the patient's wound culture she actually did show evidence of Proteus, Morganella, and MRSA. Levaquin is a good option here across the board. 09/26/2022 Ms. Diane Raimondi is a 81 year old female with a past medical history of type 2 diabetes currently controlled on oral agents, venous insufficiency/lymphedema, right breast cancer and chronic diastolic heart failure that presents to the clinic for a 1 month history of nonhealing wounds to the left lower extremity, right heel and left buttocks. She states that the buttocks wound and the right heel wound developed while she was in the hospital. She was admitted on 08/22/2022 for severe sepsis secondary to acute sigmoid and distal colonic diverticulitis. At that time it was noted she had a stage I decubitus ulcer to her bilateral buttocks. A wound to the heel was not mentioned. She currently denies systemic signs of infection. She came into clinic in a blue gown TAYLAR, HARTSOUGH L ([id number]) [id number].pdf Page 7 of 9 with no undergarments. She has not been dressing the wounds. She states she just recently obtained home health and they are coming out for the first time this week. She is seen in our clinic often for lower extremity wounds secondary to venous insufficiency. 11/22; patient presents for follow-up. Son is present during the encounter. Per son it sounds like they are not doing any dressing changes. She states she has home health but they have not come out. She is going to let us know which home health agency she has been approved for so we can send orders. Currently she denies signs of infection. 12/13; patient presents for follow-up. Patient has home health and they are coming out once a week. It appears that there has not been any dressing changes except for with home health. Patient has a newly discovered wound to the left foot. There  is exposed bone. There is slight erythema and increased warmth to the surrounding tissue. Patient is completely unaware of this wound. 12/20; patient presents for follow-up. She has been taking her oral antibiotics. She now has an eschar and wound to the right hip. She has been using Dakin's wet-to-dry dressings to the left foot wound and buttocks wound. She has been using Medihoney and silver alginate to the right heel wound. She currently denies systemic signs of infection. She is mainly bedbound or in a wheelchair. Objective Constitutional Vitals Time Taken: 3:50 PM, Weight: 116 lbs, Temperature: 97.8 F, Pulse: 99 bpm, Respiratory Rate: 16 breaths/min, Blood Pressure: 144/82 mmHg. General Notes: T the right heel there is an open wound with nonviable tissue and granulation tissue. T the left buttocks there is a large open wound with o o nonviable tissue and granulation tissue. T the left foot first metatarsal there is an open wound with exposed bone. T the right hip there is a wound with eschar o o throughout. No signs of overt acute infection. Integumentary (Hair, Skin) Wound #10 status is Open. Original cause of  wound was Pressure Injury. The date acquired was: 10/23/2022. The wound has been in [name] 1 weeks. The wound is located on the Left Metatarsal head first. The wound measures 1.5cm length x 1.5cm width x 0.1cm depth; 1.767cm^2 area and 0.177cm^3 volume. There is bone exposed. There is a large amount of serosanguineous drainage noted. The wound margin is flat and intact. There is no granulation within the wound bed. There is a medium (34-66%) amount of necrotic tissue within the wound bed including Necrosis of Bone. Wound #11 status is Open. Original cause of wound was Pressure Injury. The date acquired was: 10/24/2022. The wound is located on the Right Ischial Tuberosity. The wound measures 1.2cm length x 2.2cm width x 0.1cm depth; 2.073cm^2 area and 0.207cm^3 volume. There is no  tunneling or undermining noted. There is a none present amount of drainage noted. The wound margin is flat and intact. There is a medium (34-66%) amount of necrotic tissue within the wound bed including Eschar. Wound #6 status is Open. Original cause of wound was Pressure Injury. The date acquired was: 07/23/2022. The wound has been in [name] 5 weeks. The wound is located on the Right Calcaneus. The wound measures 3.6cm length x 3cm width x 0.1cm depth; 8.482cm^2 area and 0.848cm^3 volume. There is a medium amount of serosanguineous drainage noted. Foul odor after cleansing was noted. There is no granulation within the wound bed. There is a large (67- 100%) amount of necrotic tissue within the wound bed including Eschar. Wound #8 status is Open. Original cause of wound was Pressure Injury. The date acquired was: 07/23/2022. The wound has been in [name] 5 weeks. The wound is located on the Left,Distal Gluteus. The wound measures 2cm length x 2.4cm width x 1.3cm depth; 3.77cm^2 area and 4.901cm^3 volume. There is no tunneling noted, however, there is undermining starting at 9:00 and ending at 5:00 with a maximum distance of 1.9cm. There is a medium amount of serosanguineous drainage noted. Foul odor after cleansing was noted. There is no granulation within the wound bed. There is a large (67-100%) amount of necrotic tissue within the wound bed including Adherent Slough. Assessment Active Problems ICD-10 Non-pressure chronic ulcer of other part of left lower leg with fat layer exposed Chronic venous hypertension (idiopathic) with ulcer of left lower extremity Lymphedema, not elsewhere classified Type 2 diabetes mellitus with other skin ulcer Chronic diastolic (congestive) heart failure Pressure ulcer of left buttock, unstageable Pressure ulcer of right heel, unstageable Unspecified open wound, left foot, initial encounter Other acute osteomyelitis, left ankle and foot Patient's wounds are  stable. She has a new wound to the right hip with eschar. For now I recommended continuing the course with Dakin's wet-to-dry dressings to the left buttocks and left foot wound. Continue Medihoney to the right heel and add Hydrofera Blue. Start Medihoney to the right hip. Continue oral antibiotics for osteomyelitis of the left foot. Unfortunately patient keeps developing new wounds. She has a failure to thrive picture. She may need palliative care in the near future. Plan Follow-up Appointments: ALASHA, MCGUINNESS ([id number]) [id number] Page 8 of 9 Return Appointment in 1 week. Home Health: [facility] for wound care. May utilize formulary equivalent dressing for wound [name] orders unless otherwise specified. Home Health Nurse may visit PRN to address patientoos wound care needs. Alvis Lemmings [phone] Bathing/ Shower/ Hygiene: Wash wounds with antibacterial soap and water. WOUND #10: - Metatarsal head first Wound Laterality: Left Cleanser: Dakin 16 (oz) 0.25 (Generic) 1 x Per  Day/30 Days Discharge Instructions: Use as directed. Cleanser: Soap and Water 1 x Per Day/30 Days Discharge Instructions: Gently cleanse wound with antibacterial soap, rinse and pat dry prior to dressing wounds Prim Dressing: Gauze 1 x Per Day/30 Days ary Discharge Instructions: moistened with Dakins Solution Secondary Dressing: ABD Pad 5x9 (in/in) 1 x Per Day/30 Days Discharge Instructions: Cover with ABD pad Secured With: Medipore T - 58M Medipore H Soft Cloth Surgical T ape ape, 2x2 (in/yd) 1 x Per Day/30 Days WOUND #11: - Ischial Tuberosity Wound Laterality: Right Cleanser: Dakin 16 (oz) 0.25 (Generic) 1 x Per Day/30 Days Discharge Instructions: Use as directed. Cleanser: Soap and Water 1 x Per Day/30 Days Discharge Instructions: Gently cleanse wound with antibacterial soap, rinse and pat dry prior to dressing wounds Prim Dressing: Honey: Activon Honey Gel, 25 (g) Tube  1 x Per Day/30 Days ary Secondary Dressing: Gauze 1 x Per Day/30 Days Discharge Instructions: As directed: dry, moistened with saline or moistened with Dakins Solution Secondary Dressing: (BORDER) Zetuvit Plus SILICONE BORDER Dressing 5x5 (in/in) 1 x Per Day/30 Days Discharge Instructions: Please do not put silicone bordered dressings under wraps. Use non-bordered dressing only. Secured With: Medipore T - 58M Medipore H Soft Cloth Surgical T ape ape, 2x2 (in/yd) 1 x Per Day/30 Days WOUND #6: - Calcaneus Wound Laterality: Right Cleanser: Byram Ancillary Kit - 15 Day Supply (Generic) 3 x Per Week/30 Days Discharge Instructions: Use supplies as instructed; Kit contains: (15) Saline Bullets; (15) 3x3 Gauze; 15 pr Gloves Cleanser: Dakin 16 (oz) 0.25 (Dispense As Written) 3 x Per Week/30 Days Discharge Instructions: Use as directed. Cleanser: Soap and Water 3 x Per Week/30 Days Discharge Instructions: Gently cleanse wound with antibacterial soap, rinse and pat dry prior to dressing wounds Topical: Activon Honey Gel, 25 (g) Tube (Dispense As Written) 3 x Per Week/30 Days Prim Dressing: Hydrofera Blue Ready Transfer Foam, 2.5x2.5 (in/in) 3 x Per Week/30 Days ary Discharge Instructions: Apply Hydrofera Blue Ready to wound bed as directed Secondary Dressing: ABD Pad 5x9 (in/in) 3 x Per Week/30 Days Discharge Instructions: Cover with ABD pad Secondary Dressing: Kerlix 4.5 x 4.1 (in/yd) 3 x Per Week/30 Days Discharge Instructions: Apply Kerlix 4.5 x 4.1 (in/yd) as instructed Secured With: Medipore T - 58M Medipore H Soft Cloth Surgical T ape ape, 2x2 (in/yd) 3 x Per Week/30 Days WOUND #8: - Gluteus Wound Laterality: Left, Distal Cleanser: Dakin 16 (oz) 0.25 (Generic) 1 x Per Day/30 Days Discharge Instructions: Use as directed. Cleanser: Soap and Water 1 x Per Day/30 Days Discharge Instructions: Gently cleanse wound with antibacterial soap, rinse and pat dry prior to dressing wounds Prim Dressing:  Gauze 1 x Per Day/30 Days ary Discharge Instructions: moistened with Dakins Solution Secondary Dressing: ABD Pad 5x9 (in/in) 1 x Per Day/30 Days Discharge Instructions: Cover with ABD pad Secured With: Medipore T - 58M Medipore H Soft Cloth Surgical T ape ape, 2x2 (in/yd) 1 x Per Day/30 Days 1. Dakin's wet-to-dry dressings 2. Aggressive offloading 3. Medihoney 4. Hydrofera Blue 5. Continue Augmentin and doxycycline Electronic Signature(s) Signed: 10/31/2022 4:54:10 PM By: Kalman Shan DO Entered By: Kalman Shan on 10/31/2022 16:52:51 -------------------------------------------------------------------------------- SuperBill Details Patient Name: Date of Service: [facility], [name] [name] 10/31/2022 Medical Record Number: [id number] Patient Account Number: [id number] Date of Birth/Sex: Treating RN: 29-Dec-1940 (81 y.o. [name] Primary Care Provider: Tracie [name] Other Clinician: Kaoir, Loree ([id number]) [id number] Page 9 of 9 Referring Provider: Treating Provider/Extender: [name] Medal  Weeks in [name]: 5 Diagnosis Coding ICD-10 Codes Code Description [phone] Non-pressure chronic ulcer of other part of left lower leg with fat layer exposed I87.312 Chronic venous hypertension (idiopathic) with ulcer of left lower extremity I89.0 Lymphedema, not elsewhere classified E11.622 Type 2 diabetes mellitus with other skin ulcer H21.22 Chronic diastolic (congestive) heart failure L89.320 Pressure ulcer of left buttock, unstageable L89.610 Pressure ulcer of right heel, unstageable S91.302A Unspecified open wound, left foot, initial encounter M86.172 Other acute osteomyelitis, left ankle and foot Physician Procedures : CPT4 Code Description Modifier [id number] - WC PHYS LEVEL 3 - EST PT ICD-10 Diagnosis Description E11.622 Type 2 diabetes mellitus with other skin ulcer L89.320 Pressure ulcer of left  buttock, unstageable L89.610 Pressure ulcer of right heel,  unstageable M86.172 Other acute osteomyelitis, left ankle and foot Quantity: 1 Electronic Signature(s) Signed: 10/31/2022 4:54:10 PM By: Kalman Shan DO Entered By: Kalman Shan on 10/31/2022 16:53:40

## 2022-11-02 ENCOUNTER — Encounter: Payer: Self-pay | Admitting: Internal Medicine

## 2022-11-02 ENCOUNTER — Inpatient Hospital Stay: Payer: PPO

## 2022-11-02 ENCOUNTER — Other Ambulatory Visit: Payer: Self-pay

## 2022-11-02 ENCOUNTER — Inpatient Hospital Stay: Payer: PPO | Admitting: Anesthesiology

## 2022-11-02 ENCOUNTER — Encounter
Admission: EM | Disposition: A | Discharge status: Nursing Home (Includes ICF) | Payer: Self-pay | Source: Skilled Nursing Facility | Attending: Internal Medicine

## 2022-11-02 DIAGNOSIS — R339 Retention of urine, unspecified: Secondary | ICD-10-CM | POA: Insufficient documentation

## 2022-11-02 DIAGNOSIS — R6520 Severe sepsis without septic shock: Secondary | ICD-10-CM

## 2022-11-02 DIAGNOSIS — N390 Urinary tract infection, site not specified: Secondary | ICD-10-CM | POA: Diagnosis not present

## 2022-11-02 DIAGNOSIS — A419 Sepsis, unspecified organism: Secondary | ICD-10-CM

## 2022-11-02 DIAGNOSIS — D649 Anemia, unspecified: Secondary | ICD-10-CM | POA: Insufficient documentation

## 2022-11-02 DIAGNOSIS — I8290 Acute embolism and thrombosis of unspecified vein: Secondary | ICD-10-CM

## 2022-11-02 DIAGNOSIS — D631 Anemia in chronic kidney disease: Secondary | ICD-10-CM | POA: Insufficient documentation

## 2022-11-02 DIAGNOSIS — L97419 Non-pressure chronic ulcer of right heel and midfoot with unspecified severity: Secondary | ICD-10-CM

## 2022-11-02 DIAGNOSIS — I82412 Acute embolism and thrombosis of left femoral vein: Secondary | ICD-10-CM | POA: Diagnosis not present

## 2022-11-02 DIAGNOSIS — I82422 Acute embolism and thrombosis of left iliac vein: Secondary | ICD-10-CM | POA: Diagnosis not present

## 2022-11-02 DIAGNOSIS — N1831 Chronic kidney disease, stage 3a: Secondary | ICD-10-CM | POA: Insufficient documentation

## 2022-11-02 DIAGNOSIS — L97522 Non-pressure chronic ulcer of other part of left foot with fat layer exposed: Secondary | ICD-10-CM | POA: Diagnosis present

## 2022-11-02 HISTORY — PX: IRRIGATION AND DEBRIDEMENT FOOT: SHX6602

## 2022-11-02 LAB — COMPREHENSIVE METABOLIC PANEL
ALT: 7 U/L (ref 0–44)
AST: 23 U/L (ref 15–41)
Albumin: 2.6 g/dL — ABNORMAL LOW (ref 3.5–5.0)
Alkaline Phosphatase: 56 U/L (ref 38–126)
Anion gap: 10 (ref 5–15)
BUN: 9 mg/dL (ref 8–23)
CO2: 23 mmol/L (ref 22–32)
Calcium: 8.5 mg/dL — ABNORMAL LOW (ref 8.9–10.3)
Chloride: 102 mmol/L (ref 98–111)
Creatinine, Ser: 0.69 mg/dL (ref 0.44–1.00)
GFR, Estimated: >60 mL/min (ref >60)
Glucose, Bld: 129 mg/dL — ABNORMAL HIGH (ref 70–99)
Potassium: 3.4 mmol/L — ABNORMAL LOW (ref 3.5–5.1)
Sodium: 135 mmol/L (ref 135–145)
Total Bilirubin: 0.7 mg/dL (ref 0.3–1.2)
Total Protein: 6.6 g/dL (ref 6.5–8.1)

## 2022-11-02 LAB — CBC
HCT: 27.6 % — ABNORMAL LOW (ref 36.0–46.0)
Hemoglobin: 8.3 g/dL — ABNORMAL LOW (ref 12.0–15.0)
MCH: 24.4 pg — ABNORMAL LOW (ref 26.0–34.0)
MCHC: 30.1 g/dL (ref 30.0–36.0)
MCV: 81.2 fL (ref 80.0–100.0)
Platelets: 514 10*3/uL — ABNORMAL HIGH (ref 150–400)
RBC: 3.4 MIL/uL — ABNORMAL LOW (ref 3.87–5.11)
RDW: 19.1 % — ABNORMAL HIGH (ref 11.5–15.5)
WBC: 15.1 10*3/uL — ABNORMAL HIGH (ref 4.0–10.5)
nRBC: 0 % (ref 0.0–0.2)

## 2022-11-02 LAB — CBG MONITORING, ED
Glucose-Capillary: 158 mg/dL — ABNORMAL HIGH (ref 70–99)
Glucose-Capillary: 124 mg/dL — ABNORMAL HIGH (ref 70–99)
Glucose-Capillary: 129 mg/dL — ABNORMAL HIGH (ref 70–99)
Glucose-Capillary: 132 mg/dL — ABNORMAL HIGH (ref 70–99)
Glucose-Capillary: 109 mg/dL — ABNORMAL HIGH (ref 70–99)
Glucose-Capillary: 98 mg/dL (ref 70–99)

## 2022-11-02 LAB — HEPARIN LEVEL (UNFRACTIONATED)
Heparin Unfractionated: <0.1 IU/mL — ABNORMAL LOW (ref 0.30–0.70)
Heparin Unfractionated: 0.15 IU/mL — ABNORMAL LOW (ref 0.30–0.70)

## 2022-11-02 LAB — LACTIC ACID, PLASMA
Lactic Acid, Venous: 1.6 mmol/L (ref 0.5–1.9)
Lactic Acid, Venous: 1.7 mmol/L (ref 0.5–1.9)

## 2022-11-02 LAB — CORTISOL-AM, BLOOD: Cortisol - AM: 27.9 ug/dL — ABNORMAL HIGH (ref 6.7–22.6)

## 2022-11-02 LAB — PROCALCITONIN: Procalcitonin: 0.15 ng/mL

## 2022-11-02 LAB — MRSA NEXT GEN BY PCR, NASAL: MRSA by PCR Next Gen: NOT DETECTED

## 2022-11-02 SURGERY — IRRIGATION AND DEBRIDEMENT FOOT
Anesthesia: General | Site: Foot | Laterality: Right

## 2022-11-02 MED ORDER — CHLORHEXIDINE GLUCONATE 4 % EX LIQD: 60.0000 mL | Freq: Once | CUTANEOUS | Status: DC

## 2022-11-02 MED ORDER — INSULIN ASPART 100 UNIT/ML IJ SOLN
0.0000 [IU] | INTRAMUSCULAR | Status: DC
  Administered 2022-11-02 (×3): 1 [IU] via SUBCUTANEOUS
  Administered 2022-11-02: 2 [IU] via SUBCUTANEOUS
  Administered 2022-11-06: 1 [IU] via SUBCUTANEOUS
  Administered 2022-11-06: 2 [IU] via SUBCUTANEOUS
  Administered 2022-11-07 (×2): 1 [IU] via SUBCUTANEOUS
  Filled 2022-11-02 (×7): qty 1

## 2022-11-02 MED ORDER — FENTANYL CITRATE (PF) 100 MCG/2ML IJ SOLN: 25.0000 ug | INTRAMUSCULAR | Status: DC | PRN

## 2022-11-02 MED ORDER — LIDOCAINE-EPINEPHRINE 1 %-1:100000 IJ SOLN
INTRAMUSCULAR | Status: AC
  Filled 2022-11-02: qty 1

## 2022-11-02 MED ORDER — HYDROCODONE-ACETAMINOPHEN 7.5-325 MG PO TABS
1.0000 | ORAL_TABLET | Freq: Two times a day (BID) | ORAL | Status: DC | PRN
  Administered 2022-11-04 – 2022-11-08 (×5): 1 via ORAL
  Filled 2022-11-02 (×5): qty 1

## 2022-11-02 MED ORDER — ALLOPURINOL 100 MG PO TABS
300.0000 mg | ORAL_TABLET | Freq: Every day | ORAL | Status: DC
  Administered 2022-11-02 – 2022-11-09 (×6): 300 mg via ORAL
  Filled 2022-11-02 (×4): qty 3
  Filled 2022-11-02: qty 1
  Filled 2022-11-02: qty 3

## 2022-11-02 MED ORDER — SODIUM CHLORIDE 0.9 % IV SOLN: 2.0000 g | Freq: Two times a day (BID) | INTRAVENOUS | Status: DC

## 2022-11-02 MED ORDER — METRONIDAZOLE 500 MG/100ML IV SOLN
500.0000 mg | Freq: Two times a day (BID) | INTRAVENOUS | Status: DC
  Administered 2022-11-02 – 2022-11-07 (×11): 500 mg via INTRAVENOUS
  Filled 2022-11-02 (×12): qty 100

## 2022-11-02 MED ORDER — LEVOTHYROXINE SODIUM 88 MCG PO TABS
88.0000 ug | ORAL_TABLET | Freq: Every day | ORAL | Status: DC
  Administered 2022-11-02 – 2022-11-09 (×6): 88 ug via ORAL
  Filled 2022-11-02 (×8): qty 1

## 2022-11-02 MED ORDER — 0.9 % SODIUM CHLORIDE (POUR BTL) OPTIME
TOPICAL | Status: DC | PRN
  Administered 2022-11-02: 1000 mL

## 2022-11-02 MED ORDER — ACETAMINOPHEN 500 MG PO TABS: 500.0000 mg | ORAL_TABLET | Freq: Four times a day (QID) | ORAL | Status: DC | PRN

## 2022-11-02 MED ORDER — LACTATED RINGERS IV BOLUS (SEPSIS)
500.0000 mL | Freq: Once | INTRAVENOUS | Status: AC
  Administered 2022-11-02: 500 mL via INTRAVENOUS

## 2022-11-02 MED ORDER — LACTATED RINGERS IV SOLN: INTRAVENOUS | Status: DC

## 2022-11-02 MED ORDER — LIDOCAINE-EPINEPHRINE 1 %-1:100000 IJ SOLN
INTRAMUSCULAR | Status: DC | PRN
  Administered 2022-11-02: 8 mL

## 2022-11-02 MED ORDER — VANCOMYCIN HCL 750 MG/150ML IV SOLN
750.0000 mg | INTRAVENOUS | Status: DC
  Administered 2022-11-03: 750 mg via INTRAVENOUS
  Filled 2022-11-02 (×2): qty 150

## 2022-11-02 MED ORDER — HEPARIN BOLUS VIA INFUSION
1000.0000 [IU] | Freq: Once | INTRAVENOUS | Status: AC
  Administered 2022-11-02: 1000 [IU] via INTRAVENOUS
  Filled 2022-11-02: qty 1000

## 2022-11-02 MED ORDER — ACETAMINOPHEN 325 MG PO TABS
650.0000 mg | ORAL_TABLET | Freq: Four times a day (QID) | ORAL | Status: DC | PRN
  Administered 2022-11-02 – 2022-11-06 (×3): 650 mg via ORAL
  Filled 2022-11-02 (×3): qty 2

## 2022-11-02 MED ORDER — PROPOFOL 500 MG/50ML IV EMUL
INTRAVENOUS | Status: DC | PRN
  Administered 2022-11-02: 50 ug/kg/min via INTRAVENOUS

## 2022-11-02 MED ORDER — POVIDONE-IODINE 10 % EX SWAB
2.0000 | Freq: Once | CUTANEOUS | Status: DC
  Filled 2022-11-02: qty 2

## 2022-11-02 MED ORDER — SODIUM CHLORIDE 0.9 % IV SOLN
2.0000 g | Freq: Two times a day (BID) | INTRAVENOUS | Status: DC
  Administered 2022-11-02 – 2022-11-05 (×7): 2 g via INTRAVENOUS
  Filled 2022-11-02 (×7): qty 12.5
  Filled 2022-11-02: qty 2
  Filled 2022-11-02 (×2): qty 12.5

## 2022-11-02 MED ORDER — LACTATED RINGERS IV SOLN: INTRAVENOUS | Status: AC

## 2022-11-02 MED ORDER — SODIUM CHLORIDE 0.9 % IV SOLN: INTRAVENOUS | Status: DC | PRN

## 2022-11-02 MED ORDER — LACTATED RINGERS IV SOLN: INTRAVENOUS | Status: DC | PRN

## 2022-11-02 MED ORDER — ONDANSETRON HCL 4 MG PO TABS
4.0000 mg | ORAL_TABLET | Freq: Four times a day (QID) | ORAL | Status: DC | PRN
  Filled 2022-11-02: qty 1

## 2022-11-02 MED ORDER — PHENYLEPHRINE 80 MCG/ML (10ML) SYRINGE FOR IV PUSH (FOR BLOOD PRESSURE SUPPORT)
PREFILLED_SYRINGE | INTRAVENOUS | Status: AC
  Filled 2022-11-02: qty 10

## 2022-11-02 MED ORDER — POTASSIUM CHLORIDE 10 MEQ/100ML IV SOLN
10.0000 meq | INTRAVENOUS | Status: AC
  Administered 2022-11-02 (×2): 10 meq via INTRAVENOUS
  Filled 2022-11-02 (×2): qty 100

## 2022-11-02 MED ORDER — ONDANSETRON HCL 4 MG/2ML IJ SOLN: 4.0000 mg | Freq: Four times a day (QID) | INTRAMUSCULAR | Status: DC | PRN

## 2022-11-02 MED ORDER — LISINOPRIL 5 MG PO TABS
5.0000 mg | ORAL_TABLET | Freq: Every day | ORAL | Status: DC
  Administered 2022-11-02 – 2022-11-09 (×6): 5 mg via ORAL
  Filled 2022-11-02 (×6): qty 1

## 2022-11-02 MED ORDER — VANCOMYCIN HCL 750 MG/150ML IV SOLN: 750.0000 mg | INTRAVENOUS | Status: DC

## 2022-11-02 MED ORDER — PROPOFOL 10 MG/ML IV BOLUS
INTRAVENOUS | Status: AC
  Filled 2022-11-02: qty 20

## 2022-11-02 MED ORDER — BUPIVACAINE HCL (PF) 0.5 % IJ SOLN
INTRAMUSCULAR | Status: AC
  Filled 2022-11-02: qty 30

## 2022-11-02 MED ORDER — ATENOLOL 50 MG PO TABS
50.0000 mg | ORAL_TABLET | Freq: Every day | ORAL | Status: DC
  Administered 2022-11-02 – 2022-11-09 (×6): 50 mg via ORAL
  Filled 2022-11-02: qty 1
  Filled 2022-11-02: qty 2
  Filled 2022-11-02 (×6): qty 1

## 2022-11-02 MED ORDER — ACETAMINOPHEN 650 MG RE SUPP
650.0000 mg | Freq: Four times a day (QID) | RECTAL | Status: DC | PRN
  Administered 2022-11-02: 650 mg via RECTAL
  Filled 2022-11-02: qty 2

## 2022-11-02 MED ORDER — ATORVASTATIN CALCIUM 20 MG PO TABS
40.0000 mg | ORAL_TABLET | Freq: Every day | ORAL | Status: DC
  Administered 2022-11-02 – 2022-11-09 (×6): 40 mg via ORAL
  Filled 2022-11-02 (×6): qty 2

## 2022-11-02 SURGICAL SUPPLY — 58 items
BLADE OSC/SAGITTAL MD 5.5X18 (BLADE) IMPLANT
BLADE OSC/SAGITTAL MD 9X18.5 (BLADE) IMPLANT
BLADE OSCILLATING/SAGITTAL (BLADE)
BLADE SW THK.38XMED LNG THN (BLADE) IMPLANT
BNDG COHESIVE 4X5 TAN STRL LF (GAUZE/BANDAGES/DRESSINGS) ×1 IMPLANT
BNDG ESMARK 4X12 TAN STRL LF (GAUZE/BANDAGES/DRESSINGS) ×1 IMPLANT
BNDG GAUZE DERMACEA FLUFF 4 (GAUZE/BANDAGES/DRESSINGS) ×1 IMPLANT
BNDG STRETCH GAUZE 3IN X12FT (GAUZE/BANDAGES/DRESSINGS) ×1 IMPLANT
CUFF TOURN SGL QUICK 12 (TOURNIQUET CUFF) IMPLANT
CUFF TOURN SGL QUICK 18X4 (TOURNIQUET CUFF) IMPLANT
DRAPE FLUOR MINI C-ARM 54X84 (DRAPES) IMPLANT
DRAPE XRAY CASSETTE 23X24 (DRAPES) IMPLANT
DRSG MEPILEX FLEX 3X3 (GAUZE/BANDAGES/DRESSINGS) IMPLANT
DURAPREP 26ML APPLICATOR (WOUND CARE) ×1 IMPLANT
ELECT REM PT RETURN 9FT ADLT (ELECTROSURGICAL) ×1
ELECTRODE REM PT RTRN 9FT ADLT (ELECTROSURGICAL) ×1 IMPLANT
GAUZE PACKING 0.25INX5YD STRL (GAUZE/BANDAGES/DRESSINGS) IMPLANT
GAUZE SPONGE 4X4 12PLY STRL (GAUZE/BANDAGES/DRESSINGS) ×1 IMPLANT
GAUZE STRETCH 2X75IN STRL (MISCELLANEOUS) ×1 IMPLANT
GAUZE XEROFORM 1X8 LF (GAUZE/BANDAGES/DRESSINGS) ×1 IMPLANT
GLOVE BIO SURGEON STRL SZ7.5 (GLOVE) ×1 IMPLANT
GLOVE SURG UNDER LTX SZ8 (GLOVE) ×1 IMPLANT
GOWN STRL REUS W/ TWL LRG LVL3 (GOWN DISPOSABLE) ×1 IMPLANT
GOWN STRL REUS W/ TWL XL LVL3 (GOWN DISPOSABLE) ×1 IMPLANT
GOWN STRL REUS W/TWL LRG LVL3 (GOWN DISPOSABLE) ×1
GOWN STRL REUS W/TWL MED LVL3 (GOWN DISPOSABLE) ×2 IMPLANT
GOWN STRL REUS W/TWL XL LVL3 (GOWN DISPOSABLE) ×1
HANDPIECE VERSAJET DEBRIDEMENT (MISCELLANEOUS) IMPLANT
IV NS IRRIG 3000ML ARTHROMATIC (IV SOLUTION) IMPLANT
KIT TURNOVER KIT A (KITS) ×1 IMPLANT
LABEL OR SOLS (LABEL) ×1 IMPLANT
MANIFOLD NEPTUNE II (INSTRUMENTS) ×1 IMPLANT
NDL FILTER BLUNT 18X1 1/2 (NEEDLE) ×1 IMPLANT
NDL HYPO 25X1 1.5 SAFETY (NEEDLE) ×1 IMPLANT
NEEDLE FILTER BLUNT 18X1 1/2 (NEEDLE) ×1 IMPLANT
NEEDLE HYPO 25X1 1.5 SAFETY (NEEDLE) ×1 IMPLANT
NS IRRIG 500ML POUR BTL (IV SOLUTION) ×1 IMPLANT
PACK EXTREMITY ARMC (MISCELLANEOUS) ×1 IMPLANT
PACKING GAUZE IODOFORM 1INX5YD (GAUZE/BANDAGES/DRESSINGS) IMPLANT
PAD ABD DERMACEA PRESS 5X9 (GAUZE/BANDAGES/DRESSINGS) ×1 IMPLANT
PULSAVAC PLUS IRRIG FAN TIP (DISPOSABLE)
RASP SM TEAR CROSS CUT (RASP) IMPLANT
SHIELD FULL FACE ANTIFOG 7M (MISCELLANEOUS) ×1 IMPLANT
SOL PREP PVP 2OZ (MISCELLANEOUS) ×1
SOLUTION PREP PVP 2OZ (MISCELLANEOUS) ×1 IMPLANT
STOCKINETTE IMPERVIOUS 9X36 MD (GAUZE/BANDAGES/DRESSINGS) ×1 IMPLANT
SUT ETHILON 2 0 FS 18 (SUTURE) ×2 IMPLANT
SUT ETHILON 4-0 (SUTURE)
SUT ETHILON 4-0 FS2 18XMFL BLK (SUTURE)
SUT VIC AB 3-0 SH 27 (SUTURE)
SUT VIC AB 3-0 SH 27X BRD (SUTURE) ×1 IMPLANT
SUT VIC AB 4-0 FS2 27 (SUTURE) ×1 IMPLANT
SUTURE ETHLN 4-0 FS2 18XMF BLK (SUTURE) ×1 IMPLANT
SWAB CULTURE AMIES ANAERIB BLU (MISCELLANEOUS) IMPLANT
SYR 10ML LL (SYRINGE) ×2 IMPLANT
TIP FAN IRRIG PULSAVAC PLUS (DISPOSABLE) IMPLANT
TRAP FLUID SMOKE EVACUATOR (MISCELLANEOUS) ×1 IMPLANT
WATER STERILE IRR 500ML POUR (IV SOLUTION) ×1 IMPLANT

## 2022-11-02 NOTE — Assessment & Plan Note (Signed)
Secondary to severe sepsis Will keep n.p.o. Fall and aspiration precautions

## 2022-11-02 NOTE — Transfer of Care (Signed)
Immediate Anesthesia Transfer of Care Note  Patient: [name]  Procedure(s) Performed: IRRIGATION AND DEBRIDEMENT RIGHT FOOT AND BONE BIOPSY RIGHT FOOT (Right: Foot)  Patient Location: PACU  Anesthesia Type:MAC  Level of Consciousness: drowsy  Airway & Oxygen Therapy: Patient Spontanous Breathing and Patient connected to face mask oxygen  Post-op Assessment: Report given to RN and Post -op Vital signs reviewed and stable  Post vital signs: Reviewed and stable  Last Vitals:  Vitals Value Taken Time  BP 92/52 11/02/22 1757  Temp    Pulse 66 11/02/22 1759  Resp 35 11/02/22 1759  SpO2 100 % 11/02/22 1759  Vitals shown include unvalidated device data.  Last Pain:  Vitals:   11/02/22 1450  TempSrc: Oral  PainSc:          Complications: No notable events documented.

## 2022-11-02 NOTE — ED Notes (Signed)
Pt in bed, pt has vomited some green liquid, changed bedding, cleaned pt, changed gown. Pt eyes open, pt is confused.  When asked pt's last name she responded with her birthday.  Re oriented pt.  Pt sinus rhythm on monitor, pt satting 88% on room air, placed pt on 4L via [place], pt satting mid 90s on 4L.

## 2022-11-02 NOTE — Progress Notes (Signed)
Progress Note   Patient: [name] WYO:[id number] DOB: 1941/06/06 DOA: 11/01/2022     1 DOS: the patient was seen and examined on 11/02/2022   Brief hospital course: Taken from H&P.   DIEGO DELANCEY is a 81 y.o. female with medical history significant for hypertension,diabetes mellitus, hypothyroidism, gout, breast cancer (s/p radiation and mastectomy), PVD, iron deficiency anemia, dCHF, hospitalized in October 2023 with bilateral lower extremity cellulitis, wheelchair-bound at baseline and with a chronic left heel ulcer, right toe ulcer and sacral decubitus, managed by wound care and podiatry, who presents to the ED with altered mental status.  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   With EMS O2 sat was 88% on room air requiring 3 L to maintain sats in the high 90s and she was only answering yes or no and following simple commands.  She was last seen by wound care on 12/20 and based on chart review had recently completed a course of antibiotics. ED course and data review: Tmax 100.4, pulse 106.  BP 149/91 and O2 sat in the high 90s on room air.  WBC 17,000 with lactic acid 4.9-5.  COVID flu and RSV negative.  Urinalysis unremarkable.  Hemoglobin at baseline at 9, potassium 3.1, creatinine normal at 0.85. EKG,  flutter at 103 with no acute ST-T wave changes. 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 2. Trace right pleural effusion 3. Small hiatal hernia. 4. Urinary bladder distended with urine with persistent  bilateral mild hydroureteronephrosis. This may reflect chronic changes of obstructive uropathy or reflux. Consider urologic consultation. 5. Foci within the urinary bladder lumen suggestive of recent instrumentation. Correlate with urinalysis for infection if clinically indicated. 6. Colonic diverticulosis with no acute diverticulitis. 7. Aortic Atherosclerosis (ICD10-I70.0) including coronary and mitral annular calcifications. Patient was started on vancomycin, cefepime and Flagyl for sepsis of unknown source as well as sepsis fluids.  Patient has many wounds which include bilateral feet and decubitus ulcers. The ED provider spoke with vascular, Dr. Franchot Gallo who advised on starting heparin infusion and will see in the AM.  12/22: Afebrile with mildly elevated blood pressure at 156/83, weaned off to room air.  CBC with some improvement of leukocytosis to 15.1, hemoglobin decreased to 8.3 and platelets of 514, all cell lines decreased, most most likely some dilutional effect.  CMP with potassium improved to 3.4, albumin at 2.6.  Procalcitonin 0.15.  Last recorded lactic acid of 5>>1.6 Preliminary blood cultures negative in 12 hours.  Urine cultures pending. Podiatry saw her and there was more concerning ulcer involving right heel with purulent discharge.  They were concerned that she might have osteo given the depth of wound, x-ray with adjacent subcortical lucency in the posterior calcaneus, suspicious for osteomyelitis.  MRI would be more sensitive.  Vascular surgery is also on board and patient is currently on heparin infusion.  Mentation remained altered as compared to baseline.     Assessment and Plan: * Severe sepsis ([place]) Source is uncertain but potentially include wounds on right heel  and left great toe as well as septic thrombophlebitis.  Patient received IV fluid and broad-spectrum antibiotics per sepsis protocol.  Lactic acidosis has been resolved. Podiatry ordered right heel imaging  which shows a concern of osteomyelitis. Most likely will need debridement and bone biopsy-will defer that decision to podiatry. Continue Vanco, cefepime and Flagyl for now Close hemodynamic monitoring Follow blood cultures   Acute deep vein thrombosis (DVT) of femoral vein of left lower extremity (HCC) Continue heparin infusion Vascular consult-they will see the patient later today. Will keep n.p.o. in case of procedure-another message sent to vascular and podiatry both  Acute metabolic encephalopathy Secondary to severe sepsis Will keep n.p.o. Fall and aspiration precautions  Lower limb ulcer, ankle, right, limited to breakdown of skin ([place]) Podiatry consult-right heel x-ray with concern of osteomyelitis.  Might need debridement and bone biopsy. Patient follows at wound care center last seen 12/20 and has home health at her residence  Chronic ulcer of great toe of left foot with fat layer exposed [facility]) Podiatry consult  Hypokalemia IV repletion as patient n.p.o.  Chronic diastolic CHF (congestive heart failure) (HCC) Clinically euvolemic to dry Blood pressure stable with systolic in the 478G and 956O Will continue atenolol, lisinopril but will hold Lasix in view of sepsis Repeat echocardiogram was ordered by admitting provider.  Acute on chronic urinary retention CT abdomen and pelvis showing possible chronic obstructive uropathy with bladder distention and mild hydronephrosis Patient follows with urology and was last seen on 12/6 when temporary Foley was DC'd Patient has been voiding well with p.o. awake in the ED so will hold off on Foley at this time  Chronic anemia Hemoglobin stable around 9  History of mastectomy, right History of breast cancer s/p radiation and mastectomy No acute issues  Hypertension Continue atenolol and lisinopril  Diabetes mellitus, type 2 (HCC) Sliding scale insulin coverage       Subjective: Patient was oriented to self and place  when seen today.  Having belly and leg pain.  Physical Exam: Vitals:   11/02/22 1000 11/02/22 1030 11/02/22 1100 11/02/22 1318  BP: (!) 147/110  (!) 154/86 (!) 147/73  Pulse: 86  88 85  Resp: (!) 21 (!) 26 (!) 34 (!) 26  Temp:    99.2 F (37.3 C)  TempSrc:    Oral  SpO2: 100%  100% 100%  Weight:      Height:       General.  Frail and malnourished elderly lady, in no acute distress. Pulmonary.  Lungs clear bilaterally, normal respiratory effort. CV.  Regular rate and rhythm, no JVD, rub or murmur. Abdomen.  Soft, nontender, nondistended, BS positive. CNS.  Alert and oriented x 2.  No focal neurologic deficit. Extremities.  No edema, no cyanosis, pulses intact and symmetrical. Psychiatry.  Judgment and insight appears impaired  Data Reviewed: Prior data reviewed  Family Communication: Discussed with son on phone.  Disposition: Status is: Inpatient Remains inpatient appropriate because: Severity of illness  Planned Discharge Destination:  To be determined  DVT prophylaxis.  Heparin infusion Time spent:  minutes  This record has been created using Systems analyst. Errors have been sought and corrected,but may not always be located. Such creation errors do not reflect on the standard of care.   Author: Lorella Nimrod, MD 11/02/2022 1:44 PM  For on call review [email or web address].

## 2022-11-02 NOTE — Progress Notes (Signed)
Pharmacy Antibiotic Note  [name] is a 81 y.o. female admitted on 11/01/2022 with sepsis.  Pharmacy has been consulted for Vancomycin, Cefepime dosing.  Plan: Cefepime 2 gm IV X 1 given in ED on 12/21 @ 2009.   Cefepime 2 gm IV Q12H ordered to start on 12/22 @ 0800  Vancomycin 1 gm IV X 1 given on 12/21 @ 2014. Vancomycin 750 mg IV Q48H ordered to start on 12/23 @ 2000.   AUC = 458 Vanc trough = 8.7   Height: '4\' 11"'$  (149.9 cm) Weight: 37.2 kg (82 lb) IBW/kg (Calculated) : 43.2  Temp (24hrs), Avg:99 F (37.2 C), Min:97.6 F (36.4 C), Max:100.4 F (38 C)  Recent Labs  Lab 11/01/22 1947 11/01/22 2209  WBC 17.3*  --   CREATININE 0.85  --   LATICACIDVEN 4.9* 5.0*    Estimated Creatinine Clearance: 31.5 mL/min (by C-G formula based on SCr of 0.85 mg/dL).    Allergies  Allergen Reactions   Other Anaphylaxis    Anesthisia has been a health issue for her in the past.    Sulfa Antibiotics Rash    Antimicrobials this admission:   >>    >>   Dose adjustments this admission:   Microbiology results:  BCx:   UCx:    Sputum:    MRSA PCR:   Thank you for allowing pharmacy to be a part of this patient's care.  Taaj Hurlbut D 11/02/2022 2:01 AM

## 2022-11-02 NOTE — ED Notes (Signed)
Pt appears to be comfortable and resting, can observe even RR that are unlabored, pt remains on cardiac monitoring devices, no changes noted, side rails up x2 for safety, NAD noted, plan of care ongoing, call light within reach, no further concerns as of present.

## 2022-11-02 NOTE — Consult Note (Signed)
After consultation with Dr. Soundra Pilon with concern for osteomyelitis on x-ray I will plan to bring her to the operating room for debridement of heel and left foot if needed as well as biopsy of bone to the right heel.  I discussed this case with the patient's son who was able to provide consent for the surgery.  Consent was given verbally.  I did discuss the risks and benefits associated with surgery including complications from anesthesia as well.  Orders will be placed at this time.  Patient has been n.p.o. today.

## 2022-11-02 NOTE — Assessment & Plan Note (Addendum)
CT abdomen and pelvis showing possible chronic obstructive uropathy with bladder distention and mild hydronephrosis Patient follows with urology and was last seen on 12/6 when temporary Foley was DC'd Patient has been voiding well with p.o. awake in the ED so will hold off on Foley at this time

## 2022-11-02 NOTE — Consult Note (Signed)
ORTHOPAEDIC CONSULTATION  REQUESTING PHYSICIAN: Lorella Nimrod, MD  Chief Complaint: Sepsis with ulceration  HPI: [name] is a 81 y.o. female who complains of altered mental status.  Patient with multiple areas of ulcerations including bilateral feet as well as sacral decubitus and hip ulcerations.  Has been seen in the wound care center for bilateral foot ulcerations.  Was actually seen by myself in the outpatient clinic yesterday with a subungual hematoma to her right second toe.  In comparison she definitely has altered mental status as she was fully alert and oriented in the office yesterday.  Past Medical History:  Diagnosis Date   Arthritis    Arthritis    Breast cancer ([place]) 1992   right breast with lumpectomy and rad tx   Cancer of right breast ([place]) 04/16/2013   right breast with mastectomy   Diabetes mellitus without complication ([place])    Gout    High cholesterol    Hyperlipidemia    Hypertension    Personal history of radiation therapy    Past Surgical History:  Procedure Laterality Date   ABDOMINAL HYSTERECTOMY     ANKLE FRACTURE SURGERY Right 2004   BREAST LUMPECTOMY Right 1992   positive   MASTECTOMY Right 2014   SHOULDER SURGERY Right 2010   Social History   Socioeconomic History   Marital status: Married    Spouse name: Not on file   Number of children: 2   Years of education: Not on file   Highest education level: Not on file  Occupational History   Not on file  Tobacco Use   Smoking status: Never    Passive exposure: Never   Smokeless tobacco: Never  Vaping Use   Vaping Use: Never used  Substance and Sexual Activity   Alcohol use: No    Alcohol/week: 0.0 standard drinks of alcohol   Drug use: No   Sexual activity: Not Currently  Other Topics Concern   Not on file  Social History Narrative   Not on file   Social Determinants of Health   Financial Resource Strain: Not on file  Food Insecurity: No Food Insecurity (11/02/2022)    Hunger Vital Sign    Worried About Running Out of Food in the Last Year: Never true    Ran Out of Food in the Last Year: Never true  Transportation Needs: No Transportation Needs (11/02/2022)   PRAPARE - Hydrologist (Medical): No    Lack of Transportation (Non-Medical): No  Physical Activity: Not on file  Stress: Not on file  Social Connections: Not on file   Family History  Problem Relation Age of Onset   Diabetes Father    Breast cancer Neg Hx    Allergies  Allergen Reactions   Other Anaphylaxis    Anesthisia has been a health issue for her in the past.    Sulfa Antibiotics Rash   Prior to Admission medications   Medication Sig Start Date End Date Taking? Authorizing Provider  acetaminophen (TYLENOL) 500 MG tablet Take 500-1,000 mg by mouth 4 (four) times daily as needed for mild pain or moderate pain.    [provider]  allopurinol (ZYLOPRIM) 300 MG tablet Take 300 mg by mouth daily.    [provider]  atenolol (TENORMIN) 50 MG tablet Take 50 mg by mouth daily.     [provider]  atorvastatin (LIPITOR) 40 MG tablet Take 40 mg by mouth daily.     [provider]  diphenoxylate-atropine (LOMOTIL) 2.5-0.025 MG tablet Take 1-2 tablets by mouth 3 (three) times daily as needed. 08/20/22   [provider]  furosemide (LASIX) 40 MG tablet Take 40 mg by mouth daily.    [provider]  gentamicin cream (GARAMYCIN) 0.1 % Apply 1 Application topically in the morning and at bedtime.    [provider]  HYDROcodone-acetaminophen (NORCO) 7.5-325 MG tablet Take 1 tablet by mouth 2 (two) times daily as needed for moderate pain or severe pain.    [provider]  levothyroxine (SYNTHROID) 88 MCG tablet Take 88 mcg by mouth daily.    [provider]  lisinopril (ZESTRIL) 5 MG tablet Take 5 mg by mouth daily. 09/24/22 09/24/23  [provider]  metFORMIN (GLUCOPHAGE) 1000 MG  tablet Take 1,000 mg by mouth in the morning and at bedtime.    [provider]  sitaGLIPtin (JANUVIA) 50 MG tablet Take 50 mg by mouth daily.    [provider]  vitamin B-12 (CYANOCOBALAMIN) 1000 MCG tablet Take 1,000 mcg by mouth daily.    [provider]   CT ABDOMEN PELVIS W CONTRAST  Result Date: 11/01/2022 CLINICAL DATA:  abd pain, hx of diverticulitis EXAM: CT ABDOMEN AND PELVIS WITH CONTRAST TECHNIQUE: Multidetector CT imaging of the abdomen and pelvis was performed using the standard protocol following bolus administration of intravenous contrast. RADIATION DOSE REDUCTION: This exam was performed according to the departmental dose-optimization program which includes automated exposure control, adjustment of the mA and/or kV according to patient size and/or use of iterative reconstruction technique. CONTRAST:  78m OMNIPAQUE IOHEXOL 300 MG/ML  SOLN COMPARISON:  CT abdomen pelvis 09/26/2018, PET CT 12/23/2012, CT abdomen pelvis 08/22/2022 FINDINGS: Lower chest: Trace right pleural effusion. Coronary artery and mitral annular calcification. Small hiatal hernia. Hepatobiliary: No focal liver abnormality. Status post cholecystectomy. No biliary dilatation. Pancreas: No focal lesion. Normal pancreatic contour. No surrounding inflammatory changes. No main pancreatic ductal dilatation. Spleen: Normal in size without focal abnormality. Adrenals/Urinary Tract: No adrenal nodule bilaterally. Bilateral kidneys enhance symmetrically. Persistent bilateral mild hydronephrosis and hydroureter. Extrarenal pelvises bilaterally. The urinary bladder is distended with urine. Foci of gas within the urinary bladder lumen. Stomach/Bowel: Stomach is within normal limits. No evidence of bowel wall thickening or dilatation. Colonic diverticulosis. Appendix appears normal. Vascular/Lymphatic: The inferior vena cava is patent. Main portal vein, splenic, superior mesenteric veins are patent. Left  external iliac and femoral vein vein thrombus (2:70). No abdominal aorta or iliac aneurysm. Severe atherosclerotic plaque of the aorta and its branches. No abdominal, pelvic, or inguinal lymphadenopathy. Reproductive: Status post hysterectomy. No adnexal masses. Other: No intraperitoneal free fluid. No intraperitoneal free gas. No organized fluid collection. Musculoskeletal: No abdominal wall hernia or abnormality. Diffusely decreased bone density. No suspicious lytic or blastic osseous lesions. No acute displaced fracture. Multilevel severe degenerative changes of the spine. Mild retrolisthesis of L1 on L2, L2 on L3, L3 on L4. Grade 1 anterolisthesis L4 on L5. IMPRESSION: 1. 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 2. Trace right pleural effusion 3. Small hiatal hernia. 4. Urinary bladder distended with urine with persistent bilateral mild hydroureteronephrosis. This may reflect chronic changes of obstructive uropathy or reflux. Consider urologic consultation. 5. Foci within the urinary bladder lumen suggestive of recent instrumentation. Correlate with urinalysis for infection if clinically indicated. 6. Colonic diverticulosis with no acute diverticulitis. 7. Aortic Atherosclerosis (ICD10-I70.0) including coronary and mitral annular calcifications. Electronically Signed   By: MClelia CroftD.  On: 11/01/2022 22:46   DG Chest Port 1 View  Result Date: 11/01/2022 CLINICAL DATA:  Fever EXAM: PORTABLE CHEST 1 VIEW COMPARISON:  Chest radiograph dated 08/22/2022 FINDINGS: Patient is rotated to the right. Normal lung volumes. No focal consolidations. No pleural effusion or pneumothorax. The heart size and mediastinal contours are within normal limits. Right humeral anchor. IMPRESSION: Allowing for patient rotation, no evidence of acute cardiopulmonary disease. Electronically Signed   By: Darrin Nipper M.D.   On: 11/01/2022 20:38    Positive ROS: All other systems have been reviewed  and were otherwise negative with the exception of those mentioned in the HPI and as above.  12 point ROS was performed.  Physical Exam: General: Alert and oriented.  No apparent distress.  Vascular:  Left foot:Dorsalis Pedis:  absent Posterior Tibial:  absent  Right foot: Dorsalis Pedis:  absent Posterior Tibial:  absent  Neuro:absent  Derm: On her left great toe joint area there is a small 1 and half centimeter superficial dry ulceration.  No active drainage.  On the posterior aspect of her right heel is a large posterior heel decubitus ulceration.  Purulent drainage and mixed fibrotic tissue are noted.  See clinical picture.   Ortho/MS: Today she is in a contracted position.  Complaining of pain throughout the lower extremities.  Assessment: Large posterior heel ulceration Altered mental status Sepsis Peripheral vascular disease  Plan: Will order an x-ray to initially evaluate this right heel.  There is concern for deep wound infection and possible osteomyelitis given the depth of the wound.  Surrounding erythema and mixed fibrotic purulent drainage is noted.  Will start with local wound care for now.  May need MRI in the near future.  Vascular surgery has been consulted as well.  Patient appears critically ill at this time.  Will hold on surgical debridement until patient is stable.    Elesa Hacker, DPM Cell [phone]   11/02/2022 12:48 PM

## 2022-11-02 NOTE — Assessment & Plan Note (Signed)
History of breast cancer s/p radiation and mastectomy No acute issues

## 2022-11-02 NOTE — Progress Notes (Signed)
[name], [name] ([id number]) [id number] Page 1 of 15 Visit Report for 10/31/2022 Arrival Information Details Patient Name: Date of Service: [facility], [name] 10/31/2022 3:00 PM Medical Record Number: [id number] Patient Account Number: [id number] Date of Birth/Sex: Treating RN: 1941-02-07 (81 y.o. [name] Primary Care Peace Noyes: Tracie Harrier Other Clinician: Massie Kluver Referring Terius Jacuinde: Treating Rylee Huestis/Extender: Arn Medal Weeks in Treatment: 5 Visit Information History Since Last Visit All ordered tests and consults were completed: No Patient Arrived: Wheel Chair Added or deleted any medications: No Arrival Time: 15:46 Any new allergies or adverse reactions: No Transfer Assistance: EasyPivot Patient Lift Had a fall or experienced change in No Patient Identification Verified: Yes activities of daily living that may affect Secondary Verification Process Completed: Yes risk of falls: Patient Requires Transmission-Based Precautions: No Signs or symptoms of abuse/neglect since last visito No Patient Has Alerts: Yes Hospitalized since last visit: No Patient Alerts: DM II Implantable device outside of the clinic excluding No ABI R 1.30 TBI 1.12 cellular tissue based products placed in the center ABI L 1.27 TBI 1.21 since last visit: Has Dressing in Place as Prescribed: Yes Pain Present Now: Yes Electronic Signature(s) Signed: 11/02/2022 9:48:27 AM By: Massie Kluver Entered By: Massie Kluver on 10/31/2022 15:50:00 -------------------------------------------------------------------------------- Clinic Level of Care Assessment Details Patient Name: Date of Service: [facility], [name] 10/31/2022 3:00 PM Medical Record Number: [id number] Patient Account Number: [id number] Date of Birth/Sex: Treating RN: 12-Feb-1941 (81 y.o. [name] Primary Care [name]: Tracie Harrier Other Clinician: Massie Kluver Referring Sinda Leedom: Treating Labella Zahradnik/Extender: Arn Medal Weeks in Treatment: 5 Clinic Level of Care Assessment Items TOOL 4 Quantity Score _0  - 0 Use when only an EandM is performed on FOLLOW-UP visit ASSESSMENTS - Nursing Assessment / Reassessment X- 1 10 Reassessment of Co-morbidities (includes updates in patient status) X- 1 5 Reassessment of Adherence to Treatment Plan SHTERNA, LARAMEE L ([id number]) [id number] Page 2 of 15 ASSESSMENTS - Wound and Skin A ssessment / Reassessment _1  - 0 Simple Wound Assessment / Reassessment - one wound X- 4 5 Complex Wound Assessment / Reassessment - multiple wounds _2  - 0 Dermatologic / Skin Assessment (not related to wound area) ASSESSMENTS - Focused Assessment _3  - 0 Circumferential Edema Measurements - multi extremities _4  - 0 Nutritional Assessment / Counseling / Intervention _5  - 0 Lower Extremity Assessment (monofilament, tuning fork, pulses) _6  - 0 Peripheral Arterial Disease Assessment (using hand held doppler) ASSESSMENTS - Ostomy and/or Continence Assessment and Care _7  - 0 Incontinence Assessment and Management _8  - 0 Ostomy Care Assessment and Management (repouching, etc.) PROCESS - Coordination of Care X - Simple Patient / Family Education for ongoing care 1 15 _9  - 0 Complex (extensive) Patient / Family Education for ongoing care X- 1 10 Staff obtains Programmer, systems, Records, T Results / Process Orders est _10  - 0 Staff telephones HHA, Nursing Homes / Clarify orders / etc _11  - 0 Routine Transfer to another Facility (non-emergent condition) _12  - 0 Routine Hospital Admission (non-emergent condition) _13  - 0 New Admissions / Biomedical engineer / Ordering NPWT Apligraf, etc. , _14  - 0 Emergency Hospital Admission (emergent condition) X- 1 10 Simple Discharge Coordination _15  - 0 Complex (extensive) Discharge Coordination PROCESS - Special Needs _16  -  0 Pediatric / Minor Patient Management _17  - 0 Isolation Patient Management _18  - 0 Hearing / Language / Visual special needs _19  - 0 Assessment of Community assistance (transportation, D/C planning, etc.) _20  - 0 Additional  assistance / Altered mentation _0  - 0 Support Surface(s) Assessment (bed, cushion, seat, etc.) INTERVENTIONS - Wound Cleansing / Measurement _1  - 0 Simple Wound Cleansing - one wound X- 4 5 Complex Wound Cleansing - multiple wounds X- 1 5 Wound Imaging (photographs - any number of wounds) _2  - 0 Wound Tracing (instead of photographs) _3  - 0 Simple Wound Measurement - one wound X- 4 5 Complex Wound Measurement - multiple wounds INTERVENTIONS - Wound Dressings _4  - 0 Small Wound Dressing one or multiple wounds X- 2 15 Medium Wound Dressing one or multiple wounds X- 2 20 Large Wound Dressing one or multiple wounds <TXMIWOEHOZYYQMGN>_0<\/IBBCWUGQBVQXIHWT>_8  - 0 Application of Medications - topical <UEKCMKLKJZPHXTAV>_6<\/PVXYIAXKPVVZSMOL>_0  - 0 Application of Medications - injection INTERVENTIONS - Miscellaneous _7  - 0 External ear exam Pratte, Jenilee L ([id number]) [id number] Page 3 of 15 _8  - 0 Specimen Collection (cultures, biopsies, blood, body fluids, etc.) _9  - 0 Specimen(s) / Culture(s) sent or taken to Lab for analysis _10  - 0 Patient Transfer (multiple staff / Harrel Lemon Lift / Similar devices) _11  - 0 Simple Staple / Suture removal (25 or less) _12  - 0 Complex Staple / Suture removal (26 or more) _13  - 0 Hypo / Hyperglycemic Management (close monitor of Blood Glucose) _14  - 0 Ankle / Brachial Index (ABI) - do not check if billed separately X- 1 5 Vital Signs Has the patient been seen at the hospital within the last three years: Yes Total Score: 190 Level Of Care: New/Established - Level 5 Electronic Signature(s) Signed: 11/06/2022 5:01:49 PM By: Gretta Cool, BSN, RN, CWS, Kim RN, BSN Entered By: Gretta Cool, BSN, RN, CWS, Kim on 11/06/2022  07:14:22 -------------------------------------------------------------------------------- Encounter Discharge Information Details Patient Name: Date of Service: [facility], [name] 10/31/2022 3:00 PM Medical Record Number: [id number] Patient Account Number: [id number] Date of Birth/Sex: Treating RN: 1941/10/11 (81 y.o. [name] Primary Care Omega Durante: Tracie Harrier Other Clinician: Massie Kluver Referring Tylia Ewell: Treating Areya Lemmerman/Extender: Arn Medal Weeks in Treatment: 5 Encounter Discharge Information Items Discharge Condition: Stable Ambulatory Status: Wheelchair Discharge Destination: Home Transportation: Private Auto Accompanied By: son Schedule Follow-up Appointment: Yes Clinical Summary of Care: Electronic Signature(s) Signed: 11/02/2022 9:48:27 AM By: Massie Kluver Entered By: Massie Kluver on 10/31/2022 17:30:09 Lower Extremity Assessment Details -------------------------------------------------------------------------------- [name] ([id number]) [id number] Page 4 of 15 Patient Name: Date of Service: [facility], [name] 10/31/2022 3:00 PM Medical Record Number: [id number] Patient Account Number: [id number] Date of Birth/Sex: Treating RN: 04-01-1941 (81 y.o. [name] Primary Care Jaedin Regina: Tracie Harrier Other Clinician: Massie Kluver Referring Aviel Davalos: Treating Loye Reininger/Extender: Arn Medal Weeks in Treatment: 5 Edema Assessment Left: Right: Assessed: Yes Yes Edema: Yes Yes Calf Left: Right: Point of Measurement: 27 cm From Medial Instep 30.5 cm 35.2 cm Ankle Left: Right: Point of Measurement: 9 cm From Medial Instep 22.5 cm 22 cm Vascular Assessment Left: Right: Pulses: Dorsalis Pedis Palpable: Yes Yes Electronic Signature(s) Signed: 11/01/2022 3:46:10 PM By: Gretta Cool, BSN, RN, CWS, Kim RN, BSN Signed: 11/02/2022 9:48:27 AM By: Massie Kluver Entered By: Massie Kluver on 10/31/2022 16:19:21 -------------------------------------------------------------------------------- Multi Wound Chart Details Patient Name: Date of Service: [facility], [name] 10/31/2022 3:00 PM Medical Record Number: [id number] Patient Account Number: [id number] Date of Birth/Sex: Treating RN: 09-03-1941 (81 y.o. [name] Primary Care Clydie Dillen: Tracie Harrier Other Clinician: Massie Kluver Referring Nyellie Yetter: Treating Dawanna Grauberger/Extender: Arn Medal Weeks in Treatment: 5 Vital Signs Height(in): Pulse(bpm): 99 Weight(lbs): 116 Blood Pressure(mmHg): 144/82 Body Mass  Index(BMI): Temperature(F): 97.8 Respiratory Rate(breaths/min): 16 [10:Photos: No Photos Left Metatarsal head first Wound Location: Pressure Injury Wounding Event: Pressure Ulcer Primary Etiology: Cataracts, Lymphedema, Comorbid History: Hypertension, Peripheral Arterial Disease, Peripheral Venous Disease, Type II Diabetes,  Osteoarthritis, Neuropathy 10/23/2022 Date Acquired: 1 Weeks of Treatment:] [11:No Photos Right Ischial Tuberosity Pressure Injury Pressure Ulcer Cataracts, Lymphedema, Hypertension, Peripheral Arterial Disease, Peripheral Venous Disease, Type II  Diabetes, Osteoarthritis, Neuropathy 10/24/2022 0] [6:No Photos Right Calcaneus Pressure Injury Pressure Ulcer Cataracts, Lymphedema, Hypertension, Peripheral Arterial Disease, Peripheral Venous Disease, Type II Diabetes, Osteoarthritis, Neuropathy  07/23/2022 5] HINATA, DIENER ([id number]) [10:Open Wound Status: No Wound Recurrence: 1.5x1.5x0.1 Measurements L x W x D (cm) 1.767 A (cm) : rea 0.177 Volume (cm) : 55.00% % Reduction in A rea: 77.50% % Reduction in Volume: N/A Undermining: Category/Stage IV Classification: Large Exudate A  mount: Serosanguineous Exudate Type: red, brown Exudate Color: No Foul Odor A Cleansing: fter N/A Odor A nticipated Due to Product Use: Flat and Intact  Wound Margin: None Present (0%) Granulation A mount: Medium (34-66%) Necrotic A mount: Necrosis of  Bone Necrotic Tissue: Bone: Yes Exposed Structures: Fascia: No Fat Layer (Subcutaneous Tissue): No Tendon: No Muscle: No Joint: No None Epithelialization:] [11:Open No 1.2x2.2x0.1 2.073 0.207 N/A N/A No Unstageable/Unclassified None Present N/A N/A N/A  N/A Flat and Intact N/A N/A Eschar Fascia: No Fat Layer (Subcutaneous Tissue): No Tendon: No Muscle: No Joint: No Bone: No None] [id number] Page 5 of 15 Open No 3.6x3x0.1 8.482 0.848 10.00% 70.00% N/A  Unstageable/Unclassified Medium Serosanguineous red, brown Yes No N/A None Present (0%) Large (67-100%) Eschar Fascia: No Fat Layer (Subcutaneous Tissue): No Tendon: No Muscle: No Joint: No Bone: No None] Wound Number: 8 N/A N/A Photos: No Photos N/A N/A Left, Distal Gluteus N/A N/A Wound Location: Pressure Injury N/A N/A Wounding Event: Pressure Ulcer N/A N/A Primary Etiology: Cataracts, Lymphedema, N/A N/A Comorbid History: Hypertension, Peripheral Arterial Disease, Peripheral Venous Disease, Type II Diabetes, Osteoarthritis, Neuropathy 07/23/2022 N/A N/A Date Acquired: 5 N/A N/A Weeks of Treatment: Open N/A N/A Wound Status: No N/A N/A Wound Recurrence: 2x2.4x1.3 N/A N/A Measurements L x W x D (cm) 3.77 N/A N/A A (cm) : rea 4.901 N/A N/A Volume (cm) : 31.40% N/A N/A % Reduction in A rea: -197.20% N/A N/A % Reduction in Volume: 9 Starting Position 1 (o'clock): 5 Ending Position 1 (o'clock): 1.9 Maximum Distance 1 (cm): Yes N/A N/A Undermining: Unstageable/Unclassified N/A N/A Classification: Medium N/A N/A Exudate A mount: Serosanguineous N/A N/A Exudate Type: red, brown N/A N/A Exudate Color: Yes N/A N/A Foul Odor A Cleansing: fter No N/A N/A Odor A nticipated Due to Product Use: N/A N/A N/A Wound Margin: None Present (0%) N/A N/A Granulation A mount: Large (67-100%) N/A  N/A Necrotic A mount: Adherent Slough N/A N/A Necrotic Tissue: Fascia: No N/A N/A Exposed Structures: Fat Layer (Subcutaneous Tissue): No Tendon: No Muscle: No Joint: No Bone: No None N/A N/A Epithelialization: Treatment Notes Electronic Signature(s) Signed: 11/02/2022 9:48:27 AM By: Massie Kluver Entered By: Massie Kluver on 10/31/2022 16:19:33 Musquiz, [name] ([id number]) [id number] Page 6 of 15 -------------------------------------------------------------------------------- Multi-Disciplinary Care Plan Details Patient Name: Date of Service: [facility], [name] 10/31/2022 3:00 PM Medical Record Number: [id number] Patient Account Number: [id number] Date of Birth/Sex: Treating RN: 1940-12-08 (81 y.o. [name] Primary Care Meryl Hubers: Tracie Harrier Other Clinician: Massie Kluver Referring Roman Sandall: Treating Athens Lebeau/Extender: Arn Medal Weeks in Treatment: 5 Active Inactive Necrotic Tissue Nursing Diagnoses: Impaired tissue integrity related  to necrotic/devitalized tissue Knowledge deficit related to management of necrotic/devitalized tissue Goals: Necrotic/devitalized tissue will be minimized in the wound bed Date Initiated: 10/31/2022 Target Resolution Date: 11/07/2022 Goal Status: Active Patient/caregiver will verbalize understanding of reason and process for debridement of necrotic tissue Date Initiated: 10/31/2022 Target Resolution Date: 10/31/2022 Goal Status: Active Interventions: Assess patient pain level pre-, during and post procedure and prior to discharge Provide education on necrotic tissue and debridement process Treatment Activities: Apply topical anesthetic as ordered : 10/31/2022 Biologic debridement : 10/31/2022 Notes: Osteomyelitis Nursing Diagnoses: Potential for infection: osteomyelitis Goals: Patient/caregiver will verbalize understanding of disease process and disease  management Date Initiated: 10/31/2022 Target Resolution Date: 10/31/2022 Goal Status: Active Signs and symptoms for osteomyelitis will be recognized and promptly addressed Date Initiated: 10/31/2022 Target Resolution Date: 10/31/2022 Goal Status: Active Interventions: Assess for signs and symptoms of osteomyelitis resolution every visit Treatment Activities: Systemic antibiotics : 10/31/2022 Notes: Soft Tissue Infection Nursing Diagnoses: Impaired tissue integrity Potential for infection: soft tissue Goals: Patient/caregiver will verbalize understanding of or measures to prevent infection and contamination in the home setting KANITRA, PURIFOY L ([id number]) [id number].pdf Page 7 of 15 Date Initiated: 10/31/2022 Target Resolution Date: 10/31/2022 Goal Status: Active Signs and symptoms of infection will be recognized early to allow for prompt treatment Date Initiated: 10/31/2022 Target Resolution Date: 10/31/2022 Goal Status: Active Interventions: Assess signs and symptoms of infection every visit Notes: Wound/Skin Impairment Nursing Diagnoses: Impaired tissue integrity Goals: Patient/caregiver will verbalize understanding of skin care regimen Date Initiated: 10/31/2022 Date Inactivated: 11/06/2022 Target Resolution Date: 10/31/2022 Goal Status: Met Ulcer/skin breakdown will have a volume reduction of 30% by week 4 Date Initiated: 10/31/2022 Date Inactivated: 11/06/2022 Target Resolution Date: 10/31/2022 Unmet Reason: comorbidities, failure to Goal Status: Unmet thrive Ulcer/skin breakdown will have a volume reduction of 50% by week 8 Date Initiated: 11/06/2022 Target Resolution Date: 11/28/2022 Goal Status: Active Interventions: Assess patient/caregiver ability to perform ulcer/skin care regimen upon admission and as needed Assess ulceration(s) every visit Provide education on ulcer and skin care Treatment Activities: Skin care regimen  initiated : 10/31/2022 Topical wound management initiated : 10/31/2022 Notes: Electronic Signature(s) Signed: 11/06/2022 7:22:49 AM By: Gretta Cool, BSN, RN, CWS, Kim RN, BSN Entered By: Gretta Cool, BSN, RN, CWS, Kim on 11/06/2022 07:22:49 -------------------------------------------------------------------------------- Pain Assessment Details Patient Name: Date of Service: [facility], [name] 10/31/2022 3:00 PM Medical Record Number: [id number] Patient Account Number: [id number] Date of Birth/Sex: Treating RN: 1941-05-05 (81 y.o. [name] Primary Care Rushawn Capshaw: Tracie Harrier Other Clinician: Massie Kluver Referring Kawena Lyday: Treating Mikeal Winstanley/Extender: Arn Medal Weeks in Treatment: 5 Active Problems Location of Pain Severity and Description of Pain Patient Has Paino Yes Site Locations Pain LocationFINOLA, ROSAL ([id number]) [phone].pdf Page 8 of 15 Pain Location: Pain in Ulcers Duration of the Pain. Constant / Intermittento Constant Rate the pain. Current Pain Level: 7 Character of Pain Describe the Pain: Aching Pain Management and Medication Current Pain Management: Medication: No Cold Application: No Rest: No Massage: No Activity: No T.E.N.S.: No Heat Application: No Leg drop or elevation: No Is the Current Pain Management Adequate: Inadequate How does your wound impact your activities of daily livingo Sleep: No Bathing: No Appetite: No Relationship With Others: No Bladder Continence: No Emotions: No Bowel Continence: No Work: No Toileting: No Drive: No Dressing: No Hobbies: No Engineer, maintenance) Signed: 11/01/2022 3:46:10 PM By: Gretta Cool, BSN, RN, CWS, Kim RN, BSN Signed: 11/02/2022 9:48:27 AM By: Massie Kluver Entered By: Massie Kluver on 10/31/2022 16:11:50 --------------------------------------------------------------------------------  Patient/Caregiver Education Details Patient Name:  Date of Service: [facility], [name] 12/20/2023andnbsp3:00 PM Medical Record Number: [id number] Patient Account Number: [id number] Date of Birth/Gender: Treating RN: January 24, 1941 (81 y.o. [name] Primary Care Physician: Tracie Harrier Other Clinician: Massie Kluver Referring Physician: Treating Physician/Extender: Arn Medal Weeks in Treatment: 5 Education Assessment Education Provided To: Patient Education Topics Provided Offloading: Handouts: How [place] Sao, Ahaana L ([id number]) [phone].pdf Page 9 of 15 Methods: Demonstration, Explain/Verbal Responses: See progress note, State content correctly Pressure: Handouts: Pressure Injury: Prevention and Offloading Methods: Explain/Verbal Responses: See progress note, State content correctly Electronic Signature(s) Signed: 11/06/2022 5:01:49 PM By: Gretta Cool, BSN, RN, CWS, Kim RN, BSN Entered By: Gretta Cool, BSN, RN, CWS, Kim on 11/06/2022 07:15:09 -------------------------------------------------------------------------------- Wound Assessment Details Patient Name: Date of Service: [facility], [name] 10/31/2022 3:00 PM Medical Record Number: [id number] Patient Account Number: [id number] Date of Birth/Sex: Treating RN: 05-21-1941 (81 y.o. Charolette Forward, Kim Primary Care Foday Cone: Tracie Harrier Other Clinician: Massie Kluver Referring Fotini Lemus: Treating Bunny Kleist/Extender: Arn Medal Weeks in Treatment: 5 Wound Status Wound Number: 10 Primary Pressure Ulcer Etiology: Wound Location: Left Metatarsal head first Wound Open Wounding Event: Pressure Injury Status: Date Acquired: 10/23/2022 Comorbid Cataracts, Lymphedema, Hypertension, Peripheral Arterial Disease, Weeks Of Treatment: 1 History: Peripheral Venous Disease, Type II Diabetes, Osteoarthritis, Clustered Wound: No Neuropathy Photos Wound Measurements Length:  (cm) 1.5 Width: (cm) 1.5 Depth: (cm) 0.1 Area: (cm) 1.767 Volume: (cm) 0.177 % Reduction in Area: 55% % Reduction in Volume: 77.5% Epithelialization: None Wound Description Classification: Category/Stage IV Wound Margin: Flat and Intact Exudate Amount: Large Exudate Type: Serosanguineous Exudate Color: red, brown Foul Odor After Cleansing: No Slough/Fibrino No Wound Bed Rimel, Alizae L ([id number]) [id number] Page 10 of 15 Granulation Amount: None Present (0%) Exposed Structure Necrotic Amount: Medium (34-66%) Fascia Exposed: No Necrotic Quality: Bone Fat Layer (Subcutaneous Tissue) Exposed: No Tendon Exposed: No Muscle Exposed: No Joint Exposed: No Bone Exposed: Yes Treatment Notes Wound #10 (Metatarsal head first) Wound Laterality: Left Cleanser Dakin 16 (oz) 0.25 Discharge Instruction: Use as directed. Soap and Water Discharge Instruction: Gently cleanse wound with antibacterial soap, rinse and pat dry prior to dressing wounds Peri-Wound Care Topical Primary Dressing Gauze Discharge Instruction: moistened with Dakins Solution Secondary Dressing ABD Pad 5x9 (in/in) Discharge Instruction: Cover with ABD pad Secured With Medipore T - 13M Medipore H Soft Cloth Surgical T ape ape, 2x2 (in/yd) Compression Wrap Compression Stockings Add-Ons Electronic Signature(s) Signed: 11/01/2022 3:46:10 PM By: Gretta Cool, BSN, RN, CWS, Kim RN, BSN Signed: 11/02/2022 9:48:27 AM By: Massie Kluver Entered By: Massie Kluver on 10/31/2022 16:21:44 -------------------------------------------------------------------------------- Wound Assessment Details Patient Name: Date of Service: [facility], [name] 10/31/2022 3:00 PM Medical Record Number: [id number] Patient Account Number: [id number] Date of Birth/Sex: Treating RN: August 09, 1941 (81 y.o. [name] Primary Care Oluwatomisin Hustead: Tracie Harrier Other Clinician: Massie Kluver Referring  Letasha Kershaw: Treating Kristia Jupiter/Extender: Arn Medal Weeks in Treatment: 5 Wound Status Wound Number: 11 Primary Pressure Ulcer Etiology: Wound Location: Right Ischial Tuberosity Wound Open Wounding Event: Pressure Injury Status: Date Acquired: 10/24/2022 Comorbid Cataracts, Lymphedema, Hypertension, Peripheral Arterial Disease, Weeks Of Treatment: 0 History: Peripheral Venous Disease, Type II Diabetes, Osteoarthritis, Clustered Wound: No Neuropathy Photos Kolasinski, Ozella L ([id number]) [id number] Page 11 of 15 Wound Measurements Length: (cm) 1.2 Width: (cm) 2.2 Depth: (cm) 0.1 Area: (cm) 2.073 Volume: (cm) 0.207 % Reduction in Area: % Reduction in Volume: Epithelialization: None Tunneling: No Undermining: No Wound  Description Classification: Unstageable/Unclassified Wound Margin: Flat and Intact Exudate Amount: None Present Wound Bed Necrotic Amount: Medium (34-66%) Exposed Structure Necrotic Quality: Eschar Fascia Exposed: No Fat Layer (Subcutaneous Tissue) Exposed: No Tendon Exposed: No Muscle Exposed: No Joint Exposed: No Bone Exposed: No Treatment Notes Wound #11 (Ischial Tuberosity) Wound Laterality: Right Cleanser Dakin 16 (oz) 0.25 Discharge Instruction: Use as directed. Soap and Water Discharge Instruction: Gently cleanse wound with antibacterial soap, rinse and pat dry prior to dressing wounds Peri-Wound Care Topical Primary Dressing Honey: Activon Honey Gel, 25 (g) Tube Secondary Dressing Gauze Discharge Instruction: As directed: dry, moistened with saline or moistened with Dakins Solution (BORDER) Zetuvit Plus SILICONE BORDER Dressing 5x5 (in/in) Discharge Instruction: Please do not put silicone bordered dressings under wraps. Use non-bordered dressing only. Secured With [place] H Soft Cloth Surgical T ape ape, 2x2 (in/yd) Compression Wrap Compression  Stockings Environmental education officer) Signed: 11/01/2022 3:46:10 PM By: Gretta Cool, BSN, RN, CWS, Kim RN, BSN Signed: 11/02/2022 9:48:27 AM By: Massie Kluver Entered By: Massie Kluver on 10/31/2022 16:22:26 Mindel, [name] ([id number]) [id number] Page 12 of 15 -------------------------------------------------------------------------------- Wound Assessment Details Patient Name: Date of Service: [facility], [name] 10/31/2022 3:00 PM Medical Record Number: [id number] Patient Account Number: [id number] Date of Birth/Sex: Treating RN: 27-Feb-1941 (81 y.o. Charolette Forward, Kim Primary Care Kaimana Lurz: Tracie Harrier Other Clinician: Massie Kluver Referring Tennelle Taflinger: Treating Quandre Polinski/Extender: Arn Medal Weeks in Treatment: 5 Wound Status Wound Number: 6 Primary Pressure Ulcer Etiology: Wound Location: Right Calcaneus Wound Open Wounding Event: Pressure Injury Status: Date Acquired: 07/23/2022 Comorbid Cataracts, Lymphedema, Hypertension, Peripheral Arterial Disease, Weeks Of Treatment: 5 History: Peripheral Venous Disease, Type II Diabetes, Osteoarthritis, Clustered Wound: No Neuropathy Photos Wound Measurements Length: (cm) 3.6 Width: (cm) 3 Depth: (cm) 0.1 Area: (cm) 8.482 Volume: (cm) 0.848 % Reduction in Area: 10% % Reduction in Volume: 70% Epithelialization: None Wound Description Classification: Unstageable/Unclassified Exudate Amount: Medium Exudate Type: Serosanguineous Exudate Color: red, brown Foul Odor After Cleansing: Yes Due to Product Use: No Slough/Fibrino Yes Wound Bed Granulation Amount: None Present (0%) Exposed Structure Necrotic Amount: Large (67-100%) Fascia Exposed: No Necrotic Quality: Eschar Fat Layer (Subcutaneous Tissue) Exposed: No Tendon Exposed: No Muscle Exposed: No Joint Exposed: No Bone Exposed: No Treatment Notes Wound #6 (Calcaneus) Wound Laterality: Right Cleanser [place]  Ancillary Kit - 15 Day Supply Discharge Instruction: Use supplies as instructed; Kit contains: (15) Saline Bullets; (15) 3x3 Gauze; 15 pr Gloves Dakin 16 (oz) 0.25 Lisowski, Lola L ([id number]) [id number] Page 13 of 15 Discharge Instruction: Use as directed. Soap and Water Discharge Instruction: Gently cleanse wound with antibacterial soap, rinse and pat dry prior to dressing wounds Peri-Wound Care Topical Activon Honey Gel, 25 (g) Tube Primary Dressing Hydrofera Blue Ready Transfer Foam, 2.5x2.5 (in/in) Discharge Instruction: Apply Hydrofera Blue Ready to wound bed as directed Secondary Dressing ABD Pad 5x9 (in/in) Discharge Instruction: Cover with ABD pad Kerlix 4.5 x 4.1 (in/yd) Discharge Instruction: Apply Kerlix 4.5 x 4.1 (in/yd) as instructed Secured With Medipore T - 70M Medipore H Soft Cloth Surgical T ape ape, 2x2 (in/yd) Compression Wrap Compression Stockings Environmental education officer) Signed: 11/01/2022 3:46:10 PM By: Gretta Cool, BSN, RN, CWS, Kim RN, BSN Signed: 11/02/2022 9:48:27 AM By: Massie Kluver Entered By: Massie Kluver on 10/31/2022 16:22:46 -------------------------------------------------------------------------------- Wound Assessment Details Patient Name: Date of Service: [facility], [name] 10/31/2022 3:00 PM Medical Record Number: [id number] Patient Account Number: [id number] Date of Birth/Sex: Treating RN: [birth year] (81 y.o. Charolette Forward, Maudie Mercury  Primary Care Monya Kozakiewicz: Tracie Harrier Other Clinician: Massie Kluver Referring Emit Kuenzel: Treating Laylamarie Meuser/Extender: Arn Medal Weeks in Treatment: 5 Wound Status Wound Number: 8 Primary Pressure Ulcer Etiology: Wound Location: Left, Distal Gluteus Wound Open Wounding Event: Pressure Injury Status: Date Acquired: 07/23/2022 Comorbid Cataracts, Lymphedema, Hypertension, Peripheral Arterial Disease, Weeks Of Treatment: 5 History: Peripheral Venous  Disease, Type II Diabetes, Osteoarthritis, Clustered Wound: No Neuropathy Photos LADASIA, SIRCY L ([id number]) [id number] Page 14 of 15 Wound Measurements Length: (cm) 2 Width: (cm) 2.4 Depth: (cm) 1.3 Area: (cm) 3.77 Volume: (cm) 4.901 % Reduction in Area: 31.4% % Reduction in Volume: -197.2% Epithelialization: None Tunneling: No Undermining: Yes Starting Position (o'clock): 9 Ending Position (o'clock): 5 Maximum Distance: (cm) 1.9 Wound Description Classification: Unstageable/Unclassified Exudate Amount: Medium Exudate Type: Serosanguineous Exudate Color: red, brown Foul Odor After Cleansing: Yes Due to Product Use: No Slough/Fibrino Yes Wound Bed Granulation Amount: None Present (0%) Exposed Structure Necrotic Amount: Large (67-100%) Fascia Exposed: No Necrotic Quality: Adherent Slough Fat Layer (Subcutaneous Tissue) Exposed: No Tendon Exposed: No Muscle Exposed: No Joint Exposed: No Bone Exposed: No Treatment Notes Wound #8 (Gluteus) Wound Laterality: Left, Distal Cleanser Dakin 16 (oz) 0.25 Discharge Instruction: Use as directed. Soap and Water Discharge Instruction: Gently cleanse wound with antibacterial soap, rinse and pat dry prior to dressing wounds Peri-Wound Care Topical Primary Dressing Gauze Discharge Instruction: moistened with Dakins Solution Secondary Dressing ABD Pad 5x9 (in/in) Discharge Instruction: Cover with ABD pad Secured With Medipore T - 79M Medipore H Soft Cloth Surgical T ape ape, 2x2 (in/yd) Compression Wrap Compression Stockings Add-Ons Electronic Signature(s) Signed: 11/01/2022 3:46:10 PM By: Gretta Cool, BSN, RN, CWS, Kim RN, BSN Signed: 11/02/2022 9:48:27 AM By: Massie Kluver Entered By: Massie Kluver on 10/31/2022 16:23:07 [place], [name] ([id number]) [id number] Page 15 of 15 -------------------------------------------------------------------------------- Vitals  Details Patient Name: Date of Service: [facility], [name] 10/31/2022 3:00 PM Medical Record Number: [id number] Patient Account Number: [id number] Date of Birth/Sex: Treating RN: 01-10-1941 (81 y.o. Charolette Forward, Kim Primary Care Anyeli Hockenbury: Tracie Harrier Other Clinician: Massie Kluver Referring Keene Gilkey: Treating Zaedyn Covin/Extender: Arn Medal Weeks in Treatment: 5 Vital Signs Time Taken: 15:50 Temperature (F): 97.8 Weight (lbs): 116 Pulse (bpm): 99 Respiratory Rate (breaths/min): 16 Blood Pressure (mmHg): 144/82 Reference Range: 80 - 120 mg / dl Electronic Signature(s) Signed: 11/02/2022 9:48:27 AM By: Massie Kluver Entered By: Massie Kluver on 10/31/2022 16:11:45

## 2022-11-02 NOTE — Hospital Course (Addendum)
Taken from H&P.   [name] is a 81 y.o. female with medical history significant for hypertension,diabetes mellitus, hypothyroidism, gout, breast cancer (s/p radiation and mastectomy), PVD, iron deficiency anemia, dCHF, hospitalized in October 2023 with bilateral lower extremity cellulitis, wheelchair-bound at baseline and with a chronic left heel ulcer, right toe ulcer and sacral decubitus, managed by wound care and podiatry, who presents to the ED with altered mental status.  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   With EMS O2 sat was 88% on room air requiring 3 L to maintain sats in the high 90s and she was only answering yes or no and following simple commands.  She was last seen by wound care on 12/20 and based on chart review had recently completed a course of antibiotics. ED course and data review: Tmax 100.4, pulse 106.  BP 149/91 and O2 sat in the high 90s on room air.  WBC 17,000 with lactic acid 4.9-5.  COVID flu and RSV negative.  Urinalysis unremarkable.  Hemoglobin at baseline at 9, potassium 3.1, creatinine normal at 0.85. EKG,  flutter at 103 with no acute ST-T wave changes. 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 2. Trace right pleural effusion 3. Small hiatal hernia. 4. Urinary bladder distended with urine with persistent bilateral mild hydroureteronephrosis. This may reflect chronic changes of obstructive uropathy or reflux. Consider urologic consultation. 5. Foci within the urinary bladder lumen  suggestive of recent instrumentation. Correlate with urinalysis for infection if clinically indicated. 6. Colonic diverticulosis with no acute diverticulitis. 7. Aortic Atherosclerosis (ICD10-I70.0) including coronary and mitral annular calcifications. Patient was started on vancomycin, cefepime and Flagyl for sepsis of unknown source as well as sepsis fluids.  Patient has many wounds which include bilateral feet and decubitus ulcers. The ED provider spoke with vascular, Dr. Franchot Gallo who advised on starting heparin infusion and will see in the AM.  12/22: Afebrile with mildly elevated blood pressure at 156/83, weaned off to room air.  CBC with some improvement of leukocytosis to 15.1, hemoglobin decreased to 8.3 and platelets of 514, all cell lines decreased, most most likely some dilutional effect.  CMP with potassium improved to 3.4, albumin at 2.6.  Procalcitonin 0.15.  Last recorded lactic acid of 5>>1.6 Preliminary blood cultures negative in 12 hours.  Urine cultures pending. Podiatry saw her and there was more concerning ulcer involving right heel with purulent discharge.  They were concerned that she might have osteo given the depth of wound, x-ray with adjacent subcortical lucency in the posterior calcaneus, suspicious for osteomyelitis.  MRI would be more sensitive.  Vascular surgery is also on board and patient is currently on heparin infusion.  Mentation remained altered as compared to baseline.

## 2022-11-02 NOTE — Assessment & Plan Note (Signed)
Hemoglobin stable around 9

## 2022-11-02 NOTE — Progress Notes (Signed)
Message sent to Dr. Vickki Muff regarding continuation of heparin order.  Dr. Vickki Muff advises to continue heparin.

## 2022-11-02 NOTE — H&P (Signed)
History and Physical    Patient: [name] NFA:[id number] DOB: [birth year] DOA: 11/01/2022 DOS: the patient was seen and examined on 11/02/2022 PCP: Tracie Harrier, MD  Patient coming from: Home  Chief Complaint:  Chief Complaint  Patient presents with   Code Sepsis    HPI: [name] is a 81 y.o. female with medical history significant for hypertension,diabetes mellitus, hypothyroidism, gout, breast cancer (s/p radiation and mastectomy), PVD, iron deficiency anemia, dCHF, hospitalized in October 2023 with bilateral lower extremity cellulitis, wheelchair-bound at baseline and with a chronic left heel ulcer, right toe ulcer and sacral decubitus, managed by wound care and podiatry, who presents to the ED with altered mental status.  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  Son noted her to to be just staring and he states that this is not her baseline at all.  With EMS O2 sat was 88% on room air requiring 3 L to maintain sats in the high 90s and she was only answering yes or no and following simple commands.  She was last seen by wound care on 12/20 and based on chart review had recently completed a course of antibiotics. ED course and data review: Tmax 100.4, pulse 106.  BP 149/91 and O2 sat in the high 90s on room air.  WBC 17,000 with lactic acid 4.9-5.  COVID flu and RSV negative.  Urinalysis unremarkable.  Hemoglobin at baseline at 9, potassium 3.1, creatinine normal at 0.85. EKG, personally viewed and interpreted showing a flutter at 103 with no acute ST-T wave changes. CT abdomen and pelvis showing left external iliac and femoral vein thrombus and chronic obstructive uropathy with mild  hydro ureteral nephrosis IMPRESSION: 1. 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 2. Trace right pleural effusion 3. Small hiatal hernia. 4. Urinary bladder distended with urine with persistent bilateral mild hydroureteronephrosis. This may reflect chronic changes of obstructive uropathy or reflux. Consider urologic consultation. 5. Foci within the urinary bladder lumen suggestive of recent instrumentation. Correlate with urinalysis for infection if clinically indicated. 6. Colonic diverticulosis with no acute diverticulitis. 7. Aortic Atherosclerosis (ICD10-I70.0) including coronary and mitral annular calcifications. Patient was started on vancomycin, cefepime and Flagyl for sepsis of unknown source as well as sepsis fluids. The ED provider spoke with vascular, Dr. Franchot Gallo who advised on starting heparin infusion and will see in the AM. Hospitalist consulted for admission.     Past Medical History:  Diagnosis Date   Arthritis    Arthritis    Breast cancer ([place]) 1992   right breast with lumpectomy and rad tx   Cancer of right breast ([place]) 04/16/2013   right breast with mastectomy   Diabetes mellitus without complication ([place])    Gout    High cholesterol    Hyperlipidemia    Hypertension    Personal history of radiation therapy    Past Surgical History:  Procedure Laterality Date   ABDOMINAL HYSTERECTOMY     ANKLE FRACTURE SURGERY Right 2004   BREAST LUMPECTOMY Right 1992   positive   MASTECTOMY Right 2014   SHOULDER SURGERY Right 2010   Social History:  reports that she has never smoked.  She has never been exposed to tobacco smoke. She has never used smokeless tobacco. She reports that she does not drink alcohol and does not use drugs.  Allergies  Allergen Reactions   Other Anaphylaxis    Anesthisia has been a health issue for her in the past.    Sulfa Antibiotics Rash    Family History  Problem Relation Age of Onset   Diabetes  Father    Breast cancer Neg Hx     Prior to Admission medications   Medication Sig Start Date End Date Taking? Authorizing Provider  acetaminophen (TYLENOL) 500 MG tablet Take 500-1,000 mg by mouth 4 (four) times daily as needed for mild pain or moderate pain.    [provider]  allopurinol (ZYLOPRIM) 300 MG tablet Take 300 mg by mouth daily.    [provider]  atenolol (TENORMIN) 50 MG tablet Take 50 mg by mouth daily.     [provider]  atorvastatin (LIPITOR) 40 MG tablet Take 40 mg by mouth daily.     [provider]  diphenoxylate-atropine (LOMOTIL) 2.5-0.025 MG tablet Take 1-2 tablets by mouth 3 (three) times daily as needed. 08/20/22   [provider]  furosemide (LASIX) 40 MG tablet Take 40 mg by mouth daily.    [provider]  gentamicin cream (GARAMYCIN) 0.1 % Apply 1 Application topically in the morning and at bedtime.    [provider]  HYDROcodone-acetaminophen (NORCO) 7.5-325 MG tablet Take 1 tablet by mouth 2 (two) times daily as needed for moderate pain or severe pain.    [provider]  levothyroxine (SYNTHROID) 88 MCG tablet Take 88 mcg by mouth daily.    [provider]  lisinopril (ZESTRIL) 5 MG tablet Take 5 mg by mouth daily. 09/24/22 09/24/23  [provider]  metFORMIN (GLUCOPHAGE) 1000 MG tablet Take 1,000 mg by mouth in the morning and at bedtime.    [provider]  sitaGLIPtin (JANUVIA) 50 MG tablet Take 50 mg by mouth daily.    [provider]  vitamin B-12 (CYANOCOBALAMIN) 1000 MCG tablet Take 1,000 mcg by mouth daily.    [provider]    Physical Exam: Vitals:   11/01/22 2230 11/01/22 2300 11/01/22 2304 11/02/22 0000  BP: (!) 152/81 (!) 162/92  (!) 143/90  Pulse: (!) 104 (!) 108  (!) 102  Resp: (!) 23 (!) 21  (!) 26  Temp:   (!) 100.4 F (38 C)   TempSrc:   Rectal   SpO2: 100% 98%  95%  Weight:      Height:       Physical  Exam Vitals and nursing note reviewed.  Constitutional:      General: She is not in acute distress.    Appearance: She is ill-appearing and toxic-appearing.  HENT:     Head: Normocephalic and atraumatic.  Cardiovascular:     Rate and Rhythm: Regular rhythm. Tachycardia present.     Heart sounds: Normal heart sounds.  Pulmonary:     Effort: Tachypnea present.     Breath sounds: Normal breath sounds.  Abdominal:     Palpations: Abdomen is soft.     Tenderness: There is no abdominal tenderness.  Skin:    Comments: Right heel in bandage.  Ulcerated lesion medial aspect left MTP joint  Neurological:     General: No focal deficit present.     Mental Status: She is lethargic.     Labs on Admission: I have personally reviewed  following labs and imaging studies  CBC: Recent Labs  Lab 11/01/22 1947  WBC 17.3*  NEUTROABS 14.5*  HGB 9.0*  HCT 30.1*  MCV 82.5  PLT 440*   Basic Metabolic Panel: Recent Labs  Lab 11/01/22 1947  NA 140  K 3.1*  CL 104  CO2 20*  GLUCOSE 180*  BUN 12  CREATININE 0.85  CALCIUM 9.1   GFR: Estimated Creatinine Clearance: 31.5 mL/min (by C-G formula based on SCr of 0.85 mg/dL). Liver Function Tests: Recent Labs  Lab 11/01/22 1947  AST 32  ALT 7  ALKPHOS 67  BILITOT 0.8  PROT 7.2  ALBUMIN 3.0*   No results for input(s): "LIPASE", "AMYLASE" in the last 168 hours. No results for input(s): "AMMONIA" in the last 168 hours. Coagulation Profile: Recent Labs  Lab 11/01/22 1947  INR 1.2   Cardiac Enzymes: No results for input(s): "CKTOTAL", "CKMB", "CKMBINDEX", "TROPONINI" in the last 168 hours. BNP (last 3 results) No results for input(s): "PROBNP" in the last 8760 hours. HbA1C: No results for input(s): "HGBA1C" in the last 72 hours. CBG: No results for input(s): "GLUCAP" in the last 168 hours. Lipid Profile: No results for input(s): "CHOL", "HDL", "LDLCALC", "TRIG", "CHOLHDL", "LDLDIRECT" in the last 72 hours. Thyroid Function  Tests: No results for input(s): "TSH", "T4TOTAL", "FREET4", "T3FREE", "THYROIDAB" in the last 72 hours. Anemia Panel: No results for input(s): "VITAMINB12", "FOLATE", "FERRITIN", "TIBC", "IRON", "RETICCTPCT" in the last 72 hours. Urine analysis:    Component Value Date/Time   COLORURINE STRAW (A) 11/01/2022 1947   APPEARANCEUR CLEAR (A) 11/01/2022 1947   APPEARANCEUR Cloudy (A) 09/08/2018 1519   LABSPEC 1.006 11/01/2022 1947   LABSPEC 1.016 05/20/2012 1345   PHURINE 7.0 11/01/2022 1947   GLUCOSEU NEGATIVE 11/01/2022 1947   GLUCOSEU Negative 05/20/2012 1345   HGBUR NEGATIVE 11/01/2022 1947   BILIRUBINUR NEGATIVE 11/01/2022 1947   [place] Negative 09/08/2018 1519   BILIRUBINUR Negative 05/20/2012 1345   KETONESUR 5 (A) 11/01/2022 1947   PROTEINUR NEGATIVE 11/01/2022 1947   NITRITE NEGATIVE 11/01/2022 1947   LEUKOCYTESUR NEGATIVE 11/01/2022 1947   LEUKOCYTESUR 3+ 05/20/2012 1345    Radiological Exams on Admission: CT ABDOMEN PELVIS W CONTRAST  Result Date: 11/01/2022 CLINICAL DATA:  abd pain, hx of diverticulitis EXAM: CT ABDOMEN AND PELVIS WITH CONTRAST TECHNIQUE: Multidetector CT imaging of the abdomen and pelvis was performed using the standard protocol following bolus administration of intravenous contrast. RADIATION DOSE REDUCTION: This exam was performed according to the departmental dose-optimization program which includes automated exposure control, adjustment of the mA and/or kV according to patient size and/or use of iterative reconstruction technique. CONTRAST:  1m OMNIPAQUE IOHEXOL 300 MG/ML  SOLN COMPARISON:  CT abdomen pelvis 09/26/2018, PET CT 12/23/2012, CT abdomen pelvis 08/22/2022 FINDINGS: Lower chest: Trace right pleural effusion. Coronary artery and mitral annular calcification. Small hiatal hernia. Hepatobiliary: No focal liver abnormality. Status post cholecystectomy. No biliary dilatation. Pancreas: No focal lesion. Normal pancreatic contour. No surrounding  inflammatory changes. No main pancreatic ductal dilatation. Spleen: Normal in size without focal abnormality. Adrenals/Urinary Tract: No adrenal nodule bilaterally. Bilateral kidneys enhance symmetrically. Persistent bilateral mild hydronephrosis and hydroureter. Extrarenal pelvises bilaterally. The urinary bladder is distended with urine. Foci of gas within the urinary bladder lumen. Stomach/Bowel: Stomach is within normal limits. No evidence of bowel wall thickening or dilatation. Colonic diverticulosis. Appendix appears normal. Vascular/Lymphatic: The inferior vena cava is patent. Main portal vein, splenic, superior mesenteric veins are patent. Left external iliac and femoral vein vein thrombus (2:70).  No abdominal aorta or iliac aneurysm. Severe atherosclerotic plaque of the aorta and its branches. No abdominal, pelvic, or inguinal lymphadenopathy. Reproductive: Status post hysterectomy. No adnexal masses. Other: No intraperitoneal free fluid. No intraperitoneal free gas. No organized fluid collection. Musculoskeletal: No abdominal wall hernia or abnormality. Diffusely decreased bone density. No suspicious lytic or blastic osseous lesions. No acute displaced fracture. Multilevel severe degenerative changes of the spine. Mild retrolisthesis of L1 on L2, L2 on L3, L3 on L4. Grade 1 anterolisthesis L4 on L5. IMPRESSION: 1. 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 2. Trace right pleural effusion 3. Small hiatal hernia. 4. Urinary bladder distended with urine with persistent bilateral mild hydroureteronephrosis. This may reflect chronic changes of obstructive uropathy or reflux. Consider urologic consultation. 5. Foci within the urinary bladder lumen suggestive of recent instrumentation. Correlate with urinalysis for infection if clinically indicated. 6. Colonic diverticulosis with no acute diverticulitis. 7. Aortic Atherosclerosis (ICD10-I70.0) including coronary and mitral annular  calcifications. Electronically Signed   By: Iven Finn M.D.   On: 11/01/2022 22:46   DG Chest Port 1 View  Result Date: 11/01/2022 CLINICAL DATA:  Fever EXAM: PORTABLE CHEST 1 VIEW COMPARISON:  Chest radiograph dated 08/22/2022 FINDINGS: Patient is rotated to the right. Normal lung volumes. No focal consolidations. No pleural effusion or pneumothorax. The heart size and mediastinal contours are within normal limits. Right humeral anchor. IMPRESSION: Allowing for patient rotation, no evidence of acute cardiopulmonary disease. Electronically Signed   By: Darrin Nipper M.D.   On: 11/01/2022 20:38     Data Reviewed: Relevant notes from primary care and specialist visits, past discharge summaries as available in EHR, including Care Everywhere. Prior diagnostic testing as pertinent to current admission diagnoses Updated medications and problem lists for reconciliation ED course, including vitals, labs, imaging, treatment and response to treatment Triage notes, nursing and pharmacy notes and ED provider's notes Notable results as noted in HPI   Assessment and Plan: * Severe sepsis ([place]) Source is uncertain but potentially include wounds on right heel and left great toe as well as septic thrombophlebitis Continue sepsis fluids with monitoring for overload in view of history of diastolic CHF Continue Vanco, cefepime and Flagyl for sepsis of unknown source Close hemodynamic monitoring We will get echocardiogram Follow blood cultures Management of individual problems outlined below  Acute deep vein thrombosis (DVT) of femoral vein of left lower extremity (HCC) Continue heparin infusion Vascular consult Will keep n.p.o. in case of procedure  Acute metabolic encephalopathy Secondary to severe sepsis Will keep n.p.o. Fall and aspiration precautions  Lower limb ulcer, ankle, right, limited to breakdown of skin [facility]) Podiatry consult Patient follows at wound care center last seen 12/20 and  has home health at her residence  Chronic ulcer of great toe of left foot with fat layer exposed [facility]) Podiatry consult  Hypokalemia IV repletion as patient n.p.o.  Chronic diastolic CHF (congestive heart failure) (HCC) Clinically euvolemic to dry Blood pressure stable with systolic in the 924Q and 683M Will continue atenolol, lisinopril but will hold Lasix in view of sepsis  Acute on chronic urinary retention CT abdomen and pelvis showing possible chronic obstructive uropathy with bladder distention and mild hydronephrosis Patient follows with urology and was last seen on 12/6 when temporary Foley was DC'd Patient has been voiding well with p.o. awake in the ED so will hold off on Foley at this time  Chronic anemia Hemoglobin stable around 9  History of mastectomy, right History of  breast cancer s/p radiation and mastectomy No acute issues  Hypertension Continue atenolol and lisinopril  Diabetes mellitus, type 2 (HCC) Sliding scale insulin coverage        DVT prophylaxis: Heparin infusion  Consults: Vascular, Dr. Franchot Gallo, podiatry, Dr. Vickki Muff  Advance Care Planning:   Code Status: Full Code   Family Communication: Son, Lalah Durango at bedside  Disposition Plan: Back to previous home environment  Severity of Illness: The appropriate patient status for this patient is INPATIENT. Inpatient status is judged to be reasonable and necessary in order to provide the required intensity of service to ensure the patient's safety. The patient's presenting symptoms, physical exam findings, and initial radiographic and laboratory data in the context of their chronic comorbidities is felt to place them at high risk for further clinical deterioration. Furthermore, it is not anticipated that the patient will be medically stable for discharge from the hospital within 2 midnights of admission.   * I certify that at the point of admission it is my clinical judgment that the  patient will require inpatient hospital care spanning beyond 2 midnights from the point of admission due to high intensity of service, high risk for further deterioration and high frequency of surveillance required.*  Author: Athena Masse, MD 11/02/2022 1:59 AM  For on call review [email or web address].

## 2022-11-02 NOTE — Assessment & Plan Note (Addendum)
Source is uncertain but potentially include wounds on right heel and left great toe as well as septic thrombophlebitis.  Patient received IV fluid and broad-spectrum antibiotics per sepsis protocol.  Lactic acidosis has been resolved. Podiatry ordered right heel imaging which shows a concern of osteomyelitis. Most likely will need debridement and bone biopsy-will defer that decision to podiatry. Continue Vanco, cefepime and Flagyl for now Close hemodynamic monitoring Follow blood cultures

## 2022-11-02 NOTE — Op Note (Signed)
Operative note   Surgeon:Juvenal Umar Lawyer: None    Preop diagnosis: Posterior right heel decubitus ulcer and left great toe joint ulceration    Postop diagnosis: Same    Procedure: 1 full-thickness debridement down to bone posterior right heel ulcer 2.  Biopsy of bone right heel 3.  Evaluation left foot ulcer with debridement    EBL: Minimal    Anesthesia:local and IV sedation a total of 10 cc of lidocaine with epinephrine and 0.5% bupivacaine and 1:1 mixture was infiltrated around the ulcerative site on the right heel    Hemostasis: Lidocaine with epinephrine    Specimen: Bone for pathology and bone for culture from the right heel    Complications: None    Operative indications:[name] is an 81 y.o. that presents today for surgical intervention.  The risks/benefits/alternatives/complications have been discussed and consent has been given.    Procedure:  Patient was brought into the OR and placed on the operating table in thelateral position. After anesthesia was obtained theright lower extremity was prepped and draped in usual sterile fashion.  Attention was directed to the posterior aspect of the right heel where a large 3 x 4 cm open ulceration is noted.  Purulent drainage was noted from the area at this time.  A Versajet was used to excisionally debride the entire portion of the posterior aspect of the right heel down to the level of bone.  Postdebridement measurements were 3 x 4 cm.  Debridement of marked fibrotic tissue was performed.  At this time the bone biopsy was performed.  A Jamshidi needle was used to infiltrate the posterior calcaneus.  2 small portions of bone were sent for pathology and microbiology.  A bulky sterile dressing was applied to the right posterior heel.  The left great toe ulceration was evaluated intraoperatively.  At this time no noted infection drainage or need for debridement was performed.  A padded silicone border foam pad was  applied to the left great toe joint ulcerative site.    Patient tolerated the procedure and anesthesia well.  Was transported from the OR to the PACU with all vital signs stable and vascular status intact. To be discharged per routine protocol.  Will follow up in approximately 1 week in the outpatient clinic.

## 2022-11-02 NOTE — ED Notes (Signed)
Verbal order from Dr. Damita Dunnings may d/c insert foley cath order, pt has been having adequate urine output.

## 2022-11-02 NOTE — Assessment & Plan Note (Signed)
Podiatry consult

## 2022-11-02 NOTE — Assessment & Plan Note (Addendum)
Podiatry consult-right heel x-ray with concern of osteomyelitis.  Might need debridement and bone biopsy. Patient follows at wound care center last seen 12/20 and has home health at her residence

## 2022-11-02 NOTE — Assessment & Plan Note (Signed)
Sliding scale insulin coverage 

## 2022-11-02 NOTE — Assessment & Plan Note (Addendum)
Clinically euvolemic to dry Blood pressure stable with systolic in the 588T and 254D Will continue atenolol, lisinopril but will hold Lasix in view of sepsis Repeat echocardiogram was ordered by admitting provider.

## 2022-11-02 NOTE — ED Notes (Signed)
PT to OR

## 2022-11-02 NOTE — Progress Notes (Signed)
[place] for Heparin  Indication: DVT  Allergies  Allergen Reactions   Other Anaphylaxis    Anesthisia has been a health issue for her in the past.    Sulfa Antibiotics Rash    Patient Measurements: Height: '4\' 11"'$  (149.9 cm) Weight: 37.2 kg (82 lb) IBW/kg (Calculated) : 43.2 Heparin Dosing Weight: 37.2 kg   Vital Signs: Temp: 99.3 F (37.4 C) (12/22 0915) Temp Source: Oral (12/22 0915) BP: 143/72 (12/22 0915) Pulse Rate: 90 (12/22 0915)  Labs: Recent Labs    11/01/22 1947 11/02/22 0501  HGB 9.0* 8.3*  HCT 30.1* 27.6*  PLT 624* 514*  APTT 32  --   LABPROT 14.7  --   INR 1.2  --   CREATININE 0.85 0.69    Estimated Creatinine Clearance: 33.5 mL/min (by C-G formula based on SCr of 0.69 mg/dL).  Medical History: Past Medical History:  Diagnosis Date   Arthritis    Arthritis    Breast cancer ([place]) 1992   right breast with lumpectomy and rad tx   Cancer of right breast ([place]) 04/16/2013   right breast with mastectomy   Diabetes mellitus without complication (HCC)    Gout    High cholesterol    Hyperlipidemia    Hypertension    Personal history of radiation therapy     Assessment: Pharmacy consulted to dose heparin for DVT treatment in this 81 year old female admitted with sepsis.  No prior anticoag noted. CrCl = 31.5 ml/min  Goal of Therapy:  Heparin level 0.3-0.7 units/ml Monitor platelets by anticoagulation protocol: Yes   Results: 12/22@ 0853  HL < 0.10, subtherapeutic @ 600 un/hr > 750 un/hr  Plan:  Give 1000 units bolus x 1 Increase  heparin infusion rate to  750 units/hr Check anti-Xa level in 8 hours and daily while on heparin Continue to monitor H&H and platelets  Cuinn Westerhold Rodriguez-Guzman PharmD, BCPS 11/02/2022 10:36 AM

## 2022-11-02 NOTE — Progress Notes (Signed)
[place] for Heparin  Indication: DVT  Allergies  Allergen Reactions   Other Anaphylaxis    Anesthisia has been a health issue for her in the past.    Sulfa Antibiotics Rash    Patient Measurements: Height: '4\' 11"'$  (149.9 cm) Weight: 37.2 kg (82 lb) IBW/kg (Calculated) : 43.2 Heparin Dosing Weight: 37.2 kg   Vital Signs: Temp: 98.8 F (37.1 C) (12/22 2151) Temp Source: Oral (12/22 1450) BP: 133/72 (12/22 2151) Pulse Rate: 81 (12/22 2151)  Labs: Recent Labs    11/01/22 1947 11/02/22 0501 11/02/22 0853 11/02/22 2204  HGB 9.0* 8.3*  --   --   HCT 30.1* 27.6*  --   --   PLT 624* 514*  --   --   APTT 32  --   --   --   LABPROT 14.7  --   --   --   INR 1.2  --   --   --   HEPARINUNFRC  --   --  <0.10* 0.15*  CREATININE 0.85 0.69  --   --     Estimated Creatinine Clearance: 33.5 mL/min (by C-G formula based on SCr of 0.69 mg/dL).  Medical History: Past Medical History:  Diagnosis Date   Arthritis    Arthritis    Breast cancer ([place]) 1992   right breast with lumpectomy and rad tx   Cancer of right breast ([place]) 04/16/2013   right breast with mastectomy   Diabetes mellitus without complication (HCC)    Gout    High cholesterol    Hyperlipidemia    Hypertension    Personal history of radiation therapy     Assessment: Pharmacy consulted to dose heparin for DVT treatment in this 81 year old female admitted with sepsis.  No prior anticoag noted. CrCl = 31.5 ml/min  Goal of Therapy:  Heparin level 0.3-0.7 units/ml Monitor platelets by anticoagulation protocol: Yes   Results: 12/22@ 0853  HL < 0.10, subtherapeutic @ 600 un/hr > 750 un/hr  12/22 @ 2204 HL = 0.15, SUBtherapeutic  -  eMar says heparin gtt restarted after procedure @ 1017 so this level may not be valid.   Plan:  12/22:  HL @ 2204 = 0.15, SUBtherapeutic - eMar says heparin gtt restarted @ 1827 post procedure,   RN checked but was unable to confirm how long  heparin was held so this level is probably not valid. - Will continue this pt on current rate and recheck HL 8 hrs from documented restart on 12/23 @ 0230.  Continue to monitor H&H and platelets  Reeta Kuk D 11/02/2022 11:25 PM

## 2022-11-02 NOTE — Progress Notes (Signed)
Nurse handoff report given to [place], PACU RN.

## 2022-11-02 NOTE — Anesthesia Preprocedure Evaluation (Signed)
Anesthesia Evaluation  Patient identified by MRN, date of birth, ID band Patient awake    Reviewed: Allergy & Precautions, H&P , NPO status , Patient's Chart, lab work & pertinent test results, reviewed documented beta blocker date and time   History of Anesthesia Complications Negative for: history of anesthetic complications  Airway Mallampati: II  TM Distance: >3 FB Neck ROM: full    Dental  (+) Poor Dentition, Missing, Dental Advidsory Given   Pulmonary neg pulmonary ROS   Pulmonary exam normal breath sounds clear to auscultation       Cardiovascular Exercise Tolerance: Good hypertension, (-) angina + Peripheral Vascular Disease, +CHF and + DVT  (-) Past MI and (-) Cardiac Stents Normal cardiovascular exam(-) dysrhythmias (-) Valvular Problems/Murmurs Rhythm:regular Rate:Normal     Neuro/Psych negative neurological ROS  negative psych ROS   GI/Hepatic negative GI ROS, Neg liver ROS,,,  Endo/Other  diabetesHypothyroidism    Renal/GU negative Renal ROS  negative genitourinary   Musculoskeletal   Abdominal   Peds  Hematology negative hematology ROS (+)   Anesthesia Other Findings Past Medical History: No date: Arthritis No date: Arthritis 1992: Breast cancer ([place])     Comment:  right breast with lumpectomy and rad tx 04/16/2013: Cancer of right breast ([place])     Comment:  right breast with mastectomy No date: Diabetes mellitus without complication (HCC) No date: Gout No date: High cholesterol No date: Hyperlipidemia No date: Hypertension No date: Personal history of radiation therapy   Reproductive/Obstetrics negative OB ROS                             Anesthesia Physical Anesthesia Plan  ASA: 3  Anesthesia Plan: General   Post-op Pain Management:    Induction: Intravenous  PONV Risk Score and Plan: 3 and Propofol infusion, TIVA and Treatment may vary due to age or medical  condition  Airway Management Planned: Natural Airway  Additional Equipment:   Intra-op Plan:   Post-operative Plan:   Informed Consent: I have reviewed the patients History and Physical, chart, labs and discussed the procedure including the risks, benefits and alternatives for the proposed anesthesia with the patient or authorized representative who has indicated his/her understanding and acceptance.     Dental Advisory Given  Plan Discussed with: Anesthesiologist, CRNA and Surgeon  Anesthesia Plan Comments:        Anesthesia Quick Evaluation

## 2022-11-02 NOTE — Assessment & Plan Note (Signed)
Continue atenolol and lisinopril

## 2022-11-02 NOTE — Consult Note (Cosign Needed Addendum)
[facility] Vascular Consult Note  MRN : [id number]  SHADEE [name] is a 81 y.o. (January 16, 1941) female who presents with chief complaint of  Chief Complaint  Patient presents with   Code Sepsis  .   Consulting Physician:Dr Nance Pear MD Reason for consult:Right Lower Extremity Ileofemoral DVT History of Present Illness: [name] is a 81 y.o. female with medical history significant for hypertension,diabetes mellitus, hypothyroidism, gout, breast cancer (s/p radiation and mastectomy), PVD, iron deficiency anemia, dCHF, hospitalized in October 2023 with bilateral lower extremity cellulitis, wheelchair-bound at baseline and with a chronic left heel ulcer, right toe ulcer and sacral decubitus, managed by wound care and podiatry, who presents to the ED with altered mental status.   CT abdomen and pelvis showing left external iliac and femoral vein thrombus and chronic obstructive uropathy with mild hydro ureteral nephrosis. Patient was in stretcher today on 4 liters nasal cannula and resting comfortably. Patient was slow in answering questions today. She was able to tell me she lived alone in assisted living facility.   Current Facility-Administered Medications  Medication Dose Route Frequency Provider Last Rate Last Admin   [MAR Hold] acetaminophen (TYLENOL) tablet 650 mg  650 mg Oral Q6H PRN Athena Masse, MD   650 mg at 11/02/22 1456   Or   [MAR Hold] acetaminophen (TYLENOL) suppository 650 mg  650 mg Rectal Q6H PRN Athena Masse, MD   650 mg at 11/02/22 0343   [MAR Hold] allopurinol (ZYLOPRIM) tablet 300 mg  300 mg Oral Daily Judd Gaudier V, MD   300 mg at 11/02/22 1026   [MAR Hold] atenolol (TENORMIN) tablet 50 mg  50 mg Oral Daily Athena Masse, MD   50 mg at 11/02/22 1026   [MAR Hold] atorvastatin (LIPITOR) tablet 40 mg  40 mg Oral Daily Athena Masse, MD   40 mg at 11/02/22 1027   [MAR Hold] ceFEPIme (MAXIPIME) 2 g in sodium chloride 0.9  % 100 mL IVPB  2 g Intravenous Q12H Athena Masse, MD   Stopped at 11/02/22 0959   chlorhexidine (HIBICLENS) 4 % liquid 4 Application  60 mL Topical Once Samara Deist, DPM       fentaNYL (SUBLIMAZE) injection 25-50 mcg  25-50 mcg Intravenous Q5 min PRN Martha Clan, MD       heparin ADULT infusion 100 units/mL (25000 units/212m)  750 Units/hr Intravenous Continuous FSamara Deist DPM 7.5 mL/hr at 11/02/22 1827 750 Units/hr at 11/02/22 1827   [MAR Hold] HYDROcodone-acetaminophen ([place] 7.5-325 MG per tablet 1 tablet  1 tablet Oral BID PRN DAthena Masse MD       [facility] insulin aspart (novoLOG) injection 0-9 Units  0-9 Units Subcutaneous Q4H DAthena Masse MD   1 Units at 11/02/22 1321   lactated ringers infusion   Intravenous Continuous FSamara Deist DPM 50 mL/hr at 11/02/22 1930 New Bag at 11/02/22 1930   [MAR Hold] levothyroxine (SYNTHROID) tablet 88 mcg  88 mcg Oral Q0600 DAthena Masse MD   88 mcg at 11/02/22 0535   [MAR Hold] lisinopril (ZESTRIL) tablet 5 mg  5 mg Oral Daily DJudd GaudierV, MD   5 mg at 11/02/22 1028   [MAR Hold] metroNIDAZOLE (FLAGYL) IVPB 500 mg  500 mg Intravenous Q12H DAthena Masse MD   Stopped at 11/02/22 1444   [MAR Hold] ondansetron (ZOFRAN) tablet 4 mg  4 mg Oral Q6H PRN DAthena Masse MD  Or   [MAR Hold] ondansetron (ZOFRAN) injection 4 mg  4 mg Intravenous Q6H PRN Athena Masse, MD       phenylephrine 80 mcg/10 mL injection            povidone-iodine 10 % swab 2 Application  2 Application Topical Once Samara Deist, DPM       [MAR Hold] vancomycin (VANCOREADY) IVPB 750 mg/150 mL  750 mg Intravenous Q48H Athena Masse, MD       Facility-Administered Medications Ordered in Other Encounters  Medication Dose Route Frequency Provider Last Rate Last Admin   lactated ringers infusion   Intravenous Continuous PRN Beverely Low, CRNA   Stopped at 11/02/22 1746   propofol (DIPRIVAN) 500 MG/50ML infusion   Intravenous Continuous PRN Nelda [place], CRNA   Stopped at 11/02/22 1746    Past Medical History:  Diagnosis Date   Arthritis    Arthritis    Breast cancer ([place]) 1992   right breast with lumpectomy and rad tx   Cancer of right breast ([place]) 04/16/2013   right breast with mastectomy   Diabetes mellitus without complication ([place])    Gout    High cholesterol    Hyperlipidemia    Hypertension    Personal history of radiation therapy     Past Surgical History:  Procedure Laterality Date   ABDOMINAL HYSTERECTOMY     ANKLE FRACTURE SURGERY Right 2004   BREAST LUMPECTOMY Right 1992   positive   MASTECTOMY Right 2014   SHOULDER SURGERY Right 2010    Social History Social History   Tobacco Use   Smoking status: Never    Passive exposure: Never   Smokeless tobacco: Never  Vaping Use   Vaping Use: Never used  Substance Use Topics   Alcohol use: No    Alcohol/week: 0.0 standard drinks of alcohol   Drug use: No    Family History Family History  Problem Relation Age of Onset   Diabetes Father    Breast cancer Neg Hx     Allergies  Allergen Reactions   Other Anaphylaxis    Anesthisia has been a health issue for her in the past.    Sulfa Antibiotics Rash     REVIEW OF SYSTEMS (Negative unless checked)  Constitutional: '[]'$ Weight loss  '[x]'$ Fever  '[x]'$ Chills Cardiac: '[]'$ Chest pain   '[]'$ Chest pressure   '[]'$ Palpitations   '[]'$ Shortness of breath when laying flat   '[]'$ Shortness of breath at rest   '[]'$ Shortness of breath with exertion. Vascular:  '[]'$ Pain in legs with walking   '[x]'$ Pain in legs at rest   '[x]'$ Pain in legs when laying flat   '[]'$ Claudication   '[]'$ Pain in feet when walking  '[x]'$ Pain in feet at rest  '[x]'$ Pain in feet when laying flat   '[x]'$ History of DVT   '[]'$ Phlebitis   '[]'$ Swelling in legs   '[]'$ Varicose veins   '[]'$ Non-healing ulcers Pulmonary:   '[]'$ Uses home oxygen   '[]'$ Productive cough   '[]'$ Hemoptysis   '[]'$ Wheeze  '[]'$ COPD   '[]'$ Asthma Neurologic:  '[]'$ Dizziness  '[]'$ Blackouts   '[]'$ Seizures   '[]'$ History of stroke   '[]'$ History of TIA   '[]'$ Aphasia   '[]'$ Temporary blindness   '[]'$ Dysphagia   '[]'$ Weakness or numbness in arms   '[]'$ Weakness or numbness in legs Musculoskeletal:  '[]'$ Arthritis   '[]'$ Joint swelling   '[]'$ Joint pain   '[]'$ Low back pain Hematologic:  '[]'$ Easy bruising  '[]'$ Easy bleeding   '[]'$ Hypercoagulable state   '[]'$ Anemic  '[]'$ Hepatitis Gastrointestinal:  '[]'$ Blood in stool   '[]'$ Vomiting blood  '[]'$   Gastroesophageal reflux/heartburn   '[]'$ Difficulty swallowing. Genitourinary:  '[]'$ Chronic kidney disease   '[]'$ Difficult urination  '[]'$ Frequent urination  '[]'$ Burning with urination   '[]'$ Blood in urine Skin:  '[]'$ Rashes   '[x]'$ Ulcers   '[]'$ Wounds Psychological:  '[]'$ History of anxiety   '[]'$  History of major depression.  Physical Examination  Vitals:   11/02/22 2000 11/02/22 2015 11/02/22 2030 11/02/22 2045  BP: 103/63 (!) 104/52 108/80 111/69  Pulse: 74 69 75 75  Resp: 19 19 (!) 24 19  Temp:      TempSrc:      SpO2: 92% 94% 94% 90%  Weight:      Height:       Body mass index is 16.56 kg/m. Gen:  WD/WN, NAD Head: [place]/AT, No temporalis wasting. Prominent temp pulse not noted. Ear/Nose/Throat: Hearing grossly intact, nares w/o erythema or drainage, oropharynx w/o Erythema/Exudate Eyes: Sclera non-icteric, conjunctiva clear Neck: Trachea midline.  No JVD.  Pulmonary:  Good air movement, respirations not labored, equal bilaterally.  Cardiac: RRR, normal S1, S2. Vascular: Left lower extremity with out palpable pulses. Lower extremity is warm to touch but very painful upon palpation Right lower extremity heel ulcer.  Vessel Right Left  Radial Palpable Palpable  Ulnar    Brachial    Carotid Palpable, without bruit Palpable, without bruit  Aorta Not palpable N/A  Femoral N/A Palpable  Popliteal N/A Palpable  PT N/A N/A  DP N/A N/A   Gastrointestinal: soft, non-tender/non-distended. No guarding/reflex.  Musculoskeletal: M/S 5/5 throughout.  Extremities without ischemic changes.  No deformity or atrophy. No edema. Neurologic: Sensation grossly intact in  extremities.  Symmetrical.  Speech is fluent. Motor exam as listed above. Psychiatric: Judgment intact, Mood & affect appropriate for pt's clinical situation. Dermatologic: No rashes noted.  No cellulitis or Positive open wound right heel. Lymph : No Cervical, Axillary, or Inguinal lymphadenopathy.    CBC Lab Results  Component Value Date   WBC 15.1 (H) 11/02/2022   HGB 8.3 (L) 11/02/2022   HCT 27.6 (L) 11/02/2022   MCV 81.2 11/02/2022   PLT 514 (H) 11/02/2022    BMET    Component Value Date/Time   NA 135 11/02/2022 0501   NA 142 10/01/2014 1119   K 3.4 (L) 11/02/2022 0501   K 3.9 10/01/2014 1119   CL 102 11/02/2022 0501   CL 104 10/01/2014 1119   CO2 23 11/02/2022 0501   CO2 28 10/01/2014 1119   GLUCOSE 129 (H) 11/02/2022 0501   GLUCOSE 101 (H) 10/01/2014 1119   BUN 9 11/02/2022 0501   BUN 41 (H) 09/08/2018 1543   BUN 18 10/01/2014 1119   CREATININE 0.69 11/02/2022 0501   CREATININE 1.20 10/01/2014 1119   CALCIUM 8.5 (L) 11/02/2022 0501   CALCIUM 8.9 10/01/2014 1119   GFRNONAA >60 11/02/2022 0501   GFRNONAA 47 (L) 10/01/2014 1119   GFRNONAA 57 (L) 03/26/2014 0949   GFRAA 54 (L) 12/22/2018 1036   GFRAA 57 (L) 10/01/2014 1119   GFRAA >60 03/26/2014 0949   Estimated Creatinine Clearance: 33.5 mL/min (by C-G formula based on SCr of 0.69 mg/dL).  COAG Lab Results  Component Value Date   INR 1.2 11/01/2022   INR 1.3 (H) 08/22/2022    Radiology DG Os Calcis Right  Result Date: 11/02/2022 CLINICAL DATA:  Ulcer of heel, right, with necrosis of bone (HCC) EXAM: RIGHT OS CALCIS - 2+ VIEW COMPARISON:  Ankle radiograph 09/19/2015 FINDINGS: There is a posterior heel soft tissue ulcer with subcortical lucency in the posterior  calcaneus. No frank bony destruction. There is a large dorsal calcaneal spur and Haglund deformity, similar to prior exam. There is severe tibiotalar arthritis evidence of prior trauma and abandoned screw in the distal tibia. Moderate subtalar joint  arthritis. IMPRESSION: Posterior heel soft tissue ulcer adjacent subcortical lucency in the posterior calcaneus, suspicious for osteomyelitis. MRI would be more sensitive. Severe tibiotalar and moderate subtalar arthritis. Electronically Signed   By: Maurine Simmering M.D.   On: 11/02/2022 13:28   CT ABDOMEN PELVIS W CONTRAST  Result Date: 11/01/2022 CLINICAL DATA:  abd pain, hx of diverticulitis EXAM: CT ABDOMEN AND PELVIS WITH CONTRAST TECHNIQUE: Multidetector CT imaging of the abdomen and pelvis was performed using the standard protocol following bolus administration of intravenous contrast. RADIATION DOSE REDUCTION: This exam was performed according to the departmental dose-optimization program which includes automated exposure control, adjustment of the mA and/or kV according to patient size and/or use of iterative reconstruction technique. CONTRAST:  49m OMNIPAQUE IOHEXOL 300 MG/ML  SOLN COMPARISON:  CT abdomen pelvis 09/26/2018, PET CT 12/23/2012, CT abdomen pelvis 08/22/2022 FINDINGS: Lower chest: Trace right pleural effusion. Coronary artery and mitral annular calcification. Small hiatal hernia. Hepatobiliary: No focal liver abnormality. Status post cholecystectomy. No biliary dilatation. Pancreas: No focal lesion. Normal pancreatic contour. No surrounding inflammatory changes. No main pancreatic ductal dilatation. Spleen: Normal in size without focal abnormality. Adrenals/Urinary Tract: No adrenal nodule bilaterally. Bilateral kidneys enhance symmetrically. Persistent bilateral mild hydronephrosis and hydroureter. Extrarenal pelvises bilaterally. The urinary bladder is distended with urine. Foci of gas within the urinary bladder lumen. Stomach/Bowel: Stomach is within normal limits. No evidence of bowel wall thickening or dilatation. Colonic diverticulosis. Appendix appears normal. Vascular/Lymphatic: The inferior vena cava is patent. Main portal vein, splenic, superior mesenteric veins are patent. Left  external iliac and femoral vein vein thrombus (2:70). No abdominal aorta or iliac aneurysm. Severe atherosclerotic plaque of the aorta and its branches. No abdominal, pelvic, or inguinal lymphadenopathy. Reproductive: Status post hysterectomy. No adnexal masses. Other: No intraperitoneal free fluid. No intraperitoneal free gas. No organized fluid collection. Musculoskeletal: No abdominal wall hernia or abnormality. Diffusely decreased bone density. No suspicious lytic or blastic osseous lesions. No acute displaced fracture. Multilevel severe degenerative changes of the spine. Mild retrolisthesis of L1 on L2, L2 on L3, L3 on L4. Grade 1 anterolisthesis L4 on L5. IMPRESSION: 1. 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 2. Trace right pleural effusion 3. Small hiatal hernia. 4. Urinary bladder distended with urine with persistent bilateral mild hydroureteronephrosis. This may reflect chronic changes of obstructive uropathy or reflux. Consider urologic consultation. 5. Foci within the urinary bladder lumen suggestive of recent instrumentation. Correlate with urinalysis for infection if clinically indicated. 6. Colonic diverticulosis with no acute diverticulitis. 7. Aortic Atherosclerosis (ICD10-I70.0) including coronary and mitral annular calcifications. Electronically Signed   By: MIven FinnM.D.   On: 11/01/2022 22:46   DG Chest Port 1 View  Result Date: 11/01/2022 CLINICAL DATA:  Fever EXAM: PORTABLE CHEST 1 VIEW COMPARISON:  Chest radiograph dated 08/22/2022 FINDINGS: Patient is rotated to the right. Normal lung volumes. No focal consolidations. No pleural effusion or pneumothorax. The heart size and mediastinal contours are within normal limits. Right humeral anchor. IMPRESSION: Allowing for patient rotation, no evidence of acute cardiopulmonary disease. Electronically Signed   By: LDarrin NipperM.D.   On: 11/01/2022 20:38      Assessment/Plan 1. Left Ileofemoral DVT: Plan is  to clear Urosepis before attempting Left Ileofemoral Thrombectomy. Plan  next week to take to vascular surgery Lab for intervention 2. Right Heel Ulcer: Patient will need angiogram of right lower extremity to better evaluate right heel ulcer. Patient will need duplex Ultrasound of bilateral lower extremities with ABI's completed prior.    Plan of care discussed with Dr Ella Jubilee MD and he is in agreement with plan noted above.   Family Communication:  Total Time:75 I spent 75 minutes in this encounter including personally reviewing extensive medical records, personally reviewing imaging studies and compared to prior scans, counseling the patient, placing orders, coordinating care and performing appropriate documentation  Thank you for allowing [place] to participate in the care of this patient.   Drema Pry, NP [facility] [phone] (Office Phone) [phone] (Office Fax) [phone] (Pager)  11/02/2022 9:01 PM  Staff may message me via secure chat in [place]  but this may not receive immediate response,  please page for urgent matters!  Dictation software was used to generate the above note. Typos may occur and escape review, as with typed/written notes. Any error is purely unintentional.  Please contact me directly for clarity if needed.

## 2022-11-02 NOTE — Assessment & Plan Note (Addendum)
Continue heparin infusion Vascular consult-they will see the patient later today. Will keep n.p.o. in case of procedure-another message sent to vascular and podiatry both

## 2022-11-02 NOTE — ED Notes (Signed)
Pt had a small BM, brief and purwick changed, peri care completed.

## 2022-11-02 NOTE — Assessment & Plan Note (Signed)
IV repletion as patient n.p.o.

## 2022-11-02 NOTE — ED Notes (Signed)
Pt in bed, pt vomited some yellow liquid and had a small brown soft bm, pt has dressings on R heel, hip and sacrum, dressings intact, cleaned pt and changed gown and bedding. New pure wick placed, pt denies pain.  Pt turned onto L side.

## 2022-11-03 ENCOUNTER — Inpatient Hospital Stay
Admit: 2022-11-03 | Discharge: 2022-11-03 | Disposition: A | Discharge status: Home / Self Care | Payer: PPO | Attending: Podiatry | Admitting: Podiatry

## 2022-11-03 ENCOUNTER — Inpatient Hospital Stay: Payer: PPO

## 2022-11-03 DIAGNOSIS — G9341 Metabolic encephalopathy: Secondary | ICD-10-CM

## 2022-11-03 DIAGNOSIS — L97311 Non-pressure chronic ulcer of right ankle limited to breakdown of skin: Secondary | ICD-10-CM | POA: Diagnosis not present

## 2022-11-03 DIAGNOSIS — I82412 Acute embolism and thrombosis of left femoral vein: Secondary | ICD-10-CM | POA: Diagnosis not present

## 2022-11-03 DIAGNOSIS — A419 Sepsis, unspecified organism: Secondary | ICD-10-CM | POA: Diagnosis not present

## 2022-11-03 LAB — HEPARIN LEVEL (UNFRACTIONATED)
Heparin Unfractionated: 0.25 IU/mL — ABNORMAL LOW (ref 0.30–0.70)
Heparin Unfractionated: 0.38 IU/mL (ref 0.30–0.70)
Heparin Unfractionated: 0.28 IU/mL — ABNORMAL LOW (ref 0.30–0.70)

## 2022-11-03 LAB — GLUCOSE, CAPILLARY
Glucose-Capillary: 108 mg/dL — ABNORMAL HIGH (ref 70–99)
Glucose-Capillary: 80 mg/dL (ref 70–99)
Glucose-Capillary: 88 mg/dL (ref 70–99)
Glucose-Capillary: 92 mg/dL (ref 70–99)
Glucose-Capillary: 93 mg/dL (ref 70–99)
Glucose-Capillary: 93 mg/dL (ref 70–99)
Glucose-Capillary: 97 mg/dL (ref 70–99)

## 2022-11-03 LAB — URINE CULTURE: Culture: NO GROWTH

## 2022-11-03 LAB — ECHOCARDIOGRAM COMPLETE
Area-P 1/2: 2.32 cm2
Height: 59 in
S' Lateral: 2.7 cm
Weight: 1312 oz

## 2022-11-03 MED ORDER — HEPARIN BOLUS VIA INFUSION
550.0000 [IU] | Freq: Once | INTRAVENOUS | Status: AC
  Administered 2022-11-03: 550 [IU] via INTRAVENOUS
  Filled 2022-11-03: qty 550

## 2022-11-03 MED ORDER — TAMSULOSIN HCL 0.4 MG PO CAPS
0.4000 mg | ORAL_CAPSULE | Freq: Every day | ORAL | Status: DC
  Administered 2022-11-04 – 2022-11-09 (×5): 0.4 mg via ORAL
  Filled 2022-11-03 (×5): qty 1

## 2022-11-03 MED ORDER — ENSURE ENLIVE PO LIQD
237.0000 mL | Freq: Two times a day (BID) | ORAL | Status: DC
  Administered 2022-11-04 – 2022-11-08 (×5): 237 mL via ORAL

## 2022-11-03 MED ORDER — JUVEN PO PACK
1.0000 | PACK | Freq: Two times a day (BID) | ORAL | Status: DC
  Administered 2022-11-04 – 2022-11-08 (×7): 1 via ORAL

## 2022-11-03 MED ORDER — ADULT MULTIVITAMIN W/MINERALS CH
1.0000 | ORAL_TABLET | Freq: Every day | ORAL | Status: DC
  Administered 2022-11-04 – 2022-11-09 (×5): 1 via ORAL
  Filled 2022-11-03 (×5): qty 1

## 2022-11-03 NOTE — Progress Notes (Signed)
[place] for Heparin  Indication: DVT  Allergies  Allergen Reactions   Other Anaphylaxis    Anesthisia has been a health issue for her in the past.    Sulfa Antibiotics Rash    Patient Measurements: Height: '4\' 11"'$  (149.9 cm) Weight: 37.2 kg (82 lb) IBW/kg (Calculated) : 43.2 Heparin Dosing Weight: 37.2 kg   Vital Signs: Temp: 98.8 F (37.1 C) (12/22 2151) BP: 133/72 (12/22 2151) Pulse Rate: 81 (12/22 2151)  Labs: Recent Labs    11/01/22 1947 11/02/22 0501 11/02/22 0853 11/02/22 2204 11/03/22 0241  HGB 9.0* 8.3*  --   --   --   HCT 30.1* 27.6*  --   --   --   PLT 624* 514*  --   --   --   APTT 32  --   --   --   --   LABPROT 14.7  --   --   --   --   INR 1.2  --   --   --   --   HEPARINUNFRC  --   --  <0.10* 0.15* 0.25*  CREATININE 0.85 0.69  --   --   --     Estimated Creatinine Clearance: 33.5 mL/min (by C-G formula based on SCr of 0.69 mg/dL).  Medical History: Past Medical History:  Diagnosis Date   Arthritis    Arthritis    Breast cancer ([place]) 1992   right breast with lumpectomy and rad tx   Cancer of right breast ([place]) 04/16/2013   right breast with mastectomy   Diabetes mellitus without complication (HCC)    Gout    High cholesterol    Hyperlipidemia    Hypertension    Personal history of radiation therapy     Assessment: Pharmacy consulted to dose heparin for DVT treatment in this 81 year old female admitted with sepsis.  No prior anticoag noted. CrCl = 31.5 ml/min  Goal of Therapy:  Heparin level 0.3-0.7 units/ml Monitor platelets by anticoagulation protocol: Yes   Results: 12/22@ 0853  HL < 0.10, subtherapeutic @ 600 un/hr > 750 un/hr  12/22 @ 2204 HL = 0.15, SUBtherapeutic  -  eMar says heparin gtt restarted after procedure @ 0223 so this level may not be valid.   12/23 @ 0241 HL = 0.25, SUBtherapeutic   Plan:  12/22:  HL @ 2204 = 0.15, SUBtherapeutic - eMar says heparin gtt restarted @ 1827  post procedure,   RN checked but was unable to confirm how long heparin was held so this level is probably not valid. - Will continue this pt on current rate and recheck HL 8 hrs from documented restart on 12/23 @ 0230.   12/23:  HL @ 0241 = 0.25, SUBtherapeutic - HL still low so will order Heparin 550 units IV X 1 bolus and increase drip rate to 800 units/hr.  - Will recheck HL 8 hrs after rate change.   Continue to monitor H&H and platelets  Bakari Nikolai D 11/03/2022 3:13 AM

## 2022-11-03 NOTE — Anesthesia Postprocedure Evaluation (Signed)
Anesthesia Post Note  Patient: [name]  Procedure(s) Performed: IRRIGATION AND DEBRIDEMENT RIGHT FOOT AND BONE BIOPSY RIGHT FOOT (Right: Foot)  Patient location during evaluation: PACU Anesthesia Type: General Level of consciousness: awake Pain management: pain level controlled Vital Signs Assessment: post-procedure vital signs reviewed and stable Respiratory status: spontaneous breathing, nonlabored ventilation, respiratory function stable and patient connected to nasal cannula oxygen Cardiovascular status: blood pressure returned to baseline and stable Postop Assessment: no apparent nausea or vomiting Anesthetic complications: no   No notable events documented.   Last Vitals:  Vitals:   11/03/22 0331 11/03/22 0724  BP: (!) 133/59 123/73  Pulse: 80 71  Resp: 20   Temp: 36.8 C 37.2 C  SpO2: (!) 87% 98%    Last Pain:  Vitals:   11/02/22 2100  TempSrc:   PainSc: 0-No pain                 Martha Clan

## 2022-11-03 NOTE — Progress Notes (Signed)
Initial Nutrition Assessment  DOCUMENTATION CODES:   Underweight  INTERVENTION:  - Add Ensure Enlive po BID, each supplement provides 350 kcal and 20 grams of protein.  - Add MVI q day.   - Add Juven BID   NUTRITION DIAGNOSIS:   Increased nutrient needs related to wound healing as evidenced by estimated needs.  GOAL:   Patient will meet greater than or equal to 90% of their needs  MONITOR:   PO intake, Supplement acceptance  REASON FOR ASSESSMENT:   Consult Assessment of nutrition requirement/status  ASSESSMENT:   81 y.o. female admits related to AMS. PMH includes: arthritis, breast cancer, DM, HLD, HTN. Pt is currently receiving medical management related to severe sepsis with right heel wound with infection.  Meds include: lipitor, sliding scale insulin. Labs reviewed: K low.   The pt is out of room and currently oriented x1. No intakes documented this admission. Pt with increased nutrient needs related to wound healing. RN reports that the pt ate about 50% of her lunch tray. RD will also add Ensure BID for now to help pt meet her needs. Will continue to monitor PO intakes. Pt may also benefit from another bed weight as there has been a significant loss of weight in the past month if current weight is accurate.   NUTRITION - FOCUSED PHYSICAL EXAM:  Unable to assess, attempt at f/u.   Diet Order:   Diet Order             Diet Heart Room service appropriate? Yes; Fluid consistency: Thin  Diet effective now                   EDUCATION NEEDS:   Education needs have been addressed  Skin:  Skin Assessment: Skin Integrity Issues: Skin Integrity Issues:: Stage I, Stage III Stage I: Mid Sacrum; right hip Stage III: Right heel  Last BM:  11/03/22  Height:   Ht Readings from Last 1 Encounters:  11/01/22 '4\' 11"'$  (1.499 m)    Weight:   Wt Readings from Last 1 Encounters:  11/01/22 37.2 kg    Ideal Body Weight:     BMI:  Body mass index is 16.56  kg/m.  Estimated Nutritional Needs:   Kcal:  1300-1490 kcals  Protein:  55-75 gm  Fluid:  >/= 1.3 L  Thalia Bloodgood, RD, LDN, CNSC.

## 2022-11-03 NOTE — Progress Notes (Signed)
Daily Progress Note   Subjective  - 1 Day Post-Op  F/u right heel debridement and left first MTPJ ulceration.  Patient still confused.  Objective Vitals:   11/02/22 2115 11/02/22 2151 11/03/22 0331 11/03/22 0724  BP: 109/60 133/72 (!) 133/59 123/73  Pulse: 75 81 80 71  Resp: '19 18 20   '$ Temp:  98.8 F (37.1 C) 98.3 F (36.8 C) 99 F (37.2 C)  TempSrc:      SpO2: 93% 100% (!) 87% 98%  Weight:      Height:        Physical Exam: The right heel ulcer is stable.  Much improved from preop.  Minimal mixed fibrotic tissue with some granular tissue.  No purulent drainage.  Minimal bleeding on bandage at this time.  See clinical picture.  Postop picture:   Laboratory CBC    Component Value Date/Time   WBC 15.1 (H) 11/02/2022 0501   HGB 8.3 (L) 11/02/2022 0501   HGB 12.0 10/01/2014 1119   HCT 27.6 (L) 11/02/2022 0501   HCT 36.5 10/01/2014 1119   PLT 514 (H) 11/02/2022 0501   PLT 233 10/01/2014 1119    BMET    Component Value Date/Time   NA 135 11/02/2022 0501   NA 142 10/01/2014 1119   K 3.4 (L) 11/02/2022 0501   K 3.9 10/01/2014 1119   CL 102 11/02/2022 0501   CL 104 10/01/2014 1119   CO2 23 11/02/2022 0501   CO2 28 10/01/2014 1119   GLUCOSE 129 (H) 11/02/2022 0501   GLUCOSE 101 (H) 10/01/2014 1119   BUN 9 11/02/2022 0501   BUN 41 (H) 09/08/2018 1543   BUN 18 10/01/2014 1119   CREATININE 0.69 11/02/2022 0501   CREATININE 1.20 10/01/2014 1119   CALCIUM 8.5 (L) 11/02/2022 0501   CALCIUM 8.9 10/01/2014 1119   GFRNONAA >60 11/02/2022 0501   GFRNONAA 47 (L) 10/01/2014 1119   GFRNONAA 57 (L) 03/26/2014 0949   GFRAA 54 (L) 12/22/2018 1036   GFRAA 57 (L) 10/01/2014 1119   GFRAA >60 03/26/2014 0949    Assessment/Planning: Status postdebridement heel with bone biopsy right heel  Bone culture is currently negative for organisms.  Wound is stable at this time.  Dressing changes written for daily dressing changes with calcium alginate with silver.  Apply a padded  gauze dressing. Pressure reducing boots have been ordered as well. Patient has vascular surgery to evaluate in the near future. Continue with local wound care from podiatry standpoint.  At this point no further surgical plans for now.  Will await bone culture and bone pathology. If culture or pathology are consistent with osteomyelitis could continue with either local wound care or below the knee amputation No further intervention from podiatry standpoint at this time.  Please reconsult if podiatry needed further.  Will follow remotely at this time.   Samara Deist A  11/03/2022, 9:38 AM

## 2022-11-03 NOTE — Progress Notes (Signed)
[name] for Heparin  Indication: DVT  Allergies  Allergen Reactions   Other Anaphylaxis    Anesthisia has been a health issue for her in the past.    Sulfa Antibiotics Rash    Patient Measurements: Height: '4\' 11"'$  (149.9 cm) Weight: 37.2 kg (82 lb) IBW/kg (Calculated) : 43.2 Heparin Dosing Weight: 37.2 kg   Vital Signs: Temp: 98 F (36.7 C) (12/23 1946) Temp Source: Oral (12/23 1946) BP: 106/54 (12/23 1946) Pulse Rate: 71 (12/23 1946)  Labs: Recent Labs    11/01/22 1947 11/02/22 0501 11/02/22 0853 11/03/22 0241 11/03/22 1315 11/03/22 2101  HGB 9.0* 8.3*  --   --   --   --   HCT 30.1* 27.6*  --   --   --   --   PLT 624* 514*  --   --   --   --   APTT 32  --   --   --   --   --   LABPROT 14.7  --   --   --   --   --   INR 1.2  --   --   --   --   --   HEPARINUNFRC  --   --    < > 0.25* 0.38 0.28*  CREATININE 0.85 0.69  --   --   --   --    < > = values in this interval not displayed.    Estimated Creatinine Clearance: 33.5 mL/min (by C-G formula based on SCr of 0.69 mg/dL).  Medical History: Past Medical History:  Diagnosis Date   Arthritis    Arthritis    Breast cancer ([place]) 1992   right breast with lumpectomy and rad tx   Cancer of right breast ([place]) 04/16/2013   right breast with mastectomy   Diabetes mellitus without complication (HCC)    Gout    High cholesterol    Hyperlipidemia    Hypertension    Personal history of radiation therapy     Assessment: Pharmacy consulted to dose heparin for DVT treatment in this 81 year old female admitted with sepsis.  No prior anticoag noted. CrCl = 31.5 ml/min  Goal of Therapy:  Heparin level 0.3-0.7 units/ml Monitor platelets by anticoagulation protocol: Yes   Results: 12/22@ 0853  HL < 0.10, subtherapeutic @ 600 un/hr > 750 un/hr  12/22 @ 2204 HL = 0.15, SUBtherapeutic  -  eMar says heparin gtt restarted after procedure @ 6811 so this level may not be valid.    12/23 @ 0241 HL = 0.25, SUBtherapeutic   Date Time HL Rate/Comment 12/22 0853 <0.1 Subtherapeutic/ 600>750 12/22 2204 0.15 Subtherapeutic/ restarted after procedure '@1827'$  (not at steady state) 12/23 0241 0.25 Subtherapeutic/ 750 > 800 12/23 2101 0.28 Subtherapeutic / 800 > 850   Plan:  Give heparin bolus of 550 units x1 and increase infusion rate to 850 units/hr Check anti-Xa level in 8 hours and daily once consecutively therapeutic. Continue to monitor H&H and platelets daily while on heparin gtt.   Darrick Penna 11/03/2022 9:42 PM

## 2022-11-03 NOTE — Progress Notes (Signed)
Progress Note   Patient: [name]:[id number] DOB: [birth year] DOA: 11/01/2022     2 DOS: the patient was seen and examined on 11/03/2022   Brief hospital course: [name] is a 81 y.o. female with medical history significant for hypertension,diabetes mellitus, hypothyroidism, gout, breast cancer (s/p radiation and mastectomy), PVD, iron deficiency anemia, dCHF, hospitalized in October 2023 with bilateral lower extremity cellulitis, wheelchair-bound at baseline and with a chronic left heel ulcer, right toe ulcer and sacral decubitus, managed by wound care and podiatry, who presents to the ED with altered mental status. She was  febrile to 102 at the facility and was sent in as a code sepsis.   With EMS O2 sat was 88% on room air requiring 3 L to maintain sats in the high 90s.  ED Tmax 100.4, pulse 106.  BP 149/91 and O2 sat in the high 90s on room air.  WBC 17,000 with lactic acid 4.9-5.  CT abdomen and pelvis showing left external iliac and femoral vein thrombus and chronic obstructive uropathy with mild hydro ureteral nephrosis Placed on IV heparin in the emergency room. Patient also had right acute decubitus ulcer with infection, left great toe ulceration.  Patient had a debridement of the right heel by podiatry on 12/22. Patient is also seen by vascular surgery, planning for left lower extremity thrombectomy after the infection is better controlled.     Assessment and Plan: * Severe sepsis ([place]) Right heel wound with infection. Chronic ulcer of great toe of left foot with fat layer exposed ([place]) Patient is status post debridement, pending culture results.  Blood cultures so far has no growth.  Continue current antibiotics, adjust antibiotics when final culture results available. Sepsis is improving, hemodynamically stable.  Acute deep vein thrombosis (DVT) of femoral vein of left lower extremity (HCC) Currently on heparin drip, pending vascular intervention once  infection under control.  Acute metabolic encephalopathy Patient is more awake today, however, still have significant confusion. Will continue to follow, reassess mental status tomorrow.   Hypokalemia Potassium improved.  Chronic diastolic CHF (congestive heart failure) (HCC) No exacerbation.  Acute on chronic urinary retention Patient was able to void, I have discussed with RN, will perform bladder scan.  Chronic anemia Hemoglobin stable around 9  History of mastectomy, right History of breast cancer s/p radiation and mastectomy No acute issues  Hypertension Continue atenolol and lisinopril  Diabetes mellitus, type 2 (HCC) Sliding scale insulin coverage       Subjective:  Patient appeared confused, but no agitation.  Eating lunch, seem to have good appetite.  Physical Exam: Vitals:   11/02/22 2115 11/02/22 2151 11/03/22 0331 11/03/22 0724  BP: 109/60 133/72 (!) 133/59 123/73  Pulse: 75 81 80 71  Resp: '19 18 20   '$ Temp:  98.8 F (37.1 C) 98.3 F (36.8 C) 99 F (37.2 C)  TempSrc:      SpO2: 93% 100% (!) 87% 98%  Weight:      Height:       General exam: Appears calm and comfortable  Respiratory system: Clear to auscultation. Respiratory effort normal. Cardiovascular system: S1 & S2 heard, RRR. No JVD, murmurs, rubs, gallops or clicks. No pedal edema. Gastrointestinal system: Abdomen is nondistended, soft and nontender. No organomegaly or masses felt. Normal bowel sounds heard. Central nervous system: Alert and oriented x1.  No focal neurological deficits. Extremities: Symmetric 5 x 5 power. Skin: No rashes, lesions or ulcers Psychiatry:  Mood & affect appropriate.  Data Reviewed:  Reviewed all lab results.  Family Communication: unable to reach son  Disposition: Status is: Inpatient Remains inpatient appropriate because: Severity of disease, IV treatment.  Planned Discharge Destination: Skilled nursing facility    Time spent: 35  minutes  Author: Sharen Hones, MD 11/03/2022 2:08 PM  For on call review [email or web address].

## 2022-11-03 NOTE — Progress Notes (Signed)
  Echocardiogram 2D Echocardiogram has been performed.  [name] 11/03/2022, 2:01 PM

## 2022-11-03 NOTE — Progress Notes (Signed)
[facility] for Heparin  Indication: DVT  Allergies  Allergen Reactions   Other Anaphylaxis    Anesthisia has been a health issue for her in the past.    Sulfa Antibiotics Rash    Patient Measurements: Height: '4\' 11"'$  (149.9 cm) Weight: 37.2 kg (82 lb) IBW/kg (Calculated) : 43.2 Heparin Dosing Weight: 37.2 kg   Vital Signs: Temp: 99 F (37.2 C) (12/23 0724) BP: 123/73 (12/23 0724) Pulse Rate: 71 (12/23 0724)  Labs: Recent Labs    11/01/22 1947 11/02/22 0501 11/02/22 0853 11/02/22 2204 11/03/22 0241 11/03/22 1315  HGB 9.0* 8.3*  --   --   --   --   HCT 30.1* 27.6*  --   --   --   --   PLT 624* 514*  --   --   --   --   APTT 32  --   --   --   --   --   LABPROT 14.7  --   --   --   --   --   INR 1.2  --   --   --   --   --   HEPARINUNFRC  --   --    < > 0.15* 0.25* 0.38  CREATININE 0.85 0.69  --   --   --   --    < > = values in this interval not displayed.    Estimated Creatinine Clearance: 33.5 mL/min (by C-G formula based on SCr of 0.69 mg/dL).  Medical History: Past Medical History:  Diagnosis Date   Arthritis    Arthritis    Breast cancer ([place]) 1992   right breast with lumpectomy and rad tx   Cancer of right breast ([place]) 04/16/2013   right breast with mastectomy   Diabetes mellitus without complication (HCC)    Gout    High cholesterol    Hyperlipidemia    Hypertension    Personal history of radiation therapy     Assessment: Pharmacy consulted to dose heparin for DVT treatment in this 81 year old female admitted with sepsis.  No prior anticoag noted. CrCl = 31.5 ml/min  Goal of Therapy:  Heparin level 0.3-0.7 units/ml Monitor platelets by anticoagulation protocol: Yes   Results: 12/22@ 0853  HL < 0.10, subtherapeutic @ 600 un/hr > 750 un/hr  12/22 @ 2204 HL = 0.15, SUBtherapeutic  -  eMar says heparin gtt restarted after procedure @ 4270 so this level may not be valid.   12/23 @ 0241 HL = 0.25,  SUBtherapeutic   Plan:  12/23:  HL @ 1315 = 0.38, therapeutic x 1 - Continue heparin drip rate to 800 units/hr.  - Will check confirmatory HL in 8 hrs.  - Continue to monitor H&H and platelets  Dartha Rozzell A Damar Petit 11/03/2022 2:30 PM

## 2022-11-04 ENCOUNTER — Encounter: Payer: Self-pay | Admitting: Podiatry

## 2022-11-04 DIAGNOSIS — A419 Sepsis, unspecified organism: Secondary | ICD-10-CM | POA: Diagnosis not present

## 2022-11-04 DIAGNOSIS — G9341 Metabolic encephalopathy: Secondary | ICD-10-CM | POA: Diagnosis not present

## 2022-11-04 DIAGNOSIS — R339 Retention of urine, unspecified: Secondary | ICD-10-CM

## 2022-11-04 DIAGNOSIS — L97522 Non-pressure chronic ulcer of other part of left foot with fat layer exposed: Secondary | ICD-10-CM

## 2022-11-04 DIAGNOSIS — D75838 Other thrombocytosis: Secondary | ICD-10-CM | POA: Insufficient documentation

## 2022-11-04 DIAGNOSIS — I82412 Acute embolism and thrombosis of left femoral vein: Secondary | ICD-10-CM | POA: Diagnosis not present

## 2022-11-04 LAB — CBC
HCT: 23.8 % — ABNORMAL LOW (ref 36.0–46.0)
HCT: 23.1 % — ABNORMAL LOW (ref 36.0–46.0)
HCT: 28.1 % — ABNORMAL LOW (ref 36.0–46.0)
Hemoglobin: 7 g/dL — ABNORMAL LOW (ref 12.0–15.0)
Hemoglobin: 6.9 g/dL — ABNORMAL LOW (ref 12.0–15.0)
Hemoglobin: 8.6 g/dL — ABNORMAL LOW (ref 12.0–15.0)
MCH: 24.2 pg — ABNORMAL LOW (ref 26.0–34.0)
MCH: 24.6 pg — ABNORMAL LOW (ref 26.0–34.0)
MCH: 24.7 pg — ABNORMAL LOW (ref 26.0–34.0)
MCHC: 29.4 g/dL — ABNORMAL LOW (ref 30.0–36.0)
MCHC: 29.9 g/dL — ABNORMAL LOW (ref 30.0–36.0)
MCHC: 30.6 g/dL (ref 30.0–36.0)
MCV: 82.4 fL (ref 80.0–100.0)
MCV: 82.5 fL (ref 80.0–100.0)
MCV: 80.7 fL (ref 80.0–100.0)
Platelets: 406 10*3/uL — ABNORMAL HIGH (ref 150–400)
Platelets: 420 10*3/uL — ABNORMAL HIGH (ref 150–400)
Platelets: 403 10*3/uL — ABNORMAL HIGH (ref 150–400)
RBC: 2.89 MIL/uL — ABNORMAL LOW (ref 3.87–5.11)
RBC: 2.8 MIL/uL — ABNORMAL LOW (ref 3.87–5.11)
RBC: 3.48 MIL/uL — ABNORMAL LOW (ref 3.87–5.11)
RDW: 18.9 % — ABNORMAL HIGH (ref 11.5–15.5)
RDW: 18.7 % — ABNORMAL HIGH (ref 11.5–15.5)
RDW: 18.2 % — ABNORMAL HIGH (ref 11.5–15.5)
WBC: 12.7 10*3/uL — ABNORMAL HIGH (ref 4.0–10.5)
WBC: 13.1 10*3/uL — ABNORMAL HIGH (ref 4.0–10.5)
WBC: 10.6 10*3/uL — ABNORMAL HIGH (ref 4.0–10.5)
nRBC: 0 % (ref 0.0–0.2)
nRBC: 0 % (ref 0.0–0.2)
nRBC: 0 % (ref 0.0–0.2)

## 2022-11-04 LAB — IRON AND TIBC
Iron: 26 ug/dL — ABNORMAL LOW (ref 28–170)
Saturation Ratios: 14 % (ref 10.4–31.8)
TIBC: 185 ug/dL — ABNORMAL LOW (ref 250–450)
UIBC: 159 ug/dL

## 2022-11-04 LAB — GLUCOSE, CAPILLARY
Glucose-Capillary: 82 mg/dL (ref 70–99)
Glucose-Capillary: 93 mg/dL (ref 70–99)
Glucose-Capillary: 86 mg/dL (ref 70–99)
Glucose-Capillary: 105 mg/dL — ABNORMAL HIGH (ref 70–99)
Glucose-Capillary: 83 mg/dL (ref 70–99)

## 2022-11-04 LAB — VITAMIN B12: Vitamin B-12: 336 pg/mL (ref 180–914)

## 2022-11-04 LAB — HEPARIN LEVEL (UNFRACTIONATED): Heparin Unfractionated: 0.47 IU/mL (ref 0.30–0.70)

## 2022-11-04 LAB — ABO/RH: ABO/RH(D): O NEG

## 2022-11-04 LAB — FERRITIN: Ferritin: 248 ng/mL (ref 11–307)

## 2022-11-04 LAB — PREPARE RBC (CROSSMATCH)

## 2022-11-04 MED ORDER — SODIUM CHLORIDE 0.9% IV SOLUTION: Freq: Once | INTRAVENOUS | Status: DC

## 2022-11-04 MED ORDER — PANTOPRAZOLE SODIUM 40 MG IV SOLR
40.0000 mg | Freq: Two times a day (BID) | INTRAVENOUS | Status: DC
  Administered 2022-11-04 – 2022-11-07 (×6): 40 mg via INTRAVENOUS
  Filled 2022-11-04 (×6): qty 10

## 2022-11-04 NOTE — Progress Notes (Signed)
Progress Note   Patient: [name] IRW:[id number] DOB: August 22, 1941 DOA: 11/01/2022     3 DOS: the patient was seen and examined on 11/04/2022   Brief hospital course: ZAILA CREW is a 81 y.o. female with medical history significant for hypertension,diabetes mellitus, hypothyroidism, gout, breast cancer (s/p radiation and mastectomy), PVD, iron deficiency anemia, dCHF, hospitalized in October 2023 with bilateral lower extremity cellulitis, wheelchair-bound at baseline and with a chronic left heel ulcer, right toe ulcer and sacral decubitus, managed by wound care and podiatry, who presents to the ED with altered mental status. She was  febrile to 102 at the facility and was sent in as a code sepsis.   With EMS O2 sat was 88% on room air requiring 3 L to maintain sats in the high 90s.  ED Tmax 100.4, pulse 106.  BP 149/91 and O2 sat in the high 90s on room air.  WBC 17,000 with lactic acid 4.9-5.  CT abdomen and pelvis showing left external iliac and femoral vein thrombus and chronic obstructive uropathy with mild hydro ureteral nephrosis Placed on IV heparin in the emergency room. Patient also had right acute decubitus ulcer with infection, left great toe ulceration.  Patient had a debridement of the right heel by podiatry on 12/22. Patient is also seen by vascular surgery, planning for left lower extremity thrombectomy after the infection is better controlled. Hemoglobin dropped down to 7.0 on 12/24, transfuse, heparin discontinued, requested vascular surgery to place IVC filter.     Assessment and Plan:   Severe sepsis ([place]) Right heel wound with infection. Chronic ulcer of great toe of left foot with fat layer exposed ([place]) Patient is status post debridement, currently no growth.  Blood cultures negative.  Continue current antibiotics, adjust antibiotics when final culture results available. Sepsis have improved.  Acute on chronic anemia. Thrombocytosis. Hemoglobin  dropped down to 7.0, discussed with the patient son and the patient, agreeable for 1 unit PRBC. Continue monitor hemoglobin. Check iron B12 level Patient most likely has some internal bleeding from heparin, will discontinue heparin for now.  Patient currently does not seem to have any black stool or rectal bleeding.  Continue monitor closely, may consider GI consult if patient started have black stools.  For now, will start IV Protonix.   Acute deep vein thrombosis (DVT) of femoral vein of left lower extremity (HCC) I have discontinued heparin drip due to sudden drop of hemoglobin.  Request sent to Dr. Lorenso Courier for IVC filter.  She probably will not be able to start anticoagulation until hemoglobin was stable.   Acute metabolic encephalopathy Patient is more awake today, however, still have significant confusion. Will continue to follow, reassess mental status tomorrow.     Hypokalemia Continue monitoring electrolytes.   Chronic diastolic CHF (congestive heart failure) (HCC) No exacerbation.   Acute on chronic urinary retention Patient was able to void, however, residual still was high at 1000 mL, Foley catheter anchored, Flomax started.    History of mastectomy, right History of breast cancer s/p radiation and mastectomy No acute issues   Hypertension Continue atenolol and lisinopril   Diabetes mellitus, type 2 (HCC) Sliding scale insulin coverage     Subjective:  Patient feels better today, she no longer has any confusion.  She denies any abdominal pain or nausea vomiting.  No shortness of breath.  Physical Exam: Vitals:   11/03/22 1946 11/03/22 2335 11/04/22 0401 11/04/22 0852  BP: (!) 106/54 (!) 108/51 (!) 105/54 (!) 113/58  Pulse: 71 69 70 69  Resp: '17 19 17   '$ Temp: 98 F (36.7 C) 97.9 F (36.6 C) 98 F (36.7 C) 98.2 F (36.8 C)  TempSrc: Oral Oral Oral   SpO2: 97% 99% 100% 98%  Weight:      Height:       General exam: Appears calm and comfortable  Respiratory  system: Clear to auscultation. Respiratory effort normal. Cardiovascular system: S1 & S2 heard, RRR. No JVD, murmurs, rubs, gallops or clicks. No pedal edema. Gastrointestinal system: Abdomen is nondistended, soft and nontender. No organomegaly or masses felt. Normal bowel sounds heard. Central nervous system: Alert and oriented x3. No focal neurological deficits. Extremities: Symmetric 5 x 5 power. Skin: No rashes, lesions or ulcers Psychiatry: Mood & affect appropriate.   Data Reviewed:  Lab results reviewed.  Family Communication: Son updated over the phone.  Disposition: Status is: Inpatient Remains inpatient appropriate because: Severity of disease, IV treatment.  Planned Discharge Destination: Skilled nursing facility    Time spent: 50 minutes  Author: Sharen Hones, MD 11/04/2022 11:34 AM  For on call review [email or web address].

## 2022-11-04 NOTE — Progress Notes (Signed)
[facility] for Heparin  Indication: DVT  Allergies  Allergen Reactions   Other Anaphylaxis    Anesthisia has been a health issue for her in the past.    Sulfa Antibiotics Rash    Patient Measurements: Height: '4\' 11"'$  (149.9 cm) Weight: 37.2 kg (82 lb) IBW/kg (Calculated) : 43.2 Heparin Dosing Weight: 37.2 kg   Vital Signs: Temp: 98 F (36.7 C) (12/24 0401) Temp Source: Oral (12/24 0401) BP: 105/54 (12/24 0401) Pulse Rate: 70 (12/24 0401)  Labs: Recent Labs    11/01/22 1947 11/02/22 0501 11/02/22 0853 11/03/22 1315 11/03/22 2101 11/04/22 0656  HGB 9.0* 8.3*  --   --   --  7.0*  HCT 30.1* 27.6*  --   --   --  23.8*  PLT 624* 514*  --   --   --  406*  APTT 32  --   --   --   --   --   LABPROT 14.7  --   --   --   --   --   INR 1.2  --   --   --   --   --   HEPARINUNFRC  --   --    < > 0.38 0.28* 0.47  CREATININE 0.85 0.69  --   --   --   --    < > = values in this interval not displayed.    Estimated Creatinine Clearance: 33.5 mL/min (by C-G formula based on SCr of 0.69 mg/dL).  Medical History: Past Medical History:  Diagnosis Date   Arthritis    Arthritis    Breast cancer ([place]) 1992   right breast with lumpectomy and rad tx   Cancer of right breast ([place]) 04/16/2013   right breast with mastectomy   Diabetes mellitus without complication (HCC)    Gout    High cholesterol    Hyperlipidemia    Hypertension    Personal history of radiation therapy     Assessment: Pharmacy consulted to dose heparin for DVT treatment in this 81 year old female admitted with sepsis.  No prior anticoag noted. CrCl = 31.5 ml/min  Goal of Therapy:  Heparin level 0.3-0.7 units/ml Monitor platelets by anticoagulation protocol: Yes   Results: 12/22@ 0853  HL < 0.10, subtherapeutic @ 600 un/hr > 750 un/hr  12/22 @ 2204 HL = 0.15, SUBtherapeutic  -  eMar says heparin gtt restarted after procedure @ 7322 so this level may not be valid.    12/23 @ 0241 HL = 0.25, SUBtherapeutic   Date Time HL Rate/Comment 12/22 0853 <0.1 Subtherapeutic/ 600>750 12/22 2204 0.15 Subtherapeutic/ restarted after procedure '@1827'$  (not at steady state) 12/23 0241 0.25 Subtherapeutic/ 750 > 800 12/23 2101 0.28 Subtherapeutic / 800 > 850 12/24   0656   0.47      Therapeutic X 1 @ 850 units/hr   Plan:  12/24:  HL @ 0656 = 0.47, therapeutic X 1 Will continue pt on current rate and recheck HL in 8 hrs on 12/24 @ 1500.  Continue to monitor H&H and platelets daily while on heparin gtt.   Satine Hausner D 11/04/2022 7:21 AM

## 2022-11-04 NOTE — Progress Notes (Signed)
Pharmacy Antibiotic Note  [name] is a 81 y.o. female admitted on 11/01/2022 with sepsis. PMH significant for HTN, T2DM, hypothyroidism, gout, breast cancer (s/p radiation and mastectomy), PVD, IDA, dCHF, chronic left heel ulcer, right toe ulcer and sacral decubitus. Patient was hospitalized in October 2023 with BLE cellulitis. She received IV antibiotics (Zosyn, Cefepime, Vancomycin). 12/22 Xray of R heel suspicious for osteomyelitis. Pharmacy has been consulted for Vancomycin, Cefepime dosing.  Plan: Day 3 of antibiotics Continue Vancomycin 750 mg IV Q48H. Goal AUC 400-550. Expected AUC: 434.9 Expected Css min: 7.8 SCr used: 0.8 (actual 0.69)  Weight used: TBW (IBW > TBW), Vd used: 0.72 (BMI 16.5) Continue Cefepime 2 g IV Q12H Patient is also on Metronidazole 500 mg IV Q12H Continue to monitor renal function and follow culture results and surgical pathology   Height: '4\' 11"'$  (149.9 cm) Weight: 37.2 kg (82 lb) IBW/kg (Calculated) : 43.2  Temp (24hrs), Avg:98 F (36.7 C), Min:97.9 F (36.6 C), Max:98 F (36.7 C)  Recent Labs  Lab 11/01/22 1947 11/01/22 2209 11/02/22 0501 11/02/22 0853 11/02/22 1125 11/04/22 0656  WBC 17.3*  --  15.1*  --   --  12.7*  CREATININE 0.85  --  0.69  --   --   --   LATICACIDVEN 4.9* 5.0*  --  1.6 1.7  --      Estimated Creatinine Clearance: 33.5 mL/min (by C-G formula based on SCr of 0.69 mg/dL).    Allergies  Allergen Reactions   Other Anaphylaxis    Anesthisia has been a health issue for her in the past.    Sulfa Antibiotics Rash    Antimicrobials this admission: 12/21 Vancomycin >>  12/21 Cefepime >>  12/22 Metronidazole >>  Dose adjustments this admission: N/A  Microbiology results: 12/21 BCx: NG2D 12/21 UCx: NG final 12/21 Wound Cx: NG<24H 12/21 MRSA PCR: negative 12/22 Surg Path (R heel): IP   Thank you for allowing pharmacy to be a part of this patient's care.  Gretel Acre, PharmD PGY1 Pharmacy  Resident 11/04/2022 8:28 AM

## 2022-11-05 DIAGNOSIS — R6520 Severe sepsis without septic shock: Secondary | ICD-10-CM | POA: Diagnosis not present

## 2022-11-05 DIAGNOSIS — I82412 Acute embolism and thrombosis of left femoral vein: Secondary | ICD-10-CM | POA: Diagnosis not present

## 2022-11-05 DIAGNOSIS — A419 Sepsis, unspecified organism: Secondary | ICD-10-CM | POA: Diagnosis not present

## 2022-11-05 DIAGNOSIS — D649 Anemia, unspecified: Secondary | ICD-10-CM | POA: Diagnosis not present

## 2022-11-05 LAB — BASIC METABOLIC PANEL
Anion gap: 9 (ref 5–15)
BUN: 17 mg/dL (ref 8–23)
CO2: 22 mmol/L (ref 22–32)
Calcium: 7.9 mg/dL — ABNORMAL LOW (ref 8.9–10.3)
Chloride: 110 mmol/L (ref 98–111)
Creatinine, Ser: 0.75 mg/dL (ref 0.44–1.00)
GFR, Estimated: >60 mL/min (ref >60)
Glucose, Bld: 95 mg/dL (ref 70–99)
Potassium: 2.7 mmol/L — CL (ref 3.5–5.1)
Sodium: 141 mmol/L (ref 135–145)

## 2022-11-05 LAB — TYPE AND SCREEN
ABO/RH(D): O NEG
Antibody Screen: NEGATIVE
Unit division: 0

## 2022-11-05 LAB — CBC
HCT: 27.5 % — ABNORMAL LOW (ref 36.0–46.0)
Hemoglobin: 8.4 g/dL — ABNORMAL LOW (ref 12.0–15.0)
MCH: 24.6 pg — ABNORMAL LOW (ref 26.0–34.0)
MCHC: 30.5 g/dL (ref 30.0–36.0)
MCV: 80.6 fL (ref 80.0–100.0)
Platelets: 385 10*3/uL (ref 150–400)
RBC: 3.41 MIL/uL — ABNORMAL LOW (ref 3.87–5.11)
RDW: 18.8 % — ABNORMAL HIGH (ref 11.5–15.5)
WBC: 9.7 10*3/uL (ref 4.0–10.5)
nRBC: 0 % (ref 0.0–0.2)

## 2022-11-05 LAB — GLUCOSE, CAPILLARY
Glucose-Capillary: 101 mg/dL — ABNORMAL HIGH (ref 70–99)
Glucose-Capillary: 102 mg/dL — ABNORMAL HIGH (ref 70–99)
Glucose-Capillary: 102 mg/dL — ABNORMAL HIGH (ref 70–99)
Glucose-Capillary: 181 mg/dL — ABNORMAL HIGH (ref 70–99)
Glucose-Capillary: 84 mg/dL (ref 70–99)
Glucose-Capillary: 86 mg/dL (ref 70–99)
Glucose-Capillary: 87 mg/dL (ref 70–99)

## 2022-11-05 LAB — BPAM RBC
Blood Product Expiration Date: 202401192359
ISSUE DATE / TIME: 202312241414
Unit Type and Rh: 5100

## 2022-11-05 LAB — HEPARIN LEVEL (UNFRACTIONATED): Heparin Unfractionated: 0.27 IU/mL — ABNORMAL LOW (ref 0.30–0.70)

## 2022-11-05 LAB — HEMOGLOBIN
Hemoglobin: 9 g/dL — ABNORMAL LOW (ref 12.0–15.0)
Hemoglobin: 8.5 g/dL — ABNORMAL LOW (ref 12.0–15.0)

## 2022-11-05 LAB — MAGNESIUM: Magnesium: 1.9 mg/dL (ref 1.7–2.4)

## 2022-11-05 MED ORDER — VANCOMYCIN HCL 750 MG IV SOLR
750.0000 mg | INTRAVENOUS | Status: DC
  Administered 2022-11-05: 750 mg via INTRAVENOUS
  Filled 2022-11-05: qty 15

## 2022-11-05 MED ORDER — CHLORHEXIDINE GLUCONATE CLOTH 2 % EX PADS
6.0000 | MEDICATED_PAD | Freq: Every day | CUTANEOUS | Status: DC
  Administered 2022-11-07: 6 via TOPICAL

## 2022-11-05 MED ORDER — HEPARIN BOLUS VIA INFUSION
500.0000 [IU] | Freq: Once | INTRAVENOUS | Status: AC
  Administered 2022-11-05: 500 [IU] via INTRAVENOUS
  Filled 2022-11-05: qty 500

## 2022-11-05 MED ORDER — POTASSIUM CHLORIDE CRYS ER 20 MEQ PO TBCR
40.0000 meq | EXTENDED_RELEASE_TABLET | ORAL | Status: AC
  Administered 2022-11-05 (×2): 40 meq via ORAL
  Filled 2022-11-05 (×2): qty 2

## 2022-11-05 MED ORDER — POTASSIUM CHLORIDE 10 MEQ/100ML IV SOLN
10.0000 meq | INTRAVENOUS | Status: AC
  Administered 2022-11-05 (×2): 10 meq via INTRAVENOUS
  Filled 2022-11-05 (×2): qty 100

## 2022-11-05 MED ORDER — HEPARIN (PORCINE) 25000 UT/250ML-% IV SOLN
900.0000 [IU]/h | INTRAVENOUS | Status: DC
  Administered 2022-11-05: 850 [IU]/h via INTRAVENOUS
  Filled 2022-11-05: qty 250

## 2022-11-05 NOTE — Progress Notes (Addendum)
Progress Note   Patient: [name] TFT:[id number] DOB: [birth year] DOA: 11/01/2022     4 DOS: the patient was seen and examined on 11/05/2022   Brief hospital course: NATALEA SUTLIFF is a 81 y.o. female with medical history significant for hypertension,diabetes mellitus, hypothyroidism, gout, breast cancer (s/p radiation and mastectomy), PVD, iron deficiency anemia, dCHF, hospitalized in October 2023 with bilateral lower extremity cellulitis, wheelchair-bound at baseline and with a chronic left heel ulcer, right toe ulcer and sacral decubitus, managed by wound care and podiatry, who presents to the ED with altered mental status. She was  febrile to 102 at the facility and was sent in as a code sepsis.   With EMS O2 sat was 88% on room air requiring 3 L to maintain sats in the high 90s.  ED Tmax 100.4, pulse 106.  BP 149/91 and O2 sat in the high 90s on room air.  WBC 17,000 with lactic acid 4.9-5.  CT abdomen and pelvis showing left external iliac and femoral vein thrombus and chronic obstructive uropathy with mild hydro ureteral nephrosis Placed on IV heparin in the emergency room. Patient also had right acute decubitus ulcer with infection, left great toe ulceration.  Patient had a debridement of the right heel by podiatry on 12/22. Patient is also seen by vascular surgery, planning for left lower extremity thrombectomy after the infection is better controlled. Hemoglobin dropped down to 7.0 on 12/24, transfuse, heparin discontinued, requested vascular surgery to place IVC filter. Hemoglobin is better after transfusion, scheduled to have thrombectomy and IVC filter 12/26.  Discussed with son, restart heparin drip and a follow hemoglobin every 8 hours.     Assessment and Plan:  Severe sepsis ([place]) Right heel wound with infection. Chronic ulcer of great toe of left foot with fat layer exposed ([place]) Patient is status post debridement, currently no growth.  Blood cultures negative.    Wound culture still pending, continue current antibiotics.   Acute on chronic anemia. Thrombocytosis. Patient received 1 unit of PRBC, hemoglobin is better at 8.4.  No evidence of GI bleed at this time, however, continue Protonix 40 mg IV twice a day. Check hemoglobin every 8 hours, transfuse again if hemoglobin less than 7   Acute deep vein thrombosis (DVT) of femoral vein of left lower extremity [facility]) Vascular surgery is scheduling for thrombectomy and IVC filter tomorrow.  Discussed with the patient and his son, given the risk of PE from DVT, will start heparin drip again.  I believe the benefit of heparin still overweigh the risk of bleeding.  Will follow hemoglobin closely, if hemoglobin dropped again below 7, will transfuse again, will discontinue heparin drip at that time.   Acute metabolic encephalopathy No significant confusion today.     Hypokalemia Continue monitoring electrolytes. Replete potassium again.   Chronic diastolic CHF (congestive heart failure) (HCC) No exacerbation.   Acute on chronic urinary retention Patient was able to void, however, residual still was high at 1000 mL, Foley catheter anchored, Flomax started.     History of mastectomy, right History of breast cancer s/p radiation and mastectomy No acute issues   Hypertension Continue atenolol and lisinopril   Diabetes mellitus, type 2 (HCC) Sliding scale insulin coverage      Subjective:  Patient slept well last night, no agitation.  No significant confusion today.  Physical Exam: Vitals:   11/04/22 1955 11/05/22 0055 11/05/22 0358 11/05/22 0857  BP: (!) 131/59 131/61 (!) 124/59 130/60  Pulse: 68 67 67  72  Resp: '17 17 15 16  '$ Temp: 98.5 F (36.9 C) 98.6 F (37 C) (!) 97.5 F (36.4 C) 98.7 F (37.1 C)  TempSrc: Oral Oral Oral Oral  SpO2: 100% 98% 100% 100%  Weight:      Height:       General exam: Appears calm and comfortable  Respiratory system: Clear to auscultation. Respiratory  effort normal. Cardiovascular system: S1 & S2 heard, RRR. No JVD, murmurs, rubs, gallops or clicks. No pedal edema. Gastrointestinal system: Abdomen is nondistended, soft and nontender. No organomegaly or masses felt. Normal bowel sounds heard. Central nervous system: Alert and oriented x2. No focal neurological deficits. Extremities: Symmetric 5 x 5 power. Skin: No rashes, lesions or ulcers Psychiatry: Judgement and insight appear normal. Mood & affect appropriate.   Data Reviewed:  Lab results reviewed.  Family Communication: Son updated over the phone.  Disposition: Status is: Inpatient Remains inpatient appropriate because: Severity of disease, IV treatment, inpatient procedures.  Planned Discharge Destination:  TBD    Time spent: 50 minutes  Author: Sharen Hones, MD 11/05/2022 10:03 AM  For on call review [email or web address].

## 2022-11-05 NOTE — H&P (View-Only) (Signed)
3 Days Post-Op   Subjective/Chief Complaint: Remains confused. Drop in HgB yesterday- now stable at 8.4. Heparin gtt stopped. No overt signs of bleeding.   Objective: Vital signs in last 24 hours: Temp:  [97.5 F (36.4 C)-98.6 F (37 C)] 97.5 F (36.4 C) (12/25 0358) Pulse Rate:  [62-71] 67 (12/25 0358) Resp:  [15-17] 15 (12/25 0358) BP: (107-131)/(55-63) 124/59 (12/25 0358) SpO2:  [98 %-100 %] 100 % (12/25 0358) Last BM Date : 11/04/22  Intake/Output from previous day: 12/24 0701 - 12/25 0700 In: 2026.5 [P.O.:160; I.V.:904.5; Blood:562; IV Piggyback:400] Out: 1700 [Urine:1700] Intake/Output this shift: No intake/output data recorded.  General appearance: cooperative and no distress Extremities: LEFT lower extremity- warm, mild tenderness to touch, trace edema  Lab Results:  Recent Labs    11/04/22 1652 11/05/22 0648  WBC 10.6* 9.7  HGB 8.6* 8.4*  HCT 28.1* 27.5*  PLT 403* 385   BMET Recent Labs    11/05/22 0648  NA 141  K 2.7*  CL 110  CO2 22  GLUCOSE 95  BUN 17  CREATININE 0.75  CALCIUM 7.9*   PT/INR No results for input(s): "LABPROT", "INR" in the last 72 hours. ABG No results for input(s): "PHART", "HCO3" in the last 72 hours.  Invalid input(s): "PCO2", "PO2"  Studies/Results: ECHOCARDIOGRAM COMPLETE  Result Date: 11/03/2022    ECHOCARDIOGRAM REPORT   Patient Name:   [name] [facility] Date of Exam: 11/03/2022 Medical Rec #:  [id number]          Height:       59.0 in Accession #:    [id number]         Weight:       82.0 lb Date of Birth:  January 20, 1941          BSA:          1.263 m Patient Age:    81 years           BP:           123/73 mmHg Patient Gender: F                  HR:           76 bpm. Exam Location:  [facility] Procedure: 2D Echo, Color Doppler and Cardiac Doppler Indications:     Bacteremia  History:         Patient has prior history of Echocardiogram examinations, most                  recent 06/30/2018. CHF, PVD; Risk Factors:Hypertension and                   Diabetes. Hx of breast cancer s/p radiation therapy.  Sonographer:     Eartha Inch Referring Phys:  [phone] [place] Diagnosing Phys: Neoma Laming  Sonographer Comments: Technically difficult study due to poor echo windows. Image acquisition challenging due to patient body habitus, Image acquisition challenging due to uncooperative patient and Image acquisition challenging due to respiratory motion. IMPRESSIONS  1. Left ventricular ejection fraction, by estimation, is 55 to 60%. The left ventricle has normal function. The left ventricle has no regional wall motion abnormalities. There is moderate concentric left ventricular hypertrophy. Left ventricular diastolic parameters are consistent with Grade I diastolic dysfunction (impaired relaxation).  2. Right ventricular systolic function is normal. The right ventricular size is normal.  3. Left atrial size was severely dilated.  4. Right atrial size was moderately dilated.  5. The  mitral valve is normal in structure. Mild mitral valve regurgitation. No evidence of mitral stenosis.  6. The aortic valve is normal in structure. Aortic valve regurgitation is trivial. No aortic stenosis is present.  7. The inferior vena cava is normal in size with greater than 50% respiratory variability, suggesting right atrial pressure of 3 mmHg. FINDINGS  Left Ventricle: Left ventricular ejection fraction, by estimation, is 55 to 60%. The left ventricle has normal function. The left ventricle has no regional wall motion abnormalities. The left ventricular internal cavity size was normal in size. There is  moderate concentric left ventricular hypertrophy. Left ventricular diastolic parameters are consistent with Grade I diastolic dysfunction (impaired relaxation). Right Ventricle: The right ventricular size is normal. No increase in right ventricular wall thickness. Right ventricular systolic function is normal. Left Atrium: Left atrial size was severely dilated.  Right Atrium: Right atrial size was moderately dilated. Pericardium: There is no evidence of pericardial effusion. Mitral Valve: The mitral valve is normal in structure. Mild mitral valve regurgitation. No evidence of mitral valve stenosis. MV peak gradient, 8.3 mmHg. The mean mitral valve gradient is 4.0 mmHg. Tricuspid Valve: The tricuspid valve is normal in structure. Tricuspid valve regurgitation is mild . No evidence of tricuspid stenosis. Aortic Valve: The aortic valve is normal in structure. Aortic valve regurgitation is trivial. No aortic stenosis is present. Pulmonic Valve: The pulmonic valve was normal in structure. Pulmonic valve regurgitation is trivial. No evidence of pulmonic stenosis. Aorta: The aortic root is normal in size and structure. Venous: The inferior vena cava is normal in size with greater than 50% respiratory variability, suggesting right atrial pressure of 3 mmHg. IAS/Shunts: No atrial level shunt detected by color flow Doppler.  LEFT VENTRICLE PLAX 2D LVIDd:         3.90 cm Diastology LVIDs:         2.70 cm LV e' medial:    5.77 cm/s LV PW:         1.00 cm LV E/e' medial:  18.0 LV IVS:        0.80 cm LV e' lateral:   5.98 cm/s                        LV E/e' lateral: 17.4  RIGHT VENTRICLE             IVC RV S prime:     14.70 cm/s  IVC diam: 1.50 cm TAPSE (M-mode): 1.8 cm LEFT ATRIUM             Index        RIGHT ATRIUM          Index LA diam:        3.80 cm 3.01 cm/m   RA Area:     7.53 cm LA Vol (A2C):   52.8 ml 41.82 ml/m  RA Volume:   10.70 ml 8.47 ml/m LA Vol (A4C):   31.6 ml 25.03 ml/m LA Biplane Vol: 41.7 ml 33.03 ml/m  AORTIC VALVE LVOT Vmax:   124.00 cm/s LVOT Vmean:  86.100 cm/s LVOT VTI:    0.293 m  AORTA Ao Asc diam: 3.40 cm MITRAL VALVE MV Area (PHT): 2.32 cm     SHUNTS MV Peak grad:  8.3 mmHg     Systemic VTI: 0.29 m MV Mean grad:  4.0 mmHg MV Vmax:       1.44 m/s MV Vmean:      90.3 cm/s MV Decel Time:  327 msec MV E velocity: 104.00 cm/s MV A velocity: 149.00 cm/s  MV E/A ratio:  0.70 Shaukat Khan Electronically signed by Neoma Laming Signature Date/Time: 11/03/2022/2:07:40 PM    Final    US ARTERIAL LOWER EXTREMITY DUPLEX RIGHT(NON-ABI)  Result Date: 11/03/2022 CLINICAL DATA:  Right heel ulcer. History of hypertension, hyperlipidemia and diabetes. EXAM: RIGHT LOWER EXTREMITY ARTERIAL DUPLEX SCAN TECHNIQUE: Gray-scale sonography as well as color Doppler and duplex ultrasound was performed to evaluate the lower extremity arteries including the common, superficial and profunda femoral arteries, popliteal artery and calf arteries. COMPARISON:  None Available. FINDINGS: Right lower Extremity Inflow: Normal common femoral arterial waveforms and velocities. No evidence of inflow (aortoiliac) disease. Outflow: Normal profunda femoral, superficial femoral and popliteal arterial waveforms and velocities. No focal elevation of the PSV to suggest stenosis. Mild calcified plaque in the visualized SFA. Runoff: Normal posterior and anterior tibial arterial waveforms and velocities. Vessels are patent to the ankle. Posterior and anterior tibial arterial waveforms and velocities. Vessels are patent to the ankle. IMPRESSION: No evidence of significant arterial occlusive disease in the right lower extremity. Atherosclerosis in the superficial femoral artery. Electronically Signed   By: Aletta Edouard M.D.   On: 11/03/2022 12:13    Anti-infectives: Anti-infectives (From admission, onward)    Start     Dose/Rate Route Frequency Ordered Stop   11/03/22 2000  vancomycin (VANCOREADY) IVPB 750 mg/150 mL  Status:  Discontinued        750 mg 150 mL/hr over 60 Minutes Intravenous Every 48 hours 11/02/22 0201 11/02/22 0205   11/03/22 2000  vancomycin (VANCOREADY) IVPB 750 mg/150 mL        750 mg 150 mL/hr over 60 Minutes Intravenous Every 48 hours 11/02/22 0205 11/09/22 1959   11/02/22 0800  ceFEPIme (MAXIPIME) 2 g in sodium chloride 0.9 % 100 mL IVPB  Status:  Discontinued        2  g 200 mL/hr over 30 Minutes Intravenous Every 12 hours 11/02/22 0159 11/02/22 0205   11/02/22 0800  ceFEPIme (MAXIPIME) 2 g in sodium chloride 0.9 % 100 mL IVPB        2 g 200 mL/hr over 30 Minutes Intravenous Every 12 hours 11/02/22 0205 11/09/22 0759   11/02/22 0145  metroNIDAZOLE (FLAGYL) IVPB 500 mg        500 mg 100 mL/hr over 60 Minutes Intravenous Every 12 hours 11/02/22 0143 11/09/22 0144   11/01/22 2000  ceFEPIme (MAXIPIME) 2 g in sodium chloride 0.9 % 100 mL IVPB        2 g 200 mL/hr over 30 Minutes Intravenous  Once 11/01/22 1957 11/01/22 2040   11/01/22 2000  vancomycin (VANCOCIN) IVPB 1000 mg/200 mL premix        1,000 mg 200 mL/hr over 60 Minutes Intravenous  Once 11/01/22 1957 11/01/22 2206       Assessment/Plan: LEFT external Iliac and Common femoral Vein Thrombosis; Inability to anticoagulate- drop in HgB Sepsis resolving- will proceed with thrombectomy and placement of IVC filter tomorrow. Right foot ulcer- Arterial Duplex reviewed- Biphasic and triphasic to AT/PT with flow to the foot. Etiology possibly from pressure. Will monitor heel- possible angiogram in the future if no improvement-appears to have improvement after debridement.   LOS: 4 days    Evaristo Bury 11/05/2022

## 2022-11-05 NOTE — Progress Notes (Signed)
[place] for Heparin  Indication: DVT  Allergies  Allergen Reactions   Other Anaphylaxis    Anesthisia has been a health issue for her in the past.    Sulfa Antibiotics Rash    Patient Measurements: Height: '4\' 11"'$  (149.9 cm) Weight: 37.2 kg (82 lb) IBW/kg (Calculated) : 43.2 Heparin Dosing Weight: 37.2 kg   Vital Signs: Temp: 98.6 F (37 C) (12/25 1956) Temp Source: Oral (12/25 1956) BP: 94/51 (12/25 1956) Pulse Rate: 73 (12/25 1956)  Labs: Recent Labs    11/03/22 2101 11/04/22 0656 11/04/22 0656 11/04/22 1143 11/04/22 1652 11/05/22 0648 11/05/22 1420 11/05/22 2024  HGB  --  7.0*   < > 6.9* 8.6* 8.4* 9.0*  --   HCT  --  23.8*   < > 23.1* 28.1* 27.5*  --   --   PLT  --  406*   < > 420* 403* 385  --   --   HEPARINUNFRC 0.28* 0.47  --   --   --   --   --  0.27*  CREATININE  --   --   --   --   --  0.75  --   --    < > = values in this interval not displayed.    Estimated Creatinine Clearance: 33.5 mL/min (by C-G formula based on SCr of 0.75 mg/dL).  Medical History: Past Medical History:  Diagnosis Date   Arthritis    Arthritis    Breast cancer ([place]) 1992   right breast with lumpectomy and rad tx   Cancer of right breast ([place]) 04/16/2013   right breast with mastectomy   Diabetes mellitus without complication (HCC)    Gout    High cholesterol    Hyperlipidemia    Hypertension    Personal history of radiation therapy     Assessment: Pharmacy consulted to dose heparin for DVT treatment in this 81 year old female admitted with sepsis.  No prior anticoag noted. CrCl = 31.5 ml/min  Goal of Therapy:  Heparin level 0.3-0.7 units/ml Monitor platelets by anticoagulation protocol: Yes   Results:  Date Time HL Rate/Comment 12/22 0853 <0.1 Subtherapeutic/ 600>750 12/22 2204 0.15 Subtherapeutic/ restarted after procedure '@1827'$  (not at steady state) 12/23 0241 0.25 Subtherapeutic/ 750 > 800 12/23 2101 0.28 Subtherapeutic /  800 > 850 12/24   0656   0.47      Therapeutic X 1 @ 850 units/hr 12/25 2024 0.27 Subtherapeutic  *Heparin was stopped on 12/24 bc hemoglobin dropped below 7.0. Was given 1 unit PRBC. Due to concern from some internal bleeding from heparin, it was stopped. Hemoglobin improved to 8.4 today.     Plan:  Plan for thrombectomy tomorrow with Vascular. Bolus with heparin 500 unit x 1 dose Increase heparin infusion rate to 900 units/hr Repeat HL with AM labs Continue to monitor H&H and platelets daily while on heparin gtt. CBC daily   Timithy Arons Rodriguez-Guzman PharmD, BCPS 11/05/2022 9:30 PM

## 2022-11-05 NOTE — Progress Notes (Signed)
3 Days Post-Op   Subjective/Chief Complaint: Remains confused. Drop in HgB yesterday- now stable at 8.4. Heparin gtt stopped. No overt signs of bleeding.   Objective: Vital signs in last 24 hours: Temp:  [97.5 F (36.4 C)-98.6 F (37 C)] 97.5 F (36.4 C) (12/25 0358) Pulse Rate:  [62-71] 67 (12/25 0358) Resp:  [15-17] 15 (12/25 0358) BP: (107-131)/(55-63) 124/59 (12/25 0358) SpO2:  [98 %-100 %] 100 % (12/25 0358) Last BM Date : 11/04/22  Intake/Output from previous day: 12/24 0701 - 12/25 0700 In: 2026.5 [P.O.:160; I.V.:904.5; Blood:562; IV Piggyback:400] Out: 1700 [Urine:1700] Intake/Output this shift: No intake/output data recorded.  General appearance: cooperative and no distress Extremities: LEFT lower extremity- warm, mild tenderness to touch, trace edema  Lab Results:  Recent Labs    11/04/22 1652 11/05/22 0648  WBC 10.6* 9.7  HGB 8.6* 8.4*  HCT 28.1* 27.5*  PLT 403* 385   BMET Recent Labs    11/05/22 0648  NA 141  K 2.7*  CL 110  CO2 22  GLUCOSE 95  BUN 17  CREATININE 0.75  CALCIUM 7.9*   PT/INR No results for input(s): "LABPROT", "INR" in the last 72 hours. ABG No results for input(s): "PHART", "HCO3" in the last 72 hours.  Invalid input(s): "PCO2", "PO2"  Studies/Results: ECHOCARDIOGRAM COMPLETE  Result Date: 11/03/2022    ECHOCARDIOGRAM REPORT   Patient Name:   [name] [facility] Date of Exam: 11/03/2022 Medical Rec #:  [id number]          Height:       59.0 in Accession #:    [id number]         Weight:       82.0 lb Date of Birth:  11-12-1941          BSA:          1.263 m Patient Age:    81 years           BP:           123/73 mmHg Patient Gender: F                  HR:           76 bpm. Exam Location:  [facility] Procedure: 2D Echo, Color Doppler and Cardiac Doppler Indications:     Bacteremia  History:         Patient has prior history of Echocardiogram examinations, most                  recent 06/30/2018. CHF, PVD; Risk Factors:Hypertension and                   Diabetes. Hx of breast cancer s/p radiation therapy.  Sonographer:     Eartha Inch Referring Phys:  [phone] [place] Diagnosing Phys: Neoma Laming  Sonographer Comments: Technically difficult study due to poor echo windows. Image acquisition challenging due to patient body habitus, Image acquisition challenging due to uncooperative patient and Image acquisition challenging due to respiratory motion. IMPRESSIONS  1. Left ventricular ejection fraction, by estimation, is 55 to 60%. The left ventricle has normal function. The left ventricle has no regional wall motion abnormalities. There is moderate concentric left ventricular hypertrophy. Left ventricular diastolic parameters are consistent with Grade I diastolic dysfunction (impaired relaxation).  2. Right ventricular systolic function is normal. The right ventricular size is normal.  3. Left atrial size was severely dilated.  4. Right atrial size was moderately dilated.  5. The  mitral valve is normal in structure. Mild mitral valve regurgitation. No evidence of mitral stenosis.  6. The aortic valve is normal in structure. Aortic valve regurgitation is trivial. No aortic stenosis is present.  7. The inferior vena cava is normal in size with greater than 50% respiratory variability, suggesting right atrial pressure of 3 mmHg. FINDINGS  Left Ventricle: Left ventricular ejection fraction, by estimation, is 55 to 60%. The left ventricle has normal function. The left ventricle has no regional wall motion abnormalities. The left ventricular internal cavity size was normal in size. There is  moderate concentric left ventricular hypertrophy. Left ventricular diastolic parameters are consistent with Grade I diastolic dysfunction (impaired relaxation). Right Ventricle: The right ventricular size is normal. No increase in right ventricular wall thickness. Right ventricular systolic function is normal. Left Atrium: Left atrial size was severely dilated.  Right Atrium: Right atrial size was moderately dilated. Pericardium: There is no evidence of pericardial effusion. Mitral Valve: The mitral valve is normal in structure. Mild mitral valve regurgitation. No evidence of mitral valve stenosis. MV peak gradient, 8.3 mmHg. The mean mitral valve gradient is 4.0 mmHg. Tricuspid Valve: The tricuspid valve is normal in structure. Tricuspid valve regurgitation is mild . No evidence of tricuspid stenosis. Aortic Valve: The aortic valve is normal in structure. Aortic valve regurgitation is trivial. No aortic stenosis is present. Pulmonic Valve: The pulmonic valve was normal in structure. Pulmonic valve regurgitation is trivial. No evidence of pulmonic stenosis. Aorta: The aortic root is normal in size and structure. Venous: The inferior vena cava is normal in size with greater than 50% respiratory variability, suggesting right atrial pressure of 3 mmHg. IAS/Shunts: No atrial level shunt detected by color flow Doppler.  LEFT VENTRICLE PLAX 2D LVIDd:         3.90 cm Diastology LVIDs:         2.70 cm LV e' medial:    5.77 cm/s LV PW:         1.00 cm LV E/e' medial:  18.0 LV IVS:        0.80 cm LV e' lateral:   5.98 cm/s                        LV E/e' lateral: 17.4  RIGHT VENTRICLE             IVC RV S prime:     14.70 cm/s  IVC diam: 1.50 cm TAPSE (M-mode): 1.8 cm LEFT ATRIUM             Index        RIGHT ATRIUM          Index LA diam:        3.80 cm 3.01 cm/m   RA Area:     7.53 cm LA Vol (A2C):   52.8 ml 41.82 ml/m  RA Volume:   10.70 ml 8.47 ml/m LA Vol (A4C):   31.6 ml 25.03 ml/m LA Biplane Vol: 41.7 ml 33.03 ml/m  AORTIC VALVE LVOT Vmax:   124.00 cm/s LVOT Vmean:  86.100 cm/s LVOT VTI:    0.293 m  AORTA Ao Asc diam: 3.40 cm MITRAL VALVE MV Area (PHT): 2.32 cm     SHUNTS MV Peak grad:  8.3 mmHg     Systemic VTI: 0.29 m MV Mean grad:  4.0 mmHg MV Vmax:       1.44 m/s MV Vmean:      90.3 cm/s MV Decel Time:  327 msec MV E velocity: 104.00 cm/s MV A velocity: 149.00 cm/s  MV E/A ratio:  0.70 Shaukat Khan Electronically signed by Neoma Laming Signature Date/Time: 11/03/2022/2:07:40 PM    Final    US ARTERIAL LOWER EXTREMITY DUPLEX RIGHT(NON-ABI)  Result Date: 11/03/2022 CLINICAL DATA:  Right heel ulcer. History of hypertension, hyperlipidemia and diabetes. EXAM: RIGHT LOWER EXTREMITY ARTERIAL DUPLEX SCAN TECHNIQUE: Gray-scale sonography as well as color Doppler and duplex ultrasound was performed to evaluate the lower extremity arteries including the common, superficial and profunda femoral arteries, popliteal artery and calf arteries. COMPARISON:  None Available. FINDINGS: Right lower Extremity Inflow: Normal common femoral arterial waveforms and velocities. No evidence of inflow (aortoiliac) disease. Outflow: Normal profunda femoral, superficial femoral and popliteal arterial waveforms and velocities. No focal elevation of the PSV to suggest stenosis. Mild calcified plaque in the visualized SFA. Runoff: Normal posterior and anterior tibial arterial waveforms and velocities. Vessels are patent to the ankle. Posterior and anterior tibial arterial waveforms and velocities. Vessels are patent to the ankle. IMPRESSION: No evidence of significant arterial occlusive disease in the right lower extremity. Atherosclerosis in the superficial femoral artery. Electronically Signed   By: Aletta Edouard M.D.   On: 11/03/2022 12:13    Anti-infectives: Anti-infectives (From admission, onward)    Start     Dose/Rate Route Frequency Ordered Stop   11/03/22 2000  vancomycin (VANCOREADY) IVPB 750 mg/150 mL  Status:  Discontinued        750 mg 150 mL/hr over 60 Minutes Intravenous Every 48 hours 11/02/22 0201 11/02/22 0205   11/03/22 2000  vancomycin (VANCOREADY) IVPB 750 mg/150 mL        750 mg 150 mL/hr over 60 Minutes Intravenous Every 48 hours 11/02/22 0205 11/09/22 1959   11/02/22 0800  ceFEPIme (MAXIPIME) 2 g in sodium chloride 0.9 % 100 mL IVPB  Status:  Discontinued        2  g 200 mL/hr over 30 Minutes Intravenous Every 12 hours 11/02/22 0159 11/02/22 0205   11/02/22 0800  ceFEPIme (MAXIPIME) 2 g in sodium chloride 0.9 % 100 mL IVPB        2 g 200 mL/hr over 30 Minutes Intravenous Every 12 hours 11/02/22 0205 11/09/22 0759   11/02/22 0145  metroNIDAZOLE (FLAGYL) IVPB 500 mg        500 mg 100 mL/hr over 60 Minutes Intravenous Every 12 hours 11/02/22 0143 11/09/22 0144   11/01/22 2000  ceFEPIme (MAXIPIME) 2 g in sodium chloride 0.9 % 100 mL IVPB        2 g 200 mL/hr over 30 Minutes Intravenous  Once 11/01/22 1957 11/01/22 2040   11/01/22 2000  vancomycin (VANCOCIN) IVPB 1000 mg/200 mL premix        1,000 mg 200 mL/hr over 60 Minutes Intravenous  Once 11/01/22 1957 11/01/22 2206       Assessment/Plan: LEFT external Iliac and Common femoral Vein Thrombosis; Inability to anticoagulate- drop in HgB Sepsis resolving- will proceed with thrombectomy and placement of IVC filter tomorrow. Right foot ulcer- Arterial Duplex reviewed- Biphasic and triphasic to AT/PT with flow to the foot. Etiology possibly from pressure. Will monitor heel- possible angiogram in the future if no improvement-appears to have improvement after debridement.   LOS: 4 days    Evaristo Bury 11/05/2022

## 2022-11-05 NOTE — Progress Notes (Signed)
[place] for Heparin  Indication: DVT  Allergies  Allergen Reactions   Other Anaphylaxis    Anesthisia has been a health issue for her in the past.    Sulfa Antibiotics Rash    Patient Measurements: Height: '4\' 11"'$  (149.9 cm) Weight: 37.2 kg (82 lb) IBW/kg (Calculated) : 43.2 Heparin Dosing Weight: 37.2 kg   Vital Signs: Temp: 98.7 F (37.1 C) (12/25 0857) Temp Source: Oral (12/25 0857) BP: 130/60 (12/25 0857) Pulse Rate: 72 (12/25 0857)  Labs: Recent Labs    11/03/22 1315 11/03/22 2101 11/04/22 0656 11/04/22 0656 11/04/22 1143 11/04/22 1652 11/05/22 0648  HGB  --   --  7.0*   < > 6.9* 8.6* 8.4*  HCT  --   --  23.8*   < > 23.1* 28.1* 27.5*  PLT  --   --  406*   < > 420* 403* 385  HEPARINUNFRC 0.38 0.28* 0.47  --   --   --   --   CREATININE  --   --   --   --   --   --  0.75   < > = values in this interval not displayed.    Estimated Creatinine Clearance: 33.5 mL/min (by C-G formula based on SCr of 0.75 mg/dL).  Medical History: Past Medical History:  Diagnosis Date   Arthritis    Arthritis    Breast cancer ([place]) 1992   right breast with lumpectomy and rad tx   Cancer of right breast ([place]) 04/16/2013   right breast with mastectomy   Diabetes mellitus without complication (HCC)    Gout    High cholesterol    Hyperlipidemia    Hypertension    Personal history of radiation therapy     Assessment: Pharmacy consulted to dose heparin for DVT treatment in this 81 year old female admitted with sepsis.  No prior anticoag noted. CrCl = 31.5 ml/min  Goal of Therapy:  Heparin level 0.3-0.7 units/ml Monitor platelets by anticoagulation protocol: Yes   Results: 12/22@ 0853  HL < 0.10, subtherapeutic @ 600 un/hr > 750 un/hr  12/22 @ 2204 HL = 0.15, SUBtherapeutic  -  eMar says heparin gtt restarted after procedure @ 8469 so this level may not be valid.   12/23 @ 0241 HL = 0.25, SUBtherapeutic    Date Time HL Rate/Comment 12/22 0853 <0.1 Subtherapeutic/ 600>750 12/22 2204 0.15 Subtherapeutic/ restarted after procedure '@1827'$  (not at steady state) 12/23 0241 0.25 Subtherapeutic/ 750 > 800 12/23 2101 0.28 Subtherapeutic / 800 > 850 12/24   0656   0.47      Therapeutic X 1 @ 850 units/hr  *Heparin was stopped on 12/24 bc hemoglobin dropped below 7.0. Was given 1 unit PRBC. Due to concern from some internal bleeding from heparin, it was stopped. Hemoglobin improved to 8.4 today.     Plan:  - Will resume heparin infusion at previous rate of 850 units/hr without bolus. Plan for thrombectomy tomorrow with Vascular. - Will check HL in 8 hours and daily will on heparin drip - Continue to monitor H&H and platelets daily while on heparin gtt. - CBC daily   Pearla Dubonnet 11/05/2022 12:30 PM

## 2022-11-06 ENCOUNTER — Encounter
Admission: EM | Disposition: A | Discharge status: Nursing Home (Includes ICF) | Payer: Self-pay | Source: Skilled Nursing Facility | Attending: Internal Medicine

## 2022-11-06 ENCOUNTER — Encounter: Payer: Self-pay | Admitting: Vascular Surgery

## 2022-11-06 DIAGNOSIS — A419 Sepsis, unspecified organism: Secondary | ICD-10-CM | POA: Diagnosis not present

## 2022-11-06 DIAGNOSIS — G9341 Metabolic encephalopathy: Secondary | ICD-10-CM | POA: Diagnosis not present

## 2022-11-06 DIAGNOSIS — I82422 Acute embolism and thrombosis of left iliac vein: Secondary | ICD-10-CM | POA: Diagnosis not present

## 2022-11-06 DIAGNOSIS — I871 Compression of vein: Secondary | ICD-10-CM | POA: Diagnosis not present

## 2022-11-06 DIAGNOSIS — I82412 Acute embolism and thrombosis of left femoral vein: Secondary | ICD-10-CM | POA: Diagnosis not present

## 2022-11-06 DIAGNOSIS — D649 Anemia, unspecified: Secondary | ICD-10-CM | POA: Diagnosis not present

## 2022-11-06 DIAGNOSIS — R6520 Severe sepsis without septic shock: Secondary | ICD-10-CM | POA: Diagnosis not present

## 2022-11-06 HISTORY — PX: PERIPHERAL VASCULAR THROMBECTOMY: CATH118306

## 2022-11-06 HISTORY — PX: IVC FILTER INSERTION: CATH118245

## 2022-11-06 LAB — BASIC METABOLIC PANEL
Anion gap: 9 (ref 5–15)
BUN: 16 mg/dL (ref 8–23)
CO2: 21 mmol/L — ABNORMAL LOW (ref 22–32)
Calcium: 7.8 mg/dL — ABNORMAL LOW (ref 8.9–10.3)
Chloride: 111 mmol/L (ref 98–111)
Creatinine, Ser: 0.8 mg/dL (ref 0.44–1.00)
GFR, Estimated: >60 mL/min (ref >60)
Glucose, Bld: 119 mg/dL — ABNORMAL HIGH (ref 70–99)
Potassium: 3.6 mmol/L (ref 3.5–5.1)
Sodium: 141 mmol/L (ref 135–145)

## 2022-11-06 LAB — GLUCOSE, CAPILLARY
Glucose-Capillary: 130 mg/dL — ABNORMAL HIGH (ref 70–99)
Glucose-Capillary: 110 mg/dL — ABNORMAL HIGH (ref 70–99)
Glucose-Capillary: 115 mg/dL — ABNORMAL HIGH (ref 70–99)
Glucose-Capillary: 139 mg/dL — ABNORMAL HIGH (ref 70–99)
Glucose-Capillary: 138 mg/dL — ABNORMAL HIGH (ref 70–99)
Glucose-Capillary: 158 mg/dL — ABNORMAL HIGH (ref 70–99)

## 2022-11-06 LAB — CBC
HCT: 28 % — ABNORMAL LOW (ref 36.0–46.0)
Hemoglobin: 8.5 g/dL — ABNORMAL LOW (ref 12.0–15.0)
MCH: 25.1 pg — ABNORMAL LOW (ref 26.0–34.0)
MCHC: 30.4 g/dL (ref 30.0–36.0)
MCV: 82.8 fL (ref 80.0–100.0)
Platelets: 372 10*3/uL (ref 150–400)
RBC: 3.38 MIL/uL — ABNORMAL LOW (ref 3.87–5.11)
RDW: 19.5 % — ABNORMAL HIGH (ref 11.5–15.5)
WBC: 11.9 10*3/uL — ABNORMAL HIGH (ref 4.0–10.5)
nRBC: 0 % (ref 0.0–0.2)

## 2022-11-06 LAB — CULTURE, BLOOD (ROUTINE X 2)
Culture: NO GROWTH
Culture: NO GROWTH
Special Requests: ADEQUATE
Special Requests: ADEQUATE

## 2022-11-06 LAB — HEPARIN LEVEL (UNFRACTIONATED): Heparin Unfractionated: 0.42 IU/mL (ref 0.30–0.70)

## 2022-11-06 LAB — CREATININE, SERUM
Creatinine, Ser: 0.77 mg/dL (ref 0.44–1.00)
GFR, Estimated: >60 mL/min (ref >60)

## 2022-11-06 LAB — MAGNESIUM: Magnesium: 1.7 mg/dL (ref 1.7–2.4)

## 2022-11-06 LAB — HEMOGLOBIN: Hemoglobin: 8.2 g/dL — ABNORMAL LOW (ref 12.0–15.0)

## 2022-11-06 SURGERY — IVC FILTER INSERTION
Anesthesia: Moderate Sedation

## 2022-11-06 MED ORDER — MIDAZOLAM HCL 2 MG/2ML IJ SOLN
INTRAMUSCULAR | Status: AC
  Filled 2022-11-06: qty 4

## 2022-11-06 MED ORDER — MIDAZOLAM HCL 2 MG/2ML IJ SOLN
INTRAMUSCULAR | Status: DC | PRN
  Administered 2022-11-06 (×2): .5 mg via INTRAVENOUS
  Administered 2022-11-06: 1 mg via INTRAVENOUS

## 2022-11-06 MED ORDER — SODIUM CHLORIDE 0.9 % IV SOLN: INTRAVENOUS | Status: DC

## 2022-11-06 MED ORDER — DOXYCYCLINE HYCLATE 100 MG PO TABS
100.0000 mg | ORAL_TABLET | Freq: Two times a day (BID) | ORAL | Status: DC
  Administered 2022-11-06 – 2022-11-09 (×7): 100 mg via ORAL
  Filled 2022-11-06 (×7): qty 1

## 2022-11-06 MED ORDER — MEDIHONEY WOUND/BURN DRESSING EX PSTE
1.0000 | PASTE | Freq: Every day | CUTANEOUS | Status: DC
  Administered 2022-11-06 – 2022-11-08 (×3): 1 via TOPICAL
  Filled 2022-11-06: qty 44

## 2022-11-06 MED ORDER — ENOXAPARIN SODIUM 30 MG/0.3ML IJ SOSY
30.0000 mg | PREFILLED_SYRINGE | INTRAMUSCULAR | Status: DC
  Administered 2022-11-06 – 2022-11-08 (×3): 30 mg via SUBCUTANEOUS
  Filled 2022-11-06 (×2): qty 0.3

## 2022-11-06 MED ORDER — HEPARIN SODIUM (PORCINE) 1000 UNIT/ML IJ SOLN
INTRAMUSCULAR | Status: DC | PRN
  Administered 2022-11-06: 3000 [IU] via INTRAVENOUS

## 2022-11-06 MED ORDER — FENTANYL CITRATE PF 50 MCG/ML IJ SOSY
PREFILLED_SYRINGE | INTRAMUSCULAR | Status: AC
  Filled 2022-11-06: qty 2

## 2022-11-06 MED ORDER — HEPARIN SODIUM (PORCINE) 1000 UNIT/ML IJ SOLN
INTRAMUSCULAR | Status: AC
  Filled 2022-11-06: qty 10

## 2022-11-06 MED ORDER — FENTANYL CITRATE (PF) 100 MCG/2ML IJ SOLN
INTRAMUSCULAR | Status: DC | PRN
  Administered 2022-11-06: 50 ug via INTRAVENOUS

## 2022-11-06 SURGICAL SUPPLY — 14 items
CANISTER PENUMBRA ENGINE (MISCELLANEOUS) IMPLANT
CATH INDIGO 12XTORQ 100 (CATHETERS) IMPLANT
CLOSURE PERCLOSE PROSTYLE (VASCULAR PRODUCTS) IMPLANT
COVER PROBE ULTRASOUND 5X96 (MISCELLANEOUS) IMPLANT
KIT MICROPUNCTURE NIT STIFF (SHEATH) IMPLANT
KIT POP OPTION ELITE FILTER (Filter) IMPLANT
NDL ENTRY 21GA 7CM ECHOTIP (NEEDLE) IMPLANT
NEEDLE ENTRY 21GA 7CM ECHOTIP (NEEDLE) ×2 IMPLANT
PACK ANGIOGRAPHY (CUSTOM PROCEDURE TRAY) ×2 IMPLANT
SHEATH BRITE TIP 6FRX11 (SHEATH) IMPLANT
SHEATH PINNACLE 11FRX10 (SHEATH) IMPLANT
SUT MNCRL AB 4-0 PS2 18 (SUTURE) IMPLANT
WIRE GUIDERIGHT .035X150 (WIRE) IMPLANT
WIRE SUPRACORE 300CM (MISCELLANEOUS) IMPLANT

## 2022-11-06 NOTE — Progress Notes (Signed)
Progress Note   Patient: [name] UKG:[id number] DOB: 03-10-1941 DOA: 11/01/2022     5 DOS: the patient was seen and examined on 11/06/2022   Brief hospital course: [name] is a 81 y.o. female with medical history significant for hypertension,diabetes mellitus, hypothyroidism, gout, breast cancer (s/p radiation and mastectomy), PVD, iron deficiency anemia, dCHF, hospitalized in October 2023 with bilateral lower extremity cellulitis, wheelchair-bound at baseline and with a chronic left heel ulcer, right toe ulcer and sacral decubitus, managed by wound care and podiatry, who presents to the ED with altered mental status. She was  febrile to 102 at the facility and was sent in as a code sepsis.   With EMS O2 sat was 88% on room air requiring 3 L to maintain sats in the high 90s.  ED Tmax 100.4, pulse 106.  BP 149/91 and O2 sat in the high 90s on room air.  WBC 17,000 with lactic acid 4.9-5.  CT abdomen and pelvis showing left external iliac and femoral vein thrombus and chronic obstructive uropathy with mild hydro ureteral nephrosis Placed on IV heparin in the emergency room. Patient also had right acute decubitus ulcer with infection, left great toe ulceration.  Patient had a debridement of the right heel by podiatry on 12/22. Patient is also seen by vascular surgery, planning for left lower extremity thrombectomy after the infection is better controlled. 12/24 Hemoglobin dropped down to 7.0 on 12/24, transfuse, heparin discontinued, requested vascular surgery to place IVC filter. 12/25 Hemoglobin is better after transfusion, scheduled to have thrombectomy and IVC filter 12/26.  Discussed with son, restart heparin drip and a follow hemoglobin every 8 hours. Patient had a lower extremity thrombectomy and IVC filter 12/26, discontinue anticoagulation.     Assessment and Plan: Severe sepsis ([name]) Right heel wound with infection. Chronic ulcer of great toe of left foot with fat  layer exposed ([place]) Patient is status post debridement, currently no growth.  Blood cultures negative.   Wound culture still has no growth x4 days.  Antibiotic changed to doxycycline.   Acute on chronic anemia. Thrombocytosis. Patient received 1 unit of PRBC, hemoglobin is better at 8.4.  No evidence of GI bleed at this time, however, continue Protonix 40 mg IV twice a day. Hemoglobin still stable today, continue to follow.  Given no evidence of black stool.   Acute deep vein thrombosis (DVT) of femoral vein of left lower extremity [facility]) Patient had lower extremity thrombectomy and IVC filter placed today.  Due to concern of acute bleeding, I will discontinue heparin as patient no longer has risk for PE with recurrent IVC filter in place.   Acute metabolic encephalopathy improved     Hypokalemia Normalized   Chronic diastolic CHF (congestive heart failure) (HCC) No exacerbation.   Acute on chronic urinary retention Patient was able to void, however, residual still was high at 1000 mL, Foley catheter anchored, Flomax started.     History of mastectomy, right History of breast cancer s/p radiation and mastectomy No acute issues   Hypertension Continue atenolol and lisinopril   Diabetes mellitus, type 2 (HCC) Sliding scale insulin coverage      Subjective:  Patient doing well today, no new complaints.  Physical Exam: Vitals:   11/06/22 1033 11/06/22 1045 11/06/22 1100 11/06/22 1115  BP: (!) 108/53 (!) 112/56 107/64 (!) 114/57  Pulse: 80 79 76 76  Resp: '20 19 18 17  '$ Temp:      TempSrc:  SpO2: 97% 98% 98% 99%  Weight:      Height:       General exam: Appears calm and comfortable  Respiratory system: Clear to auscultation. Respiratory effort normal. Cardiovascular system: S1 & S2 heard, RRR. No JVD, murmurs, rubs, gallops or clicks. No pedal edema. Gastrointestinal system: Abdomen is nondistended, soft and nontender. No organomegaly or masses felt. Normal bowel  sounds heard. Central nervous system: Alert and oriented x2. No focal neurological deficits. Extremities: Symmetric 5 x 5 power. Skin: No rashes, lesions or ulcers Psychiatry: Judgement and insight appear normal. Mood & affect appropriate.   Data Reviewed:  Lab results reviewed.  Family Communication: son updated  Disposition: Status is: Inpatient Remains inpatient appropriate because: severity of disease  Planned Discharge Destination:  TBD    Time spent: 35 minutes  Author: Sharen Hones, MD 11/06/2022 1:01 PM  For on call review [email or web address].

## 2022-11-06 NOTE — Consult Note (Signed)
[place] Nurse Consult Note: Reason for Consult: Consult requested for sacrum.  Pt has a chronic Stage 4 pressure injury to the sacrum; bone palpable when probed with a swab. 100% red and moist, small amt pink drainage, 2.5X2.5X1cm Right hip with Unstageable pressure injury; 100% black eschar; 1X2.5cm, no odor, drainage, or fluctuance.  Pressure Injury POA: Yes Topical treatment orders provided for bedside nurses to perform as follows to assist with removal of nonviable tissue: 1. Apply Medihoney to right hip Q day, then cover with foam dressing.  (Change foam dressing Q 3 days or PRN soiling. ) 2. Cut piece of Aquacel Kellie Simmering # [phone]) and tuck into sacrum wound Q day, using swab to fill, then cover with foam dressing.  (Change foam dressing Q 3 days or PRN soiling.) Please re-consult if further assistance is needed.  Thank-you,  Julien Girt MSN, [place], [place], [place], [place]

## 2022-11-06 NOTE — Progress Notes (Signed)
Pharmacy Antibiotic Note  [name] is a 81 y.o. female admitted on 11/01/2022 with sepsis. PMH significant for HTN, T2DM, hypothyroidism, gout, breast cancer (s/p radiation and mastectomy), PVD, IDA, dCHF, chronic left heel ulcer, right toe ulcer and sacral decubitus. Patient was hospitalized in October 2023 with BLE cellulitis. She received IV antibiotics (Zosyn, Cefepime, Vancomycin). 12/22 Xray of R heel suspicious for osteomyelitis. Pharmacy has been consulted for Vancomycin, Cefepime dosing.  Plan: Continue Vancomycin 750 mg IV Q48H. Goal AUC 400-550. Expected AUC: 434.9 Expected Css min: 7.8 SCr used: 0.8 (actual 0.69)  Weight used: TBW (IBW > TBW), Vd used: 0.72 (BMI 16.5) Plan to order Vancomycin level if Abx to continue for > 5 days Continue Cefepime 2 g IV Q12H Patient is also on Metronidazole 500 mg IV Q12H Continue to monitor renal function and follow culture results and surgical pathology   Height: '4\' 11"'$  (149.9 cm) Weight: 37.2 kg (82 lb) IBW/kg (Calculated) : 43.2  Temp (24hrs), Avg:98.6 F (37 C), Min:98.3 F (36.8 C), Max:98.9 F (37.2 C)  Recent Labs  Lab 11/01/22 1947 11/01/22 2209 11/02/22 0501 11/02/22 0853 11/02/22 1125 11/04/22 0656 11/04/22 1143 11/04/22 1652 11/05/22 0648 11/06/22 0532  WBC 17.3*  --  15.1*  --   --  12.7* 13.1* 10.6* 9.7  --   CREATININE 0.85  --  0.69  --   --   --   --   --  0.75 0.80  LATICACIDVEN 4.9* 5.0*  --  1.6 1.7  --   --   --   --   --      Estimated Creatinine Clearance: 33.5 mL/min (by C-G formula based on SCr of 0.8 mg/dL).    Allergies  Allergen Reactions   Other Anaphylaxis    Anesthisia has been a health issue for her in the past.    Sulfa Antibiotics Rash    Antimicrobials this admission: 12/21 Vancomycin >>  12/21 Cefepime >>  12/22 Metronidazole >>  Microbiology results: 12/21 BCx: NG x 5D 12/21 UCx: NG final 12/21 Wound Cx: NG x 3D 12/21 MRSA PCR: negative 12/22 Surg Path (R heel): IP    Thank you for allowing pharmacy to be a part of this patient's care.  Ash Mcelwain Rodriguez-Guzman PharmD, BCPS 11/06/2022 7:39 AM

## 2022-11-06 NOTE — Interval H&P Note (Signed)
History and Physical Interval Note:  11/06/2022 9:18 AM  [name]  has presented today for surgery, with the diagnosis of Left External Iliac Vein/ CFV Thrombus.  The various methods of treatment have been discussed with the patient and family. After consideration of risks, benefits and other options for treatment, the patient has consented to  Procedure(s): IVC FILTER INSERTION (N/A) PERIPHERAL VASCULAR THROMBECTOMY- EXTERNAL Iliac Vein/CFV (Left) as a surgical intervention.  The patient's history has been reviewed, patient examined, no change in status, stable for surgery.  I have reviewed the patient's chart and labs.  Questions were answered to the patient's satisfaction.     [name]

## 2022-11-06 NOTE — Op Note (Signed)
[facility]   OPERATIVE NOTE   PRE-OPERATIVE DIAGNOSIS: extensive ileofemoral left leg DVT, anemia  POST-OPERATIVE DIAGNOSIS: same   PROCEDURE: 1.   US guidance for vascular access to left popliteal vein 2.   Catheter placement into left external iliac vein from left popliteal approach 3.   IVC gram and left lower extremity venogram 4.   Mechanical thrombectomy to left superficial femoral vein, common femoral vein, and iliac veins with the penumbra CAT 12 device 5.   IVC filter placement with an option Elite IVC filter    SURGEON: [name], MD  ASSISTANT(S): none  ANESTHESIA: local with moderate conscious sedation for 26 minutes using 2 mg of Versed and 50 mcg of Fentanyl  ESTIMATED BLOOD LOSS: 200 cc  FINDING(S): 1.  DVT in the proximal left superficial femoral vein, common femoral vein, iliac veins.  IVC was patent.  SPECIMEN(S):  none  INDICATIONS:    Patient is a 81 y.o. female who presents with left iliofemoral DVT and anemia.  Patient has marked leg swelling and pain.  Venous intervention is performed to reduce the symtpoms and avoid long term postphlebitic symptoms.    DESCRIPTION: After obtaining full informed written consent, the patient was brought back to the vascular suite and placed supine upon the table. Moderate conscious sedation was administered during a face to face encounter with the patient throughout the procedure with my supervision of the RN administering medicines and monitoring the patient's vital signs, pulse oximetry, telemetry and mental status throughout from the start of the procedure until the patient was taken to the recovery room.  After obtaining adequate anesthesia, the patient was prepped and draped in the standard fashion.    The patient was then placed into the prone position.  The left popliteal vein was then accessed under direct ultrasound guidance without difficulty with a micropuncture needle and a permanent image  was recorded.  I then upsized to a 6 French sheath and placed a ProGlide device in a preclose fashion.  I then upsized to an 11 Fr sheath over a J wire.  3000 units of heparin were then given.  Imaging through the left popliteal sheath showed DVT in the proximal left superficial femoral vein, nearly occlusive thrombus in the left common femoral vein, and thrombus extending into the left external iliac vein.  The IVC was patent.  I was able to cross the thrombus and stenosis and advance into the IVC which was patent.  I then used the Penumbra Cat 12 catheter and evacuated about 200 cc of effluent with mechanical thrombectomy throughout the iliac veins, CFV, and proximal SFV. This had marked improvement with only a small amount of residual thrombus in the proximal superficial femoral and common femoral veins and clearance of the iliac veins.  And her anemia and the uncertain status of being able to continue anticoagulation, we have been requested to place an IVC filter as well which was very reasonable.  The option Elite IVC filter was then brought on the field.  Imaging showed the renal veins at the level of L1.  The filter was then deployed at L2 just below the renal veins.  The delivery system was then removed.  I then elected to terminate the procedure.  The sheath was removed, the Pro-glide device was secured and hemostasis was achieved.  A 4-0 Monocryl was placed at the skin entrance site and a dressing was placed.  She was taken to the recovery room in stable condition  having tolerated the procedure well.    COMPLICATIONS: None  CONDITION: Stable  [name] 11/06/2022 10:28 AM

## 2022-11-06 NOTE — Progress Notes (Signed)
Pt drinking water and asking about procedure

## 2022-11-06 NOTE — Care Management Important Message (Signed)
Important Message  Patient Details  Name: [name] MRN: [id number] Date of Birth: [birth year]   Medicare Important Message Given:  Yes     Dannette Barbara 11/06/2022, 12:13 PM

## 2022-11-06 NOTE — Progress Notes (Signed)
Report given to [employer], RN, pt eating PB crackers, CBG 115

## 2022-11-06 NOTE — Progress Notes (Signed)
PHARMACIST - PHYSICIAN COMMUNICATION  CONCERNING:  Enoxaparin (Lovenox) for DVT Prophylaxis    RECOMMENDATION: Patient was prescribed enoxaprin '40mg'$  q24 hours for VTE prophylaxis.   Filed Weights   11/01/22 1954  Weight: 37.2 kg (82 lb)    Body mass index is 16.56 kg/m.  Estimated Creatinine Clearance: 33.5 mL/min (by C-G formula based on SCr of 0.8 mg/dL).   Patient is candidate for enoxaparin '30mg'$  every 24 hours based on CrCl <19m/min or Weight <45kg  DESCRIPTION: [facility]  Patient is now receiving enoxaparin 30 mg every 24 hours    JAlison Murray PharmD Clinical Pharmacist  11/06/2022 1:09 PM

## 2022-11-06 NOTE — Progress Notes (Signed)
[place] for Heparin  Indication: DVT  Allergies  Allergen Reactions   Other Anaphylaxis    Anesthisia has been a health issue for her in the past.    Sulfa Antibiotics Rash    Patient Measurements: Height: '4\' 11"'$  (149.9 cm) Weight: 37.2 kg (82 lb) IBW/kg (Calculated) : 43.2 Heparin Dosing Weight: 37.2 kg   Vital Signs: Temp: 98.9 F (37.2 C) (12/26 0454) Temp Source: Oral (12/26 0454) BP: 108/57 (12/26 0454) Pulse Rate: 71 (12/26 0454)  Labs: Recent Labs    11/04/22 0656 11/04/22 1143 11/04/22 1652 11/05/22 0648 11/05/22 1420 11/05/22 2024 11/05/22 2323 11/06/22 0532  HGB 7.0* 6.9* 8.6* 8.4*   < >  --  8.5* 8.2*  HCT 23.8* 23.1* 28.1* 27.5*  --   --   --   --   PLT 406* 420* 403* 385  --   --   --   --   HEPARINUNFRC 0.47  --   --   --   --  0.27*  --  0.42  CREATININE  --   --   --  0.75  --   --   --  0.80   < > = values in this interval not displayed.    Estimated Creatinine Clearance: 33.5 mL/min (by C-G formula based on SCr of 0.8 mg/dL).  Medical History: Past Medical History:  Diagnosis Date   Arthritis    Arthritis    Breast cancer ([place]) 1992   right breast with lumpectomy and rad tx   Cancer of right breast ([place]) 04/16/2013   right breast with mastectomy   Diabetes mellitus without complication (HCC)    Gout    High cholesterol    Hyperlipidemia    Hypertension    Personal history of radiation therapy     Assessment: Pharmacy consulted to dose heparin for DVT treatment in this 81 year old female admitted with sepsis.  No prior anticoag noted. CrCl = 31.5 ml/min  Goal of Therapy:  Heparin level 0.3-0.7 units/ml Monitor platelets by anticoagulation protocol: Yes   Results:  Date Time HL Rate/Comment 12/22 0853 <0.1 Subtherapeutic/ 600>750 12/22 2204 0.15 Subtherapeutic/ restarted after procedure '@1827'$  (not at steady state) 12/23 0241 0.25 Subtherapeutic/ 750 > 800 12/23 2101 0.28 Subtherapeutic /  800 > 850 12/24   0656   0.47      Therapeutic X 1 @ 850 units/hr 12/25 2024 0.27 Subtherapeutic 12/26 0532 0.42 Therapeutic  *Heparin was stopped on 12/24 bc hemoglobin dropped below 7.0. Was given 1 unit PRBC. Due to concern from some internal bleeding from heparin, it was stopped. Hemoglobin improved to 8.4 today.     Plan:  Plan for thrombectomy tomorrow with Vascular. Continue heparin infusion at 900 units/hr Repeat HL in 8 hrs to confirm therapeutic rate Continue to monitor H&H and platelets daily while on heparin gtt. CBC daily   Kloee Ballew Rodriguez-Guzman PharmD, BCPS 11/06/2022 7:29 AM

## 2022-11-07 DIAGNOSIS — A419 Sepsis, unspecified organism: Secondary | ICD-10-CM | POA: Diagnosis not present

## 2022-11-07 DIAGNOSIS — G9341 Metabolic encephalopathy: Secondary | ICD-10-CM | POA: Diagnosis not present

## 2022-11-07 DIAGNOSIS — I82412 Acute embolism and thrombosis of left femoral vein: Secondary | ICD-10-CM | POA: Diagnosis not present

## 2022-11-07 DIAGNOSIS — I5032 Chronic diastolic (congestive) heart failure: Secondary | ICD-10-CM

## 2022-11-07 LAB — BASIC METABOLIC PANEL
Anion gap: 4 — ABNORMAL LOW (ref 5–15)
BUN: 16 mg/dL (ref 8–23)
CO2: 23 mmol/L (ref 22–32)
Calcium: 7.7 mg/dL — ABNORMAL LOW (ref 8.9–10.3)
Chloride: 114 mmol/L — ABNORMAL HIGH (ref 98–111)
Creatinine, Ser: 0.79 mg/dL (ref 0.44–1.00)
GFR, Estimated: >60 mL/min (ref >60)
Glucose, Bld: 123 mg/dL — ABNORMAL HIGH (ref 70–99)
Potassium: 3.8 mmol/L (ref 3.5–5.1)
Sodium: 141 mmol/L (ref 135–145)

## 2022-11-07 LAB — CBC
HCT: 27.7 % — ABNORMAL LOW (ref 36.0–46.0)
Hemoglobin: 8.3 g/dL — ABNORMAL LOW (ref 12.0–15.0)
MCH: 25 pg — ABNORMAL LOW (ref 26.0–34.0)
MCHC: 30 g/dL (ref 30.0–36.0)
MCV: 83.4 fL (ref 80.0–100.0)
Platelets: 330 10*3/uL (ref 150–400)
RBC: 3.32 MIL/uL — ABNORMAL LOW (ref 3.87–5.11)
RDW: 19.9 % — ABNORMAL HIGH (ref 11.5–15.5)
WBC: 11.2 10*3/uL — ABNORMAL HIGH (ref 4.0–10.5)
nRBC: 0 % (ref 0.0–0.2)

## 2022-11-07 LAB — AEROBIC/ANAEROBIC CULTURE W GRAM STAIN (SURGICAL/DEEP WOUND)
Culture: NO GROWTH
Gram Stain: NONE SEEN

## 2022-11-07 LAB — GLUCOSE, CAPILLARY
Glucose-Capillary: 103 mg/dL — ABNORMAL HIGH (ref 70–99)
Glucose-Capillary: 109 mg/dL — ABNORMAL HIGH (ref 70–99)
Glucose-Capillary: 112 mg/dL — ABNORMAL HIGH (ref 70–99)
Glucose-Capillary: 121 mg/dL — ABNORMAL HIGH (ref 70–99)
Glucose-Capillary: 145 mg/dL — ABNORMAL HIGH (ref 70–99)
Glucose-Capillary: 146 mg/dL — ABNORMAL HIGH (ref 70–99)

## 2022-11-07 LAB — MAGNESIUM: Magnesium: 1.7 mg/dL (ref 1.7–2.4)

## 2022-11-07 LAB — SURGICAL PATHOLOGY

## 2022-11-07 LAB — HOMOCYSTEINE: Homocysteine: 14.7 umol/L (ref 0.0–19.2)

## 2022-11-07 MED ORDER — INSULIN ASPART 100 UNIT/ML IJ SOLN
0.0000 [IU] | Freq: Three times a day (TID) | INTRAMUSCULAR | Status: DC
  Administered 2022-11-08 (×2): 1 [IU] via SUBCUTANEOUS
  Administered 2022-11-09: 2 [IU] via SUBCUTANEOUS
  Filled 2022-11-07 (×3): qty 1

## 2022-11-07 NOTE — TOC Initial Note (Addendum)
Transition of Care [facility]) - Initial/Assessment Note    Patient Details  Name: [name] MRN: [id number] Date of Birth: 1941-10-17  [facility] CM/SW Contact:    Gerilyn Pilgrim, LCSW Phone Number: 11/07/2022, 4:06 PM  Clinical Narrative:    SW spoke with husband regarding SNF recommendations. Husband states he and his wife are both from ALF. Husband states he would like her to discharge back to the assisted living. He states they have a hospital bed, BSC and wheelchair. Pt reports they have private duty aid 3 times a week for 5-6 hours a day. Pt prior [facility] and would like to use again. SW will send [facility] a message. Per MD, Pt to discharge tomorrow. Message sent to corey with [facility].     4:18pm [facility] accepted referral.         Expected Discharge Plan: [place] Barriers to Discharge: Continued Medical Work up   Patient Goals and CMS Choice Patient states their goals for this hospitalization and ongoing recovery are:: return home   Choice offered to / list presented to : Spouse      Expected Discharge Plan and Services       Living arrangements for the past 2 months: Assisted Living Facility                           HH Arranged: PT, RN, OT          Prior Living Arrangements/Services Living arrangements for the past 2 months: [place] Lives with:: Self Patient language and need for interpreter reviewed:: Yes Do you feel safe going back to the place where you live?: Yes      Need for Family Participation in Patient Care: Yes (Comment) Care giver support system in place?: Yes (comment) Current home services: DME, Home RN, Home OT, Home PT, Homehealth aide Criminal Activity/Legal Involvement Pertinent to Current Situation/Hospitalization: No - Comment as needed  Activities of Daily Living Home Assistive Devices/Equipment: Walker (specify type) ADL Screening (condition at time of  admission) Patient's cognitive ability adequate to safely complete daily activities?: No Is the patient deaf or have difficulty hearing?: No Does the patient have difficulty seeing, even when wearing glasses/contacts?: No Does the patient have difficulty concentrating, remembering, or making decisions?: Yes Patient able to express need for assistance with ADLs?: No Does the patient have difficulty dressing or bathing?: Yes Independently performs ADLs?: No Communication: Independent Dressing (OT): Dependent Is this a change from baseline?: Change from baseline, expected to last <3days Grooming: Dependent Is this a change from baseline?: Change from baseline, expected to last <3 days Feeding: Dependent Is this a change from baseline?: Change from baseline, expected to last <3 days Bathing: Dependent Is this a change from baseline?: Change from baseline, expected to last <3 days Toileting: Dependent Is this a change from baseline?: Change from baseline, expected to last <3 days In/Out Bed: Dependent Is this a change from baseline?: Change from baseline, expected to last <3 days Walks in Home: Dependent Is this a change from baseline?: Change from baseline, expected to last <3 days Does the patient have difficulty walking or climbing stairs?: Yes Weakness of Legs: Both Weakness of Arms/Hands: Both  Permission Sought/Granted      Share Information with NAME: BRITANEY, ESPAILLAT (Spouse) [phone]           Emotional Assessment  Admission diagnosis:  Thrombus [I82.90] Sepsis ([place]) [A41.9] Fever, unspecified fever cause [R50.9] Altered mental status, unspecified altered mental status type [R41.82] Patient Active Problem List   Diagnosis Date Noted   Reactive thrombocytosis 11/04/2022   Acute deep vein thrombosis (DVT) of femoral vein of left lower extremity ([place]) 11/02/2022   Chronic ulcer of great toe of left foot with fat layer exposed ([place]) 11/02/2022    Acute on chronic anemia 11/02/2022   Acute on chronic urinary retention 11/02/2022   Thrombus 11/02/2022   Pressure injury of skin 34/28/7681   Acute metabolic encephalopathy 15/72/6203   Severe sepsis ([place]) 08/22/2022   Acute diverticulitis 08/22/2022   Hydroureteronephrosis 08/22/2022   Hypokalemia 08/22/2022   Malnutrition of moderate degree 08/18/2022   Cellulitis of lower extremity 08/17/2022   Cellulitis of both lower extremities 08/16/2022   Diabetes mellitus without complication (HCC)    Gout    Chronic diastolic CHF (congestive heart failure) ([place])    Iron deficiency anemia    Diarrhea    Cervical spondylosis 03/09/2020   Lower limb ulcer, ankle, right, limited to breakdown of skin ([place]) 09/09/2018   Chronic gouty arthritis 04/29/2018   Generalized osteoarthritis of hand 02/12/2018   Hyperuricemia 02/12/2018   Swelling of finger 02/12/2018   PAD (peripheral artery disease) ([place]) 04/30/2017   Lymphedema 04/30/2017   Bilateral edema of lower extremity 04/03/2017   Ecchymosis 04/03/2017   Leg pain, bilateral 04/03/2017   Acquired hypothyroidism 01/24/2017   Benign essential hypertension 01/24/2017   History of mastectomy, right 01/24/2017   Primary osteoarthritis of left knee 02/29/2016   Cancer of right breast ([place]) 04/17/2015   Chronic pain 03/03/2015   Chronic pain of both knees 03/03/2015   Arthritis 01/11/2014   Soft tissue lesion of shoulder region 01/11/2014   Diabetes mellitus, type 2 ([place]) 01/11/2014   H/O renal calculi 01/11/2014   Hypertension 01/11/2014   HLD (hyperlipidemia) 01/11/2014   Neuropathy 01/11/2014   OP (osteoporosis) 01/11/2014   H/O malignant neoplasm of breast 01/11/2014   PCP:  Tracie Harrier, MD Pharmacy:   [place], [place] - [address] [place] [address] Phone: [phone] Fax: [place], [place] - [place] [facility] [address] AT [place] [address] [facility] [place] [address] Phone: [phone] Fax: [phone]  [id number] [address], [place] AT [facility] [address] [place] [address] Phone: [phone] Fax: [phone]     Social Determinants of Health (SDOH) Social History: SDOH Screenings   Food Insecurity: No Food Insecurity (11/02/2022)  Housing: Low Risk  (11/02/2022)  Transportation Needs: No Transportation Needs (11/02/2022)  Utilities: Not At Risk (11/02/2022)  Depression (PHQ2-9): Low Risk  (01/29/2019)  Tobacco Use: Low Risk  (11/06/2022)   SDOH Interventions:     Readmission Risk Interventions    11/07/2022    4:05 PM 08/24/2022   10:41 AM  Readmission Risk Prevention Plan  Transportation Screening Complete Complete  PCP or Specialist Appt within 3-5 Days  Complete  HRI or [place]  Not Complete  HRI or Home Care Consult comments  SW will arrange this admission  Social Work Consult for [place] Planning/Counseling  Complete  Palliative Care Screening  Not Applicable  Medication Review Press photographer) Complete Complete  Skilled Nursing Facility Complete

## 2022-11-07 NOTE — Evaluation (Signed)
Physical Therapy Evaluation Patient Details Name: [name] MRN: [id number] DOB: [birth year] Today's Date: 11/07/2022  History of Present Illness  81 y.o. female with medical history significant for HTN,diabetes mellitus, hypothyroidism, gout, breast cancer (s/p radiation and R mastectomy), PVD, iron deficiency anemia, dCHF, hospitalized in October 2023 with bilateral lower extremity cellulitis, wheelchair-bound at baseline and with a chronic left heel ulcer, right toe ulcer and sacral decubitus, managed by wound care and podiatry, who presents to the ED with altered mental status d/t sepsis. Pt is now s/p debridement of R heel and LLE thrombectomy with IVC filter placement.  Clinical Impression  The pt presents this session well below her baseline level of function. At last d/c the pt required Mod I for transfer and was independent with w/c mobility. At this time the pt requires Max A+2 for bed mobility and lateral scooting at the EOB. At this time the patient will benefit from ongoing skilled PT services in skilled nursing facility setting to continue to advance safe functional mobility, address ongoing impairments in bed mobility, transfers, and w/c propulsion.       Recommendations for follow up therapy are one component of a multi-disciplinary discharge planning process, led by the attending physician.  Recommendations may be updated based on patient status, additional functional criteria and insurance authorization.  Follow Up Recommendations Skilled nursing-short term rehab (<3 hours/day) Can patient physically be transported by private vehicle: No    Assistance Recommended at Discharge Frequent or constant Supervision/Assistance  Patient can return home with the following  Two people to help with walking and/or transfers;Two people to help with bathing/dressing/bathroom;Direct supervision/assist for medications management;Direct supervision/assist for financial management;Help with  stairs or ramp for entrance;Assist for transportation    Equipment Recommendations    Recommendations for Other Services       Functional Status Assessment Patient has had a recent decline in their functional status and demonstrates the ability to make significant improvements in function in a reasonable and predictable amount of time.     Precautions / Restrictions Precautions Precautions: Fall Restrictions Weight Bearing Restrictions: No      Mobility  Bed Mobility Overal bed mobility: Needs Assistance Bed Mobility: Supine to Sit, Sit to Supine     Supine to sit: Max assist, +2 for physical assistance, HOB elevated Sit to supine: Max assist, +2 for physical assistance   General bed mobility comments: step by step cues    Transfers Overall transfer level: Needs assistance                Lateral/Scoot Transfers: Max assist, +2 physical assistance General transfer comment: scoot along EOB    Ambulation/Gait                  Stairs            Wheelchair Mobility    Modified Rankin (Stroke Patients Only)       Balance Overall balance assessment: Needs assistance Sitting-balance support: Bilateral upper extremity supported, Feet supported Sitting balance-Leahy Scale: Fair                                       Pertinent Vitals/Pain Pain Assessment Pain Assessment: Faces Faces Pain Scale: Hurts even more Breathing: normal Negative Vocalization: none Facial Expression: smiling or inexpressive Body Language: relaxed Consolability: no need to console PAINAD Score: 0 Pain Location: R heel and B knees with extension  Home Living Family/patient expects to be discharged to:: Assisted living                 Home Equipment: Wheelchair - manual Additional Comments: reports living with spouse who can assist minimally    Prior Function Prior Level of Function : Needs assist       Physical Assist : ADLs  (physical);Mobility (physical)     Mobility Comments: poor historian, limits ADLs Comments: Takes seated sponge bath, generally able to get dressed and toilet herself. Spouse assists with IADL, Pt denies falls in past 52mo    Hand Dominance        Extremity/Trunk Assessment   Upper Extremity Assessment Upper Extremity Assessment: LUE deficits/detail LUE Deficits / Details: Limited shoulder flexion, requires passive assistance.    Lower Extremity Assessment Lower Extremity Assessment: RLE deficits/detail;LLE deficits/detail RLE Deficits / Details: Unable to achieve full extension with passive ROM LLE Deficits / Details: Unable to achieve full extension with passive ROM       Communication   Communication: HOH  Cognition Arousal/Alertness: Awake/alert Behavior During Therapy: WFL for tasks assessed/performed Overall Cognitive Status: No family/caregiver present to determine baseline cognitive functioning                                          General Comments      Exercises     Assessment/Plan    PT Assessment Patient needs continued PT services  PT Problem List Decreased strength;Decreased mobility;Decreased safety awareness;Decreased range of motion;Decreased activity tolerance;Decreased balance       PT Treatment Interventions Therapeutic exercise;Therapeutic activities;Functional mobility training;Wheelchair mobility training;Patient/family education    PT Goals (Current goals can be found in the Care Plan section)  Acute Rehab PT Goals Patient Stated Goal: Return to her husband Time For Goal Achievement: 11/21/22 Potential to Achieve Goals: Fair    Frequency Min 2X/week     Co-evaluation               AM-PAC PT "6 Clicks" Mobility  Outcome Measure Help needed turning from your back to your side while in a flat bed without using bedrails?: A Lot Help needed moving from lying on your back to sitting on the side of a flat bed  without using bedrails?: A Lot Help needed moving to and from a bed to a chair (including a wheelchair)?: A Lot Help needed standing up from a chair using your arms (e.g., wheelchair or bedside chair)?: A Lot Help needed to walk in hospital room?: Total Help needed climbing 3-5 steps with a railing? : Total 6 Click Score: 10    End of Session   Activity Tolerance: Patient tolerated treatment well Patient left: in bed;with bed alarm set Nurse Communication: Mobility status PT Visit Diagnosis: Difficulty in walking, not elsewhere classified (R26.2);Muscle weakness (generalized) (M62.81)    Time: [address]PT Time Calculation (min) (ACUTE ONLY): 32 min   Charges:   PT Evaluation $PT Eval Moderate Complexity: 1 Mod PT Treatments $Therapeutic Activity: 8-22 mins        1:15 PM, 11/07/22 Camille Thau A. WSaverio DankerPT, DPT Physical Therapist - [facility]  Makaila Windle A Faruq Rosenberger 11/07/2022, 1:13 PM

## 2022-11-07 NOTE — Evaluation (Signed)
Occupational Therapy Evaluation Patient Details Name: [name] MRN: [id number] DOB: Apr 17, 1941 Today's Date: 11/07/2022   History of Present Illness 81 y.o. female with medical history significant for HTN,diabetes mellitus, hypothyroidism, gout, breast cancer (s/p radiation and R mastectomy), PVD, iron deficiency anemia, dCHF, hospitalized in October 2023 with bilateral lower extremity cellulitis, wheelchair-bound at baseline and with a chronic left heel ulcer, right toe ulcer and sacral decubitus, managed by wound care and podiatry, who presents to the ED with altered mental status d/t sepsis. Pt is now s/p debridement of R heel and LLE thrombectomy with IVC filter placement.   Clinical Impression   [name] was seen for OT evaluation this date. Pt is poor historian, reports from [facility] ALF using w/c for mobility and no assist for ADLs. Pt presents to acute OT demonstrating impaired ADL performance and functional mobility 2/2 decreased activity tolerance and functional strength/ROM/balance deficits. Pt currently requires MAX A don/doff B prevalon boots at bed level, MAX A x2 sup<>sit. Fair static sitting balance, MAX A don sock seated EOB. Difficulty weight shifting anteriorly for lateral scoot t/f, ultimately required MAX A x2 to scoot along EOB. Pt would benefit from skilled OT to address noted impairments and functional limitations (see below for any additional details). Upon hospital discharge, recommend STR to maximize pt safety and return to PLOF.    Recommendations for follow up therapy are one component of a multi-disciplinary discharge planning process, led by the attending physician.  Recommendations may be updated based on patient status, additional functional criteria and insurance authorization.   Follow Up Recommendations  Skilled nursing-short term rehab (<3 hours/day)     Assistance Recommended at Discharge Frequent or constant Supervision/Assistance  Patient  can return home with the following Two people to help with walking and/or transfers;Two people to help with bathing/dressing/bathroom;Help with stairs or ramp for entrance    Functional Status Assessment  Patient has had a recent decline in their functional status and demonstrates the ability to make significant improvements in function in a reasonable and predictable amount of time.  Equipment Recommendations  BSC/3in1;Hospital bed    Recommendations for Other Services       Precautions / Restrictions Precautions Precautions: Fall Restrictions Weight Bearing Restrictions: No      Mobility Bed Mobility Overal bed mobility: Needs Assistance Bed Mobility: Supine to Sit, Sit to Supine     Supine to sit: Max assist, +2 for physical assistance, HOB elevated Sit to supine: Max assist, +2 for physical assistance   General bed mobility comments: step by step cues    Transfers Overall transfer level: Needs assistance   Transfers: Bed to chair/wheelchair/BSC            Lateral/Scoot Transfers: Max assist, +2 physical assistance General transfer comment: scoot along EOB      Balance Overall balance assessment: Needs assistance Sitting-balance support: Bilateral upper extremity supported, Feet supported Sitting balance-Leahy Scale: Fair                                     ADL either performed or assessed with clinical judgement   ADL Overall ADL's : Needs assistance/impaired                                       General ADL Comments: MAX A don/doff B prevalon  boots at bed level, MAX A don sock seated EOB.      Pertinent Vitals/Pain Pain Assessment Pain Assessment: Faces Faces Pain Scale: Hurts even more Pain Location: R heel and B knees with extension Pain Descriptors / Indicators: Dull, Discomfort, Grimacing Pain Intervention(s): Limited activity within patient's tolerance, Repositioned     Hand Dominance     Extremity/Trunk  Assessment Upper Extremity Assessment Upper Extremity Assessment: Generalized weakness   Lower Extremity Assessment Lower Extremity Assessment: Generalized weakness       Communication Communication Communication: HOH   Cognition Arousal/Alertness: Awake/alert Behavior During Therapy: WFL for tasks assessed/performed, Anxious Overall Cognitive Status: No family/caregiver present to determine baseline cognitive functioning Area of Impairment: Orientation, Following commands, Problem solving, Safety/judgement                 Orientation Level: Disoriented to, Time, Place     Following Commands: Follows one step commands with increased time Safety/Judgement: Decreased awareness of safety, Decreased awareness of deficits   Problem Solving: Slow processing, Decreased initiation, Difficulty sequencing        Home Living Family/patient expects to be discharged to:: Assisted living                             Home Equipment: Wheelchair - manual   Additional Comments: reports living with spouse who can assist minimally      Prior Functioning/Environment Prior Level of Function : Needs assist             Mobility Comments: poor historian, limits          OT Problem List: Decreased strength;Decreased range of motion;Decreased activity tolerance;Impaired balance (sitting and/or standing);Decreased safety awareness      OT Treatment/Interventions: Self-care/ADL training;Therapeutic exercise;Energy conservation;DME and/or AE instruction;Therapeutic activities;Patient/family education;Balance training    OT Goals(Current goals can be found in the care plan section) Acute Rehab OT Goals Patient Stated Goal: to go home OT Goal Formulation: With patient Time For Goal Achievement: 11/21/22 Potential to Achieve Goals: Fair ADL Goals Pt Will Perform Grooming: with set-up;with supervision;sitting Pt Will Perform Lower Body Dressing: sitting/lateral leans;with  mod assist Pt Will Transfer to Toilet: with mod assist;with transfer board;bedside commode  OT Frequency: Min 2X/week    Co-evaluation              AM-PAC OT "6 Clicks" Daily Activity     Outcome Measure Help from another person eating meals?: None Help from another person taking care of personal grooming?: A Little Help from another person toileting, which includes using toliet, bedpan, or urinal?: A Lot Help from another person bathing (including washing, rinsing, drying)?: A Lot Help from another person to put on and taking off regular upper body clothing?: A Lot Help from another person to put on and taking off regular lower body clothing?: A Lot 6 Click Score: 15   End of Session    Activity Tolerance: Patient tolerated treatment well Patient left: in bed;with call bell/phone within reach;with bed alarm set  OT Visit Diagnosis: Muscle weakness (generalized) (M62.81);Other abnormalities of gait and mobility (R26.89)                Time: 2595-6387 OT Time Calculation (min): 31 min Charges:  OT General Charges $OT Visit: 1 Visit OT Evaluation $OT Eval Moderate Complexity: 1 Mod OT Treatments $Self Care/Home Management : 8-22 mins  Dessie Coma, M.S. OTR/L  11/07/22, 11:45 AM  ascom [phone]

## 2022-11-07 NOTE — Consult Note (Signed)
   [facility] CM Inpatient Consult   11/07/2022  AMBRIELLA KITT 04-25-1941 [id number]  [facility] [ACO] Patient:  *[facility] Liaison remote coverage review for patient admitted to [facility]   Primary Care Provider:  Tracie Harrier, MD, [facility], [place]   Patient screened for hospitalization with noted  extreme high risk score for unplanned readmission risk to assess for potential [place] Management service needs for post hospital transition for care coordination.  Review of patient's electronic medical record reveals patient is from [facility] and is being recommended for a skilled nursing facility level of care for transition. Plan:  Continue to follow progress and disposition to assess for post hospital community care coordination/management needs.  Patient's post hospital transition of care needs is recommended for SNF level of care.  For questions contact:   Natividad Brood, RN BSN [place]  [phone] business mobile phone Toll free office [phone]  *[place]  [phone] Fax number: [phone] [email or web address]

## 2022-11-07 NOTE — Progress Notes (Signed)
Progress Note   Patient: [name] [id number]:[id number] DOB: 06-22-1941 DOA: 11/01/2022     6 DOS: the patient was seen and examined on 11/07/2022   Brief hospital course: NAKEETA SEBASTIANI is a 81 y.o. female with medical history significant for hypertension,diabetes mellitus, hypothyroidism, gout, breast cancer (s/p radiation and mastectomy), PVD, iron deficiency anemia, dCHF, hospitalized in October 2023 with bilateral lower extremity cellulitis, wheelchair-bound at baseline and with a chronic left heel ulcer, right toe ulcer and sacral decubitus, managed by wound care and podiatry, who presents to the ED with altered mental status. She was  febrile to 102 at the facility and was sent in as a code sepsis.   With EMS O2 sat was 88% on room air requiring 3 L to maintain sats in the high 90s.  ED Tmax 100.4, pulse 106.  BP 149/91 and O2 sat in the high 90s on room air.  WBC 17,000 with lactic acid 4.9-5.  CT abdomen and pelvis showing left external iliac and femoral vein thrombus and chronic obstructive uropathy with mild hydro ureteral nephrosis Placed on IV heparin in the emergency room. Patient also had right acute decubitus ulcer with infection, left great toe ulceration.  Patient had a debridement of the right heel by podiatry on 12/22. Patient is also seen by vascular surgery, planning for left lower extremity thrombectomy after the infection is better controlled. 12/24 Hemoglobin dropped down to 7.0 on 12/24, transfuse, heparin discontinued, requested vascular surgery to place IVC filter. 12/25 Hemoglobin is better after transfusion, scheduled to have thrombectomy and IVC filter 12/26.  Discussed with son, restart heparin drip and a follow hemoglobin every 8 hours. Patient had a lower extremity thrombectomy and IVC filter 12/26, discontinue anticoagulation.     Assessment and Plan: Severe sepsis ([place]) Right heel wound with infection. Chronic ulcer of great toe of left foot with fat  layer exposed ([place]) Patient is status post debridement, currently no growth.  Blood cultures negative.   Wound culture finalized without growth.  Bone biopsy did not show evidence of osteomyelitis.  Antibiotic changed to doxycycline.   Acute on chronic anemia. Thrombocytosis. Patient received 1 unit of PRBC, hemoglobin is better at 8.4.  No evidence of GI bleed at this time, however, continue Protonix 40 mg IV twice a day. Hb has been stable, heparin discontinued.  Discontinue Protonix.  Recheck CBC tomorrow.   Acute deep vein thrombosis (DVT) of femoral vein of left lower extremity ([place]) Patient had lower extremity thrombectomy and IVC filter placed.  Due to concern of acute bleeding, I will discontinue heparin as patient no longer has risk for PE with current IVC filter in place.   Acute metabolic encephalopathy improved     Hypokalemia Normalized   Chronic diastolic CHF (congestive heart failure) (HCC) No exacerbation.   Acute on chronic urinary retention Patient was able to void, however, residual still was high at 1000 mL, Foley catheter anchored, Flomax started.     History of mastectomy, right History of breast cancer s/p radiation and mastectomy No acute issues   Hypertension Continue atenolol and lisinopril   Diabetes mellitus, type 2 (HCC) Sliding scale insulin coverage      Subjective:  Patient doing well today, no complaints.  Physical Exam: Vitals:   11/07/22 0043 11/07/22 0444 11/07/22 0738 11/07/22 1059  BP: 118/71 116/65 (!) 142/72 131/71  Pulse: 72 69 80 86  Resp: '12 14 17 17  '$ Temp: 98.2 F (36.8 C) 97.7 F (36.5 C) 98 F (  36.7 C) 97.7 F (36.5 C)  TempSrc: Oral Oral    SpO2: 100% 100% 97% 100%  Weight:      Height:       General exam: Appears calm and comfortable  Respiratory system: Clear to auscultation. Respiratory effort normal. Cardiovascular system: S1 & S2 heard, RRR. No JVD, murmurs, rubs, gallops or clicks. No pedal  edema. Gastrointestinal system: Abdomen is nondistended, soft and nontender. No organomegaly or masses felt. Normal bowel sounds heard. Central nervous system: Alert and oriented. No focal neurological deficits. Extremities: Symmetric 5 x 5 power. Skin: No rashes, lesions or ulcers Psychiatry: Judgement and insight appear normal. Mood & affect appropriate.   Data Reviewed:  Lab results reviewed.  Family Communication: husband updated  Disposition: Status is: Inpatient Remains inpatient appropriate because: unsafe discharge  Planned Discharge Destination: Skilled nursing facility    Time spent: 35 minutes  Author: Sharen Hones, MD 11/07/2022 3:14 PM  For on call review [email or web address].

## 2022-11-08 DIAGNOSIS — A419 Sepsis, unspecified organism: Secondary | ICD-10-CM | POA: Diagnosis not present

## 2022-11-08 DIAGNOSIS — R6520 Severe sepsis without septic shock: Secondary | ICD-10-CM | POA: Diagnosis not present

## 2022-11-08 DIAGNOSIS — I82412 Acute embolism and thrombosis of left femoral vein: Secondary | ICD-10-CM | POA: Diagnosis not present

## 2022-11-08 DIAGNOSIS — R339 Retention of urine, unspecified: Secondary | ICD-10-CM | POA: Diagnosis not present

## 2022-11-08 LAB — CBC
HCT: 24.6 % — ABNORMAL LOW (ref 36.0–46.0)
Hemoglobin: 7.4 g/dL — ABNORMAL LOW (ref 12.0–15.0)
MCH: 24.8 pg — ABNORMAL LOW (ref 26.0–34.0)
MCHC: 30.1 g/dL (ref 30.0–36.0)
MCV: 82.6 fL (ref 80.0–100.0)
Platelets: 320 10*3/uL (ref 150–400)
RBC: 2.98 MIL/uL — ABNORMAL LOW (ref 3.87–5.11)
RDW: 19.3 % — ABNORMAL HIGH (ref 11.5–15.5)
WBC: 11.5 10*3/uL — ABNORMAL HIGH (ref 4.0–10.5)
nRBC: 0 % (ref 0.0–0.2)

## 2022-11-08 LAB — HEMOGLOBIN: Hemoglobin: 7.6 g/dL — ABNORMAL LOW (ref 12.0–15.0)

## 2022-11-08 LAB — GLUCOSE, CAPILLARY
Glucose-Capillary: 119 mg/dL — ABNORMAL HIGH (ref 70–99)
Glucose-Capillary: 123 mg/dL — ABNORMAL HIGH (ref 70–99)
Glucose-Capillary: 132 mg/dL — ABNORMAL HIGH (ref 70–99)
Glucose-Capillary: 166 mg/dL — ABNORMAL HIGH (ref 70–99)

## 2022-11-08 MED ORDER — ZINC SULFATE 220 (50 ZN) MG PO CAPS
220.0000 mg | ORAL_CAPSULE | Freq: Every day | ORAL | Status: DC
  Administered 2022-11-08 – 2022-11-09 (×2): 220 mg via ORAL
  Filled 2022-11-08 (×2): qty 1

## 2022-11-08 MED ORDER — VITAMIN C 500 MG PO TABS
500.0000 mg | ORAL_TABLET | Freq: Two times a day (BID) | ORAL | Status: DC
  Administered 2022-11-08 – 2022-11-09 (×2): 500 mg via ORAL
  Filled 2022-11-08 (×2): qty 1

## 2022-11-08 NOTE — Progress Notes (Signed)
Nutrition Follow-up  DOCUMENTATION CODES:   Non-severe (moderate) malnutrition in context of chronic illness  INTERVENTION:   -D/c Ensure Enlive po BID, each supplement provides 350 kcal and 20 grams of protein.  -D/c Juven -Continue MVI with minerals daily -Magic cup TID with meals, each supplement provides 290 kcal and 9 grams of protein  -500 mg vitamin C BID -220 mg zinc sulfate daily x 14 days  NUTRITION DIAGNOSIS:   Moderate Malnutrition related to chronic illness (DM) as evidenced by mild fat depletion, moderate fat depletion, mild muscle depletion, moderate muscle depletion.  Ongoing  GOAL:   Patient will meet greater than or equal to 90% of their needs  Progressing   MONITOR:   PO intake, Supplement acceptance  REASON FOR ASSESSMENT:   Consult Assessment of nutrition requirement/status  ASSESSMENT:   81 y.o. female admits related to AMS. PMH includes: arthritis, breast cancer, DM, HLD, HTN. Pt is currently receiving medical management related to severe sepsis with right heel wound with infection.  12/26- s/p IVC filter placement  Reviewed I/O's: -1 L x 24 hours and +2.4 L since admission  UOP: 1.3 L   Pt sitting up in bed at time of visit. She did not respond to voice or touch. Noted pt consumed about 85% of lunch tray. Meal completions documented at 50-80%.   Pt drinking very little; noted pt consumed about 25% of tea and Ensure.   Per TOC notes, plan to discharge back to ALF with home health services.   Lab Results  Component Value Date   HGBA1C 5.7 (H) 08/16/2022   PTA DM medications are 50 mg januvia daily and 1000 mg metformin BID.   Labs reviewed: CBGS: 119-132 (inpatient orders for glycemic control are 0-9 units insulin aspart TID with meals).    NUTRITION - FOCUSED PHYSICAL EXAM:  Flowsheet Row Most Recent Value  Orbital Region No depletion  Upper Arm Region Moderate depletion  Thoracic and Lumbar Region No depletion  Buccal Region  Mild depletion  Temple Region Mild depletion  Clavicle Bone Region Mild depletion  Clavicle and Acromion Bone Region Mild depletion  Scapular Bone Region Mild depletion  Dorsal Hand Moderate depletion  Patellar Region Mild depletion  Anterior Thigh Region Mild depletion  Posterior Calf Region Mild depletion  Edema (RD Assessment) None  Hair Reviewed  Eyes Reviewed  Mouth Reviewed  Skin Reviewed  Nails Reviewed       Diet Order:   Diet Order             Diet regular Room service appropriate? Yes; Fluid consistency: Thin  Diet effective now                   EDUCATION NEEDS:   No education needs have been identified at this time  Skin:  Skin Assessment: Skin Integrity Issues: Skin Integrity Issues:: Stage I, Stage III Stage I: Mid Sacrum; right hip Stage III: Right heel  Last BM:  11/03/22  Height:   Ht Readings from Last 1 Encounters:  11/01/22 '4\' 11"'$  (1.499 m)    Weight:   Wt Readings from Last 1 Encounters:  11/08/22 51.8 kg    Ideal Body Weight:  44.7 kg  BMI:  Body mass index is 23.07 kg/m.  Estimated Nutritional Needs:   Kcal:  1550-1750  Protein:  80-95 grams  Fluid:  > 1.5 L    Loistine Chance, RD, LDN, [place] Registered Dietitian II Certified Diabetes Care and Education Specialist Please refer to [facility]  for RD and/or RD on-call/weekend/after hours pager

## 2022-11-08 NOTE — Progress Notes (Signed)
Progress Note   Patient: [name] ZWC:[id number] DOB: 12-09-1940 DOA: 11/01/2022     7 DOS: the patient was seen and examined on 11/08/2022   Brief hospital course: [name] is a 81 y.o. female with medical history significant for hypertension,diabetes mellitus, hypothyroidism, gout, breast cancer (s/p radiation and mastectomy), PVD, iron deficiency anemia, dCHF, hospitalized in October 2023 with bilateral lower extremity cellulitis, wheelchair-bound at baseline and with a chronic left heel ulcer, right toe ulcer and sacral decubitus, managed by wound care and podiatry, who presents to the ED with altered mental status. She was  febrile to 102 at the facility and was sent in as a code sepsis.   With EMS O2 sat was 88% on room air requiring 3 L to maintain sats in the high 90s.  ED Tmax 100.4, pulse 106.  BP 149/91 and O2 sat in the high 90s on room air.  WBC 17,000 with lactic acid 4.9-5.  CT abdomen and pelvis showing left external iliac and femoral vein thrombus and chronic obstructive uropathy with mild hydro ureteral nephrosis Placed on IV heparin in the emergency room. Patient also had right acute decubitus ulcer with infection, left great toe ulceration.  Patient had a debridement of the right heel by podiatry on 12/22. Patient is also seen by vascular surgery, planning for left lower extremity thrombectomy after the infection is better controlled. 12/24 Hemoglobin dropped down to 7.0 on 12/24, transfuse, heparin discontinued, requested vascular surgery to place IVC filter. 12/25 Hemoglobin is better after transfusion, scheduled to have thrombectomy and IVC filter 12/26.  Discussed with son, restart heparin drip and a follow hemoglobin every 8 hours. Patient had a lower extremity thrombectomy and IVC filter 12/26, discontinue anticoagulation.     Assessment and Plan:  Severe sepsis ([place]) Right heel wound with infection. Chronic ulcer of great toe of left foot with  fat layer exposed ([place]) Patient is status post debridement, currently no growth.  Blood cultures negative.   Wound culture finalized without growth.  Bone biopsy did not show evidence of osteomyelitis.  Antibiotic changed to doxycycline.   Acute on chronic anemia. Thrombocytosis. Patient received 1 unit of PRBC, hemoglobin is better at 8.4.  No evidence of GI bleed at this time, however, continue Protonix 40 mg IV twice a day. Heparin discontinued, no evidence of GI bleed, Protonix also discontinued. Patient hemoglobin dropped down to 7.4, may be from surgery yesterday.  Repeat hemoglobin again, transfuse when less than 7.   Acute deep vein thrombosis (DVT) of femoral vein of left lower extremity ([place]) Patient had lower extremity thrombectomy and IVC filter placed.  Due to concern of acute bleeding, I will discontinue heparin as patient no longer has risk for PE with current IVC filter in place.   Acute metabolic encephalopathy improved     Hypokalemia Normalized   Chronic diastolic CHF (congestive heart failure) (HCC) No exacerbation.   Acute on chronic urinary retention Patient was able to void, however, residual still was high at 1000 mL, Foley catheter anchored, Flomax started. I will try to discontinue Foley catheter, bladder scan.  Continue Flomax.   History of mastectomy, right History of breast cancer s/p radiation and mastectomy No acute issues   Hypertension Continue atenolol and lisinopril   Diabetes mellitus, type 2 (HCC) Sliding scale insulin coverage         Subjective:  Patient doing well, no additional complaints.  Physical Exam: Vitals:   11/07/22 2341 11/08/22 0506 11/08/22 0725 11/08/22 1151  BP: (!) 113/58 127/63 (!) 142/73 119/73  Pulse: 75 79 78 76  Resp: '18 18 16 16  '$ Temp: 98.9 F (37.2 C) 97.9 F (36.6 C) 98.2 F (36.8 C) 98.6 F (37 C)  TempSrc:    Oral  SpO2: 98% 99% 98% 100%  Weight:      Height:       General exam: Appears calm  and comfortable  Respiratory system: Clear to auscultation. Respiratory effort normal. Cardiovascular system: S1 & S2 heard, RRR. No JVD, murmurs, rubs, gallops or clicks. No pedal edema. Gastrointestinal system: Abdomen is nondistended, soft and nontender. No organomegaly or masses felt. Normal bowel sounds heard. Central nervous system: Alert and oriented. No focal neurological deficits. Extremities: Symmetric 5 x 5 power. Skin: No rashes, lesions or ulcers Psychiatry: Judgement and insight appear normal. Mood & affect appropriate.   Data Reviewed:    Family Communication: Son updated.  Disposition: Status is: Inpatient Remains inpatient appropriate because: Unsafe discharge.  Planned Discharge Destination:  TBD    Time spent: 35 minutes  Author: Sharen Hones, MD 11/08/2022 12:56 PM  For on call review [email or web address].

## 2022-11-08 NOTE — NC FL2 (Signed)
[place] LEVEL OF CARE FORM     IDENTIFICATION  Patient Name: [name] Birthdate: [birth year] Sex: female Admission Date (Current Location): 11/01/2022  [facility] and [place] Number:  Engineering geologist and Address:  [facility], [address] [place], [place] [address]      Provider Number: [id number]  Attending Physician Name and Address:  Sharen Hones, MD  Relative Name and Phone Number:  TAKEILA, THAYNE (Spouse) [phone]    Current Level of Care: Hospital Recommended Level of Care: Assisted Living Facility Prior Approval Number:    Date Approved/Denied:   PASRR Number:    Discharge Plan: Domiciliary (Rest home)    Current Diagnoses: Patient Active Problem List   Diagnosis Date Noted   Reactive thrombocytosis 11/04/2022   Acute deep vein thrombosis (DVT) of femoral vein of left lower extremity ([place]) 11/02/2022   Chronic ulcer of great toe of left foot with fat layer exposed ([place]) 11/02/2022   Acute on chronic anemia 11/02/2022   Acute on chronic urinary retention 11/02/2022   Thrombus 11/02/2022   Pressure injury of skin 81/11/7508   Acute metabolic encephalopathy 25/85/2778   Severe sepsis ([place]) 08/22/2022   Acute diverticulitis 08/22/2022   Hydroureteronephrosis 08/22/2022   Hypokalemia 08/22/2022   Malnutrition of moderate degree 08/18/2022   Cellulitis of lower extremity 08/17/2022   Cellulitis of both lower extremities 08/16/2022   Diabetes mellitus without complication ([place])    Gout    Chronic diastolic CHF (congestive heart failure) ([place])    Iron deficiency anemia    Diarrhea    Cervical spondylosis 03/09/2020   Lower limb ulcer, ankle, right, limited to breakdown of skin ([place]) 09/09/2018   Chronic gouty arthritis 04/29/2018   Generalized osteoarthritis of hand 02/12/2018   Hyperuricemia 02/12/2018   Swelling of finger 02/12/2018   PAD (peripheral artery disease) ([place]) 04/30/2017    Lymphedema 04/30/2017   Bilateral edema of lower extremity 04/03/2017   Ecchymosis 04/03/2017   Leg pain, bilateral 04/03/2017   Acquired hypothyroidism 01/24/2017   Benign essential hypertension 01/24/2017   History of mastectomy, right 01/24/2017   Primary osteoarthritis of left knee 02/29/2016   Cancer of right breast ([place]) 04/17/2015   Chronic pain 03/03/2015   Chronic pain of both knees 03/03/2015   Arthritis 01/11/2014   Soft tissue lesion of shoulder region 01/11/2014   Diabetes mellitus, type 2 ([place]) 01/11/2014   H/O renal calculi 01/11/2014   Hypertension 01/11/2014   HLD (hyperlipidemia) 01/11/2014   Neuropathy 01/11/2014   OP (osteoporosis) 01/11/2014   H/O malignant neoplasm of breast 01/11/2014    Orientation RESPIRATION BLADDER Height & Weight     Self, Time, Place  Normal Indwelling catheter Weight: 82 lb (37.2 kg) Height:  '4\' 11"'$  (149.9 cm)  BEHAVIORAL SYMPTOMS/MOOD NEUROLOGICAL BOWEL NUTRITION STATUS      Continent Diet (regular)  AMBULATORY STATUS COMMUNICATION OF NEEDS Skin   Extensive Assist Verbally PU Stage and Appropriate Care (stage 3 Right heel, gauze and compression wrap daily, stage 4 pressure injury mid sacrum foam gauges change daily, closed incision on right foot compression wrap daily, wound incision on Right Hip unstageable foam dresssing change daily)                       Personal Care Assistance Level of Assistance  Bathing, Feeding, Dressing Bathing Assistance: Limited assistance Feeding assistance: Limited assistance Dressing Assistance: Limited assistance     Functional Limitations Info  Sight, Hearing, Speech  Sight Info: Impaired Hearing Info: Impaired Speech Info: Adequate    SPECIAL CARE FACTORS FREQUENCY  PT (By licensed PT), OT (By licensed OT)     PT Frequency: 3 times a week OT Frequency: 3 times a week            Contractures Contractures Info: Not present    Additional Factors Info  Code Status,  Allergies, Isolation Precautions Code Status Info: FULL Allergies Info: Anesthisia has been a health issue for her in the past- reaction is Anaphylaxis, Sulfa antibiotics     Isolation Precautions Info: MRSA     Current Medications (11/08/2022):   AKE these medications     acetaminophen 500 MG tablet Commonly known as: TYLENOL Take 500-1,000 mg by mouth 4 (four) times daily as needed for mild pain or moderate pain.    allopurinol 300 MG tablet Commonly known as: ZYLOPRIM Take 300 mg by mouth daily.    atenolol 50 MG tablet Commonly known as: TENORMIN Take 50 mg by mouth daily.    atorvastatin 40 MG tablet Commonly known as: LIPITOR Take 40 mg by mouth daily.    cyanocobalamin 1000 MCG tablet Commonly known as: VITAMIN B12 Take 1,000 mcg by mouth daily.    diphenoxylate-atropine 2.5-0.025 MG tablet Commonly known as: LOMOTIL Take 1-2 tablets by mouth 3 (three) times daily as needed.    doxycycline 100 MG tablet Commonly known as: VIBRA-TABS Take 1 tablet (100 mg total) by mouth every 12 (twelve) hours for 3 days.    furosemide 40 MG tablet Commonly known as: LASIX Take 40 mg by mouth daily.    gentamicin cream 0.1 % Commonly known as: GARAMYCIN Apply 1 Application topically in the morning and at bedtime.    HYDROcodone-acetaminophen 7.5-325 MG tablet Commonly known as: NORCO Take 1 tablet by mouth 2 (two) times daily as needed for moderate pain or severe pain.    levothyroxine 88 MCG tablet Commonly known as: SYNTHROID Take 88 mcg by mouth daily.    lisinopril 5 MG tablet Commonly known as: ZESTRIL Take 5 mg by mouth daily.    metFORMIN 1000 MG tablet Commonly known as: GLUCOPHAGE Take 1,000 mg by mouth in the morning and at bedtime.    sitaGLIPtin 50 MG tablet Commonly known as: JANUVIA Take 50 mg by mouth daily.    tamsulosin 0.4 MG Caps capsule Commonly known as: FLOMAX Take 1 capsule (0.4 mg total) by mouth daily for 7 days. Start taking on:  November 10, 2022                           Discharge Medications: Please see discharge summary for a list of discharge medications.  Relevant Imaging Results:  Relevant Lab Results:   Additional Information [phone]  Gerilyn Pilgrim, LCSW

## 2022-11-09 DIAGNOSIS — I82412 Acute embolism and thrombosis of left femoral vein: Secondary | ICD-10-CM | POA: Diagnosis not present

## 2022-11-09 DIAGNOSIS — L97522 Non-pressure chronic ulcer of other part of left foot with fat layer exposed: Secondary | ICD-10-CM | POA: Diagnosis not present

## 2022-11-09 DIAGNOSIS — A419 Sepsis, unspecified organism: Secondary | ICD-10-CM | POA: Diagnosis not present

## 2022-11-09 DIAGNOSIS — G9341 Metabolic encephalopathy: Secondary | ICD-10-CM | POA: Diagnosis not present

## 2022-11-09 LAB — GLUCOSE, CAPILLARY: Glucose-Capillary: 172 mg/dL — ABNORMAL HIGH (ref 70–99)

## 2022-11-09 LAB — CBC
HCT: 26.4 % — ABNORMAL LOW (ref 36.0–46.0)
Hemoglobin: 8 g/dL — ABNORMAL LOW (ref 12.0–15.0)
MCH: 24.9 pg — ABNORMAL LOW (ref 26.0–34.0)
MCHC: 30.3 g/dL (ref 30.0–36.0)
MCV: 82.2 fL (ref 80.0–100.0)
Platelets: 328 10*3/uL (ref 150–400)
RBC: 3.21 MIL/uL — ABNORMAL LOW (ref 3.87–5.11)
RDW: 19.4 % — ABNORMAL HIGH (ref 11.5–15.5)
WBC: 10.5 10*3/uL (ref 4.0–10.5)
nRBC: 0 % (ref 0.0–0.2)

## 2022-11-09 MED ORDER — TAMSULOSIN HCL 0.4 MG PO CAPS: 0.4000 mg | ORAL_CAPSULE | Freq: Every day | ORAL | 0 refills | Status: AC

## 2022-11-09 MED ORDER — DOXYCYCLINE HYCLATE 100 MG PO TABS: 100.0000 mg | ORAL_TABLET | Freq: Two times a day (BID) | ORAL | 0 refills | Status: AC

## 2022-11-09 NOTE — Discharge Summary (Addendum)
Physician Discharge Summary   Patient: [name] MRN: [id number] DOB: 12-31-1940  Admit date:     11/01/2022  Discharge date: 11/09/22  Discharge Physician: Sharen Hones   PCP: Tracie Harrier, MD   Recommendations at discharge:   Follow-up with PCP in 1 week. Follow with podiatry in 1 week. Follow-up with Dr. Lucky Cowboy in 1 week.  Discharge Diagnoses: Principal Problem:   Severe sepsis ([place]) Active Problems:   Acute deep vein thrombosis (DVT) of femoral vein of left lower extremity (HCC)   Acute metabolic encephalopathy   Lower limb ulcer, ankle, right, limited to breakdown of skin (HCC)   Chronic ulcer of great toe of left foot with fat layer exposed (HCC)   Chronic diastolic CHF (congestive heart failure) (HCC)   Hypokalemia   Acute on chronic urinary retention   Diabetes mellitus, type 2 ([place])   Hypertension   H/O malignant neoplasm of breast   Acquired hypothyroidism   History of mastectomy, right   Acute on chronic anemia   Thrombus   Reactive thrombocytosis  Resolved Problems:   * No resolved hospital problems. *  Hospital Course: [name] is a 81 y.o. female with medical history significant for hypertension,diabetes mellitus, hypothyroidism, gout, breast cancer (s/p radiation and mastectomy), PVD, iron deficiency anemia, dCHF, hospitalized in October 2023 with bilateral lower extremity cellulitis, wheelchair-bound at baseline and with a chronic left heel ulcer, right toe ulcer and sacral decubitus, managed by wound care and podiatry, who presents to the ED with altered mental status. She was  febrile to 102 at the facility and was sent in as a code sepsis.   With EMS O2 sat was 88% on room air requiring 3 L to maintain sats in the high 90s.  ED Tmax 100.4, pulse 106.  BP 149/91 and O2 sat in the high 90s on room air.  WBC 17,000 with lactic acid 4.9-5.  CT abdomen and pelvis showing left external iliac and femoral vein thrombus and chronic obstructive  uropathy with mild hydro ureteral nephrosis Placed on IV heparin in the emergency room. Patient also had right acute decubitus ulcer with infection, left great toe ulceration.  Patient had a debridement of the right heel by podiatry on 12/22. Patient is also seen by vascular surgery, planning for left lower extremity thrombectomy after the infection is better controlled. 12/24 Hemoglobin dropped down to 7.0 on 12/24, transfuse, heparin discontinued, requested vascular surgery to place IVC filter. 12/25 Hemoglobin is better after transfusion, scheduled to have thrombectomy and IVC filter 12/26.  Discussed with son, restart heparin drip and a follow hemoglobin every 8 hours. Patient had a lower extremity thrombectomy and IVC filter 12/26, discontinue anticoagulation.     Assessment and Plan:  Severe sepsis ([place]) Right heel wound with infection. Chronic ulcer of great toe of left foot with fat layer exposed ([place]) Patient is status post debridement, currently no growth.  Blood cultures negative.   Wound culture finalized without growth.  Bone biopsy did not show evidence of osteomyelitis.  Antibiotic changed to doxycycline.   Acute on chronic anemia. Thrombocytosis. Patient received 1 unit of PRBC, hemoglobin is better at 8.4.  No evidence of GI bleed at this time, however, continue Protonix 40 mg IV twice a day. Heparin discontinued, no evidence of GI bleed, Protonix also discontinued. Patient hemoglobin dropped down to 7.4 post vascular procedure, possibly back to 8.0.  Patient has no active bleeding now.  Will follow-up with PCP as outpatient.   Acute deep  vein thrombosis (DVT) of femoral vein of left lower extremity ([place]) Patient had lower extremity thrombectomy and IVC filter placed.  Due to concern of acute bleeding, I will discontinue heparin as patient no longer has risk for PE with current IVC filter in place.   Acute metabolic encephalopathy improved      Hypokalemia Normalized   Chronic diastolic CHF (congestive heart failure) (HCC) No exacerbation.   Acute on chronic urinary retention Patient was able to void, however, residual still was high at 1000 mL, Foley catheter anchored, Flomax started. Condition improved, Foley catheter removed 12/28, patient was able to urinate.  Will continue Flomax for 7 days.   History of mastectomy, right History of breast cancer s/p radiation and mastectomy No acute issues   Hypertension Continue atenolol and lisinopril   Diabetes mellitus, type 2 (HCC) Sliding scale insulin coverage  Generalized weakness. Patient to be evaluated by PT/OT, recommended nursing home placement.  Discussed with patient, husband and son, patient currently lives in an assisted living facility, the family owns a facility.  Husband and son is available all the time, they prefer she transfer back to assisted living.  I will also set up PT/OT/aids/RN to follow-up.  Pressure ulcer POA Pressure Injury 08/24/22 Sacrum Mid Stage 4 - Full thickness tissue loss with exposed bone, tendon or muscle. (Active)  08/24/22 1231  Location: Sacrum  Location Orientation: Mid  Staging: Stage 4 - Full thickness tissue loss with exposed bone, tendon or muscle.  Wound Description (Comments):   Present on Admission: Yes     Pressure Injury 10/09/22 Heel Right Stage 3 -  Full thickness tissue loss. Subcutaneous fat may be visible but bone, tendon or muscle are NOT exposed. (Active)  10/09/22 0139  Location: Heel  Location Orientation: Right  Staging: Stage 3 -  Full thickness tissue loss. Subcutaneous fat may be visible but bone, tendon or muscle are NOT exposed.  Wound Description (Comments):   Present on Admission: Yes         Consultants: Podiatry, vascular  Procedures performed: Lower extremity thrombectomy, IVC Filter, debridement of the heel wound. Disposition:  ALF Diet recommendation:  Discharge Diet Orders (From admission,  onward)     Start     Ordered   11/09/22 0000  Diet - low sodium heart healthy        11/09/22 1012           Cardiac diet DISCHARGE MEDICATION: Allergies as of 11/09/2022       Reactions   Other Anaphylaxis   Anesthisia has been a health issue for her in the past.    Sulfa Antibiotics Rash        Medication List     TAKE these medications    acetaminophen 500 MG tablet Commonly known as: TYLENOL Take 500-1,000 mg by mouth 4 (four) times daily as needed for mild pain or moderate pain.   allopurinol 300 MG tablet Commonly known as: ZYLOPRIM Take 300 mg by mouth daily.   atenolol 50 MG tablet Commonly known as: TENORMIN Take 50 mg by mouth daily.   atorvastatin 40 MG tablet Commonly known as: LIPITOR Take 40 mg by mouth daily.   cyanocobalamin 1000 MCG tablet Commonly known as: VITAMIN B12 Take 1,000 mcg by mouth daily.   diphenoxylate-atropine 2.5-0.025 MG tablet Commonly known as: LOMOTIL Take 1-2 tablets by mouth 3 (three) times daily as needed.   doxycycline 100 MG tablet Commonly known as: VIBRA-TABS Take 1 tablet (100 mg total) by  mouth every 12 (twelve) hours for 3 days.   furosemide 40 MG tablet Commonly known as: LASIX Take 40 mg by mouth daily.   gentamicin cream 0.1 % Commonly known as: GARAMYCIN Apply 1 Application topically in the morning and at bedtime.   HYDROcodone-acetaminophen 7.5-325 MG tablet Commonly known as: NORCO Take 1 tablet by mouth 2 (two) times daily as needed for moderate pain or severe pain.   levothyroxine 88 MCG tablet Commonly known as: SYNTHROID Take 88 mcg by mouth daily.   lisinopril 5 MG tablet Commonly known as: ZESTRIL Take 5 mg by mouth daily.   metFORMIN 1000 MG tablet Commonly known as: GLUCOPHAGE Take 1,000 mg by mouth in the morning and at bedtime.   sitaGLIPtin 50 MG tablet Commonly known as: JANUVIA Take 50 mg by mouth daily.   tamsulosin 0.4 MG Caps capsule Commonly known as:  FLOMAX Take 1 capsule (0.4 mg total) by mouth daily for 7 days. Start taking on: November 10, 2022               Discharge Care Instructions  (From admission, onward)           Start     Ordered   11/09/22 0000  Discharge wound care:       Comments: Follow with visitng RN and PCP. 1. Apply Medihoney to right hip Q day, then cover with foam dressing.  (Change foam dressing Q 3 days or PRN soiling. ) 2. Cut piece of Aquacel Kellie Simmering # [phone]) and tuck into sacrum wound Q day, using swab to fill, then cover with foam dressing.  (Change foam dressing Q 3 days or PRN soiling.)   11/09/22 1012            Follow-up Information     Hande, Vishwanath, MD Follow up in 1 week(s).   Specialty: Internal Medicine Contact information: [place] [address] [phone]         Algernon Huxley, MD Follow up in 1 week(s).   Specialties: Vascular Surgery, Radiology, Interventional Cardiology Contact information: [address] [place] [address] [phone]         Samara Deist, DPM Follow up in 1 week(s).   Specialty: Podiatry Contact information: [place] [address] [phone]                Discharge Exam: Danley Danker Weights   11/01/22 1954 11/08/22 1626  Weight: 37.2 kg 51.8 kg   General exam: Appears calm and comfortable  Respiratory system: Clear to auscultation. Respiratory effort normal. Cardiovascular system: S1 & S2 heard, RRR. No JVD, murmurs, rubs, gallops or clicks. No pedal edema. Gastrointestinal system: Abdomen is nondistended, soft and nontender. No organomegaly or masses felt. Normal bowel sounds heard. Central nervous system: Alert and oriented x3. No focal neurological deficits. Extremities: Symmetric 5 x 5 power. Skin: heel wound Psychiatry: Judgement and insight appear normal. Mood & affect appropriate.    Condition at discharge: good  The results of significant  diagnostics from this hospitalization (including imaging, microbiology, ancillary and laboratory) are listed below for reference.   Imaging Studies: PERIPHERAL VASCULAR CATHETERIZATION  Result Date: 11/06/2022 See surgical note for result.  ECHOCARDIOGRAM COMPLETE  Result Date: 11/03/2022    ECHOCARDIOGRAM REPORT   Patient Name:   SHAKEITA VANDEVANDER [facility] Date of Exam: 11/03/2022 Medical Rec #:  [id number]          Height:       59.0  in Accession #:    [id number]         Weight:       82.0 lb Date of Birth:  08/11/1941          BSA:          1.263 m Patient Age:    73 years           BP:           123/73 mmHg Patient Gender: F                  HR:           76 bpm. Exam Location:  [facility] Procedure: 2D Echo, Color Doppler and Cardiac Doppler Indications:     Bacteremia  History:         Patient has prior history of Echocardiogram examinations, most                  recent 06/30/2018. CHF, PVD; Risk Factors:Hypertension and                  Diabetes. Hx of breast cancer s/p radiation therapy.  Sonographer:     Eartha Inch Referring Phys:  [phone] [place] Diagnosing Phys: Neoma Laming  Sonographer Comments: Technically difficult study due to poor echo windows. Image acquisition challenging due to patient body habitus, Image acquisition challenging due to uncooperative patient and Image acquisition challenging due to respiratory motion. IMPRESSIONS  1. Left ventricular ejection fraction, by estimation, is 55 to 60%. The left ventricle has normal function. The left ventricle has no regional wall motion abnormalities. There is moderate concentric left ventricular hypertrophy. Left ventricular diastolic parameters are consistent with Grade I diastolic dysfunction (impaired relaxation).  2. Right ventricular systolic function is normal. The right ventricular size is normal.  3. Left atrial size was severely dilated.  4. Right atrial size was moderately dilated.  5. The mitral valve is normal in structure. Mild  mitral valve regurgitation. No evidence of mitral stenosis.  6. The aortic valve is normal in structure. Aortic valve regurgitation is trivial. No aortic stenosis is present.  7. The inferior vena cava is normal in size with greater than 50% respiratory variability, suggesting right atrial pressure of 3 mmHg. FINDINGS  Left Ventricle: Left ventricular ejection fraction, by estimation, is 55 to 60%. The left ventricle has normal function. The left ventricle has no regional wall motion abnormalities. The left ventricular internal cavity size was normal in size. There is  moderate concentric left ventricular hypertrophy. Left ventricular diastolic parameters are consistent with Grade I diastolic dysfunction (impaired relaxation). Right Ventricle: The right ventricular size is normal. No increase in right ventricular wall thickness. Right ventricular systolic function is normal. Left Atrium: Left atrial size was severely dilated. Right Atrium: Right atrial size was moderately dilated. Pericardium: There is no evidence of pericardial effusion. Mitral Valve: The mitral valve is normal in structure. Mild mitral valve regurgitation. No evidence of mitral valve stenosis. MV peak gradient, 8.3 mmHg. The mean mitral valve gradient is 4.0 mmHg. Tricuspid Valve: The tricuspid valve is normal in structure. Tricuspid valve regurgitation is mild . No evidence of tricuspid stenosis. Aortic Valve: The aortic valve is normal in structure. Aortic valve regurgitation is trivial. No aortic stenosis is present. Pulmonic Valve: The pulmonic valve was normal in structure. Pulmonic valve regurgitation is trivial. No evidence of pulmonic stenosis. Aorta: The aortic root is normal in size and structure. Venous: The inferior vena cava is  normal in size with greater than 50% respiratory variability, suggesting right atrial pressure of 3 mmHg. IAS/Shunts: No atrial level shunt detected by color flow Doppler.  LEFT VENTRICLE PLAX 2D LVIDd:          3.90 cm Diastology LVIDs:         2.70 cm LV e' medial:    5.77 cm/s LV PW:         1.00 cm LV E/e' medial:  18.0 LV IVS:        0.80 cm LV e' lateral:   5.98 cm/s                        LV E/e' lateral: 17.4  RIGHT VENTRICLE             IVC RV S prime:     14.70 cm/s  IVC diam: 1.50 cm TAPSE (M-mode): 1.8 cm LEFT ATRIUM             Index        RIGHT ATRIUM          Index LA diam:        3.80 cm 3.01 cm/m   RA Area:     7.53 cm LA Vol (A2C):   52.8 ml 41.82 ml/m  RA Volume:   10.70 ml 8.47 ml/m LA Vol (A4C):   31.6 ml 25.03 ml/m LA Biplane Vol: 41.7 ml 33.03 ml/m  AORTIC VALVE LVOT Vmax:   124.00 cm/s LVOT Vmean:  86.100 cm/s LVOT VTI:    0.293 m  AORTA Ao Asc diam: 3.40 cm MITRAL VALVE MV Area (PHT): 2.32 cm     SHUNTS MV Peak grad:  8.3 mmHg     Systemic VTI: 0.29 m MV Mean grad:  4.0 mmHg MV Vmax:       1.44 m/s MV Vmean:      90.3 cm/s MV Decel Time: 327 msec MV E velocity: 104.00 cm/s MV A velocity: 149.00 cm/s MV E/A ratio:  0.70 Shaukat Khan Electronically signed by Neoma Laming Signature Date/Time: 11/03/2022/2:07:40 PM    Final    US ARTERIAL LOWER EXTREMITY DUPLEX RIGHT(NON-ABI)  Result Date: 11/03/2022 CLINICAL DATA:  Right heel ulcer. History of hypertension, hyperlipidemia and diabetes. EXAM: RIGHT LOWER EXTREMITY ARTERIAL DUPLEX SCAN TECHNIQUE: Gray-scale sonography as well as color Doppler and duplex ultrasound was performed to evaluate the lower extremity arteries including the common, superficial and profunda femoral arteries, popliteal artery and calf arteries. COMPARISON:  None Available. FINDINGS: Right lower Extremity Inflow: Normal common femoral arterial waveforms and velocities. No evidence of inflow (aortoiliac) disease. Outflow: Normal profunda femoral, superficial femoral and popliteal arterial waveforms and velocities. No focal elevation of the PSV to suggest stenosis. Mild calcified plaque in the visualized SFA. Runoff: Normal posterior and anterior tibial arterial waveforms  and velocities. Vessels are patent to the ankle. Posterior and anterior tibial arterial waveforms and velocities. Vessels are patent to the ankle. IMPRESSION: No evidence of significant arterial occlusive disease in the right lower extremity. Atherosclerosis in the superficial femoral artery. Electronically Signed   By: Aletta Edouard M.D.   On: 11/03/2022 12:13   DG Os Calcis Right  Result Date: 11/02/2022 CLINICAL DATA:  Ulcer of heel, right, with necrosis of bone (HCC) EXAM: RIGHT OS CALCIS - 2+ VIEW COMPARISON:  Ankle radiograph 09/19/2015 FINDINGS: There is a posterior heel soft tissue ulcer with subcortical lucency in the posterior calcaneus. No frank bony destruction. There is a large  dorsal calcaneal spur and Haglund deformity, similar to prior exam. There is severe tibiotalar arthritis evidence of prior trauma and abandoned screw in the distal tibia. Moderate subtalar joint arthritis. IMPRESSION: Posterior heel soft tissue ulcer adjacent subcortical lucency in the posterior calcaneus, suspicious for osteomyelitis. MRI would be more sensitive. Severe tibiotalar and moderate subtalar arthritis. Electronically Signed   By: Maurine Simmering M.D.   On: 11/02/2022 13:28   CT ABDOMEN PELVIS W CONTRAST  Result Date: 11/01/2022 CLINICAL DATA:  abd pain, hx of diverticulitis EXAM: CT ABDOMEN AND PELVIS WITH CONTRAST TECHNIQUE: Multidetector CT imaging of the abdomen and pelvis was performed using the standard protocol following bolus administration of intravenous contrast. RADIATION DOSE REDUCTION: This exam was performed according to the departmental dose-optimization program which includes automated exposure control, adjustment of the mA and/or kV according to patient size and/or use of iterative reconstruction technique. CONTRAST:  39m OMNIPAQUE IOHEXOL 300 MG/ML  SOLN COMPARISON:  CT abdomen pelvis 09/26/2018, PET CT 12/23/2012, CT abdomen pelvis 08/22/2022 FINDINGS: Lower chest: Trace right pleural  effusion. Coronary artery and mitral annular calcification. Small hiatal hernia. Hepatobiliary: No focal liver abnormality. Status post cholecystectomy. No biliary dilatation. Pancreas: No focal lesion. Normal pancreatic contour. No surrounding inflammatory changes. No main pancreatic ductal dilatation. Spleen: Normal in size without focal abnormality. Adrenals/Urinary Tract: No adrenal nodule bilaterally. Bilateral kidneys enhance symmetrically. Persistent bilateral mild hydronephrosis and hydroureter. Extrarenal pelvises bilaterally. The urinary bladder is distended with urine. Foci of gas within the urinary bladder lumen. Stomach/Bowel: Stomach is within normal limits. No evidence of bowel wall thickening or dilatation. Colonic diverticulosis. Appendix appears normal. Vascular/Lymphatic: The inferior vena cava is patent. Main portal vein, splenic, superior mesenteric veins are patent. Left external iliac and femoral vein vein thrombus (2:70). No abdominal aorta or iliac aneurysm. Severe atherosclerotic plaque of the aorta and its branches. No abdominal, pelvic, or inguinal lymphadenopathy. Reproductive: Status post hysterectomy. No adnexal masses. Other: No intraperitoneal free fluid. No intraperitoneal free gas. No organized fluid collection. Musculoskeletal: No abdominal wall hernia or abnormality. Diffusely decreased bone density. No suspicious lytic or blastic osseous lesions. No acute displaced fracture. Multilevel severe degenerative changes of the spine. Mild retrolisthesis of L1 on L2, L2 on L3, L3 on L4. Grade 1 anterolisthesis L4 on L5. IMPRESSION: 1. 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 2. Trace right pleural effusion 3. Small hiatal hernia. 4. Urinary bladder distended with urine with persistent bilateral mild hydroureteronephrosis. This may reflect chronic changes of obstructive uropathy or reflux. Consider urologic consultation. 5. Foci within the urinary bladder  lumen suggestive of recent instrumentation. Correlate with urinalysis for infection if clinically indicated. 6. Colonic diverticulosis with no acute diverticulitis. 7. Aortic Atherosclerosis (ICD10-I70.0) including coronary and mitral annular calcifications. Electronically Signed   By: MIven FinnM.D.   On: 11/01/2022 22:46   DG Chest Port 1 View  Result Date: 11/01/2022 CLINICAL DATA:  Fever EXAM: PORTABLE CHEST 1 VIEW COMPARISON:  Chest radiograph dated 08/22/2022 FINDINGS: Patient is rotated to the right. Normal lung volumes. No focal consolidations. No pleural effusion or pneumothorax. The heart size and mediastinal contours are within normal limits. Right humeral anchor. IMPRESSION: Allowing for patient rotation, no evidence of acute cardiopulmonary disease. Electronically Signed   By: LDarrin NipperM.D.   On: 11/01/2022 20:38    Microbiology: Results for orders placed or performed during the hospital encounter of 11/01/22  Resp panel by RT-PCR (RSV, Flu A&B, Covid) Anterior Nasal Swab  Status: None   Collection Time: 11/01/22  7:47 PM   Specimen: Anterior Nasal Swab  Result Value Ref Range Status   SARS Coronavirus 2 by RT PCR NEGATIVE NEGATIVE Final    Comment: (NOTE) SARS-CoV-2 target nucleic acids are NOT DETECTED.  The SARS-CoV-2 RNA is generally detectable in upper respiratory specimens during the acute phase of infection. The lowest concentration of SARS-CoV-2 viral copies this assay can detect is 138 copies/mL. A negative result does not preclude SARS-Cov-2 infection and should not be used as the sole basis for treatment or other patient management decisions. A negative result may occur with  improper specimen collection/handling, submission of specimen other than nasopharyngeal swab, presence of viral mutation(s) within the areas targeted by this assay, and inadequate number of viral copies(<138 copies/mL). A negative result must be combined with clinical observations,  patient history, and epidemiological information. The expected result is Negative.  Fact Sheet for Patients:  EntrepreneurPulse.com.au  Fact Sheet for Healthcare Providers:  IncredibleEmployment.be  This test is no t yet approved or cleared by the [place] FDA and  has been authorized for detection and/or diagnosis of SARS-CoV-2 by FDA under an Emergency Use Authorization (EUA). This EUA will remain  in effect (meaning this test can be used) for the duration of the COVID-19 declaration under Section 564(b)(1) of the Act, 21 U.S.C.section 360bbb-3(b)(1), unless the authorization is terminated  or revoked sooner.       Influenza A by PCR NEGATIVE NEGATIVE Final   Influenza B by PCR NEGATIVE NEGATIVE Final    Comment: (NOTE) The Xpert Xpress SARS-CoV-2/FLU/RSV plus assay is intended as an aid in the diagnosis of influenza from Nasopharyngeal swab specimens and should not be used as a sole basis for treatment. Nasal washings and aspirates are unacceptable for Xpert Xpress SARS-CoV-2/FLU/RSV testing.  Fact Sheet for Patients: EntrepreneurPulse.com.au  Fact Sheet for Healthcare Providers: IncredibleEmployment.be  This test is not yet approved or cleared by the [place] FDA and has been authorized for detection and/or diagnosis of SARS-CoV-2 by FDA under an Emergency Use Authorization (EUA). This EUA will remain in effect (meaning this test can be used) for the duration of the COVID-19 declaration under Section 564(b)(1) of the Act, 21 U.S.C. section 360bbb-3(b)(1), unless the authorization is terminated or revoked.     Resp Syncytial Virus by PCR NEGATIVE NEGATIVE Final    Comment: (NOTE) Fact Sheet for Patients: EntrepreneurPulse.com.au  Fact Sheet for Healthcare Providers: IncredibleEmployment.be  This test is not yet approved or cleared by the [place] FDA and has been authorized for detection and/or diagnosis of SARS-CoV-2 by FDA under an Emergency Use Authorization (EUA). This EUA will remain in effect (meaning this test can be used) for the duration of the COVID-19 declaration under Section 564(b)(1) of the Act, 21 U.S.C. section 360bbb-3(b)(1), unless the authorization is terminated or revoked.  Performed at [facility], [address] [place], [place] [address]   Blood Culture (routine x 2)     Status: None   Collection Time: 11/01/22  7:52 PM   Specimen: BLOOD  Result Value Ref Range Status   Specimen Description BLOOD BLOOD LEFT FOREARM  Final   Special Requests   Final    BOTTLES DRAWN AEROBIC AND ANAEROBIC Blood Culture adequate volume   Culture   Final    NO GROWTH 5 DAYS Performed at [facility], [address], [place], [place] [address]    Report Status 11/06/2022 FINAL  Final  Urine Culture  Status: None   Collection Time: 11/01/22  7:52 PM   Specimen: In/Out Cath Urine  Result Value Ref Range Status   Specimen Description   Final    IN/OUT CATH URINE Performed at [facility], [address] [place], [place] [address]    Special Requests   Final    NONE Performed at [facility], [address], [place], [place] [address]    Culture   Final    NO GROWTH Performed at [facility], [place] [address] [place], [place] [address]    Report Status 11/03/2022 FINAL  Final  Blood Culture (routine x 2)     Status: None   Collection Time: 11/01/22  7:57 PM   Specimen: BLOOD  Result Value Ref Range Status   Specimen Description BLOOD BLOOD RIGHT WRIST  Final   Special Requests   Final    BOTTLES DRAWN AEROBIC AND ANAEROBIC Blood Culture adequate volume   Culture   Final    NO GROWTH 5 DAYS Performed at [facility], [address], [place], [place] [address]    Report Status 11/06/2022 FINAL  Final  MRSA Next Gen by PCR, Nasal     Status: None    Collection Time: 11/02/22 11:25 AM   Specimen: Nasal Mucosa; Nasal Swab  Result Value Ref Range Status   MRSA by PCR Next Gen NOT DETECTED NOT DETECTED Final    Comment: (NOTE) The GeneXpert MRSA Assay (FDA approved for NASAL specimens only), is one component of a comprehensive MRSA colonization surveillance program. It is not intended to diagnose MRSA infection nor to guide or monitor treatment for MRSA infections. Test performance is not FDA approved in patients less than 77 years old. Performed at [facility], [address] [place], [place] [address]   Aerobic/Anaerobic Culture w Gram Stain (surgical/deep wound)     Status: None   Collection Time: 11/02/22  5:41 PM   Specimen: PATH Bone biopsy; Tissue  Result Value Ref Range Status   Specimen Description   Final    BONE Performed at [facility], [address] [place], [place] [address]    Special Requests   Final    NONE Performed at [facility], [place], [place] [address]    Gram Stain NO ORGANISMS SEEN NO WBC SEEN   Final   Culture   Final    No growth aerobically or anaerobically. Performed at [facility], [place] [address], [place], [place] [address]    Report Status 11/07/2022 FINAL  Final    Labs: CBC: Recent Labs  Lab 11/05/22 0648 11/05/22 1420 11/06/22 1330 11/07/22 0601 11/08/22 0456 11/08/22 1332 11/09/22 0519  WBC 9.7  --  11.9* 11.2* 11.5*  --  10.5  HGB 8.4*   < > 8.5* 8.3* 7.4* 7.6* 8.0*  HCT 27.5*  --  28.0* 27.7* 24.6*  --  26.4*  MCV 80.6  --  82.8 83.4 82.6  --  82.2  PLT 385  --  372 330 320  --  328   < > = values in this interval not displayed.   Basic Metabolic Panel: Recent Labs  Lab 11/05/22 0648 11/06/22 0532 11/06/22 1330 11/07/22 0601  NA 141 141  --  141  K 2.7* 3.6  --  3.8  CL 110 111  --  114*  CO2 22 21*  --  23  GLUCOSE 95 119*  --  123*  BUN 17 16  --  16  CREATININE 0.75 0.80 0.77 0.79  CALCIUM 7.9*  7.8*  --  7.7*  MG 1.9 1.7  --  1.7   Liver Function Tests: No results for input(s): "AST", "ALT", "ALKPHOS", "BILITOT", "PROT", "ALBUMIN" in the last 168 hours. CBG: Recent Labs  Lab 11/08/22 0729 11/08/22 1153 11/08/22 1559 11/08/22 2125 11/09/22 0920  GLUCAP 119* 123* 132* 166* 172*    Discharge time spent: greater than 30 minutes.  Signed: Sharen Hones, MD [facility] 11/09/2022

## 2022-11-09 NOTE — TOC Transition Note (Signed)
Transition of Care [facility]) - CM/SW Discharge Note   Patient Details  Name: [name] MRN: [id number] Date of Birth: 1940-11-20  Transition of Care [facility]) CM/SW Contact:  Gerilyn Pilgrim, LCSW Phone Number: 11/09/2022, 10:47 AM   Clinical Narrative:   SW spoke with spouse who states he has changed his mind and would like for his wife to go back to ALF. Pt resident at [facility] [place], [place] [address]. DC summary given to husband per his request. He states he and his wife are on the admission team at the ALF so he wants TOC to provide the DC summary in the admissions packet. SW will arrange [facility] for patient. Pt in with Alvis Lemmings, corey with [facility] notified of discharge. No additional needs.     Final next level of care: Assisted Living Barriers to Discharge: Barriers Resolved   Patient Goals and CMS Choice CMS Medicare.gov Compare Post Acute Care list provided to:: Patient Choice offered to / list presented to : Spouse  Discharge Placement                  Patient to be transferred to facility by: [facility] Name of family member notified: husband Patient and family notified of of transfer: 11/09/22  Discharge Plan and Services Additional resources added to the After Visit Summary for                            [facility] Arranged: PT, OT, Nurse's Aide, RN [facility] Agency: [place] Date [facility] Agency Contacted: 11/08/22   Representative spoke with at [place]: [place] Determinants of Health ([place]) Interventions [place]: No Food Insecurity (11/02/2022)  Housing: Low Risk  (11/02/2022)  Transportation Needs: No Transportation Needs (11/02/2022)  Utilities: Not At Risk (11/02/2022)  Depression (PHQ2-9): Low Risk  (01/29/2019)  Tobacco Use: Low Risk  (11/06/2022)     Readmission Risk Interventions    11/07/2022    4:05 PM 08/24/2022   10:41 AM  Readmission Risk Prevention Plan  Transportation Screening Complete  Complete  PCP or Specialist Appt within 3-5 Days  Complete  HRI or [place]  Not Complete  HRI or Home Care Consult comments  SW will arrange this admission  Social Work Consult for [place] Planning/Counseling  Complete  Palliative Care Screening  Not Applicable  Medication Review [employer] photographer) Complete Complete  [place] Complete

## 2022-11-09 NOTE — Progress Notes (Signed)
Physical Therapy Treatment Patient Details Name: [name] MRN: [id number] DOB: 09/28/1941 Today's Date: 11/09/2022   History of Present Illness 81 y.o. female with medical history significant for HTN,diabetes mellitus, hypothyroidism, gout, breast cancer (s/p radiation and R mastectomy), PVD, iron deficiency anemia, dCHF, hospitalized in October 2023 with bilateral lower extremity cellulitis, wheelchair-bound at baseline and with a chronic left heel ulcer, right toe ulcer and sacral decubitus, managed by wound care and podiatry, who presents to the ED with altered mental status d/t sepsis. Pt is now s/p debridement of R heel and LLE thrombectomy with IVC filter placement.    PT Comments    The pt presents with improved static sitting balance and ability to tolerate transfer training. The pt is able to complete x2 transfers this session, but requires maximum verbal cueing for sequencing, set-up assistance and Mod A+2 for safety with transfer. The pt also requires several seated rest breaks this session d/t impaired functional endurance. Prior to admission the pt is reported to require supervision for bed mobility and transfers. At this time she is well below her functional baseline and would greatly benefit from STR in order to optimize independence with functional mobility. PT will continue to follow.  3  Recommendations for follow up therapy are one component of a multi-disciplinary discharge planning process, led by the attending physician.  Recommendations may be updated based on patient status, additional functional criteria and insurance authorization.  Follow Up Recommendations  Skilled nursing-short term rehab (<3 hours/day) Can patient physically be transported by private vehicle: No   Assistance Recommended at Discharge Frequent or constant Supervision/Assistance  Patient can return home with the following A lot of help with walking and/or transfers;A lot of help with  bathing/dressing/bathroom;Help with stairs or ramp for entrance;Assist for transportation;Assistance with cooking/housework   Equipment Recommendations       Recommendations for Other Services       Precautions / Restrictions Precautions Precautions: Fall Restrictions Weight Bearing Restrictions: No     Mobility  Bed Mobility Overal bed mobility: Needs Assistance Bed Mobility: Sit to Supine       Sit to supine: Mod assist, +2 for safety/equipment   General bed mobility comments: Pt requiring step by step verbal cues for reverse log roll technique. She also requires LE management to return to supine.    Transfers Overall transfer level: Needs assistance Equipment used: 1 person hand held assist Transfers: Bed to chair/wheelchair/BSC       Squat pivot transfers: Mod assist, +2 safety/equipment     General transfer comment: Pt requires assistance to achieve buttock clearance and maintain position. She then requires assistance to pivot her hips towards the direction of the next surface    Ambulation/Gait                   Stairs             Wheelchair Mobility    Modified Rankin (Stroke Patients Only)       Balance Overall balance assessment: Needs assistance   Sitting balance-Leahy Scale: Fair                                      Cognition Arousal/Alertness: Awake/alert Behavior During Therapy: WFL for tasks assessed/performed Overall Cognitive Status: No family/caregiver present to determine baseline cognitive functioning  Problem Solving: Slow processing, Difficulty sequencing, Decreased initiation General Comments: Pt unable to describe how she typically transfers to a w/c.        Exercises      General Comments        Pertinent Vitals/Pain Pain Assessment Pain Location: chronic back pain Pain Descriptors / Indicators: Discomfort    Home Living                           Prior Function            PT Goals (current goals can now be found in the care plan section) Acute Rehab PT Goals Patient Stated Goal: Return to her husband Time For Goal Achievement: 11/21/22 Potential to Achieve Goals: Fair Progress towards PT goals: Progressing toward goals    Frequency    Min 2X/week      PT Plan Current plan remains appropriate    Co-evaluation   Reason for Co-Treatment: To address functional/ADL transfers   OT goals addressed during session: ADL's and self-care;Proper use of Adaptive equipment and DME;Strengthening/ROM      AM-PAC PT "6 Clicks" Mobility   Outcome Measure  Help needed turning from your back to your side while in a flat bed without using bedrails?: A Lot Help needed moving from lying on your back to sitting on the side of a flat bed without using bedrails?: A Lot Help needed moving to and from a bed to a chair (including a wheelchair)?: A Lot Help needed standing up from a chair using your arms (e.g., wheelchair or bedside chair)?: A Lot Help needed to walk in hospital room?: Total Help needed climbing 3-5 steps with a railing? : Total 6 Click Score: 10    End of Session Equipment Utilized During Treatment: Gait belt Activity Tolerance: Patient tolerated treatment well Patient left: in bed;with bed alarm set;with call bell/phone within reach Nurse Communication: Mobility status PT Visit Diagnosis: Difficulty in walking, not elsewhere classified (R26.2);Muscle weakness (generalized) (M62.81)     Time: 7741-4239 PT Time Calculation (min) (ACUTE ONLY): 37 min  Charges:  $Therapeutic Activity: 23-37 mins                     10:47 AM, 11/09/22 Lashon Beringer A. Saverio Danker PT, DPT Physical Therapist - [facility]    Deloss Amico A Lazariah Savard 11/09/2022, 10:44 AM

## 2022-11-09 NOTE — Progress Notes (Signed)
[name] to be D/C'd to [facility] per MD order. Discussed with the patient's husband Pilar Plate and all questions fully answered.  Skin clean and dry, extra dressing supplies sent with patient.  IV catheter discontinued intact. Site without signs and symptoms of complications. Dressing and pressure applied.  An After Visit Summary was printed and given to the patient.  Patient escorted via stretcher, and D/C home via EMS.  Melonie Florida  11/09/2022 11:55 AM

## 2022-11-09 NOTE — Progress Notes (Signed)
Occupational Therapy Treatment Patient Details Name: [name] MRN: [id number] DOB: [birth year] Today's Date: 11/09/2022   History of present illness 81 y.o. female with medical history significant for HTN,diabetes mellitus, hypothyroidism, gout, breast cancer (s/p radiation and R mastectomy), PVD, iron deficiency anemia, dCHF, hospitalized in October 2023 with bilateral lower extremity cellulitis, wheelchair-bound at baseline and with a chronic left heel ulcer, right toe ulcer and sacral decubitus, managed by wound care and podiatry, who presents to the ED with altered mental status d/t sepsis. Pt is now s/p debridement of R heel and LLE thrombectomy with IVC filter placement.   OT comments  Pt received seated on BSC with RN in room. Appearing alert; willing to work with OT on transfers. T/f MOD A x2. See flowsheet below for further details of session. Left in bed with all needs in reach.     Recommendations for follow up therapy are one component of a multi-disciplinary discharge planning process, led by the attending physician.  Recommendations may be updated based on patient status, additional functional criteria and insurance authorization.    Follow Up Recommendations    SNF rehab    Assistance Recommended at Discharge Frequent or constant Supervision/Assistance  Patient can return home with the following  Two people to help with walking and/or transfers;Two people to help with bathing/dressing/bathroom;Help with stairs or ramp for entrance   Equipment Recommendations  Other (comment) (defer to SNF rehab; if pt d/c home, needs BSC, dependent lift, and hospital bed)    Recommendations for Other Services      Precautions / Restrictions Precautions Precautions: Fall Restrictions Weight Bearing Restrictions: No       Mobility Bed Mobility Overal bed mobility: Needs Assistance Bed Mobility: Sit to Supine       Sit to supine: Max assist, +2 for physical assistance    General bed mobility comments: step by step cues    Transfers Overall transfer level: Needs assistance Equipment used: 2 person hand held assist Transfers: Bed to chair/wheelchair/BSC     Squat pivot transfers: Mod assist, +2 physical assistance       General transfer comment: Requires assist of one for maintaining stand and assist of second person for pivoting hips to correct location for transfer     Balance Overall balance assessment: Needs assistance Sitting-balance support: Bilateral upper extremity supported, Feet supported Sitting balance-Leahy Scale: Fair                                     ADL either performed or assessed with clinical judgement   ADL Overall ADL's : Needs assistance/impaired     Grooming: Set up;Wash/dry hands Grooming Details (indicate cue type and reason): from seated with wet wipe to wash hands                 Toilet Transfer: +2 for physical assistance;Moderate assistance Toilet Transfer Details (indicate cue type and reason): Attempted 180 turn first because that is originally what pt stated she did, but she was unable to conceptualize that and initiate movement; then did MOD A x2 squat pivot 90 degree turn. Toileting- Clothing Manipulation and Hygiene: Total assistance;+2 for physical assistance Toileting - Clothing Manipulation Details (indicate cue type and reason): x2 needed for pt to stand; dependent for hygiene (bowel) instanding            Extremity/Trunk Assessment Upper Extremity Assessment Upper Extremity Assessment: Generalized weakness LUE Deficits /  Details: Limited shoulder flexion, requires passive assistance.   Lower Extremity Assessment Lower Extremity Assessment: Defer to PT evaluation;Generalized weakness        Vision       Perception     Praxis      Cognition Arousal/Alertness: Awake/alert Behavior During Therapy: WFL for tasks assessed/performed Overall Cognitive Status: No  family/caregiver present to determine baseline cognitive functioning (Definite cognitive impairments during session today) Area of Impairment: Orientation, Following commands, Problem solving, Safety/judgement                       Following Commands: Follows one step commands with increased time Safety/Judgement: Decreased awareness of safety, Decreased awareness of deficits (Patient required MOD A x2 for transfer, yet when family member called on the phone, pt stated "I did the transfer mostly on my own; they were just there with me.")   Problem Solving: Slow processing, Decreased initiation, Difficulty sequencing General Comments: Pt unable to describe how she typically transfers to a w/c.        Exercises      Shoulder Instructions       General Comments      Pertinent Vitals/ Pain       Pain Assessment Pain Assessment: 0-10 Pain Score:  (unrated) Pain Location: chronic back pain Pain Descriptors / Indicators: Discomfort Pain Intervention(s): Limited activity within patient's tolerance, Repositioned  Home Living                                          Prior Functioning/Environment              Frequency  Min 2X/week        Progress Toward Goals  OT Goals(current goals can now be found in the care plan section)  Progress towards OT goals: Progressing toward goals  Acute Rehab OT Goals Patient Stated Goal: Go home OT Goal Formulation: With patient Time For Goal Achievement: 11/21/22 Potential to Achieve Goals: Fair ADL Goals Pt Will Perform Grooming: with set-up;with supervision;sitting Pt Will Perform Lower Body Dressing: sitting/lateral leans;with mod assist Pt Will Transfer to Toilet: with mod assist;with transfer board;bedside commode  Plan Discharge plan remains appropriate    Co-evaluation    PT/OT/SLP Co-Evaluation/Treatment: Yes Reason for Co-Treatment: To address functional/ADL transfers   OT goals addressed  during session: ADL's and self-care;Proper use of Adaptive equipment and DME;Strengthening/ROM      AM-PAC OT "6 Clicks" Daily Activity     Outcome Measure   Help from another person eating meals?: None Help from another person taking care of personal grooming?: A Little Help from another person toileting, which includes using toliet, bedpan, or urinal?: Total Help from another person bathing (including washing, rinsing, drying)?: A Lot Help from another person to put on and taking off regular upper body clothing?: A Lot Help from another person to put on and taking off regular lower body clothing?: Total 6 Click Score: 13    End of Session Equipment Utilized During Treatment: Gait belt (wheelchair)  OT Visit Diagnosis: Muscle weakness (generalized) (M62.81);Other abnormalities of gait and mobility (R26.89)   Activity Tolerance Patient limited by fatigue   Patient Left in bed;with call bell/phone within reach;with bed alarm set   Nurse Communication Mobility status        Time: 4562-5638 OT Time Calculation (min): 28 min  Charges: OT General Charges $OT  Visit: 1 Visit OT Treatments $Self Care/Home Management : 8-22 mins  Waymon Amato, MS, OTR/L   Vania Rea 11/09/2022, 11:00 AM

## 2022-11-09 NOTE — Care Management Important Message (Signed)
Important Message  Patient Details  Name: [name] MRN: [id number] Date of Birth: 28-Jun-1941   Medicare Important Message Given:  Yes     Dannette Barbara 11/09/2022, 11:35 AM

## 2022-11-14 ENCOUNTER — Ambulatory Visit: Payer: PPO | Admitting: Internal Medicine

## 2022-11-15 DIAGNOSIS — I110 Hypertensive heart disease with heart failure: Secondary | ICD-10-CM | POA: Diagnosis not present

## 2022-11-15 DIAGNOSIS — M1A9XX0 Chronic gout, unspecified, without tophus (tophi): Secondary | ICD-10-CM | POA: Diagnosis not present

## 2022-11-15 DIAGNOSIS — I5032 Chronic diastolic (congestive) heart failure: Secondary | ICD-10-CM | POA: Diagnosis not present

## 2022-11-15 DIAGNOSIS — M5410 Radiculopathy, site unspecified: Secondary | ICD-10-CM | POA: Diagnosis not present

## 2022-11-15 DIAGNOSIS — E1140 Type 2 diabetes mellitus with diabetic neuropathy, unspecified: Secondary | ICD-10-CM | POA: Diagnosis not present

## 2022-11-15 DIAGNOSIS — I700 Atherosclerosis of aorta: Secondary | ICD-10-CM | POA: Diagnosis not present

## 2022-11-15 DIAGNOSIS — M1712 Unilateral primary osteoarthritis, left knee: Secondary | ICD-10-CM | POA: Diagnosis not present

## 2022-11-15 DIAGNOSIS — K5732 Diverticulitis of large intestine without perforation or abscess without bleeding: Secondary | ICD-10-CM | POA: Diagnosis not present

## 2022-11-15 DIAGNOSIS — L03115 Cellulitis of right lower limb: Secondary | ICD-10-CM | POA: Diagnosis not present

## 2022-11-15 DIAGNOSIS — M25561 Pain in right knee: Secondary | ICD-10-CM | POA: Diagnosis not present

## 2022-11-15 DIAGNOSIS — E1151 Type 2 diabetes mellitus with diabetic peripheral angiopathy without gangrene: Secondary | ICD-10-CM | POA: Diagnosis not present

## 2022-11-15 DIAGNOSIS — M19049 Primary osteoarthritis, unspecified hand: Secondary | ICD-10-CM | POA: Diagnosis not present

## 2022-11-15 DIAGNOSIS — M47812 Spondylosis without myelopathy or radiculopathy, cervical region: Secondary | ICD-10-CM | POA: Diagnosis not present

## 2022-11-15 DIAGNOSIS — L89323 Pressure ulcer of left buttock, stage 3: Secondary | ICD-10-CM | POA: Diagnosis not present

## 2022-11-15 DIAGNOSIS — E785 Hyperlipidemia, unspecified: Secondary | ICD-10-CM | POA: Diagnosis not present

## 2022-11-15 DIAGNOSIS — A419 Sepsis, unspecified organism: Secondary | ICD-10-CM | POA: Diagnosis not present

## 2022-11-15 DIAGNOSIS — L03116 Cellulitis of left lower limb: Secondary | ICD-10-CM | POA: Diagnosis not present

## 2022-11-15 DIAGNOSIS — N1330 Unspecified hydronephrosis: Secondary | ICD-10-CM | POA: Diagnosis not present

## 2022-11-15 DIAGNOSIS — L89616 Pressure-induced deep tissue damage of right heel: Secondary | ICD-10-CM | POA: Diagnosis not present

## 2022-11-15 DIAGNOSIS — L97829 Non-pressure chronic ulcer of other part of left lower leg with unspecified severity: Secondary | ICD-10-CM | POA: Diagnosis not present

## 2022-11-15 DIAGNOSIS — G8929 Other chronic pain: Secondary | ICD-10-CM | POA: Diagnosis not present

## 2022-11-15 DIAGNOSIS — M792 Neuralgia and neuritis, unspecified: Secondary | ICD-10-CM | POA: Diagnosis not present

## 2022-11-15 DIAGNOSIS — I87313 Chronic venous hypertension (idiopathic) with ulcer of bilateral lower extremity: Secondary | ICD-10-CM | POA: Diagnosis not present

## 2022-11-15 DIAGNOSIS — I890 Lymphedema, not elsewhere classified: Secondary | ICD-10-CM | POA: Diagnosis not present

## 2022-11-16 ENCOUNTER — Ambulatory Visit (INDEPENDENT_AMBULATORY_CARE_PROVIDER_SITE_OTHER): Payer: PPO | Admitting: Nurse Practitioner

## 2022-11-21 ENCOUNTER — Encounter: Payer: PPO | Attending: Internal Medicine | Admitting: Internal Medicine

## 2022-11-21 DIAGNOSIS — L89210 Pressure ulcer of right hip, unstageable: Secondary | ICD-10-CM | POA: Insufficient documentation

## 2022-11-21 DIAGNOSIS — Z7401 Bed confinement status: Secondary | ICD-10-CM | POA: Diagnosis not present

## 2022-11-21 DIAGNOSIS — E1140 Type 2 diabetes mellitus with diabetic neuropathy, unspecified: Secondary | ICD-10-CM | POA: Insufficient documentation

## 2022-11-21 DIAGNOSIS — E1151 Type 2 diabetes mellitus with diabetic peripheral angiopathy without gangrene: Secondary | ICD-10-CM | POA: Insufficient documentation

## 2022-11-21 DIAGNOSIS — L89513 Pressure ulcer of right ankle, stage 3: Secondary | ICD-10-CM | POA: Diagnosis not present

## 2022-11-21 DIAGNOSIS — E1169 Type 2 diabetes mellitus with other specified complication: Secondary | ICD-10-CM | POA: Insufficient documentation

## 2022-11-21 DIAGNOSIS — S91302A Unspecified open wound, left foot, initial encounter: Secondary | ICD-10-CM

## 2022-11-21 DIAGNOSIS — M86172 Other acute osteomyelitis, left ankle and foot: Secondary | ICD-10-CM | POA: Insufficient documentation

## 2022-11-21 DIAGNOSIS — Z993 Dependence on wheelchair: Secondary | ICD-10-CM | POA: Diagnosis not present

## 2022-11-21 DIAGNOSIS — I5032 Chronic diastolic (congestive) heart failure: Secondary | ICD-10-CM | POA: Insufficient documentation

## 2022-11-21 DIAGNOSIS — L89610 Pressure ulcer of right heel, unstageable: Secondary | ICD-10-CM

## 2022-11-21 DIAGNOSIS — E11622 Type 2 diabetes mellitus with other skin ulcer: Secondary | ICD-10-CM | POA: Diagnosis not present

## 2022-11-21 DIAGNOSIS — I87312 Chronic venous hypertension (idiopathic) with ulcer of left lower extremity: Secondary | ICD-10-CM | POA: Insufficient documentation

## 2022-11-21 DIAGNOSIS — Z853 Personal history of malignant neoplasm of breast: Secondary | ICD-10-CM | POA: Insufficient documentation

## 2022-11-21 DIAGNOSIS — X58XXXA Exposure to other specified factors, initial encounter: Secondary | ICD-10-CM | POA: Diagnosis not present

## 2022-11-21 DIAGNOSIS — I110 Hypertensive heart disease with heart failure: Secondary | ICD-10-CM | POA: Diagnosis not present

## 2022-11-21 DIAGNOSIS — L97822 Non-pressure chronic ulcer of other part of left lower leg with fat layer exposed: Secondary | ICD-10-CM | POA: Insufficient documentation

## 2022-11-21 DIAGNOSIS — L89626 Pressure-induced deep tissue damage of left heel: Secondary | ICD-10-CM | POA: Insufficient documentation

## 2022-11-21 DIAGNOSIS — L89320 Pressure ulcer of left buttock, unstageable: Secondary | ICD-10-CM

## 2022-11-21 DIAGNOSIS — L89620 Pressure ulcer of left heel, unstageable: Secondary | ICD-10-CM | POA: Insufficient documentation

## 2022-11-21 DIAGNOSIS — I890 Lymphedema, not elsewhere classified: Secondary | ICD-10-CM | POA: Diagnosis not present

## 2022-11-22 DIAGNOSIS — M4802 Spinal stenosis, cervical region: Secondary | ICD-10-CM | POA: Diagnosis not present

## 2022-11-22 DIAGNOSIS — M48062 Spinal stenosis, lumbar region with neurogenic claudication: Secondary | ICD-10-CM | POA: Diagnosis not present

## 2022-11-22 DIAGNOSIS — M5020 Other cervical disc displacement, unspecified cervical region: Secondary | ICD-10-CM | POA: Diagnosis not present

## 2022-11-22 DIAGNOSIS — M5030 Other cervical disc degeneration, unspecified cervical region: Secondary | ICD-10-CM | POA: Diagnosis not present

## 2022-11-22 DIAGNOSIS — M5412 Radiculopathy, cervical region: Secondary | ICD-10-CM | POA: Diagnosis not present

## 2022-11-22 DIAGNOSIS — M5136 Other intervertebral disc degeneration, lumbar region: Secondary | ICD-10-CM | POA: Diagnosis not present

## 2022-11-22 DIAGNOSIS — M5416 Radiculopathy, lumbar region: Secondary | ICD-10-CM | POA: Diagnosis not present

## 2022-11-22 NOTE — Progress Notes (Addendum)
MARABETH, MELLAND L ([id number]) [id number].pdf Page 1 of 17 Visit Report for 11/21/2022 Allergy List Details Patient Name: Date of Service: [facility], [name] 11/21/2022 2:45 PM Medical Record Number: [id number] Patient Account Number: [id number] Date of Birth/Sex: Treating RN: Jun 07, 1941 (82 y.o. Drema Pry Primary Care Elnor Renovato: Tracie Harrier Other Clinician: Massie Kluver Referring Huntington Leverich: Treating Isaac Lacson/Extender: Tanna Furry, Vishwanath Weeks in Treatment: 8 Allergies Active Allergies Sulfa (Sulfonamide Antibiotics) Allergy Notes Electronic Signature(s) Signed: 11/22/2022 5:13:59 PM By: Rosalio Loud MSN RN CNS WTA Entered By: Rosalio Loud on 11/22/2022 17:13:59 -------------------------------------------------------------------------------- Arrival Information Details Patient Name: Date of Service: [facility], [name] 11/21/2022 2:45 PM Medical Record Number: [id number] Patient Account Number: [id number] Date of Birth/Sex: Treating RN: 1941/06/26 (82 y.o. Charolette Forward, Kim Primary Care Shanyah Gattuso: Tracie Harrier Other Clinician: Massie Kluver Referring Kenwood Rosiak: Treating Kentley Blyden/Extender: Arn Medal Weeks in Treatment: 8 Visit Information Patient Arrived: Wheel Chair Arrival Time: 14:57 Transfer Assistance: EasyPivot Patient Lift Patient Identification Verified: Yes Secondary Verification Process Completed: Yes Patient Requires Transmission-Based Precautions: No Patient Has Alerts: Yes Patient Alerts: DM II ABI R 1.30 TBI 1.12 ABI L 1.27 TBI 1.21 Koska, Bruchy L ([id number]) History Since Last Visit All ordered tests and consults were completed: No Added or deleted any medications: No Any new allergies or adverse reactions: No Had a fall or experienced change in activities of daily living that may affect risk of falls: No Signs or symptoms of abuse/neglect since last visito  No Hospitalized since last visit: No Implantable device outside of the clinic excluding cellular tissue based products placed in the center since last visit: No Has Dressing in Place as Prescribed: Yes Pain Present Now: Yes Electronic Signature(s) Signed: 11/21/2022 5:10:32 PM By: Massie Kluver Entered By: Massie Kluver on 11/21/2022 15:04:54 -------------------------------------------------------------------------------- Clinic Level of Care Assessment Details Patient Name: Date of Service: [facility], [name] 11/21/2022 2:45 PM Medical Record Number: [id number] Patient Account Number: [id number] Date of Birth/Sex: Treating RN: 1941/06/17 (82 y.o. Charolette Forward, Kim Primary Care Earnstine Meinders: Tracie Harrier Other Clinician: Massie Kluver Referring Katalyn Matin: Treating Ichael Pullara/Extender: Arn Medal Weeks in Treatment: 8 Clinic Level of Care Assessment Items TOOL 4 Quantity Score '[]'$  - 0 Use when only an EandM is performed on FOLLOW-UP visit ASSESSMENTS - Nursing Assessment / Reassessment X- 1 10 Reassessment of Co-morbidities (includes updates in patient status) X- 1 5 Reassessment of Adherence to Treatment Plan ASSESSMENTS - Wound and Skin A ssessment / Reassessment '[]'$  - 0 Simple Wound Assessment / Reassessment - one wound X- 4 5 Complex Wound Assessment / Reassessment - multiple wounds '[]'$  - 0 Dermatologic / Skin Assessment (not related to wound area) ASSESSMENTS - Focused Assessment X- 2 5 Circumferential Edema Measurements - multi extremities '[]'$  - 0 Nutritional Assessment / Counseling / Intervention '[]'$  - 0 Lower Extremity Assessment (monofilament, tuning fork, pulses) '[]'$  - 0 Peripheral Arterial Disease Assessment (using hand held doppler) ASSESSMENTS - Ostomy and/or Continence Assessment and Care '[]'$  - 0 Incontinence Assessment and Management '[]'$  - 0 Ostomy Care Assessment and Management (repouching, etc.) PROCESS - Coordination of Care X - Simple  Patient / Family Education for ongoing care 1 15 '[]'$  - 0 Complex (extensive) Patient / Family Education for ongoing care '[]'$  - 0 Staff obtains Programmer, systems, Records, T Results / Process Orders est '[]'$  - 0 Staff telephones HHA, Nursing Homes / Clarify orders / etc '[]'$  - 0 Routine Transfer to another Facility (non-emergent condition) '[]'$  - 0 Routine Hospital Admission (  non-emergent condition) '[]'$  - 0 New Admissions / Biomedical engineer / Ordering NPWT Apligraf, etc. , '[]'$  - 0 Emergency Hospital Admission (emergent condition) X- 1 10 Simple Discharge Coordination '[]'$  - 0 Complex (extensive) Discharge Coordination PROCESS - Special Needs AZALYNN, MAXIM L ([id number]) [id number].pdf Page 3 of 17 '[]'$  - 0 Pediatric / Minor Patient Management '[]'$  - 0 Isolation Patient Management '[]'$  - 0 Hearing / Language / Visual special needs '[]'$  - 0 Assessment of Community assistance (transportation, D/C planning, etc.) '[]'$  - 0 Additional assistance / Altered mentation '[]'$  - 0 Support Surface(s) Assessment (bed, cushion, seat, etc.) INTERVENTIONS - Wound Cleansing / Measurement '[]'$  - 0 Simple Wound Cleansing - one wound X- 4 5 Complex Wound Cleansing - multiple wounds X- 1 5 Wound Imaging (photographs - any number of wounds) '[]'$  - 0 Wound Tracing (instead of photographs) '[]'$  - 0 Simple Wound Measurement - one wound X- 4 5 Complex Wound Measurement - multiple wounds INTERVENTIONS - Wound Dressings '[]'$  - 0 Small Wound Dressing one or multiple wounds X- 4 15 Medium Wound Dressing one or multiple wounds '[]'$  - 0 Large Wound Dressing one or multiple wounds X- 1 5 Application of Medications - topical '[]'$  - 0 Application of Medications - injection INTERVENTIONS - Miscellaneous '[]'$  - 0 External ear exam '[]'$  - 0 Specimen Collection (cultures, biopsies, blood, body fluids, etc.) '[]'$  - 0 Specimen(s) / Culture(s) sent or taken to Lab for analysis '[]'$  - 0 Patient Transfer (multiple  staff / Civil Service fast streamer / Similar devices) '[]'$  - 0 Simple Staple / Suture removal (25 or less) '[]'$  - 0 Complex Staple / Suture removal (26 or more) '[]'$  - 0 Hypo / Hyperglycemic Management (close monitor of Blood Glucose) '[]'$  - 0 Ankle / Brachial Index (ABI) - do not check if billed separately X- 1 5 Vital Signs Has the patient been seen at the hospital within the last three years: Yes Total Score: 185 Level Of Care: New/Established - Level 5 Electronic Signature(s) Signed: 11/21/2022 5:10:32 PM By: Massie Kluver Entered By: Massie Kluver on 11/21/2022 15:53:08 -------------------------------------------------------------------------------- Encounter Discharge Information Details Patient Name: Date of Service: [facility], [name] 11/21/2022 2:45 PM Medical Record Number: [id number] Patient Account Number: [id number] EMILEA, GOGA ([id number]) [phone].pdf Page 4 of 17 Date of Birth/Sex: Treating RN: [birth year] (82 y.o. [name] Primary Care Shaniqwa Horsman: Tracie Harrier Other Clinician: Massie Kluver Referring Jullisa Grigoryan: Treating Taaliyah Delpriore/Extender: Arn Medal Weeks in Treatment: 8 Encounter Discharge Information Items Discharge Condition: Stable Ambulatory Status: Wheelchair Discharge Destination: Home Transportation: Private Auto Accompanied By: son Schedule Follow-up Appointment: Yes Clinical Summary of Care: Electronic Signature(s) Signed: 11/21/2022 5:10:32 PM By: Massie Kluver Entered By: Massie Kluver on 11/21/2022 17:09:52 -------------------------------------------------------------------------------- Lower Extremity Assessment Details Patient Name: Date of Service: [facility], [name] 11/21/2022 2:45 PM Medical Record Number: [id number] Patient Account Number: [id number] Date of Birth/Sex: Treating RN: Jan 05, 1941 (82 y.o. [name] Primary Care Candia Kingsbury: Tracie Harrier Other Clinician: Massie Kluver Referring Blossom Crume: Treating Briyan Kleven/Extender: Arn Medal Weeks in Treatment: 8 Edema Assessment Assessed: [Left: Yes] [Right: Yes] Edema: [Left: Yes] [Right: Yes] Calf Left: Right: Point of Measurement: 27 cm From Medial Instep 31.4 cm 28 cm Ankle Left: Right: Point of Measurement: 9 cm From Medial Instep 21.4 cm 21.6 cm Vascular Assessment Pulses: Dorsalis Pedis Palpable: [Left:Yes] [Right:Yes] Electronic Signature(s) Signed: 11/21/2022 5:10:32 PM By: Massie Kluver Signed: 11/22/2022 3:17:21 PM By: Gretta Cool, BSN, RN, CWS, Kim RN, BSN Entered By: Massie Kluver on  11/21/2022 15:30:35 BIANCO, CANGE ([id number]) [phone].pdf Page 5 of 17 -------------------------------------------------------------------------------- Multi Wound Chart Details Patient Name: Date of Service: [facility], [name] 11/21/2022 2:45 PM Medical Record Number: [id number] Patient Account Number: [id number] Date of Birth/Sex: Treating RN: Mar 23, 1941 (82 y.o. Charolette Forward, Kim Primary Care Aman Bonet: Tracie Harrier Other Clinician: Massie Kluver Referring Nanna Ertle: Treating Kenner Lewan/Extender: Arn Medal Weeks in Treatment: 8 Vital Signs Height(in): Pulse(bpm): 93 Weight(lbs): 116 Blood Pressure(mmHg): 144/81 Body Mass Index(BMI): Temperature(F): 98.3 Respiratory Rate(breaths/min): 16 [10:Photos:] Left Metatarsal head first Right Ischial Tuberosity Right Calcaneus Wound Location: Pressure Injury Pressure Injury Pressure Injury Wounding Event: Pressure Ulcer Pressure Ulcer Pressure Ulcer Primary Etiology: Cataracts, Lymphedema, Cataracts, Lymphedema, Cataracts, Lymphedema, Comorbid History: Hypertension, Peripheral Arterial Hypertension, Peripheral Arterial Hypertension, Peripheral Arterial Disease, Peripheral Venous Disease, Disease, Peripheral Venous Disease, Disease, Peripheral Venous Disease, Type II Diabetes,  Osteoarthritis, Type II Diabetes, Osteoarthritis, Type II Diabetes, Osteoarthritis, Neuropathy Neuropathy Neuropathy 10/23/2022 10/24/2022 07/23/2022 Date Acquired: '4 3 8 '$ Weeks of Treatment: Open Open Open Wound Status: No No No Wound Recurrence: 1.5x1.7x0.1 1.5x2.8x0.1 3x2.7x0.1 Measurements L x W x D (cm) 2.003 3.299 6.362 A (cm) : rea 0.2 0.33 0.636 Volume (cm) : 49.00% -59.10% 32.50% % Reduction in A rea: 74.50% -59.40% 77.50% % Reduction in Volume: N/A N/A N/A Undermining: Category/Stage IV Unstageable/Unclassified Unstageable/Unclassified Classification: Large None Present Medium Exudate A mount: Serosanguineous N/A Serosanguineous Exudate Type: red, brown N/A red, brown Exudate Color: No N/A Yes Foul Odor A Cleansing: fter N/A N/A No Odor A nticipated Due to Product Use: Flat and Intact Flat and Intact N/A Wound Margin: None Present (0%) N/A None Present (0%) Granulation A mount: Medium (34-66%) N/A Large (67-100%) Necrotic A mount: Necrosis of Bone Eschar Eschar Necrotic Tissue: Bone: Yes Fascia: No Fascia: No Exposed Structures: Fascia: No Fat Layer (Subcutaneous Tissue): No Fat Layer (Subcutaneous Tissue): No Fat Layer (Subcutaneous Tissue): No Tendon: No Tendon: No Tendon: No Muscle: No Muscle: No Muscle: No Joint: No Joint: No Joint: No Bone: No Bone: No None None None Epithelialization: Wound Number: 8 N/A N/A Photos: N/A N/A Gawron, Marjarie L ([id number]) [id number] Page 6 of 17 Left, Distal Gluteus N/A N/A Wound Location: Pressure Injury N/A N/A Wounding Event: Pressure Ulcer N/A N/A Primary Etiology: Cataracts, Lymphedema, N/A N/A Comorbid History: Hypertension, Peripheral Arterial Disease, Peripheral Venous Disease, Type II Diabetes, Osteoarthritis, Neuropathy 07/23/2022 N/A N/A Date Acquired: 8 N/A N/A Weeks of Treatment: Open N/A N/A Wound Status: No N/A N/A Wound  Recurrence: 1.6x2.7x0.9 N/A N/A Measurements L x W x D (cm) 3.393 N/A N/A A (cm) : rea 3.054 N/A N/A Volume (cm) : 38.30% N/A N/A % Reduction in A rea: -85.20% N/A N/A % Reduction in Volume: 10 Starting Position 1 (o'clock): 5 Ending Position 1 (o'clock): 1.5 Maximum Distance 1 (cm): Yes N/A N/A Undermining: Unstageable/Unclassified N/A N/A Classification: Medium N/A N/A Exudate A mount: Serosanguineous N/A N/A Exudate Type: red, brown N/A N/A Exudate Color: Yes N/A N/A Foul Odor A Cleansing: fter No N/A N/A Odor A nticipated Due to Product Use: N/A N/A N/A Wound Margin: None Present (0%) N/A N/A Granulation A mount: Large (67-100%) N/A N/A Necrotic A mount: Adherent Slough N/A N/A Necrotic Tissue: Fascia: No N/A N/A Exposed Structures: Fat Layer (Subcutaneous Tissue): No Tendon: No Muscle: No Joint: No Bone: No None N/A N/A Epithelialization: Treatment Notes Wound #10 (Metatarsal head first) Wound Laterality: Left Cleanser Dakin 16 (oz) 0.25 Discharge Instruction: Use as directed. Soap and Water Discharge Instruction: Gently cleanse wound with  antibacterial soap, rinse and pat dry prior to dressing wounds Peri-Wound Care Topical Primary Dressing Honey: Activon Honey Gel, 25 (g) Tube Hydrofera Blue Ready Transfer Foam, 2.5x2.5 (in/in) Discharge Instruction: Apply Hydrofera Blue Ready to wound bed as directed Secondary Dressing ABD Pad 5x9 (in/in) Discharge Instruction: Cover with ABD pad Secured With [place] Soft Cloth Surgical T ape ape, 2x2 (in/yd) Compression Wrap Compression Stockings Add-Ons Wound #11 (Ischial Tuberosity) Wound Laterality: Right Cleanser Georg, Breeanna L ([id number]) [id number].pdf Page 7 of 17 Dakin 16 (oz) 0.25 Discharge Instruction: Use as directed. Soap and Water Discharge Instruction: Gently cleanse wound with antibacterial soap, rinse and pat dry prior to dressing  wounds Peri-Wound Care Topical Primary Dressing Honey: Activon Honey Gel, 25 (g) Tube Secondary Dressing Hydrofera Blue Ready Transfer Foam, 2.5x2.5 (in/in) Discharge Instruction: Apply to wound bed over non-stick dressing. (BORDER) Zetuvit Plus SILICONE BORDER Dressing 5x5 (in/in) Discharge Instruction: Please do not put silicone bordered dressings under wraps. Use non-bordered dressing only. Secured With Medipore T - 61M Medipore H Soft Cloth Surgical T ape ape, 2x2 (in/yd) Compression Wrap Compression Stockings Add-Ons Wound #6 (Calcaneus) Wound Laterality: Right Cleanser [place]ram Ancillary Kit - 15 Day Supply Discharge Instruction: Use supplies as instructed; Kit contains: (15) Saline Bullets; (15) 3x3 Gauze; 15 pr Gloves Dakin 16 (oz) 0.25 Discharge Instruction: Use as directed. Soap and Water Discharge Instruction: Gently cleanse wound with antibacterial soap, rinse and pat dry prior to dressing wounds Peri-Wound Care Topical Activon Honey Gel, 25 (g) Tube Primary Dressing Hydrofera Blue Ready Transfer Foam, 2.5x2.5 (in/in) Discharge Instruction: Apply Hydrofera Blue Ready to wound bed as directed Secondary Dressing ABD Pad 5x9 (in/in) Discharge Instruction: Cover with ABD pad Kerlix 4.5 x 4.1 (in/yd) Discharge Instruction: Apply Kerlix 4.5 x 4.1 (in/yd) as instructed Secured With Medipore T - 61M Medipore H Soft Cloth Surgical T ape ape, 2x2 (in/yd) Compression Wrap Compression Stockings Add-Ons Wound #8 (Gluteus) Wound Laterality: Left, Distal Cleanser Dakin 16 (oz) 0.25 Discharge Instruction: Use as directed. Soap and Water Discharge Instruction: Gently cleanse wound with antibacterial soap, rinse and pat dry prior to dressing wounds Peri-Wound Care Topical Primary Dressing Gauze Discharge Instruction: moistened with [place], Juliza L ([id number]) [phone].pdf Page 8 of 17 Secondary Dressing ABD Pad 5x9  (in/in) Discharge Instruction: Cover with ABD pad Secured With Medipore T - 61M Medipore H Soft Cloth Surgical T ape ape, 2x2 (in/yd) Compression Wrap Compression Stockings Add-Ons Electronic Signature(s) Signed: 11/22/2022 5:12:34 PM By: Rosalio Loud MSN RN CNS WTA Previous Signature: 11/21/2022 5:10:32 PM Version By: Massie Kluver Entered By: Rosalio Loud on 11/22/2022 17:12:34 -------------------------------------------------------------------------------- Multi-Disciplinary Care Plan Details Patient Name: Date of Service: [facility], [name] 11/21/2022 2:45 PM Medical Record Number: [id number] Patient Account Number: [id number] Date of Birth/Sex: Treating RN: May 03, 1941 (82 y.o. [name] Primary Care Yukie Bergeron: Tracie Harrier Other Clinician: Massie Kluver Referring Kennie Karapetian: Treating Thorvald Orsino/Extender: Arn Medal Weeks in Treatment: 8 Active Inactive Necrotic Tissue Nursing Diagnoses: Impaired tissue integrity related to necrotic/devitalized tissue Knowledge deficit related to management of necrotic/devitalized tissue Goals: Necrotic/devitalized tissue will be minimized in the wound bed Date Initiated: 10/31/2022 Target Resolution Date: 11/07/2022 Goal Status: Active Patient/caregiver will verbalize understanding of reason and process for debridement of necrotic tissue Date Initiated: 10/31/2022 Target Resolution Date: 10/31/2022 Goal Status: Active Interventions: Assess patient pain level pre-, during and post procedure and prior to discharge Provide education on necrotic tissue and debridement process Treatment Activities: Apply topical anesthetic as ordered :  10/31/2022 Biologic debridement : 10/31/2022 Notes: Osteomyelitis Nursing Diagnoses: Potential for infection: osteomyelitis Goals: Patient/caregiver will verbalize understanding of disease process and disease management MYNA, FREIMARK L ([id number])  [id number].pdf Page 9 of 17 Date Initiated: 10/31/2022 Target Resolution Date: 10/31/2022 Goal Status: Active Signs and symptoms for osteomyelitis will be recognized and promptly addressed Date Initiated: 10/31/2022 Target Resolution Date: 10/31/2022 Goal Status: Active Interventions: Assess for signs and symptoms of osteomyelitis resolution every visit Treatment Activities: Systemic antibiotics : 10/31/2022 Notes: Soft Tissue Infection Nursing Diagnoses: Impaired tissue integrity Potential for infection: soft tissue Goals: Patient/caregiver will verbalize understanding of or measures to prevent infection and contamination in the home setting Date Initiated: 10/31/2022 Target Resolution Date: 10/31/2022 Goal Status: Active Signs and symptoms of infection will be recognized early to allow for prompt treatment Date Initiated: 10/31/2022 Target Resolution Date: 10/31/2022 Goal Status: Active Interventions: Assess signs and symptoms of infection every visit Notes: Wound/Skin Impairment Nursing Diagnoses: Impaired tissue integrity Goals: Patient/caregiver will verbalize understanding of skin care regimen Date Initiated: 10/31/2022 Date Inactivated: 11/06/2022 Target Resolution Date: 10/31/2022 Goal Status: Met Ulcer/skin breakdown will have a volume reduction of 30% by week 4 Date Initiated: 10/31/2022 Date Inactivated: 11/06/2022 Target Resolution Date: 10/31/2022 Unmet Reason: comorbidities, failure to Goal Status: Unmet thrive Ulcer/skin breakdown will have a volume reduction of 50% by week 8 Date Initiated: 11/06/2022 Target Resolution Date: 11/28/2022 Goal Status: Active Interventions: Assess patient/caregiver ability to perform ulcer/skin care regimen upon admission and as needed Assess ulceration(s) every visit Provide education on ulcer and skin care Treatment Activities: Skin care regimen initiated : 10/31/2022 Topical wound management  initiated : 10/31/2022 Notes: Electronic Signature(s) Signed: 11/21/2022 5:10:32 PM By: Massie Kluver Signed: 11/22/2022 3:17:21 PM By: Gretta Cool, BSN, RN, CWS, Kim RN, BSN Entered By: Massie Kluver on 11/21/2022 17:09:10 Mccarn, [name] ([id number]) [id number] Page 10 of 17 -------------------------------------------------------------------------------- Pain Assessment Details Patient Name: Date of Service: [facility], [name] 11/21/2022 2:45 PM Medical Record Number: [id number] Patient Account Number: [id number] Date of Birth/Sex: Treating RN: 1941/07/10 (82 y.o. [name] Primary Care Dhruvi Crenshaw: Tracie Harrier Other Clinician: Massie Kluver Referring Zong Mcquarrie: Treating Sequan Auxier/Extender: Arn Medal Weeks in Treatment: 8 Active Problems Location of Pain Severity and Description of Pain Patient Has Paino Yes Site Locations Pain Location: Pain in Ulcers Duration of the Pain. Constant / Intermittento Constant Rate the pain. Current Pain Level: 7 Character of Pain Describe the Pain: Aching, Burning, Shooting Pain Management and Medication Current Pain Management: Medication: Yes Cold Application: No Rest: Yes Massage: No Activity: No T.E.N.S.: No Heat Application: No Leg drop or elevation: No Is the Current Pain Management Adequate: Inadequate How does your wound impact your activities of daily livingo Sleep: No Bathing: No Appetite: No Relationship With Others: No Bladder Continence: No Emotions: No Bowel Continence: No Work: No Toileting: No Drive: No Dressing: No Hobbies: No Electronic Signature(s) Signed: 11/21/2022 5:10:32 PM By: Massie Kluver Signed: 11/22/2022 3:17:21 PM By: Gretta Cool, BSN, RN, CWS, Kim RN, BSN Entered By: Massie Kluver on 11/21/2022 15:09:23 Casanas, [name] ([id number]) [id number] Page 11 of  17 -------------------------------------------------------------------------------- Patient/Caregiver Education Details Patient Name: Date of Service: [facility], [name] 1/10/2024andnbsp2:45 PM Medical Record Number: [id number] Patient Account Number: [id number] Date of Birth/Gender: Treating RN: [birth year] (82 y.o. [name] Primary Care Physician: Tracie Harrier Other Clinician: Massie Kluver Referring Physician: Treating Physician/Extender: Arn Medal Weeks in Treatment: 8 Education Assessment Education Provided To: Patient and Caregiver Education Topics Provided Offloading: Handouts: Other:  offload with Prevalon heel boots Methods: Explain/Verbal Responses: State content correctly Wound/Skin Impairment: Handouts: Other: continue wound care as directed Methods: Explain/Verbal Responses: State content correctly Electronic Signature(s) Signed: 11/21/2022 5:10:32 PM By: Massie Kluver Entered By: Massie Kluver on 11/21/2022 17:09:05 -------------------------------------------------------------------------------- Wound Assessment Details Patient Name: Date of Service: [facility], [name] 11/21/2022 2:45 PM Medical Record Number: [id number] Patient Account Number: [id number] Date of Birth/Sex: Treating RN: 08-Dec-1940 (82 y.o. [name] Primary Care Terryann Verbeek: Tracie Harrier Other Clinician: Massie Kluver Referring Greer Koeppen: Treating Jettie Lazare/Extender: Arn Medal Weeks in Treatment: 8 Wound Status Wound Number: 10 Primary Pressure Ulcer Etiology: Wound Location: Left Metatarsal head first Wound Open Wounding Event: Pressure Injury Status: Date Acquired: 10/23/2022 Comorbid Cataracts, Lymphedema, Hypertension, Peripheral Arterial Disease, Weeks Of Treatment: 4 History: Peripheral Venous Disease, Type II Diabetes, Osteoarthritis, Clustered Wound: No Neuropathy Cancelliere, Zakiah L ([id number])  [id number] Page 12 of 17 Photos Wound Measurements Length: (cm) 1.5 Width: (cm) 1.7 Depth: (cm) 0.1 Area: (cm) 2.003 Volume: (cm) 0.2 % Reduction in Area: 49% % Reduction in Volume: 74.5% Epithelialization: None Wound Description Classification: Category/Stage IV Wound Margin: Flat and Intact Exudate Amount: Large Exudate Type: Serosanguineous Exudate Color: red, brown Foul Odor After Cleansing: No Slough/Fibrino No Wound Bed Granulation Amount: None Present (0%) Exposed Structure Necrotic Amount: Medium (34-66%) Fascia Exposed: No Necrotic Quality: Bone Fat Layer (Subcutaneous Tissue) Exposed: No Tendon Exposed: No Muscle Exposed: No Joint Exposed: No Bone Exposed: Yes Treatment Notes Wound #10 (Metatarsal head first) Wound Laterality: Left Cleanser Dakin 16 (oz) 0.25 Discharge Instruction: Use as directed. Soap and Water Discharge Instruction: Gently cleanse wound with antibacterial soap, rinse and pat dry prior to dressing wounds Peri-Wound Care Topical Primary Dressing Honey: Activon Honey Gel, 25 (g) Tube Hydrofera Blue Ready Transfer Foam, 2.5x2.5 (in/in) Discharge Instruction: Apply Hydrofera Blue Ready to wound bed as directed Secondary Dressing ABD Pad 5x9 (in/in) Discharge Instruction: Cover with ABD pad Secured With [employer] T ape ape, 2x2 (in/yd) Compression Wrap Compression Stockings Add-Ons Electronic Signature(s) Signed: 11/21/2022 5:10:32 PM By: Massie Kluver Signed: 11/22/2022 3:17:21 PM By: Gretta Cool, BSN, RN, CWS, Kim RN, BSN Entered By: Massie Kluver on 11/21/2022 15:33:03 [place], [name] ([id number]) [id number] Page 13 of 17 -------------------------------------------------------------------------------- Wound Assessment Details Patient Name: Date of Service: [facility], [name] 11/21/2022 2:45 PM Medical Record Number: [id number] Patient  Account Number: [id number] Date of Birth/Sex: Treating RN: [birth year] (82 y.o. Charolette Forward, Kim Primary Care Nickola Lenig: Tracie Harrier Other Clinician: Massie Kluver Referring Ben Sanz: Treating Eitan Doubleday/Extender: Arn Medal Weeks in Treatment: 8 Wound Status Wound Number: 11 Primary Pressure Ulcer Etiology: Wound Location: Right Ischial Tuberosity Wound Open Wounding Event: Pressure Injury Status: Date Acquired: 10/24/2022 Comorbid Cataracts, Lymphedema, Hypertension, Peripheral Arterial Disease, Weeks Of Treatment: 3 History: Peripheral Venous Disease, Type II Diabetes, Osteoarthritis, Clustered Wound: No Neuropathy Photos Wound Measurements Length: (cm) 1.5 Width: (cm) 2.8 Depth: (cm) 0.1 Area: (cm) 3.299 Volume: (cm) 0.33 % Reduction in Area: -59.1% % Reduction in Volume: -59.4% Epithelialization: None Wound Description Classification: Unstageable/Unclassified Wound Margin: Flat and Intact Exudate Amount: None Present Wound Bed Necrotic Amount: Medium (34-66%) Exposed Structure Necrotic Quality: Eschar Fascia Exposed: No Fat Layer (Subcutaneous Tissue) Exposed: No Tendon Exposed: No Muscle Exposed: No Joint Exposed: No Bone Exposed: No Treatment Notes Wound #11 (Ischial Tuberosity) Wound Laterality: Right Cleanser Dakin 16 (oz) 0.25 Discharge Instruction: Use as directed. Soap and Water Discharge Instruction: Gently cleanse wound with antibacterial soap,  rinse and pat dry prior to dressing wounds Rost, Jamee L ([id number]) [phone].pdf Page 14 of 17 Peri-Wound Care Topical Primary Dressing Honey: Activon Honey Gel, 25 (g) Tube Secondary Dressing Hydrofera Blue Ready Transfer Foam, 2.5x2.5 (in/in) Discharge Instruction: Apply to wound bed over non-stick dressing. (BORDER) Zetuvit Plus SILICONE BORDER Dressing 5x5 (in/in) Discharge Instruction: Please do not put silicone bordered dressings under wraps.  Use non-bordered dressing only. Secured With [employer] Soft Cloth Surgical T ape ape, 2x2 (in/yd) Compression Wrap Compression Stockings Add-Ons Electronic Signature(s) Signed: 11/21/2022 5:10:32 PM By: Massie Kluver Signed: 11/22/2022 3:17:21 PM By: Gretta Cool, BSN, RN, CWS, Kim RN, BSN Entered By: Massie Kluver on 11/21/2022 15:33:24 -------------------------------------------------------------------------------- Wound Assessment Details Patient Name: Date of Service: [facility], [name] 11/21/2022 2:45 PM Medical Record Number: [id number] Patient Account Number: [id number] Date of Birth/Sex: Treating RN: 07/03/1941 (82 y.o. Charolette Forward, Kim Primary Care Nyellie Yetter: Tracie Harrier Other Clinician: Massie Kluver Referring Chino Sardo: Treating Myron Lona/Extender: Arn Medal Weeks in Treatment: 8 Wound Status Wound Number: 6 Primary Pressure Ulcer Etiology: Wound Location: Right Calcaneus Wound Open Wounding Event: Pressure Injury Status: Date Acquired: 07/23/2022 Comorbid Cataracts, Lymphedema, Hypertension, Peripheral Arterial Disease, Weeks Of Treatment: 8 History: Peripheral Venous Disease, Type II Diabetes, Osteoarthritis, Clustered Wound: No Neuropathy Photos Wound Measurements Length: (cm) 3 Width: (cm) 2.7 Pontiff, Zen L ([id number]) Depth: (cm) 0.1 Area: (cm) 6.362 Volume: (cm) 0.636 % Reduction in Area: 32.5% % Reduction in Volume: 77.5% [id number] Page 15 of 17 Epithelialization: None Wound Description Classification: Unstageable/Unclassified Exudate Amount: Medium Exudate Type: Serosanguineous Exudate Color: red, brown Foul Odor After Cleansing: Yes Due to Product Use: No Slough/Fibrino Yes Wound Bed Granulation Amount: None Present (0%) Exposed Structure Necrotic Amount: Large (67-100%) Fascia Exposed: No Necrotic Quality: Eschar Fat Layer (Subcutaneous Tissue) Exposed: No Tendon  Exposed: No Muscle Exposed: No Joint Exposed: No Bone Exposed: No Treatment Notes Wound #6 (Calcaneus) Wound Laterality: Right Cleanser [place] Ancillary Kit - 15 Day Supply Discharge Instruction: Use supplies as instructed; Kit contains: (15) Saline Bullets; (15) 3x3 Gauze; 15 pr Gloves Dakin 16 (oz) 0.25 Discharge Instruction: Use as directed. Soap and Water Discharge Instruction: Gently cleanse wound with antibacterial soap, rinse and pat dry prior to dressing wounds Peri-Wound Care Topical Activon Honey Gel, 25 (g) Tube Primary Dressing Hydrofera Blue Ready Transfer Foam, 2.5x2.5 (in/in) Discharge Instruction: Apply Hydrofera Blue Ready to wound bed as directed Secondary Dressing ABD Pad 5x9 (in/in) Discharge Instruction: Cover with ABD pad Kerlix 4.5 x 4.1 (in/yd) Discharge Instruction: Apply Kerlix 4.5 x 4.1 (in/yd) as instructed Secured With [employer] H Soft Cloth Surgical T ape ape, 2x2 (in/yd) Compression Wrap Compression Stockings Add-Ons Electronic Signature(s) Signed: 11/21/2022 5:10:32 PM By: Massie Kluver Signed: 11/22/2022 3:17:21 PM By: Gretta Cool, BSN, RN, CWS, Kim RN, BSN Entered By: Massie Kluver on 11/21/2022 15:33:47 Wound Assessment Details -------------------------------------------------------------------------------- [name] ([id number]) [phone].pdf Page 16 of 17 Patient Name: Date of Service: [facility], [name] 11/21/2022 2:45 PM Medical Record Number: [id number] Patient Account Number: [id number] Date of Birth/Sex: Treating RN: 1941-02-18 (82 y.o. [name] Primary Care Raynald Rouillard: Tracie Harrier Other Clinician: Massie Kluver Referring Jah Alarid: Treating Lavera Vandermeer/Extender: Arn Medal Weeks in Treatment: 8 Wound Status Wound Number: 8 Primary Pressure Ulcer Etiology: Wound Location: Left, Distal Gluteus Wound Open Wounding Event: Pressure Injury Status: Date  Acquired: 07/23/2022 Comorbid Cataracts, Lymphedema, Hypertension, Peripheral Arterial Disease, Weeks Of Treatment: 8 History: Peripheral Venous Disease, Type II  Diabetes, Osteoarthritis, Clustered Wound: No Neuropathy Photos Wound Measurements Length: (cm) 1.6 % Reduction in Area: 38.3% Width: (cm) 2.7 % Reduction in Volume: -85.2% Depth: (cm) 0.9 Epithelialization: None Area: (cm) 3.393 Tunneling: No Volume: (cm) 3.054 Undermining: Yes Starting Position (o'clock): 10 Ending Position (o'clock): 5 Maximum Distance: (cm) 1.5 Wound Description Classification: Unstageable/Unclassified Foul Odor After Cleansing: Yes Exudate Amount: Medium Due to Product Use: No Exudate Type: Serosanguineous Slough/Fibrino Yes Exudate Color: red, brown Wound Bed Granulation Amount: None Present (0%) Exposed Structure Necrotic Amount: Large (67-100%) Fascia Exposed: No Necrotic Quality: Adherent Slough Fat Layer (Subcutaneous Tissue) Exposed: No Tendon Exposed: No Muscle Exposed: No Joint Exposed: No Bone Exposed: No Treatment Notes Wound #8 (Gluteus) Wound Laterality: Left, Distal Cleanser Dakin 16 (oz) 0.25 Discharge Instruction: Use as directed. Soap and Water Discharge Instruction: Gently cleanse wound with antibacterial soap, rinse and pat dry prior to dressing wounds Peri-Wound Care Topical Primary Dressing Gauze Discharge Instruction: moistened with Tutwiler, Ellanor L ([id number]) [phone].pdf Page 17 of 17 Secondary Dressing ABD Pad 5x9 (in/in) Discharge Instruction: Cover with ABD pad Secured With [employer] T - 8M Medipore H Soft Cloth Surgical T ape ape, 2x2 (in/yd) Compression Wrap Compression Stockings Add-Ons Electronic Signature(s) Signed: 11/21/2022 5:10:32 PM By: Massie Kluver Signed: 11/22/2022 3:17:21 PM By: Gretta Cool, BSN, RN, CWS, Kim RN, BSN Entered By: Massie Kluver on 11/21/2022  15:34:07 -------------------------------------------------------------------------------- Vitals Details Patient Name: Date of Service: [facility], [name] 11/21/2022 2:45 PM Medical Record Number: [id number] Patient Account Number: [id number] Date of Birth/Sex: Treating RN: March 12, 1941 (82 y.o. Charolette Forward, Kim Primary Care Jatara Huettner: Tracie Harrier Other Clinician: Massie Kluver Referring Mikel Pyon: Treating Hazelene Doten/Extender: Arn Medal Weeks in Treatment: 8 Vital Signs Time Taken: 15:05 Temperature (F): 98.3 Weight (lbs): 116 Pulse (bpm): 93 Respiratory Rate (breaths/min): 16 Blood Pressure (mmHg): 144/81 Reference Range: 80 - 120 mg / dl Electronic Signature(s) Signed: 11/21/2022 5:10:32 PM By: Massie Kluver Entered By: Massie Kluver on 11/21/2022 15:08:55

## 2022-11-22 NOTE — Progress Notes (Addendum)
[name], [name] ([id number]) [id number] Page 1 of 9 Visit Report for 11/21/2022 Chief Complaint Document Details Patient Name: Date of Service: [name], [name]. 11/21/2022 2:45 PM Medical Record Number: [id number] Patient Account Number: [id number] Date of Birth/Sex: Treating RN: January 08, 1941 (82 y.o. [name] Primary Care Provider: Tracie Harrier Other Clinician: Massie Kluver Referring Provider: Treating Provider/Extender: Arn Medal Weeks in Treatment: 8 Information Obtained from: Patient Chief Complaint 09/26/2022; left lower extremity wound, right heel wound [name] left buttocks wound Electronic Signature(s) Signed: 11/21/2022 4:40:21 PM By: [name] Shan DO Entered By: [name] on 11/21/2022 16:07:39 -------------------------------------------------------------------------------- HPI Details Patient Name: Date of Service: [name], [name] 11/21/2022 2:45 PM Medical Record Number: [id number] Patient Account Number: [id number] Date of Birth/Sex: Treating RN: July 17, 1941 (82 y.o. [name] Primary Care Provider: Tracie Harrier Other Clinician: Massie Kluver Referring Provider: Treating Provider/Extender: Arn Medal Weeks in Treatment: 8 History of Present Illness HPI Description: 82 year old patient who comes with a referral for bilateral lower extremity edema [name] a lower extremity ulceration [name] has been sent by her PCP Dr. Placido Sou. I understand the patient was recently put on amoxicillin [name] doxycycline but could not tolerate the amoxicillin. doxycycline course was completed. a BNP [name] EKG was supposed to be normal [name] the patient did not have any dyspnea. the patient has been on a diuretic. The patient was also prescribed a pair of elastic compression stockings of the 20-30 mmHg pressure variety. x-ray of the right ankle was done on 09/20/2015 [name] showed  posttraumatic [name] postsurgical changes of the right ankle with secondary degenerative changes of the tibiotalar joint [name] to a lesser degree the subtalar joint. No definite acute bony abnormalities are noted. Past medical history significant for diabetes mellitus, hypertension, hyperlipidemia, right breast cancer treated with a mastectomy in 2014. She has never smoked. 10/24/2015 -- she had delayed her vascular test because of her husband surgery but she is now ready to get him taken care of. He is also unable to use compression stockings [name] hence we will need to order her Juzo wraps. 10/31/2015-- was seen by Dr. Lucky Cowboy on 10/28/2015. She had a left lower extremity arterial duplex done at his office a couple of years ago [name] that was essentially normal. Today they performed a venous duplex which revealed no evidence of deep vein thrombosis, superficial thrombophlebitis, no venous reflex was seen on the right [name] a minimal amount of reflux was seen on the left great saphenous vein but no significant reflux was seen. Impression was that there was a component of lymphedema present from a previous surgery [name] he would recommend compression stockings [name] leg elevation. ABRI, VACCA ([id number]) [id number] Page 2 of 9 Readmission: 07-24-2022 upon evaluation today patient presents for initial inspection here in our clinic concerning issues that she has been having with her legs this is actually been going on for several years according to what her family member with her today tells me as well as what the patient reiterates as well. She is currently most recently been seeing Dr. Vickki [name] [name] subsequently he had her in [place] boots. However 2 weeks ago he referred her to [place] [name] then subsequently took her out of the [place] boot wraps at that point. At this time the left leg looks to be worse in the right leg currently. She is on Lasix [name] lisinopril with hydrochlorothiazide she has  high blood pressure she also has issues currently with lower extremity swelling  [name] edema which has been an ongoing issue for her as well. Patient does have a history of chronic venous hypertension, lymphedema, diabetes mellitus type 2, hypertension, peripheral vascular disease, [name] neuropathy. Currently she is on Lasix as well as lisinopril with hydrochlorothiazide. 08-02-2022 upon evaluation today patient presents for reevaluation the good news is she is actually doing somewhat better in regard to the wound [name] the overall appearance [name] sinuses. The unfortunate thing is her infection really is not significantly improved we did have to switch out her antibiotic once we got that final result back [name] I switched her to Levaquin [name] away from the doxycycline. Unfortunately the doxycycline had been doing poorly for her. In fact she had had diarrhea from the time she started taking it on Friday [name] she is still having it when she shows up today for evaluation. Again I was not aware of this [name] obviously she does appear to be somewhat dehydrated as well based on what I see. My concern which I discussed with the patient today is the possibility of a C. difficile infection. With that being said fact this started immediately upon taking the doxycycline makes me think that it was just the medicine [name] is not completely out of her system yet despite having taken the last dose Tuesday morning. Nonetheless with what we are seeing currently I want to be sure that reason I Minna contact her primary care provider [name] see if a would be willing to see her [name] test for C. difficile infection. 08-07-2022 upon evaluation today patient appears to be doing well currently in regard to her wounds which are actually measuring much better this is great news. Fortunately I do not see any signs of active infection locally or systemically at this time which is great as well. The good news is she was tested for a C. difficile  infection [name] it was negative. I am very pleased [name] thankful for primary care provider for doing that so this means that she was just having a severe reaction to the doxycycline we have added that to her allergy list at this point. 08-14-2022 upon evaluation today patient appears to be doing well currently in regard to her dehydration she is actually significantly improved compared to last time I saw her last week. With that being said I do not see any evidence of active infection systemically at this time which is great news. However locally she still does appear to have cellulitis in left lower extremity. I discussed with her today that I do believe she would benefit from going ahead [name] starting the [place]. Again we will concerned about the C. difficile infection of the diarrhea but that this turned out to be just an issue with a reaction to the doxycycline. My hope is that the Levaquin will not cause her any complication [name] will be able to treat the infection. I did review her arterial study as well which was dated on 07-31-2022. It showed that she had a ABI on the right of 1.30 [name] on the left of 1.27 with a TBI on the right of 1.12 [name] the left 1.21 this is a normal arterial study. Again on the patient's wound culture she actually did show evidence of Proteus, Morganella, [name] MRSA. Levaquin is a good option here across the board. 09/26/2022 Ms. Diane Breach is a 82 year old female with a past medical history of type 2 diabetes currently controlled on oral agents, venous insufficiency/lymphedema, right breast cancer [name] chronic diastolic heart failure  that presents to the clinic for a 1 month history of nonhealing wounds to the left lower extremity, right heel [name] left buttocks. She states that the buttocks wound [name] the right heel wound developed while she was in the [name]. She was admitted on 08/22/2022 for severe sepsis secondary to acute sigmoid [name] distal colonic diverticulitis. At  that time it was noted she had a stage I decubitus ulcer to her bilateral buttocks. A wound to the heel was not mentioned. She currently denies systemic signs of infection. She came into clinic in a blue gown with no undergarments. She has not been dressing the wounds. She states she just recently obtained home health [name] they are coming out for the first time this week. She is seen in our clinic often for lower extremity wounds secondary to venous insufficiency. 11/22; patient presents for follow-up. Son is present during the encounter. Per son it sounds like they are not doing any dressing changes. She states she has home health but they have not come out. She is going to let us know which home health agency she has been approved for so we can send orders. Currently she denies signs of infection. 12/13; patient presents for follow-up. Patient has home health [name] they are coming out once a week. It appears that there has not been any dressing changes except for with home health. Patient has a newly discovered wound to the left foot. There is exposed bone. There is slight erythema [name] increased warmth to the surrounding tissue. Patient is completely unaware of this wound. 12/20; patient presents for follow-up. She has been taking her oral antibiotics. She now has an eschar [name] wound to the right hip. She has been using Dakin's wet-to-dry dressings to the left foot wound [name] buttocks wound. She has been using Medihoney [name] silver alginate to the right heel wound. She currently denies systemic signs of infection. She is mainly bedbound or in a wheelchair. 1/10; patient presents for follow-up. Patient had her left second toenail removed by Dr. Vickki [name], podiatry on 12/21. Unfortunately she did not feel well [name] she was admitted to the [name] for sepsis. Her right heel wound was thought to be infected [name] this was debrided in the OR by Dr. Vickki [name]. Culture [name] bone biopsy had no growth. She has been using  Medihoney to all the wound beds except for the lefty buttocks wound for which she uses Dakin's wet-to-dry dressings. She has developed a pressure injury to the left heel now. This is despite having Prevalon boots. Although she is not wearing them today. She has completed 4 weeks of Augmentin [name] also a week of doxycycline for osteomyelitis of the left foot. Her left foot wound no longer probes to bone. Currently she denies signs of infection. Electronic Signature(s) Signed: 11/22/2022 5:13:05 PM By: Rosalio Loud MSN RN CNS WTA Signed: 11/23/2022 11:36:37 AM By: [name] Shan DO Previous Signature: 11/21/2022 4:40:21 PM Version By: [name] Shan DO Entered By: Rosalio Loud on 11/22/2022 17:13:05 -------------------------------------------------------------------------------- Physical Exam Details Patient Name: Date of Service: [facility], [name] 11/21/2022 2:45 PM Medical Record Number: [id number] Patient Account Number: [id number] AMIERA, HERZBERG ([id number]) [id number] Page 3 of 9 Date of Birth/Sex: Treating RN: [birth year] (82 y.o. [name] Primary Care Provider: Tracie Harrier Other Clinician: Massie Kluver Referring Provider: Treating Provider/Extender: Arn Medal Weeks in Treatment: 8 Constitutional . Cardiovascular . Psychiatric . Notes T the right heel there is an open wound with nonviable tissue [name] mostly  granulation tissue. T the left buttocks there is a large open wound with granulation o o tissue. T the left foot first metatarsal there is an open wound with granulation tissue [name] scant non viable tissue. T the right hip there is a wound with non o o viable throughout. T the left heel there is evidence of a deep tissue pressure injury. No open wound. No signs of overt acute infection. o Electronic Signature(s) Signed: 11/22/2022 5:13:11 PM By: Rosalio Loud MSN RN CNS WTA Signed: 11/23/2022 11:36:37 AM  By: [name] Shan DO Previous Signature: 11/21/2022 4:40:21 PM Version By: [name] Shan DO Entered By: Rosalio Loud on 11/22/2022 17:13:11 -------------------------------------------------------------------------------- Physician Orders Details Patient Name: Date of Service: [facility], [name] 11/21/2022 2:45 PM Medical Record Number: [id number] Patient Account Number: [id number] Date of Birth/Sex: Treating RN: [birth year] (82 y.o. [name] Primary Care Provider: Tracie Harrier Other Clinician: Massie Kluver Referring Provider: Treating Provider/Extender: Arn Medal Weeks in Treatment: 8 Verbal / Phone Orders: No Diagnosis Coding Follow-up Appointments Return Appointment in 1 week. [place] for wound care. May utilize formulary equivalent dressing for wound treatment orders unless otherwise specified. Home Health Nurse may visit PRN to address patients wound care needs. Alvis Lemmings [phone] Scheduled days for dressing changes to be completed; exception, patient has scheduled wound care visit that day. - Home health to change once weekly [name] son will change dressing once weekly, patient to be seen on Wednesday's in office Bathing/ Shower/ Hygiene Wash wounds with antibacterial soap [name] water. No tub bath. Anesthetic (Use 'Patient Medications' Section for Anesthetic Order Entry) Lidocaine applied to wound bed Off-Loading Other: - wear prevalon heel boots at all times Wound Treatment Wound #10 - Metatarsal head first Wound Laterality: Left Cleanser: Dakin 16 (oz) 0.25 (Generic) 3 x Per Week/30 Days Discharge Instructions: Use as directed. Cleanser: Soap [name] Water 3 x Per Week/30 Days TYKIRA, WACHS [name] ([id number]) [id number] Page 4 of 9 Discharge Instructions: Gently cleanse wound with antibacterial soap, rinse [name] pat dry prior to dressing wounds Prim Dressing: Honey: Activon Honey Gel,  25 (g) Tube ary 3 x Per Week/30 Days Prim Dressing: Hydrofera Blue Ready Transfer Foam, 2.5x2.5 (in/in) (DME) (Dispense As Written) 3 x Per Week/30 Days ary Discharge Instructions: Apply Hydrofera Blue Ready to wound bed as directed Secondary Dressing: ABD Pad 5x9 (in/in) 3 x Per Week/30 Days Discharge Instructions: Cover with ABD pad Secured With: Medipore T - 59M Medipore H Soft Cloth Surgical T ape ape, 2x2 (in/yd) 3 x Per Week/30 Days Wound #11 - Ischial Tuberosity Wound Laterality: Right Cleanser: Dakin 16 (oz) 0.25 (Generic) 1 x Per Day/30 Days Discharge Instructions: Use as directed. Cleanser: Soap [name] Water 1 x Per Day/30 Days Discharge Instructions: Gently cleanse wound with antibacterial soap, rinse [name] pat dry prior to dressing wounds Prim Dressing: Honey: Activon Honey Gel, 25 (g) Tube ary 1 x Per Day/30 Days Secondary Dressing: Hydrofera Blue Ready Transfer Foam, 2.5x2.5 (in/in) 1 x Per Day/30 Days Discharge Instructions: Apply to wound bed over non-stick dressing. Secondary Dressing: (BORDER) Zetuvit Plus SILICONE BORDER Dressing 5x5 (in/in) (DME) (Generic) 1 x Per Day/30 Days Discharge Instructions: Please do not put silicone bordered dressings under wraps. Use non-bordered dressing only. Secured With: Medipore T - 59M Medipore H Soft Cloth Surgical T ape ape, 2x2 (in/yd) (DME) (Dispense As Written) 1 x Per Day/30 Days Wound #6 - Calcaneus Wound Laterality: Right Cleanser: [place]ram Ancillary Kit - 15 Day Supply (Generic) 3  x Per Week/30 Days Discharge Instructions: Use supplies as instructed; Kit contains: (15) Saline Bullets; (15) 3x3 Gauze; 15 pr Gloves Cleanser: Dakin 16 (oz) 0.25 (Dispense As Written) 3 x Per Week/30 Days Discharge Instructions: Use as directed. Cleanser: Soap [name] Water 3 x Per Week/30 Days Discharge Instructions: Gently cleanse wound with antibacterial soap, rinse [name] pat dry prior to dressing wounds Topical: Activon Honey Gel, 25 (g) Tube (Dispense As  Written) 3 x Per Week/30 Days Prim Dressing: Hydrofera Blue Ready Transfer Foam, 2.5x2.5 (in/in) 3 x Per Week/30 Days ary Discharge Instructions: Apply Hydrofera Blue Ready to wound bed as directed Secondary Dressing: ABD Pad 5x9 (in/in) 3 x Per Week/30 Days Discharge Instructions: Cover with ABD pad Secondary Dressing: Kerlix 4.5 x 4.1 (in/yd) 3 x Per Week/30 Days Discharge Instructions: Apply Kerlix 4.5 x 4.1 (in/yd) as instructed Secured With: Medipore T - 912M Medipore H Soft Cloth Surgical T ape ape, 2x2 (in/yd) 3 x Per Week/30 Days Wound #8 - Gluteus Wound Laterality: Left, Distal Cleanser: Dakin 16 (oz) 0.25 (Generic) 1 x Per Day/30 Days Discharge Instructions: Use as directed. Cleanser: Soap [name] Water 1 x Per Day/30 Days Discharge Instructions: Gently cleanse wound with antibacterial soap, rinse [name] pat dry prior to dressing wounds Prim Dressing: Gauze 1 x Per Day/30 Days ary Discharge Instructions: moistened with Dakins Solution for wet to dry dressing Secondary Dressing: ABD Pad 5x9 (in/in) 1 x Per Day/30 Days Discharge Instructions: Cover with ABD pad Secured With: Medipore T - 912M Medipore H Soft Cloth Surgical T ape ape, 2x2 (in/yd) 1 x Per Day/30 Days Electronic Signature(s) Signed: 11/23/2022 11:36:37 AM By: [name] Shan DO Signed: 11/23/2022 1:23:19 PM By: Massie Kluver Previous Signature: 11/21/2022 4:40:21 PM Version By: [name] Shan DO Entered By: Massie Kluver on 11/22/2022 11:51:04 Petrea, [name] ([id number]) [id number] Page 5 of 9 -------------------------------------------------------------------------------- Problem List Details Patient Name: Date of Service: [employer] [name] [employer], [name] 11/21/2022 2:45 PM Medical Record Number: [id number] Patient Account Number: [id number] Date of Birth/Sex: Treating RN: [birth year] (82 y.o. [name] Primary Care Provider: Tracie Harrier Other Clinician: Massie Kluver Referring  Provider: Treating Provider/Extender: Arn Medal Weeks in Treatment: 8 Active Problems ICD-10 Encounter Code Description Active Date MDM Diagnosis [phone] Non-pressure chronic ulcer of other part of left lower leg with fat layer 09/26/2022 No Yes exposed I87.312 Chronic venous hypertension (idiopathic) with ulcer of left lower extremity 09/26/2022 No Yes I89.0 Lymphedema, not elsewhere classified 09/26/2022 No Yes E11.622 Type 2 diabetes mellitus with other skin ulcer 09/26/2022 No Yes Z66.06 Chronic diastolic (congestive) heart failure 09/26/2022 No Yes L89.320 Pressure ulcer of left buttock, unstageable 09/26/2022 No Yes L89.610 Pressure ulcer of right heel, unstageable 09/26/2022 No Yes S91.302A Unspecified open wound, left foot, initial encounter 10/24/2022 No Yes M86.172 Other acute osteomyelitis, left ankle [name] foot 10/24/2022 No Yes L89.626 Pressure-induced deep tissue damage of left heel 11/21/2022 No Yes L89.210 Pressure ulcer of right hip, unstageable 11/21/2022 No Yes Inactive Problems [name], [name] ([id number]) [id number] Page 6 of 9 Resolved Problems Electronic Signature(s) Signed: 11/21/2022 4:40:21 PM By: [name] Shan DO Entered By: [name] on 11/21/2022 16:21:17 -------------------------------------------------------------------------------- Progress Note Details Patient Name: Date of Service: [facility], [name] 11/21/2022 2:45 PM Medical Record Number: [id number] Patient Account Number: [id number] Date of Birth/Sex: Treating RN: Feb 04, 1941 (82 y.o. [name] Primary Care Provider: Tracie Harrier Other Clinician: Massie Kluver Referring Provider: Treating Provider/Extender: Arn Medal Weeks in Treatment: 8 Subjective Chief Complaint Information obtained  from Patient 09/26/2022; left lower extremity wound, right heel wound [name] left buttocks wound History of  Present Illness (HPI) 82 year old patient who comes with a referral for bilateral lower extremity edema [name] a lower extremity ulceration [name] has been sent by her PCP Dr. Placido Sou. I understand the patient was recently put on amoxicillin [name] doxycycline but could not tolerate the amoxicillin. doxycycline course was completed. a BNP [name] EKG was supposed to be normal [name] the patient did not have any dyspnea. the patient has been on a diuretic. The patient was also prescribed a pair of elastic compression stockings of the 20-30 mmHg pressure variety. x-ray of the right ankle was done on 09/20/2015 [name] showed posttraumatic [name] postsurgical changes of the right ankle with secondary degenerative changes of the tibiotalar joint [name] to a lesser degree the subtalar joint. No definite acute bony abnormalities are noted. Past medical history significant for diabetes mellitus, hypertension, hyperlipidemia, right breast cancer treated with a mastectomy in 2014. She has never smoked. 10/24/2015 -- she had delayed her vascular test because of her husband surgery but she is now ready to get him taken care of. He is also unable to use compression stockings [name] hence we will need to order her Juzo wraps. 10/31/2015-- was seen by Dr. Lucky Cowboy on 10/28/2015. She had a left lower extremity arterial duplex done at his office a couple of years ago [name] that was essentially normal. Today they performed a venous duplex which revealed no evidence of deep vein thrombosis, superficial thrombophlebitis, no venous reflex was seen on the right [name] a minimal amount of reflux was seen on the left great saphenous vein but no significant reflux was seen. Impression was that there was a component of lymphedema present from a previous surgery [name] he would recommend compression stockings [name] leg elevation. Readmission: 07-24-2022 upon evaluation today patient presents for initial inspection here in our clinic concerning issues that she has  been having with her legs this is actually been going on for several years according to what her family member with her today tells me as well as what the patient reiterates as well. She is currently most recently been seeing Dr. Vickki [name] [name] subsequently he had her in [place] boots. However 2 weeks ago he referred her to [place] [name] then subsequently took her out of the [place] boot wraps at that point. At this time the left leg looks to be worse in the right leg currently. She is on Lasix [name] lisinopril with hydrochlorothiazide she has high blood pressure she also has issues currently with lower extremity swelling [name] edema which has been an ongoing issue for her as well. Patient does have a history of chronic venous hypertension, lymphedema, diabetes mellitus type 2, hypertension, peripheral vascular disease, [name] neuropathy. Currently she is on Lasix as well as lisinopril with hydrochlorothiazide. 08-02-2022 upon evaluation today patient presents for reevaluation the good news is she is actually doing somewhat better in regard to the wound [name] the overall appearance [name] sinuses. The unfortunate thing is her infection really is not significantly improved we did have to switch out her antibiotic once we got that final result back [name] I switched her to Levaquin [name] away from the doxycycline. Unfortunately the doxycycline had been doing poorly for her. In fact she had had diarrhea from the time she started taking it on Friday [name] she is still having it when she shows up today for evaluation. Again I was not aware of this [name] obviously she does  appear to be somewhat dehydrated as well based on what I see. My concern which I discussed with the patient today is the possibility of a C. difficile infection. With that being said fact this started immediately upon taking the doxycycline makes me think that it was just the medicine [name] is not completely out of her system yet despite having taken the last dose Tuesday  morning. Nonetheless with what we are seeing currently I want to be sure that reason I Minna contact her primary care provider [name] see if a would be willing to see her [name] test for C. difficile infection. 08-07-2022 upon evaluation today patient appears to be doing well currently in regard to her wounds which are actually measuring much better this is great news. Fortunately I do not see any signs of active infection locally or systemically at this time which is great as well. The good news is she was tested for a C. difficile infection [name] it was negative. I am very pleased [name] thankful for primary care provider for doing that so this means that she was just having a severe reaction to the doxycycline we have added that to her allergy list at this point. 08-14-2022 upon evaluation today patient appears to be doing well currently in regard to her dehydration she is actually significantly improved compared to last time I saw her last week. With that being said I do not see any evidence of active infection systemically at this time which is great news. However locally she still does appear to have cellulitis in left lower extremity. I discussed with her today that I do believe she would benefit from going ahead [name] starting the Hope. Again we will concerned about the C. difficile infection of the diarrhea but that this turned out to be just an issue with a reaction to the doxycycline. KAILANY, DINUNZIO ([id number]) [id number] Page 7 of 9 My hope is that the Levaquin will not cause her any complication [name] will be able to treat the infection. I did review her arterial study as well which was dated on 07-31-2022. It showed that she had a ABI on the right of 1.30 [name] on the left of 1.27 with a TBI on the right of 1.12 [name] the left 1.21 this is a normal arterial study. Again on the patient's wound culture she actually did show evidence of Proteus, Morganella, [name] MRSA. Levaquin  is a good option here across the board. 09/26/2022 Ms. Diane Pfahler is a 82 year old female with a past medical history of type 2 diabetes currently controlled on oral agents, venous insufficiency/lymphedema, right breast cancer [name] chronic diastolic heart failure that presents to the clinic for a 1 month history of nonhealing wounds to the left lower extremity, right heel [name] left buttocks. She states that the buttocks wound [name] the right heel wound developed while she was in the [name]. She was admitted on 08/22/2022 for severe sepsis secondary to acute sigmoid [name] distal colonic diverticulitis. At that time it was noted she had a stage I decubitus ulcer to her bilateral buttocks. A wound to the heel was not mentioned. She currently denies systemic signs of infection. She came into clinic in a blue gown with no undergarments. She has not been dressing the wounds. She states she just recently obtained home health [name] they are coming out for the first time this week. She is seen in our clinic often for lower extremity wounds secondary to venous insufficiency. 11/22; patient presents for follow-up. Son  is present during the encounter. Per son it sounds like they are not doing any dressing changes. She states she has home health but they have not come out. She is going to let us know which home health agency she has been approved for so we can send orders. Currently she denies signs of infection. 12/13; patient presents for follow-up. Patient has home health [name] they are coming out once a week. It appears that there has not been any dressing changes except for with home health. Patient has a newly discovered wound to the left foot. There is exposed bone. There is slight erythema [name] increased warmth to the surrounding tissue. Patient is completely unaware of this wound. 12/20; patient presents for follow-up. She has been taking her oral antibiotics. She now has an eschar [name] wound to the right  hip. She has been using Dakin's wet-to-dry dressings to the left foot wound [name] buttocks wound. She has been using Medihoney [name] silver alginate to the right heel wound. She currently denies systemic signs of infection. She is mainly bedbound or in a wheelchair. 1/10; patient presents for follow-up. Patient had her left second toenail removed by Dr. Vickki [name], podiatry on 12/21. Unfortunately she did not feel well [name] she was admitted to the [name] for sepsis. Her right heel wound was thought to be infected [name] this was debrided in the OR by Dr. Vickki [name]. Culture [name] bone biopsy had no growth. She has been using Medihoney to all the wound beds except for the lefty buttocks wound for which she uses Dakin's wet-to-dry dressings. She has developed a pressure injury to the left heel now. This is despite having Prevalon boots. Although she is not wearing them today. She has completed 4 weeks of Augmentin [name] also a week of doxycycline for osteomyelitis of the left foot. Her left foot wound no longer probes to bone. Currently she denies signs of infection. Objective Constitutional Vitals Time Taken: 3:05 PM, Weight: 116 lbs, Temperature: 98.3 F, Pulse: 93 bpm, Respiratory Rate: 16 breaths/min, Blood Pressure: 144/81 mmHg. General Notes: T the right heel there is an open wound with nonviable tissue [name] mostly granulation tissue. T the left buttocks there is a large open wound o o with granulation tissue. T the left foot first metatarsal there is an open wound with granulation tissue [name] scant non viable tissue. T the right hip there is a o o wound with non viable throughout. T the left heel there is evidence of a deep tissue pressure injury. No open wound. No signs of overt acute infection. o Integumentary (Hair, Skin) Wound #10 status is Open. Original cause of wound was Pressure Injury. The date acquired was: 10/23/2022. The wound has been in treatment 4 weeks. The wound is located on the Left  Metatarsal head first. The wound measures 1.5cm length x 1.7cm width x 0.1cm depth; 2.003cm^2 area [name] 0.2cm^3 volume. There is bone exposed. There is a large amount of serosanguineous drainage noted. The wound margin is flat [name] intact. There is no granulation within the wound bed. There is a medium (34-66%) amount of necrotic tissue within the wound bed including Necrosis of Bone. Wound #11 status is Open. Original cause of wound was Pressure Injury. The date acquired was: 10/24/2022. The wound has been in treatment 3 weeks. The wound is located on the Right Ischial Tuberosity. The wound measures 1.5cm length x 2.8cm width x 0.1cm depth; 3.299cm^2 area [name] 0.33cm^3 volume. There is a [name] present amount of drainage noted. The  wound margin is flat [name] intact. There is a medium (34-66%) amount of necrotic tissue within the wound bed including Eschar. Wound #6 status is Open. Original cause of wound was Pressure Injury. The date acquired was: 07/23/2022. The wound has been in treatment 8 weeks. The wound is located on the Right Calcaneus. The wound measures 3cm length x 2.7cm width x 0.1cm depth; 6.362cm^2 area [name] 0.636cm^3 volume. There is a medium amount of serosanguineous drainage noted. Foul odor after cleansing was noted. There is no granulation within the wound bed. There is a large (67- 100%) amount of necrotic tissue within the wound bed including Eschar. Wound #8 status is Open. Original cause of wound was Pressure Injury. The date acquired was: 07/23/2022. The wound has been in treatment 8 weeks. The wound is located on the Left,Distal Gluteus. The wound measures 1.6cm length x 2.7cm width x 0.9cm depth; 3.393cm^2 area [name] 3.054cm^3 volume. There is no tunneling noted, however, there is undermining starting at 10:00 [name] ending at 5:00 with a maximum distance of 1.5cm. There is a medium amount of serosanguineous drainage noted. Foul odor after cleansing was noted. There is no granulation within  the wound bed. There is a large (67-100%) amount of necrotic tissue within the wound bed including Adherent Slough. Assessment Active Problems ICD-10 Non-pressure chronic ulcer of other part of left lower leg with fat layer exposed Chronic venous hypertension (idiopathic) with ulcer of left lower extremity Lymphedema, not elsewhere classified Type 2 diabetes mellitus with other skin ulcer Chronic diastolic (congestive) heart failure Pressure ulcer of left buttock, unstageable Pressure ulcer of right heel, unstageable Unspecified open wound, left foot, initial encounter Other acute osteomyelitis, left ankle [name] foot Rosenbach, Eve [name] ([id number]) [id number] Page 8 of 9 Pressure-induced deep tissue damage of left heel Pressure ulcer of right hip, unstageable Patient's wounds have shown improvement in size [name] appearance since last clinic visit. Her left foot wound no longer probes to bone. She completed 4 weeks of oral antibiotics for osteomyelitis of this site. Since her wound has improved I do not recommend any further oral antibiotics. She was recently admitted into the [name] for sepsis secondary to right heel infection. This was debrided in the OR by Dr. Vickki [name]. Bone culture [name] biopsy was negative for osteo [name] showed no growth. Unfortunately she has developed a deep pressure injury to the left heel now. I recommended aggressively offloading her heels with her Prevalon boots. She knows not to transfer with these. For all the wounds except for the left buttocks I recommended Medihoney [name] Hydrofera Blue. T the left buttocks wound I recommended continuing Dakin's wet-to-dry dressings. She needs to aggressively offload the wound beds. o This is a tough case due to her being wheelchair/bed bound. Follow-up in 2 weeks. Plan Follow-up Appointments: Return Appointment in 1 week. Home Health: [name] for wound care. May utilize formulary  equivalent dressing for wound treatment orders unless otherwise specified. Home Health Nurse may visit PRN to address patientoos wound care needs. Alvis Lemmings [phone] Bathing/ Shower/ Hygiene: Wash wounds with antibacterial soap [name] water. Off-Loading: Other: - prevalon heel boots WOUND #10: - Metatarsal head first Wound Laterality: Left Cleanser: Dakin 16 (oz) 0.25 (Generic) 3 x Per Week/30 Days Discharge Instructions: Use as directed. Cleanser: Soap [name] Water 3 x Per Week/30 Days Discharge Instructions: Gently cleanse wound with antibacterial soap, rinse [name] pat dry prior to dressing wounds Prim Dressing: Honey: Activon Honey Gel, 25 (g) Tube 3 x Per Week/30 Days ary  Prim Dressing: Hydrofera Blue Ready Transfer Foam, 2.5x2.5 (in/in) (DME) (Dispense As Written) 3 x Per Week/30 Days ary Discharge Instructions: Apply Hydrofera Blue Ready to wound bed as directed Secondary Dressing: ABD Pad 5x9 (in/in) 3 x Per Week/30 Days Discharge Instructions: Cover with ABD pad Secured With: Medipore T - 66M Medipore H Soft Cloth Surgical T ape ape, 2x2 (in/yd) 3 x Per Week/30 Days WOUND #11: - Ischial Tuberosity Wound Laterality: Right Cleanser: Dakin 16 (oz) 0.25 (Generic) 1 x Per Day/30 Days Discharge Instructions: Use as directed. Cleanser: Soap [name] Water 1 x Per Day/30 Days Discharge Instructions: Gently cleanse wound with antibacterial soap, rinse [name] pat dry prior to dressing wounds Prim Dressing: Honey: Activon Honey Gel, 25 (g) Tube 1 x Per Day/30 Days ary Secondary Dressing: Hydrofera Blue Ready Transfer Foam, 2.5x2.5 (in/in) 1 x Per Day/30 Days Discharge Instructions: Apply to wound bed over non-stick dressing. Secondary Dressing: (BORDER) Zetuvit Plus SILICONE BORDER Dressing 5x5 (in/in) (DME) (Generic) 1 x Per Day/30 Days Discharge Instructions: Please do not put silicone bordered dressings under wraps. Use non-bordered dressing only. Secured With: Medipore T - 66M Medipore H Soft Cloth  Surgical T ape ape, 2x2 (in/yd) (DME) (Dispense As Written) 1 x Per Day/30 Days WOUND #6: - Calcaneus Wound Laterality: Right Cleanser: [place]ram Ancillary Kit - 15 Day Supply (Generic) 3 x Per Week/30 Days Discharge Instructions: Use supplies as instructed; Kit contains: (15) Saline Bullets; (15) 3x3 Gauze; 15 pr Gloves Cleanser: Dakin 16 (oz) 0.25 (Dispense As Written) 3 x Per Week/30 Days Discharge Instructions: Use as directed. Cleanser: Soap [name] Water 3 x Per Week/30 Days Discharge Instructions: Gently cleanse wound with antibacterial soap, rinse [name] pat dry prior to dressing wounds Topical: Activon Honey Gel, 25 (g) Tube (Dispense As Written) 3 x Per Week/30 Days Prim Dressing: Hydrofera Blue Ready Transfer Foam, 2.5x2.5 (in/in) 3 x Per Week/30 Days ary Discharge Instructions: Apply Hydrofera Blue Ready to wound bed as directed Secondary Dressing: ABD Pad 5x9 (in/in) 3 x Per Week/30 Days Discharge Instructions: Cover with ABD pad Secondary Dressing: Kerlix 4.5 x 4.1 (in/yd) 3 x Per Week/30 Days Discharge Instructions: Apply Kerlix 4.5 x 4.1 (in/yd) as instructed Secured With: Medipore T - 66M Medipore H Soft Cloth Surgical T ape ape, 2x2 (in/yd) 3 x Per Week/30 Days WOUND #8: - Gluteus Wound Laterality: Left, Distal Cleanser: Dakin 16 (oz) 0.25 (Generic) 1 x Per Day/30 Days Discharge Instructions: Use as directed. Cleanser: Soap [name] Water 1 x Per Day/30 Days Discharge Instructions: Gently cleanse wound with antibacterial soap, rinse [name] pat dry prior to dressing wounds Prim Dressing: Gauze 1 x Per Day/30 Days ary Discharge Instructions: moistened with Dakins Solution Secondary Dressing: ABD Pad 5x9 (in/in) 1 x Per Day/30 Days Discharge Instructions: Cover with ABD pad Secured With: Medipore T - 66M Medipore H Soft Cloth Surgical T ape ape, 2x2 (in/yd) 1 x Per Day/30 Days 1. Dakin's wet-to-dry dressings 2. Medihoney 3. Aggressive offloading 4. Follow-up in 2 weeks Electronic  Signature(s) Signed: 11/21/2022 4:40:21 PM By: [name] Shan DO Entered By: [name] on 11/21/2022 16:21:30 Studzinski, [name] ([id number]) [id number] Page 9 of 9 -------------------------------------------------------------------------------- SuperBill Details Patient Name: Date of Service: [facility], [name] 11/21/2022 Medical Record Number: [id number] Patient Account Number: [id number] Date of Birth/Sex: Treating RN: 1940/11/21 (82 y.o. [name] Primary Care Provider: Tracie Harrier Other Clinician: Massie Kluver Referring Provider: Treating Provider/Extender: Arn Medal Weeks in Treatment: 8 Diagnosis Coding ICD-10 Codes Code Description [phone] Non-pressure  chronic ulcer of other part of left lower leg with fat layer exposed I87.312 Chronic venous hypertension (idiopathic) with ulcer of left lower extremity I89.0 Lymphedema, not elsewhere classified E11.622 Type 2 diabetes mellitus with other skin ulcer K95.74 Chronic diastolic (congestive) heart failure L89.320 Pressure ulcer of left buttock, unstageable L89.610 Pressure ulcer of right heel, unstageable S91.302A Unspecified open wound, left foot, initial encounter M86.172 Other acute osteomyelitis, left ankle [name] foot L89.626 Pressure-induced deep tissue damage of left heel Facility Procedures : CPT4 Code: [id number] Description: 64383 - WOUND CARE VISIT-LEV 5 EST PT Modifier: Quantity: 1 Physician Procedures : CPT4 Code Description Modifier [id number] - WC PHYS LEVEL 3 - EST PT ICD-10 Diagnosis Description L89.320 Pressure ulcer of left buttock, unstageable L89.610 Pressure ulcer of right heel, unstageable L89.626 Pressure-induced deep tissue damage of  left heel S91.302A Unspecified open wound, left foot, initial encounter Quantity: 1 Electronic Signature(s) Signed: 11/21/2022 4:40:21 PM By: [name] Shan DO Entered By: [name] on  11/21/2022 16:21:42

## 2022-11-23 ENCOUNTER — Ambulatory Visit (INDEPENDENT_AMBULATORY_CARE_PROVIDER_SITE_OTHER): Payer: PPO | Admitting: Nurse Practitioner

## 2022-11-26 DIAGNOSIS — L97519 Non-pressure chronic ulcer of other part of right foot with unspecified severity: Secondary | ICD-10-CM | POA: Diagnosis not present

## 2022-11-27 NOTE — Progress Notes (Deleted)
10/25/2022 4:21 PM   [name] Jan 02, 1941 [id number]  Referring provider: Tracie Harrier, MD [address] [facility] [place],  [place] [address]  Urological history: 1.  High risk hematuria -Non-smoker -History of microscopic hematuria with 4-10 RBC's -non-contrast CT (2019) -no malignancies -cysto (2019) -no malignancies -No complaints of gross heme -UA ***  2. Cystocele -seen on numerous CT's -appreciated on exam   Chief Complaint  Patient presents with   Urinary Retention    HPI: [name] is a 82 y.o. female who presents today for one month follow up.   She was admitted for sepsis since she was last seen by me and was found to be back in retention with 1000 L in the bladder.  She was started on Flomax during that admission.  The Foley was removed on December 28.  PVR ***  PMH: Past Medical History:  Diagnosis Date   Arthritis    Arthritis    Breast cancer ([place]) 1992   right breast with lumpectomy and rad tx   Cancer of right breast ([place]) 04/16/2013   right breast with mastectomy   Diabetes mellitus without complication ([place])    Gout    High cholesterol    Hyperlipidemia    Hypertension    Personal history of radiation therapy     Surgical History: Past Surgical History:  Procedure Laterality Date   ABDOMINAL HYSTERECTOMY     ANKLE FRACTURE SURGERY Right 2004   BREAST LUMPECTOMY Right 1992   positive   MASTECTOMY Right 2014   SHOULDER SURGERY Right 2010    Home Medications:  Allergies as of 10/25/2022       Reactions   Other Anaphylaxis   Anesthisia has been a health issue for her in the past.    Sulfa Antibiotics Rash        Medication List        Accurate as of October 25, 2022  4:21 PM. If you have any questions, ask your nurse or doctor.          acetaminophen 500 MG tablet Commonly known as: TYLENOL Take 500-1,000 mg by mouth 4 (four) times daily as needed for mild pain or moderate  pain.   allopurinol 300 MG tablet Commonly known as: ZYLOPRIM Take 300 mg by mouth daily.   atenolol 50 MG tablet Commonly known as: TENORMIN Take 50 mg by mouth daily.   atorvastatin 40 MG tablet Commonly known as: LIPITOR Take 40 mg by mouth daily.   cyanocobalamin 1000 MCG tablet Commonly known as: VITAMIN B12 Take 1,000 mcg by mouth daily.   diphenoxylate-atropine 2.5-0.025 MG tablet Commonly known as: LOMOTIL Take 1-2 tablets by mouth 3 (three) times daily as needed.   furosemide 40 MG tablet Commonly known as: LASIX Take 40 mg by mouth daily.   gentamicin cream 0.1 % Commonly known as: GARAMYCIN Apply 1 Application topically in the morning and at bedtime.   HYDROcodone-acetaminophen 7.5-325 MG tablet Commonly known as: NORCO Take 1 tablet by mouth 2 (two) times daily as needed for moderate pain or severe pain.   levothyroxine 88 MCG tablet Commonly known as: SYNTHROID Take 88 mcg by mouth daily.   lisinopril 5 MG tablet Commonly known as: ZESTRIL Take 5 mg by mouth daily.   metFORMIN 1000 MG tablet Commonly known as: GLUCOPHAGE Take 1,000 mg by mouth in the morning and at bedtime.   sitaGLIPtin 50 MG tablet Commonly known as: JANUVIA Take 50 mg by  mouth daily.        Allergies:  Allergies  Allergen Reactions   Other Anaphylaxis    Anesthisia has been a health issue for her in the past.    Sulfa Antibiotics Rash    Family History: Family History  Problem Relation Age of Onset   Diabetes Father    Breast cancer Neg Hx     Social History:  reports that she has never smoked. She has never been exposed to tobacco smoke. She has never used smokeless tobacco. She reports that she does not drink alcohol and does not use drugs.  ROS: Pertinent ROS in HPI  Physical Exam: There were no vitals taken for this visit.  Constitutional:  Well nourished. Alert and oriented, No acute distress. HEENT: [place] AT, moist mucus membranes.  Trachea midline, no  masses. Cardiovascular: No clubbing, cyanosis, or edema. Respiratory: Normal respiratory effort, no increased work of breathing. GU: No CVA tenderness.  No bladder fullness or masses. Vulvovaginal atrophy w/ pallor, loss of rugae, introital retraction, excoriations.  Vulvar thinning, fusion of labia, clitoral hood retraction, prominent urethral meatus.   *** external genitalia, *** pubic hair distribution, no lesions.  Normal urethral meatus, no lesions, no prolapse, no discharge.   No urethral masses, tenderness and/or tenderness. No bladder fullness, tenderness or masses. *** vagina mucosa, *** estrogen effect, no discharge, no lesions, *** pelvic support, *** cystocele and *** rectocele noted.  No cervical motion tenderness.  Uterus is freely mobile and non-fixed.  No adnexal/parametria masses or tenderness noted.  Anus and perineum are without rashes or lesions.   ***  Neurologic: Grossly intact, no focal deficits, moving all 4 extremities. Psychiatric: Normal mood and affect.     Laboratory Data:    Latest Ref Rng & Units 11/09/2022    5:19 AM 11/08/2022    1:32 PM 11/08/2022    4:56 AM  CBC  WBC 4.0 - 10.5 K/uL 10.5   11.5   Hemoglobin 12.0 - 15.0 g/dL 8.0  7.6  7.4   Hematocrit 36.0 - 46.0 % 26.4   24.6   Platelets 150 - 400 K/uL 328   320        Latest Ref Rng & Units 11/07/2022    6:01 AM 11/06/2022    1:30 PM 11/06/2022    5:32 AM  BMP  Glucose 70 - 99 mg/dL 123   119   BUN 8 - 23 mg/dL 16   16   Creatinine 0.44 - 1.00 mg/dL 0.79  0.77  0.80   Sodium 135 - 145 mmol/L 141   141   Potassium 3.5 - 5.1 mmol/L 3.8   3.6   Chloride 98 - 111 mmol/L 114   111   CO2 22 - 32 mmol/L 23   21   Calcium 8.9 - 10.3 mg/dL 7.7   7.8    I have reviewed the labs.    Pertinent Imaging: ***  Assessment & Plan:    1. Urinary retention -Resolved for the time being -reviewed return precautions   2. Microscopic hematuria -will recheck UA upon return in one month  Return in about 1  month (around 11/25/2022) for PVR and OAB questionnaire.  These notes generated with voice recognition software. I apologize for typographical errors.  [place], [place] [address]  [place], [place] [address] [phone]

## 2022-11-28 ENCOUNTER — Encounter: Payer: Self-pay | Admitting: Urology

## 2022-11-28 ENCOUNTER — Ambulatory Visit: Payer: PPO | Admitting: Internal Medicine

## 2022-11-28 ENCOUNTER — Ambulatory Visit: Payer: PPO | Admitting: Urology

## 2022-11-28 DIAGNOSIS — R339 Retention of urine, unspecified: Secondary | ICD-10-CM

## 2022-11-28 DIAGNOSIS — R3129 Other microscopic hematuria: Secondary | ICD-10-CM

## 2022-11-30 ENCOUNTER — Encounter (INDEPENDENT_AMBULATORY_CARE_PROVIDER_SITE_OTHER): Payer: Self-pay | Admitting: Nurse Practitioner

## 2022-11-30 ENCOUNTER — Ambulatory Visit (INDEPENDENT_AMBULATORY_CARE_PROVIDER_SITE_OTHER): Payer: PPO | Admitting: Nurse Practitioner

## 2022-11-30 VITALS — BP 135/81 | HR 94 | Ht 59.0 in | Wt 114.0 lb

## 2022-11-30 DIAGNOSIS — I82412 Acute embolism and thrombosis of left femoral vein: Secondary | ICD-10-CM | POA: Diagnosis not present

## 2022-11-30 DIAGNOSIS — I10 Essential (primary) hypertension: Secondary | ICD-10-CM | POA: Diagnosis not present

## 2022-12-01 DIAGNOSIS — M792 Neuralgia and neuritis, unspecified: Secondary | ICD-10-CM | POA: Diagnosis not present

## 2022-12-01 DIAGNOSIS — I890 Lymphedema, not elsewhere classified: Secondary | ICD-10-CM | POA: Diagnosis not present

## 2022-12-01 DIAGNOSIS — E7800 Pure hypercholesterolemia, unspecified: Secondary | ICD-10-CM | POA: Diagnosis not present

## 2022-12-01 DIAGNOSIS — M1A9XX0 Chronic gout, unspecified, without tophus (tophi): Secondary | ICD-10-CM | POA: Diagnosis not present

## 2022-12-01 DIAGNOSIS — L89892 Pressure ulcer of other site, stage 2: Secondary | ICD-10-CM | POA: Diagnosis not present

## 2022-12-01 DIAGNOSIS — G9341 Metabolic encephalopathy: Secondary | ICD-10-CM | POA: Diagnosis not present

## 2022-12-01 DIAGNOSIS — M8580 Other specified disorders of bone density and structure, unspecified site: Secondary | ICD-10-CM | POA: Diagnosis not present

## 2022-12-01 DIAGNOSIS — L89613 Pressure ulcer of right heel, stage 3: Secondary | ICD-10-CM | POA: Diagnosis not present

## 2022-12-01 DIAGNOSIS — E1140 Type 2 diabetes mellitus with diabetic neuropathy, unspecified: Secondary | ICD-10-CM | POA: Diagnosis not present

## 2022-12-01 DIAGNOSIS — I110 Hypertensive heart disease with heart failure: Secondary | ICD-10-CM | POA: Diagnosis not present

## 2022-12-01 DIAGNOSIS — I5032 Chronic diastolic (congestive) heart failure: Secondary | ICD-10-CM | POA: Diagnosis not present

## 2022-12-01 DIAGNOSIS — M47812 Spondylosis without myelopathy or radiculopathy, cervical region: Secondary | ICD-10-CM | POA: Diagnosis not present

## 2022-12-01 DIAGNOSIS — G8929 Other chronic pain: Secondary | ICD-10-CM | POA: Diagnosis not present

## 2022-12-01 DIAGNOSIS — L89154 Pressure ulcer of sacral region, stage 4: Secondary | ICD-10-CM | POA: Diagnosis not present

## 2022-12-01 DIAGNOSIS — L89213 Pressure ulcer of right hip, stage 3: Secondary | ICD-10-CM | POA: Diagnosis not present

## 2022-12-01 DIAGNOSIS — E1151 Type 2 diabetes mellitus with diabetic peripheral angiopathy without gangrene: Secondary | ICD-10-CM | POA: Diagnosis not present

## 2022-12-01 DIAGNOSIS — I700 Atherosclerosis of aorta: Secondary | ICD-10-CM | POA: Diagnosis not present

## 2022-12-01 DIAGNOSIS — I82422 Acute embolism and thrombosis of left iliac vein: Secondary | ICD-10-CM | POA: Diagnosis not present

## 2022-12-01 DIAGNOSIS — M1712 Unilateral primary osteoarthritis, left knee: Secondary | ICD-10-CM | POA: Diagnosis not present

## 2022-12-01 DIAGNOSIS — I70201 Unspecified atherosclerosis of native arteries of extremities, right leg: Secondary | ICD-10-CM | POA: Diagnosis not present

## 2022-12-01 DIAGNOSIS — I82412 Acute embolism and thrombosis of left femoral vein: Secondary | ICD-10-CM | POA: Diagnosis not present

## 2022-12-01 DIAGNOSIS — E039 Hypothyroidism, unspecified: Secondary | ICD-10-CM | POA: Diagnosis not present

## 2022-12-01 DIAGNOSIS — M5410 Radiculopathy, site unspecified: Secondary | ICD-10-CM | POA: Diagnosis not present

## 2022-12-01 DIAGNOSIS — N1330 Unspecified hydronephrosis: Secondary | ICD-10-CM | POA: Diagnosis not present

## 2022-12-03 DIAGNOSIS — G9341 Metabolic encephalopathy: Secondary | ICD-10-CM | POA: Diagnosis not present

## 2022-12-03 DIAGNOSIS — L89213 Pressure ulcer of right hip, stage 3: Secondary | ICD-10-CM | POA: Diagnosis not present

## 2022-12-03 DIAGNOSIS — I82422 Acute embolism and thrombosis of left iliac vein: Secondary | ICD-10-CM | POA: Diagnosis not present

## 2022-12-03 DIAGNOSIS — I82412 Acute embolism and thrombosis of left femoral vein: Secondary | ICD-10-CM | POA: Diagnosis not present

## 2022-12-03 DIAGNOSIS — L89892 Pressure ulcer of other site, stage 2: Secondary | ICD-10-CM | POA: Diagnosis not present

## 2022-12-03 DIAGNOSIS — L89154 Pressure ulcer of sacral region, stage 4: Secondary | ICD-10-CM | POA: Diagnosis not present

## 2022-12-03 DIAGNOSIS — L89613 Pressure ulcer of right heel, stage 3: Secondary | ICD-10-CM | POA: Diagnosis not present

## 2022-12-04 ENCOUNTER — Encounter (INDEPENDENT_AMBULATORY_CARE_PROVIDER_SITE_OTHER): Payer: PPO

## 2022-12-04 ENCOUNTER — Ambulatory Visit (INDEPENDENT_AMBULATORY_CARE_PROVIDER_SITE_OTHER): Payer: PPO | Admitting: Nurse Practitioner

## 2022-12-05 ENCOUNTER — Encounter (HOSPITAL_BASED_OUTPATIENT_CLINIC_OR_DEPARTMENT_OTHER): Payer: PPO | Admitting: Internal Medicine

## 2022-12-05 DIAGNOSIS — I87312 Chronic venous hypertension (idiopathic) with ulcer of left lower extremity: Secondary | ICD-10-CM | POA: Diagnosis not present

## 2022-12-05 DIAGNOSIS — L89210 Pressure ulcer of right hip, unstageable: Secondary | ICD-10-CM

## 2022-12-05 DIAGNOSIS — L89620 Pressure ulcer of left heel, unstageable: Secondary | ICD-10-CM

## 2022-12-06 NOTE — Progress Notes (Signed)
[name], [name] ([id number]) [id number] Page 1 of 13 Visit Report for 12/05/2022 Chief Complaint Document Details Patient Name: Date of Service: [name], [name] 12/05/2022 2:45 PM Medical Record Number: [id number] Patient Account Number: [id number] Date of Birth/Sex: Treating [name]: January 29, 1941 (82 y.o. [name] Primary Care Provider: Tracie [name] Other Clinician: Massie [name] Referring Provider: Treating Provider/Extender: Arn Medal Weeks in Treatment: 10 Information Obtained from: Patient Chief Complaint 09/26/2022; left lower extremity wound, right heel wound and left buttocks wound Electronic Signature(s) Signed: 12/05/2022 4:31:24 PM By: [name] Shan DO Entered By: [name] on 12/05/2022 16:01:55 -------------------------------------------------------------------------------- Debridement Details Patient Name: Date of Service: [facility], [name] 12/05/2022 2:45 PM Medical Record Number: [id number] Patient Account Number: [id number] Date of Birth/Sex: Treating [name]: [birth year] (82 y.o. [name] Primary Care Provider: Tracie [name] Other Clinician: Massie [name] Referring Provider: Treating Provider/Extender: Arn Medal Weeks in Treatment: 10 Debridement Performed for Assessment: Wound #13 Left Calcaneus Performed By: Physician [name] Shan, MD Debridement Type: Debridement Level of Consciousness (Pre-procedure): Awake and Alert Pre-procedure Verification/Time Out Yes - 15:50 Taken: Start Time: 15:50 T Area Debrided (L x W): otal 1 (cm) x 1 (cm) = 1 (cm) Tissue and other material debrided: Viable, Non-Viable, Eschar Level: Non-Viable Tissue Debridement Description: Selective/Open Wound Instrument: Curette Bleeding: Minimum Hemostasis Achieved: Pressure Response to Treatment: Procedure was tolerated well Level of Consciousness (Post- Awake and  Alert procedure): Post Debridement Measurements of Total Wound [name], [name] ([id number]) [id number] Page 2 of 13 Length: (cm) 2 Stage: Category/Stage II Width: (cm) 2.2 Depth: (cm) 0.2 Volume: (cm) 0.691 Character of Wound/Ulcer Post Debridement: Stable Post Procedure Diagnosis Same as Pre-procedure Electronic Signature(s) Signed: 12/05/2022 4:31:24 PM By: [name] Shan DO Signed: 12/05/2022 5:30:54 PM By: [name] Signed: 12/05/2022 5:47:34 PM By: [name], [name], BSN Entered By: [name] on 12/05/2022 15:58:18 -------------------------------------------------------------------------------- Debridement Details Patient Name: Date of Service: [facility], [name] 12/05/2022 2:45 PM Medical Record Number: [id number] Patient Account Number: [id number] Date of Birth/Sex: Treating [name]: [birth year] (82 y.o. [name] Primary Care Provider: Tracie [name] Other Clinician: Massie [name] Referring Provider: Treating Provider/Extender: Arn Medal Weeks in Treatment: 10 Debridement Performed for Assessment: Wound #11 Right Ischial Tuberosity Performed By: Physician [name] Shan, MD Debridement Type: Debridement Level of Consciousness (Pre-procedure): Awake and Alert Pre-procedure Verification/Time Out Yes - 15:56 Taken: Start Time: 15:56 T Area Debrided (L x W): otal 1.4 (cm) x 2.5 (cm) = 3.5 (cm) Tissue and other material debrided: Viable, Non-Viable, Slough, Subcutaneous, Slough Level: Skin/Subcutaneous Tissue Debridement Description: Excisional Instrument: Forceps, Scissors Bleeding: Minimum Hemostasis Achieved: Pressure Response to Treatment: Procedure was tolerated well Level of Consciousness (Post- Awake and Alert procedure): Post Debridement Measurements of Total Wound Length: (cm) 1.4 Stage: Unstageable/Unclassified Width: (cm) 2.5 Depth: (cm) 1.2 Volume: (cm)  3.299 Character of Wound/Ulcer Post Debridement: Stable Post Procedure Diagnosis Same as Pre-procedure Electronic Signature(s) Signed: 12/05/2022 4:31:24 PM By: [name] Shan DO Signed: 12/05/2022 5:30:54 PM By: [name] Signed: 12/05/2022 5:47:34 PM By: [name], [name], BSN Entered By: [name] on 12/05/2022 15:59:21 [name], [name] ([id number]) [id number] Page 3 of 13 -------------------------------------------------------------------------------- HPI Details Patient Name: Date of Service: [facility], [name] 12/05/2022 2:45 PM Medical Record Number: [id number] Patient Account Number: [id number] Date of Birth/Sex: Treating [name]: 1941-06-06 (81 y.o. [name] Primary Care Provider: Tracie [name] Other Clinician: Massie [name] Referring Provider: Treating Provider/Extender: [name],  [name] Weeks in Treatment: 10 History of Present Illness HPI Description: 82 year old patient who comes with a referral for bilateral lower extremity edema and a lower extremity ulceration and has been sent by her PCP Dr. Placido [name]. I understand the patient was recently put on amoxicillin and doxycycline but could not tolerate the amoxicillin. doxycycline course was completed. a BNP and EKG was supposed to be normal and the patient did not have any dyspnea. the patient has been on a diuretic. The patient was also prescribed a pair of elastic compression stockings of the 20-30 mmHg pressure variety. x-ray of the right ankle was done on 09/20/2015 and showed posttraumatic and postsurgical changes of the right ankle with secondary degenerative changes of the tibiotalar joint and to a lesser degree the subtalar joint. No definite acute bony abnormalities are noted. Past medical history significant for diabetes mellitus, hypertension, hyperlipidemia, right breast cancer treated with a mastectomy in 2014. She has  never smoked. 10/24/2015 -- she had delayed her vascular test because of her husband surgery but she is now ready to get him taken care of. He is also unable to use compression stockings and hence we will need to order her Juzo wraps. 10/31/2015-- was seen by [name] on 10/28/2015. She had a left lower extremity arterial duplex done at his office a couple of years ago and that was essentially normal. Today they performed a venous duplex which revealed no evidence of deep vein thrombosis, superficial thrombophlebitis, no venous reflex was seen on the right and a minimal amount of reflux was seen on the left great saphenous vein but no significant reflux was seen. Impression was that there was a component of lymphedema present from a previous surgery and he would recommend compression stockings and leg elevation. Readmission: 07-24-2022 upon evaluation today patient presents for initial inspection here in our clinic concerning issues that she has been having with her legs this is actually been going on for several years according to what her family member with her today tells me as well as what the patient reiterates as well. She is currently most recently been seeing Dr. Vickki Muff and subsequently he had her in [place] boots. However 2 weeks ago he referred her to [place] and then subsequently took her out of the Unna boot wraps at that point. At this time the left leg looks to be worse in the right leg currently. She is on Lasix and lisinopril with hydrochlorothiazide she has high blood pressure she also has issues currently with lower extremity swelling and edema which has been an ongoing issue for her as well. Patient does have a history of chronic venous hypertension, lymphedema, diabetes mellitus type 2, hypertension, peripheral vascular disease, and neuropathy. Currently she is on Lasix as well as lisinopril with hydrochlorothiazide. 08-02-2022 upon evaluation today patient presents for reevaluation the  good news is she is actually doing somewhat better in regard to the wound and the overall appearance and sinuses. The unfortunate thing is her infection really is not significantly improved we did have to switch out her antibiotic once we got that final result back and I switched her to Levaquin and away from the doxycycline. Unfortunately the doxycycline had been doing poorly for her. In fact she had had diarrhea from the time she started taking it on Friday and she is still having it when she shows up today for evaluation. Again I was not aware of this and obviously she does appear to be somewhat dehydrated as well based  on what I see. My concern which I discussed with the patient today is the possibility of a C. difficile infection. With that being said fact this started immediately upon taking the doxycycline makes me think that it was just the medicine and is not completely out of her system yet despite having taken the last dose Tuesday morning. Nonetheless with what we are seeing currently I want to be sure that reason I Minna contact her primary care provider and see if a would be willing to see her and test for C. difficile infection. 08-07-2022 upon evaluation today patient appears to be doing well currently in regard to her wounds which are actually measuring much better this is great news. Fortunately I do not see any signs of active infection locally or systemically at this time which is great as well. The good news is she was tested for a C. difficile infection and it was negative. I am very pleased and thankful for primary care provider for doing that so this means that she was just having a severe reaction to the doxycycline we have added that to her allergy list at this point. 08-14-2022 upon evaluation today patient appears to be doing well currently in regard to her dehydration she is actually significantly improved compared to last time I saw her last week. With that being said I do  not see any evidence of active infection systemically at this time which is great news. However locally she still does appear to have cellulitis in left lower extremity. I discussed with her today that I do believe she would benefit from going ahead and starting the [place]. Again we will concerned about the C. difficile infection of the diarrhea but that this turned out to be just an issue with a reaction to the doxycycline. My hope is that the Levaquin will not cause her any complication and will be able to treat the infection. I did review her arterial study as well which was dated on 07-31-2022. It showed that she had a ABI on the right of 1.30 and on the left of 1.27 with a TBI on the right of 1.12 and the left 1.21 this is a normal arterial study. Again on the patient's wound culture she actually did show evidence of Proteus, Morganella, and MRSA. Levaquin is a good option here across the board. 09/26/2022 Ms. [name] Grace is a 82 year old female with a past medical history of type 2 diabetes currently controlled on oral agents, venous insufficiency/lymphedema, right breast cancer and chronic diastolic heart failure that presents to the clinic for a 1 month history of nonhealing wounds to the left lower extremity, right heel and left buttocks. She states that the buttocks wound and the right heel wound developed while she was in the hospital. She was admitted on 08/22/2022 for severe sepsis secondary to acute sigmoid and distal colonic diverticulitis. At that time it was noted she had a stage I decubitus ulcer to her bilateral buttocks. A wound to the heel was not mentioned. She currently denies systemic signs of infection. She came into clinic in a blue gown with no undergarments. She has not been dressing the wounds. She states she just recently obtained home health and they are coming out for the first time this week. She is seen in our clinic often for lower extremity wounds secondary to  venous insufficiency. 11/22; patient presents for follow-up. Son is present during the encounter. Per son it sounds like they are not doing any dressing changes.  She states she has home health but they have not come out. She is going to let us know which home health agency she has been approved for so we can send orders. Currently ANDREIA, GANDOLFI ([id number]) [id number] Page 4 of 13 she denies signs of infection. 12/13; patient presents for follow-up. Patient has home health and they are coming out once a week. It appears that there has not been any dressing changes except for with home health. Patient has a newly discovered wound to the left foot. There is exposed bone. There is slight erythema and increased warmth to the surrounding tissue. Patient is completely unaware of this wound. 12/20; patient presents for follow-up. She has been taking her oral antibiotics. She now has an eschar and wound to the right hip. She has been using Dakin's wet-to-dry dressings to the left foot wound and buttocks wound. She has been using Medihoney and silver alginate to the right heel wound. She currently denies systemic signs of infection. She is mainly bedbound or in a wheelchair. 1/10; patient presents for follow-up. Patient had her left second toenail removed by Dr. Vickki Muff, podiatry on 12/21. Unfortunately she did not feel well and she was admitted to the hospital for sepsis. Her right heel wound was thought to be infected and this was debrided in the OR by Dr. Vickki Muff. Culture and bone biopsy had no growth. She has been using Medihoney to all the wound beds except for the lefty buttocks wound for which she uses Dakin's wet-to-dry dressings. She has developed a pressure injury to the left heel now. This is despite having Prevalon boots. Although she is not wearing them today. She has completed 4 weeks of Augmentin and also a week of doxycycline for osteomyelitis of the left foot.  Her left foot wound no longer probes to bone. Currently she denies signs of infection. 1/24; patient presents for follow-up. She has been using Medihoney and Hydrofera Blue to the wound beds except for this buttocks wound she has been using Dakin's wet-to-dry dressings. She has developed 2 new wounds 1 to the left heel and the other to the right medial ankle. She claims she is wearing her Prevalon boots all the time although she does not have them on today. She currently denies signs of infection. Electronic Signature(s) Signed: 12/05/2022 4:31:24 PM By: [name] Shan DO Entered By: [name] on 12/05/2022 16:03:03 -------------------------------------------------------------------------------- Physical Exam Details Patient Name: Date of Service: [facility], [name] 12/05/2022 2:45 PM Medical Record Number: [id number] Patient Account Number: [id number] Date of Birth/Sex: Treating [name]: Sep 12, 1941 (82 y.o. [name] Primary Care Provider: Tracie [name] Other Clinician: Massie [name] Referring Provider: Treating Provider/Extender: Arn Medal Weeks in Treatment: 10 Constitutional . Cardiovascular . Psychiatric . Notes T the right heel there is an open wound with nonviable tissue and mostly granulation tissue. T the medial malleolus there is an open wound with granulation o o tissue and tightly adhered nonviable tissue. T the left buttocks there is a large open wound with granulation tissue. T the left foot first metatarsal there is an o o open wound with granulation tissue. T the left heel there is an open wound with eschar throughout. T the right hip there is a wound with non viable throughout, o o Now with pockets of increased depth. No signs of overt acute infection. Electronic Signature(s) Signed: 12/05/2022 4:31:24 PM By: [name] Shan DO Entered By: [name] on 12/05/2022 16:04:29 Kamp, [name] ([id number])  [id number] Page 5  of 13 -------------------------------------------------------------------------------- Physician Orders Details Patient Name: Date of Service: [facility], [name] 12/05/2022 2:45 PM Medical Record Number: [id number] Patient Account Number: [id number] Date of Birth/Sex: Treating [name]: 1941/08/26 (82 y.o. [name], [name] Primary Care Provider: Tracie [name] Other Clinician: Massie [name] Referring Provider: Treating Provider/Extender: Arn Medal Weeks in Treatment: 10 Verbal / Phone Orders: No Diagnosis Coding ICD-10 Coding Code Description [phone] Non-pressure chronic ulcer of other part of left lower leg with fat layer exposed I87.312 Chronic venous hypertension (idiopathic) with ulcer of left lower extremity I89.0 Lymphedema, not elsewhere classified E11.622 Type 2 diabetes mellitus with other skin ulcer D98.33 Chronic diastolic (congestive) heart failure L89.320 Pressure ulcer of left buttock, unstageable L89.610 Pressure ulcer of right heel, unstageable S91.302A Unspecified open wound, left foot, initial encounter M86.172 Other acute osteomyelitis, left ankle and foot L89.210 Pressure ulcer of right hip, unstageable L89.620 Pressure ulcer of left heel, unstageable L89.513 Pressure ulcer of right ankle, stage 3 Follow-up Appointments Return Appointment in 1 week. [place] for wound care. May utilize formulary equivalent dressing for wound treatment orders unless otherwise specified. Home Health Nurse may visit PRN to address patients wound care needs. Alvis Lemmings [phone] Scheduled days for dressing changes to be completed; exception, patient has scheduled wound care visit that day. - Home health to change once weekly and son will change dressing once weekly, patient to be seen on Wednesday's in office Bathing/ Shower/ Hygiene Wash wounds with antibacterial soap and water. No tub  bath. Anesthetic (Use 'Patient Medications' Section for Anesthetic Order Entry) Lidocaine applied to wound bed Off-Loading Other: - wear prevalon heel boots at all times Wound Treatment Wound #10 - Metatarsal head first Wound Laterality: Left Cleanser: Dakin 16 (oz) 0.25 (Generic) 3 x Per Week/30 Days Discharge Instructions: Use as directed. Cleanser: Soap and Water 3 x Per Week/30 Days Discharge Instructions: Gently cleanse wound with antibacterial soap, rinse and pat dry prior to dressing wounds Prim Dressing: Honey: Activon Honey Gel, 25 (g) Tube ary 3 x Per Week/30 Days Prim Dressing: Hydrofera Blue Ready Transfer Foam, 2.5x2.5 (in/in) (Dispense As Written) 3 x Per Week/30 Days ary Discharge Instructions: Apply Hydrofera Blue Ready to wound bed as directed Secondary Dressing: ABD Pad 5x9 (in/in) 3 x Per Week/30 Days Discharge Instructions: Cover with ABD pad Duggar, Aastha L ([id number]) [id number] Page 6 of 13 Secured With: Medipore T - 52M Medipore H Soft Cloth Surgical T ape ape, 2x2 (in/yd) 3 x Per Week/30 Days Wound #11 - Ischial Tuberosity Wound Laterality: Right Cleanser: Dakin 16 (oz) 0.25 (Generic) 1 x Per Day/30 Days Discharge Instructions: Use as directed. Cleanser: Soap and Water 1 x Per Day/30 Days Discharge Instructions: Gently cleanse wound with antibacterial soap, rinse and pat dry prior to dressing wounds Prim Dressing: Gauze 1 x Per Day/30 Days ary Discharge Instructions: dakins wet to dry Secondary Dressing: (BORDER) Zetuvit Plus SILICONE BORDER Dressing 5x5 (in/in) (Generic) 1 x Per Day/30 Days Discharge Instructions: Please do not put silicone bordered dressings under wraps. Use non-bordered dressing only. Secured With: Medipore T - 52M Medipore H Soft Cloth Surgical T ape ape, 2x2 (in/yd) (Dispense As Written) 1 x Per Day/30 Days Wound #12 - Ankle Wound Laterality: Right, Medial Cleanser: Dakin 16 (oz) 0.25 (Generic) 1 x Per  Day/30 Days Discharge Instructions: Use as directed. Cleanser: Soap and Water 1 x Per Day/30 Days Discharge Instructions: Gently cleanse wound with antibacterial soap, rinse and pat dry prior to dressing wounds Prim  Dressing: Honey: Activon Honey Gel, 25 (g) Tube ary 1 x Per Day/30 Days Secondary Dressing: Hydrofera Blue Ready Transfer Foam, 2.5x2.5 (in/in) 1 x Per Day/30 Days Discharge Instructions: Apply to wound bed over non-stick dressing. Secondary Dressing: (BORDER) Zetuvit Plus SILICONE BORDER Dressing 5x5 (in/in) (Generic) 1 x Per Day/30 Days Discharge Instructions: Please do not put silicone bordered dressings under wraps. Use non-bordered dressing only. Secured With: Medipore T - 66M Medipore H Soft Cloth Surgical T ape ape, 2x2 (in/yd) (Dispense As Written) 1 x Per Day/30 Days Wound #13 - Calcaneus Wound Laterality: Left Cleanser: Dakin 16 (oz) 0.25 (Generic) 1 x Per Day/30 Days Discharge Instructions: Use as directed. Cleanser: Soap and Water 1 x Per Day/30 Days Discharge Instructions: Gently cleanse wound with antibacterial soap, rinse and pat dry prior to dressing wounds Prim Dressing: Honey: Activon Honey Gel, 25 (g) Tube ary 1 x Per Day/30 Days Secondary Dressing: Hydrofera Blue Ready Transfer Foam, 2.5x2.5 (in/in) 1 x Per Day/30 Days Discharge Instructions: Apply to wound bed over non-stick dressing. Secondary Dressing: (BORDER) Zetuvit Plus SILICONE BORDER Dressing 5x5 (in/in) (Generic) 1 x Per Day/30 Days Discharge Instructions: Please do not put silicone bordered dressings under wraps. Use non-bordered dressing only. Secured With: Medipore T - 66M Medipore H Soft Cloth Surgical T ape ape, 2x2 (in/yd) (Dispense As Written) 1 x Per Day/30 Days Wound #6 - Calcaneus Wound Laterality: Right Cleanser: Byram Ancillary Kit - 15 Day Supply (Generic) 3 x Per Week/30 Days Discharge Instructions: Use supplies as instructed; Kit contains: (15) Saline Bullets; (15) 3x3 Gauze; 15 pr  Gloves Cleanser: Dakin 16 (oz) 0.25 (Dispense As Written) 3 x Per Week/30 Days Discharge Instructions: Use as directed. Cleanser: Soap and Water 3 x Per Week/30 Days Discharge Instructions: Gently cleanse wound with antibacterial soap, rinse and pat dry prior to dressing wounds Topical: Activon Honey Gel, 25 (g) Tube (Dispense As Written) 3 x Per Week/30 Days Prim Dressing: Hydrofera Blue Ready Transfer Foam, 2.5x2.5 (in/in) 3 x Per Week/30 Days ary Discharge Instructions: Apply Hydrofera Blue Ready to wound bed as directed Secondary Dressing: ABD Pad 5x9 (in/in) 3 x Per Week/30 Days Discharge Instructions: Cover with ABD pad Secondary Dressing: Kerlix 4.5 x 4.1 (in/yd) 3 x Per Week/30 Days Discharge Instructions: Apply Kerlix 4.5 x 4.1 (in/yd) as instructed Secured With: Medipore T - 66M Medipore H Soft Cloth Surgical T ape ape, 2x2 (in/yd) 3 x Per Week/30 Days Wound #8 - Gluteus Wound Laterality: Left, Distal [name], [name] ([id number]) [id number] Page 7 of 13 Cleanser: Dakin 16 (oz) 0.25 (Generic) 1 x Per Day/30 Days Discharge Instructions: Use as directed. Cleanser: Soap and Water 1 x Per Day/30 Days Discharge Instructions: Gently cleanse wound with antibacterial soap, rinse and pat dry prior to dressing wounds Prim Dressing: Gauze 1 x Per Day/30 Days ary Discharge Instructions: moistened with Dakins Solution for wet to dry dressing Secondary Dressing: ABD Pad 5x9 (in/in) 1 x Per Day/30 Days Discharge Instructions: Cover with ABD pad Secured With: Medipore T - 66M Medipore H Soft Cloth Surgical T ape ape, 2x2 (in/yd) 1 x Per Day/30 Days Electronic Signature(s) Signed: 12/05/2022 4:31:24 PM By: [name] Shan DO Signed: 12/05/2022 5:30:54 PM By: [name] Entered By: [name] on 12/05/2022 16:22:00 -------------------------------------------------------------------------------- Problem List Details Patient Name: Date of Service: [facility], [name] 12/05/2022 2:45 PM Medical Record Number: [id number] Patient Account Number: [id number] Date of Birth/Sex: Treating [name]: July 01, 1941 (82 y.o. [name] Primary Care Provider: Tracie [name] Other Clinician:  Massie [name] Referring Provider: Treating Provider/Extender: Arn Medal Weeks in Treatment: 10 Active Problems ICD-10 Encounter Code Description Active Date MDM Diagnosis [phone] Non-pressure chronic ulcer of other part of left lower leg with fat layer 09/26/2022 No Yes exposed I87.312 Chronic venous hypertension (idiopathic) with ulcer of left lower extremity 09/26/2022 No Yes I89.0 Lymphedema, not elsewhere classified 09/26/2022 No Yes E11.622 Type 2 diabetes mellitus with other skin ulcer 09/26/2022 No Yes V76.16 Chronic diastolic (congestive) heart failure 09/26/2022 No Yes L89.320 Pressure ulcer of left buttock, unstageable 09/26/2022 No Yes L89.610 Pressure ulcer of right heel, unstageable 09/26/2022 No Yes [name], [name] ([id number]) [id number] Page 8 of 13 [phone] Unspecified open wound, left foot, initial encounter 10/24/2022 No Yes M86.172 Other acute osteomyelitis, left ankle and foot 10/24/2022 No Yes L89.210 Pressure ulcer of right hip, unstageable 11/21/2022 No Yes L89.513 Pressure ulcer of right ankle, stage 3 12/05/2022 No Yes L89.620 Pressure ulcer of left heel, unstageable 12/05/2022 No Yes Inactive Problems Resolved Problems Electronic Signature(s) Signed: 12/05/2022 4:31:24 PM By: [name] Shan DO Entered By: [name] on 12/05/2022 16:01:51 -------------------------------------------------------------------------------- Progress Note Details Patient Name: Date of Service: [facility], [name] 12/05/2022 2:45 PM Medical Record Number: [id number] Patient Account Number: [id number] Date of Birth/Sex: Treating [name]: 1940/12/13 (82 y.o. [name] Primary Care Provider:  Tracie [name] Other Clinician: Massie [name] Referring Provider: Treating Provider/Extender: Arn Medal Weeks in Treatment: 10 Subjective Chief Complaint Information obtained from Patient 09/26/2022; left lower extremity wound, right heel wound and left buttocks wound History of Present Illness (HPI) 82 year old patient who comes with a referral for bilateral lower extremity edema and a lower extremity ulceration and has been sent by her PCP Dr. Placido [name]. I understand the patient was recently put on amoxicillin and doxycycline but could not tolerate the amoxicillin. doxycycline course was completed. a BNP and EKG was supposed to be normal and the patient did not have any dyspnea. the patient has been on a diuretic. The patient was also prescribed a pair of elastic compression stockings of the 20-30 mmHg pressure variety. x-ray of the right ankle was done on 09/20/2015 and showed posttraumatic and postsurgical changes of the right ankle with secondary degenerative changes of the tibiotalar joint and to a lesser degree the subtalar joint. No definite acute bony abnormalities are noted. Past medical history significant for diabetes mellitus, hypertension, hyperlipidemia, right breast cancer treated with a mastectomy in 2014. She has never smoked. 10/24/2015 -- she had delayed her vascular test because of her husband surgery but she is now ready to get him taken care of. He is also unable to use compression stockings and hence we will need to order her Juzo wraps. 10/31/2015-- was seen by [name] on 10/28/2015. She had a left lower extremity arterial duplex done at his office a couple of years ago and that was essentially normal. Today they performed a venous duplex which revealed no evidence of deep vein thrombosis, superficial thrombophlebitis, no venous reflex was seen on the right and a minimal amount of reflux was seen on the left great saphenous vein but no  significant reflux was seen. Impression was that there was a component of lymphedema present from a previous surgery and he would recommend compression stockings and leg elevation. [name], [name] ([id number]) [id number] Page 9 of 13 Readmission: 07-24-2022 upon evaluation today patient presents for initial inspection here in our clinic concerning issues that she has been having with her legs this is actually been  going on for several years according to what her family member with her today tells me as well as what the patient reiterates as well. She is currently most recently been seeing Dr. Vickki Muff and subsequently he had her in [place] boots. However 2 weeks ago he referred her to [place] and then subsequently took her out of the [place] boot wraps at that point. At this time the left leg looks to be worse in the right leg currently. She is on Lasix and lisinopril with hydrochlorothiazide she has high blood pressure she also has issues currently with lower extremity swelling and edema which has been an ongoing issue for her as well. Patient does have a history of chronic venous hypertension, lymphedema, diabetes mellitus type 2, hypertension, peripheral vascular disease, and neuropathy. Currently she is on Lasix as well as lisinopril with hydrochlorothiazide. 08-02-2022 upon evaluation today patient presents for reevaluation the good news is she is actually doing somewhat better in regard to the wound and the overall appearance and sinuses. The unfortunate thing is her infection really is not significantly improved we did have to switch out her antibiotic once we got that final result back and I switched her to Levaquin and away from the doxycycline. Unfortunately the doxycycline had been doing poorly for her. In fact she had had diarrhea from the time she started taking it on Friday and she is still having it when she shows up today for evaluation. Again I was not aware of  this and obviously she does appear to be somewhat dehydrated as well based on what I see. My concern which I discussed with the patient today is the possibility of a C. difficile infection. With that being said fact this started immediately upon taking the doxycycline makes me think that it was just the medicine and is not completely out of her system yet despite having taken the last dose Tuesday morning. Nonetheless with what we are seeing currently I want to be sure that reason I Minna contact her primary care provider and see if a would be willing to see her and test for C. difficile infection. 08-07-2022 upon evaluation today patient appears to be doing well currently in regard to her wounds which are actually measuring much better this is great news. Fortunately I do not see any signs of active infection locally or systemically at this time which is great as well. The good news is she was tested for a C. difficile infection and it was negative. I am very pleased and thankful for primary care provider for doing that so this means that she was just having a severe reaction to the doxycycline we have added that to her allergy list at this point. 08-14-2022 upon evaluation today patient appears to be doing well currently in regard to her dehydration she is actually significantly improved compared to last time I saw her last week. With that being said I do not see any evidence of active infection systemically at this time which is great news. However locally she still does appear to have cellulitis in left lower extremity. I discussed with her today that I do believe she would benefit from going ahead and starting the [place]. Again we will concerned about the C. difficile infection of the diarrhea but that this turned out to be just an issue with a reaction to the doxycycline. My hope is that the Levaquin will not cause her any complication and will be able to treat the infection. I did review her  arterial study as well which was dated on 07-31-2022. It showed that she had a ABI on the right of 1.30 and on the left of 1.27 with a TBI on the right of 1.12 and the left 1.21 this is a normal arterial study. Again on the patient's wound culture she actually did show evidence of Proteus, Morganella, and MRSA. Levaquin is a good option here across the board. 09/26/2022 [name] is a 82 year old female with a past medical history of type 2 diabetes currently controlled on oral agents, venous insufficiency/lymphedema, right breast cancer and chronic diastolic heart failure that presents to the clinic for a 1 month history of nonhealing wounds to the left lower extremity, right heel and left buttocks. She states that the buttocks wound and the right heel wound developed while she was in the hospital. She was admitted on 08/22/2022 for severe sepsis secondary to acute sigmoid and distal colonic diverticulitis. At that time it was noted she had a stage I decubitus ulcer to her bilateral buttocks. A wound to the heel was not mentioned. She currently denies systemic signs of infection. She came into clinic in a blue gown with no undergarments. She has not been dressing the wounds. She states she just recently obtained home health and they are coming out for the first time this week. She is seen in our clinic often for lower extremity wounds secondary to venous insufficiency. 11/22; patient presents for follow-up. Son is present during the encounter. Per son it sounds like they are not doing any dressing changes. She states she has home health but they have not come out. She is going to let us know which home health agency she has been approved for so we can send orders. Currently she denies signs of infection. 12/13; patient presents for follow-up. Patient has home health and they are coming out once a week. It appears that there has not been any dressing changes except for with home health.  Patient has a newly discovered wound to the left foot. There is exposed bone. There is slight erythema and increased warmth to the surrounding tissue. Patient is completely unaware of this wound. 12/20; patient presents for follow-up. She has been taking her oral antibiotics. She now has an eschar and wound to the right hip. She has been using Dakin's wet-to-dry dressings to the left foot wound and buttocks wound. She has been using Medihoney and silver alginate to the right heel wound. She currently denies systemic signs of infection. She is mainly bedbound or in a wheelchair. 1/10; patient presents for follow-up. Patient had her left second toenail removed by Dr. Vickki Muff, podiatry on 12/21. Unfortunately she did not feel well and she was admitted to the hospital for sepsis. Her right heel wound was thought to be infected and this was debrided in the OR by Dr. Vickki Muff. Culture and bone biopsy had no growth. She has been using Medihoney to all the wound beds except for the lefty buttocks wound for which she uses Dakin's wet-to-dry dressings. She has developed a pressure injury to the left heel now. This is despite having Prevalon boots. Although she is not wearing them today. She has completed 4 weeks of Augmentin and also a week of doxycycline for osteomyelitis of the left foot. Her left foot wound no longer probes to bone. Currently she denies signs of infection. 1/24; patient presents for follow-up. She has been using Medihoney and Hydrofera Blue to the wound beds except for this buttocks wound she has  been using Dakin's wet-to-dry dressings. She has developed 2 new wounds 1 to the left heel and the other to the right medial ankle. She claims she is wearing her Prevalon boots all the time although she does not have them on today. She currently denies signs of infection. Objective Constitutional Vitals Time Taken: 3:07 PM, Weight: 116 lbs, Temperature: 98.4 F, Pulse: 103 bpm, Respiratory Rate: 16  breaths/min, Blood Pressure: 123/79 mmHg. General Notes: T the right heel there is an open wound with nonviable tissue and mostly granulation tissue. T the medial malleolus there is an open wound with o o granulation tissue and tightly adhered nonviable tissue. T the left buttocks there is a large open wound with granulation tissue. T the left foot first metatarsal o o there is an open wound with granulation tissue. T the left heel there is an open wound with eschar throughout. T the right hip there is a wound with non viable o o throughout, Now with pockets of increased depth. No signs of overt acute infection. Integumentary (Hair, Skin) Wound #10 status is Open. Original cause of wound was Pressure Injury. The date acquired was: 10/23/2022. The wound has been in treatment 6 weeks. The wound is located on the Left Metatarsal head first. The wound measures 1.2cm length x 1.4cm width x 0.1cm depth; 1.319cm^2 area and 0.132cm^3 volume. There is bone and Fat Layer (Subcutaneous Tissue) exposed. There is a large amount of serosanguineous drainage noted. The wound margin is flat and intact. [name], [name] ([id number]) [id number] Page 10 of 13 There is no granulation within the wound bed. There is a medium (34-66%) amount of necrotic tissue within the wound bed including Necrosis of Bone. Wound #11 status is Open. Original cause of wound was Pressure Injury. The date acquired was: 10/24/2022. The wound has been in treatment 5 weeks. The wound is located on the Right Ischial Tuberosity. The wound measures 1.4cm length x 2.5cm width x 1.2cm depth; 2.749cm^2 area and 3.299cm^3 volume. There is Fat Layer (Subcutaneous Tissue) exposed. There is undermining starting at 12:00 and ending at 6:00 with a maximum distance of 1.7cm. There is a none present amount of drainage noted. The wound margin is flat and intact. There is a medium (34-66%) amount of necrotic tissue within the  wound bed including Eschar. Wound #12 status is Open. Original cause of wound was Pressure Injury. The date acquired was: 12/05/2022. The wound is located on the Right,Medial Ankle. The wound measures 2cm length x 2.5cm width x 0.1cm depth; 3.927cm^2 area and 0.393cm^3 volume. There is Fat Layer (Subcutaneous Tissue) exposed. There is a medium amount of serosanguineous drainage noted. The wound margin is flat and intact. Wound #13 status is Open. Original cause of wound was Pressure Injury. The date acquired was: 11/28/2022. The wound is located on the Left Calcaneus. The wound measures 2cm length x 2.2cm width x 0.1cm depth; 3.456cm^2 area and 0.346cm^3 volume. There is Fat Layer (Subcutaneous Tissue) exposed. There is a medium amount of serosanguineous drainage noted. The wound margin is flat and intact. There is no granulation within the wound bed. There is a large (67- 100%) amount of necrotic tissue within the wound bed including Eschar. Wound #6 status is Open. Original cause of wound was Pressure Injury. The date acquired was: 07/23/2022. The wound has been in treatment 10 weeks. The wound is located on the Right Calcaneus. The wound measures 3cm length x 2.3cm width x 0.1cm depth; 5.419cm^2 area and 0.542cm^3 volume. There is Fat  Layer (Subcutaneous Tissue) exposed. There is a medium amount of serosanguineous drainage noted. Foul odor after cleansing was noted. There is no granulation within the wound bed. There is a large (67-100%) amount of necrotic tissue within the wound bed including Eschar. Wound #8 status is Open. Original cause of wound was Pressure Injury. The date acquired was: 07/23/2022. The wound has been in treatment 10 weeks. The wound is located on the Left,Distal Gluteus. The wound measures 1.4cm length x 1.7cm width x 0.5cm depth; 1.869cm^2 area and 0.935cm^3 volume. There is Fat Layer (Subcutaneous Tissue) exposed. There is undermining starting at 2:00 and ending at 5:00 with a  maximum distance of 1cm. There is a medium amount of serosanguineous drainage noted. Foul odor after cleansing was noted. There is no granulation within the wound bed. There is a large (67-100%) amount of necrotic tissue within the wound bed including Adherent Slough. Assessment Active Problems ICD-10 Non-pressure chronic ulcer of other part of left lower leg with fat layer exposed Chronic venous hypertension (idiopathic) with ulcer of left lower extremity Lymphedema, not elsewhere classified Type 2 diabetes mellitus with other skin ulcer Chronic diastolic (congestive) heart failure Pressure ulcer of left buttock, unstageable Pressure ulcer of right heel, unstageable Unspecified open wound, left foot, initial encounter Other acute osteomyelitis, left ankle and foot Pressure ulcer of right hip, unstageable Pressure ulcer of right ankle, stage 3 Pressure ulcer of left heel, unstageable Patient's wounds have shown improvement in size and appearance since last clinic visit. Unfortunately she has developed 2 new wounds. 1 to the left heel with eschar throughout and one to the right medial malleolus. I debrided nonviable tissue. I recommended Medihoney and Hydrofera Blue to all wound beds except for the right hip and buttocks wound she can use Dakin's wet-to-dry dressings. It will be important that she aggressively offload these areas or she will continue to develop wounds putting her at risk for infection and sepsis. She is well aware of this. I recommended she use the Prevalon boots. It does not appear that she is using these. She has home health that help change the dressings weekly. I recommend she do the sacral wound and right hip daily. The patient unfortunately does not have a lot of support and son is present during the encounter. Follow-up in 4 weeks. Procedures Wound #11 Pre-procedure diagnosis of Wound #11 is a Pressure Ulcer located on the Right Ischial Tuberosity . There was a  Excisional Skin/Subcutaneous Tissue Debridement with a total area of 3.5 sq cm performed by [name] Shan, MD. With the following instrument(s): Forceps, and Scissors to remove Viable and Non-Viable tissue/material. Material removed includes Subcutaneous Tissue and Slough and. A time out was conducted at 15:56, prior to the start of the procedure. A Minimum amount of bleeding was controlled with Pressure. The procedure was tolerated well. Post Debridement Measurements: 1.4cm length x 2.5cm width x 1.2cm depth; 3.299cm^3 volume. Post debridement Stage noted as Unstageable/Unclassified. Character of Wound/Ulcer Post Debridement is stable. Post procedure Diagnosis Wound #11: Same as Pre-Procedure Wound #13 Pre-procedure diagnosis of Wound #13 is a Pressure Ulcer located on the Left Calcaneus . There was a Selective/Open Wound Non-Viable Tissue Debridement with a total area of 1 sq cm performed by [name] Shan, MD. With the following instrument(s): Curette to remove Viable and Non-Viable tissue/material. Material removed includes Eschar. A time out was conducted at 15:50, prior to the start of the procedure. A Minimum amount of bleeding was controlled with Pressure. The procedure was tolerated well.  Post Debridement Measurements: 2cm length x 2.2cm width x 0.2cm depth; 0.691cm^3 volume. Post debridement Stage noted as Category/Stage II. Character of Wound/Ulcer Post Debridement is stable. Post procedure Diagnosis Wound #13: Same as Pre-Procedure Plan [name], [name] ([id number]) [id number] Page 11 of 13 Follow-up Appointments: Return Appointment in 1 week. [facility] Health: [facility] for [facility] care. May utilize formulary equivalent dressing for wound treatment orders unless otherwise specified. Home Health Nurse may visit PRN to address patientoos wound care needs. Alvis Lemmings [phone] Scheduled days for dressing changes to be completed; exception,  patient has scheduled wound care visit that day. - Home health to change once weekly and son will change dressing once weekly, patient to be seen on Wednesday's in office Bathing/ Shower/ Hygiene: Wash wounds with antibacterial soap and water. No tub bath. Anesthetic (Use 'Patient Medications' Section for Anesthetic Order Entry): Lidocaine applied to wound bed Off-Loading: Other: - wear prevalon heel boots at all times WOUND #10: - Metatarsal head first Wound Laterality: Left Cleanser: Dakin 16 (oz) 0.25 (Generic) 3 x Per Week/30 Days Discharge Instructions: Use as directed. Cleanser: Soap and Water 3 x Per Week/30 Days Discharge Instructions: Gently cleanse wound with antibacterial soap, rinse and pat dry prior to dressing wounds Prim Dressing: Honey: Activon Honey Gel, 25 (g) Tube 3 x Per Week/30 Days ary Prim Dressing: Hydrofera Blue Ready Transfer Foam, 2.5x2.5 (in/in) (Dispense As Written) 3 x Per Week/30 Days ary Discharge Instructions: Apply Hydrofera Blue Ready to wound bed as directed Secondary Dressing: ABD Pad 5x9 (in/in) 3 x Per Week/30 Days Discharge Instructions: Cover with ABD pad Secured With: Medipore T - 74M Medipore H Soft Cloth Surgical T ape ape, 2x2 (in/yd) 3 x Per Week/30 Days WOUND #11: - Ischial Tuberosity Wound Laterality: Right Cleanser: Dakin 16 (oz) 0.25 (Generic) 1 x Per Day/30 Days Discharge Instructions: Use as directed. Cleanser: Soap and Water 1 x Per Day/30 Days Discharge Instructions: Gently cleanse wound with antibacterial soap, rinse and pat dry prior to dressing wounds Prim Dressing: Gauze 1 x Per Day/30 Days ary Discharge Instructions: dakins wet to dry Secondary Dressing: (BORDER) Zetuvit Plus SILICONE BORDER Dressing 5x5 (in/in) (Generic) 1 x Per Day/30 Days Discharge Instructions: Please do not put silicone bordered dressings under wraps. Use non-bordered dressing only. Secured With: Medipore T - 74M Medipore H Soft Cloth Surgical T ape ape,  2x2 (in/yd) (Dispense As Written) 1 x Per Day/30 Days WOUND #12: - Ankle Wound Laterality: Right, Medial Cleanser: Dakin 16 (oz) 0.25 (Generic) 1 x Per Day/30 Days Discharge Instructions: Use as directed. Cleanser: Soap and Water 1 x Per Day/30 Days Discharge Instructions: Gently cleanse wound with antibacterial soap, rinse and pat dry prior to dressing wounds Prim Dressing: Honey: Activon Honey Gel, 25 (g) Tube 1 x Per Day/30 Days ary Secondary Dressing: Hydrofera Blue Ready Transfer Foam, 2.5x2.5 (in/in) 1 x Per Day/30 Days Discharge Instructions: Apply to wound bed over non-stick dressing. Secondary Dressing: (BORDER) Zetuvit Plus SILICONE BORDER Dressing 5x5 (in/in) (Generic) 1 x Per Day/30 Days Discharge Instructions: Please do not put silicone bordered dressings under wraps. Use non-bordered dressing only. Secured With: Medipore T - 74M Medipore H Soft Cloth Surgical T ape ape, 2x2 (in/yd) (Dispense As Written) 1 x Per Day/30 Days WOUND #13: - Calcaneus Wound Laterality: Left Cleanser: Dakin 16 (oz) 0.25 (Generic) 1 x Per Day/30 Days Discharge Instructions: Use as directed. Cleanser: Soap and Water 1 x Per Day/30 Days Discharge Instructions: Gently cleanse wound with antibacterial soap, rinse and  pat dry prior to dressing wounds Prim Dressing: Honey: Activon Honey Gel, 25 (g) Tube 1 x Per Day/30 Days ary Secondary Dressing: Hydrofera Blue Ready Transfer Foam, 2.5x2.5 (in/in) 1 x Per Day/30 Days Discharge Instructions: Apply to wound bed over non-stick dressing. Secondary Dressing: (BORDER) Zetuvit Plus SILICONE BORDER Dressing 5x5 (in/in) (Generic) 1 x Per Day/30 Days Discharge Instructions: Please do not put silicone bordered dressings under wraps. Use non-bordered dressing only. Secured With: Medipore T - 20M Medipore H Soft Cloth Surgical T ape ape, 2x2 (in/yd) (Dispense As Written) 1 x Per Day/30 Days WOUND #6: - Calcaneus Wound Laterality: Right Cleanser: [place] Ancillary Kit - 15  Day Supply (Generic) 3 x Per Week/30 Days Discharge Instructions: Use supplies as instructed; Kit contains: (15) Saline Bullets; (15) 3x3 Gauze; 15 pr Gloves Cleanser: Dakin 16 (oz) 0.25 (Dispense As Written) 3 x Per Week/30 Days Discharge Instructions: Use as directed. Cleanser: Soap and Water 3 x Per Week/30 Days Discharge Instructions: Gently cleanse wound with antibacterial soap, rinse and pat dry prior to dressing wounds Topical: Activon Honey Gel, 25 (g) Tube (Dispense As Written) 3 x Per Week/30 Days Prim Dressing: Hydrofera Blue Ready Transfer Foam, 2.5x2.5 (in/in) 3 x Per Week/30 Days ary Discharge Instructions: Apply Hydrofera Blue Ready to wound bed as directed Secondary Dressing: ABD Pad 5x9 (in/in) 3 x Per Week/30 Days Discharge Instructions: Cover with ABD pad Secondary Dressing: Kerlix 4.5 x 4.1 (in/yd) 3 x Per Week/30 Days Discharge Instructions: Apply Kerlix 4.5 x 4.1 (in/yd) as instructed Secured With: Medipore T - 20M Medipore H Soft Cloth Surgical T ape ape, 2x2 (in/yd) 3 x Per Week/30 Days WOUND #8: - Gluteus Wound Laterality: Left, Distal Cleanser: Dakin 16 (oz) 0.25 (Generic) 1 x Per Day/30 Days Discharge Instructions: Use as directed. Cleanser: Soap and Water 1 x Per Day/30 Days Discharge Instructions: Gently cleanse wound with antibacterial soap, rinse and pat dry prior to dressing wounds Prim Dressing: Gauze 1 x Per Day/30 Days ary Discharge Instructions: moistened with Dakins Solution for wet to dry dressing Secondary Dressing: ABD Pad 5x9 (in/in) 1 x Per Day/30 Days Discharge Instructions: Cover with ABD pad Secured With: Medipore T - 20M Medipore H Soft Cloth Surgical T ape ape, 2x2 (in/yd) 1 x Per Day/30 Days 1. In office sharp debridement 2. Hydrofera Blue and Medihoney 3. Dakin's wet-to-dry dressings 4. Aggressive offloadingooreposition every 1-2 hours, Prevalon boots 5. Follow-up in 4 weeksoopatient knows to call with any questions or  concerns [name], [name] ([id number]) [id number] Page 12 of 13 Electronic Signature(s) Signed: 12/05/2022 4:31:24 PM By: [name] Shan DO Entered By: [name] on 12/05/2022 16:07:56 -------------------------------------------------------------------------------- SuperBill Details Patient Name: Date of Service: [facility], [name] 12/05/2022 Medical Record Number: [id number] Patient Account Number: [id number] Date of Birth/Sex: Treating [name]: 07/14/1941 (82 y.o. [name], [name] Primary Care Provider: Tracie [name] Other Clinician: Massie [name] Referring Provider: Treating Provider/Extender: Arn Medal Weeks in Treatment: 10 Diagnosis Coding ICD-10 Codes Code Description [phone] Non-pressure chronic ulcer of other part of left lower leg with fat layer exposed I87.312 Chronic venous hypertension (idiopathic) with ulcer of left lower extremity I89.0 Lymphedema, not elsewhere classified E11.622 Type 2 diabetes mellitus with other skin ulcer D32.20 Chronic diastolic (congestive) heart failure L89.320 Pressure ulcer of left buttock, unstageable L89.610 Pressure ulcer of right heel, unstageable S91.302A Unspecified open wound, left foot, initial encounter M86.172 Other acute osteomyelitis, left ankle and foot L89.210 Pressure ulcer of right hip, unstageable L89.513 Pressure ulcer of right ankle,  stage 3 L89.620 Pressure ulcer of left heel, unstageable Facility Procedures : CPT4 Code: [id number] Description: 37902 - DEB SUBQ TISSUE 20 SQ CM/< ICD-10 Diagnosis Description L89.210 Pressure ulcer of right hip, unstageable Modifier: Quantity: 1 : CPT4 Code: [id number] Description: 99242 - DEBRIDE WOUND 1ST 20 SQ CM OR < ICD-10 Diagnosis Description L89.620 Pressure ulcer of left heel, unstageable Modifier: Quantity: 1 Physician Procedures : CPT4 Code Description Modifier [id number] 11042 - WC PHYS SUBQ TISS 20 SQ CM ICD-10  Diagnosis Description L89.210 Pressure ulcer of right hip, unstageable Quantity: 1 Electronic Signature(s) Signed: 12/05/2022 4:31:24 PM By: [name] Shan DO Entered By: [name] on 12/05/2022 16:08:56

## 2022-12-06 NOTE — Progress Notes (Signed)
[name] ([id number]) [id number] Page 1 of 17 Visit Report for 12/05/2022 Arrival Information Details Patient Name: Date of Service: [facility], [name]. 12/05/2022 2:45 PM Medical Record Number: [id number] Patient Account Number: [id number] Date of Birth/Sex: Treating RN: 08-14-1941 (82 y.o. [name] Primary Care Kesha Hurrell: Tracie Harrier Other Clinician: Massie Kluver Referring Jazzelle Zhang: Treating Arly Salminen/Extender: Arn Medal Weeks in Treatment: 10 Visit Information History Since Last Visit All ordered tests and consults were completed: No Patient Arrived: Wheel Chair Added or deleted any medications: No Arrival Time: 15:02 Any new allergies or adverse reactions: No Transfer Assistance: EasyPivot Patient Lift Had a fall or experienced change in No Patient Identification Verified: Yes activities of daily living that may affect Secondary Verification Process Completed: Yes risk of falls: Patient Requires Transmission-Based Precautions: No Signs or symptoms of abuse/neglect since last visito No Patient Has Alerts: Yes Hospitalized since last visit: No Patient Alerts: DM II Implantable device outside of the clinic excluding No ABI R 1.30 TBI 1.12 cellular tissue based products placed in the center ABI [name] 1.27 TBI 1.21 since last visit: Has Dressing in Place as Prescribed: Yes Has Compression in Place as Prescribed: Yes Pain Present Now: Yes Electronic Signature(s) Signed: 12/05/2022 5:30:54 PM By: Massie Kluver Entered By: Massie Kluver on 12/05/2022 15:06:48 -------------------------------------------------------------------------------- Clinic Level of Care Assessment Details Patient Name: Date of Service: [facility], [name] 12/05/2022 2:45 PM Medical Record Number: [id number] Patient Account Number: [id number] Date of Birth/Sex: Treating RN: 1940/11/22 (82 y.o. [name] Primary Care Kendahl Bumgardner:  Tracie Harrier Other Clinician: Massie Kluver Referring Glendal Cassaday: Treating Afifa Truax/Extender: Arn Medal Weeks in Treatment: 10 Clinic Level of Care Assessment Items TOOL 1 Quantity Score '[]'$  - 0 Use when EandM and Procedure is performed on INITIAL visit ASSESSMENTS - Nursing Assessment / Reassessment '[]'$  - 0 General Physical Exam (combine w/ comprehensive assessment (listed just below) when performed on new pt. evals) [name], [name] ([id number]) [id number] Page 2 of 17 '[]'$  - 0 Comprehensive Assessment (HX, ROS, Risk Assessments, Wounds Hx, etc.) ASSESSMENTS - Wound and Skin Assessment / Reassessment '[]'$  - 0 Dermatologic / Skin Assessment (not related to wound area) ASSESSMENTS - Ostomy and/or Continence Assessment and Care '[]'$  - 0 Incontinence Assessment and Management '[]'$  - 0 Ostomy Care Assessment and Management (repouching, etc.) PROCESS - Coordination of Care '[]'$  - 0 Simple Patient / Family Education for ongoing care '[]'$  - 0 Complex (extensive) Patient / Family Education for ongoing care '[]'$  - 0 Staff obtains Programmer, systems, Records, T Results / Process Orders est '[]'$  - 0 Staff telephones HHA, Nursing Homes / Clarify orders / etc '[]'$  - 0 Routine Transfer to another Facility (non-emergent condition) '[]'$  - 0 Routine Hospital Admission (non-emergent condition) '[]'$  - 0 New Admissions / Biomedical engineer / Ordering NPWT Apligraf, etc. , '[]'$  - 0 Emergency Hospital Admission (emergent condition) PROCESS - Special Needs '[]'$  - 0 Pediatric / Minor Patient Management '[]'$  - 0 Isolation Patient Management '[]'$  - 0 Hearing / Language / Visual special needs '[]'$  - 0 Assessment of Community assistance (transportation, D/C planning, etc.) '[]'$  - 0 Additional assistance / Altered mentation '[]'$  - 0 Support Surface(s) Assessment (bed, cushion, seat, etc.) INTERVENTIONS - Miscellaneous '[]'$  - 0 External ear exam '[]'$  - 0 Patient Transfer  (multiple staff / Civil Service fast streamer / Similar devices) '[]'$  - 0 Simple Staple / Suture removal (25 or less) '[]'$  - 0 Complex Staple / Suture removal (26 or more) '[]'$  - 0 Hypo/Hyperglycemic  Management (do not check if billed separately) '[]'$  - 0 Ankle / Brachial Index (ABI) - do not check if billed separately Has the patient been seen at the hospital within the last three years: Yes Total Score: 0 Level Of Care: ____ Electronic Signature(s) Signed: 12/05/2022 5:30:54 PM By: Massie Kluver Entered By: Massie Kluver on 12/05/2022 16:22:07 -------------------------------------------------------------------------------- Encounter Discharge Information Details Patient Name: Date of Service: [facility], [name] 12/05/2022 2:45 PM Medical Record Number: [id number] Patient Account Number: [id number] Date of Birth/Sex: Treating RN: Nov 15, 1940 (82 y.o. [name] Primary Care Alanis Clift: Tracie Harrier Other Clinician: [name], [name] ([id number]) [id number] Page 3 of 17 Referring Candiace West: Treating Mel Langan/Extender: Arn Medal Weeks in Treatment: 10 Encounter Discharge Information Items Post Procedure Vitals Discharge Condition: Stable Temperature (F): 98.4 Ambulatory Status: Wheelchair Pulse (bpm): 103 Discharge Destination: Home Respiratory Rate (breaths/min): 16 Transportation: Private Auto Blood Pressure (mmHg): 123/79 Accompanied By: son Schedule Follow-up Appointment: Yes Clinical Summary of Care: Electronic Signature(s) Signed: 12/05/2022 5:30:54 PM By: Massie Kluver Entered By: Massie Kluver on 12/05/2022 17:22:56 -------------------------------------------------------------------------------- Lower Extremity Assessment Details Patient Name: Date of Service: [facility], [name] 12/05/2022 2:45 PM Medical Record Number: [id number] Patient Account Number: [id number] Date of Birth/Sex: Treating RN: May 22, 1941  (82 y.o. [name] Primary Care Ilina Xu: Tracie Harrier Other Clinician: Massie Kluver Referring Twisha Vanpelt: Treating Ky Rumple/Extender: Arn Medal Weeks in Treatment: 10 Edema Assessment Assessed: [Left: No] [Right: No] [Left: Edema] [Right: :] Calf Left: Right: Point of Measurement: 27 cm From Medial Instep Ankle Left: Right: Point of Measurement: 9 cm From Medial Instep Electronic Signature(s) Signed: 12/05/2022 5:30:54 PM By: Massie Kluver Signed: 12/05/2022 5:47:34 PM By: Gretta Cool, BSN, RN, CWS, Kim RN, BSN Entered By: Massie Kluver on 12/05/2022 15:43:29 Multi Wound Chart Details -------------------------------------------------------------------------------- [name] ([id number]) [id number] Page 4 of 17 Patient Name: Date of Service: [facility], [name] 12/05/2022 2:45 PM Medical Record Number: [id number] Patient Account Number: [id number] Date of Birth/Sex: Treating RN: 1941/05/05 (82 y.o. Charolette Forward, Kim Primary Care Jaton Eilers: Tracie Harrier Other Clinician: Massie Kluver Referring Jordon Bourquin: Treating Laneya Gasaway/Extender: Arn Medal Weeks in Treatment: 10 Vital Signs Height(in): Pulse(bpm): 103 Weight(lbs): 116 Blood Pressure(mmHg): 123/79 Body Mass Index(BMI): Temperature(F): 98.4 Respiratory Rate(breaths/min): 16 Wound Assessments Wound Number: '10 11 12 '$ Photos: No Photos No Photos No Photos Left Metatarsal head first Right Ischial Tuberosity Right, Medial Ankle Wound Location: Pressure Injury Pressure Injury Pressure Injury Wounding Event: Pressure Ulcer Pressure Ulcer Pressure Ulcer Primary Etiology: Cataracts, Lymphedema, Cataracts, Lymphedema, Cataracts, Lymphedema, Comorbid History: Hypertension, Peripheral Arterial Hypertension, Peripheral Arterial Hypertension, Peripheral Arterial Disease, Peripheral Venous Disease, Disease, Peripheral Venous Disease,  Disease, Peripheral Venous Disease, Type II Diabetes, Osteoarthritis, Type II Diabetes, Osteoarthritis, Type II Diabetes, Osteoarthritis, Neuropathy Neuropathy Neuropathy 10/23/2022 10/24/2022 12/05/2022 Date Acquired: 6 5 0 Weeks of Treatment: Open Open Open Wound Status: No No No Wound Recurrence: 1.2x1.4x0.1 1.4x2.5x1.2 2x2.5x0.1 Measurements [name] x W x D (cm) 1.319 2.749 3.927 A (cm) : rea 0.132 3.299 0.393 Volume (cm) : 66.40% -32.60% N/A % Reduction in A rea: 83.20% -1493.70% N/A % Reduction in Volume: 12 Starting Position 1 (o'clock): 6 Ending Position 1 (o'clock): 1.7 Maximum Distance 1 (cm): N/A Yes N/A Undermining: Category/Stage IV Unstageable/Unclassified Category/Stage I Classification: Large None Present Medium Exudate A mount: Serosanguineous N/A Serosanguineous Exudate Type: red, brown N/A red, brown Exudate Color: No N/A No Foul Odor A Cleansing: fter N/A N/A N/A Odor A nticipated Due to Product Use: Flat  and Intact Flat and Intact Flat and Intact Wound Margin: None Present (0%) N/A N/A Granulation A mount: Medium (34-66%) N/A N/A Necrotic A mount: Necrosis of Bone Eschar N/A Necrotic Tissue: Fat Layer (Subcutaneous Tissue): Yes Fat Layer (Subcutaneous Tissue): Yes Fat Layer (Subcutaneous Tissue): Yes Exposed Structures: Bone: Yes Fascia: No Fascia: No Fascia: No Tendon: No Tendon: No Tendon: No Muscle: No Muscle: No Muscle: No Joint: No Joint: No Joint: No Bone: No Bone: No None None None Epithelialization: Wound Number: '13 6 8 '$ Photos: No Photos No Photos No Photos Left Calcaneus Right Calcaneus Left, Distal Gluteus Wound Location: Pressure Injury Pressure Injury Pressure Injury Wounding Event: Pressure Ulcer Pressure Ulcer Pressure Ulcer Primary Etiology: Cataracts, Lymphedema, Cataracts, Lymphedema, Cataracts, Lymphedema, Comorbid History: Hypertension, Peripheral Arterial Hypertension, Peripheral Arterial  Hypertension, Peripheral Arterial Disease, Peripheral Venous Disease, Disease, Peripheral Venous Disease, Disease, Peripheral Venous Disease, Type II Diabetes, Osteoarthritis, Type II Diabetes, Osteoarthritis, Type II Diabetes, Osteoarthritis, Neuropathy Neuropathy Neuropathy 11/28/2022 07/23/2022 07/23/2022 Date Acquired: 0 10 10 Weeks of Treatment: Open Open Open Wound Status: No No No Wound Recurrence: 2x2.2x0.1 3x2.3x0.1 1.4x1.7x0.5 Measurements [name] x W x D (cm) 3.456 5.419 1.869 A (cm) : rea 0.346 0.542 0.935 Volume (cm) : N/A 42.50% 66.00% % Reduction in A rea: N/A 80.80% 43.30% % Reduction in Volume: 2 Starting Position 1 (o'clock): 5 Ending Position 1 (o'clock): 1 Maximum Distance 1 (cm): N/A N/A Yes Undermining: Category/Stage II Unstageable/Unclassified Unstageable/Unclassified Classification: Medium Medium Medium Exudate A mount: [name], [name] ([id number]) [id number] Page 5 of 17 Serosanguineous Serosanguineous Serosanguineous Exudate Type: red, brown red, brown red, brown Exudate Color: No Yes Yes Foul Odor A Cleansing: fter N/A No No Odor A nticipated Due to Product Use: Flat and Intact N/A N/A Wound Margin: None Present (0%) None Present (0%) None Present (0%) Granulation A mount: Large (67-100%) Large (67-100%) Large (67-100%) Necrotic A mount: Eschar Eschar Adherent Slough Necrotic Tissue: Fat Layer (Subcutaneous Tissue): Yes Fat Layer (Subcutaneous Tissue): Yes Fat Layer (Subcutaneous Tissue): Yes Exposed Structures: Fascia: No Fascia: No Fascia: No Tendon: No Tendon: No Tendon: No Muscle: No Muscle: No Muscle: No Joint: No Joint: No Joint: No Bone: No Bone: No Bone: No None None None Epithelialization: Treatment Notes Electronic Signature(s) Signed: 12/05/2022 5:30:54 PM By: Massie Kluver Entered By: Massie Kluver on 12/05/2022  15:43:41 -------------------------------------------------------------------------------- Multi-Disciplinary Care Plan Details Patient Name: Date of Service: [facility], [name] 12/05/2022 2:45 PM Medical Record Number: [id number] Patient Account Number: [id number] Date of Birth/Sex: Treating RN: 07/01/1941 (82 y.o. [name] Primary Care Aubrey Voong: Tracie Harrier Other Clinician: Massie Kluver Referring Janasia Coverdale: Treating Brinly Maietta/Extender: Arn Medal Weeks in Treatment: 10 Active Inactive Necrotic Tissue Nursing Diagnoses: Impaired tissue integrity related to necrotic/devitalized tissue Knowledge deficit related to management of necrotic/devitalized tissue Goals: Necrotic/devitalized tissue will be minimized in the wound bed Date Initiated: 10/31/2022 Target Resolution Date: 11/07/2022 Goal Status: Active Patient/caregiver will verbalize understanding of reason and process for debridement of necrotic tissue Date Initiated: 10/31/2022 Target Resolution Date: 10/31/2022 Goal Status: Active Interventions: Assess patient pain level pre-, during and post procedure and prior to discharge Provide education on necrotic tissue and debridement process Treatment Activities: Apply topical anesthetic as ordered : 10/31/2022 Biologic debridement : 10/31/2022 Notes: Osteomyelitis Nursing Diagnoses: IDALIE, CANTO ([id number]) [id number] Page 6 of 17 Potential for infection: osteomyelitis Goals: Patient/caregiver will verbalize understanding of disease process and disease management Date Initiated: 10/31/2022 Target Resolution Date: 10/31/2022 Goal Status: Active Signs and symptoms for osteomyelitis will be  recognized and promptly addressed Date Initiated: 10/31/2022 Target Resolution Date: 10/31/2022 Goal Status: Active Interventions: Assess for signs and symptoms of osteomyelitis resolution every visit Treatment  Activities: Systemic antibiotics : 10/31/2022 Notes: Soft Tissue Infection Nursing Diagnoses: Impaired tissue integrity Potential for infection: soft tissue Goals: Patient/caregiver will verbalize understanding of or measures to prevent infection and contamination in the home setting Date Initiated: 10/31/2022 Target Resolution Date: 10/31/2022 Goal Status: Active Signs and symptoms of infection will be recognized early to allow for prompt treatment Date Initiated: 10/31/2022 Target Resolution Date: 10/31/2022 Goal Status: Active Interventions: Assess signs and symptoms of infection every visit Notes: Wound/Skin Impairment Nursing Diagnoses: Impaired tissue integrity Goals: Patient/caregiver will verbalize understanding of skin care regimen Date Initiated: 10/31/2022 Date Inactivated: 11/06/2022 Target Resolution Date: 10/31/2022 Goal Status: Met Ulcer/skin breakdown will have a volume reduction of 30% by week 4 Date Initiated: 10/31/2022 Date Inactivated: 11/06/2022 Target Resolution Date: 10/31/2022 Unmet Reason: comorbidities, failure to Goal Status: Unmet thrive Ulcer/skin breakdown will have a volume reduction of 50% by week 8 Date Initiated: 11/06/2022 Target Resolution Date: 11/28/2022 Goal Status: Active Interventions: Assess patient/caregiver ability to perform ulcer/skin care regimen upon admission and as needed Assess ulceration(s) every visit Provide education on ulcer and skin care Treatment Activities: Skin care regimen initiated : 10/31/2022 Topical wound management initiated : 10/31/2022 Notes: Electronic Signature(s) Signed: 12/05/2022 5:30:54 PM By: Massie Kluver Signed: 12/05/2022 5:47:34 PM By: Gretta Cool, BSN, RN, CWS, Kim RN, BSN Entered By: Massie Kluver on 12/05/2022 17:21:52 [name], [name] ([id number]) [id number] Page 7 of 17 -------------------------------------------------------------------------------- Pain  Assessment Details Patient Name: Date of Service: [facility], [name] 12/05/2022 2:45 PM Medical Record Number: [id number] Patient Account Number: [id number] Date of Birth/Sex: Treating RN: 1941/05/27 (82 y.o. [name] Primary Care Natasja Niday: Tracie Harrier Other Clinician: Massie Kluver Referring Hitesh Fouche: Treating Jaymon Dudek/Extender: Arn Medal Weeks in Treatment: 10 Active Problems Location of Pain Severity and Description of Pain Patient Has Paino Yes Site Locations Pain Location: Pain in Ulcers Duration of the Pain. Constant / Intermittento Constant Rate the pain. Current Pain Level: 6 Character of Pain Describe the Pain: Other: feels sore Pain Management and Medication Current Pain Management: Medication: Yes Cold Application: No Rest: No Massage: No Activity: No T.E.N.S.: No Heat Application: No Leg drop or elevation: No Is the Current Pain Management Adequate: Inadequate How does your wound impact your activities of daily livingo Sleep: No Bathing: No Appetite: No Relationship With Others: No Bladder Continence: No Emotions: No Bowel Continence: No Work: No Toileting: No Drive: No Dressing: No Hobbies: No Electronic Signature(s) Signed: 12/05/2022 5:30:54 PM By: Massie Kluver Signed: 12/05/2022 5:47:34 PM By: Gretta Cool, BSN, RN, CWS, Kim RN, BSN Entered By: Massie Kluver on 12/05/2022 15:09:23 Giacobbe, [name] ([id number]) [id number] Page 8 of 17 -------------------------------------------------------------------------------- Patient/Caregiver Education Details Patient Name: Date of Service: [facility], [name] 1/24/2024andnbsp2:45 PM Medical Record Number: [id number] Patient Account Number: [id number] Date of Birth/Gender: Treating RN: 1941-06-28 (82 y.o. [name] Primary Care Physician: Tracie Harrier Other Clinician: Massie Kluver Referring Physician: Treating  Physician/Extender: Arn Medal Weeks in Treatment: 10 Education Assessment Education Provided To: Patient Education Topics Provided Wound/Skin Impairment: Handouts: Other: continue wound care as directed Methods: Explain/Verbal Responses: State content correctly Electronic Signature(s) Signed: 12/05/2022 5:30:54 PM By: Massie Kluver Entered By: Massie Kluver on 12/05/2022 17:21:47 -------------------------------------------------------------------------------- Wound Assessment Details Patient Name: Date of Service: [facility], [name] 12/05/2022 2:45 PM Medical Record Number: [id number] Patient Account Number:  [id number] Date of Birth/Sex: Treating RN: 12-06-1940 (82 y.o. [name] Primary Care Ilyanna Baillargeon: Tracie Harrier Other Clinician: Massie Kluver Referring Yalitza Teed: Treating Sumner Boesch/Extender: Arn Medal Weeks in Treatment: 10 Wound Status Wound Number: 10 Primary Pressure Ulcer Etiology: Wound Location: Left Metatarsal head first Wound Open Wounding Event: Pressure Injury Status: Date Acquired: 10/23/2022 Comorbid Cataracts, Lymphedema, Hypertension, Peripheral Arterial Disease, Weeks Of Treatment: 6 History: Peripheral Venous Disease, Type II Diabetes, Osteoarthritis, Clustered Wound: No Neuropathy Photos [name], [name] ([id number]) [id number] Page 9 of 17 Wound Measurements Length: (cm) 1.2 Width: (cm) 1.4 Depth: (cm) 0.1 Area: (cm) 1.319 Volume: (cm) 0.132 % Reduction in Area: 66.4% % Reduction in Volume: 83.2% Epithelialization: None Wound Description Classification: Category/Stage IV Wound Margin: Flat and Intact Exudate Amount: Large Exudate Type: Serosanguineous Exudate Color: red, brown Foul Odor After Cleansing: No Slough/Fibrino No Wound Bed Granulation Amount: None Present (0%) Exposed Structure Necrotic Amount: Medium (34-66%) Fascia Exposed:  No Necrotic Quality: Bone Fat Layer (Subcutaneous Tissue) Exposed: Yes Tendon Exposed: No Muscle Exposed: No Joint Exposed: No Bone Exposed: Yes Treatment Notes Wound #10 (Metatarsal head first) Wound Laterality: Left Cleanser Dakin 16 (oz) 0.25 Discharge Instruction: Use as directed. Soap and Water Discharge Instruction: Gently cleanse wound with antibacterial soap, rinse and pat dry prior to dressing wounds Peri-Wound Care Topical Primary Dressing Honey: Activon Honey Gel, 25 (g) Tube Hydrofera Blue Ready Transfer Foam, 2.5x2.5 (in/in) Discharge Instruction: Apply Hydrofera Blue Ready to wound bed as directed Secondary Dressing ABD Pad 5x9 (in/in) Discharge Instruction: Cover with ABD pad Secured With [employer] T ape ape, 2x2 (in/yd) Compression Wrap Compression Stockings Add-Ons Electronic Signature(s) Signed: 12/05/2022 5:30:54 PM By: Massie Kluver Signed: 12/05/2022 5:47:34 PM By: Gretta Cool, BSN, RN, CWS, Kim RN, BSN Entered By: Massie Kluver on 12/05/2022 15:45:33 [name], [name] ([id number]) [id number] Page 10 of 17 -------------------------------------------------------------------------------- Wound Assessment Details Patient Name: Date of Service: [facility], [name] 12/05/2022 2:45 PM Medical Record Number: [id number] Patient Account Number: [id number] Date of Birth/Sex: Treating RN: 05-27-1941 (82 y.o. Charolette Forward, Kim Primary Care Rosangela Fehrenbach: Tracie Harrier Other Clinician: Massie Kluver Referring Kawana Hegel: Treating Tregan Read/Extender: Arn Medal Weeks in Treatment: 10 Wound Status Wound Number: 11 Primary Pressure Ulcer Etiology: Wound Location: Right Ischial Tuberosity Wound Open Wounding Event: Pressure Injury Status: Date Acquired: 10/24/2022 Comorbid Cataracts, Lymphedema, Hypertension, Peripheral Arterial Disease, Weeks Of Treatment: 5 History: Peripheral  Venous Disease, Type II Diabetes, Osteoarthritis, Clustered Wound: No Neuropathy Photos Wound Measurements Length: (cm) 1 Width: (cm) 2 Depth: (cm) 1 Area: (cm) Volume: (cm) .4 % Reduction in Area: -32.6% .5 % Reduction in Volume: -1493.7% .2 Epithelialization: None 2.749 Undermining: Yes 3.299 Starting Position (o'clock): 12 Ending Position (o'clock): 6 Maximum Distance: (cm) 1.7 Wound Description Classification: Unstageable/Unclassified Wound Margin: Flat and Intact Exudate Amount: None Present Wound Bed Necrotic Amount: Medium (34-66%) Exposed Structure Necrotic Quality: Eschar Fascia Exposed: No Fat Layer (Subcutaneous Tissue) Exposed: Yes Tendon Exposed: No Muscle Exposed: No Joint Exposed: No Bone Exposed: No Treatment Notes Wound #11 (Ischial Tuberosity) Wound Laterality: Right Cleanser Dakin 16 (oz) 0.25 Discharge Instruction: Use as directed. [name], [name] ([id number]) [id number] Page 11 of 17 Soap and Water Discharge Instruction: Gently cleanse wound with antibacterial soap, rinse and pat dry prior to dressing wounds Peri-Wound Care Topical Primary Dressing Gauze Discharge Instruction: dakins wet to dry Secondary Dressing (BORDER) Zetuvit Plus SILICONE BORDER Dressing 5x5 (in/in) Discharge Instruction: Please do not put silicone bordered dressings under  wraps. Use non-bordered dressing only. Secured With [place] H Soft Cloth Surgical T ape ape, 2x2 (in/yd) Compression Wrap Compression Stockings Add-Ons Electronic Signature(s) Signed: 12/05/2022 5:30:54 PM By: Massie Kluver Signed: 12/05/2022 5:47:34 PM By: Gretta Cool, BSN, RN, CWS, Kim RN, BSN Entered By: Massie Kluver on 12/05/2022 15:45:55 -------------------------------------------------------------------------------- Wound Assessment Details Patient Name: Date of Service: [name], [name] 12/05/2022 2:45 PM Medical Record Number:  [id number] Patient Account Number: [id number] Date of Birth/Sex: Treating RN: January 19, 1941 (82 y.o. Charolette Forward, Kim Primary Care Soley Harriss: Tracie Harrier Other Clinician: Massie Kluver Referring Clemmie Marxen: Treating Hailly Fess/Extender: Arn Medal Weeks in Treatment: 10 Wound Status Wound Number: 12 Primary Pressure Ulcer Etiology: Wound Location: Right, Medial Ankle Wound Open Wounding Event: Pressure Injury Status: Date Acquired: 12/05/2022 Comorbid Cataracts, Lymphedema, Hypertension, Peripheral Arterial Disease, Weeks Of Treatment: 0 History: Peripheral Venous Disease, Type II Diabetes, Osteoarthritis, Clustered Wound: No Neuropathy Photos Wound Measurements Length: (cm) 2 G[place]venor, Zahria [name] ([id number]) Width: (cm) 2.5 Depth: (cm) 0.1 Area: (cm) 3.927 Volume: (cm) 0.393 % Reduction in Area: [id number] Page 12 of 17 % Reduction in Volume: Epithelialization: None Wound Description Classification: Category/Stage I Wound Margin: Flat and Intact Exudate Amount: Medium Exudate Type: Serosanguineous Exudate Color: red, brown Foul Odor After Cleansing: No Slough/Fibrino Yes Wound Bed Exposed Structure Fascia Exposed: No Fat Layer (Subcutaneous Tissue) Exposed: Yes Tendon Exposed: No Muscle Exposed: No Joint Exposed: No Bone Exposed: No Treatment Notes Wound #12 (Ankle) Wound Laterality: Right, Medial Cleanser Dakin 16 (oz) 0.25 Discharge Instruction: Use as directed. Soap and Water Discharge Instruction: Gently cleanse wound with antibacterial soap, rinse and pat dry prior to dressing wounds Peri-Wound Care Topical Primary Dressing Honey: Activon Honey Gel, 25 (g) Tube Secondary Dressing Hydrofera Blue Ready Transfer Foam, 2.5x2.5 (in/in) Discharge Instruction: Apply to wound bed over non-stick dressing. (BORDER) Zetuvit Plus SILICONE BORDER Dressing 5x5 (in/in) Discharge Instruction: Please do not put silicone  bordered dressings under wraps. Use non-bordered dressing only. Secured With [place] H Soft Cloth Surgical T ape ape, 2x2 (in/yd) Compression Wrap Compression Stockings Add-Ons Electronic Signature(s) Signed: 12/05/2022 5:30:54 PM By: Massie Kluver Signed: 12/05/2022 5:47:34 PM By: Gretta Cool, BSN, RN, CWS, Kim RN, BSN Entered By: Massie Kluver on 12/05/2022 15:46:21 -------------------------------------------------------------------------------- Wound Assessment Details Patient Name: Date of Service: [facility], [name] 12/05/2022 2:45 PM Medical Record Number: [id number] Patient Account Number: [id number] [name], [name] ([id number]) [id number] Page 13 of 17 Date of Birth/Sex: Treating RN: Dec 14, 1940 (82 y.o. Charolette Forward, Kim Primary Care Markeis Allman: Tracie Harrier Other Clinician: Massie Kluver Referring Umer Harig: Treating Selyna Klahn/Extender: Arn Medal Weeks in Treatment: 10 Wound Status Wound Number: 13 Primary Pressure Ulcer Etiology: Wound Location: Left Calcaneus Wound Open Wounding Event: Pressure Injury Status: Date Acquired: 11/28/2022 Comorbid Cataracts, Lymphedema, Hypertension, Peripheral Arterial Disease, Weeks Of Treatment: 0 History: Peripheral Venous Disease, Type II Diabetes, Osteoarthritis, Clustered Wound: No Neuropathy Photos Wound Measurements Length: (cm) 2 Width: (cm) 2.2 Depth: (cm) 0.1 Area: (cm) 3.456 Volume: (cm) 0.346 % Reduction in Area: % Reduction in Volume: Epithelialization: None Wound Description Classification: Category/Stage II Wound Margin: Flat and Intact Exudate Amount: Medium Exudate Type: Serosanguineous Exudate Color: red, brown Foul Odor After Cleansing: No Slough/Fibrino Yes Wound Bed Granulation Amount: None Present (0%) Exposed Structure Necrotic Amount: Large (67-100%) Fascia Exposed: No Necrotic Quality: Eschar Fat Layer (Subcutaneous Tissue)  Exposed: Yes Tendon Exposed: No Muscle Exposed: No Joint Exposed: No Bone Exposed: No Treatment Notes Wound #13 (Calcaneus) Wound  Laterality: Left Cleanser Dakin 16 (oz) 0.25 Discharge Instruction: Use as directed. Soap and Water Discharge Instruction: Gently cleanse wound with antibacterial soap, rinse and pat dry prior to dressing wounds Peri-Wound Care Topical Primary Dressing Honey: Activon Honey Gel, 25 (g) Tube Secondary Dressing Hydrofera Blue Ready Transfer Foam, 2.5x2.5 (in/in) Discharge Instruction: Apply to wound bed over non-stick dressing. (BORDER) Zetuvit Plus SILICONE BORDER Dressing 5x5 (in/in) Discharge Instruction: Please do not put silicone bordered dressings under wraps. Use non-bordered dressing only. [name], [name] ([id number]) [id number] Page 14 of [place] [employer] H Soft Cloth Surgical T ape ape, 2x2 (in/yd) Compression Wrap Compression Stockings Environmental education officer) Signed: 12/05/2022 5:30:54 PM By: Massie Kluver Signed: 12/05/2022 5:47:34 PM By: Gretta Cool, BSN, RN, CWS, Kim RN, BSN Entered By: Massie Kluver on 12/05/2022 15:46:39 -------------------------------------------------------------------------------- Wound Assessment Details Patient Name: Date of Service: [facility], [name] 12/05/2022 2:45 PM Medical Record Number: [id number] Patient Account Number: [id number] Date of Birth/Sex: Treating RN: Jul 01, 1941 (82 y.o. Charolette Forward, Kim Primary Care Sy Saintjean: Tracie Harrier Other Clinician: Massie Kluver Referring Brave Dack: Treating Yeng Perz/Extender: Arn Medal Weeks in Treatment: 10 Wound Status Wound Number: 6 Primary Pressure Ulcer Etiology: Wound Location: Right Calcaneus Wound Open Wounding Event: Pressure Injury Status: Date Acquired: 07/23/2022 Comorbid Cataracts, Lymphedema, Hypertension, Peripheral Arterial Disease, Weeks Of Treatment:  10 History: Peripheral Venous Disease, Type II Diabetes, Osteoarthritis, Clustered Wound: No Neuropathy Photos Wound Measurements Length: (cm) 3 Width: (cm) 2.3 Depth: (cm) 0.1 Area: (cm) 5.419 Volume: (cm) 0.542 % Reduction in Area: 42.5% % Reduction in Volume: 80.8% Epithelialization: None Wound Description Classification: Unstageable/Unclassified Exudate Amount: Medium Exudate Type: Serosanguineous Exudate Color: red, brown Foul Odor After Cleansing: Yes Due to Product Use: No Slough/Fibrino Yes Wound Bed Granulation Amount: None Present (0%) Exposed Structure Necrotic Amount: Large (67-100%) Fascia Exposed: No [name], [name] ([id number]) [id number] Page 15 of 17 Necrotic Quality: Eschar Fat Layer (Subcutaneous Tissue) Exposed: Yes Tendon Exposed: No Muscle Exposed: No Joint Exposed: No Bone Exposed: No Treatment Notes Wound #6 (Calcaneus) Wound Laterality: Right Cleanser [place] Ancillary Kit - 15 Day Supply Discharge Instruction: Use supplies as instructed; Kit contains: (15) Saline Bullets; (15) 3x3 Gauze; 15 pr Gloves Dakin 16 (oz) 0.25 Discharge Instruction: Use as directed. Soap and Water Discharge Instruction: Gently cleanse wound with antibacterial soap, rinse and pat dry prior to dressing wounds Peri-Wound Care Topical Activon Honey Gel, 25 (g) Tube Primary Dressing Hydrofera Blue Ready Transfer Foam, 2.5x2.5 (in/in) Discharge Instruction: Apply Hydrofera Blue Ready to wound bed as directed Secondary Dressing ABD Pad 5x9 (in/in) Discharge Instruction: Cover with ABD pad Kerlix 4.5 x 4.1 (in/yd) Discharge Instruction: Apply Kerlix 4.5 x 4.1 (in/yd) as instructed Secured With [place] H Soft Cloth Surgical T ape ape, 2x2 (in/yd) Compression Wrap Compression Stockings Add-Ons Electronic Signature(s) Signed: 12/05/2022 5:30:54 PM By: Massie Kluver Signed: 12/05/2022 5:47:34 PM By: Gretta Cool, BSN, RN, CWS,  Kim RN, BSN Entered By: Massie Kluver on 12/05/2022 15:47:00 -------------------------------------------------------------------------------- Wound Assessment Details Patient Name: Date of Service: [facility], [name] 12/05/2022 2:45 PM Medical Record Number: [id number] Patient Account Number: [id number] Date of Birth/Sex: Treating RN: May 21, 1941 (82 y.o. [name] Primary Care Vasilios Ottaway: Tracie Harrier Other Clinician: Massie Kluver Referring Zackariah Vanderpol: Treating Anaria Kroner/Extender: Arn Medal Weeks in Treatment: 10 Wound Status Wound Number: 8 Primary Pressure Ulcer Etiology: Wound Location: Left, Distal Gluteus Wound Open Wounding Event: Pressure Injury Status: Date Acquired: 07/23/2022 Comorbid Cataracts, Lymphedema, Hypertension,  Peripheral Arterial Disease, Weeks Of Treatment: 10 [name], [name] ([id number]) [id number] Page 16 of 17 Weeks Of Treatment: 10 History: Peripheral Venous Disease, Type II Diabetes, Osteoarthritis, Clustered Wound: No Neuropathy Photos Wound Measurements Length: (cm) 1.4 Width: (cm) 1.7 Depth: (cm) 0.5 Area: (cm) 1.869 Volume: (cm) 0.935 % Reduction in Area: 66% % Reduction in Volume: 43.3% Epithelialization: None Undermining: Yes Starting Position (o'clock): 2 Ending Position (o'clock): 5 Maximum Distance: (cm) 1 Wound Description Classification: Unstageable/Unclassified Exudate Amount: Medium Exudate Type: Serosanguineous Exudate Color: red, brown Foul Odor After Cleansing: Yes Due to Product Use: No Slough/Fibrino Yes Wound Bed Granulation Amount: None Present (0%) Exposed Structure Necrotic Amount: Large (67-100%) Fascia Exposed: No Necrotic Quality: Adherent Slough Fat Layer (Subcutaneous Tissue) Exposed: Yes Tendon Exposed: No Muscle Exposed: No Joint Exposed: No Bone Exposed: No Electronic Signature(s) Signed: 12/05/2022 5:30:54 PM By: Massie Kluver Signed:  12/05/2022 5:47:34 PM By: Gretta Cool, BSN, RN, CWS, Kim RN, BSN Entered By: Massie Kluver on 12/05/2022 15:48:04 -------------------------------------------------------------------------------- Vitals Details Patient Name: Date of Service: [facility], [name] 12/05/2022 2:45 PM Medical Record Number: [id number] Patient Account Number: [id number] Date of Birth/Sex: Treating RN: 12-05-1940 (82 y.o. [name] Primary Care Devony Mcgrady: Tracie Harrier Other Clinician: Massie Kluver Referring Alisyn Lequire: Treating Ica Daye/Extender: Arn Medal Weeks in Treatment: 10 Vital Signs Time Taken: 15:07 Temperature (F): 98.4 Weight (lbs): 116 Pulse (bpm): 103 [name], [name] ([id number]) [id number] Page 17 of 17 Respiratory Rate (breaths/min): 16 Blood Pressure (mmHg): 123/79 Reference Range: 80 - 120 mg / dl Electronic Signature(s) Signed: 12/05/2022 5:30:54 PM By: Massie Kluver Entered By: Massie Kluver on 12/05/2022 15:09:19

## 2022-12-13 DIAGNOSIS — L89323 Pressure ulcer of left buttock, stage 3: Secondary | ICD-10-CM | POA: Diagnosis not present

## 2022-12-13 DIAGNOSIS — L89893 Pressure ulcer of other site, stage 3: Secondary | ICD-10-CM | POA: Diagnosis not present

## 2022-12-13 DIAGNOSIS — I872 Venous insufficiency (chronic) (peripheral): Secondary | ICD-10-CM | POA: Diagnosis not present

## 2022-12-13 DIAGNOSIS — L89610 Pressure ulcer of right heel, unstageable: Secondary | ICD-10-CM | POA: Diagnosis not present

## 2022-12-14 DIAGNOSIS — E1140 Type 2 diabetes mellitus with diabetic neuropathy, unspecified: Secondary | ICD-10-CM | POA: Diagnosis not present

## 2022-12-14 DIAGNOSIS — N1330 Unspecified hydronephrosis: Secondary | ICD-10-CM | POA: Diagnosis not present

## 2022-12-14 DIAGNOSIS — M1A9XX0 Chronic gout, unspecified, without tophus (tophi): Secondary | ICD-10-CM | POA: Diagnosis not present

## 2022-12-14 DIAGNOSIS — E1151 Type 2 diabetes mellitus with diabetic peripheral angiopathy without gangrene: Secondary | ICD-10-CM | POA: Diagnosis not present

## 2022-12-14 DIAGNOSIS — I890 Lymphedema, not elsewhere classified: Secondary | ICD-10-CM | POA: Diagnosis not present

## 2022-12-14 DIAGNOSIS — M5410 Radiculopathy, site unspecified: Secondary | ICD-10-CM | POA: Diagnosis not present

## 2022-12-14 DIAGNOSIS — G8929 Other chronic pain: Secondary | ICD-10-CM | POA: Diagnosis not present

## 2022-12-14 DIAGNOSIS — M1712 Unilateral primary osteoarthritis, left knee: Secondary | ICD-10-CM | POA: Diagnosis not present

## 2022-12-14 DIAGNOSIS — I82412 Acute embolism and thrombosis of left femoral vein: Secondary | ICD-10-CM | POA: Diagnosis not present

## 2022-12-14 DIAGNOSIS — I110 Hypertensive heart disease with heart failure: Secondary | ICD-10-CM | POA: Diagnosis not present

## 2022-12-14 DIAGNOSIS — I82422 Acute embolism and thrombosis of left iliac vein: Secondary | ICD-10-CM | POA: Diagnosis not present

## 2022-12-14 DIAGNOSIS — I70201 Unspecified atherosclerosis of native arteries of extremities, right leg: Secondary | ICD-10-CM | POA: Diagnosis not present

## 2022-12-14 DIAGNOSIS — G9341 Metabolic encephalopathy: Secondary | ICD-10-CM | POA: Diagnosis not present

## 2022-12-14 DIAGNOSIS — M792 Neuralgia and neuritis, unspecified: Secondary | ICD-10-CM | POA: Diagnosis not present

## 2022-12-14 DIAGNOSIS — I700 Atherosclerosis of aorta: Secondary | ICD-10-CM | POA: Diagnosis not present

## 2022-12-14 DIAGNOSIS — M47812 Spondylosis without myelopathy or radiculopathy, cervical region: Secondary | ICD-10-CM | POA: Diagnosis not present

## 2022-12-14 DIAGNOSIS — I5032 Chronic diastolic (congestive) heart failure: Secondary | ICD-10-CM | POA: Diagnosis not present

## 2022-12-14 DIAGNOSIS — E039 Hypothyroidism, unspecified: Secondary | ICD-10-CM | POA: Diagnosis not present

## 2022-12-14 DIAGNOSIS — M8580 Other specified disorders of bone density and structure, unspecified site: Secondary | ICD-10-CM | POA: Diagnosis not present

## 2022-12-14 DIAGNOSIS — L89892 Pressure ulcer of other site, stage 2: Secondary | ICD-10-CM | POA: Diagnosis not present

## 2022-12-14 DIAGNOSIS — L89213 Pressure ulcer of right hip, stage 3: Secondary | ICD-10-CM | POA: Diagnosis not present

## 2022-12-14 DIAGNOSIS — L89154 Pressure ulcer of sacral region, stage 4: Secondary | ICD-10-CM | POA: Diagnosis not present

## 2022-12-14 DIAGNOSIS — E7800 Pure hypercholesterolemia, unspecified: Secondary | ICD-10-CM | POA: Diagnosis not present

## 2022-12-14 DIAGNOSIS — L89613 Pressure ulcer of right heel, stage 3: Secondary | ICD-10-CM | POA: Diagnosis not present

## 2022-12-16 ENCOUNTER — Telehealth: Payer: Self-pay | Admitting: Urology

## 2022-12-16 NOTE — Telephone Encounter (Signed)
Please call [name] and have her reschedule her missed appointment from November 28, 2022 with me for repeat UA, OAB questionnaire and PVR.

## 2022-12-17 ENCOUNTER — Encounter (INDEPENDENT_AMBULATORY_CARE_PROVIDER_SITE_OTHER): Payer: Self-pay | Admitting: Nurse Practitioner

## 2022-12-17 NOTE — Progress Notes (Signed)
Subjective:    Patient ID: [name], female    DOB: Aug 27, 1941, 82 y.o.   MRN: [id number] Chief Complaint  Patient presents with   Hospitalization Follow-up    [name] is a 82 year old female who presents today for evaluation following left lower extremity thrombectomy and IVC filter placement.  The patient was found to have a DVT in the midst of workup after hospitalization for sepsis.  Patient also has multiple wounds on her sacral as well as her foot area.  In addition to having procedures for these patient was also at an increased bleeding risk.  Because of this the patient was not anticoagulated post leaving the hospital.  She notes that the leg is feeling much better.  Her son does provide much of the history.  There are still some swelling present in the left lower extremity.    Review of Systems  Cardiovascular:  Positive for leg swelling.  Skin:  Positive for wound.  Neurological:  Positive for weakness.  All other systems reviewed and are negative.      Objective:   Physical Exam Vitals reviewed.  HENT:     Head: Normocephalic.  Cardiovascular:     Rate and Rhythm: Normal rate.  Pulmonary:     Effort: Pulmonary effort is normal.  Musculoskeletal:     Right lower leg: Edema present.  Skin:    General: Skin is warm and dry.  Neurological:     Mental Status: She is alert and oriented to person, place, and time.     Motor: Weakness present.  Psychiatric:        Mood and Affect: Mood normal.        Behavior: Behavior normal.        Thought Content: Thought content normal.        Judgment: Judgment normal.     BP 135/81   Pulse 94   Ht '4\' 11"'$  (1.499 m)   Wt 114 lb (51.7 kg)   BMI 23.03 kg/m   Past Medical History:  Diagnosis Date   Arthritis    Arthritis    Breast cancer ([place]) 1992   right breast with lumpectomy and rad tx   Cancer of right breast ([place]) 04/16/2013   right breast with mastectomy   Diabetes mellitus without complication  ([place])    Gout    High cholesterol    Hyperlipidemia    Hypertension    Personal history of radiation therapy     Social History   Socioeconomic History   Marital status: Married    Spouse name: Not on file   Number of children: 2   Years of education: Not on file   Highest education level: Not on file  Occupational History   Not on file  Tobacco Use   Smoking status: Never    Passive exposure: Never   Smokeless tobacco: Never  Vaping Use   Vaping Use: Never used  Substance and Sexual Activity   Alcohol use: No    Alcohol/week: 0.0 standard drinks of alcohol   Drug use: No   Sexual activity: Not Currently  Other Topics Concern   Not on file  Social History Narrative   Not on file   Social Determinants of Health   Financial Resource Strain: Not on file  Food Insecurity: No Food Insecurity (11/02/2022)   Hunger Vital Sign    Worried About Running Out of Food in the Last Year: Never true    Ran Out of Food  in the Last Year: Never true  Transportation Needs: No Transportation Needs (11/02/2022)   PRAPARE - Hydrologist (Medical): No    Lack of Transportation (Non-Medical): No  Physical Activity: Not on file  Stress: Not on file  Social Connections: Not on file  Intimate Partner Violence: Not At Risk (11/02/2022)   Humiliation, Afraid, Rape, and Kick questionnaire    Fear of Current or Ex-Partner: No    Emotionally Abused: No    Physically Abused: No    Sexually Abused: No    Past Surgical History:  Procedure Laterality Date   ABDOMINAL HYSTERECTOMY     ANKLE FRACTURE SURGERY Right 2004   BREAST LUMPECTOMY Right 1992   positive   IRRIGATION AND DEBRIDEMENT FOOT Right 11/02/2022   Procedure: IRRIGATION AND DEBRIDEMENT RIGHT FOOT AND BONE BIOPSY RIGHT FOOT;  Surgeon: Samara Deist, DPM;  Location: [facility] ORS;  Service: Podiatry;  Laterality: Right;   IVC FILTER INSERTION N/A 11/06/2022   Procedure: IVC FILTER INSERTION;  Surgeon:  Algernon Huxley, MD;  Location: [place] [facility];  Service: Cardiovascular;  Laterality: N/A;   MASTECTOMY Right 2014   PERIPHERAL VASCULAR THROMBECTOMY Left 11/06/2022   Procedure: PERIPHERAL VASCULAR THROMBECTOMY- EXTERNAL Iliac Vein/CFV;  Surgeon: Algernon Huxley, MD;  Location: [place] [facility];  Service: Cardiovascular;  Laterality: Left;   SHOULDER SURGERY Right 2010    Family History  Problem Relation Age of Onset   Diabetes Father    Breast cancer Neg Hx     Allergies  Allergen Reactions   Other Anaphylaxis    Anesthisia has been a health issue for her in the past.    Sulfa Antibiotics Rash       Latest Ref Rng & Units 11/09/2022    5:19 AM 11/08/2022    1:32 PM 11/08/2022    4:56 AM  CBC  WBC 4.0 - 10.5 K/uL 10.5   11.5   Hemoglobin 12.0 - 15.0 g/dL 8.0  7.6  7.4   Hematocrit 36.0 - 46.0 % 26.4   24.6   Platelets 150 - 400 K/uL 328   320       CMP     Component Value Date/Time   NA 141 11/07/2022 0601   NA 142 10/01/2014 1119   K 3.8 11/07/2022 0601   K 3.9 10/01/2014 1119   CL 114 (H) 11/07/2022 0601   CL 104 10/01/2014 1119   CO2 23 11/07/2022 0601   CO2 28 10/01/2014 1119   GLUCOSE 123 (H) 11/07/2022 0601   GLUCOSE 101 (H) 10/01/2014 1119   BUN 16 11/07/2022 0601   BUN 41 (H) 09/08/2018 1543   BUN 18 10/01/2014 1119   CREATININE 0.79 11/07/2022 0601   CREATININE 1.20 10/01/2014 1119   CALCIUM 7.7 (L) 11/07/2022 0601   CALCIUM 8.9 10/01/2014 1119   PROT 6.6 11/02/2022 0501   PROT 6.8 10/01/2014 1119   ALBUMIN 2.6 (L) 11/02/2022 0501   ALBUMIN 3.4 10/01/2014 1119   AST 23 11/02/2022 0501   AST 16 10/01/2014 1119   ALT 7 11/02/2022 0501   ALT 17 10/01/2014 1119   ALKPHOS 56 11/02/2022 0501   ALKPHOS 36 (L) 10/01/2014 1119   BILITOT 0.7 11/02/2022 0501   BILITOT 0.4 10/01/2014 1119   GFRNONAA >60 11/07/2022 0601   GFRNONAA 47 (L) 10/01/2014 1119   GFRNONAA 57 (L) 03/26/2014 0949   GFRAA 54 (L) 12/22/2018 1036   GFRAA 57 (L) 10/01/2014  1119  GFRAA >60 03/26/2014 0949     No results found.     Assessment & Plan:   1. Acute deep vein thrombosis (DVT) of femoral vein of left lower extremity (HCC) The patient had risk of acute bleeding during her hospitalization.  Following thrombectomy she did have an IVC filter placed.  Not on anticoagulation currently.  Will have the patient follow-up in 6 to 8 weeks to evaluate progress of her DVT.  Given that the patient has ongoing bleeding risk and likely has more possible surgeries due to her wound we will also plan on keeping IVC filter in place for now.  2. Benign essential hypertension Continue antihypertensive medications as already ordered, these medications have been reviewed and there are no changes at this time.   Current Outpatient Medications on File Prior to Visit  Medication Sig Dispense Refill   acetaminophen (TYLENOL) 500 MG tablet Take 500-1,000 mg by mouth 4 (four) times daily as needed for mild pain or moderate pain.     allopurinol (ZYLOPRIM) 300 MG tablet Take 300 mg by mouth daily.  11   atenolol (TENORMIN) 50 MG tablet Take 50 mg by mouth daily.      atorvastatin (LIPITOR) 40 MG tablet Take 40 mg by mouth daily.      diphenoxylate-atropine (LOMOTIL) 2.5-0.025 MG tablet Take 1-2 tablets by mouth 3 (three) times daily as needed.     furosemide (LASIX) 40 MG tablet Take 40 mg by mouth daily.     gentamicin cream (GARAMYCIN) 0.1 % Apply 1 Application topically in the morning and at bedtime.     HYDROcodone-acetaminophen (NORCO) 7.5-325 MG tablet Take 1 tablet by mouth 2 (two) times daily as needed for moderate pain or severe pain.     levothyroxine (SYNTHROID) 88 MCG tablet Take 88 mcg by mouth daily.     lisinopril (ZESTRIL) 5 MG tablet Take 5 mg by mouth daily.     metFORMIN (GLUCOPHAGE) 1000 MG tablet Take 1,000 mg by mouth in the morning and at bedtime.     sitaGLIPtin (JANUVIA) 50 MG tablet Take 50 mg by mouth daily.     vitamin B-12 (CYANOCOBALAMIN) 1000  MCG tablet Take 1,000 mcg by mouth daily.     No current facility-administered medications on file prior to visit.    There are no Patient Instructions on file for this visit. No follow-ups on file.   Kris Hartmann, NP

## 2022-12-18 DIAGNOSIS — L89154 Pressure ulcer of sacral region, stage 4: Secondary | ICD-10-CM | POA: Diagnosis not present

## 2022-12-18 DIAGNOSIS — L89890 Pressure ulcer of other site, unstageable: Secondary | ICD-10-CM | POA: Diagnosis not present

## 2022-12-18 DIAGNOSIS — L89892 Pressure ulcer of other site, stage 2: Secondary | ICD-10-CM | POA: Diagnosis not present

## 2022-12-19 ENCOUNTER — Other Ambulatory Visit: Payer: Self-pay

## 2022-12-19 ENCOUNTER — Emergency Department: Payer: PPO

## 2022-12-19 ENCOUNTER — Inpatient Hospital Stay
Admission: EM | Admit: 2022-12-19 | Discharge: 2022-12-24 | DRG: 871 | Disposition: A | Discharge status: Home Health | Payer: PPO | Attending: Internal Medicine | Admitting: Internal Medicine

## 2022-12-19 DIAGNOSIS — L89620 Pressure ulcer of left heel, unstageable: Secondary | ICD-10-CM | POA: Diagnosis present

## 2022-12-19 DIAGNOSIS — I130 Hypertensive heart and chronic kidney disease with heart failure and stage 1 through stage 4 chronic kidney disease, or unspecified chronic kidney disease: Secondary | ICD-10-CM | POA: Diagnosis not present

## 2022-12-19 DIAGNOSIS — L8990 Pressure ulcer of unspecified site, unspecified stage: Secondary | ICD-10-CM | POA: Diagnosis present

## 2022-12-19 DIAGNOSIS — Z881 Allergy status to other antibiotic agents status: Secondary | ICD-10-CM

## 2022-12-19 DIAGNOSIS — E039 Hypothyroidism, unspecified: Secondary | ICD-10-CM | POA: Diagnosis not present

## 2022-12-19 DIAGNOSIS — L89514 Pressure ulcer of right ankle, stage 4: Secondary | ICD-10-CM | POA: Diagnosis present

## 2022-12-19 DIAGNOSIS — N390 Urinary tract infection, site not specified: Secondary | ICD-10-CM | POA: Diagnosis present

## 2022-12-19 DIAGNOSIS — R54 Age-related physical debility: Secondary | ICD-10-CM | POA: Diagnosis present

## 2022-12-19 DIAGNOSIS — R6520 Severe sepsis without septic shock: Secondary | ICD-10-CM | POA: Diagnosis not present

## 2022-12-19 DIAGNOSIS — Z79890 Hormone replacement therapy (postmenopausal): Secondary | ICD-10-CM

## 2022-12-19 DIAGNOSIS — D75838 Other thrombocytosis: Secondary | ICD-10-CM | POA: Diagnosis present

## 2022-12-19 DIAGNOSIS — M109 Gout, unspecified: Secondary | ICD-10-CM | POA: Diagnosis not present

## 2022-12-19 DIAGNOSIS — R1084 Generalized abdominal pain: Secondary | ICD-10-CM | POA: Diagnosis not present

## 2022-12-19 DIAGNOSIS — N3000 Acute cystitis without hematuria: Secondary | ICD-10-CM | POA: Diagnosis not present

## 2022-12-19 DIAGNOSIS — R6521 Severe sepsis with septic shock: Secondary | ICD-10-CM | POA: Diagnosis present

## 2022-12-19 DIAGNOSIS — R630 Anorexia: Secondary | ICD-10-CM | POA: Diagnosis present

## 2022-12-19 DIAGNOSIS — N1831 Chronic kidney disease, stage 3a: Secondary | ICD-10-CM | POA: Diagnosis not present

## 2022-12-19 DIAGNOSIS — B964 Proteus (mirabilis) (morganii) as the cause of diseases classified elsewhere: Secondary | ICD-10-CM | POA: Diagnosis present

## 2022-12-19 DIAGNOSIS — L97412 Non-pressure chronic ulcer of right heel and midfoot with fat layer exposed: Secondary | ICD-10-CM | POA: Diagnosis not present

## 2022-12-19 DIAGNOSIS — R531 Weakness: Secondary | ICD-10-CM | POA: Diagnosis not present

## 2022-12-19 DIAGNOSIS — E1122 Type 2 diabetes mellitus with diabetic chronic kidney disease: Secondary | ICD-10-CM | POA: Diagnosis present

## 2022-12-19 DIAGNOSIS — I214 Non-ST elevation (NSTEMI) myocardial infarction: Secondary | ICD-10-CM | POA: Diagnosis not present

## 2022-12-19 DIAGNOSIS — Z1152 Encounter for screening for COVID-19: Secondary | ICD-10-CM

## 2022-12-19 DIAGNOSIS — E7800 Pure hypercholesterolemia, unspecified: Secondary | ICD-10-CM | POA: Diagnosis present

## 2022-12-19 DIAGNOSIS — Z95828 Presence of other vascular implants and grafts: Secondary | ICD-10-CM

## 2022-12-19 DIAGNOSIS — I10 Essential (primary) hypertension: Secondary | ICD-10-CM | POA: Diagnosis present

## 2022-12-19 DIAGNOSIS — Z79899 Other long term (current) drug therapy: Secondary | ICD-10-CM

## 2022-12-19 DIAGNOSIS — R509 Fever, unspecified: Secondary | ICD-10-CM | POA: Diagnosis not present

## 2022-12-19 DIAGNOSIS — Z833 Family history of diabetes mellitus: Secondary | ICD-10-CM

## 2022-12-19 DIAGNOSIS — Z884 Allergy status to anesthetic agent status: Secondary | ICD-10-CM

## 2022-12-19 DIAGNOSIS — I5032 Chronic diastolic (congestive) heart failure: Secondary | ICD-10-CM | POA: Diagnosis not present

## 2022-12-19 DIAGNOSIS — A4181 Sepsis due to Enterococcus: Principal | ICD-10-CM | POA: Diagnosis present

## 2022-12-19 DIAGNOSIS — Z87892 Personal history of anaphylaxis: Secondary | ICD-10-CM

## 2022-12-19 DIAGNOSIS — R410 Disorientation, unspecified: Secondary | ICD-10-CM | POA: Diagnosis not present

## 2022-12-19 DIAGNOSIS — L97511 Non-pressure chronic ulcer of other part of right foot limited to breakdown of skin: Secondary | ICD-10-CM | POA: Diagnosis present

## 2022-12-19 DIAGNOSIS — R0689 Other abnormalities of breathing: Secondary | ICD-10-CM | POA: Diagnosis not present

## 2022-12-19 DIAGNOSIS — Z882 Allergy status to sulfonamides status: Secondary | ICD-10-CM

## 2022-12-19 DIAGNOSIS — D72829 Elevated white blood cell count, unspecified: Secondary | ICD-10-CM

## 2022-12-19 DIAGNOSIS — Z993 Dependence on wheelchair: Secondary | ICD-10-CM

## 2022-12-19 DIAGNOSIS — G9341 Metabolic encephalopathy: Secondary | ICD-10-CM | POA: Diagnosis present

## 2022-12-19 DIAGNOSIS — Z6822 Body mass index (BMI) 22.0-22.9, adult: Secondary | ICD-10-CM

## 2022-12-19 DIAGNOSIS — K5730 Diverticulosis of large intestine without perforation or abscess without bleeding: Secondary | ICD-10-CM | POA: Diagnosis present

## 2022-12-19 DIAGNOSIS — Z7984 Long term (current) use of oral hypoglycemic drugs: Secondary | ICD-10-CM

## 2022-12-19 DIAGNOSIS — E1129 Type 2 diabetes mellitus with other diabetic kidney complication: Secondary | ICD-10-CM | POA: Diagnosis present

## 2022-12-19 DIAGNOSIS — I700 Atherosclerosis of aorta: Secondary | ICD-10-CM | POA: Diagnosis present

## 2022-12-19 DIAGNOSIS — E1151 Type 2 diabetes mellitus with diabetic peripheral angiopathy without gangrene: Secondary | ICD-10-CM | POA: Diagnosis not present

## 2022-12-19 DIAGNOSIS — L89210 Pressure ulcer of right hip, unstageable: Secondary | ICD-10-CM | POA: Diagnosis present

## 2022-12-19 DIAGNOSIS — I82409 Acute embolism and thrombosis of unspecified deep veins of unspecified lower extremity: Secondary | ICD-10-CM | POA: Diagnosis present

## 2022-12-19 DIAGNOSIS — S91302A Unspecified open wound, left foot, initial encounter: Secondary | ICD-10-CM | POA: Diagnosis not present

## 2022-12-19 DIAGNOSIS — L97521 Non-pressure chronic ulcer of other part of left foot limited to breakdown of skin: Secondary | ICD-10-CM

## 2022-12-19 DIAGNOSIS — R001 Bradycardia, unspecified: Secondary | ICD-10-CM | POA: Diagnosis not present

## 2022-12-19 DIAGNOSIS — I5042 Chronic combined systolic (congestive) and diastolic (congestive) heart failure: Secondary | ICD-10-CM | POA: Diagnosis not present

## 2022-12-19 DIAGNOSIS — A419 Sepsis, unspecified organism: Secondary | ICD-10-CM | POA: Diagnosis present

## 2022-12-19 DIAGNOSIS — E11621 Type 2 diabetes mellitus with foot ulcer: Secondary | ICD-10-CM | POA: Diagnosis present

## 2022-12-19 DIAGNOSIS — D509 Iron deficiency anemia, unspecified: Secondary | ICD-10-CM | POA: Diagnosis present

## 2022-12-19 DIAGNOSIS — N182 Chronic kidney disease, stage 2 (mild): Secondary | ICD-10-CM | POA: Diagnosis present

## 2022-12-19 DIAGNOSIS — Z923 Personal history of irradiation: Secondary | ICD-10-CM

## 2022-12-19 DIAGNOSIS — L89614 Pressure ulcer of right heel, stage 4: Secondary | ICD-10-CM | POA: Diagnosis not present

## 2022-12-19 DIAGNOSIS — Z87442 Personal history of urinary calculi: Secondary | ICD-10-CM

## 2022-12-19 DIAGNOSIS — R4182 Altered mental status, unspecified: Secondary | ICD-10-CM | POA: Diagnosis not present

## 2022-12-19 DIAGNOSIS — R0902 Hypoxemia: Secondary | ICD-10-CM | POA: Diagnosis not present

## 2022-12-19 DIAGNOSIS — E785 Hyperlipidemia, unspecified: Secondary | ICD-10-CM | POA: Diagnosis present

## 2022-12-19 DIAGNOSIS — L89154 Pressure ulcer of sacral region, stage 4: Secondary | ICD-10-CM | POA: Diagnosis not present

## 2022-12-19 DIAGNOSIS — R7881 Bacteremia: Secondary | ICD-10-CM | POA: Diagnosis not present

## 2022-12-19 DIAGNOSIS — Z743 Need for continuous supervision: Secondary | ICD-10-CM | POA: Diagnosis not present

## 2022-12-19 DIAGNOSIS — I739 Peripheral vascular disease, unspecified: Secondary | ICD-10-CM | POA: Diagnosis not present

## 2022-12-19 DIAGNOSIS — B952 Enterococcus as the cause of diseases classified elsewhere: Secondary | ICD-10-CM | POA: Diagnosis not present

## 2022-12-19 DIAGNOSIS — M7731 Calcaneal spur, right foot: Secondary | ICD-10-CM | POA: Diagnosis not present

## 2022-12-19 DIAGNOSIS — L97522 Non-pressure chronic ulcer of other part of left foot with fat layer exposed: Secondary | ICD-10-CM

## 2022-12-19 DIAGNOSIS — Z86718 Personal history of other venous thrombosis and embolism: Secondary | ICD-10-CM

## 2022-12-19 DIAGNOSIS — N3289 Other specified disorders of bladder: Secondary | ICD-10-CM | POA: Diagnosis not present

## 2022-12-19 DIAGNOSIS — N993 Prolapse of vaginal vault after hysterectomy: Secondary | ICD-10-CM | POA: Diagnosis present

## 2022-12-19 DIAGNOSIS — Z9011 Acquired absence of right breast and nipple: Secondary | ICD-10-CM

## 2022-12-19 DIAGNOSIS — R000 Tachycardia, unspecified: Secondary | ICD-10-CM | POA: Diagnosis not present

## 2022-12-19 DIAGNOSIS — S91301A Unspecified open wound, right foot, initial encounter: Secondary | ICD-10-CM | POA: Diagnosis not present

## 2022-12-19 DIAGNOSIS — D631 Anemia in chronic kidney disease: Secondary | ICD-10-CM | POA: Diagnosis present

## 2022-12-19 DIAGNOSIS — R339 Retention of urine, unspecified: Secondary | ICD-10-CM | POA: Diagnosis not present

## 2022-12-19 DIAGNOSIS — K5900 Constipation, unspecified: Secondary | ICD-10-CM | POA: Diagnosis not present

## 2022-12-19 DIAGNOSIS — Z853 Personal history of malignant neoplasm of breast: Secondary | ICD-10-CM

## 2022-12-19 LAB — CBG MONITORING, ED
Glucose-Capillary: 193 mg/dL — ABNORMAL HIGH (ref 70–99)
Glucose-Capillary: 126 mg/dL — ABNORMAL HIGH (ref 70–99)
Glucose-Capillary: 153 mg/dL — ABNORMAL HIGH (ref 70–99)
Glucose-Capillary: 136 mg/dL — ABNORMAL HIGH (ref 70–99)

## 2022-12-19 LAB — URINALYSIS, W/ REFLEX TO CULTURE (INFECTION SUSPECTED)
Bilirubin Urine: NEGATIVE
Glucose, UA: NEGATIVE mg/dL
Ketones, ur: NEGATIVE mg/dL
Nitrite: NEGATIVE
Protein, ur: 100 mg/dL — AB
Specific Gravity, Urine: 1.017 (ref 1.005–1.030)
WBC, UA: >50 WBC/hpf (ref 0–5)
pH: 6 (ref 5.0–8.0)

## 2022-12-19 LAB — TROPONIN I (HIGH SENSITIVITY)
Troponin I (High Sensitivity): 2280 ng/L (ref <18)
Troponin I (High Sensitivity): 2583 ng/L (ref <18)
Troponin I (High Sensitivity): 2592 ng/L (ref <18)
Troponin I (High Sensitivity): 1991 ng/L (ref <18)
Troponin I (High Sensitivity): 1515 ng/L (ref <18)

## 2022-12-19 LAB — BRAIN NATRIURETIC PEPTIDE: B Natriuretic Peptide: 719.3 pg/mL — ABNORMAL HIGH (ref 0.0–100.0)

## 2022-12-19 LAB — RESP PANEL BY RT-PCR (RSV, FLU A&B, COVID)  RVPGX2
Influenza A by PCR: NEGATIVE
Influenza B by PCR: NEGATIVE
Resp Syncytial Virus by PCR: NEGATIVE
SARS Coronavirus 2 by RT PCR: NEGATIVE

## 2022-12-19 LAB — COMPREHENSIVE METABOLIC PANEL
ALT: 9 U/L (ref 0–44)
AST: 27 U/L (ref 15–41)
Albumin: 2.8 g/dL — ABNORMAL LOW (ref 3.5–5.0)
Alkaline Phosphatase: 64 U/L (ref 38–126)
Anion gap: 10 (ref 5–15)
BUN: 20 mg/dL (ref 8–23)
CO2: 18 mmol/L — ABNORMAL LOW (ref 22–32)
Calcium: 8.1 mg/dL — ABNORMAL LOW (ref 8.9–10.3)
Chloride: 108 mmol/L (ref 98–111)
Creatinine, Ser: 0.98 mg/dL (ref 0.44–1.00)
GFR, Estimated: 59 mL/min — ABNORMAL LOW (ref >60)
Glucose, Bld: 192 mg/dL — ABNORMAL HIGH (ref 70–99)
Potassium: 3.8 mmol/L (ref 3.5–5.1)
Sodium: 136 mmol/L (ref 135–145)
Total Bilirubin: 0.7 mg/dL (ref 0.3–1.2)
Total Protein: 7.6 g/dL (ref 6.5–8.1)

## 2022-12-19 LAB — CBC WITH DIFFERENTIAL/PLATELET
Abs Immature Granulocytes: 0.2 10*3/uL — ABNORMAL HIGH (ref 0.00–0.07)
Basophils Absolute: 0.1 10*3/uL (ref 0.0–0.1)
Basophils Relative: 0 %
Eosinophils Absolute: 0 10*3/uL (ref 0.0–0.5)
Eosinophils Relative: 0 %
HCT: 29.8 % — ABNORMAL LOW (ref 36.0–46.0)
Hemoglobin: 8.9 g/dL — ABNORMAL LOW (ref 12.0–15.0)
Immature Granulocytes: 1 %
Lymphocytes Relative: 10 %
Lymphs Abs: 2.2 10*3/uL (ref 0.7–4.0)
MCH: 23.7 pg — ABNORMAL LOW (ref 26.0–34.0)
MCHC: 29.9 g/dL — ABNORMAL LOW (ref 30.0–36.0)
MCV: 79.3 fL — ABNORMAL LOW (ref 80.0–100.0)
Monocytes Absolute: 0.6 10*3/uL (ref 0.1–1.0)
Monocytes Relative: 3 %
Neutro Abs: 18.6 10*3/uL — ABNORMAL HIGH (ref 1.7–7.7)
Neutrophils Relative %: 86 %
Platelets: 517 10*3/uL — ABNORMAL HIGH (ref 150–400)
RBC: 3.76 MIL/uL — ABNORMAL LOW (ref 3.87–5.11)
RDW: 17.9 % — ABNORMAL HIGH (ref 11.5–15.5)
WBC: 21.7 10*3/uL — ABNORMAL HIGH (ref 4.0–10.5)
nRBC: 0 % (ref 0.0–0.2)

## 2022-12-19 LAB — HEMOGLOBIN A1C
Hgb A1c MFr Bld: 6.1 % — ABNORMAL HIGH (ref 4.8–5.6)
Mean Plasma Glucose: 128.37 mg/dL

## 2022-12-19 LAB — LIPASE, BLOOD: Lipase: 35 U/L (ref 11–51)

## 2022-12-19 LAB — LACTIC ACID, PLASMA
Lactic Acid, Venous: 2.8 mmol/L (ref 0.5–1.9)
Lactic Acid, Venous: 2.1 mmol/L (ref 0.5–1.9)

## 2022-12-19 LAB — HEPARIN LEVEL (UNFRACTIONATED): Heparin Unfractionated: <0.1 IU/mL — ABNORMAL LOW (ref 0.30–0.70)

## 2022-12-19 LAB — PROTIME-INR
INR: 1.5 — ABNORMAL HIGH (ref 0.8–1.2)
Prothrombin Time: 17.6 seconds — ABNORMAL HIGH (ref 11.4–15.2)

## 2022-12-19 LAB — PROCALCITONIN: Procalcitonin: 0.47 ng/mL

## 2022-12-19 LAB — APTT: aPTT: 113 seconds — ABNORMAL HIGH (ref 24–36)

## 2022-12-19 MED ORDER — SODIUM CHLORIDE 0.9% FLUSH
10.0000 mL | Freq: Two times a day (BID) | INTRAVENOUS | Status: DC
  Administered 2022-12-20 – 2022-12-24 (×8): 10 mL

## 2022-12-19 MED ORDER — ACETAMINOPHEN 650 MG RE SUPP: 650.0000 mg | Freq: Four times a day (QID) | RECTAL | Status: DC | PRN

## 2022-12-19 MED ORDER — ACETAMINOPHEN 325 MG RE SUPP
RECTAL | Status: AC
  Administered 2022-12-19: 975 mg via RECTAL
  Filled 2022-12-19: qty 2

## 2022-12-19 MED ORDER — LACTATED RINGERS IV BOLUS (SEPSIS)
1000.0000 mL | Freq: Once | INTRAVENOUS | Status: AC
  Administered 2022-12-19: 1000 mL via INTRAVENOUS

## 2022-12-19 MED ORDER — HEPARIN SODIUM (PORCINE) 5000 UNIT/ML IJ SOLN: 60.0000 [IU]/kg | Freq: Once | INTRAMUSCULAR | Status: DC

## 2022-12-19 MED ORDER — ATORVASTATIN CALCIUM 20 MG PO TABS
40.0000 mg | ORAL_TABLET | Freq: Every day | ORAL | Status: DC
  Administered 2022-12-20 – 2022-12-24 (×5): 40 mg via ORAL
  Filled 2022-12-19 (×6): qty 2

## 2022-12-19 MED ORDER — HEPARIN (PORCINE) 25000 UT/250ML-% IV SOLN
1200.0000 [IU]/h | INTRAVENOUS | Status: AC
  Administered 2022-12-19: 650 [IU]/h via INTRAVENOUS
  Administered 2022-12-21: 1200 [IU]/h via INTRAVENOUS
  Filled 2022-12-19 (×3): qty 250

## 2022-12-19 MED ORDER — HYDRALAZINE HCL 20 MG/ML IJ SOLN: 5.0000 mg | INTRAMUSCULAR | Status: DC | PRN

## 2022-12-19 MED ORDER — SODIUM CHLORIDE 0.9 % IV BOLUS
500.0000 mL | Freq: Once | INTRAVENOUS | Status: AC
  Administered 2022-12-19: 500 mL via INTRAVENOUS

## 2022-12-19 MED ORDER — ASPIRIN 81 MG PO TBEC
81.0000 mg | DELAYED_RELEASE_TABLET | Freq: Every day | ORAL | Status: DC
  Administered 2022-12-20 – 2022-12-24 (×5): 81 mg via ORAL
  Filled 2022-12-19 (×5): qty 1

## 2022-12-19 MED ORDER — SODIUM CHLORIDE 0.9 % IV SOLN
1.0000 g | INTRAVENOUS | Status: DC
  Administered 2022-12-19 – 2022-12-21 (×3): 1 g via INTRAVENOUS
  Filled 2022-12-19 (×3): qty 10

## 2022-12-19 MED ORDER — HEPARIN BOLUS VIA INFUSION
1500.0000 [IU] | Freq: Once | INTRAVENOUS | Status: AC
  Administered 2022-12-19: 1500 [IU] via INTRAVENOUS
  Filled 2022-12-19: qty 1500

## 2022-12-19 MED ORDER — ACETAMINOPHEN 325 MG RE SUPP: 975.0000 mg | Freq: Once | RECTAL | Status: AC

## 2022-12-19 MED ORDER — HYDROCORTISONE SOD SUC (PF) 100 MG IJ SOLR
100.0000 mg | Freq: Once | INTRAMUSCULAR | Status: AC
  Administered 2022-12-19: 100 mg via INTRAVENOUS
  Filled 2022-12-19: qty 2

## 2022-12-19 MED ORDER — INSULIN ASPART 100 UNIT/ML IJ SOLN
0.0000 [IU] | Freq: Three times a day (TID) | INTRAMUSCULAR | Status: DC
  Administered 2022-12-19: 2 [IU] via SUBCUTANEOUS
  Administered 2022-12-19: 1 [IU] via SUBCUTANEOUS
  Administered 2022-12-19 – 2022-12-22 (×2): 2 [IU] via SUBCUTANEOUS
  Administered 2022-12-22 – 2022-12-23 (×2): 1 [IU] via SUBCUTANEOUS
  Administered 2022-12-23: 2 [IU] via SUBCUTANEOUS
  Administered 2022-12-23: 1 [IU] via SUBCUTANEOUS
  Administered 2022-12-24: 2 [IU] via SUBCUTANEOUS
  Filled 2022-12-19 (×10): qty 1

## 2022-12-19 MED ORDER — ONDANSETRON HCL 4 MG/2ML IJ SOLN: 4.0000 mg | Freq: Three times a day (TID) | INTRAMUSCULAR | Status: DC | PRN

## 2022-12-19 MED ORDER — SODIUM CHLORIDE 0.9 % IV BOLUS
1000.0000 mL | Freq: Once | INTRAVENOUS | Status: AC
  Administered 2022-12-19: 1000 mL via INTRAVENOUS

## 2022-12-19 MED ORDER — ALBUMIN HUMAN 25 % IV SOLN
12.5000 g | Freq: Once | INTRAVENOUS | Status: AC
  Administered 2022-12-19: 12.5 g via INTRAVENOUS
  Filled 2022-12-19: qty 50

## 2022-12-19 MED ORDER — INSULIN ASPART 100 UNIT/ML IJ SOLN: 0.0000 [IU] | Freq: Every day | INTRAMUSCULAR | Status: DC

## 2022-12-19 MED ORDER — SODIUM CHLORIDE 0.9% FLUSH: 10.0000 mL | INTRAVENOUS | Status: DC | PRN

## 2022-12-19 MED ORDER — MEDIHONEY WOUND/BURN DRESSING EX PSTE
1.0000 | PASTE | Freq: Every day | CUTANEOUS | Status: DC
  Administered 2022-12-21 – 2022-12-24 (×4): 1 via TOPICAL
  Filled 2022-12-19 (×2): qty 44

## 2022-12-19 MED ORDER — ASPIRIN 81 MG PO CHEW
324.0000 mg | CHEWABLE_TABLET | Freq: Once | ORAL | Status: AC
  Administered 2022-12-19: 324 mg via ORAL
  Filled 2022-12-19: qty 4

## 2022-12-19 MED ORDER — VANCOMYCIN HCL 750 MG/150ML IV SOLN: 750.0000 mg | INTRAVENOUS | Status: DC

## 2022-12-19 MED ORDER — IOHEXOL 300 MG/ML  SOLN
75.0000 mL | Freq: Once | INTRAMUSCULAR | Status: AC | PRN
  Administered 2022-12-19: 75 mL via INTRAVENOUS

## 2022-12-19 MED ORDER — LACTATED RINGERS IV BOLUS (SEPSIS)
500.0000 mL | Freq: Once | INTRAVENOUS | Status: AC
  Administered 2022-12-19: 500 mL via INTRAVENOUS

## 2022-12-19 MED ORDER — METRONIDAZOLE 500 MG/100ML IV SOLN
500.0000 mg | Freq: Once | INTRAVENOUS | Status: AC
  Administered 2022-12-19: 500 mg via INTRAVENOUS
  Filled 2022-12-19: qty 100

## 2022-12-19 MED ORDER — SODIUM CHLORIDE 0.9 % IV SOLN
2.0000 g | Freq: Once | INTRAVENOUS | Status: AC
  Administered 2022-12-19: 2 g via INTRAVENOUS
  Filled 2022-12-19: qty 12.5

## 2022-12-19 MED ORDER — HEPARIN (PORCINE) 25000 UT/250ML-% IV SOLN: 14.0000 [IU]/kg/h | INTRAVENOUS | Status: DC

## 2022-12-19 MED ORDER — MIDODRINE HCL 5 MG PO TABS
10.0000 mg | ORAL_TABLET | Freq: Three times a day (TID) | ORAL | Status: DC
  Administered 2022-12-19 – 2022-12-21 (×5): 10 mg via ORAL
  Filled 2022-12-19 (×5): qty 2

## 2022-12-19 MED ORDER — HEPARIN BOLUS VIA INFUSION
3000.0000 [IU] | Freq: Once | INTRAVENOUS | Status: AC
  Administered 2022-12-19: 3000 [IU] via INTRAVENOUS
  Filled 2022-12-19: qty 3000

## 2022-12-19 MED ORDER — VANCOMYCIN HCL IN DEXTROSE 1-5 GM/200ML-% IV SOLN
1000.0000 mg | Freq: Once | INTRAVENOUS | Status: AC
  Administered 2022-12-19: 1000 mg via INTRAVENOUS
  Filled 2022-12-19: qty 200

## 2022-12-19 MED ORDER — SODIUM CHLORIDE 0.9 % IV SOLN
INTRAVENOUS | Status: DC
  Administered 2022-12-19: 999 mL via INTRAVENOUS

## 2022-12-19 MED ORDER — ACETAMINOPHEN 325 MG PO TABS: 650.0000 mg | ORAL_TABLET | Freq: Four times a day (QID) | ORAL | Status: DC | PRN

## 2022-12-19 MED ORDER — DIPHENOXYLATE-ATROPINE 2.5-0.025 MG PO TABS: 1.0000 | ORAL_TABLET | Freq: Three times a day (TID) | ORAL | Status: DC | PRN

## 2022-12-19 MED ORDER — ACETAMINOPHEN 650 MG RE SUPP: 650.0000 mg | Freq: Once | RECTAL | Status: DC

## 2022-12-19 MED ORDER — LEVOTHYROXINE SODIUM 88 MCG PO TABS
88.0000 ug | ORAL_TABLET | Freq: Every day | ORAL | Status: DC
  Administered 2022-12-20 – 2022-12-24 (×5): 88 ug via ORAL
  Filled 2022-12-19 (×6): qty 1

## 2022-12-19 MED ORDER — HYDROCODONE-ACETAMINOPHEN 7.5-325 MG PO TABS
1.0000 | ORAL_TABLET | Freq: Two times a day (BID) | ORAL | Status: DC | PRN
  Administered 2022-12-19 – 2022-12-24 (×7): 1 via ORAL
  Filled 2022-12-19 (×7): qty 1

## 2022-12-19 NOTE — ED Notes (Signed)
RN messaged pharmacy requesting they review and verify heparin orders

## 2022-12-19 NOTE — Sepsis Progress Note (Signed)
Elink monitoring for the code sepsis protocol.  

## 2022-12-19 NOTE — Progress Notes (Addendum)
ANTICOAGULATION CONSULT NOTE  Pharmacy Consult for IV Heparin Indication: ACS/STEMI  Patient Measurements: Height: '4\' 11"'$  (149.9 cm) Weight: 49.7 kg (109 lb 9.6 oz) IBW/kg (Calculated) : 43.2 Heparin Dosing Weight: 49.7 kg  Labs: Recent Labs    12/19/22 0353 12/19/22 0534 12/19/22 0756 12/19/22 1523  HGB 8.9*  --   --   --   HCT 29.8*  --   --   --   PLT 517*  --   --   --   APTT  --   --  113*  --   LABPROT  --   --  17.6*  --   INR  --   --  1.5*  --   HEPARINUNFRC  --   --   --  <0.10*  CREATININE 0.98  --   --   --   TROPONINIHS 2,280* 2,583* 2,592* 1,991*    Estimated Creatinine Clearance: 31.7 mL/min (by C-G formula based on SCr of 0.98 mg/dL).  Medical History: Past Medical History:  Diagnosis Date   Arthritis    Arthritis    Breast cancer ([place]) 1992   right breast with lumpectomy and rad tx   Cancer of right breast ([place]) 04/16/2013   right breast with mastectomy   Diabetes mellitus without complication (HCC)    Gout    High cholesterol    Hyperlipidemia    Hypertension    Personal history of radiation therapy    Assessment: Pt is a 82 yo female presenting to ED due to fever, confusion, and abdominal pain, found with elevated troponin I level, trending up. Pharmacy consulted to initiate and manage heparin infusion for suspected ACS.  0207 1523 HL < 0.10, subthera; 650 un/hr  Goal of Therapy:  Heparin level 0.3-0.7 units/ml Monitor platelets by anticoagulation protocol: Yes   Plan:  --Heparin level is subtherapeutic --Heparin 1500 unit IV bolus and increase heparin infusion rate to 800 units/hr --Re-check HL 8 hours from rate change --Daily CBC per protocol while on IV heparin  Benita Gutter  12/19/2022 4:08 PM

## 2022-12-19 NOTE — Consult Note (Signed)
Pharmacy Antibiotic Note  [name] is a 82 y.o. female with PMH including CHF, HTN, HLD, PVD, DVT s/p IVC filter, CKD, kidney stones, diverticulitis, anemia, gout, hypothyroidism admitted on 12/19/2022 with sepsis / elevated troponin. Source of infection thought to be urine versus bilateral heel wounds. Pharmacy has been consulted for vancomycin dosing.Patient is also ordered ceftriaxone.  Plan:  Vancomycin 1 g IV LD followed by maintenance regimen of vancomycin 750 mg IV q36h --Calculated AUC: 454, Cmin: 10.7 --Daily Scr per protocol --Levels at steady state or as clinically indicated  Height: '4\' 11"'$  (149.9 cm) Weight: 49.7 kg (109 lb 9.6 oz) IBW/kg (Calculated) : 43.2  Temp (24hrs), Avg:100.7 F (38.2 C), Min:99 F (37.2 C), Max:102.3 F (39.1 C)  Recent Labs  Lab 12/19/22 0353 12/19/22 0534  WBC 21.7*  --   CREATININE 0.98  --   LATICACIDVEN 2.8* 2.1*    Estimated Creatinine Clearance: 31.7 mL/min (by C-G formula based on SCr of 0.98 mg/dL).    Allergies  Allergen Reactions   Other Anaphylaxis    Anesthisia has been a health issue for her in the past.    Sulfa Antibiotics Rash    Antimicrobials this admission: Cefepime 2/7 x 1 Metronidazole 2/7 x 1 Vancomycin 2/7 >>  Ceftriaxone 2/7 >>   Dose adjustments this admission: N/A  Microbiology results: 2/7 BCx: pending 2/7 UCx: pending  2/7 WoundCx: pending  Thank you for allowing pharmacy to be a part of this patient's care.  Benita Gutter 12/19/2022 11:15 AM

## 2022-12-19 NOTE — ED Notes (Addendum)
Wounds to pt feet cleansed and clean bandages applied. Pt resting in bed free from sign of distress with husband at bedside. Bed low and locked with side rails raised x2. Call bell in reach, monitor in place, and bed alarm in use.

## 2022-12-19 NOTE — Consult Note (Signed)
NAME:  [name], MRN:  [id number], DOB:  11-27-1940, LOS: 0 ADMISSION DATE:  12/19/2022, CONSULTATION DATE: 12/19/2022 REFERRING MD: Dr. Blaine Hamper, CHIEF COMPLAINT: Fevers   History of Present Illness:  This is a 82 yo female who presented to [facility] ER on 02/7 via EMS from home with fevers and altered mental status.  Pt is wheelchair bound at baseline.  EMS reports when they arrived at pts home she had an axillary temp of 99 degrees F.  Pt also reported abdominal pain throughout.    ED Course  Upon arrival to the ER pt met sepsis criteria and received cefepime, metronidazole, vancomycin, and iv fluid resuscitation.  Pts troponin elevated at 2,280 heparin gtt initiated.  CT Head negative.  CT Abd Pelvis negative for intraabdominal abnormality.  Pt also intermittently hypotensive.  Pt subsequently admitted to the stepdown unit per hospitalist team for additional workup and treatment.  PCCM team consulted for possible need of vasopressor therapy.  CT Head: Marland Kitchen No acute intracranial abnormality. Mild to moderate heterogeneity in the cerebral white matter an left basal for age ganglia compatible with small vessel disease  CT Abd Pelvis: No definite acute findings noted in the abdomen or pelvis to account for the patient's symptoms. Trace bilateral pleural effusions lying dependently. Colonic diverticulosis without evidence of acute diverticulitis at this time. Aortic atherosclerosis. Pelvic floor laxity with cystocele. Additional incidental findings, as above.  CXR: No acute cardiopulmonary abnormality.   Right Foot Complete x-ray: Soft tissue deficiency overlying the dorsal calcaneus but no radiographic evidence of osteomyelitis. Bulky chronic degenerative and/or post arthrodesis changes at the right ankle, with retained screw fragment.  Left Foot Complete x-ray: Dorsal soft tissue wound overlying the calcaneus with no radiographic evidence of Osteomyelitis. No acute osseous abnormality  identified.  Pertinent  Medical History  Arthritis  Breast Cancer s/p right mastectomy and radiation therapy  Type II Diabetes Mellitus  Gout  Hypercholesteremia  HLD HTN   Significant Hospital Events: Including procedures, antibiotic start and stop dates in addition to other pertinent events   02/7: Pt admitted with sepsis secondary to UTI and elevated troponin's, and acute encephalopathy.  PCCM team contacted for possible need of vasopressors   Interim History / Subjective:  Pt resting in bed in no distress.  Currently not requiring vasopressors bp 92/66 with map of 75.    Objective   Blood pressure 91/63, pulse 79, temperature 98.9 F (37.2 C), temperature source Axillary, resp. rate 17, height '4\' 11"'$  (1.499 m), weight 49.7 kg, SpO2 97 %.        Intake/Output Summary (Last 24 hours) at 12/19/2022 1408 Last data filed at 12/19/2022 7353 Gross per 24 hour  Intake 2700 ml  Output --  Net 2700 ml   Filed Weights   12/19/22 0338  Weight: 49.7 kg    Examination: General: Acute on chronically-ill appearing female, NAD resting in bed on RA HENT: Supple, no JVD  Lungs: Clear throughout, even, non labored  Cardiovascular: NSR, s1s2, no m/r/g, 2+ radial/1+ distal pulses, trace bilateral lower extremity edema  Abdomen: +BS x4, soft, non tender, non distended  Extremities: Generalized weakness Neuro: Alert, confused to situation, following commands, PERRLA  Skin: Bilateral heel pressure injuries dressings intact  GU: Deferred   Resolved Hospital Problem list     Assessment & Plan:  Septic shock secondary to UTI  Hx: HTN, hyperlipidemia, and hypercholesteremia  - Continuous telemetry monitoring  - Gentle iv fluid resuscitation, midodrine, and prn levophed gtt to maintain  map>65 - Hold antihypertensives, beta-blockers, and diuretics due to hypotension - Continue outpatient aspirin and atorvastatin  Elevated troponin's secondary to demand ischemia vs. NSTEMI - Trend  troponin's until peaked - Continue heparin gtt for now: dosing per pharmacy - Echo pending  - Cardiology consulted appreciate input   UTI Possible bilateral heel wound infection  - Trend WBC and monitor fever curve  - Follow cultures  - Continue ceftriaxone and vancomycin pending culture results and sensitivities  - Wound care consulted appreciate input   Acute metabolic encephalopathy  - Treat metabolic derangements  - Provide supportive care   Best Practice (right click and "Reselect all SmartList Selections" daily)   Diet/type: NPO DVT prophylaxis: systemic heparin GI prophylaxis: N/A Lines: N/A Foley:  N/A Code Status:  full code Last date of multidisciplinary goals of care discussion [N/A]  Labs   CBC: Recent Labs  Lab 12/19/22 0353  WBC 21.7*  NEUTROABS 18.6*  HGB 8.9*  HCT 29.8*  MCV 79.3*  PLT 517*    Basic Metabolic Panel: Recent Labs  Lab 12/19/22 0353  NA 136  K 3.8  CL 108  CO2 18*  GLUCOSE 192*  BUN 20  CREATININE 0.98  CALCIUM 8.1*   GFR: Estimated Creatinine Clearance: 31.7 mL/min (by C-G formula based on SCr of 0.98 mg/dL). Recent Labs  Lab 12/19/22 0353 12/19/22 0534  PROCALCITON 0.47  --   WBC 21.7*  --   LATICACIDVEN 2.8* 2.1*    Liver Function Tests: Recent Labs  Lab 12/19/22 0353  AST 27  ALT 9  ALKPHOS 64  BILITOT 0.7  PROT 7.6  ALBUMIN 2.8*   Recent Labs  Lab 12/19/22 0353  LIPASE 35   No results for input(s): "AMMONIA" in the last 168 hours.  ABG No results found for: "PHART", "PCO2ART", "PO2ART", "HCO3", "TCO2", "ACIDBASEDEF", "O2SAT"   Coagulation Profile: Recent Labs  Lab 12/19/22 0756  INR 1.5*    Cardiac Enzymes: No results for input(s): "CKTOTAL", "CKMB", "CKMBINDEX", "TROPONINI" in the last 168 hours.  HbA1C: Hgb A1c MFr Bld  Date/Time Value Ref Range Status  08/16/2022 07:32 PM 5.7 (H) 4.8 - 5.6 % Final    Comment:    (NOTE) Pre diabetes:          5.7%-6.4%  Diabetes:               >6.4%  Glycemic control for   <7.0% adults with diabetes     CBG: Recent Labs  Lab 12/19/22 0805 12/19/22 1118  GLUCAP 193* 126*    Review of Systems: Positives in BOLD   Gen: fever, chills, weight change, fatigue, night sweats HEENT: Denies blurred vision, double vision, hearing loss, tinnitus, sinus congestion, rhinorrhea, sore throat, neck stiffness, dysphagia PULM: Denies shortness of breath, cough, sputum production, hemoptysis, wheezing CV: Denies chest pain, edema, orthopnea, paroxysmal nocturnal dyspnea, palpitations GI: Denies abdominal pain, nausea, vomiting, diarrhea, hematochezia, melena, constipation, change in bowel habits GU: Denies dysuria, hematuria, polyuria, oliguria, urethral discharge Endocrine: Denies hot or cold intolerance, polyuria, polyphagia or appetite change Derm: Denies rash, dry skin, scaling or peeling skin change Heme: Denies easy bruising, bleeding, bleeding gums Neuro: confusion, headache, numbness, weakness, slurred speech, loss of memory or consciousness  Past Medical History:  She,  has a past medical history of Arthritis, Arthritis, Breast cancer ([place]) (1992), Cancer of right breast ([place]) (04/16/2013), Diabetes mellitus without complication ([place]), Gout, High cholesterol, Hyperlipidemia, Hypertension, and Personal history of radiation therapy.   Surgical History:   Past  Surgical History:  Procedure Laterality Date   ABDOMINAL HYSTERECTOMY     ANKLE FRACTURE SURGERY Right 2004   BREAST LUMPECTOMY Right 1992   positive   IRRIGATION AND DEBRIDEMENT FOOT Right 11/02/2022   Procedure: IRRIGATION AND DEBRIDEMENT RIGHT FOOT AND BONE BIOPSY RIGHT FOOT;  Surgeon: Samara Deist, DPM;  Location: [facility] ORS;  Service: Podiatry;  Laterality: Right;   IVC FILTER INSERTION N/A 11/06/2022   Procedure: IVC FILTER INSERTION;  Surgeon: Algernon Huxley, MD;  Location: [place] [facility];  Service: Cardiovascular;  Laterality: N/A;   MASTECTOMY Right 2014    PERIPHERAL VASCULAR THROMBECTOMY Left 11/06/2022   Procedure: PERIPHERAL VASCULAR THROMBECTOMY- EXTERNAL Iliac Vein/CFV;  Surgeon: Algernon Huxley, MD;  Location: [place] [facility];  Service: Cardiovascular;  Laterality: Left;   SHOULDER SURGERY Right 2010     Social History:   reports that she has never smoked. She has never been exposed to tobacco smoke. She has never used smokeless tobacco. She reports that she does not drink alcohol and does not use drugs.   Family History:  Her family history includes Diabetes in her father. There is no history of Breast cancer.   Allergies Allergies  Allergen Reactions   Other Anaphylaxis    Anesthisia has been a health issue for her in the past.    Sulfa Antibiotics Rash     Home Medications  Prior to Admission medications   Medication Sig Start Date End Date Taking? Authorizing Provider  acetaminophen (TYLENOL) 500 MG tablet Take 500-1,000 mg by mouth 4 (four) times daily as needed for mild pain or moderate pain.   Yes [provider]  allopurinol (ZYLOPRIM) 300 MG tablet Take 300 mg by mouth daily.   Yes [provider]  atenolol (TENORMIN) 50 MG tablet Take 50 mg by mouth daily.   Yes [provider]  atorvastatin (LIPITOR) 40 MG tablet Take 40 mg by mouth daily.    Yes [provider]  diphenoxylate-atropine (LOMOTIL) 2.5-0.025 MG tablet Take 1-2 tablets by mouth 3 (three) times daily as needed. 08/20/22  Yes [provider]  furosemide (LASIX) 40 MG tablet Take 40 mg by mouth daily.   Yes [provider]  gentamicin cream (GARAMYCIN) 0.1 % Apply 1 Application topically in the morning and at bedtime.   Yes [provider]  HYDROcodone-acetaminophen (NORCO) 7.5-325 MG tablet Take 1 tablet by mouth 2 (two) times daily as needed for moderate pain or severe pain.   Yes [provider]  levothyroxine (SYNTHROID) 88 MCG tablet Take 88 mcg by mouth daily.   Yes [provider]  lisinopril (ZESTRIL) 5 MG tablet Take 5 mg by mouth daily. 09/24/22 09/24/23 Yes [provider]  metFORMIN (GLUCOPHAGE) 1000 MG tablet Take 1,000 mg by mouth in the morning and at bedtime.   Yes [provider]  sitaGLIPtin (JANUVIA) 50 MG tablet Take 50 mg by mouth daily.   Yes [provider]  vitamin B-12 (CYANOCOBALAMIN) 1000 MCG tablet Take 1,000 mcg by mouth daily.   Yes [provider]     Critical care time: 50 minutes     If pt remains off vasopressors PCCM team will sign off  Donell Beers, [place] Pager [phone] (please enter 7 digits) PCCM Consult Pager [phone] (please enter 7 digits)

## 2022-12-19 NOTE — Consult Note (Addendum)
WOC Nurse Consult Note: Reason for Consult: bilateral heel wounds  Wound type: Pressure  Pressure Injury POA: Yes Measurement:  R medial ankle 3 cms x 2 cms x 0.1 cms Stage 4 Pressure Injury  50% yellow fibrinous tissue 50% red moist  R heel 2.5 cms x 2 cms x 0.2 cms Stage 4 Pressure Injury 100% red  L heel Unstageable Pressure Injury 2 cms x 2 cms x 0.1 80% black devitalized tissue 20% pink moist R hip Unstageable Pressure Injury 1 cm x 2 cms x 1.5 cms 100% tan devitalized tissue  Sacrum Stage 4 Pressure Injury 1.5 cms x 1 cms x 0.5 cms 100% red moist  Drainage (amount, consistency, odor) Minimal serous drainage noted to R medial ankle and R heel, Left heel dry, R hip and Sacrum minimal serosanguinous  Periwound: callus and scaling skin surrounding heel wounds, Hip and Sacrum surrounding skin intact  Dressing procedure/placement/frequency:  R medial ankle and R heel cleanse with soap and water, Cut piece of silver hydrofiber (Aquacel Leward Quan [phone]) to fit, apply to wound bed, cover with foam dressing.  L heel cleanse with soap and water, apply Medihoney to gauze and place in wound bed, cover with dry gauze and foam dressing.  Sacrum cleanse with NS, cut piece of silver hydrofiber (Aquacel Leward Quan [phone]) to fit, use q tip applicator to make sure hydrofiber covers wound bed, cover with dry gauze and foam dressing.  All foam dressings to be changed q3 days and prn soiling.   Place bilateral feet in Prevalon boots Kellie Simmering [phone]) to offload pressure on heels.  Patient would benefit from low air loss mattress throughout her hospital stay for moisture management and pressure redistribution.   WOC will not follow patient at this time.  Please re-consult if further needs arise.   Thank you,     Jacqualin Shirkey MSN, RN-BC, [employer]

## 2022-12-19 NOTE — Progress Notes (Signed)
CODE SEPSIS - PHARMACY COMMUNICATION  **Broad Spectrum Antibiotics should be administered within 1 hour of Sepsis diagnosis**  Time Code Sepsis Called/Page Received: 0350  Antibiotics Ordered: Cefepime, Flagyl, Vancomycin  Time of 1st antibiotic administration: [place], PharmD, [facility] 12/19/2022 3:53 AM

## 2022-12-19 NOTE — ED Notes (Signed)
Wound care nurses at bedside.

## 2022-12-19 NOTE — ED Triage Notes (Addendum)
BIB [facility] from home where son is caretaker. Called out for concern of fever. Last dose tylenol 1900. Pt in wheelchair at baseline. EMS reports temp of 99 axillary temp. BGL 132. HR 110. CO2 20. RR 26. Pt arrives confused. Pt has recent hospital admissions/visits. Pt reported abd pain to EMS with tenderness in all fields. Denies n/v/d.   20 G RAC placed by EMS. 500 mL IVF given en route.

## 2022-12-19 NOTE — ED Notes (Signed)
Pt off bedpan, small void

## 2022-12-19 NOTE — ED Notes (Signed)
RN spoke with MD Beather Arbour regarding pt R limb restriction and placement of PIV in RAC by EMS in field. MD states to leave PIV in place and refrain from further IV attempts or blood pressure cuff placement on R arm. IV team consult placed for second IV placement on L side.

## 2022-12-19 NOTE — ED Provider Notes (Signed)
[facility] Provider Note    Event Date/Time   First MD Initiated Contact with Patient 12/19/22 [id number]     (approximate)   History   Weakness, Fever, and Abdominal Pain   HPI  Level V caveat: Limited by decreased LOC  [name] is a 82 y.o. female brought to the ED via EMS from home with a chief complaint of fever and altered mentation.  Last dose of Tylenol around 7 PM.  There was also report by patient's son that she was experiencing abdominal pain.  Rest of history is limited secondary to patient's decreased LOC.     Past Medical History   Past Medical History:  Diagnosis Date   Arthritis    Arthritis    Breast cancer ([place]) 1992   right breast with lumpectomy and rad tx   Cancer of right breast ([place]) 04/16/2013   right breast with mastectomy   Diabetes mellitus without complication ([place])    Gout    High cholesterol    Hyperlipidemia    Hypertension    Personal history of radiation therapy      Active Problem List   Patient Active Problem List   Diagnosis Date Noted   Reactive thrombocytosis 11/04/2022   Acute deep vein thrombosis (DVT) of femoral vein of left lower extremity ([place]) 11/02/2022   Chronic ulcer of great toe of left foot with fat layer exposed ([place]) 11/02/2022   Acute on chronic anemia 11/02/2022   Acute on chronic urinary retention 11/02/2022   Thrombus 11/02/2022   Pressure injury of skin 41/66/0630   Acute metabolic encephalopathy 16/11/930   Severe sepsis ([place]) 08/22/2022   Acute diverticulitis 08/22/2022   Hydroureteronephrosis 08/22/2022   Hypokalemia 08/22/2022   Malnutrition of moderate degree 08/18/2022   Cellulitis of lower extremity 08/17/2022   Cellulitis of both lower extremities 08/16/2022   Diabetes mellitus without complication (HCC)    Gout    Chronic diastolic CHF (congestive heart failure) ([place])    Iron deficiency anemia    Diarrhea    Cervical spondylosis 03/09/2020   Lower limb ulcer,  ankle, right, limited to breakdown of skin ([place]) 09/09/2018   Chronic gouty arthritis 04/29/2018   Generalized osteoarthritis of hand 02/12/2018   Hyperuricemia 02/12/2018   Swelling of finger 02/12/2018   PAD (peripheral artery disease) ([place]) 04/30/2017   Lymphedema 04/30/2017   Bilateral edema of lower extremity 04/03/2017   Ecchymosis 04/03/2017   Leg pain, bilateral 04/03/2017   Acquired hypothyroidism 01/24/2017   Benign essential hypertension 01/24/2017   History of mastectomy, right 01/24/2017   Primary osteoarthritis of left knee 02/29/2016   Cancer of right breast ([place]) 04/17/2015   Chronic pain 03/03/2015   Chronic pain of both knees 03/03/2015   Arthritis 01/11/2014   Soft tissue lesion of shoulder region 01/11/2014   Diabetes mellitus, type 2 ([place]) 01/11/2014   H/O renal calculi 01/11/2014   Hypertension 01/11/2014   HLD (hyperlipidemia) 01/11/2014   Neuropathy 01/11/2014   OP (osteoporosis) 01/11/2014   H/O malignant neoplasm of breast 01/11/2014     Past Surgical History   Past Surgical History:  Procedure Laterality Date   ABDOMINAL HYSTERECTOMY     ANKLE FRACTURE SURGERY Right 2004   BREAST LUMPECTOMY Right 1992   positive   IRRIGATION AND DEBRIDEMENT FOOT Right 11/02/2022   Procedure: IRRIGATION AND DEBRIDEMENT RIGHT FOOT AND BONE BIOPSY RIGHT FOOT;  Surgeon: Samara Deist, DPM;  Location: [facility] ORS;  Service: Podiatry;  Laterality: Right;  IVC FILTER INSERTION N/A 11/06/2022   Procedure: IVC FILTER INSERTION;  Surgeon: Algernon Huxley, MD;  Location: [facility];  Service: Cardiovascular;  Laterality: N/A;   MASTECTOMY Right 2014   PERIPHERAL VASCULAR THROMBECTOMY Left 11/06/2022   Procedure: PERIPHERAL VASCULAR THROMBECTOMY- EXTERNAL Iliac Vein/CFV;  Surgeon: Algernon Huxley, MD;  Location: [facility];  Service: Cardiovascular;  Laterality: Left;   SHOULDER SURGERY Right 2010     Home Medications   Prior to Admission medications    Medication Sig Start Date End Date Taking? Authorizing Provider  acetaminophen (TYLENOL) 500 MG tablet Take 500-1,000 mg by mouth 4 (four) times daily as needed for mild pain or moderate pain.    [provider]  allopurinol (ZYLOPRIM) 300 MG tablet Take 300 mg by mouth daily.    [provider]  atenolol (TENORMIN) 50 MG tablet Take 50 mg by mouth daily.     [provider]  atorvastatin (LIPITOR) 40 MG tablet Take 40 mg by mouth daily.     [provider]  diphenoxylate-atropine (LOMOTIL) 2.5-0.025 MG tablet Take 1-2 tablets by mouth 3 (three) times daily as needed. 08/20/22   [provider]  furosemide (LASIX) 40 MG tablet Take 40 mg by mouth daily.    [provider]  gentamicin cream (GARAMYCIN) 0.1 % Apply 1 Application topically in the morning and at bedtime.    [provider]  HYDROcodone-acetaminophen (NORCO) 7.5-325 MG tablet Take 1 tablet by mouth 2 (two) times daily as needed for moderate pain or severe pain.    [provider]  levothyroxine (SYNTHROID) 88 MCG tablet Take 88 mcg by mouth daily.    [provider]  lisinopril (ZESTRIL) 5 MG tablet Take 5 mg by mouth daily. 09/24/22 09/24/23  [provider]  metFORMIN (GLUCOPHAGE) 1000 MG tablet Take 1,000 mg by mouth in the morning and at bedtime.    [provider]  sitaGLIPtin (JANUVIA) 50 MG tablet Take 50 mg by mouth daily.    [provider]  vitamin B-12 (CYANOCOBALAMIN) 1000 MCG tablet Take 1,000 mcg by mouth daily.    [provider]     Allergies  Other and Sulfa antibiotics   Family History   Family History  Problem Relation Age of Onset   Diabetes Father    Breast cancer Neg Hx      Physical Exam  Triage Vital Signs: ED Triage Vitals  Enc Vitals Group     BP 12/19/22 0340 (!) 155/74     Pulse Rate 12/19/22 0340 (!) 117     Resp 12/19/22 0340 (!) 25     Temp 12/19/22 0345 (!) 102.3 F  (39.1 C)     Temp Source 12/19/22 0340 Rectal     SpO2 12/19/22 0338 95 %     Weight 12/19/22 0338 109 lb 9.6 oz (49.7 kg)     Height 12/19/22 0338 '4\' 11"'$  (1.499 m)     Head Circumference --      Peak Flow --      Pain Score --      Pain Loc --      Pain Edu? --      Excl. in [place]? --     Updated Vital Signs: BP 94/63   Pulse 96   Temp (!) 102.3 F (39.1 C) (Rectal)   Resp 20   Ht '4\' 11"'$  (1.499 m)   Wt 49.7 kg   SpO2 100%   BMI  22.14 kg/m    General: Awake, mild distress.  CV:  Tachycardic.  Good peripheral perfusion.  Resp:  Increased effort.  CTAB. Abd:  Nontender.  No distention.  Other:  Dirty bandages to feet which were unwrapped to reveal ulcers to both feet with malodorous drainage.   ED Results / Procedures / Treatments  Labs (all labs ordered are listed, but only abnormal results are displayed) Labs Reviewed  LACTIC ACID, PLASMA - Abnormal; Notable for the following components:      Result Value   Lactic Acid, Venous 2.8 (*)    All other components within normal limits  LACTIC ACID, PLASMA - Abnormal; Notable for the following components:   Lactic Acid, Venous 2.1 (*)    All other components within normal limits  CBC WITH DIFFERENTIAL/PLATELET - Abnormal; Notable for the following components:   WBC 21.7 (*)    RBC 3.76 (*)    Hemoglobin 8.9 (*)    HCT 29.8 (*)    MCV 79.3 (*)    MCH 23.7 (*)    MCHC 29.9 (*)    RDW 17.9 (*)    Platelets 517 (*)    Neutro Abs 18.6 (*)    Abs Immature Granulocytes 0.20 (*)    All other components within normal limits  COMPREHENSIVE METABOLIC PANEL - Abnormal; Notable for the following components:   CO2 18 (*)    Glucose, Bld 192 (*)    Calcium 8.1 (*)    Albumin 2.8 (*)    GFR, Estimated 59 (*)    All other components within normal limits  URINALYSIS, W/ REFLEX TO CULTURE (INFECTION SUSPECTED) - Abnormal; Notable for the following components:   Color, Urine YELLOW (*)    APPearance HAZY (*)    Hgb urine  dipstick SMALL (*)    Protein, ur 100 (*)    Leukocytes,Ua MODERATE (*)    Bacteria, UA RARE (*)    All other components within normal limits  TROPONIN I (HIGH SENSITIVITY) - Abnormal; Notable for the following components:   Troponin I (High Sensitivity) 2,280 (*)    All other components within normal limits  TROPONIN I (HIGH SENSITIVITY) - Abnormal; Notable for the following components:   Troponin I (High Sensitivity) 2,583 (*)    All other components within normal limits  RESP PANEL BY RT-PCR (RSV, FLU A&B, COVID)  [id number]  CULTURE, BLOOD (ROUTINE X 2)  CULTURE, BLOOD (ROUTINE X 2)  URINE CULTURE  AEROBIC/ANAEROBIC CULTURE W GRAM STAIN (SURGICAL/DEEP WOUND)  LIPASE, BLOOD  PROCALCITONIN  APTT  PROTIME-INR     EKG  ED ECG REPORT I, Rosalie Buenaventura J, the attending physician, personally viewed and interpreted this ECG.   Date: 12/19/2022  EKG Time: 0339  Rate: 107  Rhythm: sinus tachycardia  Axis: Normal  Intervals:none  ST&T Change: Nonspecific    RADIOLOGY I have independently visualized and interpreted patient's CTs and x-rays as well as noted the radiology interpretation:  CT head: No ICH  CT abdomen/pelvis: No acute process  Chest x-ray: No acute process  Left foot x-ray: No osteomyelitis  Right foot x-ray: No osteomyelitis  Official radiology report(s): CT Head Wo Contrast  Result Date: 12/19/2022 CLINICAL DATA:  82 year old female with altered mental status. Fever. EXAM: CT HEAD WITHOUT CONTRAST TECHNIQUE: Contiguous axial images were obtained from the base of the skull through the vertex without intravenous contrast. RADIATION DOSE REDUCTION: This exam was performed according to the departmental dose-optimization program which includes automated exposure control, adjustment of the mA  and/or kV according to patient size and/or use of iterative reconstruction technique. COMPARISON:  None Available. FINDINGS: Brain: No midline shift, ventriculomegaly, mass effect,  evidence of mass lesion, intracranial hemorrhage or evidence of cortically based acute infarction. No cortical encephalomalacia identified. Mild to moderate for age scattered bilateral cerebral white matter hypodensity with some deep white matter capsule involvement. Mild heterogeneity in the left basal ganglia. Vascular: Calcified atherosclerosis at the skull base. No suspicious intracranial vascular hyperdensity. Skull: Intact, negative. Sinuses/Orbits: Visualized paranasal sinuses and mastoids are clear. Other: No acute orbit or scalp soft tissue finding. IMPRESSION: 1. No acute intracranial abnormality. 2. Mild to moderate heterogeneity in the cerebral white matter and left basal for age ganglia compatible with small vessel disease. Electronically Signed   By: Genevie Ann M.D.   On: 12/19/2022 06:32   CT Abdomen Pelvis W Contrast  Result Date: 12/19/2022 CLINICAL DATA:  82 year old female with history of acute onset of nonlocalized abdominal pain. EXAM: CT ABDOMEN AND PELVIS WITH CONTRAST TECHNIQUE: Multidetector CT imaging of the abdomen and pelvis was performed using the standard protocol following bolus administration of intravenous contrast. RADIATION DOSE REDUCTION: This exam was performed according to the departmental dose-optimization program which includes automated exposure control, adjustment of the mA and/or kV according to patient size and/or use of iterative reconstruction technique. CONTRAST:  34m OMNIPAQUE IOHEXOL 300 MG/ML  SOLN COMPARISON:  CT of the abdomen and pelvis 11/01/2022. FINDINGS: Lower chest: Trace bilateral pleural effusions lying dependently. Atherosclerotic calcifications are noted in the descending thoracic aorta. Calcifications of the mitral annulus. Hepatobiliary: No definite suspicious cystic or solid hepatic lesions are noted. No intra or extrahepatic biliary ductal dilatation. Status post cholecystectomy. Pancreas: No pancreatic mass. No pancreatic ductal dilatation. No  pancreatic or peripancreatic fluid collections or inflammatory changes. Spleen: Unremarkable. Adrenals/Urinary Tract: Extensive cortical thinning noted in the anterior aspect of the left kidney, presumably scarring from prior infection or infarction. Right kidney and bilateral adrenal glands are otherwise normal in appearance. No hydroureteronephrosis. Urinary bladder is moderately distended, and base of the urinary bladder extends well below the level of the pubococcygeal line at rest, indicative of a cystocele from pelvic floor laxity. Stomach/Bowel: The appearance of the stomach is normal. No pathologic dilatation of small bowel or colon. Numerous colonic diverticuli are noted, without definite focal surrounding inflammatory changes to indicate an acute diverticulitis at this time. Normal appendix. Vascular/Lymphatic: Aortic atherosclerosis, without evidence of aneurysm or dissection in the abdominal or pelvic vasculature. IVC filter noted, with tip terminating shortly below the level of the renal veins. No lymphadenopathy noted in the abdomen or pelvis. Reproductive: Status post hysterectomy. Ovaries are not confidently identified may be surgically absent or atrophic. Other: No significant volume of ascites.  No pneumoperitoneum. Musculoskeletal: There are no aggressive appearing lytic or blastic lesions noted in the visualized portions of the skeleton. IMPRESSION: 1. No definite acute findings noted in the abdomen or pelvis to account for the patient's symptoms. 2. Trace bilateral pleural effusions lying dependently. 3. Colonic diverticulosis without evidence of acute diverticulitis at this time. 4. Aortic atherosclerosis. 5. Pelvic floor laxity with cystocele. 6. Additional incidental findings, as above. Electronically Signed   By: DVinnie LangtonM.D.   On: 12/19/2022 06:26   DG Foot Complete Right  Result Date: 12/19/2022 CLINICAL DATA:  82year old female with fever, altered mental status. Foot wound.  Query osteomyelitis. EXAM: RIGHT FOOT COMPLETE - 3+ VIEW COMPARISON:  Calcaneus series 11/02/2022. FINDINGS: Bulky chronic degenerative and/or post arthrodesis changes at  the right ankle joint. Chronic sclerosis and cannulated screw fragment there appear grossly stable since December. Bulky chronic dorsal calcaneus degenerative spurring with overlying soft tissue deficiency. But no osteolysis to indicate osteomyelitis. No soft tissue gas. No superimposed acute fracture or dislocation identified in the right foot. Hallux valgus and metatarsus primus varus. IMPRESSION: 1. Soft tissue deficiency overlying the dorsal calcaneus but no radiographic evidence of osteomyelitis. 2. Bulky chronic degenerative and/or post arthrodesis changes at the right ankle, with retained screw fragment. Electronically Signed   By: Genevie Ann M.D.   On: 12/19/2022 04:21   DG Foot Complete Left  Result Date: 12/19/2022 CLINICAL DATA:  82 year old female with fever, altered mental status. Foot wound. Query osteomyelitis. EXAM: LEFT FOOT - COMPLETE 3+ VIEW COMPARISON:  None Available. FINDINGS: Dorsal soft tissue wound overlying the calcaneus. Achilles insertion degenerative spurring at the calcaneus there, mildly discontinuous but no cortical osteolysis to suggest osteomyelitis. No tracking soft tissue gas. Hallux valgus and metatarsus primus varus. No acute fracture or dislocation identified. Some calcified peripheral vascular disease. IMPRESSION: 1. Dorsal soft tissue wound overlying the calcaneus with no radiographic evidence of Osteomyelitis. 2.  No acute osseous abnormality identified. Electronically Signed   By: Genevie Ann M.D.   On: 12/19/2022 04:18   DG Chest Port 1 View  Result Date: 12/19/2022 CLINICAL DATA:  82 year old female with fever, altered mental status. Confusion. EXAM: PORTABLE CHEST 1 VIEW COMPARISON:  Portable chest 11/01/2022 and earlier. FINDINGS: Portable AP semi upright view at 0401 hours. Mildly rotated to the right.  Improved lung volumes compared to prior portable exams. Mediastinal contours are within normal limits. Allowing for portable technique the lungs are clear. No pneumothorax or pleural effusion identified. Chronic degenerative and postoperative changes at the shoulders. No acute osseous abnormality identified. Paucity of bowel gas in the visible abdomen. IMPRESSION: No acute cardiopulmonary abnormality. Electronically Signed   By: Genevie Ann M.D.   On: 12/19/2022 04:16     PROCEDURES:  Critical Care performed: Yes, see critical care procedure note(s)  CRITICAL CARE Performed by: Paulette Blanch   Total critical care time: 45 minutes  Critical care time was exclusive of separately billable procedures and treating other patients.  Critical care was necessary to treat or prevent imminent or life-threatening deterioration.  Critical care was time spent personally by me on the following activities: development of treatment plan with patient and/or surrogate as well as nursing, discussions with consultants, evaluation of patient's response to treatment, examination of patient, obtaining history from patient or surrogate, ordering and performing treatments and interventions, ordering and review of laboratory studies, ordering and review of radiographic studies, pulse oximetry and re-evaluation of patient's condition.   Marland Kitchen1-3 Lead EKG Interpretation  Performed by: Paulette Blanch, MD Authorized by: Paulette Blanch, MD     Interpretation: abnormal     ECG rate:  115   ECG rate assessment: tachycardic     Rhythm: sinus tachycardia     Ectopy: none     Conduction: normal   Comments:     Patient placed on cardiac monitor to evaluate for arrhythmias    MEDICATIONS ORDERED IN ED: Medications  acetaminophen (TYLENOL) suppository 650 mg ( Rectal Canceled Entry 12/19/22 0346)  vancomycin (VANCOCIN) IVPB 1000 mg/200 mL premix (1,000 mg Intravenous New Bag/Given 12/19/22 0603)  sodium chloride flush (NS) 0.9 %  injection 10-40 mL (has no administration in time range)  sodium chloride flush (NS) 0.9 % injection 10-40 mL (has no administration in time  range)  heparin bolus via infusion 3,000 Units (has no administration in time range)  heparin ADULT infusion 100 units/mL (25000 units/279m) (has no administration in time range)  sodium chloride 0.9 % bolus 1,000 mL (0 mLs Intravenous Stopped 12/19/22 0432)  lactated ringers bolus 1,000 mL (0 mLs Intravenous Stopped 12/19/22 0534)    And  lactated ringers bolus 500 mL (0 mLs Intravenous Stopped 12/19/22 0459)  ceFEPIme (MAXIPIME) 2 g in sodium chloride 0.9 % 100 mL IVPB (0 g Intravenous Stopped 12/19/22 0459)  metroNIDAZOLE (FLAGYL) IVPB 500 mg (0 mg Intravenous Stopped 12/19/22 0610)  acetaminophen (TYLENOL) suppository 975 mg (975 mg Rectal Given 12/19/22 0416)  iohexol (OMNIPAQUE) 300 MG/ML solution 75 mL (75 mLs Intravenous Contrast Given 12/19/22 0541)     IMPRESSION / MDM / ASSESSMENT AND PLAN / ED COURSE  I reviewed the triage vital signs and the nursing notes.                             82year old female presenting with fever and altered mentation. Differential diagnosis includes, but is not limited to, alcohol, illicit or prescription medications, or other toxic ingestion; intracranial pathology such as stroke or intracerebral hemorrhage; fever or infectious causes including sepsis; hypoxemia and/or hypercarbia; uremia; trauma; endocrine related disorders such as diabetes, hypoglycemia, and thyroid-related diseases; hypertensive encephalopathy; etc. I have personally reviewed patient's records and note a wound care visit on 12/05/2022.  Patient's presentation is most consistent with acute presentation with potential threat to life or bodily function.  The patient is on the cardiac monitor to evaluate for evidence of arrhythmia and/or significant heart rate changes.  Patient meets ED code sepsis criteria based on vital signs.  Will also obtain CT head, CT  abdomen/pelvis, chest x-ray, bilateral feet x-rays and wound culture.  Anticipate hospitalization.  Clinical Course as of 12/19/22 0648  Wed Dec 19, 2022  06387Elevated lactic acid and troponin noted.  Awaiting clear CT head then will start IV heparin.  [JS]  [id number] reassessment: Lactic acid trending down, now 2.1 [JS]  05643CT head, abdomen/pelvis negative for acute process.  Have ordered heparin bolus with drip.  Have consulted hospitalist services for evaluation and admission. [JS]    Clinical Course User Index [JS] SPaulette Blanch MD     FINAL CLINICAL IMPRESSION(S) / ED DIAGNOSES   Final diagnoses:  Sepsis, due to unspecified organism, unspecified whether acute organ dysfunction present ([place]  Fever, unspecified fever cause  Lower urinary tract infectious disease  Ulcer of both feet, limited to breakdown of skin ([facility])  NSTEMI (non-ST elevated myocardial infarction) ([place]  Leukocytosis, unspecified type     Rx / DC Orders   ED Discharge Orders     None        Note:  This document was prepared using Dragon voice recognition software and may include unintentional dictation errors.   SPaulette Blanch MD 12/19/22 [id number]

## 2022-12-19 NOTE — H&P (Signed)
History and Physical    [name]:[id number] DOB: 17-Sep-1941 DOA: 12/19/2022  Referring MD/NP/PA:   PCP: Tracie Harrier, MD   Patient coming from:  The patient is coming from home.  At baseline, pt is independent for most of ADL.        Chief Complaint: AMS and fever  HPI: [name] is a 82 y.o. female with medical history significant of dCHF, HTH, HLD, PVD, DVT (s/p of left IVC on 11/06/22), CKD-3a, kidney stone, diverticulitis, anemia, gout, hypothyroidism, chronic pressure ulcer in right hip, sacral ulcer, chronic ulcer in both heels of feet, who presents with AMS, fever.   Patient was recently hospitalized from 12/21 - 12/29 due to severe sepsis secondary to foot ulcer with infection.  Pt was seen by Dr. Vickki Muff of podiatry. Pt had left foot ulcer debridement and biopsy of bone of right heel was negative for osteomyelitis. Patient was also found to have acute left DVT. Pt has left lower extremity thrombectomy and IVC filter placement.   Per her son (I called her son by phone), pt has been confused in the past several days.  Patient has fever and chills.  Her body temperature is 102.3 in ED.  Does not have cough, shortness breath, respiratory distress.  Per report, patient seems to have some abdominal pain but cannot give detailed information to characterize her abdominal pain.  No active nausea, vomiting, diarrhea noted.  Not sure if patient has symptoms of UTI.  She moves all extremities.  Patient has been intermittently hypotension in the emergency room.  Initially patient had blood pressure 89/64, which improved to 94/63 after giving 2.5 L normal saline, then dropped to 70/52.  Patient was given 100 mg of Solu-Cortef, 12.5 g of albumin and started midodrine 10 mg 3 times daily.  Blood pressure improved to 100/71.  Data reviewed independently and ED Course: pt was found to have WBC 21.7, trop 2280 --> 2583 --> 2592,   positive urinalysis (hazy appearance, moderate  amount leukocyte, rare bacteria, WBC> 50), temperature 102.3, heart rate 117, 96, RR 34, 20, oxygen saturation 94-100% on room air.  Chest x-ray negative.  CT of abdomen/pelvis is negative for acute intra-abdominal issues, negative for hydronephrosis.  CT of head is negative for acute intracranial issue.  X-ray of left foot and right foot negative for bony fracture, no evidence of osteomyelitis.  Patient is admitted to stepdown as inpatient.  Dr. Mortimer Fries of ICU and Dr. Daniel Nones of card are consulted.   CT-abd/pelvis: 1. No definite acute findings noted in the abdomen or pelvis to account for the patient's symptoms. 2. Trace bilateral pleural effusions lying dependently. 3. Colonic diverticulosis without evidence of acute diverticulitis at this time. 4. Aortic atherosclerosis. 5. Pelvic floor laxity with cystocele  KG: I have personally reviewed.  Sinus rhythm, QTc 498, low voltage, poor R wave progression, anteroseptal infarction pattern.  Review of Systems: Could not be reviewed accurately due to altered mental status.   Allergy:  Allergies  Allergen Reactions   Other Anaphylaxis    Anesthisia has been a health issue for her in the past.    Sulfa Antibiotics Rash    Past Medical History:  Diagnosis Date   Arthritis    Arthritis    Breast cancer ([place]) 1992   right breast with lumpectomy and rad tx   Cancer of right breast ([place]) 04/16/2013   right breast with mastectomy   Diabetes mellitus without complication ([place])    Gout  High cholesterol    Hyperlipidemia    Hypertension    Personal history of radiation therapy     Past Surgical History:  Procedure Laterality Date   ABDOMINAL HYSTERECTOMY     ANKLE FRACTURE SURGERY Right 2004   BREAST LUMPECTOMY Right 1992   positive   IRRIGATION AND DEBRIDEMENT FOOT Right 11/02/2022   Procedure: IRRIGATION AND DEBRIDEMENT RIGHT FOOT AND BONE BIOPSY RIGHT FOOT;  Surgeon: Samara Deist, DPM;  Location: [facility];  Service: Podiatry;   Laterality: Right;   IVC FILTER INSERTION N/A 11/06/2022   Procedure: IVC FILTER INSERTION;  Surgeon: Algernon Huxley, MD;  Location: [facility];  Service: Cardiovascular;  Laterality: N/A;   MASTECTOMY Right 2014   PERIPHERAL VASCULAR THROMBECTOMY Left 11/06/2022   Procedure: PERIPHERAL VASCULAR THROMBECTOMY- EXTERNAL Iliac Vein/CFV;  Surgeon: Algernon Huxley, MD;  Location: [facility];  Service: Cardiovascular;  Laterality: Left;   SHOULDER SURGERY Right 2010    Social History:  reports that she has never smoked. She has never been exposed to tobacco smoke. She has never used smokeless tobacco. She reports that she does not drink alcohol and does not use drugs.  Family History:  Family History  Problem Relation Age of Onset   Diabetes Father    Breast cancer Neg Hx      Prior to Admission medications   Medication Sig Start Date End Date Taking? Authorizing Provider  acetaminophen (TYLENOL) 500 MG tablet Take 500-1,000 mg by mouth 4 (four) times daily as needed for mild pain or moderate pain.    [provider]  allopurinol (ZYLOPRIM) 300 MG tablet Take 300 mg by mouth daily.    [provider]  atenolol (TENORMIN) 50 MG tablet Take 50 mg by mouth daily.     [provider]  atorvastatin (LIPITOR) 40 MG tablet Take 40 mg by mouth daily.     [provider]  diphenoxylate-atropine (LOMOTIL) 2.5-0.025 MG tablet Take 1-2 tablets by mouth 3 (three) times daily as needed. 08/20/22   [provider]  furosemide (LASIX) 40 MG tablet Take 40 mg by mouth daily.    [provider]  gentamicin cream (GARAMYCIN) 0.1 % Apply 1 Application topically in the morning and at bedtime.    [provider]  HYDROcodone-acetaminophen (NORCO) 7.5-325 MG tablet Take 1 tablet by mouth 2 (two) times daily as needed for moderate pain or severe pain.    [provider]  levothyroxine (SYNTHROID) 88 MCG tablet Take 88 mcg by mouth  daily.    [provider]  lisinopril (ZESTRIL) 5 MG tablet Take 5 mg by mouth daily. 09/24/22 09/24/23  [provider]  metFORMIN (GLUCOPHAGE) 1000 MG tablet Take 1,000 mg by mouth in the morning and at bedtime.    [provider]  sitaGLIPtin (JANUVIA) 50 MG tablet Take 50 mg by mouth daily.    [provider]  vitamin B-12 (CYANOCOBALAMIN) 1000 MCG tablet Take 1,000 mcg by mouth daily.    [provider]    Physical Exam: Vitals:   12/19/22 1630 12/19/22 1700 12/19/22 1730 12/19/22 1845  BP: 98/66 107/75 103/65 94/64  Pulse: 75 78 78 77  Resp: '17 18 15 19  '$ Temp:      TempSrc:      SpO2: 100% 100% 100% 99%  Weight:      Height:       General: Not in acute distress HEENT:       Eyes: PERRL,  EOMI, no scleral icterus.       ENT: No discharge from the ears and nose       Neck: No JVD, no bruit, no mass felt. Heme: No neck lymph node enlargement. Cardiac: S1/S2, RRR, No murmurs, No gallops or rubs. Respiratory: No rales, wheezing, rhonchi or rubs. GI: Soft, nondistended, questionable tenderness in central abdomen, no organomegaly, BS present. GU: No hematuria Ext: No pitting leg edema bilaterally. 1+DP/PT pulse bilaterally. Musculoskeletal: No joint deformities, No joint redness or warmth, no limitation of ROM in spin. Skin: has sacrum Stage 4 Pressure, has right hip pressure ulcer. Has ulcers in both heel and right ankle. The left heel ulcer is necrotic.           Neuro confused, knows her own name, not oriented to place, knows year 2023, cranial nerves II-XII grossly intact, moves all extremities  Psych: Patient is not psychotic, no suicidal or hemocidal ideation.  Labs on Admission: I have personally reviewed following labs and imaging studies  CBC: Recent Labs  Lab 12/19/22 0353  WBC 21.7*  NEUTROABS 18.6*  HGB 8.9*  HCT 29.8*  MCV 79.3*  PLT 007*   Basic Metabolic Panel: Recent Labs  Lab 12/19/22 0353  NA  136  K 3.8  CL 108  CO2 18*  GLUCOSE 192*  BUN 20  CREATININE 0.98  CALCIUM 8.1*   GFR: Estimated Creatinine Clearance: 31.7 mL/min (by C-G formula based on SCr of 0.98 mg/dL). Liver Function Tests: Recent Labs  Lab 12/19/22 0353  AST 27  ALT 9  ALKPHOS 64  BILITOT 0.7  PROT 7.6  ALBUMIN 2.8*   Recent Labs  Lab 12/19/22 0353  LIPASE 35   No results for input(s): "AMMONIA" in the last 168 hours. Coagulation Profile: Recent Labs  Lab 12/19/22 0756  INR 1.5*   Cardiac Enzymes: No results for input(s): "CKTOTAL", "CKMB", "CKMBINDEX", "TROPONINI" in the last 168 hours. BNP (last 3 results) No results for input(s): "PROBNP" in the last 8760 hours. HbA1C: Recent Labs    12/19/22 0353  HGBA1C 6.1*   CBG: Recent Labs  Lab 12/19/22 0805 12/19/22 1118 12/19/22 1633  GLUCAP 193* 126* 153*   Lipid Profile: No results for input(s): "CHOL", "HDL", "LDLCALC", "TRIG", "CHOLHDL", "LDLDIRECT" in the last 72 hours. Thyroid Function Tests: No results for input(s): "TSH", "T4TOTAL", "FREET4", "T3FREE", "THYROIDAB" in the last 72 hours. Anemia Panel: No results for input(s): "VITAMINB12", "FOLATE", "FERRITIN", "TIBC", "IRON", "RETICCTPCT" in the last 72 hours. Urine analysis:    Component Value Date/Time   COLORURINE YELLOW (A) 12/19/2022 0343   APPEARANCEUR HAZY (A) 12/19/2022 0343   APPEARANCEUR Cloudy (A) 09/08/2018 1519   LABSPEC 1.017 12/19/2022 0343   LABSPEC 1.016 05/20/2012 1345   PHURINE 6.0 12/19/2022 0343   GLUCOSEU NEGATIVE 12/19/2022 0343   GLUCOSEU Negative 05/20/2012 1345   HGBUR SMALL (A) 12/19/2022 0343   BILIRUBINUR NEGATIVE 12/19/2022 0343   BILIRUBINUR Negative 09/08/2018 1519   BILIRUBINUR Negative 05/20/2012 1345   KETONESUR NEGATIVE 12/19/2022 0343   PROTEINUR 100 (A) 12/19/2022 0343   NITRITE NEGATIVE 12/19/2022 0343   LEUKOCYTESUR MODERATE (A) 12/19/2022 0343   LEUKOCYTESUR 3+ 05/20/2012 1345   Sepsis  Labs: '@LABRCNTIP'$ (procalcitonin:4,lacticidven:4) ) Recent Results (from the past 240 hour(s))  Culture, blood (routine x 2)     Status: None (Preliminary result)   Collection Time: 12/19/22  3:43 AM   Specimen: BLOOD  Result Value Ref Range Status   Specimen Description BLOOD RIGHT [facility]  Final   Special  Requests   Final    BOTTLES DRAWN AEROBIC AND ANAEROBIC Blood Culture results may not be optimal due to an excessive volume of blood received in culture bottles   Culture   Final    NO GROWTH < 12 HOURS Performed at [facility], [address] [place], [place] [address]    Report Status PENDING  Incomplete  Resp panel by RT-PCR (RSV, Flu A&B, Covid) Urine, Catheterized     Status: None   Collection Time: 12/19/22  3:43 AM   Specimen: Urine, Catheterized; Nasal Swab  Result Value Ref Range Status   SARS Coronavirus 2 by RT PCR NEGATIVE NEGATIVE Final    Comment: (NOTE) SARS-CoV-2 target nucleic acids are NOT DETECTED.  The SARS-CoV-2 RNA is generally detectable in upper respiratory specimens during the acute phase of infection. The lowest concentration of SARS-CoV-2 viral copies this assay can detect is 138 copies/mL. A negative result does not preclude SARS-Cov-2 infection and should not be used as the sole basis for treatment or other patient management decisions. A negative result may occur with  improper specimen collection/handling, submission of specimen other than nasopharyngeal swab, presence of viral mutation(s) within the areas targeted by this assay, and inadequate number of viral copies(<138 copies/mL). A negative result must be combined with clinical observations, patient history, and epidemiological information. The expected result is Negative.  Fact Sheet for Patients:  EntrepreneurPulse.com.au  Fact Sheet for Healthcare Providers:  IncredibleEmployment.be  This test is no t yet approved or cleared by the [place] FDA and  has been authorized for detection and/or diagnosis of SARS-CoV-2 by FDA under an Emergency Use Authorization (EUA). This EUA will remain  in effect (meaning this test can be used) for the duration of the COVID-19 declaration under Section 564(b)(1) of the Act, 21 U.S.C.section 360bbb-3(b)(1), unless the authorization is terminated  or revoked sooner.       Influenza A by PCR NEGATIVE NEGATIVE Final   Influenza B by PCR NEGATIVE NEGATIVE Final    Comment: (NOTE) The Xpert Xpress SARS-CoV-2/FLU/RSV plus assay is intended as an aid in the diagnosis of influenza from Nasopharyngeal swab specimens and should not be used as a sole basis for treatment. Nasal washings and aspirates are unacceptable for Xpert Xpress SARS-CoV-2/FLU/RSV testing.  Fact Sheet for Patients: EntrepreneurPulse.com.au  Fact Sheet for Healthcare Providers: IncredibleEmployment.be  This test is not yet approved or cleared by the [place] FDA and has been authorized for detection and/or diagnosis of SARS-CoV-2 by FDA under an Emergency Use Authorization (EUA). This EUA will remain in effect (meaning this test can be used) for the duration of the COVID-19 declaration under Section 564(b)(1) of the Act, 21 U.S.C. section 360bbb-3(b)(1), unless the authorization is terminated or revoked.     Resp Syncytial Virus by PCR NEGATIVE NEGATIVE Final    Comment: (NOTE) Fact Sheet for Patients: EntrepreneurPulse.com.au  Fact Sheet for Healthcare Providers: IncredibleEmployment.be  This test is not yet approved or cleared by the [place] FDA and has been authorized for detection and/or diagnosis of SARS-CoV-2 by FDA under an Emergency Use Authorization (EUA). This EUA will remain in effect (meaning this test can be used) for the duration of the COVID-19 declaration under Section 564(b)(1) of the Act, 21 U.S.C. section  360bbb-3(b)(1), unless the authorization is terminated or revoked.  Performed at [facility], [address], [place], [place] [address]   Urine Culture     Status: None (Preliminary result)   Collection Time: 12/19/22  3:43 AM   Specimen: Urine, Catheterized  Result Value Ref Range Status   Specimen Description   Final    URINE, CATHETERIZED Performed at [facility], [address], [place], [place] [address]    Special Requests   Final    NONE Reflexed from [id number] Performed at [facility], [address] [place], [place] [address]    Culture PENDING  Incomplete   Report Status PENDING  Incomplete  Culture, blood (routine x 2)     Status: None (Preliminary result)   Collection Time: 12/19/22  3:53 AM   Specimen: BLOOD  Result Value Ref Range Status   Specimen Description BLOOD LEFT FOREARM  Final   Special Requests   Final    BOTTLES DRAWN AEROBIC AND ANAEROBIC Blood Culture results may not be optimal due to an inadequate volume of blood received in culture bottles   Culture   Final    NO GROWTH < 12 HOURS Performed at [facility], [address] [place], [place] [address]    Report Status PENDING  Incomplete  Aerobic/Anaerobic Culture w Gram Stain (surgical/deep wound)     Status: None (Preliminary result)   Collection Time: 12/19/22  5:27 AM   Specimen: Foot  Result Value Ref Range Status   Specimen Description   Final    FOOT Performed at [facility], [address] [place], [place] [address]    Special Requests   Final    NONE Performed at [facility], [address] [place], [place] [address]    Gram Stain   Final    RARE WBC PRESENT, PREDOMINANTLY PMN FEW GRAM POSITIVE COCCI IN PAIRS IN CLUSTERS Performed at [facility], [address], [place],  [address]    Culture PENDING  Incomplete   Report Status PENDING  Incomplete     Radiological Exams on Admission: CT Head Wo  Contrast  Result Date: 12/19/2022 CLINICAL DATA:  82 year old female with altered mental status. Fever. EXAM: CT HEAD WITHOUT CONTRAST TECHNIQUE: Contiguous axial images were obtained from the base of the skull through the vertex without intravenous contrast. RADIATION DOSE REDUCTION: This exam was performed according to the departmental dose-optimization program which includes automated exposure control, adjustment of the mA and/or kV according to patient size and/or use of iterative reconstruction technique. COMPARISON:  None Available. FINDINGS: Brain: No midline shift, ventriculomegaly, mass effect, evidence of mass lesion, intracranial hemorrhage or evidence of cortically based acute infarction. No cortical encephalomalacia identified. Mild to moderate for age scattered bilateral cerebral white matter hypodensity with some deep white matter capsule involvement. Mild heterogeneity in the left basal ganglia. Vascular: Calcified atherosclerosis at the skull base. No suspicious intracranial vascular hyperdensity. Skull: Intact, negative. Sinuses/Orbits: Visualized paranasal sinuses and mastoids are clear. Other: No acute orbit or scalp soft tissue finding. IMPRESSION: 1. No acute intracranial abnormality. 2. Mild to moderate heterogeneity in the cerebral white matter and left basal for age ganglia compatible with small vessel disease. Electronically Signed   By: Genevie Ann M.D.   On: 12/19/2022 06:32   CT Abdomen Pelvis W Contrast  Result Date: 12/19/2022 CLINICAL DATA:  82 year old female with history of acute onset of nonlocalized abdominal pain. EXAM: CT ABDOMEN AND PELVIS WITH CONTRAST TECHNIQUE: Multidetector CT imaging of the abdomen and pelvis was performed using the standard protocol following bolus administration of intravenous contrast. RADIATION DOSE REDUCTION: This exam was performed according to the departmental dose-optimization program which includes automated exposure  control, adjustment of the mA  and/or kV according to patient size and/or use of iterative reconstruction technique. CONTRAST:  62m OMNIPAQUE IOHEXOL 300 MG/ML  SOLN COMPARISON:  CT of the abdomen and pelvis 11/01/2022. FINDINGS: Lower chest: Trace bilateral pleural effusions lying dependently. Atherosclerotic calcifications are noted in the descending thoracic aorta. Calcifications of the mitral annulus. Hepatobiliary: No definite suspicious cystic or solid hepatic lesions are noted. No intra or extrahepatic biliary ductal dilatation. Status post cholecystectomy. Pancreas: No pancreatic mass. No pancreatic ductal dilatation. No pancreatic or peripancreatic fluid collections or inflammatory changes. Spleen: Unremarkable. Adrenals/Urinary Tract: Extensive cortical thinning noted in the anterior aspect of the left kidney, presumably scarring from prior infection or infarction. Right kidney and bilateral adrenal glands are otherwise normal in appearance. No hydroureteronephrosis. Urinary bladder is moderately distended, and base of the urinary bladder extends well below the level of the pubococcygeal line at rest, indicative of a cystocele from pelvic floor laxity. Stomach/Bowel: The appearance of the stomach is normal. No pathologic dilatation of small bowel or colon. Numerous colonic diverticuli are noted, without definite focal surrounding inflammatory changes to indicate an acute diverticulitis at this time. Normal appendix. Vascular/Lymphatic: Aortic atherosclerosis, without evidence of aneurysm or dissection in the abdominal or pelvic vasculature. IVC filter noted, with tip terminating shortly below the level of the renal veins. No lymphadenopathy noted in the abdomen or pelvis. Reproductive: Status post hysterectomy. Ovaries are not confidently identified may be surgically absent or atrophic. Other: No significant volume of ascites.  No pneumoperitoneum. Musculoskeletal: There are no aggressive appearing lytic or blastic lesions noted in the  visualized portions of the skeleton. IMPRESSION: 1. No definite acute findings noted in the abdomen or pelvis to account for the patient's symptoms. 2. Trace bilateral pleural effusions lying dependently. 3. Colonic diverticulosis without evidence of acute diverticulitis at this time. 4. Aortic atherosclerosis. 5. Pelvic floor laxity with cystocele. 6. Additional incidental findings, as above. Electronically Signed   By: DVinnie LangtonM.D.   On: 12/19/2022 06:26   DG Foot Complete Right  Result Date: 12/19/2022 CLINICAL DATA:  82year old female with fever, altered mental status. Foot wound. Query osteomyelitis. EXAM: RIGHT FOOT COMPLETE - 3+ VIEW COMPARISON:  Calcaneus series 11/02/2022. FINDINGS: Bulky chronic degenerative and/or post arthrodesis changes at the right ankle joint. Chronic sclerosis and cannulated screw fragment there appear grossly stable since December. Bulky chronic dorsal calcaneus degenerative spurring with overlying soft tissue deficiency. But no osteolysis to indicate osteomyelitis. No soft tissue gas. No superimposed acute fracture or dislocation identified in the right foot. Hallux valgus and metatarsus primus varus. IMPRESSION: 1. Soft tissue deficiency overlying the dorsal calcaneus but no radiographic evidence of osteomyelitis. 2. Bulky chronic degenerative and/or post arthrodesis changes at the right ankle, with retained screw fragment. Electronically Signed   By: HGenevie AnnM.D.   On: 12/19/2022 04:21   DG Foot Complete Left  Result Date: 12/19/2022 CLINICAL DATA:  82year old female with fever, altered mental status. Foot wound. Query osteomyelitis. EXAM: LEFT FOOT - COMPLETE 3+ VIEW COMPARISON:  None Available. FINDINGS: Dorsal soft tissue wound overlying the calcaneus. Achilles insertion degenerative spurring at the calcaneus there, mildly discontinuous but no cortical osteolysis to suggest osteomyelitis. No tracking soft tissue gas. Hallux valgus and metatarsus primus varus.  No acute fracture or dislocation identified. Some calcified peripheral vascular disease. IMPRESSION: 1. Dorsal soft tissue wound overlying the calcaneus with no radiographic evidence of Osteomyelitis. 2.  No acute osseous abnormality identified. Electronically Signed   By: HLemmie Evens  [place] Crane M.D.   On: 12/19/2022 04:18   DG Chest Port 1 View  Result Date: 12/19/2022 CLINICAL DATA:  82 year old female with fever, altered mental status. Confusion. EXAM: PORTABLE CHEST 1 VIEW COMPARISON:  Portable chest 11/01/2022 and earlier. FINDINGS: Portable AP semi upright view at 0401 hours. Mildly rotated to the right. Improved lung volumes compared to prior portable exams. Mediastinal contours are within normal limits. Allowing for portable technique the lungs are clear. No pneumothorax or pleural effusion identified. Chronic degenerative and postoperative changes at the shoulders. No acute osseous abnormality identified. Paucity of bowel gas in the visible abdomen. IMPRESSION: No acute cardiopulmonary abnormality. Electronically Signed   By: Genevie Ann M.D.   On: 12/19/2022 04:16      Assessment/Plan Principal Problem:   Severe sepsis (HCC) Active Problems:   UTI (urinary tract infection)   NSTEMI (non-ST elevated myocardial infarction) ([place])   Acute metabolic encephalopathy   Benign essential hypertension   Gout   Chronic diastolic CHF (congestive heart failure) (HCC)   HLD (hyperlipidemia)   Iron deficiency anemia   Acquired hypothyroidism   Type II diabetes mellitus with renal manifestations ([place])   Chronic kidney disease, stage 3a (HCC)   DVT (deep venous thrombosis) (HCC)   Diabetic foot ulcer (HCC)_bialteral heal   Pressure injury of skin_sacrum and right hip   Assessment and Plan:   Severe sepsis due to UTI: Patient has severe sepsis with WBC 21.7, lactic acid 2.8, 2.1.  Patient has been intermittently hypotensive.  Patient is at high risk of requiring vasopressor.  Consulted Dr. Mortimer Fries of ICU  -Admitted  to stepdown as inpatient -IV Rocephin -Blood culture and urine culture -IV fluid: Totally 3 L IV fluid bolus, then 75 cc/h of normal saline -Patient was given 100 mg of Solu-Cortef, 12.5 mg of albumin -Midodrine 10 mg 3 times daily  NSTEMI (non-ST elevated myocardial infarction) ([place]): Troponin level 2280, 2583, 2592.  Consulted with Dr. Daniel Nones of card -Trend troponin -Aspirin -Lipitor -2D echo -IV heparin  Acute metabolic encephalopathy: Possibly due to UTI.  CT head negative for acute intra cranial issues. -Frequent neurocheck -Fall precaution  Benign essential hypertension -IV hydralazine as needed -Hold pressure medications due to hypotension  Gout -Patient is not taking allopurinol currently  Chronic diastolic CHF (congestive heart failure) ([place]): 2D echo on 11/03/2022 showed EF of 55 to 60% with grade 1 diastolic dysfunction.  Patient has elevated BNP 719, at high risk of developing CHF exacerbation.  Due to hypotension, will hold off diuretics.  Currently oxygen saturation 94-100% on room air. -Watch volume status closely.  HLD (hyperlipidemia) -Lipitor  Iron deficiency anemia: Hemoglobin stable 8.9 (8.0 on 04/09/2022. -Follow-up with CBC  Acquired hypothyroidism -Synthroid  Type II diabetes mellitus with renal manifestations ([place]): A1c 5.7 recently, well-controlled.  Patient is taking metformin and Januvia at home -SSI  Chronic kidney disease, stage 3a ([place]): Stable, creatinine 0.98, BUN 20, GFR 59 -Follow-up with BMP  DVT (deep venous thrombosis) (HCC) -s/p of IVC filter  Diabetic foot ulcer (HCC)_bialteral heal: Difficult to rule out possible infection. -Add vancomycin -Patient is on Rocephin as above -Follow-up blood culture  Pressure injury of skin_sacrum and right hip -Wound care consult     DVT ppx: on IV Heparin     Code Status: Full code per her son  Family Communication:  Yes, patient's son by phone  Disposition Plan:  Anticipate  discharge back to previous environment  Consults called: Dr. Mortimer Fries of ICU and Dr. Daniel Nones of card  are consulted.  Admission status and Level of care: Stepdown:     as inpt       Dispo: The patient is from: Home              Anticipated d/c is to: Home              Anticipated d/c date is: 2 days              Patient currently is not medically stable to d/c.    Severity of Illness:  The appropriate patient status for this patient is INPATIENT. Inpatient status is judged to be reasonable and necessary in order to provide the required intensity of service to ensure the patient's safety. The patient's presenting symptoms, physical exam findings, and initial radiographic and laboratory data in the context of their chronic comorbidities is felt to place them at high risk for further clinical deterioration. Furthermore, it is not anticipated that the patient will be medically stable for discharge from the hospital within 2 midnights of admission.   * I certify that at the point of admission it is my clinical judgment that the patient will require inpatient hospital care spanning beyond 2 midnights from the point of admission due to high intensity of service, high risk for further deterioration and high frequency of surveillance required.*       Date of Service 12/19/2022    Ivor [facility]   If 7PM-7AM, please contact night-coverage [email or web address] 12/19/2022, 7:14 PM

## 2022-12-19 NOTE — ED Notes (Signed)
Pt had pulled her purewick out of place. Linens were changed and pt had new depends and purewick placed.

## 2022-12-19 NOTE — Consult Note (Signed)
Cardiology Consultation   Patient ID: CYNAI SKEENS MRN: [id number]; DOB: October 26, 1941  Admit date: 12/19/2022 Date of Consult: 12/19/2022  PCP:  Tracie Harrier, [place] [facility] Cardiologist: New  Patient Profile:   [name] is a 82 y.o. female with a hx of CHF, HTN, HLD, PVD, DVT s/p left IVC on 10/2022, CKD stage 3, kidney stones, diverticulitis, anemia, gout, hypothyroidism who is being seen 12/19/2022 for the evaluation of elevated troponin at the request of Dr. Blaine Hamper.  History of Present Illness:   [name] has not been seen by cardiology in the past. No h/o MI or stenting. She lives with her husband and uses a wheelchair. No alcohol, drug, or tobacco history.   Recently admitted in Late December with severe sepsis from right heal wound, chronic ulcer of great toe, acute on chronic anemia, thrombocytosis, Acute DVT with left femoral vein, acute AMS, acute on chronic urinary retention. The patient underwent debridement. Bone biosy showed no osteomyelitis. He had IVC filter placed. HGb down to 7.4. Abx at discharge. Echo during admission showed LVEF 55-60%, no WMA, LVH, G1DD, normal RVSF, severely dilated LA, moderately dilated RA, mild MR.   The patient presented to the ER at [facility] on 12/19/22 with weakness, fever and AMS. The patient's mental status is improving, however history obtained from chart review.  The patient's son is her caretaker. Initially noted a fever. The son also noted abdominal pain. EMS was called who noted axillary tem 99, HR 110, CO2 20, RR 26. The patient was placed on BIPAP and brought to the ER.   In the ER BP 155/74, pulse 117, RR 25, febrile 102.3, 95% O2. Labs showed WBC 21.7, Hgb 8.9, CO2 18, albumin 2.8. LA 2.8. BNP 719. UA showed UTI. HS trop 7408>1448. CXR non-acute. CT head non-acute. CT abdomen showed no acute abnormality, trace b/l pleural effusions. CXR foot with no osteomyelitis. She was started on IV heparin and  admitted for further work-up.  Past Medical History:  Diagnosis Date   Arthritis    Arthritis    Breast cancer ([place]) 1992   right breast with lumpectomy and rad tx   Cancer of right breast ([place]) 04/16/2013   right breast with mastectomy   Diabetes mellitus without complication ([place])    Gout    High cholesterol    Hyperlipidemia    Hypertension    Personal history of radiation therapy     Past Surgical History:  Procedure Laterality Date   ABDOMINAL HYSTERECTOMY     ANKLE FRACTURE SURGERY Right 2004   BREAST LUMPECTOMY Right 1992   positive   IRRIGATION AND DEBRIDEMENT FOOT Right 11/02/2022   Procedure: IRRIGATION AND DEBRIDEMENT RIGHT FOOT AND BONE BIOPSY RIGHT FOOT;  Surgeon: Samara Deist, DPM;  Location: [facility] ORS;  Service: Podiatry;  Laterality: Right;   IVC FILTER INSERTION N/A 11/06/2022   Procedure: IVC FILTER INSERTION;  Surgeon: Algernon Huxley, MD;  Location: [facility];  Service: Cardiovascular;  Laterality: N/A;   MASTECTOMY Right 2014   PERIPHERAL VASCULAR THROMBECTOMY Left 11/06/2022   Procedure: PERIPHERAL VASCULAR THROMBECTOMY- EXTERNAL Iliac Vein/CFV;  Surgeon: Algernon Huxley, MD;  Location: [facility];  Service: Cardiovascular;  Laterality: Left;   SHOULDER SURGERY Right 2010     Home Medications:  Prior to Admission medications   Medication Sig Start Date End Date Taking? Authorizing Provider  acetaminophen (TYLENOL) 500 MG tablet Take 500-1,000 mg by mouth 4 (four) times  daily as needed for mild pain or moderate pain.   Yes [provider]  allopurinol (ZYLOPRIM) 300 MG tablet Take 300 mg by mouth daily.   Yes [provider]  atenolol (TENORMIN) 50 MG tablet Take 50 mg by mouth daily.   Yes [provider]  atorvastatin (LIPITOR) 40 MG tablet Take 40 mg by mouth daily.    Yes [provider]  diphenoxylate-atropine (LOMOTIL) 2.5-0.025 MG tablet Take 1-2 tablets by mouth 3 (three) times daily as needed.  08/20/22  Yes [provider]  furosemide (LASIX) 40 MG tablet Take 40 mg by mouth daily.   Yes [provider]  gentamicin cream (GARAMYCIN) 0.1 % Apply 1 Application topically in the morning and at bedtime.   Yes [provider]  HYDROcodone-acetaminophen (NORCO) 7.5-325 MG tablet Take 1 tablet by mouth 2 (two) times daily as needed for moderate pain or severe pain.   Yes [provider]  levothyroxine (SYNTHROID) 88 MCG tablet Take 88 mcg by mouth daily.   Yes [provider]  lisinopril (ZESTRIL) 5 MG tablet Take 5 mg by mouth daily. 09/24/22 09/24/23 Yes [provider]  metFORMIN (GLUCOPHAGE) 1000 MG tablet Take 1,000 mg by mouth in the morning and at bedtime.   Yes [provider]  sitaGLIPtin (JANUVIA) 50 MG tablet Take 50 mg by mouth daily.   Yes [provider]  vitamin B-12 (CYANOCOBALAMIN) 1000 MCG tablet Take 1,000 mcg by mouth daily.   Yes [provider]    Inpatient Medications: Scheduled Meds:  acetaminophen  650 mg Rectal Once   [START ON 12/20/2022] aspirin EC  81 mg Oral Daily   insulin aspart  0-5 Units Subcutaneous QHS   insulin aspart  0-9 Units Subcutaneous TID WC   sodium chloride flush  10-40 mL Intracatheter Q12H   Continuous Infusions:  sodium chloride 999 mL (12/19/22 0757)   heparin 650 Units/hr (12/19/22 0717)   PRN Meds: acetaminophen, acetaminophen, hydrALAZINE, ondansetron (ZOFRAN) IV, sodium chloride flush  Allergies:    Allergies  Allergen Reactions   Other Anaphylaxis    Anesthisia has been a health issue for her in the past.    Sulfa Antibiotics Rash    Social History:   Social History   Socioeconomic History   Marital status: Married    Spouse name: Not on file   Number of children: 2   Years of education: Not on file   Highest education level: Not on file  Occupational History   Not on file  Tobacco Use   Smoking status: Never    Passive exposure: Never    Smokeless tobacco: Never  Vaping Use   Vaping Use: Never used  Substance and Sexual Activity   Alcohol use: No    Alcohol/week: 0.0 standard drinks of alcohol   Drug use: No   Sexual activity: Not Currently  Other Topics Concern   Not on file  Social History Narrative   Not on file   Social Determinants of Health   Financial Resource Strain: Not on file  Food Insecurity: No Food Insecurity (11/02/2022)   Hunger Vital Sign    Worried About Running Out of Food in the Last Year: Never true    Ran Out of Food in the Last Year: Never true  Transportation Needs: No Transportation Needs (11/02/2022)   PRAPARE - Hydrologist (Medical): No    Lack of Transportation (Non-Medical): No  Physical Activity: Not on file  Stress: Not on file  Social Connections: Not on file  Intimate Partner Violence: Not At Risk (11/02/2022)   Humiliation, Afraid, Rape, and Kick questionnaire    Fear of Current or Ex-Partner: No    Emotionally Abused: No    Physically Abused: No    Sexually Abused: No    Family History:    Family History  Problem Relation Age of Onset   Diabetes Father    Breast cancer Neg Hx      ROS:  Please see the history of present illness.   All other ROS reviewed and negative.     Physical Exam/Data:   Vitals:   12/19/22 0700 12/19/22 0701 12/19/22 0730 12/19/22 0800  BP: (!) 93/54  96/60 101/68  Pulse: 96  95 94  Resp: (!) '21  17 19  '$ Temp:  99 F (37.2 C)    TempSrc:  Oral    SpO2: 100%  100% 99%  Weight:      Height:        Intake/Output Summary (Last 24 hours) at 12/19/2022 0916 Last data filed at 12/19/2022 0610 Gross per 24 hour  Intake 2700 ml  Output --  Net 2700 ml      12/19/2022    3:38 AM 11/30/2022    2:51 PM 11/08/2022    4:26 PM  Last 3 Weights  Weight (lbs) 109 lb 9.6 oz 114 lb 114 lb 3.2 oz  Weight (kg) 49.714 kg 51.71 kg 51.8 kg     Body mass index is 22.14 kg/m.  General:  Frail elderly female HEENT:  normal Neck: no JVD Vascular: No carotid bruits; Distal pulses 2+ bilaterally Cardiac:  normal S1, S2; RRR; no murmur  Lungs:  clear to auscultation bilaterally, no wheezing, rhonchi or rales  Abd: soft, nontender, no hepatomegaly  Ext: no edema Musculoskeletal:  No deformities, BUE and BLE strength normal and equal; bandages bilateral feet Skin: warm and dry  Neuro:  CNs 2-12 intact, no focal abnormalities noted Psych:  Normal affect   EKG:  The EKG was personally reviewed and demonstrates:  NSR 96bpm, nonspecific T wave changes Telemetry:  Telemetry was personally reviewed and demonstrates:  NSR, PAC/PVCs, HR 90s  Relevant CV Studies:  Echo 2023  1. Left ventricular ejection fraction, by estimation, is 55 to 60%. The  left ventricle has normal function. The left ventricle has no regional  wall motion abnormalities. There is moderate concentric left ventricular  hypertrophy. Left ventricular  diastolic parameters are consistent with Grade I diastolic dysfunction  (impaired relaxation).   2. Right ventricular systolic function is normal. The right ventricular  size is normal.   3. Left atrial size was severely dilated.   4. Right atrial size was moderately dilated.   5. The mitral valve is normal in structure. Mild mitral valve  regurgitation. No evidence of mitral stenosis.   6. The aortic valve is normal in structure. Aortic valve regurgitation is  trivial. No aortic stenosis is present.   7. The inferior vena cava is normal in size with greater than 50%  respiratory variability, suggesting right atrial pressure of 3 mmHg.   Laboratory Data:  High Sensitivity Troponin:   Recent Labs  Lab 12/19/22 0353 12/19/22 0534 12/19/22 0756  TROPONINIHS 2,280* 2,583* 2,592*     Chemistry Recent Labs  Lab 12/19/22 0353  NA 136  K 3.8  CL 108  CO2 18*  GLUCOSE 192*  BUN 20  CREATININE 0.98  CALCIUM 8.1*  GFRNONAA 59*  ANIONGAP 10    Recent Labs  Lab 12/19/22 0353   PROT 7.6  ALBUMIN 2.8*  AST 27  ALT 9  ALKPHOS 64  BILITOT 0.7   Lipids No results for input(s): "CHOL", "TRIG", "HDL", "LABVLDL", "LDLCALC", "CHOLHDL" in the last 168 hours.  Hematology Recent Labs  Lab 12/19/22 0353  WBC 21.7*  RBC 3.76*  HGB 8.9*  HCT 29.8*  MCV 79.3*  MCH 23.7*  MCHC 29.9*  RDW 17.9*  PLT 517*   Thyroid No results for input(s): "TSH", "FREET4" in the last 168 hours.  BNP Recent Labs  Lab 12/19/22 0756  BNP 719.3*    DDimer No results for input(s): "DDIMER" in the last 168 hours.   Radiology/Studies:  CT Head Wo Contrast  Result Date: 12/19/2022 CLINICAL DATA:  82 year old female with altered mental status. Fever. EXAM: CT HEAD WITHOUT CONTRAST TECHNIQUE: Contiguous axial images were obtained from the base of the skull through the vertex without intravenous contrast. RADIATION DOSE REDUCTION: This exam was performed according to the departmental dose-optimization program which includes automated exposure control, adjustment of the mA and/or kV according to patient size and/or use of iterative reconstruction technique. COMPARISON:  None Available. FINDINGS: Brain: No midline shift, ventriculomegaly, mass effect, evidence of mass lesion, intracranial hemorrhage or evidence of cortically based acute infarction. No cortical encephalomalacia identified. Mild to moderate for age scattered bilateral cerebral white matter hypodensity with some deep white matter capsule involvement. Mild heterogeneity in the left basal ganglia. Vascular: Calcified atherosclerosis at the skull base. No suspicious intracranial vascular hyperdensity. Skull: Intact, negative. Sinuses/Orbits: Visualized paranasal sinuses and mastoids are clear. Other: No acute orbit or scalp soft tissue finding. IMPRESSION: 1. No acute intracranial abnormality. 2. Mild to moderate heterogeneity in the cerebral white matter and left basal for age ganglia compatible with small vessel disease. Electronically  Signed   By: Genevie Ann M.D.   On: 12/19/2022 06:32   CT Abdomen Pelvis W Contrast  Result Date: 12/19/2022 CLINICAL DATA:  82 year old female with history of acute onset of nonlocalized abdominal pain. EXAM: CT ABDOMEN AND PELVIS WITH CONTRAST TECHNIQUE: Multidetector CT imaging of the abdomen and pelvis was performed using the standard protocol following bolus administration of intravenous contrast. RADIATION DOSE REDUCTION: This exam was performed according to the departmental dose-optimization program which includes automated exposure control, adjustment of the mA and/or kV according to patient size and/or use of iterative reconstruction technique. CONTRAST:  74m OMNIPAQUE IOHEXOL 300 MG/ML  SOLN COMPARISON:  CT of the abdomen and pelvis 11/01/2022. FINDINGS: Lower chest: Trace bilateral pleural effusions lying dependently. Atherosclerotic calcifications are noted in the descending thoracic aorta. Calcifications of the mitral annulus. Hepatobiliary: No definite suspicious cystic or solid hepatic lesions are noted. No intra or extrahepatic biliary ductal dilatation. Status post cholecystectomy. Pancreas: No pancreatic mass. No pancreatic ductal dilatation. No pancreatic or peripancreatic fluid collections or inflammatory changes. Spleen: Unremarkable. Adrenals/Urinary Tract: Extensive cortical thinning noted in the anterior aspect of the left kidney, presumably scarring from prior infection or infarction. Right kidney and bilateral adrenal glands are otherwise normal in appearance. No hydroureteronephrosis. Urinary bladder is moderately distended, and base of the urinary bladder extends well below the level of the pubococcygeal line at rest, indicative of a cystocele from pelvic floor laxity. Stomach/Bowel: The appearance of the stomach is normal. No pathologic dilatation of small bowel or colon. Numerous colonic diverticuli are noted, without definite focal surrounding inflammatory changes to indicate an acute  diverticulitis at this time. Normal appendix. Vascular/Lymphatic:  Aortic atherosclerosis, without evidence of aneurysm or dissection in the abdominal or pelvic vasculature. IVC filter noted, with tip terminating shortly below the level of the renal veins. No lymphadenopathy noted in the abdomen or pelvis. Reproductive: Status post hysterectomy. Ovaries are not confidently identified may be surgically absent or atrophic. Other: No significant volume of ascites.  No pneumoperitoneum. Musculoskeletal: There are no aggressive appearing lytic or blastic lesions noted in the visualized portions of the skeleton. IMPRESSION: 1. No definite acute findings noted in the abdomen or pelvis to account for the patient's symptoms. 2. Trace bilateral pleural effusions lying dependently. 3. Colonic diverticulosis without evidence of acute diverticulitis at this time. 4. Aortic atherosclerosis. 5. Pelvic floor laxity with cystocele. 6. Additional incidental findings, as above. Electronically Signed   By: Vinnie Langton M.D.   On: 12/19/2022 06:26   DG Foot Complete Right  Result Date: 12/19/2022 CLINICAL DATA:  82 year old female with fever, altered mental status. Foot wound. Query osteomyelitis. EXAM: RIGHT FOOT COMPLETE - 3+ VIEW COMPARISON:  Calcaneus series 11/02/2022. FINDINGS: Bulky chronic degenerative and/or post arthrodesis changes at the right ankle joint. Chronic sclerosis and cannulated screw fragment there appear grossly stable since December. Bulky chronic dorsal calcaneus degenerative spurring with overlying soft tissue deficiency. But no osteolysis to indicate osteomyelitis. No soft tissue gas. No superimposed acute fracture or dislocation identified in the right foot. Hallux valgus and metatarsus primus varus. IMPRESSION: 1. Soft tissue deficiency overlying the dorsal calcaneus but no radiographic evidence of osteomyelitis. 2. Bulky chronic degenerative and/or post arthrodesis changes at the right ankle, with  retained screw fragment. Electronically Signed   By: Genevie Ann M.D.   On: 12/19/2022 04:21   DG Foot Complete Left  Result Date: 12/19/2022 CLINICAL DATA:  82 year old female with fever, altered mental status. Foot wound. Query osteomyelitis. EXAM: LEFT FOOT - COMPLETE 3+ VIEW COMPARISON:  None Available. FINDINGS: Dorsal soft tissue wound overlying the calcaneus. Achilles insertion degenerative spurring at the calcaneus there, mildly discontinuous but no cortical osteolysis to suggest osteomyelitis. No tracking soft tissue gas. Hallux valgus and metatarsus primus varus. No acute fracture or dislocation identified. Some calcified peripheral vascular disease. IMPRESSION: 1. Dorsal soft tissue wound overlying the calcaneus with no radiographic evidence of Osteomyelitis. 2.  No acute osseous abnormality identified. Electronically Signed   By: Genevie Ann M.D.   On: 12/19/2022 04:18   DG Chest Port 1 View  Result Date: 12/19/2022 CLINICAL DATA:  82 year old female with fever, altered mental status. Confusion. EXAM: PORTABLE CHEST 1 VIEW COMPARISON:  Portable chest 11/01/2022 and earlier. FINDINGS: Portable AP semi upright view at 0401 hours. Mildly rotated to the right. Improved lung volumes compared to prior portable exams. Mediastinal contours are within normal limits. Allowing for portable technique the lungs are clear. No pneumothorax or pleural effusion identified. Chronic degenerative and postoperative changes at the shoulders. No acute osseous abnormality identified. Paucity of bowel gas in the visible abdomen. IMPRESSION: No acute cardiopulmonary abnormality. Electronically Signed   By: Genevie Ann M.D.   On: 12/19/2022 04:16     Assessment and Plan:   Elevated troponin - HS troponin elevated >2000 in the setting of severe sepsis started on IV heparin - continue to trend troponin - recent echo showed normal LVEF. Repeat limited echo has been ordered - no chest pain reported - started on ASA '81mg'$  daily -  PTA atenolol held for hypotension - PTA Lipitor continued - continue medical management with IV heparin for 48 hours  Severe sepsis  2/2 UTI AMS - sepsis 2/2 UTI, she had a recent admission with sepsis from foot infection - CXR foot with no osteomyelitis - CT head non-acute - LA improving - BP soft - BC pending - per IM  HTN - PTA atenolol, lisinopril held - BP soft  Chronic diastolic heart failure - euvolemic on exam - PTA lasix '40mg'$  daily, atenolol '50mg'$  daily and lisinopril 5 mg daily - repeat limited echo ordered as above - BNP elevated to 700s, caution with IVF. Can restart lasix  HLD - PTA Lipitor '40mg'$  continued - LDL 88 03/2022. Can increase Lipitor  For questions or updates, please contact [place] Please consult [email or web address] for contact info under    Signed, Rosmery Duggin Ninfa Meeker, PA-C  12/19/2022 9:16 AM

## 2022-12-19 NOTE — ED Notes (Signed)
Patient transported to CT 

## 2022-12-19 NOTE — Progress Notes (Signed)
[place] for heparin infusion Indication: ACS/STEMI  Allergies  Allergen Reactions   Other Anaphylaxis    Anesthisia has been a health issue for her in the past.    Sulfa Antibiotics Rash    Patient Measurements: Height: '4\' 11"'$  (149.9 cm) Weight: 49.7 kg (109 lb 9.6 oz) IBW/kg (Calculated) : 43.2 Heparin Dosing Weight: 49.7 kg  Vital Signs: Temp: 102.3 F (39.1 C) (02/07 0345) Temp Source: Rectal (02/07 0345) BP: 94/63 (02/07 0600) Pulse Rate: 96 (02/07 0600)  Labs: Recent Labs    12/19/22 0353 12/19/22 0534  HGB 8.9*  --   HCT 29.8*  --   PLT 517*  --   CREATININE 0.98  --   TROPONINIHS 2,280* 2,583*    Estimated Creatinine Clearance: 31.7 mL/min (by C-G formula based on SCr of 0.98 mg/dL).   Medical History: Past Medical History:  Diagnosis Date   Arthritis    Arthritis    Breast cancer ([place]) 1992   right breast with lumpectomy and rad tx   Cancer of right breast ([place]) 04/16/2013   right breast with mastectomy   Diabetes mellitus without complication ([facility])    Gout    High cholesterol    Hyperlipidemia    Hypertension    Personal history of radiation therapy     Assessment: Pt is a 82 yo female presenting to ED due to fever, confusion, and abdominal pain, found with elevated troponin I level, trending up.  Goal of Therapy:  Heparin level 0.3-0.7 units/ml Monitor platelets by anticoagulation protocol: Yes   Plan:  Bolus 3000 units x 1 Start heparin infusion at 650 units/hr Will check HL in 8 hr after start of infusion CBC daily while on heparin  Renda Rolls, PharmD, [facility] 12/19/2022 6:45 AM

## 2022-12-19 NOTE — ED Notes (Signed)
IV team placing midline

## 2022-12-19 NOTE — ED Notes (Signed)
Pt on bedpan.

## 2022-12-19 NOTE — ED Notes (Signed)
3 attempts made at IV placement without success

## 2022-12-19 NOTE — ED Notes (Signed)
IV team at bedside to place second PIV

## 2022-12-19 NOTE — ED Notes (Signed)
IVF removed from pressure bag and placed on pump to promote faster infusion.

## 2022-12-20 ENCOUNTER — Inpatient Hospital Stay (HOSPITAL_COMMUNITY)
Admit: 2022-12-20 | Discharge: 2022-12-20 | Disposition: A | Discharge status: Home / Self Care | Payer: PPO | Attending: Cardiology | Admitting: Cardiology

## 2022-12-20 ENCOUNTER — Encounter: Payer: Self-pay | Admitting: Internal Medicine

## 2022-12-20 DIAGNOSIS — E1122 Type 2 diabetes mellitus with diabetic chronic kidney disease: Secondary | ICD-10-CM | POA: Diagnosis not present

## 2022-12-20 DIAGNOSIS — N1831 Chronic kidney disease, stage 3a: Secondary | ICD-10-CM | POA: Diagnosis not present

## 2022-12-20 DIAGNOSIS — I214 Non-ST elevation (NSTEMI) myocardial infarction: Secondary | ICD-10-CM

## 2022-12-20 DIAGNOSIS — A419 Sepsis, unspecified organism: Secondary | ICD-10-CM | POA: Diagnosis not present

## 2022-12-20 DIAGNOSIS — D72829 Elevated white blood cell count, unspecified: Secondary | ICD-10-CM

## 2022-12-20 LAB — CBG MONITORING, ED
Glucose-Capillary: 91 mg/dL (ref 70–99)
Glucose-Capillary: 105 mg/dL — ABNORMAL HIGH (ref 70–99)

## 2022-12-20 LAB — CBC
HCT: 24 % — ABNORMAL LOW (ref 36.0–46.0)
Hemoglobin: 7.3 g/dL — ABNORMAL LOW (ref 12.0–15.0)
MCH: 24.3 pg — ABNORMAL LOW (ref 26.0–34.0)
MCHC: 30.4 g/dL (ref 30.0–36.0)
MCV: 80 fL (ref 80.0–100.0)
Platelets: 410 10*3/uL — ABNORMAL HIGH (ref 150–400)
RBC: 3 MIL/uL — ABNORMAL LOW (ref 3.87–5.11)
RDW: 18 % — ABNORMAL HIGH (ref 11.5–15.5)
WBC: 15.4 10*3/uL — ABNORMAL HIGH (ref 4.0–10.5)
nRBC: 0 % (ref 0.0–0.2)

## 2022-12-20 LAB — SEDIMENTATION RATE: Sed Rate: 107 mm/hr — ABNORMAL HIGH (ref 0–30)

## 2022-12-20 LAB — HEPARIN LEVEL (UNFRACTIONATED)
Heparin Unfractionated: 0.15 IU/mL — ABNORMAL LOW (ref 0.30–0.70)
Heparin Unfractionated: 0.17 IU/mL — ABNORMAL LOW (ref 0.30–0.70)
Heparin Unfractionated: 0.25 IU/mL — ABNORMAL LOW (ref 0.30–0.70)

## 2022-12-20 LAB — ECHOCARDIOGRAM LIMITED
Height: 59 in
S' Lateral: 2.4 cm
Weight: 1753.6 oz

## 2022-12-20 LAB — LIPID PANEL
Cholesterol: 136 mg/dL (ref 0–200)
HDL: 22 mg/dL — ABNORMAL LOW (ref >40)
LDL Cholesterol: 85 mg/dL (ref 0–99)
Total CHOL/HDL Ratio: 6.2 RATIO
Triglycerides: 143 mg/dL (ref <150)
VLDL: 29 mg/dL (ref 0–40)

## 2022-12-20 LAB — BASIC METABOLIC PANEL
Anion gap: 7 (ref 5–15)
BUN: 28 mg/dL — ABNORMAL HIGH (ref 8–23)
CO2: 18 mmol/L — ABNORMAL LOW (ref 22–32)
Calcium: 7.5 mg/dL — ABNORMAL LOW (ref 8.9–10.3)
Chloride: 113 mmol/L — ABNORMAL HIGH (ref 98–111)
Creatinine, Ser: 0.83 mg/dL (ref 0.44–1.00)
GFR, Estimated: >60 mL/min (ref >60)
Glucose, Bld: 112 mg/dL — ABNORMAL HIGH (ref 70–99)
Potassium: 3.6 mmol/L (ref 3.5–5.1)
Sodium: 138 mmol/L (ref 135–145)

## 2022-12-20 LAB — MRSA NEXT GEN BY PCR, NASAL: MRSA by PCR Next Gen: NOT DETECTED

## 2022-12-20 LAB — C-REACTIVE PROTEIN: CRP: 20.3 mg/dL — ABNORMAL HIGH (ref <1.0)

## 2022-12-20 LAB — PREPARE RBC (CROSSMATCH)

## 2022-12-20 LAB — GLUCOSE, CAPILLARY: Glucose-Capillary: 151 mg/dL — ABNORMAL HIGH (ref 70–99)

## 2022-12-20 MED ORDER — HEPARIN BOLUS VIA INFUSION
1500.0000 [IU] | Freq: Once | INTRAVENOUS | Status: AC
  Administered 2022-12-20: 1500 [IU] via INTRAVENOUS
  Filled 2022-12-20: qty 1500

## 2022-12-20 MED ORDER — HEPARIN BOLUS VIA INFUSION
2000.0000 [IU] | Freq: Once | INTRAVENOUS | Status: AC
  Administered 2022-12-20: 2000 [IU] via INTRAVENOUS
  Filled 2022-12-20: qty 2000

## 2022-12-20 MED ORDER — HEPARIN BOLUS VIA INFUSION
750.0000 [IU] | Freq: Once | INTRAVENOUS | Status: AC
  Administered 2022-12-20: 750 [IU] via INTRAVENOUS
  Filled 2022-12-20: qty 750

## 2022-12-20 MED ORDER — CHLORHEXIDINE GLUCONATE CLOTH 2 % EX PADS
6.0000 | MEDICATED_PAD | Freq: Every day | CUTANEOUS | Status: DC
  Administered 2022-12-20 – 2022-12-24 (×5): 6 via TOPICAL
  Filled 2022-12-20: qty 6

## 2022-12-20 MED ORDER — VANCOMYCIN HCL IN DEXTROSE 1-5 GM/200ML-% IV SOLN
1000.0000 mg | INTRAVENOUS | Status: DC
  Administered 2022-12-20 – 2022-12-23 (×3): 1000 mg via INTRAVENOUS
  Filled 2022-12-20 (×3): qty 200

## 2022-12-20 MED ORDER — SODIUM CHLORIDE 0.9% IV SOLUTION: Freq: Once | INTRAVENOUS | Status: AC

## 2022-12-20 NOTE — Progress Notes (Signed)
Rounding Note    Patient Name: [name] Date of Encounter: 12/20/2022  [facility] Cardiologist:New  Subjective   The patient denies chest pain. Echo pending.  Inpatient Medications    Scheduled Meds:  acetaminophen  650 mg Rectal Once   aspirin EC  81 mg Oral Daily   atorvastatin  40 mg Oral Daily   insulin aspart  0-5 Units Subcutaneous QHS   insulin aspart  0-9 Units Subcutaneous TID WC   leptospermum manuka honey  1 Application Topical Daily   levothyroxine  88 mcg Oral Daily   midodrine  10 mg Oral TID WC   sodium chloride flush  10-40 mL Intracatheter Q12H   Continuous Infusions:  sodium chloride 75 mL/hr at 12/20/22 0513   cefTRIAXone (ROCEPHIN)  IV Stopped (12/19/22 1220)   heparin 950 Units/hr (12/20/22 0132)   vancomycin     PRN Meds: acetaminophen, acetaminophen, diphenoxylate-atropine, hydrALAZINE, HYDROcodone-acetaminophen, ondansetron (ZOFRAN) IV, sodium chloride flush   Vital Signs    Vitals:   12/20/22 0300 12/20/22 0400 12/20/22 0500 12/20/22 0600  BP: 118/64 119/77 103/62 120/78  Pulse: 83 84 79 83  Resp: (!) 21 (!) 21 (!) 21 18  Temp:    98.1 F (36.7 C)  TempSrc:    Oral  SpO2: 99% 100% 100% 100%  Weight:      Height:        Intake/Output Summary (Last 24 hours) at 12/20/2022 0800 Last data filed at 12/20/2022 0513 Gross per 24 hour  Intake 1169.15 ml  Output --  Net 1169.15 ml      12/19/2022    3:38 AM 11/30/2022    2:51 PM 11/08/2022    4:26 PM  Last 3 Weights  Weight (lbs) 109 lb 9.6 oz 114 lb 114 lb 3.2 oz  Weight (kg) 49.714 kg 51.71 kg 51.8 kg      Telemetry    NSR, PACs, PVCs, Hr 60s - Personally Reviewed  ECG    No new - Personally Reviewed  Physical Exam   GEN: No acute distress.   Neck: No JVD Cardiac: RRR, no murmurs, rubs, or gallops.  Respiratory: Clear to auscultation bilaterally. GI: Soft, nontender, non-distended  MS: No edema; No deformity. Neuro:  Nonfocal  Psych: Normal affect    Labs    High Sensitivity Troponin:   Recent Labs  Lab 12/19/22 0353 12/19/22 0534 12/19/22 0756 12/19/22 1523 12/19/22 1911  TROPONINIHS 2,280* 2,583* 2,592* 1,991* 1,515*     Chemistry Recent Labs  Lab 12/19/22 0353 12/20/22 0516  NA 136 138  K 3.8 3.6  CL 108 113*  CO2 18* 18*  GLUCOSE 192* 112*  BUN 20 28*  CREATININE 0.98 0.83  CALCIUM 8.1* 7.5*  PROT 7.6  --   ALBUMIN 2.8*  --   AST 27  --   ALT 9  --   ALKPHOS 64  --   BILITOT 0.7  --   GFRNONAA 59* >60  ANIONGAP 10 7    Lipids  Recent Labs  Lab 12/20/22 0516  CHOL 136  TRIG 143  HDL 22*  LDLCALC 85  CHOLHDL 6.2    Hematology Recent Labs  Lab 12/19/22 0353 12/20/22 0516  WBC 21.7* 15.4*  RBC 3.76* 3.00*  HGB 8.9* 7.3*  HCT 29.8* 24.0*  MCV 79.3* 80.0  MCH 23.7* 24.3*  MCHC 29.9* 30.4  RDW 17.9* 18.0*  PLT 517* 410*   Thyroid No results for input(s): "TSH", "FREET4" in the last 168  hours.  BNP Recent Labs  Lab 12/19/22 0756  BNP 719.3*    DDimer No results for input(s): "DDIMER" in the last 168 hours.   Radiology    CT Head Wo Contrast  Result Date: 12/19/2022 CLINICAL DATA:  82 year old female with altered mental status. Fever. EXAM: CT HEAD WITHOUT CONTRAST TECHNIQUE: Contiguous axial images were obtained from the base of the skull through the vertex without intravenous contrast. RADIATION DOSE REDUCTION: This exam was performed according to the departmental dose-optimization program which includes automated exposure control, adjustment of the mA and/or kV according to patient size and/or use of iterative reconstruction technique. COMPARISON:  None Available. FINDINGS: Brain: No midline shift, ventriculomegaly, mass effect, evidence of mass lesion, intracranial hemorrhage or evidence of cortically based acute infarction. No cortical encephalomalacia identified. Mild to moderate for age scattered bilateral cerebral white matter hypodensity with some deep white matter capsule  involvement. Mild heterogeneity in the left basal ganglia. Vascular: Calcified atherosclerosis at the skull base. No suspicious intracranial vascular hyperdensity. Skull: Intact, negative. Sinuses/Orbits: Visualized paranasal sinuses and mastoids are clear. Other: No acute orbit or scalp soft tissue finding. IMPRESSION: 1. No acute intracranial abnormality. 2. Mild to moderate heterogeneity in the cerebral white matter and left basal for age ganglia compatible with small vessel disease. Electronically Signed   By: Genevie Ann M.D.   On: 12/19/2022 06:32   CT Abdomen Pelvis W Contrast  Result Date: 12/19/2022 CLINICAL DATA:  82 year old female with history of acute onset of nonlocalized abdominal pain. EXAM: CT ABDOMEN AND PELVIS WITH CONTRAST TECHNIQUE: Multidetector CT imaging of the abdomen and pelvis was performed using the standard protocol following bolus administration of intravenous contrast. RADIATION DOSE REDUCTION: This exam was performed according to the departmental dose-optimization program which includes automated exposure control, adjustment of the mA and/or kV according to patient size and/or use of iterative reconstruction technique. CONTRAST:  61m OMNIPAQUE IOHEXOL 300 MG/ML  SOLN COMPARISON:  CT of the abdomen and pelvis 11/01/2022. FINDINGS: Lower chest: Trace bilateral pleural effusions lying dependently. Atherosclerotic calcifications are noted in the descending thoracic aorta. Calcifications of the mitral annulus. Hepatobiliary: No definite suspicious cystic or solid hepatic lesions are noted. No intra or extrahepatic biliary ductal dilatation. Status post cholecystectomy. Pancreas: No pancreatic mass. No pancreatic ductal dilatation. No pancreatic or peripancreatic fluid collections or inflammatory changes. Spleen: Unremarkable. Adrenals/Urinary Tract: Extensive cortical thinning noted in the anterior aspect of the left kidney, presumably scarring from prior infection or infarction. Right  kidney and bilateral adrenal glands are otherwise normal in appearance. No hydroureteronephrosis. Urinary bladder is moderately distended, and base of the urinary bladder extends well below the level of the pubococcygeal line at rest, indicative of a cystocele from pelvic floor laxity. Stomach/Bowel: The appearance of the stomach is normal. No pathologic dilatation of small bowel or colon. Numerous colonic diverticuli are noted, without definite focal surrounding inflammatory changes to indicate an acute diverticulitis at this time. Normal appendix. Vascular/Lymphatic: Aortic atherosclerosis, without evidence of aneurysm or dissection in the abdominal or pelvic vasculature. IVC filter noted, with tip terminating shortly below the level of the renal veins. No lymphadenopathy noted in the abdomen or pelvis. Reproductive: Status post hysterectomy. Ovaries are not confidently identified may be surgically absent or atrophic. Other: No significant volume of ascites.  No pneumoperitoneum. Musculoskeletal: There are no aggressive appearing lytic or blastic lesions noted in the visualized portions of the skeleton. IMPRESSION: 1. No definite acute findings noted in the abdomen or pelvis to account for the  patient's symptoms. 2. Trace bilateral pleural effusions lying dependently. 3. Colonic diverticulosis without evidence of acute diverticulitis at this time. 4. Aortic atherosclerosis. 5. Pelvic floor laxity with cystocele. 6. Additional incidental findings, as above. Electronically Signed   By: Vinnie Langton M.D.   On: 12/19/2022 06:26   DG Foot Complete Right  Result Date: 12/19/2022 CLINICAL DATA:  82 year old female with fever, altered mental status. Foot wound. Query osteomyelitis. EXAM: RIGHT FOOT COMPLETE - 3+ VIEW COMPARISON:  Calcaneus series 11/02/2022. FINDINGS: Bulky chronic degenerative and/or post arthrodesis changes at the right ankle joint. Chronic sclerosis and cannulated screw fragment there appear  grossly stable since December. Bulky chronic dorsal calcaneus degenerative spurring with overlying soft tissue deficiency. But no osteolysis to indicate osteomyelitis. No soft tissue gas. No superimposed acute fracture or dislocation identified in the right foot. Hallux valgus and metatarsus primus varus. IMPRESSION: 1. Soft tissue deficiency overlying the dorsal calcaneus but no radiographic evidence of osteomyelitis. 2. Bulky chronic degenerative and/or post arthrodesis changes at the right ankle, with retained screw fragment. Electronically Signed   By: Genevie Ann M.D.   On: 12/19/2022 04:21   DG Foot Complete Left  Result Date: 12/19/2022 CLINICAL DATA:  82 year old female with fever, altered mental status. Foot wound. Query osteomyelitis. EXAM: LEFT FOOT - COMPLETE 3+ VIEW COMPARISON:  None Available. FINDINGS: Dorsal soft tissue wound overlying the calcaneus. Achilles insertion degenerative spurring at the calcaneus there, mildly discontinuous but no cortical osteolysis to suggest osteomyelitis. No tracking soft tissue gas. Hallux valgus and metatarsus primus varus. No acute fracture or dislocation identified. Some calcified peripheral vascular disease. IMPRESSION: 1. Dorsal soft tissue wound overlying the calcaneus with no radiographic evidence of Osteomyelitis. 2.  No acute osseous abnormality identified. Electronically Signed   By: Genevie Ann M.D.   On: 12/19/2022 04:18   DG Chest Port 1 View  Result Date: 12/19/2022 CLINICAL DATA:  82 year old female with fever, altered mental status. Confusion. EXAM: PORTABLE CHEST 1 VIEW COMPARISON:  Portable chest 11/01/2022 and earlier. FINDINGS: Portable AP semi upright view at 0401 hours. Mildly rotated to the right. Improved lung volumes compared to prior portable exams. Mediastinal contours are within normal limits. Allowing for portable technique the lungs are clear. No pneumothorax or pleural effusion identified. Chronic degenerative and postoperative changes at  the shoulders. No acute osseous abnormality identified. Paucity of bowel gas in the visible abdomen. IMPRESSION: No acute cardiopulmonary abnormality. Electronically Signed   By: Genevie Ann M.D.   On: 12/19/2022 04:16    Cardiac Studies   Echo 2023  1. Left ventricular ejection fraction, by estimation, is 55 to 60%. The  left ventricle has normal function. The left ventricle has no regional  wall motion abnormalities. There is moderate concentric left ventricular  hypertrophy. Left ventricular  diastolic parameters are consistent with Grade I diastolic dysfunction  (impaired relaxation).   2. Right ventricular systolic function is normal. The right ventricular  size is normal.   3. Left atrial size was severely dilated.   4. Right atrial size was moderately dilated.   5. The mitral valve is normal in structure. Mild mitral valve  regurgitation. No evidence of mitral stenosis.   6. The aortic valve is normal in structure. Aortic valve regurgitation is  trivial. No aortic stenosis is present.   7. The inferior vena cava is normal in size with greater than 50%  respiratory variability, suggesting right atrial pressure of 3 mmHg.   Patient Profile     82 y.o.  female with a hx of CHF, HTN, HLD, PVD, DVT s/p left IVC on 10/2022, CKD stage 3, kidney stones, diverticulitis, anemia, gout, hypothyroidism who is being seen 12/19/2022 for the evaluation of elevated troponin   Assessment & Plan    Elevated troponin - HS troponin elevated >2000 in the setting of severe sepsis started on IV heparin - continue to trend troponin - recent echo showed normal LVEF. Repeat limited echo has been ordered - no chest pain reported - started on ASA '81mg'$  daily - PTA atenolol held for hypotension - PTA Lipitor continued - continue medical management with IV heparin for 48 hours. Further work-up pending echo   Septic shock 2/2 UTI AMS - sepsis 2/2 UTI, she had a recent admission with sepsis from foot  infection - CXR foot with no osteomyelitis - CT head non-acute - LA improving - BP soft, requiring midodrine, IVF and prn levophed to maintain MAP>65 - BC pending - abx per IM, CCM is following   HTN - PTA atenolol, lisinopril held - BP soft, on midodrine '10mg'$  TID   Chronic diastolic heart failure - euvolemic on exam - PTA lasix '40mg'$  daily, atenolol '50mg'$  daily and lisinopril 5 mg daily - repeat limited echo ordered as above - BNP elevated to 700s, caution with IVF. Can restart lasix   HLD - PTA Lipitor '40mg'$  continued - LDL 88 03/2022. Can increase Lipitor  For questions or updates, please contact [place] Please consult [email or web address] for contact info under        Signed, Ammie Warrick Ninfa Meeker, PA-C  12/20/2022, 8:00 AM

## 2022-12-20 NOTE — ED Notes (Addendum)
Patient's husband voices concerns to nurse related to pateint's low Hbg and the heparin dose being administered. Patient Hbg is 7.2, patient heparin is currently running at 1100u/hr. Dr. Rockey Situ was contacted by this nurse to make him aware of husbands concerns. Dr. Rockey Situ responds to chat, "looks chronically low. likely dilutional , anterior wall down on echo so would be nice to keep heparin going for 24 hrs more"

## 2022-12-20 NOTE — ED Notes (Signed)
Cards PA at bedside. 

## 2022-12-20 NOTE — ED Notes (Signed)
Patient husband requests to see Dr. Rockey Situ regarding patient care and concerns. Dr. Rockey Situ notified

## 2022-12-20 NOTE — Progress Notes (Signed)
*  PRELIMINARY RESULTS* Echocardiogram Limited 2D Echocardiogram has been performed.  [name] 12/20/2022, 12:43 PM

## 2022-12-20 NOTE — ED Notes (Signed)
Dr. Rockey Situ in room to speak with patient and family.

## 2022-12-20 NOTE — ED Notes (Signed)
Patient arrives from room 04, patient aox1, patient husband is at bedside. Patient connected to cardiac, spo2, and BP monitors. Patient denies any needs at this time. Call bell in reach, bed in lowest locked position.

## 2022-12-20 NOTE — Consult Note (Signed)
Pharmacy Antibiotic Note  [name] is a 82 y.o. female with PMH including CHF, HTN, HLD, PVD, DVT s/p IVC filter, CKD, kidney stones, diverticulitis, anemia, gout, hypothyroidism admitted on 12/19/2022 with sepsis / elevated troponin. Source of infection thought to be urine versus bilateral heel wounds. Pharmacy has been consulted for vancomycin dosing.Patient is also ordered ceftriaxone.  Plan: Continue ceftriaxone 1g daily.   Vancomycin 1 g IV LD followed by maintenance regimen of vancomycin 750 mg IV q36h. Will adjust dose to 1000 mg q36H for a AUC of 524 due to improvement in Scr. Goal AUC of 400-600. Plan for vancomycin level after 3th or 4th dose.   Height: '4\' 11"'$  (149.9 cm) Weight: 49.7 kg (109 lb 9.6 oz) IBW/kg (Calculated) : 43.2  Temp (24hrs), Avg:98.4 F (36.9 C), Min:97.8 F (36.6 C), Max:98.9 F (37.2 C)  Recent Labs  Lab 12/19/22 0353 12/19/22 0534 12/20/22 0516  WBC 21.7*  --  15.4*  CREATININE 0.98  --  0.83  LATICACIDVEN 2.8* 2.1*  --      Estimated Creatinine Clearance: 37.5 mL/min (by C-G formula based on SCr of 0.83 mg/dL).    Allergies  Allergen Reactions   Other Anaphylaxis    Anesthisia has been a health issue for her in the past.    Sulfa Antibiotics Rash    Antimicrobials this admission: Cefepime 2/7 x 1 Metronidazole 2/7 x 1 Vancomycin 2/7 >>  Ceftriaxone 2/7 >>   Dose adjustments this admission: N/A  Microbiology results: 2/7 BCx: pending 2/7 UCx: <10,000 COLONIES/mL INSIGNIFICANT GROWTH  2/7 WoundCx:   FEW GRAM NEGATIVE RODS    Thank you for allowing pharmacy to be a part of this patient's care.  Oswald Hillock, PharmD, BCPS 12/20/2022 11:04 AM

## 2022-12-20 NOTE — ED Notes (Signed)
Pt's HGB dropped from 8.9 to 7.3. The pt's family states that this happened the last time she was on heparin as well. They wanted me to let the provider know. Message sent to Neomia Glass NP.

## 2022-12-20 NOTE — ED Notes (Signed)
MD at bedside. 

## 2022-12-20 NOTE — Progress Notes (Signed)
PROGRESS NOTE    MICHOL EMORY  PNT:[id number] DOB: 1941-09-11 DOA: 12/19/2022 PCP: Tracie Harrier, MD    Brief Narrative:  This 82 y.o. female with medical history significant of dCHF, HTH, HLD, PVD, DVT (s/p left IVC on 11/06/22), CKD-3a, kidney stone, diverticulitis, anemia, gout, hypothyroidism, chronic pressure ulcer in right hip, sacral ulcer, chronic ulcer in both heels of feet, who presents with AMS, fever.    Patient was recently hospitalized from 12/21 - 12/29 due to severe sepsis secondary to foot ulcer with infection.  Pt was seen by Dr. Vickki Muff of podiatry. Pt had left foot ulcer debridement and biopsy of bone of right heel was negative for osteomyelitis. Patient was also found to have acute left DVT. Pt has left lower extremity thrombectomy and IVC filter placement.    Per her son (I called her son by phone), pt has been confused in the past several days.  Patient has fever and chills.  Her body temperature is 102.3 in ED.  Does not have cough, shortness breath, respiratory distress.  Per report, patient seems to have some abdominal pain but cannot give detailed information to characterize her abdominal pain.  No active nausea, vomiting, diarrhea noted.  Not sure if patient has symptoms of UTI.  She moves all extremities.   Patient has been intermittently hypotension in the emergency room.  Initially patient had blood pressure 89/64, which improved to 94/63 after giving 2.5 L normal saline, then dropped to 70/52.  Patient was given 100 mg of Solu-Cortef, 12.5 g of albumin and started midodrine 10 mg 3 times daily.  Blood pressure improved to 100/71. Workup reveals  WBC 21K Trop 2280>2583>2592, UA+, CXR negative. CT of abdomen/pelvis is negative for acute intra-abdominal issues, negative for hydronephrosis.  CT of head is negative for acute intracranial issue.  X-ray of left foot and right foot negative for bony fracture, no evidence of osteomyelitis.     Assessment & Plan:    Principal Problem:   Sepsis ([place]) Active Problems:   Lower urinary tract infectious disease   NSTEMI (non-ST elevated myocardial infarction) ([place])   Acute metabolic encephalopathy   Benign essential hypertension   Gout   Chronic diastolic CHF (congestive heart failure) (HCC)   HLD (hyperlipidemia)   Iron deficiency anemia   Acquired hypothyroidism   Type II diabetes mellitus with renal manifestations (HCC)   Chronic kidney disease, stage 3a (HCC)   DVT (deep venous thrombosis) (HCC)   Diabetic foot ulcer (HCC)_bialteral heal   Pressure injury of skin_sacrum and right hip   Anemia due to stage 3a chronic kidney disease (HCC)   Leukocytosis   Severe sepsis due to UTI:  Patient met  severe sepsis with WBC 21.7, lactic acid 2.8, 2.1.   Patient has been intermittently hypotensive.   Patient is at high risk of requiring vasopressor.  Admitted to stepdown as inpatient Initiated on IV Rocephin Follow up Blood culture and urine culture Continue IV fluid resuscitation : Totally 3 L IV fluid bolus, then 75 cc/h of normal saline Patient was given 100 mg of Solu-Cortef, 12.5 mg of albumin Continue Midodrine 10 mg 3 times daily   NSTEMI: Troponin level 2280, 2583, 2592.  Consulted with Dr. Daniel Nones of card Continue to trend troponin. Continue Aspirin, Lipitor, IV heparin Obtain 2D echo   Acute metabolic encephalopathy:  Possibly due to UTI.  CT head negative for acute intra cranial issues. -Frequent neurocheck -Fall precaution. Mentation back to baseline.  Likely secondary to UTI  Benign essential hypertension Continue IV hydralazine as needed Hold pressure medications due to hypotension   Gout Patient is not taking allopurinol currently   Chronic diastolic CHF:  2D echo on 11/03/2022 showed EF of 55 to 60% with grade 1 diastolic dysfunction.   Patient has elevated BNP 719, at high risk of developing CHF exacerbation.   Due to hypotension, will hold off diuretics.   Currently oxygen saturation 94-100% on room air. -Watch volume status closely.   HLD (hyperlipidemia) Continue Lipitor   Iron deficiency anemia:  Hemoglobin stable 8.9 (8.0 on 04/09/2022. Follow-up with CBC   Acquired hypothyroidism -Synthroid   Type 2 diabetes mellitus:  Hb A1c 5.7 recently, well-controlled.   Patient is taking metformin and Januvia at home Continue SSI   CKD Stage IIIa: stable, creatinine 0.98, BUN 20, GFR 59 -Follow-up with BMP   DVT (deep venous thrombosis) (HCC) -s/p of IVC filter   Diabetic foot ulcer (HCC)_bialteral heal:  Difficult to rule out possible infection. Added vancomycin for better gram-positive coverage. -Patient is on Rocephin as above -Follow-up blood culture   Pressure injury of skin_sacrum and right hip -Wound care consult   DVT prophylaxis: Heparin IV Code Status: Full code Family Communication: No family at bed side Disposition Plan:   Status is: Inpatient Remains inpatient appropriate because: Admitted for sepsis secondary to UTI,  also found to have elevated troponins,  cardiology is consulted.  Started on IV heparin.    Consultants:  Cardiology  Procedures:None  Antimicrobials:  Anti-infectives (From admission, onward)    Start     Dose/Rate Route Frequency Ordered Stop   12/20/22 1800  vancomycin (VANCOREADY) IVPB 750 mg/150 mL  Status:  Discontinued        750 mg 150 mL/hr over 60 Minutes Intravenous Every 36 hours 12/19/22 1122 12/20/22 1107   12/20/22 1800  vancomycin (VANCOCIN) IVPB 1000 mg/200 mL premix        1,000 mg 200 mL/hr over 60 Minutes Intravenous Every 36 hours 12/20/22 1107     12/19/22 1200  cefTRIAXone (ROCEPHIN) 1 g in sodium chloride 0.9 % 100 mL IVPB        1 g 200 mL/hr over 30 Minutes Intravenous Every 24 hours 12/19/22 1109     12/19/22 0400  ceFEPIme (MAXIPIME) 2 g in sodium chloride 0.9 % 100 mL IVPB        2 g 200 mL/hr over 30 Minutes Intravenous  Once 12/19/22 0350 12/19/22 0459    12/19/22 0400  metroNIDAZOLE (FLAGYL) IVPB 500 mg        500 mg 100 mL/hr over 60 Minutes Intravenous  Once 12/19/22 0350 12/19/22 0610   12/19/22 0400  vancomycin (VANCOCIN) IVPB 1000 mg/200 mL premix        1,000 mg 200 mL/hr over 60 Minutes Intravenous  Once 12/19/22 0350 12/19/22 0703      Subjective: Patient was seen and examined at bedside.  Overnight events noted.   Patient reports doing much better.  Patient reports mental status back to baseline,  he still reports having burning management.  Objective: Vitals:   12/20/22 0800 12/20/22 1048 12/20/22 1110 12/20/22 1400  BP: 119/73  122/83 99/60  Pulse: 80 85 85 73  Resp: 18 (!) 25 (!) 24 16  Temp:  98.5 F (36.9 C)    TempSrc:      SpO2: 100% 100% 100% 100%  Weight:      Height:        Intake/Output Summary (Last 24  hours) at 12/20/2022 1500 Last data filed at 12/20/2022 0513 Gross per 24 hour  Intake 1169.15 ml  Output --  Net 1169.15 ml   Filed Weights   12/19/22 0338  Weight: 49.7 kg    Examination:  General exam: Appears calm and comfortable, deconditioned , not in any distress. Respiratory system: Clear to auscultation. Respiratory effort normal.RR 15 Cardiovascular system: S1 & S2 heard, regular rate and rhythm, no murmur. Gastrointestinal system: Abdomen is soft, non tender, non distended, BS+ Central nervous system: Alert and oriented x 2. No focal neurological deficits. Extremities: Ulcers in both heels and right ankle. Skin: No rashes, lesions or ulcers Psychiatry: Judgement and insight appear normal. Mood & affect appropriate.     Data Reviewed: I have personally reviewed following labs and imaging studies  CBC: Recent Labs  Lab 12/19/22 0353 12/20/22 0516  WBC 21.7* 15.4*  NEUTROABS 18.6*  --   HGB 8.9* 7.3*  HCT 29.8* 24.0*  MCV 79.3* 80.0  PLT 517* 465*   Basic Metabolic Panel: Recent Labs  Lab 12/19/22 0353 12/20/22 0516  NA 136 138  K 3.8 3.6  CL 108 113*  CO2 18* 18*   GLUCOSE 192* 112*  BUN 20 28*  CREATININE 0.98 0.83  CALCIUM 8.1* 7.5*   GFR: Estimated Creatinine Clearance: 37.5 mL/min (by C-G formula based on SCr of 0.83 mg/dL). Liver Function Tests: Recent Labs  Lab 12/19/22 0353  AST 27  ALT 9  ALKPHOS 64  BILITOT 0.7  PROT 7.6  ALBUMIN 2.8*   Recent Labs  Lab 12/19/22 0353  LIPASE 35   No results for input(s): "AMMONIA" in the last 168 hours. Coagulation Profile: Recent Labs  Lab 12/19/22 0756  INR 1.5*   Cardiac Enzymes: No results for input(s): "CKTOTAL", "CKMB", "CKMBINDEX", "TROPONINI" in the last 168 hours. BNP (last 3 results) No results for input(s): "PROBNP" in the last 8760 hours. HbA1C: Recent Labs    12/19/22 0353  HGBA1C 6.1*   CBG: Recent Labs  Lab 12/19/22 1118 12/19/22 1633 12/19/22 2151 12/20/22 0855 12/20/22 1105  GLUCAP 126* 153* 136* 91 105*   Lipid Profile: Recent Labs    12/20/22 0516  CHOL 136  HDL 22*  LDLCALC 85  TRIG 143  CHOLHDL 6.2   Thyroid Function Tests: No results for input(s): "TSH", "T4TOTAL", "FREET4", "T3FREE", "THYROIDAB" in the last 72 hours. Anemia Panel: No results for input(s): "VITAMINB12", "FOLATE", "FERRITIN", "TIBC", "IRON", "RETICCTPCT" in the last 72 hours. Sepsis Labs: Recent Labs  Lab 12/19/22 0353 12/19/22 0534  PROCALCITON 0.47  --   LATICACIDVEN 2.8* 2.1*    Recent Results (from the past 240 hour(s))  Culture, blood (routine x 2)     Status: None (Preliminary result)   Collection Time: 12/19/22  3:43 AM   Specimen: BLOOD  Result Value Ref Range Status   Specimen Description BLOOD RIGHT [facility]  Final   Special Requests   Final    BOTTLES DRAWN AEROBIC AND ANAEROBIC Blood Culture results may not be optimal due to an excessive volume of blood received in culture bottles   Culture   Final    NO GROWTH 1 DAY Performed at [facility], [address] [place], [place] [address]    Report Status PENDING  Incomplete  Resp panel by RT-PCR  (RSV, Flu A&B, Covid) Urine, Catheterized     Status: None   Collection Time: 12/19/22  3:43 AM   Specimen: Urine, Catheterized; Nasal Swab  Result Value Ref Range Status  SARS Coronavirus 2 by RT PCR NEGATIVE NEGATIVE Final    Comment: (NOTE) SARS-CoV-2 target nucleic acids are NOT DETECTED.  The SARS-CoV-2 RNA is generally detectable in upper respiratory specimens during the acute phase of infection. The lowest concentration of SARS-CoV-2 viral copies this assay can detect is 138 copies/mL. A negative result does not preclude SARS-Cov-2 infection and should not be used as the sole basis for treatment or other patient management decisions. A negative result may occur with  improper specimen collection/handling, submission of specimen other than nasopharyngeal swab, presence of viral mutation(s) within the areas targeted by this assay, and inadequate number of viral copies(<138 copies/mL). A negative result must be combined with clinical observations, patient history, and epidemiological information. The expected result is Negative.  Fact Sheet for Patients:  EntrepreneurPulse.com.au  Fact Sheet for Healthcare Providers:  IncredibleEmployment.be  This test is no t yet approved or cleared by the [place] FDA and  has been authorized for detection and/or diagnosis of SARS-CoV-2 by FDA under an Emergency Use Authorization (EUA). This EUA will remain  in effect (meaning this test can be used) for the duration of the COVID-19 declaration under Section 564(b)(1) of the Act, 21 U.S.C.section 360bbb-3(b)(1), unless the authorization is terminated  or revoked sooner.       Influenza A by PCR NEGATIVE NEGATIVE Final   Influenza B by PCR NEGATIVE NEGATIVE Final    Comment: (NOTE) The Xpert Xpress SARS-CoV-2/FLU/RSV plus assay is intended as an aid in the diagnosis of influenza from Nasopharyngeal swab specimens and should not be used as a sole  basis for treatment. Nasal washings and aspirates are unacceptable for Xpert Xpress SARS-CoV-2/FLU/RSV testing.  Fact Sheet for Patients: EntrepreneurPulse.com.au  Fact Sheet for Healthcare Providers: IncredibleEmployment.be  This test is not yet approved or cleared by the [place] FDA and has been authorized for detection and/or diagnosis of SARS-CoV-2 by FDA under an Emergency Use Authorization (EUA). This EUA will remain in effect (meaning this test can be used) for the duration of the COVID-19 declaration under Section 564(b)(1) of the Act, 21 U.S.C. section 360bbb-3(b)(1), unless the authorization is terminated or revoked.     Resp Syncytial Virus by PCR NEGATIVE NEGATIVE Final    Comment: (NOTE) Fact Sheet for Patients: EntrepreneurPulse.com.au  Fact Sheet for Healthcare Providers: IncredibleEmployment.be  This test is not yet approved or cleared by the [place] FDA and has been authorized for detection and/or diagnosis of SARS-CoV-2 by FDA under an Emergency Use Authorization (EUA). This EUA will remain in effect (meaning this test can be used) for the duration of the COVID-19 declaration under Section 564(b)(1) of the Act, 21 U.S.C. section 360bbb-3(b)(1), unless the authorization is terminated or revoked.  Performed at [facility], [address], [place], [place] [address]   Urine Culture     Status: Abnormal   Collection Time: 12/19/22  3:43 AM   Specimen: Urine, Catheterized  Result Value Ref Range Status   Specimen Description   Final    URINE, CATHETERIZED Performed at [facility], [place] [address], [place], [place] [address]    Special Requests   Final    NONE Reflexed from [id number] Performed at [facility], [address] [place], [place] [address]    Culture (A)  Final    <10,000 COLONIES/mL INSIGNIFICANT GROWTH Performed at [facility] [address] [place], [place] [address]    Report Status 12/20/2022 FINAL  Final  Culture, blood (routine  x 2)     Status: None (Preliminary result)   Collection Time: 12/19/22  3:53 AM   Specimen: BLOOD  Result Value Ref Range Status   Specimen Description BLOOD LEFT FOREARM  Final   Special Requests   Final    BOTTLES DRAWN AEROBIC AND ANAEROBIC Blood Culture results may not be optimal due to an inadequate volume of blood received in culture bottles   Culture   Final    NO GROWTH 1 DAY Performed at [facility], [address], [place], [place] [address]    Report Status PENDING  Incomplete  Aerobic/Anaerobic Culture w Gram Stain (surgical/deep wound)     Status: None (Preliminary result)   Collection Time: 12/19/22  5:27 AM   Specimen: Foot  Result Value Ref Range Status   Specimen Description   Final    FOOT Performed at [facility], [address] [place], [place] [address]    Special Requests   Final    NONE Performed at [facility], [address] [place], [place] [address]    Gram Stain   Final    RARE WBC PRESENT, PREDOMINANTLY PMN FEW GRAM POSITIVE COCCI IN PAIRS IN CLUSTERS    Culture   Final    FEW PROTEUS MIRABILIS CULTURE REINCUBATED FOR BETTER GROWTH SUSCEPTIBILITIES TO FOLLOW Performed at [facility], [place] [address], [place], [place] [address]    Report Status PENDING  Incomplete    Radiology Studies: ECHOCARDIOGRAM LIMITED  Result Date: 12/20/2022    ECHOCARDIOGRAM LIMITED REPORT   Patient Name:   NAWAL BURLING [facility] Date of Exam: 12/20/2022 Medical Rec #:  [id number]          Height:       59.0 in Accession #:    [id number]         Weight:       109.6 lb Date of Birth:  May 19, 1941          BSA:          1.428 m Patient Age:    29 years           BP:           119/73 mmHg Patient Gender: F                  HR:           88 bpm. Exam Location:  [facility] Procedure: Limited Echo Indications:     NSTEMI  History:          Patient has prior history of Echocardiogram examinations, most                  recent 10/31/2022. CHF, Acute MI, PAD,                  Signs/Symptoms:Bacteremia; Risk Factors:Hypertension, Diabetes                  and Dyslipidemia. Thrombus, Breast CA.  Sonographer:     Wenda Low Referring Phys:  [id number] Hebert Soho Diagnosing Phys: Ida Rogue MD  Sonographer Comments: Image acquisition challenging due to respiratory motion. IMPRESSIONS  1. Left ventricular ejection fraction, by estimation, is 40 to 45%. The left ventricle has mildly decreased function. The left ventricle demonstrates regional wall motion abnormalities (hypokinesis of the anterior and anteroseptal wall). Left ventricular diastolic parameters are indeterminate. Wall motion abnormality appears new compared to prior study dated 12/23.  2. Right ventricular systolic function  is low normal. The right ventricular size is normal. Tricuspid regurgitation signal is inadequate for assessing PA pressure.  3. The mitral valve is normal in structure. No evidence of mitral valve regurgitation. No evidence of mitral stenosis. Moderate mitral annular calcification.  4. The aortic valve is normal in structure. Aortic valve regurgitation is not visualized. Aortic valve sclerosis is present, with no evidence of aortic valve stenosis.  5. The inferior vena cava is normal in size with greater than 50% respiratory variability, suggesting right atrial pressure of 3 mmHg. FINDINGS  Left Ventricle: Left ventricular ejection fraction, by estimation, is 40 to 45%. The left ventricle has mildly decreased function. The left ventricle demonstrates regional wall motion abnormalities. The left ventricular internal cavity size was normal in size. There is no left ventricular hypertrophy. Left ventricular diastolic parameters are indeterminate. Right Ventricle: The right ventricular size is normal. No increase in right ventricular wall thickness. Right ventricular  systolic function is low normal. Tricuspid regurgitation signal is inadequate for assessing PA pressure. Left Atrium: Left atrial size was normal in size. Right Atrium: Right atrial size was normal in size. Pericardium: There is no evidence of pericardial effusion. Mitral Valve: The mitral valve is normal in structure. There is mild calcification of the mitral valve leaflet(s). Moderate mitral annular calcification. No evidence of mitral valve stenosis. Tricuspid Valve: The tricuspid valve is normal in structure. Tricuspid valve regurgitation is not demonstrated. No evidence of tricuspid stenosis. Aortic Valve: The aortic valve is normal in structure. Aortic valve regurgitation is not visualized. Aortic valve sclerosis is present, with no evidence of aortic valve stenosis. Pulmonic Valve: The pulmonic valve was normal in structure. Pulmonic valve regurgitation is not visualized. No evidence of pulmonic stenosis. Aorta: The aortic root is normal in size and structure. Venous: The inferior vena cava is normal in size with greater than 50% respiratory variability, suggesting right atrial pressure of 3 mmHg. IAS/Shunts: No atrial level shunt detected by color flow Doppler. LEFT VENTRICLE PLAX 2D LVIDd:         4.10 cm LVIDs:         2.40 cm LV PW:         1.30 cm LV IVS:        1.00 cm LVOT diam:     1.90 cm LVOT Area:     2.84 cm  LEFT ATRIUM         Index LA diam:    3.80 cm 2.66 cm/m   AORTA Ao Root diam: 2.70 cm  SHUNTS Systemic Diam: 1.90 cm Ida Rogue MD Electronically signed by Ida Rogue MD Signature Date/Time: 12/20/2022/1:13:18 PM    Final    CT Head Wo Contrast  Result Date: 12/19/2022 CLINICAL DATA:  82 year old female with altered mental status. Fever. EXAM: CT HEAD WITHOUT CONTRAST TECHNIQUE: Contiguous axial images were obtained from the base of the skull through the vertex without intravenous contrast. RADIATION DOSE REDUCTION: This exam was performed according to the departmental  dose-optimization program which includes automated exposure control, adjustment of the mA and/or kV according to patient size and/or use of iterative reconstruction technique. COMPARISON:  None Available. FINDINGS: Brain: No midline shift, ventriculomegaly, mass effect, evidence of mass lesion, intracranial hemorrhage or evidence of cortically based acute infarction. No cortical encephalomalacia identified. Mild to moderate for age scattered bilateral cerebral white matter hypodensity with some deep white matter capsule involvement. Mild heterogeneity in the left basal ganglia. Vascular: Calcified atherosclerosis at the skull base. No suspicious intracranial vascular hyperdensity.  Skull: Intact, negative. Sinuses/Orbits: Visualized paranasal sinuses and mastoids are clear. Other: No acute orbit or scalp soft tissue finding. IMPRESSION: 1. No acute intracranial abnormality. 2. Mild to moderate heterogeneity in the cerebral white matter and left basal for age ganglia compatible with small vessel disease. Electronically Signed   By: Genevie Ann M.D.   On: 12/19/2022 06:32   CT Abdomen Pelvis W Contrast  Result Date: 12/19/2022 CLINICAL DATA:  82 year old female with history of acute onset of nonlocalized abdominal pain. EXAM: CT ABDOMEN AND PELVIS WITH CONTRAST TECHNIQUE: Multidetector CT imaging of the abdomen and pelvis was performed using the standard protocol following bolus administration of intravenous contrast. RADIATION DOSE REDUCTION: This exam was performed according to the departmental dose-optimization program which includes automated exposure control, adjustment of the mA and/or kV according to patient size and/or use of iterative reconstruction technique. CONTRAST:  44m OMNIPAQUE IOHEXOL 300 MG/ML  SOLN COMPARISON:  CT of the abdomen and pelvis 11/01/2022. FINDINGS: Lower chest: Trace bilateral pleural effusions lying dependently. Atherosclerotic calcifications are noted in the descending thoracic aorta.  Calcifications of the mitral annulus. Hepatobiliary: No definite suspicious cystic or solid hepatic lesions are noted. No intra or extrahepatic biliary ductal dilatation. Status post cholecystectomy. Pancreas: No pancreatic mass. No pancreatic ductal dilatation. No pancreatic or peripancreatic fluid collections or inflammatory changes. Spleen: Unremarkable. Adrenals/Urinary Tract: Extensive cortical thinning noted in the anterior aspect of the left kidney, presumably scarring from prior infection or infarction. Right kidney and bilateral adrenal glands are otherwise normal in appearance. No hydroureteronephrosis. Urinary bladder is moderately distended, and base of the urinary bladder extends well below the level of the pubococcygeal line at rest, indicative of a cystocele from pelvic floor laxity. Stomach/Bowel: The appearance of the stomach is normal. No pathologic dilatation of small bowel or colon. Numerous colonic diverticuli are noted, without definite focal surrounding inflammatory changes to indicate an acute diverticulitis at this time. Normal appendix. Vascular/Lymphatic: Aortic atherosclerosis, without evidence of aneurysm or dissection in the abdominal or pelvic vasculature. IVC filter noted, with tip terminating shortly below the level of the renal veins. No lymphadenopathy noted in the abdomen or pelvis. Reproductive: Status post hysterectomy. Ovaries are not confidently identified may be surgically absent or atrophic. Other: No significant volume of ascites.  No pneumoperitoneum. Musculoskeletal: There are no aggressive appearing lytic or blastic lesions noted in the visualized portions of the skeleton. IMPRESSION: 1. No definite acute findings noted in the abdomen or pelvis to account for the patient's symptoms. 2. Trace bilateral pleural effusions lying dependently. 3. Colonic diverticulosis without evidence of acute diverticulitis at this time. 4. Aortic atherosclerosis. 5. Pelvic floor laxity with  cystocele. 6. Additional incidental findings, as above. Electronically Signed   By: DVinnie LangtonM.D.   On: 12/19/2022 06:26   DG Foot Complete Right  Result Date: 12/19/2022 CLINICAL DATA:  [age]year old female with fever, altered mental status. Foot wound. Query osteomyelitis. EXAM: RIGHT FOOT COMPLETE - 3+ VIEW COMPARISON:  Calcaneus series 11/02/2022. FINDINGS: Bulky chronic degenerative and/or post arthrodesis changes at the right ankle joint. Chronic sclerosis and cannulated screw fragment there appear grossly stable since December. Bulky chronic dorsal calcaneus degenerative spurring with overlying soft tissue deficiency. But no osteolysis to indicate osteomyelitis. No soft tissue gas. No superimposed acute fracture or dislocation identified in the right foot. Hallux valgus and metatarsus primus varus. IMPRESSION: 1. Soft tissue deficiency overlying the dorsal calcaneus but no radiographic evidence of osteomyelitis. 2. Bulky chronic degenerative and/or post arthrodesis changes at the  right ankle, with retained screw fragment. Electronically Signed   By: Genevie Ann M.D.   On: 12/19/2022 04:21   DG Foot Complete Left  Result Date: 12/19/2022 CLINICAL DATA:  82 year old female with fever, altered mental status. Foot wound. Query osteomyelitis. EXAM: LEFT FOOT - COMPLETE 3+ VIEW COMPARISON:  None Available. FINDINGS: Dorsal soft tissue wound overlying the calcaneus. Achilles insertion degenerative spurring at the calcaneus there, mildly discontinuous but no cortical osteolysis to suggest osteomyelitis. No tracking soft tissue gas. Hallux valgus and metatarsus primus varus. No acute fracture or dislocation identified. Some calcified peripheral vascular disease. IMPRESSION: 1. Dorsal soft tissue wound overlying the calcaneus with no radiographic evidence of Osteomyelitis. 2.  No acute osseous abnormality identified. Electronically Signed   By: Genevie Ann M.D.   On: 12/19/2022 04:18   DG Chest Port 1  View  Result Date: 12/19/2022 CLINICAL DATA:  82 year old female with fever, altered mental status. Confusion. EXAM: PORTABLE CHEST 1 VIEW COMPARISON:  Portable chest 11/01/2022 and earlier. FINDINGS: Portable AP semi upright view at 0401 hours. Mildly rotated to the right. Improved lung volumes compared to prior portable exams. Mediastinal contours are within normal limits. Allowing for portable technique the lungs are clear. No pneumothorax or pleural effusion identified. Chronic degenerative and postoperative changes at the shoulders. No acute osseous abnormality identified. Paucity of bowel gas in the visible abdomen. IMPRESSION: No acute cardiopulmonary abnormality. Electronically Signed   By: Genevie Ann M.D.   On: 12/19/2022 04:16    Scheduled Meds:  acetaminophen  650 mg Rectal Once   aspirin EC  81 mg Oral Daily   atorvastatin  40 mg Oral Daily   insulin aspart  0-5 Units Subcutaneous QHS   insulin aspart  0-9 Units Subcutaneous TID WC   leptospermum manuka honey  1 Application Topical Daily   levothyroxine  88 mcg Oral Daily   midodrine  10 mg Oral TID WC   sodium chloride flush  10-40 mL Intracatheter Q12H   Continuous Infusions:  sodium chloride 75 mL/hr at 12/20/22 0513   cefTRIAXone (ROCEPHIN)  IV 1 g (12/20/22 1114)   heparin 1,100 Units/hr (12/20/22 1109)   vancomycin       LOS: 1 day    Time spent: 50 mins    Desmond Tufano, MD [facility]   If 7PM-7AM, please contact night-coverage

## 2022-12-20 NOTE — Progress Notes (Signed)
ANTICOAGULATION CONSULT NOTE  Pharmacy Consult for IV Heparin Indication: ACS/STEMI  Patient Measurements: Height: '4\' 11"'$  (149.9 cm) Weight: 49.7 kg (109 lb 9.6 oz) IBW/kg (Calculated) : 43.2 Heparin Dosing Weight: 49.7 kg  Labs: Recent Labs    12/19/22 0353 12/19/22 0534 12/19/22 0756 12/19/22 1523 12/19/22 1911 12/20/22 0013 12/20/22 0516 12/20/22 0945  HGB 8.9*  --   --   --   --   --  7.3*  --   HCT 29.8*  --   --   --   --   --  24.0*  --   PLT 517*  --   --   --   --   --  410*  --   APTT  --   --  113*  --   --   --   --   --   LABPROT  --   --  17.6*  --   --   --   --   --   INR  --   --  1.5*  --   --   --   --   --   HEPARINUNFRC  --   --   --  <0.10*  --  0.17*  --  0.15*  CREATININE 0.98  --   --   --   --   --  0.83  --   TROPONINIHS 2,280*   < > 2,592* 1,991* 1,515*  --   --   --    < > = values in this interval not displayed.    Estimated Creatinine Clearance: 37.5 mL/min (by C-G formula based on SCr of 0.83 mg/dL).  Medical History: Past Medical History:  Diagnosis Date   Arthritis    Arthritis    Breast cancer ([place]) 1992   right breast with lumpectomy and rad tx   Cancer of right breast ([place]) 04/16/2013   right breast with mastectomy   Diabetes mellitus without complication (HCC)    Gout    High cholesterol    Hyperlipidemia    Hypertension    Personal history of radiation therapy    Assessment: Pt is a 82 yo female presenting to ED due to fever, confusion, and abdominal pain, found with elevated troponin I level, trending up. Pharmacy consulted to initiate and manage heparin infusion for suspected ACS.  0207 1523 HL < 0.10, subthera; 650 un/hr 0208 0013 HL 0.17, subtherapeutic 0208 0945 HL 0.15  Goal of Therapy:  Heparin level 0.3-0.7 units/ml Monitor platelets by anticoagulation protocol: Yes   Plan:  Heparin level is subtherapeutic. Will give heparin bolus of 2000 units x 1 and increase heparin infusion to 1100 units/hr. Recheck  heparin level in 8 hours. CBC daily while on heparin. Plan for heparin for 48 hours.   Eleonore Chiquito,  PharmD, 12/20/2022 10:58 AM

## 2022-12-20 NOTE — Progress Notes (Signed)
ANTICOAGULATION CONSULT NOTE  Pharmacy Consult for IV Heparin Indication: ACS/STEMI  Patient Measurements: Height: '4\' 11"'$  (149.9 cm) Weight: 49.7 kg (109 lb 9.6 oz) IBW/kg (Calculated) : 43.2 Heparin Dosing Weight: 49.7 kg  Labs: Recent Labs    12/19/22 0353 12/19/22 0534 12/19/22 0756 12/19/22 1523 12/19/22 1911 12/20/22 0013  HGB 8.9*  --   --   --   --   --   HCT 29.8*  --   --   --   --   --   PLT 517*  --   --   --   --   --   APTT  --   --  113*  --   --   --   LABPROT  --   --  17.6*  --   --   --   INR  --   --  1.5*  --   --   --   HEPARINUNFRC  --   --   --  <0.10*  --  0.17*  CREATININE 0.98  --   --   --   --   --   TROPONINIHS 2,280*   < > 2,592* 1,991* 1,515*  --    < > = values in this interval not displayed.    Estimated Creatinine Clearance: 31.7 mL/min (by C-G formula based on SCr of 0.98 mg/dL).  Medical History: Past Medical History:  Diagnosis Date   Arthritis    Arthritis    Breast cancer ([place]) 1992   right breast with lumpectomy and rad tx   Cancer of right breast ([place]) 04/16/2013   right breast with mastectomy   Diabetes mellitus without complication (HCC)    Gout    High cholesterol    Hyperlipidemia    Hypertension    Personal history of radiation therapy    Assessment: Pt is a 82 yo female presenting to ED due to fever, confusion, and abdominal pain, found with elevated troponin I level, trending up. Pharmacy consulted to initiate and manage heparin infusion for suspected ACS.  0207 1523 HL < 0.10, subthera; 650 un/hr 0208 0013 HL 0.17, subtherapeutic  Goal of Therapy:  Heparin level 0.3-0.7 units/ml Monitor platelets by anticoagulation protocol: Yes   Plan:  --Heparin 1500 unit IV bolus and increase heparin infusion rate to 950 units/hr --Re-check HL 8 hours from rate change --Daily CBC per protocol while on IV heparin  Renda Rolls, PharmD, [facility] 12/20/2022 1:28 AM

## 2022-12-20 NOTE — Progress Notes (Signed)
ANTICOAGULATION CONSULT NOTE  Pharmacy Consult for IV Heparin Indication: ACS/STEMI  Patient Measurements: Height: '4\' 11"'$  (149.9 cm) Weight: 51.2 kg (112 lb 14 oz) IBW/kg (Calculated) : 43.2 Heparin Dosing Weight: 49.7 kg  Labs: Recent Labs    12/19/22 0353 12/19/22 0534 12/19/22 0756 12/19/22 1523 12/19/22 1523 12/19/22 1911 12/20/22 0013 12/20/22 0516 12/20/22 0945 12/20/22 2030  HGB 8.9*  --   --   --   --   --   --  7.3*  --   --   HCT 29.8*  --   --   --   --   --   --  24.0*  --   --   PLT 517*  --   --   --   --   --   --  410*  --   --   APTT  --   --  113*  --   --   --   --   --   --   --   LABPROT  --   --  17.6*  --   --   --   --   --   --   --   INR  --   --  1.5*  --   --   --   --   --   --   --   HEPARINUNFRC  --   --   --  <0.10*   < >  --  0.17*  --  0.15* 0.25*  CREATININE 0.98  --   --   --   --   --   --  0.83  --   --   TROPONINIHS 2,280*   < > 2,592* 1,991*  --  1,515*  --   --   --   --    < > = values in this interval not displayed.    Estimated Creatinine Clearance: 37.5 mL/min (by C-G formula based on SCr of 0.83 mg/dL).  Medical History: Past Medical History:  Diagnosis Date   Arthritis    Arthritis    Breast cancer ([place]) 1992   right breast with lumpectomy and rad tx   Cancer of right breast ([place]) 04/16/2013   right breast with mastectomy   Diabetes mellitus without complication (HCC)    Gout    High cholesterol    Hyperlipidemia    Hypertension    Personal history of radiation therapy    Assessment: Pt is a 82 yo female presenting to ED due to fever, confusion, and abdominal pain, found with elevated troponin I level, trending up. Pharmacy consulted to initiate and manage heparin infusion for suspected ACS.  0207 1523 HL < 0.10, subthera; 650 un/hr 0208 0013 HL 0.17, subtherapeutic 0208 0945 HL 0.15 0208 2030 HL 0.25, subtherapeutic   Goal of Therapy:  Heparin level 0.3-0.7 units/ml Monitor platelets by anticoagulation  protocol: Yes   Plan:  Give 750 units bolus x1; then increase heparin infusion to 1200 units/hr Check anti-Xa level in 8 hours and daily once consecutively therapeutic. Continue to monitor H&H and platelets daily while on heparin gtt.  [place] Pharmacist 12/20/2022 9:47 PM

## 2022-12-21 DIAGNOSIS — L97511 Non-pressure chronic ulcer of other part of right foot limited to breakdown of skin: Secondary | ICD-10-CM | POA: Diagnosis not present

## 2022-12-21 DIAGNOSIS — R339 Retention of urine, unspecified: Secondary | ICD-10-CM

## 2022-12-21 DIAGNOSIS — L97412 Non-pressure chronic ulcer of right heel and midfoot with fat layer exposed: Secondary | ICD-10-CM | POA: Diagnosis not present

## 2022-12-21 DIAGNOSIS — B952 Enterococcus as the cause of diseases classified elsewhere: Secondary | ICD-10-CM | POA: Diagnosis not present

## 2022-12-21 DIAGNOSIS — A419 Sepsis, unspecified organism: Secondary | ICD-10-CM | POA: Diagnosis not present

## 2022-12-21 DIAGNOSIS — I214 Non-ST elevation (NSTEMI) myocardial infarction: Secondary | ICD-10-CM | POA: Diagnosis not present

## 2022-12-21 DIAGNOSIS — R7881 Bacteremia: Secondary | ICD-10-CM

## 2022-12-21 DIAGNOSIS — I5032 Chronic diastolic (congestive) heart failure: Secondary | ICD-10-CM | POA: Diagnosis not present

## 2022-12-21 DIAGNOSIS — R509 Fever, unspecified: Secondary | ICD-10-CM

## 2022-12-21 LAB — CBC
HCT: 32.1 % — ABNORMAL LOW (ref 36.0–46.0)
Hemoglobin: 10 g/dL — ABNORMAL LOW (ref 12.0–15.0)
MCH: 25.4 pg — ABNORMAL LOW (ref 26.0–34.0)
MCHC: 31.2 g/dL (ref 30.0–36.0)
MCV: 81.5 fL (ref 80.0–100.0)
Platelets: 438 10*3/uL — ABNORMAL HIGH (ref 150–400)
RBC: 3.94 MIL/uL (ref 3.87–5.11)
RDW: 17.2 % — ABNORMAL HIGH (ref 11.5–15.5)
WBC: 12.6 10*3/uL — ABNORMAL HIGH (ref 4.0–10.5)
nRBC: 0.2 % (ref 0.0–0.2)

## 2022-12-21 LAB — TYPE AND SCREEN
ABO/RH(D): O NEG
Antibody Screen: NEGATIVE
Unit division: 0

## 2022-12-21 LAB — BLOOD CULTURE ID PANEL (REFLEXED) - BCID2

## 2022-12-21 LAB — GLUCOSE, CAPILLARY
Glucose-Capillary: 109 mg/dL — ABNORMAL HIGH (ref 70–99)
Glucose-Capillary: 115 mg/dL — ABNORMAL HIGH (ref 70–99)
Glucose-Capillary: 110 mg/dL — ABNORMAL HIGH (ref 70–99)
Glucose-Capillary: 103 mg/dL — ABNORMAL HIGH (ref 70–99)
Glucose-Capillary: 133 mg/dL — ABNORMAL HIGH (ref 70–99)

## 2022-12-21 LAB — BPAM RBC
Blood Product Expiration Date: 202402202359
ISSUE DATE / TIME: 202402082059
Unit Type and Rh: 9500

## 2022-12-21 LAB — HEPARIN LEVEL (UNFRACTIONATED): Heparin Unfractionated: 0.43 IU/mL (ref 0.30–0.70)

## 2022-12-21 MED ORDER — SODIUM CHLORIDE 0.9 % IV SOLN
3.0000 g | Freq: Four times a day (QID) | INTRAVENOUS | Status: DC
  Administered 2022-12-22 – 2022-12-24 (×10): 3 g via INTRAVENOUS
  Filled 2022-12-21: qty 8
  Filled 2022-12-21: qty 3
  Filled 2022-12-21 (×2): qty 8
  Filled 2022-12-21: qty 3
  Filled 2022-12-21 (×9): qty 8

## 2022-12-21 NOTE — Progress Notes (Signed)
Rounding Note    Patient Name: [name] Date of Encounter: 12/21/2022  [place] Cardiologist: new to [facility], [facility]   Subjective   Mild confusion this morning, resting comfortably in bed no complaints Not hungry at this time, food by her bedside Denies significant shortness of breath or chest discomfort Blood pressure stabilized [id number] 40 systolic Received 1 unit blood transfusion yesterday hemoglobin up to 10 Appears heparin has been discontinued  Inpatient Medications    Scheduled Meds:  acetaminophen  650 mg Rectal Once   aspirin EC  81 mg Oral Daily   atorvastatin  40 mg Oral Daily   Chlorhexidine Gluconate Cloth  6 each Topical Daily   insulin aspart  0-5 Units Subcutaneous QHS   insulin aspart  0-9 Units Subcutaneous TID WC   leptospermum manuka honey  1 Application Topical Daily   levothyroxine  88 mcg Oral Daily   sodium chloride flush  10-40 mL Intracatheter Q12H   Continuous Infusions:  sodium chloride 75 mL/hr at 12/21/22 0700   cefTRIAXone (ROCEPHIN)  IV 1 g (12/21/22 1250)   vancomycin Stopped (12/20/22 1950)   PRN Meds: acetaminophen, acetaminophen, diphenoxylate-atropine, hydrALAZINE, HYDROcodone-acetaminophen, ondansetron (ZOFRAN) IV, sodium chloride flush   Vital Signs    Vitals:   12/21/22 0800 12/21/22 0900 12/21/22 1000 12/21/22 1100  BP: 129/85 (!) 144/104 (!) 144/93 (!) 110/96  Pulse: 76 89 84 (!) 106  Resp: 16 (!) 22 18 (!) 24  Temp: 98.2 F (36.8 C)     TempSrc: Oral     SpO2: 100% 100% 100% 100%  Weight:      Height:        Intake/Output Summary (Last 24 hours) at 12/21/2022 1445 Last data filed at 12/21/2022 1250 Gross per 24 hour  Intake 1488.79 ml  Output 1225 ml  Net 263.79 ml      12/21/2022    5:00 AM 12/20/2022    5:51 PM 12/20/2022    5:45 PM  Last 3 Weights  Weight (lbs) 115 lb 15.4 oz 112 lb 14 oz 112 lb 14 oz  Weight (kg) 52.6 kg 51.2 kg 51.2 kg      Telemetry    Normal sinus rhythm-  Personally Reviewed  ECG    - Personally Reviewed  Physical Exam   GEN: No acute distress.   Neck: No JVD Cardiac: RRR, no murmurs, rubs, or gallops.  Respiratory: Clear to auscultation bilaterally. GI: Soft, nontender, non-distended  MS: No edema; No deformity. Neuro:  Nonfocal  Psych: Normal affect   Labs    High Sensitivity Troponin:   Recent Labs  Lab 12/19/22 0353 12/19/22 0534 12/19/22 0756 12/19/22 1523 12/19/22 1911  TROPONINIHS 2,280* 2,583* 2,592* 1,991* 1,515*     Chemistry Recent Labs  Lab 12/19/22 0353 12/20/22 0516  NA 136 138  K 3.8 3.6  CL 108 113*  CO2 18* 18*  GLUCOSE 192* 112*  BUN 20 28*  CREATININE 0.98 0.83  CALCIUM 8.1* 7.5*  PROT 7.6  --   ALBUMIN 2.8*  --   AST 27  --   ALT 9  --   ALKPHOS 64  --   BILITOT 0.7  --   GFRNONAA 59* >60  ANIONGAP 10 7    Lipids  Recent Labs  Lab 12/20/22 0516  CHOL 136  TRIG 143  HDL 22*  LDLCALC 85  CHOLHDL 6.2    Hematology Recent Labs  Lab 12/19/22 0353 12/20/22 0516 12/21/22 0657  WBC 21.7* 15.4*  12.6*  RBC 3.76* 3.00* 3.94  HGB 8.9* 7.3* 10.0*  HCT 29.8* 24.0* 32.1*  MCV 79.3* 80.0 81.5  MCH 23.7* 24.3* 25.4*  MCHC 29.9* 30.4 31.2  RDW 17.9* 18.0* 17.2*  PLT 517* 410* 438*   Thyroid No results for input(s): "TSH", "FREET4" in the last 168 hours.  BNP Recent Labs  Lab 12/19/22 0756  BNP 719.3*    DDimer No results for input(s): "DDIMER" in the last 168 hours.   Radiology    ECHOCARDIOGRAM LIMITED  Result Date: 12/20/2022    ECHOCARDIOGRAM LIMITED REPORT   Patient Name:   [name] [facility] Date of Exam: 12/20/2022 Medical Rec #:  [id number]          Height:       59.0 in Accession #:    [id number]         Weight:       109.6 lb Date of Birth:  December 31, 1940          BSA:          1.428 m Patient Age:    82 years           BP:           119/73 mmHg Patient Gender: F                  HR:           88 bpm. Exam Location:  [facility] Procedure: Limited Echo Indications:     NSTEMI   History:         Patient has prior history of Echocardiogram examinations, most                  recent 10/31/2022. CHF, Acute MI, PAD,                  Signs/Symptoms:Bacteremia; Risk Factors:Hypertension, Diabetes                  and Dyslipidemia. Thrombus, Breast CA.  Sonographer:     Wenda Low Referring Phys:  [id number] Hebert Soho Diagnosing Phys: Ida Rogue MD  Sonographer Comments: Image acquisition challenging due to respiratory motion. IMPRESSIONS  1. Left ventricular ejection fraction, by estimation, is 40 to 45%. The left ventricle has mildly decreased function. The left ventricle demonstrates regional wall motion abnormalities (hypokinesis of the anterior and anteroseptal wall). Left ventricular diastolic parameters are indeterminate. Wall motion abnormality appears new compared to prior study dated 12/23.  2. Right ventricular systolic function is low normal. The right ventricular size is normal. Tricuspid regurgitation signal is inadequate for assessing PA pressure.  3. The mitral valve is normal in structure. No evidence of mitral valve regurgitation. No evidence of mitral stenosis. Moderate mitral annular calcification.  4. The aortic valve is normal in structure. Aortic valve regurgitation is not visualized. Aortic valve sclerosis is present, with no evidence of aortic valve stenosis.  5. The inferior vena cava is normal in size with greater than 50% respiratory variability, suggesting right atrial pressure of 3 mmHg. FINDINGS  Left Ventricle: Left ventricular ejection fraction, by estimation, is 40 to 45%. The left ventricle has mildly decreased function. The left ventricle demonstrates regional wall motion abnormalities. The left ventricular internal cavity size was normal in size. There is no left ventricular hypertrophy. Left ventricular diastolic parameters are indeterminate. Right Ventricle: The right ventricular size is normal. No increase in right ventricular wall thickness.  Right ventricular systolic function is low normal. Tricuspid regurgitation signal  is inadequate for assessing PA pressure. Left Atrium: Left atrial size was normal in size. Right Atrium: Right atrial size was normal in size. Pericardium: There is no evidence of pericardial effusion. Mitral Valve: The mitral valve is normal in structure. There is mild calcification of the mitral valve leaflet(s). Moderate mitral annular calcification. No evidence of mitral valve stenosis. Tricuspid Valve: The tricuspid valve is normal in structure. Tricuspid valve regurgitation is not demonstrated. No evidence of tricuspid stenosis. Aortic Valve: The aortic valve is normal in structure. Aortic valve regurgitation is not visualized. Aortic valve sclerosis is present, with no evidence of aortic valve stenosis. Pulmonic Valve: The pulmonic valve was normal in structure. Pulmonic valve regurgitation is not visualized. No evidence of pulmonic stenosis. Aorta: The aortic root is normal in size and structure. Venous: The inferior vena cava is normal in size with greater than 50% respiratory variability, suggesting right atrial pressure of 3 mmHg. IAS/Shunts: No atrial level shunt detected by color flow Doppler. LEFT VENTRICLE PLAX 2D LVIDd:         4.10 cm LVIDs:         2.40 cm LV PW:         1.30 cm LV IVS:        1.00 cm LVOT diam:     1.90 cm LVOT Area:     2.84 cm  LEFT ATRIUM         Index LA diam:    3.80 cm 2.66 cm/m   AORTA Ao Root diam: 2.70 cm  SHUNTS Systemic Diam: 1.90 cm Ida Rogue MD Electronically signed by Ida Rogue MD Signature Date/Time: 12/20/2022/1:13:18 PM    Final     Cardiac Studies   limited echo performed yesterday December 20, 2022 Left ventricular ejection fraction, by estimation, is 40 to 45%. The  left ventricle has mildly decreased function. The left ventricle  demonstrates regional wall motion abnormalities (hypokinesis of the  anterior and anteroseptal wall). Left  ventricular diastolic  parameters are indeterminate. Wall motion  abnormality appears new compared to prior study dated 12/23.   2. Right ventricular systolic function is low normal. The right  ventricular size is normal. Tricuspid regurgitation signal is inadequate  for assessing PA pressure.   3. The mitral valve is normal in structure. No evidence of mitral valve  regurgitation. No evidence of mitral stenosis. Moderate mitral annular  calcification.   4. The aortic valve is normal in structure. Aortic valve regurgitation is  not visualized. Aortic valve sclerosis is present, with no evidence of  aortic valve stenosis.   5. The inferior vena cava is normal in size with greater than 50%  respiratory variability, suggesting right atrial pressure of 3 mmHg.   Patient Profile     82 y.o. female with a hx of CHF, HTN, HLD, PVD, DVT s/p left IVC on 10/2022, CKD stage 3, kidney stones, diverticulitis, anemia, gout, hypothyroidism who is being seen 12/19/2022 for the evaluation of elevated troponin    Non-STEMI Mildly decreased ejection fraction on echo with new focal wall motion abnormality, anterior and anteroseptal wall hypokinesis Presenting with severe sepsis, on broad-spectrum antibiotics,  She has completed IV heparin 48 hours Troponin trending downward,  At no point during her presentation or hospital admission did she report having chest pain or shortness of breath concerning for angina Long discussion with patient and husband yesterday afternoon after echocardiogram findings, patient somewhat confused in conversation, husband preferred conservative management with focus on her anemia, general wellbeing, eating  and treatment of sepsis On aspirin, Lipitor Atenolol held secondary to hypotension Off midodrine as blood pressure has stabilized   Septic shock secondary to urinary tract infection Presenting with encephalopathy Recent admission for sepsis with foot infection Hypotension on arrival, treated with IV  fluids, midodrine As blood pressure has stabilized in the treatment of sepsis, we will hold the midodrine   Chronic diastolic CHF Hypotension on arrival, Outpatient medications on hold including Lasix, atenolol, lisinopril  This could be slowly reintroduced as blood pressure stabilizes     Greater than 50% was spent in counseling and coordination of care with patient Total encounter time 50 minutes or more  For questions or updates, please contact [place] Please consult [email or web address] for contact info under        Signed, Ida Rogue, MD  12/21/2022, 2:45 PM

## 2022-12-21 NOTE — Consult Note (Signed)
Pharmacy Antibiotic Note  [name] is a 82 y.o. female with PMH including CHF, HTN, HLD, PVD, DVT s/p IVC filter, CKD, kidney stones, diverticulitis, anemia, gout, hypothyroidism admitted on 12/19/2022 with sepsis / elevated troponin. Source of infection thought to be urine versus bilateral heel wounds. Pharmacy has been consulted for vancomycin dosing.Patient is also ordered ceftriaxone. Renal function stable and at apparent baseline  Plan:  1) continue ceftriaxone 1 gram IV every 24 hours  2) continue vancomycin 1000 mg IV q36H  ---AUC of 524  ---Goal AUC of 400-600 ---Plan for vancomycin level after 3th or 4th dose.   Height: 4' 11"$  (149.9 cm) Weight: 52.6 kg (115 lb 15.4 oz) IBW/kg (Calculated) : 43.2  Temp (24hrs), Avg:98.2 F (36.8 C), Min:97.4 F (36.3 C), Max:98.9 F (37.2 C)  Recent Labs  Lab 12/19/22 0353 12/19/22 0534 12/20/22 0516 12/21/22 0657  WBC 21.7*  --  15.4* 12.6*  CREATININE 0.98  --  0.83  --   LATICACIDVEN 2.8* 2.1*  --   --      Estimated Creatinine Clearance: 40.8 mL/min (by C-G formula based on SCr of 0.83 mg/dL).    Allergies  Allergen Reactions   Other Anaphylaxis    Anesthisia has been a health issue for her in the past.    Sulfa Antibiotics Rash    Antimicrobials this admission: cefepime 2/7 x 1 metronidazole 2/7 x 1 vancomycin 2/7 >>  ceftriaxone 2/7 >>   Microbiology results: 2/7 BCx: E faecalis 2/7 UCx: <10,000 COLONIES/mL INSIGNIFICANT GROWTH  2/7 WoundCx: P mirabilis, E faecalis, S aureus  Thank you for allowing pharmacy to be a part of this patient's care.  Dallie Piles, PharmD, BCPS 12/21/2022 9:18 AM

## 2022-12-21 NOTE — Progress Notes (Signed)
PHARMACY - PHYSICIAN COMMUNICATION CRITICAL VALUE ALERT - BLOOD CULTURE IDENTIFICATION (BCID)  [name] is an 82 y.o. female who presented to [facility] on 12/19/2022 with a chief complaint of confusion and fever  Assessment:  Patient wound heel ulcers, chronic.  1/4 bottles with [facility] BCID detecting E faecalis was previously called.  ID consulted today.  Lab called 2/9 pm reporting GPR in 1 of 4 bottles (same set as enterococcus) 2nd set remains NGTD  Name of physician (or Provider) Contacted: Dr Delaine Lame  Current antibiotics: vancomycin and unasyn  Changes to prescribed antibiotics recommended:  Patient is on recommended antibiotics - No changes needed GRP looks to be contaminant at this time.    Results for orders placed or performed during the hospital encounter of 12/19/22  Blood Culture ID Panel (Reflexed) (Collected: 12/19/2022  3:43 AM)  Result Value Ref Range   Enterococcus faecalis DETECTED (A) NOT DETECTED   Enterococcus Faecium NOT DETECTED NOT DETECTED   Listeria monocytogenes NOT DETECTED NOT DETECTED   Staphylococcus species NOT DETECTED NOT DETECTED   Staphylococcus aureus (BCID) NOT DETECTED NOT DETECTED   Staphylococcus epidermidis NOT DETECTED NOT DETECTED   Staphylococcus lugdunensis NOT DETECTED NOT DETECTED   Streptococcus species NOT DETECTED NOT DETECTED   Streptococcus agalactiae NOT DETECTED NOT DETECTED   Streptococcus pneumoniae NOT DETECTED NOT DETECTED   Streptococcus pyogenes NOT DETECTED NOT DETECTED   A.calcoaceticus-baumannii NOT DETECTED NOT DETECTED   Bacteroides fragilis NOT DETECTED NOT DETECTED   Enterobacterales NOT DETECTED NOT DETECTED   Enterobacter cloacae complex NOT DETECTED NOT DETECTED   Escherichia coli NOT DETECTED NOT DETECTED   Klebsiella aerogenes NOT DETECTED NOT DETECTED   Klebsiella oxytoca NOT DETECTED NOT DETECTED   Klebsiella pneumoniae NOT DETECTED NOT DETECTED   Proteus species NOT DETECTED NOT DETECTED    Salmonella species NOT DETECTED NOT DETECTED   Serratia marcescens NOT DETECTED NOT DETECTED   Haemophilus influenzae NOT DETECTED NOT DETECTED   Neisseria meningitidis NOT DETECTED NOT DETECTED   Pseudomonas aeruginosa NOT DETECTED NOT DETECTED   Stenotrophomonas maltophilia NOT DETECTED NOT DETECTED   Candida albicans NOT DETECTED NOT DETECTED   Candida auris NOT DETECTED NOT DETECTED   Candida glabrata NOT DETECTED NOT DETECTED   Candida krusei NOT DETECTED NOT DETECTED   Candida parapsilosis NOT DETECTED NOT DETECTED   Candida tropicalis NOT DETECTED NOT DETECTED   Cryptococcus neoformans/gattii NOT DETECTED NOT DETECTED   Vancomycin resistance NOT DETECTED NOT DETECTED    Doreene Eland, PharmD, BCPS, BCIDP Work Cell: [phone] 12/21/2022 4:17 PM

## 2022-12-21 NOTE — Progress Notes (Signed)
PHARMACY - PHYSICIAN COMMUNICATION CRITICAL VALUE ALERT - BLOOD CULTURE IDENTIFICATION (BCID)  Results for orders placed or performed during the hospital encounter of 12/19/22  Culture, blood (routine x 2)     Status: None (Preliminary result)   Collection Time: 12/19/22  3:43 AM   Specimen: BLOOD  Result Value Ref Range Status   Specimen Description BLOOD RIGHT [facility]  Final   Special Requests   Final    BOTTLES DRAWN AEROBIC AND ANAEROBIC Blood Culture results may not be optimal due to an excessive volume of blood received in culture bottles   Culture  Setup Time   Final    Organism ID to follow [place] CRITICAL RESULT CALLED TO, READ BACK BY AND VERIFIED WITH: Kamelia Lampkins AT [id number] 12/21/22.PMF Performed at [facility], [address] [place], [place] [address]    Culture [facility] POSITIVE COCCI  Final   Report Status PENDING  Incomplete  Resp panel by RT-PCR (RSV, Flu A&B, Covid) Urine, Catheterized     Status: None   Collection Time: 12/19/22  3:43 AM   Specimen: Urine, Catheterized; Nasal Swab  Result Value Ref Range Status   SARS Coronavirus 2 by RT PCR NEGATIVE NEGATIVE Final    Comment: (NOTE) SARS-CoV-2 target nucleic acids are NOT DETECTED.  The SARS-CoV-2 RNA is generally detectable in upper respiratory specimens during the acute phase of infection. The lowest concentration of SARS-CoV-2 viral copies this assay can detect is 138 copies/mL. A negative result does not preclude SARS-Cov-2 infection and should not be used as the sole basis for treatment or other patient management decisions. A negative result may occur with  improper specimen collection/handling, submission of specimen other than nasopharyngeal swab, presence of viral mutation(s) within the areas targeted by this assay, and inadequate number of viral copies(<138 copies/mL). A negative result must be combined with clinical observations, patient history, and  epidemiological information. The expected result is Negative.  Fact Sheet for Patients:  EntrepreneurPulse.com.au  Fact Sheet for Healthcare Providers:  IncredibleEmployment.be  This test is no t yet approved or cleared by the [place] FDA and  has been authorized for detection and/or diagnosis of SARS-CoV-2 by FDA under an Emergency Use Authorization (EUA). This EUA will remain  in effect (meaning this test can be used) for the duration of the COVID-19 declaration under Section 564(b)(1) of the Act, 21 U.S.C.section 360bbb-3(b)(1), unless the authorization is terminated  or revoked sooner.       Influenza A by PCR NEGATIVE NEGATIVE Final   Influenza B by PCR NEGATIVE NEGATIVE Final    Comment: (NOTE) The Xpert Xpress SARS-CoV-2/FLU/RSV plus assay is intended as an aid in the diagnosis of influenza from Nasopharyngeal swab specimens and should not be used as a sole basis for treatment. Nasal washings and aspirates are unacceptable for Xpert Xpress SARS-CoV-2/FLU/RSV testing.  Fact Sheet for Patients: EntrepreneurPulse.com.au  Fact Sheet for Healthcare Providers: IncredibleEmployment.be  This test is not yet approved or cleared by the [place] FDA and has been authorized for detection and/or diagnosis of SARS-CoV-2 by FDA under an Emergency Use Authorization (EUA). This EUA will remain in effect (meaning this test can be used) for the duration of the COVID-19 declaration under Section 564(b)(1) of the Act, 21 U.S.C. section 360bbb-3(b)(1), unless the authorization is terminated or revoked.     Resp Syncytial Virus by PCR NEGATIVE NEGATIVE Final    Comment: (NOTE) Fact Sheet for Patients: EntrepreneurPulse.com.au  Fact Sheet for Healthcare Providers:  IncredibleEmployment.be  This test is not yet approved or cleared by the [place] and has been  authorized for detection and/or diagnosis of SARS-CoV-2 by FDA under an Emergency Use Authorization (EUA). This EUA will remain in effect (meaning this test can be used) for the duration of the COVID-19 declaration under Section 564(b)(1) of the Act, 21 U.S.C. section 360bbb-3(b)(1), unless the authorization is terminated or revoked.  Performed at [facility], [address], [place], [place] [address]   Urine Culture     Status: Abnormal   Collection Time: 12/19/22  3:43 AM   Specimen: Urine, Catheterized  Result Value Ref Range Status   Specimen Description   Final    URINE, CATHETERIZED Performed at [facility], [place] [address] [place], [place] [address]    Special Requests   Final    NONE Reflexed from [id number] Performed at [facility], [address] [place], [place] [address]    Culture (A)  Final    <10,000 COLONIES/mL INSIGNIFICANT GROWTH Performed at [facility] [address], [place], [place] [address]    Report Status 12/20/2022 FINAL  Final  Blood Culture ID Panel (Reflexed)     Status: Abnormal   Collection Time: 12/19/22  3:43 AM  Result Value Ref Range Status   Enterococcus faecalis DETECTED (A) NOT DETECTED Final    Comment: CRITICAL RESULT CALLED TO, READ BACK BY AND VERIFIED WITH: Nickol Collister AT [id number] 12/21/22.PMF    Enterococcus Faecium NOT DETECTED NOT DETECTED Final   Listeria monocytogenes NOT DETECTED NOT DETECTED Final   Staphylococcus species NOT DETECTED NOT DETECTED Final   Staphylococcus aureus (BCID) NOT DETECTED NOT DETECTED Final   Staphylococcus epidermidis NOT DETECTED NOT DETECTED Final   Staphylococcus lugdunensis NOT DETECTED NOT DETECTED Final   Streptococcus species NOT DETECTED NOT DETECTED Final   Streptococcus agalactiae NOT DETECTED NOT DETECTED Final   Streptococcus pneumoniae NOT DETECTED NOT DETECTED Final   Streptococcus pyogenes NOT DETECTED NOT DETECTED Final   A.calcoaceticus-baumannii NOT  DETECTED NOT DETECTED Final   Bacteroides fragilis NOT DETECTED NOT DETECTED Final   Enterobacterales NOT DETECTED NOT DETECTED Final   Enterobacter cloacae complex NOT DETECTED NOT DETECTED Final   Escherichia coli NOT DETECTED NOT DETECTED Final   Klebsiella aerogenes NOT DETECTED NOT DETECTED Final   Klebsiella oxytoca NOT DETECTED NOT DETECTED Final   Klebsiella pneumoniae NOT DETECTED NOT DETECTED Final   Proteus species NOT DETECTED NOT DETECTED Final   Salmonella species NOT DETECTED NOT DETECTED Final   Serratia marcescens NOT DETECTED NOT DETECTED Final   Haemophilus influenzae NOT DETECTED NOT DETECTED Final   Neisseria meningitidis NOT DETECTED NOT DETECTED Final   Pseudomonas aeruginosa NOT DETECTED NOT DETECTED Final   Stenotrophomonas maltophilia NOT DETECTED NOT DETECTED Final   Candida albicans NOT DETECTED NOT DETECTED Final   Candida auris NOT DETECTED NOT DETECTED Final   Candida glabrata NOT DETECTED NOT DETECTED Final   Candida krusei NOT DETECTED NOT DETECTED Final   Candida parapsilosis NOT DETECTED NOT DETECTED Final   Candida tropicalis NOT DETECTED NOT DETECTED Final   Cryptococcus neoformans/gattii NOT DETECTED NOT DETECTED Final   Vancomycin resistance NOT DETECTED NOT DETECTED Final    Comment: Performed at [facility], [address] [place], [place] [address]  Culture, blood (routine x 2)     Status: None (Preliminary result)   Collection Time: 12/19/22  3:53 AM   Specimen: BLOOD  Result Value Ref Range  Status   Specimen Description BLOOD LEFT FOREARM  Final   Special Requests   Final    BOTTLES DRAWN AEROBIC AND ANAEROBIC Blood Culture results may not be optimal due to an inadequate volume of blood received in culture bottles   Culture   Final    NO GROWTH 2 DAYS Performed at [facility], [address] [place], [place] [address]    Report Status PENDING  Incomplete  Aerobic/Anaerobic Culture w Gram Stain (surgical/deep  wound)     Status: None (Preliminary result)   Collection Time: 12/19/22  5:27 AM   Specimen: Foot  Result Value Ref Range Status   Specimen Description   Final    FOOT Performed at [facility], [address], [place], [place] [address]    Special Requests   Final    NONE Performed at [facility], [address] [place], [place] [address]    Gram Stain   Final    RARE WBC PRESENT, PREDOMINANTLY PMN FEW GRAM POSITIVE COCCI IN PAIRS IN CLUSTERS    Culture   Final    FEW PROTEUS MIRABILIS CULTURE REINCUBATED FOR BETTER GROWTH SUSCEPTIBILITIES TO FOLLOW Performed at [facility], [place] [address], [place], [place] [address]    Report Status PENDING  Incomplete  MRSA Next Gen by PCR, Nasal     Status: None   Collection Time: 12/20/22  5:38 PM   Specimen: Nasal Mucosa; Nasal Swab  Result Value Ref Range Status   MRSA by PCR Next Gen NOT DETECTED NOT DETECTED Final    Comment: (NOTE) The GeneXpert MRSA Assay (FDA approved for NASAL specimens only), is one component of a comprehensive MRSA colonization surveillance program. It is not intended to diagnose MRSA infection nor to guide or monitor treatment for MRSA infections. Test performance is not FDA approved in patients less than 49 years old. Performed at [facility], [address] [place], [place] [address]     BCID Results: 1 (anaerobic) of 4 bottles w/ Enterococcus faecalis, no resistance.  Pt currently on Ceftriaxone (for UTI) and Vancomycin.    Name of provider contacted: B. Rust-Chester, NP   Changes to prescribed antibiotics required: No changes at this time pending additional results.  Renda Rolls, PharmD, [facility] 12/21/2022 6:12 AM

## 2022-12-21 NOTE — Progress Notes (Signed)
PROGRESS NOTE    [name]  [id number] DOB: 1941/06/09 DOA: 12/19/2022 PCP: Tracie Harrier, MD    Brief Narrative:  This 82 y.o. female with medical history significant of dCHF, HTN, HLD, PVD, DVT (s/p left IVC on 11/06/22), CKD-3a, kidney stone, diverticulitis, anemia, gout, hypothyroidism, chronic pressure ulcer in right hip, sacral ulcer, chronic ulcer in both heels of feet, who presents with AMS, fever.    Patient was recently hospitalized from 12/21 - 12/29 due to severe sepsis secondary to foot ulcer with infection.  Pt was seen by Dr. Vickki Muff of podiatry. Pt had left foot ulcer debridement and biopsy of bone of right heel which was negative for osteomyelitis. Patient was also found to have acute left DVT. Pt has left lower extremity thrombectomy and IVC filter placement.    Per her son , pt has been confused in the past several days.  Patient has fever and chills.  Her body temperature is 102.3 in ED.  Does not have cough, shortness breath, respiratory distress.  Per report, patient seems to have some abdominal pain but cannot give detailed information to characterize her abdominal pain.  No active nausea, vomiting, diarrhea noted.  Not sure if patient has symptoms of UTI.  She moves all extremities.   Patient has been intermittently hypotensive in the emergency room.  Initially patient had blood pressure 89/64, which improved to 94/63 after giving 2.5 L normal saline, then dropped to 70/52.  Patient was given 100 mg of Solu-Cortef, 12.5 g of albumin and started midodrine 10 mg 3 times daily.  Blood pressure improved to 100/71. Workup reveals  WBC 21K Trop 2280>2583>2592, UA+, CXR negative.  CT of abdomen/pelvis is negative for acute intra-abdominal issues, negative for hydronephrosis.   CT of head is negative for acute intracranial issue.  X-ray of left foot and right foot negative for bony fracture, no evidence of osteomyelitis.  Admitted for further evaluation, Cardiology  consulted.    Assessment & Plan:   Principal Problem:   Sepsis ([place]) Active Problems:   Lower urinary tract infectious disease   NSTEMI (non-ST elevated myocardial infarction) ([place])   Acute metabolic encephalopathy   Benign essential hypertension   Gout   Chronic diastolic CHF (congestive heart failure) (HCC)   HLD (hyperlipidemia)   Iron deficiency anemia   Acquired hypothyroidism   Type II diabetes mellitus with renal manifestations (HCC)   Chronic kidney disease, stage 3a (HCC)   DVT (deep venous thrombosis) (HCC)   Ulcer of both feet, limited to breakdown of skin (HCC)   Pressure injury of skin_sacrum and right hip   Anemia due to stage 3a chronic kidney disease (HCC)   Leukocytosis   Fever  Severe sepsis due to UTI / Wound infection: Patient met criteria for severe sepsis with WBC 21.7, lactic acid 2.8, 2.1.   Patient has been intermittently hypotensive.   Patient is at high risk of requiring vasopressor.  Initiated on IV Rocephin, vancomycin for wound infection. Blood cultures grew Enterococcus, urine culture no significant growth. Continue IV fluid resuscitation : Totally 3 L IV fluid bolus, then 75 cc/h of normal saline Patient was given 100 mg of Solu-Cortef, 12.5 mg of albumin Continued on Midodrine 10 mg 3 times daily, Discontinued today as BP improved,   NSTEMI: Troponin level 2280, 2583, 2592.  Consulted Cardiology Continue Aspirin, Lipitor, IV heparin Echo showed reduced LVEF with new focal wall motion abnormality. Patient completed IV heparin for 48 hours. Patient denies any chest pain or shortness  of breath during this hospitalization. Husband preferred conservative management with focus on her anemia,  general wellbeing, eating and treatment of sepsis.  Continue medical management.   Acute metabolic encephalopathy:  > Improved. Possibly due to UTI.  CT head negative for acute intra cranial issues. -Frequent neurocheck -Fall precaution. Mentation back to  baseline.  Likely secondary to UTI   Benign essential hypertension Continue IV hydralazine as needed Hold pressure medications due to hypotension   Gout: Patient is not taking allopurinol currently.   Chronic diastolic CHF: 2D echo on 99991111 showed EF of 55 to 60% with grade 1 diastolic dysfunction.   Patient has elevated BNP 719, at high risk of developing CHF exacerbation.   Due to hypotension, will hold off diuretics.  Currently oxygen saturation 94-100% on room air. 2D echo this admission showed reduced LVEF. Once blood pressure improves resume Lasix,  atenolol and lisinopril.   HLD (hyperlipidemia) Continue Lipitor   Iron deficiency anemia:  Hemoglobin stable >  10 .0 S/p 2 unit PRBC.   Acquired hypothyroidism Continue Synthroid   Type 2 diabetes mellitus:  Hb A1c 5.7 recently, well-controlled.   Patient is taking metformin and Januvia at home Continue SSI   CKD Stage IIIa: stable, creatinine 0.98, BUN 20, GFR 59 -Follow-up with BMP   DVT (deep venous thrombosis) (HCC) -s/p of IVC filter   Diabetic foot ulcer (HCC)_bialteral heal:  Difficult to rule out possible infection. Added vancomycin for better gram-positive coverage. -Follow-up blood culture. Wound care consult   Pressure injury of skin_sacrum and right hip -Wound care consult   DVT prophylaxis: Heparin IV. Code Status: Full code Family Communication: No family at bed side. Disposition Plan:   Status is: Inpatient Remains inpatient appropriate because: Admitted for sepsis secondary to UTI,  also found to have elevated troponins,  cardiology is consulted.  Started on IV heparin.   Consultants:  Cardiology  Procedures:None  Antimicrobials:  Anti-infectives (From admission, onward)    Start     Dose/Rate Route Frequency Ordered Stop   12/20/22 1800  vancomycin (VANCOREADY) IVPB 750 mg/150 mL  Status:  Discontinued        750 mg 150 mL/hr over 60 Minutes Intravenous Every 36 hours 12/19/22  1122 12/20/22 1107   12/20/22 1800  vancomycin (VANCOCIN) IVPB 1000 mg/200 mL premix        1,000 mg 200 mL/hr over 60 Minutes Intravenous Every 36 hours 12/20/22 1107     12/19/22 1200  cefTRIAXone (ROCEPHIN) 1 g in sodium chloride 0.9 % 100 mL IVPB        1 g 200 mL/hr over 30 Minutes Intravenous Every 24 hours 12/19/22 1109     12/19/22 0400  ceFEPIme (MAXIPIME) 2 g in sodium chloride 0.9 % 100 mL IVPB        2 g 200 mL/hr over 30 Minutes Intravenous  Once 12/19/22 0350 12/19/22 0459   12/19/22 0400  metroNIDAZOLE (FLAGYL) IVPB 500 mg        500 mg 100 mL/hr over 60 Minutes Intravenous  Once 12/19/22 0350 12/19/22 0610   12/19/22 0400  vancomycin (VANCOCIN) IVPB 1000 mg/200 mL premix        1,000 mg 200 mL/hr over 60 Minutes Intravenous  Once 12/19/22 0350 12/19/22 0703      Subjective: Patient was seen and examined at bedside.  Overnight events noted.   Patient appears improved, pleasantly confused.  Her mental status is at baseline.  Objective: Vitals:   12/21/22 0800 12/21/22 0900  12/21/22 1000 12/21/22 1100  BP: 129/85 (!) 144/104 (!) 144/93 (!) 110/96  Pulse: 76 89 84 (!) 106  Resp: 16 (!) 22 18 (!) 24  Temp: 98.2 F (36.8 C)     TempSrc: Oral     SpO2: 100% 100% 100% 100%  Weight:      Height:        Intake/Output Summary (Last 24 hours) at 12/21/2022 1508 Last data filed at 12/21/2022 1250 Gross per 24 hour  Intake 1488.79 ml  Output 1225 ml  Net 263.79 ml   Filed Weights   12/20/22 1745 12/20/22 1751 12/21/22 0500  Weight: 51.2 kg 51.2 kg 52.6 kg    Examination:  General exam: Appears comfortable, deconditioned, pleasantly confused. Respiratory system: Clear to auscultation. Respiratory effort normal.RR 14 Cardiovascular system: S1 & S2 heard, regular rate and rhythm, no murmur. Gastrointestinal system: Abdomen is soft, non tender, non distended, BS+ Central nervous system: Alert and oriented x 2. No focal neurological deficits. Extremities: Ulcers in  both heels and right ankle. Skin: No rashes, lesions or ulcers Psychiatry: Judgement and insight appear normal. Mood & affect appropriate.     Data Reviewed: I have personally reviewed following labs and imaging studies  CBC: Recent Labs  Lab 12/19/22 0353 12/20/22 0516 12/21/22 0657  WBC 21.7* 15.4* 12.6*  NEUTROABS 18.6*  --   --   HGB 8.9* 7.3* 10.0*  HCT 29.8* 24.0* 32.1*  MCV 79.3* 80.0 81.5  PLT 517* 410* 99991111*   Basic Metabolic Panel: Recent Labs  Lab 12/19/22 0353 12/20/22 0516  NA 136 138  K 3.8 3.6  CL 108 113*  CO2 18* 18*  GLUCOSE 192* 112*  BUN 20 28*  CREATININE 0.98 0.83  CALCIUM 8.1* 7.5*   GFR: Estimated Creatinine Clearance: 40.8 mL/min (by C-G formula based on SCr of 0.83 mg/dL). Liver Function Tests: Recent Labs  Lab 12/19/22 0353  AST 27  ALT 9  ALKPHOS 64  BILITOT 0.7  PROT 7.6  ALBUMIN 2.8*   Recent Labs  Lab 12/19/22 0353  LIPASE 35   No results for input(s): "AMMONIA" in the last 168 hours. Coagulation Profile: Recent Labs  Lab 12/19/22 0756  INR 1.5*   Cardiac Enzymes: No results for input(s): "CKTOTAL", "CKMB", "CKMBINDEX", "TROPONINI" in the last 168 hours. BNP (last 3 results) No results for input(s): "PROBNP" in the last 8760 hours. HbA1C: Recent Labs    12/19/22 0353  HGBA1C 6.1*   CBG: Recent Labs  Lab 12/20/22 1105 12/20/22 1736 12/20/22 2112 12/21/22 0739 12/21/22 1124  GLUCAP 105* 109* 151* 115* 110*   Lipid Profile: Recent Labs    12/20/22 0516  CHOL 136  HDL 22*  LDLCALC 85  TRIG 143  CHOLHDL 6.2   Thyroid Function Tests: No results for input(s): "TSH", "T4TOTAL", "FREET4", "T3FREE", "THYROIDAB" in the last 72 hours. Anemia Panel: No results for input(s): "VITAMINB12", "FOLATE", "FERRITIN", "TIBC", "IRON", "RETICCTPCT" in the last 72 hours. Sepsis Labs: Recent Labs  Lab 12/19/22 0353 12/19/22 0534  PROCALCITON 0.47  --   LATICACIDVEN 2.8* 2.1*    Recent Results (from the past 240  hour(s))  Culture, blood (routine x 2)     Status: None (Preliminary result)   Collection Time: 12/19/22  3:43 AM   Specimen: BLOOD  Result Value Ref Range Status   Specimen Description BLOOD RIGHT [facility]  Final   Special Requests   Final    BOTTLES DRAWN AEROBIC AND ANAEROBIC Blood Culture results may not be optimal  due to an excessive volume of blood received in culture bottles   Culture  Setup Time   Final    Organism ID to follow GRAM POSITIVE COCCI ANAEROBIC BOTTLE ONLY CRITICAL RESULT CALLED TO, READ BACK BY AND VERIFIED WITH: NATHAN BELUE AT [id number] 12/21/22.PMF Performed at [facility], [address] [place], [place] [address]    Culture [facility] POSITIVE COCCI  Final   Report Status PENDING  Incomplete  Resp panel by RT-PCR (RSV, Flu A&B, Covid) Urine, Catheterized     Status: None   Collection Time: 12/19/22  3:43 AM   Specimen: Urine, Catheterized; Nasal Swab  Result Value Ref Range Status   SARS Coronavirus 2 by RT PCR NEGATIVE NEGATIVE Final    Comment: (NOTE) SARS-CoV-2 target nucleic acids are NOT DETECTED.  The SARS-CoV-2 RNA is generally detectable in upper respiratory specimens during the acute phase of infection. The lowest concentration of SARS-CoV-2 viral copies this assay can detect is 138 copies/mL. A negative result does not preclude SARS-Cov-2 infection and should not be used as the sole basis for treatment or other patient management decisions. A negative result may occur with  improper specimen collection/handling, submission of specimen other than nasopharyngeal swab, presence of viral mutation(s) within the areas targeted by this assay, and inadequate number of viral copies(<138 copies/mL). A negative result must be combined with clinical observations, patient history, and epidemiological information. The expected result is Negative.  Fact Sheet for Patients:  EntrepreneurPulse.com.au  Fact Sheet for Healthcare Providers:   IncredibleEmployment.be  This test is no t yet approved or cleared by the [place] FDA and  has been authorized for detection and/or diagnosis of SARS-CoV-2 by FDA under an Emergency Use Authorization (EUA). This EUA will remain  in effect (meaning this test can be used) for the duration of the COVID-19 declaration under Section 564(b)(1) of the Act, 21 U.S.C.section 360bbb-3(b)(1), unless the authorization is terminated  or revoked sooner.       Influenza A by PCR NEGATIVE NEGATIVE Final   Influenza B by PCR NEGATIVE NEGATIVE Final    Comment: (NOTE) The Xpert Xpress SARS-CoV-2/FLU/RSV plus assay is intended as an aid in the diagnosis of influenza from Nasopharyngeal swab specimens and should not be used as a sole basis for treatment. Nasal washings and aspirates are unacceptable for Xpert Xpress SARS-CoV-2/FLU/RSV testing.  Fact Sheet for Patients: EntrepreneurPulse.com.au  Fact Sheet for Healthcare Providers: IncredibleEmployment.be  This test is not yet approved or cleared by the [place] FDA and has been authorized for detection and/or diagnosis of SARS-CoV-2 by FDA under an Emergency Use Authorization (EUA). This EUA will remain in effect (meaning this test can be used) for the duration of the COVID-19 declaration under Section 564(b)(1) of the Act, 21 U.S.C. section 360bbb-3(b)(1), unless the authorization is terminated or revoked.     Resp Syncytial Virus by PCR NEGATIVE NEGATIVE Final    Comment: (NOTE) Fact Sheet for Patients: EntrepreneurPulse.com.au  Fact Sheet for Healthcare Providers: IncredibleEmployment.be  This test is not yet approved or cleared by the [place] FDA and has been authorized for detection and/or diagnosis of SARS-CoV-2 by FDA under an Emergency Use Authorization (EUA). This EUA will remain in effect (meaning this test can be used) for  the duration of the COVID-19 declaration under Section 564(b)(1) of the Act, 21 U.S.C. section 360bbb-3(b)(1), unless the authorization is terminated or revoked.  Performed at [facility], [address] [place], [place] [address]   Urine Culture  Status: None (Preliminary result)   Collection Time: 12/19/22  3:43 AM   Specimen: Urine, Catheterized  Result Value Ref Range Status   Specimen Description   Final    URINE, CATHETERIZED Performed at [place] [address], [place], [place] [address]    Special Requests   Final    NONE Reflexed from [phone] Performed at [facility], [address] [place], [place] [address]    Culture   Final    CULTURE REINCUBATED FOR BETTER GROWTH Performed at [facility], [place] [address], [place], [place] [address]    Report Status PENDING  Incomplete  Blood Culture ID Panel (Reflexed)     Status: Abnormal   Collection Time: 12/19/22  3:43 AM  Result Value Ref Range Status   Enterococcus faecalis DETECTED (A) NOT DETECTED Final    Comment: CRITICAL RESULT CALLED TO, READ BACK BY AND VERIFIED WITH: NATHAN BELUE AT 0556 12/21/22.PMF    Enterococcus Faecium NOT DETECTED NOT DETECTED Final   Listeria monocytogenes NOT DETECTED NOT DETECTED Final   Staphylococcus species NOT DETECTED NOT DETECTED Final   Staphylococcus aureus (BCID) NOT DETECTED NOT DETECTED Final   Staphylococcus epidermidis NOT DETECTED NOT DETECTED Final   Staphylococcus lugdunensis NOT DETECTED NOT DETECTED Final   Streptococcus species NOT DETECTED NOT DETECTED Final   Streptococcus agalactiae NOT DETECTED NOT DETECTED Final   Streptococcus pneumoniae NOT DETECTED NOT DETECTED Final   Streptococcus pyogenes NOT DETECTED NOT DETECTED Final   A.calcoaceticus-baumannii NOT DETECTED NOT DETECTED Final   Bacteroides fragilis NOT DETECTED NOT DETECTED Final   Enterobacterales NOT DETECTED NOT DETECTED Final   Enterobacter cloacae complex  NOT DETECTED NOT DETECTED Final   Escherichia coli NOT DETECTED NOT DETECTED Final   Klebsiella aerogenes NOT DETECTED NOT DETECTED Final   Klebsiella oxytoca NOT DETECTED NOT DETECTED Final   Klebsiella pneumoniae NOT DETECTED NOT DETECTED Final   Proteus species NOT DETECTED NOT DETECTED Final   Salmonella species NOT DETECTED NOT DETECTED Final   Serratia marcescens NOT DETECTED NOT DETECTED Final   Haemophilus influenzae NOT DETECTED NOT DETECTED Final   Neisseria meningitidis NOT DETECTED NOT DETECTED Final   Pseudomonas aeruginosa NOT DETECTED NOT DETECTED Final   Stenotrophomonas maltophilia NOT DETECTED NOT DETECTED Final   Candida albicans NOT DETECTED NOT DETECTED Final   Candida auris NOT DETECTED NOT DETECTED Final   Candida glabrata NOT DETECTED NOT DETECTED Final   Candida krusei NOT DETECTED NOT DETECTED Final   Candida parapsilosis NOT DETECTED NOT DETECTED Final   Candida tropicalis NOT DETECTED NOT DETECTED Final   Cryptococcus neoformans/gattii NOT DETECTED NOT DETECTED Final   Vancomycin resistance NOT DETECTED NOT DETECTED Final    Comment: Performed at [facility], [address] [place], [place] [address]  Culture, blood (routine x 2)     Status: None (Preliminary result)   Collection Time: 12/19/22  3:53 AM   Specimen: BLOOD  Result Value Ref Range Status   Specimen Description BLOOD LEFT FOREARM  Final   Special Requests   Final    BOTTLES DRAWN AEROBIC AND ANAEROBIC Blood Culture results may not be optimal due to an inadequate volume of blood received in culture bottles   Culture   Final    NO GROWTH 2 DAYS Performed at [facility], [address] [place], [place] [address]    Report Status PENDING  Incomplete  Aerobic/Anaerobic Culture w Gram Stain (surgical/deep wound)     Status:  None (Preliminary result)   Collection Time: 12/19/22  5:27 AM   Specimen: Foot  Result Value Ref Range Status   Specimen Description   Final     FOOT Performed at [facility], [address] [place], [place] [address]    Special Requests   Final    NONE Performed at [facility], [address] [place], [place] [address]    Gram Stain   Final    RARE WBC PRESENT, PREDOMINANTLY PMN FEW GRAM POSITIVE COCCI IN PAIRS IN CLUSTERS    Culture   Final    FEW PROTEUS MIRABILIS FEW STAPHYLOCOCCUS AUREUS FEW ENTEROCOCCUS FAECALIS SUSCEPTIBILITIES TO FOLLOW Performed at [facility], [place] [address], [place], [place] [address]    Report Status PENDING  Incomplete   Organism ID, Bacteria PROTEUS MIRABILIS  Final      Susceptibility   Proteus mirabilis - MIC*    AMPICILLIN <=2 SENSITIVE Sensitive     CEFAZOLIN <=4 SENSITIVE Sensitive     CEFEPIME <=0.12 SENSITIVE Sensitive     CEFTAZIDIME <=1 SENSITIVE Sensitive     CEFTRIAXONE <=0.25 SENSITIVE Sensitive     CIPROFLOXACIN <=0.25 SENSITIVE Sensitive     GENTAMICIN <=1 SENSITIVE Sensitive     IMIPENEM 1 SENSITIVE Sensitive     TRIMETH/SULFA <=20 SENSITIVE Sensitive     AMPICILLIN/SULBACTAM <=2 SENSITIVE Sensitive     PIP/TAZO <=4 SENSITIVE Sensitive     * FEW PROTEUS MIRABILIS  MRSA Next Gen by PCR, Nasal     Status: None   Collection Time: 12/20/22  5:38 PM   Specimen: Nasal Mucosa; Nasal Swab  Result Value Ref Range Status   MRSA by PCR Next Gen NOT DETECTED NOT DETECTED Final    Comment: (NOTE) The GeneXpert MRSA Assay (FDA approved for NASAL specimens only), is one component of a comprehensive MRSA colonization surveillance program. It is not intended to diagnose MRSA infection nor to guide or monitor treatment for MRSA infections. Test performance is not FDA approved in patients less than 44 years old. Performed at [facility], [address], [place], [place] [address]     Radiology Studies: ECHOCARDIOGRAM LIMITED  Result Date: 12/20/2022    ECHOCARDIOGRAM LIMITED REPORT   Patient Name:   [name] [facility] Date of Exam: 12/20/2022  Medical Rec #:  [id number]          Height:       59.0 in Accession #:    [id number]         Weight:       109.6 lb Date of Birth:  06-02-1941          BSA:          1.428 m Patient Age:    14 years           BP:           119/73 mmHg Patient Gender: F                  HR:           88 bpm. Exam Location:  [facility] Procedure: Limited Echo Indications:     NSTEMI  History:         Patient has prior history of Echocardiogram examinations, most                  recent 10/31/2022. CHF, Acute MI, PAD,  Signs/Symptoms:Bacteremia; Risk Factors:Hypertension, Diabetes                  and Dyslipidemia. Thrombus, Breast CA.  Sonographer:     Wenda Low Referring Phys:  [id number] Hebert Soho Diagnosing Phys: Ida Rogue MD  Sonographer Comments: Image acquisition challenging due to respiratory motion. IMPRESSIONS  1. Left ventricular ejection fraction, by estimation, is 40 to 45%. The left ventricle has mildly decreased function. The left ventricle demonstrates regional wall motion abnormalities (hypokinesis of the anterior and anteroseptal wall). Left ventricular diastolic parameters are indeterminate. Wall motion abnormality appears new compared to prior study dated 12/23.  2. Right ventricular systolic function is low normal. The right ventricular size is normal. Tricuspid regurgitation signal is inadequate for assessing PA pressure.  3. The mitral valve is normal in structure. No evidence of mitral valve regurgitation. No evidence of mitral stenosis. Moderate mitral annular calcification.  4. The aortic valve is normal in structure. Aortic valve regurgitation is not visualized. Aortic valve sclerosis is present, with no evidence of aortic valve stenosis.  5. The inferior vena cava is normal in size with greater than 50% respiratory variability, suggesting right atrial pressure of 3 mmHg. FINDINGS  Left Ventricle: Left ventricular ejection fraction, by estimation, is 40 to 45%. The left ventricle has  mildly decreased function. The left ventricle demonstrates regional wall motion abnormalities. The left ventricular internal cavity size was normal in size. There is no left ventricular hypertrophy. Left ventricular diastolic parameters are indeterminate. Right Ventricle: The right ventricular size is normal. No increase in right ventricular wall thickness. Right ventricular systolic function is low normal. Tricuspid regurgitation signal is inadequate for assessing PA pressure. Left Atrium: Left atrial size was normal in size. Right Atrium: Right atrial size was normal in size. Pericardium: There is no evidence of pericardial effusion. Mitral Valve: The mitral valve is normal in structure. There is mild calcification of the mitral valve leaflet(s). Moderate mitral annular calcification. No evidence of mitral valve stenosis. Tricuspid Valve: The tricuspid valve is normal in structure. Tricuspid valve regurgitation is not demonstrated. No evidence of tricuspid stenosis. Aortic Valve: The aortic valve is normal in structure. Aortic valve regurgitation is not visualized. Aortic valve sclerosis is present, with no evidence of aortic valve stenosis. Pulmonic Valve: The pulmonic valve was normal in structure. Pulmonic valve regurgitation is not visualized. No evidence of pulmonic stenosis. Aorta: The aortic root is normal in size and structure. Venous: The inferior vena cava is normal in size with greater than 50% respiratory variability, suggesting right atrial pressure of 3 mmHg. IAS/Shunts: No atrial level shunt detected by color flow Doppler. LEFT VENTRICLE PLAX 2D LVIDd:         4.10 cm LVIDs:         2.40 cm LV PW:         1.30 cm LV IVS:        1.00 cm LVOT diam:     1.90 cm LVOT Area:     2.84 cm  LEFT ATRIUM         Index LA diam:    3.80 cm 2.66 cm/m   AORTA Ao Root diam: 2.70 cm  SHUNTS Systemic Diam: 1.90 cm Ida Rogue MD Electronically signed by Ida Rogue MD Signature Date/Time: 12/20/2022/1:13:18 PM     Final     Scheduled Meds:  acetaminophen  650 mg Rectal Once   aspirin EC  81 mg Oral Daily   atorvastatin  40 mg Oral Daily  Chlorhexidine Gluconate Cloth  6 each Topical Daily   insulin aspart  0-5 Units Subcutaneous QHS   insulin aspart  0-9 Units Subcutaneous TID WC   leptospermum manuka honey  1 Application Topical Daily   levothyroxine  88 mcg Oral Daily   sodium chloride flush  10-40 mL Intracatheter Q12H   Continuous Infusions:  sodium chloride 75 mL/hr at 12/21/22 0700   cefTRIAXone (ROCEPHIN)  IV 1 g (12/21/22 1250)   vancomycin Stopped (12/20/22 1950)     LOS: 2 days    Time spent: 35 mins    Claira Jeter, MD [facility]   If 7PM-7AM, please contact night-coverage

## 2022-12-21 NOTE — Progress Notes (Signed)
ANTICOAGULATION CONSULT NOTE  Pharmacy Consult for IV Heparin Indication: ACS/STEMI  Patient Measurements: Height: 4' 11"$  (149.9 cm) Weight: 52.6 kg (115 lb 15.4 oz) IBW/kg (Calculated) : 43.2 Heparin Dosing Weight: 49.7 kg  Labs: Recent Labs    12/19/22 0353 12/19/22 0534 12/19/22 0756 12/19/22 1523 12/19/22 1911 12/20/22 0013 12/20/22 0516 12/20/22 0945 12/20/22 2030 12/21/22 0657 12/21/22 0718  HGB 8.9*  --   --   --   --   --  7.3*  --   --  10.0*  --   HCT 29.8*  --   --   --   --   --  24.0*  --   --  32.1*  --   PLT 517*  --   --   --   --   --  410*  --   --  438*  --   APTT  --   --  113*  --   --   --   --   --   --   --   --   LABPROT  --   --  17.6*  --   --   --   --   --   --   --   --   INR  --   --  1.5*  --   --   --   --   --   --   --   --   HEPARINUNFRC  --   --   --  <0.10*  --    < >  --  0.15* 0.25*  --  0.43  CREATININE 0.98  --   --   --   --   --  0.83  --   --   --   --   TROPONINIHS 2,280*   < > 2,592* 1,991* 1,515*  --   --   --   --   --   --    < > = values in this interval not displayed.    Estimated Creatinine Clearance: 40.8 mL/min (by C-G formula based on SCr of 0.83 mg/dL).  Medical History: Past Medical History:  Diagnosis Date   Arthritis    Arthritis    Breast cancer ([place]) 1992   right breast with lumpectomy and rad tx   Cancer of right breast ([place]) 04/16/2013   right breast with mastectomy   Diabetes mellitus without complication (HCC)    Gout    High cholesterol    Hyperlipidemia    Hypertension    Personal history of radiation therapy    Assessment: Pt is a 82 yo female presenting to ED due to fever, confusion, and abdominal pain, found with elevated troponin I level, trending up. Pharmacy consulted to initiate and manage heparin infusion for suspected ACS.  Goal of Therapy:  anti-Xa level 0.3-0.7 units/ml Monitor platelets by anticoagulation protocol: Yes   Plan: anti-Xa level therapeutic ---continue  heparin  infusion at 1200 units/hr ---Check anti-Xa level in 8 hours and once daily once consecutively therapeutic. ---Continue to monitor H&H and platelets daily while on heparin gtt.  [place] Pharmacist 12/21/2022 8:04 AM

## 2022-12-21 NOTE — Consult Note (Signed)
NAME: [name]  DOB: 1941-01-09  MRN: [id number]  Date/Time: 12/21/2022 10:49 AM  REQUESTING PROVIDER: Dr.Kumar Subjective:  REASON FOR CONSULT: enterococcus bacteremia ? [name] is a 82 y.o. 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 and IVC filter placement 11/06/22, HTN, DM, D CHF, ca breast s/p rt mastectomy, hypothyroidism , presented to the ED from home on 12/19/22 with fever and altered mental status and b/l heel ulcers Hospitalized in Dec 2023 for similar problem and underwent debridement of the rt heel ulcer , no osteo on bone biopsy, also had mechanical thrombectomy for left leg DVT, IVC filter placement Votals in the ED on presentation 12/19/22 155/74, temp 102.3, HR 117, RR 25 and sats 95%  Latest Reference Range & Units 12/19/22 03:53  WBC 4.0 - 10.5 K/uL 21.7 (H)  Hemoglobin 12.0 - 15.0 g/dL 8.9 (L)  HCT 36.0 - 46.0 % 29.8 (L)  Platelets 150 - 400 K/uL 517 (H)  Creatinine 0.44 - 1.00 mg/dL 0.98  Labs revealed leucocytosis, anemia and mild thrombocytosis Blood culture sent and she was started on vanco and ceftriaxone- CT abdomen and pelvis did not reveal any acute findings-showed diverticulosis without inflammation. No hydronephrosis-there was cystocele I am seeing the patient as blood culture positive for enterococcus She has h/o urinary retention and had foley for some time- followed by urology in Dec 2023 and foley was removed then.    Past Medical History:  Diagnosis Date   Arthritis    Arthritis    Breast cancer ([place]) 1992   right breast with lumpectomy and rad tx   Cancer of right breast ([place]) 04/16/2013   right breast with mastectomy   Diabetes mellitus without complication ([place])    Gout    High cholesterol    Hyperlipidemia    Hypertension    Personal history of radiation therapy     Past Surgical History:  Procedure Laterality  Date   ABDOMINAL HYSTERECTOMY     ANKLE FRACTURE SURGERY Right 2004   BREAST LUMPECTOMY Right 1992   positive   IRRIGATION AND DEBRIDEMENT FOOT Right 11/02/2022   Procedure: IRRIGATION AND DEBRIDEMENT RIGHT FOOT AND BONE BIOPSY RIGHT FOOT;  Surgeon: Samara Deist, DPM;  Location: [facility];  Service: Podiatry;  Laterality: Right;   IVC FILTER INSERTION N/A 11/06/2022   Procedure: IVC FILTER INSERTION;  Surgeon: Algernon Huxley, MD;  Location: [facility];  Service: Cardiovascular;  Laterality: N/A;   MASTECTOMY Right 2014   PERIPHERAL VASCULAR THROMBECTOMY Left 11/06/2022   Procedure: PERIPHERAL VASCULAR THROMBECTOMY- EXTERNAL Iliac Vein/CFV;  Surgeon: Algernon Huxley, MD;  Location: [facility];  Service: Cardiovascular;  Laterality: Left;   SHOULDER SURGERY Right 2010    Social History   Socioeconomic History   Marital status: Married    Spouse name: Not on file   Number of children: 2   Years of education: Not on file   Highest education level: Not on file  Occupational History   Not on file  Tobacco Use   Smoking status: Never    Passive exposure: Never   Smokeless tobacco: Never  Vaping Use   Vaping Use: Never used  Substance and Sexual Activity   Alcohol use: No    Alcohol/week: 0.0 standard drinks of alcohol   Drug use: No   Sexual activity: Not Currently  Other Topics Concern   Not on  file  Social History Narrative   Not on file   Social Determinants of Health   Financial Resource Strain: Not on file  Food Insecurity: No Food Insecurity (12/20/2022)   Hunger Vital Sign    Worried About Running Out of Food in the Last Year: Never true    Ran Out of Food in the Last Year: Never true  Transportation Needs: No Transportation Needs (12/20/2022)   PRAPARE - Hydrologist (Medical): No    Lack of Transportation (Non-Medical): No  Physical Activity: Not on file  Stress: Not on file  Social Connections: Not on file  Intimate  Partner Violence: Not At Risk (12/20/2022)   Humiliation, Afraid, Rape, and Kick questionnaire    Fear of Current or Ex-Partner: No    Emotionally Abused: No    Physically Abused: No    Sexually Abused: No    Family History  Problem Relation Age of Onset   Diabetes Father    Breast cancer Neg Hx    Allergies  Allergen Reactions   Other Anaphylaxis    Anesthisia has been a health issue for her in the past.    Sulfa Antibiotics Rash   I? Current Facility-Administered Medications  Medication Dose Route Frequency Provider Last Rate Last Admin   0.9 %  sodium chloride infusion   Intravenous Continuous Ivor Costa, MD 75 mL/hr at 12/21/22 0700 Infusion Verify at 12/21/22 0700   acetaminophen (TYLENOL) suppository 650 mg  650 mg Rectal Once Ward, Kristen N, DO       acetaminophen (TYLENOL) suppository 650 mg  650 mg Rectal Q6H PRN Ivor Costa, MD       acetaminophen (TYLENOL) tablet 650 mg  650 mg Oral Q6H PRN Ivor Costa, MD       aspirin EC tablet 81 mg  81 mg Oral Daily Ivor Costa, MD   81 mg at 12/21/22 1000   atorvastatin (LIPITOR) tablet 40 mg  40 mg Oral Daily Ivor Costa, MD   40 mg at 12/21/22 1000   cefTRIAXone (ROCEPHIN) 1 g in sodium chloride 0.9 % 100 mL IVPB  1 g Intravenous Q24H Ivor Costa, MD   Stopped at 12/20/22 1144   Chlorhexidine Gluconate Cloth 2 % PADS 6 each  6 each Topical Daily Shawna Clamp, MD   6 each at 12/21/22 0959   diphenoxylate-atropine (LOMOTIL) 2.5-0.025 MG per tablet 1-2 tablet  1-2 tablet Oral TID PRN Ivor Costa, MD       hydrALAZINE (APRESOLINE) injection 5 mg  5 mg Intravenous Q2H PRN Ivor Costa, MD       HYDROcodone-acetaminophen (NORCO) 7.5-325 MG per tablet 1 tablet  1 tablet Oral BID PRN Ivor Costa, MD   1 tablet at 12/20/22 1127   insulin aspart (novoLOG) injection 0-5 Units  0-5 Units Subcutaneous QHS Ivor Costa, MD       insulin aspart (novoLOG) injection 0-9 Units  0-9 Units Subcutaneous TID WC Ivor Costa, MD   2 Units at 12/19/22 1650    leptospermum manuka honey ([place]) paste 1 Application  1 Application Topical Daily Ivor Costa, MD   1 Application at [id number]   levothyroxine (SYNTHROID) tablet 88 mcg  88 mcg Oral Daily Ivor Costa, MD   88 mcg at 12/21/22 0558   ondansetron (ZOFRAN) injection 4 mg  4 mg Intravenous Q8H PRN Ivor Costa, MD       sodium chloride flush (NS) 0.9 % injection 10-40 mL  10-40 mL Intracatheter Q12H  Paulette Blanch, MD   10 mL at 12/21/22 1000   sodium chloride flush (NS) 0.9 % injection 10-40 mL  10-40 mL Intracatheter PRN Paulette Blanch, MD       vancomycin (VANCOCIN) IVPB 1000 mg/200 mL premix  1,000 mg Intravenous Q36H Oswald Hillock, RPH   Stopped at 12/20/22 1950     Abtx:  Anti-infectives (From admission, onward)    Start     Dose/Rate Route Frequency Ordered Stop   12/20/22 1800  vancomycin (VANCOREADY) IVPB 750 mg/150 mL  Status:  Discontinued        750 mg 150 mL/hr over 60 Minutes Intravenous Every 36 hours 12/19/22 1122 12/20/22 1107   12/20/22 1800  vancomycin (VANCOCIN) IVPB 1000 mg/200 mL premix        1,000 mg 200 mL/hr over 60 Minutes Intravenous Every 36 hours 12/20/22 1107     12/19/22 1200  cefTRIAXone (ROCEPHIN) 1 g in sodium chloride 0.9 % 100 mL IVPB        1 g 200 mL/hr over 30 Minutes Intravenous Every 24 hours 12/19/22 1109     12/19/22 0400  ceFEPIme (MAXIPIME) 2 g in sodium chloride 0.9 % 100 mL IVPB        2 g 200 mL/hr over 30 Minutes Intravenous  Once 12/19/22 0350 12/19/22 0459   12/19/22 0400  metroNIDAZOLE (FLAGYL) IVPB 500 mg        500 mg 100 mL/hr over 60 Minutes Intravenous  Once 12/19/22 0350 12/19/22 0610   12/19/22 0400  vancomycin (VANCOCIN) IVPB 1000 mg/200 mL premix        1,000 mg 200 mL/hr over 60 Minutes Intravenous  Once 12/19/22 0350 12/19/22 0703       REVIEW OF SYSTEMS:  Const: + fever, negative chills, negative weight loss Eyes: negative diplopia or visual changes, negative eye pain ENT: negative coryza, negative sore throat Resp:  negative cough, hemoptysis, dyspnea Cards: negative for chest pain, palpitations, lower extremity edema GU: negative for frequency, dysuria and hematuria GI: Negative for abdominal pain, diarrhea, bleeding, constipation Skin: negative for rash and pruritus Heme: negative for easy bruising and gum/nose bleeding MS: wheel chair/bed bound  Arthritis hands, feet Psych:  anxiety Endocrine: thyroid, diabetes Allergy/Immunology- sulfa ? VITALS:  BP 128/76   Pulse 79   Temp 98.5 F (36.9 C) (Oral)   Resp 17   Ht 4' 11"$  (1.499 m)   Wt 52.6 kg   SpO2 100%   BMI 23.42 kg/m   PHYSICAL EXAM:  General: Alert, cooperative, no distress, appears stated age. Pale, chronically ill Head: Normocephalic, without obvious abnormality, atraumatic. Eyes: Conjunctivae clear, anicteric sclerae. Pupils are equal ENT Nares normal. No drainage or sinus tenderness. Lips, mucosa, and tongue normal. No Thrush Neck: Supple, symmetrical, no adenopathy, thyroid: non tender no carotid bruit and no JVD. Lungs: b/l air entry- few basal crepts Heart: Tachycardia Abdomen: Soft, non-tender,not distended. Bowel sounds normal. No masses Extremities: b/l heel ulcers- left heel has eschar Also has an ulcer on the rt great toe medial aspect       Skin: No rashes or lesions. Or bruising Lymph: Cervical, supraclavicular normal. Neurologic: Grossly non-focal Ulnar deviation of fingers Sacral decubitus Pertinent Labs Lab Results CBC    Component Value Date/Time   WBC 12.6 (H) 12/21/2022 0657   RBC 3.94 12/21/2022 0657   HGB 10.0 (L) 12/21/2022 0657   HGB 12.0 10/01/2014 1119   HCT 32.1 (L) 12/21/2022 0657   HCT 36.5 10/01/2014  1119   PLT 438 (H) 12/21/2022 0657   PLT 233 10/01/2014 1119   MCV 81.5 12/21/2022 0657   MCV 90 10/01/2014 1119   MCH 25.4 (L) 12/21/2022 0657   MCHC 31.2 12/21/2022 0657   RDW 17.2 (H) 12/21/2022 0657   RDW 14.6 (H) 10/01/2014 1119   LYMPHSABS 2.2 12/19/2022 0353   LYMPHSABS  2.7 10/01/2014 1119   MONOABS 0.6 12/19/2022 0353   MONOABS 0.5 10/01/2014 1119   EOSABS 0.0 12/19/2022 0353   EOSABS 0.1 10/01/2014 1119   BASOSABS 0.1 12/19/2022 0353   BASOSABS 0.1 10/01/2014 1119       Latest Ref Rng & Units 12/20/2022    5:16 AM 12/19/2022    3:53 AM 11/07/2022    6:01 AM  CMP  Glucose 70 - 99 mg/dL 112  192  123   BUN 8 - 23 mg/dL 28  20  16   $ Creatinine 0.44 - 1.00 mg/dL 0.83  0.98  0.79   Sodium 135 - 145 mmol/L 138  136  141   Potassium 3.5 - 5.1 mmol/L 3.6  3.8  3.8   Chloride 98 - 111 mmol/L 113  108  114   CO2 22 - 32 mmol/L 18  18  23   $ Calcium 8.9 - 10.3 mg/dL 7.5  8.1  7.7   Total Protein 6.5 - 8.1 g/dL  7.6    Total Bilirubin 0.3 - 1.2 mg/dL  0.7    Alkaline Phos 38 - 126 U/L  64    AST 15 - 41 U/L  27    ALT 0 - 44 U/L  9        Microbiology: Recent Results (from the past 240 hour(s))  Culture, blood (routine x 2)     Status: None (Preliminary result)   Collection Time: 12/19/22  3:43 AM   Specimen: BLOOD  Result Value Ref Range Status   Specimen Description BLOOD RIGHT AC  Final   Special Requests   Final    BOTTLES DRAWN AEROBIC AND ANAEROBIC Blood Culture results may not be optimal due to an excessive volume of blood received in culture bottles   Culture  Setup Time   Final    Organism ID to follow [place] TO, READ BACK BY AND VERIFIED WITH: NATHAN BELUE AT [place] 12/21/22.PMF Performed at [facility], [address] [place], [place] [address]    Culture [facility] POSITIVE COCCI  Final   Report Status PENDING  Incomplete  Resp panel by RT-PCR (RSV, Flu A&B, Covid) Urine, Catheterized     Status: None   Collection Time: 12/19/22  3:43 AM   Specimen: Urine, Catheterized; Nasal Swab  Result Value Ref Range Status   SARS Coronavirus 2 by RT PCR NEGATIVE NEGATIVE Final    Comment: (NOTE) SARS-CoV-2 target nucleic acids are NOT DETECTED.  The SARS-CoV-2 RNA is generally  detectable in upper respiratory specimens during the acute phase of infection. The lowest concentration of SARS-CoV-2 viral copies this assay can detect is 138 copies/mL. A negative result does not preclude SARS-Cov-2 infection and should not be used as the sole basis for treatment or other patient management decisions. A negative result may occur with  improper specimen collection/handling, submission of specimen other than nasopharyngeal swab, presence of viral mutation(s) within the areas targeted by this assay, and inadequate number of viral copies(<138 copies/mL). A negative result must be combined with clinical observations, patient history, and epidemiological information.  The expected result is Negative.  Fact Sheet for Patients:  EntrepreneurPulse.com.au  Fact Sheet for Healthcare Providers:  IncredibleEmployment.be  This test is no t yet approved or cleared by the [place] FDA and  has been authorized for detection and/or diagnosis of SARS-CoV-2 by FDA under an Emergency Use Authorization (EUA). This EUA will remain  in effect (meaning this test can be used) for the duration of the COVID-19 declaration under Section 564(b)(1) of the Act, 21 U.S.C.section 360bbb-3(b)(1), unless the authorization is terminated  or revoked sooner.       Influenza A by PCR NEGATIVE NEGATIVE Final   Influenza B by PCR NEGATIVE NEGATIVE Final    Comment: (NOTE) The Xpert Xpress SARS-CoV-2/FLU/RSV plus assay is intended as an aid in the diagnosis of influenza from Nasopharyngeal swab specimens and should not be used as a sole basis for treatment. Nasal washings and aspirates are unacceptable for Xpert Xpress SARS-CoV-2/FLU/RSV testing.  Fact Sheet for Patients: EntrepreneurPulse.com.au  Fact Sheet for Healthcare Providers: IncredibleEmployment.be  This test is not yet approved or cleared by the [place] FDA  and has been authorized for detection and/or diagnosis of SARS-CoV-2 by FDA under an Emergency Use Authorization (EUA). This EUA will remain in effect (meaning this test can be used) for the duration of the COVID-19 declaration under Section 564(b)(1) of the Act, 21 U.S.C. section 360bbb-3(b)(1), unless the authorization is terminated or revoked.     Resp Syncytial Virus by PCR NEGATIVE NEGATIVE Final    Comment: (NOTE) Fact Sheet for Patients: EntrepreneurPulse.com.au  Fact Sheet for Healthcare Providers: IncredibleEmployment.be  This test is not yet approved or cleared by the [place] FDA and has been authorized for detection and/or diagnosis of SARS-CoV-2 by FDA under an Emergency Use Authorization (EUA). This EUA will remain in effect (meaning this test can be used) for the duration of the COVID-19 declaration under Section 564(b)(1) of the Act, 21 U.S.C. section 360bbb-3(b)(1), unless the authorization is terminated or revoked.  Performed at [facility], [address], [place], [place] [address]   Urine Culture     Status: Abnormal   Collection Time: 12/19/22  3:43 AM   Specimen: Urine, Catheterized  Result Value Ref Range Status   Specimen Description   Final    URINE, CATHETERIZED Performed at [facility], [address] [place], [place] [address]    Special Requests   Final    NONE Reflexed from [phone] Performed at [facility], [address] [place], [place] [address]    Culture (A)  Final    <10,000 COLONIES/mL INSIGNIFICANT GROWTH Performed at [facility] [address] [place], [place] [address]    Report Status 12/20/2022 FINAL  Final  Blood Culture ID Panel (Reflexed)     Status: Abnormal   Collection Time: 12/19/22  3:43 AM  Result Value Ref Range Status   Enterococcus faecalis DETECTED (A) NOT DETECTED Final    Comment: CRITICAL RESULT CALLED TO, READ BACK BY AND VERIFIED  WITH: NATHAN BELUE AT [id number] 12/21/22.PMF    Enterococcus Faecium NOT DETECTED NOT DETECTED Final   Listeria monocytogenes NOT DETECTED NOT DETECTED Final   Staphylococcus species NOT DETECTED NOT DETECTED Final   Staphylococcus aureus (BCID) NOT DETECTED NOT DETECTED Final   Staphylococcus epidermidis NOT DETECTED NOT DETECTED Final   Staphylococcus lugdunensis NOT DETECTED NOT DETECTED Final   Streptococcus species NOT DETECTED NOT DETECTED Final   Streptococcus agalactiae NOT DETECTED NOT DETECTED Final  Streptococcus pneumoniae NOT DETECTED NOT DETECTED Final   Streptococcus pyogenes NOT DETECTED NOT DETECTED Final   A.calcoaceticus-baumannii NOT DETECTED NOT DETECTED Final   Bacteroides fragilis NOT DETECTED NOT DETECTED Final   Enterobacterales NOT DETECTED NOT DETECTED Final   Enterobacter cloacae complex NOT DETECTED NOT DETECTED Final   Escherichia coli NOT DETECTED NOT DETECTED Final   Klebsiella aerogenes NOT DETECTED NOT DETECTED Final   Klebsiella oxytoca NOT DETECTED NOT DETECTED Final   Klebsiella pneumoniae NOT DETECTED NOT DETECTED Final   Proteus species NOT DETECTED NOT DETECTED Final   Salmonella species NOT DETECTED NOT DETECTED Final   Serratia marcescens NOT DETECTED NOT DETECTED Final   Haemophilus influenzae NOT DETECTED NOT DETECTED Final   Neisseria meningitidis NOT DETECTED NOT DETECTED Final   Pseudomonas aeruginosa NOT DETECTED NOT DETECTED Final   Stenotrophomonas maltophilia NOT DETECTED NOT DETECTED Final   Candida albicans NOT DETECTED NOT DETECTED Final   Candida auris NOT DETECTED NOT DETECTED Final   Candida glabrata NOT DETECTED NOT DETECTED Final   Candida krusei NOT DETECTED NOT DETECTED Final   Candida parapsilosis NOT DETECTED NOT DETECTED Final   Candida tropicalis NOT DETECTED NOT DETECTED Final   Cryptococcus neoformans/gattii NOT DETECTED NOT DETECTED Final   Vancomycin resistance NOT DETECTED NOT DETECTED Final    Comment: Performed at  [facility], [address] [place], [place] [address]  Culture, blood (routine x 2)     Status: None (Preliminary result)   Collection Time: 12/19/22  3:53 AM   Specimen: BLOOD  Result Value Ref Range Status   Specimen Description BLOOD LEFT FOREARM  Final   Special Requests   Final    BOTTLES DRAWN AEROBIC AND ANAEROBIC Blood Culture results may not be optimal due to an inadequate volume of blood received in culture bottles   Culture   Final    NO GROWTH 2 DAYS Performed at [facility], [address], [place], [place] [address]    Report Status PENDING  Incomplete  Aerobic/Anaerobic Culture w Gram Stain (surgical/deep wound)     Status: None (Preliminary result)   Collection Time: 12/19/22  5:27 AM   Specimen: Foot  Result Value Ref Range Status   Specimen Description   Final    FOOT Performed at [facility], [address] [place], [place] [address]    Special Requests   Final    NONE Performed at [facility], [address] [place], [name] [address]    Gram Stain   Final    RARE WBC PRESENT, PREDOMINANTLY PMN FEW GRAM POSITIVE COCCI IN PAIRS IN CLUSTERS    Culture   Final    FEW PROTEUS MIRABILIS CULTURE REINCUBATED FOR BETTER GROWTH SUSCEPTIBILITIES TO FOLLOW Performed at [facility], [address], [place], [place] [address]    Report Status PENDING  Incomplete  MRSA Next Gen by PCR, Nasal     Status: None   Collection Time: 12/20/22  5:38 PM   Specimen: Nasal Mucosa; Nasal Swab  Result Value Ref Range Status   MRSA by PCR Next Gen NOT DETECTED NOT DETECTED Final    Comment: (NOTE) The GeneXpert MRSA Assay (FDA approved for NASAL specimens only), is one component of a comprehensive MRSA colonization surveillance program. It is not intended to diagnose MRSA infection nor to guide or monitor treatment for MRSA infections. Test performance is not FDA approved in patients less than 37 years old. Performed at  [facility],  [place], [place] [address]     IMAGING RESULTS: CT abd and pelvis Cytocele Diverticulosis  I have personally reviewed the films ? Impression/Recommendation 82 yr female presenting with fever and altered mental status  Acute metabolic encephalopathy- resolved  Enterococcus bacteremia 1 of 4. Likely source could be urinary tract. She has cystocele and h/o urinary retention Bladder scan done now showed 700cc of urine and she has been catheterized Will repeat blood cultures ? B/l heel ulcers and sacral decub- sent culture from the heel wound  Pt currently on vanco and ceftriaxone Change ceftriaxone to unasyn  Anemia? ___________________________________________________ Discussed with patient,her nurse  and requesting provider Note:  This document was prepared using Dragon voice recognition software and may include unintentional dictation errors.

## 2022-12-22 DIAGNOSIS — R509 Fever, unspecified: Secondary | ICD-10-CM

## 2022-12-22 DIAGNOSIS — N390 Urinary tract infection, site not specified: Secondary | ICD-10-CM | POA: Diagnosis not present

## 2022-12-22 DIAGNOSIS — D631 Anemia in chronic kidney disease: Secondary | ICD-10-CM

## 2022-12-22 DIAGNOSIS — D509 Iron deficiency anemia, unspecified: Secondary | ICD-10-CM

## 2022-12-22 DIAGNOSIS — A419 Sepsis, unspecified organism: Secondary | ICD-10-CM | POA: Diagnosis not present

## 2022-12-22 DIAGNOSIS — I214 Non-ST elevation (NSTEMI) myocardial infarction: Secondary | ICD-10-CM | POA: Diagnosis not present

## 2022-12-22 DIAGNOSIS — G9341 Metabolic encephalopathy: Secondary | ICD-10-CM

## 2022-12-22 DIAGNOSIS — E1122 Type 2 diabetes mellitus with diabetic chronic kidney disease: Secondary | ICD-10-CM

## 2022-12-22 DIAGNOSIS — N1831 Chronic kidney disease, stage 3a: Secondary | ICD-10-CM | POA: Diagnosis not present

## 2022-12-22 LAB — BASIC METABOLIC PANEL
Anion gap: 7 (ref 5–15)
BUN: 16 mg/dL (ref 8–23)
CO2: 18 mmol/L — ABNORMAL LOW (ref 22–32)
Calcium: 7.8 mg/dL — ABNORMAL LOW (ref 8.9–10.3)
Chloride: 115 mmol/L — ABNORMAL HIGH (ref 98–111)
Creatinine, Ser: 0.65 mg/dL (ref 0.44–1.00)
GFR, Estimated: >60 mL/min (ref >60)
Glucose, Bld: 113 mg/dL — ABNORMAL HIGH (ref 70–99)
Potassium: 3.1 mmol/L — ABNORMAL LOW (ref 3.5–5.1)
Sodium: 140 mmol/L (ref 135–145)

## 2022-12-22 LAB — CBC
HCT: 28.6 % — ABNORMAL LOW (ref 36.0–46.0)
Hemoglobin: 9.1 g/dL — ABNORMAL LOW (ref 12.0–15.0)
MCH: 25.4 pg — ABNORMAL LOW (ref 26.0–34.0)
MCHC: 31.8 g/dL (ref 30.0–36.0)
MCV: 79.9 fL — ABNORMAL LOW (ref 80.0–100.0)
Platelets: 446 10*3/uL — ABNORMAL HIGH (ref 150–400)
RBC: 3.58 MIL/uL — ABNORMAL LOW (ref 3.87–5.11)
RDW: 17.3 % — ABNORMAL HIGH (ref 11.5–15.5)
WBC: 9.6 10*3/uL (ref 4.0–10.5)
nRBC: 0.2 % (ref 0.0–0.2)

## 2022-12-22 LAB — GLUCOSE, CAPILLARY
Glucose-Capillary: 131 mg/dL — ABNORMAL HIGH (ref 70–99)
Glucose-Capillary: 181 mg/dL — ABNORMAL HIGH (ref 70–99)
Glucose-Capillary: 118 mg/dL — ABNORMAL HIGH (ref 70–99)
Glucose-Capillary: 141 mg/dL — ABNORMAL HIGH (ref 70–99)

## 2022-12-22 LAB — AEROBIC/ANAEROBIC CULTURE W GRAM STAIN (SURGICAL/DEEP WOUND)

## 2022-12-22 LAB — PHOSPHORUS: Phosphorus: 1.9 mg/dL — ABNORMAL LOW (ref 2.5–4.6)

## 2022-12-22 LAB — MAGNESIUM: Magnesium: 2 mg/dL (ref 1.7–2.4)

## 2022-12-22 MED ORDER — POTASSIUM CHLORIDE CRYS ER 20 MEQ PO TBCR
40.0000 meq | EXTENDED_RELEASE_TABLET | ORAL | Status: AC
  Administered 2022-12-22 (×2): 40 meq via ORAL
  Filled 2022-12-22 (×2): qty 2

## 2022-12-22 NOTE — Progress Notes (Signed)
Rounding Note    Patient Name: [name] Date of Encounter: 12/22/2022  [place] Cardiologist: new to [facility], [facility]   Subjective   Mild confusion this morning, resting comfortably in bed no complaints Not hungry at this time, food by her bedside Denies significant shortness of breath or chest discomfort Blood pressure stabilized [id number] systolic Received 1 unit blood transfusion yesterday hemoglobin up to 10 Appears heparin has been discontinued  Inpatient Medications    Scheduled Meds:  acetaminophen  650 mg Rectal Once   aspirin EC  81 mg Oral Daily   atorvastatin  40 mg Oral Daily   Chlorhexidine Gluconate Cloth  6 each Topical Daily   insulin aspart  0-5 Units Subcutaneous QHS   insulin aspart  0-9 Units Subcutaneous TID WC   leptospermum manuka honey  1 Application Topical Daily   levothyroxine  88 mcg Oral Daily   potassium chloride  40 mEq Oral Q4H   sodium chloride flush  10-40 mL Intracatheter Q12H   Continuous Infusions:  sodium chloride 75 mL/hr at 12/22/22 1137   ampicillin-sulbactam (UNASYN) IV Stopped (12/22/22 0803)   vancomycin Stopped (12/22/22 0803)   PRN Meds: acetaminophen, acetaminophen, diphenoxylate-atropine, hydrALAZINE, HYDROcodone-acetaminophen, ondansetron (ZOFRAN) IV, sodium chloride flush   Vital Signs    Vitals:   12/22/22 0600 12/22/22 0700 12/22/22 0800 12/22/22 0900  BP: 115/70 120/72 131/70 116/71  Pulse: 78 76 73 73  Resp: 17 19 18 19  $ Temp:      TempSrc:      SpO2: 100% 100% 100% 100%  Weight:      Height:        Intake/Output Summary (Last 24 hours) at 12/22/2022 1306 Last data filed at 12/22/2022 [id number] Gross per 24 hour  Intake 2223.38 ml  Output 1330 ml  Net 893.38 ml      12/21/2022    5:00 AM 12/20/2022    5:51 PM 12/20/2022    5:45 PM  Last 3 Weights  Weight (lbs) 115 lb 15.4 oz 112 lb 14 oz 112 lb 14 oz  Weight (kg) 52.6 kg 51.2 kg 51.2 kg      Telemetry    Normal sinus  rhythm- Personally Reviewed  ECG    - Personally Reviewed  Physical Exam   GEN: No acute distress.   Neck: No JVD Cardiac: RRR, no murmurs, rubs, or gallops.  Respiratory: Clear to auscultation bilaterally. GI: Soft, nontender, non-distended  MS: No edema; No deformity. Neuro:  Nonfocal  Psych: Normal affect   Labs    High Sensitivity Troponin:   Recent Labs  Lab 12/19/22 0353 12/19/22 0534 12/19/22 0756 12/19/22 1523 12/19/22 1911  TROPONINIHS 2,280* 2,583* 2,592* 1,991* 1,515*     Chemistry Recent Labs  Lab 12/19/22 0353 12/20/22 0516 12/22/22 0639  NA 136 138 140  K 3.8 3.6 3.1*  CL 108 113* 115*  CO2 18* 18* 18*  GLUCOSE 192* 112* 113*  BUN 20 28* 16  CREATININE 0.98 0.83 0.65  CALCIUM 8.1* 7.5* 7.8*  MG  --   --  2.0  PROT 7.6  --   --   ALBUMIN 2.8*  --   --   AST 27  --   --   ALT 9  --   --   ALKPHOS 64  --   --   BILITOT 0.7  --   --   GFRNONAA 59* >60 >60  ANIONGAP 10 7 7    $ Lipids  Recent Labs  Lab 12/20/22 0516  CHOL 136  TRIG 143  HDL 22*  LDLCALC 85  CHOLHDL 6.2    Hematology Recent Labs  Lab 12/20/22 0516 12/21/22 0657 12/22/22 0639  WBC 15.4* 12.6* 9.6  RBC 3.00* 3.94 3.58*  HGB 7.3* 10.0* 9.1*  HCT 24.0* 32.1* 28.6*  MCV 80.0 81.5 79.9*  MCH 24.3* 25.4* 25.4*  MCHC 30.4 31.2 31.8  RDW 18.0* 17.2* 17.3*  PLT 410* 438* 446*   Thyroid No results for input(s): "TSH", "FREET4" in the last 168 hours.  BNP Recent Labs  Lab 12/19/22 0756  BNP 719.3*    DDimer No results for input(s): "DDIMER" in the last 168 hours.   Radiology    No results found.  Cardiac Studies   limited echo performed yesterday December 20, 2022 Left ventricular ejection fraction, by estimation, is 40 to 45%. The  left ventricle has mildly decreased function. The left ventricle  demonstrates regional wall motion abnormalities (hypokinesis of the  anterior and anteroseptal wall). Left  ventricular diastolic parameters are indeterminate. Wall  motion  abnormality appears new compared to prior study dated 12/23.   2. Right ventricular systolic function is low normal. The right  ventricular size is normal. Tricuspid regurgitation signal is inadequate  for assessing PA pressure.   3. The mitral valve is normal in structure. No evidence of mitral valve  regurgitation. No evidence of mitral stenosis. Moderate mitral annular  calcification.   4. The aortic valve is normal in structure. Aortic valve regurgitation is  not visualized. Aortic valve sclerosis is present, with no evidence of  aortic valve stenosis.   5. The inferior vena cava is normal in size with greater than 50%  respiratory variability, suggesting right atrial pressure of 3 mmHg.   Patient Profile     82 y.o. female with a hx of CHF, HTN, HLD, PVD, DVT s/p left IVC on 10/2022, CKD stage 3, kidney stones, diverticulitis, anemia, gout, hypothyroidism who is being seen 12/19/2022 for the evaluation of elevated troponin    Non-STEMI In the setting of sepsis, altered mental status, fever Mildly decreased ejection fraction on echo with new focal wall motion abnormality, anterior and anteroseptal wall hypokinesis Presenting with severe sepsis, on broad-spectrum antibiotics,  She has completed IV heparin 48 hours Troponin trending downward,  At no point during her presentation or hospital admission did she report having chest pain or shortness of breath concerning for angina Long discussion with patient and husband on admission concerning echocardiogram findings, patient somewhat confused in conversation, husband preferred conservative management with focus on her anemia, general wellbeing, eating and treatment of sepsis No plan for cardiac catheterization at this time given sepsis, frail On aspirin, Lipitor Atenolol held secondary to hypotension  Septic shock secondary to urinary tract infection Presenting with encephalopathy,  Wounds on lower extremities, hospital mission  last month for sepsis On broad-spectrum antibiotics Hypotension on arrival, treated with IV fluids, midodrine, antibiotics Midodrine weaned off, blood pressure stable Baseline confusion   Chronic diastolic CHF Hypotension on arrival, Outpatient medications on hold including Lasix, atenolol, lisinopril  This could be slowly reintroduced as blood pressure stabilizes Will continue to hold off given poor p.o. intake, borderline low blood pressure likely exacerbated by anorexia, weight loss   Greater than 50% was spent in counseling and coordination of care with patient Total encounter time 50 minutes or more  For questions or updates, please contact [place] Please consult [email or web address] for contact info under  Signed, Ida Rogue, MD  12/22/2022, 1:06 PM

## 2022-12-22 NOTE — Hospital Course (Signed)
[name] is a 82 y.o. female with a history of dCHF, HTN, HLD, PVD, DVT (s/p left IVC on 11/06/22), CKD-3a, kidney stone, diverticulitis, anemia, gout, hypothyroidism, chronic pressure ulcer in right hip, sacral ulcer, chronic ulcer in both heels of feet. Patient presented secondary to altered mental status and fever with concern for sepsis. Hypotension managed with IV fluids successfully with the help of midodrine. Workup revealed enterococcus bacteremia in addition to bacteruria and infected heel ulcer. Hospitalization complicated by development of an NSTEMI. Infectious disease and cardiology consulted for help in management.  2/11: Vitals stable.  Hemoglobin slowly drifting down again, at 8.6 without any obvious bleeding. Per cardiology note husband decided with conservative management for NSTEMI and treatment of sepsis.  ID is recommending continuation of vancomycin and Unasyn.  Enterococcus faecalis bacteremia and wound culture with multiple organisms including Enterococcus faecalis. Initially received heparin infusion which has been discontinued. PT is recommending home health and return back to ALF/IL with husband. Patient is wheelchair-bound for the past many month at baseline.  2/12: Remained hemodynamically stable.  No chest pain.  Home health services ordered. Discussed with infectious disease and patient can be discharged on Augmentin and linezolid for 7 more days.  No need for IV antibiotics as there was no deep seeded infection. A new wheelchair was also ordered.  Prior insurance authorization obtained for [employer].  Patient will continue with rest of her home medications and will continue taking antibiotics along with wound care as mentioned above.  Patient need to have a close follow-up with primary care provider and infectious disease for further recommendations.

## 2022-12-22 NOTE — Progress Notes (Signed)
PROGRESS NOTE    [name]  [id number] DOB: [birth year] DOA: 12/19/2022 PCP: [name] Harrier, MD   Brief Narrative: [name] is a 82 y.o. female with a history of dCHF, HTN, HLD, PVD, DVT (s/p left IVC on 11/06/22), CKD-3a, kidney stone, diverticulitis, anemia, gout, hypothyroidism, chronic pressure ulcer in right hip, sacral ulcer, chronic ulcer in both heels of feet. Patient presented secondary to altered mental status and fever with concern for sepsis. Hypotension managed with IV fluids successfully with the help of midodrine. Workup revealed enterococcus bacteremia in addition to bacteruria and infected heel ulcer. Hospitalization complicated by development of an NSTEMI. Infectious disease and cardiology consulted for help in management.   Assessment and Plan:  Severe sepsis Present on admission. Hypotension responsive to IV fluids. Patient started empirically on Vancomycin and Ceftriaxone for presumed wound infection and UTI source. Blood and urine cultures obtained. Midodrine also started for blood pressure management. Patient avoided ICU admission and midodrine discontinued. Blood cultures significant for enterococcus faecalis, likely from wound infection. Wound cultures significant for Proteus mirabilis, MRSA, Prevotella Bivia and enterococcus faecalis. Urine culture significant for Proteus mirabilis (1,000 colonies/mL).  UTI Only 1,000 colonies/mL noted on urine culture. Patient covered from management of wound infection.  Bilateral heel ulcers Wound culture significant for Proteus mirabilis, MRSA, Prevotella Bivia and enterococcus faecalis. Patient currently managed on Vancomycin and Unasyn  Bacteremia Secondary to enterococcus faecalis, likely from foot wound. ID already consulted. -Continue Ceftriaxone/Unasyn per ID recommendations  NSTEMI Patient with significant troponin elevation and peak of 2,592 with resultant downtrend but no associated chest  pain. Cardiology consulted. Patient started on Heparin IV and continued on aspirin and Lipitor. Patient completed about 48 hours of heparin IV, which was also complicated by slightly worsened anemia. Cardiology with recommendations for conservative management. -Continue aspirin and Lipitor  Acute metabolic encephalopathy Likely secondary to acute illness. Resolved.  Chronic anemia Iron deficiency anemia Acute anemia Baseline hemoglobin of about 8. Hemoglobin of 8.9 on admission with downtrend to 7.3 and resultant 1 unit PRBC transfusion; heparin discontinued at that time. Post-transfusion hemoglobin of 10 with hemoglobin down to 9.1 today. No source of bleeding currently. -CBC in AM  Primary hypertension Antihypertensives held secondary to hypotension on admission.  Gout No flare currently. Stable.  Chronic combined systolic and diastolic heart failure Stable. Transthoracic Echocardiogram this admission with new LVEF of 40-45%. Currently euvolemic.  Hyperlipidemia -Continue Lipitor  Acquired hypothyroidism -Continue Synthroid  Diabetes mellitus type 2 -Continue SSI  CKD stage IIIa Stable.  DVT Not acute. Patient is s/p IVC filter prior to admission.  Pressure injury Mid sacrum, right/left heel. Present on admission.   DVT prophylaxis: SCDs Code Status:   Code Status: Full Code Family Communication: None at bedside Disposition Plan: Discharge pending continued specialist recommendations with need to transition to oral antibiotics   Consultants:  Cardiology Infectious disease  Procedures:  Transthoracic Echocardiogram  Antimicrobials: Vancomycin Cefepime Ceftriaxone Unasyn Flagyl    Subjective: Patient reports no issues this morning. Feels better. She reports not ambulating as an outpatient since January secondary to her left leg wound/pain.  Objective: BP 120/72   Pulse 76   Temp 98.8 F (37.1 C) (Oral)   Resp 19   Ht 4' 11"$  (1.499 m)   Wt 52.6 kg    SpO2 100%   BMI 23.42 kg/m   Examination:  General exam: Appears calm and comfortable Respiratory system: Clear to auscultation. Respiratory effort normal. Cardiovascular system: S1 & S2 heard, RRR. No  murmurs. Gastrointestinal system: Abdomen is nondistended, soft and nontender. Normal bowel sounds heard. Central nervous system: Alert and oriented. Psychiatry: Judgement and insight appear normal. Mood & affect appropriate.    Data Reviewed: I have personally reviewed following labs and imaging studies  CBC Lab Results  Component Value Date   WBC 9.6 12/22/2022   RBC 3.58 (L) 12/22/2022   HGB 9.1 (L) 12/22/2022   HCT 28.6 (L) 12/22/2022   MCV 79.9 (L) 12/22/2022   MCH 25.4 (L) 12/22/2022   PLT 446 (H) 12/22/2022   MCHC 31.8 12/22/2022   RDW 17.3 (H) 12/22/2022   LYMPHSABS 2.2 12/19/2022   MONOABS 0.6 12/19/2022   EOSABS 0.0 12/19/2022   BASOSABS 0.1 [id number]     Last metabolic panel Lab Results  Component Value Date   NA 140 12/22/2022   K 3.1 (L) 12/22/2022   CL 115 (H) 12/22/2022   CO2 18 (L) 12/22/2022   BUN 16 12/22/2022   CREATININE 0.65 12/22/2022   GLUCOSE 113 (H) 12/22/2022   GFRNONAA >60 12/22/2022   GFRAA 54 (L) 12/22/2018   CALCIUM 7.8 (L) 12/22/2022   PHOS 1.9 (L) 12/22/2022   PROT 7.6 12/19/2022   ALBUMIN 2.8 (L) 12/19/2022   LABGLOB 3.2 06/02/2018   BILITOT 0.7 12/19/2022   ALKPHOS 64 12/19/2022   AST 27 12/19/2022   ALT 9 12/19/2022   ANIONGAP 7 12/22/2022    GFR: Estimated Creatinine Clearance: 42.3 mL/min (by C-G formula based on SCr of 0.65 mg/dL).  Recent Results (from the past 240 hour(s))  Culture, blood (routine x 2)     Status: None (Preliminary result)   Collection Time: 12/19/22  3:43 AM   Specimen: BLOOD  Result Value Ref Range Status   Specimen Description   Final    BLOOD RIGHT [facility] Performed at [facility], [address] [place], [place] [address]    Special Requests   Final    BOTTLES DRAWN AEROBIC  AND ANAEROBIC Blood Culture results may not be optimal due to an excessive volume of blood received in culture bottles Performed at [facility], [address] [place], [place] [address]    Culture  Setup Time   Final    GRAM POSITIVE COCCI ANAEROBIC BOTTLE ONLY CRITICAL RESULT CALLED TO, READ BACK BY AND VERIFIED WITH: NATHAN BELUE AT [place] 12/21/22.PMF GRAM POSITIVE RODS AEROBIC BOTTLE ONLY CRITICAL RESULT CALLED TO, READ BACK BY AND VERIFIED WITH: Nilsa Nutting AT [id number] 12/21/22.PMF Performed at [facility], [place] [address], [place], [place] [address]    Culture GRAM POSITIVE COCCI GRAM POSITIVE RODS   Final   Report Status PENDING  Incomplete  Resp panel by RT-PCR (RSV, Flu A&B, Covid) Urine, Catheterized     Status: None   Collection Time: 12/19/22  3:43 AM   Specimen: Urine, Catheterized; Nasal Swab  Result Value Ref Range Status   SARS Coronavirus 2 by RT PCR NEGATIVE NEGATIVE Final    Comment: (NOTE) SARS-CoV-2 target nucleic acids are NOT DETECTED.  The SARS-CoV-2 RNA is generally detectable in upper respiratory specimens during the acute phase of infection. The lowest concentration of SARS-CoV-2 viral copies this assay can detect is 138 copies/mL. A negative result does not preclude SARS-Cov-2 infection and should not be used as the sole basis for treatment or other patient management decisions. A negative result may occur with  improper specimen collection/handling, submission of specimen other than nasopharyngeal swab, presence of viral mutation(s) within the areas targeted by this  assay, and inadequate number of viral copies(<138 copies/mL). A negative result must be combined with clinical observations, patient history, and epidemiological information. The expected result is Negative.  Fact Sheet for Patients:  EntrepreneurPulse.com.au  Fact Sheet for Healthcare Providers:  IncredibleEmployment.be  This test is no  t yet approved or cleared by the [place] FDA and  has been authorized for detection and/or diagnosis of SARS-CoV-2 by FDA under an Emergency Use Authorization (EUA). This EUA will remain  in effect (meaning this test can be used) for the duration of the COVID-19 declaration under Section 564(b)(1) of the Act, 21 U.S.C.section 360bbb-3(b)(1), unless the authorization is terminated  or revoked sooner.       Influenza A by PCR NEGATIVE NEGATIVE Final   Influenza B by PCR NEGATIVE NEGATIVE Final    Comment: (NOTE) The Xpert Xpress SARS-CoV-2/FLU/RSV plus assay is intended as an aid in the diagnosis of influenza from Nasopharyngeal swab specimens and should not be used as a sole basis for treatment. Nasal washings and aspirates are unacceptable for Xpert Xpress SARS-CoV-2/FLU/RSV testing.  Fact Sheet for Patients: EntrepreneurPulse.com.au  Fact Sheet for Healthcare Providers: IncredibleEmployment.be  This test is not yet approved or cleared by the [place] FDA and has been authorized for detection and/or diagnosis of SARS-CoV-2 by FDA under an Emergency Use Authorization (EUA). This EUA will remain in effect (meaning this test can be used) for the duration of the COVID-19 declaration under Section 564(b)(1) of the Act, 21 U.S.C. section 360bbb-3(b)(1), unless the authorization is terminated or revoked.     Resp Syncytial Virus by PCR NEGATIVE NEGATIVE Final    Comment: (NOTE) Fact Sheet for Patients: EntrepreneurPulse.com.au  Fact Sheet for Healthcare Providers: IncredibleEmployment.be  This test is not yet approved or cleared by the [place] FDA and has been authorized for detection and/or diagnosis of SARS-CoV-2 by FDA under an Emergency Use Authorization (EUA). This EUA will remain in effect (meaning this test can be used) for the duration of the COVID-19 declaration under Section  564(b)(1) of the Act, 21 U.S.C. section 360bbb-3(b)(1), unless the authorization is terminated or revoked.  Performed at [facility], [address] [place], [place] [address]   Urine Culture     Status: None (Preliminary result)   Collection Time: 12/19/22  3:43 AM   Specimen: Urine, Catheterized  Result Value Ref Range Status   Specimen Description   Final    URINE, CATHETERIZED Performed at [facility], [place] [address] [place], [place] [address]    Special Requests   Final    NONE Reflexed from [phone] Performed at [facility], [address] [place], [place] [address]    Culture   Final    CULTURE REINCUBATED FOR BETTER GROWTH Performed at [facility], [place] [address] [place], [place] [address]    Report Status PENDING  Incomplete  Blood Culture ID Panel (Reflexed)     Status: Abnormal   Collection Time: 12/19/22  3:43 AM  Result Value Ref Range Status   Enterococcus faecalis DETECTED (A) NOT DETECTED Final    Comment: CRITICAL RESULT CALLED TO, READ BACK BY AND VERIFIED WITH: NATHAN BELUE AT 0556 12/21/22.PMF    Enterococcus Faecium NOT DETECTED NOT DETECTED Final   Listeria monocytogenes NOT DETECTED NOT DETECTED Final   Staphylococcus species NOT DETECTED NOT DETECTED Final   Staphylococcus aureus (BCID) NOT DETECTED NOT DETECTED Final   Staphylococcus epidermidis NOT DETECTED NOT DETECTED Final   Staphylococcus lugdunensis  NOT DETECTED NOT DETECTED Final   Streptococcus species NOT DETECTED NOT DETECTED Final   Streptococcus agalactiae NOT DETECTED NOT DETECTED Final   Streptococcus pneumoniae NOT DETECTED NOT DETECTED Final   Streptococcus pyogenes NOT DETECTED NOT DETECTED Final   A.calcoaceticus-baumannii NOT DETECTED NOT DETECTED Final   Bacteroides fragilis NOT DETECTED NOT DETECTED Final   Enterobacterales NOT DETECTED NOT DETECTED Final   Enterobacter cloacae complex NOT DETECTED NOT DETECTED Final   Escherichia coli NOT  DETECTED NOT DETECTED Final   Klebsiella aerogenes NOT DETECTED NOT DETECTED Final   Klebsiella oxytoca NOT DETECTED NOT DETECTED Final   Klebsiella pneumoniae NOT DETECTED NOT DETECTED Final   Proteus species NOT DETECTED NOT DETECTED Final   Salmonella species NOT DETECTED NOT DETECTED Final   Serratia marcescens NOT DETECTED NOT DETECTED Final   Haemophilus influenzae NOT DETECTED NOT DETECTED Final   Neisseria meningitidis NOT DETECTED NOT DETECTED Final   Pseudomonas aeruginosa NOT DETECTED NOT DETECTED Final   Stenotrophomonas maltophilia NOT DETECTED NOT DETECTED Final   Candida albicans NOT DETECTED NOT DETECTED Final   Candida auris NOT DETECTED NOT DETECTED Final   Candida glabrata NOT DETECTED NOT DETECTED Final   Candida krusei NOT DETECTED NOT DETECTED Final   Candida parapsilosis NOT DETECTED NOT DETECTED Final   Candida tropicalis NOT DETECTED NOT DETECTED Final   Cryptococcus neoformans/gattii NOT DETECTED NOT DETECTED Final   Vancomycin resistance NOT DETECTED NOT DETECTED Final    Comment: Performed at [facility], [address] [place], [place] [address]  Culture, blood (routine x 2)     Status: None (Preliminary result)   Collection Time: 12/19/22  3:53 AM   Specimen: BLOOD  Result Value Ref Range Status   Specimen Description BLOOD LEFT FOREARM  Final   Special Requests   Final    BOTTLES DRAWN AEROBIC AND ANAEROBIC Blood Culture results may not be optimal due to an inadequate volume of blood received in culture bottles   Culture   Final    NO GROWTH 3 DAYS Performed at [facility], [address] [place], [place] [address]    Report Status PENDING  Incomplete  Aerobic/Anaerobic Culture w Gram Stain (surgical/deep wound)     Status: None (Preliminary result)   Collection Time: 12/19/22  5:27 AM   Specimen: Foot  Result Value Ref Range Status   Specimen Description   Final    FOOT Performed at [facility], [address], [place], [place] [address]    Special Requests   Final    NONE Performed at [facility], [address] [place], [place] [address]    Gram Stain   Final    RARE WBC PRESENT, PREDOMINANTLY PMN FEW GRAM POSITIVE COCCI IN PAIRS IN CLUSTERS    Culture   Final    FEW PROTEUS MIRABILIS FEW STAPHYLOCOCCUS AUREUS FEW ENTEROCOCCUS FAECALIS SUSCEPTIBILITIES TO FOLLOW Performed at [facility], [address], [place], [place] [address]    Report Status PENDING  Incomplete   Organism ID, Bacteria PROTEUS MIRABILIS  Final      Susceptibility   Proteus mirabilis - MIC*    AMPICILLIN <=2 SENSITIVE Sensitive     CEFAZOLIN <=4 SENSITIVE Sensitive     CEFEPIME <=0.12 SENSITIVE Sensitive     CEFTAZIDIME <=1 SENSITIVE Sensitive     CEFTRIAXONE <=0.25 SENSITIVE Sensitive     CIPROFLOXACIN <=0.25 SENSITIVE Sensitive     GENTAMICIN <=1 SENSITIVE Sensitive  IMIPENEM 1 SENSITIVE Sensitive     TRIMETH/SULFA <=20 SENSITIVE Sensitive     AMPICILLIN/SULBACTAM <=2 SENSITIVE Sensitive     PIP/TAZO <=4 SENSITIVE Sensitive     * FEW PROTEUS MIRABILIS  MRSA Next Gen by PCR, Nasal     Status: None   Collection Time: 12/20/22  5:38 PM   Specimen: Nasal Mucosa; Nasal Swab  Result Value Ref Range Status   MRSA by PCR Next Gen NOT DETECTED NOT DETECTED Final    Comment: (NOTE) The GeneXpert MRSA Assay (FDA approved for NASAL specimens only), is one component of a comprehensive MRSA colonization surveillance program. It is not intended to diagnose MRSA infection nor to guide or monitor treatment for MRSA infections. Test performance is not FDA approved in patients less than 63 years old. Performed at [facility], [address], [place],  [address]       Radiology Studies: ECHOCARDIOGRAM LIMITED  Result Date: 12/20/2022    ECHOCARDIOGRAM LIMITED REPORT   Patient Name:   SHERMANE RAUSER [facility] Date of Exam: 12/20/2022 Medical Rec #:  [id number]          Height:       59.0  in Accession #:    [id number]         Weight:       109.6 lb Date of Birth:  07/07/1941          BSA:          1.428 m Patient Age:    38 years           BP:           119/73 mmHg Patient Gender: F                  HR:           88 bpm. Exam Location:  [facility] Procedure: Limited Echo Indications:     NSTEMI  History:         Patient has prior history of Echocardiogram examinations, most                  recent 10/31/2022. CHF, Acute MI, PAD,                  Signs/Symptoms:Bacteremia; Risk Factors:Hypertension, Diabetes                  and Dyslipidemia. Thrombus, Breast CA.  Sonographer:     Wenda Low Referring Phys:  [id number] Hebert Soho Diagnosing Phys: Ida Rogue MD  Sonographer Comments: Image acquisition challenging due to respiratory motion. IMPRESSIONS  1. Left ventricular ejection fraction, by estimation, is 40 to 45%. The left ventricle has mildly decreased function. The left ventricle demonstrates regional wall motion abnormalities (hypokinesis of the anterior and anteroseptal wall). Left ventricular diastolic parameters are indeterminate. Wall motion abnormality appears new compared to prior study dated 12/23.  2. Right ventricular systolic function is low normal. The right ventricular size is normal. Tricuspid regurgitation signal is inadequate for assessing PA pressure.  3. The mitral valve is normal in structure. No evidence of mitral valve regurgitation. No evidence of mitral stenosis. Moderate mitral annular calcification.  4. The aortic valve is normal in structure. Aortic valve regurgitation is not visualized. Aortic valve sclerosis is present, with no evidence of aortic valve stenosis.  5. The inferior vena cava is normal in size with greater than 50% respiratory variability, suggesting right atrial pressure of 3 mmHg. FINDINGS  Left Ventricle: Left ventricular  ejection fraction, by estimation, is 40 to 45%. The left ventricle has mildly decreased function. The left ventricle  demonstrates regional wall motion abnormalities. The left ventricular internal cavity size was normal in size. There is no left ventricular hypertrophy. Left ventricular diastolic parameters are indeterminate. Right Ventricle: The right ventricular size is normal. No increase in right ventricular wall thickness. Right ventricular systolic function is low normal. Tricuspid regurgitation signal is inadequate for assessing PA pressure. Left Atrium: Left atrial size was normal in size. Right Atrium: Right atrial size was normal in size. Pericardium: There is no evidence of pericardial effusion. Mitral Valve: The mitral valve is normal in structure. There is mild calcification of the mitral valve leaflet(s). Moderate mitral annular calcification. No evidence of mitral valve stenosis. Tricuspid Valve: The tricuspid valve is normal in structure. Tricuspid valve regurgitation is not demonstrated. No evidence of tricuspid stenosis. Aortic Valve: The aortic valve is normal in structure. Aortic valve regurgitation is not visualized. Aortic valve sclerosis is present, with no evidence of aortic valve stenosis. Pulmonic Valve: The pulmonic valve was normal in structure. Pulmonic valve regurgitation is not visualized. No evidence of pulmonic stenosis. Aorta: The aortic root is normal in size and structure. Venous: The inferior vena cava is normal in size with greater than 50% respiratory variability, suggesting right atrial pressure of 3 mmHg. IAS/Shunts: No atrial level shunt detected by color flow Doppler. LEFT VENTRICLE PLAX 2D LVIDd:         4.10 cm LVIDs:         2.40 cm LV PW:         1.30 cm LV IVS:        1.00 cm LVOT diam:     1.90 cm LVOT Area:     2.84 cm  LEFT ATRIUM         Index LA diam:    3.80 cm 2.66 cm/m   AORTA Ao Root diam: 2.70 cm  SHUNTS Systemic Diam: 1.90 cm Ida Rogue MD Electronically signed by Ida Rogue MD Signature Date/Time: 12/20/2022/1:13:18 PM    Final       LOS: 3 days    Cordelia Poche, MD [facility] 12/22/2022, 8:52 AM   If 7PM-7AM, please contact night-coverage [email or web address]

## 2022-12-23 DIAGNOSIS — I214 Non-ST elevation (NSTEMI) myocardial infarction: Secondary | ICD-10-CM | POA: Diagnosis not present

## 2022-12-23 DIAGNOSIS — A419 Sepsis, unspecified organism: Secondary | ICD-10-CM | POA: Diagnosis not present

## 2022-12-23 DIAGNOSIS — D72829 Elevated white blood cell count, unspecified: Secondary | ICD-10-CM

## 2022-12-23 DIAGNOSIS — L97511 Non-pressure chronic ulcer of other part of right foot limited to breakdown of skin: Secondary | ICD-10-CM

## 2022-12-23 DIAGNOSIS — L97521 Non-pressure chronic ulcer of other part of left foot limited to breakdown of skin: Secondary | ICD-10-CM

## 2022-12-23 DIAGNOSIS — R509 Fever, unspecified: Secondary | ICD-10-CM | POA: Diagnosis not present

## 2022-12-23 DIAGNOSIS — E1122 Type 2 diabetes mellitus with diabetic chronic kidney disease: Secondary | ICD-10-CM | POA: Diagnosis not present

## 2022-12-23 LAB — CBC
HCT: 27.6 % — ABNORMAL LOW (ref 36.0–46.0)
Hemoglobin: 8.6 g/dL — ABNORMAL LOW (ref 12.0–15.0)
MCH: 24.9 pg — ABNORMAL LOW (ref 26.0–34.0)
MCHC: 31.2 g/dL (ref 30.0–36.0)
MCV: 79.8 fL — ABNORMAL LOW (ref 80.0–100.0)
Platelets: 393 10*3/uL (ref 150–400)
RBC: 3.46 MIL/uL — ABNORMAL LOW (ref 3.87–5.11)
RDW: 17.8 % — ABNORMAL HIGH (ref 11.5–15.5)
WBC: 10.9 10*3/uL — ABNORMAL HIGH (ref 4.0–10.5)
nRBC: 0 % (ref 0.0–0.2)

## 2022-12-23 LAB — GLUCOSE, CAPILLARY
Glucose-Capillary: 127 mg/dL — ABNORMAL HIGH (ref 70–99)
Glucose-Capillary: 170 mg/dL — ABNORMAL HIGH (ref 70–99)
Glucose-Capillary: 128 mg/dL — ABNORMAL HIGH (ref 70–99)
Glucose-Capillary: 147 mg/dL — ABNORMAL HIGH (ref 70–99)

## 2022-12-23 LAB — BASIC METABOLIC PANEL
Anion gap: 5 (ref 5–15)
BUN: 13 mg/dL (ref 8–23)
CO2: 18 mmol/L — ABNORMAL LOW (ref 22–32)
Calcium: 7.6 mg/dL — ABNORMAL LOW (ref 8.9–10.3)
Chloride: 114 mmol/L — ABNORMAL HIGH (ref 98–111)
Creatinine, Ser: 0.62 mg/dL (ref 0.44–1.00)
GFR, Estimated: >60 mL/min (ref >60)
Glucose, Bld: 153 mg/dL — ABNORMAL HIGH (ref 70–99)
Potassium: 4.1 mmol/L (ref 3.5–5.1)
Sodium: 137 mmol/L (ref 135–145)

## 2022-12-23 LAB — URINE CULTURE: Culture: 1000 — AB

## 2022-12-23 LAB — CULTURE, BLOOD (ROUTINE X 2)

## 2022-12-23 MED ORDER — ATENOLOL 25 MG PO TABS
25.0000 mg | ORAL_TABLET | Freq: Every day | ORAL | Status: DC
  Administered 2022-12-23 – 2022-12-24 (×2): 25 mg via ORAL
  Filled 2022-12-23 (×2): qty 1

## 2022-12-23 MED ORDER — LISINOPRIL 5 MG PO TABS
5.0000 mg | ORAL_TABLET | Freq: Every day | ORAL | Status: DC
  Administered 2022-12-23 – 2022-12-24 (×2): 5 mg via ORAL
  Filled 2022-12-23 (×2): qty 1

## 2022-12-23 NOTE — Progress Notes (Signed)
Rounding Note    Patient Name: [name] Date of Encounter: 12/23/2022  [place] Cardiologist: new to [facility] feeling well, husband at the bedside Getting ready to eat her lunch, sitting up in recliner No chest pain, no shortness of breath, reports she is feeling stronger Blood cultures positive for Enterococcus facialis  Inpatient Medications    Scheduled Meds:  acetaminophen  650 mg Rectal Once   aspirin EC  81 mg Oral Daily   atorvastatin  40 mg Oral Daily   Chlorhexidine Gluconate Cloth  6 each Topical Daily   insulin aspart  0-5 Units Subcutaneous QHS   insulin aspart  0-9 Units Subcutaneous TID WC   leptospermum manuka honey  1 Application Topical Daily   levothyroxine  88 mcg Oral Daily   sodium chloride flush  10-40 mL Intracatheter Q12H   Continuous Infusions:  sodium chloride 75 mL/hr at 12/23/22 0921   ampicillin-sulbactam (UNASYN) IV 3 g (12/23/22 1320)   vancomycin Stopped (12/22/22 0803)   PRN Meds: acetaminophen, acetaminophen, diphenoxylate-atropine, hydrALAZINE, HYDROcodone-acetaminophen, ondansetron (ZOFRAN) IV, sodium chloride flush   Vital Signs    Vitals:   12/22/22 2300 12/22/22 2330 12/22/22 2348 12/23/22 0831  BP: 129/80  (!) 142/88 134/84  Pulse: 80  (!) 53 89  Resp: 17  16 18  $ Temp:   97.8 F (36.6 C) 99.5 F (37.5 C)  TempSrc:   Oral   SpO2: 100%  91% 98%  Weight:  54.5 kg    Height:        Intake/Output Summary (Last 24 hours) at 12/23/2022 1429 Last data filed at 12/23/2022 1240 Gross per 24 hour  Intake 1531.85 ml  Output 2450 ml  Net -918.15 ml      12/22/2022   11:30 PM 12/21/2022    5:00 AM 12/20/2022    5:51 PM  Last 3 Weights  Weight (lbs) 120 lb 2.4 oz 115 lb 15.4 oz 112 lb 14 oz  Weight (kg) 54.5 kg 52.6 kg 51.2 kg      Telemetry     - Personally Reviewed  ECG     - Personally Reviewed  Physical Exam   GEN: No acute distress.,  Thin, weak Neck: No JVD Cardiac:  RRR, no murmurs, rubs, or gallops.  Respiratory: Clear to auscultation bilaterally. GI: Soft, nontender, non-distended  MS: No edema; No deformity. Neuro:  Nonfocal  Psych: Normal affect   Labs    High Sensitivity Troponin:   Recent Labs  Lab 12/19/22 0353 12/19/22 0534 12/19/22 0756 12/19/22 1523 12/19/22 1911  TROPONINIHS 2,280* 2,583* 2,592* 1,991* 1,515*     Chemistry Recent Labs  Lab 12/19/22 0353 12/20/22 0516 12/22/22 0639 12/23/22 0356  NA 136 138 140 137  K 3.8 3.6 3.1* 4.1  CL 108 113* 115* 114*  CO2 18* 18* 18* 18*  GLUCOSE 192* 112* 113* 153*  BUN 20 28* 16 13  CREATININE 0.98 0.83 0.65 0.62  CALCIUM 8.1* 7.5* 7.8* 7.6*  MG  --   --  2.0  --   PROT 7.6  --   --   --   ALBUMIN 2.8*  --   --   --   AST 27  --   --   --   ALT 9  --   --   --   ALKPHOS 64  --   --   --   BILITOT 0.7  --   --   --  GFRNONAA 59* >60 >60 >60  ANIONGAP 10 7 7 5    $ Lipids  Recent Labs  Lab 12/20/22 0516  CHOL 136  TRIG 143  HDL 22*  LDLCALC 85  CHOLHDL 6.2    Hematology Recent Labs  Lab 12/21/22 0657 12/22/22 0639 12/23/22 0356  WBC 12.6* 9.6 10.9*  RBC 3.94 3.58* 3.46*  HGB 10.0* 9.1* 8.6*  HCT 32.1* 28.6* 27.6*  MCV 81.5 79.9* 79.8*  MCH 25.4* 25.4* 24.9*  MCHC 31.2 31.8 31.2  RDW 17.2* 17.3* 17.8*  PLT 438* 446* 393   Thyroid No results for input(s): "TSH", "FREET4" in the last 168 hours.  BNP Recent Labs  Lab 12/19/22 0756  BNP 719.3*    DDimer No results for input(s): "DDIMER" in the last 168 hours.   Radiology    No results found.  Cardiac Studies   Echocardiogram Left ventricular ejection fraction, by estimation, is 40 to 45%. The  left ventricle has mildly decreased function. The left ventricle  demonstrates regional wall motion abnormalities (hypokinesis of the  anterior and anteroseptal wall). Left  ventricular diastolic parameters are indeterminate. Wall motion  abnormality appears new compared to prior study dated 12/23.   2.  Right ventricular systolic function is low normal. The right  ventricular size is normal. Tricuspid regurgitation signal is inadequate  for assessing PA pressure.   3. The mitral valve is normal in structure. No evidence of mitral valve  regurgitation. No evidence of mitral stenosis. Moderate mitral annular  calcification.   4. The aortic valve is normal in structure. Aortic valve regurgitation is  not visualized. Aortic valve sclerosis is present, with no evidence of  aortic valve stenosis.   5. The inferior vena cava is normal in size with greater than 50%  respiratory variability, suggesting right atrial pressure of 3 mmHg.   Patient Profile     82 y.o. female with a hx of CHF, HTN, HLD, PVD, DVT s/p left IVC on 10/2022, CKD stage 3, kidney stones, diverticulitis, anemia, gout, hypothyroidism who is being seen 12/19/2022 for the evaluation of elevated troponin     Assessment & Plan    Non-STEMI Mildly decreased ejection fraction on echo 40 to 45% with new focal wall motion abnormality, anterior and anteroseptal wall hypokinesis Presenting with severe sepsis, on broad-spectrum antibiotics, blood cultures positive for Enterococcus facialis  completed IV heparin 48 hours Troponin trending downward, no anginal symptoms on arrival or during her hospital course -Several long discussions with patient and husband, conservative management decided upon given her anorexia, weakness, sepsis We will hold off on cardiac catheterization at this time Recommend we continue aspirin, Lipitor Atenolol held secondary to hypotension Off midodrine as blood pressure has stabilized   Septic shock secondary to wound infections Presenting with encephalopathy Recent admission for sepsis with foot infection Blood cultures positive Enterococcus facialis Hypotension on arrival, treated with IV fluids, midodrine -Midodrine on hold as infection has improved   Chronic diastolic CHF -Outpatient medications on  hold on arrival in the setting of sepsis Will restart atenolol at low-dose 25 daily, lisinopril 5 daily, outpatient medications  [facility] will sign off.   Medication Recommendations: Will restart atenolol, lisinopril Other recommendations (labs, testing, etc): No further cardiac testing at this time Follow up as an outpatient: In cardiology clinic   Long discussion with patient and husband at the bedside Total encounter time more than 50 minutes Greater than 50% was spent in counseling and coordination of care with the patient  For questions or updates, please contact Christine Please consult [email or web address] for contact info under        Signed, Ida Rogue, MD  12/23/2022, 2:29 PM

## 2022-12-23 NOTE — Progress Notes (Signed)
PROGRESS NOTE    [name]  [id number] DOB: 1941/08/18 DOA: 12/19/2022 PCP: Tracie Harrier, MD   Brief Narrative: [name] is a 82 y.o. female with a history of dCHF, HTN, HLD, PVD, DVT (s/p left IVC on 11/06/22), CKD-3a, kidney stone, diverticulitis, anemia, gout, hypothyroidism, chronic pressure ulcer in right hip, sacral ulcer, chronic ulcer in both heels of feet. Patient presented secondary to altered mental status and fever with concern for sepsis. Hypotension managed with IV fluids successfully with the help of midodrine. Workup revealed enterococcus bacteremia in addition to bacteruria and infected heel ulcer. Hospitalization complicated by development of an NSTEMI. Infectious disease and cardiology consulted for help in management.  2/11: Vitals stable.  Hemoglobin slowly drifting down again, at 8.6 without any obvious bleeding. Per cardiology note husband decided with conservative management for NSTEMI and treatment of sepsis.  ID is recommending continuation of vancomycin and Unasyn.  Enterococcus faecalis bacteremia and wound culture with multiple organisms including Enterococcus faecalis. Initially received heparin infusion which has been discontinued. PT is recommending home health and return back to ALF/IL with husband. Patient is wheelchair-bound for the past many month at baseline.   Assessment and Plan:  Severe sepsis Present on admission. Hypotension responsive to IV fluids. Patient started empirically on Vancomycin and Ceftriaxone for presumed wound infection and UTI source. Blood and urine cultures obtained. Midodrine also started for blood pressure management. Patient avoided ICU admission and midodrine discontinued. Blood cultures significant for enterococcus faecalis, likely from wound infection. Wound cultures significant for Proteus mirabilis, MRSA, Prevotella Bivia and enterococcus faecalis. Urine culture significant for Proteus mirabilis  (1,000 colonies/mL). -Continue vancomycin and Unasyn  UTI Only 1,000 colonies/mL noted on urine culture. Patient covered from management of wound infection.  Bilateral heel ulcers Wound culture significant for Proteus mirabilis, MRSA, Prevotella Bivia and enterococcus faecalis. Patient currently managed on Vancomycin and Unasyn  Bacteremia Secondary to enterococcus faecalis, likely from foot wound. ID already consulted. -Continue Ceftriaxone/Unasyn per ID recommendations -Repeat blood cultures  NSTEMI Patient with significant troponin elevation and peak of 2,592 with resultant downtrend but no associated chest pain. Cardiology consulted. Patient started on Heparin IV and continued on aspirin and Lipitor. Patient completed about 48 hours of heparin IV, which was also complicated by slightly worsened anemia. Cardiology with recommendations for conservative management. -Continue aspirin and Lipitor  Acute metabolic encephalopathy Likely secondary to acute illness. Resolved.  Chronic anemia Iron deficiency anemia Acute anemia Baseline hemoglobin of about 8. Hemoglobin of 8.9 on admission with downtrend to 7.3 and resultant 1 unit PRBC transfusion; heparin discontinued at that time. Post-transfusion hemoglobin of 10 with hemoglobin down to 8.6 today. No source of bleeding currently. -CBC in AM  Primary hypertension Antihypertensives held secondary to hypotension on admission.  Initially received midodrine. -Cardiology restarted atenolol at a lower dose and also added lisinopril 5 mg daily.  Gout No flare currently. Stable.  Chronic combined systolic and diastolic heart failure Stable. Transthoracic Echocardiogram this admission with new LVEF of 40-45%. Currently euvolemic.  Hyperlipidemia -Continue Lipitor  Acquired hypothyroidism -Continue Synthroid  Diabetes mellitus type 2 -Continue SSI  CKD stage IIIa Stable.  DVT Not acute. Patient is s/p IVC filter prior to  admission.  Pressure injury Mid sacrum, right/left heel. Present on admission.   DVT prophylaxis: SCDs Code Status:   Code Status: Full Code Family Communication: Discussed with patient Disposition Plan: Discharge pending continued specialist recommendations with need to transition to oral antibiotics   Consultants:  Cardiology Infectious  disease  Procedures:  Transthoracic Echocardiogram  Antimicrobials: Vancomycin Cefepime Ceftriaxone Unasyn Flagyl    Subjective: Patient was seen and examined today.  She is wheelchair-bound at baseline.  Complaining of knee and foot pain.  Denies any chest pain or shortness of breath  Objective: BP 134/84 (BP Location: Right Arm)   Pulse 89   Temp 99.5 F (37.5 C)   Resp 18   Ht 4' 11"$  (1.499 m)   Wt 54.5 kg   SpO2 98%   BMI 24.27 kg/m   Examination:  General.  Frail elderly lady, in no acute distress. Pulmonary.  Lungs clear bilaterally, normal respiratory effort. CV.  Regular rate and rhythm, no JVD, rub or murmur. Abdomen.  Soft, nontender, nondistended, BS positive. CNS.  Alert and oriented .  No focal neurologic deficit. Extremities.  No edema, no cyanosis, pulses intact and symmetrical.  Bilateral Provana boot Psychiatry.  Judgment and insight appears normal.   Data Reviewed: I have personally reviewed following labs and imaging studies  CBC Lab Results  Component Value Date   WBC 10.9 (H) 12/23/2022   RBC 3.46 (L) 12/23/2022   HGB 8.6 (L) 12/23/2022   HCT 27.6 (L) 12/23/2022   MCV 79.8 (L) 12/23/2022   MCH 24.9 (L) 12/23/2022   PLT 393 12/23/2022   MCHC 31.2 12/23/2022   RDW 17.8 (H) 12/23/2022   LYMPHSABS 2.2 12/19/2022   MONOABS 0.6 12/19/2022   EOSABS 0.0 12/19/2022   BASOSABS 0.1 [id number]     Last metabolic panel Lab Results  Component Value Date   NA 137 12/23/2022   K 4.1 12/23/2022   CL 114 (H) 12/23/2022   CO2 18 (L) 12/23/2022   BUN 13 12/23/2022   CREATININE 0.62 12/23/2022    GLUCOSE 153 (H) 12/23/2022   GFRNONAA >60 12/23/2022   GFRAA 54 (L) 12/22/2018   CALCIUM 7.6 (L) 12/23/2022   PHOS 1.9 (L) 12/22/2022   PROT 7.6 12/19/2022   ALBUMIN 2.8 (L) 12/19/2022   LABGLOB 3.2 06/02/2018   BILITOT 0.7 12/19/2022   ALKPHOS 64 12/19/2022   AST 27 12/19/2022   ALT 9 12/19/2022   ANIONGAP 5 12/23/2022    GFR: Estimated Creatinine Clearance: 42.9 mL/min (by C-G formula based on SCr of 0.62 mg/dL).  Recent Results (from the past 240 hour(s))  Culture, blood (routine x 2)     Status: Abnormal   Collection Time: 12/19/22  3:43 AM   Specimen: BLOOD  Result Value Ref Range Status   Specimen Description   Final    BLOOD RIGHT [facility] Performed at [facility], [address] [place], [place] [address]    Special Requests   Final    BOTTLES DRAWN AEROBIC AND ANAEROBIC Blood Culture results may not be optimal due to an excessive volume of blood received in culture bottles Performed at [facility], [address] [place], [place] [address]    Culture  Setup Time   Final    GRAM POSITIVE COCCI ANAEROBIC BOTTLE ONLY CRITICAL RESULT CALLED TO, READ BACK BY AND VERIFIED WITH: NATHAN BELUE AT [place] 12/21/22.PMF GRAM POSITIVE RODS AEROBIC BOTTLE ONLY CRITICAL RESULT CALLED TO, READ BACK BY AND VERIFIED WITH: Nilsa Nutting AT [id number] 12/21/22.PMF    Culture (A)  Final    ENTEROCOCCUS FAECALIS CORYNEBACTERIUM STRIATUM Standardized susceptibility testing for this organism is not available. Performed at [facility], [place] [address], [place], [place] [address]    Report Status 12/23/2022 FINAL  Final   Organism ID,  Bacteria ENTEROCOCCUS FAECALIS  Final      Susceptibility   Enterococcus faecalis - MIC*    AMPICILLIN <=2 SENSITIVE Sensitive     VANCOMYCIN 1 SENSITIVE Sensitive     GENTAMICIN SYNERGY SENSITIVE Sensitive     * ENTEROCOCCUS FAECALIS  Resp panel by RT-PCR (RSV, Flu A&B, Covid) Urine, Catheterized     Status: None   Collection Time:  12/19/22  3:43 AM   Specimen: Urine, Catheterized; Nasal Swab  Result Value Ref Range Status   SARS Coronavirus 2 by RT PCR NEGATIVE NEGATIVE Final    Comment: (NOTE) SARS-CoV-2 target nucleic acids are NOT DETECTED.  The SARS-CoV-2 RNA is generally detectable in upper respiratory specimens during the acute phase of infection. The lowest concentration of SARS-CoV-2 viral copies this assay can detect is 138 copies/mL. A negative result does not preclude SARS-Cov-2 infection and should not be used as the sole basis for treatment or other patient management decisions. A negative result may occur with  improper specimen collection/handling, submission of specimen other than nasopharyngeal swab, presence of viral mutation(s) within the areas targeted by this assay, and inadequate number of viral copies(<138 copies/mL). A negative result must be combined with clinical observations, patient history, and epidemiological information. The expected result is Negative.  Fact Sheet for Patients:  EntrepreneurPulse.com.au  Fact Sheet for Healthcare Providers:  IncredibleEmployment.be  This test is no t yet approved or cleared by the [place] FDA and  has been authorized for detection and/or diagnosis of SARS-CoV-2 by FDA under an Emergency Use Authorization (EUA). This EUA will remain  in effect (meaning this test can be used) for the duration of the COVID-19 declaration under Section 564(b)(1) of the Act, 21 U.S.C.section 360bbb-3(b)(1), unless the authorization is terminated  or revoked sooner.       Influenza A by PCR NEGATIVE NEGATIVE Final   Influenza B by PCR NEGATIVE NEGATIVE Final    Comment: (NOTE) The Xpert Xpress SARS-CoV-2/FLU/RSV plus assay is intended as an aid in the diagnosis of influenza from Nasopharyngeal swab specimens and should not be used as a sole basis for treatment. Nasal washings and aspirates are unacceptable for Xpert  Xpress SARS-CoV-2/FLU/RSV testing.  Fact Sheet for Patients: EntrepreneurPulse.com.au  Fact Sheet for Healthcare Providers: IncredibleEmployment.be  This test is not yet approved or cleared by the [place] FDA and has been authorized for detection and/or diagnosis of SARS-CoV-2 by FDA under an Emergency Use Authorization (EUA). This EUA will remain in effect (meaning this test can be used) for the duration of the COVID-19 declaration under Section 564(b)(1) of the Act, 21 U.S.C. section 360bbb-3(b)(1), unless the authorization is terminated or revoked.     Resp Syncytial Virus by PCR NEGATIVE NEGATIVE Final    Comment: (NOTE) Fact Sheet for Patients: EntrepreneurPulse.com.au  Fact Sheet for Healthcare Providers: IncredibleEmployment.be  This test is not yet approved or cleared by the [place] FDA and has been authorized for detection and/or diagnosis of SARS-CoV-2 by FDA under an Emergency Use Authorization (EUA). This EUA will remain in effect (meaning this test can be used) for the duration of the COVID-19 declaration under Section 564(b)(1) of the Act, 21 U.S.C. section 360bbb-3(b)(1), unless the authorization is terminated or revoked.  Performed at [facility], [address], [place], [place] [address]   Urine Culture     Status: Abnormal   Collection Time: 12/19/22  3:43 AM   Specimen: Urine, Catheterized  Result Value Ref Range Status   Specimen Description  Final    URINE, CATHETERIZED Performed at [facility], [place] [address], [place], [place] [address]    Special Requests   Final    NONE Reflexed from [phone] Performed at [facility], [place], [place] [address]    Culture 1,000 COLONIES/mL PROTEUS MIRABILIS (A)  Final   Report Status 12/23/2022 FINAL  Final   Organism ID, Bacteria PROTEUS MIRABILIS (A)  Final      Susceptibility    Proteus mirabilis - MIC*    AMPICILLIN <=2 SENSITIVE Sensitive     CEFEPIME <=0.12 SENSITIVE Sensitive     CEFTRIAXONE <=0.25 SENSITIVE Sensitive     CIPROFLOXACIN <=0.25 SENSITIVE Sensitive     GENTAMICIN <=1 SENSITIVE Sensitive     IMIPENEM 2 SENSITIVE Sensitive     NITROFURANTOIN RESISTANT Resistant     TRIMETH/SULFA <=20 SENSITIVE Sensitive     AMPICILLIN/SULBACTAM <=2 SENSITIVE Sensitive     PIP/TAZO <=4 SENSITIVE Sensitive     * 1,000 COLONIES/mL PROTEUS MIRABILIS  Blood Culture ID Panel (Reflexed)     Status: Abnormal   Collection Time: 12/19/22  3:43 AM  Result Value Ref Range Status   Enterococcus faecalis DETECTED (A) NOT DETECTED Final    Comment: CRITICAL RESULT CALLED TO, READ BACK BY AND VERIFIED WITH: NATHAN BELUE AT [id number] 12/21/22.PMF    Enterococcus Faecium NOT DETECTED NOT DETECTED Final   Listeria monocytogenes NOT DETECTED NOT DETECTED Final   Staphylococcus species NOT DETECTED NOT DETECTED Final   Staphylococcus aureus (BCID) NOT DETECTED NOT DETECTED Final   Staphylococcus epidermidis NOT DETECTED NOT DETECTED Final   Staphylococcus lugdunensis NOT DETECTED NOT DETECTED Final   Streptococcus species NOT DETECTED NOT DETECTED Final   Streptococcus agalactiae NOT DETECTED NOT DETECTED Final   Streptococcus pneumoniae NOT DETECTED NOT DETECTED Final   Streptococcus pyogenes NOT DETECTED NOT DETECTED Final   A.calcoaceticus-baumannii NOT DETECTED NOT DETECTED Final   Bacteroides fragilis NOT DETECTED NOT DETECTED Final   Enterobacterales NOT DETECTED NOT DETECTED Final   Enterobacter cloacae complex NOT DETECTED NOT DETECTED Final   Escherichia coli NOT DETECTED NOT DETECTED Final   Klebsiella aerogenes NOT DETECTED NOT DETECTED Final   Klebsiella oxytoca NOT DETECTED NOT DETECTED Final   Klebsiella pneumoniae NOT DETECTED NOT DETECTED Final   Proteus species NOT DETECTED NOT DETECTED Final   Salmonella species NOT DETECTED NOT DETECTED Final   Serratia  marcescens NOT DETECTED NOT DETECTED Final   Haemophilus influenzae NOT DETECTED NOT DETECTED Final   Neisseria meningitidis NOT DETECTED NOT DETECTED Final   Pseudomonas aeruginosa NOT DETECTED NOT DETECTED Final   Stenotrophomonas maltophilia NOT DETECTED NOT DETECTED Final   Candida albicans NOT DETECTED NOT DETECTED Final   Candida auris NOT DETECTED NOT DETECTED Final   Candida glabrata NOT DETECTED NOT DETECTED Final   Candida krusei NOT DETECTED NOT DETECTED Final   Candida parapsilosis NOT DETECTED NOT DETECTED Final   Candida tropicalis NOT DETECTED NOT DETECTED Final   Cryptococcus neoformans/gattii NOT DETECTED NOT DETECTED Final   Vancomycin resistance NOT DETECTED NOT DETECTED Final    Comment: Performed at [facility], [address] [place], [place] [address]  Culture, blood (routine x 2)     Status: None (Preliminary result)   Collection Time: 12/19/22  3:53 AM   Specimen: BLOOD  Result Value Ref Range Status   Specimen Description BLOOD LEFT FOREARM  Final   Special Requests   Final    BOTTLES DRAWN AEROBIC AND ANAEROBIC  Blood Culture results may not be optimal due to an inadequate volume of blood received in culture bottles   Culture   Final    NO GROWTH 4 DAYS Performed at [facility], [address] [place], [place] [address]    Report Status PENDING  Incomplete  Aerobic/Anaerobic Culture w Gram Stain (surgical/deep wound)     Status: None   Collection Time: 12/19/22  5:27 AM   Specimen: Foot  Result Value Ref Range Status   Specimen Description   Final    FOOT Performed at [facility], [address] [place], [place] [address]    Special Requests   Final    NONE Performed at [facility], [address] [place], [place] [address]    Gram Stain   Final    RARE WBC PRESENT, PREDOMINANTLY PMN FEW GRAM POSITIVE COCCI IN PAIRS IN CLUSTERS    Culture   Final    FEW PROTEUS MIRABILIS FEW METHICILLIN RESISTANT  STAPHYLOCOCCUS AUREUS FEW ENTEROCOCCUS FAECALIS RARE PREVOTELLA BIVIA BETA LACTAMASE POSITIVE Performed at [facility], [place] [address] [place], [place] [address]    Report Status 12/22/2022 FINAL  Final   Organism ID, Bacteria PROTEUS MIRABILIS  Final   Organism ID, Bacteria METHICILLIN RESISTANT STAPHYLOCOCCUS AUREUS  Final   Organism ID, Bacteria ENTEROCOCCUS FAECALIS  Final      Susceptibility   Enterococcus faecalis - MIC*    AMPICILLIN <=2 SENSITIVE Sensitive     VANCOMYCIN 1 SENSITIVE Sensitive     GENTAMICIN SYNERGY SENSITIVE Sensitive     * FEW ENTEROCOCCUS FAECALIS   Methicillin resistant staphylococcus aureus - MIC*    CIPROFLOXACIN <=0.5 SENSITIVE Sensitive     ERYTHROMYCIN >=8 RESISTANT Resistant     GENTAMICIN <=0.5 SENSITIVE Sensitive     OXACILLIN >=4 RESISTANT Resistant     TETRACYCLINE >=16 RESISTANT Resistant     VANCOMYCIN <=0.5 SENSITIVE Sensitive     TRIMETH/SULFA <=10 SENSITIVE Sensitive     CLINDAMYCIN <=0.25 SENSITIVE Sensitive     RIFAMPIN <=0.5 SENSITIVE Sensitive     Inducible Clindamycin NEGATIVE Sensitive     * FEW METHICILLIN RESISTANT STAPHYLOCOCCUS AUREUS   Proteus mirabilis - MIC*    AMPICILLIN <=2 SENSITIVE Sensitive     CEFAZOLIN <=4 SENSITIVE Sensitive     CEFEPIME <=0.12 SENSITIVE Sensitive     CEFTAZIDIME <=1 SENSITIVE Sensitive     CEFTRIAXONE <=0.25 SENSITIVE Sensitive     CIPROFLOXACIN <=0.25 SENSITIVE Sensitive     GENTAMICIN <=1 SENSITIVE Sensitive     IMIPENEM 1 SENSITIVE Sensitive     TRIMETH/SULFA <=20 SENSITIVE Sensitive     AMPICILLIN/SULBACTAM <=2 SENSITIVE Sensitive     PIP/TAZO <=4 SENSITIVE Sensitive     * FEW PROTEUS MIRABILIS  MRSA Next Gen by PCR, Nasal     Status: None   Collection Time: 12/20/22  5:38 PM   Specimen: Nasal Mucosa; Nasal Swab  Result Value Ref Range Status   MRSA by PCR Next Gen NOT DETECTED NOT DETECTED Final    Comment: (NOTE) The GeneXpert MRSA Assay (FDA approved for NASAL specimens  only), is one component of a comprehensive MRSA colonization surveillance program. It is not intended to diagnose MRSA infection nor to guide or monitor treatment for MRSA infections. Test performance is not FDA approved in patients less than 70 years old. Performed at [facility], [address] [place], [place] [address]       Radiology Studies: No results found.  LOS: 4 days   This record has been created using Systems analyst. Errors have been sought and corrected,but may not always be located. Such creation errors do not reflect on the standard of care.   Lorella Nimrod, MD [facility] 12/23/2022, 3:57 PM   If 7PM-7AM, please contact night-coverage [email or web address]

## 2022-12-23 NOTE — Evaluation (Signed)
Physical Therapy Evaluation Patient Details Name: [name] MRN: [id number] DOB: 1941-07-02 Today's Date: 12/23/2022  History of Present Illness  [name] is a 82 y.o. female with a history of dCHF, HTN, HLD, PVD, DVT (s/p left IVC on 11/06/22), CKD-3a, kidney stone, diverticulitis, anemia, gout, hypothyroidism, chronic pressure ulcer in right hip, sacral ulcer, chronic ulcer in both heels of feet. Patient presented secondary to altered mental status and fever with concern for sepsis. Hypotension managed with IV fluids successfully with the help of midodrine. Workup revealed enterococcus bacteremia in addition to bacteruria and infected heel ulcer. Hospitalization complicated by development of an NSTEMI. Infectious disease and cardiology consulted for help in management.  Clinical Impression  Pt received in recliner with spouse by her side. Pt got OOB to recliner earlier with two nursing staff with max of 2 as per Nurse. Pt agreeable to PT evaluation. PLOF: pt w/c bound at baseline due severe B knee L>R arthritis and pain. Spouse assists pt with transfers and w/c mobility. Pt received [place] aide for bathing and dressing everyday. Wound nurse care 2 x wk and was going to wound clinic every week for multiple wound care.  Today Pt A and O x 4 but demonstrated intermittent confusion during the session which is not her baseline as per spouse. pt needed total assist for STS. Pt wanted to sit it chair for her meals. PT provided exs in chair which and instruction on the board to perform them every 2 hours. Pt and spouse demonstrated good understanding. PT will continue 2 x week in acute and pt will benefit from returning ot ALF/IL with spouse with HHPT, [place] aide, wound care interventions, and W/C and cushion.      Recommendations for follow up therapy are one component of a multi-disciplinary discharge planning process, led by the attending physician.  Recommendations may be updated based on patient  status, additional functional criteria and insurance authorization.  Follow Up Recommendations Other (comment) (Return to ALF/IL with Spouse and community services: HHPT, HHaide and Wound care nurse.)      Assistance Recommended at Discharge Intermittent Supervision/Assistance  Patient can return home with the following  Two people to help with walking and/or transfers;A lot of help with bathing/dressing/bathroom;Assistance with cooking/housework;Direct supervision/assist for medications management;Assist for transportation    Equipment Recommendations Wheelchair (measurements PT);Wheelchair cushion (measurements PT)  Recommendations for Other Services       Functional Status Assessment Patient has had a recent decline in their functional status and demonstrates the ability to make significant improvements in function in a reasonable and predictable amount of time.     Precautions / Restrictions Precautions Precautions: Fall Restrictions Weight Bearing Restrictions: No      Mobility  Bed Mobility Overal bed mobility: Needs Assistance             General bed mobility comments: received in recliner with 2 nurses max assist.    Transfers Overall transfer level: Needs assistance Equipment used: None Transfers: Sit to/from Stand Sit to Stand: Max assist           General transfer comment: unable to achieve upright posture.    Ambulation/Gait: NON- AMBULATORY               General Gait Details: pt non-ambulatory at baseline  Stairs: N/A            Information systems manager mobility: Yes Wheelchair Assistance Details (indicate cue type and reason): Pt needs a w/c for assessment.  Modified Rankin (Stroke Patients Only)       Balance Overall balance assessment: Needs assistance Sitting-balance support: Bilateral upper extremity supported Sitting balance-Leahy Scale: Good     Standing balance support: Bilateral upper  extremity supported Standing balance-Leahy Scale: Poor Standing balance comment: max to remain standing in partial squat position.                             Pertinent Vitals/Pain      Home Living Family/patient expects to be discharged to:: Assisted living                 Home Equipment: BSC/3in1;Shower seat - built in;Toilet riser;Hospital bed;Rolling Walker (2 wheels) (w/c is broken) Additional Comments: reports living with spouse who can assist minimally    Prior Function Prior Level of Function : Needs assist  Cognitive Assist :  (Pt and O x 4 but could become confused intermittently.)           Mobility Comments: Huband present. He help pt mod to max with all transfers. pt propels W/C withmin assist. Non ambulatory at baseline 2/2 to sever Knee arthritis and pain. ADLs Comments: Gets help form Care giver and family gives meds. Pr was going to wound clinic every week and Home health nurse was 2 x wk for wounds dressing. Son and Husband very helpful and involved in pt care.     Hand Dominance   Dominant Hand: Right    Extremity/Trunk Assessment   Upper Extremity Assessment Upper Extremity Assessment: Generalized weakness    Lower Extremity Assessment Lower Extremity Assessment: Generalized weakness       Communication   Communication: No difficulties  Cognition Arousal/Alertness: Awake/alert Behavior During Therapy: WFL for tasks assessed/performed Overall Cognitive Status: Within Functional Limits for tasks assessed                                 General Comments: pleasant and motivated        General Comments General comments (skin integrity, edema, etc.): sores in Buttocks, Hips and heels    Exercises General Exercises - Lower Extremity Ankle Circles/Pumps: AROM, Both, 20 reps, Seated Gluteal Sets: Both, 10 reps, Seated Heel Slides: AROM, 10 reps, Seated Other Exercises Other Exercises: Pt advised ot perform the  same every 2 hrs. Provided with written instructions on [employer]. Spouse present.   Assessment/Plan    PT Assessment Patient needs continued PT services  PT Problem List Decreased strength;Decreased range of motion;Decreased activity tolerance;Decreased balance;Decreased mobility;Decreased cognition;Pain;Decreased skin integrity       PT Treatment Interventions Functional mobility training;Therapeutic activities;Therapeutic exercise;Balance training;Patient/family education;Wheelchair mobility training    PT Goals (Current goals can be found in the Care Plan section)  Acute Rehab PT Goals Patient Stated Goal: go home with help and family PT Goal Formulation: With patient/family Time For Goal Achievement: 01/06/23 Potential to Achieve Goals: Good Additional Goals Additional Goal #1: Pt will perform W/C mobility with Min assist ~ 75 ft safely using BUE and BLE.    Frequency Min 2X/week     Co-evaluation               AM-PAC PT "6 Clicks" Mobility  Outcome Measure Help needed turning from your back to your side while in a flat bed without using bedrails?: A Lot Help needed moving from lying on your back to sitting on the  side of a flat bed without using bedrails?: A Lot Help needed moving to and from a bed to a chair (including a wheelchair)?: Total Help needed standing up from a chair using your arms (e.g., wheelchair or bedside chair)?: Total Help needed to walk in hospital room?: Total Help needed climbing 3-5 steps with a railing? : Total 6 Click Score: 8    End of Session Equipment Utilized During Treatment: Gait belt       PT Visit Diagnosis: Muscle weakness (generalized) (M62.81);Difficulty in walking, not elsewhere classified (R26.2);Pain Pain - Right/Left: Left Pain - part of body: Knee    Time: [id number] PT Time Calculation (min) (ACUTE ONLY): 15 min   Charges:   PT Evaluation $PT Eval Low Complexity: 1 Low        Cherylin Waguespack PT DPT 2:25  PM,12/23/22

## 2022-12-23 NOTE — Plan of Care (Signed)
  Problem: Elimination: Goal: Will not experience complications related to bowel motility Outcome: Progressing   Problem: Activity: Goal: Risk for activity intolerance will decrease Outcome: Progressing   Problem: Nutrition: Goal: Adequate nutrition will be maintained Outcome: Progressing   Problem: Skin Integrity: Goal: Risk for impaired skin integrity will decrease Outcome: Progressing   Problem: Pain Managment: Goal: General experience of comfort will improve Outcome: Progressing

## 2022-12-24 ENCOUNTER — Other Ambulatory Visit (HOSPITAL_COMMUNITY): Payer: Self-pay

## 2022-12-24 DIAGNOSIS — I214 Non-ST elevation (NSTEMI) myocardial infarction: Secondary | ICD-10-CM | POA: Diagnosis not present

## 2022-12-24 DIAGNOSIS — D72829 Elevated white blood cell count, unspecified: Secondary | ICD-10-CM | POA: Diagnosis not present

## 2022-12-24 DIAGNOSIS — A419 Sepsis, unspecified organism: Secondary | ICD-10-CM | POA: Diagnosis not present

## 2022-12-24 DIAGNOSIS — N390 Urinary tract infection, site not specified: Secondary | ICD-10-CM | POA: Diagnosis not present

## 2022-12-24 LAB — GLUCOSE, CAPILLARY
Glucose-Capillary: 151 mg/dL — ABNORMAL HIGH (ref 70–99)
Glucose-Capillary: 89 mg/dL (ref 70–99)
Glucose-Capillary: 112 mg/dL — ABNORMAL HIGH (ref 70–99)

## 2022-12-24 LAB — CULTURE, BLOOD (ROUTINE X 2): Culture: NO GROWTH

## 2022-12-24 MED ORDER — POLYETHYLENE GLYCOL 3350 17 G PO PACK
17.0000 g | PACK | Freq: Every day | ORAL | Status: DC
  Administered 2022-12-24: 17 g via ORAL
  Filled 2022-12-24: qty 1

## 2022-12-24 MED ORDER — AMOXICILLIN-POT CLAVULANATE 875-125 MG PO TABS: 1.0000 | ORAL_TABLET | Freq: Two times a day (BID) | ORAL | 0 refills | Status: DC

## 2022-12-24 MED ORDER — POLYETHYLENE GLYCOL 3350 17 G PO PACK: 17.0000 g | PACK | Freq: Every day | ORAL | 0 refills | Status: AC

## 2022-12-24 MED ORDER — MEDIHONEY WOUND/BURN DRESSING EX PSTE: 1.0000 | PASTE | Freq: Every day | CUTANEOUS | 1 refills | Status: DC

## 2022-12-24 MED ORDER — LINEZOLID 600 MG PO TABS: 600.0000 mg | ORAL_TABLET | Freq: Two times a day (BID) | ORAL | 0 refills | Status: AC

## 2022-12-24 MED ORDER — ASPIRIN 81 MG PO TBEC: 81.0000 mg | DELAYED_RELEASE_TABLET | Freq: Every day | ORAL | 12 refills | Status: AC

## 2022-12-24 MED ORDER — FUROSEMIDE 40 MG PO TABS: 40.0000 mg | ORAL_TABLET | Freq: Every day | ORAL | Status: DC | PRN

## 2022-12-24 NOTE — Care Management Important Message (Signed)
Important Message  Patient Details  Name: [name] MRN: [id number] Date of Birth: [birth year]   Medicare Important Message Given:  Yes     Dannette Barbara 12/24/2022, 11:55 AM

## 2022-12-24 NOTE — TOC Transition Note (Signed)
Transition of Care [facility]) - CM/SW Discharge Note   Patient Details  Name: [name] MRN: [id number] Date of Birth: 1941-04-20  [facility] CM/SW Contact:  Beverly Sessions, RN Phone Number: 12/24/2022, 2:35 PM   Clinical Narrative:      Patient to discharge  [place] delivered to room [facility] with [facility] notified Family to transport       Patient Goals and CMS Choice      Discharge Placement                         Discharge Plan and Services Additional resources added to the After Visit Summary for                                       Social Determinants of Health (SDOH) Interventions SDOH Screenings   Food Insecurity: No Food Insecurity (12/20/2022)  Housing: Low Risk  (12/20/2022)  Transportation Needs: No Transportation Needs (12/20/2022)  Utilities: Not At Risk (12/20/2022)  Depression (PHQ2-9): Low Risk  (01/29/2019)  Tobacco Use: Low Risk  (12/20/2022)     Readmission Risk Interventions    12/24/2022   11:39 AM 11/07/2022    4:05 PM 08/24/2022   10:41 AM  Readmission Risk Prevention Plan  Transportation Screening Complete Complete Complete  PCP or Specialist Appt within 3-5 Days   Complete  HRI or [place]   Not Complete  HRI or Home Care Consult comments   SW will arrange this admission  Social Work Consult for [place] Planning/Counseling   Complete  Palliative Care Screening   Not Applicable  Medication Review Press photographer) Complete Complete Complete  HRI or [place] Not Applicable Complete

## 2022-12-24 NOTE — Discharge Summary (Signed)
Physician Discharge Summary   Patient: [name] MRN: [id number] DOB: 1941/02/10  Admit date:     12/19/2022  Discharge date: 12/24/22  Discharge Physician: Lorella Nimrod   PCP: Tracie Harrier, MD   Recommendations at discharge:  Please obtain CBC and CMP in 1 week Patient is being discharged on 1 week of Augmentin and Zyvox, please ensure the completion of antibiotic course. Follow-up with primary care doctor Follow-up with infectious disease Follow-up with urology as patient is being discharged with Foley catheter.  Discharge Diagnoses: Principal Problem:   Sepsis ([place]) Active Problems:   Lower urinary tract infectious disease   NSTEMI (non-ST elevated myocardial infarction) ([place])   Acute metabolic encephalopathy   Benign essential hypertension   Gout   Chronic diastolic CHF (congestive heart failure) (HCC)   HLD (hyperlipidemia)   Iron deficiency anemia   Acquired hypothyroidism   Type II diabetes mellitus with renal manifestations (HCC)   Chronic kidney disease, stage 3a (HCC)   DVT (deep venous thrombosis) (HCC)   Ulcer of both feet, limited to breakdown of skin ([place])   Pressure injury of skin_sacrum and right hip   Anemia due to stage 3a chronic kidney disease (HCC)   Leukocytosis   Fever   Hospital Course: MAILAN STENCIL is a 82 y.o. female with a history of dCHF, HTN, HLD, PVD, DVT (s/p left IVC on 11/06/22), CKD-3a, kidney stone, diverticulitis, anemia, gout, hypothyroidism, chronic pressure ulcer in right hip, sacral ulcer, chronic ulcer in both heels of feet. Patient presented secondary to altered mental status and fever with concern for sepsis. Hypotension managed with IV fluids successfully with the help of midodrine. Workup revealed enterococcus bacteremia in addition to bacteruria and infected heel ulcer. Hospitalization complicated by development of an NSTEMI. Infectious disease and cardiology consulted for help in management.  2/11: Vitals  stable.  Hemoglobin slowly drifting down again, at 8.6 without any obvious bleeding. Per cardiology note husband decided with conservative management for NSTEMI and treatment of sepsis.  ID is recommending continuation of vancomycin and Unasyn.  Enterococcus faecalis bacteremia and wound culture with multiple organisms including Enterococcus faecalis. Initially received heparin infusion which has been discontinued. PT is recommending home health and return back to ALF/IL with husband. Patient is wheelchair-bound for the past many month at baseline.  2/12: Remained hemodynamically stable.  No chest pain.  Home health services ordered. Discussed with infectious disease and patient can be discharged on Augmentin and linezolid for 7 more days.  No need for IV antibiotics as there was no deep seeded infection. A new wheelchair was also ordered.  Prior insurance authorization obtained for Zyvox.  Patient will continue with rest of her home medications and will continue taking antibiotics along with wound care as mentioned above.  Patient need to have a close follow-up with primary care provider and infectious disease for further recommendations.     Pain control - [employer] Controlled Substance Reporting System database was reviewed. and patient was instructed, not to drive, operate heavy machinery, perform activities at heights, swimming or participation in water activities or provide baby-sitting services while on Pain, Sleep and Anxiety Medications; until their outpatient Physician has advised to do so again. Also recommended to not to take more than prescribed Pain, Sleep and Anxiety Medications.  Consultants: Infectious disease Procedures performed: None Disposition: Home health Diet recommendation:  Discharge Diet Orders (From admission, onward)     Start     Ordered   12/24/22 0000  Diet - low  sodium heart healthy        12/24/22 1421           Cardiac and Carb modified  diet DISCHARGE MEDICATION: Allergies as of 12/24/2022       Reactions   Other Anaphylaxis   Anesthisia has been a health issue for her in the past.    Sulfa Antibiotics Rash        Medication List     TAKE these medications    acetaminophen 500 MG tablet Commonly known as: TYLENOL Take 500-1,000 mg by mouth 4 (four) times daily as needed for mild pain or moderate pain.   allopurinol 300 MG tablet Commonly known as: ZYLOPRIM Take 300 mg by mouth daily.   amoxicillin-clavulanate 875-125 MG tablet Commonly known as: AUGMENTIN Take 1 tablet by mouth 2 (two) times daily.   aspirin EC 81 MG tablet Take 1 tablet (81 mg total) by mouth daily. Swallow whole. Start taking on: December 25, 2022   atenolol 50 MG tablet Commonly known as: TENORMIN Take 50 mg by mouth daily.   atorvastatin 40 MG tablet Commonly known as: LIPITOR Take 40 mg by mouth daily.   cyanocobalamin 1000 MCG tablet Commonly known as: VITAMIN B12 Take 1,000 mcg by mouth daily.   diphenoxylate-atropine 2.5-0.025 MG tablet Commonly known as: LOMOTIL Take 1-2 tablets by mouth 3 (three) times daily as needed.   furosemide 40 MG tablet Commonly known as: LASIX Take 1 tablet (40 mg total) by mouth daily as needed for edema or fluid. What changed:  when to take this reasons to take this   gentamicin cream 0.1 % Commonly known as: GARAMYCIN Apply 1 Application topically in the morning and at bedtime.   HYDROcodone-acetaminophen 7.5-325 MG tablet Commonly known as: NORCO Take 1 tablet by mouth 2 (two) times daily as needed for moderate pain or severe pain.   leptospermum manuka honey Pste paste Apply 1 Application topically daily. Start taking on: December 25, 2022   levothyroxine 88 MCG tablet Commonly known as: SYNTHROID Take 88 mcg by mouth daily.   linezolid 600 MG tablet Commonly known as: ZYVOX Take 1 tablet (600 mg total) by mouth 2 (two) times daily for 7 days. Start taking on: December 25, 2022   lisinopril 5 MG tablet Commonly known as: ZESTRIL Take 5 mg by mouth daily.   metFORMIN 1000 MG tablet Commonly known as: GLUCOPHAGE Take 1,000 mg by mouth in the morning and at bedtime.   polyethylene glycol 17 g packet Commonly known as: MIRALAX / GLYCOLAX Take 17 g by mouth daily. Start taking on: December 25, 2022   sitaGLIPtin 50 MG tablet Commonly known as: JANUVIA Take 50 mg by mouth daily.               Durable Medical Equipment  (From admission, onward)           Start     Ordered   12/24/22 1148  For home use only DME lightweight manual wheelchair with seat cushion  Once       Comments: Patient suffers from bilateral heal wounds which impairs their ability to perform daily activities like dressing, grooming, and toileting in the home.  A walker will not resolve  issue with performing activities of daily living. A wheelchair will allow patient to safely perform daily activities. Patient is not able to propel themselves in the home using a standard weight wheelchair due to general weakness. Patient can self propel in the lightweight wheelchair. Length  of need Lifetime. Accessories: elevating leg rests (ELRs), wheel locks, extensions and anti-tippers.   12/24/22 1148              Discharge Care Instructions  (From admission, onward)           Start     Ordered   12/24/22 0000  Discharge wound care:       Comments: R medial ankle and R heel cleanse with soap and water, Cut piece of silver hydrofiber (Aquacel Leward Quan [phone]) to fit, apply to wound bed daily, cover with foam dressing.  2. Sacral wound cleanse with NS, cut piece of silver hydrofiber (Aquacel Leward Quan [phone]) to fit, use q tip applicator to make sure hydrofiber covers wound bed daily, cover with dry gauze and foam dressing.  All foam dressings to be changed q3 days and prn soiling.  12/19/22 1104     12/19/22 1105    Wound care  Daily      Comments: L heel cleanse with  soap and water, apply Medihoney to gauze and place in wound bed, cover with dry gauze and foam dressing.  Change foam dressing q3 days and prn soiling.   12/24/22 1421            Discharge Exam: Filed Weights   12/20/22 1751 12/21/22 0500 12/22/22 2330  Weight: 51.2 kg 52.6 kg 54.5 kg   General.  Frail elderly lady, in no acute distress. Pulmonary.  Lungs clear bilaterally, normal respiratory effort. CV.  Regular rate and rhythm, no JVD, rub or murmur. Abdomen.  Soft, nontender, nondistended, BS positive. CNS.  Alert and oriented .  No focal neurologic deficit. Extremities.  No edema, no cyanosis, pulses intact and symmetrical. Psychiatry.  Judgment and insight appears normal.   Condition at discharge: stable  The results of significant diagnostics from this hospitalization (including imaging, microbiology, ancillary and laboratory) are listed below for reference.   Imaging Studies: ECHOCARDIOGRAM LIMITED  Result Date: 12/20/2022    ECHOCARDIOGRAM LIMITED REPORT   Patient Name:   TULLY BOAKYE [facility] Date of Exam: 12/20/2022 Medical Rec #:  [id number]          Height:       59.0 in Accession #:    [id number]         Weight:       109.6 lb Date of Birth:  [birth year]          BSA:          1.428 m Patient Age:    12 years           BP:           119/73 mmHg Patient Gender: F                  HR:           88 bpm. Exam Location:  [facility] Procedure: Limited Echo Indications:     NSTEMI  History:         Patient has prior history of Echocardiogram examinations, most                  recent 10/31/2022. CHF, Acute MI, PAD,                  Signs/Symptoms:Bacteremia; Risk Factors:Hypertension, Diabetes                  and Dyslipidemia. Thrombus, Breast CA.  Sonographer:     Wenda Low Referring Phys:  [id number] Hebert Soho Diagnosing Phys: Ida Rogue MD  Sonographer Comments: Image acquisition challenging due to respiratory motion. IMPRESSIONS  1. Left ventricular ejection fraction, by  estimation, is 40 to 45%. The left ventricle has mildly decreased function. The left ventricle demonstrates regional wall motion abnormalities (hypokinesis of the anterior and anteroseptal wall). Left ventricular diastolic parameters are indeterminate. Wall motion abnormality appears new compared to prior study dated 12/23.  2. Right ventricular systolic function is low normal. The right ventricular size is normal. Tricuspid regurgitation signal is inadequate for assessing PA pressure.  3. The mitral valve is normal in structure. No evidence of mitral valve regurgitation. No evidence of mitral stenosis. Moderate mitral annular calcification.  4. The aortic valve is normal in structure. Aortic valve regurgitation is not visualized. Aortic valve sclerosis is present, with no evidence of aortic valve stenosis.  5. The inferior vena cava is normal in size with greater than 50% respiratory variability, suggesting right atrial pressure of 3 mmHg. FINDINGS  Left Ventricle: Left ventricular ejection fraction, by estimation, is 40 to 45%. The left ventricle has mildly decreased function. The left ventricle demonstrates regional wall motion abnormalities. The left ventricular internal cavity size was normal in size. There is no left ventricular hypertrophy. Left ventricular diastolic parameters are indeterminate. Right Ventricle: The right ventricular size is normal. No increase in right ventricular wall thickness. Right ventricular systolic function is low normal. Tricuspid regurgitation signal is inadequate for assessing PA pressure. Left Atrium: Left atrial size was normal in size. Right Atrium: Right atrial size was normal in size. Pericardium: There is no evidence of pericardial effusion. Mitral Valve: The mitral valve is normal in structure. There is mild calcification of the mitral valve leaflet(s). Moderate mitral annular calcification. No evidence of mitral valve stenosis. Tricuspid Valve: The tricuspid valve is  normal in structure. Tricuspid valve regurgitation is not demonstrated. No evidence of tricuspid stenosis. Aortic Valve: The aortic valve is normal in structure. Aortic valve regurgitation is not visualized. Aortic valve sclerosis is present, with no evidence of aortic valve stenosis. Pulmonic Valve: The pulmonic valve was normal in structure. Pulmonic valve regurgitation is not visualized. No evidence of pulmonic stenosis. Aorta: The aortic root is normal in size and structure. Venous: The inferior vena cava is normal in size with greater than 50% respiratory variability, suggesting right atrial pressure of 3 mmHg. IAS/Shunts: No atrial level shunt detected by color flow Doppler. LEFT VENTRICLE PLAX 2D LVIDd:         4.10 cm LVIDs:         2.40 cm LV PW:         1.30 cm LV IVS:        1.00 cm LVOT diam:     1.90 cm LVOT Area:     2.84 cm  LEFT ATRIUM         Index LA diam:    3.80 cm 2.66 cm/m   AORTA Ao Root diam: 2.70 cm  SHUNTS Systemic Diam: 1.90 cm Ida Rogue MD Electronically signed by Ida Rogue MD Signature Date/Time: 12/20/2022/1:13:18 PM    Final    CT Head Wo Contrast  Result Date: 12/19/2022 CLINICAL DATA:  82 year old female with altered mental status. Fever. EXAM: CT HEAD WITHOUT CONTRAST TECHNIQUE: Contiguous axial images were obtained from the base of the skull through the vertex without intravenous contrast. RADIATION DOSE REDUCTION: This exam was performed according to the departmental dose-optimization program which includes automated exposure control, adjustment of the mA and/or kV  according to patient size and/or use of iterative reconstruction technique. COMPARISON:  None Available. FINDINGS: Brain: No midline shift, ventriculomegaly, mass effect, evidence of mass lesion, intracranial hemorrhage or evidence of cortically based acute infarction. No cortical encephalomalacia identified. Mild to moderate for age scattered bilateral cerebral white matter hypodensity with some deep white  matter capsule involvement. Mild heterogeneity in the left basal ganglia. Vascular: Calcified atherosclerosis at the skull base. No suspicious intracranial vascular hyperdensity. Skull: Intact, negative. Sinuses/Orbits: Visualized paranasal sinuses and mastoids are clear. Other: No acute orbit or scalp soft tissue finding. IMPRESSION: 1. No acute intracranial abnormality. 2. Mild to moderate heterogeneity in the cerebral white matter and left basal for age ganglia compatible with small vessel disease. Electronically Signed   By: Genevie Ann M.D.   On: 12/19/2022 06:32   CT Abdomen Pelvis W Contrast  Result Date: 12/19/2022 CLINICAL DATA:  82 year old female with history of acute onset of nonlocalized abdominal pain. EXAM: CT ABDOMEN AND PELVIS WITH CONTRAST TECHNIQUE: Multidetector CT imaging of the abdomen and pelvis was performed using the standard protocol following bolus administration of intravenous contrast. RADIATION DOSE REDUCTION: This exam was performed according to the departmental dose-optimization program which includes automated exposure control, adjustment of the mA and/or kV according to patient size and/or use of iterative reconstruction technique. CONTRAST:  87m OMNIPAQUE IOHEXOL 300 MG/ML  SOLN COMPARISON:  CT of the abdomen and pelvis 11/01/2022. FINDINGS: Lower chest: Trace bilateral pleural effusions lying dependently. Atherosclerotic calcifications are noted in the descending thoracic aorta. Calcifications of the mitral annulus. Hepatobiliary: No definite suspicious cystic or solid hepatic lesions are noted. No intra or extrahepatic biliary ductal dilatation. Status post cholecystectomy. Pancreas: No pancreatic mass. No pancreatic ductal dilatation. No pancreatic or peripancreatic fluid collections or inflammatory changes. Spleen: Unremarkable. Adrenals/Urinary Tract: Extensive cortical thinning noted in the anterior aspect of the left kidney, presumably scarring from prior infection or  infarction. Right kidney and bilateral adrenal glands are otherwise normal in appearance. No hydroureteronephrosis. Urinary bladder is moderately distended, and base of the urinary bladder extends well below the level of the pubococcygeal line at rest, indicative of a cystocele from pelvic floor laxity. Stomach/Bowel: The appearance of the stomach is normal. No pathologic dilatation of small bowel or colon. Numerous colonic diverticuli are noted, without definite focal surrounding inflammatory changes to indicate an acute diverticulitis at this time. Normal appendix. Vascular/Lymphatic: Aortic atherosclerosis, without evidence of aneurysm or dissection in the abdominal or pelvic vasculature. IVC filter noted, with tip terminating shortly below the level of the renal veins. No lymphadenopathy noted in the abdomen or pelvis. Reproductive: Status post hysterectomy. Ovaries are not confidently identified may be surgically absent or atrophic. Other: No significant volume of ascites.  No pneumoperitoneum. Musculoskeletal: There are no aggressive appearing lytic or blastic lesions noted in the visualized portions of the skeleton. IMPRESSION: 1. No definite acute findings noted in the abdomen or pelvis to account for the patient's symptoms. 2. Trace bilateral pleural effusions lying dependently. 3. Colonic diverticulosis without evidence of acute diverticulitis at this time. 4. Aortic atherosclerosis. 5. Pelvic floor laxity with cystocele. 6. Additional incidental findings, as above. Electronically Signed   By: DVinnie LangtonM.D.   On: 12/19/2022 06:26   DG Foot Complete Right  Result Date: 12/19/2022 CLINICAL DATA:  82year old female with fever, altered mental status. Foot wound. Query osteomyelitis. EXAM: RIGHT FOOT COMPLETE - 3+ VIEW COMPARISON:  Calcaneus series 11/02/2022. FINDINGS: Bulky chronic degenerative and/or post arthrodesis changes at the right ankle  joint. Chronic sclerosis and cannulated screw  fragment there appear grossly stable since December. Bulky chronic dorsal calcaneus degenerative spurring with overlying soft tissue deficiency. But no osteolysis to indicate osteomyelitis. No soft tissue gas. No superimposed acute fracture or dislocation identified in the right foot. Hallux valgus and metatarsus primus varus. IMPRESSION: 1. Soft tissue deficiency overlying the dorsal calcaneus but no radiographic evidence of osteomyelitis. 2. Bulky chronic degenerative and/or post arthrodesis changes at the right ankle, with retained screw fragment. Electronically Signed   By: Genevie Ann M.D.   On: 12/19/2022 04:21   DG Foot Complete Left  Result Date: 12/19/2022 CLINICAL DATA:  82 year old female with fever, altered mental status. Foot wound. Query osteomyelitis. EXAM: LEFT FOOT - COMPLETE 3+ VIEW COMPARISON:  None Available. FINDINGS: Dorsal soft tissue wound overlying the calcaneus. Achilles insertion degenerative spurring at the calcaneus there, mildly discontinuous but no cortical osteolysis to suggest osteomyelitis. No tracking soft tissue gas. Hallux valgus and metatarsus primus varus. No acute fracture or dislocation identified. Some calcified peripheral vascular disease. IMPRESSION: 1. Dorsal soft tissue wound overlying the calcaneus with no radiographic evidence of Osteomyelitis. 2.  No acute osseous abnormality identified. Electronically Signed   By: Genevie Ann M.D.   On: 12/19/2022 04:18   DG Chest Port 1 View  Result Date: 12/19/2022 CLINICAL DATA:  82 year old female with fever, altered mental status. Confusion. EXAM: PORTABLE CHEST 1 VIEW COMPARISON:  Portable chest 11/01/2022 and earlier. FINDINGS: Portable AP semi upright view at 0401 hours. Mildly rotated to the right. Improved lung volumes compared to prior portable exams. Mediastinal contours are within normal limits. Allowing for portable technique the lungs are clear. No pneumothorax or pleural effusion identified. Chronic degenerative and  postoperative changes at the shoulders. No acute osseous abnormality identified. Paucity of bowel gas in the visible abdomen. IMPRESSION: No acute cardiopulmonary abnormality. Electronically Signed   By: Genevie Ann M.D.   On: 12/19/2022 04:16    Microbiology: Results for orders placed or performed during the hospital encounter of 12/19/22  Culture, blood (routine x 2)     Status: Abnormal   Collection Time: 12/19/22  3:43 AM   Specimen: BLOOD  Result Value Ref Range Status   Specimen Description   Final    BLOOD RIGHT [facility] Performed at [facility], [address] [place], [place] [address]    Special Requests   Final    BOTTLES DRAWN AEROBIC AND ANAEROBIC Blood Culture results may not be optimal due to an excessive volume of blood received in culture bottles Performed at [facility], [address] [place], [place] [address]    Culture  Setup Time   Final    GRAM POSITIVE COCCI ANAEROBIC BOTTLE ONLY CRITICAL RESULT CALLED TO, READ BACK BY AND VERIFIED WITH: NATHAN BELUE AT [id number] 12/21/22.PMF GRAM POSITIVE RODS AEROBIC BOTTLE ONLY CRITICAL RESULT CALLED TO, READ BACK BY AND VERIFIED WITH: Nilsa Nutting AT [id number] 12/21/22.PMF    Culture (A)  Final    ENTEROCOCCUS FAECALIS CORYNEBACTERIUM STRIATUM Standardized susceptibility testing for this organism is not available. Performed at [facility], [place] [address] [place], [place] [address]    Report Status 12/23/2022 FINAL  Final   Organism ID, Bacteria ENTEROCOCCUS FAECALIS  Final      Susceptibility   Enterococcus faecalis - MIC*    AMPICILLIN <=2 SENSITIVE Sensitive     VANCOMYCIN 1 SENSITIVE Sensitive     GENTAMICIN SYNERGY SENSITIVE Sensitive     * ENTEROCOCCUS FAECALIS  Resp panel by RT-PCR (RSV, Flu A&B, Covid) Urine, Catheterized     Status: None   Collection Time: 12/19/22  3:43 AM   Specimen: Urine, Catheterized; Nasal Swab  Result Value Ref Range Status   SARS Coronavirus 2 by RT PCR NEGATIVE NEGATIVE  Final    Comment: (NOTE) SARS-CoV-2 target nucleic acids are NOT DETECTED.  The SARS-CoV-2 RNA is generally detectable in upper respiratory specimens during the acute phase of infection. The lowest concentration of SARS-CoV-2 viral copies this assay can detect is 138 copies/mL. A negative result does not preclude SARS-Cov-2 infection and should not be used as the sole basis for treatment or other patient management decisions. A negative result may occur with  improper specimen collection/handling, submission of specimen other than nasopharyngeal swab, presence of viral mutation(s) within the areas targeted by this assay, and inadequate number of viral copies(<138 copies/mL). A negative result must be combined with clinical observations, patient history, and epidemiological information. The expected result is Negative.  Fact Sheet for Patients:  EntrepreneurPulse.com.au  Fact Sheet for Healthcare Providers:  IncredibleEmployment.be  This test is no t yet approved or cleared by the [place] FDA and  has been authorized for detection and/or diagnosis of SARS-CoV-2 by FDA under an Emergency Use Authorization (EUA). This EUA will remain  in effect (meaning this test can be used) for the duration of the COVID-19 declaration under Section 564(b)(1) of the Act, 21 U.S.C.section 360bbb-3(b)(1), unless the authorization is terminated  or revoked sooner.       Influenza A by PCR NEGATIVE NEGATIVE Final   Influenza B by PCR NEGATIVE NEGATIVE Final    Comment: (NOTE) The Xpert Xpress SARS-CoV-2/FLU/RSV plus assay is intended as an aid in the diagnosis of influenza from Nasopharyngeal swab specimens and should not be used as a sole basis for treatment. Nasal washings and aspirates are unacceptable for Xpert Xpress SARS-CoV-2/FLU/RSV testing.  Fact Sheet for Patients: EntrepreneurPulse.com.au  Fact Sheet for Healthcare  Providers: IncredibleEmployment.be  This test is not yet approved or cleared by the [place] FDA and has been authorized for detection and/or diagnosis of SARS-CoV-2 by FDA under an Emergency Use Authorization (EUA). This EUA will remain in effect (meaning this test can be used) for the duration of the COVID-19 declaration under Section 564(b)(1) of the Act, 21 U.S.C. section 360bbb-3(b)(1), unless the authorization is terminated or revoked.     Resp Syncytial Virus by PCR NEGATIVE NEGATIVE Final    Comment: (NOTE) Fact Sheet for Patients: EntrepreneurPulse.com.au  Fact Sheet for Healthcare Providers: IncredibleEmployment.be  This test is not yet approved or cleared by the [place] FDA and has been authorized for detection and/or diagnosis of SARS-CoV-2 by FDA under an Emergency Use Authorization (EUA). This EUA will remain in effect (meaning this test can be used) for the duration of the COVID-19 declaration under Section 564(b)(1) of the Act, 21 U.S.C. section 360bbb-3(b)(1), unless the authorization is terminated or revoked.  Performed at [facility], [address] [place], [place] [address]   Urine Culture     Status: Abnormal   Collection Time: 12/19/22  3:43 AM   Specimen: Urine, Catheterized  Result Value Ref Range Status   Specimen Description   Final    URINE, CATHETERIZED Performed at [facility], [place] [address], [place], [place] [address]    Special Requests   Final    NONE Reflexed from [id number] Performed at [facility], [address] [place],  [address]  Culture 1,000 COLONIES/mL PROTEUS MIRABILIS (A)  Final   Report Status 12/23/2022 FINAL  Final   Organism ID, Bacteria PROTEUS MIRABILIS (A)  Final      Susceptibility   Proteus mirabilis - MIC*    AMPICILLIN <=2 SENSITIVE Sensitive     CEFEPIME <=0.12 SENSITIVE Sensitive     CEFTRIAXONE <=0.25  SENSITIVE Sensitive     CIPROFLOXACIN <=0.25 SENSITIVE Sensitive     GENTAMICIN <=1 SENSITIVE Sensitive     IMIPENEM 2 SENSITIVE Sensitive     NITROFURANTOIN RESISTANT Resistant     TRIMETH/SULFA <=20 SENSITIVE Sensitive     AMPICILLIN/SULBACTAM <=2 SENSITIVE Sensitive     PIP/TAZO <=4 SENSITIVE Sensitive     * 1,000 COLONIES/mL PROTEUS MIRABILIS  Blood Culture ID Panel (Reflexed)     Status: Abnormal   Collection Time: 12/19/22  3:43 AM  Result Value Ref Range Status   Enterococcus faecalis DETECTED (A) NOT DETECTED Final    Comment: CRITICAL RESULT CALLED TO, READ BACK BY AND VERIFIED WITH: NATHAN BELUE AT [id number] 12/21/22.PMF    Enterococcus Faecium NOT DETECTED NOT DETECTED Final   Listeria monocytogenes NOT DETECTED NOT DETECTED Final   Staphylococcus species NOT DETECTED NOT DETECTED Final   Staphylococcus aureus (BCID) NOT DETECTED NOT DETECTED Final   Staphylococcus epidermidis NOT DETECTED NOT DETECTED Final   Staphylococcus lugdunensis NOT DETECTED NOT DETECTED Final   Streptococcus species NOT DETECTED NOT DETECTED Final   Streptococcus agalactiae NOT DETECTED NOT DETECTED Final   Streptococcus pneumoniae NOT DETECTED NOT DETECTED Final   Streptococcus pyogenes NOT DETECTED NOT DETECTED Final   A.calcoaceticus-baumannii NOT DETECTED NOT DETECTED Final   Bacteroides fragilis NOT DETECTED NOT DETECTED Final   Enterobacterales NOT DETECTED NOT DETECTED Final   Enterobacter cloacae complex NOT DETECTED NOT DETECTED Final   Escherichia coli NOT DETECTED NOT DETECTED Final   Klebsiella aerogenes NOT DETECTED NOT DETECTED Final   Klebsiella oxytoca NOT DETECTED NOT DETECTED Final   Klebsiella pneumoniae NOT DETECTED NOT DETECTED Final   Proteus species NOT DETECTED NOT DETECTED Final   Salmonella species NOT DETECTED NOT DETECTED Final   Serratia marcescens NOT DETECTED NOT DETECTED Final   Haemophilus influenzae NOT DETECTED NOT DETECTED Final   Neisseria meningitidis NOT  DETECTED NOT DETECTED Final   Pseudomonas aeruginosa NOT DETECTED NOT DETECTED Final   Stenotrophomonas maltophilia NOT DETECTED NOT DETECTED Final   Candida albicans NOT DETECTED NOT DETECTED Final   Candida auris NOT DETECTED NOT DETECTED Final   Candida glabrata NOT DETECTED NOT DETECTED Final   Candida krusei NOT DETECTED NOT DETECTED Final   Candida parapsilosis NOT DETECTED NOT DETECTED Final   Candida tropicalis NOT DETECTED NOT DETECTED Final   Cryptococcus neoformans/gattii NOT DETECTED NOT DETECTED Final   Vancomycin resistance NOT DETECTED NOT DETECTED Final    Comment: Performed at [facility], [address] [place], [place] [address]  Culture, blood (routine x 2)     Status: None   Collection Time: 12/19/22  3:53 AM   Specimen: BLOOD  Result Value Ref Range Status   Specimen Description BLOOD LEFT FOREARM  Final   Special Requests   Final    BOTTLES DRAWN AEROBIC AND ANAEROBIC Blood Culture results may not be optimal due to an inadequate volume of blood received in culture bottles   Culture   Final    NO GROWTH 5 DAYS Performed at [facility], [address], [place], [place] [address]    Report Status 12/24/2022 FINAL  Final  Aerobic/Anaerobic Culture w Gram Stain (surgical/deep wound)     Status: None   Collection Time: 12/19/22  5:27 AM   Specimen: Foot  Result Value Ref Range Status   Specimen Description   Final    FOOT Performed at [facility], [address], [place], [place] [address]    Special Requests   Final    NONE Performed at [facility], [address] [place], [place] [address]    Gram Stain   Final    RARE WBC PRESENT, PREDOMINANTLY PMN FEW GRAM POSITIVE COCCI IN PAIRS IN CLUSTERS    Culture   Final    FEW PROTEUS MIRABILIS FEW METHICILLIN RESISTANT STAPHYLOCOCCUS AUREUS FEW ENTEROCOCCUS FAECALIS RARE PREVOTELLA BIVIA BETA LACTAMASE POSITIVE Performed at [facility], [place] [address], [place], [place] [address]    Report Status 12/22/2022 FINAL  Final   Organism ID, Bacteria PROTEUS MIRABILIS  Final   Organism ID, Bacteria METHICILLIN RESISTANT STAPHYLOCOCCUS AUREUS  Final   Organism ID, Bacteria ENTEROCOCCUS FAECALIS  Final      Susceptibility   Enterococcus faecalis - MIC*    AMPICILLIN <=2 SENSITIVE Sensitive     VANCOMYCIN 1 SENSITIVE Sensitive     GENTAMICIN SYNERGY SENSITIVE Sensitive     * FEW ENTEROCOCCUS FAECALIS   Methicillin resistant staphylococcus aureus - MIC*    CIPROFLOXACIN <=0.5 SENSITIVE Sensitive     ERYTHROMYCIN >=8 RESISTANT Resistant     GENTAMICIN <=0.5 SENSITIVE Sensitive     OXACILLIN >=4 RESISTANT Resistant     TETRACYCLINE >=16 RESISTANT Resistant     VANCOMYCIN <=0.5 SENSITIVE Sensitive     TRIMETH/SULFA <=10 SENSITIVE Sensitive     CLINDAMYCIN <=0.25 SENSITIVE Sensitive     RIFAMPIN <=0.5 SENSITIVE Sensitive     Inducible Clindamycin NEGATIVE Sensitive     * FEW METHICILLIN RESISTANT STAPHYLOCOCCUS AUREUS   Proteus mirabilis - MIC*    AMPICILLIN <=2 SENSITIVE Sensitive     CEFAZOLIN <=4 SENSITIVE Sensitive     CEFEPIME <=0.12 SENSITIVE Sensitive     CEFTAZIDIME <=1 SENSITIVE Sensitive     CEFTRIAXONE <=0.25 SENSITIVE Sensitive     CIPROFLOXACIN <=0.25 SENSITIVE Sensitive     GENTAMICIN <=1 SENSITIVE Sensitive     IMIPENEM 1 SENSITIVE Sensitive     TRIMETH/SULFA <=20 SENSITIVE Sensitive     AMPICILLIN/SULBACTAM <=2 SENSITIVE Sensitive     PIP/TAZO <=4 SENSITIVE Sensitive     * FEW PROTEUS MIRABILIS  MRSA Next Gen by PCR, Nasal     Status: None   Collection Time: 12/20/22  5:38 PM   Specimen: Nasal Mucosa; Nasal Swab  Result Value Ref Range Status   MRSA by PCR Next Gen NOT DETECTED NOT DETECTED Final    Comment: (NOTE) The GeneXpert MRSA Assay (FDA approved for NASAL specimens only), is one component of a comprehensive MRSA colonization surveillance program. It is not intended to diagnose MRSA infection nor to guide or  monitor treatment for MRSA infections. Test performance is not FDA approved in patients less than 13 years old. Performed at [facility], [address] [place], [place] [address]   Culture, blood (Routine X 2) w Reflex to ID Panel     Status: None (Preliminary result)   Collection Time: 12/23/22  1:57 PM   Specimen: BLOOD  Result Value Ref Range Status   Specimen Description BLOOD BLOOD RIGHT HAND  Final   Special Requests   Final    BOTTLES DRAWN AEROBIC AND  ANAEROBIC Blood Culture results may not be optimal due to an excessive volume of blood received in culture bottles   Culture   Final    NO GROWTH < 24 HOURS Performed at [facility], [address] [place], [place] [address]    Report Status PENDING  Incomplete  Culture, blood (Routine X 2) w Reflex to ID Panel     Status: None (Preliminary result)   Collection Time: 12/23/22  1:57 PM   Specimen: BLOOD  Result Value Ref Range Status   Specimen Description BLOOD BLOOD LEFT HAND  Final   Special Requests   Final    BOTTLES DRAWN AEROBIC AND ANAEROBIC Blood Culture results may not be optimal due to an excessive volume of blood received in culture bottles   Culture   Final    NO GROWTH < 24 HOURS Performed at [facility], [address] [place], [place] [address]    Report Status PENDING  Incomplete    Labs: CBC: Recent Labs  Lab 12/19/22 0353 12/20/22 0516 12/21/22 0657 12/22/22 0639 12/23/22 0356  WBC 21.7* 15.4* 12.6* 9.6 10.9*  NEUTROABS 18.6*  --   --   --   --   HGB 8.9* 7.3* 10.0* 9.1* 8.6*  HCT 29.8* 24.0* 32.1* 28.6* 27.6*  MCV 79.3* 80.0 81.5 79.9* 79.8*  PLT 517* 410* 438* 446* [id number]   Basic Metabolic Panel: Recent Labs  Lab 12/19/22 0353 12/20/22 0516 12/22/22 0639 12/23/22 0356  NA 136 138 140 137  K 3.8 3.6 3.1* 4.1  CL 108 113* 115* 114*  CO2 18* 18* 18* 18*  GLUCOSE 192* 112* 113* 153*  BUN 20 28* 16 13  CREATININE 0.98 0.83 0.65 0.62  CALCIUM 8.1* 7.5*  7.8* 7.6*  MG  --   --  2.0  --   PHOS  --   --  1.9*  --    Liver Function Tests: Recent Labs  Lab 12/19/22 0353  AST 27  ALT 9  ALKPHOS 64  BILITOT 0.7  PROT 7.6  ALBUMIN 2.8*   CBG: Recent Labs  Lab 12/23/22 1216 12/23/22 1654 12/23/22 2041 12/24/22 0845 12/24/22 1235  GLUCAP 170* 128* 147* 151* 89    Discharge time spent: greater than 30 minutes.  This record has been created using Systems analyst. Errors have been sought and corrected,but may not always be located. Such creation errors do not reflect on the standard of care.   Signed: Lorella Nimrod, MD [facility] 12/24/2022

## 2022-12-24 NOTE — Progress Notes (Signed)
ID DELAYAH [name] is a 82 y.o. 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 and IVC filter placement 11/06/22, HTN, DM, D CHF, ca breast s/p rt mastectomy, hypothyroidism , presented to the ED from home on 12/19/22 with fever and altered mental status and b/l heel ulcers Hospitalized in Dec 2023 for similar problem and underwent debridement of the rt heel ulcer , no osteo on bone biopsy, also had mechanical thrombectomy for left leg DVT, IVC filter placement Votals in the ED on presentation 12/19/22 155/74, temp 102.3, HR 117, RR 25 and sats 95%   Spoke to the patient on the phone and chart reviewed  [name] says she is feeling better She has constipation and has been given laxatives    Patient Vitals for the past 24 hrs:  BP Temp Temp src Pulse Resp SpO2  12/24/22 0930 -- -- -- -- 18 --  12/24/22 0902 121/84 99.8 F (37.7 C) Oral 89 19 100 %  12/24/22 0009 138/85 -- -- 88 -- 97 %  12/23/22 1646 137/85 99.1 F (37.3 C) Oral 91 18 97 %    Labs    Latest Ref Rng & Units 12/23/2022    3:56 AM 12/22/2022    6:39 AM 12/21/2022    6:57 AM  CBC  WBC 4.0 - 10.5 K/uL 10.9  9.6  12.6   Hemoglobin 12.0 - 15.0 g/dL 8.6  9.1  10.0   Hematocrit 36.0 - 46.0 % 27.6  28.6  32.1   Platelets 150 - 400 K/uL 393  446  438        Latest Ref Rng & Units 12/23/2022    3:56 AM 12/22/2022    6:39 AM 12/20/2022    5:16 AM  CMP  Glucose 70 - 99 mg/dL 153  113  112   BUN 8 - 23 mg/dL 13  16  28   $ Creatinine 0.44 - 1.00 mg/dL 0.62  0.65  0.83   Sodium 135 - 145 mmol/L 137  140  138   Potassium 3.5 - 5.1 mmol/L 4.1  3.1  3.6   Chloride 98 - 111 mmol/L 114  115  113   CO2 22 - 32 mmol/L 18  18  18   $ Calcium 8.9 - 10.3 mg/dL 7.6  7.8  7.5      Micro BC- 12/19/22 1 of 4 enterococcus and corynebactereium Left foot wound culture- polymicrobial Enterococcus, MRSA, proteus, and prevotella BC 12/23/22  -NG   Impression/recommendation 82 yr female presenting with fever and altered mental status   Acute metabolic encephalopathy- resolved   Enterococcus bacteremia 1 of 4. UTI questioned but the culture was only 1000 colonies - proteus But  the source of enterococcus could be from urine or wound or could be  a contaminant as 1/4 and also had corynebacterium a skin bacteria in the same culture bottle   She has cystocele and h/o urinary retention Bladder scan done now showed 700cc of urine and she has been catheterized Will repeat blood cultures ? B/l heel ulcers and sacral decub- polymicrobial wound culture MRSA/proteus, prevotella   [name] currently on unasyn and vanco [name] wants to do only oral antibiotic and also IV not necessarily required as low bioburden , no concern for endovascular or deep seated infection and also day 7 of appropriate Iv today So can do PO augmentin/linezolid for 1 week ( Need to check  insurance for linezolid coverage) if not a quinolone may be an option)   Anemia?  Discussed the management with the patient and care team. Will follow her as OP

## 2022-12-24 NOTE — TOC Benefit Eligibility Note (Signed)
Patient Teacher, English as a foreign language completed.    The patient is currently admitted and upon discharge could be taking linezolid (Zyvox) 600 mg tablets.  The current 10 day co-pay is $73.61.   The patient is insured through [place], [place] Patient Advocate Specialist [place] Patient Advocate Team Direct Number: [phone]  Fax: [phone]

## 2022-12-24 NOTE — Progress Notes (Signed)
    Durable Medical Equipment  (From admission, onward)           Start     Ordered   12/24/22 1148  For home use only DME lightweight manual wheelchair with seat cushion  Once       Comments: Patient suffers from bilateral heal wounds which impairs their ability to perform daily activities like dressing, grooming, and toileting in the home.  A walker will not resolve  issue with performing activities of daily living. A wheelchair will allow patient to safely perform daily activities. Patient is not able to propel themselves in the home using a standard weight wheelchair due to general weakness. Patient can self propel in the lightweight wheelchair. Length of need Lifetime. Accessories: elevating leg rests (ELRs), wheel locks, extensions and anti-tippers.   12/24/22 1148

## 2022-12-24 NOTE — Discharge Instructions (Signed)
Patient also need to follow-up with urology as she is being discharged with Foley catheter

## 2022-12-24 NOTE — Plan of Care (Signed)
  Problem: Education: Goal: Ability to describe self-care measures that may prevent or decrease complications (Diabetes Survival Skills Education) will improve Outcome: Progressing   Problem: Coping: Goal: Ability to adjust to condition or change in health will improve Outcome: Progressing   Problem: Fluid Volume: Goal: Ability to maintain a balanced intake and output will improve Outcome: Progressing   

## 2022-12-24 NOTE — Evaluation (Signed)
Occupational Therapy Evaluation Patient Details Name: [name] MRN: [id number] DOB: [birth year] Today's Date: 12/24/2022   History of Present Illness [name] is a 82 y.o. female with a history of dCHF, HTN, HLD, PVD, DVT (s/p left IVC on 11/06/22), CKD-3a, kidney stone, diverticulitis, anemia, gout, hypothyroidism, chronic pressure ulcer in right hip, sacral ulcer, chronic ulcer in both heels of feet. Patient presented secondary to altered mental status and fever with concern for sepsis. Hypotension managed with IV fluids successfully with the help of midodrine. Workup revealed enterococcus bacteremia in addition to bacteruria and infected heel ulcer. Hospitalization complicated by development of an NSTEMI. Infectious disease and cardiology consulted for help in management.   Clinical Impression   Patient presenting with decreased independence in self care, balance, functional mobility/transfers, endurance, and safety awareness. PTA pt received assistance for all ADLs, IADLs, functional transfers, and w/c mobility. Pt has [place] aide to assist with bathing/dressing and [facility] nurse for wound dressing changes. Pt currently functioning at Max A for supine to sit, Max A for squat pivot transfer from EOB>recliner, Max A for LB dressing, and set up A for self-feeding. Pt's spouse/son will be available to provide physical assistance as needed for ADLs/IADLs upon discharge. Pt will benefit from acute OT to increase overall independence in the areas of ADLs and functional mobility in order to safely discharge home. Pt could benefit from [facility] following D/C to decrease falls risk, improve balance, and maximize independence in self-care within own home environment.    Recommendations for follow up therapy are one component of a multi-disciplinary discharge planning process, led by the attending physician.  Recommendations may be updated based on patient status, additional functional criteria and  insurance authorization.   Follow Up Recommendations  Other (comment) (Return to ALF with assist from Spouse, [place], [place] aide, and Wound care nurse.)     Assistance Recommended at Discharge Frequent or constant Supervision/Assistance  Patient can return home with the following Two people to help with walking and/or transfers;A lot of help with bathing/dressing/bathroom;Assistance with cooking/housework;Assist for transportation;Help with stairs or ramp for entrance;Direct supervision/assist for financial management;Direct supervision/assist for medications management    Functional Status Assessment  Patient has had a recent decline in their functional status and demonstrates the ability to make significant improvements in function in a reasonable and predictable amount of time.  Equipment Recommendations  None recommended by OT    Recommendations for Other Services       Precautions / Restrictions Precautions Precautions: Fall Restrictions Weight Bearing Restrictions: No      Mobility Bed Mobility Overal bed mobility: Needs Assistance Bed Mobility: Supine to Sit     Supine to sit: HOB elevated, Max assist (assist for trunk elevation and BLEs)     General bed mobility comments: Max A to scoot hips forward at EOB    Transfers Overall transfer level: Needs assistance Equipment used: None Transfers: Bed to chair/wheelchair/BSC     Squat pivot transfers: Max assist       General transfer comment: Pt unable to achieve upright posture. +2 to scoot hips back in recliner.      Balance Overall balance assessment: Needs assistance Sitting-balance support: Bilateral upper extremity supported, Feet supported Sitting balance-Leahy Scale: Good     Standing balance support: Bilateral upper extremity supported Standing balance-Leahy Scale: Zero Standing balance comment: difficulty WBing and using BLEs to push self up into standing position despite Max A  ADL either performed or assessed with clinical judgement   ADL Overall ADL's : Needs assistance/impaired Eating/Feeding: Set up;Sitting Eating/Feeding Details (indicate cue type and reason): assistance provided for opening small containers Grooming: Set up;Sitting;Supervision/safety           Upper Body Dressing : Minimal assistance;Sitting   Lower Body Dressing: Maximal assistance;Sitting/lateral leans   Toilet Transfer: Maximal assistance;Squat-pivot Toilet Transfer Details (indicate cue type and reason): simulated Toileting- Clothing Manipulation and Hygiene: Maximal assistance;Sit to/from stand               Vision Patient Visual Report: No change from baseline       Perception     Praxis      Pertinent Vitals/Pain Pain Assessment Pain Assessment: Faces Faces Pain Scale: Hurts little more Pain Location: B ankles and sacral area Pain Descriptors / Indicators: Sore, Aching Pain Intervention(s): Limited activity within patient's tolerance, Monitored during session, Repositioned     Hand Dominance Right   Extremity/Trunk Assessment Upper Extremity Assessment Upper Extremity Assessment: Generalized weakness   Lower Extremity Assessment Lower Extremity Assessment: Generalized weakness       Communication Communication Communication: HOH   Cognition Arousal/Alertness: Awake/alert Behavior During Therapy: WFL for tasks assessed/performed, Anxious Overall Cognitive Status: No family/caregiver present to determine baseline cognitive functioning                                 General Comments: Pt anxious during mobility endorsing fear of falling. Oriented to person/place/time and grossly to situation.     General Comments  pressure sores on B heels and sacral area    Exercises Other Exercises Other Exercises: OT provided education re: role of OT, OT POC, post acute recs, sitting up for all meals, EOB/OOB mobility with  assistance, home/fall safety.     Shoulder Instructions      Home Living Family/patient expects to be discharged to:: Assisted living                             Home Equipment: BSC/3in1;Shower seat - built in;Toilet riser;Hospital bed;Rolling Walker (2 wheels) (w/c is broken)   Additional Comments: reports living with spouse who can assist minimally      Prior Functioning/Environment Prior Level of Function : Needs assist             Mobility Comments: [place] assists with all transfers and w/c mobility. Non-ambulatory at baseline 2/2 to severe Knee arthritis and pain. ADLs Comments: Pt was going to wound clinic every week and [place] was coming 2x/wk for wound dressings. Son and Husband very involved in pt care, assist with ADLs/IADLs as needed (including transportation, med mgmt). Pt reports [place] aide comes every other day for a couple hours to assist with bathing/dressing.        OT Problem List: Decreased strength;Decreased activity tolerance;Impaired balance (sitting and/or standing);Pain;Decreased range of motion;Decreased safety awareness      OT Treatment/Interventions: Self-care/ADL training;Therapeutic exercise;Therapeutic activities;Cognitive remediation/compensation;Energy conservation;DME and/or AE instruction;Patient/family education;Balance training    OT Goals(Current goals can be found in the care plan section) Acute Rehab OT Goals Patient Stated Goal: return home OT Goal Formulation: With patient Time For Goal Achievement: 01/07/23 Potential to Achieve Goals: Fair   OT Frequency: Min 2X/week    Co-evaluation              AM-PAC OT "6 Clicks" Daily Activity  Outcome Measure Help from another person eating meals?: A Little Help from another person taking care of personal grooming?: A Little Help from another person toileting, which includes using toliet, bedpan, or urinal?: A Lot Help from another person bathing (including washing,  rinsing, drying)?: A Lot Help from another person to put on and taking off regular upper body clothing?: A Little Help from another person to put on and taking off regular lower body clothing?: A Lot 6 Click Score: 15   End of Session Equipment Utilized During Treatment: Gait belt Nurse Communication: Mobility status  Activity Tolerance: Patient limited by fatigue;Patient limited by pain Patient left: in chair;with call bell/phone within reach;with chair alarm set  OT Visit Diagnosis: Other abnormalities of gait and mobility (R26.89);Muscle weakness (generalized) (M62.81);Pain Pain - Right/Left:  (bilateral) Pain - part of body: Ankle and joints of foot (sacral area)                Time: [id number] OT Time Calculation (min): 35 min Charges:  OT General Charges $OT Visit: 1 Visit OT Evaluation $OT Eval Moderate Complexity: 1 Mod  [employer] [facility] [employer] MS, OTR/L ascom [phone]  12/24/22, 1:11 PM

## 2022-12-24 NOTE — Consult Note (Signed)
Pharmacy Antibiotic Note  [name] is a 82 y.o. female with PMH including CHF, HTN, HLD, PVD, DVT s/p IVC filter, CKD, kidney stones, diverticulitis, anemia, gout, hypothyroidism admitted on 12/19/2022 with sepsis / elevated troponin. Source of infection thought to be urine versus bilateral heel wounds. Pharmacy has been consulted for vancomycin dosing.Patient is also ordered ceftriaxone. Renal function stable and at apparent baseline  Today, 12/24/2022 Day # 6 vancomycin, ceftriaxone to ampicillin/sulbactam Renal: SCr Stable WBC has improved since admission Afebrile Bloods cx from 2/7 with E faecalis and Corynebacterium in 1 of 4 bottes Wound cx from heel with multiple organisms_ MRSA, P mirabilis, E feacalis   Plan: Continue vancomycin 1000 mg IV q36H  ---AUC of 524  ---Goal AUC of 400-600 ---Plan for vancomycin levels with next dose 2/13 at 6am if plan is to continue vancomycin Ampicillin/Sulbactam 3gm IV q6h remains appropriately dosed Follow renal function and ID plan for antibiotics  Height: 4' 11"$  (149.9 cm) Weight: 54.5 kg (120 lb 2.4 oz) IBW/kg (Calculated) : 43.2  Temp (24hrs), Avg:99.5 F (37.5 C), Min:99.1 F (37.3 C), Max:99.8 F (37.7 C)  Recent Labs  Lab 12/19/22 0353 12/19/22 0534 12/20/22 0516 12/21/22 0657 12/22/22 0639 12/23/22 0356  WBC 21.7*  --  15.4* 12.6* 9.6 10.9*  CREATININE 0.98  --  0.83  --  0.65 0.62  LATICACIDVEN 2.8* 2.1*  --   --   --   --      Estimated Creatinine Clearance: 42.9 mL/min (by C-G formula based on SCr of 0.62 mg/dL).    Allergies  Allergen Reactions   Other Anaphylaxis    Anesthisia has been a health issue for her in the past.    Sulfa Antibiotics Rash    Antimicrobials this admission: cefepime 2/7 x 1 metronidazole 2/7 x 1 vancomycin 2/7 >>  ceftriaxone 2/7 >>   Microbiology results: 2/7 BCx: E faecalis/Corynebacterium 2/7 UCx: <10,000 COLONIES/mL INSIGNIFICANT GROWTH  2/7 WoundCx: P mirabilis, E  faecalis, S aureus  Thank you for allowing pharmacy to be a part of this patient's care.  Doreene Eland, PharmD, BCPS, BCIDP Work Cell: [phone] 12/24/2022 12:00 PM

## 2022-12-24 NOTE — TOC Initial Note (Addendum)
Transition of Care [facility]) - Initial/Assessment Note    Patient Details  Name: [name] MRN: [id number] Date of Birth: 11-Oct-1941  Transition of Care [facility]) CM/SW Contact:    Beverly Sessions, RN Phone Number: 12/24/2022, 11:43 AM  Clinical Narrative:                    Admitted for: sepsis Admitted from: home with spouse.  Son jon at bedside. Patient and spouse own [facility], she is not a resident there, but she lives on sight  PCP Hande Current home health/prior home health/DME: BSC,rw, hospital bed, toilet riser.  Has a WC but it is old and doesn't work properly and would like to see if she qualifies for a new one.  Referral made to [facility] with adapt   Open with [facility] home health for RN and PT.  Cory with [facility] notified of admission.  Per son Wille Glaser he changes patient's dressing every other day, and patient also goes to outpatient wound care center.    Son states him or patient's spouse will transport at discharge       Patient Goals and CMS Choice            Expected Discharge Plan and Services                                              Prior Living Arrangements/Services                       Activities of Daily Living Home Assistive Devices/Equipment: Hospital bed, Wheelchair ADL Screening (condition at time of admission) Patient's cognitive ability adequate to safely complete daily activities?: No Is the patient deaf or have difficulty hearing?: No Does the patient have difficulty seeing, even when wearing glasses/contacts?: No Does the patient have difficulty concentrating, remembering, or making decisions?: Yes Patient able to express need for assistance with ADLs?: No Does the patient have difficulty dressing or bathing?: Yes Independently performs ADLs?: No Communication: Independent Dressing (OT): Dependent Is this a change from baseline?: Change from baseline, expected to last >3 days Grooming: Needs assistance Is  this a change from baseline?: Change from baseline, expected to last >3 days Feeding: Independent Bathing: Dependent Is this a change from baseline?: Change from baseline, expected to last >3 days Toileting: Dependent Is this a change from baseline?: Change from baseline, expected to last >3days In/Out Bed: Dependent Is this a change from baseline?: Change from baseline, expected to last >3 days Walks in Home: Dependent Is this a change from baseline?: Change from baseline, expected to last >3 days Does the patient have difficulty walking or climbing stairs?: Yes Weakness of Legs: Both Weakness of Arms/Hands: None  Permission Sought/Granted                  Emotional Assessment              Admission diagnosis:  Lower urinary tract infectious disease [N39.0] NSTEMI (non-ST elevated myocardial infarction) ([place]) [I21.4] Severe sepsis ([place]) [A41.9, R65.20] Fever, unspecified fever cause [R50.9] Leukocytosis, unspecified type [D72.829] Sepsis, due to unspecified organism, unspecified whether acute organ dysfunction present [facility]) [A41.9] Ulcer of both feet, limited to breakdown of skin ([place]) [id number], L97.521] Patient Active Problem List   Diagnosis Date Noted   Fever 12/21/2022   Leukocytosis 12/20/2022  NSTEMI (non-ST elevated myocardial infarction) ([place]) 12/19/2022   Lower urinary tract infectious disease 12/19/2022   Type II diabetes mellitus with renal manifestations ([place]) 12/19/2022   Chronic kidney disease, stage 3a ([place]) 12/19/2022   DVT (deep venous thrombosis) ([place]) 12/19/2022   Ulcer of both feet, limited to breakdown of skin ([place]) 12/19/2022   Reactive thrombocytosis 11/04/2022   Acute deep vein thrombosis (DVT) of femoral vein of left lower extremity ([place]) 11/02/2022   Chronic ulcer of great toe of left foot with fat layer exposed ([place]) 11/02/2022   Anemia due to stage 3a chronic kidney disease ([place]) 11/02/2022   Acute on chronic urinary retention  11/02/2022   Thrombus 11/02/2022   Pressure injury of skin_sacrum and right hip [id number]   Acute metabolic encephalopathy [id number]   Sepsis ([place]) 08/22/2022   Acute diverticulitis 08/22/2022   Hydroureteronephrosis 08/22/2022   Hypokalemia 08/22/2022   Malnutrition of moderate degree 08/18/2022   Cellulitis of lower extremity 08/17/2022   Cellulitis of both lower extremities 08/16/2022   Diabetes mellitus without complication (HCC)    Gout    Chronic diastolic CHF (congestive heart failure) ([place])    Iron deficiency anemia    Diarrhea    Cervical spondylosis 03/09/2020   Lower limb ulcer, ankle, right, limited to breakdown of skin ([place]) 09/09/2018   Chronic gouty arthritis 04/29/2018   Generalized osteoarthritis of hand 02/12/2018   Hyperuricemia 02/12/2018   Swelling of finger 02/12/2018   PAD (peripheral artery disease) ([place]) 04/30/2017   Lymphedema 04/30/2017   Bilateral edema of lower extremity 04/03/2017   Ecchymosis 04/03/2017   Leg pain, bilateral 04/03/2017   Acquired hypothyroidism 01/24/2017   Benign essential hypertension 01/24/2017   History of mastectomy, right 01/24/2017   Primary osteoarthritis of left knee 02/29/2016   Cancer of right breast ([place]) 04/17/2015   Chronic pain 03/03/2015   Chronic pain of both knees 03/03/2015   Arthritis 01/11/2014   Soft tissue lesion of shoulder region 01/11/2014   Diabetes mellitus, type 2 ([place]) 01/11/2014   H/O renal calculi 01/11/2014   Hypertension 01/11/2014   HLD (hyperlipidemia) 01/11/2014   Neuropathy 01/11/2014   OP (osteoporosis) 01/11/2014   H/O malignant neoplasm of breast 01/11/2014   PCP:  Tracie Harrier, MD Pharmacy:   [place], [place] - [address] [address] Phone: [phone] Fax: [place], [place] - [facility] [address] AT [place] [facility] [place] [address] Phone:  [phone] Fax: [place] [id number], [place] AT [facility] [address] [place] [address] Phone: [phone] Fax: [phone]     Social Determinants of Health (SDOH) Social History: SDOH Screenings   Food Insecurity: No Food Insecurity (12/20/2022)  Housing: Low Risk  (12/20/2022)  Transportation Needs: No Transportation Needs (12/20/2022)  Utilities: Not At Risk (12/20/2022)  Depression (PHQ2-9): Low Risk  (01/29/2019)  Tobacco Use: Low Risk  (12/20/2022)   SDOH Interventions:     Readmission Risk Interventions    12/24/2022   11:39 AM 11/07/2022    4:05 PM 08/24/2022   10:41 AM  Readmission Risk Prevention Plan  Transportation Screening Complete Complete Complete  PCP or Specialist Appt within 3-5 Days   Complete  HRI or [place]   Not Complete  HRI or Home Care Consult comments   SW  will arrange this admission  Social Work Consult for [place] Planning/Counseling   Complete  Palliative Care Screening   Not Applicable  Medication Review Press photographer) Complete Complete Complete  HRI or [place] Not Applicable Complete

## 2022-12-24 NOTE — Progress Notes (Signed)
Physical Therapy Treatment Patient Details Name: [name] MRN: [id number] DOB: [birth year] Today's Date: 12/24/2022   History of Present Illness [name] is a 82 y.o. female with a history of dCHF, HTN, HLD, PVD, DVT (s/p left IVC on 11/06/22), CKD-3a, kidney stone, diverticulitis, anemia, gout, hypothyroidism, chronic pressure ulcer in right hip, sacral ulcer, chronic ulcer in both heels of feet. Patient presented secondary to altered mental status and fever with concern for sepsis. Hypotension managed with IV fluids successfully with the help of midodrine. Workup revealed enterococcus bacteremia in addition to bacteruria and infected heel ulcer. Hospitalization complicated by development of an NSTEMI. Infectious disease and cardiology consulted for help in management.    PT Comments    Pt is making good progress towards goals with ability to squat pivot transfer from chair to bed. Very painful in B LEs and needs assist for all readjustment once in bed. Follows commands well. Will continue to progress towards goals.  Recommendations for follow up therapy are one component of a multi-disciplinary discharge planning process, led by the attending physician.  Recommendations may be updated based on patient status, additional functional criteria and insurance authorization.  Follow Up Recommendations  Home health PT     Assistance Recommended at Discharge Intermittent Supervision/Assistance  Patient can return home with the following Two people to help with walking and/or transfers;A lot of help with bathing/dressing/bathroom;Assistance with cooking/housework;Direct supervision/assist for medications management;Assist for transportation   Equipment Recommendations  Wheelchair (measurements PT);Wheelchair cushion (measurements PT)    Recommendations for Other Services       Precautions / Restrictions Precautions Precautions: Fall Restrictions Weight Bearing Restrictions:  No     Mobility  Bed Mobility Overal bed mobility: Needs Assistance Bed Mobility: Sit to Supine       Sit to supine: Max assist   General bed mobility comments: needs heavy assist for bringing B LEs onto bed and repositioning. Step by step cues for all mobility    Transfers Overall transfer level: Needs assistance Equipment used: None Transfers: Bed to chair/wheelchair/BSC Sit to Stand: Max assist     Squat pivot transfers: Max assist     General transfer comment: follows commands, however very limited. SPT to bed with max assist.    Ambulation/Gait               General Gait Details: pt non-ambulatory at baseline   Stairs             Wheelchair Mobility    Modified Rankin (Stroke Patients Only)       Balance Overall balance assessment: Needs assistance Sitting-balance support: Bilateral upper extremity supported, Feet supported Sitting balance-Leahy Scale: Fair Sitting balance - Comments: upon arrival, pt sleeping in recliner with L lean almost falling out of chair   Standing balance support: Bilateral upper extremity supported Standing balance-Leahy Scale: Zero                              Cognition Arousal/Alertness: Awake/alert Behavior During Therapy: WFL for tasks assessed/performed, Anxious Overall Cognitive Status: No family/caregiver present to determine baseline cognitive functioning                                 General Comments: very anxious and resistant to initial assistance        Exercises      General Comments General comments (skin  integrity, edema, etc.): pressure sores on B heels and sacral area      Pertinent Vitals/Pain Pain Assessment Pain Assessment: Faces Faces Pain Scale: Hurts little more Pain Location: B LEs Pain Descriptors / Indicators: Sore, Aching Pain Intervention(s): Limited activity within patient's tolerance    Home Living Family/patient expects to be discharged  to:: Assisted living                 Home Equipment: BSC/3in1;Shower seat - built in;Toilet riser;Hospital bed;Rolling Walker (2 wheels) (w/c is broken) Additional Comments: reports living with spouse who can assist minimally    Prior Function            PT Goals (current goals can now be found in the care plan section) Acute Rehab PT Goals Patient Stated Goal: go home with help and family PT Goal Formulation: With patient/family Time For Goal Achievement: 01/06/23 Potential to Achieve Goals: Good Progress towards PT goals: Progressing toward goals    Frequency    Min 2X/week      PT Plan Current plan remains appropriate    Co-evaluation              AM-PAC PT "6 Clicks" Mobility   Outcome Measure  Help needed turning from your back to your side while in a flat bed without using bedrails?: A Lot Help needed moving from lying on your back to sitting on the side of a flat bed without using bedrails?: A Lot Help needed moving to and from a bed to a chair (including a wheelchair)?: Total Help needed standing up from a chair using your arms (e.g., wheelchair or bedside chair)?: Total Help needed to walk in hospital room?: Total Help needed climbing 3-5 steps with a railing? : Total 6 Click Score: 8    End of Session Equipment Utilized During Treatment: Gait belt Activity Tolerance: Patient tolerated treatment well Patient left: in bed;with bed alarm set Nurse Communication: Mobility status PT Visit Diagnosis: Muscle weakness (generalized) (M62.81);Difficulty in walking, not elsewhere classified (R26.2);Pain Pain - Right/Left: Left Pain - part of body: Knee     Time: 1331-1346 PT Time Calculation (min) (ACUTE ONLY): 15 min  Charges:  $Therapeutic Activity: 8-22 mins                     Greggory Stallion, PT, DPT, GCS [phone]    [place] 12/24/2022, 2:39 PM

## 2022-12-25 NOTE — Progress Notes (Deleted)
10/25/2022 4:21 PM   [name] Post 08-28-1941 [id number]  Referring provider: Tracie Harrier, MD [address] [facility] [place],  [place] [address]  Urological history: 1.  High risk hematuria -Non-smoker -History of microscopic hematuria with 4-10 RBC's -non-contrast CT (2019) -no malignancies -cysto (2019) -no malignancies -No complaints of gross heme -UA ***  2. Cystocele -seen on numerous CT's -appreciated on exam   Chief Complaint  Patient presents with   Urinary Retention    HPI: [name] is a 82 y.o. female who presents today for one month follow up.   She was admitted for sepsis since she was last seen by me and was found to be back in retention with 1000 L in the bladder.  She was started on Flomax during that admission.  The Foley was removed on December 28.  PVR ***  PMH: Past Medical History:  Diagnosis Date   Arthritis    Arthritis    Breast cancer ([place]) 1992   right breast with lumpectomy and rad tx   Cancer of right breast ([place]) 04/16/2013   right breast with mastectomy   Diabetes mellitus without complication ([place])    Gout    High cholesterol    Hyperlipidemia    Hypertension    Personal history of radiation therapy     Surgical History: Past Surgical History:  Procedure Laterality Date   ABDOMINAL HYSTERECTOMY     ANKLE FRACTURE SURGERY Right 2004   BREAST LUMPECTOMY Right 1992   positive   MASTECTOMY Right 2014   SHOULDER SURGERY Right 2010    Home Medications:  Allergies as of 10/25/2022       Reactions   Other Anaphylaxis   Anesthisia has been a health issue for her in the past.    Sulfa Antibiotics Rash        Medication List        Accurate as of October 25, 2022  4:21 PM. If you have any questions, ask your nurse or doctor.          acetaminophen 500 MG tablet Commonly known as: TYLENOL Take 500-1,000 mg by mouth 4 (four) times daily as needed for mild pain or moderate  pain.   allopurinol 300 MG tablet Commonly known as: ZYLOPRIM Take 300 mg by mouth daily.   atenolol 50 MG tablet Commonly known as: TENORMIN Take 50 mg by mouth daily.   atorvastatin 40 MG tablet Commonly known as: LIPITOR Take 40 mg by mouth daily.   cyanocobalamin 1000 MCG tablet Commonly known as: VITAMIN B12 Take 1,000 mcg by mouth daily.   diphenoxylate-atropine 2.5-0.025 MG tablet Commonly known as: LOMOTIL Take 1-2 tablets by mouth 3 (three) times daily as needed.   furosemide 40 MG tablet Commonly known as: LASIX Take 40 mg by mouth daily.   gentamicin cream 0.1 % Commonly known as: GARAMYCIN Apply 1 Application topically in the morning and at bedtime.   HYDROcodone-acetaminophen 7.5-325 MG tablet Commonly known as: NORCO Take 1 tablet by mouth 2 (two) times daily as needed for moderate pain or severe pain.   levothyroxine 88 MCG tablet Commonly known as: SYNTHROID Take 88 mcg by mouth daily.   lisinopril 5 MG tablet Commonly known as: ZESTRIL Take 5 mg by mouth daily.   metFORMIN 1000 MG tablet Commonly known as: GLUCOPHAGE Take 1,000 mg by mouth in the morning and at bedtime.   sitaGLIPtin 50 MG tablet Commonly known as: JANUVIA Take 50 mg by  mouth daily.        Allergies:  Allergies  Allergen Reactions   Other Anaphylaxis    Anesthisia has been a health issue for her in the past.    Sulfa Antibiotics Rash    Family History: Family History  Problem Relation Age of Onset   Diabetes Father    Breast cancer Neg Hx     Social History:  reports that she has never smoked. She has never been exposed to tobacco smoke. She has never used smokeless tobacco. She reports that she does not drink alcohol and does not use drugs.  ROS: Pertinent ROS in HPI  Physical Exam: There were no vitals taken for this visit.  Constitutional:  Well nourished. Alert and oriented, No acute distress. HEENT: [place] AT, moist mucus membranes.  Trachea midline, no  masses. Cardiovascular: No clubbing, cyanosis, or edema. Respiratory: Normal respiratory effort, no increased work of breathing. GU: No CVA tenderness.  No bladder fullness or masses. Vulvovaginal atrophy w/ pallor, loss of rugae, introital retraction, excoriations.  Vulvar thinning, fusion of labia, clitoral hood retraction, prominent urethral meatus.   *** external genitalia, *** pubic hair distribution, no lesions.  Normal urethral meatus, no lesions, no prolapse, no discharge.   No urethral masses, tenderness and/or tenderness. No bladder fullness, tenderness or masses. *** vagina mucosa, *** estrogen effect, no discharge, no lesions, *** pelvic support, *** cystocele and *** rectocele noted.  No cervical motion tenderness.  Uterus is freely mobile and non-fixed.  No adnexal/parametria masses or tenderness noted.  Anus and perineum are without rashes or lesions.   ***  Neurologic: Grossly intact, no focal deficits, moving all 4 extremities. Psychiatric: Normal mood and affect.     Laboratory Data:    Latest Ref Rng & Units 11/09/2022    5:19 AM 11/08/2022    1:32 PM 11/08/2022    4:56 AM  CBC  WBC 4.0 - 10.5 K/uL 10.5   11.5   Hemoglobin 12.0 - 15.0 g/dL 8.0  7.6  7.4   Hematocrit 36.0 - 46.0 % 26.4   24.6   Platelets 150 - 400 K/uL 328   320        Latest Ref Rng & Units 11/07/2022    6:01 AM 11/06/2022    1:30 PM 11/06/2022    5:32 AM  BMP  Glucose 70 - 99 mg/dL 123   119   BUN 8 - 23 mg/dL 16   16   Creatinine 0.44 - 1.00 mg/dL 0.79  0.77  0.80   Sodium 135 - 145 mmol/L 141   141   Potassium 3.5 - 5.1 mmol/L 3.8   3.6   Chloride 98 - 111 mmol/L 114   111   CO2 22 - 32 mmol/L 23   21   Calcium 8.9 - 10.3 mg/dL 7.7   7.8    I have reviewed the labs.    Pertinent Imaging: ***  Assessment & Plan:    1. Urinary retention -Resolved for the time being -reviewed return precautions   2. Microscopic hematuria -will recheck UA upon return in one month  Return in about 1  month (around 11/25/2022) for PVR and OAB questionnaire.  These notes generated with voice recognition software. I apologize for typographical errors.  [place], [place] [address]  [place], [place] [address] [phone]

## 2022-12-26 ENCOUNTER — Ambulatory Visit: Payer: PPO | Admitting: Urology

## 2022-12-26 DIAGNOSIS — R339 Retention of urine, unspecified: Secondary | ICD-10-CM

## 2022-12-26 DIAGNOSIS — R3129 Other microscopic hematuria: Secondary | ICD-10-CM

## 2022-12-26 DIAGNOSIS — I214 Non-ST elevation (NSTEMI) myocardial infarction: Secondary | ICD-10-CM | POA: Diagnosis not present

## 2022-12-26 DIAGNOSIS — D72829 Elevated white blood cell count, unspecified: Secondary | ICD-10-CM | POA: Diagnosis not present

## 2022-12-27 DIAGNOSIS — G8929 Other chronic pain: Secondary | ICD-10-CM | POA: Diagnosis not present

## 2022-12-27 DIAGNOSIS — I700 Atherosclerosis of aorta: Secondary | ICD-10-CM | POA: Diagnosis not present

## 2022-12-27 DIAGNOSIS — M5410 Radiculopathy, site unspecified: Secondary | ICD-10-CM | POA: Diagnosis not present

## 2022-12-27 DIAGNOSIS — M1712 Unilateral primary osteoarthritis, left knee: Secondary | ICD-10-CM | POA: Diagnosis not present

## 2022-12-27 DIAGNOSIS — I70201 Unspecified atherosclerosis of native arteries of extremities, right leg: Secondary | ICD-10-CM | POA: Diagnosis not present

## 2022-12-27 DIAGNOSIS — I82412 Acute embolism and thrombosis of left femoral vein: Secondary | ICD-10-CM | POA: Diagnosis not present

## 2022-12-27 DIAGNOSIS — L89154 Pressure ulcer of sacral region, stage 4: Secondary | ICD-10-CM | POA: Diagnosis not present

## 2022-12-27 DIAGNOSIS — I5032 Chronic diastolic (congestive) heart failure: Secondary | ICD-10-CM | POA: Diagnosis not present

## 2022-12-27 DIAGNOSIS — I890 Lymphedema, not elsewhere classified: Secondary | ICD-10-CM | POA: Diagnosis not present

## 2022-12-27 DIAGNOSIS — M47812 Spondylosis without myelopathy or radiculopathy, cervical region: Secondary | ICD-10-CM | POA: Diagnosis not present

## 2022-12-27 DIAGNOSIS — L89892 Pressure ulcer of other site, stage 2: Secondary | ICD-10-CM | POA: Diagnosis not present

## 2022-12-27 DIAGNOSIS — E1140 Type 2 diabetes mellitus with diabetic neuropathy, unspecified: Secondary | ICD-10-CM | POA: Diagnosis not present

## 2022-12-27 DIAGNOSIS — M1A9XX0 Chronic gout, unspecified, without tophus (tophi): Secondary | ICD-10-CM | POA: Diagnosis not present

## 2022-12-27 DIAGNOSIS — E1151 Type 2 diabetes mellitus with diabetic peripheral angiopathy without gangrene: Secondary | ICD-10-CM | POA: Diagnosis not present

## 2022-12-27 DIAGNOSIS — E7800 Pure hypercholesterolemia, unspecified: Secondary | ICD-10-CM | POA: Diagnosis not present

## 2022-12-27 DIAGNOSIS — I110 Hypertensive heart disease with heart failure: Secondary | ICD-10-CM | POA: Diagnosis not present

## 2022-12-27 DIAGNOSIS — L89213 Pressure ulcer of right hip, stage 3: Secondary | ICD-10-CM | POA: Diagnosis not present

## 2022-12-27 DIAGNOSIS — M8580 Other specified disorders of bone density and structure, unspecified site: Secondary | ICD-10-CM | POA: Diagnosis not present

## 2022-12-27 DIAGNOSIS — M792 Neuralgia and neuritis, unspecified: Secondary | ICD-10-CM | POA: Diagnosis not present

## 2022-12-27 DIAGNOSIS — N1330 Unspecified hydronephrosis: Secondary | ICD-10-CM | POA: Diagnosis not present

## 2022-12-27 DIAGNOSIS — G9341 Metabolic encephalopathy: Secondary | ICD-10-CM | POA: Diagnosis not present

## 2022-12-27 DIAGNOSIS — I82422 Acute embolism and thrombosis of left iliac vein: Secondary | ICD-10-CM | POA: Diagnosis not present

## 2022-12-27 DIAGNOSIS — L89613 Pressure ulcer of right heel, stage 3: Secondary | ICD-10-CM | POA: Diagnosis not present

## 2022-12-27 DIAGNOSIS — E039 Hypothyroidism, unspecified: Secondary | ICD-10-CM | POA: Diagnosis not present

## 2022-12-28 LAB — CULTURE, BLOOD (ROUTINE X 2)
Culture: NO GROWTH
Culture: NO GROWTH

## 2023-01-01 DIAGNOSIS — I10 Essential (primary) hypertension: Secondary | ICD-10-CM | POA: Diagnosis not present

## 2023-01-01 DIAGNOSIS — R531 Weakness: Secondary | ICD-10-CM | POA: Diagnosis not present

## 2023-01-01 DIAGNOSIS — E039 Hypothyroidism, unspecified: Secondary | ICD-10-CM | POA: Diagnosis not present

## 2023-01-01 DIAGNOSIS — Z09 Encounter for follow-up examination after completed treatment for conditions other than malignant neoplasm: Secondary | ICD-10-CM | POA: Diagnosis not present

## 2023-01-01 DIAGNOSIS — E876 Hypokalemia: Secondary | ICD-10-CM | POA: Diagnosis not present

## 2023-01-01 DIAGNOSIS — N390 Urinary tract infection, site not specified: Secondary | ICD-10-CM | POA: Diagnosis not present

## 2023-01-01 DIAGNOSIS — D649 Anemia, unspecified: Secondary | ICD-10-CM | POA: Diagnosis not present

## 2023-01-01 DIAGNOSIS — E440 Moderate protein-calorie malnutrition: Secondary | ICD-10-CM | POA: Diagnosis not present

## 2023-01-01 DIAGNOSIS — E8809 Other disorders of plasma-protein metabolism, not elsewhere classified: Secondary | ICD-10-CM | POA: Diagnosis not present

## 2023-01-01 DIAGNOSIS — L89159 Pressure ulcer of sacral region, unspecified stage: Secondary | ICD-10-CM | POA: Diagnosis not present

## 2023-01-01 DIAGNOSIS — Z853 Personal history of malignant neoplasm of breast: Secondary | ICD-10-CM | POA: Diagnosis not present

## 2023-01-01 DIAGNOSIS — B952 Enterococcus as the cause of diseases classified elsewhere: Secondary | ICD-10-CM | POA: Diagnosis not present

## 2023-01-01 DIAGNOSIS — L89150 Pressure ulcer of sacral region, unstageable: Secondary | ICD-10-CM | POA: Diagnosis not present

## 2023-01-02 ENCOUNTER — Encounter (HOSPITAL_BASED_OUTPATIENT_CLINIC_OR_DEPARTMENT_OTHER): Payer: PPO | Admitting: Internal Medicine

## 2023-01-02 DIAGNOSIS — M792 Neuralgia and neuritis, unspecified: Secondary | ICD-10-CM | POA: Diagnosis not present

## 2023-01-02 DIAGNOSIS — L89320 Pressure ulcer of left buttock, unstageable: Secondary | ICD-10-CM | POA: Insufficient documentation

## 2023-01-02 DIAGNOSIS — D638 Anemia in other chronic diseases classified elsewhere: Secondary | ICD-10-CM | POA: Diagnosis present

## 2023-01-02 DIAGNOSIS — G9341 Metabolic encephalopathy: Secondary | ICD-10-CM | POA: Diagnosis not present

## 2023-01-02 DIAGNOSIS — L97822 Non-pressure chronic ulcer of other part of left lower leg with fat layer exposed: Secondary | ICD-10-CM | POA: Insufficient documentation

## 2023-01-02 DIAGNOSIS — E440 Moderate protein-calorie malnutrition: Secondary | ICD-10-CM | POA: Diagnosis present

## 2023-01-02 DIAGNOSIS — X58XXXA Exposure to other specified factors, initial encounter: Secondary | ICD-10-CM | POA: Insufficient documentation

## 2023-01-02 DIAGNOSIS — R402411 Glasgow coma scale score 13-15, in the field [EMT or ambulance]: Secondary | ICD-10-CM | POA: Diagnosis not present

## 2023-01-02 DIAGNOSIS — E1151 Type 2 diabetes mellitus with diabetic peripheral angiopathy without gangrene: Secondary | ICD-10-CM | POA: Diagnosis present

## 2023-01-02 DIAGNOSIS — L89513 Pressure ulcer of right ankle, stage 3: Secondary | ICD-10-CM | POA: Diagnosis present

## 2023-01-02 DIAGNOSIS — E039 Hypothyroidism, unspecified: Secondary | ICD-10-CM | POA: Diagnosis present

## 2023-01-02 DIAGNOSIS — E7800 Pure hypercholesterolemia, unspecified: Secondary | ICD-10-CM | POA: Diagnosis not present

## 2023-01-02 DIAGNOSIS — Z853 Personal history of malignant neoplasm of breast: Secondary | ICD-10-CM | POA: Insufficient documentation

## 2023-01-02 DIAGNOSIS — E11622 Type 2 diabetes mellitus with other skin ulcer: Secondary | ICD-10-CM | POA: Insufficient documentation

## 2023-01-02 DIAGNOSIS — Z993 Dependence on wheelchair: Secondary | ICD-10-CM | POA: Insufficient documentation

## 2023-01-02 DIAGNOSIS — L89892 Pressure ulcer of other site, stage 2: Secondary | ICD-10-CM | POA: Diagnosis not present

## 2023-01-02 DIAGNOSIS — M47812 Spondylosis without myelopathy or radiculopathy, cervical region: Secondary | ICD-10-CM | POA: Diagnosis not present

## 2023-01-02 DIAGNOSIS — M5410 Radiculopathy, site unspecified: Secondary | ICD-10-CM | POA: Diagnosis not present

## 2023-01-02 DIAGNOSIS — Z9011 Acquired absence of right breast and nipple: Secondary | ICD-10-CM | POA: Insufficient documentation

## 2023-01-02 DIAGNOSIS — I82412 Acute embolism and thrombosis of left femoral vein: Secondary | ICD-10-CM | POA: Diagnosis not present

## 2023-01-02 DIAGNOSIS — R627 Adult failure to thrive: Secondary | ICD-10-CM | POA: Diagnosis not present

## 2023-01-02 DIAGNOSIS — I82422 Acute embolism and thrombosis of left iliac vein: Secondary | ICD-10-CM | POA: Diagnosis not present

## 2023-01-02 DIAGNOSIS — L98499 Non-pressure chronic ulcer of skin of other sites with unspecified severity: Secondary | ICD-10-CM | POA: Diagnosis not present

## 2023-01-02 DIAGNOSIS — I110 Hypertensive heart disease with heart failure: Secondary | ICD-10-CM | POA: Insufficient documentation

## 2023-01-02 DIAGNOSIS — L89329 Pressure ulcer of left buttock, unspecified stage: Secondary | ICD-10-CM | POA: Diagnosis not present

## 2023-01-02 DIAGNOSIS — D72829 Elevated white blood cell count, unspecified: Secondary | ICD-10-CM | POA: Diagnosis not present

## 2023-01-02 DIAGNOSIS — I87312 Chronic venous hypertension (idiopathic) with ulcer of left lower extremity: Secondary | ICD-10-CM | POA: Diagnosis present

## 2023-01-02 DIAGNOSIS — N1330 Unspecified hydronephrosis: Secondary | ICD-10-CM | POA: Diagnosis not present

## 2023-01-02 DIAGNOSIS — L89613 Pressure ulcer of right heel, stage 3: Secondary | ICD-10-CM | POA: Diagnosis not present

## 2023-01-02 DIAGNOSIS — G8929 Other chronic pain: Secondary | ICD-10-CM | POA: Diagnosis not present

## 2023-01-02 DIAGNOSIS — R609 Edema, unspecified: Secondary | ICD-10-CM | POA: Diagnosis not present

## 2023-01-02 DIAGNOSIS — N182 Chronic kidney disease, stage 2 (mild): Secondary | ICD-10-CM | POA: Diagnosis not present

## 2023-01-02 DIAGNOSIS — I890 Lymphedema, not elsewhere classified: Secondary | ICD-10-CM | POA: Insufficient documentation

## 2023-01-02 DIAGNOSIS — E785 Hyperlipidemia, unspecified: Secondary | ICD-10-CM | POA: Insufficient documentation

## 2023-01-02 DIAGNOSIS — M545 Low back pain, unspecified: Secondary | ICD-10-CM | POA: Diagnosis not present

## 2023-01-02 DIAGNOSIS — R6510 Systemic inflammatory response syndrome (SIRS) of non-infectious origin without acute organ dysfunction: Secondary | ICD-10-CM | POA: Diagnosis not present

## 2023-01-02 DIAGNOSIS — L089 Local infection of the skin and subcutaneous tissue, unspecified: Secondary | ICD-10-CM | POA: Diagnosis not present

## 2023-01-02 DIAGNOSIS — Z66 Do not resuscitate: Secondary | ICD-10-CM | POA: Diagnosis not present

## 2023-01-02 DIAGNOSIS — M1A9XX0 Chronic gout, unspecified, without tophus (tophi): Secondary | ICD-10-CM | POA: Diagnosis not present

## 2023-01-02 DIAGNOSIS — Z515 Encounter for palliative care: Secondary | ICD-10-CM | POA: Diagnosis not present

## 2023-01-02 DIAGNOSIS — L89154 Pressure ulcer of sacral region, stage 4: Secondary | ICD-10-CM | POA: Diagnosis not present

## 2023-01-02 DIAGNOSIS — R531 Weakness: Secondary | ICD-10-CM | POA: Diagnosis not present

## 2023-01-02 DIAGNOSIS — M86172 Other acute osteomyelitis, left ankle and foot: Secondary | ICD-10-CM | POA: Insufficient documentation

## 2023-01-02 DIAGNOSIS — S91302A Unspecified open wound, left foot, initial encounter: Secondary | ICD-10-CM | POA: Insufficient documentation

## 2023-01-02 DIAGNOSIS — R6520 Severe sepsis without septic shock: Secondary | ICD-10-CM | POA: Diagnosis present

## 2023-01-02 DIAGNOSIS — C50911 Malignant neoplasm of unspecified site of right female breast: Secondary | ICD-10-CM | POA: Diagnosis present

## 2023-01-02 DIAGNOSIS — E1140 Type 2 diabetes mellitus with diabetic neuropathy, unspecified: Secondary | ICD-10-CM | POA: Diagnosis present

## 2023-01-02 DIAGNOSIS — I5032 Chronic diastolic (congestive) heart failure: Secondary | ICD-10-CM | POA: Diagnosis present

## 2023-01-02 DIAGNOSIS — I10 Essential (primary) hypertension: Secondary | ICD-10-CM | POA: Diagnosis not present

## 2023-01-02 DIAGNOSIS — T148XXA Other injury of unspecified body region, initial encounter: Secondary | ICD-10-CM | POA: Diagnosis not present

## 2023-01-02 DIAGNOSIS — E1122 Type 2 diabetes mellitus with diabetic chronic kidney disease: Secondary | ICD-10-CM | POA: Diagnosis present

## 2023-01-02 DIAGNOSIS — L89210 Pressure ulcer of right hip, unstageable: Secondary | ICD-10-CM | POA: Insufficient documentation

## 2023-01-02 DIAGNOSIS — L89620 Pressure ulcer of left heel, unstageable: Secondary | ICD-10-CM | POA: Insufficient documentation

## 2023-01-02 DIAGNOSIS — L89890 Pressure ulcer of other site, unstageable: Secondary | ICD-10-CM | POA: Diagnosis not present

## 2023-01-02 DIAGNOSIS — E8809 Other disorders of plasma-protein metabolism, not elsewhere classified: Secondary | ICD-10-CM | POA: Diagnosis present

## 2023-01-02 DIAGNOSIS — M1712 Unilateral primary osteoarthritis, left knee: Secondary | ICD-10-CM | POA: Diagnosis not present

## 2023-01-02 DIAGNOSIS — I130 Hypertensive heart and chronic kidney disease with heart failure and stage 1 through stage 4 chronic kidney disease, or unspecified chronic kidney disease: Secondary | ICD-10-CM | POA: Diagnosis present

## 2023-01-02 DIAGNOSIS — L03116 Cellulitis of left lower limb: Secondary | ICD-10-CM | POA: Diagnosis present

## 2023-01-02 DIAGNOSIS — L8990 Pressure ulcer of unspecified site, unspecified stage: Secondary | ICD-10-CM | POA: Diagnosis not present

## 2023-01-02 DIAGNOSIS — A419 Sepsis, unspecified organism: Secondary | ICD-10-CM | POA: Diagnosis present

## 2023-01-02 DIAGNOSIS — L89610 Pressure ulcer of right heel, unstageable: Secondary | ICD-10-CM | POA: Insufficient documentation

## 2023-01-02 DIAGNOSIS — L97522 Non-pressure chronic ulcer of other part of left foot with fat layer exposed: Secondary | ICD-10-CM | POA: Diagnosis not present

## 2023-01-02 DIAGNOSIS — I700 Atherosclerosis of aorta: Secondary | ICD-10-CM | POA: Diagnosis not present

## 2023-01-02 DIAGNOSIS — M4628 Osteomyelitis of vertebra, sacral and sacrococcygeal region: Secondary | ICD-10-CM | POA: Diagnosis present

## 2023-01-02 DIAGNOSIS — I70201 Unspecified atherosclerosis of native arteries of extremities, right leg: Secondary | ICD-10-CM | POA: Diagnosis not present

## 2023-01-02 DIAGNOSIS — L89213 Pressure ulcer of right hip, stage 3: Secondary | ICD-10-CM | POA: Diagnosis present

## 2023-01-02 DIAGNOSIS — I959 Hypotension, unspecified: Secondary | ICD-10-CM | POA: Diagnosis not present

## 2023-01-02 DIAGNOSIS — L98419 Non-pressure chronic ulcer of buttock with unspecified severity: Secondary | ICD-10-CM | POA: Diagnosis not present

## 2023-01-02 DIAGNOSIS — W19XXXA Unspecified fall, initial encounter: Secondary | ICD-10-CM | POA: Diagnosis not present

## 2023-01-02 DIAGNOSIS — L89153 Pressure ulcer of sacral region, stage 3: Secondary | ICD-10-CM | POA: Diagnosis present

## 2023-01-02 DIAGNOSIS — Z743 Need for continuous supervision: Secondary | ICD-10-CM | POA: Diagnosis not present

## 2023-01-02 DIAGNOSIS — L89159 Pressure ulcer of sacral region, unspecified stage: Secondary | ICD-10-CM | POA: Diagnosis not present

## 2023-01-02 DIAGNOSIS — E1169 Type 2 diabetes mellitus with other specified complication: Secondary | ICD-10-CM | POA: Diagnosis present

## 2023-01-02 DIAGNOSIS — M8580 Other specified disorders of bone density and structure, unspecified site: Secondary | ICD-10-CM | POA: Diagnosis not present

## 2023-01-02 DIAGNOSIS — S71001A Unspecified open wound, right hip, initial encounter: Secondary | ICD-10-CM | POA: Diagnosis not present

## 2023-01-02 DIAGNOSIS — M869 Osteomyelitis, unspecified: Secondary | ICD-10-CM | POA: Diagnosis not present

## 2023-01-03 ENCOUNTER — Ambulatory Visit: Payer: PPO | Admitting: Physician Assistant

## 2023-01-03 ENCOUNTER — Telehealth: Payer: PPO | Admitting: Infectious Diseases

## 2023-01-03 NOTE — Progress Notes (Signed)
[name] ([id number]) [id number] Page 1 of 11 Visit Report for 01/02/2023 Chief Complaint Document Details Patient Name: Date of Service: [name], [name]. 01/02/2023 1:00 PM Medical Record Number: [id number] Patient Account Number: [id number] Date of Birth/Sex: Treating RN: 1941-10-24 (82 y.o. [name] Primary Care Provider: Tracie Harrier Other Clinician: Massie Kluver Referring Provider: Treating Provider/Extender: Arn Medal Weeks in Treatment: 14 Information Obtained from: Patient Chief Complaint 09/26/2022; left lower extremity wound, right heel wound and left buttocks wound Electronic Signature(s) Signed: 01/02/2023 4:39:06 PM By: Kalman Shan DO Entered By: Kalman Shan on 01/02/2023 14:32:18 -------------------------------------------------------------------------------- HPI Details Patient Name: Date of Service: [name], [name]. 01/02/2023 1:00 PM Medical Record Number: [id number] Patient Account Number: [id number] Date of Birth/Sex: Treating RN: November 10, 1941 (82 y.o. [name] Primary Care Provider: Tracie Harrier Other Clinician: Massie Kluver Referring Provider: Treating Provider/Extender: Arn Medal Weeks in Treatment: 14 History of Present Illness HPI Description: 82 year old patient who comes with a referral for bilateral lower extremity edema and a lower extremity ulceration and has been sent by her PCP Dr. Placido Sou. I understand the patient was recently put on amoxicillin and doxycycline but could not tolerate the amoxicillin. doxycycline course was completed. a BNP and EKG was supposed to be normal and the patient did not have any dyspnea. the patient has been on a diuretic. The patient was also prescribed a pair of elastic compression stockings of the 20-30 mmHg pressure variety. x-ray of the right ankle was done on 09/20/2015 and showed  posttraumatic and postsurgical changes of the right ankle with secondary degenerative changes of the tibiotalar joint and to a lesser degree the subtalar joint. No definite acute bony abnormalities are noted. Past medical history significant for diabetes mellitus, hypertension, hyperlipidemia, right breast cancer treated with a mastectomy in 2014. She has never smoked. 10/24/2015 -- she had delayed her vascular test because of her husband surgery but she is now ready to get him taken care of. He is also unable to use compression stockings and hence we will need to order her Juzo wraps. 10/31/2015-- was seen by Dr. Lucky Cowboy on 10/28/2015. She had a left lower extremity arterial duplex done at his office a couple of years ago and that was essentially normal. Today they performed a venous duplex which revealed no evidence of deep vein thrombosis, superficial thrombophlebitis, no venous reflex was seen on the right and a minimal amount of reflux was seen on the left great saphenous vein but no significant reflux was seen. Impression was that there was a component of lymphedema present from a previous surgery and he would recommend compression stockings and leg elevation. [name], [name] ([id number]) [id number] Page 2 of 11 Readmission: 07-24-2022 upon evaluation today patient presents for initial inspection here in our clinic concerning issues that she has been having with her legs this is actually been going on for several years according to what her family member with her today tells me as well as what the patient reiterates as well. She is currently most recently been seeing Dr. Vickki Muff and subsequently he had her in [place] boots. However 2 weeks ago he referred her to [place] and then subsequently took her out of the [place] boot wraps at that point. At this time the left leg looks to be worse in the right leg currently. She is on Lasix and lisinopril with hydrochlorothiazide she has  high blood pressure she also has issues currently with lower extremity swelling  and edema which has been an ongoing issue for her as well. Patient does have a history of chronic venous hypertension, lymphedema, diabetes mellitus type 2, hypertension, peripheral vascular disease, and neuropathy. Currently she is on Lasix as well as lisinopril with hydrochlorothiazide. 08-02-2022 upon evaluation today patient presents for reevaluation the good news is she is actually doing somewhat better in regard to the wound and the overall appearance and sinuses. The unfortunate thing is her infection really is not significantly improved we did have to switch out her antibiotic once we got that final result back and I switched her to Levaquin and away from the doxycycline. Unfortunately the doxycycline had been doing poorly for her. In fact she had had diarrhea from the time she started taking it on Friday and she is still having it when she shows up today for evaluation. Again I was not aware of this and obviously she does appear to be somewhat dehydrated as well based on what I see. My concern which I discussed with the patient today is the possibility of a C. difficile infection. With that being said fact this started immediately upon taking the doxycycline makes me think that it was just the medicine and is not completely out of her system yet despite having taken the last dose Tuesday morning. Nonetheless with what we are seeing currently I want to be sure that reason I Minna contact her primary care provider and see if a would be willing to see her and test for C. difficile infection. 08-07-2022 upon evaluation today patient appears to be doing well currently in regard to her wounds which are actually measuring much better this is great news. Fortunately I do not see any signs of active infection locally or systemically at this time which is great as well. The good news is she was tested for a C. difficile  infection and it was negative. I am very pleased and thankful for primary care provider for doing that so this means that she was just having a severe reaction to the doxycycline we have added that to her allergy list at this point. 08-14-2022 upon evaluation today patient appears to be doing well currently in regard to her dehydration she is actually significantly improved compared to last time I saw her last week. With that being said I do not see any evidence of active infection systemically at this time which is great news. However locally she still does appear to have cellulitis in left lower extremity. I discussed with her today that I do believe she would benefit from going ahead and starting the [place]. Again we will concerned about the C. difficile infection of the diarrhea but that this turned out to be just an issue with a reaction to the doxycycline. My hope is that the Levaquin will not cause her any complication and will be able to treat the infection. I did review her arterial study as well which was dated on 07-31-2022. It showed that she had a ABI on the right of 1.30 and on the left of 1.27 with a TBI on the right of 1.12 and the left 1.21 this is a normal arterial study. Again on the patient's wound culture she actually did show evidence of Proteus, Morganella, and MRSA. Levaquin is a good option here across the board. 09/26/2022 Ms. Diane Breach is a 82 year old female with a past medical history of type 2 diabetes currently controlled on oral agents, venous insufficiency/lymphedema, right breast cancer and chronic diastolic heart failure  that presents to the clinic for a 1 month history of nonhealing wounds to the left lower extremity, right heel and left buttocks. She states that the buttocks wound and the right heel wound developed while she was in the [name]. She was admitted on 08/22/2022 for severe sepsis secondary to acute sigmoid and distal colonic diverticulitis. At  that time it was noted she had a stage I decubitus ulcer to her bilateral buttocks. A wound to the heel was not mentioned. She currently denies systemic signs of infection. She came into clinic in a blue gown with no undergarments. She has not been dressing the wounds. She states she just recently obtained home health and they are coming out for the first time this week. She is seen in our clinic often for lower extremity wounds secondary to venous insufficiency. 11/22; patient presents for follow-up. Son is present during the encounter. Per son it sounds like they are not doing any dressing changes. She states she has home health but they have not come out. She is going to let us know which home health agency she has been approved for so we can send orders. Currently she denies signs of infection. 12/13; patient presents for follow-up. Patient has home health and they are coming out once a week. It appears that there has not been any dressing changes except for with home health. Patient has a newly discovered wound to the left foot. There is exposed bone. There is slight erythema and increased warmth to the surrounding tissue. Patient is completely unaware of this wound. 12/20; patient presents for follow-up. She has been taking her oral antibiotics. She now has an eschar and wound to the right hip. She has been using Dakin's wet-to-dry dressings to the left foot wound and buttocks wound. She has been using Medihoney and silver alginate to the right heel wound. She currently denies systemic signs of infection. She is mainly bedbound or in a wheelchair. 1/10; patient presents for follow-up. Patient had her left second toenail removed by Dr. Vickki Muff, podiatry on 12/21. Unfortunately she did not feel well and she was admitted to the [name] for sepsis. Her right heel wound was thought to be infected and this was debrided in the OR by Dr. Vickki Muff. Culture and bone biopsy had no growth. She has been using  Medihoney to all the wound beds except for the lefty buttocks wound for which she uses Dakin's wet-to-dry dressings. She has developed a pressure injury to the left heel now. This is despite having Prevalon boots. Although she is not wearing them today. She has completed 4 weeks of Augmentin and also a week of doxycycline for osteomyelitis of the left foot. Her left foot wound no longer probes to bone. Currently she denies signs of infection. 1/24; patient presents for follow-up. She has been using Medihoney and Hydrofera Blue to the wound beds except for this buttocks wound she has been using Dakin's wet-to-dry dressings. She has developed 2 new wounds 1 to the left heel and the other to the right medial ankle. She claims she is wearing her Prevalon boots all the time although she does not have them on today. She currently denies signs of infection. 2/21; patient presents for follow-up. She was recently hospitalized on 12/19/2022 for sepsis secondary to Enterococcus bacillus bacteremia. She was treated with IV antibiotics and has completed her discharge oral antibiotics. She had a CT scanning of the abdomen/pelvis without acute abnormalities. Husband is present with patient today. They have been using  Dakin's wet-to-dry dressings to the sacral wound and hip wound and Hydrofera Blue and Medihoney to the feet wounds. They are not changing the dressings daily. It is unclear how often they actually change the dressings. She has been wearing her Prevalon boots at night but not during the day. She currently denies systemic signs of infection. Electronic Signature(s) Signed: 01/02/2023 4:39:06 PM By: Kalman Shan DO Entered By: Kalman Shan on 01/02/2023 14:37:49 [employer], [name] ([id number]) [id number] Page 3 of 11 -------------------------------------------------------------------------------- Physical Exam Details Patient Name: Date of Service: [name], [name] NNE  [name]. 01/02/2023 1:00 PM Medical Record Number: [id number] Patient Account Number: [id number] Date of Birth/Sex: Treating RN: [birth year] (82 y.o. [name] Primary Care Provider: Tracie Harrier Other Clinician: Massie Kluver Referring Provider: Treating Provider/Extender: Arn Medal Weeks in Treatment: 14 Constitutional . Cardiovascular . Psychiatric . Notes T the right heel there is an open wound with nonviable tissue and mostly granulation tissue. T the medial malleolus there is an open wound with granulation o o tissue and tightly adhered nonviable tissue. T the left buttocks there is a large open wound with granulation tissue. T the left foot first metatarsal there is an o o open wound with granulation tissue. T the left heel there is an open wound with eschar throughout. T the right hip there is a wound with non viable throughout, o o Now with pockets of increased depth. No signs of overt acute infection. Electronic Signature(s) Signed: 01/02/2023 4:39:06 PM By: Kalman Shan DO Entered By: Kalman Shan on 01/02/2023 14:46:32 -------------------------------------------------------------------------------- Physician Orders Details Patient Name: Date of Service: [facility], [name]. 01/02/2023 1:00 PM Medical Record Number: [id number] Patient Account Number: [id number] Date of Birth/Sex: Treating RN: 08/21/1941 (82 y.o. [name] Primary Care Provider: Tracie Harrier Other Clinician: Massie Kluver Referring Provider: Treating Provider/Extender: Arn Medal Weeks in Treatment: 63 Verbal / Phone Orders: No Diagnosis Coding Follow-up Appointments Return Appointment in 1 week. [place] for wound care. May utilize formulary equivalent dressing for wound treatment orders unless otherwise specified. Home Health Nurse may visit PRN to address patients wound care needs. Alvis Lemmings  [phone] Scheduled days for dressing changes to be completed; exception, patient has scheduled wound care visit that day. - Home health to change once weekly and son will change dressing once weekly, patient to be seen on Wednesday's in office [address] KATREENA, ACHEY ([id number]) [id number] Page 4 of 11 Wash wounds with antibacterial soap and water. No tub bath. Anesthetic (Use 'Patient Medications' Section for Anesthetic Order Entry) Lidocaine applied to wound bed Off-Loading Other: - wear prevalon heel boots at all times Wound Treatment Wound #10 - Metatarsal head first Wound Laterality: Left Cleanser: Dakin 16 (oz) 0.25 (Generic) 3 x Per Week/30 Days Discharge Instructions: Use as directed. Cleanser: Soap and Water 3 x Per Week/30 Days Discharge Instructions: Gently cleanse wound with antibacterial soap, rinse and pat dry prior to dressing wounds Prim Dressing: Honey: Activon Honey Gel, 25 (g) Tube ary 3 x Per Week/30 Days Prim Dressing: Hydrofera Blue Ready Transfer Foam, 2.5x2.5 (in/in) (Dispense As Written) 3 x Per Week/30 Days ary Discharge Instructions: Apply Hydrofera Blue Ready to wound bed as directed Secondary Dressing: ABD Pad 5x9 (in/in) 3 x Per Week/30 Days Discharge Instructions: Cover with ABD pad Secured With: Medipore T - 68M Medipore H Soft Cloth Surgical T ape ape, 2x2 (in/yd) 3 x Per Week/30 Days Wound #11 - Ischial Tuberosity Wound Laterality:  Right Cleanser: Dakin 16 (oz) 0.25 (Generic) 1 x Per Day/30 Days Discharge Instructions: Use as directed. Cleanser: Soap and Water 1 x Per Day/30 Days Discharge Instructions: Gently cleanse wound with antibacterial soap, rinse and pat dry prior to dressing wounds Prim Dressing: Gauze 1 x Per Day/30 Days ary Discharge Instructions: dakins wet to dry Secondary Dressing: (BORDER) Zetuvit Plus SILICONE BORDER Dressing 5x5 (in/in) (Generic) 1 x Per Day/30 Days Discharge  Instructions: Please do not put silicone bordered dressings under wraps. Use non-bordered dressing only. Secured With: Medipore T - 41M Medipore H Soft Cloth Surgical T ape ape, 2x2 (in/yd) (Dispense As Written) 1 x Per Day/30 Days Wound #12 - Ankle Wound Laterality: Right, Medial Cleanser: Dakin 16 (oz) 0.25 (Generic) 1 x Per Day/30 Days Discharge Instructions: Use as directed. Cleanser: Soap and Water 1 x Per Day/30 Days Discharge Instructions: Gently cleanse wound with antibacterial soap, rinse and pat dry prior to dressing wounds Prim Dressing: Honey: Activon Honey Gel, 25 (g) Tube ary 1 x Per Day/30 Days Secondary Dressing: Hydrofera Blue Ready Transfer Foam, 2.5x2.5 (in/in) 1 x Per Day/30 Days Discharge Instructions: Apply to wound bed over non-stick dressing. Secondary Dressing: (BORDER) Zetuvit Plus SILICONE BORDER Dressing 5x5 (in/in) (Generic) 1 x Per Day/30 Days Discharge Instructions: Please do not put silicone bordered dressings under wraps. Use non-bordered dressing only. Secured With: Medipore T - 41M Medipore H Soft Cloth Surgical T ape ape, 2x2 (in/yd) (Dispense As Written) 1 x Per Day/30 Days Wound #13 - Calcaneus Wound Laterality: Left Cleanser: Dakin 16 (oz) 0.25 (Generic) 1 x Per Day/30 Days Discharge Instructions: Use as directed. Cleanser: Soap and Water 1 x Per Day/30 Days Discharge Instructions: Gently cleanse wound with antibacterial soap, rinse and pat dry prior to dressing wounds Prim Dressing: Honey: Activon Honey Gel, 25 (g) Tube ary 1 x Per Day/30 Days Secondary Dressing: Hydrofera Blue Ready Transfer Foam, 2.5x2.5 (in/in) 1 x Per Day/30 Days Discharge Instructions: Apply to wound bed over non-stick dressing. Secondary Dressing: (BORDER) Zetuvit Plus SILICONE BORDER Dressing 5x5 (in/in) (Generic) 1 x Per Day/30 Days Discharge Instructions: Please do not put silicone bordered dressings under wraps. Use non-bordered dressing only. Secured With: Medipore T - 41M  Medipore H Soft Cloth Surgical T ape ape, 2x2 (in/yd) (Dispense As Written) 1 x Per Day/30 Days DUSTINE, LAPORTA ([id number]) [id number] Page 5 of 11 Wound #6 - Calcaneus Wound Laterality: Right Cleanser: [place] Ancillary Kit - 15 Day Supply (Generic) 3 x Per Week/30 Days Discharge Instructions: Use supplies as instructed; Kit contains: (15) Saline Bullets; (15) 3x3 Gauze; 15 pr Gloves Cleanser: Dakin 16 (oz) 0.25 (Dispense As Written) 3 x Per Week/30 Days Discharge Instructions: Use as directed. Cleanser: Soap and Water 3 x Per Week/30 Days Discharge Instructions: Gently cleanse wound with antibacterial soap, rinse and pat dry prior to dressing wounds Topical: Activon Honey Gel, 25 (g) Tube (Dispense As Written) 3 x Per Week/30 Days Prim Dressing: Hydrofera Blue Ready Transfer Foam, 2.5x2.5 (in/in) 3 x Per Week/30 Days ary Discharge Instructions: Apply Hydrofera Blue Ready to wound bed as directed Secondary Dressing: ABD Pad 5x9 (in/in) 3 x Per Week/30 Days Discharge Instructions: Cover with ABD pad Secondary Dressing: Kerlix 4.5 x 4.1 (in/yd) 3 x Per Week/30 Days Discharge Instructions: Apply Kerlix 4.5 x 4.1 (in/yd) as instructed Secured With: Medipore T - 41M Medipore H Soft Cloth Surgical T ape ape, 2x2 (in/yd) 3 x Per Week/30 Days Wound #8 - Sacrum Wound Laterality: Midline Cleanser: Dakin 16 (oz) 0.25 (  Generic) 1 x Per Day/30 Days Discharge Instructions: Use as directed. Cleanser: Soap and Water 1 x Per Day/30 Days Discharge Instructions: Gently cleanse wound with antibacterial soap, rinse and pat dry prior to dressing wounds Prim Dressing: Gauze 1 x Per Day/30 Days ary Discharge Instructions: moistened with Dakins Solution for wet to dry dressing Secondary Dressing: ABD Pad 5x9 (in/in) 1 x Per Day/30 Days Discharge Instructions: Cover with ABD pad Secured With: Medipore T - 38M Medipore H Soft Cloth Surgical T ape ape, 2x2 (in/yd) 1 x Per Day/30  Days Electronic Signature(s) Signed: 01/02/2023 4:39:06 PM By: Kalman Shan DO Entered By: Kalman Shan on 01/02/2023 14:55:53 -------------------------------------------------------------------------------- Problem List Details Patient Name: Date of Service: [facility], [name]. 01/02/2023 1:00 PM Medical Record Number: [id number] Patient Account Number: [id number] Date of Birth/Sex: Treating RN: 03-25-1941 (82 y.o. [name] Primary Care Provider: Tracie Harrier Other Clinician: Massie Kluver Referring Provider: Treating Provider/Extender: Arn Medal Weeks in Treatment: 60 Active Problems ICD-10 Encounter Code Description Active Date MDM Diagnosis [phone] Non-pressure chronic ulcer of other part of left lower leg with fat layer 09/26/2022 No Yes exposed Zapata, Tilly [name] ([id number]) [id number] Page 6 of 11 I87.312 Chronic venous hypertension (idiopathic) with ulcer of left lower extremity 09/26/2022 No Yes I89.0 Lymphedema, not elsewhere classified 09/26/2022 No Yes E11.622 Type 2 diabetes mellitus with other skin ulcer 09/26/2022 No Yes [id number] Chronic diastolic (congestive) heart failure 09/26/2022 No Yes L89.320 Pressure ulcer of left buttock, unstageable 09/26/2022 No Yes L89.610 Pressure ulcer of right heel, unstageable 09/26/2022 No Yes S91.302A Unspecified open wound, left foot, initial encounter 10/24/2022 No Yes M86.172 Other acute osteomyelitis, left ankle and foot 10/24/2022 No Yes L89.210 Pressure ulcer of right hip, unstageable 11/21/2022 No Yes L89.513 Pressure ulcer of right ankle, stage 3 12/05/2022 No Yes L89.620 Pressure ulcer of left heel, unstageable 12/05/2022 No Yes Inactive Problems Resolved Problems Electronic Signature(s) Signed: 01/02/2023 4:39:06 PM By: Kalman Shan DO Entered By: Kalman Shan on 01/02/2023  14:32:15 -------------------------------------------------------------------------------- Progress Note Details Patient Name: Date of Service: [name], [name]. 01/02/2023 1:00 PM Medical Record Number: [id number] Patient Account Number: [id number] Date of Birth/Sex: Treating RN: Dec 22, 1940 (82 y.o. [name] Primary Care Provider: Tracie Harrier Other Clinician: Massie Kluver Referring Provider: Treating Provider/Extender: Arn Medal Henefer, [name] ([id number]) [id number] Page 7 of 11 Weeks in Treatment: 14 Subjective Chief Complaint Information obtained from Patient 09/26/2022; left lower extremity wound, right heel wound and left buttocks wound History of Present Illness (HPI) 82 year old patient who comes with a referral for bilateral lower extremity edema and a lower extremity ulceration and has been sent by her PCP Dr. Placido Sou. I understand the patient was recently put on amoxicillin and doxycycline but could not tolerate the amoxicillin. doxycycline course was completed. a BNP and EKG was supposed to be normal and the patient did not have any dyspnea. the patient has been on a diuretic. The patient was also prescribed a pair of elastic compression stockings of the 20-30 mmHg pressure variety. x-ray of the right ankle was done on 09/20/2015 and showed posttraumatic and postsurgical changes of the right ankle with secondary degenerative changes of the tibiotalar joint and to a lesser degree the subtalar joint. No definite acute bony abnormalities are noted. Past medical history significant for diabetes mellitus, hypertension, hyperlipidemia, right breast cancer treated with a mastectomy in 2014. She has never smoked. 10/24/2015 -- she had delayed her vascular test because of her husband surgery  but she is now ready to get him taken care of. He is also unable to use compression stockings and hence we will need to  order her Juzo wraps. 10/31/2015-- was seen by Dr. Lucky Cowboy on 10/28/2015. She had a left lower extremity arterial duplex done at his office a couple of years ago and that was essentially normal. Today they performed a venous duplex which revealed no evidence of deep vein thrombosis, superficial thrombophlebitis, no venous reflex was seen on the right and a minimal amount of reflux was seen on the left great saphenous vein but no significant reflux was seen. Impression was that there was a component of lymphedema present from a previous surgery and he would recommend compression stockings and leg elevation. Readmission: 07-24-2022 upon evaluation today patient presents for initial inspection here in our clinic concerning issues that she has been having with her legs this is actually been going on for several years according to what her family member with her today tells me as well as what the patient reiterates as well. She is currently most recently been seeing Dr. Vickki Muff and subsequently he had her in [employer] boots. However 2 weeks ago he referred her to [place] and then subsequently took her out of the [place] boot wraps at that point. At this time the left leg looks to be worse in the right leg currently. She is on Lasix and lisinopril with hydrochlorothiazide she has high blood pressure she also has issues currently with lower extremity swelling and edema which has been an ongoing issue for her as well. Patient does have a history of chronic venous hypertension, lymphedema, diabetes mellitus type 2, hypertension, peripheral vascular disease, and neuropathy. Currently she is on Lasix as well as lisinopril with hydrochlorothiazide. 08-02-2022 upon evaluation today patient presents for reevaluation the good news is she is actually doing somewhat better in regard to the wound and the overall appearance and sinuses. The unfortunate thing is her infection really is not significantly improved we did have to switch out  her antibiotic once we got that final result back and I switched her to Levaquin and away from the doxycycline. Unfortunately the doxycycline had been doing poorly for her. In fact she had had diarrhea from the time she started taking it on Friday and she is still having it when she shows up today for evaluation. Again I was not aware of this and obviously she does appear to be somewhat dehydrated as well based on what I see. My concern which I discussed with the patient today is the possibility of a C. difficile infection. With that being said fact this started immediately upon taking the doxycycline makes me think that it was just the medicine and is not completely out of her system yet despite having taken the last dose Tuesday morning. Nonetheless with what we are seeing currently I want to be sure that reason I Minna contact her primary care provider and see if a would be willing to see her and test for C. difficile infection. 08-07-2022 upon evaluation today patient appears to be doing well currently in regard to her wounds which are actually measuring much better this is great news. Fortunately I do not see any signs of active infection locally or systemically at this time which is great as well. The good news is she was tested for a C. difficile infection and it was negative. I am very pleased and thankful for primary care provider for doing that so this means that she  was just having a severe reaction to the doxycycline we have added that to her allergy list at this point. 08-14-2022 upon evaluation today patient appears to be doing well currently in regard to her dehydration she is actually significantly improved compared to last time I saw her last week. With that being said I do not see any evidence of active infection systemically at this time which is great news. However locally she still does appear to have cellulitis in left lower extremity. I discussed with her today that I do believe she  would benefit from going ahead and starting the [place]. Again we will concerned about the C. difficile infection of the diarrhea but that this turned out to be just an issue with a reaction to the doxycycline. My hope is that the Levaquin will not cause her any complication and will be able to treat the infection. I did review her arterial study as well which was dated on 07-31-2022. It showed that she had a ABI on the right of 1.30 and on the left of 1.27 with a TBI on the right of 1.12 and the left 1.21 this is a normal arterial study. Again on the patient's wound culture she actually did show evidence of Proteus, Morganella, and MRSA. Levaquin is a good option here across the board. 09/26/2022 [name] is a 82 year old female with a past medical history of type 2 diabetes currently controlled on oral agents, venous insufficiency/lymphedema, right breast cancer and chronic diastolic heart failure that presents to the clinic for a 1 month history of nonhealing wounds to the left lower extremity, right heel and left buttocks. She states that the buttocks wound and the right heel wound developed while she was in the [name]. She was admitted on 08/22/2022 for severe sepsis secondary to acute sigmoid and distal colonic diverticulitis. At that time it was noted she had a stage I decubitus ulcer to her bilateral buttocks. A wound to the heel was not mentioned. She currently denies systemic signs of infection. She came into clinic in a blue gown with no undergarments. She has not been dressing the wounds. She states she just recently obtained home health and they are coming out for the first time this week. She is seen in our clinic often for lower extremity wounds secondary to venous insufficiency. 11/22; patient presents for follow-up. Son is present during the encounter. Per son it sounds like they are not doing any dressing changes. She states she has home health but they have not come  out. She is going to let us know which home health agency she has been approved for so we can send orders. Currently she denies signs of infection. 12/13; patient presents for follow-up. Patient has home health and they are coming out once a week. It appears that there has not been any dressing changes except for with home health. Patient has a newly discovered wound to the left foot. There is exposed bone. There is slight erythema and increased warmth to the surrounding tissue. Patient is completely unaware of this wound. 12/20; patient presents for follow-up. She has been taking her oral antibiotics. She now has an eschar and wound to the right hip. She has been using Dakin's wet-to-dry dressings to the left foot wound and buttocks wound. She has been using Medihoney and silver alginate to the right heel wound. She currently denies systemic signs of infection. She is mainly bedbound or in a wheelchair. 1/10; patient presents for follow-up. Patient had her  left second toenail removed by Dr. Vickki Muff, podiatry on 12/21. Unfortunately she did not feel well and she was admitted to the [name] for sepsis. Her right heel wound was thought to be infected and this was debrided in the OR by Dr. Vickki Muff. Culture and bone biopsy had no growth. She has been using Medihoney to all the wound beds except for the lefty buttocks wound for which she uses Dakin's wet-to-dry dressings. She has developed a pressure injury to the left heel now. This is despite having Prevalon boots. Although she is not wearing them today. She has completed 4 weeks of Augmentin and also a week of doxycycline for osteomyelitis of the left foot. Her left foot wound no longer probes to bone. Currently she denies signs of infection. [name], [name] ([id number]) [id number] Page 8 of 11 1/24; patient presents for follow-up. She has been using Medihoney and Hydrofera Blue to the wound beds except for this buttocks  wound she has been using Dakin's wet-to-dry dressings. She has developed 2 new wounds 1 to the left heel and the other to the right medial ankle. She claims she is wearing her Prevalon boots all the time although she does not have them on today. She currently denies signs of infection. 2/21; patient presents for follow-up. She was recently hospitalized on 12/19/2022 for sepsis secondary to Enterococcus bacillus bacteremia. She was treated with IV antibiotics and has completed her discharge oral antibiotics. She had a CT scanning of the abdomen/pelvis without acute abnormalities. Husband is present with patient today. They have been using Dakin's wet-to-dry dressings to the sacral wound and hip wound and Hydrofera Blue and Medihoney to the feet wounds. They are not changing the dressings daily. It is unclear how often they actually change the dressings. She has been wearing her Prevalon boots at night but not during the day. She currently denies systemic signs of infection. Objective Constitutional Vitals Time Taken: 1:02 PM, Weight: 116 lbs, Temperature: 98.2 F, Pulse: 81 bpm, Respiratory Rate: 16 breaths/min, Blood Pressure: 126/67 mmHg. General Notes: T the right heel there is an open wound with nonviable tissue and mostly granulation tissue. T the medial malleolus there is an open wound with o o granulation tissue and tightly adhered nonviable tissue. T the left buttocks there is a large open wound with granulation tissue. T the left foot first metatarsal o o there is an open wound with granulation tissue. T the left heel there is an open wound with eschar throughout. T the right hip there is a wound with non viable o o throughout, Now with pockets of increased depth. No signs of overt acute infection. Integumentary (Hair, Skin) Wound #10 status is Open. Original cause of wound was Pressure Injury. The date acquired was: 10/23/2022. The wound has been in treatment 10 weeks. The wound is  located on the Left Metatarsal head first. The wound measures 1.3cm length x 1.5cm width x 0.1cm depth; 1.532cm^2 area and 0.153cm^3 volume. There is bone and Fat Layer (Subcutaneous Tissue) exposed. There is a large amount of serosanguineous drainage noted. The wound margin is flat and intact. There is no granulation within the wound bed. There is a medium (34-66%) amount of necrotic tissue within the wound bed including Necrosis of Bone. Wound #11 status is Open. Original cause of wound was Pressure Injury. The date acquired was: 10/24/2022. The wound has been in treatment 9 weeks. The wound is located on the Right Ischial Tuberosity. The wound measures 3.5cm length x 9.5cm width x  0.9cm depth; 26.114cm^2 area and 23.503cm^3 volume. There is Fat Layer (Subcutaneous Tissue) exposed. There is undermining starting at 12:00 and ending at 12:00 with a maximum distance of 2.6cm. There is a none present amount of drainage noted. The wound margin is flat and intact. There is a medium (34-66%) amount of necrotic tissue within the wound bed including Eschar. Wound #12 status is Open. Original cause of wound was Pressure Injury. The date acquired was: 12/05/2022. The wound has been in treatment 4 weeks. The wound is located on the Right,Medial Ankle. The wound measures 3cm length x 2.5cm width x 0.1cm depth; 5.89cm^2 area and 0.589cm^3 volume. There is Fat Layer (Subcutaneous Tissue) exposed. There is a medium amount of serosanguineous drainage noted. The wound margin is flat and intact. Wound #13 status is Open. Original cause of wound was Pressure Injury. The date acquired was: 11/28/2022. The wound has been in treatment 4 weeks. The wound is located on the Left Calcaneus. The wound measures 2.5cm length x 2.5cm width x 0.1cm depth; 4.909cm^2 area and 0.491cm^3 volume. There is Fat Layer (Subcutaneous Tissue) exposed. There is a medium amount of serosanguineous drainage noted. The wound margin is flat and  intact. There is no granulation within the wound bed. There is a large (67-100%) amount of necrotic tissue within the wound bed including Eschar. Wound #6 status is Open. Original cause of wound was Pressure Injury. The date acquired was: 07/23/2022. The wound has been in treatment 14 weeks. The wound is located on the Right Calcaneus. The wound measures 2cm length x 1.6cm width x 0.1cm depth; 2.513cm^2 area and 0.251cm^3 volume. There is Fat Layer (Subcutaneous Tissue) exposed. There is a medium amount of serosanguineous drainage noted. Foul odor after cleansing was noted. There is no granulation within the wound bed. There is a large (67-100%) amount of necrotic tissue within the wound bed including Eschar. Wound #8 status is Open. Original cause of wound was Pressure Injury. The date acquired was: 07/23/2022. The wound has been in treatment 14 weeks. The wound is located on the Midline Sacrum. The wound measures 2.9cm length x 2.5cm width x 1.2cm depth; 5.694cm^2 area and 6.833cm^3 volume. There is Fat Layer (Subcutaneous Tissue) exposed. There is a medium amount of serosanguineous drainage noted. Foul odor after cleansing was noted. There is no granulation within the wound bed. There is a large (67-100%) amount of necrotic tissue within the wound bed including Adherent Slough. Assessment Active Problems ICD-10 Non-pressure chronic ulcer of other part of left lower leg with fat layer exposed Chronic venous hypertension (idiopathic) with ulcer of left lower extremity Lymphedema, not elsewhere classified Type 2 diabetes mellitus with other skin ulcer Chronic diastolic (congestive) heart failure Pressure ulcer of left buttock, unstageable Pressure ulcer of right heel, unstageable Unspecified open wound, left foot, initial encounter Other acute osteomyelitis, left ankle and foot Pressure ulcer of right hip, unstageable Pressure ulcer of right ankle, stage 3 Pressure ulcer of left heel,  unstageable Patient's wounds are overall stable with the exception of the right hip wound that has declined in appearance. No signs of obvious acute infection. She was recently hospitalized for sepsis secondary to bacteremia. She had a CT scan of the pelvis at that time that showed no acute abnormalities. We had a long discussion about the importance of aggressive offloading, protein intake for wound healing. Unfortunately patient is not mobile due to weakness. She is [name], [name] ([id number]) [id number] Page 9 of 11 bedbound or wheelchair-bound throughout the day. I  recommended she wear the Prevalon boots at all times. We also discussed how these wounds will likely not heal. She has been hospitalized 3 times in the past 4 months for sepsis with bacteremia. I recommended she talk with her PCP about palliative care. I will see her monthly. She knows to call with any questions or concerns. She knows to go to the ED if she were to develop symptoms of infection. Plan Follow-up Appointments: Return Appointment in 1 week. Home Health: [name] for wound care. May utilize formulary equivalent dressing for wound treatment orders unless otherwise specified. Home Health Nurse may visit PRN to address patientoos wound care needs. Alvis Lemmings [phone] Scheduled days for dressing changes to be completed; exception, patient has scheduled wound care visit that day. - Home health to change once weekly and son will change dressing once weekly, patient to be seen on Wednesday's in office Bathing/ Shower/ Hygiene: Wash wounds with antibacterial soap and water. No tub bath. Anesthetic (Use 'Patient Medications' Section for Anesthetic Order Entry): Lidocaine applied to wound bed Off-Loading: Other: - wear prevalon heel boots at all times WOUND #10: - Metatarsal head first Wound Laterality: Left Cleanser: Dakin 16 (oz) 0.25 (Generic) 3 x Per Week/30 Days Discharge  Instructions: Use as directed. Cleanser: Soap and Water 3 x Per Week/30 Days Discharge Instructions: Gently cleanse wound with antibacterial soap, rinse and pat dry prior to dressing wounds Prim Dressing: Honey: Activon Honey Gel, 25 (g) Tube 3 x Per Week/30 Days ary Prim Dressing: Hydrofera Blue Ready Transfer Foam, 2.5x2.5 (in/in) (Dispense As Written) 3 x Per Week/30 Days ary Discharge Instructions: Apply Hydrofera Blue Ready to wound bed as directed Secondary Dressing: ABD Pad 5x9 (in/in) 3 x Per Week/30 Days Discharge Instructions: Cover with ABD pad Secured With: Medipore T - 21M Medipore H Soft Cloth Surgical T ape ape, 2x2 (in/yd) 3 x Per Week/30 Days WOUND #11: - Ischial Tuberosity Wound Laterality: Right Cleanser: Dakin 16 (oz) 0.25 (Generic) 1 x Per Day/30 Days Discharge Instructions: Use as directed. Cleanser: Soap and Water 1 x Per Day/30 Days Discharge Instructions: Gently cleanse wound with antibacterial soap, rinse and pat dry prior to dressing wounds Prim Dressing: Gauze 1 x Per Day/30 Days ary Discharge Instructions: dakins wet to dry Secondary Dressing: (BORDER) Zetuvit Plus SILICONE BORDER Dressing 5x5 (in/in) (Generic) 1 x Per Day/30 Days Discharge Instructions: Please do not put silicone bordered dressings under wraps. Use non-bordered dressing only. Secured With: Medipore T - 21M Medipore H Soft Cloth Surgical T ape ape, 2x2 (in/yd) (Dispense As Written) 1 x Per Day/30 Days WOUND #12: - Ankle Wound Laterality: Right, Medial Cleanser: Dakin 16 (oz) 0.25 (Generic) 1 x Per Day/30 Days Discharge Instructions: Use as directed. Cleanser: Soap and Water 1 x Per Day/30 Days Discharge Instructions: Gently cleanse wound with antibacterial soap, rinse and pat dry prior to dressing wounds Prim Dressing: Honey: Activon Honey Gel, 25 (g) Tube 1 x Per Day/30 Days ary Secondary Dressing: Hydrofera Blue Ready Transfer Foam, 2.5x2.5 (in/in) 1 x Per Day/30 Days Discharge  Instructions: Apply to wound bed over non-stick dressing. Secondary Dressing: (BORDER) Zetuvit Plus SILICONE BORDER Dressing 5x5 (in/in) (Generic) 1 x Per Day/30 Days Discharge Instructions: Please do not put silicone bordered dressings under wraps. Use non-bordered dressing only. Secured With: Medipore T - 21M Medipore H Soft Cloth Surgical T ape ape, 2x2 (in/yd) (Dispense As Written) 1 x Per Day/30 Days WOUND #13: - Calcaneus Wound Laterality: Left Cleanser: Dakin 16 (oz) 0.25 (Generic)  1 x Per Day/30 Days Discharge Instructions: Use as directed. Cleanser: Soap and Water 1 x Per Day/30 Days Discharge Instructions: Gently cleanse wound with antibacterial soap, rinse and pat dry prior to dressing wounds Prim Dressing: Honey: Activon Honey Gel, 25 (g) Tube 1 x Per Day/30 Days ary Secondary Dressing: Hydrofera Blue Ready Transfer Foam, 2.5x2.5 (in/in) 1 x Per Day/30 Days Discharge Instructions: Apply to wound bed over non-stick dressing. Secondary Dressing: (BORDER) Zetuvit Plus SILICONE BORDER Dressing 5x5 (in/in) (Generic) 1 x Per Day/30 Days Discharge Instructions: Please do not put silicone bordered dressings under wraps. Use non-bordered dressing only. Secured With: Medipore T - 26M Medipore H Soft Cloth Surgical T ape ape, 2x2 (in/yd) (Dispense As Written) 1 x Per Day/30 Days WOUND #6: - Calcaneus Wound Laterality: Right Cleanser: [place]ram Ancillary Kit - 15 Day Supply (Generic) 3 x Per Week/30 Days Discharge Instructions: Use supplies as instructed; Kit contains: (15) Saline Bullets; (15) 3x3 Gauze; 15 pr Gloves Cleanser: Dakin 16 (oz) 0.25 (Dispense As Written) 3 x Per Week/30 Days Discharge Instructions: Use as directed. Cleanser: Soap and Water 3 x Per Week/30 Days Discharge Instructions: Gently cleanse wound with antibacterial soap, rinse and pat dry prior to dressing wounds Topical: Activon Honey Gel, 25 (g) Tube (Dispense As Written) 3 x Per Week/30 Days Prim Dressing: Hydrofera Blue  Ready Transfer Foam, 2.5x2.5 (in/in) 3 x Per Week/30 Days ary Discharge Instructions: Apply Hydrofera Blue Ready to wound bed as directed Secondary Dressing: ABD Pad 5x9 (in/in) 3 x Per Week/30 Days Discharge Instructions: Cover with ABD pad Secondary Dressing: Kerlix 4.5 x 4.1 (in/yd) 3 x Per Week/30 Days Discharge Instructions: Apply Kerlix 4.5 x 4.1 (in/yd) as instructed Secured With: Medipore T - 26M Medipore H Soft Cloth Surgical T ape ape, 2x2 (in/yd) 3 x Per Week/30 Days WOUND #8: - Sacrum Wound Laterality: Midline Cleanser: Dakin 16 (oz) 0.25 (Generic) 1 x Per Day/30 Days Discharge Instructions: Use as directed. Cleanser: Soap and Water 1 x Per Day/30 Days Discharge Instructions: Gently cleanse wound with antibacterial soap, rinse and pat dry prior to dressing wounds Prim Dressing: Gauze 1 x Per Day/30 Days ary Discharge Instructions: moistened with Dakins Solution for wet to dry dressing Secondary Dressing: ABD Pad 5x9 (in/in) 1 x Per Day/30 Days Discharge Instructions: Cover with ABD pad Secured With: Medipore T - 26M Medipore H Soft Cloth Surgical T ape ape, 2x2 (in/yd) 1 x Per Day/30 Days [name], [name] ([id number]) [id number] Page 10 of 11 1. Dakin's wet-to-dry dressings 2. Medihoney and Hydrofera Blue 3. Aggressive offloadingooreposition every 1-2 hours, Prevalon boots 4. Follow-up in 4 weeks Electronic Signature(s) Signed: 01/02/2023 4:39:06 PM By: Kalman Shan DO Entered By: Kalman Shan on 01/02/2023 14:51:09 -------------------------------------------------------------------------------- SuperBill Details Patient Name: Date of Service: Viera [name], [name]. 01/02/2023 Medical Record Number: [id number] Patient Account Number: [id number] Date of Birth/Sex: Treating RN: Oct 13, 1941 (82 y.o. Charolette Forward, Kim Primary Care Provider: Tracie Harrier Other Clinician: Massie Kluver Referring Provider: Treating Provider/Extender:  Arn Medal Weeks in Treatment: 14 Diagnosis Coding ICD-10 Codes Code Description [phone] Non-pressure chronic ulcer of other part of left lower leg with fat layer exposed I87.312 Chronic venous hypertension (idiopathic) with ulcer of left lower extremity I89.0 Lymphedema, not elsewhere classified E11.622 Type 2 diabetes mellitus with other skin ulcer [id number] Chronic diastolic (congestive) heart failure L89.320 Pressure ulcer of left buttock, unstageable L89.610 Pressure ulcer of right heel, unstageable S91.302A Unspecified open wound, left foot, initial encounter M86.172 Other acute osteomyelitis, left ankle  and foot L89.210 Pressure ulcer of right hip, unstageable L89.513 Pressure ulcer of right ankle, stage 3 L89.620 Pressure ulcer of left heel, unstageable Facility Procedures : CPT4 Code: [id number] Description: [id number] - WOUND CARE VISIT-LEV 5 EST PT Modifier: Quantity: 1 Physician Procedures : CPT4 Code Description Modifier [id number] - WC PHYS LEVEL 3 - EST PT ICD-10 Diagnosis Description L89.210 Pressure ulcer of right hip, unstageable L89.513 Pressure ulcer of right ankle, stage 3 L89.620 Pressure ulcer of left heel, unstageable  L89.320 Pressure ulcer of left buttock, unstageable Quantity: 1 Electronic Signature(s) Signed: 01/02/2023 4:39:06 PM By: Kalman Shan DO Entered By: Kalman Shan on 01/02/2023 14:55:44 Fluegel, [name] ([id number]) [id number] Page 11 of 11

## 2023-01-04 DIAGNOSIS — N1330 Unspecified hydronephrosis: Secondary | ICD-10-CM | POA: Diagnosis not present

## 2023-01-04 DIAGNOSIS — I82422 Acute embolism and thrombosis of left iliac vein: Secondary | ICD-10-CM | POA: Diagnosis not present

## 2023-01-04 DIAGNOSIS — L89892 Pressure ulcer of other site, stage 2: Secondary | ICD-10-CM | POA: Diagnosis not present

## 2023-01-04 DIAGNOSIS — I5032 Chronic diastolic (congestive) heart failure: Secondary | ICD-10-CM | POA: Diagnosis not present

## 2023-01-04 DIAGNOSIS — L89213 Pressure ulcer of right hip, stage 3: Secondary | ICD-10-CM | POA: Diagnosis not present

## 2023-01-04 DIAGNOSIS — I700 Atherosclerosis of aorta: Secondary | ICD-10-CM | POA: Diagnosis not present

## 2023-01-04 DIAGNOSIS — L89154 Pressure ulcer of sacral region, stage 4: Secondary | ICD-10-CM | POA: Diagnosis not present

## 2023-01-04 DIAGNOSIS — E1151 Type 2 diabetes mellitus with diabetic peripheral angiopathy without gangrene: Secondary | ICD-10-CM | POA: Diagnosis not present

## 2023-01-04 DIAGNOSIS — M47812 Spondylosis without myelopathy or radiculopathy, cervical region: Secondary | ICD-10-CM | POA: Diagnosis not present

## 2023-01-04 DIAGNOSIS — G9341 Metabolic encephalopathy: Secondary | ICD-10-CM | POA: Diagnosis not present

## 2023-01-04 DIAGNOSIS — M1A9XX0 Chronic gout, unspecified, without tophus (tophi): Secondary | ICD-10-CM | POA: Diagnosis not present

## 2023-01-04 DIAGNOSIS — E1140 Type 2 diabetes mellitus with diabetic neuropathy, unspecified: Secondary | ICD-10-CM | POA: Diagnosis not present

## 2023-01-04 DIAGNOSIS — I110 Hypertensive heart disease with heart failure: Secondary | ICD-10-CM | POA: Diagnosis not present

## 2023-01-04 DIAGNOSIS — E039 Hypothyroidism, unspecified: Secondary | ICD-10-CM | POA: Diagnosis not present

## 2023-01-04 DIAGNOSIS — M1712 Unilateral primary osteoarthritis, left knee: Secondary | ICD-10-CM | POA: Diagnosis not present

## 2023-01-04 DIAGNOSIS — M5410 Radiculopathy, site unspecified: Secondary | ICD-10-CM | POA: Diagnosis not present

## 2023-01-04 DIAGNOSIS — E7800 Pure hypercholesterolemia, unspecified: Secondary | ICD-10-CM | POA: Diagnosis not present

## 2023-01-04 DIAGNOSIS — G8929 Other chronic pain: Secondary | ICD-10-CM | POA: Diagnosis not present

## 2023-01-04 DIAGNOSIS — I890 Lymphedema, not elsewhere classified: Secondary | ICD-10-CM | POA: Diagnosis not present

## 2023-01-04 DIAGNOSIS — M792 Neuralgia and neuritis, unspecified: Secondary | ICD-10-CM | POA: Diagnosis not present

## 2023-01-04 DIAGNOSIS — I82412 Acute embolism and thrombosis of left femoral vein: Secondary | ICD-10-CM | POA: Diagnosis not present

## 2023-01-04 DIAGNOSIS — I70201 Unspecified atherosclerosis of native arteries of extremities, right leg: Secondary | ICD-10-CM | POA: Diagnosis not present

## 2023-01-04 DIAGNOSIS — M8580 Other specified disorders of bone density and structure, unspecified site: Secondary | ICD-10-CM | POA: Diagnosis not present

## 2023-01-04 DIAGNOSIS — L89613 Pressure ulcer of right heel, stage 3: Secondary | ICD-10-CM | POA: Diagnosis not present

## 2023-01-05 ENCOUNTER — Other Ambulatory Visit: Payer: Self-pay

## 2023-01-05 ENCOUNTER — Inpatient Hospital Stay
Admission: EM | Admit: 2023-01-05 | Discharge: 2023-01-15 | DRG: 853 | Disposition: A | Discharge status: Home Health | Payer: PPO | Attending: Internal Medicine | Admitting: Internal Medicine

## 2023-01-05 ENCOUNTER — Inpatient Hospital Stay: Payer: PPO

## 2023-01-05 DIAGNOSIS — Z8614 Personal history of Methicillin resistant Staphylococcus aureus infection: Secondary | ICD-10-CM

## 2023-01-05 DIAGNOSIS — L8990 Pressure ulcer of unspecified site, unspecified stage: Secondary | ICD-10-CM | POA: Diagnosis present

## 2023-01-05 DIAGNOSIS — E039 Hypothyroidism, unspecified: Secondary | ICD-10-CM | POA: Diagnosis present

## 2023-01-05 DIAGNOSIS — M86172 Other acute osteomyelitis, left ankle and foot: Secondary | ICD-10-CM | POA: Diagnosis present

## 2023-01-05 DIAGNOSIS — Z7984 Long term (current) use of oral hypoglycemic drugs: Secondary | ICD-10-CM

## 2023-01-05 DIAGNOSIS — R531 Weakness: Secondary | ICD-10-CM | POA: Diagnosis not present

## 2023-01-05 DIAGNOSIS — M792 Neuralgia and neuritis, unspecified: Secondary | ICD-10-CM | POA: Diagnosis not present

## 2023-01-05 DIAGNOSIS — M1A9XX0 Chronic gout, unspecified, without tophus (tophi): Secondary | ICD-10-CM | POA: Diagnosis present

## 2023-01-05 DIAGNOSIS — I890 Lymphedema, not elsewhere classified: Secondary | ICD-10-CM

## 2023-01-05 DIAGNOSIS — Z881 Allergy status to other antibiotic agents status: Secondary | ICD-10-CM

## 2023-01-05 DIAGNOSIS — Z9011 Acquired absence of right breast and nipple: Secondary | ICD-10-CM

## 2023-01-05 DIAGNOSIS — L97519 Non-pressure chronic ulcer of other part of right foot with unspecified severity: Secondary | ICD-10-CM | POA: Diagnosis present

## 2023-01-05 DIAGNOSIS — L089 Local infection of the skin and subcutaneous tissue, unspecified: Principal | ICD-10-CM | POA: Diagnosis present

## 2023-01-05 DIAGNOSIS — Z882 Allergy status to sulfonamides status: Secondary | ICD-10-CM

## 2023-01-05 DIAGNOSIS — G9341 Metabolic encephalopathy: Secondary | ICD-10-CM | POA: Diagnosis not present

## 2023-01-05 DIAGNOSIS — I130 Hypertensive heart and chronic kidney disease with heart failure and stage 1 through stage 4 chronic kidney disease, or unspecified chronic kidney disease: Secondary | ICD-10-CM | POA: Diagnosis present

## 2023-01-05 DIAGNOSIS — Z66 Do not resuscitate: Secondary | ICD-10-CM | POA: Diagnosis not present

## 2023-01-05 DIAGNOSIS — L89610 Pressure ulcer of right heel, unstageable: Secondary | ICD-10-CM | POA: Diagnosis present

## 2023-01-05 DIAGNOSIS — L89213 Pressure ulcer of right hip, stage 3: Secondary | ICD-10-CM | POA: Diagnosis present

## 2023-01-05 DIAGNOSIS — R54 Age-related physical debility: Secondary | ICD-10-CM | POA: Diagnosis present

## 2023-01-05 DIAGNOSIS — T148XXA Other injury of unspecified body region, initial encounter: Secondary | ICD-10-CM | POA: Diagnosis not present

## 2023-01-05 DIAGNOSIS — I87312 Chronic venous hypertension (idiopathic) with ulcer of left lower extremity: Secondary | ICD-10-CM | POA: Diagnosis present

## 2023-01-05 DIAGNOSIS — Z884 Allergy status to anesthetic agent status: Secondary | ICD-10-CM

## 2023-01-05 DIAGNOSIS — L97822 Non-pressure chronic ulcer of other part of left lower leg with fat layer exposed: Secondary | ICD-10-CM | POA: Diagnosis present

## 2023-01-05 DIAGNOSIS — I110 Hypertensive heart disease with heart failure: Secondary | ICD-10-CM | POA: Diagnosis not present

## 2023-01-05 DIAGNOSIS — L89320 Pressure ulcer of left buttock, unstageable: Secondary | ICD-10-CM | POA: Diagnosis present

## 2023-01-05 DIAGNOSIS — L97511 Non-pressure chronic ulcer of other part of right foot limited to breakdown of skin: Secondary | ICD-10-CM | POA: Diagnosis present

## 2023-01-05 DIAGNOSIS — L97522 Non-pressure chronic ulcer of other part of left foot with fat layer exposed: Secondary | ICD-10-CM | POA: Diagnosis present

## 2023-01-05 DIAGNOSIS — E785 Hyperlipidemia, unspecified: Secondary | ICD-10-CM | POA: Diagnosis present

## 2023-01-05 DIAGNOSIS — L97521 Non-pressure chronic ulcer of other part of left foot limited to breakdown of skin: Secondary | ICD-10-CM | POA: Diagnosis present

## 2023-01-05 DIAGNOSIS — Z90710 Acquired absence of both cervix and uterus: Secondary | ICD-10-CM

## 2023-01-05 DIAGNOSIS — Z79899 Other long term (current) drug therapy: Secondary | ICD-10-CM

## 2023-01-05 DIAGNOSIS — E1140 Type 2 diabetes mellitus with diabetic neuropathy, unspecified: Secondary | ICD-10-CM | POA: Diagnosis present

## 2023-01-05 DIAGNOSIS — I959 Hypotension, unspecified: Secondary | ICD-10-CM | POA: Diagnosis not present

## 2023-01-05 DIAGNOSIS — M1990 Unspecified osteoarthritis, unspecified site: Secondary | ICD-10-CM | POA: Diagnosis present

## 2023-01-05 DIAGNOSIS — D72829 Elevated white blood cell count, unspecified: Secondary | ICD-10-CM | POA: Diagnosis present

## 2023-01-05 DIAGNOSIS — L89153 Pressure ulcer of sacral region, stage 3: Secondary | ICD-10-CM | POA: Diagnosis present

## 2023-01-05 DIAGNOSIS — Z833 Family history of diabetes mellitus: Secondary | ICD-10-CM

## 2023-01-05 DIAGNOSIS — M869 Osteomyelitis, unspecified: Secondary | ICD-10-CM | POA: Diagnosis not present

## 2023-01-05 DIAGNOSIS — Z6823 Body mass index (BMI) 23.0-23.9, adult: Secondary | ICD-10-CM

## 2023-01-05 DIAGNOSIS — L89159 Pressure ulcer of sacral region, unspecified stage: Secondary | ICD-10-CM | POA: Diagnosis not present

## 2023-01-05 DIAGNOSIS — I5032 Chronic diastolic (congestive) heart failure: Secondary | ICD-10-CM | POA: Diagnosis present

## 2023-01-05 DIAGNOSIS — M5410 Radiculopathy, site unspecified: Secondary | ICD-10-CM | POA: Diagnosis not present

## 2023-01-05 DIAGNOSIS — R627 Adult failure to thrive: Secondary | ICD-10-CM | POA: Diagnosis not present

## 2023-01-05 DIAGNOSIS — D638 Anemia in other chronic diseases classified elsewhere: Secondary | ICD-10-CM | POA: Diagnosis present

## 2023-01-05 DIAGNOSIS — Z923 Personal history of irradiation: Secondary | ICD-10-CM

## 2023-01-05 DIAGNOSIS — Z79890 Hormone replacement therapy (postmenopausal): Secondary | ICD-10-CM

## 2023-01-05 DIAGNOSIS — C50911 Malignant neoplasm of unspecified site of right female breast: Secondary | ICD-10-CM | POA: Diagnosis present

## 2023-01-05 DIAGNOSIS — E8809 Other disorders of plasma-protein metabolism, not elsewhere classified: Secondary | ICD-10-CM | POA: Diagnosis present

## 2023-01-05 DIAGNOSIS — D509 Iron deficiency anemia, unspecified: Secondary | ICD-10-CM | POA: Diagnosis present

## 2023-01-05 DIAGNOSIS — R6520 Severe sepsis without septic shock: Secondary | ICD-10-CM | POA: Diagnosis present

## 2023-01-05 DIAGNOSIS — M4628 Osteomyelitis of vertebra, sacral and sacrococcygeal region: Secondary | ICD-10-CM | POA: Diagnosis present

## 2023-01-05 DIAGNOSIS — A419 Sepsis, unspecified organism: Secondary | ICD-10-CM | POA: Diagnosis present

## 2023-01-05 DIAGNOSIS — L89620 Pressure ulcer of left heel, unstageable: Secondary | ICD-10-CM | POA: Diagnosis present

## 2023-01-05 DIAGNOSIS — N1330 Unspecified hydronephrosis: Secondary | ICD-10-CM | POA: Diagnosis not present

## 2023-01-05 DIAGNOSIS — R262 Difficulty in walking, not elsewhere classified: Secondary | ICD-10-CM | POA: Diagnosis present

## 2023-01-05 DIAGNOSIS — L03116 Cellulitis of left lower limb: Secondary | ICD-10-CM | POA: Diagnosis present

## 2023-01-05 DIAGNOSIS — E11622 Type 2 diabetes mellitus with other skin ulcer: Secondary | ICD-10-CM | POA: Diagnosis present

## 2023-01-05 DIAGNOSIS — E1122 Type 2 diabetes mellitus with diabetic chronic kidney disease: Secondary | ICD-10-CM | POA: Diagnosis present

## 2023-01-05 DIAGNOSIS — N182 Chronic kidney disease, stage 2 (mild): Secondary | ICD-10-CM | POA: Diagnosis present

## 2023-01-05 DIAGNOSIS — Z515 Encounter for palliative care: Secondary | ICD-10-CM | POA: Diagnosis not present

## 2023-01-05 DIAGNOSIS — I700 Atherosclerosis of aorta: Secondary | ICD-10-CM | POA: Diagnosis not present

## 2023-01-05 DIAGNOSIS — E1169 Type 2 diabetes mellitus with other specified complication: Secondary | ICD-10-CM | POA: Diagnosis present

## 2023-01-05 DIAGNOSIS — R6510 Systemic inflammatory response syndrome (SIRS) of non-infectious origin without acute organ dysfunction: Secondary | ICD-10-CM | POA: Diagnosis not present

## 2023-01-05 DIAGNOSIS — I82422 Acute embolism and thrombosis of left iliac vein: Secondary | ICD-10-CM | POA: Diagnosis not present

## 2023-01-05 DIAGNOSIS — M1712 Unilateral primary osteoarthritis, left knee: Secondary | ICD-10-CM | POA: Diagnosis not present

## 2023-01-05 DIAGNOSIS — I10 Essential (primary) hypertension: Secondary | ICD-10-CM | POA: Diagnosis present

## 2023-01-05 DIAGNOSIS — E7800 Pure hypercholesterolemia, unspecified: Secondary | ICD-10-CM | POA: Diagnosis present

## 2023-01-05 DIAGNOSIS — I82412 Acute embolism and thrombosis of left femoral vein: Secondary | ICD-10-CM | POA: Diagnosis not present

## 2023-01-05 DIAGNOSIS — L89613 Pressure ulcer of right heel, stage 3: Secondary | ICD-10-CM | POA: Diagnosis not present

## 2023-01-05 DIAGNOSIS — M8580 Other specified disorders of bone density and structure, unspecified site: Secondary | ICD-10-CM | POA: Diagnosis not present

## 2023-01-05 DIAGNOSIS — E440 Moderate protein-calorie malnutrition: Secondary | ICD-10-CM | POA: Diagnosis present

## 2023-01-05 DIAGNOSIS — I70201 Unspecified atherosclerosis of native arteries of extremities, right leg: Secondary | ICD-10-CM | POA: Diagnosis not present

## 2023-01-05 DIAGNOSIS — Z853 Personal history of malignant neoplasm of breast: Secondary | ICD-10-CM

## 2023-01-05 DIAGNOSIS — Z86718 Personal history of other venous thrombosis and embolism: Secondary | ICD-10-CM

## 2023-01-05 DIAGNOSIS — Z87892 Personal history of anaphylaxis: Secondary | ICD-10-CM

## 2023-01-05 DIAGNOSIS — L89513 Pressure ulcer of right ankle, stage 3: Secondary | ICD-10-CM | POA: Diagnosis present

## 2023-01-05 DIAGNOSIS — L89154 Pressure ulcer of sacral region, stage 4: Secondary | ICD-10-CM | POA: Diagnosis not present

## 2023-01-05 DIAGNOSIS — I872 Venous insufficiency (chronic) (peripheral): Secondary | ICD-10-CM | POA: Diagnosis present

## 2023-01-05 DIAGNOSIS — G8929 Other chronic pain: Secondary | ICD-10-CM | POA: Diagnosis not present

## 2023-01-05 DIAGNOSIS — M47812 Spondylosis without myelopathy or radiculopathy, cervical region: Secondary | ICD-10-CM | POA: Diagnosis not present

## 2023-01-05 DIAGNOSIS — E43 Unspecified severe protein-calorie malnutrition: Secondary | ICD-10-CM | POA: Diagnosis present

## 2023-01-05 DIAGNOSIS — Z993 Dependence on wheelchair: Secondary | ICD-10-CM

## 2023-01-05 DIAGNOSIS — E1151 Type 2 diabetes mellitus with diabetic peripheral angiopathy without gangrene: Secondary | ICD-10-CM | POA: Diagnosis present

## 2023-01-05 DIAGNOSIS — L89892 Pressure ulcer of other site, stage 2: Secondary | ICD-10-CM | POA: Diagnosis not present

## 2023-01-05 DIAGNOSIS — Z7401 Bed confinement status: Secondary | ICD-10-CM

## 2023-01-05 DIAGNOSIS — I739 Peripheral vascular disease, unspecified: Secondary | ICD-10-CM | POA: Diagnosis present

## 2023-01-05 DIAGNOSIS — E119 Type 2 diabetes mellitus without complications: Secondary | ICD-10-CM

## 2023-01-05 LAB — CBC WITH DIFFERENTIAL/PLATELET
Abs Immature Granulocytes: 0.09 10*3/uL — ABNORMAL HIGH (ref 0.00–0.07)
Basophils Absolute: 0 10*3/uL (ref 0.0–0.1)
Basophils Relative: 0 %
Eosinophils Absolute: 0 10*3/uL (ref 0.0–0.5)
Eosinophils Relative: 0 %
HCT: 30.5 % — ABNORMAL LOW (ref 36.0–46.0)
Hemoglobin: 9.3 g/dL — ABNORMAL LOW (ref 12.0–15.0)
Immature Granulocytes: 1 %
Lymphocytes Relative: 9 %
Lymphs Abs: 1.1 10*3/uL (ref 0.7–4.0)
MCH: 24.7 pg — ABNORMAL LOW (ref 26.0–34.0)
MCHC: 30.5 g/dL (ref 30.0–36.0)
MCV: 81.1 fL (ref 80.0–100.0)
Monocytes Absolute: 0.5 10*3/uL (ref 0.1–1.0)
Monocytes Relative: 4 %
Neutro Abs: 10.1 10*3/uL — ABNORMAL HIGH (ref 1.7–7.7)
Neutrophils Relative %: 86 %
Platelets: 398 10*3/uL (ref 150–400)
RBC: 3.76 MIL/uL — ABNORMAL LOW (ref 3.87–5.11)
RDW: 18.7 % — ABNORMAL HIGH (ref 11.5–15.5)
WBC: 11.8 10*3/uL — ABNORMAL HIGH (ref 4.0–10.5)
nRBC: 0 % (ref 0.0–0.2)

## 2023-01-05 LAB — PROCALCITONIN: Procalcitonin: <0.1 ng/mL

## 2023-01-05 LAB — COMPREHENSIVE METABOLIC PANEL
ALT: 8 U/L (ref 0–44)
AST: 21 U/L (ref 15–41)
Albumin: 2.5 g/dL — ABNORMAL LOW (ref 3.5–5.0)
Alkaline Phosphatase: 50 U/L (ref 38–126)
Anion gap: 13 (ref 5–15)
BUN: 11 mg/dL (ref 8–23)
CO2: 20 mmol/L — ABNORMAL LOW (ref 22–32)
Calcium: 8.5 mg/dL — ABNORMAL LOW (ref 8.9–10.3)
Chloride: 103 mmol/L (ref 98–111)
Creatinine, Ser: 0.9 mg/dL (ref 0.44–1.00)
GFR, Estimated: >60 mL/min (ref >60)
Glucose, Bld: 89 mg/dL (ref 70–99)
Potassium: 3.5 mmol/L (ref 3.5–5.1)
Sodium: 136 mmol/L (ref 135–145)
Total Bilirubin: 0.6 mg/dL (ref 0.3–1.2)
Total Protein: 6.8 g/dL (ref 6.5–8.1)

## 2023-01-05 LAB — LACTIC ACID, PLASMA
Lactic Acid, Venous: 3.1 mmol/L (ref 0.5–1.9)
Lactic Acid, Venous: 2.3 mmol/L (ref 0.5–1.9)

## 2023-01-05 LAB — TROPONIN I (HIGH SENSITIVITY): Troponin I (High Sensitivity): 14 ng/L (ref <18)

## 2023-01-05 LAB — GLUCOSE, CAPILLARY: Glucose-Capillary: 93 mg/dL (ref 70–99)

## 2023-01-05 MED ORDER — SODIUM CHLORIDE 0.9 % IV BOLUS (SEPSIS)
1000.0000 mL | Freq: Once | INTRAVENOUS | Status: AC
  Administered 2023-01-05: 1000 mL via INTRAVENOUS

## 2023-01-05 MED ORDER — MORPHINE SULFATE (PF) 2 MG/ML IV SOLN: 1.0000 mg | INTRAVENOUS | Status: DC | PRN

## 2023-01-05 MED ORDER — SODIUM CHLORIDE 0.9 % IV SOLN
3.0000 g | Freq: Once | INTRAVENOUS | Status: AC
  Administered 2023-01-05: 3 g via INTRAVENOUS
  Filled 2023-01-05: qty 8

## 2023-01-05 MED ORDER — ACETAMINOPHEN 325 MG PO TABS: 650.0000 mg | ORAL_TABLET | Freq: Four times a day (QID) | ORAL | Status: DC | PRN

## 2023-01-05 MED ORDER — ONDANSETRON HCL 4 MG PO TABS: 4.0000 mg | ORAL_TABLET | Freq: Four times a day (QID) | ORAL | Status: DC | PRN

## 2023-01-05 MED ORDER — INSULIN ASPART 100 UNIT/ML IJ SOLN
0.0000 [IU] | Freq: Three times a day (TID) | INTRAMUSCULAR | Status: DC
  Administered 2023-01-06: 1 [IU] via SUBCUTANEOUS
  Filled 2023-01-05: qty 1

## 2023-01-05 MED ORDER — SODIUM CHLORIDE 0.9 % IV BOLUS (SEPSIS)
500.0000 mL | Freq: Once | INTRAVENOUS | Status: AC
  Administered 2023-01-05: 500 mL via INTRAVENOUS

## 2023-01-05 MED ORDER — SODIUM CHLORIDE 0.9 % IV SOLN
2.0000 g | Freq: Two times a day (BID) | INTRAVENOUS | Status: DC
  Administered 2023-01-05 – 2023-01-07 (×4): 2 g via INTRAVENOUS
  Filled 2023-01-05 (×5): qty 12.5

## 2023-01-05 MED ORDER — LEVOTHYROXINE SODIUM 88 MCG PO TABS
88.0000 ug | ORAL_TABLET | Freq: Every day | ORAL | Status: DC
  Administered 2023-01-06 – 2023-01-15 (×10): 88 ug via ORAL
  Filled 2023-01-05 (×10): qty 1

## 2023-01-05 MED ORDER — MORPHINE SULFATE (PF) 2 MG/ML IV SOLN
1.0000 mg | Freq: Once | INTRAVENOUS | Status: AC
  Administered 2023-01-05: 1 mg via INTRAVENOUS
  Filled 2023-01-05: qty 1

## 2023-01-05 MED ORDER — VANCOMYCIN HCL IN DEXTROSE 1-5 GM/200ML-% IV SOLN
1000.0000 mg | Freq: Once | INTRAVENOUS | Status: DC
  Filled 2023-01-05: qty 200

## 2023-01-05 MED ORDER — HYDROCODONE-ACETAMINOPHEN 5-325 MG PO TABS
1.0000 | ORAL_TABLET | Freq: Four times a day (QID) | ORAL | Status: DC | PRN
  Administered 2023-01-06 – 2023-01-15 (×9): 1 via ORAL
  Filled 2023-01-05 (×9): qty 1

## 2023-01-05 MED ORDER — INSULIN ASPART 100 UNIT/ML IJ SOLN: 0.0000 [IU] | Freq: Every day | INTRAMUSCULAR | Status: DC

## 2023-01-05 MED ORDER — ATORVASTATIN CALCIUM 20 MG PO TABS
40.0000 mg | ORAL_TABLET | Freq: Every day | ORAL | Status: DC
  Administered 2023-01-06 – 2023-01-15 (×10): 40 mg via ORAL
  Filled 2023-01-05 (×10): qty 2

## 2023-01-05 MED ORDER — ALLOPURINOL 100 MG PO TABS
300.0000 mg | ORAL_TABLET | Freq: Every day | ORAL | Status: DC
  Administered 2023-01-06 – 2023-01-15 (×10): 300 mg via ORAL
  Filled 2023-01-05 (×10): qty 3

## 2023-01-05 MED ORDER — SODIUM CHLORIDE 0.9 % IV BOLUS (SEPSIS)
250.0000 mL | Freq: Once | INTRAVENOUS | Status: AC
  Administered 2023-01-05: 250 mL via INTRAVENOUS

## 2023-01-05 MED ORDER — VITAMIN B-12 1000 MCG PO TABS
1000.0000 ug | ORAL_TABLET | Freq: Every day | ORAL | Status: DC
  Administered 2023-01-06 – 2023-01-15 (×10): 1000 ug via ORAL
  Filled 2023-01-05 (×10): qty 1

## 2023-01-05 MED ORDER — VANCOMYCIN HCL 1250 MG/250ML IV SOLN: 1250.0000 mg | INTRAVENOUS | Status: DC

## 2023-01-05 MED ORDER — LISINOPRIL 10 MG PO TABS
5.0000 mg | ORAL_TABLET | Freq: Every day | ORAL | Status: DC
  Administered 2023-01-06 – 2023-01-09 (×3): 5 mg via ORAL
  Filled 2023-01-05 (×4): qty 1

## 2023-01-05 MED ORDER — POLYETHYLENE GLYCOL 3350 17 G PO PACK: 17.0000 g | PACK | Freq: Every day | ORAL | Status: DC | PRN

## 2023-01-05 MED ORDER — ENOXAPARIN SODIUM 40 MG/0.4ML IJ SOSY
40.0000 mg | PREFILLED_SYRINGE | INTRAMUSCULAR | Status: DC
  Administered 2023-01-05 – 2023-01-14 (×10): 40 mg via SUBCUTANEOUS
  Filled 2023-01-05 (×10): qty 0.4

## 2023-01-05 MED ORDER — ACETAMINOPHEN 650 MG RE SUPP: 650.0000 mg | Freq: Four times a day (QID) | RECTAL | Status: DC | PRN

## 2023-01-05 MED ORDER — GADOBUTROL 1 MMOL/ML IV SOLN
5.0000 mL | Freq: Once | INTRAVENOUS | Status: AC | PRN
  Administered 2023-01-05: 5 mL via INTRAVENOUS

## 2023-01-05 MED ORDER — ONDANSETRON HCL 4 MG/2ML IJ SOLN: 4.0000 mg | Freq: Four times a day (QID) | INTRAMUSCULAR | Status: DC | PRN

## 2023-01-05 MED ORDER — FLUCONAZOLE 100 MG PO TABS
100.0000 mg | ORAL_TABLET | Freq: Every day | ORAL | Status: AC
  Administered 2023-01-06 – 2023-01-11 (×6): 100 mg via ORAL
  Filled 2023-01-05 (×6): qty 1

## 2023-01-05 MED ORDER — ATENOLOL 50 MG PO TABS
50.0000 mg | ORAL_TABLET | Freq: Every day | ORAL | Status: DC
  Administered 2023-01-06 – 2023-01-15 (×9): 50 mg via ORAL
  Filled 2023-01-05 (×10): qty 1

## 2023-01-05 NOTE — Assessment & Plan Note (Addendum)
Continue levothyroxine 

## 2023-01-05 NOTE — Consult Note (Signed)
Pharmacy Antibiotic Note  [name] is a 82 y.o. female admitted on 01/05/2023 with  wound infection .  Pharmacy has been consulted for vancomycin and cefepime dosing.  Plan: Cefepime 2g IV every 12 hours Vancomycin '1000mg'$  IV x1 followed by vancomycin '1250mg'$  IV every 48 hours Goal AUC 400-550  Est AUC: 498.7 Est Cmax: 41.5 Est Cmin: 8.9 Calculated with SCr 0.9   Height: '4\' 11"'$  (149.9 cm) Weight: 52.6 kg (116 lb) IBW/kg (Calculated) : 43.2  Temp (24hrs), Avg:97.9 F (36.6 C), Min:97.9 F (36.6 C), Max:97.9 F (36.6 C)  Recent Labs  Lab 01/05/23 1529  WBC 11.8*  CREATININE 0.90  LATICACIDVEN 3.1*    Estimated Creatinine Clearance: 37.6 mL/min (by C-G formula based on SCr of 0.9 mg/dL).    Allergies  Allergen Reactions   Other Anaphylaxis    Anesthisia has been a health issue for her in the past.    Sulfa Antibiotics Rash    Antimicrobials this admission: 2/24 vancomycin >>  2/24 cefepime >>  Microbiology results: 2/24 BCx: sent   Thank you for allowing pharmacy to be a part of this patient's care.  Darrick Penna 01/05/2023 5:05 PM

## 2023-01-05 NOTE — Consult Note (Signed)
PHARMACY -  BRIEF ANTIBIOTIC NOTE   Pharmacy has received consult(s) for vancomycin from an ED provider.  The patient's profile has been reviewed for ht/wt/allergies/indication/available labs.    One time order(s) placed for vancomycin '1000mg'$  IV x1   Further antibiotics/pharmacy consults should be ordered by admitting physician if indicated.                       Thank you, Darrick Penna 01/05/2023  4:35 PM

## 2023-01-05 NOTE — ED Triage Notes (Signed)
Patient is here today for worsening wounds to her RIGHT hip, sacrum, heels, and feet; Patient is wheelchair bound and has home health/wound care come to her house to care for the wounds; Patient's VS are stable upon arrival to ED

## 2023-01-05 NOTE — Hospital Course (Signed)
Ms. Chantavia Kramarz is a 82 year old female with history of chronic wounds, gout, hypertension, hyperlipidemia, iron deficiency anemia, acquired hypothyroid, non-insulin-dependent diabetes mellitus, who presents emergency department for chief concerns of worsening right hip wound.  Initial vitals in the ED showed temperature of 97.9, respiration rate of 18, heart rate of 89, blood pressure 126/63, SpO2 100% on room air.  Serum sodium is 136, potassium 3.5, chloride 103, bicarb 20, BUN of 11, serum creatinine of 0.90, EGFR greater than 60, nonfasting glucose 89, WBC 11.8, hemoglobin 9.3, platelets of 398.  High sensitive troponin is 14.  ED treatment: Unasyn per pharmacy, vancomycin,

## 2023-01-05 NOTE — ED Provider Notes (Signed)
[facility] Provider Note    Event Date/Time   First MD Initiated Contact with Patient 01/05/23 1509     (approximate)   History   Wound Infection (Patient is here today for worsening wounds to her RIGHT hip, sacrum, heels, and feet; Patient is wheelchair bound and has home health/wound care come to her house to care for the wounds; Patient's VS are stable upon arrival to ED)   HPI  [name] is a 82 y.o. female with CHF, hypertension, hyperlipidemia, DVT status post IVC, CKD diverticulitis gout chronic pressure ulcer of the right hip and ulcers of her bilateral feet who is wheelchair dependent at baseline who comes in with concerns for worsening wound.  Patient been followed by wound care after patient was discharged and noted the right hip to become to get larger almost doubling in size overnight with increasing drainage and concern for worsening infection requiring IV antibiotics.  Patient had to be admitted on 2/7 due to sepsis from infection and she was on oral antibiotics for about a week.  When she was admitted she was on IV vancomycin and Unasyn.  She was then discharged on Augmentin and linezolid for 7 days.  On review of the photos from when she was admitted I do not see any photos of the right hip some unclear what this looks like at baseline.  Patient herself just reports increasing pain.  No fevers no falls no chest pain no shortness of breath.  She just reports increasing pain secondary to the wounds.      Physical Exam   Triage Vital Signs: ED Triage Vitals  Enc Vitals Group     BP 01/05/23 1512 126/63     Pulse Rate 01/05/23 1512 89     Resp 01/05/23 1512 19     Temp 01/05/23 1512 97.9 F (36.6 C)     Temp Source 01/05/23 1512 Oral     SpO2 01/05/23 1512 100 %     Weight 01/05/23 1511 116 lb (52.6 kg)     Height 01/05/23 1511 '4\' 11"'$  (1.499 m)     Head Circumference --      Peak Flow --      Pain Score 01/05/23 1513 8     Pain Loc  --      Pain Edu? --      Excl. in [place]? --     Most recent vital signs: Vitals:   01/05/23 1512  BP: 126/63  Pulse: 89  Resp: 19  Temp: 97.9 F (36.6 C)  SpO2: 100%     General: Awake, no distress.  CV:  Good peripheral perfusion.  Resp:  Normal effort.  Abd:  No distention.  Other:  Patient has about baseball sized ulceration with eschar noted on top of it with some drainage coming out of it of her right hip.  She is also got multiple ulcerations of her feet with some redness going up her legs.  Her feet feel warm and well-perfused.   ED Results / Procedures / Treatments   Labs (all labs ordered are listed, but only abnormal results are displayed) Labs Reviewed  CULTURE, BLOOD (ROUTINE X 2)  CULTURE, BLOOD (ROUTINE X 2)  CBC WITH DIFFERENTIAL/PLATELET  COMPREHENSIVE METABOLIC PANEL  LACTIC ACID, PLASMA  LACTIC ACID, PLASMA     EKG  My interpretation of EKG:  Atrial fibrillation rate of 87 without any ST elevation, however there is a ton of artifact so it is  difficult to get a great view of this EKG so I will repeat the EKG.  Repeat EKG is sinus rate of 86 without any ST elevations but significant T wave inversions in V2 through V6 which do appear new from prior with a prolonged QTc of 584.     PROCEDURES:  Critical Care performed: No  .1-3 Lead EKG Interpretation  Performed by: Vanessa [place], MD Authorized by: Vanessa [employer], MD     Interpretation: normal     ECG rate:  90   ECG rate assessment: normal     Rhythm: sinus rhythm     Ectopy: none     Conduction: normal      MEDICATIONS ORDERED IN ED: Medications - No data to display   IMPRESSION / MDM / [place] / ED COURSE  I reviewed the triage vital signs and the nursing notes.   Patient's presentation is most consistent with acute presentation with potential threat to life or bodily function.   Patient comes in with concerns for worsening wound.  Patient has eschar wound noted on  the right hip with drainage coming out of it with some surrounding erythema concerning for active infection.  She is not currently septic but is a history of going septic previously from these therefore I have low threshold to readmit patient.  Unfortunately I have looked through both the the [facility] admission summary, the hospitalist admission summary and all the photos that were placed during the admission I do not see any documentation of this wound and all have been told from the wound care is that it has significantly increased in size recently making this concerning for actively developing infection.  At this time patient is not septic.  Her EKG was significantly abnormal compared to her priors no significant chest pain but she is a poor historian I will get a repeat troponin given she had an NSTEMI last time she was admitted just to ensure that is not significantly increasing from prior.  CBC shows slightly elevated white count of 11.8.  Hemoglobin stable. CMP is reassuring.  Troponin is negative.  Lactate slightly elevated we will give some fluids.  Patient does not meet sepsis criteria at this time but patient was started on broad-spectrum antibiotics and I did discuss with the hospital team for admission.    The patient is on the cardiac monitor to evaluate for evidence of arrhythmia and/or significant heart rate changes.      FINAL CLINICAL IMPRESSION(S) / ED DIAGNOSES   Final diagnoses:  Wound infection     Rx / DC Orders   ED Discharge Orders     None        Note:  This document was prepared using Dragon voice recognition software and may include unintentional dictation errors.   Vanessa Wheatley, MD 01/05/23 [phone]

## 2023-01-05 NOTE — Assessment & Plan Note (Signed)
Hbg at baseline. Monitor CBC.

## 2023-01-05 NOTE — Progress Notes (Signed)
       CROSS COVER NOTE  NAME: [name] MRN: [id number] DOB : 09/22/1941    HPI/Events of Note   Nurse reports patient in severe pain from wounds - sacrum 10/10 regardless of positioning,hip 7/10 and feet 3/10  Assessment and  Interventions   Assessment: MRI  pelvis confirms osteomyelitis   IMPRESSION: 1. Left paramidline sacral decubitus ulcer measuring up to approximately 2.5 cm in AP depth at the superior aspect of the gluteal crease. There is mild adjacent left sacral marrow edema and enhancement suspicious for active osteomyelitis. 2. There is also a soft tissue ulceration lateral to the right greater trochanter with fluid seen superficial to the anterior right gluteus medius muscle in a region measuring up to approximately 1.9 x 5.6 x 7.1 cm. Focal right greater trochanter superficial marrow edema and enhancement suspicious for early osteomyelitis given the adjacent soft tissue wound.    Plan: Continue vanc and cefepime General surgery previously consulted and will see in am Hydrocodone 5/325 1 tab every 6h prn moderate to severe pain Morphine 1 mg IV for severe pain not alleviated with oral meds       Kathlene Cote NP [facility]

## 2023-01-05 NOTE — Assessment & Plan Note (Addendum)
POA with tachypnea, leukocytosis, elevated lactic acid of 3.1 In setting of wound infection and osteomyelitis. Lactic acid normalized 1.5. --Continue antibiotics per ID - Follow blood cultures

## 2023-01-05 NOTE — ED Notes (Signed)
Pt has just arrived back to room from MRI.  RN will attempt to place IV and another RN will transport pt upstairs.

## 2023-01-05 NOTE — Assessment & Plan Note (Signed)
Sliding scale Novolog

## 2023-01-05 NOTE — Assessment & Plan Note (Addendum)
Secondary to worsening right hip wound --Monitor CBC

## 2023-01-05 NOTE — Assessment & Plan Note (Addendum)
Osteomyelitis of sacrum and right greater trochanter POA - see images in chart from admission on 2/24. --Initially on IV Vanc, Cefepime --Now on IV Daptomycin, Zosyn --Infectious disease consulted  --General surgery consulted for debridement --R hip wound debrided at bedside 2/25 --Sacral wound did not require debridement --Monitor CBC, fever curve --Wound care per orders by [facility] and surgery

## 2023-01-05 NOTE — H&P (Signed)
History and Physical   [name] [id number] DOB: 16-Nov-1940 DOA: 01/05/2023  PCP: Tracie Harrier, MD  Patient coming from: Home  I have personally briefly reviewed patient's old medical records in [place].  Chief Concern: Worsening hip wound  HPI: [name] is a 82 year old female with history of chronic wounds, gout, hypertension, hyperlipidemia, iron deficiency anemia, acquired hypothyroid, non-insulin-dependent diabetes mellitus, who presents emergency department for chief concerns of worsening right hip wound.  Initial vitals in the ED showed temperature of 97.9, respiration rate of 18, heart rate of 89, blood pressure 126/63, SpO2 100% on room air.  Serum sodium is 136, potassium 3.5, chloride 103, bicarb 20, BUN of 11, serum creatinine of 0.90, EGFR greater than 60, nonfasting glucose 89, WBC 11.8, hemoglobin 9.3, platelets of 398.  High sensitive troponin is 14.  ED treatment: Unasyn per pharmacy, vancomycin, ---------------------------- At bedside, she is able to tell me her name, age, location, current calendar year.  She is able to identify her husband at bedside.  She reports that her wound nurse says that these wounds have been getting worse over the last 2 days.  She denies known fever, chills, nausea, vomiting, chest pain, shortness of breath, abdominal pain.  She states the wounds hurt her.  She denies dysuria, hematuria, diarrhea.  Social history: She lives at home with her husband.  She denies tobacco, EtOH, recreational drug use.  ROS: Constitutional: no weight change, no fever ENT/Mouth: no sore throat, no rhinorrhea Eyes: no eye pain, no vision changes Cardiovascular: no chest pain, no dyspnea,  no edema, no palpitations Respiratory: no cough, no sputum, no wheezing Gastrointestinal: no nausea, no vomiting, no diarrhea, no constipation Genitourinary: no urinary incontinence, no dysuria, no hematuria Musculoskeletal: no  arthralgias, no myalgias Skin: + skin lesions, no pruritus, Neuro: + weakness, no loss of consciousness, no syncope Psych: no anxiety, no depression, no decrease appetite Heme/Lymph: no bruising, no bleeding  ED Course: Discussed with emergency medicine provider, patient requiring hospitalization for chief concerns of worsening right hip wound.  Assessment/Plan  Principal Problem:   SIRS (systemic inflammatory response syndrome) (HCC) Active Problems:   Cancer of right breast (HCC)   Chronic ulcer of great toe of left foot with fat layer exposed ([place])   Benign essential hypertension   Chronic diastolic CHF (congestive heart failure) (HCC)   HLD (hyperlipidemia)   Acquired hypothyroidism   Ulcer of both feet, limited to breakdown of skin ([place])   Diabetes mellitus, type 2 (HCC)   PAD (peripheral artery disease) (HCC)   Lymphedema   History of mastectomy, right   Malnutrition of moderate degree   Leukocytosis   Wound infection   Anemia of chronic disease   Assessment and Plan:  * SIRS (systemic inflammatory response syndrome) (HCC) - Elevated respiration rate, WBC 11.8, elevated lactic acid of 3.1 - Blood cultures x 2 are in process - Follow up second lactic acid - Continue with vancomycin and Unasyn per pharmacy  Benign essential hypertension - Lisinopril 5 mg daily and atenolol 50 mg daily for resumed for 2/25  HLD (hyperlipidemia) - Atorvastatin 40 mg daily resumed  Acquired hypothyroidism - Levothyroxine 88 mcg daily resumed  Anemia of chronic disease - At baseline  Wound infection     - Concerning for osteomyelitis at the right hip and sacral/lumbar - MRI of the lumbar and bony pelvis with and without contrast ordered to assess for osteomyelitis - General surgery, general surgery has been consulted for consideration of  the debridement of the right hip and lumbar/sacral wound - Continue Unasyn and vancomycin  Leukocytosis - Presumed secondary to worsening  right hip wound - CBC in a.m.  Diabetes mellitus, type 2 (HCC) - Insulin SSI with at bedtime coverage ordered  Multiple wound to images taken on admission in Epic order.  Chart reviewed.   DVT prophylaxis: Enoxaparin Code Status: full code Diet: Heart healthy/carb modified Family Communication: updated spouse at bedside, with patient's permission Disposition Plan: Pending clinical course Consults called: General surgery, Dr. Dahlia Byes via secure chat and Epic order Admission status: PCU, inpatient  Past Medical History:  Diagnosis Date   Arthritis    Arthritis    Breast cancer ([place]) 1992   right breast with lumpectomy and rad tx   Cancer of right breast ([place]) 04/16/2013   right breast with mastectomy   Diabetes mellitus without complication ([place])    Gout    High cholesterol    Hyperlipidemia    Hypertension    Personal history of radiation therapy    Past Surgical History:  Procedure Laterality Date   ABDOMINAL HYSTERECTOMY     ANKLE FRACTURE SURGERY Right 2004   BREAST LUMPECTOMY Right 1992   positive   IRRIGATION AND DEBRIDEMENT FOOT Right 11/02/2022   Procedure: IRRIGATION AND DEBRIDEMENT RIGHT FOOT AND BONE BIOPSY RIGHT FOOT;  Surgeon: Samara Deist, DPM;  Location: [facility] ORS;  Service: Podiatry;  Laterality: Right;   IVC FILTER INSERTION N/A 11/06/2022   Procedure: IVC FILTER INSERTION;  Surgeon: Algernon Huxley, MD;  Location: [facility];  Service: Cardiovascular;  Laterality: N/A;   MASTECTOMY Right 2014   PERIPHERAL VASCULAR THROMBECTOMY Left 11/06/2022   Procedure: PERIPHERAL VASCULAR THROMBECTOMY- EXTERNAL Iliac Vein/CFV;  Surgeon: Algernon Huxley, MD;  Location: [facility];  Service: Cardiovascular;  Laterality: Left;   SHOULDER SURGERY Right 2010   Social History:  reports that she has never smoked. She has never been exposed to tobacco smoke. She has never used smokeless tobacco. She reports that she does not drink alcohol and does not use  drugs.  Allergies  Allergen Reactions   Other Anaphylaxis    Anesthisia has been a health issue for her in the past.    Sulfa Antibiotics Rash   Family History  Problem Relation Age of Onset   Diabetes Father    Breast cancer Neg Hx    Family history: Family history reviewed and not pertinent.  Prior to Admission medications   Medication Sig Start Date End Date Taking? Authorizing Provider  acetaminophen (TYLENOL) 500 MG tablet Take 500-1,000 mg by mouth 4 (four) times daily as needed for mild pain or moderate pain.   Yes [provider]  allopurinol (ZYLOPRIM) 300 MG tablet Take 300 mg by mouth daily.   Yes [provider]  aspirin EC 81 MG tablet Take 1 tablet (81 mg total) by mouth daily. Swallow whole. 12/25/22  Yes Lorella Nimrod, MD  atenolol (TENORMIN) 50 MG tablet Take 50 mg by mouth daily.   Yes [provider]  atorvastatin (LIPITOR) 40 MG tablet Take 40 mg by mouth daily.    Yes [provider]  diphenoxylate-atropine (LOMOTIL) 2.5-0.025 MG tablet Take 1-2 tablets by mouth 3 (three) times daily as needed. 08/20/22  Yes [provider]  fluconazole (DIFLUCAN) 100 MG tablet Take 100 mg by mouth daily. 01/04/23 01/18/23 Yes [provider]  furosemide (LASIX) 40 MG tablet Take 1 tablet (40 mg total) by mouth daily as  needed for edema or fluid. 12/24/22  Yes Lorella Nimrod, MD  gentamicin cream (GARAMYCIN) 0.1 % Apply 1 Application topically in the morning and at bedtime.   Yes [provider]  HYDROcodone-acetaminophen (NORCO) 7.5-325 MG tablet Take 1 tablet by mouth 2 (two) times daily as needed for moderate pain or severe pain.   Yes [provider]  leptospermum manuka honey (MEDIHONEY) PSTE paste Apply 1 Application topically daily. 12/25/22  Yes Lorella Nimrod, MD  levothyroxine (SYNTHROID) 88 MCG tablet Take 88 mcg by mouth daily.   Yes [provider]  lisinopril (ZESTRIL) 5 MG tablet Take 5 mg by mouth  daily. 09/24/22 09/24/23 Yes [provider]  metFORMIN (GLUCOPHAGE) 1000 MG tablet Take 1,000 mg by mouth in the morning and at bedtime.   Yes [provider]  polyethylene glycol (MIRALAX / GLYCOLAX) 17 g packet Take 17 g by mouth daily. 12/25/22  Yes Lorella Nimrod, MD  sitaGLIPtin (JANUVIA) 50 MG tablet Take 50 mg by mouth daily.   Yes [provider]  vitamin B-12 (CYANOCOBALAMIN) 1000 MCG tablet Take 1,000 mcg by mouth daily.   Yes [provider]  amoxicillin-clavulanate (AUGMENTIN) 875-125 MG tablet Take 1 tablet by mouth 2 (two) times daily. Patient not taking: Reported on 01/05/2023 12/24/22   Lorella Nimrod, MD   Physical Exam: Vitals:   01/05/23 1630 01/05/23 1700 01/05/23 1730 01/05/23 1800  BP: 123/77 124/65 (!) 115/58 121/83  Pulse: 90 88 92 (!) 102  Resp: (!) 21 (!) 22  (!) 28  Temp:      TempSrc:      SpO2: 100% 100% 99% 100%  Weight:      Height:       Constitutional: appears frail, cachectic, NAD, calm, comfortable Eyes: PERRL, lids and conjunctivae normal ENMT: Mucous membranes are moist. Posterior pharynx clear of any exudate or lesions. Age-appropriate dentition. Hearing appropriate Neck: normal, supple, no masses, no thyromegaly Respiratory: clear to auscultation bilaterally, no wheezing, no crackles. Normal respiratory effort. No accessory muscle use.  Cardiovascular: Regular rate and rhythm, no murmurs / rubs / gallops. No extremity edema. 2+ pedal pulses. No carotid bruits.  Abdomen: no tenderness, no masses palpated, no hepatosplenomegaly. Bowel sounds positive.  Musculoskeletal: no clubbing / cyanosis. No joint deformity upper and lower extremities. Good ROM, no contractures, no atrophy. Normal muscle tone.  Skin: + lesions, ulcers. No induration            Neurologic: Sensation intact. Strength 5/5 in all 4.  Psychiatric: Normal judgment and insight. Alert and oriented x 3. Normal mood.   EKG: independently  reviewed, showing sinus rhythm with a rate of 86, QTc 584  Chest x-ray on Admission: not indicated at this time  Labs on Admission: I have personally reviewed following labs  CBC: Recent Labs  Lab 01/05/23 1529  WBC 11.8*  NEUTROABS 10.1*  HGB 9.3*  HCT 30.5*  MCV 81.1  PLT 123456   Basic Metabolic Panel: Recent Labs  Lab 01/05/23 1529  NA 136  K 3.5  CL 103  CO2 20*  GLUCOSE 89  BUN 11  CREATININE 0.90  CALCIUM 8.5*   GFR: Estimated Creatinine Clearance: 37.6 mL/min (by C-G formula based on SCr of 0.9 mg/dL).  Liver Function Tests: Recent Labs  Lab 01/05/23 1529  AST 21  ALT 8  ALKPHOS 50  BILITOT 0.6  PROT 6.8  ALBUMIN 2.5*   Urine analysis:    Component Value Date/Time   COLORURINE YELLOW (A) 12/19/2022  [place] (A) 12/19/2022 0343   APPEARANCEUR Cloudy (A) 09/08/2018 1519   LABSPEC 1.017 12/19/2022 0343   LABSPEC 1.016 05/20/2012 1345   PHURINE 6.0 12/19/2022 0343   GLUCOSEU NEGATIVE 12/19/2022 0343   GLUCOSEU Negative 05/20/2012 1345   HGBUR SMALL (A) 12/19/2022 0343   BILIRUBINUR NEGATIVE 12/19/2022 0343   BILIRUBINUR Negative 09/08/2018 1519   BILIRUBINUR Negative 05/20/2012 1345   KETONESUR NEGATIVE 12/19/2022 0343   PROTEINUR 100 (A) 12/19/2022 0343   NITRITE NEGATIVE 12/19/2022 0343   LEUKOCYTESUR MODERATE (A) 12/19/2022 0343   LEUKOCYTESUR 3+ 05/20/2012 1345   CRITICAL CARE Performed by: Dr. Tobie Poet  Total critical care time: 35 minutes  Critical care time was exclusive of separately billable procedures and treating other patients.  Critical care was necessary to treat or prevent imminent or life-threatening deterioration.  Critical care was time spent personally by me on the following activities: development of treatment plan with patient and/or surrogate as well as nursing, discussions with consultants, evaluation of patient's response to treatment, examination of patient, obtaining history from patient or surrogate,  ordering and performing treatments and interventions, ordering and review of laboratory studies, ordering and review of radiographic studies, pulse oximetry and re-evaluation of patient's condition.  This document was prepared using Dragon Voice Recognition software and may include unintentional dictation errors.  Dr. Tobie Poet [facility]  If 7PM-7AM, please contact overnight-coverage provider If 7AM-7PM, please contact day coverage provider [email or web address]  01/05/2023, 6:23 PM

## 2023-01-05 NOTE — Assessment & Plan Note (Signed)
-  Continue Lipitor °

## 2023-01-05 NOTE — Assessment & Plan Note (Signed)
Continue lisinopril, atenolol

## 2023-01-06 DIAGNOSIS — A419 Sepsis, unspecified organism: Secondary | ICD-10-CM | POA: Diagnosis not present

## 2023-01-06 DIAGNOSIS — R627 Adult failure to thrive: Secondary | ICD-10-CM | POA: Diagnosis not present

## 2023-01-06 DIAGNOSIS — L89213 Pressure ulcer of right hip, stage 3: Secondary | ICD-10-CM | POA: Diagnosis not present

## 2023-01-06 DIAGNOSIS — L89159 Pressure ulcer of sacral region, unspecified stage: Secondary | ICD-10-CM

## 2023-01-06 DIAGNOSIS — R6520 Severe sepsis without septic shock: Secondary | ICD-10-CM | POA: Diagnosis not present

## 2023-01-06 LAB — MRSA NEXT GEN BY PCR, NASAL: MRSA by PCR Next Gen: NOT DETECTED

## 2023-01-06 LAB — PROTIME-INR
INR: 1.5 — ABNORMAL HIGH (ref 0.8–1.2)
Prothrombin Time: 17.6 seconds — ABNORMAL HIGH (ref 11.4–15.2)

## 2023-01-06 LAB — BASIC METABOLIC PANEL
Anion gap: 11 (ref 5–15)
BUN: 10 mg/dL (ref 8–23)
CO2: 21 mmol/L — ABNORMAL LOW (ref 22–32)
Calcium: 8.1 mg/dL — ABNORMAL LOW (ref 8.9–10.3)
Chloride: 105 mmol/L (ref 98–111)
Creatinine, Ser: 0.94 mg/dL (ref 0.44–1.00)
GFR, Estimated: >60 mL/min (ref >60)
Glucose, Bld: 88 mg/dL (ref 70–99)
Potassium: 3.7 mmol/L (ref 3.5–5.1)
Sodium: 137 mmol/L (ref 135–145)

## 2023-01-06 LAB — CBC
HCT: 28 % — ABNORMAL LOW (ref 36.0–46.0)
Hemoglobin: 8.7 g/dL — ABNORMAL LOW (ref 12.0–15.0)
MCH: 24.5 pg — ABNORMAL LOW (ref 26.0–34.0)
MCHC: 31.1 g/dL (ref 30.0–36.0)
MCV: 78.9 fL — ABNORMAL LOW (ref 80.0–100.0)
Platelets: 372 10*3/uL (ref 150–400)
RBC: 3.55 MIL/uL — ABNORMAL LOW (ref 3.87–5.11)
RDW: 18.8 % — ABNORMAL HIGH (ref 11.5–15.5)
WBC: 7.6 10*3/uL (ref 4.0–10.5)
nRBC: 0 % (ref 0.0–0.2)

## 2023-01-06 LAB — GLUCOSE, CAPILLARY
Glucose-Capillary: 98 mg/dL (ref 70–99)
Glucose-Capillary: 113 mg/dL — ABNORMAL HIGH (ref 70–99)
Glucose-Capillary: 121 mg/dL — ABNORMAL HIGH (ref 70–99)
Glucose-Capillary: 103 mg/dL — ABNORMAL HIGH (ref 70–99)

## 2023-01-06 LAB — LACTIC ACID, PLASMA: Lactic Acid, Venous: 1.5 mmol/L (ref 0.5–1.9)

## 2023-01-06 MED ORDER — MORPHINE SULFATE (PF) 2 MG/ML IV SOLN
1.0000 mg | INTRAVENOUS | Status: DC | PRN
  Administered 2023-01-06: 2 mg via INTRAVENOUS
  Filled 2023-01-06: qty 1

## 2023-01-06 MED ORDER — OXYCODONE HCL 5 MG PO TABS
5.0000 mg | ORAL_TABLET | ORAL | Status: DC | PRN
  Administered 2023-01-06 – 2023-01-15 (×17): 5 mg via ORAL
  Filled 2023-01-06 (×18): qty 1

## 2023-01-06 MED ORDER — TRAMADOL HCL 50 MG PO TABS
50.0000 mg | ORAL_TABLET | Freq: Four times a day (QID) | ORAL | Status: DC | PRN
  Administered 2023-01-11 – 2023-01-12 (×2): 50 mg via ORAL
  Filled 2023-01-06 (×2): qty 1

## 2023-01-06 MED ORDER — ZINC SULFATE 220 (50 ZN) MG PO CAPS
220.0000 mg | ORAL_CAPSULE | Freq: Every day | ORAL | Status: DC
  Administered 2023-01-06 – 2023-01-15 (×10): 220 mg via ORAL
  Filled 2023-01-06 (×10): qty 1

## 2023-01-06 MED ORDER — VITAMIN C 500 MG PO TABS
500.0000 mg | ORAL_TABLET | Freq: Two times a day (BID) | ORAL | Status: DC
  Administered 2023-01-06 – 2023-01-15 (×18): 500 mg via ORAL
  Filled 2023-01-06 (×18): qty 1

## 2023-01-06 MED ORDER — ADULT MULTIVITAMIN W/MINERALS CH
1.0000 | ORAL_TABLET | Freq: Every day | ORAL | Status: DC
  Administered 2023-01-06 – 2023-01-15 (×10): 1 via ORAL
  Filled 2023-01-06 (×10): qty 1

## 2023-01-06 MED ORDER — MORPHINE SULFATE (PF) 2 MG/ML IV SOLN: 1.0000 mg | INTRAVENOUS | Status: DC | PRN

## 2023-01-06 MED ORDER — LOPERAMIDE HCL 2 MG PO CAPS
2.0000 mg | ORAL_CAPSULE | ORAL | Status: DC | PRN
  Administered 2023-01-06 – 2023-01-11 (×2): 2 mg via ORAL
  Filled 2023-01-06 (×2): qty 1

## 2023-01-06 MED ORDER — ACETAMINOPHEN 500 MG PO TABS
1000.0000 mg | ORAL_TABLET | Freq: Three times a day (TID) | ORAL | Status: DC
  Administered 2023-01-06 – 2023-01-15 (×29): 1000 mg via ORAL
  Filled 2023-01-06 (×28): qty 2

## 2023-01-06 NOTE — Assessment & Plan Note (Signed)
Noted  

## 2023-01-06 NOTE — Progress Notes (Addendum)
Initial Nutrition Assessment  DOCUMENTATION CODES:   Not applicable  INTERVENTION:   -Liberalize diet to carb modified for wider variety of meal selections -MVI with minerals daily -Magic cup TID with meals, each supplement provides 290 kcal and 9 grams of protein  -500 mg vitamin C BID -220 mg zinc sulfate daily x 14 days  NUTRITION DIAGNOSIS:   Increased nutrient needs related to wound healing as evidenced by estimated needs.  GOAL:   Patient will meet greater than or equal to 90% of their needs  MONITOR:   PO intake, Supplement acceptance  REASON FOR ASSESSMENT:   Consult Assessment of nutrition requirement/status  ASSESSMENT:   Pt with history of chronic wounds, gout, hypertension, hyperlipidemia, iron deficiency anemia, acquired hypothyroid, non-insulin-dependent diabetes mellitus, who presented to the ED on evening of 01/05/23 for evaluation of worsening right hip wound.  Patient's wound nurse reportedly concerned of progressive signs of infection over prior 2-3 days.  Patient reports significant pain related to the hip and sacral wounds.  Pt admitted with severe sepsis and wound infection (osteomyelitis to sacrum and rt greater trochanter). Rt hip wound debrided today.   Reviewed I/O's: +590 ml x 24 hours and -360 ml since admission  Pt unavailable at time of visit. Attempted to speak with pt via call to hospital room phone, however, unable to reach. RD unable to obtain further nutrition-related history or complete nutrition-focused physical exam at this time.     Per [facility] notes, pt with sacral and rt hip pressure injuries with osteomyelitis. Recommending podiatry consult.   Pt currently on a heart healthy/ carb modified diet. No meal completion data currently available at this time.   Reviewed wt hx; wt has been stable over the past 3 months.   Palliative care consult pending for goals of care.   Pt with increased nutritional needs for wound healing and would  greatly beenfit from addition of oral nutrition supplements.   Pt with history of moderate malnutrition, which RD suspects is ongoing.   Medications reviewed and include vitamin B-12 and diflucan.   Lab Results  Component Value Date   HGBA1C 6.1 (H) 12/19/2022   PTA DM medications are 1000 mg metformin BID and 50 mg januiva daily.   Labs reviewed: CBGS: 113 (inpatient orders for glycemic control are 0-5 units insulin aspart daily at bedtime and 0-9 units insulin aspart TID with meals).    Diet Order:   Diet Order             Diet heart healthy/carb modified Room service appropriate? Yes; Fluid consistency: Thin  Diet effective now                   EDUCATION NEEDS:   No education needs have been identified at this time  Skin:  Skin Assessment: Skin Integrity Issues: Skin Integrity Issues:: Stage IV, Unstageable, Stage II, Stage III, Other (Comment) Stage II: lt foot Stage III: rt heel Stage IV: sacrum Unstageable: lt heel Other: venous stasis ulcer to rt heel with eschar, rt hip pressure injury  Last BM:  Unknown  Height:   Ht Readings from Last 1 Encounters:  01/05/23 4' 11"$  (1.499 m)    Weight:   Wt Readings from Last 1 Encounters:  01/05/23 52.6 kg    Ideal Body Weight:  44.7 kg  BMI:  Body mass index is 23.43 kg/m.  Estimated Nutritional Needs:   Kcal:  1800-2000  Protein:  105-120 grams  Fluid:  > 1.8 L  Loistine Chance, RD, LDN, [place] Registered Dietitian II Certified Diabetes Care and Education Specialist Please refer to [facility] for RD and/or RD on-call/weekend/after hours pager

## 2023-01-06 NOTE — Assessment & Plan Note (Signed)
No acute issues.  S/p R mastectomy.

## 2023-01-06 NOTE — Assessment & Plan Note (Signed)
Seems compensated. Monitor volume status.

## 2023-01-06 NOTE — Assessment & Plan Note (Signed)
On ASA, statin

## 2023-01-06 NOTE — Consult Note (Signed)
Patient ID: [name], female   DOB: 10/26/1941, 82 y.o.   MRN: [id number]  HPI [name] is a 82 y.o. female seen in consultation at the request of Dr. Arbutus Ped.  She does have significant comorbidities including severe arthritis, failure to thrive, malnutrition, heart failure, hypertension,DM,  history of breast cancer.  She also had a history of DVT requiring IVC filter.  Presents with failure to thrive and chronic wounds in multiple locations I was asked to see the sacral decubitus ulcer and the right trochanteric ulcer.  Does have mobility issues as he is unable to walk. Has been deteriorating for the last few months  HPI  Past Medical History:  Diagnosis Date   Arthritis    Arthritis    Breast cancer ([place]) 1992   right breast with lumpectomy and rad tx   Cancer of right breast ([place]) 04/16/2013   right breast with mastectomy   Diabetes mellitus without complication ([place])    Gout    High cholesterol    Hyperlipidemia    Hypertension    Personal history of radiation therapy     Past Surgical History:  Procedure Laterality Date   ABDOMINAL HYSTERECTOMY     ANKLE FRACTURE SURGERY Right 2004   BREAST LUMPECTOMY Right 1992   positive   IRRIGATION AND DEBRIDEMENT FOOT Right 11/02/2022   Procedure: IRRIGATION AND DEBRIDEMENT RIGHT FOOT AND BONE BIOPSY RIGHT FOOT;  Surgeon: Samara Deist, DPM;  Location: [facility] ORS;  Service: Podiatry;  Laterality: Right;   IVC FILTER INSERTION N/A 11/06/2022   Procedure: IVC FILTER INSERTION;  Surgeon: Algernon Huxley, MD;  Location: [place] [facility];  Service: Cardiovascular;  Laterality: N/A;   MASTECTOMY Right 2014   PERIPHERAL VASCULAR THROMBECTOMY Left 11/06/2022   Procedure: PERIPHERAL VASCULAR THROMBECTOMY- EXTERNAL Iliac Vein/CFV;  Surgeon: Algernon Huxley, MD;  Location: [place] [facility];  Service: Cardiovascular;  Laterality: Left;   SHOULDER SURGERY Right 2010    Family History  Problem Relation Age of Onset   Diabetes  Father    Breast cancer Neg Hx     Social History Social History   Tobacco Use   Smoking status: Never    Passive exposure: Never   Smokeless tobacco: Never  Vaping Use   Vaping Use: Never used  Substance Use Topics   Alcohol use: No    Alcohol/week: 0.0 standard drinks of alcohol   Drug use: No    Allergies  Allergen Reactions   Other Anaphylaxis    Anesthisia has been a health issue for her in the past.    Sulfa Antibiotics Rash    Current Facility-Administered Medications  Medication Dose Route Frequency Provider Last Rate Last Admin   acetaminophen (TYLENOL) tablet 1,000 mg  1,000 mg Oral Q8H Griffith, Kelly A, DO   1,000 mg at 01/06/23 1343   allopurinol (ZYLOPRIM) tablet 300 mg  300 mg Oral Daily Cox, Amy N, DO   300 mg at 01/06/23 [id number]   ascorbic acid (VITAMIN C) tablet 500 mg  500 mg Oral BID Nicole Kindred A, DO       atenolol (TENORMIN) tablet 50 mg  50 mg Oral Daily Cox, Amy N, DO   50 mg at 01/06/23 0849   atorvastatin (LIPITOR) tablet 40 mg  40 mg Oral Daily Cox, Amy N, DO   40 mg at 01/06/23 0848   ceFEPIme (MAXIPIME) 2 g in sodium chloride 0.9 % 100 mL IVPB  2 g Intravenous Q12H Nilsa Nutting  S, RPH   Stopped at 01/06/23 [id number]   cyanocobalamin (VITAMIN B12) tablet 1,000 mcg  1,000 mcg Oral Daily Cox, Amy N, DO   1,000 mcg at 01/06/23 0848   enoxaparin (LOVENOX) injection 40 mg  40 mg Subcutaneous Q24H Cox, Amy N, DO   40 mg at 01/05/23 2208   fluconazole (DIFLUCAN) tablet 100 mg  100 mg Oral Daily Cox, Amy N, DO   100 mg at 01/06/23 0848   HYDROcodone-acetaminophen (NORCO/VICODIN) 5-325 MG per tablet 1 tablet  1 tablet Oral Q6H PRN Sharion Settler, NP   1 tablet at 01/06/23 1544   insulin aspart (novoLOG) injection 0-5 Units  0-5 Units Subcutaneous QHS Cox, Amy N, DO       insulin aspart (novoLOG) injection 0-9 Units  0-9 Units Subcutaneous TID WC Cox, Amy N, DO       levothyroxine (SYNTHROID) tablet 88 mcg  88 mcg Oral Q0600 Cox, Amy N, DO   88 mcg at  01/06/23 0557   lisinopril (ZESTRIL) tablet 5 mg  5 mg Oral Daily Cox, Amy N, DO   5 mg at 01/06/23 0847   morphine (PF) 2 MG/ML injection 1-2 mg  1-2 mg Intravenous Q3H PRN Nicole Kindred A, DO       multivitamin with minerals tablet 1 tablet  1 tablet Oral Daily Nicole Kindred A, DO       ondansetron (ZOFRAN) tablet 4 mg  4 mg Oral Q6H PRN Cox, Amy N, DO       Or   ondansetron (ZOFRAN) injection 4 mg  4 mg Intravenous Q6H PRN Cox, Amy N, DO       oxyCODONE (Oxy IR/ROXICODONE) immediate release tablet 5 mg  5 mg Oral Q4H PRN Nicole Kindred A, DO   5 mg at 01/06/23 1041   polyethylene glycol (MIRALAX / GLYCOLAX) packet 17 g  17 g Oral Daily PRN Cox, Amy N, DO       traMADol (ULTRAM) tablet 50 mg  50 mg Oral Q6H PRN Nicole Kindred A, DO       vancomycin (VANCOCIN) IVPB 1000 mg/200 mL premix  1,000 mg Intravenous Once Cox, Amy N, DO   Held at 01/05/23 1859   [START ON 01/07/2023] vancomycin (VANCOREADY) IVPB 1250 mg/250 mL  1,250 mg Intravenous Q48H Darrick Penna, RPH       zinc sulfate capsule 220 mg  220 mg Oral Daily Nicole Kindred A, DO         Review of Systems Full ROS  was asked and was negative except for the information on the HPI  Physical Exam Blood pressure (!) 94/54, pulse 72, temperature 98.3 F (36.8 C), resp. rate 18, height '4\' 11"'$  (1.499 m), weight 52.6 kg, SpO2 97 %. CONSTITUTIONAL:  SHe is severely malnourished and chronically debilitated she is laying in bed unable to move motion of the lower extremities EYES: Pupils are equal, round,  Sclera are non-icteric. EARS, NOSE, MOUTH AND THROAT: The oropharynx is clear. The oral mucosa is pink and moist. Hearing is intact to voice. LYMPH NODES:  Lymph nodes in the neck are normal. RESPIRATORY:  Lungs are clear. There is normal respiratory effort, with equal breath sounds bilaterally, and without pathologic use of accessory muscles. CARDIOVASCULAR: Heart is regular without murmurs, gallops, or rubs. GI: The abdomen is   soft, nontender, and nondistended. There are no palpable masses. There is no hepatosplenomegaly. There are normal bowel sounds in all quadrants. GU: Rectal deferred.   MUSCULOSKELETAL: Normal  muscle strength and tone. No cyanosis or edema.   SKIN: There is evidence of a 6-1/2 x 3-1/2 cm right trochanteric decubitus ulcer with a black eschar.  Evidence some necrosis of the skin and subcutaneous tissue.  No evidence of necrotizing infection. There is a second sacral decubitus ulcer measuring 2.5 x 2.5 cm.  There is some decent granulation tissue at the base.  No need for debridement.  No evidence of necrotizing infection   NEUROLOGIC: Motor and sensation is grossly normal. Cranial nerves are grossly intact. PSYCH:  Oriented to person, place and time. Affect is normal.   Media Information   Document Information  Photos  Right trochanteric ulcer  01/06/2023 14:00  Attached To:  Hospital Encounter on 01/05/23  Source Information  Abrahim Sargent, [place], MD  [facility]    Media Information   Document Information  Photos  Decubitus  01/05/2023 17:46  Attached To:  Hospital Encounter on 01/05/23  Source Information  Cox, Amy N, DO  [facility]   Data Reviewed  I have personally reviewed the patient's imaging, laboratory findings and medical records.    Assessment/Plan 82 year old debilitated female with malnutrition and immobility and failure to thrive.  She does have significant comorbidities to include diabetes, severe arthritis, chronic heart failure and malnutrition.  She presents with sacral decubitus ulcers as well as trochanteric decubitus ulcers.  I do think that this is just a reflection of her poor physiological medical reserve.  I did debride the right trochanteric ulcer and she will continue to do dressing changes.  I recommend optimization of nutrition and increasing mobility and offloading pressure points.  Honestly I do not think that her overall  prognosis is good given all the above mentioned serious issues.  She does have osteomyelitis this will continue to be very challenging to treat.  I had an extensive discussion with hospitalist. We will be available. Continue Mepilex dresing changes daily or Every other day.  Please note that I spent 75 minutes in this encounter including personally reviewing imaging studies, coordinating his care, placing orders and performing a proper documentation.     PROCEDURE  Excisional debridement of right trochanteric decubitus ulcer down to muscle measuring 6cm2  After verbal consent was obtained the patient was placed in lateral decubitus position and was prepped and draped in usual sterile fashion.  I was able to perform excisional debridement of the right trochanteric ulcer removing the skin subcutaneous tissue down to muscle.  Hemostasis was obtained with pressure.  Mepilex dressing place      Caroleen Hamman, MD FACS General Surgeon 01/06/2023, 4:21 PM

## 2023-01-06 NOTE — Assessment & Plan Note (Signed)
Chronic.  Monitor.

## 2023-01-06 NOTE — Assessment & Plan Note (Signed)
Wound care per [facility] instructions

## 2023-01-06 NOTE — Assessment & Plan Note (Signed)
- 

## 2023-01-06 NOTE — Progress Notes (Signed)
Progress Note   Patient: [name] [id number] DOB: [birth year] DOA: 01/05/2023     1 DOS: the patient was seen and examined on 01/06/2023   Brief hospital course: [name] is a 82 year old female with history of chronic wounds, gout, hypertension, hyperlipidemia, iron deficiency anemia, acquired hypothyroid, non-insulin-dependent diabetes mellitus, who presented to the ED on evening of 01/05/23 for evaluation of worsening right hip wound.  Patient's wound nurse reportedly concerned of progressive signs of infection over prior 2-3 days.  Patient reports significant pain related to the hip and sacral wounds.  Admitted to medicine service with general surgery consulted for debridement.    On IV Vancomycin and Cefepime for empiric broad spectrum coverage.    Assessment and Plan: * Severe sepsis ([place]) POA with tachypnea, leukocytosis, elevated lactic acid of 3.1 In setting of wound infection and osteomyelitis. --Continue IV Vanc, Cefepime - Follow blood cultures  - Trend lactic acid  Wound infection Osteomyelitis of sacrum and right greater trochanter POA - see images in chart from admission on 2/24. --On IV Vanc, Cefepime --General surgery consulted for debridement --R hip wound debrided today, sacrum did not require debridement --Monitor CBC, fever curve --Wound care per orders by WOC and surgery  Cancer of right breast ([name]) No acute issues.  S/p R mastectomy.  Anemia of chronic disease Hbg at baseline. Monitor CBC.  Chronic diastolic CHF (congestive heart failure) (HCC) Seems compensated. Monitor volume status.  Chronic ulcer of great toe of left foot with fat layer exposed ([place]) Wound care consulted.  Benign essential hypertension Continue lisinopril, atenolol   HLD (hyperlipidemia) Continue Lipitor   Acquired hypothyroidism Continue levothyroxine   Ulcer of both feet, limited to breakdown of skin (HCC) Wound care per WOC  instructions  Leukocytosis Secondary to worsening right hip wound --Monitor CBC  Malnutrition of moderate degree Ensure drinks. Optimize nutrition. Dietitian consult appreciated.  History of mastectomy, right Noted  Lymphedema Chronic.  Monitor.   PAD (peripheral artery disease) (HCC) On ASA, statin  Diabetes mellitus, type 2 (HCC) Sliding scale Novolog        Subjective: Pt seen with husband at bedside today. She reports uncontrolled bottom and hip pain.  No fever or chills.  She asks "why this happened" after being in the hospital multiple times already.     Physical Exam: Vitals:   01/06/23 0035 01/06/23 0555 01/06/23 0816 01/06/23 1155  BP: (!) 119/59 (!) 100/51 (!) 106/55 (!) 93/44  Pulse: 99 98 92 78  Resp: '18 16 20 20  '$ Temp: 97.9 F (36.6 C) 100.2 F (37.9 C) 98.5 F (36.9 C) 98.4 F (36.9 C)  TempSrc:  Oral  Oral  SpO2: 97% 96% 96% 98%  Weight:      Height:       General exam: awake, alert, no acute distress, frail and chronically ill appearing HEENT: moist mucus membranes, hearing grossly normal  Respiratory system: CTAB generally diminished, no wheezes, rales or rhonchi, normal respiratory effort. Cardiovascular system: normal S1/S2, RRR Gastrointestinal system: soft, NT, ND  Central nervous system: A&O x 3. no gross focal neurologic deficits, normal speech Extremities: moves all, no edema, normal tone Skin: dry, intact, normal temperature Psychiatry: anxious mood, congruent affect, judgement and insight appear normal   Data Reviewed:  Notable labs ---- BMP normal except bicarb 21, Ca 8.1. Lactate normalized 1.5.  CBC with Hbg 8.7. WBC normalized to 7.6 from 11.8k.  MRSA screen - not detected   Family Communication: husband at bedside on  rounds  Disposition: Status is: Inpatient Remains inpatient appropriate because: on IV antibiotics and ongoing evaluation of osteomyelitis  Planned Discharge Destination: Home    Time spent: 42  minutes  Author: Ezekiel Slocumb, DO 01/06/2023 2:23 PM  For on call review [email or web address].

## 2023-01-06 NOTE — Consult Note (Addendum)
[place] Nurse Consult Note: Patient with osteomyelitis. Consult to [place] is placed simultaneously with consult to General Surgery. [place] Nursing defers to Surgery for the care and recommendations for sacral pressure injury and right hip wounds in the presence of osteomyelitis.  Recommend podiatric medicine (Podiatry) for bilateral foot wounds. If you agree, please order/arrange consult.  I have provided bilateral pressure redistribution heel boots and a mattress replacement with low air loss feature today. Turning and repositioning is in place, I have provided guidance for Nursing to minimize time in the supine position.  [place] nursing team will not follow, but will remain available to this patient, the nursing and medical teams.  Please re-consult if needed.  Thank you for inviting [place] to participate in this patient's Plan of Care.  Maudie Flakes, MSN, RN, CNS, [place], Serita Grammes, [employer], [employer] phone:  [phone]

## 2023-01-06 NOTE — Assessment & Plan Note (Signed)
Ensure drinks. Optimize nutrition. Dietitian consult appreciated.

## 2023-01-07 DIAGNOSIS — Z66 Do not resuscitate: Secondary | ICD-10-CM

## 2023-01-07 DIAGNOSIS — D638 Anemia in other chronic diseases classified elsewhere: Secondary | ICD-10-CM

## 2023-01-07 DIAGNOSIS — L089 Local infection of the skin and subcutaneous tissue, unspecified: Secondary | ICD-10-CM

## 2023-01-07 DIAGNOSIS — A419 Sepsis, unspecified organism: Secondary | ICD-10-CM | POA: Diagnosis not present

## 2023-01-07 DIAGNOSIS — D72829 Elevated white blood cell count, unspecified: Secondary | ICD-10-CM | POA: Diagnosis not present

## 2023-01-07 DIAGNOSIS — N182 Chronic kidney disease, stage 2 (mild): Secondary | ICD-10-CM | POA: Diagnosis not present

## 2023-01-07 DIAGNOSIS — E440 Moderate protein-calorie malnutrition: Secondary | ICD-10-CM | POA: Diagnosis not present

## 2023-01-07 DIAGNOSIS — T148XXA Other injury of unspecified body region, initial encounter: Secondary | ICD-10-CM

## 2023-01-07 DIAGNOSIS — R6520 Severe sepsis without septic shock: Secondary | ICD-10-CM | POA: Diagnosis not present

## 2023-01-07 DIAGNOSIS — L97522 Non-pressure chronic ulcer of other part of left foot with fat layer exposed: Secondary | ICD-10-CM

## 2023-01-07 DIAGNOSIS — R627 Adult failure to thrive: Secondary | ICD-10-CM | POA: Diagnosis not present

## 2023-01-07 DIAGNOSIS — L89213 Pressure ulcer of right hip, stage 3: Secondary | ICD-10-CM | POA: Diagnosis not present

## 2023-01-07 LAB — CBC
HCT: 26.6 % — ABNORMAL LOW (ref 36.0–46.0)
Hemoglobin: 7.9 g/dL — ABNORMAL LOW (ref 12.0–15.0)
MCH: 24.2 pg — ABNORMAL LOW (ref 26.0–34.0)
MCHC: 29.7 g/dL — ABNORMAL LOW (ref 30.0–36.0)
MCV: 81.6 fL (ref 80.0–100.0)
Platelets: 279 10*3/uL (ref 150–400)
RBC: 3.26 MIL/uL — ABNORMAL LOW (ref 3.87–5.11)
RDW: 18.9 % — ABNORMAL HIGH (ref 11.5–15.5)
WBC: 8.7 10*3/uL (ref 4.0–10.5)
nRBC: 0 % (ref 0.0–0.2)

## 2023-01-07 LAB — GLUCOSE, CAPILLARY
Glucose-Capillary: 103 mg/dL — ABNORMAL HIGH (ref 70–99)
Glucose-Capillary: 112 mg/dL — ABNORMAL HIGH (ref 70–99)
Glucose-Capillary: 117 mg/dL — ABNORMAL HIGH (ref 70–99)
Glucose-Capillary: 107 mg/dL — ABNORMAL HIGH (ref 70–99)

## 2023-01-07 LAB — BASIC METABOLIC PANEL
Anion gap: 5 (ref 5–15)
BUN: 22 mg/dL (ref 8–23)
CO2: 24 mmol/L (ref 22–32)
Calcium: 7.8 mg/dL — ABNORMAL LOW (ref 8.9–10.3)
Chloride: 107 mmol/L (ref 98–111)
Creatinine, Ser: 0.89 mg/dL (ref 0.44–1.00)
GFR, Estimated: >60 mL/min (ref >60)
Glucose, Bld: 104 mg/dL — ABNORMAL HIGH (ref 70–99)
Potassium: 3.9 mmol/L (ref 3.5–5.1)
Sodium: 136 mmol/L (ref 135–145)

## 2023-01-07 LAB — PHOSPHORUS: Phosphorus: 2.7 mg/dL (ref 2.5–4.6)

## 2023-01-07 LAB — MAGNESIUM: Magnesium: 2 mg/dL (ref 1.7–2.4)

## 2023-01-07 MED ORDER — SODIUM CHLORIDE 0.9 % IV SOLN
8.0000 mg/kg | Freq: Every day | INTRAVENOUS | Status: AC
  Administered 2023-01-08 – 2023-01-14 (×7): 400 mg via INTRAVENOUS
  Filled 2023-01-07 (×7): qty 8

## 2023-01-07 MED ORDER — VANCOMYCIN HCL 1250 MG/250ML IV SOLN
1250.0000 mg | Freq: Once | INTRAVENOUS | Status: AC
  Administered 2023-01-07: 1250 mg via INTRAVENOUS
  Filled 2023-01-07: qty 250

## 2023-01-07 MED ORDER — SODIUM CHLORIDE 0.9 % IV SOLN
2.0000 g | Freq: Once | INTRAVENOUS | Status: AC
  Administered 2023-01-07: 2 g via INTRAVENOUS
  Filled 2023-01-07: qty 2

## 2023-01-07 MED ORDER — PIPERACILLIN-TAZOBACTAM 3.375 G IVPB
3.3750 g | Freq: Three times a day (TID) | INTRAVENOUS | Status: AC
  Administered 2023-01-08 – 2023-01-14 (×21): 3.375 g via INTRAVENOUS
  Filled 2023-01-07 (×21): qty 50

## 2023-01-07 MED ORDER — VANCOMYCIN HCL IN DEXTROSE 1-5 GM/200ML-% IV SOLN: 1000.0000 mg | INTRAVENOUS | Status: DC

## 2023-01-07 NOTE — Consult Note (Addendum)
Pharmacy Antibiotic Note  [name] is a 82 y.o. afebrile female admitted on 01/05/2023 with osteomyelitis and wound infections on the right hip, sacrum, heel and, foot . Her WBC and lactic acid trend downwards to normal values, while her procalcitonin value was not significant. MRI revealed osteomyelitis of the sacral bone and greater trochanter. Patient had a past wound culture on 12/19/22 with proteus mirabilis, MRSA, enterococcus faecalis, and rare amounts of prevotella bivia. Bcx on 12/19/22 had growing culture of enterococcus faecalis. Pharmacy has been consulted for vancomycin dosing.  Plan: Continue cefepime 2g q12 hours Initiate a loading dose of 1250 mg of vancomycin followed by an infusion of 1000 mg q 36 hours     --- Goal AUC 400-550     --- Est AUC: 526.8     --- Est Cmax 37.7     --- Est Cmin 11.7 Calculated Scr: 0.89 Calculated Vd: 0.72  Obtain vancomycin levels in 4 days at steady state   Height: '4\' 11"'$  (149.9 cm) Weight: 52.6 kg (116 lb) IBW/kg (Calculated) : 43.2  Temp (24hrs), Avg:98.2 F (36.8 C), Min:97.9 F (36.6 C), Max:98.5 F (36.9 C)  Recent Labs  Lab 01/05/23 1529 01/05/23 2044 01/06/23 0416 01/06/23 0917 01/07/23 0621  WBC 11.8*  --  7.6  --  8.7  CREATININE 0.90  --  0.94  --  0.89  LATICACIDVEN 3.1* 2.3*  --  1.5  --     Estimated Creatinine Clearance: 38 mL/min (by C-G formula based on SCr of 0.89 mg/dL).    Allergies  Allergen Reactions   Other Anaphylaxis    Anesthisia has been a health issue for her in the past.    Sulfa Antibiotics Rash    Antimicrobials this admission: 2/26 vancomycin >>  2/24 cefepime >>  2/24 unasyn >> 2/24  Microbiology results: 2/24 BCx: pending 2/25 MRSA PCR: not detected  Thank you for allowing pharmacy to be a part of this patient's care.  Prinston Kynard 01/07/2023 11:26 AM

## 2023-01-07 NOTE — Consult Note (Signed)
NAME: [name]  DOB: [birth year]  MRN: [id number]  Date/Time: 01/07/2023 12:21 PM  REQUESTING PROVIDER: Dr.Griffith Subjective:  REASON FOR CONSULT: Multiple wounds ? [name] is a 82 y.o. with a history of with a history of  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 and IVC filter placement 11/06/22, Was in [place] recently for altered mental status- 1/4 BC positive for enterococcus along with staph epi- She had b/l heel ulcers and sacral ulcers present for the past 30month- culture from the heel ulcer had MRSA/proteus/enterococcus and anerobesShe was initially given IV and sent home on po Augmentin and linezolid She presented to the ED on 01/05/2023 with worsening concern of right hip wound.  She was asked to go to the ED as the wound nurse felt that the wound was getting worse in the ED temperature was 97.9, BP 126/63, respiratory rate 18, pulse 89 and sats of 100%. WBC was 11.8, Hb 9.3 and platelet 398. Sodium 136, BUN 11 and creatinine 0.90. Blood culture was sent and she was started on Unasyn and vancomycin which was expanded to cefepime and vancomycin.  MRI of the sacrum and pelvis revealed a decubitus as well as osteomyelitis of the sacral bone and the greater trochanter.  She was seen by surgeon who did not think she was a good candidate for surgical debridement.  She was seen by wound care consultant.  Past Medical History:  Diagnosis Date   Arthritis    Arthritis    [place]ast [place] [place]   right breast with lumpectomy and rad tx   [place] of [place] [place]   right breast with mastectomy   Diabetes mellitus without complication ([facility]    Gout    High cholesterol    Hyperlipidemia    Hypertension    Personal history of radiation therapy     Past Surgical History:   Procedure Laterality Date   ABDOMINAL HYSTERECTOMY     ANKLE FRACTURE SURGERY Right 2004   BREAST LUMPECTOMY Right 1992   positive   IRRIGATION AND DEBRIDEMENT FOOT Right 11/02/2022   Procedure: IRRIGATION AND DEBRIDEMENT RIGHT FOOT AND BONE BIOPSY RIGHT FOOT;  Surgeon: FSamara Deist DPM;  Location: [facility];  Service: Podiatry;  Laterality: Right;   IVC FILTER INSERTION N/A 11/06/2022   Procedure: IVC FILTER INSERTION;  Surgeon: DAlgernon Huxley MD;  [facility];  Service: Cardiovascular;  Laterality: N/A;   MASTECTOMY Right 2014   PERIPHERAL VASCULAR THROMBECTOMY Left 11/06/2022   Procedure: PERIPHERAL VASCULAR THROMBECTOMY- EXTERNAL Iliac Vein/CFV;  Surgeon: DAlgernon Huxley MD;  [facility];  Service: Cardiovascular;  Laterality: Left;   SHOULDER SURGERY Right 2010    Social History   Socioeconomic History   Marital status: Married    Spouse name: Not on file   Number of children: 2   Years of education: Not on file   Highest education level: Not on file  Occupational History   Not on file  Tobacco Use   Smoking status: Never    Passive exposure: Never   Smokeless tobacco: Never  Vaping Use   Vaping Use: Never used  Substance and Sexual Activity   Alcohol use: No    Alcohol/week: 0.0 standard drinks of alcohol   Drug use: No   Sexual activity: Not Currently  Other  Topics Concern   Not on file  Social History Narrative   Not on file   Social Determinants of Health   Financial Resource Strain: Not on file  Food Insecurity: No Food Insecurity (12/20/2022)   Hunger Vital Sign    Worried About Running Out of Food in the Last Year: Never true    Ran Out of Food in the Last Year: Never true  Transportation Needs: No Transportation Needs (12/20/2022)   PRAPARE - Hydrologist (Medical): No    Lack of Transportation (Non-Medical): No  Physical Activity: Not on file  Stress: Not on file  Social Connections: Not  on file  Intimate Partner Violence: Not At Risk (12/20/2022)   Humiliation, Afraid, Rape, and Kick questionnaire    Fear of Current or Ex-Partner: No    Emotionally Abused: No    Physically Abused: No    Sexually Abused: No    Family History  Problem Relation Age of Onset   Diabetes Father    Breast cancer Neg Hx    Allergies  Allergen Reactions   Other Anaphylaxis    Anesthisia has been a health issue for her in the past.    Sulfa Antibiotics Rash   current medicines  Tylenol as needed Allopurinol Hydrocodone as needed Morphine as needed Oxycodone Tramadol Cefepime Fluconazole Vancomycin Atenolol Atorvastatin Lisinopril Insulin Synthroid As needed loperamide As needed ondansetron Cyanocobalamin Ascorbic acid Multivitamin zinc    Abtx:  Anti-infectives (From admission, onward)    Start     Dose/Rate Route Frequency Ordered Stop   01/07/23 1800  vancomycin (VANCOREADY) IVPB 1250 mg/250 mL  Status:  Discontinued        1,250 mg 166.7 mL/hr over 90 Minutes Intravenous Every 48 hours 01/05/23 1708 01/07/23 1120   01/07/23 1215  vancomycin (VANCOREADY) IVPB 1250 mg/250 mL        1,250 mg 166.7 mL/hr over 90 Minutes Intravenous  Once 01/07/23 1125     01/06/23 1000  fluconazole (DIFLUCAN) tablet 100 mg        100 mg Oral Daily 01/05/23 1652 01/11/23 2359   01/05/23 2200  ceFEPIme (MAXIPIME) 2 g in sodium chloride 0.9 % 100 mL IVPB        2 g 200 mL/hr over 30 Minutes Intravenous Every 12 hours 01/05/23 1708     01/05/23 1645  Ampicillin-Sulbactam (UNASYN) 3 g in sodium chloride 0.9 % 100 mL IVPB        3 g 200 mL/hr over 30 Minutes Intravenous  Once 01/05/23 1630 01/05/23 1721   01/05/23 1645  vancomycin (VANCOCIN) IVPB 1000 mg/200 mL premix  Status:  Discontinued        1,000 mg 200 mL/hr over 60 Minutes Intravenous  Once 01/05/23 1634 01/07/23 1120       REVIEW OF SYSTEMS:  Const: negative fever, negative chills, ++ weight loss Eyes: negative diplopia or  visual changes, negative eye pain ENT: negative coryza, negative sore throat Resp: negative cough, hemoptysis, dyspnea Cards: negative for chest pain, palpitations, lower extremity edema GU: negative for frequency, dysuria and hematuria GI: Negative for abdominal pain, diarrhea, bleeding, constipation Skin: negative for rash and pruritus Heme: negative for easy bruising and gum/nose bleeding MS: pain , back , muscle weakness Neurolo:, memory problems  Psych:  anxiety, depression  Endocrine: negative for thyroid, diabetes Allergy/Immunology- negative for any medication or food allergies ? Pertinent Positives include : Objective:  VITALS:  BP 126/61 (BP Location: Left Arm)  Pulse 70   Temp 98.2 F (36.8 C) (Oral)   Resp 16   Ht '4\' 11"'$  (1.499 m)   Wt 52.6 kg   SpO2 99%   BMI 23.43 kg/m  LDA Foley Central line Other drainage tubes PHYSICAL EXAM:  General: Alert, cooperative, no distress, appears stated age.  Head: Normocephalic, without obvious abnormality, atraumatic. Eyes: Conjunctivae clear, anicteric sclerae. Pupils are equal ENT Nares normal. No drainage or sinus tenderness. Lips, mucosa, and tongue normal. No Thrush Neck: Supple, symmetrical, no adenopathy, thyroid: non tender no carotid bruit and no JVD. Back: No CVA tenderness. Lungs: Clear to auscultation bilaterally. No Wheezing or Rhonchi. No rales. Heart: Regular rate and rhythm, no murmur, rub or gallop. Abdomen: Soft, non-tender,not distended. Bowel sounds normal. No masses Extremities: atraumatic, no cyanosis. No edema. No clubbing Skin: No rashes or lesions. Or bruising Lymph: Cervical, supraclavicular normal. Neurologic: Grossly non-focal Pertinent Labs Lab Results CBC    Component Value Date/Time   WBC 8.7 01/07/2023 0621   RBC 3.26 (L) 01/07/2023 0621   HGB 7.9 (L) 01/07/2023 0621   HGB 12.0 10/01/2014 1119   HCT 26.6 (L) 01/07/2023 0621   HCT 36.5 10/01/2014 1119   PLT 279 01/07/2023 0621    PLT 233 10/01/2014 1119   MCV 81.6 01/07/2023 0621   MCV 90 10/01/2014 1119   MCH 24.2 (L) 01/07/2023 0621   MCHC 29.7 (L) 01/07/2023 0621   RDW 18.9 (H) 01/07/2023 0621   RDW 14.6 (H) 10/01/2014 1119   LYMPHSABS 1.1 01/05/2023 1529   LYMPHSABS 2.7 10/01/2014 1119   MONOABS 0.5 01/05/2023 1529   MONOABS 0.5 10/01/2014 1119   EOSABS 0.0 01/05/2023 1529   EOSABS 0.1 10/01/2014 1119   BASOSABS 0.0 01/05/2023 1529   BASOSABS 0.1 10/01/2014 1119       Latest Ref Rng & Units 01/07/2023    6:21 AM 01/06/2023    4:16 AM 01/05/2023    3:29 PM  CMP  Glucose 70 - 99 mg/dL 104  88  89   BUN 8 - 23 mg/dL '22  10  11   '$ Creatinine 0.44 - 1.00 mg/dL 0.89  0.94  0.90   Sodium 135 - 145 mmol/L 136  137  136   Potassium 3.5 - 5.1 mmol/L 3.9  3.7  3.5   Chloride 98 - 111 mmol/L 107  105  103   CO2 22 - 32 mmol/L '24  21  20   '$ Calcium 8.9 - 10.3 mg/dL 7.8  8.1  8.5   Total Protein 6.5 - 8.1 g/dL   6.8   Total Bilirubin 0.3 - 1.2 mg/dL   0.6   Alkaline Phos 38 - 126 U/L   50   AST 15 - 41 U/L   21   ALT 0 - 44 U/L   8       Microbiology: Recent Results (from the past 240 hour(s))  Blood culture (routine x 2)     Status: None (Preliminary result)   Collection Time: 01/05/23  3:29 PM   Specimen: BLOOD  Result Value Ref Range Status   Specimen Description BLOOD RIGHT ANTECUBITAL  Final   Special Requests   Final    BOTTLES DRAWN AEROBIC AND ANAEROBIC Blood Culture adequate volume   Culture   Final    NO GROWTH 2 DAYS Performed at [facility], [address], [place], [place] [address]    Report Status PENDING  Incomplete  Blood culture (routine x 2)  Status: None (Preliminary result)   Collection Time: 01/05/23  4:47 PM   Specimen: BLOOD  Result Value Ref Range Status   Specimen Description BLOOD BLOOD RIGHT HAND  Final   Special Requests   Final    BOTTLES DRAWN AEROBIC AND ANAEROBIC Blood Culture adequate volume   Culture   Final    NO GROWTH 2 DAYS Performed at  [facility], [address] [place], [place] [address]    Report Status PENDING  Incomplete  MRSA Next Gen by PCR, Nasal     Status: None   Collection Time: 01/06/23 11:31 AM   Specimen: Nasal Mucosa; Nasal Swab  Result Value Ref Range Status   MRSA by PCR Next Gen NOT DETECTED NOT DETECTED Final    Comment: (NOTE) The GeneXpert MRSA Assay (FDA approved for NASAL specimens only), is one component of a comprehensive MRSA colonization surveillance program. It is not intended to diagnose MRSA infection nor to guide or monitor treatment for MRSA infections. Test performance is not FDA approved in patients less than 65 years old. Performed at [facility], [address] [place], [place] [address]     IMAGING RESULTS: I have personally reviewed the films  MRI of the pelvis and hip shows a sacral decubitus measuring 2.5 cm in depth with sacral marrow edema and enhancement suspicious for active osteomyelitis. At the right greater trochanter there is soft tissue ulceration with fluid superficial to the anterior right gluteus medius muscle measuring 5.6 into 7.1-1.9.  There is also superficial marrow edema of the greater trochanter with enhancement suspicious for early osteomyelitis.  Impression/Recommendation Multiple pressure ulcers with infection involving the sacrum, right hip at the greater trochanter area, bilateral heels Seen by surgery not a candidate for intervention because of the friability Patient is currently on vancomycin and cefepime.  Will change to daptomycin and Zosyn.  Anemia with hemoglobin of 7.9  Recently treated for heel infections with Augmentin and linezolid.  History of Enterococcus bacteremia, treated  Very frail Hypoalbuminemia  ___________________________________________________ Discussed with patient, requesting provider Note:  This document was prepared using Dragon voice recognition software and may include unintentional dictation  errors.

## 2023-01-07 NOTE — Progress Notes (Signed)
Progress Note   Patient: [name] [id number] DOB: [birth year] DOA: 01/05/2023     2 DOS: the patient was seen and examined on 01/07/2023   Brief hospital course: [name] is a 82 year old female with history of chronic wounds, gout, hypertension, hyperlipidemia, iron deficiency anemia, acquired hypothyroid, non-insulin-dependent diabetes mellitus, who presented to the ED on evening of 01/05/23 for evaluation of worsening right hip wound.  Patient's wound nurse reportedly concerned of progressive signs of infection over prior 2-3 days.  Patient reports significant pain related to the hip and sacral wounds.  Admitted to medicine service with general surgery consulted for debridement.    On IV Vancomycin and Cefepime for empiric broad spectrum coverage.    Assessment and Plan: * Severe sepsis ([place]) POA with tachypnea, leukocytosis, elevated lactic acid of 3.1 In setting of wound infection and osteomyelitis. --Continue IV Vanc, Cefepime - Follow blood cultures  - Trend lactic acid  Wound infection Osteomyelitis of sacrum and right greater trochanter POA - see images in chart from admission on 2/24. --On IV Vanc, Cefepime --Infectious disease consulted  --General surgery consulted for debridement --R hip wound debrided at bedside 2/25 --Sacral wound did not require debridement --Monitor CBC, fever curve --Wound care per orders by [facility] and surgery  Cancer of right breast ([place]) No acute issues.  S/p R mastectomy.  Anemia of chronic disease Hbg at baseline. Monitor CBC.  Chronic diastolic CHF (congestive heart failure) (HCC) Seems compensated. Monitor volume status.  Chronic ulcer of great toe of left foot with fat layer exposed ([place]) Wound care consulted.  Benign essential hypertension Continue lisinopril, atenolol   HLD (hyperlipidemia) Continue Lipitor   Acquired hypothyroidism Continue levothyroxine   Ulcer of both feet, limited to breakdown of  skin (HCC) Wound care per WOC instructions  Leukocytosis Secondary to worsening right hip wound --Monitor CBC  Malnutrition of moderate degree Ensure drinks. Optimize nutrition. Dietitian consult appreciated.  History of mastectomy, right Noted  Lymphedema Chronic.  Monitor.   PAD (peripheral artery disease) (HCC) On ASA, statin  Diabetes mellitus, type 2 (HCC) Sliding scale Novolog        Subjective: Pt sleeping comfortably but responded briefly to voice.  No acute complaints.  Pain currently under control.  No acute events reported.   Physical Exam: Vitals:   01/06/23 1931 01/06/23 2322 01/07/23 0534 01/07/23 0936  BP: (!) 124/57 (!) 99/52 (!) 114/58 126/61  Pulse: 74 70 74 70  Resp: 16 16    Temp: 97.9 F (36.6 C) 98.1 F (36.7 C) 98.5 F (36.9 C) 98.2 F (36.8 C)  TempSrc: Oral Oral  Oral  SpO2: 97% 98% 97% 99%  Weight:      Height:       General exam: somnolent, responds briefly to voice, no acute distress, frail and chronically ill appearing Respiratory system: CTAB generally diminished, no wheezes, rales or rhonchi, normal respiratory effort. Cardiovascular system: normal S1/S2, RRR Gastrointestinal system: soft, NT, ND  Central nervous system: A&O x 3. no gross focal neurologic deficits, normal speech Extremities: R lateral hip with foam pressure dressing clean dry intact Skin: dry, intact, normal temperature Psychiatry: anxious mood, congruent affect, judgement and insight appear normal   Data Reviewed:  Notable labs ---- BMP normal except glucose 104, Ca 7.8.  CBC with Hbg 7.9   Family Communication: husband at bedside on rounds 2/25  Disposition: Status is: Inpatient Remains inpatient appropriate because: on IV antibiotics and ongoing evaluation of osteomyelitis  Planned Discharge Destination:  Home    Time spent: 38 minutes  Author: Ezekiel Slocumb, DO 01/07/2023 12:28 PM  For on call review [email or web address].

## 2023-01-07 NOTE — Progress Notes (Signed)
[name] ([id number]) [id number] Page 1 of 18 Visit Report for 01/02/2023 Arrival Information Details Patient Name: Date of Service: [facility], [name]. 01/02/2023 1:00 PM Medical Record Number: [id number] Patient Account Number: [id number] Date of Birth/Sex: Treating RN: 05-06-1941 (82 y.o. [name] Primary Care Jashayla Glatfelter: Tracie Harrier Other Clinician: Massie Kluver Referring Tylin Force: Treating Norine Reddington/Extender: Arn Medal Weeks in Treatment: 14 Visit Information History Since Last Visit All ordered tests and consults were completed: No Patient Arrived: Wheel Chair Added or deleted any medications: No Arrival Time: 13:00 Any new allergies or adverse reactions: No Transfer Assistance: EasyPivot Patient Lift Had a fall or experienced change in No Patient Identification Verified: Yes activities of daily living that may affect Secondary Verification Process Completed: Yes risk of falls: Patient Requires Transmission-Based Precautions: No Signs or symptoms of abuse/neglect since last visito No Patient Has Alerts: Yes Hospitalized since last visit: No Patient Alerts: DM II Implantable device outside of the clinic excluding No ABI R 1.30 TBI 1.12 cellular tissue based products placed in the center ABI [name] 1.27 TBI 1.21 since last visit: Has Dressing in Place as Prescribed: Yes Pain Present Now: Yes Electronic Signature(s) Signed: 01/07/2023 9:50:02 AM By: Massie Kluver Entered By: Massie Kluver on 01/02/2023 13:01:40 -------------------------------------------------------------------------------- Clinic Level of Care Assessment Details Patient Name: Date of Service: [facility], [name] 01/02/2023 1:00 PM Medical Record Number: [id number] Patient Account Number: [id number] Date of Birth/Sex: Treating RN: [birth year] (82 y.o. [name] Primary Care Jobani Sabado: Tracie Harrier Other Clinician: Massie Kluver Referring Kunta Hilleary: Treating Shia Delaine/Extender: Arn Medal Weeks in Treatment: 14 Clinic Level of Care Assessment Items TOOL 4 Quantity Score '[]'$  - 0 Use when only an EandM is performed on FOLLOW-UP visit ASSESSMENTS - Nursing Assessment / Reassessment X- 1 10 Reassessment of Co-morbidities (includes updates in patient status) X- 1 5 Reassessment of Adherence to Treatment Plan BERTIE, [name] ([id number]) [id number] Page 2 of 18 ASSESSMENTS - Wound and Skin A ssessment / Reassessment '[]'$  - 0 Simple Wound Assessment / Reassessment - one wound X- 6 5 Complex Wound Assessment / Reassessment - multiple wounds '[]'$  - 0 Dermatologic / Skin Assessment (not related to wound area) ASSESSMENTS - Focused Assessment '[]'$  - 0 Circumferential Edema Measurements - multi extremities '[]'$  - 0 Nutritional Assessment / Counseling / Intervention '[]'$  - 0 Lower Extremity Assessment (monofilament, tuning fork, pulses) '[]'$  - 0 Peripheral Arterial Disease Assessment (using hand held doppler) ASSESSMENTS - Ostomy and/or Continence Assessment and Care '[]'$  - 0 Incontinence Assessment and Management '[]'$  - 0 Ostomy Care Assessment and Management (repouching, etc.) PROCESS - Coordination of Care X - Simple Patient / Family Education for ongoing care 1 15 '[]'$  - 0 Complex (extensive) Patient / Family Education for ongoing care '[]'$  - 0 Staff obtains Programmer, systems, Records, T Results / Process Orders est '[]'$  - 0 Staff telephones HHA, Nursing Homes / Clarify orders / etc '[]'$  - 0 Routine Transfer to another Facility (non-emergent condition) '[]'$  - 0 Routine Hospital Admission (non-emergent condition) '[]'$  - 0 New Admissions / Biomedical engineer / Ordering NPWT Apligraf, etc. , '[]'$  - 0 Emergency Hospital Admission (emergent condition) X- 1 10 Simple Discharge Coordination '[]'$  - 0 Complex (extensive) Discharge Coordination PROCESS - Special Needs '[]'$  -  0 Pediatric / Minor Patient Management '[]'$  - 0 Isolation Patient Management '[]'$  - 0 Hearing / Language / Visual special needs '[]'$  - 0 Assessment of Community assistance (transportation, D/C planning, etc.) '[]'$  - 0 Additional  assistance / Altered mentation '[]'$  - 0 Support Surface(s) Assessment (bed, cushion, seat, etc.) INTERVENTIONS - Wound Cleansing / Measurement '[]'$  - 0 Simple Wound Cleansing - one wound X- 6 5 Complex Wound Cleansing - multiple wounds X- 1 5 Wound Imaging (photographs - any number of wounds) '[]'$  - 0 Wound Tracing (instead of photographs) '[]'$  - 0 Simple Wound Measurement - one wound X- 6 5 Complex Wound Measurement - multiple wounds INTERVENTIONS - Wound Dressings X - Small Wound Dressing one or multiple wounds 6 10 '[]'$  - 0 Medium Wound Dressing one or multiple wounds '[]'$  - 0 Large Wound Dressing one or multiple wounds X- 1 5 Application of Medications - topical '[]'$  - 0 Application of Medications - injection INTERVENTIONS - Miscellaneous '[]'$  - 0 External ear exam [name] ([id number].pdf Page 3 of 18 '[]'$  - 0 Specimen Collection (cultures, biopsies, blood, body fluids, etc.) '[]'$  - 0 Specimen(s) / Culture(s) sent or taken to Lab for analysis '[]'$  - 0 Patient Transfer (multiple staff / Harrel Lemon Lift / Similar devices) '[]'$  - 0 Simple Staple / Suture removal (25 or less) '[]'$  - 0 Complex Staple / Suture removal (26 or more) '[]'$  - 0 Hypo / Hyperglycemic Management (close monitor of Blood Glucose) '[]'$  - 0 Ankle / Brachial Index (ABI) - do not check if billed separately X- 1 5 Vital Signs Has the patient been seen at the hospital within the last three years: Yes Total Score: 205 Level Of Care: New/Established - Level 5 Electronic Signature(s) Signed: 01/07/2023 9:50:02 AM By: Massie Kluver Entered By: Massie Kluver on 01/02/2023  14:12:48 -------------------------------------------------------------------------------- Encounter Discharge Information Details Patient Name: Date of Service: [facility], [name] 01/02/2023 1:00 PM Medical Record Number: [id number] Patient Account Number: [id number] Date of Birth/Sex: Treating RN: Aug 22, 1941 (82 y.o. [name] Primary Care Khayree Delellis: Tracie Harrier Other Clinician: Massie Kluver Referring Ayliana Casciano: Treating Ja Ohman/Extender: Arn Medal Weeks in Treatment: 28 Encounter Discharge Information Items Discharge Condition: Stable Ambulatory Status: Wheelchair Discharge Destination: Home Transportation: Private Auto Accompanied By: husband Schedule Follow-up Appointment: Yes Clinical Summary of Care: Electronic Signature(s) Signed: 01/07/2023 9:50:02 AM By: Massie Kluver Entered By: Massie Kluver on 01/02/2023 16:45:22 Lower Extremity Assessment Details -------------------------------------------------------------------------------- [name] ([id number]) [id number] Page 4 of 18 Patient Name: Date of Service: [facility], [name] 01/02/2023 1:00 PM Medical Record Number: [id number] Patient Account Number: [id number] Date of Birth/Sex: Treating RN: 05/01/1941 (82 y.o. [name] Primary Care Jorene Kaylor: Tracie Harrier Other Clinician: Massie Kluver Referring Jethro Radke: Treating Caterin Tabares/Extender: Arn Medal Weeks in Treatment: 14 Edema Assessment Left: Right: Assessed: No No Edema: Calf Left: Right: Point of Measurement: 27 cm From Medial Instep Ankle Left: Right: Point of Measurement: 9 cm From Medial Instep Vascular Assessment Left: Right: Pulses: Dorsalis Pedis Palpable: Yes Yes Electronic Signature(s) Signed: 01/03/2023 11:31:26 AM By: Gretta Cool, BSN, RN, CWS, Kim RN, BSN Signed: 01/07/2023 9:50:02 AM By: Massie Kluver Entered By: Massie Kluver on  01/02/2023 13:27:24 -------------------------------------------------------------------------------- Multi Wound Chart Details Patient Name: Date of Service: [facility], [name] 01/02/2023 1:00 PM Medical Record Number: [id number] Patient Account Number: [id number] Date of Birth/Sex: Treating RN: [birth year] (82 y.o. [name] Primary Care Murriel Eidem: Tracie Harrier Other Clinician: Massie Kluver Referring Ludie Pavlik: Treating Tiyonna Sardinha/Extender: Arn Medal Weeks in Treatment: 14 Vital Signs Height(in): Pulse(bpm): 81 Weight(lbs): 116 Blood Pressure(mmHg): 126/67 Body Mass Index(BMI): Temperature(F): 98.2 Respiratory Rate(breaths/min): 16 [10:Photos: No Photos Left Metatarsal head first Wound Location: Pressure Injury  Wounding Event: Pressure Ulcer Primary Etiology: N/A Comorbid History: 10/23/2022 Date Acquired: 10 Weeks of Treatment:] [11:No Photos Right Ischial Tuberosity Pressure Injury Pressure  Ulcer Cataracts, Lymphedema, Hypertension, Peripheral Arterial Disease, Peripheral Venous Disease, Type II Diabetes, Osteoarthritis, Neuropathy 10/24/2022 9] [12:No Photos Right, Medial Ankle Pressure Injury Pressure Ulcer N/A 12/05/2022 4] CHYENNE, EGY ([id number]) [10:Open Wound Status: No Wound Recurrence: 1.3x1.5x0.1 Measurements [name] x W x D (cm) 1.532 A (cm) : rea 0.153 Volume (cm) : 61.00% % Reduction in A rea: 80.50% % Reduction in Volume: Starting Position 1 (o'clock): Ending Position 1 (o'clock): Maximum  Distance 1 (cm): N/A Undermining: Category/Stage IV Classification: Large Exudate A mount: Serosanguineous Exudate Type: red, brown Exudate Color: N/A Foul Odor A Cleansing: fter N/A Odor A nticipated Due to Product Use: N/A Wound Margin: N/A Granulation  A mount: N/A Necrotic A mount: N/A Necrotic Tissue: N/A Epithelialization:] [11:Open No 3.5x9.5x0.9 26.114 23.503 -1159.70% -11254.10% 12 12 2.6 Yes Unstageable/Unclassified None Present N/A N/A N/A  N/A Flat and Intact N/A N/A Eschar None]  [[id number] Page 5 of 18 Open No 3x2.5x0.1 5.89 0.589 -50.00% -49.90% N/A Category/Stage I Medium Serosanguineous red, brown N/A N/A N/A N/A N/A N/A N/A] Wound Number: '13 6 8 '$ Photos: No Photos No Photos No Photos Left Calcaneus Right Calcaneus Midline Sacrum Wound Location: Pressure Injury Pressure Injury Pressure Injury Wounding Event: Pressure Ulcer Pressure Ulcer Pressure Ulcer Primary Etiology: N/A N/A Cataracts, Lymphedema, Comorbid History: Hypertension, Peripheral Arterial Disease, Peripheral Venous Disease, Type II Diabetes, Osteoarthritis, Neuropathy 11/28/2022 07/23/2022 07/23/2022 Date Acquired: '4 14 14 '$ Weeks of Treatment: Open Open Open Wound Status: No No No Wound Recurrence: 2.5x2.5x0.1 2x1.6x0.1 2.9x2.5x1.2 Measurements [name] x W x D (cm) 4.909 2.513 5.694 A (cm) : rea 0.491 0.251 6.833 Volume (cm) : -42.00% 73.30% -3.60% % Reduction in A rea: -41.90% 91.10% -314.40% % Reduction in Volume: N/A N/A N/A Undermining: Category/Stage II Unstageable/Unclassified Unstageable/Unclassified Classification: Medium Medium Medium Exudate A mount: Serosanguineous Serosanguineous Serosanguineous Exudate Type: red, brown red, brown red, brown Exudate Color: N/A N/A Yes Foul Odor A Cleansing: fter N/A N/A No Odor A nticipated Due to Product Use: N/A N/A N/A Wound Margin: N/A N/A None Present (0%) Granulation A mount: N/A N/A Large (67-100%) Necrotic A mount: N/A N/A Adherent Slough Necrotic Tissue: N/A N/A Fat Layer (Subcutaneous Tissue): Yes Exposed Structures: Fascia: No Tendon: No Muscle: No Joint: No Bone: No N/A N/A None Epithelialization: Treatment Notes Electronic Signature(s) Signed: 01/07/2023 9:50:02 AM By: Massie Kluver Entered By: Massie Kluver on 01/02/2023 13:27:30 Fraiser, [name] ([id number]) [id number] Page 6 of  18 -------------------------------------------------------------------------------- Multi-Disciplinary Care Plan Details Patient Name: Date of Service: [facility], [name] 01/02/2023 1:00 PM Medical Record Number: [id number] Patient Account Number: [id number] Date of Birth/Sex: Treating RN: Jul 28, 1941 (82 y.o. [name] Primary Care Lluvia Gwynne: Tracie Harrier Other Clinician: Massie Kluver Referring Camiyah Friberg: Treating Korry Dalgleish/Extender: Arn Medal Weeks in Treatment: 14 Active Inactive Necrotic Tissue Nursing Diagnoses: Impaired tissue integrity related to necrotic/devitalized tissue Knowledge deficit related to management of necrotic/devitalized tissue Goals: Necrotic/devitalized tissue will be minimized in the wound bed Date Initiated: 10/31/2022 Target Resolution Date: 11/07/2022 Goal Status: Active Patient/caregiver will verbalize understanding of reason and process for debridement of necrotic tissue Date Initiated: 10/31/2022 Target Resolution Date: 10/31/2022 Goal Status: Active Interventions: Assess patient pain level pre-, during and post procedure and prior to discharge Provide education on necrotic tissue and debridement process Treatment Activities: Apply topical anesthetic as ordered : 10/31/2022 Biologic debridement : 10/31/2022 Notes: Osteomyelitis  Nursing Diagnoses: Potential for infection: osteomyelitis Goals: Patient/caregiver will verbalize understanding of disease process and disease management Date Initiated: 10/31/2022 Target Resolution Date: 10/31/2022 Goal Status: Active Signs and symptoms for osteomyelitis will be recognized and promptly addressed Date Initiated: 10/31/2022 Target Resolution Date: 10/31/2022 Goal Status: Active Interventions: Assess for signs and symptoms of osteomyelitis resolution every visit Treatment Activities: Systemic antibiotics : 10/31/2022 Notes: Soft Tissue Infection Nursing  Diagnoses: Impaired tissue integrity Potential for infection: soft tissue Goals: Patient/caregiver will verbalize understanding of or measures to prevent infection and contamination in the home setting KAYDYNCE, LUC [name] ([id number]) [id number] Page 7 of 18 Date Initiated: 10/31/2022 Target Resolution Date: 10/31/2022 Goal Status: Active Signs and symptoms of infection will be recognized early to allow for prompt treatment Date Initiated: 10/31/2022 Target Resolution Date: 10/31/2022 Goal Status: Active Interventions: Assess signs and symptoms of infection every visit Notes: Wound/Skin Impairment Nursing Diagnoses: Impaired tissue integrity Goals: Patient/caregiver will verbalize understanding of skin care regimen Date Initiated: 10/31/2022 Date Inactivated: 11/06/2022 Target Resolution Date: 10/31/2022 Goal Status: Met Ulcer/skin breakdown will have a volume reduction of 30% by week 4 Date Initiated: 10/31/2022 Date Inactivated: 11/06/2022 Target Resolution Date: 10/31/2022 Unmet Reason: comorbidities, failure to Goal Status: Unmet thrive Ulcer/skin breakdown will have a volume reduction of 50% by week 8 Date Initiated: 11/06/2022 Target Resolution Date: 11/28/2022 Goal Status: Active Interventions: Assess patient/caregiver ability to perform ulcer/skin care regimen upon admission and as needed Assess ulceration(s) every visit Provide education on ulcer and skin care Treatment Activities: Skin care regimen initiated : 10/31/2022 Topical wound management initiated : 10/31/2022 Notes: Electronic Signature(s) Signed: 01/03/2023 11:31:26 AM By: Gretta Cool, BSN, RN, CWS, Kim RN, BSN Signed: 01/07/2023 9:50:02 AM By: Massie Kluver Entered By: Massie Kluver on 01/02/2023 16:44:19 -------------------------------------------------------------------------------- Pain Assessment Details Patient Name: Date of Service: [facility], [name] 01/02/2023 1:00  PM Medical Record Number: [id number] Patient Account Number: [id number] Date of Birth/Sex: Treating RN: Apr 02, 1941 (82 y.o. [name] Primary Care Jontavius Rabalais: Tracie Harrier Other Clinician: Massie Kluver Referring Keyana Guevara: Treating Kathyleen Radice/Extender: Arn Medal Weeks in Treatment: 14 Active Problems Location of Pain Severity and Description of Pain Patient Has Paino Yes Site Locations Pain Location: LAQUITA, LECOUNT ([id number]) [id number] Page 8 of 18 Pain Location: Generalized Pain, Pain in Ulcers Duration of the Pain. Constant / Intermittento Constant Rate the pain. Current Pain Level: 8 Character of Pain Describe the Pain: Aching, Burning, Other: stinging Pain Management and Medication Current Pain Management: Medication: Yes Cold Application: No Rest: Yes Massage: No Activity: No T.E.N.S.: No Heat Application: No Leg drop or elevation: No Is the Current Pain Management Adequate: Inadequate How does your wound impact your activities of daily livingo Sleep: No Bathing: No Appetite: No Relationship With Others: No Bladder Continence: No Emotions: No Bowel Continence: No Work: No Toileting: No Drive: No Dressing: No Hobbies: No Engineer, maintenance) Signed: 01/03/2023 11:31:26 AM By: Gretta Cool, BSN, RN, CWS, Kim RN, BSN Signed: 01/07/2023 9:50:02 AM By: Massie Kluver Entered By: Massie Kluver on 01/02/2023 13:19:49 -------------------------------------------------------------------------------- Patient/Caregiver Education Details Patient Name: Date of Service: [facility], [name] 2/21/2024andnbsp1:00 PM Medical Record Number: [id number] Patient Account Number: [id number] Date of Birth/Gender: Treating RN: [birth year] (82 y.o. [name] Primary Care Physician: Tracie Harrier Other Clinician: Massie Kluver Referring Physician: Treating Physician/Extender: Arn Medal Weeks in Treatment: 14 Education Assessment Education Provided To: Patient Education Topics Provided Wound/Skin Impairment: Handouts: Other: continue wound care as directed DYONNA, SLEETH [name] ([id number]) [phone].pdf Page 9  of 18 Methods: Explain/Verbal Responses: State content correctly Electronic Signature(s) Signed: 01/07/2023 9:50:02 AM By: Massie Kluver Entered By: Massie Kluver on 01/02/2023 16:44:44 -------------------------------------------------------------------------------- Wound Assessment Details Patient Name: Date of Service: [facility], [name] 01/02/2023 1:00 PM Medical Record Number: [id number] Patient Account Number: [id number] Date of Birth/Sex: Treating RN: 1941-06-09 (82 y.o. Charolette Forward, Kim Primary Care Laurabelle Gorczyca: Tracie Harrier Other Clinician: Massie Kluver Referring Murriel Holwerda: Treating Jacquelyn Antony/Extender: Arn Medal Weeks in Treatment: 14 Wound Status Wound Number: 10 Primary Pressure Ulcer Etiology: Wound Location: Left Metatarsal head first Wound Open Wounding Event: Pressure Injury Status: Date Acquired: 10/23/2022 Comorbid Cataracts, Lymphedema, Hypertension, Peripheral Arterial Disease, Weeks Of Treatment: 10 History: Peripheral Venous Disease, Type II Diabetes, Osteoarthritis, Clustered Wound: No Neuropathy Photos Wound Measurements Length: (cm) 1.3 Width: (cm) 1.5 Depth: (cm) 0.1 Area: (cm) 1.532 Volume: (cm) 0.153 % Reduction in Area: 61% % Reduction in Volume: 80.5% Epithelialization: None Wound Description Classification: Category/Stage IV Wound Margin: Flat and Intact Exudate Amount: Large Exudate Type: Serosanguineous Exudate Color: red, brown Foul Odor After Cleansing: No Slough/Fibrino No Wound Bed Granulation Amount: None Present (0%) Exposed Structure Necrotic Amount: Medium (34-66%) Fascia Exposed: No Necrotic Quality: Bone Fat Layer (Subcutaneous  Tissue) Exposed: Yes Tendon Exposed: No Muscle Exposed: No Hiscox, Wilene [name] ([id number]) [id number] Page 10 of 18 Joint Exposed: No Bone Exposed: Yes Treatment Notes Wound #10 (Metatarsal head first) Wound Laterality: Left Cleanser Dakin 16 (oz) 0.25 Discharge Instruction: Use as directed. Soap and Water Discharge Instruction: Gently cleanse wound with antibacterial soap, rinse and pat dry prior to dressing wounds Peri-Wound Care Topical Primary Dressing Honey: Activon Honey Gel, 25 (g) Tube Hydrofera Blue Ready Transfer Foam, 2.5x2.5 (in/in) Discharge Instruction: Apply Hydrofera Blue Ready to wound bed as directed Secondary Dressing ABD Pad 5x9 (in/in) Discharge Instruction: Cover with ABD pad Secured With Medipore T - 70M Medipore H Soft Cloth Surgical T ape ape, 2x2 (in/yd) Compression Wrap Compression Stockings Add-Ons Electronic Signature(s) Signed: 01/03/2023 11:31:26 AM By: Gretta Cool, BSN, RN, CWS, Kim RN, BSN Signed: 01/07/2023 9:50:02 AM By: Massie Kluver Entered By: Massie Kluver on 01/02/2023 13:30:40 -------------------------------------------------------------------------------- Wound Assessment Details Patient Name: Date of Service: [facility], [name] 01/02/2023 1:00 PM Medical Record Number: [id number] Patient Account Number: [id number] Date of Birth/Sex: Treating RN: 03/24/1941 (82 y.o. [name] Primary Care Leiliana Foody: Tracie Harrier Other Clinician: Massie Kluver Referring Salvadore Valvano: Treating Maicee Ullman/Extender: Arn Medal Weeks in Treatment: 14 Wound Status Wound Number: 11 Primary Pressure Ulcer Etiology: Wound Location: Right Ischial Tuberosity Wound Open Wounding Event: Pressure Injury Status: Date Acquired: 10/24/2022 Comorbid Cataracts, Lymphedema, Hypertension, Peripheral Arterial Disease, Weeks Of Treatment: 9 History: Peripheral Venous Disease, Type II Diabetes,  Osteoarthritis, Clustered Wound: No Neuropathy Photos Ronan, Greenly [name] ([id number]) [id number] Page 11 of 18 Wound Measurements Length: (cm) 3.5 Width: (cm) 9.5 Depth: (cm) 0.9 Area: (cm) 26.114 Volume: (cm) 23.503 % Reduction in Area: -1159.7% % Reduction in Volume: -11254.1% Epithelialization: None Undermining: Yes Starting Position (o'clock): 12 Ending Position (o'clock): 12 Maximum Distance: (cm) 2.6 Wound Description Classification: Unstageable/Unclassified Wound Margin: Flat and Intact Exudate Amount: None Present Wound Bed Necrotic Amount: Medium (34-66%) Exposed Structure Necrotic Quality: Eschar Fascia Exposed: No Fat Layer (Subcutaneous Tissue) Exposed: Yes Tendon Exposed: No Muscle Exposed: No Joint Exposed: No Bone Exposed: No Treatment Notes Wound #11 (Ischial Tuberosity) Wound Laterality: Right Cleanser Dakin 16 (oz) 0.25 Discharge Instruction: Use as directed. Soap and Water Discharge Instruction: Gently cleanse wound with antibacterial soap, rinse and pat dry  prior to dressing wounds Peri-Wound Care Topical Primary Dressing Gauze Discharge Instruction: dakins wet to dry Secondary Dressing (BORDER) Zetuvit Plus SILICONE BORDER Dressing 5x5 (in/in) Discharge Instruction: Please do not put silicone bordered dressings under wraps. Use non-bordered dressing only. Secured With [place] H Soft Cloth Surgical T ape ape, 2x2 (in/yd) Compression Wrap Compression Stockings Environmental education officer) Signed: 01/03/2023 11:31:26 AM By: Gretta Cool, BSN, RN, CWS, Kim RN, BSN Signed: 01/07/2023 9:50:02 AM By: Massie Kluver Entered By: Massie Kluver on 01/02/2023 13:30:59 Coard, [name] ([id number]) [id number] Page 12 of 18 -------------------------------------------------------------------------------- Wound Assessment Details Patient Name: Date of Service: [facility], [name]  01/02/2023 1:00 PM Medical Record Number: [id number] Patient Account Number: [id number] Date of Birth/Sex: Treating RN: [birth year] (82 y.o. [name] Primary Care Cortny Bambach: Tracie Harrier Other Clinician: Massie Kluver Referring Alysse Rathe: Treating Kenslie Abbruzzese/Extender: Arn Medal Weeks in Treatment: 14 Wound Status Wound Number: 12 Primary Pressure Ulcer Etiology: Wound Location: Right, Medial Ankle Wound Open Wounding Event: Pressure Injury Status: Date Acquired: 12/05/2022 Comorbid Cataracts, Lymphedema, Hypertension, Peripheral Arterial Disease, Weeks Of Treatment: 4 History: Peripheral Venous Disease, Type II Diabetes, Osteoarthritis, Clustered Wound: No Neuropathy Photos Wound Measurements Length: (cm) 3 Width: (cm) 2. Depth: (cm) 0. Area: (cm) 5 Volume: (cm) 0 % Reduction in Area: -50% 5 % Reduction in Volume: -49.9% 1 Epithelialization: None .89 .589 Wound Description Classification: Category/Stage I Wound Margin: Flat and Intact Exudate Amount: Medium Exudate Type: Serosanguineous Exudate Color: red, brown Foul Odor After Cleansing: No Slough/Fibrino Yes Wound Bed Exposed Structure Fascia Exposed: No Fat Layer (Subcutaneous Tissue) Exposed: Yes Tendon Exposed: No Muscle Exposed: No Joint Exposed: No Bone Exposed: No Treatment Notes Wound #12 (Ankle) Wound Laterality: Right, Medial Cleanser Dakin 16 (oz) 0.25 Discharge Instruction: Use as directed. ALLYX, PINNIX ([id number]) [id number] Page 13 of 18 Soap and Water Discharge Instruction: Gently cleanse wound with antibacterial soap, rinse and pat dry prior to dressing wounds Peri-Wound Care Topical Primary Dressing Honey: Activon Honey Gel, 25 (g) Tube Secondary Dressing Hydrofera Blue Ready Transfer Foam, 2.5x2.5 (in/in) Discharge Instruction: Apply to wound bed over non-stick dressing. (BORDER) Zetuvit Plus SILICONE BORDER Dressing 5x5  (in/in) Discharge Instruction: Please do not put silicone bordered dressings under wraps. Use non-bordered dressing only. Secured With Medipore T - 30M Medipore H Soft Cloth Surgical T ape ape, 2x2 (in/yd) Compression Wrap Compression Stockings Environmental education officer) Signed: 01/03/2023 11:31:26 AM By: Gretta Cool, BSN, RN, CWS, Kim RN, BSN Signed: 01/07/2023 9:50:02 AM By: Massie Kluver Entered By: Massie Kluver on 01/02/2023 13:31:18 -------------------------------------------------------------------------------- Wound Assessment Details Patient Name: Date of Service: [facility], [name] 01/02/2023 1:00 PM Medical Record Number: [id number] Patient Account Number: [id number] Date of Birth/Sex: Treating RN: Jan 01, 1941 (82 y.o. Charolette Forward, Kim Primary Care Bruce Churilla: Tracie Harrier Other Clinician: Massie Kluver Referring Laramie Gelles: Treating Webster Patrone/Extender: Arn Medal Weeks in Treatment: 14 Wound Status Wound Number: 13 Primary Pressure Ulcer Etiology: Wound Location: Left Calcaneus Wound Open Wounding Event: Pressure Injury Status: Date Acquired: 11/28/2022 Comorbid Cataracts, Lymphedema, Hypertension, Peripheral Arterial Disease, Weeks Of Treatment: 4 History: Peripheral Venous Disease, Type II Diabetes, Osteoarthritis, Clustered Wound: No Neuropathy Photos Wound Measurements Length: (cm) 2.5 Sikora, Carmaleta [name] ([id number]) Width: (cm) 2.5 Depth: (cm) 0.1 Area: (cm) 4.909 Volume: (cm) 0.491 % Reduction in Area: -42% [id number] Page 14 of 18 % Reduction in Volume: -41.9% Epithelialization: None Wound Description Classification: Category/Stage II Wound Margin: Flat and Intact Exudate Amount: Medium Exudate Type: Serosanguineous Exudate Color:  red, brown Foul Odor After Cleansing: No Slough/Fibrino Yes Wound Bed Granulation Amount: None Present (0%) Exposed Structure Necrotic Amount: Large  (67-100%) Fascia Exposed: No Necrotic Quality: Eschar Fat Layer (Subcutaneous Tissue) Exposed: Yes Tendon Exposed: No Muscle Exposed: No Joint Exposed: No Bone Exposed: No Treatment Notes Wound #13 (Calcaneus) Wound Laterality: Left Cleanser Dakin 16 (oz) 0.25 Discharge Instruction: Use as directed. Soap and Water Discharge Instruction: Gently cleanse wound with antibacterial soap, rinse and pat dry prior to dressing wounds Peri-Wound Care Topical Primary Dressing Honey: Activon Honey Gel, 25 (g) Tube Secondary Dressing Hydrofera Blue Ready Transfer Foam, 2.5x2.5 (in/in) Discharge Instruction: Apply to wound bed over non-stick dressing. (BORDER) Zetuvit Plus SILICONE BORDER Dressing 5x5 (in/in) Discharge Instruction: Please do not put silicone bordered dressings under wraps. Use non-bordered dressing only. Secured With [place] H Soft Cloth Surgical T ape ape, 2x2 (in/yd) Compression Wrap Compression Stockings Environmental education officer) Signed: 01/03/2023 11:31:26 AM By: Gretta Cool, BSN, RN, CWS, Kim RN, BSN Signed: 01/07/2023 9:50:02 AM By: Massie Kluver Entered By: Massie Kluver on 01/02/2023 13:31:35 -------------------------------------------------------------------------------- Wound Assessment Details Patient Name: Date of Service: [facility], [name] 01/02/2023 1:00 PM Medical Record Number: [id number] Patient Account Number: [id number] SMITH, ARMS ([id number]) [id number] Page 15 of 18 Date of Birth/Sex: Treating RN: 1941-01-08 (82 y.o. Charolette Forward, Kim Primary Care Phat Dalton: Tracie Harrier Other Clinician: Massie Kluver Referring Sunya Humbarger: Treating Yashua Bracco/Extender: Arn Medal Weeks in Treatment: 14 Wound Status Wound Number: 6 Primary Pressure Ulcer Etiology: Wound Location: Right Calcaneus Wound Open Wounding Event: Pressure Injury Status: Date Acquired: 07/23/2022 Comorbid  Cataracts, Lymphedema, Hypertension, Peripheral Arterial Disease, Weeks Of Treatment: 14 History: Peripheral Venous Disease, Type II Diabetes, Osteoarthritis, Clustered Wound: No Neuropathy Photos Wound Measurements Length: (cm) 2 Width: (cm) 1.6 Depth: (cm) 0.1 Area: (cm) 2.513 Volume: (cm) 0.251 % Reduction in Area: 73.3% % Reduction in Volume: 91.1% Epithelialization: None Wound Description Classification: Unstageable/Unclassified Exudate Amount: Medium Exudate Type: Serosanguineous Exudate Color: red, brown Foul Odor After Cleansing: Yes Due to Product Use: No Slough/Fibrino Yes Wound Bed Granulation Amount: None Present (0%) Exposed Structure Necrotic Amount: Large (67-100%) Fascia Exposed: No Necrotic Quality: Eschar Fat Layer (Subcutaneous Tissue) Exposed: Yes Tendon Exposed: No Muscle Exposed: No Joint Exposed: No Bone Exposed: No Treatment Notes Wound #6 (Calcaneus) Wound Laterality: Right Cleanser [place] Ancillary Kit - 15 Day Supply Discharge Instruction: Use supplies as instructed; Kit contains: (15) Saline Bullets; (15) 3x3 Gauze; 15 pr Gloves Dakin 16 (oz) 0.25 Discharge Instruction: Use as directed. Soap and Water Discharge Instruction: Gently cleanse wound with antibacterial soap, rinse and pat dry prior to dressing wounds Peri-Wound Care Topical Activon Honey Gel, 25 (g) Tube Primary Dressing Hydrofera Blue Ready Transfer Foam, 2.5x2.5 (in/in) Discharge Instruction: Apply Hydrofera Blue Ready to wound bed as directed Secondary Dressing ABD Pad 5x9 (in/in) Discharge Instruction: Cover with ABD pad Isakson, Faiza [name] ([id number]) [id number] Page 16 of 18 Kerlix 4.5 x 4.1 (in/yd) Discharge Instruction: Apply Kerlix 4.5 x 4.1 (in/yd) as instructed Secured With [place] H Soft Cloth Surgical T ape ape, 2x2 (in/yd) Compression Wrap Compression Stockings Add-Ons Electronic Signature(s) Signed: 01/03/2023  11:31:26 AM By: Gretta Cool, BSN, RN, CWS, Kim RN, BSN Signed: 01/07/2023 9:50:02 AM By: Massie Kluver Entered By: Massie Kluver on 01/02/2023 13:31:55 -------------------------------------------------------------------------------- Wound Assessment Details Patient Name: Date of Service: [facility], [name] 01/02/2023 1:00 PM Medical Record Number: [id number] Patient Account Number: [id number] Date of Birth/Sex: Treating RN: [birth year] (81  y.o. Charolette Forward, Kim Primary Care Ciarrah Rae: Tracie Harrier Other Clinician: Massie Kluver Referring Juwan Vences: Treating Mathew Storck/Extender: Arn Medal Weeks in Treatment: 14 Wound Status Wound Number: 8 Primary Pressure Ulcer Etiology: Wound Location: Midline Sacrum Wound Open Wounding Event: Pressure Injury Status: Date Acquired: 07/23/2022 Comorbid Cataracts, Lymphedema, Hypertension, Peripheral Arterial Disease, Weeks Of Treatment: 14 History: Peripheral Venous Disease, Type II Diabetes, Osteoarthritis, Clustered Wound: No Neuropathy Photos Wound Measurements Length: (cm) 2.9 Width: (cm) 2.5 Depth: (cm) 1.2 Area: (cm) 5.694 Volume: (cm) 6.833 % Reduction in Area: -3.6% % Reduction in Volume: -314.4% Epithelialization: None Wound Description Classification: Unstageable/Unclassified Exudate Amount: Medium Exudate Type: Serosanguineous Exudate Color: red, brown Krah, Raigan [name] ([id number]) Wound Bed Granulation Amount: None Present (0%) Necrotic Amount: Large (67-100%) Necrotic Quality: Adherent Slough Foul Odor After Cleansing: Yes Due to Product Use: No Slough/Fibrino Yes [id number] Page 17 of 18 Exposed Structure Fascia Exposed: No Fat Layer (Subcutaneous Tissue) Exposed: Yes Tendon Exposed: No Muscle Exposed: No Joint Exposed: No Bone Exposed: No Treatment Notes Wound #8 (Sacrum) Wound Laterality: Midline Cleanser Dakin 16 (oz) 0.25 Discharge Instruction: Use as  directed. Soap and Water Discharge Instruction: Gently cleanse wound with antibacterial soap, rinse and pat dry prior to dressing wounds Peri-Wound Care Topical Primary Dressing Gauze Discharge Instruction: moistened with Dakins Solution for wet to dry dressing Secondary Dressing ABD Pad 5x9 (in/in) Discharge Instruction: Cover with ABD pad Secured With [place] Surgical T ape ape, 2x2 (in/yd) Compression Wrap Compression Stockings Add-Ons Electronic Signature(s) Signed: 01/03/2023 11:31:26 AM By: Gretta Cool, BSN, RN, CWS, Kim RN, BSN Signed: 01/07/2023 9:50:02 AM By: Massie Kluver Entered By: Massie Kluver on 01/02/2023 13:32:17 -------------------------------------------------------------------------------- Vitals Details Patient Name: Date of Service: [facility], [name] 01/02/2023 1:00 PM Medical Record Number: [id number] Patient Account Number: [id number] Date of Birth/Sex: Treating RN: 09-Apr-1941 (82 y.o. Charolette Forward, Kim Primary Care Claudett Bayly: Tracie Harrier Other Clinician: Massie Kluver Referring Marcelle Hepner: Treating Lynanne Delgreco/Extender: Arn Medal Weeks in Treatment: 14 Vital Signs Time Taken: 13:02 Temperature (F): 98.2 Weight (lbs): 116 Pulse (bpm): 81 Respiratory Rate (breaths/min): 16 Blood Pressure (mmHg): 126/67 Reference Range: 80 - 120 mg / dl League, Tyara [name] ([id number]) [id number] Page 18 of 18 Electronic Signature(s) Signed: 01/07/2023 9:50:02 AM By: Massie Kluver Entered By: Massie Kluver on 01/02/2023 13:04:53

## 2023-01-07 NOTE — Consult Note (Signed)
Consultation Note Date: 01/07/2023   Patient Name: [name]  DOB: 20-Jun-1941  MRN: [id number]  Age / Sex: 82 y.o., female  PCP: Tracie Harrier, MD Referring Physician: Ezekiel Slocumb, DO  Reason for Consultation: Establishing goals of care  HPI/Patient Profile: 82 y.o. female  admitted on 01/05/2023 with PMH significant for   chronic wounds, gout, hypertension, hyperlipidemia, iron deficiency anemia, hypothyroid, non-insulin-dependent diabetes mellitus, who presented to the ED on evening of 01/05/23 for evaluation of worsening right hip wound.    Patient's wound care nurse reportedly concerned of progressive signs of infection over prior 2-3 days. Reported significant pain related to the hip and sacral wounds.   Admitted to medicine service with general surgery consulted for debridement.    On IV Vancomycin and Cefepime for empiric broad spectrum coverage.  Overall failure to thrive, long term poor prognosis.  Poor oral intake  Patient and family face treatment option decisions, advanced directive decisions and anticipatory care needs.  Clinical Assessment and Goals of Care:  This NP Wadie Lessen reviewed medical records, received report from team, assessed the patient and then meet at the patient's bedside and spoke to her son/ Erick Colace by telephone  to discuss diagnosis, prognosis, [place], EOL wishes disposition and options.   Concept of Palliative Care was introduced as specialized medical care for people and their families living with serious illness.  If focuses on providing relief from the symptoms and stress of a serious illness.  The goal is to improve quality of life for both the patient and the family.  Values and goals of care important to patient and family were attempted to be elicited.  Created space and opportunity for family to explore thoughts and feelings regarding  current medical situation.   Roderic Palau continue sto make sense of what he perceives to be a happened so fast" situation.   He reports decline since last August.  He verbalizes his thoughts that "if we can get a few cases of Ensure into her will help with the wounds".   Education offered on overall skin failure and the incredible caloric demands of wound healing.  He does not want to "give up" on his mother  He lovingly speak to this mother strength and "will to fight'.  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     A  discussion was had today regarding advanced directives.  Concepts specific to code status, artifical feeding and hydration, continued IV antibiotics and rehospitalization was had.    The difference between a aggressive medical intervention path  and a palliative comfort care path for this patient at this time was had.       Natural trajectory and expectations at EOL were discussed.  Questions and concerns addressed.  Patient  encouraged to call with questions or concerns.     PMT will continue to support holistically.           No documented H POA or advanced care  planning documents.  Patient's husband and son make decisions together for patient's best interest in decisions      SUMMARY OF RECOMMENDATIONS    Code Status/Advance Care Planning: Full code Educated patient/family to consider DNR/DNI status understanding evidenced based poor outcomes in similar hospitalized patient, as the cause of arrest is likely associated with advanced chronic illness rather than an easily reversible acute cardio-pulmonary event.   Symptom Management:  Pain-scheduled Tylenol 1000 mg every 8 hours         -morphine IV 1 to 2 mg every 3 hours as needed         -Oxycodone 5 mg p.o. every 4 hours as needed  Palliative Prophylaxis:  Aspiration, Bowel Regimen, Delirium Protocol, Frequent Pain Assessment, and Oral  Care  Additional Recommendations (Limitations, Scope, Preferences): Full Scope Treatment  Psycho-social/Spiritual:  Desire for further Chaplaincy support:no Additional Recommendations: Education on Hospice  Prognosis:  Unable to determine  Discharge Planning: To Be Determined      Primary Diagnoses: Present on Admission:  Wound infection  Acquired hypothyroidism  Benign essential hypertension  Cancer of right breast (HCC)  Chronic diastolic CHF (congestive heart failure) (HCC)  Chronic ulcer of great toe of left foot with fat layer exposed (HCC)  HLD (hyperlipidemia)  Leukocytosis  Malnutrition of moderate degree  PAD (peripheral artery disease) (HCC)  Ulcer of both feet, limited to breakdown of skin (HCC)  Severe sepsis (HCC)  Anemia of chronic disease   I have reviewed the medical record, interviewed the patient and family, and examined the patient. The following aspects are pertinent.  Past Medical History:  Diagnosis Date   Arthritis    Arthritis    Breast cancer ([place]) 1992   right breast with lumpectomy and rad tx   Cancer of right breast ([place]) 04/16/2013   right breast with mastectomy   Diabetes mellitus without complication (HCC)    Gout    High cholesterol    Hyperlipidemia    Hypertension    Personal history of radiation therapy    Social History   Socioeconomic History   Marital status: Married    Spouse name: Not on file   Number of children: 2   Years of education: Not on file   Highest education level: Not on file  Occupational History   Not on file  Tobacco Use   Smoking status: Never    Passive exposure: Never   Smokeless tobacco: Never  Vaping Use   Vaping Use: Never used  Substance and Sexual Activity   Alcohol use: No    Alcohol/week: 0.0 standard drinks of alcohol   Drug use: No   Sexual activity: Not Currently  Other Topics Concern   Not on file  Social History Narrative   Not on file   Social Determinants of Health    Financial Resource Strain: Not on file  Food Insecurity: No Food Insecurity (12/20/2022)   Hunger Vital Sign    Worried About Running Out of Food in the Last Year: Never true    Ran Out of Food in the Last Year: Never true  Transportation Needs: No Transportation Needs (12/20/2022)   PRAPARE - Hydrologist (Medical): No    Lack of Transportation (Non-Medical): No  Physical Activity: Not on file  Stress: Not on file  Social Connections: Not on file   Family History  Problem Relation Age of Onset   Diabetes Father    Breast cancer Neg Hx  Scheduled Meds:  acetaminophen  1,000 mg Oral Q8H   allopurinol  300 mg Oral Daily   vitamin C  500 mg Oral BID   atenolol  50 mg Oral Daily   atorvastatin  40 mg Oral Daily   cyanocobalamin  1,000 mcg Oral Daily   enoxaparin (LOVENOX) injection  40 mg Subcutaneous Q24H   fluconazole  100 mg Oral Daily   insulin aspart  0-5 Units Subcutaneous QHS   insulin aspart  0-9 Units Subcutaneous TID WC   levothyroxine  88 mcg Oral Q0600   lisinopril  5 mg Oral Daily   multivitamin with minerals  1 tablet Oral Daily   zinc sulfate  220 mg Oral Daily   Continuous Infusions:  ceFEPime (MAXIPIME) IV 2 g (01/07/23 1132)   [START ON 01/09/2023] vancomycin     PRN Meds:.HYDROcodone-acetaminophen, loperamide, morphine injection, ondansetron **OR** ondansetron (ZOFRAN) IV, oxyCODONE, polyethylene glycol, traMADol Medications Prior to Admission:  Prior to Admission medications   Medication Sig Start Date End Date Taking? Authorizing Provider  acetaminophen (TYLENOL) 500 MG tablet Take 500-1,000 mg by mouth 4 (four) times daily as needed for mild pain or moderate pain.   Yes [provider]  allopurinol (ZYLOPRIM) 300 MG tablet Take 300 mg by mouth daily.   Yes [provider]  aspirin EC 81 MG tablet Take 1 tablet (81 mg total) by mouth daily. Swallow whole. 12/25/22  Yes Lorella Nimrod, MD  atenolol (TENORMIN) 50  MG tablet Take 50 mg by mouth daily.   Yes [provider]  atorvastatin (LIPITOR) 40 MG tablet Take 40 mg by mouth daily.    Yes [provider]  diphenoxylate-atropine (LOMOTIL) 2.5-0.025 MG tablet Take 1-2 tablets by mouth 3 (three) times daily as needed. 08/20/22  Yes [provider]  fluconazole (DIFLUCAN) 100 MG tablet Take 100 mg by mouth daily. 01/04/23 01/18/23 Yes [provider]  furosemide (LASIX) 40 MG tablet Take 1 tablet (40 mg total) by mouth daily as needed for edema or fluid. 12/24/22  Yes Lorella Nimrod, MD  gentamicin cream (GARAMYCIN) 0.1 % Apply 1 Application topically in the morning and at bedtime.   Yes [provider]  HYDROcodone-acetaminophen (NORCO) 7.5-325 MG tablet Take 1 tablet by mouth 2 (two) times daily as needed for moderate pain or severe pain.   Yes [provider]  leptospermum manuka honey (MEDIHONEY) PSTE paste Apply 1 Application topically daily. 12/25/22  Yes Lorella Nimrod, MD  levothyroxine (SYNTHROID) 88 MCG tablet Take 88 mcg by mouth daily.   Yes [provider]  lisinopril (ZESTRIL) 5 MG tablet Take 5 mg by mouth daily. 09/24/22 09/24/23 Yes [provider]  metFORMIN (GLUCOPHAGE) 1000 MG tablet Take 1,000 mg by mouth in the morning and at bedtime.   Yes [provider]  polyethylene glycol (MIRALAX / GLYCOLAX) 17 g packet Take 17 g by mouth daily. 12/25/22  Yes Lorella Nimrod, MD  sitaGLIPtin (JANUVIA) 50 MG tablet Take 50 mg by mouth daily.   Yes [provider]  vitamin B-12 (CYANOCOBALAMIN) 1000 MCG tablet Take 1,000 mcg by mouth daily.   Yes [provider]  amoxicillin-clavulanate (AUGMENTIN) 875-125 MG tablet Take 1 tablet by mouth 2 (two) times daily. Patient not taking: Reported on 01/05/2023 12/24/22   Lorella Nimrod, MD   Allergies  Allergen Reactions   Other Anaphylaxis    Anesthisia has been a health issue for her in the past.    Sulfa Antibiotics  Rash  Review of Systems  Unable to perform ROS: Acuity of condition    Physical Exam Constitutional:      Appearance: She is cachectic. She is ill-appearing.  Cardiovascular:     Rate and Rhythm: Normal rate.  Pulmonary:     Effort: Pulmonary effort is normal.  Musculoskeletal:     Comments: Generalized weakness and muscle atrophy   Skin:    General: Skin is warm and dry.     Comments: Noted severe multiple wounds  Neurological:     Mental Status: She is lethargic and confused.     Vital Signs: BP 126/61 (BP Location: Left Arm)   Pulse 70   Temp 98.2 F (36.8 C) (Oral)   Resp 16   Ht '4\' 11"'$  (1.499 m)   Wt 52.6 kg   SpO2 99%   BMI 23.43 kg/m  Pain Scale: 0-10   Pain Score: 4    SpO2: SpO2: 99 % O2 Device:SpO2: 99 % O2 Flow Rate: .   IO: Intake/output summary:  Intake/Output Summary (Last 24 hours) at 01/07/2023 1546 Last data filed at 01/06/2023 2300 Gross per 24 hour  Intake 120 ml  Output 1000 ml  Net -880 ml    LBM: Last BM Date :  (PTA) Baseline Weight: Weight: 52.6 kg Most recent weight: Weight: 52.6 kg     Palliative Assessment/Data:  30 % at best     Time: 70 minutes   Detailed review of medical records ( labs, imaging, vital signs), medically appropriate exam ( MS, skin, resp)   discussed with treatment team, counseling and education to patient, family, staff, documenting clinical information, medication management, coordination of care    Signed by: Wadie Lessen, NP   Please contact Palliative Medicine Team phone at [phone] for questions and concerns.  For individual provider: See Shea Evans

## 2023-01-07 NOTE — Consult Note (Signed)
Pharmacy Antibiotic Note  [name] is a 82 y.o. afebrile female admitted on 01/05/2023 with osteomyelitis and wound infections on the right hip, sacrum, heel and, foot . Her WBC and lactic acid trend downwards to normal values, while her procalcitonin value was not significant. MRI revealed osteomyelitis of the sacral bone and greater trochanter. Patient had a past wound culture on 12/19/22 with proteus mirabilis, MRSA, enterococcus faecalis, and rare prevotella bivia. Blood culture on 12/19/22 had growing culture of enterococcus faecalis. She was discharged 12/24/22 on linezolid and amoxicillin/clavulanate. She would have completed these antibiotics 12/31/22. Pharmacy has been consulted for daptomycin and piperacillin/tazobactam dosing. ID following  Today, 01/07/2023 Day #2 Cefepime, D#1 vancomycin (dose not given 2/25) Renal: SCr WNL WBC WNL Afebrile CK ordered for am  Plan: Start piperacillin/tazobactam 3.375gm IV q8h over 4h infusion 2/27 morning (stop cefepime after tonight's dose) Daptomycin '400mg'$  ('8mg'$ /kg) IV q24h starting 2/27 8p Give 1st dose when next dose of vancomycin would have been due Check CK 2/27 am Monitor renal function and cultures   Height: '4\' 11"'$  (149.9 cm) Weight: 52.6 kg (116 lb) IBW/kg (Calculated) : 43.2  Temp (24hrs), Avg:98.2 F (36.8 C), Min:97.9 F (36.6 C), Max:98.5 F (36.9 C)  Recent Labs  Lab 01/05/23 1529 01/05/23 2044 01/06/23 0416 01/06/23 0917 01/07/23 0621  WBC 11.8*  --  7.6  --  8.7  CREATININE 0.90  --  0.94  --  0.89  LATICACIDVEN 3.1* 2.3*  --  1.5  --      Estimated Creatinine Clearance: 38 mL/min (by C-G formula based on SCr of 0.89 mg/dL).    Allergies  Allergen Reactions   Other Anaphylaxis    Anesthisia has been a health issue for her in the past.    Sulfa Antibiotics Rash    Antimicrobials this admission: 2/27 >>Daptomycin >> 2/27 pip/tazo >> 2/26 vancomycin >> 2/27 2/24 cefepime >> 2/27 2/24 unasyn >>  2/24  Microbiology results: 2/24 BCx: NGTD 2/25 MRSA PCR: not detected  Thank you for allowing pharmacy to be a part of this patient's care.  Doreene Eland, PharmD, BCPS, BCIDP Work Cell: [phone] 01/07/2023 4:16 PM

## 2023-01-08 ENCOUNTER — Ambulatory Visit: Payer: PPO | Admitting: Infectious Diseases

## 2023-01-08 DIAGNOSIS — R6520 Severe sepsis without septic shock: Secondary | ICD-10-CM | POA: Diagnosis not present

## 2023-01-08 DIAGNOSIS — R531 Weakness: Secondary | ICD-10-CM

## 2023-01-08 DIAGNOSIS — A419 Sepsis, unspecified organism: Secondary | ICD-10-CM | POA: Diagnosis not present

## 2023-01-08 LAB — CBC
HCT: 32.2 % — ABNORMAL LOW (ref 36.0–46.0)
Hemoglobin: 9.5 g/dL — ABNORMAL LOW (ref 12.0–15.0)
MCH: 24.4 pg — ABNORMAL LOW (ref 26.0–34.0)
MCHC: 29.5 g/dL — ABNORMAL LOW (ref 30.0–36.0)
MCV: 82.6 fL (ref 80.0–100.0)
Platelets: 333 10*3/uL (ref 150–400)
RBC: 3.9 MIL/uL (ref 3.87–5.11)
RDW: 18.8 % — ABNORMAL HIGH (ref 11.5–15.5)
WBC: 11 10*3/uL — ABNORMAL HIGH (ref 4.0–10.5)
nRBC: 0 % (ref 0.0–0.2)

## 2023-01-08 LAB — GLUCOSE, CAPILLARY
Glucose-Capillary: 94 mg/dL (ref 70–99)
Glucose-Capillary: 98 mg/dL (ref 70–99)
Glucose-Capillary: 77 mg/dL (ref 70–99)

## 2023-01-08 LAB — CK: Total CK: 161 U/L (ref 38–234)

## 2023-01-08 MED ORDER — CHLORHEXIDINE GLUCONATE CLOTH 2 % EX PADS
6.0000 | MEDICATED_PAD | Freq: Every day | CUTANEOUS | Status: DC
  Administered 2023-01-08 – 2023-01-15 (×8): 6 via TOPICAL

## 2023-01-08 NOTE — Progress Notes (Signed)
Date of Admission:  01/05/2023   Total days of antibiotics ***        Day ***        Day ***        Day ***   ID: [name] is a 82 y.o. female with  *** Principal Problem:   Severe sepsis ([place]) Active Problems:   Diabetes mellitus, type 2 ([place])   HLD (hyperlipidemia)   Cancer of right breast ([place])   PAD (peripheral artery disease) (HCC)   Lymphedema   Acquired hypothyroidism   Benign essential hypertension   History of mastectomy, right   Chronic diastolic CHF (congestive heart failure) (HCC)   Malnutrition of moderate degree   Chronic ulcer of great toe of left foot with fat layer exposed (HCC)   Ulcer of both feet, limited to breakdown of skin (HCC)   Leukocytosis   Wound infection   Anemia of chronic disease   Decubitus ulcer of trochanter, right, stage III (HCC)    Subjective: ***  Medications:   acetaminophen  1,000 mg Oral Q8H   allopurinol  300 mg Oral Daily   vitamin C  500 mg Oral BID   atenolol  50 mg Oral Daily   atorvastatin  40 mg Oral Daily   Chlorhexidine Gluconate Cloth  6 each Topical Daily   cyanocobalamin  1,000 mcg Oral Daily   enoxaparin (LOVENOX) injection  40 mg Subcutaneous Q24H   fluconazole  100 mg Oral Daily   insulin aspart  0-5 Units Subcutaneous QHS   insulin aspart  0-9 Units Subcutaneous TID WC   levothyroxine  88 mcg Oral Q0600   lisinopril  5 mg Oral Daily   multivitamin with minerals  1 tablet Oral Daily   zinc sulfate  220 mg Oral Daily    Objective: Vital signs in last 24 hours: Temp:  [97.8 F (36.6 C)-99.3 F (37.4 C)] 99.3 F (37.4 C) (02/27 1146) Pulse Rate:  [63-85] 64 (02/27 1146) Resp:  [16-20] 16 (02/27 1146) BP: (82-156)/(42-78) 82/42 (02/27 1146) SpO2:  [98 %-100 %] 98 % (02/27 1146)  LDA Foley Central lines Other catheters  PHYSICAL EXAM:  General: Alert, cooperative, no distress, appears stated age.  Head: Normocephalic, without obvious abnormality, atraumatic. Eyes: Conjunctivae clear,  anicteric sclerae. Pupils are equal ENT Nares normal. No drainage or sinus tenderness. Lips, mucosa, and tongue normal. No Thrush Neck: Supple, symmetrical, no adenopathy, thyroid: non tender no carotid bruit and no JVD. Back: No CVA tenderness. Lungs: Clear to auscultation bilaterally. No Wheezing or Rhonchi. No rales. Heart: Regular rate and rhythm, no murmur, rub or gallop. Abdomen: Soft, non-tender,not distended. Bowel sounds normal. No masses Extremities: atraumatic, no cyanosis. No edema. No clubbing Skin: No rashes or lesions. Or bruising Lymph: Cervical, supraclavicular normal. Neurologic: Grossly non-focal  Lab Results Recent Labs    01/06/23 0416 01/07/23 0621 01/08/23 0628  WBC 7.6 8.7 11.0*  HGB 8.7* 7.9* 9.5*  HCT 28.0* 26.6* 32.2*  NA 137 136  --   K 3.7 3.9  --   CL 105 107  --   CO2 21* 24  --   BUN 10 22  --   CREATININE 0.94 0.89  --    Liver Panel Recent Labs    01/05/23 1529  PROT 6.8  ALBUMIN 2.5*  AST 21  ALT 8  ALKPHOS 50  BILITOT 0.6   Sedimentation Rate No results for input(s): "ESRSEDRATE" in the last 72 hours. C-Reactive Protein No results for  input(s): "CRP" in the last 72 hours.  Microbiology:  Studies/Results: No results found.   Assessment/Plan: Multiple pressure ulcers with infection involving the sacrum, right hip at the greater trochanter area, bilateral heels Seen by surgery not a candidate for intervention because of the friability Patient is currently on vancomycin and cefepime.  Will change to daptomycin and Zosyn.   Anemia with hemoglobin of 7.9   Recently treated for heel infections with Augmentin and linezolid.   History of Enterococcus bacteremia, treated   Very frail

## 2023-01-08 NOTE — Progress Notes (Addendum)
Progress Note   Patient: [name] [id number] DOB: 07/03/1941 DOA: 01/05/2023     3 DOS: the patient was seen and examined on 01/08/2023   Brief hospital course: Ms. Frona Fennell is a 82 year old female with history of chronic wounds, gout, hypertension, hyperlipidemia, iron deficiency anemia, acquired hypothyroid, non-insulin-dependent diabetes mellitus, who presented to the ED on evening of 01/05/23 for evaluation of worsening right hip wound.  Patient's wound nurse reportedly concerned of progressive signs of infection over prior 2-3 days.  Patient reports significant pain related to the hip and sacral wounds.  Admitted to medicine service with general surgery consulted for debridement.    Started on empiric IV Vancomycin and Cefepime for empiric broad spectrum coverage.  Infectious disease consulted.  Antibiotics were changed to IV Daptomycin and Zosyn.  Palliative Care consulted for goals of care discussions.  Patient is debilitated, malnourished, chronically ill.  She has poor long term prognosis and is high risk for morbidity and mortality.    Assessment and Plan: * Severe sepsis ([place]) POA with tachypnea, leukocytosis, elevated lactic acid of 3.1 In setting of wound infection and osteomyelitis. Lactic acid normalized 1.5. --Continue antibiotics per ID - Follow blood cultures    Wound infection Osteomyelitis of sacrum and right greater trochanter POA - see images in chart from admission on 2/24. --Initially on IV Vanc, Cefepime --Now on IV Daptomycin, Zosyn --Infectious disease consulted  --General surgery consulted for debridement --R hip wound debrided at bedside 2/25 --Sacral wound did not require debridement --Monitor CBC, fever curve --Wound care per orders by [facility] and surgery  Cancer of right breast ([place]) No acute issues.  S/p R mastectomy.  Anemia of chronic disease Hbg at baseline. Monitor CBC.  Chronic diastolic CHF (congestive heart  failure) (HCC) Seems compensated. Monitor volume status.  Chronic ulcer of great toe of left foot with fat layer exposed ([place]) Wound care consulted.  Benign essential hypertension Continue lisinopril, atenolol   HLD (hyperlipidemia) Continue Lipitor   Acquired hypothyroidism Continue levothyroxine   CKD (chronic kidney disease) stage 2, GFR 60-89 ml/min Renal function stable. Montior.   Ulcer of both feet, limited to breakdown of skin (HCC) Wound care per WOC instructions  Leukocytosis Secondary to worsening right hip wound --Monitor CBC  Malnutrition of moderate degree Ensure drinks. Optimize nutrition. Dietitian consult appreciated.  History of mastectomy, right Noted  Lymphedema Chronic.  Monitor.   PAD (peripheral artery disease) (HCC) On ASA, statin  Diabetes mellitus, type 2 (HCC) Sliding scale Novolog        Subjective: Pt was seen with husband at bedside.  Palliative care met with pt and family earlier.  Pt reports pain currently under control.  No fever/chills or other acute complaints.     Physical Exam: Vitals:   01/08/23 0041 01/08/23 0403 01/08/23 0900 01/08/23 1146  BP: (!) 137/58 (!) 147/75 97/71 (!) 82/42  Pulse: 63 69 85 64  Resp: '18 18 20 16  '$ Temp: 97.8 F (36.6 C) 98 F (36.7 C) 98 F (36.7 C) 99.3 F (37.4 C)  TempSrc: Oral Oral Axillary   SpO2: 100% 98% 100% 98%  Weight:      Height:       General exam: awake & alert, no acute distress, frail and chronically ill appearing Respiratory system: CTAB generally diminished, no wheezes, rales or rhonchi, normal respiratory effort. Cardiovascular system: normal S1/S2, RRR Gastrointestinal system: soft, NT, ND  Central nervous system: A&O x 3. no gross focal neurologic deficits, normal speech Extremities:  R lateral hip with foam pressure dressing clean dry intact Skin: dry, intact, normal temperature Psychiatry: anxious mood, congruent affect, judgement and insight appear  normal   Data Reviewed:  Notable labs ---- CBC with Hbg 7.9 >> 9.5, WBC 11.0 from 8.7.  CK normal 161  Family Communication: husband at bedside on rounds 2/25, 2/27  Disposition: Status is: Inpatient Remains inpatient appropriate because: on IV antibiotics and ongoing evaluation of osteomyelitis  Planned Discharge Destination: Home    Time spent: 38 minutes  Author: Ezekiel Slocumb, DO 01/08/2023 4:11 PM  For on call review [email or web address].

## 2023-01-08 NOTE — Consult Note (Signed)
WOC Nurse Consult Note: Reconsulted by Bedside RN for sacral and right trochanter wounds, see notes from this writer's encounter on 2/25 and Dr. Corlis Leak Encounter on 2/26. I will transcribe his recommended topical care as Orders for Nursing.   I agree with Dr. Dahlia Byes that this patient has a low likelihood of healing her pressure injuries. Mattress with low air loss feature and Prevalon boots were ordered on 2/25.  I recommended consultation with Podiatry for foot wounds on 2/25. If you agree, please consider consult.  [place] nursing team will not follow, but will remain available to this patient, the nursing and medical teams.  Please re-consult if needed.  Thank you for inviting [place] to participate in this patient's Plan of Care.  Maudie Flakes, MSN, RN, CNS, [place], Serita Grammes, [employer], [employer] phone:  [phone]

## 2023-01-08 NOTE — Progress Notes (Signed)
Patient ID: [name], female   DOB: [birth year], 82 y.o.   MRN: [id number]    Progress Note from the Palliative Medicine Team at [facility]   Patient Name: [name]        Date: 01/08/2023 DOB: January 07, 1941  Age: 82 y.o. MRN#: [id number] Attending Physician: Ezekiel Slocumb, DO Primary Care Physician: Tracie Harrier, MD Admit Date: 01/05/2023   Medical records reviewed, assessed patient at bedside, discussed with Dr. Arbutus Ped    82 y.o. female  admitted on 01/05/2023 with PMH significant for    chronic wounds, gout, hypertension, hyperlipidemia, iron deficiency anemia, hypothyroid, non-insulin-dependent diabetes mellitus, who presented to the ED on evening of 01/05/23 for evaluation of worsening right hip wound.     Patient's wound care nurse reportedly concerned of progressive signs of infection over prior 2-3 days. Reported significant pain related to the hip and sacral wounds.   Admitted to medicine service with general surgery consulted for debridement.    On IV Vancomycin and Cefepime for empiric broad spectrum coverage.   Overall failure to thrive, long term poor prognosis.  Poor oral intake   Patient and family face treatment option decisions, advanced directive decisions and anticipatory care needs.    This NP assessed patient at the bedside as a follow up to  yesterday's [place], and to meet with husband and son as scheduled for continued conversation regarding goals of care and treatment plan  Ongoing education regarding patient's current medical situation specific to her underlying co-morbidities; severe arthritis, malnutrition, heart failure,Diabetes.   We spoke in detail regarding her significant wounds and overall failure of skin.  Education offered on adult failure to thrive and the limitations of medical interventions to prolong life when the body fails to thrive.  Education offered and discussion around human mortality.  Detailed education  regarding the difference between a full medical intervention path and a palliative comfort path for this patient, at this time in this situation.  Education offered on hospice benefit; philosophy and eligibility.   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  I attempted to dismiss myths regarding hospice.  He was open to conversation with hospice liaison today.  I spoke with Jhonnie Garner RN and she will talk with family regarding hospice benefit.  Plan of care -DNR/DNI-      Long discussion and education regarding CODE STATUS and today patient's husband and supports DNR/DNI and son agrees. I will place order -Continue all other medical interventions family is hopeful for improvement -Ongoing discussions supporting this family as they navigate the difficult decisions   MOST form introduced   Patient is high risk for continued de- compensation and is likely approaching end-of-life.   Education offered today regarding  the importance of continued conversation with family and their  medical providers regarding overall plan of care and treatment options,  ensuring decisions are within the context of the patients values and GOCs.  Questions and concerns addressed   Discussed with Dr Arbutus Ped   PMT will continue to support holistically   Time: 75 minutes  Detailed review of medical records ( labs, imaging, vital signs), medically appropriate exam ( MS, skin, resp)   discussed with treatment team, counseling and education to patient, family, staff, documenting clinical information, medication management, coordination of care    Wadie Lessen NP  Palliative Medicine Team Team Phone # [phone] Pager [phone]

## 2023-01-08 NOTE — Assessment & Plan Note (Signed)
Renal function stable. Montior.

## 2023-01-08 NOTE — Progress Notes (Addendum)
Manufacturing engineer [facility]) Hospice hospital liaison note  Per request of PMT met with family for informational session about hospice services at home.   Family appreciative of information and have contact information for liaison should any needs arise.   Thank you for the opportunity to participate in this patient's care.   Jhonnie Garner, BSN, RN Hospice hospital liaison  [phone]

## 2023-01-08 NOTE — TOC Initial Note (Signed)
Transition of Care [facility]) - Initial/Assessment Note    Patient Details  Name: [name] MRN: [id number] Date of Birth: [birth year]  Transition of [facility]) CM/SW Contact:    Laurena Slimmer, RN Phone Number: 01/08/2023, 10:45 AM  Clinical Narrative:                 Case reviewed for needs and disposition. Patient is currently active with [facility].  Per [facility] palliative nurse patient family has spoken with a hospice representative.   Attempt to reach patient's spouse. Line is disconnected.  Attempt to reach patient's son. Unable to leave a message.         Patient Goals and CMS Choice            Expected Discharge Plan and Services                                              Prior Living Arrangements/Services                       Activities of Daily Living      Permission Sought/Granted                  Emotional Assessment              Admission diagnosis:  Wound infection [T14.8XXA, L08.9] Patient Active Problem List   Diagnosis Date Noted   Decubitus ulcer of trochanter, right, stage III ([place]) 01/06/2023   Wound infection 01/05/2023   Severe sepsis ([place]) 01/05/2023   Anemia of chronic disease 01/05/2023   Fever 12/21/2022   Leukocytosis 12/20/2022   NSTEMI (non-ST elevated myocardial infarction) ([place]) 12/19/2022   Lower urinary tract infectious disease 12/19/2022   Type II diabetes mellitus with renal manifestations ([place]) 12/19/2022   Chronic kidney disease, stage 3a ([place]) 12/19/2022   DVT (deep venous thrombosis) ([place]) 12/19/2022   Ulcer of both feet, limited to breakdown of skin ([place]) 12/19/2022   Reactive thrombocytosis 11/04/2022   Acute deep vein thrombosis (DVT) of femoral vein of left lower extremity ([place]) 11/02/2022   Chronic ulcer of great toe of left foot with fat layer exposed ([place]) 11/02/2022   Anemia due to stage 3a chronic kidney disease ([place]) 11/02/2022   Acute on chronic urinary retention 11/02/2022    Thrombus 11/02/2022   Pressure injury of skin_sacrum and right hip [id number]   Acute metabolic encephalopathy [id number]   Sepsis ([place]) 08/22/2022   Acute diverticulitis 08/22/2022   Hydroureteronephrosis 08/22/2022   Hypokalemia 08/22/2022   Malnutrition of moderate degree 08/18/2022   Cellulitis of lower extremity 08/17/2022   Cellulitis of both lower extremities 08/16/2022   Diabetes mellitus without complication (HCC)    Gout    Chronic diastolic CHF (congestive heart failure) ([place])    Iron deficiency anemia    Diarrhea    Cervical spondylosis 03/09/2020   Lower limb ulcer, ankle, right, limited to breakdown of skin ([place]) 09/09/2018   Chronic gouty arthritis 04/29/2018   Generalized osteoarthritis of hand 02/12/2018   Hyperuricemia 02/12/2018   Swelling of finger 02/12/2018   PAD (peripheral artery disease) ([place]) 04/30/2017   Lymphedema 04/30/2017   Bilateral edema of lower extremity 04/03/2017   Ecchymosis 04/03/2017   Leg pain, bilateral 04/03/2017   Acquired hypothyroidism 01/24/2017   Benign essential hypertension 01/24/2017   History of mastectomy, right 01/24/2017  Primary osteoarthritis of left knee 02/29/2016   Cancer of right breast ([place]) 04/17/2015   Chronic pain 03/03/2015   Chronic pain of both knees 03/03/2015   Arthritis 01/11/2014   Soft tissue lesion of shoulder region 01/11/2014   Diabetes mellitus, type 2 ([place]) 01/11/2014   H/O renal calculi 01/11/2014   Hypertension 01/11/2014   HLD (hyperlipidemia) 01/11/2014   Neuropathy 01/11/2014   OP (osteoporosis) 01/11/2014   H/O malignant neoplasm of breast 01/11/2014   PCP:  Tracie Harrier, MD Pharmacy:   [place], [place] - [address] [place] [address] Phone: [phone] Fax: [phone]  [facility] [place], [place] - [facility] [address] AT [address] [facility] [place] [address] Phone: [phone]  Fax: [phone]  [facility] [id number] [place], [place] - [address] [place] [address] Phone: [phone] Fax: [phone]     Social Determinants of Health (SDOH) Social History: SDOH Screenings   Food Insecurity: No Food Insecurity (12/20/2022)  Housing: Low Risk  (12/20/2022)  Transportation Needs: No Transportation Needs (12/20/2022)  Utilities: Not At Risk (12/20/2022)  Depression (PHQ2-9): Low Risk  (01/29/2019)  Tobacco Use: Low Risk  (12/20/2022)   SDOH Interventions:     Readmission Risk Interventions    12/24/2022   11:39 AM 11/07/2022    4:05 PM 08/24/2022   10:41 AM  Readmission Risk Prevention Plan  Transportation Screening Complete Complete Complete  PCP or Specialist Appt within 3-5 Days   Complete  HRI or [place]   Not Complete  HRI or Home Care Consult comments   SW will arrange this admission  Social Work Consult for [place] Planning/Counseling   Complete  Palliative Care Screening   Not Applicable  Medication Review Press photographer) Complete Complete Complete  HRI or [place] Not Applicable Complete

## 2023-01-09 DIAGNOSIS — E440 Moderate protein-calorie malnutrition: Secondary | ICD-10-CM | POA: Diagnosis not present

## 2023-01-09 DIAGNOSIS — R627 Adult failure to thrive: Secondary | ICD-10-CM | POA: Diagnosis not present

## 2023-01-09 DIAGNOSIS — I5032 Chronic diastolic (congestive) heart failure: Secondary | ICD-10-CM

## 2023-01-09 DIAGNOSIS — T148XXA Other injury of unspecified body region, initial encounter: Secondary | ICD-10-CM | POA: Diagnosis not present

## 2023-01-09 DIAGNOSIS — N182 Chronic kidney disease, stage 2 (mild): Secondary | ICD-10-CM | POA: Diagnosis not present

## 2023-01-09 DIAGNOSIS — A419 Sepsis, unspecified organism: Secondary | ICD-10-CM | POA: Diagnosis not present

## 2023-01-09 DIAGNOSIS — L89213 Pressure ulcer of right hip, stage 3: Secondary | ICD-10-CM | POA: Diagnosis not present

## 2023-01-09 DIAGNOSIS — R6520 Severe sepsis without septic shock: Secondary | ICD-10-CM | POA: Diagnosis not present

## 2023-01-09 LAB — GLUCOSE, CAPILLARY
Glucose-Capillary: 70 mg/dL (ref 70–99)
Glucose-Capillary: 106 mg/dL — ABNORMAL HIGH (ref 70–99)
Glucose-Capillary: 84 mg/dL (ref 70–99)
Glucose-Capillary: 90 mg/dL (ref 70–99)

## 2023-01-09 LAB — CBC
HCT: 29.2 % — ABNORMAL LOW (ref 36.0–46.0)
Hemoglobin: 8.8 g/dL — ABNORMAL LOW (ref 12.0–15.0)
MCH: 24.6 pg — ABNORMAL LOW (ref 26.0–34.0)
MCHC: 30.1 g/dL (ref 30.0–36.0)
MCV: 81.6 fL (ref 80.0–100.0)
Platelets: 290 10*3/uL (ref 150–400)
RBC: 3.58 MIL/uL — ABNORMAL LOW (ref 3.87–5.11)
RDW: 18.8 % — ABNORMAL HIGH (ref 11.5–15.5)
WBC: 8.4 10*3/uL (ref 4.0–10.5)
nRBC: 0 % (ref 0.0–0.2)

## 2023-01-09 LAB — BASIC METABOLIC PANEL
Anion gap: 9 (ref 5–15)
BUN: 19 mg/dL (ref 8–23)
CO2: 21 mmol/L — ABNORMAL LOW (ref 22–32)
Calcium: 8.1 mg/dL — ABNORMAL LOW (ref 8.9–10.3)
Chloride: 108 mmol/L (ref 98–111)
Creatinine, Ser: 0.8 mg/dL (ref 0.44–1.00)
GFR, Estimated: >60 mL/min (ref >60)
Glucose, Bld: 68 mg/dL — ABNORMAL LOW (ref 70–99)
Potassium: 3.5 mmol/L (ref 3.5–5.1)
Sodium: 138 mmol/L (ref 135–145)

## 2023-01-09 MED ORDER — ENSURE ENLIVE PO LIQD
237.0000 mL | Freq: Three times a day (TID) | ORAL | Status: DC
  Administered 2023-01-09 – 2023-01-15 (×17): 237 mL via ORAL

## 2023-01-09 NOTE — Progress Notes (Signed)
Patient ID: ANYKA STIKA, female   DOB: Apr 05, 1941, 82 y.o.   MRN: [id number]    Progress Note from the Palliative Medicine Team at [facility]   Patient Name: [name]        Date: 01/09/2023 DOB: Jan 01, 1941  Age: 82 y.o. MRN#: [id number] Attending Physician: Jennye Boroughs, MD Primary Care Physician: Tracie Harrier, MD Admit Date: 01/05/2023   Medical records reviewed, assessed patient at bedside, discussed with Dr. Arbutus Ped    82 y.o. female  admitted on 01/05/2023 with PMH significant for    chronic wounds, gout, hypertension, hyperlipidemia, iron deficiency anemia, hypothyroid, non-insulin-dependent diabetes mellitus, who presented to the ED on evening of 01/05/23 for evaluation of worsening right hip wound.     Patient's wound care nurse reportedly concerned of progressive signs of infection over prior 2-3 days. Reported significant pain related to the hip and sacral wounds.   Admitted to medicine service with general surgery consulted for debridement.    On IV Vancomycin and Cefepime for empiric broad spectrum coverage.   Overall failure to thrive, long term poor prognosis.  Poor oral intake   Patient and family face treatment option decisions, advanced directive decisions and anticipatory care needs.    This NP assessed patient at the bedside as a follow up for palliative medicine needs and emotional support.husband at bedside.    Patient is more alert today but only oriented to self. She is eating breakfast with husband support.    Follow up  education regarding patient's current medical situation specific to her underlying co-morbidities; severe arthritis, malnutrition, heart failure,  diabetes.     adult failure to thrive and the limitations of medical interventions to prolong life when the body fails to thrive.  Husband understands, he has no questions from yesterday's meeting.    Plan of care -DNR/DNI-     -Continue all other medical interventions  family is hopeful for improvement -Ongoing discussions supporting this family as they navigate the difficult decisions -disposition dependant on outcomes    Patient is high risk for continued de- compensation   Education offered today regarding  the importance of continued conversation with family and their  medical providers regarding overall plan of care and treatment options,  ensuring decisions are within the context of the patients values and GOCs.  Questions and concerns addressed     PMT will continue to support holistically   Time: 25 minutes  Detailed review of medical records ( labs,  vital signs), medically appropriate exam ( MS, skin, resp)   discussed with treatment team, counseling and education to patient, family, staff, documenting clinical information, medication management, coordination of care    Wadie Lessen NP  Palliative Medicine Team Team Phone # [phone] Pager [phone]

## 2023-01-09 NOTE — Progress Notes (Addendum)
Nutrition Follow-up  DOCUMENTATION CODES:   Non-severe (moderate) malnutrition in context of chronic illness  INTERVENTION:   -Liberalize diet to regular for widest variety of meal selections -Continue MVI with minerals daily -Continue Magic cup TID with meals, each supplement provides 290 kcal and 9 grams of protein  -Continue 500 mg vitamin C BID -Continue 220 mg zinc sulfate daily x 14 days -Feeding assistance with meals  NUTRITION DIAGNOSIS:   Moderate Malnutrition related to chronic illness (DM, chronic wounds) as evidenced by mild fat depletion, moderate fat depletion, mild muscle depletion, moderate muscle depletion.  Ongoing  GOAL:   Patient will meet greater than or equal to 90% of their needs  Progressing   MONITOR:   PO intake, Supplement acceptance  REASON FOR ASSESSMENT:   Consult Assessment of nutrition requirement/status  ASSESSMENT:   Pt with history of chronic wounds, gout, hypertension, hyperlipidemia, iron deficiency anemia, acquired hypothyroid, non-insulin-dependent diabetes mellitus, who presented to the ED on evening of 01/05/23 for evaluation of worsening right hip wound.  Patient's wound nurse reportedly concerned of progressive signs of infection over prior 2-3 days.  Patient reports significant pain related to the hip and sacral wounds.  2/25- rt hip wound debrided at bedside per general surgery  Reviewed I/O's: -1 L x 24 hours and -3.2 L since admission  UOP: 1.1 L x 24 hours   Pt lying in bed at time of visit. She did not respond to voice or touch. No family at bedside to provide additional history.   Pt is currently on a carb modified diet. Noted meal completions 50%.   Reviewed wt hx; wt has been stable over the past 3 months. Noted pt with edema, which may be masking further weight loss as well as fat and muscle depletions.   Palliative care following for goals of care; pt family desire to continue all medical interventions in hopes  for improvement. Pt is a DNR/ DNI.   Medications reviewed and include vitamin B-12, diflucan, and zosyn.   Pt with malnutrition and would benefit from nutrient dense supplement. One Ensure Enlive supplement provides 350 kcals, 20 grams protein, and 44-45 grams of carbohydrate vs one Glucerna shake supplement, which provides 220 kcals, 10 grams of protein, and 26 grams of carbohydrate. Given pt's hx of DM, RD will reassess adequacy of PO intake, CBGS, and adjust supplement regimen as appropriate at follow-up.    Labs reviewed: CBGS: 70-98 (inpatient orders for glycemic control are 0-5 units insulin aspart daily at bedtime and 0-9 units insulin aspart TID with meals).    NUTRITION - FOCUSED PHYSICAL EXAM:  Flowsheet Row Most Recent Value  Orbital Region Moderate depletion  Upper Arm Region Moderate depletion  Thoracic and Lumbar Region Mild depletion  Buccal Region Mild depletion  Temple Region Moderate depletion  Clavicle Bone Region Moderate depletion  Clavicle and Acromion Bone Region Moderate depletion  Scapular Bone Region Moderate depletion  Dorsal Hand Moderate depletion  Patellar Region Moderate depletion  Anterior Thigh Region Moderate depletion  Posterior Calf Region Moderate depletion  Edema (RD Assessment) Mild  Hair Reviewed  Eyes Reviewed  Mouth Reviewed  Skin Reviewed  Nails Reviewed       Diet Order:   Diet Order             Diet Carb Modified Fluid consistency: Thin  Diet effective now                   EDUCATION NEEDS:   Not appropriate  for education at this time  Skin:  Skin Assessment: Skin Integrity Issues: Skin Integrity Issues:: Stage IV, Unstageable, Stage II, Stage III, Other (Comment) Stage II: lt foot Stage III: rt heel Stage IV: sacrum Unstageable: lt heel Other: venous stasis ulcer to rt heel with eschar, rt hip pressure injury  Last BM:  01/09/23 (type 5)  Height:   Ht Readings from Last 1 Encounters:  01/05/23 '4\' 11"'$  (1.499 m)     Weight:   Wt Readings from Last 1 Encounters:  01/05/23 52.6 kg    Ideal Body Weight:  44.7 kg  BMI:  Body mass index is 23.43 kg/m.  Estimated Nutritional Needs:   Kcal:  1800-2000  Protein:  105-120 grams  Fluid:  > 1.8 L    Loistine Chance, RD, LDN, [place] Registered Dietitian II Certified Diabetes Care and Education Specialist Please refer to [facility] for RD and/or RD on-call/weekend/after hours pager

## 2023-01-09 NOTE — Progress Notes (Signed)
Progress Note    [name]  [id number] DOB: Oct 22, 1941  DOA: 01/05/2023 PCP: Tracie Harrier, MD      Brief Narrative:    Medical records reviewed and are as summarized below:   [name] is a 82 year old female with history of chronic wounds, gout, hypertension, hyperlipidemia, iron deficiency anemia, acquired hypothyroid, non-insulin-dependent diabetes mellitus, who presented to the ED on evening of 01/05/23 for evaluation of worsening right hip wound.  Patient's wound nurse reportedly concerned of progressive signs of infection over prior 2-3 days.  Patient reports significant pain related to the hip and sacral wounds.  Admitted to medicine service with general surgery consulted for debridement.    Started on empiric IV Vancomycin and Cefepime for empiric broad spectrum coverage.  Infectious disease consulted.  Antibiotics were changed to IV Daptomycin and Zosyn.  Palliative Care consulted for goals of care discussions.  Patient is debilitated, malnourished, chronically ill.  She has poor long term prognosis and is high risk for morbidity and mortality.        Assessment/Plan:   Principal Problem:   Severe sepsis ([place]) Active Problems:   Cancer of right breast (HCC)   Wound infection   Chronic diastolic CHF (congestive heart failure) (HCC)   Anemia of chronic disease   Chronic ulcer of great toe of left foot with fat layer exposed (HCC)   Benign essential hypertension   HLD (hyperlipidemia)   Acquired hypothyroidism   CKD (chronic kidney disease) stage 2, GFR 60-89 ml/min   Ulcer of both feet, limited to breakdown of skin ([place])   Diabetes mellitus, type 2 (HCC)   PAD (peripheral artery disease) (HCC)   Lymphedema   History of mastectomy, right   Malnutrition of moderate degree   Leukocytosis   Decubitus ulcer of trochanter, right, stage III ([place])   Nutrition Problem: Moderate Malnutrition Etiology: chronic illness (DM, chronic  wounds)  Signs/Symptoms: mild fat depletion, moderate fat depletion, mild muscle depletion, moderate muscle depletion   Body mass index is 23.43 kg/m.   Severe sepsis, osteomyelitis of sacrum and right greater trochanter: S/p debridement of right trochanteric decubitus ulcer on 01/06/2023.  She is on IV daptomycin and Zosyn.  Analgesics as needed for pain.  Continue local wound care.  Follow-up with ID.   Hypotension: Discontinue lisinopril for now.  Continue atenolol   Chronic left great toe ulcer, bilateral foot wounds: Continue local wound care   Other comorbidities include hypertension, hyperlipidemia, hypothyroidism, CKD stage II, history of right breast cancer s/p mastectomy, chronic lymphedema of lower extremities, type II DM, PVD     Diet Order             Diet regular Room service appropriate? No; Fluid consistency: Thin  Diet effective now                            Consultants: ID specialist  Procedures: None    Medications:    acetaminophen  1,000 mg Oral Q8H   allopurinol  300 mg Oral Daily   vitamin C  500 mg Oral BID   atenolol  50 mg Oral Daily   atorvastatin  40 mg Oral Daily   Chlorhexidine Gluconate Cloth  6 each Topical Daily   cyanocobalamin  1,000 mcg Oral Daily   enoxaparin (LOVENOX) injection  40 mg Subcutaneous Q24H   feeding supplement  237 mL Oral TID BM   fluconazole  100 mg Oral  Daily   insulin aspart  0-5 Units Subcutaneous QHS   insulin aspart  0-9 Units Subcutaneous TID WC   levothyroxine  88 mcg Oral Q0600   multivitamin with minerals  1 tablet Oral Daily   zinc sulfate  220 mg Oral Daily   Continuous Infusions:  DAPTOmycin (CUBICIN) 400 mg in sodium chloride 0.9 % IVPB Stopped (01/09/23 0729)   piperacillin-tazobactam (ZOSYN)  IV 3.375 g (01/09/23 1346)     Anti-infectives (From admission, onward)    Start     Dose/Rate Route Frequency Ordered Stop   01/09/23 0000  vancomycin (VANCOCIN) IVPB 1000 mg/200 mL  premix  Status:  Discontinued        1,000 mg 200 mL/hr over 60 Minutes Intravenous Every 36 hours 01/07/23 1308 01/07/23 1557   01/08/23 2000  DAPTOmycin (CUBICIN) 400 mg in sodium chloride 0.9 % IVPB        8 mg/kg  52.6 kg 116 mL/hr over 30 Minutes Intravenous Daily 01/07/23 1617     01/08/23 0600  piperacillin-tazobactam (ZOSYN) IVPB 3.375 g        3.375 g 12.5 mL/hr over 240 Minutes Intravenous Every 8 hours 01/07/23 1617     01/07/23 2200  ceFEPIme (MAXIPIME) 2 g in sodium chloride 0.9 % 100 mL IVPB        2 g 200 mL/hr over 30 Minutes Intravenous  Once 01/07/23 1617 01/07/23 2300   01/07/23 1800  vancomycin (VANCOREADY) IVPB 1250 mg/250 mL  Status:  Discontinued        1,250 mg 166.7 mL/hr over 90 Minutes Intravenous Every 48 hours 01/05/23 1708 01/07/23 1120   01/07/23 1215  vancomycin (VANCOREADY) IVPB 1250 mg/250 mL        1,250 mg 166.7 mL/hr over 90 Minutes Intravenous  Once 01/07/23 1125 01/07/23 1530   01/06/23 1000  fluconazole (DIFLUCAN) tablet 100 mg        100 mg Oral Daily 01/05/23 1652 01/11/23 2359   01/05/23 2200  ceFEPIme (MAXIPIME) 2 g in sodium chloride 0.9 % 100 mL IVPB  Status:  Discontinued        2 g 200 mL/hr over 30 Minutes Intravenous Every 12 hours 01/05/23 1708 01/07/23 1557   01/05/23 1645  Ampicillin-Sulbactam (UNASYN) 3 g in sodium chloride 0.9 % 100 mL IVPB        3 g 200 mL/hr over 30 Minutes Intravenous  Once 01/05/23 1630 01/05/23 1721   01/05/23 1645  vancomycin (VANCOCIN) IVPB 1000 mg/200 mL premix  Status:  Discontinued        1,000 mg 200 mL/hr over 60 Minutes Intravenous  Once 01/05/23 1634 01/07/23 1120              Family Communication/Anticipated D/C date and plan/Code Status   DVT prophylaxis: enoxaparin (LOVENOX) injection 40 mg Start: 01/05/23 2200 Place TED hose Start: 01/05/23 1656     Code Status: DNR  Family Communication: None Disposition Plan: Possible discharge to hospice   Status is: Inpatient Remains  inpatient appropriate because: Sacral and right greater trochanter osteomyelitis        Subjective:   Interval events noted.  She is unable to provide any history.  Objective:    Vitals:   01/09/23 1141 01/09/23 1152 01/09/23 1407 01/09/23 1440  BP: (!) 88/45 (!) 95/46 (!) 109/56 114/71  Pulse: 68 69 66 71  Resp: '16  18 18  '$ Temp: 98.6 F (37 C)  98.2 F (36.8 C) 98 F (36.7 C)  TempSrc:   Oral   SpO2: 100%  100% 100%  Weight:      Height:       No data found.   Intake/Output Summary (Last 24 hours) at 01/09/2023 1703 Last data filed at 01/09/2023 1000 Gross per 24 hour  Intake 326.01 ml  Output 350 ml  Net -23.99 ml   Filed Weights   01/05/23 1511  Weight: 52.6 kg    Exam:  GEN: NAD SKIN: Warm and dry EYES: EOMI ENT: MMM CV: RRR PULM: CTA B ABD: soft, ND, NT, +BS CNS: AAO x 2, non focal EXT: No edema or tenderness    Pressure Injury 08/24/22 Sacrum Mid Stage 4 - Full thickness tissue loss with exposed bone, tendon or muscle. (Active)  08/24/22 1231  Location: Sacrum  Location Orientation: Mid  Staging: Stage 4 - Full thickness tissue loss with exposed bone, tendon or muscle.  Wound Description (Comments):   Present on Admission: Yes  Dressing Type Foam - Lift dressing to assess site every shift 01/09/23 0830     Pressure Injury 10/09/22 Heel Right Stage 3 -  Full thickness tissue loss. Subcutaneous fat may be visible but bone, tendon or muscle are NOT exposed. (Active)  10/09/22 0139  Location: Heel  Location Orientation: Right  Staging: Stage 3 -  Full thickness tissue loss. Subcutaneous fat may be visible but bone, tendon or muscle are NOT exposed.  Wound Description (Comments):   Present on Admission: Yes  Dressing Type Foam - Lift dressing to assess site every shift 01/09/23 0830     Pressure Injury Heel Left Unstageable - Full thickness tissue loss in which the base of the injury is covered by slough (yellow, tan, gray, green or brown)  and/or eschar (tan, brown or black) in the wound bed. (Active)     Location: Heel  Location Orientation: Left  Staging: Unstageable - Full thickness tissue loss in which the base of the injury is covered by slough (yellow, tan, gray, green or brown) and/or eschar (tan, brown or black) in the wound bed.  Wound Description (Comments):   Present on Admission:   Dressing Type Foam - Lift dressing to assess site every shift 01/09/23 0830     Data Reviewed:   I have personally reviewed following labs and imaging studies:  Labs: Labs show the following:   Basic Metabolic Panel: Recent Labs  Lab 01/05/23 1529 01/06/23 0416 01/07/23 0621 01/09/23 0302  NA 136 137 136 138  K 3.5 3.7 3.9 3.5  CL 103 105 107 108  CO2 20* 21* 24 21*  GLUCOSE 89 88 104* 68*  BUN '11 10 22 19  '$ CREATININE 0.90 0.94 0.89 0.80  CALCIUM 8.5* 8.1* 7.8* 8.1*  MG  --   --  2.0  --   PHOS  --   --  2.7  --    GFR Estimated Creatinine Clearance: 42.3 mL/min (by C-G formula based on SCr of 0.8 mg/dL). Liver Function Tests: Recent Labs  Lab 01/05/23 1529  AST 21  ALT 8  ALKPHOS 50  BILITOT 0.6  PROT 6.8  ALBUMIN 2.5*   No results for input(s): "LIPASE", "AMYLASE" in the last 168 hours. No results for input(s): "AMMONIA" in the last 168 hours. Coagulation profile Recent Labs  Lab 01/06/23 0416  INR 1.5*    CBC: Recent Labs  Lab 01/05/23 1529 01/06/23 0416 01/07/23 0621 01/08/23 0628 01/09/23 0302  WBC 11.8* 7.6 8.7 11.0* 8.4  NEUTROABS 10.1*  --   --   --   --  HGB 9.3* 8.7* 7.9* 9.5* 8.8*  HCT 30.5* 28.0* 26.6* 32.2* 29.2*  MCV 81.1 78.9* 81.6 82.6 81.6  PLT 398 372 279 333 290   Cardiac Enzymes: Recent Labs  Lab 01/08/23 0628  CKTOTAL 161   BNP (last 3 results) No results for input(s): "PROBNP" in the last 8760 hours. CBG: Recent Labs  Lab 01/08/23 0737 01/08/23 1144 01/08/23 1618 01/09/23 0746 01/09/23 1142  GLUCAP 94 98 77 70 106*   D-Dimer: No results for  input(s): "DDIMER" in the last 72 hours. Hgb A1c: No results for input(s): "HGBA1C" in the last 72 hours. Lipid Profile: No results for input(s): "CHOL", "HDL", "LDLCALC", "TRIG", "CHOLHDL", "LDLDIRECT" in the last 72 hours. Thyroid function studies: No results for input(s): "TSH", "T4TOTAL", "T3FREE", "THYROIDAB" in the last 72 hours.  Invalid input(s): "FREET3" Anemia work up: No results for input(s): "VITAMINB12", "FOLATE", "FERRITIN", "TIBC", "IRON", "RETICCTPCT" in the last 72 hours. Sepsis Labs: Recent Labs  Lab 01/05/23 1529 01/05/23 2044 01/06/23 0416 01/06/23 0917 01/07/23 0621 01/08/23 0628 01/09/23 0302  PROCALCITON <0.10  --   --   --   --   --   --   WBC 11.8*  --  7.6  --  8.7 11.0* 8.4  LATICACIDVEN 3.1* 2.3*  --  1.5  --   --   --     Microbiology Recent Results (from the past 240 hour(s))  Blood culture (routine x 2)     Status: None (Preliminary result)   Collection Time: 01/05/23  3:29 PM   Specimen: BLOOD  Result Value Ref Range Status   Specimen Description BLOOD RIGHT ANTECUBITAL  Final   Special Requests   Final    BOTTLES DRAWN AEROBIC AND ANAEROBIC Blood Culture adequate volume   Culture   Final    NO GROWTH 4 DAYS Performed at [facility], [address] [place], [place] [address]    Report Status PENDING  Incomplete  Blood culture (routine x 2)     Status: None (Preliminary result)   Collection Time: 01/05/23  4:47 PM   Specimen: BLOOD  Result Value Ref Range Status   Specimen Description BLOOD BLOOD RIGHT HAND  Final   Special Requests   Final    BOTTLES DRAWN AEROBIC AND ANAEROBIC Blood Culture adequate volume   Culture   Final    NO GROWTH 4 DAYS Performed at [facility], [address] [place], [place] [address]    Report Status PENDING  Incomplete  MRSA Next Gen by PCR, Nasal     Status: None   Collection Time: 01/06/23 11:31 AM   Specimen: Nasal Mucosa; Nasal Swab  Result Value Ref Range Status   MRSA by  PCR Next Gen NOT DETECTED NOT DETECTED Final    Comment: (NOTE) The GeneXpert MRSA Assay (FDA approved for NASAL specimens only), is one component of a comprehensive MRSA colonization surveillance program. It is not intended to diagnose MRSA infection nor to guide or monitor treatment for MRSA infections. Test performance is not FDA approved in patients less than 53 years old. Performed at [facility], [address] [place],  [address]     Procedures and diagnostic studies:  No results found.             LOS: 4 days   Mariselda Badalamenti  [facility]   Pager on [email or web address]. If 7PM-7AM, please contact night-coverage at [email or web address]     01/09/2023, 5:03 PM

## 2023-01-09 NOTE — Progress Notes (Signed)
Report called to [place].

## 2023-01-10 DIAGNOSIS — L8990 Pressure ulcer of unspecified site, unspecified stage: Secondary | ICD-10-CM | POA: Diagnosis not present

## 2023-01-10 DIAGNOSIS — E8809 Other disorders of plasma-protein metabolism, not elsewhere classified: Secondary | ICD-10-CM | POA: Diagnosis not present

## 2023-01-10 DIAGNOSIS — R6520 Severe sepsis without septic shock: Secondary | ICD-10-CM | POA: Diagnosis not present

## 2023-01-10 DIAGNOSIS — L89213 Pressure ulcer of right hip, stage 3: Secondary | ICD-10-CM | POA: Diagnosis not present

## 2023-01-10 DIAGNOSIS — A419 Sepsis, unspecified organism: Secondary | ICD-10-CM | POA: Diagnosis not present

## 2023-01-10 DIAGNOSIS — D638 Anemia in other chronic diseases classified elsewhere: Secondary | ICD-10-CM | POA: Diagnosis not present

## 2023-01-10 LAB — CULTURE, BLOOD (ROUTINE X 2)
Culture: NO GROWTH
Culture: NO GROWTH
Special Requests: ADEQUATE
Special Requests: ADEQUATE

## 2023-01-10 LAB — GLUCOSE, CAPILLARY
Glucose-Capillary: 117 mg/dL — ABNORMAL HIGH (ref 70–99)
Glucose-Capillary: 127 mg/dL — ABNORMAL HIGH (ref 70–99)

## 2023-01-10 NOTE — Consult Note (Signed)
Pharmacy Antibiotic Note  [name] is a 82 y.o. afebrile female admitted on 01/05/2023 with osteomyelitis and wound infections on the right hip, sacrum, heel and, foot . Her WBC and lactic acid trend downwards to normal values, while her procalcitonin value was not significant. MRI revealed osteomyelitis of the sacral bone and greater trochanter. Patient had a past wound culture on 12/19/22 with proteus mirabilis, MRSA, enterococcus faecalis, and rare prevotella bivia. Blood culture on 12/19/22 had growing culture of enterococcus faecalis. She was discharged 12/24/22 on linezolid and amoxicillin/clavulanate. She would have completed these antibiotics 12/31/22. Pharmacy has been consulted for daptomycin and piperacillin/tazobactam dosing. ID following  Today, 01/10/2023 Day #5 Cefepime to pip/tazo, D#4 vancomycin to daptomycin (Vanc dose not given 2/25) Renal: SCr WNL WBC WNL Afebrile 2/27 CK = 161  Plan: Continue piperacillin/tazobactam 3.375gm IV q8h over 4h infusion  Continue Daptomycin '400mg'$  ('8mg'$ /kg) IV q24h starting 2/27 8p Weekly CK Monitor renal function and cultures Follow-up long-term plans for antibiotics   Height: '4\' 11"'$  (149.9 cm) Weight: 52.6 kg (116 lb) IBW/kg (Calculated) : 43.2  Temp (24hrs), Avg:98.1 F (36.7 C), Min:97.6 F (36.4 C), Max:98.6 F (37 C)  Recent Labs  Lab 01/05/23 1529 01/05/23 2044 01/06/23 0416 01/06/23 0917 01/07/23 0621 01/08/23 0628 01/09/23 0302  WBC 11.8*  --  7.6  --  8.7 11.0* 8.4  CREATININE 0.90  --  0.94  --  0.89  --  0.80  LATICACIDVEN 3.1* 2.3*  --  1.5  --   --   --      Estimated Creatinine Clearance: 42.3 mL/min (by C-G formula based on SCr of 0.8 mg/dL).    Allergies  Allergen Reactions   Other Anaphylaxis    Anesthisia has been a health issue for her in the past.    Sulfa Antibiotics Rash    Antimicrobials this admission: 2/27 >>Daptomycin >> 2/27 pip/tazo >> 2/26 vancomycin >> 2/27 2/24 cefepime >>  2/27 2/24 unasyn >> 2/24  Microbiology results: 2/24 BCx: NGTD 2/25 MRSA PCR: not detected  Thank you for allowing pharmacy to be a part of this patient's care.  Doreene Eland, PharmD, BCPS, BCIDP Work Cell: [phone] 01/10/2023 11:26 AM

## 2023-01-10 NOTE — Progress Notes (Addendum)
Progress Note    [name]  [id number] DOB: 01/30/1941  DOA: 01/05/2023 PCP: Tracie Harrier, MD      Brief Narrative:    Medical records reviewed and are as summarized below:   [name] is a 82 year old female with history of chronic wounds, gout, hypertension, hyperlipidemia, iron deficiency anemia, acquired hypothyroid, non-insulin-dependent diabetes mellitus, who presented to the ED on evening of 01/05/23 for evaluation of worsening right hip wound.  Patient's wound nurse reportedly concerned of progressive signs of infection over prior 2-3 days.  Patient reports significant pain related to the hip and sacral wounds.  Admitted to medicine service with general surgery consulted for debridement.    Started on empiric IV Vancomycin and Cefepime for empiric broad spectrum coverage.  Infectious disease consulted.  Antibiotics were changed to IV Daptomycin and Zosyn.  Palliative Care consulted for goals of care discussions.  Patient is debilitated, malnourished, chronically ill.  She has poor long term prognosis and is high risk for morbidity and mortality.        Assessment/Plan:   Principal Problem:   Severe sepsis ([place]) Active Problems:   Cancer of right breast (HCC)   Wound infection   Chronic diastolic CHF (congestive heart failure) (HCC)   Anemia of chronic disease   Chronic ulcer of great toe of left foot with fat layer exposed (HCC)   Benign essential hypertension   HLD (hyperlipidemia)   Acquired hypothyroidism   CKD (chronic kidney disease) stage 2, GFR 60-89 ml/min   Ulcer of both feet, limited to breakdown of skin ([place])   Diabetes mellitus, type 2 (HCC)   PAD (peripheral artery disease) (HCC)   Lymphedema   History of mastectomy, right   Malnutrition of moderate degree   Leukocytosis   Decubitus ulcer of trochanter, right, stage III (HCC)   Hypoalbuminemia   Nutrition Problem: Moderate Malnutrition Etiology: chronic  illness (DM, chronic wounds)  Signs/Symptoms: mild fat depletion, moderate fat depletion, mild muscle depletion, moderate muscle depletion   Body mass index is 23.43 kg/m.   Severe sepsis, osteomyelitis of sacrum and right greater trochanter: S/p debridement of right trochanteric decubitus ulcer on 01/06/2023.  Continue IV daptomycin and Zosyn.  Follow-up with ID for further recommendations.  Analgesics as needed for pain.  Continue local wound care.  Follow-up with ID.   Hypotension: BP is better.  Lisinopril still on hold.  Continue atenolol   Chronic left great toe ulcer, bilateral foot wounds: Continue local wound care   Other comorbidities include hypoalbuminemia, hypertension, hyperlipidemia, hypothyroidism, CKD stage II, history of right breast cancer s/p mastectomy, chronic lymphedema of lower extremities, type II DM, PVD   I was informed by Colletta [place], case manager, that patient's husband has decided to proceed with hospice services at home.  I called Mr. Arata (husband) to discuss discharge plans with him.  I explained what hospice means and what to expect when patient is discharged on hospice.  I also told him that patient would not be eligible for IV antibiotics at home.  I answered all his questions.  I told him that he can change his mind if he feels he is not ready for hospice.  After answering all his questions, he decided that he was ready for patient to go home with hospice.  He said patient is bedbound and has a hospital bed at home.  He understands that she will be discharged home tomorrow with hospice.  I spent 16 minutes with Mr. Kolacz.  Diet Order             Diet regular Room service appropriate? No; Fluid consistency: Thin  Diet effective now                            Consultants: ID specialist  Procedures: None    Medications:    acetaminophen  1,000 mg Oral Q8H   allopurinol  300 mg Oral Daily   vitamin C  500 mg Oral BID    atenolol  50 mg Oral Daily   atorvastatin  40 mg Oral Daily   Chlorhexidine Gluconate Cloth  6 each Topical Daily   cyanocobalamin  1,000 mcg Oral Daily   enoxaparin (LOVENOX) injection  40 mg Subcutaneous Q24H   feeding supplement  237 mL Oral TID BM   fluconazole  100 mg Oral Daily   levothyroxine  88 mcg Oral Q0600   multivitamin with minerals  1 tablet Oral Daily   zinc sulfate  220 mg Oral Daily   Continuous Infusions:  DAPTOmycin (CUBICIN) 400 mg in sodium chloride 0.9 % IVPB Stopped (01/09/23 2146)   piperacillin-tazobactam (ZOSYN)  IV 12.5 mL/hr at 01/10/23 1527     Anti-infectives (From admission, onward)    Start     Dose/Rate Route Frequency Ordered Stop   01/09/23 0000  vancomycin (VANCOCIN) IVPB 1000 mg/200 mL premix  Status:  Discontinued        1,000 mg 200 mL/hr over 60 Minutes Intravenous Every 36 hours 01/07/23 1308 01/07/23 1557   01/08/23 2000  DAPTOmycin (CUBICIN) 400 mg in sodium chloride 0.9 % IVPB        8 mg/kg  52.6 kg 116 mL/hr over 30 Minutes Intravenous Daily 01/07/23 1617     01/08/23 0600  piperacillin-tazobactam (ZOSYN) IVPB 3.375 g        3.375 g 12.5 mL/hr over 240 Minutes Intravenous Every 8 hours 01/07/23 1617     01/07/23 2200  ceFEPIme (MAXIPIME) 2 g in sodium chloride 0.9 % 100 mL IVPB        2 g 200 mL/hr over 30 Minutes Intravenous  Once 01/07/23 1617 01/07/23 2300   01/07/23 1800  vancomycin (VANCOREADY) IVPB 1250 mg/250 mL  Status:  Discontinued        1,250 mg 166.7 mL/hr over 90 Minutes Intravenous Every 48 hours 01/05/23 1708 01/07/23 1120   01/07/23 1215  vancomycin (VANCOREADY) IVPB 1250 mg/250 mL        1,250 mg 166.7 mL/hr over 90 Minutes Intravenous  Once 01/07/23 1125 01/07/23 1530   01/06/23 1000  fluconazole (DIFLUCAN) tablet 100 mg        100 mg Oral Daily 01/05/23 1652 01/11/23 2359   01/05/23 2200  ceFEPIme (MAXIPIME) 2 g in sodium chloride 0.9 % 100 mL IVPB  Status:  Discontinued        2 g 200 mL/hr over 30  Minutes Intravenous Every 12 hours 01/05/23 1708 01/07/23 1557   01/05/23 1645  Ampicillin-Sulbactam (UNASYN) 3 g in sodium chloride 0.9 % 100 mL IVPB        3 g 200 mL/hr over 30 Minutes Intravenous  Once 01/05/23 1630 01/05/23 1721   01/05/23 1645  vancomycin (VANCOCIN) IVPB 1000 mg/200 mL premix  Status:  Discontinued        1,000 mg 200 mL/hr over 60 Minutes Intravenous  Once 01/05/23 1634 01/07/23 1120  Family Communication/Anticipated D/C date and plan/Code Status   DVT prophylaxis: enoxaparin (LOVENOX) injection 40 mg Start: 01/05/23 2200 Place TED hose Start: 01/05/23 1656     Code Status: DNR  Family Communication: Plan discussed with husband over the phone Disposition Plan: Possible discharge home   Status is: Inpatient Remains inpatient appropriate because: Sacral and right greater trochanter osteomyelitis        Subjective:   Interval events noted. She feels better today. No complaints  Objective:    Vitals:   01/09/23 1944 01/10/23 0453 01/10/23 0755 01/10/23 1550  BP: (!) 111/55 128/69 (!) 141/73 133/62  Pulse: 73 68 65 70  Resp: '18 16 18 18  '$ Temp: 98.6 F (37 C) 97.6 F (36.4 C) 97.7 F (36.5 C) 98.6 F (37 C)  TempSrc:      SpO2: 100% 100% 100% 100%  Weight:      Height:       No data found.   Intake/Output Summary (Last 24 hours) at 01/10/2023 1704 Last data filed at 01/10/2023 1527 Gross per 24 hour  Intake 373.98 ml  Output 2150 ml  Net -1776.02 ml   Filed Weights   01/05/23 1511  Weight: 52.6 kg    Exam:   GEN: NAD SKIN: Warm and dry EYES: EOMI ENT: MMM CV: RRR PULM: CTA B ABD: soft, ND, NT, +BS CNS: AAO x 3, non focal EXT: No edema or tenderness       Data Reviewed:   I have personally reviewed following labs and imaging studies:  Labs: Labs show the following:   Basic Metabolic Panel: Recent Labs  Lab 01/05/23 1529 01/06/23 0416 01/07/23 0621 01/09/23 0302  NA 136 137 136 138  K  3.5 3.7 3.9 3.5  CL 103 105 107 108  CO2 20* 21* 24 21*  GLUCOSE 89 88 104* 68*  BUN '11 10 22 19  '$ CREATININE 0.90 0.94 0.89 0.80  CALCIUM 8.5* 8.1* 7.8* 8.1*  MG  --   --  2.0  --   PHOS  --   --  2.7  --    GFR Estimated Creatinine Clearance: 42.3 mL/min (by C-G formula based on SCr of 0.8 mg/dL). Liver Function Tests: Recent Labs  Lab 01/05/23 1529  AST 21  ALT 8  ALKPHOS 50  BILITOT 0.6  PROT 6.8  ALBUMIN 2.5*   No results for input(s): "LIPASE", "AMYLASE" in the last 168 hours. No results for input(s): "AMMONIA" in the last 168 hours. Coagulation profile Recent Labs  Lab 01/06/23 0416  INR 1.5*    CBC: Recent Labs  Lab 01/05/23 1529 01/06/23 0416 01/07/23 0621 01/08/23 0628 01/09/23 0302  WBC 11.8* 7.6 8.7 11.0* 8.4  NEUTROABS 10.1*  --   --   --   --   HGB 9.3* 8.7* 7.9* 9.5* 8.8*  HCT 30.5* 28.0* 26.6* 32.2* 29.2*  MCV 81.1 78.9* 81.6 82.6 81.6  PLT 398 372 279 333 290   Cardiac Enzymes: Recent Labs  Lab 01/08/23 0628  CKTOTAL 161   BNP (last 3 results) No results for input(s): "PROBNP" in the last 8760 hours. CBG: Recent Labs  Lab 01/09/23 1142 01/09/23 1659 01/09/23 2129 01/10/23 0751 01/10/23 1105  GLUCAP 106* 84 90 117* 127*   D-Dimer: No results for input(s): "DDIMER" in the last 72 hours. Hgb A1c: No results for input(s): "HGBA1C" in the last 72 hours. Lipid Profile: No results for input(s): "CHOL", "HDL", "LDLCALC", "TRIG", "CHOLHDL", "LDLDIRECT" in the last 72 hours. Thyroid  function studies: No results for input(s): "TSH", "T4TOTAL", "T3FREE", "THYROIDAB" in the last 72 hours.  Invalid input(s): "FREET3" Anemia work up: No results for input(s): "VITAMINB12", "FOLATE", "FERRITIN", "TIBC", "IRON", "RETICCTPCT" in the last 72 hours. Sepsis Labs: Recent Labs  Lab 01/05/23 1529 01/05/23 2044 01/06/23 0416 01/06/23 0917 01/07/23 0621 01/08/23 0628 01/09/23 0302  PROCALCITON <0.10  --   --   --   --   --   --   WBC 11.8*   --  7.6  --  8.7 11.0* 8.4  LATICACIDVEN 3.1* 2.3*  --  1.5  --   --   --     Microbiology Recent Results (from the past 240 hour(s))  Blood culture (routine x 2)     Status: None   Collection Time: 01/05/23  3:29 PM   Specimen: BLOOD  Result Value Ref Range Status   Specimen Description BLOOD RIGHT ANTECUBITAL  Final   Special Requests   Final    BOTTLES DRAWN AEROBIC AND ANAEROBIC Blood Culture adequate volume   Culture   Final    NO GROWTH 5 DAYS Performed at [facility], [address] [place], [place] [address]    Report Status 01/10/2023 FINAL  Final  Blood culture (routine x 2)     Status: None   Collection Time: 01/05/23  4:47 PM   Specimen: BLOOD  Result Value Ref Range Status   Specimen Description BLOOD BLOOD RIGHT HAND  Final   Special Requests   Final    BOTTLES DRAWN AEROBIC AND ANAEROBIC Blood Culture adequate volume   Culture   Final    NO GROWTH 5 DAYS Performed at [facility], [address] [place], [place] [address]    Report Status 01/10/2023 FINAL  Final  MRSA Next Gen by PCR, Nasal     Status: None   Collection Time: 01/06/23 11:31 AM   Specimen: Nasal Mucosa; Nasal Swab  Result Value Ref Range Status   MRSA by PCR Next Gen NOT DETECTED NOT DETECTED Final    Comment: (NOTE) The GeneXpert MRSA Assay (FDA approved for NASAL specimens only), is one component of a comprehensive MRSA colonization surveillance program. It is not intended to diagnose MRSA infection nor to guide or monitor treatment for MRSA infections. Test performance is not FDA approved in patients less than 72 years old. Performed at [facility], [address] [place], [place] [address]     Procedures and diagnostic studies:  No results found.             LOS: 5 days   Habib Kise  [facility]   Pager on [email or web address]. If 7PM-7AM, please contact night-coverage at [email or web address]     01/10/2023, 5:04 PM

## 2023-01-10 NOTE — TOC Progression Note (Signed)
Transition of Care [facility]) - Progression Note    Patient Details  Name: PERSEUS TAKALA MRN: [id number] Date of Birth: October 06, 1941  Transition of Care [facility]) CM/SW Contact  Beverly Sessions, RN Phone Number: 01/10/2023, 3:49 PM  Clinical Narrative:    Received return call from husband Herbie [place].  He states that would like to home with hospice at discharge.  He state he would like to use the agency that came to speak with them this admission.   Misty with [employer] and MD notified Husbands states they have all needed DME at home. And will need EMS transport at discharge          Expected Discharge Plan and Services                                               Social Determinants of Health (SDOH) Interventions SDOH Screenings   Food Insecurity: No Food Insecurity (12/20/2022)  Housing: Low Risk  (12/20/2022)  Transportation Needs: No Transportation Needs (12/20/2022)  Utilities: Not At Risk (12/20/2022)  Depression (PHQ2-9): Low Risk  (01/29/2019)  Tobacco Use: Low Risk  (12/20/2022)    Readmission Risk Interventions    12/24/2022   11:39 AM 11/07/2022    4:05 PM 08/24/2022   10:41 AM  Readmission Risk Prevention Plan  Transportation Screening Complete Complete Complete  PCP or Specialist Appt within 3-5 Days   Complete  HRI or [place]   Not Complete  HRI or Home Care Consult comments   SW will arrange this admission  Social Work Consult for [place] Planning/Counseling   Complete  Palliative Care Screening   Not Applicable  Medication Review Press photographer) Complete Complete Complete  HRI or [place] Not Applicable Complete

## 2023-01-10 NOTE — TOC Progression Note (Signed)
Transition of Care [facility]) - Progression Note    Patient Details  Name: [name] MRN: [id number] Date of Birth: June 27, 1941  Transition of Care [facility]) CM/SW Contact  Beverly Sessions, RN Phone Number: 01/10/2023, 3:38 PM  Clinical Narrative:     Voicemail left for husband Herbie [place] to discuss disposition Called son Roderic Palau.  Unable to leave voicemail   Previous admission patient required EMS transport at discharge       Expected Discharge Plan and Services                                               Social Determinants of Health (SDOH) Interventions SDOH Screenings   Food Insecurity: No Food Insecurity (12/20/2022)  Housing: Low Risk  (12/20/2022)  Transportation Needs: No Transportation Needs (12/20/2022)  Utilities: Not At Risk (12/20/2022)  Depression (PHQ2-9): Low Risk  (01/29/2019)  Tobacco Use: Low Risk  (12/20/2022)    Readmission Risk Interventions    12/24/2022   11:39 AM 11/07/2022    4:05 PM 08/24/2022   10:41 AM  Readmission Risk Prevention Plan  Transportation Screening Complete Complete Complete  PCP or Specialist Appt within 3-5 Days   Complete  HRI or [place]   Not Complete  HRI or Home Care Consult comments   SW will arrange this admission  Social Work Consult for [place] Planning/Counseling   Complete  Palliative Care Screening   Not Applicable  Medication Review Press photographer) Complete Complete Complete  HRI or [place] Not Applicable Complete

## 2023-01-11 ENCOUNTER — Encounter (INDEPENDENT_AMBULATORY_CARE_PROVIDER_SITE_OTHER): Payer: PPO

## 2023-01-11 ENCOUNTER — Ambulatory Visit (INDEPENDENT_AMBULATORY_CARE_PROVIDER_SITE_OTHER): Payer: PPO | Admitting: Nurse Practitioner

## 2023-01-11 DIAGNOSIS — R6520 Severe sepsis without septic shock: Secondary | ICD-10-CM | POA: Diagnosis not present

## 2023-01-11 DIAGNOSIS — A419 Sepsis, unspecified organism: Secondary | ICD-10-CM | POA: Diagnosis not present

## 2023-01-11 DIAGNOSIS — T148XXA Other injury of unspecified body region, initial encounter: Secondary | ICD-10-CM | POA: Diagnosis not present

## 2023-01-11 DIAGNOSIS — I5032 Chronic diastolic (congestive) heart failure: Secondary | ICD-10-CM | POA: Diagnosis not present

## 2023-01-11 MED ORDER — ADULT MULTIVITAMIN W/MINERALS CH: 1.0000 | ORAL_TABLET | Freq: Every day | ORAL | Status: AC

## 2023-01-11 MED ORDER — LISINOPRIL 10 MG PO TABS
5.0000 mg | ORAL_TABLET | Freq: Every day | ORAL | Status: DC
  Administered 2023-01-11 – 2023-01-15 (×5): 5 mg via ORAL
  Filled 2023-01-11 (×5): qty 1

## 2023-01-11 NOTE — Plan of Care (Addendum)
PMT following for goals of care conversations as needed.  Upon chart review, it appears patient's husband has decided to take patient home with hospice, and would like to use [facility] as liaison has already spoken with him earlier this week about their services.  As goals are clear, PMT will sign off at this time.  Please reconsult if needs arise.

## 2023-01-11 NOTE — Care Management Important Message (Signed)
Important Message  Patient Details  Name: MIOSHA SUITT MRN: [id number] Date of Birth: Feb 28, 1941   Medicare Important Message Given:  Other (see comment)  Patient is discharging home with hospice. Out of respect for the patient and family no Important Message from [facility] given.   Juliann Pulse A Laryn Venning 01/11/2023, 8:42 AM

## 2023-01-11 NOTE — TOC Progression Note (Addendum)
Transition of Care [facility]) - Progression Note    Patient Details  Name: [name] MRN: [id number] Date of Birth: October 05, 1941  Transition of Care [facility]) CM/SW Contact  Ross Ludwig, [place] Phone Number: 01/11/2023, 12:12 PM  Clinical Narrative:     CSW spoke to patient's husband Herbie [place] [phone].  Patient and husband have now decided they want to go home with home health PT, OT, RN, and aide instead of home with hospice.  Patient currently open to [facility].    Patient and husband would like [facility] to follow outpatient for palliative, [facility] liaison aware.  Once patient is medically ready for discharge will need EMS transport home.  Per attending physician, ID is being consulted for antibiotic treatment.  TOC to continue to follow patient's progress throughout discharge planning.         Expected Discharge Plan and [place] with home health PT, OT, RN, and aide through [facility]. Palliative to follow outpatient through [place].       Expected Discharge Date: 01/11/23                                     Social Determinants of Health (SDOH) Interventions SDOH Screenings   Food Insecurity: No Food Insecurity (12/20/2022)  Housing: Low Risk  (12/20/2022)  Transportation Needs: No Transportation Needs (12/20/2022)  Utilities: Not At Risk (12/20/2022)  Depression (PHQ2-9): Low Risk  (01/29/2019)  Tobacco Use: Low Risk  (12/20/2022)    Readmission Risk Interventions    12/24/2022   11:39 AM 11/07/2022    4:05 PM 08/24/2022   10:41 AM  Readmission Risk Prevention Plan  Transportation Screening Complete Complete Complete  PCP or Specialist Appt within 3-5 Days   Complete  HRI or [place]   Not Complete  HRI or Home Care Consult comments   SW will arrange this admission  Social Work Consult for [place] Planning/Counseling   Complete  Palliative Care Screening   Not Applicable  Medication Review Press photographer) Complete Complete Complete   HRI or [place] Not Applicable Complete

## 2023-01-11 NOTE — Progress Notes (Incomplete)
   Date of Admission:  01/05/2023    ID: [name] is a 82 y.o. female  Principal Problem:   Severe sepsis ([place]) Active Problems:   Diabetes mellitus, type 2 ([place])   HLD (hyperlipidemia)   Cancer of right breast ([place])   PAD (peripheral artery disease) ([place])   Lymphedema   Acquired hypothyroidism   Benign essential hypertension   History of mastectomy, right   Chronic diastolic CHF (congestive heart failure) (HCC)   Malnutrition of moderate degree   Pressure ulcers of skin of multiple topographic sites   Chronic ulcer of great toe of left foot with fat layer exposed ([place])   CKD (chronic kidney disease) stage 2, GFR 60-89 ml/min   Ulcer of both feet, limited to breakdown of skin (HCC)   Leukocytosis   Wound infection   Anemia of chronic disease   Decubitus ulcer of trochanter, right, stage III (HCC)   Hypoalbuminemia    Subjective: Pt is feeling better Alert eating  Medications:   acetaminophen  1,000 mg Oral Q8H   allopurinol  300 mg Oral Daily   vitamin C  500 mg Oral BID   atenolol  50 mg Oral Daily   atorvastatin  40 mg Oral Daily   Chlorhexidine Gluconate Cloth  6 each Topical Daily   cyanocobalamin  1,000 mcg Oral Daily   enoxaparin (LOVENOX) injection  40 mg Subcutaneous Q24H   feeding supplement  237 mL Oral TID BM   levothyroxine  88 mcg Oral Q0600   multivitamin with minerals  1 tablet Oral Daily   zinc sulfate  220 mg Oral Daily    Objective: Vital signs in last 24 hours: Temp:  [97.9 F (36.6 C)-98.6 F (37 C)] 97.9 F (36.6 C) (03/01 0819) Pulse Rate:  [70-75] 75 (03/01 0819) Resp:  [18] 18 (03/01 0819) BP: (133-172)/(62-86) 172/86 (03/01 0819) SpO2:  [99 %-100 %] 100 % (03/01 0819)    PHYSICAL EXAM:  General: Alert, cooperative, no distress, emaciated Head: Normocephalic, without obvious abnormality, atraumatic. Eyes: Conjunctivae clear, anicteric sclerae. Pupils are equal ENT Nares normal. No drainage or sinus tenderness. Lips,  mucosa, and tongue normal. No Thrush Neck: Supple, symmetrical, no adenopathy, thyroid: non tender no carotid bruit and no JVD. Lungs: b/la ir entry. Heart: Regular rate and rhythm, no murmur, rub or gallop. Abdomen: Soft, non-tender,not distended. Bowel sounds normal. No masses Skin- rt hip pressure wound with eschar Sarcum stage 3 decub B/l heel stage 3     Skin: as baove Lymph: Cervical, supraclavicular normal. Neurologic: Grossly non-focal  Lab Results Recent Labs    01/09/23 0302  WBC 8.4  HGB 8.8*  HCT 29.2*  NA 138  K 3.5  CL 108  CO2 21*  BUN 19  CREATININE 0.80   Liver Panel No results for input(s): "PROT", "ALBUMIN", "AST", "ALT", "ALKPHOS", "BILITOT", "BILIDIR", "IBILI" in the last 72 hours.   Microbiology: 01/05/23- BC ng   Assessment/Plan: Multiple pressure ulcers with infection involving the sacrum, right hip at the greater trochanter area, bilateral heels Seen by surgery not a candidate for intervention because of the frailty On dapto and zosyn and improving Sent culture- will decide on discharge antibiotic depending on that   Anemia with hemoglobin of 7.9    History of Enterococcus bacteremia, treated   Malnutrition/hypoalbuminemia  H/o ca breast- rt mastectomy  Discussed the management with patient and care team

## 2023-01-11 NOTE — Progress Notes (Signed)
Progress Note    [name]  [id number] DOB: February 27, 1941  DOA: 01/05/2023 PCP: Tracie Harrier, MD      Brief Narrative:    Medical records reviewed and are as summarized below:   [name] is a 82 year old female with history of chronic wounds, gout, hypertension, hyperlipidemia, iron deficiency anemia, acquired hypothyroid, non-insulin-dependent diabetes mellitus, who presented to the ED on evening of 01/05/23 for evaluation of worsening right hip wound.  Patient's wound nurse reportedly concerned of progressive signs of infection over prior 2-3 days.  Patient reports significant pain related to the hip and sacral wounds.  Admitted to medicine service with general surgery consulted for debridement.    Started on empiric IV Vancomycin and Cefepime for empiric broad spectrum coverage.  Infectious disease consulted.  Antibiotics were changed to IV Daptomycin and Zosyn.  Palliative Care consulted for goals of care discussions.  Patient is debilitated, malnourished, chronically ill.  She has poor long term prognosis and is high risk for morbidity and mortality.        Assessment/Plan:   Principal Problem:   Severe sepsis ([place]) Active Problems:   Cancer of right breast (HCC)   Wound infection   Chronic diastolic CHF (congestive heart failure) (HCC)   Anemia of chronic disease   Chronic ulcer of great toe of left foot with fat layer exposed (HCC)   Benign essential hypertension   HLD (hyperlipidemia)   Acquired hypothyroidism   CKD (chronic kidney disease) stage 2, GFR 60-89 ml/min   Ulcer of both feet, limited to breakdown of skin (HCC)   Pressure ulcers of skin of multiple topographic sites   Diabetes mellitus, type 2 (HCC)   PAD (peripheral artery disease) (HCC)   Lymphedema   History of mastectomy, right   Malnutrition of moderate degree   Leukocytosis   Decubitus ulcer of trochanter, right, stage III (HCC)    Hypoalbuminemia   Nutrition Problem: Moderate Malnutrition Etiology: chronic illness (DM, chronic wounds)  Signs/Symptoms: mild fat depletion, moderate fat depletion, mild muscle depletion, moderate muscle depletion   Body mass index is 23.43 kg/m.   Severe sepsis, osteomyelitis of sacrum and right greater trochanter: S/p debridement of right trochanteric decubitus ulcer on 01/06/2023.  Continue IV Zosyn and daptomycin.  No growth on blood cultures.  Follow-up wound cultures.  Analgesics as needed for pain.  Follow-up with ID for further recommendations.   Hypotension: Resolved.  BP is now elevated.  Restart lisinopril.   Chronic left great toe ulcer, bilateral foot wounds: Continue local wound care   Other comorbidities include hypoalbuminemia, hypertension, hyperlipidemia, hypothyroidism, CKD stage II, history of right breast cancer s/p mastectomy, chronic lymphedema of lower extremities, type II DM, PVD   Plan of care was discussed with the patient in detail.  We discussed hospice.  She was interested in hospice.  She was wondering if he was going to pay for her care at home and whether hospice would be available 24 hours a day.  I answered all her questions to the best of my ability.  I was later informed by [name], Education officer, museum, that patient has been no longer wanted to pursue hospice and she wanted patient to be discharged home with home health care.  I spoke to her husband who confirmed that he no longer wanted to pursue hospice. I  Informed him that since patient was no longer being discharged with hospice, it would be appropriate to wait for wound culture results and discharge her  with the appropriate antibiotics for osteomyelitis.  He is agreeable with the plan.   Diet Order             Diet general           Diet regular Room service appropriate? No; Fluid consistency: Thin  Diet effective now                            Consultants: ID  specialist  Procedures: None    Medications:    acetaminophen  1,000 mg Oral Q8H   allopurinol  300 mg Oral Daily   vitamin C  500 mg Oral BID   atenolol  50 mg Oral Daily   atorvastatin  40 mg Oral Daily   Chlorhexidine Gluconate Cloth  6 each Topical Daily   cyanocobalamin  1,000 mcg Oral Daily   enoxaparin (LOVENOX) injection  40 mg Subcutaneous Q24H   feeding supplement  237 mL Oral TID BM   levothyroxine  88 mcg Oral Q0600   multivitamin with minerals  1 tablet Oral Daily   zinc sulfate  220 mg Oral Daily   Continuous Infusions:  DAPTOmycin (CUBICIN) 400 mg in sodium chloride 0.9 % IVPB Stopped (01/10/23 2056)   piperacillin-tazobactam (ZOSYN)  IV 12.5 mL/hr at 01/11/23 0536     Anti-infectives (From admission, onward)    Start     Dose/Rate Route Frequency Ordered Stop   01/09/23 0000  vancomycin (VANCOCIN) IVPB 1000 mg/200 mL premix  Status:  Discontinued        1,000 mg 200 mL/hr over 60 Minutes Intravenous Every 36 hours 01/07/23 1308 01/07/23 1557   01/08/23 2000  DAPTOmycin (CUBICIN) 400 mg in sodium chloride 0.9 % IVPB        8 mg/kg  52.6 kg 116 mL/hr over 30 Minutes Intravenous Daily 01/07/23 1617     01/08/23 0600  piperacillin-tazobactam (ZOSYN) IVPB 3.375 g        3.375 g 12.5 mL/hr over 240 Minutes Intravenous Every 8 hours 01/07/23 1617     01/07/23 2200  ceFEPIme (MAXIPIME) 2 g in sodium chloride 0.9 % 100 mL IVPB        2 g 200 mL/hr over 30 Minutes Intravenous  Once 01/07/23 1617 01/07/23 2300   01/07/23 1800  vancomycin (VANCOREADY) IVPB 1250 mg/250 mL  Status:  Discontinued        1,250 mg 166.7 mL/hr over 90 Minutes Intravenous Every 48 hours 01/05/23 1708 01/07/23 1120   01/07/23 1215  vancomycin (VANCOREADY) IVPB 1250 mg/250 mL        1,250 mg 166.7 mL/hr over 90 Minutes Intravenous  Once 01/07/23 1125 01/07/23 1530   01/06/23 1000  fluconazole (DIFLUCAN) tablet 100 mg        100 mg Oral Daily 01/05/23 1652 01/11/23 0817   01/05/23 2200   ceFEPIme (MAXIPIME) 2 g in sodium chloride 0.9 % 100 mL IVPB  Status:  Discontinued        2 g 200 mL/hr over 30 Minutes Intravenous Every 12 hours 01/05/23 1708 01/07/23 1557   01/05/23 1645  Ampicillin-Sulbactam (UNASYN) 3 g in sodium chloride 0.9 % 100 mL IVPB        3 g 200 mL/hr over 30 Minutes Intravenous  Once 01/05/23 1630 01/05/23 1721   01/05/23 1645  vancomycin (VANCOCIN) IVPB 1000 mg/200 mL premix  Status:  Discontinued        1,000 mg 200 mL/hr over 60  Minutes Intravenous  Once 01/05/23 1634 01/07/23 1120              Family Communication/Anticipated D/C date and plan/Code Status   DVT prophylaxis: enoxaparin (LOVENOX) injection 40 mg Start: 01/05/23 2200 Place TED hose Start: 01/05/23 1656     Code Status: DNR  Family Communication: Plan discussed with husband over the phone Disposition Plan: Possible discharge home   Status is: Inpatient Remains inpatient appropriate because: Sacral and right greater trochanter osteomyelitis        Subjective:   Interval events noted.  She has no complaints.  Objective:    Vitals:   01/10/23 1550 01/10/23 1940 01/11/23 0442 01/11/23 0819  BP: 133/62 (!) 144/76 (!) 156/86 (!) 172/86  Pulse: 70 75 73 75  Resp: '18 18 18 18  '$ Temp: 98.6 F (37 C) 98.2 F (36.8 C) 98 F (36.7 C) 97.9 F (36.6 C)  TempSrc:      SpO2: 100% 100% 99% 100%  Weight:      Height:       No data found.   Intake/Output Summary (Last 24 hours) at 01/11/2023 1318 Last data filed at 01/11/2023 1117 Gross per 24 hour  Intake 208.1 ml  Output 3250 ml  Net -3041.9 ml   Filed Weights   01/05/23 1511  Weight: 52.6 kg    Exam:   GEN: NAD SKIN: Warm and dry.  Stage III sacral and right greater trochanter decubitus ulcers EYES: EOMI ENT: MMM CV: RRR PULM: CTA B ABD: soft, ND, NT, +BS CNS: AAO x 3, non focal EXT: No edema or tenderness    Data Reviewed:   I have personally reviewed following labs and imaging  studies:  Labs: Labs show the following:   Basic Metabolic Panel: Recent Labs  Lab 01/05/23 1529 01/06/23 0416 01/07/23 0621 01/09/23 0302  NA 136 137 136 138  K 3.5 3.7 3.9 3.5  CL 103 105 107 108  CO2 20* 21* 24 21*  GLUCOSE 89 88 104* 68*  BUN '11 10 22 19  '$ CREATININE 0.90 0.94 0.89 0.80  CALCIUM 8.5* 8.1* 7.8* 8.1*  MG  --   --  2.0  --   PHOS  --   --  2.7  --    GFR Estimated Creatinine Clearance: 42.3 mL/min (by C-G formula based on SCr of 0.8 mg/dL). Liver Function Tests: Recent Labs  Lab 01/05/23 1529  AST 21  ALT 8  ALKPHOS 50  BILITOT 0.6  PROT 6.8  ALBUMIN 2.5*   No results for input(s): "LIPASE", "AMYLASE" in the last 168 hours. No results for input(s): "AMMONIA" in the last 168 hours. Coagulation profile Recent Labs  Lab 01/06/23 0416  INR 1.5*    CBC: Recent Labs  Lab 01/05/23 1529 01/06/23 0416 01/07/23 0621 01/08/23 0628 01/09/23 0302  WBC 11.8* 7.6 8.7 11.0* 8.4  NEUTROABS 10.1*  --   --   --   --   HGB 9.3* 8.7* 7.9* 9.5* 8.8*  HCT 30.5* 28.0* 26.6* 32.2* 29.2*  MCV 81.1 78.9* 81.6 82.6 81.6  PLT 398 372 279 333 290   Cardiac Enzymes: Recent Labs  Lab 01/08/23 0628  CKTOTAL 161   BNP (last 3 results) No results for input(s): "PROBNP" in the last 8760 hours. CBG: Recent Labs  Lab 01/09/23 1142 01/09/23 1659 01/09/23 2129 01/10/23 0751 01/10/23 1105  GLUCAP 106* 84 90 117* 127*   D-Dimer: No results for input(s): "DDIMER" in the last 72 hours. Hgb A1c:  No results for input(s): "HGBA1C" in the last 72 hours. Lipid Profile: No results for input(s): "CHOL", "HDL", "LDLCALC", "TRIG", "CHOLHDL", "LDLDIRECT" in the last 72 hours. Thyroid function studies: No results for input(s): "TSH", "T4TOTAL", "T3FREE", "THYROIDAB" in the last 72 hours.  Invalid input(s): "FREET3" Anemia work up: No results for input(s): "VITAMINB12", "FOLATE", "FERRITIN", "TIBC", "IRON", "RETICCTPCT" in the last 72 hours. Sepsis Labs: Recent  Labs  Lab 01/05/23 1529 01/05/23 2044 01/06/23 0416 01/06/23 0917 01/07/23 0621 01/08/23 0628 01/09/23 0302  PROCALCITON <0.10  --   --   --   --   --   --   WBC 11.8*  --  7.6  --  8.7 11.0* 8.4  LATICACIDVEN 3.1* 2.3*  --  1.5  --   --   --     Microbiology Recent Results (from the past 240 hour(s))  Blood culture (routine x 2)     Status: None   Collection Time: 01/05/23  3:29 PM   Specimen: BLOOD  Result Value Ref Range Status   Specimen Description BLOOD RIGHT ANTECUBITAL  Final   Special Requests   Final    BOTTLES DRAWN AEROBIC AND ANAEROBIC Blood Culture adequate volume   Culture   Final    NO GROWTH 5 DAYS Performed at [facility], [address] [place], [place] [address]    Report Status 01/10/2023 FINAL  Final  Blood culture (routine x 2)     Status: None   Collection Time: 01/05/23  4:47 PM   Specimen: BLOOD  Result Value Ref Range Status   Specimen Description BLOOD BLOOD RIGHT HAND  Final   Special Requests   Final    BOTTLES DRAWN AEROBIC AND ANAEROBIC Blood Culture adequate volume   Culture   Final    NO GROWTH 5 DAYS Performed at [facility], [address], [place], [place] [address]    Report Status 01/10/2023 FINAL  Final  MRSA Next Gen by PCR, Nasal     Status: None   Collection Time: 01/06/23 11:31 AM   Specimen: Nasal Mucosa; Nasal Swab  Result Value Ref Range Status   MRSA by PCR Next Gen NOT DETECTED NOT DETECTED Final    Comment: (NOTE) The GeneXpert MRSA Assay (FDA approved for NASAL specimens only), is one component of a comprehensive MRSA colonization surveillance program. It is not intended to diagnose MRSA infection nor to guide or monitor treatment for MRSA infections. Test performance is not FDA approved in patients less than 71 years old. Performed at [facility], [address], [place], [place] [address]   Aerobic Culture w Gram Stain (superficial specimen)     Status: None (Preliminary  result)   Collection Time: 01/10/23  6:12 PM   Specimen: Joint, Hip; Wound  Result Value Ref Range Status   Specimen Description   Final    HIP RIGHT Performed at [facility], [address], [place], [place] [address]    Special Requests   Final    NONE Performed at [facility], [place], [place] [address]    Gram Stain   Final    FEW WBC PRESENT,BOTH PMN AND MONONUCLEAR NO ORGANISMS SEEN    Culture   Final    NO GROWTH < 12 HOURS Performed at [facility], [place] [address], [place], [place] [address]    Report Status PENDING  Incomplete    Procedures and diagnostic studies:  No results found.  LOS: 6 days   Rainn Zupko  [facility]   Pager on [email or web address]. If 7PM-7AM, please contact night-coverage at [email or web address]     01/11/2023, 1:18 PM

## 2023-01-12 DIAGNOSIS — T148XXA Other injury of unspecified body region, initial encounter: Secondary | ICD-10-CM | POA: Diagnosis not present

## 2023-01-12 DIAGNOSIS — L089 Local infection of the skin and subcutaneous tissue, unspecified: Secondary | ICD-10-CM | POA: Diagnosis not present

## 2023-01-12 DIAGNOSIS — A419 Sepsis, unspecified organism: Secondary | ICD-10-CM | POA: Diagnosis not present

## 2023-01-12 DIAGNOSIS — R6520 Severe sepsis without septic shock: Secondary | ICD-10-CM | POA: Diagnosis not present

## 2023-01-12 NOTE — Progress Notes (Signed)
Progress Note    [name]  [id number] DOB: 1941-03-23  DOA: 01/05/2023 PCP: Tracie Harrier, MD      Brief Narrative:    Medical records reviewed and are as summarized below:   [name] is a 82 year old female with history of chronic wounds, gout, hypertension, hyperlipidemia, iron deficiency anemia, acquired hypothyroid, non-insulin-dependent diabetes mellitus, who presented to the ED on evening of 01/05/23 for evaluation of worsening right hip wound.  Patient's wound nurse reportedly concerned of progressive signs of infection over prior 2-3 days.  Patient reports significant pain related to the hip and sacral wounds.  Admitted to medicine service with general surgery consulted for debridement.    Started on empiric IV Vancomycin and Cefepime for empiric broad spectrum coverage.  Infectious disease consulted.  Antibiotics were changed to IV Daptomycin and Zosyn.  Palliative Care consulted for goals of care discussions.  Patient is debilitated, malnourished, chronically ill.  She has poor long term prognosis and is high risk for morbidity and mortality.        Assessment/Plan:   Principal Problem:   Severe sepsis ([place]) Active Problems:   Cancer of right breast (HCC)   Wound infection   Chronic diastolic CHF (congestive heart failure) (HCC)   Anemia of chronic disease   Chronic ulcer of great toe of left foot with fat layer exposed (HCC)   Benign essential hypertension   HLD (hyperlipidemia)   Acquired hypothyroidism   CKD (chronic kidney disease) stage 2, GFR 60-89 ml/min   Ulcer of both feet, limited to breakdown of skin (HCC)   Pressure ulcers of skin of multiple topographic sites   Diabetes mellitus, type 2 (HCC)   PAD (peripheral artery disease) (HCC)   Lymphedema   History of mastectomy, right   Malnutrition of moderate degree   Leukocytosis   Decubitus ulcer of trochanter, right, stage III (HCC)    Hypoalbuminemia   Nutrition Problem: Moderate Malnutrition Etiology: chronic illness (DM, chronic wounds)  Signs/Symptoms: mild fat depletion, moderate fat depletion, mild muscle depletion, moderate muscle depletion   Body mass index is 23.43 kg/m.   Severe sepsis, osteomyelitis of sacrum and right greater trochanter: S/p debridement of right trochanteric decubitus ulcer on 01/06/2023.  Continue IV Zosyn and daptomycin.  No growth on blood cultures.  No growth on wound cultures thus far.  Analgesics as needed for pain.  Follow-up with ID specialist.     Hypertension: Continue lisinopril and atenolol   Chronic left great toe ulcer, bilateral foot wounds: Continue local wound care   Other comorbidities include hypoalbuminemia, hypertension, hyperlipidemia, hypothyroidism, CKD stage II, history of right breast cancer s/p mastectomy, chronic lymphedema of lower extremities, type II DM, PVD   Hypotension: Resolved   Diet Order             Diet general           Diet regular Room service appropriate? No; Fluid consistency: Thin  Diet effective now                            Consultants: ID specialist  Procedures: None    Medications:    acetaminophen  1,000 mg Oral Q8H   allopurinol  300 mg Oral Daily   vitamin C  500 mg Oral BID   atenolol  50 mg Oral Daily   atorvastatin  40 mg Oral Daily   Chlorhexidine Gluconate Cloth  6 each Topical Daily  cyanocobalamin  1,000 mcg Oral Daily   enoxaparin (LOVENOX) injection  40 mg Subcutaneous Q24H   feeding supplement  237 mL Oral TID BM   levothyroxine  88 mcg Oral Q0600   lisinopril  5 mg Oral Daily   multivitamin with minerals  1 tablet Oral Daily   zinc sulfate  220 mg Oral Daily   Continuous Infusions:  DAPTOmycin (CUBICIN) 400 mg in sodium chloride 0.9 % IVPB Stopped (01/11/23 2114)   piperacillin-tazobactam (ZOSYN)  IV 12.5 mL/hr at 01/12/23 0520     Anti-infectives (From admission, onward)     Start     Dose/Rate Route Frequency Ordered Stop   01/09/23 0000  vancomycin (VANCOCIN) IVPB 1000 mg/200 mL premix  Status:  Discontinued        1,000 mg 200 mL/hr over 60 Minutes Intravenous Every 36 hours 01/07/23 1308 01/07/23 1557   01/08/23 2000  DAPTOmycin (CUBICIN) 400 mg in sodium chloride 0.9 % IVPB        8 mg/kg  52.6 kg 116 mL/hr over 30 Minutes Intravenous Daily 01/07/23 1617     01/08/23 0600  piperacillin-tazobactam (ZOSYN) IVPB 3.375 g        3.375 g 12.5 mL/hr over 240 Minutes Intravenous Every 8 hours 01/07/23 1617     01/07/23 2200  ceFEPIme (MAXIPIME) 2 g in sodium chloride 0.9 % 100 mL IVPB        2 g 200 mL/hr over 30 Minutes Intravenous  Once 01/07/23 1617 01/07/23 2300   01/07/23 1800  vancomycin (VANCOREADY) IVPB 1250 mg/250 mL  Status:  Discontinued        1,250 mg 166.7 mL/hr over 90 Minutes Intravenous Every 48 hours 01/05/23 1708 01/07/23 1120   01/07/23 1215  vancomycin (VANCOREADY) IVPB 1250 mg/250 mL        1,250 mg 166.7 mL/hr over 90 Minutes Intravenous  Once 01/07/23 1125 01/07/23 1530   01/06/23 1000  fluconazole (DIFLUCAN) tablet 100 mg        100 mg Oral Daily 01/05/23 1652 01/11/23 0817   01/05/23 2200  ceFEPIme (MAXIPIME) 2 g in sodium chloride 0.9 % 100 mL IVPB  Status:  Discontinued        2 g 200 mL/hr over 30 Minutes Intravenous Every 12 hours 01/05/23 1708 01/07/23 1557   01/05/23 1645  Ampicillin-Sulbactam (UNASYN) 3 g in sodium chloride 0.9 % 100 mL IVPB        3 g 200 mL/hr over 30 Minutes Intravenous  Once 01/05/23 1630 01/05/23 1721   01/05/23 1645  vancomycin (VANCOCIN) IVPB 1000 mg/200 mL premix  Status:  Discontinued        1,000 mg 200 mL/hr over 60 Minutes Intravenous  Once 01/05/23 1634 01/07/23 1120              Family Communication/Anticipated D/C date and plan/Code Status   DVT prophylaxis: enoxaparin (LOVENOX) injection 40 mg Start: 01/05/23 2200 Place TED hose Start: 01/05/23 1656     Code Status:  DNR  Family Communication: Plan discussed with husband over the phone Disposition Plan: Possible discharge home   Status is: Inpatient Remains inpatient appropriate because: Sacral and right greater trochanter osteomyelitis        Subjective:   No complaints.  She feels better.  Nurse assistant was at the bedside.  Objective:    Vitals:   01/11/23 1640 01/11/23 1954 01/12/23 0408 01/12/23 0841  BP: (!) 147/74 (!) 141/72 (!) 143/83 (!) 153/68  Pulse: 73 (!) 103  68 81  Resp: '14 18 17 18  '$ Temp: 98.3 F (36.8 C) 98.3 F (36.8 C) 98.6 F (37 C) 98 F (36.7 C)  TempSrc: Oral     SpO2: 99% 93% 94% 90%  Weight:      Height:       No data found.   Intake/Output Summary (Last 24 hours) at 01/12/2023 1332 Last data filed at 01/12/2023 0520 Gross per 24 hour  Intake 210.11 ml  Output 850 ml  Net -639.89 ml   Filed Weights   01/05/23 1511  Weight: 52.6 kg    Exam:  GEN: NAD SKIN: Warm and dry EYES: EOMI ENT: MMM CV: RRR PULM: CTA B ABD: soft, ND, NT, +BS CNS: AAO x 3, non focal EXT: No edema or tenderness     Data Reviewed:   I have personally reviewed following labs and imaging studies:  Labs: Labs show the following:   Basic Metabolic Panel: Recent Labs  Lab 01/05/23 1529 01/06/23 0416 01/07/23 0621 01/09/23 0302  NA 136 137 136 138  K 3.5 3.7 3.9 3.5  CL 103 105 107 108  CO2 20* 21* 24 21*  GLUCOSE 89 88 104* 68*  BUN '11 10 22 19  '$ CREATININE 0.90 0.94 0.89 0.80  CALCIUM 8.5* 8.1* 7.8* 8.1*  MG  --   --  2.0  --   PHOS  --   --  2.7  --    GFR Estimated Creatinine Clearance: 42.3 mL/min (by C-G formula based on SCr of 0.8 mg/dL). Liver Function Tests: Recent Labs  Lab 01/05/23 1529  AST 21  ALT 8  ALKPHOS 50  BILITOT 0.6  PROT 6.8  ALBUMIN 2.5*   No results for input(s): "LIPASE", "AMYLASE" in the last 168 hours. No results for input(s): "AMMONIA" in the last 168 hours. Coagulation profile Recent Labs  Lab 01/06/23 0416   INR 1.5*    CBC: Recent Labs  Lab 01/05/23 1529 01/06/23 0416 01/07/23 0621 01/08/23 0628 01/09/23 0302  WBC 11.8* 7.6 8.7 11.0* 8.4  NEUTROABS 10.1*  --   --   --   --   HGB 9.3* 8.7* 7.9* 9.5* 8.8*  HCT 30.5* 28.0* 26.6* 32.2* 29.2*  MCV 81.1 78.9* 81.6 82.6 81.6  PLT 398 372 279 333 290   Cardiac Enzymes: Recent Labs  Lab 01/08/23 0628  CKTOTAL 161   BNP (last 3 results) No results for input(s): "PROBNP" in the last 8760 hours. CBG: Recent Labs  Lab 01/09/23 1142 01/09/23 1659 01/09/23 2129 01/10/23 0751 01/10/23 1105  GLUCAP 106* 84 90 117* 127*   D-Dimer: No results for input(s): "DDIMER" in the last 72 hours. Hgb A1c: No results for input(s): "HGBA1C" in the last 72 hours. Lipid Profile: No results for input(s): "CHOL", "HDL", "LDLCALC", "TRIG", "CHOLHDL", "LDLDIRECT" in the last 72 hours. Thyroid function studies: No results for input(s): "TSH", "T4TOTAL", "T3FREE", "THYROIDAB" in the last 72 hours.  Invalid input(s): "FREET3" Anemia work up: No results for input(s): "VITAMINB12", "FOLATE", "FERRITIN", "TIBC", "IRON", "RETICCTPCT" in the last 72 hours. Sepsis Labs: Recent Labs  Lab 01/05/23 1529 01/05/23 2044 01/06/23 0416 01/06/23 0917 01/07/23 0621 01/08/23 0628 01/09/23 0302  PROCALCITON <0.10  --   --   --   --   --   --   WBC 11.8*  --  7.6  --  8.7 11.0* 8.4  LATICACIDVEN 3.1* 2.3*  --  1.5  --   --   --     Microbiology  Recent Results (from the past 240 hour(s))  Blood culture (routine x 2)     Status: None   Collection Time: 01/05/23  3:29 PM   Specimen: BLOOD  Result Value Ref Range Status   Specimen Description BLOOD RIGHT ANTECUBITAL  Final   Special Requests   Final    BOTTLES DRAWN AEROBIC AND ANAEROBIC Blood Culture adequate volume   Culture   Final    NO GROWTH 5 DAYS Performed at [facility], [address], [place], [place] [address]    Report Status 01/10/2023 FINAL  Final  Blood culture (routine x 2)      Status: None   Collection Time: 01/05/23  4:47 PM   Specimen: BLOOD  Result Value Ref Range Status   Specimen Description BLOOD BLOOD RIGHT HAND  Final   Special Requests   Final    BOTTLES DRAWN AEROBIC AND ANAEROBIC Blood Culture adequate volume   Culture   Final    NO GROWTH 5 DAYS Performed at [facility], [address] [place], [name] [address]    Report Status 01/10/2023 FINAL  Final  MRSA Next Gen by PCR, Nasal     Status: None   Collection Time: 01/06/23 11:31 AM   Specimen: Nasal Mucosa; Nasal Swab  Result Value Ref Range Status   MRSA by PCR Next Gen NOT DETECTED NOT DETECTED Final    Comment: (NOTE) The GeneXpert MRSA Assay (FDA approved for NASAL specimens only), is one component of a comprehensive MRSA colonization surveillance program. It is not intended to diagnose MRSA infection nor to guide or monitor treatment for MRSA infections. Test performance is not FDA approved in patients less than 39 years old. Performed at [facility], [address] [place], [place] [address]   Aerobic Culture w Gram Stain (superficial specimen)     Status: None (Preliminary result)   Collection Time: 01/10/23  6:12 PM   Specimen: Joint, Hip; Wound  Result Value Ref Range Status   Specimen Description   Final    HIP RIGHT Performed at [facility], [address] [place], [place] [address]    Special Requests   Final    NONE Performed at [facility], [address] [place], [place] [address]    Gram Stain   Final    FEW WBC PRESENT,BOTH PMN AND MONONUCLEAR NO ORGANISMS SEEN    Culture   Final    NO GROWTH 2 DAYS Performed at [facility], [place] [address], [place],  [address]    Report Status PENDING  Incomplete    Procedures and diagnostic studies:  No results found.             LOS: 7 days   Canaan Prue  [facility]   Pager on [email or web address]. If 7PM-7AM, please contact night-coverage  at [email or web address]     01/12/2023, 1:32 PM

## 2023-01-13 DIAGNOSIS — A419 Sepsis, unspecified organism: Secondary | ICD-10-CM | POA: Diagnosis not present

## 2023-01-13 DIAGNOSIS — R6520 Severe sepsis without septic shock: Secondary | ICD-10-CM | POA: Diagnosis not present

## 2023-01-13 DIAGNOSIS — L089 Local infection of the skin and subcutaneous tissue, unspecified: Secondary | ICD-10-CM | POA: Diagnosis not present

## 2023-01-13 DIAGNOSIS — T148XXA Other injury of unspecified body region, initial encounter: Secondary | ICD-10-CM | POA: Diagnosis not present

## 2023-01-13 DIAGNOSIS — M869 Osteomyelitis, unspecified: Secondary | ICD-10-CM | POA: Diagnosis present

## 2023-01-13 LAB — AEROBIC CULTURE W GRAM STAIN (SUPERFICIAL SPECIMEN): Culture: NO GROWTH

## 2023-01-13 NOTE — Progress Notes (Signed)
[facility]) Hospital Liaison Note  [facility] continues to follow peripherally for any hospice or outpatient palliative needs.  Please call with any questions.  Thank you, Margaretmary Eddy, BSN, RN [facility] Liaison [phone]

## 2023-01-13 NOTE — Progress Notes (Addendum)
Progress Note    [name]  [id number] DOB: 1941/08/06  DOA: 01/05/2023 PCP: Tracie Harrier, MD      Brief Narrative:    Medical records reviewed and are as summarized below:   [name] is a 82 year old female with history of chronic wounds, gout, hypertension, hyperlipidemia, iron deficiency anemia, acquired hypothyroid, non-insulin-dependent diabetes mellitus, who presented to the ED on evening of 01/05/23 for evaluation of worsening right hip wound.  Patient's wound nurse reportedly concerned of progressive signs of infection over prior 2-3 days.  Patient reports significant pain related to the hip and sacral wounds.  Admitted to medicine service with general surgery consulted for debridement.    Started on empiric IV Vancomycin and Cefepime for empiric broad spectrum coverage.  Infectious disease consulted.  Antibiotics were changed to IV Daptomycin and Zosyn.  Palliative Care consulted for goals of care discussions.  Patient is debilitated, malnourished, chronically ill.  She has poor long term prognosis and is high risk for morbidity and mortality.        Assessment/Plan:   Principal Problem:   Severe sepsis ([place]) Active Problems:   Cancer of right breast (HCC)   Wound infection   Chronic diastolic CHF (congestive heart failure) (HCC)   Anemia of chronic disease   Chronic ulcer of great toe of left foot with fat layer exposed (HCC)   Benign essential hypertension   HLD (hyperlipidemia)   Acquired hypothyroidism   CKD (chronic kidney disease) stage 2, GFR 60-89 ml/min   Ulcer of both feet, limited to breakdown of skin (HCC)   Pressure ulcers of skin of multiple topographic sites   Diabetes mellitus, type 2 (HCC)   PAD (peripheral artery disease) (HCC)   Lymphedema   History of mastectomy, right   Malnutrition of moderate degree   Leukocytosis   Decubitus ulcer of trochanter, right, stage III (HCC)   Hypoalbuminemia    Osteomyelitis of right hip (HCC)   Nutrition Problem: Moderate Malnutrition Etiology: chronic illness (DM, chronic wounds)  Signs/Symptoms: mild fat depletion, moderate fat depletion, mild muscle depletion, moderate muscle depletion   Body mass index is 23.43 kg/m.   Severe sepsis, osteomyelitis of sacrum and right greater trochanter: S/p debridement of right trochanteric decubitus ulcer on 01/06/2023.  She remains on IV Zosyn and daptomycin.No growth on blood cultures.  No growth on wound cultures thus far.  Analgesics as needed for pain.  Follow-up with ID specialist.     Hypertension: BP is better.  Continue atenolol and lisinopril  Chronic left great toe ulcer, bilateral foot wounds: Continue local wound care   Other comorbidities include hypoalbuminemia, hypertension, hyperlipidemia, hypothyroidism, CKD stage II, history of right breast cancer s/p mastectomy, chronic lymphedema of lower extremities, type II DM, PVD   Hypotension: Resolved   Diet Order             Diet general           Diet regular Room service appropriate? No; Fluid consistency: Thin  Diet effective now                            Consultants: ID specialist  Procedures: None    Medications:    acetaminophen  1,000 mg Oral Q8H   allopurinol  300 mg Oral Daily   vitamin C  500 mg Oral BID   atenolol  50 mg Oral Daily   atorvastatin  40 mg Oral Daily  Chlorhexidine Gluconate Cloth  6 each Topical Daily   cyanocobalamin  1,000 mcg Oral Daily   enoxaparin (LOVENOX) injection  40 mg Subcutaneous Q24H   feeding supplement  237 mL Oral TID BM   levothyroxine  88 mcg Oral Q0600   lisinopril  5 mg Oral Daily   multivitamin with minerals  1 tablet Oral Daily   zinc sulfate  220 mg Oral Daily   Continuous Infusions:  DAPTOmycin (CUBICIN) 400 mg in sodium chloride 0.9 % IVPB Stopped (01/12/23 2212)   piperacillin-tazobactam (ZOSYN)  IV 3.375 g (01/13/23 1307)     Anti-infectives  (From admission, onward)    Start     Dose/Rate Route Frequency Ordered Stop   01/09/23 0000  vancomycin (VANCOCIN) IVPB 1000 mg/200 mL premix  Status:  Discontinued        1,000 mg 200 mL/hr over 60 Minutes Intravenous Every 36 hours 01/07/23 1308 01/07/23 1557   01/08/23 2000  DAPTOmycin (CUBICIN) 400 mg in sodium chloride 0.9 % IVPB        8 mg/kg  52.6 kg 116 mL/hr over 30 Minutes Intravenous Daily 01/07/23 1617     01/08/23 0600  piperacillin-tazobactam (ZOSYN) IVPB 3.375 g        3.375 g 12.5 mL/hr over 240 Minutes Intravenous Every 8 hours 01/07/23 1617     01/07/23 2200  ceFEPIme (MAXIPIME) 2 g in sodium chloride 0.9 % 100 mL IVPB        2 g 200 mL/hr over 30 Minutes Intravenous  Once 01/07/23 1617 01/07/23 2300   01/07/23 1800  vancomycin (VANCOREADY) IVPB 1250 mg/250 mL  Status:  Discontinued        1,250 mg 166.7 mL/hr over 90 Minutes Intravenous Every 48 hours 01/05/23 1708 01/07/23 1120   01/07/23 1215  vancomycin (VANCOREADY) IVPB 1250 mg/250 mL        1,250 mg 166.7 mL/hr over 90 Minutes Intravenous  Once 01/07/23 1125 01/07/23 1530   01/06/23 1000  fluconazole (DIFLUCAN) tablet 100 mg        100 mg Oral Daily 01/05/23 1652 01/11/23 0817   01/05/23 2200  ceFEPIme (MAXIPIME) 2 g in sodium chloride 0.9 % 100 mL IVPB  Status:  Discontinued        2 g 200 mL/hr over 30 Minutes Intravenous Every 12 hours 01/05/23 1708 01/07/23 1557   01/05/23 1645  Ampicillin-Sulbactam (UNASYN) 3 g in sodium chloride 0.9 % 100 mL IVPB        3 g 200 mL/hr over 30 Minutes Intravenous  Once 01/05/23 1630 01/05/23 1721   01/05/23 1645  vancomycin (VANCOCIN) IVPB 1000 mg/200 mL premix  Status:  Discontinued        1,000 mg 200 mL/hr over 60 Minutes Intravenous  Once 01/05/23 1634 01/07/23 1120              Family Communication/Anticipated D/C date and plan/Code Status   DVT prophylaxis: enoxaparin (LOVENOX) injection 40 mg Start: 01/05/23 2200 Place TED hose Start: 01/05/23  1656     Code Status: DNR  Family Communication: Plan discussed with husband over the phone Disposition Plan: Possible discharge home   Status is: Inpatient Remains inpatient appropriate because: Sacral and right greater trochanter osteomyelitis        Subjective:   Interval events noted.  She has no complaints.  She feels better.  Objective:    Vitals:   01/12/23 1935 01/13/23 0311 01/13/23 0830 01/13/23 0900  BP: (!) 98/59 103/63 122/63  Pulse: 75 72 64 70  Resp: '18 18 18   '$ Temp: 98.5 F (36.9 C) 98.6 F (37 C) 98.3 F (36.8 C)   TempSrc:   Oral   SpO2: 99% 99%  100%  Weight:      Height:       No data found.   Intake/Output Summary (Last 24 hours) at 01/13/2023 1349 Last data filed at 01/13/2023 0408 Gross per 24 hour  Intake 226.14 ml  Output 250 ml  Net -23.86 ml   Filed Weights   01/05/23 1511  Weight: 52.6 kg    Exam:  GEN: NAD SKIN: No rash EYES: EOMI ENT: MMM CV: RRR PULM: CTA B ABD: soft, ND, NT, +BS CNS: AAO x 3, non focal EXT: No edema or tenderness      Data Reviewed:   I have personally reviewed following labs and imaging studies:  Labs: Labs show the following:   Basic Metabolic Panel: Recent Labs  Lab 01/07/23 0621 01/09/23 0302  NA 136 138  K 3.9 3.5  CL 107 108  CO2 24 21*  GLUCOSE 104* 68*  BUN 22 19  CREATININE 0.89 0.80  CALCIUM 7.8* 8.1*  MG 2.0  --   PHOS 2.7  --    GFR Estimated Creatinine Clearance: 42.3 mL/min (by C-G formula based on SCr of 0.8 mg/dL). Liver Function Tests: No results for input(s): "AST", "ALT", "ALKPHOS", "BILITOT", "PROT", "ALBUMIN" in the last 168 hours.  No results for input(s): "LIPASE", "AMYLASE" in the last 168 hours. No results for input(s): "AMMONIA" in the last 168 hours. Coagulation profile No results for input(s): "INR", "PROTIME" in the last 168 hours.   CBC: Recent Labs  Lab 01/07/23 0621 01/08/23 0628 01/09/23 0302  WBC 8.7 11.0* 8.4  HGB 7.9* 9.5* 8.8*   HCT 26.6* 32.2* 29.2*  MCV 81.6 82.6 81.6  PLT 279 333 290   Cardiac Enzymes: Recent Labs  Lab 01/08/23 0628  CKTOTAL 161   BNP (last 3 results) No results for input(s): "PROBNP" in the last 8760 hours. CBG: Recent Labs  Lab 01/09/23 1142 01/09/23 1659 01/09/23 2129 01/10/23 0751 01/10/23 1105  GLUCAP 106* 84 90 117* 127*   D-Dimer: No results for input(s): "DDIMER" in the last 72 hours. Hgb A1c: No results for input(s): "HGBA1C" in the last 72 hours. Lipid Profile: No results for input(s): "CHOL", "HDL", "LDLCALC", "TRIG", "CHOLHDL", "LDLDIRECT" in the last 72 hours. Thyroid function studies: No results for input(s): "TSH", "T4TOTAL", "T3FREE", "THYROIDAB" in the last 72 hours.  Invalid input(s): "FREET3" Anemia work up: No results for input(s): "VITAMINB12", "FOLATE", "FERRITIN", "TIBC", "IRON", "RETICCTPCT" in the last 72 hours. Sepsis Labs: Recent Labs  Lab 01/07/23 [id number] 01/08/23 0628 01/09/23 0302  WBC 8.7 11.0* 8.4    Microbiology Recent Results (from the past 240 hour(s))  Blood culture (routine x 2)     Status: None   Collection Time: 01/05/23  3:29 PM   Specimen: BLOOD  Result Value Ref Range Status   Specimen Description BLOOD RIGHT ANTECUBITAL  Final   Special Requests   Final    BOTTLES DRAWN AEROBIC AND ANAEROBIC Blood Culture adequate volume   Culture   Final    NO GROWTH 5 DAYS Performed at [facility], [address], [place], [place] [address]    Report Status 01/10/2023 FINAL  Final  Blood culture (routine x 2)     Status: None   Collection Time: 01/05/23  4:47 PM   Specimen: BLOOD  Result Value Ref Range Status   Specimen Description BLOOD BLOOD RIGHT HAND  Final   Special Requests   Final    BOTTLES DRAWN AEROBIC AND ANAEROBIC Blood Culture adequate volume   Culture   Final    NO GROWTH 5 DAYS Performed at [facility], [address] [place], [place] [address]    Report Status 01/10/2023 FINAL  Final   MRSA Next Gen by PCR, Nasal     Status: None   Collection Time: 01/06/23 11:31 AM   Specimen: Nasal Mucosa; Nasal Swab  Result Value Ref Range Status   MRSA by PCR Next Gen NOT DETECTED NOT DETECTED Final    Comment: (NOTE) The GeneXpert MRSA Assay (FDA approved for NASAL specimens only), is one component of a comprehensive MRSA colonization surveillance program. It is not intended to diagnose MRSA infection nor to guide or monitor treatment for MRSA infections. Test performance is not FDA approved in patients less than 69 years old. Performed at [facility], [place], [place] [address]   Aerobic Culture w Gram Stain (superficial specimen)     Status: None   Collection Time: 01/10/23  6:12 PM   Specimen: Joint, Hip; Wound  Result Value Ref Range Status   Specimen Description   Final    HIP RIGHT Performed at [facility], [address] [place], [place] [address]    Special Requests   Final    NONE Performed at [facility], [address] [place], [place] [address]    Gram Stain   Final    FEW WBC PRESENT,BOTH PMN AND MONONUCLEAR NO ORGANISMS SEEN    Culture   Final    NO GROWTH Performed at [facility], [place] [address] [place], [place] [address]    Report Status 01/13/2023 FINAL  Final    Procedures and diagnostic studies:  No results found.             LOS: 8 days   Krithik Mapel  [facility]   Pager on [email or web address]. If 7PM-7AM, please contact night-coverage at [email or web address]     01/13/2023, 1:49 PM

## 2023-01-14 DIAGNOSIS — L89213 Pressure ulcer of right hip, stage 3: Secondary | ICD-10-CM | POA: Diagnosis not present

## 2023-01-14 DIAGNOSIS — A419 Sepsis, unspecified organism: Secondary | ICD-10-CM | POA: Diagnosis not present

## 2023-01-14 DIAGNOSIS — M869 Osteomyelitis, unspecified: Secondary | ICD-10-CM

## 2023-01-14 DIAGNOSIS — E440 Moderate protein-calorie malnutrition: Secondary | ICD-10-CM | POA: Diagnosis not present

## 2023-01-14 DIAGNOSIS — L8990 Pressure ulcer of unspecified site, unspecified stage: Secondary | ICD-10-CM | POA: Diagnosis not present

## 2023-01-14 DIAGNOSIS — R6520 Severe sepsis without septic shock: Secondary | ICD-10-CM | POA: Diagnosis not present

## 2023-01-14 DIAGNOSIS — L97522 Non-pressure chronic ulcer of other part of left foot with fat layer exposed: Secondary | ICD-10-CM | POA: Diagnosis not present

## 2023-01-14 MED ORDER — AMOXICILLIN-POT CLAVULANATE 875-125 MG PO TABS
1.0000 | ORAL_TABLET | Freq: Two times a day (BID) | ORAL | Status: DC
  Administered 2023-01-15: 1 via ORAL
  Filled 2023-01-14: qty 1

## 2023-01-14 MED ORDER — MEDIHONEY WOUND/BURN DRESSING EX PSTE
1.0000 | PASTE | Freq: Every day | CUTANEOUS | Status: DC
  Administered 2023-01-15: 1 via TOPICAL
  Filled 2023-01-14: qty 44

## 2023-01-14 NOTE — Consult Note (Signed)
WOC Nurse Consult Note: Reason for Consult: reevaluate sacral wound and right trochanter wounds 01/06/23 WOC saw patient with general surgery, see notes. Appears plans were to go home with palliative and/or hospice care Also recommendation for podiatry if aggressive care desired for foot wounds  Wound type: Pressure injuries in the setting of severe malnutrition; failure to thrive, DM Pressure Injury POA: Yes Measurement: see nursing flow sheets  Dressing procedure/placement/frequency: Per ID request, goal now to be more aggressive with debridement of the right trochanter, I have added Medihoney for autolytic debridement Sacral wound is clean and can remain with saline moist gauze dressings until patient can be seen in a wound care center for serial debridements and potentially use of more aggressive dressings like collagen  or otherwise Offload heels with Prevalon boots at all times.  Silicone foam to the foot and heel wounds Paint heel wound with betadine daily to keep stable and dry  Maximize nutrition, added RD consultation for supplementation  Maintain optimal blood glucose levels if wound healing is to be a GOC at this time

## 2023-01-14 NOTE — Evaluation (Signed)
Occupational Therapy Evaluation Patient Details Name: [name] MRN: [id number] DOB: Sep 05, 1941 Today's Date: 01/14/2023   History of Present Illness Pt is a 82 y/o F admitted on 01/05/23 after presenting to the ED with c/o worsening R hip wound. Pt is being treated for severe sepsis, osteomyelitis of sacrum & R greater trochanter, s/p debridement of R trochanteric decubitus ulcer on 01/06/23. PMH: chronic wounds, gout, HTN, HLD, iron deficiency anemia, acquired hypothyroid, NIDDM   Clinical Impression   [name] presents with deficits in strength, range of motion, endurance, balance, and cognition that impacts her ability to safely and independently complete ADLs.  Prior to admission, patient received assistance for most ADLs from her husband and/or [place] aide.  Per chart and patient report, patient currently requires increased level of assist for mobility and transfers.  Patient is able to complete UB ADLs at bedlevel with setup assist.  OT provided significant setup assist for self-feeding, including mod assist for bed mobility and cutting food, opening packages.  Patient able to self-feed without physical assist from OT.  Patient declined further mobility 2/2 pain and fatigue, but requires mod-max A for bed mobility and stand pivot transfers per PT report.  Patient will likely benefit from continued skilled OT services in acute setting to support functional strengthening, endurance, safety and independence in ADLs.  If patient's husband is comfortable with the level of physical assist required, recommend HHOT upon discharge to best support patient's goals.       Recommendations for follow up therapy are one component of a multi-disciplinary discharge planning process, led by the attending physician.  Recommendations may be updated based on patient status, additional functional criteria and insurance authorization.   Follow Up Recommendations  Home health OT (Return to ALF with assist from  Spouse, [place], [place] aide, and Wound care nurse.)     Assistance Recommended at Discharge Frequent or constant Supervision/Assistance  Patient can return home with the following Two people to help with walking and/or transfers;A lot of help with bathing/dressing/bathroom;Assistance with cooking/housework;Assist for transportation;Help with stairs or ramp for entrance;Direct supervision/assist for financial management;Direct supervision/assist for medications management    Functional Status Assessment  Patient has had a recent decline in their functional status and demonstrates the ability to make significant improvements in function in a reasonable and predictable amount of time.  Equipment Recommendations  None recommended by OT    Recommendations for Other Services       Precautions / Restrictions Precautions Precautions: Fall Restrictions Weight Bearing Restrictions: No      Mobility Bed Mobility Overal bed mobility: Needs Assistance               Patient Response: Cooperative  Transfers                          Balance Overall balance assessment: Needs assistance                                         ADL either performed or assessed with clinical judgement   ADL Overall ADL's : Needs assistance/impaired Eating/Feeding: Set up;Sitting Eating/Feeding Details (indicate cue type and reason): assistance provided for opening small containers, cutting food Grooming: Set up;Sitting;Supervision/safety  General ADL Comments: Anticipate max assist for bed mobility and stand pivot transfers, as well as heavy assist for LB bathing and dressing.     Vision Patient Visual Report: No change from baseline       Perception     Praxis      Pertinent Vitals/Pain Pain Assessment Pain Assessment: 0-10 Pain Score: 6  Pain Intervention(s): Limited activity within patient's tolerance, Monitored during  session, Repositioned     Hand Dominance Right   Extremity/Trunk Assessment Upper Extremity Assessment Upper Extremity Assessment: Generalized weakness;LUE deficits/detail LUE Deficits / Details: decreased shoulder flexion (~45 degrees) due to previous injury LUE: Shoulder pain with ROM   Lower Extremity Assessment Lower Extremity Assessment:  (Pt with dressings on B heels; pt unable to fully extend BLE knees ~15 degrees from full extension.)   Cervical / Trunk Assessment Cervical / Trunk Assessment: Kyphotic   Communication Communication Communication: HOH   Cognition Arousal/Alertness: Awake/alert Behavior During Therapy: WFL for tasks assessed/performed, Anxious Overall Cognitive Status: No family/caregiver present to determine baseline cognitive functioning                                 General Comments: Oriented to year, decreased orientation to date (answered Feb 11 vs March 4, Friday vs Monday).  Decreased orientation to situation.  Oriented to self and setting.  Short term memory deficits noted.     General Comments  Pt reports need to use bathroom & has continent BM on BSC, requires total assist for peri hygiene.    Exercises Other Exercises Other Exercises: provided education re: OT role and plan of care, fall and safety precautions, self care, orientation to room and situation, setup assist for self-feeding   Shoulder Instructions      Home Living Family/patient expects to be discharged to:: Assisted living                             Home Equipment: BSC/3in1;Shower seat - built in;Toilet riser;Hospital bed;Rolling Walker (2 wheels);Wheelchair - manual   Additional Comments: Pt reports she lives at [facility], which is an ALF that she & her husband own, but they are planning to sell.      Prior Functioning/Environment Prior Level of Function : Needs assist  Cognitive Assist : ADLs (cognitive)   ADLs  (Cognitive): Intermittent cues Physical Assist : Mobility (physical);ADLs (physical) Mobility (physical): Bed mobility;Transfers;Gait;Stairs ADLs (physical): Grooming;Bathing;Dressing;Toileting;IADLs Mobility Comments: Uses wheelchair for ambulation, husband provides assist for stand pivot transfers ADLs Comments: Pt was going to wound clinic every week and [place] was coming 2x/wk for wound dressings. Son and Husband very involved in pt care, assist with ADLs/IADLs as needed (including transportation, med mgmt). Pt reports [place] aide comes every other day for a couple hours to assist with bathing/dressing.        OT Problem List: Decreased strength;Decreased activity tolerance;Impaired balance (sitting and/or standing);Pain;Decreased range of motion;Decreased safety awareness;Decreased cognition      OT Treatment/Interventions: Self-care/ADL training;Therapeutic exercise;Therapeutic activities;Cognitive remediation/compensation;Energy conservation;DME and/or AE instruction;Patient/family education;Balance training    OT Goals(Current goals can be found in the care plan section) Acute Rehab OT Goals Patient Stated Goal: return home OT Goal Formulation: With patient Time For Goal Achievement: 01/28/23 Potential to Achieve Goals: Fair  OT Frequency: Min 2X/week    Co-evaluation  AM-PAC OT "6 Clicks" Daily Activity     Outcome Measure Help from another person eating meals?: A Little Help from another person taking care of personal grooming?: A Little Help from another person toileting, which includes using toliet, bedpan, or urinal?: A Lot Help from another person bathing (including washing, rinsing, drying)?: A Lot Help from another person to put on and taking off regular upper body clothing?: A Little Help from another person to put on and taking off regular lower body clothing?: A Lot 6 Click Score: 15   End of Session Equipment Utilized During Treatment: Gait  belt  Activity Tolerance: Patient limited by fatigue Patient left: with call bell/phone within reach;in bed;with bed alarm set  OT Visit Diagnosis: Other abnormalities of gait and mobility (R26.89);Muscle weakness (generalized) (M62.81);Pain Pain - Right/Left: Right Pain - part of body: Hip;Ankle and joints of foot                Time: 1325-1356 OT Time Calculation (min): 31 min Charges:  OT General Charges $OT Visit: 1 Visit OT Evaluation $OT Eval Moderate Complexity: 1 Mod OT Treatments $Self Care/Home Management : 8-22 mins  Jeneen Montgomery, OTR/L 01/14/23, 3:54 PM

## 2023-01-14 NOTE — Care Management Important Message (Signed)
Important Message  Patient Details  Name: [name] MRN: [id number] Date of Birth: 04/20/1941   Medicare Important Message Given:  Yes     Loann Quill 01/14/2023, 4:01 PM

## 2023-01-14 NOTE — TOC Progression Note (Signed)
Transition of Care [facility]) - Progression Note    Patient Details  Name: [name] MRN: [id number] Date of Birth: 03-10-1941  Transition of [facility]) CM/SW Contact  Beverly Sessions, RN Phone Number: 01/14/2023, 3:48 PM  Clinical Narrative:     Per MD still on IV antibiotics while inpatient. Awaiting recs from ID       Expected Discharge Plan and Services         Expected Discharge Date: 01/11/23                                     Social Determinants of Health (SDOH) Interventions SDOH Screenings   Food Insecurity: No Food Insecurity (12/20/2022)  Housing: Low Risk  (12/20/2022)  Transportation Needs: No Transportation Needs (12/20/2022)  Utilities: Not At Risk (12/20/2022)  Depression (PHQ2-9): Low Risk  (01/29/2019)  Tobacco Use: Low Risk  (12/20/2022)    Readmission Risk Interventions    12/24/2022   11:39 AM 11/07/2022    4:05 PM 08/24/2022   10:41 AM  Readmission Risk Prevention Plan  Transportation Screening Complete Complete Complete  PCP or Specialist Appt within 3-5 Days   Complete  HRI or [place]   Not Complete  HRI or Home Care Consult comments   SW will arrange this admission  Social Work Consult for [place] Planning/Counseling   Complete  Palliative Care Screening   Not Applicable  Medication Review [employer] photographer) Complete Complete Complete  HRI or [place] Not Applicable Complete

## 2023-01-14 NOTE — Evaluation (Signed)
Physical Therapy Evaluation Patient Details Name: [name] MRN: [id number] DOB: 09-Oct-1941 Today's Date: 01/14/2023  History of Present Illness  Pt is a 82 y/o F admitted on 01/05/23 after presenting to the ED with c/o worsening R hip wound. Pt is being treated for severe sepsis, osteomyelitis of sacrum & R greater trochanter, s/p debridement of R trochanteric decubitus ulcer on 01/06/23. PMH: chronic wounds, gout, HTN, HLD, iron deficiency anemia, acquired hypothyroid, NIDDM  Clinical Impression  Pt seen for PT evaluation with pt agreeable. Pt reports prior to admission she was residing at the ALF that her & her husband own. Pt notes she hasn't been OOB in a month prior to admission, but when she did get to w/c her husband assisted her with stand pivot without AD. On this date, pt requires mod assist for bed mobility, max assist for stand pivot bed>BSC>recliner. PT provides cuing for hand placement, sequencing, assistance to power up to clear buttocks as well as to pivot. Pt also requires assistance to fully scoot back in recliner. Pt unable to fully extend BLE, even PROM. Pt left in recliner with all needs met, BLE in prevalon boots to reduce skin breakdown.       Recommendations for follow up therapy are one component of a multi-disciplinary discharge planning process, led by the attending physician.  Recommendations may be updated based on patient status, additional functional criteria and insurance authorization.  Follow Up Recommendations Home health PT      Assistance Recommended at Discharge Frequent or constant Supervision/Assistance  Patient can return home with the following  Two people to help with walking and/or transfers;Two people to help with bathing/dressing/bathroom;Help with stairs or ramp for entrance;Assist for transportation;Direct supervision/assist for financial management;Assistance with cooking/housework;Assistance with feeding    Equipment Recommendations None  recommended by PT  Recommendations for Other Services       Functional Status Assessment Patient has had a recent decline in their functional status and demonstrates the ability to make significant improvements in function in a reasonable and predictable amount of time.     Precautions / Restrictions Precautions Precautions: Fall Restrictions Weight Bearing Restrictions: No      Mobility  Bed Mobility Overal bed mobility: Needs Assistance Bed Mobility: Supine to Sit     Supine to sit: Mod assist, HOB elevated          Transfers Overall transfer level: Needs assistance Equipment used: None Transfers: Bed to chair/wheelchair/BSC   Stand pivot transfers: Max assist         General transfer comment: cuing re: hand placement, assistance for powering up & clearing buttocks, as well pivoting    Ambulation/Gait                  Stairs            Wheelchair Mobility    Modified Rankin (Stroke Patients Only)       Balance Overall balance assessment: Needs assistance Sitting-balance support: Bilateral upper extremity supported, Feet supported Sitting balance-Leahy Scale: Poor     Standing balance support: During functional activity, Bilateral upper extremity supported Standing balance-Leahy Scale: Zero                               Pertinent Vitals/Pain Pain Assessment Pain Assessment: Faces Faces Pain Scale: No hurt    Home Living  Additional Comments: Pt reports she lives at [facility], which is an ALF that she & her husband own, but they are planning to sell.    Prior Function Prior Level of Function : Needs assist             Mobility Comments: Pt reports she has not been OOB at home in a month. Husband assists with gait belt & stand pivot without AD.       Hand Dominance        Extremity/Trunk Assessment   Upper Extremity Assessment Upper Extremity Assessment:  Generalized weakness    Lower Extremity Assessment Lower Extremity Assessment: Generalized weakness (Pt with dressings on B heels; pt unable to fully extend BLE knees ~15 degrees from full extension.)    Cervical / Trunk Assessment Cervical / Trunk Assessment: Kyphotic  Communication   Communication: HOH  Cognition Arousal/Alertness: Awake/alert Behavior During Therapy: WFL for tasks assessed/performed, Anxious Overall Cognitive Status: No family/caregiver present to determine baseline cognitive functioning                                 General Comments: multimodal cuing for hand placement/sequencing transfers & safety        General Comments General comments (skin integrity, edema, etc.): Pt reports need to use bathroom & has continent BM on BSC, requires total assist for peri hygiene.    Exercises     Assessment/Plan    PT Assessment Patient needs continued PT services  PT Problem List Decreased strength;Cardiopulmonary status limiting activity;Decreased balance;Decreased mobility;Decreased activity tolerance;Decreased knowledge of use of DME;Decreased safety awareness;Decreased knowledge of precautions;Decreased skin integrity;Decreased range of motion       PT Treatment Interventions DME instruction;Therapeutic exercise;Wheelchair mobility training;Gait training;Balance training;Functional mobility training;Patient/family education;Therapeutic activities;Cognitive remediation;Neuromuscular re-education;Manual techniques;Modalities    PT Goals (Current goals can be found in the Care Plan section)  Acute Rehab PT Goals Patient Stated Goal: none stated PT Goal Formulation: With patient Time For Goal Achievement: 01/28/23 Potential to Achieve Goals: Fair    Frequency Min 2X/week     Co-evaluation               AM-PAC PT "6 Clicks" Mobility  Outcome Measure Help needed turning from your back to your side while in a flat bed without using  bedrails?: A Lot Help needed moving from lying on your back to sitting on the side of a flat bed without using bedrails?: A Lot Help needed moving to and from a bed to a chair (including a wheelchair)?: Total Help needed standing up from a chair using your arms (e.g., wheelchair or bedside chair)?: Total Help needed to walk in hospital room?: Total Help needed climbing 3-5 steps with a railing? : Total 6 Click Score: 8    End of Session   Activity Tolerance: Patient tolerated treatment well Patient left: in chair;with call bell/phone within reach Nurse Communication: Mobility status PT Visit Diagnosis: Muscle weakness (generalized) (M62.81);Difficulty in walking, not elsewhere classified (R26.2);Other abnormalities of gait and mobility (R26.89)    Time: [id number] PT Time Calculation (min) (ACUTE ONLY): 25 min   Charges:   PT Evaluation $PT Eval Moderate Complexity: 1 Mod PT Treatments $Therapeutic Activity: 8-22 mins        Lavone Nian, PT, DPT 01/14/23, 3:29 PM   Waunita Schooner 01/14/2023, 3:27 PM

## 2023-01-14 NOTE — Progress Notes (Signed)
Nutrition Follow-up  DOCUMENTATION CODES:   Non-severe (moderate) malnutrition in context of chronic illness  INTERVENTION:   -Continue regular diet to regular for widest variety of meal selections -Continue MVI with minerals daily -Continue Magic cup TID with meals, each supplement provides 290 kcal and 9 grams of protein  -Continue 500 mg vitamin C BID -Continue 220 mg zinc sulfate daily x 14 days -Continue feeding assistance with meals -Encourage adequate oral intake; ok for family/ visitors to being in outside food  NUTRITION DIAGNOSIS:   Moderate Malnutrition related to chronic illness (DM, chronic wounds) as evidenced by mild fat depletion, moderate fat depletion, mild muscle depletion, moderate muscle depletion.  Ongoing  GOAL:   Patient will meet greater than or equal to 90% of their needs  Progressing   MONITOR:   PO intake, Supplement acceptance  REASON FOR ASSESSMENT:   Consult Assessment of nutrition requirement/status  ASSESSMENT:   Pt with history of chronic wounds, gout, hypertension, hyperlipidemia, iron deficiency anemia, acquired hypothyroid, non-insulin-dependent diabetes mellitus, who presented to the ED on evening of 01/05/23 for evaluation of worsening right hip wound.  Patient's wound nurse reportedly concerned of progressive signs of infection over prior 2-3 days.  Patient reports significant pain related to the hip and sacral wounds.  2/25- rt hip wound debrided at bedside per general surgery   Reviewed I/O's: -2.2 L x 24 hours and --1.3 L since admission  UOP: 2.5 L x 24 hours   Pt lying in bed at time of visit. She did not respond to voice. No family at bedside.   Per RN notes, pt has poor appetite. Pt son brought pt food last night, but pt only consumed about 25%. Pt is drinking Ensure supplements.   Medications reviewed and include vitamin C, vitamin B-12, zosyn, and zinc sulfate.   Per MD notes, pt no longer wants hospice and plan is  to discharge pt home with home health. Plan to to await wound culture results prior to discharge.   Pt with poor oral intake, malnutrition, and would benefit from nutrient dense supplement. One Ensure Enlive supplement provides 350 kcals, 20 grams protein, and 44-45 grams of carbohydrate vs one Glucerna shake supplement, which provides 220 kcals, 10 grams of protein, and 26 grams of carbohydrate. Given pt's hx of DM, RD will reassess adequacy of PO intake, CBGS, and adjust supplement regimen as appropriate at follow-up.  Pt on multiple supplements as well as vitamins to help support wound healing. Regular diet has also been ordered to help optimize oral intake. Will continue above interventions, as pt continues with good glycemic control.   No new wt since admission. RD will order another wt to better assess wt changes.   Labs reviewed: CBGS: 90-127 (inpatient orders for glycemic control are none).    Diet Order:   Diet Order             Diet general           Diet regular Room service appropriate? No; Fluid consistency: Thin  Diet effective now                   EDUCATION NEEDS:   Not appropriate for education at this time  Skin:  Skin Assessment: Skin Integrity Issues: Skin Integrity Issues:: Stage IV, Unstageable, Stage II, Stage III, Other (Comment) Stage II: lt foot Stage III: rt heel Stage IV: sacrum Unstageable: lt heel Other: venous stasis ulcer to rt heel with eschar, rt hip pressure injury  Last BM:  01/13/23 (type 5)  Height:   Ht Readings from Last 1 Encounters:  01/05/23 '4\' 11"'$  (1.499 m)    Weight:   Wt Readings from Last 1 Encounters:  01/05/23 52.6 kg    Ideal Body Weight:  44.7 kg  BMI:  Body mass index is 23.43 kg/m.  Estimated Nutritional Needs:   Kcal:  1800-2000  Protein:  105-120 grams  Fluid:  > 1.8 L    Loistine Chance, RD, LDN, [place] Registered Dietitian II Certified Diabetes Care and Education Specialist Please refer to [facility] for RD  and/or RD on-call/weekend/after hours pager

## 2023-01-14 NOTE — Progress Notes (Addendum)
   Date of Admission:  01/05/2023    ID: [name] is a 82 y.o. female  Principal Problem:   Severe sepsis ([place]) Active Problems:   Diabetes mellitus, type 2 ([place])   HLD (hyperlipidemia)   Cancer of right breast ([place])   PAD (peripheral artery disease) ([place])   Lymphedema   Acquired hypothyroidism   Benign essential hypertension   History of mastectomy, right   Chronic diastolic CHF (congestive heart failure) (H[facility])   Malnutrition of moderate degree   Pressure ulcers of skin of multiple topographic sites   Chronic ulcer of great toe of left foot with fat layer exposed ([place])   CKD (chronic kidney disease) stage 2, GFR 60-89 ml/min   Ulcer of both feet, limited to breakdown of skin (HCC)   Leukocytosis   Wound infection   Anemia of chronic disease   Decubitus ulcer of trochanter, right, stage III (HCC)   Hypoalbuminemia   Osteomyelitis of right hip (HCC)    Subjective: Pt is feeling better Alert Working with PT-  Sat on the [employer] with help of PT  Medications:   acetaminophen  1,000 mg Oral Q8H   allopurinol  300 mg Oral Daily   [START ON 01/15/2023] amoxicillin-clavulanate  1 tablet Oral Q12H   vitamin C  500 mg Oral BID   atenolol  50 mg Oral Daily   atorvastatin  40 mg Oral Daily   Chlorhexidine Gluconate Cloth  6 each Topical Daily   cyanocobalamin  1,000 mcg Oral Daily   enoxaparin (LOVENOX) injection  40 mg Subcutaneous Q24H   feeding supplement  237 mL Oral TID BM   leptospermum manuka honey  1 Application Topical Daily   levothyroxine  88 mcg Oral Q0600   lisinopril  5 mg Oral Daily   multivitamin with minerals  1 tablet Oral Daily   zinc sulfate  220 mg Oral Daily    Objective: Vital signs in last 24 hours: Temp:  [97.8 F (36.6 C)-98.6 F (37 C)] 97.8 F (36.6 C) (03/04 1945) Pulse Rate:  [72-88] 76 (03/04 1945) Resp:  [15-18] 15 (03/04 1945) BP: (130-151)/(71-85) 130/71 (03/04 1945) SpO2:  [95 %-100 %] 95 % (03/04 1945)    PHYSICAL EXAM:   General: Alert, cooperative, emaciated Lungs: b/la ir entry. Heart: Regular rate and rhythm, no murmur, rub or gallop. Abdomen: Soft, non-tender,not distended. Bowel sounds normal. No masses Skin- rt hip pressure wound with eschar Sarcum stage 3 decub B/l heel stage 3     Lymph: Cervical, supraclavicular normal. Neurologic: Grossly non-focal    Microbiology: 01/05/23- BC ng WC- NO GROWTH  Assessment/Plan: Multiple pressure ulcers with infection involving the sacrum, right hip at the greater trochanter area, bilateral heels Seen by surgery not a candidate for intervention because of  frailty On dapto and zosyn and improving Culture from the necrotic wound rt hip =no bacteria Will change to oral augmentin for total 4 weeks- 02/03/23  For the wound to heal, it needs to be offloaded, she has to improve her mobility, nutrition has to be improved as well   Anemia    Malnutrition/hypoalbuminemia  H/o ca breast- rt mastectomy  Discussed the management with patient and care team

## 2023-01-14 NOTE — Progress Notes (Signed)
Patient is alert and oriented x3. Son brought her food last night but she had a poor appetite and only ate about 25% of food. All dressings have been changed. Patient had a small bowel movement last night. CHG bath and foley care completed.

## 2023-01-14 NOTE — Progress Notes (Addendum)
Progress Note    [name]  [id number] DOB: February 06, 1941  DOA: 01/05/2023 PCP: Tracie Harrier, MD      Brief Narrative:    Medical records reviewed and are as summarized below:   [name] is a 82 year old female with history of chronic wounds, gout, hypertension, hyperlipidemia, iron deficiency anemia, acquired hypothyroid, non-insulin-dependent diabetes mellitus, who presented to the ED on evening of 01/05/23 for evaluation of worsening right hip wound.  Patient's wound nurse reportedly concerned of progressive signs of infection over prior 2-3 days.  Patient reports significant pain related to the hip and sacral wounds.  Admitted to medicine service with general surgery consulted for debridement.    Started on empiric IV Vancomycin and Cefepime for empiric broad spectrum coverage.  Infectious disease consulted.  Antibiotics were changed to IV Daptomycin and Zosyn.  Palliative Care consulted for goals of care discussions.  Patient is debilitated, malnourished, chronically ill.  [name] has poor long term prognosis and is high risk for morbidity and mortality.        Assessment/Plan:   Principal Problem:   Severe sepsis ([place]) Active Problems:   Cancer of right breast (HCC)   Wound infection   Chronic diastolic CHF (congestive heart failure) (HCC)   Anemia of chronic disease   Chronic ulcer of great toe of left foot with fat layer exposed (HCC)   Benign essential hypertension   HLD (hyperlipidemia)   Acquired hypothyroidism   CKD (chronic kidney disease) stage 2, GFR 60-89 ml/min   Ulcer of both feet, limited to breakdown of skin (HCC)   Pressure ulcers of skin of multiple topographic sites   Diabetes mellitus, type 2 (HCC)   PAD (peripheral artery disease) (HCC)   Lymphedema   History of mastectomy, right   Malnutrition of moderate degree   Leukocytosis   Decubitus ulcer of trochanter, right, stage III (HCC)   Hypoalbuminemia    Osteomyelitis of right hip (HCC)   Nutrition Problem: Moderate Malnutrition Etiology: chronic illness (DM, chronic wounds)  Signs/Symptoms: mild fat depletion, moderate fat depletion, mild muscle depletion, moderate muscle depletion   Body mass index is 23.43 kg/m.   Severe sepsis, osteomyelitis of sacrum and right greater trochanter: S/p debridement of right trochanteric decubitus ulcer on 01/06/2023.  [name] is still on IV daptomycin and IV Zosyn.  No growth on blood cultures and no growth from wound culture.  Analgesics as needed for pain.  Awaiting recommendations from ID specialist.   Hypertension: BP is better.  Continue atenolol and lisinopril   Chronic left great toe ulcer, bilateral foot wounds: Continue local wound care   Other comorbidities include hypoalbuminemia, hypertension, hyperlipidemia, hypothyroidism, CKD stage II, history of right breast cancer s/p mastectomy, chronic lymphedema of lower extremities, type II DM, PVD   Hypotension: Resolved   Diet Order             Diet general           Diet regular Room service appropriate? No; Fluid consistency: Thin  Diet effective now                            Consultants: ID specialist  Procedures: None    Medications:    acetaminophen  1,000 mg Oral Q8H   allopurinol  300 mg Oral Daily   vitamin C  500 mg Oral BID   atenolol  50 mg Oral Daily   atorvastatin  40 mg  Oral Daily   Chlorhexidine Gluconate Cloth  6 each Topical Daily   cyanocobalamin  1,000 mcg Oral Daily   enoxaparin (LOVENOX) injection  40 mg Subcutaneous Q24H   feeding supplement  237 mL Oral TID BM   levothyroxine  88 mcg Oral Q0600   lisinopril  5 mg Oral Daily   multivitamin with minerals  1 tablet Oral Daily   zinc sulfate  220 mg Oral Daily   Continuous Infusions:  DAPTOmycin (CUBICIN) 400 mg in sodium chloride 0.9 % IVPB Stopped (01/13/23 2142)   piperacillin-tazobactam (ZOSYN)  IV 3.375 g (01/14/23 0533)      Anti-infectives (From admission, onward)    Start     Dose/Rate Route Frequency Ordered Stop   01/09/23 0000  vancomycin (VANCOCIN) IVPB 1000 mg/200 mL premix  Status:  Discontinued        1,000 mg 200 mL/hr over 60 Minutes Intravenous Every 36 hours 01/07/23 1308 01/07/23 1557   01/08/23 2000  DAPTOmycin (CUBICIN) 400 mg in sodium chloride 0.9 % IVPB        8 mg/kg  52.6 kg 116 mL/hr over 30 Minutes Intravenous Daily 01/07/23 1617     01/08/23 0600  piperacillin-tazobactam (ZOSYN) IVPB 3.375 g        3.375 g 12.5 mL/hr over 240 Minutes Intravenous Every 8 hours 01/07/23 1617     01/07/23 2200  ceFEPIme (MAXIPIME) 2 g in sodium chloride 0.9 % 100 mL IVPB        2 g 200 mL/hr over 30 Minutes Intravenous  Once 01/07/23 1617 01/07/23 2300   01/07/23 1800  vancomycin (VANCOREADY) IVPB 1250 mg/250 mL  Status:  Discontinued        1,250 mg 166.7 mL/hr over 90 Minutes Intravenous Every 48 hours 01/05/23 1708 01/07/23 1120   01/07/23 1215  vancomycin (VANCOREADY) IVPB 1250 mg/250 mL        1,250 mg 166.7 mL/hr over 90 Minutes Intravenous  Once 01/07/23 1125 01/07/23 1530   01/06/23 1000  fluconazole (DIFLUCAN) tablet 100 mg        100 mg Oral Daily 01/05/23 1652 01/11/23 0817   01/05/23 2200  ceFEPIme (MAXIPIME) 2 g in sodium chloride 0.9 % 100 mL IVPB  Status:  Discontinued        2 g 200 mL/hr over 30 Minutes Intravenous Every 12 hours 01/05/23 1708 01/07/23 1557   01/05/23 1645  Ampicillin-Sulbactam (UNASYN) 3 g in sodium chloride 0.9 % 100 mL IVPB        3 g 200 mL/hr over 30 Minutes Intravenous  Once 01/05/23 1630 01/05/23 1721   01/05/23 1645  vancomycin (VANCOCIN) IVPB 1000 mg/200 mL premix  Status:  Discontinued        1,000 mg 200 mL/hr over 60 Minutes Intravenous  Once 01/05/23 1634 01/07/23 1120              Family Communication/Anticipated D/C date and plan/Code Status   DVT prophylaxis: enoxaparin (LOVENOX) injection 40 mg Start: 01/05/23 2200 Place TED  hose Start: 01/05/23 1656     Code Status: DNR  Family Communication: Plan discussed with the husband at the bedside Disposition Plan: Possible discharge home   Status is: Inpatient Remains inpatient appropriate because: Sacral and right greater trochanter osteomyelitis        Subjective:   Interval events noted.  [name] complains of pain from the right hip wound.  [name] husband was at the bedside.  Objective:    Vitals:   01/13/23 2107  01/13/23 2134 01/14/23 0356 01/14/23 0732  BP: (!) 147/79  (!) 144/74 137/74  Pulse: 83 88 74 72  Resp: '18  18 15  '$ Temp: 98.6 F (37 C)  98.5 F (36.9 C) 98.1 F (36.7 C)  TempSrc: Oral  Oral Oral  SpO2:  95% 100% 100%  Weight:      Height:       No data found.   Intake/Output Summary (Last 24 hours) at 01/14/2023 1107 Last data filed at 01/14/2023 0902 Gross per 24 hour  Intake 298 ml  Output 2850 ml  Net -2552 ml   Filed Weights   01/05/23 1511  Weight: 52.6 kg    Exam:  GEN: NAD SKIN: Decubitus ulcers on right hip and sacrum EYES: EOMI ENT: MMM CV: RRR PULM: CTA B ABD: soft, ND, NT, +BS CNS: AAO x 3, non focal EXT: No edema or tenderness     Data Reviewed:   I have personally reviewed following labs and imaging studies:  Labs: Labs show the following:   Basic Metabolic Panel: Recent Labs  Lab 01/09/23 0302  NA 138  K 3.5  CL 108  CO2 21*  GLUCOSE 68*  BUN 19  CREATININE 0.80  CALCIUM 8.1*   GFR Estimated Creatinine Clearance: 42.3 mL/min (by C-G formula based on SCr of 0.8 mg/dL). Liver Function Tests: No results for input(s): "AST", "ALT", "ALKPHOS", "BILITOT", "PROT", "ALBUMIN" in the last 168 hours.  No results for input(s): "LIPASE", "AMYLASE" in the last 168 hours. No results for input(s): "AMMONIA" in the last 168 hours. Coagulation profile No results for input(s): "INR", "PROTIME" in the last 168 hours.   CBC: Recent Labs  Lab 01/08/23 0628 01/09/23 0302  WBC 11.0* 8.4  HGB 9.5*  8.8*  HCT 32.2* 29.2*  MCV 82.6 81.6  PLT 333 290   Cardiac Enzymes: Recent Labs  Lab 01/08/23 0628  CKTOTAL 161   BNP (last 3 results) No results for input(s): "PROBNP" in the last 8760 hours. CBG: Recent Labs  Lab 01/09/23 1142 01/09/23 1659 01/09/23 2129 01/10/23 0751 01/10/23 1105  GLUCAP 106* 84 90 117* 127*   D-Dimer: No results for input(s): "DDIMER" in the last 72 hours. Hgb A1c: No results for input(s): "HGBA1C" in the last 72 hours. Lipid Profile: No results for input(s): "CHOL", "HDL", "LDLCALC", "TRIG", "CHOLHDL", "LDLDIRECT" in the last 72 hours. Thyroid function studies: No results for input(s): "TSH", "T4TOTAL", "T3FREE", "THYROIDAB" in the last 72 hours.  Invalid input(s): "FREET3" Anemia work up: No results for input(s): "VITAMINB12", "FOLATE", "FERRITIN", "TIBC", "IRON", "RETICCTPCT" in the last 72 hours. Sepsis Labs: Recent Labs  Lab 01/08/23 0628 01/09/23 0302  WBC 11.0* 8.4    Microbiology Recent Results (from the past 240 hour(s))  Blood culture (routine x 2)     Status: None   Collection Time: 01/05/23  3:29 PM   Specimen: BLOOD  Result Value Ref Range Status   Specimen Description BLOOD RIGHT ANTECUBITAL  Final   Special Requests   Final    BOTTLES DRAWN AEROBIC AND ANAEROBIC Blood Culture adequate volume   Culture   Final    NO GROWTH 5 DAYS Performed at [facility], [address], [place], [place] [address]    Report Status 01/10/2023 FINAL  Final  Blood culture (routine x 2)     Status: None   Collection Time: 01/05/23  4:47 PM   Specimen: BLOOD  Result Value Ref Range Status   Specimen Description BLOOD BLOOD RIGHT HAND  Final   Special Requests   Final    BOTTLES DRAWN AEROBIC AND ANAEROBIC Blood Culture adequate volume   Culture   Final    NO GROWTH 5 DAYS Performed at [facility], [address] [place], [place] [address]    Report Status 01/10/2023 FINAL  Final  MRSA Next Gen by PCR, Nasal      Status: None   Collection Time: 01/06/23 11:31 AM   Specimen: Nasal Mucosa; Nasal Swab  Result Value Ref Range Status   MRSA by PCR Next Gen NOT DETECTED NOT DETECTED Final    Comment: (NOTE) The GeneXpert MRSA Assay (FDA approved for NASAL specimens only), is one component of a comprehensive MRSA colonization surveillance program. It is not intended to diagnose MRSA infection nor to guide or monitor treatment for MRSA infections. Test performance is not FDA approved in patients less than 79 years old. Performed at [facility], [place], [place] [address]   Aerobic Culture w Gram Stain (superficial specimen)     Status: None   Collection Time: 01/10/23  6:12 PM   Specimen: Joint, Hip; Wound  Result Value Ref Range Status   Specimen Description   Final    HIP RIGHT Performed at [facility], [address], [place], [place] [address]    Special Requests   Final    NONE Performed at [facility], [address] [place], [place] [address]    Gram Stain   Final    FEW WBC PRESENT,BOTH PMN AND MONONUCLEAR NO ORGANISMS SEEN    Culture   Final    NO GROWTH Performed at [facility], [place] [address] [place], [place] [address]    Report Status 01/13/2023 FINAL  Final    Procedures and diagnostic studies:  No results found.             LOS: 9 days   Amauri Keefe  [employer], advertising on [email or web address]. If 7PM-7AM, please contact night-coverage at [email or web address]     01/14/2023, 11:07 AM

## 2023-01-15 DIAGNOSIS — A419 Sepsis, unspecified organism: Secondary | ICD-10-CM | POA: Diagnosis not present

## 2023-01-15 DIAGNOSIS — R6520 Severe sepsis without septic shock: Secondary | ICD-10-CM | POA: Diagnosis not present

## 2023-01-15 DIAGNOSIS — M869 Osteomyelitis, unspecified: Secondary | ICD-10-CM | POA: Diagnosis not present

## 2023-01-15 LAB — CREATININE, SERUM
Creatinine, Ser: 0.69 mg/dL (ref 0.44–1.00)
GFR, Estimated: >60 mL/min (ref >60)

## 2023-01-15 LAB — CK: Total CK: 87 U/L (ref 38–234)

## 2023-01-15 MED ORDER — MEDIHONEY WOUND/BURN DRESSING EX PSTE: 1.0000 | PASTE | Freq: Every day | CUTANEOUS | 0 refills | Status: AC

## 2023-01-15 MED ORDER — AMOXICILLIN-POT CLAVULANATE 875-125 MG PO TABS: 1.0000 | ORAL_TABLET | Freq: Two times a day (BID) | ORAL | 0 refills | Status: AC

## 2023-01-15 NOTE — TOC Transition Note (Addendum)
Transition of Care [facility]) - CM/SW Discharge Note   Patient Details  Name: [name] MRN: [id number] Date of Birth: 03-11-1941  Transition [facility] Care [facility]) CM/SW Contact:  Candie Chroman, LCSW Phone Number: 01/15/2023, 12:15 PM   Clinical Narrative:  Patient has orders to discharge home today. Left message for [facility] liaison to notify. No further concerns. CSW signing off.   3:47 pm: Patient will need EMS transport home. Made her aware that she will likely receive a bill. Address on facesheet is correct. Husband confirmed he will be home the rest of the day. EMS transport has been arranged and she is 2nd on the list. CSW signing off.  Final next level of care: [place] Barriers to Discharge: Barriers Resolved   Patient Goals and CMS Choice      Discharge Placement                      Patient and family notified of of transfer: 01/15/23  Discharge Plan and Services Additional resources added to the After Visit Summary for                            [facility] Arranged: RN, PT, OT, Nurse's Aide [place] Agency: [place] Date [place]: 01/15/23   Representative spoke with at [place]: Adela Lank  Social Determinants of Health (SDOH) Interventions SDOH Screenings   Food Insecurity: No Food Insecurity (01/14/2023)  Housing: Low Risk  (01/14/2023)  Transportation Needs: No Transportation Needs (01/14/2023)  Utilities: Not At Risk (01/14/2023)  Depression (PHQ2-9): Low Risk  (01/29/2019)  Tobacco Use: Low Risk  (12/20/2022)     Readmission Risk Interventions    12/24/2022   11:39 AM 11/07/2022    4:05 PM 08/24/2022   10:41 AM  Readmission Risk Prevention Plan  Transportation Screening Complete Complete Complete  PCP or Specialist Appt within 3-5 Days   Complete  HRI or [place]   Not Complete  HRI or Home Care Consult comments   SW will arrange this admission  Social Work Consult for [place]  Planning/Counseling   Complete  Palliative Care Screening   Not Applicable  Medication Review Press photographer) Complete Complete Complete  HRI or [place] Not Applicable Complete

## 2023-01-15 NOTE — Discharge Summary (Signed)
Physician Discharge Summary   Patient: [name] MRN: [id number] DOB: [birth year]  Admit date:     01/05/2023  Discharge date: 01/15/23  Discharge Physician: Jennye Boroughs   PCP: Tracie Harrier, MD   Recommendations at discharge:   Follow-up with PCP in 1 to 2 weeks Follow-up with the wound care clinic wound care Follow-up with Dr. Delaine Lame, [place] specialist (office will call to schedule appointment)  Discharge Diagnoses: Principal Problem:   Severe sepsis [facility]) Active Problems:   Cancer of right breast ([place])   Wound infection   Chronic diastolic CHF (congestive heart failure) (HCC)   Anemia of chronic disease   Chronic ulcer of great toe of left foot with fat layer exposed ([place])   Benign essential hypertension   HLD (hyperlipidemia)   Acquired hypothyroidism   CKD (chronic kidney disease) stage 2, GFR 60-89 ml/min   Ulcer of both feet, limited to breakdown of skin ([place])   Pressure ulcers of skin of multiple topographic sites   Diabetes mellitus, type 2 (HCC)   PAD (peripheral artery disease) ([place])   Lymphedema   History of mastectomy, right   Malnutrition of moderate degree   Leukocytosis   Decubitus ulcer of trochanter, right, stage III (HCC)   Hypoalbuminemia   Osteomyelitis of right hip (HCC)  Resolved Problems:   * No resolved hospital problems. [facility] Course:   [name] is a 82 year old female with history of chronic wounds, gout, hypertension, hyperlipidemia, iron deficiency anemia, acquired hypothyroid, non-insulin-dependent diabetes mellitus, who presented to the ED on evening of 01/05/23 for evaluation of worsening right hip wound.  Patient's wound nurse reportedly concerned of progressive signs of infection over prior 2-3 days.  Patient reports significant pain related to the hip and sacral wounds.   Admitted to medicine service with general surgery consulted for debridement.    Started on empiric IV Vancomycin and Cefepime for  empiric broad spectrum coverage.   Infectious disease consulted.  Antibiotics were changed to IV Daptomycin and Zosyn.   Palliative Care consulted for goals of care discussions.   Patient is debilitated, malnourished, chronically ill.      Assessment and Plan:   Severe sepsis, osteomyelitis of sacrum and right greater trochanter: S/p debridement of right trochanteric decubitus ulcer on 01/06/2023.  Dr. Delaine Lame, [place] specialist, recommended 4-week course of Augmentin at discharge.  Continue local wound care     Hypertension: BP is better.  Continue atenolol and lisinopril     Chronic left great toe ulcer, bilateral foot wounds: Continue local wound care     Other comorbidities include hypoalbuminemia, hypertension, hyperlipidemia, hypothyroidism, CKD stage II, history of right breast cancer s/p mastectomy, chronic lymphedema of lower extremities, type II DM, PVD     Hypotension: Resolved    She was evaluated by PT and OT who recommended home health therapy.  She feels better and she is deemed stable for discharge to home today.  Discharge plan was discussed with her husband over the phone.        Consultants: ID specialist, general surgeon, palliative care Procedures performed: Excisional debridement of right trochanteric decubitus ulcer Disposition: Home health Diet recommendation:  Discharge Diet Orders (From admission, onward)     Start     Ordered   01/15/23 0000  Diet - low sodium heart healthy        01/15/23 1011   01/11/23 0000  Diet general        01/11/23 1113  Cardiac diet DISCHARGE MEDICATION: Allergies as of 01/15/2023       Reactions   Other Anaphylaxis   Anesthisia has been a health issue for her in the past.    Sulfa Antibiotics Rash        Medication List     STOP taking these medications    fluconazole 100 MG tablet Commonly known as: DIFLUCAN       TAKE these medications    acetaminophen 500 MG tablet Commonly  known as: TYLENOL Take 500-1,000 mg by mouth 4 (four) times daily as needed for mild pain or moderate pain.   allopurinol 300 MG tablet Commonly known as: ZYLOPRIM Take 300 mg by mouth daily.   amoxicillin-clavulanate 875-125 MG tablet Commonly known as: AUGMENTIN Take 1 tablet by mouth every 12 (twelve) hours for 28 days. What changed: when to take this   aspirin EC 81 MG tablet Take 1 tablet (81 mg total) by mouth daily. Swallow whole.   atenolol 50 MG tablet Commonly known as: TENORMIN Take 50 mg by mouth daily.   atorvastatin 40 MG tablet Commonly known as: LIPITOR Take 40 mg by mouth daily.   cyanocobalamin 1000 MCG tablet Commonly known as: VITAMIN B12 Take 1,000 mcg by mouth daily.   diphenoxylate-atropine 2.5-0.025 MG tablet Commonly known as: LOMOTIL Take 1-2 tablets by mouth 3 (three) times daily as needed.   furosemide 40 MG tablet Commonly known as: LASIX Take 1 tablet (40 mg total) by mouth daily as needed for edema or fluid.   gentamicin cream 0.1 % Commonly known as: GARAMYCIN Apply 1 Application topically in the morning and at bedtime.   HYDROcodone-acetaminophen 7.5-325 MG tablet Commonly known as: NORCO Take 1 tablet by mouth 2 (two) times daily as needed for moderate pain or severe pain.   leptospermum manuka honey Pste paste Apply 1 Application topically daily for 14 days.   levothyroxine 88 MCG tablet Commonly known as: SYNTHROID Take 88 mcg by mouth daily.   lisinopril 5 MG tablet Commonly known as: ZESTRIL Take 5 mg by mouth daily.   metFORMIN 1000 MG tablet Commonly known as: GLUCOPHAGE Take 1,000 mg by mouth in the morning and at bedtime.   multivitamin with minerals Tabs tablet Take 1 tablet by mouth daily.   polyethylene glycol 17 g packet Commonly known as: MIRALAX / GLYCOLAX Take 17 g by mouth daily.   sitaGLIPtin 50 MG tablet Commonly known as: JANUVIA Take 50 mg by mouth daily.               Discharge Care  Instructions  (From admission, onward)           Start     Ordered   01/15/23 0000  Discharge wound care:       Comments: Wound care  Daily      Comments: Wound care sacrum: Cleanse with NS, pat dry. Fill defect with NS moistened gauze, top with silicone foam dressings (Mepilex).  Change daily and PRN drainage strike through onto exterior of dressing.    Wound care  Daily      Comments: Cleanse right trocanter wound with saline, pat dry Apply Medihoney to wound bed, 1/4 thick layer, top with saline moist (not soaked) gauze filling.  Top with ABD pad, secure with tape.  Change daily .   01/15/23 1011   01/11/23 0000  Discharge wound care:       Comments: Wound care to sacral Stage 3 and right trochanter Unstageable pressure injuries: Cleanse  with NS, pat dry. Fill defect with NS moistened gauze, top with silicone foam dressings (Mepilex). Change daily and PRN drainage strike through onto exterior of dressing.   01/11/23 1113            Discharge Exam: Filed Weights   01/05/23 1511  Weight: 52.6 kg   GEN: NAD SKIN: Warm and dry.  Stage III sacral and right greater trochanter decubitus ulcers.  Bilateral heel wounds EYES: Anicteric ENT: MMM CV: RRR PULM: CTA B ABD: soft, ND, NT, +BS CNS: AAO x 3, non focal EXT: No edema or tenderness   Condition at discharge: stable  The results of significant diagnostics from this hospitalization (including imaging, microbiology, ancillary and laboratory) are listed below for reference.   Imaging Studies: MR PELVIS W WO CONTRAST  Result Date: 01/05/2023 CLINICAL DATA:  Evaluate for sacral osteomyelitis.  Decubitus ulcer. EXAM: MRI PELVIS WITHOUT AND WITH CONTRAST TECHNIQUE: Multiplanar multisequence MR imaging of the pelvis was performed both before and after administration of intravenous contrast. CONTRAST:  40m GADAVIST GADOBUTROL 1 MMOL/ML IV SOLN COMPARISON:  CT abdomen pelvis 12/19/2022 FINDINGS: The technologist reports the  patient was contracted left decubitus position. There is also moderate to high-grade patient motion artifact. The following findings are made within this limitation. Urinary Tract: There is a Foley catheter terminating within urinary bladder. There is likely associated nondependent air seen within the right aspect of the urinary bladder (with the patient in left side down decubitus positioning). Bowel:  Unremarkable visualized pelvic bowel loops. Vascular/Lymphatic: No gross abnormality. Reproductive:  The uterus is surgically absent. Other: There is mild subcutaneous fat edema and swelling within the anterior left thigh. Musculoskeletal: FCM prior CT, there is a left paramidline sacral decubitus ulcer measuring up to approximately 2.5 cm in AP depth at the superior aspect of the gluteal crease (axial image 31, coronal image 6, sagittal image 36). This contacts the posterior left cigarette coccygeal junction. There is moderate to high-grade subcutaneous fat edema extending superiorly and inferiorly from this ulcer. there is mild adjacent left sacral marrow edema and enhancement (coronal series 6 images 9 through 11) suspicious for active osteomyelitis. There is also a soft tissue defect suspicious for ulceration lateral to the greater trochanter with fluid seen superficial to the anterior right gluteus medius muscle, superior to the right greater trochanter in a region measuring up to approximately 1.9 x 5.6 x 7.1 cm (transverse by AP by craniocaudal). a there is focal superficial increased T2 signal and enhancement within the right greater trochanter suspicious for early osteomyelitis given the adjacent appearance soft tissue wound (axial series 5 images 36 through 41, coronal series 6 images 32 through 36). IMPRESSION: 1. Left paramidline sacral decubitus ulcer measuring up to approximately 2.5 cm in AP depth at the superior aspect of the gluteal crease. There is mild adjacent left sacral marrow edema and  enhancement suspicious for active osteomyelitis. 2. There is also a soft tissue ulceration lateral to the right greater trochanter with fluid seen superficial to the anterior right gluteus medius muscle in a region measuring up to approximately 1.9 x 5.6 x 7.1 cm. Focal right greater trochanter superficial marrow edema and enhancement suspicious for early osteomyelitis given the adjacent soft tissue wound. Electronically Signed   By: RYvonne KendallM.D.   On: 01/05/2023 21:05   MR Lumbar Spine W Wo Contrast  Result Date: 01/05/2023 CLINICAL DATA:  Lower back pain.  Infection suspected. EXAM: MRI LUMBAR SPINE WITHOUT AND WITH CONTRAST TECHNIQUE: Multiplanar and  multiecho pulse sequences of the lumbar spine were obtained without and with intravenous contrast. CONTRAST:  60m GADAVIST GADOBUTROL 1 MMOL/ML IV SOLN COMPARISON:  CT abdomen and pelvis 12/19/2022; MRI lumbar spine 08/24/2022 FINDINGS: Despite efforts by the technologist and patient, moderate motion artifact is present on today's exam and could not be eliminated. This reduces exam sensitivity and specificity. Segmentation:  Standard. Alignment: Mild retrolisthesis of L1 on L2 and L2 on L3, unchanged. Trace anterolisthesis of L4 on L5, unchanged. Vertebrae: Vertebral body heights are maintained. Severe anterior T12-L1, severe right-greater-than-left L1-2, severe left L2-3, mild left L3-4, and mild left L4-5 disc space narrowing, similar to prior. Trace fluid within the mid to right aspect of the L1-2 disc is very similar to 08/24/2022 and favored to be degenerative. No endplate erosions or edema to suggest discitis/osteomyelitis. Conus medullaris and cauda equina: The conus terminates at the approximate L1-2 level. Conus and cauda equina appear grossly normal within the confines of motion artifact. Paraspinal and other soft tissues: No gross abnormality is seen within the retroperitoneal soft tissues. No suspicious fluid collection or enhancement. Disc  levels: T12-L1: Mild broad-based posterior disc osteophyte complex. No central canal or neuroforaminal stenosis. No significant change. L1-2: Moderate right-greater-than-left posterior disc osteophyte complex with minimal, migration on the right, similar to prior. Within the limitations of motion artifact, no definite change from prior. Mild right lateral recess narrowing without central canal stenosis. No definite neuroforaminal stenosis. L2-3: Moderate to severe bilateral facet joint hypertrophy. Mild retrolisthesis and moderate broad-based posterior disc osteophyte complex. Severe central canal stenosis appears unchanged. Severe left and mild right neuroforaminal stenosis is grossly unchanged. L3-4: Moderate bilateral facet joint hypertrophy. Mild-to-moderate broad-based posterior disc bulge. Mild central canal stenosis appears unchanged. No definitive neuroforaminal stenosis. L4-5: Severe bilateral facet joint hypertrophy. Trace anterolisthesis, unchanged. Moderate ligamentum flavum hypertrophy. Mild broad-based posterior disc bulge. Moderate to severe central canal stenosis, unchanged. Mild left-greater-than-right neuroforaminal stenosis, grossly unchanged. L5-S1: Severe right-greater-than-left facet joint hypertrophy. Mild posterior disc bulge. No central canal or neuroforaminal stenosis. No significant change. IMPRESSION: Compared to 08/24/2022: 1. Moderate motion artifact technically limits this examination. 2. Multilevel degenerative disc and joint changes as above, including severe L2-3 and moderate to severe L4-5 central canal stenosis, unchanged from prior. 3. Multilevel neuroforaminal stenosis including severe left L2-3 neuroforaminal stenosis, unchanged. 4. Mild fluid within the L1-2 disc space is similar to prior and again favored to be degenerative. No definite evidence of discitis/osteomyelitis. Electronically Signed   By: RYvonne KendallM.D.   On: 01/05/2023 20:51   ECHOCARDIOGRAM  LIMITED  Result Date: 12/20/2022    ECHOCARDIOGRAM LIMITED REPORT   Patient Name:   [facility]am: 12/20/2022 Medical Rec #:  [id number]         Height:       59.0 in Accession #:    [id number]        Weight:       109.6 lb Date of Birth:  302/02/1941         BSA:          1.428 m Patient Age:    89years           BP:           119/73 mmHg Patient Gender: F                  HR:           88 bpm. Exam Location:  [facility] Procedure: Limited Echo Indications:     NSTEMI  History:         Patient has prior history of Echocardiogram examinations, most                  recent 10/31/2022. CHF, Acute MI, PAD,                  Signs/Symptoms:Bacteremia; Risk Factors:Hypertension, Diabetes                  and Dyslipidemia. Thrombus, Breast CA.  Sonographer:     Wenda Low Referring Phys:  [id number] Hebert Soho Diagnosing Phys: Ida Rogue MD  Sonographer Comments: Image acquisition challenging due to respiratory motion. IMPRESSIONS  1. Left ventricular ejection fraction, by estimation, is 40 to 45%. The left ventricle has mildly decreased function. The left ventricle demonstrates regional wall motion abnormalities (hypokinesis of the anterior and anteroseptal wall). Left ventricular diastolic parameters are indeterminate. Wall motion abnormality appears new compared to prior study dated 12/23.  2. Right ventricular systolic function is low normal. The right ventricular size is normal. Tricuspid regurgitation signal is inadequate for assessing PA pressure.  3. The mitral valve is normal in structure. No evidence of mitral valve regurgitation. No evidence of mitral stenosis. Moderate mitral annular calcification.  4. The aortic valve is normal in structure. Aortic valve regurgitation is not visualized. Aortic valve sclerosis is present, with no evidence of aortic valve stenosis.  5. The inferior vena cava is normal in size with greater than 50% respiratory variability, suggesting right atrial  pressure of 3 mmHg. FINDINGS  Left Ventricle: Left ventricular ejection fraction, by estimation, is 40 to 45%. The left ventricle has mildly decreased function. The left ventricle demonstrates regional wall motion abnormalities. The left ventricular internal cavity size was normal in size. There is no left ventricular hypertrophy. Left ventricular diastolic parameters are indeterminate. Right Ventricle: The right ventricular size is normal. No increase in right ventricular wall thickness. Right ventricular systolic function is low normal. Tricuspid regurgitation signal is inadequate for assessing PA pressure. Left Atrium: Left atrial size was normal in size. Right Atrium: Right atrial size was normal in size. Pericardium: There is no evidence of pericardial effusion. Mitral Valve: The mitral valve is normal in structure. There is mild calcification of the mitral valve leaflet(s). Moderate mitral annular calcification. No evidence of mitral valve stenosis. Tricuspid Valve: The tricuspid valve is normal in structure. Tricuspid valve regurgitation is not demonstrated. No evidence of tricuspid stenosis. Aortic Valve: The aortic valve is normal in structure. Aortic valve regurgitation is not visualized. Aortic valve sclerosis is present, with no evidence of aortic valve stenosis. Pulmonic Valve: The pulmonic valve was normal in structure. Pulmonic valve regurgitation is not visualized. No evidence of pulmonic stenosis. Aorta: The aortic root is normal in size and structure. Venous: The inferior vena cava is normal in size with greater than 50% respiratory variability, suggesting right atrial pressure of 3 mmHg. IAS/Shunts: No atrial level shunt detected by color flow Doppler. LEFT VENTRICLE PLAX 2D LVIDd:         4.10 cm LVIDs:         2.40 cm LV PW:         1.30 cm LV IVS:        1.00 cm LVOT diam:     1.90 cm LVOT Area:     2.84 cm  LEFT ATRIUM         Index LA diam:  3.80 cm 2.66 cm/m   AORTA Ao Root diam: 2.70 cm   SHUNTS Systemic Diam: 1.90 cm Ida Rogue MD Electronically signed by Ida Rogue MD Signature Date/Time: 12/20/2022/1:13:18 PM    Final    CT Head Wo Contrast  Result Date: 12/19/2022 CLINICAL DATA:  82 year old female with altered mental status. Fever. EXAM: CT HEAD WITHOUT CONTRAST TECHNIQUE: Contiguous axial images were obtained from the base of the skull through the vertex without intravenous contrast. RADIATION DOSE REDUCTION: This exam was performed according to the departmental dose-optimization program which includes automated exposure control, adjustment of the mA and/or kV according to patient size and/or use of iterative reconstruction technique. COMPARISON:  None Available. FINDINGS: Brain: No midline shift, ventriculomegaly, mass effect, evidence of mass lesion, intracranial hemorrhage or evidence of cortically based acute infarction. No cortical encephalomalacia identified. Mild to moderate for age scattered bilateral cerebral white matter hypodensity with some deep white matter capsule involvement. Mild heterogeneity in the left basal ganglia. Vascular: Calcified atherosclerosis at the skull base. No suspicious intracranial vascular hyperdensity. Skull: Intact, negative. Sinuses/Orbits: Visualized paranasal sinuses and mastoids are clear. Other: No acute orbit or scalp soft tissue finding. IMPRESSION: 1. No acute intracranial abnormality. 2. Mild to moderate heterogeneity in the cerebral white matter and left basal for age ganglia compatible with small vessel disease. Electronically Signed   By: Genevie Ann M.D.   On: 12/19/2022 06:32   CT Abdomen Pelvis W Contrast  Result Date: 12/19/2022 CLINICAL DATA:  82 year old female with history of acute onset of nonlocalized abdominal pain. EXAM: CT ABDOMEN AND PELVIS WITH CONTRAST TECHNIQUE: Multidetector CT imaging of the abdomen and pelvis was performed using the standard protocol following bolus administration of intravenous contrast. RADIATION  DOSE REDUCTION: This exam was performed according to the departmental dose-optimization program which includes automated exposure control, adjustment of the mA and/or kV according to patient size and/or use of iterative reconstruction technique. CONTRAST:  54m OMNIPAQUE IOHEXOL 300 MG/ML  SOLN COMPARISON:  CT of the abdomen and pelvis 11/01/2022. FINDINGS: Lower chest: Trace bilateral pleural effusions lying dependently. Atherosclerotic calcifications are noted in the descending thoracic aorta. Calcifications of the mitral annulus. Hepatobiliary: No definite suspicious cystic or solid hepatic lesions are noted. No intra or extrahepatic biliary ductal dilatation. Status post cholecystectomy. Pancreas: No pancreatic mass. No pancreatic ductal dilatation. No pancreatic or peripancreatic fluid collections or inflammatory changes. Spleen: Unremarkable. Adrenals/Urinary Tract: Extensive cortical thinning noted in the anterior aspect of the left kidney, presumably scarring from prior infection or infarction. Right kidney and bilateral adrenal glands are otherwise normal in appearance. No hydroureteronephrosis. Urinary bladder is moderately distended, and base of the urinary bladder extends well below the level of the pubococcygeal line at rest, indicative of a cystocele from pelvic floor laxity. Stomach/Bowel: The appearance of the stomach is normal. No pathologic dilatation of small bowel or colon. Numerous colonic diverticuli are noted, without definite focal surrounding inflammatory changes to indicate an acute diverticulitis at this time. Normal appendix. Vascular/Lymphatic: Aortic atherosclerosis, without evidence of aneurysm or dissection in the abdominal or pelvic vasculature. IVC filter noted, with tip terminating shortly below the level of the renal veins. No lymphadenopathy noted in the abdomen or pelvis. Reproductive: Status post hysterectomy. Ovaries are not confidently identified may be surgically absent or  atrophic. Other: No significant volume of ascites.  No pneumoperitoneum. Musculoskeletal: There are no aggressive appearing lytic or blastic lesions noted in the visualized portions of the skeleton. IMPRESSION: 1. No definite acute findings noted in the  abdomen or pelvis to account for the patient's symptoms. 2. Trace bilateral pleural effusions lying dependently. 3. Colonic diverticulosis without evidence of acute diverticulitis at this time. 4. Aortic atherosclerosis. 5. Pelvic floor laxity with cystocele. 6. Additional incidental findings, as above. Electronically Signed   By: Vinnie Langton M.D.   On: 12/19/2022 06:26   DG Foot Complete Right  Result Date: 12/19/2022 CLINICAL DATA:  82 year old female with fever, altered mental status. Foot wound. Query osteomyelitis. EXAM: RIGHT FOOT COMPLETE - 3+ VIEW COMPARISON:  Calcaneus series 11/02/2022. FINDINGS: Bulky chronic degenerative and/or post arthrodesis changes at the right ankle joint. Chronic sclerosis and cannulated screw fragment there appear grossly stable since December. Bulky chronic dorsal calcaneus degenerative spurring with overlying soft tissue deficiency. But no osteolysis to indicate osteomyelitis. No soft tissue gas. No superimposed acute fracture or dislocation identified in the right foot. Hallux valgus and metatarsus primus varus. IMPRESSION: 1. Soft tissue deficiency overlying the dorsal calcaneus but no radiographic evidence of osteomyelitis. 2. Bulky chronic degenerative and/or post arthrodesis changes at the right ankle, with retained screw fragment. Electronically Signed   By: Genevie Ann M.D.   On: 12/19/2022 04:21   DG Foot Complete Left  Result Date: 12/19/2022 CLINICAL DATA:  82 year old female with fever, altered mental status. Foot wound. Query osteomyelitis. EXAM: LEFT FOOT - COMPLETE 3+ VIEW COMPARISON:  None Available. FINDINGS: Dorsal soft tissue wound overlying the calcaneus. Achilles insertion degenerative spurring at the  calcaneus there, mildly discontinuous but no cortical osteolysis to suggest osteomyelitis. No tracking soft tissue gas. Hallux valgus and metatarsus primus varus. No acute fracture or dislocation identified. Some calcified peripheral vascular disease. IMPRESSION: 1. Dorsal soft tissue wound overlying the calcaneus with no radiographic evidence of Osteomyelitis. 2.  No acute osseous abnormality identified. Electronically Signed   By: Genevie Ann M.D.   On: 12/19/2022 04:18   DG Chest Port 1 View  Result Date: 12/19/2022 CLINICAL DATA:  82 year old female with fever, altered mental status. Confusion. EXAM: PORTABLE CHEST 1 VIEW COMPARISON:  Portable chest 11/01/2022 and earlier. FINDINGS: Portable AP semi upright view at 0401 hours. Mildly rotated to the right. Improved lung volumes compared to prior portable exams. Mediastinal contours are within normal limits. Allowing for portable technique the lungs are clear. No pneumothorax or pleural effusion identified. Chronic degenerative and postoperative changes at the shoulders. No acute osseous abnormality identified. Paucity of bowel gas in the visible abdomen. IMPRESSION: No acute cardiopulmonary abnormality. Electronically Signed   By: Genevie Ann M.D.   On: 12/19/2022 04:16    Microbiology: Results for orders placed or performed during the hospital encounter of 01/05/23  Blood culture (routine x 2)     Status: None   Collection Time: 01/05/23  3:29 PM   Specimen: BLOOD  Result Value Ref Range Status   Specimen Description BLOOD RIGHT ANTECUBITAL  Final   Special Requests   Final    BOTTLES DRAWN AEROBIC AND ANAEROBIC Blood Culture adequate volume   Culture   Final    NO GROWTH 5 DAYS Performed at [facility], [address] [place], [place] [address]    Report Status 01/10/2023 FINAL  Final  Blood culture (routine x 2)     Status: None   Collection Time: 01/05/23  4:47 PM   Specimen: BLOOD  Result Value Ref Range Status   Specimen  Description BLOOD BLOOD RIGHT HAND  Final   Special Requests   Final    BOTTLES DRAWN AEROBIC AND ANAEROBIC Blood  Culture adequate volume   Culture   Final    NO GROWTH 5 DAYS Performed at [facility], [address] [place], [place] [address]    Report Status 01/10/2023 FINAL  Final  MRSA Next Gen by PCR, Nasal     Status: None   Collection Time: 01/06/23 11:31 AM   Specimen: Nasal Mucosa; Nasal Swab  Result Value Ref Range Status   MRSA by PCR Next Gen NOT DETECTED NOT DETECTED Final    Comment: (NOTE) The GeneXpert MRSA Assay (FDA approved for NASAL specimens only), is one component of a comprehensive MRSA colonization surveillance program. It is not intended to diagnose MRSA infection nor to guide or monitor treatment for MRSA infections. Test performance is not FDA approved in patients less than 51 years old. Performed at [facility], [place], [place] [address]   Aerobic Culture w Gram Stain (superficial specimen)     Status: None   Collection Time: 01/10/23  6:12 PM   Specimen: Joint, Hip; Wound  Result Value Ref Range Status   Specimen Description   Final    HIP RIGHT Performed at [facility], [address], [place], [place] [address]    Special Requests   Final    NONE Performed at [facility], [address] [place], [place] [address]    Gram Stain   Final    FEW WBC PRESENT,BOTH PMN AND MONONUCLEAR NO ORGANISMS SEEN    Culture   Final    NO GROWTH Performed at [facility], [place] [address], [place],  [address]    Report Status 01/13/2023 FINAL  Final    Labs: CBC: Recent Labs  Lab 01/09/23 0302  WBC 8.4  HGB 8.8*  HCT 29.2*  MCV 81.6  PLT [id number]   Basic Metabolic Panel: Recent Labs  Lab 01/09/23 0302 01/15/23 0501  NA 138  --   K 3.5  --   CL 108  --   CO2 21*  --   GLUCOSE 68*  --   BUN 19  --   CREATININE 0.80 0.69  CALCIUM 8.1*  --    Liver Function Tests: No  results for input(s): "AST", "ALT", "ALKPHOS", "BILITOT", "PROT", "ALBUMIN" in the last 168 hours. CBG: Recent Labs  Lab 01/09/23 1142 01/09/23 1659 01/09/23 2129 01/10/23 0751 01/10/23 1105  GLUCAP 106* 84 90 117* 127*    Discharge time spent: greater than 30 minutes.  Signed: Jennye Boroughs, MD [facility] 01/15/2023

## 2023-01-16 ENCOUNTER — Ambulatory Visit: Payer: PPO | Admitting: Physician Assistant

## 2023-01-19 DIAGNOSIS — E7800 Pure hypercholesterolemia, unspecified: Secondary | ICD-10-CM | POA: Diagnosis not present

## 2023-01-19 DIAGNOSIS — N1330 Unspecified hydronephrosis: Secondary | ICD-10-CM | POA: Diagnosis not present

## 2023-01-19 DIAGNOSIS — L89613 Pressure ulcer of right heel, stage 3: Secondary | ICD-10-CM | POA: Diagnosis not present

## 2023-01-19 DIAGNOSIS — I5032 Chronic diastolic (congestive) heart failure: Secondary | ICD-10-CM | POA: Diagnosis not present

## 2023-01-19 DIAGNOSIS — I82412 Acute embolism and thrombosis of left femoral vein: Secondary | ICD-10-CM | POA: Diagnosis not present

## 2023-01-19 DIAGNOSIS — L89892 Pressure ulcer of other site, stage 2: Secondary | ICD-10-CM | POA: Diagnosis not present

## 2023-01-19 DIAGNOSIS — M1712 Unilateral primary osteoarthritis, left knee: Secondary | ICD-10-CM | POA: Diagnosis not present

## 2023-01-19 DIAGNOSIS — L89213 Pressure ulcer of right hip, stage 3: Secondary | ICD-10-CM | POA: Diagnosis not present

## 2023-01-19 DIAGNOSIS — G9341 Metabolic encephalopathy: Secondary | ICD-10-CM | POA: Diagnosis not present

## 2023-01-19 DIAGNOSIS — M8580 Other specified disorders of bone density and structure, unspecified site: Secondary | ICD-10-CM | POA: Diagnosis not present

## 2023-01-19 DIAGNOSIS — M47812 Spondylosis without myelopathy or radiculopathy, cervical region: Secondary | ICD-10-CM | POA: Diagnosis not present

## 2023-01-19 DIAGNOSIS — M792 Neuralgia and neuritis, unspecified: Secondary | ICD-10-CM | POA: Diagnosis not present

## 2023-01-19 DIAGNOSIS — I890 Lymphedema, not elsewhere classified: Secondary | ICD-10-CM | POA: Diagnosis not present

## 2023-01-19 DIAGNOSIS — G8929 Other chronic pain: Secondary | ICD-10-CM | POA: Diagnosis not present

## 2023-01-19 DIAGNOSIS — I110 Hypertensive heart disease with heart failure: Secondary | ICD-10-CM | POA: Diagnosis not present

## 2023-01-19 DIAGNOSIS — M1A9XX0 Chronic gout, unspecified, without tophus (tophi): Secondary | ICD-10-CM | POA: Diagnosis not present

## 2023-01-19 DIAGNOSIS — M5410 Radiculopathy, site unspecified: Secondary | ICD-10-CM | POA: Diagnosis not present

## 2023-01-19 DIAGNOSIS — L89154 Pressure ulcer of sacral region, stage 4: Secondary | ICD-10-CM | POA: Diagnosis not present

## 2023-01-19 DIAGNOSIS — I70201 Unspecified atherosclerosis of native arteries of extremities, right leg: Secondary | ICD-10-CM | POA: Diagnosis not present

## 2023-01-19 DIAGNOSIS — E1140 Type 2 diabetes mellitus with diabetic neuropathy, unspecified: Secondary | ICD-10-CM | POA: Diagnosis not present

## 2023-01-19 DIAGNOSIS — E039 Hypothyroidism, unspecified: Secondary | ICD-10-CM | POA: Diagnosis not present

## 2023-01-19 DIAGNOSIS — E1151 Type 2 diabetes mellitus with diabetic peripheral angiopathy without gangrene: Secondary | ICD-10-CM | POA: Diagnosis not present

## 2023-01-19 DIAGNOSIS — I700 Atherosclerosis of aorta: Secondary | ICD-10-CM | POA: Diagnosis not present

## 2023-01-19 DIAGNOSIS — I82422 Acute embolism and thrombosis of left iliac vein: Secondary | ICD-10-CM | POA: Diagnosis not present

## 2023-01-22 ENCOUNTER — Telehealth: Payer: Self-pay | Admitting: Family Medicine

## 2023-01-22 NOTE — Telephone Encounter (Signed)
Patient's husband called stating there is no urine in the catheter bag. She did not show up for a voiding trial in office last week. He was wanting to come in today. I informed him there is no appointments available in office and to give her fluid and to adjust the catheter up into the bladder further and see if urine drains. I scheduled her for a voiding trial on Thursday. He states she has no complaints of abdominal discomfort.He is to call our office back this afternoon if nothing begins to drain or if she starts complaining of discomfort and her belly gets tight or distended. He voiced understanding.

## 2023-01-23 ENCOUNTER — Telehealth: Payer: Self-pay

## 2023-01-23 DIAGNOSIS — G8929 Other chronic pain: Secondary | ICD-10-CM | POA: Diagnosis not present

## 2023-01-23 DIAGNOSIS — N1330 Unspecified hydronephrosis: Secondary | ICD-10-CM | POA: Diagnosis not present

## 2023-01-23 DIAGNOSIS — I82422 Acute embolism and thrombosis of left iliac vein: Secondary | ICD-10-CM | POA: Diagnosis not present

## 2023-01-23 DIAGNOSIS — E1151 Type 2 diabetes mellitus with diabetic peripheral angiopathy without gangrene: Secondary | ICD-10-CM | POA: Diagnosis not present

## 2023-01-23 DIAGNOSIS — M1712 Unilateral primary osteoarthritis, left knee: Secondary | ICD-10-CM | POA: Diagnosis not present

## 2023-01-23 DIAGNOSIS — M792 Neuralgia and neuritis, unspecified: Secondary | ICD-10-CM | POA: Diagnosis not present

## 2023-01-23 DIAGNOSIS — I5032 Chronic diastolic (congestive) heart failure: Secondary | ICD-10-CM | POA: Diagnosis not present

## 2023-01-23 DIAGNOSIS — L89613 Pressure ulcer of right heel, stage 3: Secondary | ICD-10-CM | POA: Diagnosis not present

## 2023-01-23 DIAGNOSIS — I70201 Unspecified atherosclerosis of native arteries of extremities, right leg: Secondary | ICD-10-CM | POA: Diagnosis not present

## 2023-01-23 DIAGNOSIS — M5410 Radiculopathy, site unspecified: Secondary | ICD-10-CM | POA: Diagnosis not present

## 2023-01-23 DIAGNOSIS — E039 Hypothyroidism, unspecified: Secondary | ICD-10-CM | POA: Diagnosis not present

## 2023-01-23 DIAGNOSIS — I890 Lymphedema, not elsewhere classified: Secondary | ICD-10-CM | POA: Diagnosis not present

## 2023-01-23 DIAGNOSIS — G9341 Metabolic encephalopathy: Secondary | ICD-10-CM | POA: Diagnosis not present

## 2023-01-23 DIAGNOSIS — M8580 Other specified disorders of bone density and structure, unspecified site: Secondary | ICD-10-CM | POA: Diagnosis not present

## 2023-01-23 DIAGNOSIS — I110 Hypertensive heart disease with heart failure: Secondary | ICD-10-CM | POA: Diagnosis not present

## 2023-01-23 DIAGNOSIS — L89154 Pressure ulcer of sacral region, stage 4: Secondary | ICD-10-CM | POA: Diagnosis not present

## 2023-01-23 DIAGNOSIS — E1140 Type 2 diabetes mellitus with diabetic neuropathy, unspecified: Secondary | ICD-10-CM | POA: Diagnosis not present

## 2023-01-23 DIAGNOSIS — I82412 Acute embolism and thrombosis of left femoral vein: Secondary | ICD-10-CM | POA: Diagnosis not present

## 2023-01-23 DIAGNOSIS — L89892 Pressure ulcer of other site, stage 2: Secondary | ICD-10-CM | POA: Diagnosis not present

## 2023-01-23 DIAGNOSIS — E7800 Pure hypercholesterolemia, unspecified: Secondary | ICD-10-CM | POA: Diagnosis not present

## 2023-01-23 DIAGNOSIS — M1A9XX0 Chronic gout, unspecified, without tophus (tophi): Secondary | ICD-10-CM | POA: Diagnosis not present

## 2023-01-23 DIAGNOSIS — M47812 Spondylosis without myelopathy or radiculopathy, cervical region: Secondary | ICD-10-CM | POA: Diagnosis not present

## 2023-01-23 DIAGNOSIS — L89213 Pressure ulcer of right hip, stage 3: Secondary | ICD-10-CM | POA: Diagnosis not present

## 2023-01-23 DIAGNOSIS — I700 Atherosclerosis of aorta: Secondary | ICD-10-CM | POA: Diagnosis not present

## 2023-01-23 NOTE — Progress Notes (Unsigned)
01/24/2023 9:07 AM   [name] [birth year] [id number]  Referring provider: Tracie Harrier, MD [address] [facility] [place],  [place] [address]  Urological history: 1.  High risk hematuria -Non-smoker -History of microscopic hematuria with 4-10 RBC's -non-contrast CT (2019) -no malignancies -cysto (2019) -no malignancies -No complaints of gross heme  2. Cystocele -seen on numerous CT's -appreciated on exam   Chief Complaint  Patient presents with   cath removal    HPI: [name] is a 82 y.o. female who presents today for trial of void with her husband, Herbie [place].    She stated she did not want to go forward with the voiding trial today, but would rather just have the catheter remained indwelling.  She is overwhelmed with doctor visits and does not does not want to deal with a voiding trial at this time.  The catheter has had some issues with drainage, but it may be because she has a silicone catheter in place.  Patient denies any modifying or aggravating factors.  Patient denies any gross hematuria or suprapubic/flank pain.  Patient denies any fevers, chills, nausea or vomiting.    PMH: Past Medical History:  Diagnosis Date   Arthritis    Arthritis    Breast cancer ([place]) 1992   right breast with lumpectomy and rad tx   Cancer of right breast ([place]) 04/16/2013   right breast with mastectomy   Diabetes mellitus without complication ([place])    Gout    High cholesterol    Hyperlipidemia    Hypertension    Personal history of radiation therapy     Surgical History: Past Surgical History:  Procedure Laterality Date   ABDOMINAL HYSTERECTOMY     ANKLE FRACTURE SURGERY Right 2004   BREAST LUMPECTOMY Right 1992   positive   IRRIGATION AND DEBRIDEMENT FOOT Right 11/02/2022   Procedure: IRRIGATION AND DEBRIDEMENT RIGHT FOOT AND BONE BIOPSY RIGHT FOOT;  Surgeon: Samara Deist, DPM;  Location: [facility];  Service: Podiatry;  Laterality:  Right;   IVC FILTER INSERTION N/A 11/06/2022   Procedure: IVC FILTER INSERTION;  Surgeon: Algernon Huxley, MD;  Location: [facility];  Service: Cardiovascular;  Laterality: N/A;   MASTECTOMY Right 2014   PERIPHERAL VASCULAR THROMBECTOMY Left 11/06/2022   Procedure: PERIPHERAL VASCULAR THROMBECTOMY- EXTERNAL Iliac Vein/CFV;  Surgeon: Algernon Huxley, MD;  Location: [facility];  Service: Cardiovascular;  Laterality: Left;   SHOULDER SURGERY Right 2010    Home Medications:  Allergies as of 01/24/2023       Reactions   Other Anaphylaxis   Anesthisia has been a health issue for her in the past.    Sulfa Antibiotics Rash        Medication List        Accurate as of January 24, 2023  9:07 AM. If you have any questions, ask your nurse or doctor.          acetaminophen 500 MG tablet Commonly known as: TYLENOL Take 500-1,000 mg by mouth 4 (four) times daily as needed for mild pain or moderate pain.   allopurinol 300 MG tablet Commonly known as: ZYLOPRIM Take 300 mg by mouth daily.   amoxicillin-clavulanate 875-125 MG tablet Commonly known as: AUGMENTIN Take 1 tablet by mouth every 12 (twelve) hours for 28 days.   aspirin EC 81 MG tablet Take 1 tablet (81 mg total) by mouth daily. Swallow whole.   atenolol 50 MG tablet Commonly known as:  TENORMIN Take 50 mg by mouth daily.   atorvastatin 40 MG tablet Commonly known as: LIPITOR Take 40 mg by mouth daily.   cyanocobalamin 1000 MCG tablet Commonly known as: VITAMIN B12 Take 1,000 mcg by mouth daily.   diphenoxylate-atropine 2.5-0.025 MG tablet Commonly known as: LOMOTIL Take 1-2 tablets by mouth 3 (three) times daily as needed.   furosemide 40 MG tablet Commonly known as: LASIX Take 1 tablet (40 mg total) by mouth daily as needed for edema or fluid.   gentamicin cream 0.1 % Commonly known as: GARAMYCIN Apply 1 Application topically in the morning and at bedtime.   HYDROcodone-acetaminophen 7.5-325 MG  tablet Commonly known as: NORCO Take 1 tablet by mouth 2 (two) times daily as needed for moderate pain or severe pain.   leptospermum manuka honey Pste paste Apply 1 Application topically daily for 14 days.   levothyroxine 88 MCG tablet Commonly known as: SYNTHROID Take 88 mcg by mouth daily.   lisinopril 5 MG tablet Commonly known as: ZESTRIL Take 5 mg by mouth daily.   metFORMIN 1000 MG tablet Commonly known as: GLUCOPHAGE Take 1,000 mg by mouth in the morning and at bedtime.   multivitamin with minerals Tabs tablet Take 1 tablet by mouth daily.   polyethylene glycol 17 g packet Commonly known as: MIRALAX / GLYCOLAX Take 17 g by mouth daily.   sitaGLIPtin 50 MG tablet Commonly known as: JANUVIA Take 50 mg by mouth daily.        Allergies:  Allergies  Allergen Reactions   Other Anaphylaxis    Anesthisia has been a health issue for her in the past.    Sulfa Antibiotics Rash    Family History: Family History  Problem Relation Age of Onset   Diabetes Father    Breast cancer Neg Hx     Social History:  reports that she has never smoked. She has never been exposed to tobacco smoke. She has never used smokeless tobacco. She reports that she does not drink alcohol and does not use drugs.  ROS: Pertinent ROS in HPI  Physical Exam: Blood pressure 112/74, pulse (!) 112, height '4\' 11"'$  (1.499 m), weight 116 lb (52.6 kg).  Constitutional: Elderly and frail-appearing. HEENT: [place] AT, moist mucus membranes.  Trachea midline Cardiovascular: No clubbing, cyanosis, or edema. Respiratory: Normal respiratory effort, no increased work of breathing. Neurologic: Grossly intact, no focal deficits, moving all 4 extremities. Psychiatric: Normal mood and affect.     Laboratory Data:    Latest Ref Rng & Units 01/09/2023    3:02 AM 01/08/2023    6:28 AM 01/07/2023    6:21 AM  CBC  WBC 4.0 - 10.5 K/uL 8.4  11.0  8.7   Hemoglobin 12.0 - 15.0 g/dL 8.8  9.5  7.9   Hematocrit 36.0 -  46.0 % 29.2  32.2  26.6   Platelets 150 - 400 K/uL 290  333  279        Latest Ref Rng & Units 01/15/2023    5:01 AM 01/09/2023    3:02 AM 01/07/2023    6:21 AM  BMP  Glucose 70 - 99 mg/dL  68  104   BUN 8 - 23 mg/dL  19  22   Creatinine 0.44 - 1.00 mg/dL 0.69  0.80  0.89   Sodium 135 - 145 mmol/L  138  136   Potassium 3.5 - 5.1 mmol/L  3.5  3.9   Chloride 98 - 111 mmol/L  108  107  CO2 22 - 32 mmol/L  21  24   Calcium 8.9 - 10.3 mg/dL  8.1  7.8    I have reviewed the labs.    Pertinent Imaging: N/A  Cath Change/ Replacement  Patient is present today for a catheter change due to urinary retention.  8 ml of water was removed from the balloon, a 16 FR Silicone foley cath was removed without difficulty.  Patient was cleaned and prepped in a sterile fashion with betadine and 2% lidocaine jelly was instilled into the urethra. A 16 FR foley cath was replaced into the bladder, no complications were noted. Urine return was noted 10 ml and urine was yellow in color. The balloon was filled with 20m of sterile water. A leg bag was attached for drainage.  A night bag was also given to the patient and patient was given instruction on how to change from one bag to another. Patient was given proper instruction on catheter care.    Performed by: CKyra Manges CMA   Assessment & Plan:    1. Urinary retention -She desires to keep Foley catheter indwelling at this time -We went ahead and exchanged it and scheduled her for follow-up in 1 month for her next exchange and we will discuss a repeat voiding trial at that time  2. Microscopic hematuria -no reports of gross heme  Return in about 1 month (around 02/24/2023) for Foley catheter exchange .  These notes generated with voice recognition software. I apologize for typographical errors.  [place] [address] [place] [phone]

## 2023-01-23 NOTE — Telephone Encounter (Signed)
Telephone call to patient/spouse to discuss PC referral and schedule initial visit. Spouse stated patient is doing pretty good at this time and would like to speak with her first about the referral before scheduling a visit.  Spouse has SW contact information to outreach when ready.

## 2023-01-24 ENCOUNTER — Encounter: Payer: Self-pay | Admitting: Urology

## 2023-01-24 ENCOUNTER — Ambulatory Visit: Payer: PPO | Admitting: Urology

## 2023-01-24 ENCOUNTER — Ambulatory Visit (INDEPENDENT_AMBULATORY_CARE_PROVIDER_SITE_OTHER): Payer: PPO | Admitting: Urology

## 2023-01-24 VITALS — BP 112/74 | HR 112 | Ht 59.0 in | Wt 116.0 lb

## 2023-01-24 DIAGNOSIS — R339 Retention of urine, unspecified: Secondary | ICD-10-CM

## 2023-01-24 DIAGNOSIS — R3129 Other microscopic hematuria: Secondary | ICD-10-CM | POA: Diagnosis not present

## 2023-01-25 DIAGNOSIS — I700 Atherosclerosis of aorta: Secondary | ICD-10-CM | POA: Diagnosis not present

## 2023-01-25 DIAGNOSIS — M47812 Spondylosis without myelopathy or radiculopathy, cervical region: Secondary | ICD-10-CM | POA: Diagnosis not present

## 2023-01-25 DIAGNOSIS — E7800 Pure hypercholesterolemia, unspecified: Secondary | ICD-10-CM | POA: Diagnosis not present

## 2023-01-25 DIAGNOSIS — I5032 Chronic diastolic (congestive) heart failure: Secondary | ICD-10-CM | POA: Diagnosis not present

## 2023-01-25 DIAGNOSIS — G8929 Other chronic pain: Secondary | ICD-10-CM | POA: Diagnosis not present

## 2023-01-25 DIAGNOSIS — E039 Hypothyroidism, unspecified: Secondary | ICD-10-CM | POA: Diagnosis not present

## 2023-01-25 DIAGNOSIS — M8580 Other specified disorders of bone density and structure, unspecified site: Secondary | ICD-10-CM | POA: Diagnosis not present

## 2023-01-25 DIAGNOSIS — M5410 Radiculopathy, site unspecified: Secondary | ICD-10-CM | POA: Diagnosis not present

## 2023-01-25 DIAGNOSIS — E1151 Type 2 diabetes mellitus with diabetic peripheral angiopathy without gangrene: Secondary | ICD-10-CM | POA: Diagnosis not present

## 2023-01-25 DIAGNOSIS — E1140 Type 2 diabetes mellitus with diabetic neuropathy, unspecified: Secondary | ICD-10-CM | POA: Diagnosis not present

## 2023-01-25 DIAGNOSIS — M792 Neuralgia and neuritis, unspecified: Secondary | ICD-10-CM | POA: Diagnosis not present

## 2023-01-25 DIAGNOSIS — N1330 Unspecified hydronephrosis: Secondary | ICD-10-CM | POA: Diagnosis not present

## 2023-01-25 DIAGNOSIS — L89213 Pressure ulcer of right hip, stage 3: Secondary | ICD-10-CM | POA: Diagnosis not present

## 2023-01-25 DIAGNOSIS — L89154 Pressure ulcer of sacral region, stage 4: Secondary | ICD-10-CM | POA: Diagnosis not present

## 2023-01-25 DIAGNOSIS — M1A9XX0 Chronic gout, unspecified, without tophus (tophi): Secondary | ICD-10-CM | POA: Diagnosis not present

## 2023-01-25 DIAGNOSIS — I890 Lymphedema, not elsewhere classified: Secondary | ICD-10-CM | POA: Diagnosis not present

## 2023-01-25 DIAGNOSIS — G9341 Metabolic encephalopathy: Secondary | ICD-10-CM | POA: Diagnosis not present

## 2023-01-25 DIAGNOSIS — I82412 Acute embolism and thrombosis of left femoral vein: Secondary | ICD-10-CM | POA: Diagnosis not present

## 2023-01-25 DIAGNOSIS — M1712 Unilateral primary osteoarthritis, left knee: Secondary | ICD-10-CM | POA: Diagnosis not present

## 2023-01-25 DIAGNOSIS — L89613 Pressure ulcer of right heel, stage 3: Secondary | ICD-10-CM | POA: Diagnosis not present

## 2023-01-25 DIAGNOSIS — L89892 Pressure ulcer of other site, stage 2: Secondary | ICD-10-CM | POA: Diagnosis not present

## 2023-01-25 DIAGNOSIS — I110 Hypertensive heart disease with heart failure: Secondary | ICD-10-CM | POA: Diagnosis not present

## 2023-01-25 DIAGNOSIS — I70201 Unspecified atherosclerosis of native arteries of extremities, right leg: Secondary | ICD-10-CM | POA: Diagnosis not present

## 2023-01-25 DIAGNOSIS — I82422 Acute embolism and thrombosis of left iliac vein: Secondary | ICD-10-CM | POA: Diagnosis not present

## 2023-01-29 ENCOUNTER — Telehealth: Payer: Self-pay

## 2023-01-29 DIAGNOSIS — L89154 Pressure ulcer of sacral region, stage 4: Secondary | ICD-10-CM | POA: Diagnosis not present

## 2023-01-29 DIAGNOSIS — L89890 Pressure ulcer of other site, unstageable: Secondary | ICD-10-CM | POA: Diagnosis not present

## 2023-01-29 DIAGNOSIS — L89892 Pressure ulcer of other site, stage 2: Secondary | ICD-10-CM | POA: Diagnosis not present

## 2023-01-29 DIAGNOSIS — L89610 Pressure ulcer of right heel, unstageable: Secondary | ICD-10-CM | POA: Diagnosis not present

## 2023-01-29 DIAGNOSIS — L89213 Pressure ulcer of right hip, stage 3: Secondary | ICD-10-CM | POA: Diagnosis not present

## 2023-01-29 NOTE — Telephone Encounter (Signed)
Initial palliative care visit scheduled for [facility] 3/27 @ 10

## 2023-01-30 ENCOUNTER — Encounter: Payer: PPO | Attending: Internal Medicine | Admitting: Internal Medicine

## 2023-01-30 DIAGNOSIS — Z853 Personal history of malignant neoplasm of breast: Secondary | ICD-10-CM | POA: Insufficient documentation

## 2023-01-30 DIAGNOSIS — L89210 Pressure ulcer of right hip, unstageable: Secondary | ICD-10-CM

## 2023-01-30 DIAGNOSIS — I87312 Chronic venous hypertension (idiopathic) with ulcer of left lower extremity: Secondary | ICD-10-CM | POA: Insufficient documentation

## 2023-01-30 DIAGNOSIS — I5032 Chronic diastolic (congestive) heart failure: Secondary | ICD-10-CM | POA: Diagnosis not present

## 2023-01-30 DIAGNOSIS — M86172 Other acute osteomyelitis, left ankle and foot: Secondary | ICD-10-CM | POA: Insufficient documentation

## 2023-01-30 DIAGNOSIS — S91302A Unspecified open wound, left foot, initial encounter: Secondary | ICD-10-CM

## 2023-01-30 DIAGNOSIS — L89623 Pressure ulcer of left heel, stage 3: Secondary | ICD-10-CM | POA: Insufficient documentation

## 2023-01-30 DIAGNOSIS — I110 Hypertensive heart disease with heart failure: Secondary | ICD-10-CM | POA: Diagnosis not present

## 2023-01-30 DIAGNOSIS — L97822 Non-pressure chronic ulcer of other part of left lower leg with fat layer exposed: Secondary | ICD-10-CM | POA: Diagnosis not present

## 2023-01-30 DIAGNOSIS — L89320 Pressure ulcer of left buttock, unstageable: Secondary | ICD-10-CM | POA: Diagnosis not present

## 2023-01-30 DIAGNOSIS — I890 Lymphedema, not elsewhere classified: Secondary | ICD-10-CM | POA: Diagnosis not present

## 2023-01-30 DIAGNOSIS — L89620 Pressure ulcer of left heel, unstageable: Secondary | ICD-10-CM | POA: Diagnosis not present

## 2023-01-30 DIAGNOSIS — L89513 Pressure ulcer of right ankle, stage 3: Secondary | ICD-10-CM

## 2023-01-30 DIAGNOSIS — L89610 Pressure ulcer of right heel, unstageable: Secondary | ICD-10-CM | POA: Insufficient documentation

## 2023-01-30 DIAGNOSIS — E11622 Type 2 diabetes mellitus with other skin ulcer: Secondary | ICD-10-CM | POA: Insufficient documentation

## 2023-01-31 DIAGNOSIS — R531 Weakness: Secondary | ICD-10-CM

## 2023-01-31 NOTE — Progress Notes (Signed)
[name], [name] ([id number]) [id number] Page 1 of 17 Visit Report for 01/30/2023 Arrival Information Details Patient Name: Date of Service: [facility] [place] [name]. 01/30/2023 1:00 PM Medical Record Number: [id number] Patient Account Number: [id number] Date of Birth/Sex: Treating RN: 1940/12/11 (82 y.o. [name] Primary Care August Gosser: Tracie Harrier Other Clinician: Massie Kluver Referring Enrico Eaddy: Treating Kileen Lange/Extender: Arn Medal Weeks in Treatment: 18 Visit Information History Since Last Visit All ordered tests and consults were completed: No Patient Arrived: Wheel Chair Added or deleted any medications: No Arrival Time: 13:01 Any new allergies or adverse reactions: No Transfer Assistance: EasyPivot Patient Lift Had a fall or experienced change in No Patient Identification Verified: Yes activities of daily living that may affect Secondary Verification Process Completed: Yes risk of falls: Patient Requires Transmission-Based Precautions: No Signs or symptoms of abuse/neglect since last visito No Patient Has Alerts: Yes Hospitalized since last visit: No Patient Alerts: DM II Implantable device outside of the clinic excluding No ABI R 1.30 TBI 1.12 cellular tissue based products placed in the center ABI [name] 1.27 TBI 1.21 since last visit: Pain Present Now: No Electronic Signature(s) Signed: 01/30/2023 5:18:06 PM By: Massie Kluver Entered By: Massie Kluver on 01/30/2023 13:07:51 -------------------------------------------------------------------------------- Clinic Level of Care Assessment Details Patient Name: Date of Service: [facility], DIA [name]. 01/30/2023 1:00 PM Medical Record Number: [id number] Patient Account Number: [id number] Date of Birth/Sex: Treating RN: 10-01-1941 (82 y.o. [name] Primary Care Lief Palmatier: Tracie Harrier Other Clinician: Massie Kluver Referring [place] Kuang: Treating  Celina Shiley/Extender: Arn Medal Weeks in Treatment: 18 Clinic Level of Care Assessment Items TOOL 1 Quantity Score []  - 0 Use when EandM and Procedure is performed on INITIAL visit ASSESSMENTS - Nursing Assessment / Reassessment []  - 0 General Physical Exam (combine w/ comprehensive assessment (listed just below) when performed on new pt. evals) []  - 0 Comprehensive Assessment (HX, ROS, Risk Assessments, Wounds Hx, etc.) Revak, [name] ([id number]) [id number] Page 2 of 17 ASSESSMENTS - Wound and Skin Assessment / Reassessment []  - 0 Dermatologic / Skin Assessment (not related to wound area) ASSESSMENTS - Ostomy and/or Continence Assessment and Care []  - 0 Incontinence Assessment and Management []  - 0 Ostomy Care Assessment and Management (repouching, etc.) PROCESS - Coordination of Care []  - 0 Simple Patient / Family Education for ongoing care []  - 0 Complex (extensive) Patient / Family Education for ongoing care []  - 0 Staff obtains Programmer, systems, Records, T Results / Process Orders est []  - 0 Staff telephones HHA, Nursing Homes / Clarify orders / etc []  - 0 Routine Transfer to another Facility (non-emergent condition) []  - 0 Routine Hospital Admission (non-emergent condition) []  - 0 New Admissions / Biomedical engineer / Ordering NPWT Apligraf, etc. , []  - 0 Emergency Hospital Admission (emergent condition) PROCESS - Special Needs []  - 0 Pediatric / Minor Patient Management []  - 0 Isolation Patient Management []  - 0 Hearing / Language / Visual special needs []  - 0 Assessment of Community assistance (transportation, D/C planning, etc.) []  - 0 Additional assistance / Altered mentation []  - 0 Support Surface(s) Assessment (bed, cushion, seat, etc.) INTERVENTIONS - Miscellaneous []  - 0 External ear exam []  - 0 Patient Transfer (multiple staff / Civil Service fast streamer / Similar devices) []  - 0 Simple Staple / Suture  removal (25 or less) []  - 0 Complex Staple / Suture removal (26 or more) []  - 0 Hypo/Hyperglycemic Management (do not check if billed separately) []  - 0 Ankle / Brachial Index (  ABI) - do not check if billed separately Has the patient been seen at the hospital within the last three years: Yes Total Score: 0 Level Of Care: ____ Electronic Signature(s) Signed: 01/30/2023 5:18:06 PM By: Massie Kluver Entered By: Massie Kluver on 01/30/2023 14:01:37 -------------------------------------------------------------------------------- Encounter Discharge Information Details Patient Name: Date of Service: [facility], DIA [name]. 01/30/2023 1:00 PM Medical Record Number: [id number] Patient Account Number: [id number] Date of Birth/Sex: Treating RN: 07-Apr-1941 (82 y.o. [name] Primary Care Kamau Weatherall: Tracie Harrier Other Clinician: Massie Kluver Referring Ramello Cordial: Treating Chenise Mulvihill/Extender: Arn Medal Weeks in Treatment: [address] [name] ([id number]) [id number] Page 3 of 17 Encounter Discharge Information Items Post Procedure Vitals Discharge Condition: Stable Temperature (F): 98.2 Ambulatory Status: Walker Pulse (bpm): 77 Discharge Destination: Home Respiratory Rate (breaths/min): 16 Transportation: Private Auto Blood Pressure (mmHg): 84/51 Accompanied By: son Schedule Follow-up Appointment: Yes Clinical Summary of Care: Electronic Signature(s) Signed: 01/30/2023 5:18:06 PM By: Massie Kluver Entered By: Massie Kluver on 01/30/2023 16:23:24 -------------------------------------------------------------------------------- Lower Extremity Assessment Details Patient Name: Date of Service: [facility], DIA [name]. 01/30/2023 1:00 PM Medical Record Number: [id number] Patient Account Number: [id number] Date of Birth/Sex: Treating RN: 1941-07-29 (82 y.o. [name] Primary Care Ignatz Deis: Tracie Harrier Other Clinician:  Massie Kluver Referring Shaivi Rothschild: Treating Dezaree Tracey/Extender: Arn Medal Weeks in Treatment: 18 Edema Assessment Assessed: [Left: No] [Right: No] [Left: Edema] [Right: :] Calf Left: Right: Point of Measurement: 27 cm From Medial Instep Ankle Left: Right: Point of Measurement: 9 cm From Medial Instep Electronic Signature(s) Signed: 01/30/2023 5:15:10 PM By: Gretta Cool, BSN, RN, CWS, Kim RN, BSN Signed: 01/30/2023 5:18:06 PM By: Massie Kluver Entered By: Massie Kluver on 01/30/2023 13:37:59 -------------------------------------------------------------------------------- Multi Wound Chart Details Patient Name: Date of Service: [facility], DIA [name]. 01/30/2023 1:00 PM Medical Record Number: [id number] Patient Account Number: [id number] Date of Birth/Sex: Treating RN: [birth year] (82 y.o. [name] [place], [name] ([id number]) [phone].pdf Page 4 of 17 Primary Care Khayri Kargbo: Tracie Harrier Other Clinician: Massie Kluver Referring Obe Ahlers: Treating Nazariah Cadet/Extender: Tanna Furry, Vishwanath Weeks in Treatment: 18 Vital Signs Height(in): Pulse(bpm): 53 Weight(lbs): 116 Blood Pressure(mmHg): 84/51 Body Mass Index(BMI): Temperature(F): 98.2 Respiratory Rate(breaths/min): 16 [10:Photos:] Left Metatarsal head first Right Ischial Tuberosity Right, Medial Ankle Wound Location: Pressure Injury Pressure Injury Pressure Injury Wounding Event: Pressure Ulcer Pressure Ulcer Pressure Ulcer Primary Etiology: Cataracts, Lymphedema, Cataracts, Lymphedema, Cataracts, Lymphedema, Comorbid History: Hypertension, Peripheral Arterial Hypertension, Peripheral Arterial Hypertension, Peripheral Arterial Disease, Peripheral Venous Disease, Disease, Peripheral Venous Disease, Disease, Peripheral Venous Disease, Type II Diabetes, Osteoarthritis, Type II Diabetes, Osteoarthritis, Type II Diabetes, Osteoarthritis, Neuropathy  Neuropathy Neuropathy 10/23/2022 10/24/2022 12/05/2022 Date Acquired: 14 13 8  Weeks of Treatment: Open Open Open Wound Status: No No No Wound Recurrence: 0.4x0.7x0.1 4.4x6x0.4 2.5x3x0.1 Measurements [name] x W x D (cm) 0.22 20.735 5.89 A (cm) : rea 0.022 8.294 0.589 Volume (cm) : 94.40% -900.20% -50.00% % Reduction in A rea: 97.20% -3906.80% -49.90% % Reduction in Volume: 12 Starting Position 1 (o'clock): 12 Ending Position 1 (o'clock): 3 Maximum Distance 1 (cm): N/A Yes N/A Undermining: Category/Stage IV Unstageable/Unclassified Category/Stage I Classification: Large None Present Medium Exudate A mount: Serosanguineous N/A Serosanguineous Exudate Type: red, brown N/A red, brown Exudate Color: No N/A No Foul Odor A Cleansing: fter N/A N/A N/A Odor A nticipated Due to Product Use: Flat and Intact Flat and Intact Flat and Intact Wound Margin: None Present (0%) N/A N/A Granulation A mount: Medium (34-66%) N/A N/A Necrotic A mount: Necrosis of  Bone Eschar N/A Necrotic Tissue: Fat Layer (Subcutaneous Tissue): Yes Fat Layer (Subcutaneous Tissue): Yes Fat Layer (Subcutaneous Tissue): Yes Exposed Structures: Bone: Yes Fascia: No Fascia: No Fascia: No Tendon: No Tendon: No Tendon: No Muscle: No Muscle: No Muscle: No Joint: No Joint: No Joint: No Bone: No Bone: No None None None Epithelialization: Wound Number: [id number]  Photos: Left Calcaneus Right Calcaneus Midline Sacrum Wound Location: Pressure Injury Pressure Injury Pressure Injury Wounding Event: Pressure Ulcer Pressure Ulcer Pressure Ulcer Primary Etiology: Cataracts, Lymphedema, Cataracts, Lymphedema, Cataracts, Lymphedema, Comorbid History: Hypertension, Peripheral Arterial Hypertension, Peripheral Arterial Hypertension, Peripheral Arterial Disease, Peripheral Venous Disease, Disease, Peripheral Venous Disease, Disease, Peripheral Venous Disease, Type II Diabetes, Osteoarthritis, Type II  Diabetes, Osteoarthritis, Type II Diabetes, Osteoarthritis, Neuropathy Neuropathy Neuropathy Selke, [name] ([id number]) [id number] Page 5 of 17 11/28/2022 07/23/2022 07/23/2022 Date Acquired: 8 18 18  Weeks of Treatment: Open Open Open Wound Status: No No No Wound Recurrence: 1.7x1.7x0.1 1x2.5x0.1 1x0.8x0.3 Measurements [name] x W x D (cm) 2.27 1.963 0.628 A (cm) : rea 0.227 0.196 0.188 Volume (cm) : 34.30% 79.20% 88.60% % Reduction in A rea: 34.40% 93.10% 88.60% % Reduction in Volume: 12 Starting Position 1 (o'clock): 5 Ending Position 1 (o'clock): 0.6 Maximum Distance 1 (cm): N/A N/A Yes Undermining: Category/Stage II Unstageable/Unclassified Unstageable/Unclassified Classification: Medium Medium Medium Exudate A mount: Serosanguineous Serosanguineous Serosanguineous Exudate Type: red, brown red, brown red, brown Exudate Color: No Yes Yes Foul Odor A Cleansing: fter N/A No No Odor A nticipated Due to Product Use: Flat and Intact N/A N/A Wound Margin: None Present (0%) None Present (0%) None Present (0%) Granulation A mount: Large (67-100%) Large (67-100%) Large (67-100%) Necrotic A mount: Eschar Eschar Adherent Slough Necrotic Tissue: Fat Layer (Subcutaneous Tissue): Yes Fat Layer (Subcutaneous Tissue): Yes Fat Layer (Subcutaneous Tissue): Yes Exposed Structures: Fascia: No Fascia: No Fascia: No Tendon: No Tendon: No Tendon: No Muscle: No Muscle: No Muscle: No Joint: No Joint: No Joint: No Bone: No Bone: No Bone: No None None None Epithelialization: Treatment Notes Electronic Signature(s) Signed: 01/30/2023 5:18:06 PM By: Massie Kluver Entered By: Massie Kluver on 01/30/2023 13:38:14 -------------------------------------------------------------------------------- Multi-Disciplinary Care Plan Details Patient Name: Date of Service: [facility], DIA [name]. 01/30/2023 1:00 PM Medical Record Number: [id number] Patient  Account Number: [id number] Date of Birth/Sex: Treating RN: November 12, 1941 (82 y.o. [name] Primary Care Audie Stayer: Tracie Harrier Other Clinician: Massie Kluver Referring Derrick Tiegs: Treating Grant Henkes/Extender: Arn Medal Weeks in Treatment: 66 Active Inactive Necrotic Tissue Nursing Diagnoses: Impaired tissue integrity related to necrotic/devitalized tissue Knowledge deficit related to management of necrotic/devitalized tissue Goals: Necrotic/devitalized tissue will be minimized in the wound bed Date Initiated: 10/31/2022 Target Resolution Date: 11/07/2022 Goal Status: Active Patient/caregiver will verbalize understanding of reason and process for debridement of necrotic tissue Date Initiated: 10/31/2022 Target Resolution Date: 10/31/2022 Goal Status: Active [name], [name] ([id number]) [id number] Page 6 of 17 Interventions: Assess patient pain level pre-, during and post procedure and prior to discharge Provide education on necrotic tissue and debridement process Treatment Activities: Apply topical anesthetic as ordered : 10/31/2022 Biologic debridement : 10/31/2022 Notes: Osteomyelitis Nursing Diagnoses: Potential for infection: osteomyelitis Goals: Patient/caregiver will verbalize understanding of disease process and disease management Date Initiated: 10/31/2022 Target Resolution Date: 10/31/2022 Goal Status: Active Signs and symptoms for osteomyelitis will be recognized and promptly addressed Date Initiated: 10/31/2022 Target Resolution Date: 10/31/2022 Goal Status: Active Interventions: Assess for signs and symptoms of osteomyelitis resolution every visit Treatment Activities: Systemic antibiotics : 10/31/2022 Notes: Soft Tissue  Infection Nursing Diagnoses: Impaired tissue integrity Potential for infection: soft tissue Goals: Patient/caregiver will verbalize understanding of or measures to prevent  infection and contamination in the home setting Date Initiated: 10/31/2022 Target Resolution Date: 10/31/2022 Goal Status: Active Signs and symptoms of infection will be recognized early to allow for prompt treatment Date Initiated: 10/31/2022 Target Resolution Date: 10/31/2022 Goal Status: Active Interventions: Assess signs and symptoms of infection every visit Notes: Wound/Skin Impairment Nursing Diagnoses: Impaired tissue integrity Goals: Patient/caregiver will verbalize understanding of skin care regimen Date Initiated: 10/31/2022 Date Inactivated: 11/06/2022 Target Resolution Date: 10/31/2022 Goal Status: Met Ulcer/skin breakdown will have a volume reduction of 30% by week 4 Date Initiated: 10/31/2022 Date Inactivated: 11/06/2022 Target Resolution Date: 10/31/2022 Unmet Reason: comorbidities, failure to Goal Status: Unmet thrive Ulcer/skin breakdown will have a volume reduction of 50% by week 8 Date Initiated: 11/06/2022 Target Resolution Date: 11/28/2022 Goal Status: Active Interventions: Assess patient/caregiver ability to perform ulcer/skin care regimen upon admission and as needed Assess ulceration(s) every visit Provide education on ulcer and skin care Treatment Activities: Skin care regimen initiated : 10/31/2022 Topical wound management initiated : 10/31/2022 Notes: [name], [name] ([id number]) [id number] Page 7 of 17 Electronic Signature(s) Signed: 01/30/2023 5:15:10 PM By: Gretta Cool, BSN, RN, CWS, Kim RN, BSN Signed: 01/30/2023 5:18:06 PM By: Massie Kluver Entered By: Massie Kluver on 01/30/2023 14:26:48 -------------------------------------------------------------------------------- Pain Assessment Details Patient Name: Date of Service: [facility], DIA [name]. 01/30/2023 1:00 PM Medical Record Number: [id number] Patient Account Number: [id number] Date of Birth/Sex: Treating RN: 05-27-1941 (82 y.o. [name] Primary Care  Aveen Stansel: Tracie Harrier Other Clinician: Massie Kluver Referring Gladstone Rosas: Treating Shemekia Patane/Extender: Arn Medal Weeks in Treatment: 41 Active Problems Location of Pain Severity and Description of Pain Patient Has Paino Yes Site Locations Pain Location: Pain in Ulcers Duration of the Pain. Constant / Intermittento Constant Rate the pain. Current Pain Level: 8 Character of Pain Describe the Pain: Sharp Pain Management and Medication Current Pain Management: Medication: Yes Cold Application: No Rest: Yes Massage: No Activity: No T.E.N.S.: No Heat Application: No Leg drop or elevation: No Is the Current Pain Management Adequate: Inadequate How does your wound impact your activities of daily livingo Sleep: No Bathing: No Appetite: No Relationship With Others: No Bladder Continence: No Emotions: No Bowel Continence: No Work: No Toileting: No Drive: No Dressing: No Hobbies: No Engineer, maintenance) Signed: 01/30/2023 5:15:10 PM By: Gretta Cool, BSN, RN, CWS, Kim RN, BSN Signed: 01/30/2023 5:18:06 PM By: Lester Kinsman, [name] ([id number]) [id number] Page 8 of 17 Entered By: Massie Kluver on 01/30/2023 13:12:10 -------------------------------------------------------------------------------- Patient/Caregiver Education Details Patient Name: Date of Service: [facility], DIA [name]. 3/20/2024andnbsp1:00 PM Medical Record Number: [id number] Patient Account Number: [id number] Date of Birth/Gender: Treating RN: 05-Jun-1941 (82 y.o. [name] Primary Care Physician: Tracie Harrier Other Clinician: Massie Kluver Referring Physician: Treating Physician/Extender: Arn Medal Weeks in Treatment: 37 Education Assessment Education Provided To: Patient and Caregiver Education Topics Provided Wound/Skin Impairment: Handouts: Other: continue wound care as directed Methods:  Explain/Verbal Responses: State content correctly Electronic Signature(s) Signed: 01/30/2023 5:18:06 PM By: Massie Kluver Entered By: Massie Kluver on 01/30/2023 14:52:23 -------------------------------------------------------------------------------- Wound Assessment Details Patient Name: Date of Service: [facility], DIA [name]. 01/30/2023 1:00 PM Medical Record Number: [id number] Patient Account Number: [id number] Date of Birth/Sex: Treating RN: March 03, 1941 (82 y.o. [name] Primary Care Wiley Flicker: Tracie Harrier Other Clinician: Massie Kluver Referring Aldean Pipe: Treating Marqus Macphee/Extender: Arn Medal Weeks in Treatment: 18 Wound  Status Wound Number: 10 Primary Pressure Ulcer Etiology: Wound Location: Left Metatarsal head first Wound Open Wounding Event: Pressure Injury Status: Date Acquired: 10/23/2022 Comorbid Cataracts, Lymphedema, Hypertension, Peripheral Arterial Disease, Weeks Of Treatment: 14 History: Peripheral Venous Disease, Type II Diabetes, Osteoarthritis, Clustered Wound: No Neuropathy Photos Coombs, [name] ([id number]) [id number] Page 9 of 17 Wound Measurements Length: (cm) 0.4 Width: (cm) 0.7 Depth: (cm) 0.1 Area: (cm) 0.22 Volume: (cm) 0.022 % Reduction in Area: 94.4% % Reduction in Volume: 97.2% Epithelialization: None Wound Description Classification: Category/Stage IV Wound Margin: Flat and Intact Exudate Amount: Large Exudate Type: Serosanguineous Exudate Color: red, brown Foul Odor After Cleansing: No Slough/Fibrino No Wound Bed Granulation Amount: None Present (0%) Exposed Structure Necrotic Amount: Medium (34-66%) Fascia Exposed: No Necrotic Quality: Bone Fat Layer (Subcutaneous Tissue) Exposed: Yes Tendon Exposed: No Muscle Exposed: No Joint Exposed: No Bone Exposed: Yes Treatment Notes Wound #10 (Metatarsal head first) Wound Laterality: Left Cleanser Soap and  Water Discharge Instruction: Gently cleanse wound with antibacterial soap, rinse and pat dry prior to dressing wounds Vashe 5.8 (oz) Discharge Instruction: Use vashe 5.8 (oz) as directed Peri-Wound Care Topical Primary Dressing Honey: Activon Honey Gel, 25 (g) Tube Hydrofera Blue Ready Transfer Foam, 2.5x2.5 (in/in) Discharge Instruction: Apply Hydrofera Blue Ready to wound bed as directed Secondary Dressing ABD Pad 5x9 (in/in) Discharge Instruction: Cover with ABD pad Secured With Medipore T - 23M Medipore H Soft Cloth Surgical T ape ape, 2x2 (in/yd) Compression Wrap Compression Stockings Environmental education officer) Signed: 01/30/2023 5:15:10 PM By: Gretta Cool, BSN, RN, CWS, Kim RN, BSN Signed: 01/30/2023 5:18:06 PM By: Massie Kluver Entered By: Massie Kluver on 01/30/2023 13:33:31 [name], [name] ([id number]) [id number] Page 10 of 17 -------------------------------------------------------------------------------- Wound Assessment Details Patient Name: Date of Service: [facility], DIA [name]. 01/30/2023 1:00 PM Medical Record Number: [id number] Patient Account Number: [id number] Date of Birth/Sex: Treating RN: 1941/10/01 (82 y.o. Charolette Forward, Kim Primary Care Darlyne Schmiesing: Tracie Harrier Other Clinician: Massie Kluver Referring Oreoluwa Aigner: Treating Meleana Commerford/Extender: Arn Medal Weeks in Treatment: 18 Wound Status Wound Number: 11 Primary Pressure Ulcer Etiology: Wound Location: Right Ischial Tuberosity Wound Open Wounding Event: Pressure Injury Status: Date Acquired: 10/24/2022 Comorbid Cataracts, Lymphedema, Hypertension, Peripheral Arterial Disease, Weeks Of Treatment: 13 History: Peripheral Venous Disease, Type II Diabetes, Osteoarthritis, Clustered Wound: No Neuropathy Photos Wound Measurements Length: (cm) 4.4 Width: (cm) 6 Depth: (cm) 0.4 Area: (cm) 20.735 Volume: (cm) 8.294 % Reduction in Area: -900.2% %  Reduction in Volume: -3906.8% Epithelialization: None Undermining: Yes Starting Position (o'clock): 12 Ending Position (o'clock): 12 Maximum Distance: (cm) 3 Wound Description Classification: Unstageable/Unclassified Wound Margin: Flat and Intact Exudate Amount: None Present Wound Bed Necrotic Amount: Medium (34-66%) Exposed Structure Necrotic Quality: Eschar Fascia Exposed: No Fat Layer (Subcutaneous Tissue) Exposed: Yes Tendon Exposed: No Muscle Exposed: No Joint Exposed: No Bone Exposed: No Treatment Notes Wound #11 (Ischial Tuberosity) Wound Laterality: Right Cleanser Soap and Water Discharge Instruction: Gently cleanse wound with antibacterial soap, rinse and pat dry prior to dressing wounds Auriemma, [name] ([id number]) [id number] Page 11 of 17 Vashe 5.8 (oz) Discharge Instruction: Use vashe 5.8 (oz) as directed Peri-Wound Care Topical Primary Dressing Gauze Discharge Instruction: Vashe wet to dry Secondary Dressing (BORDER) Zetuvit Plus SILICONE BORDER Dressing 5x5 (in/in) Discharge Instruction: Please do not put silicone bordered dressings under wraps. Use non-bordered dressing only. Secured With Compression Wrap Compression Stockings Environmental education officer) Signed: 01/30/2023 5:15:10 PM By: Gretta Cool, BSN, RN, CWS, Kim RN, BSN Signed: 01/30/2023 5:18:06 PM By: Massie Kluver  Entered By: Massie Kluver on 01/30/2023 13:34:25 -------------------------------------------------------------------------------- Wound Assessment Details Patient Name: Date of Service: [facility], DIA [name]. 01/30/2023 1:00 PM Medical Record Number: [id number] Patient Account Number: [id number] Date of Birth/Sex: Treating RN: [birth year] (82 y.o. Charolette Forward, Kim Primary Care Breven Guidroz: Tracie Harrier Other Clinician: Massie Kluver Referring Tylisa Alcivar: Treating Kirra Verga/Extender: Arn Medal Weeks in Treatment: 18 Wound  Status Wound Number: 12 Primary Pressure Ulcer Etiology: Wound Location: Right, Medial Ankle Wound Open Wounding Event: Pressure Injury Status: Date Acquired: 12/05/2022 Comorbid Cataracts, Lymphedema, Hypertension, Peripheral Arterial Disease, Weeks Of Treatment: 8 History: Peripheral Venous Disease, Type II Diabetes, Osteoarthritis, Clustered Wound: No Neuropathy Photos Wound Measurements Length: (cm) 2.5 [name], [name] ([id number]) Width: (cm) 3 Depth: (cm) 0.1 Area: (cm) 5.89 Volume: (cm) 0.589 % Reduction in Area: -50% [id number] Page 12 of 17 % Reduction in Volume: -49.9% Epithelialization: None Wound Description Classification: Category/Stage I Wound Margin: Flat and Intact Exudate Amount: Medium Exudate Type: Serosanguineous Exudate Color: red, brown Foul Odor After Cleansing: No Slough/Fibrino Yes Wound Bed Exposed Structure Fascia Exposed: No Fat Layer (Subcutaneous Tissue) Exposed: Yes Tendon Exposed: No Muscle Exposed: No Joint Exposed: No Bone Exposed: No Treatment Notes Wound #12 (Ankle) Wound Laterality: Right, Medial Cleanser Soap and Water Discharge Instruction: Gently cleanse wound with antibacterial soap, rinse and pat dry prior to dressing wounds Vashe 5.8 (oz) Discharge Instruction: Use vashe 5.8 (oz) as directed Peri-Wound Care Topical Primary Dressing Honey: Activon Honey Gel, 25 (g) Tube Secondary Dressing Hydrofera Blue Ready Transfer Foam, 2.5x2.5 (in/in) Discharge Instruction: Apply to wound bed over non-stick dressing. (BORDER) Zetuvit Plus SILICONE BORDER Dressing 5x5 (in/in) Discharge Instruction: Please do not put silicone bordered dressings under wraps. Use non-bordered dressing only. Secured With [place] H Soft Cloth Surgical T ape ape, 2x2 (in/yd) Compression Wrap Compression Stockings Environmental education officer) Signed: 01/30/2023 5:15:10 PM By: Gretta Cool, BSN, RN, CWS,  Kim RN, BSN Signed: 01/30/2023 5:18:06 PM By: Massie Kluver Entered By: Massie Kluver on 01/30/2023 13:35:26 -------------------------------------------------------------------------------- Wound Assessment Details Patient Name: Date of Service: [facility], DIA [name]. 01/30/2023 1:00 PM Medical Record Number: [id number] Patient Account Number: [id number] [name], [name] ([id number]) [id number] Page 13 of 17 Date of Birth/Sex: Treating RN: [birth year] (82 y.o. Charolette Forward, Kim Primary Care Rhina Kramme: Tracie Harrier Other Clinician: Massie Kluver Referring Analicia Skibinski: Treating Alwilda Gilland/Extender: Arn Medal Weeks in Treatment: 18 Wound Status Wound Number: 13 Primary Pressure Ulcer Etiology: Wound Location: Left Calcaneus Wound Open Wounding Event: Pressure Injury Status: Date Acquired: 11/28/2022 Comorbid Cataracts, Lymphedema, Hypertension, Peripheral Arterial Disease, Weeks Of Treatment: 8 History: Peripheral Venous Disease, Type II Diabetes, Osteoarthritis, Clustered Wound: No Neuropathy Photos Wound Measurements Length: (cm) 1.7 Width: (cm) 1.7 Depth: (cm) 0.1 Area: (cm) 2.27 Volume: (cm) 0.227 % Reduction in Area: 34.3% % Reduction in Volume: 34.4% Epithelialization: None Wound Description Classification: Category/Stage II Wound Margin: Flat and Intact Exudate Amount: Medium Exudate Type: Serosanguineous Exudate Color: red, brown Foul Odor After Cleansing: No Slough/Fibrino Yes Wound Bed Granulation Amount: None Present (0%) Exposed Structure Necrotic Amount: Large (67-100%) Fascia Exposed: No Necrotic Quality: Eschar Fat Layer (Subcutaneous Tissue) Exposed: Yes Tendon Exposed: No Muscle Exposed: No Joint Exposed: No Bone Exposed: No Treatment Notes Wound #13 (Calcaneus) Wound Laterality: Left Cleanser Soap and Water Discharge Instruction: Gently cleanse wound with antibacterial soap, rinse and pat dry  prior to dressing wounds Vashe 5.8 (oz) Discharge Instruction: Use vashe 5.8 (oz) as directed Peri-Wound Care Topical Primary Dressing Honey: Activon Honey  Gel, 25 (g) Tube Secondary Dressing Hydrofera Blue Ready Transfer Foam, 2.5x2.5 (in/in) Discharge Instruction: Apply to wound bed over non-stick dressing. Secured With [place] H Soft Cloth Surgical T ape ape, 2x2 (in/yd) Kerlix Roll Sterile or Non-Sterile 6-ply 4.5x4 (yd/yd) Mastandrea, [name] ([id number]) [id number] Page 14 of 17 Discharge Instruction: Apply Kerlix as directed Compression Wrap Compression Stockings Add-Ons Electronic Signature(s) Signed: 01/30/2023 5:15:10 PM By: Gretta Cool, BSN, RN, CWS, Kim RN, BSN Signed: 01/30/2023 5:18:06 PM By: Massie Kluver Entered By: Massie Kluver on 01/30/2023 13:36:06 -------------------------------------------------------------------------------- Wound Assessment Details Patient Name: Date of Service: [facility], DIA [name]. 01/30/2023 1:00 PM Medical Record Number: [id number] Patient Account Number: [id number] Date of Birth/Sex: Treating RN: Feb 03, 1941 (82 y.o. Charolette Forward, Kim Primary Care Nkechi Linehan: Tracie Harrier Other Clinician: Massie Kluver Referring Nedda Gains: Treating Dejean Tribby/Extender: Arn Medal Weeks in Treatment: 18 Wound Status Wound Number: 6 Primary Pressure Ulcer Etiology: Wound Location: Right Calcaneus Wound Open Wounding Event: Pressure Injury Status: Date Acquired: 07/23/2022 Comorbid Cataracts, Lymphedema, Hypertension, Peripheral Arterial Disease, Weeks Of Treatment: 18 History: Peripheral Venous Disease, Type II Diabetes, Osteoarthritis, Clustered Wound: No Neuropathy Photos Wound Measurements Length: (cm) 1 Width: (cm) 2.5 Depth: (cm) 0.1 Area: (cm) 1.963 Volume: (cm) 0.196 % Reduction in Area: 79.2% % Reduction in Volume: 93.1% Epithelialization: None Wound  Description Classification: Unstageable/Unclassified Exudate Amount: Medium Exudate Type: Serosanguineous Exudate Color: red, brown Foul Odor After Cleansing: Yes Due to Product Use: No Slough/Fibrino Yes Wound Bed Granulation Amount: None Present (0%) Exposed Structure Necrotic Amount: Large (67-100%) Fascia Exposed: No [name], [name] ([id number]) [id number] Page 15 of 17 Necrotic Quality: Eschar Fat Layer (Subcutaneous Tissue) Exposed: Yes Tendon Exposed: No Muscle Exposed: No Joint Exposed: No Bone Exposed: No Treatment Notes Wound #6 (Calcaneus) Wound Laterality: Right Cleanser [place] Ancillary Kit - 15 Day Supply Discharge Instruction: Use supplies as instructed; Kit contains: (15) Saline Bullets; (15) 3x3 Gauze; 15 pr Gloves Soap and Water Discharge Instruction: Gently cleanse wound with antibacterial soap, rinse and pat dry prior to dressing wounds Vashe 5.8 (oz) Discharge Instruction: Use vashe 5.8 (oz) as directed Peri-Wound Care Topical Activon Honey Gel, 25 (g) Tube Primary Dressing Hydrofera Blue Ready Transfer Foam, 2.5x2.5 (in/in) Discharge Instruction: Apply Hydrofera Blue Ready to wound bed as directed Secondary Dressing ABD Pad 5x9 (in/in) Discharge Instruction: Cover with ABD pad Kerlix 4.5 x 4.1 (in/yd) Discharge Instruction: Apply Kerlix 4.5 x 4.1 (in/yd) as instructed Secured With Medipore T - 84M Medipore H Soft Cloth Surgical T ape ape, 2x2 (in/yd) Compression Wrap Compression Stockings Environmental education officer) Signed: 01/30/2023 5:15:10 PM By: Gretta Cool, BSN, RN, CWS, Kim RN, BSN Signed: 01/30/2023 5:18:06 PM By: Massie Kluver Entered By: Massie Kluver on 01/30/2023 13:36:46 -------------------------------------------------------------------------------- Wound Assessment Details Patient Name: Date of Service: [facility], DIA [name]. 01/30/2023 1:00 PM Medical Record Number: [id number] Patient Account Number:  [id number] Date of Birth/Sex: Treating RN: July 14, 1941 (82 y.o. [name] Primary Care Cherylin Waguespack: Tracie Harrier Other Clinician: Massie Kluver Referring Areon Cocuzza: Treating Sritha Chauncey/Extender: Arn Medal Weeks in Treatment: 18 Wound Status Wound Number: 8 Primary Pressure Ulcer Etiology: Wound Location: Midline Sacrum Wound Open Wounding Event: Pressure Injury Status: Date Acquired: 07/23/2022 Comorbid Cataracts, Lymphedema, Hypertension, Peripheral Arterial Disease, Weeks Of Treatment: [place], Charlese [name] ([id number]) [id number] Page 16 of 17 Weeks Of Treatment: 18 History: Peripheral Venous Disease, Type II Diabetes, Osteoarthritis, Clustered Wound: No Neuropathy Photos Wound Measurements Length: (cm) 1 Width: (cm) 0.8 Depth: (cm) 0.3 Area: (cm) 0.628  Volume: (cm) 0.188 % Reduction in Area: 88.6% % Reduction in Volume: 88.6% Epithelialization: None Undermining: Yes Starting Position (o'clock): 12 Ending Position (o'clock): 5 Maximum Distance: (cm) 0.6 Wound Description Classification: Unstageable/Unclassified Exudate Amount: Medium Exudate Type: Serosanguineous Exudate Color: red, brown Foul Odor After Cleansing: Yes Due to Product Use: No Slough/Fibrino Yes Wound Bed Granulation Amount: None Present (0%) Exposed Structure Necrotic Amount: Large (67-100%) Fascia Exposed: No Necrotic Quality: Adherent Slough Fat Layer (Subcutaneous Tissue) Exposed: Yes Tendon Exposed: No Muscle Exposed: No Joint Exposed: No Bone Exposed: No Treatment Notes Wound #8 (Sacrum) Wound Laterality: Midline Cleanser Soap and Water Discharge Instruction: Gently cleanse wound with antibacterial soap, rinse and pat dry prior to dressing wounds Vashe 5.8 (oz) Discharge Instruction: Use vashe 5.8 (oz) as directed Peri-Wound Care Topical Primary Dressing Gauze Discharge Instruction: moistened with Vashe Solution for wet to  dry dressing Secondary Dressing (BORDER) Zetuvit Plus SILICONE BORDER Dressing 5x5 (in/in) Discharge Instruction: Please do not put silicone bordered dressings under wraps. Use non-bordered dressing only. Secured With Compression Wrap Compression Stockings Environmental education officer) Signed: 01/30/2023 5:15:10 PM By: Gretta Cool, BSN, RN, CWS, Kim RN, BSN Signed: 01/30/2023 5:18:06 PM By: Lester Kinsman, [name] ([id number]) By: Waylan Rocher.pdf Page 17 of 17 Signed: 01/30/2023 5:18:06 PM Entered By: Massie Kluver on 01/30/2023 13:37:40 -------------------------------------------------------------------------------- Vitals Details Patient Name: Date of Service: [facility], DIA [name]. 01/30/2023 1:00 PM Medical Record Number: [id number] Patient Account Number: [id number] Date of Birth/Sex: Treating RN: 01-16-1941 (82 y.o. Charolette Forward, Kim Primary Care Zamya Culhane: Tracie Harrier Other Clinician: Massie Kluver Referring Dshawn Mcnay: Treating Kimblery Diop/Extender: Arn Medal Weeks in Treatment: 18 Vital Signs Time Taken: 13:09 Temperature (F): 98.2 Weight (lbs): 116 Pulse (bpm): 77 Respiratory Rate (breaths/min): 16 Blood Pressure (mmHg): 84/51 Reference Range: 80 - 120 mg / dl Electronic Signature(s) Signed: 01/30/2023 5:18:06 PM By: Massie Kluver Entered By: Massie Kluver on 01/30/2023 13:12:05

## 2023-02-01 NOTE — Progress Notes (Signed)
[name], [name] ([id number]) [id number] Page 1 of 14 Visit Report for 01/30/2023 Chief Complaint Document Details Patient Name: Date of Service: [facility] ([facility] [name] 01/30/2023 1:00 PM Medical Record Number: [id number] Patient Account Number: [id number] Date of Birth/Sex: Treating RN: [birth year] (82 y.o. [name] Primary Care Provider: Tracie Harrier Other Clinician: Massie Kluver Referring Provider: Treating Provider/Extender: Arn Medal Weeks in Treatment: 70 Information Obtained from: Patient Chief Complaint 09/26/2022; left lower extremity wound, right heel wound and left buttocks wound Electronic Signature(s) Signed: 01/30/2023 2:38:02 PM By: Kalman Shan DO Entered By: Kalman Shan on 01/30/2023 13:53:45 -------------------------------------------------------------------------------- Debridement Details Patient Name: Date of Service: [facility], [name] 01/30/2023 1:00 PM Medical Record Number: [id number] Patient Account Number: [id number] Date of Birth/Sex: Treating RN: [birth year] (82 y.o. [name] Primary Care Provider: Tracie Harrier Other Clinician: Massie Kluver Referring Provider: Treating Provider/Extender: Arn Medal Weeks in Treatment: 18 Debridement Performed for Assessment: Wound #11 Right Ischial Tuberosity Performed By: Physician Kalman Shan, MD Debridement Type: Debridement Level of Consciousness (Pre-procedure): Awake and Alert Pre-procedure Verification/Time Out Yes - 13:51 Taken: Start Time: 13:51 T Area Debrided (L x W): otal 4 (cm) x 4 (cm) = 16 (cm) Tissue and other material debrided: Non-Viable, Eschar Level: Non-Viable Tissue Debridement Description: Selective/Open Wound Instrument: Forceps, Scissors Bleeding: Minimum Hemostasis Achieved: Pressure Response to Treatment: Procedure was tolerated well Level of Consciousness (Post- Awake  and Alert procedure): Post Debridement Measurements of Total Wound Wire, Anarie L ([id number]) [id number] Page 2 of 14 Length: (cm) 4.4 Stage: Unstageable/Unclassified Width: (cm) 6 Depth: (cm) 0.4 Volume: (cm) 8.294 Character of Wound/Ulcer Post Debridement: Stable Post Procedure Diagnosis Same as Pre-procedure Electronic Signature(s) Signed: 01/30/2023 2:38:02 PM By: Kalman Shan DO Signed: 01/30/2023 5:15:10 PM By: Gretta Cool, BSN, RN, CWS, Kim RN, BSN Signed: 01/30/2023 5:18:06 PM By: Massie Kluver Entered By: Massie Kluver on 01/30/2023 13:51:56 -------------------------------------------------------------------------------- Debridement Details Patient Name: Date of Service: [facility], [name] 01/30/2023 1:00 PM Medical Record Number: [id number] Patient Account Number: [id number] Date of Birth/Sex: Treating RN: Mar 19, 1941 (82 y.o. [name] Primary Care Provider: Tracie Harrier Other Clinician: Massie Kluver Referring Provider: Treating Provider/Extender: Arn Medal Weeks in Treatment: 18 Debridement Performed for Assessment: Wound #13 Left Calcaneus Performed By: Physician Kalman Shan, MD Debridement Type: Debridement Level of Consciousness (Pre-procedure): Awake and Alert Pre-procedure Verification/Time Out Yes - 13:48 Taken: Start Time: 13:48 T Area Debrided (L x W): otal 1.7 (cm) x 1.7 (cm) = 2.89 (cm) Tissue and other material debrided: Viable, Non-Viable, Eschar, Slough, Subcutaneous, Slough Level: Skin/Subcutaneous Tissue Debridement Description: Excisional Instrument: Curette Bleeding: Minimum Hemostasis Achieved: Pressure Response to Treatment: Procedure was tolerated well Level of Consciousness (Post- Awake and Alert procedure): Post Debridement Measurements of Total Wound Length: (cm) 1.7 Stage: Category/Stage II Width: (cm) 1.7 Depth: (cm) 0.2 Volume: (cm) 0.454 Character of  Wound/Ulcer Post Debridement: Stable Post Procedure Diagnosis Same as Pre-procedure Electronic Signature(s) Signed: 01/30/2023 2:38:02 PM By: Kalman Shan DO Signed: 01/30/2023 5:15:10 PM By: Gretta Cool, BSN, RN, CWS, Kim RN, BSN Signed: 01/30/2023 5:18:06 PM By: Massie Kluver Entered By: Massie Kluver on 01/30/2023 13:52:35 [name], [name] ([id number]) [id number] Page 3 of 14 -------------------------------------------------------------------------------- HPI Details Patient Name: Date of Service: [facility], [name] 01/30/2023 1:00 PM Medical Record Number: [id number] Patient Account Number: [id number] Date of Birth/Sex: Treating RN: Apr 05, 1941 (82 y.o. [name] Primary Care Provider: Tracie Harrier Other Clinician: Massie Kluver Referring Provider: Treating Provider/Extender: Tanna Furry,  Vishwanath Weeks in Treatment: 18 History of Present Illness HPI Description: 82 year old patient who comes with a referral for bilateral lower extremity edema and a lower extremity ulceration and has been sent by her PCP Dr. Placido Sou. I understand the patient was recently put on amoxicillin and doxycycline but could not tolerate the amoxicillin. doxycycline course was completed. a BNP and EKG was supposed to be normal and the patient did not have any dyspnea. the patient has been on a diuretic. The patient was also prescribed a pair of elastic compression stockings of the 20-30 mmHg pressure variety. x-ray of the right ankle was done on 09/20/2015 and showed posttraumatic and postsurgical changes of the right ankle with secondary degenerative changes of the tibiotalar joint and to a lesser degree the subtalar joint. No definite acute bony abnormalities are noted. Past medical history significant for diabetes mellitus, hypertension, hyperlipidemia, right breast cancer treated with a mastectomy in 2014. She has never smoked. 10/24/2015 -- she had  delayed her vascular test because of her husband surgery but she is now ready to get him taken care of. He is also unable to use compression stockings and hence we will need to order her Juzo wraps. 10/31/2015-- was seen by Dr. Lucky Cowboy on 10/28/2015. She had a left lower extremity arterial duplex done at his office a couple of years ago and that was essentially normal. Today they performed a venous duplex which revealed no evidence of deep vein thrombosis, superficial thrombophlebitis, no venous reflex was seen on the right and a minimal amount of reflux was seen on the left great saphenous vein but no significant reflux was seen. Impression was that there was a component of lymphedema present from a previous surgery and he would recommend compression stockings and leg elevation. Readmission: 07-24-2022 upon evaluation today patient presents for initial inspection here in our clinic concerning issues that she has been having with her legs this is actually been going on for several years according to what her family member with her today tells me as well as what the patient reiterates as well. She is currently most recently been seeing Dr. Vickki Muff and subsequently he had her in [place] boots. However 2 weeks ago he referred her to [place] and then subsequently took her out of the [place] boot wraps at that point. At this time the left leg looks to be worse in the right leg currently. She is on Lasix and lisinopril with hydrochlorothiazide she has high blood pressure she also has issues currently with lower extremity swelling and edema which has been an ongoing issue for her as well. Patient does have a history of chronic venous hypertension, lymphedema, diabetes mellitus type 2, hypertension, peripheral vascular disease, and neuropathy. Currently she is on Lasix as well as lisinopril with hydrochlorothiazide. 08-02-2022 upon evaluation today patient presents for reevaluation the good news is she is actually doing  somewhat better in regard to the wound and the overall appearance and sinuses. The unfortunate thing is her infection really is not significantly improved we did have to switch out her antibiotic once we got that final result back and I switched her to Levaquin and away from the doxycycline. Unfortunately the doxycycline had been doing poorly for her. In fact she had had diarrhea from the time she started taking it on Friday and she is still having it when she shows up today for evaluation. Again I was not aware of this and obviously she does appear to be somewhat dehydrated as well based  on what I see. My concern which I discussed with the patient today is the possibility of a C. difficile infection. With that being said fact this started immediately upon taking the doxycycline makes me think that it was just the medicine and is not completely out of her system yet despite having taken the last dose Tuesday morning. Nonetheless with what we are seeing currently I want to be sure that reason I Minna contact her primary care provider and see if a would be willing to see her and test for C. difficile infection. 08-07-2022 upon evaluation today patient appears to be doing well currently in regard to her wounds which are actually measuring much better this is great news. Fortunately I do not see any signs of active infection locally or systemically at this time which is great as well. The good news is she was tested for a C. difficile infection and it was negative. I am very pleased and thankful for primary care provider for doing that so this means that she was just having a severe reaction to the doxycycline we have added that to her allergy list at this point. 08-14-2022 upon evaluation today patient appears to be doing well currently in regard to her dehydration she is actually significantly improved compared to last time I saw her last week. With that being said I do not see any evidence of active  infection systemically at this time which is great news. However locally she still does appear to have cellulitis in left lower extremity. I discussed with her today that I do believe she would benefit from going ahead and starting the [place]. Again we will concerned about the C. difficile infection of the diarrhea but that this turned out to be just an issue with a reaction to the doxycycline. My hope is that the Levaquin will not cause her any complication and will be able to treat the infection. I did review her arterial study as well which was dated on 07-31-2022. It showed that she had a ABI on the right of 1.30 and on the left of 1.27 with a TBI on the right of 1.12 and the left 1.21 this is a normal arterial study. Again on the patient's wound culture she actually did show evidence of Proteus, Morganella, and MRSA. Levaquin is a good option here across the board. 09/26/2022 Ms. Diane Poitra is a 82 year old female with a past medical history of type 2 diabetes currently controlled on oral agents, venous insufficiency/lymphedema, right breast cancer and chronic diastolic heart failure that presents to the clinic for a 1 month history of nonhealing wounds to the left lower extremity, right heel and left buttocks. She states that the buttocks wound and the right heel wound developed while she was in the hospital. She was admitted on 08/22/2022 for severe sepsis secondary to acute sigmoid and distal colonic diverticulitis. At that time it was noted she had a stage I decubitus ulcer to her bilateral buttocks. A wound to the heel was not mentioned. She currently denies systemic signs of infection. She came into clinic in a blue gown with no undergarments. She has not been dressing the wounds. She states she just recently obtained home health and they are coming out for the first time this week. She is seen in our clinic often for lower extremity wounds secondary to venous insufficiency. 11/22;  patient presents for follow-up. Son is present during the encounter. Per son it sounds like they are not doing any dressing changes.  She states she has home health but they have not come out. She is going to let us know which home health agency she has been approved for so we can send orders. Currently JANICIA, OMAR ([id number]) [id number] Page 4 of 14 she denies signs of infection. 12/13; patient presents for follow-up. Patient has home health and they are coming out once a week. It appears that there has not been any dressing changes except for with home health. Patient has a newly discovered wound to the left foot. There is exposed bone. There is slight erythema and increased warmth to the surrounding tissue. Patient is completely unaware of this wound. 12/20; patient presents for follow-up. She has been taking her oral antibiotics. She now has an eschar and wound to the right hip. She has been using Dakin's wet-to-dry dressings to the left foot wound and buttocks wound. She has been using Medihoney and silver alginate to the right heel wound. She currently denies systemic signs of infection. She is mainly bedbound or in a wheelchair. 1/10; patient presents for follow-up. Patient had her left second toenail removed by Dr. Vickki Muff, podiatry on 12/21. Unfortunately she did not feel well and she was admitted to the hospital for sepsis. Her right heel wound was thought to be infected and this was debrided in the OR by Dr. Vickki Muff. Culture and bone biopsy had no growth. She has been using Medihoney to all the wound beds except for the lefty buttocks wound for which she uses Dakin's wet-to-dry dressings. She has developed a pressure injury to the left heel now. This is despite having Prevalon boots. Although she is not wearing them today. She has completed 4 weeks of Augmentin and also a week of doxycycline for osteomyelitis of the left foot. Her left foot wound no longer  probes to bone. Currently she denies signs of infection. 1/24; patient presents for follow-up. She has been using Medihoney and Hydrofera Blue to the wound beds except for this buttocks wound she has been using Dakin's wet-to-dry dressings. She has developed 2 new wounds 1 to the left heel and the other to the right medial ankle. She claims she is wearing her Prevalon boots all the time although she does not have them on today. She currently denies signs of infection. 2/21; patient presents for follow-up. She was recently hospitalized on 12/19/2022 for sepsis secondary to Enterococcus bacillus bacteremia. She was treated with IV antibiotics and has completed her discharge oral antibiotics. She had a CT scanning of the abdomen/pelvis without acute abnormalities. Husband is present with patient today. They have been using Dakin's wet-to-dry dressings to the sacral wound and hip wound and Hydrofera Blue and Medihoney to the feet wounds. They are not changing the dressings daily. It is unclear how often they actually change the dressings. She has been wearing her Prevalon boots at night but not during the day. She currently denies systemic signs of infection. 3/20; patient presents for follow-up. She again was in the hospital for severe sepsis on 2/24; MRIs at that time showed findings suspicious for osteomyelitis to the right hip. She is currently on Augmentin to complete 4 weeks of this. She is following with ID. She has been using Dakin's wet-to-dry dressings to the sacrum and right hip however the solution is starting to cause discomfort. T the rest of the wound she has been using Hydrofera Blue and Medihoney. Patient o and son are not interested in doing palliative care or hospice. They state that they had discussions  while in the hospital. Electronic Signature(s) Signed: 01/30/2023 2:38:02 PM By: Kalman Shan DO Entered By: Kalman Shan on 01/30/2023  13:56:09 -------------------------------------------------------------------------------- Physical Exam Details Patient Name: Date of Service: [facility], [name] 01/30/2023 1:00 PM Medical Record Number: [id number] Patient Account Number: [id number] Date of Birth/Sex: Treating RN: 06/11/1941 (82 y.o. [name] Primary Care Provider: Tracie Harrier Other Clinician: Massie Kluver Referring Provider: Treating Provider/Extender: Arn Medal Weeks in Treatment: 18 Constitutional . Cardiovascular . Psychiatric . Notes T the right heel there is an open wound with granulation tissue. T the medial malleolus there is an open wound with granulation tissue and tightly adhered o o nonviable tissue. T the left buttocks there is a large open wound with granulation tissue. T the left foot first metatarsal there is an open wound with granulation o o tissue. T the left heel there is an open wound with eschar throughout. T the right hip there is a wound with non viable Tissue and granulation tissue, No signs o o of overt acute infection. Electronic Signature(s) Signed: 01/30/2023 2:38:02 PM By: Kalman Shan DO Entered By: Kalman Shan on 01/30/2023 13:57:35 Armas, [name] ([id number]) [id number] Page 5 of 14 -------------------------------------------------------------------------------- Physician Orders Details Patient Name: Date of Service: [facility], [name] 01/30/2023 1:00 PM Medical Record Number: [id number] Patient Account Number: [id number] Date of Birth/Sex: Treating RN: 18-Oct-1941 (82 y.o. [name] Primary Care Provider: Tracie Harrier Other Clinician: Massie Kluver Referring Provider: Treating Provider/Extender: Arn Medal Weeks in Treatment: 51 Verbal / Phone Orders: Yes Clinician: Cornell Barman Read Back and Verified: Yes Diagnosis Coding Follow-up Appointments Return  Appointment in 1 week. Bathing/ [employer] wounds with antibacterial soap and water. No tub bath. Anesthetic (Use 'Patient Medications' Section for Anesthetic Order Entry) Lidocaine applied to wound bed Off-Loading Other: - wear prevalon heel boots at all times Wound Treatment Wound #10 - Metatarsal head first Wound Laterality: Left Cleanser: Soap and Water 1 x Per Day/30 Days Discharge Instructions: Gently cleanse wound with antibacterial soap, rinse and pat dry prior to dressing wounds Cleanser: Vashe 5.8 (oz) (DME) (Dispense As Written) 1 x Per Day/30 Days Discharge Instructions: Use vashe 5.8 (oz) as directed Prim Dressing: Honey: Activon Honey Gel, 25 (g) Tube ary 1 x Per Day/30 Days Prim Dressing: Hydrofera Blue Ready Transfer Foam, 2.5x2.5 (in/in) (Dispense As Written) 1 x Per Day/30 Days ary Discharge Instructions: Apply Hydrofera Blue Ready to wound bed as directed Secondary Dressing: ABD Pad 5x9 (in/in) 1 x Per Day/30 Days Discharge Instructions: Cover with ABD pad Secured With: Medipore T - 60M Medipore H Soft Cloth Surgical T ape ape, 2x2 (in/yd) 1 x Per Day/30 Days Wound #11 - Ischial Tuberosity Wound Laterality: Right Cleanser: Soap and Water 1 x Per Day/30 Days Discharge Instructions: Gently cleanse wound with antibacterial soap, rinse and pat dry prior to dressing wounds Cleanser: Vashe 5.8 (oz) 1 x Per Day/30 Days Discharge Instructions: Use vashe 5.8 (oz) as directed Prim Dressing: Gauze 1 x Per Day/30 Days ary Discharge Instructions: Vashe wet to dry Secondary Dressing: (BORDER) Zetuvit Plus SILICONE BORDER Dressing 5x5 (in/in) (DME) (Dispense As Written) 1 x Per Day/30 Days Discharge Instructions: Please do not put silicone bordered dressings under wraps. Use non-bordered dressing only. Wound #12 - Ankle Wound Laterality: Right, Medial Cleanser: Soap and Water 1 x Per Day/30 Days Discharge Instructions: Gently cleanse wound with antibacterial soap,  rinse and pat dry prior to dressing wounds Cleanser: Vashe 5.8 (oz)  1 x Per Day/30 Days Discharge Instructions: Use vashe 5.8 (oz) as directed Prim Dressing: Honey: Activon Honey Gel, 25 (g) Tube ary 1 x Per Day/30 Days Lyford, Helena L ([id number]) [id number] Page 6 of 14 Secondary Dressing: Hydrofera Blue Ready Transfer Foam, 2.5x2.5 (in/in) 1 x Per Day/30 Days Discharge Instructions: Apply to wound bed over non-stick dressing. Secondary Dressing: (BORDER) Zetuvit Plus SILICONE BORDER Dressing 5x5 (in/in) (Generic) 1 x Per Day/30 Days Discharge Instructions: Please do not put silicone bordered dressings under wraps. Use non-bordered dressing only. Secured With: Medipore T - 59M Medipore H Soft Cloth Surgical T ape ape, 2x2 (in/yd) (Dispense As Written) 1 x Per Day/30 Days Wound #13 - Calcaneus Wound Laterality: Left Cleanser: Soap and Water 1 x Per Day/30 Days Discharge Instructions: Gently cleanse wound with antibacterial soap, rinse and pat dry prior to dressing wounds Cleanser: Vashe 5.8 (oz) 1 x Per Day/30 Days Discharge Instructions: Use vashe 5.8 (oz) as directed Prim Dressing: Honey: Activon Honey Gel, 25 (g) Tube ary 1 x Per Day/30 Days Secondary Dressing: Hydrofera Blue Ready Transfer Foam, 2.5x2.5 (in/in) 1 x Per Day/30 Days Discharge Instructions: Apply to wound bed over non-stick dressing. Secured With: Medipore T - 59M Medipore H Soft Cloth Surgical T ape ape, 2x2 (in/yd) (Dispense As Written) 1 x Per Day/30 Days Secured With: [employer] Sterile or Non-Sterile 6-ply 4.5x4 (yd/yd) (DME) (Dispense As Written) 1 x Per Day/30 Days Discharge Instructions: Apply Kerlix as directed Wound #6 - Calcaneus Wound Laterality: Right Cleanser: [place] Ancillary Kit - 15 Day Supply (Generic) 3 x Per Week/30 Days Discharge Instructions: Use supplies as instructed; Kit contains: (15) Saline Bullets; (15) 3x3 Gauze; 15 pr Gloves Cleanser: Soap and Water 3 x Per  Week/30 Days Discharge Instructions: Gently cleanse wound with antibacterial soap, rinse and pat dry prior to dressing wounds Cleanser: Vashe 5.8 (oz) 3 x Per Week/30 Days Discharge Instructions: Use vashe 5.8 (oz) as directed Topical: Activon Honey Gel, 25 (g) Tube (Dispense As Written) 3 x Per Week/30 Days Prim Dressing: Hydrofera Blue Ready Transfer Foam, 2.5x2.5 (in/in) 3 x Per Week/30 Days ary Discharge Instructions: Apply Hydrofera Blue Ready to wound bed as directed Secondary Dressing: ABD Pad 5x9 (in/in) 3 x Per Week/30 Days Discharge Instructions: Cover with ABD pad Secondary Dressing: Kerlix 4.5 x 4.1 (in/yd) (DME) (Dispense As Written) 3 x Per Week/30 Days Discharge Instructions: Apply Kerlix 4.5 x 4.1 (in/yd) as instructed Secured With: Medipore T - 59M Medipore H Soft Cloth Surgical T ape ape, 2x2 (in/yd) 3 x Per Week/30 Days Wound #8 - Sacrum Wound Laterality: Midline Cleanser: Soap and Water 1 x Per Day/30 Days Discharge Instructions: Gently cleanse wound with antibacterial soap, rinse and pat dry prior to dressing wounds Cleanser: Vashe 5.8 (oz) 1 x Per Day/30 Days Discharge Instructions: Use vashe 5.8 (oz) as directed Prim Dressing: Gauze 1 x Per Day/30 Days ary Discharge Instructions: moistened with Vashe Solution for wet to dry dressing Secondary Dressing: (BORDER) Zetuvit Plus SILICONE BORDER Dressing 5x5 (in/in) (DME) (Dispense As Written) 1 x Per Day/30 Days Discharge Instructions: Please do not put silicone bordered dressings under wraps. Use non-bordered dressing only. Electronic Signature(s) Signed: 01/31/2023 11:20:18 AM By: Massie Kluver Signed: 01/31/2023 11:53:51 AM By: Kalman Shan DO Previous Signature: 01/30/2023 2:38:02 PM Version By: Kalman Shan DO Previous Signature: 01/30/2023 5:18:06 PM Version By: Massie Kluver Entered By: Massie Kluver on 01/31/2023 11:14:14 Hetz, [name] ([id number]) [id number] Page 7 of  14 -------------------------------------------------------------------------------- Problem List Details Patient Name: Date  of Service: [facility], [name] 01/30/2023 1:00 PM Medical Record Number: [id number] Patient Account Number: [id number] Date of Birth/Sex: Treating RN: [birth year] (82 y.o. [name] Primary Care Provider: Tracie Harrier Other Clinician: Massie Kluver Referring Provider: Treating Provider/Extender: Arn Medal Weeks in Treatment: 89 Active Problems ICD-10 Encounter Code Description Active Date MDM Diagnosis [phone] Non-pressure chronic ulcer of other part of left lower leg with fat layer 09/26/2022 No Yes exposed I87.312 Chronic venous hypertension (idiopathic) with ulcer of left lower extremity 09/26/2022 No Yes I89.0 Lymphedema, not elsewhere classified 09/26/2022 No Yes E11.622 Type 2 diabetes mellitus with other skin ulcer 09/26/2022 No Yes [id number] Chronic diastolic (congestive) heart failure 09/26/2022 No Yes L89.320 Pressure ulcer of left buttock, unstageable 09/26/2022 No Yes L89.610 Pressure ulcer of right heel, unstageable 09/26/2022 No Yes S91.302A Unspecified open wound, left foot, initial encounter 10/24/2022 No Yes M86.172 Other acute osteomyelitis, left ankle and foot 10/24/2022 No Yes L89.210 Pressure ulcer of right hip, unstageable 11/21/2022 No Yes L89.513 Pressure ulcer of right ankle, stage 3 12/05/2022 No Yes L89.620 Pressure ulcer of left heel, unstageable 12/05/2022 No Yes Purkey, Cyrena L ([id number]) [id number] Page 8 of 14 Inactive Problems Resolved Problems Electronic Signature(s) Signed: 01/30/2023 2:38:02 PM By: Kalman Shan DO Entered By: Kalman Shan on 01/30/2023 13:53:39 -------------------------------------------------------------------------------- Progress Note Details Patient Name: Date of Service: [facility], [name] 01/30/2023 1:00 PM Medical Record  Number: [id number] Patient Account Number: [id number] Date of Birth/Sex: Treating RN: August 26, 1941 (82 y.o. [name] Primary Care Provider: Tracie Harrier Other Clinician: Massie Kluver Referring Provider: Treating Provider/Extender: Arn Medal Weeks in Treatment: 82 Subjective Chief Complaint Information obtained from Patient 09/26/2022; left lower extremity wound, right heel wound and left buttocks wound History of Present Illness (HPI) 82 year old patient who comes with a referral for bilateral lower extremity edema and a lower extremity ulceration and has been sent by her PCP Dr. Placido Sou. I understand the patient was recently put on amoxicillin and doxycycline but could not tolerate the amoxicillin. doxycycline course was completed. a BNP and EKG was supposed to be normal and the patient did not have any dyspnea. the patient has been on a diuretic. The patient was also prescribed a pair of elastic compression stockings of the 20-30 mmHg pressure variety. x-ray of the right ankle was done on 09/20/2015 and showed posttraumatic and postsurgical changes of the right ankle with secondary degenerative changes of the tibiotalar joint and to a lesser degree the subtalar joint. No definite acute bony abnormalities are noted. Past medical history significant for diabetes mellitus, hypertension, hyperlipidemia, right breast cancer treated with a mastectomy in 2014. She has never smoked. 10/24/2015 -- she had delayed her vascular test because of her husband surgery but she is now ready to get him taken care of. He is also unable to use compression stockings and hence we will need to order her Juzo wraps. 10/31/2015-- was seen by Dr. Lucky Cowboy on 10/28/2015. She had a left lower extremity arterial duplex done at his office a couple of years ago and that was essentially normal. Today they performed a venous duplex which revealed no evidence of deep vein thrombosis,  superficial thrombophlebitis, no venous reflex was seen on the right and a minimal amount of reflux was seen on the left great saphenous vein but no significant reflux was seen. Impression was that there was a component of lymphedema present from a previous surgery and he would recommend compression stockings and leg elevation. Readmission:  07-24-2022 upon evaluation today patient presents for initial inspection here in our clinic concerning issues that she has been having with her legs this is actually been going on for several years according to what her family member with her today tells me as well as what the patient reiterates as well. She is currently most recently been seeing Dr. Vickki Muff and subsequently he had her in Carmen boots. However 2 weeks ago he referred her to [place] and then subsequently took her out of the Unna boot wraps at that point. At this time the left leg looks to be worse in the right leg currently. She is on Lasix and lisinopril with hydrochlorothiazide she has high blood pressure she also has issues currently with lower extremity swelling and edema which has been an ongoing issue for her as well. Patient does have a history of chronic venous hypertension, lymphedema, diabetes mellitus type 2, hypertension, peripheral vascular disease, and neuropathy. Currently she is on Lasix as well as lisinopril with hydrochlorothiazide. 08-02-2022 upon evaluation today patient presents for reevaluation the good news is she is actually doing somewhat better in regard to the wound and the overall appearance and sinuses. The unfortunate thing is her infection really is not significantly improved we did have to switch out her antibiotic once we got that final result back and I switched her to Levaquin and away from the doxycycline. Unfortunately the doxycycline had been doing poorly for her. In fact she had had diarrhea from the time she started taking it on Friday and she is still having it when  she shows up today for evaluation. Again I was not aware of this and obviously she does appear to be somewhat dehydrated as well based on what I see. My concern which I discussed with the patient today is the possibility of a C. difficile infection. With that being said fact this started immediately upon taking the doxycycline makes me think that it was just the medicine and is not completely out of her system yet despite having taken the last dose Tuesday morning. Nonetheless with what we are seeing currently I want to be sure that reason I Minna contact her primary care provider and see if a would be willing to see her and test for C. difficile infection. 08-07-2022 upon evaluation today patient appears to be doing well currently in regard to her wounds which are actually measuring much better this is great news. Fortunately I do not see any signs of active infection locally or systemically at this time which is great as well. The good news is she was tested for a C. difficile infection and it was negative. I am very pleased and thankful for primary care provider for doing that so this means that she was just having a severe reaction to the doxycycline we have added that to her allergy list at this point. 08-14-2022 upon evaluation today patient appears to be doing well currently in regard to her dehydration she is actually significantly improved compared to last [facility] ([id number]) [id number] Page 9 of 14 time I saw her last week. With that being said I do not see any evidence of active infection systemically at this time which is great news. However locally she still does appear to have cellulitis in left lower extremity. I discussed with her today that I do believe she would benefit from going ahead and starting the Avery. Again we will concerned about the C. difficile infection of the diarrhea but that this turned  out to be just an issue with a reaction to  the doxycycline. My hope is that the Levaquin will not cause her any complication and will be able to treat the infection. I did review her arterial study as well which was dated on 07-31-2022. It showed that she had a ABI on the right of 1.30 and on the left of 1.27 with a TBI on the right of 1.12 and the left 1.21 this is a normal arterial study. Again on the patient's wound culture she actually did show evidence of Proteus, Morganella, and MRSA. Levaquin is a good option here across the board. 09/26/2022 Ms. Diane Pruess is a 82 year old female with a past medical history of type 2 diabetes currently controlled on oral agents, venous insufficiency/lymphedema, right breast cancer and chronic diastolic heart failure that presents to the clinic for a 1 month history of nonhealing wounds to the left lower extremity, right heel and left buttocks. She states that the buttocks wound and the right heel wound developed while she was in the hospital. She was admitted on 08/22/2022 for severe sepsis secondary to acute sigmoid and distal colonic diverticulitis. At that time it was noted she had a stage I decubitus ulcer to her bilateral buttocks. A wound to the heel was not mentioned. She currently denies systemic signs of infection. She came into clinic in a blue gown with no undergarments. She has not been dressing the wounds. She states she just recently obtained home health and they are coming out for the first time this week. She is seen in our clinic often for lower extremity wounds secondary to venous insufficiency. 11/22; patient presents for follow-up. Son is present during the encounter. Per son it sounds like they are not doing any dressing changes. She states she has home health but they have not come out. She is going to let us know which home health agency she has been approved for so we can send orders. Currently she denies signs of infection. 12/13; patient presents for follow-up. Patient  has home health and they are coming out once a week. It appears that there has not been any dressing changes except for with home health. Patient has a newly discovered wound to the left foot. There is exposed bone. There is slight erythema and increased warmth to the surrounding tissue. Patient is completely unaware of this wound. 12/20; patient presents for follow-up. She has been taking her oral antibiotics. She now has an eschar and wound to the right hip. She has been using Dakin's wet-to-dry dressings to the left foot wound and buttocks wound. She has been using Medihoney and silver alginate to the right heel wound. She currently denies systemic signs of infection. She is mainly bedbound or in a wheelchair. 1/10; patient presents for follow-up. Patient had her left second toenail removed by Dr. Vickki Muff, podiatry on 12/21. Unfortunately she did not feel well and she was admitted to the hospital for sepsis. Her right heel wound was thought to be infected and this was debrided in the OR by Dr. Vickki Muff. Culture and bone biopsy had no growth. She has been using Medihoney to all the wound beds except for the lefty buttocks wound for which she uses Dakin's wet-to-dry dressings. She has developed a pressure injury to the left heel now. This is despite having Prevalon boots. Although she is not wearing them today. She has completed 4 weeks of Augmentin and also a week of doxycycline for osteomyelitis of the left foot. Her left  foot wound no longer probes to bone. Currently she denies signs of infection. 1/24; patient presents for follow-up. She has been using Medihoney and Hydrofera Blue to the wound beds except for this buttocks wound she has been using Dakin's wet-to-dry dressings. She has developed 2 new wounds 1 to the left heel and the other to the right medial ankle. She claims she is wearing her Prevalon boots all the time although she does not have them on today. She currently denies signs of  infection. 2/21; patient presents for follow-up. She was recently hospitalized on 12/19/2022 for sepsis secondary to Enterococcus bacillus bacteremia. She was treated with IV antibiotics and has completed her discharge oral antibiotics. She had a CT scanning of the abdomen/pelvis without acute abnormalities. Husband is present with patient today. They have been using Dakin's wet-to-dry dressings to the sacral wound and hip wound and Hydrofera Blue and Medihoney to the feet wounds. They are not changing the dressings daily. It is unclear how often they actually change the dressings. She has been wearing her Prevalon boots at night but not during the day. She currently denies systemic signs of infection. 3/20; patient presents for follow-up. She again was in the hospital for severe sepsis on 2/24; MRIs at that time showed findings suspicious for osteomyelitis to the right hip. She is currently on Augmentin to complete 4 weeks of this. She is following with ID. She has been using Dakin's wet-to-dry dressings to the sacrum and right hip however the solution is starting to cause discomfort. T the rest of the wound she has been using Hydrofera Blue and Medihoney. Patient o and son are not interested in doing palliative care or hospice. They state that they had discussions while in the hospital. Objective Constitutional Vitals Time Taken: 1:09 PM, Weight: 116 lbs, Temperature: 98.2 F, Pulse: 77 bpm, Respiratory Rate: 16 breaths/min, Blood Pressure: 84/51 mmHg. General Notes: T the right heel there is an open wound with granulation tissue. T the medial malleolus there is an open wound with granulation tissue and o o tightly adhered nonviable tissue. T the left buttocks there is a large open wound with granulation tissue. T the left foot first metatarsal there is an open wound o o with granulation tissue. T the left heel there is an open wound with eschar throughout. T the right hip there is a wound with  non viable Tissue and granulation o o tissue, No signs of overt acute infection. Integumentary (Hair, Skin) Wound #10 status is Open. Original cause of wound was Pressure Injury. The date acquired was: 10/23/2022. The wound has been in treatment 14 weeks. The wound is located on the Left Metatarsal head first. The wound measures 0.4cm length x 0.7cm width x 0.1cm depth; 0.22cm^2 area and 0.022cm^3 volume. There is bone and Fat Layer (Subcutaneous Tissue) exposed. There is a large amount of serosanguineous drainage noted. The wound margin is flat and intact. There is no granulation within the wound bed. There is a medium (34-66%) amount of necrotic tissue within the wound bed including Necrosis of Bone. Wound #11 status is Open. Original cause of wound was Pressure Injury. The date acquired was: 10/24/2022. The wound has been in treatment 13 weeks. The wound is located on the Right Ischial Tuberosity. The wound measures 4.4cm length x 6cm width x 0.4cm depth; 20.735cm^2 area and 8.294cm^3 volume. There is Fat Layer (Subcutaneous Tissue) exposed. There is undermining starting at 12:00 and ending at 12:00 with a maximum distance of 3cm. There is  a none present amount of drainage noted. The wound margin is flat and intact. There is a medium (34-66%) amount of necrotic tissue within the wound bed including Eschar. Wound #12 status is Open. Original cause of wound was Pressure Injury. The date acquired was: 12/05/2022. The wound has been in treatment 8 weeks. The wound is located on the Right,Medial Ankle. The wound measures 2.5cm length x 3cm width x 0.1cm depth; 5.89cm^2 area and 0.589cm^3 volume. There is Fat Layer (Subcutaneous Tissue) exposed. There is a medium amount of serosanguineous drainage noted. The wound margin is flat and intact. Wound #13 status is Open. Original cause of wound was Pressure Injury. The date acquired was: 11/28/2022. The wound has been in treatment 8 weeks. The wound is  located on the Left Calcaneus. The wound measures 1.7cm length x 1.7cm width x 0.1cm depth; 2.27cm^2 area and 0.227cm^3 volume. There is Fat Layer (Subcutaneous Tissue) exposed. There is a medium amount of serosanguineous drainage noted. The wound margin is flat and intact. There is no granulation within the wound bed. There is a large (67-100%) amount of necrotic tissue within the wound bed including Eschar. BASSHEVA, BRASILE ([id number]) [id number] Page 10 of 14 Wound #6 status is Open. Original cause of wound was Pressure Injury. The date acquired was: 07/23/2022. The wound has been in treatment 18 weeks. The wound is located on the Right Calcaneus. The wound measures 1cm length x 2.5cm width x 0.1cm depth; 1.963cm^2 area and 0.196cm^3 volume. There is Fat Layer (Subcutaneous Tissue) exposed. There is a medium amount of serosanguineous drainage noted. Foul odor after cleansing was noted. There is no granulation within the wound bed. There is a large (67-100%) amount of necrotic tissue within the wound bed including Eschar. Wound #8 status is Open. Original cause of wound was Pressure Injury. The date acquired was: 07/23/2022. The wound has been in treatment 18 weeks. The wound is located on the Midline Sacrum. The wound measures 1cm length x 0.8cm width x 0.3cm depth; 0.628cm^2 area and 0.188cm^3 volume. There is Fat Layer (Subcutaneous Tissue) exposed. There is undermining starting at 12:00 and ending at 5:00 with a maximum distance of 0.6cm. There is a medium amount of serosanguineous drainage noted. Foul odor after cleansing was noted. There is no granulation within the wound bed. There is a large (67-100%) amount of necrotic tissue within the wound bed including Adherent Slough. Assessment Active Problems ICD-10 Non-pressure chronic ulcer of other part of left lower leg with fat layer exposed Chronic venous hypertension (idiopathic) with ulcer of left lower  extremity Lymphedema, not elsewhere classified Type 2 diabetes mellitus with other skin ulcer Chronic diastolic (congestive) heart failure Pressure ulcer of left buttock, unstageable Pressure ulcer of right heel, unstageable Unspecified open wound, left foot, initial encounter Other acute osteomyelitis, left ankle and foot Pressure ulcer of right hip, unstageable Pressure ulcer of right ankle, stage 3 Pressure ulcer of left heel, unstageable There was some forward progress today with more granulation tissue present to the wound sites. I debrided nonviable tissue. Since Dakins is causing her discomfort I recommended switching to Vashe wet-to-dry dressings to the right hip wound and sacral wound. T the remaining wounds she can use Medihoney o and Hydrofera Blue. She is on Augmentin to complete 4 weeks. Follow-up in 1 month. Continue aggressive offloading. Overall poor prognosis due to lack of mobility and nutrition status. Procedures Wound #11 Pre-procedure diagnosis of Wound #11 is a Pressure Ulcer located on the Right Ischial Tuberosity . There  was a Selective/Open Wound Non-Viable Tissue Debridement with a total area of 16 sq cm performed by Kalman Shan, MD. With the following instrument(s): Forceps, and Scissors to remove Non-Viable tissue/material. Material removed includes Eschar. A time out was conducted at 13:51, prior to the start of the procedure. A Minimum amount of bleeding was controlled with Pressure. The procedure was tolerated well. Post Debridement Measurements: 4.4cm length x 6cm width x 0.4cm depth; 8.294cm^3 volume. Post debridement Stage noted as Unstageable/Unclassified. Character of Wound/Ulcer Post Debridement is stable. Post procedure Diagnosis Wound #11: Same as Pre-Procedure Wound #13 Pre-procedure diagnosis of Wound #13 is a Pressure Ulcer located on the Left Calcaneus . There was a Excisional Skin/Subcutaneous Tissue Debridement with a total area of 2.89 sq  cm performed by Kalman Shan, MD. With the following instrument(s): Curette to remove Viable and Non-Viable tissue/material. Material removed includes Eschar, Subcutaneous Tissue, and Slough. A time out was conducted at 13:48, prior to the start of the procedure. A Minimum amount of bleeding was controlled with Pressure. The procedure was tolerated well. Post Debridement Measurements: 1.7cm length x 1.7cm width x 0.2cm depth; 0.454cm^3 volume. Post debridement Stage noted as Category/Stage II. Character of Wound/Ulcer Post Debridement is stable. Post procedure Diagnosis Wound #13: Same as Pre-Procedure Plan Follow-up Appointments: Return Appointment in 1 week. Bathing/ Shower/ Hygiene: Wash wounds with antibacterial soap and water. No tub bath. Anesthetic (Use 'Patient Medications' Section for Anesthetic Order Entry): Lidocaine applied to wound bed Off-Loading: Other: - wear prevalon heel boots at all times WOUND #10: - Metatarsal head first Wound Laterality: Left Cleanser: Soap and Water 3 x Per Week/30 Days Discharge Instructions: Gently cleanse wound with antibacterial soap, rinse and pat dry prior to dressing wounds Cleanser: Vashe 5.8 (oz) 3 x Per Week/30 Days Discharge Instructions: Use vashe 5.8 (oz) as directed Prim Dressing: Honey: Activon Honey Gel, 25 (g) Tube 3 x Per Week/30 Days ary Prim Dressing: Hydrofera Blue Ready Transfer Foam, 2.5x2.5 (in/in) (Dispense As Written) 3 x Per Week/30 Days ary Heminger, Kairah L ([id number]) [id number] Page 11 of 14 Discharge Instructions: Apply Hydrofera Blue Ready to wound bed as directed Secondary Dressing: ABD Pad 5x9 (in/in) 3 x Per Week/30 Days Discharge Instructions: Cover with ABD pad Secured With: Medipore T - 46M Medipore H Soft Cloth Surgical T ape ape, 2x2 (in/yd) 3 x Per Week/30 Days WOUND #11: - Ischial Tuberosity Wound Laterality: Right Cleanser: Soap and Water 1 x Per Day/30 Days Discharge  Instructions: Gently cleanse wound with antibacterial soap, rinse and pat dry prior to dressing wounds Cleanser: Vashe 5.8 (oz) 1 x Per Day/30 Days Discharge Instructions: Use vashe 5.8 (oz) as directed Prim Dressing: Gauze 1 x Per Day/30 Days ary Discharge Instructions: Vashe wet to dry Secondary Dressing: (BORDER) Zetuvit Plus SILICONE BORDER Dressing 5x5 (in/in) (Generic) 1 x Per Day/30 Days Discharge Instructions: Please do not put silicone bordered dressings under wraps. Use non-bordered dressing only. Secured With: Medipore T - 46M Medipore H Soft Cloth Surgical T ape ape, 2x2 (in/yd) (Dispense As Written) 1 x Per Day/30 Days WOUND #12: - Ankle Wound Laterality: Right, Medial Cleanser: Soap and Water 1 x Per Day/30 Days Discharge Instructions: Gently cleanse wound with antibacterial soap, rinse and pat dry prior to dressing wounds Cleanser: Vashe 5.8 (oz) 1 x Per Day/30 Days Discharge Instructions: Use vashe 5.8 (oz) as directed Prim Dressing: Honey: Activon Honey Gel, 25 (g) Tube 1 x Per Day/30 Days ary Secondary Dressing: Hydrofera Blue Ready Transfer Foam, 2.5x2.5 (in/in) 1  x Per Day/30 Days Discharge Instructions: Apply to wound bed over non-stick dressing. Secondary Dressing: (BORDER) Zetuvit Plus SILICONE BORDER Dressing 5x5 (in/in) (Generic) 1 x Per Day/30 Days Discharge Instructions: Please do not put silicone bordered dressings under wraps. Use non-bordered dressing only. Secured With: Medipore T - 77M Medipore H Soft Cloth Surgical T ape ape, 2x2 (in/yd) (Dispense As Written) 1 x Per Day/30 Days WOUND #13: - Calcaneus Wound Laterality: Left Cleanser: Soap and Water 1 x Per Day/30 Days Discharge Instructions: Gently cleanse wound with antibacterial soap, rinse and pat dry prior to dressing wounds Cleanser: Vashe 5.8 (oz) 1 x Per Day/30 Days Discharge Instructions: Use vashe 5.8 (oz) as directed Prim Dressing: Honey: Activon Honey Gel, 25 (g) Tube 1 x Per Day/30  Days ary Secondary Dressing: Hydrofera Blue Ready Transfer Foam, 2.5x2.5 (in/in) 1 x Per Day/30 Days Discharge Instructions: Apply to wound bed over non-stick dressing. Secondary Dressing: (BORDER) Zetuvit Plus SILICONE BORDER Dressing 5x5 (in/in) (Generic) 1 x Per Day/30 Days Discharge Instructions: Please do not put silicone bordered dressings under wraps. Use non-bordered dressing only. Secured With: Medipore T - 77M Medipore H Soft Cloth Surgical T ape ape, 2x2 (in/yd) (Dispense As Written) 1 x Per Day/30 Days WOUND #6: - Calcaneus Wound Laterality: Right Cleanser: Byram Ancillary Kit - 15 Day Supply (Generic) 3 x Per Week/30 Days Discharge Instructions: Use supplies as instructed; Kit contains: (15) Saline Bullets; (15) 3x3 Gauze; 15 pr Gloves Cleanser: Soap and Water 3 x Per Week/30 Days Discharge Instructions: Gently cleanse wound with antibacterial soap, rinse and pat dry prior to dressing wounds Cleanser: Vashe 5.8 (oz) 3 x Per Week/30 Days Discharge Instructions: Use vashe 5.8 (oz) as directed Topical: Activon Honey Gel, 25 (g) Tube (Dispense As Written) 3 x Per Week/30 Days Prim Dressing: Hydrofera Blue Ready Transfer Foam, 2.5x2.5 (in/in) 3 x Per Week/30 Days ary Discharge Instructions: Apply Hydrofera Blue Ready to wound bed as directed Secondary Dressing: ABD Pad 5x9 (in/in) 3 x Per Week/30 Days Discharge Instructions: Cover with ABD pad Secondary Dressing: Kerlix 4.5 x 4.1 (in/yd) 3 x Per Week/30 Days Discharge Instructions: Apply Kerlix 4.5 x 4.1 (in/yd) as instructed Secured With: Medipore T - 77M Medipore H Soft Cloth Surgical T ape ape, 2x2 (in/yd) 3 x Per Week/30 Days WOUND #8: - Sacrum Wound Laterality: Midline Cleanser: Soap and Water 1 x Per Day/30 Days Discharge Instructions: Gently cleanse wound with antibacterial soap, rinse and pat dry prior to dressing wounds Cleanser: Vashe 5.8 (oz) 1 x Per Day/30 Days Discharge Instructions: Use vashe 5.8 (oz) as  directed Prim Dressing: Gauze 1 x Per Day/30 Days ary Discharge Instructions: moistened with Vashe Solution for wet to dry dressing Secondary Dressing: ABD Pad 5x9 (in/in) 1 x Per Day/30 Days Discharge Instructions: Cover with ABD pad Secured With: Medipore T - 77M Medipore H Soft Cloth Surgical T ape ape, 2x2 (in/yd) 1 x Per Day/30 Days 1. In office sharp debridement 2. Vashe wet-to-dry dressings 3. Medihoney and Hydrofera Blue 4. Aggressive offloading 5. Follow-up in 4 weeks Electronic Signature(s) Signed: 01/30/2023 2:38:02 PM By: Kalman Shan DO Entered By: Kalman Shan on 01/30/2023 14:00:12 [place], [name] ([id number]) [id number] Page 12 of 14 -------------------------------------------------------------------------------- ROS/PFSH Details Patient Name: Date of Service: [facility], [name] 01/30/2023 1:00 PM Medical Record Number: [id number] Patient Account Number: [id number] Date of Birth/Sex: Treating RN: 08-19-1941 (82 y.o. [name] Primary Care Provider: Tracie Harrier Other Clinician: Massie Kluver Referring Provider: Treating Provider/Extender: Arn Medal  Weeks in Treatment: 18 Information Obtained From Patient Eyes Medical History: Positive for: Cataracts - removed 2 years ago Negative for: Glaucoma; Optic Neuritis Ear/Nose/Mouth/Throat Medical History: Negative for: Chronic sinus problems/congestion; Middle ear problems Hematologic/Lymphatic Medical History: Positive for: Lymphedema Negative for: Anemia; Hemophilia; Human Immunodeficiency Virus; Sickle Cell Disease Respiratory Medical History: Negative for: Aspiration; Asthma; Chronic Obstructive Pulmonary Disease (COPD); Pneumothorax; Sleep Apnea; Tuberculosis Cardiovascular Medical History: Positive for: Hypertension; Peripheral Arterial Disease; Peripheral Venous Disease Gastrointestinal Medical History: Negative for: Cirrhosis ;  Colitis; Crohns; Hepatitis A; Hepatitis B; Hepatitis C Endocrine Medical History: Positive for: Type II Diabetes Time with diabetes: 8years Treated with: Oral agents Blood sugar tested every day: No Genitourinary Medical History: Past Medical History Notes: Hx of Kidney stones, hysterectomy Musculoskeletal Medical History: Positive for: Osteoarthritis Past Medical History Notes: Rotator cuff disorder Mogg, Niyonna L ([id number]) [id number] Page 13 of 14 Neurologic Medical History: Positive for: Neuropathy Negative for: Dementia; Quadriplegia; Paraplegia; Seizure Disorder Oncologic Medical History: Past Medical History Notes: Breast Ca Psychiatric Medical History: Negative for: Anorexia/bulimia; Confinement Anxiety HBO Extended History Items Eyes: Cataracts Immunizations Pneumococcal Vaccine: Received Pneumococcal Vaccination: Yes Received Pneumococcal Vaccination On or After 60th Birthday: Yes Implantable Devices None Family and Social History Cancer: No; Diabetes: No; Heart Disease: Yes - Mother,Father; Hereditary Spherocytosis: No; Hypertension: Yes - Mother,Father; Kidney Disease: No; Lung Disease: No; Seizures: No; Stroke: Yes - Mother,Father; Thyroid Problems: No; Tuberculosis: No; Never smoker; Marital Status - Married; Alcohol Use: Never; Drug Use: No History; Caffeine Use: Daily; Financial Concerns: No; Food, Clothing or Shelter Needs: No; Support System Lacking: No; Transportation Concerns: No Electronic Signature(s) Signed: 01/30/2023 2:38:02 PM By: Kalman Shan DO Signed: 01/30/2023 5:15:10 PM By: Gretta Cool, BSN, RN, CWS, Kim RN, BSN Entered By: Kalman Shan on 01/30/2023 14:01:28 -------------------------------------------------------------------------------- [place] Details Patient Name: Date of Service: [facility], [name] 01/30/2023 Medical Record Number: [id number] Patient Account Number: [id number] Date of  Birth/Sex: Treating RN: 1941/06/20 (82 y.o. Charolette Forward, Kim Primary Care Provider: Tracie Harrier Other Clinician: Massie Kluver Referring Provider: Treating Provider/Extender: Arn Medal Weeks in Treatment: 18 Diagnosis Coding ICD-10 Codes Code Description [phone] Non-pressure chronic ulcer of other part of left lower leg with fat layer exposed I87.312 Chronic venous hypertension (idiopathic) with ulcer of left lower extremity I89.0 Lymphedema, not elsewhere classified E11.622 Type 2 diabetes mellitus with other skin ulcer [id number] Chronic diastolic (congestive) heart failure L89.320 Pressure ulcer of left buttock, unstageable Kaigler, Delicia L ([id number]) [id number] Page 14 of 14 L89.610 Pressure ulcer of right heel, unstageable S91.302A Unspecified open wound, left foot, initial encounter M86.172 Other acute osteomyelitis, left ankle and foot L89.210 Pressure ulcer of right hip, unstageable L89.513 Pressure ulcer of right ankle, stage 3 L89.620 Pressure ulcer of left heel, unstageable Facility Procedures : CPT4 Code: [id number] Description: [id number] - DEB SUBQ TISSUE 20 SQ CM/< ICD-10 Diagnosis Description L89.620 Pressure ulcer of left heel, unstageable Modifier: Quantity: 1 : CPT4 Code: [id number] Description: [id number] - DEBRIDE WOUND 1ST 20 SQ CM OR < ICD-10 Diagnosis Description L89.210 Pressure ulcer of right hip, unstageable Modifier: Quantity: 1 Physician Procedures : CPT4 Code Description Modifier [id number] - WC PHYS LEVEL 3 - EST PT ICD-10 Diagnosis Description L97.822 Non-pressure chronic ulcer of other part of left lower leg with fat layer exposed E11.622 Type 2 diabetes mellitus with other skin ulcer  L89.513 Pressure ulcer of right ankle, stage 3 S91.302A Unspecified open wound, left foot, initial encounter Quantity: 1 : [id number] - WC PHYS SUBQ TISS 20  SQ CM ICD-10 Diagnosis Description L89.620 Pressure ulcer  of left heel, unstageable Quantity: 1 : [id number] - WC PHYS DEBR WO ANESTH 20 SQ CM ICD-10 Diagnosis Description L89.210 Pressure ulcer of right hip, unstageable Quantity: 1 Electronic Signature(s) Signed: 01/30/2023 2:38:02 PM By: Kalman Shan DO Entered By: Kalman Shan on 01/30/2023 14:01:07

## 2023-02-06 ENCOUNTER — Other Ambulatory Visit: Payer: PPO

## 2023-02-06 DIAGNOSIS — Z515 Encounter for palliative care: Secondary | ICD-10-CM

## 2023-02-06 NOTE — Progress Notes (Signed)
PATIENT NAME: [name] DOB: 07-20-1941 MRN: [id number]  PRIMARY CARE PROVIDER: Tracie Harrier, MD  RESPONSIBLE PARTY:  Acct ID - Guarantor Home Phone Work Phone Relationship Acct Type  [id number] KARENE, SCHNITTKER* [phone]  Self P/F     [address] [place], [place] [address]    Home visit scheduled for today.  Arrived at patient's home and no answer at the door.  Phone call made to spouse's cell number and message left.  Also called the home phone but this number is disconnected.  Phone call to son but no answer and unable to leave a message.  Will attempt to reach patient again at a later time.     Lorenza Burton, RN

## 2023-02-12 DIAGNOSIS — L89629 Pressure ulcer of left heel, unspecified stage: Secondary | ICD-10-CM | POA: Diagnosis not present

## 2023-02-12 DIAGNOSIS — I10 Essential (primary) hypertension: Secondary | ICD-10-CM | POA: Diagnosis not present

## 2023-02-12 DIAGNOSIS — E8809 Other disorders of plasma-protein metabolism, not elsewhere classified: Secondary | ICD-10-CM | POA: Diagnosis not present

## 2023-02-12 DIAGNOSIS — L89150 Pressure ulcer of sacral region, unstageable: Secondary | ICD-10-CM | POA: Diagnosis not present

## 2023-02-12 DIAGNOSIS — R531 Weakness: Secondary | ICD-10-CM | POA: Diagnosis not present

## 2023-02-12 DIAGNOSIS — M159 Polyosteoarthritis, unspecified: Secondary | ICD-10-CM | POA: Diagnosis not present

## 2023-02-12 DIAGNOSIS — Z978 Presence of other specified devices: Secondary | ICD-10-CM | POA: Diagnosis not present

## 2023-02-12 DIAGNOSIS — Z853 Personal history of malignant neoplasm of breast: Secondary | ICD-10-CM | POA: Diagnosis not present

## 2023-02-12 DIAGNOSIS — D649 Anemia, unspecified: Secondary | ICD-10-CM | POA: Diagnosis not present

## 2023-02-12 DIAGNOSIS — E1165 Type 2 diabetes mellitus with hyperglycemia: Secondary | ICD-10-CM | POA: Diagnosis not present

## 2023-02-12 DIAGNOSIS — L89619 Pressure ulcer of right heel, unspecified stage: Secondary | ICD-10-CM | POA: Diagnosis not present

## 2023-02-18 ENCOUNTER — Telehealth: Payer: Self-pay

## 2023-02-18 NOTE — Telephone Encounter (Signed)
330 pm.  Follow up call made to spouse after missed visit last month.  Discussed services and at this time spouse has declined Palliative Care.

## 2023-02-21 DIAGNOSIS — M5412 Radiculopathy, cervical region: Secondary | ICD-10-CM | POA: Diagnosis not present

## 2023-02-21 DIAGNOSIS — M5136 Other intervertebral disc degeneration, lumbar region: Secondary | ICD-10-CM | POA: Diagnosis not present

## 2023-02-21 DIAGNOSIS — M5416 Radiculopathy, lumbar region: Secondary | ICD-10-CM | POA: Diagnosis not present

## 2023-02-21 DIAGNOSIS — M4802 Spinal stenosis, cervical region: Secondary | ICD-10-CM | POA: Diagnosis not present

## 2023-02-21 DIAGNOSIS — M48062 Spinal stenosis, lumbar region with neurogenic claudication: Secondary | ICD-10-CM | POA: Diagnosis not present

## 2023-02-21 DIAGNOSIS — M5030 Other cervical disc degeneration, unspecified cervical region: Secondary | ICD-10-CM | POA: Diagnosis not present

## 2023-02-21 DIAGNOSIS — M5020 Other cervical disc displacement, unspecified cervical region: Secondary | ICD-10-CM | POA: Diagnosis not present

## 2023-02-26 ENCOUNTER — Ambulatory Visit: Payer: PPO | Admitting: Urology

## 2023-02-26 DIAGNOSIS — R339 Retention of urine, unspecified: Secondary | ICD-10-CM | POA: Diagnosis not present

## 2023-02-26 NOTE — Progress Notes (Signed)
Cath Change/ Replacement  Patient is present today for a catheter change due to urinary retention.  9 ml of water was removed from the balloon, a 16 FR foley cath was removed without difficulty.  Patient was cleaned and prepped in a sterile fashion with betadine.  A 16 FR Silastic foley cath was replaced into the bladder, no complications were noted. Urine return was noted 10 ml and urine was yellow in color. The balloon was filled with 10ml of sterile water. A night bag was attached for drainage.  Patient was given proper instruction on catheter care.    Performed by: Michiel Cowboy, PA-C and Honor Loh, CMA  Follow up: Patient states that she would like to work on becoming more ambulatory before proceeding with another voiding trial.  Therefore, she will return next month for catheter exchange.

## 2023-02-27 ENCOUNTER — Encounter: Payer: PPO | Attending: Internal Medicine | Admitting: Internal Medicine

## 2023-02-27 DIAGNOSIS — I890 Lymphedema, not elsewhere classified: Secondary | ICD-10-CM | POA: Insufficient documentation

## 2023-02-27 DIAGNOSIS — Z79899 Other long term (current) drug therapy: Secondary | ICD-10-CM | POA: Insufficient documentation

## 2023-02-27 DIAGNOSIS — E785 Hyperlipidemia, unspecified: Secondary | ICD-10-CM | POA: Diagnosis not present

## 2023-02-27 DIAGNOSIS — L89512 Pressure ulcer of right ankle, stage 2: Secondary | ICD-10-CM | POA: Insufficient documentation

## 2023-02-27 DIAGNOSIS — E1151 Type 2 diabetes mellitus with diabetic peripheral angiopathy without gangrene: Secondary | ICD-10-CM | POA: Insufficient documentation

## 2023-02-27 DIAGNOSIS — E11622 Type 2 diabetes mellitus with other skin ulcer: Secondary | ICD-10-CM | POA: Insufficient documentation

## 2023-02-27 DIAGNOSIS — I110 Hypertensive heart disease with heart failure: Secondary | ICD-10-CM | POA: Insufficient documentation

## 2023-02-27 DIAGNOSIS — L97822 Non-pressure chronic ulcer of other part of left lower leg with fat layer exposed: Secondary | ICD-10-CM | POA: Diagnosis not present

## 2023-02-27 DIAGNOSIS — L89210 Pressure ulcer of right hip, unstageable: Secondary | ICD-10-CM | POA: Insufficient documentation

## 2023-02-27 DIAGNOSIS — L89513 Pressure ulcer of right ankle, stage 3: Secondary | ICD-10-CM

## 2023-02-27 DIAGNOSIS — M86172 Other acute osteomyelitis, left ankle and foot: Secondary | ICD-10-CM | POA: Diagnosis not present

## 2023-02-27 DIAGNOSIS — I5032 Chronic diastolic (congestive) heart failure: Secondary | ICD-10-CM | POA: Diagnosis not present

## 2023-02-27 DIAGNOSIS — L89320 Pressure ulcer of left buttock, unstageable: Secondary | ICD-10-CM | POA: Insufficient documentation

## 2023-02-27 DIAGNOSIS — Z08 Encounter for follow-up examination after completed treatment for malignant neoplasm: Secondary | ICD-10-CM | POA: Diagnosis not present

## 2023-02-27 DIAGNOSIS — Z7401 Bed confinement status: Secondary | ICD-10-CM | POA: Diagnosis not present

## 2023-02-27 DIAGNOSIS — L89620 Pressure ulcer of left heel, unstageable: Secondary | ICD-10-CM | POA: Insufficient documentation

## 2023-02-27 DIAGNOSIS — E11621 Type 2 diabetes mellitus with foot ulcer: Secondary | ICD-10-CM | POA: Insufficient documentation

## 2023-02-27 DIAGNOSIS — I872 Venous insufficiency (chronic) (peripheral): Secondary | ICD-10-CM | POA: Diagnosis not present

## 2023-02-27 DIAGNOSIS — M19071 Primary osteoarthritis, right ankle and foot: Secondary | ICD-10-CM | POA: Insufficient documentation

## 2023-02-27 DIAGNOSIS — E1140 Type 2 diabetes mellitus with diabetic neuropathy, unspecified: Secondary | ICD-10-CM | POA: Insufficient documentation

## 2023-02-27 DIAGNOSIS — L89610 Pressure ulcer of right heel, unstageable: Secondary | ICD-10-CM | POA: Insufficient documentation

## 2023-02-27 DIAGNOSIS — Z853 Personal history of malignant neoplasm of breast: Secondary | ICD-10-CM | POA: Diagnosis not present

## 2023-02-27 DIAGNOSIS — Z9011 Acquired absence of right breast and nipple: Secondary | ICD-10-CM | POA: Diagnosis not present

## 2023-02-27 DIAGNOSIS — Z993 Dependence on wheelchair: Secondary | ICD-10-CM | POA: Diagnosis not present

## 2023-02-27 NOTE — Progress Notes (Signed)
[name], [name] ([id number]) [id number] Page 1 of 11 Visit Report for 02/27/2023 Arrival Information Details Patient Name: Date of Service: [facility], [place] [name]. 02/27/2023 1:00 PM Medical Record Number: [id number] Patient Account Number: [id number] Date of Birth/Sex: Treating RN: 10/05/1941 (82 y.o. [name], [name] Primary Care Jazilyn Siegenthaler: Barbette Reichmann Other Clinician: Referring Piers Baade: Treating Cleve Paolillo/Extender: Adolph Pollack Weeks in Treatment: 22 Visit Information History Since Last Visit Has Dressing in Place as Prescribed: Yes Patient Arrived: Wheel Chair Pain Present Now: Yes Arrival Time: 13:09 Accompanied By: son Transfer Assistance: EasyPivot Patient Lift Patient Identification Verified: Yes Secondary Verification Process Completed: Yes Patient Requires Transmission-Based Precautions: No Patient Has Alerts: Yes Patient Alerts: DM II ABI R 1.30 TBI 1.12 ABI [name] 1.27 TBI 1.21 Electronic Signature(s) Signed: 02/27/2023 2:56:04 PM By: Bonnell Public Entered By: Bonnell Public on 02/27/2023 13:10:20 -------------------------------------------------------------------------------- Encounter Discharge Information Details Patient Name: Date of Service: [facility], [place] [name]. 02/27/2023 1:00 PM Medical Record Number: [id number] Patient Account Number: [id number] Date of Birth/Sex: Treating RN: 24-Jul-1941 (82 y.o. [name], [name] Primary Care Lometa Riggin: Barbette Reichmann Other Clinician: Referring Retha Bither: Treating Texanna Hilburn/Extender: Adolph Pollack Weeks in Treatment: 22 Encounter Discharge Information Items Post Procedure Vitals Discharge Condition: Stable Temperature (F): 97.8 Ambulatory Status: Wheelchair Pulse (bpm): 81 Discharge Destination: Home Respiratory Rate (breaths/min): 18 Transportation: Private Auto Blood Pressure (mmHg): 106/67 Accompanied By: son Schedule Follow-up Appointment:  Yes Clinical Summary of Care: Electronic Signature(s) Signed: 02/27/2023 2:56:04 PM By: Bonnell Public Entered By: Bonnell Public on 02/27/2023 14:05:06 -------------------------------------------------------------------------------- Lower Extremity Assessment Details Patient Name: Date of Service: [facility], DIA [name]. 02/27/2023 1:00 PM Medical Record Number: [id number] Patient Account Number: [id number] Date of Birth/Sex: Treating RN: [birth year] (82 y.o. [name], [name] Primary Care Chaniya Genter: Barbette Reichmann Other Clinician: Referring Gianlucas Evenson: Treating Lynnwood Beckford/Extender: Marcha Dutton, Vishwanath Weeks in Treatment: 22 Edema Assessment Assessed: [Left: No] [Right: No] Edema: [Left: No] [Right: Yes] M[Left: CCORMICK, Rhianne [name] ([id number])] Franne Forts: [id number] Page 2 of 11] [Left: Vascular Assessmen] [Right: t] Left: [Left: Right] [Right: :] [Left: Pulses] [Right: :] [Left: Dorsalis Pedi] [Right: s] Palpable: [Left: Yes] [Right: Yes] Electronic Signature(s) Signed: 02/27/2023 2:56:04 PM By: Bonnell Public Entered By: Bonnell Public on 02/27/2023 14:00:10 -------------------------------------------------------------------------------- Multi-Disciplinary Care Plan Details Patient Name: Date of Service: [facility], DIA [name]. 02/27/2023 1:00 PM Medical Record Number: [id number] Patient Account Number: [id number] Date of Birth/Sex: Treating RN: [birth year] (82 y.o. [name], [name] Primary Care Isabelly Kobler: Barbette Reichmann Other Clinician: Referring Draycen Leichter: Treating Sophiamarie Nease/Extender: Adolph Pollack Weeks in Treatment: 22 Active Inactive Necrotic Tissue Nursing Diagnoses: Impaired tissue integrity related to necrotic/devitalized tissue Knowledge deficit related to management of necrotic/devitalized tissue Goals: Necrotic/devitalized tissue will be minimized in the wound bed Date Initiated: 10/31/2022 Target Resolution Date:  04/12/2023 Goal Status: Active Patient/caregiver will verbalize understanding of reason and process for debridement of necrotic tissue Date Initiated: 10/31/2022 Target Resolution Date: 04/12/2023 Goal Status: Active Interventions: Assess patient pain level pre-, during and post procedure and prior to discharge Provide education on necrotic tissue and debridement process Treatment Activities: Apply topical anesthetic as ordered : 10/31/2022 Biologic debridement : 10/31/2022 Notes: Osteomyelitis Nursing Diagnoses: Potential for infection: osteomyelitis Goals: Patient/caregiver will verbalize understanding of disease process and disease management Date Initiated: 10/31/2022 Target Resolution Date: 10/31/2022 Goal Status: Active Signs and symptoms for osteomyelitis will be recognized and promptly addressed Date Initiated: 10/31/2022 Target Resolution Date: 10/31/2022 Goal Status: Active Interventions: Assess for signs and symptoms of osteomyelitis resolution every visit  Treatment Activities: Systemic antibiotics : 10/31/2022 Notes: Electronic Signature(s) Signed: 02/27/2023 2:56:04 PM By: Bonnell Public Entered By: Bonnell Public on 02/27/2023 14:01:01 Valdez, [name] ([id number]) [id number] Page 3 of 11 -------------------------------------------------------------------------------- Pain Assessment Details Patient Name: Date of Service: [facility], DIA [name]. 02/27/2023 1:00 PM Medical Record Number: [id number] Patient Account Number: [id number] Date of Birth/Sex: Treating RN: Sep 27, 1941 (82 y.o. [name], [name] Primary Care Bahja Bence: Barbette Reichmann Other Clinician: Referring Westyn Keatley: Treating Timera Windt/Extender: Adolph Pollack Weeks in Treatment: 22 Active Problems Location of Pain Severity and Description of Pain Patient Has Paino Yes Site Locations Pain Location: Pain in Ulcers With Dressing Change: No Duration of the  Pain. Constant / Intermittento Intermittent How Long Does it Lasto Hours: 1 Minutes: Character of Pain Describe the Pain: Aching Pain Management and Medication Current Pain Management: Medication: Yes Electronic Signature(s) Signed: 02/27/2023 2:56:04 PM By: Bonnell Public Entered By: Bonnell Public on 02/27/2023 13:13:41 -------------------------------------------------------------------------------- Patient/Caregiver Education Details Patient Name: Date of Service: [facility], DIA [name]. 4/17/2024andnbsp1:00 PM Medical Record Number: [id number] Patient Account Number: [id number] Date of Birth/Gender: Treating RN: [birth year] (82 y.o. [name], [name] Primary Care Physician: Barbette Reichmann Other Clinician: Referring Physician: Treating Physician/Extender: Adolph Pollack Weeks in Treatment: 22 Education Assessment Education Provided To: Patient and Caregiver Education Topics Provided Nutrition: Handouts: Nutrition Methods: Explain/Verbal Responses: State content correctly Offloading: Handouts: Other: OFFLOADING FOR PRESSURE RELIEF Responses: State content correctly [name], [name] ([id number]) [id number] Page 4 of 11 Wound Debridement: Handouts: Wound Debridement Methods: Explain/Verbal Responses: State content correctly Electronic Signature(s) Signed: 02/27/2023 2:56:04 PM By: Bonnell Public Entered By: Bonnell Public on 02/27/2023 14:01:41 -------------------------------------------------------------------------------- Wound Assessment Details Patient Name: Date of Service: [facility], DIA [name]. 02/27/2023 1:00 PM Medical Record Number: [id number] Patient Account Number: [id number] Date of Birth/Sex: Treating RN: 11-Jan-1941 (83 y.o. [name], [name] Primary Care Malek Skog: Barbette Reichmann Other Clinician: Referring Daimon Kean: Treating Odysseus Cada/Extender: Adolph Pollack Weeks in Treatment: 22 Wound  Status Wound Number: 10 Primary Pressure Ulcer Etiology: Wound Location: Left Metatarsal head first Wound Healed - Epithelialized Wounding Event: Pressure Injury Status: Date Acquired: 10/23/2022 Comorbid Cataracts, Lymphedema, Hypertension, Peripheral Arterial Weeks Of Treatment: 18 History: Disease, Peripheral Venous Disease, Type II Diabetes, Clustered Wound: No Osteoarthritis, Neuropathy Photos Wound Measurements Length: (cm) Width: (cm) Depth: (cm) Area: (cm) Volume: (cm) 0 % Reduction in Area: 100% 0 % Reduction in Volume: 100% 0 Epithelialization: Large (67-100%) 0 0 Wound Description Classification: Category/Stage IV Wound Margin: Flat and Intact Exudate Amount: None Present Foul Odor After Cleansing: No Slough/Fibrino No Wound Bed Granulation Amount: None Present (0%) Exposed Structure Necrotic Amount: None Present (0%) Fascia Exposed: No Fat Layer (Subcutaneous Tissue) Exposed: No Tendon Exposed: No Muscle Exposed: No Joint Exposed: No Bone Exposed: No Treatment Notes Wound #10 (Metatarsal head first) Wound Laterality: Left Cleanser Peri-Wound Care [name], [name] ([id number]) [id number] Page 5 of 11 Topical Primary Dressing Secondary Dressing Secured With Compression Wrap Compression Stockings Add-Ons Electronic Signature(s) Signed: 02/27/2023 2:56:04 PM By: Bonnell Public Entered By: Bonnell Public on 02/27/2023 14:21:53 -------------------------------------------------------------------------------- Wound Assessment Details Patient Name: Date of Service: [facility], DIA [name]. 02/27/2023 1:00 PM Medical Record Number: [id number] Patient Account Number: [id number] Date of Birth/Sex: Treating RN: 09/17/1941 (82 y.o. [name], [name] Primary Care Norvell Caswell: Barbette Reichmann Other Clinician: Referring Andrey Mccaskill: Treating Noell Lorensen/Extender: Adolph Pollack Weeks in Treatment: 22 Wound Status Wound  Number: 11 Primary Pressure Ulcer Etiology: Wound Location: Right Trochanter Wound Open Wounding Event: Pressure Injury  Status: Date Acquired: 10/24/2022 Comorbid Cataracts, Lymphedema, Hypertension, Peripheral Arterial Weeks Of Treatment: 17 History: Disease, Peripheral Venous Disease, Type II Diabetes, Clustered Wound: No Osteoarthritis, Neuropathy Photos Wound Measurements Length: (cm) 4 Width: (cm) 5 Depth: (cm) 1.1 Area: (cm) 15.708 Volume: (cm) 17.279 % Reduction in Area: -657.7% % Reduction in Volume: -8247.3% Epithelialization: None Undermining: Yes Starting Position (o'clock): 1 Ending Position (o'clock): 11 Maximum Distance: (cm) 1.9 Wound Description Classification: Category/Stage IV Wound Margin: Well defined, not attached Exudate Amount: Medium Exudate Type: Serosanguineous Exudate Color: red, brown Foul Odor After Cleansing: No Slough/Fibrino Yes Wound Bed Granulation Amount: Medium (34-66%) Exposed Structure Granulation Quality: Red Fascia Exposed: Yes Necrotic Amount: Medium (34-66%) Fat Layer (Subcutaneous Tissue) Exposed: Yes Necrotic Quality: Adherent Slough Tendon Exposed: No Muscle Exposed: Yes Sacra, Maelin [name] ([id number]) [id number] Page 6 of 11 Necrosis of Muscle: No Joint Exposed: No Bone Exposed: No Treatment Notes Wound #11 (Trochanter) Wound Laterality: Right Cleanser Soap and Water Discharge Instruction: Gently cleanse wound with antibacterial soap, rinse and pat dry prior to dressing wounds Vashe 5.8 (oz) Discharge Instruction: Use vashe 5.8 (oz) as directed Peri-Wound Care Topical Primary Dressing Gauze Discharge Instruction: Vashe wet to dry Secondary Dressing Secured With Compression Wrap Compression Stockings Add-Ons Electronic Signature(s) Signed: 02/27/2023 2:56:04 PM By: Bonnell Public Entered By: Bonnell Public on 02/27/2023  14:14:47 -------------------------------------------------------------------------------- Wound Assessment Details Patient Name: Date of Service: [facility], DIA [name]. 02/27/2023 1:00 PM Medical Record Number: [id number] Patient Account Number: [id number] Date of Birth/Sex: Treating RN: 10/17/1941 (81 y.o. [name], [name] Primary Care Abdulkadir Emmanuel: Barbette Reichmann Other Clinician: Referring Anedra Penafiel: Treating Breckon Reeves/Extender: Adolph Pollack Weeks in Treatment: 22 Wound Status Wound Number: 12 Primary Pressure Ulcer Etiology: Wound Location: Right, Medial Ankle Wound Open Wounding Event: Pressure Injury Status: Date Acquired: 12/05/2022 Comorbid Cataracts, Lymphedema, Hypertension, Peripheral Arterial Weeks Of Treatment: 12 History: Disease, Peripheral Venous Disease, Type II Diabetes, Clustered Wound: No Osteoarthritis, Neuropathy Photos Wound Measurements Length: (cm) 3.4 Width: (cm) 3.5 Depth: (cm) 0.2 Area: (cm) 9.346 [name], [name] ([id number]) Volume: (cm) 1.869 % Reduction in Area: -138% % Reduction in Volume: -375.6% Epithelialization: None Tunneling: No [id number] Page 7 of 11 Undermining: No Wound Description Classification: Category/Stage IV Wound Margin: Flat and Intact Exudate Amount: Medium Exudate Type: Serosanguineous Exudate Color: red, brown Foul Odor After Cleansing: No Slough/Fibrino Yes Wound Bed Granulation Amount: Medium (34-66%) Exposed Structure Granulation Quality: Red, Hyper-granulation Fascia Exposed: No Necrotic Amount: Small (1-33%) Fat Layer (Subcutaneous Tissue) Exposed: Yes Necrotic Quality: Adherent Slough Tendon Exposed: No Muscle Exposed: No Joint Exposed: No Bone Exposed: No Assessment Notes palpable bone though thin layer of tissue Treatment Notes Wound #12 (Ankle) Wound Laterality: Right, Medial Cleanser Soap and Water Discharge Instruction: Gently cleanse wound  with antibacterial soap, rinse and pat dry prior to dressing wounds Vashe 5.8 (oz) Discharge Instruction: Use vashe 5.8 (oz) as directed Peri-Wound Care Topical Primary Dressing Honey: Activon Honey Gel, 25 (g) Tube Secondary Dressing Gauze Discharge Instruction: As directed: dry, moistened with saline or moistened with Dakins Solution Hydrofera Blue Ready Transfer Foam, 2.5x2.5 (in/in) Discharge Instruction: Apply to wound bed over non-stick dressing. Secured With Medipore T - 44M Medipore H Soft Cloth Surgical T ape ape, 2x2 (in/yd) Compression Wrap Compression Stockings Add-Ons Electronic Signature(s) Signed: 02/27/2023 2:56:04 PM By: Bonnell Public Entered By: Bonnell Public on 02/27/2023 14:18:53 -------------------------------------------------------------------------------- Wound Assessment Details Patient Name: Date of Service: [facility], DIA [name]. 02/27/2023 1:00 PM Medical Record Number: [id number] Patient Account Number: [id number] Date of  Birth/Sex: Treating RN: [birth year] (82 y.o. [name], [name] Primary Care Shamicka Inga: Barbette Reichmann Other Clinician: Referring Alayja Armas: Treating Cleda Imel/Extender: Adolph Pollack Weeks in Treatment: 22 Wound Status Wound Number: 13 Primary Pressure Ulcer Etiology: Wound Location: Left Calcaneus Wound Open Wounding Event: Pressure Injury Status: Date Acquired: 11/28/2022 Comorbid Cataracts, Lymphedema, Hypertension, Peripheral Arterial Weeks Of Treatment: 12 History: Disease, Peripheral Venous Disease, Type II Diabetes, Clustered Wound: No [name], [name] ([id number]) [id number] Page 8 of 11 Clustered Wound: No Osteoarthritis, Neuropathy Photos Wound Measurements Length: (cm) 1.5 Width: (cm) 3 Depth: (cm) 0.3 Area: (cm) 3.534 Volume: (cm) 1.06 % Reduction in Area: -2.3% % Reduction in Volume: -206.4% Epithelialization: None Tunneling: No Undermining: No Wound  Description Classification: Category/Stage III Wound Margin: Flat and Intact Exudate Amount: Medium Exudate Type: Serosanguineous Exudate Color: red, brown Foul Odor After Cleansing: No Slough/Fibrino Yes Wound Bed Granulation Amount: Small (1-33%) Exposed Structure Granulation Quality: Pink Fascia Exposed: No Necrotic Amount: Large (67-100%) Fat Layer (Subcutaneous Tissue) Exposed: Yes Necrotic Quality: Adherent Slough Tendon Exposed: No Muscle Exposed: No Joint Exposed: No Bone Exposed: No Treatment Notes Wound #13 (Calcaneus) Wound Laterality: Left Cleanser Soap and Water Discharge Instruction: Gently cleanse wound with antibacterial soap, rinse and pat dry prior to dressing wounds Vashe 5.8 (oz) Discharge Instruction: Use vashe 5.8 (oz) as directed Peri-Wound Care Topical Primary Dressing Honey: Activon Honey Gel, 25 (g) Tube Secondary Dressing Gauze Discharge Instruction: As directed: dry, moistened with saline or moistened with Dakins Solution Hydrofera Blue Ready Transfer Foam, 2.5x2.5 (in/in) Discharge Instruction: Apply to wound bed over non-stick dressing. Secured With Medipore T - 29M Medipore H Soft Cloth Surgical T ape ape, 2x2 (in/yd) Kerlix Roll Sterile or Non-Sterile 6-ply 4.5x4 (yd/yd) Discharge Instruction: Apply Kerlix as directed Compression Wrap Compression Stockings Add-Ons Electronic Signature(s) Signed: 02/27/2023 2:56:04 PM By: Nani Ravens, Kambria [name] ([id number]) By: Horton Finer.pdf Page 9 of 11 Signed: 02/27/2023 2:56:04 PM Entered By: Bonnell Public on 02/27/2023 14:20:24 -------------------------------------------------------------------------------- Wound Assessment Details Patient Name: Date of Service: [facility], DIA [name]. 02/27/2023 1:00 PM Medical Record Number: [id number] Patient Account Number: [id number] Date of Birth/Sex: Treating RN: Nov 10, 1941 (82 y.o. [name], [name] Primary Care  Diamond Jentz: Barbette Reichmann Other Clinician: Referring Tommi Crepeau: Treating Colisha Redler/Extender: Adolph Pollack Weeks in Treatment: 22 Wound Status Wound Number: 6 Primary Pressure Ulcer Etiology: Wound Location: Right Calcaneus Wound Open Wounding Event: Pressure Injury Status: Date Acquired: 07/23/2022 Comorbid Cataracts, Lymphedema, Hypertension, Peripheral Arterial Weeks Of Treatment: 22 History: Disease, Peripheral Venous Disease, Type II Diabetes, Clustered Wound: No Osteoarthritis, Neuropathy Photos Wound Measurements Length: (cm) 1.5 Width: (cm) 2 Depth: (cm) 0.2 Area: (cm) 2.356 Volume: (cm) 0.471 % Reduction in Area: 75% % Reduction in Volume: 83.3% Epithelialization: None Wound Description Classification: Category/Stage III Wound Margin: Distinct, outline attached Exudate Amount: Medium Exudate Type: Serosanguineous Exudate Color: red, brown Foul Odor After Cleansing: No Slough/Fibrino Yes Wound Bed Granulation Amount: Small (1-33%) Exposed Structure Granulation Quality: Pink Fascia Exposed: No Necrotic Amount: Large (67-100%) Fat Layer (Subcutaneous Tissue) Exposed: Yes Necrotic Quality: Adherent Slough Tendon Exposed: No Muscle Exposed: No Joint Exposed: No Bone Exposed: No Treatment Notes Wound #6 (Calcaneus) Wound Laterality: Right Cleanser Byram Ancillary Kit - 15 Day Supply Discharge Instruction: Use supplies as instructed; Kit contains: (15) Saline Bullets; (15) 3x3 Gauze; 15 pr Gloves Soap and Water Discharge Instruction: Gently cleanse wound with antibacterial soap, rinse and pat dry prior to dressing wounds Vashe 5.8 (oz) Discharge Instruction: Use vashe 5.8 (oz) as directed  Peri-Wound Care [name], [name] ([id number]) [id number] Page 10 of 11 Topical Activon Honey Gel, 25 (g) Tube Primary Dressing Hydrofera Blue Ready Transfer Foam, 2.5x2.5 (in/in) Discharge Instruction: Apply Hydrofera  Blue Ready to wound bed as directed Secondary Dressing ABD Pad 5x9 (in/in) Discharge Instruction: Cover with ABD pad Kerlix 4.5 x 4.1 (in/yd) Discharge Instruction: Apply Kerlix 4.5 x 4.1 (in/yd) as instructed Secured With Medipore T - 82M Medipore H Soft Cloth Surgical T ape ape, 2x2 (in/yd) Compression Wrap Compression Stockings Add-Ons Electronic Signature(s) Signed: 02/27/2023 2:56:04 PM By: Bonnell Public Entered By: Bonnell Public on 02/27/2023 14:20:57 -------------------------------------------------------------------------------- Wound Assessment Details Patient Name: Date of Service: [facility], DIA [name]. 02/27/2023 1:00 PM Medical Record Number: [id number] Patient Account Number: [id number] Date of Birth/Sex: Treating RN: [birth year] (82 y.o. [name], [name] Primary Care Kameren Baade: Barbette Reichmann Other Clinician: Referring Otha Rickles: Treating Reyann Troop/Extender: Adolph Pollack Weeks in Treatment: 22 Wound Status Wound Number: 8 Primary Pressure Ulcer Etiology: Wound Location: Midline Sacrum Wound Open Wounding Event: Pressure Injury Status: Date Acquired: 07/23/2022 Comorbid Cataracts, Lymphedema, Hypertension, Peripheral Arterial Weeks Of Treatment: 22 History: Disease, Peripheral Venous Disease, Type II Diabetes, Clustered Wound: No Osteoarthritis, Neuropathy Photos Wound Measurements Length: (cm) 0.7 Width: (cm) 1 Depth: (cm) 0.3 Area: (cm) 0.55 Volume: (cm) 0.165 % Reduction in Area: 90% % Reduction in Volume: 90% Epithelialization: None Wound Description Classification: Category/Stage III Wound Margin: Distinct, outline attached Exudate Amount: Small Exudate Type: Serosanguineous Exudate Color: red, brown [name], [name] ([id number]) Wound Bed Granulation Amount: Medium (34-66%) Granulation Quality: Red Necrotic Amount: Small (1-33%) Necrotic Quality: Adherent Slough Foul Odor After Cleansing: Yes Due to Product Use:  No Slough/Fibrino Yes [id number].pdf Page 11 of 11 Exposed Structure Fascia Exposed: No Fat Layer (Subcutaneous Tissue) Exposed: Yes Tendon Exposed: No Muscle Exposed: No Joint Exposed: No Bone Exposed: No Treatment Notes Wound #8 (Sacrum) Wound Laterality: Midline Cleanser Soap and Water Discharge Instruction: Gently cleanse wound with antibacterial soap, rinse and pat dry prior to dressing wounds Vashe 5.8 (oz) Discharge Instruction: Use vashe 5.8 (oz) as directed Peri-Wound Care Topical Primary Dressing Gauze Discharge Instruction: moistened with Vashe Solution for wet to dry dressing Secondary Dressing (BORDER) Zetuvit Plus SILICONE BORDER Dressing 5x5 (in/in) Discharge Instruction: Please do not put silicone bordered dressings under wraps. Use non-bordered dressing only. Secured With Compression Wrap Compression Stockings Add-Ons Electronic Signature(s) Signed: 02/27/2023 2:56:04 PM By: Bonnell Public Entered By: Bonnell Public on 02/27/2023 14:21:25 -------------------------------------------------------------------------------- Vitals Details Patient Name: Date of Service: [facility], DIA [name]. 02/27/2023 1:00 PM Medical Record Number: [id number] Patient Account Number: [id number] Date of Birth/Sex: Treating RN: [birth year] (82 y.o. F) Coulter, [name] Primary Care Tayte Childers: Barbette Reichmann Other Clinician: Referring Ashaunti Treptow: Treating Kyandra Mcclaine/Extender: Adolph Pollack Weeks in Treatment: 22 Vital Signs Time Taken: 13:07 Temperature (F): 97.8 Weight (lbs): 116 Pulse (bpm): 81 Respiratory Rate (breaths/min): 18 Blood Pressure (mmHg): 106/67 Reference Range: 80 - 120 mg / dl Electronic Signature(s) Signed: 02/27/2023 2:56:04 PM By: Bonnell Public Entered By: Bonnell Public on 02/27/2023 13:10:43

## 2023-02-28 ENCOUNTER — Ambulatory Visit: Payer: PPO | Admitting: Urology

## 2023-02-28 NOTE — Progress Notes (Signed)
VERGIE, ZAHM ([id number]) [id number] Page 1 of 12 Visit Report for 02/27/2023 Chief Complaint Document Details Patient Name: Date of Service: [facility] [name]. 02/27/2023 1:00 PM Medical Record Number: [id number] Patient Account Number: [id number] Date of Birth/Sex: Treating RN: 21-Mar-1941 (82 y.o. [name], [name] Primary Care Provider: Barbette Reichmann Other Clinician: Referring Provider: Treating Provider/Extender: Adolph Pollack Weeks in Treatment: 22 Information Obtained from: Patient Chief Complaint 09/26/2022; left lower extremity wound, right heel wound and left buttocks wound Electronic Signature(s) Signed: 02/27/2023 2:17:41 PM By: Geralyn Corwin DO Entered By: Geralyn Corwin on 02/27/2023 13:48:51 -------------------------------------------------------------------------------- Debridement Details Patient Name: Date of Service: [facility], [name] 02/27/2023 1:00 PM Medical Record Number: [id number] Patient Account Number: [id number] Date of Birth/Sex: Treating RN: 1941/07/15 (82 y.o. F) Coulter, [name] Primary Care Provider: Barbette Reichmann Other Clinician: Referring Provider: Treating Provider/Extender: Adolph Pollack Weeks in Treatment: 22 Debridement Performed for Assessment: Wound #12 Right,Medial Ankle Performed By: Physician Geralyn Corwin, MD Debridement Type: Debridement Level of Consciousness (Pre-procedure): Awake and Alert Pre-procedure Verification/Time Out Yes - 13:40 Taken: Start Time: 13:40 Pain Control: Lidocaine 2% T opical Gel T Area Debrided ([name] x W): otal 3.4 (cm) x 3.5 (cm) = 11.9 (cm) Tissue and other material debrided: Slough, Slough Level: Non-Viable Tissue Debridement Description: Selective/Open Wound Instrument: Curette Bleeding: Minimum Hemostasis Achieved: Pressure End Time: 13:41 Procedural Pain: 0 Post Procedural Pain: 0 Response to Treatment:  Procedure was tolerated well Level of Consciousness (Post- Awake and Alert procedure): Post Debridement Measurements of Total Wound Length: (cm) 3.4 Stage: Category/Stage IV Width: (cm) 3.5 Depth: (cm) 0.2 Volume: (cm) 1.869 Character of Wound/Ulcer Post Debridement: Improved Post Procedure Diagnosis Same as Pre-procedure Electronic Signature(s) Signed: 02/27/2023 2:17:41 PM By: Geralyn Corwin DO Signed: 02/27/2023 2:56:04 PM By: Bonnell Public Entered By: Bonnell Public on 02/27/2023 13:46:42 [name], [name] ([id number]) [id number] Page 2 of 12 -------------------------------------------------------------------------------- Debridement Details Patient Name: Date of Service: [facility], [name] 02/27/2023 1:00 PM Medical Record Number: [id number] Patient Account Number: [id number] Date of Birth/Sex: Treating RN: 12-18-1940 (82 y.o. [name], [name] Primary Care Provider: Barbette Reichmann Other Clinician: Referring Provider: Treating Provider/Extender: Adolph Pollack Weeks in Treatment: 22 Debridement Performed for Assessment: Wound #6 Right Calcaneus Performed By: Physician Geralyn Corwin, MD Debridement Type: Debridement Level of Consciousness (Pre-procedure): Awake and Alert Pre-procedure Verification/Time Out Yes - 13:40 Taken: Start Time: 13:40 Pain Control: Lidocaine 2% T opical Gel T Area Debrided ([name] x W): otal 1.5 (cm) x 2 (cm) = 3 (cm) Tissue and other material debrided: Non-Viable, Slough, Slough Level: Non-Viable Tissue Debridement Description: Selective/Open Wound Instrument: Curette Bleeding: Minimum Hemostasis Achieved: Pressure End Time: 13:41 Procedural Pain: 0 Post Procedural Pain: 0 Response to Treatment: Procedure was tolerated well Level of Consciousness (Post- Awake and Alert procedure): Post Debridement Measurements of Total Wound Length: (cm) 1.5 Stage: Category/Stage III Width: (cm)  2 Depth: (cm) 0.2 Volume: (cm) 0.471 Character of Wound/Ulcer Post Debridement: Improved Post Procedure Diagnosis Same as Pre-procedure Electronic Signature(s) Signed: 02/27/2023 2:17:41 PM By: Geralyn Corwin DO Signed: 02/27/2023 2:56:04 PM By: Bonnell Public Entered By: Bonnell Public on 02/27/2023 13:58:21 -------------------------------------------------------------------------------- Debridement Details Patient Name: Date of Service: [facility], [name] 02/27/2023 1:00 PM Medical Record Number: [id number] Patient Account Number: [id number] Date of Birth/Sex: Treating RN: January 15, 1941 (83 y.o. [name], [name] Primary Care Provider: Barbette Reichmann Other Clinician: Referring Provider: Treating Provider/Extender: Adolph Pollack Weeks in Treatment: 22 Debridement Performed for Assessment: Wound #  13 Left Calcaneus Performed By: Physician Geralyn Corwin, MD Debridement Type: Debridement Level of Consciousness (Pre-procedure): Awake and Alert Pre-procedure Verification/Time Out Yes - 13:40 Taken: Start Time: 13:40 Pain Control: Lidocaine 2% T opical Gel T Area Debrided ([name] x W): otal 1.5 (cm) x 3 (cm) = 4.5 (cm) Tissue and other material debrided: Slough, Subcutaneous, Slough Level: Skin/Subcutaneous Tissue Debridement Description: Excisional Instrument: Curette Bleeding: Minimum Hemostasis Achieved: Pressure End Time: 13:41 Procedural Pain: 0 Post Procedural Pain: 0 [name], [name] ([id number]) [id number] Page 3 of 12 Response to Treatment: Procedure was tolerated well Level of Consciousness (Post- Awake and Alert procedure): Post Debridement Measurements of Total Wound Length: (cm) 1.5 Stage: Category/Stage III Width: (cm) 3 Depth: (cm) 0.3 Volume: (cm) 1.06 Character of Wound/Ulcer Post Debridement: Improved Post Procedure Diagnosis Same as Pre-procedure Electronic Signature(s) Signed: 02/27/2023 2:17:41 PM  By: Geralyn Corwin DO Signed: 02/27/2023 2:56:04 PM By: Bonnell Public Entered By: Bonnell Public on 02/27/2023 13:59:53 -------------------------------------------------------------------------------- HPI Details Patient Name: Date of Service: [facility], [name] 02/27/2023 1:00 PM Medical Record Number: [id number] Patient Account Number: [id number] Date of Birth/Sex: Treating RN: January 20, 1941 (82 y.o. [name], [name] Primary Care Provider: Barbette Reichmann Other Clinician: Referring Provider: Treating Provider/Extender: Adolph Pollack Weeks in Treatment: 22 History of Present Illness HPI Description: 82 year old patient who comes with a referral for bilateral lower extremity edema and a lower extremity ulceration and has been sent by her PCP [name]. I understand the patient was recently put on amoxicillin and doxycycline but could not tolerate the amoxicillin. doxycycline course was completed. a BNP and EKG was supposed to be normal and the patient did not have any dyspnea. the patient has been on a diuretic. The patient was also prescribed a pair of elastic compression stockings of the 20-30 mmHg pressure variety. x-ray of the right ankle was done on 09/20/2015 and showed posttraumatic and postsurgical changes of the right ankle with secondary degenerative changes of the tibiotalar joint and to a lesser degree the subtalar joint. No definite acute bony abnormalities are noted. Past medical history significant for diabetes mellitus, hypertension, hyperlipidemia, right breast cancer treated with a mastectomy in 2014. She has never smoked. 10/24/2015 -- she had delayed her vascular test because of her husband surgery but she is now ready to get him taken care of. He is also unable to use compression stockings and hence we will need to order her Juzo wraps. 10/31/2015-- was seen by Dr. Wyn Quaker on 10/28/2015. She had a left lower extremity arterial duplex done at his  office a couple of years ago and that was essentially normal. Today they performed a venous duplex which revealed no evidence of deep vein thrombosis, superficial thrombophlebitis, no venous reflex was seen on the right and a minimal amount of reflux was seen on the left great saphenous vein but no significant reflux was seen. Impression was that there was a component of lymphedema present from a previous surgery and he would recommend compression stockings and leg elevation. Readmission: 07-24-2022 upon evaluation today patient presents for initial inspection here in our clinic concerning issues that she has been having with her legs this is actually been going on for several years according to what her family member with her today tells me as well as what the patient reiterates as well. She is currently most recently been seeing Dr. Ether Griffins and subsequently he had her in [place] boots. However 2 weeks ago he referred her to [place] and then subsequently took  her out of the [employer] wraps at that point. At this time the left leg looks to be worse in the right leg currently. She is on Lasix and lisinopril with hydrochlorothiazide she has high blood pressure she also has issues currently with lower extremity swelling and edema which has been an ongoing issue for her as well. Patient does have a history of chronic venous hypertension, lymphedema, diabetes mellitus type 2, hypertension, peripheral vascular disease, and neuropathy. Currently she is on Lasix as well as lisinopril with hydrochlorothiazide. 08-02-2022 upon evaluation today patient presents for reevaluation the good news is she is actually doing somewhat better in regard to the wound and the overall appearance and sinuses. The unfortunate thing is her infection really is not significantly improved we did have to switch out her antibiotic once we got that final result back and I switched her to Levaquin and away from the doxycycline. Unfortunately the  doxycycline had been doing poorly for her. In fact she had had diarrhea from the time she started taking it on Friday and she is still having it when she shows up today for evaluation. Again I was not aware of this and obviously she does appear to be somewhat dehydrated as well based on what I see. My concern which I discussed with the patient today is the possibility of a C. difficile infection. With that being said fact this started immediately upon taking the doxycycline makes me think that it was just the medicine and is not completely out of her system yet despite having taken the last dose Tuesday morning. Nonetheless with what we are seeing currently I want to be sure that reason I Minna contact her primary care provider and see if a would be willing to see her and test for C. difficile infection. 08-07-2022 upon evaluation today patient appears to be doing well currently in regard to her wounds which are actually measuring much better this is great news. Fortunately I do not see any signs of active infection locally or systemically at this time which is great as well. The good news is she was tested for a C. difficile infection and it was negative. I am very pleased and thankful for primary care provider for doing that so this means that she was just having a severe reaction to the doxycycline we have added that to her allergy list at this point. 08-14-2022 upon evaluation today patient appears to be doing well currently in regard to her dehydration she is actually significantly improved compared to last time I saw her last week. With that being said I do not see any evidence of active infection systemically at this time which is great news. However locally she still does appear to have cellulitis in left lower extremity. I discussed with her today that I do believe she would benefit from going ahead and starting the Levaquin. Again we will concerned about the C. difficile infection of the  diarrhea but that this turned out to be just an issue with a reaction to the doxycycline. My hope is that the Levaquin will not cause her any complication and will be able to treat the infection. I did review her arterial study as well which was dated on [name], [name] ([id number]) [id number] Page 4 of 12 07-31-2022. It showed that she had a ABI on the right of 1.30 and on the left of 1.27 with a TBI on the right of 1.12 and the left 1.21 this is a normal arterial study.  Again on the patient's wound culture she actually did show evidence of Proteus, Morganella, and MRSA. Levaquin is a good option here across the board. 09/26/2022 [name] is a 82 year old female with a past medical history of type 2 diabetes currently controlled on oral agents, venous insufficiency/lymphedema, right breast cancer and chronic diastolic heart failure that presents to the clinic for a 1 month history of nonhealing wounds to the left lower extremity, right heel and left buttocks. She states that the buttocks wound and the right heel wound developed while she was in the hospital. She was admitted on 08/22/2022 for severe sepsis secondary to acute sigmoid and distal colonic diverticulitis. At that time it was noted she had a stage I decubitus ulcer to her bilateral buttocks. A wound to the heel was not mentioned. She currently denies systemic signs of infection. She came into clinic in a blue gown with no undergarments. She has not been dressing the wounds. She states she just recently obtained home health and they are coming out for the first time this week. She is seen in our clinic often for lower extremity wounds secondary to venous insufficiency. 11/22; patient presents for follow-up. Son is present during the encounter. Per son it sounds like they are not doing any dressing changes. She states she has home health but they have not come out. She is going to let us know which  home health agency she has been approved for so we can send orders. Currently she denies signs of infection. 12/13; patient presents for follow-up. Patient has home health and they are coming out once a week. It appears that there has not been any dressing changes except for with home health. Patient has a newly discovered wound to the left foot. There is exposed bone. There is slight erythema and increased warmth to the surrounding tissue. Patient is completely unaware of this wound. 12/20; patient presents for follow-up. She has been taking her oral antibiotics. She now has an eschar and wound to the right hip. She has been using Dakin's wet-to-dry dressings to the left foot wound and buttocks wound. She has been using Medihoney and silver alginate to the right heel wound. She currently denies systemic signs of infection. She is mainly bedbound or in a wheelchair. 1/10; patient presents for follow-up. Patient had her left second toenail removed by Dr. Ether Griffins, podiatry on 12/21. Unfortunately she did not feel well and she was admitted to the hospital for sepsis. Her right heel wound was thought to be infected and this was debrided in the OR by Dr. Ether Griffins. Culture and bone biopsy had no growth. She has been using Medihoney to all the wound beds except for the lefty buttocks wound for which she uses Dakin's wet-to-dry dressings. She has developed a pressure injury to the left heel now. This is despite having Prevalon boots. Although she is not wearing them today. She has completed 4 weeks of Augmentin and also a week of doxycycline for osteomyelitis of the left foot. Her left foot wound no longer probes to bone. Currently she denies signs of infection. 1/24; patient presents for follow-up. She has been using Medihoney and Hydrofera Blue to the wound beds except for this buttocks wound she has been using Dakin's wet-to-dry dressings. She has developed 2 new wounds 1 to the left heel and the other to the  right medial ankle. She claims she is wearing her Prevalon boots all the time although she does not have them on today. She  currently denies signs of infection. 2/21; patient presents for follow-up. She was recently hospitalized on 12/19/2022 for sepsis secondary to Enterococcus bacillus bacteremia. She was treated with IV antibiotics and has completed her discharge oral antibiotics. She had a CT scanning of the abdomen/pelvis without acute abnormalities. Husband is present with patient today. They have been using Dakin's wet-to-dry dressings to the sacral wound and hip wound and Hydrofera Blue and Medihoney to the feet wounds. They are not changing the dressings daily. It is unclear how often they actually change the dressings. She has been wearing her Prevalon boots at night but not during the day. She currently denies systemic signs of infection. 3/20; patient presents for follow-up. She again was in the hospital for severe sepsis on 2/24; MRIs at that time showed findings suspicious for osteomyelitis to the right hip. She is currently on Augmentin to complete 4 weeks of this. She is following with ID. She has been using Dakin's wet-to-dry dressings to the sacrum and right hip however the solution is starting to cause discomfort. T the rest of the wound she has been using Hydrofera Blue and Medihoney. Patient o and son are not interested in doing palliative care or hospice. They state that they had discussions while in the hospital. 4/17; patient presents for follow-up. She has been using Vashe wet-to-dry dressings to the sacrum and right trochanter wound. She has been using Medihoney and Hydrofera Blue to the bilateral feet wounds. She has no issues or complaints today. She denies signs of infection. Electronic Signature(s) Signed: 02/27/2023 2:17:41 PM By: Geralyn Corwin DO Entered By: Geralyn Corwin on 02/27/2023  13:49:42 -------------------------------------------------------------------------------- Physical Exam Details Patient Name: Date of Service: [facility], [name] 02/27/2023 1:00 PM Medical Record Number: [id number] Patient Account Number: [id number] Date of Birth/Sex: Treating RN: 01-22-1941 (82 y.o. [name], [name] Primary Care Provider: Barbette Reichmann Other Clinician: Referring Provider: Treating Provider/Extender: [name], [name] Weeks in Treatment: 22 Constitutional . Cardiovascular . Psychiatric . Notes T the right heel there is an open wound with granulation tissue and non viable tissue. T the medial malleolus there is an open wound with granulation tissue and o o tightly adhered nonviable tissue. T the left buttocks there is an open wound with granulation tissue. T the left foot first metatarsal there is epithelialization to o o the previous wound site. T the left heel there is an open wound with granulation tissue and slough. T the right hip there is a wound with tendon and granulation o o tissue, No signs of overt acute infection. Electronic Signature(s) Signed: 02/27/2023 2:17:41 PM By: Jonelle Sidle ([id number]) By: Geralyn Corwin DO [id number] Page 5 of 12 Signed: 02/27/2023 2:17:41 PM Entered By: Geralyn Corwin on 02/27/2023 13:51:52 -------------------------------------------------------------------------------- Physician Orders Details Patient Name: Date of Service: [facility], [name] 02/27/2023 1:00 PM Medical Record Number: [id number] Patient Account Number: [id number] Date of Birth/Sex: Treating RN: 12/04/1940 (82 y.o. [name], [name] Primary Care Provider: Barbette Reichmann Other Clinician: Referring Provider: Treating Provider/Extender: Adolph Pollack Weeks in Treatment: 80 Verbal / Phone Orders: No Diagnosis Coding ICD-10 Coding Code  Description [phone] Non-pressure chronic ulcer of other part of left lower leg with fat layer exposed I87.312 Chronic venous hypertension (idiopathic) with ulcer of left lower extremity I89.0 Lymphedema, not elsewhere classified E11.622 Type 2 diabetes mellitus with other skin ulcer I50.32 Chronic diastolic (congestive) heart failure L89.320 Pressure ulcer of left buttock, unstageable L89.610 Pressure ulcer of right heel, unstageable  J47.829F Unspecified open wound, left foot, initial encounter M86.172 Other acute osteomyelitis, left ankle and foot L89.210 Pressure ulcer of right hip, unstageable L89.513 Pressure ulcer of right ankle, stage 3 L89.620 Pressure ulcer of left heel, unstageable Follow-up Appointments Return Appointment in 1 week. Bathing/ [employer] wounds with antibacterial soap and water. No tub bath. Anesthetic (Use 'Patient Medications' Section for Anesthetic Order Entry) Lidocaine applied to wound bed Off-Loading Other: - wear prevalon heel boots at all times Wound Treatment Wound #11 - Trochanter Wound Laterality: Right Cleanser: Soap and Water 1 x Per Day/30 Days Discharge Instructions: Gently cleanse wound with antibacterial soap, rinse and pat dry prior to dressing wounds Cleanser: Vashe 5.8 (oz) 1 x Per Day/30 Days Discharge Instructions: Use vashe 5.8 (oz) as directed Prim Dressing: Gauze 1 x Per Day/30 Days ary Discharge Instructions: Vashe wet to dry Wound #12 - Ankle Wound Laterality: Right, Medial Cleanser: Soap and Water 1 x Per Day/30 Days Discharge Instructions: Gently cleanse wound with antibacterial soap, rinse and pat dry prior to dressing wounds Cleanser: Vashe 5.8 (oz) 1 x Per Day/30 Days Discharge Instructions: Use vashe 5.8 (oz) as directed Prim Dressing: Honey: Activon Honey Gel, 25 (g) Tube ary 1 x Per Day/30 Days Secondary Dressing: Gauze 1 x Per Day/30 Days Discharge Instructions: As directed: dry, moistened with saline or  moistened with Dakins Solution Secondary Dressing: Hydrofera Blue Ready Transfer Foam, 2.5x2.5 (in/in) 1 x Per Day/30 Days Discharge Instructions: Apply to wound bed over non-stick dressing. Secured With: Medipore T - 78M Medipore H Soft Cloth Surgical T ape ape, 2x2 (in/yd) (Dispense As Written) 1 x Per Day/30 Days [name], [name] ([id number]) [id number].pdf Page 6 of 12 Wound #13 - Calcaneus Wound Laterality: Left Cleanser: Soap and Water 1 x Per Day/30 Days Discharge Instructions: Gently cleanse wound with antibacterial soap, rinse and pat dry prior to dressing wounds Cleanser: Vashe 5.8 (oz) 1 x Per Day/30 Days Discharge Instructions: Use vashe 5.8 (oz) as directed Prim Dressing: Honey: Activon Honey Gel, 25 (g) Tube ary 1 x Per Day/30 Days Secondary Dressing: Gauze 1 x Per Day/30 Days Discharge Instructions: As directed: dry, moistened with saline or moistened with Dakins Solution Secondary Dressing: Hydrofera Blue Ready Transfer Foam, 2.5x2.5 (in/in) 1 x Per Day/30 Days Discharge Instructions: Apply to wound bed over non-stick dressing. Secured With: Medipore T - 78M Medipore H Soft Cloth Surgical T ape ape, 2x2 (in/yd) (Dispense As Written) 1 x Per Day/30 Days Secured With: [employer] Sterile or Non-Sterile 6-ply 4.5x4 (yd/yd) (Dispense As Written) 1 x Per Day/30 Days Discharge Instructions: Apply Kerlix as directed Wound #6 - Calcaneus Wound Laterality: Right Cleanser: [employer] Ancillary Kit - 15 Day Supply (Generic) 3 x Per Week/30 Days Discharge Instructions: Use supplies as instructed; Kit contains: (15) Saline Bullets; (15) 3x3 Gauze; 15 pr Gloves Cleanser: Soap and Water 3 x Per Week/30 Days Discharge Instructions: Gently cleanse wound with antibacterial soap, rinse and pat dry prior to dressing wounds Cleanser: Vashe 5.8 (oz) 3 x Per Week/30 Days Discharge Instructions: Use vashe 5.8 (oz) as directed Topical: Activon Honey Gel, 25 (g) Tube (Dispense  As Written) 3 x Per Week/30 Days Prim Dressing: Hydrofera Blue Ready Transfer Foam, 2.5x2.5 (in/in) 3 x Per Week/30 Days ary Discharge Instructions: Apply Hydrofera Blue Ready to wound bed as directed Secondary Dressing: ABD Pad 5x9 (in/in) 3 x Per Week/30 Days Discharge Instructions: Cover with ABD pad Secondary Dressing: Kerlix 4.5 x 4.1 (in/yd) (Dispense As Written) 3 x Per Week/30  Days Discharge Instructions: Apply Kerlix 4.5 x 4.1 (in/yd) as instructed Secured With: Medipore T - 67M Medipore H Soft Cloth Surgical T ape ape, 2x2 (in/yd) 3 x Per Week/30 Days Wound #8 - Sacrum Wound Laterality: Midline Cleanser: Soap and Water 1 x Per Day/30 Days Discharge Instructions: Gently cleanse wound with antibacterial soap, rinse and pat dry prior to dressing wounds Cleanser: Vashe 5.8 (oz) 1 x Per Day/30 Days Discharge Instructions: Use vashe 5.8 (oz) as directed Prim Dressing: Gauze 1 x Per Day/30 Days ary Discharge Instructions: moistened with Vashe Solution for wet to dry dressing Secondary Dressing: (BORDER) Zetuvit Plus SILICONE BORDER Dressing 5x5 (in/in) (Dispense As Written) 1 x Per Day/30 Days Discharge Instructions: Please do not put silicone bordered dressings under wraps. Use non-bordered dressing only. Electronic Signature(s) Signed: 02/27/2023 4:11:08 PM By: Geralyn Corwin DO Previous Signature: 02/27/2023 2:17:41 PM Version By: Geralyn Corwin DO Previous Signature: 02/27/2023 2:56:04 PM Version By: Bonnell Public Entered By: Geralyn Corwin on 02/27/2023 16:06:38 -------------------------------------------------------------------------------- Problem List Details Patient Name: Date of Service: [facility], [name] 02/27/2023 1:00 PM Medical Record Number: [id number] Patient Account Number: [id number] Date of Birth/Sex: Treating RN: 25-Mar-1941 (82 y.o. [name], [name] Primary Care Provider: Barbette Reichmann Other Clinician: Referring Provider: Treating Provider/Extender:  Adolph Pollack Weeks in Treatment: [address] Hadas [name] ([id number]) [id number] Page 7 of 12 Active Problems ICD-10 Encounter Code Description Active Date MDM Diagnosis L97.822 Non-pressure chronic ulcer of other part of left lower leg with fat layer 09/26/2022 No Yes exposed I87.312 Chronic venous hypertension (idiopathic) with ulcer of left lower extremity 09/26/2022 No Yes I89.0 Lymphedema, not elsewhere classified 09/26/2022 No Yes E11.622 Type 2 diabetes mellitus with other skin ulcer 09/26/2022 No Yes I50.32 Chronic diastolic (congestive) heart failure 09/26/2022 No Yes L89.320 Pressure ulcer of left buttock, unstageable 09/26/2022 No Yes L89.610 Pressure ulcer of right heel, unstageable 09/26/2022 No Yes S91.302A Unspecified open wound, left foot, initial encounter 10/24/2022 No Yes M86.172 Other acute osteomyelitis, left ankle and foot 10/24/2022 No Yes L89.210 Pressure ulcer of right hip, unstageable 11/21/2022 No Yes L89.513 Pressure ulcer of right ankle, stage 3 12/05/2022 No Yes L89.620 Pressure ulcer of left heel, unstageable 12/05/2022 No Yes Inactive Problems Resolved Problems Electronic Signature(s) Signed: 02/27/2023 2:17:41 PM By: Geralyn Corwin DO Entered By: Geralyn Corwin on 02/27/2023 13:48:47 -------------------------------------------------------------------------------- Progress Note Details Patient Name: Date of Service: [facility], [name] 02/27/2023 1:00 PM Medical Record Number: [id number] Patient Account Number: [id number] Date of Birth/Sex: Treating RN: 07/01/1941 (82 y.o. [name], [name] Primary Care Provider: Barbette Reichmann Other Clinician: Referring Provider: Treating Provider/Extender: Adolph Pollack [place], [name] ([id number]) [id number] Page 8 of 12 Weeks in Treatment: 22 Subjective Chief Complaint Information obtained from  Patient 09/26/2022; left lower extremity wound, right heel wound and left buttocks wound History of Present Illness (HPI) 82 year old patient who comes with a referral for bilateral lower extremity edema and a lower extremity ulceration and has been sent by her PCP [name]. I understand the patient was recently put on amoxicillin and doxycycline but could not tolerate the amoxicillin. doxycycline course was completed. a BNP and EKG was supposed to be normal and the patient did not have any dyspnea. the patient has been on a diuretic. The patient was also prescribed a pair of elastic compression stockings of the 20-30 mmHg pressure variety. x-ray of the right ankle was done on 09/20/2015 and showed posttraumatic and postsurgical changes of the right ankle with secondary degenerative  changes of the tibiotalar joint and to a lesser degree the subtalar joint. No definite acute bony abnormalities are noted. Past medical history significant for diabetes mellitus, hypertension, hyperlipidemia, right breast cancer treated with a mastectomy in 2014. She has never smoked. 10/24/2015 -- she had delayed her vascular test because of her husband surgery but she is now ready to get him taken care of. He is also unable to use compression stockings and hence we will need to order her Juzo wraps. 10/31/2015-- was seen by Dr. Wyn Quaker on 10/28/2015. She had a left lower extremity arterial duplex done at his office a couple of years ago and that was essentially normal. Today they performed a venous duplex which revealed no evidence of deep vein thrombosis, superficial thrombophlebitis, no venous reflex was seen on the right and a minimal amount of reflux was seen on the left great saphenous vein but no significant reflux was seen. Impression was that there was a component of lymphedema present from a previous surgery and he would recommend compression stockings and leg elevation. Readmission: 07-24-2022 upon  evaluation today patient presents for initial inspection here in our clinic concerning issues that she has been having with her legs this is actually been going on for several years according to what her family member with her today tells me as well as what the patient reiterates as well. She is currently most recently been seeing Dr. Ether Griffins and subsequently he had her in [place] boots. However 2 weeks ago he referred her to [place] and then subsequently took her out of the [place] boot wraps at that point. At this time the left leg looks to be worse in the right leg currently. She is on Lasix and lisinopril with hydrochlorothiazide she has high blood pressure she also has issues currently with lower extremity swelling and edema which has been an ongoing issue for her as well. Patient does have a history of chronic venous hypertension, lymphedema, diabetes mellitus type 2, hypertension, peripheral vascular disease, and neuropathy. Currently she is on Lasix as well as lisinopril with hydrochlorothiazide. 08-02-2022 upon evaluation today patient presents for reevaluation the good news is she is actually doing somewhat better in regard to the wound and the overall appearance and sinuses. The unfortunate thing is her infection really is not significantly improved we did have to switch out her antibiotic once we got that final result back and I switched her to Levaquin and away from the doxycycline. Unfortunately the doxycycline had been doing poorly for her. In fact she had had diarrhea from the time she started taking it on Friday and she is still having it when she shows up today for evaluation. Again I was not aware of this and obviously she does appear to be somewhat dehydrated as well based on what I see. My concern which I discussed with the patient today is the possibility of a C. difficile infection. With that being said fact this started immediately upon taking the doxycycline makes me think that it was just  the medicine and is not completely out of her system yet despite having taken the last dose Tuesday morning. Nonetheless with what we are seeing currently I want to be sure that reason I Minna contact her primary care provider and see if a would be willing to see her and test for C. difficile infection. 08-07-2022 upon evaluation today patient appears to be doing well currently in regard to her wounds which are actually measuring much better this is great news.  Fortunately I do not see any signs of active infection locally or systemically at this time which is great as well. The good news is she was tested for a C. difficile infection and it was negative. I am very pleased and thankful for primary care provider for doing that so this means that she was just having a severe reaction to the doxycycline we have added that to her allergy list at this point. 08-14-2022 upon evaluation today patient appears to be doing well currently in regard to her dehydration she is actually significantly improved compared to last time I saw her last week. With that being said I do not see any evidence of active infection systemically at this time which is great news. However locally she still does appear to have cellulitis in left lower extremity. I discussed with her today that I do believe she would benefit from going ahead and starting the Levaquin. Again we will concerned about the C. difficile infection of the diarrhea but that this turned out to be just an issue with a reaction to the doxycycline. My hope is that the Levaquin will not cause her any complication and will be able to treat the infection. I did review her arterial study as well which was dated on 07-31-2022. It showed that she had a ABI on the right of 1.30 and on the left of 1.27 with a TBI on the right of 1.12 and the left 1.21 this is a normal arterial study. Again on the patient's wound culture she actually did show evidence of Proteus, Morganella, and  MRSA. Levaquin is a good option here across the board. 09/26/2022 [name] is a 82 year old female with a past medical history of type 2 diabetes currently controlled on oral agents, venous insufficiency/lymphedema, right breast cancer and chronic diastolic heart failure that presents to the clinic for a 1 month history of nonhealing wounds to the left lower extremity, right heel and left buttocks. She states that the buttocks wound and the right heel wound developed while she was in the hospital. She was admitted on 08/22/2022 for severe sepsis secondary to acute sigmoid and distal colonic diverticulitis. At that time it was noted she had a stage I decubitus ulcer to her bilateral buttocks. A wound to the heel was not mentioned. She currently denies systemic signs of infection. She came into clinic in a blue gown with no undergarments. She has not been dressing the wounds. She states she just recently obtained home health and they are coming out for the first time this week. She is seen in our clinic often for lower extremity wounds secondary to venous insufficiency. 11/22; patient presents for follow-up. Son is present during the encounter. Per son it sounds like they are not doing any dressing changes. She states she has home health but they have not come out. She is going to let us know which home health agency she has been approved for so we can send orders. Currently she denies signs of infection. 12/13; patient presents for follow-up. Patient has home health and they are coming out once a week. It appears that there has not been any dressing changes except for with home health. Patient has a newly discovered wound to the left foot. There is exposed bone. There is slight erythema and increased warmth to the surrounding tissue. Patient is completely unaware of this wound. 12/20; patient presents for follow-up. She has been taking her oral antibiotics. She now has an eschar and wound  to the right hip. She has been using Dakin's wet-to-dry dressings to the left foot wound and buttocks wound. She has been using Medihoney and silver alginate to the right heel wound. She currently denies systemic signs of infection. She is mainly bedbound or in a wheelchair. 1/10; patient presents for follow-up. Patient had her left second toenail removed by Dr. Ether Griffins, podiatry on 12/21. Unfortunately she did not feel well and she was admitted to the hospital for sepsis. Her right heel wound was thought to be infected and this was debrided in the OR by Dr. Ether Griffins. Culture and bone biopsy had no growth. She has been using Medihoney to all the wound beds except for the lefty buttocks wound for which she uses Dakin's wet-to-dry dressings. She has developed a pressure injury to the left heel now. This is despite having Prevalon boots. Although she is not wearing them today. She has completed 4 weeks of Augmentin and also a week of doxycycline for osteomyelitis of the left foot. Her left foot wound no longer probes to bone. Currently she denies signs of infection. 1/24; patient presents for follow-up. She has been using Medihoney and Hydrofera Blue to the wound beds except for this buttocks wound she has been using [facility], Kamyiah [name] ([id number]) [phone].pdf Page 9 of 12 Dakin's wet-to-dry dressings. She has developed 2 new wounds 1 to the left heel and the other to the right medial ankle. She claims she is wearing her Prevalon boots all the time although she does not have them on today. She currently denies signs of infection. 2/21; patient presents for follow-up. She was recently hospitalized on 12/19/2022 for sepsis secondary to Enterococcus bacillus bacteremia. She was treated with IV antibiotics and has completed her discharge oral antibiotics. She had a CT scanning of the abdomen/pelvis without acute abnormalities. Husband is present with patient today. They have been using  Dakin's wet-to-dry dressings to the sacral wound and hip wound and Hydrofera Blue and Medihoney to the feet wounds. They are not changing the dressings daily. It is unclear how often they actually change the dressings. She has been wearing her Prevalon boots at night but not during the day. She currently denies systemic signs of infection. 3/20; patient presents for follow-up. She again was in the hospital for severe sepsis on 2/24; MRIs at that time showed findings suspicious for osteomyelitis to the right hip. She is currently on Augmentin to complete 4 weeks of this. She is following with ID. She has been using Dakin's wet-to-dry dressings to the sacrum and right hip however the solution is starting to cause discomfort. T the rest of the wound she has been using Hydrofera Blue and Medihoney. Patient o and son are not interested in doing palliative care or hospice. They state that they had discussions while in the hospital. 4/17; patient presents for follow-up. She has been using Vashe wet-to-dry dressings to the sacrum and right trochanter wound. She has been using Medihoney and Hydrofera Blue to the bilateral feet wounds. She has no issues or complaints today. She denies signs of infection. Objective Constitutional Vitals Time Taken: 1:07 PM, Weight: 116 lbs, Temperature: 97.8 F, Pulse: 81 bpm, Respiratory Rate: 18 breaths/min, Blood Pressure: 106/67 mmHg. General Notes: T the right heel there is an open wound with granulation tissue and non viable tissue. T the medial malleolus there is an open wound with o o granulation tissue and tightly adhered nonviable tissue. T the left buttocks there is an open wound with granulation tissue. T  the left foot first metatarsal there o o is epithelialization to the previous wound site. T the left heel there is an open wound with granulation tissue and slough. T the right hip there is a wound with o o tendon and granulation tissue, No signs of overt acute  infection. Integumentary (Hair, Skin) Wound #10 status is Healed - Epithelialized. Original cause of wound was Pressure Injury. The date acquired was: 10/23/2022. The wound has been in treatment 18 weeks. The wound is located on the Left Metatarsal head first. The wound measures 0cm length x 0cm width x 0cm depth; 0cm^2 area and 0cm^3 volume. There is a none present amount of drainage noted. The wound margin is flat and intact. There is no granulation within the wound bed. There is no necrotic tissue within the wound bed. Wound #11 status is Open. Original cause of wound was Pressure Injury. The date acquired was: 10/24/2022. The wound has been in treatment 17 weeks. The wound is located on the Right Trochanter. The wound measures 4cm length x 5cm width x 1.1cm depth; 15.708cm^2 area and 17.279cm^3 volume. There is muscle, Fat Layer (Subcutaneous Tissue), and fascia exposed. There is undermining starting at 1:00 and ending at 11:00 with a maximum distance of 1.9cm. There is a medium amount of serosanguineous drainage noted. The wound margin is well defined and not attached to the wound base. There is medium (34- 66%) red granulation within the wound bed. There is a medium (34-66%) amount of necrotic tissue within the wound bed including Adherent Slough. Wound #12 status is Open. Original cause of wound was Pressure Injury. The date acquired was: 12/05/2022. The wound has been in treatment 12 weeks. The wound is located on the Right,Medial Ankle. The wound measures 3.4cm length x 3.5cm width x 0.2cm depth; 9.346cm^2 area and 1.869cm^3 volume. There is Fat Layer (Subcutaneous Tissue) exposed. There is no tunneling or undermining noted. There is a medium amount of serosanguineous drainage noted. The wound margin is flat and intact. There is medium (34-66%) red, hyper - granulation within the wound bed. There is a small (1-33%) amount of necrotic tissue within the wound bed including Adherent  Slough. General Notes: palpable bone though thin layer of tissue Wound #13 status is Open. Original cause of wound was Pressure Injury. The date acquired was: 11/28/2022. The wound has been in treatment 12 weeks. The wound is located on the Left Calcaneus. The wound measures 1.5cm length x 3cm width x 0.3cm depth; 3.534cm^2 area and 1.06cm^3 volume. There is Fat Layer (Subcutaneous Tissue) exposed. There is no tunneling or undermining noted. There is a medium amount of serosanguineous drainage noted. The wound margin is flat and intact. There is small (1-33%) pink granulation within the wound bed. There is a large (67-100%) amount of necrotic tissue within the wound bed including Adherent Slough. Wound #6 status is Open. Original cause of wound was Pressure Injury. The date acquired was: 07/23/2022. The wound has been in treatment 22 weeks. The wound is located on the Right Calcaneus. The wound measures 1.5cm length x 2cm width x 0.2cm depth; 2.356cm^2 area and 0.471cm^3 volume. There is Fat Layer (Subcutaneous Tissue) exposed. There is a medium amount of serosanguineous drainage noted. The wound margin is distinct with the outline attached to the wound base. There is small (1-33%) pink granulation within the wound bed. There is a large (67-100%) amount of necrotic tissue within the wound bed including Adherent Slough. Wound #8 status is Open. Original cause of wound was  Pressure Injury. The date acquired was: 07/23/2022. The wound has been in treatment 22 weeks. The wound is located on the Midline Sacrum. The wound measures 0.7cm length x 1cm width x 0.3cm depth; 0.55cm^2 area and 0.165cm^3 volume. There is Fat Layer (Subcutaneous Tissue) exposed. There is a small amount of serosanguineous drainage noted. Foul odor after cleansing was noted. The wound margin is distinct with the outline attached to the wound base. There is medium (34-66%) red granulation within the wound bed. There is a small (1-33%)  amount of necrotic tissue within the wound bed including Adherent Slough. Assessment Active Problems ICD-10 Non-pressure chronic ulcer of other part of left lower leg with fat layer exposed Chronic venous hypertension (idiopathic) with ulcer of left lower extremity Lymphedema, not elsewhere classified Type 2 diabetes mellitus with other skin ulcer Chronic diastolic (congestive) heart failure Pressure ulcer of left buttock, unstageable Pressure ulcer of right heel, unstageable Unspecified open wound, left foot, initial encounter [name], [name] ([id number]) [id number] Page 10 of 12 Other acute osteomyelitis, left ankle and foot Pressure ulcer of right hip, unstageable Pressure ulcer of right ankle, stage 3 Pressure ulcer of left heel, unstageable Patient's wounds have more healthy granulation tissue present today. I debrided nonviable tissue. I recommended continuing the course with Hydrofera Blue and Medihoney to the feet wounds and Vashe wet-to-dry dressings to the sacrum and right hip wounds. Continue aggressive offloading. Prognosis still tenuous due to immobility and poor nutritional status. Follow-up in 4 weeks. Procedures Wound #12 Pre-procedure diagnosis of Wound #12 is a Pressure Ulcer located on the Right,Medial Ankle . There was a Selective/Open Wound Non-Viable Tissue Debridement with a total area of 11.9 sq cm performed by Geralyn Corwin, MD. With the following instrument(s): Curette Material removed includes [facility] after achieving pain control using Lidocaine 2% Topical Gel. No specimens were taken. A time out was conducted at 13:40, prior to the start of the procedure. A Minimum amount of bleeding was controlled with Pressure. The procedure was tolerated well with a pain level of 0 throughout and a pain level of 0 following the procedure. Post Debridement Measurements: 3.4cm length x 3.5cm width x 0.2cm depth; 1.869cm^3 volume. Post  debridement Stage noted as Category/Stage IV. Character of Wound/Ulcer Post Debridement is improved. Post procedure Diagnosis Wound #12: Same as Pre-Procedure Wound #13 Pre-procedure diagnosis of Wound #13 is a Pressure Ulcer located on the Left Calcaneus . There was a Excisional Skin/Subcutaneous Tissue Debridement with a total area of 4.5 sq cm performed by Geralyn Corwin, MD. With the following instrument(s): Curette Material removed includes Subcutaneous Tissue and Slough and after achieving pain control using Lidocaine 2% Topical Gel. No specimens were taken. A time out was conducted at 13:40, prior to the start of the procedure. A Minimum amount of bleeding was controlled with Pressure. The procedure was tolerated well with a pain level of 0 throughout and a pain level of 0 following the procedure. Post Debridement Measurements: 1.5cm length x 3cm width x 0.3cm depth; 1.06cm^3 volume. Post debridement Stage noted as Category/Stage III. Character of Wound/Ulcer Post Debridement is improved. Post procedure Diagnosis Wound #13: Same as Pre-Procedure Wound #6 Pre-procedure diagnosis of Wound #6 is a Pressure Ulcer located on the Right Calcaneus . There was a Selective/Open Wound Non-Viable Tissue Debridement with a total area of 3 sq cm performed by Geralyn Corwin, MD. With the following instrument(s): Curette to remove Non-Viable tissue/material. Material removed includes [facility] after achieving pain control using Lidocaine 2% Topical Gel. No specimens  were taken. A time out was conducted at 13:40, prior to the start of the procedure. A Minimum amount of bleeding was controlled with Pressure. The procedure was tolerated well with a pain level of 0 throughout and a pain level of 0 following the procedure. Post Debridement Measurements: 1.5cm length x 2cm width x 0.2cm depth; 0.471cm^3 volume. Post debridement Stage noted as Category/Stage III. Character of Wound/Ulcer Post Debridement is  improved. Post procedure Diagnosis Wound #6: Same as Pre-Procedure Plan Follow-up Appointments: Return Appointment in 1 week. Bathing/ Shower/ Hygiene: Wash wounds with antibacterial soap and water. No tub bath. Anesthetic (Use 'Patient Medications' Section for Anesthetic Order Entry): Lidocaine applied to wound bed Off-Loading: Other: - wear prevalon heel boots at all times WOUND #11: - Trochanter Wound Laterality: Right Cleanser: Soap and Water 1 x Per Day/30 Days Discharge Instructions: Gently cleanse wound with antibacterial soap, rinse and pat dry prior to dressing wounds Cleanser: Vashe 5.8 (oz) 1 x Per Day/30 Days Discharge Instructions: Use vashe 5.8 (oz) as directed Prim Dressing: Gauze 1 x Per Day/30 Days ary Discharge Instructions: Vashe wet to dry WOUND #12: - Ankle Wound Laterality: Right, Medial Cleanser: Soap and Water 1 x Per Day/30 Days Discharge Instructions: Gently cleanse wound with antibacterial soap, rinse and pat dry prior to dressing wounds Cleanser: Vashe 5.8 (oz) 1 x Per Day/30 Days Discharge Instructions: Use vashe 5.8 (oz) as directed Prim Dressing: Honey: Activon Honey Gel, 25 (g) Tube 1 x Per Day/30 Days ary Secondary Dressing: Gauze 1 x Per Day/30 Days Discharge Instructions: As directed: dry, moistened with saline or moistened with Dakins Solution Secondary Dressing: Hydrofera Blue Ready Transfer Foam, 2.5x2.5 (in/in) 1 x Per Day/30 Days Discharge Instructions: Apply to wound bed over non-stick dressing. Secured With: Medipore T - 66M Medipore H Soft Cloth Surgical T ape ape, 2x2 (in/yd) (Dispense As Written) 1 x Per Day/30 Days WOUND #13: - Calcaneus Wound Laterality: Left Cleanser: Soap and Water 1 x Per Day/30 Days Discharge Instructions: Gently cleanse wound with antibacterial soap, rinse and pat dry prior to dressing wounds Cleanser: Vashe 5.8 (oz) 1 x Per Day/30 Days Discharge Instructions: Use vashe 5.8 (oz) as directed Prim Dressing: Honey:  Activon Honey Gel, 25 (g) Tube 1 x Per Day/30 Days ary Secondary Dressing: Gauze 1 x Per Day/30 Days [name], [name] ([id number]) [id number] Page 11 of 12 Discharge Instructions: As directed: dry, moistened with saline or moistened with Dakins Solution Secondary Dressing: Hydrofera Blue Ready Transfer Foam, 2.5x2.5 (in/in) 1 x Per Day/30 Days Discharge Instructions: Apply to wound bed over non-stick dressing. Secured With: Medipore T - 66M Medipore H Soft Cloth Surgical T ape ape, 2x2 (in/yd) (Dispense As Written) 1 x Per Day/30 Days Secured With: [employer] Sterile or Non-Sterile 6-ply 4.5x4 (yd/yd) (Dispense As Written) 1 x Per Day/30 Days Discharge Instructions: Apply Kerlix as directed WOUND #6: - Calcaneus Wound Laterality: Right Cleanser: Byram Ancillary Kit - 15 Day Supply (Generic) 3 x Per Week/30 Days Discharge Instructions: Use supplies as instructed; Kit contains: (15) Saline Bullets; (15) 3x3 Gauze; 15 pr Gloves Cleanser: Soap and Water 3 x Per Week/30 Days Discharge Instructions: Gently cleanse wound with antibacterial soap, rinse and pat dry prior to dressing wounds Cleanser: Vashe 5.8 (oz) 3 x Per Week/30 Days Discharge Instructions: Use vashe 5.8 (oz) as directed Topical: Activon Honey Gel, 25 (g) Tube (Dispense As Written) 3 x Per Week/30 Days Prim Dressing: Hydrofera Blue Ready Transfer Foam, 2.5x2.5 (in/in) 3 x Per Week/30 Days ary  Discharge Instructions: Apply Hydrofera Blue Ready to wound bed as directed Secondary Dressing: ABD Pad 5x9 (in/in) 3 x Per Week/30 Days Discharge Instructions: Cover with ABD pad Secondary Dressing: Kerlix 4.5 x 4.1 (in/yd) (Dispense As Written) 3 x Per Week/30 Days Discharge Instructions: Apply Kerlix 4.5 x 4.1 (in/yd) as instructed Secured With: Medipore T - 4M Medipore H Soft Cloth Surgical T ape ape, 2x2 (in/yd) 3 x Per Week/30 Days WOUND #8: - Sacrum Wound Laterality: Midline Cleanser: Soap and Water 1 x  Per Day/30 Days Discharge Instructions: Gently cleanse wound with antibacterial soap, rinse and pat dry prior to dressing wounds Cleanser: Vashe 5.8 (oz) 1 x Per Day/30 Days Discharge Instructions: Use vashe 5.8 (oz) as directed Prim Dressing: Gauze 1 x Per Day/30 Days ary Discharge Instructions: moistened with Vashe Solution for wet to dry dressing Secondary Dressing: (BORDER) Zetuvit Plus SILICONE BORDER Dressing 5x5 (in/in) (Dispense As Written) 1 x Per Day/30 Days Discharge Instructions: Please do not put silicone bordered dressings under wraps. Use non-bordered dressing only. 1. In office sharp debridement 2. Hydrofera Blue and Medihoneyoobilateral feet wounds 3. Sacrum and right hipooVashe wet-to-dry dressings 4. Follow-up in 4 weeks 5. Aggressive offloading Electronic Signature(s) Signed: 02/27/2023 4:11:08 PM By: Geralyn Corwin DO Previous Signature: 02/27/2023 2:17:41 PM Version By: Geralyn Corwin DO Entered By: Geralyn Corwin on 02/27/2023 16:06:14 -------------------------------------------------------------------------------- SuperBill Details Patient Name: Date of Service: [facility], [name] 02/27/2023 Medical Record Number: [id number] Patient Account Number: [id number] Date of Birth/Sex: Treating RN: 1941/01/07 (82 y.o. [name], [name] Primary Care Provider: Barbette Reichmann Other Clinician: Referring Provider: Treating Provider/Extender: Adolph Pollack Weeks in Treatment: 22 Diagnosis Coding ICD-10 Codes Code Description [phone] Non-pressure chronic ulcer of other part of left lower leg with fat layer exposed I87.312 Chronic venous hypertension (idiopathic) with ulcer of left lower extremity I89.0 Lymphedema, not elsewhere classified E11.622 Type 2 diabetes mellitus with other skin ulcer I50.32 Chronic diastolic (congestive) heart failure L89.320 Pressure ulcer of left buttock, unstageable L89.610 Pressure ulcer of right heel,  unstageable S91.302A Unspecified open wound, left foot, initial encounter M86.172 Other acute osteomyelitis, left ankle and foot L89.210 Pressure ulcer of right hip, unstageable L89.513 Pressure ulcer of right ankle, stage 3 L89.620 Pressure ulcer of left heel, unstageable Facility Procedures : [facility] CPT4 Code: [id number] [name], [name] ([id number]) Description: [phone] - DEBRIDE WOUND 1ST 20 SQ CM OR < [id number] ICD-10 Diagnosis Description L89.513 Pressure ulcer of right ankle, stage 3 L89.610 Pressure ulcer of right heel, unstageable Modifier: [id number]. Quantity: 1 pdf Page 12 of 12 : CPT4 Code: [id number] 1 Description: 1042 - DEB SUBQ TISSUE 20 SQ CM/< ICD-10 Diagnosis Description L89.620 Pressure ulcer of left heel, unstageable Modifier: 1 Quantity: Physician Procedures : CPT4 Code Description Modifier [id number] - WC PHYS LEVEL 3 - EST PT ICD-10 Diagnosis Description L89.320 Pressure ulcer of left buttock, unstageable L89.210 Pressure ulcer of right hip, unstageable Quantity: 1 : [id number] - WC PHYS DEBR WO ANESTH 20 SQ CM ICD-10 Diagnosis Description L89.513 Pressure ulcer of right ankle, stage 3 L89.610 Pressure ulcer of right heel, unstageable Quantity: 1 : [id number] 11042 - WC PHYS SUBQ TISS 20 SQ CM ICD-10 Diagnosis Description L89.620 Pressure ulcer of left heel, unstageable Quantity: 1 Electronic Signature(s) Signed: 02/27/2023 4:11:08 PM By: Geralyn Corwin DO Previous Signature: 02/27/2023 2:17:41 PM Version By: Geralyn Corwin DO Entered By: Geralyn Corwin on 02/27/2023 16:06:26

## 2023-03-13 ENCOUNTER — Encounter: Payer: PPO | Attending: Internal Medicine | Admitting: Internal Medicine

## 2023-03-13 DIAGNOSIS — Z7984 Long term (current) use of oral hypoglycemic drugs: Secondary | ICD-10-CM | POA: Diagnosis not present

## 2023-03-13 DIAGNOSIS — E11622 Type 2 diabetes mellitus with other skin ulcer: Secondary | ICD-10-CM | POA: Diagnosis not present

## 2023-03-13 DIAGNOSIS — X58XXXA Exposure to other specified factors, initial encounter: Secondary | ICD-10-CM | POA: Diagnosis not present

## 2023-03-13 DIAGNOSIS — Z993 Dependence on wheelchair: Secondary | ICD-10-CM | POA: Diagnosis not present

## 2023-03-13 DIAGNOSIS — L89513 Pressure ulcer of right ankle, stage 3: Secondary | ICD-10-CM | POA: Insufficient documentation

## 2023-03-13 DIAGNOSIS — I110 Hypertensive heart disease with heart failure: Secondary | ICD-10-CM | POA: Diagnosis not present

## 2023-03-13 DIAGNOSIS — L89320 Pressure ulcer of left buttock, unstageable: Secondary | ICD-10-CM | POA: Diagnosis not present

## 2023-03-13 DIAGNOSIS — L89610 Pressure ulcer of right heel, unstageable: Secondary | ICD-10-CM | POA: Diagnosis not present

## 2023-03-13 DIAGNOSIS — S91302A Unspecified open wound, left foot, initial encounter: Secondary | ICD-10-CM | POA: Diagnosis not present

## 2023-03-13 DIAGNOSIS — L97812 Non-pressure chronic ulcer of other part of right lower leg with fat layer exposed: Secondary | ICD-10-CM | POA: Diagnosis not present

## 2023-03-13 DIAGNOSIS — I87312 Chronic venous hypertension (idiopathic) with ulcer of left lower extremity: Secondary | ICD-10-CM | POA: Diagnosis not present

## 2023-03-13 DIAGNOSIS — L97822 Non-pressure chronic ulcer of other part of left lower leg with fat layer exposed: Secondary | ICD-10-CM | POA: Insufficient documentation

## 2023-03-13 DIAGNOSIS — L89620 Pressure ulcer of left heel, unstageable: Secondary | ICD-10-CM | POA: Insufficient documentation

## 2023-03-13 DIAGNOSIS — Z853 Personal history of malignant neoplasm of breast: Secondary | ICD-10-CM | POA: Diagnosis not present

## 2023-03-13 DIAGNOSIS — I890 Lymphedema, not elsewhere classified: Secondary | ICD-10-CM | POA: Insufficient documentation

## 2023-03-13 DIAGNOSIS — Z7401 Bed confinement status: Secondary | ICD-10-CM | POA: Insufficient documentation

## 2023-03-13 DIAGNOSIS — E1140 Type 2 diabetes mellitus with diabetic neuropathy, unspecified: Secondary | ICD-10-CM | POA: Insufficient documentation

## 2023-03-13 DIAGNOSIS — L89210 Pressure ulcer of right hip, unstageable: Secondary | ICD-10-CM | POA: Diagnosis not present

## 2023-03-13 DIAGNOSIS — I5032 Chronic diastolic (congestive) heart failure: Secondary | ICD-10-CM | POA: Diagnosis not present

## 2023-03-13 DIAGNOSIS — M86172 Other acute osteomyelitis, left ankle and foot: Secondary | ICD-10-CM | POA: Diagnosis not present

## 2023-03-26 NOTE — Progress Notes (Deleted)
Cath Change/ Replacement  Patient is present today for a catheter change due to urinary retention.  ***ml of water was removed from the balloon, a 16 FR Silastic foley cath was removed without difficulty.  Patient was cleaned and prepped in a sterile fashion with betadine.  A 16 FR Silastic foley cath was replaced into the bladder, {dnt complications:20057}. Urine return was noted ***ml and urine was *** in color. The balloon was filled with 10ml of sterile water. A *** bag was attached for drainage.  A night bag was also given to the patient and patient was given instruction on how to change from one bag to another. Patient was given proper instruction on catheter care.    Performed by: ***  Follow up: No follow-ups on file.

## 2023-03-27 ENCOUNTER — Ambulatory Visit: Payer: PPO | Admitting: Urology

## 2023-03-27 ENCOUNTER — Ambulatory Visit: Payer: PPO | Admitting: Internal Medicine

## 2023-03-27 DIAGNOSIS — R339 Retention of urine, unspecified: Secondary | ICD-10-CM

## 2023-03-28 ENCOUNTER — Encounter: Payer: Self-pay | Admitting: Infectious Diseases

## 2023-03-28 ENCOUNTER — Ambulatory Visit: Payer: PPO | Attending: Infectious Diseases | Admitting: Infectious Diseases

## 2023-03-28 VITALS — BP 93/62 | Temp 96.8°F

## 2023-03-28 DIAGNOSIS — L8990 Pressure ulcer of unspecified site, unspecified stage: Secondary | ICD-10-CM

## 2023-03-28 DIAGNOSIS — D649 Anemia, unspecified: Secondary | ICD-10-CM | POA: Diagnosis not present

## 2023-03-28 DIAGNOSIS — Z86718 Personal history of other venous thrombosis and embolism: Secondary | ICD-10-CM | POA: Insufficient documentation

## 2023-03-28 DIAGNOSIS — L89629 Pressure ulcer of left heel, unspecified stage: Secondary | ICD-10-CM | POA: Insufficient documentation

## 2023-03-28 DIAGNOSIS — E46 Unspecified protein-calorie malnutrition: Secondary | ICD-10-CM | POA: Insufficient documentation

## 2023-03-28 DIAGNOSIS — L89219 Pressure ulcer of right hip, unspecified stage: Secondary | ICD-10-CM | POA: Diagnosis not present

## 2023-03-28 DIAGNOSIS — L89159 Pressure ulcer of sacral region, unspecified stage: Secondary | ICD-10-CM

## 2023-03-28 DIAGNOSIS — L89619 Pressure ulcer of right heel, unspecified stage: Secondary | ICD-10-CM | POA: Diagnosis not present

## 2023-03-28 DIAGNOSIS — R339 Retention of urine, unspecified: Secondary | ICD-10-CM | POA: Insufficient documentation

## 2023-03-28 DIAGNOSIS — L89620 Pressure ulcer of left heel, unstageable: Secondary | ICD-10-CM

## 2023-03-28 DIAGNOSIS — Z853 Personal history of malignant neoplasm of breast: Secondary | ICD-10-CM | POA: Insufficient documentation

## 2023-03-28 DIAGNOSIS — E8809 Other disorders of plasma-protein metabolism, not elsewhere classified: Secondary | ICD-10-CM | POA: Insufficient documentation

## 2023-03-28 DIAGNOSIS — L89610 Pressure ulcer of right heel, unstageable: Secondary | ICD-10-CM | POA: Diagnosis not present

## 2023-03-28 NOTE — Progress Notes (Signed)
NAME: [name]  DOB: 1941-09-05  MRN: [id number]  Date/Time: 03/28/2023 10:26 AM   Subjective:   Pt is here with her son for follow up of multiple pressure wounds She was in [facility] between 01/05/23-01/15/23 She completed 4 weeks of augmentin She is followed at the wound clinic and saw them in April Next appt in 2 weeks [name] is a 82 y.o. 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 and IVC filter placement 11/06/22,  ,She has cystocele and h/o urinary retention , treated enterococcus bacteremia in feb 2024 and readmitted in feb for multiple pressure wounds especially the one on the rt hip She is feeling better now Son is taking care of the wounds She has lost lot of weight  She is mostly in wheel chair  Past Medical History:  Diagnosis Date   Arthritis    Arthritis    Breast cancer (HCC) 1992   right breast with lumpectomy and rad tx   Cancer of right breast (HCC) 04/16/2013   right breast with mastectomy   Diabetes mellitus without complication (HCC)    Gout    High cholesterol    Hyperlipidemia    Hypertension    Personal history of radiation therapy     Past Surgical History:  Procedure Laterality Date   ABDOMINAL HYSTERECTOMY     ANKLE FRACTURE SURGERY Right 2004   BREAST LUMPECTOMY Right 1992   positive   IRRIGATION AND DEBRIDEMENT FOOT Right 11/02/2022   Procedure: IRRIGATION AND DEBRIDEMENT RIGHT FOOT AND BONE BIOPSY RIGHT FOOT;  Surgeon: Gwyneth Revels, DPM;  Location: [facility] ORS;  Service: Podiatry;  Laterality: Right;   IVC FILTER INSERTION N/A 11/06/2022   Procedure: IVC FILTER INSERTION;  Surgeon: Annice Needy, MD;  Location: [facility] INVASIVE CV LAB;  Service: Cardiovascular;  Laterality: N/A;   MASTECTOMY Right 2014   PERIPHERAL VASCULAR THROMBECTOMY Left 11/06/2022    Procedure: PERIPHERAL VASCULAR THROMBECTOMY- EXTERNAL Iliac Vein/CFV;  Surgeon: Annice Needy, MD;  Location: [facility] INVASIVE CV LAB;  Service: Cardiovascular;  Laterality: Left;   SHOULDER SURGERY Right 2010    Social History   Socioeconomic History   Marital status: Married    Spouse name: Not on file   Number of children: 2   Years of education: Not on file   Highest education level: Not on file  Occupational History   Not on file  Tobacco Use   Smoking status: Never    Passive exposure: Never   Smokeless tobacco: Never  Vaping Use   Vaping Use: Never used  Substance and Sexual Activity   Alcohol use: No    Alcohol/week: 0.0 standard drinks of alcohol   Drug use: No   Sexual activity: Not Currently  Other Topics Concern   Not on file  Social History Narrative   Not on file   Social Determinants of Health   Financial Resource Strain: Not on file  Food Insecurity: No Food Insecurity (01/14/2023)   Hunger Vital Sign    Worried About Running Out of Food in the Last Year: Never true    Ran Out of Food in the Last Year: Never true  Transportation Needs: No Transportation Needs (01/14/2023)   PRAPARE - Administrator, Civil Service (Medical): No    Lack of Transportation (Non-Medical): No  Physical Activity: Not on  file  Stress: Not on file  Social Connections: Not on file  Intimate Partner Violence: Not At Risk (01/14/2023)   Humiliation, Afraid, Rape, and Kick questionnaire    Fear of Current or Ex-Partner: No    Emotionally Abused: No    Physically Abused: No    Sexually Abused: No    Family History  Problem Relation Age of Onset   Diabetes Father    Breast cancer Neg Hx    Allergies  Allergen Reactions   Other Anaphylaxis    Anesthisia has been a health issue for her in the past.    Sulfa Antibiotics Rash   I? Current Outpatient Medications  Medication Sig Dispense Refill   acetaminophen (TYLENOL) 500 MG tablet Take 500-1,000 mg by mouth 4 (four)  times daily as needed for mild pain or moderate pain.     allopurinol (ZYLOPRIM) 300 MG tablet Take 300 mg by mouth daily.  11   aspirin EC 81 MG tablet Take 1 tablet (81 mg total) by mouth daily. Swallow whole. 30 tablet 12   atenolol (TENORMIN) 50 MG tablet Take 50 mg by mouth daily.     atorvastatin (LIPITOR) 40 MG tablet Take 40 mg by mouth daily.      diphenoxylate-atropine (LOMOTIL) 2.5-0.025 MG tablet Take 1-2 tablets by mouth 3 (three) times daily as needed.     furosemide (LASIX) 40 MG tablet Take 1 tablet (40 mg total) by mouth daily as needed for edema or fluid. 30 tablet    gabapentin (NEURONTIN) 100 MG capsule TAKE 1 CAPSULE BY MOUTH ONCE DAILY FOR 7 DAYS THEN TAKE 1 CAPSULES BY MOUTH TWICE DAILY     gentamicin cream (GARAMYCIN) 0.1 % Apply 1 Application topically in the morning and at bedtime.     HYDROcodone-acetaminophen (NORCO) 7.5-325 MG tablet Take 1 tablet by mouth 2 (two) times daily as needed for moderate pain or severe pain.     levothyroxine (SYNTHROID) 88 MCG tablet Take 88 mcg by mouth daily.     lisinopril (ZESTRIL) 5 MG tablet Take 5 mg by mouth daily.     metFORMIN (GLUCOPHAGE) 1000 MG tablet Take 1,000 mg by mouth in the morning and at bedtime.     Multiple Vitamin (MULTIVITAMIN WITH MINERALS) TABS tablet Take 1 tablet by mouth daily.     polyethylene glycol (MIRALAX / GLYCOLAX) 17 g packet Take 17 g by mouth daily. 14 each 0   sitaGLIPtin (JANUVIA) 50 MG tablet Take 50 mg by mouth daily.     sodium hypochlorite (DAKIN'S 1/4 STRENGTH) 0.125 % SOLN moisten gauze FOR wet TO DRY dressings.     vitamin B-12 (CYANOCOBALAMIN) 1000 MCG tablet Take 1,000 mcg by mouth daily.     No current facility-administered medications for this visit.     Abtx:  Anti-infectives (From admission, onward)    None       REVIEW OF SYSTEMS:  Const: negative fever, negative chills, ++weight loss of 40 pounds Eyes: negative diplopia or visual changes, negative eye pain ENT: negative  coryza, negative sore throat Resp: negative cough, hemoptysis, dyspnea Cards: negative for chest pain, palpitations, lower extremity edema GU: negative for frequency, dysuria and hematuria GI: Negative for abdominal pain, diarrhea, bleeding, constipation Skin: negative for rash and pruritus Heme: negative for easy bruising and gum/nose bleeding WU:[id number] weakness Neurolo:dizziness  Psych: negative for feelings of anxiety, depression  Endocrine: diabetes Allergy/Immunology- sulfa Objective:  VITALS:  BP 93/62   Temp (!) 96.8 F (36 C) (Temporal)  PHYSICAL EXAM:  General: Alert, cooperative, no distress, frail, pale and chronically ill Head: Normocephalic, without obvious abnormality, atraumatic. Eyes: Conjunctivae clear, anicteric sclerae. Pupils are equal ENT Nares normal. No drainage or sinus tenderness. Lips, mucosa, and tongue normal. No Thrush Neck:, symmetrical, no adenopathy, thyroid: non tender no carotid bruit and no JVD. Back: small wound  Rt hip wound- samller than before- fibrin   Left heel  Rt leg medial side lower third- surperficial tear       Lungs: Clear to auscultation bilaterally. No Wheezing or Rhonchi. No rales. Heart: Regular rate and rhythm, no murmur, rub or gallop. Abdomen: did not examine as in wheel chair Foley in place Extremities: as above Skin: No rashes or lesions. Or bruising Lymph: Cervical, supraclavicular normal. Neurologic: Grossly non-focal Pertinent Labs 02/12/23 Hb 9.4 PLT 405 Cr 0.7 Alb 3.7  ? Impression/Recommendation ? Multiple pressure ulcers of the  sacrum, right hip at the greater trochanter area, bilateral heels All of them are much better with no obvious infection She ahd taken 4-5 weeks of antibiotic March 2024  She is followed at wound clinic- will need frequent appt Has a new wound rt leg from a scab lifted by her son leading to teat and trauma- superficial and no infection currently Continue topical  care Communicated with wound clinic for her to be sooner than June  Urianry retention has foley- followed by urology- appt tomorrow   Anemia    Malnutrition/hypoalbuminemia improving   H/o ca breast- rt mastectomy  H/o 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 ___________________________________________________ Discussed with patient, and her son Follow PRN Note:  This document was prepared using Dragon voice recognition software and may include unintentional dictation errors.

## 2023-03-28 NOTE — Patient Instructions (Signed)
You are here for follow of multiple wounds which are improving- need to improve your nutrition high protein diet as you have low protein and you are anemic- you need to follow up with wound clinic more frequently than once a month- I have let them know.

## 2023-03-29 ENCOUNTER — Ambulatory Visit (INDEPENDENT_AMBULATORY_CARE_PROVIDER_SITE_OTHER): Payer: PPO | Admitting: Physician Assistant

## 2023-03-29 VITALS — BP 90/57 | HR 80

## 2023-03-29 DIAGNOSIS — B3731 Acute candidiasis of vulva and vagina: Secondary | ICD-10-CM

## 2023-03-29 DIAGNOSIS — N9089 Other specified noninflammatory disorders of vulva and perineum: Secondary | ICD-10-CM | POA: Diagnosis not present

## 2023-03-29 DIAGNOSIS — Z466 Encounter for fitting and adjustment of urinary device: Secondary | ICD-10-CM

## 2023-03-29 DIAGNOSIS — R339 Retention of urine, unspecified: Secondary | ICD-10-CM | POA: Diagnosis not present

## 2023-03-29 MED ORDER — FLUCONAZOLE 150 MG PO TABS: 150.0000 mg | ORAL_TABLET | Freq: Once | ORAL | 0 refills | Status: AC

## 2023-03-29 NOTE — Progress Notes (Signed)
Cath Change/ Replacement  Patient is present today for a catheter change due to urinary retention.  8ml of water was removed from the balloon, a 18 FR foley cath was removed without difficulty.  Patient was cleaned and prepped in a sterile fashion with betadine and 2% lidocaine jelly was instilled into the urethra. A 16 FR foley cath was replaced into the bladder, no complications were noted. Urine return was noted 20 ml and urine was yellow in color. The balloon was filled with 10ml of sterile water. A night bag was attached for drainage. Patient was given proper instruction on catheter care.    Performed by: August Luz, RN   Additional notes: She is 1 week of vulvar itching and irritation.  On exam, she has a red rash extending to the bilateral inner thighs with clear borders and white discharge along the inguinal creases consistent with vulvovaginal candidiasis.  Will treat with Diflucan 150 mg x 1 dose. Carman Ching, PA-C   Follow up: 1 month for catheter change

## 2023-04-10 ENCOUNTER — Ambulatory Visit: Payer: PPO | Admitting: Internal Medicine

## 2023-04-17 ENCOUNTER — Telehealth: Payer: Self-pay

## 2023-04-17 ENCOUNTER — Encounter: Payer: PPO | Attending: Internal Medicine | Admitting: Internal Medicine

## 2023-04-17 DIAGNOSIS — Z7984 Long term (current) use of oral hypoglycemic drugs: Secondary | ICD-10-CM | POA: Insufficient documentation

## 2023-04-17 DIAGNOSIS — I87312 Chronic venous hypertension (idiopathic) with ulcer of left lower extremity: Secondary | ICD-10-CM | POA: Diagnosis not present

## 2023-04-17 DIAGNOSIS — I110 Hypertensive heart disease with heart failure: Secondary | ICD-10-CM | POA: Insufficient documentation

## 2023-04-17 DIAGNOSIS — Z853 Personal history of malignant neoplasm of breast: Secondary | ICD-10-CM | POA: Insufficient documentation

## 2023-04-17 DIAGNOSIS — E1140 Type 2 diabetes mellitus with diabetic neuropathy, unspecified: Secondary | ICD-10-CM | POA: Diagnosis not present

## 2023-04-17 DIAGNOSIS — L89320 Pressure ulcer of left buttock, unstageable: Secondary | ICD-10-CM | POA: Insufficient documentation

## 2023-04-17 DIAGNOSIS — M86172 Other acute osteomyelitis, left ankle and foot: Secondary | ICD-10-CM | POA: Insufficient documentation

## 2023-04-17 DIAGNOSIS — L89610 Pressure ulcer of right heel, unstageable: Secondary | ICD-10-CM | POA: Insufficient documentation

## 2023-04-17 DIAGNOSIS — L89620 Pressure ulcer of left heel, unstageable: Secondary | ICD-10-CM | POA: Insufficient documentation

## 2023-04-17 DIAGNOSIS — L89513 Pressure ulcer of right ankle, stage 3: Secondary | ICD-10-CM | POA: Diagnosis not present

## 2023-04-17 DIAGNOSIS — L97822 Non-pressure chronic ulcer of other part of left lower leg with fat layer exposed: Secondary | ICD-10-CM | POA: Insufficient documentation

## 2023-04-17 DIAGNOSIS — L97812 Non-pressure chronic ulcer of other part of right lower leg with fat layer exposed: Secondary | ICD-10-CM | POA: Insufficient documentation

## 2023-04-17 DIAGNOSIS — Z993 Dependence on wheelchair: Secondary | ICD-10-CM | POA: Insufficient documentation

## 2023-04-17 DIAGNOSIS — L97818 Non-pressure chronic ulcer of other part of right lower leg with other specified severity: Secondary | ICD-10-CM | POA: Diagnosis not present

## 2023-04-17 DIAGNOSIS — S91302A Unspecified open wound, left foot, initial encounter: Secondary | ICD-10-CM | POA: Insufficient documentation

## 2023-04-17 DIAGNOSIS — Z7401 Bed confinement status: Secondary | ICD-10-CM | POA: Diagnosis not present

## 2023-04-17 DIAGNOSIS — I5032 Chronic diastolic (congestive) heart failure: Secondary | ICD-10-CM | POA: Insufficient documentation

## 2023-04-17 DIAGNOSIS — I890 Lymphedema, not elsewhere classified: Secondary | ICD-10-CM | POA: Insufficient documentation

## 2023-04-17 DIAGNOSIS — L89210 Pressure ulcer of right hip, unstageable: Secondary | ICD-10-CM | POA: Insufficient documentation

## 2023-04-17 DIAGNOSIS — L89314 Pressure ulcer of right buttock, stage 4: Secondary | ICD-10-CM | POA: Diagnosis not present

## 2023-04-17 DIAGNOSIS — L89623 Pressure ulcer of left heel, stage 3: Secondary | ICD-10-CM | POA: Diagnosis not present

## 2023-04-17 DIAGNOSIS — E11622 Type 2 diabetes mellitus with other skin ulcer: Secondary | ICD-10-CM | POA: Insufficient documentation

## 2023-04-17 NOTE — Telephone Encounter (Signed)
Pt called after hours nurse line stating that she has had no improvement in vaginal itching since one time dose of fluconazole. Pt requesting advise on next steps. Please advise.

## 2023-04-22 NOTE — Telephone Encounter (Signed)
Spoke with patient and she has gotten some OTC yeast infection cream and that has helped her.

## 2023-04-22 NOTE — Telephone Encounter (Signed)
Ok to send in a second dose. If her symptoms do not resolve, she will need to see GYN.

## 2023-04-26 ENCOUNTER — Ambulatory Visit: Payer: PPO | Admitting: Physician Assistant

## 2023-04-29 NOTE — Progress Notes (Signed)
[name] ([id number]) [id number] Page 1 of 13 Visit Report for 03/13/2023 Chief Complaint Document Details Patient Name: Date of Service: [facility], [name]. 03/13/2023 3:00 PM Medical Record Number: [id number] Patient Account Number: [id number] Date of Birth/Sex: Treating RN: 1940/12/03 (82 y.o. [name] Primary Care Provider: Barbette Reichmann Other Clinician: Referring Provider: Treating Provider/Extender: Adolph Pollack Weeks in Treatment: 24 Information Obtained from: Patient Chief Complaint 09/26/2022; left lower extremity wound, right heel wound and left buttocks wound Electronic Signature(s) Signed: 03/13/2023 3:52:38 PM By: Geralyn Corwin DO Entered By: Geralyn Corwin on 03/13/2023 15:25:21 -------------------------------------------------------------------------------- Debridement Details Patient Name: Date of Service: [facility], [name] 03/13/2023 3:00 PM Medical Record Number: [id number] Patient Account Number: [id number] Date of Birth/Sex: Treating RN: [birth year] (82 y.o. [name] Primary Care Provider: Barbette Reichmann Other Clinician: Referring Provider: Treating Provider/Extender: Adolph Pollack Weeks in Treatment: 24 Debridement Performed for Assessment: Wound #13 Left Calcaneus Performed By: Physician Geralyn Corwin, MD Debridement Type: Debridement Level of Consciousness (Pre-procedure): Awake and Alert Pre-procedure Verification/Time Out Yes - 15:15 Taken: Start Time: 15:15 Percent of Wound Bed Debrided: 100% T Area Debrided (cm): otal 2.2 Tissue and other material debrided: Viable, Non-Viable, Slough, Slough Level: Non-Viable Tissue Debridement Description: Selective/Open Wound Instrument: Curette Bleeding: Minimum Hemostasis Achieved: Pressure End Time: 15:29 Procedural Pain: 0 Post Procedural Pain: 0 Response to Treatment: Procedure was tolerated  well [name], [name] ([id number]) [id number] Page 2 of 13 Level of Consciousness (Post- Awake and Alert procedure): Post Debridement Measurements of Total Wound Length: (cm) 1.4 Stage: Category/Stage III Width: (cm) 2 Depth: (cm) 0.3 Volume: (cm) 0.66 Character of Wound/Ulcer Post Debridement: Requires Further Debridement Post Procedure Diagnosis Same as Pre-procedure Electronic Signature(s) Signed: 03/13/2023 3:52:38 PM By: Geralyn Corwin DO Signed: 03/15/2023 11:36:48 AM By: Yevonne Pax RN Entered By: Yevonne Pax on 03/13/2023 15:20:42 -------------------------------------------------------------------------------- Debridement Details Patient Name: Date of Service: [facility], [name] 03/13/2023 3:00 PM Medical Record Number: [id number] Patient Account Number: [id number] Date of Birth/Sex: Treating RN: 1941/10/14 (82 y.o. [name] Primary Care Provider: Barbette Reichmann Other Clinician: Referring Provider: Treating Provider/Extender: Adolph Pollack Weeks in Treatment: 24 Debridement Performed for Assessment: Wound #12 Right,Medial Ankle Performed By: Physician Geralyn Corwin, MD Debridement Type: Debridement Level of Consciousness (Pre-procedure): Awake and Alert Pre-procedure Verification/Time Out Yes - 15:15 Taken: Start Time: 15:15 Percent of Wound Bed Debrided: 100% T Area Debrided (cm): otal 14.13 Tissue and other material debrided: Viable, Non-Viable, Slough, Slough Level: Non-Viable Tissue Debridement Description: Selective/Open Wound Instrument: Curette Bleeding: Minimum Hemostasis Achieved: Pressure End Time: 15:29 Procedural Pain: 0 Post Procedural Pain: 0 Response to Treatment: Procedure was tolerated well Level of Consciousness (Post- Awake and Alert procedure): Post Debridement Measurements of Total Wound Length: (cm) 4.5 Stage: Category/Stage IV Width: (cm) 4 Depth: (cm) 0.3 Volume:  (cm) 4.241 Character of Wound/Ulcer Post Debridement: Improved Post Procedure Diagnosis Same as Pre-procedure Electronic Signature(s) Signed: 03/13/2023 3:52:38 PM By: [name], [name] ([id number]) [id number] Page 3 of 13 Signed: 03/15/2023 11:36:48 AM By: Yevonne Pax RN Entered By: Yevonne Pax on 03/13/2023 15:21:17 -------------------------------------------------------------------------------- HPI Details Patient Name: Date of Service: [facility], [name] 03/13/2023 3:00 PM Medical Record Number: [id number] Patient Account Number: [id number] Date of Birth/Sex: Treating RN: Apr 27, 1941 (82 y.o. [name] Primary Care Provider: Barbette Reichmann Other Clinician: Referring Provider: Treating Provider/Extender: Adolph Pollack Weeks in Treatment: 24 History of Present Illness HPI Description: 82 year old patient who  comes with a referral for bilateral lower extremity edema and a lower extremity ulceration and has been sent by her PCP [name]. I understand the patient was recently put on amoxicillin and doxycycline but could not tolerate the amoxicillin. doxycycline course was completed. a BNP and EKG was supposed to be normal and the patient did not have any dyspnea. the patient has been on a diuretic. The patient was also prescribed a pair of elastic compression stockings of the 20-30 mmHg pressure variety. x-ray of the right ankle was done on 09/20/2015 and showed posttraumatic and postsurgical changes of the right ankle with secondary degenerative changes of the tibiotalar joint and to a lesser degree the subtalar joint. No definite acute bony abnormalities are noted. Past medical history significant for diabetes mellitus, hypertension, hyperlipidemia, right breast cancer treated with a mastectomy in 2014. She has never smoked. 10/24/2015 -- she had delayed her vascular test because of her husband surgery  but she is now ready to get him taken care of. He is also unable to use compression stockings and hence we will need to order her Juzo wraps. 10/31/2015-- was seen by Dr. Wyn Quaker on 10/28/2015. She had a left lower extremity arterial duplex done at his office a couple of years ago and that was essentially normal. Today they performed a venous duplex which revealed no evidence of deep vein thrombosis, superficial thrombophlebitis, no venous reflex was seen on the right and a minimal amount of reflux was seen on the left great saphenous vein but no significant reflux was seen. Impression was that there was a component of lymphedema present from a previous surgery and he would recommend compression stockings and leg elevation. Readmission: 07-24-2022 upon evaluation today patient presents for initial inspection here in our clinic concerning issues that she has been having with her legs this is actually been going on for several years according to what her family member with her today tells me as well as what the patient reiterates as well. She is currently most recently been seeing Dr. Ether Griffins and subsequently he had her in [place] boots. However 2 weeks ago he referred her to [place] and then subsequently took her out of the [place] boot wraps at that point. At this time the left leg looks to be worse in the right leg currently. She is on Lasix and lisinopril with hydrochlorothiazide she has high blood pressure she also has issues currently with lower extremity swelling and edema which has been an ongoing issue for her as well. Patient does have a history of chronic venous hypertension, lymphedema, diabetes mellitus type 2, hypertension, peripheral vascular disease, and neuropathy. Currently she is on Lasix as well as lisinopril with hydrochlorothiazide. 08-02-2022 upon evaluation today patient presents for reevaluation the good news is she is actually doing somewhat better in regard to the wound and the overall  appearance and sinuses. The unfortunate thing is her infection really is not significantly improved we did have to switch out her antibiotic once we got that final result back and I switched her to Levaquin and away from the doxycycline. Unfortunately the doxycycline had been doing poorly for her. In fact she had had diarrhea from the time she started taking it on Friday and she is still having it when she shows up today for evaluation. Again I was not aware of this and obviously she does appear to be somewhat dehydrated as well based on what I see. My concern which I discussed with the patient today is  the possibility of a C. difficile infection. With that being said fact this started immediately upon taking the doxycycline makes me think that it was just the medicine and is not completely out of her system yet despite having taken the last dose Tuesday morning. Nonetheless with what we are seeing currently I want to be sure that reason I Minna contact her primary care provider and see if a would be willing to see her and test for C. difficile infection. 08-07-2022 upon evaluation today patient appears to be doing well currently in regard to her wounds which are actually measuring much better this is great news. Fortunately I do not see any signs of active infection locally or systemically at this time which is great as well. The good news is she was tested for a C. difficile infection and it was negative. I am very pleased and thankful for primary care provider for doing that so this means that she was just having a severe reaction to the doxycycline we have added that to her allergy list at this point. 08-14-2022 upon evaluation today patient appears to be doing well currently in regard to her dehydration she is actually significantly improved compared to last time I saw her last week. With that being said I do not see any evidence of active infection systemically at this time which is great news.  However locally she still does appear to have cellulitis in left lower extremity. I discussed with her today that I do believe she would benefit from going ahead and starting the Levaquin. Again we will concerned about the C. difficile infection of the diarrhea but that this turned out to be just an issue with a reaction to the doxycycline. My hope is that the Levaquin will not cause her any complication and will be able to treat the infection. I did review her arterial study as well which was dated on 07-31-2022. It showed that she had a ABI on the right of 1.30 and on the left of 1.27 with a TBI on the right of 1.12 and the left 1.21 this is a normal arterial study. Again on the patient's wound culture she actually did show evidence of Proteus, Morganella, and MRSA. Levaquin is a good option here across the board. 09/26/2022 Ms. Diane Nylin is a 82 year old female with a past medical history of type 2 diabetes currently controlled on oral agents, venous insufficiency/lymphedema, right breast cancer and chronic diastolic heart failure that presents to the clinic for a 1 month history of nonhealing wounds to the left lower extremity, right heel and left buttocks. She states that the buttocks wound and the right heel wound developed while she was in the hospital. She was admitted on 08/22/2022 for severe sepsis secondary to acute sigmoid and distal colonic diverticulitis. At that time it was noted she had a stage I decubitus Castronova, Franceen [name] ([id number]) [id number] Page 4 of 13 ulcer to her bilateral buttocks. A wound to the heel was not mentioned. She currently denies systemic signs of infection. She came into clinic in a blue gown with no undergarments. She has not been dressing the wounds. She states she just recently obtained home health and they are coming out for the first time this week. She is seen in our clinic often for lower extremity wounds secondary to  venous insufficiency. 11/22; patient presents for follow-up. Son is present during the encounter. Per son it sounds like they are not doing any dressing changes. She states she has home  health but they have not come out. She is going to let us know which home health agency she has been approved for so we can send orders. Currently she denies signs of infection. 12/13; patient presents for follow-up. Patient has home health and they are coming out once a week. It appears that there has not been any dressing changes except for with home health. Patient has a newly discovered wound to the left foot. There is exposed bone. There is slight erythema and increased warmth to the surrounding tissue. Patient is completely unaware of this wound. 12/20; patient presents for follow-up. She has been taking her oral antibiotics. She now has an eschar and wound to the right hip. She has been using Dakin's wet-to-dry dressings to the left foot wound and buttocks wound. She has been using Medihoney and silver alginate to the right heel wound. She currently denies systemic signs of infection. She is mainly bedbound or in a wheelchair. 1/10; patient presents for follow-up. Patient had her left second toenail removed by Dr. Ether Griffins, podiatry on 12/21. Unfortunately she did not feel well and she was admitted to the hospital for sepsis. Her right heel wound was thought to be infected and this was debrided in the OR by Dr. Ether Griffins. Culture and bone biopsy had no growth. She has been using Medihoney to all the wound beds except for the lefty buttocks wound for which she uses Dakin's wet-to-dry dressings. She has developed a pressure injury to the left heel now. This is despite having Prevalon boots. Although she is not wearing them today. She has completed 4 weeks of Augmentin and also a week of doxycycline for osteomyelitis of the left foot. Her left foot wound no longer probes to bone. Currently she denies signs of  infection. 1/24; patient presents for follow-up. She has been using Medihoney and Hydrofera Blue to the wound beds except for this buttocks wound she has been using Dakin's wet-to-dry dressings. She has developed 2 new wounds 1 to the left heel and the other to the right medial ankle. She claims she is wearing her Prevalon boots all the time although she does not have them on today. She currently denies signs of infection. 2/21; patient presents for follow-up. She was recently hospitalized on 12/19/2022 for sepsis secondary to Enterococcus bacillus bacteremia. She was treated with IV antibiotics and has completed her discharge oral antibiotics. She had a CT scanning of the abdomen/pelvis without acute abnormalities. Husband is present with patient today. They have been using Dakin's wet-to-dry dressings to the sacral wound and hip wound and Hydrofera Blue and Medihoney to the feet wounds. They are not changing the dressings daily. It is unclear how often they actually change the dressings. She has been wearing her Prevalon boots at night but not during the day. She currently denies systemic signs of infection. 3/20; patient presents for follow-up. She again was in the hospital for severe sepsis on 2/24; MRIs at that time showed findings suspicious for osteomyelitis to the right hip. She is currently on Augmentin to complete 4 weeks of this. She is following with ID. She has been using Dakin's wet-to-dry dressings to the sacrum and right hip however the solution is starting to cause discomfort. T the rest of the wound she has been using Hydrofera Blue and Medihoney. Patient o and son are not interested in doing palliative care or hospice. They state that they had discussions while in the hospital. 4/17; patient presents for follow-up. She has been using Vashe  wet-to-dry dressings to the sacrum and right trochanter wound. She has been using Medihoney and Hydrofera Blue to the bilateral feet wounds. She  has no issues or complaints today. She denies signs of infection. 5/1; patient developed a new wound to the right leg. Son was present and states that there was a scab and he removed it creating a wound. He has been keeping the area covered. T the sacral and right trochanter wound they have been using Vashe wet-to-dry dressings and Medihoney and Hydrofera Blue to the o bilateral feet wounds. She denies signs of infection. Electronic Signature(s) Signed: 03/13/2023 3:52:38 PM By: Geralyn Corwin DO Entered By: Geralyn Corwin on 03/13/2023 15:26:21 -------------------------------------------------------------------------------- Physical Exam Details Patient Name: Date of Service: [facility], [name] 03/13/2023 3:00 PM Medical Record Number: [id number] Patient Account Number: [id number] Date of Birth/Sex: Treating RN: 1941/04/30 (82 y.o. [name] Primary Care Provider: Barbette Reichmann Other Clinician: Referring Provider: Treating Provider/Extender: Adolph Pollack Weeks in Treatment: 24 Constitutional . Cardiovascular . Psychiatric . Notes [name], [name] ([id number]) [id number] Page 5 of 13 T the right heel there is an open wound with granulation tissue and scant non viable tissue. T the medial malleolus there is an open wound with granulation o o tissue and nonviable tissue. T the posterior right leg there are 2 areas open with granulation tissue. T the left buttocks there is an open wound with granulation o o tissue and non viable tissue. T the left heel there is an open wound with granulation tissue and non viable tissue. T the right hip there is a wound with tendon o o and granulation tissue, No signs of overt acute infection. Electronic Signature(s) Signed: 03/13/2023 3:52:38 PM By: Geralyn Corwin DO Entered By: Geralyn Corwin on 03/13/2023  15:31:11 -------------------------------------------------------------------------------- Physician Orders Details Patient Name: Date of Service: [facility], [name] 03/13/2023 3:00 PM Medical Record Number: [id number] Patient Account Number: [id number] Date of Birth/Sex: Treating RN: 12-Jul-1941 (82 y.o. [name] Primary Care Provider: Barbette Reichmann Other Clinician: Referring Provider: Treating Provider/Extender: Adolph Pollack Weeks in Treatment: 24 Verbal / Phone Orders: No Diagnosis Coding ICD-10 Coding Code Description [phone] Non-pressure chronic ulcer of other part of left lower leg with fat layer exposed L97.812 Non-pressure chronic ulcer of other part of right lower leg with fat layer exposed I87.312 Chronic venous hypertension (idiopathic) with ulcer of left lower extremity I89.0 Lymphedema, not elsewhere classified E11.622 Type 2 diabetes mellitus with other skin ulcer I50.32 Chronic diastolic (congestive) heart failure L89.320 Pressure ulcer of left buttock, unstageable L89.610 Pressure ulcer of right heel, unstageable S91.302A Unspecified open wound, left foot, initial encounter M86.172 Other acute osteomyelitis, left ankle and foot L89.210 Pressure ulcer of right hip, unstageable L89.513 Pressure ulcer of right ankle, stage 3 L89.620 Pressure ulcer of left heel, unstageable Z79.84 Long term (current) use of oral hypoglycemic drugs Follow-up Appointments Return Appointment in 1 month Bathing/ Shower/ Hygiene Wash wounds with antibacterial soap and water. No tub bath. Anesthetic (Use 'Patient Medications' Section for Anesthetic Order Entry) Lidocaine applied to wound bed Off-Loading Other: - wear prevalon heel boots at all times Wound Treatment Wound #11 - Trochanter Wound Laterality: Right Cleanser: Soap and Water 1 x Per Day/30 Days Discharge Instructions: Gently cleanse wound with antibacterial soap, rinse and pat dry prior to  dressing wounds Cleanser: Vashe 5.8 (oz) 1 x Per Day/30 Days Discharge Instructions: Use vashe 5.8 (oz) as directed TAMAKA, SKIRVIN [name] ([id number]) [id number] Page 6 of 13  Prim Dressing: Gauze 1 x Per Day/30 Days ary Discharge Instructions: Vashe wet to dry Secondary Dressing: ABD Pad 5x9 (in/in) 1 x Per Day/30 Days Discharge Instructions: Cover with ABD pad Secured With: Medipore T - 62M Medipore H Soft Cloth Surgical T ape ape, 2x2 (in/yd) 1 x Per Day/30 Days Wound #12 - Ankle Wound Laterality: Right, Medial Cleanser: Soap and Water 1 x Per Day/30 Days Discharge Instructions: Gently cleanse wound with antibacterial soap, rinse and pat dry prior to dressing wounds Cleanser: Vashe 5.8 (oz) 1 x Per Day/30 Days Discharge Instructions: Use vashe 5.8 (oz) as directed Prim Dressing: Honey: Activon Honey Gel, 25 (g) Tube ary 1 x Per Day/30 Days Secondary Dressing: Hydrofera Blue Ready Transfer Foam, 2.5x2.5 (in/in) 1 x Per Day/30 Days Discharge Instructions: Apply to wound bed over non-stick dressing. Secured With: Medipore T - 62M Medipore H Soft Cloth Surgical T ape ape, 2x2 (in/yd) (Dispense As Written) 1 x Per Day/30 Days Secured With: [employer] Sterile or Non-Sterile 6-ply 4.5x4 (yd/yd) (Dispense As Written) 1 x Per Day/30 Days Discharge Instructions: Apply Kerlix as directed Wound #13 - Calcaneus Wound Laterality: Left Cleanser: Soap and Water 1 x Per Day/30 Days Discharge Instructions: Gently cleanse wound with antibacterial soap, rinse and pat dry prior to dressing wounds Cleanser: Vashe 5.8 (oz) 1 x Per Day/30 Days Discharge Instructions: Use vashe 5.8 (oz) as directed Prim Dressing: Honey: Activon Honey Gel, 25 (g) Tube ary 1 x Per Day/30 Days Secondary Dressing: ABD Pad 5x9 (in/in) 1 x Per Day/30 Days Discharge Instructions: Cover with ABD pad Secondary Dressing: Hydrofera Blue Ready Transfer Foam, 2.5x2.5 (in/in) 1 x Per Day/30 Days Discharge  Instructions: Apply to wound bed over non-stick dressing. Secured With: Medipore T - 62M Medipore H Soft Cloth Surgical T ape ape, 2x2 (in/yd) (Dispense As Written) 1 x Per Day/30 Days Secured With: [employer] Sterile or Non-Sterile 6-ply 4.5x4 (yd/yd) (Dispense As Written) 1 x Per Day/30 Days Discharge Instructions: Apply Kerlix as directed Wound #6 - Calcaneus Wound Laterality: Right Cleanser: [employer] Ancillary Kit - 15 Day Supply (Generic) 3 x Per Week/30 Days Discharge Instructions: Use supplies as instructed; Kit contains: (15) Saline Bullets; (15) 3x3 Gauze; 15 pr Gloves Cleanser: Soap and Water 3 x Per Week/30 Days Discharge Instructions: Gently cleanse wound with antibacterial soap, rinse and pat dry prior to dressing wounds Cleanser: Vashe 5.8 (oz) 3 x Per Week/30 Days Discharge Instructions: Use vashe 5.8 (oz) as directed Topical: Activon Honey Gel, 25 (g) Tube (Dispense As Written) 3 x Per Week/30 Days Prim Dressing: Hydrofera Blue Ready Transfer Foam, 2.5x2.5 (in/in) 3 x Per Week/30 Days ary Discharge Instructions: Apply Hydrofera Blue Ready to wound bed as directed Secondary Dressing: ABD Pad 5x9 (in/in) 3 x Per Week/30 Days Discharge Instructions: Cover with ABD pad Secondary Dressing: Kerlix 4.5 x 4.1 (in/yd) (Dispense As Written) 3 x Per Week/30 Days Discharge Instructions: Apply Kerlix 4.5 x 4.1 (in/yd) as instructed Secured With: Medipore T - 62M Medipore H Soft Cloth Surgical T ape ape, 2x2 (in/yd) 3 x Per Week/30 Days Wound #8 - Sacrum Wound Laterality: Midline Cleanser: Soap and Water 1 x Per Day/30 Days Discharge Instructions: Gently cleanse wound with antibacterial soap, rinse and pat dry prior to dressing wounds Cleanser: Vashe 5.8 (oz) 1 x Per Day/30 Days Discharge Instructions: Use vashe 5.8 (oz) as directed [name], [name] ([id number]) [id number] Page 7 of 13 Prim Dressing: Gauze 1 x Per Day/30 Days ary Discharge Instructions:  moistened with Vashe Solution  for wet to dry dressing Secondary Dressing: (BORDER) Zetuvit Plus SILICONE BORDER Dressing 5x5 (in/in) (Dispense As Written) 1 x Per Day/30 Days Discharge Instructions: Please do not put silicone bordered dressings under wraps. Use non-bordered dressing only. Electronic Signature(s) Signed: 03/13/2023 3:52:38 PM By: Geralyn Corwin DO Signed: 03/15/2023 11:36:48 AM By: Yevonne Pax RN Entered By: Yevonne Pax on 03/13/2023 15:47:14 -------------------------------------------------------------------------------- Problem List Details Patient Name: Date of Service: [facility], [name] 03/13/2023 3:00 PM Medical Record Number: [id number] Patient Account Number: [id number] Date of Birth/Sex: Treating RN: 1940/11/21 (82 y.o. [name] Primary Care Provider: Barbette Reichmann Other Clinician: Referring Provider: Treating Provider/Extender: Adolph Pollack Weeks in Treatment: 24 Active Problems ICD-10 Encounter Code Description Active Date MDM Diagnosis [id number] Non-pressure chronic ulcer of other part of left lower leg with fat layer 09/26/2022 No Yes exposed L97.812 Non-pressure chronic ulcer of other part of right lower leg with fat layer 03/13/2023 No Yes exposed I87.312 Chronic venous hypertension (idiopathic) with ulcer of left lower extremity 09/26/2022 No Yes I89.0 Lymphedema, not elsewhere classified 09/26/2022 No Yes E11.622 Type 2 diabetes mellitus with other skin ulcer 09/26/2022 No Yes I50.32 Chronic diastolic (congestive) heart failure 09/26/2022 No Yes L89.320 Pressure ulcer of left buttock, unstageable 09/26/2022 No Yes L89.610 Pressure ulcer of right heel, unstageable 09/26/2022 No Yes S91.302A Unspecified open wound, left foot, initial encounter 10/24/2022 No Yes [name], [name] ([id number]) [id number] Page 8 of [id number] Other acute osteomyelitis, left ankle and foot 10/24/2022 No  Yes L89.210 Pressure ulcer of right hip, unstageable 11/21/2022 No Yes L89.513 Pressure ulcer of right ankle, stage 3 12/05/2022 No Yes L89.620 Pressure ulcer of left heel, unstageable 12/05/2022 No Yes Z79.84 Long term (current) use of oral hypoglycemic drugs 03/13/2023 No Yes Inactive Problems Resolved Problems Electronic Signature(s) Signed: 03/13/2023 3:52:38 PM By: Geralyn Corwin DO Entered By: Geralyn Corwin on 03/13/2023 15:25:16 -------------------------------------------------------------------------------- Progress Note Details Patient Name: Date of Service: [facility], [name] 03/13/2023 3:00 PM Medical Record Number: [id number] Patient Account Number: [id number] Date of Birth/Sex: Treating RN: [birth year] (82 y.o. [name] Primary Care Provider: Barbette Reichmann Other Clinician: Referring Provider: Treating Provider/Extender: Adolph Pollack Weeks in Treatment: 24 Subjective Chief Complaint Information obtained from Patient 09/26/2022; left lower extremity wound, right heel wound and left buttocks wound History of Present Illness (HPI) 82 year old patient who comes with a referral for bilateral lower extremity edema and a lower extremity ulceration and has been sent by her PCP [name]. I understand the patient was recently put on amoxicillin and doxycycline but could not tolerate the amoxicillin. doxycycline course was completed. a BNP and EKG was supposed to be normal and the patient did not have any dyspnea. the patient has been on a diuretic. The patient was also prescribed a pair of elastic compression stockings of the 20-30 mmHg pressure variety. x-ray of the right ankle was done on 09/20/2015 and showed posttraumatic and postsurgical changes of the right ankle with secondary degenerative changes of the tibiotalar joint and to a lesser degree the subtalar joint. No definite acute bony abnormalities are noted. Past medical history  significant for diabetes mellitus, hypertension, hyperlipidemia, right breast cancer treated with a mastectomy in 2014. She has never smoked. 10/24/2015 -- she had delayed her vascular test because of her husband surgery but she is now ready to get him taken care of. He is also unable to use compression stockings and hence we will need to order her Juzo wraps. 10/31/2015-- was  seen by Dr. Wyn Quaker on 10/28/2015. She had a left lower extremity arterial duplex done at his office a couple of years ago and that was essentially normal. Today they performed a venous duplex which revealed no evidence of deep vein thrombosis, superficial thrombophlebitis, no venous reflex was seen on the right and a minimal amount of reflux was seen on the left great saphenous vein but no significant reflux was seen. Impression was that there was a component of lymphedema present from a previous surgery and he would recommend compression stockings and leg elevation. AFTEN, LASHOMB ([id number]) [id number] Page 9 of 13 Readmission: 07-24-2022 upon evaluation today patient presents for initial inspection here in our clinic concerning issues that she has been having with her legs this is actually been going on for several years according to what her family member with her today tells me as well as what the patient reiterates as well. She is currently most recently been seeing Dr. Ether Griffins and subsequently he had her in [place] boots. However 2 weeks ago he referred her to [place] and then subsequently took her out of the [place] boot wraps at that point. At this time the left leg looks to be worse in the right leg currently. She is on Lasix and lisinopril with hydrochlorothiazide she has high blood pressure she also has issues currently with lower extremity swelling and edema which has been an ongoing issue for her as well. Patient does have a history of chronic venous hypertension, lymphedema, diabetes mellitus  type 2, hypertension, peripheral vascular disease, and neuropathy. Currently she is on Lasix as well as lisinopril with hydrochlorothiazide. 08-02-2022 upon evaluation today patient presents for reevaluation the good news is she is actually doing somewhat better in regard to the wound and the overall appearance and sinuses. The unfortunate thing is her infection really is not significantly improved we did have to switch out her antibiotic once we got that final result back and I switched her to Levaquin and away from the doxycycline. Unfortunately the doxycycline had been doing poorly for her. In fact she had had diarrhea from the time she started taking it on Friday and she is still having it when she shows up today for evaluation. Again I was not aware of this and obviously she does appear to be somewhat dehydrated as well based on what I see. My concern which I discussed with the patient today is the possibility of a C. difficile infection. With that being said fact this started immediately upon taking the doxycycline makes me think that it was just the medicine and is not completely out of her system yet despite having taken the last dose Tuesday morning. Nonetheless with what we are seeing currently I want to be sure that reason I Minna contact her primary care provider and see if a would be willing to see her and test for C. difficile infection. 08-07-2022 upon evaluation today patient appears to be doing well currently in regard to her wounds which are actually measuring much better this is great news. Fortunately I do not see any signs of active infection locally or systemically at this time which is great as well. The good news is she was tested for a C. difficile infection and it was negative. I am very pleased and thankful for primary care provider for doing that so this means that she was just having a severe reaction to the doxycycline we have added that to her allergy list at this  point. 08-14-2022 upon  evaluation today patient appears to be doing well currently in regard to her dehydration she is actually significantly improved compared to last time I saw her last week. With that being said I do not see any evidence of active infection systemically at this time which is great news. However locally she still does appear to have cellulitis in left lower extremity. I discussed with her today that I do believe she would benefit from going ahead and starting the Levaquin. Again we will concerned about the C. difficile infection of the diarrhea but that this turned out to be just an issue with a reaction to the doxycycline. My hope is that the Levaquin will not cause her any complication and will be able to treat the infection. I did review her arterial study as well which was dated on 07-31-2022. It showed that she had a ABI on the right of 1.30 and on the left of 1.27 with a TBI on the right of 1.12 and the left 1.21 this is a normal arterial study. Again on the patient's wound culture she actually did show evidence of Proteus, Morganella, and MRSA. Levaquin is a good option here across the board. 09/26/2022 Ms. Diane Rothenberger is a 82 year old female with a past medical history of type 2 diabetes currently controlled on oral agents, venous insufficiency/lymphedema, right breast cancer and chronic diastolic heart failure that presents to the clinic for a 1 month history of nonhealing wounds to the left lower extremity, right heel and left buttocks. She states that the buttocks wound and the right heel wound developed while she was in the hospital. She was admitted on 08/22/2022 for severe sepsis secondary to acute sigmoid and distal colonic diverticulitis. At that time it was noted she had a stage I decubitus ulcer to her bilateral buttocks. A wound to the heel was not mentioned. She currently denies systemic signs of infection. She came into clinic in a blue gown with no  undergarments. She has not been dressing the wounds. She states she just recently obtained home health and they are coming out for the first time this week. She is seen in our clinic often for lower extremity wounds secondary to venous insufficiency. 11/22; patient presents for follow-up. Son is present during the encounter. Per son it sounds like they are not doing any dressing changes. She states she has home health but they have not come out. She is going to let us know which home health agency she has been approved for so we can send orders. Currently she denies signs of infection. 12/13; patient presents for follow-up. Patient has home health and they are coming out once a week. It appears that there has not been any dressing changes except for with home health. Patient has a newly discovered wound to the left foot. There is exposed bone. There is slight erythema and increased warmth to the surrounding tissue. Patient is completely unaware of this wound. 12/20; patient presents for follow-up. She has been taking her oral antibiotics. She now has an eschar and wound to the right hip. She has been using Dakin's wet-to-dry dressings to the left foot wound and buttocks wound. She has been using Medihoney and silver alginate to the right heel wound. She currently denies systemic signs of infection. She is mainly bedbound or in a wheelchair. 1/10; patient presents for follow-up. Patient had her left second toenail removed by Dr. Ether Griffins, podiatry on 12/21. Unfortunately she did not feel well and she was admitted to the hospital  for sepsis. Her right heel wound was thought to be infected and this was debrided in the OR by Dr. Ether Griffins. Culture and bone biopsy had no growth. She has been using Medihoney to all the wound beds except for the lefty buttocks wound for which she uses Dakin's wet-to-dry dressings. She has developed a pressure injury to the left heel now. This is despite having Prevalon boots.  Although she is not wearing them today. She has completed 4 weeks of Augmentin and also a week of doxycycline for osteomyelitis of the left foot. Her left foot wound no longer probes to bone. Currently she denies signs of infection. 1/24; patient presents for follow-up. She has been using Medihoney and Hydrofera Blue to the wound beds except for this buttocks wound she has been using Dakin's wet-to-dry dressings. She has developed 2 new wounds 1 to the left heel and the other to the right medial ankle. She claims she is wearing her Prevalon boots all the time although she does not have them on today. She currently denies signs of infection. 2/21; patient presents for follow-up. She was recently hospitalized on 12/19/2022 for sepsis secondary to Enterococcus bacillus bacteremia. She was treated with IV antibiotics and has completed her discharge oral antibiotics. She had a CT scanning of the abdomen/pelvis without acute abnormalities. Husband is present with patient today. They have been using Dakin's wet-to-dry dressings to the sacral wound and hip wound and Hydrofera Blue and Medihoney to the feet wounds. They are not changing the dressings daily. It is unclear how often they actually change the dressings. She has been wearing her Prevalon boots at night but not during the day. She currently denies systemic signs of infection. 3/20; patient presents for follow-up. She again was in the hospital for severe sepsis on 2/24; MRIs at that time showed findings suspicious for osteomyelitis to the right hip. She is currently on Augmentin to complete 4 weeks of this. She is following with ID. She has been using Dakin's wet-to-dry dressings to the sacrum and right hip however the solution is starting to cause discomfort. T the rest of the wound she has been using Hydrofera Blue and Medihoney. Patient o and son are not interested in doing palliative care or hospice. They state that they had discussions while in  the hospital. 4/17; patient presents for follow-up. She has been using Vashe wet-to-dry dressings to the sacrum and right trochanter wound. She has been using Medihoney and Hydrofera Blue to the bilateral feet wounds. She has no issues or complaints today. She denies signs of infection. 5/1; patient developed a new wound to the right leg. Son was present and states that there was a scab and he removed it creating a wound. He has been keeping the area covered. T the sacral and right trochanter wound they have been using Vashe wet-to-dry dressings and Medihoney and Hydrofera Blue to the o bilateral feet wounds. She denies signs of infection. [name], [name] ([id number]) [id number] Page 10 of 13 Objective Constitutional Vitals Time Taken: 2:53 PM, Weight: 116 lbs, Temperature: 98.7 F, Pulse: 105 bpm, Respiratory Rate: 18 breaths/min, Blood Pressure: 116/70 mmHg. General Notes: T the right heel there is an open wound with granulation tissue and scant non viable tissue. T the medial malleolus there is an open wound with o o granulation tissue and nonviable tissue. T the posterior right leg there are 2 areas open with granulation tissue. T the left buttocks there is an open wound with o o granulation tissue  and non viable tissue. T the left heel there is an open wound with granulation tissue and non viable tissue. T the right hip there is a wound o o with tendon and granulation tissue, No signs of overt acute infection. Integumentary (Hair, Skin) Wound #11 status is Open. Original cause of wound was Pressure Injury. The date acquired was: 10/24/2022. The wound has been in treatment 19 weeks. The wound is located on the Right Trochanter. The wound measures 4.5cm length x 4cm width x 1.1cm depth; 14.137cm^2 area and 15.551cm^3 volume. There is muscle, tendon, Fat Layer (Subcutaneous Tissue), and fascia exposed. There is no tunneling or undermining noted. There is a medium  amount of serosanguineous drainage noted. The wound margin is well defined and not attached to the wound base. There is medium (34-66%) red granulation within the wound bed. There is a medium (34-66%) amount of necrotic tissue within the wound bed including Adherent Slough. Wound #12 status is Open. Original cause of wound was Pressure Injury. The date acquired was: 12/05/2022. The wound has been in treatment 14 weeks. The wound is located on the Right,Medial Ankle. The wound measures 4.5cm length x 4cm width x 0.3cm depth; 14.137cm^2 area and 4.241cm^3 volume. There is no tunneling or undermining noted. There is a medium amount of serosanguineous drainage noted. The wound margin is flat and intact. There is no granulation within the wound bed. There is a large (67-100%) amount of necrotic tissue within the wound bed including Eschar and Adherent Slough. Wound #13 status is Open. Original cause of wound was Pressure Injury. The date acquired was: 11/28/2022. The wound has been in treatment 14 weeks. The wound is located on the Left Calcaneus. The wound measures 1.4cm length x 2cm width x 0.3cm depth; 2.199cm^2 area and 0.66cm^3 volume. There is Fat Layer (Subcutaneous Tissue) exposed. There is no tunneling or undermining noted. There is a medium amount of serosanguineous drainage noted. The wound margin is flat and intact. There is small (1-33%) pink granulation within the wound bed. There is a large (67-100%) amount of necrotic tissue within the wound bed including Adherent Slough. Wound #14 status is Open. Original cause of wound was Gradually Appeared. The date acquired was: 03/10/2023. The wound is located on the Right,Distal,Medial Lower Leg. The wound measures 3.5cm length x 2cm width x 0.1cm depth; 5.498cm^2 area and 0.55cm^3 volume. There is Fat Layer (Subcutaneous Tissue) exposed. There is no tunneling or undermining noted. There is a medium amount of serosanguineous drainage noted. There is  large (67- 100%) red granulation within the wound bed. There is no necrotic tissue within the wound bed. Wound #6 status is Open. Original cause of wound was Pressure Injury. The date acquired was: 07/23/2022. The wound has been in treatment 24 weeks. The wound is located on the Right Calcaneus. The wound measures 1.5cm length x 1cm width x 0.2cm depth; 1.178cm^2 area and 0.236cm^3 volume. There is Fat Layer (Subcutaneous Tissue) exposed. There is no tunneling or undermining noted. There is a medium amount of serosanguineous drainage noted. The wound margin is distinct with the outline attached to the wound base. There is small (1-33%) pink granulation within the wound bed. There is a large (67-100%) amount of necrotic tissue within the wound bed including Adherent Slough. Wound #8 status is Open. Original cause of wound was Pressure Injury. The date acquired was: 07/23/2022. The wound has been in treatment 24 weeks. The wound is located on the Midline Sacrum. The wound measures 0.6cm length x 1cm  width x 0.3cm depth; 0.471cm^2 area and 0.141cm^3 volume. There is Fat Layer (Subcutaneous Tissue) exposed. There is no tunneling or undermining noted. There is a medium amount of serosanguineous drainage noted. Foul odor after cleansing was noted. The wound margin is distinct with the outline attached to the wound base. There is large (67-100%) red granulation within the wound bed. There is a small (1-33%) amount of necrotic tissue within the wound bed including Adherent Slough. Assessment Active Problems ICD-10 Non-pressure chronic ulcer of other part of left lower leg with fat layer exposed Non-pressure chronic ulcer of other part of right lower leg with fat layer exposed Chronic venous hypertension (idiopathic) with ulcer of left lower extremity Lymphedema, not elsewhere classified Type 2 diabetes mellitus with other skin ulcer Chronic diastolic (congestive) heart failure Pressure ulcer of left  buttock, unstageable Pressure ulcer of right heel, unstageable Unspecified open wound, left foot, initial encounter Other acute osteomyelitis, left ankle and foot Pressure ulcer of right hip, unstageable Pressure ulcer of right ankle, stage 3 Pressure ulcer of left heel, unstageable Long term (current) use of oral hypoglycemic drugs Patient presents with a new wound to the posterior right leg after removal of the scab. I recommended Hydrofera Blue here. The other remaining wounds are stable. I debrided nonviable tissue. I recommended continue the course with Vashe wet-to-dry dressings to the buttocks wound and right hip wound. Can continue Hydrofera Blue and Medihoney to the feet wounds. No active signs of infection. Follow-up in 4 weeks. Patient knows to call with any questions or concerns. Procedures Wound #12 Pre-procedure diagnosis of Wound #12 is a Pressure Ulcer located on the Right,Medial Ankle . There was a Selective/Open Wound Non-Viable Tissue Debridement with a total area of 14.13 sq cm performed by Geralyn Corwin, MD. With the following instrument(s): Curette to remove Viable and Non-Viable [name], [name] ([id number]) [id number] Page 11 of 13 tissue/material. Material removed includes Walters. No specimens were taken. A time out was conducted at 15:15, prior to the start of the procedure. A Minimum amount of bleeding was controlled with Pressure. The procedure was tolerated well with a pain level of 0 throughout and a pain level of 0 following the procedure. Post Debridement Measurements: 4.5cm length x 4cm width x 0.3cm depth; 4.241cm^3 volume. Post debridement Stage noted as Category/Stage IV. Character of Wound/Ulcer Post Debridement is improved. Post procedure Diagnosis Wound #12: Same as Pre-Procedure Wound #13 Pre-procedure diagnosis of Wound #13 is a Pressure Ulcer located on the Left Calcaneus . There was a Selective/Open Wound Non-Viable  Tissue Debridement with a total area of 2.2 sq cm performed by Geralyn Corwin, MD. With the following instrument(s): Curette to remove Viable and Non-Viable tissue/material. Material removed includes Slough. No specimens were taken. A time out was conducted at 15:15, prior to the start of the procedure. A Minimum amount of bleeding was controlled with Pressure. The procedure was tolerated well with a pain level of 0 throughout and a pain level of 0 following the procedure. Post Debridement Measurements: 1.4cm length x 2cm width x 0.3cm depth; 0.66cm^3 volume. Post debridement Stage noted as Category/Stage III. Character of Wound/Ulcer Post Debridement requires further debridement. Post procedure Diagnosis Wound #13: Same as Pre-Procedure Plan 1. Aggressive offloading 2. Hydrofera Blue 3. Medihoney 4. Vashe wet-to-dry dressings 5. Follow-up in 4 weeks 6. In office sharp debridement Electronic Signature(s) Signed: 03/13/2023 3:52:38 PM By: Geralyn Corwin DO Entered By: Geralyn Corwin on 03/13/2023 15:32:31 -------------------------------------------------------------------------------- ROS/PFSH Details Patient Name: Date of Service: [facility],  [name] 03/13/2023 3:00 PM Medical Record Number: [id number] Patient Account Number: [id number] Date of Birth/Sex: Treating RN: [birth year] (82 y.o. [name] Primary Care Provider: Barbette Reichmann Other Clinician: Referring Provider: Treating Provider/Extender: Adolph Pollack Weeks in Treatment: 24 Information Obtained From Patient Eyes Medical History: Positive for: Cataracts - removed 2 years ago Negative for: Glaucoma; Optic Neuritis Ear/Nose/Mouth/Throat Medical History: Negative for: Chronic sinus problems/congestion; Middle ear problems Hematologic/Lymphatic Medical History: Positive for: Lymphedema Negative for: Anemia; Hemophilia; Human Immunodeficiency Virus; Sickle Cell Disease Rinke, Azucena  [name] ([id number]) [id number] Page 12 of 13 Respiratory Medical History: Negative for: Aspiration; Asthma; Chronic Obstructive Pulmonary Disease (COPD); Pneumothorax; Sleep Apnea; Tuberculosis Cardiovascular Medical History: Positive for: Hypertension; Peripheral Arterial Disease; Peripheral Venous Disease Gastrointestinal Medical History: Negative for: Cirrhosis ; Colitis; Crohns; Hepatitis A; Hepatitis B; Hepatitis C Endocrine Medical History: Positive for: Type II Diabetes Time with diabetes: 8years Treated with: Oral agents Blood sugar tested every day: No Genitourinary Medical History: Past Medical History Notes: Hx of Kidney stones, hysterectomy Musculoskeletal Medical History: Positive for: Osteoarthritis Past Medical History Notes: Rotator cuff disorder Neurologic Medical History: Positive for: Neuropathy Negative for: Dementia; Quadriplegia; Paraplegia; Seizure Disorder Oncologic Medical History: Past Medical History Notes: Breast Ca Psychiatric Medical History: Negative for: Anorexia/bulimia; Confinement Anxiety HBO Extended History Items Eyes: Cataracts Immunizations Pneumococcal Vaccine: Received Pneumococcal Vaccination: Yes Received Pneumococcal Vaccination On or After 60th Birthday: Yes Implantable Devices None Family and Social History Cancer: No; Diabetes: No; Heart Disease: Yes - Mother,Father; Hereditary Spherocytosis: No; Hypertension: Yes - Mother,Father; Kidney Disease: No; Lung Disease: No; Seizures: No; Stroke: Yes - Mother,Father; Thyroid Problems: No; Tuberculosis: No; Never smoker; Marital Status - Married; Alcohol Use: Never; Drug Use: No History; Caffeine Use: Daily; Financial Concerns: No; Food, Clothing or Shelter Needs: No; Support System Lacking: No; Transportation Concerns: No Electronic Signature(s) Signed: 03/13/2023 3:52:38 PM By: Geralyn Corwin DO Signed: 03/15/2023 11:36:48 AM By: Yevonne Pax RN Entered  By: Geralyn Corwin on 03/13/2023 15:34:57 Hou, [name] ([id number]) [id number] Page 13 of 13 -------------------------------------------------------------------------------- SuperBill Details Patient Name: Date of Service: [facility], [name] 03/13/2023 Medical Record Number: [id number] Patient Account Number: [id number] Date of Birth/Sex: Treating RN: [birth year] (82 y.o. [name] Primary Care Provider: Barbette Reichmann Other Clinician: Referring Provider: Treating Provider/Extender: Adolph Pollack Weeks in Treatment: 24 Diagnosis Coding ICD-10 Codes Code Description [phone] Non-pressure chronic ulcer of other part of left lower leg with fat layer exposed L97.812 Non-pressure chronic ulcer of other part of right lower leg with fat layer exposed I87.312 Chronic venous hypertension (idiopathic) with ulcer of left lower extremity I89.0 Lymphedema, not elsewhere classified E11.622 Type 2 diabetes mellitus with other skin ulcer I50.32 Chronic diastolic (congestive) heart failure L89.320 Pressure ulcer of left buttock, unstageable L89.610 Pressure ulcer of right heel, unstageable S91.302A Unspecified open wound, left foot, initial encounter M86.172 Other acute osteomyelitis, left ankle and foot L89.210 Pressure ulcer of right hip, unstageable L89.513 Pressure ulcer of right ankle, stage 3 L89.620 Pressure ulcer of left heel, unstageable Z79.84 Long term (current) use of oral hypoglycemic drugs Facility Procedures : CPT4 Code: [id number] Description: 97597 - DEBRIDE WOUND 1ST 20 SQ CM OR < ICD-10 Diagnosis Description L89.513 Pressure ulcer of right ankle, stage 3 L89.620 Pressure ulcer of left heel, unstageable Modifier: Quantity: 1 Physician Procedures : CPT4 Code Description Modifier [id number] 99213 - WC PHYS LEVEL 3 - EST PT 25 ICD-10 Diagnosis Description L97.812 Non-pressure chronic ulcer of other part of right lower leg  with fat layer exposed L89.320 Pressure ulcer of left buttock, unstageable  L89.610 Pressure ulcer of right heel, unstageable L89.210 Pressure ulcer of right hip, unstageable Quantity: 1 : [id number] 97597 - WC PHYS DEBR WO ANESTH 20 SQ CM ICD-10 Diagnosis Description L89.513 Pressure ulcer of right ankle, stage 3 L89.620 Pressure ulcer of left heel, unstageable Quantity: 1 Electronic Signature(s) Signed: 03/13/2023 3:52:38 PM By: Geralyn Corwin DO Entered By: Geralyn Corwin on 03/13/2023 15:34:28

## 2023-04-30 ENCOUNTER — Encounter: Payer: Self-pay | Admitting: Physician Assistant

## 2023-04-30 ENCOUNTER — Ambulatory Visit: Payer: PPO | Admitting: Physician Assistant

## 2023-04-30 DIAGNOSIS — Z466 Encounter for fitting and adjustment of urinary device: Secondary | ICD-10-CM

## 2023-04-30 DIAGNOSIS — R339 Retention of urine, unspecified: Secondary | ICD-10-CM | POA: Diagnosis not present

## 2023-04-30 DIAGNOSIS — N958 Other specified menopausal and perimenopausal disorders: Secondary | ICD-10-CM

## 2023-04-30 MED ORDER — REPLENS EXTERNAL COMFORT VA GEL: 1.0000 "application " | Freq: Every day | VAGINAL | 11 refills | Status: AC

## 2023-04-30 NOTE — Progress Notes (Signed)
Cath Change/ Replacement  Patient is present today for a catheter change due to urinary retention.  8ml of water was removed from the balloon, a 16FR Silastic foley cath was removed without difficulty.  Patient was cleaned and prepped in a sterile fashion with betadine. A 16 FR foley cath was replaced into the bladder, no complications were noted. Urine return was noted 1ml and urine was yellow in color. The balloon was filled with 10ml of sterile water. A leg bag was attached for drainage.  Patient tolerated well.    Performed by: Carman Ching, PA-C   Additional notes: She reports discomfort from sitting on her catheter tubing.  She has been sitting on a doughnut cushion, but this is not helping.  We discussed switching to SPT and she will consider this.  Previously noted vulvovaginal candidiasis is significantly improved today.  She does have evidence of severe vulvovaginal atrophy with sparse pubic hair, pale labia, severe clitoral phimosis.  She reports ongoing dryness/irritation.  I encouraged her to use nonhormonal vaginal moisturizers or coconut oil for symptom relief.  Follow up: Return in about 4 weeks (around 05/28/2023) for Catheter exchange.

## 2023-05-22 ENCOUNTER — Encounter: Payer: PPO | Attending: Internal Medicine | Admitting: Internal Medicine

## 2023-05-22 ENCOUNTER — Emergency Department: Payer: PPO

## 2023-05-22 ENCOUNTER — Inpatient Hospital Stay: Payer: PPO

## 2023-05-22 ENCOUNTER — Encounter: Payer: Self-pay | Admitting: Emergency Medicine

## 2023-05-22 ENCOUNTER — Ambulatory Visit: Payer: PPO | Admitting: Internal Medicine

## 2023-05-22 ENCOUNTER — Other Ambulatory Visit: Payer: Self-pay

## 2023-05-22 ENCOUNTER — Inpatient Hospital Stay
Admission: EM | Admit: 2023-05-22 | Discharge: 2023-05-31 | DRG: 299 | Disposition: A | Discharge status: Home Health | Payer: PPO | Source: Ambulatory Visit | Attending: Internal Medicine | Admitting: Internal Medicine

## 2023-05-22 DIAGNOSIS — L97822 Non-pressure chronic ulcer of other part of left lower leg with fat layer exposed: Secondary | ICD-10-CM | POA: Insufficient documentation

## 2023-05-22 DIAGNOSIS — E1140 Type 2 diabetes mellitus with diabetic neuropathy, unspecified: Secondary | ICD-10-CM | POA: Diagnosis not present

## 2023-05-22 DIAGNOSIS — I890 Lymphedema, not elsewhere classified: Secondary | ICD-10-CM | POA: Insufficient documentation

## 2023-05-22 DIAGNOSIS — M19071 Primary osteoarthritis, right ankle and foot: Secondary | ICD-10-CM | POA: Diagnosis not present

## 2023-05-22 DIAGNOSIS — M24562 Contracture, left knee: Secondary | ICD-10-CM | POA: Diagnosis not present

## 2023-05-22 DIAGNOSIS — Z1611 Resistance to penicillins: Secondary | ICD-10-CM | POA: Diagnosis not present

## 2023-05-22 DIAGNOSIS — I252 Old myocardial infarction: Secondary | ICD-10-CM

## 2023-05-22 DIAGNOSIS — Z86718 Personal history of other venous thrombosis and embolism: Secondary | ICD-10-CM

## 2023-05-22 DIAGNOSIS — L89623 Pressure ulcer of left heel, stage 3: Secondary | ICD-10-CM | POA: Diagnosis present

## 2023-05-22 DIAGNOSIS — E43 Unspecified severe protein-calorie malnutrition: Secondary | ICD-10-CM | POA: Diagnosis present

## 2023-05-22 DIAGNOSIS — L89613 Pressure ulcer of right heel, stage 3: Secondary | ICD-10-CM | POA: Diagnosis present

## 2023-05-22 DIAGNOSIS — E440 Moderate protein-calorie malnutrition: Secondary | ICD-10-CM | POA: Diagnosis not present

## 2023-05-22 DIAGNOSIS — I87312 Chronic venous hypertension (idiopathic) with ulcer of left lower extremity: Secondary | ICD-10-CM | POA: Insufficient documentation

## 2023-05-22 DIAGNOSIS — L89214 Pressure ulcer of right hip, stage 4: Secondary | ICD-10-CM | POA: Diagnosis present

## 2023-05-22 DIAGNOSIS — Z01810 Encounter for preprocedural cardiovascular examination: Secondary | ICD-10-CM | POA: Diagnosis not present

## 2023-05-22 DIAGNOSIS — Q272 Other congenital malformations of renal artery: Secondary | ICD-10-CM | POA: Diagnosis not present

## 2023-05-22 DIAGNOSIS — I700 Atherosclerosis of aorta: Secondary | ICD-10-CM | POA: Diagnosis not present

## 2023-05-22 DIAGNOSIS — L89219 Pressure ulcer of right hip, unspecified stage: Secondary | ICD-10-CM | POA: Diagnosis not present

## 2023-05-22 DIAGNOSIS — L89150 Pressure ulcer of sacral region, unstageable: Secondary | ICD-10-CM | POA: Diagnosis present

## 2023-05-22 DIAGNOSIS — Z681 Body mass index (BMI) 19 or less, adult: Secondary | ICD-10-CM

## 2023-05-22 DIAGNOSIS — L89159 Pressure ulcer of sacral region, unspecified stage: Secondary | ICD-10-CM | POA: Diagnosis not present

## 2023-05-22 DIAGNOSIS — E11621 Type 2 diabetes mellitus with foot ulcer: Secondary | ICD-10-CM | POA: Diagnosis not present

## 2023-05-22 DIAGNOSIS — Z853 Personal history of malignant neoplasm of breast: Secondary | ICD-10-CM | POA: Insufficient documentation

## 2023-05-22 DIAGNOSIS — R54 Age-related physical debility: Secondary | ICD-10-CM | POA: Diagnosis present

## 2023-05-22 DIAGNOSIS — M1990 Unspecified osteoarthritis, unspecified site: Secondary | ICD-10-CM | POA: Diagnosis present

## 2023-05-22 DIAGNOSIS — L97812 Non-pressure chronic ulcer of other part of right lower leg with fat layer exposed: Secondary | ICD-10-CM | POA: Insufficient documentation

## 2023-05-22 DIAGNOSIS — R64 Cachexia: Secondary | ICD-10-CM | POA: Diagnosis present

## 2023-05-22 DIAGNOSIS — L89220 Pressure ulcer of left hip, unstageable: Secondary | ICD-10-CM | POA: Diagnosis not present

## 2023-05-22 DIAGNOSIS — I10 Essential (primary) hypertension: Secondary | ICD-10-CM | POA: Diagnosis not present

## 2023-05-22 DIAGNOSIS — X58XXXA Exposure to other specified factors, initial encounter: Secondary | ICD-10-CM | POA: Insufficient documentation

## 2023-05-22 DIAGNOSIS — I2510 Atherosclerotic heart disease of native coronary artery without angina pectoris: Secondary | ICD-10-CM | POA: Diagnosis present

## 2023-05-22 DIAGNOSIS — L03116 Cellulitis of left lower limb: Secondary | ICD-10-CM | POA: Diagnosis present

## 2023-05-22 DIAGNOSIS — L0390 Cellulitis, unspecified: Secondary | ICD-10-CM | POA: Diagnosis present

## 2023-05-22 DIAGNOSIS — L89210 Pressure ulcer of right hip, unstageable: Secondary | ICD-10-CM | POA: Insufficient documentation

## 2023-05-22 DIAGNOSIS — L89629 Pressure ulcer of left heel, unspecified stage: Secondary | ICD-10-CM | POA: Diagnosis not present

## 2023-05-22 DIAGNOSIS — E039 Hypothyroidism, unspecified: Secondary | ICD-10-CM | POA: Diagnosis present

## 2023-05-22 DIAGNOSIS — D638 Anemia in other chronic diseases classified elsewhere: Secondary | ICD-10-CM | POA: Diagnosis not present

## 2023-05-22 DIAGNOSIS — Z9011 Acquired absence of right breast and nipple: Secondary | ICD-10-CM

## 2023-05-22 DIAGNOSIS — E1152 Type 2 diabetes mellitus with diabetic peripheral angiopathy with gangrene: Principal | ICD-10-CM | POA: Diagnosis present

## 2023-05-22 DIAGNOSIS — E1169 Type 2 diabetes mellitus with other specified complication: Secondary | ICD-10-CM | POA: Insufficient documentation

## 2023-05-22 DIAGNOSIS — Z79899 Other long term (current) drug therapy: Secondary | ICD-10-CM | POA: Diagnosis not present

## 2023-05-22 DIAGNOSIS — E46 Unspecified protein-calorie malnutrition: Secondary | ICD-10-CM | POA: Diagnosis not present

## 2023-05-22 DIAGNOSIS — S81801A Unspecified open wound, right lower leg, initial encounter: Secondary | ICD-10-CM | POA: Diagnosis not present

## 2023-05-22 DIAGNOSIS — L03119 Cellulitis of unspecified part of limb: Secondary | ICD-10-CM | POA: Diagnosis not present

## 2023-05-22 DIAGNOSIS — Z79890 Hormone replacement therapy (postmenopausal): Secondary | ICD-10-CM

## 2023-05-22 DIAGNOSIS — I959 Hypotension, unspecified: Secondary | ICD-10-CM | POA: Diagnosis not present

## 2023-05-22 DIAGNOSIS — M24551 Contracture, right hip: Secondary | ICD-10-CM | POA: Diagnosis not present

## 2023-05-22 DIAGNOSIS — L97512 Non-pressure chronic ulcer of other part of right foot with fat layer exposed: Secondary | ICD-10-CM

## 2023-05-22 DIAGNOSIS — Z23 Encounter for immunization: Secondary | ICD-10-CM

## 2023-05-22 DIAGNOSIS — Z7982 Long term (current) use of aspirin: Secondary | ICD-10-CM | POA: Diagnosis not present

## 2023-05-22 DIAGNOSIS — E119 Type 2 diabetes mellitus without complications: Secondary | ICD-10-CM | POA: Diagnosis not present

## 2023-05-22 DIAGNOSIS — E7800 Pure hypercholesterolemia, unspecified: Secondary | ICD-10-CM | POA: Diagnosis present

## 2023-05-22 DIAGNOSIS — M109 Gout, unspecified: Secondary | ICD-10-CM | POA: Diagnosis present

## 2023-05-22 DIAGNOSIS — L97519 Non-pressure chronic ulcer of other part of right foot with unspecified severity: Secondary | ICD-10-CM | POA: Diagnosis not present

## 2023-05-22 DIAGNOSIS — M25552 Pain in left hip: Secondary | ICD-10-CM | POA: Diagnosis not present

## 2023-05-22 DIAGNOSIS — J811 Chronic pulmonary edema: Secondary | ICD-10-CM | POA: Diagnosis not present

## 2023-05-22 DIAGNOSIS — Z923 Personal history of irradiation: Secondary | ICD-10-CM

## 2023-05-22 DIAGNOSIS — L98421 Non-pressure chronic ulcer of back limited to breakdown of skin: Secondary | ICD-10-CM

## 2023-05-22 DIAGNOSIS — I701 Atherosclerosis of renal artery: Secondary | ICD-10-CM | POA: Diagnosis not present

## 2023-05-22 DIAGNOSIS — Z7984 Long term (current) use of oral hypoglycemic drugs: Secondary | ICD-10-CM

## 2023-05-22 DIAGNOSIS — R5381 Other malaise: Secondary | ICD-10-CM | POA: Diagnosis not present

## 2023-05-22 DIAGNOSIS — L89320 Pressure ulcer of left buttock, unstageable: Secondary | ICD-10-CM | POA: Insufficient documentation

## 2023-05-22 DIAGNOSIS — E11622 Type 2 diabetes mellitus with other skin ulcer: Secondary | ICD-10-CM | POA: Insufficient documentation

## 2023-05-22 DIAGNOSIS — L089 Local infection of the skin and subcutaneous tissue, unspecified: Secondary | ICD-10-CM | POA: Diagnosis not present

## 2023-05-22 DIAGNOSIS — L03115 Cellulitis of right lower limb: Secondary | ICD-10-CM | POA: Diagnosis present

## 2023-05-22 DIAGNOSIS — L89610 Pressure ulcer of right heel, unstageable: Secondary | ICD-10-CM | POA: Insufficient documentation

## 2023-05-22 DIAGNOSIS — Z833 Family history of diabetes mellitus: Secondary | ICD-10-CM

## 2023-05-22 DIAGNOSIS — I5032 Chronic diastolic (congestive) heart failure: Secondary | ICD-10-CM | POA: Diagnosis present

## 2023-05-22 DIAGNOSIS — L8990 Pressure ulcer of unspecified site, unspecified stage: Secondary | ICD-10-CM | POA: Diagnosis not present

## 2023-05-22 DIAGNOSIS — T148XXA Other injury of unspecified body region, initial encounter: Secondary | ICD-10-CM | POA: Diagnosis not present

## 2023-05-22 DIAGNOSIS — M24561 Contracture, right knee: Secondary | ICD-10-CM | POA: Diagnosis not present

## 2023-05-22 DIAGNOSIS — M86172 Other acute osteomyelitis, left ankle and foot: Secondary | ICD-10-CM | POA: Insufficient documentation

## 2023-05-22 DIAGNOSIS — N182 Chronic kidney disease, stage 2 (mild): Secondary | ICD-10-CM | POA: Diagnosis present

## 2023-05-22 DIAGNOSIS — Z66 Do not resuscitate: Secondary | ICD-10-CM | POA: Diagnosis not present

## 2023-05-22 DIAGNOSIS — I110 Hypertensive heart disease with heart failure: Secondary | ICD-10-CM | POA: Insufficient documentation

## 2023-05-22 DIAGNOSIS — L89620 Pressure ulcer of left heel, unstageable: Secondary | ICD-10-CM | POA: Insufficient documentation

## 2023-05-22 DIAGNOSIS — Q66219 Congenital metatarsus primus varus, unspecified foot: Secondary | ICD-10-CM | POA: Diagnosis not present

## 2023-05-22 DIAGNOSIS — Z90710 Acquired absence of both cervix and uterus: Secondary | ICD-10-CM

## 2023-05-22 DIAGNOSIS — I130 Hypertensive heart and chronic kidney disease with heart failure and stage 1 through stage 4 chronic kidney disease, or unspecified chronic kidney disease: Secondary | ICD-10-CM | POA: Diagnosis not present

## 2023-05-22 DIAGNOSIS — S91302A Unspecified open wound, left foot, initial encounter: Secondary | ICD-10-CM | POA: Insufficient documentation

## 2023-05-22 DIAGNOSIS — Z1629 Resistance to other single specified antibiotic: Secondary | ICD-10-CM | POA: Diagnosis not present

## 2023-05-22 DIAGNOSIS — L89154 Pressure ulcer of sacral region, stage 4: Secondary | ICD-10-CM | POA: Diagnosis not present

## 2023-05-22 DIAGNOSIS — E8809 Other disorders of plasma-protein metabolism, not elsewhere classified: Secondary | ICD-10-CM | POA: Diagnosis not present

## 2023-05-22 DIAGNOSIS — B9562 Methicillin resistant Staphylococcus aureus infection as the cause of diseases classified elsewhere: Secondary | ICD-10-CM | POA: Diagnosis not present

## 2023-05-22 DIAGNOSIS — E1122 Type 2 diabetes mellitus with diabetic chronic kidney disease: Secondary | ICD-10-CM | POA: Diagnosis present

## 2023-05-22 DIAGNOSIS — L89619 Pressure ulcer of right heel, unspecified stage: Secondary | ICD-10-CM | POA: Diagnosis not present

## 2023-05-22 DIAGNOSIS — Z882 Allergy status to sulfonamides status: Secondary | ICD-10-CM

## 2023-05-22 DIAGNOSIS — M86171 Other acute osteomyelitis, right ankle and foot: Secondary | ICD-10-CM | POA: Diagnosis not present

## 2023-05-22 DIAGNOSIS — L89229 Pressure ulcer of left hip, unspecified stage: Secondary | ICD-10-CM | POA: Diagnosis not present

## 2023-05-22 DIAGNOSIS — S81802D Unspecified open wound, left lower leg, subsequent encounter: Secondary | ICD-10-CM | POA: Diagnosis not present

## 2023-05-22 DIAGNOSIS — Z515 Encounter for palliative care: Secondary | ICD-10-CM | POA: Diagnosis not present

## 2023-05-22 DIAGNOSIS — Z7189 Other specified counseling: Secondary | ICD-10-CM | POA: Diagnosis not present

## 2023-05-22 DIAGNOSIS — M8610 Other acute osteomyelitis, unspecified site: Secondary | ICD-10-CM | POA: Diagnosis not present

## 2023-05-22 LAB — LACTIC ACID, PLASMA
Lactic Acid, Venous: 1.9 mmol/L (ref 0.5–1.9)
Lactic Acid, Venous: 2.5 mmol/L (ref 0.5–1.9)

## 2023-05-22 LAB — CBC WITH DIFFERENTIAL/PLATELET
Abs Immature Granulocytes: 0.29 10*3/uL — ABNORMAL HIGH (ref 0.00–0.07)
Basophils Absolute: 0.1 10*3/uL (ref 0.0–0.1)
Basophils Relative: 1 %
Eosinophils Absolute: 0.2 10*3/uL (ref 0.0–0.5)
Eosinophils Relative: 1 %
HCT: 25.5 % — ABNORMAL LOW (ref 36.0–46.0)
Hemoglobin: 7.7 g/dL — ABNORMAL LOW (ref 12.0–15.0)
Immature Granulocytes: 2 %
Lymphocytes Relative: 14 %
Lymphs Abs: 2 10*3/uL (ref 0.7–4.0)
MCH: 25.8 pg — ABNORMAL LOW (ref 26.0–34.0)
MCHC: 30.2 g/dL (ref 30.0–36.0)
MCV: 85.3 fL (ref 80.0–100.0)
Monocytes Absolute: 0.5 10*3/uL (ref 0.1–1.0)
Monocytes Relative: 4 %
Neutro Abs: 10.9 10*3/uL — ABNORMAL HIGH (ref 1.7–7.7)
Neutrophils Relative %: 78 %
Platelets: 699 10*3/uL — ABNORMAL HIGH (ref 150–400)
RBC: 2.99 MIL/uL — ABNORMAL LOW (ref 3.87–5.11)
RDW: 18 % — ABNORMAL HIGH (ref 11.5–15.5)
WBC: 13.9 10*3/uL — ABNORMAL HIGH (ref 4.0–10.5)
nRBC: 0 % (ref 0.0–0.2)

## 2023-05-22 LAB — COMPREHENSIVE METABOLIC PANEL
ALT: 9 U/L (ref 0–44)
AST: 15 U/L (ref 15–41)
Albumin: 2.5 g/dL — ABNORMAL LOW (ref 3.5–5.0)
Alkaline Phosphatase: 60 U/L (ref 38–126)
Anion gap: 10 (ref 5–15)
BUN: 29 mg/dL — ABNORMAL HIGH (ref 8–23)
CO2: 23 mmol/L (ref 22–32)
Calcium: 8.3 mg/dL — ABNORMAL LOW (ref 8.9–10.3)
Chloride: 107 mmol/L (ref 98–111)
Creatinine, Ser: 0.67 mg/dL (ref 0.44–1.00)
GFR, Estimated: >60 mL/min (ref >60)
Glucose, Bld: 131 mg/dL — ABNORMAL HIGH (ref 70–99)
Potassium: 4 mmol/L (ref 3.5–5.1)
Sodium: 140 mmol/L (ref 135–145)
Total Bilirubin: 0.4 mg/dL (ref 0.3–1.2)
Total Protein: 6.9 g/dL (ref 6.5–8.1)

## 2023-05-22 LAB — GLUCOSE, CAPILLARY: Glucose-Capillary: 93 mg/dL (ref 70–99)

## 2023-05-22 MED ORDER — HYDROCODONE-ACETAMINOPHEN 7.5-325 MG PO TABS
1.0000 | ORAL_TABLET | Freq: Two times a day (BID) | ORAL | Status: DC | PRN
  Administered 2023-05-22 – 2023-05-26 (×6): 1 via ORAL
  Filled 2023-05-22 (×6): qty 1

## 2023-05-22 MED ORDER — LEVOTHYROXINE SODIUM 88 MCG PO TABS
88.0000 ug | ORAL_TABLET | Freq: Every day | ORAL | Status: DC
  Administered 2023-05-23 – 2023-05-31 (×8): 88 ug via ORAL
  Filled 2023-05-22 (×9): qty 1

## 2023-05-22 MED ORDER — INSULIN ASPART 100 UNIT/ML IJ SOLN: 0.0000 [IU] | Freq: Every day | INTRAMUSCULAR | Status: DC

## 2023-05-22 MED ORDER — PNEUMOCOCCAL 20-VAL CONJ VACC 0.5 ML IM SUSY
0.5000 mL | PREFILLED_SYRINGE | INTRAMUSCULAR | Status: AC
  Administered 2023-05-25: 0.5 mL via INTRAMUSCULAR
  Filled 2023-05-22: qty 0.5

## 2023-05-22 MED ORDER — SODIUM CHLORIDE 0.9 % IV SOLN: INTRAVENOUS | Status: DC

## 2023-05-22 MED ORDER — ATORVASTATIN CALCIUM 20 MG PO TABS
40.0000 mg | ORAL_TABLET | Freq: Every day | ORAL | Status: DC
  Administered 2023-05-22 – 2023-05-31 (×10): 40 mg via ORAL
  Filled 2023-05-22 (×10): qty 2

## 2023-05-22 MED ORDER — VITAMIN B-12 1000 MCG PO TABS
1000.0000 ug | ORAL_TABLET | Freq: Every day | ORAL | Status: DC
  Administered 2023-05-22 – 2023-05-31 (×10): 1000 ug via ORAL
  Filled 2023-05-22 (×10): qty 1

## 2023-05-22 MED ORDER — ACETAMINOPHEN 650 MG RE SUPP: 650.0000 mg | Freq: Four times a day (QID) | RECTAL | Status: DC | PRN

## 2023-05-22 MED ORDER — ACETAMINOPHEN 325 MG PO TABS
650.0000 mg | ORAL_TABLET | Freq: Four times a day (QID) | ORAL | Status: DC | PRN
  Administered 2023-05-23 – 2023-05-30 (×6): 650 mg via ORAL
  Filled 2023-05-22 (×6): qty 2

## 2023-05-22 MED ORDER — SODIUM CHLORIDE 0.9 % IV SOLN: 2.0000 g | Freq: Once | INTRAVENOUS | Status: DC

## 2023-05-22 MED ORDER — COLLAGENASE 250 UNIT/GM EX OINT
TOPICAL_OINTMENT | Freq: Every day | CUTANEOUS | Status: DC
  Filled 2023-05-22: qty 30

## 2023-05-22 MED ORDER — ASPIRIN 81 MG PO TBEC
81.0000 mg | DELAYED_RELEASE_TABLET | Freq: Every day | ORAL | Status: DC
  Administered 2023-05-22 – 2023-05-31 (×10): 81 mg via ORAL
  Filled 2023-05-22 (×10): qty 1

## 2023-05-22 MED ORDER — POLYETHYLENE GLYCOL 3350 17 G PO PACK
17.0000 g | PACK | Freq: Every day | ORAL | Status: DC
  Administered 2023-05-23 – 2023-05-31 (×8): 17 g via ORAL
  Filled 2023-05-22 (×9): qty 1

## 2023-05-22 MED ORDER — GABAPENTIN 100 MG PO CAPS
100.0000 mg | ORAL_CAPSULE | Freq: Every day | ORAL | Status: DC
  Administered 2023-05-23 – 2023-05-31 (×9): 100 mg via ORAL
  Filled 2023-05-22 (×9): qty 1

## 2023-05-22 MED ORDER — POLYETHYLENE GLYCOL 3350 17 G PO PACK: 17.0000 g | PACK | Freq: Every day | ORAL | Status: DC | PRN

## 2023-05-22 MED ORDER — VANCOMYCIN HCL 750 MG/150ML IV SOLN: 750.0000 mg | INTRAVENOUS | Status: DC

## 2023-05-22 MED ORDER — PIPERACILLIN-TAZOBACTAM 3.375 G IVPB
3.3750 g | Freq: Three times a day (TID) | INTRAVENOUS | Status: DC
  Administered 2023-05-22 – 2023-05-31 (×27): 3.375 g via INTRAVENOUS
  Filled 2023-05-22 (×26): qty 50

## 2023-05-22 MED ORDER — ENOXAPARIN SODIUM 40 MG/0.4ML IJ SOSY
40.0000 mg | PREFILLED_SYRINGE | INTRAMUSCULAR | Status: DC
  Administered 2023-05-23 – 2023-05-29 (×7): 40 mg via SUBCUTANEOUS
  Filled 2023-05-22 (×7): qty 0.4

## 2023-05-22 MED ORDER — ALLOPURINOL 100 MG PO TABS
300.0000 mg | ORAL_TABLET | Freq: Every day | ORAL | Status: DC
  Administered 2023-05-23 – 2023-05-31 (×9): 300 mg via ORAL
  Filled 2023-05-22 (×9): qty 3

## 2023-05-22 MED ORDER — THIAMINE HCL 100 MG/ML IJ SOLN
100.0000 mg | Freq: Every day | INTRAMUSCULAR | Status: DC
  Administered 2023-05-22 – 2023-05-28 (×7): 100 mg via INTRAVENOUS
  Filled 2023-05-22 (×7): qty 2

## 2023-05-22 MED ORDER — VANCOMYCIN HCL IN DEXTROSE 1-5 GM/200ML-% IV SOLN
1000.0000 mg | Freq: Once | INTRAVENOUS | Status: AC
  Administered 2023-05-23: 1000 mg via INTRAVENOUS
  Filled 2023-05-22: qty 200

## 2023-05-22 MED ORDER — GADOBUTROL 1 MMOL/ML IV SOLN
5.0000 mL | Freq: Once | INTRAVENOUS | Status: AC | PRN
  Administered 2023-05-22: 5 mL via INTRAVENOUS

## 2023-05-22 MED ORDER — INSULIN ASPART 100 UNIT/ML IJ SOLN
0.0000 [IU] | Freq: Three times a day (TID) | INTRAMUSCULAR | Status: DC
  Administered 2023-05-23 (×2): 2 [IU] via SUBCUTANEOUS
  Administered 2023-05-24: 3 [IU] via SUBCUTANEOUS
  Administered 2023-05-26: 1 [IU] via SUBCUTANEOUS
  Administered 2023-05-28: 2 [IU] via SUBCUTANEOUS
  Administered 2023-05-30 (×2): 1 [IU] via SUBCUTANEOUS
  Filled 2023-05-22 (×7): qty 1

## 2023-05-22 NOTE — Assessment & Plan Note (Signed)
Hold oral agents, insulin sliding scale

## 2023-05-22 NOTE — Consult Note (Signed)
WOC Nurse Consult Note: Reason for Consult:Several pressure injuries and full thickness wounds to LEs, known to our service from previous admissions. Seen by outpatient wound care center [facility]) earlier today and referred to ED due to worsening of wounds, but no note has been posted for today's encounter in that setting. Also noted by EDP and admitting MD are wounds with surrounding erythema, change in exudate consistent with infection, failure to thrive in the adult, and a malnourished state, in additional to other comorbid conditions. She has additionally been seen by infectious disease providers in the past.  Patient receives [facility] services weekly at home and lives with family. Consult performed remotely after review of the patient's chart including photographs.  Wound type:Pressure,  Pressure Injury POA: Yes Measurement:Bedside RN to measure wounds and document wound measurements on Nursing flow Sheet with application of next dressings today Wound bed:See photographs uploaded to the EMR by EDP this afternoon Drainage (amount, consistency, odor) Moderate to large serous to yellow Periwound:Right medial LE and right hip with surrounding erythema Dressing procedure/placement/frequency: I have discussed with Dr. Regina Eck via Secure Chat and recommended surgical, orthopedic or Vascular consults as these wounds have worsened and an operative intervention may be required including debridement.  It is unlikely that these ulcers can heal. Treatment plan will consist of topical care, turning and repositioning and off loading as able as all turning surfaces are affected.  I will provide a mattress replacement with low air loss feature, and bilateral pressure redistribution heel boots. I will provide topical care guidance for Nursing as below. Sacral Unstageable pressure injury and left hip Unstageable pressure injury with 100% nonviable tissue:  Cleanse wounds daily with Vashe (pure hypochlorous acid, Lawson #  [id number]), pat dry. Apply collagenase (santyl) to wound, top with saline dampened gauze, top with dry gauze, secure with silicone foam. Right medial LE full thickness wound and bilateral heel Stage 3 pressure injuries:  Cleanse wounds daily with Vashe, pat dry. Fill/cover defect with Vashe moistened gauze, top with dry gauze, cover with ABD pad and secure with Kerlix roll gauze/paper tape. Place feet into Prevalon boots. Right hip Stage 4 pressure injury: Cleanse twice daily with Vashe, pat surrounding skin dry. Fill defect with Vashe moistened gauze, top with dry gauze, ABD pad and secure with paper tape.  Bilateral LEs  with partial thickness skin loss:  Wash legs daily during bathing, pat dry. When dry, moisturize skin with house moisturizing cream, Sween 24.  Any guidance I provide for wound care via Nursing Orders will be superceded by Providers consulted should they  write other care orders.  Consider Palliative Care consult, Nutritional Consults in addition to those mentioned above.  WOC nursing team will not follow, but will remain available to this patient, the nursing and medical teams.  Please re-consult if needed.  Thank you for inviting [place] to participate in this patient's Plan of Care.  Ladona Mow, MSN, RN, CNS, GNP, Leda Min, [employer], [employer] phone:  [phone]

## 2023-05-22 NOTE — Assessment & Plan Note (Signed)
This is a chronic diagnosis.  Patient has worsening anemia today, I will draw type and screen, no evidence of active bleeding.  I will send anemia panel workup.

## 2023-05-22 NOTE — H&P (Addendum)
History and Physical    Patient: [name]:[id number] DOB: 03-Sep-1941 DOA: 05/22/2023 DOS: the patient was seen and examined on 05/22/2023 PCP: Barbette Reichmann, MD  Patient coming from:  wound care cl inic  Chief Complaint:  Chief Complaint  Patient presents with   Wound Infection   HPI: [name] is a 82 y.o. 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  Patient reports these have been present since approximately January.  Patient follows up at wound care clinic.  Patient was seen for a routine evaluation today.  Patient's wounds were felt to be worsening and possibly infected.  Therefore patient is sent to the ER today.  History is obtained from the patient and corroborated by chart review.  Patient may have been more lethargic over the last couple of days.  However no report of fever rigors loss of consciousness or tremors.  Patient reports very poor intake, has had a biscuit to eat all day today.  Nothing else.  Patient is otherwise feeling okay.. Review of Systems: As mentioned in the history of present illness. All other systems reviewed and are negative. Past Medical History:  Diagnosis Date   Arthritis    Arthritis    Breast cancer (HCC) 1992   right breast with lumpectomy and rad tx   Cancer of right breast (HCC) 04/16/2013   right breast with mastectomy   Diabetes mellitus without complication (HCC)    Gout    High cholesterol    Hyperlipidemia    Hypertension    Personal history of radiation therapy    Past Surgical History:  Procedure Laterality Date   ABDOMINAL HYSTERECTOMY     ANKLE FRACTURE SURGERY Right 2004   BREAST LUMPECTOMY Right 1992   positive   IRRIGATION AND DEBRIDEMENT FOOT Right 11/02/2022   Procedure: IRRIGATION AND DEBRIDEMENT RIGHT FOOT AND BONE BIOPSY RIGHT FOOT;  Surgeon: Gwyneth Revels, DPM;  Location: [facility] ORS;  Service: Podiatry;  Laterality: Right;   IVC FILTER  INSERTION N/A 11/06/2022   Procedure: IVC FILTER INSERTION;  Surgeon: Annice Needy, MD;  Location: [facility] INVASIVE CV LAB;  Service: Cardiovascular;  Laterality: N/A;   MASTECTOMY Right 2014   PERIPHERAL VASCULAR THROMBECTOMY Left 11/06/2022   Procedure: PERIPHERAL VASCULAR THROMBECTOMY- EXTERNAL Iliac Vein/CFV;  Surgeon: Annice Needy, MD;  Location: [facility] INVASIVE CV LAB;  Service: Cardiovascular;  Laterality: Left;   SHOULDER SURGERY Right 2010   Social History:  reports that she has never smoked. She has never been exposed to tobacco smoke. She has never used smokeless tobacco. She reports that she does not drink alcohol and does not use drugs.  Allergies  Allergen Reactions   Other Anaphylaxis    Anesthisia has been a health issue for her in the past.    Sulfa Antibiotics Rash    Family History  Problem Relation Age of Onset   Diabetes Father    Breast cancer Neg Hx     Prior to Admission medications   Medication Sig Start Date End Date Taking? Authorizing Provider  acetaminophen (TYLENOL) 500 MG tablet Take 500-1,000 mg by mouth 4 (four) times daily as needed for mild pain or moderate pain.   Yes [provider]  allopurinol (ZYLOPRIM) 300 MG tablet Take 300 mg by mouth daily.   Yes [provider]  aspirin EC 81 MG tablet Take 1 tablet (81 mg total) by mouth daily. Swallow whole. 12/25/22  Yes Arnetha Courser,  MD  atenolol (TENORMIN) 50 MG tablet Take 50 mg by mouth daily.   Yes [provider]  atorvastatin (LIPITOR) 40 MG tablet Take 40 mg by mouth daily.    Yes [provider]  diphenoxylate-atropine (LOMOTIL) 2.5-0.025 MG tablet Take 1-2 tablets by mouth 3 (three) times daily as needed. 08/20/22  Yes [provider]  furosemide (LASIX) 40 MG tablet Take 1 tablet (40 mg total) by mouth daily as needed for edema or fluid. 12/24/22  Yes Arnetha Courser, MD  gabapentin (NEURONTIN) 100 MG capsule TAKE 1 CAPSULE BY MOUTH ONCE DAILY FOR 7 DAYS THEN  TAKE 1 CAPSULES BY MOUTH TWICE DAILY 02/21/23  Yes [provider]  gentamicin cream (GARAMYCIN) 0.1 % Apply 1 Application topically in the morning and at bedtime.   Yes [provider]  HYDROcodone-acetaminophen (NORCO) 7.5-325 MG tablet Take 1 tablet by mouth 2 (two) times daily as needed for moderate pain or severe pain.   Yes [provider]  levothyroxine (SYNTHROID) 88 MCG tablet Take 88 mcg by mouth daily.   Yes [provider]  lisinopril (ZESTRIL) 5 MG tablet Take 5 mg by mouth daily. 09/24/22 09/24/23 Yes [provider]  metFORMIN (GLUCOPHAGE) 1000 MG tablet Take 1,000 mg by mouth in the morning and at bedtime.   Yes [provider]  Multiple Vitamin (MULTIVITAMIN WITH MINERALS) TABS tablet Take 1 tablet by mouth daily. 01/12/23  Yes Lurene Shadow, MD  polyethylene glycol (MIRALAX / GLYCOLAX) 17 g packet Take 17 g by mouth daily. 12/25/22  Yes Arnetha Courser, MD  sitaGLIPtin (JANUVIA) 50 MG tablet Take 50 mg by mouth daily.   Yes [provider]  Vaginal Moisturizer (REPLENS EXTERNAL COMFORT) GEL Apply 1 application  topically daily. 04/30/23  Yes Vaillancourt, Samantha, PA-C  vitamin B-12 (CYANOCOBALAMIN) 1000 MCG tablet Take 1,000 mcg by mouth daily.   Yes [provider]  sodium hypochlorite (DAKIN'S 1/4 STRENGTH) 0.125 % SOLN moisten gauze FOR wet TO DRY dressings. Patient not taking: Reported on 05/22/2023 10/22/22   [provider]    Physical Exam: Vitals:   05/22/23 1619 05/22/23 1622 05/22/23 1751  BP:  123/62   Pulse:  97   Resp:  18   Temp: 98.5 F (36.9 C)    TempSrc: Oral    SpO2:  99%   Weight:   52.6 kg  Height:   4\' 11"  (1.499 m)   General: Thin lady, no distress, soft-spoken, gives reasonable answers to questions, coherent Respiratory exam: Bilateral intravesicular Cardiovascular exam S1-S2 normal Abdomen all quadrants soft Extremities warm without edema.  There are several  ulcerations noted over bilateral trochanteric prominences, bilateral heel, right medial heel.  See representative photo    Media Information   Document Information right heel - almost bone deep  Photos  Right  05/22/2023 19:18  Attached To:  Hospital Encounter on 05/22/23  Source Information  Nolberto Hanlon, MD  [facility]     Media Information   Document Information - left heel - almost bone deep  Photos  Left  05/22/2023 19:18  Attached To:  Hospital Encounter on 05/22/23  Source Information  Nolberto Hanlon, MD  [facility]    Legs -    Media Information   Document Information  Photos    05/22/2023 19:18  Attached To:  Hospital Encounter on 05/22/23  Source Information  Nolberto Hanlon, MD  [facility]   Right hip    Media Information   Document Information -  almost bone deep.  Photos  Right hip  05/22/2023 19:18  Attached To:  Hospital Encounter on 05/22/23  Source Information  Nolberto Hanlon, MD  [facility]     Media Information   Document Information -deep to deep fascia. Spreading erythema. Movement of ankle is preserved.  Photos  Right medial ankle  05/22/2023 19:18  Attached To:  Hospital Encounter on 05/22/23  Source Information  Nolberto Hanlon, MD  [facility]  Right medial ankle.  Left hip    Media Information   Document Information unstageable.  Photos  Left hip  05/22/2023 19:18  Attached To:  Hospital Encounter on 05/22/23  Source Information  Nolberto Hanlon, MD  [facility]   Data Reviewed:  Labs on Admission:  Results for orders placed or performed during the hospital encounter of 05/22/23 (from the past 24 hour(s))  Lactic acid, plasma     Status: None   Collection Time: 05/22/23  4:27 PM  Result Value Ref Range   Lactic Acid, Venous 1.9 0.5 - 1.9 mmol/L  Comprehensive metabolic panel     Status: Abnormal   Collection Time:  05/22/23  4:27 PM  Result Value Ref Range   Sodium 140 135 - 145 mmol/L   Potassium 4.0 3.5 - 5.1 mmol/L   Chloride 107 98 - 111 mmol/L   CO2 23 22 - 32 mmol/L   Glucose, Bld 131 (H) 70 - 99 mg/dL   BUN 29 (H) 8 - 23 mg/dL   Creatinine, Ser 1.61 0.44 - 1.00 mg/dL   Calcium 8.3 (L) 8.9 - 10.3 mg/dL   Total Protein 6.9 6.5 - 8.1 g/dL   Albumin 2.5 (L) 3.5 - 5.0 g/dL   AST 15 15 - 41 U/L   ALT 9 0 - 44 U/L   Alkaline Phosphatase 60 38 - 126 U/L   Total Bilirubin 0.4 0.3 - 1.2 mg/dL   GFR, Estimated >09 >60 mL/min   Anion gap 10 5 - 15  CBC with Differential     Status: Abnormal   Collection Time: 05/22/23  4:27 PM  Result Value Ref Range   WBC 13.9 (H) 4.0 - 10.5 K/uL   RBC 2.99 (L) 3.87 - 5.11 MIL/uL   Hemoglobin 7.7 (L) 12.0 - 15.0 g/dL   HCT 45.4 (L) 09.8 - 11.9 %   MCV 85.3 80.0 - 100.0 fL   MCH 25.8 (L) 26.0 - 34.0 pg   MCHC 30.2 30.0 - 36.0 g/dL   RDW 14.7 (H) 82.9 - 56.2 %   Platelets 699 (H) 150 - 400 K/uL   nRBC 0.0 0.0 - 0.2 %   Neutrophils Relative % 78 %   Neutro Abs 10.9 (H) 1.7 - 7.7 K/uL   Lymphocytes Relative 14 %   Lymphs Abs 2.0 0.7 - 4.0 K/uL   Monocytes Relative 4 %   Monocytes Absolute 0.5 0.1 - 1.0 K/uL   Eosinophils Relative 1 %   Eosinophils Absolute 0.2 0.0 - 0.5 K/uL   Basophils Relative 1 %   Basophils Absolute 0.1 0.0 - 0.1 K/uL   Immature Granulocytes 2 %   Abs Immature Granulocytes 0.29 (H) 0.00 - 0.07 K/uL   Basic Metabolic Panel: Recent Labs  Lab 05/22/23 1627  NA 140  K 4.0  CL 107  CO2 23  GLUCOSE 131*  BUN 29*  CREATININE 0.67  CALCIUM 8.3*   Liver Function Tests: Recent Labs  Lab 05/22/23 1627  AST 15  ALT 9  ALKPHOS 60  BILITOT  0.4  PROT 6.9  ALBUMIN 2.5*   No results for input(s): "LIPASE", "AMYLASE" in the last 168 hours. No results for input(s): "AMMONIA" in the last 168 hours. CBC: Recent Labs  Lab 05/22/23 1627  WBC 13.9*  NEUTROABS 10.9*  HGB 7.7*  HCT 25.5*  MCV 85.3  PLT 699*   Cardiac  Enzymes: No results for input(s): "CKTOTAL", "CKMB", "CKMBINDEX", "TROPONINIHS" in the last 168 hours.  BNP (last 3 results) No results for input(s): "PROBNP" in the last 8760 hours. CBG: No results for input(s): "GLUCAP" in the last 168 hours.  Radiological Exams on Admission:  No results found.     Assessment and Plan: * Cellulitis Patient is sent in from her wound clinic for her chronic wounds due to concern of infection.  See representative photo as above.  Patient has a mixture of dry gangrene and chronic appearing wounds.  However the right medial ankle wound as pictured above has definitely some purulent drainage.  Spreading erythema as pictured.  Therefore this is cellulitis purulent.  Will treat with vancomycin and Zosyn as there is also some dry/wet appearing gangrene.  However there is no crepitus, joint involvement does not seem to be present.  Patient's other wounds do not seem grossly infected, although they do have some dry gangrene areas.  Patient has an MRI of the pelvis pending to rule out osteomyelitis in that area.  I will also get an x-ray of the right ankle.  I could not probe the bone directly in any of the wounds.  Anemia of chronic disease This is a chronic diagnosis.  Patient has worsening anemia today, I will draw type and screen, no evidence of active bleeding.  I will send anemia panel workup.  Malnutrition of moderate degree Has a diagnosis previously documented in the chart.  It is corroborated by my history with the patient today.  I suspect this is the reason for patient having formed pressure ulceration in so many areas.  I will request a nutrition evaluation  Hypertension Blood pressure seems to be soft at this time, will hold off on antihypertensive regimen.  Kind of restart same in the morning  Diabetes mellitus, type 2 (HCC) Hold oral agents, insulin sliding scale   ___________________________________ Addendum Herby Abraham concerning for right foot  osteomyelitis. Will need vascular/podiatry input in AM - I will send a staff message to Dr. Annamary Rummage.. I will order mri (it will be delayed, as patient already got contrast for pelvic mri)   Advance Care Planning:   Code Status: Full Code   Consults: Wound care, registered dietitian  Family Communication: As per patient  Severity of Illness: The appropriate patient status for this patient is INPATIENT. Inpatient status is judged to be reasonable and necessary in order to provide the required intensity of service to ensure the patient's safety. The patient's presenting symptoms, physical exam findings, and initial radiographic and laboratory data in the context of their chronic comorbidities is felt to place them at high risk for further clinical deterioration. Furthermore, it is not anticipated that the patient will be medically stable for discharge from the hospital within 2 midnights of admission.   * I certify that at the point of admission it is my clinical judgment that the patient will require inpatient hospital care spanning beyond 2 midnights from the point of admission due to high intensity of service, high risk for further deterioration and high frequency of surveillance required.*  Author: Nolberto Hanlon, MD 05/22/2023 7:38 PM  For on call review [email or web address].

## 2023-05-22 NOTE — ED Triage Notes (Signed)
Patient to ED via POV from wound center. WC sent patient over for possible infection and possible admission. Wounds on right heel and right hip since January but worse over the last couple of weeks. Son report patient being more lethargic today compared to yesterday. Patient answering all questions appropriately at this time.

## 2023-05-22 NOTE — Consult Note (Signed)
PHARMACY -  BRIEF ANTIBIOTIC NOTE   Pharmacy has received consult(s) for vancomycin  from an ED provider.  The patient's profile has been reviewed for ht/wt/allergies/indication/available labs.    One time order(s) placed for  Vancomycin 1000 mg  Further antibiotics/pharmacy consults should be ordered by admitting physician if indicated.                       Thank you, Sharen Hones 05/22/2023  6:14 PM

## 2023-05-22 NOTE — Assessment & Plan Note (Signed)
Patient is sent in from her wound clinic for her chronic wounds due to concern of infection.  See representative photo as above.  Patient has a mixture of dry gangrene and chronic appearing wounds.  However the right medial ankle wound as pictured above has definitely some purulent drainage.  Spreading erythema as pictured.  Therefore this is cellulitis purulent.  Will treat with vancomycin and Zosyn as there is also some dry/wet appearing gangrene.  However there is no crepitus, joint involvement does not seem to be present.  Patient's other wounds do not seem grossly infected, although they do have some dry gangrene areas.  Patient has an MRI of the pelvis pending to rule out osteomyelitis in that area.  I will also get an x-ray of the right ankle.  I could not probe the bone directly in any of the wounds.

## 2023-05-22 NOTE — Assessment & Plan Note (Signed)
Has a diagnosis previously documented in the chart.  It is corroborated by my history with the patient today.  I suspect this is the reason for patient having formed pressure ulceration in so many areas.  I will request a nutrition evaluation

## 2023-05-22 NOTE — ED Notes (Signed)
1 set of cultures, grey, purple, and light green tubes sent to lab at this time.

## 2023-05-22 NOTE — Assessment & Plan Note (Signed)
Blood pressure seems to be soft at this time, will hold off on antihypertensive regimen.  Kind of restart same in the morning

## 2023-05-22 NOTE — Consult Note (Signed)
Pharmacy Antibiotic Note  [name] is a 82 y.o. female admitted on 05/22/2023 with worsening of chronic wounds. Imaging ordered. Pharmacy has been consulted for Vancomycin and Zosyn dosing.  Plan: Zosyn 3.375g IV q8h (4 hour infusion). Vancomycin 1000 mg x 1LD followed by Vancomycin 750 mg Q24H. Goal AUC 400-550 Estimated AUC 547/Cmin: 14.9 Scr 0.8, IBW, Vd 0.72   Height: 4\' 11"  (149.9 cm) Weight: 52.6 kg (115 lb 15.4 oz) IBW/kg (Calculated) : 43.2  Temp (24hrs), Avg:98.5 F (36.9 C), Min:98.5 F (36.9 C), Max:98.5 F (36.9 C)  Recent Labs  Lab 05/22/23 1627  WBC 13.9*  CREATININE 0.67  LATICACIDVEN 1.9    Estimated Creatinine Clearance: 41.6 mL/min (by C-G formula based on SCr of 0.67 mg/dL).    Allergies  Allergen Reactions   Other Anaphylaxis    Anesthisia has been a health issue for her in the past.    Sulfa Antibiotics Rash    Antimicrobials this admission: 7/10 Vancomycin >>  7/10 Zosyn >>   Dose adjustments this admission:   Microbiology results: 7/10 BCx: pending 7/10 Wound superficial Cx: pending   Thank you for allowing pharmacy to be a part of this patient's care.  Sharen Hones, PharmD, BCPS Clinical Pharmacist   05/22/2023 7:52 PM

## 2023-05-22 NOTE — ED Provider Notes (Signed)
[facility] Provider Note    Event Date/Time   First MD Initiated Contact with Patient 05/22/23 1754     (approximate)   History   Wound Infection   HPI  [name] is a 82 y.o. female   Past medical history of breast cancer status postmastectomy, diabetes, hyperlipidemia, hypertension, hypothyroid, anemia of chronic disease, peripheral artery disease, neuropathy, malnutrition, skin pressure ulcers, DVT history with no documented anticoagulation currently, here with worsening skin ulcers to R hip primarily, and bilateral lower extremities.   No fever, no chills. Worsening lethargy.   Here with son. Son says redness surrounding R hip wound is worsening and deeper wound. Wound care advised coming to hospital for abx and admission.   Independent Historian contributed to assessment above: Son at bedside corroborates information given above  External Medical Documents Reviewed: Discharge summary from March 2024 when she was admitted for worsening right hip wound and got IV antibiotics, found to have osteomyelitis, status postdebridement      Physical Exam   Triage Vital Signs: ED Triage Vitals  Enc Vitals Group     BP 05/22/23 1622 123/62     Pulse Rate 05/22/23 1622 97     Resp 05/22/23 1622 18     Temp 05/22/23 1619 98.5 F (36.9 C)     Temp Source 05/22/23 1619 Oral     SpO2 05/22/23 1622 99 %     Weight 05/22/23 1751 115 lb 15.4 oz (52.6 kg)     Height 05/22/23 1751 4\' 11"  (1.499 m)     Head Circumference --      Peak Flow --      Pain Score 05/22/23 1623 8     Pain Loc --      Pain Edu? --      Excl. in GC? --     Most recent vital signs: Vitals:   05/22/23 1619 05/22/23 1622  BP:  123/62  Pulse:  97  Resp:  18  Temp: 98.5 F (36.9 C)   SpO2:  99%    General: Awake, no distress.  CV:  Good peripheral perfusion.  Resp:  Normal effort. Abd:  No distention.  Other:  Chronically ill-appearing woman with a deep ulceration  to the right hip with surrounding cellulitic changes.  She also has wounds to the right calf and ulceration to left heel with minimal cellulitic changes, and a sacral decubitus ulcer stage I.   ED Results / Procedures / Treatments   Labs (all labs ordered are listed, but only abnormal results are displayed) Labs Reviewed  COMPREHENSIVE METABOLIC PANEL - Abnormal; Notable for the following components:      Result Value   Glucose, Bld 131 (*)    BUN 29 (*)    Calcium 8.3 (*)    Albumin 2.5 (*)    All other components within normal limits  CBC WITH DIFFERENTIAL/PLATELET - Abnormal; Notable for the following components:   WBC 13.9 (*)    RBC 2.99 (*)    Hemoglobin 7.7 (*)    HCT 25.5 (*)    MCH 25.8 (*)    RDW 18.0 (*)    Platelets 699 (*)    Neutro Abs 10.9 (*)    Abs Immature Granulocytes 0.29 (*)    All other components within normal limits  LACTIC ACID, PLASMA  LACTIC ACID, PLASMA     I ordered and reviewed the above labs they are notable for white blood cell count is  elevated 13.9.  Chronic anemia.    PROCEDURES:  Critical Care performed: No  Procedures   MEDICATIONS ORDERED IN ED: Medications  ceFEPIme (MAXIPIME) 2 g in sodium chloride 0.9 % 100 mL IVPB (has no administration in time range)    External physician / consultants:  I spoke with hospitalist for admission and regarding care plan for this patient.   IMPRESSION / MDM / ASSESSMENT AND PLAN / ED COURSE  I reviewed the triage vital signs and the nursing notes.                                Patient's presentation is most consistent with acute presentation with potential threat to life or bodily function.  Differential diagnosis includes, but is not limited to, osteomyelitis, cellulitis, pressure ulcers, sepsis   The patient is on the cardiac monitor to evaluate for evidence of arrhythmia and/or significant heart rate changes.  MDM:    Worsening wound to the right hip with deeper ulceration and  surrounding cellulitic changes.  Increased lethargy and increased white blood cell count concerning for active infection, started on IV vancomycin and cefepime.  No signs of sepsis, doubt sepsis now.  MRI ordered to assess for potential osteomyelitis.  Bilateral leg wounds and sacral decubitus ulcer does not appear actively infected currently.  Admission.       FINAL CLINICAL IMPRESSION(S) / ED DIAGNOSES   Final diagnoses:  Wound infection  Skin ulcer of sacrum, limited to breakdown of skin (HCC)     Rx / DC Orders   ED Discharge Orders     None        Note:  This document was prepared using Dragon voice recognition software and may include unintentional dictation errors.    Pilar Jarvis, MD 05/22/23 [phone]

## 2023-05-23 DIAGNOSIS — L03115 Cellulitis of right lower limb: Secondary | ICD-10-CM

## 2023-05-23 DIAGNOSIS — E43 Unspecified severe protein-calorie malnutrition: Secondary | ICD-10-CM

## 2023-05-23 DIAGNOSIS — L97512 Non-pressure chronic ulcer of other part of right foot with fat layer exposed: Secondary | ICD-10-CM

## 2023-05-23 DIAGNOSIS — L089 Local infection of the skin and subcutaneous tissue, unspecified: Secondary | ICD-10-CM | POA: Diagnosis not present

## 2023-05-23 DIAGNOSIS — I10 Essential (primary) hypertension: Secondary | ICD-10-CM

## 2023-05-23 DIAGNOSIS — E440 Moderate protein-calorie malnutrition: Secondary | ICD-10-CM

## 2023-05-23 DIAGNOSIS — D638 Anemia in other chronic diseases classified elsewhere: Secondary | ICD-10-CM | POA: Diagnosis not present

## 2023-05-23 DIAGNOSIS — T148XXA Other injury of unspecified body region, initial encounter: Secondary | ICD-10-CM | POA: Diagnosis not present

## 2023-05-23 DIAGNOSIS — R5381 Other malaise: Secondary | ICD-10-CM

## 2023-05-23 LAB — CBC
HCT: 21.7 % — ABNORMAL LOW (ref 36.0–46.0)
HCT: 24.7 % — ABNORMAL LOW (ref 36.0–46.0)
Hemoglobin: 6.7 g/dL — ABNORMAL LOW (ref 12.0–15.0)
Hemoglobin: 7.6 g/dL — ABNORMAL LOW (ref 12.0–15.0)
MCH: 25.7 pg — ABNORMAL LOW (ref 26.0–34.0)
MCH: 25.7 pg — ABNORMAL LOW (ref 26.0–34.0)
MCHC: 30.8 g/dL (ref 30.0–36.0)
MCHC: 30.9 g/dL (ref 30.0–36.0)
MCV: 83.1 fL (ref 80.0–100.0)
MCV: 83.4 fL (ref 80.0–100.0)
Platelets: 623 10*3/uL — ABNORMAL HIGH (ref 150–400)
Platelets: 712 10*3/uL — ABNORMAL HIGH (ref 150–400)
RBC: 2.61 MIL/uL — ABNORMAL LOW (ref 3.87–5.11)
RBC: 2.96 MIL/uL — ABNORMAL LOW (ref 3.87–5.11)
RDW: 18 % — ABNORMAL HIGH (ref 11.5–15.5)
RDW: 18 % — ABNORMAL HIGH (ref 11.5–15.5)
WBC: 12.4 10*3/uL — ABNORMAL HIGH (ref 4.0–10.5)
WBC: 13.9 10*3/uL — ABNORMAL HIGH (ref 4.0–10.5)
nRBC: 0 % (ref 0.0–0.2)
nRBC: 0 % (ref 0.0–0.2)

## 2023-05-23 LAB — CREATININE, SERUM
Creatinine, Ser: 0.69 mg/dL (ref 0.44–1.00)
GFR, Estimated: >60 mL/min (ref >60)

## 2023-05-23 LAB — BASIC METABOLIC PANEL
Anion gap: 8 (ref 5–15)
BUN: 19 mg/dL (ref 8–23)
CO2: 22 mmol/L (ref 22–32)
Calcium: 8.1 mg/dL — ABNORMAL LOW (ref 8.9–10.3)
Chloride: 107 mmol/L (ref 98–111)
Creatinine, Ser: 0.59 mg/dL (ref 0.44–1.00)
GFR, Estimated: >60 mL/min (ref >60)
Glucose, Bld: 104 mg/dL — ABNORMAL HIGH (ref 70–99)
Potassium: 3.7 mmol/L (ref 3.5–5.1)
Sodium: 137 mmol/L (ref 135–145)

## 2023-05-23 LAB — RETICULOCYTES
Immature Retic Fract: 38.1 % — ABNORMAL HIGH (ref 2.3–15.9)
RBC.: 3.03 MIL/uL — ABNORMAL LOW (ref 3.87–5.11)
Retic Count, Absolute: 90.9 10*3/uL (ref 19.0–186.0)
Retic Ct Pct: 3 % (ref 0.4–3.1)

## 2023-05-23 LAB — IRON AND TIBC
Iron: 12 ug/dL — ABNORMAL LOW (ref 28–170)
Saturation Ratios: 5 % — ABNORMAL LOW (ref 10.4–31.8)
TIBC: 253 ug/dL (ref 250–450)
UIBC: 241 ug/dL

## 2023-05-23 LAB — VITAMIN B12: Vitamin B-12: 618 pg/mL (ref 180–914)

## 2023-05-23 LAB — GLUCOSE, CAPILLARY
Glucose-Capillary: 158 mg/dL — ABNORMAL HIGH (ref 70–99)
Glucose-Capillary: 93 mg/dL (ref 70–99)
Glucose-Capillary: 176 mg/dL — ABNORMAL HIGH (ref 70–99)
Glucose-Capillary: 140 mg/dL — ABNORMAL HIGH (ref 70–99)

## 2023-05-23 LAB — HEMOGLOBIN AND HEMATOCRIT, BLOOD
HCT: 22.9 % — ABNORMAL LOW (ref 36.0–46.0)
Hemoglobin: 6.9 g/dL — ABNORMAL LOW (ref 12.0–15.0)

## 2023-05-23 LAB — PROTIME-INR
INR: 1.2 (ref 0.8–1.2)
Prothrombin Time: 15.5 seconds — ABNORMAL HIGH (ref 11.4–15.2)

## 2023-05-23 LAB — FOLATE: Folate: 11.3 ng/mL (ref >5.9)

## 2023-05-23 LAB — PHOSPHORUS: Phosphorus: 2.6 mg/dL (ref 2.5–4.6)

## 2023-05-23 LAB — FERRITIN: Ferritin: 279 ng/mL (ref 11–307)

## 2023-05-23 LAB — APTT: aPTT: 36 seconds (ref 24–36)

## 2023-05-23 MED ORDER — ADULT MULTIVITAMIN W/MINERALS CH
1.0000 | ORAL_TABLET | Freq: Every day | ORAL | Status: DC
  Administered 2023-05-23 – 2023-05-31 (×9): 1 via ORAL
  Filled 2023-05-23 (×9): qty 1

## 2023-05-23 MED ORDER — ENSURE ENLIVE PO LIQD
237.0000 mL | Freq: Three times a day (TID) | ORAL | Status: DC
  Administered 2023-05-23 – 2023-05-30 (×21): 237 mL via ORAL

## 2023-05-23 MED ORDER — ZINC SULFATE 220 (50 ZN) MG PO CAPS
220.0000 mg | ORAL_CAPSULE | Freq: Every day | ORAL | Status: DC
  Administered 2023-05-23 – 2023-05-31 (×9): 220 mg via ORAL
  Filled 2023-05-23 (×9): qty 1

## 2023-05-23 MED ORDER — CHLORHEXIDINE GLUCONATE CLOTH 2 % EX PADS
6.0000 | MEDICATED_PAD | Freq: Every day | CUTANEOUS | Status: DC
  Administered 2023-05-23 – 2023-05-30 (×8): 6 via TOPICAL

## 2023-05-23 MED ORDER — VITAMIN C 500 MG PO TABS
500.0000 mg | ORAL_TABLET | Freq: Two times a day (BID) | ORAL | Status: DC
  Administered 2023-05-23 – 2023-05-31 (×17): 500 mg via ORAL
  Filled 2023-05-23 (×17): qty 1

## 2023-05-23 MED ORDER — SODIUM CHLORIDE 0.9 % IV SOLN
200.0000 mg | INTRAVENOUS | Status: AC
  Administered 2023-05-23 – 2023-05-25 (×3): 200 mg via INTRAVENOUS
  Filled 2023-05-23 (×3): qty 200

## 2023-05-23 MED ORDER — VANCOMYCIN HCL IN DEXTROSE 1-5 GM/200ML-% IV SOLN
1000.0000 mg | INTRAVENOUS | Status: DC
  Administered 2023-05-24 – 2023-05-27 (×3): 1000 mg via INTRAVENOUS
  Filled 2023-05-23 (×3): qty 200

## 2023-05-23 NOTE — Progress Notes (Signed)
Initial Nutrition Assessment  DOCUMENTATION CODES:   Severe malnutrition in context of chronic illness  INTERVENTION:   -Liberalize diet to regular for widest variety of meal selections -Ensure Enlive po TID, each supplement provides 350 kcal and 20 grams of protein -MVI with minerals daily -500 mg vitamin C BID -220 mg zinc sulfate daily -Check and monitor/ replete for potential micronutrient deficiencies related to wound healing: vitamin A and vitamin E  NUTRITION DIAGNOSIS:   Severe Malnutrition related to chronic illness (DM) as evidenced by moderate muscle depletion, severe muscle depletion, moderate fat depletion, severe fat depletion, percent weight loss.  GOAL:   Patient will meet greater than or equal to 90% of their needs  MONITOR:   PO intake, Supplement acceptance  REASON FOR ASSESSMENT:   Consult Assessment of nutrition requirement/status  ASSESSMENT:   Pt with medical history significant of several prior known ulceration of bony prominences on the body.  Patient reports these have been present since approximately January.  Patient follows up at wound care clinic. On day of admission, pt was seen at wound clinic where wounds were felt to be worsening and possibly infected.  Pt admitted with bilateral heel wounds and cellulitis.   Reviewed I/O's: -95 ml x 24 hours  UOP: 300 ml x 24 hours   Per [facility]OCN note on 05/22/23, pt with unstageable pressure injuries to sacrum and lt hip, stage 3 pressure injuries to bilateral heels, full thickness wound to rt medial lower extremities, stage 4 pressure injury to rt hip, and partial thickness skin loss to bilateral lower extremities.   Podiatry has assessed pt and recommends MRI to rule out osteomyelitis, ABIs, PVRS, and vascular consult.   Spoke with pt at bedside, who was asleep, but quickly arose to touch. Pt familiar to this RD from multiple prior admissions; she appears more thin and frail in comparison to RD's  recollection of pt from prior visits. Observed lunch tray at bedside; pt consumed 100% of broccoli, 50% of mashed potatoes, 25% of chicken breast, and 75% of salad. Pt with no teeth, but denies difficulty chewing hospital food. She politely declined offer for mechanically altered diet for ease of intake. Pt reports appetite I fair. She generally consumes 3 meals per day (Breakfast: cereal; Lunch and Dinner: meat, starch, and vegetable). Pt also consumes 3 protein shakes daily, but is unsure pf the name of it. Pt also endorses vitamin use at home, but is unsure of specifics of this ("I take whatever they tell me to").   Pt endorses progressive wt loss since January 2024. She estimates she has lost 30 pounds over the past 6 months. Reviewed wt hx; pt has experienced a 15.4% wt loss over the past 4 months, which is significant for time frame.   Discussed importance of good meal and supplement intake to promote healing. Pt amenable to vitamins and supplements. As pt with chronic, non-healing wounds, will draw labs to assess for potential micronutrient deficiencies that may further impair wound healing.   Medications reviewed and include santyl, vitamin B-12, lovenox, synthroid, miralax, and thiamine.   Lab Results  Component Value Date   HGBA1C 6.1 (H) 12/19/2022   PTA DM medications are 50 mg januvia daily and 1000 mg metformin daily.   Labs reviewed: CBGS: 93-158 (inpatient orders for glycemic control are 0-5 units insulin aspart daily at bedtime and 0-9 units insulin aspart TID with meals).    NUTRITION - FOCUSED PHYSICAL EXAM:  Flowsheet Row Most Recent Value  Orbital Region  Moderate depletion  Upper Arm Region Severe depletion  Thoracic and Lumbar Region Moderate depletion  Buccal Region Severe depletion  Temple Region Moderate depletion  Clavicle Bone Region Severe depletion  Clavicle and Acromion Bone Region Severe depletion  Scapular Bone Region Severe depletion  Dorsal Hand Moderate  depletion  Patellar Region Severe depletion  Anterior Thigh Region Severe depletion  Posterior Calf Region Severe depletion  Edema (RD Assessment) None  Hair Reviewed  Eyes Reviewed  Mouth Reviewed  Skin Reviewed  Nails Reviewed       Diet Order:   Diet Order             Diet Heart Room service appropriate? Yes; Fluid consistency: Thin  Diet effective now                   EDUCATION NEEDS:   Education needs have been addressed  Skin:  Skin Assessment: Skin Integrity Issues: Skin Integrity Issues:: Stage IV, Stage III, Unstageable, Other (Comment) Stage III: bilateral heels Stage IV: rt hip Unstageable: sacrum, lt hip Other: full thickness wounds to rt medial lower extremities, partial thickness skin loss to bilateral lower extremities  Last BM:  Unknown  Height:   Ht Readings from Last 1 Encounters:  05/22/23 4\' 11"  (1.499 m)    Weight:   Wt Readings from Last 1 Encounters:  05/22/23 44.5 kg    Ideal Body Weight:  44.7 kg  BMI:  Body mass index is 19.79 kg/m.  Estimated Nutritional Needs:   Kcal:  1750-1950  Protein:  90-105 grams  Fluid:  > 1.7 L    Levada Schilling, RD, LDN, CDCES Registered Dietitian II Certified Diabetes Care and Education Specialist Please refer to [facility] for RD and/or RD on-call/weekend/after hours pager

## 2023-05-23 NOTE — Progress Notes (Signed)
Patient admitted to floor. Alert and oriented x4. While assessing wound this RN and Lennox Laity, RN noted multiple maggots in patients Right hip wound. Area cleaned and dressing reapplied. Dr. Nolberto Hanlon notified. All of patients wounds assessed and dressing applied Measurements noted. See wounds in flowsheet. Patient is resting in bed at this time. Son is at bedside. Needs met at this time. Patient only belongings are clothes. Patient refused toi put on hospital gown, patient wants to keep her own shirts on at this time. Patient oriented to room, Bed in lowest position. Call light within reach.

## 2023-05-23 NOTE — Consult Note (Signed)
  Subjective:  Patient ID: [name], female    DOB: 11-17-1940,  MRN: [id number]  A 82 y.o. female  medical history significant of several prior known ulceration of bony prominences on the body.  To bilateral heel and right medial ankle.  They have been present for quite some time they appear to be very chronic in nature the patient was primarily being managed at the wound care center.  Podiatry was consulted to get it evaluated.  No recent vascular study. Objective:   Vitals:   05/23/23 0407 05/23/23 0851  BP: (!) 111/59 (!) 97/53  Pulse: 85 85  Resp: 16 16  Temp: 99.1 F (37.3 C) 98.2 F (36.8 C)  SpO2: 100% 99%   General AA&O x3. Normal mood and affect.  Vascular Dorsalis pedis and posterior tibial pulses faintly palpable Brisk capillary refill to all digits. Pedal hair not present  Neurologic Epicritic sensation grossly intact.  Dermatologic Multiple chronic wounds noted to bilateral heel and medial ankle.  Right side appears to be worse than left side none of them probing down to bone no malodor present cellulitis/erythema noted.  Orthopedic: MMT 5/5 in dorsiflexion, plantarflexion, inversion, and eversion. Normal joint ROM without pain or crepitus.         Assessment & Plan:  Patient was evaluated and treated and all questions answered.  Bilateral heel wounds with right medial ankle wound not probing down to deep bone -All questions and concerns were discussed with the patient in extensive detail -Patient will benefit from an MRI evaluation to rule out osteomyelitis.  especially to the right side as it appears to be more concerning.  The left side appears to be more stable.   -Patient will also benefit from ABIs PVRs and vascular consult -No acute intervention from podiatry at this time -Betadine wet-to-dry dressing -Podiatry to continue following  Candelaria Stagers, DPM  Accessible via secure chat for questions or concerns.

## 2023-05-23 NOTE — Progress Notes (Addendum)
  Progress Note   Patient: [name] WUJ:[id number] DOB: October 13, 1941 DOA: 05/22/2023     1 DOS: the patient was seen and examined on 05/23/2023   Brief hospital course: [name] is a 82 y.o. 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  Patient reports these have been present since approximately January.  Patient follows up at wound care clinic.  Patient was seen for a routine evaluation today.  Patient's wounds were felt to be worsening and possibly infected.   Assessment and Plan: * Cellulitis- multiple open wounds on extremities Stage 3-4 ulcers over heels. Sacrum, left hip, lower extremities. Patient is sent in from her wound clinic for her chronic wounds due to concern of infection.  Patient has a mixture of dry gangrene and chronic appearing wounds.  However the right medial ankle wound as pictured above has definitely some purulent drainage.  Spreading erythema as pictured.   Continue with vancomycin and Zosyn as there is also some dry/wet appearing gangrene.   Will follow MRI of the pelvis pending to rule out osteomyelitis. Podiatry evaluation. Further management per imaging studies, podiatry team.  Anemia of chronic disease This is a chronic diagnosis.  Iron level low. Will give IV iron therapy and oral upon discharge. Continue to monitor H/H. Will transfuse if Hb less than 7.  Malnutrition of Severe degree Has a diagnosis previously documented in the chart.  She has skin ulcers, BMI 19. Nutrition evaluation appreciated.  Hypertension Blood pressure lower side, caution with antihypertensives.  Diabetes mellitus, type 2 (HCC) Continue accucheks, insulin sliding scale        Subjective: Patient seen and examined today morning.  She is lying in the bed, very weak, debilitated.  Unable to provide me any history.  No family member at bedside.  Physical Exam: Vitals:   05/22/23 2241 05/22/23  2243 05/23/23 0407 05/23/23 0851  BP: 116/64  (!) 111/59 (!) 97/53  Pulse: 94  85 85  Resp: 18  16 16   Temp: 99.4 F (37.4 C)  99.1 F (37.3 C) 98.2 F (36.8 C)  TempSrc: Oral  Oral Oral  SpO2: 96%  100% 99%  Weight:  44.5 kg    Height:  4\' 11"  (1.499 m)     General - Elderly cachectic Caucasian female, no apparent distress HEENT - PERRLA, EOMI, atraumatic head, non tender sinuses. Lung - Clear, rales, rhonchi, wheezes. Heart - S1, S2 heard, no murmurs, rubs, trace pedal edema Neuro -sleepy, lethargic, unable to do full neuro exam. Skin - Warm and dry.  Multiple skin ulcers as noted above Data Reviewed:  CBC, BMP, PT/INR, blood sugars, anemia profile  Family Communication: Updated husband regarding current care, advised goals of care discussion. He understands  Disposition: Status is: Inpatient Remains inpatient appropriate because: multiple skin ulcers need podiatry, vascular evaluation.  Planned Discharge Destination: Skilled nursing facility vs home health    Time spent: 46 minutes  Author: Marcelino Duster, MD 05/23/2023 4:14 PM  For on call review [email or web address].

## 2023-05-23 NOTE — TOC Initial Note (Addendum)
Transition of Care [facility]) - Initial/Assessment Note    Patient Details  Name: [name] MRN: [id number] Date of Birth: 02/04/1941  [facility] CM/SW Contact:    Margarito Liner, LCSW Phone Number: 05/23/2023, 10:57 AM  Clinical Narrative:   Readmission prevention screen complete. CSW met with patient. No supports at bedside. CSW introduced role and explained that discharge planning would be discussed. PCP is Dr. Marcello Fennel. Husband transports her to appointments. She uses [employer]. No issues obtaining medications. Patient lives home with her husband. No home health prior to admission. She has a wheelchair, hospital bed, and built-in shower chair. No further concerns. CSW encouraged patient to contact CSW as needed. CSW will continue to follow patient for support and facilitate return home once stable. Her husband will transport her home at discharge.               12:53 pm: Patient declined SNF but is agreeable to home health. No agency preference. CSW called her husband as well to notify. He is agreeable to plan as well. CSW will start home health search.  2:18 pm: [facility], [facility], [facility], [facility], [facility], and [facility] declined referral. Left messages for [facility], [facility], and [facility].  3:12 pm: [facility], [facility] and [facility] declined referral. CSW called husband to notify and asked if he wanted to try to pursue SNF or bring her home as before. He asked if we could send her home on antibiotics. CSW spoke to [facility] who stated they had discharged her because they were not able to progress her. MD is aware and will consult palliative for goals of care.  Expected Discharge Plan: Home/Self Care Barriers to Discharge: Continued Medical Work up   Patient Goals and CMS Choice            Expected Discharge Plan and Services     Post Acute Care Choice: NA Living arrangements for the past 2 months: Single Family Home                                       Prior Living Arrangements/Services Living arrangements for the past 2 months: Single Family Home Lives with:: Spouse Patient language and need for interpreter reviewed:: Yes Do you feel safe going back to the place where you live?: Yes      Need for Family Participation in Patient Care: Yes (Comment) Care giver support system in place?: Yes (comment) Current home services: DME Criminal Activity/Legal Involvement Pertinent to Current Situation/Hospitalization: No - Comment as needed  Activities of Daily Living Home Assistive Devices/Equipment: Hospital bed, Wheelchair ADL Screening (condition at time of admission) Patient's cognitive ability adequate to safely complete daily activities?: Yes Is the patient deaf or have difficulty hearing?: Yes Does the patient have difficulty seeing, even when wearing glasses/contacts?: No Does the patient have difficulty concentrating, remembering, or making decisions?: No Patient able to express need for assistance with ADLs?: No Does the patient have difficulty dressing or bathing?: Yes Independently performs ADLs?: No Communication: Independent Dressing (OT): Dependent Is this a change from baseline?: Pre-admission baseline Grooming: Needs assistance Is this a change from baseline?: Pre-admission baseline Feeding: Independent Bathing: Dependent Is this a change from baseline?: Pre-admission baseline Toileting: Dependent Is this a change from baseline?: Pre-admission baseline In/Out Bed: Dependent Is this a change from baseline?: Pre-admission baseline Walks in Home: Dependent Is this a change from baseline?: Pre-admission  baseline Does the patient have difficulty walking or climbing stairs?: Yes Weakness of Legs: Both Weakness of Arms/Hands: None  Permission Sought/Granted                  Emotional Assessment Appearance:: Appears stated age Attitude/Demeanor/Rapport: Engaged, Gracious Affect (typically observed):  Accepting, Appropriate, Calm, Pleasant Orientation: : Oriented to Self, Oriented to Place, Oriented to  Time, Oriented to Situation Alcohol / Substance Use: Not Applicable Psych Involvement: No (comment)  Admission diagnosis:  Cellulitis [L03.90] Wound infection [T14.8XXA, L08.9] Skin ulcer of sacrum, limited to breakdown of skin (HCC) [L98.421] Patient Active Problem List   Diagnosis Date Noted   Cellulitis 05/22/2023   General weakness 01/31/2023   Osteomyelitis of right hip (HCC) 01/13/2023   Hypoalbuminemia 01/10/2023   Decubitus ulcer of trochanter, right, stage III (HCC) 01/06/2023   Wound infection 01/05/2023   Severe sepsis (HCC) 01/05/2023   Anemia of chronic disease 01/05/2023   Fever 12/21/2022   Leukocytosis 12/20/2022   NSTEMI (non-ST elevated myocardial infarction) (HCC) 12/19/2022   Lower urinary tract infectious disease 12/19/2022   Type II diabetes mellitus with renal manifestations (HCC) 12/19/2022   CKD (chronic kidney disease) stage 2, GFR 60-89 ml/min 12/19/2022   DVT (deep venous thrombosis) (HCC) 12/19/2022   Ulcer of both feet, limited to breakdown of skin (HCC) 12/19/2022   Reactive thrombocytosis 11/04/2022   Acute deep vein thrombosis (DVT) of femoral vein of left lower extremity (HCC) 11/02/2022   Chronic ulcer of great toe of left foot with fat layer exposed (HCC) 11/02/2022   Anemia due to stage 3a chronic kidney disease (HCC) 11/02/2022   Acute on chronic urinary retention 11/02/2022   Thrombus 11/02/2022   Pressure ulcers of skin of multiple topographic sites 08/23/2022   Acute metabolic encephalopathy 08/22/2022   Sepsis (HCC) 08/22/2022   Acute diverticulitis 08/22/2022   Hydroureteronephrosis 08/22/2022   Hypokalemia 08/22/2022   Malnutrition of moderate degree 08/18/2022   Cellulitis of lower extremity 08/17/2022   Cellulitis of both lower extremities 08/16/2022   Diabetes mellitus without complication (HCC)    Gout    Chronic diastolic  CHF (congestive heart failure) (HCC)    Iron deficiency anemia    Diarrhea    Cervical spondylosis 03/09/2020   Lower limb ulcer, ankle, right, limited to breakdown of skin (HCC) 09/09/2018   Chronic gouty arthritis 04/29/2018   Generalized osteoarthritis of hand 02/12/2018   Hyperuricemia 02/12/2018   Swelling of finger 02/12/2018   PAD (peripheral artery disease) (HCC) 04/30/2017   Lymphedema 04/30/2017   Bilateral edema of lower extremity 04/03/2017   Ecchymosis 04/03/2017   Leg pain, bilateral 04/03/2017   Acquired hypothyroidism 01/24/2017   Benign essential hypertension 01/24/2017   History of mastectomy, right 01/24/2017   Primary osteoarthritis of left knee 02/29/2016   Cancer of right breast (HCC) 04/17/2015   Chronic pain 03/03/2015   Chronic pain of both knees 03/03/2015   Arthritis 01/11/2014   Soft tissue lesion of shoulder region 01/11/2014   Diabetes mellitus, type 2 (HCC) 01/11/2014   H/O renal calculi 01/11/2014   Hypertension 01/11/2014   HLD (hyperlipidemia) 01/11/2014   Neuropathy 01/11/2014   OP (osteoporosis) 01/11/2014   H/O malignant neoplasm of breast 01/11/2014   PCP:  Barbette Reichmann, MD Pharmacy:   [facility] - [place], [place] - [address] [place] [address] Phone: [phone] Fax: [phone]  [id number] - [place], [place] - [address] [place] [address] AT [facility] OF  [address] [facility] [place] [address] Phone: [phone] Fax: [phone]  [facility] #[id number] [place], [place] - [address] AT [facility] OF [address] [place] [address] Phone: [phone] Fax: [phone]     Social Determinants of Health (SDOH) Social History: SDOH Screenings   Food Insecurity: No Food Insecurity (05/22/2023)  Housing: Low Risk  (05/22/2023)  Transportation Needs: No Transportation Needs (05/22/2023)  Utilities: Not At Risk (05/22/2023)   Depression (PHQ2-9): Low Risk  (03/28/2023)  Tobacco Use: Low Risk  (05/22/2023)   SDOH Interventions:     Readmission Risk Interventions    05/23/2023   10:56 AM 12/24/2022   11:39 AM 11/07/2022    4:05 PM  Readmission Risk Prevention Plan  Transportation Screening Complete Complete Complete  Medication Review (RN Care Manager) Complete Complete Complete  PCP or Specialist appointment within 3-5 days of discharge Complete    HRI or Home Care Consult  Complete   SW Recovery Care/Counseling Consult Complete    Palliative Care Screening Not Applicable    Skilled Nursing Facility Not Applicable Not Applicable Complete

## 2023-05-23 NOTE — Consult Note (Signed)
Pharmacy Antibiotic Note  [name] is a 82 y.o. female admitted on 05/22/2023 with worsening of chronic wounds. Imaging ordered. Pharmacy has been consulted for Vancomycin and Zosyn dosing.  Plan:  1) continue Zosyn 3.375g IV q8h (4 hour infusion).  2) adjust vancomycin to 1000 mg IV every 36 hours Goal AUC 400-550 Estimated AUC 5461/Cmin: 11.6 Scr 0.8 (rounded up), IBW, Vd 0.72   Height: 4\' 11"  (149.9 cm) Weight: 44.5 kg (98 lb) IBW/kg (Calculated) : 43.2  Temp (24hrs), Avg:99 F (37.2 C), Min:98.5 F (36.9 C), Max:99.4 F (37.4 C)  Recent Labs  Lab 05/22/23 1627 05/22/23 1928 05/22/23 2342 05/23/23 0538  WBC 13.9*  --  13.9* 12.4*  CREATININE 0.67  --  0.69 0.59  LATICACIDVEN 1.9 2.5*  --   --     Estimated Creatinine Clearance: 38.3 mL/min (by C-G formula based on SCr of 0.59 mg/dL).    Allergies  Allergen Reactions   Other Anaphylaxis    Anesthisia has been a health issue for her in the past.    Sulfa Antibiotics Rash    Antimicrobials this admission: 7/10 vancomycin >>  7/10 Zosyn >>   Microbiology results: 7/10 BCx: NGTD 7/10 Wound superficial Cx: pending   Thank you for allowing pharmacy to be a part of this patient's care.  Lowella Bandy, PharmD, BCPS Clinical Pharmacist   05/23/2023 8:02 AM

## 2023-05-23 NOTE — Evaluation (Signed)
Occupational Therapy Evaluation Patient Details Name: [name] MRN: [id number] DOB: [birth year] Today's Date: 05/23/2023   History of Present Illness [name] is a 82 y.o. 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  Patient reports these have been present since approximately January.  Patient follows up at wound care clinic.  Patient was seen for a routine evaluation today.  Patient's wounds were felt to be worsening and possibly infected.  Therefore patient is sent to the ER today.   Clinical Impression   Pt seen for OT evaluation. Pt unable to tolerate much, limited by pain and BLE knee flexion contractures. Pt requires MAX A for limited bed mobility, and near TOTAL A for bed level bathing, dressing, and toileting tasks. Pt declined EOB attempts 2/2 significant pain from pressure wounds (R hip was the worse). Pt may benefit from skilled OT services to address impairments and work towards improving ADL and mobility safety and decreased caregiver assist required.     Recommendations for follow up therapy are one component of a multi-disciplinary discharge planning process, led by the attending physician.  Recommendations may be updated based on patient status, additional functional criteria and insurance authorization.   Assistance Recommended at Discharge Frequent or constant Supervision/Assistance  Patient can return home with the following A lot of help with walking and/or transfers;Assistance with cooking/housework;Assist for transportation;Help with stairs or ramp for entrance;A lot of help with bathing/dressing/bathroom;Direct supervision/assist for medications management    Functional Status Assessment  Patient has had a recent decline in their functional status and demonstrates the ability to make significant improvements in function in a reasonable and predictable amount of time.  Equipment  Recommendations  Other (comment) (defer, may benefit from pressure relief hospital bed)    Recommendations for Other Services       Precautions / Restrictions Precautions Precautions: Fall Restrictions Weight Bearing Restrictions: No Other Position/Activity Restrictions: many significant pressure wounds      Mobility Bed Mobility Overal bed mobility: Needs Assistance Bed Mobility: Rolling Rolling: Max assist         General bed mobility comments: patient with limited tolerance for mobility due to pain ( wounds)    Transfers                   General transfer comment: not attempted but patient will need max to total A      Balance                                           ADL either performed or assessed with clinical judgement   ADL                                         General ADL Comments: Pt currently requires near TOTAL A for bed level bathing, dressing, and toileting. MIN A for grooming from bed level.     Vision         Perception     Praxis      Pertinent Vitals/Pain Pain Assessment Pain Assessment: 0-10 Pain Score: 6  Pain Location: wounds Pain Descriptors / Indicators: Discomfort, Grimacing, Guarding, Sore Pain Intervention(s): Monitored during session, Limited activity within patient's tolerance     Hand Dominance Right  Extremity/Trunk Assessment Upper Extremity Assessment Upper Extremity Assessment: Generalized weakness;LUE deficits/detail LUE Deficits / Details: hx shoulder injury resulting in shoulder flexion ROM <45*   Lower Extremity Assessment Lower Extremity Assessment: Defer to PT evaluation;RLE deficits/detail;LLE deficits/detail RLE Deficits / Details: knee flexion contracture RLE: Unable to fully assess due to pain RLE Coordination: decreased gross motor LLE Deficits / Details: knee flexion contracture LLE: Unable to fully assess due to pain LLE Coordination: decreased gross  motor       Communication Communication Communication: HOH   Cognition Arousal/Alertness: Awake/alert Behavior During Therapy: WFL for tasks assessed/performed Overall Cognitive Status: Within Functional Limits for tasks assessed                                       General Comments       Exercises     Shoulder Instructions      Home Living Family/patient expects to be discharged to:: Skilled nursing facility Living Arrangements: Spouse/significant other                               Additional Comments: Pt reports she lives at [facility], which is an ALF that she & her husband own, but they are planning to sell.      Prior Functioning/Environment Prior Level of Function : Needs assist  Cognitive Assist : ADLs (cognitive)     Physical Assist : Mobility (physical);ADLs (physical) Mobility (physical): Bed mobility;Transfers;Gait;Stairs ADLs (physical): Grooming;Bathing;Dressing;Toileting;IADLs Mobility Comments: Uses wheelchair for ambulation, husband provides assist for stand pivot transfers ADLs Comments: Pt reports requiring significant assist for all aspects of toileting, bathing, and dressing from spouse. Able to self feed.        OT Problem List: Decreased strength;Decreased range of motion;Pain;Decreased activity tolerance;Impaired balance (sitting and/or standing);Decreased knowledge of use of DME or AE;Impaired UE functional use      OT Treatment/Interventions: Self-care/ADL training;Therapeutic exercise;Therapeutic activities;DME and/or AE instruction;Patient/family education;Manual therapy;Balance training    OT Goals(Current goals can be found in the care plan section) Acute Rehab OT Goals Patient Stated Goal: have less pain OT Goal Formulation: With patient Time For Goal Achievement: 06/06/23 Potential to Achieve Goals: Fair ADL Goals Pt Will Perform Grooming: with set-up;with min guard  assist;sitting Additional ADL Goal #1: Pt will complete rolling bed mobility requiring MOD A for bed level ADL and pressure relief, 3/3 opportunities.  OT Frequency: Min 1X/week    Co-evaluation              AM-PAC OT "6 Clicks" Daily Activity     Outcome Measure Help from another person eating meals?: None Help from another person taking care of personal grooming?: A Little Help from another person toileting, which includes using toliet, bedpan, or urinal?: Total Help from another person bathing (including washing, rinsing, drying)?: A Lot Help from another person to put on and taking off regular upper body clothing?: A Lot Help from another person to put on and taking off regular lower body clothing?: Total 6 Click Score: 13   End of Session    Activity Tolerance: Patient limited by pain Patient left: in bed;with call bell/phone within reach;with bed alarm set  OT Visit Diagnosis: Other abnormalities of gait and mobility (R26.89);Muscle weakness (generalized) (M62.81);Pain Pain - Right/Left: Right Pain - part of body: Hip  Time: 1610-9604 OT Time Calculation (min): 10 min Charges:  OT General Charges $OT Visit: 1 Visit OT Evaluation $OT Eval Low Complexity: 1 Low  Arman Filter., MPH, MS, OTR/L ascom [phone] 05/23/23, 11:28 AM

## 2023-05-23 NOTE — Evaluation (Signed)
Physical Therapy Evaluation Patient Details Name: [name] MRN: [id number] DOB: [birth year] Today's Date: 05/23/2023  History of Present Illness  [name] is a 82 y.o. 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  Patient reports these have been present since approximately January.  Patient follows up at wound care clinic.  Patient was seen for a routine evaluation today.  Patient's wounds were felt to be worsening and possibly infected.  Therefore patient is sent to the ER today.   Clinical Impression  Patient received in bed, requesting to assist with stretching out right LE. Patient has B knee flexion contractures and multiple wounds on B LEs, hips, sacrum. Patient reports she can transfer with assist to a wheelchair. Not ambulatory. Mobility is limited by pain at this time. Patient will continue to benefit from skilled PT to improve mobility as able for maximal independence and to promote change in position to promote wound healing.         Assistance Recommended at Discharge Frequent or constant Supervision/Assistance  If plan is discharge home, recommend the following:  Can travel by private vehicle  A lot of help with bathing/dressing/bathroom;Two people to help with walking and/or transfers;Assist for transportation   No    Equipment Recommendations None recommended by PT  Recommendations for Other Services       Functional Status Assessment Patient has had a recent decline in their functional status and/or demonstrates limited ability to make significant improvements in function in a reasonable and predictable amount of time     Precautions / Restrictions Precautions Precautions: Fall Restrictions Weight Bearing Restrictions: No      Mobility  Bed Mobility Overal bed mobility: Needs Assistance Bed Mobility: Rolling Rolling: Max assist         General bed mobility comments: patient  with limited tolerance for mobility due to pain ( wounds)    Transfers Overall transfer level: Needs assistance                 General transfer comment: not attempted but patient will need max to total A    Ambulation/Gait               General Gait Details: non-ambulatory  Stairs            Wheelchair Mobility     Tilt Bed    Modified Rankin (Stroke Patients Only)       Balance                                             Pertinent Vitals/Pain Pain Assessment Pain Assessment: 0-10 Pain Score: 6  Pain Location: wounds Pain Descriptors / Indicators: Discomfort, Grimacing, Guarding, Sore Pain Intervention(s): Monitored during session, Limited activity within patient's tolerance    Home Living Family/patient expects to be discharged to:: Skilled nursing facility Living Arrangements: Spouse/significant other                 Additional Comments: Pt reports she lives at [facility], which is an ALF that she & her husband own, but they are planning to sell.    Prior Function Prior Level of Function : Needs assist  Cognitive Assist : ADLs (cognitive)     Physical Assist : Mobility (physical);ADLs (physical) Mobility (physical): Bed mobility;Transfers;Gait;Stairs ADLs (physical): Grooming;Bathing;Dressing;Toileting;IADLs Mobility Comments: Uses  wheelchair for ambulation, husband provides assist for stand pivot transfers       Hand Dominance   Dominant Hand: Right    Extremity/Trunk Assessment   Upper Extremity Assessment Upper Extremity Assessment: Defer to OT evaluation    Lower Extremity Assessment Lower Extremity Assessment: RLE deficits/detail;LLE deficits/detail RLE Deficits / Details: knee flexion contracture RLE: Unable to fully assess due to pain RLE Coordination: decreased gross motor LLE Deficits / Details: knee flexion contracture LLE: Unable to fully assess due to pain LLE  Coordination: decreased gross motor       Communication   Communication: HOH  Cognition Arousal/Alertness: Awake/alert Behavior During Therapy: WFL for tasks assessed/performed Overall Cognitive Status: Within Functional Limits for tasks assessed                                          General Comments      Exercises     Assessment/Plan    PT Assessment Patient needs continued PT services  PT Problem List Decreased strength;Decreased mobility;Decreased range of motion;Pain;Decreased skin integrity;Decreased activity tolerance;Decreased balance       PT Treatment Interventions Functional mobility training;Therapeutic activities;Patient/family education    PT Goals (Current goals can be found in the Care Plan section)  Acute Rehab PT Goals Patient Stated Goal: to decrease pain PT Goal Formulation: With patient Time For Goal Achievement: 06/06/23 Potential to Achieve Goals: Fair    Frequency Min 1X/week     Co-evaluation               AM-PAC PT "6 Clicks" Mobility  Outcome Measure Help needed turning from your back to your side while in a flat bed without using bedrails?: Total Help needed moving from lying on your back to sitting on the side of a flat bed without using bedrails?: Total Help needed moving to and from a bed to a chair (including a wheelchair)?: Total Help needed standing up from a chair using your arms (e.g., wheelchair or bedside chair)?: Total Help needed to walk in hospital room?: Total Help needed climbing 3-5 steps with a railing? : Total 6 Click Score: 6    End of Session   Activity Tolerance: Patient limited by pain Patient left: in bed;with call bell/phone within reach Nurse Communication: Mobility status;Patient requests pain meds PT Visit Diagnosis: Other abnormalities of gait and mobility (R26.89);Muscle weakness (generalized) (M62.81);Pain Pain - Right/Left: Right (Bilateral) Pain - part of body: Leg;Ankle and  joints of foot;Hip    Time: 1610-9604 PT Time Calculation (min) (ACUTE ONLY): 10 min   Charges:   PT Evaluation $PT Eval Moderate Complexity: 1 Mod   PT General Charges $$ ACUTE PT VISIT: 1 Visit         Giavonni Cizek, PT, GCS 05/23/23,11:20 AM

## 2023-05-23 NOTE — Plan of Care (Signed)

## 2023-05-24 ENCOUNTER — Inpatient Hospital Stay: Payer: PPO

## 2023-05-24 DIAGNOSIS — Z79899 Other long term (current) drug therapy: Secondary | ICD-10-CM

## 2023-05-24 DIAGNOSIS — L89219 Pressure ulcer of right hip, unspecified stage: Secondary | ICD-10-CM | POA: Diagnosis not present

## 2023-05-24 DIAGNOSIS — E46 Unspecified protein-calorie malnutrition: Secondary | ICD-10-CM

## 2023-05-24 DIAGNOSIS — M25552 Pain in left hip: Secondary | ICD-10-CM | POA: Diagnosis not present

## 2023-05-24 DIAGNOSIS — T148XXA Other injury of unspecified body region, initial encounter: Secondary | ICD-10-CM | POA: Diagnosis not present

## 2023-05-24 DIAGNOSIS — Z7982 Long term (current) use of aspirin: Secondary | ICD-10-CM

## 2023-05-24 DIAGNOSIS — M24551 Contracture, right hip: Secondary | ICD-10-CM

## 2023-05-24 DIAGNOSIS — M24562 Contracture, left knee: Secondary | ICD-10-CM

## 2023-05-24 DIAGNOSIS — L97512 Non-pressure chronic ulcer of other part of right foot with fat layer exposed: Secondary | ICD-10-CM | POA: Diagnosis not present

## 2023-05-24 DIAGNOSIS — E440 Moderate protein-calorie malnutrition: Secondary | ICD-10-CM | POA: Diagnosis not present

## 2023-05-24 DIAGNOSIS — D638 Anemia in other chronic diseases classified elsewhere: Secondary | ICD-10-CM | POA: Diagnosis not present

## 2023-05-24 DIAGNOSIS — Z7984 Long term (current) use of oral hypoglycemic drugs: Secondary | ICD-10-CM

## 2023-05-24 DIAGNOSIS — M24561 Contracture, right knee: Secondary | ICD-10-CM

## 2023-05-24 LAB — BASIC METABOLIC PANEL
Anion gap: 6 (ref 5–15)
BUN: 16 mg/dL (ref 8–23)
CO2: 24 mmol/L (ref 22–32)
Calcium: 7.6 mg/dL — ABNORMAL LOW (ref 8.9–10.3)
Chloride: 109 mmol/L (ref 98–111)
Creatinine, Ser: 0.75 mg/dL (ref 0.44–1.00)
GFR, Estimated: >60 mL/min (ref >60)
Glucose, Bld: 205 mg/dL — ABNORMAL HIGH (ref 70–99)
Potassium: 3.9 mmol/L (ref 3.5–5.1)
Sodium: 139 mmol/L (ref 135–145)

## 2023-05-24 LAB — CBC
HCT: 22.1 % — ABNORMAL LOW (ref 36.0–46.0)
Hemoglobin: 6.5 g/dL — ABNORMAL LOW (ref 12.0–15.0)
MCH: 25.4 pg — ABNORMAL LOW (ref 26.0–34.0)
MCHC: 29.4 g/dL — ABNORMAL LOW (ref 30.0–36.0)
MCV: 86.3 fL (ref 80.0–100.0)
Platelets: 581 10*3/uL — ABNORMAL HIGH (ref 150–400)
RBC: 2.56 MIL/uL — ABNORMAL LOW (ref 3.87–5.11)
RDW: 18 % — ABNORMAL HIGH (ref 11.5–15.5)
WBC: 9.5 10*3/uL (ref 4.0–10.5)
nRBC: 0 % (ref 0.0–0.2)

## 2023-05-24 LAB — GLUCOSE, CAPILLARY
Glucose-Capillary: 107 mg/dL — ABNORMAL HIGH (ref 70–99)
Glucose-Capillary: 202 mg/dL — ABNORMAL HIGH (ref 70–99)
Glucose-Capillary: 113 mg/dL — ABNORMAL HIGH (ref 70–99)
Glucose-Capillary: 109 mg/dL — ABNORMAL HIGH (ref 70–99)

## 2023-05-24 LAB — PREPARE RBC (CROSSMATCH)

## 2023-05-24 LAB — TYPE AND SCREEN
ABO/RH(D): O NEG
Antibody Screen: NEGATIVE
Unit division: 0

## 2023-05-24 LAB — BPAM RBC
Blood Product Expiration Date: 202407152359
ISSUE DATE / TIME: 202407121558
Unit Type and Rh: 9500

## 2023-05-24 LAB — THYROID PANEL WITH TSH
Free Thyroxine Index: 1.6 (ref 1.2–4.9)
T3 Uptake Ratio: 42 % — ABNORMAL HIGH (ref 24–39)
T4, Total: 3.8 ug/dL — ABNORMAL LOW (ref 4.5–12.0)
TSH: 15.5 u[IU]/mL — ABNORMAL HIGH (ref 0.450–4.500)

## 2023-05-24 MED ORDER — SODIUM CHLORIDE 0.9% IV SOLUTION: Freq: Once | INTRAVENOUS | Status: AC

## 2023-05-24 MED ORDER — IOHEXOL 350 MG/ML SOLN
75.0000 mL | Freq: Once | INTRAVENOUS | Status: AC | PRN
  Administered 2023-05-24: 75 mL via INTRAVENOUS

## 2023-05-24 MED ORDER — GADOBUTROL 1 MMOL/ML IV SOLN
4.0000 mL | Freq: Once | INTRAVENOUS | Status: AC | PRN
  Administered 2023-05-24: 4 mL via INTRAVENOUS

## 2023-05-24 MED ORDER — LISINOPRIL 10 MG PO TABS
5.0000 mg | ORAL_TABLET | Freq: Every day | ORAL | Status: DC
  Administered 2023-05-24 – 2023-05-25 (×2): 5 mg via ORAL
  Filled 2023-05-24 (×2): qty 1

## 2023-05-24 MED ORDER — ATENOLOL 50 MG PO TABS
50.0000 mg | ORAL_TABLET | Freq: Every day | ORAL | Status: DC
  Administered 2023-05-24 – 2023-05-31 (×8): 50 mg via ORAL
  Filled 2023-05-24 (×8): qty 1

## 2023-05-24 NOTE — Progress Notes (Signed)
Speciality mattress/bed has been ordered for pt.

## 2023-05-24 NOTE — Consult Note (Signed)
Hospital Consult    Reason for Consult:  Bilateral Lower extremity Wounds/ulcerations Requesting Physician:  Dr Marcelino Duster MD MRN #:  [id number]  History of Present Illness: This is a 82 y.o. female  with medical history significant of several prior known ulceration of bony prominences on the body.  Patient reports these have been present since approximately January. Please see the pictures in the media file.  Patient follows up at wound care clinic.  Patient was seen for a routine evaluation today.  Patient's wounds were felt to be worsening and possibly infected.   On exam this morning the patient was resting comfortably in bed with her husband at her side. All of her wounds have been wrapped or dressed which I did not remove thanks to the excellent pictures in the media file. She endorses great pain upon palpation of her legs. She is contracted at the hips and the knees. Extension of these is extremely painful. Husband states she has not walked in over 2 years. Most of the time she sits in a wheelchair and rolls around. No other complaints today. Patients vitals all remain stable.   Past Medical History:  Diagnosis Date   Arthritis    Arthritis    Breast cancer (HCC) 1992   right breast with lumpectomy and rad tx   Cancer of right breast (HCC) 04/16/2013   right breast with mastectomy   Diabetes mellitus without complication (HCC)    Gout    High cholesterol    Hyperlipidemia    Hypertension    Personal history of radiation therapy     Past Surgical History:  Procedure Laterality Date   ABDOMINAL HYSTERECTOMY     ANKLE FRACTURE SURGERY Right 2004   BREAST LUMPECTOMY Right 1992   positive   IRRIGATION AND DEBRIDEMENT FOOT Right 11/02/2022   Procedure: IRRIGATION AND DEBRIDEMENT RIGHT FOOT AND BONE BIOPSY RIGHT FOOT;  Surgeon: Gwyneth Revels, DPM;  Location: [facility];  Service: Podiatry;  Laterality: Right;   IVC FILTER INSERTION N/A 11/06/2022   Procedure: IVC FILTER  INSERTION;  Surgeon: Annice Needy, MD;  Location: [facility];  Service: Cardiovascular;  Laterality: N/A;   MASTECTOMY Right 2014   PERIPHERAL VASCULAR THROMBECTOMY Left 11/06/2022   Procedure: PERIPHERAL VASCULAR THROMBECTOMY- EXTERNAL Iliac Vein/CFV;  Surgeon: Annice Needy, MD;  Location: [facility];  Service: Cardiovascular;  Laterality: Left;   SHOULDER SURGERY Right 2010    Allergies  Allergen Reactions   Other Anaphylaxis    Anesthisia has been a health issue for her in the past.    Sulfa Antibiotics Rash    Prior to Admission medications   Medication Sig Start Date End Date Taking? Authorizing Provider  acetaminophen (TYLENOL) 500 MG tablet Take 500-1,000 mg by mouth 4 (four) times daily as needed for mild pain or moderate pain.   Yes [provider]  allopurinol (ZYLOPRIM) 300 MG tablet Take 300 mg by mouth daily.   Yes [provider]  aspirin EC 81 MG tablet Take 1 tablet (81 mg total) by mouth daily. Swallow whole. 12/25/22  Yes Arnetha Courser, MD  atenolol (TENORMIN) 50 MG tablet Take 50 mg by mouth daily.   Yes [provider]  atorvastatin (LIPITOR) 40 MG tablet Take 40 mg by mouth daily.    Yes [provider]  diphenoxylate-atropine (LOMOTIL) 2.5-0.025 MG tablet Take 1-2 tablets by mouth 3 (three) times daily as needed. 08/20/22  Yes [provider]  furosemide (LASIX) 40  MG tablet Take 1 tablet (40 mg total) by mouth daily as needed for edema or fluid. 12/24/22  Yes Arnetha Courser, MD  gabapentin (NEURONTIN) 100 MG capsule TAKE 1 CAPSULE BY MOUTH ONCE DAILY FOR 7 DAYS THEN TAKE 1 CAPSULES BY MOUTH TWICE DAILY 02/21/23  Yes [provider]  gentamicin cream (GARAMYCIN) 0.1 % Apply 1 Application topically in the morning and at bedtime.   Yes [provider]  HYDROcodone-acetaminophen (NORCO) 7.5-325 MG tablet Take 1 tablet by mouth 2 (two) times daily as needed for moderate pain or severe pain.   Yes  [provider]  levothyroxine (SYNTHROID) 88 MCG tablet Take 88 mcg by mouth daily.   Yes [provider]  lisinopril (ZESTRIL) 5 MG tablet Take 5 mg by mouth daily. 09/24/22 09/24/23 Yes [provider]  metFORMIN (GLUCOPHAGE) 1000 MG tablet Take 1,000 mg by mouth in the morning and at bedtime.   Yes [provider]  Multiple Vitamin (MULTIVITAMIN WITH MINERALS) TABS tablet Take 1 tablet by mouth daily. 01/12/23  Yes Lurene Shadow, MD  polyethylene glycol (MIRALAX / GLYCOLAX) 17 g packet Take 17 g by mouth daily. 12/25/22  Yes Arnetha Courser, MD  sitaGLIPtin (JANUVIA) 50 MG tablet Take 50 mg by mouth daily.   Yes [provider]  Vaginal Moisturizer (REPLENS EXTERNAL COMFORT) GEL Apply 1 application  topically daily. 04/30/23  Yes Vaillancourt, Samantha, PA-C  vitamin B-12 (CYANOCOBALAMIN) 1000 MCG tablet Take 1,000 mcg by mouth daily.   Yes [provider]  sodium hypochlorite (DAKIN'S 1/4 STRENGTH) 0.125 % SOLN moisten gauze FOR wet TO DRY dressings. Patient not taking: Reported on 05/22/2023 10/22/22   [provider]    Social History   Socioeconomic History   Marital status: Married    Spouse name: Not on file   Number of children: 2   Years of education: Not on file   Highest education level: Not on file  Occupational History   Not on file  Tobacco Use   Smoking status: Never    Passive exposure: Never   Smokeless tobacco: Never  Vaping Use   Vaping status: Never Used  Substance and Sexual Activity   Alcohol use: No    Alcohol/week: 0.0 standard drinks of alcohol   Drug use: No   Sexual activity: Not Currently  Other Topics Concern   Not on file  Social History Narrative   Not on file   Social Determinants of Health   Financial Resource Strain: Not on file  Food Insecurity: No Food Insecurity (05/22/2023)   Hunger Vital Sign    Worried About Running Out of Food in the Last Year: Never true    Ran Out of Food  in the Last Year: Never true  Transportation Needs: No Transportation Needs (05/22/2023)   PRAPARE - Administrator, Civil Service (Medical): No    Lack of Transportation (Non-Medical): No  Physical Activity: Not on file  Stress: Not on file  Social Connections: Not on file  Intimate Partner Violence: Not At Risk (05/22/2023)   Humiliation, Afraid, Rape, and Kick questionnaire    Fear of Current or Ex-Partner: No    Emotionally Abused: No    Physically Abused: No    Sexually Abused: No     Family History  Problem Relation Age of Onset   Diabetes Father    Breast cancer Neg Hx     ROS: Otherwise negative unless mentioned in HPI  Physical Examination  Vitals:  05/23/23 1930 05/24/23 0529  BP: 115/66 (!) 130/57  Pulse: 100 92  Resp: 20 20  Temp: 98.5 F (36.9 C) 98.6 F (37 C)  SpO2: 99% 98%   Body mass index is 19.79 kg/m.  General:  WDWN in NAD Gait: Not observed HENT: WNL, normocephalic Pulmonary: normal non-labored breathing, without Rales, rhonchi,  wheezing Cardiac: regular, without  Murmurs, rubs or gallops; without carotid bruits Abdomen: Positive bowel sounds, soft, NT/ND, no masses Skin: without rashes Vascular Exam/Pulses: Palpable upper extremity pulses but unable to palpate bilateral lower extremity pulses. Bilateral lower extremities are warm to touch.  Extremities: with ischemic changes, with Gangrene , with cellulitis; with open wounds;  Musculoskeletal: no muscle wasting or atrophy  Neurologic: A&O X 3;  No focal weakness or paresthesias are detected; speech is fluent/normal Psychiatric:  The pt has Normal affect. Lymph:  Unremarkable  CBC    Component Value Date/Time   WBC 9.5 05/24/2023 0619   RBC 2.56 (L) 05/24/2023 0619   HGB 6.5 (L) 05/24/2023 0619   HGB 12.0 10/01/2014 1119   HCT 22.1 (L) 05/24/2023 0619   HCT 36.5 10/01/2014 1119   PLT 581 (H) 05/24/2023 0619   PLT 233 10/01/2014 1119   MCV 86.3 05/24/2023 0619   MCV 90  10/01/2014 1119   MCH 25.4 (L) 05/24/2023 0619   MCHC 29.4 (L) 05/24/2023 0619   RDW 18.0 (H) 05/24/2023 0619   RDW 14.6 (H) 10/01/2014 1119   LYMPHSABS 2.0 05/22/2023 1627   LYMPHSABS 2.7 10/01/2014 1119   MONOABS 0.5 05/22/2023 1627   MONOABS 0.5 10/01/2014 1119   EOSABS 0.2 05/22/2023 1627   EOSABS 0.1 10/01/2014 1119   BASOSABS 0.1 05/22/2023 1627   BASOSABS 0.1 10/01/2014 1119    BMET    Component Value Date/Time   NA 139 05/24/2023 0619   NA 142 10/01/2014 1119   K 3.9 05/24/2023 0619   K 3.9 10/01/2014 1119   CL 109 05/24/2023 0619   CL 104 10/01/2014 1119   CO2 24 05/24/2023 0619   CO2 28 10/01/2014 1119   GLUCOSE 205 (H) 05/24/2023 0619   GLUCOSE 101 (H) 10/01/2014 1119   BUN 16 05/24/2023 0619   BUN 41 (H) 09/08/2018 1543   BUN 18 10/01/2014 1119   CREATININE 0.75 05/24/2023 0619   CREATININE 1.20 10/01/2014 1119   CALCIUM 7.6 (L) 05/24/2023 0619   CALCIUM 8.9 10/01/2014 1119   GFRNONAA >60 05/24/2023 0619   GFRNONAA 47 (L) 10/01/2014 1119   GFRNONAA 57 (L) 03/26/2014 0949   GFRAA 54 (L) 12/22/2018 1036   GFRAA 57 (L) 10/01/2014 1119   GFRAA >60 03/26/2014 0949    COAGS: Lab Results  Component Value Date   INR 1.2 05/23/2023   INR 1.5 (H) 01/06/2023   INR 1.5 (H) 12/19/2022     Non-Invasive Vascular Imaging:     EXAM:12/19/22 CT ABDOMEN AND PELVIS WITH CONTRAST   TECHNIQUE: Multidetector CT imaging of the abdomen and pelvis was performed using the standard protocol following bolus administration of intravenous contrast.   RADIATION DOSE REDUCTION: This exam was performed according to the departmental dose-optimization program which includes automated exposure control, adjustment of the mA and/or kV according to patient size and/or use of iterative reconstruction technique.   CONTRAST:  75mL OMNIPAQUE IOHEXOL 300 MG/ML  SOLN   COMPARISON:  CT of the abdomen and pelvis 11/01/2022.   FINDINGS: Lower chest: Trace bilateral pleural effusions  lying dependently. Atherosclerotic calcifications are noted in the descending thoracic  aorta. Calcifications of the mitral annulus.   Hepatobiliary: No definite suspicious cystic or solid hepatic lesions are noted. No intra or extrahepatic biliary ductal dilatation. Status post cholecystectomy.   Pancreas: No pancreatic mass. No pancreatic ductal dilatation. No pancreatic or peripancreatic fluid collections or inflammatory changes.   Spleen: Unremarkable.   Adrenals/Urinary Tract: Extensive cortical thinning noted in the anterior aspect of the left kidney, presumably scarring from prior infection or infarction. Right kidney and bilateral adrenal glands are otherwise normal in appearance. No hydroureteronephrosis. Urinary bladder is moderately distended, and base of the urinary bladder extends well below the level of the pubococcygeal line at rest, indicative of a cystocele from pelvic floor laxity.   Stomach/Bowel: The appearance of the stomach is normal. No pathologic dilatation of small bowel or colon. Numerous colonic diverticuli are noted, without definite focal surrounding inflammatory changes to indicate an acute diverticulitis at this time. Normal appendix.   Vascular/Lymphatic: Aortic atherosclerosis, without evidence of aneurysm or dissection in the abdominal or pelvic vasculature. IVC filter noted, with tip terminating shortly below the level of the renal veins. No lymphadenopathy noted in the abdomen or pelvis.   Reproductive: Status post hysterectomy. Ovaries are not confidently identified may be surgically absent or atrophic.   Other: No significant volume of ascites.  No pneumoperitoneum.   Musculoskeletal: There are no aggressive appearing lytic or blastic lesions noted in the visualized portions of the skeleton.   IMPRESSION: 1. No definite acute findings noted in the abdomen or pelvis to account for the patient's symptoms. 2. Trace bilateral pleural  effusions lying dependently. 3. Colonic diverticulosis without evidence of acute diverticulitis at this time. 4. Aortic atherosclerosis. 5. Pelvic floor laxity with cystocele. 6. Additional incidental findings, as above.  Statin:  Yes.   Beta Blocker:  Yes.   Aspirin:  Yes.   ACEI:  No. ARB:  No. CCB use:  No Other antiplatelets/anticoagulants:  No.    ASSESSMENT/PLAN: This is a 82 y.o. female who is frail and malnourished and now presents to the emergency department after routine evaluation yesterday at the wound care clinic.  She is noted to have multiple areas of skin breakdown with a deep penetrating wound to her right hip.  Concern is for infection and limited blood flow to bilateral lower extremities.  On examination she is contracted at the hips and the right knee and according to her husband she has not walked in over 2 years.  Patient's ambulation consists of using her feet to roll a wheelchair that she sits in.  PLAN: Due to the patient's frailty and bilateral hip contractions with bilateral knee contractions it would be very difficult if at all possible to do a lower extremity angiogram to assess blood flow to her lower extremities.  Vascular surgery recommends if the family wishes to proceed with any care a CTA of the abdomen and pelvis to better assess blood flow through the lower aorta, iliac arteries and femoral arteries.  Plan moving forward will be discussed after CTA has been obtained if family wishes to proceed.   -I discussed the plan in detail with Dr. Festus Barren MD and he agrees with the plan   Marcie Bal [facility] 05/24/2023 7:27 AM

## 2023-05-24 NOTE — Progress Notes (Signed)
  Progress Note   Patient: [name]:[id number] DOB: [birth year] DOA: 05/22/2023     2 DOS: the patient was seen and examined on 05/24/2023   Brief hospital course: [name] is a 82 y.o. 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  Patient reports these have been present since approximately January.  Patient follows up at wound care clinic.  Patient was seen for a routine evaluation today.  Patient's wounds were felt to be worsening and possibly infected. Podiatry team suggested MRI foot, vascular eval. Vascular  suggested CTA abd/ pelvis, not a candidate for angiogram. Her Hb low got consent for a unit of blood.  Assessment and Plan: * Cellulitis- multiple open wounds on extremities. Osteomyelitis greater trochanter, left foot Stage 3-4 ulcers over heels. Sacrum, left hip, lower extremities. Patient is sent in from her wound clinic for her chronic wounds due to concern of infection.  She does have osteomyelitis as evidenced by MRI. She is poor surgical candidate. I discussed with husband. He wants to take her home with conservative management. Continue with vancomycin and Zosyn. Vascular team advised CTA abdomen/ pelvis to assess blood supply to extremities.   Anemia of chronic disease This is a chronic diagnosis.  Hb at 6.5 will transfuse 1 unit of blood. Iron level low. continue IV iron therapy. Continue to monitor H/H. Will transfuse for Hb greater than 7.  Malnutrition of Severe degree Has a diagnosis previously documented in the chart.  She has skin ulcers, BMI 19. Nutrition evaluation appreciated.  Hypertension Blood pressure lower side, caution with antihypertensives.  Diabetes mellitus, type 2 (HCC) Continue accucheks, insulin sliding scale        Subjective: Patient seen and examined today morning.  She denies any complaints, wishes to go home. States that her Hb always low. Husband at  bedside, poor prognosis explained. He wishes to get CTA abdomen/ pelvis. Eating poor, did not get out of bed.  Physical Exam: Vitals:   05/24/23 1615 05/24/23 1705 05/24/23 1823 05/24/23 1925  BP: 131/69 (!) 113/95 118/62 (!) 115/91  Pulse: 85 85 87 86  Resp: 17 16 18 16   Temp: 98.2 F (36.8 C)  98.6 F (37 C) 98.6 F (37 C)  TempSrc: Oral  Oral Oral  SpO2: 100%  100% 100%  Weight:      Height:       General - Elderly cachectic Caucasian female, no apparent distress HEENT - PERRLA, EOMI, atraumatic head, non tender sinuses. Lung - Clear, rales, rhonchi, wheezes. Heart - S1, S2 heard, no murmurs, rubs, trace pedal edema Neuro -alert, awake, non focal exam. Skin - Warm and dry.  Multiple skin ulcers as noted above Data Reviewed:  CBC, BMP, MRI, blood sugars  Family Communication: Updated husband regarding current care, advised goals of care discussion. He understands.  Disposition: Status is: Inpatient Remains inpatient appropriate because: multiple skin ulcers need podiatry, vascular evaluation.  Planned Discharge Destination: Skilled nursing facility vs home health    Time spent: 44 minutes  Author: Marcelino Duster, MD 05/24/2023 7:52 PM  For on call review [email or web address].

## 2023-05-24 NOTE — Care Management Important Message (Signed)
Important Message  Patient Details  Name: [name] MRN: [id number] Date of Birth: [birth year]   Medicare Important Message Given:  N/A - LOS <3 / Initial given by admissions     [name] 05/24/2023, 8:25 AM

## 2023-05-24 NOTE — Consult Note (Signed)
Pharmacy Antibiotic Note  [name] is a 82 y.o. female w/ PMH of gout, HLD, DM, peripheral neuropathy, hypothyroidism, ulceration of bony prominences, HTN admitted on 05/22/2023 with worsening of chronic wounds. Imaging ordered. Pharmacy has been consulted for Vancomycin and Zosyn dosing.  Plan:  1) continue Zosyn 3.375g IV q8h (4 hour infusion).  2) continue vancomycin 1000 mg IV every 36 hours Goal AUC 400-550 Estimated AUC 5461/Cmin: 11.6 Scr 0.8 (rounded up), IBW, Vd 0.72   Height: 4\' 11"  (149.9 cm) Weight: 44.5 kg (98 lb) IBW/kg (Calculated) : 43.2  Temp (24hrs), Avg:98.1 F (36.7 C), Min:97.2 F (36.2 C), Max:98.6 F (37 C)  Recent Labs  Lab 05/22/23 1627 05/22/23 1928 05/22/23 2342 05/23/23 0538  WBC 13.9*  --  13.9* 12.4*  CREATININE 0.67  --  0.69 0.59  LATICACIDVEN 1.9 2.5*  --   --     Estimated Creatinine Clearance: 38.3 mL/min (by C-G formula based on SCr of 0.59 mg/dL).    Allergies  Allergen Reactions   Other Anaphylaxis    Anesthisia has been a health issue for her in the past.    Sulfa Antibiotics Rash    Antimicrobials this admission: 7/10 vancomycin >>  7/10 Zosyn >>   Microbiology results: 7/10 BCx: NGTD 7/10 Wound superficial Cx: pending   Thank you for allowing pharmacy to be a part of this patient's care.  Lowella Bandy, PharmD, BCPS Clinical Pharmacist   05/24/2023 6:59 AM

## 2023-05-24 NOTE — Progress Notes (Signed)
Physical Therapy Treatment Patient Details Name: [name] MRN: [id number] DOB: 1941-05-02 Today's Date: 05/24/2023   History of Present Illness [name] is a 82 y.o. 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  Patient reports these have been present since approximately January.  Patient follows up at wound care clinic.  Patient was seen for a routine evaluation today.  Patient's wounds were felt to be worsening and possibly infected.  Therefore patient is sent to the ER today.    PT Comments  Patient received in bed, she is aggravated by multiple things regarding hospitalization and overall care.  Patient is agreeable to PT session. She required mod A for bed mobility and will need max assist for pivot to a chair. Patient is able to balance and sit edge of bed for several minutes. She will benefit from continued mobility (position changing, getting up to recliner) for pressure relief. Patient is at her baseline level of mobility therefore no skilled needs are present, however nursing and mobility specialist can continue providing the mobility she needs. PT to sign off at this time as patient has no skilled needs and is at baseline level of function. Patient will benefit from home or outpatient follow up for wheelchair evaluation for pressure relief /cushion as well as home mattress evaluation.       Assistance Recommended at Discharge Intermittent Supervision/Assistance  If plan is discharge home, recommend the following:  Can travel by private vehicle    A lot of help with walking and/or transfers;A lot of help with bathing/dressing/bathroom;Assist for transportation   Yes  Equipment Recommendations  Other (comment) (wheelchair cushion, air mattress)    Recommendations for Other Services       Precautions / Restrictions Precautions Precautions: Fall Restrictions Weight Bearing Restrictions: No Other  Position/Activity Restrictions: many significant pressure wounds     Mobility  Bed Mobility Overal bed mobility: Needs Assistance Bed Mobility: Supine to Sit, Sit to Supine     Supine to sit: Mod assist Sit to supine: Mod assist   General bed mobility comments: Patient is able to sit edge of bed without support, but needs assistance with bed mobility Knee and hip contractures B LE.    Transfers Overall transfer level: Needs assistance                 General transfer comment: Patient would need mod/max A for transfer to whelchair which is her baseline    Ambulation/Gait               General Gait Details: non-ambulatory   Stairs             Wheelchair Mobility     Tilt Bed    Modified Rankin (Stroke Patients Only)       Balance Overall balance assessment: Independent                                          Cognition Arousal/Alertness: Awake/alert Behavior During Therapy: WFL for tasks assessed/performed Overall Cognitive Status: Within Functional Limits for tasks assessed                                          Exercises      General Comments  Pertinent Vitals/Pain Pain Assessment Pain Assessment: Faces Faces Pain Scale: Hurts little more Pain Location: wounds Pain Descriptors / Indicators: Discomfort, Grimacing, Guarding, Sore Pain Intervention(s): Monitored during session, Repositioned    Home Living                          Prior Function            PT Goals (current goals can now be found in the care plan section) Acute Rehab PT Goals Patient Stated Goal: to decrease pain PT Goal Formulation: All assessment and education complete, DC therapy Progress towards PT goals: Goals met/education completed, patient discharged from PT    Frequency           PT Plan Discharge plan needs to be updated    Co-evaluation              AM-PAC PT "6 Clicks"  Mobility   Outcome Measure  Help needed turning from your back to your side while in a flat bed without using bedrails?: A Little Help needed moving from lying on your back to sitting on the side of a flat bed without using bedrails?: A Lot Help needed moving to and from a bed to a chair (including a wheelchair)?: A Lot Help needed standing up from a chair using your arms (e.g., wheelchair or bedside chair)?: A Lot Help needed to walk in hospital room?: Total Help needed climbing 3-5 steps with a railing? : Total 6 Click Score: 11    End of Session   Activity Tolerance: Patient tolerated treatment well Patient left: in bed;with call bell/phone within reach;with nursing/sitter in room Nurse Communication: Mobility status PT Visit Diagnosis: Other abnormalities of gait and mobility (R26.89) Pain - part of body: Ankle and joints of foot;Leg;Hip     Time: 1530-1559 PT Time Calculation (min) (ACUTE ONLY): 29 min  Charges:    $Therapeutic Activity: 23-37 mins PT General Charges $$ ACUTE PT VISIT: 1 Visit                     Quirino Kakos, PT, GCS 05/24/23,4:16 PM

## 2023-05-25 DIAGNOSIS — L03119 Cellulitis of unspecified part of limb: Secondary | ICD-10-CM

## 2023-05-25 DIAGNOSIS — E119 Type 2 diabetes mellitus without complications: Secondary | ICD-10-CM | POA: Diagnosis not present

## 2023-05-25 DIAGNOSIS — L98421 Non-pressure chronic ulcer of back limited to breakdown of skin: Secondary | ICD-10-CM | POA: Diagnosis not present

## 2023-05-25 DIAGNOSIS — L97512 Non-pressure chronic ulcer of other part of right foot with fat layer exposed: Secondary | ICD-10-CM | POA: Diagnosis not present

## 2023-05-25 DIAGNOSIS — D638 Anemia in other chronic diseases classified elsewhere: Secondary | ICD-10-CM | POA: Diagnosis not present

## 2023-05-25 DIAGNOSIS — T148XXA Other injury of unspecified body region, initial encounter: Secondary | ICD-10-CM | POA: Diagnosis not present

## 2023-05-25 DIAGNOSIS — Z515 Encounter for palliative care: Secondary | ICD-10-CM

## 2023-05-25 DIAGNOSIS — E440 Moderate protein-calorie malnutrition: Secondary | ICD-10-CM | POA: Diagnosis not present

## 2023-05-25 DIAGNOSIS — Z7189 Other specified counseling: Secondary | ICD-10-CM

## 2023-05-25 LAB — TYPE AND SCREEN

## 2023-05-25 LAB — CBC
HCT: 26.7 % — ABNORMAL LOW (ref 36.0–46.0)
Hemoglobin: 8.2 g/dL — ABNORMAL LOW (ref 12.0–15.0)
MCH: 25.9 pg — ABNORMAL LOW (ref 26.0–34.0)
MCHC: 30.7 g/dL (ref 30.0–36.0)
MCV: 84.5 fL (ref 80.0–100.0)
Platelets: 548 10*3/uL — ABNORMAL HIGH (ref 150–400)
RBC: 3.16 MIL/uL — ABNORMAL LOW (ref 3.87–5.11)
RDW: 17.3 % — ABNORMAL HIGH (ref 11.5–15.5)
WBC: 11.3 10*3/uL — ABNORMAL HIGH (ref 4.0–10.5)
nRBC: 0.3 % — ABNORMAL HIGH (ref 0.0–0.2)

## 2023-05-25 LAB — GLUCOSE, CAPILLARY
Glucose-Capillary: 105 mg/dL — ABNORMAL HIGH (ref 70–99)
Glucose-Capillary: 109 mg/dL — ABNORMAL HIGH (ref 70–99)
Glucose-Capillary: 117 mg/dL — ABNORMAL HIGH (ref 70–99)
Glucose-Capillary: 95 mg/dL (ref 70–99)

## 2023-05-25 LAB — VITAMIN E
Vitamin E (Alpha Tocopherol): 12.3 mg/L (ref 9.0–29.0)
Vitamin E(Gamma Tocopherol): 2.2 mg/L (ref 0.5–4.9)

## 2023-05-25 LAB — VITAMIN A: Vitamin A (Retinoic Acid): 38.1 ug/dL (ref 22.0–69.5)

## 2023-05-25 LAB — BPAM RBC

## 2023-05-25 MED ORDER — MORPHINE SULFATE (PF) 2 MG/ML IV SOLN
2.0000 mg | INTRAVENOUS | Status: DC | PRN
  Administered 2023-05-26 – 2023-05-30 (×6): 2 mg via INTRAVENOUS
  Filled 2023-05-25 (×7): qty 1

## 2023-05-25 NOTE — Progress Notes (Signed)
  Progress Note   Patient: [name] ZOX:[id number] DOB: 1941/10/24 DOA: 05/22/2023     3 DOS: the patient was seen and examined on 05/25/2023   Brief hospital course: [name] is a 82 y.o. 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  Patient reports these have been present since approximately January.  Patient follows up at wound care clinic.  Patient was seen for a routine evaluation today.  Patient's wounds were felt to be worsening and possibly infected started antibiotics, admitted to hospitalist service.  Podiatry team suggested MRI foot, vascular eval. Vascular  suggested CTA abd/ pelvis, not a candidate for angiogram. Her Hb low got consent for a unit of blood.  Assessment and Plan: * Cellulitis- multiple open wounds on extremities. Osteomyelitis greater trochanter, left foot Stage 3-4 ulcers over heels, sacrum, left hip, lower extremities. Patient is sent in from her wound clinic for her chronic wounds due to concern of infection. She does have osteomyelitis as evidenced by MRI. She is poor surgical candidate. I discussed with husband and son, palliative team had goals of care discussion. She wants to be Full code and would like to go home with conservative management. Continue with vancomycin and Zosyn. Podiatry team to follow. Vascular team on board did not think she can undergo angiogram given her chronic extremity flexion.   Anemia of chronic disease This is a chronic diagnosis.  Hb at 6.5 trended upto 8.2 s/p a unit of blood. Iron level low. continue IV iron therapy. Continue to monitor H/H. Will transfuse for Hb greater than 7.  Malnutrition of Severe degree Has a diagnosis previously documented in the chart.  She has skin ulcers, BMI 19. Nutrition evaluation appreciated. Palliative to continue goals of care discussion.  Hypertension Blood pressure lower side, caution with  antihypertensives.  Diabetes mellitus, type 2 (HCC) Continue accucheks, insulin sliding scale  Overall prognosis poor. She will need long term antibiotics. Conservative management.    Subjective: Patient seen and examined today morning.  She is lying in bed, son at bedside. Denies any complaints. Did discuss about her diagnosis and care.   Physical Exam: Vitals:   05/25/23 0759 05/25/23 1026 05/25/23 1621 05/25/23 1632  BP: (!) 108/58 105/62 (!) 98/52 (!) 97/56  Pulse: 78 73 72 72  Resp: 16 16 16    Temp: (!) 97.2 F (36.2 C) 98.2 F (36.8 C) (!) 97.2 F (36.2 C)   TempSrc:  Oral    SpO2: 100% 99% 97% 96%  Weight:      Height:       General - Elderly cachectic Caucasian female, no apparent distress HEENT - PERRLA, EOMI, atraumatic head, non tender sinuses. Lung - Clear, rales, rhonchi, wheezes. Heart - S1, S2 heard, no murmurs, rubs, trace pedal edema Neuro -alert, awake, non focal exam. Skin - Warm and dry.  Multiple skin ulcers as noted above Data Reviewed:  CBC, BMP, blood sugars  Family Communication: Updated husband, son regarding current care, advised goals of care discussion.  Disposition: Status is: Inpatient Remains inpatient appropriate because: multiple skin ulcers need IV antibiotics, podiatry, vascular follow up  Planned Discharge Destination: Skilled nursing facility vs home health    Time spent: 44 minutes  Author: Marcelino Duster, MD 05/25/2023 4:34 PM  For on call review [email or web address].

## 2023-05-25 NOTE — Consult Note (Signed)
Consultation Note Date: 05/25/2023   Patient Name: [name]  DOB: Mar 14, 1941  MRN: [id number]  Age / Sex: 82 y.o., female  PCP: [name] Reichmann, MD Referring Physician: Marcelino Duster, MD  Reason for Consultation: Establishing goals of care   HPI/Brief Hospital Course: 82 y.o. female  with past medical history of breast cancer s/p mastectomy and radiation therapy, diabetes, HLD, HTN, hypothyroidism, anemia, PAD, neuropathy, malnutrition, DVT and chronic wounds admitted from home after recommendations from outpatient wound clinic on 05/22/2023 with worsening skin ulcers, primarily right hip and bilateral lower extremities. Concern for infection.  Noted 3 IP admits in last 6 months  Most recent admit 01/05/23-01/15/23 for sepsis and osteomyelitis of chronic wounds  Seems outpatient palliative services was set up at discharge in March, from chart review it seems [name] and husband declined visit from PMT provider at home  Continues to be followed by outpatient wound clinic  MRI Pelvis 7/10 IMPRESSION: 1. Large skin wound measuring up to 4 cm at the level of the right greater trochanter with exposed bone and abnormal marrow signal of the great toe trochanter consistent with osteomyelitis. 2. Midline sacral ulcer with subjacent enhancing granulation tissues and collection measuring 3.4 x 3.5 x 3.0 cm  MRI right foot 7/12 IMPRESSION: 1. Acute osteomyelitis of the posterior calcaneal body. 2. Bone marrow edema within the medial malleolus, evaluation at this site is limited by adjacent susceptibility artifact and regionally poor fat saturation. Findings are suspicious for acute osteomyelitis. 3. Soft tissue ulcerations at the posterior heel and overlying the medial malleolus. 4. Achilles tendinosis.   Being treated with antibiotic therapy, vascular surgery has been consulted, concern for limited blood flow-due to frailty and  contractures angiogram would be difficult, proceeded with CTA, awaiting further recommendations  Palliative medicine was consulted for assisting with goals of care conversations.  Subjective:  Extensive chart review has been completed prior to meeting patient including labs, vital signs, imaging, progress notes, orders, and available advanced directive documents from current and previous encounters.  Visited with [name] at her bedside. Awake and alert, oriented and able to engage in goals of care conversations. Husband-[name] at bedside, son-[name] later joined conversation.  Introduced myself as a Publishing rights manager as a member of the palliative care team. Explained palliative medicine is specialized medical care for people living with serious illness. It focuses on providing relief from the symptoms and stress of a serious illness. The goal is to improve quality of life for both the patient and the family.   [name] and [name] unable to recall conversations or visits from PMT during prior hospitalizations. Seems there was confusion surrounding difference between Palliative Care versus Hospice Care as [name] shared his understanding under Palliative Care, [name] would no longer be allowed to come to hospital for treatment. Spent quite a bit of time explaining difference, continue to question their full understanding.  [name] and [name] share a brief life review. They have been married for over 60 years and they have 2 sons. [name] requires assistance with ADL's, requires assistance with transfers and hygiene. She spends the majority of her day in a hospital bed or wheelchair and watching gospel TV brings her joy throughout the day. 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  [name] and [name] both express their grievances related to overall poor health care she has received throughout the last several months. From their  perspective, wounds were created during a  previous hospitalization, they do not feel they are being treated and managed appropriately. Throughout their conversations it seems there is some mix up between distinguishing each hospitalization versus outpatient visits. They share [name] is only able to visit outpatient wound clinic once per month and other times wound care falls on family responsibility for which they find difficult.   [name] frustrated with ongoing and sudden decline. She is able to acknowledge poor wound healing and discussions were had on nutrition, underlying conditions such as diabetes, vascular insufficiency and functional status impacting this.  [name] clear in hoping to have [name] return home and feels if he is provided adequate resources he will be able to continue to care for her at home and heal her wounds.  Attempted to elicit goals of care. We discussed Code Status-Full Code versus Do Not Resuscitate. From chart review, it seems she has been DNR in the past but they cannot recall this and share they have not been provided a golden form in the past, no documents available for review. Encouraged patient/family to consider DNR/DNI status understanding evidenced based poor outcomes in similar hospitalized patients, as the cause of the arrest is likely associated with chronic/terminal disease rather than a reversible acute cardio-pulmonary event.  We discussed the risk of poor outcomes due to frailty and debility. At this time, [name] and [name] wish for her to remain Full Code. Strongly encouraged open and ongoing family conversations regarding Code Status and overall goals of care.  I discussed importance of continued conversations with family/support persons and all members of their medical team regarding overall plan of care and treatment options ensuring decisions are in alignment with patients goals of care.  All questions/concerns addressed. Emotional  support provided to patient/family/support persons. PMT will continue to follow and support patient as needed.  Objective: Primary Diagnoses: Present on Admission:  Cellulitis  Anemia of chronic disease  Protein-calorie malnutrition, severe (HCC)  Hypertension   Physical Exam Constitutional:      General: She is not in acute distress.    Appearance: She is ill-appearing.  Pulmonary:     Effort: Pulmonary effort is normal. No respiratory distress.  Skin:    General: Skin is warm and dry.     Findings: Bruising present.  Neurological:     Mental Status: She is alert.     Motor: Weakness present.     Vital Signs: BP 105/62   Pulse 73   Temp 98.2 F (36.8 C) (Oral)   Resp 16   Ht 4\' 11"  (1.499 m)   Wt 44.5 kg   SpO2 99%   BMI 19.79 kg/m  Pain Scale: 0-10 POSS *See Group Information*: 1-Acceptable,Awake and alert Pain Score: 7   IO: Intake/output summary:  Intake/Output Summary (Last 24 hours) at 05/25/2023 1107 Last data filed at 05/25/2023 1102 Gross per 24 hour  Intake 1782.93 ml  Output 2930 ml  Net -1147.07 ml    LBM: Last BM Date : 05/23/23 Baseline Weight: Weight: 52.6 kg Most recent weight: Weight: 44.5 kg       Palliative Assessment/Data: 50%   Assessment and Plan  SUMMARY OF RECOMMENDATIONS   Full Code-Full Scope Ongoing GOC needed Consider increasing frequency of Norco if pain management becomes an issue PMT to continue to follow for ongoing needs and support   Thank you for this consult and allowing Palliative Medicine to participate in the care of Torsha L, Stanislaw. Palliative medicine will continue to follow and assist as needed.  Time Total: 75 minutes  Time spent includes: Detailed review of medical records (labs, imaging, vital signs), medically appropriate exam (mental status, respiratory, cardiac, skin), discussed with treatment team, counseling and educating patient, family and staff, documenting clinical information, medication  management and coordination of care.   Signed by: Leeanne Deed, DNP, AGNP-C Palliative Medicine    Please contact Palliative Medicine Team phone at [phone] for questions and concerns.  For individual provider: See Loretha Stapler

## 2023-05-26 DIAGNOSIS — D638 Anemia in other chronic diseases classified elsewhere: Secondary | ICD-10-CM | POA: Diagnosis not present

## 2023-05-26 DIAGNOSIS — L03119 Cellulitis of unspecified part of limb: Secondary | ICD-10-CM | POA: Diagnosis not present

## 2023-05-26 DIAGNOSIS — E440 Moderate protein-calorie malnutrition: Secondary | ICD-10-CM | POA: Diagnosis not present

## 2023-05-26 DIAGNOSIS — L97512 Non-pressure chronic ulcer of other part of right foot with fat layer exposed: Secondary | ICD-10-CM | POA: Diagnosis not present

## 2023-05-26 DIAGNOSIS — T148XXA Other injury of unspecified body region, initial encounter: Secondary | ICD-10-CM | POA: Diagnosis not present

## 2023-05-26 DIAGNOSIS — L98421 Non-pressure chronic ulcer of back limited to breakdown of skin: Secondary | ICD-10-CM | POA: Diagnosis not present

## 2023-05-26 DIAGNOSIS — E43 Unspecified severe protein-calorie malnutrition: Secondary | ICD-10-CM | POA: Diagnosis not present

## 2023-05-26 LAB — GLUCOSE, CAPILLARY
Glucose-Capillary: 96 mg/dL (ref 70–99)
Glucose-Capillary: 131 mg/dL — ABNORMAL HIGH (ref 70–99)
Glucose-Capillary: 91 mg/dL (ref 70–99)
Glucose-Capillary: 138 mg/dL — ABNORMAL HIGH (ref 70–99)

## 2023-05-26 LAB — CREATININE, SERUM
Creatinine, Ser: 0.75 mg/dL (ref 0.44–1.00)
GFR, Estimated: >60 mL/min (ref >60)

## 2023-05-26 MED ORDER — HYDROCODONE-ACETAMINOPHEN 7.5-325 MG PO TABS
1.0000 | ORAL_TABLET | ORAL | Status: DC | PRN
  Administered 2023-05-26 – 2023-05-31 (×16): 1 via ORAL
  Filled 2023-05-26 (×16): qty 1

## 2023-05-26 MED ORDER — SODIUM CHLORIDE 0.9 % IV SOLN: INTRAVENOUS | Status: DC | PRN

## 2023-05-26 NOTE — Progress Notes (Signed)
Daily Progress Note   Patient Name: [name]       Date: 05/26/2023 DOB: 08/09/1941  Age: 82 y.o. MRN#: [id number] Attending Physician: Marcelino Duster, MD Primary Care Physician: Barbette Reichmann, MD Admit Date: 05/22/2023  Reason for Consultation/Follow-up: Establishing goals of care  HPI/Brief Hospital Review: 82 y.o. female  with past medical history of breast cancer s/p mastectomy and radiation therapy, diabetes, HLD, HTN, hypothyroidism, anemia, PAD, neuropathy, malnutrition, DVT and chronic wounds admitted from home after recommendations from outpatient wound clinic on 05/22/2023 with worsening skin ulcers, primarily right hip and bilateral lower extremities. Concern for infection.   Noted 3 IP admits in last 6 months   Most recent admit 01/05/23-01/15/23 for sepsis and osteomyelitis of chronic wounds   Seems outpatient palliative services was set up at discharge in March, from chart review it seems Ms. Fok and husband declined visit from PMT provider at home   Continues to be followed by outpatient wound clinic   MRI Pelvis 7/10 IMPRESSION: 1. Large skin wound measuring up to 4 cm at the level of the right greater trochanter with exposed bone and abnormal marrow signal of the great toe trochanter consistent with osteomyelitis. 2. Midline sacral ulcer with subjacent enhancing granulation tissues and collection measuring 3.4 x 3.5 x 3.0 cm   MRI right foot 7/12 IMPRESSION: 1. Acute osteomyelitis of the posterior calcaneal body. 2. Bone marrow edema within the medial malleolus, evaluation at this site is limited by adjacent susceptibility artifact and regionally poor fat saturation. Findings are suspicious for acute osteomyelitis. 3. Soft tissue ulcerations  at the posterior heel and overlying the medial malleolus. 4. Achilles tendinosis.   Being treated with antibiotic therapy, vascular surgery has been consulted, concern for limited blood flow-due to frailty and contractures angiogram would be difficult, proceeded with CTA, awaiting further recommendations   Palliative medicine was consulted for assisting with goals of care conversations.  Subjective: Extensive chart review has been completed prior to meeting patient including labs, vital signs, imaging, progress notes, orders, and available advanced directive documents from current and previous encounters.    Visited with Mr. Malecki at her bedside. Awake and alert, able to engage in conversations. Complaining of increased pain. In collaboration with primary team, plan to increase frequency of Norco to every 4 hours as needed, encouraged to  utilize IV Morphine as needed for severe pain. Discussed being proactive with pain management with Ms. Eyer, avoiding waiting until pain is severe to better manage symptoms.  Attempted to again elicit goals of care conversations. We again discussed code status-again shared during last admissions it seems Ms. Henion agreed to Do Not Resuscitate, neither she or Homero Fellers can recall this and again are clear for her to remain Full Code.  Attempted to discuss overall poor prognosis related to chronic wounds functional decline, malnutrition now complicated by osteomyelitis. During conversations related to prognosis, Ms. Klahr prefers to discuss frustrations related to care she received in outpatient wound clinic. She voices frustrations that providers ignored her concerns for possible infection for many weeks and feels if treatment was started earlier she would not be in the hospital. Homero Fellers shares he would prefer to have Code Status conversations with Ms. Zietlow's PCP once they leave the hospital. They again share they are not interested in outpatient  palliative care services at discharge.  Answered and addressed all questions and concerns.  Care plan was discussed with primary team.   Thank you for allowing the Palliative Medicine Team to assist in the care of this patient.  Total time:  35 minutes  Time spent includes: Detailed review of medical records (labs, imaging, vital signs), medically appropriate exam (mental status, respiratory, cardiac, skin), discussed with treatment team, counseling and educating patient, family and staff, documenting clinical information, medication management and coordination of care.  Leeanne Deed, DNP, AGNP-C Palliative Medicine   Please contact Palliative Medicine Team phone at [phone] for questions and concerns.

## 2023-05-26 NOTE — Progress Notes (Signed)
   PODIATRY PROGRESS NOTE  NAME RIAH FELLENZ MRN [id number] DOB 1941-03-03 DOA 05/22/2023   Chief Complaint  Patient presents with   Wound Infection    82 y.o. female admitted for worsening stage III-4 ulcers to the lower extremities and left hip.  Positive for osteomyelitis.  Poor surgical candidate.  Long-term vancomycin and Zosyn. Vascular team unable to perform angiogram given lower extremity contracture.  MR FOOT RT W WO CONTRAST 05/24/2023 IMPRESSION: 1. Acute osteomyelitis of the posterior calcaneal body. 2. Bone marrow edema within the medial malleolus, evaluation at this site is limited by adjacent susceptibility artifact and regionally poor fat saturation. Findings are suspicious for acute osteomyelitis. 3. Soft tissue ulcerations at the posterior heel and overlying the medial malleolus. 4. Achilles tendinosis.  MR PELVIS W WO CONTRAST 05/22/2023 IMPRESSION: 1. Large skin wound measuring up to 4 cm at the level of the right greater trochanter with exposed bone and abnormal marrow signal of the great toe trochanter consistent with osteomyelitis. 2. Midline sacral ulcer with subjacent enhancing granulation tissues and collection measuring 3.4 x 3.5 x 3.0 cm.  ASSESSMENT/PLAN OF CARE Osteomyelitis with chronic ulcers B/L heels. LT hip  -Recommend conservative palliative care.   -Continue IV vancomycin and Zosyn -Resume f/u care at [facility] wound care center -Continue Prevalon boot and wound care orders -Podiatry will sign off  Please contact me directly via secure chat with any questions or concerns.     Felecia Shelling, DPM [facility]  Dr. Felecia Shelling, DPM    [address] [place], [place] [address]                Office [phone]  Fax [phone]

## 2023-05-26 NOTE — Progress Notes (Signed)
Progress Note   Patient: [name] WGN:[id number] DOB: 1941/08/06 DOA: 05/22/2023     4 DOS: the patient was seen and examined on 05/26/2023   Brief hospital course: [name] is a 82 y.o. 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  Patient reports these have been present since approximately January.  Patient follows up at wound care clinic.  Patient was seen for a routine evaluation today.  Patient's wounds were felt to be worsening and possibly infected started antibiotics, admitted to hospitalist service.  Podiatry team suggested MRI foot, vascular eval. Vascular  suggested CTA abd/ pelvis, not a candidate for angiogram. Her Hb she got a unit of blood. MRI pelvis, left foot showed osteomyelitis. Podiatry advised conservative management. Palliative care discussed with family regarding goals of care, she wants to be Full code. Continues to be in pain, has poor nutrition, poor quality of life.  Assessment and Plan: * Cellulitis- multiple open wounds on extremities. Osteomyelitis greater trochanter, left foot Stage 3-4 ulcers over heels, sacrum, left hip, lower extremities. Patient is sent in from her wound clinic for her chronic wounds due to concern of infection. She does have osteomyelitis as evidenced by MRI. She is poor surgical candidate. I discussed with husband, palliative team had goals of care discussion currently managing pain. She likely has to go home with home health for antibiotics. I will discuss with ID for appropriate long term antibiotics. Continue with vancomycin and Zosyn. Podiatry team follow up appreciated.   Anemia of chronic disease This is a chronic diagnosis.  Hb at 6.5 trended upto 8.2 s/p a unit of blood. Iron level low. continue IV iron therapy. Repeat H/H tomorrow. Will transfuse for Hb greater than 7.  Malnutrition of Severe degree Has a diagnosis previously documented in the  chart.  She has skin ulcers, BMI 19. Nutrition evaluation appreciated. Palliative to continue goals of care discussion.  Hypertension Blood pressure lower side, caution with antihypertensives.  Diabetes mellitus, type 2 (HCC) Continue accucheks, insulin sliding scale  Overall prognosis poor. She will need long term antibiotics. Conservative management.    Subjective: Patient seen and examined today morning.  She is lying in bed, husband and palliative team at bedside. She complains of pain in the left foot. Wishes to get more pain meds. Eating poor.  Physical Exam: Vitals:   05/26/23 0743 05/26/23 0754 05/26/23 1009 05/26/23 1039  BP:  132/73    Pulse:  80    Resp: 19 16 16 16   Temp:  (!) 97.2 F (36.2 C)    TempSrc:      SpO2:  95%    Weight:      Height:       General - Elderly cachectic Caucasian female, in distress due to pain HEENT - PERRLA, EOMI, atraumatic head, non tender sinuses. Lung - Clear, rales, rhonchi, wheezes. Heart - S1, S2 heard, no murmurs, rubs, trace pedal edema Neuro -alert, awake, non focal exam. Skin - Warm and dry.  Multiple skin ulcers as noted above Data Reviewed:  CBC, BMP, blood sugars  Family Communication: Updated husband regarding current care, advised goals of care discussion.  Disposition: Status is: Inpatient Remains inpatient appropriate because: multiple skin ulcers need IV antibiotics, goals of care, pain control.  Planned Discharge Destination: Skilled nursing facility vs home health    Time spent: 44 minutes  Author: Marcelino Duster, MD 05/26/2023 4:34 PM  For on call review [email or web address].

## 2023-05-27 ENCOUNTER — Ambulatory Visit: Payer: PPO | Admitting: Physician Assistant

## 2023-05-27 DIAGNOSIS — D638 Anemia in other chronic diseases classified elsewhere: Secondary | ICD-10-CM | POA: Diagnosis not present

## 2023-05-27 DIAGNOSIS — L89229 Pressure ulcer of left hip, unspecified stage: Secondary | ICD-10-CM

## 2023-05-27 DIAGNOSIS — L97512 Non-pressure chronic ulcer of other part of right foot with fat layer exposed: Secondary | ICD-10-CM | POA: Diagnosis not present

## 2023-05-27 DIAGNOSIS — L89619 Pressure ulcer of right heel, unspecified stage: Secondary | ICD-10-CM | POA: Diagnosis not present

## 2023-05-27 DIAGNOSIS — L89629 Pressure ulcer of left heel, unspecified stage: Secondary | ICD-10-CM

## 2023-05-27 DIAGNOSIS — Z86718 Personal history of other venous thrombosis and embolism: Secondary | ICD-10-CM

## 2023-05-27 DIAGNOSIS — T148XXA Other injury of unspecified body region, initial encounter: Secondary | ICD-10-CM | POA: Diagnosis not present

## 2023-05-27 DIAGNOSIS — E440 Moderate protein-calorie malnutrition: Secondary | ICD-10-CM | POA: Diagnosis not present

## 2023-05-27 DIAGNOSIS — L89219 Pressure ulcer of right hip, unspecified stage: Secondary | ICD-10-CM | POA: Diagnosis not present

## 2023-05-27 LAB — CULTURE, BLOOD (ROUTINE X 2)
Culture: NO GROWTH
Culture: NO GROWTH
Special Requests: ADEQUATE

## 2023-05-27 LAB — GLUCOSE, CAPILLARY
Glucose-Capillary: 91 mg/dL (ref 70–99)
Glucose-Capillary: 149 mg/dL — ABNORMAL HIGH (ref 70–99)
Glucose-Capillary: 102 mg/dL — ABNORMAL HIGH (ref 70–99)
Glucose-Capillary: 79 mg/dL (ref 70–99)

## 2023-05-27 LAB — CBC
HCT: 28 % — ABNORMAL LOW (ref 36.0–46.0)
Hemoglobin: 8.4 g/dL — ABNORMAL LOW (ref 12.0–15.0)
MCH: 26.4 pg (ref 26.0–34.0)
MCHC: 30 g/dL (ref 30.0–36.0)
MCV: 88.1 fL (ref 80.0–100.0)
Platelets: 511 10*3/uL — ABNORMAL HIGH (ref 150–400)
RBC: 3.18 MIL/uL — ABNORMAL LOW (ref 3.87–5.11)
RDW: 17.7 % — ABNORMAL HIGH (ref 11.5–15.5)
WBC: 10.1 10*3/uL (ref 4.0–10.5)
nRBC: 0.3 % — ABNORMAL HIGH (ref 0.0–0.2)

## 2023-05-27 LAB — PROTEIN ELECTROPHORESIS, SERUM
A/G Ratio: 0.6 — ABNORMAL LOW (ref 0.7–1.7)
Albumin ELP: 2.2 g/dL — ABNORMAL LOW (ref 2.9–4.4)
Alpha-1-Globulin: 0.4 g/dL (ref 0.0–0.4)
Alpha-2-Globulin: 1 g/dL (ref 0.4–1.0)
Beta Globulin: 1 g/dL (ref 0.7–1.3)
Gamma Globulin: 1.3 g/dL (ref 0.4–1.8)
Globulin, Total: 3.6 g/dL (ref 2.2–3.9)
Total Protein ELP: 5.8 g/dL — ABNORMAL LOW (ref 6.0–8.5)

## 2023-05-27 LAB — VANCOMYCIN, RANDOM: Vancomycin Rm: >60 ug/mL

## 2023-05-27 MED ORDER — VANCOMYCIN VARIABLE DOSE PER UNSTABLE RENAL FUNCTION (PHARMACIST DOSING): Status: DC

## 2023-05-27 NOTE — Progress Notes (Signed)
  Progress Note   Patient: [name] QMV:[id number] DOB: January 28, 1941 DOA: 05/22/2023     5 DOS: the patient was seen and examined on 05/27/2023   Brief hospital course: [name] is a 82 y.o. 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  Patient reports these have been present since approximately January.  Patient follows up at wound care clinic.  Patient was seen for a routine evaluation today.  Patient's wounds were felt to be worsening and possibly infected started antibiotics, admitted to hospitalist service.  Podiatry team suggested MRI foot, vascular eval. Vascular  suggested CTA abd/ pelvis, not a candidate for angiogram. Her Hb she got a unit of blood. MRI pelvis, left foot showed osteomyelitis. Podiatry advised conservative management. Palliative care discussed with family regarding goals of care, she wants to be Full code. Continues to be in pain, has poor nutrition, poor quality of life.  Assessment and Plan: * Cellulitis- multiple open wounds on extremities. Osteomyelitis greater trochanter, left foot Stage 3-4 ulcers over heels, sacrum, left hip, lower extremities. Patient is sent in from her wound clinic for her chronic wounds due to concern of infection. She does have osteomyelitis as evidenced by MRI. She is poor surgical candidate, podiatry advised conservative management.  ID consulted for appropriate long term antibiotics.  Vancomycin + zosyn while inpatient.   Anemia of chronic disease This is a chronic diagnosis.  Hb at 6.5 trended upto 8.4 s/p a unit of blood. Continue IV iron therapy. Transfuse for Hb greater than 7.  Malnutrition of Severe degree Has a diagnosis previously documented in the chart.  She has skin ulcers, BMI 19. Nutrition evaluation appreciated. Palliative to continue goals of care discussion.  Hypertension Blood pressure lower side, caution with  antihypertensives.  Diabetes mellitus, type 2 (HCC) Continue accucheks, insulin sliding scale  Overall prognosis poor. I discussed with patient and her husband regarding goals of care advised DNR, comfort care only. She will need long term possibly oral antibiotics. Conservative management.    Subjective: Patient seen and examined today morning.  She is lying in bed, husband and palliative team at bedside. She complains of pain in the left foot. Wishes to get more pain meds. Eating poor.  Physical Exam: Vitals:   05/26/23 1725 05/26/23 1933 05/27/23 0317 05/27/23 0722  BP:  117/64 120/70 (!) 149/75  Pulse:  73 72 75  Resp: 18 16 16 16   Temp:  99.1 F (37.3 C) 98.2 F (36.8 C) 98.3 F (36.8 C)  TempSrc:  Oral Oral Oral  SpO2:  100% 96% 97%  Weight:      Height:       General - Elderly cachectic Caucasian female, in distress due to pain HEENT - PERRLA, EOMI, atraumatic head, non tender sinuses. Lung - Clear, rales, rhonchi, wheezes. Heart - S1, S2 heard, no murmurs, rubs, trace pedal edema Neuro -alert, awake, non focal exam. Skin - Warm and dry.  Multiple skin ulcers as noted above Data Reviewed:  CBC, blood sugars  Family Communication: Updated husband regarding current care, advised goals of care discussion.  Disposition: Status is: Inpatient Remains inpatient appropriate because: multiple skin ulcers need IV antibiotics, goals of care, pain control, ID eval.  Planned Discharge Destination: Patient wishes to go home.    Time spent: 46 minutes  Author: Marcelino Duster, MD 05/27/2023 4:35 PM  For on call review [email or web address].

## 2023-05-27 NOTE — Consult Note (Signed)
Pharmacy Antibiotic Note  [name] is a 82 y.o. female w/ PMH of gout, HLD, DM, peripheral neuropathy, hypothyroidism, ulceration of bony prominences, HTN admitted on 05/22/2023 with worsening of chronic wounds. Imaging ordered. Pharmacy has been consulted for vancomycin and Zosyn dosing. Today is day #5 of broad-spectrum antibiotics. A vancomycin level was drawn this morning vancomycin level: > 60 mcg/mL  Unfortunately the level was drawn during the infusion/vancomycin dose  Plan:  1) continue Zosyn 3.375g IV q8h (4 hour infusion).  2) change vancomycin to a variable placeholder ---will order a follow-up vancomycin level in am 07/16   Height: 4\' 11"  (149.9 cm) Weight: 44.5 kg (98 lb) IBW/kg (Calculated) : 43.2  Temp (24hrs), Avg:98.2 F (36.8 C), Min:97.2 F (36.2 C), Max:99.1 F (37.3 C)  Recent Labs  Lab 05/22/23 1627 05/22/23 1928 05/22/23 2342 05/23/23 0538 05/24/23 0619 05/25/23 0601 05/26/23 0435 05/27/23 0519  WBC 13.9*  --  13.9* 12.4* 9.5 11.3*  --  10.1  CREATININE 0.67  --  0.69 0.59 0.75  --  0.75  --   LATICACIDVEN 1.9 2.5*  --   --   --   --   --   --     Estimated Creatinine Clearance: 38.3 mL/min (by C-G formula based on SCr of 0.75 mg/dL).    Allergies  Allergen Reactions   Other Anaphylaxis    Anesthisia has been a health issue for her in the past.    Sulfa Antibiotics Rash    Antimicrobials this admission: 7/10 vancomycin >>  7/10 Zosyn >>   Microbiology results: 7/10 BCx: NG final   Thank you for allowing pharmacy to be a part of this patient's care.  Lowella Bandy, PharmD, BCPS Clinical Pharmacist   05/27/2023 7:12 AM

## 2023-05-27 NOTE — Plan of Care (Signed)
   Problem: Coping: Goal: Ability to adjust to condition or change in health will improve Outcome: Progressing   

## 2023-05-27 NOTE — Consult Note (Signed)
NAME: [name]  DOB: February 14, 1941  MRN: [id number]  Date/Time: 05/27/2023 10:29 AM  REQUESTING PROVIDER: Dr.Sreeram Subjective:  REASON FOR CONSULT: multiple wounds ? [name] is a 82 y.o. with a history of hypertension, diabetes mellitus, anemia, CHF, CA breast status post right mastectomy, hypothyroidism, right ankle arthrodesis, right heel decubitus ulcer, status full-thickness  debridement in December 2023, extensive iliofemoral left leg DVT status post mechanical thrombectomy and IVC filter placement 11/06/2022, with history of cystocele and urinary retention and treated Enterococcus bacteremia in February 2024, multiple pressure wounds hips she will legs for which she received IV antibiotics between 01/05/2023 until 01/15/2023 and then completed 4 weeks of p.o. Augmentin.  Followed at the wound clinic and last seen by me on 03/28/2023 when the wounds were all stable  presented to the ED on 05/22/2023 with worsening of the wounds.  She had been assessed at the wound clinic in the hospital go to the ED. Vitals in the ED BP of 123/62 temperature 98.5, pulse 97, respiratory 18 and sats 99%.  WBC 13.9, Hb 7.7, platelets 699 creatinine of 0.67.   MRI of the pelvis revealed large skin wound measuring up to 4 cm at the level of the right greater trochanter with exposed bone and abnormal marrow signal of the greater trochanter consistent with osteomyelitis.  There was a midline sacral ulcer with subjacent enhancing granulation tissue and collection measuring 3.4 into 3.23 cm.  The rt foot MRI showed acute osteomyelitis of the posterior calcaneal body. I am asked to see the patient for these wounds Pt says she is pretty much the same No fever or chills Past Medical History:  Diagnosis Date   Arthritis    Arthritis    Breast cancer (HCC) 1992   right breast with lumpectomy and rad tx   Cancer of right breast (HCC) 04/16/2013   right breast with mastectomy   Diabetes mellitus without  complication (HCC)    Gout    High cholesterol    Hyperlipidemia    Hypertension    Personal history of radiation therapy     Past Surgical History:  Procedure Laterality Date   ABDOMINAL HYSTERECTOMY     ANKLE FRACTURE SURGERY Right 2004   BREAST LUMPECTOMY Right 1992   positive   IRRIGATION AND DEBRIDEMENT FOOT Right 11/02/2022   Procedure: IRRIGATION AND DEBRIDEMENT RIGHT FOOT AND BONE BIOPSY RIGHT FOOT;  Surgeon: Gwyneth Revels, DPM;  Location: [facility];  Service: Podiatry;  Laterality: Right;   IVC FILTER INSERTION N/A 11/06/2022   Procedure: IVC FILTER INSERTION;  Surgeon: Annice Needy, MD;  Location: [facility];  Service: Cardiovascular;  Laterality: N/A;   MASTECTOMY Right 2014   PERIPHERAL VASCULAR THROMBECTOMY Left 11/06/2022   Procedure: PERIPHERAL VASCULAR THROMBECTOMY- EXTERNAL Iliac Vein/CFV;  Surgeon: Annice Needy, MD;  Location: [facility];  Service: Cardiovascular;  Laterality: Left;   SHOULDER SURGERY Right 2010    Social History   Socioeconomic History   Marital status: Married    Spouse name: Not on file   Number of children: 2   Years of education: Not on file   Highest education level: Not on file  Occupational History   Not on file  Tobacco Use   Smoking status: Never    Passive exposure: Never   Smokeless tobacco: Never  Vaping Use   Vaping status: Never Used  Substance and Sexual Activity   Alcohol use: No    Alcohol/week: 0.0 standard drinks of  alcohol   Drug use: No   Sexual activity: Not Currently  Other Topics Concern   Not on file  Social History Narrative   Not on file   Social Determinants of Health   Financial Resource Strain: Not on file  Food Insecurity: No Food Insecurity (05/22/2023)   Hunger Vital Sign    Worried About Running Out of Food in the Last Year: Never true    Ran Out of Food in the Last Year: Never true  Transportation Needs: No Transportation Needs (05/22/2023)   PRAPARE - Therapist, art (Medical): No    Lack of Transportation (Non-Medical): No  Physical Activity: Not on file  Stress: Not on file  Social Connections: Not on file  Intimate Partner Violence: Not At Risk (05/22/2023)   Humiliation, Afraid, Rape, and Kick questionnaire    Fear of Current or Ex-Partner: No    Emotionally Abused: No    Physically Abused: No    Sexually Abused: No    Family History  Problem Relation Age of Onset   Diabetes Father    Breast cancer Neg Hx    Allergies  Allergen Reactions   Other Anaphylaxis    Anesthisia has been a health issue for her in the past.    Sulfa Antibiotics Rash   I? Current Facility-Administered Medications  Medication Dose Route Frequency Provider Last Rate Last Admin   0.9 %  sodium chloride infusion   Intravenous Continuous Nolberto Hanlon, MD 75 mL/hr at 05/26/23 2348 Infusion Verify at 05/26/23 2348   0.9 %  sodium chloride infusion   Intravenous PRN Marcelino Duster, MD   Stopped at 05/26/23 2129   acetaminophen (TYLENOL) tablet 650 mg  650 mg Oral Q6H PRN Nolberto Hanlon, MD   650 mg at 05/25/23 1507   Or   acetaminophen (TYLENOL) suppository 650 mg  650 mg Rectal Q6H PRN Nolberto Hanlon, MD       allopurinol (ZYLOPRIM) tablet 300 mg  300 mg Oral Daily Nolberto Hanlon, MD   300 mg at 05/27/23 6433   ascorbic acid (VITAMIN C) tablet 500 mg  500 mg Oral BID Marcelino Duster, MD   500 mg at 05/27/23 2951   aspirin EC tablet 81 mg  81 mg Oral Daily Nolberto Hanlon, MD   81 mg at 05/27/23 0828   atenolol (TENORMIN) tablet 50 mg  50 mg Oral Daily Marcelino Duster, MD   50 mg at 05/27/23 0828   atorvastatin (LIPITOR) tablet 40 mg  40 mg Oral Daily Nolberto Hanlon, MD   40 mg at 05/27/23 8841   Chlorhexidine Gluconate Cloth 2 % PADS 6 each  6 each Topical Daily Marcelino Duster, MD   6 each at 05/26/23 1005   collagenase (SANTYL) ointment   Topical Daily Nolberto Hanlon, MD   Given at 05/27/23 6606   cyanocobalamin (VITAMIN B12) tablet 1,000 mcg   1,000 mcg Oral Daily Nolberto Hanlon, MD   1,000 mcg at 05/27/23 0827   enoxaparin (LOVENOX) injection 40 mg  40 mg Subcutaneous Q24H Nolberto Hanlon, MD   40 mg at 05/27/23 0830   feeding supplement (ENSURE ENLIVE / ENSURE PLUS) liquid 237 mL  237 mL Oral TID BM Marcelino Duster, MD   237 mL at 05/27/23 1018   gabapentin (NEURONTIN) capsule 100 mg  100 mg Oral Daily Nolberto Hanlon, MD   100 mg at 05/27/23 0827   HYDROcodone-acetaminophen (NORCO) 7.5-325 MG per tablet 1 tablet  1 tablet Oral Q4H  PRN Theotis Burrow, NP   1 tablet at 05/27/23 2130   insulin aspart (novoLOG) injection 0-5 Units  0-5 Units Subcutaneous QHS Nolberto Hanlon, MD       insulin aspart (novoLOG) injection 0-9 Units  0-9 Units Subcutaneous TID WC Nolberto Hanlon, MD   1 Units at 05/26/23 1228   levothyroxine (SYNTHROID) tablet 88 mcg  88 mcg Oral Q0600 Nolberto Hanlon, MD   88 mcg at 05/27/23 0428   morphine (PF) 2 MG/ML injection 2 mg  2 mg Intravenous Q4H PRN Marcelino Duster, MD   2 mg at 05/26/23 1655   multivitamin with minerals tablet 1 tablet  1 tablet Oral Daily Marcelino Duster, MD   1 tablet at 05/27/23 0828   piperacillin-tazobactam (ZOSYN) IVPB 3.375 g  3.375 g Intravenous Tommie Raymond, MD 12.5 mL/hr at 05/27/23 0505 3.375 g at 05/27/23 0505   polyethylene glycol (MIRALAX / GLYCOLAX) packet 17 g  17 g Oral Daily PRN Nolberto Hanlon, MD       polyethylene glycol (MIRALAX / GLYCOLAX) packet 17 g  17 g Oral Daily Nolberto Hanlon, MD   17 g at 05/27/23 8657   thiamine (VITAMIN B1) injection 100 mg  100 mg Intravenous Daily Nolberto Hanlon, MD   100 mg at 05/27/23 8469   vancomycin (VANCOCIN) IVPB 1000 mg/200 mL premix  1,000 mg Intravenous Q36H Lowella Bandy, RPH 200 mL/hr at 05/27/23 0427 1,000 mg at 05/27/23 0427   zinc sulfate capsule 220 mg  220 mg Oral Daily Marcelino Duster, MD   220 mg at 05/27/23 0827     Abtx:  Anti-infectives (From admission, onward)    Start     Dose/Rate Route Frequency Ordered Stop    05/24/23 0600  vancomycin (VANCOCIN) IVPB 1000 mg/200 mL premix        1,000 mg 200 mL/hr over 60 Minutes Intravenous Every 36 hours 05/23/23 1103     05/23/23 2000  vancomycin (VANCOREADY) IVPB 750 mg/150 mL  Status:  Discontinued        750 mg 150 mL/hr over 60 Minutes Intravenous Every 24 hours 05/22/23 1952 05/23/23 1103   05/22/23 2200  piperacillin-tazobactam (ZOSYN) IVPB 3.375 g        3.375 g 12.5 mL/hr over 240 Minutes Intravenous Every 8 hours 05/22/23 1947     05/22/23 1815  ceFEPIme (MAXIPIME) 2 g in sodium chloride 0.9 % 100 mL IVPB  Status:  Discontinued        2 g 200 mL/hr over 30 Minutes Intravenous  Once 05/22/23 1808 05/22/23 1947   05/22/23 1815  vancomycin (VANCOCIN) IVPB 1000 mg/200 mL premix        1,000 mg 200 mL/hr over 60 Minutes Intravenous  Once 05/22/23 1813 05/23/23 0322       REVIEW OF SYSTEMS:  Const: negative fever, negative chills, ++ weight loss Eyes: negative diplopia or visual changes, negative eye pain ENT: negative coryza, negative sore throat Resp: negative cough, hemoptysis, dyspnea Cards: negative for chest pain, palpitations, lower extremity edema GU: negative for frequency, dysuria and hematuria GI: Negative for abdominal pain, diarrhea, bleeding, constipation Skin: negative for rash and pruritus Heme: negative for easy bruising and gum/nose bleeding MS: generalized weakness Neurolo:negative for headaches, dizziness, vertigo, memory problems  Psych: negative for feelings of anxiety, depression  Endocrine: negative for thyroid, diabetes Allergy/Immunology- sulfa ?  Objective:  VITALS:  BP (!) 149/75 (BP Location: Left Arm)   Pulse 75   Temp 98.3 F (36.8 C) (  Oral)   Resp 16   Ht 4\' 11"  (1.499 m)   Wt 44.5 kg   SpO2 97%   BMI 19.79 kg/m   PHYSICAL EXAM:  General: Alert, cooperative, no distress, frail, pale , chronically ill Head: Normocephalic, without obvious abnormality, atraumatic. Eyes: Conjunctivae clear, anicteric  sclerae. Pupils are equal ENT Nares normal. No drainage or sinus tenderness. Lips, mucosa, and tongue normal. No Thrush Neck: Supple, symmetrical, no adenopathy, thyroid: non tender no carotid bruit and no JVD. Lungs: Clear to auscultation bilaterally. No Wheezing or Rhonchi. No rales. Heart: Regular rate and rhythm, no murmur, rub or gallop. Abdomen: Soft, non-tender,not distended. Bowel sounds normal. No masses Extremities: atraumatic, no cyanosis. No edema. No clubbing Skin:              On examining the wounds they are pretty much the same like in 03/28/23 No rashes or lesions. Or bruising Lymph: Cervical, supraclavicular normal. Neurologic: Grossly non-focal Pertinent Labs Lab Results CBC    Component Value Date/Time   WBC 10.1 05/27/2023 0519   RBC 3.18 (L) 05/27/2023 0519   HGB 8.4 (L) 05/27/2023 0519   HGB 12.0 10/01/2014 1119   HCT 28.0 (L) 05/27/2023 0519   HCT 36.5 10/01/2014 1119   PLT 511 (H) 05/27/2023 0519   PLT 233 10/01/2014 1119   MCV 88.1 05/27/2023 0519   MCV 90 10/01/2014 1119   MCH 26.4 05/27/2023 0519   MCHC 30.0 05/27/2023 0519   RDW 17.7 (H) 05/27/2023 0519   RDW 14.6 (H) 10/01/2014 1119   LYMPHSABS 2.0 05/22/2023 1627   LYMPHSABS 2.7 10/01/2014 1119   MONOABS 0.5 05/22/2023 1627   MONOABS 0.5 10/01/2014 1119   EOSABS 0.2 05/22/2023 1627   EOSABS 0.1 10/01/2014 1119   BASOSABS 0.1 05/22/2023 1627   BASOSABS 0.1 10/01/2014 1119       Latest Ref Rng & Units 05/26/2023    4:35 AM 05/24/2023    6:19 AM 05/23/2023    5:38 AM  CMP  Glucose 70 - 99 mg/dL  914  782   BUN 8 - 23 mg/dL  16  19   Creatinine 9.56 - 1.00 mg/dL 2.13  0.86  5.78   Sodium 135 - 145 mmol/L  139  137   Potassium 3.5 - 5.1 mmol/L  3.9  3.7   Chloride 98 - 111 mmol/L  109  107   CO2 22 - 32 mmol/L  24  22   Calcium 8.9 - 10.3 mg/dL  7.6  8.1       Microbiology: Recent Results (from the past 240 hour(s))  Culture, blood (Routine X 2) w Reflex to ID Panel      Status: None   Collection Time: 05/22/23  4:27 PM   Specimen: BLOOD  Result Value Ref Range Status   Specimen Description BLOOD RIGHT ANTECUBITAL  Final   Special Requests   Final    BOTTLES DRAWN AEROBIC AND ANAEROBIC Blood Culture adequate volume   Culture   Final    NO GROWTH 5 DAYS Performed at [facility], [address] [place], [place] [address]    Report Status 05/27/2023 FINAL  Final  Culture, blood (Routine X 2) w Reflex to ID Panel     Status: None   Collection Time: 05/22/23 11:42 PM   Specimen: BLOOD  Result Value Ref Range Status   Specimen Description BLOOD BLOOD RIGHT ARM  Final   Special Requests   Final    BOTTLES DRAWN AEROBIC  AND ANAEROBIC Blood Culture results may not be optimal due to an excessive volume of blood received in culture bottles   Culture   Final    NO GROWTH 5 DAYS Performed at [facility], [address] [place], [place] [address]    Report Status 05/27/2023 FINAL  Final    IMAGING RESULTS: MRI of the pelvis revealed large skin wound measuring up to 4 cm at the level of the right greater trochanter with exposed bone and abnormal marrow signal of the greater trochanter consistent with osteomyelitis.   I have personally reviewed the films ? Impression/Recommendation Multiple pressure wounds  right hip, left hip, bilateral heels. They do look pretty much the same like before.  The one on the hip is stage IV She has had these wound since March 2024 and previously had been on antibiotics for 4 to 5 weeks .She is followed at wound clinic and they referred the patient to the hospital Will send cultures May do couple of weeks of IV antibiotics.  Anemia  Malnutrition/hypoalbuminemia History of CAD epigastric status post right mastectomy  History of extensive iliofemoral DVT of the left side status post mechanical thrombectomy and IVC placement in November 06, 2022 ? ? ___________________________________________________ Discussed with patient, requesting provider Note:  This document was prepared using Dragon voice recognition software and may include unintentional dictation errors.

## 2023-05-27 NOTE — TOC Progression Note (Signed)
Transition of Care [facility]) - Progression Note    Patient Details  Name: SHATERRICA TERRITO MRN: [id number] Date of Birth: [birth year]  Transition of Care [facility] CM/SW Contact  Margarito Liner, LCSW Phone Number: 05/27/2023, 12:11 PM  Clinical Narrative: Per notes, plan for potential long-term IV abx. ID consult pending.    Expected Discharge Plan: Home/Self Care Barriers to Discharge: Continued Medical Work up  Expected Discharge Plan and Services     Post Acute Care Choice: NA Living arrangements for the past 2 months: Single Family Home                                       Social Determinants of Health (SDOH) Interventions SDOH Screenings   Food Insecurity: No Food Insecurity (05/22/2023)  Housing: Low Risk  (05/22/2023)  Transportation Needs: No Transportation Needs (05/22/2023)  Utilities: Not At Risk (05/22/2023)  Depression (PHQ2-9): Low Risk  (03/28/2023)  Tobacco Use: Low Risk  (05/22/2023)    Readmission Risk Interventions    05/23/2023   10:56 AM 12/24/2022   11:39 AM 11/07/2022    4:05 PM  Readmission Risk Prevention Plan  Transportation Screening Complete Complete Complete  Medication Review (RN Care Manager) Complete Complete Complete  PCP or Specialist appointment within 3-5 days of discharge Complete    HRI or Home Care Consult  Complete   SW Recovery Care/Counseling Consult Complete    Palliative Care Screening Not Applicable    Skilled Nursing Facility Not Applicable Not Applicable Complete

## 2023-05-27 NOTE — Progress Notes (Signed)
Occupational Therapy Treatment Patient Details Name: [name] MRN: [id number] DOB: [birth year] Today's Date: 05/27/2023   History of present illness DESHONNA TRNKA is a 82 y.o. 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  Patient reports these have been present since approximately January.  Patient follows up at wound care clinic.  Patient was seen for a routine evaluation today.  Patient's wounds were felt to be worsening and possibly infected.  Therefore patient is sent to the ER today.   OT comments  Pt seen for OT treatment on this date. Upon arrival to room pt supine with spouse present, agreeable to OT Tx session. OT facilitated ADL management as described below. See ADL section for additional details regarding occupational performance. Pt continues to be functionally limited by pain, multiple pressure wounds and mobility deficits at baseline. Tolerates sitting EOB unsupported for 12+ mins for AROM and education of pressure wound relief bed-level. Would potentially benefit from pressure relief mattress at home. Pt return verbalizes understanding of education provided t/o session. Pt is progressing toward OT goals and continues to benefit from skilled OT services to maximize return to PLOF and minimize risk of future falls, injury, caregiver burden, and readmission. Will continue to follow POC as written. May need to consider reassessing in the next 1-2 sessions to see if pt at functional baseline.     Recommendations for follow up therapy are one component of a multi-disciplinary discharge planning process, led by the attending physician.  Recommendations may be updated based on patient status, additional functional criteria and insurance authorization.    Assistance Recommended at Discharge Frequent or constant Supervision/Assistance  Patient can return home with the following  A lot of help with walking and/or  transfers;Assistance with cooking/housework;Assist for transportation;Help with stairs or ramp for entrance;A lot of help with bathing/dressing/bathroom;Direct supervision/assist for medications management   Equipment Recommendations  Other (comment)       Precautions / Restrictions Precautions Precautions: Fall Precaution Comments: Multiple wounds Restrictions Weight Bearing Restrictions: No Other Position/Activity Restrictions: many significant pressure wounds       Mobility Bed Mobility Overal bed mobility: Needs Assistance Bed Mobility: Supine to Sit, Sit to Supine Rolling: Mod assist   Supine to sit: Mod assist Sit to supine: Mod assist   General bed mobility comments: Sits EOB without support, needs increased time due to pain/knee and hip contractures    Transfers                   General transfer comment: Did not attempt transfer this date, pt refuses due to pain         ADL either performed or assessed with clinical judgement   ADL Overall ADL's : Needs assistance/impaired     Grooming: Wash/dry face;Wash/dry hands;Brushing hair;Supervision/safety;Sitting Grooming Details (indicate cue type and reason): Sits EOB to wash face/hands                               General ADL Comments: Pt tolerates sitting EOB for 12+ mins for ADL task    Extremity/Trunk Assessment Upper Extremity Assessment Upper Extremity Assessment: LUE deficits/detail (Pt states difficulties with LUE baseline)             Cognition Arousal/Alertness: Awake/alert Behavior During Therapy: WFL for tasks assessed/performed Overall Cognitive Status: Within Functional Limits for tasks assessed  General Comments: HOH        Exercises Exercises: General Upper Extremity General Exercises - Upper Extremity Shoulder Flexion: AROM, Right, 10 reps       General Comments Multiple wounds, limited ROM LUE    Pertinent  Vitals/ Pain       Pain Assessment Pain Assessment: 0-10 Pain Score: 7    Frequency  Min 1X/week        Progress Toward Goals  OT Goals(current goals can now be found in the care plan section)  Progress towards OT goals: Progressing toward goals  Acute Rehab OT Goals OT Goal Formulation: With patient Time For Goal Achievement: 06/06/23  Plan Discharge plan needs to be updated       AM-PAC OT "6 Clicks" Daily Activity     Outcome Measure   Help from another person eating meals?: None Help from another person taking care of personal grooming?: A Little Help from another person toileting, which includes using toliet, bedpan, or urinal?: Total Help from another person bathing (including washing, rinsing, drying)?: A Lot Help from another person to put on and taking off regular upper body clothing?: A Lot Help from another person to put on and taking off regular lower body clothing?: Total 6 Click Score: 13    End of Session    OT Visit Diagnosis: Other abnormalities of gait and mobility (R26.89);Muscle weakness (generalized) (M62.81);Pain   Activity Tolerance Patient limited by pain   Patient Left in bed;with call bell/phone within reach;with bed alarm set;with family/visitor present   Nurse Communication          Time: 1100-1131 OT Time Calculation (min): 31 min  Charges: OT General Charges $OT Visit: 1 Visit OT Treatments $Self Care/Home Management : 8-22 mins $Therapeutic Activity: 8-22 mins  Mariya Mottley L. Aaryn Sermon, OTR/L  05/27/23, 1:14 PM

## 2023-05-27 NOTE — Progress Notes (Signed)
Brief Nutrition Follow-Up Note  RD reviewed chart for lab results. Labs were drawn to help assess micronutrient deficiencies secondary to chronic, non-healing wounds. Vitamin A and Vitamin E WDL.   RD will continue to follow pt, reassess, and adjust plan of care as appropriate.   INTERVENTION:    -Continue regular diet for widest variety of meal selections -Continue Ensure Enlive po TID, each supplement provides 350 kcal and 20 grams of protein -Continue MVI with minerals daily -Continue 500 mg vitamin C BID -Continue 220 mg zinc sulfate daily  Levada Schilling, RD, LDN, CDCES Registered Dietitian II Certified Diabetes Care and Education Specialist Please refer to [facility] for RD and/or RD on-call/weekend/after hours pager

## 2023-05-27 NOTE — Progress Notes (Signed)
Critical lab... MD and Pharmacy aware.  [name]

## 2023-05-27 NOTE — Care Management Important Message (Signed)
Important Message  Patient Details  Name: [name] MRN: [id number] Date of Birth: 09-14-1941   Medicare Important Message Given:  Yes     [name] 05/27/2023, 10:34 AM

## 2023-05-28 DIAGNOSIS — L89229 Pressure ulcer of left hip, unspecified stage: Secondary | ICD-10-CM | POA: Diagnosis not present

## 2023-05-28 DIAGNOSIS — L89219 Pressure ulcer of right hip, unspecified stage: Secondary | ICD-10-CM | POA: Diagnosis not present

## 2023-05-28 DIAGNOSIS — L97512 Non-pressure chronic ulcer of other part of right foot with fat layer exposed: Secondary | ICD-10-CM | POA: Diagnosis not present

## 2023-05-28 DIAGNOSIS — D638 Anemia in other chronic diseases classified elsewhere: Secondary | ICD-10-CM | POA: Diagnosis not present

## 2023-05-28 DIAGNOSIS — T148XXA Other injury of unspecified body region, initial encounter: Secondary | ICD-10-CM | POA: Diagnosis not present

## 2023-05-28 DIAGNOSIS — Z7189 Other specified counseling: Secondary | ICD-10-CM | POA: Diagnosis not present

## 2023-05-28 DIAGNOSIS — L89619 Pressure ulcer of right heel, unspecified stage: Secondary | ICD-10-CM | POA: Diagnosis not present

## 2023-05-28 DIAGNOSIS — E440 Moderate protein-calorie malnutrition: Secondary | ICD-10-CM | POA: Diagnosis not present

## 2023-05-28 DIAGNOSIS — L89629 Pressure ulcer of left heel, unspecified stage: Secondary | ICD-10-CM | POA: Diagnosis not present

## 2023-05-28 LAB — GLUCOSE, CAPILLARY
Glucose-Capillary: 91 mg/dL (ref 70–99)
Glucose-Capillary: 99 mg/dL (ref 70–99)
Glucose-Capillary: 181 mg/dL — ABNORMAL HIGH (ref 70–99)
Glucose-Capillary: 81 mg/dL (ref 70–99)

## 2023-05-28 MED ORDER — FERROUS SULFATE 325 (65 FE) MG PO TABS
325.0000 mg | ORAL_TABLET | Freq: Two times a day (BID) | ORAL | Status: DC
  Administered 2023-05-28 – 2023-05-31 (×6): 325 mg via ORAL
  Filled 2023-05-28 (×6): qty 1

## 2023-05-28 MED ORDER — SODIUM CHLORIDE 0.9 % IV SOLN
8.0000 mg/kg | Freq: Every day | INTRAVENOUS | Status: DC
  Administered 2023-05-28 – 2023-05-31 (×4): 350 mg via INTRAVENOUS
  Filled 2023-05-28 (×4): qty 7

## 2023-05-28 NOTE — Progress Notes (Signed)
   Progress Note   Date: 05/28/2023  Patient Name: [name]        MRN#: [id number]   Pressure ulcers - Right hip Stage 4 pressure injury, bilateral heel Stage 3 pressure injuries, Sacral Unstageable pressure injury and left hip Unstageable pressure injury. All ulcers present on admission

## 2023-05-28 NOTE — Progress Notes (Signed)
Date of Admission:  05/22/2023   Total days of antibiotics ***        Day ***        Day ***        Day ***   ID: [name] is a 82 y.o. female with  *** Principal Problem:   Cellulitis Active Problems:   Diabetes mellitus, type 2 (HCC)   Hypertension   Protein-calorie malnutrition, severe (HCC)   Anemia of chronic disease   Right foot ulcer, with fat layer exposed (HCC)    Subjective: ***  Medications:   allopurinol  300 mg Oral Daily   vitamin C  500 mg Oral BID   aspirin EC  81 mg Oral Daily   atenolol  50 mg Oral Daily   atorvastatin  40 mg Oral Daily   Chlorhexidine Gluconate Cloth  6 each Topical Daily   collagenase   Topical Daily   cyanocobalamin  1,000 mcg Oral Daily   enoxaparin (LOVENOX) injection  40 mg Subcutaneous Q24H   feeding supplement  237 mL Oral TID BM   ferrous sulfate  325 mg Oral BID WC   gabapentin  100 mg Oral Daily   insulin aspart  0-5 Units Subcutaneous QHS   insulin aspart  0-9 Units Subcutaneous TID WC   levothyroxine  88 mcg Oral Q0600   multivitamin with minerals  1 tablet Oral Daily   polyethylene glycol  17 g Oral Daily   thiamine (VITAMIN B1) injection  100 mg Intravenous Daily   zinc sulfate  220 mg Oral Daily    Objective: Vital signs in last 24 hours: Patient Vitals for the past 24 hrs:  BP Temp Temp src Pulse Resp SpO2  05/28/23 1640 (!) 103/58 99.1 F (37.3 C) Oral 79 16 92 %  05/28/23 0832 133/72 98.6 F (37 C) Oral 73 16 98 %  05/28/23 0528 117/67 98.6 F (37 C) Oral 73 18 98 %     LDA Foley Central lines Other catheters  PHYSICAL EXAM:  General: Alert, cooperative, no distress, appears stated age.  Head: Normocephalic, without obvious abnormality, atraumatic. Eyes: Conjunctivae clear, anicteric sclerae. Pupils are equal ENT Nares normal. No drainage or sinus tenderness. Lips, mucosa, and tongue normal. No Thrush Neck: Supple, symmetrical, no adenopathy, thyroid: non tender no carotid bruit and  no JVD. Back: No CVA tenderness. Lungs: Clear to auscultation bilaterally. No Wheezing or Rhonchi. No rales. Heart: Regular rate and rhythm, no murmur, rub or gallop. Abdomen: Soft, non-tender,not distended. Bowel sounds normal. No masses Extremities: atraumatic, no cyanosis. No edema. No clubbing Skin: No rashes or lesions. Or bruising Lymph: Cervical, supraclavicular normal. Neurologic: Grossly non-focal  Lab Results    Latest Ref Rng & Units 05/27/2023    5:19 AM 05/25/2023    6:01 AM 05/24/2023    6:19 AM  CBC  WBC 4.0 - 10.5 K/uL 10.1  11.3  9.5   Hemoglobin 12.0 - 15.0 g/dL 8.4  8.2  6.5   Hematocrit 36.0 - 46.0 % 28.0  26.7  22.1   Platelets 150 - 400 K/uL 511  548  581        Latest Ref Rng & Units 05/26/2023    4:35 AM 05/24/2023    6:19 AM 05/23/2023    5:38 AM  CMP  Glucose 70 - 99 mg/dL  161  096   BUN 8 - 23 mg/dL  16  19   Creatinine 0.45 - 1.00 mg/dL 4.09  8.11  0.59   Sodium 135 - 145 mmol/L  139  137   Potassium 3.5 - 5.1 mmol/L  3.9  3.7   Chloride 98 - 111 mmol/L  109  107   CO2 22 - 32 mmol/L  24  22   Calcium 8.9 - 10.3 mg/dL  7.6  8.1       Microbiology:  Studies/Results: No results found.   Assessment/Plan: ***

## 2023-05-28 NOTE — Consult Note (Signed)
Pharmacy Antibiotic Note  [name] is a 82 y.o. female w/ PMH of gout, HLD, DM, peripheral neuropathy, hypothyroidism, ulceration of bony prominences, HTN admitted on 05/22/2023 with worsening of chronic wounds. Imaging ordered. Pharmacy has been consulted for daptomycin and Zosyn dosing.   05/28/2023 Day #6 antibiotics, currently on piperacillin/tazobactam. Starting daptomycin Renal: SCr 0.75 WBC WNL afebrile  Vancomycin levels 7/14: Vancomycin level at 0435 > 60 mcg/mL - Unfortunately the level was drawn during the infusion/vancomycin dose  Plan:  1) continue Zosyn 3.375g IV q8h (4 hour infusion).  2) change vancomycin to daptomycin pending culture results, daptomycin 350mg  (8mg /kg) IV q24h  Checking CK with labs 7/17 then weekly Monitor on atorvastatin 40mg  daily  3) plan at discharge is likely OPAT per ID   Height: 4\' 11"  (149.9 cm) Weight: 44.5 kg (98 lb) IBW/kg (Calculated) : 43.2  Temp (24hrs), Avg:98.6 F (37 C), Min:98.2 F (36.8 C), Max:99.1 F (37.3 C)  Recent Labs  Lab 05/22/23 1627 05/22/23 1928 05/22/23 2342 05/23/23 0538 05/24/23 0619 05/25/23 0601 05/26/23 0435 05/27/23 0519  WBC 13.9*  --  13.9* 12.4* 9.5 11.3*  --  10.1  CREATININE 0.67  --  0.69 0.59 0.75  --  0.75  --   LATICACIDVEN 1.9 2.5*  --   --   --   --   --   --   VANCORANDOM  --   --   --   --   --   --   --  >60*    Estimated Creatinine Clearance: 38.3 mL/min (by C-G formula based on SCr of 0.75 mg/dL).    Allergies  Allergen Reactions   Other Anaphylaxis    Anesthisia has been a health issue for her in the past.    Sulfa Antibiotics Rash    Antimicrobials this admission: 7/10 vancomycin >> 7/15 7/10 Zosyn >>  7/16 dapto >>  Microbiology results: 7/10 BCx: NG final   Thank you for allowing pharmacy to be a part of this patient's care.  Juliette Alcide, PharmD, BCPS, BCIDP Work Cell: [phone] 05/28/2023 4:56 PM

## 2023-05-28 NOTE — Progress Notes (Signed)
Pt refused dressing change.

## 2023-05-28 NOTE — TOC Progression Note (Signed)
Transition of Care [facility]) - Progression Note    Patient Details  Name: [name] MRN: [id number] Date of Birth: April 28, 1941  Transition of Care [facility]) CM/SW Contact  Truddie Hidden, RN Phone Number: 05/28/2023, 10:18 AM  Clinical Narrative:    TOC assessing for ongoing needs and discharge planning.   Expected Discharge Plan: Home/Self Care Barriers to Discharge: Continued Medical Work up  Expected Discharge Plan and Services     Post Acute Care Choice: NA Living arrangements for the past 2 months: Single Family Home                                       Social Determinants of Health (SDOH) Interventions SDOH Screenings   Food Insecurity: No Food Insecurity (05/22/2023)  Housing: Low Risk  (05/22/2023)  Transportation Needs: No Transportation Needs (05/22/2023)  Utilities: Not At Risk (05/22/2023)  Depression (PHQ2-9): Low Risk  (03/28/2023)  Tobacco Use: Low Risk  (05/27/2023)   Received from [facility]    Readmission Risk Interventions    05/23/2023   10:56 AM 12/24/2022   11:39 AM 11/07/2022    4:05 PM  Readmission Risk Prevention Plan  Transportation Screening Complete Complete Complete  Medication Review (RN Care Manager) Complete Complete Complete  PCP or Specialist appointment within 3-5 days of discharge Complete    HRI or Home Care Consult  Complete   SW Recovery Care/Counseling Consult Complete    Palliative Care Screening Not Applicable    Skilled Nursing Facility Not Applicable Not Applicable Complete

## 2023-05-28 NOTE — Consult Note (Addendum)
[facility] Customer service manager [facility]) Accountable Care Organization (ACO) [facility] Liaison Note  05/28/2023  [name] 1941-09-10 [id number]  Location: [facility] RN Hospital Liaison met patient at bedside at [facility].  Insurance: [employer]   [name] is a 82 y.o. female who is a Primary Care Patient of Barbette Reichmann, MD [facility]). The patient was screened for  readmission hospitalization with noted extreme risk score for unplanned readmission risk with 3 IP in 6 months.  The patient was assessed for potential [facility]) Care Management service needs for post hospital transition for care coordination. Review of patient's electronic medical record reveals patient was admitted with Cellulitis (wound infection). Met with pt and her spouse at bedside. Educated on services and inquired on [facility] services who offers care management services through her H[employer] insurance as part of her benefits. Pt/spouse interested in [facility] services/referral and receptive to liaison contacting the agency with a provided contact number [phone] mobile. Hospital liaison referred to [facility] and spoke with Quintessa [phone] concerning this request. Spouse indicates he has several sons nearby to assist and pt receives delivery of all her medications. Spouse also inquired on [facility] for dressing changes. Liaison has collaborated with the [facility] social worker concerning this request. No other request or needs presented at this time to address. Patient was given an appointment reminder card.   Plan: [facility] Liaison will continue to follow progress and disposition to asess for post hospital community care coordination/management needs.  Referral request for community care coordination: pending disposition.   [facility] Care Management/Population Health does not replace or interfere with any arrangements made by the Inpatient Transition of Care  team.   For questions contact:   Elliot Cousin, RN, [name] Liaison [place]   Population Health Office Hours MTWF  8:00 am-6:00 pm Off on Thursday [phone] mobile [phone] [Office toll free line] Office Hours are M-F 8:30 - 5 pm 24 hour nurse advise line [phone] Concierge  [email or web address]

## 2023-05-28 NOTE — Consult Note (Signed)
Consultation Note Date: 05/28/2023   Patient Name: [name]  DOB: 1941/08/19  MRN: [id number]  Age / Sex: 82 y.o., female  PCP: Barbette Reichmann, MD Referring Physician: Marcelino Duster, MD  Reason for Consultation: Establishing goals of care  HPI/Patient Profile: [name] is a 82 y.o. 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  Patient reports these have been present since approximately January.  Patient follows up at wound care clinic.  Patient was seen for a routine evaluation today.  Patient's wounds were felt to be worsening and possibly infected started antibiotics, admitted to hospitalist service.   Podiatry team suggested MRI foot, vascular eval. Vascular  suggested CTA abd/ pelvis, not a candidate for angiogram. Her Hb she got a unit of blood. MRI pelvis, left foot showed osteomyelitis. Podiatry advised conservative management. Palliative care discussed with family regarding goals of care, she wants to be Full code. Continues to be in pain, has poor nutrition, poor quality of life.  Clinical Assessment and Goals of Care: Notes and labs reviewed.  Into see patient.  She is laying in [facility] bed, no family at bedside.  She states she lives at home with her husband of 60 years.  She states she has 2 children.  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  She states in January was when she was no longer able to walk and began seeing wound care clinic.    We discussed her diagnoses, prognosis, GOC, EOL wishes disposition and options.  Created space and opportunity for patient  to explore thoughts and feelings regarding current medical information.   A detailed discussion was had today regarding advanced directives.   Concepts specific to code status, artifical feeding and hydration, IV antibiotics and rehospitalization were discussed.  The difference between an aggressive medical intervention path and a comfort care path was discussed.  Values and goals of care important to patient and family were attempted to be elicited.  Discussed limitations of medical interventions to prolong quality of life in some situations and discussed the concept of human mortality.  She states that she would like to live longer to be there for her husband if possible.  She shares that she is a woman of faith and understands that ultimately God is in control.  She states she would never want to have a feeding tube placed, would never want to be put on life/ventilator support, and would never want to have CPR.  She states that she would like to speak with her husband privately prior to making formal changes to CODE STATUS.  SUMMARY OF RECOMMENDATIONS   PMT will follow-up tomorrow   Prognosis:  Poor      Primary Diagnoses: Present on Admission:  Cellulitis  Anemia of chronic disease  Protein-calorie malnutrition, severe (HCC)  Hypertension   I have reviewed the medical record, interviewed the patient and  family, and examined the patient. The following aspects are pertinent.  Past Medical History:  Diagnosis Date   Arthritis    Arthritis    Breast cancer (HCC) 1992   right breast with lumpectomy and rad tx   Cancer of right breast (HCC) 04/16/2013   right breast with mastectomy   Diabetes mellitus without complication (HCC)    Gout    High cholesterol    Hyperlipidemia    Hypertension    Personal history of radiation therapy    Social History   Socioeconomic History   Marital status: Married    Spouse name: Not on file   Number of children: 2   Years of education: Not on file   Highest education level: Not on file  Occupational History   Not on file  Tobacco Use   Smoking status: Never    Passive  exposure: Never   Smokeless tobacco: Never  Vaping Use   Vaping status: Never Used  Substance and Sexual Activity   Alcohol use: No    Alcohol/week: 0.0 standard drinks of alcohol   Drug use: No   Sexual activity: Not Currently  Other Topics Concern   Not on file  Social History Narrative   Not on file   Social Determinants of Health   Financial Resource Strain: Not on file  Food Insecurity: No Food Insecurity (05/22/2023)   Hunger Vital Sign    Worried About Running Out of Food in the Last Year: Never true    Ran Out of Food in the Last Year: Never true  Transportation Needs: No Transportation Needs (05/22/2023)   PRAPARE - Administrator, Civil Service (Medical): No    Lack of Transportation (Non-Medical): No  Physical Activity: Not on file  Stress: Not on file  Social Connections: Not on file   Family History  Problem Relation Age of Onset   Diabetes Father    Breast cancer Neg Hx    Scheduled Meds:  allopurinol  300 mg Oral Daily   vitamin C  500 mg Oral BID   aspirin EC  81 mg Oral Daily   atenolol  50 mg Oral Daily   atorvastatin  40 mg Oral Daily   Chlorhexidine Gluconate Cloth  6 each Topical Daily   collagenase   Topical Daily   cyanocobalamin  1,000 mcg Oral Daily   enoxaparin (LOVENOX) injection  40 mg Subcutaneous Q24H   feeding supplement  237 mL Oral TID BM   gabapentin  100 mg Oral Daily   insulin aspart  0-5 Units Subcutaneous QHS   insulin aspart  0-9 Units Subcutaneous TID WC   levothyroxine  88 mcg Oral Q0600   multivitamin with minerals  1 tablet Oral Daily   polyethylene glycol  17 g Oral Daily   thiamine (VITAMIN B1) injection  100 mg Intravenous Daily   zinc sulfate  220 mg Oral Daily   Continuous Infusions:  sodium chloride 75 mL/hr at 05/28/23 1148   sodium chloride Stopped (05/26/23 2129)   piperacillin-tazobactam (ZOSYN)  IV 3.375 g (05/28/23 0605)   PRN Meds:.sodium chloride, acetaminophen **OR** acetaminophen,  HYDROcodone-acetaminophen, morphine injection, polyethylene glycol Medications Prior to Admission:  Prior to Admission medications   Medication Sig Start Date End Date Taking? Authorizing Provider  acetaminophen (TYLENOL) 500 MG tablet Take 500-1,000 mg by mouth 4 (four) times daily as needed for mild pain or moderate pain.   Yes [provider]  allopurinol (ZYLOPRIM) 300 MG tablet Take 300  mg by mouth daily.   Yes [provider]  aspirin EC 81 MG tablet Take 1 tablet (81 mg total) by mouth daily. Swallow whole. 12/25/22  Yes Arnetha Courser, MD  atenolol (TENORMIN) 50 MG tablet Take 50 mg by mouth daily.   Yes [provider]  atorvastatin (LIPITOR) 40 MG tablet Take 40 mg by mouth daily.    Yes [provider]  diphenoxylate-atropine (LOMOTIL) 2.5-0.025 MG tablet Take 1-2 tablets by mouth 3 (three) times daily as needed. 08/20/22  Yes [provider]  furosemide (LASIX) 40 MG tablet Take 1 tablet (40 mg total) by mouth daily as needed for edema or fluid. 12/24/22  Yes Arnetha Courser, MD  gabapentin (NEURONTIN) 100 MG capsule TAKE 1 CAPSULE BY MOUTH ONCE DAILY FOR 7 DAYS THEN TAKE 1 CAPSULES BY MOUTH TWICE DAILY 02/21/23  Yes [provider]  gentamicin cream (GARAMYCIN) 0.1 % Apply 1 Application topically in the morning and at bedtime.   Yes [provider]  HYDROcodone-acetaminophen (NORCO) 7.5-325 MG tablet Take 1 tablet by mouth 2 (two) times daily as needed for moderate pain or severe pain.   Yes [provider]  levothyroxine (SYNTHROID) 88 MCG tablet Take 88 mcg by mouth daily.   Yes [provider]  lisinopril (ZESTRIL) 5 MG tablet Take 5 mg by mouth daily. 09/24/22 09/24/23 Yes [provider]  metFORMIN (GLUCOPHAGE) 1000 MG tablet Take 1,000 mg by mouth in the morning and at bedtime.   Yes [provider]  Multiple Vitamin (MULTIVITAMIN WITH MINERALS) TABS tablet Take 1 tablet by mouth daily.  01/12/23  Yes Lurene Shadow, MD  polyethylene glycol (MIRALAX / GLYCOLAX) 17 g packet Take 17 g by mouth daily. 12/25/22  Yes Arnetha Courser, MD  sitaGLIPtin (JANUVIA) 50 MG tablet Take 50 mg by mouth daily.   Yes [provider]  Vaginal Moisturizer (REPLENS EXTERNAL COMFORT) GEL Apply 1 application  topically daily. 04/30/23  Yes Vaillancourt, Samantha, PA-C  vitamin B-12 (CYANOCOBALAMIN) 1000 MCG tablet Take 1,000 mcg by mouth daily.   Yes [provider]   Allergies  Allergen Reactions   Other Anaphylaxis    Anesthisia has been a health issue for her in the past.    Sulfa Antibiotics Rash   Review of Systems  Musculoskeletal:        Wounds to hips and ankles and foot with dressings in place.    Physical Exam Pulmonary:     Effort: Pulmonary effort is normal.  Neurological:     Mental Status: She is alert.     Vital Signs: BP 133/72 (BP Location: Left Arm)   Pulse 73   Temp 98.6 F (37 C) (Oral)   Resp 16   Ht 4\' 11"  (1.499 m)   Wt 44.5 kg   SpO2 98%   BMI 19.79 kg/m  Pain Scale: 0-10 POSS *See Group Information*: 1-Acceptable,Awake and alert Pain Score: 1    SpO2: SpO2: 98 % O2 Device:SpO2: 98 % O2 Flow Rate: .   IO: Intake/output summary:  Intake/Output Summary (Last 24 hours) at 05/28/2023 1403 Last data filed at 05/28/2023 1156 Gross per 24 hour  Intake 1306.3 ml  Output 4470 ml  Net -3163.7 ml    LBM: Last BM Date : 05/27/23 Baseline Weight: Weight: 52.6 kg Most recent weight: Weight: 44.5 kg       Signed by: Morton Stall, NP   Please contact Palliative Medicine Team phone at [phone] for questions and concerns.  For  individual provider: See Loretha Stapler

## 2023-05-28 NOTE — Progress Notes (Signed)
Progress Note   Patient: [name] UJW:[id number] DOB: 09/09/1941 DOA: 05/22/2023     6 DOS: the patient was seen and examined on 05/28/2023   Brief hospital course: ESSIE LAGUNES is a 82 y.o. 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  Patient reports these have been present since approximately January.  Patient follows up at wound care clinic.  Patient was seen for a routine evaluation today.  Patient's wounds were felt to be worsening and possibly infected started antibiotics, admitted to hospitalist service.  Podiatry team suggested MRI foot, vascular eval. Vascular  suggested CTA abd/ pelvis, not a candidate for angiogram. Her Hb she got a unit of blood. MRI pelvis, left foot showed osteomyelitis. Podiatry advised conservative management. Palliative care discussed with family regarding goals of care, she wants to be Full code for now wishes to discuss with family regarding CODE STATUS. Continues to be in pain, has poor nutrition, poor quality of life. ID recommended 2 weeks of antibiotics.  Assessment and Plan: * Cellulitis- multiple open wounds on extremities. Osteomyelitis greater trochanter, left foot Pressure ulcers stage 3-4 ulcers over heels, sacrum, left hip, lower extremities. Patient is sent in from her wound clinic for her chronic wounds due to concern of infection. She does have osteomyelitis as evidenced by MRI. She is poor surgical candidate, podiatry advised conservative management.  ID advised 2 weeks of antibiotics. Vancomycin + zosyn while inpatient.  Her discharge planning challenging as she decline SNF and no HH can be set up yet by TOC. She need wound care, PT/ OT.   Anemia of chronic disease This is a chronic diagnosis.  Hb at 6.5 trended upto 8.4 s/p a unit of blood. Will change IV to oral iron supplements. Transfuse for Hb greater than 7.  Severe Malnutrition She has skin ulcers, BMI  19. Encourage oral diet, supplements. Nutrition evaluation appreciated. Palliative to continue goals of care discussion.  Hypotension Blood pressure better now. Monitor vitals.  Diabetes mellitus, type 2 (HCC) Continue accucheks, insulin sliding scale  Overall prognosis poor. I discussed with patient and her husband regarding goals of care advised DNR, comfort care only. She wishes to discuss with family. TOC working on discharge plan.    Subjective: Patient seen and examined today morning.  She is lying in bed, husband and palliative team at bedside. She complains of pain in the left foot. Wishes to get more pain meds. Eating poor.  Physical Exam: Vitals:   05/27/23 1647 05/27/23 1922 05/28/23 0528 05/28/23 0832  BP: 134/73 130/64 117/67 133/72  Pulse: 77 74 73 73  Resp: 18 16 18 16   Temp: 97.8 F (36.6 C) 98.2 F (36.8 C) 98.6 F (37 C) 98.6 F (37 C)  TempSrc: Oral Oral Oral Oral  SpO2: 95% 97% 98% 98%  Weight:      Height:       General - Elderly cachectic Caucasian female, in distress due to pain HEENT - PERRLA, EOMI, atraumatic head, non tender sinuses. Lung - Clear, rales, rhonchi, wheezes. Heart - S1, S2 heard, no murmurs, rubs, trace pedal edema Neuro -alert, awake, non focal exam. Skin - Warm and dry.  Multiple skin ulcers as noted above Data Reviewed:  CBC, blood sugars  Family Communication: Updated husband at bedside regarding current care, advised continued goals of care discussion with palliative  Disposition: Status is: Inpatient Remains inpatient appropriate because: multiple skin ulcers need IV antibiotics, goals of care, pain  control  Planned Discharge Destination: Patient wishes to go home. Dispo challenging.    Time spent: 44 minutes  Author: Marcelino Duster, MD 05/28/2023 3:13 PM  For on call review [email or web address].

## 2023-05-29 DIAGNOSIS — L8990 Pressure ulcer of unspecified site, unspecified stage: Secondary | ICD-10-CM

## 2023-05-29 DIAGNOSIS — Z7189 Other specified counseling: Secondary | ICD-10-CM | POA: Diagnosis not present

## 2023-05-29 LAB — GLUCOSE, CAPILLARY
Glucose-Capillary: 93 mg/dL (ref 70–99)
Glucose-Capillary: 101 mg/dL — ABNORMAL HIGH (ref 70–99)
Glucose-Capillary: 97 mg/dL (ref 70–99)
Glucose-Capillary: 181 mg/dL — ABNORMAL HIGH (ref 70–99)

## 2023-05-29 LAB — CREATININE, SERUM
Creatinine, Ser: 0.7 mg/dL (ref 0.44–1.00)
GFR, Estimated: >60 mL/min (ref >60)

## 2023-05-29 LAB — CK: Total CK: 43 U/L (ref 38–234)

## 2023-05-29 MED ORDER — ONDANSETRON HCL 4 MG/2ML IJ SOLN
4.0000 mg | Freq: Once | INTRAMUSCULAR | Status: AC
  Administered 2023-05-29: 4 mg via INTRAVENOUS
  Filled 2023-05-29: qty 2

## 2023-05-29 MED ORDER — THIAMINE MONONITRATE 100 MG PO TABS
100.0000 mg | ORAL_TABLET | Freq: Every day | ORAL | Status: DC
  Administered 2023-05-29 – 2023-05-31 (×3): 100 mg via ORAL
  Filled 2023-05-29 (×3): qty 1

## 2023-05-29 MED ORDER — ENOXAPARIN SODIUM 30 MG/0.3ML IJ SOSY
30.0000 mg | PREFILLED_SYRINGE | INTRAMUSCULAR | Status: DC
  Administered 2023-05-30 – 2023-05-31 (×2): 30 mg via SUBCUTANEOUS
  Filled 2023-05-29 (×2): qty 0.3

## 2023-05-29 MED ORDER — ONDANSETRON HCL 4 MG/2ML IJ SOLN
4.0000 mg | Freq: Four times a day (QID) | INTRAMUSCULAR | Status: DC | PRN
  Administered 2023-05-30 – 2023-05-31 (×2): 4 mg via INTRAVENOUS
  Filled 2023-05-29 (×2): qty 2

## 2023-05-29 NOTE — Progress Notes (Signed)
PHARMACIST - PHYSICIAN COMMUNICATION  DR:   Meriam Sprague  CONCERNING: IV to Oral Route Change Policy  RECOMMENDATION: This patient is receiving thiamine by the intravenous route.  Based on criteria approved by the Pharmacy and Therapeutics Committee, the intravenous medication(s) is/are being converted to the equivalent oral dose form(s).   DESCRIPTION: These criteria include: The patient is eating (either orally or via tube) and/or has been taking other orally administered medications for a least 24 hours The patient has no evidence of active gastrointestinal bleeding or impaired GI absorption (gastrectomy, short bowel, patient on TNA or NPO).  If you have questions about this conversion, please contact the Pharmacy Department  []   [phone] )  [facility] [x]   [phone] )  [facility] []   [phone] )  [facility] []   [phone] )  [facility] []   [phone] )  [facility]   Barrie Folk, [facility] 05/29/2023 8:42 AM

## 2023-05-29 NOTE — TOC Progression Note (Signed)
Transition of Care [facility]) - Progression Note    Patient Details  Name: [name] MRN: [id number] Date of Birth: [birth year]  Transition of [facility]) CM/SW Contact  Margarito Liner, LCSW Phone Number: 05/29/2023, 1:07 PM  Clinical Narrative:  [facility] is able to accept referral for PT, OT, RN, aide. They are aware of wound care needs and potential for IV abx at discharge. Patient and husband are aware.   Expected Discharge Plan: Home/Self Care Barriers to Discharge: Continued Medical Work up  Expected Discharge Plan and Services     Post Acute Care Choice: NA Living arrangements for the past 2 months: Single Family Home                                       Social Determinants of Health (SDOH) Interventions SDOH Screenings   Food Insecurity: No Food Insecurity (05/22/2023)  Housing: Low Risk  (05/22/2023)  Transportation Needs: No Transportation Needs (05/22/2023)  Utilities: Not At Risk (05/22/2023)  Depression (PHQ2-9): Low Risk  (03/28/2023)  Tobacco Use: Low Risk  (05/27/2023)   Received from [facility]    Readmission Risk Interventions    05/23/2023   10:56 AM 12/24/2022   11:39 AM 11/07/2022    4:05 PM  Readmission Risk Prevention Plan  Transportation Screening Complete Complete Complete  Medication Review (RN Care Manager) Complete Complete Complete  PCP or Specialist appointment within 3-5 days of discharge Complete    HRI or Home Care Consult  Complete   SW Recovery Care/Counseling Consult Complete    Palliative Care Screening Not Applicable    Skilled Nursing Facility Not Applicable Not Applicable Complete

## 2023-05-29 NOTE — Progress Notes (Signed)
PHARMACIST - PHYSICIAN COMMUNICATION  CONCERNING:  Enoxaparin (Lovenox) for DVT Prophylaxis    RECOMMENDATION: Patient was prescribed enoxaprin 40mg  q24 hours for VTE prophylaxis.   Filed Weights   05/22/23 1751 05/22/23 2243  Weight: 52.6 kg (115 lb 15.4 oz) 44.5 kg (98 lb)    Body mass index is 19.79 kg/m.  Estimated Creatinine Clearance: 38.3 mL/min (by C-G formula based on SCr of 0.7 mg/dL).  Patient is candidate for enoxaparin 30mg  every 24 hours based on CrCl <64ml/min or Weight <45kg  DESCRIPTION: Pharmacy has adjusted enoxaparin dose per [facility] policy.  Patient is now receiving enoxaparin 30 mg every 24 hours    Barrie Folk, PharmD Clinical Pharmacist  05/29/2023 8:34 AM

## 2023-05-29 NOTE — Progress Notes (Signed)
Progress Note   Patient: [name] QMV:[id number] DOB: [birth year] DOA: 05/22/2023     7 DOS: the patient was seen and examined on 05/29/2023    Subjective:  Patient seen and examined at bedside this morning in the presence of the husband Patient requiring IV antibiotics She tells me she is able to eat some Denies nausea vomiting chest pain or cough   Brief hospital course: [name] is a 82 y.o. 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  Patient reports these have been present since approximately January.  Patient follows up at wound care clinic.  Patient was seen for a routine evaluation today.  Patient's wounds were felt to be worsening and possibly infected started antibiotics, admitted to hospitalist service.   Podiatry team suggested MRI foot, vascular eval. Vascular  suggested CTA abd/ pelvis, not a candidate for angiogram. Her Hb she got a unit of blood. MRI pelvis, left foot showed osteomyelitis. Podiatry advised conservative management. Palliative care discussed with family regarding goals of care, she wants to be Full code for now wishes to discuss with family regarding CODE STATUS. Continues to be in pain, has poor nutrition, poor quality of life. ID recommended 2 weeks of antibiotics.   Assessment and Plan: * Cellulitis- multiple open wounds on extremities. Osteomyelitis greater trochanter, left foot Pressure ulcers - Patient with a right hip stage IV pressure ulcer, bilateral heel stage III pressure injuries, sacral unstageable pressure injury and left hip unstageable pressure injury All pressure ulcers listed above were present on admission Patient is sent in from her wound clinic for her chronic wounds due to concern of infection.  MRI report showing findings of osteomyelitis  Patient is a poor surgical candidate, podiatry advise conservative management Infectious disease on board and has  recommended IV antibiotics she is poor surgical candidate, podiatry advised conservative management.  Patient declining skilled nursing facility placement Transition of care, case managers on board and case discussed   Anemia of chronic disease This is a chronic diagnosis.  Patient is s/p blood transfusion Continue iron supplementation Continue to monitor CBC closely No indication for blood transfusion at this time   Severe Malnutrition She has skin ulcers, BMI 19. Dietitian evaluation appreciated Continue dietary supplementation Palliative care on board we appreciate input   Hypotension Blood pressure better now. Monitor vitals.   Diabetes mellitus, type 2 (HCC) Continue glucose monitoring Continue current insulin therapy   Overall prognosis poor. I discussed with patient and her husband regarding goals of care advised DNR, comfort care only. She wishes to discuss with family. TOC working on discharge plan.       Physical Exam: General -elderly female in bed cachectic HEENT -atraumatic normocephalic Lung - Clear, rales, rhonchi, wheezes. Heart - S1, S2 heard, no murmurs, rubs, bilateral lower extremity trace pitting edema Neuro -alert, awake, non focal exam. Skin - Warm and dry.  Multiple skin ulcers as noted above with dressing in place   Data Reviewed: Reviewed patient's laboratory results, palliative care documentation, progress notes as well as nursing documentation   CBC, blood sugars   Family Communication: I have discussed the goals of care with patient's husband present at bedside   Disposition: Status is: Inpatient Remains inpatient appropriate because: multiple skin ulcers need IV antibiotics, goals of care, pain control  Planned Discharge Destination: Patient wishes to go home. Dispo challenging.     Vitals:   05/28/23 2034 05/29/23 0341 05/29/23 0743 05/29/23  1549  BP: 126/61 (!) 145/71 129/73 108/61  Pulse: 72 81 67 78  Resp: 16 18 16 16   Temp:  98.1 F (36.7 C) 98.9 F (37.2 C) 98 F (36.7 C) 98.4 F (36.9 C)  TempSrc: Oral   Oral  SpO2: 95% 94% 93% 98%  Weight:      Height:            Author: Loyce Dys, MD 05/29/2023 4:33 PM  For on call review [email or web address].

## 2023-05-29 NOTE — Progress Notes (Signed)
Pt refuses dressing change at this time,  ID Md to assess wounds on rounds this morning, she wants to wait for dressings to be changed at that time.

## 2023-05-29 NOTE — Progress Notes (Signed)
Daily Progress Note   Patient Name: [name]       Date: 05/29/2023 DOB: 14-Jun-1941  Age: 82 y.o. MRN#: [id number] Attending Physician: Loyce Dys, MD Primary Care Physician: Barbette Reichmann, MD Admit Date: 05/22/2023  Reason for Consultation/Follow-up: Establishing goals of care  Subjective: Notes and labs reviewed.  Into see patient.  Patient is resting in bed with husband at bedside.  Patient states she is okay with my speaking with her husband.  Husband states they owned an assisted living facility and just recently turned in their license this year.  He began discussing plans for home health and antibiotics; patient requests to talk further tomorrow.    Length of Stay: 7  Current Medications: Scheduled Meds:   allopurinol  300 mg Oral Daily   vitamin C  500 mg Oral BID   aspirin EC  81 mg Oral Daily   atenolol  50 mg Oral Daily   atorvastatin  40 mg Oral Daily   Chlorhexidine Gluconate Cloth  6 each Topical Daily   collagenase   Topical Daily   cyanocobalamin  1,000 mcg Oral Daily   [START ON 05/30/2023] enoxaparin (LOVENOX) injection  30 mg Subcutaneous Q24H   feeding supplement  237 mL Oral TID BM   ferrous sulfate  325 mg Oral BID WC   gabapentin  100 mg Oral Daily   insulin aspart  0-5 Units Subcutaneous QHS   insulin aspart  0-9 Units Subcutaneous TID WC   levothyroxine  88 mcg Oral Q0600   multivitamin with minerals  1 tablet Oral Daily   polyethylene glycol  17 g Oral Daily   thiamine  100 mg Oral Daily   zinc sulfate  220 mg Oral Daily    Continuous Infusions:  sodium chloride 75 mL/hr at 05/29/23 0126   sodium chloride Stopped (05/26/23 2129)   DAPTOmycin (CUBICIN) 350 mg in sodium chloride 0.9 % IVPB 350 mg (05/28/23 1916)    piperacillin-tazobactam (ZOSYN)  IV 3.375 g (05/29/23 0615)    PRN Meds: sodium chloride, acetaminophen **OR** acetaminophen, HYDROcodone-acetaminophen, morphine injection, polyethylene glycol  Physical Exam Pulmonary:     Effort: Pulmonary effort is normal.  Neurological:     Mental Status: She is alert.             Vital Signs: BP  129/73 (BP Location: Left Arm)   Pulse 67   Temp 98 F (36.7 C)   Resp 16   Ht 4\' 11"  (1.499 m)   Wt 44.5 kg   SpO2 93%   BMI 19.79 kg/m  SpO2: SpO2: 93 % O2 Device: O2 Device: Room Air O2 Flow Rate:    Intake/output summary:  Intake/Output Summary (Last 24 hours) at 05/29/2023 1444 Last data filed at 05/29/2023 0600 Gross per 24 hour  Intake 880.6 ml  Output 1125 ml  Net -244.4 ml   LBM: Last BM Date : 05/27/23 Baseline Weight: Weight: 52.6 kg Most recent weight: Weight: 44.5 kg   Patient Active Problem List   Diagnosis Date Noted   Right foot ulcer, with fat layer exposed (HCC) 05/23/2023   Cellulitis 05/22/2023   General weakness 01/31/2023   Osteomyelitis of right hip (HCC) 01/13/2023   Hypoalbuminemia 01/10/2023   Decubitus ulcer of trochanter, right, stage III (HCC) 01/06/2023   Wound infection 01/05/2023   Severe sepsis (HCC) 01/05/2023   Anemia of chronic disease 01/05/2023   Fever 12/21/2022   Leukocytosis 12/20/2022   NSTEMI (non-ST elevated myocardial infarction) (HCC) 12/19/2022   Lower urinary tract infectious disease 12/19/2022   Type II diabetes mellitus with renal manifestations (HCC) 12/19/2022   CKD (chronic kidney disease) stage 2, GFR 60-89 ml/min 12/19/2022   DVT (deep venous thrombosis) (HCC) 12/19/2022   Ulcer of both feet, limited to breakdown of skin (HCC) 12/19/2022   Reactive thrombocytosis 11/04/2022   Acute deep vein thrombosis (DVT) of femoral vein of left lower extremity (HCC) 11/02/2022   Chronic ulcer of great toe of left foot with fat layer exposed (HCC) 11/02/2022   Anemia due to stage 3a  chronic kidney disease (HCC) 11/02/2022   Acute on chronic urinary retention 11/02/2022   Thrombus 11/02/2022   Pressure ulcers of skin of multiple topographic sites 08/23/2022   Acute metabolic encephalopathy 08/22/2022   Sepsis (HCC) 08/22/2022   Acute diverticulitis 08/22/2022   Hydroureteronephrosis 08/22/2022   Hypokalemia 08/22/2022   Protein-calorie malnutrition, severe (HCC) 08/18/2022   Cellulitis of lower extremity 08/17/2022   Cellulitis of both lower extremities 08/16/2022   Diabetes mellitus without complication (HCC)    Gout    Chronic diastolic CHF (congestive heart failure) (HCC)    Iron deficiency anemia    Diarrhea    Cervical spondylosis 03/09/2020   Lower limb ulcer, ankle, right, limited to breakdown of skin (HCC) 09/09/2018   Chronic gouty arthritis 04/29/2018   Generalized osteoarthritis of hand 02/12/2018   Hyperuricemia 02/12/2018   Swelling of finger 02/12/2018   PAD (peripheral artery disease) (HCC) 04/30/2017   Lymphedema 04/30/2017   Bilateral edema of lower extremity 04/03/2017   Ecchymosis 04/03/2017   Leg pain, bilateral 04/03/2017   Acquired hypothyroidism 01/24/2017   Benign essential hypertension 01/24/2017   History of mastectomy, right 01/24/2017   Primary osteoarthritis of left knee 02/29/2016   Cancer of right breast (HCC) 04/17/2015   Chronic pain 03/03/2015   Chronic pain of both knees 03/03/2015   Arthritis 01/11/2014   Soft tissue lesion of shoulder region 01/11/2014   Diabetes mellitus, type 2 (HCC) 01/11/2014   H/O renal calculi 01/11/2014   Hypertension 01/11/2014   HLD (hyperlipidemia) 01/11/2014   Neuropathy 01/11/2014   OP (osteoporosis) 01/11/2014   H/O malignant neoplasm of breast 01/11/2014    Palliative Care Assessment & Plan    Recommendations/Plan:  PMT following   Code Status:    Code Status Orders  (  From admission, onward)           Start     Ordered   05/22/23 1923  Full code  Continuous        Question:  By:  Answer:  Consent: discussion documented in EHR   05/22/23 1924           Code Status History     Date Active Date Inactive Code Status Order ID Comments User Context   01/08/2023 1035 01/15/2023 2205 DNR [id number]  Canary Brim, NP Inpatient   01/05/2023 1657 01/08/2023 1034 Full Code [id number]  Cox, Amy Dorris Carnes, DO ED   12/19/2022 0741 12/24/2022 2305 Full Code [id number]  Lorretta Harp, MD ED   11/02/2022 0143 11/09/2022 1707 Full Code [id number]  Andris Baumann, MD ED   08/22/2022 2346 08/25/2022 2014 Full Code [id number]  Lurene Shadow, MD ED   08/16/2022 1701 08/18/2022 2022 Full Code [id number]  Lorretta Harp, MD ED      Advance Directive Documentation    Flowsheet Row Most Recent Value  Type of Advance Directive Living will  Pre-existing out of facility DNR order (yellow form or pink MOST form) --  "MOST" Form in Place? --       Thank you for allowing the Palliative Medicine Team to assist in the care of this patient.   Morton Stall, NP  Please contact Palliative Medicine Team phone at [phone] for questions and concerns.

## 2023-05-29 NOTE — Progress Notes (Signed)
Occupational Therapy Treatment Patient Details Name: [name] MRN: [id number] DOB: Dec 03, 1940 Today's Date: 05/29/2023   History of present illness AHNIYAH GIANCOLA is a 82 y.o. 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  Patient reports these have been present since approximately January.  Patient follows up at wound care clinic.  Patient was seen for a routine evaluation today.  Patient's wounds were felt to be worsening and possibly infected.  Therefore patient is sent to the ER today.   OT comments  Pt seen for OT treatment on this date. Upon arrival to room pt beginning to eat lunch, husband present, agreeable to OT tx session. OT facilitated ADL management as described below. See ADL section for additional details regarding occupational performance. Requires MOD A for bed mobility this date to attempt sitting EOB and roll onto bedpan for BM. Pt will continue to benefit from continued mobility such as position changing, getting up to recliner as able for pressure relief. Pt is at her baseline for ADL performance as she requires assist from husband for all ADL tasks. OT to sign off at this time as pt has no skilled needs. Pt would benefit from home evaluation for further pressure relief management such as hospital bed mattress and w/c cushion. OT will complete orders. NT aware of pt's position on bedpan, call bell within reach.    Recommendations for follow up therapy are one component of a multi-disciplinary discharge planning process, led by the attending physician.  Recommendations may be updated based on patient status, additional functional criteria and insurance authorization.    Assistance Recommended at Discharge Frequent or constant Supervision/Assistance  Patient can return home with the following  A lot of help with walking and/or transfers;Assistance with cooking/housework;Assist for transportation;Help with  stairs or ramp for entrance;A lot of help with bathing/dressing/bathroom;Direct supervision/assist for medications management         Precautions / Restrictions Precautions Precautions: Fall Precaution Comments: Multiple wounds       Mobility Bed Mobility Overal bed mobility: Needs Assistance Bed Mobility: Rolling, Sidelying to Sit Rolling: Mod assist Sidelying to sit: Max assist       General bed mobility comments: Increased time, movements effortful, pt needing frequent rest breaks    Transfers                         Balance                                           ADL either performed or assessed with clinical judgement   ADL Overall ADL's : Needs assistance/impaired Eating/Feeding: Set up                           Toileting- Clothing Manipulation and Hygiene: Minimal assistance Toileting - Clothing Manipulation Details (indicate cue type and reason): Pt verbalizing need for BM; MAX A to roll onto bed pan       General ADL Comments: Attempted to sit pt EOB to eat lunch; pt begins moving BLE to hang off bed, then verbalizes need for BM.      Cognition Arousal/Alertness: Awake/alert Behavior During Therapy: WFL for tasks assessed/performed Overall Cognitive Status: Within Functional Limits for tasks assessed  Pertinent Vitals/ Pain       Pain Assessment Pain Assessment: 0-10 Pain Score: 7    Frequency  Other (comment) (OT to sign off)        Progress Toward Goals  OT Goals(current goals can now be found in the care plan section)  Progress towards OT goals: Goals met/education completed, patient discharged from OT (Pt requires assist at baseline - education and skilled OT needs met at this time.)  Acute Rehab OT Goals Time For Goal Achievement: 06/06/23 Potential to Achieve Goals: Fair ADL Goals Pt Will Perform Grooming: with  set-up;with min guard assist;sitting Additional ADL Goal #1: Pt will complete rolling bed mobility requiring MOD A for bed level ADL and pressure relief, 3/3 opportunities.  Plan Frequency needs to be updated (Pt is at functional baseline. OT to sign off.)       AM-PAC OT "6 Clicks" Daily Activity     Outcome Measure   Help from another person eating meals?: A Little Help from another person taking care of personal grooming?: A Little Help from another person toileting, which includes using toliet, bedpan, or urinal?: Total Help from another person bathing (including washing, rinsing, drying)?: A Lot Help from another person to put on and taking off regular upper body clothing?: A Lot Help from another person to put on and taking off regular lower body clothing?: Total 6 Click Score: 12    End of Session    OT Visit Diagnosis: Other abnormalities of gait and mobility (R26.89);Muscle weakness (generalized) (M62.81);Pain   Activity Tolerance Other (comment) (tx limited by need for BM)   Patient Left in bed;with call bell/phone within reach;with bed alarm set   Nurse Communication Other (comment) (Pt position on bed pan)        Time: 4098-1191 OT Time Calculation (min): 17 min  Charges: OT General Charges $OT Visit: 1 Visit OT Treatments $Self Care/Home Management : 8-22 mins  Gustavus Haskin L. Random Dobrowski, OTR/L  05/29/23, 2:39 PM

## 2023-05-30 ENCOUNTER — Ambulatory Visit: Payer: PPO | Admitting: Urology

## 2023-05-30 DIAGNOSIS — Z7189 Other specified counseling: Secondary | ICD-10-CM | POA: Diagnosis not present

## 2023-05-30 DIAGNOSIS — S81802D Unspecified open wound, left lower leg, subsequent encounter: Secondary | ICD-10-CM

## 2023-05-30 DIAGNOSIS — L89629 Pressure ulcer of left heel, unspecified stage: Secondary | ICD-10-CM | POA: Diagnosis not present

## 2023-05-30 DIAGNOSIS — L89229 Pressure ulcer of left hip, unspecified stage: Secondary | ICD-10-CM | POA: Diagnosis not present

## 2023-05-30 DIAGNOSIS — S81801A Unspecified open wound, right lower leg, initial encounter: Secondary | ICD-10-CM

## 2023-05-30 DIAGNOSIS — L8990 Pressure ulcer of unspecified site, unspecified stage: Secondary | ICD-10-CM | POA: Diagnosis not present

## 2023-05-30 DIAGNOSIS — L89219 Pressure ulcer of right hip, unspecified stage: Secondary | ICD-10-CM | POA: Diagnosis not present

## 2023-05-30 DIAGNOSIS — L89619 Pressure ulcer of right heel, unspecified stage: Secondary | ICD-10-CM | POA: Diagnosis not present

## 2023-05-30 LAB — CBC WITH DIFFERENTIAL/PLATELET
Abs Immature Granulocytes: 0.15 10*3/uL — ABNORMAL HIGH (ref 0.00–0.07)
Basophils Absolute: 0.1 10*3/uL (ref 0.0–0.1)
Basophils Relative: 1 %
Eosinophils Absolute: 0.2 10*3/uL (ref 0.0–0.5)
Eosinophils Relative: 2 %
HCT: 26 % — ABNORMAL LOW (ref 36.0–46.0)
Hemoglobin: 8.1 g/dL — ABNORMAL LOW (ref 12.0–15.0)
Immature Granulocytes: 2 %
Lymphocytes Relative: 25 %
Lymphs Abs: 2.6 10*3/uL (ref 0.7–4.0)
MCH: 27.1 pg (ref 26.0–34.0)
MCHC: 31.2 g/dL (ref 30.0–36.0)
MCV: 87 fL (ref 80.0–100.0)
Monocytes Absolute: 0.6 10*3/uL (ref 0.1–1.0)
Monocytes Relative: 6 %
Neutro Abs: 6.6 10*3/uL (ref 1.7–7.7)
Neutrophils Relative %: 64 %
Platelets: 506 10*3/uL — ABNORMAL HIGH (ref 150–400)
RBC: 2.99 MIL/uL — ABNORMAL LOW (ref 3.87–5.11)
RDW: 19.1 % — ABNORMAL HIGH (ref 11.5–15.5)
WBC: 10.3 10*3/uL (ref 4.0–10.5)
nRBC: 0.2 % (ref 0.0–0.2)

## 2023-05-30 LAB — GLUCOSE, CAPILLARY
Glucose-Capillary: 98 mg/dL (ref 70–99)
Glucose-Capillary: 140 mg/dL — ABNORMAL HIGH (ref 70–99)
Glucose-Capillary: 137 mg/dL — ABNORMAL HIGH (ref 70–99)
Glucose-Capillary: 118 mg/dL — ABNORMAL HIGH (ref 70–99)

## 2023-05-30 LAB — BASIC METABOLIC PANEL
Anion gap: 6 (ref 5–15)
BUN: 19 mg/dL (ref 8–23)
CO2: 23 mmol/L (ref 22–32)
Calcium: 7.8 mg/dL — ABNORMAL LOW (ref 8.9–10.3)
Chloride: 108 mmol/L (ref 98–111)
Creatinine, Ser: 0.7 mg/dL (ref 0.44–1.00)
GFR, Estimated: >60 mL/min (ref >60)
Glucose, Bld: 98 mg/dL (ref 70–99)
Potassium: 3.6 mmol/L (ref 3.5–5.1)
Sodium: 137 mmol/L (ref 135–145)

## 2023-05-30 NOTE — Progress Notes (Signed)
Progress Note   Patient: [name] JXB:[id number] DOB: 09-21-1941 DOA: 05/22/2023     8 DOS: the patient was seen and examined on 05/30/2023      Subjective:  Patient seen and examined at bedside this morning Denies any acute overnight complaints Denies nausea vomiting abdominal pain or chest pain   Brief hospital course: [name] is a 82 y.o. 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  Patient reports these have been present since approximately January.  Patient follows up at wound care clinic.  Patient was seen for a routine evaluation today.  Patient's wounds were felt to be worsening and possibly infected started antibiotics, admitted to hospitalist service.   Podiatry team suggested MRI foot, vascular eval. Vascular  suggested CTA abd/ pelvis, not a candidate for angiogram. Her Hb she got a unit of blood. MRI pelvis, left foot showed osteomyelitis. Podiatry advised conservative management. Palliative care discussed with family regarding goals of care, she wants to be Full code for now wishes to discuss with family regarding CODE STATUS. Continues to be in pain, has poor nutrition, poor quality of life. ID recommended 2 weeks of antibiotics.   Assessment and Plan: * Cellulitis- multiple open wounds on extremities. Osteomyelitis greater trochanter, left foot Pressure ulcers - Patient with a right hip stage IV pressure ulcer, bilateral heel stage III pressure injuries, sacral unstageable pressure injury and left hip unstageable pressure injury All pressure ulcers listed above were present on admission Patient is sent in from her wound clinic for her chronic wounds due to concern of infection.  MRI report showing findings of osteomyelitis  Wound culture results showing few Staphylococcus choroidals, rare Pseudomonas aeruginosa sensitivity pending We will continue to wait on final infectious disease  recommendation Continue current IV antibiotics Have discussed the case with infectious disease today Case discussed with transition of care manager Patient is a poor surgical candidate, podiatry advise conservative management Infectious disease on board she is poor surgical candidate, podiatry advised conservative management.  Patient declining skilled nursing facility placement    Anemia of chronic disease This is a chronic diagnosis.  Patient is s/p blood transfusion Continue iron supplementation Continue to monitor CBC closely No indication for blood transfusion at this time   Severe Malnutrition She has skin ulcers, BMI 19. Dietitian evaluation appreciated Continue dietary supplementation Palliative care on board we appreciate input   Hypotension Blood pressure better now. Monitor vitals.   Diabetes mellitus, type 2 (HCC) Continue glucose monitoring Continue current insulin therapy   Goals of care discussed with patient's husband as well as the patient Prognosis continues to be poor Palliative care on board         Physical Exam: General -elderly female lying in bed cachectic HEENT -atraumatic normocephalic Lung - Clear, rales, rhonchi, wheezes. Heart - S1, S2 heard, no murmur Neuro -alert, awake, non focal exam. Skin - Warm and dry.  Multiple skin ulcers as noted above with dressing in place     Data Reviewed: I have reviewed patient's CBC, BMP results   CBC, blood sugars   Family Communication: I have discussed the goals of care with patient's husband present at bedside   Disposition: Status is: Inpatient Still continues to meet inpatient criteria as patient is needing IV antibiotics and consultants infectious disease input   Planned Discharge Destination: Patient wishes to go home. Dispo challenging.    Vitals:   05/29/23 1549 05/29/23 1952 05/30/23 0432 05/30/23  0850  BP: 108/61 (!) 112/56 129/67 118/69  Pulse: 78 90 77 70  Resp: 16 18 20 18    Temp: 98.4 F (36.9 C) 98.6 F (37 C) 98.4 F (36.9 C) 98.3 F (36.8 C)  TempSrc: Oral   Oral  SpO2: 98% 91% 92% 91%  Weight:      Height:          Author: Loyce Dys, MD 05/30/2023 3:46 PM  For on call review [email or web address].

## 2023-05-30 NOTE — Care Management Important Message (Signed)
Important Message  Patient Details  Name: [name] MRN: [id number] Date of Birth: 21-Jun-1941   Medicare Important Message Given:  Yes     [name] 05/30/2023, 2:21 PM

## 2023-05-30 NOTE — Progress Notes (Signed)
Date of Admission:  05/22/2023      ID: [name] is a 82 y.o. female with   Principal Problem:   Cellulitis Active Problems:   Diabetes mellitus, type 2 (HCC)   Hypertension   Protein-calorie malnutrition, severe (HCC)   Anemia of chronic disease   Right foot ulcer, with fat layer exposed (HCC)    Subjective: Pt has no specific complaint  Medications:   allopurinol  300 mg Oral Daily   vitamin C  500 mg Oral BID   aspirin EC  81 mg Oral Daily   atenolol  50 mg Oral Daily   atorvastatin  40 mg Oral Daily   Chlorhexidine Gluconate Cloth  6 each Topical Daily   collagenase   Topical Daily   cyanocobalamin  1,000 mcg Oral Daily   enoxaparin (LOVENOX) injection  30 mg Subcutaneous Q24H   feeding supplement  237 mL Oral TID BM   ferrous sulfate  325 mg Oral BID WC   gabapentin  100 mg Oral Daily   insulin aspart  0-5 Units Subcutaneous QHS   insulin aspart  0-9 Units Subcutaneous TID WC   levothyroxine  88 mcg Oral Q0600   multivitamin with minerals  1 tablet Oral Daily   polyethylene glycol  17 g Oral Daily   thiamine  100 mg Oral Daily   zinc sulfate  220 mg Oral Daily    Objective: Vital signs in last 24 hours: Patient Vitals for the past 24 hrs:  BP Temp Temp src Pulse Resp SpO2  05/30/23 1947 131/69 -- -- 88 17 93 %  05/30/23 1551 130/61 (!) 97.5 F (36.4 C) -- 76 16 95 %  05/30/23 0850 118/69 98.3 F (36.8 C) Oral 70 18 91 %  05/30/23 0432 129/67 98.4 F (36.9 C) -- 77 20 92 %      PHYSICAL EXAM:  General: Alert, cooperative,pale, chronically ill Lungs: b/l air entry. Heart: irregular. Abdomen: Soft,  Extremities: rt hip greater trochanter area stage 3 decub- healthy granulation tissue  Rt leg- above ankle superficial wound Heel decub    Left leg superficial wound- bleeds easily- no purulence Small heel decub     Skin: No rashes or lesions. Or bruising Lymph: Cervical, supraclavicular normal. Neurologic: Grossly non-focal  Lab  Results    Latest Ref Rng & Units 05/30/2023    5:27 AM 05/27/2023    5:19 AM 05/25/2023    6:01 AM  CBC  WBC 4.0 - 10.5 K/uL 10.3  10.1  11.3   Hemoglobin 12.0 - 15.0 g/dL 8.1  8.4  8.2   Hematocrit 36.0 - 46.0 % 26.0  28.0  26.7   Platelets 150 - 400 K/uL 506  511  548        Latest Ref Rng & Units 05/30/2023    5:27 AM 05/29/2023    5:42 AM 05/26/2023    4:35 AM  CMP  Glucose 70 - 99 mg/dL 98     BUN 8 - 23 mg/dL 19     Creatinine 1.09 - 1.00 mg/dL 3.23  5.57  3.22   Sodium 135 - 145 mmol/L 137     Potassium 3.5 - 5.1 mmol/L 3.6     Chloride 98 - 111 mmol/L 108     CO2 22 - 32 mmol/L 23     Calcium 8.9 - 10.3 mg/dL 7.8         Microbiology: [facility] NG Wc rt hip staph aureus Rt leg culture  staph aureus Left leg culture staph aureus and pseudomonas    Assessment/Plan:  Multiple pressure wounds Rt hip Both heels  Superficial wounds legs after skin tear All these wounds though clinically do not look infected has staph aureus and hence will need antibiotics fr 2-4 weeks Await susceptibility to decide on route and duration of antibiotic She has anemia, hypoalbuminemia and poor reserve that makes healing difficult Also she is not ambulatory Day 8 of IV antibiotic  History of CAD   H/o Ca breast  post right mastectomy   History of extensive iliofemoral DVT of the left side status post mechanical thrombectomy and IVC placement in November 06, 2022  Discussed the management with the patient and hospitalist

## 2023-05-30 NOTE — Progress Notes (Signed)
Nutrition Follow-up  DOCUMENTATION CODES:   Severe malnutrition in context of chronic illness  INTERVENTION:   Ensure Enlive po TID, each supplement provides 350 kcal and 20 grams of protein.  MVI po daily   Vitamin C 500mg  po BID  Zinc 220mg  po daily x 14 days  Pt at refeed risk; recommend monitor potassium, magnesium and phosphorus labs daily until stable  NUTRITION DIAGNOSIS:   Severe Malnutrition related to chronic illness (DM) as evidenced by moderate muscle depletion, severe muscle depletion, moderate fat depletion, severe fat depletion, percent weight loss. -ongoing   GOAL:   Patient will meet greater than or equal to 90% of their needs -progressing   MONITOR:   PO intake, Supplement acceptance, Labs, Weight trends, I & O's, Skin  ASSESSMENT:   82 y/o female with h/o DM, HTN, HLD, breast cancer s/p mastectomy, PAD, lymphedema, gout, CHF, IDA, CKD III, NSTEMI, DVT and chronic non healing ulcers to B/L heels and LT hip who is admitted with osteomyelitis and cellulitis.  Pt continues to have fair appetite and oral intake in hospital; pt eating 50-75% of meals. RN reports that patient continues to drink her Ensure supplements. Vitamins returned wnl. No refeed labs checked recently; will check tomorrow morning. No new weight since admission; RD will request daily weights as pt continues on IVF. Palliative care following.     Medications reviewed and include: allopurinol, vitamin C, aspirin, B12, lovenox, ferrous sulfate, insulin, synthroid, miralax, thiamine, zinc, NaCl @75ml /hr, daptomycin, zosyn   Labs reviewed: K 3.6 wnl Vitamin A 38.1, vitamin E 12.4 wnl- 7/11 Hgb 8.1(L), Hct 26.0(L) Cbgs- 140, 98 x 24 hrs   Diet Order:   Diet Order             Diet regular Room service appropriate? Yes; Fluid consistency: Thin  Diet effective now                  EDUCATION NEEDS:   Education needs have been addressed  Skin:    Sacral Unstageable pressure injury  and left hip Unstageable pressure injury  Right medial LE full thickness wound and bilateral heel Stage 3 pressure injuries:  Right hip Stage 4 pressure injury:  Bilateral LEs  with partial thickness skin loss:     Last BM:  7/17- type 6  Height:   Ht Readings from Last 1 Encounters:  05/22/23 4\' 11"  (1.499 m)    Weight:   Wt Readings from Last 1 Encounters:  05/22/23 44.5 kg    Ideal Body Weight:  44.7 kg  BMI:  Body mass index is 19.79 kg/m.  Estimated Nutritional Needs:   Kcal:  1400-1600kcal/day  Protein:  70-80g/day  Fluid:  1.2-1.4L/day  Betsey Holiday MS, RD, LDN Please refer to [facility] for RD and/or RD on-call/weekend/after hours pager

## 2023-05-30 NOTE — Progress Notes (Signed)
Daily Progress Note   Patient Name: [name]       Date: 05/30/2023 DOB: 1941/03/03  Age: 82 y.o. MRN#: [id number] Attending Physician: Loyce Dys, MD Primary Care Physician: Barbette Reichmann, MD Admit Date: 05/22/2023  Reason for Consultation/Follow-up: Establishing goals of care  Subjective: Notes and labs reviewed.  Into see patient.  No family at bedside.  Patient states the current plan is to go home if she can go without IV antibiotics.  Return to her previous conversation regarding CODE STATUS and limits to care.  She states that she speaks with her husband about things, but makes decisions for herself and does not want [place] to discuss with him.  She states with thought, she believes she would never want CPR or other resuscitative efforts and would not want to be placed on the ventilator ;  a DNR/DNI status.  She would like to wait until tomorrow to make the changes.   Length of Stay: 8  Current Medications: Scheduled Meds:   allopurinol  300 mg Oral Daily   vitamin C  500 mg Oral BID   aspirin EC  81 mg Oral Daily   atenolol  50 mg Oral Daily   atorvastatin  40 mg Oral Daily   Chlorhexidine Gluconate Cloth  6 each Topical Daily   collagenase   Topical Daily   cyanocobalamin  1,000 mcg Oral Daily   enoxaparin (LOVENOX) injection  30 mg Subcutaneous Q24H   feeding supplement  237 mL Oral TID BM   ferrous sulfate  325 mg Oral BID WC   gabapentin  100 mg Oral Daily   insulin aspart  0-5 Units Subcutaneous QHS   insulin aspart  0-9 Units Subcutaneous TID WC   levothyroxine  88 mcg Oral Q0600   multivitamin with minerals  1 tablet Oral Daily   polyethylene glycol  17 g Oral Daily   thiamine  100 mg Oral Daily   zinc sulfate  220 mg Oral Daily    Continuous  Infusions:  sodium chloride 75 mL/hr at 05/30/23 0500   sodium chloride Stopped (05/26/23 2129)   DAPTOmycin (CUBICIN) 350 mg in sodium chloride 0.9 % IVPB 350 mg (05/29/23 1450)   piperacillin-tazobactam (ZOSYN)  IV 3.375 g (05/30/23 0558)    PRN Meds: sodium chloride, acetaminophen **OR** acetaminophen, HYDROcodone-acetaminophen,  morphine injection, ondansetron (ZOFRAN) IV, polyethylene glycol  Physical Exam Pulmonary:     Effort: Pulmonary effort is normal.  Neurological:     Mental Status: She is alert.             Vital Signs: BP 118/69 (BP Location: Left Arm)   Pulse 70   Temp 98.3 F (36.8 C) (Oral)   Resp 18   Ht 4\' 11"  (1.499 m)   Wt 44.5 kg   SpO2 91%   BMI 19.79 kg/m  SpO2: SpO2: 91 % O2 Device: O2 Device: Room Air O2 Flow Rate:    Intake/output summary:  Intake/Output Summary (Last 24 hours) at 05/30/2023 1336 Last data filed at 05/30/2023 1101 Gross per 24 hour  Intake 1486.38 ml  Output 1900 ml  Net -413.62 ml   LBM: Last BM Date : 05/29/23 Baseline Weight: Weight: 52.6 kg Most recent weight: Weight: 44.5 kg    Patient Active Problem List   Diagnosis Date Noted   Right foot ulcer, with fat layer exposed (HCC) 05/23/2023   Cellulitis 05/22/2023   General weakness 01/31/2023   Osteomyelitis of right hip (HCC) 01/13/2023   Hypoalbuminemia 01/10/2023   Decubitus ulcer of trochanter, right, stage III (HCC) 01/06/2023   Wound infection 01/05/2023   Severe sepsis (HCC) 01/05/2023   Anemia of chronic disease 01/05/2023   Fever 12/21/2022   Leukocytosis 12/20/2022   NSTEMI (non-ST elevated myocardial infarction) (HCC) 12/19/2022   Lower urinary tract infectious disease 12/19/2022   Type II diabetes mellitus with renal manifestations (HCC) 12/19/2022   CKD (chronic kidney disease) stage 2, GFR 60-89 ml/min 12/19/2022   DVT (deep venous thrombosis) (HCC) 12/19/2022   Ulcer of both feet, limited to breakdown of skin (HCC) 12/19/2022   Reactive  thrombocytosis 11/04/2022   Acute deep vein thrombosis (DVT) of femoral vein of left lower extremity (HCC) 11/02/2022   Chronic ulcer of great toe of left foot with fat layer exposed (HCC) 11/02/2022   Anemia due to stage 3a chronic kidney disease (HCC) 11/02/2022   Acute on chronic urinary retention 11/02/2022   Thrombus 11/02/2022   Pressure ulcers of skin of multiple topographic sites 08/23/2022   Acute metabolic encephalopathy 08/22/2022   Sepsis (HCC) 08/22/2022   Acute diverticulitis 08/22/2022   Hydroureteronephrosis 08/22/2022   Hypokalemia 08/22/2022   Protein-calorie malnutrition, severe (HCC) 08/18/2022   Cellulitis of lower extremity 08/17/2022   Cellulitis of both lower extremities 08/16/2022   Diabetes mellitus without complication (HCC)    Gout    Chronic diastolic CHF (congestive heart failure) (HCC)    Iron deficiency anemia    Diarrhea    Cervical spondylosis 03/09/2020   Lower limb ulcer, ankle, right, limited to breakdown of skin (HCC) 09/09/2018   Chronic gouty arthritis 04/29/2018   Generalized osteoarthritis of hand 02/12/2018   Hyperuricemia 02/12/2018   Swelling of finger 02/12/2018   PAD (peripheral artery disease) (HCC) 04/30/2017   Lymphedema 04/30/2017   Bilateral edema of lower extremity 04/03/2017   Ecchymosis 04/03/2017   Leg pain, bilateral 04/03/2017   Acquired hypothyroidism 01/24/2017   Benign essential hypertension 01/24/2017   History of mastectomy, right 01/24/2017   Primary osteoarthritis of left knee 02/29/2016   Cancer of right breast (HCC) 04/17/2015   Chronic pain 03/03/2015   Chronic pain of both knees 03/03/2015   Arthritis 01/11/2014   Soft tissue lesion of shoulder region 01/11/2014   Diabetes mellitus, type 2 (HCC) 01/11/2014   H/O renal calculi 01/11/2014   Hypertension 01/11/2014  HLD (hyperlipidemia) 01/11/2014   Neuropathy 01/11/2014   OP (osteoporosis) 01/11/2014   H/O malignant neoplasm of breast 01/11/2014     Palliative Care Assessment & Plan   Recommendations/Plan: PMT is following  Code Status:    Code Status Orders  (From admission, onward)           Start     Ordered   05/22/23 1923  Full code  Continuous       Question:  By:  Answer:  Consent: discussion documented in EHR   05/22/23 1924           Code Status History     Date Active Date Inactive Code Status Order ID Comments User Context   01/08/2023 1035 01/15/2023 2205 DNR [id number]  Canary Brim, NP Inpatient   01/05/2023 1657 01/08/2023 1034 Full Code [id number]  Cox, Amy N, DO ED   12/19/2022 0741 12/24/2022 2305 Full Code [id number]  Lorretta Harp, MD ED   11/02/2022 0143 11/09/2022 1707 Full Code [id number]  Andris Baumann, MD ED   08/22/2022 2346 08/25/2022 2014 Full Code [id number]  Lurene Shadow, MD ED   08/16/2022 1701 08/18/2022 2022 Full Code [id number]  Lorretta Harp, MD ED      Advance Directive Documentation    Flowsheet Row Most Recent Value  Type of Advance Directive Living will  Pre-existing out of facility DNR order (yellow form or pink MOST form) --  "MOST" Form in Place? --       Thank you for allowing the Palliative Medicine Team to assist in the care of this patient.    Morton Stall, NP  Please contact Palliative Medicine Team phone at [phone] for questions and concerns.

## 2023-05-31 ENCOUNTER — Other Ambulatory Visit: Payer: Self-pay | Admitting: Infectious Diseases

## 2023-05-31 ENCOUNTER — Other Ambulatory Visit: Payer: Self-pay

## 2023-05-31 ENCOUNTER — Other Ambulatory Visit (HOSPITAL_COMMUNITY): Payer: Self-pay

## 2023-05-31 DIAGNOSIS — B9562 Methicillin resistant Staphylococcus aureus infection as the cause of diseases classified elsewhere: Secondary | ICD-10-CM

## 2023-05-31 DIAGNOSIS — L89619 Pressure ulcer of right heel, unspecified stage: Secondary | ICD-10-CM | POA: Diagnosis not present

## 2023-05-31 DIAGNOSIS — L89154 Pressure ulcer of sacral region, stage 4: Secondary | ICD-10-CM

## 2023-05-31 DIAGNOSIS — L89219 Pressure ulcer of right hip, unspecified stage: Secondary | ICD-10-CM | POA: Diagnosis not present

## 2023-05-31 DIAGNOSIS — L89229 Pressure ulcer of left hip, unspecified stage: Secondary | ICD-10-CM | POA: Diagnosis not present

## 2023-05-31 DIAGNOSIS — Z7189 Other specified counseling: Secondary | ICD-10-CM | POA: Diagnosis not present

## 2023-05-31 DIAGNOSIS — L089 Local infection of the skin and subcutaneous tissue, unspecified: Secondary | ICD-10-CM

## 2023-05-31 DIAGNOSIS — L89629 Pressure ulcer of left heel, unspecified stage: Secondary | ICD-10-CM | POA: Diagnosis not present

## 2023-05-31 LAB — CBC WITH DIFFERENTIAL/PLATELET
Abs Immature Granulocytes: 0.12 10*3/uL — ABNORMAL HIGH (ref 0.00–0.07)
Basophils Absolute: 0.1 10*3/uL (ref 0.0–0.1)
Basophils Relative: 1 %
Eosinophils Absolute: 0.3 10*3/uL (ref 0.0–0.5)
Eosinophils Relative: 3 %
HCT: 27.5 % — ABNORMAL LOW (ref 36.0–46.0)
Hemoglobin: 8.1 g/dL — ABNORMAL LOW (ref 12.0–15.0)
Immature Granulocytes: 1 %
Lymphocytes Relative: 28 %
Lymphs Abs: 2.5 10*3/uL (ref 0.7–4.0)
MCH: 26.6 pg (ref 26.0–34.0)
MCHC: 29.5 g/dL — ABNORMAL LOW (ref 30.0–36.0)
MCV: 90.5 fL (ref 80.0–100.0)
Monocytes Absolute: 0.4 10*3/uL (ref 0.1–1.0)
Monocytes Relative: 5 %
Neutro Abs: 5.6 10*3/uL (ref 1.7–7.7)
Neutrophils Relative %: 62 %
Platelets: 425 10*3/uL — ABNORMAL HIGH (ref 150–400)
RBC: 3.04 MIL/uL — ABNORMAL LOW (ref 3.87–5.11)
RDW: 19.6 % — ABNORMAL HIGH (ref 11.5–15.5)
WBC: 9.1 10*3/uL (ref 4.0–10.5)
nRBC: 0.2 % (ref 0.0–0.2)

## 2023-05-31 LAB — BASIC METABOLIC PANEL
Anion gap: 9 (ref 5–15)
BUN: 16 mg/dL (ref 8–23)
CO2: 22 mmol/L (ref 22–32)
Calcium: 7.9 mg/dL — ABNORMAL LOW (ref 8.9–10.3)
Chloride: 108 mmol/L (ref 98–111)
Creatinine, Ser: 0.73 mg/dL (ref 0.44–1.00)
GFR, Estimated: >60 mL/min (ref >60)
Glucose, Bld: 196 mg/dL — ABNORMAL HIGH (ref 70–99)
Potassium: 3.6 mmol/L (ref 3.5–5.1)
Sodium: 139 mmol/L (ref 135–145)

## 2023-05-31 LAB — AEROBIC CULTURE W GRAM STAIN (SUPERFICIAL SPECIMEN): Gram Stain: NONE SEEN

## 2023-05-31 LAB — MAGNESIUM: Magnesium: 2.2 mg/dL (ref 1.7–2.4)

## 2023-05-31 LAB — PHOSPHORUS: Phosphorus: 2.1 mg/dL — ABNORMAL LOW (ref 2.5–4.6)

## 2023-05-31 LAB — VITAMIN D 25 HYDROXY (VIT D DEFICIENCY, FRACTURES): Vit D, 25-Hydroxy: 30.9 ng/mL (ref 30–100)

## 2023-05-31 LAB — GLUCOSE, CAPILLARY: Glucose-Capillary: 102 mg/dL — ABNORMAL HIGH (ref 70–99)

## 2023-05-31 MED ORDER — SODIUM PHOSPHATES 45 MMOLE/15ML IV SOLN
15.0000 mmol | Freq: Once | INTRAVENOUS | Status: AC
  Administered 2023-05-31: 15 mmol via INTRAVENOUS
  Filled 2023-05-31: qty 5

## 2023-05-31 MED ORDER — ZINC SULFATE 220 (50 ZN) MG PO CAPS: 220.0000 mg | ORAL_CAPSULE | Freq: Every day | ORAL | 0 refills | Status: DC

## 2023-05-31 MED ORDER — CIPROFLOXACIN HCL 250 MG PO TABS: 250.0000 mg | ORAL_TABLET | Freq: Two times a day (BID) | ORAL | 0 refills | Status: AC

## 2023-05-31 MED ORDER — FERROUS SULFATE 325 (65 FE) MG PO TABS: 325.0000 mg | ORAL_TABLET | Freq: Two times a day (BID) | ORAL | 3 refills | Status: AC

## 2023-05-31 MED ORDER — ASCORBIC ACID 500 MG PO TABS: 500.0000 mg | ORAL_TABLET | Freq: Two times a day (BID) | ORAL | 0 refills | Status: AC

## 2023-05-31 MED ORDER — VITAMIN B-1 100 MG PO TABS: 100.0000 mg | ORAL_TABLET | Freq: Every day | ORAL | 0 refills | Status: AC

## 2023-05-31 MED ORDER — LINEZOLID 600 MG PO TABS: 600.0000 mg | ORAL_TABLET | Freq: Two times a day (BID) | ORAL | 0 refills | Status: DC

## 2023-05-31 NOTE — TOC Transition Note (Signed)
Transition of Care [facility]) - CM/SW Discharge Note   Patient Details  Name: [name] MRN: [id number] Date of Birth: 04-15-1941  Transition of Care [facility]) CM/SW Contact:  Truddie Hidden, RN Phone Number: 05/31/2023, 4:02 PM   Clinical Narrative:    Spoke with patient at the bedside to advised of her discharge plan home today. She was advised HH would come see her likely 7/22 and her husband will transport her home.   Nurse notified of discharge plan.   TOC signing off    Final next level of care: Home w Home Health Services Barriers to Discharge: Barriers Resolved   Patient Goals and CMS Choice      Discharge Placement                      Patient and family notified of of transfer: 05/31/23  Discharge Plan and Services Additional resources added to the After Visit Summary for       Post Acute Care Choice: NA                    HH Arranged: PT, RN HH Agency: [facility] Date [facility] Agency Contacted: 05/31/23 Time HH Agency Contacted: 1304 Representative spoke with at [facility] Agency: Adelina Mings  Social Determinants of Health (SDOH) Interventions SDOH Screenings   Food Insecurity: No Food Insecurity (05/22/2023)  Housing: Low Risk  (05/22/2023)  Transportation Needs: No Transportation Needs (05/22/2023)  Utilities: Not At Risk (05/22/2023)  Depression (PHQ2-9): Low Risk  (03/28/2023)  Tobacco Use: Low Risk  (05/27/2023)   Received from [facility]     Readmission Risk Interventions    05/23/2023   10:56 AM 12/24/2022   11:39 AM 11/07/2022    4:05 PM  Readmission Risk Prevention Plan  Transportation Screening Complete Complete Complete  Medication Review Oceanographer) Complete Complete Complete  PCP or Specialist appointment within 3-5 days of discharge Complete    HRI or Home Care Consult  Complete   SW Recovery Care/Counseling Consult Complete    Palliative Care Screening Not Applicable    Skilled Nursing Facility Not Applicable  Not Applicable Complete

## 2023-05-31 NOTE — Progress Notes (Signed)
Daily Progress Note   Patient Name: [name]       Date: 05/31/2023 DOB: 03-May-1941  Age: 82 y.o. MRN#: [id number] Attending Physician: Loyce Dys, MD Primary Care Physician: Barbette Reichmann, MD Admit Date: 05/22/2023  Reason for Consultation/Follow-up: Establishing goals of care  Subjective: Notes and labs reviewed. In to see patient. She discusses plans to discharge home, and is happy for this. She states she will continue to determine her wishes for care moving forward.   Length of Stay: 9  Current Medications: Scheduled Meds:   allopurinol  300 mg Oral Daily   vitamin C  500 mg Oral BID   aspirin EC  81 mg Oral Daily   atenolol  50 mg Oral Daily   atorvastatin  40 mg Oral Daily   Chlorhexidine Gluconate Cloth  6 each Topical Daily   collagenase   Topical Daily   cyanocobalamin  1,000 mcg Oral Daily   enoxaparin (LOVENOX) injection  30 mg Subcutaneous Q24H   feeding supplement  237 mL Oral TID BM   ferrous sulfate  325 mg Oral BID WC   gabapentin  100 mg Oral Daily   insulin aspart  0-5 Units Subcutaneous QHS   insulin aspart  0-9 Units Subcutaneous TID WC   levothyroxine  88 mcg Oral Q0600   multivitamin with minerals  1 tablet Oral Daily   polyethylene glycol  17 g Oral Daily   thiamine  100 mg Oral Daily   zinc sulfate  220 mg Oral Daily    Continuous Infusions:  sodium chloride 75 mL/hr at 05/30/23 1843   sodium chloride Stopped (05/26/23 2129)   DAPTOmycin (CUBICIN) 350 mg in sodium chloride 0.9 % IVPB 350 mg (05/30/23 1357)   piperacillin-tazobactam (ZOSYN)  IV 3.375 g (05/31/23 1226)   sodium phosphate 15 mmol in dextrose 5 % 250 mL infusion 15 mmol (05/31/23 1347)    PRN Meds: sodium chloride, acetaminophen **OR** acetaminophen,  HYDROcodone-acetaminophen, morphine injection, ondansetron (ZOFRAN) IV, polyethylene glycol  Physical Exam Pulmonary:     Effort: Pulmonary effort is normal.  Neurological:     Mental Status: She is alert.             Vital Signs: BP 115/61 (BP Location: Left Arm)   Pulse 71   Temp 98.4 F (36.9 C) (  Oral)   Resp 16   Ht 4\' 11"  (1.499 m)   Wt 44.5 kg   SpO2 91%   BMI 19.79 kg/m  SpO2: SpO2: 91 % O2 Device: O2 Device: Room Air O2 Flow Rate:    Intake/output summary:  Intake/Output Summary (Last 24 hours) at 05/31/2023 1504 Last data filed at 05/31/2023 0800 Gross per 24 hour  Intake 2462.42 ml  Output 2200 ml  Net 262.42 ml   LBM: Last BM Date : 05/30/23 Baseline Weight: Weight: 52.6 kg Most recent weight: Weight: 44.5 kg        Patient Active Problem List   Diagnosis Date Noted   Right foot ulcer, with fat layer exposed (HCC) 05/23/2023   Cellulitis 05/22/2023   General weakness 01/31/2023   Osteomyelitis of right hip (HCC) 01/13/2023   Hypoalbuminemia 01/10/2023   Decubitus ulcer of trochanter, right, stage III (HCC) 01/06/2023   Wound infection 01/05/2023   Severe sepsis (HCC) 01/05/2023   Anemia of chronic disease 01/05/2023   Fever 12/21/2022   Leukocytosis 12/20/2022   NSTEMI (non-ST elevated myocardial infarction) (HCC) 12/19/2022   Lower urinary tract infectious disease 12/19/2022   Type II diabetes mellitus with renal manifestations (HCC) 12/19/2022   CKD (chronic kidney disease) stage 2, GFR 60-89 ml/min 12/19/2022   DVT (deep venous thrombosis) (HCC) 12/19/2022   Ulcer of both feet, limited to breakdown of skin (HCC) 12/19/2022   Reactive thrombocytosis 11/04/2022   Acute deep vein thrombosis (DVT) of femoral vein of left lower extremity (HCC) 11/02/2022   Chronic ulcer of great toe of left foot with fat layer exposed (HCC) 11/02/2022   Anemia due to stage 3a chronic kidney disease (HCC) 11/02/2022   Acute on chronic urinary retention 11/02/2022    Thrombus 11/02/2022   Pressure ulcers of skin of multiple topographic sites 08/23/2022   Acute metabolic encephalopathy 08/22/2022   Sepsis (HCC) 08/22/2022   Acute diverticulitis 08/22/2022   Hydroureteronephrosis 08/22/2022   Hypokalemia 08/22/2022   Protein-calorie malnutrition, severe (HCC) 08/18/2022   Cellulitis of lower extremity 08/17/2022   Cellulitis of both lower extremities 08/16/2022   Diabetes mellitus without complication (HCC)    Gout    Chronic diastolic CHF (congestive heart failure) (HCC)    Iron deficiency anemia    Diarrhea    Cervical spondylosis 03/09/2020   Lower limb ulcer, ankle, right, limited to breakdown of skin (HCC) 09/09/2018   Chronic gouty arthritis 04/29/2018   Generalized osteoarthritis of hand 02/12/2018   Hyperuricemia 02/12/2018   Swelling of finger 02/12/2018   PAD (peripheral artery disease) (HCC) 04/30/2017   Lymphedema 04/30/2017   Bilateral edema of lower extremity 04/03/2017   Ecchymosis 04/03/2017   Leg pain, bilateral 04/03/2017   Acquired hypothyroidism 01/24/2017   Benign essential hypertension 01/24/2017   History of mastectomy, right 01/24/2017   Primary osteoarthritis of left knee 02/29/2016   Cancer of right breast (HCC) 04/17/2015   Chronic pain 03/03/2015   Chronic pain of both knees 03/03/2015   Arthritis 01/11/2014   Soft tissue lesion of shoulder region 01/11/2014   Diabetes mellitus, type 2 (HCC) 01/11/2014   H/O renal calculi 01/11/2014   Hypertension 01/11/2014   HLD (hyperlipidemia) 01/11/2014   Neuropathy 01/11/2014   OP (osteoporosis) 01/11/2014   H/O malignant neoplasm of breast 01/11/2014    Palliative Care Assessment & Plan     Recommendations/Plan: Patient will continue to consider her thoughts on care moving forward.   Code Status:    Code Status Orders  (From  admission, onward)           Start     Ordered   05/22/23 1923  Full code  Continuous       Question:  By:  Answer:  Consent:  discussion documented in EHR   05/22/23 1924           Code Status History     Date Active Date Inactive Code Status Order ID Comments User Context   01/08/2023 1035 01/15/2023 2205 DNR [id number]  Canary Brim, NP Inpatient   01/05/2023 1657 01/08/2023 1034 Full Code [id number]  Cox, Amy Dorris Carnes, DO ED   12/19/2022 0741 12/24/2022 2305 Full Code [id number]  Lorretta Harp, MD ED   11/02/2022 0143 11/09/2022 1707 Full Code [id number]  Andris Baumann, MD ED   08/22/2022 2346 08/25/2022 2014 Full Code [id number]  Lurene Shadow, MD ED   08/16/2022 1701 08/18/2022 2022 Full Code [id number]  Lorretta Harp, MD ED      Advance Directive Documentation    Flowsheet Row Most Recent Value  Type of Advance Directive Living will  Pre-existing out of facility DNR order (yellow form or pink MOST form) --  "MOST" Form in Place? --    Thank you for allowing the Palliative Medicine Team to assist in the care of this patient.    Morton Stall, NP  Please contact Palliative Medicine Team phone at [phone] for questions and concerns.

## 2023-05-31 NOTE — Discharge Instructions (Signed)
 While on the antibiotic linezolid avoid or minimize following foods and drinks as they may interact with linezolid.  These foods and drinks contain tyramine.  Tyramine is a natural product found in some plants and animals.  Too much tyramine in combination with linezolid can cause high levels of serotonin in the body.  Serotonin is a chemical in our body that controls mood, sleep, digestion, and other functions.  Several signs of too much serotonin in the body (also called serotonin syndrome) are fast heart rate, sweating, fevers, high blood pressure, muscle twitching, and confusion.  It is important to seek medical attention if you have these symptoms.   Avoid foods and beverages that are high in tyramine while taking linezolid, including: Alcoholic beverages (such as beer, red wine, vermouth, sherry) Aged cheeses (such as cheddar, blue cheese, swiss, feta, parmesan, camembert) Fermented or pickled foods (such as sauerkraut, pickled beets, pickled peppers, pickled cucumbers/pickles, kim chee/kimchi)  Dried/aged, smoked or processed meats and sausages (such as salami, pepperoni, liverwurst, hot dogs, bologna, bacon) Soybean products (such as soy sauce, tofu) Preserved fish (such as pickled herring) Products that contain large amounts of yeast (such as bouillon cubes, powdered soup/gravy, homemade or sourdough bread)  Broad/fava beans  Following foods are OK to eat while taking linezolid: Pasteurized cheeses (such as [place], ricotta, cottage cheese, cream cheese) Vegetables (not fermented or pickled) Non-cured or smoked meats

## 2023-05-31 NOTE — TOC Benefit Eligibility Note (Signed)
Pharmacy Patient Advocate Encounter  Insurance verification completed.    The patient is insured through [employer]/ [employer]   Ran test claim for linezolid (Zyvox) 600 mg tablets and the current 14 day co-pay is $100.00.   This test claim was processed through [facility]- copay amounts may vary at other pharmacies due to pharmacy/plan contracts, or as the patient moves through the different stages of their insurance plan.    Roland Earl, CPHT Pharmacy Patient Advocate Specialist [facility] Patient Advocate Team Direct Number: [phone]  Fax: [phone]

## 2023-05-31 NOTE — Discharge Summary (Signed)
Physician Discharge Summary   Patient: [name] MRN: [id number] DOB: 13-Jun-1941  Admit date:     05/22/2023  Discharge date: 05/31/23  Discharge Physician: Loyce Dys   PCP: Barbette Reichmann, MD   Recommendations at discharge:   Will do cbc/ cmp in 1 week Follow up with ID in 2 weeks   Discharge Diagnoses: Cellulitis- multiple open wounds on extremities. Osteomyelitis greater trochanter, left foot Pressure ulcers - Patient with a right hip stage IV pressure ulcer, bilateral heel stage III pressure injuries, sacral unstageable pressure injury and left hip unstageable pressure injury Anemia of chronic disease Severe Malnutrition Hypotension-improved Diabetes mellitus, type 2 [facility])   Hospital Course: [name] is a 82 y.o. 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  Patient reports these have been present since approximately January.  Patient follows up at wound care clinic. Marland Kitchen  Patient's wounds were felt to be worsening and possibly infected started antibiotics, admitted to hospitalist service.   Podiatry team suggested MRI foot, vascular eval. Vascular  suggested CTA abd/ pelvis, not a candidate for angiogram. Her Hb she got a unit of blood. MRI pelvis, left foot showed osteomyelitis. Podiatry advised conservative management. Palliative care discussed with family regarding goals of care, she wants to be Full code for now wishes to discuss with family regarding CODE STATUS. Patient is a poor surgical candidate, podiatry advise conservative management All these wounds have MRSA And one has pseudomonas So will treat MRSA with linezolid for 2 weeks 600mg  PO BID Will do ciprofloxacin 250mg  Po BID for 2 weeks  Patient should follow up with ID in 2weeks and should have cbc/cmp in 1 week.  Below are the wound care instructions: Sacral Unstageable pressure injury and left hip Unstageable pressure injury  with 100% nonviable tissue:  Cleanse wounds daily with Vashe (pure hypochlorous acid, Lawson # [phone]), pat dry. Apply collagenase (santyl) to wound, top with saline dampened gauze, top with dry gauze, secure with silicone foam. Right medial LE full thickness wound and bilateral heel Stage 3 pressure injuries:  Cleanse wounds daily with Vashe, pat dry. Fill/cover defect with Vashe moistened gauze, top with dry gauze, cover with ABD pad and secure with Kerlix roll gauze/paper tape. Place feet into Prevalon boots. Right hip Stage 4 pressure injury: Cleanse twice daily with Vashe, pat surrounding skin dry. Fill defect with Vashe moistened gauze, top with dry gauze, ABD pad and secure with paper tape.  Bilateral LEs  with partial thickness skin loss:  Wash legs daily during bathing, pat dry. When dry, moisturize skin with house moisturizing cream, Sween 24.  Consultants: ID Procedures performed: none  Disposition: Home health Diet recommendation:  Cardiac diet DISCHARGE MEDICATION: Allergies as of 05/31/2023       Reactions   Other Anaphylaxis   Anesthisia has been a health issue for her in the past.    Sulfa Antibiotics Rash        Medication List     STOP taking these medications    furosemide 40 MG tablet Commonly known as: LASIX   lisinopril 5 MG tablet Commonly known as: ZESTRIL       TAKE these medications    acetaminophen 500 MG tablet Commonly known as: TYLENOL Take 500-1,000 mg by mouth 4 (four) times daily as needed for mild pain or moderate pain.   allopurinol 300 MG tablet Commonly known as: ZYLOPRIM Take 300 mg by mouth daily.   ascorbic  acid 500 MG tablet Commonly known as: VITAMIN C Take 1 tablet (500 mg total) by mouth 2 (two) times daily.   aspirin EC 81 MG tablet Take 1 tablet (81 mg total) by mouth daily. Swallow whole.   atenolol 50 MG tablet Commonly known as: TENORMIN Take 50 mg by mouth daily.   atorvastatin 40 MG tablet Commonly known as:  LIPITOR Take 40 mg by mouth daily.   ciprofloxacin 250 MG tablet Commonly known as: CIPRO Take 1 tablet (250 mg total) by mouth 2 (two) times daily for 14 days.   cyanocobalamin 1000 MCG tablet Commonly known as: VITAMIN B12 Take 1,000 mcg by mouth daily.   diphenoxylate-atropine 2.5-0.025 MG tablet Commonly known as: LOMOTIL Take 1-2 tablets by mouth 3 (three) times daily as needed.   ferrous sulfate 325 (65 FE) MG tablet Take 1 tablet (325 mg total) by mouth 2 (two) times daily with a meal.   gabapentin 100 MG capsule Commonly known as: NEURONTIN TAKE 1 CAPSULE BY MOUTH ONCE DAILY FOR 7 DAYS THEN TAKE 1 CAPSULES BY MOUTH TWICE DAILY   gentamicin cream 0.1 % Commonly known as: GARAMYCIN Apply 1 Application topically in the morning and at bedtime.   HYDROcodone-acetaminophen 7.5-325 MG tablet Commonly known as: NORCO Take 1 tablet by mouth 2 (two) times daily as needed for moderate pain or severe pain.   levothyroxine 88 MCG tablet Commonly known as: SYNTHROID Take 88 mcg by mouth daily.   linezolid 600 MG tablet Commonly known as: ZYVOX Take 1 tablet (600 mg total) by mouth 2 (two) times daily. Start taking on: June 01, 2023   metFORMIN 1000 MG tablet Commonly known as: GLUCOPHAGE Take 1,000 mg by mouth in the morning and at bedtime.   multivitamin with minerals Tabs tablet Take 1 tablet by mouth daily.   polyethylene glycol 17 g packet Commonly known as: MIRALAX / GLYCOLAX Take 17 g by mouth daily.   Replens External Comfort Gel Apply 1 application  topically daily.   sitaGLIPtin 50 MG tablet Commonly known as: JANUVIA Take 50 mg by mouth daily.   thiamine 100 MG tablet Commonly known as: Vitamin B-1 Take 1 tablet (100 mg total) by mouth daily. Start taking on: June 01, 2023   zinc sulfate 220 (50 Zn) MG capsule Take 1 capsule (220 mg total) by mouth daily. Start taking on: June 01, 2023        Follow-up Information     [facility]  Follow up.   Specialty: Home Health Services Why: They will follow up with you for your home health needs. Contact information: [address] [place] [address] [id number]                Discharge Exam: Ceasar Mons Weights   05/22/23 1751 05/22/23 2243  Weight: 52.6 kg 44.5 kg   General -elderly female lying in bed cachectic HEENT -atraumatic normocephalic Lung - Clear, rales, rhonchi, wheezes. Heart - S1, S2 heard, no murmur Neuro -alert, awake, non focal exam. Skin - Warm and dry.  Multiple skin ulcers as noted above with dressing in place    Condition at discharge: good   Discharge time spent:  35 minutes.  Signed: Loyce Dys, MD [facility] 05/31/2023

## 2023-05-31 NOTE — TOC Progression Note (Signed)
Transition of Care [facility]) - Progression Note    Patient Details  Name: [name] MRN: [id number] Date of Birth: Sep 05, 1941  Transition of Care [facility]) CM/SW Contact  Truddie Hidden, RN Phone Number: 05/31/2023, 11:59 AM  Clinical Narrative:    Patient discharging home with oral antibiotics.  Spoke with Adelina Mings from [facility] regarding discharge home today. She is requesting wound care instructions. RNCM also advised patient was discharging with PO antibiotics. SOC is likely 7/22.   Spoke with patient's spouse to advised him of discharge home today. He will transport patient home at 5pm. He was advised HH had been notified of discharge and would call to schedule an appointment to see the patient. Patient's spouse inquired about IV antibiotics. He was informed per the infectious disease MD patient would discharge with PO antibiotics.   TOC signing off.      Expected Discharge Plan: Home/Self Care Barriers to Discharge: Continued Medical Work up  Expected Discharge Plan and Services     Post Acute Care Choice: NA Living arrangements for the past 2 months: Single Family Home                                       Social Determinants of Health (SDOH) Interventions SDOH Screenings   Food Insecurity: No Food Insecurity (05/22/2023)  Housing: Low Risk  (05/22/2023)  Transportation Needs: No Transportation Needs (05/22/2023)  Utilities: Not At Risk (05/22/2023)  Depression (PHQ2-9): Low Risk  (03/28/2023)  Tobacco Use: Low Risk  (05/27/2023)   Received from [facility]    Readmission Risk Interventions    05/23/2023   10:56 AM 12/24/2022   11:39 AM 11/07/2022    4:05 PM  Readmission Risk Prevention Plan  Transportation Screening Complete Complete Complete  Medication Review (RN Care Manager) Complete Complete Complete  PCP or Specialist appointment within 3-5 days of discharge Complete    HRI or Home Care Consult  Complete   SW Recovery  Care/Counseling Consult Complete    Palliative Care Screening Not Applicable    Skilled Nursing Facility Not Applicable Not Applicable Complete

## 2023-05-31 NOTE — Consult Note (Signed)
Pharmacy Antibiotic Note  [name] is a 82 y.o. female w/ PMH of gout, HLD, DM, peripheral neuropathy, hypothyroidism, ulceration of bony prominences, HTN admitted on 05/22/2023 with worsening of chronic wounds. Imaging ordered. Pharmacy has been consulted for daptomycin and Zosyn dosing.   05/31/2023 Day #8 antibiotics - piperacillin/tazobactam. Day #3 Daptomycin daptomycin Renal: SCr 0.7 7/17 WBC WNL Afebrile 7/17 CK 43 (on atorvastatin 40mg ) Wounds cultures with MRSA and pseudomonas (Susc pending)  Vancomycin levels 7/14: Vancomycin level at 0435 > 60 mcg/mL - Unfortunately the level was drawn during the infusion/vancomycin dose  Plan:  1) Zosyn 3.375g IV q8h (4 hour infusion).  2) Daptomycin 350mg  (8mg /kg) IV q24h  Checking CK weekly Monitor on atorvastatin 40mg  daily  3) plan for abx at d/c pending   Height: 4\' 11"  (149.9 cm) Weight: 44.5 kg (98 lb) IBW/kg (Calculated) : 43.2  Temp (24hrs), Avg:98 F (36.7 C), Min:97.5 F (36.4 C), Max:98.4 F (36.9 C)  Recent Labs  Lab 05/25/23 0601 05/26/23 0435 05/27/23 0519 05/29/23 0542 05/30/23 0527  WBC 11.3*  --  10.1  --  10.3  CREATININE  --  0.75  --  0.70 0.70  VANCORANDOM  --   --  >60*  --   --     Estimated Creatinine Clearance: 38.3 mL/min (by C-G formula based on SCr of 0.7 mg/dL).    Allergies  Allergen Reactions   Other Anaphylaxis    Anesthisia has been a health issue for her in the past.    Sulfa Antibiotics Rash    Antimicrobials this admission: 7/10 vancomycin >> 7/15 7/10 Zosyn >>  7/16 dapto >>  Microbiology results: 7/10 BCx: NG final 7/16 wound cxs x3: MRSA and pseudomonas   Thank you for allowing pharmacy to be a part of this patient's care.  Juliette Alcide, PharmD, BCPS, BCIDP Work Cell: [phone] 05/31/2023 8:48 AM

## 2023-05-31 NOTE — Progress Notes (Signed)
Date of Admission:  05/22/2023      ID: [name] is a 82 y.o. female with   Principal Problem:   Cellulitis Active Problems:   Diabetes mellitus, type 2 (HCC)   Hypertension   Protein-calorie malnutrition, severe (HCC)   Anemia of chronic disease   Right foot ulcer, with fat layer exposed (HCC)    Subjective: Pt is doing okay she says  Medications:   allopurinol  300 mg Oral Daily   vitamin C  500 mg Oral BID   aspirin EC  81 mg Oral Daily   atenolol  50 mg Oral Daily   atorvastatin  40 mg Oral Daily   Chlorhexidine Gluconate Cloth  6 each Topical Daily   collagenase   Topical Daily   cyanocobalamin  1,000 mcg Oral Daily   enoxaparin (LOVENOX) injection  30 mg Subcutaneous Q24H   feeding supplement  237 mL Oral TID BM   ferrous sulfate  325 mg Oral BID WC   gabapentin  100 mg Oral Daily   insulin aspart  0-5 Units Subcutaneous QHS   insulin aspart  0-9 Units Subcutaneous TID WC   levothyroxine  88 mcg Oral Q0600   multivitamin with minerals  1 tablet Oral Daily   polyethylene glycol  17 g Oral Daily   thiamine  100 mg Oral Daily   zinc sulfate  220 mg Oral Daily    Objective: Vital signs in last 24 hours: Patient Vitals for the past 24 hrs:  BP Temp Temp src Pulse Resp SpO2  05/31/23 0500 115/61 98.4 F (36.9 C) Oral 71 16 91 %  05/30/23 1947 131/69 97.8 F (36.6 C) Oral 88 17 93 %  05/30/23 1551 130/61 (!) 97.5 F (36.4 C) -- 76 16 95 %      PHYSICAL EXAM:  General: Alert, cooperative,pale, chronically ill Lungs: b/l air entry. Heart: irregular. Abdomen: Soft,  Extremities: rt hip greater trochanter area stage 3 decub- healthy granulation tissue  Rt leg- above ankle superficial wound Heel decub    Left leg superficial wound- bleeds easily- no purulence Small heel decub     Skin: No rashes or lesions. Or bruising Lymph: Cervical, supraclavicular normal. Neurologic: Grossly non-focal  Lab Results    Latest Ref Rng & Units  05/31/2023    9:57 AM 05/30/2023    5:27 AM 05/27/2023    5:19 AM  CBC  WBC 4.0 - 10.5 K/uL 9.1  10.3  10.1   Hemoglobin 12.0 - 15.0 g/dL 8.1  8.1  8.4   Hematocrit 36.0 - 46.0 % 27.5  26.0  28.0   Platelets 150 - 400 K/uL 425  506  511        Latest Ref Rng & Units 05/31/2023    9:57 AM 05/30/2023    5:27 AM 05/29/2023    5:42 AM  CMP  Glucose 70 - 99 mg/dL 191  98    BUN 8 - 23 mg/dL 16  19    Creatinine 4.78 - 1.00 mg/dL 2.95  6.21  3.08   Sodium 135 - 145 mmol/L 139  137    Potassium 3.5 - 5.1 mmol/L 3.6  3.6    Chloride 98 - 111 mmol/L 108  108    CO2 22 - 32 mmol/L 22  23    Calcium 8.9 - 10.3 mg/dL 7.9  7.8        Microbiology: [facility] NG Wc rt hip staph aureus Rt leg culture staph aureus  Left leg culture staph aureus and pseudomonas    Assessment/Plan:  Multiple pressure wounds Rt hip Both heels  Superficial wounds legs after skin tear All these wounds have MRSA And one has pseudomonas So will treat MRSA with linezolid for 2 weeks 600mg  PO BID Will do ciprofloxacin 250mg  Po BID for 2 weeks Will do cbc/ cmp in 1 week Follow up with me in 2 weeks She has anemia, hypoalbuminemia and poor reserve that makes healing difficult Also she is not ambulatory. Family aware of all the confounding factors that is preventing healing Day 9 of IV antibiotic  History of CAD   H/o Ca breast  post right mastectomy   History of extensive iliofemoral DVT of the left side status post mechanical thrombectomy and IVC placement in November 06, 2022  Discussed the management with the patient spoke to her son and husband on the phone in detail Discussed with care team

## 2023-06-01 DIAGNOSIS — B9562 Methicillin resistant Staphylococcus aureus infection as the cause of diseases classified elsewhere: Secondary | ICD-10-CM | POA: Diagnosis not present

## 2023-06-01 DIAGNOSIS — Z556 Problems related to health literacy: Secondary | ICD-10-CM | POA: Diagnosis not present

## 2023-06-01 DIAGNOSIS — Z79891 Long term (current) use of opiate analgesic: Secondary | ICD-10-CM | POA: Diagnosis not present

## 2023-06-01 DIAGNOSIS — H539 Unspecified visual disturbance: Secondary | ICD-10-CM | POA: Diagnosis not present

## 2023-06-01 DIAGNOSIS — L89220 Pressure ulcer of left hip, unstageable: Secondary | ICD-10-CM | POA: Diagnosis not present

## 2023-06-01 DIAGNOSIS — I959 Hypotension, unspecified: Secondary | ICD-10-CM | POA: Diagnosis not present

## 2023-06-01 DIAGNOSIS — L988 Other specified disorders of the skin and subcutaneous tissue: Secondary | ICD-10-CM | POA: Diagnosis not present

## 2023-06-01 DIAGNOSIS — L0390 Cellulitis, unspecified: Secondary | ICD-10-CM | POA: Diagnosis not present

## 2023-06-01 DIAGNOSIS — E1169 Type 2 diabetes mellitus with other specified complication: Secondary | ICD-10-CM | POA: Diagnosis not present

## 2023-06-01 DIAGNOSIS — Z9181 History of falling: Secondary | ICD-10-CM | POA: Diagnosis not present

## 2023-06-01 DIAGNOSIS — E43 Unspecified severe protein-calorie malnutrition: Secondary | ICD-10-CM | POA: Diagnosis not present

## 2023-06-01 DIAGNOSIS — L89613 Pressure ulcer of right heel, stage 3: Secondary | ICD-10-CM | POA: Diagnosis not present

## 2023-06-01 DIAGNOSIS — D649 Anemia, unspecified: Secondary | ICD-10-CM | POA: Diagnosis not present

## 2023-06-01 DIAGNOSIS — Z960 Presence of urogenital implants: Secondary | ICD-10-CM | POA: Diagnosis not present

## 2023-06-01 DIAGNOSIS — Z7984 Long term (current) use of oral hypoglycemic drugs: Secondary | ICD-10-CM | POA: Diagnosis not present

## 2023-06-01 DIAGNOSIS — M86172 Other acute osteomyelitis, left ankle and foot: Secondary | ICD-10-CM | POA: Diagnosis not present

## 2023-06-01 DIAGNOSIS — E8809 Other disorders of plasma-protein metabolism, not elsewhere classified: Secondary | ICD-10-CM | POA: Diagnosis not present

## 2023-06-01 DIAGNOSIS — Z9011 Acquired absence of right breast and nipple: Secondary | ICD-10-CM | POA: Diagnosis not present

## 2023-06-01 DIAGNOSIS — E119 Type 2 diabetes mellitus without complications: Secondary | ICD-10-CM | POA: Diagnosis not present

## 2023-06-01 DIAGNOSIS — I2510 Atherosclerotic heart disease of native coronary artery without angina pectoris: Secondary | ICD-10-CM | POA: Diagnosis not present

## 2023-06-01 DIAGNOSIS — B965 Pseudomonas (aeruginosa) (mallei) (pseudomallei) as the cause of diseases classified elsewhere: Secondary | ICD-10-CM | POA: Diagnosis not present

## 2023-06-01 DIAGNOSIS — Z7982 Long term (current) use of aspirin: Secondary | ICD-10-CM | POA: Diagnosis not present

## 2023-06-01 DIAGNOSIS — L89150 Pressure ulcer of sacral region, unstageable: Secondary | ICD-10-CM | POA: Diagnosis not present

## 2023-06-01 DIAGNOSIS — Z993 Dependence on wheelchair: Secondary | ICD-10-CM | POA: Diagnosis not present

## 2023-06-01 DIAGNOSIS — L89214 Pressure ulcer of right hip, stage 4: Secondary | ICD-10-CM | POA: Diagnosis not present

## 2023-06-01 DIAGNOSIS — S51801D Unspecified open wound of right forearm, subsequent encounter: Secondary | ICD-10-CM | POA: Diagnosis not present

## 2023-06-01 DIAGNOSIS — L89623 Pressure ulcer of left heel, stage 3: Secondary | ICD-10-CM | POA: Diagnosis not present

## 2023-06-01 DIAGNOSIS — H9193 Unspecified hearing loss, bilateral: Secondary | ICD-10-CM | POA: Diagnosis not present

## 2023-06-01 LAB — AEROBIC CULTURE W GRAM STAIN (SUPERFICIAL SPECIMEN): Gram Stain: NONE SEEN

## 2023-06-03 LAB — MISC LABCORP TEST (SEND OUT): Labcorp test code: 96388

## 2023-06-04 NOTE — Progress Notes (Signed)
ANIKKA, MARSAN ([id number]) [id number] Page 1 of 17 Visit Report for 05/22/2023 Arrival Information Details Patient Name: Date of Service: [facility], [name]. 05/22/2023 1:00 PM Medical Record Number: [id number] Patient Account Number: [id number] Date of Birth/Sex: Treating RN: 10-30-1941 (82 y.o. [name] Primary Care Zayla Agar: Barbette Reichmann Other Clinician: Betha Loa Referring Knute Mazzuca: Treating Modene Andy/Extender: Adolph Pollack Weeks in Treatment: 70 Visit Information History Since Last Visit All ordered tests and consults were completed: No Patient Arrived: Wheel Chair Added or deleted any medications: No Arrival Time: 13:04 Any new allergies or adverse reactions: No Transfer Assistance: EasyPivot Patient Lift Had a fall or experienced change in No Patient Identification Verified: Yes activities of daily living that may affect Secondary Verification Process Completed: Yes risk of falls: Patient Requires Transmission-Based Precautions: No Signs or symptoms of abuse/neglect since last visito No Patient Has Alerts: Yes Hospitalized since last visit: No Patient Alerts: DM II Implantable device outside of the clinic excluding No ABI R 1.30 TBI 1.12 cellular tissue based products placed in the center ABI [name] 1.27 TBI 1.21 since last visit: Has Dressing in Place as Prescribed: Yes Pain Present Now: Yes Electronic Signature(s) Signed: 05/27/2023 4:35:54 PM By: Betha Loa Entered By: Betha Loa on 05/22/2023 13:07:21 -------------------------------------------------------------------------------- Clinic Level of Care Assessment Details Patient Name: Date of Service: [facility], [name] 05/22/2023 1:00 PM Medical Record Number: [id number] Patient Account Number: [id number] Date of Birth/Sex: Treating RN: [birth year] (82 y.o. [name] Primary Care Amneet Cendejas: Barbette Reichmann Other Clinician: Betha Loa Referring Andrell Bergeson: Treating Banner Huckaba/Extender: Adolph Pollack Weeks in Treatment: 93 Clinic Level of Care Assessment Items TOOL 4 Quantity Score []  - 0 Use when only an EandM is performed on FOLLOW-UP visit ASSESSMENTS - Nursing Assessment / Reassessment X- 1 10 Reassessment of Co-morbidities (includes updates in patient status) X- 1 5 Reassessment of Adherence to Treatment Plan [name], [name] [name] ([id number]) [id number] Page 2 of 17 ASSESSMENTS - Wound and Skin A ssessment / Reassessment []  - 0 Simple Wound Assessment / Reassessment - one wound X- 6 5 Complex Wound Assessment / Reassessment - multiple wounds []  - 0 Dermatologic / Skin Assessment (not related to wound area) ASSESSMENTS - Focused Assessment []  - 0 Circumferential Edema Measurements - multi extremities []  - 0 Nutritional Assessment / Counseling / Intervention []  - 0 Lower Extremity Assessment (monofilament, tuning fork, pulses) []  - 0 Peripheral Arterial Disease Assessment (using hand held doppler) ASSESSMENTS - Ostomy and/or Continence Assessment and Care []  - 0 Incontinence Assessment and Management []  - 0 Ostomy Care Assessment and Management (repouching, etc.) PROCESS - Coordination of Care X - Simple Patient / Family Education for ongoing care 1 15 []  - 0 Complex (extensive) Patient / Family Education for ongoing care []  - 0 Staff obtains Chiropractor, Records, T Results / Process Orders est []  - 0 Staff telephones HHA, Nursing Homes / Clarify orders / etc []  - 0 Routine Transfer to another Facility (non-emergent condition) []  - 0 Routine Hospital Admission (non-emergent condition) []  - 0 New Admissions / Manufacturing engineer / Ordering NPWT Apligraf, etc. , X- 1 20 Emergency Hospital Admission (emergent condition) X- 1 10 Simple Discharge Coordination []  - 0 Complex (extensive) Discharge Coordination PROCESS - Special Needs []  -  0 Pediatric / Minor Patient Management []  - 0 Isolation Patient Management []  - 0 Hearing / Language / Visual special needs []  - 0 Assessment of Community assistance (transportation, D/C planning, etc.) []  - 0 Additional  assistance / Altered mentation []  - 0 Support Surface(s) Assessment (bed, cushion, seat, etc.) INTERVENTIONS - Wound Cleansing / Measurement []  - 0 Simple Wound Cleansing - one wound X- 6 5 Complex Wound Cleansing - multiple wounds X- 1 5 Wound Imaging (photographs - any number of wounds) []  - 0 Wound Tracing (instead of photographs) []  - 0 Simple Wound Measurement - one wound X- 6 5 Complex Wound Measurement - multiple wounds INTERVENTIONS - Wound Dressings []  - 0 Small Wound Dressing one or multiple wounds X- 6 15 Medium Wound Dressing one or multiple wounds []  - 0 Large Wound Dressing one or multiple wounds []  - 0 Application of Medications - topical []  - 0 Application of Medications - injection INTERVENTIONS - Miscellaneous []  - 0 External ear exam [name], [name] ([id number]) [id number] Page 3 of 17 []  - 0 Specimen Collection (cultures, biopsies, blood, body fluids, etc.) []  - 0 Specimen(s) / Culture(s) sent or taken to Lab for analysis []  - 0 Patient Transfer (multiple staff / Michiel Sites Lift / Similar devices) []  - 0 Simple Staple / Suture removal (25 or less) []  - 0 Complex Staple / Suture removal (26 or more) []  - 0 Hypo / Hyperglycemic Management (close monitor of Blood Glucose) []  - 0 Ankle / Brachial Index (ABI) - do not check if billed separately X- 1 5 Vital Signs Has the patient been seen at the hospital within the last three years: Yes Total Score: 250 Level Of Care: New/Established - Level 5 Electronic Signature(s) Signed: 05/27/2023 4:35:54 PM By: Betha Loa Entered By: Betha Loa on 05/23/2023  08:28:39 -------------------------------------------------------------------------------- Encounter Discharge Information Details Patient Name: Date of Service: [facility], [name] 05/22/2023 1:00 PM Medical Record Number: [id number] Patient Account Number: [id number] Date of Birth/Sex: Treating RN: 1941/05/21 (82 y.o. [name] Primary Care Asriel Westrup: Barbette Reichmann Other Clinician: Betha Loa Referring Lorren Rossetti: Treating Casidee Jann/Extender: Adolph Pollack Weeks in Treatment: 84 Encounter Discharge Information Items Discharge Condition: Unstable Ambulatory Status: Wheelchair Discharge Destination: Emergency Room Telephoned: No Orders Sent: Yes Transportation: Private Auto Accompanied By: son Schedule Follow-up Appointment: Yes Clinical Summary of Care: Electronic Signature(s) Signed: 05/27/2023 4:35:54 PM By: Betha Loa Entered By: Betha Loa on 05/23/2023 08:30:47 Granade, [name] ([id number]) [id number] Page 4 of 17 -------------------------------------------------------------------------------- Lower Extremity Assessment Details Patient Name: Date of Service: [facility], [name] 05/22/2023 1:00 PM Medical Record Number: [id number] Patient Account Number: [id number] Date of Birth/Sex: Treating RN: Jan 10, 1941 (82 y.o. [name] Primary Care De Libman: Barbette Reichmann Other Clinician: Betha Loa Referring Paulita Licklider: Treating Jaquilla Woodroof/Extender: Adolph Pollack Weeks in Treatment: 34 Electronic Signature(s) Signed: 05/27/2023 4:35:54 PM By: Betha Loa Signed: 06/04/2023 8:06:11 AM By: Elliot Gurney, BSN, RN, CWS, Kim RN, BSN Entered By: Betha Loa on 05/22/2023 13:56:06 -------------------------------------------------------------------------------- Multi Wound Chart Details Patient Name: Date of Service: [facility], [name] 05/22/2023 1:00 PM Medical Record Number:  [id number] Patient Account Number: [id number] Date of Birth/Sex: Treating RN: 21-Aug-1941 (82 y.o. Cathlean Cower, Kim Primary Care Ruba Outen: Barbette Reichmann Other Clinician: Betha Loa Referring Kyara Boxer: Treating Sadiyah Kangas/Extender: Adolph Pollack Weeks in Treatment: 32 Vital Signs Height(in): Pulse(bpm): 91 Weight(lbs): 116 Blood Pressure(mmHg): 89/54 Body Mass Index(BMI): Temperature(F): 98.2 Respiratory Rate(breaths/min): 16 [11:Photos:] [12:No Photos] Right Trochanter Right, Medial Ankle Left Calcaneus Wound Location: Pressure Injury Pressure Injury Pressure Injury Wounding Event: Pressure Ulcer Pressure Ulcer Pressure Ulcer Primary Etiology: Cataracts, Lymphedema, Cataracts, Lymphedema, Cataracts, Lymphedema, Comorbid History: Hypertension, Peripheral Arterial Hypertension, Peripheral Arterial Hypertension, Peripheral Arterial Disease,  Peripheral Venous Disease, Disease, Peripheral Venous Disease, Disease, Peripheral Venous Disease, Type II Diabetes, Osteoarthritis, Type II Diabetes, Osteoarthritis, Type II Diabetes, Osteoarthritis, Neuropathy Neuropathy Neuropathy 10/24/2022 12/05/2022 11/28/2022 Date Acquired: 29 24 24  Weeks of Treatment: Open Open Open Wound Status: No No No Wound Recurrence: 3.5x4x1.6 11.5x5x0.2 1.5x2x0.2 Measurements [name] x W x D (cm) 10.996 45.16 2.356 A (cm) : rea 17.593 9.032 0.471 Volume (cm) : -430.40% -1050.00% 31.80% % Reduction in A rea: -8399.00% -2198.20% -36.10% % Reduction in Volume: Category/Stage IV Category/Stage IV Category/Stage III Classification: Medium Medium Medium Exudate A mount: Serosanguineous Serosanguineous Serosanguineous Exudate Type: red, brown red, brown red, brown Exudate Color: Murtagh, [name] ([id number]) [id number] Page 5 of 17 No No No Foul Odor A Cleansing: fter N/A N/A N/A Odor A nticipated Due to Product Use: Well defined, not attached Flat and  Intact Flat and Intact Wound Margin: Medium (34-66%) None Present (0%) Small (1-33%) Granulation A mount: Red N/A Pink Granulation Quality: Medium (34-66%) Large (67-100%) Large (67-100%) Necrotic A mount: Adherent Slough Eschar, Adherent Slough Adherent Slough Necrotic Tissue: Fascia: Yes Fascia: No Fat Layer (Subcutaneous Tissue): Yes Exposed Structures: Fat Layer (Subcutaneous Tissue): Yes Fat Layer (Subcutaneous Tissue): No Fascia: No Tendon: Yes Tendon: No Tendon: No Muscle: Yes Muscle: No Muscle: No Joint: No Joint: No Joint: No Bone: No Bone: No Bone: No None None None Epithelialization: Wound Number: [id number]  Photos: No Photos No Photos Left Trochanter Left, Anterior Lower Leg Right Calcaneus Wound Location: Pressure Injury Pressure Injury Pressure Injury Wounding Event: Pressure Ulcer Pressure Ulcer Pressure Ulcer Primary Etiology: Cataracts, Lymphedema, Cataracts, Lymphedema, Cataracts, Lymphedema, Comorbid History: Hypertension, Peripheral Arterial Hypertension, Peripheral Arterial Hypertension, Peripheral Arterial Disease, Peripheral Venous Disease, Disease, Peripheral Venous Disease, Disease, Peripheral Venous Disease, Type II Diabetes, Osteoarthritis, Type II Diabetes, Osteoarthritis, Type II Diabetes, Osteoarthritis, Neuropathy Neuropathy Neuropathy 05/22/2023 05/22/2023 07/23/2022 Date Acquired: 0 0 34 Weeks of Treatment: Open Open Open Wound Status: No No No Wound Recurrence: 1.8x2.3x0.1 8.5x5x0.1 4.5x5x0.2 Measurements [name] x W x D (cm) 3.252 33.379 17.671 A (cm) : rea 0.325 3.338 3.534 Volume (cm) : N/A N/A -87.50% % Reduction in A rea: N/A N/A -25.00% % Reduction in Volume: Category/Stage II Category/Stage I Category/Stage III Classification: Medium Medium Medium Exudate A mount: Serosanguineous Serosanguineous Serosanguineous Exudate Type: red, brown red, brown red, brown Exudate Color: No N/A No Foul Odor A Cleansing: fter N/A  N/A N/A Odor A nticipated Due to Product Use: N/A N/A Distinct, outline attached Wound Margin: N/A N/A Small (1-33%) Granulation A mount: N/A N/A Pink Granulation Quality: N/A N/A Large (67-100%) Necrotic A mount: N/A N/A Adherent Slough Necrotic Tissue: Fat Layer (Subcutaneous Tissue): Yes N/A Fat Layer (Subcutaneous Tissue): Yes Exposed Structures: Fascia: No Fascia: No Tendon: No Tendon: No Muscle: No Muscle: No Joint: No Joint: No Bone: No Bone: No N/A N/A None Epithelialization: Wound Number: 8 N/A N/A Photos: No Photos N/A N/A Midline Sacrum N/A N/A Wound Location: Pressure Injury N/A N/A Wounding Event: Pressure Ulcer N/A N/A Primary Etiology: Cataracts, Lymphedema, N/A N/A Comorbid History: Hypertension, Peripheral Arterial Disease, Peripheral Venous Disease, Type II Diabetes, Osteoarthritis, Neuropathy 07/23/2022 N/A N/A Date Acquired: 46 N/A N/A Weeks of Treatment: Open N/A N/A Wound Status: No N/A N/A Wound Recurrence: 0.1x0.1x0.1 N/A N/A Measurements [name] x W x D (cm) 0.008 N/A N/A A (cm) : rea 0.001 N/A N/A Volume (cm) : 99.90% N/A N/A % Reduction in A rea: 99.90% N/A N/A % Reduction in Volume: Category/Stage III N/A N/A Classification: Medium N/A  N/A Exudate A mount: Serosanguineous N/A N/A Exudate Type: ANGELLINA, FERDINAND [name] ([id number]) [id number] Page 6 of 17 red, brown N/A N/A Exudate Color: Yes N/A N/A Foul Odor A Cleansing: fter No N/A N/A Odor A nticipated Due to Product Use: Distinct, outline attached N/A N/A Wound Margin: Large (67-100%) N/A N/A Granulation A mount: Red N/A N/A Granulation Quality: Small (1-33%) N/A N/A Necrotic A mount: Adherent Slough N/A N/A Necrotic Tissue: Fat Layer (Subcutaneous Tissue): Yes N/A N/A Exposed Structures: Fascia: No Tendon: No Muscle: No Joint: No Bone: No None N/A N/A Epithelialization: Treatment Notes Electronic Signature(s) Signed:  05/27/2023 4:35:54 PM By: Betha Loa Entered By: Betha Loa on 05/22/2023 13:56:13 -------------------------------------------------------------------------------- Multi-Disciplinary Care Plan Details Patient Name: Date of Service: [facility], [name] 05/22/2023 1:00 PM Medical Record Number: [id number] Patient Account Number: [id number] Date of Birth/Sex: Treating RN: May 14, 1941 (82 y.o. [name] Primary Care Oluwatoyin Banales: Barbette Reichmann Other Clinician: Betha Loa Referring Desteni Piscopo: Treating Dupree Givler/Extender: Adolph Pollack Weeks in Treatment: 15 Active Inactive Necrotic Tissue Nursing Diagnoses: Impaired tissue integrity related to necrotic/devitalized tissue Knowledge deficit related to management of necrotic/devitalized tissue Goals: Necrotic/devitalized tissue will be minimized in the wound bed Date Initiated: 10/31/2022 Target Resolution Date: 04/12/2023 Goal Status: Active Patient/caregiver will verbalize understanding of reason and process for debridement of necrotic tissue Date Initiated: 10/31/2022 Target Resolution Date: 04/12/2023 Goal Status: Active Interventions: Assess patient pain level pre-, during and post procedure and prior to discharge Provide education on necrotic tissue and debridement process Treatment Activities: Apply topical anesthetic as ordered : 10/31/2022 Biologic debridement : 10/31/2022 Notes: Osteomyelitis Nursing Diagnoses: MAUD, RUBENDALL ([id number]) [id number].pdf Page 7 of 17 Potential for infection: osteomyelitis Goals: Patient/caregiver will verbalize understanding of disease process and disease management Date Initiated: 10/31/2022 Target Resolution Date: 04/13/2023 Goal Status: Active Signs and symptoms for osteomyelitis will be recognized and promptly addressed Date Initiated: 10/31/2022 Target Resolution Date: 04/13/2023 Goal Status: Active Interventions: Assess for  signs and symptoms of osteomyelitis resolution every visit Treatment Activities: Systemic antibiotics : 10/31/2022 Notes: Electronic Signature(s) Signed: 05/27/2023 4:35:54 PM By: Betha Loa Signed: 06/04/2023 8:06:11 AM By: Elliot Gurney, BSN, RN, CWS, Kim RN, BSN Entered By: Betha Loa on 05/23/2023 08:29:04 -------------------------------------------------------------------------------- Pain Assessment Details Patient Name: Date of Service: [facility], [name] 05/22/2023 1:00 PM Medical Record Number: [id number] Patient Account Number: [id number] Date of Birth/Sex: Treating RN: 1941-04-08 (82 y.o. [name] Primary Care Maxamilian Amadon: Barbette Reichmann Other Clinician: Betha Loa Referring Meir Elwood: Treating Mekel Haverstock/Extender: Adolph Pollack Weeks in Treatment: 65 Active Problems Location of Pain Severity and Description of Pain Patient Has Paino Yes Site Locations Pain Location: Generalized Pain, Pain in Ulcers Duration of the Pain. Constant / Intermittento Constant Rate the pain. Current Pain Level: 8 Character of Pain Describe the Pain: Burning, Other: stinging Pain Management and Medication Current Pain Management: Medication: Yes Cold Application: No Rest: No Massage: No Sklar, Eriana [name] ([id number]) [id number] Page 8 of 17 Activity: No T.E.N.S.: No Heat Application: No Leg drop or elevation: No Is the Current Pain Management Adequate: Inadequate How does your wound impact your activities of daily livingo Sleep: No Bathing: No Appetite: No Relationship With Others: No Bladder Continence: No Emotions: No Bowel Continence: No Work: No Toileting: No Drive: No Dressing: No Hobbies: No Electronic Signature(s) Signed: 05/27/2023 4:35:54 PM By: Betha Loa Signed: 06/04/2023 8:06:11 AM By: Elliot Gurney, BSN, RN, CWS, Kim RN, BSN Entered By: Betha Loa on 05/22/2023  13:11:05 -------------------------------------------------------------------------------- Patient/Caregiver Education Details Patient Name:  Date of Service: [facility], [name] 7/10/2024andnbsp1:00 PM Medical Record Number: [id number] Patient Account Number: [id number] Date of Birth/Gender: Treating RN: 05-18-1941 (82 y.o. [name] Primary Care Physician: Barbette Reichmann Other Clinician: Betha Loa Referring Physician: Treating Physician/Extender: Adolph Pollack Weeks in Treatment: 65 Education Assessment Education Provided To: Patient and Caregiver Education Topics Provided Infection: Handouts: Other: instructed to go to Emergency room for infection Methods: Explain/Verbal Responses: State content correctly Electronic Signature(s) Signed: 05/27/2023 4:35:54 PM By: Betha Loa Entered By: Betha Loa on 05/23/2023 08:29:40 -------------------------------------------------------------------------------- Wound Assessment Details Patient Name: Date of Service: [facility], [name] 05/22/2023 1:00 PM [name], [name] ([id number]) [id number] Page 9 of 17 Medical Record Number: [id number] Patient Account Number: [id number] Date of Birth/Sex: Treating RN: 1941-02-09 (82 y.o. Cathlean Cower, Kim Primary Care Lorrin Bodner: Barbette Reichmann Other Clinician: Betha Loa Referring Lizzette Carbonell: Treating Taziah Difatta/Extender: Adolph Pollack Weeks in Treatment: 34 Wound Status Wound Number: 11 Primary Pressure Ulcer Etiology: Wound Location: Right Trochanter Wound Open Wounding Event: Pressure Injury Status: Date Acquired: 10/24/2022 Comorbid Cataracts, Lymphedema, Hypertension, Peripheral Arterial Disease, Weeks Of Treatment: 29 History: Peripheral Venous Disease, Type II Diabetes, Osteoarthritis, Clustered Wound: No Neuropathy Photos Wound Measurements Length: (cm) 3.5 Width: (cm) 4 Depth: (cm)  1.6 Area: (cm) 10.996 Volume: (cm) 17.593 % Reduction in Area: -430.4% % Reduction in Volume: -8399% Epithelialization: None Wound Description Classification: Category/Stage IV Wound Margin: Well defined, not attached Exudate Amount: Medium Exudate Type: Serosanguineous Exudate Color: red, brown Foul Odor After Cleansing: No Slough/Fibrino Yes Wound Bed Granulation Amount: Medium (34-66%) Exposed Structure Granulation Quality: Red Fascia Exposed: Yes Necrotic Amount: Medium (34-66%) Fat Layer (Subcutaneous Tissue) Exposed: Yes Necrotic Quality: Adherent Slough Tendon Exposed: Yes Muscle Exposed: Yes Necrosis of Muscle: No Joint Exposed: No Bone Exposed: No Treatment Notes Wound #11 (Trochanter) Wound Laterality: Right Cleanser Soap and Water Discharge Instruction: Gently cleanse wound with antibacterial soap, rinse and pat dry prior to dressing wounds Vashe 5.8 (oz) Discharge Instruction: Use vashe 5.8 (oz) as directed Peri-Wound Care Topical Primary Dressing Gauze Discharge Instruction: Vashe wet to dry Secondary Dressing (BORDER) Zetuvit Plus SILICONE BORDER Dressing 5x5 (in/in) Discharge Instruction: Please do not put silicone bordered dressings under wraps. Use non-bordered dressing only. [name], [name] ([id number]) [id number] Page 10 of 17 Secured With Compression Wrap Compression Stockings Facilities manager) Signed: 05/27/2023 4:35:54 PM By: Betha Loa Signed: 06/04/2023 8:06:11 AM By: Elliot Gurney, BSN, RN, CWS, Kim RN, BSN Entered By: Betha Loa on 05/22/2023 13:53:04 -------------------------------------------------------------------------------- Wound Assessment Details Patient Name: Date of Service: [facility], [name] 05/22/2023 1:00 PM Medical Record Number: [id number] Patient Account Number: [id number] Date of Birth/Sex: Treating RN: April 13, 1941 (82 y.o. Cathlean Cower, Kim Primary Care Dannika Hilgeman: Barbette Reichmann Other Clinician: Betha Loa Referring Alleta Avery: Treating Raiden Yearwood/Extender: Adolph Pollack Weeks in Treatment: 34 Wound Status Wound Number: 12 Primary Pressure Ulcer Etiology: Wound Location: Right, Medial Ankle Wound Open Wounding Event: Pressure Injury Status: Date Acquired: 12/05/2022 Comorbid Cataracts, Lymphedema, Hypertension, Peripheral Arterial Disease, Weeks Of Treatment: 24 History: Peripheral Venous Disease, Type II Diabetes, Osteoarthritis, Clustered Wound: No Neuropathy Wound Measurements Length: (cm) 11.5 Width: (cm) 5 Depth: (cm) 0.2 Area: (cm) 45.16 Volume: (cm) 9.032 % Reduction in Area: -1050% % Reduction in Volume: -2198.2% Epithelialization: None Wound Description Classification: Category/Stage IV Wound Margin: Flat and Intact Exudate Amount: Medium Exudate Type: Serosanguineous Exudate Color: red, brown Foul Odor After Cleansing: No Slough/Fibrino Yes Wound Bed Granulation Amount: None Present (0%) Exposed Structure Necrotic  Amount: Large (67-100%) Fascia Exposed: No Necrotic Quality: Eschar, Adherent Slough Fat Layer (Subcutaneous Tissue) Exposed: No Tendon Exposed: No Muscle Exposed: No Joint Exposed: No Bone Exposed: No Treatment Notes Wound #12 (Ankle) Wound Laterality: Right, Medial Cleanser Soap and Water Discharge Instruction: Gently cleanse wound with antibacterial soap, rinse and pat dry prior to dressing wounds Stokes, Marthann [name] ([id number]) [id number] Page 11 of 17 Vashe 5.8 (oz) Discharge Instruction: Use vashe 5.8 (oz) as directed Peri-Wound Care Topical Primary Dressing Gauze Discharge Instruction: Vashe wet to dry Secondary Dressing Secured With Medipore T - 64M Medipore H Soft Cloth Surgical T ape ape, 2x2 (in/yd) Kerlix Roll Sterile or Non-Sterile 6-ply 4.5x4 (yd/yd) Discharge Instruction: Apply Kerlix as directed Compression Wrap Compression  Stockings Add-Ons Electronic Signature(s) Signed: 05/27/2023 4:35:54 PM By: Betha Loa Signed: 06/04/2023 8:06:11 AM By: Elliot Gurney, BSN, RN, CWS, Kim RN, BSN Entered By: Betha Loa on 05/22/2023 13:39:50 -------------------------------------------------------------------------------- Wound Assessment Details Patient Name: Date of Service: [facility], [name] 05/22/2023 1:00 PM Medical Record Number: [id number] Patient Account Number: [id number] Date of Birth/Sex: Treating RN: 1941/06/07 (82 y.o. [name] Primary Care Nazair Fortenberry: Barbette Reichmann Other Clinician: Betha Loa Referring Tymeir Weathington: Treating Cason Luffman/Extender: Adolph Pollack Weeks in Treatment: 34 Wound Status Wound Number: 13 Primary Pressure Ulcer Etiology: Wound Location: Left Calcaneus Wound Open Wounding Event: Pressure Injury Status: Date Acquired: 11/28/2022 Comorbid Cataracts, Lymphedema, Hypertension, Peripheral Arterial Disease, Weeks Of Treatment: 24 History: Peripheral Venous Disease, Type II Diabetes, Osteoarthritis, Clustered Wound: No Neuropathy Photos Wound Measurements Length: (cm) 1.5 [name], [name] ([id number]) Width: (cm) 2 Depth: (cm) 0.2 Area: (cm) 2.356 Volume: (cm) 0.471 % Reduction in Area: 31.8% [id number] Page 12 of 17 % Reduction in Volume: -36.1% Epithelialization: None Wound Description Classification: Category/Stage III Wound Margin: Flat and Intact Exudate Amount: Medium Exudate Type: Serosanguineous Exudate Color: red, brown Foul Odor After Cleansing: No Slough/Fibrino Yes Wound Bed Granulation Amount: Small (1-33%) Exposed Structure Granulation Quality: Pink Fascia Exposed: No Necrotic Amount: Large (67-100%) Fat Layer (Subcutaneous Tissue) Exposed: Yes Necrotic Quality: Adherent Slough Tendon Exposed: No Muscle Exposed: No Joint Exposed: No Bone Exposed: No Treatment Notes Wound #13 (Calcaneus) Wound  Laterality: Left Cleanser Soap and Water Discharge Instruction: Gently cleanse wound with antibacterial soap, rinse and pat dry prior to dressing wounds Vashe 5.8 (oz) Discharge Instruction: Use vashe 5.8 (oz) as directed Peri-Wound Care Topical Primary Dressing Gauze Discharge Instruction: vashe wet to dry Secondary Dressing Secured With Medipore T - 64M Medipore H Soft Cloth Surgical T ape ape, 2x2 (in/yd) Kerlix Roll Sterile or Non-Sterile 6-ply 4.5x4 (yd/yd) Discharge Instruction: Apply Kerlix as directed Compression Wrap Compression Stockings Add-Ons Electronic Signature(s) Signed: 05/27/2023 4:35:54 PM By: Betha Loa Signed: 06/04/2023 8:06:11 AM By: Elliot Gurney, BSN, RN, CWS, Kim RN, BSN Entered By: Betha Loa on 05/22/2023 13:53:38 -------------------------------------------------------------------------------- Wound Assessment Details Patient Name: Date of Service: [facility], [name] 05/22/2023 1:00 PM Medical Record Number: [id number] Patient Account Number: [id number] Date of Birth/Sex: Treating RN: Aug 06, 1941 (82 y.o. [name] [place], [place] [name] ([id number]) [id number] Page 13 of 17 Primary Care Docia Klar: Barbette Reichmann Other Clinician: Betha Loa Referring Ivory Maduro: Treating Zamaya Rapaport/Extender: Adolph Pollack Weeks in Treatment: 34 Wound Status Wound Number: 16 Primary Pressure Ulcer Etiology: Wound Location: Left Trochanter Wound Open Wounding Event: Pressure Injury Status: Date Acquired: 05/22/2023 Comorbid Cataracts, Lymphedema, Hypertension, Peripheral Arterial Disease, Weeks Of Treatment: 0 History: Peripheral Venous Disease, Type II Diabetes, Osteoarthritis, Clustered Wound: No Neuropathy Wound Measurements Length: (cm) Width: (cm)  Depth: (cm) Area: (cm) Volume: (cm) 1.8 % Reduction in Area: 2.3 % Reduction in Volume: 0.1 3.252 0.325 Wound Description Classification:  Category/Stage II Exudate Amount: Medium Exudate Type: Serosanguineous Exudate Color: red, brown Foul Odor After Cleansing: No Slough/Fibrino Yes Wound Bed Exposed Structure Fascia Exposed: No Fat Layer (Subcutaneous Tissue) Exposed: Yes Tendon Exposed: No Muscle Exposed: No Joint Exposed: No Bone Exposed: No Treatment Notes Wound #16 (Trochanter) Wound Laterality: Left Cleanser Soap and Water Discharge Instruction: Gently cleanse wound with antibacterial soap, rinse and pat dry prior to dressing wounds Vashe 5.8 (oz) Discharge Instruction: Use vashe 5.8 (oz) as directed Peri-Wound Care Topical Primary Dressing Gauze Discharge Instruction: Vashe wet to dry Secondary Dressing (BORDER) Zetuvit Plus SILICONE BORDER Dressing 5x5 (in/in) Discharge Instruction: Please do not put silicone bordered dressings under wraps. Use non-bordered dressing only. Secured With Compression Wrap Compression Stockings Facilities manager) Signed: 05/27/2023 4:35:54 PM By: Betha Loa Signed: 06/04/2023 8:06:11 AM By: Elliot Gurney, BSN, RN, CWS, Kim RN, BSN Entered By: Betha Loa on 05/22/2023 13:39:22 [name], [name] ([id number]) [id number] Page 14 of 17 -------------------------------------------------------------------------------- Wound Assessment Details Patient Name: Date of Service: [facility], [name] 05/22/2023 1:00 PM Medical Record Number: [id number] Patient Account Number: [id number] Date of Birth/Sex: Treating RN: 03/29/1941 (82 y.o. Cathlean Cower, Kim Primary Care Huxley Shurley: Barbette Reichmann Other Clinician: Betha Loa Referring Rasheka Denard: Treating Suleiman Finigan/Extender: Adolph Pollack Weeks in Treatment: 34 Wound Status Wound Number: 17 Primary Pressure Ulcer Etiology: Wound Location: Left, Anterior Lower Leg Wound Open Wounding Event: Pressure Injury Status: Date Acquired: 05/22/2023 Comorbid Cataracts, Lymphedema,  Hypertension, Peripheral Arterial Disease, Weeks Of Treatment: 0 History: Peripheral Venous Disease, Type II Diabetes, Osteoarthritis, Clustered Wound: No Neuropathy Wound Measurements Length: (cm) Width: (cm) Depth: (cm) Area: (cm) Volume: (cm) 8.5 % Reduction in Area: 5 % Reduction in Volume: 0.1 33.379 3.338 Wound Description Classification: Category/Stage I Exudate Amount: Medium Exudate Type: Serosanguineous Exudate Color: red, brown Treatment Notes Wound #17 (Lower Leg) Wound Laterality: Left, Anterior Cleanser Soap and Water Discharge Instruction: Gently cleanse wound with antibacterial soap, rinse and pat dry prior to dressing wounds Vashe 5.8 (oz) Discharge Instruction: Use vashe 5.8 (oz) as directed Peri-Wound Care Topical Primary Dressing Gauze Discharge Instruction: Vashe wet to dry Secondary Dressing Secured With Medipore T - 37M Medipore H Soft Cloth Surgical T ape ape, 2x2 (in/yd) Kerlix Roll Sterile or Non-Sterile 6-ply 4.5x4 (yd/yd) Discharge Instruction: Apply Kerlix as directed Compression Wrap Compression Stockings Add-Ons Electronic Signature(s) Signed: 05/27/2023 4:35:54 PM By: Danella Sensing, [name] ([id number]) [id number] Page 15 of 17 Signed: 06/04/2023 8:06:11 AM By: Elliot Gurney, BSN, RN, CWS, Kim RN, BSN Entered By: Betha Loa on 05/22/2023 13:42:10 -------------------------------------------------------------------------------- Wound Assessment Details Patient Name: Date of Service: [facility], [name] 05/22/2023 1:00 PM Medical Record Number: [id number] Patient Account Number: [id number] Date of Birth/Sex: Treating RN: 11/27/1940 (82 y.o. Cathlean Cower, Kim Primary Care Rifky Lapre: Barbette Reichmann Other Clinician: Betha Loa Referring Giacomo Valone: Treating Duffy Dantonio/Extender: Adolph Pollack Weeks in Treatment: 34 Wound Status Wound Number: 6 Primary Pressure Ulcer Etiology: Wound  Location: Right Calcaneus Wound Open Wounding Event: Pressure Injury Status: Date Acquired: 07/23/2022 Comorbid Cataracts, Lymphedema, Hypertension, Peripheral Arterial Disease, Weeks Of Treatment: 34 History: Peripheral Venous Disease, Type II Diabetes, Osteoarthritis, Clustered Wound: No Neuropathy Photos Wound Measurements Length: (cm) 4.5 Width: (cm) 5 Depth: (cm) 0.2 Area: (cm) 17.671 Volume: (cm) 3.534 % Reduction in Area: -87.5% % Reduction in Volume: -25% Epithelialization: None Wound Description Classification: Category/Stage III Wound  Margin: Distinct, outline attached Exudate Amount: Medium Exudate Type: Serosanguineous Exudate Color: red, brown Foul Odor After Cleansing: No Slough/Fibrino Yes Wound Bed Granulation Amount: Small (1-33%) Exposed Structure Granulation Quality: Pink Fascia Exposed: No Necrotic Amount: Large (67-100%) Fat Layer (Subcutaneous Tissue) Exposed: Yes Necrotic Quality: Adherent Slough Tendon Exposed: No Muscle Exposed: No Joint Exposed: No Bone Exposed: No Treatment Notes [name], [name] ([id number]) [id number] Page 16 of 17 Wound #6 (Calcaneus) Wound Laterality: Right Cleanser Byram Ancillary Kit - 15 Day Supply Discharge Instruction: Use supplies as instructed; Kit contains: (15) Saline Bullets; (15) 3x3 Gauze; 15 pr Gloves Soap and Water Discharge Instruction: Gently cleanse wound with antibacterial soap, rinse and pat dry prior to dressing wounds Vashe 5.8 (oz) Discharge Instruction: Use vashe 5.8 (oz) as directed Peri-Wound Care Topical Primary Dressing Gauze Discharge Instruction: vashe wet to dry Secondary Dressing Kerlix 4.5 x 4.1 (in/yd) Discharge Instruction: Apply Kerlix 4.5 x 4.1 (in/yd) as instructed Secured With Medipore T - 59M Medipore H Soft Cloth Surgical T ape ape, 2x2 (in/yd) Compression Wrap Compression Stockings Add-Ons Electronic Signature(s) Signed: 05/27/2023 4:35:54  PM By: Betha Loa Signed: 06/04/2023 8:06:11 AM By: Elliot Gurney, BSN, RN, CWS, Kim RN, BSN Entered By: Betha Loa on 05/22/2023 13:55:17 -------------------------------------------------------------------------------- Wound Assessment Details Patient Name: Date of Service: [facility], [name] 05/22/2023 1:00 PM Medical Record Number: [id number] Patient Account Number: [id number] Date of Birth/Sex: Treating RN: 12-09-1940 (82 y.o. Cathlean Cower, Kim Primary Care Lashannon Bresnan: Barbette Reichmann Other Clinician: Betha Loa Referring Corney Knighton: Treating Kelita Wallis/Extender: Adolph Pollack Weeks in Treatment: 34 Wound Status Wound Number: 8 Primary Pressure Ulcer Etiology: Wound Location: Midline Sacrum Wound Healed - Epithelialized Wounding Event: Pressure Injury Status: Date Acquired: 07/23/2022 Comorbid Cataracts, Lymphedema, Hypertension, Peripheral Arterial Disease, Weeks Of Treatment: 34 History: Peripheral Venous Disease, Type II Diabetes, Osteoarthritis, Clustered Wound: No Neuropathy Wound Measurements Length: (cm) Width: (cm) Depth: (cm) Area: (cm) Volume: (cm) 0 % Reduction in Area: 100% 0 % Reduction in Volume: 100% 0 Epithelialization: Large (67-100%) 0 0 Wound Description Hedding, Shaleka [name] ([id number]) Classification: Category/Stage III Wound Margin: Distinct, outline attached Exudate Amount: None Present [id number] Page 17 of 17 Foul Odor After Cleansing: No Slough/Fibrino No Wound Bed Granulation Amount: None Present (0%) Exposed Structure Necrotic Amount: Small (1-33%) Fascia Exposed: No Fat Layer (Subcutaneous Tissue) Exposed: Yes Tendon Exposed: No Muscle Exposed: No Joint Exposed: No Bone Exposed: No Treatment Notes Wound #8 (Sacrum) Wound Laterality: Midline Cleanser Peri-Wound Care Topical Primary Dressing Secondary Dressing Secured With Compression Wrap Compression Stockings Add-Ons Electronic  Signature(s) Signed: 05/27/2023 4:35:54 PM By: Betha Loa Signed: 06/04/2023 8:06:11 AM By: Elliot Gurney, BSN, RN, CWS, Kim RN, BSN Entered By: Betha Loa on 05/22/2023 14:02:11 -------------------------------------------------------------------------------- Vitals Details Patient Name: Date of Service: [facility], [name] 05/22/2023 1:00 PM Medical Record Number: [id number] Patient Account Number: [id number] Date of Birth/Sex: Treating RN: October 18, 1941 (82 y.o. Cathlean Cower, Kim Primary Care Chaunda Vandergriff: Barbette Reichmann Other Clinician: Betha Loa Referring Miloh Alcocer: Treating Leviticus Harton/Extender: Adolph Pollack Weeks in Treatment: 44 Vital Signs Time Taken: 13:08 Temperature (F): 98.2 Weight (lbs): 116 Pulse (bpm): 91 Respiratory Rate (breaths/min): 16 Blood Pressure (mmHg): 89/54 Reference Range: 80 - 120 mg / dl Electronic Signature(s) Signed: 05/27/2023 4:35:54 PM By: Betha Loa Entered By: Betha Loa on 05/22/2023 13:11:01

## 2023-06-04 NOTE — Progress Notes (Signed)
[name], [name] ([id number]) [id number] Page 1 of 13 Visit Report for 05/22/2023 Chief Complaint Document Details Patient Name: Date of Service: [facility], [name]. 05/22/2023 1:00 PM Medical Record Number: [id number] Patient Account Number: [id number] Date of Birth/Sex: Treating RN: [birth year] (82 y.o. [name] Primary Care Provider: Barbette Reichmann Other Clinician: Betha Loa Referring Provider: Treating Provider/Extender: Adolph Pollack Weeks in Treatment: 52 Information Obtained from: Patient Chief Complaint Right heel wound, right hip wound, right ankle wound, left ankle wound, right lower extremity wound, left anterior lower extremity wound, left hip wound Electronic Signature(s) Signed: 05/22/2023 5:05:21 PM By: Geralyn Corwin DO Entered By: Geralyn Corwin on 05/22/2023 16:26:55 -------------------------------------------------------------------------------- HPI Details Patient Name: Date of Service: [facility], [name] 05/22/2023 1:00 PM Medical Record Number: [id number] Patient Account Number: [id number] Date of Birth/Sex: Treating RN: 06-May-1941 (82 y.o. [name] Primary Care Provider: Barbette Reichmann Other Clinician: Betha Loa Referring Provider: Treating Provider/Extender: Adolph Pollack Weeks in Treatment: 36 History of Present Illness HPI Description: 82 year old patient who comes with a referral for bilateral lower extremity edema and a lower extremity ulceration and has been sent by her PCP [name]. I understand the patient was recently put on amoxicillin and doxycycline but could not tolerate the amoxicillin. doxycycline course was completed. a BNP and EKG was supposed to be normal and the patient did not have any dyspnea. the patient has been on a diuretic. The patient was also prescribed a pair of elastic compression stockings of the 20-30 mmHg pressure  variety. x-ray of the right ankle was done on 09/20/2015 and showed posttraumatic and postsurgical changes of the right ankle with secondary degenerative changes of the tibiotalar joint and to a lesser degree the subtalar joint. No definite acute bony abnormalities are noted. Past medical history significant for diabetes mellitus, hypertension, hyperlipidemia, right breast cancer treated with a mastectomy in 2014. She has never smoked. 10/24/2015 -- she had delayed her vascular test because of her husband surgery but she is now ready to get him taken care of. He is also unable to use compression stockings and hence we will need to order her Juzo wraps. 10/31/2015-- was seen by Dr. Wyn Quaker on 10/28/2015. She had a left lower extremity arterial duplex done at his office a couple of years ago and that was essentially normal. Today they performed a venous duplex which revealed no evidence of deep vein thrombosis, superficial thrombophlebitis, no venous reflex was seen on the right and a minimal amount of reflux was seen on the left great saphenous vein but no significant reflux was seen. Impression was that there was a component of lymphedema present from a previous surgery and he would recommend compression stockings and leg elevation. [name], [name] ([id number]) [id number] Page 2 of 13 Readmission: 07-24-2022 upon evaluation today patient presents for initial inspection here in our clinic concerning issues that she has been having with her legs this is actually been going on for several years according to what her family member with her today tells me as well as what the patient reiterates as well. She is currently most recently been seeing Dr. Ether Griffins and subsequently he had her in [employer] boots. However 2 weeks ago he referred her to [place] and then subsequently took her out of the [employer] boot wraps at that point. At this time the left leg looks to be worse in the right leg  currently. She is on Lasix and lisinopril with hydrochlorothiazide she has  high blood pressure she also has issues currently with lower extremity swelling and edema which has been an ongoing issue for her as well. Patient does have a history of chronic venous hypertension, lymphedema, diabetes mellitus type 2, hypertension, peripheral vascular disease, and neuropathy. Currently she is on Lasix as well as lisinopril with hydrochlorothiazide. 08-02-2022 upon evaluation today patient presents for reevaluation the good news is she is actually doing somewhat better in regard to the wound and the overall appearance and sinuses. The unfortunate thing is her infection really is not significantly improved we did have to switch out her antibiotic once we got that final result back and I switched her to Levaquin and away from the doxycycline. Unfortunately the doxycycline had been doing poorly for her. In fact she had had diarrhea from the time she started taking it on Friday and she is still having it when she shows up today for evaluation. Again I was not aware of this and obviously she does appear to be somewhat dehydrated as well based on what I see. My concern which I discussed with the patient today is the possibility of a C. difficile infection. With that being said fact this started immediately upon taking the doxycycline makes me think that it was just the medicine and is not completely out of her system yet despite having taken the last dose Tuesday morning. Nonetheless with what we are seeing currently I want to be sure that reason I Minna contact her primary care provider and see if a would be willing to see her and test for C. difficile infection. 08-07-2022 upon evaluation today patient appears to be doing well currently in regard to her wounds which are actually measuring much better this is great news. Fortunately I do not see any signs of active infection locally or systemically at this time which  is great as well. The good news is she was tested for a C. difficile infection and it was negative. I am very pleased and thankful for primary care provider for doing that so this means that she was just having a severe reaction to the doxycycline we have added that to her allergy list at this point. 08-14-2022 upon evaluation today patient appears to be doing well currently in regard to her dehydration she is actually significantly improved compared to last time I saw her last week. With that being said I do not see any evidence of active infection systemically at this time which is great news. However locally she still does appear to have cellulitis in left lower extremity. I discussed with her today that I do believe she would benefit from going ahead and starting the Levaquin. Again we will concerned about the C. difficile infection of the diarrhea but that this turned out to be just an issue with a reaction to the doxycycline. My hope is that the Levaquin will not cause her any complication and will be able to treat the infection. I did review her arterial study as well which was dated on 07-31-2022. It showed that she had a ABI on the right of 1.30 and on the left of 1.27 with a TBI on the right of 1.12 and the left 1.21 this is a normal arterial study. Again on the patient's wound culture she actually did show evidence of Proteus, Morganella, and MRSA. Levaquin is a good option here across the board. 09/26/2022 [name] is a 82 year old female with a past medical history of type 2 diabetes currently controlled on  oral agents, venous insufficiency/lymphedema, right breast cancer and chronic diastolic heart failure that presents to the clinic for a 1 month history of nonhealing wounds to the left lower extremity, right heel and left buttocks. She states that the buttocks wound and the right heel wound developed while she was in the hospital. She was admitted on 08/22/2022 for severe  sepsis secondary to acute sigmoid and distal colonic diverticulitis. At that time it was noted she had a stage I decubitus ulcer to her bilateral buttocks. A wound to the heel was not mentioned. She currently denies systemic signs of infection. She came into clinic in a blue gown with no undergarments. She has not been dressing the wounds. She states she just recently obtained home health and they are coming out for the first time this week. She is seen in our clinic often for lower extremity wounds secondary to venous insufficiency. 11/22; patient presents for follow-up. Son is present during the encounter. Per son it sounds like they are not doing any dressing changes. She states she has home health but they have not come out. She is going to let us know which home health agency she has been approved for so we can send orders. Currently she denies signs of infection. 12/13; patient presents for follow-up. Patient has home health and they are coming out once a week. It appears that there has not been any dressing changes except for with home health. Patient has a newly discovered wound to the left foot. There is exposed bone. There is slight erythema and increased warmth to the surrounding tissue. Patient is completely unaware of this wound. 12/20; patient presents for follow-up. She has been taking her oral antibiotics. She now has an eschar and wound to the right hip. She has been using Dakin's wet-to-dry dressings to the left foot wound and buttocks wound. She has been using Medihoney and silver alginate to the right heel wound. She currently denies systemic signs of infection. She is mainly bedbound or in a wheelchair. 1/10; patient presents for follow-up. Patient had her left second toenail removed by Dr. Ether Griffins, podiatry on 12/21. Unfortunately she did not feel well and she was admitted to the hospital for sepsis. Her right heel wound was thought to be infected and this was debrided in the OR by  Dr. Ether Griffins. Culture and bone biopsy had no growth. She has been using Medihoney to all the wound beds except for the lefty buttocks wound for which she uses Dakin's wet-to-dry dressings. She has developed a pressure injury to the left heel now. This is despite having Prevalon boots. Although she is not wearing them today. She has completed 4 weeks of Augmentin and also a week of doxycycline for osteomyelitis of the left foot. Her left foot wound no longer probes to bone. Currently she denies signs of infection. 1/24; patient presents for follow-up. She has been using Medihoney and Hydrofera Blue to the wound beds except for this buttocks wound she has been using Dakin's wet-to-dry dressings. She has developed 2 new wounds 1 to the left heel and the other to the right medial ankle. She claims she is wearing her Prevalon boots all the time although she does not have them on today. She currently denies signs of infection. 2/21; patient presents for follow-up. She was recently hospitalized on 12/19/2022 for sepsis secondary to Enterococcus bacillus bacteremia. She was treated with IV antibiotics and has completed her discharge oral antibiotics. She had a CT scanning of the abdomen/pelvis without  acute abnormalities. Husband is present with patient today. They have been using Dakin's wet-to-dry dressings to the sacral wound and hip wound and Hydrofera Blue and Medihoney to the feet wounds. They are not changing the dressings daily. It is unclear how often they actually change the dressings. She has been wearing her Prevalon boots at night but not during the day. She currently denies systemic signs of infection. 3/20; patient presents for follow-up. She again was in the hospital for severe sepsis on 2/24; MRIs at that time showed findings suspicious for osteomyelitis to the right hip. She is currently on Augmentin to complete 4 weeks of this. She is following with ID. She has been using Dakin's wet-to-dry  dressings to the sacrum and right hip however the solution is starting to cause discomfort. T the rest of the wound she has been using Hydrofera Blue and Medihoney. Patient o and son are not interested in doing palliative care or hospice. They state that they had discussions while in the hospital. 4/17; patient presents for follow-up. She has been using Vashe wet-to-dry dressings to the sacrum and right trochanter wound. She has been using Medihoney and Hydrofera Blue to the bilateral feet wounds. She has no issues or complaints today. She denies signs of infection. 5/1; patient developed a new wound to the right leg. Son was present and states that there was a scab and he removed it creating a wound. He has been keeping the area covered. T the sacral and right trochanter wound they have been using Vashe wet-to-dry dressings and Medihoney and Hydrofera Blue to the o bilateral feet wounds. She denies signs of infection. 6/1; 1 month follow-up. She has a new area on the left anterior lower leg. In the meantime the sacral ulcer is closed. She still has an extensive wound on the right hip with exposed tendon over the greater trochanter. On the lower extremities the left anterior lower leg wound is new right ankle left heel and right heel all look reasonably stable. We are using Vashe wet-to-dry on the hip Medihoney and Hydrofera Blue on the lower extremities. She is accompanied by her son I think who is doing the dressings. They do not have a pressure-relief surface at home but they do have a hospital bed [facility] [name] ([id number]) [id number] Page 3 of 13 7/10; I have not seen this patient since 03/13/2023. She was scheduled to be seen on 5/29 however missed her appointment due to illness. She was seen on 6/5 by Dr. Leanord Hawking and she presents today for her follow-up. Unfortunately her wounds have declined since I last saw her. She has several new wounds including one to the  left hip wound and one left anterior lower leg. The right hip wound has declined and has maggots with bone exposed. She has been using Vashe wet- to-dry dressings to Right hip wound and Medihoney and Hydrofera Blue to the The remaining wounds. Sacral wound remains closed. Electronic Signature(s) Signed: 05/22/2023 5:05:21 PM By: Geralyn Corwin DO Entered By: Geralyn Corwin on 05/22/2023 16:44:04 -------------------------------------------------------------------------------- Physical Exam Details Patient Name: Date of Service: [facility], [name] 05/22/2023 1:00 PM Medical Record Number: [id number] Patient Account Number: [id number] Date of Birth/Sex: Treating RN: [birth year] (82 y.o. [name] Primary Care Provider: Barbette Reichmann Other Clinician: Betha Loa Referring Provider: Treating Provider/Extender: Adolph Pollack Weeks in Treatment: 34 Constitutional . Cardiovascular . Psychiatric . Notes T the right There is an open wound that probes straight to bone with maggots  present. T the lower extremities and feet bilaterally there are scattered open o o wounds with granulation tissue and nonviable tissue. No obvious soft tissue infection to the lower extremities. Electronic Signature(s) Signed: 05/22/2023 5:05:21 PM By: Geralyn Corwin DO Entered By: Geralyn Corwin on 05/22/2023 16:46:05 -------------------------------------------------------------------------------- Physician Orders Details Patient Name: Date of Service: [facility], [name] 05/22/2023 1:00 PM Medical Record Number: [id number] Patient Account Number: [id number] Date of Birth/Sex: Treating RN: October 16, 1941 (82 y.o. [name] Primary Care Provider: Barbette Reichmann Other Clinician: Betha Loa Referring Provider: Treating Provider/Extender: Adolph Pollack Weeks in Treatment: 34 Verbal / Phone Orders: Yes Clinician: Huel Coventry Read Back and  Verified: Yes Diagnosis Coding [name], [name] ([id number]) [id number] Page 4 of 13 ICD-10 Coding Code Description [phone] Non-pressure chronic ulcer of other part of left lower leg with fat layer exposed L97.812 Non-pressure chronic ulcer of other part of right lower leg with fat layer exposed I87.312 Chronic venous hypertension (idiopathic) with ulcer of left lower extremity I89.0 Lymphedema, not elsewhere classified E11.622 Type 2 diabetes mellitus with other skin ulcer I50.32 Chronic diastolic (congestive) heart failure L89.320 Pressure ulcer of left buttock, unstageable L89.610 Pressure ulcer of right heel, unstageable S91.302A Unspecified open wound, left foot, initial encounter M86.172 Other acute osteomyelitis, left ankle and foot L89.210 Pressure ulcer of right hip, unstageable L89.513 Pressure ulcer of right ankle, stage 3 L89.620 Pressure ulcer of left heel, unstageable L89.220 Pressure ulcer of left hip, unstageable Z79.84 Long term (current) use of oral hypoglycemic drugs S81.802A Unspecified open wound, left lower leg, initial encounter Follow-up Appointments Return Appointment in 2 weeks. Bathing/ [employer] wounds with antibacterial soap and water. No tub bath. Anesthetic (Use 'Patient Medications' Section for Anesthetic Order Entry) Lidocaine applied to wound bed Off-Loading Other: - wear prevalon heel boots at all times Wound Treatment Wound #11 - Trochanter Wound Laterality: Right Cleanser: Soap and Water 1 x Per Day/30 Days Discharge Instructions: Gently cleanse wound with antibacterial soap, rinse and pat dry prior to dressing wounds Cleanser: Vashe 5.8 (oz) 1 x Per Day/30 Days Discharge Instructions: Use vashe 5.8 (oz) as directed Prim Dressing: Gauze (Generic) 1 x Per Day/30 Days ary Discharge Instructions: Vashe wet to dry Secondary Dressing: (BORDER) Zetuvit Plus SILICONE BORDER Dressing 5x5 (in/in) (Dispense As  Written) 1 x Per Day/30 Days Discharge Instructions: Please do not put silicone bordered dressings under wraps. Use non-bordered dressing only. Wound #12 - Ankle Wound Laterality: Right, Medial Cleanser: Soap and Water 1 x Per Day/30 Days Discharge Instructions: Gently cleanse wound with antibacterial soap, rinse and pat dry prior to dressing wounds Cleanser: Vashe 5.8 (oz) 1 x Per Day/30 Days Discharge Instructions: Use vashe 5.8 (oz) as directed Prim Dressing: Gauze 1 x Per Day/30 Days ary Discharge Instructions: Vashe wet to dry Secured With: Medipore T - 54M Medipore H Soft Cloth Surgical T ape ape, 2x2 (in/yd) (Generic) 1 x Per Day/30 Days Secured With: Kerlix Roll Sterile or Non-Sterile 6-ply 4.5x4 (yd/yd) (Generic) 1 x Per Day/30 Days Discharge Instructions: Apply Kerlix as directed Wound #13 - Calcaneus Wound Laterality: Left Cleanser: Soap and Water 1 x Per Day/30 Days Discharge Instructions: Gently cleanse wound with antibacterial soap, rinse and pat dry prior to dressing wounds Cleanser: Vashe 5.8 (oz) 1 x Per Day/30 Days Discharge Instructions: Use vashe 5.8 (oz) as directed Prim Dressing: Gauze 1 x Per Day/30 Days ary Discharge Instructions: vashe wet to dry Schlicker, Naylee [name] ([id number]) [id number] Page 5 of 13 Secured With:  Medipore T - 82M Medipore H Soft Cloth Surgical T ape ape, 2x2 (in/yd) (Generic) 1 x Per Day/30 Days Secured With: Kerlix Roll Sterile or Non-Sterile 6-ply 4.5x4 (yd/yd) (Generic) 1 x Per Day/30 Days Discharge Instructions: Apply Kerlix as directed Wound #16 - Trochanter Wound Laterality: Left Cleanser: Soap and Water 1 x Per Day/30 Days Discharge Instructions: Gently cleanse wound with antibacterial soap, rinse and pat dry prior to dressing wounds Cleanser: Vashe 5.8 (oz) 1 x Per Day/30 Days Discharge Instructions: Use vashe 5.8 (oz) as directed Prim Dressing: Gauze (Generic) 1 x Per Day/30 Days ary Discharge  Instructions: Vashe wet to dry Secondary Dressing: (BORDER) Zetuvit Plus SILICONE BORDER Dressing 5x5 (in/in) (Dispense As Written) 1 x Per Day/30 Days Discharge Instructions: Please do not put silicone bordered dressings under wraps. Use non-bordered dressing only. Wound #17 - Lower Leg Wound Laterality: Left, Anterior Cleanser: Soap and Water 1 x Per Day/30 Days Discharge Instructions: Gently cleanse wound with antibacterial soap, rinse and pat dry prior to dressing wounds Cleanser: Vashe 5.8 (oz) 1 x Per Day/30 Days Discharge Instructions: Use vashe 5.8 (oz) as directed Prim Dressing: Gauze 1 x Per Day/30 Days ary Discharge Instructions: Vashe wet to dry Secured With: Medipore T - 82M Medipore H Soft Cloth Surgical T ape ape, 2x2 (in/yd) (Generic) 1 x Per Day/30 Days Secured With: Kerlix Roll Sterile or Non-Sterile 6-ply 4.5x4 (yd/yd) (Generic) 1 x Per Day/30 Days Discharge Instructions: Apply Kerlix as directed Wound #6 - Calcaneus Wound Laterality: Right Cleanser: [employer]ram Ancillary Kit - 15 Day Supply (Generic) 3 x Per Week/30 Days Discharge Instructions: Use supplies as instructed; Kit contains: (15) Saline Bullets; (15) 3x3 Gauze; 15 pr Gloves Cleanser: Soap and Water 3 x Per Week/30 Days Discharge Instructions: Gently cleanse wound with antibacterial soap, rinse and pat dry prior to dressing wounds Cleanser: Vashe 5.8 (oz) 3 x Per Week/30 Days Discharge Instructions: Use vashe 5.8 (oz) as directed Prim Dressing: Gauze 3 x Per Week/30 Days ary Discharge Instructions: vashe wet to dry Secondary Dressing: Kerlix 4.5 x 4.1 (in/yd) (Generic) 3 x Per Week/30 Days Discharge Instructions: Apply Kerlix 4.5 x 4.1 (in/yd) as instructed Secured With: Medipore T - 82M Medipore H Soft Cloth Surgical T ape ape, 2x2 (in/yd) (Generic) 3 x Per Week/30 Days Electronic Signature(s) Signed: 05/27/2023 4:35:54 PM By: Betha Loa Signed: 05/30/2023 1:03:58 PM By: Geralyn Corwin DO Previous Signature:  05/22/2023 5:05:21 PM Version By: Geralyn Corwin DO Entered By: Betha Loa on 05/23/2023 08:25:29 -------------------------------------------------------------------------------- Problem List Details Patient Name: Date of Service: [facility], [name] 05/22/2023 1:00 PM Kropf, [name] ([id number]) [id number] Page 6 of 13 Medical Record Number: [id number] Patient Account Number: [id number] Date of Birth/Sex: Treating RN: February 23, 1941 (82 y.o. Cathlean Cower, Kim Primary Care Provider: Barbette Reichmann Other Clinician: Betha Loa Referring Provider: Treating Provider/Extender: Adolph Pollack Weeks in Treatment: 28 Active Problems ICD-10 Encounter Code Description Active Date MDM Diagnosis [phone] Non-pressure chronic ulcer of other part of left lower leg with fat layer 09/26/2022 No Yes exposed L97.812 Non-pressure chronic ulcer of other part of right lower leg with fat layer 03/13/2023 No Yes exposed I87.312 Chronic venous hypertension (idiopathic) with ulcer of left lower extremity 09/26/2022 No Yes I89.0 Lymphedema, not elsewhere classified 09/26/2022 No Yes E11.622 Type 2 diabetes mellitus with other skin ulcer 09/26/2022 No Yes I50.32 Chronic diastolic (congestive) heart failure 09/26/2022 No Yes L89.320 Pressure ulcer of left buttock, unstageable 09/26/2022 No Yes L89.610 Pressure ulcer of right heel, unstageable 09/26/2022 No  Yes S91.302A Unspecified open wound, left foot, initial encounter 10/24/2022 No Yes M86.172 Other acute osteomyelitis, left ankle and foot 10/24/2022 No Yes L89.210 Pressure ulcer of right hip, unstageable 11/21/2022 No Yes L89.513 Pressure ulcer of right ankle, stage 3 12/05/2022 No Yes L89.620 Pressure ulcer of left heel, unstageable 12/05/2022 No Yes L89.220 Pressure ulcer of left hip, unstageable 05/22/2023 No Yes S81.802A Unspecified open wound, left lower leg, initial encounter 05/22/2023 No  Yes Z79.84 Long term (current) use of oral hypoglycemic drugs 03/13/2023 No Yes Faughn, Demiyah [name] ([id number]) [id number] Page 7 of 13 Inactive Problems Resolved Problems Electronic Signature(s) Signed: 05/22/2023 5:05:21 PM By: Geralyn Corwin DO Entered By: Geralyn Corwin on 05/22/2023 16:24:08 -------------------------------------------------------------------------------- Progress Note Details Patient Name: Date of Service: [facility], [name] 05/22/2023 1:00 PM Medical Record Number: [id number] Patient Account Number: [id number] Date of Birth/Sex: Treating RN: 10/31/1941 (82 y.o. [name] Primary Care Provider: Barbette Reichmann Other Clinician: Betha Loa Referring Provider: Treating Provider/Extender: Adolph Pollack Weeks in Treatment: 55 Subjective Chief Complaint Information obtained from Patient Right heel wound, right hip wound, right ankle wound, left ankle wound, right lower extremity wound, left anterior lower extremity wound, left hip wound History of Present Illness (HPI) 82 year old patient who comes with a referral for bilateral lower extremity edema and a lower extremity ulceration and has been sent by her PCP [name]. I understand the patient was recently put on amoxicillin and doxycycline but could not tolerate the amoxicillin. doxycycline course was completed. a BNP and EKG was supposed to be normal and the patient did not have any dyspnea. the patient has been on a diuretic. The patient was also prescribed a pair of elastic compression stockings of the 20-30 mmHg pressure variety. x-ray of the right ankle was done on 09/20/2015 and showed posttraumatic and postsurgical changes of the right ankle with secondary degenerative changes of the tibiotalar joint and to a lesser degree the subtalar joint. No definite acute bony abnormalities are noted. Past medical history significant for diabetes  mellitus, hypertension, hyperlipidemia, right breast cancer treated with a mastectomy in 2014. She has never smoked. 10/24/2015 -- she had delayed her vascular test because of her husband surgery but she is now ready to get him taken care of. He is also unable to use compression stockings and hence we will need to order her Juzo wraps. 10/31/2015-- was seen by Dr. Wyn Quaker on 10/28/2015. She had a left lower extremity arterial duplex done at his office a couple of years ago and that was essentially normal. Today they performed a venous duplex which revealed no evidence of deep vein thrombosis, superficial thrombophlebitis, no venous reflex was seen on the right and a minimal amount of reflux was seen on the left great saphenous vein but no significant reflux was seen. Impression was that there was a component of lymphedema present from a previous surgery and he would recommend compression stockings and leg elevation. Readmission: 07-24-2022 upon evaluation today patient presents for initial inspection here in our clinic concerning issues that she has been having with her legs this is actually been going on for several years according to what her family member with her today tells me as well as what the patient reiterates as well. She is currently most recently been seeing Dr. Ether Griffins and subsequently he had her in [place] boots. However 2 weeks ago he referred her to [place] and then subsequently took her out of the [place] boot wraps at that point. At this  time the left leg looks to be worse in the right leg currently. She is on Lasix and lisinopril with hydrochlorothiazide she has high blood pressure she also has issues currently with lower extremity swelling and edema which has been an ongoing issue for her as well. Patient does have a history of chronic venous hypertension, lymphedema, diabetes mellitus type 2, hypertension, peripheral vascular disease, and neuropathy. Currently she is on Lasix as well as  lisinopril with hydrochlorothiazide. 08-02-2022 upon evaluation today patient presents for reevaluation the good news is she is actually doing somewhat better in regard to the wound and the overall appearance and sinuses. The unfortunate thing is her infection really is not significantly improved we did have to switch out her antibiotic once we got that final result back and I switched her to Levaquin and away from the doxycycline. Unfortunately the doxycycline had been doing poorly for her. In fact she had had diarrhea from the time she started taking it on Friday and she is still having it when she shows up today for evaluation. Again I was not aware of this and obviously she does appear to be somewhat dehydrated as well based on what I see. My concern which I discussed with the patient today is the possibility of a C. difficile infection. With that being said fact this started immediately upon taking the doxycycline makes me think that it was just the medicine and is not completely out of her system yet despite having taken the last dose Tuesday morning. Nonetheless with what we are seeing currently I want to be sure that reason I Minna contact her primary care provider and see if a would be willing to see her and test for C. difficile infection. 08-07-2022 upon evaluation today patient appears to be doing well currently in regard to her wounds which are actually measuring much better this is great news. Fortunately I do not see any signs of active infection locally or systemically at this time which is great as well. The good news is she was tested for a C. difficile infection and it was negative. I am very pleased and thankful for primary care provider for doing that so this means that she was just having a severe reaction to the doxycycline we have added that to her allergy list at this point. [name], [name] ([id number]) [id number] Page 8 of 13 08-14-2022 upon  evaluation today patient appears to be doing well currently in regard to her dehydration she is actually significantly improved compared to last time I saw her last week. With that being said I do not see any evidence of active infection systemically at this time which is great news. However locally she still does appear to have cellulitis in left lower extremity. I discussed with her today that I do believe she would benefit from going ahead and starting the Levaquin. Again we will concerned about the C. difficile infection of the diarrhea but that this turned out to be just an issue with a reaction to the doxycycline. My hope is that the Levaquin will not cause her any complication and will be able to treat the infection. I did review her arterial study as well which was dated on 07-31-2022. It showed that she had a ABI on the right of 1.30 and on the left of 1.27 with a TBI on the right of 1.12 and the left 1.21 this is a normal arterial study. Again on the patient's wound culture she actually did show evidence of  Proteus, Morganella, and MRSA. Levaquin is a good option here across the board. 09/26/2022 [name] is a 82 year old female with a past medical history of type 2 diabetes currently controlled on oral agents, venous insufficiency/lymphedema, right breast cancer and chronic diastolic heart failure that presents to the clinic for a 1 month history of nonhealing wounds to the left lower extremity, right heel and left buttocks. She states that the buttocks wound and the right heel wound developed while she was in the hospital. She was admitted on 08/22/2022 for severe sepsis secondary to acute sigmoid and distal colonic diverticulitis. At that time it was noted she had a stage I decubitus ulcer to her bilateral buttocks. A wound to the heel was not mentioned. She currently denies systemic signs of infection. She came into clinic in a blue gown with no undergarments. She has not been  dressing the wounds. She states she just recently obtained home health and they are coming out for the first time this week. She is seen in our clinic often for lower extremity wounds secondary to venous insufficiency. 11/22; patient presents for follow-up. Son is present during the encounter. Per son it sounds like they are not doing any dressing changes. She states she has home health but they have not come out. She is going to let us know which home health agency she has been approved for so we can send orders. Currently she denies signs of infection. 12/13; patient presents for follow-up. Patient has home health and they are coming out once a week. It appears that there has not been any dressing changes except for with home health. Patient has a newly discovered wound to the left foot. There is exposed bone. There is slight erythema and increased warmth to the surrounding tissue. Patient is completely unaware of this wound. 12/20; patient presents for follow-up. She has been taking her oral antibiotics. She now has an eschar and wound to the right hip. She has been using Dakin's wet-to-dry dressings to the left foot wound and buttocks wound. She has been using Medihoney and silver alginate to the right heel wound. She currently denies systemic signs of infection. She is mainly bedbound or in a wheelchair. 1/10; patient presents for follow-up. Patient had her left second toenail removed by Dr. Ether Griffins, podiatry on 12/21. Unfortunately she did not feel well and she was admitted to the hospital for sepsis. Her right heel wound was thought to be infected and this was debrided in the OR by Dr. Ether Griffins. Culture and bone biopsy had no growth. She has been using Medihoney to all the wound beds except for the lefty buttocks wound for which she uses Dakin's wet-to-dry dressings. She has developed a pressure injury to the left heel now. This is despite having Prevalon boots. Although she is not wearing them  today. She has completed 4 weeks of Augmentin and also a week of doxycycline for osteomyelitis of the left foot. Her left foot wound no longer probes to bone. Currently she denies signs of infection. 1/24; patient presents for follow-up. She has been using Medihoney and Hydrofera Blue to the wound beds except for this buttocks wound she has been using Dakin's wet-to-dry dressings. She has developed 2 new wounds 1 to the left heel and the other to the right medial ankle. She claims she is wearing her Prevalon boots all the time although she does not have them on today. She currently denies signs of infection. 2/21; patient presents for follow-up. She was  recently hospitalized on 12/19/2022 for sepsis secondary to Enterococcus bacillus bacteremia. She was treated with IV antibiotics and has completed her discharge oral antibiotics. She had a CT scanning of the abdomen/pelvis without acute abnormalities. Husband is present with patient today. They have been using Dakin's wet-to-dry dressings to the sacral wound and hip wound and Hydrofera Blue and Medihoney to the feet wounds. They are not changing the dressings daily. It is unclear how often they actually change the dressings. She has been wearing her Prevalon boots at night but not during the day. She currently denies systemic signs of infection. 3/20; patient presents for follow-up. She again was in the hospital for severe sepsis on 2/24; MRIs at that time showed findings suspicious for osteomyelitis to the right hip. She is currently on Augmentin to complete 4 weeks of this. She is following with ID. She has been using Dakin's wet-to-dry dressings to the sacrum and right hip however the solution is starting to cause discomfort. T the rest of the wound she has been using Hydrofera Blue and Medihoney. Patient o and son are not interested in doing palliative care or hospice. They state that they had discussions while in the hospital. 4/17; patient  presents for follow-up. She has been using Vashe wet-to-dry dressings to the sacrum and right trochanter wound. She has been using Medihoney and Hydrofera Blue to the bilateral feet wounds. She has no issues or complaints today. She denies signs of infection. 5/1; patient developed a new wound to the right leg. Son was present and states that there was a scab and he removed it creating a wound. He has been keeping the area covered. T the sacral and right trochanter wound they have been using Vashe wet-to-dry dressings and Medihoney and Hydrofera Blue to the o bilateral feet wounds. She denies signs of infection. 6/1; 1 month follow-up. She has a new area on the left anterior lower leg. In the meantime the sacral ulcer is closed. She still has an extensive wound on the right hip with exposed tendon over the greater trochanter. On the lower extremities the left anterior lower leg wound is new right ankle left heel and right heel all look reasonably stable. We are using Vashe wet-to-dry on the hip Medihoney and Hydrofera Blue on the lower extremities. She is accompanied by her son I think who is doing the dressings. They do not have a pressure-relief surface at home but they do have a hospital bed 7/10; I have not seen this patient since 03/13/2023. She was scheduled to be seen on 5/29 however missed her appointment due to illness. She was seen on 6/5 by Dr. Leanord Hawking and she presents today for her follow-up. Unfortunately her wounds have declined since I last saw her. She has several new wounds including one to the left hip wound and one left anterior lower leg. The right hip wound has declined and has maggots with bone exposed. She has been using Vashe wet- to-dry dressings to Right hip wound and Medihoney and Hydrofera Blue to the The remaining wounds. Sacral wound remains closed. Objective Constitutional Vitals Time Taken: 1:08 PM, Weight: 116 lbs, Temperature: 98.2 F, Pulse: 91 bpm, Respiratory Rate:  16 breaths/min, Blood Pressure: 89/54 mmHg. General Notes: T the right There is an open wound that probes straight to bone with maggots present. T the lower extremities and feet bilaterally there are o o scattered open wounds with granulation tissue and nonviable tissue. No obvious soft tissue infection to the lower extremities. Integumentary (  Hair, Skin) Wound #11 status is Open. Original cause of wound was Pressure Injury. The date acquired was: 10/24/2022. The wound has been in treatment 29 weeks. The ANAYSIA, GERMER ([id number]) [id number] Page 9 of 13 wound is located on the Right Trochanter. The wound measures 3.5cm length x 4cm width x 1.6cm depth; 10.996cm^2 area and 17.593cm^3 volume. There is muscle, tendon, Fat Layer (Subcutaneous Tissue), and fascia exposed. There is a medium amount of serosanguineous drainage noted. The wound margin is well defined and not attached to the wound base. There is medium (34-66%) red granulation within the wound bed. There is a medium (34-66%) amount of necrotic tissue within the wound bed including Adherent Slough. Wound #12 status is Open. Original cause of wound was Pressure Injury. The date acquired was: 12/05/2022. The wound has been in treatment 24 weeks. The wound is located on the Right,Medial Ankle. The wound measures 11.5cm length x 5cm width x 0.2cm depth; 45.16cm^2 area and 9.032cm^3 volume. There is a medium amount of serosanguineous drainage noted. The wound margin is flat and intact. There is no granulation within the wound bed. There is a large (67- 100%) amount of necrotic tissue within the wound bed including Eschar and Adherent Slough. Wound #13 status is Open. Original cause of wound was Pressure Injury. The date acquired was: 11/28/2022. The wound has been in treatment 24 weeks. The wound is located on the Left Calcaneus. The wound measures 1.5cm length x 2cm width x 0.2cm depth; 2.356cm^2 area and 0.471cm^3  volume. There is Fat Layer (Subcutaneous Tissue) exposed. There is a medium amount of serosanguineous drainage noted. The wound margin is flat and intact. There is small (1- 33%) pink granulation within the wound bed. There is a large (67-100%) amount of necrotic tissue within the wound bed including Adherent Slough. Wound #16 status is Open. Original cause of wound was Pressure Injury. The date acquired was: 05/22/2023. The wound is located on the Left Trochanter. The wound measures 1.8cm length x 2.3cm width x 0.1cm depth; 3.252cm^2 area and 0.325cm^3 volume. There is Fat Layer (Subcutaneous Tissue) exposed. There is a medium amount of serosanguineous drainage noted. Wound #17 status is Open. Original cause of wound was Pressure Injury. The date acquired was: 05/22/2023. The wound is located on the Left,Anterior Lower Leg. The wound measures 8.5cm length x 5cm width x 0.1cm depth; 33.379cm^2 area and 3.338cm^3 volume. There is a medium amount of serosanguineous drainage noted. Wound #6 status is Open. Original cause of wound was Pressure Injury. The date acquired was: 07/23/2022. The wound has been in treatment 34 weeks. The wound is located on the Right Calcaneus. The wound measures 4.5cm length x 5cm width x 0.2cm depth; 17.671cm^2 area and 3.534cm^3 volume. There is Fat Layer (Subcutaneous Tissue) exposed. There is a medium amount of serosanguineous drainage noted. The wound margin is distinct with the outline attached to the wound base. There is small (1-33%) pink granulation within the wound bed. There is a large (67-100%) amount of necrotic tissue within the wound bed including Adherent Slough. Wound #8 status is Healed - Epithelialized. Original cause of wound was Pressure Injury. The date acquired was: 07/23/2022. The wound has been in treatment 34 weeks. The wound is located on the Midline Sacrum. The wound measures 0cm length x 0cm width x 0cm depth; 0cm^2 area and 0cm^3 volume. There is  Fat Layer (Subcutaneous Tissue) exposed. There is a none present amount of drainage noted. The wound margin is distinct with the outline attached  to the wound base. There is no granulation within the wound bed. There is a small (1-33%) amount of necrotic tissue within the wound bed. Assessment Active Problems ICD-10 Non-pressure chronic ulcer of other part of left lower leg with fat layer exposed Non-pressure chronic ulcer of other part of right lower leg with fat layer exposed Chronic venous hypertension (idiopathic) with ulcer of left lower extremity Lymphedema, not elsewhere classified Type 2 diabetes mellitus with other skin ulcer Chronic diastolic (congestive) heart failure Pressure ulcer of left buttock, unstageable Pressure ulcer of right heel, unstageable Unspecified open wound, left foot, initial encounter Other acute osteomyelitis, left ankle and foot Pressure ulcer of right hip, unstageable Pressure ulcer of right ankle, stage 3 Pressure ulcer of left heel, unstageable Pressure ulcer of left hip, unstageable Unspecified open wound, left lower leg, initial encounter Long term (current) use of oral hypoglycemic drugs Patient's right hip wound has declined. This probes straight to bone and has maggots. I recommended she go to the ED for evaluation of likely admission and IV antibiotics. She has a new wound to the left hip that has nonviable tissue throughout. She also has a new wound to her left anterior leg with granulation tissue and nonviable tissue. The remaining wounds on her lower extremities are scattered and do not appear healthy. During the course of her admission at the wound care center we have discussed the importance of aggressive offloading, aggressive wound care for her healing. Patient has been noncompliant with these 2 components. I have Also recommended skilled nursing facility, hospice or palliative care During the admission however patient and family  have declined. They also declined the services when patient is discharged from the hospital which has happened several times over the past year. She needs more around-the-clock care if there is any chance of these wounds healing. This was discussed with the patient and patient's son who is present during the encounter. They expressed understanding. For now recommended Vashe wet-to-dry dressings to all the wound beds. Appointment for 2 weeks made. Plan Follow-up Appointments: Return Appointment in 1 month Bathing/ Shower/ Hygiene: Wash wounds with antibacterial soap and water. No tub bath. Anesthetic (Use 'Patient Medications' Section for Anesthetic Order Entry): Lidocaine applied to wound bed Off-Loading: Other: - wear prevalon heel boots at all times WOUND #11: - Trochanter Wound Laterality: Right Cleanser: Soap and Water 1 x Per Day/30 Days Discharge Instructions: Gently cleanse wound with antibacterial soap, rinse and pat dry prior to dressing wounds Cleanser: Vashe 5.8 (oz) 1 x Per Day/30 Days Discharge Instructions: Use vashe 5.8 (oz) as directed [name], GOWANS [name] ([id number]) [id number] Page 10 of 13 Prim Dressing: Gauze (Generic) 1 x Per Day/30 Days ary Discharge Instructions: Vashe wet to dry Secondary Dressing: (BORDER) Zetuvit Plus SILICONE BORDER Dressing 5x5 (in/in) (Dispense As Written) 1 x Per Day/30 Days Discharge Instructions: Please do not put silicone bordered dressings under wraps. Use non-bordered dressing only. WOUND #12: - Ankle Wound Laterality: Right, Medial Cleanser: Soap and Water 1 x Per Day/30 Days Discharge Instructions: Gently cleanse wound with antibacterial soap, rinse and pat dry prior to dressing wounds Cleanser: Vashe 5.8 (oz) 1 x Per Day/30 Days Discharge Instructions: Use vashe 5.8 (oz) as directed Prim Dressing: Honey: Activon Honey Gel, 25 (g) Tube (Dispense As Written) 1 x Per Day/30 Days ary Secondary Dressing:  Hydrofera Blue Ready Transfer Foam, 2.5x2.5 (in/in) (Dispense As Written) 1 x Per Day/30 Days Discharge Instructions: Apply to wound bed over non-stick dressing. Secured With: Medipore T -  46M Medipore H Soft Cloth Surgical T ape ape, 2x2 (in/yd) (Generic) 1 x Per Day/30 Days Secured With: Kerlix Roll Sterile or Non-Sterile 6-ply 4.5x4 (yd/yd) (Generic) 1 x Per Day/30 Days Discharge Instructions: Apply Kerlix as directed WOUND #13: - Calcaneus Wound Laterality: Left Cleanser: Soap and Water 1 x Per Day/30 Days Discharge Instructions: Gently cleanse wound with antibacterial soap, rinse and pat dry prior to dressing wounds Cleanser: Vashe 5.8 (oz) 1 x Per Day/30 Days Discharge Instructions: Use vashe 5.8 (oz) as directed Prim Dressing: Honey: Activon Honey Gel, 25 (g) Tube (Dispense As Written) 1 x Per Day/30 Days ary Secondary Dressing: ABD Pad 5x9 (in/in) (Generic) 1 x Per Day/30 Days Discharge Instructions: Cover with ABD pad Secondary Dressing: Hydrofera Blue Ready Transfer Foam, 2.5x2.5 (in/in) (Dispense As Written) 1 x Per Day/30 Days Discharge Instructions: Apply to wound bed over non-stick dressing. Secured With: Medipore T - 46M Medipore H Soft Cloth Surgical T ape ape, 2x2 (in/yd) (Generic) 1 x Per Day/30 Days Secured With: Kerlix Roll Sterile or Non-Sterile 6-ply 4.5x4 (yd/yd) (Generic) 1 x Per Day/30 Days Discharge Instructions: Apply Kerlix as directed WOUND #6: - Calcaneus Wound Laterality: Right Cleanser: Byram Ancillary Kit - 15 Day Supply (Generic) 3 x Per Week/30 Days Discharge Instructions: Use supplies as instructed; Kit contains: (15) Saline Bullets; (15) 3x3 Gauze; 15 pr Gloves Cleanser: Soap and Water 3 x Per Week/30 Days Discharge Instructions: Gently cleanse wound with antibacterial soap, rinse and pat dry prior to dressing wounds Cleanser: Vashe 5.8 (oz) 3 x Per Week/30 Days Discharge Instructions: Use vashe 5.8 (oz) as directed Topical: Activon Honey Gel, 25 (g)  Tube (Dispense As Written) 3 x Per Week/30 Days Prim Dressing: Hydrofera Blue Ready Transfer Foam, 2.5x2.5 (in/in) (Dispense As Written) 3 x Per Week/30 Days ary Discharge Instructions: Apply Hydrofera Blue Ready to wound bed as directed Secondary Dressing: ABD Pad 5x9 (in/in) (Generic) 3 x Per Week/30 Days Discharge Instructions: Cover with ABD pad Secondary Dressing: Kerlix 4.5 x 4.1 (in/yd) (Generic) 3 x Per Week/30 Days Discharge Instructions: Apply Kerlix 4.5 x 4.1 (in/yd) as instructed Secured With: Medipore T - 46M Medipore H Soft Cloth Surgical T ape ape, 2x2 (in/yd) (Generic) 3 x Per Week/30 Days 1. Vashe wet-to-dry dressings 2. Go to the ED for further evaluation of right hip and likely will need admission with IV antibiotics 3. Follow-up in 2 weeks 4. Aggressive offloading Electronic Signature(s) Signed: 05/22/2023 5:05:21 PM By: Geralyn Corwin DO Entered By: Geralyn Corwin on 05/22/2023 16:52:31 -------------------------------------------------------------------------------- ROS/PFSH Details Patient Name: Date of Service: [facility], [name] 05/22/2023 1:00 PM Medical Record Number: [id number] Patient Account Number: [id number] Date of Birth/Sex: Treating RN: 1941-09-01 (82 y.o. [name] Primary Care Provider: Barbette Reichmann Other Clinician: Betha Loa Referring Provider: Treating Provider/Extender: Adolph Pollack Weeks in Treatment: [address] Information Obtained From Patient Eyes [name], [name] ([id number]) [id number] Page 11 of 13 Medical History: Positive for: Cataracts - removed 2 years ago Negative for: Glaucoma; Optic Neuritis Ear/Nose/Mouth/Throat Medical History: Negative for: Chronic sinus problems/congestion; Middle ear problems Hematologic/Lymphatic Medical History: Positive for: Lymphedema Negative for: Anemia; Hemophilia; Human Immunodeficiency Virus; Sickle Cell  Disease Respiratory Medical History: Negative for: Aspiration; Asthma; Chronic Obstructive Pulmonary Disease (COPD); Pneumothorax; Sleep Apnea; Tuberculosis Cardiovascular Medical History: Positive for: Hypertension; Peripheral Arterial Disease; Peripheral Venous Disease Gastrointestinal Medical History: Negative for: Cirrhosis ; Colitis; Crohns; Hepatitis A; Hepatitis B; Hepatitis C Endocrine Medical History: Positive for: Type II Diabetes Time with diabetes: 8years Treated with:  Oral agents Blood sugar tested every day: No Genitourinary Medical History: Past Medical History Notes: Hx of Kidney stones, hysterectomy Musculoskeletal Medical History: Positive for: Osteoarthritis Past Medical History Notes: Rotator cuff disorder Neurologic Medical History: Positive for: Neuropathy Negative for: Dementia; Quadriplegia; Paraplegia; Seizure Disorder Oncologic Medical History: Past Medical History Notes: Breast Ca Psychiatric Medical History: Negative for: Anorexia/bulimia; Confinement Anxiety HBO Extended History Items Eyes: Cataracts Immunizations Pneumococcal Vaccine: Received Pneumococcal Vaccination: Yes Received Pneumococcal Vaccination On or After [name], [name] ([id number]) [id number] Page 12 of 13 Implantable Devices None Family and Social History Cancer: No; Diabetes: No; Heart Disease: Yes - Mother,Father; Hereditary Spherocytosis: No; Hypertension: Yes - Mother,Father; Kidney Disease: No; Lung Disease: No; Seizures: No; Stroke: Yes - Mother,Father; Thyroid Problems: No; Tuberculosis: No; Never smoker; Marital Status - Married; Alcohol Use: Never; Drug Use: No History; Caffeine Use: Daily; Financial Concerns: No; Food, Clothing or Shelter Needs: No; Support System Lacking: No; Transportation Concerns: No Electronic Signature(s) Signed: 05/22/2023 5:05:21 PM By: Geralyn Corwin DO Signed: 06/04/2023 8:06:11 AM  By: Elliot Gurney, BSN, RN, CWS, Kim RN, BSN Entered By: Geralyn Corwin on 05/22/2023 16:53:46 -------------------------------------------------------------------------------- SuperBill Details Patient Name: Date of Service: [facility], [name] 05/22/2023 Medical Record Number: [id number] Patient Account Number: [id number] Date of Birth/Sex: Treating RN: [birth year] (82 y.o. Cathlean Cower, Kim Primary Care Provider: Barbette Reichmann Other Clinician: Betha Loa Referring Provider: Treating Provider/Extender: Adolph Pollack Weeks in Treatment: 93 Diagnosis Coding ICD-10 Codes Code Description [phone] Non-pressure chronic ulcer of other part of left lower leg with fat layer exposed L97.812 Non-pressure chronic ulcer of other part of right lower leg with fat layer exposed I87.312 Chronic venous hypertension (idiopathic) with ulcer of left lower extremity I89.0 Lymphedema, not elsewhere classified E11.622 Type 2 diabetes mellitus with other skin ulcer I50.32 Chronic diastolic (congestive) heart failure L89.320 Pressure ulcer of left buttock, unstageable L89.610 Pressure ulcer of right heel, unstageable S91.302A Unspecified open wound, left foot, initial encounter M86.172 Other acute osteomyelitis, left ankle and foot L89.210 Pressure ulcer of right hip, unstageable L89.513 Pressure ulcer of right ankle, stage 3 L89.620 Pressure ulcer of left heel, unstageable L89.220 Pressure ulcer of left hip, unstageable S81.802A Unspecified open wound, left lower leg, initial encounter Z79.84 Long term (current) use of oral hypoglycemic drugs Facility Procedures : CPT4 Code: 17616073 Description: 71062 - WOUND CARE VISIT-LEV 5 EST PT Modifier: Quantity: 1 Physician Procedures : CPT4 Code Description Modifier [id number] - WC PHYS LEVEL 4 - EST PT ICD-10 Diagnosis Description L97.822 Non-pressure chronic ulcer of other part of left lower leg with fat layer exposed L97.812  Non-pressure chronic ulcer of other part of right  lower leg with fat layer exposed [name], [name] ([id number]) [id number] L89.210 Pressure ulcer of right hip, unstageable L89.220 Pressure ulcer of left hip, unstageable Quantity: 1 df Page 13 of 13 Electronic Signature(s) Signed: 05/27/2023 4:35:54 PM By: Betha Loa Signed: 05/30/2023 1:03:58 PM By: Geralyn Corwin DO Previous Signature: 05/22/2023 5:05:21 PM Version By: Geralyn Corwin DO Entered By: Betha Loa on 05/23/2023 08:28:55

## 2023-06-05 ENCOUNTER — Encounter (HOSPITAL_BASED_OUTPATIENT_CLINIC_OR_DEPARTMENT_OTHER): Payer: PPO | Admitting: Internal Medicine

## 2023-06-05 DIAGNOSIS — L97822 Non-pressure chronic ulcer of other part of left lower leg with fat layer exposed: Secondary | ICD-10-CM | POA: Diagnosis not present

## 2023-06-05 DIAGNOSIS — X58XXXA Exposure to other specified factors, initial encounter: Secondary | ICD-10-CM | POA: Diagnosis not present

## 2023-06-05 DIAGNOSIS — L89220 Pressure ulcer of left hip, unstageable: Secondary | ICD-10-CM | POA: Diagnosis not present

## 2023-06-05 DIAGNOSIS — E1169 Type 2 diabetes mellitus with other specified complication: Secondary | ICD-10-CM | POA: Diagnosis not present

## 2023-06-05 DIAGNOSIS — I110 Hypertensive heart disease with heart failure: Secondary | ICD-10-CM | POA: Diagnosis not present

## 2023-06-05 DIAGNOSIS — L89620 Pressure ulcer of left heel, unstageable: Secondary | ICD-10-CM | POA: Diagnosis not present

## 2023-06-05 DIAGNOSIS — L89610 Pressure ulcer of right heel, unstageable: Secondary | ICD-10-CM | POA: Diagnosis not present

## 2023-06-05 DIAGNOSIS — L89210 Pressure ulcer of right hip, unstageable: Secondary | ICD-10-CM | POA: Diagnosis not present

## 2023-06-05 DIAGNOSIS — L97812 Non-pressure chronic ulcer of other part of right lower leg with fat layer exposed: Secondary | ICD-10-CM

## 2023-06-05 DIAGNOSIS — I5032 Chronic diastolic (congestive) heart failure: Secondary | ICD-10-CM | POA: Diagnosis not present

## 2023-06-05 DIAGNOSIS — M86172 Other acute osteomyelitis, left ankle and foot: Secondary | ICD-10-CM | POA: Diagnosis not present

## 2023-06-05 DIAGNOSIS — Z853 Personal history of malignant neoplasm of breast: Secondary | ICD-10-CM | POA: Diagnosis not present

## 2023-06-05 DIAGNOSIS — Z7984 Long term (current) use of oral hypoglycemic drugs: Secondary | ICD-10-CM | POA: Diagnosis not present

## 2023-06-05 DIAGNOSIS — I87312 Chronic venous hypertension (idiopathic) with ulcer of left lower extremity: Secondary | ICD-10-CM | POA: Diagnosis not present

## 2023-06-05 DIAGNOSIS — S91302A Unspecified open wound, left foot, initial encounter: Secondary | ICD-10-CM | POA: Diagnosis not present

## 2023-06-05 DIAGNOSIS — E11622 Type 2 diabetes mellitus with other skin ulcer: Secondary | ICD-10-CM | POA: Diagnosis not present

## 2023-06-05 DIAGNOSIS — L89320 Pressure ulcer of left buttock, unstageable: Secondary | ICD-10-CM | POA: Diagnosis not present

## 2023-06-05 DIAGNOSIS — I890 Lymphedema, not elsewhere classified: Secondary | ICD-10-CM | POA: Diagnosis not present

## 2023-06-05 LAB — MINIMUM INHIBITORY CONC. (1 DRUG)

## 2023-06-05 NOTE — Progress Notes (Signed)
[name], [name] ([id number]) [id number] Page 1 of 15 Visit Report for 06/05/2023 Arrival Information Details Patient Name: Date of Service: [facility], [name] 06/05/2023 2:00 PM Medical Record Number: [id number] Patient Account Number: [id number] Date of Birth/Sex: Treating RN: 1941-09-15 (82 y.o. [name] Primary Care Cydne Grahn: Barbette Reichmann Other Clinician: Referring Regenia Erck: Treating [name]/Extender: Adolph Pollack Weeks in Treatment: 41 Visit Information History Since Last Visit All ordered tests and consults were completed: No Patient Arrived: Wheel Chair Added or deleted any medications: Yes Arrival Time: 13:56 Hospitalized since last visit: Yes Accompanied By: spouse Pain Present Now: Yes Transfer Assistance: Manual Patient Identification Verified: Yes Secondary Verification Process Completed: Yes Patient Requires Transmission-Based Precautions: No Patient Has Alerts: Yes Patient Alerts: DM II ABI R 1.30 TBI 1.12 ABI L 1.27 TBI 1.21 Electronic Signature(s) Signed: 06/05/2023 4:46:32 PM By: Bonnell Public Entered By: Bonnell Public on 06/05/2023 13:58:41 -------------------------------------------------------------------------------- Clinic Level of Care Assessment Details Patient Name: Date of Service: [name], [name] 06/05/2023 2:00 PM Medical Record Number: [id number] Patient Account Number: [id number] Date of Birth/Sex: Treating RN: November 28, 1940 (82 y.o. [name] Primary Care Braylynn Ghan: Barbette Reichmann Other Clinician: Referring Milca Sytsma: Treating Jasmynn Pfalzgraf/Extender: Adolph Pollack Weeks in Treatment: 36 Clinic Level of Care Assessment Items TOOL 1 Quantity Score []  - 0 Use when EandM and Procedure is performed on INITIAL visit ASSESSMENTS - Nursing Assessment / Reassessment X- 1 20 General Physical Exam (combine w/ comprehensive assessment (listed just below) when  performed on new pt. evals) X- 1 25 Comprehensive Assessment (HX, ROS, Risk Assessments, Wounds Hx, etc.) ASSESSMENTS - Wound and Skin Assessment / Reassessment []  - 0 Dermatologic / Skin Assessment (not related to wound area) Wlodarczyk, Loucile L ([id number]) [phone].pdf Page 2 of 15 ASSESSMENTS - Ostomy and/or Continence Assessment and Care X- 1 10 Incontinence Assessment and Management []  - 0 Ostomy Care Assessment and Management (repouching, etc.) PROCESS - Coordination of Care X - Simple Patient / Family Education for ongoing care 1 15 []  - 0 Complex (extensive) Patient / Family Education for ongoing care X- 1 10 Staff obtains Chiropractor, Records, T Results / Process Orders est X- 1 10 Staff telephones HHA, Nursing Homes / Clarify orders / etc []  - 0 Routine Transfer to another Facility (non-emergent condition) []  - 0 Routine [name] Admission (non-emergent condition) []  - 0 New Admissions / Manufacturing engineer / Ordering NPWT Apligraf, etc. , []  - 0 Emergency [name] Admission (emergent condition) PROCESS - Special Needs []  - 0 Pediatric / Minor Patient Management []  - 0 Isolation Patient Management []  - 0 Hearing / Language / Visual special needs []  - 0 Assessment of Community assistance (transportation, D/C planning, etc.) []  - 0 Additional assistance / Altered mentation X- 1 15 Support Surface(s) Assessment (bed, cushion, seat, etc.) INTERVENTIONS - Miscellaneous []  - 0 External ear exam []  - 0 Patient Transfer (multiple staff / Nurse, adult / Similar devices) []  - 0 Simple Staple / Suture removal (25 or less) []  - 0 Complex Staple / Suture removal (26 or more) []  - 0 Hypo/Hyperglycemic Management (do not check if billed separately) []  - 0 Ankle / Brachial Index (ABI) - do not check if billed separately Has the patient been seen at the [name] within the last three years: Yes Total Score: 105 Level Of Care: New/Established  - Level 3 Electronic Signature(s) Signed: 06/05/2023 4:46:32 PM By: Bonnell Public Entered By: Bonnell Public on 06/05/2023 16:41:50 -------------------------------------------------------------------------------- Encounter Discharge Information Details Patient Name:  Date of Service: [facility], [name] 06/05/2023 2:00 PM Medical Record Number: [id number] Patient Account Number: [id number] Date of Birth/Sex: Treating RN: 09-03-1941 (82 y.o. [name] Primary Care Lequita Meadowcroft: Barbette Reichmann Other Clinician: Referring Yandel Zeiner: Treating Jakel Alphin/Extender: Adolph Pollack Weeks in Treatment: 27 Encounter Discharge Information Items MARGEE, [name] ([id number]) [id number] Page 3 of 15 Discharge Condition: Stable Ambulatory Status: Wheelchair Discharge Destination: Home Transportation: Private Auto Accompanied By: family Schedule Follow-up Appointment: Yes Clinical Summary of Care: Electronic Signature(s) Signed: 06/05/2023 4:46:32 PM By: Bonnell Public Entered By: Bonnell Public on 06/05/2023 16:44:00 -------------------------------------------------------------------------------- Lower Extremity Assessment Details Patient Name: Date of Service: [facility], [name] 06/05/2023 2:00 PM Medical Record Number: [id number] Patient Account Number: [id number] Date of Birth/Sex: Treating RN: 1941/08/11 (82 y.o. [name] Primary Care Carrye Goller: Barbette Reichmann Other Clinician: Referring Izick Gasbarro: Treating [name]/Extender: Adolph Pollack Weeks in Treatment: 36 Electronic Signature(s) Signed: 06/05/2023 4:46:32 PM By: Bonnell Public Entered By: Bonnell Public on 06/05/2023 14:20:33 -------------------------------------------------------------------------------- Multi-Disciplinary Care Plan Details Patient Name: Date of Service: [facility], [name] 06/05/2023 2:00 PM Medical Record Number:  [id number] Patient Account Number: [id number] Date of Birth/Sex: Treating RN: Nov 21, 1940 (82 y.o. [name] Primary Care Meko Bellanger: Barbette Reichmann Other Clinician: Referring Natalyah Cummiskey: Treating Namari Breton/Extender: Adolph Pollack Weeks in Treatment: 79 Active Inactive Electronic Signature(s) Signed: 06/05/2023 4:46:32 PM By: Bonnell Public Entered By: Bonnell Public on 06/05/2023 16:42:54 Martire, [name] ([id number]) [id number] Page 4 of 15 -------------------------------------------------------------------------------- Pain Assessment Details Patient Name: Date of Service: [facility], [name] 06/05/2023 2:00 PM Medical Record Number: [id number] Patient Account Number: [id number] Date of Birth/Sex: Treating RN: [birth year] (82 y.o. [name] Primary Care Yannis Broce: Barbette Reichmann Other Clinician: Referring Nayson Traweek: Treating Sheniah Supak/Extender: Adolph Pollack Weeks in Treatment: 71 Active Problems Location of Pain Severity and Description of Pain Patient Has Paino Yes Site Locations Pain Location: Pain in Ulcers Pain Management and Medication Current Pain Management: Electronic Signature(s) Signed: 06/05/2023 4:46:32 PM By: Bonnell Public Entered By: Bonnell Public on 06/05/2023 14:05:04 -------------------------------------------------------------------------------- Patient/Caregiver Education Details Patient Name: Date of Service: [facility], [name] 7/24/2024andnbsp2:00 PM Medical Record Number: [id number] Patient Account Number: [id number] Date of Birth/Gender: Treating RN: 1941-04-19 (82 y.o. [name] Primary Care Physician: Barbette Reichmann Other Clinician: Referring Physician: Treating Physician/Extender: Adolph Pollack Weeks in Treatment: [address], [place] L ([id number]) [id number] Page 5 of 15 Education Assessment Education  Provided To: Patient and Caregiver Education Topics Provided Wound/Skin Impairment: Handouts: Caring for Your Ulcer Methods: Explain/Verbal Responses: State content correctly Electronic Signature(s) Signed: 06/05/2023 4:46:32 PM By: Bonnell Public Entered By: Bonnell Public on 06/05/2023 16:43:11 -------------------------------------------------------------------------------- Wound Assessment Details Patient Name: Date of Service: [facility], [name] 06/05/2023 2:00 PM Medical Record Number: [id number] Patient Account Number: [id number] Date of Birth/Sex: Treating RN: July 16, 1941 (82 y.o. [name] Primary Care Rashmi Tallent: Barbette Reichmann Other Clinician: Referring Memory Heinrichs: Treating Burris Matherne/Extender: Adolph Pollack Weeks in Treatment: 36 Wound Status Wound Number: 11 Primary Pressure Ulcer Etiology: Wound Location: Right Trochanter Wound Open Wounding Event: Pressure Injury Status: Date Acquired: 10/24/2022 Comorbid Cataracts, Lymphedema, Hypertension, Peripheral Arterial Weeks Of Treatment: 31 History: Disease, Peripheral Venous Disease, Type II Diabetes, Clustered Wound: No Osteoarthritis, Neuropathy Photos Wound Measurements Length: (cm) 3 Width: (cm) 4 Depth: (cm) 3 Area: (cm) 9.425 Volume: (cm) 28.274 % Reduction in Area: -354.7% % Reduction in Volume: -13558.9% Epithelialization: None Undermining: Yes Starting Position (o'clock): 12 Ending Position (o'clock):  12 Maximum Distance: (cm) 4.3 Wound Description THANA, RAMP ([id number]) Classification: Category/Stage IV Wound Margin: Well defined, not attached Exudate Amount: Medium Exudate Type: Serosanguineous Exudate Color: red, brown [phone].pdf Page 6 of 15 Foul Odor After Cleansing: No Slough/Fibrino Yes Wound Bed Granulation Amount: Large (67-100%) Exposed Structure Granulation Quality: Red Fascia Exposed: Yes Necrotic Amount: None Present  (0%) Fat Layer (Subcutaneous Tissue) Exposed: Yes Tendon Exposed: Yes Muscle Exposed: Yes Necrosis of Muscle: No Joint Exposed: No Bone Exposed: No Treatment Notes Wound #11 (Trochanter) Wound Laterality: Right Cleanser Vashe 5.8 (oz) Discharge Instruction: damp to dry daily Peri-Wound Care Topical Primary Dressing Secondary Dressing Secured With Compression Wrap Compression Stockings Add-Ons Electronic Signature(s) Signed: 06/05/2023 4:46:32 PM By: Bonnell Public Entered By: Bonnell Public on 06/05/2023 14:22:34 -------------------------------------------------------------------------------- Wound Assessment Details Patient Name: Date of Service: [name], [name] 06/05/2023 2:00 PM Medical Record Number: [id number] Patient Account Number: [id number] Date of Birth/Sex: Treating RN: March 04, 1941 (82 y.o. [name] Primary Care Bhumi Godbey: Barbette Reichmann Other Clinician: Referring Falicity Sheets: Treating Kamden Stanislaw/Extender: Adolph Pollack Weeks in Treatment: 36 Wound Status Wound Number: 12 Primary Pressure Ulcer Etiology: Wound Location: Right, Medial Ankle Wound Converted Wounding Event: Pressure Injury Status: Date Acquired: 12/05/2022 Comorbid Cataracts, Lymphedema, Hypertension, Peripheral Arterial Weeks Of Treatment: 26 History: Disease, Peripheral Venous Disease, Type II Diabetes, Clustered Wound: No Osteoarthritis, Neuropathy Wound Measurements Length: (cm) Routon, Layla L ([id number]) Width: (cm) Depth: (cm) Area: (cm) Volume: (cm) 0 % Reduction in Area: 100% [phone].pdf Page 7 of 15 0 % Reduction in Volume: 100% 0 Epithelialization: None 0 0 Wound Description Classification: Category/Stage IV Wound Margin: Flat and Intact Exudate Amount: Medium Exudate Type: Serosanguineous Exudate Color: red, brown Foul Odor After Cleansing: No Slough/Fibrino Yes Wound Bed Granulation Amount: None Present  (0%) Exposed Structure Necrotic Amount: Large (67-100%) Fascia Exposed: No Necrotic Quality: Eschar, Adherent Slough Fat Layer (Subcutaneous Tissue) Exposed: No Tendon Exposed: No Muscle Exposed: No Joint Exposed: No Bone Exposed: No Treatment Notes Wound #12 (Ankle) Wound Laterality: Right, Medial Cleanser Peri-Wound Care Topical Primary Dressing Secondary Dressing Secured With Compression Wrap Compression Stockings Add-Ons Electronic Signature(s) Signed: 06/05/2023 4:46:32 PM By: Bonnell Public Entered By: Bonnell Public on 06/05/2023 14:20:10 -------------------------------------------------------------------------------- Wound Assessment Details Patient Name: Date of Service: [name], [name] 06/05/2023 2:00 PM Medical Record Number: [id number] Patient Account Number: [id number] Date of Birth/Sex: Treating RN: [birth year] (82 y.o. [name] Primary Care Josselyn Harkins: Barbette Reichmann Other Clinician: Referring Kasia Trego: Treating Anglea Gordner/Extender: Adolph Pollack Weeks in Treatment: 36 Wound Status Wound Number: 13 Primary Pressure Ulcer Etiology: Wound Location: Left Calcaneus Wound Open Wounding Event: Pressure Injury Status: Date Acquired: 11/28/2022 Comorbid Cataracts, Lymphedema, Hypertension, Peripheral Arterial Weeks Of Treatment: 26 History: Disease, Peripheral Venous Disease, Type II Diabetes, Clustered Wound: No Osteoarthritis, Neuropathy Hund, Shonette L ([id number]) [phone].pdf Page 8 of 15 Photos Wound Measurements Length: (cm) 2 Width: (cm) 2 Depth: (cm) 0.2 Area: (cm) 3.142 Volume: (cm) 0.628 % Reduction in Area: 9.1% % Reduction in Volume: -81.5% Epithelialization: None Wound Description Classification: Category/Stage III Wound Margin: Flat and Intact Exudate Amount: Medium Exudate Type: Serosanguineous Exudate Color: red, brown Foul Odor After Cleansing: No Slough/Fibrino Yes Wound  Bed Granulation Amount: Medium (34-66%) Exposed Structure Granulation Quality: Pink Fascia Exposed: No Necrotic Amount: Medium (34-66%) Fat Layer (Subcutaneous Tissue) Exposed: Yes Necrotic Quality: Eschar, Adherent Slough Tendon Exposed: No Muscle Exposed: No Joint Exposed: No Bone Exposed: No Treatment Notes Wound #13 (Calcaneus) Wound Laterality: Left Cleanser Vashe 5.8 (oz)  Discharge Instruction: damp to dry daily Peri-Wound Care Topical Primary Dressing Secondary Dressing Secured With Compression Wrap Compression Stockings Add-Ons Electronic Signature(s) Signed: 06/05/2023 4:46:32 PM By: Bonnell Public Entered By: Bonnell Public on 06/05/2023 14:25:22 [place], [name] ([id number]) [id number] Page 9 of 15 -------------------------------------------------------------------------------- Wound Assessment Details Patient Name: Date of Service: [facility], [name] 06/05/2023 2:00 PM Medical Record Number: [id number] Patient Account Number: [id number] Date of Birth/Sex: Treating RN: [birth year] (82 y.o. [name] Primary Care Poppy Mcafee: Barbette Reichmann Other Clinician: Referring Judi Jaffe: Treating Oralee Rapaport/Extender: Adolph Pollack Weeks in Treatment: 36 Wound Status Wound Number: 14 Primary Diabetic Wound/Ulcer of the Lower Extremity Etiology: Wound Location: Right, Distal, Medial Lower Leg Wound Open Wounding Event: Gradually Appeared Status: Date Acquired: 03/10/2023 Comorbid Cataracts, Lymphedema, Hypertension, Peripheral Arterial Weeks Of Treatment: 12 History: Disease, Peripheral Venous Disease, Type II Diabetes, Clustered Wound: Yes Osteoarthritis, Neuropathy Photos Wound Measurements Length: (cm) 11.5 Width: (cm) 7 Depth: (cm) 0.3 Area: (cm) 63.2 Volume: (cm) 18.9 % Reduction in Area: -1050% % Reduction in Volume: -3348.5% Epithelialization: None 25 67 Wound Description Classification: Grade  2 Exudate Amount: Large Exudate Type: Serosanguineous Exudate Color: red, brown Foul Odor After Cleansing: No Slough/Fibrino Yes Wound Bed Granulation Amount: Large (67-100%) Exposed Structure Granulation Quality: Red Fascia Exposed: No Necrotic Amount: Small (1-33%) Fat Layer (Subcutaneous Tissue) Exposed: Yes Necrotic Quality: Adherent Slough Tendon Exposed: No Muscle Exposed: No Joint Exposed: No Bone Exposed: No Treatment Notes Wound #14 (Lower Leg) Wound Laterality: Right, Medial, Distal Cleanser Vashe 5.8 (oz) Discharge Instruction: damp to dry daily Kempa, Marriah L ([id number]) [phone].pdf Page 10 of 15 Peri-Wound Care Topical Primary Dressing Secondary Dressing Secured With Compression Wrap Compression Stockings Add-Ons Electronic Signature(s) Signed: 06/05/2023 4:46:32 PM By: Bonnell Public Entered By: Bonnell Public on 06/05/2023 14:26:21 -------------------------------------------------------------------------------- Wound Assessment Details Patient Name: Date of Service: [facility], [name] 06/05/2023 2:00 PM Medical Record Number: [id number] Patient Account Number: [id number] Date of Birth/Sex: Treating RN: 11-May-1941 (82 y.o. [name] Primary Care Haili Donofrio: Barbette Reichmann Other Clinician: Referring Shirline Kendle: Treating Jastin Fore/Extender: Adolph Pollack Weeks in Treatment: 36 Wound Status Wound Number: 15 Primary Pressure Ulcer Etiology: Wound Location: Left, Anterior Lower Leg Wound Open Wounding Event: Pressure Injury Status: Date Acquired: 04/11/2023 Comorbid Cataracts, Lymphedema, Hypertension, Peripheral Arterial Weeks Of Treatment: 7 History: Disease, Peripheral Venous Disease, Type II Diabetes, Clustered Wound: No Osteoarthritis, Neuropathy Photos Wound Measurements Length: (cm) 6.5 Width: (cm) 3 Depth: (cm) 0.2 Area: (cm) 15.315 Volume: (cm) 3.063 % Reduction in Area: 31.7% %  Reduction in Volume: -36.6% Epithelialization: None Tunneling: No Undermining: No Wound Description Classification: Category/Stage IV Exudate Amount: Medium Exudate Type: Serosanguineous Sorn, Aizza L ([id number]) Exudate Color: red, brown Foul Odor After Cleansing: No Slough/Fibrino Yes [phone].pdf Page 11 of 15 Wound Bed Granulation Amount: Large (67-100%) Exposed Structure Granulation Quality: Red Fascia Exposed: No Necrotic Amount: Small (1-33%) Fat Layer (Subcutaneous Tissue) Exposed: Yes Necrotic Quality: Adherent Slough Tendon Exposed: Yes Muscle Exposed: No Joint Exposed: No Bone Exposed: No Treatment Notes Wound #15 (Lower Leg) Wound Laterality: Left, Anterior Cleanser Vashe 5.8 (oz) Discharge Instruction: damp to dry daily Peri-Wound Care Topical Primary Dressing Secondary Dressing Secured With Compression Wrap Compression Stockings Add-Ons Electronic Signature(s) Signed: 06/05/2023 4:46:32 PM By: Bonnell Public Entered By: Bonnell Public on 06/05/2023 14:26:35 -------------------------------------------------------------------------------- Wound Assessment Details Patient Name: Date of Service: [facility], [name] 06/05/2023 2:00 PM Medical Record Number: [id number] Patient Account Number: [id number] Date of Birth/Sex: Treating RN: 01/16/1941 (82 y.o.  [name] Primary Care Laquanda Bick: Barbette Reichmann Other Clinician: Referring Santanna Whitford: Treating Cressie Betzler/Extender: Adolph Pollack Weeks in Treatment: 36 Wound Status Wound Number: 16 Primary Pressure Ulcer Etiology: Wound Location: Left Trochanter Wound Open Wounding Event: Pressure Injury Status: Date Acquired: 05/22/2023 Comorbid Cataracts, Lymphedema, Hypertension, Peripheral Arterial Weeks Of Treatment: 2 History: Disease, Peripheral Venous Disease, Type II Diabetes, Clustered Wound: No Osteoarthritis, Neuropathy Photos Ingalls,  Gaynelle L ([id number]) [phone].pdf Page 12 of 15 Wound Measurements Length: (cm) 1.5 Width: (cm) 2.5 Depth: (cm) 0.1 Area: (cm) 2.945 Volume: (cm) 0.295 % Reduction in Area: 9.4% % Reduction in Volume: 9.2% Wound Description Classification: Unstageable/Unclassified Exudate Amount: Medium Exudate Type: Serosanguineous Exudate Color: red, brown Foul Odor After Cleansing: No Slough/Fibrino Yes Wound Bed Necrotic Amount: Large (67-100%) Exposed Structure Necrotic Quality: Eschar, Adherent Slough Fascia Exposed: No Fat Layer (Subcutaneous Tissue) Exposed: Yes Tendon Exposed: No Muscle Exposed: No Joint Exposed: No Bone Exposed: No Treatment Notes Wound #16 (Trochanter) Wound Laterality: Left Cleanser Normal Saline Discharge Instruction: Wash your hands with soap and water. Remove old dressing, discard into plastic bag and place into trash. Cleanse the wound with Normal Saline prior to applying a clean dressing using gauze sponges, not tissues or cotton balls. Do not scrub or use excessive force. Pat dry using gauze sponges, not tissue or cotton balls. Peri-Wound Care Topical Santyl Collagenase Ointment, 30 (gm), tube Discharge Instruction: apply nickel thick to wound bed only Primary Dressing Secondary Dressing (BORDER) Zetuvit Plus SILICONE BORDER Dressing 4x4 (in/in) Discharge Instruction: Please do not put silicone bordered dressings under wraps. Use non-bordered dressing only. Secured With Compression Wrap Compression Stockings Add-Ons Electronic Signature(s) Signed: 06/05/2023 4:46:32 PM By: Bonnell Public Entered By: Bonnell Public on 06/05/2023 14:27:14 Rozell, [name] ([id number]) [id number] Page 13 of 15 -------------------------------------------------------------------------------- Wound Assessment Details Patient Name: Date of Service: [name], [name] 06/05/2023 2:00 PM Medical Record Number:  [id number] Patient Account Number: [id number] Date of Birth/Sex: Treating RN: August 02, 1941 (82 y.o. [name] Primary Care Kaylani Fromme: Barbette Reichmann Other Clinician: Referring Bobby Ragan: Treating Auryn Paige/Extender: Adolph Pollack Weeks in Treatment: 36 Wound Status Wound Number: 17 Primary Pressure Ulcer Etiology: Wound Location: Left, Anterior Lower Leg Wound Converted Wounding Event: Pressure Injury Status: Date Acquired: 05/22/2023 Comorbid Cataracts, Lymphedema, Hypertension, Peripheral Arterial Weeks Of Treatment: 2 History: Disease, Peripheral Venous Disease, Type II Diabetes, Clustered Wound: No Osteoarthritis, Neuropathy Wound Measurements Length: (cm) Width: (cm) Depth: (cm) Area: (cm) Volume: (cm) 0 % Reduction in Area: 100% 0 % Reduction in Volume: 100% 0 0 0 Wound Description Classification: Category/Stage I Exudate Amount: Medium Exudate Type: Serosanguineous Exudate Color: red, brown Treatment Notes Wound #17 (Lower Leg) Wound Laterality: Left, Anterior Cleanser Peri-Wound Care Topical Primary Dressing Secondary Dressing Secured With Compression Wrap Compression Stockings Add-Ons Electronic Signature(s) Signed: 06/05/2023 4:46:32 PM By: Bonnell Public Entered By: Bonnell Public on 06/05/2023 14:20:22 Gedeon, [name] ([id number]) [id number] Page 14 of 15 -------------------------------------------------------------------------------- Wound Assessment Details Patient Name: Date of Service: [name], [name] 06/05/2023 2:00 PM Medical Record Number: [id number] Patient Account Number: [id number] Date of Birth/Sex: Treating RN: 1940/11/25 (82 y.o. [name] Primary Care Brier Firebaugh: Barbette Reichmann Other Clinician: Referring Fumiko Cham: Treating Jesaiah Fabiano/Extender: Adolph Pollack Weeks in Treatment: 36 Wound Status Wound Number: 6 Primary Pressure Ulcer Etiology: Wound  Location: Right Calcaneus Wound Open Wounding Event: Pressure Injury Status: Date Acquired: 07/23/2022 Comorbid Cataracts, Lymphedema, Hypertension, Peripheral Arterial Weeks Of Treatment: 36 History: Disease, Peripheral Venous Disease, Type II Diabetes, Clustered  Wound: No Osteoarthritis, Neuropathy Photos Wound Measurements Length: (cm) Width: (cm) Depth: (cm) Area: (cm) Volume: (cm) 4 % Reduction in Area: 13.3% 2.6 % Reduction in Volume: 13.3% 0.3 Epithelialization: None 8.168 2.45 Wound Description Classification: Category/Stage III Wound Margin: Distinct, outline attached Exudate Amount: Medium Exudate Type: Serosanguineous Exudate Color: red, brown Foul Odor After Cleansing: No Slough/Fibrino Yes Wound Bed Granulation Amount: Medium (34-66%) Exposed Structure Granulation Quality: Red, Pink Fascia Exposed: No Necrotic Amount: Medium (34-66%) Fat Layer (Subcutaneous Tissue) Exposed: Yes Necrotic Quality: Adherent Slough Tendon Exposed: No Muscle Exposed: No Joint Exposed: No Bone Exposed: No Treatment Notes Wound #6 (Calcaneus) Wound Laterality: Right Cleanser Vashe 5.8 (oz) Discharge Instruction: damp to dry daily Meinzer, Mar L ([id number]) [phone].pdf Page 15 of 15 Peri-Wound Care Topical Primary Dressing Secondary Dressing Secured With Compression Wrap Compression Stockings Add-Ons Electronic Signature(s) Signed: 06/05/2023 4:46:32 PM By: Bonnell Public Entered By: Bonnell Public on 06/05/2023 14:28:15 -------------------------------------------------------------------------------- Vitals Details Patient Name: Date of Service: [facility], [name] 06/05/2023 2:00 PM Medical Record Number: [id number] Patient Account Number: [id number] Date of Birth/Sex: Treating RN: 1941/10/12 (82 y.o. F) Coulter, [name] Wilhelmena Zea: Barbette Reichmann Other Clinician: Referring [name]: Treating Demarcus Thielke/Extender: Adolph Pollack Weeks in Treatment: 90 Vital Signs Time Taken: 13:58 Temperature (F): 98.2 Weight (lbs): 98 Pulse (bpm): 78 Respiratory Rate (breaths/min): 18 Blood Pressure (mmHg): 95/57 Reference Range: 80 - 120 mg / dl Electronic Signature(s) Signed: 06/06/2023 3:10:37 PM By: Bonnell Public Previous Signature: 06/05/2023 4:46:32 PM Version By: Bonnell Public Entered By: Bonnell Public on 06/06/2023 14:16:40

## 2023-06-06 LAB — MIC RESULT

## 2023-06-06 LAB — MINIMUM INHIBITORY CONC. (1 DRUG)

## 2023-06-07 DIAGNOSIS — L89214 Pressure ulcer of right hip, stage 4: Secondary | ICD-10-CM | POA: Diagnosis not present

## 2023-06-07 DIAGNOSIS — S51801D Unspecified open wound of right forearm, subsequent encounter: Secondary | ICD-10-CM | POA: Diagnosis not present

## 2023-06-07 DIAGNOSIS — L89623 Pressure ulcer of left heel, stage 3: Secondary | ICD-10-CM | POA: Diagnosis not present

## 2023-06-07 DIAGNOSIS — L988 Other specified disorders of the skin and subcutaneous tissue: Secondary | ICD-10-CM | POA: Diagnosis not present

## 2023-06-07 DIAGNOSIS — L89150 Pressure ulcer of sacral region, unstageable: Secondary | ICD-10-CM | POA: Diagnosis not present

## 2023-06-07 DIAGNOSIS — L89613 Pressure ulcer of right heel, stage 3: Secondary | ICD-10-CM | POA: Diagnosis not present

## 2023-06-07 DIAGNOSIS — L89220 Pressure ulcer of left hip, unstageable: Secondary | ICD-10-CM | POA: Diagnosis not present

## 2023-06-10 DIAGNOSIS — M5136 Other intervertebral disc degeneration, lumbar region: Secondary | ICD-10-CM | POA: Diagnosis not present

## 2023-06-10 DIAGNOSIS — L89890 Pressure ulcer of other site, unstageable: Secondary | ICD-10-CM | POA: Diagnosis not present

## 2023-06-10 DIAGNOSIS — M4802 Spinal stenosis, cervical region: Secondary | ICD-10-CM | POA: Diagnosis not present

## 2023-06-10 DIAGNOSIS — Z79899 Other long term (current) drug therapy: Secondary | ICD-10-CM | POA: Diagnosis not present

## 2023-06-10 DIAGNOSIS — L89213 Pressure ulcer of right hip, stage 3: Secondary | ICD-10-CM | POA: Diagnosis not present

## 2023-06-10 DIAGNOSIS — L89892 Pressure ulcer of other site, stage 2: Secondary | ICD-10-CM | POA: Diagnosis not present

## 2023-06-10 DIAGNOSIS — M5412 Radiculopathy, cervical region: Secondary | ICD-10-CM | POA: Diagnosis not present

## 2023-06-10 DIAGNOSIS — L89610 Pressure ulcer of right heel, unstageable: Secondary | ICD-10-CM | POA: Diagnosis not present

## 2023-06-10 DIAGNOSIS — M5020 Other cervical disc displacement, unspecified cervical region: Secondary | ICD-10-CM | POA: Diagnosis not present

## 2023-06-10 DIAGNOSIS — M48062 Spinal stenosis, lumbar region with neurogenic claudication: Secondary | ICD-10-CM | POA: Diagnosis not present

## 2023-06-10 DIAGNOSIS — M5030 Other cervical disc degeneration, unspecified cervical region: Secondary | ICD-10-CM | POA: Diagnosis not present

## 2023-06-10 DIAGNOSIS — L89154 Pressure ulcer of sacral region, stage 4: Secondary | ICD-10-CM | POA: Diagnosis not present

## 2023-06-10 DIAGNOSIS — L89613 Pressure ulcer of right heel, stage 3: Secondary | ICD-10-CM | POA: Diagnosis not present

## 2023-06-10 DIAGNOSIS — M5416 Radiculopathy, lumbar region: Secondary | ICD-10-CM | POA: Diagnosis not present

## 2023-06-11 NOTE — Progress Notes (Signed)
DALYNN, STEUERWALD ([id number]) [id number] Page 1 of 11 Visit Report for 06/05/2023 Chief Complaint Document Details Patient Name: Date of Service: [name], [name]. 06/05/2023 2:00 PM Medical Record Number: [id number] Patient Account Number: [id number] Date of Birth/Sex: Treating RN: [birth year] (82 y.o. [name], [name] Primary Care Provider: Barbette Reichmann Other Clinician: Referring Provider: Treating Provider/Extender: Adolph Pollack Weeks in Treatment: 50 Information Obtained from: Patient Chief Complaint Right heel wound, right hip wound, right ankle wound, left ankle wound, right lower extremity wound, left anterior lower extremity wound, left hip wound Electronic Signature(s) Signed: 06/05/2023 4:29:46 PM By: Geralyn Corwin DO Entered By: Geralyn Corwin on 06/05/2023 14:32:37 -------------------------------------------------------------------------------- HPI Details Patient Name: Date of Service: [facility], [name]. 06/05/2023 2:00 PM Medical Record Number: [id number] Patient Account Number: [id number] Date of Birth/Sex: Treating RN: 1941-03-30 (82 y.o. [name], [name] Primary Care Provider: Barbette Reichmann Other Clinician: Referring Provider: Treating Provider/Extender: Adolph Pollack Weeks in Treatment: 74 History of Present Illness HPI Description: 82 year old patient who comes with a referral for bilateral lower extremity edema and a lower extremity ulceration and has been sent by her PCP [name]. I understand the patient was recently put on amoxicillin and doxycycline but could not tolerate the amoxicillin. doxycycline course was completed. a BNP and EKG was supposed to be normal and the patient did not have any dyspnea. the patient has been on a diuretic. The patient was also prescribed a pair of elastic compression stockings of the 20-30 mmHg pressure variety. x-ray of the right  ankle was done on 09/20/2015 and showed posttraumatic and postsurgical changes of the right ankle with secondary degenerative changes of the tibiotalar joint and to a lesser degree the subtalar joint. No definite acute bony abnormalities are noted. Past medical history significant for diabetes mellitus, hypertension, hyperlipidemia, right breast cancer treated with a mastectomy in 2014. She has never smoked. 10/24/2015 -- she had delayed her vascular test because of her husband surgery but she is now ready to get him taken care of. He is also unable to use compression stockings and hence we will need to order her Juzo wraps. 10/31/2015-- was seen by Dr. Wyn Quaker on 10/28/2015. She had a left lower extremity arterial duplex done at his office a couple of years ago and that was essentially normal. Today they performed a venous duplex which revealed no evidence of deep vein thrombosis, superficial thrombophlebitis, no venous reflex was seen on the right and a minimal amount of reflux was seen on the left great saphenous vein but no significant reflux was seen. Impression was that there was a component of lymphedema present from a previous surgery and he would recommend compression stockings and leg elevation. [name], [name] ([id number]) [id number] Page 2 of 11 Readmission: 07-24-2022 upon evaluation today patient presents for initial inspection here in our clinic concerning issues that she has been having with her legs this is actually been going on for several years according to what her family member with her today tells me as well as what the patient reiterates as well. She is currently most recently been seeing Dr. Ether Griffins and subsequently he had her in [place] boots. However 2 weeks ago he referred her to [place] and then subsequently took her out of the [place] boot wraps at that point. At this time the left leg looks to be worse in the right leg currently. She is on Lasix and  lisinopril with hydrochlorothiazide she has high blood pressure she  also has issues currently with lower extremity swelling and edema which has been an ongoing issue for her as well. Patient does have a history of chronic venous hypertension, lymphedema, diabetes mellitus type 2, hypertension, peripheral vascular disease, and neuropathy. Currently she is on Lasix as well as lisinopril with hydrochlorothiazide. 08-02-2022 upon evaluation today patient presents for reevaluation the good news is she is actually doing somewhat better in regard to the wound and the overall appearance and sinuses. The unfortunate thing is her infection really is not significantly improved we did have to switch out her antibiotic once we got that final result back and I switched her to Levaquin and away from the doxycycline. Unfortunately the doxycycline had been doing poorly for her. In fact she had had diarrhea from the time she started taking it on Friday and she is still having it when she shows up today for evaluation. Again I was not aware of this and obviously she does appear to be somewhat dehydrated as well based on what I see. My concern which I discussed with the patient today is the possibility of a C. difficile infection. With that being said fact this started immediately upon taking the doxycycline makes me think that it was just the medicine and is not completely out of her system yet despite having taken the last dose Tuesday morning. Nonetheless with what we are seeing currently I want to be sure that reason I Minna contact her primary care provider and see if a would be willing to see her and test for C. difficile infection. 08-07-2022 upon evaluation today patient appears to be doing well currently in regard to her wounds which are actually measuring much better this is great news. Fortunately I do not see any signs of active infection locally or systemically at this time which is great as well. The good news  is she was tested for a C. difficile infection and it was negative. I am very pleased and thankful for primary care provider for doing that so this means that she was just having a severe reaction to the doxycycline we have added that to her allergy list at this point. 08-14-2022 upon evaluation today patient appears to be doing well currently in regard to her dehydration she is actually significantly improved compared to last time I saw her last week. With that being said I do not see any evidence of active infection systemically at this time which is great news. However locally she still does appear to have cellulitis in left lower extremity. I discussed with her today that I do believe she would benefit from going ahead and starting the Levaquin. Again we will concerned about the C. difficile infection of the diarrhea but that this turned out to be just an issue with a reaction to the doxycycline. My hope is that the Levaquin will not cause her any complication and will be able to treat the infection. I did review her arterial study as well which was dated on 07-31-2022. It showed that she had a ABI on the right of 1.30 and on the left of 1.27 with a TBI on the right of 1.12 and the left 1.21 this is a normal arterial study. Again on the patient's wound culture she actually did show evidence of Proteus, Morganella, and MRSA. Levaquin is a good option here across the board. 09/26/2022 [name] is a 82 year old female with a past medical history of type 2 diabetes currently controlled on oral agents, venous insufficiency/lymphedema,  right breast cancer and chronic diastolic heart failure that presents to the clinic for a 1 month history of nonhealing wounds to the left lower extremity, right heel and left buttocks. She states that the buttocks wound and the right heel wound developed while she was in the hospital. She was admitted on 08/22/2022 for severe sepsis secondary to acute sigmoid  and distal colonic diverticulitis. At that time it was noted she had a stage I decubitus ulcer to her bilateral buttocks. A wound to the heel was not mentioned. She currently denies systemic signs of infection. She came into clinic in a blue gown with no undergarments. She has not been dressing the wounds. She states she just recently obtained home health and they are coming out for the first time this week. She is seen in our clinic often for lower extremity wounds secondary to venous insufficiency. 11/22; patient presents for follow-up. Son is present during the encounter. Per son it sounds like they are not doing any dressing changes. She states she has home health but they have not come out. She is going to let us know which home health agency she has been approved for so we can send orders. Currently she denies signs of infection. 12/13; patient presents for follow-up. Patient has home health and they are coming out once a week. It appears that there has not been any dressing changes except for with home health. Patient has a newly discovered wound to the left foot. There is exposed bone. There is slight erythema and increased warmth to the surrounding tissue. Patient is completely unaware of this wound. 12/20; patient presents for follow-up. She has been taking her oral antibiotics. She now has an eschar and wound to the right hip. She has been using Dakin's wet-to-dry dressings to the left foot wound and buttocks wound. She has been using Medihoney and silver alginate to the right heel wound. She currently denies systemic signs of infection. She is mainly bedbound or in a wheelchair. 1/10; patient presents for follow-up. Patient had her left second toenail removed by Dr. Ether Griffins, podiatry on 12/21. Unfortunately she did not feel well and she was admitted to the hospital for sepsis. Her right heel wound was thought to be infected and this was debrided in the OR by Dr. Ether Griffins. Culture and  bone biopsy had no growth. She has been using Medihoney to all the wound beds except for the lefty buttocks wound for which she uses Dakin's wet-to-dry dressings. She has developed a pressure injury to the left heel now. This is despite having Prevalon boots. Although she is not wearing them today. She has completed 4 weeks of Augmentin and also a week of doxycycline for osteomyelitis of the left foot. Her left foot wound no longer probes to bone. Currently she denies signs of infection. 1/24; patient presents for follow-up. She has been using Medihoney and Hydrofera Blue to the wound beds except for this buttocks wound she has been using Dakin's wet-to-dry dressings. She has developed 2 new wounds 1 to the left heel and the other to the right medial ankle. She claims she is wearing her Prevalon boots all the time although she does not have them on today. She currently denies signs of infection. 2/21; patient presents for follow-up. She was recently hospitalized on 12/19/2022 for sepsis secondary to Enterococcus bacillus bacteremia. She was treated with IV antibiotics and has completed her discharge oral antibiotics. She had a CT scanning of the abdomen/pelvis without acute abnormalities. Husband is  present with patient today. They have been using Dakin's wet-to-dry dressings to the sacral wound and hip wound and Hydrofera Blue and Medihoney to the feet wounds. They are not changing the dressings daily. It is unclear how often they actually change the dressings. She has been wearing her Prevalon boots at night but not during the day. She currently denies systemic signs of infection. 3/20; patient presents for follow-up. She again was in the hospital for severe sepsis on 2/24; MRIs at that time showed findings suspicious for osteomyelitis to the right hip. She is currently on Augmentin to complete 4 weeks of this. She is following with ID. She has been using Dakin's wet-to-dry dressings to the sacrum and  right hip however the solution is starting to cause discomfort. T the rest of the wound she has been using Hydrofera Blue and Medihoney. Patient o and son are not interested in doing palliative care or hospice. They state that they had discussions while in the hospital. 4/17; patient presents for follow-up. She has been using Vashe wet-to-dry dressings to the sacrum and right trochanter wound. She has been using Medihoney and Hydrofera Blue to the bilateral feet wounds. She has no issues or complaints today. She denies signs of infection. 5/1; patient developed a new wound to the right leg. Son was present and states that there was a scab and he removed it creating a wound. He has been keeping the area covered. T the sacral and right trochanter wound they have been using Vashe wet-to-dry dressings and Medihoney and Hydrofera Blue to the o bilateral feet wounds. She denies signs of infection. 6/1; 1 month follow-up. She has a new area on the left anterior lower leg. In the meantime the sacral ulcer is closed. She still has an extensive wound on the right hip with exposed tendon over the greater trochanter. On the lower extremities the left anterior lower leg wound is new right ankle left heel and right heel all look reasonably stable. We are using Vashe wet-to-dry on the hip Medihoney and Hydrofera Blue on the lower extremities. She is accompanied by her son I think who is doing the dressings. They do not have a pressure-relief surface at home but they do have a hospital bed [facility] [name] ([id number]) [id number] Page 3 of 11 7/10; I have not seen this patient since 03/13/2023. She was scheduled to be seen on 5/29 however missed her appointment due to illness. She was seen on 6/5 by Dr. Leanord Hawking and she presents today for her follow-up. Unfortunately her wounds have declined since I last saw her. She has several new wounds including one to the left hip wound and one left  anterior lower leg. The right hip wound has declined and has maggots with bone exposed. She has been using Vashe wet- to-dry dressings to Right hip wound and Medihoney and Hydrofera Blue to the The remaining wounds. Sacral wound remains closed. 7/24; patient presents for follow-up. At last clinic visit I recommended she go to the ED for potential admission, IV antibiotics and further imaging. She had MRI of the pelvis and left foot that showed osteomyelitis. Podiatry advised conservative management. She was also seen by infectious disease who discharged patient on 2 weeks of linezolid and 2 weeks of ciprofloxacin. Patient is at home and has home health. Palliative care was consulted during the stay and she is full code and has declined further outpatient palliative care and hospice services.. She currently denies systemic signs of infection. Overall there is  improvement in the appearance of the wounds. There is healthier granulation tissue present. However this is overall poor prognosis case. She has Prevalon boots but it is unclear if she is using these. She does not have them on today. She follows up with infectious disease next week. Electronic Signature(s) Signed: 06/05/2023 4:29:46 PM By: Geralyn Corwin DO Entered By: Geralyn Corwin on 06/05/2023 14:37:26 -------------------------------------------------------------------------------- Physical Exam Details Patient Name: Date of Service: [facility], [name]. 06/05/2023 2:00 PM Medical Record Number: [id number] Patient Account Number: [id number] Date of Birth/Sex: Treating RN: [birth year] (82 y.o. [name], [name] Primary Care Provider: Barbette Reichmann Other Clinician: Referring Provider: Treating Provider/Extender: Marcha Dutton, Vishwanath Weeks in Treatment: 36 Constitutional . Cardiovascular . Psychiatric . Notes T the right There is an open wound that probes straight to bone with maggots present. T the left  trochanter there is an unstageable wound. T the lower o o o extremities and feet bilaterally there are scattered open wounds with granulation tissue and nonviable tissue. No obvious soft tissue infection to the lower extremities. Electronic Signature(s) Signed: 06/05/2023 4:29:46 PM By: Geralyn Corwin DO Entered By: Geralyn Corwin on 06/05/2023 14:37:57 -------------------------------------------------------------------------------- Physician Orders Details Patient Name: Date of Service: [facility], [name]. 06/05/2023 2:00 PM Medical Record Number: [id number] Patient Account Number: [id number] Date of Birth/Sex: Treating RN: December 27, 1940 (82 y.o. [name], [name] Primary Care Provider: Barbette Reichmann Other Clinician: NITA, LETSCHE ([id number]) [id number] Page 4 of 11 Referring Provider: Treating Provider/Extender: Adolph Pollack Weeks in Treatment: 12 Verbal / Phone Orders: No Diagnosis Coding ICD-10 Coding Code Description [phone] Non-pressure chronic ulcer of other part of left lower leg with fat layer exposed L97.812 Non-pressure chronic ulcer of other part of right lower leg with fat layer exposed I87.312 Chronic venous hypertension (idiopathic) with ulcer of left lower extremity I89.0 Lymphedema, not elsewhere classified E11.622 Type 2 diabetes mellitus with other skin ulcer I50.32 Chronic diastolic (congestive) heart failure L89.320 Pressure ulcer of left buttock, unstageable L89.610 Pressure ulcer of right heel, unstageable S91.302A Unspecified open wound, left foot, initial encounter M86.172 Other acute osteomyelitis, left ankle and foot L89.210 Pressure ulcer of right hip, unstageable L89.513 Pressure ulcer of right ankle, stage 3 L89.620 Pressure ulcer of left heel, unstageable L89.220 Pressure ulcer of left hip, unstageable S81.802A Unspecified open wound, left lower leg, initial encounter Z79.84 Long term  (current) use of oral hypoglycemic drugs Follow-up Appointments Return Appointment in 2 weeks. Home Health [facility] for wound care. May utilize formulary equivalent dressing for wound treatment orders unless otherwise specified. Home Health Nurse may visit PRN to address patients wound care needs. - left trochanter- santyl daily all other wounds- vashe damp tp dry daily follow up on group 2 low air loss mattress Off-Loading Low air-loss mattress (Group 2) Wound Treatment Wound #11 - Trochanter Wound Laterality: Right Cleanser: Vashe 5.8 (oz) (Home Health) 1 x Per Day/30 Days Discharge Instructions: damp to dry daily Wound #13 - Calcaneus Wound Laterality: Left Cleanser: Vashe 5.8 (oz) (Home Health) 1 x Per Day/30 Days Discharge Instructions: damp to dry daily Wound #14 - Lower Leg Wound Laterality: Right, Medial, Distal Cleanser: Vashe 5.8 (oz) (Home Health) 1 x Per Day/30 Days Discharge Instructions: damp to dry daily Wound #15 - Lower Leg Wound Laterality: Left, Anterior Cleanser: Vashe 5.8 (oz) (Home Health) 1 x Per Day/30 Days Discharge Instructions: damp to dry daily Wound #16 - Trochanter Wound Laterality: Left Cleanser: Normal Saline (Home Health) 1 x Per Day/30  Days Discharge Instructions: Wash your hands with soap and water. Remove old dressing, discard into plastic bag and place into trash. Cleanse the wound with Normal Saline prior to applying a clean dressing using gauze sponges, not tissues or cotton balls. Do not scrub or use excessive force. Pat dry using gauze sponges, not tissue or cotton balls. Topical: Santyl Collagenase Ointment, 30 (gm), tube (Home Health) 1 x Per Day/30 Days Discharge Instructions: apply nickel thick to wound bed only Secondary Dressing: (BORDER) Zetuvit Plus SILICONE BORDER Dressing 4x4 (in/in) (Home Health) 1 x Per Day/30 Days Discharge Instructions: Please do not put silicone bordered dressings under wraps. Use non-bordered dressing  only. Wound #6 - Calcaneus Wound Laterality: Right Cleanser: Vashe 5.8 (oz) (Home Health) 1 x Per Day/30 Days Discharge Instructions: damp to dry daily Mathwig, Aubreyana [name] ([id number]) [phone].pdf Page 5 of 11 Electronic Signature(s) Signed: 06/05/2023 4:46:32 PM By: Bonnell Public Signed: 06/10/2023 5:28:05 PM By: Geralyn Corwin DO Previous Signature: 06/05/2023 4:29:46 PM Version By: Geralyn Corwin DO Entered By: Bonnell Public on 06/05/2023 16:41:04 -------------------------------------------------------------------------------- Problem List Details Patient Name: Date of Service: [facility], [name]. 06/05/2023 2:00 PM Medical Record Number: [id number] Patient Account Number: [id number] Date of Birth/Sex: Treating RN: 23-Feb-1941 (82 y.o. [name], [name] Primary Care Provider: Barbette Reichmann Other Clinician: Referring Provider: Treating Provider/Extender: Adolph Pollack Weeks in Treatment: 88 Active Problems ICD-10 Encounter Code Description Active Date MDM Diagnosis L97.822 Non-pressure chronic ulcer of other part of left lower leg with fat layer 09/26/2022 No Yes exposed L97.812 Non-pressure chronic ulcer of other part of right lower leg with fat layer 03/13/2023 No Yes exposed I87.312 Chronic venous hypertension (idiopathic) with ulcer of left lower extremity 09/26/2022 No Yes I89.0 Lymphedema, not elsewhere classified 09/26/2022 No Yes E11.622 Type 2 diabetes mellitus with other skin ulcer 09/26/2022 No Yes I50.32 Chronic diastolic (congestive) heart failure 09/26/2022 No Yes L89.320 Pressure ulcer of left buttock, unstageable 09/26/2022 No Yes L89.610 Pressure ulcer of right heel, unstageable 09/26/2022 No Yes S91.302A Unspecified open wound, left foot, initial encounter 10/24/2022 No Yes M86.172 Other acute osteomyelitis, left ankle and foot 10/24/2022 No Yes Hon, [name] ([id number])  [id number] Page 6 of 11 L89.214 Pressure ulcer of right hip, stage 4 11/21/2022 No Yes L89.513 Pressure ulcer of right ankle, stage 3 12/05/2022 No Yes L89.620 Pressure ulcer of left heel, unstageable 12/05/2022 No Yes L89.220 Pressure ulcer of left hip, unstageable 05/22/2023 No Yes S81.802A Unspecified open wound, left lower leg, initial encounter 05/22/2023 No Yes Z79.84 Long term (current) use of oral hypoglycemic drugs 03/13/2023 No Yes Inactive Problems Resolved Problems Electronic Signature(s) Signed: 06/06/2023 3:10:37 PM By: Bonnell Public Signed: 06/10/2023 5:28:05 PM By: Geralyn Corwin DO Previous Signature: 06/05/2023 4:29:46 PM Version By: Geralyn Corwin DO Entered By: Bonnell Public on 06/06/2023 14:03:29 -------------------------------------------------------------------------------- Progress Note Details Patient Name: Date of Service: [facility], [name]. 06/05/2023 2:00 PM Medical Record Number: [id number] Patient Account Number: [id number] Date of Birth/Sex: Treating RN: 11-19-1940 (82 y.o. [name], [name] Primary Care Provider: Barbette Reichmann Other Clinician: Referring Provider: Treating Provider/Extender: Adolph Pollack Weeks in Treatment: 4 Subjective Chief Complaint Information obtained from Patient Right heel wound, right hip wound, right ankle wound, left ankle wound, right lower extremity wound, left anterior lower extremity wound, left hip wound History of Present Illness (HPI) 82 year old patient who comes with a referral for bilateral lower extremity edema and a lower extremity ulceration and has been sent by her PCP [name]. I  understand the patient was recently put on amoxicillin and doxycycline but could not tolerate the amoxicillin. doxycycline course was completed. a BNP and EKG was supposed to be normal and the patient did not have any dyspnea. the patient has been on a diuretic. The patient was  also prescribed a pair of elastic compression stockings of the 20-30 mmHg pressure variety. x-ray of the right ankle was done on 09/20/2015 and showed posttraumatic and postsurgical changes of the right ankle with secondary degenerative changes of the tibiotalar joint and to a lesser degree the subtalar joint. No definite acute bony abnormalities are noted. Past medical history significant for diabetes mellitus, hypertension, hyperlipidemia, right breast cancer treated with a mastectomy in 2014. She has never smoked. [name], [name] ([id number]) [id number] Page 7 of 11 10/24/2015 -- she had delayed her vascular test because of her husband surgery but she is now ready to get him taken care of. He is also unable to use compression stockings and hence we will need to order her Juzo wraps. 10/31/2015-- was seen by Dr. Wyn Quaker on 10/28/2015. She had a left lower extremity arterial duplex done at his office a couple of years ago and that was essentially normal. Today they performed a venous duplex which revealed no evidence of deep vein thrombosis, superficial thrombophlebitis, no venous reflex was seen on the right and a minimal amount of reflux was seen on the left great saphenous vein but no significant reflux was seen. Impression was that there was a component of lymphedema present from a previous surgery and he would recommend compression stockings and leg elevation. Readmission: 07-24-2022 upon evaluation today patient presents for initial inspection here in our clinic concerning issues that she has been having with her legs this is actually been going on for several years according to what her family member with her today tells me as well as what the patient reiterates as well. She is currently most recently been seeing Dr. Ether Griffins and subsequently he had her in [place] boots. However 2 weeks ago he referred her to [place] and then subsequently took her out of the [place] boot wraps  at that point. At this time the left leg looks to be worse in the right leg currently. She is on Lasix and lisinopril with hydrochlorothiazide she has high blood pressure she also has issues currently with lower extremity swelling and edema which has been an ongoing issue for her as well. Patient does have a history of chronic venous hypertension, lymphedema, diabetes mellitus type 2, hypertension, peripheral vascular disease, and neuropathy. Currently she is on Lasix as well as lisinopril with hydrochlorothiazide. 08-02-2022 upon evaluation today patient presents for reevaluation the good news is she is actually doing somewhat better in regard to the wound and the overall appearance and sinuses. The unfortunate thing is her infection really is not significantly improved we did have to switch out her antibiotic once we got that final result back and I switched her to Levaquin and away from the doxycycline. Unfortunately the doxycycline had been doing poorly for her. In fact she had had diarrhea from the time she started taking it on Friday and she is still having it when she shows up today for evaluation. Again I was not aware of this and obviously she does appear to be somewhat dehydrated as well based on what I see. My concern which I discussed with the patient today is the possibility of a C. difficile infection. With that being said fact this started immediately upon  taking the doxycycline makes me think that it was just the medicine and is not completely out of her system yet despite having taken the last dose Tuesday morning. Nonetheless with what we are seeing currently I want to be sure that reason I Minna contact her primary care provider and see if a would be willing to see her and test for C. difficile infection. 08-07-2022 upon evaluation today patient appears to be doing well currently in regard to her wounds which are actually measuring much better this is great news. Fortunately I do not  see any signs of active infection locally or systemically at this time which is great as well. The good news is she was tested for a C. difficile infection and it was negative. I am very pleased and thankful for primary care provider for doing that so this means that she was just having a severe reaction to the doxycycline we have added that to her allergy list at this point. 08-14-2022 upon evaluation today patient appears to be doing well currently in regard to her dehydration she is actually significantly improved compared to last time I saw her last week. With that being said I do not see any evidence of active infection systemically at this time which is great news. However locally she still does appear to have cellulitis in left lower extremity. I discussed with her today that I do believe she would benefit from going ahead and starting the Levaquin. Again we will concerned about the C. difficile infection of the diarrhea but that this turned out to be just an issue with a reaction to the doxycycline. My hope is that the Levaquin will not cause her any complication and will be able to treat the infection. I did review her arterial study as well which was dated on 07-31-2022. It showed that she had a ABI on the right of 1.30 and on the left of 1.27 with a TBI on the right of 1.12 and the left 1.21 this is a normal arterial study. Again on the patient's wound culture she actually did show evidence of Proteus, Morganella, and MRSA. Levaquin is a good option here across the board. 09/26/2022 Ms. Diane Bignell is a 82 year old female with a past medical history of type 2 diabetes currently controlled on oral agents, venous insufficiency/lymphedema, right breast cancer and chronic diastolic heart failure that presents to the clinic for a 1 month history of nonhealing wounds to the left lower extremity, right heel and left buttocks. She states that the buttocks wound and the right heel wound developed  while she was in the hospital. She was admitted on 08/22/2022 for severe sepsis secondary to acute sigmoid and distal colonic diverticulitis. At that time it was noted she had a stage I decubitus ulcer to her bilateral buttocks. A wound to the heel was not mentioned. She currently denies systemic signs of infection. She came into clinic in a blue gown with no undergarments. She has not been dressing the wounds. She states she just recently obtained home health and they are coming out for the first time this week. She is seen in our clinic often for lower extremity wounds secondary to venous insufficiency. 11/22; patient presents for follow-up. Son is present during the encounter. Per son it sounds like they are not doing any dressing changes. She states she has home health but they have not come out. She is going to let us know which home health agency she has been approved for so we  can send orders. Currently she denies signs of infection. 12/13; patient presents for follow-up. Patient has home health and they are coming out once a week. It appears that there has not been any dressing changes except for with home health. Patient has a newly discovered wound to the left foot. There is exposed bone. There is slight erythema and increased warmth to the surrounding tissue. Patient is completely unaware of this wound. 12/20; patient presents for follow-up. She has been taking her oral antibiotics. She now has an eschar and wound to the right hip. She has been using Dakin's wet-to-dry dressings to the left foot wound and buttocks wound. She has been using Medihoney and silver alginate to the right heel wound. She currently denies systemic signs of infection. She is mainly bedbound or in a wheelchair. 1/10; patient presents for follow-up. Patient had her left second toenail removed by Dr. Ether Griffins, podiatry on 12/21. Unfortunately she did not feel well and she was admitted to the hospital for sepsis. Her  right heel wound was thought to be infected and this was debrided in the OR by Dr. Ether Griffins. Culture and bone biopsy had no growth. She has been using Medihoney to all the wound beds except for the lefty buttocks wound for which she uses Dakin's wet-to-dry dressings. She has developed a pressure injury to the left heel now. This is despite having Prevalon boots. Although she is not wearing them today. She has completed 4 weeks of Augmentin and also a week of doxycycline for osteomyelitis of the left foot. Her left foot wound no longer probes to bone. Currently she denies signs of infection. 1/24; patient presents for follow-up. She has been using Medihoney and Hydrofera Blue to the wound beds except for this buttocks wound she has been using Dakin's wet-to-dry dressings. She has developed 2 new wounds 1 to the left heel and the other to the right medial ankle. She claims she is wearing her Prevalon boots all the time although she does not have them on today. She currently denies signs of infection. 2/21; patient presents for follow-up. She was recently hospitalized on 12/19/2022 for sepsis secondary to Enterococcus bacillus bacteremia. She was treated with IV antibiotics and has completed her discharge oral antibiotics. She had a CT scanning of the abdomen/pelvis without acute abnormalities. Husband is present with patient today. They have been using Dakin's wet-to-dry dressings to the sacral wound and hip wound and Hydrofera Blue and Medihoney to the feet wounds. They are not changing the dressings daily. It is unclear how often they actually change the dressings. She has been wearing her Prevalon boots at night but not during the day. She currently denies systemic signs of infection. 3/20; patient presents for follow-up. She again was in the hospital for severe sepsis on 2/24; MRIs at that time showed findings suspicious for osteomyelitis to the right hip. She is currently on Augmentin to complete 4  weeks of this. She is following with ID. She has been using Dakin's wet-to-dry dressings to the sacrum and right hip however the solution is starting to cause discomfort. T the rest of the wound she has been using Hydrofera Blue and Medihoney. Patient o and son are not interested in doing palliative care or hospice. They state that they had discussions while in the hospital. 4/17; patient presents for follow-up. She has been using Vashe wet-to-dry dressings to the sacrum and right trochanter wound. She has been using Medihoney and Hydrofera Blue to the bilateral feet wounds. She has  no issues or complaints today. She denies signs of infection. 5/1; patient developed a new wound to the right leg. Son was present and states that there was a scab and he removed it creating a wound. He has been keeping the area covered. T the sacral and right trochanter wound they have been using Vashe wet-to-dry dressings and Medihoney and Hydrofera Blue to the o bilateral feet wounds. She denies signs of infection. [name], [name] ([id number]) [id number] Page 8 of 11 6/1; 1 month follow-up. She has a new area on the left anterior lower leg. In the meantime the sacral ulcer is closed. She still has an extensive wound on the right hip with exposed tendon over the greater trochanter. On the lower extremities the left anterior lower leg wound is new right ankle left heel and right heel all look reasonably stable. We are using Vashe wet-to-dry on the hip Medihoney and Hydrofera Blue on the lower extremities. She is accompanied by her son I think who is doing the dressings. They do not have a pressure-relief surface at home but they do have a hospital bed 7/10; I have not seen this patient since 03/13/2023. She was scheduled to be seen on 5/29 however missed her appointment due to illness. She was seen on 6/5 by Dr. Leanord Hawking and she presents today for her follow-up. Unfortunately her wounds have  declined since I last saw her. She has several new wounds including one to the left hip wound and one left anterior lower leg. The right hip wound has declined and has maggots with bone exposed. She has been using Vashe wet- to-dry dressings to Right hip wound and Medihoney and Hydrofera Blue to the The remaining wounds. Sacral wound remains closed. 7/24; patient presents for follow-up. At last clinic visit I recommended she go to the ED for potential admission, IV antibiotics and further imaging. She had MRI of the pelvis and left foot that showed osteomyelitis. Podiatry advised conservative management. She was also seen by infectious disease who discharged patient on 2 weeks of linezolid and 2 weeks of ciprofloxacin. Patient is at home and has home health. Palliative care was consulted during the stay and she is full code and has declined further outpatient palliative care and hospice services.. She currently denies systemic signs of infection. Overall there is improvement in the appearance of the wounds. There is healthier granulation tissue present. However this is overall poor prognosis case. She has Prevalon boots but it is unclear if she is using these. She does not have them on today. She follows up with infectious disease next week. Objective Constitutional Vitals Time Taken: 1:58 PM, Weight: 116 lbs, Temperature: 98.2 F, Pulse: 78 bpm, Respiratory Rate: 18 breaths/min, Blood Pressure: 95/57 mmHg. General Notes: T the right There is an open wound that probes straight to bone with maggots present. T the left trochanter there is an unstageable wound. T o o o the lower extremities and feet bilaterally there are scattered open wounds with granulation tissue and nonviable tissue. No obvious soft tissue infection to the lower extremities. Integumentary (Hair, Skin) Wound #11 status is Open. Original cause of wound was Pressure Injury. The date acquired was: 10/24/2022. The wound has been in  treatment 31 weeks. The wound is located on the Right Trochanter. The wound measures 3cm length x 4cm width x 3cm depth; 9.425cm^2 area and 28.274cm^3 volume. There is muscle, tendon, Fat Layer (Subcutaneous Tissue), and fascia exposed. There is undermining starting at 12:00 and ending at 12:00  with a maximum distance of 4.3cm. There is a medium amount of serosanguineous drainage noted. The wound margin is well defined and not attached to the wound base. There is large (67- 100%) red granulation within the wound bed. There is no necrotic tissue within the wound bed. Wound #12 status is Converted. Original cause of wound was Pressure Injury. The date acquired was: 12/05/2022. The wound has been in treatment 26 weeks. The wound is located on the Right,Medial Ankle. The wound measures 0cm length x 0cm width x 0cm depth; 0cm^2 area and 0cm^3 volume. There is a medium amount of serosanguineous drainage noted. The wound margin is flat and intact. There is no granulation within the wound bed. There is a large (67-100%) amount of necrotic tissue within the wound bed including Eschar and Adherent Slough. Wound #13 status is Open. Original cause of wound was Pressure Injury. The date acquired was: 11/28/2022. The wound has been in treatment 26 weeks. The wound is located on the Left Calcaneus. The wound measures 2cm length x 2cm width x 0.2cm depth; 3.142cm^2 area and 0.628cm^3 volume. There is Fat Layer (Subcutaneous Tissue) exposed. There is a medium amount of serosanguineous drainage noted. The wound margin is flat and intact. There is medium (34-66%) pink granulation within the wound bed. There is a medium (34-66%) amount of necrotic tissue within the wound bed including Eschar and Adherent Slough. Wound #14 status is Open. Original cause of wound was Gradually Appeared. The date acquired was: 03/10/2023. The wound has been in treatment 12 weeks. The wound is located on the Right,Distal,Medial Lower Leg. The  wound measures 11.5cm length x 7cm width x 0.3cm depth; 63.225cm^2 area and 18.967cm^3 volume. There is Fat Layer (Subcutaneous Tissue) exposed. There is a large amount of serosanguineous drainage noted. There is large (67-100%) red granulation within the wound bed. There is a small (1-33%) amount of necrotic tissue within the wound bed including Adherent Slough. Wound #15 status is Open. Original cause of wound was Pressure Injury. The date acquired was: 04/11/2023. The wound has been in treatment 7 weeks. The wound is located on the Left,Anterior Lower Leg. The wound measures 6.5cm length x 3cm width x 0.2cm depth; 15.315cm^2 area and 3.063cm^3 volume. There is tendon and Fat Layer (Subcutaneous Tissue) exposed. There is no tunneling or undermining noted. There is a medium amount of serosanguineous drainage noted. There is large (67-100%) red granulation within the wound bed. There is a small (1-33%) amount of necrotic tissue within the wound bed including Adherent Slough. Wound #16 status is Open. Original cause of wound was Pressure Injury. The date acquired was: 05/22/2023. The wound has been in treatment 2 weeks. The wound is located on the Left Trochanter. The wound measures 1.5cm length x 2.5cm width x 0.1cm depth; 2.945cm^2 area and 0.295cm^3 volume. There is Fat Layer (Subcutaneous Tissue) exposed. There is a medium amount of serosanguineous drainage noted. There is a large (67-100%) amount of necrotic tissue within the wound bed including Eschar and Adherent Slough. Wound #17 status is Converted. Original cause of wound was Pressure Injury. The date acquired was: 05/22/2023. The wound has been in treatment 2 weeks. The wound is located on the Left,Anterior Lower Leg. The wound measures 0cm length x 0cm width x 0cm depth; 0cm^2 area and 0cm^3 volume. There is a medium amount of serosanguineous drainage noted. Wound #6 status is Open. Original cause of wound was Pressure Injury. The date  acquired was: 07/23/2022. The wound has been in treatment 36  weeks. The wound is located on the Right Calcaneus. The wound measures 4cm length x 2.6cm width x 0.3cm depth; 8.168cm^2 area and 2.45cm^3 volume. There is Fat Layer (Subcutaneous Tissue) exposed. There is a medium amount of serosanguineous drainage noted. The wound margin is distinct with the outline attached to the wound base. There is medium (34-66%) red, pink granulation within the wound bed. There is a medium (34-66%) amount of necrotic tissue within the wound bed including Adherent Slough. Assessment Active Problems ICD-10 [name], [name] ([id number]) [id number] Page 9 of 11 Non-pressure chronic ulcer of other part of left lower leg with fat layer exposed Non-pressure chronic ulcer of other part of right lower leg with fat layer exposed Chronic venous hypertension (idiopathic) with ulcer of left lower extremity Lymphedema, not elsewhere classified Type 2 diabetes mellitus with other skin ulcer Chronic diastolic (congestive) heart failure Pressure ulcer of left buttock, unstageable Pressure ulcer of right heel, unstageable Unspecified open wound, left foot, initial encounter Other acute osteomyelitis, left ankle and foot Pressure ulcer of right hip, unstageable Pressure ulcer of right ankle, stage 3 Pressure ulcer of left heel, unstageable Pressure ulcer of left hip, unstageable Unspecified open wound, left lower leg, initial encounter Long term (current) use of oral hypoglycemic drugs Overall patient's prognosis is poor secondary to not being mobile for 2 years, poor nutrition intake and inability to offload the wound beds. She was hospitalized after being seen at last clinic visit and noted to have osteomyelitis to the left calcaneus and right trochanter. She was seen by infectious disease who discharged her on oral antibiotics. She is a poor surgical candidate. Overall wounds have improved  since I last saw her. I recommended Vashe wet-to- dry dressings to all wound beds except for the left hip which has an eschar. She can use Santyl here. She states she has this at home. She will follow-up with infectious disease next week. Follow-up in 2 weeks In our clinic. Plan 1. Vashe wet-to-dry dressings 2. Aggressive offloadingPrevalon boots, order air mattress 3. Santyl 4. Follow-up with infectious disease 5. Follow-up in the wound care clinic in 2 weeks Electronic Signature(s) Signed: 06/05/2023 4:29:46 PM By: Geralyn Corwin DO Entered By: Geralyn Corwin on 06/05/2023 14:47:10 -------------------------------------------------------------------------------- ROS/PFSH Details Patient Name: Date of Service: [facility], [name]. 06/05/2023 2:00 PM Medical Record Number: [id number] Patient Account Number: [id number] Date of Birth/Sex: Treating RN: March 05, 1941 (82 y.o. [name], [name] Primary Care Provider: Barbette Reichmann Other Clinician: Referring Provider: Treating Provider/Extender: Adolph Pollack Weeks in Treatment: 32 Information Obtained From Patient Eyes Medical History: Positive for: Cataracts - removed 2 years ago Negative for: Glaucoma; Optic Neuritis Ear/Nose/Mouth/Throat Medical History: Negative for: Chronic sinus problems/congestion; Middle ear problems Hematologic/Lymphatic KAZARIA, MCKELL ([id number]) [id number] Page 10 of 11 Medical History: Positive for: Lymphedema Negative for: Anemia; Hemophilia; Human Immunodeficiency Virus; Sickle Cell Disease Respiratory Medical History: Negative for: Aspiration; Asthma; Chronic Obstructive Pulmonary Disease (COPD); Pneumothorax; Sleep Apnea; Tuberculosis Cardiovascular Medical History: Positive for: Hypertension; Peripheral Arterial Disease; Peripheral Venous Disease Gastrointestinal Medical History: Negative for: Cirrhosis ; Colitis; Crohns; Hepatitis A;  Hepatitis B; Hepatitis C Endocrine Medical History: Positive for: Type II Diabetes Time with diabetes: 8years Treated with: Oral agents Blood sugar tested every day: No Genitourinary Medical History: Past Medical History Notes: Hx of Kidney stones, hysterectomy Musculoskeletal Medical History: Positive for: Osteoarthritis Past Medical History Notes: Rotator cuff disorder Neurologic Medical History: Positive for: Neuropathy Negative for: Dementia; Quadriplegia; Paraplegia; Seizure Disorder Oncologic Medical History: Past Medical  History Notes: Breast Ca Psychiatric Medical History: Negative for: Anorexia/bulimia; Confinement Anxiety HBO Extended History Items Eyes: Cataracts Immunizations Pneumococcal Vaccine: Received Pneumococcal Vaccination: Yes Received Pneumococcal Vaccination On or After 60th Birthday: Yes Implantable Devices None Family and Social History Cancer: No; Diabetes: No; Heart Disease: Yes - Mother,Father; Hereditary Spherocytosis: No; Hypertension: Yes - Mother,Father; Kidney Disease: No; Lung Disease: No; Seizures: No; Stroke: Yes - Mother,Father; Thyroid Problems: No; Tuberculosis: No; Never smoker; Marital Status - Married; Alcohol Use: Never; Drug Use: No History; Caffeine Use: Daily; Financial Concerns: No; Food, Clothing or Shelter Needs: No; Support System Lacking: No; Transportation Concerns: No Electronic Signature(s) BETHANNY, EICK ([id number]) [id number] Page 11 of 11 Signed: 06/05/2023 4:29:46 PM By: Geralyn Corwin DO Signed: 06/05/2023 4:46:32 PM By: Bonnell Public Entered By: Geralyn Corwin on 06/05/2023 14:52:09 -------------------------------------------------------------------------------- SuperBill Details Patient Name: Date of Service: [facility], [name]. 06/05/2023 Medical Record Number: [id number] Patient Account Number: [id number] Date of Birth/Sex: Treating RN: [birth year] (82 y.o. [name],  [name] Primary Care Provider: Barbette Reichmann Other Clinician: Referring Provider: Treating Provider/Extender: Adolph Pollack Weeks in Treatment: 36 Diagnosis Coding ICD-10 Codes Code Description [phone] Non-pressure chronic ulcer of other part of left lower leg with fat layer exposed L97.812 Non-pressure chronic ulcer of other part of right lower leg with fat layer exposed I87.312 Chronic venous hypertension (idiopathic) with ulcer of left lower extremity I89.0 Lymphedema, not elsewhere classified E11.622 Type 2 diabetes mellitus with other skin ulcer I50.32 Chronic diastolic (congestive) heart failure L89.320 Pressure ulcer of left buttock, unstageable L89.610 Pressure ulcer of right heel, unstageable S91.302A Unspecified open wound, left foot, initial encounter M86.172 Other acute osteomyelitis, left ankle and foot L89.210 Pressure ulcer of right hip, unstageable L89.513 Pressure ulcer of right ankle, stage 3 L89.620 Pressure ulcer of left heel, unstageable L89.220 Pressure ulcer of left hip, unstageable S81.802A Unspecified open wound, left lower leg, initial encounter Z79.84 Long term (current) use of oral hypoglycemic drugs Facility Procedures : CPT4 Code: [id number] Description: 99213 - WOUND CARE VISIT-LEV 3 EST PT Modifier: Quantity: 1 Physician Procedures : CPT4 Code Description Modifier [id number] - WC PHYS LEVEL 3 - EST PT ICD-10 Diagnosis Description L97.822 Non-pressure chronic ulcer of other part of left lower leg with fat layer exposed L97.812 Non-pressure chronic ulcer of other part of right  lower leg with fat layer exposed L89.210 Pressure ulcer of right hip, unstageable L89.220 Pressure ulcer of left hip, unstageable Quantity: 1 Electronic Signature(s) Signed: 06/05/2023 4:46:32 PM By: Bonnell Public Signed: 06/10/2023 5:28:05 PM By: Geralyn Corwin DO Previous Signature: 06/05/2023 4:29:46 PM Version By: Geralyn Corwin DO Entered By:  Bonnell Public on 06/05/2023 16:42:21

## 2023-06-12 DIAGNOSIS — E1143 Type 2 diabetes mellitus with diabetic autonomic (poly)neuropathy: Secondary | ICD-10-CM | POA: Diagnosis not present

## 2023-06-12 DIAGNOSIS — E440 Moderate protein-calorie malnutrition: Secondary | ICD-10-CM | POA: Diagnosis not present

## 2023-06-12 DIAGNOSIS — E1122 Type 2 diabetes mellitus with diabetic chronic kidney disease: Secondary | ICD-10-CM | POA: Diagnosis not present

## 2023-06-12 DIAGNOSIS — I4892 Unspecified atrial flutter: Secondary | ICD-10-CM | POA: Diagnosis not present

## 2023-06-12 DIAGNOSIS — E1142 Type 2 diabetes mellitus with diabetic polyneuropathy: Secondary | ICD-10-CM | POA: Diagnosis not present

## 2023-06-12 DIAGNOSIS — E11622 Type 2 diabetes mellitus with other skin ulcer: Secondary | ICD-10-CM | POA: Diagnosis not present

## 2023-06-12 DIAGNOSIS — E1151 Type 2 diabetes mellitus with diabetic peripheral angiopathy without gangrene: Secondary | ICD-10-CM | POA: Diagnosis not present

## 2023-06-12 DIAGNOSIS — E1169 Type 2 diabetes mellitus with other specified complication: Secondary | ICD-10-CM | POA: Diagnosis not present

## 2023-06-12 DIAGNOSIS — L97829 Non-pressure chronic ulcer of other part of left lower leg with unspecified severity: Secondary | ICD-10-CM | POA: Diagnosis not present

## 2023-06-12 DIAGNOSIS — F1120 Opioid dependence, uncomplicated: Secondary | ICD-10-CM | POA: Diagnosis not present

## 2023-06-12 DIAGNOSIS — I700 Atherosclerosis of aorta: Secondary | ICD-10-CM | POA: Diagnosis not present

## 2023-06-12 DIAGNOSIS — C50919 Malignant neoplasm of unspecified site of unspecified female breast: Secondary | ICD-10-CM | POA: Diagnosis not present

## 2023-06-13 ENCOUNTER — Ambulatory Visit: Payer: PPO | Attending: Infectious Diseases | Admitting: Infectious Diseases

## 2023-06-13 ENCOUNTER — Encounter: Payer: Self-pay | Admitting: Infectious Diseases

## 2023-06-13 VITALS — BP 96/63 | HR 92 | Temp 96.9°F

## 2023-06-13 DIAGNOSIS — A4902 Methicillin resistant Staphylococcus aureus infection, unspecified site: Secondary | ICD-10-CM | POA: Diagnosis not present

## 2023-06-13 DIAGNOSIS — L89219 Pressure ulcer of right hip, unspecified stage: Secondary | ICD-10-CM | POA: Diagnosis not present

## 2023-06-13 DIAGNOSIS — Z853 Personal history of malignant neoplasm of breast: Secondary | ICD-10-CM | POA: Insufficient documentation

## 2023-06-13 DIAGNOSIS — L89629 Pressure ulcer of left heel, unspecified stage: Secondary | ICD-10-CM | POA: Diagnosis not present

## 2023-06-13 DIAGNOSIS — I10 Essential (primary) hypertension: Secondary | ICD-10-CM | POA: Diagnosis not present

## 2023-06-13 DIAGNOSIS — Z79899 Other long term (current) drug therapy: Secondary | ICD-10-CM | POA: Diagnosis not present

## 2023-06-13 DIAGNOSIS — L89619 Pressure ulcer of right heel, unspecified stage: Secondary | ICD-10-CM | POA: Diagnosis not present

## 2023-06-13 DIAGNOSIS — L89150 Pressure ulcer of sacral region, unstageable: Secondary | ICD-10-CM | POA: Diagnosis not present

## 2023-06-13 DIAGNOSIS — E8809 Other disorders of plasma-protein metabolism, not elsewhere classified: Secondary | ICD-10-CM | POA: Diagnosis not present

## 2023-06-13 DIAGNOSIS — M159 Polyosteoarthritis, unspecified: Secondary | ICD-10-CM | POA: Diagnosis not present

## 2023-06-13 DIAGNOSIS — E46 Unspecified protein-calorie malnutrition: Secondary | ICD-10-CM | POA: Diagnosis not present

## 2023-06-13 DIAGNOSIS — R531 Weakness: Secondary | ICD-10-CM | POA: Diagnosis not present

## 2023-06-13 DIAGNOSIS — Z978 Presence of other specified devices: Secondary | ICD-10-CM | POA: Diagnosis not present

## 2023-06-13 DIAGNOSIS — T07XXXA Unspecified multiple injuries, initial encounter: Secondary | ICD-10-CM | POA: Insufficient documentation

## 2023-06-13 DIAGNOSIS — D649 Anemia, unspecified: Secondary | ICD-10-CM | POA: Insufficient documentation

## 2023-06-13 DIAGNOSIS — X58XXXA Exposure to other specified factors, initial encounter: Secondary | ICD-10-CM | POA: Insufficient documentation

## 2023-06-13 DIAGNOSIS — E1165 Type 2 diabetes mellitus with hyperglycemia: Secondary | ICD-10-CM | POA: Diagnosis not present

## 2023-06-13 DIAGNOSIS — R339 Retention of urine, unspecified: Secondary | ICD-10-CM | POA: Diagnosis not present

## 2023-06-13 DIAGNOSIS — B965 Pseudomonas (aeruginosa) (mallei) (pseudomallei) as the cause of diseases classified elsewhere: Secondary | ICD-10-CM

## 2023-06-13 DIAGNOSIS — L89159 Pressure ulcer of sacral region, unspecified stage: Secondary | ICD-10-CM | POA: Diagnosis not present

## 2023-06-13 NOTE — Progress Notes (Signed)
NAME: [name]  DOB: July 04, 1941  MRN: [id number]  Date/Time: 06/13/2023 11:09 AM   Subjective:   Pt is here with her son for follow up of multiple pressure wounds She was in [facility] between 05/22/2023 until 05/31/2023 She is currently on linezolid for MRSA infection of the wounds and ciprofloxacin for Pseudomonas. She is also followed at the wound clinic and saw them in April  [name] is a 82 y.o. 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  She was hospitalized again in July with multiple wounds after being sent from the wound clinic and culture had MRSA Patient is doing much better She has no side effects from medications Her appetite is still poor Her son is doing the dressing along with the nurse She is in a wheelchair  Past Medical History:  Diagnosis Date   Arthritis    Arthritis    Breast cancer (HCC) 1992   right breast with lumpectomy and rad tx   Cancer of right breast (HCC) 04/16/2013   right breast with mastectomy   Diabetes mellitus without complication (HCC)    Gout    High cholesterol    Hyperlipidemia    Hypertension    Personal history of radiation therapy     Past Surgical History:  Procedure Laterality Date   ABDOMINAL HYSTERECTOMY     ANKLE FRACTURE SURGERY Right 2004   BREAST LUMPECTOMY Right 1992   positive   IRRIGATION AND DEBRIDEMENT FOOT Right 11/02/2022   Procedure: IRRIGATION AND DEBRIDEMENT RIGHT FOOT AND BONE BIOPSY RIGHT FOOT;  Surgeon: Gwyneth Revels, DPM;  Location: [facility] ORS;  Service: Podiatry;  Laterality:  Right;   IVC FILTER INSERTION N/A 11/06/2022   Procedure: IVC FILTER INSERTION;  Surgeon: Annice Needy, MD;  Location: [facility];  Service: Cardiovascular;  Laterality: N/A;   MASTECTOMY Right 2014   PERIPHERAL VASCULAR THROMBECTOMY Left 11/06/2022   Procedure: PERIPHERAL VASCULAR THROMBECTOMY- EXTERNAL Iliac Vein/CFV;  Surgeon: Annice Needy, MD;  Location: [facility];  Service: Cardiovascular;  Laterality: Left;   SHOULDER SURGERY Right 2010    Social History   Socioeconomic History   Marital status: Married    Spouse name: Not on file   Number of children: 2   Years of education: Not on file   Highest education level: Not on file  Occupational History   Not on file  Tobacco Use   Smoking status: Never    Passive exposure: Never   Smokeless tobacco: Never  Vaping Use   Vaping status: Never Used  Substance and Sexual Activity   Alcohol use: No    Alcohol/week: 0.0 standard drinks of alcohol   Drug use: No   Sexual activity: Not Currently  Other Topics Concern   Not on file  Social History Narrative   Not on file   Social Determinants of Health   Financial Resource Strain: Not on file  Food Insecurity: No Food Insecurity (05/22/2023)   Hunger Vital Sign    Worried About Running Out of Food in the Last Year: Never true  Ran Out of Food in the Last Year: Never true  Transportation Needs: No Transportation Needs (05/22/2023)   PRAPARE - Administrator, Civil Service (Medical): No    Lack of Transportation (Non-Medical): No  Physical Activity: Not on file  Stress: Not on file  Social Connections: Not on file  Intimate Partner Violence: Not At Risk (05/22/2023)   Humiliation, Afraid, Rape, and Kick questionnaire    Fear of Current or Ex-Partner: No    Emotionally Abused: No    Physically Abused: No    Sexually Abused: No    Family History  Problem Relation Age of Onset   Diabetes Father    Breast cancer Neg Hx    Allergies   Allergen Reactions   Other Anaphylaxis    Anesthisia has been a health issue for her in the past.    Sulfa Antibiotics Rash   I? Current Outpatient Medications  Medication Sig Dispense Refill   acetaminophen (TYLENOL) 500 MG tablet Take 500-1,000 mg by mouth 4 (four) times daily as needed for mild pain or moderate pain.     allopurinol (ZYLOPRIM) 300 MG tablet Take 300 mg by mouth daily.  11   ascorbic acid (VITAMIN C) 500 MG tablet Take 1 tablet (500 mg total) by mouth 2 (two) times daily. 30 tablet 0   aspirin EC 81 MG tablet Take 1 tablet (81 mg total) by mouth daily. Swallow whole. 30 tablet 12   atenolol (TENORMIN) 50 MG tablet Take 50 mg by mouth daily.     atorvastatin (LIPITOR) 40 MG tablet Take 40 mg by mouth daily.      ciprofloxacin (CIPRO) 250 MG tablet Take 1 tablet (250 mg total) by mouth 2 (two) times daily for 14 days. 28 tablet 0   diphenoxylate-atropine (LOMOTIL) 2.5-0.025 MG tablet Take 1-2 tablets by mouth 3 (three) times daily as needed.     ferrous sulfate 325 (65 FE) MG tablet Take 1 tablet (325 mg total) by mouth 2 (two) times daily with a meal. 60 tablet 3   gabapentin (NEURONTIN) 100 MG capsule TAKE 1 CAPSULE BY MOUTH ONCE DAILY FOR 7 DAYS THEN TAKE 1 CAPSULES BY MOUTH TWICE DAILY     gentamicin cream (GARAMYCIN) 0.1 % Apply 1 Application topically in the morning and at bedtime.     HYDROcodone-acetaminophen (NORCO) 7.5-325 MG tablet Take 1 tablet by mouth 2 (two) times daily as needed for moderate pain or severe pain.     levothyroxine (SYNTHROID) 88 MCG tablet Take 88 mcg by mouth daily.     linezolid (ZYVOX) 600 MG tablet Take 1 tablet (600 mg total) by mouth 2 (two) times daily. 28 tablet 0   metFORMIN (GLUCOPHAGE) 1000 MG tablet Take 1,000 mg by mouth in the morning and at bedtime.     Multiple Vitamin (MULTIVITAMIN WITH MINERALS) TABS tablet Take 1 tablet by mouth daily.     polyethylene glycol (MIRALAX / GLYCOLAX) 17 g packet Take 17 g by mouth daily. 14  each 0   sitaGLIPtin (JANUVIA) 50 MG tablet Take 50 mg by mouth daily.     thiamine (VITAMIN B-1) 100 MG tablet Take 1 tablet (100 mg total) by mouth daily. 30 tablet 0   Vaginal Moisturizer (REPLENS EXTERNAL COMFORT) GEL Apply 1 application  topically daily. 42.5 g 11   vitamin B-12 (CYANOCOBALAMIN) 1000 MCG tablet Take 1,000 mcg by mouth daily.     zinc sulfate 220 (50 Zn) MG capsule Take 1 capsule (220 mg  total) by mouth daily. 30 capsule 0   No current facility-administered medications for this visit.     Abtx:  Anti-infectives (From admission, onward)    None       REVIEW OF SYSTEMS:  Const: negative fever, negative chills, weight loss and Eyes: negative diplopia or visual changes, negative eye pain ENT: negative coryza, negative sore throat Resp: negative cough, hemoptysis, dyspnea Cards: negative for chest pain, palpitations, lower extremity edema GU: negative for frequency, dysuria and hematuria GI: Negative for abdominal pain, diarrhea, bleeding, constipation Skin: Multiple wounds Heme: negative for easy bruising and gum/nose bleeding YN:[id number] weakness Neurolo:dizziness  Psych: negative for feelings of anxiety, depression  Endocrine: diabetes Allergy/Immunology- sulfa Objective:  VITALS:  BP 96/63   Pulse 92   Temp (!) 96.9 F (36.1 C) (Temporal)   PHYSICAL EXAM:  General: Alert, cooperative, no distress, frail, pale  Head: Normocephalic, without obvious abnormality, atraumatic. Eyes: Conjunctivae clear, anicteric sclerae. Pupils are equal ENT Nares normal. No drainage or sinus tenderness. Lips, mucosa, and tongue normal. No Thrush Edentulous Neck:, symmetrical, no adenopathy, thyroid: non tender no carotid bruit and no JVD.  Skin multiple wounds Right heel  Left heel  Left leg  Right leg     Lungs: Clear to auscultation bilaterally. No Wheezing or Rhonchi. No rales. Heart: Regular rate and rhythm, no murmur, rub or gallop. Abdomen: did  not examine as in wheel chair Foley in place Lymph: Cervical, supraclavicular normal. Neurologic: Grossly non-focal Pertinent Labs Patient to get labs today  ? Impression/Recommendation ? Multiple pressure ulcers of the  sacrum, right hip at the greater trochanter area, bilateral heels infected with MRSA and Pseudomonas patient is on linezolid and ciprofloxacin for the last 2 weeks since doing very well Although much improved than before She will need to do labs today CBC CMP ESR and CRP and depending on the labs may extend antibiotic by another week She is followed at the wound clinic and need to go back to them  Anemia  Urinary retention has Foley followed by urology  Malnutrition/hypoalbuminemia explained to patient that she needs to improve her high nutrition for the wounds to heal  H/o ca breast- rt mastectomy  H/o 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 ___________________________________________________ Discussed with patient, and her son Explained them that for the wound to heal she needs adequate nutrition to improve her albumin, hemoglobin and also to offload the pressure on the wounds and adequate topical care Follow-up in 1 month Note:  This document was prepared using Dragon voice recognition software and may include unintentional dictation errors.

## 2023-06-14 DIAGNOSIS — L89892 Pressure ulcer of other site, stage 2: Secondary | ICD-10-CM | POA: Diagnosis not present

## 2023-06-14 DIAGNOSIS — L89890 Pressure ulcer of other site, unstageable: Secondary | ICD-10-CM | POA: Diagnosis not present

## 2023-06-14 DIAGNOSIS — L89610 Pressure ulcer of right heel, unstageable: Secondary | ICD-10-CM | POA: Diagnosis not present

## 2023-06-14 DIAGNOSIS — R8290 Unspecified abnormal findings in urine: Secondary | ICD-10-CM | POA: Diagnosis not present

## 2023-06-14 DIAGNOSIS — L89613 Pressure ulcer of right heel, stage 3: Secondary | ICD-10-CM | POA: Diagnosis not present

## 2023-06-14 DIAGNOSIS — L89154 Pressure ulcer of sacral region, stage 4: Secondary | ICD-10-CM | POA: Diagnosis not present

## 2023-06-14 DIAGNOSIS — L89213 Pressure ulcer of right hip, stage 3: Secondary | ICD-10-CM | POA: Diagnosis not present

## 2023-06-19 ENCOUNTER — Encounter: Payer: PPO | Attending: Internal Medicine | Admitting: Internal Medicine

## 2023-06-19 DIAGNOSIS — E11621 Type 2 diabetes mellitus with foot ulcer: Secondary | ICD-10-CM | POA: Insufficient documentation

## 2023-06-19 DIAGNOSIS — L97822 Non-pressure chronic ulcer of other part of left lower leg with fat layer exposed: Secondary | ICD-10-CM | POA: Diagnosis not present

## 2023-06-19 DIAGNOSIS — L03116 Cellulitis of left lower limb: Secondary | ICD-10-CM | POA: Insufficient documentation

## 2023-06-19 DIAGNOSIS — L89220 Pressure ulcer of left hip, unstageable: Secondary | ICD-10-CM | POA: Insufficient documentation

## 2023-06-19 DIAGNOSIS — L97812 Non-pressure chronic ulcer of other part of right lower leg with fat layer exposed: Secondary | ICD-10-CM | POA: Insufficient documentation

## 2023-06-19 DIAGNOSIS — I890 Lymphedema, not elsewhere classified: Secondary | ICD-10-CM | POA: Insufficient documentation

## 2023-06-19 DIAGNOSIS — L89620 Pressure ulcer of left heel, unstageable: Secondary | ICD-10-CM | POA: Insufficient documentation

## 2023-06-19 DIAGNOSIS — M1990 Unspecified osteoarthritis, unspecified site: Secondary | ICD-10-CM | POA: Insufficient documentation

## 2023-06-19 DIAGNOSIS — E785 Hyperlipidemia, unspecified: Secondary | ICD-10-CM | POA: Insufficient documentation

## 2023-06-19 DIAGNOSIS — L89610 Pressure ulcer of right heel, unstageable: Secondary | ICD-10-CM | POA: Diagnosis not present

## 2023-06-19 DIAGNOSIS — E1151 Type 2 diabetes mellitus with diabetic peripheral angiopathy without gangrene: Secondary | ICD-10-CM | POA: Diagnosis not present

## 2023-06-19 DIAGNOSIS — M86172 Other acute osteomyelitis, left ankle and foot: Secondary | ICD-10-CM | POA: Diagnosis not present

## 2023-06-19 DIAGNOSIS — E11622 Type 2 diabetes mellitus with other skin ulcer: Secondary | ICD-10-CM | POA: Diagnosis not present

## 2023-06-19 DIAGNOSIS — L89320 Pressure ulcer of left buttock, unstageable: Secondary | ICD-10-CM | POA: Insufficient documentation

## 2023-06-19 DIAGNOSIS — I110 Hypertensive heart disease with heart failure: Secondary | ICD-10-CM | POA: Insufficient documentation

## 2023-06-19 DIAGNOSIS — L89214 Pressure ulcer of right hip, stage 4: Secondary | ICD-10-CM | POA: Diagnosis not present

## 2023-06-19 DIAGNOSIS — I5032 Chronic diastolic (congestive) heart failure: Secondary | ICD-10-CM | POA: Diagnosis not present

## 2023-06-19 DIAGNOSIS — I872 Venous insufficiency (chronic) (peripheral): Secondary | ICD-10-CM | POA: Diagnosis not present

## 2023-06-19 DIAGNOSIS — E1140 Type 2 diabetes mellitus with diabetic neuropathy, unspecified: Secondary | ICD-10-CM | POA: Diagnosis not present

## 2023-06-19 DIAGNOSIS — Z7984 Long term (current) use of oral hypoglycemic drugs: Secondary | ICD-10-CM | POA: Diagnosis not present

## 2023-06-26 ENCOUNTER — Ambulatory Visit: Payer: PPO | Admitting: Internal Medicine

## 2023-06-26 NOTE — Progress Notes (Signed)
ARPIL, [place] ([id number]) [id number] Page 1 of 15 Visit Report for 06/19/2023 Arrival Information Details Patient Name: Date of Service: [facility], [name] 06/19/2023 3:30 PM Medical Record Number: [id number] Patient Account Number: [id number] Date of Birth/Sex: Treating RN: Oct 10, 1941 (82 y.o. [name] Primary Care : Barbette Reichmann Other Clinician: Betha Loa Referring : Treating /Extender: Adolph Pollack Weeks in Treatment: 21 Visit Information History Since Last Visit All ordered tests and consults were completed: No Patient Arrived: Wheel Chair Added or deleted any medications: No Arrival Time: 15:49 Any new allergies or adverse reactions: No Transfer Assistance: EasyPivot Patient Lift Had a fall or experienced change in No Patient Identification Verified: Yes activities of daily living that may affect Secondary Verification Process Completed: Yes risk of falls: Patient Requires Transmission-Based Precautions: No Signs or symptoms of abuse/neglect since last visito No Patient Has Alerts: Yes Hospitalized since last visit: No Patient Alerts: DM II Implantable device outside of the clinic excluding No ABI R 1.30 TBI 1.12 cellular tissue based products placed in the center ABI L 1.27 TBI 1.21 since last visit: Has Dressing in Place as Prescribed: Yes Pain Present Now: Yes Electronic Signature(s) Signed: 06/19/2023 5:47:33 PM By: Betha Loa Entered By: Betha Loa on 06/19/2023 15:49:51 -------------------------------------------------------------------------------- Clinic Level of Care Assessment Details Patient Name: Date of Service: [facility], [name] 06/19/2023 3:30 PM Medical Record Number: [id number] Patient Account Number: [id number] Date of Birth/Sex: Treating RN: 1941/01/15 (82 y.o. [name] Primary Care : Barbette Reichmann Other Clinician: Betha Loa Referring : Treating /Extender: Adolph Pollack Weeks in Treatment: 63 Clinic Level of Care Assessment Items TOOL 1 Quantity Score []  - 0 Use when EandM and Procedure is performed on INITIAL visit ASSESSMENTS - Nursing Assessment / Reassessment []  - 0 General Physical Exam (combine w/ comprehensive assessment (listed just below) when performed on new pt. evals) []  - 0 Comprehensive Assessment (HX, ROS, Risk Assessments, Wounds Hx, etc.) Sahlin, Casidee L ([id number]) [phone].pdf Page 2 of 15 ASSESSMENTS - Wound and Skin Assessment / Reassessment []  - 0 Dermatologic / Skin Assessment (not related to wound area) ASSESSMENTS - Ostomy and/or Continence Assessment and Care []  - 0 Incontinence Assessment and Management []  - 0 Ostomy Care Assessment and Management (repouching, etc.) PROCESS - Coordination of Care []  - 0 Simple Patient / Family Education for ongoing care []  - 0 Complex (extensive) Patient / Family Education for ongoing care []  - 0 Staff obtains Chiropractor, Records, T Results / Process Orders est []  - 0 Staff telephones HHA, Nursing Homes / Clarify orders / etc []  - 0 Routine Transfer to another Facility (non-emergent condition) []  - 0 Routine Hospital Admission (non-emergent condition) []  - 0 New Admissions / Manufacturing engineer / Ordering NPWT Apligraf, etc. , []  - 0 Emergency Hospital Admission (emergent condition) PROCESS - Special Needs []  - 0 Pediatric / Minor Patient Management []  - 0 Isolation Patient Management []  - 0 Hearing / Language / Visual special needs []  - 0 Assessment of Community assistance (transportation, D/C planning, etc.) []  - 0 Additional assistance / Altered mentation []  - 0 Support Surface(s) Assessment (bed, cushion, seat, etc.) INTERVENTIONS - Miscellaneous []  - 0 External ear exam []  - 0 Patient Transfer (multiple staff / Nurse, adult / Similar  devices) []  - 0 Simple Staple / Suture removal (25 or less) []  - 0 Complex Staple / Suture removal (26 or more) []  - 0 Hypo/Hyperglycemic Management (do not check if billed separately) []  -  0 Ankle / Brachial Index (ABI) - do not check if billed separately Has the patient been seen at the hospital within the last three years: Yes Total Score: 0 Level Of Care: ____ Electronic Signature(s) Signed: 06/19/2023 5:47:33 PM By: Betha Loa Entered By: Betha Loa on 06/19/2023 16:39:21 -------------------------------------------------------------------------------- Encounter Discharge Information Details Patient Name: Date of Service: [facility], [name] 06/19/2023 3:30 PM Medical Record Number: [id number] Patient Account Number: [id number] Date of Birth/Sex: Treating RN: August 03, 1941 (82 y.o. [name] Primary Care : Barbette Reichmann Other Clinician: Betha Loa Referring : Treating /Extender: Adolph Pollack [place], [name] ([id number]) [id number] Page 3 of 15 Weeks in Treatment: 38 Encounter Discharge Information Items Post Procedure Vitals Discharge Condition: Stable Temperature (F): 97.9 Ambulatory Status: Wheelchair Pulse (bpm): 94 Discharge Destination: Home Respiratory Rate (breaths/min): 18 Transportation: Private Auto Blood Pressure (mmHg): 132/77 Accompanied By: son Schedule Follow-up Appointment: Yes Clinical Summary of Care: Electronic Signature(s) Signed: 06/19/2023 5:47:33 PM By: Betha Loa Entered By: Betha Loa on 06/19/2023 17:42:37 -------------------------------------------------------------------------------- Lower Extremity Assessment Details Patient Name: Date of Service: [facility], [name] 06/19/2023 3:30 PM Medical Record Number: [id number] Patient Account Number: [id number] Date of Birth/Sex: Treating RN: 04-19-1941 (82 y.o. [name] Primary Care :  Barbette Reichmann Other Clinician: Betha Loa Referring : Treating /Extender: Adolph Pollack Weeks in Treatment: 7 Electronic Signature(s) Signed: 06/19/2023 5:47:33 PM By: Betha Loa Signed: 06/26/2023 2:28:30 PM By: Elliot Gurney, BSN, RN, CWS, Kim RN, BSN Entered By: Betha Loa on 06/19/2023 16:24:45 -------------------------------------------------------------------------------- Multi Wound Chart Details Patient Name: Date of Service: [facility], [name] 06/19/2023 3:30 PM Medical Record Number: [id number] Patient Account Number: [id number] Date of Birth/Sex: Treating RN: 01/28/1941 (82 y.o. [name] Primary Care : Barbette Reichmann Other Clinician: Betha Loa Referring : Treating /Extender: Adolph Pollack Weeks in Treatment: 48 Vital Signs Height(in): Pulse(bpm): 94 Weight(lbs): 98 Blood Pressure(mmHg): 132/77 Body Mass Index(BMI): Temperature(F): 97.9 Respiratory Rate(breaths/min): 18 [Counts, Phoebie L ([id number]):Photos:] [[id number] Page 4 of 15:11 13 14] Right Trochanter Left Calcaneus Right, Distal, Medial Lower Leg Wound Location: Pressure Injury Pressure Injury Gradually Appeared Wounding Event: Pressure Ulcer Pressure Ulcer Diabetic Wound/Ulcer of the Lower Primary Etiology: Extremity Cataracts, Lymphedema, Cataracts, Lymphedema, Cataracts, Lymphedema, Comorbid History: Hypertension, Peripheral Arterial Hypertension, Peripheral Arterial Hypertension, Peripheral Arterial Disease, Peripheral Venous Disease, Disease, Peripheral Venous Disease, Disease, Peripheral Venous Disease, Type II Diabetes, Osteoarthritis, Type II Diabetes, Osteoarthritis, Type II Diabetes, Osteoarthritis, Neuropathy Neuropathy Neuropathy 10/24/2022 11/28/2022 03/10/2023 Date Acquired: 33 28 14 Weeks of Treatment: Open Open Open Wound Status: No No No Wound  Recurrence: No No Yes Clustered Wound: 3x4x1.5 1x2.4x0.1 10x4.7x0.2 Measurements L x W x D (cm) 9.425 1.885 36.914 A (cm) : rea 14.137 0.188 7.383 Volume (cm) : -354.70% 45.50% -571.40% % Reduction in A rea: -6729.50% 45.70% -1242.40% % Reduction in Volume: 12 Starting Position 1 (o'clock): 9 Ending Position 1 (o'clock): 4 Maximum Distance 1 (cm): Yes N/A N/A Undermining: Category/Stage IV Category/Stage III Grade 2 Classification: Medium Medium Large Exudate A mount: Serosanguineous Serosanguineous Serosanguineous Exudate Type: red, brown red, brown red, brown Exudate Color: Well defined, not attached Flat and Intact N/A Wound Margin: Large (67-100%) Medium (34-66%) Large (67-100%) Granulation A mount: Red Pink Red Granulation Quality: None Present (0%) Medium (34-66%) Small (1-33%) Necrotic A mount: N/A Eschar, Adherent Slough Adherent Slough Necrotic Tissue: Fascia: Yes Fat Layer (Subcutaneous Tissue): Yes Fat Layer (Subcutaneous Tissue): Yes Exposed Structures: Fat Layer (Subcutaneous Tissue): Yes Fascia: No Fascia:  No Tendon: Yes Tendon: No Tendon: No Muscle: Yes Muscle: No Muscle: No Joint: No Joint: No Joint: No Bone: No Bone: No Bone: No None None None Epithelialization: Wound Number: [id number]  Photos: Left, Anterior Lower Leg Left Trochanter Right Calcaneus Wound Location: Pressure Injury Pressure Injury Pressure Injury Wounding Event: Pressure Ulcer Pressure Ulcer Pressure Ulcer Primary Etiology: Cataracts, Lymphedema, Cataracts, Lymphedema, Cataracts, Lymphedema, Comorbid History: Hypertension, Peripheral Arterial Hypertension, Peripheral Arterial Hypertension, Peripheral Arterial Disease, Peripheral Venous Disease, Disease, Peripheral Venous Disease, Disease, Peripheral Venous Disease, Type II Diabetes, Osteoarthritis, Type II Diabetes, Osteoarthritis, Type II Diabetes, Osteoarthritis, Neuropathy Neuropathy Neuropathy 04/11/2023  05/22/2023 07/23/2022 Date Acquired: 9 4 38 Weeks of Treatment: Open Open Open Wound Status: No No No Wound Recurrence: No No No Clustered Wound: 5.4x3x0.1 1.3x2.5x0.5 3.5x2.9x0.2 Measurements L x W x D (cm) 12.723 2.553 7.972 A (cm) : rea 1.272 1.276 1.594 Volume (cm) : 43.30% 21.50% 15.40% % Reduction in A rea: 43.30% -292.60% 43.60% % Reduction in Volume: N/A N/A N/A Undermining: Category/Stage IV Unstageable/Unclassified Category/Stage III Classification: Medium Medium Medium Exudate A mount: Serosanguineous Serosanguineous Serosanguineous Exudate Type: red, brown red, brown red, brown Exudate Color: Vertz, Jasminne L ([id number]) [phone].pdf Page 5 of 15 N/A N/A Distinct, outline attached Wound Margin: Large (67-100%) N/A Medium (34-66%) Granulation Amount: Red N/A Red, Pink Granulation Quality: Small (1-33%) N/A Medium (34-66%) Necrotic Amount: Adherent Slough Eschar, Adherent Slough Eschar, Adherent Slough Necrotic Tissue: Fat Layer (Subcutaneous Tissue): Yes Fat Layer (Subcutaneous Tissue): Yes Fat Layer (Subcutaneous Tissue): Yes Exposed Structures: Tendon: Yes Fascia: No Fascia: No Fascia: No Tendon: No Tendon: No Muscle: No Muscle: No Muscle: No Joint: No Joint: No Joint: No Bone: No Bone: No Bone: No None N/A None Epithelialization: Treatment Notes Electronic Signature(s) Signed: 06/19/2023 5:47:33 PM By: Betha Loa Entered By: Betha Loa on 06/19/2023 16:24:51 -------------------------------------------------------------------------------- Multi-Disciplinary Care Plan Details Patient Name: Date of Service: [facility], [name] 06/19/2023 3:30 PM Medical Record Number: [id number] Patient Account Number: [id number] Date of Birth/Sex: Treating RN: [birth year] (83 y.o. [name] Primary Care : Barbette Reichmann Other Clinician: Betha Loa Referring : Treating /Extender:  Adolph Pollack Weeks in Treatment: 30 Active Inactive Electronic Signature(s) Signed: 06/19/2023 5:47:33 PM By: Betha Loa Signed: 06/26/2023 2:28:30 PM By: Elliot Gurney, BSN, RN, CWS, Kim RN, BSN Entered By: Betha Loa on 06/19/2023 17:38:48 -------------------------------------------------------------------------------- Pain Assessment Details Patient Name: Date of Service: [facility], [name] 06/19/2023 3:30 PM Medical Record Number: [id number] Patient Account Number: [id number] Date of Birth/Sex: Treating RN: 1941/09/21 (82 y.o. [name] Primary Care : Barbette Reichmann Other Clinician: Betha Loa Referring : Treating /Extender: Adolph Pollack Weeks in Treatment: [address] CAIA, GITCHELL L ([id number]) [id number] Page 6 of 15 Location of Pain Severity and Description of Pain Patient Has Paino Yes Site Locations Pain Location: Generalized Pain, Pain in Ulcers Duration of the Pain. Constant / Intermittento Constant Rate the pain. Current Pain Level: 7 Character of Pain Describe the Pain: Aching, Dull Pain Management and Medication Current Pain Management: Medication: Yes Cold Application: No Rest: No Massage: No Activity: No T.E.N.S.: No Heat Application: No Leg drop or elevation: No Is the Current Pain Management Adequate: Inadequate How does your wound impact your activities of daily livingo Sleep: No Bathing: No Appetite: No Relationship With Others: No Bladder Continence: No Emotions: No Bowel Continence: No Work: No Toileting: No Drive: No Dressing: No Hobbies: No Electronic Signature(s) Signed: 06/19/2023 5:47:33 PM By: Betha Loa Signed: 06/26/2023 2:28:30  PM By: Elliot Gurney, BSN, RN, CWS, Kim RN, BSN Entered By: Betha Loa on 06/19/2023  15:53:54 -------------------------------------------------------------------------------- Patient/Caregiver Education Details Patient Name: Date of Service: [facility], [name] 8/7/2024andnbsp3:30 PM Medical Record Number: [id number] Patient Account Number: [id number] Date of Birth/Gender: Treating RN: 1941-06-24 (82 y.o. [name] Primary Care Physician: Barbette Reichmann Other Clinician: Betha Loa Referring Physician: Treating Physician/Extender: Adolph Pollack Weeks in Treatment: 72 Education Assessment Education Provided To: Patient and Caregiver SHERRE, WOOLCOTT ([id number]) [id number] Page 7 of 15 Education Topics Provided Wound/Skin Impairment: Handouts: Other: continue wound care as directed Methods: Explain/Verbal Responses: State content correctly Electronic Signature(s) Signed: 06/19/2023 5:47:33 PM By: Betha Loa Entered By: Betha Loa on 06/19/2023 17:39:18 -------------------------------------------------------------------------------- Wound Assessment Details Patient Name: Date of Service: [facility], [name] 06/19/2023 3:30 PM Medical Record Number: [id number] Patient Account Number: [id number] Date of Birth/Sex: Treating RN: 06/29/1941 (82 y.o. Cathlean Cower, Kim Primary Care : Barbette Reichmann Other Clinician: Betha Loa Referring : Treating /Extender: Adolph Pollack Weeks in Treatment: 69 Wound Status Wound Number: 11 Primary Pressure Ulcer Etiology: Wound Location: Right Trochanter Wound Open Wounding Event: Pressure Injury Status: Date Acquired: 10/24/2022 Comorbid Cataracts, Lymphedema, Hypertension, Peripheral Arterial Disease, Weeks Of Treatment: 33 History: Peripheral Venous Disease, Type II Diabetes, Osteoarthritis, Clustered Wound: No Neuropathy Photos Wound Measurements Length: (cm) 3 Width: (cm) 4 Depth: (cm) 1.5 Area: (cm)  9.425 Volume: (cm) 14.137 % Reduction in Area: -354.7% % Reduction in Volume: -6729.5% Epithelialization: None Undermining: Yes Starting Position (o'clock): 12 Ending Position (o'clock): 9 Maximum Distance: (cm) 4 Wound Description Classification: Category/Stage IV Wound Margin: Well defined, not attached Exudate Amount: Medium Exudate Type: Serosanguineous Exudate Color: red, brown Marandola, Lowana L ([id number]) Wound Bed Granulation Amount: Large (67-100%) Granulation Quality: Red Necrotic Amount: None Present (0%) Foul Odor After Cleansing: No Slough/Fibrino Yes [phone].pdf Page 8 of 15 Exposed Structure Fascia Exposed: Yes Fat Layer (Subcutaneous Tissue) Exposed: Yes Tendon Exposed: Yes Muscle Exposed: Yes Necrosis of Muscle: No Joint Exposed: No Bone Exposed: No Treatment Notes Wound #11 (Trochanter) Wound Laterality: Right Cleanser Vashe 5.8 (oz) Discharge Instruction: damp to dry daily Peri-Wound Care Topical Primary Dressing Secondary Dressing (BORDER) Zetuvit Plus SILICONE BORDER Dressing 5x5 (in/in) Discharge Instruction: Please do not put silicone bordered dressings under wraps. Use non-bordered dressing only. Secured With Compression Wrap Compression Stockings Facilities manager) Signed: 06/19/2023 5:47:33 PM By: Betha Loa Signed: 06/26/2023 2:28:30 PM By: Elliot Gurney, BSN, RN, CWS, Kim RN, BSN Entered By: Betha Loa on 06/19/2023 16:21:07 -------------------------------------------------------------------------------- Wound Assessment Details Patient Name: Date of Service: [facility], [name] 06/19/2023 3:30 PM Medical Record Number: [id number] Patient Account Number: [id number] Date of Birth/Sex: Treating RN: May 24, 1941 (82 y.o. [name] Primary Care : Barbette Reichmann Other Clinician: Betha Loa Referring : Treating /Extender: Adolph Pollack Weeks in  Treatment: 28 Wound Status Wound Number: 13 Primary Pressure Ulcer Etiology: Wound Location: Left Calcaneus Wound Open Wounding Event: Pressure Injury Status: Date Acquired: 11/28/2022 Comorbid Cataracts, Lymphedema, Hypertension, Peripheral Arterial Disease, Weeks Of Treatment: 28 History: Peripheral Venous Disease, Type II Diabetes, Osteoarthritis, Clustered Wound: No Neuropathy Photos Steinmeyer, Elysia L ([id number]) [phone].pdf Page 9 of 15 Wound Measurements Length: (cm) 1 Width: (cm) 2.4 Depth: (cm) 0.1 Area: (cm) 1.885 Volume: (cm) 0.188 % Reduction in Area: 45.5% % Reduction in Volume: 45.7% Epithelialization: None Wound Description Classification: Category/Stage III Wound Margin: Flat and Intact Exudate Amount: Medium Exudate Type: Serosanguineous Exudate Color: red, brown Foul Odor After Cleansing:  No Slough/Fibrino Yes Wound Bed Granulation Amount: Medium (34-66%) Exposed Structure Granulation Quality: Pink Fascia Exposed: No Necrotic Amount: Medium (34-66%) Fat Layer (Subcutaneous Tissue) Exposed: Yes Necrotic Quality: Eschar, Adherent Slough Tendon Exposed: No Muscle Exposed: No Joint Exposed: No Bone Exposed: No Treatment Notes Wound #13 (Calcaneus) Wound Laterality: Left Cleanser Vashe 5.8 (oz) Discharge Instruction: damp to dry daily Peri-Wound Care Topical Primary Dressing Secondary Dressing ABD Pad 5x9 (in/in) Discharge Instruction: Cover with ABD pad Kerlix 4.5 x 4.1 (in/yd) Discharge Instruction: Apply Kerlix 4.5 x 4.1 (in/yd) as instructed Secured With ACE WRAP - 538M ACE Elastic Bandage With VELCRO Brand Closure, 4 (in) Compression Wrap Compression Stockings Add-Ons Electronic Signature(s) Signed: 06/19/2023 5:47:33 PM By: Betha Loa Signed: 06/26/2023 2:28:30 PM By: Elliot Gurney, BSN, RN, CWS, Kim RN, BSN Entered By: Betha Loa on 06/19/2023 16:21:51 Shadle, [name] ([id number])  [id number] Page 10 of 15 -------------------------------------------------------------------------------- Wound Assessment Details Patient Name: Date of Service: [facility], [name] 06/19/2023 3:30 PM Medical Record Number: [id number] Patient Account Number: [id number] Date of Birth/Sex: Treating RN: [birth year] (82 y.o. [name] Primary Care : Barbette Reichmann Other Clinician: Betha Loa Referring : Treating /Extender: Adolph Pollack Weeks in Treatment: 4 Wound Status Wound Number: 14 Primary Diabetic Wound/Ulcer of the Lower Extremity Etiology: Wound Location: Right, Distal, Medial Lower Leg Wound Open Wounding Event: Gradually Appeared Status: Date Acquired: 03/10/2023 Comorbid Cataracts, Lymphedema, Hypertension, Peripheral Arterial Disease, Weeks Of Treatment: 14 History: Peripheral Venous Disease, Type II Diabetes, Osteoarthritis, Clustered Wound: Yes Neuropathy Photos Wound Measurements Length: (cm) 10 Width: (cm) 4.7 Depth: (cm) 0.2 Area: (cm) 36. Volume: (cm) 7.3 % Reduction in Area: -571.4% % Reduction in Volume: -1242.4% Epithelialization: None 914 83 Wound Description Classification: Grade 2 Exudate Amount: Large Exudate Type: Serosanguineous Exudate Color: red, brown Foul Odor After Cleansing: No Slough/Fibrino Yes Wound Bed Granulation Amount: Large (67-100%) Exposed Structure Granulation Quality: Red Fascia Exposed: No Necrotic Amount: Small (1-33%) Fat Layer (Subcutaneous Tissue) Exposed: Yes Necrotic Quality: Adherent Slough Tendon Exposed: No Muscle Exposed: No Joint Exposed: No Bone Exposed: No Treatment Notes Wound #14 (Lower Leg) Wound Laterality: Right, Medial, Distal Cleanser Vashe 5.8 (oz) Discharge Instruction: damp to dry daily Laurent, Niley L ([id number]) [id number].pdf Page 11 of 15 Peri-Wound Care Topical Primary  Dressing Secondary Dressing ABD Pad 5x9 (in/in) Discharge Instruction: Cover with ABD pad Kerlix 4.5 x 4.1 (in/yd) Discharge Instruction: Apply Kerlix 4.5 x 4.1 (in/yd) as instructed Secured With ACE WRAP - 538M ACE Elastic Bandage With VELCRO Brand Closure, 4 (in) Compression Wrap Compression Stockings Add-Ons Electronic Signature(s) Signed: 06/19/2023 5:47:33 PM By: Betha Loa Signed: 06/26/2023 2:28:30 PM By: Elliot Gurney, BSN, RN, CWS, Kim RN, BSN Entered By: Betha Loa on 06/19/2023 16:22:46 -------------------------------------------------------------------------------- Wound Assessment Details Patient Name: Date of Service: [facility], [name] 06/19/2023 3:30 PM Medical Record Number: [id number] Patient Account Number: [id number] Date of Birth/Sex: Treating RN: Nov 26, 1940 (82 y.o. Cathlean Cower, Kim Primary Care : Barbette Reichmann Other Clinician: Betha Loa Referring : Treating /Extender: Adolph Pollack Weeks in Treatment: 28 Wound Status Wound Number: 15 Primary Pressure Ulcer Etiology: Wound Location: Left, Anterior Lower Leg Wound Open Wounding Event: Pressure Injury Status: Date Acquired: 04/11/2023 Comorbid Cataracts, Lymphedema, Hypertension, Peripheral Arterial Disease, Weeks Of Treatment: 9 History: Peripheral Venous Disease, Type II Diabetes, Osteoarthritis, Clustered Wound: No Neuropathy Photos Wound Measurements Length: (cm) 5.4 Width: (cm) 3 Depth: (cm) 0.1 Area: (cm) 12.723 Vantassell, Shyleigh L ([id number]) Volume: (cm) 1.272 % Reduction in Area: 43.3% %  Reduction in Volume: 43.3% Epithelialization: None [id number] Page 12 of 15 Wound Description Classification: Category/Stage IV Exudate Amount: Medium Exudate Type: Serosanguineous Exudate Color: red, brown Foul Odor After Cleansing: No Slough/Fibrino Yes Wound Bed Granulation Amount: Large (67-100%) Exposed  Structure Granulation Quality: Red Fascia Exposed: No Necrotic Amount: Small (1-33%) Fat Layer (Subcutaneous Tissue) Exposed: Yes Necrotic Quality: Adherent Slough Tendon Exposed: Yes Muscle Exposed: No Joint Exposed: No Bone Exposed: No Treatment Notes Wound #15 (Lower Leg) Wound Laterality: Left, Anterior Cleanser Vashe 5.8 (oz) Discharge Instruction: damp to dry daily Peri-Wound Care Topical Primary Dressing Secondary Dressing ABD Pad 5x9 (in/in) Discharge Instruction: Cover with ABD pad Kerlix 4.5 x 4.1 (in/yd) Discharge Instruction: Apply Kerlix 4.5 x 4.1 (in/yd) as instructed Secured With ACE WRAP - 77M ACE Elastic Bandage With VELCRO Brand Closure, 4 (in) Compression Wrap Compression Stockings Add-Ons Electronic Signature(s) Signed: 06/19/2023 5:47:33 PM By: Betha Loa Signed: 06/26/2023 2:28:30 PM By: Elliot Gurney, BSN, RN, CWS, Kim RN, BSN Entered By: Betha Loa on 06/19/2023 16:23:17 -------------------------------------------------------------------------------- Wound Assessment Details Patient Name: Date of Service: [facility], [name] 06/19/2023 3:30 PM Medical Record Number: [id number] Patient Account Number: [id number] Date of Birth/Sex: Treating RN: 06-24-1941 (82 y.o. [name] Primary Care : Barbette Reichmann Other Clinician: Betha Loa Referring : Treating /Extender: Marcha Dutton, Vishwanath Weeks in Treatment: 52 Wound Status Wound Number: 16 Primary Pressure Ulcer Etiology: ALYNN, HIPKE ([id number]) [id number] Page 13 of 15 Etiology: Wound Location: Left Trochanter Wound Open Wounding Event: Pressure Injury Status: Date Acquired: 05/22/2023 Comorbid Cataracts, Lymphedema, Hypertension, Peripheral Arterial Disease, Weeks Of Treatment: 4 History: Peripheral Venous Disease, Type II Diabetes, Osteoarthritis, Clustered Wound: No Neuropathy Photos Wound  Measurements Length: (cm) 1.3 Width: (cm) 2.5 Depth: (cm) 0.5 Area: (cm) 2.553 Volume: (cm) 1.276 % Reduction in Area: 21.5% % Reduction in Volume: -292.6% Wound Description Classification: Unstageable/Unclassified Exudate Amount: Medium Exudate Type: Serosanguineous Exudate Color: red, brown Foul Odor After Cleansing: No Slough/Fibrino Yes Wound Bed Necrotic Amount: Large (67-100%) Exposed Structure Necrotic Quality: Eschar, Adherent Slough Fascia Exposed: No Fat Layer (Subcutaneous Tissue) Exposed: Yes Tendon Exposed: No Muscle Exposed: No Joint Exposed: No Bone Exposed: No Treatment Notes Wound #16 (Trochanter) Wound Laterality: Left Cleanser Normal Saline Discharge Instruction: Wash your hands with soap and water. Remove old dressing, discard into plastic bag and place into trash. Cleanse the wound with Normal Saline prior to applying a clean dressing using gauze sponges, not tissues or cotton balls. Do not scrub or use excessive force. Pat dry using gauze sponges, not tissue or cotton balls. Peri-Wound Care Topical Santyl Collagenase Ointment, 30 (gm), tube Discharge Instruction: apply nickel thick to wound bed only Primary Dressing Gauze Discharge Instruction: Vashe damp to dry over Santyl Secondary Dressing (BORDER) Zetuvit Plus SILICONE BORDER Dressing 4x4 (in/in) Discharge Instruction: Please do not put silicone bordered dressings under wraps. Use non-bordered dressing only. Secured With Compression Wrap Compression Stockings Add-Ons Electronic Signature(s) BREYANNA, CALDON ([id number]) [id number] Page 14 of 15 Signed: 06/19/2023 5:47:33 PM By: Betha Loa Signed: 06/26/2023 2:28:30 PM By: Elliot Gurney, BSN, RN, CWS, Kim RN, BSN Entered By: Betha Loa on 06/19/2023 16:23:49 -------------------------------------------------------------------------------- Wound Assessment Details Patient Name: Date of Service: [facility], DIA NNE  L. 06/19/2023 3:30 PM Medical Record Number: [id number] Patient Account Number: [id number] Date of Birth/Sex: Treating RN: Aug 17, 1941 (82 y.o. [name] Primary Care : Barbette Reichmann Other Clinician: Betha Loa Referring : Treating /Extender: Adolph Pollack Weeks in Treatment: 31 Wound Status Wound Number: 6  Primary Pressure Ulcer Etiology: Wound Location: Right Calcaneus Wound Open Wounding Event: Pressure Injury Status: Date Acquired: 07/23/2022 Comorbid Cataracts, Lymphedema, Hypertension, Peripheral Arterial Disease, Weeks Of Treatment: 38 History: Peripheral Venous Disease, Type II Diabetes, Osteoarthritis, Clustered Wound: No Neuropathy Photos Wound Measurements Length: (cm) 3.5 Width: (cm) 2.9 Depth: (cm) 0.2 Area: (cm) 7.972 Volume: (cm) 1.594 % Reduction in Area: 15.4% % Reduction in Volume: 43.6% Epithelialization: None Wound Description Classification: Category/Stage III Wound Margin: Distinct, outline attached Exudate Amount: Medium Exudate Type: Serosanguineous Exudate Color: red, brown Foul Odor After Cleansing: No Slough/Fibrino Yes Wound Bed Granulation Amount: Medium (34-66%) Exposed Structure Granulation Quality: Red, Pink Fascia Exposed: No Necrotic Amount: Medium (34-66%) Fat Layer (Subcutaneous Tissue) Exposed: Yes Necrotic Quality: Eschar, Adherent Slough Tendon Exposed: No Muscle Exposed: No Joint Exposed: No Bone Exposed: No Treatment Notes Mckoy, Kamrynn L ([id number]) [id number] Page 15 of 15 Wound #6 (Calcaneus) Wound Laterality: Right Cleanser Vashe 5.8 (oz) Discharge Instruction: damp to dry daily over Santyl Peri-Wound Care Topical Primary Dressing Secondary Dressing ABD Pad 5x9 (in/in) Discharge Instruction: Cover with ABD pad Kerlix 4.5 x 4.1 (in/yd) Discharge Instruction: Apply Kerlix 4.5 x 4.1 (in/yd) as instructed Secured With ACE WRAP -  3M ACE Elastic Bandage With VELCRO Brand Closure, 4 (in) Compression Wrap Compression Stockings Add-Ons Electronic Signature(s) Signed: 06/19/2023 5:47:33 PM By: Betha Loa Signed: 06/26/2023 2:28:30 PM By: Elliot Gurney, BSN, RN, CWS, Kim RN, BSN Entered By: Betha Loa on 06/19/2023 16:24:35 -------------------------------------------------------------------------------- Vitals Details Patient Name: Date of Service: [facility], [name] 06/19/2023 3:30 PM Medical Record Number: [id number] Patient Account Number: [id number] Date of Birth/Sex: Treating RN: 1941/06/01 (82 y.o. Cathlean Cower, Kim Primary Care : Barbette Reichmann Other Clinician: Betha Loa Referring : Treating /Extender: Adolph Pollack Weeks in Treatment: 34 Vital Signs Time Taken: 15:50 Temperature (F): 97.9 Weight (lbs): 98 Pulse (bpm): 94 Respiratory Rate (breaths/min): 18 Blood Pressure (mmHg): 132/77 Reference Range: 80 - 120 mg / dl Electronic Signature(s) Signed: 06/19/2023 5:47:33 PM By: Betha Loa Entered By: Betha Loa on 06/19/2023 15:53:50

## 2023-06-26 NOTE — Progress Notes (Signed)
[name] ([id number]) [id number] Page 1 of 14 Visit Report for 06/19/2023 Chief Complaint Document Details Patient Name: Date of Service: [name], [place] [name]. 06/19/2023 3:30 PM Medical Record Number: [id number] Patient Account Number: [id number] Date of Birth/Sex: Treating RN: Feb 13, 1941 (82 y.o. [name] Primary Care Provider: Barbette [name] Other Clinician: Betha [name] Referring Provider: Treating Provider/Extender: [name] Weeks in Treatment: 36 Information Obtained from: Patient Chief Complaint Right heel wound, right hip wound, right ankle wound, left ankle wound, right lower extremity wound, left anterior lower extremity wound, left hip wound Electronic Signature(s) Signed: 06/19/2023 4:45:38 PM By: Geralyn Corwin DO Entered By: Geralyn Corwin on 06/19/2023 16:38:07 -------------------------------------------------------------------------------- Debridement Details Patient Name: Date of Service: [name], [place] [name]. 06/19/2023 3:30 PM Medical Record Number: [id number] Patient Account Number: [id number] Date of Birth/Sex: Treating RN: [birth year] (82 y.o. [name] Primary Care Provider: Barbette [name] Other Clinician: Betha [name] Referring Provider: Treating Provider/Extender: [name] Weeks in Treatment: 38 Debridement Performed for Assessment: Wound #13 Left Calcaneus Performed By: Physician Geralyn Corwin, MD Debridement Type: Debridement Level of Consciousness (Pre-procedure): Awake and Alert Pre-procedure Verification/Time Out Yes - 16:32 Taken: Start Time: 16:32 Percent of Wound Bed Debrided: 100% T Area Debrided (cm): otal 1.88 Tissue and other material debrided: Viable, Non-Viable, Slough, Subcutaneous, Slough Level: Skin/Subcutaneous Tissue Debridement Description: Excisional Instrument: Curette Bleeding: Minimum Hemostasis Achieved:  Pressure Response to Treatment: Procedure was tolerated well Level of Consciousness (Post- Awake and Alert procedure): [name] ([id number]) [id number] Page 2 of 14 Post Debridement Measurements of Total Wound Length: (cm) 1 Stage: Category/Stage III Width: (cm) 2.4 Depth: (cm) 0.1 Volume: (cm) 0.188 Character of Wound/Ulcer Post Debridement: Stable Post Procedure Diagnosis Same as Pre-procedure Electronic Signature(s) Signed: 06/19/2023 4:45:38 PM By: Geralyn Corwin DO Signed: 06/19/2023 5:47:33 PM By: [name] Signed: 06/26/2023 2:28:30 PM By: Elliot Gurney, BSN, RN, CWS, Kim RN, BSN Entered By: [name] on 06/19/2023 16:34:28 -------------------------------------------------------------------------------- Debridement Details Patient Name: Date of Service: [name], DIA [name]. 06/19/2023 3:30 PM Medical Record Number: [id number] Patient Account Number: [id number] Date of Birth/Sex: Treating RN: Apr 16, 1941 (82 y.o. [name] Primary Care Provider: Barbette [name] Other Clinician: Betha [name] Referring Provider: Treating Provider/Extender: [name] Weeks in Treatment: 38 Debridement Performed for Assessment: Wound #16 Left Trochanter Performed By: Physician Geralyn Corwin, MD Debridement Type: Debridement Level of Consciousness (Pre-procedure): Awake and Alert Pre-procedure Verification/Time Out Yes - 16:34 Taken: Start Time: 16:34 Percent of Wound Bed Debrided: 80% T Area Debrided (cm): otal 2.04 Tissue and other material debrided: Viable, Non-Viable, Eschar Level: Non-Viable Tissue Debridement Description: Selective/Open Wound Instrument: Blade, Curette, Forceps Bleeding: Minimum Hemostasis Achieved: Pressure Response to Treatment: Procedure was tolerated well Level of Consciousness (Post- Awake and Alert procedure): Post Debridement Measurements of Total Wound Length: (cm) 1.3 Stage:  Unstageable/Unclassified Width: (cm) 2.5 Depth: (cm) 0.5 Volume: (cm) 1.276 Character of Wound/Ulcer Post Debridement: Stable Post Procedure Diagnosis Same as Pre-procedure Electronic Signature(s) Signed: 06/19/2023 4:45:38 PM By: Geralyn Corwin DO Signed: 06/26/2023 2:28:30 PM By: Elliot Gurney, BSN, RN, CWS, Kim RN, BSN Entered By: Geralyn Corwin on 06/19/2023 16:43:59 [name], [name] ([id number]) [id number] Page 3 of 14 -------------------------------------------------------------------------------- HPI Details Patient Name: Date of Service: [name], DIA [name]. 06/19/2023 3:30 PM Medical Record Number: [id number] Patient Account Number: [id number] Date of Birth/Sex: Treating RN: Jan 30, 1941 (82 y.o. [name] Primary Care Provider: Barbette [name] Other Clinician: Betha [name] Referring Provider: Treating Provider/Extender: [name]  Weeks in Treatment: 38 History of Present Illness HPI Description: 82 year old patient who comes with a referral for bilateral lower extremity edema and a lower extremity ulceration and has been sent by her PCP [name]. I understand the patient was recently put on amoxicillin and doxycycline but could not tolerate the amoxicillin. doxycycline course was completed. a BNP and EKG was supposed to be normal and the patient did not have any dyspnea. the patient has been on a diuretic. The patient was also prescribed a pair of elastic compression stockings of the 20-30 mmHg pressure variety. x-ray of the right ankle was done on 09/20/2015 and showed posttraumatic and postsurgical changes of the right ankle with secondary degenerative changes of the tibiotalar joint and to a lesser degree the subtalar joint. No definite acute bony abnormalities are noted. Past medical history significant for diabetes mellitus, hypertension, hyperlipidemia, right breast cancer treated with a mastectomy in 2014. She  has never smoked. 10/24/2015 -- she had delayed her vascular test because of her husband surgery but she is now ready to get him taken care of. He is also unable to use compression stockings and hence we will need to order her Juzo wraps. 10/31/2015-- was seen by Dr. Wyn Quaker on 10/28/2015. She had a left lower extremity arterial duplex done at his office a couple of years ago and that was essentially normal. Today they performed a venous duplex which revealed no evidence of deep vein thrombosis, superficial thrombophlebitis, no venous reflex was seen on the right and a minimal amount of reflux was seen on the left great saphenous vein but no significant reflux was seen. Impression was that there was a component of lymphedema present from a previous surgery and he would recommend compression stockings and leg elevation. Readmission: 07-24-2022 upon evaluation today patient presents for initial inspection here in our clinic concerning issues that she has been having with her legs this is actually been going on for several years according to what her family member with her today tells me as well as what the patient reiterates as well. She is currently most recently been seeing Dr. Ether Griffins and subsequently he had her in [place] boots. However 2 weeks ago he referred her to [place] and then subsequently took her out of the Unna boot wraps at that point. At this time the left leg looks to be worse in the right leg currently. She is on Lasix and lisinopril with hydrochlorothiazide she has high blood pressure she also has issues currently with lower extremity swelling and edema which has been an ongoing issue for her as well. Patient does have a history of chronic venous hypertension, lymphedema, diabetes mellitus type 2, hypertension, peripheral vascular disease, and neuropathy. Currently she is on Lasix as well as lisinopril with hydrochlorothiazide. 08-02-2022 upon evaluation today patient presents for reevaluation the  good news is she is actually doing somewhat better in regard to the wound and the overall appearance and sinuses. The unfortunate thing is her infection really is not significantly improved we did have to switch out her antibiotic once we got that final result back and I switched her to Levaquin and away from the doxycycline. Unfortunately the doxycycline had been doing poorly for her. In fact she had had diarrhea from the time she started taking it on Friday and she is still having it when she shows up today for evaluation. Again I was not aware of this and obviously she does appear to be somewhat dehydrated as well based on  what I see. My concern which I discussed with the patient today is the possibility of a C. difficile infection. With that being said fact this started immediately upon taking the doxycycline makes me think that it was just the medicine and is not completely out of her system yet despite having taken the last dose Tuesday morning. Nonetheless with what we are seeing currently I want to be sure that reason I Minna contact her primary care provider and see if a would be willing to see her and test for C. difficile infection. 08-07-2022 upon evaluation today patient appears to be doing well currently in regard to her wounds which are actually measuring much better this is great news. Fortunately I do not see any signs of active infection locally or systemically at this time which is great as well. The good news is she was tested for a C. difficile infection and it was negative. I am very pleased and thankful for primary care provider for doing that so this means that she was just having a severe reaction to the doxycycline we have added that to her allergy list at this point. 08-14-2022 upon evaluation today patient appears to be doing well currently in regard to her dehydration she is actually significantly improved compared to last time I saw her last week. With that being said I do  not see any evidence of active infection systemically at this time which is great news. However locally she still does appear to have cellulitis in left lower extremity. I discussed with her today that I do believe she would benefit from going ahead and starting the Levaquin. Again we will concerned about the C. difficile infection of the diarrhea but that this turned out to be just an issue with a reaction to the doxycycline. My hope is that the Levaquin will not cause her any complication and will be able to treat the infection. I did review her arterial study as well which was dated on 07-31-2022. It showed that she had a ABI on the right of 1.30 and on the left of 1.27 with a TBI on the right of 1.12 and the left 1.21 this is a normal arterial study. Again on the patient's wound culture she actually did show evidence of Proteus, Morganella, and MRSA. Levaquin is a good option here across the board. 09/26/2022 Ms. [name] Chizmar is a 82 year old female with a past medical history of type 2 diabetes currently controlled on oral agents, venous insufficiency/lymphedema, right breast cancer and chronic diastolic heart failure that presents to the clinic for a 1 month history of nonhealing wounds to the left lower extremity, right heel and left buttocks. She states that the buttocks wound and the right heel wound developed while she was in the [name]. She was admitted on 08/22/2022 for severe sepsis secondary to acute sigmoid and distal colonic diverticulitis. At that time it was noted she had a stage I decubitus ulcer to her bilateral buttocks. A wound to the heel was not mentioned. She currently denies systemic signs of infection. She came into clinic in a blue gown with no undergarments. She has not been dressing the wounds. She states she just recently obtained home health and they are coming out for the first time this week. She is seen in our clinic often for lower extremity wounds secondary to  venous insufficiency. 11/22; patient presents for follow-up. Son is present during the encounter. Per son it sounds like they are not doing any dressing changes. She  states she has home health but they have not come out. She is going to let us know which home health agency she has been approved for so we can send orders. Currently [name], [name] ([id number]) [id number] Page 4 of 14 she denies signs of infection. 12/13; patient presents for follow-up. Patient has home health and they are coming out once a week. It appears that there has not been any dressing changes except for with home health. Patient has a newly discovered wound to the left foot. There is exposed bone. There is slight erythema and increased warmth to the surrounding tissue. Patient is completely unaware of this wound. 12/20; patient presents for follow-up. She has been taking her oral antibiotics. She now has an eschar and wound to the right hip. She has been using Dakin's wet-to-dry dressings to the left foot wound and buttocks wound. She has been using Medihoney and silver alginate to the right heel wound. She currently denies systemic signs of infection. She is mainly bedbound or in a wheelchair. 1/10; patient presents for follow-up. Patient had her left second toenail removed by Dr. Ether Griffins, podiatry on 12/21. Unfortunately she did not feel well and she was admitted to the [name] for sepsis. Her right heel wound was thought to be infected and this was debrided in the OR by Dr. Ether Griffins. Culture and bone biopsy had no growth. She has been using Medihoney to all the wound beds except for the lefty buttocks wound for which she uses Dakin's wet-to-dry dressings. She has developed a pressure injury to the left heel now. This is despite having Prevalon boots. Although she is not wearing them today. She has completed 4 weeks of Augmentin and also a week of doxycycline for osteomyelitis of the left foot.  Her left foot wound no longer probes to bone. Currently she denies signs of infection. 1/24; patient presents for follow-up. She has been using Medihoney and Hydrofera Blue to the wound beds except for this buttocks wound she has been using Dakin's wet-to-dry dressings. She has developed 2 new wounds 1 to the left heel and the other to the right medial ankle. She claims she is wearing her Prevalon boots all the time although she does not have them on today. She currently denies signs of infection. 2/21; patient presents for follow-up. She was recently hospitalized on 12/19/2022 for sepsis secondary to Enterococcus bacillus bacteremia. She was treated with IV antibiotics and has completed her discharge oral antibiotics. She had a CT scanning of the abdomen/pelvis without acute abnormalities. Husband is present with patient today. They have been using Dakin's wet-to-dry dressings to the sacral wound and hip wound and Hydrofera Blue and Medihoney to the feet wounds. They are not changing the dressings daily. It is unclear how often they actually change the dressings. She has been wearing her Prevalon boots at night but not during the day. She currently denies systemic signs of infection. 3/20; patient presents for follow-up. She again was in the [name] for severe sepsis on 2/24; MRIs at that time showed findings suspicious for osteomyelitis to the right hip. She is currently on Augmentin to complete 4 weeks of this. She is following with ID. She has been using Dakin's wet-to-dry dressings to the sacrum and right hip however the solution is starting to cause discomfort. T the rest of the wound she has been using Hydrofera Blue and Medihoney. Patient o and son are not interested in doing palliative care or hospice. They state that they had discussions while  in the [name]. 4/17; patient presents for follow-up. She has been using Vashe wet-to-dry dressings to the sacrum and right trochanter wound. She has  been using Medihoney and Hydrofera Blue to the bilateral feet wounds. She has no issues or complaints today. She denies signs of infection. 5/1; patient developed a new wound to the right leg. Son was present and states that there was a scab and he removed it creating a wound. He has been keeping the area covered. T the sacral and right trochanter wound they have been using Vashe wet-to-dry dressings and Medihoney and Hydrofera Blue to the o bilateral feet wounds. She denies signs of infection. 6/1; 1 month follow-up. She has a new area on the left anterior lower leg. In the meantime the sacral ulcer is closed. She still has an extensive wound on the right hip with exposed tendon over the greater trochanter. On the lower extremities the left anterior lower leg wound is new right ankle left heel and right heel all look reasonably stable. We are using Vashe wet-to-dry on the hip Medihoney and Hydrofera Blue on the lower extremities. She is accompanied by her son I think who is doing the dressings. They do not have a pressure-relief surface at home but they do have a [name] bed 7/10; I have not seen this patient since 03/13/2023. She was scheduled to be seen on 5/29 however missed her appointment due to illness. She was seen on 6/5 by Dr. Leanord Hawking and she presents today for her follow-up. Unfortunately her wounds have declined since I last saw her. She has several new wounds including one to the left hip wound and one left anterior lower leg. The right hip wound has declined and has maggots with bone exposed. She has been using Vashe wet- to-dry dressings to Right hip wound and Medihoney and Hydrofera Blue to the The remaining wounds. Sacral wound remains closed. 7/24; patient presents for follow-up. At last clinic visit I recommended she go to the ED for potential admission, IV antibiotics and further imaging. She had MRI of the pelvis and left foot that showed osteomyelitis. Podiatry advised  conservative management. She was also seen by infectious disease who discharged patient on 2 weeks of linezolid and 2 weeks of ciprofloxacin. Patient is at home and has home health. Palliative care was consulted during the stay and she is full code and has declined further outpatient palliative care and hospice services.. She currently denies systemic signs of infection. Overall there is improvement in the appearance of the wounds. There is healthier granulation tissue present. However this is overall poor prognosis case. She has Prevalon boots but it is unclear if she is using these. She does not have them on today. She follows up with infectious disease next week. 8/7; Patient's been using Vashe wet-to-dry dressing to all the wound beds except for the left hip she has been using Santyl. She followed up with infectious disease on 8/1. Currently she is on linezolid and ciprofloxacin. They recommended lab work to see if she needs extension of her oral antibiotics. She currently denies systemic signs of infection. Electronic Signature(s) Signed: 06/19/2023 4:45:38 PM By: Geralyn Corwin DO Entered By: Geralyn Corwin on 06/19/2023 16:39:11 -------------------------------------------------------------------------------- Physical Exam Details Patient Name: Date of Service: [facility], D[place] [name]. 06/19/2023 3:30 PM Medical Record Number: [id number] Patient Account Number: [id number] Date of Birth/Sex: Treating RN: August 01, 1941 (82 y.o. [name] Primary Care Provider: Barbette [name] Other Clinician: Bleu, Francesconi ([id number]) [id number] Page  5 of 14 Referring Provider: Treating Provider/Extender: Marcha Dutton, Vishwanath Weeks in Treatment: 19 Constitutional . Cardiovascular . Psychiatric . Notes T the right There is an open wound With granulation tissue that probes to bone . T the left trochanter there A wound with eschar and  nonviable tissue. T the o o o lower extremities and feet bilaterally there are scattered open wounds with granulation tissue and nonviable tissue. No obvious soft tissue infection to the lower extremities. Electronic Signature(s) Signed: 06/19/2023 4:45:38 PM By: Geralyn Corwin DO Entered By: Geralyn Corwin on 06/19/2023 16:40:01 -------------------------------------------------------------------------------- Physician Orders Details Patient Name: Date of Service: [facility], DIA [name]. 06/19/2023 3:30 PM Medical Record Number: [id number] Patient Account Number: [id number] Date of Birth/Sex: Treating RN: October 04, 1941 (82 y.o. Cathlean Cower, Kim Primary Care Provider: Barbette [name] Other Clinician: Betha [name] Referring Provider: Treating Provider/Extender: [name] Weeks in Treatment: 76 Verbal / Phone Orders: Yes Clinician: Huel Coventry Read Back and Verified: Yes Diagnosis Coding ICD-10 Coding Code Description [phone] Non-pressure chronic ulcer of other part of left lower leg with fat layer exposed L97.812 Non-pressure chronic ulcer of other part of right lower leg with fat layer exposed I87.312 Chronic venous hypertension (idiopathic) with ulcer of left lower extremity I89.0 Lymphedema, not elsewhere classified E11.622 Type 2 diabetes mellitus with other skin ulcer I50.32 Chronic diastolic (congestive) heart failure L89.320 Pressure ulcer of left buttock, unstageable L89.610 Pressure ulcer of right heel, unstageable S91.302A Unspecified open wound, left foot, initial encounter M86.172 Other acute osteomyelitis, left ankle and foot L89.214 Pressure ulcer of right hip, stage 4 L89.513 Pressure ulcer of right ankle, stage 3 L89.620 Pressure ulcer of left heel, unstageable L89.220 Pressure ulcer of left hip, unstageable S81.802A Unspecified open wound, left lower leg, initial encounter Z79.84 Long term (current) use of oral hypoglycemic drugs Follow-up  Appointments Return Appointment in 2 weeks. Home Health JEHIELI, BERNABEI ([id number]) [id number] Page 6 of 14 CONTINUE Home Health for wound care. May utilize formulary equivalent dressing for wound treatment orders unless otherwise specified. Home Health Nurse may visit PRN to address patients wound care needs. - left trochanter, right heel- santyl daily all other wounds- vashe damp tp dry daily follow up on group 2 low air loss mattress Off-Loading Low air-loss mattress (Group 2) Wound Treatment Wound #11 - Trochanter Wound Laterality: Right Cleanser: Vashe 5.8 (oz) (Home Health) 1 x Per Day/30 Days Discharge Instructions: damp to dry daily Secondary Dressing: (BORDER) Zetuvit Plus SILICONE BORDER Dressing 5x5 (in/in) 1 x Per Day/30 Days Discharge Instructions: Please do not put silicone bordered dressings under wraps. Use non-bordered dressing only. Wound #13 - Calcaneus Wound Laterality: Left Cleanser: Vashe 5.8 (oz) (Home Health) 1 x Per Day/30 Days Discharge Instructions: damp to dry daily Secondary Dressing: ABD Pad 5x9 (in/in) 1 x Per Day/30 Days Discharge Instructions: Cover with ABD pad Secondary Dressing: Kerlix 4.5 x 4.1 (in/yd) 1 x Per Day/30 Days Discharge Instructions: Apply Kerlix 4.5 x 4.1 (in/yd) as instructed Secured With: ACE WRAP - 55M ACE Elastic Bandage With VELCRO Brand Closure, 4 (in) 1 x Per Day/30 Days Wound #14 - Lower Leg Wound Laterality: Right, Medial, Distal Cleanser: Vashe 5.8 (oz) (Home Health) 1 x Per Day/30 Days Discharge Instructions: damp to dry daily Secondary Dressing: ABD Pad 5x9 (in/in) 1 x Per Day/30 Days Discharge Instructions: Cover with ABD pad Secondary Dressing: Kerlix 4.5 x 4.1 (in/yd) 1 x Per Day/30 Days Discharge Instructions: Apply Kerlix 4.5 x 4.1 (in/yd) as instructed Secured With: ACE  WRAP - 38M ACE Elastic Bandage With VELCRO Brand Closure, 4 (in) 1 x Per Day/30 Days Wound #15 - Lower Leg Wound  Laterality: Left, Anterior Cleanser: Vashe 5.8 (oz) (Home Health) 1 x Per Day/30 Days Discharge Instructions: damp to dry daily Secondary Dressing: ABD Pad 5x9 (in/in) 1 x Per Day/30 Days Discharge Instructions: Cover with ABD pad Secondary Dressing: Kerlix 4.5 x 4.1 (in/yd) 1 x Per Day/30 Days Discharge Instructions: Apply Kerlix 4.5 x 4.1 (in/yd) as instructed Secured With: ACE WRAP - 38M ACE Elastic Bandage With VELCRO Brand Closure, 4 (in) 1 x Per Day/30 Days Wound #16 - Trochanter Wound Laterality: Left Cleanser: Normal Saline (Home Health) 1 x Per Day/30 Days Discharge Instructions: Wash your hands with soap and water. Remove old dressing, discard into plastic bag and place into trash. Cleanse the wound with Normal Saline prior to applying a clean dressing using gauze sponges, not tissues or cotton balls. Do not scrub or use excessive force. Pat dry using gauze sponges, not tissue or cotton balls. Topical: Santyl Collagenase Ointment, 30 (gm), tube (Home Health) 1 x Per Day/30 Days Discharge Instructions: apply nickel thick to wound bed only Prim Dressing: Gauze 1 x Per Day/30 Days ary Discharge Instructions: Vashe damp to dry over Santyl Secondary Dressing: (BORDER) Zetuvit Plus SILICONE BORDER Dressing 4x4 (in/in) (Home Health) 1 x Per Day/30 Days Discharge Instructions: Please do not put silicone bordered dressings under wraps. Use non-bordered dressing only. Wound #6 - Calcaneus Wound Laterality: Right Cleanser: Vashe 5.8 (oz) (Home Health) 1 x Per Day/30 Days Discharge Instructions: damp to dry daily over Santyl Secondary Dressing: ABD Pad 5x9 (in/in) 1 x Per Day/30 Days Discharge Instructions: Cover with ABD pad Secondary Dressing: Kerlix 4.5 x 4.1 (in/yd) 1 x Per Day/30 Days Discharge Instructions: Apply Kerlix 4.5 x 4.1 (in/yd) as instructed [name], [name] ([id number]) [id number] Page 7 of 14 Secured With: ACE WRAP - 38M ACE Elastic Bandage  With VELCRO Brand Closure, 4 (in) 1 x Per Day/30 Days Electronic Signature(s) Signed: 06/19/2023 5:47:33 PM By: [name] Signed: 06/21/2023 10:58:10 AM By: Geralyn Corwin DO Previous Signature: 06/19/2023 4:45:38 PM Version By: Geralyn Corwin DO Entered By: [name] on 06/19/2023 17:41:00 -------------------------------------------------------------------------------- Problem List Details Patient Name: Date of Service: [facility], DIA [name]. 06/19/2023 3:30 PM Medical Record Number: [id number] Patient Account Number: [id number] Date of Birth/Sex: Treating RN: 1941/05/06 (82 y.o. Cathlean Cower, Kim Primary Care Provider: Barbette [name] Other Clinician: Betha [name] Referring Provider: Treating Provider/Extender: [name] Weeks in Treatment: 56 Active Problems ICD-10 Encounter Code Description Active Date MDM Diagnosis [phone] Non-pressure chronic ulcer of other part of left lower leg with fat layer 09/26/2022 No Yes exposed L97.812 Non-pressure chronic ulcer of other part of right lower leg with fat layer 03/13/2023 No Yes exposed I87.312 Chronic venous hypertension (idiopathic) with ulcer of left lower extremity 09/26/2022 No Yes I89.0 Lymphedema, not elsewhere classified 09/26/2022 No Yes E11.622 Type 2 diabetes mellitus with other skin ulcer 09/26/2022 No Yes I50.32 Chronic diastolic (congestive) heart failure 09/26/2022 No Yes L89.320 Pressure ulcer of left buttock, unstageable 09/26/2022 No Yes L89.610 Pressure ulcer of right heel, unstageable 09/26/2022 No Yes S91.302A Unspecified open wound, left foot, initial encounter 10/24/2022 No Yes Doss, Marsella L ([id number]) [id number] Page 8 of 14 [phone] Other acute osteomyelitis, left ankle and foot 10/24/2022 No Yes L89.214 Pressure ulcer of right hip, stage 4 11/21/2022 No Yes L89.513 Pressure ulcer of right ankle, stage 3 12/05/2022 No Yes L89.620 Pressure ulcer  of  left heel, unstageable 12/05/2022 No Yes L89.220 Pressure ulcer of left hip, unstageable 05/22/2023 No Yes S81.802A Unspecified open wound, left lower leg, initial encounter 05/22/2023 No Yes Z79.84 Long term (current) use of oral hypoglycemic drugs 03/13/2023 No Yes Inactive Problems Resolved Problems Electronic Signature(s) Signed: 06/19/2023 4:45:38 PM By: Geralyn Corwin DO Entered By: Geralyn Corwin on 06/19/2023 16:38:02 -------------------------------------------------------------------------------- Progress Note Details Patient Name: Date of Service: [name], DIA [name]. 06/19/2023 3:30 PM Medical Record Number: [id number] Patient Account Number: [id number] Date of Birth/Sex: Treating RN: 08-Jan-1941 (82 y.o. [name] Primary Care Provider: Barbette [name] Other Clinician: Betha [name] Referring Provider: Treating Provider/Extender: [name] Weeks in Treatment: 66 Subjective Chief Complaint Information obtained from Patient Right heel wound, right hip wound, right ankle wound, left ankle wound, right lower extremity wound, left anterior lower extremity wound, left hip wound History of Present Illness (HPI) 82 year old patient who comes with a referral for bilateral lower extremity edema and a lower extremity ulceration and has been sent by her PCP [name]. I understand the patient was recently put on amoxicillin and doxycycline but could not tolerate the amoxicillin. doxycycline course was completed. a BNP and EKG was supposed to be normal and the patient did not have any dyspnea. the patient has been on a diuretic. The patient was also prescribed a pair of elastic compression stockings of the 20-30 mmHg pressure variety. x-ray of the right ankle was done on 09/20/2015 and showed posttraumatic and postsurgical changes of the right ankle with secondary degenerative changes of the tibiotalar joint and to a lesser degree the subtalar joint. No  definite acute bony abnormalities are noted. Past medical history significant for diabetes mellitus, hypertension, hyperlipidemia, right breast cancer treated with a mastectomy in 2014. She has never smoked. ANTONIETTA, BOHNENKAMP ([id number]) [id number] Page 9 of 14 10/24/2015 -- she had delayed her vascular test because of her husband surgery but she is now ready to get him taken care of. He is also unable to use compression stockings and hence we will need to order her Juzo wraps. 10/31/2015-- was seen by Dr. Wyn Quaker on 10/28/2015. She had a left lower extremity arterial duplex done at his office a couple of years ago and that was essentially normal. Today they performed a venous duplex which revealed no evidence of deep vein thrombosis, superficial thrombophlebitis, no venous reflex was seen on the right and a minimal amount of reflux was seen on the left great saphenous vein but no significant reflux was seen. Impression was that there was a component of lymphedema present from a previous surgery and he would recommend compression stockings and leg elevation. Readmission: 07-24-2022 upon evaluation today patient presents for initial inspection here in our clinic concerning issues that she has been having with her legs this is actually been going on for several years according to what her family member with her today tells me as well as what the patient reiterates as well. She is currently most recently been seeing Dr. Ether Griffins and subsequently he had her in [place] boots. However 2 weeks ago he referred her to [place] and then subsequently took her out of the [place] boot wraps at that point. At this time the left leg looks to be worse in the right leg currently. She is on Lasix and lisinopril with hydrochlorothiazide she has high blood pressure she also has issues currently with lower extremity swelling and edema which has been an ongoing issue for her as well.  Patient does have a  history of chronic venous hypertension, lymphedema, diabetes mellitus type 2, hypertension, peripheral vascular disease, and neuropathy. Currently she is on Lasix as well as lisinopril with hydrochlorothiazide. 08-02-2022 upon evaluation today patient presents for reevaluation the good news is she is actually doing somewhat better in regard to the wound and the overall appearance and sinuses. The unfortunate thing is her infection really is not significantly improved we did have to switch out her antibiotic once we got that final result back and I switched her to Levaquin and away from the doxycycline. Unfortunately the doxycycline had been doing poorly for her. In fact she had had diarrhea from the time she started taking it on Friday and she is still having it when she shows up today for evaluation. Again I was not aware of this and obviously she does appear to be somewhat dehydrated as well based on what I see. My concern which I discussed with the patient today is the possibility of a C. difficile infection. With that being said fact this started immediately upon taking the doxycycline makes me think that it was just the medicine and is not completely out of her system yet despite having taken the last dose Tuesday morning. Nonetheless with what we are seeing currently I want to be sure that reason I Minna contact her primary care provider and see if a would be willing to see her and test for C. difficile infection. 08-07-2022 upon evaluation today patient appears to be doing well currently in regard to her wounds which are actually measuring much better this is great news. Fortunately I do not see any signs of active infection locally or systemically at this time which is great as well. The good news is she was tested for a C. difficile infection and it was negative. I am very pleased and thankful for primary care provider for doing that so this means that she was just having a severe reaction to the  doxycycline we have added that to her allergy list at this point. 08-14-2022 upon evaluation today patient appears to be doing well currently in regard to her dehydration she is actually significantly improved compared to last time I saw her last week. With that being said I do not see any evidence of active infection systemically at this time which is great news. However locally she still does appear to have cellulitis in left lower extremity. I discussed with her today that I do believe she would benefit from going ahead and starting the Levaquin. Again we will concerned about the C. difficile infection of the diarrhea but that this turned out to be just an issue with a reaction to the doxycycline. My hope is that the Levaquin will not cause her any complication and will be able to treat the infection. I did review her arterial study as well which was dated on 07-31-2022. It showed that she had a ABI on the right of 1.30 and on the left of 1.27 with a TBI on the right of 1.12 and the left 1.21 this is a normal arterial study. Again on the patient's wound culture she actually did show evidence of Proteus, Morganella, and MRSA. Levaquin is a good option here across the board. 09/26/2022 [name] is a 82 year old female with a past medical history of type 2 diabetes currently controlled on oral agents, venous insufficiency/lymphedema, right breast cancer and chronic diastolic heart failure that presents to the clinic for a 1 month history of  nonhealing wounds to the left lower extremity, right heel and left buttocks. She states that the buttocks wound and the right heel wound developed while she was in the [name]. She was admitted on 08/22/2022 for severe sepsis secondary to acute sigmoid and distal colonic diverticulitis. At that time it was noted she had a stage I decubitus ulcer to her bilateral buttocks. A wound to the heel was not mentioned. She currently denies systemic signs of  infection. She came into clinic in a blue gown with no undergarments. She has not been dressing the wounds. She states she just recently obtained home health and they are coming out for the first time this week. She is seen in our clinic often for lower extremity wounds secondary to venous insufficiency. 11/22; patient presents for follow-up. Son is present during the encounter. Per son it sounds like they are not doing any dressing changes. She states she has home health but they have not come out. She is going to let us know which home health agency she has been approved for so we can send orders. Currently she denies signs of infection. 12/13; patient presents for follow-up. Patient has home health and they are coming out once a week. It appears that there has not been any dressing changes except for with home health. Patient has a newly discovered wound to the left foot. There is exposed bone. There is slight erythema and increased warmth to the surrounding tissue. Patient is completely unaware of this wound. 12/20; patient presents for follow-up. She has been taking her oral antibiotics. She now has an eschar and wound to the right hip. She has been using Dakin's wet-to-dry dressings to the left foot wound and buttocks wound. She has been using Medihoney and silver alginate to the right heel wound. She currently denies systemic signs of infection. She is mainly bedbound or in a wheelchair. 1/10; patient presents for follow-up. Patient had her left second toenail removed by Dr. Ether Griffins, podiatry on 12/21. Unfortunately she did not feel well and she was admitted to the [name] for sepsis. Her right heel wound was thought to be infected and this was debrided in the OR by Dr. Ether Griffins. Culture and bone biopsy had no growth. She has been using Medihoney to all the wound beds except for the lefty buttocks wound for which she uses Dakin's wet-to-dry dressings. She has developed a pressure injury to the  left heel now. This is despite having Prevalon boots. Although she is not wearing them today. She has completed 4 weeks of Augmentin and also a week of doxycycline for osteomyelitis of the left foot. Her left foot wound no longer probes to bone. Currently she denies signs of infection. 1/24; patient presents for follow-up. She has been using Medihoney and Hydrofera Blue to the wound beds except for this buttocks wound she has been using Dakin's wet-to-dry dressings. She has developed 2 new wounds 1 to the left heel and the other to the right medial ankle. She claims she is wearing her Prevalon boots all the time although she does not have them on today. She currently denies signs of infection. 2/21; patient presents for follow-up. She was recently hospitalized on 12/19/2022 for sepsis secondary to Enterococcus bacillus bacteremia. She was treated with IV antibiotics and has completed her discharge oral antibiotics. She had a CT scanning of the abdomen/pelvis without acute abnormalities. Husband is present with patient today. They have been using Dakin's wet-to-dry dressings to the sacral wound and hip wound and  Hydrofera Blue and Medihoney to the feet wounds. They are not changing the dressings daily. It is unclear how often they actually change the dressings. She has been wearing her Prevalon boots at night but not during the day. She currently denies systemic signs of infection. 3/20; patient presents for follow-up. She again was in the [name] for severe sepsis on 2/24; MRIs at that time showed findings suspicious for osteomyelitis to the right hip. She is currently on Augmentin to complete 4 weeks of this. She is following with ID. She has been using Dakin's wet-to-dry dressings to the sacrum and right hip however the solution is starting to cause discomfort. T the rest of the wound she has been using Hydrofera Blue and Medihoney. Patient o and son are not interested in doing palliative care or  hospice. They state that they had discussions while in the [name]. 4/17; patient presents for follow-up. She has been using Vashe wet-to-dry dressings to the sacrum and right trochanter wound. She has been using Medihoney and Hydrofera Blue to the bilateral feet wounds. She has no issues or complaints today. She denies signs of infection. 5/1; patient developed a new wound to the right leg. Son was present and states that there was a scab and he removed it creating a wound. He has been keeping the area covered. T the sacral and right trochanter wound they have been using Vashe wet-to-dry dressings and Medihoney and Hydrofera Blue to the o [facility] [facility] [name], Quinisha L ([id number]) [id number] Page 10 of 14 bilateral feet wounds. She denies signs of infection. 6/1; 1 month follow-up. She has a new area on the left anterior lower leg. In the meantime the sacral ulcer is closed. She still has an extensive wound on the right hip with exposed tendon over the greater trochanter. On the lower extremities the left anterior lower leg wound is new right ankle left heel and right heel all look reasonably stable. We are using Vashe wet-to-dry on the hip Medihoney and Hydrofera Blue on the lower extremities. She is accompanied by her son I think who is doing the dressings. They do not have a pressure-relief surface at home but they do have a [name] bed 7/10; I have not seen this patient since 03/13/2023. She was scheduled to be seen on 5/29 however missed her appointment due to illness. She was seen on 6/5 by Dr. Leanord Hawking and she presents today for her follow-up. Unfortunately her wounds have declined since I last saw her. She has several new wounds including one to the left hip wound and one left anterior lower leg. The right hip wound has declined and has maggots with bone exposed. She has been using Vashe wet- to-dry dressings to Right hip wound and Medihoney and Hydrofera Blue to the The  remaining wounds. Sacral wound remains closed. 7/24; patient presents for follow-up. At last clinic visit I recommended she go to the ED for potential admission, IV antibiotics and further imaging. She had MRI of the pelvis and left foot that showed osteomyelitis. Podiatry advised conservative management. She was also seen by infectious disease who discharged patient on 2 weeks of linezolid and 2 weeks of ciprofloxacin. Patient is at home and has home health. Palliative care was consulted during the stay and she is full code and has declined further outpatient palliative care and hospice services.. She currently denies systemic signs of infection. Overall there is improvement in the appearance of the wounds. There is healthier granulation tissue present. However this is overall poor prognosis  case. She has Prevalon boots but it is unclear if she is using these. She does not have them on today. She follows up with infectious disease next week. 8/7; Patient's been using Vashe wet-to-dry dressing to all the wound beds except for the left hip she has been using Santyl. She followed up with infectious disease on 8/1. Currently she is on linezolid and ciprofloxacin. They recommended lab work to see if she needs extension of her oral antibiotics. She currently denies systemic signs of infection. Objective Constitutional Vitals Time Taken: 3:50 PM, Weight: 98 lbs, Temperature: 97.9 F, Pulse: 94 bpm, Respiratory Rate: 18 breaths/min, Blood Pressure: 132/77 mmHg. General Notes: T the right There is an open wound With granulation tissue that probes to bone . T the left trochanter there A wound with eschar and nonviable o o tissue. T the lower extremities and feet bilaterally there are scattered open wounds with granulation tissue and nonviable tissue. No obvious soft tissue o infection to the lower extremities. Integumentary (Hair, Skin) Wound #11 status is Open. Original cause of wound was Pressure Injury.  The date acquired was: 10/24/2022. The wound has been in treatment 33 weeks. The wound is located on the Right Trochanter. The wound measures 3cm length x 4cm width x 1.5cm depth; 9.425cm^2 area and 14.137cm^3 volume. There is muscle, tendon, Fat Layer (Subcutaneous Tissue), and fascia exposed. There is undermining starting at 12:00 and ending at 9:00 with a maximum distance of 4cm. There is a medium amount of serosanguineous drainage noted. The wound margin is well defined and not attached to the wound base. There is large (67- 100%) red granulation within the wound bed. There is no necrotic tissue within the wound bed. Wound #13 status is Open. Original cause of wound was Pressure Injury. The date acquired was: 11/28/2022. The wound has been in treatment 28 weeks. The wound is located on the Left Calcaneus. The wound measures 1cm length x 2.4cm width x 0.1cm depth; 1.885cm^2 area and 0.188cm^3 volume. There is Fat Layer (Subcutaneous Tissue) exposed. There is a medium amount of serosanguineous drainage noted. The wound margin is flat and intact. There is medium (34-66%) pink granulation within the wound bed. There is a medium (34-66%) amount of necrotic tissue within the wound bed including Eschar and Adherent Slough. Wound #14 status is Open. Original cause of wound was Gradually Appeared. The date acquired was: 03/10/2023. The wound has been in treatment 14 weeks. The wound is located on the Right,Distal,Medial Lower Leg. The wound measures 10cm length x 4.7cm width x 0.2cm depth; 36.914cm^2 area and 7.383cm^3 volume. There is Fat Layer (Subcutaneous Tissue) exposed. There is a large amount of serosanguineous drainage noted. There is large (67-100%) red granulation within the wound bed. There is a small (1-33%) amount of necrotic tissue within the wound bed including Adherent Slough. Wound #15 status is Open. Original cause of wound was Pressure Injury. The date acquired was: 04/11/2023. The wound has  been in treatment 9 weeks. The wound is located on the Left,Anterior Lower Leg. The wound measures 5.4cm length x 3cm width x 0.1cm depth; 12.723cm^2 area and 1.272cm^3 volume. There is tendon and Fat Layer (Subcutaneous Tissue) exposed. There is a medium amount of serosanguineous drainage noted. There is large (67-100%) red granulation within the wound bed. There is a small (1-33%) amount of necrotic tissue within the wound bed including Adherent Slough. Wound #16 status is Open. Original cause of wound was Pressure Injury. The date acquired was: 05/22/2023. The wound has  been in treatment 4 weeks. The wound is located on the Left Trochanter. The wound measures 1.3cm length x 2.5cm width x 0.5cm depth; 2.553cm^2 area and 1.276cm^3 volume. There is Fat Layer (Subcutaneous Tissue) exposed. There is a medium amount of serosanguineous drainage noted. There is a large (67-100%) amount of necrotic tissue within the wound bed including Eschar and Adherent Slough. Wound #6 status is Open. Original cause of wound was Pressure Injury. The date acquired was: 07/23/2022. The wound has been in treatment 38 weeks. The wound is located on the Right Calcaneus. The wound measures 3.5cm length x 2.9cm width x 0.2cm depth; 7.972cm^2 area and 1.594cm^3 volume. There is Fat Layer (Subcutaneous Tissue) exposed. There is a medium amount of serosanguineous drainage noted. The wound margin is distinct with the outline attached to the wound base. There is medium (34-66%) red, pink granulation within the wound bed. There is a medium (34-66%) amount of necrotic tissue within the wound bed including Eschar and Adherent Slough. Assessment Active Problems ICD-10 Non-pressure chronic ulcer of other part of left lower leg with fat layer exposed Non-pressure chronic ulcer of other part of right lower leg with fat layer exposed Chronic venous hypertension (idiopathic) with ulcer of left lower extremity Lymphedema, not elsewhere  classified Type 2 diabetes mellitus with other skin ulcer [name], [name] ([id number]) [id number] Page 11 of 14 Chronic diastolic (congestive) heart failure Pressure ulcer of left buttock, unstageable Pressure ulcer of right heel, unstageable Unspecified open wound, left foot, initial encounter Other acute osteomyelitis, left ankle and foot Pressure ulcer of right hip, stage 4 Pressure ulcer of right ankle, stage 3 Pressure ulcer of left heel, unstageable Pressure ulcer of left hip, unstageable Unspecified open wound, left lower leg, initial encounter Long term (current) use of oral hypoglycemic drugs Patient's wounds overall have healthier granulation tissue present. However there is signs of pressure injury to the right heel. Again discussed the importance of aggressive offloading for wound healing. I debrided nonviable tissue and recommended continuing Vashe wet-to-dry dressings to all the wound beds except for the left hip and now right heel. Follow-up in 2 weeks. Continue oral antibiotics per ID. Procedures Wound #13 Pre-procedure diagnosis of Wound #13 is a Pressure Ulcer located on the Left Calcaneus . There was a Excisional Skin/Subcutaneous Tissue Debridement with a total area of 1.88 sq cm performed by Geralyn Corwin, MD. With the following instrument(s): Curette to remove Viable and Non-Viable tissue/material. Material removed includes Subcutaneous Tissue and Slough and. A time out was conducted at 16:32, prior to the start of the procedure. A Minimum amount of bleeding was controlled with Pressure. The procedure was tolerated well. Post Debridement Measurements: 1cm length x 2.4cm width x 0.1cm depth; 0.188cm^3 volume. Post debridement Stage noted as Category/Stage III. Character of Wound/Ulcer Post Debridement is stable. Post procedure Diagnosis Wound #13: Same as Pre-Procedure Wound #16 Pre-procedure diagnosis of Wound #16 is a Pressure Ulcer  located on the Left Trochanter . There was a Selective/Open Wound Non-Viable Tissue Debridement with a total area of 2.04 sq cm performed by Geralyn Corwin, MD. With the following instrument(s): Blade, Curette, and Forceps to remove Viable and Non- Viable tissue/material. Material removed includes Eschar. A time out was conducted at 16:34, prior to the start of the procedure. A Minimum amount of bleeding was controlled with Pressure. The procedure was tolerated well. Post Debridement Measurements: 1.3cm length x 2.5cm width x 0.5cm depth; 1.276cm^3 volume. Post debridement Stage noted as Unstageable/Unclassified. Character of Wound/Ulcer Post Debridement is  stable. Post procedure Diagnosis Wound #16: Same as Pre-Procedure Plan Follow-up Appointments: Return Appointment in 2 weeks. Home Health: [facility] for wound care. May utilize formulary equivalent dressing for wound treatment orders unless otherwise specified. Home Health Nurse may visit PRN to address patients wound care needs. - left trochanter, right heel- santyl daily all other wounds- vashe damp tp dry daily follow up on group 2 low air loss mattress Off-Loading: Low air-loss mattress (Group 2) WOUND #11: - Trochanter Wound Laterality: Right Cleanser: Vashe 5.8 (oz) (Home Health) 1 x Per Day/30 Days Discharge Instructions: damp to dry daily WOUND #13: - Calcaneus Wound Laterality: Left Cleanser: Vashe 5.8 (oz) (Home Health) 1 x Per Day/30 Days Discharge Instructions: damp to dry daily WOUND #14: - Lower Leg Wound Laterality: Right, Medial, Distal Cleanser: Vashe 5.8 (oz) (Home Health) 1 x Per Day/30 Days Discharge Instructions: damp to dry daily WOUND #15: - Lower Leg Wound Laterality: Left, Anterior Cleanser: Vashe 5.8 (oz) (Home Health) 1 x Per Day/30 Days Discharge Instructions: damp to dry daily WOUND #16: - Trochanter Wound Laterality: Left Cleanser: Normal Saline (Home Health) 1 x Per Day/30 Days Discharge  Instructions: Wash your hands with soap and water. Remove old dressing, discard into plastic bag and place into trash. Cleanse the wound with Normal Saline prior to applying a clean dressing using gauze sponges, not tissues or cotton balls. Do not scrub or use excessive force. Pat dry using gauze sponges, not tissue or cotton balls. Topical: Santyl Collagenase Ointment, 30 (gm), tube (Home Health) 1 x Per Day/30 Days Discharge Instructions: apply nickel thick to wound bed only Secondary Dressing: (BORDER) Zetuvit Plus SILICONE BORDER Dressing 4x4 (in/in) (Home Health) 1 x Per Day/30 Days Discharge Instructions: Please do not put silicone bordered dressings under wraps. Use non-bordered dressing only. WOUND #6: - Calcaneus Wound Laterality: Right Cleanser: Vashe 5.8 (oz) (Home Health) 1 x Per Day/30 Days Discharge Instructions: damp to dry daily 1. Vashe wet-to-dry dressings 2. Santyl 3. Aggressive offloading 4. Continue oral antibiotics per ID [facility] [name], [name] ([id number]) [id number] Page 12 of 14 5. In office sharp debridement 6. Follow-up in 2 weeks Electronic Signature(s) Signed: 06/19/2023 4:45:38 PM By: Geralyn Corwin DO Entered By: Geralyn Corwin on 06/19/2023 16:44:16 -------------------------------------------------------------------------------- ROS/PFSH Details Patient Name: Date of Service: [facility], DIA [name]. 06/19/2023 3:30 PM Medical Record Number: [id number] Patient Account Number: [id number] Date of Birth/Sex: Treating RN: May 06, 1941 (82 y.o. [name] Primary Care Provider: Barbette [name] Other Clinician: Betha [name] Referring Provider: Treating Provider/Extender: [name] Weeks in Treatment: 15 Information Obtained From Patient Eyes Medical History: Positive for: Cataracts - removed 2 years ago Negative for: Glaucoma; Optic Neuritis Ear/Nose/Mouth/Throat Medical History: Negative for:  Chronic sinus problems/congestion; Middle ear problems Hematologic/Lymphatic Medical History: Positive for: Lymphedema Negative for: Anemia; Hemophilia; Human Immunodeficiency Virus; Sickle Cell Disease Respiratory Medical History: Negative for: Aspiration; Asthma; Chronic Obstructive Pulmonary Disease (COPD); Pneumothorax; Sleep Apnea; Tuberculosis Cardiovascular Medical History: Positive for: Hypertension; Peripheral Arterial Disease; Peripheral Venous Disease Gastrointestinal Medical History: Negative for: Cirrhosis ; Colitis; Crohns; Hepatitis A; Hepatitis B; Hepatitis C Endocrine Medical History: Positive for: Type II Diabetes Time with diabetes: 8years Treated with: Oral agents Blood sugar tested every day: No Genitourinary Medical HistoryZAHRIAH, SAKAMOTO ([id number]) [id number] Page 13 of 14 Past Medical History Notes: Hx of Kidney stones, hysterectomy Musculoskeletal Medical History: Positive for: Osteoarthritis Past Medical History Notes: Rotator cuff disorder Neurologic Medical History: Positive for: Neuropathy Negative for: Dementia; Quadriplegia; Paraplegia; Seizure  Disorder Oncologic Medical History: Past Medical History Notes: Breast Ca Psychiatric Medical History: Negative for: Anorexia/bulimia; Confinement Anxiety HBO Extended History Items Eyes: Cataracts Immunizations Pneumococcal Vaccine: Received Pneumococcal Vaccination: Yes Received Pneumococcal Vaccination On or After 60th Birthday: Yes Implantable Devices None Family and Social History Cancer: No; Diabetes: No; Heart Disease: Yes - Mother,Father; Hereditary Spherocytosis: No; Hypertension: Yes - Mother,Father; Kidney Disease: No; Lung Disease: No; Seizures: No; Stroke: Yes - Mother,Father; Thyroid Problems: No; Tuberculosis: No; Never smoker; Marital Status - Married; Alcohol Use: Never; Drug Use: No History; Caffeine Use: Daily; Financial Concerns: No;  Food, Clothing or Shelter Needs: No; Support System Lacking: No; Transportation Concerns: No Electronic Signature(s) Signed: 06/19/2023 4:45:38 PM By: Geralyn Corwin DO Signed: 06/26/2023 2:28:30 PM By: Elliot Gurney, BSN, RN, CWS, Kim RN, BSN Entered By: Geralyn Corwin on 06/19/2023 16:44:58 -------------------------------------------------------------------------------- SuperBill Details Patient Name: Date of Service: [facility], DIA [name]. 06/19/2023 Medical Record Number: [id number] Patient Account Number: [id number] Date of Birth/Sex: Treating RN: 18-Jun-1941 (82 y.o. [name] Primary Care Provider: Barbette [name] Other Clinician: Betha [name] Referring Provider: Treating Provider/Extender: [name] Weeks in Treatment: [address], Alianna L ([id number]) [id number] Page 14 of 14 ICD-10 Codes Code Description [phone] Non-pressure chronic ulcer of other part of left lower leg with fat layer exposed L97.812 Non-pressure chronic ulcer of other part of right lower leg with fat layer exposed I87.312 Chronic venous hypertension (idiopathic) with ulcer of left lower extremity I89.0 Lymphedema, not elsewhere classified E11.622 Type 2 diabetes mellitus with other skin ulcer I50.32 Chronic diastolic (congestive) heart failure L89.320 Pressure ulcer of left buttock, unstageable L89.610 Pressure ulcer of right heel, unstageable S91.302A Unspecified open wound, left foot, initial encounter M86.172 Other acute osteomyelitis, left ankle and foot L89.214 Pressure ulcer of right hip, stage 4 L89.513 Pressure ulcer of right ankle, stage 3 L89.620 Pressure ulcer of left heel, unstageable L89.220 Pressure ulcer of left hip, unstageable S81.802A Unspecified open wound, left lower leg, initial encounter Z79.84 Long term (current) use of oral hypoglycemic drugs Facility Procedures : CPT4 Code: [id number] Description: 11042 -  DEB SUBQ TISSUE 20 SQ CM/< ICD-10 Diagnosis Description L89.620 Pressure ulcer of left heel, unstageable Modifier: Quantity: 1 : CPT4 Code: [id number] Description: 97597 - DEBRIDE WOUND 1ST 20 SQ CM OR < ICD-10 Diagnosis Description L89.220 Pressure ulcer of left hip, unstageable Modifier: Quantity: 1 Physician Procedures : CPT4 Code Description Modifier [id number] 99213 - WC PHYS LEVEL 3 - EST PT 25 ICD-10 Diagnosis Description L97.822 Non-pressure chronic ulcer of other part of left lower leg with fat layer exposed L97.812 Non-pressure chronic ulcer of other part of right  lower leg with fat layer exposed L89.214 Pressure ulcer of right hip, stage 4 L89.610 Pressure ulcer of right heel, unstageable Quantity: 1 : [id number] 11042 - WC PHYS SUBQ TISS 20 SQ CM ICD-10 Diagnosis Description L89.620 Pressure ulcer of left heel, unstageable Quantity: 1 : [id number] 97597 - WC PHYS DEBR WO ANESTH 20 SQ CM ICD-10 Diagnosis Description L89.220 Pressure ulcer of left hip, unstageable Quantity: 1 Electronic Signature(s) Signed: 06/19/2023 4:45:38 PM By: Geralyn Corwin DO Entered By: Geralyn Corwin on 06/19/2023 16:44:28

## 2023-07-03 ENCOUNTER — Ambulatory Visit: Payer: PPO | Admitting: Physician Assistant

## 2023-07-03 ENCOUNTER — Encounter (HOSPITAL_BASED_OUTPATIENT_CLINIC_OR_DEPARTMENT_OTHER): Payer: PPO | Admitting: Internal Medicine

## 2023-07-03 DIAGNOSIS — E11622 Type 2 diabetes mellitus with other skin ulcer: Secondary | ICD-10-CM

## 2023-07-03 DIAGNOSIS — R339 Retention of urine, unspecified: Secondary | ICD-10-CM | POA: Diagnosis not present

## 2023-07-03 DIAGNOSIS — L89220 Pressure ulcer of left hip, unstageable: Secondary | ICD-10-CM

## 2023-07-03 DIAGNOSIS — I87312 Chronic venous hypertension (idiopathic) with ulcer of left lower extremity: Secondary | ICD-10-CM

## 2023-07-03 DIAGNOSIS — L97822 Non-pressure chronic ulcer of other part of left lower leg with fat layer exposed: Secondary | ICD-10-CM

## 2023-07-03 DIAGNOSIS — L97812 Non-pressure chronic ulcer of other part of right lower leg with fat layer exposed: Secondary | ICD-10-CM | POA: Diagnosis not present

## 2023-07-03 DIAGNOSIS — Z466 Encounter for fitting and adjustment of urinary device: Secondary | ICD-10-CM

## 2023-07-03 NOTE — Progress Notes (Signed)
Cath Change/ Replacement  Patient is present today for a catheter change due to urinary retention.  8ml of water was removed from the balloon, a 16FR foley cath was removed without difficulty.  Patient was cleaned and prepped in a sterile fashion with betadine. A 16 FR foley cath was replaced into the bladder, no complications were noted. Urine return was noted 5ml and urine was yellow in color. The balloon was filled with 10ml of sterile water. A night bag was attached for drainage.  Patient tolerated well.    Performed by: Carman Ching, PA-C   Follow up: Return in about 4 weeks (around 07/31/2023) for Catheter exchange.

## 2023-07-04 DIAGNOSIS — L89154 Pressure ulcer of sacral region, stage 4: Secondary | ICD-10-CM | POA: Diagnosis not present

## 2023-07-04 DIAGNOSIS — L89892 Pressure ulcer of other site, stage 2: Secondary | ICD-10-CM | POA: Diagnosis not present

## 2023-07-04 DIAGNOSIS — L89890 Pressure ulcer of other site, unstageable: Secondary | ICD-10-CM | POA: Diagnosis not present

## 2023-07-04 DIAGNOSIS — L89610 Pressure ulcer of right heel, unstageable: Secondary | ICD-10-CM | POA: Diagnosis not present

## 2023-07-04 DIAGNOSIS — L89213 Pressure ulcer of right hip, stage 3: Secondary | ICD-10-CM | POA: Diagnosis not present

## 2023-07-04 DIAGNOSIS — L89613 Pressure ulcer of right heel, stage 3: Secondary | ICD-10-CM | POA: Diagnosis not present

## 2023-07-12 NOTE — Progress Notes (Signed)
[name], [name] ([id number]) [id number] Page 1 of 16 Visit Report for 07/03/2023 Chief Complaint Document Details Patient Name: Date of Service: [facility], [name] 07/03/2023 10:00 A M Medical Record Number: [id number] Patient Account Number: [id number] Date of Birth/Sex: Treating RN: 12-28-1940 (82 y.o. [name] Primary Care Provider: Barbette Reichmann Other Clinician: Betha Loa Referring Provider: Treating Provider/Extender: Adolph Pollack Weeks in Treatment: 40 Information Obtained from: Patient Chief Complaint Right heel wound, right hip wound, right ankle wound, left ankle wound, right lower extremity wound, left anterior lower extremity wound, left hip wound Electronic Signature(s) Signed: 07/03/2023 12:05:18 PM By: Geralyn Corwin DO Entered By: Geralyn Corwin on 07/03/2023 08:24:04 -------------------------------------------------------------------------------- Debridement Details Patient Name: Date of Service: [facility], [name] 07/03/2023 10:00 A M Medical Record Number: [id number] Patient Account Number: [id number] Date of Birth/Sex: Treating RN: 1941-06-23 (82 y.o. [name] Primary Care Provider: Barbette Reichmann Other Clinician: Betha Loa Referring Provider: Treating Provider/Extender: Adolph Pollack Weeks in Treatment: 40 Debridement Performed for Assessment: Wound #14 Right,Distal,Medial Lower Leg Performed By: Physician Geralyn Corwin, MD Debridement Type: Debridement Severity of Tissue Pre Debridement: Fat layer exposed Level of Consciousness (Pre-procedure): Awake and Alert Pre-procedure Verification/Time Out Yes - 10:52 Taken: Start Time: 10:52 Percent of Wound Bed Debrided: 100% T Area Debrided (cm): otal 32.5 Tissue and other material debrided: Viable, Non-Viable, Slough, Subcutaneous, Slough Level: Skin/Subcutaneous Tissue Debridement Description:  Excisional Instrument: Curette Bleeding: Minimum Hemostasis Achieved: Pressure Response to Treatment: Procedure was tolerated well Level of Consciousness (Post- Awake and Alert procedure): [name], [name] ([id number]) [id number] Page 2 of 16 Post Debridement Measurements of Total Wound Length: (cm) 9 Width: (cm) 4.6 Depth: (cm) 0.2 Volume: (cm) 6.503 Character of Wound/Ulcer Post Debridement: Stable Severity of Tissue Post Debridement: Fat layer exposed Post Procedure Diagnosis Same as Pre-procedure Electronic Signature(s) Signed: 07/03/2023 12:05:18 PM By: Geralyn Corwin DO Signed: 07/03/2023 5:02:30 PM By: Betha Loa Signed: 07/12/2023 11:59:03 AM By: Yevonne Pax RN Entered By: Betha Loa on 07/03/2023 07:53:23 -------------------------------------------------------------------------------- Debridement Details Patient Name: Date of Service: [facility], [name] 07/03/2023 10:00 A M Medical Record Number: [id number] Patient Account Number: [id number] Date of Birth/Sex: Treating RN: [birth year] (82 y.o. [name] Primary Care Provider: Barbette Reichmann Other Clinician: Betha Loa Referring Provider: Treating Provider/Extender: Adolph Pollack Weeks in Treatment: 40 Debridement Performed for Assessment: Wound #6 Right Calcaneus Performed By: Physician Geralyn Corwin, MD Debridement Type: Debridement Level of Consciousness (Pre-procedure): Awake and Alert Pre-procedure Verification/Time Out Yes - 10:55 Taken: Start Time: 10:55 Percent of Wound Bed Debrided: 100% T Area Debrided (cm): otal 6.32 Tissue and other material debrided: Viable, Non-Viable, Slough, Subcutaneous, Slough Level: Skin/Subcutaneous Tissue Debridement Description: Excisional Instrument: Curette Bleeding: Minimum Hemostasis Achieved: Pressure Response to Treatment: Procedure was tolerated well Level of Consciousness (Post- Awake  and Alert procedure): Post Debridement Measurements of Total Wound Length: (cm) 3.5 Stage: Category/Stage III Width: (cm) 2.3 Depth: (cm) 0.3 Volume: (cm) 1.897 Character of Wound/Ulcer Post Debridement: Stable Post Procedure Diagnosis Same as Pre-procedure Electronic Signature(s) Signed: 07/03/2023 12:05:18 PM By: Geralyn Corwin DO Signed: 07/03/2023 5:02:30 PM By: Betha Loa Signed: 07/12/2023 11:59:03 AM By: Yevonne Pax RN Entered By: Betha Loa on 07/03/2023 07:55:22 [name], [name] ([id number]) [id number] Page 3 of 16 -------------------------------------------------------------------------------- Debridement Details Patient Name: Date of Service: [facility], [name] 07/03/2023 10:00 A M Medical Record Number: [id number] Patient Account Number: [id number] Date of Birth/Sex: Treating RN: Sep 28, 1941 (82 y.o. F) Epps, Lyla Son  Primary Care Provider: Barbette Reichmann Other Clinician: Betha Loa Referring Provider: Treating Provider/Extender: Adolph Pollack Weeks in Treatment: 40 Debridement Performed for Assessment: Wound #13 Left Calcaneus Performed By: Physician Geralyn Corwin, MD Debridement Type: Debridement Level of Consciousness (Pre-procedure): Awake and Alert Pre-procedure Verification/Time Out Yes - 10:56 Taken: Start Time: 10:56 Percent of Wound Bed Debrided: 100% T Area Debrided (cm): otal 0.94 Tissue and other material debrided: Viable, Non-Viable, Slough, Subcutaneous, Slough Level: Skin/Subcutaneous Tissue Debridement Description: Excisional Instrument: Curette Bleeding: Minimum Hemostasis Achieved: Pressure Response to Treatment: Procedure was tolerated well Level of Consciousness (Post- Awake and Alert procedure): Post Debridement Measurements of Total Wound Length: (cm) 0.8 Stage: Category/Stage III Width: (cm) 1.5 Depth: (cm) 0.3 Volume: (cm) 0.283 Character of Wound/Ulcer Post  Debridement: Stable Post Procedure Diagnosis Same as Pre-procedure Electronic Signature(s) Signed: 07/03/2023 12:05:18 PM By: Geralyn Corwin DO Signed: 07/03/2023 5:02:30 PM By: Betha Loa Signed: 07/12/2023 11:59:03 AM By: Yevonne Pax RN Entered By: Betha Loa on 07/03/2023 07:56:15 -------------------------------------------------------------------------------- Debridement Details Patient Name: Date of Service: [facility], [name] 07/03/2023 10:00 A M Medical Record Number: [id number] Patient Account Number: [id number] [name], [name] ([id number]) [id number] Page 4 of 16 Date of Birth/Sex: Treating RN: May 04, 1941 (82 y.o. [name] Primary Care Provider: Barbette Reichmann Other Clinician: Betha Loa Referring Provider: Treating Provider/Extender: Adolph Pollack Weeks in Treatment: 40 Debridement Performed for Assessment: Wound #16 Left Trochanter Performed By: Physician Geralyn Corwin, MD Debridement Type: Debridement Level of Consciousness (Pre-procedure): Awake and Alert Pre-procedure Verification/Time Out Yes - 10:56 Taken: Start Time: 10:56 Percent of Wound Bed Debrided: 100% T Area Debrided (cm): otal 2.89 Tissue and other material debrided: Viable, Non-Viable, Slough, Slough Level: Non-Viable Tissue Debridement Description: Selective/Open Wound Instrument: Curette Bleeding: Minimum Hemostasis Achieved: Pressure Response to Treatment: Procedure was tolerated well Level of Consciousness (Post- Awake and Alert procedure): Post Debridement Measurements of Total Wound Length: (cm) 1.6 Stage: Unstageable/Unclassified Width: (cm) 2.3 Depth: (cm) 0.6 Volume: (cm) 1.734 Character of Wound/Ulcer Post Debridement: Stable Post Procedure Diagnosis Same as Pre-procedure Electronic Signature(s) Signed: 07/03/2023 12:05:18 PM By: Geralyn Corwin DO Signed: 07/03/2023 5:02:30 PM By: Betha Loa Signed: 07/12/2023 11:59:03 AM By: Yevonne Pax RN Entered By: Betha Loa on 07/03/2023 07:57:07 -------------------------------------------------------------------------------- HPI Details Patient Name: Date of Service: [facility], [name] 07/03/2023 10:00 A M Medical Record Number: [id number] Patient Account Number: [id number] Date of Birth/Sex: Treating RN: [birth year] (82 y.o. [name] Primary Care Provider: Barbette Reichmann Other Clinician: Betha Loa Referring Provider: Treating Provider/Extender: Adolph Pollack Weeks in Treatment: 40 History of Present Illness HPI Description: 83 year old patient who comes with a referral for bilateral lower extremity edema and a lower extremity ulceration and has been sent by her PCP [name]. I understand the patient was recently put on amoxicillin and doxycycline but could not tolerate the amoxicillin. doxycycline course was completed. a BNP and EKG was supposed to be normal and the patient did not have any dyspnea. the patient has been on a diuretic. The patient was also prescribed a pair of elastic compression stockings of the 20-30 mmHg pressure variety. x-ray of the right ankle was done on 09/20/2015 and showed posttraumatic and postsurgical changes of the right ankle with secondary degenerative changes of the tibiotalar joint and to a lesser degree the subtalar joint. No definite acute bony abnormalities are noted. Past medical history significant for diabetes mellitus, hypertension, hyperlipidemia, right breast cancer treated with a mastectomy in 2014. She has  never smoked. 10/24/2015 -- she had delayed her vascular test because of her husband surgery but she is now ready to get him taken care of. He is also unable to use compression stockings and hence we will need to order her Juzo wraps. SHELBIA, WAHLERT ([id number]) [id number] Page 5 of 16 10/31/2015-- was  seen by Dr. Wyn Quaker on 10/28/2015. She had a left lower extremity arterial duplex done at his office a couple of years ago and that was essentially normal. Today they performed a venous duplex which revealed no evidence of deep vein thrombosis, superficial thrombophlebitis, no venous reflex was seen on the right and a minimal amount of reflux was seen on the left great saphenous vein but no significant reflux was seen. Impression was that there was a component of lymphedema present from a previous surgery and he would recommend compression stockings and leg elevation. Readmission: 07-24-2022 upon evaluation today patient presents for initial inspection here in our clinic concerning issues that she has been having with her legs this is actually been going on for several years according to what her family member with her today tells me as well as what the patient reiterates as well. She is currently most recently been seeing Dr. Ether Griffins and subsequently he had her in Lilly boots. However 2 weeks ago he referred her to [place] and then subsequently took her out of the Unna boot wraps at that point. At this time the left leg looks to be worse in the right leg currently. She is on Lasix and lisinopril with hydrochlorothiazide she has high blood pressure she also has issues currently with lower extremity swelling and edema which has been an ongoing issue for her as well. Patient does have a history of chronic venous hypertension, lymphedema, diabetes mellitus type 2, hypertension, peripheral vascular disease, and neuropathy. Currently she is on Lasix as well as lisinopril with hydrochlorothiazide. 08-02-2022 upon evaluation today patient presents for reevaluation the good news is she is actually doing somewhat better in regard to the wound and the overall appearance and sinuses. The unfortunate thing is her infection really is not significantly improved we did have to switch out her antibiotic once we got that final  result back and I switched her to Levaquin and away from the doxycycline. Unfortunately the doxycycline had been doing poorly for her. In fact she had had diarrhea from the time she started taking it on Friday and she is still having it when she shows up today for evaluation. Again I was not aware of this and obviously she does appear to be somewhat dehydrated as well based on what I see. My concern which I discussed with the patient today is the possibility of a C. difficile infection. With that being said fact this started immediately upon taking the doxycycline makes me think that it was just the medicine and is not completely out of her system yet despite having taken the last dose Tuesday morning. Nonetheless with what we are seeing currently I want to be sure that reason I Minna contact her primary care provider and see if a would be willing to see her and test for C. difficile infection. 08-07-2022 upon evaluation today patient appears to be doing well currently in regard to her wounds which are actually measuring much better this is great news. Fortunately I do not see any signs of active infection locally or systemically at this time which is great as well. The good news is she was tested for a C.  difficile infection and it was negative. I am very pleased and thankful for primary care provider for doing that so this means that she was just having a severe reaction to the doxycycline we have added that to her allergy list at this point. 08-14-2022 upon evaluation today patient appears to be doing well currently in regard to her dehydration she is actually significantly improved compared to last time I saw her last week. With that being said I do not see any evidence of active infection systemically at this time which is great news. However locally she still does appear to have cellulitis in left lower extremity. I discussed with her today that I do believe she would benefit from going ahead and  starting the Levaquin. Again we will concerned about the C. difficile infection of the diarrhea but that this turned out to be just an issue with a reaction to the doxycycline. My hope is that the Levaquin will not cause her any complication and will be able to treat the infection. I did review her arterial study as well which was dated on 07-31-2022. It showed that she had a ABI on the right of 1.30 and on the left of 1.27 with a TBI on the right of 1.12 and the left 1.21 this is a normal arterial study. Again on the patient's wound culture she actually did show evidence of Proteus, Morganella, and MRSA. Levaquin is a good option here across the board. 09/26/2022 Ms. [name] Wehmeyer is a 82 year old female with a past medical history of type 2 diabetes currently controlled on oral agents, venous insufficiency/lymphedema, right breast cancer and chronic diastolic heart failure that presents to the clinic for a 1 month history of nonhealing wounds to the left lower extremity, right heel and left buttocks. She states that the buttocks wound and the right heel wound developed while she was in the hospital. She was admitted on 08/22/2022 for severe sepsis secondary to acute sigmoid and distal colonic diverticulitis. At that time it was noted she had a stage I decubitus ulcer to her bilateral buttocks. A wound to the heel was not mentioned. She currently denies systemic signs of infection. She came into clinic in a blue gown with no undergarments. She has not been dressing the wounds. She states she just recently obtained home health and they are coming out for the first time this week. She is seen in our clinic often for lower extremity wounds secondary to venous insufficiency. 11/22; patient presents for follow-up. Son is present during the encounter. Per son it sounds like they are not doing any dressing changes. She states she has home health but they have not come out. She is going to let us know which  home health agency she has been approved for so we can send orders. Currently she denies signs of infection. 12/13; patient presents for follow-up. Patient has home health and they are coming out once a week. It appears that there has not been any dressing changes except for with home health. Patient has a newly discovered wound to the left foot. There is exposed bone. There is slight erythema and increased warmth to the surrounding tissue. Patient is completely unaware of this wound. 12/20; patient presents for follow-up. She has been taking her oral antibiotics. She now has an eschar and wound to the right hip. She has been using Dakin's wet-to-dry dressings to the left foot wound and buttocks wound. She has been using Medihoney and silver alginate to the right heel  wound. She currently denies systemic signs of infection. She is mainly bedbound or in a wheelchair. 1/10; patient presents for follow-up. Patient had her left second toenail removed by Dr. Ether Griffins, podiatry on 12/21. Unfortunately she did not feel well and she was admitted to the hospital for sepsis. Her right heel wound was thought to be infected and this was debrided in the OR by Dr. Ether Griffins. Culture and bone biopsy had no growth. She has been using Medihoney to all the wound beds except for the lefty buttocks wound for which she uses Dakin's wet-to-dry dressings. She has developed a pressure injury to the left heel now. This is despite having Prevalon boots. Although she is not wearing them today. She has completed 4 weeks of Augmentin and also a week of doxycycline for osteomyelitis of the left foot. Her left foot wound no longer probes to bone. Currently she denies signs of infection. 1/24; patient presents for follow-up. She has been using Medihoney and Hydrofera Blue to the wound beds except for this buttocks wound she has been using Dakin's wet-to-dry dressings. She has developed 2 new wounds 1 to the left heel and the other to the  right medial ankle. She claims she is wearing her Prevalon boots all the time although she does not have them on today. She currently denies signs of infection. 2/21; patient presents for follow-up. She was recently hospitalized on 12/19/2022 for sepsis secondary to Enterococcus bacillus bacteremia. She was treated with IV antibiotics and has completed her discharge oral antibiotics. She had a CT scanning of the abdomen/pelvis without acute abnormalities. Husband is present with patient today. They have been using Dakin's wet-to-dry dressings to the sacral wound and hip wound and Hydrofera Blue and Medihoney to the feet wounds. They are not changing the dressings daily. It is unclear how often they actually change the dressings. She has been wearing her Prevalon boots at night but not during the day. She currently denies systemic signs of infection. 3/20; patient presents for follow-up. She again was in the hospital for severe sepsis on 2/24; MRIs at that time showed findings suspicious for osteomyelitis to the right hip. She is currently on Augmentin to complete 4 weeks of this. She is following with ID. She has been using Dakin's wet-to-dry dressings to the sacrum and right hip however the solution is starting to cause discomfort. T the rest of the wound she has been using Hydrofera Blue and Medihoney. Patient o and son are not interested in doing palliative care or hospice. They state that they had discussions while in the hospital. 4/17; patient presents for follow-up. She has been using Vashe wet-to-dry dressings to the sacrum and right trochanter wound. She has been using Medihoney and Hydrofera Blue to the bilateral feet wounds. She has no issues or complaints today. She denies signs of infection. 5/1; patient developed a new wound to the right leg. Son was present and states that there was a scab and he removed it creating a wound. He has been keeping the area covered. T the sacral and right  trochanter wound they have been using Vashe wet-to-dry dressings and Medihoney and Hydrofera Blue to the o bilateral feet wounds. She denies signs of infection. 6/1; 1 month follow-up. She has a new area on the left anterior lower leg. In the meantime the sacral ulcer is closed. She still has an extensive wound on the right hip with exposed tendon over the greater trochanter. On the lower extremities the left anterior lower leg  wound is new right ankle left heel and right heel all Kincaid, Scarlette [name] ([id number]) [id number] Page 6 of 16 look reasonably stable. We are using Vashe wet-to-dry on the hip Medihoney and Hydrofera Blue on the lower extremities. She is accompanied by her son I think who is doing the dressings. They do not have a pressure-relief surface at home but they do have a hospital bed 7/10; I have not seen this patient since 03/13/2023. She was scheduled to be seen on 5/29 however missed her appointment due to illness. She was seen on 6/5 by Dr. Leanord Hawking and she presents today for her follow-up. Unfortunately her wounds have declined since I last saw her. She has several new wounds including one to the left hip wound and one left anterior lower leg. The right hip wound has declined and has maggots with bone exposed. She has been using Vashe wet- to-dry dressings to Right hip wound and Medihoney and Hydrofera Blue to the The remaining wounds. Sacral wound remains closed. 7/24; patient presents for follow-up. At last clinic visit I recommended she go to the ED for potential admission, IV antibiotics and further imaging. She had MRI of the pelvis and left foot that showed osteomyelitis. Podiatry advised conservative management. She was also seen by infectious disease who discharged patient on 2 weeks of linezolid and 2 weeks of ciprofloxacin. Patient is at home and has home health. Palliative care was consulted during the stay and she is full code and has declined  further outpatient palliative care and hospice services.. She currently denies systemic signs of infection. Overall there is improvement in the appearance of the wounds. There is healthier granulation tissue present. However this is overall poor prognosis case. She has Prevalon boots but it is unclear if she is using these. She does not have them on today. She follows up with infectious disease next week. 8/7; Patient's been using Vashe wet-to-dry dressing to all the wound beds except for the left hip she has been using Santyl. She followed up with infectious disease on 8/1. Currently she is on linezolid and ciprofloxacin. They recommended lab work to see if she needs extension of her oral antibiotics. She currently denies systemic signs of infection. 8/21; patient presents for follow-up. She has been using Vashe wet-to-dry dressings to all wounds except the left hip she has been using Santyl. All wounds have improved in size and appearance. She has healthy granulation tissue present to all wounds except the left hip still has nonviable tissue throughout. She states she has completed her oral antibiotics per ID. She currently denies systemic signs of infection. Electronic Signature(s) Signed: 07/03/2023 12:05:18 PM By: Geralyn Corwin DO Entered By: Geralyn Corwin on 07/03/2023 08:25:18 -------------------------------------------------------------------------------- Physical Exam Details Patient Name: Date of Service: [facility], [name] 07/03/2023 10:00 A M Medical Record Number: [id number] Patient Account Number: [id number] Date of Birth/Sex: Treating RN: Feb 16, 1941 (82 y.o. [name] Primary Care Provider: Barbette Reichmann Other Clinician: Betha Loa Referring Provider: Treating Provider/Extender: Adolph Pollack Weeks in Treatment: 40 Constitutional . Cardiovascular . Psychiatric . Notes T the lower extremities and feet there are open wounds with  granulation tissue and nonviable tissue. Postdebridement there is mainly granulation tissue o present. T the right hip there is an open wound with granulation tissue. No obvious probing to bone today. T the left trochanter there is an open wound with o o nonviable tissue throughout. No signs of surrounding soft tissue infection to any of the wound beds.  Electronic Signature(s) Signed: 07/03/2023 12:05:18 PM By: Geralyn Corwin DO Entered By: Geralyn Corwin on 07/03/2023 08:26:15 [name], [name] ([id number]) [id number] Page 7 of 16 -------------------------------------------------------------------------------- Physician Orders Details Patient Name: Date of Service: [facility], [name] 07/03/2023 10:00 A M Medical Record Number: [id number] Patient Account Number: [id number] Date of Birth/Sex: Treating RN: 12-24-1940 (82 y.o. [name] Primary Care Provider: Barbette Reichmann Other Clinician: Betha Loa Referring Provider: Treating Provider/Extender: Adolph Pollack Weeks in Treatment: 70 Verbal / Phone Orders: Yes Clinician: Yevonne Pax Read Back and Verified: Yes Diagnosis Coding Follow-up Appointments Return Appointment in 2 weeks. Home Health [facility] for wound care. May utilize formulary equivalent dressing for wound treatment orders unless otherwise specified. Home Health Nurse may visit PRN to address patients wound care needs. - left trochanter, right heel- santyl daily all other wounds- vashe damp tp dry daily Please follow up on group 2 low air loss mattress Off-Loading Low air-loss mattress (Group 2) Wound Treatment Wound #11 - Trochanter Wound Laterality: Right Cleanser: Vashe 5.8 (oz) (Home Health) 1 x Per Day/30 Days Discharge Instructions: damp to dry daily Secondary Dressing: (BORDER) Zetuvit Plus SILICONE BORDER Dressing 5x5 (in/in) 1 x Per Day/30 Days Discharge Instructions: Please do  not put silicone bordered dressings under wraps. Use non-bordered dressing only. Wound #13 - Calcaneus Wound Laterality: Left Cleanser: Vashe 5.8 (oz) (Home Health) 1 x Per Day/30 Days Discharge Instructions: damp to dry daily Secondary Dressing: ABD Pad 5x9 (in/in) 1 x Per Day/30 Days Discharge Instructions: Cover with ABD pad Secondary Dressing: Kerlix 4.5 x 4.1 (in/yd) 1 x Per Day/30 Days Discharge Instructions: Apply Kerlix 4.5 x 4.1 (in/yd) as instructed Secured With: ACE WRAP - 45M ACE Elastic Bandage With VELCRO Brand Closure, 4 (in) 1 x Per Day/30 Days Wound #14 - Lower Leg Wound Laterality: Right, Medial, Distal Cleanser: Vashe 5.8 (oz) (Home Health) 1 x Per Day/30 Days Discharge Instructions: damp to dry daily Secondary Dressing: ABD Pad 5x9 (in/in) 1 x Per Day/30 Days Discharge Instructions: Cover with ABD pad Secondary Dressing: Kerlix 4.5 x 4.1 (in/yd) 1 x Per Day/30 Days Discharge Instructions: Apply Kerlix 4.5 x 4.1 (in/yd) as instructed Secured With: ACE WRAP - 45M ACE Elastic Bandage With VELCRO Brand Closure, 4 (in) 1 x Per Day/30 Days Wound #15 - Lower Leg Wound Laterality: Left, Anterior Cleanser: Vashe 5.8 (oz) (Home Health) 1 x Per Day/30 Days Discharge Instructions: damp to dry daily Secondary Dressing: ABD Pad 5x9 (in/in) 1 x Per Day/30 Days Discharge Instructions: Cover with ABD pad Secondary Dressing: Kerlix 4.5 x 4.1 (in/yd) 1 x Per Day/30 Days [name], [name] ([id number]) [id number] Page 8 of 16 Discharge Instructions: Apply Kerlix 4.5 x 4.1 (in/yd) as instructed Secured With: ACE WRAP - 45M ACE Elastic Bandage With VELCRO Brand Closure, 4 (in) 1 x Per Day/30 Days Wound #16 - Trochanter Wound Laterality: Left Cleanser: Normal Saline (Home Health) 1 x Per Day/30 Days Discharge Instructions: Wash your hands with soap and water. Remove old dressing, discard into plastic bag and place into trash. Cleanse the wound with Normal Saline  prior to applying a clean dressing using gauze sponges, not tissues or cotton balls. Do not scrub or use excessive force. Pat dry using gauze sponges, not tissue or cotton balls. Topical: Santyl Collagenase Ointment, 30 (gm), tube (Home Health) 1 x Per Day/30 Days Discharge Instructions: apply nickel thick to wound bed only Prim Dressing: Gauze 1 x Per Day/30 Days ary Discharge Instructions: Vashe damp  to dry over Santyl Secondary Dressing: (BORDER) Zetuvit Plus SILICONE BORDER Dressing 4x4 (in/in) (Home Health) 1 x Per Day/30 Days Discharge Instructions: Please do not put silicone bordered dressings under wraps. Use non-bordered dressing only. Wound #6 - Calcaneus Wound Laterality: Right Cleanser: Vashe 5.8 (oz) (Home Health) 1 x Per Day/30 Days Discharge Instructions: damp to dry daily over Santyl Secondary Dressing: ABD Pad 5x9 (in/in) 1 x Per Day/30 Days Discharge Instructions: Cover with ABD pad Secondary Dressing: Kerlix 4.5 x 4.1 (in/yd) 1 x Per Day/30 Days Discharge Instructions: Apply Kerlix 4.5 x 4.1 (in/yd) as instructed Secured With: ACE WRAP - 56M ACE Elastic Bandage With VELCRO Brand Closure, 4 (in) 1 x Per Day/30 Days Electronic Signature(s) Signed: 07/03/2023 12:05:18 PM By: Geralyn Corwin DO Entered By: Geralyn Corwin on 07/03/2023 08:37:33 -------------------------------------------------------------------------------- Problem List Details Patient Name: Date of Service: [facility] [place], [name] 07/03/2023 10:00 A M Medical Record Number: [id number] Patient Account Number: [id number] Date of Birth/Sex: Treating RN: 1941/07/29 (83 y.o. [name] Primary Care Provider: Barbette Reichmann Other Clinician: Betha Loa Referring Provider: Treating Provider/Extender: Adolph Pollack Weeks in Treatment: 67 Active Problems ICD-10 Encounter Code Description Active Date MDM Diagnosis [phone] Non-pressure chronic ulcer of other part of left lower  leg with fat layer 09/26/2022 No Yes exposed L97.812 Non-pressure chronic ulcer of other part of right lower leg with fat layer 03/13/2023 No Yes exposed [name], [name] ([id number]) [id number] Page 9 of 16 I87.312 Chronic venous hypertension (idiopathic) with ulcer of left lower extremity 09/26/2022 No Yes I89.0 Lymphedema, not elsewhere classified 09/26/2022 No Yes E11.622 Type 2 diabetes mellitus with other skin ulcer 09/26/2022 No Yes L89.610 Pressure ulcer of right heel, unstageable 09/26/2022 No Yes L89.513 Pressure ulcer of right ankle, stage 3 12/05/2022 No Yes L89.620 Pressure ulcer of left heel, unstageable 12/05/2022 No Yes M86.172 Other acute osteomyelitis, left ankle and foot 10/24/2022 No Yes L89.214 Pressure ulcer of right hip, stage 4 11/21/2022 No Yes L89.220 Pressure ulcer of left hip, unstageable 05/22/2023 No Yes I50.32 Chronic diastolic (congestive) heart failure 09/26/2022 No Yes Z79.84 Long term (current) use of oral hypoglycemic drugs 03/13/2023 No Yes Inactive Problems ICD-10 Code Description Active Date Inactive Date S81.802A Unspecified open wound, left lower leg, initial encounter 05/22/2023 05/22/2023 Resolved Problems ICD-10 Code Description Active Date Resolved Date L89.320 Pressure ulcer of left buttock, unstageable 09/26/2022 09/26/2022 S91.302A Unspecified open wound, left foot, initial encounter 10/24/2022 10/24/2022 Electronic Signature(s) Signed: 07/03/2023 12:05:18 PM By: Geralyn Corwin DO Entered By: Geralyn Corwin on 07/03/2023 08:36:58 [name], [name] ([id number]) [id number] Page 10 of 16 -------------------------------------------------------------------------------- Progress Note Details Patient Name: Date of Service: [facility], [name] 07/03/2023 10:00 A M Medical Record Number: [id number] Patient Account Number: [id number] Date of Birth/Sex: Treating RN: 21-May-1941 (82 y.o. [name] Primary Care Provider: Barbette Reichmann Other Clinician: Betha Loa Referring Provider: Treating Provider/Extender: Adolph Pollack Weeks in Treatment: 40 Subjective Chief Complaint Information obtained from Patient Right heel wound, right hip wound, right ankle wound, left ankle wound, right lower extremity wound, left anterior lower extremity wound, left hip wound History of Present Illness (HPI) 82 year old patient who comes with a referral for bilateral lower extremity edema and a lower extremity ulceration and has been sent by her PCP [name]. I understand the patient was recently put on amoxicillin and doxycycline but could not tolerate the amoxicillin. doxycycline course was completed. a BNP and EKG was supposed to be normal and the patient did  not have any dyspnea. the patient has been on a diuretic. The patient was also prescribed a pair of elastic compression stockings of the 20-30 mmHg pressure variety. x-ray of the right ankle was done on 09/20/2015 and showed posttraumatic and postsurgical changes of the right ankle with secondary degenerative changes of the tibiotalar joint and to a lesser degree the subtalar joint. No definite acute bony abnormalities are noted. Past medical history significant for diabetes mellitus, hypertension, hyperlipidemia, right breast cancer treated with a mastectomy in 2014. She has never smoked. 10/24/2015 -- she had delayed her vascular test because of her husband surgery but she is now ready to get him taken care of. He is also unable to use compression stockings and hence we will need to order her Juzo wraps. 10/31/2015-- was seen by Dr. Wyn Quaker on 10/28/2015. She had a left lower extremity arterial duplex done at his office a couple of years ago and that was essentially normal. Today they performed a venous duplex which revealed no evidence of deep vein thrombosis, superficial thrombophlebitis, no venous  reflex was seen on the right and a minimal amount of reflux was seen on the left great saphenous vein but no significant reflux was seen. Impression was that there was a component of lymphedema present from a previous surgery and he would recommend compression stockings and leg elevation. Readmission: 07-24-2022 upon evaluation today patient presents for initial inspection here in our clinic concerning issues that she has been having with her legs this is actually been going on for several years according to what her family member with her today tells me as well as what the patient reiterates as well. She is currently most recently been seeing Dr. Ether Griffins and subsequently he had her in [place] boots. However 2 weeks ago he referred her to [place] and then subsequently took her out of the [place] boot wraps at that point. At this time the left leg looks to be worse in the right leg currently. She is on Lasix and lisinopril with hydrochlorothiazide she has high blood pressure she also has issues currently with lower extremity swelling and edema which has been an ongoing issue for her as well. Patient does have a history of chronic venous hypertension, lymphedema, diabetes mellitus type 2, hypertension, peripheral vascular disease, and neuropathy. Currently she is on Lasix as well as lisinopril with hydrochlorothiazide. 08-02-2022 upon evaluation today patient presents for reevaluation the good news is she is actually doing somewhat better in regard to the wound and the overall appearance and sinuses. The unfortunate thing is her infection really is not significantly improved we did have to switch out her antibiotic once we got that final result back and I switched her to Levaquin and away from the doxycycline. Unfortunately the doxycycline had been doing poorly for her. In fact she had had diarrhea from the time she started taking it on Friday and she is still having it when she shows up today for evaluation. Again I  was not aware of this and obviously she does appear to be somewhat dehydrated as well based on what I see. My concern which I discussed with the patient today is the possibility of a C. difficile infection. With that being said fact this started immediately upon taking the doxycycline makes me think that it was just the medicine and is not completely out of her system yet despite having taken the last dose Tuesday morning. Nonetheless with what we are seeing currently I want to be sure that  reason I Minna contact her primary care provider and see if a would be willing to see her and test for C. difficile infection. 08-07-2022 upon evaluation today patient appears to be doing well currently in regard to her wounds which are actually measuring much better this is great news. Fortunately I do not see any signs of active infection locally or systemically at this time which is great as well. The good news is she was tested for a C. difficile infection and it was negative. I am very pleased and thankful for primary care provider for doing that so this means that she was just having a severe reaction to the doxycycline we have added that to her allergy list at this point. 08-14-2022 upon evaluation today patient appears to be doing well currently in regard to her dehydration she is actually significantly improved compared to last time I saw her last week. With that being said I do not see any evidence of active infection systemically at this time which is great news. However locally she still does appear to have cellulitis in left lower extremity. I discussed with her today that I do believe she would benefit from going ahead and starting the Levaquin. Again we will concerned about the C. difficile infection of the diarrhea but that this turned out to be just an issue with a reaction to the doxycycline. My hope is that the Levaquin will not cause her any complication and will be able to treat the infection. I  did review her arterial study as well which was dated on 07-31-2022. It showed that she had a ABI on the right of 1.30 and on the left of 1.27 with a TBI on the right of 1.12 and the left 1.21 this is a normal arterial study. Again on the patient's wound culture she actually did show evidence of Proteus, Morganella, and MRSA. Levaquin is a good option here across the board. 09/26/2022 [name] is a 82 year old female with a past medical history of type 2 diabetes currently controlled on oral agents, venous insufficiency/lymphedema, right breast cancer and chronic diastolic heart failure that presents to the clinic for a 1 month history of nonhealing wounds to the left lower extremity, right heel and left buttocks. She states that the buttocks wound and the right heel wound developed while she was in the hospital. She was admitted on 08/22/2022 for severe sepsis secondary to acute sigmoid and distal colonic diverticulitis. At that time it was noted she had a stage I decubitus ulcer to her bilateral buttocks. A wound to the heel was not mentioned. She currently denies systemic signs of infection. She came into clinic in a blue gown LANIE, KOCHMAN [name] ([id number]) [id number] Page 11 of 16 with no undergarments. She has not been dressing the wounds. She states she just recently obtained home health and they are coming out for the first time this week. She is seen in our clinic often for lower extremity wounds secondary to venous insufficiency. 11/22; patient presents for follow-up. Son is present during the encounter. Per son it sounds like they are not doing any dressing changes. She states she has home health but they have not come out. She is going to let us know which home health agency she has been approved for so we can send orders. Currently she denies signs of infection. 12/13; patient presents for follow-up. Patient has home health and they are coming out  once a week. It appears that there has not been  any dressing changes except for with home health. Patient has a newly discovered wound to the left foot. There is exposed bone. There is slight erythema and increased warmth to the surrounding tissue. Patient is completely unaware of this wound. 12/20; patient presents for follow-up. She has been taking her oral antibiotics. She now has an eschar and wound to the right hip. She has been using Dakin's wet-to-dry dressings to the left foot wound and buttocks wound. She has been using Medihoney and silver alginate to the right heel wound. She currently denies systemic signs of infection. She is mainly bedbound or in a wheelchair. 1/10; patient presents for follow-up. Patient had her left second toenail removed by Dr. Ether Griffins, podiatry on 12/21. Unfortunately she did not feel well and she was admitted to the hospital for sepsis. Her right heel wound was thought to be infected and this was debrided in the OR by Dr. Ether Griffins. Culture and bone biopsy had no growth. She has been using Medihoney to all the wound beds except for the lefty buttocks wound for which she uses Dakin's wet-to-dry dressings. She has developed a pressure injury to the left heel now. This is despite having Prevalon boots. Although she is not wearing them today. She has completed 4 weeks of Augmentin and also a week of doxycycline for osteomyelitis of the left foot. Her left foot wound no longer probes to bone. Currently she denies signs of infection. 1/24; patient presents for follow-up. She has been using Medihoney and Hydrofera Blue to the wound beds except for this buttocks wound she has been using Dakin's wet-to-dry dressings. She has developed 2 new wounds 1 to the left heel and the other to the right medial ankle. She claims she is wearing her Prevalon boots all the time although she does not have them on today. She currently denies signs of infection. 2/21; patient presents for  follow-up. She was recently hospitalized on 12/19/2022 for sepsis secondary to Enterococcus bacillus bacteremia. She was treated with IV antibiotics and has completed her discharge oral antibiotics. She had a CT scanning of the abdomen/pelvis without acute abnormalities. Husband is present with patient today. They have been using Dakin's wet-to-dry dressings to the sacral wound and hip wound and Hydrofera Blue and Medihoney to the feet wounds. They are not changing the dressings daily. It is unclear how often they actually change the dressings. She has been wearing her Prevalon boots at night but not during the day. She currently denies systemic signs of infection. 3/20; patient presents for follow-up. She again was in the hospital for severe sepsis on 2/24; MRIs at that time showed findings suspicious for osteomyelitis to the right hip. She is currently on Augmentin to complete 4 weeks of this. She is following with ID. She has been using Dakin's wet-to-dry dressings to the sacrum and right hip however the solution is starting to cause discomfort. T the rest of the wound she has been using Hydrofera Blue and Medihoney. Patient o and son are not interested in doing palliative care or hospice. They state that they had discussions while in the hospital. 4/17; patient presents for follow-up. She has been using Vashe wet-to-dry dressings to the sacrum and right trochanter wound. She has been using Medihoney and Hydrofera Blue to the bilateral feet wounds. She has no issues or complaints today. She denies signs of infection. 5/1; patient developed a new wound to the right leg. Son was present and states that there was a scab and he removed it  creating a wound. He has been keeping the area covered. T the sacral and right trochanter wound they have been using Vashe wet-to-dry dressings and Medihoney and Hydrofera Blue to the o bilateral feet wounds. She denies signs of infection. 6/1; 1 month follow-up. She  has a new area on the left anterior lower leg. In the meantime the sacral ulcer is closed. She still has an extensive wound on the right hip with exposed tendon over the greater trochanter. On the lower extremities the left anterior lower leg wound is new right ankle left heel and right heel all look reasonably stable. We are using Vashe wet-to-dry on the hip Medihoney and Hydrofera Blue on the lower extremities. She is accompanied by her son I think who is doing the dressings. They do not have a pressure-relief surface at home but they do have a hospital bed 7/10; I have not seen this patient since 03/13/2023. She was scheduled to be seen on 5/29 however missed her appointment due to illness. She was seen on 6/5 by Dr. Leanord Hawking and she presents today for her follow-up. Unfortunately her wounds have declined since I last saw her. She has several new wounds including one to the left hip wound and one left anterior lower leg. The right hip wound has declined and has maggots with bone exposed. She has been using Vashe wet- to-dry dressings to Right hip wound and Medihoney and Hydrofera Blue to the The remaining wounds. Sacral wound remains closed. 7/24; patient presents for follow-up. At last clinic visit I recommended she go to the ED for potential admission, IV antibiotics and further imaging. She had MRI of the pelvis and left foot that showed osteomyelitis. Podiatry advised conservative management. She was also seen by infectious disease who discharged patient on 2 weeks of linezolid and 2 weeks of ciprofloxacin. Patient is at home and has home health. Palliative care was consulted during the stay and she is full code and has declined further outpatient palliative care and hospice services.. She currently denies systemic signs of infection. Overall there is improvement in the appearance of the wounds. There is healthier granulation tissue present. However this is overall poor prognosis case. She has  Prevalon boots but it is unclear if she is using these. She does not have them on today. She follows up with infectious disease next week. 8/7; Patient's been using Vashe wet-to-dry dressing to all the wound beds except for the left hip she has been using Santyl. She followed up with infectious disease on 8/1. Currently she is on linezolid and ciprofloxacin. They recommended lab work to see if she needs extension of her oral antibiotics. She currently denies systemic signs of infection. 8/21; patient presents for follow-up. She has been using Vashe wet-to-dry dressings to all wounds except the left hip she has been using Santyl. All wounds have improved in size and appearance. She has healthy granulation tissue present to all wounds except the left hip still has nonviable tissue throughout. She states she has completed her oral antibiotics per ID. She currently denies systemic signs of infection. Objective Constitutional Vitals Time Taken: 10:21 AM, Weight: 98 lbs, Temperature: 98.1 F, Pulse: 70 bpm, Respiratory Rate: 18 breaths/min, Blood Pressure: 112/68 mmHg. General Notes: T the lower extremities and feet there are open wounds with granulation tissue and nonviable tissue. Postdebridement there is mainly o granulation tissue present. T the right hip there is an open wound with granulation tissue. No obvious probing to bone today. T the left trochanter  there is an o o open wound with nonviable tissue throughout. No signs of surrounding soft tissue infection to any of the wound beds. Integumentary (Hair, Skin) Wound #11 status is Open. Original cause of wound was Pressure Injury. The date acquired was: 10/24/2022. The wound has been in treatment 35 weeks. The [name], [name] ([id number]) [id number] Page 12 of 16 wound is located on the Right Trochanter. The wound measures 2.5cm length x 3.5cm width x 0.7cm depth; 6.872cm^2 area and 4.811cm^3 volume. There  is muscle, tendon, Fat Layer (Subcutaneous Tissue), and fascia exposed. There is undermining starting at 12:00 and ending at 12:00 with a maximum distance of 2cm. There is a medium amount of serosanguineous drainage noted. The wound margin is well defined and not attached to the wound base. There is large (67- 100%) red granulation within the wound bed. There is no necrotic tissue within the wound bed. Wound #13 status is Open. Original cause of wound was Pressure Injury. The date acquired was: 11/28/2022. The wound has been in treatment 30 weeks. The wound is located on the Left Calcaneus. The wound measures 0.8cm length x 1.5cm width x 0.3cm depth; 0.942cm^2 area and 0.283cm^3 volume. There is Fat Layer (Subcutaneous Tissue) exposed. There is a medium amount of serosanguineous drainage noted. The wound margin is flat and intact. There is medium (34-66%) pink granulation within the wound bed. There is a medium (34-66%) amount of necrotic tissue within the wound bed including Eschar and Adherent Slough. Wound #14 status is Open. Original cause of wound was Gradually Appeared. The date acquired was: 03/10/2023. The wound has been in treatment 16 weeks. The wound is located on the Right,Distal,Medial Lower Leg. The wound measures 9cm length x 4.6cm width x 0.2cm depth; 32.515cm^2 area and 6.503cm^3 volume. There is Fat Layer (Subcutaneous Tissue) exposed. There is a large amount of serosanguineous drainage noted. There is large (67-100%) red granulation within the wound bed. There is a small (1-33%) amount of necrotic tissue within the wound bed including Adherent Slough. Wound #15 status is Open. Original cause of wound was Pressure Injury. The date acquired was: 04/11/2023. The wound has been in treatment 11 weeks. The wound is located on the Left,Anterior Lower Leg. The wound measures 3cm length x 2cm width x 0.1cm depth; 4.712cm^2 area and 0.471cm^3 volume. There is tendon and Fat Layer (Subcutaneous  Tissue) exposed. There is a medium amount of serosanguineous drainage noted. There is large (67-100%) red granulation within the wound bed. There is a small (1-33%) amount of necrotic tissue within the wound bed including Adherent Slough. Wound #16 status is Open. Original cause of wound was Pressure Injury. The date acquired was: 05/22/2023. The wound has been in treatment 6 weeks. The wound is located on the Left Trochanter. The wound measures 1.6cm length x 2cm width x 0.5cm depth; 2.513cm^2 area and 1.257cm^3 volume. There is Fat Layer (Subcutaneous Tissue) exposed. There is a medium amount of serosanguineous drainage noted. There is a large (67-100%) amount of necrotic tissue within the wound bed including Eschar and Adherent Slough. Wound #6 status is Open. Original cause of wound was Pressure Injury. The date acquired was: 07/23/2022. The wound has been in treatment 40 weeks. The wound is located on the Right Calcaneus. The wound measures 3.5cm length x 2.3cm width x 0.2cm depth; 6.322cm^2 area and 1.264cm^3 volume. There is Fat Layer (Subcutaneous Tissue) exposed. There is a medium amount of serosanguineous drainage noted. The wound margin is distinct with the outline attached to  the wound base. There is medium (34-66%) red, pink granulation within the wound bed. There is a medium (34-66%) amount of necrotic tissue within the wound bed including Eschar and Adherent Slough. Assessment Active Problems ICD-10 Non-pressure chronic ulcer of other part of left lower leg with fat layer exposed Non-pressure chronic ulcer of other part of right lower leg with fat layer exposed Chronic venous hypertension (idiopathic) with ulcer of left lower extremity Lymphedema, not elsewhere classified Type 2 diabetes mellitus with other skin ulcer Pressure ulcer of right heel, unstageable Pressure ulcer of right ankle, stage 3 Pressure ulcer of left heel, unstageable Other acute osteomyelitis, left ankle and  foot Pressure ulcer of right hip, stage 4 Pressure ulcer of left hip, unstageable Chronic diastolic (congestive) heart failure Long term (current) use of oral hypoglycemic drugs Overall patient's wounds appear well-healing. I debrided nonviable tissue. I recommended continuing Santyl on the left hip and Vashe wet-to-dry dressings to the remaining wounds. Discussed the importance of aggressive offloading for continued wound healing. She has home health and we have tried to order a group to air mattress. Patient is to let us know if she does not receive this. Follow-up in 2 weeks. Procedures Wound #13 Pre-procedure diagnosis of Wound #13 is a Pressure Ulcer located on the Left Calcaneus . There was a Excisional Skin/Subcutaneous Tissue Debridement with a total area of 0.94 sq cm performed by Geralyn Corwin, MD. With the following instrument(s): Curette to remove Viable and Non-Viable tissue/material. Material removed includes Subcutaneous Tissue and Slough and. A time out was conducted at 10:56, prior to the start of the procedure. A Minimum amount of bleeding was controlled with Pressure. The procedure was tolerated well. Post Debridement Measurements: 0.8cm length x 1.5cm width x 0.3cm depth; 0.283cm^3 volume. Post debridement Stage noted as Category/Stage III. Character of Wound/Ulcer Post Debridement is stable. Post procedure Diagnosis Wound #13: Same as Pre-Procedure Wound #14 Pre-procedure diagnosis of Wound #14 is a Diabetic Wound/Ulcer of the Lower Extremity located on the Right,Distal,Medial Lower Leg .Severity of Tissue Pre Debridement is: Fat layer exposed. There was a Excisional Skin/Subcutaneous Tissue Debridement with a total area of 32.5 sq cm performed by Geralyn Corwin, MD. With the following instrument(s): Curette to remove Viable and Non-Viable tissue/material. Material removed includes Subcutaneous Tissue and Slough and. A time out was conducted at 10:52, prior to the start  of the procedure. A Minimum amount of bleeding was controlled with Pressure. The procedure was tolerated well. Post Debridement Measurements: 9cm length x 4.6cm width x 0.2cm depth; 6.503cm^3 volume. Character of Wound/Ulcer Post Debridement is stable. Severity of Tissue Post Debridement is: Fat layer exposed. Post procedure Diagnosis Wound #14: Same as Pre-Procedure Wound #16 Pre-procedure diagnosis of Wound #16 is a Pressure Ulcer located on the Left Trochanter . There was a Selective/Open Wound Non-Viable Tissue Debridement with a total area of 2.89 sq cm performed by Geralyn Corwin, MD. With the following instrument(s): Curette to remove Viable and Non-Viable tissue/material. Material removed includes Slough. A time out was conducted at 10:56, prior to the start of the procedure. A Minimum amount of bleeding was [name], [name] ([id number]) [id number] Page 13 of 16 controlled with Pressure. The procedure was tolerated well. Post Debridement Measurements: 1.6cm length x 2.3cm width x 0.6cm depth; 1.734cm^3 volume. Post debridement Stage noted as Unstageable/Unclassified. Character of Wound/Ulcer Post Debridement is stable. Post procedure Diagnosis Wound #16: Same as Pre-Procedure Wound #6 Pre-procedure diagnosis of Wound #6 is a Pressure Ulcer located on the Right Calcaneus .  There was a Excisional Skin/Subcutaneous Tissue Debridement with a total area of 6.32 sq cm performed by Geralyn Corwin, MD. With the following instrument(s): Curette to remove Viable and Non-Viable tissue/material. Material removed includes Subcutaneous Tissue and Slough and. A time out was conducted at 10:55, prior to the start of the procedure. A Minimum amount of bleeding was controlled with Pressure. The procedure was tolerated well. Post Debridement Measurements: 3.5cm length x 2.3cm width x 0.3cm depth; 1.897cm^3 volume. Post debridement Stage noted as Category/Stage  III. Character of Wound/Ulcer Post Debridement is stable. Post procedure Diagnosis Wound #6: Same as Pre-Procedure Plan Follow-up Appointments: Return Appointment in 2 weeks. Home Health: [facility] for wound care. May utilize formulary equivalent dressing for wound treatment orders unless otherwise specified. Home Health Nurse may visit PRN to address patients wound care needs. - left trochanter, right heel- santyl daily all other wounds- vashe damp tp dry daily Please follow up on group 2 low air loss mattress Off-Loading: Low air-loss mattress (Group 2) WOUND #11: - Trochanter Wound Laterality: Right Cleanser: Vashe 5.8 (oz) (Home Health) 1 x Per Day/30 Days Discharge Instructions: damp to dry daily Secondary Dressing: (BORDER) Zetuvit Plus SILICONE BORDER Dressing 5x5 (in/in) 1 x Per Day/30 Days Discharge Instructions: Please do not put silicone bordered dressings under wraps. Use non-bordered dressing only. WOUND #13: - Calcaneus Wound Laterality: Left Cleanser: Vashe 5.8 (oz) (Home Health) 1 x Per Day/30 Days Discharge Instructions: damp to dry daily Secondary Dressing: ABD Pad 5x9 (in/in) 1 x Per Day/30 Days Discharge Instructions: Cover with ABD pad Secondary Dressing: Kerlix 4.5 x 4.1 (in/yd) 1 x Per Day/30 Days Discharge Instructions: Apply Kerlix 4.5 x 4.1 (in/yd) as instructed Secured With: ACE WRAP - 7M ACE Elastic Bandage With VELCRO Brand Closure, 4 (in) 1 x Per Day/30 Days WOUND #14: - Lower Leg Wound Laterality: Right, Medial, Distal Cleanser: Vashe 5.8 (oz) (Home Health) 1 x Per Day/30 Days Discharge Instructions: damp to dry daily Secondary Dressing: ABD Pad 5x9 (in/in) 1 x Per Day/30 Days Discharge Instructions: Cover with ABD pad Secondary Dressing: Kerlix 4.5 x 4.1 (in/yd) 1 x Per Day/30 Days Discharge Instructions: Apply Kerlix 4.5 x 4.1 (in/yd) as instructed Secured With: ACE WRAP - 7M ACE Elastic Bandage With VELCRO Brand Closure, 4 (in) 1 x Per  Day/30 Days WOUND #15: - Lower Leg Wound Laterality: Left, Anterior Cleanser: Vashe 5.8 (oz) (Home Health) 1 x Per Day/30 Days Discharge Instructions: damp to dry daily Secondary Dressing: ABD Pad 5x9 (in/in) 1 x Per Day/30 Days Discharge Instructions: Cover with ABD pad Secondary Dressing: Kerlix 4.5 x 4.1 (in/yd) 1 x Per Day/30 Days Discharge Instructions: Apply Kerlix 4.5 x 4.1 (in/yd) as instructed Secured With: ACE WRAP - 7M ACE Elastic Bandage With VELCRO Brand Closure, 4 (in) 1 x Per Day/30 Days WOUND #16: - Trochanter Wound Laterality: Left Cleanser: Normal Saline (Home Health) 1 x Per Day/30 Days Discharge Instructions: Wash your hands with soap and water. Remove old dressing, discard into plastic bag and place into trash. Cleanse the wound with Normal Saline prior to applying a clean dressing using gauze sponges, not tissues or cotton balls. Do not scrub or use excessive force. Pat dry using gauze sponges, not tissue or cotton balls. Topical: Santyl Collagenase Ointment, 30 (gm), tube (Home Health) 1 x Per Day/30 Days Discharge Instructions: apply nickel thick to wound bed only Prim Dressing: Gauze 1 x Per Day/30 Days ary Discharge Instructions: Vashe damp to dry over Santyl Secondary Dressing: (BORDER)  Zetuvit Plus SILICONE BORDER Dressing 4x4 (in/in) (Home Health) 1 x Per Day/30 Days Discharge Instructions: Please do not put silicone bordered dressings under wraps. Use non-bordered dressing only. WOUND #6: - Calcaneus Wound Laterality: Right Cleanser: Vashe 5.8 (oz) (Home Health) 1 x Per Day/30 Days Discharge Instructions: damp to dry daily over Santyl Secondary Dressing: ABD Pad 5x9 (in/in) 1 x Per Day/30 Days Discharge Instructions: Cover with ABD pad Secondary Dressing: Kerlix 4.5 x 4.1 (in/yd) 1 x Per Day/30 Days Discharge Instructions: Apply Kerlix 4.5 x 4.1 (in/yd) as instructed Secured With: ACE WRAP - 50M ACE Elastic Bandage With VELCRO Brand Closure, 4 (in) 1 x Per  Day/30 Days 1. In office sharp debridement 2. Vashe wet-to-dry dressings 3. Santyl 4. Aggressive offloading 5. Follow-up in 2 weeks Electronic Signature(s) [name], [name] ([id number]) [id number] Page 14 of 16 Signed: 07/03/2023 12:05:18 PM By: Geralyn Corwin DO Entered By: Geralyn Corwin on 07/03/2023 08:37:14 -------------------------------------------------------------------------------- ROS/PFSH Details Patient Name: Date of Service: [facility], [name] 07/03/2023 10:00 A M Medical Record Number: [id number] Patient Account Number: [id number] Date of Birth/Sex: Treating RN: 10-14-1941 (82 y.o. [name] Primary Care Provider: Barbette Reichmann Other Clinician: Betha Loa Referring Provider: Treating Provider/Extender: Adolph Pollack Weeks in Treatment: 40 Information Obtained From Patient Eyes Medical History: Positive for: Cataracts - removed 2 years ago Negative for: Glaucoma; Optic Neuritis Ear/Nose/Mouth/Throat Medical History: Negative for: Chronic sinus problems/congestion; Middle ear problems Hematologic/Lymphatic Medical History: Positive for: Lymphedema Negative for: Anemia; Hemophilia; Human Immunodeficiency Virus; Sickle Cell Disease Respiratory Medical History: Negative for: Aspiration; Asthma; Chronic Obstructive Pulmonary Disease (COPD); Pneumothorax; Sleep Apnea; Tuberculosis Cardiovascular Medical History: Positive for: Hypertension; Peripheral Arterial Disease; Peripheral Venous Disease Gastrointestinal Medical History: Negative for: Cirrhosis ; Colitis; Crohns; Hepatitis A; Hepatitis B; Hepatitis C Endocrine Medical History: Positive for: Type II Diabetes Time with diabetes: 8years Treated with: Oral agents Blood sugar tested every day: No Genitourinary Medical History: Past Medical History Notes: Hx of Kidney stones, hysterectomy Musculoskeletal Medical HistoryLAIAH, [name] ([id number]) [id number] Page 15 of 16 Positive for: Osteoarthritis Past Medical History Notes: Rotator cuff disorder Neurologic Medical History: Positive for: Neuropathy Negative for: Dementia; Quadriplegia; Paraplegia; Seizure Disorder Oncologic Medical History: Past Medical History Notes: Breast Ca Psychiatric Medical History: Negative for: Anorexia/bulimia; Confinement Anxiety HBO Extended History Items Eyes: Cataracts Immunizations Pneumococcal Vaccine: Received Pneumococcal Vaccination: Yes Received Pneumococcal Vaccination On or After 60th Birthday: Yes Implantable Devices None Family and Social History Cancer: No; Diabetes: No; Heart Disease: Yes - Mother,Father; Hereditary Spherocytosis: No; Hypertension: Yes - Mother,Father; Kidney Disease: No; Lung Disease: No; Seizures: No; Stroke: Yes - Mother,Father; Thyroid Problems: No; Tuberculosis: No; Never smoker; Marital Status - Married; Alcohol Use: Never; Drug Use: No History; Caffeine Use: Daily; Financial Concerns: No; Food, Clothing or Shelter Needs: No; Support System Lacking: No; Transportation Concerns: No Electronic Signature(s) Signed: 07/03/2023 12:05:18 PM By: Geralyn Corwin DO Signed: 07/12/2023 11:59:03 AM By: Yevonne Pax RN Entered By: Geralyn Corwin on 07/03/2023 08:37:47 -------------------------------------------------------------------------------- SuperBill Details Patient Name: Date of Service: [facility], [name] 07/03/2023 Medical Record Number: [id number] Patient Account Number: [id number] Date of Birth/Sex: Treating RN: May 23, 1941 (82 y.o. [name] Primary Care Provider: Barbette Reichmann Other Clinician: Betha Loa Referring Provider: Treating Provider/Extender: Adolph Pollack Weeks in Treatment: 40 Diagnosis Coding ICD-10 Codes Code Description [phone] Non-pressure chronic ulcer of other part of left lower leg with  fat layer exposed L97.812 Non-pressure chronic ulcer of other part of right lower leg  with fat layer exposed Khiev, Rachael [name] ([id number]) [id number] Page 16 of 16 I87.312 Chronic venous hypertension (idiopathic) with ulcer of left lower extremity I89.0 Lymphedema, not elsewhere classified E11.622 Type 2 diabetes mellitus with other skin ulcer I50.32 Chronic diastolic (congestive) heart failure L89.320 Pressure ulcer of left buttock, unstageable L89.610 Pressure ulcer of right heel, unstageable S91.302A Unspecified open wound, left foot, initial encounter M86.172 Other acute osteomyelitis, left ankle and foot L89.214 Pressure ulcer of right hip, stage 4 L89.513 Pressure ulcer of right ankle, stage 3 L89.620 Pressure ulcer of left heel, unstageable L89.220 Pressure ulcer of left hip, unstageable S81.802A Unspecified open wound, left lower leg, initial encounter Z79.84 Long term (current) use of oral hypoglycemic drugs Facility Procedures : CPT4 Code: 18841660 Description: 11042 - DEB SUBQ TISSUE 20 SQ CM/< ICD-10 Diagnosis Description L97.822 Non-pressure chronic ulcer of other part of left lower leg with fat layer expose L97.812 Non-pressure chronic ulcer of other part of right lower leg with fat layer expos  I87.312 Chronic venous hypertension (idiopathic) with ulcer of left lower extremity E11.622 Type 2 diabetes mellitus with other skin ulcer Modifier: d ed Quantity: 1 : CPT4 Code: 63016010 Description: 11045 - DEB SUBQ TISS EA ADDL 20CM ICD-10 Diagnosis Description L97.822 Non-pressure chronic ulcer of other part of left lower leg with fat layer expose L97.812 Non-pressure chronic ulcer of other part of right lower leg with fat layer expos  I87.312 Chronic venous hypertension (idiopathic) with ulcer of left lower extremity E11.622 Type 2 diabetes mellitus with other skin ulcer Modifier: d ed Quantity: 1 : CPT4 Code: [id number] Description: 97597 -  DEBRIDE WOUND 1ST 20 SQ CM OR < ICD-10 Diagnosis Description L89.220 Pressure ulcer of left hip, unstageable Modifier: Quantity: 1 Physician Procedures : CPT4 Code Description Modifier [id number] 11042 - WC PHYS SUBQ TISS 20 SQ CM ICD-10 Diagnosis Description L97.822 Non-pressure chronic ulcer of other part of left lower leg with fat layer exposed L97.812 Non-pressure chronic ulcer of other part of right  lower leg with fat layer exposed I87.312 Chronic venous hypertension (idiopathic) with ulcer of left lower extremity E11.622 Type 2 diabetes mellitus with other skin ulcer Quantity: 1 : [id number] 11045 - WC PHYS SUBQ TISS EA ADDL 20 CM ICD-10 Diagnosis Description L97.822 Non-pressure chronic ulcer of other part of left lower leg with fat layer exposed L97.812 Non-pressure chronic ulcer of other part of right lower leg with fat layer  exposed I87.312 Chronic venous hypertension (idiopathic) with ulcer of left lower extremity E11.622 Type 2 diabetes mellitus with other skin ulcer Quantity: 1 : [id number] 97597 - WC PHYS DEBR WO ANESTH 20 SQ CM ICD-10 Diagnosis Description L89.220 Pressure ulcer of left hip, unstageable Quantity: 1 Electronic Signature(s) Signed: 07/03/2023 12:05:18 PM By: Geralyn Corwin DO Entered By: Geralyn Corwin on 07/03/2023 08:37:23

## 2023-07-12 NOTE — Progress Notes (Signed)
[name], [name] ([id number]) [id number] Page 1 of 15 Visit Report for 07/03/2023 Arrival Information Details Patient Name: Date of Service: [name], [name]. 07/03/2023 10:00 A M Medical Record Number: [id number] Patient Account Number: [id number] Date of Birth/Sex: Treating RN: 12-30-1940 (82 y.o. [name] Primary Care [name]: [name] Other Clinician: Betha [name] Referring [name]: Treating [name]/Extender: [name] Weeks in Treatment: 40 Visit Information History Since Last Visit All ordered tests and consults were completed: No Patient Arrived: Wheel Chair Added or deleted any medications: No Arrival Time: 10:12 Any new allergies or adverse reactions: No Transfer Assistance: EasyPivot Patient Lift Had a fall or experienced change in No Patient Identification Verified: Yes activities of daily living that may affect Secondary Verification Process Completed: Yes risk of falls: Patient Requires Transmission-Based Precautions: No Signs or symptoms of abuse/neglect since last visito No Patient Has Alerts: Yes Hospitalized since last visit: No Patient Alerts: DM II Implantable device outside of the clinic excluding No ABI R 1.30 TBI 1.12 cellular tissue based products placed in the center ABI [name] 1.27 TBI 1.21 since last visit: Has Dressing in Place as Prescribed: Yes Pain Present Now: Yes Electronic Signature(s) Signed: 07/03/2023 5:02:30 PM By: [name] Entered By: [name] on 07/03/2023 07:20:42 -------------------------------------------------------------------------------- Clinic Level of Care Assessment Details Patient Name: Date of Service: [name], [name]. 07/03/2023 10:00 A M Medical Record Number: [id number] Patient Account Number: [id number] Date of Birth/Sex: Treating RN: 11/06/1941 (82 y.o. [name] Primary Care [name]: [name] Other Clinician:  Betha [name] Referring [name]: Treating [name]/Extender: [name] Weeks in Treatment: 40 Clinic Level of Care Assessment Items TOOL 1 Quantity Score []  - 0 Use when EandM and Procedure is performed on INITIAL visit ASSESSMENTS - Nursing Assessment / Reassessment []  - 0 General Physical Exam (combine w/ comprehensive assessment (listed just below) when performed on new pt. evals) []  - 0 Comprehensive Assessment (HX, ROS, Risk Assessments, Wounds Hx, etc.) [name], [name] ([id number]) [id number] Page 2 of 15 ASSESSMENTS - Wound and Skin Assessment / Reassessment []  - 0 Dermatologic / Skin Assessment (not related to wound area) ASSESSMENTS - Ostomy and/or Continence Assessment and Care []  - 0 Incontinence Assessment and Management []  - 0 Ostomy Care Assessment and Management (repouching, etc.) PROCESS - Coordination of Care []  - 0 Simple Patient / Family Education for ongoing care []  - 0 Complex (extensive) Patient / Family Education for ongoing care []  - 0 Staff obtains Chiropractor, Records, T Results / Process Orders est []  - 0 Staff telephones HHA, Nursing Homes / Clarify orders / etc []  - 0 Routine Transfer to another Facility (non-emergent condition) []  - 0 Routine Hospital Admission (non-emergent condition) []  - 0 New Admissions / Manufacturing engineer / Ordering NPWT Apligraf, etc. , []  - 0 Emergency Hospital Admission (emergent condition) PROCESS - Special Needs []  - 0 Pediatric / Minor Patient Management []  - 0 Isolation Patient Management []  - 0 Hearing / Language / Visual special needs []  - 0 Assessment of Community assistance (transportation, D/C planning, etc.) []  - 0 Additional assistance / Altered mentation []  - 0 Support Surface(s) Assessment (bed, cushion, seat, etc.) INTERVENTIONS - Miscellaneous []  - 0 External ear exam []  - 0 Patient Transfer (multiple staff / Nurse, adult / Similar  devices) []  - 0 Simple Staple / Suture removal (25 or less) []  - 0 Complex Staple / Suture removal (26 or more) []  - 0 Hypo/Hyperglycemic Management (do not check if  billed separately) []  - 0 Ankle / Brachial Index (ABI) - do not check if billed separately Has the patient been seen at the hospital within the last three years: Yes Total Score: 0 Level Of Care: ____ Electronic Signature(s) Signed: 07/03/2023 5:02:30 PM By: [name] Entered By: [name] on 07/03/2023 07:58:07 -------------------------------------------------------------------------------- Encounter Discharge Information Details Patient Name: Date of Service: [name], [name]. 07/03/2023 10:00 A M Medical Record Number: [id number] Patient Account Number: [id number] Date of Birth/Sex: Treating RN: 03-24-1941 (82 y.o. [name] Primary Care [name]: [name] Other Clinician: Betha [name] Referring [name]: Treating [name]/Extender: [name] [name], [name] ([id number]) [id number] Page 3 of 15 Weeks in Treatment: 40 Encounter Discharge Information Items Post Procedure Vitals Discharge Condition: Stable Temperature (F): 98.1 Ambulatory Status: Wheelchair Pulse (bpm): 70 Discharge Destination: Home Respiratory Rate (breaths/min): 18 Transportation: Private Auto Blood Pressure (mmHg): 112/68 Accompanied By: son Schedule Follow-up Appointment: Yes Clinical Summary of Care: Electronic Signature(s) Signed: 07/03/2023 5:02:30 PM By: [name] Entered By: [name] on 07/03/2023 10:00:24 -------------------------------------------------------------------------------- Lower Extremity Assessment Details Patient Name: Date of Service: [name], [name]. 07/03/2023 10:00 A M Medical Record Number: [id number] Patient Account Number: [id number] Date of Birth/Sex: Treating RN: 08-Feb-1941 (82 y.o. [name] Primary  Care Louretta Tantillo: [name] Other Clinician: Betha [name] Referring Vitor Overbaugh: Treating Jetson Pickrel/Extender: Marcha Dutton, Vishwanath Weeks in Treatment: 40 Electronic Signature(s) Signed: 07/03/2023 5:02:30 PM By: [name] Signed: 07/12/2023 11:59:03 AM By: Yevonne Pax RN Entered By: [name] on 07/03/2023 07:46:44 -------------------------------------------------------------------------------- Multi Wound Chart Details Patient Name: Date of Service: [facility] [name], [name]. 07/03/2023 10:00 A M Medical Record Number: [id number] Patient Account Number: [id number] Date of Birth/Sex: Treating RN: Aug 31, 1941 (82 y.o. [name] Primary Care Courtni Balash: [name] Other Clinician: Betha [name] Referring Hildred Mollica: Treating Gladyse Corvin/Extender: [name] Weeks in Treatment: 40 Vital Signs Height(in): Pulse(bpm): 70 Weight(lbs): 98 Blood Pressure(mmHg): 112/68 Body Mass Index(BMI): Temperature(F): 98.1 Respiratory Rate(breaths/min): 18 [Olveda, Maritssa [name] ([id number]):Photos:] [[id number] Page 4 of 15:11 13 14] Right Trochanter Left Calcaneus Right, Distal, Medial Lower Leg Wound Location: Pressure Injury Pressure Injury Gradually Appeared Wounding Event: Pressure Ulcer Pressure Ulcer Diabetic Wound/Ulcer of the Lower Primary Etiology: Extremity Cataracts, Lymphedema, Cataracts, Lymphedema, Cataracts, Lymphedema, Comorbid History: Hypertension, Peripheral Arterial Hypertension, Peripheral Arterial Hypertension, Peripheral Arterial Disease, Peripheral Venous Disease, Disease, Peripheral Venous Disease, Disease, Peripheral Venous Disease, Type II Diabetes, Osteoarthritis, Type II Diabetes, Osteoarthritis, Type II Diabetes, Osteoarthritis, Neuropathy Neuropathy Neuropathy 10/24/2022 11/28/2022 03/10/2023 Date Acquired: 35 30 16 Weeks of Treatment: Open Open Open Wound Status: No No No Wound  Recurrence: No No Yes Clustered Wound: 2.5x3.5x0.7 0.8x1.5x0.3 9x4.6x0.2 Measurements [name] x W x D (cm) 6.872 0.942 32.515 A (cm) : rea 4.811 0.283 6.503 Volume (cm) : -231.50% 72.70% -491.40% % Reduction in A rea: -2224.20% 18.20% -1082.40% % Reduction in Volume: 12 Starting Position 1 (o'clock): 12 Ending Position 1 (o'clock): 2 Maximum Distance 1 (cm): Yes N/A N/A Undermining: Category/Stage IV Category/Stage III Grade 2 Classification: Medium Medium Large Exudate A mount: Serosanguineous Serosanguineous Serosanguineous Exudate Type: red, brown red, brown red, brown Exudate Color: Well defined, not attached Flat and Intact N/A Wound Margin: Large (67-100%) Medium (34-66%) Large (67-100%) Granulation A mount: Red Pink Red Granulation Quality: None Present (0%) Medium (34-66%) Small (1-33%) Necrotic A mount: N/A Eschar, Adherent Slough Adherent Slough Necrotic Tissue: Fascia: Yes Fat Layer (Subcutaneous Tissue): Yes Fat Layer (Subcutaneous Tissue): Yes Exposed Structures: Fat Layer (Subcutaneous Tissue): Yes  Fascia: No Fascia: No Tendon: Yes Tendon: No Tendon: No Muscle: Yes Muscle: No Muscle: No Joint: No Joint: No Joint: No Bone: No Bone: No Bone: No None None None Epithelialization: Wound Number: [id number]  Photos: Left, Anterior Lower Leg Left Trochanter Right Calcaneus Wound Location: Pressure Injury Pressure Injury Pressure Injury Wounding Event: Pressure Ulcer Pressure Ulcer Pressure Ulcer Primary Etiology: Cataracts, Lymphedema, Cataracts, Lymphedema, Cataracts, Lymphedema, Comorbid History: Hypertension, Peripheral Arterial Hypertension, Peripheral Arterial Hypertension, Peripheral Arterial Disease, Peripheral Venous Disease, Disease, Peripheral Venous Disease, Disease, Peripheral Venous Disease, Type II Diabetes, Osteoarthritis, Type II Diabetes, Osteoarthritis, Type II Diabetes, Osteoarthritis, Neuropathy Neuropathy Neuropathy 04/11/2023  05/22/2023 07/23/2022 Date Acquired: 11 6 40 Weeks of Treatment: Open Open Open Wound Status: No No No Wound Recurrence: No No No Clustered Wound: 3x2x0.1 1.6x2x0.5 3.5x2.3x0.2 Measurements [name] x W x D (cm) 4.712 2.513 6.322 A (cm) : rea 0.471 1.257 1.264 Volume (cm) : 79.00% 22.70% 32.90% % Reduction in A rea: 79.00% -286.80% 55.30% % Reduction in Volume: N/A N/A N/A Undermining: Category/Stage IV Unstageable/Unclassified Category/Stage III Classification: Medium Medium Medium Exudate A mount: Serosanguineous Serosanguineous Serosanguineous Exudate Type: red, brown red, brown red, brown Exudate Color: Crotwell, Shawntelle [name] ([id number]) [id number] Page 5 of 15 N/A N/A Distinct, outline attached Wound Margin: Large (67-100%) N/A Medium (34-66%) Granulation Amount: Red N/A Red, Pink Granulation Quality: Small (1-33%) N/A Medium (34-66%) Necrotic Amount: Adherent Slough Eschar, Adherent Slough Eschar, Adherent Slough Necrotic Tissue: Fat Layer (Subcutaneous Tissue): Yes Fat Layer (Subcutaneous Tissue): Yes Fat Layer (Subcutaneous Tissue): Yes Exposed Structures: Tendon: Yes Fascia: No Fascia: No Fascia: No Tendon: No Tendon: No Muscle: No Muscle: No Muscle: No Joint: No Joint: No Joint: No Bone: No Bone: No Bone: No None N/A None Epithelialization: Treatment Notes Electronic Signature(s) Signed: 07/03/2023 5:02:30 PM By: [name] Entered By: [name] on 07/03/2023 07:46:48 -------------------------------------------------------------------------------- Multi-Disciplinary Care Plan Details Patient Name: Date of Service: [facility], [name]. 07/03/2023 10:00 A M Medical Record Number: [id number] Patient Account Number: [id number] Date of Birth/Sex: Treating RN: 20-Dec-1940 (82 y.o. [name] Primary Care Sofi Bryars: [name] Other Clinician: Betha [name] Referring Shamir Sedlar: Treating Daanish Copes/Extender:  [name] Weeks in Treatment: 40 Active Inactive Electronic Signature(s) Signed: 07/03/2023 5:02:30 PM By: [name] Signed: 07/12/2023 11:59:03 AM By: Yevonne Pax RN Entered By: [name] on 07/03/2023 08:17:10 -------------------------------------------------------------------------------- Pain Assessment Details Patient Name: Date of Service: [facility], [name]. 07/03/2023 10:00 A M Medical Record Number: [id number] Patient Account Number: [id number] Date of Birth/Sex: Treating RN: 1941/02/16 (82 y.o. [name] Primary Care Saim Almanza: [name] Other Clinician: Betha [name] Referring Lacheryl Niesen: Treating Karanveer Ramakrishnan/Extender: [name] Weeks in Treatment: [address] CARLE, VANDAELE [name] ([id number]) [id number] Page 6 of 15 Location of Pain Severity and Description of Pain Patient Has Paino Yes Site Locations Pain Location: Generalized Pain, Pain in Ulcers Duration of the Pain. Constant / Intermittento Constant Rate the pain. Current Pain Level: 7 Character of Pain Describe the Pain: Aching, Burning, Other: stinging Pain Management and Medication Current Pain Management: Medication: Yes Cold Application: No Rest: No Massage: No Activity: No T.E.N.S.: No Heat Application: No Leg drop or elevation: No Is the Current Pain Management Adequate: Inadequate How does your wound impact your activities of daily livingo Sleep: No Bathing: No Appetite: No Relationship With Others: No Bladder Continence: No Emotions: No Bowel Continence: No Work: No Toileting: No Drive: No Dressing: No Hobbies: No Electronic Signature(s) Signed: 07/03/2023 5:02:30 PM By: [name]  Signed: 07/12/2023 11:59:03 AM By: Yevonne Pax RN Entered By: [name] on 07/03/2023 07:23:08 -------------------------------------------------------------------------------- Patient/Caregiver  Education Details Patient Name: Date of Service: [facility], [name]. 8/21/2024andnbsp10:00 A M Medical Record Number: [id number] Patient Account Number: [id number] Date of Birth/Gender: Treating RN: March 08, 1941 (82 y.o. [name] Primary Care Physician: [name] Other Clinician: Betha [name] Referring Physician: Treating Physician/Extender: [name] Weeks in Treatment: 66 Education Assessment Education Provided To: Patient and Caregiver [name], [name] ([id number]) [id number] Page 7 of 15 Education Topics Provided Wound/Skin Impairment: Handouts: Other: continue wound care as directed Methods: Explain/Verbal Responses: State content correctly Electronic Signature(s) Signed: 07/03/2023 5:02:30 PM By: [name] Entered By: [name] on 07/03/2023 09:59:08 -------------------------------------------------------------------------------- Wound Assessment Details Patient Name: Date of Service: [facility], [name]. 07/03/2023 10:00 A M Medical Record Number: [id number] Patient Account Number: [id number] Date of Birth/Sex: Treating RN: [birth year] (82 y.o. [name] Primary Care Mj Willis: [name] Other Clinician: Betha [name] Referring Marri Mcneff: Treating Brynden Thune/Extender: [name] Weeks in Treatment: 40 Wound Status Wound Number: 11 Primary Pressure Ulcer Etiology: Wound Location: Right Trochanter Wound Open Wounding Event: Pressure Injury Status: Date Acquired: 10/24/2022 Comorbid Cataracts, Lymphedema, Hypertension, Peripheral Arterial Disease, Weeks Of Treatment: 35 History: Peripheral Venous Disease, Type II Diabetes, Osteoarthritis, Clustered Wound: No Neuropathy Photos Wound Measurements Length: (cm) 2.5 Width: (cm) 3.5 Depth: (cm) 0.7 Area: (cm) 6.872 Volume: (cm) 4.811 % Reduction in Area: -231.5% % Reduction in Volume:  -2224.2% Epithelialization: None Undermining: Yes Starting Position (o'clock): 12 Ending Position (o'clock): 12 Maximum Distance: (cm) 2 Wound Description Classification: Category/Stage IV Wound Margin: Well defined, not attached Exudate Amount: Medium Exudate Type: Serosanguineous Exudate Color: red, brown [name], [name] ([id number]) Wound Bed Granulation Amount: Large (67-100%) Granulation Quality: Red Necrotic Amount: None Present (0%) Foul Odor After Cleansing: No Slough/Fibrino Yes [id number] Page 8 of 15 Exposed Structure Fascia Exposed: Yes Fat Layer (Subcutaneous Tissue) Exposed: Yes Tendon Exposed: Yes Muscle Exposed: Yes Necrosis of Muscle: No Joint Exposed: No Bone Exposed: No Treatment Notes Wound #11 (Trochanter) Wound Laterality: Right Cleanser Vashe 5.8 (oz) Discharge Instruction: damp to dry daily Peri-Wound Care Topical Primary Dressing Secondary Dressing (BORDER) Zetuvit Plus SILICONE BORDER Dressing 5x5 (in/in) Discharge Instruction: Please do not put silicone bordered dressings under wraps. Use non-bordered dressing only. Secured With Compression Wrap Compression Stockings Facilities manager) Signed: 07/03/2023 5:02:30 PM By: [name] Signed: 07/12/2023 11:59:03 AM By: Yevonne Pax RN Entered By: [name] on 07/03/2023 07:46:06 -------------------------------------------------------------------------------- Wound Assessment Details Patient Name: Date of Service: [facility], [name]. 07/03/2023 10:00 A M Medical Record Number: [id number] Patient Account Number: [id number] Date of Birth/Sex: Treating RN: 1941/08/21 (82 y.o. [name] Primary Care Brayleigh Rybacki: [name] Other Clinician: Betha [name] Referring Anfernee Peschke: Treating Bernice Mcauliffe/Extender: [name] Weeks in Treatment: 40 Wound Status Wound Number: 13 Primary Pressure Ulcer Etiology: Wound  Location: Left Calcaneus Wound Open Wounding Event: Pressure Injury Status: Date Acquired: 11/28/2022 Comorbid Cataracts, Lymphedema, Hypertension, Peripheral Arterial Disease, Weeks Of Treatment: 30 History: Peripheral Venous Disease, Type II Diabetes, Osteoarthritis, Clustered Wound: No Neuropathy Photos [name], [name] ([id number]) [id number] Page 9 of 15 Wound Measurements Length: (cm) 0.8 Width: (cm) 1.5 Depth: (cm) 0.3 Area: (cm) 0.942 Volume: (cm) 0.283 % Reduction in Area: 72.7% % Reduction in Volume: 18.2% Epithelialization: None Wound Description Classification: Category/Stage III Wound Margin: Flat and Intact Exudate Amount: Medium Exudate Type: Serosanguineous Exudate Color: red, brown Foul Odor After Cleansing: No Slough/Fibrino  Yes Wound Bed Granulation Amount: Medium (34-66%) Exposed Structure Granulation Quality: Pink Fascia Exposed: No Necrotic Amount: Medium (34-66%) Fat Layer (Subcutaneous Tissue) Exposed: Yes Necrotic Quality: Eschar, Adherent Slough Tendon Exposed: No Muscle Exposed: No Joint Exposed: No Bone Exposed: No Treatment Notes Wound #13 (Calcaneus) Wound Laterality: Left Cleanser Vashe 5.8 (oz) Discharge Instruction: damp to dry daily Peri-Wound Care Topical Primary Dressing Secondary Dressing ABD Pad 5x9 (in/in) Discharge Instruction: Cover with ABD pad Kerlix 4.5 x 4.1 (in/yd) Discharge Instruction: Apply Kerlix 4.5 x 4.1 (in/yd) as instructed Secured With ACE WRAP - 52M ACE Elastic Bandage With VELCRO Brand Closure, 4 (in) Compression Wrap Compression Stockings Add-Ons Electronic Signature(s) Signed: 07/03/2023 5:02:30 PM By: [name] Signed: 07/12/2023 11:59:03 AM By: Yevonne Pax RN Entered By: [name] on 07/03/2023 07:44:40 [name], [name] ([id number]) [id number] Page 10 of  15 -------------------------------------------------------------------------------- Wound Assessment Details Patient Name: Date of Service: [facility], [name]. 07/03/2023 10:00 A M Medical Record Number: [id number] Patient Account Number: [id number] Date of Birth/Sex: Treating RN: October 06, 1941 (82 y.o. [name] Primary Care Candi Profit: [name] Other Clinician: Betha [name] Referring Sumer Moorehouse: Treating Damiana Berrian/Extender: [name] Weeks in Treatment: 40 Wound Status Wound Number: 14 Primary Diabetic Wound/Ulcer of the Lower Extremity Etiology: Wound Location: Right, Distal, Medial Lower Leg Wound Open Wounding Event: Gradually Appeared Status: Date Acquired: 03/10/2023 Comorbid Cataracts, Lymphedema, Hypertension, Peripheral Arterial Disease, Weeks Of Treatment: 16 History: Peripheral Venous Disease, Type II Diabetes, Osteoarthritis, Clustered Wound: Yes Neuropathy Photos Wound Measurements Length: (cm) 9 Width: (cm) 4.6 Depth: (cm) 0.2 Area: (cm) 32. Volume: (cm) 6.5 % Reduction in Area: -491.4% % Reduction in Volume: -1082.4% Epithelialization: None 515 03 Wound Description Classification: Grade 2 Exudate Amount: Large Exudate Type: Serosanguineous Exudate Color: red, brown Foul Odor After Cleansing: No Slough/Fibrino Yes Wound Bed Granulation Amount: Large (67-100%) Exposed Structure Granulation Quality: Red Fascia Exposed: No Necrotic Amount: Small (1-33%) Fat Layer (Subcutaneous Tissue) Exposed: Yes Necrotic Quality: Adherent Slough Tendon Exposed: No Muscle Exposed: No Joint Exposed: No Bone Exposed: No Treatment Notes Wound #14 (Lower Leg) Wound Laterality: Right, Medial, Distal Cleanser Vashe 5.8 (oz) Discharge Instruction: damp to dry daily [name], [name] ([id number]) [id number] Page 11 of 15 Peri-Wound Care Topical Primary Dressing Secondary Dressing ABD Pad 5x9  (in/in) Discharge Instruction: Cover with ABD pad Kerlix 4.5 x 4.1 (in/yd) Discharge Instruction: Apply Kerlix 4.5 x 4.1 (in/yd) as instructed Secured With ACE WRAP - 52M ACE Elastic Bandage With VELCRO Brand Closure, 4 (in) Compression Wrap Compression Stockings Add-Ons Electronic Signature(s) Signed: 07/03/2023 5:02:30 PM By: [name] Signed: 07/12/2023 11:59:03 AM By: Yevonne Pax RN Entered By: [name] on 07/03/2023 07:45:09 -------------------------------------------------------------------------------- Wound Assessment Details Patient Name: Date of Service: [facility], [name]. 07/03/2023 10:00 A M Medical Record Number: [id number] Patient Account Number: [id number] Date of Birth/Sex: Treating RN: Sep 19, 1941 (82 y.o. [name] Primary Care Shriya Aker: [name] Other Clinician: Betha [name] Referring Darleny Sem: Treating Orton Capell/Extender: [name] Weeks in Treatment: 40 Wound Status Wound Number: 15 Primary Pressure Ulcer Etiology: Wound Location: Left, Anterior Lower Leg Wound Open Wounding Event: Pressure Injury Status: Date Acquired: 04/11/2023 Comorbid Cataracts, Lymphedema, Hypertension, Peripheral Arterial Disease, Weeks Of Treatment: 11 History: Peripheral Venous Disease, Type II Diabetes, Osteoarthritis, Clustered Wound: No Neuropathy Photos Wound Measurements Length: (cm) 3 Width: (cm) 2 Depth: (cm) 0.1 Area: (cm) 4.712 [name], [name] ([id number]) Volume: (cm) 0.471 % Reduction in Area: 79% % Reduction in Volume: 79% Epithelialization: None [id number].pdf Page  12 of 15 Wound Description Classification: Category/Stage IV Exudate Amount: Medium Exudate Type: Serosanguineous Exudate Color: red, brown Foul Odor After Cleansing: No Slough/Fibrino Yes Wound Bed Granulation Amount: Large (67-100%) Exposed Structure Granulation Quality: Red Fascia Exposed: No Necrotic Amount:  Small (1-33%) Fat Layer (Subcutaneous Tissue) Exposed: Yes Necrotic Quality: Adherent Slough Tendon Exposed: Yes Muscle Exposed: No Joint Exposed: No Bone Exposed: No Treatment Notes Wound #15 (Lower Leg) Wound Laterality: Left, Anterior Cleanser Vashe 5.8 (oz) Discharge Instruction: damp to dry daily Peri-Wound Care Topical Primary Dressing Secondary Dressing ABD Pad 5x9 (in/in) Discharge Instruction: Cover with ABD pad Kerlix 4.5 x 4.1 (in/yd) Discharge Instruction: Apply Kerlix 4.5 x 4.1 (in/yd) as instructed Secured With ACE WRAP - 24M ACE Elastic Bandage With VELCRO Brand Closure, 4 (in) Compression Wrap Compression Stockings Add-Ons Electronic Signature(s) Signed: 07/03/2023 5:02:30 PM By: [name] Signed: 07/12/2023 11:59:03 AM By: Yevonne Pax RN Entered By: [name] on 07/03/2023 07:45:46 -------------------------------------------------------------------------------- Wound Assessment Details Patient Name: Date of Service: [facility], [name]. 07/03/2023 10:00 A M Medical Record Number: [id number] Patient Account Number: [id number] Date of Birth/Sex: Treating RN: 09-Jul-1941 (82 y.o. [name] Primary Care Karthik Whittinghill: [name] Other Clinician: Betha [name] Referring Cherese Lozano: Treating Lanier Millon/Extender: Marcha Dutton, Vishwanath Weeks in Treatment: 40 Wound Status Wound Number: 16 Primary Pressure Ulcer Etiology: [name], [name] ([id number]) [id number] Page 13 of 15 Etiology: Wound Location: Left Trochanter Wound Open Wounding Event: Pressure Injury Status: Date Acquired: 05/22/2023 Comorbid Cataracts, Lymphedema, Hypertension, Peripheral Arterial Disease, Weeks Of Treatment: 6 History: Peripheral Venous Disease, Type II Diabetes, Osteoarthritis, Clustered Wound: No Neuropathy Photos Wound Measurements Length: (cm) 1.6 Width: (cm) 2 Depth: (cm) 0.5 Area: (cm) 2.513 Volume: (cm) 1.257 %  Reduction in Area: 22.7% % Reduction in Volume: -286.8% Wound Description Classification: Unstageable/Unclassified Exudate Amount: Medium Exudate Type: Serosanguineous Exudate Color: red, brown Foul Odor After Cleansing: No Slough/Fibrino Yes Wound Bed Necrotic Amount: Large (67-100%) Exposed Structure Necrotic Quality: Eschar, Adherent Slough Fascia Exposed: No Fat Layer (Subcutaneous Tissue) Exposed: Yes Tendon Exposed: No Muscle Exposed: No Joint Exposed: No Bone Exposed: No Treatment Notes Wound #16 (Trochanter) Wound Laterality: Left Cleanser Normal Saline Discharge Instruction: Wash your hands with soap and water. Remove old dressing, discard into plastic bag and place into trash. Cleanse the wound with Normal Saline prior to applying a clean dressing using gauze sponges, not tissues or cotton balls. Do not scrub or use excessive force. Pat dry using gauze sponges, not tissue or cotton balls. Peri-Wound Care Topical Santyl Collagenase Ointment, 30 (gm), tube Discharge Instruction: apply nickel thick to wound bed only Primary Dressing Gauze Discharge Instruction: Vashe damp to dry over Santyl Secondary Dressing (BORDER) Zetuvit Plus SILICONE BORDER Dressing 4x4 (in/in) Discharge Instruction: Please do not put silicone bordered dressings under wraps. Use non-bordered dressing only. Secured With Compression Wrap Compression Stockings Add-Ons Electronic Signature(s) [name], [name] ([id number]) [id number] Page 14 of 15 Signed: 07/03/2023 5:02:30 PM By: [name] Signed: 07/12/2023 11:59:03 AM By: Yevonne Pax RN Entered By: [name] on 07/03/2023 07:46:34 -------------------------------------------------------------------------------- Wound Assessment Details Patient Name: Date of Service: [facility], [name]. 07/03/2023 10:00 A M Medical Record Number: [id number] Patient Account Number: [id number] Date of Birth/Sex: Treating  RN: 07/15/1941 (82 y.o. [name] Primary Care Treyvon Blahut: [name] Other Clinician: Betha [name] Referring Marien Manship: Treating Jannelle Notaro/Extender: [name] Weeks in Treatment: 40 Wound Status Wound Number: 6 Primary Pressure Ulcer Etiology: Wound Location: Right Calcaneus Wound Open Wounding Event: Pressure Injury  Status: Date Acquired: 07/23/2022 Comorbid Cataracts, Lymphedema, Hypertension, Peripheral Arterial Disease, Weeks Of Treatment: 40 History: Peripheral Venous Disease, Type II Diabetes, Osteoarthritis, Clustered Wound: No Neuropathy Photos Wound Measurements Length: (cm) 3.5 Width: (cm) 2.3 Depth: (cm) 0.2 Area: (cm) 6.322 Volume: (cm) 1.264 % Reduction in Area: 32.9% % Reduction in Volume: 55.3% Epithelialization: None Wound Description Classification: Category/Stage III Wound Margin: Distinct, outline attached Exudate Amount: Medium Exudate Type: Serosanguineous Exudate Color: red, brown Foul Odor After Cleansing: No Slough/Fibrino Yes Wound Bed Granulation Amount: Medium (34-66%) Exposed Structure Granulation Quality: Red, Pink Fascia Exposed: No Necrotic Amount: Medium (34-66%) Fat Layer (Subcutaneous Tissue) Exposed: Yes Necrotic Quality: Eschar, Adherent Slough Tendon Exposed: No Muscle Exposed: No Joint Exposed: No Bone Exposed: No Treatment Notes [name], [name] ([id number]) [id number] Page 15 of 15 Wound #6 (Calcaneus) Wound Laterality: Right Cleanser Vashe 5.8 (oz) Discharge Instruction: damp to dry daily over Santyl Peri-Wound Care Topical Primary Dressing Secondary Dressing ABD Pad 5x9 (in/in) Discharge Instruction: Cover with ABD pad Kerlix 4.5 x 4.1 (in/yd) Discharge Instruction: Apply Kerlix 4.5 x 4.1 (in/yd) as instructed Secured With ACE WRAP - 21M ACE Elastic Bandage With VELCRO Brand Closure, 4 (in) Compression Wrap Compression  Stockings Add-Ons Electronic Signature(s) Signed: 07/03/2023 5:02:30 PM By: [name] Signed: 07/12/2023 11:59:03 AM By: Yevonne Pax RN Entered By: [name] on 07/03/2023 07:43:35 -------------------------------------------------------------------------------- Vitals Details Patient Name: Date of Service: [facility], [name]. 07/03/2023 10:00 A M Medical Record Number: [id number] Patient Account Number: [id number] Date of Birth/Sex: Treating RN: [birth year] (82 y.o. [name] Primary Care Jaishon Krisher: [name] Other Clinician: Betha [name] Referring Elizabeth Haff: Treating Kolston Lacount/Extender: [name] Weeks in Treatment: 40 Vital Signs Time Taken: 10:21 Temperature (F): 98.1 Weight (lbs): 98 Pulse (bpm): 70 Respiratory Rate (breaths/min): 18 Blood Pressure (mmHg): 112/68 Reference Range: 80 - 120 mg / dl Electronic Signature(s) Signed: 07/03/2023 5:02:30 PM By: [name] Entered By: [name] on 07/03/2023 07:23:01

## 2023-07-17 ENCOUNTER — Encounter: Payer: PPO | Attending: Internal Medicine | Admitting: Internal Medicine

## 2023-07-17 DIAGNOSIS — I890 Lymphedema, not elsewhere classified: Secondary | ICD-10-CM | POA: Insufficient documentation

## 2023-07-17 DIAGNOSIS — L89320 Pressure ulcer of left buttock, unstageable: Secondary | ICD-10-CM | POA: Diagnosis not present

## 2023-07-17 DIAGNOSIS — L89620 Pressure ulcer of left heel, unstageable: Secondary | ICD-10-CM | POA: Diagnosis not present

## 2023-07-17 DIAGNOSIS — E785 Hyperlipidemia, unspecified: Secondary | ICD-10-CM | POA: Insufficient documentation

## 2023-07-17 DIAGNOSIS — E11622 Type 2 diabetes mellitus with other skin ulcer: Secondary | ICD-10-CM | POA: Diagnosis not present

## 2023-07-17 DIAGNOSIS — S81802A Unspecified open wound, left lower leg, initial encounter: Secondary | ICD-10-CM | POA: Diagnosis not present

## 2023-07-17 DIAGNOSIS — E11621 Type 2 diabetes mellitus with foot ulcer: Secondary | ICD-10-CM | POA: Diagnosis not present

## 2023-07-17 DIAGNOSIS — I5032 Chronic diastolic (congestive) heart failure: Secondary | ICD-10-CM | POA: Insufficient documentation

## 2023-07-17 DIAGNOSIS — Z7984 Long term (current) use of oral hypoglycemic drugs: Secondary | ICD-10-CM | POA: Insufficient documentation

## 2023-07-17 DIAGNOSIS — I872 Venous insufficiency (chronic) (peripheral): Secondary | ICD-10-CM | POA: Diagnosis not present

## 2023-07-17 DIAGNOSIS — L97822 Non-pressure chronic ulcer of other part of left lower leg with fat layer exposed: Secondary | ICD-10-CM | POA: Insufficient documentation

## 2023-07-17 DIAGNOSIS — L97812 Non-pressure chronic ulcer of other part of right lower leg with fat layer exposed: Secondary | ICD-10-CM | POA: Diagnosis not present

## 2023-07-17 DIAGNOSIS — L89890 Pressure ulcer of other site, unstageable: Secondary | ICD-10-CM | POA: Diagnosis not present

## 2023-07-17 DIAGNOSIS — E1140 Type 2 diabetes mellitus with diabetic neuropathy, unspecified: Secondary | ICD-10-CM | POA: Insufficient documentation

## 2023-07-17 DIAGNOSIS — E1151 Type 2 diabetes mellitus with diabetic peripheral angiopathy without gangrene: Secondary | ICD-10-CM | POA: Diagnosis not present

## 2023-07-17 DIAGNOSIS — L89220 Pressure ulcer of left hip, unstageable: Secondary | ICD-10-CM | POA: Insufficient documentation

## 2023-07-17 DIAGNOSIS — I110 Hypertensive heart disease with heart failure: Secondary | ICD-10-CM | POA: Diagnosis not present

## 2023-07-17 DIAGNOSIS — L89610 Pressure ulcer of right heel, unstageable: Secondary | ICD-10-CM | POA: Insufficient documentation

## 2023-07-17 DIAGNOSIS — L89214 Pressure ulcer of right hip, stage 4: Secondary | ICD-10-CM | POA: Diagnosis not present

## 2023-07-17 DIAGNOSIS — L89613 Pressure ulcer of right heel, stage 3: Secondary | ICD-10-CM | POA: Diagnosis not present

## 2023-07-17 DIAGNOSIS — L89894 Pressure ulcer of other site, stage 4: Secondary | ICD-10-CM | POA: Diagnosis not present

## 2023-07-18 ENCOUNTER — Ambulatory Visit: Payer: PPO | Admitting: Infectious Diseases

## 2023-07-18 NOTE — Progress Notes (Signed)
[name], [name] ([id number]) [id number] Page 1 of 16 Visit Report for 07/17/2023 Arrival Information Details Patient Name: Date of Service: [facility], [name]. 07/17/2023 11:00 A M Medical Record Number: [id number] Patient Account Number: [id number] Date of Birth/Sex: Treating RN: 05-11-1941 (82 y.o. [name] Primary Care Tashica Provencio: Barbette Reichmann Other Clinician: Betha Loa Referring Alysiah Suppa: Treating Alizeh Madril/Extender: Adolph Pollack Weeks in Treatment: 70 Visit Information History Since Last Visit All ordered tests and consults were completed: No Patient Arrived: Wheel Chair Added or deleted any medications: No Arrival Time: 11:07 Any new allergies or adverse reactions: No Transfer Assistance: EasyPivot Patient Lift Had a fall or experienced change in No Patient Identification Verified: Yes activities of daily living that may affect Secondary Verification Process Completed: Yes risk of falls: Patient Requires Transmission-Based Precautions: No Signs or symptoms of abuse/neglect since last visito No Patient Has Alerts: Yes Hospitalized since last visit: No Patient Alerts: DM II Implantable device outside of the clinic excluding No ABI R 1.30 TBI 1.12 cellular tissue based products placed in the center ABI [name] 1.27 TBI 1.21 since last visit: Has Dressing in Place as Prescribed: Yes Pain Present Now: Yes Electronic Signature(s) Signed: 07/17/2023 4:49:18 PM By: Betha Loa Entered By: Betha Loa on 07/17/2023 11:09:02 -------------------------------------------------------------------------------- Clinic Level of Care Assessment Details Patient Name: Date of Service: [facility], [name] 07/17/2023 11:00 A M Medical Record Number: [id number] Patient Account Number: [id number] Date of Birth/Sex: Treating RN: [birth year] (82 y.o. [name] Primary Care Orie Cuttino: Barbette Reichmann Other Clinician:  Betha Loa Referring Daric Koren: Treating Louis Gaw/Extender: Adolph Pollack Weeks in Treatment: 76 Clinic Level of Care Assessment Items TOOL 1 Quantity Score []  - 0 Use when EandM and Procedure is performed on INITIAL visit ASSESSMENTS - Nursing Assessment / Reassessment []  - 0 General Physical Exam (combine w/ comprehensive assessment (listed just below) when performed on new pt. evals) []  - 0 Comprehensive Assessment (HX, ROS, Risk Assessments, Wounds Hx, etc.) [name] ([id number]) [phone].pdf Page 2 of 16 ASSESSMENTS - Wound and Skin Assessment / Reassessment []  - 0 Dermatologic / Skin Assessment (not related to wound area) ASSESSMENTS - Ostomy and/or Continence Assessment and Care []  - 0 Incontinence Assessment and Management []  - 0 Ostomy Care Assessment and Management (repouching, etc.) PROCESS - Coordination of Care []  - 0 Simple Patient / Family Education for ongoing care []  - 0 Complex (extensive) Patient / Family Education for ongoing care []  - 0 Staff obtains Chiropractor, Records, T Results / Process Orders est []  - 0 Staff telephones HHA, Nursing Homes / Clarify orders / etc []  - 0 Routine Transfer to another Facility (non-emergent condition) []  - 0 Routine Hospital Admission (non-emergent condition) []  - 0 New Admissions / Manufacturing engineer / Ordering NPWT Apligraf, etc. , []  - 0 Emergency Hospital Admission (emergent condition) PROCESS - Special Needs []  - 0 Pediatric / Minor Patient Management []  - 0 Isolation Patient Management []  - 0 Hearing / Language / Visual special needs []  - 0 Assessment of Community assistance (transportation, D/C planning, etc.) []  - 0 Additional assistance / Altered mentation []  - 0 Support Surface(s) Assessment (bed, cushion, seat, etc.) INTERVENTIONS - Miscellaneous []  - 0 External ear exam []  - 0 Patient Transfer (multiple staff / Nurse, adult / Similar  devices) []  - 0 Simple Staple / Suture removal (25 or less) []  - 0 Complex Staple / Suture removal (26 or more) []  - 0 Hypo/Hyperglycemic Management (do not check if  billed separately) []  - 0 Ankle / Brachial Index (ABI) - do not check if billed separately Has the patient been seen at the hospital within the last three years: Yes Total Score: 0 Level Of Care: ____ Electronic Signature(s) Signed: 07/17/2023 4:49:18 PM By: Betha Loa Entered By: Betha Loa on 07/17/2023 11:51:18 -------------------------------------------------------------------------------- Encounter Discharge Information Details Patient Name: Date of Service: [facility], [name] 07/17/2023 11:00 A M Medical Record Number: [id number] Patient Account Number: [id number] Date of Birth/Sex: Treating RN: [birth year] (82 y.o. [name] Primary Care Lando Alcalde: Barbette Reichmann Other Clinician: Betha Loa Referring Brandey Vandalen: Treating Khalise Billard/Extender: Adolph Pollack [place], [name] ([id number]) [id number] Page 3 of 16 Weeks in Treatment: 24 Encounter Discharge Information Items Post Procedure Vitals Discharge Condition: Stable Temperature (F): 97.7 Ambulatory Status: Wheelchair Pulse (bpm): 77 Discharge Destination: Home Respiratory Rate (breaths/min): 18 Transportation: Private Auto Blood Pressure (mmHg): 107/61 Accompanied By: son Schedule Follow-up Appointment: Yes Clinical Summary of Care: Electronic Signature(s) Signed: 07/17/2023 4:49:18 PM By: Betha Loa Entered By: Betha Loa on 07/17/2023 13:54:08 -------------------------------------------------------------------------------- Lower Extremity Assessment Details Patient Name: Date of Service: [facility] [address], [name] 07/17/2023 11:00 A M Medical Record Number: [id number] Patient Account Number: [id number] Date of Birth/Sex: Treating RN: June 14, 1941 (82 y.o. [name] Primary Care  Phyliss Hulick: Barbette Reichmann Other Clinician: Betha Loa Referring Ovie Cornelio: Treating Lucienne Sawyers/Extender: Adolph Pollack Weeks in Treatment: 12 Electronic Signature(s) Signed: 07/17/2023 4:49:18 PM By: Betha Loa Signed: 07/18/2023 1:40:54 PM By: Yevonne Pax RN Entered By: Betha Loa on 07/17/2023 11:38:32 -------------------------------------------------------------------------------- Multi Wound Chart Details Patient Name: Date of Service: [facility], [name] 07/17/2023 11:00 A M Medical Record Number: [id number] Patient Account Number: [id number] Date of Birth/Sex: Treating RN: Jul 30, 1941 (82 y.o. [name] Primary Care Felma Pfefferle: Barbette Reichmann Other Clinician: Betha Loa Referring Coral Soler: Treating Julea Hutto/Extender: Adolph Pollack Weeks in Treatment: 54 Vital Signs Height(in): Pulse(bpm): 77 Weight(lbs): 98 Blood Pressure(mmHg): 107/61 Body Mass Index(BMI): Temperature(F): 97.7 Respiratory Rate(breaths/min): 18 [Mcquilkin, [name] ([id number]):Photos:] [[id number] Page 4 of 16:11 13 14] Right Trochanter Left Calcaneus Right, Distal, Medial Lower Leg Wound Location: Pressure Injury Pressure Injury Gradually Appeared Wounding Event: Pressure Ulcer Pressure Ulcer Diabetic Wound/Ulcer of the Lower Primary Etiology: Extremity Cataracts, Lymphedema, Cataracts, Lymphedema, Cataracts, Lymphedema, Comorbid History: Hypertension, Peripheral Arterial Hypertension, Peripheral Arterial Hypertension, Peripheral Arterial Disease, Peripheral Venous Disease, Disease, Peripheral Venous Disease, Disease, Peripheral Venous Disease, Type II Diabetes, Osteoarthritis, Type II Diabetes, Osteoarthritis, Type II Diabetes, Osteoarthritis, Neuropathy Neuropathy Neuropathy 10/24/2022 11/28/2022 03/10/2023 Date Acquired: 37 32 18 Weeks of Treatment: Open Open Open Wound Status: No No No Wound  Recurrence: No No Yes Clustered Wound: 2.2x3.4x1.5 1.8x1.7x0.2 8.5x3.5x0.2 Measurements [name] x W x D (cm) 5.875 2.403 23.366 A (cm) : rea 8.812 0.481 4.673 Volume (cm) : -183.40% 30.50% -325.00% % Reduction in A rea: -4157.00% -39.00% -749.60% % Reduction in Volume: 9 Position 1 (o'clock): 3.3 Maximum Distance 1 (cm): 4 Position 2 (o'clock): 2.5 Maximum Distance 2 (cm): 12 Starting Position 1 (o'clock): 12 Ending Position 1 (o'clock): 1.4 Maximum Distance 1 (cm): Yes N/A N/A Tunneling: Yes N/A N/A Undermining: Category/Stage IV Category/Stage III Grade 2 Classification: Medium Medium Large Exudate A mount: Serosanguineous Serosanguineous Serosanguineous Exudate Type: red, brown red, brown red, brown Exudate Color: Well defined, not attached Flat and Intact N/A Wound Margin: Large (67-100%) Medium (34-66%) Large (67-100%) Granulation A mount: Red Pink Red Granulation Quality: None Present (0%) Medium (34-66%) Small (1-33%) Necrotic A mount: N/A Eschar, Adherent Slough Adherent  Slough Necrotic Tissue: Fascia: Yes Fat Layer (Subcutaneous Tissue): Yes Fat Layer (Subcutaneous Tissue): Yes Exposed Structures: Fat Layer (Subcutaneous Tissue): Yes Fascia: No Fascia: No Tendon: Yes Tendon: No Tendon: No Muscle: Yes Muscle: No Muscle: No Joint: No Joint: No Joint: No Bone: No Bone: No Bone: No None None None Epithelialization: Wound Number: [id number] 6  Photos: Left, Anterior Lower Leg Left Trochanter Right Calcaneus Wound Location: Pressure Injury Pressure Injury Pressure Injury Wounding Event: Pressure Ulcer Pressure Ulcer Pressure Ulcer Primary Etiology: Cataracts, Lymphedema, Cataracts, Lymphedema, Cataracts, Lymphedema, Comorbid History: Hypertension, Peripheral Arterial Hypertension, Peripheral Arterial Hypertension, Peripheral Arterial Disease, Peripheral Venous Disease, Disease, Peripheral Venous Disease, Disease, Peripheral Venous Disease, Type  II Diabetes, Osteoarthritis, Type II Diabetes, Osteoarthritis, Type II Diabetes, Osteoarthritis, Neuropathy Neuropathy Neuropathy 04/11/2023 05/22/2023 07/23/2022 Date Acquired: 13 8 42 Weeks of Treatment: Open Open Open Wound Status: No No No Wound Recurrence: No No No Clustered Wound: 1.7x1.7x0.1 1.5x1x0.6 2.5x2x0.1 Measurements [name] x W x D (cm) 2.27 1.178 3.927 A (cm) : rea 0.227 0.707 0.393 Volume (cm) : 89.90% 63.80% 58.30% % Reduction in Area: 89.90% -117.50% 86.10% % Reduction in Volume: [name], [name] ([id number]) [id number].pdf Page 5 of 16 3 Starting Position 1 (o'clock): 9 Ending Position 1 (o'clock): 0.7 Maximum Distance 1 (cm): N/A N/A N/A Tunneling: N/A Yes N/A Undermining: Category/Stage IV Unstageable/Unclassified Category/Stage III Classification: Medium Medium Medium Exudate A mount: Serosanguineous Serosanguineous Serosanguineous Exudate Type: red, brown red, brown red, brown Exudate Color: N/A N/A Distinct, outline attached Wound Margin: Large (67-100%) N/A Medium (34-66%) Granulation A mount: Red N/A Red, Pink Granulation Quality: Small (1-33%) N/A Medium (34-66%) Necrotic A mount: Adherent Slough Eschar, Adherent Slough Eschar, Adherent Slough Necrotic Tissue: Fat Layer (Subcutaneous Tissue): Yes Fat Layer (Subcutaneous Tissue): Yes Fat Layer (Subcutaneous Tissue): Yes Exposed Structures: Tendon: Yes Fascia: No Fascia: No Fascia: No Tendon: No Tendon: No Muscle: No Muscle: No Muscle: No Joint: No Joint: No Joint: No Bone: No Bone: No Bone: No None N/A None Epithelialization: Treatment Notes Electronic Signature(s) Signed: 07/17/2023 4:49:18 PM By: Betha Loa Entered By: Betha Loa on 07/17/2023 11:38:40 -------------------------------------------------------------------------------- Multi-Disciplinary Care Plan Details Patient Name: Date of Service: [facility], [name] 07/17/2023 11:00 A  M Medical Record Number: [id number] Patient Account Number: [id number] Date of Birth/Sex: Treating RN: 09-07-1941 (82 y.o. [name] Primary Care Valla Pacey: Barbette Reichmann Other Clinician: Betha Loa Referring Charvis Lightner: Treating Loisann Roach/Extender: Adolph Pollack Weeks in Treatment: 53 Active Inactive Electronic Signature(s) Signed: 07/17/2023 4:49:18 PM By: Betha Loa Signed: 07/18/2023 1:40:54 PM By: Yevonne Pax RN Entered By: Betha Loa on 07/17/2023 11:51:37 Pain Assessment Details -------------------------------------------------------------------------------- Nicola Police ([id number]) [id number] Page 6 of 16 Patient Name: Date of Service: [facility], [name] 07/17/2023 11:00 A M Medical Record Number: [id number] Patient Account Number: [id number] Date of Birth/Sex: Treating RN: 10-May-1941 (82 y.o. [name] Primary Care Uldine Fuster: Barbette Reichmann Other Clinician: Betha Loa Referring Mehkai Gallo: Treating Esli Jernigan/Extender: Adolph Pollack Weeks in Treatment: 77 Active Problems Location of Pain Severity and Description of Pain Patient Has Paino Yes Site Locations Duration of the Pain. Constant / Intermittento Constant Rate the pain. Current Pain Level: 6 Character of Pain Describe the Pain: Aching, Burning Pain Management and Medication Current Pain Management: Medication: Yes Cold Application: No Rest: No Massage: No Activity: No T.E.N.S.: No Heat Application: No Leg drop or elevation: No Is the Current Pain Management Adequate: Inadequate How does your wound impact your activities of daily livingo Sleep: No Bathing: No  Appetite: No Relationship With Others: No Bladder Continence: No Emotions: No Bowel Continence: No Work: No Toileting: No Drive: No Dressing: No Hobbies: No Electronic Signature(s) Signed: 07/17/2023 4:49:18 PM By: Betha Loa Signed:  07/18/2023 1:40:54 PM By: Yevonne Pax RN Entered By: Betha Loa on 07/17/2023 11:13:20 -------------------------------------------------------------------------------- Patient/Caregiver Education Details Patient Name: Date of Service: [facility], [name] 9/4/2024andnbsp11:00 A M Medical Record Number: [id number] Patient Account Number: [id number] Date of Birth/Gender: Treating RN: 12/03/1940 (82 y.o. [name] Primary Care Physician: Barbette Reichmann Other Clinician: Betha Loa Referring Physician: Treating Physician/Extender: Adolph Pollack [place], [name] ([id number]) [id number] Page 7 of 16 Weeks in Treatment: 42 Education Assessment Education Provided To: Patient and Caregiver Education Topics Provided Wound/Skin Impairment: Handouts: Other: continue wound care as directed Methods: Explain/Verbal Responses: State content correctly Electronic Signature(s) Signed: 07/17/2023 4:49:18 PM By: Betha Loa Entered By: Betha Loa on 07/17/2023 12:10:55 -------------------------------------------------------------------------------- Wound Assessment Details Patient Name: Date of Service: [facility], [name] 07/17/2023 11:00 A M Medical Record Number: [id number] Patient Account Number: [id number] Date of Birth/Sex: Treating RN: [birth year] (82 y.o. [name] Primary Care Skylah Delauter: Barbette Reichmann Other Clinician: Betha Loa Referring Niley Helbig: Treating Lochlin Eppinger/Extender: Adolph Pollack Weeks in Treatment: 42 Wound Status Wound Number: 11 Primary Pressure Ulcer Etiology: Wound Location: Right Trochanter Wound Open Wounding Event: Pressure Injury Status: Date Acquired: 10/24/2022 Comorbid Cataracts, Lymphedema, Hypertension, Peripheral Arterial Disease, Weeks Of Treatment: 37 History: Peripheral Venous Disease, Type II Diabetes, Osteoarthritis, Clustered Wound: No  Neuropathy Photos Wound Measurements Length: (cm) 2.2 Width: (cm) 3.4 Depth: (cm) 1.5 Area: (cm) 5.8 Volume: (cm) 8.8 [name], [name] ([id number]) % Reduction in Area: -183.4% % Reduction in Volume: -4157% Epithelialization: None 75 Tunneling: Yes 12 Location 1 Position (o'clock): 9 Maximum Distance: (cm) 3.3 Location 2 [id number] Page 8 of 16 Position (o'clock): 4 Maximum Distance: (cm) 2.5 Undermining: Yes Starting Position (o'clock): 12 Ending Position (o'clock): 12 Maximum Distance: (cm) 1.4 Wound Description Classification: Category/Stage IV Wound Margin: Well defined, not attached Exudate Amount: Medium Exudate Type: Serosanguineous Exudate Color: red, brown Foul Odor After Cleansing: No Slough/Fibrino Yes Wound Bed Granulation Amount: Large (67-100%) Exposed Structure Granulation Quality: Red Fascia Exposed: Yes Necrotic Amount: None Present (0%) Fat Layer (Subcutaneous Tissue) Exposed: Yes Tendon Exposed: Yes Muscle Exposed: Yes Necrosis of Muscle: No Joint Exposed: No Bone Exposed: No Treatment Notes Wound #11 (Trochanter) Wound Laterality: Right Cleanser Vashe 5.8 (oz) Discharge Instruction: damp to dry daily Peri-Wound Care Topical Primary Dressing Secondary Dressing (BORDER) Zetuvit Plus SILICONE BORDER Dressing 5x5 (in/in) Discharge Instruction: Please do not put silicone bordered dressings under wraps. Use non-bordered dressing only. Secured With Compression Wrap Compression Stockings Facilities manager) Signed: 07/17/2023 4:49:18 PM By: Betha Loa Signed: 07/18/2023 1:40:54 PM By: Yevonne Pax RN Entered By: Betha Loa on 07/17/2023 11:34:12 -------------------------------------------------------------------------------- Wound Assessment Details Patient Name: Date of Service: [facility], [name] 07/17/2023 11:00 A M Medical Record Number: [id number] Patient Account Number: [id number] Date  of Birth/Sex: Treating RN: [birth year] (82 y.o. [name] Primary Care Joyann Spidle: Barbette Reichmann Other Clinician: Donnamae, Kepler ([id number]) [id number] Page 9 of 16 Referring Arwyn Besaw: Treating Akiba Melfi/Extender: Adolph Pollack Weeks in Treatment: 42 Wound Status Wound Number: 13 Primary Pressure Ulcer Etiology: Wound Location: Left Calcaneus Wound Open Wounding Event: Pressure Injury Status: Date Acquired: 11/28/2022 Comorbid Cataracts, Lymphedema, Hypertension, Peripheral Arterial Disease, Weeks Of Treatment: 32 History: Peripheral Venous Disease, Type II Diabetes, Osteoarthritis, Clustered Wound: No Neuropathy Photos  Wound Measurements Length: (cm) 1.8 Width: (cm) 1.7 Depth: (cm) 0.2 Area: (cm) 2.403 Volume: (cm) 0.481 % Reduction in Area: 30.5% % Reduction in Volume: -39% Epithelialization: None Wound Description Classification: Category/Stage III Wound Margin: Flat and Intact Exudate Amount: Medium Exudate Type: Serosanguineous Exudate Color: red, brown Foul Odor After Cleansing: No Slough/Fibrino Yes Wound Bed Granulation Amount: Medium (34-66%) Exposed Structure Granulation Quality: Pink Fascia Exposed: No Necrotic Amount: Medium (34-66%) Fat Layer (Subcutaneous Tissue) Exposed: Yes Necrotic Quality: Eschar, Adherent Slough Tendon Exposed: No Muscle Exposed: No Joint Exposed: No Bone Exposed: No Treatment Notes Wound #13 (Calcaneus) Wound Laterality: Left Cleanser Vashe 5.8 (oz) Discharge Instruction: damp to dry daily Peri-Wound Care Topical Primary Dressing Secondary Dressing ABD Pad 5x9 (in/in) Discharge Instruction: Cover with ABD pad Kerlix 4.5 x 4.1 (in/yd) Discharge Instruction: Apply Kerlix 4.5 x 4.1 (in/yd) as instructed Secured With ACE WRAP - 67M ACE Elastic Bandage With VELCRO Brand Closure, 4 (in) Compression Wrap Compression Stockings [name], [name]  ([id number]) [id number].pdf Page 10 of 16 Add-Ons Electronic Signature(s) Signed: 07/17/2023 4:49:18 PM By: Betha Loa Signed: 07/18/2023 1:40:54 PM By: Yevonne Pax RN Entered By: Betha Loa on 07/17/2023 11:35:03 -------------------------------------------------------------------------------- Wound Assessment Details Patient Name: Date of Service: [name], [name] 07/17/2023 11:00 A M Medical Record Number: [id number] Patient Account Number: [id number] Date of Birth/Sex: Treating RN: 1941-03-23 (82 y.o. [name] Primary Care Eleno Weimar: Barbette Reichmann Other Clinician: Betha Loa Referring Riti Rollyson: Treating Brionne Mertz/Extender: Adolph Pollack Weeks in Treatment: 42 Wound Status Wound Number: 14 Primary Diabetic Wound/Ulcer of the Lower Extremity Etiology: Wound Location: Right, Distal, Medial Lower Leg Wound Open Wounding Event: Gradually Appeared Status: Date Acquired: 03/10/2023 Comorbid Cataracts, Lymphedema, Hypertension, Peripheral Arterial Disease, Weeks Of Treatment: 18 History: Peripheral Venous Disease, Type II Diabetes, Osteoarthritis, Clustered Wound: Yes Neuropathy Photos Wound Measurements Length: (cm) 8.5 Width: (cm) 3.5 Depth: (cm) 0.2 Area: (cm) 23.366 Volume: (cm) 4.673 % Reduction in Area: -325% % Reduction in Volume: -749.6% Epithelialization: None Wound Description Classification: Grade 2 Exudate Amount: Large Exudate Type: Serosanguineous Exudate Color: red, brown Foul Odor After Cleansing: No Slough/Fibrino Yes Wound Bed Granulation Amount: Large (67-100%) Exposed Structure Granulation Quality: Red Fascia Exposed: No Necrotic Amount: Small (1-33%) Fat Layer (Subcutaneous Tissue) Exposed: Yes Necrotic Quality: Adherent Slough Tendon Exposed: No Muscle Exposed: No Joint Exposed: No Bone Exposed: No [name], [name] ([id number]) [id number] Page 11 of  16 Treatment Notes Wound #14 (Lower Leg) Wound Laterality: Right, Medial, Distal Cleanser Vashe 5.8 (oz) Discharge Instruction: damp to dry daily Peri-Wound Care Topical Primary Dressing Secondary Dressing ABD Pad 5x9 (in/in) Discharge Instruction: Cover with ABD pad Kerlix 4.5 x 4.1 (in/yd) Discharge Instruction: Apply Kerlix 4.5 x 4.1 (in/yd) as instructed Secured With ACE WRAP - 67M ACE Elastic Bandage With VELCRO Brand Closure, 4 (in) Compression Wrap Compression Stockings Add-Ons Electronic Signature(s) Signed: 07/17/2023 4:49:18 PM By: Betha Loa Signed: 07/18/2023 1:40:54 PM By: Yevonne Pax RN Entered By: Betha Loa on 07/17/2023 11:35:51 -------------------------------------------------------------------------------- Wound Assessment Details Patient Name: Date of Service: [facility], [name] 07/17/2023 11:00 A M Medical Record Number: [id number] Patient Account Number: [id number] Date of Birth/Sex: Treating RN: 09-02-1941 (82 y.o. [name] Primary Care Blessyn [place]: Barbette Reichmann Other Clinician: Betha Loa Referring Hanan Moen: Treating Kayleana Waites/Extender: Adolph Pollack Weeks in Treatment: 42 Wound Status Wound Number: 15 Primary Pressure Ulcer Etiology: Wound Location: Left, Anterior Lower Leg Wound Open Wounding Event: Pressure Injury Status: Date Acquired: 04/11/2023 Comorbid Cataracts, Lymphedema, Hypertension, Peripheral Arterial  Disease, Weeks Of Treatment: 13 History: Peripheral Venous Disease, Type II Diabetes, Osteoarthritis, Clustered Wound: No Neuropathy Photos JAZELLE, FAZIO [name] ([id number]) [id number] Page 12 of 16 Wound Measurements Length: (cm) 1.7 Width: (cm) 1.7 Depth: (cm) 0.1 Area: (cm) 2.27 Volume: (cm) 0.227 % Reduction in Area: 89.9% % Reduction in Volume: 89.9% Epithelialization: None Wound Description Classification: Category/Stage IV Exudate Amount: Medium Exudate  Type: Serosanguineous Exudate Color: red, brown Foul Odor After Cleansing: No Slough/Fibrino Yes Wound Bed Granulation Amount: Large (67-100%) Exposed Structure Granulation Quality: Red Fascia Exposed: No Necrotic Amount: Small (1-33%) Fat Layer (Subcutaneous Tissue) Exposed: Yes Necrotic Quality: Adherent Slough Tendon Exposed: Yes Muscle Exposed: No Joint Exposed: No Bone Exposed: No Treatment Notes Wound #15 (Lower Leg) Wound Laterality: Left, Anterior Cleanser Vashe 5.8 (oz) Discharge Instruction: damp to dry daily Peri-Wound Care Topical Primary Dressing Secondary Dressing ABD Pad 5x9 (in/in) Discharge Instruction: Cover with ABD pad Kerlix 4.5 x 4.1 (in/yd) Discharge Instruction: Apply Kerlix 4.5 x 4.1 (in/yd) as instructed Secured With ACE WRAP - 16M ACE Elastic Bandage With VELCRO Brand Closure, 4 (in) Compression Wrap Compression Stockings Add-Ons Electronic Signature(s) Signed: 07/17/2023 4:49:18 PM By: Betha Loa Signed: 07/18/2023 1:40:54 PM By: Yevonne Pax RN Entered By: Betha Loa on 07/17/2023 11:36:31 Hitt, [name] ([id number]) [id number] Page 13 of 16 -------------------------------------------------------------------------------- Wound Assessment Details Patient Name: Date of Service: [facility], [name] 07/17/2023 11:00 A M Medical Record Number: [id number] Patient Account Number: [id number] Date of Birth/Sex: Treating RN: Sep 19, 1941 (82 y.o. [name] Primary Care Barlow Harrison: Barbette Reichmann Other Clinician: Betha Loa Referring Bertha Lokken: Treating Traylen Eckels/Extender: Adolph Pollack Weeks in Treatment: 42 Wound Status Wound Number: 16 Primary Pressure Ulcer Etiology: Wound Location: Left Trochanter Wound Open Wounding Event: Pressure Injury Status: Date Acquired: 05/22/2023 Comorbid Cataracts, Lymphedema, Hypertension, Peripheral Arterial Disease, Weeks Of Treatment:  8 History: Peripheral Venous Disease, Type II Diabetes, Osteoarthritis, Clustered Wound: No Neuropathy Photos Wound Measurements Length: (cm) 1.5 Width: (cm) 1 Depth: (cm) 0.6 Area: (cm) 1.178 Volume: (cm) 0.707 % Reduction in Area: 63.8% % Reduction in Volume: -117.5% Undermining: Yes Starting Position (o'clock): 3 Ending Position (o'clock): 9 Maximum Distance: (cm) 0.7 Wound Description Classification: Unstageable/Unclassified Exudate Amount: Medium Exudate Type: Serosanguineous Exudate Color: red, brown Foul Odor After Cleansing: No Slough/Fibrino Yes Wound Bed Necrotic Amount: Large (67-100%) Exposed Structure Necrotic Quality: Eschar, Adherent Slough Fascia Exposed: No Fat Layer (Subcutaneous Tissue) Exposed: Yes Tendon Exposed: No Muscle Exposed: No Joint Exposed: No Bone Exposed: No Treatment Notes Wound #16 (Trochanter) Wound Laterality: Left Cleanser Normal Saline Discharge Instruction: Wash your hands with soap and water. Remove old dressing, discard into plastic bag and place into trash. Cleanse the [name], RAMSELL [name] ([id number]) [id number] Page 14 of 16 wound with Normal Saline prior to applying a clean dressing using gauze sponges, not tissues or cotton balls. Do not scrub or use excessive force. Pat dry using gauze sponges, not tissue or cotton balls. Peri-Wound Care Topical Santyl Collagenase Ointment, 30 (gm), tube Discharge Instruction: apply nickel thick to wound bed only Primary Dressing Gauze Discharge Instruction: Vashe damp to dry over Santyl Secondary Dressing (BORDER) Zetuvit Plus SILICONE BORDER Dressing 4x4 (in/in) Discharge Instruction: Please do not put silicone bordered dressings under wraps. Use non-bordered dressing only. Secured With Compression Wrap Compression Stockings Facilities manager) Signed: 07/17/2023 4:49:18 PM By: Betha Loa Signed: 07/18/2023 1:40:54 PM By: Yevonne Pax  RN Entered By: Betha Loa on 07/17/2023 11:37:36 -------------------------------------------------------------------------------- Wound Assessment Details Patient Name: Date of Service: [facility],  [name] 07/17/2023 11:00 A M Medical Record Number: [id number] Patient Account Number: [id number] Date of Birth/Sex: Treating RN: 10/16/1941 (82 y.o. [name] Primary Care Zadyn Yardley: Barbette Reichmann Other Clinician: Betha Loa Referring Ladaisha Portillo: Treating Kanai Berrios/Extender: Adolph Pollack Weeks in Treatment: 42 Wound Status Wound Number: 6 Primary Pressure Ulcer Etiology: Wound Location: Right Calcaneus Wound Open Wounding Event: Pressure Injury Status: Date Acquired: 07/23/2022 Comorbid Cataracts, Lymphedema, Hypertension, Peripheral Arterial Disease, Weeks Of Treatment: 42 History: Peripheral Venous Disease, Type II Diabetes, Osteoarthritis, Clustered Wound: No Neuropathy Photos Wound Measurements [name], [name] ([id number]) Length: (cm) 2.5 Width: (cm) 2 Depth: (cm) 0.1 Area: (cm) 3.927 Volume: (cm) 0.393 [id number] Page 15 of 16 % Reduction in Area: 58.3% % Reduction in Volume: 86.1% Epithelialization: None Wound Description Classification: Category/Stage III Wound Margin: Distinct, outline attached Exudate Amount: Medium Exudate Type: Serosanguineous Exudate Color: red, brown Foul Odor After Cleansing: No Slough/Fibrino Yes Wound Bed Granulation Amount: Medium (34-66%) Exposed Structure Granulation Quality: Red, Pink Fascia Exposed: No Necrotic Amount: Medium (34-66%) Fat Layer (Subcutaneous Tissue) Exposed: Yes Necrotic Quality: Eschar, Adherent Slough Tendon Exposed: No Muscle Exposed: No Joint Exposed: No Bone Exposed: No Treatment Notes Wound #6 (Calcaneus) Wound Laterality: Right Cleanser Vashe 5.8 (oz) Discharge Instruction: damp to dry daily over Santyl Peri-Wound  Care Topical Primary Dressing Secondary Dressing ABD Pad 5x9 (in/in) Discharge Instruction: Cover with ABD pad Kerlix 4.5 x 4.1 (in/yd) Discharge Instruction: Apply Kerlix 4.5 x 4.1 (in/yd) as instructed Secured With ACE WRAP - 69M ACE Elastic Bandage With VELCRO Brand Closure, 4 (in) Compression Wrap Compression Stockings Add-Ons Electronic Signature(s) Signed: 07/17/2023 4:49:18 PM By: Betha Loa Signed: 07/18/2023 1:40:54 PM By: Yevonne Pax RN Entered By: Betha Loa on 07/17/2023 11:38:18 -------------------------------------------------------------------------------- Vitals Details Patient Name: Date of Service: [facility], [name] 07/17/2023 11:00 A M Medical Record Number: [id number] Patient Account Number: [id number] Date of Birth/Sex: Treating RN: [birth year] (82 y.o. [name] Primary Care Arul Farabee: Barbette Reichmann Other Clinician: Betha Loa Referring Dnyla Antonetti: Treating Jahdai Padovano/Extender: Adolph Pollack Mar[place], [name] ([id number]) [id number] Page 16 of 16 Weeks in Treatment: 42 Vital Signs Time Taken: 11:09 Temperature (F): 97.7 Weight (lbs): 98 Pulse (bpm): 77 Respiratory Rate (breaths/min): 18 Blood Pressure (mmHg): 107/61 Reference Range: 80 - 120 mg / dl Electronic Signature(s) Signed: 07/17/2023 4:49:18 PM By: Betha Loa Entered By: Betha Loa on 07/17/2023 11:13:04

## 2023-07-18 NOTE — Progress Notes (Signed)
[name], [name] ([id number]) [id number] Page 1 of 15 Visit Report for 07/17/2023 Chief Complaint Document Details Patient Name: Date of Service: [facility], [name]. 07/17/2023 11:00 A M Medical Record Number: [id number] Patient Account Number: [id number] Date of Birth/Sex: Treating RN: September 23, 1941 (82 y.o. [name] Primary Care Provider: Barbette Reichmann Other Clinician: Betha Loa Referring Provider: Treating Provider/Extender: Adolph Pollack Weeks in Treatment: 56 Information Obtained from: Patient Chief Complaint Right heel wound, right hip wound, right ankle wound, left ankle wound, right lower extremity wound, left anterior lower extremity wound, left hip wound Electronic Signature(s) Signed: 07/17/2023 12:24:25 PM By: Geralyn Corwin DO Entered By: Geralyn Corwin on 07/17/2023 12:10:22 -------------------------------------------------------------------------------- Debridement Details Patient Name: Date of Service: [facility], [name]. 07/17/2023 11:00 A M Medical Record Number: [id number] Patient Account Number: [id number] Date of Birth/Sex: Treating RN: December 25, 1940 (82 y.o. [name] Primary Care Provider: Barbette Reichmann Other Clinician: Betha Loa Referring Provider: Treating Provider/Extender: Adolph Pollack Weeks in Treatment: 42 Debridement Performed for Assessment: Wound #13 Left Calcaneus Performed By: Physician Geralyn Corwin, MD Debridement Type: Debridement Level of Consciousness (Pre-procedure): Awake and Alert Pre-procedure Verification/Time Out Yes - 11:46 Taken: Start Time: 11:46 Percent of Wound Bed Debrided: 100% T Area Debrided (cm): otal 2.4 Tissue and other material debrided: Viable, Non-Viable, Slough, Slough Level: Non-Viable Tissue Debridement Description: Selective/Open Wound Instrument: Curette Bleeding: Minimum Hemostasis Achieved:  Pressure Response to Treatment: Procedure was tolerated well Level of Consciousness (Post- Awake and Alert procedure): [name], [name] ([id number]) [id number] Page 2 of 15 Post Debridement Measurements of Total Wound Length: (cm) 1.8 Stage: Category/Stage III Width: (cm) 1.7 Depth: (cm) 0.2 Volume: (cm) 0.481 Character of Wound/Ulcer Post Debridement: Stable Post Procedure Diagnosis Same as Pre-procedure Electronic Signature(s) Signed: 07/17/2023 12:24:25 PM By: Geralyn Corwin DO Signed: 07/17/2023 4:49:18 PM By: Betha Loa Signed: 07/18/2023 1:40:54 PM By: Yevonne Pax RN Entered By: Betha Loa on 07/17/2023 11:46:59 -------------------------------------------------------------------------------- Debridement Details Patient Name: Date of Service: [facility], [name]. 07/17/2023 11:00 A M Medical Record Number: [id number] Patient Account Number: [id number] Date of Birth/Sex: Treating RN: 11-26-1940 (82 y.o. [name] Primary Care Provider: Barbette Reichmann Other Clinician: Betha Loa Referring Provider: Treating Provider/Extender: Adolph Pollack Weeks in Treatment: 42 Debridement Performed for Assessment: Wound #6 Right Calcaneus Performed By: Physician Geralyn Corwin, MD Debridement Type: Debridement Level of Consciousness (Pre-procedure): Awake and Alert Pre-procedure Verification/Time Out Yes - 11:47 Taken: Start Time: 11:47 Percent of Wound Bed Debrided: 100% T Area Debrided (cm): otal 3.92 Tissue and other material debrided: Viable, Non-Viable, Slough, Skin: Dermis , Skin: Epidermis, Slough Level: Skin/Epidermis Debridement Description: Selective/Open Wound Instrument: Curette Bleeding: Minimum Hemostasis Achieved: Pressure Response to Treatment: Procedure was tolerated well Level of Consciousness (Post- Awake and Alert procedure): Post Debridement Measurements of Total Wound Length: (cm)  2.5 Stage: Category/Stage III Width: (cm) 2 Depth: (cm) 0.1 Volume: (cm) 0.393 Character of Wound/Ulcer Post Debridement: Stable Post Procedure Diagnosis Same as Pre-procedure Electronic Signature(s) Signed: 07/17/2023 12:24:25 PM By: Geralyn Corwin DO Signed: 07/17/2023 4:49:18 PM By: Betha Loa Signed: 07/18/2023 1:40:54 PM By: Yevonne Pax RN Entered By: Betha Loa on 07/17/2023 11:47:37 [name], [name] ([id number]) [id number] Page 3 of 15 -------------------------------------------------------------------------------- Debridement Details Patient Name: Date of Service: [facility], [place], [name]. 07/17/2023 11:00 A M Medical Record Number: [id number] Patient Account Number: [id number] Date of Birth/Sex: Treating RN: 12/24/1940 (82 y.o. [name] Primary Care Provider: Barbette Reichmann Other Clinician: Betha Loa Referring  Provider: Treating Provider/Extender: Adolph Pollack Weeks in Treatment: 42 Debridement Performed for Assessment: Wound #16 Left Trochanter Performed By: Physician Geralyn Corwin, MD Debridement Type: Debridement Level of Consciousness (Pre-procedure): Awake and Alert Pre-procedure Verification/Time Out Yes - 11:49 Taken: Start Time: 11:49 Percent of Wound Bed Debrided: 100% T Area Debrided (cm): otal 1.18 Tissue and other material debrided: Viable, Non-Viable, Slough, Slough Level: Non-Viable Tissue Debridement Description: Selective/Open Wound Instrument: Forceps, Scissors Bleeding: Minimum Hemostasis Achieved: Pressure Response to Treatment: Procedure was tolerated well Level of Consciousness (Post- Awake and Alert procedure): Post Debridement Measurements of Total Wound Length: (cm) 1.5 Stage: Unstageable/Unclassified Width: (cm) 1 Depth: (cm) 0.5 Volume: (cm) 0.589 Character of Wound/Ulcer Post Debridement: Stable Post Procedure Diagnosis Same as Pre-procedure Electronic  Signature(s) Signed: 07/17/2023 12:24:25 PM By: Geralyn Corwin DO Signed: 07/17/2023 4:49:18 PM By: Betha Loa Signed: 07/18/2023 1:40:54 PM By: Yevonne Pax RN Entered By: Betha Loa on 07/17/2023 11:50:50 -------------------------------------------------------------------------------- HPI Details Patient Name: Date of Service: [facility], [name]. 07/17/2023 11:00 A M Medical Record Number: [id number] Patient Account Number: [id number] Date of Birth/Sex: Treating RN: Jan 12, 1941 (82 y.o. [name], [name], [name] ([id number]) [id number] Page 4 of 15 Primary Care Provider: Barbette Reichmann Other Clinician: Betha Loa Referring Provider: Treating Provider/Extender: Adolph Pollack Weeks in Treatment: 79 History of Present Illness HPI Description: 82 year old patient who comes with a referral for bilateral lower extremity edema and a lower extremity ulceration and has been sent by her PCP [name]. I understand the patient was recently put on amoxicillin and doxycycline but could not tolerate the amoxicillin. doxycycline course was completed. a BNP and EKG was supposed to be normal and the patient did not have any dyspnea. the patient has been on a diuretic. The patient was also prescribed a pair of elastic compression stockings of the 20-30 mmHg pressure variety. x-ray of the right ankle was done on 09/20/2015 and showed posttraumatic and postsurgical changes of the right ankle with secondary degenerative changes of the tibiotalar joint and to a lesser degree the subtalar joint. No definite acute bony abnormalities are noted. Past medical history significant for diabetes mellitus, hypertension, hyperlipidemia, right breast cancer treated with a mastectomy in 2014. She has never smoked. 10/24/2015 -- she had delayed her vascular test because of her husband surgery but she is now ready to get him taken care of. He is also  unable to use compression stockings and hence we will need to order her Juzo wraps. 10/31/2015-- was seen by Dr. Wyn Quaker on 10/28/2015. She had a left lower extremity arterial duplex done at his office a couple of years ago and that was essentially normal. Today they performed a venous duplex which revealed no evidence of deep vein thrombosis, superficial thrombophlebitis, no venous reflex was seen on the right and a minimal amount of reflux was seen on the left great saphenous vein but no significant reflux was seen. Impression was that there was a component of lymphedema present from a previous surgery and he would recommend compression stockings and leg elevation. Readmission: 07-24-2022 upon evaluation today patient presents for initial inspection here in our clinic concerning issues that she has been having with her legs this is actually been going on for several years according to what her family member with her today tells me as well as what the patient reiterates as well. She is currently most recently been seeing Dr. Ether Griffins and subsequently he had her in [place] boots. However 2 weeks ago  he referred her to [place] and then subsequently took her out of the [place] boot wraps at that point. At this time the left leg looks to be worse in the right leg currently. She is on Lasix and lisinopril with hydrochlorothiazide she has high blood pressure she also has issues currently with lower extremity swelling and edema which has been an ongoing issue for her as well. Patient does have a history of chronic venous hypertension, lymphedema, diabetes mellitus type 2, hypertension, peripheral vascular disease, and neuropathy. Currently she is on Lasix as well as lisinopril with hydrochlorothiazide. 08-02-2022 upon evaluation today patient presents for reevaluation the good news is she is actually doing somewhat better in regard to the wound and the overall appearance and sinuses. The unfortunate thing is her infection  really is not significantly improved we did have to switch out her antibiotic once we got that final result back and I switched her to Levaquin and away from the doxycycline. Unfortunately the doxycycline had been doing poorly for her. In fact she had had diarrhea from the time she started taking it on Friday and she is still having it when she shows up today for evaluation. Again I was not aware of this and obviously she does appear to be somewhat dehydrated as well based on what I see. My concern which I discussed with the patient today is the possibility of a C. difficile infection. With that being said fact this started immediately upon taking the doxycycline makes me think that it was just the medicine and is not completely out of her system yet despite having taken the last dose Tuesday morning. Nonetheless with what we are seeing currently I want to be sure that reason I Minna contact her primary care provider and see if a would be willing to see her and test for C. difficile infection. 08-07-2022 upon evaluation today patient appears to be doing well currently in regard to her wounds which are actually measuring much better this is great news. Fortunately I do not see any signs of active infection locally or systemically at this time which is great as well. The good news is she was tested for a C. difficile infection and it was negative. I am very pleased and thankful for primary care provider for doing that so this means that she was just having a severe reaction to the doxycycline we have added that to her allergy list at this point. 08-14-2022 upon evaluation today patient appears to be doing well currently in regard to her dehydration she is actually significantly improved compared to last time I saw her last week. With that being said I do not see any evidence of active infection systemically at this time which is great news. However locally she still does appear to have cellulitis in left  lower extremity. I discussed with her today that I do believe she would benefit from going ahead and starting the Levaquin. Again we will concerned about the C. difficile infection of the diarrhea but that this turned out to be just an issue with a reaction to the doxycycline. My hope is that the Levaquin will not cause her any complication and will be able to treat the infection. I did review her arterial study as well which was dated on 07-31-2022. It showed that she had a ABI on the right of 1.30 and on the left of 1.27 with a TBI on the right of 1.12 and the left 1.21 this is a normal arterial study.  Again on the patient's wound culture she actually did show evidence of Proteus, Morganella, and MRSA. Levaquin is a good option here across the board. 09/26/2022 Ms. Diane Coffelt is a 82 year old female with a past medical history of type 2 diabetes currently controlled on oral agents, venous insufficiency/lymphedema, right breast cancer and chronic diastolic heart failure that presents to the clinic for a 1 month history of nonhealing wounds to the left lower extremity, right heel and left buttocks. She states that the buttocks wound and the right heel wound developed while she was in the hospital. She was admitted on 08/22/2022 for severe sepsis secondary to acute sigmoid and distal colonic diverticulitis. At that time it was noted she had a stage I decubitus ulcer to her bilateral buttocks. A wound to the heel was not mentioned. She currently denies systemic signs of infection. She came into clinic in a blue gown with no undergarments. She has not been dressing the wounds. She states she just recently obtained home health and they are coming out for the first time this week. She is seen in our clinic often for lower extremity wounds secondary to venous insufficiency. 11/22; patient presents for follow-up. Son is present during the encounter. Per son it sounds like they are not doing any dressing  changes. She states she has home health but they have not come out. She is going to let us know which home health agency she has been approved for so we can send orders. Currently she denies signs of infection. 12/13; patient presents for follow-up. Patient has home health and they are coming out once a week. It appears that there has not been any dressing changes except for with home health. Patient has a newly discovered wound to the left foot. There is exposed bone. There is slight erythema and increased warmth to the surrounding tissue. Patient is completely unaware of this wound. 12/20; patient presents for follow-up. She has been taking her oral antibiotics. She now has an eschar and wound to the right hip. She has been using Dakin's wet-to-dry dressings to the left foot wound and buttocks wound. She has been using Medihoney and silver alginate to the right heel wound. She currently denies systemic signs of infection. She is mainly bedbound or in a wheelchair. 1/10; patient presents for follow-up. Patient had her left second toenail removed by Dr. Ether Griffins, podiatry on 12/21. Unfortunately she did not feel well and she was admitted to the hospital for sepsis. Her right heel wound was thought to be infected and this was debrided in the OR by Dr. Ether Griffins. Culture and bone biopsy had no growth. She has been using Medihoney to all the wound beds except for the lefty buttocks wound for which she uses Dakin's wet-to-dry dressings. She has developed a pressure injury to the left heel now. This is despite having Prevalon boots. Although she is not wearing them today. She has completed 4 weeks of Augmentin and also a week of doxycycline for osteomyelitis of the left foot. Her left foot wound no longer probes to bone. Currently she denies signs of infection. 1/24; patient presents for follow-up. She has been using Medihoney and Hydrofera Blue to the wound beds except for this buttocks wound she has been  using Dakin's wet-to-dry dressings. She has developed 2 new wounds 1 to the left heel and the other to the right medial ankle. She claims she is wearing her Prevalon boots all the time although she does not have them on today. She  currently denies signs of infection. ZAYIAH, ZAJAC ([id number]) [id number] Page 5 of 15 2/21; patient presents for follow-up. She was recently hospitalized on 12/19/2022 for sepsis secondary to Enterococcus bacillus bacteremia. She was treated with IV antibiotics and has completed her discharge oral antibiotics. She had a CT scanning of the abdomen/pelvis without acute abnormalities. Husband is present with patient today. They have been using Dakin's wet-to-dry dressings to the sacral wound and hip wound and Hydrofera Blue and Medihoney to the feet wounds. They are not changing the dressings daily. It is unclear how often they actually change the dressings. She has been wearing her Prevalon boots at night but not during the day. She currently denies systemic signs of infection. 3/20; patient presents for follow-up. She again was in the hospital for severe sepsis on 2/24; MRIs at that time showed findings suspicious for osteomyelitis to the right hip. She is currently on Augmentin to complete 4 weeks of this. She is following with ID. She has been using Dakin's wet-to-dry dressings to the sacrum and right hip however the solution is starting to cause discomfort. T the rest of the wound she has been using Hydrofera Blue and Medihoney. Patient o and son are not interested in doing palliative care or hospice. They state that they had discussions while in the hospital. 4/17; patient presents for follow-up. She has been using Vashe wet-to-dry dressings to the sacrum and right trochanter wound. She has been using Medihoney and Hydrofera Blue to the bilateral feet wounds. She has no issues or complaints today. She denies signs of infection. 5/1;  patient developed a new wound to the right leg. Son was present and states that there was a scab and he removed it creating a wound. He has been keeping the area covered. T the sacral and right trochanter wound they have been using Vashe wet-to-dry dressings and Medihoney and Hydrofera Blue to the o bilateral feet wounds. She denies signs of infection. 6/1; 1 month follow-up. She has a new area on the left anterior lower leg. In the meantime the sacral ulcer is closed. She still has an extensive wound on the right hip with exposed tendon over the greater trochanter. On the lower extremities the left anterior lower leg wound is new right ankle left heel and right heel all look reasonably stable. We are using Vashe wet-to-dry on the hip Medihoney and Hydrofera Blue on the lower extremities. She is accompanied by her son I think who is doing the dressings. They do not have a pressure-relief surface at home but they do have a hospital bed 7/10; I have not seen this patient since 03/13/2023. She was scheduled to be seen on 5/29 however missed her appointment due to illness. She was seen on 6/5 by Dr. Leanord Hawking and she presents today for her follow-up. Unfortunately her wounds have declined since I last saw her. She has several new wounds including one to the left hip wound and one left anterior lower leg. The right hip wound has declined and has maggots with bone exposed. She has been using Vashe wet- to-dry dressings to Right hip wound and Medihoney and Hydrofera Blue to the The remaining wounds. Sacral wound remains closed. 7/24; patient presents for follow-up. At last clinic visit I recommended she go to the ED for potential admission, IV antibiotics and further imaging. She had MRI of the pelvis and left foot that showed osteomyelitis. Podiatry advised conservative management. She was also seen by infectious disease who discharged patient on 2  weeks of linezolid and 2 weeks of ciprofloxacin. Patient is at  home and has home health. Palliative care was consulted during the stay and she is full code and has declined further outpatient palliative care and hospice services.. She currently denies systemic signs of infection. Overall there is improvement in the appearance of the wounds. There is healthier granulation tissue present. However this is overall poor prognosis case. She has Prevalon boots but it is unclear if she is using these. She does not have them on today. She follows up with infectious disease next week. 8/7; Patient's been using Vashe wet-to-dry dressing to all the wound beds except for the left hip she has been using Santyl. She followed up with infectious disease on 8/1. Currently she is on linezolid and ciprofloxacin. They recommended lab work to see if she needs extension of her oral antibiotics. She currently denies systemic signs of infection. 8/21; patient presents for follow-up. She has been using Vashe wet-to-dry dressings to all wounds except the left hip she has been using Santyl. All wounds have improved in size and appearance. She has healthy granulation tissue present to all wounds except the left hip still has nonviable tissue throughout. She states she has completed her oral antibiotics per ID. She currently denies systemic signs of infection. 9/4; patient presents for follow-up. She has been using Vashe wet-to-dry dressings to all wounds except the left hip. T the left hip she has been using Santyl. o All wounds appear well-healing. She currently denies systemic signs of infection. Electronic Signature(s) Signed: 07/17/2023 12:24:25 PM By: Geralyn Corwin DO Entered By: Geralyn Corwin on 07/17/2023 12:11:02 -------------------------------------------------------------------------------- Physical Exam Details Patient Name: Date of Service: [facility], [name]. 07/17/2023 11:00 A M Medical Record Number: [id number] Patient Account Number: [id number] Date of Birth/Sex:  Treating RN: [birth year] (82 y.o. [name] Primary Care Provider: Barbette Reichmann Other Clinician: Betha Loa Referring Provider: Treating Provider/Extender: Adolph Pollack Weeks in Treatment: 26 Constitutional . Cardiovascular . Psychiatric STARLITE, BADEN ([id number]) [id number] Page 6 of 15 . Notes T the lower extremities and feet there are open wounds with granulation tissue and nonviable tissue. T the right hip there is an open wound with granulation o o tissue. There is probing to bone on exam. T the left trochanter there is an open wound with nonviable tissue throughout. No signs of surrounding soft tissue o infection to any of the wound beds. Electronic Signature(s) Signed: 07/17/2023 12:24:25 PM By: Geralyn Corwin DO Entered By: Geralyn Corwin on 07/17/2023 12:12:18 -------------------------------------------------------------------------------- Physician Orders Details Patient Name: Date of Service: [facility], [name]. 07/17/2023 11:00 A M Medical Record Number: [id number] Patient Account Number: [id number] Date of Birth/Sex: Treating RN: Oct 29, 1941 (82 y.o. [name] Primary Care Provider: Barbette Reichmann Other Clinician: Betha Loa Referring Provider: Treating Provider/Extender: Adolph Pollack Weeks in Treatment: 15 Verbal / Phone Orders: Yes Clinician: Yevonne Pax Read Back and Verified: Yes Diagnosis Coding Follow-up Appointments Return Appointment in 2 weeks. Home Health [facility] for wound care. May utilize formulary equivalent dressing for wound treatment orders unless otherwise specified. Home Health Nurse may visit PRN to address patients wound care needs. - left trochanter, right heel- santyl daily all other wounds- vashe damp tp dry daily Please follow up on group 2 low air loss mattress Off-Loading Low air-loss mattress (Group 2) Wound  Treatment Wound #11 - Trochanter Wound Laterality: Right Cleanser: Vashe 5.8 (oz) (Home Health) 1 x Per Day/30 Days Discharge  Instructions: damp to dry daily Secondary Dressing: (BORDER) Zetuvit Plus SILICONE BORDER Dressing 5x5 (in/in) 1 x Per Day/30 Days Discharge Instructions: Please do not put silicone bordered dressings under wraps. Use non-bordered dressing only. Wound #13 - Calcaneus Wound Laterality: Left Cleanser: Vashe 5.8 (oz) (Home Health) 1 x Per Day/30 Days Discharge Instructions: damp to dry daily Secondary Dressing: ABD Pad 5x9 (in/in) 1 x Per Day/30 Days Discharge Instructions: Cover with ABD pad Secondary Dressing: Kerlix 4.5 x 4.1 (in/yd) 1 x Per Day/30 Days Discharge Instructions: Apply Kerlix 4.5 x 4.1 (in/yd) as instructed Secured With: ACE WRAP - 54M ACE Elastic Bandage With VELCRO Brand Closure, 4 (in) 1 x Per Day/30 Days Wound #14 - Lower Leg Wound Laterality: Right, Medial, Distal Cleanser: Vashe 5.8 (oz) (Home Health) 1 x Per Day/30 Days Discharge Instructions: damp to dry daily Secondary Dressing: ABD Pad 5x9 (in/in) 1 x Per Day/30 Days Discharge Instructions: Cover with ABD pad [name], [name] ([id number]) [id number] Page 7 of 15 Secondary Dressing: Kerlix 4.5 x 4.1 (in/yd) 1 x Per Day/30 Days Discharge Instructions: Apply Kerlix 4.5 x 4.1 (in/yd) as instructed Secured With: ACE WRAP - 54M ACE Elastic Bandage With VELCRO Brand Closure, 4 (in) 1 x Per Day/30 Days Wound #15 - Lower Leg Wound Laterality: Left, Anterior Cleanser: Vashe 5.8 (oz) (Home Health) 1 x Per Day/30 Days Discharge Instructions: damp to dry daily Secondary Dressing: ABD Pad 5x9 (in/in) 1 x Per Day/30 Days Discharge Instructions: Cover with ABD pad Secondary Dressing: Kerlix 4.5 x 4.1 (in/yd) 1 x Per Day/30 Days Discharge Instructions: Apply Kerlix 4.5 x 4.1 (in/yd) as instructed Secured With: ACE WRAP - 54M ACE Elastic Bandage With VELCRO Brand Closure, 4 (in)  1 x Per Day/30 Days Wound #16 - Trochanter Wound Laterality: Left Cleanser: Normal Saline (Home Health) 1 x Per Day/30 Days Discharge Instructions: Wash your hands with soap and water. Remove old dressing, discard into plastic bag and place into trash. Cleanse the wound with Normal Saline prior to applying a clean dressing using gauze sponges, not tissues or cotton balls. Do not scrub or use excessive force. Pat dry using gauze sponges, not tissue or cotton balls. Topical: Santyl Collagenase Ointment, 30 (gm), tube (Home Health) 1 x Per Day/30 Days Discharge Instructions: apply nickel thick to wound bed only Prim Dressing: Gauze 1 x Per Day/30 Days ary Discharge Instructions: Vashe damp to dry over Santyl Secondary Dressing: (BORDER) Zetuvit Plus SILICONE BORDER Dressing 4x4 (in/in) (Home Health) 1 x Per Day/30 Days Discharge Instructions: Please do not put silicone bordered dressings under wraps. Use non-bordered dressing only. Wound #6 - Calcaneus Wound Laterality: Right Cleanser: Vashe 5.8 (oz) (Home Health) 1 x Per Day/30 Days Discharge Instructions: damp to dry daily over Santyl Secondary Dressing: ABD Pad 5x9 (in/in) 1 x Per Day/30 Days Discharge Instructions: Cover with ABD pad Secondary Dressing: Kerlix 4.5 x 4.1 (in/yd) 1 x Per Day/30 Days Discharge Instructions: Apply Kerlix 4.5 x 4.1 (in/yd) as instructed Secured With: ACE WRAP - 54M ACE Elastic Bandage With VELCRO Brand Closure, 4 (in) 1 x Per Day/30 Days Electronic Signature(s) Signed: 07/17/2023 12:24:25 PM By: Geralyn Corwin DO Entered By: Geralyn Corwin on 07/17/2023 12:15:28 -------------------------------------------------------------------------------- Problem List Details Patient Name: Date of Service: [facility], [name]. 07/17/2023 11:00 A M Medical Record Number: [id number] Patient Account Number: [id number] Date of Birth/Sex: Treating RN: 08/01/1941 (83 y.o. [name] Primary Care Provider: Barbette Reichmann Other Clinician: Betha Loa Referring Provider: Treating Provider/Extender: Geralyn Corwin  Hande, Vishwanath Weeks in Treatment: [address] [name], [name] ([id number]) [id number] Page 8 of 15 ICD-10 Encounter Code Description Active Date MDM Diagnosis L97.822 Non-pressure chronic ulcer of other part of left lower leg with fat layer 09/26/2022 No Yes exposed L97.812 Non-pressure chronic ulcer of other part of right lower leg with fat layer 03/13/2023 No Yes exposed I87.312 Chronic venous hypertension (idiopathic) with ulcer of left lower extremity 09/26/2022 No Yes I89.0 Lymphedema, not elsewhere classified 09/26/2022 No Yes E11.622 Type 2 diabetes mellitus with other skin ulcer 09/26/2022 No Yes L89.610 Pressure ulcer of right heel, unstageable 09/26/2022 No Yes L89.513 Pressure ulcer of right ankle, stage 3 12/05/2022 No Yes L89.620 Pressure ulcer of left heel, unstageable 12/05/2022 No Yes M86.172 Other acute osteomyelitis, left ankle and foot 10/24/2022 No Yes L89.214 Pressure ulcer of right hip, stage 4 11/21/2022 No Yes L89.220 Pressure ulcer of left hip, unstageable 05/22/2023 No Yes I50.32 Chronic diastolic (congestive) heart failure 09/26/2022 No Yes Z79.84 Long term (current) use of oral hypoglycemic drugs 03/13/2023 No Yes Inactive Problems ICD-10 Code Description Active Date Inactive Date S81.802A Unspecified open wound, left lower leg, initial encounter 05/22/2023 05/22/2023 Resolved Problems ICD-10 Code Description Active Date Resolved Date L89.320 Pressure ulcer of left buttock, unstageable 09/26/2022 09/26/2022 S91.302A Unspecified open wound, left foot, initial encounter 10/24/2022 10/24/2022 RUNE, TYSON ([id number]) [id number] Page 9 of 15 Electronic Signature(s) Signed: 07/17/2023 12:24:25 PM By: Geralyn Corwin DO Entered By: Geralyn Corwin on 07/17/2023  12:10:18 -------------------------------------------------------------------------------- Progress Note Details Patient Name: Date of Service: [facility], [name]. 07/17/2023 11:00 A M Medical Record Number: [id number] Patient Account Number: [id number] Date of Birth/Sex: Treating RN: October 03, 1941 (82 y.o. [name] Primary Care Provider: Barbette Reichmann Other Clinician: Betha Loa Referring Provider: Treating Provider/Extender: Adolph Pollack Weeks in Treatment: 66 Subjective Chief Complaint Information obtained from Patient Right heel wound, right hip wound, right ankle wound, left ankle wound, right lower extremity wound, left anterior lower extremity wound, left hip wound History of Present Illness (HPI) 82 year old patient who comes with a referral for bilateral lower extremity edema and a lower extremity ulceration and has been sent by her PCP [name]. I understand the patient was recently put on amoxicillin and doxycycline but could not tolerate the amoxicillin. doxycycline course was completed. a BNP and EKG was supposed to be normal and the patient did not have any dyspnea. the patient has been on a diuretic. The patient was also prescribed a pair of elastic compression stockings of the 20-30 mmHg pressure variety. x-ray of the right ankle was done on 09/20/2015 and showed posttraumatic and postsurgical changes of the right ankle with secondary degenerative changes of the tibiotalar joint and to a lesser degree the subtalar joint. No definite acute bony abnormalities are noted. Past medical history significant for diabetes mellitus, hypertension, hyperlipidemia, right breast cancer treated with a mastectomy in 2014. She has never smoked. 10/24/2015 -- she had delayed her vascular test because of her husband surgery but she is now ready to get him taken care of. He is also unable to use compression stockings and hence we will need to order her  Juzo wraps. 10/31/2015-- was seen by Dr. Wyn Quaker on 10/28/2015. She had a left lower extremity arterial duplex done at his office a couple of years ago and that was essentially normal. Today they performed a venous duplex which revealed no evidence of deep vein thrombosis, superficial thrombophlebitis, no venous reflex was seen on the right and a  minimal amount of reflux was seen on the left great saphenous vein but no significant reflux was seen. Impression was that there was a component of lymphedema present from a previous surgery and he would recommend compression stockings and leg elevation. Readmission: 07-24-2022 upon evaluation today patient presents for initial inspection here in our clinic concerning issues that she has been having with her legs this is actually been going on for several years according to what her family member with her today tells me as well as what the patient reiterates as well. She is currently most recently been seeing Dr. Ether Griffins and subsequently he had her in [place] boots. However 2 weeks ago he referred her to [place] and then subsequently took her out of the Unna boot wraps at that point. At this time the left leg looks to be worse in the right leg currently. She is on Lasix and lisinopril with hydrochlorothiazide she has high blood pressure she also has issues currently with lower extremity swelling and edema which has been an ongoing issue for her as well. Patient does have a history of chronic venous hypertension, lymphedema, diabetes mellitus type 2, hypertension, peripheral vascular disease, and neuropathy. Currently she is on Lasix as well as lisinopril with hydrochlorothiazide. 08-02-2022 upon evaluation today patient presents for reevaluation the good news is she is actually doing somewhat better in regard to the wound and the overall appearance and sinuses. The unfortunate thing is her infection really is not significantly improved we did have to switch out her  antibiotic once we got that final result back and I switched her to Levaquin and away from the doxycycline. Unfortunately the doxycycline had been doing poorly for her. In fact she had had diarrhea from the time she started taking it on Friday and she is still having it when she shows up today for evaluation. Again I was not aware of this and obviously she does appear to be somewhat dehydrated as well based on what I see. My concern which I discussed with the patient today is the possibility of a C. difficile infection. With that being said fact this started immediately upon taking the doxycycline makes me think that it was just the medicine and is not completely out of her system yet despite having taken the last dose Tuesday morning. Nonetheless with what we are seeing currently I want to be sure that reason I Minna contact her primary care provider and see if a would be willing to see her and test for C. difficile infection. 08-07-2022 upon evaluation today patient appears to be doing well currently in regard to her wounds which are actually measuring much better this is great news. Fortunately I do not see any signs of active infection locally or systemically at this time which is great as well. The good news is she was tested for a C. difficile infection and it was negative. I am very pleased and thankful for primary care provider for doing that so this means that she was just having a severe reaction to the doxycycline we have added that to her allergy list at this point. 08-14-2022 upon evaluation today patient appears to be doing well currently in regard to her dehydration she is actually significantly improved compared to last time I saw her last week. With that being said I do not see any evidence of active infection systemically at this time which is great news. However locally she still does appear to have cellulitis in left lower extremity. I discussed  with her today that I do believe she  would benefit from going ahead and starting the Levaquin. Again we will concerned about the C. difficile infection of the diarrhea but that this turned out to be just an issue with a reaction to the doxycycline. My hope is that the Levaquin will not cause her any complication and will be able to treat the infection. I did review her arterial study as well which was dated on [name], [name] ([id number]) [id number] Page 10 of 15 07-31-2022. It showed that she had a ABI on the right of 1.30 and on the left of 1.27 with a TBI on the right of 1.12 and the left 1.21 this is a normal arterial study. Again on the patient's wound culture she actually did show evidence of Proteus, Morganella, and MRSA. Levaquin is a good option here across the board. 09/26/2022 Ms. Diane Tabin is a 82 year old female with a past medical history of type 2 diabetes currently controlled on oral agents, venous insufficiency/lymphedema, right breast cancer and chronic diastolic heart failure that presents to the clinic for a 1 month history of nonhealing wounds to the left lower extremity, right heel and left buttocks. She states that the buttocks wound and the right heel wound developed while she was in the hospital. She was admitted on 08/22/2022 for severe sepsis secondary to acute sigmoid and distal colonic diverticulitis. At that time it was noted she had a stage I decubitus ulcer to her bilateral buttocks. A wound to the heel was not mentioned. She currently denies systemic signs of infection. She came into clinic in a blue gown with no undergarments. She has not been dressing the wounds. She states she just recently obtained home health and they are coming out for the first time this week. She is seen in our clinic often for lower extremity wounds secondary to venous insufficiency. 11/22; patient presents for follow-up. Son is present during the encounter. Per son it sounds like they are not  doing any dressing changes. She states she has home health but they have not come out. She is going to let us know which home health agency she has been approved for so we can send orders. Currently she denies signs of infection. 12/13; patient presents for follow-up. Patient has home health and they are coming out once a week. It appears that there has not been any dressing changes except for with home health. Patient has a newly discovered wound to the left foot. There is exposed bone. There is slight erythema and increased warmth to the surrounding tissue. Patient is completely unaware of this wound. 12/20; patient presents for follow-up. She has been taking her oral antibiotics. She now has an eschar and wound to the right hip. She has been using Dakin's wet-to-dry dressings to the left foot wound and buttocks wound. She has been using Medihoney and silver alginate to the right heel wound. She currently denies systemic signs of infection. She is mainly bedbound or in a wheelchair. 1/10; patient presents for follow-up. Patient had her left second toenail removed by Dr. Ether Griffins, podiatry on 12/21. Unfortunately she did not feel well and she was admitted to the hospital for sepsis. Her right heel wound was thought to be infected and this was debrided in the OR by Dr. Ether Griffins. Culture and bone biopsy had no growth. She has been using Medihoney to all the wound beds except for the lefty buttocks wound for which she uses Dakin's wet-to-dry dressings. She has developed  a pressure injury to the left heel now. This is despite having Prevalon boots. Although she is not wearing them today. She has completed 4 weeks of Augmentin and also a week of doxycycline for osteomyelitis of the left foot. Her left foot wound no longer probes to bone. Currently she denies signs of infection. 1/24; patient presents for follow-up. She has been using Medihoney and Hydrofera Blue to the wound beds except for this buttocks  wound she has been using Dakin's wet-to-dry dressings. She has developed 2 new wounds 1 to the left heel and the other to the right medial ankle. She claims she is wearing her Prevalon boots all the time although she does not have them on today. She currently denies signs of infection. 2/21; patient presents for follow-up. She was recently hospitalized on 12/19/2022 for sepsis secondary to Enterococcus bacillus bacteremia. She was treated with IV antibiotics and has completed her discharge oral antibiotics. She had a CT scanning of the abdomen/pelvis without acute abnormalities. Husband is present with patient today. They have been using Dakin's wet-to-dry dressings to the sacral wound and hip wound and Hydrofera Blue and Medihoney to the feet wounds. They are not changing the dressings daily. It is unclear how often they actually change the dressings. She has been wearing her Prevalon boots at night but not during the day. She currently denies systemic signs of infection. 3/20; patient presents for follow-up. She again was in the hospital for severe sepsis on 2/24; MRIs at that time showed findings suspicious for osteomyelitis to the right hip. She is currently on Augmentin to complete 4 weeks of this. She is following with ID. She has been using Dakin's wet-to-dry dressings to the sacrum and right hip however the solution is starting to cause discomfort. T the rest of the wound she has been using Hydrofera Blue and Medihoney. Patient o and son are not interested in doing palliative care or hospice. They state that they had discussions while in the hospital. 4/17; patient presents for follow-up. She has been using Vashe wet-to-dry dressings to the sacrum and right trochanter wound. She has been using Medihoney and Hydrofera Blue to the bilateral feet wounds. She has no issues or complaints today. She denies signs of infection. 5/1; patient developed a new wound to the right leg. Son was present and  states that there was a scab and he removed it creating a wound. He has been keeping the area covered. T the sacral and right trochanter wound they have been using Vashe wet-to-dry dressings and Medihoney and Hydrofera Blue to the o bilateral feet wounds. She denies signs of infection. 6/1; 1 month follow-up. She has a new area on the left anterior lower leg. In the meantime the sacral ulcer is closed. She still has an extensive wound on the right hip with exposed tendon over the greater trochanter. On the lower extremities the left anterior lower leg wound is new right ankle left heel and right heel all look reasonably stable. We are using Vashe wet-to-dry on the hip Medihoney and Hydrofera Blue on the lower extremities. She is accompanied by her son I think who is doing the dressings. They do not have a pressure-relief surface at home but they do have a hospital bed 7/10; I have not seen this patient since 03/13/2023. She was scheduled to be seen on 5/29 however missed her appointment due to illness. She was seen on 6/5 by Dr. Leanord Hawking and she presents today for her follow-up. Unfortunately her wounds  have declined since I last saw her. She has several new wounds including one to the left hip wound and one left anterior lower leg. The right hip wound has declined and has maggots with bone exposed. She has been using Vashe wet- to-dry dressings to Right hip wound and Medihoney and Hydrofera Blue to the The remaining wounds. Sacral wound remains closed. 7/24; patient presents for follow-up. At last clinic visit I recommended she go to the ED for potential admission, IV antibiotics and further imaging. She had MRI of the pelvis and left foot that showed osteomyelitis. Podiatry advised conservative management. She was also seen by infectious disease who discharged patient on 2 weeks of linezolid and 2 weeks of ciprofloxacin. Patient is at home and has home health. Palliative care was consulted during the  stay and she is full code and has declined further outpatient palliative care and hospice services.. She currently denies systemic signs of infection. Overall there is improvement in the appearance of the wounds. There is healthier granulation tissue present. However this is overall poor prognosis case. She has Prevalon boots but it is unclear if she is using these. She does not have them on today. She follows up with infectious disease next week. 8/7; Patient's been using Vashe wet-to-dry dressing to all the wound beds except for the left hip she has been using Santyl. She followed up with infectious disease on 8/1. Currently she is on linezolid and ciprofloxacin. They recommended lab work to see if she needs extension of her oral antibiotics. She currently denies systemic signs of infection. 8/21; patient presents for follow-up. She has been using Vashe wet-to-dry dressings to all wounds except the left hip she has been using Santyl. All wounds have improved in size and appearance. She has healthy granulation tissue present to all wounds except the left hip still has nonviable tissue throughout. She states she has completed her oral antibiotics per ID. She currently denies systemic signs of infection. 9/4; patient presents for follow-up. She has been using Vashe wet-to-dry dressings to all wounds except the left hip. T the left hip she has been using Santyl. o All wounds appear well-healing. She currently denies systemic signs of infection. [name], [name] ([id number]) [id number] Page 11 of 15 Objective Constitutional Vitals Time Taken: 11:09 AM, Weight: 98 lbs, Temperature: 97.7 F, Pulse: 77 bpm, Respiratory Rate: 18 breaths/min, Blood Pressure: 107/61 mmHg. General Notes: T the lower extremities and feet there are open wounds with granulation tissue and nonviable tissue. T the right hip there is an open wound with o o granulation tissue. There is probing to bone  on exam. T the left trochanter there is an open wound with nonviable tissue throughout. No signs of surrounding o soft tissue infection to any of the wound beds. Integumentary (Hair, Skin) Wound #11 status is Open. Original cause of wound was Pressure Injury. The date acquired was: 10/24/2022. The wound has been in treatment 37 weeks. The wound is located on the Right Trochanter. The wound measures 2.2cm length x 3.4cm width x 1.5cm depth; 5.875cm^2 area and 8.812cm^3 volume. There is muscle, tendon, Fat Layer (Subcutaneous Tissue), and fascia exposed. Tunneling has been noted at 9:00 with a maximum distance of 3.3cm. There is additional tunneling and at 4:00 with a maximum distance of 2.5cm. Undermining begins at 12:00 and ends at 12:00 with a maximum distance of 1.4cm. There is a medium amount of serosanguineous drainage noted. The wound margin is well defined and not attached to the wound  base. There is large (67-100%) red granulation within the wound bed. There is no necrotic tissue within the wound bed. Wound #13 status is Open. Original cause of wound was Pressure Injury. The date acquired was: 11/28/2022. The wound has been in treatment 32 weeks. The wound is located on the Left Calcaneus. The wound measures 1.8cm length x 1.7cm width x 0.2cm depth; 2.403cm^2 area and 0.481cm^3 volume. There is Fat Layer (Subcutaneous Tissue) exposed. There is a medium amount of serosanguineous drainage noted. The wound margin is flat and intact. There is medium (34-66%) pink granulation within the wound bed. There is a medium (34-66%) amount of necrotic tissue within the wound bed including Eschar and Adherent Slough. Wound #14 status is Open. Original cause of wound was Gradually Appeared. The date acquired was: 03/10/2023. The wound has been in treatment 18 weeks. The wound is located on the Right,Distal,Medial Lower Leg. The wound measures 8.5cm length x 3.5cm width x 0.2cm depth; 23.366cm^2 area and  4.673cm^3 volume. There is Fat Layer (Subcutaneous Tissue) exposed. There is a large amount of serosanguineous drainage noted. There is large (67-100%) red granulation within the wound bed. There is a small (1-33%) amount of necrotic tissue within the wound bed including Adherent Slough. Wound #15 status is Open. Original cause of wound was Pressure Injury. The date acquired was: 04/11/2023. The wound has been in treatment 13 weeks. The wound is located on the Left,Anterior Lower Leg. The wound measures 1.7cm length x 1.7cm width x 0.1cm depth; 2.27cm^2 area and 0.227cm^3 volume. There is tendon and Fat Layer (Subcutaneous Tissue) exposed. There is a medium amount of serosanguineous drainage noted. There is large (67-100%) red granulation within the wound bed. There is a small (1-33%) amount of necrotic tissue within the wound bed including Adherent Slough. Wound #16 status is Open. Original cause of wound was Pressure Injury. The date acquired was: 05/22/2023. The wound has been in treatment 8 weeks. The wound is located on the Left Trochanter. The wound measures 1.5cm length x 1cm width x 0.6cm depth; 1.178cm^2 area and 0.707cm^3 volume. There is Fat Layer (Subcutaneous Tissue) exposed. There is undermining starting at 3:00 and ending at 9:00 with a maximum distance of 0.7cm. There is a medium amount of serosanguineous drainage noted. There is a large (67-100%) amount of necrotic tissue within the wound bed including Eschar and Adherent Slough. Wound #6 status is Open. Original cause of wound was Pressure Injury. The date acquired was: 07/23/2022. The wound has been in treatment 42 weeks. The wound is located on the Right Calcaneus. The wound measures 2.5cm length x 2cm width x 0.1cm depth; 3.927cm^2 area and 0.393cm^3 volume. There is Fat Layer (Subcutaneous Tissue) exposed. There is a medium amount of serosanguineous drainage noted. The wound margin is distinct with the outline attached to the wound  base. There is medium (34-66%) red, pink granulation within the wound bed. There is a medium (34-66%) amount of necrotic tissue within the wound bed including Eschar and Adherent Slough. Assessment Active Problems ICD-10 Non-pressure chronic ulcer of other part of left lower leg with fat layer exposed Non-pressure chronic ulcer of other part of right lower leg with fat layer exposed Chronic venous hypertension (idiopathic) with ulcer of left lower extremity Lymphedema, not elsewhere classified Type 2 diabetes mellitus with other skin ulcer Pressure ulcer of right heel, unstageable Pressure ulcer of right ankle, stage 3 Pressure ulcer of left heel, unstageable Other acute osteomyelitis, left ankle and foot Pressure ulcer of right hip, stage  4 Pressure ulcer of left hip, unstageable Chronic diastolic (congestive) heart failure Long term (current) use of oral hypoglycemic drugs Patient's wounds have healthier granulation tissue present today. I debrided nonviable tissue. I recommended continuing the course with Vashe wet-to-dry dressings to all wounds except the left hip which I recommended continuing Santyl here. Continue aggressive offloading. Unfortunate situation with multiple wounds and a patient with failure to thrive picture. I Continued to recommend increasing protein intake. No signs of active infection today. Follow-up in 2 weeks. Procedures Wound #13 Pre-procedure diagnosis of Wound #13 is a Pressure Ulcer located on the Left Calcaneus . There was a Selective/Open Wound Non-Viable Tissue Debridement with a total area of 2.4 sq cm performed by Geralyn Corwin, MD. With the following instrument(s): Curette to remove Viable and Non-Viable tissue/material. Material removed includes Slough. A time out was conducted at 11:46, prior to the start of the procedure. A Minimum amount of bleeding was controlled with Pressure. The procedure was tolerated well. Post Debridement Measurements:  1.8cm length x 1.7cm width x 0.2cm depth; 0.481cm^3 volume. Post debridement Stage noted as Category/Stage III. Character of Wound/Ulcer Post Debridement is stable. [name], [name] ([id number]) [id number] Page 12 of 15 Post procedure Diagnosis Wound #13: Same as Pre-Procedure Wound #16 Pre-procedure diagnosis of Wound #16 is a Pressure Ulcer located on the Left Trochanter . There was a Selective/Open Wound Non-Viable Tissue Debridement with a total area of 1.18 sq cm performed by Geralyn Corwin, MD. With the following instrument(s): Forceps, and Scissors to remove Viable and Non-Viable tissue/material. Material removed includes Slough. A time out was conducted at 11:49, prior to the start of the procedure. A Minimum amount of bleeding was controlled with Pressure. The procedure was tolerated well. Post Debridement Measurements: 1.5cm length x 1cm width x 0.5cm depth; 0.589cm^3 volume. Post debridement Stage noted as Unstageable/Unclassified. Character of Wound/Ulcer Post Debridement is stable. Post procedure Diagnosis Wound #16: Same as Pre-Procedure Wound #6 Pre-procedure diagnosis of Wound #6 is a Pressure Ulcer located on the Right Calcaneus . There was a Selective/Open Wound Skin/Epidermis Debridement with a total area of 3.92 sq cm performed by Geralyn Corwin, MD. With the following instrument(s): Curette to remove Viable and Non-Viable tissue/material. Material removed includes Slough, Skin: Dermis, and Skin: Epidermis. A time out was conducted at 11:47, prior to the start of the procedure. A Minimum amount of bleeding was controlled with Pressure. The procedure was tolerated well. Post Debridement Measurements: 2.5cm length x 2cm width x 0.1cm depth; 0.393cm^3 volume. Post debridement Stage noted as Category/Stage III. Character of Wound/Ulcer Post Debridement is stable. Post procedure Diagnosis Wound #6: Same as Pre-Procedure Plan Follow-up  Appointments: Return Appointment in 2 weeks. [facility] Health: [facility] for [facility] care. May utilize formulary equivalent dressing for wound treatment orders unless otherwise specified. Home Health Nurse may visit PRN to address patients wound care needs. - left trochanter, right heel- santyl daily all other wounds- vashe damp tp dry daily Please follow up on group 2 low air loss mattress Off-Loading: Low air-loss mattress (Group 2) WOUND #11: - Trochanter Wound Laterality: Right Cleanser: Vashe 5.8 (oz) (Home Health) 1 x Per Day/30 Days Discharge Instructions: damp to dry daily Secondary Dressing: (BORDER) Zetuvit Plus SILICONE BORDER Dressing 5x5 (in/in) 1 x Per Day/30 Days Discharge Instructions: Please do not put silicone bordered dressings under wraps. Use non-bordered dressing only. WOUND #13: - Calcaneus Wound Laterality: Left Cleanser: Vashe 5.8 (oz) (Home Health) 1 x Per Day/30 Days Discharge Instructions: damp to dry  daily Secondary Dressing: ABD Pad 5x9 (in/in) 1 x Per Day/30 Days Discharge Instructions: Cover with ABD pad Secondary Dressing: Kerlix 4.5 x 4.1 (in/yd) 1 x Per Day/30 Days Discharge Instructions: Apply Kerlix 4.5 x 4.1 (in/yd) as instructed Secured With: ACE WRAP - 45M ACE Elastic Bandage With VELCRO Brand Closure, 4 (in) 1 x Per Day/30 Days WOUND #14: - Lower Leg Wound Laterality: Right, Medial, Distal Cleanser: Vashe 5.8 (oz) (Home Health) 1 x Per Day/30 Days Discharge Instructions: damp to dry daily Secondary Dressing: ABD Pad 5x9 (in/in) 1 x Per Day/30 Days Discharge Instructions: Cover with ABD pad Secondary Dressing: Kerlix 4.5 x 4.1 (in/yd) 1 x Per Day/30 Days Discharge Instructions: Apply Kerlix 4.5 x 4.1 (in/yd) as instructed Secured With: ACE WRAP - 45M ACE Elastic Bandage With VELCRO Brand Closure, 4 (in) 1 x Per Day/30 Days WOUND #15: - Lower Leg Wound Laterality: Left, Anterior Cleanser: Vashe 5.8 (oz) (Home Health) 1 x Per Day/30  Days Discharge Instructions: damp to dry daily Secondary Dressing: ABD Pad 5x9 (in/in) 1 x Per Day/30 Days Discharge Instructions: Cover with ABD pad Secondary Dressing: Kerlix 4.5 x 4.1 (in/yd) 1 x Per Day/30 Days Discharge Instructions: Apply Kerlix 4.5 x 4.1 (in/yd) as instructed Secured With: ACE WRAP - 45M ACE Elastic Bandage With VELCRO Brand Closure, 4 (in) 1 x Per Day/30 Days WOUND #16: - Trochanter Wound Laterality: Left Cleanser: Normal Saline (Home Health) 1 x Per Day/30 Days Discharge Instructions: Wash your hands with soap and water. Remove old dressing, discard into plastic bag and place into trash. Cleanse the wound with Normal Saline prior to applying a clean dressing using gauze sponges, not tissues or cotton balls. Do not scrub or use excessive force. Pat dry using gauze sponges, not tissue or cotton balls. Topical: Santyl Collagenase Ointment, 30 (gm), tube (Home Health) 1 x Per Day/30 Days Discharge Instructions: apply nickel thick to wound bed only Prim Dressing: Gauze 1 x Per Day/30 Days ary Discharge Instructions: Vashe damp to dry over Santyl Secondary Dressing: (BORDER) Zetuvit Plus SILICONE BORDER Dressing 4x4 (in/in) (Home Health) 1 x Per Day/30 Days Discharge Instructions: Please do not put silicone bordered dressings under wraps. Use non-bordered dressing only. WOUND #6: - Calcaneus Wound Laterality: Right Cleanser: Vashe 5.8 (oz) (Home Health) 1 x Per Day/30 Days Discharge Instructions: damp to dry daily over Santyl Secondary Dressing: ABD Pad 5x9 (in/in) 1 x Per Day/30 Days Discharge Instructions: Cover with ABD pad Secondary Dressing: Kerlix 4.5 x 4.1 (in/yd) 1 x Per Day/30 Days Discharge Instructions: Apply Kerlix 4.5 x 4.1 (in/yd) as instructed Secured With: ACE WRAP - 45M ACE Elastic Bandage With VELCRO Brand Closure, 4 (in) 1 x Per Day/30 Days 1. In office sharp debridement 2. Vashe wet-to-dry dressings [name], [name] ([id number])  [id number] Page 13 of 15 3. Santyl 4. Aggressive offloading -air loss mattress 5. Follow-up in 2 weeks Electronic Signature(s) Signed: 07/17/2023 12:24:25 PM By: Geralyn Corwin DO Entered By: Geralyn Corwin on 07/17/2023 12:14:47 -------------------------------------------------------------------------------- ROS/PFSH Details Patient Name: Date of Service: [facility], [name]. 07/17/2023 11:00 A M Medical Record Number: [id number] Patient Account Number: [id number] Date of Birth/Sex: Treating RN: 1940/12/31 (82 y.o. [name] Primary Care Provider: Barbette Reichmann Other Clinician: Betha Loa Referring Provider: Treating Provider/Extender: Adolph Pollack Weeks in Treatment: 32 Information Obtained From Patient Eyes Medical History: Positive for: Cataracts - removed 2 years ago Negative for: Glaucoma; Optic Neuritis Ear/Nose/Mouth/Throat Medical History: Negative for: Chronic sinus problems/congestion; Middle ear problems  Hematologic/Lymphatic Medical History: Positive for: Lymphedema Negative for: Anemia; Hemophilia; Human Immunodeficiency Virus; Sickle Cell Disease Respiratory Medical History: Negative for: Aspiration; Asthma; Chronic Obstructive Pulmonary Disease (COPD); Pneumothorax; Sleep Apnea; Tuberculosis Cardiovascular Medical History: Positive for: Hypertension; Peripheral Arterial Disease; Peripheral Venous Disease Gastrointestinal Medical History: Negative for: Cirrhosis ; Colitis; Crohns; Hepatitis A; Hepatitis B; Hepatitis C Endocrine Medical History: Positive for: Type II Diabetes Time with diabetes: 8years Treated with: Oral agents Blood sugar tested every day: No Genitourinary [name], [name] ([id number]) [id number] Page 14 of 15 Medical History: Past Medical History Notes: Hx of Kidney stones, hysterectomy Musculoskeletal Medical History: Positive for:  Osteoarthritis Past Medical History Notes: Rotator cuff disorder Neurologic Medical History: Positive for: Neuropathy Negative for: Dementia; Quadriplegia; Paraplegia; Seizure Disorder Oncologic Medical History: Past Medical History Notes: Breast Ca Psychiatric Medical History: Negative for: Anorexia/bulimia; Confinement Anxiety HBO Extended History Items Eyes: Cataracts Immunizations Pneumococcal Vaccine: Received Pneumococcal Vaccination: Yes Received Pneumococcal Vaccination On or After 60th Birthday: Yes Implantable Devices None Family and Social History Cancer: No; Diabetes: No; Heart Disease: Yes - Mother,Father; Hereditary Spherocytosis: No; Hypertension: Yes - Mother,Father; Kidney Disease: No; Lung Disease: No; Seizures: No; Stroke: Yes - Mother,Father; Thyroid Problems: No; Tuberculosis: No; Never smoker; Marital Status - Married; Alcohol Use: Never; Drug Use: No History; Caffeine Use: Daily; Financial Concerns: No; Food, Clothing or Shelter Needs: No; Support System Lacking: No; Transportation Concerns: No Electronic Signature(s) Signed: 07/17/2023 12:24:25 PM By: Geralyn Corwin DO Signed: 07/18/2023 1:40:54 PM By: Yevonne Pax RN Entered By: Geralyn Corwin on 07/17/2023 12:15:42 -------------------------------------------------------------------------------- SuperBill Details Patient Name: Date of Service: [facility], [name]. 07/17/2023 Medical Record Number: [id number] Patient Account Number: [id number] Date of Birth/Sex: Treating RN: [birth year] (82 y.o. [name] Primary Care Provider: Barbette Reichmann Other Clinician: Betha Loa Referring Provider: Treating Provider/Extender: Adolph Pollack Weeks in Treatment: [address] Diagnosis [address] Kairah [name] ([id number]) [id number] Page 15 of 15 ICD-10 Codes Code Description [phone] Non-pressure chronic ulcer of other part of left lower leg with fat layer  exposed L97.812 Non-pressure chronic ulcer of other part of right lower leg with fat layer exposed I87.312 Chronic venous hypertension (idiopathic) with ulcer of left lower extremity I89.0 Lymphedema, not elsewhere classified E11.622 Type 2 diabetes mellitus with other skin ulcer L89.610 Pressure ulcer of right heel, unstageable L89.513 Pressure ulcer of right ankle, stage 3 L89.620 Pressure ulcer of left heel, unstageable M86.172 Other acute osteomyelitis, left ankle and foot L89.214 Pressure ulcer of right hip, stage 4 L89.220 Pressure ulcer of left hip, unstageable I50.32 Chronic diastolic (congestive) heart failure Z79.84 Long term (current) use of oral hypoglycemic drugs Facility Procedures : CPT4 Code: [id number] Description: 97597 - DEBRIDE WOUND 1ST 20 SQ CM OR < ICD-10 Diagnosis Description L89.610 Pressure ulcer of right heel, unstageable L89.620 Pressure ulcer of left heel, unstageable L89.220 Pressure ulcer of left hip, unstageable Modifier: Quantity: 1 Physician Procedures : CPT4 Code Description Modifier [id number] - WC PHYS DEBR WO ANESTH 20 SQ CM ICD-10 Diagnosis Description L89.610 Pressure ulcer of right heel, unstageable L89.620 Pressure ulcer of left heel, unstageable L89.220 Pressure ulcer of left hip,  unstageable Quantity: 1 Electronic Signature(s) Signed: 07/17/2023 12:24:25 PM By: Geralyn Corwin DO Entered By: Geralyn Corwin on 07/17/2023 12:15:15

## 2023-07-25 ENCOUNTER — Telehealth: Payer: Self-pay | Admitting: *Deleted

## 2023-07-25 DIAGNOSIS — L89890 Pressure ulcer of other site, unstageable: Secondary | ICD-10-CM | POA: Diagnosis not present

## 2023-07-25 DIAGNOSIS — L89623 Pressure ulcer of left heel, stage 3: Secondary | ICD-10-CM | POA: Diagnosis not present

## 2023-07-25 DIAGNOSIS — L89613 Pressure ulcer of right heel, stage 3: Secondary | ICD-10-CM | POA: Diagnosis not present

## 2023-07-25 DIAGNOSIS — L89894 Pressure ulcer of other site, stage 4: Secondary | ICD-10-CM | POA: Diagnosis not present

## 2023-07-25 NOTE — Telephone Encounter (Signed)
She will call tomorrow when home health is there.

## 2023-07-25 NOTE — Telephone Encounter (Signed)
Pt calling stating that her catheter " fell out" twice. Pt states home health placed it back in, pt requesting appointment due to cath coming out. I explained to pt that she would need for home health to check her cath tomorrow when they come out. Catheter is currently in place and draining in the bag but pt also states its "leaking" ?? I explained that if urine is in the bag it shouldn't be leaking. I also asked pt to give [place] a call when home health is there so we may talk with them.

## 2023-07-25 NOTE — Telephone Encounter (Signed)
She has an indwelling Foley.  Unclear per reports if it truly fell out completely requiring replacement or if it came disconnected from her drainage bag.  Catheter leaking could mean many different things.  I would recommend having home health evaluate the catheter and call us back with more information so that we can appropriately triage her.  Unfortunately, I do not have time to work her in this week unless it is an emergency.

## 2023-07-26 DIAGNOSIS — L89150 Pressure ulcer of sacral region, unstageable: Secondary | ICD-10-CM | POA: Diagnosis not present

## 2023-07-26 DIAGNOSIS — L89220 Pressure ulcer of left hip, unstageable: Secondary | ICD-10-CM | POA: Diagnosis not present

## 2023-07-31 ENCOUNTER — Encounter: Payer: PPO | Admitting: Internal Medicine

## 2023-07-31 DIAGNOSIS — R32 Unspecified urinary incontinence: Secondary | ICD-10-CM | POA: Diagnosis not present

## 2023-07-31 DIAGNOSIS — L89224 Pressure ulcer of left hip, stage 4: Secondary | ICD-10-CM | POA: Diagnosis not present

## 2023-07-31 DIAGNOSIS — E11622 Type 2 diabetes mellitus with other skin ulcer: Secondary | ICD-10-CM | POA: Diagnosis not present

## 2023-08-01 DIAGNOSIS — L89623 Pressure ulcer of left heel, stage 3: Secondary | ICD-10-CM | POA: Diagnosis not present

## 2023-08-01 DIAGNOSIS — L89613 Pressure ulcer of right heel, stage 3: Secondary | ICD-10-CM | POA: Diagnosis not present

## 2023-08-01 DIAGNOSIS — L89894 Pressure ulcer of other site, stage 4: Secondary | ICD-10-CM | POA: Diagnosis not present

## 2023-08-01 DIAGNOSIS — L89890 Pressure ulcer of other site, unstageable: Secondary | ICD-10-CM | POA: Diagnosis not present

## 2023-08-01 NOTE — Progress Notes (Signed)
[name], [name], [name] [name] in Treatment: 44 Wound Status Wound Number: 6 Primary Pressure Ulcer Etiology: Wound Location: Right Calcaneus Wound Open Wounding Event: Pressure Injury Status: Date Acquired: 07/23/2022 Comorbid Cataracts, Lymphedema, Hypertension, Peripheral Arterial Disease, [name] Of Treatment: 44 History: Peripheral Venous Disease, Type II Diabetes, Osteoarthritis, Clustered Wound: No Neuropathy Photos Wound Measurements Length: (cm) 2 Width: (cm) 2 Depth: (cm) 0.2 Area: (cm) 3.142 Volume: (cm) 0.628 % Reduction in Area: 66.7% % Reduction in Volume: 77.8% Epithelialization: None Wound Description Classification: Category/Stage III Wound Margin: Distinct, outline attached Exudate Amount: Medium Exudate Type: Serosanguineous Exudate Color: red, brown Foul Odor After Cleansing: No Slough/Fibrino Yes Wound Bed Granulation Amount: Medium (34-66%) Exposed Structure Granulation Quality: Red, Pink Fascia Exposed: No Necrotic Amount: Medium (34-66%) Fat Layer (Subcutaneous Tissue) Exposed: Yes [name], [name] [name] ([id number]) [id number] Page 17 of 17 Necrotic Quality: Eschar, Adherent Slough Tendon Exposed: No Muscle Exposed: No Joint Exposed: No Bone Exposed: No Treatment Notes Wound #6 (Calcaneus) Wound Laterality: Right Cleanser Vashe 5.8 (oz) Discharge Instruction: damp to dry daily over Santyl Peri-Wound Care Topical Primary Dressing Secondary  Dressing ABD Pad 5x9 (in/in) Discharge Instruction: Cover with ABD pad Kerlix 4.5 x 4.1 (in/yd) Discharge Instruction: Apply Kerlix 4.5 x 4.1 (in/yd) as instructed Secured With ACE WRAP - 8M ACE Elastic Bandage With VELCRO Brand Closure, 4 (in) Compression Wrap Compression Stockings Add-Ons Electronic Signature(s) Signed: 07/31/2023 5:25:51 PM By: Betha Loa Signed: 08/01/2023 1:18:15 PM By: Yevonne Pax RN Entered By: Betha Loa on 07/31/2023 11:38:28 -------------------------------------------------------------------------------- Vitals Details Patient Name: Date of Service: [facility], [name] [name]. 07/31/2023 11:00 A M Medical Record Number: [id number] Patient Account Number: [id number] Date of Birth/Sex: Treating RN: 1941/08/14 (82 y.o. [name] Primary Care Lilliane Sposito: Barbette Reichmann Other Clinician: Betha Loa Referring Jamaar Howes: Treating Mahli Glahn/Extender: [name], [name], [name] [name] in Treatment: 44 Vital Signs Time Taken: 11:11 Temperature (F): 97.7 Weight (lbs): 98 Pulse (bpm): 77 Respiratory Rate (breaths/min): 18 Blood Pressure (mmHg): 107/61 Reference Range: 80 - 120 mg / dl Electronic Signature(s) Signed: 07/31/2023 5:25:51 PM By: Betha Loa Entered By: Betha Loa on 07/31/2023 11:12:54  [name] Other Clinician: Betha Loa Referring Arlington Sigmund: Treating Daryus Sowash/Extender: [name], [name], [name] [name] in Treatment: 99 Active Inactive Electronic Signature(s) Signed: 07/31/2023 5:25:51 PM By: Betha Loa Signed: 08/01/2023 1:18:15 PM By: Yevonne Pax RN Entered By: Betha Loa on 07/31/2023 12:17:47 -------------------------------------------------------------------------------- Pain Assessment Details Patient Name: Date of Service: [facility], [name] [name]. 07/31/2023 11:00 A M Medical Record  Number: [id number] Patient Account Number: [id number] Date of Birth/Sex: Treating RN: [name], 1941 (82 y.o. [name] Primary Care Andria Head: Barbette Reichmann Other Clinician: Betha Loa Referring Nyhla Mountjoy: Treating Nike Southers/Extender: [name], [name], [name] [name] in Treatment: 44 Active Problems Location of Pain Severity and Description of Pain Patient Has Paino Yes Site Locations Pain Location: Pain in Ulcers Duration of the Pain. Constant / Intermittento Constant Rate the pain. Current Pain Level: 6 Zettler, [name] [name] ([id number]) [id number].pdf Page 7 of 17 Character of Pain Describe the Pain: Aching Pain Management and Medication Current Pain Management: Medication: Yes Cold Application: No Rest: No Massage: No Activity: No T.E.N.S.: No Heat Application: No Leg drop or elevation: No Is the Current Pain Management Adequate: Inadequate How does your wound impact your activities of daily livingo Sleep: No Bathing: No Appetite: No Relationship With Others: No Bladder Continence: No Emotions: No Bowel Continence: No Work: No Toileting: No Drive: No Dressing: No Hobbies: No Electronic Signature(s) Signed: 07/31/2023 5:25:51 PM By: Betha Loa Signed: 08/01/2023 1:18:15 PM By: Yevonne Pax RN Entered By: Betha Loa on 07/31/2023 11:12:59 -------------------------------------------------------------------------------- Patient/Caregiver Education Details Patient Name: Date of Service: [facility], [name] [name]. 9/18/2024andnbsp11:00 A M Medical Record Number: [id number] Patient Account Number: [id number] Date of Birth/Gender: Treating RN: [birth year] (82 y.o. [name] Primary Care Physician: Barbette Reichmann Other Clinician: Betha Loa Referring Physician: Treating Physician/Extender: [name], [name], [name] [name] in Treatment: 64 Education Assessment Education Provided To: Patient and  Caregiver Education Topics Provided Wound/Skin Impairment: Handouts: Other: continue wound care as directed Methods: Explain/Verbal Responses: State content correctly Electronic Signature(s) Signed: 07/31/2023 5:25:51 PM By: Betha Loa Entered By: Betha Loa on 07/31/2023 17:17:21 [name], [name] [name] ([id number]) [id number] Page 8 of 17 -------------------------------------------------------------------------------- Wound Assessment Details Patient Name: Date of Service: [facility], [name] [name]. 07/31/2023 11:00 A M Medical Record Number: [id number] Patient Account Number: [id number] Date of Birth/Sex: Treating RN: November 13, 1940 (82 y.o. [name] Primary Care Osmara Drummonds: Barbette Reichmann Other Clinician: Betha Loa Referring Daphna Lafuente: Treating Deontray Hunnicutt/Extender: [name], [name], [name] [name] in Treatment: 44 Wound Status Wound Number: 11 Primary Pressure Ulcer Etiology: Wound Location: Right Trochanter Wound Open Wounding Event: Pressure Injury Status: Date Acquired: 10/24/2022 Comorbid Cataracts, Lymphedema, Hypertension, Peripheral Arterial Disease, [name] Of Treatment: 39 History: Peripheral Venous Disease, Type II Diabetes, Osteoarthritis, Clustered Wound: No Neuropathy Photos Wound Measurements Length: (cm) 3.5 Width: (cm) 2.2 Depth: (cm) 1.8 Area: (cm) 6.048 Volume: (cm) 10.886 % Reduction in Area: -191.8% % Reduction in Volume: -5158.9% Epithelialization: None Tunneling: Yes Position (o'clock): 6 Maximum Distance: (cm) 3 Undermining: Yes Starting Position (o'clock): 12 Ending Position (o'clock): 12 Maximum Distance: (cm) 1.6 Wound Description Classification: Category/Stage IV Wound Margin: Well defined, not attached Exudate Amount: Medium Exudate Type: Serosanguineous Exudate Color: red, brown Foul Odor After Cleansing: No Slough/Fibrino Yes Wound Bed Granulation Amount: Large (67-100%) Exposed  Structure Granulation Quality: Red Fascia Exposed: Yes Necrotic Amount: None Present (0%) Fat Layer (Subcutaneous Tissue) Exposed: Yes Tendon Exposed: Yes Muscle Exposed: Yes Necrosis of Muscle: No Joint Exposed: No  PENELOPY, BIRNBAUM ([id number]) [id number] Page 1 of 17 Visit Report for 07/31/2023 Arrival Information Details Patient Name: Date of Service: [facility] [place] [name]. 07/31/2023 11:00 A M Medical Record Number: [id number] Patient Account Number: [id number] Date of Birth/Sex: Treating RN: 09-May-1941 (82 y.o. [name] Primary Care Damon Baisch: Barbette Reichmann Other Clinician: Betha Loa Referring Elliemae Braman: Treating Asra Gambrel/Extender: [name], [name], [name] [name] in Treatment: 44 Visit Information History Since Last Visit All ordered tests and consults were completed: No Patient Arrived: Wheel Chair Added or deleted any medications: No Arrival Time: 11:06 Any new allergies or adverse reactions: No Transfer Assistance: EasyPivot Patient Lift Had a fall or experienced change in No Patient Identification Verified: Yes activities of daily living that may affect Secondary Verification Process Completed: Yes risk of falls: Patient Requires Transmission-Based Precautions: No Signs or symptoms of abuse/neglect since last visito No Patient Has Alerts: Yes Hospitalized since last visit: No Patient Alerts: DM II Implantable device outside of the clinic excluding No ABI R 1.30 TBI 1.12 cellular tissue based products placed in the center ABI [name] 1.27 TBI 1.21 since last visit: Has Dressing in Place as Prescribed: Yes Pain Present Now: Yes Electronic Signature(s) Signed: 07/31/2023 5:25:51 PM By: Betha Loa Entered By: Betha Loa on 07/31/2023 11:10:54 -------------------------------------------------------------------------------- Clinic Level of Care Assessment Details Patient Name: Date of Service: [facility], [name] [name]. 07/31/2023 11:00 A M Medical Record Number: [id number] Patient Account Number: [id number] Date of Birth/Sex: Treating RN: Oct 19, 1941 (82 y.o. [name] Primary Care Toby Ayad: Barbette Reichmann Other Clinician:  Betha Loa Referring Brody Bonneau: Treating Kristalynn Coddington/Extender: [name], [name], [name] [name] in Treatment: 44 Clinic Level of Care Assessment Items TOOL 1 Quantity Score []  - 0 Use when EandM and Procedure is performed on INITIAL visit ASSESSMENTS - Nursing Assessment / Reassessment []  - 0 General Physical Exam (combine w/ comprehensive assessment (listed just below) when performed on new pt. evals) []  - 0 Comprehensive Assessment (HX, ROS, Risk Assessments, Wounds Hx, etc.) Peralta, Natika [name] ([id number]) [id number] Page 2 of 17 ASSESSMENTS - Wound and Skin Assessment / Reassessment []  - 0 Dermatologic / Skin Assessment (not related to wound area) ASSESSMENTS - Ostomy and/or Continence Assessment and Care []  - 0 Incontinence Assessment and Management []  - 0 Ostomy Care Assessment and Management (repouching, etc.) PROCESS - Coordination of Care []  - 0 Simple Patient / Family Education for ongoing care []  - 0 Complex (extensive) Patient / Family Education for ongoing care []  - 0 Staff obtains Chiropractor, Records, T Results / Process Orders est []  - 0 Staff telephones HHA, Nursing Homes / Clarify orders / etc []  - 0 Routine Transfer to another Facility (non-emergent condition) []  - 0 Routine Hospital Admission (non-emergent condition) []  - 0 New Admissions / Manufacturing engineer / Ordering NPWT Apligraf, etc. , []  - 0 Emergency Hospital Admission (emergent condition) PROCESS - Special Needs []  - 0 Pediatric / Minor Patient Management []  - 0 Isolation Patient Management []  - 0 Hearing / Language / Visual special needs []  - 0 Assessment of Community assistance (transportation, D/C planning, etc.) []  - 0 Additional assistance / Altered mentation []  - 0 Support Surface(s) Assessment (bed, cushion, seat, etc.) INTERVENTIONS - Miscellaneous []  - 0 External ear exam []  - 0 Patient Transfer (multiple staff / Nurse, adult /  Similar devices) []  - 0 Simple Staple / Suture removal (25 or less) []  - 0 Complex Staple / Suture removal (26 or more) []  -  [name] Other Clinician: Betha Loa Referring Arlington Sigmund: Treating Daryus Sowash/Extender: [name], [name], [name] [name] in Treatment: 99 Active Inactive Electronic Signature(s) Signed: 07/31/2023 5:25:51 PM By: Betha Loa Signed: 08/01/2023 1:18:15 PM By: Yevonne Pax RN Entered By: Betha Loa on 07/31/2023 12:17:47 -------------------------------------------------------------------------------- Pain Assessment Details Patient Name: Date of Service: [facility], [name] [name]. 07/31/2023 11:00 A M Medical Record  Number: [id number] Patient Account Number: [id number] Date of Birth/Sex: Treating RN: [name], 1941 (82 y.o. [name] Primary Care Andria Head: Barbette Reichmann Other Clinician: Betha Loa Referring Nyhla Mountjoy: Treating Nike Southers/Extender: [name], [name], [name] [name] in Treatment: 44 Active Problems Location of Pain Severity and Description of Pain Patient Has Paino Yes Site Locations Pain Location: Pain in Ulcers Duration of the Pain. Constant / Intermittento Constant Rate the pain. Current Pain Level: 6 Zettler, [name] [name] ([id number]) [id number].pdf Page 7 of 17 Character of Pain Describe the Pain: Aching Pain Management and Medication Current Pain Management: Medication: Yes Cold Application: No Rest: No Massage: No Activity: No T.E.N.S.: No Heat Application: No Leg drop or elevation: No Is the Current Pain Management Adequate: Inadequate How does your wound impact your activities of daily livingo Sleep: No Bathing: No Appetite: No Relationship With Others: No Bladder Continence: No Emotions: No Bowel Continence: No Work: No Toileting: No Drive: No Dressing: No Hobbies: No Electronic Signature(s) Signed: 07/31/2023 5:25:51 PM By: Betha Loa Signed: 08/01/2023 1:18:15 PM By: Yevonne Pax RN Entered By: Betha Loa on 07/31/2023 11:12:59 -------------------------------------------------------------------------------- Patient/Caregiver Education Details Patient Name: Date of Service: [facility], [name] [name]. 9/18/2024andnbsp11:00 A M Medical Record Number: [id number] Patient Account Number: [id number] Date of Birth/Gender: Treating RN: [birth year] (82 y.o. [name] Primary Care Physician: Barbette Reichmann Other Clinician: Betha Loa Referring Physician: Treating Physician/Extender: [name], [name], [name] [name] in Treatment: 64 Education Assessment Education Provided To: Patient and  Caregiver Education Topics Provided Wound/Skin Impairment: Handouts: Other: continue wound care as directed Methods: Explain/Verbal Responses: State content correctly Electronic Signature(s) Signed: 07/31/2023 5:25:51 PM By: Betha Loa Entered By: Betha Loa on 07/31/2023 17:17:21 [name], [name] [name] ([id number]) [id number] Page 8 of 17 -------------------------------------------------------------------------------- Wound Assessment Details Patient Name: Date of Service: [facility], [name] [name]. 07/31/2023 11:00 A M Medical Record Number: [id number] Patient Account Number: [id number] Date of Birth/Sex: Treating RN: November 13, 1940 (82 y.o. [name] Primary Care Osmara Drummonds: Barbette Reichmann Other Clinician: Betha Loa Referring Daphna Lafuente: Treating Deontray Hunnicutt/Extender: [name], [name], [name] [name] in Treatment: 44 Wound Status Wound Number: 11 Primary Pressure Ulcer Etiology: Wound Location: Right Trochanter Wound Open Wounding Event: Pressure Injury Status: Date Acquired: 10/24/2022 Comorbid Cataracts, Lymphedema, Hypertension, Peripheral Arterial Disease, [name] Of Treatment: 39 History: Peripheral Venous Disease, Type II Diabetes, Osteoarthritis, Clustered Wound: No Neuropathy Photos Wound Measurements Length: (cm) 3.5 Width: (cm) 2.2 Depth: (cm) 1.8 Area: (cm) 6.048 Volume: (cm) 10.886 % Reduction in Area: -191.8% % Reduction in Volume: -5158.9% Epithelialization: None Tunneling: Yes Position (o'clock): 6 Maximum Distance: (cm) 3 Undermining: Yes Starting Position (o'clock): 12 Ending Position (o'clock): 12 Maximum Distance: (cm) 1.6 Wound Description Classification: Category/Stage IV Wound Margin: Well defined, not attached Exudate Amount: Medium Exudate Type: Serosanguineous Exudate Color: red, brown Foul Odor After Cleansing: No Slough/Fibrino Yes Wound Bed Granulation Amount: Large (67-100%) Exposed  Structure Granulation Quality: Red Fascia Exposed: Yes Necrotic Amount: None Present (0%) Fat Layer (Subcutaneous Tissue) Exposed: Yes Tendon Exposed: Yes Muscle Exposed: Yes Necrosis of Muscle: No Joint Exposed: No  [name] Other Clinician: Betha Loa Referring Arlington Sigmund: Treating Daryus Sowash/Extender: [name], [name], [name] [name] in Treatment: 99 Active Inactive Electronic Signature(s) Signed: 07/31/2023 5:25:51 PM By: Betha Loa Signed: 08/01/2023 1:18:15 PM By: Yevonne Pax RN Entered By: Betha Loa on 07/31/2023 12:17:47 -------------------------------------------------------------------------------- Pain Assessment Details Patient Name: Date of Service: [facility], [name] [name]. 07/31/2023 11:00 A M Medical Record  Number: [id number] Patient Account Number: [id number] Date of Birth/Sex: Treating RN: [name], 1941 (82 y.o. [name] Primary Care Andria Head: Barbette Reichmann Other Clinician: Betha Loa Referring Nyhla Mountjoy: Treating Nike Southers/Extender: [name], [name], [name] [name] in Treatment: 44 Active Problems Location of Pain Severity and Description of Pain Patient Has Paino Yes Site Locations Pain Location: Pain in Ulcers Duration of the Pain. Constant / Intermittento Constant Rate the pain. Current Pain Level: 6 Zettler, [name] [name] ([id number]) [id number].pdf Page 7 of 17 Character of Pain Describe the Pain: Aching Pain Management and Medication Current Pain Management: Medication: Yes Cold Application: No Rest: No Massage: No Activity: No T.E.N.S.: No Heat Application: No Leg drop or elevation: No Is the Current Pain Management Adequate: Inadequate How does your wound impact your activities of daily livingo Sleep: No Bathing: No Appetite: No Relationship With Others: No Bladder Continence: No Emotions: No Bowel Continence: No Work: No Toileting: No Drive: No Dressing: No Hobbies: No Electronic Signature(s) Signed: 07/31/2023 5:25:51 PM By: Betha Loa Signed: 08/01/2023 1:18:15 PM By: Yevonne Pax RN Entered By: Betha Loa on 07/31/2023 11:12:59 -------------------------------------------------------------------------------- Patient/Caregiver Education Details Patient Name: Date of Service: [facility], [name] [name]. 9/18/2024andnbsp11:00 A M Medical Record Number: [id number] Patient Account Number: [id number] Date of Birth/Gender: Treating RN: [birth year] (82 y.o. [name] Primary Care Physician: Barbette Reichmann Other Clinician: Betha Loa Referring Physician: Treating Physician/Extender: [name], [name], [name] [name] in Treatment: 64 Education Assessment Education Provided To: Patient and  Caregiver Education Topics Provided Wound/Skin Impairment: Handouts: Other: continue wound care as directed Methods: Explain/Verbal Responses: State content correctly Electronic Signature(s) Signed: 07/31/2023 5:25:51 PM By: Betha Loa Entered By: Betha Loa on 07/31/2023 17:17:21 [name], [name] [name] ([id number]) [id number] Page 8 of 17 -------------------------------------------------------------------------------- Wound Assessment Details Patient Name: Date of Service: [facility], [name] [name]. 07/31/2023 11:00 A M Medical Record Number: [id number] Patient Account Number: [id number] Date of Birth/Sex: Treating RN: November 13, 1940 (82 y.o. [name] Primary Care Osmara Drummonds: Barbette Reichmann Other Clinician: Betha Loa Referring Daphna Lafuente: Treating Deontray Hunnicutt/Extender: [name], [name], [name] [name] in Treatment: 44 Wound Status Wound Number: 11 Primary Pressure Ulcer Etiology: Wound Location: Right Trochanter Wound Open Wounding Event: Pressure Injury Status: Date Acquired: 10/24/2022 Comorbid Cataracts, Lymphedema, Hypertension, Peripheral Arterial Disease, [name] Of Treatment: 39 History: Peripheral Venous Disease, Type II Diabetes, Osteoarthritis, Clustered Wound: No Neuropathy Photos Wound Measurements Length: (cm) 3.5 Width: (cm) 2.2 Depth: (cm) 1.8 Area: (cm) 6.048 Volume: (cm) 10.886 % Reduction in Area: -191.8% % Reduction in Volume: -5158.9% Epithelialization: None Tunneling: Yes Position (o'clock): 6 Maximum Distance: (cm) 3 Undermining: Yes Starting Position (o'clock): 12 Ending Position (o'clock): 12 Maximum Distance: (cm) 1.6 Wound Description Classification: Category/Stage IV Wound Margin: Well defined, not attached Exudate Amount: Medium Exudate Type: Serosanguineous Exudate Color: red, brown Foul Odor After Cleansing: No Slough/Fibrino Yes Wound Bed Granulation Amount: Large (67-100%) Exposed  Structure Granulation Quality: Red Fascia Exposed: Yes Necrotic Amount: None Present (0%) Fat Layer (Subcutaneous Tissue) Exposed: Yes Tendon Exposed: Yes Muscle Exposed: Yes Necrosis of Muscle: No Joint Exposed: No  Exposed: Yes Necrotic Quality: Adherent Slough Tendon Exposed: No Muscle Exposed: No Joint Exposed: No Bone Exposed: No Treatment Notes Wound #14 (Lower Leg) Wound Laterality: Right, Medial, Distal Cleanser Vashe 5.8 (oz) Discharge Instruction: damp to dry daily Peri-Wound Care Topical Primary Dressing Secondary Dressing ABD Pad 5x9 (in/in) Discharge Instruction: Cover with ABD pad Kerlix 4.5 x 4.1 (in/yd) Discharge Instruction: Apply Kerlix 4.5 x 4.1 (in/yd) as instructed Secured With ACE WRAP - 70M ACE Elastic Bandage With VELCRO Brand Closure, 4 (in) Compression Wrap Compression Stockings Add-Ons [name] [name], RODRIQUEZ [name] ([id number]) [id number] Page 12 of 17 Electronic Signature(s) Signed: 07/31/2023 5:25:51 PM By: Betha Loa Signed: 08/01/2023 1:18:15 PM By: Yevonne Pax RN Entered By: Betha Loa on 07/31/2023  11:36:39 -------------------------------------------------------------------------------- Wound Assessment Details Patient Name: Date of Service: [facility], [name] [name]. 07/31/2023 11:00 A M Medical Record Number: [id number] Patient Account Number: [id number] Date of Birth/Sex: Treating RN: [birth year] (82 y.o. [name] Primary Care Ildefonso Keaney: Barbette Reichmann Other Clinician: Betha Loa Referring Sumaya Riedesel: Treating Larina Lieurance/Extender: [name], [name], [name] [name] in Treatment: 44 Wound Status Wound Number: 15 Primary Pressure Ulcer Etiology: Wound Location: Left, Anterior Lower Leg Wound Open Wounding Event: Pressure Injury Status: Date Acquired: 04/11/2023 Comorbid Cataracts, Lymphedema, Hypertension, Peripheral Arterial Disease, [name] Of Treatment: 15 History: Peripheral Venous Disease, Type II Diabetes, Osteoarthritis, Clustered Wound: No Neuropathy Photos Wound Measurements Length: (cm) 6 Width: (cm) 3 Depth: (cm) 0.1 Area: (cm) 14.137 Volume: (cm) 1.414 % Reduction in Area: 37% % Reduction in Volume: 37% Epithelialization: None Wound Description Classification: Category/Stage IV Exudate Amount: Medium Exudate Type: Serosanguineous Exudate Color: red, brown Foul Odor After Cleansing: No Slough/Fibrino Yes Wound Bed Granulation Amount: Large (67-100%) Exposed Structure Granulation Quality: Red Fascia Exposed: No Necrotic Amount: Small (1-33%) Fat Layer (Subcutaneous Tissue) Exposed: Yes Necrotic Quality: Adherent Slough Tendon Exposed: Yes Muscle Exposed: No Joint Exposed: No Bone Exposed: No Treatment Notes [name], [name] [name] ([id number]) [id number] Page 13 of 17 Wound #15 (Lower Leg) Wound Laterality: Left, Anterior Cleanser Vashe 5.8 (oz) Discharge Instruction: damp to dry daily Peri-Wound Care Topical Primary Dressing Secondary Dressing ABD Pad 5x9 (in/in) Discharge Instruction: Cover with ABD  pad Kerlix 4.5 x 4.1 (in/yd) Discharge Instruction: Apply Kerlix 4.5 x 4.1 (in/yd) as instructed Secured With ACE WRAP - 70M ACE Elastic Bandage With VELCRO Brand Closure, 4 (in) Compression Wrap Compression Stockings Add-Ons Electronic Signature(s) Signed: 07/31/2023 5:25:51 PM By: Betha Loa Signed: 08/01/2023 1:18:15 PM By: Yevonne Pax RN Entered By: Betha Loa on 07/31/2023 11:37:19 -------------------------------------------------------------------------------- Wound Assessment Details Patient Name: Date of Service: [facility], [name] [name]. 07/31/2023 11:00 A M Medical Record Number: [id number] Patient Account Number: [id number] Date of Birth/Sex: Treating RN: [birth year] (82 y.o. [name] Primary Care Zamariya Neal: Barbette Reichmann Other Clinician: Betha Loa Referring Porcia Morganti: Treating Yanni Quiroa/Extender: [name], [name], [name] [name] in Treatment: 44 Wound Status Wound Number: 16 Primary Pressure Ulcer Etiology: Wound Location: Left Trochanter Wound Open Wounding Event: Pressure Injury Status: Date Acquired: 05/22/2023 Comorbid Cataracts, Lymphedema, Hypertension, Peripheral Arterial Disease, [name] Of Treatment: 10 History: Peripheral Venous Disease, Type II Diabetes, Osteoarthritis, Clustered Wound: No Neuropathy Photos [name], DEMPEWOLF [name] ([id number]) [id number] Page 14 of 17 Wound Measurements Length: (cm) 1.5 Width: (cm) 2 Depth: (cm) 0.8 Area: (cm) 2.356 Volume: (cm) 1.885 % Reduction in Area: 27.6% % Reduction in Volume: -480% Wound Description Classification: Unstageable/Unclassified Exudate Amount: Medium Exudate Type: Serosanguineous Exudate Color: red, brown Foul Odor After Cleansing: No Slough/Fibrino Yes Wound  [name], [name], [name] [name] in Treatment: 44 Wound Status Wound Number: 6 Primary Pressure Ulcer Etiology: Wound Location: Right Calcaneus Wound Open Wounding Event: Pressure Injury Status: Date Acquired: 07/23/2022 Comorbid Cataracts, Lymphedema, Hypertension, Peripheral Arterial Disease, [name] Of Treatment: 44 History: Peripheral Venous Disease, Type II Diabetes, Osteoarthritis, Clustered Wound: No Neuropathy Photos Wound Measurements Length: (cm) 2 Width: (cm) 2 Depth: (cm) 0.2 Area: (cm) 3.142 Volume: (cm) 0.628 % Reduction in Area: 66.7% % Reduction in Volume: 77.8% Epithelialization: None Wound Description Classification: Category/Stage III Wound Margin: Distinct, outline attached Exudate Amount: Medium Exudate Type: Serosanguineous Exudate Color: red, brown Foul Odor After Cleansing: No Slough/Fibrino Yes Wound Bed Granulation Amount: Medium (34-66%) Exposed Structure Granulation Quality: Red, Pink Fascia Exposed: No Necrotic Amount: Medium (34-66%) Fat Layer (Subcutaneous Tissue) Exposed: Yes [name], [name] [name] ([id number]) [id number] Page 17 of 17 Necrotic Quality: Eschar, Adherent Slough Tendon Exposed: No Muscle Exposed: No Joint Exposed: No Bone Exposed: No Treatment Notes Wound #6 (Calcaneus) Wound Laterality: Right Cleanser Vashe 5.8 (oz) Discharge Instruction: damp to dry daily over Santyl Peri-Wound Care Topical Primary Dressing Secondary  Dressing ABD Pad 5x9 (in/in) Discharge Instruction: Cover with ABD pad Kerlix 4.5 x 4.1 (in/yd) Discharge Instruction: Apply Kerlix 4.5 x 4.1 (in/yd) as instructed Secured With ACE WRAP - 8M ACE Elastic Bandage With VELCRO Brand Closure, 4 (in) Compression Wrap Compression Stockings Add-Ons Electronic Signature(s) Signed: 07/31/2023 5:25:51 PM By: Betha Loa Signed: 08/01/2023 1:18:15 PM By: Yevonne Pax RN Entered By: Betha Loa on 07/31/2023 11:38:28 -------------------------------------------------------------------------------- Vitals Details Patient Name: Date of Service: [facility], [name] [name]. 07/31/2023 11:00 A M Medical Record Number: [id number] Patient Account Number: [id number] Date of Birth/Sex: Treating RN: 1941/08/14 (82 y.o. [name] Primary Care Lilliane Sposito: Barbette Reichmann Other Clinician: Betha Loa Referring Jamaar Howes: Treating Mahli Glahn/Extender: [name], [name], [name] [name] in Treatment: 44 Vital Signs Time Taken: 11:11 Temperature (F): 97.7 Weight (lbs): 98 Pulse (bpm): 77 Respiratory Rate (breaths/min): 18 Blood Pressure (mmHg): 107/61 Reference Range: 80 - 120 mg / dl Electronic Signature(s) Signed: 07/31/2023 5:25:51 PM By: Betha Loa Entered By: Betha Loa on 07/31/2023 11:12:54  Exposed: Yes Necrotic Quality: Adherent Slough Tendon Exposed: No Muscle Exposed: No Joint Exposed: No Bone Exposed: No Treatment Notes Wound #14 (Lower Leg) Wound Laterality: Right, Medial, Distal Cleanser Vashe 5.8 (oz) Discharge Instruction: damp to dry daily Peri-Wound Care Topical Primary Dressing Secondary Dressing ABD Pad 5x9 (in/in) Discharge Instruction: Cover with ABD pad Kerlix 4.5 x 4.1 (in/yd) Discharge Instruction: Apply Kerlix 4.5 x 4.1 (in/yd) as instructed Secured With ACE WRAP - 70M ACE Elastic Bandage With VELCRO Brand Closure, 4 (in) Compression Wrap Compression Stockings Add-Ons [name] [name], RODRIQUEZ [name] ([id number]) [id number] Page 12 of 17 Electronic Signature(s) Signed: 07/31/2023 5:25:51 PM By: Betha Loa Signed: 08/01/2023 1:18:15 PM By: Yevonne Pax RN Entered By: Betha Loa on 07/31/2023  11:36:39 -------------------------------------------------------------------------------- Wound Assessment Details Patient Name: Date of Service: [facility], [name] [name]. 07/31/2023 11:00 A M Medical Record Number: [id number] Patient Account Number: [id number] Date of Birth/Sex: Treating RN: [birth year] (82 y.o. [name] Primary Care Ildefonso Keaney: Barbette Reichmann Other Clinician: Betha Loa Referring Sumaya Riedesel: Treating Larina Lieurance/Extender: [name], [name], [name] [name] in Treatment: 44 Wound Status Wound Number: 15 Primary Pressure Ulcer Etiology: Wound Location: Left, Anterior Lower Leg Wound Open Wounding Event: Pressure Injury Status: Date Acquired: 04/11/2023 Comorbid Cataracts, Lymphedema, Hypertension, Peripheral Arterial Disease, [name] Of Treatment: 15 History: Peripheral Venous Disease, Type II Diabetes, Osteoarthritis, Clustered Wound: No Neuropathy Photos Wound Measurements Length: (cm) 6 Width: (cm) 3 Depth: (cm) 0.1 Area: (cm) 14.137 Volume: (cm) 1.414 % Reduction in Area: 37% % Reduction in Volume: 37% Epithelialization: None Wound Description Classification: Category/Stage IV Exudate Amount: Medium Exudate Type: Serosanguineous Exudate Color: red, brown Foul Odor After Cleansing: No Slough/Fibrino Yes Wound Bed Granulation Amount: Large (67-100%) Exposed Structure Granulation Quality: Red Fascia Exposed: No Necrotic Amount: Small (1-33%) Fat Layer (Subcutaneous Tissue) Exposed: Yes Necrotic Quality: Adherent Slough Tendon Exposed: Yes Muscle Exposed: No Joint Exposed: No Bone Exposed: No Treatment Notes [name], [name] [name] ([id number]) [id number] Page 13 of 17 Wound #15 (Lower Leg) Wound Laterality: Left, Anterior Cleanser Vashe 5.8 (oz) Discharge Instruction: damp to dry daily Peri-Wound Care Topical Primary Dressing Secondary Dressing ABD Pad 5x9 (in/in) Discharge Instruction: Cover with ABD  pad Kerlix 4.5 x 4.1 (in/yd) Discharge Instruction: Apply Kerlix 4.5 x 4.1 (in/yd) as instructed Secured With ACE WRAP - 70M ACE Elastic Bandage With VELCRO Brand Closure, 4 (in) Compression Wrap Compression Stockings Add-Ons Electronic Signature(s) Signed: 07/31/2023 5:25:51 PM By: Betha Loa Signed: 08/01/2023 1:18:15 PM By: Yevonne Pax RN Entered By: Betha Loa on 07/31/2023 11:37:19 -------------------------------------------------------------------------------- Wound Assessment Details Patient Name: Date of Service: [facility], [name] [name]. 07/31/2023 11:00 A M Medical Record Number: [id number] Patient Account Number: [id number] Date of Birth/Sex: Treating RN: [birth year] (82 y.o. [name] Primary Care Zamariya Neal: Barbette Reichmann Other Clinician: Betha Loa Referring Porcia Morganti: Treating Yanni Quiroa/Extender: [name], [name], [name] [name] in Treatment: 44 Wound Status Wound Number: 16 Primary Pressure Ulcer Etiology: Wound Location: Left Trochanter Wound Open Wounding Event: Pressure Injury Status: Date Acquired: 05/22/2023 Comorbid Cataracts, Lymphedema, Hypertension, Peripheral Arterial Disease, [name] Of Treatment: 10 History: Peripheral Venous Disease, Type II Diabetes, Osteoarthritis, Clustered Wound: No Neuropathy Photos [name], DEMPEWOLF [name] ([id number]) [id number] Page 14 of 17 Wound Measurements Length: (cm) 1.5 Width: (cm) 2 Depth: (cm) 0.8 Area: (cm) 2.356 Volume: (cm) 1.885 % Reduction in Area: 27.6% % Reduction in Volume: -480% Wound Description Classification: Unstageable/Unclassified Exudate Amount: Medium Exudate Type: Serosanguineous Exudate Color: red, brown Foul Odor After Cleansing: No Slough/Fibrino Yes Wound

## 2023-08-01 NOTE — Progress Notes (Signed)
Details Patient Name: Date of Service: [facility], [name] 07/31/2023 11:00 A M Medical Record Number: [id number] Patient Account Number: [id number] Date of Birth/Sex: Treating RN: 1941-09-25 (82 y.o. [name] Primary Care Provider: Barbette Reichmann Other Clinician: Betha Loa Referring Provider: Treating Provider/Extender: [name], [name], Vishwanath Weeks in Treatment: 85 Verbal / Phone Orders: Yes Clinician: Yevonne Pax Read Back and Verified: Yes Diagnosis Coding Follow-up Appointments Return Appointment in 2 weeks. Home Health Home Health Company: - Harlin Rain, RN [phone] [facility] for wound care. May utilize formulary equivalent dressing for wound treatment orders unless otherwise specified. Home Health Nurse may visit PRN to address patients wound care needs. - left trochanter, santyl daily all other  wounds- vashe damp tp dry daily   Off-LoadingLow air-loss mattress (Group 2) Wound Treatment Wound #11 - Trochanter Wound Laterality: Right Cleanser: Vashe 5.8 (oz) (Home Health) 1 x Per Day/30 Days Discharge Instructions: damp to dry daily Secondary Dressing: (BORDER) Zetuvit Plus SILICONE BORDER Dressing 5x5 (in/in) 1 x Per Day/30 Days Discharge Instructions: Please do not put silicone bordered dressings under wraps. Use non-bordered dressing only. Wound #13 - Calcaneus Wound Laterality: Left Cleanser: Vashe 5.8 (oz) (Home Health) 1 x Per Day/30 Days Discharge Instructions: damp to dry daily Secondary Dressing: ABD Pad 5x9 (in/in) 1 x Per Day/30 Days Discharge Instructions: Cover with ABD pad Secondary Dressing: Kerlix 4.5 x 4.1 (in/yd) 1 x Per Day/30 Days Discharge Instructions: Apply Kerlix 4.5 x 4.1 (in/yd) as instructed Secured With: ACE WRAP - 422M ACE Elastic Bandage With VELCRO Brand Closure, 4 (in) 1 x Per Day/30 Days Wound #14 - Lower Leg Wound Laterality: Right, Medial, Distal Cleanser: Vashe 5.8 (oz) (Home Health) 1 x Per Day/30 Days Discharge Instructions: damp to dry daily Secondary Dressing: ABD Pad 5x9 (in/in) 1 x Per Day/30 Days Discharge Instructions: Cover with ABD pad Magouirk, Maykayla L ([id number]) [id number] Page 5 of 12 Secondary Dressing: Kerlix 4.5 x 4.1 (in/yd) 1 x Per Day/30 Days Discharge Instructions: Apply Kerlix 4.5 x 4.1 (in/yd) as instructed Secured With: ACE WRAP - 422M ACE Elastic Bandage With VELCRO Brand Closure, 4 (in) 1 x Per Day/30 Days Wound #15 - Lower Leg Wound Laterality: Left, Anterior Cleanser: Vashe 5.8 (oz) (Home Health) 1 x Per Day/30 Days Discharge Instructions: damp to dry daily Secondary Dressing: ABD Pad 5x9 (in/in) 1 x Per Day/30 Days Discharge Instructions: Cover with ABD pad Secondary Dressing: Kerlix 4.5 x 4.1 (in/yd) 1 x Per Day/30 Days Discharge Instructions: Apply Kerlix 4.5 x 4.1 (in/yd) as  instructed Secured With: ACE WRAP - 422M ACE Elastic Bandage With VELCRO Brand Closure, 4 (in) 1 x Per Day/30 Days Wound #16 - Trochanter Wound Laterality: Left Cleanser: Normal Saline (Home Health) 1 x Per Day/30 Days Discharge Instructions: Wash your hands with soap and water. Remove old dressing, discard into plastic bag and place into trash. Cleanse the wound with Normal Saline prior to applying a clean dressing using gauze sponges, not tissues or cotton balls. Do not scrub or use excessive force. Pat dry using gauze sponges, not tissue or cotton balls. Topical: Santyl Collagenase Ointment, 30 (gm), tube (Home Health) 1 x Per Day/30 Days Discharge Instructions: apply nickel thick to wound bed only Prim Dressing: Gauze 1 x Per Day/30 Days ary Discharge Instructions: Vashe damp to dry over Santyl Secondary Dressing: (BORDER) Zetuvit Plus SILICONE BORDER Dressing 4x4 (in/in) (Home Health) 1 x Per Day/30 Days Discharge Instructions: Please do not put silicone  bordered dressings under wraps. Use non-bordered dressing only. Wound #18 - Metatarsal head first Wound Laterality: Right Cleanser: Normal Saline (Home Health) 1 x Per Day/30 Days Discharge Instructions: Wash your hands with soap and water. Remove old dressing, discard into plastic bag and place into trash. Cleanse the wound with Normal Saline prior to applying a clean dressing using gauze sponges, not tissues or cotton balls. Do not scrub or use excessive force. Pat dry using gauze sponges, not tissue or cotton balls. Prim Dressing: Gauze 1 x Per Day/30 Days ary Discharge Instructions: Vashe damp to dry Secondary Dressing: (BORDER) Zetuvit Plus SILICONE BORDER Dressing 4x4 (in/in) (Home Health) 1 x Per Day/30 Days Discharge Instructions: Please do not put silicone bordered dressings under wraps. Use non-bordered dressing only. Wound #6 - Calcaneus Wound Laterality: Right Cleanser: Vashe 5.8 (oz) (Home Health) 1 x Per Day/30  Days Discharge Instructions: damp to dry daily over Santyl Secondary Dressing: ABD Pad 5x9 (in/in) 1 x Per Day/30 Days Discharge Instructions: Cover with ABD pad Secondary Dressing: Kerlix 4.5 x 4.1 (in/yd) 1 x Per Day/30 Days Discharge Instructions: Apply Kerlix 4.5 x 4.1 (in/yd) as instructed Secured With: ACE WRAP - 16M ACE Elastic Bandage With VELCRO Brand Closure, 4 (in) 1 x Per Day/30 Days Electronic Signature(s) Signed: 07/31/2023 5:02:29 PM By: Baltazar Najjar MD Signed: 07/31/2023 5:25:51 PM By: Betha Loa Entered By: Betha Loa on 07/31/2023 09:17:30 Wantz, [name] ([id number]) [id number] Page 6 of 12 -------------------------------------------------------------------------------- Problem List Details Patient Name: Date of Service: [facility], [name] 07/31/2023 11:00 A M Medical Record Number: [id number] Patient Account Number: [id number] Date of Birth/Sex: Treating RN: 06-07-1941 (82 y.o. [name] Primary Care Provider: Barbette Reichmann Other Clinician: Betha Loa Referring Provider: Treating Provider/Extender: [name], [name], Vishwanath Weeks in Treatment: 47 Active Problems ICD-10 Encounter Code Description Active Date MDM Diagnosis L97.822 Non-pressure chronic ulcer of other part of left lower leg with fat layer 09/26/2022 No Yes exposed L97.812 Non-pressure chronic ulcer of other part of right lower leg with fat layer 03/13/2023 No Yes exposed I87.312 Chronic venous hypertension (idiopathic) with ulcer of left lower extremity 09/26/2022 No Yes I89.0 Lymphedema, not elsewhere classified 09/26/2022 No Yes E11.622 Type 2 diabetes mellitus with other skin ulcer 09/26/2022 No Yes L89.610 Pressure ulcer of right heel, unstageable 09/26/2022 No Yes L89.513 Pressure ulcer of right ankle, stage 3 12/05/2022 No Yes L89.620 Pressure ulcer of left heel, unstageable 12/05/2022 No Yes M86.172 Other acute  osteomyelitis, left ankle and foot 10/24/2022 No Yes L89.214 Pressure ulcer of right hip, stage 4 11/21/2022 No Yes L89.220 Pressure ulcer of left hip, unstageable 05/22/2023 No Yes I50.32 Chronic diastolic (congestive) heart failure 09/26/2022 No Yes Granito, Erick L ([id number]) [id number] Page 7 of 12 Z79.84 Long term (current) use of oral hypoglycemic drugs 03/13/2023 No Yes Inactive Problems ICD-10 Code Description Active Date Inactive Date S81.802A Unspecified open wound, left lower leg, initial encounter 05/22/2023 05/22/2023 Resolved Problems ICD-10 Code Description Active Date Resolved Date L89.320 Pressure ulcer of left buttock, unstageable 09/26/2022 09/26/2022 S91.302A Unspecified open wound, left foot, initial encounter 10/24/2022 10/24/2022 Electronic Signature(s) Signed: 07/31/2023 5:02:29 PM By: Baltazar Najjar MD Entered By: Baltazar Najjar on 07/31/2023 09:12:12 -------------------------------------------------------------------------------- Progress Note Details Patient Name: Date of Service: [facility], [name] 07/31/2023 11:00 A M Medical Record Number: [id number] Patient Account Number: [id number] Date of Birth/Sex: Treating RN: Jun 06, 1941 (82 y.o. [name] Primary Care Provider: Barbette Reichmann Other Clinician: Betha Loa Referring Provider: Treating  bordered dressings under wraps. Use non-bordered dressing only. Wound #18 - Metatarsal head first Wound Laterality: Right Cleanser: Normal Saline (Home Health) 1 x Per Day/30 Days Discharge Instructions: Wash your hands with soap and water. Remove old dressing, discard into plastic bag and place into trash. Cleanse the wound with Normal Saline prior to applying a clean dressing using gauze sponges, not tissues or cotton balls. Do not scrub or use excessive force. Pat dry using gauze sponges, not tissue or cotton balls. Prim Dressing: Gauze 1 x Per Day/30 Days ary Discharge Instructions: Vashe damp to dry Secondary Dressing: (BORDER) Zetuvit Plus SILICONE BORDER Dressing 4x4 (in/in) (Home Health) 1 x Per Day/30 Days Discharge Instructions: Please do not put silicone bordered dressings under wraps. Use non-bordered dressing only. Wound #6 - Calcaneus Wound Laterality: Right Cleanser: Vashe 5.8 (oz) (Home Health) 1 x Per Day/30  Days Discharge Instructions: damp to dry daily over Santyl Secondary Dressing: ABD Pad 5x9 (in/in) 1 x Per Day/30 Days Discharge Instructions: Cover with ABD pad Secondary Dressing: Kerlix 4.5 x 4.1 (in/yd) 1 x Per Day/30 Days Discharge Instructions: Apply Kerlix 4.5 x 4.1 (in/yd) as instructed Secured With: ACE WRAP - 16M ACE Elastic Bandage With VELCRO Brand Closure, 4 (in) 1 x Per Day/30 Days Electronic Signature(s) Signed: 07/31/2023 5:02:29 PM By: Baltazar Najjar MD Signed: 07/31/2023 5:25:51 PM By: Betha Loa Entered By: Betha Loa on 07/31/2023 09:17:30 Wantz, [name] ([id number]) [id number] Page 6 of 12 -------------------------------------------------------------------------------- Problem List Details Patient Name: Date of Service: [facility], [name] 07/31/2023 11:00 A M Medical Record Number: [id number] Patient Account Number: [id number] Date of Birth/Sex: Treating RN: 06-07-1941 (82 y.o. [name] Primary Care Provider: Barbette Reichmann Other Clinician: Betha Loa Referring Provider: Treating Provider/Extender: [name], [name], Vishwanath Weeks in Treatment: 47 Active Problems ICD-10 Encounter Code Description Active Date MDM Diagnosis L97.822 Non-pressure chronic ulcer of other part of left lower leg with fat layer 09/26/2022 No Yes exposed L97.812 Non-pressure chronic ulcer of other part of right lower leg with fat layer 03/13/2023 No Yes exposed I87.312 Chronic venous hypertension (idiopathic) with ulcer of left lower extremity 09/26/2022 No Yes I89.0 Lymphedema, not elsewhere classified 09/26/2022 No Yes E11.622 Type 2 diabetes mellitus with other skin ulcer 09/26/2022 No Yes L89.610 Pressure ulcer of right heel, unstageable 09/26/2022 No Yes L89.513 Pressure ulcer of right ankle, stage 3 12/05/2022 No Yes L89.620 Pressure ulcer of left heel, unstageable 12/05/2022 No Yes M86.172 Other acute  osteomyelitis, left ankle and foot 10/24/2022 No Yes L89.214 Pressure ulcer of right hip, stage 4 11/21/2022 No Yes L89.220 Pressure ulcer of left hip, unstageable 05/22/2023 No Yes I50.32 Chronic diastolic (congestive) heart failure 09/26/2022 No Yes Granito, Erick L ([id number]) [id number] Page 7 of 12 Z79.84 Long term (current) use of oral hypoglycemic drugs 03/13/2023 No Yes Inactive Problems ICD-10 Code Description Active Date Inactive Date S81.802A Unspecified open wound, left lower leg, initial encounter 05/22/2023 05/22/2023 Resolved Problems ICD-10 Code Description Active Date Resolved Date L89.320 Pressure ulcer of left buttock, unstageable 09/26/2022 09/26/2022 S91.302A Unspecified open wound, left foot, initial encounter 10/24/2022 10/24/2022 Electronic Signature(s) Signed: 07/31/2023 5:02:29 PM By: Baltazar Najjar MD Entered By: Baltazar Najjar on 07/31/2023 09:12:12 -------------------------------------------------------------------------------- Progress Note Details Patient Name: Date of Service: [facility], [name] 07/31/2023 11:00 A M Medical Record Number: [id number] Patient Account Number: [id number] Date of Birth/Sex: Treating RN: Jun 06, 1941 (82 y.o. [name] Primary Care Provider: Barbette Reichmann Other Clinician: Betha Loa Referring Provider: Treating  bordered dressings under wraps. Use non-bordered dressing only. Wound #18 - Metatarsal head first Wound Laterality: Right Cleanser: Normal Saline (Home Health) 1 x Per Day/30 Days Discharge Instructions: Wash your hands with soap and water. Remove old dressing, discard into plastic bag and place into trash. Cleanse the wound with Normal Saline prior to applying a clean dressing using gauze sponges, not tissues or cotton balls. Do not scrub or use excessive force. Pat dry using gauze sponges, not tissue or cotton balls. Prim Dressing: Gauze 1 x Per Day/30 Days ary Discharge Instructions: Vashe damp to dry Secondary Dressing: (BORDER) Zetuvit Plus SILICONE BORDER Dressing 4x4 (in/in) (Home Health) 1 x Per Day/30 Days Discharge Instructions: Please do not put silicone bordered dressings under wraps. Use non-bordered dressing only. Wound #6 - Calcaneus Wound Laterality: Right Cleanser: Vashe 5.8 (oz) (Home Health) 1 x Per Day/30  Days Discharge Instructions: damp to dry daily over Santyl Secondary Dressing: ABD Pad 5x9 (in/in) 1 x Per Day/30 Days Discharge Instructions: Cover with ABD pad Secondary Dressing: Kerlix 4.5 x 4.1 (in/yd) 1 x Per Day/30 Days Discharge Instructions: Apply Kerlix 4.5 x 4.1 (in/yd) as instructed Secured With: ACE WRAP - 16M ACE Elastic Bandage With VELCRO Brand Closure, 4 (in) 1 x Per Day/30 Days Electronic Signature(s) Signed: 07/31/2023 5:02:29 PM By: Baltazar Najjar MD Signed: 07/31/2023 5:25:51 PM By: Betha Loa Entered By: Betha Loa on 07/31/2023 09:17:30 Wantz, [name] ([id number]) [id number] Page 6 of 12 -------------------------------------------------------------------------------- Problem List Details Patient Name: Date of Service: [facility], [name] 07/31/2023 11:00 A M Medical Record Number: [id number] Patient Account Number: [id number] Date of Birth/Sex: Treating RN: 06-07-1941 (82 y.o. [name] Primary Care Provider: Barbette Reichmann Other Clinician: Betha Loa Referring Provider: Treating Provider/Extender: [name], [name], Vishwanath Weeks in Treatment: 47 Active Problems ICD-10 Encounter Code Description Active Date MDM Diagnosis L97.822 Non-pressure chronic ulcer of other part of left lower leg with fat layer 09/26/2022 No Yes exposed L97.812 Non-pressure chronic ulcer of other part of right lower leg with fat layer 03/13/2023 No Yes exposed I87.312 Chronic venous hypertension (idiopathic) with ulcer of left lower extremity 09/26/2022 No Yes I89.0 Lymphedema, not elsewhere classified 09/26/2022 No Yes E11.622 Type 2 diabetes mellitus with other skin ulcer 09/26/2022 No Yes L89.610 Pressure ulcer of right heel, unstageable 09/26/2022 No Yes L89.513 Pressure ulcer of right ankle, stage 3 12/05/2022 No Yes L89.620 Pressure ulcer of left heel, unstageable 12/05/2022 No Yes M86.172 Other acute  osteomyelitis, left ankle and foot 10/24/2022 No Yes L89.214 Pressure ulcer of right hip, stage 4 11/21/2022 No Yes L89.220 Pressure ulcer of left hip, unstageable 05/22/2023 No Yes I50.32 Chronic diastolic (congestive) heart failure 09/26/2022 No Yes Granito, Erick L ([id number]) [id number] Page 7 of 12 Z79.84 Long term (current) use of oral hypoglycemic drugs 03/13/2023 No Yes Inactive Problems ICD-10 Code Description Active Date Inactive Date S81.802A Unspecified open wound, left lower leg, initial encounter 05/22/2023 05/22/2023 Resolved Problems ICD-10 Code Description Active Date Resolved Date L89.320 Pressure ulcer of left buttock, unstageable 09/26/2022 09/26/2022 S91.302A Unspecified open wound, left foot, initial encounter 10/24/2022 10/24/2022 Electronic Signature(s) Signed: 07/31/2023 5:02:29 PM By: Baltazar Najjar MD Entered By: Baltazar Najjar on 07/31/2023 09:12:12 -------------------------------------------------------------------------------- Progress Note Details Patient Name: Date of Service: [facility], [name] 07/31/2023 11:00 A M Medical Record Number: [id number] Patient Account Number: [id number] Date of Birth/Sex: Treating RN: Jun 06, 1941 (82 y.o. [name] Primary Care Provider: Barbette Reichmann Other Clinician: Betha Loa Referring Provider: Treating  visit I recommended she go to the ED for potential admission, IV antibiotics and further imaging. She had MRI of the pelvis and left foot that showed osteomyelitis. Podiatry advised conservative management. She was also seen by infectious disease who discharged patient on 2 weeks of linezolid and 2 weeks of ciprofloxacin. Patient is at home and has home health. Palliative care was consulted during the stay and she is full code and has declined further outpatient palliative care and hospice services.. She currently denies systemic signs of infection. Overall there is improvement in the appearance of the wounds. There is healthier granulation tissue present. However this is overall poor prognosis case. She has Prevalon boots but it is unclear if she is using these. She does not have them on today. She follows up with infectious disease next week. 8/7; Patient's been using Vashe wet-to-dry dressing to all the wound beds except for the left hip she has been using  Santyl. She followed up with infectious disease on 8/1. Currently she is on linezolid and ciprofloxacin. They recommended lab work to see if she needs extension of her oral antibiotics. She currently denies systemic signs of infection. 8/21; patient presents for follow-up. She has been using Vashe wet-to-dry dressings to all wounds except the left hip she has been using Santyl. All wounds have improved in size and appearance. She has healthy granulation tissue present to all wounds except the left hip still has nonviable tissue throughout. She states she has completed her oral antibiotics per ID. She currently denies systemic signs of infection. 9/4; patient presents for follow-up. She has been using Vashe wet-to-dry dressings to all wounds except the left hip. T the left hip she has been using Santyl. o All wounds appear well-healing. She currently denies systemic signs of infection. 9/18; 2-week follow-up. This is a very disabled woman who is nonambulatory. 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 She has significant bilateral lower extremity pressure ulcers bilaterally. We have been using Vashe wet-to-dry to most of these except Santyl in the left hip wound Electronic Signature(s) Signed: 07/31/2023 5:02:29 PM By: Baltazar Najjar MD Entered By: Baltazar Najjar on 07/31/2023 09:14:29 -------------------------------------------------------------------------------- Physical Exam Details Patient Name: Date of Service: [facility], [name] 07/31/2023 11:00 A M Medical Record Number: [id number] Patient Account Number: [id number] Date of Birth/Sex: Treating RN: 02-25-1941 (82 y.o. [name] Primary Care Provider: Barbette Reichmann Other Clinician: Betha Loa Referring Provider: Treating Provider/Extender: [name], [name], Vishwanath Weeks in Treatment: 44 Constitutional Sitting or standing Blood Pressure is within target range for  patient.. Pulse regular and within target range for patient.Marland Kitchen Respirations regular, non-labored and within target range.Marland Kitchen appears in no distress. . Frail woman speech is clear.. Notes Wound exam ELIANY, HEMPFLING L ([id number]) [id number] Page 4 of 12 The patient has substantial wounds on the right medial first metatarsal head right calcaneus and right medial ankle. Apparently the first metatarsal head wound is new. On the distal left leg she has an area anteriorly as well as the left heel. None of these look particularly ominous so far. 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 At some point this will likely need to be debrided. Electronic Signature(s) Signed: 07/31/2023 5:02:29 PM By: Baltazar Najjar MD Entered By: Baltazar Najjar on 07/31/2023 09:17:43 -------------------------------------------------------------------------------- Physician Orders  bordered dressings under wraps. Use non-bordered dressing only. Wound #18 - Metatarsal head first Wound Laterality: Right Cleanser: Normal Saline (Home Health) 1 x Per Day/30 Days Discharge Instructions: Wash your hands with soap and water. Remove old dressing, discard into plastic bag and place into trash. Cleanse the wound with Normal Saline prior to applying a clean dressing using gauze sponges, not tissues or cotton balls. Do not scrub or use excessive force. Pat dry using gauze sponges, not tissue or cotton balls. Prim Dressing: Gauze 1 x Per Day/30 Days ary Discharge Instructions: Vashe damp to dry Secondary Dressing: (BORDER) Zetuvit Plus SILICONE BORDER Dressing 4x4 (in/in) (Home Health) 1 x Per Day/30 Days Discharge Instructions: Please do not put silicone bordered dressings under wraps. Use non-bordered dressing only. Wound #6 - Calcaneus Wound Laterality: Right Cleanser: Vashe 5.8 (oz) (Home Health) 1 x Per Day/30  Days Discharge Instructions: damp to dry daily over Santyl Secondary Dressing: ABD Pad 5x9 (in/in) 1 x Per Day/30 Days Discharge Instructions: Cover with ABD pad Secondary Dressing: Kerlix 4.5 x 4.1 (in/yd) 1 x Per Day/30 Days Discharge Instructions: Apply Kerlix 4.5 x 4.1 (in/yd) as instructed Secured With: ACE WRAP - 16M ACE Elastic Bandage With VELCRO Brand Closure, 4 (in) 1 x Per Day/30 Days Electronic Signature(s) Signed: 07/31/2023 5:02:29 PM By: Baltazar Najjar MD Signed: 07/31/2023 5:25:51 PM By: Betha Loa Entered By: Betha Loa on 07/31/2023 09:17:30 Wantz, [name] ([id number]) [id number] Page 6 of 12 -------------------------------------------------------------------------------- Problem List Details Patient Name: Date of Service: [facility], [name] 07/31/2023 11:00 A M Medical Record Number: [id number] Patient Account Number: [id number] Date of Birth/Sex: Treating RN: 06-07-1941 (82 y.o. [name] Primary Care Provider: Barbette Reichmann Other Clinician: Betha Loa Referring Provider: Treating Provider/Extender: [name], [name], Vishwanath Weeks in Treatment: 47 Active Problems ICD-10 Encounter Code Description Active Date MDM Diagnosis L97.822 Non-pressure chronic ulcer of other part of left lower leg with fat layer 09/26/2022 No Yes exposed L97.812 Non-pressure chronic ulcer of other part of right lower leg with fat layer 03/13/2023 No Yes exposed I87.312 Chronic venous hypertension (idiopathic) with ulcer of left lower extremity 09/26/2022 No Yes I89.0 Lymphedema, not elsewhere classified 09/26/2022 No Yes E11.622 Type 2 diabetes mellitus with other skin ulcer 09/26/2022 No Yes L89.610 Pressure ulcer of right heel, unstageable 09/26/2022 No Yes L89.513 Pressure ulcer of right ankle, stage 3 12/05/2022 No Yes L89.620 Pressure ulcer of left heel, unstageable 12/05/2022 No Yes M86.172 Other acute  osteomyelitis, left ankle and foot 10/24/2022 No Yes L89.214 Pressure ulcer of right hip, stage 4 11/21/2022 No Yes L89.220 Pressure ulcer of left hip, unstageable 05/22/2023 No Yes I50.32 Chronic diastolic (congestive) heart failure 09/26/2022 No Yes Granito, Erick L ([id number]) [id number] Page 7 of 12 Z79.84 Long term (current) use of oral hypoglycemic drugs 03/13/2023 No Yes Inactive Problems ICD-10 Code Description Active Date Inactive Date S81.802A Unspecified open wound, left lower leg, initial encounter 05/22/2023 05/22/2023 Resolved Problems ICD-10 Code Description Active Date Resolved Date L89.320 Pressure ulcer of left buttock, unstageable 09/26/2022 09/26/2022 S91.302A Unspecified open wound, left foot, initial encounter 10/24/2022 10/24/2022 Electronic Signature(s) Signed: 07/31/2023 5:02:29 PM By: Baltazar Najjar MD Entered By: Baltazar Najjar on 07/31/2023 09:12:12 -------------------------------------------------------------------------------- Progress Note Details Patient Name: Date of Service: [facility], [name] 07/31/2023 11:00 A M Medical Record Number: [id number] Patient Account Number: [id number] Date of Birth/Sex: Treating RN: Jun 06, 1941 (82 y.o. [name] Primary Care Provider: Barbette Reichmann Other Clinician: Betha Loa Referring Provider: Treating  Details Patient Name: Date of Service: [facility], [name] 07/31/2023 11:00 A M Medical Record Number: [id number] Patient Account Number: [id number] Date of Birth/Sex: Treating RN: 1941-09-25 (82 y.o. [name] Primary Care Provider: Barbette Reichmann Other Clinician: Betha Loa Referring Provider: Treating Provider/Extender: [name], [name], Vishwanath Weeks in Treatment: 85 Verbal / Phone Orders: Yes Clinician: Yevonne Pax Read Back and Verified: Yes Diagnosis Coding Follow-up Appointments Return Appointment in 2 weeks. Home Health Home Health Company: - Harlin Rain, RN [phone] [facility] for wound care. May utilize formulary equivalent dressing for wound treatment orders unless otherwise specified. Home Health Nurse may visit PRN to address patients wound care needs. - left trochanter, santyl daily all other  wounds- vashe damp tp dry daily   Off-LoadingLow air-loss mattress (Group 2) Wound Treatment Wound #11 - Trochanter Wound Laterality: Right Cleanser: Vashe 5.8 (oz) (Home Health) 1 x Per Day/30 Days Discharge Instructions: damp to dry daily Secondary Dressing: (BORDER) Zetuvit Plus SILICONE BORDER Dressing 5x5 (in/in) 1 x Per Day/30 Days Discharge Instructions: Please do not put silicone bordered dressings under wraps. Use non-bordered dressing only. Wound #13 - Calcaneus Wound Laterality: Left Cleanser: Vashe 5.8 (oz) (Home Health) 1 x Per Day/30 Days Discharge Instructions: damp to dry daily Secondary Dressing: ABD Pad 5x9 (in/in) 1 x Per Day/30 Days Discharge Instructions: Cover with ABD pad Secondary Dressing: Kerlix 4.5 x 4.1 (in/yd) 1 x Per Day/30 Days Discharge Instructions: Apply Kerlix 4.5 x 4.1 (in/yd) as instructed Secured With: ACE WRAP - 422M ACE Elastic Bandage With VELCRO Brand Closure, 4 (in) 1 x Per Day/30 Days Wound #14 - Lower Leg Wound Laterality: Right, Medial, Distal Cleanser: Vashe 5.8 (oz) (Home Health) 1 x Per Day/30 Days Discharge Instructions: damp to dry daily Secondary Dressing: ABD Pad 5x9 (in/in) 1 x Per Day/30 Days Discharge Instructions: Cover with ABD pad Magouirk, Maykayla L ([id number]) [id number] Page 5 of 12 Secondary Dressing: Kerlix 4.5 x 4.1 (in/yd) 1 x Per Day/30 Days Discharge Instructions: Apply Kerlix 4.5 x 4.1 (in/yd) as instructed Secured With: ACE WRAP - 422M ACE Elastic Bandage With VELCRO Brand Closure, 4 (in) 1 x Per Day/30 Days Wound #15 - Lower Leg Wound Laterality: Left, Anterior Cleanser: Vashe 5.8 (oz) (Home Health) 1 x Per Day/30 Days Discharge Instructions: damp to dry daily Secondary Dressing: ABD Pad 5x9 (in/in) 1 x Per Day/30 Days Discharge Instructions: Cover with ABD pad Secondary Dressing: Kerlix 4.5 x 4.1 (in/yd) 1 x Per Day/30 Days Discharge Instructions: Apply Kerlix 4.5 x 4.1 (in/yd) as  instructed Secured With: ACE WRAP - 422M ACE Elastic Bandage With VELCRO Brand Closure, 4 (in) 1 x Per Day/30 Days Wound #16 - Trochanter Wound Laterality: Left Cleanser: Normal Saline (Home Health) 1 x Per Day/30 Days Discharge Instructions: Wash your hands with soap and water. Remove old dressing, discard into plastic bag and place into trash. Cleanse the wound with Normal Saline prior to applying a clean dressing using gauze sponges, not tissues or cotton balls. Do not scrub or use excessive force. Pat dry using gauze sponges, not tissue or cotton balls. Topical: Santyl Collagenase Ointment, 30 (gm), tube (Home Health) 1 x Per Day/30 Days Discharge Instructions: apply nickel thick to wound bed only Prim Dressing: Gauze 1 x Per Day/30 Days ary Discharge Instructions: Vashe damp to dry over Santyl Secondary Dressing: (BORDER) Zetuvit Plus SILICONE BORDER Dressing 4x4 (in/in) (Home Health) 1 x Per Day/30 Days Discharge Instructions: Please do not put silicone  [name], [name] ([id number]) [id number] Page 1 of 12 Visit Report for 07/31/2023 Debridement Details Patient Name: Date of Service: [facility], [name] 07/31/2023 11:00 A M Medical Record Number: [id number] Patient Account Number: [id number] Date of Birth/Sex: Treating RN: 07/24/1941 (82 y.o. [name] Primary Care Provider: Barbette Reichmann Other Clinician: Betha Loa Referring Provider: Treating Provider/Extender: [name], [name], Vishwanath Weeks in Treatment: 44 Debridement Performed for Assessment: Wound #16 Left Trochanter Performed By: Physician Maxwell Caul, MD Debridement Type: Chemical/Enzymatic/Mechanical Agent Used: Santyl Level of Consciousness (Pre-procedure): Awake and Alert Pre-procedure Verification/Time Out Yes - 12:05 Taken: Start Time: 12:05 Percent of Wound Bed Debrided: Instrument: Curette Bleeding: None Response to Treatment: Procedure was tolerated well Level of Consciousness (Post- Awake and Alert procedure): Post Debridement Measurements of Total Wound Length: (cm) 1.5 Stage: Category/Stage IV Width: (cm) 2 Depth: (cm) 0.8 Volume: (cm) 1.885 Character of Wound/Ulcer Post Debridement: Stable Post Procedure Diagnosis Same as Pre-procedure Electronic Signature(s) Signed: 07/31/2023 5:02:29 PM By: Baltazar Najjar MD Signed: 07/31/2023 5:25:51 PM By: Betha Loa Signed: 08/01/2023 1:18:15 PM By: Yevonne Pax RN Entered By: Betha Loa on 07/31/2023 09:17:16 -------------------------------------------------------------------------------- HPI Details Patient Name: Date of Service: [facility], [name] 07/31/2023 11:00 A M Medical Record Number: [id number] Patient Account Number: [id number] Date of Birth/Sex: Treating RN: December 17, 1940 (82 y.o. [name] Primary Care Provider: Barbette Reichmann Other Clinician: Betha Loa Referring Provider: Treating Provider/Extender: [address]  MICHA EL Allegra Grana [place], Milaina L ([id number]) [id number] Page 2 of 12 Weeks in Treatment: 21 History of Present Illness HPI Description: 82 year old patient who comes with a referral for bilateral lower extremity edema and a lower extremity ulceration and has been sent by her PCP [name]. I understand the patient was recently put on amoxicillin and doxycycline but could not tolerate the amoxicillin. doxycycline course was completed. a BNP and EKG was supposed to be normal and the patient did not have any dyspnea. the patient has been on a diuretic. The patient was also prescribed a pair of elastic compression stockings of the 20-30 mmHg pressure variety. x-ray of the right ankle was done on 09/20/2015 and showed posttraumatic and postsurgical changes of the right ankle with secondary degenerative changes of the tibiotalar joint and to a lesser degree the subtalar joint. No definite acute bony abnormalities are noted. Past medical history significant for diabetes mellitus, hypertension, hyperlipidemia, right breast cancer treated with a mastectomy in 2014. She has never smoked. 10/24/2015 -- she had delayed her vascular test because of her husband surgery but she is now ready to get him taken care of. He is also unable to use compression stockings and hence we will need to order her Juzo wraps. 10/31/2015-- was seen by Dr. Wyn Quaker on 10/28/2015. She had a left lower extremity arterial duplex done at his office a couple of years ago and that was essentially normal. Today they performed a venous duplex which revealed no evidence of deep vein thrombosis, superficial thrombophlebitis, no venous reflex was seen on the right and a minimal amount of reflux was seen on the left great saphenous vein but no significant reflux was seen. Impression was that there was a component of lymphedema present from a previous surgery and he would recommend compression  stockings and leg elevation. Readmission: 07-24-2022 upon evaluation today patient presents for initial inspection here in our clinic concerning issues that she has been having with her legs this is actually been going on  Details Patient Name: Date of Service: [facility], [name] 07/31/2023 11:00 A M Medical Record Number: [id number] Patient Account Number: [id number] Date of Birth/Sex: Treating RN: 1941-09-25 (82 y.o. [name] Primary Care Provider: Barbette Reichmann Other Clinician: Betha Loa Referring Provider: Treating Provider/Extender: [name], [name], Vishwanath Weeks in Treatment: 85 Verbal / Phone Orders: Yes Clinician: Yevonne Pax Read Back and Verified: Yes Diagnosis Coding Follow-up Appointments Return Appointment in 2 weeks. Home Health Home Health Company: - Harlin Rain, RN [phone] [facility] for wound care. May utilize formulary equivalent dressing for wound treatment orders unless otherwise specified. Home Health Nurse may visit PRN to address patients wound care needs. - left trochanter, santyl daily all other  wounds- vashe damp tp dry daily   Off-LoadingLow air-loss mattress (Group 2) Wound Treatment Wound #11 - Trochanter Wound Laterality: Right Cleanser: Vashe 5.8 (oz) (Home Health) 1 x Per Day/30 Days Discharge Instructions: damp to dry daily Secondary Dressing: (BORDER) Zetuvit Plus SILICONE BORDER Dressing 5x5 (in/in) 1 x Per Day/30 Days Discharge Instructions: Please do not put silicone bordered dressings under wraps. Use non-bordered dressing only. Wound #13 - Calcaneus Wound Laterality: Left Cleanser: Vashe 5.8 (oz) (Home Health) 1 x Per Day/30 Days Discharge Instructions: damp to dry daily Secondary Dressing: ABD Pad 5x9 (in/in) 1 x Per Day/30 Days Discharge Instructions: Cover with ABD pad Secondary Dressing: Kerlix 4.5 x 4.1 (in/yd) 1 x Per Day/30 Days Discharge Instructions: Apply Kerlix 4.5 x 4.1 (in/yd) as instructed Secured With: ACE WRAP - 422M ACE Elastic Bandage With VELCRO Brand Closure, 4 (in) 1 x Per Day/30 Days Wound #14 - Lower Leg Wound Laterality: Right, Medial, Distal Cleanser: Vashe 5.8 (oz) (Home Health) 1 x Per Day/30 Days Discharge Instructions: damp to dry daily Secondary Dressing: ABD Pad 5x9 (in/in) 1 x Per Day/30 Days Discharge Instructions: Cover with ABD pad Magouirk, Maykayla L ([id number]) [id number] Page 5 of 12 Secondary Dressing: Kerlix 4.5 x 4.1 (in/yd) 1 x Per Day/30 Days Discharge Instructions: Apply Kerlix 4.5 x 4.1 (in/yd) as instructed Secured With: ACE WRAP - 422M ACE Elastic Bandage With VELCRO Brand Closure, 4 (in) 1 x Per Day/30 Days Wound #15 - Lower Leg Wound Laterality: Left, Anterior Cleanser: Vashe 5.8 (oz) (Home Health) 1 x Per Day/30 Days Discharge Instructions: damp to dry daily Secondary Dressing: ABD Pad 5x9 (in/in) 1 x Per Day/30 Days Discharge Instructions: Cover with ABD pad Secondary Dressing: Kerlix 4.5 x 4.1 (in/yd) 1 x Per Day/30 Days Discharge Instructions: Apply Kerlix 4.5 x 4.1 (in/yd) as  instructed Secured With: ACE WRAP - 422M ACE Elastic Bandage With VELCRO Brand Closure, 4 (in) 1 x Per Day/30 Days Wound #16 - Trochanter Wound Laterality: Left Cleanser: Normal Saline (Home Health) 1 x Per Day/30 Days Discharge Instructions: Wash your hands with soap and water. Remove old dressing, discard into plastic bag and place into trash. Cleanse the wound with Normal Saline prior to applying a clean dressing using gauze sponges, not tissues or cotton balls. Do not scrub or use excessive force. Pat dry using gauze sponges, not tissue or cotton balls. Topical: Santyl Collagenase Ointment, 30 (gm), tube (Home Health) 1 x Per Day/30 Days Discharge Instructions: apply nickel thick to wound bed only Prim Dressing: Gauze 1 x Per Day/30 Days ary Discharge Instructions: Vashe damp to dry over Santyl Secondary Dressing: (BORDER) Zetuvit Plus SILICONE BORDER Dressing 4x4 (in/in) (Home Health) 1 x Per Day/30 Days Discharge Instructions: Please do not put silicone  Details Patient Name: Date of Service: [facility], [name] 07/31/2023 11:00 A M Medical Record Number: [id number] Patient Account Number: [id number] Date of Birth/Sex: Treating RN: 1941-09-25 (82 y.o. [name] Primary Care Provider: Barbette Reichmann Other Clinician: Betha Loa Referring Provider: Treating Provider/Extender: [name], [name], Vishwanath Weeks in Treatment: 85 Verbal / Phone Orders: Yes Clinician: Yevonne Pax Read Back and Verified: Yes Diagnosis Coding Follow-up Appointments Return Appointment in 2 weeks. Home Health Home Health Company: - Harlin Rain, RN [phone] [facility] for wound care. May utilize formulary equivalent dressing for wound treatment orders unless otherwise specified. Home Health Nurse may visit PRN to address patients wound care needs. - left trochanter, santyl daily all other  wounds- vashe damp tp dry daily   Off-LoadingLow air-loss mattress (Group 2) Wound Treatment Wound #11 - Trochanter Wound Laterality: Right Cleanser: Vashe 5.8 (oz) (Home Health) 1 x Per Day/30 Days Discharge Instructions: damp to dry daily Secondary Dressing: (BORDER) Zetuvit Plus SILICONE BORDER Dressing 5x5 (in/in) 1 x Per Day/30 Days Discharge Instructions: Please do not put silicone bordered dressings under wraps. Use non-bordered dressing only. Wound #13 - Calcaneus Wound Laterality: Left Cleanser: Vashe 5.8 (oz) (Home Health) 1 x Per Day/30 Days Discharge Instructions: damp to dry daily Secondary Dressing: ABD Pad 5x9 (in/in) 1 x Per Day/30 Days Discharge Instructions: Cover with ABD pad Secondary Dressing: Kerlix 4.5 x 4.1 (in/yd) 1 x Per Day/30 Days Discharge Instructions: Apply Kerlix 4.5 x 4.1 (in/yd) as instructed Secured With: ACE WRAP - 422M ACE Elastic Bandage With VELCRO Brand Closure, 4 (in) 1 x Per Day/30 Days Wound #14 - Lower Leg Wound Laterality: Right, Medial, Distal Cleanser: Vashe 5.8 (oz) (Home Health) 1 x Per Day/30 Days Discharge Instructions: damp to dry daily Secondary Dressing: ABD Pad 5x9 (in/in) 1 x Per Day/30 Days Discharge Instructions: Cover with ABD pad Magouirk, Maykayla L ([id number]) [id number] Page 5 of 12 Secondary Dressing: Kerlix 4.5 x 4.1 (in/yd) 1 x Per Day/30 Days Discharge Instructions: Apply Kerlix 4.5 x 4.1 (in/yd) as instructed Secured With: ACE WRAP - 422M ACE Elastic Bandage With VELCRO Brand Closure, 4 (in) 1 x Per Day/30 Days Wound #15 - Lower Leg Wound Laterality: Left, Anterior Cleanser: Vashe 5.8 (oz) (Home Health) 1 x Per Day/30 Days Discharge Instructions: damp to dry daily Secondary Dressing: ABD Pad 5x9 (in/in) 1 x Per Day/30 Days Discharge Instructions: Cover with ABD pad Secondary Dressing: Kerlix 4.5 x 4.1 (in/yd) 1 x Per Day/30 Days Discharge Instructions: Apply Kerlix 4.5 x 4.1 (in/yd) as  instructed Secured With: ACE WRAP - 422M ACE Elastic Bandage With VELCRO Brand Closure, 4 (in) 1 x Per Day/30 Days Wound #16 - Trochanter Wound Laterality: Left Cleanser: Normal Saline (Home Health) 1 x Per Day/30 Days Discharge Instructions: Wash your hands with soap and water. Remove old dressing, discard into plastic bag and place into trash. Cleanse the wound with Normal Saline prior to applying a clean dressing using gauze sponges, not tissues or cotton balls. Do not scrub or use excessive force. Pat dry using gauze sponges, not tissue or cotton balls. Topical: Santyl Collagenase Ointment, 30 (gm), tube (Home Health) 1 x Per Day/30 Days Discharge Instructions: apply nickel thick to wound bed only Prim Dressing: Gauze 1 x Per Day/30 Days ary Discharge Instructions: Vashe damp to dry over Santyl Secondary Dressing: (BORDER) Zetuvit Plus SILICONE BORDER Dressing 4x4 (in/in) (Home Health) 1 x Per Day/30 Days Discharge Instructions: Please do not put silicone  visit I recommended she go to the ED for potential admission, IV antibiotics and further imaging. She had MRI of the pelvis and left foot that showed osteomyelitis. Podiatry advised conservative management. She was also seen by infectious disease who discharged patient on 2 weeks of linezolid and 2 weeks of ciprofloxacin. Patient is at home and has home health. Palliative care was consulted during the stay and she is full code and has declined further outpatient palliative care and hospice services.. She currently denies systemic signs of infection. Overall there is improvement in the appearance of the wounds. There is healthier granulation tissue present. However this is overall poor prognosis case. She has Prevalon boots but it is unclear if she is using these. She does not have them on today. She follows up with infectious disease next week. 8/7; Patient's been using Vashe wet-to-dry dressing to all the wound beds except for the left hip she has been using  Santyl. She followed up with infectious disease on 8/1. Currently she is on linezolid and ciprofloxacin. They recommended lab work to see if she needs extension of her oral antibiotics. She currently denies systemic signs of infection. 8/21; patient presents for follow-up. She has been using Vashe wet-to-dry dressings to all wounds except the left hip she has been using Santyl. All wounds have improved in size and appearance. She has healthy granulation tissue present to all wounds except the left hip still has nonviable tissue throughout. She states she has completed her oral antibiotics per ID. She currently denies systemic signs of infection. 9/4; patient presents for follow-up. She has been using Vashe wet-to-dry dressings to all wounds except the left hip. T the left hip she has been using Santyl. o All wounds appear well-healing. She currently denies systemic signs of infection. 9/18; 2-week follow-up. This is a very disabled woman who is nonambulatory. 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 She has significant bilateral lower extremity pressure ulcers bilaterally. We have been using Vashe wet-to-dry to most of these except Santyl in the left hip wound Electronic Signature(s) Signed: 07/31/2023 5:02:29 PM By: Baltazar Najjar MD Entered By: Baltazar Najjar on 07/31/2023 09:14:29 -------------------------------------------------------------------------------- Physical Exam Details Patient Name: Date of Service: [facility], [name] 07/31/2023 11:00 A M Medical Record Number: [id number] Patient Account Number: [id number] Date of Birth/Sex: Treating RN: 02-25-1941 (82 y.o. [name] Primary Care Provider: Barbette Reichmann Other Clinician: Betha Loa Referring Provider: Treating Provider/Extender: [name], [name], Vishwanath Weeks in Treatment: 44 Constitutional Sitting or standing Blood Pressure is within target range for  patient.. Pulse regular and within target range for patient.Marland Kitchen Respirations regular, non-labored and within target range.Marland Kitchen appears in no distress. . Frail woman speech is clear.. Notes Wound exam ELIANY, HEMPFLING L ([id number]) [id number] Page 4 of 12 The patient has substantial wounds on the right medial first metatarsal head right calcaneus and right medial ankle. Apparently the first metatarsal head wound is new. On the distal left leg she has an area anteriorly as well as the left heel. None of these look particularly ominous so far. 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 At some point this will likely need to be debrided. Electronic Signature(s) Signed: 07/31/2023 5:02:29 PM By: Baltazar Najjar MD Entered By: Baltazar Najjar on 07/31/2023 09:17:43 -------------------------------------------------------------------------------- Physician Orders  bordered dressings under wraps. Use non-bordered dressing only. Wound #18 - Metatarsal head first Wound Laterality: Right Cleanser: Normal Saline (Home Health) 1 x Per Day/30 Days Discharge Instructions: Wash your hands with soap and water. Remove old dressing, discard into plastic bag and place into trash. Cleanse the wound with Normal Saline prior to applying a clean dressing using gauze sponges, not tissues or cotton balls. Do not scrub or use excessive force. Pat dry using gauze sponges, not tissue or cotton balls. Prim Dressing: Gauze 1 x Per Day/30 Days ary Discharge Instructions: Vashe damp to dry Secondary Dressing: (BORDER) Zetuvit Plus SILICONE BORDER Dressing 4x4 (in/in) (Home Health) 1 x Per Day/30 Days Discharge Instructions: Please do not put silicone bordered dressings under wraps. Use non-bordered dressing only. Wound #6 - Calcaneus Wound Laterality: Right Cleanser: Vashe 5.8 (oz) (Home Health) 1 x Per Day/30  Days Discharge Instructions: damp to dry daily over Santyl Secondary Dressing: ABD Pad 5x9 (in/in) 1 x Per Day/30 Days Discharge Instructions: Cover with ABD pad Secondary Dressing: Kerlix 4.5 x 4.1 (in/yd) 1 x Per Day/30 Days Discharge Instructions: Apply Kerlix 4.5 x 4.1 (in/yd) as instructed Secured With: ACE WRAP - 16M ACE Elastic Bandage With VELCRO Brand Closure, 4 (in) 1 x Per Day/30 Days Electronic Signature(s) Signed: 07/31/2023 5:02:29 PM By: Baltazar Najjar MD Signed: 07/31/2023 5:25:51 PM By: Betha Loa Entered By: Betha Loa on 07/31/2023 09:17:30 Wantz, [name] ([id number]) [id number] Page 6 of 12 -------------------------------------------------------------------------------- Problem List Details Patient Name: Date of Service: [facility], [name] 07/31/2023 11:00 A M Medical Record Number: [id number] Patient Account Number: [id number] Date of Birth/Sex: Treating RN: 06-07-1941 (82 y.o. [name] Primary Care Provider: Barbette Reichmann Other Clinician: Betha Loa Referring Provider: Treating Provider/Extender: [name], [name], Vishwanath Weeks in Treatment: 47 Active Problems ICD-10 Encounter Code Description Active Date MDM Diagnosis L97.822 Non-pressure chronic ulcer of other part of left lower leg with fat layer 09/26/2022 No Yes exposed L97.812 Non-pressure chronic ulcer of other part of right lower leg with fat layer 03/13/2023 No Yes exposed I87.312 Chronic venous hypertension (idiopathic) with ulcer of left lower extremity 09/26/2022 No Yes I89.0 Lymphedema, not elsewhere classified 09/26/2022 No Yes E11.622 Type 2 diabetes mellitus with other skin ulcer 09/26/2022 No Yes L89.610 Pressure ulcer of right heel, unstageable 09/26/2022 No Yes L89.513 Pressure ulcer of right ankle, stage 3 12/05/2022 No Yes L89.620 Pressure ulcer of left heel, unstageable 12/05/2022 No Yes M86.172 Other acute  osteomyelitis, left ankle and foot 10/24/2022 No Yes L89.214 Pressure ulcer of right hip, stage 4 11/21/2022 No Yes L89.220 Pressure ulcer of left hip, unstageable 05/22/2023 No Yes I50.32 Chronic diastolic (congestive) heart failure 09/26/2022 No Yes Granito, Erick L ([id number]) [id number] Page 7 of 12 Z79.84 Long term (current) use of oral hypoglycemic drugs 03/13/2023 No Yes Inactive Problems ICD-10 Code Description Active Date Inactive Date S81.802A Unspecified open wound, left lower leg, initial encounter 05/22/2023 05/22/2023 Resolved Problems ICD-10 Code Description Active Date Resolved Date L89.320 Pressure ulcer of left buttock, unstageable 09/26/2022 09/26/2022 S91.302A Unspecified open wound, left foot, initial encounter 10/24/2022 10/24/2022 Electronic Signature(s) Signed: 07/31/2023 5:02:29 PM By: Baltazar Najjar MD Entered By: Baltazar Najjar on 07/31/2023 09:12:12 -------------------------------------------------------------------------------- Progress Note Details Patient Name: Date of Service: [facility], [name] 07/31/2023 11:00 A M Medical Record Number: [id number] Patient Account Number: [id number] Date of Birth/Sex: Treating RN: Jun 06, 1941 (82 y.o. [name] Primary Care Provider: Barbette Reichmann Other Clinician: Betha Loa Referring Provider: Treating  bordered dressings under wraps. Use non-bordered dressing only. Wound #18 - Metatarsal head first Wound Laterality: Right Cleanser: Normal Saline (Home Health) 1 x Per Day/30 Days Discharge Instructions: Wash your hands with soap and water. Remove old dressing, discard into plastic bag and place into trash. Cleanse the wound with Normal Saline prior to applying a clean dressing using gauze sponges, not tissues or cotton balls. Do not scrub or use excessive force. Pat dry using gauze sponges, not tissue or cotton balls. Prim Dressing: Gauze 1 x Per Day/30 Days ary Discharge Instructions: Vashe damp to dry Secondary Dressing: (BORDER) Zetuvit Plus SILICONE BORDER Dressing 4x4 (in/in) (Home Health) 1 x Per Day/30 Days Discharge Instructions: Please do not put silicone bordered dressings under wraps. Use non-bordered dressing only. Wound #6 - Calcaneus Wound Laterality: Right Cleanser: Vashe 5.8 (oz) (Home Health) 1 x Per Day/30  Days Discharge Instructions: damp to dry daily over Santyl Secondary Dressing: ABD Pad 5x9 (in/in) 1 x Per Day/30 Days Discharge Instructions: Cover with ABD pad Secondary Dressing: Kerlix 4.5 x 4.1 (in/yd) 1 x Per Day/30 Days Discharge Instructions: Apply Kerlix 4.5 x 4.1 (in/yd) as instructed Secured With: ACE WRAP - 16M ACE Elastic Bandage With VELCRO Brand Closure, 4 (in) 1 x Per Day/30 Days Electronic Signature(s) Signed: 07/31/2023 5:02:29 PM By: Baltazar Najjar MD Signed: 07/31/2023 5:25:51 PM By: Betha Loa Entered By: Betha Loa on 07/31/2023 09:17:30 Wantz, [name] ([id number]) [id number] Page 6 of 12 -------------------------------------------------------------------------------- Problem List Details Patient Name: Date of Service: [facility], [name] 07/31/2023 11:00 A M Medical Record Number: [id number] Patient Account Number: [id number] Date of Birth/Sex: Treating RN: 06-07-1941 (82 y.o. [name] Primary Care Provider: Barbette Reichmann Other Clinician: Betha Loa Referring Provider: Treating Provider/Extender: [name], [name], Vishwanath Weeks in Treatment: 47 Active Problems ICD-10 Encounter Code Description Active Date MDM Diagnosis L97.822 Non-pressure chronic ulcer of other part of left lower leg with fat layer 09/26/2022 No Yes exposed L97.812 Non-pressure chronic ulcer of other part of right lower leg with fat layer 03/13/2023 No Yes exposed I87.312 Chronic venous hypertension (idiopathic) with ulcer of left lower extremity 09/26/2022 No Yes I89.0 Lymphedema, not elsewhere classified 09/26/2022 No Yes E11.622 Type 2 diabetes mellitus with other skin ulcer 09/26/2022 No Yes L89.610 Pressure ulcer of right heel, unstageable 09/26/2022 No Yes L89.513 Pressure ulcer of right ankle, stage 3 12/05/2022 No Yes L89.620 Pressure ulcer of left heel, unstageable 12/05/2022 No Yes M86.172 Other acute  osteomyelitis, left ankle and foot 10/24/2022 No Yes L89.214 Pressure ulcer of right hip, stage 4 11/21/2022 No Yes L89.220 Pressure ulcer of left hip, unstageable 05/22/2023 No Yes I50.32 Chronic diastolic (congestive) heart failure 09/26/2022 No Yes Granito, Erick L ([id number]) [id number] Page 7 of 12 Z79.84 Long term (current) use of oral hypoglycemic drugs 03/13/2023 No Yes Inactive Problems ICD-10 Code Description Active Date Inactive Date S81.802A Unspecified open wound, left lower leg, initial encounter 05/22/2023 05/22/2023 Resolved Problems ICD-10 Code Description Active Date Resolved Date L89.320 Pressure ulcer of left buttock, unstageable 09/26/2022 09/26/2022 S91.302A Unspecified open wound, left foot, initial encounter 10/24/2022 10/24/2022 Electronic Signature(s) Signed: 07/31/2023 5:02:29 PM By: Baltazar Najjar MD Entered By: Baltazar Najjar on 07/31/2023 09:12:12 -------------------------------------------------------------------------------- Progress Note Details Patient Name: Date of Service: [facility], [name] 07/31/2023 11:00 A M Medical Record Number: [id number] Patient Account Number: [id number] Date of Birth/Sex: Treating RN: Jun 06, 1941 (82 y.o. [name] Primary Care Provider: Barbette Reichmann Other Clinician: Betha Loa Referring Provider: Treating  [name], [name] ([id number]) [id number] Page 1 of 12 Visit Report for 07/31/2023 Debridement Details Patient Name: Date of Service: [facility], [name] 07/31/2023 11:00 A M Medical Record Number: [id number] Patient Account Number: [id number] Date of Birth/Sex: Treating RN: 07/24/1941 (82 y.o. [name] Primary Care Provider: Barbette Reichmann Other Clinician: Betha Loa Referring Provider: Treating Provider/Extender: [name], [name], Vishwanath Weeks in Treatment: 44 Debridement Performed for Assessment: Wound #16 Left Trochanter Performed By: Physician Maxwell Caul, MD Debridement Type: Chemical/Enzymatic/Mechanical Agent Used: Santyl Level of Consciousness (Pre-procedure): Awake and Alert Pre-procedure Verification/Time Out Yes - 12:05 Taken: Start Time: 12:05 Percent of Wound Bed Debrided: Instrument: Curette Bleeding: None Response to Treatment: Procedure was tolerated well Level of Consciousness (Post- Awake and Alert procedure): Post Debridement Measurements of Total Wound Length: (cm) 1.5 Stage: Category/Stage IV Width: (cm) 2 Depth: (cm) 0.8 Volume: (cm) 1.885 Character of Wound/Ulcer Post Debridement: Stable Post Procedure Diagnosis Same as Pre-procedure Electronic Signature(s) Signed: 07/31/2023 5:02:29 PM By: Baltazar Najjar MD Signed: 07/31/2023 5:25:51 PM By: Betha Loa Signed: 08/01/2023 1:18:15 PM By: Yevonne Pax RN Entered By: Betha Loa on 07/31/2023 09:17:16 -------------------------------------------------------------------------------- HPI Details Patient Name: Date of Service: [facility], [name] 07/31/2023 11:00 A M Medical Record Number: [id number] Patient Account Number: [id number] Date of Birth/Sex: Treating RN: December 17, 1940 (82 y.o. [name] Primary Care Provider: Barbette Reichmann Other Clinician: Betha Loa Referring Provider: Treating Provider/Extender: [address]  MICHA EL Allegra Grana [place], Milaina L ([id number]) [id number] Page 2 of 12 Weeks in Treatment: 21 History of Present Illness HPI Description: 82 year old patient who comes with a referral for bilateral lower extremity edema and a lower extremity ulceration and has been sent by her PCP [name]. I understand the patient was recently put on amoxicillin and doxycycline but could not tolerate the amoxicillin. doxycycline course was completed. a BNP and EKG was supposed to be normal and the patient did not have any dyspnea. the patient has been on a diuretic. The patient was also prescribed a pair of elastic compression stockings of the 20-30 mmHg pressure variety. x-ray of the right ankle was done on 09/20/2015 and showed posttraumatic and postsurgical changes of the right ankle with secondary degenerative changes of the tibiotalar joint and to a lesser degree the subtalar joint. No definite acute bony abnormalities are noted. Past medical history significant for diabetes mellitus, hypertension, hyperlipidemia, right breast cancer treated with a mastectomy in 2014. She has never smoked. 10/24/2015 -- she had delayed her vascular test because of her husband surgery but she is now ready to get him taken care of. He is also unable to use compression stockings and hence we will need to order her Juzo wraps. 10/31/2015-- was seen by Dr. Wyn Quaker on 10/28/2015. She had a left lower extremity arterial duplex done at his office a couple of years ago and that was essentially normal. Today they performed a venous duplex which revealed no evidence of deep vein thrombosis, superficial thrombophlebitis, no venous reflex was seen on the right and a minimal amount of reflux was seen on the left great saphenous vein but no significant reflux was seen. Impression was that there was a component of lymphedema present from a previous surgery and he would recommend compression  stockings and leg elevation. Readmission: 07-24-2022 upon evaluation today patient presents for initial inspection here in our clinic concerning issues that she has been having with her legs this is actually been going on  [name], [name] ([id number]) [id number] Page 1 of 12 Visit Report for 07/31/2023 Debridement Details Patient Name: Date of Service: [facility], [name] 07/31/2023 11:00 A M Medical Record Number: [id number] Patient Account Number: [id number] Date of Birth/Sex: Treating RN: 07/24/1941 (82 y.o. [name] Primary Care Provider: Barbette Reichmann Other Clinician: Betha Loa Referring Provider: Treating Provider/Extender: [name], [name], Vishwanath Weeks in Treatment: 44 Debridement Performed for Assessment: Wound #16 Left Trochanter Performed By: Physician Maxwell Caul, MD Debridement Type: Chemical/Enzymatic/Mechanical Agent Used: Santyl Level of Consciousness (Pre-procedure): Awake and Alert Pre-procedure Verification/Time Out Yes - 12:05 Taken: Start Time: 12:05 Percent of Wound Bed Debrided: Instrument: Curette Bleeding: None Response to Treatment: Procedure was tolerated well Level of Consciousness (Post- Awake and Alert procedure): Post Debridement Measurements of Total Wound Length: (cm) 1.5 Stage: Category/Stage IV Width: (cm) 2 Depth: (cm) 0.8 Volume: (cm) 1.885 Character of Wound/Ulcer Post Debridement: Stable Post Procedure Diagnosis Same as Pre-procedure Electronic Signature(s) Signed: 07/31/2023 5:02:29 PM By: Baltazar Najjar MD Signed: 07/31/2023 5:25:51 PM By: Betha Loa Signed: 08/01/2023 1:18:15 PM By: Yevonne Pax RN Entered By: Betha Loa on 07/31/2023 09:17:16 -------------------------------------------------------------------------------- HPI Details Patient Name: Date of Service: [facility], [name] 07/31/2023 11:00 A M Medical Record Number: [id number] Patient Account Number: [id number] Date of Birth/Sex: Treating RN: December 17, 1940 (82 y.o. [name] Primary Care Provider: Barbette Reichmann Other Clinician: Betha Loa Referring Provider: Treating Provider/Extender: [address]  MICHA EL Allegra Grana [place], Milaina L ([id number]) [id number] Page 2 of 12 Weeks in Treatment: 21 History of Present Illness HPI Description: 82 year old patient who comes with a referral for bilateral lower extremity edema and a lower extremity ulceration and has been sent by her PCP [name]. I understand the patient was recently put on amoxicillin and doxycycline but could not tolerate the amoxicillin. doxycycline course was completed. a BNP and EKG was supposed to be normal and the patient did not have any dyspnea. the patient has been on a diuretic. The patient was also prescribed a pair of elastic compression stockings of the 20-30 mmHg pressure variety. x-ray of the right ankle was done on 09/20/2015 and showed posttraumatic and postsurgical changes of the right ankle with secondary degenerative changes of the tibiotalar joint and to a lesser degree the subtalar joint. No definite acute bony abnormalities are noted. Past medical history significant for diabetes mellitus, hypertension, hyperlipidemia, right breast cancer treated with a mastectomy in 2014. She has never smoked. 10/24/2015 -- she had delayed her vascular test because of her husband surgery but she is now ready to get him taken care of. He is also unable to use compression stockings and hence we will need to order her Juzo wraps. 10/31/2015-- was seen by Dr. Wyn Quaker on 10/28/2015. She had a left lower extremity arterial duplex done at his office a couple of years ago and that was essentially normal. Today they performed a venous duplex which revealed no evidence of deep vein thrombosis, superficial thrombophlebitis, no venous reflex was seen on the right and a minimal amount of reflux was seen on the left great saphenous vein but no significant reflux was seen. Impression was that there was a component of lymphedema present from a previous surgery and he would recommend compression  stockings and leg elevation. Readmission: 07-24-2022 upon evaluation today patient presents for initial inspection here in our clinic concerning issues that she has been having with her legs this is actually been going on

## 2023-08-02 DIAGNOSIS — L89214 Pressure ulcer of right hip, stage 4: Secondary | ICD-10-CM | POA: Diagnosis not present

## 2023-08-02 DIAGNOSIS — E1169 Type 2 diabetes mellitus with other specified complication: Secondary | ICD-10-CM | POA: Diagnosis not present

## 2023-08-02 DIAGNOSIS — L89220 Pressure ulcer of left hip, unstageable: Secondary | ICD-10-CM | POA: Diagnosis not present

## 2023-08-02 DIAGNOSIS — M86172 Other acute osteomyelitis, left ankle and foot: Secondary | ICD-10-CM | POA: Diagnosis not present

## 2023-08-02 DIAGNOSIS — L89623 Pressure ulcer of left heel, stage 3: Secondary | ICD-10-CM | POA: Diagnosis not present

## 2023-08-02 DIAGNOSIS — L89613 Pressure ulcer of right heel, stage 3: Secondary | ICD-10-CM | POA: Diagnosis not present

## 2023-08-02 DIAGNOSIS — L988 Other specified disorders of the skin and subcutaneous tissue: Secondary | ICD-10-CM | POA: Diagnosis not present

## 2023-08-05 ENCOUNTER — Ambulatory Visit (INDEPENDENT_AMBULATORY_CARE_PROVIDER_SITE_OTHER): Payer: PPO | Admitting: Physician Assistant

## 2023-08-05 DIAGNOSIS — Z466 Encounter for fitting and adjustment of urinary device: Secondary | ICD-10-CM

## 2023-08-05 DIAGNOSIS — R339 Retention of urine, unspecified: Secondary | ICD-10-CM | POA: Diagnosis not present

## 2023-08-05 NOTE — Progress Notes (Signed)
Cath Change/ Replacement  Patient is present today for a catheter change due to urinary retention.  11ml of water was removed from the balloon, a 16FR foley cath was removed without difficulty.  Patient was cleaned and prepped in a sterile fashion with betadine. A 16 FR foley cath was replaced into the bladder, no complications were noted. Urine return was noted 5ml and urine was yellow in color. The balloon was filled with 20ml of sterile water. A night bag was attached for drainage.  Patient tolerated well.    Performed by: Carman Ching, PA-C   Additional notes: They confirm that her Foley came out twice and was replaced by home health since her last visit. It came out with the balloon inflated and they are certain it did not snag on anything. I elected to hyperinflate her balloon today to 20cc sterile water and affixed the catheter to her right leg with a StatLock to try to prevent pulls.  They prefer to continue with monthly catheter exchanges in clinic; they are currently re-certifying with home health and I asked her son to call us when this is done/the name of the agency so we can give verbal orders for Foley replacement PRN.  Follow up: Return in about 4 weeks (around 09/02/2023) for Catheter exchange.

## 2023-08-06 ENCOUNTER — Ambulatory Visit: Payer: PPO | Attending: Infectious Diseases | Admitting: Infectious Diseases

## 2023-08-06 ENCOUNTER — Encounter: Payer: Self-pay | Admitting: Infectious Diseases

## 2023-08-06 VITALS — BP 89/57 | HR 69 | Temp 96.5°F

## 2023-08-06 DIAGNOSIS — L89619 Pressure ulcer of right heel, unspecified stage: Secondary | ICD-10-CM | POA: Diagnosis not present

## 2023-08-06 DIAGNOSIS — C50919 Malignant neoplasm of unspecified site of unspecified female breast: Secondary | ICD-10-CM | POA: Insufficient documentation

## 2023-08-06 DIAGNOSIS — I509 Heart failure, unspecified: Secondary | ICD-10-CM | POA: Insufficient documentation

## 2023-08-06 DIAGNOSIS — Z466 Encounter for fitting and adjustment of urinary device: Secondary | ICD-10-CM | POA: Diagnosis not present

## 2023-08-06 DIAGNOSIS — Z86718 Personal history of other venous thrombosis and embolism: Secondary | ICD-10-CM | POA: Insufficient documentation

## 2023-08-06 DIAGNOSIS — E8809 Other disorders of plasma-protein metabolism, not elsewhere classified: Secondary | ICD-10-CM | POA: Diagnosis not present

## 2023-08-06 DIAGNOSIS — E43 Unspecified severe protein-calorie malnutrition: Secondary | ICD-10-CM | POA: Diagnosis not present

## 2023-08-06 DIAGNOSIS — Z4800 Encounter for change or removal of nonsurgical wound dressing: Secondary | ICD-10-CM | POA: Insufficient documentation

## 2023-08-06 DIAGNOSIS — R339 Retention of urine, unspecified: Secondary | ICD-10-CM | POA: Diagnosis not present

## 2023-08-06 DIAGNOSIS — T07XXXA Unspecified multiple injuries, initial encounter: Secondary | ICD-10-CM

## 2023-08-06 DIAGNOSIS — D649 Anemia, unspecified: Secondary | ICD-10-CM | POA: Diagnosis not present

## 2023-08-06 DIAGNOSIS — L89219 Pressure ulcer of right hip, unspecified stage: Secondary | ICD-10-CM | POA: Insufficient documentation

## 2023-08-06 DIAGNOSIS — I110 Hypertensive heart disease with heart failure: Secondary | ICD-10-CM | POA: Insufficient documentation

## 2023-08-06 DIAGNOSIS — Z9011 Acquired absence of right breast and nipple: Secondary | ICD-10-CM | POA: Insufficient documentation

## 2023-08-06 DIAGNOSIS — E46 Unspecified protein-calorie malnutrition: Secondary | ICD-10-CM | POA: Insufficient documentation

## 2023-08-06 DIAGNOSIS — Z981 Arthrodesis status: Secondary | ICD-10-CM | POA: Diagnosis not present

## 2023-08-06 DIAGNOSIS — L89629 Pressure ulcer of left heel, unspecified stage: Secondary | ICD-10-CM | POA: Insufficient documentation

## 2023-08-06 DIAGNOSIS — Z853 Personal history of malignant neoplasm of breast: Secondary | ICD-10-CM | POA: Diagnosis not present

## 2023-08-06 NOTE — Progress Notes (Unsigned)
NAME: [name]  DOB: [birth year]  MRN: [id number]  Date/Time: 08/06/2023 11:23 AM   Subjective:   Pt is here with her son for follow up of multiple pressure wounds.  Last seen a month ago She is followed at wound clinic as well She has completed antibiotics.  She is feeling better The wounds are starting to heal She still is frail and sarcopenia  Following taken from the last visit note   she was in [facility] between 05/22/2023 until 05/31/2023 MRSA and pseudomonas infected multiple wounds- completed 5 weeks of cipro and linezolid- completed 06/27/23 Doing better Wounds are healing Her nutrition  status is poor   [name] is a 82 y.o. 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  She was hospitalized again in July with multiple wounds after being sent from the wound clinic and culture had MRSA Past Medical History:  Diagnosis Date   Arthritis    Arthritis    Breast cancer (HCC) 1992   right breast with lumpectomy and rad tx   Cancer of right breast (HCC) 04/16/2013   right breast with mastectomy   Diabetes mellitus without complication (HCC)    Gout    High cholesterol    Hyperlipidemia    Hypertension    Personal history of radiation therapy     Past Surgical History:  Procedure Laterality Date   ABDOMINAL HYSTERECTOMY     ANKLE FRACTURE SURGERY Right 2004   BREAST LUMPECTOMY Right 1992   positive   IRRIGATION AND DEBRIDEMENT FOOT Right 11/02/2022   Procedure: IRRIGATION AND DEBRIDEMENT RIGHT FOOT AND BONE BIOPSY RIGHT FOOT;  Surgeon: Gwyneth Revels, DPM;  Location: [facility] ORS;  Service: Podiatry;  Laterality: Right;   IVC FILTER INSERTION N/A 11/06/2022   Procedure: IVC FILTER INSERTION;  Surgeon: Annice Needy, MD;  Location: [facility] INVASIVE CV LAB;  Service: Cardiovascular;  Laterality: N/A;   MASTECTOMY Right 2014   PERIPHERAL VASCULAR THROMBECTOMY Left 11/06/2022   Procedure: PERIPHERAL VASCULAR THROMBECTOMY- EXTERNAL Iliac Vein/CFV;  Surgeon: Annice Needy, MD;  Location: [facility] INVASIVE CV LAB;  Service: Cardiovascular;  Laterality: Left;   SHOULDER SURGERY Right 2010    Social History   Socioeconomic History   Marital status: Married    Spouse name: Not on file   Number of children: 2   Years of education: Not on file   Highest education level: Not on file  Occupational History   Not on file  Tobacco Use   Smoking status: Never    Passive exposure: Never   Smokeless tobacco: Never  Vaping Use   Vaping status: Never Used  Substance and Sexual Activity   Alcohol use: No    Alcohol/week: 0.0 standard drinks of alcohol   Drug use: No   Sexual activity: Not Currently  Other Topics Concern   Not on file  Social History Narrative   Not on file   Social Determinants of Health   Financial Resource Strain: Not on file  Food Insecurity: No Food Insecurity (05/22/2023)   Hunger Vital Sign    Worried About Running Out  of Food in the Last Year: Never true    Ran Out of Food in the Last Year: Never true  Transportation Needs: No Transportation Needs (05/22/2023)   PRAPARE - Administrator, Civil Service (Medical): No    Lack of Transportation (Non-Medical): No  Physical Activity: Not on file  Stress: Not on file  Social Connections: Not on file  Intimate Partner Violence: Not At Risk (05/22/2023)   Humiliation, Afraid, Rape, and Kick questionnaire    Fear of Current or Ex-Partner: No    Emotionally Abused: No    Physically Abused: No    Sexually Abused: No    Family History  Problem Relation Age of Onset    Diabetes Father    Breast cancer Neg Hx    Allergies  Allergen Reactions   Other Anaphylaxis    Anesthisia has been a health issue for her in the past.    Sulfa Antibiotics Rash   I? Current Outpatient Medications  Medication Sig Dispense Refill   acetaminophen (TYLENOL) 500 MG tablet Take 500-1,000 mg by mouth 4 (four) times daily as needed for mild pain or moderate pain.     allopurinol (ZYLOPRIM) 300 MG tablet Take 300 mg by mouth daily.  11   ascorbic acid (VITAMIN C) 500 MG tablet Take 1 tablet (500 mg total) by mouth 2 (two) times daily. 30 tablet 0   aspirin EC 81 MG tablet Take 1 tablet (81 mg total) by mouth daily. Swallow whole. 30 tablet 12   atenolol (TENORMIN) 50 MG tablet Take 50 mg by mouth daily.     atorvastatin (LIPITOR) 40 MG tablet Take 40 mg by mouth daily.      diphenoxylate-atropine (LOMOTIL) 2.5-0.025 MG tablet Take 1-2 tablets by mouth 3 (three) times daily as needed.     ferrous sulfate 325 (65 FE) MG tablet Take 1 tablet (325 mg total) by mouth 2 (two) times daily with a meal. 60 tablet 3   gabapentin (NEURONTIN) 100 MG capsule TAKE 1 CAPSULE BY MOUTH ONCE DAILY FOR 7 DAYS THEN TAKE 1 CAPSULES BY MOUTH TWICE DAILY     gentamicin cream (GARAMYCIN) 0.1 % Apply 1 Application topically in the morning and at bedtime.     HYDROcodone-acetaminophen (NORCO) 7.5-325 MG tablet Take 1 tablet by mouth 2 (two) times daily as needed for moderate pain or severe pain.     levothyroxine (SYNTHROID) 88 MCG tablet Take 88 mcg by mouth daily.     metFORMIN (GLUCOPHAGE) 1000 MG tablet Take 1,000 mg by mouth in the morning and at bedtime.     Multiple Vitamin (MULTIVITAMIN WITH MINERALS) TABS tablet Take 1 tablet by mouth daily.     polyethylene glycol (MIRALAX / GLYCOLAX) 17 g packet Take 17 g by mouth daily. 14 each 0   sitaGLIPtin (JANUVIA) 50 MG tablet Take 50 mg by mouth daily.     thiamine (VITAMIN B-1) 100 MG tablet Take 1 tablet (100 mg total) by mouth daily. 30 tablet 0    Vaginal Moisturizer (REPLENS EXTERNAL COMFORT) GEL Apply 1 application  topically daily. 42.5 g 11   vitamin B-12 (CYANOCOBALAMIN) 1000 MCG tablet Take 1,000 mcg by mouth daily.     zinc sulfate 220 (50 Zn) MG capsule Take 1 capsule (220 mg total) by mouth daily. 30 capsule 0   No current facility-administered medications for this visit.     Abtx:  Anti-infectives (From admission, onward)    None  REVIEW OF SYSTEMS:  Const: negative fever, negative chills, weight loss  Eyes: negative diplopia or visual changes, negative eye pain ENT: negative coryza, negative sore throat Resp: negative cough, hemoptysis, dyspnea Cards: negative for chest pain, palpitations, lower extremity edema GU: Has Foley for urinary retention followed by urology with monthly Foley changes GI: Negative for abdominal pain, diarrhea, bleeding, constipation Skin: Multiple wounds Heme: negative for easy bruising and gum/nose bleeding YQ:MVHQIONGEXB weakness Neurolo:dizziness  Psych: negative for feelings of anxiety, depression  Endocrine: diabetes Allergy/Immunology- sulfa Objective:  VITALS:  BP (!) 89/57   Pulse 69   Temp (!) 96.5 F (35.8 C) (Temporal)   PHYSICAL EXAM:  General: Alert, cooperative, no distress, frail, pale, in wheelchair Head: Normocephalic, without obvious abnormality, atraumatic. Eyes: Conjunctivae clear, anicteric sclerae. Pupils are equal ENT Nares normal. No drainage or sinus tenderness. Lips, mucosa, and tongue normal. No Thrush Edentulous Neck:, symmetrical, no adenopathy, thyroid: non tender no carotid bruit and no JVD.  Skin multiple wounds All wounds are smaller and healing.  No evidence of obvious infection          Lungs: Clear to auscultation bilaterally. No Wheezing or Rhonchi. No rales. Heart: Regular rate and rhythm, no murmur, rub or gallop. Abdomen: did not examine as in wheel chair Foley in place Lymph: Cervical, supraclavicular  normal. Neurologic: Grossly non-focal   ? Impression/Recommendation ? Multiple pressure ulcers of the  , right hip at the greater trochanter area, bilateral heels - doing better, no evidence of infection currently Completed nearly 5 weeks of antibiotics ( cipro and linezolid ) in Aug- followed at wound clinic.  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 ___________________________________________________ Discussed with patient, and her son Followed at wound clinic Follow PRN with me   Note:  This document was prepared using Dragon voice recognition software and may include unintentional dictation errors.

## 2023-08-06 NOTE — Patient Instructions (Signed)
You are here for ffollow up of multiple wounds which are improving- please keep up the nutrition status and follow with wound clinic- you are discharged from my clinic and will follow as needed

## 2023-08-07 ENCOUNTER — Ambulatory Visit: Payer: PPO | Admitting: Internal Medicine

## 2023-08-14 ENCOUNTER — Encounter: Payer: PPO | Attending: Internal Medicine | Admitting: Internal Medicine

## 2023-08-14 DIAGNOSIS — I5032 Chronic diastolic (congestive) heart failure: Secondary | ICD-10-CM | POA: Diagnosis not present

## 2023-08-14 DIAGNOSIS — L97812 Non-pressure chronic ulcer of other part of right lower leg with fat layer exposed: Secondary | ICD-10-CM | POA: Diagnosis not present

## 2023-08-14 DIAGNOSIS — Z993 Dependence on wheelchair: Secondary | ICD-10-CM | POA: Diagnosis not present

## 2023-08-14 DIAGNOSIS — L97822 Non-pressure chronic ulcer of other part of left lower leg with fat layer exposed: Secondary | ICD-10-CM | POA: Diagnosis not present

## 2023-08-14 DIAGNOSIS — L89610 Pressure ulcer of right heel, unstageable: Secondary | ICD-10-CM | POA: Insufficient documentation

## 2023-08-14 DIAGNOSIS — L89620 Pressure ulcer of left heel, unstageable: Secondary | ICD-10-CM | POA: Insufficient documentation

## 2023-08-14 DIAGNOSIS — L89214 Pressure ulcer of right hip, stage 4: Secondary | ICD-10-CM | POA: Diagnosis not present

## 2023-08-14 DIAGNOSIS — M86172 Other acute osteomyelitis, left ankle and foot: Secondary | ICD-10-CM | POA: Diagnosis not present

## 2023-08-14 DIAGNOSIS — I890 Lymphedema, not elsewhere classified: Secondary | ICD-10-CM | POA: Diagnosis not present

## 2023-08-14 DIAGNOSIS — E1151 Type 2 diabetes mellitus with diabetic peripheral angiopathy without gangrene: Secondary | ICD-10-CM | POA: Insufficient documentation

## 2023-08-14 DIAGNOSIS — Z7401 Bed confinement status: Secondary | ICD-10-CM | POA: Insufficient documentation

## 2023-08-14 DIAGNOSIS — L89220 Pressure ulcer of left hip, unstageable: Secondary | ICD-10-CM | POA: Diagnosis not present

## 2023-08-14 DIAGNOSIS — E11622 Type 2 diabetes mellitus with other skin ulcer: Secondary | ICD-10-CM | POA: Insufficient documentation

## 2023-08-14 DIAGNOSIS — E785 Hyperlipidemia, unspecified: Secondary | ICD-10-CM | POA: Insufficient documentation

## 2023-08-14 DIAGNOSIS — Z7984 Long term (current) use of oral hypoglycemic drugs: Secondary | ICD-10-CM | POA: Insufficient documentation

## 2023-08-14 DIAGNOSIS — I110 Hypertensive heart disease with heart failure: Secondary | ICD-10-CM | POA: Diagnosis not present

## 2023-08-14 DIAGNOSIS — I872 Venous insufficiency (chronic) (peripheral): Secondary | ICD-10-CM | POA: Insufficient documentation

## 2023-08-14 DIAGNOSIS — M1990 Unspecified osteoarthritis, unspecified site: Secondary | ICD-10-CM | POA: Insufficient documentation

## 2023-08-14 DIAGNOSIS — E1140 Type 2 diabetes mellitus with diabetic neuropathy, unspecified: Secondary | ICD-10-CM | POA: Insufficient documentation

## 2023-08-21 ENCOUNTER — Encounter: Payer: PPO | Admitting: Internal Medicine

## 2023-08-21 DIAGNOSIS — L89894 Pressure ulcer of other site, stage 4: Secondary | ICD-10-CM | POA: Diagnosis not present

## 2023-08-21 DIAGNOSIS — L89892 Pressure ulcer of other site, stage 2: Secondary | ICD-10-CM | POA: Diagnosis not present

## 2023-08-21 DIAGNOSIS — L89623 Pressure ulcer of left heel, stage 3: Secondary | ICD-10-CM | POA: Diagnosis not present

## 2023-08-21 DIAGNOSIS — L89214 Pressure ulcer of right hip, stage 4: Secondary | ICD-10-CM | POA: Diagnosis not present

## 2023-08-21 DIAGNOSIS — E11622 Type 2 diabetes mellitus with other skin ulcer: Secondary | ICD-10-CM | POA: Diagnosis not present

## 2023-08-21 DIAGNOSIS — L89890 Pressure ulcer of other site, unstageable: Secondary | ICD-10-CM | POA: Diagnosis not present

## 2023-08-21 DIAGNOSIS — L89220 Pressure ulcer of left hip, unstageable: Secondary | ICD-10-CM | POA: Diagnosis not present

## 2023-08-21 DIAGNOSIS — L89613 Pressure ulcer of right heel, stage 3: Secondary | ICD-10-CM | POA: Diagnosis not present

## 2023-08-28 ENCOUNTER — Encounter: Payer: PPO | Admitting: Internal Medicine

## 2023-08-28 DIAGNOSIS — L89220 Pressure ulcer of left hip, unstageable: Secondary | ICD-10-CM | POA: Diagnosis not present

## 2023-08-28 DIAGNOSIS — E11622 Type 2 diabetes mellitus with other skin ulcer: Secondary | ICD-10-CM | POA: Diagnosis not present

## 2023-08-29 DIAGNOSIS — I10 Essential (primary) hypertension: Secondary | ICD-10-CM | POA: Diagnosis not present

## 2023-08-29 DIAGNOSIS — Z0000 Encounter for general adult medical examination without abnormal findings: Secondary | ICD-10-CM | POA: Diagnosis not present

## 2023-08-29 DIAGNOSIS — E8809 Other disorders of plasma-protein metabolism, not elsewhere classified: Secondary | ICD-10-CM | POA: Diagnosis not present

## 2023-08-29 DIAGNOSIS — L89623 Pressure ulcer of left heel, stage 3: Secondary | ICD-10-CM | POA: Diagnosis not present

## 2023-08-29 DIAGNOSIS — D649 Anemia, unspecified: Secondary | ICD-10-CM | POA: Diagnosis not present

## 2023-08-29 DIAGNOSIS — E1165 Type 2 diabetes mellitus with hyperglycemia: Secondary | ICD-10-CM | POA: Diagnosis not present

## 2023-08-29 DIAGNOSIS — L89613 Pressure ulcer of right heel, stage 3: Secondary | ICD-10-CM | POA: Diagnosis not present

## 2023-08-29 DIAGNOSIS — R399 Unspecified symptoms and signs involving the genitourinary system: Secondary | ICD-10-CM | POA: Diagnosis not present

## 2023-08-29 DIAGNOSIS — R8290 Unspecified abnormal findings in urine: Secondary | ICD-10-CM | POA: Diagnosis not present

## 2023-08-29 DIAGNOSIS — L89890 Pressure ulcer of other site, unstageable: Secondary | ICD-10-CM | POA: Diagnosis not present

## 2023-08-29 DIAGNOSIS — L89894 Pressure ulcer of other site, stage 4: Secondary | ICD-10-CM | POA: Diagnosis not present

## 2023-08-30 NOTE — Progress Notes (Signed)
wraps. Use non-bordered dressing only. Secured With Compression Wrap Compression Stockings Add-Ons Electronic Signature(s) Signed: 08/29/2023 5:20:54 PM By: Betha Loa Signed: 08/30/2023 11:41:18 AM By: Yevonne Pax RN Entered By: Betha Loa on 08/28/2023 16:58:22 Ebert, [name] ([id number]) [id number] Page 9 of 17 -------------------------------------------------------------------------------- Wound Assessment Details Patient Name: Date of Service: [facility], [name] 08/28/2023 3:30 PM Medical Record Number: [id number] Patient Account Number: [id number] Date of Birth/Sex: Treating RN: 07-Aug-1941 (82 y.o. [name] Primary Care Ivee Poellnitz: Barbette Reichmann Other Clinician: Betha Loa Referring Echo Propp: Treating Deshon Hsiao/Extender: [name], [name], Vishwanath Weeks in Treatment: 48 Wound Status Wound Number: 13 Primary Pressure Ulcer Etiology: Wound Location: Left Calcaneus Wound Open Wounding Event: Pressure Injury Status: Date Acquired: 11/28/2022 Comorbid Cataracts, Lymphedema, Hypertension, Peripheral Arterial Disease, Weeks Of Treatment: 38 History: Peripheral Venous Disease, Type II Diabetes, Osteoarthritis, Clustered Wound: No Neuropathy Photos Wound Measurements Length: (cm) Width: (cm) Depth: (cm) Area: (cm) Volume: (cm) 0.2 % Reduction in Area: 96.8% 0.7 % Reduction in Volume: 93.6% 0.2 Epithelialization: None 0.11 0.022 Wound Description Classification: Category/Stage III Wound Margin: Flat and Intact Exudate Amount: Medium Exudate Type: Serosanguineous Exudate Color: red, brown Foul Odor After Cleansing: No Slough/Fibrino Yes Wound Bed Granulation Amount: Medium (34-66%) Exposed  Structure Granulation Quality: Pink Fascia Exposed: No Necrotic Amount: Medium (34-66%) Fat Layer (Subcutaneous Tissue) Exposed: Yes Necrotic Quality: Eschar, Adherent Slough Tendon Exposed: No Muscle Exposed: No Joint Exposed: No Bone Exposed: No Treatment Notes Wound #13 (Calcaneus) Wound Laterality: Left Cleanser Vashe 5.8 (oz) Discharge Instruction: damp to dry daily Ammons, [name] ([id number]) [id number] Page 10 of 17 Peri-Wound Care Topical Primary Dressing Secondary Dressing ABD Pad 5x9 (in/in) Discharge Instruction: Cover with ABD pad Kerlix 4.5 x 4.1 (in/yd) Discharge Instruction: Apply Kerlix 4.5 x 4.1 (in/yd) as instructed Secured With ACE WRAP - 35M ACE Elastic Bandage With VELCRO Brand Closure, 4 (in) Compression Wrap Compression Stockings Add-Ons Electronic Signature(s) Signed: 08/29/2023 5:20:54 PM By: Betha Loa Signed: 08/30/2023 11:41:18 AM By: Yevonne Pax RN Entered By: Betha Loa on 08/28/2023 16:59:33 -------------------------------------------------------------------------------- Wound Assessment Details Patient Name: Date of Service: [facility], [name] 08/28/2023 3:30 PM Medical Record Number: [id number] Patient Account Number: [id number] Date of Birth/Sex: Treating RN: 1941/10/28 (82 y.o. [name] Primary Care Jamesmichael Shadd: Barbette Reichmann Other Clinician: Betha Loa Referring Oney Folz: Treating Jema Deegan/Extender: [name], [name], Vishwanath Weeks in Treatment: 48 Wound Status Wound Number: 14 Primary Diabetic Wound/Ulcer of the Lower Extremity Etiology: Wound Location: Right, Distal, Medial Lower Leg Wound Open Wounding Event: Gradually Appeared Status: Date Acquired: 03/10/2023 Comorbid Cataracts, Lymphedema, Hypertension, Peripheral Arterial Disease, Weeks Of Treatment: 24 History: Peripheral Venous Disease, Type II Diabetes, Osteoarthritis, Clustered Wound: Yes  Neuropathy Photos Wound Measurements Length: (cm) 7.2 Width: (cm) 2.5 Depth: (cm) 0.1 Area: (cm) 14.137 Brugger, [name] ([id number]) Volume: (cm) 1.414 % Reduction in Area: -157.1% % Reduction in Volume: -157.1% Epithelialization: None [id number] Page 11 of 17 Wound Description Classification: Grade 2 Exudate Amount: Large Exudate Type: Serosanguineous Exudate Color: red, brown Foul Odor After Cleansing: No Slough/Fibrino Yes Wound Bed Granulation Amount: Large (67-100%) Exposed Structure Granulation Quality: Red Fascia Exposed: No Necrotic Amount: Small (1-33%) Fat Layer (Subcutaneous Tissue) Exposed: Yes Necrotic Quality: Adherent Slough Tendon Exposed: No Muscle Exposed: No Joint Exposed: No Bone Exposed: No Treatment Notes Wound #14 (Lower Leg) Wound Laterality: Right, Medial, Distal Cleanser Vashe 5.8 (oz) Discharge Instruction: damp to dry daily Peri-Wound Care Topical Primary Dressing Secondary Dressing ABD Pad  wraps. Use non-bordered dressing only. Secured With Compression Wrap Compression Stockings Add-Ons Electronic Signature(s) Signed: 08/29/2023 5:20:54 PM By: Betha Loa Signed: 08/30/2023 11:41:18 AM By: Yevonne Pax RN Entered By: Betha Loa on 08/28/2023 16:58:22 Ebert, [name] ([id number]) [id number] Page 9 of 17 -------------------------------------------------------------------------------- Wound Assessment Details Patient Name: Date of Service: [facility], [name] 08/28/2023 3:30 PM Medical Record Number: [id number] Patient Account Number: [id number] Date of Birth/Sex: Treating RN: 07-Aug-1941 (82 y.o. [name] Primary Care Ivee Poellnitz: Barbette Reichmann Other Clinician: Betha Loa Referring Echo Propp: Treating Deshon Hsiao/Extender: [name], [name], Vishwanath Weeks in Treatment: 48 Wound Status Wound Number: 13 Primary Pressure Ulcer Etiology: Wound Location: Left Calcaneus Wound Open Wounding Event: Pressure Injury Status: Date Acquired: 11/28/2022 Comorbid Cataracts, Lymphedema, Hypertension, Peripheral Arterial Disease, Weeks Of Treatment: 38 History: Peripheral Venous Disease, Type II Diabetes, Osteoarthritis, Clustered Wound: No Neuropathy Photos Wound Measurements Length: (cm) Width: (cm) Depth: (cm) Area: (cm) Volume: (cm) 0.2 % Reduction in Area: 96.8% 0.7 % Reduction in Volume: 93.6% 0.2 Epithelialization: None 0.11 0.022 Wound Description Classification: Category/Stage III Wound Margin: Flat and Intact Exudate Amount: Medium Exudate Type: Serosanguineous Exudate Color: red, brown Foul Odor After Cleansing: No Slough/Fibrino Yes Wound Bed Granulation Amount: Medium (34-66%) Exposed  Structure Granulation Quality: Pink Fascia Exposed: No Necrotic Amount: Medium (34-66%) Fat Layer (Subcutaneous Tissue) Exposed: Yes Necrotic Quality: Eschar, Adherent Slough Tendon Exposed: No Muscle Exposed: No Joint Exposed: No Bone Exposed: No Treatment Notes Wound #13 (Calcaneus) Wound Laterality: Left Cleanser Vashe 5.8 (oz) Discharge Instruction: damp to dry daily Ammons, [name] ([id number]) [id number] Page 10 of 17 Peri-Wound Care Topical Primary Dressing Secondary Dressing ABD Pad 5x9 (in/in) Discharge Instruction: Cover with ABD pad Kerlix 4.5 x 4.1 (in/yd) Discharge Instruction: Apply Kerlix 4.5 x 4.1 (in/yd) as instructed Secured With ACE WRAP - 35M ACE Elastic Bandage With VELCRO Brand Closure, 4 (in) Compression Wrap Compression Stockings Add-Ons Electronic Signature(s) Signed: 08/29/2023 5:20:54 PM By: Betha Loa Signed: 08/30/2023 11:41:18 AM By: Yevonne Pax RN Entered By: Betha Loa on 08/28/2023 16:59:33 -------------------------------------------------------------------------------- Wound Assessment Details Patient Name: Date of Service: [facility], [name] 08/28/2023 3:30 PM Medical Record Number: [id number] Patient Account Number: [id number] Date of Birth/Sex: Treating RN: 1941/10/28 (82 y.o. [name] Primary Care Jamesmichael Shadd: Barbette Reichmann Other Clinician: Betha Loa Referring Oney Folz: Treating Jema Deegan/Extender: [name], [name], Vishwanath Weeks in Treatment: 48 Wound Status Wound Number: 14 Primary Diabetic Wound/Ulcer of the Lower Extremity Etiology: Wound Location: Right, Distal, Medial Lower Leg Wound Open Wounding Event: Gradually Appeared Status: Date Acquired: 03/10/2023 Comorbid Cataracts, Lymphedema, Hypertension, Peripheral Arterial Disease, Weeks Of Treatment: 24 History: Peripheral Venous Disease, Type II Diabetes, Osteoarthritis, Clustered Wound: Yes  Neuropathy Photos Wound Measurements Length: (cm) 7.2 Width: (cm) 2.5 Depth: (cm) 0.1 Area: (cm) 14.137 Brugger, [name] ([id number]) Volume: (cm) 1.414 % Reduction in Area: -157.1% % Reduction in Volume: -157.1% Epithelialization: None [id number] Page 11 of 17 Wound Description Classification: Grade 2 Exudate Amount: Large Exudate Type: Serosanguineous Exudate Color: red, brown Foul Odor After Cleansing: No Slough/Fibrino Yes Wound Bed Granulation Amount: Large (67-100%) Exposed Structure Granulation Quality: Red Fascia Exposed: No Necrotic Amount: Small (1-33%) Fat Layer (Subcutaneous Tissue) Exposed: Yes Necrotic Quality: Adherent Slough Tendon Exposed: No Muscle Exposed: No Joint Exposed: No Bone Exposed: No Treatment Notes Wound #14 (Lower Leg) Wound Laterality: Right, Medial, Distal Cleanser Vashe 5.8 (oz) Discharge Instruction: damp to dry daily Peri-Wound Care Topical Primary Dressing Secondary Dressing ABD Pad  wraps. Use non-bordered dressing only. Secured With Compression Wrap Compression Stockings Add-Ons Electronic Signature(s) Signed: 08/29/2023 5:20:54 PM By: Betha Loa Signed: 08/30/2023 11:41:18 AM By: Yevonne Pax RN Entered By: Betha Loa on 08/28/2023 16:58:22 Ebert, [name] ([id number]) [id number] Page 9 of 17 -------------------------------------------------------------------------------- Wound Assessment Details Patient Name: Date of Service: [facility], [name] 08/28/2023 3:30 PM Medical Record Number: [id number] Patient Account Number: [id number] Date of Birth/Sex: Treating RN: 07-Aug-1941 (82 y.o. [name] Primary Care Ivee Poellnitz: Barbette Reichmann Other Clinician: Betha Loa Referring Echo Propp: Treating Deshon Hsiao/Extender: [name], [name], Vishwanath Weeks in Treatment: 48 Wound Status Wound Number: 13 Primary Pressure Ulcer Etiology: Wound Location: Left Calcaneus Wound Open Wounding Event: Pressure Injury Status: Date Acquired: 11/28/2022 Comorbid Cataracts, Lymphedema, Hypertension, Peripheral Arterial Disease, Weeks Of Treatment: 38 History: Peripheral Venous Disease, Type II Diabetes, Osteoarthritis, Clustered Wound: No Neuropathy Photos Wound Measurements Length: (cm) Width: (cm) Depth: (cm) Area: (cm) Volume: (cm) 0.2 % Reduction in Area: 96.8% 0.7 % Reduction in Volume: 93.6% 0.2 Epithelialization: None 0.11 0.022 Wound Description Classification: Category/Stage III Wound Margin: Flat and Intact Exudate Amount: Medium Exudate Type: Serosanguineous Exudate Color: red, brown Foul Odor After Cleansing: No Slough/Fibrino Yes Wound Bed Granulation Amount: Medium (34-66%) Exposed  Structure Granulation Quality: Pink Fascia Exposed: No Necrotic Amount: Medium (34-66%) Fat Layer (Subcutaneous Tissue) Exposed: Yes Necrotic Quality: Eschar, Adherent Slough Tendon Exposed: No Muscle Exposed: No Joint Exposed: No Bone Exposed: No Treatment Notes Wound #13 (Calcaneus) Wound Laterality: Left Cleanser Vashe 5.8 (oz) Discharge Instruction: damp to dry daily Ammons, [name] ([id number]) [id number] Page 10 of 17 Peri-Wound Care Topical Primary Dressing Secondary Dressing ABD Pad 5x9 (in/in) Discharge Instruction: Cover with ABD pad Kerlix 4.5 x 4.1 (in/yd) Discharge Instruction: Apply Kerlix 4.5 x 4.1 (in/yd) as instructed Secured With ACE WRAP - 35M ACE Elastic Bandage With VELCRO Brand Closure, 4 (in) Compression Wrap Compression Stockings Add-Ons Electronic Signature(s) Signed: 08/29/2023 5:20:54 PM By: Betha Loa Signed: 08/30/2023 11:41:18 AM By: Yevonne Pax RN Entered By: Betha Loa on 08/28/2023 16:59:33 -------------------------------------------------------------------------------- Wound Assessment Details Patient Name: Date of Service: [facility], [name] 08/28/2023 3:30 PM Medical Record Number: [id number] Patient Account Number: [id number] Date of Birth/Sex: Treating RN: 1941/10/28 (82 y.o. [name] Primary Care Jamesmichael Shadd: Barbette Reichmann Other Clinician: Betha Loa Referring Oney Folz: Treating Jema Deegan/Extender: [name], [name], Vishwanath Weeks in Treatment: 48 Wound Status Wound Number: 14 Primary Diabetic Wound/Ulcer of the Lower Extremity Etiology: Wound Location: Right, Distal, Medial Lower Leg Wound Open Wounding Event: Gradually Appeared Status: Date Acquired: 03/10/2023 Comorbid Cataracts, Lymphedema, Hypertension, Peripheral Arterial Disease, Weeks Of Treatment: 24 History: Peripheral Venous Disease, Type II Diabetes, Osteoarthritis, Clustered Wound: Yes  Neuropathy Photos Wound Measurements Length: (cm) 7.2 Width: (cm) 2.5 Depth: (cm) 0.1 Area: (cm) 14.137 Brugger, [name] ([id number]) Volume: (cm) 1.414 % Reduction in Area: -157.1% % Reduction in Volume: -157.1% Epithelialization: None [id number] Page 11 of 17 Wound Description Classification: Grade 2 Exudate Amount: Large Exudate Type: Serosanguineous Exudate Color: red, brown Foul Odor After Cleansing: No Slough/Fibrino Yes Wound Bed Granulation Amount: Large (67-100%) Exposed Structure Granulation Quality: Red Fascia Exposed: No Necrotic Amount: Small (1-33%) Fat Layer (Subcutaneous Tissue) Exposed: Yes Necrotic Quality: Adherent Slough Tendon Exposed: No Muscle Exposed: No Joint Exposed: No Bone Exposed: No Treatment Notes Wound #14 (Lower Leg) Wound Laterality: Right, Medial, Distal Cleanser Vashe 5.8 (oz) Discharge Instruction: damp to dry daily Peri-Wound Care Topical Primary Dressing Secondary Dressing ABD Pad  [name] ([id number]) [id number] Page 14 of 17 wound with Normal Saline prior to applying a clean dressing using gauze sponges, not tissues or cotton balls. Do not  scrub or use excessive force. Pat dry using gauze sponges, not tissue or cotton balls. Peri-Wound Care Topical Santyl Collagenase Ointment, 30 (gm), tube Discharge Instruction: apply nickel thick to wound bed only Silver-Sept Hydrogel, 1.5 (oz) Tube Discharge Instruction: soak gauze with Hydrogel and pack into wound like wet to dry dressing Primary Dressing Gauze Discharge Instruction: Vashe damp to dry over Santyl Secondary Dressing (BORDER) Zetuvit Plus SILICONE BORDER Dressing 4x4 (in/in) Discharge Instruction: Please do not put silicone bordered dressings under wraps. Use non-bordered dressing only. Secured With Compression Wrap Compression Stockings Facilities manager) Signed: 08/29/2023 5:20:54 PM By: Betha Loa Signed: 08/30/2023 11:41:18 AM By: Yevonne Pax RN Entered By: Betha Loa on 08/28/2023 17:00:37 -------------------------------------------------------------------------------- Wound Assessment Details Patient Name: Date of Service: [facility], [name] 08/28/2023 3:30 PM Medical Record Number: [id number] Patient Account Number: [id number] Date of Birth/Sex: Treating RN: [birth year] (82 y.o. [name] Primary Care Zaiah Credeur: Barbette Reichmann Other Clinician: Betha Loa Referring Azaylia Fong: Treating Chardonnay Holzmann/Extender: [name], [name], Vishwanath Weeks in Treatment: 48 Wound Status Wound Number: 18 Primary Pressure Ulcer Etiology: Wound Location: Right Metatarsal head first Wound Open Wounding Event: Pressure Injury Status: Date Acquired: 07/31/2023 Comorbid Cataracts, Lymphedema, Hypertension, Peripheral Arterial Disease, Weeks Of Treatment: 4 History: Peripheral Venous Disease, Type II Diabetes, Osteoarthritis, Clustered Wound: No Neuropathy Photos KADIJA, URCIUOLI [name] ([id number]) [id number] Page 15 of 17 Wound Measurements Length: (cm) 0.8 Width: (cm) 0.7 Depth: (cm) 0.2 Area: (cm)  0.44 Volume: (cm) 0.088 % Reduction in Area: 41% % Reduction in Volume: -17.3% Epithelialization: None Wound Description Classification: Category/Stage II Exudate Amount: Medium Exudate Type: Serosanguineous Exudate Color: red, brown Foul Odor After Cleansing: No Slough/Fibrino Yes Wound Bed Granulation Amount: Medium (34-66%) Exposed Structure Granulation Quality: Red Fascia Exposed: No Necrotic Amount: Medium (34-66%) Fat Layer (Subcutaneous Tissue) Exposed: Yes Necrotic Quality: Eschar, Adherent Slough Tendon Exposed: No Muscle Exposed: No Joint Exposed: No Bone Exposed: No Treatment Notes Wound #18 (Metatarsal head first) Wound Laterality: Right Cleanser Normal Saline Discharge Instruction: Wash your hands with soap and water. Remove old dressing, discard into plastic bag and place into trash. Cleanse the wound with Normal Saline prior to applying a clean dressing using gauze sponges, not tissues or cotton balls. Do not scrub or use excessive force. Pat dry using gauze sponges, not tissue or cotton balls. Peri-Wound Care Topical Primary Dressing Gauze Discharge Instruction: Vashe damp to dry Secondary Dressing (BORDER) Zetuvit Plus SILICONE BORDER Dressing 4x4 (in/in) Discharge Instruction: Please do not put silicone bordered dressings under wraps. Use non-bordered dressing only. Secured With Compression Wrap Compression Stockings Add-Ons Electronic Signature(s) Signed: 08/29/2023 5:20:54 PM By: Betha Loa Signed: 08/30/2023 11:41:18 AM By: Yevonne Pax RN Entered By: Betha Loa on 08/28/2023 17:01:02 -------------------------------------------------------------------------------- Wound Assessment Details Patient Name: Date of Service: [facility], [name] 08/28/2023 3:30 PM Medical Record Number: [id number] Patient Account Number: [id number] [name], [name] ([id number]) [id number] Page 16 of 17 Date of Birth/Sex: Treating  RN: 1941-10-17 (82 y.o. [name] Primary Care Florette Thai: Barbette Reichmann Other Clinician: Betha Loa Referring Gennifer Potenza: Treating Kristion Holifield/Extender: [name], [name], Vishwanath Weeks in Treatment: 48 Wound Status Wound Number: 6 Primary Pressure Ulcer Etiology: Wound Location: Right Calcaneus Wound Open Wounding Event: Pressure Injury Status: Date Acquired: 07/23/2022 Comorbid Cataracts, Lymphedema, Hypertension, Peripheral Arterial Disease, Weeks  [name], [name] ([id number]) [id number] Page 1 of 17 Visit Report for 08/28/2023 Arrival Information Details Patient Name: Date of Service: [facility], [name]. 08/28/2023 3:30 PM Medical Record Number: [id number] Patient Account Number: [id number] Date of Birth/Sex: Treating RN: 01-09-1941 (82 y.o. [name] Primary Care Cayleen Benjamin: Barbette Reichmann Other Clinician: Betha Loa Referring Gennesis Hogland: Treating Railynn Ballo/Extender: [name], [name], Vishwanath Weeks in Treatment: 48 Visit Information History Since Last Visit All ordered tests and consults were completed: No Patient Arrived: Wheel Chair Added or deleted any medications: No Arrival Time: 16:04 Any new allergies or adverse reactions: No Transfer Assistance: Manual Had a fall or experienced change in No Patient Identification Verified: Yes activities of daily living that may affect Secondary Verification Process Completed: Yes risk of falls: Patient Requires Transmission-Based Precautions: No Signs or symptoms of abuse/neglect since last visito No Patient Has Alerts: Yes Hospitalized since last visit: No Patient Alerts: DM II Implantable device outside of the clinic excluding No ABI R 1.30 TBI 1.12 cellular tissue based products placed in the center ABI [name] 1.27 TBI 1.21 since last visit: Has Dressing in Place as Prescribed: Yes Pain Present Now: Yes Electronic Signature(s) Signed: 08/29/2023 5:20:54 PM By: Betha Loa Entered By: Betha Loa on 08/28/2023 16:04:42 -------------------------------------------------------------------------------- Clinic Level of Care Assessment Details Patient Name: Date of Service: [facility], [name] 08/28/2023 3:30 PM Medical Record Number: [id number] Patient Account Number: [id number] Date of Birth/Sex: Treating RN: Dec 19, 1940 (82 y.o. [name] Primary Care Dabria Wadas: Barbette Reichmann Other Clinician: Betha Loa Referring Chivon Lepage: Treating Ura Hausen/Extender: [name], [name], Vishwanath Weeks in Treatment: 48 Clinic Level of Care Assessment Items TOOL 1 Quantity Score []  - 0 Use when EandM and Procedure is performed on INITIAL visit ASSESSMENTS - Nursing Assessment / Reassessment []  - 0 General Physical Exam (combine w/ comprehensive assessment (listed just below) when performed on new pt. evals) []  - 0 Comprehensive Assessment (HX, ROS, Risk Assessments, Wounds Hx, etc.) Pannone, [name] ([id number]) [id number] Page 2 of 17 ASSESSMENTS - Wound and Skin Assessment / Reassessment []  - 0 Dermatologic / Skin Assessment (not related to wound area) ASSESSMENTS - Ostomy and/or Continence Assessment and Care []  - 0 Incontinence Assessment and Management []  - 0 Ostomy Care Assessment and Management (repouching, etc.) PROCESS - Coordination of Care []  - 0 Simple Patient / Family Education for ongoing care []  - 0 Complex (extensive) Patient / Family Education for ongoing care []  - 0 Staff obtains Chiropractor, Records, T Results / Process Orders est []  - 0 Staff telephones HHA, Nursing Homes / Clarify orders / etc []  - 0 Routine Transfer to another Facility (non-emergent condition) []  - 0 Routine Hospital Admission (non-emergent condition) []  - 0 New Admissions / Manufacturing engineer / Ordering NPWT Apligraf, etc. , []  - 0 Emergency Hospital Admission (emergent condition) PROCESS - Special Needs []  - 0 Pediatric / Minor Patient Management []  - 0 Isolation Patient Management []  - 0 Hearing / Language / Visual special needs []  - 0 Assessment of Community assistance (transportation, D/C planning, etc.) []  - 0 Additional assistance / Altered mentation []  - 0 Support Surface(s) Assessment (bed, cushion, seat, etc.) INTERVENTIONS - Miscellaneous []  - 0 External ear exam []  - 0 Patient Transfer (multiple staff / Nurse, adult / Similar  devices) []  - 0 Simple Staple / Suture removal (25 or less) []  - 0 Complex Staple / Suture removal (26 or more) []  - 0 Hypo/Hyperglycemic Management (  Of Treatment: 48 History: Peripheral Venous Disease, Type II Diabetes, Osteoarthritis, Clustered Wound: No Neuropathy Photos Wound Measurements Length: (cm) 2 Width: (cm) 2 Depth: (cm) 0.2 Area: (cm) 3.142 Volume: (cm) 0.628 % Reduction in Area: 66.7% % Reduction in Volume: 77.8% Epithelialization: None Wound Description Classification: Category/Stage III Wound Margin: Distinct, outline attached Exudate Amount: Medium Exudate Type: Serosanguineous Exudate Color: red, brown Foul Odor After Cleansing: No Slough/Fibrino Yes Wound Bed Granulation Amount: Medium (34-66%) Exposed Structure Granulation Quality: Red, Pink Fascia Exposed: No Necrotic Amount: Medium (34-66%) Fat Layer (Subcutaneous Tissue) Exposed: Yes Necrotic Quality: Eschar, Adherent Slough Tendon Exposed: No Muscle Exposed: No Joint Exposed: No Bone Exposed: No Treatment Notes Wound #6 (Calcaneus) Wound Laterality: Right Cleanser Vashe 5.8 (oz) Discharge Instruction: damp to dry daily over Santyl Peri-Wound Care Topical Primary Dressing Secondary Dressing ABD Pad 5x9 (in/in) Discharge Instruction: Cover with ABD pad Kerlix 4.5 x 4.1 (in/yd) Discharge Instruction: Apply Kerlix 4.5 x 4.1 (in/yd) as instructed Secured With ACE WRAP - 10M ACE Elastic Bandage With VELCRO Brand Closure, 4 (in) Compression Wrap Walsworth, [name] ([id number]) [id number] Page 17 of 17 Compression  Stockings Add-Ons Electronic Signature(s) Signed: 08/29/2023 5:20:54 PM By: Betha Loa Signed: 08/30/2023 11:41:18 AM By: Yevonne Pax RN Entered By: Betha Loa on 08/28/2023 17:01:22 -------------------------------------------------------------------------------- Vitals Details Patient Name: Date of Service: [facility], [name] 08/28/2023 3:30 PM Medical Record Number: [id number] Patient Account Number: [id number] Date of Birth/Sex: Treating RN: 1941/08/18 (82 y.o. [name] Primary Care Ayad Nieman: Barbette Reichmann Other Clinician: Betha Loa Referring Mionna Advincula: Treating Shrihaan Porzio/Extender: [name], [name], Vishwanath Weeks in Treatment: 48 Vital Signs Time Taken: 16:05 Temperature (F): 97.7 Weight (lbs): 98 Pulse (bpm): 87 Respiratory Rate (breaths/min): 18 Blood Pressure (mmHg): 119/61 Reference Range: 80 - 120 mg / dl Electronic Signature(s) Signed: 08/29/2023 5:20:54 PM By: Betha Loa Entered By: Betha Loa on 08/28/2023 16:06:01  [name] ([id number]) [id number] Page 14 of 17 wound with Normal Saline prior to applying a clean dressing using gauze sponges, not tissues or cotton balls. Do not  scrub or use excessive force. Pat dry using gauze sponges, not tissue or cotton balls. Peri-Wound Care Topical Santyl Collagenase Ointment, 30 (gm), tube Discharge Instruction: apply nickel thick to wound bed only Silver-Sept Hydrogel, 1.5 (oz) Tube Discharge Instruction: soak gauze with Hydrogel and pack into wound like wet to dry dressing Primary Dressing Gauze Discharge Instruction: Vashe damp to dry over Santyl Secondary Dressing (BORDER) Zetuvit Plus SILICONE BORDER Dressing 4x4 (in/in) Discharge Instruction: Please do not put silicone bordered dressings under wraps. Use non-bordered dressing only. Secured With Compression Wrap Compression Stockings Facilities manager) Signed: 08/29/2023 5:20:54 PM By: Betha Loa Signed: 08/30/2023 11:41:18 AM By: Yevonne Pax RN Entered By: Betha Loa on 08/28/2023 17:00:37 -------------------------------------------------------------------------------- Wound Assessment Details Patient Name: Date of Service: [facility], [name] 08/28/2023 3:30 PM Medical Record Number: [id number] Patient Account Number: [id number] Date of Birth/Sex: Treating RN: [birth year] (82 y.o. [name] Primary Care Zaiah Credeur: Barbette Reichmann Other Clinician: Betha Loa Referring Azaylia Fong: Treating Chardonnay Holzmann/Extender: [name], [name], Vishwanath Weeks in Treatment: 48 Wound Status Wound Number: 18 Primary Pressure Ulcer Etiology: Wound Location: Right Metatarsal head first Wound Open Wounding Event: Pressure Injury Status: Date Acquired: 07/31/2023 Comorbid Cataracts, Lymphedema, Hypertension, Peripheral Arterial Disease, Weeks Of Treatment: 4 History: Peripheral Venous Disease, Type II Diabetes, Osteoarthritis, Clustered Wound: No Neuropathy Photos KADIJA, URCIUOLI [name] ([id number]) [id number] Page 15 of 17 Wound Measurements Length: (cm) 0.8 Width: (cm) 0.7 Depth: (cm) 0.2 Area: (cm)  0.44 Volume: (cm) 0.088 % Reduction in Area: 41% % Reduction in Volume: -17.3% Epithelialization: None Wound Description Classification: Category/Stage II Exudate Amount: Medium Exudate Type: Serosanguineous Exudate Color: red, brown Foul Odor After Cleansing: No Slough/Fibrino Yes Wound Bed Granulation Amount: Medium (34-66%) Exposed Structure Granulation Quality: Red Fascia Exposed: No Necrotic Amount: Medium (34-66%) Fat Layer (Subcutaneous Tissue) Exposed: Yes Necrotic Quality: Eschar, Adherent Slough Tendon Exposed: No Muscle Exposed: No Joint Exposed: No Bone Exposed: No Treatment Notes Wound #18 (Metatarsal head first) Wound Laterality: Right Cleanser Normal Saline Discharge Instruction: Wash your hands with soap and water. Remove old dressing, discard into plastic bag and place into trash. Cleanse the wound with Normal Saline prior to applying a clean dressing using gauze sponges, not tissues or cotton balls. Do not scrub or use excessive force. Pat dry using gauze sponges, not tissue or cotton balls. Peri-Wound Care Topical Primary Dressing Gauze Discharge Instruction: Vashe damp to dry Secondary Dressing (BORDER) Zetuvit Plus SILICONE BORDER Dressing 4x4 (in/in) Discharge Instruction: Please do not put silicone bordered dressings under wraps. Use non-bordered dressing only. Secured With Compression Wrap Compression Stockings Add-Ons Electronic Signature(s) Signed: 08/29/2023 5:20:54 PM By: Betha Loa Signed: 08/30/2023 11:41:18 AM By: Yevonne Pax RN Entered By: Betha Loa on 08/28/2023 17:01:02 -------------------------------------------------------------------------------- Wound Assessment Details Patient Name: Date of Service: [facility], [name] 08/28/2023 3:30 PM Medical Record Number: [id number] Patient Account Number: [id number] [name], [name] ([id number]) [id number] Page 16 of 17 Date of Birth/Sex: Treating  RN: 1941-10-17 (82 y.o. [name] Primary Care Florette Thai: Barbette Reichmann Other Clinician: Betha Loa Referring Gennifer Potenza: Treating Kristion Holifield/Extender: [name], [name], Vishwanath Weeks in Treatment: 48 Wound Status Wound Number: 6 Primary Pressure Ulcer Etiology: Wound Location: Right Calcaneus Wound Open Wounding Event: Pressure Injury Status: Date Acquired: 07/23/2022 Comorbid Cataracts, Lymphedema, Hypertension, Peripheral Arterial Disease, Weeks  5x9 (in/in) Discharge Instruction: Cover with ABD pad Kerlix 4.5 x 4.1 (in/yd) Discharge Instruction: Apply Kerlix 4.5 x 4.1 (in/yd) as instructed Secured With ACE WRAP - 46M ACE Elastic Bandage With VELCRO Brand Closure, 4 (in) Compression Wrap Compression Stockings Add-Ons Electronic Signature(s) Signed: 08/29/2023 5:20:54 PM By: Betha Loa Signed: 08/30/2023 11:41:18 AM By: Yevonne Pax RN Entered By: Betha Loa on 08/28/2023 16:59:55 -------------------------------------------------------------------------------- Wound Assessment Details Patient Name: Date of Service: [facility], [name] 08/28/2023 3:30 PM Medical Record Number: [id number] Patient Account Number: [id number] Date of Birth/Sex: Treating RN: Aug 14, 1941 (82 y.o. [name] Primary Care Itzamara Casas: Barbette Reichmann Other Clinician: Betha Loa Referring Adaline Trejos: Treating Angell Pincock/Extender: [name], [name],  Vishwanath Weeks in Treatment: 48 Wound Status Wound Number: 15 Primary Pressure Ulcer Etiology: [name], [name] [name] ([id number]) [id number] Page 12 of 17 Etiology: Wound Location: Left, Anterior Lower Leg Wound Open Wounding Event: Pressure Injury Status: Date Acquired: 04/11/2023 Comorbid Cataracts, Lymphedema, Hypertension, Peripheral Arterial Disease, Weeks Of Treatment: 19 History: Peripheral Venous Disease, Type II Diabetes, Osteoarthritis, Clustered Wound: No Neuropathy Photos Wound Measurements Length: (cm) 4 Width: (cm) 2.3 Depth: (cm) 0.1 Area: (cm) 7.226 Volume: (cm) 0.723 % Reduction in Area: 67.8% % Reduction in Volume: 67.8% Epithelialization: Large (67-100%) Wound Description Classification: Category/Stage IV Exudate Amount: None Present Foul Odor After Cleansing: No Slough/Fibrino No Wound Bed Granulation Amount: None Present (0%) Exposed Structure Necrotic Amount: None Present (0%) Fascia Exposed: No Fat Layer (Subcutaneous Tissue) Exposed: Yes Tendon Exposed: Yes Muscle Exposed: No Joint Exposed: No Bone Exposed: No Treatment Notes Wound #15 (Lower Leg) Wound Laterality: Left, Anterior Cleanser Vashe 5.8 (oz) Discharge Instruction: damp to dry daily Peri-Wound Care Topical Primary Dressing Secondary Dressing ABD Pad 5x9 (in/in) Discharge Instruction: Cover with ABD pad Kerlix 4.5 x 4.1 (in/yd) Discharge Instruction: Apply Kerlix 4.5 x 4.1 (in/yd) as instructed Secured With ACE WRAP - 46M ACE Elastic Bandage With VELCRO Brand Closure, 4 (in) Compression Wrap Compression Stockings Add-Ons Electronic Signature(s) Signed: 08/29/2023 5:20:54 PM By: Betha Loa Signed: 08/30/2023 11:41:18 AM By: Yevonne Pax RN Entered By: Betha Loa on 08/28/2023 17:00:17 Siemon, [name] ([id number]) [id number] Page 13 of  17 -------------------------------------------------------------------------------- Wound Assessment Details Patient Name: Date of Service: [facility], [name] 08/28/2023 3:30 PM Medical Record Number: [id number] Patient Account Number: [id number] Date of Birth/Sex: Treating RN: 04-30-1941 (82 y.o. [name] Primary Care Shakirah Kirkey: Barbette Reichmann Other Clinician: Betha Loa Referring Kortez Murtagh: Treating Xayvion Shirah/Extender: [name], [name], Vishwanath Weeks in Treatment: 48 Wound Status Wound Number: 16 Primary Pressure Ulcer Etiology: Wound Location: Left Trochanter Wound Open Wounding Event: Pressure Injury Status: Date Acquired: 05/22/2023 Comorbid Cataracts, Lymphedema, Hypertension, Peripheral Arterial Disease, Weeks Of Treatment: 14 History: Peripheral Venous Disease, Type II Diabetes, Osteoarthritis, Clustered Wound: No Neuropathy Photos Wound Measurements Length: (cm) 2.3 Width: (cm) 1 Depth: (cm) 1.5 Area: (cm) 1.806 Volume: (cm) 2.71 % Reduction in Area: 44.5% % Reduction in Volume: -733.8% Wound Description Classification: Unstageable/Unclassified Exudate Amount: Medium Exudate Type: Serosanguineous Exudate Color: red, brown Foul Odor After Cleansing: No Slough/Fibrino Yes Wound Bed Necrotic Amount: Large (67-100%) Exposed Structure Necrotic Quality: Eschar, Adherent Slough Fascia Exposed: No Fat Layer (Subcutaneous Tissue) Exposed: Yes Tendon Exposed: No Muscle Exposed: No Joint Exposed: No Bone Exposed: No Treatment Notes Wound #16 (Trochanter) Wound Laterality: Left Cleanser Normal Saline Discharge Instruction: Wash your hands with soap and water. Remove old dressing, discard into plastic bag and place into trash. Cleanse the [facility], [name]

## 2023-09-02 ENCOUNTER — Ambulatory Visit (INDEPENDENT_AMBULATORY_CARE_PROVIDER_SITE_OTHER): Payer: PPO | Admitting: Physician Assistant

## 2023-09-02 DIAGNOSIS — R339 Retention of urine, unspecified: Secondary | ICD-10-CM

## 2023-09-02 DIAGNOSIS — Z466 Encounter for fitting and adjustment of urinary device: Secondary | ICD-10-CM

## 2023-09-02 NOTE — Progress Notes (Signed)
Cath Change/ Replacement  Patient is present today for a catheter change due to urinary retention.  19ml of water was removed from the balloon, a 16FR foley cath was removed without difficulty.  Patient was cleaned and prepped in a sterile fashion with betadine. A 16 FR foley cath was replaced into the bladder, no complications were noted. Urine return was noted 30ml and urine was yellow in color. The balloon was filled with 20ml of sterile water. A night bag was attached for drainage.  Patient tolerated well.    Performed by: Carman Ching, PA-C  Additional notes: Catheter remained in place with hyperinflated balloon last month; will continue this.  Follow up: Return in about 4 weeks (around 09/30/2023) for Catheter exchange.

## 2023-09-04 ENCOUNTER — Encounter: Payer: PPO | Admitting: Internal Medicine

## 2023-09-04 DIAGNOSIS — E039 Hypothyroidism, unspecified: Secondary | ICD-10-CM | POA: Diagnosis not present

## 2023-09-04 DIAGNOSIS — E8809 Other disorders of plasma-protein metabolism, not elsewhere classified: Secondary | ICD-10-CM | POA: Diagnosis not present

## 2023-09-04 DIAGNOSIS — L89899 Pressure ulcer of other site, unspecified stage: Secondary | ICD-10-CM | POA: Diagnosis not present

## 2023-09-04 DIAGNOSIS — I10 Essential (primary) hypertension: Secondary | ICD-10-CM | POA: Diagnosis not present

## 2023-09-04 DIAGNOSIS — Z23 Encounter for immunization: Secondary | ICD-10-CM | POA: Diagnosis not present

## 2023-09-04 DIAGNOSIS — M1712 Unilateral primary osteoarthritis, left knee: Secondary | ICD-10-CM | POA: Diagnosis not present

## 2023-09-04 DIAGNOSIS — E1165 Type 2 diabetes mellitus with hyperglycemia: Secondary | ICD-10-CM | POA: Diagnosis not present

## 2023-09-04 DIAGNOSIS — E790 Hyperuricemia without signs of inflammatory arthritis and tophaceous disease: Secondary | ICD-10-CM | POA: Diagnosis not present

## 2023-09-04 DIAGNOSIS — Z0000 Encounter for general adult medical examination without abnormal findings: Secondary | ICD-10-CM | POA: Diagnosis not present

## 2023-09-04 DIAGNOSIS — Z87442 Personal history of urinary calculi: Secondary | ICD-10-CM | POA: Diagnosis not present

## 2023-09-04 DIAGNOSIS — M1A00X0 Idiopathic chronic gout, unspecified site, without tophus (tophi): Secondary | ICD-10-CM | POA: Diagnosis not present

## 2023-09-04 DIAGNOSIS — E11622 Type 2 diabetes mellitus with other skin ulcer: Secondary | ICD-10-CM | POA: Diagnosis not present

## 2023-09-04 DIAGNOSIS — R7309 Other abnormal glucose: Secondary | ICD-10-CM | POA: Diagnosis not present

## 2023-09-04 DIAGNOSIS — D649 Anemia, unspecified: Secondary | ICD-10-CM | POA: Diagnosis not present

## 2023-09-04 DIAGNOSIS — L89220 Pressure ulcer of left hip, unstageable: Secondary | ICD-10-CM | POA: Diagnosis not present

## 2023-09-08 NOTE — Progress Notes (Signed)
tissues or cotton balls. Do not scrub or use excessive force. Pat dry using gauze sponges, not tissue or cotton balls. Prim Dressing: Gauze 1 x Per Day/30 Days ary Discharge Instructions: Vashe damp to dry Secondary Dressing: (BORDER) Zetuvit Plus SILICONE BORDER Dressing 4x4 (in/in) (Home Health) 1 x Per Day/30 Days Discharge Instructions: Please do not put silicone bordered dressings under wraps. Use non-bordered dressing only. WOUND #6: - Calcaneus Wound Laterality: Right Cleanser: Vashe 5.8 (oz) (Home Health) 1 x Per Day/30 Days Discharge Instructions: damp to dry daily over Santyl Secondary Dressing: ABD Pad 5x9 (in/in) 1 x Per Day/30 Days Discharge Instructions: Cover with ABD pad Secondary Dressing: Kerlix  4.5 x 4.1 (in/yd) 1 x Per Day/30 Days Discharge Instructions: Apply Kerlix 4.5 x 4.1 (in/yd) as instructed Secured With: ACE WRAP - 60M ACE Elastic Bandage With VELCRO Brand Closure, 4 (in) 1 x Per Day/30 Days 1. I am continuing with the Vashe equivalent wet-to-dry on the lower extremity wounds all of them look better today. This includes the difficult area on the right first metatarsal head both heels. 2. I am continuing with the same dressing to the hips this week but next week we will switch to a hydrogel collagen wet-to-dry I would like to see if I can get some granulation tissue over the bone on the right 3. Still considering a wound VAC to one of the greater trochanter wounds Electronic Signature(s) Signed: 08/28/2023 4:42:07 PM By: Baltazar Najjar MD Entered By: Baltazar Najjar on 08/28/2023 13:39:48 SuperBill Details -------------------------------------------------------------------------------- [name] ([id number]) [id number] Page 12 of 12 Patient Name: Date of Service: [facility], [name] 08/28/2023 Medical Record Number: [id number] Patient Account Number: [id number] Date of Birth/Sex: Treating RN: 05/18/1941 (82 y.o. [name] Primary Care Provider: Barbette Reichmann Other Clinician: Betha Loa Referring Provider: Treating Provider/Extender: [name], [name], Vishwanath Weeks in Treatment: 48 Diagnosis Coding ICD-10 Codes Code Description [id number] Non-pressure chronic ulcer of other part of left lower leg with fat layer exposed L97.812 Non-pressure chronic ulcer of other part of right lower leg with fat layer exposed I87.312 Chronic venous hypertension (idiopathic) with ulcer of left lower extremity I89.0 Lymphedema, not elsewhere classified E11.622 Type 2 diabetes mellitus with other skin ulcer L89.610 Pressure ulcer of right heel, unstageable L89.513 Pressure ulcer of right ankle, stage 3 L89.620 Pressure  ulcer of left heel, unstageable M86.172 Other acute osteomyelitis, left ankle and foot L89.214 Pressure ulcer of right hip, stage 4 L89.220 Pressure ulcer of left hip, unstageable I50.32 Chronic diastolic (congestive) heart failure Z79.84 Long term (current) use of oral hypoglycemic drugs Facility Procedures CPT4 Code Description Modifier Quantity 33295188 11042 - DEB SUBQ TISSUE 20 SQ CM/< 1 ICD-10 Diagnosis Description L89.220 Pressure ulcer of left hip, unstageable Physician Procedures Quantity CPT4 Code Description Modifier 4166063 11042 - WC PHYS SUBQ TISS 20 SQ CM 1 ICD-10 Diagnosis Description L89.220 Pressure ulcer of left hip, unstageable Electronic Signature(s) Signed: 08/28/2023 4:42:07 PM By: Baltazar Najjar MD Entered By: Baltazar Najjar on 08/28/2023 13:40:06  Problem List Details Patient Name: Date of Service: [facility], [name] 08/28/2023 3:30 PM Medical Record Number: [id number] Patient Account Number: [id number] Date of Birth/Sex: Treating RN: Apr 16, 1941 (82 y.o. [name] Primary Care Provider: Barbette Reichmann Other Clinician: Betha Loa Referring Provider: Treating Provider/Extender: [name], [name], Vishwanath Weeks in Treatment: 48 Active Problems ICD-10 Encounter Code Description Active Date MDM Diagnosis L97.822 Non-pressure chronic ulcer of other part of left lower leg with fat layer 09/26/2022 No Yes exposed L97.812 Non-pressure chronic ulcer of other part of right lower leg with fat layer 03/13/2023 No Yes exposed I87.312 Chronic venous hypertension (idiopathic) with ulcer of left lower extremity 09/26/2022 No Yes I89.0 Lymphedema, not elsewhere classified 09/26/2022 No Yes E11.622 Type 2 diabetes mellitus with other skin ulcer 09/26/2022 No Yes L89.610 Pressure ulcer of right heel, unstageable 09/26/2022 No  Yes L89.513 Pressure ulcer of right ankle, stage 3 12/05/2022 No Yes L89.620 Pressure ulcer of left heel, unstageable 12/05/2022 No Yes [name], [name] ([id number]) [id number] Page 7 of 12 [phone] Other acute osteomyelitis, left ankle and foot 10/24/2022 No Yes L89.214 Pressure ulcer of right hip, stage 4 11/21/2022 No Yes L89.220 Pressure ulcer of left hip, unstageable 05/22/2023 No Yes I50.32 Chronic diastolic (congestive) heart failure 09/26/2022 No Yes Z79.84 Long term (current) use of oral hypoglycemic drugs 03/13/2023 No Yes Inactive Problems ICD-10 Code Description Active Date Inactive Date S81.802A Unspecified open wound, left lower leg, initial encounter 05/22/2023 05/22/2023 Resolved Problems ICD-10 Code Description Active Date Resolved Date L89.320 Pressure ulcer of left buttock, unstageable 09/26/2022 09/26/2022 S91.302A Unspecified open wound, left foot, initial encounter 10/24/2022 10/24/2022 Electronic Signature(s) Signed: 08/28/2023 4:42:07 PM By: Baltazar Najjar MD Entered By: Baltazar Najjar on 08/28/2023 13:36:12 -------------------------------------------------------------------------------- Progress Note Details Patient Name: Date of Service: [facility], [name] 08/28/2023 3:30 PM Medical Record Number: [id number] Patient Account Number: [id number] Date of Birth/Sex: Treating RN: 1941-08-10 (82 y.o. [name] Primary Care Provider: Barbette Reichmann Other Clinician: Betha Loa Referring Provider: Treating Provider/Extender: [name], [name], Vishwanath Weeks in Treatment: 48 Subjective History of Present Illness (HPI) 82 year old patient who comes with a referral for bilateral lower extremity edema and a lower extremity ulceration and has been sent by her PCP [name]. I understand the patient was recently put on amoxicillin and doxycycline but could not tolerate the amoxicillin. doxycycline course was  completed. a BNP Heckard, Avarey L ([id number]) [id number] Page 8 of 12 and EKG was supposed to be normal and the patient did not have any dyspnea. the patient has been on a diuretic. The patient was also prescribed a pair of elastic compression stockings of the 20-30 mmHg pressure variety. x-ray of the right ankle was done on 09/20/2015 and showed posttraumatic and postsurgical changes of the right ankle with secondary degenerative changes of the tibiotalar joint and to a lesser degree the subtalar joint. No definite acute bony abnormalities are noted. Past medical history significant for diabetes mellitus, hypertension, hyperlipidemia, right breast cancer treated with a mastectomy in 2014. She has never smoked. 10/24/2015 -- she had delayed her vascular test because of her husband surgery but she is now ready to get him taken care of. He is also unable to use compression stockings and hence we will need to order her Juzo wraps. 10/31/2015-- was seen by Dr. Wyn Quaker on 10/28/2015. She had a left lower extremity arterial duplex done at his office a couple of years ago and that was essentially  tissues or cotton balls. Do not scrub or use excessive force. Pat dry using gauze sponges, not tissue or cotton balls. Prim Dressing: Gauze 1 x Per Day/30 Days ary Discharge Instructions: Vashe damp to dry Secondary Dressing: (BORDER) Zetuvit Plus SILICONE BORDER Dressing 4x4 (in/in) (Home Health) 1 x Per Day/30 Days Discharge Instructions: Please do not put silicone bordered dressings under wraps. Use non-bordered dressing only. WOUND #6: - Calcaneus Wound Laterality: Right Cleanser: Vashe 5.8 (oz) (Home Health) 1 x Per Day/30 Days Discharge Instructions: damp to dry daily over Santyl Secondary Dressing: ABD Pad 5x9 (in/in) 1 x Per Day/30 Days Discharge Instructions: Cover with ABD pad Secondary Dressing: Kerlix  4.5 x 4.1 (in/yd) 1 x Per Day/30 Days Discharge Instructions: Apply Kerlix 4.5 x 4.1 (in/yd) as instructed Secured With: ACE WRAP - 60M ACE Elastic Bandage With VELCRO Brand Closure, 4 (in) 1 x Per Day/30 Days 1. I am continuing with the Vashe equivalent wet-to-dry on the lower extremity wounds all of them look better today. This includes the difficult area on the right first metatarsal head both heels. 2. I am continuing with the same dressing to the hips this week but next week we will switch to a hydrogel collagen wet-to-dry I would like to see if I can get some granulation tissue over the bone on the right 3. Still considering a wound VAC to one of the greater trochanter wounds Electronic Signature(s) Signed: 08/28/2023 4:42:07 PM By: Baltazar Najjar MD Entered By: Baltazar Najjar on 08/28/2023 13:39:48 SuperBill Details -------------------------------------------------------------------------------- [name] ([id number]) [id number] Page 12 of 12 Patient Name: Date of Service: [facility], [name] 08/28/2023 Medical Record Number: [id number] Patient Account Number: [id number] Date of Birth/Sex: Treating RN: 05/18/1941 (82 y.o. [name] Primary Care Provider: Barbette Reichmann Other Clinician: Betha Loa Referring Provider: Treating Provider/Extender: [name], [name], Vishwanath Weeks in Treatment: 48 Diagnosis Coding ICD-10 Codes Code Description [id number] Non-pressure chronic ulcer of other part of left lower leg with fat layer exposed L97.812 Non-pressure chronic ulcer of other part of right lower leg with fat layer exposed I87.312 Chronic venous hypertension (idiopathic) with ulcer of left lower extremity I89.0 Lymphedema, not elsewhere classified E11.622 Type 2 diabetes mellitus with other skin ulcer L89.610 Pressure ulcer of right heel, unstageable L89.513 Pressure ulcer of right ankle, stage 3 L89.620 Pressure  ulcer of left heel, unstageable M86.172 Other acute osteomyelitis, left ankle and foot L89.214 Pressure ulcer of right hip, stage 4 L89.220 Pressure ulcer of left hip, unstageable I50.32 Chronic diastolic (congestive) heart failure Z79.84 Long term (current) use of oral hypoglycemic drugs Facility Procedures CPT4 Code Description Modifier Quantity 33295188 11042 - DEB SUBQ TISSUE 20 SQ CM/< 1 ICD-10 Diagnosis Description L89.220 Pressure ulcer of left hip, unstageable Physician Procedures Quantity CPT4 Code Description Modifier 4166063 11042 - WC PHYS SUBQ TISS 20 SQ CM 1 ICD-10 Diagnosis Description L89.220 Pressure ulcer of left hip, unstageable Electronic Signature(s) Signed: 08/28/2023 4:42:07 PM By: Baltazar Najjar MD Entered By: Baltazar Najjar on 08/28/2023 13:40:06  TED, ALEJANDRO ([id number]) [id number] Page 1 of 12 Visit Report for 08/28/2023 Debridement Details Patient Name: Date of Service: [facility], [name] 08/28/2023 3:30 PM Medical Record Number: [id number] Patient Account Number: [id number] Date of Birth/Sex: Treating RN: 30-Jun-1941 (82 y.o. [name] Primary Care Provider: Barbette Reichmann Other Clinician: Betha Loa Referring Provider: Treating Provider/Extender: [name], [name], Vishwanath Weeks in Treatment: 48 Debridement Performed for Assessment: Wound #16 Left Trochanter Performed By: Physician Maxwell Caul, MD Debridement Type: Debridement Level of Consciousness (Pre-procedure): Awake and Alert Pre-procedure Verification/Time Out Yes - 16:30 Taken: Start Time: 16:30 Percent of Wound Bed Debrided: 100% T Area Debrided (cm): otal 1.81 Tissue and other material debrided: Viable, Non-Viable, Slough, Subcutaneous, Slough Level: Skin/Subcutaneous Tissue Debridement Description: Excisional Instrument: Curette, Other : Santyl Bleeding: Minimum Hemostasis Achieved: Pressure Response to Treatment: Procedure was tolerated well Level of Consciousness (Post- Awake and Alert procedure): Post Debridement Measurements of Total Wound Length: (cm) 2.3 Stage: Unstageable/Unclassified Width: (cm) 1 Depth: (cm) 1.5 Volume: (cm) 2.71 Character of Wound/Ulcer Post Debridement: Stable Post Procedure Diagnosis Same as Pre-procedure Electronic Signature(s) Signed: 08/29/2023 5:20:54 PM By: Betha Loa Signed: 08/30/2023 11:41:18 AM By: Yevonne Pax RN Signed: 09/08/2023 11:21:49 AM By: Baltazar Najjar MD Previous Signature: 08/28/2023 4:42:07 PM Version By: Baltazar Najjar MD Entered By: Betha Loa on 08/28/2023 14:03:15 HPI Details -------------------------------------------------------------------------------- [name] ([id number])  [id number] Page 2 of 12 Patient Name: Date of Service: [facility], [name] 08/28/2023 3:30 PM Medical Record Number: [id number] Patient Account Number: [id number] Date of Birth/Sex: Treating RN: 1941-06-20 (82 y.o. [name] Primary Care Provider: Barbette Reichmann Other Clinician: Betha Loa Referring Provider: Treating Provider/Extender: [name], [name], Vishwanath Weeks in Treatment: 48 History of Present Illness HPI Description: 82 year old patient who comes with a referral for bilateral lower extremity edema and a lower extremity ulceration and has been sent by her PCP [name]. I understand the patient was recently put on amoxicillin and doxycycline but could not tolerate the amoxicillin. doxycycline course was completed. a BNP and EKG was supposed to be normal and the patient did not have any dyspnea. the patient has been on a diuretic. The patient was also prescribed a pair of elastic compression stockings of the 20-30 mmHg pressure variety. x-ray of the right ankle was done on 09/20/2015 and showed posttraumatic and postsurgical changes of the right ankle with secondary degenerative changes of the tibiotalar joint and to a lesser degree the subtalar joint. No definite acute bony abnormalities are noted. Past medical history significant for diabetes mellitus, hypertension, hyperlipidemia, right breast cancer treated with a mastectomy in 2014. She has never smoked. 10/24/2015 -- she had delayed her vascular test because of her husband surgery but she is now ready to get him taken care of. He is also unable to use compression stockings and hence we will need to order her Juzo wraps. 10/31/2015-- was seen by Dr. Wyn Quaker on 10/28/2015. She had a left lower extremity arterial duplex done at his office a couple of years ago and that was essentially normal. Today they performed a venous duplex which revealed no evidence of deep vein  thrombosis, superficial thrombophlebitis, no venous reflex was seen on the right and a minimal amount of reflux was seen on the left great saphenous vein but no significant reflux was seen. Impression was that there was a component of lymphedema present from a previous surgery and he would recommend compression stockings  TED, ALEJANDRO ([id number]) [id number] Page 1 of 12 Visit Report for 08/28/2023 Debridement Details Patient Name: Date of Service: [facility], [name] 08/28/2023 3:30 PM Medical Record Number: [id number] Patient Account Number: [id number] Date of Birth/Sex: Treating RN: 30-Jun-1941 (82 y.o. [name] Primary Care Provider: Barbette Reichmann Other Clinician: Betha Loa Referring Provider: Treating Provider/Extender: [name], [name], Vishwanath Weeks in Treatment: 48 Debridement Performed for Assessment: Wound #16 Left Trochanter Performed By: Physician Maxwell Caul, MD Debridement Type: Debridement Level of Consciousness (Pre-procedure): Awake and Alert Pre-procedure Verification/Time Out Yes - 16:30 Taken: Start Time: 16:30 Percent of Wound Bed Debrided: 100% T Area Debrided (cm): otal 1.81 Tissue and other material debrided: Viable, Non-Viable, Slough, Subcutaneous, Slough Level: Skin/Subcutaneous Tissue Debridement Description: Excisional Instrument: Curette, Other : Santyl Bleeding: Minimum Hemostasis Achieved: Pressure Response to Treatment: Procedure was tolerated well Level of Consciousness (Post- Awake and Alert procedure): Post Debridement Measurements of Total Wound Length: (cm) 2.3 Stage: Unstageable/Unclassified Width: (cm) 1 Depth: (cm) 1.5 Volume: (cm) 2.71 Character of Wound/Ulcer Post Debridement: Stable Post Procedure Diagnosis Same as Pre-procedure Electronic Signature(s) Signed: 08/29/2023 5:20:54 PM By: Betha Loa Signed: 08/30/2023 11:41:18 AM By: Yevonne Pax RN Signed: 09/08/2023 11:21:49 AM By: Baltazar Najjar MD Previous Signature: 08/28/2023 4:42:07 PM Version By: Baltazar Najjar MD Entered By: Betha Loa on 08/28/2023 14:03:15 HPI Details -------------------------------------------------------------------------------- [name] ([id number])  [id number] Page 2 of 12 Patient Name: Date of Service: [facility], [name] 08/28/2023 3:30 PM Medical Record Number: [id number] Patient Account Number: [id number] Date of Birth/Sex: Treating RN: 1941-06-20 (82 y.o. [name] Primary Care Provider: Barbette Reichmann Other Clinician: Betha Loa Referring Provider: Treating Provider/Extender: [name], [name], Vishwanath Weeks in Treatment: 48 History of Present Illness HPI Description: 82 year old patient who comes with a referral for bilateral lower extremity edema and a lower extremity ulceration and has been sent by her PCP [name]. I understand the patient was recently put on amoxicillin and doxycycline but could not tolerate the amoxicillin. doxycycline course was completed. a BNP and EKG was supposed to be normal and the patient did not have any dyspnea. the patient has been on a diuretic. The patient was also prescribed a pair of elastic compression stockings of the 20-30 mmHg pressure variety. x-ray of the right ankle was done on 09/20/2015 and showed posttraumatic and postsurgical changes of the right ankle with secondary degenerative changes of the tibiotalar joint and to a lesser degree the subtalar joint. No definite acute bony abnormalities are noted. Past medical history significant for diabetes mellitus, hypertension, hyperlipidemia, right breast cancer treated with a mastectomy in 2014. She has never smoked. 10/24/2015 -- she had delayed her vascular test because of her husband surgery but she is now ready to get him taken care of. He is also unable to use compression stockings and hence we will need to order her Juzo wraps. 10/31/2015-- was seen by Dr. Wyn Quaker on 10/28/2015. She had a left lower extremity arterial duplex done at his office a couple of years ago and that was essentially normal. Today they performed a venous duplex which revealed no evidence of deep vein  thrombosis, superficial thrombophlebitis, no venous reflex was seen on the right and a minimal amount of reflux was seen on the left great saphenous vein but no significant reflux was seen. Impression was that there was a component of lymphedema present from a previous surgery and he would recommend compression stockings  for the patient although having trouble with their insurance which apparently is [facility] 10/16; patient is doing very well. The wounds on the lower extremity even the extensive areas seem to be a lot better with healthy granulation. We have been using the [employer] equivalent of Vashe wet-to-dry. There is areas over the lateral part of the greater trochanters. The area on the left looks a lot better we have been using Santyl with backing wet to dry gauze. We have been using Vashe wet-to-dry in the right greater trochanter. I think we are in line with changing the dressing next week to both hip areas We are apparently finally making some inroads in getting her a level 3 surface for her bed Electronic Signature(s) Signed: 08/28/2023 4:42:07 PM By: Baltazar Najjar MD Entered By: Baltazar Najjar on 08/28/2023 13:37:45 Kropf, [name] ([id number]) [id number] Page 4 of 12 -------------------------------------------------------------------------------- Physical Exam Details Patient Name: Date of Service: [facility], [name] 08/28/2023 3:30 PM Medical Record Number: [id number] Patient Account Number: [id number] Date of Birth/Sex: Treating  RN: [birth year] (82 y.o. [name] Primary Care Provider: Barbette Reichmann Other Clinician: Betha Loa Referring Provider: Treating Provider/Extender: [name], [name], Vishwanath Weeks in Treatment: 48 Constitutional Sitting or standing Blood Pressure is within target range for patient.. Pulse regular and within target range for patient.Marland Kitchen Respirations regular, non-labored and within target range.. Temperature is normal and within the target range for the patient.Marland Kitchen appears in no distress. Notes Wound exam; The right greater trochanter still has exposed bone but generally healthy granulation. On the left I used a #3 curette to remove surface eschar and subcutaneous tissue hemostasis with direct pressure The wounds on her bilateral lower legs look healthy all of them granulated and most measuring smaller. Electronic Signature(s) Signed: 08/28/2023 4:42:07 PM By: Baltazar Najjar MD Entered By: Baltazar Najjar on 08/28/2023 13:38:44 -------------------------------------------------------------------------------- Physician Orders Details Patient Name: Date of Service: [facility] [name] 08/28/2023 3:30 PM Medical Record Number: [id number] Patient Account Number: [id number] Date of Birth/Sex: Treating RN: 08/26/1941 (82 y.o. [name] Primary Care Provider: Barbette Reichmann Other Clinician: Betha Loa Referring Provider: Treating Provider/Extender: [name], [name], Vishwanath Weeks in Treatment: 17 Verbal / Phone Orders: Yes Clinician: Yevonne Pax Read Back and Verified: Yes Diagnosis Coding Follow-up Appointments Return Appointment in 2 weeks. Home Health Home Health Company: - Harlin Rain, RN [phone] [facility] for wound care. May utilize formulary equivalent dressing for wound treatment orders unless otherwise specified. Home Health Nurse may visit PRN to address patients wound care needs. - left trochanter, santyl  daily all other wounds- vashe damp tp dry daily   Off-LoadingLow air-loss mattress (Group 2) Wound Treatment [name], [name] ([id number]) [id number] Page 5 of 12 Wound #11 - Trochanter Wound Laterality: Right Cleanser: Vashe 5.8 (oz) (Home Health) 1 x Per Day/30 Days Discharge Instructions: damp to dry daily Secondary Dressing: (BORDER) Zetuvit Plus SILICONE BORDER Dressing 5x5 (in/in) 1 x Per Day/30 Days Discharge Instructions: Please do not put silicone bordered dressings under wraps. Use non-bordered dressing only. Wound #13 - Calcaneus Wound Laterality: Left Cleanser: Vashe 5.8 (oz) (Home Health) 1 x Per Day/30 Days Discharge Instructions: damp to dry daily Secondary Dressing: ABD Pad 5x9 (in/in) 1 x Per Day/30 Days Discharge Instructions: Cover with ABD pad Secondary Dressing: Kerlix 4.5 x 4.1 (in/yd) 1 x Per Day/30 Days Discharge Instructions: Apply Kerlix 4.5 x 4.1 (in/yd) as instructed Secured With: ACE WRAP - 77M ACE  Problem List Details Patient Name: Date of Service: [facility], [name] 08/28/2023 3:30 PM Medical Record Number: [id number] Patient Account Number: [id number] Date of Birth/Sex: Treating RN: Apr 16, 1941 (82 y.o. [name] Primary Care Provider: Barbette Reichmann Other Clinician: Betha Loa Referring Provider: Treating Provider/Extender: [name], [name], Vishwanath Weeks in Treatment: 48 Active Problems ICD-10 Encounter Code Description Active Date MDM Diagnosis L97.822 Non-pressure chronic ulcer of other part of left lower leg with fat layer 09/26/2022 No Yes exposed L97.812 Non-pressure chronic ulcer of other part of right lower leg with fat layer 03/13/2023 No Yes exposed I87.312 Chronic venous hypertension (idiopathic) with ulcer of left lower extremity 09/26/2022 No Yes I89.0 Lymphedema, not elsewhere classified 09/26/2022 No Yes E11.622 Type 2 diabetes mellitus with other skin ulcer 09/26/2022 No Yes L89.610 Pressure ulcer of right heel, unstageable 09/26/2022 No  Yes L89.513 Pressure ulcer of right ankle, stage 3 12/05/2022 No Yes L89.620 Pressure ulcer of left heel, unstageable 12/05/2022 No Yes [name], [name] ([id number]) [id number] Page 7 of 12 [phone] Other acute osteomyelitis, left ankle and foot 10/24/2022 No Yes L89.214 Pressure ulcer of right hip, stage 4 11/21/2022 No Yes L89.220 Pressure ulcer of left hip, unstageable 05/22/2023 No Yes I50.32 Chronic diastolic (congestive) heart failure 09/26/2022 No Yes Z79.84 Long term (current) use of oral hypoglycemic drugs 03/13/2023 No Yes Inactive Problems ICD-10 Code Description Active Date Inactive Date S81.802A Unspecified open wound, left lower leg, initial encounter 05/22/2023 05/22/2023 Resolved Problems ICD-10 Code Description Active Date Resolved Date L89.320 Pressure ulcer of left buttock, unstageable 09/26/2022 09/26/2022 S91.302A Unspecified open wound, left foot, initial encounter 10/24/2022 10/24/2022 Electronic Signature(s) Signed: 08/28/2023 4:42:07 PM By: Baltazar Najjar MD Entered By: Baltazar Najjar on 08/28/2023 13:36:12 -------------------------------------------------------------------------------- Progress Note Details Patient Name: Date of Service: [facility], [name] 08/28/2023 3:30 PM Medical Record Number: [id number] Patient Account Number: [id number] Date of Birth/Sex: Treating RN: 1941-08-10 (82 y.o. [name] Primary Care Provider: Barbette Reichmann Other Clinician: Betha Loa Referring Provider: Treating Provider/Extender: [name], [name], Vishwanath Weeks in Treatment: 48 Subjective History of Present Illness (HPI) 82 year old patient who comes with a referral for bilateral lower extremity edema and a lower extremity ulceration and has been sent by her PCP [name]. I understand the patient was recently put on amoxicillin and doxycycline but could not tolerate the amoxicillin. doxycycline course was  completed. a BNP Heckard, Avarey L ([id number]) [id number].pdf Page 8 of 12 and EKG was supposed to be normal and the patient did not have any dyspnea. the patient has been on a diuretic. The patient was also prescribed a pair of elastic compression stockings of the 20-30 mmHg pressure variety. x-ray of the right ankle was done on 09/20/2015 and showed posttraumatic and postsurgical changes of the right ankle with secondary degenerative changes of the tibiotalar joint and to a lesser degree the subtalar joint. No definite acute bony abnormalities are noted. Past medical history significant for diabetes mellitus, hypertension, hyperlipidemia, right breast cancer treated with a mastectomy in 2014. She has never smoked. 10/24/2015 -- she had delayed her vascular test because of her husband surgery but she is now ready to get him taken care of. He is also unable to use compression stockings and hence we will need to order her Juzo wraps. 10/31/2015-- was seen by Dr. Wyn Quaker on 10/28/2015. She had a left lower extremity arterial duplex done at his office a couple of years ago and that was essentially  TED, ALEJANDRO ([id number]) [id number] Page 1 of 12 Visit Report for 08/28/2023 Debridement Details Patient Name: Date of Service: [facility], [name] 08/28/2023 3:30 PM Medical Record Number: [id number] Patient Account Number: [id number] Date of Birth/Sex: Treating RN: 30-Jun-1941 (82 y.o. [name] Primary Care Provider: Barbette Reichmann Other Clinician: Betha Loa Referring Provider: Treating Provider/Extender: [name], [name], Vishwanath Weeks in Treatment: 48 Debridement Performed for Assessment: Wound #16 Left Trochanter Performed By: Physician Maxwell Caul, MD Debridement Type: Debridement Level of Consciousness (Pre-procedure): Awake and Alert Pre-procedure Verification/Time Out Yes - 16:30 Taken: Start Time: 16:30 Percent of Wound Bed Debrided: 100% T Area Debrided (cm): otal 1.81 Tissue and other material debrided: Viable, Non-Viable, Slough, Subcutaneous, Slough Level: Skin/Subcutaneous Tissue Debridement Description: Excisional Instrument: Curette, Other : Santyl Bleeding: Minimum Hemostasis Achieved: Pressure Response to Treatment: Procedure was tolerated well Level of Consciousness (Post- Awake and Alert procedure): Post Debridement Measurements of Total Wound Length: (cm) 2.3 Stage: Unstageable/Unclassified Width: (cm) 1 Depth: (cm) 1.5 Volume: (cm) 2.71 Character of Wound/Ulcer Post Debridement: Stable Post Procedure Diagnosis Same as Pre-procedure Electronic Signature(s) Signed: 08/29/2023 5:20:54 PM By: Betha Loa Signed: 08/30/2023 11:41:18 AM By: Yevonne Pax RN Signed: 09/08/2023 11:21:49 AM By: Baltazar Najjar MD Previous Signature: 08/28/2023 4:42:07 PM Version By: Baltazar Najjar MD Entered By: Betha Loa on 08/28/2023 14:03:15 HPI Details -------------------------------------------------------------------------------- [name] ([id number])  [id number] Page 2 of 12 Patient Name: Date of Service: [facility], [name] 08/28/2023 3:30 PM Medical Record Number: [id number] Patient Account Number: [id number] Date of Birth/Sex: Treating RN: 1941-06-20 (82 y.o. [name] Primary Care Provider: Barbette Reichmann Other Clinician: Betha Loa Referring Provider: Treating Provider/Extender: [name], [name], Vishwanath Weeks in Treatment: 48 History of Present Illness HPI Description: 82 year old patient who comes with a referral for bilateral lower extremity edema and a lower extremity ulceration and has been sent by her PCP [name]. I understand the patient was recently put on amoxicillin and doxycycline but could not tolerate the amoxicillin. doxycycline course was completed. a BNP and EKG was supposed to be normal and the patient did not have any dyspnea. the patient has been on a diuretic. The patient was also prescribed a pair of elastic compression stockings of the 20-30 mmHg pressure variety. x-ray of the right ankle was done on 09/20/2015 and showed posttraumatic and postsurgical changes of the right ankle with secondary degenerative changes of the tibiotalar joint and to a lesser degree the subtalar joint. No definite acute bony abnormalities are noted. Past medical history significant for diabetes mellitus, hypertension, hyperlipidemia, right breast cancer treated with a mastectomy in 2014. She has never smoked. 10/24/2015 -- she had delayed her vascular test because of her husband surgery but she is now ready to get him taken care of. He is also unable to use compression stockings and hence we will need to order her Juzo wraps. 10/31/2015-- was seen by Dr. Wyn Quaker on 10/28/2015. She had a left lower extremity arterial duplex done at his office a couple of years ago and that was essentially normal. Today they performed a venous duplex which revealed no evidence of deep vein  thrombosis, superficial thrombophlebitis, no venous reflex was seen on the right and a minimal amount of reflux was seen on the left great saphenous vein but no significant reflux was seen. Impression was that there was a component of lymphedema present from a previous surgery and he would recommend compression stockings  Problem List Details Patient Name: Date of Service: [facility], [name] 08/28/2023 3:30 PM Medical Record Number: [id number] Patient Account Number: [id number] Date of Birth/Sex: Treating RN: Apr 16, 1941 (82 y.o. [name] Primary Care Provider: Barbette Reichmann Other Clinician: Betha Loa Referring Provider: Treating Provider/Extender: [name], [name], Vishwanath Weeks in Treatment: 48 Active Problems ICD-10 Encounter Code Description Active Date MDM Diagnosis L97.822 Non-pressure chronic ulcer of other part of left lower leg with fat layer 09/26/2022 No Yes exposed L97.812 Non-pressure chronic ulcer of other part of right lower leg with fat layer 03/13/2023 No Yes exposed I87.312 Chronic venous hypertension (idiopathic) with ulcer of left lower extremity 09/26/2022 No Yes I89.0 Lymphedema, not elsewhere classified 09/26/2022 No Yes E11.622 Type 2 diabetes mellitus with other skin ulcer 09/26/2022 No Yes L89.610 Pressure ulcer of right heel, unstageable 09/26/2022 No  Yes L89.513 Pressure ulcer of right ankle, stage 3 12/05/2022 No Yes L89.620 Pressure ulcer of left heel, unstageable 12/05/2022 No Yes [name], [name] ([id number]) [id number] Page 7 of 12 [phone] Other acute osteomyelitis, left ankle and foot 10/24/2022 No Yes L89.214 Pressure ulcer of right hip, stage 4 11/21/2022 No Yes L89.220 Pressure ulcer of left hip, unstageable 05/22/2023 No Yes I50.32 Chronic diastolic (congestive) heart failure 09/26/2022 No Yes Z79.84 Long term (current) use of oral hypoglycemic drugs 03/13/2023 No Yes Inactive Problems ICD-10 Code Description Active Date Inactive Date S81.802A Unspecified open wound, left lower leg, initial encounter 05/22/2023 05/22/2023 Resolved Problems ICD-10 Code Description Active Date Resolved Date L89.320 Pressure ulcer of left buttock, unstageable 09/26/2022 09/26/2022 S91.302A Unspecified open wound, left foot, initial encounter 10/24/2022 10/24/2022 Electronic Signature(s) Signed: 08/28/2023 4:42:07 PM By: Baltazar Najjar MD Entered By: Baltazar Najjar on 08/28/2023 13:36:12 -------------------------------------------------------------------------------- Progress Note Details Patient Name: Date of Service: [facility], [name] 08/28/2023 3:30 PM Medical Record Number: [id number] Patient Account Number: [id number] Date of Birth/Sex: Treating RN: 1941-08-10 (82 y.o. [name] Primary Care Provider: Barbette Reichmann Other Clinician: Betha Loa Referring Provider: Treating Provider/Extender: [name], [name], Vishwanath Weeks in Treatment: 48 Subjective History of Present Illness (HPI) 82 year old patient who comes with a referral for bilateral lower extremity edema and a lower extremity ulceration and has been sent by her PCP [name]. I understand the patient was recently put on amoxicillin and doxycycline but could not tolerate the amoxicillin. doxycycline course was  completed. a BNP Heckard, Avarey L ([id number]) [id number].pdf Page 8 of 12 and EKG was supposed to be normal and the patient did not have any dyspnea. the patient has been on a diuretic. The patient was also prescribed a pair of elastic compression stockings of the 20-30 mmHg pressure variety. x-ray of the right ankle was done on 09/20/2015 and showed posttraumatic and postsurgical changes of the right ankle with secondary degenerative changes of the tibiotalar joint and to a lesser degree the subtalar joint. No definite acute bony abnormalities are noted. Past medical history significant for diabetes mellitus, hypertension, hyperlipidemia, right breast cancer treated with a mastectomy in 2014. She has never smoked. 10/24/2015 -- she had delayed her vascular test because of her husband surgery but she is now ready to get him taken care of. He is also unable to use compression stockings and hence we will need to order her Juzo wraps. 10/31/2015-- was seen by Dr. Wyn Quaker on 10/28/2015. She had a left lower extremity arterial duplex done at his office a couple of years ago and that was essentially  for the patient although having trouble with their insurance which apparently is [facility] 10/16; patient is doing very well. The wounds on the lower extremity even the extensive areas seem to be a lot better with healthy granulation. We have been using the [employer] equivalent of Vashe wet-to-dry. There is areas over the lateral part of the greater trochanters. The area on the left looks a lot better we have been using Santyl with backing wet to dry gauze. We have been using Vashe wet-to-dry in the right greater trochanter. I think we are in line with changing the dressing next week to both hip areas We are apparently finally making some inroads in getting her a level 3 surface for her bed Electronic Signature(s) Signed: 08/28/2023 4:42:07 PM By: Baltazar Najjar MD Entered By: Baltazar Najjar on 08/28/2023 13:37:45 Kropf, [name] ([id number]) [id number] Page 4 of 12 -------------------------------------------------------------------------------- Physical Exam Details Patient Name: Date of Service: [facility], [name] 08/28/2023 3:30 PM Medical Record Number: [id number] Patient Account Number: [id number] Date of Birth/Sex: Treating  RN: [birth year] (82 y.o. [name] Primary Care Provider: Barbette Reichmann Other Clinician: Betha Loa Referring Provider: Treating Provider/Extender: [name], [name], Vishwanath Weeks in Treatment: 48 Constitutional Sitting or standing Blood Pressure is within target range for patient.. Pulse regular and within target range for patient.Marland Kitchen Respirations regular, non-labored and within target range.. Temperature is normal and within the target range for the patient.Marland Kitchen appears in no distress. Notes Wound exam; The right greater trochanter still has exposed bone but generally healthy granulation. On the left I used a #3 curette to remove surface eschar and subcutaneous tissue hemostasis with direct pressure The wounds on her bilateral lower legs look healthy all of them granulated and most measuring smaller. Electronic Signature(s) Signed: 08/28/2023 4:42:07 PM By: Baltazar Najjar MD Entered By: Baltazar Najjar on 08/28/2023 13:38:44 -------------------------------------------------------------------------------- Physician Orders Details Patient Name: Date of Service: [facility] [name] 08/28/2023 3:30 PM Medical Record Number: [id number] Patient Account Number: [id number] Date of Birth/Sex: Treating RN: 08/26/1941 (82 y.o. [name] Primary Care Provider: Barbette Reichmann Other Clinician: Betha Loa Referring Provider: Treating Provider/Extender: [name], [name], Vishwanath Weeks in Treatment: 17 Verbal / Phone Orders: Yes Clinician: Yevonne Pax Read Back and Verified: Yes Diagnosis Coding Follow-up Appointments Return Appointment in 2 weeks. Home Health Home Health Company: - Harlin Rain, RN [phone] [facility] for wound care. May utilize formulary equivalent dressing for wound treatment orders unless otherwise specified. Home Health Nurse may visit PRN to address patients wound care needs. - left trochanter, santyl  daily all other wounds- vashe damp tp dry daily   Off-LoadingLow air-loss mattress (Group 2) Wound Treatment [name], [name] ([id number]) [id number] Page 5 of 12 Wound #11 - Trochanter Wound Laterality: Right Cleanser: Vashe 5.8 (oz) (Home Health) 1 x Per Day/30 Days Discharge Instructions: damp to dry daily Secondary Dressing: (BORDER) Zetuvit Plus SILICONE BORDER Dressing 5x5 (in/in) 1 x Per Day/30 Days Discharge Instructions: Please do not put silicone bordered dressings under wraps. Use non-bordered dressing only. Wound #13 - Calcaneus Wound Laterality: Left Cleanser: Vashe 5.8 (oz) (Home Health) 1 x Per Day/30 Days Discharge Instructions: damp to dry daily Secondary Dressing: ABD Pad 5x9 (in/in) 1 x Per Day/30 Days Discharge Instructions: Cover with ABD pad Secondary Dressing: Kerlix 4.5 x 4.1 (in/yd) 1 x Per Day/30 Days Discharge Instructions: Apply Kerlix 4.5 x 4.1 (in/yd) as instructed Secured With: ACE WRAP - 77M ACE  Problem List Details Patient Name: Date of Service: [facility], [name] 08/28/2023 3:30 PM Medical Record Number: [id number] Patient Account Number: [id number] Date of Birth/Sex: Treating RN: Apr 16, 1941 (82 y.o. [name] Primary Care Provider: Barbette Reichmann Other Clinician: Betha Loa Referring Provider: Treating Provider/Extender: [name], [name], Vishwanath Weeks in Treatment: 48 Active Problems ICD-10 Encounter Code Description Active Date MDM Diagnosis L97.822 Non-pressure chronic ulcer of other part of left lower leg with fat layer 09/26/2022 No Yes exposed L97.812 Non-pressure chronic ulcer of other part of right lower leg with fat layer 03/13/2023 No Yes exposed I87.312 Chronic venous hypertension (idiopathic) with ulcer of left lower extremity 09/26/2022 No Yes I89.0 Lymphedema, not elsewhere classified 09/26/2022 No Yes E11.622 Type 2 diabetes mellitus with other skin ulcer 09/26/2022 No Yes L89.610 Pressure ulcer of right heel, unstageable 09/26/2022 No  Yes L89.513 Pressure ulcer of right ankle, stage 3 12/05/2022 No Yes L89.620 Pressure ulcer of left heel, unstageable 12/05/2022 No Yes [name], [name] ([id number]) [id number] Page 7 of 12 [phone] Other acute osteomyelitis, left ankle and foot 10/24/2022 No Yes L89.214 Pressure ulcer of right hip, stage 4 11/21/2022 No Yes L89.220 Pressure ulcer of left hip, unstageable 05/22/2023 No Yes I50.32 Chronic diastolic (congestive) heart failure 09/26/2022 No Yes Z79.84 Long term (current) use of oral hypoglycemic drugs 03/13/2023 No Yes Inactive Problems ICD-10 Code Description Active Date Inactive Date S81.802A Unspecified open wound, left lower leg, initial encounter 05/22/2023 05/22/2023 Resolved Problems ICD-10 Code Description Active Date Resolved Date L89.320 Pressure ulcer of left buttock, unstageable 09/26/2022 09/26/2022 S91.302A Unspecified open wound, left foot, initial encounter 10/24/2022 10/24/2022 Electronic Signature(s) Signed: 08/28/2023 4:42:07 PM By: Baltazar Najjar MD Entered By: Baltazar Najjar on 08/28/2023 13:36:12 -------------------------------------------------------------------------------- Progress Note Details Patient Name: Date of Service: [facility], [name] 08/28/2023 3:30 PM Medical Record Number: [id number] Patient Account Number: [id number] Date of Birth/Sex: Treating RN: 1941-08-10 (82 y.o. [name] Primary Care Provider: Barbette Reichmann Other Clinician: Betha Loa Referring Provider: Treating Provider/Extender: [name], [name], Vishwanath Weeks in Treatment: 48 Subjective History of Present Illness (HPI) 82 year old patient who comes with a referral for bilateral lower extremity edema and a lower extremity ulceration and has been sent by her PCP [name]. I understand the patient was recently put on amoxicillin and doxycycline but could not tolerate the amoxicillin. doxycycline course was  completed. a BNP Heckard, Avarey L ([id number]) [id number].pdf Page 8 of 12 and EKG was supposed to be normal and the patient did not have any dyspnea. the patient has been on a diuretic. The patient was also prescribed a pair of elastic compression stockings of the 20-30 mmHg pressure variety. x-ray of the right ankle was done on 09/20/2015 and showed posttraumatic and postsurgical changes of the right ankle with secondary degenerative changes of the tibiotalar joint and to a lesser degree the subtalar joint. No definite acute bony abnormalities are noted. Past medical history significant for diabetes mellitus, hypertension, hyperlipidemia, right breast cancer treated with a mastectomy in 2014. She has never smoked. 10/24/2015 -- she had delayed her vascular test because of her husband surgery but she is now ready to get him taken care of. He is also unable to use compression stockings and hence we will need to order her Juzo wraps. 10/31/2015-- was seen by Dr. Wyn Quaker on 10/28/2015. She had a left lower extremity arterial duplex done at his office a couple of years ago and that was essentially  Problem List Details Patient Name: Date of Service: [facility], [name] 08/28/2023 3:30 PM Medical Record Number: [id number] Patient Account Number: [id number] Date of Birth/Sex: Treating RN: Apr 16, 1941 (82 y.o. [name] Primary Care Provider: Barbette Reichmann Other Clinician: Betha Loa Referring Provider: Treating Provider/Extender: [name], [name], Vishwanath Weeks in Treatment: 48 Active Problems ICD-10 Encounter Code Description Active Date MDM Diagnosis L97.822 Non-pressure chronic ulcer of other part of left lower leg with fat layer 09/26/2022 No Yes exposed L97.812 Non-pressure chronic ulcer of other part of right lower leg with fat layer 03/13/2023 No Yes exposed I87.312 Chronic venous hypertension (idiopathic) with ulcer of left lower extremity 09/26/2022 No Yes I89.0 Lymphedema, not elsewhere classified 09/26/2022 No Yes E11.622 Type 2 diabetes mellitus with other skin ulcer 09/26/2022 No Yes L89.610 Pressure ulcer of right heel, unstageable 09/26/2022 No  Yes L89.513 Pressure ulcer of right ankle, stage 3 12/05/2022 No Yes L89.620 Pressure ulcer of left heel, unstageable 12/05/2022 No Yes [name], [name] ([id number]) [id number] Page 7 of 12 [phone] Other acute osteomyelitis, left ankle and foot 10/24/2022 No Yes L89.214 Pressure ulcer of right hip, stage 4 11/21/2022 No Yes L89.220 Pressure ulcer of left hip, unstageable 05/22/2023 No Yes I50.32 Chronic diastolic (congestive) heart failure 09/26/2022 No Yes Z79.84 Long term (current) use of oral hypoglycemic drugs 03/13/2023 No Yes Inactive Problems ICD-10 Code Description Active Date Inactive Date S81.802A Unspecified open wound, left lower leg, initial encounter 05/22/2023 05/22/2023 Resolved Problems ICD-10 Code Description Active Date Resolved Date L89.320 Pressure ulcer of left buttock, unstageable 09/26/2022 09/26/2022 S91.302A Unspecified open wound, left foot, initial encounter 10/24/2022 10/24/2022 Electronic Signature(s) Signed: 08/28/2023 4:42:07 PM By: Baltazar Najjar MD Entered By: Baltazar Najjar on 08/28/2023 13:36:12 -------------------------------------------------------------------------------- Progress Note Details Patient Name: Date of Service: [facility], [name] 08/28/2023 3:30 PM Medical Record Number: [id number] Patient Account Number: [id number] Date of Birth/Sex: Treating RN: 1941-08-10 (82 y.o. [name] Primary Care Provider: Barbette Reichmann Other Clinician: Betha Loa Referring Provider: Treating Provider/Extender: [name], [name], Vishwanath Weeks in Treatment: 48 Subjective History of Present Illness (HPI) 82 year old patient who comes with a referral for bilateral lower extremity edema and a lower extremity ulceration and has been sent by her PCP [name]. I understand the patient was recently put on amoxicillin and doxycycline but could not tolerate the amoxicillin. doxycycline course was  completed. a BNP Heckard, Avarey L ([id number]) [id number].pdf Page 8 of 12 and EKG was supposed to be normal and the patient did not have any dyspnea. the patient has been on a diuretic. The patient was also prescribed a pair of elastic compression stockings of the 20-30 mmHg pressure variety. x-ray of the right ankle was done on 09/20/2015 and showed posttraumatic and postsurgical changes of the right ankle with secondary degenerative changes of the tibiotalar joint and to a lesser degree the subtalar joint. No definite acute bony abnormalities are noted. Past medical history significant for diabetes mellitus, hypertension, hyperlipidemia, right breast cancer treated with a mastectomy in 2014. She has never smoked. 10/24/2015 -- she had delayed her vascular test because of her husband surgery but she is now ready to get him taken care of. He is also unable to use compression stockings and hence we will need to order her Juzo wraps. 10/31/2015-- was seen by Dr. Wyn Quaker on 10/28/2015. She had a left lower extremity arterial duplex done at his office a couple of years ago and that was essentially  for the patient although having trouble with their insurance which apparently is [facility] 10/16; patient is doing very well. The wounds on the lower extremity even the extensive areas seem to be a lot better with healthy granulation. We have been using the [employer] equivalent of Vashe wet-to-dry. There is areas over the lateral part of the greater trochanters. The area on the left looks a lot better we have been using Santyl with backing wet to dry gauze. We have been using Vashe wet-to-dry in the right greater trochanter. I think we are in line with changing the dressing next week to both hip areas We are apparently finally making some inroads in getting her a level 3 surface for her bed Electronic Signature(s) Signed: 08/28/2023 4:42:07 PM By: Baltazar Najjar MD Entered By: Baltazar Najjar on 08/28/2023 13:37:45 Kropf, [name] ([id number]) [id number] Page 4 of 12 -------------------------------------------------------------------------------- Physical Exam Details Patient Name: Date of Service: [facility], [name] 08/28/2023 3:30 PM Medical Record Number: [id number] Patient Account Number: [id number] Date of Birth/Sex: Treating  RN: [birth year] (82 y.o. [name] Primary Care Provider: Barbette Reichmann Other Clinician: Betha Loa Referring Provider: Treating Provider/Extender: [name], [name], Vishwanath Weeks in Treatment: 48 Constitutional Sitting or standing Blood Pressure is within target range for patient.. Pulse regular and within target range for patient.Marland Kitchen Respirations regular, non-labored and within target range.. Temperature is normal and within the target range for the patient.Marland Kitchen appears in no distress. Notes Wound exam; The right greater trochanter still has exposed bone but generally healthy granulation. On the left I used a #3 curette to remove surface eschar and subcutaneous tissue hemostasis with direct pressure The wounds on her bilateral lower legs look healthy all of them granulated and most measuring smaller. Electronic Signature(s) Signed: 08/28/2023 4:42:07 PM By: Baltazar Najjar MD Entered By: Baltazar Najjar on 08/28/2023 13:38:44 -------------------------------------------------------------------------------- Physician Orders Details Patient Name: Date of Service: [facility] [name] 08/28/2023 3:30 PM Medical Record Number: [id number] Patient Account Number: [id number] Date of Birth/Sex: Treating RN: 08/26/1941 (82 y.o. [name] Primary Care Provider: Barbette Reichmann Other Clinician: Betha Loa Referring Provider: Treating Provider/Extender: [name], [name], Vishwanath Weeks in Treatment: 17 Verbal / Phone Orders: Yes Clinician: Yevonne Pax Read Back and Verified: Yes Diagnosis Coding Follow-up Appointments Return Appointment in 2 weeks. Home Health Home Health Company: - Harlin Rain, RN [phone] [facility] for wound care. May utilize formulary equivalent dressing for wound treatment orders unless otherwise specified. Home Health Nurse may visit PRN to address patients wound care needs. - left trochanter, santyl  daily all other wounds- vashe damp tp dry daily   Off-LoadingLow air-loss mattress (Group 2) Wound Treatment [name], [name] ([id number]) [id number] Page 5 of 12 Wound #11 - Trochanter Wound Laterality: Right Cleanser: Vashe 5.8 (oz) (Home Health) 1 x Per Day/30 Days Discharge Instructions: damp to dry daily Secondary Dressing: (BORDER) Zetuvit Plus SILICONE BORDER Dressing 5x5 (in/in) 1 x Per Day/30 Days Discharge Instructions: Please do not put silicone bordered dressings under wraps. Use non-bordered dressing only. Wound #13 - Calcaneus Wound Laterality: Left Cleanser: Vashe 5.8 (oz) (Home Health) 1 x Per Day/30 Days Discharge Instructions: damp to dry daily Secondary Dressing: ABD Pad 5x9 (in/in) 1 x Per Day/30 Days Discharge Instructions: Cover with ABD pad Secondary Dressing: Kerlix 4.5 x 4.1 (in/yd) 1 x Per Day/30 Days Discharge Instructions: Apply Kerlix 4.5 x 4.1 (in/yd) as instructed Secured With: ACE WRAP - 77M ACE  for the patient although having trouble with their insurance which apparently is [facility] 10/16; patient is doing very well. The wounds on the lower extremity even the extensive areas seem to be a lot better with healthy granulation. We have been using the CVS equivalent of Vashe wet-to-dry. There is areas over the lateral part of the greater trochanters. The area on the left looks a lot better we have been using Santyl with backing wet to dry gauze. We have been using Vashe wet-to-dry in the right greater trochanter. I think we are in line with changing the dressing next week to both hip areas We are apparently finally making some inroads in getting her a level 3 surface for her bed Electronic Signature(s) Signed: 08/28/2023 4:42:07 PM By: Baltazar Najjar MD Entered By: Baltazar Najjar on 08/28/2023 13:37:45 Kropf, [name] ([id number]) [id number] Page 4 of 12 -------------------------------------------------------------------------------- Physical Exam Details Patient Name: Date of Service: [facility], [name] 08/28/2023 3:30 PM Medical Record Number: [id number] Patient Account Number: [id number] Date of Birth/Sex: Treating  RN: [birth year] (82 y.o. [name] Primary Care Provider: Barbette Reichmann Other Clinician: Betha Loa Referring Provider: Treating Provider/Extender: [name], [name], Vishwanath Weeks in Treatment: 48 Constitutional Sitting or standing Blood Pressure is within target range for patient.. Pulse regular and within target range for patient.Marland Kitchen Respirations regular, non-labored and within target range.. Temperature is normal and within the target range for the patient.Marland Kitchen appears in no distress. Notes Wound exam; The right greater trochanter still has exposed bone but generally healthy granulation. On the left I used a #3 curette to remove surface eschar and subcutaneous tissue hemostasis with direct pressure The wounds on her bilateral lower legs look healthy all of them granulated and most measuring smaller. Electronic Signature(s) Signed: 08/28/2023 4:42:07 PM By: Baltazar Najjar MD Entered By: Baltazar Najjar on 08/28/2023 13:38:44 -------------------------------------------------------------------------------- Physician Orders Details Patient Name: Date of Service: [facility] [name] 08/28/2023 3:30 PM Medical Record Number: [id number] Patient Account Number: [id number] Date of Birth/Sex: Treating RN: 08/26/1941 (82 y.o. [name] Primary Care Provider: Barbette Reichmann Other Clinician: Betha Loa Referring Provider: Treating Provider/Extender: [name], [name], Vishwanath Weeks in Treatment: 17 Verbal / Phone Orders: Yes Clinician: Yevonne Pax Read Back and Verified: Yes Diagnosis Coding Follow-up Appointments Return Appointment in 2 weeks. Home Health Home Health Company: - Harlin Rain, RN [phone] [facility] for wound care. May utilize formulary equivalent dressing for wound treatment orders unless otherwise specified. Home Health Nurse may visit PRN to address patients wound care needs. - left trochanter, santyl  daily all other wounds- vashe damp tp dry daily   Off-LoadingLow air-loss mattress (Group 2) Wound Treatment [name], [name] ([id number]) [id number] Page 5 of 12 Wound #11 - Trochanter Wound Laterality: Right Cleanser: Vashe 5.8 (oz) (Home Health) 1 x Per Day/30 Days Discharge Instructions: damp to dry daily Secondary Dressing: (BORDER) Zetuvit Plus SILICONE BORDER Dressing 5x5 (in/in) 1 x Per Day/30 Days Discharge Instructions: Please do not put silicone bordered dressings under wraps. Use non-bordered dressing only. Wound #13 - Calcaneus Wound Laterality: Left Cleanser: Vashe 5.8 (oz) (Home Health) 1 x Per Day/30 Days Discharge Instructions: damp to dry daily Secondary Dressing: ABD Pad 5x9 (in/in) 1 x Per Day/30 Days Discharge Instructions: Cover with ABD pad Secondary Dressing: Kerlix 4.5 x 4.1 (in/yd) 1 x Per Day/30 Days Discharge Instructions: Apply Kerlix 4.5 x 4.1 (in/yd) as instructed Secured With: ACE WRAP - 77M ACE  for the patient although having trouble with their insurance which apparently is [facility] 10/16; patient is doing very well. The wounds on the lower extremity even the extensive areas seem to be a lot better with healthy granulation. We have been using the CVS equivalent of Vashe wet-to-dry. There is areas over the lateral part of the greater trochanters. The area on the left looks a lot better we have been using Santyl with backing wet to dry gauze. We have been using Vashe wet-to-dry in the right greater trochanter. I think we are in line with changing the dressing next week to both hip areas We are apparently finally making some inroads in getting her a level 3 surface for her bed Electronic Signature(s) Signed: 08/28/2023 4:42:07 PM By: Baltazar Najjar MD Entered By: Baltazar Najjar on 08/28/2023 13:37:45 Kropf, [name] ([id number]) [id number] Page 4 of 12 -------------------------------------------------------------------------------- Physical Exam Details Patient Name: Date of Service: [facility], [name] 08/28/2023 3:30 PM Medical Record Number: [id number] Patient Account Number: [id number] Date of Birth/Sex: Treating  RN: [birth year] (82 y.o. [name] Primary Care Provider: Barbette Reichmann Other Clinician: Betha Loa Referring Provider: Treating Provider/Extender: [name], [name], Vishwanath Weeks in Treatment: 48 Constitutional Sitting or standing Blood Pressure is within target range for patient.. Pulse regular and within target range for patient.Marland Kitchen Respirations regular, non-labored and within target range.. Temperature is normal and within the target range for the patient.Marland Kitchen appears in no distress. Notes Wound exam; The right greater trochanter still has exposed bone but generally healthy granulation. On the left I used a #3 curette to remove surface eschar and subcutaneous tissue hemostasis with direct pressure The wounds on her bilateral lower legs look healthy all of them granulated and most measuring smaller. Electronic Signature(s) Signed: 08/28/2023 4:42:07 PM By: Baltazar Najjar MD Entered By: Baltazar Najjar on 08/28/2023 13:38:44 -------------------------------------------------------------------------------- Physician Orders Details Patient Name: Date of Service: [facility] [name] 08/28/2023 3:30 PM Medical Record Number: [id number] Patient Account Number: [id number] Date of Birth/Sex: Treating RN: 08/26/1941 (82 y.o. [name] Primary Care Provider: Barbette Reichmann Other Clinician: Betha Loa Referring Provider: Treating Provider/Extender: [name], [name], Vishwanath Weeks in Treatment: 17 Verbal / Phone Orders: Yes Clinician: Yevonne Pax Read Back and Verified: Yes Diagnosis Coding Follow-up Appointments Return Appointment in 2 weeks. Home Health Home Health Company: - Harlin Rain, RN [phone] [facility] for wound care. May utilize formulary equivalent dressing for wound treatment orders unless otherwise specified. Home Health Nurse may visit PRN to address patients wound care needs. - left trochanter, santyl  daily all other wounds- vashe damp tp dry daily   Off-LoadingLow air-loss mattress (Group 2) Wound Treatment [name], [name] ([id number]) [id number] Page 5 of 12 Wound #11 - Trochanter Wound Laterality: Right Cleanser: Vashe 5.8 (oz) (Home Health) 1 x Per Day/30 Days Discharge Instructions: damp to dry daily Secondary Dressing: (BORDER) Zetuvit Plus SILICONE BORDER Dressing 5x5 (in/in) 1 x Per Day/30 Days Discharge Instructions: Please do not put silicone bordered dressings under wraps. Use non-bordered dressing only. Wound #13 - Calcaneus Wound Laterality: Left Cleanser: Vashe 5.8 (oz) (Home Health) 1 x Per Day/30 Days Discharge Instructions: damp to dry daily Secondary Dressing: ABD Pad 5x9 (in/in) 1 x Per Day/30 Days Discharge Instructions: Cover with ABD pad Secondary Dressing: Kerlix 4.5 x 4.1 (in/yd) 1 x Per Day/30 Days Discharge Instructions: Apply Kerlix 4.5 x 4.1 (in/yd) as instructed Secured With: ACE WRAP - 77M ACE  for the patient although having trouble with their insurance which apparently is [facility] 10/16; patient is doing very well. The wounds on the lower extremity even the extensive areas seem to be a lot better with healthy granulation. We have been using the CVS equivalent of Vashe wet-to-dry. There is areas over the lateral part of the greater trochanters. The area on the left looks a lot better we have been using Santyl with backing wet to dry gauze. We have been using Vashe wet-to-dry in the right greater trochanter. I think we are in line with changing the dressing next week to both hip areas We are apparently finally making some inroads in getting her a level 3 surface for her bed Electronic Signature(s) Signed: 08/28/2023 4:42:07 PM By: Baltazar Najjar MD Entered By: Baltazar Najjar on 08/28/2023 13:37:45 Kropf, [name] ([id number]) [id number] Page 4 of 12 -------------------------------------------------------------------------------- Physical Exam Details Patient Name: Date of Service: [facility], [name] 08/28/2023 3:30 PM Medical Record Number: [id number] Patient Account Number: [id number] Date of Birth/Sex: Treating  RN: [birth year] (82 y.o. [name] Primary Care Provider: Barbette Reichmann Other Clinician: Betha Loa Referring Provider: Treating Provider/Extender: [name], [name], Vishwanath Weeks in Treatment: 48 Constitutional Sitting or standing Blood Pressure is within target range for patient.. Pulse regular and within target range for patient.Marland Kitchen Respirations regular, non-labored and within target range.. Temperature is normal and within the target range for the patient.Marland Kitchen appears in no distress. Notes Wound exam; The right greater trochanter still has exposed bone but generally healthy granulation. On the left I used a #3 curette to remove surface eschar and subcutaneous tissue hemostasis with direct pressure The wounds on her bilateral lower legs look healthy all of them granulated and most measuring smaller. Electronic Signature(s) Signed: 08/28/2023 4:42:07 PM By: Baltazar Najjar MD Entered By: Baltazar Najjar on 08/28/2023 13:38:44 -------------------------------------------------------------------------------- Physician Orders Details Patient Name: Date of Service: [facility] [name] 08/28/2023 3:30 PM Medical Record Number: [id number] Patient Account Number: [id number] Date of Birth/Sex: Treating RN: 08/26/1941 (82 y.o. [name] Primary Care Provider: Barbette Reichmann Other Clinician: Betha Loa Referring Provider: Treating Provider/Extender: [name], [name], Vishwanath Weeks in Treatment: 17 Verbal / Phone Orders: Yes Clinician: Yevonne Pax Read Back and Verified: Yes Diagnosis Coding Follow-up Appointments Return Appointment in 2 weeks. Home Health Home Health Company: - Harlin Rain, RN [phone] [facility] for wound care. May utilize formulary equivalent dressing for wound treatment orders unless otherwise specified. Home Health Nurse may visit PRN to address patients wound care needs. - left trochanter, santyl  daily all other wounds- vashe damp tp dry daily   Off-LoadingLow air-loss mattress (Group 2) Wound Treatment [name], [name] ([id number]) [id number] Page 5 of 12 Wound #11 - Trochanter Wound Laterality: Right Cleanser: Vashe 5.8 (oz) (Home Health) 1 x Per Day/30 Days Discharge Instructions: damp to dry daily Secondary Dressing: (BORDER) Zetuvit Plus SILICONE BORDER Dressing 5x5 (in/in) 1 x Per Day/30 Days Discharge Instructions: Please do not put silicone bordered dressings under wraps. Use non-bordered dressing only. Wound #13 - Calcaneus Wound Laterality: Left Cleanser: Vashe 5.8 (oz) (Home Health) 1 x Per Day/30 Days Discharge Instructions: damp to dry daily Secondary Dressing: ABD Pad 5x9 (in/in) 1 x Per Day/30 Days Discharge Instructions: Cover with ABD pad Secondary Dressing: Kerlix 4.5 x 4.1 (in/yd) 1 x Per Day/30 Days Discharge Instructions: Apply Kerlix 4.5 x 4.1 (in/yd) as instructed Secured With: ACE WRAP - 77M ACE

## 2023-09-09 NOTE — Progress Notes (Signed)
Exam Details Patient Name: Date of Service: [facility], [name] 08/21/2023 10:00 A M Medical Record Number: [id number] Patient Account Number: [id number] [name], [name] ([id number]) [id number].pdf Page 4 of 12 Date of Birth/Sex: Treating RN: 1941-05-23 (82 y.o. [name] Primary Care Provider: Barbette Reichmann Other Clinician: Betha Loa Referring Provider: Treating Provider/Extender: [name], [name], Vishwanath Weeks in Treatment: 47 Constitutional Sitting or standing Blood Pressure is within target range for patient.. Pulse regular and within target range for patient.Marland Kitchen Respirations regular, non-labored and within target range.. Temperature is normal and within the target range for the patient.. Notes Wound exam; the patient has multiple wounds including areas on both greater trochanters. The area on the right still has exposed bone but generally healthy looking granulation otherwise on the left nonviable surface but improved from last week when I did an extensive mechanical debridement we have been using Santyl and that we will continue. Most of the wounds on her bilateral lower  legs look better using Vashe wet-to-dry Electronic Signature(s) Signed: 08/21/2023 4:50:17 PM By: Baltazar Najjar MD Entered By: Baltazar Najjar on 08/21/2023 08:06:49 -------------------------------------------------------------------------------- Physician Orders Details Patient Name: Date of Service: [facility], [name] 08/21/2023 10:00 A M Medical Record Number: [id number] Patient Account Number: [id number] Date of Birth/Sex: Treating RN: 1940-12-01 (82 y.o. [name] Primary Care Provider: Barbette Reichmann Other Clinician: Betha Loa Referring Provider: Treating Provider/Extender: [name], [name], Vishwanath Weeks in Treatment: 75 Verbal / Phone Orders: Yes Clinician: Yevonne Pax Read Back and Verified: Yes Diagnosis Coding Follow-up Appointments Return Appointment in 2 weeks. Home Health Home Health Company: - Harlin Rain, RN [phone] [facility] for wound care. May utilize formulary equivalent dressing for wound treatment orders unless otherwise specified. Home Health Nurse may visit PRN to address patients wound care needs. - left trochanter, santyl daily all other wounds- vashe damp tp dry daily   Off-LoadingLow air-loss mattress (Group 2) Wound Treatment Wound #11 - Trochanter Wound Laterality: Right Cleanser: Vashe 5.8 (oz) (Home Health) 1 x Per Day/30 Days Discharge Instructions: damp to dry daily Secondary Dressing: (BORDER) Zetuvit Plus SILICONE BORDER Dressing 5x5 (in/in) 1 x Per Day/30 Days Discharge Instructions: Please do not put silicone bordered dressings under wraps. Use non-bordered dressing only. Wound #13 - Calcaneus Wound Laterality: Left Cleanser: Vashe 5.8 (oz) (Home Health) 1 x Per Day/30 Days Discharge Instructions: damp to dry daily Secondary Dressing: ABD Pad 5x9 (in/in) 1 x Per Day/30 Days Discharge Instructions: Cover with ABD pad Secondary Dressing: Kerlix 4.5 x 4.1 (in/yd) 1 x Per Day/30  Days Discharge Instructions: Apply Kerlix 4.5 x 4.1 (in/yd) as instructed [name], [name] ([id number]) [id number] Page 5 of 12 Secured With: ACE WRAP - 21M ACE Elastic Bandage With VELCRO Brand Closure, 4 (in) 1 x Per Day/30 Days Wound #14 - Lower Leg Wound Laterality: Right, Medial, Distal Cleanser: Vashe 5.8 (oz) (Home Health) 1 x Per Day/30 Days Discharge Instructions: damp to dry daily Secondary Dressing: ABD Pad 5x9 (in/in) 1 x Per Day/30 Days Discharge Instructions: Cover with ABD pad Secondary Dressing: Kerlix 4.5 x 4.1 (in/yd) 1 x Per Day/30 Days Discharge Instructions: Apply Kerlix 4.5 x 4.1 (in/yd) as instructed Secured With: ACE WRAP - 21M ACE Elastic Bandage With VELCRO Brand Closure, 4 (in) 1 x Per Day/30 Days Wound #15 - Lower Leg Wound Laterality: Left, Anterior Cleanser: Vashe 5.8 (oz) (Home Health) 1 x Per Day/30 Days Discharge Instructions: damp to dry daily  [name], [name] ([id number]) [id number] Page 1 of 12 Visit Report for 08/21/2023 Debridement Details Patient Name: Date of Service: [facility], [name] 08/21/2023 10:00 A M Medical Record Number: [id number] Patient Account Number: [id number] Date of Birth/Sex: Treating RN: October 06, 1941 (82 y.o. [name] Primary Care Provider: Barbette Reichmann Other Clinician: Betha Loa Referring Provider: Treating Provider/Extender: [name], [name], Vishwanath Weeks in Treatment: 47 Debridement Performed for Assessment: Wound #16 Left Trochanter Performed By: Physician Maxwell Caul, MD Debridement Type: Chemical/Enzymatic/Mechanical Agent Used: Santyl Level of Consciousness (Pre-procedure): Awake and Alert Pre-procedure Verification/Time Out Yes - 10:43 Taken: Start Time: 10:43 Percent of Wound Bed Debrided: Instrument: Other : Santyl Bleeding: None Response to Treatment: Procedure was tolerated well Level of Consciousness (Post- Awake and Alert procedure): Post Debridement Measurements of Total Wound Length: (cm) 1.6 Stage: Unstageable/Unclassified Width: (cm) 2 Depth: (cm) 1.2 Volume: (cm) 3.016 Character of Wound/Ulcer Post Debridement: Stable Post Procedure Diagnosis Same as Pre-procedure Electronic Signature(s) Signed: 08/21/2023 4:50:17 PM By: Baltazar Najjar MD Signed: 08/21/2023 4:52:56 PM By: Betha Loa Signed: 09/09/2023 8:01:26 AM By: Yevonne Pax RN Entered By: Betha Loa on 08/21/2023 07:44:00 -------------------------------------------------------------------------------- HPI Details Patient Name: Date of Service: [facility], [name] 08/21/2023 10:00 A M Medical Record Number: [id number] Patient Account Number: [id number] Date of Birth/Sex: Treating RN: February 19, 1941 (82 y.o. [name] Primary Care Provider: Barbette Reichmann Other Clinician: Betha Loa Referring Provider: Treating  Provider/Extender: [address], MICHA EL Allegra Grana [place], Maleyah [name] ([id number]) [id number] Page 2 of 12 Weeks in Treatment: 47 History of Present Illness HPI Description: 82 year old patient who comes with a referral for bilateral lower extremity edema and a lower extremity ulceration and has been sent by her PCP [name]. I understand the patient was recently put on amoxicillin and doxycycline but could not tolerate the amoxicillin. doxycycline course was completed. a BNP and EKG was supposed to be normal and the patient did not have any dyspnea. the patient has been on a diuretic. The patient was also prescribed a pair of elastic compression stockings of the 20-30 mmHg pressure variety. x-ray of the right ankle was done on 09/20/2015 and showed posttraumatic and postsurgical changes of the right ankle with secondary degenerative changes of the tibiotalar joint and to a lesser degree the subtalar joint. No definite acute bony abnormalities are noted. Past medical history significant for diabetes mellitus, hypertension, hyperlipidemia, right breast cancer treated with a mastectomy in 2014. She has never smoked. 10/24/2015 -- she had delayed her vascular test because of her husband surgery but she is now ready to get him taken care of. He is also unable to use compression stockings and hence we will need to order her Juzo wraps. 10/31/2015-- was seen by Dr. Wyn Quaker on 10/28/2015. She had a left lower extremity arterial duplex done at his office a couple of years ago and that was essentially normal. Today they performed a venous duplex which revealed no evidence of deep vein thrombosis, superficial thrombophlebitis, no venous reflex was seen on the right and a minimal amount of reflux was seen on the left great saphenous vein but no significant reflux was seen. Impression was that there was a component of lymphedema present from a previous surgery and he would  recommend compression stockings and leg elevation. 10/2; is a patient we are following every 2 weeks. Apparently seen by Dr. Joylene Draft since she was last here no a day additional antibiotics were added. There was  Exam Details Patient Name: Date of Service: [facility], [name] 08/21/2023 10:00 A M Medical Record Number: [id number] Patient Account Number: [id number] [name], [name] ([id number]) [id number].pdf Page 4 of 12 Date of Birth/Sex: Treating RN: 1941-05-23 (82 y.o. [name] Primary Care Provider: Barbette Reichmann Other Clinician: Betha Loa Referring Provider: Treating Provider/Extender: [name], [name], Vishwanath Weeks in Treatment: 47 Constitutional Sitting or standing Blood Pressure is within target range for patient.. Pulse regular and within target range for patient.Marland Kitchen Respirations regular, non-labored and within target range.. Temperature is normal and within the target range for the patient.. Notes Wound exam; the patient has multiple wounds including areas on both greater trochanters. The area on the right still has exposed bone but generally healthy looking granulation otherwise on the left nonviable surface but improved from last week when I did an extensive mechanical debridement we have been using Santyl and that we will continue. Most of the wounds on her bilateral lower  legs look better using Vashe wet-to-dry Electronic Signature(s) Signed: 08/21/2023 4:50:17 PM By: Baltazar Najjar MD Entered By: Baltazar Najjar on 08/21/2023 08:06:49 -------------------------------------------------------------------------------- Physician Orders Details Patient Name: Date of Service: [facility], [name] 08/21/2023 10:00 A M Medical Record Number: [id number] Patient Account Number: [id number] Date of Birth/Sex: Treating RN: 1940-12-01 (82 y.o. [name] Primary Care Provider: Barbette Reichmann Other Clinician: Betha Loa Referring Provider: Treating Provider/Extender: [name], [name], Vishwanath Weeks in Treatment: 75 Verbal / Phone Orders: Yes Clinician: Yevonne Pax Read Back and Verified: Yes Diagnosis Coding Follow-up Appointments Return Appointment in 2 weeks. Home Health Home Health Company: - Harlin Rain, RN [phone] [facility] for wound care. May utilize formulary equivalent dressing for wound treatment orders unless otherwise specified. Home Health Nurse may visit PRN to address patients wound care needs. - left trochanter, santyl daily all other wounds- vashe damp tp dry daily   Off-LoadingLow air-loss mattress (Group 2) Wound Treatment Wound #11 - Trochanter Wound Laterality: Right Cleanser: Vashe 5.8 (oz) (Home Health) 1 x Per Day/30 Days Discharge Instructions: damp to dry daily Secondary Dressing: (BORDER) Zetuvit Plus SILICONE BORDER Dressing 5x5 (in/in) 1 x Per Day/30 Days Discharge Instructions: Please do not put silicone bordered dressings under wraps. Use non-bordered dressing only. Wound #13 - Calcaneus Wound Laterality: Left Cleanser: Vashe 5.8 (oz) (Home Health) 1 x Per Day/30 Days Discharge Instructions: damp to dry daily Secondary Dressing: ABD Pad 5x9 (in/in) 1 x Per Day/30 Days Discharge Instructions: Cover with ABD pad Secondary Dressing: Kerlix 4.5 x 4.1 (in/yd) 1 x Per Day/30  Days Discharge Instructions: Apply Kerlix 4.5 x 4.1 (in/yd) as instructed [name], [name] ([id number]) [id number] Page 5 of 12 Secured With: ACE WRAP - 21M ACE Elastic Bandage With VELCRO Brand Closure, 4 (in) 1 x Per Day/30 Days Wound #14 - Lower Leg Wound Laterality: Right, Medial, Distal Cleanser: Vashe 5.8 (oz) (Home Health) 1 x Per Day/30 Days Discharge Instructions: damp to dry daily Secondary Dressing: ABD Pad 5x9 (in/in) 1 x Per Day/30 Days Discharge Instructions: Cover with ABD pad Secondary Dressing: Kerlix 4.5 x 4.1 (in/yd) 1 x Per Day/30 Days Discharge Instructions: Apply Kerlix 4.5 x 4.1 (in/yd) as instructed Secured With: ACE WRAP - 21M ACE Elastic Bandage With VELCRO Brand Closure, 4 (in) 1 x Per Day/30 Days Wound #15 - Lower Leg Wound Laterality: Left, Anterior Cleanser: Vashe 5.8 (oz) (Home Health) 1 x Per Day/30 Days Discharge Instructions: damp to dry daily  Secondary Dressing: ABD Pad 5x9 (in/in) 1 x Per Day/30 Days Discharge Instructions: Cover with ABD pad Secondary Dressing: Kerlix 4.5 x 4.1 (in/yd) 1 x Per Day/30 Days Discharge Instructions: Apply Kerlix 4.5 x 4.1 (in/yd) as instructed Secured With: ACE WRAP - 45M ACE Elastic Bandage With VELCRO Brand Closure, 4 (in) 1 x Per Day/30 Days Wound #16 - Trochanter Wound Laterality: Left Cleanser: Normal Saline (Home Health) 1 x Per Day/30 Days Discharge Instructions: Wash your hands with soap and water. Remove old dressing, discard into plastic bag and place into trash. Cleanse the wound with Normal Saline prior to applying a clean dressing using gauze sponges, not tissues or cotton balls. Do not scrub or use excessive force. Pat dry using gauze sponges, not tissue or cotton balls. Topical: Santyl Collagenase Ointment, 30 (gm), tube (Home Health) 1 x Per Day/30 Days Discharge Instructions: apply nickel thick to wound bed only Topical: Silver-Sept Hydrogel, 1.5 (oz) Tube 1 x Per Day/30  Days Discharge Instructions: soak gauze with Hydrogel and pack into wound like wet to dry dressing Prim Dressing: Gauze 1 x Per Day/30 Days ary Discharge Instructions: Vashe damp to dry over Santyl Secondary Dressing: (BORDER) Zetuvit Plus SILICONE BORDER Dressing 4x4 (in/in) (Home Health) 1 x Per Day/30 Days Discharge Instructions: Please do not put silicone bordered dressings under wraps. Use non-bordered dressing only. Wound #18 - Metatarsal head first Wound Laterality: Right Cleanser: Normal Saline (Home Health) 1 x Per Day/30 Days Discharge Instructions: Wash your hands with soap and water. Remove old dressing, discard into plastic bag and place into trash. Cleanse the wound with Normal Saline prior to applying a clean dressing using gauze sponges, not tissues or cotton balls. Do not scrub or use excessive force. Pat dry using gauze sponges, not tissue or cotton balls. Prim Dressing: Gauze 1 x Per Day/30 Days ary Discharge Instructions: Vashe damp to dry Secondary Dressing: (BORDER) Zetuvit Plus SILICONE BORDER Dressing 4x4 (in/in) (Home Health) 1 x Per Day/30 Days Discharge Instructions: Please do not put silicone bordered dressings under wraps. Use non-bordered dressing only. Wound #6 - Calcaneus Wound Laterality: Right Cleanser: Vashe 5.8 (oz) (Home Health) 1 x Per Day/30 Days Discharge Instructions: damp to dry daily over Santyl Secondary Dressing: ABD Pad 5x9 (in/in) 1 x Per Day/30 Days Discharge Instructions: Cover with ABD pad Secondary Dressing: Kerlix 4.5 x 4.1 (in/yd) 1 x Per Day/30 Days Discharge Instructions: Apply Kerlix 4.5 x 4.1 (in/yd) as instructed Secured With: ACE WRAP - 45M ACE Elastic Bandage With VELCRO Brand Closure, 4 (in) 1 x Per Day/30 Days Electronic Signature(s) Signed: 08/21/2023 4:50:17 PM By: Baltazar Najjar MD Signed: 08/21/2023 4:52:56 PM By: Betha Loa Entered By: Betha Loa on 08/21/2023 07:44:17 Adebayo, [name] ([id number])  [id number] Page 6 of 12 -------------------------------------------------------------------------------- Problem List Details Patient Name: Date of Service: [facility], [name] 08/21/2023 10:00 A M Medical Record Number: [id number] Patient Account Number: [id number] Date of Birth/Sex: Treating RN: 1941-01-09 (82 y.o. [name] Primary Care Provider: Barbette Reichmann Other Clinician: Betha Loa Referring Provider: Treating Provider/Extender: [name], [name], Vishwanath Weeks in Treatment: 45 Active Problems ICD-10 Encounter Code Description Active Date MDM Diagnosis L97.822 Non-pressure chronic ulcer of other part of left lower leg with fat layer 09/26/2022 No Yes exposed L97.812 Non-pressure chronic ulcer of other part of right lower leg with fat layer 03/13/2023 No Yes exposed I87.312 Chronic venous hypertension (idiopathic) with ulcer of left lower extremity 09/26/2022 No Yes I89.0 Lymphedema, not elsewhere  [name], [name] ([id number]) [id number] Page 1 of 12 Visit Report for 08/21/2023 Debridement Details Patient Name: Date of Service: [facility], [name] 08/21/2023 10:00 A M Medical Record Number: [id number] Patient Account Number: [id number] Date of Birth/Sex: Treating RN: October 06, 1941 (82 y.o. [name] Primary Care Provider: Barbette Reichmann Other Clinician: Betha Loa Referring Provider: Treating Provider/Extender: [name], [name], Vishwanath Weeks in Treatment: 47 Debridement Performed for Assessment: Wound #16 Left Trochanter Performed By: Physician Maxwell Caul, MD Debridement Type: Chemical/Enzymatic/Mechanical Agent Used: Santyl Level of Consciousness (Pre-procedure): Awake and Alert Pre-procedure Verification/Time Out Yes - 10:43 Taken: Start Time: 10:43 Percent of Wound Bed Debrided: Instrument: Other : Santyl Bleeding: None Response to Treatment: Procedure was tolerated well Level of Consciousness (Post- Awake and Alert procedure): Post Debridement Measurements of Total Wound Length: (cm) 1.6 Stage: Unstageable/Unclassified Width: (cm) 2 Depth: (cm) 1.2 Volume: (cm) 3.016 Character of Wound/Ulcer Post Debridement: Stable Post Procedure Diagnosis Same as Pre-procedure Electronic Signature(s) Signed: 08/21/2023 4:50:17 PM By: Baltazar Najjar MD Signed: 08/21/2023 4:52:56 PM By: Betha Loa Signed: 09/09/2023 8:01:26 AM By: Yevonne Pax RN Entered By: Betha Loa on 08/21/2023 07:44:00 -------------------------------------------------------------------------------- HPI Details Patient Name: Date of Service: [facility], [name] 08/21/2023 10:00 A M Medical Record Number: [id number] Patient Account Number: [id number] Date of Birth/Sex: Treating RN: February 19, 1941 (82 y.o. [name] Primary Care Provider: Barbette Reichmann Other Clinician: Betha Loa Referring Provider: Treating  Provider/Extender: [address], MICHA EL Allegra Grana [place], Maleyah [name] ([id number]) [id number] Page 2 of 12 Weeks in Treatment: 47 History of Present Illness HPI Description: 82 year old patient who comes with a referral for bilateral lower extremity edema and a lower extremity ulceration and has been sent by her PCP [name]. I understand the patient was recently put on amoxicillin and doxycycline but could not tolerate the amoxicillin. doxycycline course was completed. a BNP and EKG was supposed to be normal and the patient did not have any dyspnea. the patient has been on a diuretic. The patient was also prescribed a pair of elastic compression stockings of the 20-30 mmHg pressure variety. x-ray of the right ankle was done on 09/20/2015 and showed posttraumatic and postsurgical changes of the right ankle with secondary degenerative changes of the tibiotalar joint and to a lesser degree the subtalar joint. No definite acute bony abnormalities are noted. Past medical history significant for diabetes mellitus, hypertension, hyperlipidemia, right breast cancer treated with a mastectomy in 2014. She has never smoked. 10/24/2015 -- she had delayed her vascular test because of her husband surgery but she is now ready to get him taken care of. He is also unable to use compression stockings and hence we will need to order her Juzo wraps. 10/31/2015-- was seen by Dr. Wyn Quaker on 10/28/2015. She had a left lower extremity arterial duplex done at his office a couple of years ago and that was essentially normal. Today they performed a venous duplex which revealed no evidence of deep vein thrombosis, superficial thrombophlebitis, no venous reflex was seen on the right and a minimal amount of reflux was seen on the left great saphenous vein but no significant reflux was seen. Impression was that there was a component of lymphedema present from a previous surgery and he would  recommend compression stockings and leg elevation. 10/2; is a patient we are following every 2 weeks. Apparently seen by Dr. Joylene Draft since she was last here no a day additional antibiotics were added. There was  seem to be improving. 3. She is accompanied to clinic by her son who does some of the care here and I think they are doing generally a good job. 4. 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 I told him to do gentle range of motion to try and prevent gradual worsening of the contractures that will make it more difficult ultimately to care for her. 5. At some point we will be looking at different dressings here on the hips probably collagen with backing wet to dry not sure we could do a wound VAC on at least 1 of these areas but that would require some additional thought Electronic Signature(s) Signed: 08/21/2023 4:50:17 PM By: Baltazar Najjar MD Entered By: Baltazar Najjar on  08/21/2023 08:08:48 -------------------------------------------------------------------------------- SuperBill Details Patient Name: Date of Service: [facility], [name] 08/21/2023 Medical Record Number: [id number] Patient Account Number: [id number] Date of Birth/Sex: Treating RN: September 02, 1941 (82 y.o. 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 exposed I87.312 Chronic venous hypertension (idiopathic) with ulcer of left lower extremity I89.0 Lymphedema, not elsewhere classified E11.622 Type 2 diabetes mellitus with other skin ulcer L89.610 Pressure ulcer of right heel, unstageable Couvillon, Sianna [name] ([id number]) [id number] Page 12 of 12 L89.513 Pressure ulcer of right ankle, stage 3 L89.620 Pressure ulcer of left heel, unstageable M86.172 Other acute osteomyelitis, left ankle and foot L89.214 Pressure ulcer of right hip, stage 4 L89.220 Pressure ulcer of left hip, unstageable I50.32 Chronic diastolic (congestive) heart failure Z79.84 Long term (current) use of oral hypoglycemic drugs Facility Procedures : CPT4 Code: 63016010 Description: 93235 - DEBRIDE W/O ANES NON SELECT Modifier: Quantity: 1 Physician Procedures : CPT4 Code Description Modifier [id number] - WC PHYS LEVEL 3 - EST PT ICD-10 Diagnosis Description L97.822 Non-pressure chronic ulcer of other part of left lower leg with fat layer exposed L97.812 Non-pressure chronic ulcer of other part of right  lower leg with fat layer exposed L89.214 Pressure ulcer of right hip, stage 4 Quantity: 1 Electronic  Signature(s) Signed: 08/21/2023 4:50:17 PM By: Baltazar Najjar MD Entered By: Baltazar Najjar on 08/21/2023 08:09:37  Exam Details Patient Name: Date of Service: [facility], [name] 08/21/2023 10:00 A M Medical Record Number: [id number] Patient Account Number: [id number] [name], [name] ([id number]) [id number].pdf Page 4 of 12 Date of Birth/Sex: Treating RN: 1941-05-23 (82 y.o. [name] Primary Care Provider: Barbette Reichmann Other Clinician: Betha Loa Referring Provider: Treating Provider/Extender: [name], [name], Vishwanath Weeks in Treatment: 47 Constitutional Sitting or standing Blood Pressure is within target range for patient.. Pulse regular and within target range for patient.Marland Kitchen Respirations regular, non-labored and within target range.. Temperature is normal and within the target range for the patient.. Notes Wound exam; the patient has multiple wounds including areas on both greater trochanters. The area on the right still has exposed bone but generally healthy looking granulation otherwise on the left nonviable surface but improved from last week when I did an extensive mechanical debridement we have been using Santyl and that we will continue. Most of the wounds on her bilateral lower  legs look better using Vashe wet-to-dry Electronic Signature(s) Signed: 08/21/2023 4:50:17 PM By: Baltazar Najjar MD Entered By: Baltazar Najjar on 08/21/2023 08:06:49 -------------------------------------------------------------------------------- Physician Orders Details Patient Name: Date of Service: [facility], [name] 08/21/2023 10:00 A M Medical Record Number: [id number] Patient Account Number: [id number] Date of Birth/Sex: Treating RN: 1940-12-01 (82 y.o. [name] Primary Care Provider: Barbette Reichmann Other Clinician: Betha Loa Referring Provider: Treating Provider/Extender: [name], [name], Vishwanath Weeks in Treatment: 75 Verbal / Phone Orders: Yes Clinician: Yevonne Pax Read Back and Verified: Yes Diagnosis Coding Follow-up Appointments Return Appointment in 2 weeks. Home Health Home Health Company: - Harlin Rain, RN [phone] [facility] for wound care. May utilize formulary equivalent dressing for wound treatment orders unless otherwise specified. Home Health Nurse may visit PRN to address patients wound care needs. - left trochanter, santyl daily all other wounds- vashe damp tp dry daily   Off-LoadingLow air-loss mattress (Group 2) Wound Treatment Wound #11 - Trochanter Wound Laterality: Right Cleanser: Vashe 5.8 (oz) (Home Health) 1 x Per Day/30 Days Discharge Instructions: damp to dry daily Secondary Dressing: (BORDER) Zetuvit Plus SILICONE BORDER Dressing 5x5 (in/in) 1 x Per Day/30 Days Discharge Instructions: Please do not put silicone bordered dressings under wraps. Use non-bordered dressing only. Wound #13 - Calcaneus Wound Laterality: Left Cleanser: Vashe 5.8 (oz) (Home Health) 1 x Per Day/30 Days Discharge Instructions: damp to dry daily Secondary Dressing: ABD Pad 5x9 (in/in) 1 x Per Day/30 Days Discharge Instructions: Cover with ABD pad Secondary Dressing: Kerlix 4.5 x 4.1 (in/yd) 1 x Per Day/30  Days Discharge Instructions: Apply Kerlix 4.5 x 4.1 (in/yd) as instructed [name], [name] ([id number]) [id number] Page 5 of 12 Secured With: ACE WRAP - 21M ACE Elastic Bandage With VELCRO Brand Closure, 4 (in) 1 x Per Day/30 Days Wound #14 - Lower Leg Wound Laterality: Right, Medial, Distal Cleanser: Vashe 5.8 (oz) (Home Health) 1 x Per Day/30 Days Discharge Instructions: damp to dry daily Secondary Dressing: ABD Pad 5x9 (in/in) 1 x Per Day/30 Days Discharge Instructions: Cover with ABD pad Secondary Dressing: Kerlix 4.5 x 4.1 (in/yd) 1 x Per Day/30 Days Discharge Instructions: Apply Kerlix 4.5 x 4.1 (in/yd) as instructed Secured With: ACE WRAP - 21M ACE Elastic Bandage With VELCRO Brand Closure, 4 (in) 1 x Per Day/30 Days Wound #15 - Lower Leg Wound Laterality: Left, Anterior Cleanser: Vashe 5.8 (oz) (Home Health) 1 x Per Day/30 Days Discharge Instructions: damp to dry daily  Secondary Dressing: ABD Pad 5x9 (in/in) 1 x Per Day/30 Days Discharge Instructions: Cover with ABD pad Secondary Dressing: Kerlix 4.5 x 4.1 (in/yd) 1 x Per Day/30 Days Discharge Instructions: Apply Kerlix 4.5 x 4.1 (in/yd) as instructed Secured With: ACE WRAP - 45M ACE Elastic Bandage With VELCRO Brand Closure, 4 (in) 1 x Per Day/30 Days Wound #16 - Trochanter Wound Laterality: Left Cleanser: Normal Saline (Home Health) 1 x Per Day/30 Days Discharge Instructions: Wash your hands with soap and water. Remove old dressing, discard into plastic bag and place into trash. Cleanse the wound with Normal Saline prior to applying a clean dressing using gauze sponges, not tissues or cotton balls. Do not scrub or use excessive force. Pat dry using gauze sponges, not tissue or cotton balls. Topical: Santyl Collagenase Ointment, 30 (gm), tube (Home Health) 1 x Per Day/30 Days Discharge Instructions: apply nickel thick to wound bed only Topical: Silver-Sept Hydrogel, 1.5 (oz) Tube 1 x Per Day/30  Days Discharge Instructions: soak gauze with Hydrogel and pack into wound like wet to dry dressing Prim Dressing: Gauze 1 x Per Day/30 Days ary Discharge Instructions: Vashe damp to dry over Santyl Secondary Dressing: (BORDER) Zetuvit Plus SILICONE BORDER Dressing 4x4 (in/in) (Home Health) 1 x Per Day/30 Days Discharge Instructions: Please do not put silicone bordered dressings under wraps. Use non-bordered dressing only. Wound #18 - Metatarsal head first Wound Laterality: Right Cleanser: Normal Saline (Home Health) 1 x Per Day/30 Days Discharge Instructions: Wash your hands with soap and water. Remove old dressing, discard into plastic bag and place into trash. Cleanse the wound with Normal Saline prior to applying a clean dressing using gauze sponges, not tissues or cotton balls. Do not scrub or use excessive force. Pat dry using gauze sponges, not tissue or cotton balls. Prim Dressing: Gauze 1 x Per Day/30 Days ary Discharge Instructions: Vashe damp to dry Secondary Dressing: (BORDER) Zetuvit Plus SILICONE BORDER Dressing 4x4 (in/in) (Home Health) 1 x Per Day/30 Days Discharge Instructions: Please do not put silicone bordered dressings under wraps. Use non-bordered dressing only. Wound #6 - Calcaneus Wound Laterality: Right Cleanser: Vashe 5.8 (oz) (Home Health) 1 x Per Day/30 Days Discharge Instructions: damp to dry daily over Santyl Secondary Dressing: ABD Pad 5x9 (in/in) 1 x Per Day/30 Days Discharge Instructions: Cover with ABD pad Secondary Dressing: Kerlix 4.5 x 4.1 (in/yd) 1 x Per Day/30 Days Discharge Instructions: Apply Kerlix 4.5 x 4.1 (in/yd) as instructed Secured With: ACE WRAP - 45M ACE Elastic Bandage With VELCRO Brand Closure, 4 (in) 1 x Per Day/30 Days Electronic Signature(s) Signed: 08/21/2023 4:50:17 PM By: Baltazar Najjar MD Signed: 08/21/2023 4:52:56 PM By: Betha Loa Entered By: Betha Loa on 08/21/2023 07:44:17 Adebayo, [name] ([id number])  [id number] Page 6 of 12 -------------------------------------------------------------------------------- Problem List Details Patient Name: Date of Service: [facility], [name] 08/21/2023 10:00 A M Medical Record Number: [id number] Patient Account Number: [id number] Date of Birth/Sex: Treating RN: 1941-01-09 (82 y.o. [name] Primary Care Provider: Barbette Reichmann Other Clinician: Betha Loa Referring Provider: Treating Provider/Extender: [name], [name], Vishwanath Weeks in Treatment: 45 Active Problems ICD-10 Encounter Code Description Active Date MDM Diagnosis L97.822 Non-pressure chronic ulcer of other part of left lower leg with fat layer 09/26/2022 No Yes exposed L97.812 Non-pressure chronic ulcer of other part of right lower leg with fat layer 03/13/2023 No Yes exposed I87.312 Chronic venous hypertension (idiopathic) with ulcer of left lower extremity 09/26/2022 No Yes I89.0 Lymphedema, not elsewhere  classified 09/26/2022 No Yes E11.622 Type 2 diabetes mellitus with other skin ulcer 09/26/2022 No Yes L89.610 Pressure ulcer of right heel, unstageable 09/26/2022 No Yes L89.513 Pressure ulcer of right ankle, stage 3 12/05/2022 No Yes L89.620 Pressure ulcer of left heel, unstageable 12/05/2022 No Yes M86.172 Other acute osteomyelitis, left ankle and foot 10/24/2022 No Yes L89.214 Pressure ulcer of right hip, stage 4 11/21/2022 No Yes L89.220 Pressure ulcer of left hip, unstageable 05/22/2023 No Yes I50.32 Chronic diastolic (congestive) heart failure 09/26/2022 No Yes [name], [name] ([id number]) [id number] Page 7 of 12 Z79.84 Long term (current) use of oral hypoglycemic drugs 03/13/2023 No Yes Inactive Problems ICD-10 Code Description Active Date Inactive Date S81.802A Unspecified open wound, left lower leg, initial encounter 05/22/2023 05/22/2023 Resolved Problems ICD-10 Code Description Active  Date Resolved Date L89.320 Pressure ulcer of left buttock, unstageable 09/26/2022 09/26/2022 S91.302A Unspecified open wound, left foot, initial encounter 10/24/2022 10/24/2022 Electronic Signature(s) Signed: 08/21/2023 4:50:17 PM By: Baltazar Najjar MD Entered By: Baltazar Najjar on 08/21/2023 08:02:17 -------------------------------------------------------------------------------- Progress Note Details Patient Name: Date of Service: [facility], [name] 08/21/2023 10:00 A M Medical Record Number: [id number] Patient Account Number: [id number] Date of Birth/Sex: Treating RN: [birth year] (82 y.o. [name] Primary Care Provider: Barbette Reichmann Other Clinician: Betha Loa Referring Provider: Treating Provider/Extender: [name], [name], Vishwanath Weeks in Treatment: 47 Subjective History of Present Illness (HPI) 82 year old patient who comes with a referral for bilateral lower extremity edema and a lower extremity ulceration and has been sent by her PCP [name]. I understand the patient was recently put on amoxicillin and doxycycline but could not tolerate the amoxicillin. doxycycline course was completed. a BNP and EKG was supposed to be normal and the patient did not have any dyspnea. the patient has been on a diuretic. The patient was also prescribed a pair of elastic compression stockings of the 20-30 mmHg pressure variety. x-ray of the right ankle was done on 09/20/2015 and showed posttraumatic and postsurgical changes of the right ankle with secondary degenerative changes of the tibiotalar joint and to a lesser degree the subtalar joint. No definite acute bony abnormalities are noted. Past medical history significant for diabetes mellitus, hypertension, hyperlipidemia, right breast cancer treated with a mastectomy in 2014. She has never smoked. 10/24/2015 -- she had delayed her vascular test because of her husband surgery but she is now ready to get him  taken care of. He is also unable to use compression stockings and hence we will need to order her Juzo wraps. 10/31/2015-- was seen by Dr. Wyn Quaker on 10/28/2015. She had a left lower extremity arterial duplex done at his office a couple of years ago and that was essentially normal. Today they performed a venous duplex which revealed no evidence of deep vein thrombosis, superficial thrombophlebitis, no venous reflex was seen on the right and a minimal amount of reflux was seen on the left great saphenous vein but no significant reflux was seen. Impression was that there was a component of lymphedema present from a previous surgery and he would recommend compression stockings and leg elevation. 10/2; is a patient we are following every 2 weeks. Apparently seen by Dr. Joylene Draft since she was last here no a day additional antibiotics were added. There was some suggestion about US obtaining cultures and we could bring them to her attention. The patient has stage IV wound over the right greater trochanter and unstageable area on the left but it is probably  [name], [name] ([id number]) [id number] Page 1 of 12 Visit Report for 08/21/2023 Debridement Details Patient Name: Date of Service: [facility], [name] 08/21/2023 10:00 A M Medical Record Number: [id number] Patient Account Number: [id number] Date of Birth/Sex: Treating RN: October 06, 1941 (82 y.o. [name] Primary Care Provider: Barbette Reichmann Other Clinician: Betha Loa Referring Provider: Treating Provider/Extender: [name], [name], Vishwanath Weeks in Treatment: 47 Debridement Performed for Assessment: Wound #16 Left Trochanter Performed By: Physician Maxwell Caul, MD Debridement Type: Chemical/Enzymatic/Mechanical Agent Used: Santyl Level of Consciousness (Pre-procedure): Awake and Alert Pre-procedure Verification/Time Out Yes - 10:43 Taken: Start Time: 10:43 Percent of Wound Bed Debrided: Instrument: Other : Santyl Bleeding: None Response to Treatment: Procedure was tolerated well Level of Consciousness (Post- Awake and Alert procedure): Post Debridement Measurements of Total Wound Length: (cm) 1.6 Stage: Unstageable/Unclassified Width: (cm) 2 Depth: (cm) 1.2 Volume: (cm) 3.016 Character of Wound/Ulcer Post Debridement: Stable Post Procedure Diagnosis Same as Pre-procedure Electronic Signature(s) Signed: 08/21/2023 4:50:17 PM By: Baltazar Najjar MD Signed: 08/21/2023 4:52:56 PM By: Betha Loa Signed: 09/09/2023 8:01:26 AM By: Yevonne Pax RN Entered By: Betha Loa on 08/21/2023 07:44:00 -------------------------------------------------------------------------------- HPI Details Patient Name: Date of Service: [facility], [name] 08/21/2023 10:00 A M Medical Record Number: [id number] Patient Account Number: [id number] Date of Birth/Sex: Treating RN: February 19, 1941 (82 y.o. [name] Primary Care Provider: Barbette Reichmann Other Clinician: Betha Loa Referring Provider: Treating  Provider/Extender: [address], MICHA EL Allegra Grana [place], Maleyah [name] ([id number]) [id number] Page 2 of 12 Weeks in Treatment: 47 History of Present Illness HPI Description: 82 year old patient who comes with a referral for bilateral lower extremity edema and a lower extremity ulceration and has been sent by her PCP [name]. I understand the patient was recently put on amoxicillin and doxycycline but could not tolerate the amoxicillin. doxycycline course was completed. a BNP and EKG was supposed to be normal and the patient did not have any dyspnea. the patient has been on a diuretic. The patient was also prescribed a pair of elastic compression stockings of the 20-30 mmHg pressure variety. x-ray of the right ankle was done on 09/20/2015 and showed posttraumatic and postsurgical changes of the right ankle with secondary degenerative changes of the tibiotalar joint and to a lesser degree the subtalar joint. No definite acute bony abnormalities are noted. Past medical history significant for diabetes mellitus, hypertension, hyperlipidemia, right breast cancer treated with a mastectomy in 2014. She has never smoked. 10/24/2015 -- she had delayed her vascular test because of her husband surgery but she is now ready to get him taken care of. He is also unable to use compression stockings and hence we will need to order her Juzo wraps. 10/31/2015-- was seen by Dr. Wyn Quaker on 10/28/2015. She had a left lower extremity arterial duplex done at his office a couple of years ago and that was essentially normal. Today they performed a venous duplex which revealed no evidence of deep vein thrombosis, superficial thrombophlebitis, no venous reflex was seen on the right and a minimal amount of reflux was seen on the left great saphenous vein but no significant reflux was seen. Impression was that there was a component of lymphedema present from a previous surgery and he would  recommend compression stockings and leg elevation. 10/2; is a patient we are following every 2 weeks. Apparently seen by Dr. Joylene Draft since she was last here no a day additional antibiotics were added. There was  classified 09/26/2022 No Yes E11.622 Type 2 diabetes mellitus with other skin ulcer 09/26/2022 No Yes L89.610 Pressure ulcer of right heel, unstageable 09/26/2022 No Yes L89.513 Pressure ulcer of right ankle, stage 3 12/05/2022 No Yes L89.620 Pressure ulcer of left heel, unstageable 12/05/2022 No Yes M86.172 Other acute osteomyelitis, left ankle and foot 10/24/2022 No Yes L89.214 Pressure ulcer of right hip, stage 4 11/21/2022 No Yes L89.220 Pressure ulcer of left hip, unstageable 05/22/2023 No Yes I50.32 Chronic diastolic (congestive) heart failure 09/26/2022 No Yes [name], [name] ([id number]) [id number] Page 7 of 12 Z79.84 Long term (current) use of oral hypoglycemic drugs 03/13/2023 No Yes Inactive Problems ICD-10 Code Description Active Date Inactive Date S81.802A Unspecified open wound, left lower leg, initial encounter 05/22/2023 05/22/2023 Resolved Problems ICD-10 Code Description Active  Date Resolved Date L89.320 Pressure ulcer of left buttock, unstageable 09/26/2022 09/26/2022 S91.302A Unspecified open wound, left foot, initial encounter 10/24/2022 10/24/2022 Electronic Signature(s) Signed: 08/21/2023 4:50:17 PM By: Baltazar Najjar MD Entered By: Baltazar Najjar on 08/21/2023 08:02:17 -------------------------------------------------------------------------------- Progress Note Details Patient Name: Date of Service: [facility], [name] 08/21/2023 10:00 A M Medical Record Number: [id number] Patient Account Number: [id number] Date of Birth/Sex: Treating RN: [birth year] (82 y.o. [name] Primary Care Provider: Barbette Reichmann Other Clinician: Betha Loa Referring Provider: Treating Provider/Extender: [name], [name], Vishwanath Weeks in Treatment: 47 Subjective History of Present Illness (HPI) 82 year old patient who comes with a referral for bilateral lower extremity edema and a lower extremity ulceration and has been sent by her PCP [name]. I understand the patient was recently put on amoxicillin and doxycycline but could not tolerate the amoxicillin. doxycycline course was completed. a BNP and EKG was supposed to be normal and the patient did not have any dyspnea. the patient has been on a diuretic. The patient was also prescribed a pair of elastic compression stockings of the 20-30 mmHg pressure variety. x-ray of the right ankle was done on 09/20/2015 and showed posttraumatic and postsurgical changes of the right ankle with secondary degenerative changes of the tibiotalar joint and to a lesser degree the subtalar joint. No definite acute bony abnormalities are noted. Past medical history significant for diabetes mellitus, hypertension, hyperlipidemia, right breast cancer treated with a mastectomy in 2014. She has never smoked. 10/24/2015 -- she had delayed her vascular test because of her husband surgery but she is now ready to get him  taken care of. He is also unable to use compression stockings and hence we will need to order her Juzo wraps. 10/31/2015-- was seen by Dr. Wyn Quaker on 10/28/2015. She had a left lower extremity arterial duplex done at his office a couple of years ago and that was essentially normal. Today they performed a venous duplex which revealed no evidence of deep vein thrombosis, superficial thrombophlebitis, no venous reflex was seen on the right and a minimal amount of reflux was seen on the left great saphenous vein but no significant reflux was seen. Impression was that there was a component of lymphedema present from a previous surgery and he would recommend compression stockings and leg elevation. 10/2; is a patient we are following every 2 weeks. Apparently seen by Dr. Joylene Draft since she was last here no a day additional antibiotics were added. There was some suggestion about US obtaining cultures and we could bring them to her attention. The patient has stage IV wound over the right greater trochanter and unstageable area on the left but it is probably  Exam Details Patient Name: Date of Service: [facility], [name] 08/21/2023 10:00 A M Medical Record Number: [id number] Patient Account Number: [id number] [name], [name] ([id number]) [id number].pdf Page 4 of 12 Date of Birth/Sex: Treating RN: 1941-05-23 (82 y.o. [name] Primary Care Provider: Barbette Reichmann Other Clinician: Betha Loa Referring Provider: Treating Provider/Extender: [name], [name], Vishwanath Weeks in Treatment: 47 Constitutional Sitting or standing Blood Pressure is within target range for patient.. Pulse regular and within target range for patient.Marland Kitchen Respirations regular, non-labored and within target range.. Temperature is normal and within the target range for the patient.. Notes Wound exam; the patient has multiple wounds including areas on both greater trochanters. The area on the right still has exposed bone but generally healthy looking granulation otherwise on the left nonviable surface but improved from last week when I did an extensive mechanical debridement we have been using Santyl and that we will continue. Most of the wounds on her bilateral lower  legs look better using Vashe wet-to-dry Electronic Signature(s) Signed: 08/21/2023 4:50:17 PM By: Baltazar Najjar MD Entered By: Baltazar Najjar on 08/21/2023 08:06:49 -------------------------------------------------------------------------------- Physician Orders Details Patient Name: Date of Service: [facility], [name] 08/21/2023 10:00 A M Medical Record Number: [id number] Patient Account Number: [id number] Date of Birth/Sex: Treating RN: 1940-12-01 (82 y.o. [name] Primary Care Provider: Barbette Reichmann Other Clinician: Betha Loa Referring Provider: Treating Provider/Extender: [name], [name], Vishwanath Weeks in Treatment: 75 Verbal / Phone Orders: Yes Clinician: Yevonne Pax Read Back and Verified: Yes Diagnosis Coding Follow-up Appointments Return Appointment in 2 weeks. Home Health Home Health Company: - Harlin Rain, RN [phone] [facility] for wound care. May utilize formulary equivalent dressing for wound treatment orders unless otherwise specified. Home Health Nurse may visit PRN to address patients wound care needs. - left trochanter, santyl daily all other wounds- vashe damp tp dry daily   Off-LoadingLow air-loss mattress (Group 2) Wound Treatment Wound #11 - Trochanter Wound Laterality: Right Cleanser: Vashe 5.8 (oz) (Home Health) 1 x Per Day/30 Days Discharge Instructions: damp to dry daily Secondary Dressing: (BORDER) Zetuvit Plus SILICONE BORDER Dressing 5x5 (in/in) 1 x Per Day/30 Days Discharge Instructions: Please do not put silicone bordered dressings under wraps. Use non-bordered dressing only. Wound #13 - Calcaneus Wound Laterality: Left Cleanser: Vashe 5.8 (oz) (Home Health) 1 x Per Day/30 Days Discharge Instructions: damp to dry daily Secondary Dressing: ABD Pad 5x9 (in/in) 1 x Per Day/30 Days Discharge Instructions: Cover with ABD pad Secondary Dressing: Kerlix 4.5 x 4.1 (in/yd) 1 x Per Day/30  Days Discharge Instructions: Apply Kerlix 4.5 x 4.1 (in/yd) as instructed [name], [name] ([id number]) [id number] Page 5 of 12 Secured With: ACE WRAP - 21M ACE Elastic Bandage With VELCRO Brand Closure, 4 (in) 1 x Per Day/30 Days Wound #14 - Lower Leg Wound Laterality: Right, Medial, Distal Cleanser: Vashe 5.8 (oz) (Home Health) 1 x Per Day/30 Days Discharge Instructions: damp to dry daily Secondary Dressing: ABD Pad 5x9 (in/in) 1 x Per Day/30 Days Discharge Instructions: Cover with ABD pad Secondary Dressing: Kerlix 4.5 x 4.1 (in/yd) 1 x Per Day/30 Days Discharge Instructions: Apply Kerlix 4.5 x 4.1 (in/yd) as instructed Secured With: ACE WRAP - 21M ACE Elastic Bandage With VELCRO Brand Closure, 4 (in) 1 x Per Day/30 Days Wound #15 - Lower Leg Wound Laterality: Left, Anterior Cleanser: Vashe 5.8 (oz) (Home Health) 1 x Per Day/30 Days Discharge Instructions: damp to dry daily  seem to be improving. 3. She is accompanied to clinic by her son who does some of the care here and I think they are doing generally a good job. 4. 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 I told him to do gentle range of motion to try and prevent gradual worsening of the contractures that will make it more difficult ultimately to care for her. 5. At some point we will be looking at different dressings here on the hips probably collagen with backing wet to dry not sure we could do a wound VAC on at least 1 of these areas but that would require some additional thought Electronic Signature(s) Signed: 08/21/2023 4:50:17 PM By: Baltazar Najjar MD Entered By: Baltazar Najjar on  08/21/2023 08:08:48 -------------------------------------------------------------------------------- SuperBill Details Patient Name: Date of Service: [facility] [facility], [name] 08/21/2023 Medical Record Number: [id number] Patient Account Number: [id number] Date of Birth/Sex: Treating RN: September 02, 1941 (82 y.o. 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 exposed I87.312 Chronic venous hypertension (idiopathic) with ulcer of left lower extremity I89.0 Lymphedema, not elsewhere classified E11.622 Type 2 diabetes mellitus with other skin ulcer L89.610 Pressure ulcer of right heel, unstageable Couvillon, Sianna [name] ([id number]) [id number] Page 12 of 12 L89.513 Pressure ulcer of right ankle, stage 3 L89.620 Pressure ulcer of left heel, unstageable M86.172 Other acute osteomyelitis, left ankle and foot L89.214 Pressure ulcer of right hip, stage 4 L89.220 Pressure ulcer of left hip, unstageable I50.32 Chronic diastolic (congestive) heart failure Z79.84 Long term (current) use of oral hypoglycemic drugs Facility Procedures : CPT4 Code: 63016010 Description: 93235 - DEBRIDE W/O ANES NON SELECT Modifier: Quantity: 1 Physician Procedures : CPT4 Code Description Modifier [id number] - WC PHYS LEVEL 3 - EST PT ICD-10 Diagnosis Description L97.822 Non-pressure chronic ulcer of other part of left lower leg with fat layer exposed L97.812 Non-pressure chronic ulcer of other part of right  lower leg with fat layer exposed L89.214 Pressure ulcer of right hip, stage 4 Quantity: 1 Electronic  Signature(s) Signed: 08/21/2023 4:50:17 PM By: Baltazar Najjar MD Entered By: Baltazar Najjar on 08/21/2023 08:09:37  Exam Details Patient Name: Date of Service: [facility], [name] 08/21/2023 10:00 A M Medical Record Number: [id number] Patient Account Number: [id number] [name], [name] ([id number]) [id number].pdf Page 4 of 12 Date of Birth/Sex: Treating RN: 1941-05-23 (82 y.o. [name] Primary Care Provider: Barbette Reichmann Other Clinician: Betha Loa Referring Provider: Treating Provider/Extender: [name], [name], Vishwanath Weeks in Treatment: 47 Constitutional Sitting or standing Blood Pressure is within target range for patient.. Pulse regular and within target range for patient.Marland Kitchen Respirations regular, non-labored and within target range.. Temperature is normal and within the target range for the patient.. Notes Wound exam; the patient has multiple wounds including areas on both greater trochanters. The area on the right still has exposed bone but generally healthy looking granulation otherwise on the left nonviable surface but improved from last week when I did an extensive mechanical debridement we have been using Santyl and that we will continue. Most of the wounds on her bilateral lower  legs look better using Vashe wet-to-dry Electronic Signature(s) Signed: 08/21/2023 4:50:17 PM By: Baltazar Najjar MD Entered By: Baltazar Najjar on 08/21/2023 08:06:49 -------------------------------------------------------------------------------- Physician Orders Details Patient Name: Date of Service: [facility], [name] 08/21/2023 10:00 A M Medical Record Number: [id number] Patient Account Number: [id number] Date of Birth/Sex: Treating RN: 1940-12-01 (82 y.o. [name] Primary Care Provider: Barbette Reichmann Other Clinician: Betha Loa Referring Provider: Treating Provider/Extender: [name], [name], Vishwanath Weeks in Treatment: 75 Verbal / Phone Orders: Yes Clinician: Yevonne Pax Read Back and Verified: Yes Diagnosis Coding Follow-up Appointments Return Appointment in 2 weeks. Home Health Home Health Company: - Harlin Rain, RN [phone] [facility] for wound care. May utilize formulary equivalent dressing for wound treatment orders unless otherwise specified. Home Health Nurse may visit PRN to address patients wound care needs. - left trochanter, santyl daily all other wounds- vashe damp tp dry daily   Off-LoadingLow air-loss mattress (Group 2) Wound Treatment Wound #11 - Trochanter Wound Laterality: Right Cleanser: Vashe 5.8 (oz) (Home Health) 1 x Per Day/30 Days Discharge Instructions: damp to dry daily Secondary Dressing: (BORDER) Zetuvit Plus SILICONE BORDER Dressing 5x5 (in/in) 1 x Per Day/30 Days Discharge Instructions: Please do not put silicone bordered dressings under wraps. Use non-bordered dressing only. Wound #13 - Calcaneus Wound Laterality: Left Cleanser: Vashe 5.8 (oz) (Home Health) 1 x Per Day/30 Days Discharge Instructions: damp to dry daily Secondary Dressing: ABD Pad 5x9 (in/in) 1 x Per Day/30 Days Discharge Instructions: Cover with ABD pad Secondary Dressing: Kerlix 4.5 x 4.1 (in/yd) 1 x Per Day/30  Days Discharge Instructions: Apply Kerlix 4.5 x 4.1 (in/yd) as instructed [name], [name] ([id number]) [id number] Page 5 of 12 Secured With: ACE WRAP - 21M ACE Elastic Bandage With VELCRO Brand Closure, 4 (in) 1 x Per Day/30 Days Wound #14 - Lower Leg Wound Laterality: Right, Medial, Distal Cleanser: Vashe 5.8 (oz) (Home Health) 1 x Per Day/30 Days Discharge Instructions: damp to dry daily Secondary Dressing: ABD Pad 5x9 (in/in) 1 x Per Day/30 Days Discharge Instructions: Cover with ABD pad Secondary Dressing: Kerlix 4.5 x 4.1 (in/yd) 1 x Per Day/30 Days Discharge Instructions: Apply Kerlix 4.5 x 4.1 (in/yd) as instructed Secured With: ACE WRAP - 21M ACE Elastic Bandage With VELCRO Brand Closure, 4 (in) 1 x Per Day/30 Days Wound #15 - Lower Leg Wound Laterality: Left, Anterior Cleanser: Vashe 5.8 (oz) (Home Health) 1 x Per Day/30 Days Discharge Instructions: damp to dry daily  Exam Details Patient Name: Date of Service: [facility], [name] 08/21/2023 10:00 A M Medical Record Number: [id number] Patient Account Number: [id number] [name], [name] ([id number]) [id number].pdf Page 4 of 12 Date of Birth/Sex: Treating RN: 1941-05-23 (82 y.o. [name] Primary Care Provider: Barbette Reichmann Other Clinician: Betha Loa Referring Provider: Treating Provider/Extender: [name], [name], Vishwanath Weeks in Treatment: 47 Constitutional Sitting or standing Blood Pressure is within target range for patient.. Pulse regular and within target range for patient.Marland Kitchen Respirations regular, non-labored and within target range.. Temperature is normal and within the target range for the patient.. Notes Wound exam; the patient has multiple wounds including areas on both greater trochanters. The area on the right still has exposed bone but generally healthy looking granulation otherwise on the left nonviable surface but improved from last week when I did an extensive mechanical debridement we have been using Santyl and that we will continue. Most of the wounds on her bilateral lower  legs look better using Vashe wet-to-dry Electronic Signature(s) Signed: 08/21/2023 4:50:17 PM By: Baltazar Najjar MD Entered By: Baltazar Najjar on 08/21/2023 08:06:49 -------------------------------------------------------------------------------- Physician Orders Details Patient Name: Date of Service: [facility], [name] 08/21/2023 10:00 A M Medical Record Number: [id number] Patient Account Number: [id number] Date of Birth/Sex: Treating RN: 1940-12-01 (82 y.o. [name] Primary Care Provider: Barbette Reichmann Other Clinician: Betha Loa Referring Provider: Treating Provider/Extender: [name], [name], Vishwanath Weeks in Treatment: 75 Verbal / Phone Orders: Yes Clinician: Yevonne Pax Read Back and Verified: Yes Diagnosis Coding Follow-up Appointments Return Appointment in 2 weeks. Home Health Home Health Company: - Harlin Rain, RN [phone] [facility] for wound care. May utilize formulary equivalent dressing for wound treatment orders unless otherwise specified. Home Health Nurse may visit PRN to address patients wound care needs. - left trochanter, santyl daily all other wounds- vashe damp tp dry daily   Off-LoadingLow air-loss mattress (Group 2) Wound Treatment Wound #11 - Trochanter Wound Laterality: Right Cleanser: Vashe 5.8 (oz) (Home Health) 1 x Per Day/30 Days Discharge Instructions: damp to dry daily Secondary Dressing: (BORDER) Zetuvit Plus SILICONE BORDER Dressing 5x5 (in/in) 1 x Per Day/30 Days Discharge Instructions: Please do not put silicone bordered dressings under wraps. Use non-bordered dressing only. Wound #13 - Calcaneus Wound Laterality: Left Cleanser: Vashe 5.8 (oz) (Home Health) 1 x Per Day/30 Days Discharge Instructions: damp to dry daily Secondary Dressing: ABD Pad 5x9 (in/in) 1 x Per Day/30 Days Discharge Instructions: Cover with ABD pad Secondary Dressing: Kerlix 4.5 x 4.1 (in/yd) 1 x Per Day/30  Days Discharge Instructions: Apply Kerlix 4.5 x 4.1 (in/yd) as instructed [name], [name] ([id number]) [id number] Page 5 of 12 Secured With: ACE WRAP - 21M ACE Elastic Bandage With VELCRO Brand Closure, 4 (in) 1 x Per Day/30 Days Wound #14 - Lower Leg Wound Laterality: Right, Medial, Distal Cleanser: Vashe 5.8 (oz) (Home Health) 1 x Per Day/30 Days Discharge Instructions: damp to dry daily Secondary Dressing: ABD Pad 5x9 (in/in) 1 x Per Day/30 Days Discharge Instructions: Cover with ABD pad Secondary Dressing: Kerlix 4.5 x 4.1 (in/yd) 1 x Per Day/30 Days Discharge Instructions: Apply Kerlix 4.5 x 4.1 (in/yd) as instructed Secured With: ACE WRAP - 21M ACE Elastic Bandage With VELCRO Brand Closure, 4 (in) 1 x Per Day/30 Days Wound #15 - Lower Leg Wound Laterality: Left, Anterior Cleanser: Vashe 5.8 (oz) (Home Health) 1 x Per Day/30 Days Discharge Instructions: damp to dry daily  classified 09/26/2022 No Yes E11.622 Type 2 diabetes mellitus with other skin ulcer 09/26/2022 No Yes L89.610 Pressure ulcer of right heel, unstageable 09/26/2022 No Yes L89.513 Pressure ulcer of right ankle, stage 3 12/05/2022 No Yes L89.620 Pressure ulcer of left heel, unstageable 12/05/2022 No Yes M86.172 Other acute osteomyelitis, left ankle and foot 10/24/2022 No Yes L89.214 Pressure ulcer of right hip, stage 4 11/21/2022 No Yes L89.220 Pressure ulcer of left hip, unstageable 05/22/2023 No Yes I50.32 Chronic diastolic (congestive) heart failure 09/26/2022 No Yes [name], [name] ([id number]) [id number] Page 7 of 12 Z79.84 Long term (current) use of oral hypoglycemic drugs 03/13/2023 No Yes Inactive Problems ICD-10 Code Description Active Date Inactive Date S81.802A Unspecified open wound, left lower leg, initial encounter 05/22/2023 05/22/2023 Resolved Problems ICD-10 Code Description Active  Date Resolved Date L89.320 Pressure ulcer of left buttock, unstageable 09/26/2022 09/26/2022 S91.302A Unspecified open wound, left foot, initial encounter 10/24/2022 10/24/2022 Electronic Signature(s) Signed: 08/21/2023 4:50:17 PM By: Baltazar Najjar MD Entered By: Baltazar Najjar on 08/21/2023 08:02:17 -------------------------------------------------------------------------------- Progress Note Details Patient Name: Date of Service: [facility], [name] 08/21/2023 10:00 A M Medical Record Number: [id number] Patient Account Number: [id number] Date of Birth/Sex: Treating RN: [birth year] (82 y.o. [name] Primary Care Provider: Barbette Reichmann Other Clinician: Betha Loa Referring Provider: Treating Provider/Extender: [name], [name], Vishwanath Weeks in Treatment: 47 Subjective History of Present Illness (HPI) 82 year old patient who comes with a referral for bilateral lower extremity edema and a lower extremity ulceration and has been sent by her PCP [name]. I understand the patient was recently put on amoxicillin and doxycycline but could not tolerate the amoxicillin. doxycycline course was completed. a BNP and EKG was supposed to be normal and the patient did not have any dyspnea. the patient has been on a diuretic. The patient was also prescribed a pair of elastic compression stockings of the 20-30 mmHg pressure variety. x-ray of the right ankle was done on 09/20/2015 and showed posttraumatic and postsurgical changes of the right ankle with secondary degenerative changes of the tibiotalar joint and to a lesser degree the subtalar joint. No definite acute bony abnormalities are noted. Past medical history significant for diabetes mellitus, hypertension, hyperlipidemia, right breast cancer treated with a mastectomy in 2014. She has never smoked. 10/24/2015 -- she had delayed her vascular test because of her husband surgery but she is now ready to get him  taken care of. He is also unable to use compression stockings and hence we will need to order her Juzo wraps. 10/31/2015-- was seen by Dr. Wyn Quaker on 10/28/2015. She had a left lower extremity arterial duplex done at his office a couple of years ago and that was essentially normal. Today they performed a venous duplex which revealed no evidence of deep vein thrombosis, superficial thrombophlebitis, no venous reflex was seen on the right and a minimal amount of reflux was seen on the left great saphenous vein but no significant reflux was seen. Impression was that there was a component of lymphedema present from a previous surgery and he would recommend compression stockings and leg elevation. 10/2; is a patient we are following every 2 weeks. Apparently seen by Dr. Joylene Draft since she was last here no a day additional antibiotics were added. There was some suggestion about US obtaining cultures and we could bring them to her attention. The patient has stage IV wound over the right greater trochanter and unstageable area on the left but it is probably

## 2023-09-09 NOTE — Progress Notes (Signed)
improved again using Vashe wet-to-dry. We have been attempting to get a level 3 surface or some form of offloading surface for the patient although having trouble with their insurance which apparently is [name] 10/16; patient is doing very well. The wounds on the lower extremity even the extensive areas seem to be a lot better with healthy granulation. We have been using the [facility] equivalent of Vashe wet-to-dry. There is areas over the lateral part of the greater trochanters. The area on the left looks a lot better we have been using Santyl with backing wet to dry gauze. We have been using Vashe wet-to-dry in the right greater trochanter. I think we are in line with changing the dressing next week to both hip areas [name], [name] ([id number]) [phone].pdf Page 4 of 13 We are apparently finally making some inroads in getting her a level 3 surface for her bed 10/23; patient continues to make nice improvement in the bilateral lower leg wounds using the equivalent of Vashe wet-to-dry. I did plan to change this dressing but the wounds are making such a nice improvement I continued it. T oday I have changed the greater trochanter stage IV  pressure ulcers to a Prisma and then hydrogel wet-to-dry packing with border foam change every 2 D. Really remarkable improvement credit to the family this looking after her Electronic Signature(s) Signed: 09/05/2023 3:33:32 PM By: [name] Najjar MD Entered By: [name] on 09/04/2023 14:01:18 -------------------------------------------------------------------------------- Physical Exam Details Patient Name: Date of Service: [facility], [name]. 09/04/2023 3:30 PM Medical Record Number: [id number] Patient Account Number: [id number] Date of Birth/Sex: Treating RN: 12-Apr-1941 (82 y.o. [name] Primary Care Provider: Barbette [name] Other Clinician: Betha [name] Referring Provider: Treating Provider/Extender: [name] Weeks in Treatment: 49 Constitutional Sitting or standing Blood Pressure is within target range for patient.. Pulse regular and within target range for patient.Marland Kitchen Respirations regular, non-labored and within target range.. Temperature is normal and within the target range for the patient.Marland Kitchen appears in no distress. Notes Wound exam Right greater trochanter still has exposed bone and undermining at 12-2 however this seems to have come down. Healthy granulation. No evidence of surrounding infection Left greater trochanter has a smaller wound circumference. I used a #3 curette to remove subcutaneous tissue and tendon to get to viable granulation. We are going to change to silver collagen and backing hydrogel wet-to-dry The wounds on her bilateral heels are improved also the area on the medial left calcaneus. All the wounds on the lower extremity are improved Electronic Signature(s) Signed: 09/05/2023 3:33:32 PM By: [name] Najjar MD Entered By: [name] on 09/04/2023 14:02:33 -------------------------------------------------------------------------------- Physician Orders Details Patient Name: Date of Service: [name],  [name]. 09/04/2023 3:30 PM Medical Record Number: [id number] Patient Account Number: [id number] Date of Birth/Sex: Treating RN: Jul 22, 1941 (82 y.o. [name] Primary Care Provider: Barbette [name] Other Clinician: Betha [name] Referring Provider: Treating Provider/Extender: [name] Weeks in Treatment: 9 The following information was scribed by: [name] The information was scribed for: [name] Verbal / Phone Orders: Yes Clinician: Yevonne Pax Read Back and VerifiedHASLEY, [name] ([id number]) [phone].pdf Page 5 of 13 Diagnosis Coding Follow-up Appointments Return Appointment in 2 weeks. Home Health Home Health Company: - Harlin Rain, RN [phone] [facility] for wound care. May utilize formulary equivalent dressing for wound treatment orders unless otherwise specified. Home Health Nurse may visit PRN to  non-bordered dressing only. Wound #6 - Calcaneus Wound Laterality: Right Cleanser: Vashe 5.8 (oz) (Home Health) 1 x Per Day/30 Days Discharge Instructions: damp to dry daily over Santyl Secondary Dressing: ABD Pad 5x9 (in/in) 1 x Per Day/30 Days Discharge Instructions: Cover with ABD pad Secondary Dressing: Kerlix 4.5 x 4.1 (in/yd) 1 x Per Day/30 Days Discharge Instructions: Apply Kerlix 4.5 x 4.1 (in/yd) as instructed Secured With: ACE WRAP - 81M ACE Elastic Bandage With VELCRO Brand Closure, 4 (in) 1 x Per Day/30 Days Devices Bed, Specialty - Group 3 bed and mattress Electronic Signature(s) Unsigned Previous Signature: 09/08/2023 11:21:49 AM Version By: [name] Najjar MD Previous Signature: 09/09/2023 4:31:16 PM Version By: [name] Previous Signature: 09/06/2023 10:28:30 AM Version By: [name] Previous Signature: 09/04/2023 5:46:43 PM Version By: [name] Previous Signature: 09/05/2023 3:33:32 PM Version By: [name] Najjar MD Entered By: [name] on 09/09/2023 13:41:47 Prescription  09/04/2023 -------------------------------------------------------------------------------- [name] Heimlich MD Patient Name: Provider: [birth year] [id number] Date of Birth: NPI#Jerral Ralph Sex: DEA #: [phone] Phone #: License #: [id number] UPN: Patient Address: [address] [facility] [place], [place] [address] [facility] [address] [place], [place] [address] [phone] Allergies Sulfa (Sulfonamide Antibiotics) Provider's Orders [name], [name] ([id number]) [id number] Page 7 of 13 Bed, Specialty - Group 3 bed and mattress Hand Signature: Date(s): Electronic Signature(s) Unsigned Previous Signature: 09/08/2023 11:21:49 AM Version By: [name] Najjar MD Previous Signature: 09/09/2023 4:31:16 PM Version By: [name] Entered By: [name] on 09/09/2023 13:41:48 -------------------------------------------------------------------------------- Problem List Details Patient Name: Date of Service: [facility], [name]. 09/04/2023 3:30 PM Medical Record Number: [id number] Patient Account Number: [id number] Date of Birth/Sex: Treating RN: 18-Mar-1941 (82 y.o. 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 09/26/2022 No Yes I89.0 Lymphedema, not elsewhere classified 09/26/2022 No Yes E11.622 Type 2 diabetes  mellitus with other skin ulcer 09/26/2022 No Yes L89.610 Pressure ulcer of right heel, unstageable 09/26/2022 No Yes L89.513 Pressure ulcer of right ankle, stage 3 12/05/2022 No Yes L89.620 Pressure ulcer of left heel, unstageable 12/05/2022 No Yes M86.172 Other acute osteomyelitis, left ankle and foot 10/24/2022 No Yes Markuson, Shalin [name] ([id number]) [id number] Page 8 of 13 L89.214 Pressure ulcer of right hip, stage 4 11/21/2022 No Yes L89.220 Pressure ulcer of left hip, unstageable 05/22/2023 No Yes I50.32 Chronic diastolic (congestive) heart failure 09/26/2022 No Yes Z79.84 Long term (current) use of oral hypoglycemic drugs 03/13/2023 No Yes Inactive Problems ICD-10 Code Description Active Date Inactive Date S81.802A Unspecified open wound, left lower leg, initial encounter 05/22/2023 05/22/2023 Resolved Problems ICD-10 Code Description Active Date Resolved Date L89.320 Pressure ulcer of left buttock, unstageable 09/26/2022 09/26/2022 S91.302A Unspecified open wound, left foot, initial encounter 10/24/2022 10/24/2022 Electronic Signature(s) Signed: 09/05/2023 3:33:32 PM By: [name] Najjar MD Entered By: [name] on 09/04/2023 14:00:12 -------------------------------------------------------------------------------- Progress Note Details Patient Name: Date of Service: [facility], [name]. 09/04/2023 3:30 PM Medical Record Number: [id number] Patient Account Number: [id number] Date of Birth/Sex:  Treating RN: 06-01-1941 (82 y.o. [name] Primary Care Provider: Barbette [name] Other Clinician: Betha [name] Referring Provider: Treating Provider/Extender: [name] Weeks in Treatment: 49 Subjective History of Present Illness (HPI) 82 year old patient who comes with a referral for bilateral lower extremity edema and a lower extremity ulceration and has been sent by her PCP [name]. I understand the patient was  recently put on amoxicillin and doxycycline but could not tolerate the amoxicillin. doxycycline course was completed. a BNP and EKG was supposed to be normal and the patient did not have any dyspnea. the patient has been on a diuretic. The patient was also prescribed a pair of elastic compression stockings of the 20-30 mmHg pressure variety. x-ray of the right ankle was done on 09/20/2015 and showed posttraumatic and postsurgical changes of the right ankle with secondary degenerative changes of the tibiotalar joint and to a lesser degree the subtalar joint. No definite acute bony abnormalities are noted. LOYDENE, SCHROCK ([id number]) [id number] Page 9 of 13 Past medical history significant for diabetes mellitus, hypertension, hyperlipidemia, right breast cancer treated with a mastectomy in 2014. She has never smoked. 10/24/2015 -- she had delayed her vascular test because of her husband surgery but she is now ready to get him taken care of. He is also unable to use compression stockings and hence we will need to order her Juzo wraps. 10/31/2015-- was seen by Dr. Wyn Quaker on 10/28/2015. She had a left lower extremity arterial duplex done at his office a couple of years ago and that was essentially normal. Today they performed a venous duplex which revealed no evidence of deep vein thrombosis, superficial thrombophlebitis, no venous reflex was seen on the right and a minimal amount of reflux was seen on the left great saphenous vein but no significant reflux was seen. Impression was that there was a component of lymphedema present from a previous surgery and he would recommend compression stockings and leg elevation. 10/2; is a patient we are following every 2 weeks. Apparently seen by Dr. Joylene Draft since she was last here no a day additional antibiotics were added. There was some suggestion about US obtaining cultures and we could bring them to her attention. The patient has  stage IV wound over the right greater trochanter and unstageable area on the left but it is probably stage IV as well. She has a small area on the tip of her left heel larger wounds on the left [name] left medial foot, left medial ankle Readmission: 07-24-2022 upon evaluation today patient presents for initial inspection here in our clinic concerning issues that she has been having with her legs this is actually been going on for several years according to what her family member with her today tells me as well as what the patient reiterates as well. She is currently most recently been seeing Dr. Ether Griffins and subsequently he had her in Bentley boots. However 2 weeks ago he referred her to [place] and then subsequently took her out of the Unna boot wraps at that point. At this time the left leg looks to be worse in the right leg currently. She is on Lasix and lisinopril with hydrochlorothiazide she has high blood pressure she also has issues currently with lower extremity swelling and edema which has been an ongoing issue for her as well. Patient does have a history of chronic venous hypertension, lymphedema, diabetes mellitus type 2, hypertension, peripheral vascular disease, and neuropathy. Currently she is on  ERNELL, SKALSKI ([id number]) [id number] Page 1 of 13 Visit Report for 09/04/2023 Debridement Details Patient Name: Date of Service: [name] [place] [name]. 09/04/2023 3:30 PM Medical Record Number: [id number] Patient Account Number: [id number] Date of Birth/Sex: Treating RN: Dec 05, 1940 (82 y.o. [name] Primary Care Provider: Barbette [name] Other Clinician: Betha [name] Referring Provider: Treating Provider/Extender: [name] Weeks in Treatment: 49 Debridement Performed for Assessment: Wound #16 Left Trochanter Performed By: Physician [name] Caul, MD The following information was scribed by: [name] The information was scribed for: Lauranne Beyersdorf G Debridement Type: Debridement Level of Consciousness (Pre-procedure): Awake and Alert Pre-procedure Verification/Time Out Yes - 16:52 Taken: Start Time: 16:52 Percent of Wound Bed Debrided: 100% T Area Debrided (cm): otal 1.7 Tissue and other material debrided: Viable, Non-Viable, Subcutaneous, Tendon Level: Skin/Subcutaneous Tissue/Muscle Debridement Description: Excisional Instrument: Curette Bleeding: Minimum Hemostasis Achieved: Pressure Response to Treatment: Procedure was tolerated well Level of Consciousness (Post- Awake and Alert procedure): Post Debridement Measurements of Total Wound Length: (cm) 1.8 Stage: Unstageable/Unclassified Width: (cm) 1.2 Depth: (cm) 1 Volume: (cm) 1.696 Character of Wound/Ulcer Post Debridement: Stable Post Procedure Diagnosis Same as Pre-procedure Electronic Signature(s) Signed: 09/06/2023 10:28:30 AM By: [name] Signed: 09/08/2023 11:21:49 AM By: [name] Najjar MD Signed: 09/09/2023 8:00:25 AM By: Yevonne Pax RN Previous Signature: 09/04/2023 5:46:43 PM Version By: [name] Previous Signature: 09/05/2023 3:33:32 PM Version By: [name] Najjar MD Entered By: [name] on 09/06/2023  07:27:33 Nolting, [name] ([id number]) [id number] Page 2 of 13 -------------------------------------------------------------------------------- HPI Details Patient Name: Date of Service: [facility], [name]. 09/04/2023 3:30 PM Medical Record Number: [id number] Patient Account Number: [id number] Date of Birth/Sex: Treating RN: 1941-03-25 (82 y.o. [name] Primary Care Provider: Barbette [name] Other Clinician: Betha [name] Referring Provider: Treating Provider/Extender: [name] Weeks in Treatment: 26 History of Present Illness HPI Description: 82 year old patient who comes with a referral for bilateral lower extremity edema and a lower extremity ulceration and has been sent by her PCP [name]. I understand the patient was recently put on amoxicillin and doxycycline but could not tolerate the amoxicillin. doxycycline course was completed. a BNP and EKG was supposed to be normal and the patient did not have any dyspnea. the patient has been on a diuretic. The patient was also prescribed a pair of elastic compression stockings of the 20-30 mmHg pressure variety. x-ray of the right ankle was done on 09/20/2015 and showed posttraumatic and postsurgical changes of the right ankle with secondary degenerative changes of the tibiotalar joint and to a lesser degree the subtalar joint. No definite acute bony abnormalities are noted. Past medical history significant for diabetes mellitus, hypertension, hyperlipidemia, right breast cancer treated with a mastectomy in 2014. She has never smoked. 10/24/2015 -- she had delayed her vascular test because of her husband surgery but she is now ready to get him taken care of. He is also unable to use compression stockings and hence we will need to order her Juzo wraps. 10/31/2015-- was seen by Dr. Wyn Quaker on 10/28/2015. She had a left lower extremity arterial duplex done at his office a  couple of years ago and that was essentially normal. Today they performed a venous duplex which revealed no evidence of deep vein thrombosis, superficial thrombophlebitis, no venous reflex was seen on the right and a minimal amount of reflux was seen on the left great saphenous vein but no significant reflux was  ERNELL, SKALSKI ([id number]) [id number] Page 1 of 13 Visit Report for 09/04/2023 Debridement Details Patient Name: Date of Service: [name] [place] [name]. 09/04/2023 3:30 PM Medical Record Number: [id number] Patient Account Number: [id number] Date of Birth/Sex: Treating RN: Dec 05, 1940 (82 y.o. [name] Primary Care Provider: Barbette [name] Other Clinician: Betha [name] Referring Provider: Treating Provider/Extender: [name] Weeks in Treatment: 49 Debridement Performed for Assessment: Wound #16 Left Trochanter Performed By: Physician [name] Caul, MD The following information was scribed by: [name] The information was scribed for: Lauranne Beyersdorf G Debridement Type: Debridement Level of Consciousness (Pre-procedure): Awake and Alert Pre-procedure Verification/Time Out Yes - 16:52 Taken: Start Time: 16:52 Percent of Wound Bed Debrided: 100% T Area Debrided (cm): otal 1.7 Tissue and other material debrided: Viable, Non-Viable, Subcutaneous, Tendon Level: Skin/Subcutaneous Tissue/Muscle Debridement Description: Excisional Instrument: Curette Bleeding: Minimum Hemostasis Achieved: Pressure Response to Treatment: Procedure was tolerated well Level of Consciousness (Post- Awake and Alert procedure): Post Debridement Measurements of Total Wound Length: (cm) 1.8 Stage: Unstageable/Unclassified Width: (cm) 1.2 Depth: (cm) 1 Volume: (cm) 1.696 Character of Wound/Ulcer Post Debridement: Stable Post Procedure Diagnosis Same as Pre-procedure Electronic Signature(s) Signed: 09/06/2023 10:28:30 AM By: [name] Signed: 09/08/2023 11:21:49 AM By: [name] Najjar MD Signed: 09/09/2023 8:00:25 AM By: Yevonne Pax RN Previous Signature: 09/04/2023 5:46:43 PM Version By: [name] Previous Signature: 09/05/2023 3:33:32 PM Version By: [name] Najjar MD Entered By: [name] on 09/06/2023  07:27:33 Nolting, [name] ([id number]) [id number] Page 2 of 13 -------------------------------------------------------------------------------- HPI Details Patient Name: Date of Service: [facility], [name]. 09/04/2023 3:30 PM Medical Record Number: [id number] Patient Account Number: [id number] Date of Birth/Sex: Treating RN: 1941-03-25 (82 y.o. [name] Primary Care Provider: Barbette [name] Other Clinician: Betha [name] Referring Provider: Treating Provider/Extender: [name] Weeks in Treatment: 26 History of Present Illness HPI Description: 82 year old patient who comes with a referral for bilateral lower extremity edema and a lower extremity ulceration and has been sent by her PCP [name]. I understand the patient was recently put on amoxicillin and doxycycline but could not tolerate the amoxicillin. doxycycline course was completed. a BNP and EKG was supposed to be normal and the patient did not have any dyspnea. the patient has been on a diuretic. The patient was also prescribed a pair of elastic compression stockings of the 20-30 mmHg pressure variety. x-ray of the right ankle was done on 09/20/2015 and showed posttraumatic and postsurgical changes of the right ankle with secondary degenerative changes of the tibiotalar joint and to a lesser degree the subtalar joint. No definite acute bony abnormalities are noted. Past medical history significant for diabetes mellitus, hypertension, hyperlipidemia, right breast cancer treated with a mastectomy in 2014. She has never smoked. 10/24/2015 -- she had delayed her vascular test because of her husband surgery but she is now ready to get him taken care of. He is also unable to use compression stockings and hence we will need to order her Juzo wraps. 10/31/2015-- was seen by Dr. Wyn Quaker on 10/28/2015. She had a left lower extremity arterial duplex done at his office a  couple of years ago and that was essentially normal. Today they performed a venous duplex which revealed no evidence of deep vein thrombosis, superficial thrombophlebitis, no venous reflex was seen on the right and a minimal amount of reflux was seen on the left great saphenous vein but no significant reflux was  instructed Secured With: ACE WRAP - 68M ACE Elastic Bandage With VELCRO Brand Closure, 4 (in) 1 x Per Day/30 Days 1. I have changed the dressings on both greater trochanters to silver collagen backing hydrogel wet-to-dry border foam change every 2 2. Continue with the Vashe equivalent wet-to-dry on the bilateral lower extremity wounds all of which appear to be contracting 3. Follow-up in 2 weeks. 4. Consider a wound VAC especially on the right Electronic Signature(s) Signed: 09/05/2023 3:33:32 PM By: [name] Najjar MD Entered By: [name] on 09/04/2023 14:04:01 Rosiak, [name] ([id number]) [id number] Page 13 of 13 -------------------------------------------------------------------------------- SuperBill Details Patient Name: Date of Service: [facility], [name]. 09/04/2023 Medical Record Number: [id number] Patient Account Number: [id number] Date of Birth/Sex: Treating RN: 1941-05-26 (82 y.o. 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) with ulcer of left lower extremity I89.0 Lymphedema, not elsewhere classified E11.622 Type 2 diabetes mellitus with other skin ulcer L89.610 Pressure ulcer of right heel, unstageable L89.513 Pressure ulcer of right ankle, stage 3 L89.620 Pressure ulcer of left heel, unstageable M86.172 Other acute osteomyelitis, left ankle and foot L89.214 Pressure ulcer of right hip, stage 4 L89.220 Pressure ulcer of left hip, unstageable I50.32 Chronic diastolic (congestive) heart failure Z79.84 Long term (current) use of oral hypoglycemic drugs Facility Procedures : CPT4 Code: [id number] Description: 11043 - DEB MUSC/FASCIA 20 SQ CM/< ICD-10 Diagnosis Description L89.220 Pressure ulcer of left hip, unstageable Modifier: Quantity: 1 Physician Procedures : CPT4 Code Description Modifier [id number] 11043 - WC PHYS DEBR MUSCLE/FASCIA 20 SQ CM ICD-10 Diagnosis Description L89.220 Pressure ulcer of left hip, unstageable Quantity: 1 Electronic Signature(s) Signed: 09/05/2023 3:33:32 PM By: [name] Najjar MD Entered By: [name] on  09/04/2023 14:04:19  instructed Secured With: ACE WRAP - 68M ACE Elastic Bandage With VELCRO Brand Closure, 4 (in) 1 x Per Day/30 Days 1. I have changed the dressings on both greater trochanters to silver collagen backing hydrogel wet-to-dry border foam change every 2 2. Continue with the Vashe equivalent wet-to-dry on the bilateral lower extremity wounds all of which appear to be contracting 3. Follow-up in 2 weeks. 4. Consider a wound VAC especially on the right Electronic Signature(s) Signed: 09/05/2023 3:33:32 PM By: [name] Najjar MD Entered By: [name] on 09/04/2023 14:04:01 Rosiak, [name] ([id number]) [id number] Page 13 of 13 -------------------------------------------------------------------------------- SuperBill Details Patient Name: Date of Service: [facility], [name]. 09/04/2023 Medical Record Number: [id number] Patient Account Number: [id number] Date of Birth/Sex: Treating RN: 1941-05-26 (82 y.o. 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) with ulcer of left lower extremity I89.0 Lymphedema, not elsewhere classified E11.622 Type 2 diabetes mellitus with other skin ulcer L89.610 Pressure ulcer of right heel, unstageable L89.513 Pressure ulcer of right ankle, stage 3 L89.620 Pressure ulcer of left heel, unstageable M86.172 Other acute osteomyelitis, left ankle and foot L89.214 Pressure ulcer of right hip, stage 4 L89.220 Pressure ulcer of left hip, unstageable I50.32 Chronic diastolic (congestive) heart failure Z79.84 Long term (current) use of oral hypoglycemic drugs Facility Procedures : CPT4 Code: 21308657 Description: 11043 - DEB MUSC/FASCIA 20 SQ CM/< ICD-10 Diagnosis Description L89.220 Pressure ulcer of left hip, unstageable Modifier: Quantity: 1 Physician Procedures : CPT4 Code Description Modifier [id number] 11043 - WC PHYS DEBR MUSCLE/FASCIA 20 SQ CM ICD-10 Diagnosis Description L89.220 Pressure ulcer of left hip, unstageable Quantity: 1 Electronic Signature(s) Signed: 09/05/2023 3:33:32 PM By: [name] Najjar MD Entered By: [name] on  09/04/2023 14:04:19  non-bordered dressing only. Wound #6 - Calcaneus Wound Laterality: Right Cleanser: Vashe 5.8 (oz) (Home Health) 1 x Per Day/30 Days Discharge Instructions: damp to dry daily over Santyl Secondary Dressing: ABD Pad 5x9 (in/in) 1 x Per Day/30 Days Discharge Instructions: Cover with ABD pad Secondary Dressing: Kerlix 4.5 x 4.1 (in/yd) 1 x Per Day/30 Days Discharge Instructions: Apply Kerlix 4.5 x 4.1 (in/yd) as instructed Secured With: ACE WRAP - 81M ACE Elastic Bandage With VELCRO Brand Closure, 4 (in) 1 x Per Day/30 Days Devices Bed, Specialty - Group 3 bed and mattress Electronic Signature(s) Unsigned Previous Signature: 09/08/2023 11:21:49 AM Version By: [name] Najjar MD Previous Signature: 09/09/2023 4:31:16 PM Version By: [name] Previous Signature: 09/06/2023 10:28:30 AM Version By: [name] Previous Signature: 09/04/2023 5:46:43 PM Version By: [name] Previous Signature: 09/05/2023 3:33:32 PM Version By: [name] Najjar MD Entered By: [name] on 09/09/2023 13:41:47 Prescription  09/04/2023 -------------------------------------------------------------------------------- [name] Heimlich MD Patient Name: Provider: [birth year] [id number] Date of Birth: NPI#Jerral Ralph Sex: DEA #: [phone] Phone #: License #: [id number] UPN: Patient Address: [address] [facility] [place], [place] [address] [facility] [address] [place], [place] [address] [phone] Allergies Sulfa (Sulfonamide Antibiotics) Provider's Orders [name], [name] ([id number]) [id number] Page 7 of 13 Bed, Specialty - Group 3 bed and mattress Hand Signature: Date(s): Electronic Signature(s) Unsigned Previous Signature: 09/08/2023 11:21:49 AM Version By: [name] Najjar MD Previous Signature: 09/09/2023 4:31:16 PM Version By: [name] Entered By: [name] on 09/09/2023 13:41:48 -------------------------------------------------------------------------------- Problem List Details Patient Name: Date of Service: [facility], [name]. 09/04/2023 3:30 PM Medical Record Number: [id number] Patient Account Number: [id number] Date of Birth/Sex: Treating RN: 18-Mar-1941 (82 y.o. 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 09/26/2022 No Yes I89.0 Lymphedema, not elsewhere classified 09/26/2022 No Yes E11.622 Type 2 diabetes  mellitus with other skin ulcer 09/26/2022 No Yes L89.610 Pressure ulcer of right heel, unstageable 09/26/2022 No Yes L89.513 Pressure ulcer of right ankle, stage 3 12/05/2022 No Yes L89.620 Pressure ulcer of left heel, unstageable 12/05/2022 No Yes M86.172 Other acute osteomyelitis, left ankle and foot 10/24/2022 No Yes Markuson, Shalin [name] ([id number]) [id number] Page 8 of 13 L89.214 Pressure ulcer of right hip, stage 4 11/21/2022 No Yes L89.220 Pressure ulcer of left hip, unstageable 05/22/2023 No Yes I50.32 Chronic diastolic (congestive) heart failure 09/26/2022 No Yes Z79.84 Long term (current) use of oral hypoglycemic drugs 03/13/2023 No Yes Inactive Problems ICD-10 Code Description Active Date Inactive Date S81.802A Unspecified open wound, left lower leg, initial encounter 05/22/2023 05/22/2023 Resolved Problems ICD-10 Code Description Active Date Resolved Date L89.320 Pressure ulcer of left buttock, unstageable 09/26/2022 09/26/2022 S91.302A Unspecified open wound, left foot, initial encounter 10/24/2022 10/24/2022 Electronic Signature(s) Signed: 09/05/2023 3:33:32 PM By: [name] Najjar MD Entered By: [name] on 09/04/2023 14:00:12 -------------------------------------------------------------------------------- Progress Note Details Patient Name: Date of Service: [facility], [name]. 09/04/2023 3:30 PM Medical Record Number: [id number] Patient Account Number: [id number] Date of Birth/Sex:  improved again using Vashe wet-to-dry. We have been attempting to get a level 3 surface or some form of offloading surface for the patient although having trouble with their insurance which apparently is [name] 10/16; patient is doing very well. The wounds on the lower extremity even the extensive areas seem to be a lot better with healthy granulation. We have been using the [employer] equivalent of Vashe wet-to-dry. There is areas over the lateral part of the greater trochanters. The area on the left looks a lot better we have been using Santyl with backing wet to dry gauze. We have been using Vashe wet-to-dry in the right greater trochanter. I think we are in line with changing the dressing next week to both hip areas [name], [name] ([id number]) [phone].pdf Page 4 of 13 We are apparently finally making some inroads in getting her a level 3 surface for her bed 10/23; patient continues to make nice improvement in the bilateral lower leg wounds using the equivalent of Vashe wet-to-dry. I did plan to change this dressing but the wounds are making such a nice improvement I continued it. T oday I have changed the greater trochanter stage IV  pressure ulcers to a Prisma and then hydrogel wet-to-dry packing with border foam change every 2 D. Really remarkable improvement credit to the family this looking after her Electronic Signature(s) Signed: 09/05/2023 3:33:32 PM By: [name] Najjar MD Entered By: [name] on 09/04/2023 14:01:18 -------------------------------------------------------------------------------- Physical Exam Details Patient Name: Date of Service: [facility], [name]. 09/04/2023 3:30 PM Medical Record Number: [id number] Patient Account Number: [id number] Date of Birth/Sex: Treating RN: 12-Apr-1941 (82 y.o. [name] Primary Care Provider: Barbette [name] Other Clinician: Betha [name] Referring Provider: Treating Provider/Extender: [name] Weeks in Treatment: 49 Constitutional Sitting or standing Blood Pressure is within target range for patient.. Pulse regular and within target range for patient.Marland Kitchen Respirations regular, non-labored and within target range.. Temperature is normal and within the target range for the patient.Marland Kitchen appears in no distress. Notes Wound exam Right greater trochanter still has exposed bone and undermining at 12-2 however this seems to have come down. Healthy granulation. No evidence of surrounding infection Left greater trochanter has a smaller wound circumference. I used a #3 curette to remove subcutaneous tissue and tendon to get to viable granulation. We are going to change to silver collagen and backing hydrogel wet-to-dry The wounds on her bilateral heels are improved also the area on the medial left calcaneus. All the wounds on the lower extremity are improved Electronic Signature(s) Signed: 09/05/2023 3:33:32 PM By: [name] Najjar MD Entered By: [name] on 09/04/2023 14:02:33 -------------------------------------------------------------------------------- Physician Orders Details Patient Name: Date of Service: [name],  [name]. 09/04/2023 3:30 PM Medical Record Number: [id number] Patient Account Number: [id number] Date of Birth/Sex: Treating RN: Jul 22, 1941 (82 y.o. [name] Primary Care Provider: Barbette [name] Other Clinician: Betha [name] Referring Provider: Treating Provider/Extender: [name] Weeks in Treatment: 9 The following information was scribed by: [name] The information was scribed for: [name] Verbal / Phone Orders: Yes Clinician: Yevonne Pax Read Back and VerifiedHASLEY, [name] ([id number]) [phone].pdf Page 5 of 13 Diagnosis Coding Follow-up Appointments Return Appointment in 2 weeks. Home Health Home Health Company: - Harlin Rain, RN [phone] [facility] for wound care. May utilize formulary equivalent dressing for wound treatment orders unless otherwise specified. Home Health Nurse may visit PRN to  non-bordered dressing only. Wound #6 - Calcaneus Wound Laterality: Right Cleanser: Vashe 5.8 (oz) (Home Health) 1 x Per Day/30 Days Discharge Instructions: damp to dry daily over Santyl Secondary Dressing: ABD Pad 5x9 (in/in) 1 x Per Day/30 Days Discharge Instructions: Cover with ABD pad Secondary Dressing: Kerlix 4.5 x 4.1 (in/yd) 1 x Per Day/30 Days Discharge Instructions: Apply Kerlix 4.5 x 4.1 (in/yd) as instructed Secured With: ACE WRAP - 81M ACE Elastic Bandage With VELCRO Brand Closure, 4 (in) 1 x Per Day/30 Days Devices Bed, Specialty - Group 3 bed and mattress Electronic Signature(s) Unsigned Previous Signature: 09/08/2023 11:21:49 AM Version By: [name] Najjar MD Previous Signature: 09/09/2023 4:31:16 PM Version By: [name] Previous Signature: 09/06/2023 10:28:30 AM Version By: [name] Previous Signature: 09/04/2023 5:46:43 PM Version By: [name] Previous Signature: 09/05/2023 3:33:32 PM Version By: [name] Najjar MD Entered By: [name] on 09/09/2023 13:41:47 Prescription  09/04/2023 -------------------------------------------------------------------------------- [name] Heimlich MD Patient Name: Provider: [birth year] [id number] Date of Birth: NPI#Jerral Ralph Sex: DEA #: [phone] Phone #: License #: [id number] UPN: Patient Address: [address] [facility] [place], [place] [address] [facility] [address] [place], [place] [address] [phone] Allergies Sulfa (Sulfonamide Antibiotics) Provider's Orders [name], [name] ([id number]) [id number] Page 7 of 13 Bed, Specialty - Group 3 bed and mattress Hand Signature: Date(s): Electronic Signature(s) Unsigned Previous Signature: 09/08/2023 11:21:49 AM Version By: [name] Najjar MD Previous Signature: 09/09/2023 4:31:16 PM Version By: [name] Entered By: [name] on 09/09/2023 13:41:48 -------------------------------------------------------------------------------- Problem List Details Patient Name: Date of Service: [facility], [name]. 09/04/2023 3:30 PM Medical Record Number: [id number] Patient Account Number: [id number] Date of Birth/Sex: Treating RN: 18-Mar-1941 (82 y.o. 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 09/26/2022 No Yes I89.0 Lymphedema, not elsewhere classified 09/26/2022 No Yes E11.622 Type 2 diabetes  mellitus with other skin ulcer 09/26/2022 No Yes L89.610 Pressure ulcer of right heel, unstageable 09/26/2022 No Yes L89.513 Pressure ulcer of right ankle, stage 3 12/05/2022 No Yes L89.620 Pressure ulcer of left heel, unstageable 12/05/2022 No Yes M86.172 Other acute osteomyelitis, left ankle and foot 10/24/2022 No Yes Markuson, Shalin [name] ([id number]) [id number] Page 8 of 13 L89.214 Pressure ulcer of right hip, stage 4 11/21/2022 No Yes L89.220 Pressure ulcer of left hip, unstageable 05/22/2023 No Yes I50.32 Chronic diastolic (congestive) heart failure 09/26/2022 No Yes Z79.84 Long term (current) use of oral hypoglycemic drugs 03/13/2023 No Yes Inactive Problems ICD-10 Code Description Active Date Inactive Date S81.802A Unspecified open wound, left lower leg, initial encounter 05/22/2023 05/22/2023 Resolved Problems ICD-10 Code Description Active Date Resolved Date L89.320 Pressure ulcer of left buttock, unstageable 09/26/2022 09/26/2022 S91.302A Unspecified open wound, left foot, initial encounter 10/24/2022 10/24/2022 Electronic Signature(s) Signed: 09/05/2023 3:33:32 PM By: [name] Najjar MD Entered By: [name] on 09/04/2023 14:00:12 -------------------------------------------------------------------------------- Progress Note Details Patient Name: Date of Service: [facility], [name]. 09/04/2023 3:30 PM Medical Record Number: [id number] Patient Account Number: [id number] Date of Birth/Sex:  improved again using Vashe wet-to-dry. We have been attempting to get a level 3 surface or some form of offloading surface for the patient although having trouble with their insurance which apparently is [name] 10/16; patient is doing very well. The wounds on the lower extremity even the extensive areas seem to be a lot better with healthy granulation. We have been using the CVS equivalent of Vashe wet-to-dry. There is areas over the lateral part of the greater trochanters. The area on the left looks a lot better we have been using Santyl with backing wet to dry gauze. We have been using Vashe wet-to-dry in the right greater trochanter. I think we are in line with changing the dressing next week to both hip areas [name], [name] ([id number]) [phone].pdf Page 4 of 13 We are apparently finally making some inroads in getting her a level 3 surface for her bed 10/23; patient continues to make nice improvement in the bilateral lower leg wounds using the equivalent of Vashe wet-to-dry. I did plan to change this dressing but the wounds are making such a nice improvement I continued it. T oday I have changed the greater trochanter stage IV  pressure ulcers to a Prisma and then hydrogel wet-to-dry packing with border foam change every 2 D. Really remarkable improvement credit to the family this looking after her Electronic Signature(s) Signed: 09/05/2023 3:33:32 PM By: [name] Najjar MD Entered By: [name] on 09/04/2023 14:01:18 -------------------------------------------------------------------------------- Physical Exam Details Patient Name: Date of Service: [facility], [name]. 09/04/2023 3:30 PM Medical Record Number: [id number] Patient Account Number: [id number] Date of Birth/Sex: Treating RN: 12-Apr-1941 (82 y.o. [name] Primary Care Provider: Barbette [name] Other Clinician: Betha [name] Referring Provider: Treating Provider/Extender: [name] Weeks in Treatment: 49 Constitutional Sitting or standing Blood Pressure is within target range for patient.. Pulse regular and within target range for patient.Marland Kitchen Respirations regular, non-labored and within target range.. Temperature is normal and within the target range for the patient.Marland Kitchen appears in no distress. Notes Wound exam Right greater trochanter still has exposed bone and undermining at 12-2 however this seems to have come down. Healthy granulation. No evidence of surrounding infection Left greater trochanter has a smaller wound circumference. I used a #3 curette to remove subcutaneous tissue and tendon to get to viable granulation. We are going to change to silver collagen and backing hydrogel wet-to-dry The wounds on her bilateral heels are improved also the area on the medial left calcaneus. All the wounds on the lower extremity are improved Electronic Signature(s) Signed: 09/05/2023 3:33:32 PM By: [name] Najjar MD Entered By: [name] on 09/04/2023 14:02:33 -------------------------------------------------------------------------------- Physician Orders Details Patient Name: Date of Service: [name],  [name]. 09/04/2023 3:30 PM Medical Record Number: [id number] Patient Account Number: [id number] Date of Birth/Sex: Treating RN: Jul 22, 1941 (82 y.o. [name] Primary Care Provider: Barbette [name] Other Clinician: Betha [name] Referring Provider: Treating Provider/Extender: [name] Weeks in Treatment: 9 The following information was scribed by: [name] The information was scribed for: [name] Verbal / Phone Orders: Yes Clinician: Yevonne Pax Read Back and VerifiedHASLEY, [name] ([id number]) [phone].pdf Page 5 of 13 Diagnosis Coding Follow-up Appointments Return Appointment in 2 weeks. Home Health Home Health Company: - Harlin Rain, RN [phone] [facility] for wound care. May utilize formulary equivalent dressing for wound treatment orders unless otherwise specified. Home Health Nurse may visit PRN to  instructed Secured With: ACE WRAP - 68M ACE Elastic Bandage With VELCRO Brand Closure, 4 (in) 1 x Per Day/30 Days 1. I have changed the dressings on both greater trochanters to silver collagen backing hydrogel wet-to-dry border foam change every 2 2. Continue with the Vashe equivalent wet-to-dry on the bilateral lower extremity wounds all of which appear to be contracting 3. Follow-up in 2 weeks. 4. Consider a wound VAC especially on the right Electronic Signature(s) Signed: 09/05/2023 3:33:32 PM By: [name] Najjar MD Entered By: [name] on 09/04/2023 14:04:01 Rosiak, [name] ([id number]) [id number] Page 13 of 13 -------------------------------------------------------------------------------- SuperBill Details Patient Name: Date of Service: [facility], [name]. 09/04/2023 Medical Record Number: [id number] Patient Account Number: [id number] Date of Birth/Sex: Treating RN: 1941-05-26 (82 y.o. 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) with ulcer of left lower extremity I89.0 Lymphedema, not elsewhere classified E11.622 Type 2 diabetes mellitus with other skin ulcer L89.610 Pressure ulcer of right heel, unstageable L89.513 Pressure ulcer of right ankle, stage 3 L89.620 Pressure ulcer of left heel, unstageable M86.172 Other acute osteomyelitis, left ankle and foot L89.214 Pressure ulcer of right hip, stage 4 L89.220 Pressure ulcer of left hip, unstageable I50.32 Chronic diastolic (congestive) heart failure Z79.84 Long term (current) use of oral hypoglycemic drugs Facility Procedures : CPT4 Code: 21308657 Description: 11043 - DEB MUSC/FASCIA 20 SQ CM/< ICD-10 Diagnosis Description L89.220 Pressure ulcer of left hip, unstageable Modifier: Quantity: 1 Physician Procedures : CPT4 Code Description Modifier [id number] 11043 - WC PHYS DEBR MUSCLE/FASCIA 20 SQ CM ICD-10 Diagnosis Description L89.220 Pressure ulcer of left hip, unstageable Quantity: 1 Electronic Signature(s) Signed: 09/05/2023 3:33:32 PM By: [name] Najjar MD Entered By: [name] on  09/04/2023 14:04:19  non-bordered dressing only. Wound #6 - Calcaneus Wound Laterality: Right Cleanser: Vashe 5.8 (oz) (Home Health) 1 x Per Day/30 Days Discharge Instructions: damp to dry daily over Santyl Secondary Dressing: ABD Pad 5x9 (in/in) 1 x Per Day/30 Days Discharge Instructions: Cover with ABD pad Secondary Dressing: Kerlix 4.5 x 4.1 (in/yd) 1 x Per Day/30 Days Discharge Instructions: Apply Kerlix 4.5 x 4.1 (in/yd) as instructed Secured With: ACE WRAP - 81M ACE Elastic Bandage With VELCRO Brand Closure, 4 (in) 1 x Per Day/30 Days Devices Bed, Specialty - Group 3 bed and mattress Electronic Signature(s) Unsigned Previous Signature: 09/08/2023 11:21:49 AM Version By: [name] Najjar MD Previous Signature: 09/09/2023 4:31:16 PM Version By: [name] Previous Signature: 09/06/2023 10:28:30 AM Version By: [name] Previous Signature: 09/04/2023 5:46:43 PM Version By: [name] Previous Signature: 09/05/2023 3:33:32 PM Version By: [name] Najjar MD Entered By: [name] on 09/09/2023 13:41:47 Prescription  09/04/2023 -------------------------------------------------------------------------------- [name] Heimlich MD Patient Name: Provider: [birth year] [id number] Date of Birth: NPI#Jerral Ralph Sex: DEA #: [phone] Phone #: License #: [id number] UPN: Patient Address: [address] [facility] [place], [place] [address] [facility] [address] [place], [place] [address] [phone] Allergies Sulfa (Sulfonamide Antibiotics) Provider's Orders [name], [name] ([id number]) [id number] Page 7 of 13 Bed, Specialty - Group 3 bed and mattress Hand Signature: Date(s): Electronic Signature(s) Unsigned Previous Signature: 09/08/2023 11:21:49 AM Version By: [name] Najjar MD Previous Signature: 09/09/2023 4:31:16 PM Version By: [name] Entered By: [name] on 09/09/2023 13:41:48 -------------------------------------------------------------------------------- Problem List Details Patient Name: Date of Service: [facility], [name]. 09/04/2023 3:30 PM Medical Record Number: [id number] Patient Account Number: [id number] Date of Birth/Sex: Treating RN: 18-Mar-1941 (82 y.o. 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 09/26/2022 No Yes I89.0 Lymphedema, not elsewhere classified 09/26/2022 No Yes E11.622 Type 2 diabetes  mellitus with other skin ulcer 09/26/2022 No Yes L89.610 Pressure ulcer of right heel, unstageable 09/26/2022 No Yes L89.513 Pressure ulcer of right ankle, stage 3 12/05/2022 No Yes L89.620 Pressure ulcer of left heel, unstageable 12/05/2022 No Yes M86.172 Other acute osteomyelitis, left ankle and foot 10/24/2022 No Yes Markuson, Shalin [name] ([id number]) [id number] Page 8 of 13 L89.214 Pressure ulcer of right hip, stage 4 11/21/2022 No Yes L89.220 Pressure ulcer of left hip, unstageable 05/22/2023 No Yes I50.32 Chronic diastolic (congestive) heart failure 09/26/2022 No Yes Z79.84 Long term (current) use of oral hypoglycemic drugs 03/13/2023 No Yes Inactive Problems ICD-10 Code Description Active Date Inactive Date S81.802A Unspecified open wound, left lower leg, initial encounter 05/22/2023 05/22/2023 Resolved Problems ICD-10 Code Description Active Date Resolved Date L89.320 Pressure ulcer of left buttock, unstageable 09/26/2022 09/26/2022 S91.302A Unspecified open wound, left foot, initial encounter 10/24/2022 10/24/2022 Electronic Signature(s) Signed: 09/05/2023 3:33:32 PM By: [name] Najjar MD Entered By: [name] on 09/04/2023 14:00:12 -------------------------------------------------------------------------------- Progress Note Details Patient Name: Date of Service: [facility], [name]. 09/04/2023 3:30 PM Medical Record Number: [id number] Patient Account Number: [id number] Date of Birth/Sex:  ERNELL, SKALSKI ([id number]) [id number] Page 1 of 13 Visit Report for 09/04/2023 Debridement Details Patient Name: Date of Service: [name] [place] [name]. 09/04/2023 3:30 PM Medical Record Number: [id number] Patient Account Number: [id number] Date of Birth/Sex: Treating RN: Dec 05, 1940 (82 y.o. [name] Primary Care Provider: Barbette [name] Other Clinician: Betha [name] Referring Provider: Treating Provider/Extender: [name] Weeks in Treatment: 49 Debridement Performed for Assessment: Wound #16 Left Trochanter Performed By: Physician [name] Caul, MD The following information was scribed by: [name] The information was scribed for: Lauranne Beyersdorf G Debridement Type: Debridement Level of Consciousness (Pre-procedure): Awake and Alert Pre-procedure Verification/Time Out Yes - 16:52 Taken: Start Time: 16:52 Percent of Wound Bed Debrided: 100% T Area Debrided (cm): otal 1.7 Tissue and other material debrided: Viable, Non-Viable, Subcutaneous, Tendon Level: Skin/Subcutaneous Tissue/Muscle Debridement Description: Excisional Instrument: Curette Bleeding: Minimum Hemostasis Achieved: Pressure Response to Treatment: Procedure was tolerated well Level of Consciousness (Post- Awake and Alert procedure): Post Debridement Measurements of Total Wound Length: (cm) 1.8 Stage: Unstageable/Unclassified Width: (cm) 1.2 Depth: (cm) 1 Volume: (cm) 1.696 Character of Wound/Ulcer Post Debridement: Stable Post Procedure Diagnosis Same as Pre-procedure Electronic Signature(s) Signed: 09/06/2023 10:28:30 AM By: [name] Signed: 09/08/2023 11:21:49 AM By: [name] Najjar MD Signed: 09/09/2023 8:00:25 AM By: Yevonne Pax RN Previous Signature: 09/04/2023 5:46:43 PM Version By: [name] Previous Signature: 09/05/2023 3:33:32 PM Version By: [name] Najjar MD Entered By: [name] on 09/06/2023  07:27:33 Nolting, [name] ([id number]) [id number] Page 2 of 13 -------------------------------------------------------------------------------- HPI Details Patient Name: Date of Service: [facility], [name]. 09/04/2023 3:30 PM Medical Record Number: [id number] Patient Account Number: [id number] Date of Birth/Sex: Treating RN: 1941-03-25 (82 y.o. [name] Primary Care Provider: Barbette [name] Other Clinician: Betha [name] Referring Provider: Treating Provider/Extender: [name] Weeks in Treatment: 26 History of Present Illness HPI Description: 82 year old patient who comes with a referral for bilateral lower extremity edema and a lower extremity ulceration and has been sent by her PCP [name]. I understand the patient was recently put on amoxicillin and doxycycline but could not tolerate the amoxicillin. doxycycline course was completed. a BNP and EKG was supposed to be normal and the patient did not have any dyspnea. the patient has been on a diuretic. The patient was also prescribed a pair of elastic compression stockings of the 20-30 mmHg pressure variety. x-ray of the right ankle was done on 09/20/2015 and showed posttraumatic and postsurgical changes of the right ankle with secondary degenerative changes of the tibiotalar joint and to a lesser degree the subtalar joint. No definite acute bony abnormalities are noted. Past medical history significant for diabetes mellitus, hypertension, hyperlipidemia, right breast cancer treated with a mastectomy in 2014. She has never smoked. 10/24/2015 -- she had delayed her vascular test because of her husband surgery but she is now ready to get him taken care of. He is also unable to use compression stockings and hence we will need to order her Juzo wraps. 10/31/2015-- was seen by Dr. Wyn Quaker on 10/28/2015. She had a left lower extremity arterial duplex done at his office a  couple of years ago and that was essentially normal. Today they performed a venous duplex which revealed no evidence of deep vein thrombosis, superficial thrombophlebitis, no venous reflex was seen on the right and a minimal amount of reflux was seen on the left great saphenous vein but no significant reflux was  instructed Secured With: ACE WRAP - 68M ACE Elastic Bandage With VELCRO Brand Closure, 4 (in) 1 x Per Day/30 Days 1. I have changed the dressings on both greater trochanters to silver collagen backing hydrogel wet-to-dry border foam change every 2 2. Continue with the Vashe equivalent wet-to-dry on the bilateral lower extremity wounds all of which appear to be contracting 3. Follow-up in 2 weeks. 4. Consider a wound VAC especially on the right Electronic Signature(s) Signed: 09/05/2023 3:33:32 PM By: [name] Najjar MD Entered By: [name] on 09/04/2023 14:04:01 Rosiak, [name] ([id number]) [id number] Page 13 of 13 -------------------------------------------------------------------------------- SuperBill Details Patient Name: Date of Service: [facility], [name]. 09/04/2023 Medical Record Number: [id number] Patient Account Number: [id number] Date of Birth/Sex: Treating RN: 1941-05-26 (82 y.o. 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) with ulcer of left lower extremity I89.0 Lymphedema, not elsewhere classified E11.622 Type 2 diabetes mellitus with other skin ulcer L89.610 Pressure ulcer of right heel, unstageable L89.513 Pressure ulcer of right ankle, stage 3 L89.620 Pressure ulcer of left heel, unstageable M86.172 Other acute osteomyelitis, left ankle and foot L89.214 Pressure ulcer of right hip, stage 4 L89.220 Pressure ulcer of left hip, unstageable I50.32 Chronic diastolic (congestive) heart failure Z79.84 Long term (current) use of oral hypoglycemic drugs Facility Procedures : CPT4 Code: 21308657 Description: 11043 - DEB MUSC/FASCIA 20 SQ CM/< ICD-10 Diagnosis Description L89.220 Pressure ulcer of left hip, unstageable Modifier: Quantity: 1 Physician Procedures : CPT4 Code Description Modifier [id number] 11043 - WC PHYS DEBR MUSCLE/FASCIA 20 SQ CM ICD-10 Diagnosis Description L89.220 Pressure ulcer of left hip, unstageable Quantity: 1 Electronic Signature(s) Signed: 09/05/2023 3:33:32 PM By: [name] Najjar MD Entered By: [name] on  09/04/2023 14:04:19  instructed Secured With: ACE WRAP - 68M ACE Elastic Bandage With VELCRO Brand Closure, 4 (in) 1 x Per Day/30 Days 1. I have changed the dressings on both greater trochanters to silver collagen backing hydrogel wet-to-dry border foam change every 2 2. Continue with the Vashe equivalent wet-to-dry on the bilateral lower extremity wounds all of which appear to be contracting 3. Follow-up in 2 weeks. 4. Consider a wound VAC especially on the right Electronic Signature(s) Signed: 09/05/2023 3:33:32 PM By: [name] Najjar MD Entered By: [name] on 09/04/2023 14:04:01 Rosiak, [name] ([id number]) [id number] Page 13 of 13 -------------------------------------------------------------------------------- SuperBill Details Patient Name: Date of Service: [facility], [name]. 09/04/2023 Medical Record Number: [id number] Patient Account Number: [id number] Date of Birth/Sex: Treating RN: 1941-05-26 (82 y.o. 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) with ulcer of left lower extremity I89.0 Lymphedema, not elsewhere classified E11.622 Type 2 diabetes mellitus with other skin ulcer L89.610 Pressure ulcer of right heel, unstageable L89.513 Pressure ulcer of right ankle, stage 3 L89.620 Pressure ulcer of left heel, unstageable M86.172 Other acute osteomyelitis, left ankle and foot L89.214 Pressure ulcer of right hip, stage 4 L89.220 Pressure ulcer of left hip, unstageable I50.32 Chronic diastolic (congestive) heart failure Z79.84 Long term (current) use of oral hypoglycemic drugs Facility Procedures : CPT4 Code: 21308657 Description: 11043 - DEB MUSC/FASCIA 20 SQ CM/< ICD-10 Diagnosis Description L89.220 Pressure ulcer of left hip, unstageable Modifier: Quantity: 1 Physician Procedures : CPT4 Code Description Modifier [id number] 11043 - WC PHYS DEBR MUSCLE/FASCIA 20 SQ CM ICD-10 Diagnosis Description L89.220 Pressure ulcer of left hip, unstageable Quantity: 1 Electronic Signature(s) Signed: 09/05/2023 3:33:32 PM By: [name] Najjar MD Entered By: [name] on  09/04/2023 14:04:19  improved again using Vashe wet-to-dry. We have been attempting to get a level 3 surface or some form of offloading surface for the patient although having trouble with their insurance which apparently is [name] 10/16; patient is doing very well. The wounds on the lower extremity even the extensive areas seem to be a lot better with healthy granulation. We have been using the [employer] equivalent of Vashe wet-to-dry. There is areas over the lateral part of the greater trochanters. The area on the left looks a lot better we have been using Santyl with backing wet to dry gauze. We have been using Vashe wet-to-dry in the right greater trochanter. I think we are in line with changing the dressing next week to both hip areas [name], [name] ([id number]) [phone].pdf Page 4 of 13 We are apparently finally making some inroads in getting her a level 3 surface for her bed 10/23; patient continues to make nice improvement in the bilateral lower leg wounds using the equivalent of Vashe wet-to-dry. I did plan to change this dressing but the wounds are making such a nice improvement I continued it. T oday I have changed the greater trochanter stage IV  pressure ulcers to a Prisma and then hydrogel wet-to-dry packing with border foam change every 2 D. Really remarkable improvement credit to the family this looking after her Electronic Signature(s) Signed: 09/05/2023 3:33:32 PM By: [name] Najjar MD Entered By: [name] on 09/04/2023 14:01:18 -------------------------------------------------------------------------------- Physical Exam Details Patient Name: Date of Service: [facility] [place], [name]. 09/04/2023 3:30 PM Medical Record Number: [id number] Patient Account Number: [id number] Date of Birth/Sex: Treating RN: 12-Apr-1941 (82 y.o. [name] Primary Care Provider: Barbette [name] Other Clinician: Betha [name] Referring Provider: Treating Provider/Extender: [name] Weeks in Treatment: 49 Constitutional Sitting or standing Blood Pressure is within target range for patient.. Pulse regular and within target range for patient.Marland Kitchen Respirations regular, non-labored and within target range.. Temperature is normal and within the target range for the patient.Marland Kitchen appears in no distress. Notes Wound exam Right greater trochanter still has exposed bone and undermining at 12-2 however this seems to have come down. Healthy granulation. No evidence of surrounding infection Left greater trochanter has a smaller wound circumference. I used a #3 curette to remove subcutaneous tissue and tendon to get to viable granulation. We are going to change to silver collagen and backing hydrogel wet-to-dry The wounds on her bilateral heels are improved also the area on the medial left calcaneus. All the wounds on the lower extremity are improved Electronic Signature(s) Signed: 09/05/2023 3:33:32 PM By: [name] Najjar MD Entered By: [name] on 09/04/2023 14:02:33 -------------------------------------------------------------------------------- Physician Orders Details Patient Name: Date of Service: [name],  [name]. 09/04/2023 3:30 PM Medical Record Number: [id number] Patient Account Number: [id number] Date of Birth/Sex: Treating RN: Jul 22, 1941 (82 y.o. [name] Primary Care Provider: Barbette [name] Other Clinician: Betha [name] Referring Provider: Treating Provider/Extender: [name] Weeks in Treatment: 9 The following information was scribed by: [name] The information was scribed for: [name] Verbal / Phone Orders: Yes Clinician: Yevonne Pax Read Back and VerifiedHASLEY, [name] ([id number]) [phone].pdf Page 5 of 13 Diagnosis Coding Follow-up Appointments Return Appointment in 2 weeks. Home Health Home Health Company: - Harlin Rain, RN [phone] [facility] for wound care. May utilize formulary equivalent dressing for wound treatment orders unless otherwise specified. Home Health Nurse may visit PRN to

## 2023-09-09 NOTE — Progress Notes (Signed)
0 Hypo/Hyperglycemic Management (do not check if billed separately) []  - 0 Ankle / Brachial Index (ABI) - do not check if billed separately Has the patient been seen at the hospital within the last three years: Yes Total Score: 0 Level Of Care: ____ Electronic Signature(s) Signed: 08/21/2023 4:52:56 PM By: Betha Loa Entered By: Betha Loa on 08/21/2023 07:44:26 -------------------------------------------------------------------------------- Encounter Discharge Information Details Patient Name: Date of Service: [facility], [name] 08/21/2023 10:00 A M Medical Record Number: [id number] Patient Account Number: [id number] Date of Birth/Sex: Treating RN: 06-29-1941 (82 y.o. [name] Primary Care Breven Guidroz: Barbette Reichmann Other Clinician: Betha Loa Referring Canda Podgorski: Treating Leron Stoffers/Extender: [address] MICHA EL Allegra Grana Kalaheo, Shaneal L ([id number]) [id number] Page 3 of 15 Weeks in Treatment: 63 Encounter Discharge Information Items Post Procedure Vitals Discharge Condition: Stable Temperature (F): 97.5 Ambulatory Status: Wheelchair Pulse (bpm): 71 Discharge Destination: Home Respiratory Rate (breaths/min): 16 Transportation: Private Auto Blood Pressure (mmHg): 123/64 Accompanied By: son Schedule Follow-up Appointment: Yes Clinical Summary of Care: Electronic Signature(s) Signed: 08/21/2023 4:52:56 PM By: Betha Loa Entered By: Betha Loa on 08/21/2023 09:01:22 -------------------------------------------------------------------------------- Lower Extremity Assessment Details Patient Name: Date of Service: [facility], [name] 08/21/2023 10:00 A M Medical Record Number: [id number] Patient Account Number: [id number] Date of Birth/Sex: Treating RN: 1940-12-05 (82 y.o. [name] Primary Care Britania Shreeve: Barbette Reichmann Other Clinician: Betha Loa Referring Oluwaseyi Tull: Treating Elhadji Pecore/Extender: [name], [name], Vishwanath Weeks in Treatment: 47 Electronic Signature(s) Signed: 08/21/2023 4:52:56 PM By: Betha Loa Signed: 09/09/2023 8:01:26 AM By: Yevonne Pax RN Entered By: Betha Loa on 08/21/2023 07:26:38 -------------------------------------------------------------------------------- Multi Wound Chart Details Patient Name: Date of Service: [facility], [name] 08/21/2023 10:00 A M Medical Record Number: [id number] Patient Account Number: [id number] Date of Birth/Sex: Treating RN: [birth year] (82 y.o. [name] Primary Care Edson Deridder: Barbette Reichmann Other Clinician: Betha Loa Referring Disaya Walt: Treating Charlottie Peragine/Extender: [name], [name], Vishwanath Weeks in Treatment: 47 Vital Signs Height(in): Pulse(bpm): 71 Weight(lbs): 98 Blood Pressure(mmHg): 123/64 Body Mass Index(BMI): Temperature(F): 97.5 Respiratory Rate(breaths/min): 16 [Lashway, Casondra L ([id number]):Photos:] [[id number] Page 4 of 15:11 13 14] Right Trochanter Left Calcaneus Right, Distal, Medial Lower Leg Wound Location: Pressure Injury Pressure Injury Gradually Appeared Wounding Event: Pressure Ulcer Pressure Ulcer Diabetic Wound/Ulcer of the Lower Primary Etiology: Extremity Cataracts, Lymphedema, Cataracts, Lymphedema, Cataracts, Lymphedema, Comorbid History: Hypertension, Peripheral Arterial Hypertension, Peripheral Arterial Hypertension, Peripheral Arterial Disease, Peripheral Venous Disease, Disease, Peripheral Venous Disease, Disease, Peripheral Venous Disease, Type II Diabetes, Osteoarthritis, Type II Diabetes, Osteoarthritis, Type II Diabetes, Osteoarthritis, Neuropathy Neuropathy Neuropathy 10/24/2022 11/28/2022 03/10/2023 Date Acquired: 42 37 23 Weeks of Treatment: Open Open Open Wound  Status: No No No Wound Recurrence: No No Yes Clustered Wound: 2.5x2.9x1 0.3x0.4x0.2 7.5x3x0.1 Measurements L x W x D (cm) 5.694 0.094 17.671 A (cm) : rea 5.694 0.019 1.767 Volume (cm) : -174.70% 97.30% -221.40% % Reduction in A rea: -2650.70% 94.50% -221.30% % Reduction in Volume: 2 Starting Position 1 (o'clock): 8 Ending Position 1 (o'clock): 2.8 Maximum Distance 1 (cm): Yes N/A N/A Undermining: Category/Stage IV Category/Stage III Grade 2 Classification: Medium Medium Large Exudate A mount: Serosanguineous Serosanguineous Serosanguineous Exudate Type: red, brown red, brown red, brown Exudate Color: Well defined, not attached Flat and Intact N/A Wound Margin: Large (67-100%) Medium (34-66%) Large (67-100%) Granulation A mount: Red Pink Red Granulation Quality: None Present (0%) Medium (34-66%) Small (1-33%) Necrotic A mount: N/A Eschar, Adherent Slough Adherent Slough Necrotic Tissue:  0 Hypo/Hyperglycemic Management (do not check if billed separately) []  - 0 Ankle / Brachial Index (ABI) - do not check if billed separately Has the patient been seen at the hospital within the last three years: Yes Total Score: 0 Level Of Care: ____ Electronic Signature(s) Signed: 08/21/2023 4:52:56 PM By: Betha Loa Entered By: Betha Loa on 08/21/2023 07:44:26 -------------------------------------------------------------------------------- Encounter Discharge Information Details Patient Name: Date of Service: [facility], [name] 08/21/2023 10:00 A M Medical Record Number: [id number] Patient Account Number: [id number] Date of Birth/Sex: Treating RN: 06-29-1941 (82 y.o. [name] Primary Care Breven Guidroz: Barbette Reichmann Other Clinician: Betha Loa Referring Canda Podgorski: Treating Leron Stoffers/Extender: [address] MICHA EL Allegra Grana Kalaheo, Shaneal L ([id number]) [id number] Page 3 of 15 Weeks in Treatment: 63 Encounter Discharge Information Items Post Procedure Vitals Discharge Condition: Stable Temperature (F): 97.5 Ambulatory Status: Wheelchair Pulse (bpm): 71 Discharge Destination: Home Respiratory Rate (breaths/min): 16 Transportation: Private Auto Blood Pressure (mmHg): 123/64 Accompanied By: son Schedule Follow-up Appointment: Yes Clinical Summary of Care: Electronic Signature(s) Signed: 08/21/2023 4:52:56 PM By: Betha Loa Entered By: Betha Loa on 08/21/2023 09:01:22 -------------------------------------------------------------------------------- Lower Extremity Assessment Details Patient Name: Date of Service: [facility], [name] 08/21/2023 10:00 A M Medical Record Number: [id number] Patient Account Number: [id number] Date of Birth/Sex: Treating RN: 1940-12-05 (82 y.o. [name] Primary Care Britania Shreeve: Barbette Reichmann Other Clinician: Betha Loa Referring Oluwaseyi Tull: Treating Elhadji Pecore/Extender: [name], [name], Vishwanath Weeks in Treatment: 47 Electronic Signature(s) Signed: 08/21/2023 4:52:56 PM By: Betha Loa Signed: 09/09/2023 8:01:26 AM By: Yevonne Pax RN Entered By: Betha Loa on 08/21/2023 07:26:38 -------------------------------------------------------------------------------- Multi Wound Chart Details Patient Name: Date of Service: [facility], [name] 08/21/2023 10:00 A M Medical Record Number: [id number] Patient Account Number: [id number] Date of Birth/Sex: Treating RN: [birth year] (82 y.o. [name] Primary Care Edson Deridder: Barbette Reichmann Other Clinician: Betha Loa Referring Disaya Walt: Treating Charlottie Peragine/Extender: [name], [name], Vishwanath Weeks in Treatment: 47 Vital Signs Height(in): Pulse(bpm): 71 Weight(lbs): 98 Blood Pressure(mmHg): 123/64 Body Mass Index(BMI): Temperature(F): 97.5 Respiratory Rate(breaths/min): 16 [Lashway, Casondra L ([id number]):Photos:] [[id number] Page 4 of 15:11 13 14] Right Trochanter Left Calcaneus Right, Distal, Medial Lower Leg Wound Location: Pressure Injury Pressure Injury Gradually Appeared Wounding Event: Pressure Ulcer Pressure Ulcer Diabetic Wound/Ulcer of the Lower Primary Etiology: Extremity Cataracts, Lymphedema, Cataracts, Lymphedema, Cataracts, Lymphedema, Comorbid History: Hypertension, Peripheral Arterial Hypertension, Peripheral Arterial Hypertension, Peripheral Arterial Disease, Peripheral Venous Disease, Disease, Peripheral Venous Disease, Disease, Peripheral Venous Disease, Type II Diabetes, Osteoarthritis, Type II Diabetes, Osteoarthritis, Type II Diabetes, Osteoarthritis, Neuropathy Neuropathy Neuropathy 10/24/2022 11/28/2022 03/10/2023 Date Acquired: 42 37 23 Weeks of Treatment: Open Open Open Wound  Status: No No No Wound Recurrence: No No Yes Clustered Wound: 2.5x2.9x1 0.3x0.4x0.2 7.5x3x0.1 Measurements L x W x D (cm) 5.694 0.094 17.671 A (cm) : rea 5.694 0.019 1.767 Volume (cm) : -174.70% 97.30% -221.40% % Reduction in A rea: -2650.70% 94.50% -221.30% % Reduction in Volume: 2 Starting Position 1 (o'clock): 8 Ending Position 1 (o'clock): 2.8 Maximum Distance 1 (cm): Yes N/A N/A Undermining: Category/Stage IV Category/Stage III Grade 2 Classification: Medium Medium Large Exudate A mount: Serosanguineous Serosanguineous Serosanguineous Exudate Type: red, brown red, brown red, brown Exudate Color: Well defined, not attached Flat and Intact N/A Wound Margin: Large (67-100%) Medium (34-66%) Large (67-100%) Granulation A mount: Red Pink Red Granulation Quality: None Present (0%) Medium (34-66%) Small (1-33%) Necrotic A mount: N/A Eschar, Adherent Slough Adherent Slough Necrotic Tissue:  Fascia: Yes Fat Layer (Subcutaneous Tissue): Yes Fat Layer (Subcutaneous Tissue): Yes Exposed Structures: Fat Layer (Subcutaneous Tissue): Yes Fascia: No Fascia: No Tendon: Yes Tendon: No Tendon: No Muscle: Yes Muscle: No Muscle: No Joint: No Joint: No Joint: No Bone: No Bone: No Bone: No None None None Epithelialization: Wound Number: 15 [id number] 18  Photos: Left, Anterior Lower Leg Left Trochanter Right Metatarsal head first Wound Location: Pressure Injury Pressure Injury Pressure Injury Wounding Event: Pressure Ulcer Pressure Ulcer Pressure Ulcer Primary Etiology: Cataracts, Lymphedema, Cataracts, Lymphedema, Cataracts, Lymphedema, Comorbid History: Hypertension, Peripheral Arterial Hypertension, Peripheral Arterial Hypertension, Peripheral Arterial Disease, Peripheral Venous Disease, Disease, Peripheral Venous Disease, Disease, Peripheral Venous Disease, Type II Diabetes, Osteoarthritis, Type II Diabetes, Osteoarthritis, Type II Diabetes, Osteoarthritis, Neuropathy  Neuropathy Neuropathy 04/11/2023 05/22/2023 07/31/2023 Date Acquired: 18 13 3  Weeks of Treatment: Open Open Open Wound Status: No No No Wound Recurrence: No No No Clustered Wound: 2.4x1.6x0.1 1.6x2x1.2 0.5x0.6x0.2 Measurements L x W x D (cm) 3.016 2.513 0.236 A (cm) : rea 0.302 3.016 0.047 Volume (cm) : 86.60% 22.70% 68.40% % Reduction in A rea: 86.50% -828.00% 37.30% % Reduction in Volume: N/A N/A N/A Undermining: Category/Stage IV Unstageable/Unclassified Category/Stage II Classification: None Present Medium Medium Exudate A mount: N/A Serosanguineous Serosanguineous Exudate Type: N/A red, brown red, brown Exudate Color: Kotch, Najae L ([id number]) [id number] Page 5 of 15 N/A N/A N/A Wound Margin: None Present (0%) N/A Medium (34-66%) Granulation Amount: N/A N/A Red Granulation Quality: None Present (0%) N/A Medium (34-66%) Necrotic Amount: N/A Eschar, Adherent Slough Eschar, Adherent Slough Necrotic Tissue: Fat Layer (Subcutaneous Tissue): Yes Fat Layer (Subcutaneous Tissue): Yes Fat Layer (Subcutaneous Tissue): Yes Exposed Structures: Tendon: Yes Fascia: No Fascia: No Fascia: No Tendon: No Tendon: No Muscle: No Muscle: No Muscle: No Joint: No Joint: No Joint: No Bone: No Bone: No Bone: No Large (67-100%) N/A None Epithelialization: Wound Number: 6 N/A N/A Photos: N/A N/A Right Calcaneus N/A N/A Wound Location: Pressure Injury N/A N/A Wounding Event: Pressure Ulcer N/A N/A Primary Etiology: Cataracts, Lymphedema, N/A N/A Comorbid History: Hypertension, Peripheral Arterial Disease, Peripheral Venous Disease, Type II Diabetes, Osteoarthritis, Neuropathy 07/23/2022 N/A N/A Date Acquired: 65 N/A N/A Weeks of Treatment: Open N/A N/A Wound Status: No N/A N/A Wound Recurrence: No N/A N/A Clustered Wound: 1.9x1.5x0.1 N/A N/A Measurements L x W x D (cm) 2.238 N/A N/A A (cm) : rea 0.224 N/A N/A Volume (cm)  : 76.30% N/A N/A % Reduction in A rea: 92.10% N/A N/A % Reduction in Volume: N/A N/A N/A Undermining: Category/Stage III N/A N/A Classification: Medium N/A N/A Exudate A mount: Serosanguineous N/A N/A Exudate Type: red, brown N/A N/A Exudate Color: Distinct, outline attached N/A N/A Wound Margin: Medium (34-66%) N/A N/A Granulation A mount: Red, Pink N/A N/A Granulation Quality: Medium (34-66%) N/A N/A Necrotic A mount: Eschar, Adherent Slough N/A N/A Necrotic Tissue: Fat Layer (Subcutaneous Tissue): Yes N/A N/A Exposed Structures: Fascia: No Tendon: No Muscle: No Joint: No Bone: No None N/A N/A Epithelialization: Treatment Notes Electronic Signature(s) Signed: 08/21/2023 4:52:56 PM By: Betha Loa Entered By: Betha Loa on 08/21/2023 07:26:43 Linderman, [name] ([id number]) [id number] Page 6 of 15 -------------------------------------------------------------------------------- Multi-Disciplinary Care Plan Details Patient Name: Date of Service: [facility] [name] 08/21/2023 10:00 A M Medical Record Number: [id number] Patient Account Number: [id number] Date of Birth/Sex: Treating RN: 1941/06/22 (82 y.o. [name] Primary Care Edrei Norgaard: Barbette Reichmann Other Clinician: Betha Loa Referring Blayn Whetsell: Treating Jeffrie Stander/Extender: [name], [name], Vishwanath Weeks in Treatment:  37 History: Peripheral Venous Disease, Type II Diabetes, Osteoarthritis, Clustered Wound: No Chui, Brayleigh L ([id number]) [id number] Page 9 of 15 Clustered Wound: No Neuropathy Photos Wound Measurements Length: (cm) 0.3 Width: (cm) 0.4 Depth: (cm) 0.2 Area: (cm) 0.094 Volume: (cm) 0.019 % Reduction in Area: 97.3% % Reduction in Volume: 94.5% Epithelialization: None Wound Description Classification: Category/Stage III Wound Margin: Flat and Intact Exudate Amount: Medium Exudate Type: Serosanguineous Exudate Color: red, brown Foul Odor After Cleansing: No Slough/Fibrino Yes Wound Bed Granulation Amount: Medium (34-66%) Exposed Structure Granulation Quality: Pink Fascia Exposed: No Necrotic Amount: Medium (34-66%) Fat Layer (Subcutaneous Tissue) Exposed: Yes Necrotic Quality: Eschar, Adherent Slough Tendon Exposed: No Muscle Exposed: No Joint Exposed: No Bone Exposed: No Electronic Signature(s) Signed: 08/21/2023 4:52:56 PM By: Betha Loa Signed: 09/09/2023 8:01:26 AM By: Yevonne Pax RN Entered By: Betha Loa on 08/21/2023 07:23:29 -------------------------------------------------------------------------------- Wound Assessment Details Patient Name: Date of Service: [facility], [name] 08/21/2023 10:00 A M Medical Record Number: [id number] Patient Account Number: [id number] Date of Birth/Sex: Treating RN: 07-04-1941 (82 y.o. [name] Primary Care Cortny Bambach: Barbette Reichmann Other Clinician: Betha Loa Referring Savayah Waltrip: Treating Mickelle Goupil/Extender: [name], MICHA EL  G Hande, Vishwanath Weeks in Treatment: 47 Wound Status Wound Number: 14 Primary Diabetic Wound/Ulcer of the Lower Extremity Etiology: Wound Location: Right, Distal, Medial Lower Leg Wound Open Wounding Event: Gradually Appeared Status: Date Acquired: 03/10/2023 Comorbid Cataracts, Lymphedema, Hypertension, Peripheral Arterial Disease, Weeks Of Treatment: 23 History: Peripheral Venous Disease, Type II Diabetes, Osteoarthritis, Clustered Wound: Yes Soderquist, Layney L ([id number]) [id number] Page 10 of 15 Clustered Wound: Yes Neuropathy Photos Wound Measurements Length: (cm) 7.5 Width: (cm) 3 Depth: (cm) 0.1 Area: (cm) 17.671 Volume: (cm) 1.767 % Reduction in Area: -221.4% % Reduction in Volume: -221.3% Epithelialization: None Wound Description Classification: Grade 2 Exudate Amount: Large Exudate Type: Serosanguineous Exudate Color: red, brown Foul Odor After Cleansing: No Slough/Fibrino Yes Wound Bed Granulation Amount: Large (67-100%) Exposed Structure Granulation Quality: Red Fascia Exposed: No Necrotic Amount: Small (1-33%) Fat Layer (Subcutaneous Tissue) Exposed: Yes Necrotic Quality: Adherent Slough Tendon Exposed: No Muscle Exposed: No Joint Exposed: No Bone Exposed: No Electronic Signature(s) Signed: 08/21/2023 4:52:56 PM By: Betha Loa Signed: 09/09/2023 8:01:26 AM By: Yevonne Pax RN Entered By: Betha Loa on 08/21/2023 07:24:09 -------------------------------------------------------------------------------- Wound Assessment Details Patient Name: Date of Service: [facility], [name] 08/21/2023 10:00 A M Medical Record Number: [id number] Patient Account Number: [id number] Date of Birth/Sex: Treating RN: [birth year] (82 y.o. [name] Primary Care Angeleena Dueitt: Barbette Reichmann Other Clinician: Betha Loa Referring Batool Majid: Treating Ciro Tashiro/Extender: [name], [name], Vishwanath Weeks in Treatment:  47 Wound Status Wound Number: 15 Primary Pressure Ulcer Etiology: Wound Location: Left, Anterior Lower Leg Wound Open Wounding Event: Pressure Injury Status: Date Acquired: 04/11/2023 Comorbid Cataracts, Lymphedema, Hypertension, Peripheral Arterial Disease, Weeks Of Treatment: 18 History: Peripheral Venous Disease, Type II Diabetes, Osteoarthritis, Clustered Wound: No Neuropathy Vicuna, Alexyia L ([id number]) [id number] Page 11 of 15 Photos Wound Measurements Length: (cm) 2.4 Width: (cm) 1.6 Depth: (cm) 0.1 Area: (cm) 3.016 Volume: (cm) 0.302 % Reduction in Area: 86.6% % Reduction in Volume: 86.5% Epithelialization: Large (67-100%) Wound Description Classification: Category/Stage IV Exudate Amount: None Present Foul Odor After Cleansing: No Slough/Fibrino No Wound Bed Granulation Amount: None Present (0%) Exposed Structure Necrotic Amount: None Present (0%) Fascia Exposed: No Fat Layer (Subcutaneous Tissue) Exposed: Yes Tendon Exposed: Yes Muscle Exposed: No Joint Exposed: No Bone Exposed: No Electronic Signature(s) Signed: 08/21/2023 4:52:56 PM By:  37 History: Peripheral Venous Disease, Type II Diabetes, Osteoarthritis, Clustered Wound: No Chui, Brayleigh L ([id number]) [id number] Page 9 of 15 Clustered Wound: No Neuropathy Photos Wound Measurements Length: (cm) 0.3 Width: (cm) 0.4 Depth: (cm) 0.2 Area: (cm) 0.094 Volume: (cm) 0.019 % Reduction in Area: 97.3% % Reduction in Volume: 94.5% Epithelialization: None Wound Description Classification: Category/Stage III Wound Margin: Flat and Intact Exudate Amount: Medium Exudate Type: Serosanguineous Exudate Color: red, brown Foul Odor After Cleansing: No Slough/Fibrino Yes Wound Bed Granulation Amount: Medium (34-66%) Exposed Structure Granulation Quality: Pink Fascia Exposed: No Necrotic Amount: Medium (34-66%) Fat Layer (Subcutaneous Tissue) Exposed: Yes Necrotic Quality: Eschar, Adherent Slough Tendon Exposed: No Muscle Exposed: No Joint Exposed: No Bone Exposed: No Electronic Signature(s) Signed: 08/21/2023 4:52:56 PM By: Betha Loa Signed: 09/09/2023 8:01:26 AM By: Yevonne Pax RN Entered By: Betha Loa on 08/21/2023 07:23:29 -------------------------------------------------------------------------------- Wound Assessment Details Patient Name: Date of Service: [facility], [name] 08/21/2023 10:00 A M Medical Record Number: [id number] Patient Account Number: [id number] Date of Birth/Sex: Treating RN: 07-04-1941 (82 y.o. [name] Primary Care Cortny Bambach: Barbette Reichmann Other Clinician: Betha Loa Referring Savayah Waltrip: Treating Mickelle Goupil/Extender: [name], MICHA EL  G Hande, Vishwanath Weeks in Treatment: 47 Wound Status Wound Number: 14 Primary Diabetic Wound/Ulcer of the Lower Extremity Etiology: Wound Location: Right, Distal, Medial Lower Leg Wound Open Wounding Event: Gradually Appeared Status: Date Acquired: 03/10/2023 Comorbid Cataracts, Lymphedema, Hypertension, Peripheral Arterial Disease, Weeks Of Treatment: 23 History: Peripheral Venous Disease, Type II Diabetes, Osteoarthritis, Clustered Wound: Yes Soderquist, Layney L ([id number]) [id number] Page 10 of 15 Clustered Wound: Yes Neuropathy Photos Wound Measurements Length: (cm) 7.5 Width: (cm) 3 Depth: (cm) 0.1 Area: (cm) 17.671 Volume: (cm) 1.767 % Reduction in Area: -221.4% % Reduction in Volume: -221.3% Epithelialization: None Wound Description Classification: Grade 2 Exudate Amount: Large Exudate Type: Serosanguineous Exudate Color: red, brown Foul Odor After Cleansing: No Slough/Fibrino Yes Wound Bed Granulation Amount: Large (67-100%) Exposed Structure Granulation Quality: Red Fascia Exposed: No Necrotic Amount: Small (1-33%) Fat Layer (Subcutaneous Tissue) Exposed: Yes Necrotic Quality: Adherent Slough Tendon Exposed: No Muscle Exposed: No Joint Exposed: No Bone Exposed: No Electronic Signature(s) Signed: 08/21/2023 4:52:56 PM By: Betha Loa Signed: 09/09/2023 8:01:26 AM By: Yevonne Pax RN Entered By: Betha Loa on 08/21/2023 07:24:09 -------------------------------------------------------------------------------- Wound Assessment Details Patient Name: Date of Service: [facility], [name] 08/21/2023 10:00 A M Medical Record Number: [id number] Patient Account Number: [id number] Date of Birth/Sex: Treating RN: [birth year] (82 y.o. [name] Primary Care Angeleena Dueitt: Barbette Reichmann Other Clinician: Betha Loa Referring Batool Majid: Treating Ciro Tashiro/Extender: [name], [name], Vishwanath Weeks in Treatment:  47 Wound Status Wound Number: 15 Primary Pressure Ulcer Etiology: Wound Location: Left, Anterior Lower Leg Wound Open Wounding Event: Pressure Injury Status: Date Acquired: 04/11/2023 Comorbid Cataracts, Lymphedema, Hypertension, Peripheral Arterial Disease, Weeks Of Treatment: 18 History: Peripheral Venous Disease, Type II Diabetes, Osteoarthritis, Clustered Wound: No Neuropathy Vicuna, Alexyia L ([id number]) [id number] Page 11 of 15 Photos Wound Measurements Length: (cm) 2.4 Width: (cm) 1.6 Depth: (cm) 0.1 Area: (cm) 3.016 Volume: (cm) 0.302 % Reduction in Area: 86.6% % Reduction in Volume: 86.5% Epithelialization: Large (67-100%) Wound Description Classification: Category/Stage IV Exudate Amount: None Present Foul Odor After Cleansing: No Slough/Fibrino No Wound Bed Granulation Amount: None Present (0%) Exposed Structure Necrotic Amount: None Present (0%) Fascia Exposed: No Fat Layer (Subcutaneous Tissue) Exposed: Yes Tendon Exposed: Yes Muscle Exposed: No Joint Exposed: No Bone Exposed: No Electronic Signature(s) Signed: 08/21/2023 4:52:56 PM By:  37 History: Peripheral Venous Disease, Type II Diabetes, Osteoarthritis, Clustered Wound: No Chui, Brayleigh L ([id number]) [id number] Page 9 of 15 Clustered Wound: No Neuropathy Photos Wound Measurements Length: (cm) 0.3 Width: (cm) 0.4 Depth: (cm) 0.2 Area: (cm) 0.094 Volume: (cm) 0.019 % Reduction in Area: 97.3% % Reduction in Volume: 94.5% Epithelialization: None Wound Description Classification: Category/Stage III Wound Margin: Flat and Intact Exudate Amount: Medium Exudate Type: Serosanguineous Exudate Color: red, brown Foul Odor After Cleansing: No Slough/Fibrino Yes Wound Bed Granulation Amount: Medium (34-66%) Exposed Structure Granulation Quality: Pink Fascia Exposed: No Necrotic Amount: Medium (34-66%) Fat Layer (Subcutaneous Tissue) Exposed: Yes Necrotic Quality: Eschar, Adherent Slough Tendon Exposed: No Muscle Exposed: No Joint Exposed: No Bone Exposed: No Electronic Signature(s) Signed: 08/21/2023 4:52:56 PM By: Betha Loa Signed: 09/09/2023 8:01:26 AM By: Yevonne Pax RN Entered By: Betha Loa on 08/21/2023 07:23:29 -------------------------------------------------------------------------------- Wound Assessment Details Patient Name: Date of Service: [facility], [name] 08/21/2023 10:00 A M Medical Record Number: [id number] Patient Account Number: [id number] Date of Birth/Sex: Treating RN: 07-04-1941 (82 y.o. [name] Primary Care Cortny Bambach: Barbette Reichmann Other Clinician: Betha Loa Referring Savayah Waltrip: Treating Mickelle Goupil/Extender: [name], MICHA EL  G Hande, Vishwanath Weeks in Treatment: 47 Wound Status Wound Number: 14 Primary Diabetic Wound/Ulcer of the Lower Extremity Etiology: Wound Location: Right, Distal, Medial Lower Leg Wound Open Wounding Event: Gradually Appeared Status: Date Acquired: 03/10/2023 Comorbid Cataracts, Lymphedema, Hypertension, Peripheral Arterial Disease, Weeks Of Treatment: 23 History: Peripheral Venous Disease, Type II Diabetes, Osteoarthritis, Clustered Wound: Yes Soderquist, Layney L ([id number]) [id number] Page 10 of 15 Clustered Wound: Yes Neuropathy Photos Wound Measurements Length: (cm) 7.5 Width: (cm) 3 Depth: (cm) 0.1 Area: (cm) 17.671 Volume: (cm) 1.767 % Reduction in Area: -221.4% % Reduction in Volume: -221.3% Epithelialization: None Wound Description Classification: Grade 2 Exudate Amount: Large Exudate Type: Serosanguineous Exudate Color: red, brown Foul Odor After Cleansing: No Slough/Fibrino Yes Wound Bed Granulation Amount: Large (67-100%) Exposed Structure Granulation Quality: Red Fascia Exposed: No Necrotic Amount: Small (1-33%) Fat Layer (Subcutaneous Tissue) Exposed: Yes Necrotic Quality: Adherent Slough Tendon Exposed: No Muscle Exposed: No Joint Exposed: No Bone Exposed: No Electronic Signature(s) Signed: 08/21/2023 4:52:56 PM By: Betha Loa Signed: 09/09/2023 8:01:26 AM By: Yevonne Pax RN Entered By: Betha Loa on 08/21/2023 07:24:09 -------------------------------------------------------------------------------- Wound Assessment Details Patient Name: Date of Service: [facility], [name] 08/21/2023 10:00 A M Medical Record Number: [id number] Patient Account Number: [id number] Date of Birth/Sex: Treating RN: [birth year] (82 y.o. [name] Primary Care Angeleena Dueitt: Barbette Reichmann Other Clinician: Betha Loa Referring Batool Majid: Treating Ciro Tashiro/Extender: [name], [name], Vishwanath Weeks in Treatment:  47 Wound Status Wound Number: 15 Primary Pressure Ulcer Etiology: Wound Location: Left, Anterior Lower Leg Wound Open Wounding Event: Pressure Injury Status: Date Acquired: 04/11/2023 Comorbid Cataracts, Lymphedema, Hypertension, Peripheral Arterial Disease, Weeks Of Treatment: 18 History: Peripheral Venous Disease, Type II Diabetes, Osteoarthritis, Clustered Wound: No Neuropathy Vicuna, Alexyia L ([id number]) [id number] Page 11 of 15 Photos Wound Measurements Length: (cm) 2.4 Width: (cm) 1.6 Depth: (cm) 0.1 Area: (cm) 3.016 Volume: (cm) 0.302 % Reduction in Area: 86.6% % Reduction in Volume: 86.5% Epithelialization: Large (67-100%) Wound Description Classification: Category/Stage IV Exudate Amount: None Present Foul Odor After Cleansing: No Slough/Fibrino No Wound Bed Granulation Amount: None Present (0%) Exposed Structure Necrotic Amount: None Present (0%) Fascia Exposed: No Fat Layer (Subcutaneous Tissue) Exposed: Yes Tendon Exposed: Yes Muscle Exposed: No Joint Exposed: No Bone Exposed: No Electronic Signature(s) Signed: 08/21/2023 4:52:56 PM By:  Betha Loa Signed: 09/09/2023 8:01:26 AM By: Yevonne Pax RN Entered By: Betha Loa on 08/21/2023 07:24:47 -------------------------------------------------------------------------------- Wound Assessment Details Patient Name: Date of Service: [facility], [name] 08/21/2023 10:00 A M Medical Record Number: [id number] Patient Account Number: [id number] Date of Birth/Sex: Treating RN: [birth year] (82 y.o. [name] Primary Care Corinn Stoltzfus: Barbette Reichmann Other Clinician: Betha Loa Referring Isaly Fasching: Treating Kimberley Dastrup/Extender: [name], [name], Vishwanath Weeks in Treatment: 47 Wound Status Wound Number: 16 Primary Pressure Ulcer Etiology: Wound Location: Left Trochanter Wound Open Wounding Event: Pressure Injury Status: Date Acquired: 05/22/2023 Comorbid  Cataracts, Lymphedema, Hypertension, Peripheral Arterial Disease, Weeks Of Treatment: 13 History: Peripheral Venous Disease, Type II Diabetes, Osteoarthritis, Clustered Wound: No Neuropathy Photos Townley, Lilyann L ([id number]) [id number] Page 12 of 15 Wound Measurements Length: (cm) 1.6 Width: (cm) 2 Depth: (cm) 1.2 Area: (cm) 2.513 Volume: (cm) 3.016 % Reduction in Area: 22.7% % Reduction in Volume: -828% Wound Description Classification: Unstageable/Unclassified Exudate Amount: Medium Exudate Type: Serosanguineous Exudate Color: red, brown Foul Odor After Cleansing: No Slough/Fibrino Yes Wound Bed Necrotic Amount: Large (67-100%) Exposed Structure Necrotic Quality: Eschar, Adherent Slough Fascia Exposed: No Fat Layer (Subcutaneous Tissue) Exposed: Yes Tendon Exposed: No Muscle Exposed: No Joint Exposed: No Bone Exposed: No Electronic Signature(s) Signed: 08/21/2023 4:52:56 PM By: Betha Loa Signed: 09/09/2023 8:01:26 AM By: Yevonne Pax RN Entered By: Betha Loa on 08/21/2023 07:25:22 -------------------------------------------------------------------------------- Wound Assessment Details Patient Name: Date of Service: [facility], [name] 08/21/2023 10:00 A M Medical Record Number: [id number] Patient Account Number: [id number] Date of Birth/Sex: Treating RN: 06/01/1941 (82 y.o. [name] Primary Care K[place]igh [place]: Barbette Reichmann Other Clinician: Betha Loa Referring Areonna Bran: Treating Cori Henningsen/Extender: [name], [name], Vishwanath Weeks in Treatment: 47 Wound Status Wound Number: 18 Primary Pressure Ulcer Etiology: Wound Location: Right Metatarsal head first Wound Open Wounding Event: Pressure Injury Status: Date Acquired: 07/31/2023 Comorbid Cataracts, Lymphedema, Hypertension, Peripheral Arterial Disease, Weeks Of Treatment: 3 History: Peripheral Venous Disease, Type II Diabetes,  Osteoarthritis, Clustered Wound: No Neuropathy Photos Girgis, Savana L ([id number]) [id number] Page 13 of 15 Wound Measurements Length: (cm) 0.5 Width: (cm) 0.6 Depth: (cm) 0.2 Area: (cm) 0.236 Volume: (cm) 0.047 % Reduction in Area: 68.4% % Reduction in Volume: 37.3% Epithelialization: None Wound Description Classification: Category/Stage II Exudate Amount: Medium Exudate Type: Serosanguineous Exudate Color: red, brown Foul Odor After Cleansing: No Slough/Fibrino Yes Wound Bed Granulation Amount: Medium (34-66%) Exposed Structure Granulation Quality: Red Fascia Exposed: No Necrotic Amount: Medium (34-66%) Fat Layer (Subcutaneous Tissue) Exposed: Yes Necrotic Quality: Eschar, Adherent Slough Tendon Exposed: No Muscle Exposed: No Joint Exposed: No Bone Exposed: No Electronic Signature(s) Signed: 08/21/2023 4:52:56 PM By: Betha Loa Signed: 09/09/2023 8:01:26 AM By: Yevonne Pax RN Entered By: Betha Loa on 08/21/2023 07:25:56 -------------------------------------------------------------------------------- Wound Assessment Details Patient Name: Date of Service: [facility], [name] 08/21/2023 10:00 A M Medical Record Number: [id number] Patient Account Number: [id number] Date of Birth/Sex: Treating RN: 01-Aug-1941 (82 y.o. [name] Primary Care Bladimir Auman: Barbette Reichmann Other Clinician: Betha Loa Referring Hira Trent: Treating Abir Craine/Extender: [name], [name], Vishwanath Weeks in Treatment: 47 Wound Status Wound Number: 6 Primary Pressure Ulcer Etiology: Wound Location: Right Calcaneus Wound Open Wounding Event: Pressure Injury Status: Date Acquired: 07/23/2022 Comorbid Cataracts, Lymphedema, Hypertension, Peripheral Arterial Disease, Weeks Of Treatment: 47 History: Peripheral Venous Disease, Type II Diabetes, Osteoarthritis, Clustered Wound: No Neuropathy Photos Liskey, Destynie L ([id number])  [id number] Page 14 of 15 Wound Measurements

## 2023-09-09 NOTE — Progress Notes (Signed)
Hypo/Hyperglycemic Management (do not check if billed separately) []  - 0 Ankle / Brachial Index (ABI) - do not check if billed separately Has the patient been seen at the hospital within the last three years: Yes Total Score: 0 Level Of Care: ____ Electronic Signature(s) Signed: 09/04/2023 5:46:43 PM By: Betha Loa Entered By: Betha Loa on 09/04/2023 14:21:01 -------------------------------------------------------------------------------- Encounter Discharge Information Details Patient Name: Date of Service: [facility], [name] 09/04/2023 3:30 PM Medical Record Number: [id number] Patient Account Number: [id number] Date of Birth/Sex: Treating RN: [birth year] (82 y.o. [name] Primary Care Braylin Formby: Barbette Reichmann Other Clinician: Betha Loa Referring Jaritza Duignan: Treating Sena Hoopingarner/Extender: [address] MICHA EL Allegra Grana [place], Willma L ([id number]) [id number] Page 3 of 17 Weeks in Treatment: 3 Encounter Discharge Information Items Post Procedure Vitals Discharge Condition: Stable Temperature (F): 98.0 Ambulatory Status: Wheelchair Pulse (bpm): 99 Discharge Destination: Home Respiratory Rate (breaths/min): 18 Transportation: Private Auto Blood Pressure (mmHg): 119/76 Accompanied By: son Schedule Follow-up Appointment: Yes Clinical Summary of Care: Electronic Signature(s) Signed: 09/04/2023 5:46:43 PM By: Betha Loa Entered By: Betha Loa on 09/04/2023 14:22:28 -------------------------------------------------------------------------------- Lower Extremity Assessment Details Patient Name: Date of Service: [facility], [name] 09/04/2023 3:30 PM Medical Record Number: [id number] Patient Account Number: [id number] Date of Birth/Sex: Treating RN: 11-Mar-1941 (82 y.o. [name] Primary Care Laraya Pestka: Barbette Reichmann Other Clinician: Betha Loa Referring Mical Brun: Treating Lucillie Kiesel/Extender: [name], [name], Vishwanath Weeks in Treatment: 49 Electronic Signature(s) Signed: 09/04/2023 5:46:43 PM By: Betha Loa Signed: 09/09/2023 8:00:25 AM By: Yevonne Pax RN Entered By: Betha Loa on 09/04/2023 13:46:06 -------------------------------------------------------------------------------- Multi Wound Chart Details Patient Name: Date of Service: [facility], [name] 09/04/2023 3:30 PM Medical Record Number: [id number] Patient Account Number: [id number] Date of Birth/Sex: Treating RN: 02/01/1941 (82 y.o. [name] Primary Care Dominico Rod: Barbette Reichmann Other Clinician: Betha Loa Referring Alejo Beamer: Treating Anabel Lykins/Extender: [name], [name], Vishwanath Weeks in Treatment: 49 Vital Signs Height(in): Pulse(bpm): 99 Weight(lbs): 98 Blood Pressure(mmHg): 119/76 Body Mass Index(BMI): Temperature(F): 98.0 Respiratory Rate(breaths/min): 18 [Minney, Seymone L ([id number]):Photos:] [phone].pdf Page 4 of 17:11 13 14] Right Trochanter Left Calcaneus Right, Distal, Medial Lower Leg Wound Location: Pressure Injury Pressure Injury Gradually Appeared Wounding Event: Pressure Ulcer Pressure Ulcer Diabetic Wound/Ulcer of the Lower Primary Etiology: Extremity Cataracts, Lymphedema, Cataracts, Lymphedema, Cataracts, Lymphedema, Comorbid History: Hypertension, Peripheral Arterial Hypertension, Peripheral Arterial Hypertension, Peripheral Arterial Disease, Peripheral Venous Disease, Disease, Peripheral Venous Disease, Disease, Peripheral Venous Disease, Type II Diabetes, Osteoarthritis, Type II Diabetes, Osteoarthritis, Type II Diabetes, Osteoarthritis, Neuropathy Neuropathy Neuropathy 10/24/2022 11/28/2022 03/10/2023 Date Acquired: 44 39 25 Weeks of Treatment: Open Open Open Wound  Status: No No No Wound Recurrence: No No Yes Clustered Wound: 3x2x1 0.3x0.4x0.2 3.5x3.5x0.1 Measurements L x W x D (cm) 4.712 0.094 9.621 A (cm) : rea 4.712 0.019 0.962 Volume (cm) : -127.30% 97.30% -75.00% % Reduction in A rea: -2176.30% 94.50% -74.90% % Reduction in Volume: 3 Starting Position 1 (o'clock): 7 Ending Position 1 (o'clock): 1.7 Maximum Distance 1 (cm): Yes N/A N/A Undermining: Category/Stage IV Category/Stage III Grade 2 Classification: Medium Medium Large Exudate A mount: Serosanguineous Serosanguineous Serosanguineous Exudate Type: red, brown red, brown red, brown Exudate Color: Well defined, not attached Flat and Intact N/A Wound Margin: Large (67-100%) Medium (34-66%) Large (67-100%) Granulation A mount: Red Pink Red Granulation Quality: None Present (0%) Medium (34-66%) Small (1-33%) Necrotic A mount: N/A Eschar, Adherent Slough Adherent Slough Necrotic Tissue: Fascia: Yes Fat Layer (  Wound #14 (Lower Leg) Wound Laterality: Right, Medial, Distal Cleanser Vashe 5.8 (oz) Discharge Instruction: damp to dry daily Peri-Wound Care Topical Primary Dressing Secondary Dressing ABD Pad 5x9 (in/in) Discharge Instruction: Cover with ABD pad Kerlix 4.5 x 4.1 (in/yd) Discharge Instruction: Apply Kerlix 4.5 x 4.1 (in/yd) as instructed Secured With ACE WRAP - 71M ACE Elastic Bandage With VELCRO Brand Closure, 4 (in) Compression Wrap Compression Stockings Add-Ons Electronic Signature(s) Signed: 09/04/2023 5:46:43 PM By: Betha Loa Signed: 09/09/2023 8:00:25 AM By: Yevonne Pax RN Entered By: Betha Loa on 09/04/2023 13:43:26 Burdett, [name] ([id number]) [id number].pdf Page 12 of 17 -------------------------------------------------------------------------------- Wound Assessment Details Patient Name: Date of Service: [facility],  [name] 09/04/2023 3:30 PM Medical Record Number: [id number] Patient Account Number: [id number] Date of Birth/Sex: Treating RN: 07/19/1941 (82 y.o. [name] Primary Care Lachelle Rissler: Barbette Reichmann Other Clinician: Betha Loa Referring Cearra Portnoy: Treating Dresean Beckel/Extender: [name], [name], Vishwanath Weeks in Treatment: 49 Wound Status Wound Number: 15 Primary Pressure Ulcer Etiology: Wound Location: Left, Anterior Lower Leg Wound Open Wounding Event: Pressure Injury Status: Date Acquired: 04/11/2023 Comorbid Cataracts, Lymphedema, Hypertension, Peripheral Arterial Disease, Weeks Of Treatment: 20 History: Peripheral Venous Disease, Type II Diabetes, Osteoarthritis, Clustered Wound: No Neuropathy Photos Wound Measurements Length: (cm) 1 Width: (cm) 1 Depth: (cm) 0 Area: (cm) Volume: (cm) % Reduction in Area: 95.8% .2 % Reduction in Volume: 95.8% .1 Epithelialization: Large (67-100%) 0.942 0.094 Wound Description Classification: Category/Stage IV Exudate Amount: None Present Foul Odor After Cleansing: No Slough/Fibrino No Wound Bed Granulation Amount: None Present (0%) Exposed Structure Necrotic Amount: None Present (0%) Fascia Exposed: No Fat Layer (Subcutaneous Tissue) Exposed: Yes Tendon Exposed: Yes Muscle Exposed: No Joint Exposed: No Bone Exposed: No Treatment Notes Wound #15 (Lower Leg) Wound Laterality: Left, Anterior Cleanser Vashe 5.8 (oz) Discharge Instruction: damp to dry daily Peri-Wound Care AMAIAH, VANDERHEIDEN L ([id number]) [id number].pdf Page 13 of 17 Topical Primary Dressing Secondary Dressing ABD Pad 5x9 (in/in) Discharge Instruction: Cover with ABD pad Kerlix 4.5 x 4.1 (in/yd) Discharge Instruction: Apply Kerlix 4.5 x 4.1 (in/yd) as instructed Secured With ACE WRAP - 71M ACE Elastic Bandage With VELCRO Brand Closure, 4 (in) Compression Wrap Compression Stockings Add-Ons Electronic  Signature(s) Signed: 09/04/2023 5:46:43 PM By: Betha Loa Signed: 09/09/2023 8:00:25 AM By: Yevonne Pax RN Entered By: Betha Loa on 09/04/2023 13:44:01 -------------------------------------------------------------------------------- Wound Assessment Details Patient Name: Date of Service: [facility], [name] 09/04/2023 3:30 PM Medical Record Number: [id number] Patient Account Number: [id number] Date of Birth/Sex: Treating RN: 08-May-1941 (82 y.o. [name] Primary Care Ahana Najera: Barbette Reichmann Other Clinician: Betha Loa Referring Alazay Leicht: Treating Ajene Carchi/Extender: [name], [name], Vishwanath Weeks in Treatment: 49 Wound Status Wound Number: 16 Primary Pressure Ulcer Etiology: Wound Location: Left Trochanter Wound Open Wounding Event: Pressure Injury Status: Date Acquired: 05/22/2023 Comorbid Cataracts, Lymphedema, Hypertension, Peripheral Arterial Disease, Weeks Of Treatment: 15 History: Peripheral Venous Disease, Type II Diabetes, Osteoarthritis, Clustered Wound: No Neuropathy Photos Wound Measurements Length: (cm) 1.8 Width: (cm) 1.2 Depth: (cm) 1 Area: (cm) 1.696 Volume: (cm) 1.696 [place], Dorrie L ([id number]) Wound Description Classification: Unstageable/Unclassified Exudate Amount: Medium Exudate Type: Serosanguineous Exudate Color: red, brown Foul Odor After Cleansing: No Slough/Fibrino Yes % Reduction in Area: 47.8% % Reduction in Volume: -421.8% [id number] Page 14 of 17 Wound Bed Necrotic Amount: Large (67-100%) Exposed Structure Necrotic Quality: Eschar, Adherent Slough Fascia Exposed: No Fat Layer (Subcutaneous Tissue) Exposed: Yes Tendon Exposed: No Muscle Exposed: No Joint Exposed: No  Wound Description Classification: Category/Stage III Wound Margin: Distinct, outline attached Exudate Amount: Medium Exudate Type: Serosanguineous Exudate Color: red, brown Foul Odor After Cleansing: No Slough/Fibrino Yes Wound Bed Granulation Amount: Medium (34-66%) Exposed Structure Granulation Quality: Red, Pink Fascia Exposed: No Necrotic Amount: Medium (34-66%) Fat Layer (Subcutaneous Tissue) Exposed: Yes Necrotic Quality: Eschar, Adherent Slough Tendon Exposed: No Muscle Exposed: No Joint Exposed: No Bone Exposed: No Treatment Notes Wound #6 (Calcaneus) Wound Laterality: Right Rosenow, Miyako L ([id number]) [id number] Page 17 of 17 Cleanser Vashe 5.8 (oz) Discharge Instruction: damp to dry daily over Santyl Peri-Wound Care Topical Primary Dressing Secondary Dressing ABD Pad 5x9 (in/in) Discharge Instruction: Cover with ABD pad Kerlix 4.5 x 4.1 (in/yd) Discharge Instruction: Apply Kerlix 4.5 x 4.1 (in/yd) as instructed Secured With ACE WRAP - 88M ACE Elastic Bandage With VELCRO Brand Closure, 4 (in) Compression Wrap Compression Stockings Add-Ons Electronic Signature(s) Signed: 09/04/2023 5:46:43 PM By: Betha Loa Signed: 09/09/2023 8:00:25 AM By: Yevonne Pax RN Entered By: Betha Loa on 09/04/2023 13:45:54 -------------------------------------------------------------------------------- Vitals Details Patient  Name: Date of Service: [facility], [name] 09/04/2023 3:30 PM Medical Record Number: [id number] Patient Account Number: [id number] Date of Birth/Sex: Treating RN: Aug 08, 1941 (82 y.o. [name] Primary Care Shanyla Marconi: Barbette Reichmann Other Clinician: Betha Loa Referring Jaedah Lords: Treating Arlon Bleier/Extender: [name], [name], Vishwanath Weeks in Treatment: 49 Vital Signs Time Taken: 16:19 Temperature (F): 98.0 Weight (lbs): 98 Pulse (bpm): 99 Respiratory Rate (breaths/min): 18 Blood Pressure (mmHg): 119/76 Reference Range: 80 - 120 mg / dl Electronic Signature(s) Signed: 09/04/2023 5:46:43 PM By: Betha Loa Entered By: Betha Loa on 09/04/2023 13:20:53  Subcutaneous Tissue): Yes Fat Layer (Subcutaneous Tissue): Yes Exposed Structures: Fat Layer (Subcutaneous Tissue): Yes Fascia: No Fascia: No Tendon: Yes Tendon: No Tendon: No Muscle: Yes Muscle: No Muscle: No Bone: Yes Joint: No Joint: No Joint: No Bone: No Bone: No None None None Epithelialization: Wound Number: 15 16 18  Photos: Left, Anterior Lower Leg Left Trochanter Right Metatarsal head first Wound Location: Pressure Injury Pressure Injury Pressure Injury Wounding Event: Pressure Ulcer Pressure Ulcer Pressure Ulcer Primary Etiology: Cataracts, Lymphedema, Cataracts, Lymphedema, Cataracts, Lymphedema, Comorbid History: Hypertension, Peripheral Arterial Hypertension, Peripheral Arterial Hypertension, Peripheral Arterial Disease, Peripheral Venous Disease, Disease, Peripheral Venous Disease, Disease, Peripheral Venous Disease, Type II Diabetes, Osteoarthritis, Type II Diabetes, Osteoarthritis, Type II Diabetes, Osteoarthritis, Neuropathy  Neuropathy Neuropathy 04/11/2023 05/22/2023 07/31/2023 Date Acquired: 20 15 5  Weeks of Treatment: Open Open Open Wound Status: No No No Wound Recurrence: No No No Clustered Wound: 1x1.2x0.1 1.8x1.2x1 0.4x0.5x0.1 Measurements L x W x D (cm) 0.942 1.696 0.157 A (cm) : rea 0.094 1.696 0.016 Volume (cm) : 95.80% 47.80% 79.00% % Reduction in A rea: 95.80% -421.80% 78.70% % Reduction in Volume: N/A N/A N/A Undermining: Category/Stage IV Unstageable/Unclassified Category/Stage II Classification: None Present Medium Medium Exudate A mount: N/A Serosanguineous Serosanguineous Exudate Type: N/A red, brown red, brown Exudate Color: Marschner, Taimi L ([id number]) [id number] Page 5 of 17 N/A N/A N/A Wound Margin: None Present (0%) N/A Medium (34-66%) Granulation Amount: N/A N/A Red Granulation Quality: None Present (0%) N/A Medium (34-66%) Necrotic Amount: N/A Eschar, Adherent Slough Eschar, Adherent Slough Necrotic Tissue: Fat Layer (Subcutaneous Tissue): Yes Fat Layer (Subcutaneous Tissue): Yes Fat Layer (Subcutaneous Tissue): Yes Exposed Structures: Tendon: Yes Fascia: No Fascia: No Fascia: No Tendon: No Tendon: No Muscle: No Muscle: No Muscle: No Joint: No Joint: No Joint: No Bone: No Bone: No Bone: No Large (67-100%) N/A None Epithelialization: Wound Number: 6 N/A N/A Photos: N/A N/A Right Calcaneus N/A N/A Wound Location: Pressure Injury N/A N/A Wounding Event: Pressure Ulcer N/A N/A Primary Etiology: Cataracts, Lymphedema, N/A N/A Comorbid History: Hypertension, Peripheral Arterial Disease, Peripheral Venous Disease, Type II Diabetes, Osteoarthritis, Neuropathy 07/23/2022 N/A N/A Date Acquired: 66 N/A N/A Weeks of Treatment: Open N/A N/A Wound Status: No N/A N/A Wound Recurrence: No N/A N/A Clustered Wound: 1.9x2x0.1 N/A N/A Measurements L x W x D (cm) 2.985 N/A N/A A (cm) : rea 0.298 N/A N/A Volume (cm)  : 68.30% N/A N/A % Reduction in A rea: 89.50% N/A N/A % Reduction in Volume: N/A N/A N/A Undermining: Category/Stage III N/A N/A Classification: Medium N/A N/A Exudate A mount: Serosanguineous N/A N/A Exudate Type: red, brown N/A N/A Exudate Color: Distinct, outline attached N/A N/A Wound Margin: Medium (34-66%) N/A N/A Granulation A mount: Red, Pink N/A N/A Granulation Quality: Medium (34-66%) N/A N/A Necrotic A mount: Eschar, Adherent Slough N/A N/A Necrotic Tissue: Fat Layer (Subcutaneous Tissue): Yes N/A N/A Exposed Structures: Fascia: No Tendon: No Muscle: No Joint: No Bone: No None N/A N/A Epithelialization: Treatment Notes Electronic Signature(s) Signed: 09/04/2023 5:46:43 PM By: Betha Loa Entered By: Betha Loa on 09/04/2023 13:46:10 Prescott, [name] ([id number]) [id number] Page 6 of 17 -------------------------------------------------------------------------------- Multi-Disciplinary Care Plan Details Patient Name: Date of Service: [facility], [name] 09/04/2023 3:30 PM Medical Record Number: [id number] Patient Account Number: [id number] Date of Birth/Sex: Treating RN: [birth year] (82 y.o. [name] Primary Care Amaury Kuzel: Barbette Reichmann Other Clinician: Betha Loa Referring Devaney Segers: Treating Xai Frerking/Extender: [name], [name], Vishwanath Weeks in Treatment: 57 Active Inactive Electronic Signature(s)  Hypo/Hyperglycemic Management (do not check if billed separately) []  - 0 Ankle / Brachial Index (ABI) - do not check if billed separately Has the patient been seen at the hospital within the last three years: Yes Total Score: 0 Level Of Care: ____ Electronic Signature(s) Signed: 09/04/2023 5:46:43 PM By: Betha Loa Entered By: Betha Loa on 09/04/2023 14:21:01 -------------------------------------------------------------------------------- Encounter Discharge Information Details Patient Name: Date of Service: [facility], [name] 09/04/2023 3:30 PM Medical Record Number: [id number] Patient Account Number: [id number] Date of Birth/Sex: Treating RN: [birth year] (82 y.o. [name] Primary Care Braylin Formby: Barbette Reichmann Other Clinician: Betha Loa Referring Jaritza Duignan: Treating Sena Hoopingarner/Extender: [address] MICHA EL Allegra Grana [place], Willma L ([id number]) [id number] Page 3 of 17 Weeks in Treatment: 3 Encounter Discharge Information Items Post Procedure Vitals Discharge Condition: Stable Temperature (F): 98.0 Ambulatory Status: Wheelchair Pulse (bpm): 99 Discharge Destination: Home Respiratory Rate (breaths/min): 18 Transportation: Private Auto Blood Pressure (mmHg): 119/76 Accompanied By: son Schedule Follow-up Appointment: Yes Clinical Summary of Care: Electronic Signature(s) Signed: 09/04/2023 5:46:43 PM By: Betha Loa Entered By: Betha Loa on 09/04/2023 14:22:28 -------------------------------------------------------------------------------- Lower Extremity Assessment Details Patient Name: Date of Service: [facility], [name] 09/04/2023 3:30 PM Medical Record Number: [id number] Patient Account Number: [id number] Date of Birth/Sex: Treating RN: 11-Mar-1941 (82 y.o. [name] Primary Care Laraya Pestka: Barbette Reichmann Other Clinician: Betha Loa Referring Mical Brun: Treating Lucillie Kiesel/Extender: [name], [name], Vishwanath Weeks in Treatment: 49 Electronic Signature(s) Signed: 09/04/2023 5:46:43 PM By: Betha Loa Signed: 09/09/2023 8:00:25 AM By: Yevonne Pax RN Entered By: Betha Loa on 09/04/2023 13:46:06 -------------------------------------------------------------------------------- Multi Wound Chart Details Patient Name: Date of Service: [facility], [name] 09/04/2023 3:30 PM Medical Record Number: [id number] Patient Account Number: [id number] Date of Birth/Sex: Treating RN: 02/01/1941 (82 y.o. [name] Primary Care Dominico Rod: Barbette Reichmann Other Clinician: Betha Loa Referring Alejo Beamer: Treating Anabel Lykins/Extender: [name], [name], Vishwanath Weeks in Treatment: 49 Vital Signs Height(in): Pulse(bpm): 99 Weight(lbs): 98 Blood Pressure(mmHg): 119/76 Body Mass Index(BMI): Temperature(F): 98.0 Respiratory Rate(breaths/min): 18 [Minney, Seymone L ([id number]):Photos:] [phone].pdf Page 4 of 17:11 13 14] Right Trochanter Left Calcaneus Right, Distal, Medial Lower Leg Wound Location: Pressure Injury Pressure Injury Gradually Appeared Wounding Event: Pressure Ulcer Pressure Ulcer Diabetic Wound/Ulcer of the Lower Primary Etiology: Extremity Cataracts, Lymphedema, Cataracts, Lymphedema, Cataracts, Lymphedema, Comorbid History: Hypertension, Peripheral Arterial Hypertension, Peripheral Arterial Hypertension, Peripheral Arterial Disease, Peripheral Venous Disease, Disease, Peripheral Venous Disease, Disease, Peripheral Venous Disease, Type II Diabetes, Osteoarthritis, Type II Diabetes, Osteoarthritis, Type II Diabetes, Osteoarthritis, Neuropathy Neuropathy Neuropathy 10/24/2022 11/28/2022 03/10/2023 Date Acquired: 44 39 25 Weeks of Treatment: Open Open Open Wound  Status: No No No Wound Recurrence: No No Yes Clustered Wound: 3x2x1 0.3x0.4x0.2 3.5x3.5x0.1 Measurements L x W x D (cm) 4.712 0.094 9.621 A (cm) : rea 4.712 0.019 0.962 Volume (cm) : -127.30% 97.30% -75.00% % Reduction in A rea: -2176.30% 94.50% -74.90% % Reduction in Volume: 3 Starting Position 1 (o'clock): 7 Ending Position 1 (o'clock): 1.7 Maximum Distance 1 (cm): Yes N/A N/A Undermining: Category/Stage IV Category/Stage III Grade 2 Classification: Medium Medium Large Exudate A mount: Serosanguineous Serosanguineous Serosanguineous Exudate Type: red, brown red, brown red, brown Exudate Color: Well defined, not attached Flat and Intact N/A Wound Margin: Large (67-100%) Medium (34-66%) Large (67-100%) Granulation A mount: Red Pink Red Granulation Quality: None Present (0%) Medium (34-66%) Small (1-33%) Necrotic A mount: N/A Eschar, Adherent Slough Adherent Slough Necrotic Tissue: Fascia: Yes Fat Layer (  SA, FEHRMAN ([id number]) [id number] Page 1 of 17 Visit Report for 09/04/2023 Arrival Information Details Patient Name: Date of Service: [facility], [name] 09/04/2023 3:30 PM Medical Record Number: [id number] Patient Account Number: [id number] Date of Birth/Sex: Treating RN: January 29, 1941 (82 y.o. [name] Primary Care Lashante Fryberger: Barbette Reichmann Other Clinician: Betha Loa Referring Marjory Meints: Treating Lori Liew/Extender: [name], [name], Vishwanath Weeks in Treatment: 49 Visit Information History Since Last Visit All ordered tests and consults were completed: No Patient Arrived: Wheel Chair Added or deleted any medications: No Arrival Time: 16:17 Any new allergies or adverse reactions: No Transfer Assistance: EasyPivot Patient Lift Had a fall or experienced change in No Patient Identification Verified: Yes activities of daily living that may affect Secondary Verification Process Completed: Yes risk of falls: Patient Requires Transmission-Based Precautions: No Signs or symptoms of abuse/neglect since last visito No Patient Has Alerts: Yes Hospitalized since last visit: No Patient Alerts: DM II Implantable device outside of the clinic excluding No ABI R 1.30 TBI 1.12 cellular tissue based products placed in the center ABI L 1.27 TBI 1.21 since last visit: Has Dressing in Place as Prescribed: Yes Pain Present Now: Yes Electronic Signature(s) Signed: 09/04/2023 5:46:43 PM By: Betha Loa Entered By: Betha Loa on 09/04/2023 13:18:30 -------------------------------------------------------------------------------- Clinic Level of Care Assessment Details Patient Name: Date of Service: [facility], [name] 09/04/2023 3:30 PM Medical Record Number: [id number] Patient Account Number: [id number] Date of Birth/Sex: Treating RN: 08/22/1941 (82 y.o. [name] Primary Care Himmat Enberg: Barbette Reichmann Other Clinician:  Betha Loa Referring Dana Debo: Treating Chyla Schlender/Extender: [name], [name], Vishwanath Weeks in Treatment: 49 Clinic Level of Care Assessment Items TOOL 1 Quantity Score []  - 0 Use when EandM and Procedure is performed on INITIAL visit ASSESSMENTS - Nursing Assessment / Reassessment []  - 0 General Physical Exam (combine w/ comprehensive assessment (listed just below) when performed on new pt. evals) []  - 0 Comprehensive Assessment (HX, ROS, Risk Assessments, Wounds Hx, etc.) Vohra, Mayra L ([id number]) [phone].pdf Page 2 of 17 ASSESSMENTS - Wound and Skin Assessment / Reassessment []  - 0 Dermatologic / Skin Assessment (not related to wound area) ASSESSMENTS - Ostomy and/or Continence Assessment and Care []  - 0 Incontinence Assessment and Management []  - 0 Ostomy Care Assessment and Management (repouching, etc.) PROCESS - Coordination of Care []  - 0 Simple Patient / Family Education for ongoing care []  - 0 Complex (extensive) Patient / Family Education for ongoing care []  - 0 Staff obtains Chiropractor, Records, T Results / Process Orders est []  - 0 Staff telephones HHA, Nursing Homes / Clarify orders / etc []  - 0 Routine Transfer to another Facility (non-emergent condition) []  - 0 Routine Hospital Admission (non-emergent condition) []  - 0 New Admissions / Manufacturing engineer / Ordering NPWT Apligraf, etc. , []  - 0 Emergency Hospital Admission (emergent condition) PROCESS - Special Needs []  - 0 Pediatric / Minor Patient Management []  - 0 Isolation Patient Management []  - 0 Hearing / Language / Visual special needs []  - 0 Assessment of Community assistance (transportation, D/C planning, etc.) []  - 0 Additional assistance / Altered mentation []  - 0 Support Surface(s) Assessment (bed, cushion, seat, etc.) INTERVENTIONS - Miscellaneous []  - 0 External ear exam []  - 0 Patient Transfer (multiple staff / Nurse, adult /  Similar devices) []  - 0 Simple Staple / Suture removal (25 or less) []  - 0 Complex Staple / Suture removal (26 or more) []  - 0  SA, FEHRMAN ([id number]) [id number] Page 1 of 17 Visit Report for 09/04/2023 Arrival Information Details Patient Name: Date of Service: [facility], [name] 09/04/2023 3:30 PM Medical Record Number: [id number] Patient Account Number: [id number] Date of Birth/Sex: Treating RN: January 29, 1941 (82 y.o. [name] Primary Care Lashante Fryberger: Barbette Reichmann Other Clinician: Betha Loa Referring Marjory Meints: Treating Lori Liew/Extender: [name], [name], Vishwanath Weeks in Treatment: 49 Visit Information History Since Last Visit All ordered tests and consults were completed: No Patient Arrived: Wheel Chair Added or deleted any medications: No Arrival Time: 16:17 Any new allergies or adverse reactions: No Transfer Assistance: EasyPivot Patient Lift Had a fall or experienced change in No Patient Identification Verified: Yes activities of daily living that may affect Secondary Verification Process Completed: Yes risk of falls: Patient Requires Transmission-Based Precautions: No Signs or symptoms of abuse/neglect since last visito No Patient Has Alerts: Yes Hospitalized since last visit: No Patient Alerts: DM II Implantable device outside of the clinic excluding No ABI R 1.30 TBI 1.12 cellular tissue based products placed in the center ABI L 1.27 TBI 1.21 since last visit: Has Dressing in Place as Prescribed: Yes Pain Present Now: Yes Electronic Signature(s) Signed: 09/04/2023 5:46:43 PM By: Betha Loa Entered By: Betha Loa on 09/04/2023 13:18:30 -------------------------------------------------------------------------------- Clinic Level of Care Assessment Details Patient Name: Date of Service: [facility], [name] 09/04/2023 3:30 PM Medical Record Number: [id number] Patient Account Number: [id number] Date of Birth/Sex: Treating RN: 08/22/1941 (82 y.o. [name] Primary Care Himmat Enberg: Barbette Reichmann Other Clinician:  Betha Loa Referring Dana Debo: Treating Chyla Schlender/Extender: [name], [name], Vishwanath Weeks in Treatment: 49 Clinic Level of Care Assessment Items TOOL 1 Quantity Score []  - 0 Use when EandM and Procedure is performed on INITIAL visit ASSESSMENTS - Nursing Assessment / Reassessment []  - 0 General Physical Exam (combine w/ comprehensive assessment (listed just below) when performed on new pt. evals) []  - 0 Comprehensive Assessment (HX, ROS, Risk Assessments, Wounds Hx, etc.) Vohra, Mayra L ([id number]) [phone].pdf Page 2 of 17 ASSESSMENTS - Wound and Skin Assessment / Reassessment []  - 0 Dermatologic / Skin Assessment (not related to wound area) ASSESSMENTS - Ostomy and/or Continence Assessment and Care []  - 0 Incontinence Assessment and Management []  - 0 Ostomy Care Assessment and Management (repouching, etc.) PROCESS - Coordination of Care []  - 0 Simple Patient / Family Education for ongoing care []  - 0 Complex (extensive) Patient / Family Education for ongoing care []  - 0 Staff obtains Chiropractor, Records, T Results / Process Orders est []  - 0 Staff telephones HHA, Nursing Homes / Clarify orders / etc []  - 0 Routine Transfer to another Facility (non-emergent condition) []  - 0 Routine Hospital Admission (non-emergent condition) []  - 0 New Admissions / Manufacturing engineer / Ordering NPWT Apligraf, etc. , []  - 0 Emergency Hospital Admission (emergent condition) PROCESS - Special Needs []  - 0 Pediatric / Minor Patient Management []  - 0 Isolation Patient Management []  - 0 Hearing / Language / Visual special needs []  - 0 Assessment of Community assistance (transportation, D/C planning, etc.) []  - 0 Additional assistance / Altered mentation []  - 0 Support Surface(s) Assessment (bed, cushion, seat, etc.) INTERVENTIONS - Miscellaneous []  - 0 External ear exam []  - 0 Patient Transfer (multiple staff / Nurse, adult /  Similar devices) []  - 0 Simple Staple / Suture removal (25 or less) []  - 0 Complex Staple / Suture removal (26 or more) []  - 0  Signed: 09/04/2023 5:46:43 PM By: Betha Loa Signed: 09/09/2023 8:00:25 AM By: Yevonne Pax RN Entered By: Betha Loa on 09/04/2023 14:21:16 -------------------------------------------------------------------------------- Pain Assessment Details Patient Name: Date of Service: [facility], [name] 09/04/2023 3:30 PM Medical Record Number: [id number] Patient Account Number: [id number] Date of Birth/Sex: Treating RN: [birth year] (82 y.o. [name] Primary Care Gao Mitnick: Barbette Reichmann Other Clinician: Betha Loa Referring Aryiana Klinkner: Treating Nina Mondor/Extender: [name], [name], Vishwanath Weeks in Treatment: 72 Active Problems Location of Pain Severity and Description of Pain Patient Has Paino Yes Site Locations Pain Location: Pain in Ulcers Duration of the Pain. Constant / Intermittento Constant Rate the pain. Current Pain Level: 5 Character of Pain Describe the Pain: Aching Pain Management and Medication Current Pain Management: Medication: Yes Cold Application: No Rest: No Massage: No Activity: No T.E.N.S.: No Heat Application: No Leg drop or elevation: No Is the Current Pain Management Adequate: Inadequate Sadler, Jenelle L ([id number]) [id number] Page 7 of 17 How does your wound impact your activities of daily livingo Sleep: No Bathing: No Appetite: No Relationship With Others: No Bladder Continence: No Emotions: No Bowel Continence: No Work: No Toileting: No Drive: No Dressing: No Hobbies: No Electronic Signature(s) Signed: 09/04/2023 5:46:43 PM By: Betha Loa Signed: 09/09/2023 8:00:25 AM By: Yevonne Pax RN Entered By: Betha Loa on 09/04/2023 13:18:55 -------------------------------------------------------------------------------- Patient/Caregiver Education Details Patient Name: Date of Service: [facility], [name] 10/23/2024andnbsp3:30 PM Medical Record Number: [id number] Patient Account Number: [id number] Date of Birth/Gender: Treating RN: [birth year] (82 y.o. [name] Primary Care Physician: Barbette Reichmann Other Clinician: Betha Loa Referring Physician: Treating Physician/Extender: [name], [name], Vishwanath Weeks in Treatment: 86 Education Assessment Education Provided To: Patient and Caregiver Education Topics Provided Wound/Skin Impairment: Handouts: Other: continue wound care as directed Methods: Explain/Verbal Responses: State  content correctly Electronic Signature(s) Signed: 09/04/2023 5:46:43 PM By: Betha Loa Entered By: Betha Loa on 09/04/2023 14:21:36 -------------------------------------------------------------------------------- Wound Assessment Details Patient Name: Date of Service: [facility], [name] 09/04/2023 3:30 PM Medical Record Number: [id number] Patient Account Number: [id number] Date of Birth/Sex: Treating RN: Jul 31, 1941 (82 y.o. [name] Primary Care Daiveon Markman: Barbette Reichmann Other Clinician: Betha Loa Referring [place] Marchiano: Treating Halah Whiteside/Extender: [address], MICHA EL Allegra Grana Minorca, Mariesha L ([id number]) [id number] Page 8 of 17 Weeks in Treatment: 49 Wound Status Wound Number: 11 Primary Pressure Ulcer Etiology: Wound Location: Right Trochanter Wound Open Wounding Event: Pressure Injury Status: Date Acquired: 10/24/2022 Comorbid Cataracts, Lymphedema, Hypertension, Peripheral Arterial Disease, Weeks Of Treatment: 44 History: Peripheral Venous Disease, Type II Diabetes, Osteoarthritis, Clustered Wound: No Neuropathy Photos Wound Measurements Length: (cm) 3 Width: (cm) 2 Depth: (cm) 1 Area: (cm) 4.712 Volume: (cm) 4.712 % Reduction in Area: -127.3% % Reduction in Volume: -2176.3% Epithelialization: None Undermining: Yes Starting Position (o'clock): 3 Ending Position (o'clock): 7 Maximum Distance: (cm) 1.7 Wound Description Classification: Category/Stage IV Wound Margin: Well defined, not attached Exudate Amount: Medium Exudate Type: Serosanguineous Exudate Color: red, brown Foul Odor After Cleansing: No Slough/Fibrino Yes Wound Bed Granulation Amount: Large (67-100%) Exposed Structure Granulation Quality: Red Fascia Exposed: Yes Necrotic Amount: None Present (0%) Fat Layer (Subcutaneous Tissue) Exposed: Yes Tendon Exposed: Yes Muscle Exposed: Yes Necrosis of Muscle: No Joint Exposed: No Bone  Exposed: Yes Treatment Notes Wound #11 (Trochanter) Wound Laterality: Right Cleanser Vashe 5.8 (oz) Discharge Instruction: damp to dry daily Peri-Wound Care Topical Primary Dressing Prisma 4.34 (in) Discharge Instruction: pack into tunnel and wound Secondary

## 2023-09-11 ENCOUNTER — Ambulatory Visit: Payer: PPO | Admitting: Internal Medicine

## 2023-09-16 NOTE — Progress Notes (Signed)
[name], [name] ([id number]) [id number] Page 1 of 18 Visit Report for 08/14/2023 Arrival Information Details Patient Name: Date of Service: [facility], [name]. 08/14/2023 1:00 PM Medical Record Number: [id number] Patient Account Number: [id number] Date of Birth/Sex: Treating RN: 06-02-1941 (82 y.o. [name] Primary Care Tenasia Aull: Barbette Reichmann Other Clinician: Betha [name] Referring Bryahna Lesko: Treating [name]/Extender: [name] Weeks in Treatment: 19 Visit Information History Since Last Visit All ordered tests and consults were completed: No Patient Arrived: Wheel Chair Added or deleted any medications: No Arrival Time: 13:02 Any new allergies or adverse reactions: No Transfer Assistance: EasyPivot Patient Lift Had a fall or experienced change in No Patient Identification Verified: Yes activities of daily living that may affect Secondary Verification Process Completed: Yes risk of falls: Patient Requires Transmission-Based Precautions: No Signs or symptoms of abuse/neglect since last visito No Patient Has Alerts: Yes Hospitalized since last visit: No Patient Alerts: DM II Implantable device outside of the clinic excluding No ABI R 1.30 TBI 1.12 cellular tissue based products placed in the center ABI [name] 1.27 TBI 1.21 since last visit: Has Dressing in Place as Prescribed: Yes Pain Present Now: Yes Electronic Signature(s) Signed: 08/20/2023 1:41:37 PM By: [name] Previous Signature: 08/15/2023 9:05:28 AM Version By: [name] Entered By: [name] on 08/20/2023 10:12:38 -------------------------------------------------------------------------------- Clinic Level of Care Assessment Details Patient Name: Date of Service: [facility], [name] 08/14/2023 1:00 PM Medical Record Number: [id number] Patient Account Number: [id number] Date of Birth/Sex: Treating RN: 12-19-1940 (82 y.o. [name] Primary Care Deandra Gadson: Barbette Reichmann Other Clinician: Betha [name] Referring Elain Wixon: Treating [name]/Extender: [name] Weeks in Treatment: 58 Clinic Level of Care Assessment Items TOOL 1 Quantity Score []  - 0 Use when EandM and Procedure is performed on INITIAL visit ASSESSMENTS - Nursing Assessment / Reassessment []  - 0 General Physical Exam (combine w/ comprehensive assessment (listed just below) when performed on new pt. evalsJAILINE, [name] [name] ([id number]) [id number] Page 2 of 18 []  - 0 Comprehensive Assessment (HX, ROS, Risk Assessments, Wounds Hx, etc.) ASSESSMENTS - Wound and Skin Assessment / Reassessment []  - 0 Dermatologic / Skin Assessment (not related to wound area) ASSESSMENTS - Ostomy and/or Continence Assessment and Care []  - 0 Incontinence Assessment and Management []  - 0 Ostomy Care Assessment and Management (repouching, etc.) PROCESS - Coordination of Care []  - 0 Simple Patient / Family Education for ongoing care []  - 0 Complex (extensive) Patient / Family Education for ongoing care []  - 0 Staff obtains Chiropractor, Records, T Results / Process Orders est []  - 0 Staff telephones HHA, Nursing Homes / Clarify orders / etc []  - 0 Routine Transfer to another Facility (non-emergent condition) []  - 0 Routine Hospital Admission (non-emergent condition) []  - 0 New Admissions / Manufacturing engineer / Ordering NPWT Apligraf, etc. , []  - 0 Emergency Hospital Admission (emergent condition) PROCESS - Special Needs []  - 0 Pediatric / Minor Patient Management []  - 0 Isolation Patient Management []  - 0 Hearing / Language / Visual special needs []  - 0 Assessment of Community assistance (transportation, D/C planning, etc.) []  - 0 Additional assistance / Altered mentation []  - 0 Support Surface(s) Assessment (bed, cushion, seat, etc.) INTERVENTIONS - Miscellaneous []  - 0 External ear  exam []  - 0 Patient Transfer (multiple staff / Nurse, adult / Similar devices) []  - 0 Simple Staple / Suture removal (25 or less) []  - 0 Complex Staple / Suture removal (26 or more) []  -  0 Hypo/Hyperglycemic Management (do not check if billed separately) []  - 0 Ankle / Brachial Index (ABI) - do not check if billed separately Has the patient been seen at the hospital within the last three years: Yes Total Score: 0 Level Of Care: ____ Electronic Signature(s) Signed: 08/20/2023 1:41:37 PM By: [name] Previous Signature: 08/15/2023 9:05:28 AM Version By: [name] Entered By: [name] on 08/20/2023 10:14:09 -------------------------------------------------------------------------------- Encounter Discharge Information Details Patient Name: Date of Service: [facility], [name] 08/14/2023 1:00 PM Medical Record Number: [id number] Patient Account Number: [id number] Date of Birth/Sex: Treating RN: Sep 10, 1941 (82 y.o. [name] Primary Care Ilisha Blust: Barbette Reichmann Other Clinician: Jordanne, [name] ([id number]) [id number] Page 3 of 18 Referring Jeily Guthridge: Treating Zariyah Stephens/Extender: [name], [name], [name] Weeks in Treatment: 72 Encounter Discharge Information Items Post Procedure Vitals Discharge Condition: Stable Temperature (F): 98.0 Ambulatory Status: Wheelchair Pulse (bpm): 85 Discharge Destination: Home Respiratory Rate (breaths/min): 18 Transportation: Private Auto Blood Pressure (mmHg): 94/57 Accompanied By: son Schedule Follow-up Appointment: Yes Clinical Summary of Care: Electronic Signature(s) Signed: 08/20/2023 1:41:37 PM By: [name] Entered By: [name] on 08/20/2023 10:16:41 -------------------------------------------------------------------------------- Lower Extremity Assessment Details Patient Name: Date of Service: [facility], [name] 08/14/2023 1:00 PM Medical  Record Number: [id number] Patient Account Number: [id number] Date of Birth/Sex: Treating RN: 1941/03/08 (82 y.o. [name] Primary Care Kinleigh Nault: Barbette Reichmann Other Clinician: Betha [name] Referring Tayshon Winker: Treating Archimedes Harold/Extender: [name] Weeks in Treatment: 46 Electronic Signature(s) Signed: 08/20/2023 1:41:37 PM By: [name] Signed: 08/20/2023 4:29:50 PM By: Angelina Pih Previous Signature: 08/15/2023 9:05:28 AM Version By: [name] Entered By: [name] on 08/20/2023 10:13:13 -------------------------------------------------------------------------------- Multi Wound Chart Details Patient Name: Date of Service: [id number], [name] 08/14/2023 1:00 PM Medical Record Number: [id number] Patient Account Number: [id number] Date of Birth/Sex: Treating RN: May 24, 1941 (82 y.o. [name] Primary Care Jeshawn Melucci: Barbette Reichmann Other Clinician: Betha [name] Referring Tremain Rucinski: Treating Quintell Bonnin/Extender: [name], [name], [name] Weeks in Treatment: 46 Vital Signs Height(in): Pulse(bpm): 85 Weight(lbs): 98 Blood Pressure(mmHg): 94/57 Body Mass Index(BMI): Temperature(F): 98.0 Respiratory Rate(breaths/min): 18 Sobieski, Yeira [name] ([id number]) [id number] Page 4 of 18 [11:Photos:] Right Trochanter Left Calcaneus Right, Distal, Medial Lower Leg Wound Location: Pressure Injury Pressure Injury Gradually Appeared Wounding Event: Pressure Ulcer Pressure Ulcer Diabetic Wound/Ulcer of the Lower Primary Etiology: Extremity Cataracts, Lymphedema, Cataracts, Lymphedema, Cataracts, Lymphedema, Comorbid History: Hypertension, Peripheral Arterial Hypertension, Peripheral Arterial Hypertension, Peripheral Arterial Disease, Peripheral Venous Disease, Disease, Peripheral Venous Disease, Disease, Peripheral Venous Disease, Type II Diabetes, Osteoarthritis, Type II Diabetes,  Osteoarthritis, Type II Diabetes, Osteoarthritis, Neuropathy Neuropathy Neuropathy 10/24/2022 11/28/2022 03/10/2023 Date A cquired: 41 36 22 Weeks of Treatment: Open Open Open Wound Status: No No No Wound Recurrence: No No Yes Clustered Wound: 3x2.4x1.2 0.4x1x0.2 8.2x3.5x0.2 Measurements [name] x W x D (cm) 5.655 0.314 22.541 A (cm) : rea 6.786 0.063 4.508 Volume (cm) : -172.80% 90.90% -310.00% % Reduction in A rea: -3178.30% 81.80% -719.60% % Reduction in Volume: 12 Starting Position 1 (o'clock): 12 Ending Position 1 (o'clock): 2.2 Maximum Distance 1 (cm): Yes [name]/A [name]/A Undermining: Category/Stage IV Category/Stage III Grade 2 Classification: Medium Medium Large Exudate A mount: Serosanguineous Serosanguineous Serosanguineous Exudate Type: red, brown red, brown red, brown Exudate Color: Well defined, not attached Flat and Intact [name]/A Wound Margin: Large (67-100%) Medium (34-66%) Large (67-100%) Granulation A mount: Red Pink Red Granulation Quality: None Present (0%) Medium (34-66%) Small (1-33%) Necrotic  A mount: [name]/A Eschar, Adherent Slough Adherent Slough Necrotic Tissue: Fascia: Yes Fat Layer (Subcutaneous Tissue): Yes Fat Layer (Subcutaneous Tissue): Yes Exposed Structures: Fat Layer (Subcutaneous Tissue): Yes Fascia: No Fascia: No Tendon: Yes Tendon: No Tendon: No Muscle: Yes Muscle: No Muscle: No Joint: No Joint: No Joint: No Bone: No Bone: No Bone: No None None None Epithelialization: [name]/A [name]/A [name]/A Debridement: [name]/A [name]/A [name]/A Tissue Debrided: [name]/A [name]/A [name]/A Level: [name]/A [name]/A [name]/A Debridement A (sq cm): rea [name]/A [name]/A [name]/A Instrument: [name]/A [name]/A [name]/A Bleeding: [name]/A [name]/A [name]/A Hemostasis A chieved: Debridement Treatment Response: [name]/A [name]/A [name]/A Post Debridement Measurements [name] x [name]/A [name]/A [name]/A W x D (cm) [name]/A [name]/A [name]/A Post Debridement Volume: (cm) [name]/A [name]/A [name]/A Post Debridement Stage: [name]/A [name]/A [name]/A Procedures Performed: Wound Number: [id number] 18  Photos: Left, Anterior  Lower Leg Left Trochanter Right Metatarsal head first Wound Location: Pressure Injury Pressure Injury Pressure Injury Wounding Event: Pressure Ulcer Pressure Ulcer Pressure Ulcer Primary Etiology: Cataracts, Lymphedema, Cataracts, Lymphedema, Cataracts, Lymphedema, Comorbid History: Hypertension, Peripheral Arterial Hypertension, Peripheral Arterial Hypertension, Peripheral Arterial Disease, Peripheral Venous Disease, Disease, Peripheral Venous Disease, Disease, Peripheral Venous Disease, Type II Diabetes, Osteoarthritis, Type II Diabetes, Osteoarthritis, Type II Diabetes, Osteoarthritis, Neuropathy Neuropathy Neuropathy [name], [name] ([id number]) [id number] Page 5 of 18 04/11/2023 05/22/2023 07/31/2023 Date Acquired: 17 12 2  Weeks of Treatment: Open Open Open Wound Status: No No No Wound Recurrence: No No No Clustered Wound: 0.1x0.1x0.1 1.9x1.9x0.9 1x0.3x0.2 Measurements [name] x W x D (cm) 0.008 2.835 0.236 A (cm) : rea 0.001 2.552 0.047 Volume (cm) : 100.00% 12.80% 68.40% % Reduction in A rea: 100.00% -685.20% 37.30% % Reduction in Volume: [name]/A [name]/A [name]/A Undermining: Category/Stage IV Unstageable/Unclassified Category/Stage II Classification: None Present Medium Medium Exudate A mount: [name]/A Serosanguineous Serosanguineous Exudate Type: [name]/A red, brown red, brown Exudate Color: [name]/A [name]/A [name]/A Wound Margin: None Present (0%) [name]/A Medium (34-66%) Granulation A mount: [name]/A [name]/A Red Granulation Quality: None Present (0%) [name]/A Medium (34-66%) Necrotic A mount: [name]/A Eschar, Adherent Slough Eschar, Adherent Slough Necrotic Tissue: Fat Layer (Subcutaneous Tissue): Yes Fat Layer (Subcutaneous Tissue): Yes Fat Layer (Subcutaneous Tissue): Yes Exposed Structures: Tendon: Yes Fascia: No Fascia: No Fascia: No Tendon: No Tendon: No Muscle: No Muscle: No Muscle: No Joint: No Joint: No Joint: No Bone: No Bone: No Bone: No Large (67-100%) [name]/A  None Epithelialization: [name]/A Debridement - Excisional [name]/A Debridement: Pre-procedure Verification/Time Out [name]/A 13:48 [name]/A Taken: [name]/A Subcutaneous [name]/A Tissue Debrided: [name]/A Skin/Subcutaneous Tissue [name]/A Level: [name]/A 2.83 [name]/A Debridement A (sq cm): rea [name]/A Curette [name]/A Instrument: [name]/A Minimum [name]/A Bleeding: [name]/A Pressure [name]/A Hemostasis A chieved: [name]/A Procedure was tolerated well [name]/A Debridement Treatment Response: [name]/A 1.9x1.9x1.2 [name]/A Post Debridement Measurements [name] x W x D (cm) [name]/A 3.402 [name]/A Post Debridement Volume: (cm) [name]/A Unstageable/Unclassified [name]/A Post Debridement Stage: [name]/A Debridement [name]/A Procedures Performed: Wound Number: 6 [name]/A [name]/A Photos: [name]/A [name]/A Right Calcaneus [name]/A [name]/A Wound Location: Pressure Injury [name]/A [name]/A Wounding Event: Pressure Ulcer [name]/A [name]/A Primary Etiology: Cataracts, Lymphedema, [name]/A [name]/A Comorbid History: Hypertension, Peripheral Arterial Disease, Peripheral Venous Disease, Type II Diabetes, Osteoarthritis, Neuropathy 07/23/2022 [name]/A [name]/A Date Acquired: 28 [name]/A [name]/A Weeks of Treatment: Open [name]/A [name]/A Wound Status: No [name]/A [name]/A Wound Recurrence: No [name]/A [name]/A Clustered Wound: 2x1.8x0.1 [name]/A [name]/A Measurements [name] x W x D (cm) 2.827 [name]/A [name]/A A (cm) : rea 0.283 [name]/A [name]/A Volume (cm) : 70.00% [name]/A [name]/A % Reduction in A rea: 90.00% [name]/A [name]/A % Reduction in Volume: [name]/A [name]/A [name]/A Undermining: Category/Stage  III [name]/A [name]/A Classification: Medium [name]/A [name]/A Exudate A mount: Serosanguineous [name]/A [name]/A Exudate Type: red, brown [name]/A [name]/A Exudate Color: Distinct, outline attached [name]/A [name]/A Wound Margin: Medium (34-66%) [name]/A [name]/A Granulation A mount: Red, Pink [name]/A [name]/A Granulation Quality: Medium (34-66%) [name]/A [name]/A Necrotic A mount: Eschar, Adherent Slough [name]/A [name]/A Necrotic Tissue: Fat Layer (Subcutaneous Tissue): Yes [name]/A [name]/A Exposed Structures: Fascia: No Tendon: No Vaughn, Shanavia [name] ([id number]) [id number] Page 6 of 18 Muscle: No Joint: No Bone:  No None [name]/A [name]/A Epithelialization: [name]/A [name]/A [name]/A Debridement: [name]/A [name]/A [name]/A Tissue Debrided: [name]/A [name]/A [name]/A Level: [name]/A [name]/A [name]/A Debridement A (sq cm): rea [name]/A [name]/A [name]/A Instrument: [name]/A [name]/A [name]/A Bleeding: [name]/A [name]/A [name]/A Hemostasis A chieved: Debridement Treatment Response: [name]/A [name]/A [name]/A Post Debridement Measurements [name] x [name]/A [name]/A [name]/A W x D (cm) [name]/A [name]/A [name]/A Post Debridement Volume: (cm) [name]/A [name]/A [name]/A Post Debridement Stage: [name]/A [name]/A [name]/A Procedures Performed: Treatment Notes Wound #11 (Trochanter) Wound Laterality: Right Cleanser Vashe 5.8 (oz) Discharge Instruction: damp to dry daily Peri-Wound Care Topical Primary Dressing Secondary Dressing (BORDER) Zetuvit Plus SILICONE BORDER Dressing 5x5 (in/in) Discharge Instruction: Please do not put silicone bordered dressings under wraps. Use non-bordered dressing only. Secured With Compression Wrap Compression Stockings Add-Ons Wound #13 (Calcaneus) Wound Laterality: Left Cleanser Vashe 5.8 (oz) Discharge Instruction: damp to dry daily Peri-Wound Care Topical Primary Dressing Secondary Dressing ABD Pad 5x9 (in/in) Discharge Instruction: Cover with ABD pad Kerlix 4.5 x 4.1 (in/yd) Discharge Instruction: Apply Kerlix 4.5 x 4.1 (in/yd) as instructed Secured With ACE WRAP - 12M ACE Elastic Bandage With VELCRO Brand Closure, 4 (in) Compression Wrap Compression Stockings Add-Ons Wound #14 (Lower Leg) Wound Laterality: Right, Medial, Distal Cleanser Vashe 5.8 (oz) Discharge Instruction: damp to dry daily Peri-Wound Care Topical [name], Osmara [name] ([id number]) [id number] Page 7 of 18 Primary Dressing Secondary Dressing ABD Pad 5x9 (in/in) Discharge Instruction: Cover with ABD pad Kerlix 4.5 x 4.1 (in/yd) Discharge Instruction: Apply Kerlix 4.5 x 4.1 (in/yd) as instructed Secured With ACE WRAP - 12M ACE Elastic Bandage With VELCRO Brand Closure, 4 (in) Compression Wrap Compression Stockings Add-Ons Wound  #15 (Lower Leg) Wound Laterality: Left, Anterior Cleanser Vashe 5.8 (oz) Discharge Instruction: damp to dry daily Peri-Wound Care Topical Primary Dressing Secondary Dressing ABD Pad 5x9 (in/in) Discharge Instruction: Cover with ABD pad Kerlix 4.5 x 4.1 (in/yd) Discharge Instruction: Apply Kerlix 4.5 x 4.1 (in/yd) as instructed Secured With ACE WRAP - 12M ACE Elastic Bandage With VELCRO Brand Closure, 4 (in) Compression Wrap Compression Stockings Add-Ons Wound #16 (Trochanter) Wound Laterality: Left Cleanser Normal Saline Discharge Instruction: Wash your hands with soap and water. Remove old dressing, discard into plastic bag and place into trash. Cleanse the wound with Normal Saline prior to applying a clean dressing using gauze sponges, not tissues or cotton balls. Do not scrub or use excessive force. Pat dry using gauze sponges, not tissue or cotton balls. Peri-Wound Care Topical Santyl Collagenase Ointment, 30 (gm), tube Discharge Instruction: apply nickel thick to wound bed only Silver-Sept Hydrogel, 1.5 (oz) Tube Discharge Instruction: soak gauze with Hydrogel and pack into wound like wet to dry dressing Primary Dressing Gauze Discharge Instruction: Vashe damp to dry over Santyl Secondary Dressing (BORDER) Zetuvit Plus SILICONE BORDER Dressing 4x4 (in/in) Discharge Instruction: Please do not put silicone bordered dressings under wraps. Use non-bordered dressing only. Secured With Compression Wrap Compression Stockings Add-Ons Wound #18 (Metatarsal head first) Wound Laterality: Right [name], [name] ([id number]) [id number] Page 8 of 18 Cleanser Normal Saline  Discharge Instruction: Wash your hands with soap and water. Remove old dressing, discard into plastic bag and place into trash. Cleanse the wound with Normal Saline prior to applying a clean dressing using gauze sponges, not tissues or cotton balls. Do not scrub or use excessive force.  Pat dry using gauze sponges, not tissue or cotton balls. Peri-Wound Care Topical Primary Dressing Gauze Discharge Instruction: Vashe damp to dry Secondary Dressing (BORDER) Zetuvit Plus SILICONE BORDER Dressing 4x4 (in/in) Discharge Instruction: Please do not put silicone bordered dressings under wraps. Use non-bordered dressing only. Secured With Compression Wrap Compression Stockings Add-Ons Wound #6 (Calcaneus) Wound Laterality: Right Cleanser Vashe 5.8 (oz) Discharge Instruction: damp to dry daily over Santyl Peri-Wound Care Topical Primary Dressing Secondary Dressing ABD Pad 5x9 (in/in) Discharge Instruction: Cover with ABD pad Kerlix 4.5 x 4.1 (in/yd) Discharge Instruction: Apply Kerlix 4.5 x 4.1 (in/yd) as instructed Secured With ACE WRAP - 31M ACE Elastic Bandage With VELCRO Brand Closure, 4 (in) Compression Wrap Compression Stockings Add-Ons Electronic Signature(s) Signed: 09/06/2023 2:00:56 PM By: Elliot Gurney, BSN, RN, CWS, Kim RN, BSN Previous Signature: 08/20/2023 1:41:37 PM Version By: [name] Previous Signature: 08/15/2023 9:05:28 AM Version By: [name] Entered By: Elliot Gurney, BSN, RN, CWS, Kim on 09/06/2023 11:00:56 -------------------------------------------------------------------------------- Multi-Disciplinary Care Plan Details Patient Name: Date of Service: [facility], [name] 08/14/2023 1:00 PM Medical Record Number: [id number] Patient Account Number: [id number] Date of Birth/Sex: Treating RN: 1940-12-19 (82 y.o. [name] [place], [place] [name] ([id number]) [id number] Page 9 of 18 Primary Care Zelia Yzaguirre: Barbette Reichmann Other Clinician: Betha [name] Referring Breeann Reposa: Treating Khalin Royce/Extender: [name], [name], [name] Weeks in Treatment: 46 Active Inactive Electronic Signature(s) Signed: 08/20/2023 1:41:37 PM By: [name] Signed: 08/20/2023 4:29:50 PM By: Angelina Pih Entered By:  [name] on 08/20/2023 10:14:31 -------------------------------------------------------------------------------- Pain Assessment Details Patient Name: Date of Service: [facility], [name] 08/14/2023 1:00 PM Medical Record Number: [id number] Patient Account Number: [id number] Date of Birth/Sex: Treating RN: October 12, 1941 (82 y.o. [name] Primary Care Brylea Pita: Barbette Reichmann Other Clinician: Betha [name] Referring Fletcher Ostermiller: Treating Shelba Susi/Extender: [name] Weeks in Treatment: 69 Active Problems Location of Pain Severity and Description of Pain Patient Has Paino Yes Site Locations Pain Location: Pain in Ulcers Duration of the Pain. Constant / Intermittento Constant Rate the pain. Current Pain Level: 7 Character of Pain Describe the Pain: Other: "I can't explain the pain" Pain Management and Medication Current Pain Management: Medication: Yes Cold Application: No Rest: No Massage: No Activity: No T.E.[name].S.: No Heat Application: No Leg drop or elevation: No Is the Current Pain Management Adequate: Inadequate How does your wound impact your activities of daily livingo Sleep: No Bathing: No Appetite: No Relationship With Others: No Bladder Continence: No Emotions: No Blissett, Tiffany [name] ([id number]) [id number] Page 10 of 18 Bowel Continence: No Work: No Toileting: No Drive: No Dressing: No Hobbies: No Electronic Signature(s) Signed: 08/20/2023 1:41:37 PM By: [name] Signed: 08/20/2023 4:29:50 PM By: Angelina Pih Previous Signature: 08/15/2023 9:05:28 AM Version By: [name] Entered By: [name] on 08/20/2023 10:12:55 -------------------------------------------------------------------------------- Patient/Caregiver Education Details Patient Name: Date of Service: [facility], [name] 10/2/2024andnbsp1:00 PM Medical Record Number: [id number] Patient Account Number:  [id number] Date of Birth/Gender: Treating RN: 10-13-1941 (82 y.o. [name] Primary Care Physician: Barbette Reichmann Other Clinician: Betha [name] Referring Physician: Treating Physician/Extender: [name], [name], [name] Weeks in Treatment: 33 Education Assessment Education Provided To: Patient and Caregiver Education  Topics Provided Wound/Skin Impairment: Handouts: Other: continue wound care as directed Methods: Explain/Verbal Responses: State content correctly Electronic Signature(s) Signed: 08/20/2023 1:41:37 PM By: [name] Entered By: [name] on 08/20/2023 10:15:21 -------------------------------------------------------------------------------- Wound Assessment Details Patient Name: Date of Service: [facility], [name] 08/14/2023 1:00 PM Medical Record Number: [id number] Patient Account Number: [id number] Date of Birth/Sex: Treating RN: 07-26-1941 (82 y.o. [name] Primary Care Samaira Holzworth: Barbette Reichmann Other Clinician: Betha [name] Referring Quin Mathenia: Treating Kelleen Stolze/Extender: [name] Weeks in Treatment: [address] Wound Status AULANI, SHIPTON [name] ([id number]) [id number] Page 11 of 18 Wound Number: 11 Primary Pressure Ulcer Etiology: Wound Location: Right Trochanter Wound Open Wounding Event: Pressure Injury Status: Date Acquired: 10/24/2022 Comorbid Cataracts, Lymphedema, Hypertension, Peripheral Arterial Disease, Weeks Of Treatment: 41 History: Peripheral Venous Disease, Type II Diabetes, Osteoarthritis, Clustered Wound: No Neuropathy Photos Wound Measurements Length: (cm) 3 Width: (cm) 2.4 Depth: (cm) 1.2 Area: (cm) 5.655 Volume: (cm) 6.786 % Reduction in Area: -172.8% % Reduction in Volume: -3178.3% Epithelialization: None Undermining: Yes Starting Position (o'clock): 12 Ending Position (o'clock): 12 Maximum Distance: (cm) 2.2 Wound  Description Classification: Category/Stage IV Wound Margin: Well defined, not attached Exudate Amount: Medium Exudate Type: Serosanguineous Exudate Color: red, brown Foul Odor After Cleansing: No Slough/Fibrino Yes Wound Bed Granulation Amount: Large (67-100%) Exposed Structure Granulation Quality: Red Fascia Exposed: Yes Necrotic Amount: None Present (0%) Fat Layer (Subcutaneous Tissue) Exposed: Yes Tendon Exposed: Yes Muscle Exposed: Yes Necrosis of Muscle: No Joint Exposed: No Bone Exposed: No Electronic Signature(s) Signed: 08/15/2023 9:05:28 AM By: [name] Signed: 09/16/2023 2:15:54 PM By: Elliot Gurney, BSN, RN, CWS, Kim RN, BSN Entered By: [name] on 08/14/2023 10:33:11 -------------------------------------------------------------------------------- Wound Assessment Details Patient Name: Date of Service: [facility], [name] 08/14/2023 1:00 PM Medical Record Number: [id number] Patient Account Number: [id number] Date of Birth/Sex: Treating RN: 28-Jan-1941 (82 y.o. [name] Primary Care Arien Benincasa: Barbette Reichmann Other Clinician: Betha [name] Referring Marcos Peloso: Treating Treyshon Buchanon/Extender: [name] [place], [name] ([id number]) [id number] Page 12 of 18 Weeks in Treatment: 46 Wound Status Wound Number: 13 Primary Pressure Ulcer Etiology: Wound Location: Left Calcaneus Wound Open Wounding Event: Pressure Injury Status: Date Acquired: 11/28/2022 Comorbid Cataracts, Lymphedema, Hypertension, Peripheral Arterial Disease, Weeks Of Treatment: 36 History: Peripheral Venous Disease, Type II Diabetes, Osteoarthritis, Clustered Wound: No Neuropathy Photos Wound Measurements Length: (cm) 0.4 Width: (cm) 1 Depth: (cm) 0.2 Area: (cm) 0.314 Volume: (cm) 0.063 % Reduction in Area: 90.9% % Reduction in Volume: 81.8% Epithelialization: None Wound Description Classification: Category/Stage III Wound Margin: Flat  and Intact Exudate Amount: Medium Exudate Type: Serosanguineous Exudate Color: red, brown Foul Odor After Cleansing: No Slough/Fibrino Yes Wound Bed Granulation Amount: Medium (34-66%) Exposed Structure Granulation Quality: Pink Fascia Exposed: No Necrotic Amount: Medium (34-66%) Fat Layer (Subcutaneous Tissue) Exposed: Yes Necrotic Quality: Eschar, Adherent Slough Tendon Exposed: No Muscle Exposed: No Joint Exposed: No Bone Exposed: No Electronic Signature(s) Signed: 08/15/2023 9:05:28 AM By: [name] Signed: 09/16/2023 2:15:54 PM By: Elliot Gurney, BSN, RN, CWS, Kim RN, BSN Entered By: [name] on 08/14/2023 10:33:57 -------------------------------------------------------------------------------- Wound Assessment Details Patient Name: Date of Service: [facility], [name] 08/14/2023 1:00 PM Medical Record Number: [id number] Patient Account Number: [id number] Date of Birth/Sex: Treating RN: 1941-05-18 (82 y.o. [name] Primary Care Keila Turan: Barbette Reichmann Other Clinician: Betha [name] Referring Athan Casalino: Treating Lella Mullany/Extender: [name] [place], [name] ([id number]) [id number] Page 13 of 18 Weeks in Treatment: 46 Wound Status Wound Number: 14 Primary Diabetic Wound/Ulcer  of the Lower Extremity Etiology: Wound Location: Right, Distal, Medial Lower Leg Wound Open Wounding Event: Gradually Appeared Status: Date Acquired: 03/10/2023 Comorbid Cataracts, Lymphedema, Hypertension, Peripheral Arterial Disease, Weeks Of Treatment: 22 History: Peripheral Venous Disease, Type II Diabetes, Osteoarthritis, Clustered Wound: Yes Neuropathy Photos Wound Measurements Length: (cm) 8.2 Width: (cm) 3.5 Depth: (cm) 0.2 Area: (cm) 22.541 Volume: (cm) 4.508 % Reduction in Area: -310% % Reduction in Volume: -719.6% Epithelialization: None Wound Description Classification: Grade 2 Exudate Amount: Large Exudate Type:  Serosanguineous Exudate Color: red, brown Foul Odor After Cleansing: No Slough/Fibrino Yes Wound Bed Granulation Amount: Large (67-100%) Exposed Structure Granulation Quality: Red Fascia Exposed: No Necrotic Amount: Small (1-33%) Fat Layer (Subcutaneous Tissue) Exposed: Yes Necrotic Quality: Adherent Slough Tendon Exposed: No Muscle Exposed: No Joint Exposed: No Bone Exposed: No Electronic Signature(s) Signed: 08/15/2023 9:05:28 AM By: [name] Signed: 09/16/2023 2:15:54 PM By: Elliot Gurney, BSN, RN, CWS, Kim RN, BSN Entered By: [name] on 08/14/2023 10:34:32 -------------------------------------------------------------------------------- Wound Assessment Details Patient Name: Date of Service: [facility], [name] 08/14/2023 1:00 PM Medical Record Number: [id number] Patient Account Number: [id number] Date of Birth/Sex: Treating RN: 1941/07/24 (82 y.o. [name] Primary Care Estalee Mccandlish: Barbette Reichmann Other Clinician: Betha [name] Referring Tiarra Anastacio: Treating Jazzmen Restivo/Extender: [name] Weeks in Treatment: [address], Tishanna [name] ([id number]) [id number] Page 14 of 18 Wound Status Wound Number: 15 Primary Pressure Ulcer Etiology: Wound Location: Left, Anterior Lower Leg Wound Open Wounding Event: Pressure Injury Status: Date Acquired: 04/11/2023 Comorbid Cataracts, Lymphedema, Hypertension, Peripheral Arterial Disease, Weeks Of Treatment: 17 History: Peripheral Venous Disease, Type II Diabetes, Osteoarthritis, Clustered Wound: No Neuropathy Photos Wound Measurements Length: (cm) 0.1 Width: (cm) 0.1 Depth: (cm) 0.1 Area: (cm) 0.008 Volume: (cm) 0.001 % Reduction in Area: 100% % Reduction in Volume: 100% Epithelialization: Large (67-100%) Wound Description Classification: Category/Stage IV Exudate Amount: None Present Foul Odor After Cleansing: No Slough/Fibrino No Wound Bed Granulation Amount: None  Present (0%) Exposed Structure Necrotic Amount: None Present (0%) Fascia Exposed: No Fat Layer (Subcutaneous Tissue) Exposed: Yes Tendon Exposed: Yes Muscle Exposed: No Joint Exposed: No Bone Exposed: No Electronic Signature(s) Signed: 08/15/2023 9:05:28 AM By: [name] Signed: 09/16/2023 2:15:54 PM By: Elliot Gurney, BSN, RN, CWS, Kim RN, BSN Entered By: [name] on 08/14/2023 10:35:09 -------------------------------------------------------------------------------- Wound Assessment Details Patient Name: Date of Service: [facility], [name] 08/14/2023 1:00 PM Medical Record Number: [id number] Patient Account Number: [id number] Date of Birth/Sex: Treating RN: 1940/12/21 (82 y.o. [name] Primary Care Nurah Petrides: Barbette Reichmann Other Clinician: Betha [name] Referring Jaydian Santana: Treating Kamrin Spath/Extender: [name] Weeks in Treatment: [address] Wound Status PHIL, CORTI [name] ([id number]) [id number] Page 15 of 18 Wound Number: 16 Primary Pressure Ulcer Etiology: Wound Location: Left Trochanter Wound Open Wounding Event: Pressure Injury Status: Date Acquired: 05/22/2023 Comorbid Cataracts, Lymphedema, Hypertension, Peripheral Arterial Disease, Weeks Of Treatment: 12 History: Peripheral Venous Disease, Type II Diabetes, Osteoarthritis, Clustered Wound: No Neuropathy Photos Wound Measurements Length: (cm) 1.9 Width: (cm) 1.9 Depth: (cm) 0.9 Area: (cm) 2.835 Volume: (cm) 2.552 % Reduction in Area: 12.8% % Reduction in Volume: -685.2% Wound Description Classification: Unstageable/Unclassified Exudate Amount: Medium Exudate Type: Serosanguineous Exudate Color: red, brown Foul Odor After Cleansing: No Slough/Fibrino Yes Wound Bed Necrotic Amount: Large (67-100%) Exposed Structure Necrotic Quality: Eschar, Adherent Slough Fascia Exposed: No Fat Layer (Subcutaneous Tissue) Exposed: Yes Tendon Exposed: No Muscle Exposed:  No Joint Exposed: No Bone Exposed: No Electronic Signature(s) Signed: 08/15/2023 9:05:28 AM By: [name] Signed: 09/16/2023 2:15:54 PM  By: Elliot Gurney, BSN, RN, CWS, Kim RN, BSN Entered By: [name] on 08/14/2023 10:35:49 -------------------------------------------------------------------------------- Wound Assessment Details Patient Name: Date of Service: [facility], [name] 08/14/2023 1:00 PM Medical Record Number: [id number] Patient Account Number: [id number] Date of Birth/Sex: Treating RN: 1941-04-02 (82 y.o. [name] Primary Care Rovena Hearld: Barbette Reichmann Other Clinician: Betha [name] Referring Denasia Venn: Treating Mistee Soliman/Extender: Karle Plumber, [name] Weeks in Treatment: 16 Wound Status Wound Number: 18 Primary Pressure Ulcer Etiology: LAINI, URICK [name] ([id number]) [phone].pdf Page 16 of 18 Etiology: Wound Location: Right Metatarsal head first Wound Open Wounding Event: Pressure Injury Status: Date Acquired: 07/31/2023 Comorbid Cataracts, Lymphedema, Hypertension, Peripheral Arterial Disease, Weeks Of Treatment: 2 History: Peripheral Venous Disease, Type II Diabetes, Osteoarthritis, Clustered Wound: No Neuropathy Photos Wound Measurements Length: (cm) 1 Width: (cm) 0.3 Depth: (cm) 0.2 Area: (cm) 0.236 Volume: (cm) 0.047 % Reduction in Area: 68.4% % Reduction in Volume: 37.3% Epithelialization: None Wound Description Classification: Category/Stage II Exudate Amount: Medium Exudate Type: Serosanguineous Exudate Color: red, brown Foul Odor After Cleansing: No Slough/Fibrino Yes Wound Bed Granulation Amount: Medium (34-66%) Exposed Structure Granulation Quality: Red Fascia Exposed: No Necrotic Amount: Medium (34-66%) Fat Layer (Subcutaneous Tissue) Exposed: Yes Necrotic Quality: Eschar, Adherent Slough Tendon Exposed: No Muscle Exposed: No Joint Exposed: No Bone Exposed: No Electronic  Signature(s) Signed: 08/15/2023 9:05:28 AM By: [name] Signed: 09/16/2023 2:15:54 PM By: Elliot Gurney, BSN, RN, CWS, Kim RN, BSN Entered By: [name] on 08/14/2023 10:36:28 -------------------------------------------------------------------------------- Wound Assessment Details Patient Name: Date of Service: [facility], [name] 08/14/2023 1:00 PM Medical Record Number: [id number] Patient Account Number: [id number] Date of Birth/Sex: Treating RN: 1941/05/21 (82 y.o. [name] Primary Care Kaiyana Bedore: Barbette Reichmann Other Clinician: Betha [name] Referring Acheron Sugg: Treating Sriansh Farra/Extender: Karle Plumber, [name] Weeks in Treatment: 57 Wound Status Wound Number: 6 Primary Pressure Ulcer Etiology: Wound Location: Right Calcaneus Sciulli, Dicie [name] ([id number]) [id number] Page 17 of 18 Wound Open Wounding Event: Pressure Injury Status: Date Acquired: 07/23/2022 Comorbid Cataracts, Lymphedema, Hypertension, Peripheral Arterial Disease, Weeks Of Treatment: 46 History: Peripheral Venous Disease, Type II Diabetes, Osteoarthritis, Clustered Wound: No Neuropathy Photos Wound Measurements Length: (cm) 2 Width: (cm) 1.8 Depth: (cm) 0.1 Area: (cm) 2.827 Volume: (cm) 0.283 % Reduction in Area: 70% % Reduction in Volume: 90% Epithelialization: None Wound Description Classification: Category/Stage III Wound Margin: Distinct, outline attached Exudate Amount: Medium Exudate Type: Serosanguineous Exudate Color: red, brown Foul Odor After Cleansing: No Slough/Fibrino Yes Wound Bed Granulation Amount: Medium (34-66%) Exposed Structure Granulation Quality: Red, Pink Fascia Exposed: No Necrotic Amount: Medium (34-66%) Fat Layer (Subcutaneous Tissue) Exposed: Yes Necrotic Quality: Eschar, Adherent Slough Tendon Exposed: No Muscle Exposed: No Joint Exposed: No Bone Exposed: No Electronic Signature(s) Signed: 08/15/2023 9:05:28 AM By:  [name] Signed: 09/16/2023 2:15:54 PM By: Elliot Gurney, BSN, RN, CWS, Kim RN, BSN Entered By: [name] on 08/14/2023 10:36:59 -------------------------------------------------------------------------------- Vitals Details Patient Name: Date of Service: [facility], [name] 08/14/2023 1:00 PM Medical Record Number: [id number] Patient Account Number: [id number] Date of Birth/Sex: Treating RN: 04-Oct-1941 (82 y.o. [name] Primary Care Tennis Mckinnon: Barbette Reichmann Other Clinician: Betha [name] Referring Haidyn Chadderdon: Treating Casyn Becvar/Extender: [name] Weeks in Treatment: 18 Vital Signs Time Taken: 13:11 Temperature (F): 98.0 Macnair, Vennie [name] ([id number]) [id number] Page 18 of 18 Weight (lbs): 98 Pulse (bpm): 85 Respiratory Rate (breaths/min): 18 Blood Pressure (mmHg): 94/57 Reference Range: 80 - 120 mg / dl Electronic Signature(s) Signed: 08/20/2023 1:41:37 PM By: [name] Previous Signature: 08/15/2023 9:05:28  AM Version By: [name] Entered By: [name] on 08/20/2023 10:12:46

## 2023-09-16 NOTE — Progress Notes (Signed)
[name], [name] ([id number]) [id number] Page 1 of 12 Visit Report for 08/14/2023 Chief Complaint Document Details Patient Name: Date of Service: [facility], [place] [name]. 08/14/2023 1:00 PM Medical Record Number: [id number] Patient Account Number: [id number] Date of Birth/Sex: Treating RN: [birth year] (82 y.o. [name] Primary Care Provider: Barbette Reichmann Other Clinician: Betha Loa Referring Provider: Treating Provider/Extender: [name], [name], Vishwanath Weeks in Treatment: 46 Information Obtained from: Patient Chief Complaint Right heel wound, right hip wound, right ankle wound, left ankle wound, right lower extremity wound, left anterior lower extremity wound, left hip wound Electronic Signature(s) Signed: 09/06/2023 1:58:34 PM By: Elliot Gurney, BSN, RN, CWS, Kim RN, BSN Signed: 09/08/2023 11:21:49 AM By: Baltazar Najjar MD Previous Signature: 09/06/2023 1:57:59 PM Version By: Elliot Gurney, BSN, RN, CWS, Kim RN, BSN Entered By: Elliot Gurney, BSN, RN, CWS, Kim on 09/06/2023 13:58:34 -------------------------------------------------------------------------------- Debridement Details Patient Name: Date of Service: [facility], DIA [name]. 08/14/2023 1:00 PM Medical Record Number: [id number] Patient Account Number: [id number] Date of Birth/Sex: Treating RN: Jun 25, 1941 (82 y.o. [name] Primary Care Provider: Barbette Reichmann Other Clinician: Betha Loa Referring Provider: Treating Provider/Extender: [name], [name], Vishwanath Weeks in Treatment: 46 Debridement Performed for Assessment: Wound #16 Left Trochanter Performed By: Physician Maxwell Caul, MD Debridement Type: Debridement Level of Consciousness (Pre-procedure): Awake and Alert Pre-procedure Verification/Time Out Yes - 13:48 Taken: Start Time: 13:48 Percent of Wound Bed Debrided: 100% T Area Debrided (cm): otal 2.83 Tissue and other material debrided: Viable,  Non-Viable, Subcutaneous Level: Skin/Subcutaneous Tissue Debridement Description: Excisional Instrument: Curette Bleeding: Minimum Hemostasis Achieved: Pressure Response to Treatment: Procedure was tolerated well Level of Consciousness (Post- Awake and Alert [name], [name] ([id number]) [id number] Page 2 of 12 Awake and Alert procedure): Post Debridement Measurements of Total Wound Length: (cm) 1.9 Stage: Unstageable/Unclassified Width: (cm) 1.9 Depth: (cm) 1.2 Volume: (cm) 3.402 Character of Wound/Ulcer Post Debridement: Stable Post Procedure Diagnosis Same as Pre-procedure Electronic Signature(s) Signed: 09/06/2023 1:59:36 PM By: Elliot Gurney, BSN, RN, CWS, Kim RN, BSN Signed: 09/08/2023 11:21:49 AM By: Baltazar Najjar MD Previous Signature: 08/15/2023 9:05:28 AM Version By: Betha Loa Previous Signature: 08/16/2023 2:08:40 PM Version By: Allen Derry PA-C Entered By: Elliot Gurney BSN, RN, CWS, Kim on 09/06/2023 13:59:36 -------------------------------------------------------------------------------- HPI Details Patient Name: Date of Service: [facility], DIA [name]. 08/14/2023 1:00 PM Medical Record Number: [id number] Patient Account Number: [id number] Date of Birth/Sex: Treating RN: 1941-01-22 (82 y.o. [name] Primary Care Provider: Barbette Reichmann Other Clinician: Betha Loa Referring Provider: Treating Provider/Extender: [name], [name], Vishwanath Weeks in Treatment: 46 History of Present Illness HPI Description: 82 year old patient who comes with a referral for bilateral lower extremity edema and a lower extremity ulceration and has been sent by her PCP [name]. I understand the patient was recently put on amoxicillin and doxycycline but could not tolerate the amoxicillin. doxycycline course was completed. a BNP and EKG was supposed to be normal and the patient did not have any dyspnea. the patient has been on a  diuretic. The patient was also prescribed a pair of elastic compression stockings of the 20-30 mmHg pressure variety. x-ray of the right ankle was done on 09/20/2015 and showed posttraumatic and postsurgical changes of the right ankle with secondary degenerative changes of the tibiotalar joint and to a lesser degree the subtalar joint. No definite acute bony abnormalities are noted. Past medical history significant for diabetes mellitus, hypertension, hyperlipidemia, right breast cancer treated  with a mastectomy in 2014. She has never smoked. 10/24/2015 -- she had delayed her vascular test because of her husband surgery but she is now ready to get him taken care of. He is also unable to use compression stockings and hence we will need to order her Juzo wraps. 10/31/2015-- was seen by Dr. Wyn Quaker on 10/28/2015. She had a left lower extremity arterial duplex done at his office a couple of years ago and that was essentially normal. Today they performed a venous duplex which revealed no evidence of deep vein thrombosis, superficial thrombophlebitis, no venous reflex was seen on the right and a minimal amount of reflux was seen on the left great saphenous vein but no significant reflux was seen. Impression was that there was a component of lymphedema present from a previous surgery and he would recommend compression stockings and leg elevation. 10/2; is a patient we are following every 2 weeks. Apparently seen by Dr. Joylene Draft since she was last here no a day additional antibiotics were added. There was some suggestion about US obtaining cultures and we could bring them to her attention. The patient has stage IV wound over the right greater trochanter and unstageable area on the left but it is probably stage IV as well. She has a small area on the tip of her left heel larger wounds on the left [name] left medial foot, left medial ankle Readmission: 07-24-2022 upon evaluation today patient presents for initial  inspection here in our clinic concerning issues that she has been having with her legs this is actually been going on for several years according to what her family member with her today tells me as well as what the patient reiterates as well. She is currently most recently been seeing Dr. Ether Griffins and subsequently he had her in [place] boots. However 2 weeks ago he referred her to [place] and then subsequently took her out of the [place] boot wraps at that point. At this time the left leg looks to be worse in the right leg currently. She is on Lasix and lisinopril with hydrochlorothiazide she has high blood pressure she also has issues currently with lower extremity swelling and edema which has been an ongoing issue for her as well. Patient does have a history of chronic venous hypertension, lymphedema, diabetes mellitus type 2, hypertension, peripheral vascular disease, and neuropathy. Currently she is on Lasix as well as lisinopril with hydrochlorothiazide. 08-02-2022 upon evaluation today patient presents for reevaluation the good news is she is actually doing somewhat better in regard to the wound and the [facility], Brynlei [name] ([id number]) [id number] Page 3 of 12 overall appearance and sinuses. The unfortunate thing is her infection really is not significantly improved we did have to switch out her antibiotic once we got that final result back and I switched her to Levaquin and away from the doxycycline. Unfortunately the doxycycline had been doing poorly for her. In fact she had had diarrhea from the time she started taking it on Friday and she is still having it when she shows up today for evaluation. Again I was not aware of this and obviously she does appear to be somewhat dehydrated as well based on what I see. My concern which I discussed with the patient today is the possibility of a C. difficile infection. With that being said fact this started immediately upon taking the  doxycycline makes me think that it was just the medicine and is not completely out of her system yet despite having taken  the last dose Tuesday morning. Nonetheless with what we are seeing currently I want to be sure that reason I Minna contact her primary care provider and see if a would be willing to see her and test for C. difficile infection. 08-07-2022 upon evaluation today patient appears to be doing well currently in regard to her wounds which are actually measuring much better this is great news. Fortunately I do not see any signs of active infection locally or systemically at this time which is great as well. The good news is she was tested for a C. difficile infection and it was negative. I am very pleased and thankful for primary care provider for doing that so this means that she was just having a severe reaction to the doxycycline we have added that to her allergy list at this point. 08-14-2022 upon evaluation today patient appears to be doing well currently in regard to her dehydration she is actually significantly improved compared to last time I saw her last week. With that being said I do not see any evidence of active infection systemically at this time which is great news. However locally she still does appear to have cellulitis in left lower extremity. I discussed with her today that I do believe she would benefit from going ahead and starting the Levaquin. Again we will concerned about the C. difficile infection of the diarrhea but that this turned out to be just an issue with a reaction to the doxycycline. My hope is that the Levaquin will not cause her any complication and will be able to treat the infection. I did review her arterial study as well which was dated on 07-31-2022. It showed that she had a ABI on the right of 1.30 and on the left of 1.27 with a TBI on the right of 1.12 and the left 1.21 this is a normal arterial study. Again on the patient's wound culture she actually  did show evidence of Proteus, Morganella, and MRSA. Levaquin is a good option here across the board. 09/26/2022 Ms. [name] Dorwart is a 82 year old female with a past medical history of type 2 diabetes currently controlled on oral agents, venous insufficiency/lymphedema, right breast cancer and chronic diastolic heart failure that presents to the clinic for a 1 month history of nonhealing wounds to the left lower extremity, right heel and left buttocks. She states that the buttocks wound and the right heel wound developed while she was in the hospital. She was admitted on 08/22/2022 for severe sepsis secondary to acute sigmoid and distal colonic diverticulitis. At that time it was noted she had a stage I decubitus ulcer to her bilateral buttocks. A wound to the heel was not mentioned. She currently denies systemic signs of infection. She came into clinic in a blue gown with no undergarments. She has not been dressing the wounds. She states she just recently obtained home health and they are coming out for the first time this week. She is seen in our clinic often for lower extremity wounds secondary to venous insufficiency. 11/22; patient presents for follow-up. Son is present during the encounter. Per son it sounds like they are not doing any dressing changes. She states she has home health but they have not come out. She is going to let us know which home health agency she has been approved for so we can send orders. Currently she denies signs of infection. 12/13; patient presents for follow-up. Patient has home health and they are coming out once a  week. It appears that there has not been any dressing changes except for with home health. Patient has a newly discovered wound to the left foot. There is exposed bone. There is slight erythema and increased warmth to the surrounding tissue. Patient is completely unaware of this wound. 12/20; patient presents for follow-up. She has been taking her oral  antibiotics. She now has an eschar and wound to the right hip. She has been using Dakin's wet-to-dry dressings to the left foot wound and buttocks wound. She has been using Medihoney and silver alginate to the right heel wound. She currently denies systemic signs of infection. She is mainly bedbound or in a wheelchair. 1/10; patient presents for follow-up. Patient had her left second toenail removed by Dr. Ether Griffins, podiatry on 12/21. Unfortunately she did not feel well and she was admitted to the hospital for sepsis. Her right heel wound was thought to be infected and this was debrided in the OR by Dr. Ether Griffins. Culture and bone biopsy had no growth. She has been using Medihoney to all the wound beds except for the lefty buttocks wound for which she uses Dakin's wet-to-dry dressings. She has developed a pressure injury to the left heel now. This is despite having Prevalon boots. Although she is not wearing them today. She has completed 4 weeks of Augmentin and also a week of doxycycline for osteomyelitis of the left foot. Her left foot wound no longer probes to bone. Currently she denies signs of infection. 1/24; patient presents for follow-up. She has been using Medihoney and Hydrofera Blue to the wound beds except for this buttocks wound she has been using Dakin's wet-to-dry dressings. She has developed 2 new wounds 1 to the left heel and the other to the right medial ankle. She claims she is wearing her Prevalon boots all the time although she does not have them on today. She currently denies signs of infection. 2/21; patient presents for follow-up. She was recently hospitalized on 12/19/2022 for sepsis secondary to Enterococcus bacillus bacteremia. She was treated with IV antibiotics and has completed her discharge oral antibiotics. She had a CT scanning of the abdomen/pelvis without acute abnormalities. Husband is present with patient today. They have been using Dakin's wet-to-dry dressings to the  sacral wound and hip wound and Hydrofera Blue and Medihoney to the feet wounds. They are not changing the dressings daily. It is unclear how often they actually change the dressings. She has been wearing her Prevalon boots at night but not during the day. She currently denies systemic signs of infection. 3/20; patient presents for follow-up. She again was in the hospital for severe sepsis on 2/24; MRIs at that time showed findings suspicious for osteomyelitis to the right hip. She is currently on Augmentin to complete 4 weeks of this. She is following with ID. She has been using Dakin's wet-to-dry dressings to the sacrum and right hip however the solution is starting to cause discomfort. T the rest of the wound she has been using Hydrofera Blue and Medihoney. Patient o and son are not interested in doing palliative care or hospice. They state that they had discussions while in the hospital. 4/17; patient presents for follow-up. She has been using Vashe wet-to-dry dressings to the sacrum and right trochanter wound. She has been using Medihoney and Hydrofera Blue to the bilateral feet wounds. She has no issues or complaints today. She denies signs of infection. 5/1; patient developed a new wound to the right leg. Son was present and states  that there was a scab and he removed it creating a wound. He has been keeping the area covered. T the sacral and right trochanter wound they have been using Vashe wet-to-dry dressings and Medihoney and Hydrofera Blue to the o bilateral feet wounds. She denies signs of infection. 6/1; 1 month follow-up. She has a new area on the left anterior lower leg. In the meantime the sacral ulcer is closed. She still has an extensive wound on the right hip with exposed tendon over the greater trochanter. On the lower extremities the left anterior lower leg wound is new right ankle left heel and right heel all look reasonably stable. We are using Vashe wet-to-dry on the hip  Medihoney and Hydrofera Blue on the lower extremities. She is accompanied by her son I think who is doing the dressings. They do not have a pressure-relief surface at home but they do have a hospital bed 7/10; I have not seen this patient since 03/13/2023. She was scheduled to be seen on 5/29 however missed her appointment due to illness. She was seen on 6/5 by Dr. Leanord Hawking and she presents today for her follow-up. Unfortunately her wounds have declined since I last saw her. She has several new wounds including one to the left hip wound and one left anterior lower leg. The right hip wound has declined and has maggots with bone exposed. She has been using Vashe wet- to-dry dressings to Right hip wound and Medihoney and Hydrofera Blue to the The remaining wounds. Sacral wound remains closed. 7/24; patient presents for follow-up. At last clinic visit I recommended she go to the ED for potential admission, IV antibiotics and further imaging. She had MRI of the pelvis and left foot that showed osteomyelitis. Podiatry advised conservative management. She was also seen by infectious disease who discharged patient on 2 weeks of linezolid and 2 weeks of ciprofloxacin. Patient is at home and has home health. Palliative care was consulted during the stay and she is full code and has declined further outpatient palliative care and hospice services.. She currently denies systemic signs of infection. Overall there is improvement in the appearance of the wounds. There is healthier granulation tissue present. However this is overall poor prognosis case. She has Prevalon boots but it is unclear if she is using these. She does not have them on today. She follows up with infectious disease next week. 8/7; Patient's been using Vashe wet-to-dry dressing to all the wound beds except for the left hip she has been using Santyl. She followed up with infectious [name], [name] ([id number]) [phone].pdf  Page 4 of 12 disease on 8/1. Currently she is on linezolid and ciprofloxacin. They recommended lab work to see if she needs extension of her oral antibiotics. She currently denies systemic signs of infection. 8/21; patient presents for follow-up. She has been using Vashe wet-to-dry dressings to all wounds except the left hip she has been using Santyl. All wounds have improved in size and appearance. She has healthy granulation tissue present to all wounds except the left hip still has nonviable tissue throughout. She states she has completed her oral antibiotics per ID. She currently denies systemic signs of infection. 9/4; patient presents for follow-up. She has been using Vashe wet-to-dry dressings to all wounds except the left hip. T the left hip she has been using Santyl. o All wounds appear well-healing. She currently denies systemic signs of infection. 9/18; 2-week follow-up. This is a very disabled woman who is nonambulatory. She has  bilateral hip and knee flexion contractures which are fairly severe. She has significant bilateral lower extremity pressure ulcers bilaterally. We have been using Vashe wet-to-dry to most of these except Santyl in the left hip wound Electronic Signature(s) Signed: 09/06/2023 1:58:43 PM By: Elliot Gurney, BSN, RN, CWS, Kim RN, BSN Signed: 09/08/2023 11:21:49 AM By: Baltazar Najjar MD Previous Signature: 08/14/2023 3:47:50 PM Version By: Baltazar Najjar MD Entered By: Elliot Gurney, BSN, RN, CWS, Kim on 09/06/2023 13:58:42 -------------------------------------------------------------------------------- Physical Exam Details Patient Name: Date of Service: [facility], DIA [name]. 08/14/2023 1:00 PM Medical Record Number: [id number] Patient Account Number: [id number] Date of Birth/Sex: Treating RN: 1941/06/03 (82 y.o. [name] Primary Care Provider: Barbette Reichmann Other Clinician: Betha Loa Referring Provider: Treating Provider/Extender: [name], MICHA EL  G Hande, Vishwanath Weeks in Treatment: 89 Constitutional Patient is hypotensive.However appears well. Pulse regular and within target range for patient.Marland Kitchen Respirations regular, non-labored and within target range.. Temperature is normal and within the target range for the patient.Marland Kitchen appears in no distress. Notes Wound exam; the patient has multiple wounds including substantial areas over the greater trochanters bilaterally. The right is clean but with exposed bone. On the left and extensive debridement through the fat subcutaneous tissue. I am still not down to what I would consider viable however no exposed bone either. She has an area on the distal left leg and the left heel. We have been using Vashe wet-to-dry here might be time to change to a different dressing soon She has wounds on the right medial first metatarsal head right calcaneus and right medial ankle. All of these look clean Electronic Signature(s) Signed: 09/06/2023 1:58:52 PM By: Elliot Gurney, BSN, RN, CWS, Kim RN, BSN Signed: 09/08/2023 11:21:49 AM By: Baltazar Najjar MD Previous Signature: 08/14/2023 3:47:50 PM Version By: Baltazar Najjar MD Entered By: Elliot Gurney BSN, RN, CWS, Kim on 09/06/2023 13:58:51 -------------------------------------------------------------------------------- Physician Orders Details Patient Name: Date of Service: [facility], DIA [name]. 08/14/2023 1:00 PM Medical Record Number: [id number] Patient Account Number: [id number] Date of Birth/Sex: Treating RN: 08/27/1941 (82 y.o. [name] Primary Care Provider: Barbette Reichmann Other Clinician: Sarahelizabeth, [place] ([id number]) [id number] Page 5 of 12 Referring Provider: Treating Provider/Extender: [name], [name], Vishwanath Weeks in Treatment: 31 Verbal / Phone Orders: Yes Clinician: Angelina Pih Read Back and Verified: Yes Diagnosis Coding Follow-up Appointments Return Appointment in 2  weeks. Home Health Home Health Company: - Harlin Rain, RN [phone] [facility] for wound care. May utilize formulary equivalent dressing for wound treatment orders unless otherwise specified. Home Health Nurse may visit PRN to address patients wound care needs. - left trochanter, santyl daily all other wounds- vashe damp tp dry daily   Off-LoadingLow air-loss mattress (Group 2) Wound Treatment Wound #11 - Trochanter Wound Laterality: Right Cleanser: Vashe 5.8 (oz) (Home Health) 1 x Per Day/30 Days Discharge Instructions: damp to dry daily Secondary Dressing: (BORDER) Zetuvit Plus SILICONE BORDER Dressing 5x5 (in/in) 1 x Per Day/30 Days Discharge Instructions: Please do not put silicone bordered dressings under wraps. Use non-bordered dressing only. Wound #13 - Calcaneus Wound Laterality: Left Cleanser: Vashe 5.8 (oz) (Home Health) 1 x Per Day/30 Days Discharge Instructions: damp to dry daily Secondary Dressing: ABD Pad 5x9 (in/in) 1 x Per Day/30 Days Discharge Instructions: Cover with ABD pad Secondary Dressing: Kerlix 4.5 x 4.1 (in/yd) 1 x Per Day/30 Days Discharge Instructions: Apply Kerlix 4.5 x 4.1 (in/yd) as instructed Secured With: ACE WRAP - 58M ACE Elastic  Bandage With VELCRO Brand Closure, 4 (in) 1 x Per Day/30 Days Wound #14 - Lower Leg Wound Laterality: Right, Medial, Distal Cleanser: Vashe 5.8 (oz) (Home Health) 1 x Per Day/30 Days Discharge Instructions: damp to dry daily Secondary Dressing: ABD Pad 5x9 (in/in) 1 x Per Day/30 Days Discharge Instructions: Cover with ABD pad Secondary Dressing: Kerlix 4.5 x 4.1 (in/yd) 1 x Per Day/30 Days Discharge Instructions: Apply Kerlix 4.5 x 4.1 (in/yd) as instructed Secured With: ACE WRAP - 56M ACE Elastic Bandage With VELCRO Brand Closure, 4 (in) 1 x Per Day/30 Days Wound #15 - Lower Leg Wound Laterality: Left, Anterior Cleanser: Vashe 5.8 (oz) (Home Health) 1 x Per Day/30 Days Discharge Instructions: damp to dry  daily Secondary Dressing: ABD Pad 5x9 (in/in) 1 x Per Day/30 Days Discharge Instructions: Cover with ABD pad Secondary Dressing: Kerlix 4.5 x 4.1 (in/yd) 1 x Per Day/30 Days Discharge Instructions: Apply Kerlix 4.5 x 4.1 (in/yd) as instructed Secured With: ACE WRAP - 56M ACE Elastic Bandage With VELCRO Brand Closure, 4 (in) 1 x Per Day/30 Days Wound #16 - Trochanter Wound Laterality: Left Cleanser: Normal Saline (Home Health) 1 x Per Day/30 Days Discharge Instructions: Wash your hands with soap and water. Remove old dressing, discard into plastic bag and place into trash. Cleanse the wound with Normal Saline prior to applying a clean dressing using gauze sponges, not tissues or cotton balls. Do not scrub or use excessive force. Pat dry using gauze sponges, not tissue or cotton balls. Topical: Santyl Collagenase Ointment, 30 (gm), tube (Home Health) 1 x Per Day/30 Days Discharge Instructions: apply nickel thick to wound bed only Topical: Silver-Sept Hydrogel, 1.5 (oz) Tube 1 x Per Day/30 Days Discharge Instructions: soak gauze with Hydrogel and pack into wound like wet to dry dressing [name], [name] ([id number]) [id number] Page 6 of 12 Prim Dressing: Gauze 1 x Per Day/30 Days ary Discharge Instructions: Vashe damp to dry over Santyl Secondary Dressing: (BORDER) Zetuvit Plus SILICONE BORDER Dressing 4x4 (in/in) (Home Health) 1 x Per Day/30 Days Discharge Instructions: Please do not put silicone bordered dressings under wraps. Use non-bordered dressing only. Wound #18 - Metatarsal head first Wound Laterality: Right Cleanser: Normal Saline (Home Health) 1 x Per Day/30 Days Discharge Instructions: Wash your hands with soap and water. Remove old dressing, discard into plastic bag and place into trash. Cleanse the wound with Normal Saline prior to applying a clean dressing using gauze sponges, not tissues or cotton balls. Do not scrub or use excessive force. Pat dry  using gauze sponges, not tissue or cotton balls. Prim Dressing: Gauze 1 x Per Day/30 Days ary Discharge Instructions: Vashe damp to dry Secondary Dressing: (BORDER) Zetuvit Plus SILICONE BORDER Dressing 4x4 (in/in) (Home Health) 1 x Per Day/30 Days Discharge Instructions: Please do not put silicone bordered dressings under wraps. Use non-bordered dressing only. Wound #6 - Calcaneus Wound Laterality: Right Cleanser: Vashe 5.8 (oz) (Home Health) 1 x Per Day/30 Days Discharge Instructions: damp to dry daily over Santyl Secondary Dressing: ABD Pad 5x9 (in/in) 1 x Per Day/30 Days Discharge Instructions: Cover with ABD pad Secondary Dressing: Kerlix 4.5 x 4.1 (in/yd) 1 x Per Day/30 Days Discharge Instructions: Apply Kerlix 4.5 x 4.1 (in/yd) as instructed Secured With: ACE WRAP - 56M ACE Elastic Bandage With VELCRO Brand Closure, 4 (in) 1 x Per Day/30 Days Electronic Signature(s) Signed: 09/08/2023 11:21:49 AM By: Baltazar Najjar MD Signed: 09/16/2023 2:15:54 PM By: Elliot Gurney, BSN, RN, CWS, Kim RN, BSN Previous Signature: 08/20/2023 1:41:37  PM Version By: Betha Loa Previous Signature: 08/20/2023 4:49:45 PM Version By: Allen Derry PA-C Previous Signature: 08/15/2023 9:05:28 AM Version By: Betha Loa Previous Signature: 08/16/2023 2:08:40 PM Version By: Allen Derry PA-C Entered By: Elliot Gurney BSN, RN, CWS, Kim on 09/06/2023 14:00:30 -------------------------------------------------------------------------------- Problem List Details Patient Name: Date of Service: [facility], DIA [name]. 08/14/2023 1:00 PM Medical Record Number: [id number] Patient Account Number: [id number] Date of Birth/Sex: Treating RN: [birth year] (82 y.o. [name] Primary Care Provider: Barbette Reichmann Other Clinician: Betha Loa Referring Provider: Treating Provider/Extender: [name], [name], Vishwanath Weeks in Treatment: 46 Active Problems ICD-10 Encounter Code Description Active Date  MDM Diagnosis L97.822 Non-pressure chronic ulcer of other part of left lower leg with fat layer 09/26/2022 No Yes exposed L97.812 Non-pressure chronic ulcer of other part of right lower leg with fat layer 03/13/2023 No Yes exposed [name], Sydnei [name] ([id number]) [id number] Page 7 of 12 I87.312 Chronic venous hypertension (idiopathic) with ulcer of left lower extremity 09/26/2022 No Yes I89.0 Lymphedema, not elsewhere classified 09/26/2022 No Yes E11.622 Type 2 diabetes mellitus with other skin ulcer 09/26/2022 No Yes L89.610 Pressure ulcer of right heel, unstageable 09/26/2022 No Yes L89.513 Pressure ulcer of right ankle, stage 3 12/05/2022 No Yes L89.620 Pressure ulcer of left heel, unstageable 12/05/2022 No Yes M86.172 Other acute osteomyelitis, left ankle and foot 10/24/2022 No Yes L89.214 Pressure ulcer of right hip, stage 4 11/21/2022 No Yes L89.220 Pressure ulcer of left hip, unstageable 05/22/2023 No Yes I50.32 Chronic diastolic (congestive) heart failure 09/26/2022 No Yes Z79.84 Long term (current) use of oral hypoglycemic drugs 03/13/2023 No Yes Inactive Problems ICD-10 Code Description Active Date Inactive Date S81.802A Unspecified open wound, left lower leg, initial encounter 05/22/2023 05/22/2023 Resolved Problems ICD-10 Code Description Active Date Resolved Date L89.320 Pressure ulcer of left buttock, unstageable 09/26/2022 09/26/2022 S91.302A Unspecified open wound, left foot, initial encounter 10/24/2022 10/24/2022 Electronic Signature(s) Signed: 09/06/2023 1:57:54 PM By: Elliot Gurney, BSN, RN, CWS, Kim RN, BSN Signed: 09/08/2023 11:21:49 AM By: Baltazar Najjar MD Previous Signature: 08/14/2023 3:47:50 PM Version By: Baltazar Najjar MD Entered By: Elliot Gurney, BSN, RN, CWS, Kim on 09/06/2023 13:57:53 [name], [name] ([id number]) [id number] Page 8 of  12 -------------------------------------------------------------------------------- Progress Note Details Patient Name: Date of Service: [facility], DIA [name]. 08/14/2023 1:00 PM Medical Record Number: [id number] Patient Account Number: [id number] Date of Birth/Sex: Treating RN: Jun 30, 1941 (82 y.o. F) Primary Care Provider: Barbette Reichmann Other Clinician: Betha Loa Referring Provider: Treating Provider/Extender: [name], [name], Vishwanath Weeks in Treatment: 46 Subjective Chief Complaint Information obtained from Patient Right heel wound, right hip wound, right ankle wound, left ankle wound, right lower extremity wound, left anterior lower extremity wound, left hip wound History of Present Illness (HPI) 82 year old patient who comes with a referral for bilateral lower extremity edema and a lower extremity ulceration and has been sent by her PCP [name]. I understand the patient was recently put on amoxicillin and doxycycline but could not tolerate the amoxicillin. doxycycline course was completed. a BNP and EKG was supposed to be normal and the patient did not have any dyspnea. the patient has been on a diuretic. The patient was also prescribed a pair of elastic compression stockings of the 20-30 mmHg pressure variety. x-ray of the right ankle was done on 09/20/2015 and showed posttraumatic and postsurgical changes of the right ankle with secondary degenerative changes of the tibiotalar joint and to a lesser degree the subtalar joint. No definite  acute bony abnormalities are noted. Past medical history significant for diabetes mellitus, hypertension, hyperlipidemia, right breast cancer treated with a mastectomy in 2014. She has never smoked. 10/24/2015 -- she had delayed her vascular test because of her husband surgery but she is now ready to get him taken care of. He is also unable to use compression stockings and hence we will need to order her Juzo  wraps. 10/31/2015-- was seen by Dr. Wyn Quaker on 10/28/2015. She had a left lower extremity arterial duplex done at his office a couple of years ago and that was essentially normal. Today they performed a venous duplex which revealed no evidence of deep vein thrombosis, superficial thrombophlebitis, no venous reflex was seen on the right and a minimal amount of reflux was seen on the left great saphenous vein but no significant reflux was seen. Impression was that there was a component of lymphedema present from a previous surgery and he would recommend compression stockings and leg elevation. 10/2; is a patient we are following every 2 weeks. Apparently seen by Dr. Joylene Draft since she was last here no a day additional antibiotics were added. There was some suggestion about US obtaining cultures and we could bring them to her attention. The patient has stage IV wound over the right greater trochanter and unstageable area on the left but it is probably stage IV as well. She has a small area on the tip of her left heel larger wounds on the left [name] left medial foot, left medial ankle Readmission: 07-24-2022 upon evaluation today patient presents for initial inspection here in our clinic concerning issues that she has been having with her legs this is actually been going on for several years according to what her family member with her today tells me as well as what the patient reiterates as well. She is currently most recently been seeing Dr. Ether Griffins and subsequently he had her in [place] boots. However 2 weeks ago he referred her to [place] and then subsequently took her out of the [place] boot wraps at that point. At this time the left leg looks to be worse in the right leg currently. She is on Lasix and lisinopril with hydrochlorothiazide she has high blood pressure she also has issues currently with lower extremity swelling and edema which has been an ongoing issue for her as well. Patient does have a history of  chronic venous hypertension, lymphedema, diabetes mellitus type 2, hypertension, peripheral vascular disease, and neuropathy. Currently she is on Lasix as well as lisinopril with hydrochlorothiazide. 08-02-2022 upon evaluation today patient presents for reevaluation the good news is she is actually doing somewhat better in regard to the wound and the overall appearance and sinuses. The unfortunate thing is her infection really is not significantly improved we did have to switch out her antibiotic once we got that final result back and I switched her to Levaquin and away from the doxycycline. Unfortunately the doxycycline had been doing poorly for her. In fact she had had diarrhea from the time she started taking it on Friday and she is still having it when she shows up today for evaluation. Again I was not aware of this and obviously she does appear to be somewhat dehydrated as well based on what I see. My concern which I discussed with the patient today is the possibility of a C. difficile infection. With that being said fact this started immediately upon taking the doxycycline makes me think that it was just the medicine and is not  completely out of her system yet despite having taken the last dose Tuesday morning. Nonetheless with what we are seeing currently I want to be sure that reason I Minna contact her primary care provider and see if a would be willing to see her and test for C. difficile infection. 08-07-2022 upon evaluation today patient appears to be doing well currently in regard to her wounds which are actually measuring much better this is great news. Fortunately I do not see any signs of active infection locally or systemically at this time which is great as well. The good news is she was tested for a C. difficile infection and it was negative. I am very pleased and thankful for primary care provider for doing that so this means that she was just having a severe reaction to the  doxycycline we have added that to her allergy list at this point. 08-14-2022 upon evaluation today patient appears to be doing well currently in regard to her dehydration she is actually significantly improved compared to last time I saw her last week. With that being said I do not see any evidence of active infection systemically at this time which is great news. However locally she still does appear to have cellulitis in left lower extremity. I discussed with her today that I do believe she would benefit from going ahead and starting the Levaquin. Again we will concerned about the C. difficile infection of the diarrhea but that this turned out to be just an issue with a reaction to the doxycycline. My hope is that the Levaquin will not cause her any complication and will be able to treat the infection. I did review her arterial study as well which was dated on 07-31-2022. It showed that she had a ABI on the right of 1.30 and on the left of 1.27 with a TBI on the right of 1.12 and the left 1.21 this is a normal arterial study. Again on the patient's wound culture she actually did show evidence of Proteus, Morganella, and MRSA. Levaquin is a good option here across the board. [name], [name] ([id number]) [id number] Page 9 of 12 09/26/2022 [name] is a 82 year old female with a past medical history of type 2 diabetes currently controlled on oral agents, venous insufficiency/lymphedema, right breast cancer and chronic diastolic heart failure that presents to the clinic for a 1 month history of nonhealing wounds to the left lower extremity, right heel and left buttocks. She states that the buttocks wound and the right heel wound developed while she was in the hospital. She was admitted on 08/22/2022 for severe sepsis secondary to acute sigmoid and distal colonic diverticulitis. At that time it was noted she had a stage I decubitus ulcer to her bilateral buttocks.  A wound to the heel was not mentioned. She currently denies systemic signs of infection. She came into clinic in a blue gown with no undergarments. She has not been dressing the wounds. She states she just recently obtained home health and they are coming out for the first time this week. She is seen in our clinic often for lower extremity wounds secondary to venous insufficiency. 11/22; patient presents for follow-up. Son is present during the encounter. Per son it sounds like they are not doing any dressing changes. She states she has home health but they have not come out. She is going to let us know which home health agency she has been approved for so we can send orders. Currently she denies  signs of infection. 12/13; patient presents for follow-up. Patient has home health and they are coming out once a week. It appears that there has not been any dressing changes except for with home health. Patient has a newly discovered wound to the left foot. There is exposed bone. There is slight erythema and increased warmth to the surrounding tissue. Patient is completely unaware of this wound. 12/20; patient presents for follow-up. She has been taking her oral antibiotics. She now has an eschar and wound to the right hip. She has been using Dakin's wet-to-dry dressings to the left foot wound and buttocks wound. She has been using Medihoney and silver alginate to the right heel wound. She currently denies systemic signs of infection. She is mainly bedbound or in a wheelchair. 1/10; patient presents for follow-up. Patient had her left second toenail removed by Dr. Ether Griffins, podiatry on 12/21. Unfortunately she did not feel well and she was admitted to the hospital for sepsis. Her right heel wound was thought to be infected and this was debrided in the OR by Dr. Ether Griffins. Culture and bone biopsy had no growth. She has been using Medihoney to all the wound beds except for the lefty buttocks wound for which she  uses Dakin's wet-to-dry dressings. She has developed a pressure injury to the left heel now. This is despite having Prevalon boots. Although she is not wearing them today. She has completed 4 weeks of Augmentin and also a week of doxycycline for osteomyelitis of the left foot. Her left foot wound no longer probes to bone. Currently she denies signs of infection. 1/24; patient presents for follow-up. She has been using Medihoney and Hydrofera Blue to the wound beds except for this buttocks wound she has been using Dakin's wet-to-dry dressings. She has developed 2 new wounds 1 to the left heel and the other to the right medial ankle. She claims she is wearing her Prevalon boots all the time although she does not have them on today. She currently denies signs of infection. 2/21; patient presents for follow-up. She was recently hospitalized on 12/19/2022 for sepsis secondary to Enterococcus bacillus bacteremia. She was treated with IV antibiotics and has completed her discharge oral antibiotics. She had a CT scanning of the abdomen/pelvis without acute abnormalities. Husband is present with patient today. They have been using Dakin's wet-to-dry dressings to the sacral wound and hip wound and Hydrofera Blue and Medihoney to the feet wounds. They are not changing the dressings daily. It is unclear how often they actually change the dressings. She has been wearing her Prevalon boots at night but not during the day. She currently denies systemic signs of infection. 3/20; patient presents for follow-up. She again was in the hospital for severe sepsis on 2/24; MRIs at that time showed findings suspicious for osteomyelitis to the right hip. She is currently on Augmentin to complete 4 weeks of this. She is following with ID. She has been using Dakin's wet-to-dry dressings to the sacrum and right hip however the solution is starting to cause discomfort. T the rest of the wound she has been using Hydrofera Blue and  Medihoney. Patient o and son are not interested in doing palliative care or hospice. They state that they had discussions while in the hospital. 4/17; patient presents for follow-up. She has been using Vashe wet-to-dry dressings to the sacrum and right trochanter wound. She has been using Medihoney and Hydrofera Blue to the bilateral feet wounds. She has no issues or complaints today. She  denies signs of infection. 5/1; patient developed a new wound to the right leg. Son was present and states that there was a scab and he removed it creating a wound. He has been keeping the area covered. T the sacral and right trochanter wound they have been using Vashe wet-to-dry dressings and Medihoney and Hydrofera Blue to the o bilateral feet wounds. She denies signs of infection. 6/1; 1 month follow-up. She has a new area on the left anterior lower leg. In the meantime the sacral ulcer is closed. She still has an extensive wound on the right hip with exposed tendon over the greater trochanter. On the lower extremities the left anterior lower leg wound is new right ankle left heel and right heel all look reasonably stable. We are using Vashe wet-to-dry on the hip Medihoney and Hydrofera Blue on the lower extremities. She is accompanied by her son I think who is doing the dressings. They do not have a pressure-relief surface at home but they do have a hospital bed 7/10; I have not seen this patient since 03/13/2023. She was scheduled to be seen on 5/29 however missed her appointment due to illness. She was seen on 6/5 by Dr. Leanord Hawking and she presents today for her follow-up. Unfortunately her wounds have declined since I last saw her. She has several new wounds including one to the left hip wound and one left anterior lower leg. The right hip wound has declined and has maggots with bone exposed. She has been using Vashe wet- to-dry dressings to Right hip wound and Medihoney and Hydrofera Blue to the The remaining  wounds. Sacral wound remains closed. 7/24; patient presents for follow-up. At last clinic visit I recommended she go to the ED for potential admission, IV antibiotics and further imaging. She had MRI of the pelvis and left foot that showed osteomyelitis. Podiatry advised conservative management. She was also seen by infectious disease who discharged patient on 2 weeks of linezolid and 2 weeks of ciprofloxacin. Patient is at home and has home health. Palliative care was consulted during the stay and she is full code and has declined further outpatient palliative care and hospice services.. She currently denies systemic signs of infection. Overall there is improvement in the appearance of the wounds. There is healthier granulation tissue present. However this is overall poor prognosis case. She has Prevalon boots but it is unclear if she is using these. She does not have them on today. She follows up with infectious disease next week. 8/7; Patient's been using Vashe wet-to-dry dressing to all the wound beds except for the left hip she has been using Santyl. She followed up with infectious disease on 8/1. Currently she is on linezolid and ciprofloxacin. They recommended lab work to see if she needs extension of her oral antibiotics. She currently denies systemic signs of infection. 8/21; patient presents for follow-up. She has been using Vashe wet-to-dry dressings to all wounds except the left hip she has been using Santyl. All wounds have improved in size and appearance. She has healthy granulation tissue present to all wounds except the left hip still has nonviable tissue throughout. She states she has completed her oral antibiotics per ID. She currently denies systemic signs of infection. 9/4; patient presents for follow-up. She has been using Vashe wet-to-dry dressings to all wounds except the left hip. T the left hip she has been using Santyl. o All wounds appear well-healing. She currently denies  systemic signs of infection. 9/18; 2-week follow-up. This  is a very disabled woman who is nonambulatory. 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 She has significant bilateral lower extremity pressure ulcers bilaterally. We have been using Vashe wet-to-dry to most of these except Santyl in the left hip wound Objective Noland, [name] ([id number]) [phone].pdf Page 10 of 12 Constitutional Patient is hypotensive.However appears well. Pulse regular and within target range for patient.Marland Kitchen Respirations regular, non-labored and within target range.. Temperature is normal and within the target range for the patient.Marland Kitchen appears in no distress. Vitals Time Taken: 1:11 PM, Weight: 98 lbs, Temperature: 98.0 F, Pulse: 85 bpm, Respiratory Rate: 18 breaths/min, Blood Pressure: 94/57 mmHg. General Notes: Wound exam; the patient has multiple wounds including substantial areas over the greater trochanters bilaterally. The right is clean but with exposed bone. On the left and extensive debridement through the fat subcutaneous tissue. I am still not down to what I would consider viable however no exposed bone either.  She has an area on the distal left leg and the left heel. We have been using Vashe wet-to-dry here might be time to change to a different dressing soon  She has wounds on the right medial first metatarsal head right calcaneus and right medial ankle. All of these look clean Integumentary (Hair, Skin) Wound #11 status is Open. Original cause of wound was Pressure Injury. The date acquired was: 10/24/2022. The wound has been in treatment 41 weeks. The wound is located on the Right Trochanter. The wound measures 3cm length x 2.4cm width x 1.2cm depth; 5.655cm^2 area and 6.786cm^3 volume. There is muscle, tendon, Fat Layer (Subcutaneous Tissue), and fascia exposed. There is undermining starting at 12:00 and ending at 12:00 with a maximum distance  of 2.2cm. There is a medium amount of serosanguineous drainage noted. The wound margin is well defined and not attached to the wound base. There is large (67- 100%) red granulation within the wound bed. There is no necrotic tissue within the wound bed. Wound #13 status is Open. Original cause of wound was Pressure Injury. The date acquired was: 11/28/2022. The wound has been in treatment 36 weeks. The wound is located on the Left Calcaneus. The wound measures 0.4cm length x 1cm width x 0.2cm depth; 0.314cm^2 area and 0.063cm^3 volume. There is Fat Layer (Subcutaneous Tissue) exposed. There is a medium amount of serosanguineous drainage noted. The wound margin is flat and intact. There is medium (34-66%) pink granulation within the wound bed. There is a medium (34-66%) amount of necrotic tissue within the wound bed including Eschar and Adherent Slough. Wound #14 status is Open. Original cause of wound was Gradually Appeared. The date acquired was: 03/10/2023. The wound has been in treatment 22 weeks. The wound is located on the Right,Distal,Medial Lower Leg. The wound measures 8.2cm length x 3.5cm width x 0.2cm depth; 22.541cm^2 area and 4.508cm^3 volume. There is Fat Layer (Subcutaneous Tissue) exposed. There is a large amount of serosanguineous drainage noted. There is large (67-100%) red granulation within the wound bed. There is a small (1-33%) amount of necrotic tissue within the wound bed including Adherent Slough. Wound #15 status is Open. Original cause of wound was Pressure Injury. The date acquired was: 04/11/2023. The wound has been in treatment 17 weeks. The wound is located on the Left,Anterior Lower Leg. The wound measures 0.1cm length x 0.1cm width x 0.1cm depth; 0.008cm^2 area and 0.001cm^3 volume. There is tendon and Fat Layer (Subcutaneous Tissue) exposed. There is a none present amount of drainage  noted. There is no granulation within the wound bed. There is no necrotic tissue within  the wound bed. Wound #16 status is Open. Original cause of wound was Pressure Injury. The date acquired was: 05/22/2023. The wound has been in treatment 12 weeks. The wound is located on the Left Trochanter. The wound measures 1.9cm length x 1.9cm width x 0.9cm depth; 2.835cm^2 area and 2.552cm^3 volume. There is Fat Layer (Subcutaneous Tissue) exposed. There is a medium amount of serosanguineous drainage noted. There is a large (67-100%) amount of necrotic tissue within the wound bed including Eschar and Adherent Slough. Wound #18 status is Open. Original cause of wound was Pressure Injury. The date acquired was: 07/31/2023. The wound has been in treatment 2 weeks. The wound is located on the Right Metatarsal head first. The wound measures 1cm length x 0.3cm width x 0.2cm depth; 0.236cm^2 area and 0.047cm^3 volume. There is Fat Layer (Subcutaneous Tissue) exposed. There is a medium amount of serosanguineous drainage noted. There is medium (34-66%) red granulation within the wound bed. There is a medium (34-66%) amount of necrotic tissue within the wound bed including Eschar and Adherent Slough. Wound #6 status is Open. Original cause of wound was Pressure Injury. The date acquired was: 07/23/2022. The wound has been in treatment 46 weeks. The wound is located on the Right Calcaneus. The wound measures 2cm length x 1.8cm width x 0.1cm depth; 2.827cm^2 area and 0.283cm^3 volume. There is Fat Layer (Subcutaneous Tissue) exposed. There is a medium amount of serosanguineous drainage noted. The wound margin is distinct with the outline attached to the wound base. There is medium (34-66%) red, pink granulation within the wound bed. There is a medium (34-66%) amount of necrotic tissue within the wound bed including Eschar and Adherent Slough. Assessment Active Problems ICD-10 Non-pressure chronic ulcer of other part of left lower leg with fat layer exposed Non-pressure chronic ulcer of other part of right  lower leg with fat layer exposed Chronic venous hypertension (idiopathic) with ulcer of left lower extremity Lymphedema, not elsewhere classified Type 2 diabetes mellitus with other skin ulcer Pressure ulcer of right heel, unstageable Pressure ulcer of right ankle, stage 3 Pressure ulcer of left heel, unstageable Other acute osteomyelitis, left ankle and foot Pressure ulcer of right hip, stage 4 Pressure ulcer of left hip, unstageable Chronic diastolic (congestive) heart failure Long term (current) use of oral hypoglycemic drugs Procedures Wound #16 Pre-procedure diagnosis of Wound #16 is a Pressure Ulcer located on the Left Trochanter . There was a Excisional Skin/Subcutaneous Tissue Debridement with a total area of 2.83 sq cm performed by Allen Derry, PA-C. With the following instrument(s): Curette to remove Viable and Non-Viable tissue/material. Material removed includes Subcutaneous Tissue. A time out was conducted at 13:48, prior to the start of the procedure. A Minimum amount of bleeding was controlled with Pressure. The procedure was tolerated well. Post Debridement Measurements: 1.9cm length x 1.9cm width x 1.2cm depth; 3.402cm^3 volume. Post debridement Stage noted as Unstageable/Unclassified. Character of Wound/Ulcer Post Debridement is stable. [name], [name] ([id number]) [id number] Page 11 of 12 Post procedure Diagnosis Wound #16: Same as Pre-Procedure Plan Follow-up Appointments: Return Appointment in 2 weeks. Home Health: Home Health Company: - Harlin Rain, RN [phone] [facility] for wound care. May utilize formulary equivalent dressing for wound treatment orders unless otherwise specified. Home Health Nurse may visit PRN to address patients wound care needs. - left trochanter, santyl daily all other wounds- vashe damp tp dry daily   Off-Loading:Low  air-loss mattress (Group 2) WOUND #11: - Trochanter Wound Laterality:  Right Cleanser: Vashe 5.8 (oz) (Home Health) 1 x Per Day/30 Days Discharge Instructions: damp to dry daily Secondary Dressing: (BORDER) Zetuvit Plus SILICONE BORDER Dressing 5x5 (in/in) 1 x Per Day/30 Days Discharge Instructions: Please do not put silicone bordered dressings under wraps. Use non-bordered dressing only. WOUND #13: - Calcaneus Wound Laterality: Left Cleanser: Vashe 5.8 (oz) (Home Health) 1 x Per Day/30 Days Discharge Instructions: damp to dry daily Secondary Dressing: ABD Pad 5x9 (in/in) 1 x Per Day/30 Days Discharge Instructions: Cover with ABD pad Secondary Dressing: Kerlix 4.5 x 4.1 (in/yd) 1 x Per Day/30 Days Discharge Instructions: Apply Kerlix 4.5 x 4.1 (in/yd) as instructed Secured With: ACE WRAP - 245M ACE Elastic Bandage With VELCRO Brand Closure, 4 (in) 1 x Per Day/30 Days WOUND #14: - Lower Leg Wound Laterality: Right, Medial, Distal Cleanser: Vashe 5.8 (oz) (Home Health) 1 x Per Day/30 Days Discharge Instructions: damp to dry daily Secondary Dressing: ABD Pad 5x9 (in/in) 1 x Per Day/30 Days Discharge Instructions: Cover with ABD pad Secondary Dressing: Kerlix 4.5 x 4.1 (in/yd) 1 x Per Day/30 Days Discharge Instructions: Apply Kerlix 4.5 x 4.1 (in/yd) as instructed Secured With: ACE WRAP - 245M ACE Elastic Bandage With VELCRO Brand Closure, 4 (in) 1 x Per Day/30 Days WOUND #15: - Lower Leg Wound Laterality: Left, Anterior Cleanser: Vashe 5.8 (oz) (Home Health) 1 x Per Day/30 Days Discharge Instructions: damp to dry daily Secondary Dressing: ABD Pad 5x9 (in/in) 1 x Per Day/30 Days Discharge Instructions: Cover with ABD pad Secondary Dressing: Kerlix 4.5 x 4.1 (in/yd) 1 x Per Day/30 Days Discharge Instructions: Apply Kerlix 4.5 x 4.1 (in/yd) as instructed Secured With: ACE WRAP - 245M ACE Elastic Bandage With VELCRO Brand Closure, 4 (in) 1 x Per Day/30 Days WOUND #16: - Trochanter Wound Laterality: Left Cleanser: Normal Saline (Home Health) 1 x Per Day/30  Days Discharge Instructions: Wash your hands with soap and water. Remove old dressing, discard into plastic bag and place into trash. Cleanse the wound with Normal Saline prior to applying a clean dressing using gauze sponges, not tissues or cotton balls. Do not scrub or use excessive force. Pat dry using gauze sponges, not tissue or cotton balls. Topical: Santyl Collagenase Ointment, 30 (gm), tube (Home Health) 1 x Per Day/30 Days Discharge Instructions: apply nickel thick to wound bed only Topical: Silver-Sept Hydrogel, 1.5 (oz) Tube 1 x Per Day/30 Days Discharge Instructions: soak gauze with Hydrogel and pack into wound like wet to dry dressing Prim Dressing: Gauze 1 x Per Day/30 Days ary Discharge Instructions: Vashe damp to dry over Santyl Secondary Dressing: (BORDER) Zetuvit Plus SILICONE BORDER Dressing 4x4 (in/in) (Home Health) 1 x Per Day/30 Days Discharge Instructions: Please do not put silicone bordered dressings under wraps. Use non-bordered dressing only. WOUND #18: - Metatarsal head first Wound Laterality: Right Cleanser: Normal Saline (Home Health) 1 x Per Day/30 Days Discharge Instructions: Wash your hands with soap and water. Remove old dressing, discard into plastic bag and place into trash. Cleanse the wound with Normal Saline prior to applying a clean dressing using gauze sponges, not tissues or cotton balls. Do not scrub or use excessive force. Pat dry using gauze sponges, not tissue or cotton balls. Prim Dressing: Gauze 1 x Per Day/30 Days ary Discharge Instructions: Vashe damp to dry Secondary Dressing: (BORDER) Zetuvit Plus SILICONE BORDER Dressing 4x4 (in/in) (Home Health) 1 x Per Day/30 Days Discharge Instructions: Please do not put  silicone bordered dressings under wraps. Use non-bordered dressing only. WOUND #6: - Calcaneus Wound Laterality: Right Cleanser: Vashe 5.8 (oz) (Home Health) 1 x Per Day/30 Days Discharge Instructions: damp to dry daily over  Santyl Secondary Dressing: ABD Pad 5x9 (in/in) 1 x Per Day/30 Days Discharge Instructions: Cover with ABD pad Secondary Dressing: Kerlix 4.5 x 4.1 (in/yd) 1 x Per Day/30 Days Discharge Instructions: Apply Kerlix 4.5 x 4.1 (in/yd) as instructed Secured With: ACE WRAP - 2M ACE Elastic Bandage With VELCRO Brand Closure, 4 (in) 1 x Per Day/30 Days 1. I have continued with Santyl to the wound on the left hip. Vashe wet-to-dry on all of the rest of her extensive wounds although he is having trouble getting Vashe wet-to-dry he has a wound cleanser. 2. When I see her the next time it may be time for an alternative dressing on the lower legs 3. At this point in time nothing really appeared to need culturing although we will need to see what the base of the left hip looks like we are close now Electronic Signature(s) Signed: 09/06/2023 1:59:02 PM By: Elliot Gurney, BSN, RN, CWS, Kim RN, BSN Signed: 09/08/2023 11:21:49 AM By: Baltazar Najjar MD Previous Signature: 08/14/2023 3:47:50 PM Version By: Baltazar Najjar MD Nicola Police ([id number]) [phone].pdf Page 12 of 12 Previous Signature: 08/14/2023 3:47:50 PM Version By: Baltazar Najjar MD Entered By: Elliot Gurney, BSN, RN, CWS, Kim on 09/06/2023 13:59:01 -------------------------------------------------------------------------------- SuperBill Details Patient Name: Date of Service: [facility], DIA [name]. 08/14/2023 Medical Record Number: [id number] Patient Account Number: [id number] Date of Birth/Sex: Treating RN: 22-Oct-1941 (82 y.o. [name] Primary Care Provider: Barbette Reichmann Other Clinician: Betha Loa Referring Provider: Treating Provider/Extender: [name], [name], Vishwanath Weeks in Treatment: 46 Diagnosis Coding ICD-10 Codes Code Description [phone] Non-pressure chronic ulcer of other part of left lower leg with fat layer exposed L97.812 Non-pressure chronic ulcer of other part of right lower  leg with fat layer exposed I87.312 Chronic venous hypertension (idiopathic) with ulcer of left lower extremity I89.0 Lymphedema, not elsewhere classified E11.622 Type 2 diabetes mellitus with other skin ulcer L89.610 Pressure ulcer of right heel, unstageable L89.513 Pressure ulcer of right ankle, stage 3 L89.620 Pressure ulcer of left heel, unstageable M86.172 Other acute osteomyelitis, left ankle and foot L89.214 Pressure ulcer of right hip, stage 4 L89.220 Pressure ulcer of left hip, unstageable I50.32 Chronic diastolic (congestive) heart failure Z79.84 Long term (current) use of oral hypoglycemic drugs Facility Procedures : CPT4 Code: [id number] Description: 11042 - DEB SUBQ TISSUE 20 SQ CM/< ICD-10 Diagnosis Description L89.220 Pressure ulcer of left hip, unstageable Modifier: Quantity: 1 Physician Procedures : CPT4 Code Description Modifier [id number] 11042 - WC PHYS SUBQ TISS 20 SQ CM ICD-10 Diagnosis Description L89.220 Pressure ulcer of left hip, unstageable Quantity: 1 Electronic Signature(s) Signed: 08/20/2023 1:41:37 PM By: Betha Loa Signed: 08/22/2023 4:27:05 PM By: Baltazar Najjar MD Previous Signature: 08/14/2023 3:47:50 PM Version By: Baltazar Najjar MD Entered By: Betha Loa on 08/20/2023 13:14:20

## 2023-09-17 DIAGNOSIS — L89894 Pressure ulcer of other site, stage 4: Secondary | ICD-10-CM | POA: Diagnosis not present

## 2023-09-17 DIAGNOSIS — L89890 Pressure ulcer of other site, unstageable: Secondary | ICD-10-CM | POA: Diagnosis not present

## 2023-09-17 DIAGNOSIS — L89613 Pressure ulcer of right heel, stage 3: Secondary | ICD-10-CM | POA: Diagnosis not present

## 2023-09-17 DIAGNOSIS — L89623 Pressure ulcer of left heel, stage 3: Secondary | ICD-10-CM | POA: Diagnosis not present

## 2023-09-18 ENCOUNTER — Encounter: Payer: PPO | Attending: Physician Assistant | Admitting: Physician Assistant

## 2023-09-18 DIAGNOSIS — I110 Hypertensive heart disease with heart failure: Secondary | ICD-10-CM | POA: Insufficient documentation

## 2023-09-18 DIAGNOSIS — E1140 Type 2 diabetes mellitus with diabetic neuropathy, unspecified: Secondary | ICD-10-CM | POA: Diagnosis not present

## 2023-09-18 DIAGNOSIS — Z7984 Long term (current) use of oral hypoglycemic drugs: Secondary | ICD-10-CM | POA: Insufficient documentation

## 2023-09-18 DIAGNOSIS — Z7401 Bed confinement status: Secondary | ICD-10-CM | POA: Insufficient documentation

## 2023-09-18 DIAGNOSIS — M24562 Contracture, left knee: Secondary | ICD-10-CM | POA: Insufficient documentation

## 2023-09-18 DIAGNOSIS — I890 Lymphedema, not elsewhere classified: Secondary | ICD-10-CM | POA: Insufficient documentation

## 2023-09-18 DIAGNOSIS — M86172 Other acute osteomyelitis, left ankle and foot: Secondary | ICD-10-CM | POA: Insufficient documentation

## 2023-09-18 DIAGNOSIS — L97812 Non-pressure chronic ulcer of other part of right lower leg with fat layer exposed: Secondary | ICD-10-CM | POA: Insufficient documentation

## 2023-09-18 DIAGNOSIS — Z993 Dependence on wheelchair: Secondary | ICD-10-CM | POA: Insufficient documentation

## 2023-09-18 DIAGNOSIS — L89214 Pressure ulcer of right hip, stage 4: Secondary | ICD-10-CM | POA: Diagnosis not present

## 2023-09-18 DIAGNOSIS — L89610 Pressure ulcer of right heel, unstageable: Secondary | ICD-10-CM | POA: Insufficient documentation

## 2023-09-18 DIAGNOSIS — L97822 Non-pressure chronic ulcer of other part of left lower leg with fat layer exposed: Secondary | ICD-10-CM | POA: Diagnosis not present

## 2023-09-18 DIAGNOSIS — I872 Venous insufficiency (chronic) (peripheral): Secondary | ICD-10-CM | POA: Insufficient documentation

## 2023-09-18 DIAGNOSIS — E1151 Type 2 diabetes mellitus with diabetic peripheral angiopathy without gangrene: Secondary | ICD-10-CM | POA: Insufficient documentation

## 2023-09-18 DIAGNOSIS — E11622 Type 2 diabetes mellitus with other skin ulcer: Secondary | ICD-10-CM | POA: Diagnosis not present

## 2023-09-18 DIAGNOSIS — M1990 Unspecified osteoarthritis, unspecified site: Secondary | ICD-10-CM | POA: Insufficient documentation

## 2023-09-18 DIAGNOSIS — L89220 Pressure ulcer of left hip, unstageable: Secondary | ICD-10-CM | POA: Diagnosis not present

## 2023-09-18 DIAGNOSIS — L89620 Pressure ulcer of left heel, unstageable: Secondary | ICD-10-CM | POA: Diagnosis not present

## 2023-09-18 DIAGNOSIS — I5032 Chronic diastolic (congestive) heart failure: Secondary | ICD-10-CM | POA: Insufficient documentation

## 2023-09-18 NOTE — Progress Notes (Signed)
[name], [name] ([id number]) [id number] Page 1 of 12 Visit Report for 09/18/2023 Chief Complaint Document Details Patient Name: Date of Service: [name], [place] [name] [name]. 09/18/2023 3:30 PM Medical Record Number: [id number] Patient Account Number: [id number] Date of Birth/Sex: Treating RN: 10-Jul-1941 (82 y.o. [name] Primary Care Provider: Barbette [name] Other Clinician: Betha [name] Referring Provider: Treating Provider/Extender: [name] Weeks in Treatment: 13 Information Obtained from: Patient Chief Complaint Right heel wound, right hip wound, right ankle wound, left ankle wound, right lower extremity wound, left anterior lower extremity wound, left hip wound Electronic Signature(s) Signed: 09/18/2023 3:59:31 PM By: [name] Derry PA-C Entered By: [name] on 09/18/2023 12:59:31 -------------------------------------------------------------------------------- HPI Details Patient Name: Date of Service: [name], [name] [name] [name]. 09/18/2023 3:30 PM Medical Record Number: [id number] Patient Account Number: [id number] Date of Birth/Sex: Treating RN: [name], 1941 (82 y.o. [name] Primary Care Provider: Barbette [name] Other Clinician: Betha [name] Referring Provider: Treating Provider/Extender: [name] Weeks in Treatment: 81 History of Present Illness HPI Description: 82 year old patient who comes with a referral for bilateral lower extremity edema and a lower extremity ulceration and has been sent by her PCP [name]. I understand the patient was recently put on amoxicillin and doxycycline but could not tolerate the amoxicillin. doxycycline course was completed. a BNP and EKG was supposed to be normal and the patient did not have any dyspnea. the patient has been on a diuretic. The patient was also prescribed a pair of elastic compression stockings of the 20-30 mmHg pressure variety. x-ray of  the right ankle was done on 09/20/2015 and showed posttraumatic and postsurgical changes of the right ankle with secondary degenerative changes of the tibiotalar joint and to a lesser degree the subtalar joint. No definite acute bony abnormalities are noted. Past medical history significant for diabetes mellitus, hypertension, hyperlipidemia, right breast cancer treated with a mastectomy in 2014. She has never smoked. 10/24/2015 -- she had delayed her vascular test because of her husband surgery but she is now ready to get him taken care of. He is also unable to use compression stockings and hence we will need to order her Juzo wraps. 10/31/2015-- was seen by Dr. Wyn [name] on 10/28/2015. She had a left lower extremity arterial duplex done at his office a couple of years ago and that was essentially normal. Today they performed a venous duplex which revealed no evidence of deep vein thrombosis, superficial thrombophlebitis, no venous reflex was seen on the right and a minimal amount of reflux was seen on the left great saphenous vein but no significant reflux was seen. Impression was that there was a component of lymphedema present from a previous surgery and he would recommend compression stockings and leg elevation. [name], [name] ([id number]) [id number] Page 2 of 12 10/2; is a patient we are following every 2 weeks. Apparently seen by Dr. Joylene [name] since she was last here no a day additional antibiotics were added. There was some suggestion about US obtaining cultures and we could bring them to her attention. The patient has stage IV wound over the right greater trochanter and unstageable area on the left but it is probably stage IV as well. She has a small area on the tip of her left heel larger wounds on the left [name] left medial foot, left medial ankle Readmission: 07-24-2022 upon evaluation today patient presents for initial inspection here in our clinic concerning  issues that she has been having with her legs this is  actually been going on for several years according to what her family member with her today tells me as well as what the patient reiterates as well. She is currently most recently been seeing [name] and subsequently he had her in [place] boots. However 2 weeks ago he referred her to [place] and then subsequently took her out of the [place] boot wraps at that point. At this time the left leg looks to be worse in the right leg currently. She is on Lasix and lisinopril with hydrochlorothiazide she has high blood pressure she also has issues currently with lower extremity swelling and edema which has been an ongoing issue for her as well. Patient does have a history of chronic venous hypertension, lymphedema, diabetes mellitus type 2, hypertension, peripheral vascular disease, and neuropathy. Currently she is on Lasix as well as lisinopril with hydrochlorothiazide. 08-02-2022 upon evaluation today patient presents for reevaluation the good news is she is actually doing somewhat better in regard to the wound and the overall appearance and sinuses. The unfortunate thing is her infection really is not significantly improved we did have to switch out her antibiotic once we got that final result back and I switched her to Levaquin and away from the doxycycline. Unfortunately the doxycycline had been doing poorly for her. In fact she had had diarrhea from the time she started taking it on Friday and she is still having it when she shows up today for evaluation. Again I was not aware of this and obviously she does appear to be somewhat dehydrated as well based on what I see. My concern which I discussed with the patient today is the possibility of a C. difficile infection. With that being said fact this started immediately upon taking the doxycycline makes me think that it was just the medicine and is not completely out of her system yet despite having taken the last  dose Tuesday morning. Nonetheless with what we are seeing currently I want to be sure that reason I Minna contact her primary care provider and see if a would be willing to see her and test for C. difficile infection. 08-07-2022 upon evaluation today patient appears to be doing well currently in regard to her wounds which are actually measuring much better this is great news. Fortunately I do not see any signs of active infection locally or systemically at this time which is great as well. The good news is she was tested for a C. difficile infection and it was negative. I am very pleased and thankful for primary care provider for doing that so this means that she was just having a severe reaction to the doxycycline we have added that to her allergy list at this point. 08-14-2022 upon evaluation today patient appears to be doing well currently in regard to her dehydration she is actually significantly improved compared to last time I saw her last week. With that being said I do not see any evidence of active infection systemically at this time which is great news. However locally she still does appear to have cellulitis in left lower extremity. I discussed with her today that I do believe she would benefit from going ahead and starting the Levaquin. Again we will concerned about the C. difficile infection of the diarrhea but that this turned out to be just an issue with a reaction to the doxycycline. My hope is that the Levaquin will not cause her any complication and will be able to treat the infection. I did  review her arterial study as well which was dated on 07-31-2022. It showed that she had a ABI on the right of 1.30 and on the left of 1.27 with a TBI on the right of 1.12 and the left 1.21 this is a normal arterial study. Again on the patient's wound culture she actually did show evidence of Proteus, Morganella, and MRSA. Levaquin is a good option here across the board. 09/26/2022 Ms. Diane  Koehn is a 82 year old female with a past medical history of type 2 diabetes currently controlled on oral agents, venous insufficiency/lymphedema, right breast cancer and chronic diastolic heart failure that presents to the clinic for a 1 month history of nonhealing wounds to the left lower extremity, right heel and left buttocks. She states that the buttocks wound and the right heel wound developed while she was in the hospital. She was admitted on 08/22/2022 for severe sepsis secondary to acute sigmoid and distal colonic diverticulitis. At that time it was noted she had a stage I decubitus ulcer to her bilateral buttocks. A wound to the heel was not mentioned. She currently denies systemic signs of infection. She came into clinic in a blue gown with no undergarments. She has not been dressing the wounds. She states she just recently obtained home health and they are coming out for the first time this week. She is seen in our clinic often for lower extremity wounds secondary to venous insufficiency. 11/22; patient presents for follow-up. Son is present during the encounter. Per son it sounds like they are not doing any dressing changes. She states she has home health but they have not come out. She is going to let us know which home health agency she has been approved for so we can send orders. Currently she denies signs of infection. 12/13; patient presents for follow-up. Patient has home health and they are coming out once a week. It appears that there has not been any dressing changes except for with home health. Patient has a newly discovered wound to the left foot. There is exposed bone. There is slight erythema and increased warmth to the surrounding tissue. Patient is completely unaware of this wound. 12/20; patient presents for follow-up. She has been taking her oral antibiotics. She now has an eschar and wound to the right hip. She has been using Dakin's wet-to-dry dressings to the left  foot wound and buttocks wound. She has been using Medihoney and silver alginate to the right heel wound. She currently denies systemic signs of infection. She is mainly bedbound or in a wheelchair. 1/10; patient presents for follow-up. Patient had her left second toenail removed by [name], podiatry on 12/21. Unfortunately she did not feel well and she was admitted to the hospital for sepsis. Her right heel wound was thought to be infected and this was debrided in the OR by [name]. Culture and bone biopsy had no growth. She has been using Medihoney to all the wound beds except for the lefty buttocks wound for which she uses Dakin's wet-to-dry dressings. She has developed a pressure injury to the left heel now. This is despite having Prevalon boots. Although she is not wearing them today. She has completed 4 weeks of Augmentin and also a week of doxycycline for osteomyelitis of the left foot. Her left foot wound no longer probes to bone. Currently she denies signs of infection. 1/24; patient presents for follow-up. She has been using Medihoney and Hydrofera Blue to the wound beds except for this buttocks  wound she has been using Dakin's wet-to-dry dressings. She has developed 2 new wounds 1 to the left heel and the other to the right medial ankle. She claims she is wearing her Prevalon boots all the time although she does not have them on today. She currently denies signs of infection. 2/21; patient presents for follow-up. She was recently hospitalized on 12/19/2022 for sepsis secondary to Enterococcus bacillus bacteremia. She was treated with IV antibiotics and has completed her discharge oral antibiotics. She had a CT scanning of the abdomen/pelvis without acute abnormalities. Husband is present with patient today. They have been using Dakin's wet-to-dry dressings to the sacral wound and hip wound and Hydrofera Blue and Medihoney to the feet wounds. They are not changing the dressings daily. It  is unclear how often they actually change the dressings. She has been wearing her Prevalon boots at night but not during the day. She currently denies systemic signs of infection. 3/20; patient presents for follow-up. She again was in the hospital for severe sepsis on 2/24; MRIs at that time showed findings suspicious for osteomyelitis to the right hip. She is currently on Augmentin to complete 4 weeks of this. She is following with ID. She has been using Dakin's wet-to-dry dressings to the sacrum and right hip however the solution is starting to cause discomfort. T the rest of the wound she has been using Hydrofera Blue and Medihoney. Patient o and son are not interested in doing palliative care or hospice. They state that they had discussions while in the hospital. 4/17; patient presents for follow-up. She has been using Vashe wet-to-dry dressings to the sacrum and right trochanter wound. She has been using Medihoney and Hydrofera Blue to the bilateral feet wounds. She has no issues or complaints today. She denies signs of infection. 5/1; patient developed a new wound to the right leg. Son was present and states that there was a scab and he removed it creating a wound. He has been keeping the area covered. T the sacral and right trochanter wound they have been using Vashe wet-to-dry dressings and Medihoney and Hydrofera Blue to the o bilateral feet wounds. She denies signs of infection. 6/1; 1 month follow-up. She has a new area on the left anterior lower leg. In the meantime the sacral ulcer is closed. She still has an extensive wound on the [name] [name] ([id number]) [id number] Page 3 of 12 right hip with exposed tendon over the greater trochanter. On the lower extremities the left anterior lower leg wound is new right ankle left heel and right heel all look reasonably stable. We are using Vashe wet-to-dry on the hip Medihoney and Hydrofera Blue on the lower  extremities. She is accompanied by her son I think who is doing the dressings. They do not have a pressure-relief surface at home but they do have a hospital bed 7/10; I have not seen this patient since 03/13/2023. She was scheduled to be seen on 5/29 however missed her appointment due to illness. She was seen on 6/5 by Dr. Leanord Hawking and she presents today for her follow-up. Unfortunately her wounds have declined since I last saw her. She has several new wounds including one to the left hip wound and one left anterior lower leg. The right hip wound has declined and has maggots with bone exposed. She has been using Vashe wet- to-dry dressings to Right hip wound and Medihoney and Hydrofera Blue to the The remaining wounds. Sacral wound remains closed. 7/24; patient presents for  follow-up. At last clinic visit I recommended she go to the ED for potential admission, IV antibiotics and further imaging. She had MRI of the pelvis and left foot that showed osteomyelitis. Podiatry advised conservative management. She was also seen by infectious disease who discharged patient on 2 weeks of linezolid and 2 weeks of ciprofloxacin. Patient is at home and has home health. Palliative care was consulted during the stay and she is full code and has declined further outpatient palliative care and hospice services.. She currently denies systemic signs of infection. Overall there is improvement in the appearance of the wounds. There is healthier granulation tissue present. However this is overall poor prognosis case. She has Prevalon boots but it is unclear if she is using these. She does not have them on today. She follows up with infectious disease next week. 8/7; Patient's been using Vashe wet-to-dry dressing to all the wound beds except for the left hip she has been using Santyl. She followed up with infectious disease on 8/1. Currently she is on linezolid and ciprofloxacin. They recommended lab work to see if she needs  extension of her oral antibiotics. She currently denies systemic signs of infection. 8/21; patient presents for follow-up. She has been using Vashe wet-to-dry dressings to all wounds except the left hip she has been using Santyl. All wounds have improved in size and appearance. She has healthy granulation tissue present to all wounds except the left hip still has nonviable tissue throughout. She states she has completed her oral antibiotics per ID. She currently denies systemic signs of infection. 9/4; patient presents for follow-up. She has been using Vashe wet-to-dry dressings to all wounds except the left hip. T the left hip she has been using Santyl. o All wounds appear well-healing. She currently denies systemic signs of infection. 9/18; 2-week follow-up. This is a very disabled woman who is nonambulatory. 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 She has significant bilateral lower extremity pressure ulcers bilaterally. 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 She is not ambulatory. She has extensive wounds on both trochanters. The right has exposed bone in the left nonviable tissue. I aggressively debrided this the last time she was here. We have been using Santyl on the left hip and Vashe wet-to-dry on the right hip. She has wounds on her bilateral lower legs extensively on the right medial foot first met head ankle and lower leg area on the left lower leg and left heel. All of these seem somewhat improved again using Vashe wet-to-dry. We have been attempting to get a level 3 surface or some form of offloading surface for the patient although having trouble with their insurance which apparently is [name] 10/16; patient is doing very well. The wounds on the lower extremity even the extensive areas seem to be a lot better with  healthy granulation. We have been using the [facility] equivalent of Vashe wet-to-dry. There is areas over the lateral part of the greater trochanters. The area on the left looks a lot better we have been using Santyl with backing wet to dry gauze. We have been using Vashe wet-to-dry in the right greater trochanter. I think we are in line with changing the dressing next week to both hip areas We are apparently finally making some inroads in getting her a level 3 surface for  her bed 10/23; patient continues to make nice improvement in the bilateral lower leg wounds using the equivalent of Vashe wet-to-dry. I did plan to change this dressing but the wounds are making such a nice improvement I continued it. T oday I have changed the greater trochanter stage IV pressure ulcers to a Prisma and then hydrogel wet-to-dry packing with border foam change every 2 D. Really remarkable improvement credit to the family this looking after her 09-18-2023 upon evaluation today patient appears to be doing more poorly especially in regard to her heels and lower extremities where she has deep tissue injuries on both heels and she has new blisters that have opened up on her legs. This is due to the fact that she is not elevating her legs she is staying in her wheelchair sitting not moving around throughout the day and this is not the best thing for her to be honest. Fortunately I do not think she is infected right now but at the same time this is still concerning to me. Electronic Signature(s) Signed: 09/18/2023 5:05:05 PM By: [name] Derry PA-C Entered By: [name] on 09/18/2023 14:05:05 -------------------------------------------------------------------------------- Physical Exam Details Patient Name: Date of Service: [facility], [name] [name] [name]. 09/18/2023 3:30 PM Medical Record Number: [id number] Patient Account Number: [id number] Date of Birth/Sex: Treating RN: [birth year] (82 y.o. [name] Primary Care Provider: Barbette [name] Other Clinician: Betha [name] Referring Provider: Treating Provider/Extender: [name] Weeks in Treatment: [address], [place] [name] ([id number]) [id number] Page 4 of 12 Constitutional Well-nourished and well-hydrated in no acute distress. Respiratory normal breathing without difficulty. Psychiatric this patient is able to make decisions and demonstrates good insight into disease process. Alert and Oriented x 3. pleasant and cooperative. Notes Upon inspection patient's wound bed actually showed signs of poor granulation and epithelization in a lot of areas due to deep tissue injury especially on the heels. Her legs do not look too bad but at the same time several of these ulcers are new just in the past day or so. Her hip ulcers on the right side actually showed signs of being worse the left side looks a little better we are using collagen and then hydrogel with packing and behind. Electronic Signature(s) Signed: 09/18/2023 5:05:36 PM By: [name] Derry PA-C Entered By: [name] on 09/18/2023 14:05:36 -------------------------------------------------------------------------------- Physician Orders Details Patient Name: Date of Service: [facility], [name] [name] [name]. 09/18/2023 3:30 PM Medical Record Number: [id number] Patient Account Number: [id number] Date of Birth/Sex: Treating RN: 01-19-1941 (82 y.o. [name] Primary Care Provider: Barbette [name] Other Clinician: Betha [name] Referring Provider: Treating Provider/Extender: [name] Weeks in Treatment: 58 The following information was scribed by: [name] The information was scribed for: [name] Verbal / Phone Orders: No Diagnosis Coding ICD-10 Coding Code Description [phone] Non-pressure chronic ulcer of other part of left lower leg with fat layer exposed L97.812 Non-pressure chronic ulcer of other part of right lower leg with fat  layer exposed I87.312 Chronic venous hypertension (idiopathic) with ulcer of left lower extremity I89.0 Lymphedema, not elsewhere classified E11.622 Type 2 diabetes mellitus with other skin ulcer L89.610 Pressure ulcer of right heel, unstageable L89.513 Pressure ulcer of right ankle, stage 3 L89.620 Pressure ulcer of left heel, unstageable M86.172 Other acute osteomyelitis, left ankle and foot L89.214 Pressure ulcer of right hip, stage 4 L89.220 Pressure ulcer of left hip, unstageable I50.32 Chronic diastolic (congestive) heart failure Z79.84 Long term (current) use of oral hypoglycemic drugs Follow-up Appointments  Return Appointment in 2 weeks. Home Health Home Health Company: - Harlin Rain, RN [phone] [facility] for wound care. May utilize formulary equivalent dressing for wound treatment orders unless otherwise specified. Home Health Nurse may visit PRN to address patients wound care needs. - left and right Trochanter: Prisma packed into wound then Hydrogel or surgical lube covered gauze over prisma all other wounds- vashe damp to dry daily Vidas, Gal [name] ([id number]) [id number] Page 5 of 12 Off-Loading A fluidized (Group 3) ir Wound Treatment Wound #11 - Trochanter Wound Laterality: Right Cleanser: Vashe 5.8 (oz) 1 x Per Day/30 Days Discharge Instructions: Use as directed for cleaning wound Prim Dressing: Prisma 4.34 (in) 1 x Per Day/30 Days ary Discharge Instructions: pack into tunnel and wound Secondary Dressing: Gauze 1 x Per Day/30 Days Discharge Instructions: moisten with Hydrogel or surgical lube Secondary Dressing: (BORDER) Zetuvit Plus SILICONE BORDER Dressing 5x5 (in/in) 1 x Per Day/30 Days Discharge Instructions: Please do not put silicone bordered dressings under wraps. Use non-bordered dressing only. Wound #13 - Calcaneus Wound Laterality: Left Cleanser: Vashe 5.8 (oz) (Home Health) 1 x Per Day/30  Days Discharge Instructions: damp to dry daily Secondary Dressing: ABD Pad 5x9 (in/in) 1 x Per Day/30 Days Discharge Instructions: Cover with ABD pad Secondary Dressing: Kerlix 4.5 x 4.1 (in/yd) 1 x Per Day/30 Days Discharge Instructions: Apply Kerlix 4.5 x 4.1 (in/yd) as instructed Secured With: ACE WRAP - 15M ACE Elastic Bandage With VELCRO Brand Closure, 4 (in) 1 x Per Day/30 Days Wound #14 - Lower Leg Wound Laterality: Right, Medial, Distal Cleanser: Vashe 5.8 (oz) (Home Health) 1 x Per Day/30 Days Discharge Instructions: damp to dry daily Secondary Dressing: ABD Pad 5x9 (in/in) 1 x Per Day/30 Days Discharge Instructions: Cover with ABD pad Secondary Dressing: Kerlix 4.5 x 4.1 (in/yd) 1 x Per Day/30 Days Discharge Instructions: Apply Kerlix 4.5 x 4.1 (in/yd) as instructed Secured With: ACE WRAP - 15M ACE Elastic Bandage With VELCRO Brand Closure, 4 (in) 1 x Per Day/30 Days Wound #15 - Lower Leg Wound Laterality: Left, Anterior Cleanser: Vashe 5.8 (oz) (Home Health) 1 x Per Day/30 Days Discharge Instructions: damp to dry daily Secondary Dressing: ABD Pad 5x9 (in/in) 1 x Per Day/30 Days Discharge Instructions: Cover with ABD pad Secondary Dressing: Kerlix 4.5 x 4.1 (in/yd) 1 x Per Day/30 Days Discharge Instructions: Apply Kerlix 4.5 x 4.1 (in/yd) as instructed Secured With: ACE WRAP - 15M ACE Elastic Bandage With VELCRO Brand Closure, 4 (in) 1 x Per Day/30 Days Wound #16 - Trochanter Wound Laterality: Left Cleanser: Vashe 5.8 (oz) 1 x Per Day/30 Days Discharge Instructions: Use vashe 5.8 (oz) as directed Prim Dressing: Prisma 4.34 (in) 1 x Per Day/30 Days ary Discharge Instructions: pack into tunnel and wound Secondary Dressing: Gauze 1 x Per Day/30 Days Discharge Instructions: moisten with Hydrogel or surgical lube Secondary Dressing: (BORDER) Zetuvit Plus SILICONE BORDER Dressing 5x5 (in/in) 1 x Per Day/30 Days Discharge Instructions: Please do not put silicone bordered dressings  under wraps. Use non-bordered dressing only. Wound #18 - Metatarsal head first Wound Laterality: Right Cleanser: Normal Saline (Home Health) 1 x Per Day/30 Days Discharge Instructions: Wash your hands with soap and water. Remove old dressing, discard into plastic bag and place into trash. Cleanse the wound with Normal Saline prior to applying a clean dressing using gauze sponges, not tissues or cotton balls. Do not scrub or use excessive force. Pat dry using gauze sponges, not tissue or cotton balls. Prim Dressing: Gauze 1  x Per Day/30 Days ary Discharge Instructions: Vashe damp to dry SADEEL, [name] ([id number]) [id number] Page 6 of 12 Secondary Dressing: (BORDER) Zetuvit Plus SILICONE BORDER Dressing 4x4 (in/in) (Home Health) 1 x Per Day/30 Days Discharge Instructions: Please do not put silicone bordered dressings under wraps. Use non-bordered dressing only. Wound #6 - Calcaneus Wound Laterality: Right Cleanser: Vashe 5.8 (oz) (Home Health) 1 x Per Day/30 Days Discharge Instructions: damp to dry daily over Santyl Secondary Dressing: ABD Pad 5x9 (in/in) 1 x Per Day/30 Days Discharge Instructions: Cover with ABD pad Secondary Dressing: Kerlix 4.5 x 4.1 (in/yd) 1 x Per Day/30 Days Discharge Instructions: Apply Kerlix 4.5 x 4.1 (in/yd) as instructed Secured With: ACE WRAP - 65M ACE Elastic Bandage With VELCRO Brand Closure, 4 (in) 1 x Per Day/30 Days Electronic Signature(s) Signed: 09/18/2023 5:39:21 PM By: [name] Signed: 09/19/2023 11:25:01 AM By: [name] Derry PA-C Entered By: [name] on 09/18/2023 14:01:22 -------------------------------------------------------------------------------- Problem List Details Patient Name: Date of Service: [name], [name] [name] [name]. 09/18/2023 3:30 PM Medical Record Number: [id number] Patient Account Number: [id number] Date of Birth/Sex: Treating RN: 08-18-1941 (82 y.o. Sharyne Peach, Carrie Primary Care Provider: Barbette [name] Other Clinician: Betha [name] Referring Provider: Treating Provider/Extender: [name] Weeks in Treatment: 1 Active Problems ICD-10 Encounter Code Description Active Date MDM Diagnosis L97.822 Non-pressure chronic ulcer of other part of left lower leg with fat layer 09/26/2022 No Yes exposed L97.812 Non-pressure chronic ulcer of other part of right lower leg with fat layer 03/13/2023 No Yes exposed I87.312 Chronic venous hypertension (idiopathic) with ulcer of left lower extremity 09/26/2022 No Yes I89.0 Lymphedema, not elsewhere classified 09/26/2022 No Yes E11.622 Type 2 diabetes mellitus with other skin ulcer 09/26/2022 No Yes L89.610 Pressure ulcer of right heel, unstageable 09/26/2022 No Yes [name], [name] ([id number]) [id number] Page 7 of 12 L89.513 Pressure ulcer of right ankle, stage 3 12/05/2022 No Yes L89.620 Pressure ulcer of left heel, unstageable 12/05/2022 No Yes M86.172 Other acute osteomyelitis, left ankle and foot 10/24/2022 No Yes L89.214 Pressure ulcer of right hip, stage 4 11/21/2022 No Yes L89.220 Pressure ulcer of left hip, unstageable 05/22/2023 No Yes I50.32 Chronic diastolic (congestive) heart failure 09/26/2022 No Yes Z79.84 Long term (current) use of oral hypoglycemic drugs 03/13/2023 No Yes Inactive Problems ICD-10 Code Description Active Date Inactive Date S81.802A Unspecified open wound, left lower leg, initial encounter 05/22/2023 05/22/2023 Resolved Problems ICD-10 Code Description Active Date Resolved Date L89.320 Pressure ulcer of left buttock, unstageable 09/26/2022 09/26/2022 S91.302A Unspecified open wound, left foot, initial encounter 10/24/2022 10/24/2022 Electronic Signature(s) Signed: 09/18/2023 3:59:25 PM By: [name] Derry PA-C Entered By: [name] on 09/18/2023 12:59:25 -------------------------------------------------------------------------------- Progress Note  Details Patient Name: Date of Service: [name], [name] [name] [name]. 09/18/2023 3:30 PM Medical Record Number: [id number] Patient Account Number: [id number] Date of Birth/Sex: Treating RN: [birth year] (82 y.o. [name] Primary Care Provider: Barbette [name] Other Clinician: Betha [name] Referring Provider: Treating Provider/Extender: [name] Weeks in Treatment: [address] Anishka [name] ([id number]) [id number] Page 8 of 12 Subjective Chief Complaint Information obtained from Patient Right heel wound, right hip wound, right ankle wound, left ankle wound, right lower extremity wound, left anterior lower extremity wound, left hip wound History of Present Illness (HPI) 82 year old patient who comes with a referral for bilateral lower extremity edema and a lower extremity ulceration and has been sent by her PCP [name]. I understand the patient was recently put on amoxicillin and doxycycline but could  not tolerate the amoxicillin. doxycycline course was completed. a BNP and EKG was supposed to be normal and the patient did not have any dyspnea. the patient has been on a diuretic. The patient was also prescribed a pair of elastic compression stockings of the 20-30 mmHg pressure variety. x-ray of the right ankle was done on 09/20/2015 and showed posttraumatic and postsurgical changes of the right ankle with secondary degenerative changes of the tibiotalar joint and to a lesser degree the subtalar joint. No definite acute bony abnormalities are noted. Past medical history significant for diabetes mellitus, hypertension, hyperlipidemia, right breast cancer treated with a mastectomy in 2014. She has never smoked. 10/24/2015 -- she had delayed her vascular test because of her husband surgery but she is now ready to get him taken care of. He is also unable to use compression stockings and hence we will need to order her Juzo wraps. 10/31/2015--  was seen by Dr. Wyn [name] on 10/28/2015. She had a left lower extremity arterial duplex done at his office a couple of years ago and that was essentially normal. Today they performed a venous duplex which revealed no evidence of deep vein thrombosis, superficial thrombophlebitis, no venous reflex was seen on the right and a minimal amount of reflux was seen on the left great saphenous vein but no significant reflux was seen. Impression was that there was a component of lymphedema present from a previous surgery and he would recommend compression stockings and leg elevation. 10/2; is a patient we are following every 2 weeks. Apparently seen by Dr. Joylene [name] since she was last here no a day additional antibiotics were added. There was some suggestion about US obtaining cultures and we could bring them to her attention. The patient has stage IV wound over the right greater trochanter and unstageable area on the left but it is probably stage IV as well. She has a small area on the tip of her left heel larger wounds on the left [name] left medial foot, left medial ankle Readmission: 07-24-2022 upon evaluation today patient presents for initial inspection here in our clinic concerning issues that she has been having with her legs this is actually been going on for several years according to what her family member with her today tells me as well as what the patient reiterates as well. She is currently most recently been seeing [name] and subsequently he had her in [place] boots. However 2 weeks ago he referred her to [place] and then subsequently took her out of the [place] boot wraps at that point. At this time the left leg looks to be worse in the right leg currently. She is on Lasix and lisinopril with hydrochlorothiazide she has high blood pressure she also has issues currently with lower extremity swelling and edema which has been an ongoing issue for her as well. Patient does have a history of chronic venous  hypertension, lymphedema, diabetes mellitus type 2, hypertension, peripheral vascular disease, and neuropathy. Currently she is on Lasix as well as lisinopril with hydrochlorothiazide. 08-02-2022 upon evaluation today patient presents for reevaluation the good news is she is actually doing somewhat better in regard to the wound and the overall appearance and sinuses. The unfortunate thing is her infection really is not significantly improved we did have to switch out her antibiotic once we got that final result back and I switched her to Levaquin and away from the doxycycline. Unfortunately the doxycycline had been doing poorly for her. In fact she had had diarrhea  from the time she started taking it on Friday and she is still having it when she shows up today for evaluation. Again I was not aware of this and obviously she does appear to be somewhat dehydrated as well based on what I see. My concern which I discussed with the patient today is the possibility of a C. difficile infection. With that being said fact this started immediately upon taking the doxycycline makes me think that it was just the medicine and is not completely out of her system yet despite having taken the last dose Tuesday morning. Nonetheless with what we are seeing currently I want to be sure that reason I Minna contact her primary care provider and see if a would be willing to see her and test for C. difficile infection. 08-07-2022 upon evaluation today patient appears to be doing well currently in regard to her wounds which are actually measuring much better this is great news. Fortunately I do not see any signs of active infection locally or systemically at this time which is great as well. The good news is she was tested for a C. difficile infection and it was negative. I am very pleased and thankful for primary care provider for doing that so this means that she was just having a severe reaction to the doxycycline we have added  that to her allergy list at this point. 08-14-2022 upon evaluation today patient appears to be doing well currently in regard to her dehydration she is actually significantly improved compared to last time I saw her last week. With that being said I do not see any evidence of active infection systemically at this time which is great news. However locally she still does appear to have cellulitis in left lower extremity. I discussed with her today that I do believe she would benefit from going ahead and starting the Levaquin. Again we will concerned about the C. difficile infection of the diarrhea but that this turned out to be just an issue with a reaction to the doxycycline. My hope is that the Levaquin will not cause her any complication and will be able to treat the infection. I did review her arterial study as well which was dated on 07-31-2022. It showed that she had a ABI on the right of 1.30 and on the left of 1.27 with a TBI on the right of 1.12 and the left 1.21 this is a normal arterial study. Again on the patient's wound culture she actually did show evidence of Proteus, Morganella, and MRSA. Levaquin is a good option here across the board. 09/26/2022 Ms. Diane Emminger is a 82 year old female with a past medical history of type 2 diabetes currently controlled on oral agents, venous insufficiency/lymphedema, right breast cancer and chronic diastolic heart failure that presents to the clinic for a 1 month history of nonhealing wounds to the left lower extremity, right heel and left buttocks. She states that the buttocks wound and the right heel wound developed while she was in the hospital. She was admitted on 08/22/2022 for severe sepsis secondary to acute sigmoid and distal colonic diverticulitis. At that time it was noted she had a stage I decubitus ulcer to her bilateral buttocks. A wound to the heel was not mentioned. She currently denies systemic signs of infection. She came into clinic  in a blue gown with no undergarments. She has not been dressing the wounds. She states she just recently obtained home health and they are coming out for the first  time this week. She is seen in our clinic often for lower extremity wounds secondary to venous insufficiency. 11/22; patient presents for follow-up. Son is present during the encounter. Per son it sounds like they are not doing any dressing changes. She states she has home health but they have not come out. She is going to let us know which home health agency she has been approved for so we can send orders. Currently she denies signs of infection. 12/13; patient presents for follow-up. Patient has home health and they are coming out once a week. It appears that there has not been any dressing changes except for with home health. Patient has a newly discovered wound to the left foot. There is exposed bone. There is slight erythema and increased warmth to the surrounding tissue. Patient is completely unaware of this wound. 12/20; patient presents for follow-up. She has been taking her oral antibiotics. She now has an eschar and wound to the right hip. She has been using Dakin's wet-to-dry dressings to the left foot wound and buttocks wound. She has been using Medihoney and silver alginate to the right heel wound. She currently denies systemic signs of infection. She is mainly bedbound or in a wheelchair. 1/10; patient presents for follow-up. Patient had her left second toenail removed by [name], podiatry on 12/21. Unfortunately she did not feel well and she was admitted to the hospital for sepsis. Her right heel wound was thought to be infected and this was debrided in the OR by [name]. Culture and bone biopsy had no growth. She has been using Medihoney to all the wound beds except for the lefty buttocks wound for which she uses Dakin's wet-to-dry [name], [name] ([id number]) [id number] Page 9 of  12 dressings. She has developed a pressure injury to the left heel now. This is despite having Prevalon boots. Although she is not wearing them today. She has completed 4 weeks of Augmentin and also a week of doxycycline for osteomyelitis of the left foot. Her left foot wound no longer probes to bone. Currently she denies signs of infection. 1/24; patient presents for follow-up. She has been using Medihoney and Hydrofera Blue to the wound beds except for this buttocks wound she has been using Dakin's wet-to-dry dressings. She has developed 2 new wounds 1 to the left heel and the other to the right medial ankle. She claims she is wearing her Prevalon boots all the time although she does not have them on today. She currently denies signs of infection. 2/21; patient presents for follow-up. She was recently hospitalized on 12/19/2022 for sepsis secondary to Enterococcus bacillus bacteremia. She was treated with IV antibiotics and has completed her discharge oral antibiotics. She had a CT scanning of the abdomen/pelvis without acute abnormalities. Husband is present with patient today. They have been using Dakin's wet-to-dry dressings to the sacral wound and hip wound and Hydrofera Blue and Medihoney to the feet wounds. They are not changing the dressings daily. It is unclear how often they actually change the dressings. She has been wearing her Prevalon boots at night but not during the day. She currently denies systemic signs of infection. 3/20; patient presents for follow-up. She again was in the hospital for severe sepsis on 2/24; MRIs at that time showed findings suspicious for osteomyelitis to the right hip. She is currently on Augmentin to complete 4 weeks of this. She is following with ID. She has been using Dakin's wet-to-dry dressings to the sacrum and right hip  however the solution is starting to cause discomfort. T the rest of the wound she has been using Hydrofera Blue and Medihoney.  Patient o and son are not interested in doing palliative care or hospice. They state that they had discussions while in the hospital. 4/17; patient presents for follow-up. She has been using Vashe wet-to-dry dressings to the sacrum and right trochanter wound. She has been using Medihoney and Hydrofera Blue to the bilateral feet wounds. She has no issues or complaints today. She denies signs of infection. 5/1; patient developed a new wound to the right leg. Son was present and states that there was a scab and he removed it creating a wound. He has been keeping the area covered. T the sacral and right trochanter wound they have been using Vashe wet-to-dry dressings and Medihoney and Hydrofera Blue to the o bilateral feet wounds. She denies signs of infection. 6/1; 1 month follow-up. She has a new area on the left anterior lower leg. In the meantime the sacral ulcer is closed. She still has an extensive wound on the right hip with exposed tendon over the greater trochanter. On the lower extremities the left anterior lower leg wound is new right ankle left heel and right heel all look reasonably stable. We are using Vashe wet-to-dry on the hip Medihoney and Hydrofera Blue on the lower extremities. She is accompanied by her son I think who is doing the dressings. They do not have a pressure-relief surface at home but they do have a hospital bed 7/10; I have not seen this patient since 03/13/2023. She was scheduled to be seen on 5/29 however missed her appointment due to illness. She was seen on 6/5 by Dr. Leanord Hawking and she presents today for her follow-up. Unfortunately her wounds have declined since I last saw her. She has several new wounds including one to the left hip wound and one left anterior lower leg. The right hip wound has declined and has maggots with bone exposed. She has been using Vashe wet- to-dry dressings to Right hip wound and Medihoney and Hydrofera Blue to the The remaining wounds. Sacral  wound remains closed. 7/24; patient presents for follow-up. At last clinic visit I recommended she go to the ED for potential admission, IV antibiotics and further imaging. She had MRI of the pelvis and left foot that showed osteomyelitis. Podiatry advised conservative management. She was also seen by infectious disease who discharged patient on 2 weeks of linezolid and 2 weeks of ciprofloxacin. Patient is at home and has home health. Palliative care was consulted during the stay and she is full code and has declined further outpatient palliative care and hospice services.. She currently denies systemic signs of infection. Overall there is improvement in the appearance of the wounds. There is healthier granulation tissue present. However this is overall poor prognosis case. She has Prevalon boots but it is unclear if she is using these. She does not have them on today. She follows up with infectious disease next week. 8/7; Patient's been using Vashe wet-to-dry dressing to all the wound beds except for the left hip she has been using Santyl. She followed up with infectious disease on 8/1. Currently she is on linezolid and ciprofloxacin. They recommended lab work to see if she needs extension of her oral antibiotics. She currently denies systemic signs of infection. 8/21; patient presents for follow-up. She has been using Vashe wet-to-dry dressings to all wounds except the left hip she has been using Santyl. All wounds  have improved in size and appearance. She has healthy granulation tissue present to all wounds except the left hip still has nonviable tissue throughout. She states she has completed her oral antibiotics per ID. She currently denies systemic signs of infection. 9/4; patient presents for follow-up. She has been using Vashe wet-to-dry dressings to all wounds except the left hip. T the left hip she has been using Santyl. o All wounds appear well-healing. She currently denies systemic signs  of infection. 9/18; 2-week follow-up. This is a very disabled woman who is nonambulatory. 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 She has significant bilateral lower extremity pressure ulcers bilaterally. 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 She is not ambulatory. She has extensive wounds on both trochanters. The right has exposed bone in the left nonviable tissue. I aggressively debrided this the last time she was here. We have been using Santyl on the left hip and Vashe wet-to-dry on the right hip. She has wounds on her bilateral lower legs extensively on the right medial foot first met head ankle and lower leg area on the left lower leg and left heel. All of these seem somewhat improved again using Vashe wet-to-dry. We have been attempting to get a level 3 surface or some form of offloading surface for the patient although having trouble with their insurance which apparently is [facility] 10/16; patient is doing very well. The wounds on the lower extremity even the extensive areas seem to be a lot better with healthy granulation. We have been using the [employer] equivalent of Vashe wet-to-dry. There is areas over the lateral part of the greater trochanters. The area on the left looks a lot better we have been using Santyl with backing wet to dry gauze. We have been using Vashe wet-to-dry in the right greater trochanter. I think we are in line with changing the dressing next week to both hip areas We are apparently finally making some inroads in getting her a level 3 surface for her bed 10/23; patient continues to make nice improvement in the bilateral lower leg wounds using the equivalent of Vashe wet-to-dry. I did plan to change this dressing but the wounds are making such a nice improvement I continued it. T oday I have changed the  greater trochanter stage IV pressure ulcers to a Prisma and then hydrogel wet-to-dry packing with border foam change every 2 D. Really remarkable improvement credit to the family this looking after her 09-18-2023 upon evaluation today patient appears to be doing more poorly especially in regard to her heels and lower extremities where she has deep tissue injuries on both heels and she has new blisters that have opened up on her legs. This is due to the fact that she is not elevating her legs she is staying in her wheelchair sitting not moving around throughout the day and this is not the best thing for her to be honest. Fortunately I do not think she is infected right now but at the same time this is still concerning to me. [name], [name] ([id number]) [id number] Page 10 of 12 Objective Constitutional Well-nourished and well-hydrated in no acute distress. Vitals Time Taken: 4:15 PM, Weight: 98 lbs, Temperature: 98.2 F, Pulse: 92 bpm, Respiratory Rate: 18 breaths/min, Blood Pressure: 114/67 mmHg. Respiratory normal breathing without difficulty. Psychiatric this patient is able  to make decisions and demonstrates good insight into disease process. Alert and Oriented x 3. pleasant and cooperative. General Notes: Upon inspection patient's wound bed actually showed signs of poor granulation and epithelization in a lot of areas due to deep tissue injury especially on the heels. Her legs do not look too bad but at the same time several of these ulcers are new just in the past day or so. Her hip ulcers on the right side actually showed signs of being worse the left side looks a little better we are using collagen and then hydrogel with packing and behind. Integumentary (Hair, Skin) Wound #11 status is Open. Original cause of wound was Pressure Injury. The date acquired was: 10/24/2022. The wound has been in treatment 46 weeks. The wound is located on the Right Trochanter.  The wound measures 2.8cm length x 2.8cm width x 1.2cm depth; 6.158cm^2 area and 7.389cm^3 volume. There is bone, muscle, tendon, Fat Layer (Subcutaneous Tissue), and fascia exposed. There is undermining starting at 12:00 and ending at 12:00 with a maximum distance of 3.4cm. There is a medium amount of serosanguineous drainage noted. The wound margin is well defined and not attached to the wound base. There is large (67-100%) red granulation within the wound bed. There is no necrotic tissue within the wound bed. Wound #13 status is Open. Original cause of wound was Pressure Injury. The date acquired was: 11/28/2022. The wound has been in treatment 41 weeks. The wound is located on the Left Calcaneus. The wound measures 1cm length x 1cm width x 0.1cm depth; 0.785cm^2 area and 0.079cm^3 volume. There is Fat Layer (Subcutaneous Tissue) exposed. There is a medium amount of serosanguineous drainage noted. The wound margin is flat and intact. There is medium (34-66%) pink granulation within the wound bed. There is a medium (34-66%) amount of necrotic tissue within the wound bed including Eschar and Adherent Slough. Wound #14 status is Open. Original cause of wound was Gradually Appeared. The date acquired was: 03/10/2023. The wound has been in treatment 27 weeks. The wound is located on the Right,Distal,Medial Lower Leg. The wound measures 1cm length x 1.5cm width x 0.1cm depth; 1.178cm^2 area and 0.118cm^3 volume. There is Fat Layer (Subcutaneous Tissue) exposed. There is a large amount of serosanguineous drainage noted. There is large (67-100%) red granulation within the wound bed. There is a small (1-33%) amount of necrotic tissue within the wound bed including Adherent Slough. Wound #15 status is Open. Original cause of wound was Pressure Injury. The date acquired was: 04/11/2023. The wound has been in treatment 22 weeks. The wound is located on the Left,Anterior Lower Leg. The wound measures 3.5cm length x  2.5cm width x 0.1cm depth; 6.872cm^2 area and 0.687cm^3 volume. There is tendon and Fat Layer (Subcutaneous Tissue) exposed. There is a none present amount of drainage noted. There is no granulation within the wound bed. There is no necrotic tissue within the wound bed. Wound #16 status is Open. Original cause of wound was Pressure Injury. The date acquired was: 05/22/2023. The wound has been in treatment 17 weeks. The wound is located on the Left Trochanter. The wound measures 1cm length x 2.4cm width x 0.8cm depth; 1.885cm^2 area and 1.508cm^3 volume. There is Fat Layer (Subcutaneous Tissue) exposed. There is undermining starting at 7:00 and ending at 5:00 with a maximum distance of 1.6cm. There is a medium amount of serosanguineous drainage noted. There is a large (67-100%) amount of necrotic tissue within the wound bed including Eschar and  Adherent Slough. Wound #18 status is Open. Original cause of wound was Pressure Injury. The date acquired was: 07/31/2023. The wound has been in treatment 7 weeks. The wound is located on the Right Metatarsal head first. The wound measures 1cm length x 0.7cm width x 0.1cm depth; 0.55cm^2 area and 0.055cm^3 volume. There is Fat Layer (Subcutaneous Tissue) exposed. There is a medium amount of serosanguineous drainage noted. There is medium (34-66%) red granulation within the wound bed. There is a medium (34-66%) amount of necrotic tissue within the wound bed including Eschar and Adherent Slough. Wound #19 status is Open. Original cause of wound was Blister. The date acquired was: 09/17/2023. The wound is located on the Right,Anterior Lower Leg. The wound measures 4.5cm length x 2cm width x 0.1cm depth; 7.069cm^2 area and 0.707cm^3 volume. There is Fat Layer (Subcutaneous Tissue) exposed. There is no tunneling or undermining noted. There is a medium amount of serosanguineous drainage noted. Wound #6 status is Open. Original cause of wound was Pressure Injury. The date  acquired was: 07/23/2022. The wound has been in treatment 51 weeks. The wound is located on the Right Calcaneus. The wound measures 2cm length x 2cm width x 0.1cm depth; 3.142cm^2 area and 0.314cm^3 volume. There is Fat Layer (Subcutaneous Tissue) exposed. There is a medium amount of serosanguineous drainage noted. The wound margin is distinct with the outline attached to the wound base. There is medium (34-66%) red, pink granulation within the wound bed. There is a medium (34-66%) amount of necrotic tissue within the wound bed including Eschar and Adherent Slough. Assessment Active Problems ICD-10 Non-pressure chronic ulcer of other part of left lower leg with fat layer exposed Non-pressure chronic ulcer of other part of right lower leg with fat layer exposed Chronic venous hypertension (idiopathic) with ulcer of left lower extremity Lymphedema, not elsewhere classified Type 2 diabetes mellitus with other skin ulcer Pressure ulcer of right heel, unstageable Pressure ulcer of right ankle, stage 3 Pressure ulcer of left heel, unstageable Other acute osteomyelitis, left ankle and foot Pressure ulcer of right hip, stage 4 Pressure ulcer of left hip, unstageable Chronic diastolic (congestive) heart failure Long term (current) use of oral hypoglycemic drugs [name], [name] ([id number]) [id number] Page 11 of 12 Plan Follow-up Appointments: Return Appointment in 2 weeks. Home Health: Home Health Company: - Harlin Rain, RN [phone] [facility] for wound care. May utilize formulary equivalent dressing for wound treatment orders unless otherwise specified. Home Health Nurse may visit PRN to address patients wound care needs. - left and right Trochanter: Prisma packed into wound then Hydrogel or surgical lube covered gauze over prisma all other wounds- vashe damp to dry daily Off-Loading: Air fluidized (Group 3) WOUND #11: - Trochanter Wound  Laterality: Right Cleanser: Vashe 5.8 (oz) 1 x Per Day/30 Days Discharge Instructions: Use as directed for cleaning wound Prim Dressing: Prisma 4.34 (in) 1 x Per Day/30 Days ary Discharge Instructions: pack into tunnel and wound Secondary Dressing: Gauze 1 x Per Day/30 Days Discharge Instructions: moisten with Hydrogel or surgical lube Secondary Dressing: (BORDER) Zetuvit Plus SILICONE BORDER Dressing 5x5 (in/in) 1 x Per Day/30 Days Discharge Instructions: Please do not put silicone bordered dressings under wraps. Use non-bordered dressing only. WOUND #13: - Calcaneus Wound Laterality: Left Cleanser: Vashe 5.8 (oz) (Home Health) 1 x Per Day/30 Days Discharge Instructions: damp to dry daily Secondary Dressing: ABD Pad 5x9 (in/in) 1 x Per Day/30 Days Discharge Instructions: Cover with ABD pad Secondary Dressing: Kerlix 4.5 x 4.1 (in/yd)  1 x Per Day/30 Days Discharge Instructions: Apply Kerlix 4.5 x 4.1 (in/yd) as instructed Secured With: ACE WRAP - 8M ACE Elastic Bandage With VELCRO Brand Closure, 4 (in) 1 x Per Day/30 Days WOUND #14: - Lower Leg Wound Laterality: Right, Medial, Distal Cleanser: Vashe 5.8 (oz) (Home Health) 1 x Per Day/30 Days Discharge Instructions: damp to dry daily Secondary Dressing: ABD Pad 5x9 (in/in) 1 x Per Day/30 Days Discharge Instructions: Cover with ABD pad Secondary Dressing: Kerlix 4.5 x 4.1 (in/yd) 1 x Per Day/30 Days Discharge Instructions: Apply Kerlix 4.5 x 4.1 (in/yd) as instructed Secured With: ACE WRAP - 8M ACE Elastic Bandage With VELCRO Brand Closure, 4 (in) 1 x Per Day/30 Days WOUND #15: - Lower Leg Wound Laterality: Left, Anterior Cleanser: Vashe 5.8 (oz) (Home Health) 1 x Per Day/30 Days Discharge Instructions: damp to dry daily Secondary Dressing: ABD Pad 5x9 (in/in) 1 x Per Day/30 Days Discharge Instructions: Cover with ABD pad Secondary Dressing: Kerlix 4.5 x 4.1 (in/yd) 1 x Per Day/30 Days Discharge Instructions: Apply Kerlix 4.5 x 4.1  (in/yd) as instructed Secured With: ACE WRAP - 8M ACE Elastic Bandage With VELCRO Brand Closure, 4 (in) 1 x Per Day/30 Days WOUND #16: - Trochanter Wound Laterality: Left Cleanser: Vashe 5.8 (oz) 1 x Per Day/30 Days Discharge Instructions: Use vashe 5.8 (oz) as directed Prim Dressing: Prisma 4.34 (in) 1 x Per Day/30 Days ary Discharge Instructions: pack into tunnel and wound Secondary Dressing: Gauze 1 x Per Day/30 Days Discharge Instructions: moisten with Hydrogel or surgical lube Secondary Dressing: (BORDER) Zetuvit Plus SILICONE BORDER Dressing 5x5 (in/in) 1 x Per Day/30 Days Discharge Instructions: Please do not put silicone bordered dressings under wraps. Use non-bordered dressing only. WOUND #18: - Metatarsal head first Wound Laterality: Right Cleanser: Normal Saline (Home Health) 1 x Per Day/30 Days Discharge Instructions: Wash your hands with soap and water. Remove old dressing, discard into plastic bag and place into trash. Cleanse the wound with Normal Saline prior to applying a clean dressing using gauze sponges, not tissues or cotton balls. Do not scrub or use excessive force. Pat dry using gauze sponges, not tissue or cotton balls. Prim Dressing: Gauze 1 x Per Day/30 Days ary Discharge Instructions: Vashe damp to dry Secondary Dressing: (BORDER) Zetuvit Plus SILICONE BORDER Dressing 4x4 (in/in) (Home Health) 1 x Per Day/30 Days Discharge Instructions: Please do not put silicone bordered dressings under wraps. Use non-bordered dressing only. WOUND #6: - Calcaneus Wound Laterality: Right Cleanser: Vashe 5.8 (oz) (Home Health) 1 x Per Day/30 Days Discharge Instructions: damp to dry daily over Santyl Secondary Dressing: ABD Pad 5x9 (in/in) 1 x Per Day/30 Days Discharge Instructions: Cover with ABD pad Secondary Dressing: Kerlix 4.5 x 4.1 (in/yd) 1 x Per Day/30 Days Discharge Instructions: Apply Kerlix 4.5 x 4.1 (in/yd) as instructed Secured With: ACE WRAP - 8M ACE Elastic  Bandage With VELCRO Brand Closure, 4 (in) 1 x Per Day/30 Days 1. Based on what I am seeing I do believe that the patient should continue with appropriate offloading I think this is the best thing she can do to help herself. She inquired about an antibiotic but I told her that that was not something that was going to help there is no signs of infection at the moment. 2. Based on what I am seeing I do believe that the patient needs to be more careful with her wounds apparently she will get some better and then she starts to worsen again. This is something  that needs to be monitored day and day out she needs to make sure that she is not staying in any 1 position longer than 2 hours and she does not need to be in her wheelchair longer than 2 hours at a time. I discussed that with the patient and her son today. We will see patient back for reevaluation in 1 week here in the clinic. If anything worsens or changes patient will contact our office for additional recommendations. Electronic Signature(s) Signed: 09/18/2023 5:06:47 PM By: [name] Derry PA-C Entered By: [name] on 09/18/2023 14:06:47 [name], [name] ([id number]) [id number] Page 12 of 12 -------------------------------------------------------------------------------- SuperBill Details Patient Name: Date of Service: [name], [name] [name] [name]. 09/18/2023 Medical Record Number: [id number] Patient Account Number: [id number] Date of Birth/Sex: Treating RN: May 09, 1941 (82 y.o. [name] Primary Care Provider: Barbette [name] Other Clinician: Betha [name] Referring Provider: Treating Provider/Extender: [name] Weeks in Treatment: 31 Diagnosis Coding ICD-10 Codes Code Description [phone] Non-pressure chronic ulcer of other part of left lower leg with fat layer exposed L97.812 Non-pressure chronic ulcer of other part of right lower leg with fat layer exposed I87.312 Chronic venous  hypertension (idiopathic) with ulcer of left lower extremity I89.0 Lymphedema, not elsewhere classified E11.622 Type 2 diabetes mellitus with other skin ulcer L89.610 Pressure ulcer of right heel, unstageable L89.513 Pressure ulcer of right ankle, stage 3 L89.620 Pressure ulcer of left heel, unstageable M86.172 Other acute osteomyelitis, left ankle and foot L89.214 Pressure ulcer of right hip, stage 4 L89.220 Pressure ulcer of left hip, unstageable I50.32 Chronic diastolic (congestive) heart failure Z79.84 Long term (current) use of oral hypoglycemic drugs Facility Procedures : CPT4 Code: [id number] Description: 88416 - WOUND CARE VISIT-LEV 5 EST PT Modifier: Quantity: 1 Physician Procedures : CPT4 Code Description Modifier [id number] - WC PHYS LEVEL 3 - EST PT ICD-10 Diagnosis Description L97.822 Non-pressure chronic ulcer of other part of left lower leg with fat layer exposed L97.812 Non-pressure chronic ulcer of other part of right  lower leg with fat layer exposed I87.312 Chronic venous hypertension (idiopathic) with ulcer of left lower extremity I89.0 Lymphedema, not elsewhere classified Quantity: 1 Electronic Signature(s) Signed: 09/18/2023 5:09:16 PM By: [name] Derry PA-C Entered By: [name] on 09/18/2023 14:09:15

## 2023-09-18 NOTE — Progress Notes (Addendum)
[name], [name] ([id number]) [id number] Page 1 of 16 Visit Report for 09/18/2023 Arrival Information Details Patient Name: Date of Service: [facility], [name]. 09/18/2023 3:30 PM Medical Record Number: [id number] Patient Account Number: [id number] Date of Birth/Sex: Treating RN: 1941/10/28 (82 y.o. Female) Yevonne Pax Primary Care Sharif Rendell: Barbette Reichmann Other Clinician: Betha Loa Referring Garima Chronis: Treating Anastasio Wogan/Extender: Jearld Shines Weeks in Treatment: 36 Visit Information History Since Last Visit All ordered tests and consults were completed: No Patient Arrived: Wheel Chair Added or deleted any medications: No Arrival Time: 16:32 Any new allergies or adverse reactions: No Transfer Assistance: EasyPivot Patient Lift Had a fall or experienced change in No Patient Identification Verified: Yes activities of daily living that may affect Secondary Verification Process Completed: Yes risk of falls: Patient Requires Transmission-Based Precautions: No Signs or symptoms of abuse/neglect since last visito No Patient Has Alerts: Yes Hospitalized since last visit: No Patient Alerts: DM II Implantable device outside of the clinic excluding No ABI R 1.30 TBI 1.12 cellular tissue based products placed in the center ABI [name] 1.27 TBI 1.21 since last visit: Has Dressing in Place as Prescribed: Yes Pain Present Now: No Electronic Signature(s) Signed: 09/18/2023 5:39:21 PM By: Betha Loa Entered By: Betha Loa on 09/18/2023 16:33:08 -------------------------------------------------------------------------------- Clinic Level of Care Assessment Details Patient Name: Date of Service: [facility], [name] 09/18/2023 3:30 PM Medical Record Number: [id number] Patient Account Number: [id number] Date of Birth/Sex: Treating RN: Feb 24, 1941 (82 y.o. Female) Yevonne Pax Primary Care Jurney Overacker: Barbette Reichmann Other Clinician:  Betha Loa Referring Wael Maestas: Treating Xaria Judon/Extender: Jearld Shines Weeks in Treatment: 18 Clinic Level of Care Assessment Items TOOL 4 Quantity Score []  - 0 Use when only an EandM is performed on FOLLOW-UP visit ASSESSMENTS - Nursing Assessment / Reassessment X- 1 10 Reassessment of Co-morbidities (includes updates in patient status) X- 1 5 Reassessment of Adherence to Treatment Plan [name], [name] ([id number]) [id number] Page 2 of 16 ASSESSMENTS - Wound and Skin A ssessment / Reassessment []  - 0 Simple Wound Assessment / Reassessment - one wound X- 8 5 Complex Wound Assessment / Reassessment - multiple wounds []  - 0 Dermatologic / Skin Assessment (not related to wound area) ASSESSMENTS - Focused Assessment []  - 0 Circumferential Edema Measurements - multi extremities []  - 0 Nutritional Assessment / Counseling / Intervention []  - 0 Lower Extremity Assessment (monofilament, tuning fork, pulses) []  - 0 Peripheral Arterial Disease Assessment (using hand held doppler) ASSESSMENTS - Ostomy and/or Continence Assessment and Care []  - 0 Incontinence Assessment and Management []  - 0 Ostomy Care Assessment and Management (repouching, etc.) PROCESS - Coordination of Care X - Simple Patient / Family Education for ongoing care 1 15 []  - 0 Complex (extensive) Patient / Family Education for ongoing care []  - 0 Staff obtains Chiropractor, Records, T Results / Process Orders est []  - 0 Staff telephones HHA, Nursing Homes / Clarify orders / etc []  - 0 Routine Transfer to another Facility (non-emergent condition) []  - 0 Routine Hospital Admission (non-emergent condition) []  - 0 New Admissions / Manufacturing engineer / Ordering NPWT Apligraf, etc. , []  - 0 Emergency Hospital Admission (emergent condition) X- 1 10 Simple Discharge Coordination []  - 0 Complex (extensive) Discharge Coordination PROCESS - Special Needs []  -  0 Pediatric / Minor Patient Management []  - 0 Isolation Patient Management []  - 0 Hearing / Language / Visual special needs []  - 0 Assessment of Community assistance (transportation, D/C planning, etc.) []  - 0 Additional  assistance / Altered mentation []  - 0 Support Surface(s) Assessment (bed, cushion, seat, etc.) INTERVENTIONS - Wound Cleansing / Measurement []  - 0 Simple Wound Cleansing - one wound X- 8 5 Complex Wound Cleansing - multiple wounds X- 1 5 Wound Imaging (photographs - any number of wounds) []  - 0 Wound Tracing (instead of photographs) []  - 0 Simple Wound Measurement - one wound X- 8 5 Complex Wound Measurement - multiple wounds INTERVENTIONS - Wound Dressings []  - 0 Small Wound Dressing one or multiple wounds X- 8 15 Medium Wound Dressing one or multiple wounds []  - 0 Large Wound Dressing one or multiple wounds []  - 0 Application of Medications - topical []  - 0 Application of Medications - injection INTERVENTIONS - Miscellaneous []  - 0 External ear exam Swallow, [name] ([id number]) [id number] Page 3 of 16 []  - 0 Specimen Collection (cultures, biopsies, blood, body fluids, etc.) []  - 0 Specimen(s) / Culture(s) sent or taken to Lab for analysis []  - 0 Patient Transfer (multiple staff / Michiel Sites Lift / Similar devices) []  - 0 Simple Staple / Suture removal (25 or less) []  - 0 Complex Staple / Suture removal (26 or more) []  - 0 Hypo / Hyperglycemic Management (close monitor of Blood Glucose) []  - 0 Ankle / Brachial Index (ABI) - do not check if billed separately X- 1 5 Vital Signs Has the patient been seen at the hospital within the last three years: Yes Total Score: 290 Level Of Care: New/Established - Level 5 Electronic Signature(s) Signed: 09/18/2023 5:39:21 PM By: Betha Loa Entered By: Betha Loa on 09/18/2023 17:02:01 -------------------------------------------------------------------------------- Lower  Extremity Assessment Details Patient Name: Date of Service: [facility], [name] 09/18/2023 3:30 PM Medical Record Number: [id number] Patient Account Number: [id number] Date of Birth/Sex: Treating RN: [birth year] (82 y.o. Female) Yevonne Pax Primary Care Joncarlos Atkison: Barbette Reichmann Other Clinician: Betha Loa Referring Brittyn Salaz: Treating Jameriah Trotti/Extender: Karle Plumber, Vishwanath Weeks in Treatment: 43 Electronic Signature(s) Signed: 09/18/2023 5:39:21 PM By: Betha Loa Signed: 09/25/2023 4:43:41 PM By: Yevonne Pax RN Entered By: Betha Loa on 09/18/2023 16:44:28 -------------------------------------------------------------------------------- Multi Wound Chart Details Patient Name: Date of Service: [facility], [name] 09/18/2023 3:30 PM Medical Record Number: [id number] Patient Account Number: [id number] Date of Birth/Sex: Treating RN: Mar 18, 1941 (82 y.o. Female) Yevonne Pax Primary Care Teryl Mcconaghy: Barbette Reichmann Other Clinician: Betha Loa Referring Uriel Horkey: Treating Tanmay Halteman/Extender: Karle Plumber, Vishwanath Weeks in Treatment: 66 Vital Signs Height(in): Pulse(bpm): 92 [name], [name] ([id number]) [id number] Page 4 of 16 Weight(lbs): 98 Blood Pressure(mmHg): 114/67 Body Mass Index(BMI): Temperature(F): 98.2 Respiratory Rate(breaths/min): 18 [11:Photos:] Right Trochanter Left Calcaneus Right, Distal, Medial Lower Leg Wound Location: Pressure Injury Pressure Injury Gradually Appeared Wounding Event: Pressure Ulcer Pressure Ulcer Diabetic Wound/Ulcer of the Lower Primary Etiology: Extremity Cataracts, Lymphedema, Cataracts, Lymphedema, Cataracts, Lymphedema, Comorbid History: Hypertension, Peripheral Arterial Hypertension, Peripheral Arterial Hypertension, Peripheral Arterial Disease, Peripheral Venous Disease, Disease, Peripheral Venous Disease, Disease, Peripheral Venous Disease, Type II Diabetes,  Osteoarthritis, Type II Diabetes, Osteoarthritis, Type II Diabetes, Osteoarthritis, Neuropathy Neuropathy Neuropathy 10/24/2022 11/28/2022 03/10/2023 Date Acquired: 46 41 27 Weeks of Treatment: Open Open Open Wound Status: No No No Wound Recurrence: No No Yes Clustered Wound: 2.8x2.8x1.2 1x1x0.1 1x1.5x0.1 Measurements [name] x W x D (cm) 6.158 0.785 1.178 A (cm) : rea 7.389 0.079 0.118 Volume (cm) : -197.10% 77.30% 78.60% % Reduction in A rea: -3469.60% 77.20% 78.50% % Reduction in Volume: 12 Starting Position 1 (o'clock): 12 Ending Position 1 (o'clock): 3.4 Maximum Distance 1 (cm): Yes N/A  N/A Undermining: Category/Stage IV Category/Stage III Grade 2 Classification: Medium Medium Large Exudate A mount: Serosanguineous Serosanguineous Serosanguineous Exudate Type: red, brown red, brown red, brown Exudate Color: Well defined, not attached Flat and Intact N/A Wound Margin: Large (67-100%) Medium (34-66%) Large (67-100%) Granulation A mount: Red Pink Red Granulation Quality: None Present (0%) Medium (34-66%) Small (1-33%) Necrotic A mount: N/A Eschar, Adherent Slough Adherent Slough Necrotic Tissue: Fascia: Yes Fat Layer (Subcutaneous Tissue): Yes Fat Layer (Subcutaneous Tissue): Yes Exposed Structures: Fat Layer (Subcutaneous Tissue): Yes Fascia: No Fascia: No Tendon: Yes Tendon: No Tendon: No Muscle: Yes Muscle: No Muscle: No Bone: Yes Joint: No Joint: No Joint: No Bone: No Bone: No None None None Epithelialization: Wound Number: [id number]  Photos: Left, Anterior Lower Leg Left Trochanter Right Metatarsal head first Wound Location: Pressure Injury Pressure Injury Pressure Injury Wounding Event: Pressure Ulcer Pressure Ulcer Pressure Ulcer Primary Etiology: Cataracts, Lymphedema, Cataracts, Lymphedema, Cataracts, Lymphedema, Comorbid History: Hypertension, Peripheral Arterial Hypertension, Peripheral Arterial Hypertension, Peripheral  Arterial Disease, Peripheral Venous Disease, Disease, Peripheral Venous Disease, Disease, Peripheral Venous Disease, Type II Diabetes, Osteoarthritis, Type II Diabetes, Osteoarthritis, Type II Diabetes, Osteoarthritis, Neuropathy Neuropathy Neuropathy 04/11/2023 05/22/2023 07/31/2023 Date Acquired: 22 17 7  Weeks of Treatment: Open Open Open Wound Status: No No No Wound Recurrence: No No No Clustered Wound: 3.5x2.5x0.1 1x2.4x0.8 1x0.7x0.1 Measurements [name] x W x D (cm) 6.872 1.885 0.55 A (cm) : [name], [name] ([id number]) [id number] Page 5 of 16 0.687 1.508 0.055 Volume (cm) : 69.40% 42.00% 26.30% % Reduction in A rea: 69.40% -364.00% 26.70% % Reduction in Volume: 7 Starting Position 1 (o'clock): 5 Ending Position 1 (o'clock): 1.6 Maximum Distance 1 (cm): N/A Yes N/A Undermining: Category/Stage IV Unstageable/Unclassified Category/Stage II Classification: None Present Medium Medium Exudate A mount: N/A Serosanguineous Serosanguineous Exudate Type: N/A red, brown red, brown Exudate Color: N/A N/A N/A Wound Margin: None Present (0%) N/A Medium (34-66%) Granulation A mount: N/A N/A Red Granulation Quality: None Present (0%) N/A Medium (34-66%) Necrotic A mount: N/A Eschar, Adherent Slough Eschar, Adherent Slough Necrotic Tissue: Fat Layer (Subcutaneous Tissue): Yes Fat Layer (Subcutaneous Tissue): Yes Fat Layer (Subcutaneous Tissue): Yes Exposed Structures: Tendon: Yes Fascia: No Fascia: No Fascia: No Tendon: No Tendon: No Muscle: No Muscle: No Muscle: No Joint: No Joint: No Joint: No Bone: No Bone: No Bone: No Large (67-100%) N/A None Epithelialization: Wound Number: [id number] N/A Photos: N/A Right, Anterior Lower Leg Right Calcaneus N/A Wound Location: Blister Pressure Injury N/A Wounding Event: Venous Leg Ulcer Pressure Ulcer N/A Primary Etiology: Cataracts, Lymphedema, Cataracts, Lymphedema, N/A Comorbid  History: Hypertension, Peripheral Arterial Hypertension, Peripheral Arterial Disease, Peripheral Venous Disease, Disease, Peripheral Venous Disease, Type II Diabetes, Osteoarthritis, Type II Diabetes, Osteoarthritis, Neuropathy Neuropathy 09/17/2023 07/23/2022 N/A Date Acquired: 0 51 N/A Weeks of Treatment: Open Open N/A Wound Status: No No N/A Wound Recurrence: Yes No N/A Clustered Wound: 4.5x2x0.1 2x2x0.1 N/A Measurements [name] x W x D (cm) 7.069 3.142 N/A A (cm) : rea 0.707 0.314 N/A Volume (cm) : N/A 66.70% N/A % Reduction in Area: N/A 88.90% N/A % Reduction in Volume: No N/A N/A Undermining: Full Thickness Without Exposed Category/Stage III N/A Classification: Support Structures Medium Medium N/A Exudate A mount: Serosanguineous Serosanguineous N/A Exudate Type: red, brown red, brown N/A Exudate Color: N/A Distinct, outline attached N/A Wound Margin: N/A Medium (34-66%) N/A Granulation Amount: N/A Red, Pink N/A Granulation Quality: N/A Medium (34-66%) N/A Necrotic Amount: N/A Eschar, Adherent Slough N/A Necrotic Tissue: Fat Layer (Subcutaneous Tissue): Yes  Fat Layer (Subcutaneous Tissue): Yes N/A Exposed Structures: Fascia: No Fascia: No Tendon: No Tendon: No Muscle: No Muscle: No Joint: No Joint: No Bone: No Bone: No None None N/A Epithelialization: Treatment Notes Electronic Signature(s) Signed: 09/18/2023 5:39:21 PM By: Betha Loa Entered By: Betha Loa on 09/18/2023 16:44:32 [name], [name] ([id number]) [id number] Page 6 of 16 -------------------------------------------------------------------------------- Multi-Disciplinary Care Plan Details Patient Name: Date of Service: [facility], [name]. 09/18/2023 3:30 PM Medical Record Number: [id number] Patient Account Number: [id number] Date of Birth/Sex: Treating RN: 1941/04/16 (82 y.o. Female) Yevonne Pax Primary Care Jaidence Geisler: Barbette Reichmann Other  Clinician: Betha Loa Referring Jacolyn Joaquin: Treating Zollie Ellery/Extender: Karle Plumber, Vishwanath Weeks in Treatment: 89 Active Inactive Electronic Signature(s) Signed: 09/18/2023 5:39:21 PM By: Betha Loa Signed: 09/25/2023 4:43:41 PM By: Yevonne Pax RN Entered By: Betha Loa on 09/18/2023 17:02:12 -------------------------------------------------------------------------------- Pain Assessment Details Patient Name: Date of Service: [facility], [name] 09/18/2023 3:30 PM Medical Record Number: [id number] Patient Account Number: [id number] Date of Birth/Sex: Treating RN: 29-Aug-1941 (82 y.o. Female) Yevonne Pax Primary Care Rhylei Mcquaig: Barbette Reichmann Other Clinician: Betha Loa Referring Squire Withey: Treating Owens Hara/Extender: Jearld Shines Weeks in Treatment: 38 Active Problems Location of Pain Severity and Description of Pain Patient Has Paino No Site Locations Omao, Sumner [name] ([id number]) [id number] Page 7 of 16 Pain Management and Medication Current Pain Management: Electronic Signature(s) Signed: 09/18/2023 5:39:21 PM By: Betha Loa Signed: 09/25/2023 4:43:41 PM By: Yevonne Pax RN Entered By: Betha Loa on 09/18/2023 16:33:55 -------------------------------------------------------------------------------- Patient/Caregiver Education Details Patient Name: Date of Service: [facility], [name] 11/6/2024andnbsp3:30 PM Medical Record Number: [id number] Patient Account Number: [id number] Date of Birth/Gender: Treating RN: Feb 27, 1941 (82 y.o. Female) Yevonne Pax Primary Care Physician: Barbette Reichmann Other Clinician: Betha Loa Referring Physician: Treating Physician/Extender: Jearld Shines Weeks in Treatment: 30 Education Assessment Education Provided To: Patient Education Topics Provided Pressure: Handouts: Pressure Injury: Prevention and Offloading Wound/Skin  Impairment: Handouts: Other: continue wound care as directed Methods: Explain/Verbal Responses: State content correctly Electronic Signature(s) Signed: 09/18/2023 5:39:21 PM By: Betha Loa Entered By: Betha Loa on 09/18/2023 17:03:15 -------------------------------------------------------------------------------- Wound Assessment Details Patient Name: Date of Service: [facility], [name] 09/18/2023 3:30 PM Medical Record Number: [id number] Patient Account Number: [id number] Date of Birth/Sex: Treating RN: 1941/09/19 (82 y.o. Female) Yevonne Pax Primary Care Arita Severtson: Barbette Reichmann Other Clinician: Betha Loa Referring Alsace Dowd: Treating Jeneane Pieczynski/Extender: Jearld Shines Weeks in Treatment: [address], [place] [name] ([id number]) [id number] Page 8 of 16 Wound Status Wound Number: 11 Primary Pressure Ulcer Etiology: Wound Location: Right Trochanter Wound Open Wounding Event: Pressure Injury Status: Date Acquired: 10/24/2022 Comorbid Cataracts, Lymphedema, Hypertension, Peripheral Arterial Disease, Weeks Of Treatment: 46 History: Peripheral Venous Disease, Type II Diabetes, Osteoarthritis, Clustered Wound: No Neuropathy Photos Wound Measurements Length: (cm) 2.8 Width: (cm) 2.8 Depth: (cm) 1.2 Area: (cm) 6.158 Volume: (cm) 7.389 % Reduction in Area: -197.1% % Reduction in Volume: -3469.6% Epithelialization: None Undermining: Yes Starting Position (o'clock): 12 Ending Position (o'clock): 12 Maximum Distance: (cm) 3.4 Wound Description Classification: Category/Stage IV Wound Margin: Well defined, not attached Exudate Amount: Medium Exudate Type: Serosanguineous Exudate Color: red, brown Foul Odor After Cleansing: No Slough/Fibrino Yes Wound Bed Granulation Amount: Large (67-100%) Exposed Structure Granulation Quality: Red Fascia Exposed: Yes Necrotic Amount: None Present (0%) Fat Layer (Subcutaneous Tissue)  Exposed: Yes Tendon Exposed: Yes Muscle Exposed: Yes Necrosis of Muscle: No Joint Exposed: No Bone Exposed: Yes Electronic Signature(s) Signed: 09/18/2023 5:39:21 PM By: Betha Loa Signed: 09/25/2023 4:43:41 PM  By: Yevonne Pax RN Entered By: Betha Loa on 09/18/2023 16:36:01 -------------------------------------------------------------------------------- Wound Assessment Details Patient Name: Date of Service: [facility], [name] 09/18/2023 3:30 PM Medical Record Number: [id number] Patient Account Number: [id number] Date of Birth/Sex: Treating RN: 13-Sep-1941 (82 y.o. Female) [name], [name] ([id number]) [id number] Page 9 of 16 Primary Care Bre Pecina: Barbette Reichmann Other Clinician: Betha Loa Referring Makalynn Berwanger: Treating Reyna Lorenzi/Extender: Jearld Shines Weeks in Treatment: 50 Wound Status Wound Number: 13 Primary Pressure Ulcer Etiology: Wound Location: Left Calcaneus Wound Open Wounding Event: Pressure Injury Status: Date Acquired: 11/28/2022 Comorbid Cataracts, Lymphedema, Hypertension, Peripheral Arterial Disease, Weeks Of Treatment: 41 History: Peripheral Venous Disease, Type II Diabetes, Osteoarthritis, Clustered Wound: No Neuropathy Photos Wound Measurements Length: (cm) 1 Width: (cm) 1 Depth: (cm) 0.1 Area: (cm) 0.785 Volume: (cm) 0.079 % Reduction in Area: 77.3% % Reduction in Volume: 77.2% Epithelialization: None Wound Description Classification: Category/Stage III Wound Margin: Flat and Intact Exudate Amount: Medium Exudate Type: Serosanguineous Exudate Color: red, brown Foul Odor After Cleansing: No Slough/Fibrino Yes Wound Bed Granulation Amount: Medium (34-66%) Exposed Structure Granulation Quality: Pink Fascia Exposed: No Necrotic Amount: Medium (34-66%) Fat Layer (Subcutaneous Tissue) Exposed: Yes Necrotic Quality: Eschar, Adherent Slough Tendon Exposed: No Muscle  Exposed: No Joint Exposed: No Bone Exposed: No Electronic Signature(s) Signed: 09/18/2023 5:39:21 PM By: Betha Loa Signed: 09/25/2023 4:43:41 PM By: Yevonne Pax RN Entered By: Betha Loa on 09/18/2023 16:37:01 -------------------------------------------------------------------------------- Wound Assessment Details Patient Name: Date of Service: [facility], [name] 09/18/2023 3:30 PM Medical Record Number: [id number] Patient Account Number: [id number] Date of Birth/Sex: Treating RN: [birth year] (82 y.o. Female) [name], [name] ([id number]) [id number] Page 10 of 16 Primary Care Batu Cassin: Barbette Reichmann Other Clinician: Betha Loa Referring Antaniya Venuti: Treating Rieley Hausman/Extender: Karle Plumber, Vishwanath Weeks in Treatment: 51 Wound Status Wound Number: 14 Primary Diabetic Wound/Ulcer of the Lower Extremity Etiology: Wound Location: Right, Distal, Medial Lower Leg Wound Open Wounding Event: Gradually Appeared Status: Date Acquired: 03/10/2023 Comorbid Cataracts, Lymphedema, Hypertension, Peripheral Arterial Disease, Weeks Of Treatment: 27 History: Peripheral Venous Disease, Type II Diabetes, Osteoarthritis, Clustered Wound: Yes Neuropathy Photos Wound Measurements Length: (cm) 1 Width: (cm) 1.5 Depth: (cm) 0.1 Area: (cm) 1.178 Volume: (cm) 0.118 % Reduction in Area: 78.6% % Reduction in Volume: 78.5% Epithelialization: None Wound Description Classification: Grade 2 Exudate Amount: Large Exudate Type: Serosanguineous Exudate Color: red, brown Foul Odor After Cleansing: No Slough/Fibrino Yes Wound Bed Granulation Amount: Large (67-100%) Exposed Structure Granulation Quality: Red Fascia Exposed: No Necrotic Amount: Small (1-33%) Fat Layer (Subcutaneous Tissue) Exposed: Yes Necrotic Quality: Adherent Slough Tendon Exposed: No Muscle Exposed: No Joint Exposed: No Bone Exposed: No Electronic  Signature(s) Signed: 09/18/2023 5:39:21 PM By: Betha Loa Signed: 09/25/2023 4:43:41 PM By: Yevonne Pax RN Entered By: Betha Loa on 09/18/2023 16:39:39 -------------------------------------------------------------------------------- Wound Assessment Details Patient Name: Date of Service: [facility], [name] 09/18/2023 3:30 PM Medical Record Number: [id number] Patient Account Number: [id number] Date of Birth/Sex: Treating RN: Jan 16, 1941 (82 y.o. Female) Yevonne Pax Primary Care Blakelee Allington: Barbette Reichmann Other Clinician: Izellah, [name] ([id number]) [id number] Page 11 of 16 Referring Jawann Urbani: Treating Elo Marmolejos/Extender: Karle Plumber, Vishwanath Weeks in Treatment: 51 Wound Status Wound Number: 15 Primary Pressure Ulcer Etiology: Wound Location: Left, Anterior Lower Leg Wound Open Wounding Event: Pressure Injury Status: Date Acquired: 04/11/2023 Comorbid Cataracts, Lymphedema, Hypertension, Peripheral Arterial Disease, Weeks Of Treatment: 22 History: Peripheral Venous Disease, Type II Diabetes, Osteoarthritis, Clustered Wound: No Neuropathy Photos Wound Measurements Length: (  cm) 3.5 Width: (cm) 2.5 Depth: (cm) 0.1 Area: (cm) 6.872 Volume: (cm) 0.687 % Reduction in Area: 69.4% % Reduction in Volume: 69.4% Epithelialization: Large (67-100%) Wound Description Classification: Category/Stage IV Exudate Amount: None Present Foul Odor After Cleansing: No Slough/Fibrino No Wound Bed Granulation Amount: None Present (0%) Exposed Structure Necrotic Amount: None Present (0%) Fascia Exposed: No Fat Layer (Subcutaneous Tissue) Exposed: Yes Tendon Exposed: Yes Muscle Exposed: No Joint Exposed: No Bone Exposed: No Electronic Signature(s) Signed: 09/18/2023 5:39:21 PM By: Betha Loa Signed: 09/25/2023 4:43:41 PM By: Yevonne Pax RN Entered By: Betha Loa on 09/18/2023  16:38:50 -------------------------------------------------------------------------------- Wound Assessment Details Patient Name: Date of Service: [facility], [name] 09/18/2023 3:30 PM Medical Record Number: [id number] Patient Account Number: [id number] Date of Birth/Sex: Treating RN: [birth year] (82 y.o. Female) Yevonne Pax Primary Care Ingri Diemer: Barbette Reichmann Other Clinician: Betha Loa Referring Orby Tangen: Treating Antwione Picotte/Extender: Jearld Shines Weeks in Treatment: [address] Romie [name] ([id number]) [id number] Page 12 of 16 Wound Status Wound Number: 16 Primary Pressure Ulcer Etiology: Wound Location: Left Trochanter Wound Open Wounding Event: Pressure Injury Status: Date Acquired: 05/22/2023 Comorbid Cataracts, Lymphedema, Hypertension, Peripheral Arterial Disease, Weeks Of Treatment: 17 History: Peripheral Venous Disease, Type II Diabetes, Osteoarthritis, Clustered Wound: No Neuropathy Photos Wound Measurements Length: (cm) 1 Width: (cm) 2.4 Depth: (cm) 0.8 Area: (cm) 1.885 Volume: (cm) 1.508 % Reduction in Area: 42% % Reduction in Volume: -364% Undermining: Yes Starting Position (o'clock): 7 Ending Position (o'clock): 5 Maximum Distance: (cm) 1.6 Wound Description Classification: Unstageable/Unclassified Exudate Amount: Medium Exudate Type: Serosanguineous Exudate Color: red, brown Foul Odor After Cleansing: No Slough/Fibrino Yes Wound Bed Necrotic Amount: Large (67-100%) Exposed Structure Necrotic Quality: Eschar, Adherent Slough Fascia Exposed: No Fat Layer (Subcutaneous Tissue) Exposed: Yes Tendon Exposed: No Muscle Exposed: No Joint Exposed: No Bone Exposed: No Electronic Signature(s) Signed: 09/18/2023 5:39:21 PM By: Betha Loa Signed: 09/25/2023 4:43:41 PM By: Yevonne Pax RN Entered By: Betha Loa on 09/18/2023  16:40:35 -------------------------------------------------------------------------------- Wound Assessment Details Patient Name: Date of Service: [facility], [name] 09/18/2023 3:30 PM Medical Record Number: [id number] Patient Account Number: [id number] Date of Birth/Sex: Treating RN: [birth year] (82 y.o. Female) Yevonne Pax Primary Care Vicky Mccanless: Barbette Reichmann Other Clinician: Betha Loa Referring Herman Mell: Treating Henrine Hayter/Extender: Jearld Shines Weeks in Treatment: [address] [name] ([id number]) [id number] Page 13 of 16 Wound Status Wound Number: 18 Primary Pressure Ulcer Etiology: Wound Location: Right Metatarsal head first Wound Open Wounding Event: Pressure Injury Status: Date Acquired: 07/31/2023 Comorbid Cataracts, Lymphedema, Hypertension, Peripheral Arterial Disease, Weeks Of Treatment: 7 History: Peripheral Venous Disease, Type II Diabetes, Osteoarthritis, Clustered Wound: No Neuropathy Photos Wound Measurements Length: (cm) 1 Width: (cm) 0.7 Depth: (cm) 0.1 Area: (cm) 0.55 Volume: (cm) 0.055 % Reduction in Area: 26.3% % Reduction in Volume: 26.7% Epithelialization: None Wound Description Classification: Category/Stage II Exudate Amount: Medium Exudate Type: Serosanguineous Exudate Color: red, brown Foul Odor After Cleansing: No Slough/Fibrino Yes Wound Bed Granulation Amount: Medium (34-66%) Exposed Structure Granulation Quality: Red Fascia Exposed: No Necrotic Amount: Medium (34-66%) Fat Layer (Subcutaneous Tissue) Exposed: Yes Necrotic Quality: Eschar, Adherent Slough Tendon Exposed: No Muscle Exposed: No Joint Exposed: No Bone Exposed: No Electronic Signature(s) Signed: 09/18/2023 5:39:21 PM By: Betha Loa Signed: 09/25/2023 4:43:41 PM By: Yevonne Pax RN Entered By: Betha Loa on 09/18/2023  16:41:14 -------------------------------------------------------------------------------- Wound Assessment Details Patient Name: Date of Service: [facility], [name] 09/18/2023 3:30 PM Medical Record Number: [id number] Patient Account Number: [id number] Date of Birth/Sex: Treating RN: 1941/04/05 (  82 y.o. Female) Yevonne Pax Primary Care Terilyn Sano: Barbette Reichmann Other Clinician: Betha Loa Referring Kynlie Jane: Treating Alyanah Elliott/Extender: Jearld Shines Weeks in Treatment: [address], Aerica [name] ([id number]) [id number] Page 14 of 16 Wound Status Wound Number: 19 Primary Venous Leg Ulcer Etiology: Wound Location: Right, Anterior Lower Leg Wound Open Wounding Event: Blister Status: Date Acquired: 09/17/2023 Comorbid Cataracts, Lymphedema, Hypertension, Peripheral Arterial Disease, Weeks Of Treatment: 0 History: Peripheral Venous Disease, Type II Diabetes, Osteoarthritis, Clustered Wound: Yes Neuropathy Photos Wound Measurements Length: (cm) 4.5 Width: (cm) 2 Depth: (cm) 0.1 Area: (cm) 7.069 Volume: (cm) 0.707 % Reduction in Area: % Reduction in Volume: Epithelialization: None Tunneling: No Undermining: No Wound Description Classification: Full Thickness Without Exposed Support Exudate Amount: Medium Exudate Type: Serosanguineous Exudate Color: red, brown Structures Foul Odor After Cleansing: No Slough/Fibrino Yes Wound Bed Exposed Structure Fascia Exposed: No Fat Layer (Subcutaneous Tissue) Exposed: Yes Tendon Exposed: No Muscle Exposed: No Joint Exposed: No Bone Exposed: No Electronic Signature(s) Signed: 09/18/2023 5:39:21 PM By: Betha Loa Signed: 09/25/2023 4:43:41 PM By: Yevonne Pax RN Entered By: Betha Loa on 09/18/2023 16:44:12 -------------------------------------------------------------------------------- Wound Assessment Details Patient Name: Date of Service: [facility], [name] 09/18/2023  3:30 PM Medical Record Number: [id number] Patient Account Number: [id number] Date of Birth/Sex: Treating RN: 1940/11/16 (82 y.o. Female) Yevonne Pax Primary Care Zali Kamaka: Barbette Reichmann Other Clinician: Betha Loa Referring Evalie Hargraves: Treating Bennie Scaff/Extender: Jearld Shines Weeks in Treatment: [address] Wound Status [name], [name] ([id number]) [id number] Page 15 of 16 Wound Number: 6 Primary Pressure Ulcer Etiology: Wound Location: Right Calcaneus Wound Open Wounding Event: Pressure Injury Status: Date Acquired: 07/23/2022 Comorbid Cataracts, Lymphedema, Hypertension, Peripheral Arterial Disease, Weeks Of Treatment: 51 History: Peripheral Venous Disease, Type II Diabetes, Osteoarthritis, Clustered Wound: No Neuropathy Photos Wound Measurements Length: (cm) 2 Width: (cm) 2 Depth: (cm) 0.1 Area: (cm) 3.142 Volume: (cm) 0.314 % Reduction in Area: 66.7% % Reduction in Volume: 88.9% Epithelialization: None Wound Description Classification: Category/Stage III Wound Margin: Distinct, outline attached Exudate Amount: Medium Exudate Type: Serosanguineous Exudate Color: red, brown Foul Odor After Cleansing: No Slough/Fibrino Yes Wound Bed Granulation Amount: Medium (34-66%) Exposed Structure Granulation Quality: Red, Pink Fascia Exposed: No Necrotic Amount: Medium (34-66%) Fat Layer (Subcutaneous Tissue) Exposed: Yes Necrotic Quality: Eschar, Adherent Slough Tendon Exposed: No Muscle Exposed: No Joint Exposed: No Bone Exposed: No Electronic Signature(s) Signed: 09/18/2023 5:39:21 PM By: Betha Loa Signed: 09/25/2023 4:43:41 PM By: Yevonne Pax RN Entered By: Betha Loa on 09/18/2023 16:42:08 -------------------------------------------------------------------------------- Vitals Details Patient Name: Date of Service: [facility], [name] 09/18/2023 3:30 PM Medical Record Number: [id number] Patient Account Number:  [id number] Date of Birth/Sex: Treating RN: [birth year] (82 y.o. Female) Yevonne Pax Primary Care Nidia Grogan: Barbette Reichmann Other Clinician: Betha Loa Referring Milinda Sweeney: Treating Deadrian Toya/Extender: Jearld Shines Weeks in Treatment: 16 Vital Signs [name], [name] ([id number]) [id number] Page 16 of 16 Time Taken: 16:15 Temperature (F): 98.2 Weight (lbs): 98 Pulse (bpm): 92 Respiratory Rate (breaths/min): 18 Blood Pressure (mmHg): 114/67 Reference Range: 80 - 120 mg / dl Electronic Signature(s) Signed: 09/18/2023 5:39:21 PM By: Betha Loa Entered By: Betha Loa on 09/18/2023 16:33:36

## 2023-09-19 ENCOUNTER — Telehealth: Payer: Self-pay

## 2023-09-19 NOTE — Telephone Encounter (Signed)
Patient called office to check when next appointment is. States that she is following wound care and sees them every Tuesday. During last appointment was told by provider that they did not feel like she needs antibiotics at this time.  Informed patient that per last note she has completed her antibiotics for infection and could follow up as needed. If her or wound care have concerns for infection we could schedule appointment.  Denies any concern for infection and will continue to follow up with wound care. Will call if needed. Juanita Laster, RMA

## 2023-09-24 DIAGNOSIS — I83019 Varicose veins of right lower extremity with ulcer of unspecified site: Secondary | ICD-10-CM | POA: Diagnosis not present

## 2023-09-24 DIAGNOSIS — I872 Venous insufficiency (chronic) (peripheral): Secondary | ICD-10-CM | POA: Diagnosis not present

## 2023-09-24 DIAGNOSIS — L97919 Non-pressure chronic ulcer of unspecified part of right lower leg with unspecified severity: Secondary | ICD-10-CM | POA: Diagnosis not present

## 2023-09-24 DIAGNOSIS — L89619 Pressure ulcer of right heel, unspecified stage: Secondary | ICD-10-CM | POA: Diagnosis not present

## 2023-09-24 DIAGNOSIS — L89620 Pressure ulcer of left heel, unstageable: Secondary | ICD-10-CM | POA: Diagnosis not present

## 2023-09-24 DIAGNOSIS — M79675 Pain in left toe(s): Secondary | ICD-10-CM | POA: Diagnosis not present

## 2023-09-24 DIAGNOSIS — B351 Tinea unguium: Secondary | ICD-10-CM | POA: Diagnosis not present

## 2023-09-24 DIAGNOSIS — M79674 Pain in right toe(s): Secondary | ICD-10-CM | POA: Diagnosis not present

## 2023-09-25 ENCOUNTER — Ambulatory Visit: Payer: PPO | Admitting: Physician Assistant

## 2023-10-02 ENCOUNTER — Encounter: Payer: PPO | Admitting: Physician Assistant

## 2023-10-02 DIAGNOSIS — L89613 Pressure ulcer of right heel, stage 3: Secondary | ICD-10-CM | POA: Diagnosis not present

## 2023-10-02 DIAGNOSIS — L89894 Pressure ulcer of other site, stage 4: Secondary | ICD-10-CM | POA: Diagnosis not present

## 2023-10-02 DIAGNOSIS — I87312 Chronic venous hypertension (idiopathic) with ulcer of left lower extremity: Secondary | ICD-10-CM | POA: Diagnosis not present

## 2023-10-02 DIAGNOSIS — E11622 Type 2 diabetes mellitus with other skin ulcer: Secondary | ICD-10-CM | POA: Diagnosis not present

## 2023-10-02 DIAGNOSIS — L89623 Pressure ulcer of left heel, stage 3: Secondary | ICD-10-CM | POA: Diagnosis not present

## 2023-10-02 DIAGNOSIS — L97812 Non-pressure chronic ulcer of other part of right lower leg with fat layer exposed: Secondary | ICD-10-CM | POA: Diagnosis not present

## 2023-10-02 DIAGNOSIS — L89890 Pressure ulcer of other site, unstageable: Secondary | ICD-10-CM | POA: Diagnosis not present

## 2023-10-02 NOTE — Progress Notes (Signed)
[name], [name] ([id number]) [id number] Page 1 of 14 Visit Report for 10/02/2023 Chief Complaint Document Details Patient Name: Date of Service: [name] [name]. 10/02/2023 3:30 PM Medical Record Number: [id number] Patient Account Number: [id number] Date of Birth/Sex: Treating RN: 24-Apr-1941 (82 y.o. Freddy [name] Primary Care Provider: Barbette Reichmann Other Clinician: Betha Loa Referring Provider: Treating Provider/Extender: Jearld Shines Weeks in Treatment: 24 Information Obtained from: Patient Chief Complaint Right heel wound, right hip wound, right ankle wound, left ankle wound, right lower extremity wound, left anterior lower extremity wound, left hip wound Electronic Signature(s) Signed: 10/02/2023 3:54:52 PM By: Allen Derry PA-C Entered By: Allen Derry on 10/02/2023 15:54:52 -------------------------------------------------------------------------------- HPI Details Patient Name: Date of Service: [name], [name] 10/02/2023 3:30 PM Medical Record Number: [id number] Patient Account Number: [id number] Date of Birth/Sex: Treating RN: Feb 01, 1941 (82 y.o. Freddy [name] Primary Care Provider: Barbette Reichmann Other Clinician: Betha Loa Referring Provider: Treating Provider/Extender: Jearld Shines Weeks in Treatment: 4 History of Present Illness HPI Description: 82 year old patient who comes with a referral for bilateral lower extremity edema and a lower extremity ulceration and has been sent by her PCP [name]. I understand the patient was recently put on amoxicillin and doxycycline but could not tolerate the amoxicillin. doxycycline course was completed. a BNP and EKG was supposed to be normal and the patient did not have any dyspnea. the patient has been on a diuretic. The patient was also prescribed a pair of elastic compression stockings of the 20-30 mmHg pressure variety. x-ray  of the right ankle was done on 09/20/2015 and showed posttraumatic and postsurgical changes of the right ankle with secondary degenerative changes of the tibiotalar joint and to a lesser degree the subtalar joint. No definite acute bony abnormalities are noted. Past medical history significant for diabetes mellitus, hypertension, hyperlipidemia, right breast cancer treated with a mastectomy in 2014. She has never smoked. 10/24/2015 -- she had delayed her vascular test because of her husband surgery but she is now ready to get him taken care of. He is also unable to use compression stockings and hence we will need to order her Juzo wraps. 10/31/2015-- was seen by Dr. Wyn Quaker on 10/28/2015. She had a left lower extremity arterial duplex done at his office a couple of years ago and that was essentially normal. Today they performed a venous duplex which revealed no evidence of deep vein thrombosis, superficial thrombophlebitis, no venous reflex was seen on the right and a minimal amount of reflux was seen on the left great saphenous vein but no significant reflux was seen. Impression was that there was a component of lymphedema present from a previous surgery and he would recommend compression stockings and leg elevation. [name], [name] ([id number]) [id number] Page 2 of 14 10/2; is a patient we are following every 2 weeks. Apparently seen by Dr. Joylene Draft since she was last here no a day additional antibiotics were added. There was some suggestion about [name] obtaining cultures and we could bring them to her attention. The patient has stage IV wound over the right greater trochanter and unstageable area on the left but it is probably stage IV as well. She has a small area on the tip of her left heel larger wounds on the left [name] left medial foot, left medial ankle Readmission: 07-24-2022 upon evaluation today patient presents for initial inspection here in our clinic concerning  issues that she has been having with her legs this is  actually been going on for several years according to what her family member with her today tells me as well as what the patient reiterates as well. She is currently most recently been seeing Dr. Ether Griffins and subsequently he had her in [place] boots. However 2 weeks ago he referred her to [name] and then subsequently took her out of the [place]na boot wraps at that point. At this time the left leg looks to be worse in the right leg currently. She is on Lasix and lisinopril with hydrochlorothiazide she has high blood pressure she also has issues currently with lower extremity swelling and edema which has been an ongoing issue for her as well. Patient does have a history of chronic venous hypertension, lymphedema, diabetes mellitus type 2, hypertension, peripheral vascular disease, and neuropathy. Currently she is on Lasix as well as lisinopril with hydrochlorothiazide. 08-02-2022 upon evaluation today patient presents for reevaluation the good news is she is actually doing somewhat better in regard to the wound and the overall appearance and sinuses. The unfortunate thing is her infection really is not significantly improved we did have to switch out her antibiotic once we got that final result back and I switched her to Levaquin and away from the doxycycline. Unfortunately the doxycycline had been doing poorly for her. In fact she had had diarrhea from the time she started taking it on Friday and she is still having it when she shows up today for evaluation. Again I was not aware of this and obviously she does appear to be somewhat dehydrated as well based on what I see. My concern which I discussed with the patient today is the possibility of a C. difficile infection. With that being said fact this started immediately upon taking the doxycycline makes me think that it was just the medicine and is not completely out of her system yet despite having taken the last  dose Tuesday morning. Nonetheless with what we are seeing currently I want to be sure that reason I Minna contact her primary care provider and see if a would be willing to see her and test for C. difficile infection. 08-07-2022 upon evaluation today patient appears to be doing well currently in regard to her wounds which are actually measuring much better this is great news. Fortunately I do not see any signs of active infection locally or systemically at this time which is great as well. The good news is she was tested for a C. difficile infection and it was negative. I am very pleased and thankful for primary care provider for doing that so this means that she was just having a severe reaction to the doxycycline we have added that to her allergy list at this point. 08-14-2022 upon evaluation today patient appears to be doing well currently in regard to her dehydration she is actually significantly improved compared to last time I saw her last week. With that being said I do not see any evidence of active infection systemically at this time which is great news. However locally she still does appear to have cellulitis in left lower extremity. I discussed with her today that I do believe she would benefit from going ahead and starting the Levaquin. Again we will concerned about the C. difficile infection of the diarrhea but that this turned out to be just an issue with a reaction to the doxycycline. My hope is that the Levaquin will not cause her any complication and will be able to treat the infection. I did  review her arterial study as well which was dated on 07-31-2022. It showed that she had a ABI on the right of 1.30 and on the left of 1.27 with a TBI on the right of 1.12 and the left 1.21 this is a normal arterial study. Again on the patient's wound culture she actually did show evidence of Proteus, Morganella, and MRSA. Levaquin is a good option here across the board. 09/26/2022 Ms. [name]  [name] is a 82 year old female with a past medical history of type 2 diabetes currently controlled on oral agents, venous insufficiency/lymphedema, right breast cancer and chronic diastolic heart failure that presents to the clinic for a 1 month history of nonhealing wounds to the left lower extremity, right heel and left buttocks. She states that the buttocks wound and the right heel wound developed while she was in the hospital. She was admitted on 08/22/2022 for severe sepsis secondary to acute sigmoid and distal colonic diverticulitis. At that time it was noted she had a stage I decubitus ulcer to her bilateral buttocks. A wound to the heel was not mentioned. She currently denies systemic signs of infection. She came into clinic in a blue gown with no undergarments. She has not been dressing the wounds. She states she just recently obtained home health and they are coming out for the first time this week. She is seen in our clinic often for lower extremity wounds secondary to venous insufficiency. 11/22; patient presents for follow-up. Son is present during the encounter. Per son it sounds like they are not doing any dressing changes. She states she has home health but they have not come out. She is going to let [name] know which home health agency she has been approved for so we can send orders. Currently she denies signs of infection. 12/13; patient presents for follow-up. Patient has home health and they are coming out once a week. It appears that there has not been any dressing changes except for with home health. Patient has a newly discovered wound to the left foot. There is exposed bone. There is slight erythema and increased warmth to the surrounding tissue. Patient is completely unaware of this wound. 12/20; patient presents for follow-up. She has been taking her oral antibiotics. She now has an eschar and wound to the right hip. She has been using Dakin's wet-to-dry dressings to the left  foot wound and buttocks wound. She has been using Medihoney and silver alginate to the right heel wound. She currently denies systemic signs of infection. She is mainly bedbound or in a wheelchair. 1/10; patient presents for follow-up. Patient had her left second toenail removed by Dr. Ether Griffins, podiatry on 12/21. Unfortunately she did not feel well and she was admitted to the hospital for sepsis. Her right heel wound was thought to be infected and this was debrided in the OR by Dr. Ether Griffins. Culture and bone biopsy had no growth. She has been using Medihoney to all the wound beds except for the lefty buttocks wound for which she uses Dakin's wet-to-dry dressings. She has developed a pressure injury to the left heel now. This is despite having Prevalon boots. Although she is not wearing them today. She has completed 4 weeks of Augmentin and also a week of doxycycline for osteomyelitis of the left foot. Her left foot wound no longer probes to bone. Currently she denies signs of infection. 1/24; patient presents for follow-up. She has been using Medihoney and Hydrofera Blue to the wound beds except for this buttocks  wound she has been using Dakin's wet-to-dry dressings. She has developed 2 new wounds 1 to the left heel and the other to the right medial ankle. She claims she is wearing her Prevalon boots all the time although she does not have them on today. She currently denies signs of infection. 2/21; patient presents for follow-up. She was recently hospitalized on 12/19/2022 for sepsis secondary to Enterococcus bacillus bacteremia. She was treated with IV antibiotics and has completed her discharge oral antibiotics. She had a CT scanning of the abdomen/pelvis without acute abnormalities. Husband is present with patient today. They have been using Dakin's wet-to-dry dressings to the sacral wound and hip wound and Hydrofera Blue and Medihoney to the feet wounds. They are not changing the dressings daily. It  is unclear how often they actually change the dressings. She has been wearing her Prevalon boots at night but not during the day. She currently denies systemic signs of infection. 3/20; patient presents for follow-up. She again was in the hospital for severe sepsis on 2/24; MRIs at that time showed findings suspicious for osteomyelitis to the right hip. She is currently on Augmentin to complete 4 weeks of this. She is following with ID. She has been using Dakin's wet-to-dry dressings to the sacrum and right hip however the solution is starting to cause discomfort. T the rest of the wound she has been using Hydrofera Blue and Medihoney. Patient o and son are not interested in doing palliative care or hospice. They state that they had discussions while in the hospital. 4/17; patient presents for follow-up. She has been using Vashe wet-to-dry dressings to the sacrum and right trochanter wound. She has been using Medihoney and Hydrofera Blue to the bilateral feet wounds. She has no issues or complaints today. She denies signs of infection. 5/1; patient developed a new wound to the right leg. Son was present and states that there was a scab and he removed it creating a wound. He has been keeping the area covered. T the sacral and right trochanter wound they have been using Vashe wet-to-dry dressings and Medihoney and Hydrofera Blue to the o bilateral feet wounds. She denies signs of infection. 6/1; 1 month follow-up. She has a new area on the left anterior lower leg. In the meantime the sacral ulcer is closed. She still has an extensive wound on the [name] ([id number]) [id number] Page 3 of 14 right hip with exposed tendon over the greater trochanter. On the lower extremities the left anterior lower leg wound is new right ankle left heel and right heel all look reasonably stable. We are using Vashe wet-to-dry on the hip Medihoney and Hydrofera Blue on the lower  extremities. She is accompanied by her son I think who is doing the dressings. They do not have a pressure-relief surface at home but they do have a hospital bed 7/10; I have not seen this patient since 03/13/2023. She was scheduled to be seen on 5/29 however missed her appointment due to illness. She was seen on 6/5 by Dr. Leanord Hawking and she presents today for her follow-up. Unfortunately her wounds have declined since I last saw her. She has several new wounds including one to the left hip wound and one left anterior lower leg. The right hip wound has declined and has maggots with bone exposed. She has been using Vashe wet- to-dry dressings to Right hip wound and Medihoney and Hydrofera Blue to the The remaining wounds. Sacral wound remains closed. 7/24; patient presents for  follow-up. At last clinic visit I recommended she go to the ED for potential admission, IV antibiotics and further imaging. She had MRI of the pelvis and left foot that showed osteomyelitis. Podiatry advised conservative management. She was also seen by infectious disease who discharged patient on 2 weeks of linezolid and 2 weeks of ciprofloxacin. Patient is at home and has home health. Palliative care was consulted during the stay and she is full code and has declined further outpatient palliative care and hospice services.. She currently denies systemic signs of infection. Overall there is improvement in the appearance of the wounds. There is healthier granulation tissue present. However this is overall poor prognosis case. She has Prevalon boots but it is unclear if she is using these. She does not have them on today. She follows up with infectious disease next week. 8/7; Patient's been using Vashe wet-to-dry dressing to all the wound beds except for the left hip she has been using Santyl. She followed up with infectious disease on 8/1. Currently she is on linezolid and ciprofloxacin. They recommended lab work to see if she needs  extension of her oral antibiotics. She currently denies systemic signs of infection. 8/21; patient presents for follow-up. She has been using Vashe wet-to-dry dressings to all wounds except the left hip she has been using Santyl. All wounds have improved in size and appearance. She has healthy granulation tissue present to all wounds except the left hip still has nonviable tissue throughout. She states she has completed her oral antibiotics per ID. She currently denies systemic signs of infection. 9/4; patient presents for follow-up. She has been using Vashe wet-to-dry dressings to all wounds except the left hip. T the left hip she has been using Santyl. o All wounds appear well-healing. She currently denies systemic signs of infection. 9/18; 2-week follow-up. This is a very disabled woman who is nonambulatory. 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 She has significant bilateral lower extremity pressure ulcers bilaterally. 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 She is not ambulatory. She has extensive wounds on both trochanters. The right has exposed bone in the left nonviable tissue. I aggressively debrided this the last time she was here. We have been using Santyl on the left hip and Vashe wet-to-dry on the right hip. She has wounds on her bilateral lower legs extensively on the right medial foot first met head ankle and lower leg area on the left lower leg and left heel. All of these seem somewhat improved again using Vashe wet-to-dry. We have been attempting to get a level 3 surface or some form of offloading surface for the patient although having trouble with their insurance which apparently is [facility] 10/16; patient is doing very well. The wounds on the lower extremity even the extensive areas seem to be a lot better with  healthy granulation. We have been using the [facility] equivalent of Vashe wet-to-dry. There is areas over the lateral part of the greater trochanters. The area on the left looks a lot better we have been using Santyl with backing wet to dry gauze. We have been using Vashe wet-to-dry in the right greater trochanter. I think we are in line with changing the dressing next week to both hip areas We are apparently finally making some inroads in getting her a level 3 surface for  her bed 10/23; patient continues to make nice improvement in the bilateral lower leg wounds using the equivalent of Vashe wet-to-dry. I did plan to change this dressing but the wounds are making such a nice improvement I continued it. T oday I have changed the greater trochanter stage IV pressure ulcers to a Prisma and then hydrogel wet-to-dry packing with border foam change every 2 D. Really remarkable improvement credit to the family this looking after her 09-18-2023 upon evaluation today patient appears to be doing more poorly especially in regard to her heels and lower extremities where she has deep tissue injuries on both heels and she has new blisters that have opened up on her legs. This is due to the fact that she is not elevating her legs she is staying in her wheelchair sitting not moving around throughout the day and this is not the best thing for her to be honest. Fortunately I do not think she is infected right now but at the same time this is still concerning to me. 10-02-2023 upon evaluation today patient appears to be doing really about the same in regard to her wounds. There is not much improvement here unfortunately. I do not see any signs of active infection at this time which is good news. With that being said she still has significant wounds on both hips the left is better than the right she also has many wounds scattered over her legs as well as both heels. Electronic Signature(s) Signed: 10/02/2023 5:17:17 PM By:  Allen Derry PA-C Entered By: Allen Derry on 10/02/2023 17:17:17 -------------------------------------------------------------------------------- Physical Exam Details Patient Name: Date of Service: [facility], [name] 10/02/2023 3:30 PM Medical Record Number: [id number] Patient Account Number: [id number] Date of Birth/Sex: Treating RN: 01/08/1941 (82 y.o. Freddy [name] Primary Care Provider: Barbette Reichmann Other Clinician: Maymie, Vorndran ([id number]) [id number] Page 4 of 14 Referring Provider: Treating Provider/Extender: Karle Plumber, Vishwanath Weeks in Treatment: 24 Constitutional Well-nourished and well-hydrated in no acute distress. Respiratory normal breathing without difficulty. Psychiatric this patient is able to make decisions and demonstrates good insight into disease process. Alert and Oriented x 3. pleasant and cooperative. Notes Upon inspection patient's wound bed actually showed signs of good granulation epithelization at this point. She has a lot of improvement in regard to the legs as far as the overall appearance there is no signs of infection I do not see any indication for antibiotics at this point. With that being said I do believe that the Dakin's moistened gauze to the legs may be actually causing some issues here with sticking and possibly new wounds. I really feel like she may do better with Xeroform gauze we can give that a trial. I also think Betadine to the left heel would be ideal and on the right heel we will use Xeroform as well as the right foot. For both of the hips we will going be using Dakin's moistened gauze along with collagen. Electronic Signature(s) Signed: 10/02/2023 5:17:56 PM By: Allen Derry PA-C Entered By: Allen Derry on 10/02/2023 17:17:55 -------------------------------------------------------------------------------- Physician Orders Details Patient Name: Date of Service: [facility], [name] 10/02/2023 3:30 PM Medical Record Number: [id number] Patient Account Number: [id number] Date of Birth/Sex: Treating RN: 25-May-1941 (82 y.o. Freddy [name] Primary Care Provider: Barbette Reichmann Other Clinician: Betha Loa Referring Provider: Treating Provider/Extender: Jearld Shines Weeks in Treatment: 54 The following information was scribed by: Betha Loa The information was scribed for: Allen Derry Verbal /  Phone Orders: No Diagnosis Coding ICD-10 Coding Code Description [phone] Non-pressure chronic ulcer of other part of left lower leg with fat layer exposed L97.812 Non-pressure chronic ulcer of other part of right lower leg with fat layer exposed I87.312 Chronic venous hypertension (idiopathic) with ulcer of left lower extremity I89.0 Lymphedema, not elsewhere classified E11.622 Type 2 diabetes mellitus with other skin ulcer L89.610 Pressure ulcer of right heel, unstageable L89.513 Pressure ulcer of right ankle, stage 3 L89.620 Pressure ulcer of left heel, unstageable M86.172 Other acute osteomyelitis, left ankle and foot L89.214 Pressure ulcer of right hip, stage 4 L89.220 Pressure ulcer of left hip, unstageable I50.32 Chronic diastolic (congestive) heart failure Z79.84 Long term (current) use of oral hypoglycemic drugs Follow-up Appointments Return Appointment in 3 weeks. Home Health VERNELLE, FLUET ([id number]) [id number] Page 5 of 14 Home Health Company: - [facility] [place], [place] [phone] [facility] for wound care. May utilize formulary equivalent dressing for wound treatment orders unless otherwise specified. Home Health Nurse may visit PRN to address patients wound care needs. Off-Loading Low air-loss mattress (Group 2) Wound Treatment Wound #11 - Trochanter Wound Laterality: Right Cleanser: Vashe 5.8 (oz) 1 x Per Day/30 Days Discharge Instructions: Use as directed for cleaning  wound Prim Dressing: Prisma 4.34 (in) 1 x Per Day/30 Days ary Discharge Instructions: pack into tunnel and wound Secondary Dressing: Gauze 1 x Per Day/30 Days Discharge Instructions: moisten with Vashe and place over collagen Secondary Dressing: (BORDER) Zetuvit Plus SILICONE BORDER Dressing 5x5 (in/in) 1 x Per Day/30 Days Discharge Instructions: Please do not put silicone bordered dressings under wraps. Use non-bordered dressing only. Wound #13 - Calcaneus Wound Laterality: Left Cleanser: Vashe 5.8 (oz) (Home Health) 1 x Per Day/30 Days Discharge Instructions: damp to dry daily Topical: Betadine 1 x Per Day/30 Days Discharge Instructions: Apply betadine to heel Secondary Dressing: ABD Pad 5x9 (in/in) 1 x Per Day/30 Days Discharge Instructions: Cover with ABD pad Secondary Dressing: Kerlix 4.5 x 4.1 (in/yd) 1 x Per Day/30 Days Discharge Instructions: Apply Kerlix 4.5 x 4.1 (in/yd) as instructed Secured With: ACE WRAP - 28M ACE Elastic Bandage With VELCRO Brand Closure, 4 (in) 1 x Per Day/30 Days Wound #14 - Lower Leg Wound Laterality: Right, Medial, Distal Cleanser: Vashe 5.8 (oz) (Home Health) 1 x Per Day/30 Days Discharge Instructions: damp to dry daily Prim Dressing: Xeroform 5x9-HBD (in/in) 1 x Per Day/30 Days ary Discharge Instructions: Apply Xeroform 5x9-HBD (in/in) as directed Secondary Dressing: ABD Pad 5x9 (in/in) 1 x Per Day/30 Days Discharge Instructions: Cover with ABD pad Secondary Dressing: Kerlix 4.5 x 4.1 (in/yd) 1 x Per Day/30 Days Discharge Instructions: Apply Kerlix 4.5 x 4.1 (in/yd) as instructed Secured With: ACE WRAP - 28M ACE Elastic Bandage With VELCRO Brand Closure, 4 (in) 1 x Per Day/30 Days Wound #15 - Lower Leg Wound Laterality: Left, Anterior Cleanser: Vashe 5.8 (oz) (Home Health) 1 x Per Day/30 Days Discharge Instructions: damp to dry daily Prim Dressing: Xeroform 5x9-HBD (in/in) 1 x Per Day/30 Days ary Discharge Instructions: Apply Xeroform 5x9-HBD  (in/in) as directed Secondary Dressing: ABD Pad 5x9 (in/in) 1 x Per Day/30 Days Discharge Instructions: Cover with ABD pad Secondary Dressing: Kerlix 4.5 x 4.1 (in/yd) 1 x Per Day/30 Days Discharge Instructions: Apply Kerlix 4.5 x 4.1 (in/yd) as instructed Secured With: ACE WRAP - 28M ACE Elastic Bandage With VELCRO Brand Closure, 4 (in) 1 x Per Day/30 Days Wound #16 - Trochanter Wound Laterality: Left Cleanser: Vashe 5.8 (oz) 1 x Per Day/30 Days  Discharge Instructions: Use as directed for cleaning wound Prim Dressing: Prisma 4.34 (in) 1 x Per Day/30 Days ary Discharge Instructions: pack into tunnel and wound Secondary Dressing: Gauze 1 x Per Day/30 Days Discharge Instructions: moisten with Vashe and place over collagen Wallman, [name] ([id number]) [id number] Page 6 of 14 Secondary Dressing: (BORDER) Zetuvit Plus SILICONE BORDER Dressing 5x5 (in/in) 1 x Per Day/30 Days Discharge Instructions: Please do not put silicone bordered dressings under wraps. Use non-bordered dressing only. Wound #18 - Metatarsal head first Wound Laterality: Right Cleanser: Vashe 5.8 (oz) (Home Health) 1 x Per Day/30 Days Discharge Instructions: damp to dry daily Prim Dressing: Xeroform 5x9-HBD (in/in) 1 x Per Day/30 Days ary Discharge Instructions: Apply Xeroform 5x9-HBD (in/in) as directed Secondary Dressing: ABD Pad 5x9 (in/in) 1 x Per Day/30 Days Discharge Instructions: Cover with ABD pad Secondary Dressing: Kerlix 4.5 x 4.1 (in/yd) 1 x Per Day/30 Days Discharge Instructions: Apply Kerlix 4.5 x 4.1 (in/yd) as instructed Secured With: ACE WRAP - 40M ACE Elastic Bandage With VELCRO Brand Closure, 4 (in) 1 x Per Day/30 Days Wound #19 - Lower Leg Wound Laterality: Right, Anterior Cleanser: Vashe 5.8 (oz) (Home Health) 1 x Per Day/30 Days Discharge Instructions: damp to dry daily Prim Dressing: Xeroform 5x9-HBD (in/in) 1 x Per Day/30 Days ary Discharge Instructions: Apply Xeroform  5x9-HBD (in/in) as directed Secondary Dressing: ABD Pad 5x9 (in/in) 1 x Per Day/30 Days Discharge Instructions: Cover with ABD pad Secondary Dressing: Kerlix 4.5 x 4.1 (in/yd) 1 x Per Day/30 Days Discharge Instructions: Apply Kerlix 4.5 x 4.1 (in/yd) as instructed Secured With: ACE WRAP - 40M ACE Elastic Bandage With VELCRO Brand Closure, 4 (in) 1 x Per Day/30 Days Wound #6 - Calcaneus Wound Laterality: Right Cleanser: Vashe 5.8 (oz) (Home Health) 1 x Per Day/30 Days Discharge Instructions: damp to dry daily Prim Dressing: Xeroform 5x9-HBD (in/in) 1 x Per Day/30 Days ary Discharge Instructions: Apply Xeroform 5x9-HBD (in/in) as directed Secondary Dressing: ABD Pad 5x9 (in/in) 1 x Per Day/30 Days Discharge Instructions: Cover with ABD pad Secondary Dressing: Kerlix 4.5 x 4.1 (in/yd) 1 x Per Day/30 Days Discharge Instructions: Apply Kerlix 4.5 x 4.1 (in/yd) as instructed Secured With: ACE WRAP - 40M ACE Elastic Bandage With VELCRO Brand Closure, 4 (in) 1 x Per Day/30 Days Devices Bed- Mattress-Overlay or Replacement - Group 2 air mattress Electronic Signature(s) Signed: 10/03/2023 1:29:03 PM By: Allen Derry PA-C Signed: 10/03/2023 4:53:18 PM By: Betha Loa Entered By: Betha Loa on 10/02/2023 17:30:25 Modisette, [name] ([id number]) [id number] Page 7 of 14 Prescription 10/02/2023 -------------------------------------------------------------------------------- Dicky Doe [name]. Allen Derry PA-C Patient Name: Provider: Jul 28, 1941 [id number] Date of Birth: NPI#: [id number] Sex: DEA #: [phone] Phone #: License #: UPN: Patient Address: [address] [facility] [place], [place] [address] [facility] [address] [place], [place] [address] [phone] Allergies Sulfa (Sulfonamide Antibiotics) Provider's Orders Bed- Mattress-Overlay or Replacement - Group 2  air mattress Hand Signature: Date(s): Electronic Signature(s) Signed: 10/03/2023 1:29:03 PM By: Allen Derry PA-C Signed: 10/03/2023 4:53:18 PM By: Betha Loa Entered By: Betha Loa on 10/02/2023 17:30:27 -------------------------------------------------------------------------------- Problem List Details Patient Name: Date of Service: [facility], [name] 10/02/2023 3:30 PM Medical Record Number: [id number] Patient Account Number: [id number] Date of Birth/Sex: Treating RN: 07-29-1941 (82 y.o. Freddy [name] Primary Care Provider: Barbette Reichmann Other Clinician: Betha Loa Referring Provider: Treating Provider/Extender: Jearld Shines Weeks in Treatment: 85 Active Problems ICD-10 Encounter  Code Description Active Date MDM Diagnosis L97.822 Non-pressure chronic ulcer of other part of left lower leg with fat layer 09/26/2022 No Yes exposed L97.812 Non-pressure chronic ulcer of other part of right lower leg with fat layer 03/13/2023 No Yes exposed I87.312 Chronic venous hypertension (idiopathic) with ulcer of left lower extremity 09/26/2022 No Yes I89.0 Lymphedema, not elsewhere classified 09/26/2022 No Yes [place], [place] [name] ([id number]) [id number] Page 8 of 14 E11.622 Type 2 diabetes mellitus with other skin ulcer 09/26/2022 No Yes L89.610 Pressure ulcer of right heel, unstageable 09/26/2022 No Yes L89.513 Pressure ulcer of right ankle, stage 3 12/05/2022 No Yes L89.620 Pressure ulcer of left heel, unstageable 12/05/2022 No Yes M86.172 Other acute osteomyelitis, left ankle and foot 10/24/2022 No Yes L89.214 Pressure ulcer of right hip, stage 4 11/21/2022 No Yes L89.220 Pressure ulcer of left hip, unstageable 05/22/2023 No Yes I50.32 Chronic diastolic (congestive) heart failure 09/26/2022 No Yes Z79.84 Long term (current) use of oral hypoglycemic drugs 03/13/2023 No Yes Inactive Problems ICD-10 Code Description Active Date  Inactive Date S81.802A Unspecified open wound, left lower leg, initial encounter 05/22/2023 05/22/2023 Resolved Problems ICD-10 Code Description Active Date Resolved Date L89.320 Pressure ulcer of left buttock, unstageable 09/26/2022 09/26/2022 S91.302A Unspecified open wound, left foot, initial encounter 10/24/2022 10/24/2022 Electronic Signature(s) Signed: 10/02/2023 3:54:48 PM By: Allen Derry PA-C Entered By: Allen Derry on 10/02/2023 15:54:47 Bowery, [name] ([id number]) [id number] Page 9 of 14 -------------------------------------------------------------------------------- Progress Note Details Patient Name: Date of Service: [facility] [name], [name] 10/02/2023 3:30 PM Medical Record Number: [id number] Patient Account Number: [id number] Date of Birth/Sex: Treating RN: Nov 24, 1940 (82 y.o. Freddy [name] Primary Care Provider: Barbette Reichmann Other Clinician: Betha Loa Referring Provider: Treating Provider/Extender: Jearld Shines Weeks in Treatment: 3 Subjective Chief Complaint Information obtained from Patient Right heel wound, right hip wound, right ankle wound, left ankle wound, right lower extremity wound, left anterior lower extremity wound, left hip wound History of Present Illness (HPI) 82 year old patient who comes with a referral for bilateral lower extremity edema and a lower extremity ulceration and has been sent by her PCP [name]. I understand the patient was recently put on amoxicillin and doxycycline but could not tolerate the amoxicillin. doxycycline course was completed. a BNP and EKG was supposed to be normal and the patient did not have any dyspnea. the patient has been on a diuretic. The patient was also prescribed a pair of elastic compression stockings of the 20-30 mmHg pressure variety. x-ray of the right ankle was done on 09/20/2015 and showed posttraumatic and postsurgical changes of the right ankle  with secondary degenerative changes of the tibiotalar joint and to a lesser degree the subtalar joint. No definite acute bony abnormalities are noted. Past medical history significant for diabetes mellitus, hypertension, hyperlipidemia, right breast cancer treated with a mastectomy in 2014. She has never smoked. 10/24/2015 -- she had delayed her vascular test because of her husband surgery but she is now ready to get him taken care of. He is also unable to use compression stockings and hence we will need to order her Juzo wraps. 10/31/2015-- was seen by Dr. Wyn Quaker on 10/28/2015. She had a left lower extremity arterial duplex done at his office a couple of years ago and that was essentially normal. Today they performed a venous duplex which revealed no evidence of deep vein thrombosis, superficial thrombophlebitis, no venous reflex was seen on the right and a minimal amount of reflux was seen on the left great saphenous  vein but no significant reflux was seen. Impression was that there was a component of lymphedema present from a previous surgery and he would recommend compression stockings and leg elevation. 10/2; is a patient we are following every 2 weeks. Apparently seen by Dr. Joylene Draft since she was last here no a day additional antibiotics were added. There was some suggestion about [name] obtaining cultures and we could bring them to her attention. The patient has stage IV wound over the right greater trochanter and unstageable area on the left but it is probably stage IV as well. She has a small area on the tip of her left heel larger wounds on the left [name] left medial foot, left medial ankle Readmission: 07-24-2022 upon evaluation today patient presents for initial inspection here in our clinic concerning issues that she has been having with her legs this is actually been going on for several years according to what her family member with her today tells me as well as what the patient reiterates as  well. She is currently most recently been seeing Dr. Ether Griffins and subsequently he had her in Anderson boots. However 2 weeks ago he referred her to [name] and then subsequently took her out of the Unna boot wraps at that point. At this time the left leg looks to be worse in the right leg currently. She is on Lasix and lisinopril with hydrochlorothiazide she has high blood pressure she also has issues currently with lower extremity swelling and edema which has been an ongoing issue for her as well. Patient does have a history of chronic venous hypertension, lymphedema, diabetes mellitus type 2, hypertension, peripheral vascular disease, and neuropathy. Currently she is on Lasix as well as lisinopril with hydrochlorothiazide. 08-02-2022 upon evaluation today patient presents for reevaluation the good news is she is actually doing somewhat better in regard to the wound and the overall appearance and sinuses. The unfortunate thing is her infection really is not significantly improved we did have to switch out her antibiotic once we got that final result back and I switched her to Levaquin and away from the doxycycline. Unfortunately the doxycycline had been doing poorly for her. In fact she had had diarrhea from the time she started taking it on Friday and she is still having it when she shows up today for evaluation. Again I was not aware of this and obviously she does appear to be somewhat dehydrated as well based on what I see. My concern which I discussed with the patient today is the possibility of a C. difficile infection. With that being said fact this started immediately upon taking the doxycycline makes me think that it was just the medicine and is not completely out of her system yet despite having taken the last dose Tuesday morning. Nonetheless with what we are seeing currently I want to be sure that reason I Minna contact her primary care provider and see if a would be willing to see her and test for C.  difficile infection. 08-07-2022 upon evaluation today patient appears to be doing well currently in regard to her wounds which are actually measuring much better this is great news. Fortunately I do not see any signs of active infection locally or systemically at this time which is great as well. The good news is she was tested for a C. difficile infection and it was negative. I am very pleased and thankful for primary care provider for doing that so this means that she was just having a  severe reaction to the doxycycline we have added that to her allergy list at this point. 08-14-2022 upon evaluation today patient appears to be doing well currently in regard to her dehydration she is actually significantly improved compared to last time I saw her last week. With that being said I do not see any evidence of active infection systemically at this time which is great news. However locally she still does appear to have cellulitis in left lower extremity. I discussed with her today that I do believe she would benefit from going ahead and starting the Levaquin. Again we will concerned about the C. difficile infection of the diarrhea but that this turned out to be just an issue with a reaction to the doxycycline. My hope is that the Levaquin will not cause her any complication and will be able to treat the infection. I did review her arterial study as well which was dated on 07-31-2022. It showed that she had a ABI on the right of 1.30 and on the left of 1.27 with a TBI on the right of 1.12 and the left 1.21 this is a normal arterial study. Again on the patient's wound culture she actually did show evidence of Proteus, Morganella, and MRSA. Levaquin is a good option here across the board. 09/26/2022 Ms. [name] Klocko is a 82 year old female with a past medical history of type 2 diabetes currently controlled on oral agents, venous insufficiency/lymphedema, right breast cancer and chronic diastolic heart  failure that presents to the clinic for a 1 month history of nonhealing wounds to the left lower extremity, right heel and left buttocks. She states that the buttocks wound and the right heel wound developed while she was in the hospital. She was admitted on 08/22/2022 for severe sepsis secondary to acute sigmoid and distal colonic diverticulitis. At that time it was noted she had a stage I decubitus ulcer to her bilateral buttocks. A wound to the heel was not mentioned. She currently denies systemic signs of infection. She came into clinic in a blue gown with no undergarments. She has not been dressing the wounds. She states she just recently obtained home health and they are coming out for the first time this week. She is seen in our clinic often for lower extremity wounds secondary to venous insufficiency. 11/22; patient presents for follow-up. Son is present during the encounter. Per son it sounds like they are not doing any dressing changes. She states she has home health but they have not come out. She is going to let [name] know which home health agency she has been approved for so we can send orders. Currently she denies signs of infection. 12/13; patient presents for follow-up. Patient has home health and they are coming out once a week. It appears that there has not been any dressing changes HAMIDA, JIMINEZ [name] ([id number]) [id number] Page 10 of 14 except for with home health. Patient has a newly discovered wound to the left foot. There is exposed bone. There is slight erythema and increased warmth to the surrounding tissue. Patient is completely unaware of this wound. 12/20; patient presents for follow-up. She has been taking her oral antibiotics. She now has an eschar and wound to the right hip. She has been using Dakin's wet-to-dry dressings to the left foot wound and buttocks wound. She has been using Medihoney and silver alginate to the right heel wound. She  currently denies systemic signs of infection. She is mainly bedbound or in a wheelchair. 1/10; patient presents  for follow-up. Patient had her left second toenail removed by Dr. Ether Griffins, podiatry on 12/21. Unfortunately she did not feel well and she was admitted to the hospital for sepsis. Her right heel wound was thought to be infected and this was debrided in the OR by Dr. Ether Griffins. Culture and bone biopsy had no growth. She has been using Medihoney to all the wound beds except for the lefty buttocks wound for which she uses Dakin's wet-to-dry dressings. She has developed a pressure injury to the left heel now. This is despite having Prevalon boots. Although she is not wearing them today. She has completed 4 weeks of Augmentin and also a week of doxycycline for osteomyelitis of the left foot. Her left foot wound no longer probes to bone. Currently she denies signs of infection. 1/24; patient presents for follow-up. She has been using Medihoney and Hydrofera Blue to the wound beds except for this buttocks wound she has been using Dakin's wet-to-dry dressings. She has developed 2 new wounds 1 to the left heel and the other to the right medial ankle. She claims she is wearing her Prevalon boots all the time although she does not have them on today. She currently denies signs of infection. 2/21; patient presents for follow-up. She was recently hospitalized on 12/19/2022 for sepsis secondary to Enterococcus bacillus bacteremia. She was treated with IV antibiotics and has completed her discharge oral antibiotics. She had a CT scanning of the abdomen/pelvis without acute abnormalities. Husband is present with patient today. They have been using Dakin's wet-to-dry dressings to the sacral wound and hip wound and Hydrofera Blue and Medihoney to the feet wounds. They are not changing the dressings daily. It is unclear how often they actually change the dressings. She has been wearing her Prevalon boots at night but  not during the day. She currently denies systemic signs of infection. 3/20; patient presents for follow-up. She again was in the hospital for severe sepsis on 2/24; MRIs at that time showed findings suspicious for osteomyelitis to the right hip. She is currently on Augmentin to complete 4 weeks of this. She is following with ID. She has been using Dakin's wet-to-dry dressings to the sacrum and right hip however the solution is starting to cause discomfort. T the rest of the wound she has been using Hydrofera Blue and Medihoney. Patient o and son are not interested in doing palliative care or hospice. They state that they had discussions while in the hospital. 4/17; patient presents for follow-up. She has been using Vashe wet-to-dry dressings to the sacrum and right trochanter wound. She has been using Medihoney and Hydrofera Blue to the bilateral feet wounds. She has no issues or complaints today. She denies signs of infection. 5/1; patient developed a new wound to the right leg. Son was present and states that there was a scab and he removed it creating a wound. He has been keeping the area covered. T the sacral and right trochanter wound they have been using Vashe wet-to-dry dressings and Medihoney and Hydrofera Blue to the o bilateral feet wounds. She denies signs of infection. 6/1; 1 month follow-up. She has a new area on the left anterior lower leg. In the meantime the sacral ulcer is closed. She still has an extensive wound on the right hip with exposed tendon over the greater trochanter. On the lower extremities the left anterior lower leg wound is new right ankle left heel and right heel all look reasonably stable. We are using Vashe wet-to-dry on  the hip Medihoney and Hydrofera Blue on the lower extremities. She is accompanied by her son I think who is doing the dressings. They do not have a pressure-relief surface at home but they do have a hospital bed 7/10; I have not seen this patient  since 03/13/2023. She was scheduled to be seen on 5/29 however missed her appointment due to illness. She was seen on 6/5 by Dr. Leanord Hawking and she presents today for her follow-up. Unfortunately her wounds have declined since I last saw her. She has several new wounds including one to the left hip wound and one left anterior lower leg. The right hip wound has declined and has maggots with bone exposed. She has been using Vashe wet- to-dry dressings to Right hip wound and Medihoney and Hydrofera Blue to the The remaining wounds. Sacral wound remains closed. 7/24; patient presents for follow-up. At last clinic visit I recommended she go to the ED for potential admission, IV antibiotics and further imaging. She had MRI of the pelvis and left foot that showed osteomyelitis. Podiatry advised conservative management. She was also seen by infectious disease who discharged patient on 2 weeks of linezolid and 2 weeks of ciprofloxacin. Patient is at home and has home health. Palliative care was consulted during the stay and she is full code and has declined further outpatient palliative care and hospice services.. She currently denies systemic signs of infection. Overall there is improvement in the appearance of the wounds. There is healthier granulation tissue present. However this is overall poor prognosis case. She has Prevalon boots but it is unclear if she is using these. She does not have them on today. She follows up with infectious disease next week. 8/7; Patient's been using Vashe wet-to-dry dressing to all the wound beds except for the left hip she has been using Santyl. She followed up with infectious disease on 8/1. Currently she is on linezolid and ciprofloxacin. They recommended lab work to see if she needs extension of her oral antibiotics. She currently denies systemic signs of infection. 8/21; patient presents for follow-up. She has been using Vashe wet-to-dry dressings to all wounds except the left  hip she has been using Santyl. All wounds have improved in size and appearance. She has healthy granulation tissue present to all wounds except the left hip still has nonviable tissue throughout. She states she has completed her oral antibiotics per ID. She currently denies systemic signs of infection. 9/4; patient presents for follow-up. She has been using Vashe wet-to-dry dressings to all wounds except the left hip. T the left hip she has been using Santyl. o All wounds appear well-healing. She currently denies systemic signs of infection. 9/18; 2-week follow-up. This is a very disabled woman who is nonambulatory. 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 She has significant bilateral lower extremity pressure ulcers bilaterally. 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 She is not ambulatory. She has extensive wounds on both trochanters. The right has exposed bone in the left nonviable tissue. I aggressively debrided this the last time she was here. We have been using Santyl on the left hip and Vashe wet-to-dry on the right hip. She has wounds on her bilateral lower legs extensively on the right medial foot first met head ankle and lower leg area on the left lower leg and left  heel. All of these seem somewhat improved again using Vashe wet-to-dry. We have been attempting to get a level 3 surface or some form of offloading surface for the patient although having trouble with their insurance which apparently is [facility] 10/16; patient is doing very well. The wounds on the lower extremity even the extensive areas seem to be a lot better with healthy granulation. We have been using the [facility] equivalent of Vashe wet-to-dry. There is areas over the lateral part of the greater trochanters. The area on the left looks a lot better we have been  using Santyl with backing wet to dry gauze. We have been using Vashe wet-to-dry in the right greater trochanter. I think we are in line with changing the dressing next week to both hip areas We are apparently finally making some inroads in getting her a level 3 surface for her bed 10/23; patient continues to make nice improvement in the bilateral lower leg wounds using the equivalent of Vashe wet-to-dry. I did plan to change this dressing but the wounds are making such a nice improvement I continued it. T oday I have changed the greater trochanter stage IV pressure ulcers to a Prisma and then hydrogel wet-to-dry packing with border foam change every 2 D. Really remarkable improvement credit to the family this looking after her 09-18-2023 upon evaluation today patient appears to be doing more poorly especially in regard to her heels and lower extremities where she has deep tissue injuries on both heels and she has new blisters that have opened up on her legs. This is due to the fact that she is not elevating her legs she is staying in her [name], [name] ([id number]) [id number] Page 11 of 14 wheelchair sitting not moving around throughout the day and this is not the best thing for her to be honest. Fortunately I do not think she is infected right now but at the same time this is still concerning to me. 10-02-2023 upon evaluation today patient appears to be doing really about the same in regard to her wounds. There is not much improvement here unfortunately. I do not see any signs of active infection at this time which is good news. With that being said she still has significant wounds on both hips the left is better than the right she also has many wounds scattered over her legs as well as both heels. Objective Constitutional Well-nourished and well-hydrated in no acute distress. Vitals Time Taken: 4:05 PM, Weight: 98 lbs, Temperature: 98.3 F, Pulse: 43 bpm, Respiratory  Rate: 18 breaths/min, Blood Pressure: 121/61 mmHg. Respiratory normal breathing without difficulty. Psychiatric this patient is able to make decisions and demonstrates good insight into disease process. Alert and Oriented x 3. pleasant and cooperative. General Notes: Upon inspection patient's wound bed actually showed signs of good granulation epithelization at this point. She has a lot of improvement in regard to the legs as far as the overall appearance there is no signs of infection I do not see any indication for antibiotics at this point. With that being said I do believe that the Dakin's moistened gauze to the legs may be actually causing some issues here with sticking and possibly new wounds. I really feel like she may do better with Xeroform gauze we can give that a trial. I also think Betadine to the left heel would be ideal and on the right heel we will use Xeroform as well as the right foot. For both of the hips we will going  be using Dakin's moistened gauze along with collagen. Integumentary (Hair, Skin) Wound #11 status is Open. Original cause of wound was Pressure Injury. The date acquired was: 10/24/2022. The wound has been in treatment 48 weeks. The wound is located on the Right Trochanter. The wound measures 2.8cm length x 1.5cm width x 2cm depth; 3.299cm^2 area and 6.597cm^3 volume. There is bone, muscle, tendon, Fat Layer (Subcutaneous Tissue), and fascia exposed. There is undermining starting at 12:00 and ending at 12:00 with a maximum distance of 1.5cm. There is a medium amount of serosanguineous drainage noted. The wound margin is well defined and not attached to the wound base. There is large (67-100%) red granulation within the wound bed. There is no necrotic tissue within the wound bed. Wound #13 status is Open. Original cause of wound was Pressure Injury. The date acquired was: 11/28/2022. The wound has been in treatment 43 weeks. The wound is located on the Left Calcaneus.  The wound measures 1cm length x 0.1cm width x 0.1cm depth; 0.079cm^2 area and 0.008cm^3 volume. There is Fat Layer (Subcutaneous Tissue) exposed. There is a medium amount of serosanguineous drainage noted. The wound margin is flat and intact. There is medium (34-66%) pink granulation within the wound bed. There is a medium (34-66%) amount of necrotic tissue within the wound bed including Eschar and Adherent Slough. Wound #14 status is Open. Original cause of wound was Gradually Appeared. The date acquired was: 03/10/2023. The wound has been in treatment 29 weeks. The wound is located on the Right,Distal,Medial Lower Leg. The wound measures 2.5cm length x 2.5cm width x 0.1cm depth; 4.909cm^2 area and 0.491cm^3 volume. There is Fat Layer (Subcutaneous Tissue) exposed. There is a large amount of serosanguineous drainage noted. There is large (67-100%) red granulation within the wound bed. There is a small (1-33%) amount of necrotic tissue within the wound bed including Adherent Slough. Wound #15 status is Open. Original cause of wound was Pressure Injury. The date acquired was: 04/11/2023. The wound has been in treatment 24 weeks. The wound is located on the Left,Anterior Lower Leg. The wound measures 8cm length x 7.3cm width x 0.1cm depth; 45.867cm^2 area and 4.587cm^3 volume. There is tendon and Fat Layer (Subcutaneous Tissue) exposed. There is a medium amount of serosanguineous drainage noted. There is no granulation within the wound bed. There is no necrotic tissue within the wound bed. Wound #16 status is Open. Original cause of wound was Pressure Injury. The date acquired was: 05/22/2023. The wound has been in treatment 19 weeks. The wound is located on the Left Trochanter. The wound measures 1.5cm length x 0.9cm width x 0.7cm depth; 1.06cm^2 area and 0.742cm^3 volume. There is Fat Layer (Subcutaneous Tissue) exposed. There is undermining starting at 7:00 and ending at 10:00 with a maximum distance of  1.4cm. There is a medium amount of serosanguineous drainage noted. There is a large (67-100%) amount of necrotic tissue within the wound bed including Eschar and Adherent Slough. Wound #18 status is Open. Original cause of wound was Pressure Injury. The date acquired was: 07/31/2023. The wound has been in treatment 9 weeks. The wound is located on the Right Metatarsal head first. The wound measures 1cm length x 1cm width x 0.1cm depth; 0.785cm^2 area and 0.079cm^3 volume. There is Fat Layer (Subcutaneous Tissue) exposed. There is a medium amount of serosanguineous drainage noted. There is medium (34-66%) red granulation within the wound bed. There is a medium (34-66%) amount of necrotic tissue within the wound bed including Eschar and  Adherent Slough. Wound #19 status is Open. Original cause of wound was Blister. The date acquired was: 09/17/2023. The wound has been in treatment 2 weeks. The wound is located on the Right,Anterior Lower Leg. The wound measures 4.5cm length x 3.8cm width x 0.1cm depth; 13.43cm^2 area and 1.343cm^3 volume. There is Fat Layer (Subcutaneous Tissue) exposed. There is a medium amount of serosanguineous drainage noted. Wound #6 status is Open. Original cause of wound was Pressure Injury. The date acquired was: 07/23/2022. The wound has been in treatment 53 weeks. The wound is located on the Right Calcaneus. The wound measures 2.4cm length x 2.7cm width x 0.1cm depth; 5.089cm^2 area and 0.509cm^3 volume. There is Fat Layer (Subcutaneous Tissue) exposed. There is a medium amount of serosanguineous drainage noted. The wound margin is distinct with the outline attached to the wound base. There is medium (34-66%) red, pink granulation within the wound bed. There is a medium (34-66%) amount of necrotic tissue within the wound bed including Eschar and Adherent Slough. Assessment Active Problems ICD-10 Non-pressure chronic ulcer of other part of left lower leg with fat layer  exposed Non-pressure chronic ulcer of other part of right lower leg with fat layer exposed Chronic venous hypertension (idiopathic) with ulcer of left lower extremity Lymphedema, not elsewhere classified Ramus, Moriya [name] ([id number]) [id number] Page 12 of 14 Type 2 diabetes mellitus with other skin ulcer Pressure ulcer of right heel, unstageable Pressure ulcer of right ankle, stage 3 Pressure ulcer of left heel, unstageable Other acute osteomyelitis, left ankle and foot Pressure ulcer of right hip, stage 4 Pressure ulcer of left hip, unstageable Chronic diastolic (congestive) heart failure Long term (current) use of oral hypoglycemic drugs Plan Follow-up Appointments: Return Appointment in 3 weeks. Home Health: Home Health Company: - Harlin Rain, RN [phone] [facility] for wound care. May utilize formulary equivalent dressing for wound treatment orders unless otherwise specified. Home Health Nurse may visit PRN to address patients wound care needs. Off-Loading: Low air-loss mattress (Group 2) Devices ordered were: Bed- Mattress-Overlay or Replacement - Group 2 air mattress WOUND #11: - Trochanter Wound Laterality: Right Cleanser: Vashe 5.8 (oz) 1 x Per Day/30 Days Discharge Instructions: Use as directed for cleaning wound Prim Dressing: Prisma 4.34 (in) 1 x Per Day/30 Days ary Discharge Instructions: pack into tunnel and wound Secondary Dressing: Gauze 1 x Per Day/30 Days Discharge Instructions: moisten with Vashe and place over collagen Secondary Dressing: (BORDER) Zetuvit Plus SILICONE BORDER Dressing 5x5 (in/in) 1 x Per Day/30 Days Discharge Instructions: Please do not put silicone bordered dressings under wraps. Use non-bordered dressing only. WOUND #13: - Calcaneus Wound Laterality: Left Cleanser: Vashe 5.8 (oz) (Home Health) 1 x Per Day/30 Days Discharge Instructions: damp to dry daily Topical: Betadine 1 x Per Day/30  Days Discharge Instructions: Apply betadine to heel Secondary Dressing: ABD Pad 5x9 (in/in) 1 x Per Day/30 Days Discharge Instructions: Cover with ABD pad Secondary Dressing: Kerlix 4.5 x 4.1 (in/yd) 1 x Per Day/30 Days Discharge Instructions: Apply Kerlix 4.5 x 4.1 (in/yd) as instructed Secured With: ACE WRAP - 5M ACE Elastic Bandage With VELCRO Brand Closure, 4 (in) 1 x Per Day/30 Days WOUND #14: - Lower Leg Wound Laterality: Right, Medial, Distal Cleanser: Vashe 5.8 (oz) (Home Health) 1 x Per Day/30 Days Discharge Instructions: damp to dry daily Prim Dressing: Xeroform 5x9-HBD (in/in) 1 x Per Day/30 Days ary Discharge Instructions: Apply Xeroform 5x9-HBD (in/in) as directed Secondary Dressing: ABD Pad 5x9 (in/in) 1 x Per Day/30 Days Discharge  Instructions: Cover with ABD pad Secondary Dressing: Kerlix 4.5 x 4.1 (in/yd) 1 x Per Day/30 Days Discharge Instructions: Apply Kerlix 4.5 x 4.1 (in/yd) as instructed Secured With: ACE WRAP - 50M ACE Elastic Bandage With VELCRO Brand Closure, 4 (in) 1 x Per Day/30 Days WOUND #15: - Lower Leg Wound Laterality: Left, Anterior Cleanser: Vashe 5.8 (oz) (Home Health) 1 x Per Day/30 Days Discharge Instructions: damp to dry daily Prim Dressing: Xeroform 5x9-HBD (in/in) 1 x Per Day/30 Days ary Discharge Instructions: Apply Xeroform 5x9-HBD (in/in) as directed Secondary Dressing: ABD Pad 5x9 (in/in) 1 x Per Day/30 Days Discharge Instructions: Cover with ABD pad Secondary Dressing: Kerlix 4.5 x 4.1 (in/yd) 1 x Per Day/30 Days Discharge Instructions: Apply Kerlix 4.5 x 4.1 (in/yd) as instructed Secured With: ACE WRAP - 50M ACE Elastic Bandage With VELCRO Brand Closure, 4 (in) 1 x Per Day/30 Days WOUND #16: - Trochanter Wound Laterality: Left Cleanser: Vashe 5.8 (oz) 1 x Per Day/30 Days Discharge Instructions: Use as directed for cleaning wound Prim Dressing: Prisma 4.34 (in) 1 x Per Day/30 Days ary Discharge Instructions: pack into tunnel and  wound Secondary Dressing: Gauze 1 x Per Day/30 Days Discharge Instructions: moisten with Vashe and place over collagen Secondary Dressing: (BORDER) Zetuvit Plus SILICONE BORDER Dressing 5x5 (in/in) 1 x Per Day/30 Days Discharge Instructions: Please do not put silicone bordered dressings under wraps. Use non-bordered dressing only. WOUND #18: - Metatarsal head first Wound Laterality: Right Cleanser: Vashe 5.8 (oz) (Home Health) 1 x Per Day/30 Days Discharge Instructions: damp to dry daily Prim Dressing: Xeroform 5x9-HBD (in/in) 1 x Per Day/30 Days ary Discharge Instructions: Apply Xeroform 5x9-HBD (in/in) as directed Secondary Dressing: ABD Pad 5x9 (in/in) 1 x Per Day/30 Days Discharge Instructions: Cover with ABD pad Secondary Dressing: Kerlix 4.5 x 4.1 (in/yd) 1 x Per Day/30 Days Discharge Instructions: Apply Kerlix 4.5 x 4.1 (in/yd) as instructed Secured With: ACE WRAP - 50M ACE Elastic Bandage With VELCRO Brand Closure, 4 (in) 1 x Per Day/30 Days WOUND #19: - Lower Leg Wound Laterality: Right, Anterior Cleanser: Vashe 5.8 (oz) (Home Health) 1 x Per Day/30 Days Discharge Instructions: damp to dry daily Prim Dressing: Xeroform 5x9-HBD (in/in) 1 x Per Day/30 Days ary Discharge Instructions: Apply Xeroform 5x9-HBD (in/in) as directed Secondary Dressing: ABD Pad 5x9 (in/in) 1 x Per Day/30 Days Fristoe, [name] ([id number]) [id number] Page 13 of 14 Discharge Instructions: Cover with ABD pad Secondary Dressing: Kerlix 4.5 x 4.1 (in/yd) 1 x Per Day/30 Days Discharge Instructions: Apply Kerlix 4.5 x 4.1 (in/yd) as instructed Secured With: ACE WRAP - 50M ACE Elastic Bandage With VELCRO Brand Closure, 4 (in) 1 x Per Day/30 Days WOUND #6: - Calcaneus Wound Laterality: Right Cleanser: Vashe 5.8 (oz) (Home Health) 1 x Per Day/30 Days Discharge Instructions: damp to dry daily Prim Dressing: Xeroform 5x9-HBD (in/in) 1 x Per Day/30 Days ary Discharge Instructions:  Apply Xeroform 5x9-HBD (in/in) as directed Secondary Dressing: ABD Pad 5x9 (in/in) 1 x Per Day/30 Days Discharge Instructions: Cover with ABD pad Secondary Dressing: Kerlix 4.5 x 4.1 (in/yd) 1 x Per Day/30 Days Discharge Instructions: Apply Kerlix 4.5 x 4.1 (in/yd) as instructed Secured With: ACE WRAP - 50M ACE Elastic Bandage With VELCRO Brand Closure, 4 (in) 1 x Per Day/30 Days 1. Based on what I am seeing I do believe that the patient would benefit from a continuation of therapy with regard to the wound care measures as before. 2. I am going to recommend that we actually use  Xeroform gauze for majority the wounds over lower extremities 1 exception is gena be the left heel where using Betadine and just an ABD pad. 3. We can use collagen and saline or Vashe moistened gauze to pack into the hip wounds. 4. I am going to recommend continuing appropriate offloading which I think is still of utmost importance. 5. I am going to recommend that we go ahead and see about getting the patient a group 2 mattress in order to try to help with offloading in regard to the hips. In particular both of these are stage IV pressure ulcers and again subsequently are going require aggressive offloading I think that the group to air mattress would be crucial as far as helping out with this is concerned. The patient and her son are in agreement with that plan. We will see patient back for reevaluation in 1 week here in the clinic. If anything worsens or changes patient will contact our office for additional recommendations. Electronic Signature(s) Signed: 10/02/2023 5:30:21 PM By: Allen Derry PA-C Previous Signature: 10/02/2023 5:19:45 PM Version By: Allen Derry PA-C Entered By: Allen Derry on 10/02/2023 17:30:21 -------------------------------------------------------------------------------- SuperBill Details Patient Name: Date of Service: [facility], [name] 10/02/2023 Medical Record Number: [id number] Patient  Account Number: [id number] Date of Birth/Sex: Treating RN: 04/29/1941 (82 y.o. Freddy [name] Primary Care Provider: Barbette Reichmann Other Clinician: Betha Loa Referring Provider: Treating Provider/Extender: Jearld Shines Weeks in Treatment: 48 Diagnosis Coding ICD-10 Codes Code Description [phone] Non-pressure chronic ulcer of other part of left lower leg with fat layer exposed L97.812 Non-pressure chronic ulcer of other part of right lower leg with fat layer exposed I87.312 Chronic venous hypertension (idiopathic) with ulcer of left lower extremity I89.0 Lymphedema, not elsewhere classified E11.622 Type 2 diabetes mellitus with other skin ulcer L89.610 Pressure ulcer of right heel, unstageable L89.513 Pressure ulcer of right ankle, stage 3 L89.620 Pressure ulcer of left heel, unstageable M86.172 Other acute osteomyelitis, left ankle and foot L89.214 Pressure ulcer of right hip, stage 4 L89.220 Pressure ulcer of left hip, unstageable I50.32 Chronic diastolic (congestive) heart failure Z79.84 Long term (current) use of oral hypoglycemic drugs Hartel, Trishelle [name] ([id number]) [id number] Page 14 of 14 Facility Procedures : CPT4 Code: [id number] Description: [phone] - WOUND CARE VISIT-LEV 5 EST PT Modifier: Quantity: 1 Physician Procedures : CPT4 Code Description Modifier [id number] - WC PHYS LEVEL 4 - EST PT ICD-10 Diagnosis Description L97.822 Non-pressure chronic ulcer of other part of left lower leg with fat layer exposed L97.812 Non-pressure chronic ulcer of other part of right  lower leg with fat layer exposed I87.312 Chronic venous hypertension (idiopathic) with ulcer of left lower extremity I89.0 Lymphedema, not elsewhere classified Quantity: 1 Electronic Signature(s) Signed: 10/03/2023 1:29:03 PM By: Allen Derry PA-C Signed: 10/03/2023 4:53:18 PM By: Betha Loa Previous Signature: 10/02/2023 5:20:19 PM Version By:  Allen Derry PA-C Entered By: Betha Loa on 10/02/2023 17:31:36

## 2023-10-02 NOTE — Progress Notes (Signed)
[name], [name] ([id number]) [id number] Page 1 of 19 Visit Report for 10/02/2023 Arrival Information Details Patient Name: Date of Service: [facility], [name]. 10/02/2023 3:30 PM Medical Record Number: [id number] Patient Account Number: [id number] Date of Birth/Sex: Treating RN: [birth year] (82 y.o. [name] Primary Care Kelsy Polack: Barbette Reichmann Other Clinician: Betha Loa Referring Dareth Andrew: Treating Angeline Trick/Extender: Jearld Shines Weeks in Treatment: 14 Visit Information History Since Last Visit All ordered tests and consults were completed: No Patient Arrived: Wheel Chair Added or deleted any medications: No Arrival Time: 16:02 Any new allergies or adverse reactions: No Transfer Assistance: EasyPivot Patient Lift Had a fall or experienced change in No Patient Identification Verified: Yes activities of daily living that may affect Secondary Verification Process Completed: Yes risk of falls: Patient Requires Transmission-Based Precautions: No Signs or symptoms of abuse/neglect since last visito No Patient Has Alerts: Yes Hospitalized since last visit: No Patient Alerts: DM II Implantable device outside of the clinic excluding No ABI R 1.30 TBI 1.12 cellular tissue based products placed in the center ABI [name] 1.27 TBI 1.21 since last visit: Has Dressing in Place as Prescribed: Yes Pain Present Now: Yes Electronic Signature(s) Unsigned Entered By: Betha Loa on 10/02/2023 13:03:19 -------------------------------------------------------------------------------- Clinic Level of Care Assessment Details Patient Name: Date of Service: [facility], [name]. 10/02/2023 3:30 PM Medical Record Number: [id number] Patient Account Number: [id number] Date of Birth/Sex: Treating RN: 11/10/1941 (81 y.o. [name] Primary Care Hisae Decoursey: Barbette Reichmann Other Clinician: Betha Loa Referring Geanette Buonocore: Treating  Constantin Hillery/Extender: Jearld Shines Weeks in Treatment: 60 Clinic Level of Care Assessment Items TOOL 4 Quantity Score []  - 0 Use when only an EandM is performed on FOLLOW-UP visit ASSESSMENTS - Nursing Assessment / Reassessment X- 1 10 Reassessment of Co-morbidities (includes updates in patient status) [name], [name] ([id number]) [id number] Page 2 of 19 X- 1 5 Reassessment of Adherence to Treatment Plan ASSESSMENTS - Wound and Skin A ssessment / Reassessment []  - 0 Simple Wound Assessment / Reassessment - one wound X- 8 5 Complex Wound Assessment / Reassessment - multiple wounds []  - 0 Dermatologic / Skin Assessment (not related to wound area) ASSESSMENTS - Focused Assessment []  - 0 Circumferential Edema Measurements - multi extremities []  - 0 Nutritional Assessment / Counseling / Intervention []  - 0 Lower Extremity Assessment (monofilament, tuning fork, pulses) []  - 0 Peripheral Arterial Disease Assessment (using hand held doppler) ASSESSMENTS - Ostomy and/or Continence Assessment and Care []  - 0 Incontinence Assessment and Management []  - 0 Ostomy Care Assessment and Management (repouching, etc.) PROCESS - Coordination of Care X - Simple Patient / Family Education for ongoing care 1 15 []  - 0 Complex (extensive) Patient / Family Education for ongoing care []  - 0 Staff obtains Chiropractor, Records, T Results / Process Orders est []  - 0 Staff telephones HHA, Nursing Homes / Clarify orders / etc []  - 0 Routine Transfer to another Facility (non-emergent condition) []  - 0 Routine Hospital Admission (non-emergent condition) []  - 0 New Admissions / Manufacturing engineer / Ordering NPWT Apligraf, etc. , []  - 0 Emergency Hospital Admission (emergent condition) X- 1 10 Simple Discharge Coordination []  - 0 Complex (extensive) Discharge Coordination PROCESS - Special Needs []  - 0 Pediatric / Minor Patient Management []  -  0 Isolation Patient Management []  - 0 Hearing / Language / Visual special needs []  - 0 Assessment of Community assistance (transportation, D/C planning, etc.) []  - 0 Additional assistance / Altered mentation []  -  0 Support Surface(s) Assessment (bed, cushion, seat, etc.) INTERVENTIONS - Wound Cleansing / Measurement []  - 0 Simple Wound Cleansing - one wound X- 8 5 Complex Wound Cleansing - multiple wounds X- 1 5 Wound Imaging (photographs - any number of wounds) []  - 0 Wound Tracing (instead of photographs) []  - 0 Simple Wound Measurement - one wound X- 8 5 Complex Wound Measurement - multiple wounds INTERVENTIONS - Wound Dressings X - Small Wound Dressing one or multiple wounds 8 10 []  - 0 Medium Wound Dressing one or multiple wounds []  - 0 Large Wound Dressing one or multiple wounds []  - 0 Application of Medications - topical []  - 0 Application of Medications - injection [name], [name] ([id number]) [id number] Page 3 of 19 INTERVENTIONS - Miscellaneous []  - 0 External ear exam []  - 0 Specimen Collection (cultures, biopsies, blood, body fluids, etc.) []  - 0 Specimen(s) / Culture(s) sent or taken to Lab for analysis []  - 0 Patient Transfer (multiple staff / Michiel Sites Lift / Similar devices) []  - 0 Simple Staple / Suture removal (25 or less) []  - 0 Complex Staple / Suture removal (26 or more) []  - 0 Hypo / Hyperglycemic Management (close monitor of Blood Glucose) []  - 0 Ankle / Brachial Index (ABI) - do not check if billed separately X- 1 5 Vital Signs Has the patient been seen at the hospital within the last three years: Yes Total Score: 250 Level Of Care: New/Established - Level 5 Electronic Signature(s) Unsigned Entered By: Betha Loa on 10/02/2023 14:31:21 -------------------------------------------------------------------------------- Encounter Discharge Information Details Patient Name: Date of Service: [facility], [name]  [name]. 10/02/2023 3:30 PM Medical Record Number: [id number] Patient Account Number: [id number] Date of Birth/Sex: Treating RN: 26-Feb-1941 (82 y.o. [name] Primary Care Olis Viverette: Barbette Reichmann Other Clinician: Betha Loa Referring Zuriah Bordas: Treating Lennox Leikam/Extender: Jearld Shines Weeks in Treatment: 40 Encounter Discharge Information Items Discharge Condition: Stable Ambulatory Status: Wheelchair Discharge Destination: Home Transportation: Private Auto Accompanied By: son Schedule Follow-up Appointment: Yes Clinical Summary of Care: Electronic Signature(s) Unsigned Entered By: Betha Loa on 10/02/2023 14:32:58 Signature(s): JEDIDAH, ALBACH ([id number]) [phone] Date(s): [id number] Page 4 of 19 -------------------------------------------------------------------------------- Lower Extremity Assessment Details Patient Name: Date of Service: [facility], [name]. 10/02/2023 3:30 PM Medical Record Number: [id number] Patient Account Number: [id number] Date of Birth/Sex: Treating RN: 05-Aug-1941 (82 y.o. [name] Primary Care Murle Otting: Barbette Reichmann Other Clinician: Betha Loa Referring Keyan Folson: Treating Herchel Hopkin/Extender: Jearld Shines Weeks in Treatment: 47 Electronic Signature(s) Signed: 10/02/2023 4:41:11 PM By: Yevonne Pax RN Entered By: Betha Loa on 10/02/2023 13:37:29 -------------------------------------------------------------------------------- Multi Wound Chart Details Patient Name: Date of Service: [facility], [name]. 10/02/2023 3:30 PM Medical Record Number: [id number] Patient Account Number: [id number] Date of Birth/Sex: Treating RN: [birth year] (81 y.o. [name] Primary Care Nakima Fluegge: Barbette Reichmann Other Clinician: Betha Loa Referring Yolandra Habig: Treating Zaccai Chavarin/Extender: Jearld Shines Weeks in Treatment: 21 Vital  Signs Height(in): Pulse(bpm): 43 Weight(lbs): 98 Blood Pressure(mmHg): 121/61 Body Mass Index(BMI): Temperature(F): 98.3 Respiratory Rate(breaths/min): 18 [11:Photos:] Right Trochanter Left Calcaneus Right, Distal, Medial Lower Leg Wound Location: Pressure Injury Pressure Injury Gradually Appeared Wounding Event: Pressure Ulcer Pressure Ulcer Diabetic Wound/Ulcer of the Lower Primary Etiology: Extremity Cataracts, Lymphedema, Cataracts, Lymphedema, Cataracts, Lymphedema, Comorbid History: Hypertension, Peripheral Arterial Hypertension, Peripheral Arterial Hypertension, Peripheral Arterial Disease, Peripheral Venous Disease, Disease, Peripheral Venous Disease, Disease, Peripheral Venous Disease, Type II Diabetes, Osteoarthritis, Type II Diabetes, Osteoarthritis, Type II Diabetes, Osteoarthritis, Neuropathy Neuropathy Neuropathy 10/24/2022  11/28/2022 03/10/2023 Date Acquired: KYNZLEIGH, CAVAN ([id number]) [id number] Page 5 of 19 48 43 29 Weeks of Treatment: Open Open Open Wound Status: No No No Wound Recurrence: No No Yes Clustered Wound: 2.8x1.5x2 1x0.1x0.1 2.5x2.5x0.1 Measurements [name] x W x D (cm) 3.299 0.079 4.909 A (cm) : rea 6.597 0.008 0.491 Volume (cm) : -59.10% 97.70% 10.70% % Reduction in A rea: -3087.00% 97.70% 10.70% % Reduction in Volume: 12 Starting Position 1 (o'clock): 12 Ending Position 1 (o'clock): 1.5 Maximum Distance 1 (cm): Yes N/A N/A Undermining: Category/Stage IV Category/Stage III Grade 2 Classification: Medium Medium Large Exudate A mount: Serosanguineous Serosanguineous Serosanguineous Exudate Type: red, brown red, brown red, brown Exudate Color: Well defined, not attached Flat and Intact N/A Wound Margin: Large (67-100%) Medium (34-66%) Large (67-100%) Granulation A mount: Red Pink Red Granulation Quality: None Present (0%) Medium (34-66%) Small (1-33%) Necrotic A mount: N/A Eschar, Adherent Slough  Adherent Slough Necrotic Tissue: Fascia: Yes Fat Layer (Subcutaneous Tissue): Yes Fat Layer (Subcutaneous Tissue): Yes Exposed Structures: Fat Layer (Subcutaneous Tissue): Yes Fascia: No Fascia: No Tendon: Yes Tendon: No Tendon: No Muscle: Yes Muscle: No Muscle: No Bone: Yes Joint: No Joint: No Joint: No Bone: No Bone: No None None None Epithelialization: Wound Number: [id number]  Photos: Left, Anterior Lower Leg Left Trochanter Right Metatarsal head first Wound Location: Pressure Injury Pressure Injury Pressure Injury Wounding Event: Pressure Ulcer Pressure Ulcer Pressure Ulcer Primary Etiology: Cataracts, Lymphedema, Cataracts, Lymphedema, Cataracts, Lymphedema, Comorbid History: Hypertension, Peripheral Arterial Hypertension, Peripheral Arterial Hypertension, Peripheral Arterial Disease, Peripheral Venous Disease, Disease, Peripheral Venous Disease, Disease, Peripheral Venous Disease, Type II Diabetes, Osteoarthritis, Type II Diabetes, Osteoarthritis, Type II Diabetes, Osteoarthritis, Neuropathy Neuropathy Neuropathy 04/11/2023 05/22/2023 07/31/2023 Date Acquired: 24 19 9  Weeks of Treatment: Open Open Open Wound Status: No No No Wound Recurrence: No No No Clustered Wound: 8x7.3x0.1 1.5x0.9x0.7 1x1x0.1 Measurements [name] x W x D (cm) 45.867 1.06 0.785 A (cm) : rea 4.587 0.742 0.079 Volume (cm) : -104.50% 67.40% -5.20% % Reduction in A rea: -104.50% -128.30% -5.30% % Reduction in Volume: 7 Starting Position 1 (o'clock): 10 Ending Position 1 (o'clock): 1.4 Maximum Distance 1 (cm): N/A Yes N/A Undermining: Category/Stage IV Unstageable/Unclassified Category/Stage II Classification: Medium Medium Medium Exudate A mount: Serosanguineous Serosanguineous Serosanguineous Exudate Type: red, brown red, brown red, brown Exudate Color: N/A N/A N/A Wound Margin: None Present (0%) N/A Medium (34-66%) Granulation A mount: N/A N/A Red Granulation Quality: None  Present (0%) N/A Medium (34-66%) Necrotic A mount: N/A Eschar, Adherent Slough Eschar, Adherent Slough Necrotic Tissue: Fat Layer (Subcutaneous Tissue): Yes Fat Layer (Subcutaneous Tissue): Yes Fat Layer (Subcutaneous Tissue): Yes Exposed Structures: Tendon: Yes Fascia: No Fascia: No Fascia: No Tendon: No Tendon: No Muscle: No Muscle: No Muscle: No Joint: No Joint: No Joint: No Bone: No Bone: No Bone: No Large (67-100%) N/A None Epithelialization: Wound Number: [id number] N/A Photos: N/A ALEERA, BINKOWSKI [name] ([id number]) [id number] Page 6 of 19 Right, Anterior Lower Leg Right Calcaneus N/A Wound Location: Blister Pressure Injury N/A Wounding Event: Venous Leg Ulcer Pressure Ulcer N/A Primary Etiology: Cataracts, Lymphedema, Cataracts, Lymphedema, N/A Comorbid History: Hypertension, Peripheral Arterial Hypertension, Peripheral Arterial Disease, Peripheral Venous Disease, Disease, Peripheral Venous Disease, Type II Diabetes, Osteoarthritis, Type II Diabetes, Osteoarthritis, Neuropathy Neuropathy 09/17/2023 07/23/2022 N/A Date Acquired: 2 38 N/A Weeks of Treatment: Open Open N/A Wound Status: No No N/A Wound Recurrence: Yes No N/A Clustered Wound: 4.5x3.8x0.1 2.4x2.7x0.1 N/A Measurements [name] x W x D (cm) 13.43 5.089 N/A A (cm) : rea  1.343 0.509 N/A Volume (cm) : -90.00% 46.00% N/A % Reduction in Area: -90.00% 82.00% N/A % Reduction in Volume: N/A N/A N/A Undermining: Full Thickness Without Exposed Category/Stage III N/A Classification: Support Structures Medium Medium N/A Exudate A mount: Serosanguineous Serosanguineous N/A Exudate Type: red, brown red, brown N/A Exudate Color: N/A Distinct, outline attached N/A Wound Margin: N/A Medium (34-66%) N/A Granulation Amount: N/A Red, Pink N/A Granulation Quality: N/A Medium (34-66%) N/A Necrotic Amount: N/A Eschar, Adherent Slough N/A Necrotic Tissue: Fat Layer (Subcutaneous  Tissue): Yes Fat Layer (Subcutaneous Tissue): Yes N/A Exposed Structures: Fascia: No Fascia: No Tendon: No Tendon: No Muscle: No Muscle: No Joint: No Joint: No Bone: No Bone: No None None N/A Epithelialization: Treatment Notes Electronic Signature(s) Unsigned Entered ByBetha Loa on 10/02/2023 13:37:33 -------------------------------------------------------------------------------- Multi-Disciplinary Care Plan Details Patient Name: Date of Service: [facility], [name]. 10/02/2023 3:30 PM Medical Record Number: [id number] Patient Account Number: [id number] Date of Birth/Sex: Treating RN: July 09, 1941 (82 y.o. [name] Primary Care Shalin Vonbargen: Barbette Reichmann Other Clinician: Betha Loa Referring Evalene Vath: Treating Thai Burgueno/Extender: Jearld Shines Weeks in Treatment: [address] Inactive Electronic Signature(s) BENICIA, BIRMAN ([id number]) [id number] Page 7 of 19 Unsigned Entered By: Betha Loa on 10/02/2023 14:31:41 -------------------------------------------------------------------------------- Pain Assessment Details Patient Name: Date of Service: [facility], [name]. 10/02/2023 3:30 PM Medical Record Number: [id number] Patient Account Number: [id number] Date of Birth/Sex: Treating RN: 08-12-1941 (82 y.o. [name] Primary Care Nylan Nakatani: Barbette Reichmann Other Clinician: Betha Loa Referring Elverda Wendel: Treating Alayshia Marini/Extender: Jearld Shines Weeks in Treatment: 33 Active Problems Location of Pain Severity and Description of Pain Patient Has Paino Yes Site Locations Pain Location: Pain in Ulcers Duration of the Pain. Constant / Intermittento Constant Rate the pain. Current Pain Level: 7 Character of Pain Describe the Pain: Aching Pain Management and Medication Current Pain Management: Medication: Yes Cold Application: No Rest: No Massage: No Activity:  No T.E.N.S.: No Heat Application: No Leg drop or elevation: No Is the Current Pain Management Adequate: Inadequate How does your wound impact your activities of daily livingo Sleep: No Bathing: No Appetite: No Relationship With Others: No Bladder Continence: No Emotions: No Bowel Continence: No Work: No Toileting: No Drive: No Dressing: No Hobbies: No Electronic Signature(s) Signed: 10/02/2023 4:41:11 PM By: Yevonne Pax RN Entered By: Betha Loa on 10/02/2023 13:28:41 [name], [name] ([id number]) [id number] Page 8 of 19 -------------------------------------------------------------------------------- Patient/Caregiver Education Details Patient Name: Date of Service: [facility], [name]. 11/20/2024andnbsp3:30 PM Medical Record Number: [id number] Patient Account Number: [id number] Date of Birth/Gender: Treating RN: 07-Jun-1941 (82 y.o. [name] Primary Care Physician: Barbette Reichmann Other Clinician: Betha Loa Referring Physician: Treating Physician/Extender: Jearld Shines Weeks in Treatment: 13 Education Assessment Education Provided To: Patient and Caregiver Education Topics Provided Wound/Skin Impairment: Handouts: Other: continue wound care as directed Methods: Explain/Verbal Responses: State content correctly Electronic Signature(s) Unsigned Entered By: Betha Loa on 10/02/2023 14:32:06 -------------------------------------------------------------------------------- Wound Assessment Details Patient Name: Date of Service: [facility], [name]. 10/02/2023 3:30 PM Medical Record Number: [id number] Patient Account Number: [id number] Date of Birth/Sex: Treating RN: 1941/01/04 (81 y.o. [name] Primary Care Yasheka Fossett: Barbette Reichmann Other Clinician: Betha Loa Referring Leondre Taul: Treating Kalilah Barua/Extender: Jearld Shines Weeks in Treatment: 58 Wound Status Wound  Number: 11 Primary Pressure Ulcer Etiology: Wound Location: Right Trochanter Wound Open Wounding Event: Pressure Injury Status: Date Acquired: 10/24/2022 Comorbid Cataracts, Lymphedema, Hypertension, Peripheral Arterial Disease, Weeks Of Treatment: 48 History: Peripheral Venous  Disease, Type II Diabetes, Osteoarthritis, Clustered Wound: No Neuropathy Photos [name], [name] ([id number]) [id number] Page 9 of 19 Wound Measurements Length: (cm) 2.8 Width: (cm) 1.5 Depth: (cm) 2 Area: (cm) 3.299 Volume: (cm) 6.597 % Reduction in Area: -59.1% % Reduction in Volume: -3087% Epithelialization: None Undermining: Yes Starting Position (o'clock): 12 Ending Position (o'clock): 12 Maximum Distance: (cm) 1.5 Wound Description Classification: Category/Stage IV Wound Margin: Well defined, not attached Exudate Amount: Medium Exudate Type: Serosanguineous Exudate Color: red, brown Foul Odor After Cleansing: No Slough/Fibrino Yes Wound Bed Granulation Amount: Large (67-100%) Exposed Structure Granulation Quality: Red Fascia Exposed: Yes Necrotic Amount: None Present (0%) Fat Layer (Subcutaneous Tissue) Exposed: Yes Tendon Exposed: Yes Muscle Exposed: Yes Necrosis of Muscle: No Joint Exposed: No Bone Exposed: Yes Treatment Notes Wound #11 (Trochanter) Wound Laterality: Right Cleanser Vashe 5.8 (oz) Discharge Instruction: Use as directed for cleaning wound Peri-Wound Care Topical Primary Dressing Prisma 4.34 (in) Discharge Instruction: pack into tunnel and wound Secondary Dressing Gauze Discharge Instruction: moisten with Vashe and place over collagen (BORDER) Zetuvit Plus SILICONE BORDER Dressing 5x5 (in/in) Discharge Instruction: Please do not put silicone bordered dressings under wraps. Use non-bordered dressing only. Secured With Compression Wrap Compression Stockings Facilities manager) Signed: 10/02/2023 4:41:11 PM By: Yevonne Pax RN Entered By: Betha Loa on 10/02/2023 13:29:29 Nicola Police ([id number]) [id number] Page 10 of 19 -------------------------------------------------------------------------------- Wound Assessment Details Patient Name: Date of Service: [facility], [name]. 10/02/2023 3:30 PM Medical Record Number: [id number] Patient Account Number: [id number] Date of Birth/Sex: Treating RN: 1941/06/10 (82 y.o. [name] Primary Care Charlynn Salih: Barbette Reichmann Other Clinician: Betha Loa Referring Mike Berntsen: Treating Zohar Maroney/Extender: Jearld Shines Weeks in Treatment: 21 Wound Status Wound Number: 13 Primary Pressure Ulcer Etiology: Wound Location: Left Calcaneus Wound Open Wounding Event: Pressure Injury Status: Date Acquired: 11/28/2022 Comorbid Cataracts, Lymphedema, Hypertension, Peripheral Arterial Disease, Weeks Of Treatment: 43 History: Peripheral Venous Disease, Type II Diabetes, Osteoarthritis, Clustered Wound: No Neuropathy Photos Wound Measurements Length: (cm) Width: (cm) Depth: (cm) Area: (cm) Volume: (cm) 1 % Reduction in Area: 97.7% 0.1 % Reduction in Volume: 97.7% 0.1 Epithelialization: None 0.079 0.008 Wound Description Classification: Category/Stage III Wound Margin: Flat and Intact Exudate Amount: Medium Exudate Type: Serosanguineous Exudate Color: red, brown Foul Odor After Cleansing: No Slough/Fibrino Yes Wound Bed Granulation Amount: Medium (34-66%) Exposed Structure Granulation Quality: Pink Fascia Exposed: No Necrotic Amount: Medium (34-66%) Fat Layer (Subcutaneous Tissue) Exposed: Yes Necrotic Quality: Eschar, Adherent Slough Tendon Exposed: No Muscle Exposed: No Joint Exposed: No Bone Exposed: No Treatment Notes Wound #13 (Calcaneus) Wound Laterality: Left Cleanser Vashe 5.8 (oz) Discharge Instruction: damp to dry daily [name], [name] ([id number])  [id number] Page 11 of 19 Peri-Wound Care Topical Betadine Discharge Instruction: Apply betadine to heel Primary Dressing Secondary Dressing ABD Pad 5x9 (in/in) Discharge Instruction: Cover with ABD pad Kerlix 4.5 x 4.1 (in/yd) Discharge Instruction: Apply Kerlix 4.5 x 4.1 (in/yd) as instructed Secured With ACE WRAP - 31M ACE Elastic Bandage With VELCRO Brand Closure, 4 (in) Compression Wrap Compression Stockings Add-Ons Electronic Signature(s) Signed: 10/02/2023 4:41:11 PM By: Yevonne Pax RN Entered By: Betha Loa on 10/02/2023 13:29:58 -------------------------------------------------------------------------------- Wound Assessment Details Patient Name: Date of Service: [facility], [name]. 10/02/2023 3:30 PM Medical Record Number: [id number] Patient Account Number: [id number] Date of Birth/Sex: Treating RN: Sep 15, 1941 (82 y.o. [name] Primary Care Sanayah Munro: Barbette Reichmann Other Clinician: Betha Loa Referring Tajai Ihde: Treating Delontae Lamm/Extender: Jearld Shines Weeks in Treatment: 20 Wound Status Wound  Number: 14 Primary Diabetic Wound/Ulcer of the Lower Extremity Etiology: Wound Location: Right, Distal, Medial Lower Leg Wound Open Wounding Event: Gradually Appeared Status: Date Acquired: 03/10/2023 Comorbid Cataracts, Lymphedema, Hypertension, Peripheral Arterial Disease, Weeks Of Treatment: 29 History: Peripheral Venous Disease, Type II Diabetes, Osteoarthritis, Clustered Wound: Yes Neuropathy Photos Wound Measurements Length: (cm) 2.5 [name], [name] ([id number]) Width: (cm) 2.5 Depth: (cm) 0.1 Area: (cm) 4.909 Volume: (cm) 0.491 % Reduction in Area: 10.7% [id number] Page 12 of 19 % Reduction in Volume: 10.7% Epithelialization: None Wound Description Classification: Grade 2 Exudate Amount: Large Exudate Type: Serosanguineous Exudate Color: red, brown Foul Odor  After Cleansing: No Slough/Fibrino Yes Wound Bed Granulation Amount: Large (67-100%) Exposed Structure Granulation Quality: Red Fascia Exposed: No Necrotic Amount: Small (1-33%) Fat Layer (Subcutaneous Tissue) Exposed: Yes Necrotic Quality: Adherent Slough Tendon Exposed: No Muscle Exposed: No Joint Exposed: No Bone Exposed: No Treatment Notes Wound #14 (Lower Leg) Wound Laterality: Right, Medial, Distal Cleanser Vashe 5.8 (oz) Discharge Instruction: damp to dry daily Peri-Wound Care Topical Primary Dressing Xeroform 5x9-HBD (in/in) Discharge Instruction: Apply Xeroform 5x9-HBD (in/in) as directed Secondary Dressing ABD Pad 5x9 (in/in) Discharge Instruction: Cover with ABD pad Kerlix 4.5 x 4.1 (in/yd) Discharge Instruction: Apply Kerlix 4.5 x 4.1 (in/yd) as instructed Secured With ACE WRAP - 109M ACE Elastic Bandage With VELCRO Brand Closure, 4 (in) Compression Wrap Compression Stockings Add-Ons Electronic Signature(s) Signed: 10/02/2023 4:41:11 PM By: Yevonne Pax RN Entered By: Betha Loa on 10/02/2023 13:30:38 -------------------------------------------------------------------------------- Wound Assessment Details Patient Name: Date of Service: [facility], [name]. 10/02/2023 3:30 PM Medical Record Number: [id number] Patient Account Number: [id number] Date of Birth/Sex: Treating RN: 20-May-1941 (82 y.o. [name] Primary Care Maren Wiesen: Barbette Reichmann Other Clinician: Betha Loa Referring Adlee Paar: Treating Laurinda Carreno/Extender: Jearld Shines [place], [name] ([id number]) [phone].pdf Page 13 of 19 Weeks in Treatment: 53 Wound Status Wound Number: 15 Primary Pressure Ulcer Etiology: Wound Location: Left, Anterior Lower Leg Wound Open Wounding Event: Pressure Injury Status: Date Acquired: 04/11/2023 Comorbid Cataracts, Lymphedema, Hypertension, Peripheral Arterial Disease, Weeks Of Treatment: 24 History:  Peripheral Venous Disease, Type II Diabetes, Osteoarthritis, Clustered Wound: No Neuropathy Photos Wound Measurements Length: (cm) 8 Width: (cm) 7.3 Depth: (cm) 0.1 Area: (cm) 45.867 Volume: (cm) 4.587 % Reduction in Area: -104.5% % Reduction in Volume: -104.5% Epithelialization: Large (67-100%) Wound Description Classification: Category/Stage IV Exudate Amount: Medium Exudate Type: Serosanguineous Exudate Color: red, brown Foul Odor After Cleansing: No Slough/Fibrino Yes Wound Bed Granulation Amount: None Present (0%) Exposed Structure Necrotic Amount: None Present (0%) Fascia Exposed: No Fat Layer (Subcutaneous Tissue) Exposed: Yes Tendon Exposed: Yes Muscle Exposed: No Joint Exposed: No Bone Exposed: No Treatment Notes Wound #15 (Lower Leg) Wound Laterality: Left, Anterior Cleanser Vashe 5.8 (oz) Discharge Instruction: damp to dry daily Peri-Wound Care Topical Primary Dressing Xeroform 5x9-HBD (in/in) Discharge Instruction: Apply Xeroform 5x9-HBD (in/in) as directed Secondary Dressing ABD Pad 5x9 (in/in) Discharge Instruction: Cover with ABD pad Kerlix 4.5 x 4.1 (in/yd) Discharge Instruction: Apply Kerlix 4.5 x 4.1 (in/yd) as instructed Secured With ACE WRAP - 109M ACE Elastic Bandage With VELCRO Brand Closure, 4 (in) Compression Wrap Compression Stockings Ferger, Jillaine [name] ([id number]) [id number] Page 14 of 19 Add-Ons Electronic Signature(s) Signed: 10/02/2023 4:41:11 PM By: Yevonne Pax RN Entered By: Betha Loa on 10/02/2023 13:31:48 -------------------------------------------------------------------------------- Wound Assessment Details Patient Name: Date of Service: [facility], [name]. 10/02/2023 3:30 PM Medical Record Number: [id number] Patient Account Number: [id number] Date of Birth/Sex: Treating RN: 07/25/1941 (82 y.o. F) Epps,  Carrie Primary Care Livier Hendel: Barbette Reichmann Other Clinician: Betha Loa Referring Tyauna Lacaze: Treating Muhamad Serano/Extender: Jearld Shines Weeks in Treatment: 86 Wound Status Wound Number: 16 Primary Pressure Ulcer Etiology: Wound Location: Left Trochanter Wound Open Wounding Event: Pressure Injury Status: Date Acquired: 05/22/2023 Comorbid Cataracts, Lymphedema, Hypertension, Peripheral Arterial Disease, Weeks Of Treatment: 19 History: Peripheral Venous Disease, Type II Diabetes, Osteoarthritis, Clustered Wound: No Neuropathy Photos Wound Measurements Length: (cm) 1.5 Width: (cm) 0.9 Depth: (cm) 0.7 Area: (cm) 1.06 Volume: (cm) 0.742 % Reduction in Area: 67.4% % Reduction in Volume: -128.3% Undermining: Yes Starting Position (o'clock): 7 Ending Position (o'clock): 10 Maximum Distance: (cm) 1.4 Wound Description Classification: Unstageable/Unclassified Exudate Amount: Medium Exudate Type: Serosanguineous Exudate Color: red, brown Foul Odor After Cleansing: No Slough/Fibrino Yes Wound Bed Necrotic Amount: Large (67-100%) Exposed Structure Necrotic Quality: Eschar, Adherent Slough Fascia Exposed: No Fat Layer (Subcutaneous Tissue) Exposed: Yes Tendon Exposed: No Muscle Exposed: No Joint Exposed: No [name], [name] ([id number]) [id number] Page 15 of 19 Bone Exposed: No Treatment Notes Wound #16 (Trochanter) Wound Laterality: Left Cleanser Vashe 5.8 (oz) Discharge Instruction: Use as directed for cleaning wound Peri-Wound Care Topical Primary Dressing Prisma 4.34 (in) Discharge Instruction: pack into tunnel and wound Secondary Dressing Gauze Discharge Instruction: moisten with Vashe and place over collagen (BORDER) Zetuvit Plus SILICONE BORDER Dressing 5x5 (in/in) Discharge Instruction: Please do not put silicone bordered dressings under wraps. Use non-bordered dressing only. Secured With Compression Wrap Compression Stockings Facilities manager) Signed: 10/02/2023  4:41:11 PM By: Yevonne Pax RN Entered By: Betha Loa on 10/02/2023 13:32:32 -------------------------------------------------------------------------------- Wound Assessment Details Patient Name: Date of Service: [facility], [name]. 10/02/2023 3:30 PM Medical Record Number: [id number] Patient Account Number: [id number] Date of Birth/Sex: Treating RN: [birth year] (82 y.o. [name] Primary Care Alven Alverio: Barbette Reichmann Other Clinician: Betha Loa Referring Justine Cossin: Treating Naydelin Ziegler/Extender: Jearld Shines Weeks in Treatment: 30 Wound Status Wound Number: 18 Primary Pressure Ulcer Etiology: Wound Location: Right Metatarsal head first Wound Open Wounding Event: Pressure Injury Status: Date Acquired: 07/31/2023 Comorbid Cataracts, Lymphedema, Hypertension, Peripheral Arterial Disease, Weeks Of Treatment: 9 History: Peripheral Venous Disease, Type II Diabetes, Osteoarthritis, Clustered Wound: No Neuropathy Photos [name], [name] ([id number]) [id number] Page 16 of 19 Wound Measurements Length: (cm) 1 Width: (cm) 1 Depth: (cm) 0.1 Area: (cm) 0.785 Volume: (cm) 0.079 % Reduction in Area: -5.2% % Reduction in Volume: -5.3% Epithelialization: None Wound Description Classification: Category/Stage II Exudate Amount: Medium Exudate Type: Serosanguineous Exudate Color: red, brown Foul Odor After Cleansing: No Slough/Fibrino Yes Wound Bed Granulation Amount: Medium (34-66%) Exposed Structure Granulation Quality: Red Fascia Exposed: No Necrotic Amount: Medium (34-66%) Fat Layer (Subcutaneous Tissue) Exposed: Yes Necrotic Quality: Eschar, Adherent Slough Tendon Exposed: No Muscle Exposed: No Joint Exposed: No Bone Exposed: No Treatment Notes Wound #18 (Metatarsal head first) Wound Laterality: Right Cleanser Vashe 5.8 (oz) Discharge Instruction: damp to dry daily Peri-Wound Care Topical Primary  Dressing Xeroform 5x9-HBD (in/in) Discharge Instruction: Apply Xeroform 5x9-HBD (in/in) as directed Secondary Dressing ABD Pad 5x9 (in/in) Discharge Instruction: Cover with ABD pad Kerlix 4.5 x 4.1 (in/yd) Discharge Instruction: Apply Kerlix 4.5 x 4.1 (in/yd) as instructed Secured With ACE WRAP - 27M ACE Elastic Bandage With VELCRO Brand Closure, 4 (in) Compression Wrap Compression Stockings Add-Ons Electronic Signature(s) Signed: 10/02/2023 4:41:11 PM By: Yevonne Pax RN Entered By: Betha Loa on 10/02/2023 13:33:02 [name], [name] ([id number]) [id number] Page 17 of 19 -------------------------------------------------------------------------------- Wound Assessment Details Patient Name: Date of Service: [facility] [name], [name] NNE  [name]. 10/02/2023 3:30 PM Medical Record Number: [id number] Patient Account Number: [id number] Date of Birth/Sex: Treating RN: 1941-06-27 (82 y.o. [name] Primary Care Grant Swager: Barbette Reichmann Other Clinician: Betha Loa Referring Steve Gregg: Treating Samiyyah Moffa/Extender: Jearld Shines Weeks in Treatment: 24 Wound Status Wound Number: 19 Primary Venous Leg Ulcer Etiology: Wound Location: Right, Anterior Lower Leg Wound Open Wounding Event: Blister Status: Date Acquired: 09/17/2023 Comorbid Cataracts, Lymphedema, Hypertension, Peripheral Arterial Disease, Weeks Of Treatment: 2 History: Peripheral Venous Disease, Type II Diabetes, Osteoarthritis, Clustered Wound: Yes Neuropathy Photos Wound Measurements Length: (cm) 4.5 Width: (cm) 3.8 Depth: (cm) 0.1 Area: (cm) 13.43 Volume: (cm) 1.343 % Reduction in Area: -90% % Reduction in Volume: -90% Epithelialization: None Wound Description Classification: Full Thickness Without Exposed Support Exudate Amount: Medium Exudate Type: Serosanguineous Exudate Color: red, brown Structures Foul Odor After Cleansing: No Slough/Fibrino Yes Wound  Bed Exposed Structure Fascia Exposed: No Fat Layer (Subcutaneous Tissue) Exposed: Yes Tendon Exposed: No Muscle Exposed: No Joint Exposed: No Bone Exposed: No Electronic Signature(s) Signed: 10/02/2023 4:41:11 PM By: Yevonne Pax RN Entered By: Betha Loa on 10/02/2023 13:36:58 [name], [name] ([id number]) [id number] Page 18 of 19 -------------------------------------------------------------------------------- Wound Assessment Details Patient Name: Date of Service: [facility], [name]. 10/02/2023 3:30 PM Medical Record Number: [id number] Patient Account Number: [id number] Date of Birth/Sex: Treating RN: [birth year] (82 y.o. [name] Primary Care Rejoice Heatwole: Barbette Reichmann Other Clinician: Betha Loa Referring Devonte Migues: Treating Kiela Shisler/Extender: Jearld Shines Weeks in Treatment: 30 Wound Status Wound Number: 6 Primary Pressure Ulcer Etiology: Wound Location: Right Calcaneus Wound Open Wounding Event: Pressure Injury Status: Date Acquired: 07/23/2022 Comorbid Cataracts, Lymphedema, Hypertension, Peripheral Arterial Disease, Weeks Of Treatment: 53 History: Peripheral Venous Disease, Type II Diabetes, Osteoarthritis, Clustered Wound: No Neuropathy Photos Wound Measurements Length: (cm) Width: (cm) Depth: (cm) Area: (cm) Volume: (cm) 2.4 % Reduction in Area: 46% 2.7 % Reduction in Volume: 82% 0.1 Epithelialization: None 5.089 0.509 Wound Description Classification: Category/Stage III Wound Margin: Distinct, outline attached Exudate Amount: Medium Exudate Type: Serosanguineous Exudate Color: red, brown Foul Odor After Cleansing: No Slough/Fibrino Yes Wound Bed Granulation Amount: Medium (34-66%) Exposed Structure Granulation Quality: Red, Pink Fascia Exposed: No Necrotic Amount: Medium (34-66%) Fat Layer (Subcutaneous Tissue) Exposed: Yes Necrotic Quality: Eschar, Adherent Slough Tendon Exposed:  No Muscle Exposed: No Joint Exposed: No Bone Exposed: No Treatment Notes Wound #6 (Calcaneus) Wound Laterality: Right Cleanser Vashe 5.8 (oz) Discharge Instruction: damp to dry daily [name], [name] ([id number]) [id number] Page 19 of 19 Peri-Wound Care Topical Primary Dressing Xeroform 5x9-HBD (in/in) Discharge Instruction: Apply Xeroform 5x9-HBD (in/in) as directed Secondary Dressing ABD Pad 5x9 (in/in) Discharge Instruction: Cover with ABD pad Kerlix 4.5 x 4.1 (in/yd) Discharge Instruction: Apply Kerlix 4.5 x 4.1 (in/yd) as instructed Secured With ACE WRAP - 40M ACE Elastic Bandage With VELCRO Brand Closure, 4 (in) Compression Wrap Compression Stockings Add-Ons Electronic Signature(s) Signed: 10/02/2023 4:41:11 PM By: Yevonne Pax RN Entered By: Betha Loa on 10/02/2023 13:34:09 -------------------------------------------------------------------------------- Vitals Details Patient Name: Date of Service: [facility], [name]. 10/02/2023 3:30 PM Medical Record Number: [id number] Patient Account Number: [id number] Date of Birth/Sex: Treating RN: Jan 25, 1941 (82 y.o. [name] Primary Care Kemya Shed: Barbette Reichmann Other Clinician: Betha Loa Referring Andora Krull: Treating Shaleta Ruacho/Extender: Jearld Shines Weeks in Treatment: 38 Vital Signs Time Taken: 16:05 Temperature (F): 98.3 Weight (lbs): 98 Pulse (bpm): 43 Respiratory Rate (breaths/min): 18 Blood Pressure (mmHg): 121/61 Reference Range: 80 - 120 mg / dl Electronic Signature(s) Unsigned Entered  By: Betha Loa on 10/02/2023 13:08:17 Signature(s): Date(s):

## 2023-10-02 NOTE — Progress Notes (Unsigned)
Cath Change/ Replacement  Patient is present today for a catheter change due to urinary retention.  19 ml of water was removed from the balloon, a 16 FR foley cath was removed without difficulty.  Patient was cleaned and prepped in a sterile fashion with betadine.  A 16  FR foley cath was replaced into the bladder, {dnt complications:[id number]}. Urine return was noted ***ml and urine was *** in color. The balloon was filled with 20ml of sterile water. A night  bag was attached for drainage.  A night bag was also given to the patient and patient was given instruction on how to change from one bag to another. Patient was given proper instruction on catheter care.    Performed by: ***  Follow up: No follow-ups on file.

## 2023-10-03 ENCOUNTER — Ambulatory Visit (INDEPENDENT_AMBULATORY_CARE_PROVIDER_SITE_OTHER): Payer: PPO | Admitting: Urology

## 2023-10-03 DIAGNOSIS — R339 Retention of urine, unspecified: Secondary | ICD-10-CM | POA: Diagnosis not present

## 2023-10-03 DIAGNOSIS — Z466 Encounter for fitting and adjustment of urinary device: Secondary | ICD-10-CM

## 2023-10-08 DIAGNOSIS — L89623 Pressure ulcer of left heel, stage 3: Secondary | ICD-10-CM | POA: Diagnosis not present

## 2023-10-08 DIAGNOSIS — L89890 Pressure ulcer of other site, unstageable: Secondary | ICD-10-CM | POA: Diagnosis not present

## 2023-10-08 DIAGNOSIS — L89894 Pressure ulcer of other site, stage 4: Secondary | ICD-10-CM | POA: Diagnosis not present

## 2023-10-08 DIAGNOSIS — L89613 Pressure ulcer of right heel, stage 3: Secondary | ICD-10-CM | POA: Diagnosis not present

## 2023-10-14 DIAGNOSIS — M48062 Spinal stenosis, lumbar region with neurogenic claudication: Secondary | ICD-10-CM | POA: Diagnosis not present

## 2023-10-14 DIAGNOSIS — M5020 Other cervical disc displacement, unspecified cervical region: Secondary | ICD-10-CM | POA: Diagnosis not present

## 2023-10-14 DIAGNOSIS — M4802 Spinal stenosis, cervical region: Secondary | ICD-10-CM | POA: Diagnosis not present

## 2023-10-14 DIAGNOSIS — Z79899 Other long term (current) drug therapy: Secondary | ICD-10-CM | POA: Diagnosis not present

## 2023-10-14 DIAGNOSIS — M5412 Radiculopathy, cervical region: Secondary | ICD-10-CM | POA: Diagnosis not present

## 2023-10-14 DIAGNOSIS — M5416 Radiculopathy, lumbar region: Secondary | ICD-10-CM | POA: Diagnosis not present

## 2023-10-23 ENCOUNTER — Encounter: Payer: PPO | Attending: Physician Assistant | Admitting: Physician Assistant

## 2023-10-23 ENCOUNTER — Telehealth: Payer: Self-pay

## 2023-10-23 DIAGNOSIS — L97822 Non-pressure chronic ulcer of other part of left lower leg with fat layer exposed: Secondary | ICD-10-CM | POA: Insufficient documentation

## 2023-10-23 DIAGNOSIS — I890 Lymphedema, not elsewhere classified: Secondary | ICD-10-CM | POA: Diagnosis not present

## 2023-10-23 DIAGNOSIS — L97812 Non-pressure chronic ulcer of other part of right lower leg with fat layer exposed: Secondary | ICD-10-CM | POA: Diagnosis not present

## 2023-10-23 DIAGNOSIS — I110 Hypertensive heart disease with heart failure: Secondary | ICD-10-CM | POA: Insufficient documentation

## 2023-10-23 DIAGNOSIS — I5032 Chronic diastolic (congestive) heart failure: Secondary | ICD-10-CM | POA: Insufficient documentation

## 2023-10-23 DIAGNOSIS — E1140 Type 2 diabetes mellitus with diabetic neuropathy, unspecified: Secondary | ICD-10-CM | POA: Insufficient documentation

## 2023-10-23 DIAGNOSIS — E11622 Type 2 diabetes mellitus with other skin ulcer: Secondary | ICD-10-CM | POA: Diagnosis not present

## 2023-10-23 DIAGNOSIS — I87312 Chronic venous hypertension (idiopathic) with ulcer of left lower extremity: Secondary | ICD-10-CM | POA: Diagnosis not present

## 2023-10-23 DIAGNOSIS — L89623 Pressure ulcer of left heel, stage 3: Secondary | ICD-10-CM | POA: Diagnosis not present

## 2023-10-23 DIAGNOSIS — L89613 Pressure ulcer of right heel, stage 3: Secondary | ICD-10-CM | POA: Diagnosis not present

## 2023-10-23 DIAGNOSIS — E1151 Type 2 diabetes mellitus with diabetic peripheral angiopathy without gangrene: Secondary | ICD-10-CM | POA: Diagnosis not present

## 2023-10-23 NOTE — Telephone Encounter (Signed)
Patient's son called to schedule appt with Dr. Rivka Safer. States that at wound care appointment today nurse had concern regarding patient's heel. States that there is an odor on right heel. Noticed it yesterday during dressing change, but feels like its worse today.  Call transferred to scheduling for appointment. Juanita Laster, RMA

## 2023-10-24 NOTE — Progress Notes (Signed)
[name], [name] ([id number]) [id number] Page 1 of 13 Visit Report for 10/23/2023 Chief Complaint Document Details Patient Name: Date of Service: [name], [place] [name]. 10/23/2023 3:30 PM Medical Record Number: [id number] Patient Account Number: [id number] Date of Birth/Sex: Treating RN: Jul 22, 1941 (82 y.o. [name] Primary Care Provider: Barbette [name] Other Clinician: Betha [name] Referring Provider: Treating Provider/Extender: [name]: 34 Information Obtained from: Patient Chief Complaint Right heel wound, right hip wound, right [name] wound, left [name] wound, right lower extremity wound, left anterior lower extremity wound, left hip wound Electronic Signature(s) Signed: 10/23/2023 5:49:28 PM By: [name] Derry PA-C Entered By: [name] on 10/23/2023 17:49:28 -------------------------------------------------------------------------------- HPI Details Patient Name: Date of Service: [facility], [name] [name]. 10/23/2023 3:30 PM Medical Record Number: [id number] Patient Account Number: [id number] Date of Birth/Sex: Treating RN: [birth year] (82 y.o. [name] Primary Care Provider: Barbette [name] Other Clinician: Betha [name] Referring Provider: Treating Provider/Extender: [name]: 20 History of Present Illness HPI Description: 82 year old patient who comes with a referral for bilateral lower extremity edema and a lower extremity ulceration and has been sent by her PCP [name]. I understand the patient was recently put on amoxicillin and doxycycline but could not tolerate the amoxicillin. doxycycline course was completed. a BNP and EKG was supposed to be normal and the patient did not have any dyspnea. the patient has been on a diuretic. The patient was also prescribed a pair of elastic compression stockings of the 20-30 mmHg pressure variety. x-ray  of the right [name] was done on 09/20/2015 and showed posttraumatic and postsurgical changes of the right [name] with secondary degenerative changes of the tibiotalar joint and to a lesser degree the subtalar joint. No definite acute bony abnormalities are noted. Past medical history significant for diabetes mellitus, hypertension, hyperlipidemia, right breast cancer treated with a mastectomy in 2014. She has never smoked. 10/24/2015 -- she had delayed her vascular test because of her husband surgery but she is now ready to get him taken care of. He is also unable to use compression stockings and hence we will need to order her [name]. 10/31/2015-- was seen by [name] on 10/28/2015. She had a left lower extremity arterial duplex done at his office a couple of years ago and that was essentially normal. Today they performed a venous duplex which revealed no evidence of deep vein thrombosis, superficial thrombophlebitis, no venous reflex was seen on the right and a minimal amount of reflux was seen on the left great saphenous vein but no significant reflux was seen. Impression was that there was a component of lymphedema present from a previous surgery and he would recommend compression stockings and leg elevation. [name], [name] ([id number]) [id number] Page 2 of 13 10/2; is a patient we are following every 2 weeks. Apparently seen by [name] since she was last here no a day additional antibiotics were added. There was some suggestion about US obtaining cultures and we could bring them to her attention. The patient has stage IV wound over the right greater trochanter and unstageable area on the left but it is probably stage IV as well. She has a small area on the tip of her left heel larger wounds on the left [name], left medial [name] Readmission: 07-24-2022 upon evaluation today patient presents for initial inspection here in our clinic concerning  issues that she has been having with her legs this is  actually been going on for several years according to what her family member with her today tells me as well as what the patient reiterates as well. She is currently most recently been seeing [name] and subsequently he had her in [name]. However 2 weeks ago he referred her to [place] and then subsequently took her out of the Unna boot [name] at that point. At this time the left leg looks to be worse in the right leg currently. She is on Lasix and lisinopril with hydrochlorothiazide she has high blood pressure she also has issues currently with lower extremity swelling and edema which has been an ongoing issue for her as well. Patient does have a history of chronic venous hypertension, lymphedema, diabetes mellitus type 2, hypertension, peripheral vascular disease, and neuropathy. Currently she is on Lasix as well as lisinopril with hydrochlorothiazide. 08-02-2022 upon evaluation today patient presents for reevaluation the good news is she is actually doing somewhat better in regard to the wound and the overall appearance and sinuses. The unfortunate thing is her infection really is not significantly improved we did have to switch out her antibiotic once we got that final result back and I switched her to Levaquin and away from the doxycycline. Unfortunately the doxycycline had been doing poorly for her. In fact she had had diarrhea from the time she started taking it on Friday and she is still having it when she shows up today for evaluation. Again I was not aware of this and obviously she does appear to be somewhat dehydrated as well based on what I see. My concern which I discussed with the patient today is the possibility of a C. difficile infection. With that being said fact this started immediately upon taking the doxycycline makes me think that it was just the medicine and is not completely out of her system yet despite having taken the last  dose Tuesday morning. Nonetheless with what we are seeing currently I want to be sure that reason I Minna contact her primary care provider and see if a would be willing to see her and test for C. difficile infection. 08-07-2022 upon evaluation today patient appears to be doing well currently in regard to her wounds which are actually measuring much better this is great news. Fortunately I do not see any signs of active infection locally or systemically at this time which is great as well. The good news is she was tested for a C. difficile infection and it was negative. I am very pleased and thankful for primary care provider for doing that so this means that she was just having a severe reaction to the doxycycline we have added that to her allergy list at this point. 08-14-2022 upon evaluation today patient appears to be doing well currently in regard to her dehydration she is actually significantly improved compared to last time I saw her last week. With that being said I do not see any evidence of active infection systemically at this time which is great news. However locally she still does appear to have cellulitis in left lower extremity. I discussed with her today that I do believe she would benefit from going ahead and starting the Levaquin. Again we will concerned about the C. difficile infection of the diarrhea but that this turned out to be just an issue with a reaction to the doxycycline. My hope is that the Levaquin will not cause her any complication and will be able to treat the infection. I did  review her arterial study as well which was dated on 07-31-2022. It showed that she had a ABI on the right of 1.30 and on the left of 1.27 with a TBI on the right of 1.12 and the left 1.21 this is a normal arterial study. Again on the patient's wound culture she actually did show evidence of Proteus, Morganella, and MRSA. Levaquin is a good option here across the board. 09/26/2022 Ms. Diane  Ridder is a 82 year old female with a past medical history of type 2 diabetes currently controlled on oral agents, venous insufficiency/lymphedema, right breast cancer and chronic diastolic heart failure that presents to the clinic for a 1 month history of nonhealing wounds to the left lower extremity, right heel and left buttocks. She states that the buttocks wound and the right heel wound developed while she was in the hospital. She was admitted on 08/22/2022 for severe sepsis secondary to acute sigmoid and distal colonic diverticulitis. At that time it was noted she had a stage I decubitus ulcer to her bilateral buttocks. A wound to the heel was not mentioned. She currently denies systemic signs of infection. She came into clinic in a blue gown with no undergarments. She has not been dressing the wounds. She states she just recently obtained home health and they are coming out for the first time this week. She is seen in our clinic often for lower extremity wounds secondary to venous insufficiency. 11/22; patient presents for follow-up. Son is present during the encounter. Per son it sounds like they are not doing any dressing changes. She states she has home health but they have not come out. She is going to let us know which home health agency she has been approved for so we can send orders. Currently she denies signs of infection. 12/13; patient presents for follow-up. Patient has home health and they are coming out once a week. It appears that there has not been any dressing changes except for with home health. Patient has a newly discovered wound to the left [name]. There is exposed bone. There is slight erythema and increased warmth to the surrounding tissue. Patient is completely unaware of this wound. 12/20; patient presents for follow-up. She has been taking her oral antibiotics. She now has an eschar and wound to the right hip. She has been using Dakin's wet-to-dry dressings to the left  [name] wound and buttocks wound. She has been using Medihoney and silver alginate to the right heel wound. She currently denies systemic signs of infection. She is mainly bedbound or in a wheelchair. 1/10; patient presents for follow-up. Patient had her left second toenail removed by [name], podiatry on 12/21. Unfortunately she did not feel well and she was admitted to the hospital for sepsis. Her right heel wound was thought to be infected and this was debrided in the OR by [name]. Culture and bone biopsy had no growth. She has been using Medihoney to all the wound beds except for the lefty buttocks wound for which she uses Dakin's wet-to-dry dressings. She has developed a pressure injury to the left heel now. This is despite having Prevalon [name]. Although she is not wearing them today. She has completed 4 weeks of Augmentin and also a week of doxycycline for osteomyelitis of the left [name]. Her left [name] wound no longer probes to bone. Currently she denies signs of infection. 1/24; patient presents for follow-up. She has been using Medihoney and Hydrofera Blue to the wound beds except for this buttocks  wound she has been using Dakin's wet-to-dry dressings. She has developed 2 new wounds 1 to the left heel and the other to the right medial [name]. She claims she is wearing her Prevalon [name] all the time although she does not have them on today. She currently denies signs of infection. 2/21; patient presents for follow-up. She was recently hospitalized on 12/19/2022 for sepsis secondary to Enterococcus bacillus bacteremia. She was treated with IV antibiotics and has completed her discharge oral antibiotics. She had a CT scanning of the abdomen/pelvis without acute abnormalities. Husband is present with patient today. They have been using Dakin's wet-to-dry dressings to the sacral wound and hip wound and Hydrofera Blue and Medihoney to the feet wounds. They are not changing the dressings daily. It  is unclear how often they actually change the dressings. She has been wearing her Prevalon [name] at night but not during the day. She currently denies systemic signs of infection. 3/20; patient presents for follow-up. She again was in the hospital for severe sepsis on 2/24; MRIs at that time showed findings suspicious for osteomyelitis to the right hip. She is currently on Augmentin to complete 4 weeks of this. She is following with ID. She has been using Dakin's wet-to-dry dressings to the sacrum and right hip however the solution is starting to cause discomfort. T the rest of the wound she has been using Hydrofera Blue and Medihoney. Patient o and son are not interested in doing palliative care or hospice. They state that they had discussions while in the hospital. 4/17; patient presents for follow-up. She has been using Vashe wet-to-dry dressings to the sacrum and right trochanter wound. She has been using Medihoney and Hydrofera Blue to the bilateral feet wounds. She has no issues or complaints today. She denies signs of infection. 5/1; patient developed a new wound to the right leg. Son was present and states that there was a scab and he removed it creating a wound. He has been keeping the area covered. T the sacral and right trochanter wound they have been using Vashe wet-to-dry dressings and Medihoney and Hydrofera Blue to the o bilateral feet wounds. She denies signs of infection. 6/1; 1 month follow-up. She has a new area on the left anterior lower leg. In the meantime the sacral ulcer is closed. She still has an extensive wound on the [facility] [name] ([id number]) [id number] Page 3 of 13 right hip with exposed tendon over the greater trochanter. On the lower extremities the left anterior lower leg wound is new right [name] left heel and right heel all look reasonably stable. We are using Vashe wet-to-dry on the hip Medihoney and Hydrofera Blue on the lower  extremities. She is accompanied by her son I think who is doing the dressings. They do not have a pressure-relief surface at home but they do have a hospital bed 7/10; I have not seen this patient since 03/13/2023. She was scheduled to be seen on 5/29 however missed her appointment due to illness. She was seen on 6/5 by Dr. Leanord Hawking and she presents today for her follow-up. Unfortunately her wounds have declined since I last saw her. She has several new wounds including one to the left hip wound and one left anterior lower leg. The right hip wound has declined and has maggots with bone exposed. She has been using Vashe wet- to-dry dressings to Right hip wound and Medihoney and Hydrofera Blue to the The remaining wounds. Sacral wound remains closed. 7/24; patient presents for  follow-up. At last clinic visit I recommended she go to the ED for potential admission, IV antibiotics and further imaging. She had MRI of the pelvis and left [name] that showed osteomyelitis. Podiatry advised conservative management. She was also seen by infectious disease who discharged patient on 2 weeks of linezolid and 2 weeks of ciprofloxacin. Patient is at home and has home health. Palliative care was consulted during the stay and she is full code and has declined further outpatient palliative care and hospice services.. She currently denies systemic signs of infection. Overall there is improvement in the appearance of the wounds. There is healthier granulation tissue present. However this is overall poor prognosis case. She has Prevalon [name] but it is unclear if she is using these. She does not have them on today. She follows up with infectious disease next week. 8/7; Patient's been using Vashe wet-to-dry dressing to all the wound beds except for the left hip she has been using Santyl. She followed up with infectious disease on 8/1. Currently she is on linezolid and ciprofloxacin. They recommended lab work to see if she needs  extension of her oral antibiotics. She currently denies systemic signs of infection. 8/21; patient presents for follow-up. She has been using Vashe wet-to-dry dressings to all wounds except the left hip she has been using Santyl. All wounds have improved in size and appearance. She has healthy granulation tissue present to all wounds except the left hip still has nonviable tissue throughout. She states she has completed her oral antibiotics per ID. She currently denies systemic signs of infection. 9/4; patient presents for follow-up. She has been using Vashe wet-to-dry dressings to all wounds except the left hip. T the left hip she has been using Santyl. o All wounds appear well-healing. She currently denies systemic signs of infection. 9/18; 2-week follow-up. This is a very disabled woman who is nonambulatory. 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 She has significant bilateral lower extremity pressure ulcers bilaterally. 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 She is not ambulatory. She has extensive wounds on both trochanters. The right has exposed bone in the left nonviable tissue. I aggressively debrided this the last time she was here. We have been using Santyl on the left hip and Vashe wet-to-dry on the right hip. She has wounds on her bilateral lower legs extensively on the right medial [name] first met head [name] and lower leg area on the left lower leg and left heel. All of these seem somewhat improved again using Vashe wet-to-dry. We have been attempting to get a level 3 surface or some form of offloading surface for the patient although having trouble with their insurance which apparently is [facility] 10/16; patient is doing very well. The wounds on the lower extremity even the extensive areas seem to be a lot better with  healthy granulation. We have been using the [facility] equivalent of Vashe wet-to-dry. There is areas over the lateral part of the greater trochanters. The area on the left looks a lot better we have been using Santyl with backing wet to dry gauze. We have been using Vashe wet-to-dry in the right greater trochanter. I think we are in line with changing the dressing next week to both hip areas We are apparently finally making some inroads in getting her a level 3 surface for  her bed 10/23; patient continues to make nice improvement in the bilateral lower leg wounds using the equivalent of Vashe wet-to-dry. I did plan to change this dressing but the wounds are making such a nice improvement I continued it. T oday I have changed the greater trochanter stage IV pressure ulcers to a Prisma and then hydrogel wet-to-dry packing with border foam change every 2 D. Really remarkable improvement credit to the family this looking after her 09-18-2023 upon evaluation today patient appears to be doing more poorly especially in regard to her heels and lower extremities where she has deep tissue injuries on both heels and she has new blisters that have opened up on her legs. This is due to the fact that she is not elevating her legs she is staying in her wheelchair sitting not moving around throughout the day and this is not the best thing for her to be honest. Fortunately I do not think she is infected right now but at the same time this is still concerning to me. 10-02-2023 upon evaluation today patient appears to be doing really about the same in regard to her wounds. There is not much improvement here unfortunately. I do not see any signs of active infection at this time which is good news. With that being said she still has significant wounds on both hips the left is better than the right she also has many wounds scattered over her legs as well as both heels. 10-23-2023 patient continues to have significant issues with  her wounds in general. With that being said fortunately I do not see any signs of active infection at this time which is great news and in general I believe that we are making decent progress all things considered at this point. The heels are 1 exception she continues to have too much pressure to the heels unfortunately. With all that being said the patient is unfortunately still not using the offloading [name] as much as she should be. I explained to her that this is going be of utmost importance that she is can to see this heal and get better. She voiced understanding and her son states they will begin using them on a regular basis. Electronic Signature(s) Signed: 10/23/2023 5:49:38 PM By: [name] Derry PA-C Entered By: [name] on 10/23/2023 17:49:38 Physical Exam Details -------------------------------------------------------------------------------- [name] ([id number]) [id number] Page 4 of 13 Patient Name: Date of Service: [facility], [name] [name]. 10/23/2023 3:30 PM Medical Record Number: [id number] Patient Account Number: [id number] Date of Birth/Sex: Treating RN: July 25, 1941 (82 y.o. [name] Primary Care Provider: Barbette [name] Other Clinician: Betha [name] Referring Provider: Treating Provider/Extender: [name]: 52 Constitutional Well-nourished and well-hydrated in no acute distress. Respiratory normal breathing without difficulty. Psychiatric this patient is able to make decisions and demonstrates good insight into disease process. Alert and Oriented x 3. pleasant and cooperative. Notes Patient's wounds all show signs of varying degrees of improvement. Based on what I am seeing I do believe that we are doing well with the current wound care measures and my suggestion is gena be as such that we continue with the current plans. She is in agreement with that plan. Electronic  Signature(s) Signed: 10/23/2023 5:50:55 PM By: [name] Derry PA-C Entered By: [name] on 10/23/2023 17:50:55 -------------------------------------------------------------------------------- Physician Orders Details Patient Name: Date of Service: [facility], [name] [name]. 10/23/2023 3:30 PM Medical Record Number: [id number] Patient Account Number: [id number] Date of Birth/Sex: Treating RN: Jul 25, 1941 (  82 y.o. [name] Primary Care Provider: Barbette [name] Other Clinician: Betha [name] Referring Provider: Treating Provider/Extender: [name]: 20 The following information was scribed by: [name] The information was scribed for: [name] Verbal / Phone Orders: No Diagnosis Coding Follow-up Appointments Return Appointment in 2 weeks. Home Health Home Health Company: - Harlin Rain, RN [phone] [facility] for wound care. May utilize formulary equivalent dressing for wound [name] orders unless otherwise specified. Home Health Nurse may visit PRN to address patients wound care needs. Off-Loading Low air-loss mattress (Group 2) Wound [name] Wound #11 - Trochanter Wound Laterality: Right Cleanser: Vashe 5.8 (oz) 1 x Per Day/30 Days Discharge Instructions: Use as directed for cleaning wound Prim Dressing: Prisma 4.34 (in) 1 x Per Day/30 Days ary Discharge Instructions: pack into tunnel and wound Secondary Dressing: Gauze 1 x Per Day/30 Days Discharge Instructions: moisten with Vashe and place over collagen [name], [name] ([id number]) [id number] Page 5 of 13 Secondary Dressing: (BORDER) Zetuvit Plus SILICONE BORDER Dressing 5x5 (in/in) 1 x Per Day/30 Days Discharge Instructions: Please do not put silicone bordered dressings under [name]. Use non-bordered dressing only. Wound #13 - Calcaneus Wound Laterality: Left Cleanser: Vashe 5.8 (oz) (Home Health) 1 x Per Day/30  Days Discharge Instructions: damp to dry daily Topical: Betadine 1 x Per Day/30 Days Discharge Instructions: Apply betadine to heel Secondary Dressing: ABD Pad 5x9 (in/in) 1 x Per Day/30 Days Discharge Instructions: Cover with ABD pad Secondary Dressing: Kerlix 4.5 x 4.1 (in/yd) 1 x Per Day/30 Days Discharge Instructions: Apply Kerlix 4.5 x 4.1 (in/yd) as instructed Secured With: ACE WRAP - 18M ACE Elastic Bandage With VELCRO Brand Closure, 4 (in) 1 x Per Day/30 Days Wound #14 - Lower Leg Wound Laterality: Right, Medial, Distal Cleanser: Vashe 5.8 (oz) (Home Health) 1 x Per Day/30 Days Discharge Instructions: damp to dry daily Prim Dressing: Xeroform 5x9-HBD (in/in) 1 x Per Day/30 Days ary Discharge Instructions: Apply Xeroform 5x9-HBD (in/in) as directed Secondary Dressing: ABD Pad 5x9 (in/in) 1 x Per Day/30 Days Discharge Instructions: Cover with ABD pad Secondary Dressing: Kerlix 4.5 x 4.1 (in/yd) 1 x Per Day/30 Days Discharge Instructions: Apply Kerlix 4.5 x 4.1 (in/yd) as instructed Secured With: ACE WRAP - 18M ACE Elastic Bandage With VELCRO Brand Closure, 4 (in) 1 x Per Day/30 Days Wound #15 - Lower Leg Wound Laterality: Left, Anterior Cleanser: Vashe 5.8 (oz) (Home Health) 1 x Per Day/30 Days Discharge Instructions: damp to dry daily Prim Dressing: Xeroform 5x9-HBD (in/in) 1 x Per Day/30 Days ary Discharge Instructions: Apply Xeroform 5x9-HBD (in/in) as directed Secondary Dressing: ABD Pad 5x9 (in/in) 1 x Per Day/30 Days Discharge Instructions: Cover with ABD pad Secondary Dressing: Kerlix 4.5 x 4.1 (in/yd) 1 x Per Day/30 Days Discharge Instructions: Apply Kerlix 4.5 x 4.1 (in/yd) as instructed Secured With: ACE WRAP - 18M ACE Elastic Bandage With VELCRO Brand Closure, 4 (in) 1 x Per Day/30 Days Wound #16 - Trochanter Wound Laterality: Left Cleanser: Vashe 5.8 (oz) 1 x Per Day/30 Days Discharge Instructions: Use as directed for cleaning wound Prim Dressing: Prisma 4.34 (in) 1 x  Per Day/30 Days ary Discharge Instructions: pack into tunnel and wound Secondary Dressing: Gauze 1 x Per Day/30 Days Discharge Instructions: moisten with Vashe and place over collagen Secondary Dressing: (BORDER) Zetuvit Plus SILICONE BORDER Dressing 5x5 (in/in) 1 x Per Day/30 Days Discharge Instructions: Please do not put silicone bordered dressings under [name]. Use non-bordered dressing only. Wound #18 -  Metatarsal head first Wound Laterality: Right Cleanser: Vashe 5.8 (oz) (Home Health) 1 x Per Day/30 Days Discharge Instructions: damp to dry daily Prim Dressing: Xeroform 5x9-HBD (in/in) 1 x Per Day/30 Days ary Discharge Instructions: Apply Xeroform 5x9-HBD (in/in) as directed Secondary Dressing: ABD Pad 5x9 (in/in) 1 x Per Day/30 Days Discharge Instructions: Cover with ABD pad Secondary Dressing: Kerlix 4.5 x 4.1 (in/yd) 1 x Per Day/30 Days Discharge Instructions: Apply Kerlix 4.5 x 4.1 (in/yd) as instructed Secured With: ACE WRAP - 40M ACE Elastic Bandage With VELCRO Brand Closure, 4 (in) 1 x Per Day/30 Days [name], [name] ([id number]) [id number] Page 6 of 13 Wound #19 - Lower Leg Wound Laterality: Right, Anterior Cleanser: Vashe 5.8 (oz) (Home Health) 1 x Per Day/30 Days Discharge Instructions: damp to dry daily Prim Dressing: Xeroform 5x9-HBD (in/in) 1 x Per Day/30 Days ary Discharge Instructions: Apply Xeroform 5x9-HBD (in/in) as directed Secondary Dressing: ABD Pad 5x9 (in/in) 1 x Per Day/30 Days Discharge Instructions: Cover with ABD pad Secondary Dressing: Kerlix 4.5 x 4.1 (in/yd) 1 x Per Day/30 Days Discharge Instructions: Apply Kerlix 4.5 x 4.1 (in/yd) as instructed Secured With: ACE WRAP - 40M ACE Elastic Bandage With VELCRO Brand Closure, 4 (in) 1 x Per Day/30 Days Wound #6 - Calcaneus Wound Laterality: Right Cleanser: Vashe 5.8 (oz) (Home Health) 1 x Per Day/30 Days Discharge Instructions: damp to dry daily Prim Dressing: Xeroform 5x9-HBD  (in/in) 1 x Per Day/30 Days ary Discharge Instructions: Apply Xeroform 5x9-HBD (in/in) as directed Secondary Dressing: ABD Pad 5x9 (in/in) 1 x Per Day/30 Days Discharge Instructions: Cover with ABD pad Secondary Dressing: Kerlix 4.5 x 4.1 (in/yd) 1 x Per Day/30 Days Discharge Instructions: Apply Kerlix 4.5 x 4.1 (in/yd) as instructed Secured With: ACE WRAP - 40M ACE Elastic Bandage With VELCRO Brand Closure, 4 (in) 1 x Per Day/30 Days Electronic Signature(s) Signed: 10/23/2023 5:40:51 PM By: [name] Signed: 10/23/2023 7:54:12 PM By: [name] Derry PA-C Entered By: [name] on 10/23/2023 17:30:20 -------------------------------------------------------------------------------- Problem List Details Patient Name: Date of Service: [facility], [name] [name]. 10/23/2023 3:30 PM Medical Record Number: [id number] Patient Account Number: [id number] Date of Birth/Sex: Treating RN: 1941/07/14 (82 y.o. [name] Primary Care Provider: Barbette [name] Other Clinician: Betha [name] Referring Provider: Treating Provider/Extender: [name]: 86 Active Problems ICD-10 Encounter Code Description Active Date MDM Diagnosis L97.822 Non-pressure chronic ulcer of other part of left lower leg with fat layer 09/26/2022 No Yes exposed L97.812 Non-pressure chronic ulcer of other part of right lower leg with fat layer 03/13/2023 No Yes exposed Dolinski, [name] ([id number]) [id number] Page 7 of 13 I87.312 Chronic venous hypertension (idiopathic) with ulcer of left lower extremity 09/26/2022 No Yes I89.0 Lymphedema, not elsewhere classified 09/26/2022 No Yes E11.622 Type 2 diabetes mellitus with other skin ulcer 09/26/2022 No Yes L89.610 Pressure ulcer of right heel, unstageable 09/26/2022 No Yes L89.513 Pressure ulcer of right [name], stage 3 12/05/2022 No Yes L89.620 Pressure ulcer of left heel, unstageable 12/05/2022 No  Yes M86.172 Other acute osteomyelitis, left [name] and [name] 10/24/2022 No Yes L89.214 Pressure ulcer of right hip, stage 4 11/21/2022 No Yes L89.220 Pressure ulcer of left hip, unstageable 05/22/2023 No Yes I50.32 Chronic diastolic (congestive) heart failure 09/26/2022 No Yes Z79.84 Long term (current) use of oral hypoglycemic drugs 03/13/2023 No Yes Inactive Problems ICD-10 Code Description Active Date Inactive Date S81.802A Unspecified open wound, left lower leg, initial encounter 05/22/2023 05/22/2023 Resolved Problems ICD-10 Code Description Active Date Resolved  Date L89.320 Pressure ulcer of left buttock, unstageable 09/26/2022 09/26/2022 S91.302A Unspecified open wound, left [name], initial encounter 10/24/2022 10/24/2022 Electronic Signature(s) Signed: 10/23/2023 5:49:21 PM By: [name] Derry PA-C Entered By: [name] on 10/23/2023 17:49:21 [name], [name] ([id number]) [id number] Page 8 of 13 -------------------------------------------------------------------------------- Progress Note Details Patient Name: Date of Service: [facility] [name], [name] [name]. 10/23/2023 3:30 PM Medical Record Number: [id number] Patient Account Number: [id number] Date of Birth/Sex: Treating RN: 25-Jul-1941 (82 y.o. [name] Primary Care Provider: Barbette [name] Other Clinician: Betha [name] Referring Provider: Treating Provider/Extender: [name]: 86 Subjective Chief Complaint Information obtained from Patient Right heel wound, right hip wound, right [name] wound, left [name] wound, right lower extremity wound, left anterior lower extremity wound, left hip wound History of Present Illness (HPI) 82 year old patient who comes with a referral for bilateral lower extremity edema and a lower extremity ulceration and has been sent by her PCP [name]. I understand the patient was recently put on amoxicillin and doxycycline but could  not tolerate the amoxicillin. doxycycline course was completed. a BNP and EKG was supposed to be normal and the patient did not have any dyspnea. the patient has been on a diuretic. The patient was also prescribed a pair of elastic compression stockings of the 20-30 mmHg pressure variety. x-ray of the right [name] was done on 09/20/2015 and showed posttraumatic and postsurgical changes of the right [name] with secondary degenerative changes of the tibiotalar joint and to a lesser degree the subtalar joint. No definite acute bony abnormalities are noted. Past medical history significant for diabetes mellitus, hypertension, hyperlipidemia, right breast cancer treated with a mastectomy in 2014. She has never smoked. 10/24/2015 -- she had delayed her vascular test because of her husband surgery but she is now ready to get him taken care of. He is also unable to use compression stockings and hence we will need to order her [name]. 10/31/2015-- was seen by [name] on 10/28/2015. She had a left lower extremity arterial duplex done at his office a couple of years ago and that was essentially normal. Today they performed a venous duplex which revealed no evidence of deep vein thrombosis, superficial thrombophlebitis, no venous reflex was seen on the right and a minimal amount of reflux was seen on the left great saphenous vein but no significant reflux was seen. Impression was that there was a component of lymphedema present from a previous surgery and he would recommend compression stockings and leg elevation. 10/2; is a patient we are following every 2 weeks. Apparently seen by [name] since she was last here no a day additional antibiotics were added. There was some suggestion about US obtaining cultures and we could bring them to her attention. The patient has stage IV wound over the right greater trochanter and unstageable area on the left but it is probably stage IV as well. She has a small area  on the tip of her left heel larger wounds on the left [name], left medial [name] Readmission: 07-24-2022 upon evaluation today patient presents for initial inspection here in our clinic concerning issues that she has been having with her legs this is actually been going on for several years according to what her family member with her today tells me as well as what the patient reiterates as well. She is currently most recently been seeing [name] and subsequently he had her in [place] [name]. However 2 weeks ago he  referred her to [place] and then subsequently took her out of the Unna boot [name] at that point. At this time the left leg looks to be worse in the right leg currently. She is on Lasix and lisinopril with hydrochlorothiazide she has high blood pressure she also has issues currently with lower extremity swelling and edema which has been an ongoing issue for her as well. Patient does have a history of chronic venous hypertension, lymphedema, diabetes mellitus type 2, hypertension, peripheral vascular disease, and neuropathy. Currently she is on Lasix as well as lisinopril with hydrochlorothiazide. 08-02-2022 upon evaluation today patient presents for reevaluation the good news is she is actually doing somewhat better in regard to the wound and the overall appearance and sinuses. The unfortunate thing is her infection really is not significantly improved we did have to switch out her antibiotic once we got that final result back and I switched her to Levaquin and away from the doxycycline. Unfortunately the doxycycline had been doing poorly for her. In fact she had had diarrhea from the time she started taking it on Friday and she is still having it when she shows up today for evaluation. Again I was not aware of this and obviously she does appear to be somewhat dehydrated as well based on what I see. My concern which I discussed with the patient today is the possibility of a C. difficile  infection. With that being said fact this started immediately upon taking the doxycycline makes me think that it was just the medicine and is not completely out of her system yet despite having taken the last dose Tuesday morning. Nonetheless with what we are seeing currently I want to be sure that reason I Minna contact her primary care provider and see if a would be willing to see her and test for C. difficile infection. 08-07-2022 upon evaluation today patient appears to be doing well currently in regard to her wounds which are actually measuring much better this is great news. Fortunately I do not see any signs of active infection locally or systemically at this time which is great as well. The good news is she was tested for a C. difficile infection and it was negative. I am very pleased and thankful for primary care provider for doing that so this means that she was just having a severe reaction to the doxycycline we have added that to her allergy list at this point. 08-14-2022 upon evaluation today patient appears to be doing well currently in regard to her dehydration she is actually significantly improved compared to last time I saw her last week. With that being said I do not see any evidence of active infection systemically at this time which is great news. However locally she still does appear to have cellulitis in left lower extremity. I discussed with her today that I do believe she would benefit from going ahead and starting the Levaquin. Again we will concerned about the C. difficile infection of the diarrhea but that this turned out to be just an issue with a reaction to the doxycycline. My hope is that the Levaquin will not cause her any complication and will be able to treat the infection. I did review her arterial study as well which was dated on 07-31-2022. It showed that she had a ABI on the right of 1.30 and on the left of 1.27 with a TBI on the right of 1.12 and the left 1.21 this is  a normal arterial study. Again  on the patient's wound culture she actually did show evidence of Proteus, Morganella, and MRSA. Levaquin is a good option here across the board. [name], [name] ([id number]) [id number] Page 9 of 13 09/26/2022 Ms. Diane Ovalle is a 82 year old female with a past medical history of type 2 diabetes currently controlled on oral agents, venous insufficiency/lymphedema, right breast cancer and chronic diastolic heart failure that presents to the clinic for a 1 month history of nonhealing wounds to the left lower extremity, right heel and left buttocks. She states that the buttocks wound and the right heel wound developed while she was in the hospital. She was admitted on 08/22/2022 for severe sepsis secondary to acute sigmoid and distal colonic diverticulitis. At that time it was noted she had a stage I decubitus ulcer to her bilateral buttocks. A wound to the heel was not mentioned. She currently denies systemic signs of infection. She came into clinic in a blue gown with no undergarments. She has not been dressing the wounds. She states she just recently obtained home health and they are coming out for the first time this week. She is seen in our clinic often for lower extremity wounds secondary to venous insufficiency. 11/22; patient presents for follow-up. Son is present during the encounter. Per son it sounds like they are not doing any dressing changes. She states she has home health but they have not come out. She is going to let us know which home health agency she has been approved for so we can send orders. Currently she denies signs of infection. 12/13; patient presents for follow-up. Patient has home health and they are coming out once a week. It appears that there has not been any dressing changes except for with home health. Patient has a newly discovered wound to the left [name]. There is exposed bone. There is slight erythema  and increased warmth to the surrounding tissue. Patient is completely unaware of this wound. 12/20; patient presents for follow-up. She has been taking her oral antibiotics. She now has an eschar and wound to the right hip. She has been using Dakin's wet-to-dry dressings to the left [name] wound and buttocks wound. She has been using Medihoney and silver alginate to the right heel wound. She currently denies systemic signs of infection. She is mainly bedbound or in a wheelchair. 1/10; patient presents for follow-up. Patient had her left second toenail removed by [name], podiatry on 12/21. Unfortunately she did not feel well and she was admitted to the hospital for sepsis. Her right heel wound was thought to be infected and this was debrided in the OR by [name]. Culture and bone biopsy had no growth. She has been using Medihoney to all the wound beds except for the lefty buttocks wound for which she uses Dakin's wet-to-dry dressings. She has developed a pressure injury to the left heel now. This is despite having Prevalon [name]. Although she is not wearing them today. She has completed 4 weeks of Augmentin and also a week of doxycycline for osteomyelitis of the left [name]. Her left [name] wound no longer probes to bone. Currently she denies signs of infection. 1/24; patient presents for follow-up. She has been using Medihoney and Hydrofera Blue to the wound beds except for this buttocks wound she has been using Dakin's wet-to-dry dressings. She has developed 2 new wounds 1 to the left heel and the other to the right medial [name]. She claims she is wearing her Prevalon [name] all the time although she  does not have them on today. She currently denies signs of infection. 2/21; patient presents for follow-up. She was recently hospitalized on 12/19/2022 for sepsis secondary to Enterococcus bacillus bacteremia. She was treated with IV antibiotics and has completed her discharge oral antibiotics. She had a  CT scanning of the abdomen/pelvis without acute abnormalities. Husband is present with patient today. They have been using Dakin's wet-to-dry dressings to the sacral wound and hip wound and Hydrofera Blue and Medihoney to the feet wounds. They are not changing the dressings daily. It is unclear how often they actually change the dressings. She has been wearing her Prevalon [name] at night but not during the day. She currently denies systemic signs of infection. 3/20; patient presents for follow-up. She again was in the hospital for severe sepsis on 2/24; MRIs at that time showed findings suspicious for osteomyelitis to the right hip. She is currently on Augmentin to complete 4 weeks of this. She is following with ID. She has been using Dakin's wet-to-dry dressings to the sacrum and right hip however the solution is starting to cause discomfort. T the rest of the wound she has been using Hydrofera Blue and Medihoney. Patient o and son are not interested in doing palliative care or hospice. They state that they had discussions while in the hospital. 4/17; patient presents for follow-up. She has been using Vashe wet-to-dry dressings to the sacrum and right trochanter wound. She has been using Medihoney and Hydrofera Blue to the bilateral feet wounds. She has no issues or complaints today. She denies signs of infection. 5/1; patient developed a new wound to the right leg. Son was present and states that there was a scab and he removed it creating a wound. He has been keeping the area covered. T the sacral and right trochanter wound they have been using Vashe wet-to-dry dressings and Medihoney and Hydrofera Blue to the o bilateral feet wounds. She denies signs of infection. 6/1; 1 month follow-up. She has a new area on the left anterior lower leg. In the meantime the sacral ulcer is closed. She still has an extensive wound on the right hip with exposed tendon over the greater trochanter. On the lower  extremities the left anterior lower leg wound is new right [name] left heel and right heel all look reasonably stable. We are using Vashe wet-to-dry on the hip Medihoney and Hydrofera Blue on the lower extremities. She is accompanied by her son I think who is doing the dressings. They do not have a pressure-relief surface at home but they do have a hospital bed 7/10; I have not seen this patient since 03/13/2023. She was scheduled to be seen on 5/29 however missed her appointment due to illness. She was seen on 6/5 by Dr. Leanord Hawking and she presents today for her follow-up. Unfortunately her wounds have declined since I last saw her. She has several new wounds including one to the left hip wound and one left anterior lower leg. The right hip wound has declined and has maggots with bone exposed. She has been using Vashe wet- to-dry dressings to Right hip wound and Medihoney and Hydrofera Blue to the The remaining wounds. Sacral wound remains closed. 7/24; patient presents for follow-up. At last clinic visit I recommended she go to the ED for potential admission, IV antibiotics and further imaging. She had MRI of the pelvis and left [name] that showed osteomyelitis. Podiatry advised conservative management. She was also seen by infectious disease who discharged patient on 2 weeks  of linezolid and 2 weeks of ciprofloxacin. Patient is at home and has home health. Palliative care was consulted during the stay and she is full code and has declined further outpatient palliative care and hospice services.. She currently denies systemic signs of infection. Overall there is improvement in the appearance of the wounds. There is healthier granulation tissue present. However this is overall poor prognosis case. She has Prevalon [name] but it is unclear if she is using these. She does not have them on today. She follows up with infectious disease next week. 8/7; Patient's been using Vashe wet-to-dry dressing to all the wound  beds except for the left hip she has been using Santyl. She followed up with infectious disease on 8/1. Currently she is on linezolid and ciprofloxacin. They recommended lab work to see if she needs extension of her oral antibiotics. She currently denies systemic signs of infection. 8/21; patient presents for follow-up. She has been using Vashe wet-to-dry dressings to all wounds except the left hip she has been using Santyl. All wounds have improved in size and appearance. She has healthy granulation tissue present to all wounds except the left hip still has nonviable tissue throughout. She states she has completed her oral antibiotics per ID. She currently denies systemic signs of infection. 9/4; patient presents for follow-up. She has been using Vashe wet-to-dry dressings to all wounds except the left hip. T the left hip she has been using Santyl. o All wounds appear well-healing. She currently denies systemic signs of infection. 9/18; 2-week follow-up. This is a very disabled woman who is nonambulatory. 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 She has significant bilateral lower extremity pressure ulcers bilaterally. 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 She is not ambulatory. She has extensive wounds on both trochanters. The right has exposed bone in the left nonviable tissue. I aggressively debrided this the last time she was here. We have been using Santyl on the left hip and Vashe wet-to-dry on the right hip. She has wounds on her bilateral lower legs extensively on the right medial [name] first met head [name] and lower leg area on the left lower leg and left heel. All of these seem somewhat improved again using Vashe wet-to-dry. We have been attempting to get a level 3 surface or some form of offloading surface for the  patient although having trouble with their insurance which apparently is [facility], [name] ([id number]) [id number] Page 10 of 13 10/16; patient is doing very well. The wounds on the lower extremity even the extensive areas seem to be a lot better with healthy granulation. We have been using the [employer] equivalent of Vashe wet-to-dry. There is areas over the lateral part of the greater trochanters. The area on the left looks a lot better we have been using Santyl with backing wet to dry gauze. We have been using Vashe wet-to-dry in the right greater trochanter. I think we are in line with changing the dressing next week to both hip areas We are apparently finally making some inroads in getting her a level 3 surface for her bed 10/23; patient continues to make nice improvement in the bilateral lower leg wounds using the equivalent of Vashe wet-to-dry. I did plan to change this dressing but the wounds are making such a nice improvement I continued it.  T oday I have changed the greater trochanter stage IV pressure ulcers to a Prisma and then hydrogel wet-to-dry packing with border foam change every 2 D. Really remarkable improvement credit to the family this looking after her 09-18-2023 upon evaluation today patient appears to be doing more poorly especially in regard to her heels and lower extremities where she has deep tissue injuries on both heels and she has new blisters that have opened up on her legs. This is due to the fact that she is not elevating her legs she is staying in her wheelchair sitting not moving around throughout the day and this is not the best thing for her to be honest. Fortunately I do not think she is infected right now but at the same time this is still concerning to me. 10-02-2023 upon evaluation today patient appears to be doing really about the same in regard to her wounds. There is not much improvement here unfortunately. I do not see any signs  of active infection at this time which is good news. With that being said she still has significant wounds on both hips the left is better than the right she also has many wounds scattered over her legs as well as both heels. 10-23-2023 patient continues to have significant issues with her wounds in general. With that being said fortunately I do not see any signs of active infection at this time which is great news and in general I believe that we are making decent progress all things considered at this point. The heels are 1 exception she continues to have too much pressure to the heels unfortunately. With all that being said the patient is unfortunately still not using the offloading [name] as much as she should be. I explained to her that this is going be of utmost importance that she is can to see this heal and get better. She voiced understanding and her son states they will begin using them on a regular basis. Objective Constitutional Well-nourished and well-hydrated in no acute distress. Vitals Time Taken: 4:08 PM, Weight: 98 lbs, Temperature: 98.0 F, Pulse: 93 bpm, Respiratory Rate: 18 breaths/min, Blood Pressure: 106/60 mmHg. Respiratory normal breathing without difficulty. Psychiatric this patient is able to make decisions and demonstrates good insight into disease process. Alert and Oriented x 3. pleasant and cooperative. General Notes: Patient's wounds all show signs of varying degrees of improvement. Based on what I am seeing I do believe that we are doing well with the current wound care measures and my suggestion is gena be as such that we continue with the current plans. She is in agreement with that plan. Integumentary (Hair, Skin) Wound #11 status is Open. Original cause of wound was Pressure Injury. The date acquired was: 10/24/2022. The wound has been in [name] 51 weeks. The wound is located on the Right Trochanter. The wound measures 2.2cm length x 2cm width x 1.4cm depth;  3.456cm^2 area and 4.838cm^3 volume. There is bone, muscle, tendon, Fat Layer (Subcutaneous Tissue), and fascia exposed. There is undermining starting at 3:00 and ending at 6:00 with a maximum distance of 1.3cm. There is a medium amount of serosanguineous drainage noted. The wound margin is well defined and not attached to the wound base. There is large (67-100%) red granulation within the wound bed. There is no necrotic tissue within the wound bed. Wound #13 status is Open. Original cause of wound was Pressure Injury. The date acquired was: 11/28/2022. The wound has been in [name] 46 weeks. The  wound is located on the Left Calcaneus. The wound measures 3.2cm length x 3cm width x 0.1cm depth; 7.54cm^2 area and 0.754cm^3 volume. There is Fat Layer (Subcutaneous Tissue) exposed. There is a medium amount of serosanguineous drainage noted. The wound margin is flat and intact. There is medium (34-66%) pink granulation within the wound bed. There is a medium (34-66%) amount of necrotic tissue within the wound bed including Eschar and Adherent Slough. Wound #14 status is Open. Original cause of wound was Gradually Appeared. The date acquired was: 03/10/2023. The wound has been in [name] 32 weeks. The wound is located on the Right,Distal,Medial Lower Leg. The wound measures 3.2cm length x 4cm width x 0.1cm depth; 10.053cm^2 area and 1.005cm^3 volume. There is Fat Layer (Subcutaneous Tissue) exposed. There is a large amount of serosanguineous drainage noted. There is large (67-100%) red granulation within the wound bed. There is a small (1-33%) amount of necrotic tissue within the wound bed including Adherent Slough. Wound #15 status is Open. Original cause of wound was Pressure Injury. The date acquired was: 04/11/2023. The wound has been in [name] 27 weeks. The wound is located on the Left,Anterior Lower Leg. The wound measures 7.8cm length x 10.2cm width x 0.1cm depth; 62.486cm^2 area and 6.249cm^3  volume. There is tendon and Fat Layer (Subcutaneous Tissue) exposed. There is a medium amount of serosanguineous drainage noted. There is no granulation within the wound bed. There is no necrotic tissue within the wound bed. Wound #16 status is Open. Original cause of wound was Pressure Injury. The date acquired was: 05/22/2023. The wound has been in [name] 22 weeks. The wound is located on the Left Trochanter. The wound measures 1cm length x 1cm width x 1cm depth; 0.785cm^2 area and 0.785cm^3 volume. There is Fat Layer (Subcutaneous Tissue) exposed. There is tunneling at 7:00 with a maximum distance of 1.8cm. There is a medium amount of serosanguineous drainage noted. There is a large (67-100%) amount of necrotic tissue within the wound bed including Eschar and Adherent Slough. Wound #18 status is Open. Original cause of wound was Pressure Injury. The date acquired was: 07/31/2023. The wound has been in [name] 12 weeks. The wound is located on the Right Metatarsal head first. The wound measures 0.7cm length x 0.7cm width x 0.1cm depth; 0.385cm^2 area and 0.038cm^3 volume. There is Fat Layer (Subcutaneous Tissue) exposed. There is a medium amount of serosanguineous drainage noted. There is medium (34-66%) red granulation within the wound bed. There is a medium (34-66%) amount of necrotic tissue within the wound bed including Eschar and Adherent Slough. Wound #19 status is Open. Original cause of wound was Blister. The date acquired was: 09/17/2023. The wound has been in [name] 5 weeks. The wound is located on the Right,Anterior Lower Leg. The wound measures 4cm length x 8.2cm width x 0.1cm depth; 25.761cm^2 area and 2.576cm^3 volume. There is Fat Layer (Subcutaneous Tissue) exposed. There is a medium amount of serosanguineous drainage noted. Wound #6 status is Open. Original cause of wound was Pressure Injury. The date acquired was: 07/23/2022. The wound has been in [name] 56 weeks.  The [name], [name] ([id number]) [id number] Page 11 of 13 wound is located on the Right Calcaneus. The wound measures 6cm length x 5.2cm width x 0.1cm depth; 24.504cm^2 area and 2.45cm^3 volume. There is Fat Layer (Subcutaneous Tissue) exposed. There is a medium amount of serosanguineous drainage noted. The wound margin is distinct with the outline attached to the wound base. There is medium (34-66%) red, pink  granulation within the wound bed. There is a medium (34-66%) amount of necrotic tissue within the wound bed including Eschar and Adherent Slough. Assessment Active Problems ICD-10 Non-pressure chronic ulcer of other part of left lower leg with fat layer exposed Non-pressure chronic ulcer of other part of right lower leg with fat layer exposed Chronic venous hypertension (idiopathic) with ulcer of left lower extremity Lymphedema, not elsewhere classified Type 2 diabetes mellitus with other skin ulcer Pressure ulcer of right heel, unstageable Pressure ulcer of right [name], stage 3 Pressure ulcer of left heel, unstageable Other acute osteomyelitis, left [name] and [name] Pressure ulcer of right hip, stage 4 Pressure ulcer of left hip, unstageable Chronic diastolic (congestive) heart failure Long term (current) use of oral hypoglycemic drugs Plan Follow-up Appointments: Return Appointment in 2 weeks. Home Health: Home Health Company: - Harlin Rain, RN [phone] [facility] for wound care. May utilize formulary equivalent dressing for wound [name] orders unless otherwise specified. Home Health Nurse may visit PRN to address patients wound care needs. Off-Loading: Low air-loss mattress (Group 2) WOUND #11: - Trochanter Wound Laterality: Right Cleanser: Vashe 5.8 (oz) 1 x Per Day/30 Days Discharge Instructions: Use as directed for cleaning wound Prim Dressing: Prisma 4.34 (in) 1 x Per Day/30 Days ary Discharge Instructions: pack into  tunnel and wound Secondary Dressing: Gauze 1 x Per Day/30 Days Discharge Instructions: moisten with Vashe and place over collagen Secondary Dressing: (BORDER) Zetuvit Plus SILICONE BORDER Dressing 5x5 (in/in) 1 x Per Day/30 Days Discharge Instructions: Please do not put silicone bordered dressings under [name]. Use non-bordered dressing only. WOUND #13: - Calcaneus Wound Laterality: Left Cleanser: Vashe 5.8 (oz) (Home Health) 1 x Per Day/30 Days Discharge Instructions: damp to dry daily Topical: Betadine 1 x Per Day/30 Days Discharge Instructions: Apply betadine to heel Secondary Dressing: ABD Pad 5x9 (in/in) 1 x Per Day/30 Days Discharge Instructions: Cover with ABD pad Secondary Dressing: Kerlix 4.5 x 4.1 (in/yd) 1 x Per Day/30 Days Discharge Instructions: Apply Kerlix 4.5 x 4.1 (in/yd) as instructed Secured With: ACE WRAP - 40M ACE Elastic Bandage With VELCRO Brand Closure, 4 (in) 1 x Per Day/30 Days WOUND #14: - Lower Leg Wound Laterality: Right, Medial, Distal Cleanser: Vashe 5.8 (oz) (Home Health) 1 x Per Day/30 Days Discharge Instructions: damp to dry daily Prim Dressing: Xeroform 5x9-HBD (in/in) 1 x Per Day/30 Days ary Discharge Instructions: Apply Xeroform 5x9-HBD (in/in) as directed Secondary Dressing: ABD Pad 5x9 (in/in) 1 x Per Day/30 Days Discharge Instructions: Cover with ABD pad Secondary Dressing: Kerlix 4.5 x 4.1 (in/yd) 1 x Per Day/30 Days Discharge Instructions: Apply Kerlix 4.5 x 4.1 (in/yd) as instructed Secured With: ACE WRAP - 40M ACE Elastic Bandage With VELCRO Brand Closure, 4 (in) 1 x Per Day/30 Days WOUND #15: - Lower Leg Wound Laterality: Left, Anterior Cleanser: Vashe 5.8 (oz) (Home Health) 1 x Per Day/30 Days Discharge Instructions: damp to dry daily Prim Dressing: Xeroform 5x9-HBD (in/in) 1 x Per Day/30 Days ary Discharge Instructions: Apply Xeroform 5x9-HBD (in/in) as directed Secondary Dressing: ABD Pad 5x9 (in/in) 1 x Per Day/30 Days Discharge  Instructions: Cover with ABD pad Secondary Dressing: Kerlix 4.5 x 4.1 (in/yd) 1 x Per Day/30 Days Discharge Instructions: Apply Kerlix 4.5 x 4.1 (in/yd) as instructed Secured With: ACE WRAP - 40M ACE Elastic Bandage With VELCRO Brand Closure, 4 (in) 1 x Per Day/30 Days WOUND #16: - Trochanter Wound Laterality: Left Cleanser: Vashe 5.8 (oz) 1 x Per Day/30 Days Discharge Instructions: Use as directed  for cleaning wound Prim Dressing: Prisma 4.34 (in) 1 x Per Day/30 Days ary Discharge Instructions: pack into tunnel and wound Secondary Dressing: Gauze 1 x Per Day/30 Days Discharge Instructions: moisten with Vashe and place over collagen Secondary Dressing: (BORDER) Zetuvit Plus SILICONE BORDER Dressing 5x5 (in/in) 1 x Per Day/30 Days Discharge Instructions: Please do not put silicone bordered dressings under [name]. Use non-bordered dressing only. [name], [name] ([id number]) [id number] Page 12 of 13 WOUND #18: - Metatarsal head first Wound Laterality: Right Cleanser: Vashe 5.8 (oz) (Home Health) 1 x Per Day/30 Days Discharge Instructions: damp to dry daily Prim Dressing: Xeroform 5x9-HBD (in/in) 1 x Per Day/30 Days ary Discharge Instructions: Apply Xeroform 5x9-HBD (in/in) as directed Secondary Dressing: ABD Pad 5x9 (in/in) 1 x Per Day/30 Days Discharge Instructions: Cover with ABD pad Secondary Dressing: Kerlix 4.5 x 4.1 (in/yd) 1 x Per Day/30 Days Discharge Instructions: Apply Kerlix 4.5 x 4.1 (in/yd) as instructed Secured With: ACE WRAP - 71M ACE Elastic Bandage With VELCRO Brand Closure, 4 (in) 1 x Per Day/30 Days WOUND #19: - Lower Leg Wound Laterality: Right, Anterior Cleanser: Vashe 5.8 (oz) (Home Health) 1 x Per Day/30 Days Discharge Instructions: damp to dry daily Prim Dressing: Xeroform 5x9-HBD (in/in) 1 x Per Day/30 Days ary Discharge Instructions: Apply Xeroform 5x9-HBD (in/in) as directed Secondary Dressing: ABD Pad 5x9 (in/in) 1 x Per Day/30  Days Discharge Instructions: Cover with ABD pad Secondary Dressing: Kerlix 4.5 x 4.1 (in/yd) 1 x Per Day/30 Days Discharge Instructions: Apply Kerlix 4.5 x 4.1 (in/yd) as instructed Secured With: ACE WRAP - 71M ACE Elastic Bandage With VELCRO Brand Closure, 4 (in) 1 x Per Day/30 Days WOUND #6: - Calcaneus Wound Laterality: Right Cleanser: Vashe 5.8 (oz) (Home Health) 1 x Per Day/30 Days Discharge Instructions: damp to dry daily Prim Dressing: Xeroform 5x9-HBD (in/in) 1 x Per Day/30 Days ary Discharge Instructions: Apply Xeroform 5x9-HBD (in/in) as directed Secondary Dressing: ABD Pad 5x9 (in/in) 1 x Per Day/30 Days Discharge Instructions: Cover with ABD pad Secondary Dressing: Kerlix 4.5 x 4.1 (in/yd) 1 x Per Day/30 Days Discharge Instructions: Apply Kerlix 4.5 x 4.1 (in/yd) as instructed Secured With: ACE WRAP - 71M ACE Elastic Bandage With VELCRO Brand Closure, 4 (in) 1 x Per Day/30 Days 1. I would recommend based on what we are seeing that we have the patient going to continue to monitor for any signs of infection or worsening. I do believe that we are seeing really good improvement here as far as the healing is concerned I am very pleased in that regard in regard to her legs and even the hips are showing some improvement. With that being said the heels are not doing nearly as well. 2. For the legs we will continue with the Xeroform, for the hips we will continue with the Dakin's moistened gauze packing, for the heels wearing a continue with the Betadine for now and she should be wearing her offloading [name] at all times. We will see patient back for reevaluation in 2 weeks here in the clinic. If anything worsens or changes patient will contact our office for additional recommendations. Electronic Signature(s) Signed: 10/23/2023 5:51:08 PM By: [name] Derry PA-C Entered By: [name] on 10/23/2023  17:51:08 -------------------------------------------------------------------------------- SuperBill Details Patient Name: Date of Service: [facility], [name] [name]. 10/23/2023 Medical Record Number: [id number] Patient Account Number: [id number] Date of Birth/Sex: Treating RN: 1941-10-26 (82 y.o. [name] Primary Care Provider: Barbette [name] Other Clinician: Betha [name] Referring Provider: Treating Provider/Extender:  Stone, Brystal Kildow [place], Vishwanath Weeks in [name]: 58 Diagnosis Coding ICD-10 Codes Code Description [phone] Non-pressure chronic ulcer of other part of left lower leg with fat layer exposed L97.812 Non-pressure chronic ulcer of other part of right lower leg with fat layer exposed I87.312 Chronic venous hypertension (idiopathic) with ulcer of left lower extremity I89.0 Lymphedema, not elsewhere classified E11.622 Type 2 diabetes mellitus with other skin ulcer L89.610 Pressure ulcer of right heel, unstageable [name], [name] ([id number]) [id number] Page 13 of 13 L89.513 Pressure ulcer of right [name], stage 3 L89.620 Pressure ulcer of left heel, unstageable M86.172 Other acute osteomyelitis, left [name] and [name] L89.214 Pressure ulcer of right hip, stage 4 L89.220 Pressure ulcer of left hip, unstageable I50.32 Chronic diastolic (congestive) heart failure Z79.84 Long term (current) use of oral hypoglycemic drugs Facility Procedures : CPT4 Code: [id number] Description: 62130 - WOUND CARE VISIT-LEV 5 EST PT Modifier: Quantity: 1 Physician Procedures : CPT4 Code Description Modifier [id number] - WC PHYS LEVEL 3 - EST PT ICD-10 Diagnosis Description L97.822 Non-pressure chronic ulcer of other part of left lower leg with fat layer exposed L97.812 Non-pressure chronic ulcer of other part of right  lower leg with fat layer exposed I87.312 Chronic venous hypertension (idiopathic) with ulcer of left lower extremity I89.0 Lymphedema, not  elsewhere classified Quantity: 1 Electronic Signature(s) Signed: 10/23/2023 5:51:55 PM By: [name] Derry PA-C Previous Signature: 10/23/2023 5:40:51 PM Version By: [name] Entered By: [name] on 10/23/2023 17:51:54

## 2023-10-24 NOTE — Progress Notes (Signed)
KINDELL, HISLOP ([id number]) [id number] Page 1 of 20 Visit Report for 10/23/2023 Arrival Information Details Patient Name: Date of Service: [facility] [place] [name] 10/23/2023 3:30 PM Medical Record Number: [id number] Patient Account Number: [id number] Date of Birth/Sex: Treating RN: 1941-01-17 (82 y.o. [name] Primary Care Cloys Vera: Barbette Reichmann Other Clinician: Betha Loa Referring Joselynn Amoroso: Treating Adelaine Roppolo/Extender: Jearld Shines Weeks in Treatment: 76 Visit Information History Since Last Visit All ordered tests and consults were completed: No Patient Arrived: Wheel Chair Added or deleted any medications: No Arrival Time: 16:06 Any new allergies or adverse reactions: No Transfer Assistance: EasyPivot Patient Lift Had a fall or experienced change in No Patient Identification Verified: Yes activities of daily living that may affect Secondary Verification Process Completed: Yes risk of falls: Patient Requires Transmission-Based Precautions: No Signs or symptoms of abuse/neglect since last visito No Patient Has Alerts: Yes Hospitalized since last visit: No Patient Alerts: DM II Implantable device outside of the clinic excluding No ABI R 1.30 TBI 1.12 cellular tissue based products placed in the center ABI L 1.27 TBI 1.21 since last visit: Has Dressing in Place as Prescribed: Yes Pain Present Now: Yes Electronic Signature(s) Signed: 10/23/2023 5:40:51 PM By: Betha Loa Entered By: Betha Loa on 10/23/2023 16:08:04 -------------------------------------------------------------------------------- Clinic Level of Care Assessment Details Patient Name: Date of Service: [facility], [name] 10/23/2023 3:30 PM Medical Record Number: [id number] Patient Account Number: [id number] Date of Birth/Sex: Treating RN: Aug 20, 1941 (82 y.o. [name] Primary Care Parker Sawatzky: Barbette Reichmann Other Clinician: Betha Loa Referring Malcomb Gangemi: Treating Aerin Delany/Extender: Jearld Shines Weeks in Treatment: 97 Clinic Level of Care Assessment Items TOOL 4 Quantity Score []  - 0 Use when only an EandM is performed on FOLLOW-UP visit ASSESSMENTS - Nursing Assessment / Reassessment X- 1 10 Reassessment of Co-morbidities (includes updates in patient status) X- 1 5 Reassessment of Adherence to Treatment Plan AFSANA, SHAULIS L ([id number]) [phone].pdf Page 2 of 20 ASSESSMENTS - Wound and Skin A ssessment / Reassessment []  - 0 Simple Wound Assessment / Reassessment - one wound X- 8 5 Complex Wound Assessment / Reassessment - multiple wounds []  - 0 Dermatologic / Skin Assessment (not related to wound area) ASSESSMENTS - Focused Assessment []  - 0 Circumferential Edema Measurements - multi extremities []  - 0 Nutritional Assessment / Counseling / Intervention []  - 0 Lower Extremity Assessment (monofilament, tuning fork, pulses) []  - 0 Peripheral Arterial Disease Assessment (using hand held doppler) ASSESSMENTS - Ostomy and/or Continence Assessment and Care []  - 0 Incontinence Assessment and Management []  - 0 Ostomy Care Assessment and Management (repouching, etc.) PROCESS - Coordination of Care X - Simple Patient / Family Education for ongoing care 1 15 []  - 0 Complex (extensive) Patient / Family Education for ongoing care []  - 0 Staff obtains Chiropractor, Records, T Results / Process Orders est []  - 0 Staff telephones HHA, Nursing Homes / Clarify orders / etc []  - 0 Routine Transfer to another Facility (non-emergent condition) []  - 0 Routine Hospital Admission (non-emergent condition) []  - 0 New Admissions / Manufacturing engineer / Ordering NPWT Apligraf, etc. , []  - 0 Emergency Hospital Admission (emergent condition) X- 1 10 Simple Discharge Coordination []  - 0 Complex (extensive) Discharge Coordination PROCESS - Special Needs []  -  0 Pediatric / Minor Patient Management []  - 0 Isolation Patient Management []  - 0 Hearing / Language / Visual special needs []  - 0 Assessment of Community assistance (transportation, D/C planning, etc.) []  - 0 Additional  assistance / Altered mentation []  - 0 Support Surface(s) Assessment (bed, cushion, seat, etc.) INTERVENTIONS - Wound Cleansing / Measurement []  - 0 Simple Wound Cleansing - one wound X- 8 5 Complex Wound Cleansing - multiple wounds X- 1 5 Wound Imaging (photographs - any number of wounds) []  - 0 Wound Tracing (instead of photographs) []  - 0 Simple Wound Measurement - one wound X- 8 5 Complex Wound Measurement - multiple wounds INTERVENTIONS - Wound Dressings []  - 0 Small Wound Dressing one or multiple wounds X- 8 15 Medium Wound Dressing one or multiple wounds []  - 0 Large Wound Dressing one or multiple wounds []  - 0 Application of Medications - topical []  - 0 Application of Medications - injection INTERVENTIONS - Miscellaneous []  - 0 External ear exam Silfies, Aune L ([id number]) [id number] Page 3 of 20 []  - 0 Specimen Collection (cultures, biopsies, blood, body fluids, etc.) []  - 0 Specimen(s) / Culture(s) sent or taken to Lab for analysis []  - 0 Patient Transfer (multiple staff / Michiel Sites Lift / Similar devices) []  - 0 Simple Staple / Suture removal (25 or less) []  - 0 Complex Staple / Suture removal (26 or more) []  - 0 Hypo / Hyperglycemic Management (close monitor of Blood Glucose) []  - 0 Ankle / Brachial Index (ABI) - do not check if billed separately X- 1 5 Vital Signs Has the patient been seen at the hospital within the last three years: Yes Total Score: 290 Level Of Care: New/Established - Level 5 Electronic Signature(s) Signed: 10/23/2023 5:40:51 PM By: Betha Loa Entered By: Betha Loa on 10/23/2023  17:31:22 -------------------------------------------------------------------------------- Encounter Discharge Information Details Patient Name: Date of Service: [facility], [name] 10/23/2023 3:30 PM Medical Record Number: [id number] Patient Account Number: [id number] Date of Birth/Sex: Treating RN: Jun 27, 1941 (82 y.o. [name] Primary Care Imogene Gravelle: Barbette Reichmann Other Clinician: Betha Loa Referring Darci Lykins: Treating Hugh Garrow/Extender: Jearld Shines Weeks in Treatment: 64 Encounter Discharge Information Items Discharge Condition: Stable Ambulatory Status: Wheelchair Discharge Destination: Home Transportation: Private Auto Accompanied By: son Schedule Follow-up Appointment: Yes Clinical Summary of Care: Electronic Signature(s) Signed: 10/23/2023 5:40:51 PM By: Betha Loa Entered By: Betha Loa on 10/23/2023 17:33:10 Lower Extremity Assessment Details -------------------------------------------------------------------------------- Nicola Police ([id number]) [id number] Page 4 of 20 Patient Name: Date of Service: [facility], [name] 10/23/2023 3:30 PM Medical Record Number: [id number] Patient Account Number: [id number] Date of Birth/Sex: Treating RN: 1941-04-15 (82 y.o. [name] Primary Care Keigan Tafoya: Barbette Reichmann Other Clinician: Betha Loa Referring Shewanda Sharpe: Treating Brendan Gadson/Extender: Karle Plumber, Vishwanath Weeks in Treatment: 62 Electronic Signature(s) Signed: 10/23/2023 5:40:51 PM By: Betha Loa Signed: 10/24/2023 2:31:23 PM By: Yevonne Pax RN Entered By: Betha Loa on 10/23/2023 16:36:33 -------------------------------------------------------------------------------- Multi Wound Chart Details Patient Name: Date of Service: [facility], [name] 10/23/2023 3:30 PM Medical Record Number: [id number] Patient Account Number: [id number] Date of Birth/Sex: Treating  RN: July 04, 1941 (82 y.o. [name] Primary Care Gabrille Kilbride: Barbette Reichmann Other Clinician: Betha Loa Referring Dameka Younker: Treating Neala Miggins/Extender: Jearld Shines Weeks in Treatment: 67 Vital Signs Height(in): Pulse(bpm): 93 Weight(lbs): 98 Blood Pressure(mmHg): 106/60 Body Mass Index(BMI): Temperature(F): 98.0 Respiratory Rate(breaths/min): 18 [11:Photos:] Right Trochanter Left Calcaneus Right, Distal, Medial Lower Leg Wound Location: Pressure Injury Pressure Injury Gradually Appeared Wounding Event: Pressure Ulcer Pressure Ulcer Diabetic Wound/Ulcer of the Lower Primary Etiology: Extremity Cataracts, Lymphedema, Cataracts, Lymphedema, Cataracts, Lymphedema, Comorbid History: Hypertension, Peripheral Arterial Hypertension, Peripheral Arterial Hypertension, Peripheral Arterial Disease, Peripheral Venous Disease, Disease, Peripheral Venous  Disease, Disease, Peripheral Venous Disease, Type II Diabetes, Osteoarthritis, Type II Diabetes, Osteoarthritis, Type II Diabetes, Osteoarthritis, Neuropathy Neuropathy Neuropathy 10/24/2022 11/28/2022 03/10/2023 Date Acquired: 51 46 32 Weeks of Treatment: Open Open Open Wound Status: No No No Wound Recurrence: No No Yes Clustered Wound: 2.2x2x1.4 3.2x3x0.1 3.2x4x0.1 Measurements L x W x D (cm) 3.456 7.54 10.053 A (cm) : rea 4.838 0.754 1.005 Volume (cm) : -66.70% -118.20% -82.80% % Reduction in A rea: -2237.20% -117.90% -82.70% % Reduction in Volume: 3 Starting Position 1 (o'clock): 6 Ending Position 1 (o'clock): 1.3 Maximum Distance 1 (cm): N/A N/A N/A Tunneling: Yes N/A N/A UnderminingEmma-Jane Velazques, Malashia L ([id number]) [id number] Page 5 of 20 Category/Stage IV Category/Stage III Grade 2 Classification: Medium Medium Large Exudate A mount: Serosanguineous Serosanguineous Serosanguineous Exudate Type: red, brown red, brown red, brown Exudate Color: Well defined,  not attached Flat and Intact N/A Wound Margin: Large (67-100%) Medium (34-66%) Large (67-100%) Granulation A mount: Red Pink Red Granulation Quality: None Present (0%) Medium (34-66%) Small (1-33%) Necrotic A mount: N/A Eschar, Adherent Slough Adherent Slough Necrotic Tissue: Fascia: Yes Fat Layer (Subcutaneous Tissue): Yes Fat Layer (Subcutaneous Tissue): Yes Exposed Structures: Fat Layer (Subcutaneous Tissue): Yes Fascia: No Fascia: No Tendon: Yes Tendon: No Tendon: No Muscle: Yes Muscle: No Muscle: No Bone: Yes Joint: No Joint: No Joint: No Bone: No Bone: No None None None Epithelialization: Wound Number: [id number]  Photos: Left, Anterior Lower Leg Left Trochanter Right Metatarsal head first Wound Location: Pressure Injury Pressure Injury Pressure Injury Wounding Event: Pressure Ulcer Pressure Ulcer Pressure Ulcer Primary Etiology: Cataracts, Lymphedema, Cataracts, Lymphedema, Cataracts, Lymphedema, Comorbid History: Hypertension, Peripheral Arterial Hypertension, Peripheral Arterial Hypertension, Peripheral Arterial Disease, Peripheral Venous Disease, Disease, Peripheral Venous Disease, Disease, Peripheral Venous Disease, Type II Diabetes, Osteoarthritis, Type II Diabetes, Osteoarthritis, Type II Diabetes, Osteoarthritis, Neuropathy Neuropathy Neuropathy 04/11/2023 05/22/2023 07/31/2023 Date Acquired: 27 22 12  Weeks of Treatment: Open Open Open Wound Status: No No No Wound Recurrence: No No No Clustered Wound: 7.8x10.2x0.1 1x1x1 0.7x0.7x0.1 Measurements L x W x D (cm) 62.486 0.785 0.385 A (cm) : rea 6.249 0.785 0.038 Volume (cm) : -178.60% 75.90% 48.40% % Reduction in A rea: -178.60% -141.50% 49.30% % Reduction in Volume: 7 Position 1 (o'clock): 1.8 Maximum Distance 1 (cm): N/A Yes N/A Tunneling: N/A N/A N/A Undermining: Category/Stage IV Unstageable/Unclassified Category/Stage II Classification: Medium Medium Medium Exudate A  mount: Serosanguineous Serosanguineous Serosanguineous Exudate Type: red, brown red, brown red, brown Exudate Color: N/A N/A N/A Wound Margin: None Present (0%) N/A Medium (34-66%) Granulation A mount: N/A N/A Red Granulation Quality: None Present (0%) N/A Medium (34-66%) Necrotic A mount: N/A Eschar, Adherent Slough Eschar, Adherent Slough Necrotic Tissue: Fat Layer (Subcutaneous Tissue): Yes Fat Layer (Subcutaneous Tissue): Yes Fat Layer (Subcutaneous Tissue): Yes Exposed Structures: Tendon: Yes Fascia: No Fascia: No Fascia: No Tendon: No Tendon: No Muscle: No Muscle: No Muscle: No Joint: No Joint: No Joint: No Bone: No Bone: No Bone: No Large (67-100%) N/A None Epithelialization: Wound Number: [id number] N/A Photos: N/A Right, Anterior Lower Leg Right Calcaneus N/A Wound Location: Blister Pressure Injury N/A Wounding Event: Venous Leg Ulcer Pressure Ulcer N/A Primary Etiology: Cataracts, Lymphedema, Cataracts, Lymphedema, N/A Comorbid History: Hypertension, Peripheral Arterial Hypertension, Peripheral Arterial Disease, Peripheral Venous Disease, Disease, Peripheral Venous Disease, Type II Diabetes, Osteoarthritis, Type II Diabetes, Osteoarthritis, Greif, Alayasia L ([id number]) [id number] Page 6 of 20 Neuropathy Neuropathy 09/17/2023 07/23/2022 N/A Date Acquired: 5 56 N/A Weeks of Treatment: Open Open N/A Wound Status: No No N/A Wound  Recurrence: Yes No N/A Clustered Wound: 4x8.2x0.1 6x5.2x0.1 N/A Measurements L x W x D (cm) 25.761 24.504 N/A A (cm) : rea 2.576 2.45 N/A Volume (cm) : -264.40% -160.00% N/A % Reduction in Area: -264.40% 13.30% N/A % Reduction in Volume: N/A N/A N/A Tunneling: N/A N/A N/A Undermining: Full Thickness Without Exposed Category/Stage III N/A Classification: Support Structures Medium Medium N/A Exudate A mount: Serosanguineous Serosanguineous N/A Exudate Type: red, brown red, brown  N/A Exudate Color: N/A Distinct, outline attached N/A Wound Margin: N/A Medium (34-66%) N/A Granulation Amount: N/A Red, Pink N/A Granulation Quality: N/A Medium (34-66%) N/A Necrotic Amount: N/A Eschar, Adherent Slough N/A Necrotic Tissue: Fat Layer (Subcutaneous Tissue): Yes Fat Layer (Subcutaneous Tissue): Yes N/A Exposed Structures: Fascia: No Fascia: No Tendon: No Tendon: No Muscle: No Muscle: No Joint: No Joint: No Bone: No Bone: No None None N/A Epithelialization: Treatment Notes Electronic Signature(s) Signed: 10/23/2023 5:40:51 PM By: Betha Loa Entered By: Betha Loa on 10/23/2023 17:29:45 -------------------------------------------------------------------------------- Multi-Disciplinary Care Plan Details Patient Name: Date of Service: [facility], [name] 10/23/2023 3:30 PM Medical Record Number: [id number] Patient Account Number: [id number] Date of Birth/Sex: Treating RN: February 09, 1941 (82 y.o. [name] Primary Care Brock Larmon: Barbette Reichmann Other Clinician: Betha Loa Referring Smt Lokey: Treating Darriana Deboy/Extender: Jearld Shines Weeks in Treatment: 12 Active Inactive Electronic Signature(s) Signed: 10/23/2023 5:40:51 PM By: Betha Loa Signed: 10/24/2023 2:31:23 PM By: Yevonne Pax RN Entered By: Betha Loa on 10/23/2023 17:31:40 Qian, [name] ([id number]) [id number] Page 7 of 20 -------------------------------------------------------------------------------- Pain Assessment Details Patient Name: Date of Service: [facility], [name] 10/23/2023 3:30 PM Medical Record Number: [id number] Patient Account Number: [id number] Date of Birth/Sex: Treating RN: Oct 08, 1941 (82 y.o. [name] Primary Care Trevaris Pennella: Barbette Reichmann Other Clinician: Betha Loa Referring Ryley Teater: Treating Hallee Mckenny/Extender: Jearld Shines Weeks in Treatment: 22 Active  Problems Location of Pain Severity and Description of Pain Patient Has Paino Yes Site Locations Pain Location: Pain in Ulcers Duration of the Pain. Constant / Intermittento Constant Rate the pain. Current Pain Level: 5 Character of Pain Describe the Pain: Burning, Other: stinging Pain Management and Medication Current Pain Management: Medication: Yes Cold Application: No Rest: No Massage: No Activity: No T.E.N.S.: No Heat Application: No Leg drop or elevation: No Is the Current Pain Management Adequate: Inadequate How does your wound impact your activities of daily livingo Sleep: No Bathing: No Appetite: No Relationship With Others: No Bladder Continence: No Emotions: No Bowel Continence: No Work: No Toileting: No Drive: No Dressing: No Hobbies: No Electronic Signature(s) Signed: 10/23/2023 5:40:51 PM By: Betha Loa Signed: 10/24/2023 2:31:23 PM By: Yevonne Pax RN Entered By: Betha Loa on 10/23/2023 16:11:04 Verne, [name] ([id number]) [id number] Page 8 of 20 -------------------------------------------------------------------------------- Patient/Caregiver Education Details Patient Name: Date of Service: [facility], [name] 12/11/2024andnbsp3:30 PM Medical Record Number: [id number] Patient Account Number: [id number] Date of Birth/Gender: Treating RN: 1941/03/22 (82 y.o. [name] Primary Care Physician: Barbette Reichmann Other Clinician: Betha Loa Referring Physician: Treating Physician/Extender: Jearld Shines Weeks in Treatment: 54 Education Assessment Education Provided To: Patient and Caregiver Education Topics Provided Offloading: Handouts: Other: wear Prevalon boots all the time except when transferring Methods: Explain/Verbal Responses: State content correctly Electronic Signature(s) Signed: 10/23/2023 5:40:51 PM By: Betha Loa Entered By: Betha Loa on 10/23/2023  17:32:28 -------------------------------------------------------------------------------- Wound Assessment Details Patient Name: Date of Service: [facility], [name] 10/23/2023 3:30 PM Medical Record Number: [id number] Patient Account Number: [id number] Date of Birth/Sex: Treating  RN: 09-08-1941 (82 y.o. [name] Primary Care Charlynn Salih: Barbette Reichmann Other Clinician: Betha Loa Referring Baelyn Doring: Treating Wava Kildow/Extender: Jearld Shines Weeks in Treatment: 4 Wound Status Wound Number: 11 Primary Pressure Ulcer Etiology: Wound Location: Right Trochanter Wound Open Wounding Event: Pressure Injury Status: Date Acquired: 10/24/2022 Comorbid Cataracts, Lymphedema, Hypertension, Peripheral Arterial Disease, Weeks Of Treatment: 51 History: Peripheral Venous Disease, Type II Diabetes, Osteoarthritis, Clustered Wound: No Neuropathy Photos Jonas, Alycen L ([id number]) [phone].pdf Page 9 of 20 Wound Measurements Length: (cm) 2.2 Width: (cm) 2 Depth: (cm) 1.4 Area: (cm) 3.456 Volume: (cm) 4.838 % Reduction in Area: -66.7% % Reduction in Volume: -2237.2% Epithelialization: None Undermining: Yes Starting Position (o'clock): 3 Ending Position (o'clock): 6 Maximum Distance: (cm) 1.3 Wound Description Classification: Category/Stage IV Wound Margin: Well defined, not attached Exudate Amount: Medium Exudate Type: Serosanguineous Exudate Color: red, brown Foul Odor After Cleansing: No Slough/Fibrino Yes Wound Bed Granulation Amount: Large (67-100%) Exposed Structure Granulation Quality: Red Fascia Exposed: Yes Necrotic Amount: None Present (0%) Fat Layer (Subcutaneous Tissue) Exposed: Yes Tendon Exposed: Yes Muscle Exposed: Yes Necrosis of Muscle: No Joint Exposed: No Bone Exposed: Yes Treatment Notes Wound #11 (Trochanter) Wound Laterality: Right Cleanser Vashe 5.8 (oz) Discharge Instruction: Use as directed  for cleaning wound Peri-Wound Care Topical Primary Dressing Prisma 4.34 (in) Discharge Instruction: pack into tunnel and wound Secondary Dressing Gauze Discharge Instruction: moisten with Vashe and place over collagen (BORDER) Zetuvit Plus SILICONE BORDER Dressing 5x5 (in/in) Discharge Instruction: Please do not put silicone bordered dressings under wraps. Use non-bordered dressing only. Secured With Compression Wrap Compression Stockings Facilities manager) Signed: 10/23/2023 4:37:19 PM By: Midge Aver MSN RN CNS WTA Signed: 10/24/2023 2:31:23 PM By: Yevonne Pax RN Entered By: Midge Aver on 10/23/2023 16:37:18 Sedor, [name] ([id number]) [id number] Page 10 of 20 -------------------------------------------------------------------------------- Wound Assessment Details Patient Name: Date of Service: [facility], [name] 10/23/2023 3:30 PM Medical Record Number: [id number] Patient Account Number: [id number] Date of Birth/Sex: Treating RN: Jun 26, 1941 (82 y.o. [name] Primary Care Blakelee Allington: Barbette Reichmann Other Clinician: Betha Loa Referring Kaylynne Andres: Treating Ahkeem Goede/Extender: Jearld Shines Weeks in Treatment: 21 Wound Status Wound Number: 13 Primary Pressure Ulcer Etiology: Wound Location: Left Calcaneus Wound Open Wounding Event: Pressure Injury Status: Date Acquired: 11/28/2022 Comorbid Cataracts, Lymphedema, Hypertension, Peripheral Arterial Disease, Weeks Of Treatment: 46 History: Peripheral Venous Disease, Type II Diabetes, Osteoarthritis, Clustered Wound: No Neuropathy Photos Wound Measurements Length: (cm) 3.2 Width: (cm) 3 Depth: (cm) 0.1 Area: (cm) 7.5 Volume: (cm) 0.7 % Reduction in Area: -118.2% % Reduction in Volume: -117.9% Epithelialization: None 4 54 Wound Description Classification: Category/Stage III Wound Margin: Flat and Intact Exudate Amount: Medium Exudate  Type: Serosanguineous Exudate Color: red, brown Foul Odor After Cleansing: No Slough/Fibrino Yes Wound Bed Granulation Amount: Medium (34-66%) Exposed Structure Granulation Quality: Pink Fascia Exposed: No Necrotic Amount: Medium (34-66%) Fat Layer (Subcutaneous Tissue) Exposed: Yes Necrotic Quality: Eschar, Adherent Slough Tendon Exposed: No Muscle Exposed: No Joint Exposed: No Bone Exposed: No Treatment Notes Wound #13 (Calcaneus) Wound Laterality: Left Cleanser Vashe 5.8 (oz) Ronda, Aubrei L ([id number]) [id number] Page 11 of 20 Discharge Instruction: damp to dry daily Peri-Wound Care Topical Betadine Discharge Instruction: Apply betadine to heel Primary Dressing Secondary Dressing ABD Pad 5x9 (in/in) Discharge Instruction: Cover with ABD pad Kerlix 4.5 x 4.1 (in/yd) Discharge Instruction: Apply Kerlix 4.5 x 4.1 (in/yd) as instructed Secured With ACE WRAP - 50M ACE Elastic Bandage With VELCRO Brand Closure, 4 (in) Compression Wrap Compression Stockings  Add-Ons Electronic Signature(s) Signed: 10/23/2023 4:37:57 PM By: Midge Aver MSN RN CNS WTA Signed: 10/24/2023 2:31:23 PM By: Yevonne Pax RN Entered By: Midge Aver on 10/23/2023 16:37:56 -------------------------------------------------------------------------------- Wound Assessment Details Patient Name: Date of Service: [facility], [name] 10/23/2023 3:30 PM Medical Record Number: [id number] Patient Account Number: [id number] Date of Birth/Sex: Treating RN: 1941/03/14 (82 y.o. [name] Primary Care Cadee Agro: Barbette Reichmann Other Clinician: Betha Loa Referring Deaja Rizo: Treating Shenell Rogalski/Extender: Jearld Shines Weeks in Treatment: 71 Wound Status Wound Number: 14 Primary Diabetic Wound/Ulcer of the Lower Extremity Etiology: Wound Location: Right, Distal, Medial Lower Leg Wound Open Wounding Event: Gradually Appeared Status: Date Acquired:  03/10/2023 Comorbid Cataracts, Lymphedema, Hypertension, Peripheral Arterial Disease, Weeks Of Treatment: 32 History: Peripheral Venous Disease, Type II Diabetes, Osteoarthritis, Clustered Wound: Yes Neuropathy Photos Wound Measurements Winebarger, Deari L ([id number]) Length: (cm) 3.2 Width: (cm) 4 Depth: (cm) 0.1 Area: (cm) 10.053 Volume: (cm) 1.005 [id number] Page 12 of 20 % Reduction in Area: -82.8% % Reduction in Volume: -82.7% Epithelialization: None Wound Description Classification: Grade 2 Exudate Amount: Large Exudate Type: Serosanguineous Exudate Color: red, brown Foul Odor After Cleansing: No Slough/Fibrino Yes Wound Bed Granulation Amount: Large (67-100%) Exposed Structure Granulation Quality: Red Fascia Exposed: No Necrotic Amount: Small (1-33%) Fat Layer (Subcutaneous Tissue) Exposed: Yes Necrotic Quality: Adherent Slough Tendon Exposed: No Muscle Exposed: No Joint Exposed: No Bone Exposed: No Treatment Notes Wound #14 (Lower Leg) Wound Laterality: Right, Medial, Distal Cleanser Vashe 5.8 (oz) Discharge Instruction: damp to dry daily Peri-Wound Care Topical Primary Dressing Xeroform 5x9-HBD (in/in) Discharge Instruction: Apply Xeroform 5x9-HBD (in/in) as directed Secondary Dressing ABD Pad 5x9 (in/in) Discharge Instruction: Cover with ABD pad Kerlix 4.5 x 4.1 (in/yd) Discharge Instruction: Apply Kerlix 4.5 x 4.1 (in/yd) as instructed Secured With ACE WRAP - 72M ACE Elastic Bandage With VELCRO Brand Closure, 4 (in) Compression Wrap Compression Stockings Add-Ons Electronic Signature(s) Signed: 10/23/2023 4:38:46 PM By: Midge Aver MSN RN CNS WTA Signed: 10/24/2023 2:31:23 PM By: Yevonne Pax RN Entered By: Midge Aver on 10/23/2023 16:38:46 -------------------------------------------------------------------------------- Wound Assessment Details Patient Name: Date of Service: [facility], [name] 10/23/2023 3:30  PM Medical Record Number: [id number] Patient Account Number: [id number] Date of Birth/Sex: Treating RN: 01-Aug-1941 (82 y.o. Shaghayegh, Massie, Zionna L ([id number]) [phone].pdf Page 13 of 20 Primary Care Aalijah Mims: Barbette Reichmann Other Clinician: Betha Loa Referring Darneisha Windhorst: Treating Makinzey Banes/Extender: Karle Plumber, Vishwanath Weeks in Treatment: 83 Wound Status Wound Number: 15 Primary Pressure Ulcer Etiology: Wound Location: Left, Anterior Lower Leg Wound Open Wounding Event: Pressure Injury Status: Date Acquired: 04/11/2023 Comorbid Cataracts, Lymphedema, Hypertension, Peripheral Arterial Disease, Weeks Of Treatment: 27 History: Peripheral Venous Disease, Type II Diabetes, Osteoarthritis, Clustered Wound: No Neuropathy Photos Wound Measurements Length: (cm) 7.8 Width: (cm) 10.2 Depth: (cm) 0.1 Area: (cm) 62.486 Volume: (cm) 6.249 % Reduction in Area: -178.6% % Reduction in Volume: -178.6% Epithelialization: Large (67-100%) Wound Description Classification: Category/Stage IV Exudate Amount: Medium Exudate Type: Serosanguineous Exudate Color: red, brown Foul Odor After Cleansing: No Slough/Fibrino Yes Wound Bed Granulation Amount: None Present (0%) Exposed Structure Necrotic Amount: None Present (0%) Fascia Exposed: No Fat Layer (Subcutaneous Tissue) Exposed: Yes Tendon Exposed: Yes Muscle Exposed: No Joint Exposed: No Bone Exposed: No Treatment Notes Wound #15 (Lower Leg) Wound Laterality: Left, Anterior Cleanser Vashe 5.8 (oz) Discharge Instruction: damp to dry daily Peri-Wound Care Topical Primary Dressing Xeroform 5x9-HBD (in/in) Discharge Instruction: Apply Xeroform 5x9-HBD (in/in) as directed Secondary Dressing ABD Pad 5x9 (in/in) Discharge Instruction:  Cover with ABD pad Kerlix 4.5 x 4.1 (in/yd) Discharge Instruction: Apply Kerlix 4.5 x 4.1 (in/yd) as instructed Secured With ACE WRAP - 39M ACE Elastic  Bandage With VELCRO Brand Closure, 4 (in) Compression Wrap Knopf, Soleia L ([id number]) [phone].pdf Page 14 of 20 Compression Stockings Add-Ons Electronic Signature(s) Signed: 10/23/2023 4:43:54 PM By: Midge Aver MSN RN CNS WTA Signed: 10/24/2023 2:31:23 PM By: Yevonne Pax RN Previous Signature: 10/23/2023 4:39:22 PM Version By: Midge Aver MSN RN CNS WTA Entered By: Midge Aver on 10/23/2023 16:43:54 -------------------------------------------------------------------------------- Wound Assessment Details Patient Name: Date of Service: [facility], [name] 10/23/2023 3:30 PM Medical Record Number: [id number] Patient Account Number: [id number] Date of Birth/Sex: Treating RN: [birth year] (82 y.o. [name] Primary Care Daijah Scrivens: Barbette Reichmann Other Clinician: Betha Loa Referring Loreena Valeri: Treating Adesuwa Osgood/Extender: Jearld Shines Weeks in Treatment: 74 Wound Status Wound Number: 16 Primary Pressure Ulcer Etiology: Wound Location: Left Trochanter Wound Open Wounding Event: Pressure Injury Status: Date Acquired: 05/22/2023 Comorbid Cataracts, Lymphedema, Hypertension, Peripheral Arterial Disease, Weeks Of Treatment: 22 History: Peripheral Venous Disease, Type II Diabetes, Osteoarthritis, Clustered Wound: No Neuropathy Photos Wound Measurements Length: (cm) 1 Width: (cm) 1 Depth: (cm) 1 Area: (cm) 0.785 Volume: (cm) 0.785 % Reduction in Area: 75.9% % Reduction in Volume: -141.5% Tunneling: Yes Position (o'clock): 7 Maximum Distance: (cm) 1.8 Wound Description Classification: Unstageable/Unclassified Exudate Amount: Medium Exudate Type: Serosanguineous Exudate Color: red, brown Foul Odor After Cleansing: No Slough/Fibrino Yes Wound Bed Necrotic Amount: Large (67-100%) Exposed Structure Necrotic Quality: Eschar, Adherent Slough Fascia Exposed: No Fat Layer (Subcutaneous Tissue) Exposed: Yes Tendon  Exposed: No Galvao, Adenike L ([id number]) [id number] Page 15 of 20 Muscle Exposed: No Joint Exposed: No Bone Exposed: No Treatment Notes Wound #16 (Trochanter) Wound Laterality: Left Cleanser Vashe 5.8 (oz) Discharge Instruction: Use as directed for cleaning wound Peri-Wound Care Topical Primary Dressing Prisma 4.34 (in) Discharge Instruction: pack into tunnel and wound Secondary Dressing Gauze Discharge Instruction: moisten with Vashe and place over collagen (BORDER) Zetuvit Plus SILICONE BORDER Dressing 5x5 (in/in) Discharge Instruction: Please do not put silicone bordered dressings under wraps. Use non-bordered dressing only. Secured With Compression Wrap Compression Stockings Facilities manager) Signed: 10/23/2023 4:40:09 PM By: Midge Aver MSN RN CNS WTA Signed: 10/24/2023 2:31:23 PM By: Yevonne Pax RN Entered By: Midge Aver on 10/23/2023 16:40:08 -------------------------------------------------------------------------------- Wound Assessment Details Patient Name: Date of Service: [facility], [name] 10/23/2023 3:30 PM Medical Record Number: [id number] Patient Account Number: [id number] Date of Birth/Sex: Treating RN: 08/08/1941 (82 y.o. [name] Primary Care Druanne Bosques: Barbette Reichmann Other Clinician: Betha Loa Referring Galaxy Borden: Treating Jerame Hedding/Extender: Jearld Shines Weeks in Treatment: 84 Wound Status Wound Number: 18 Primary Pressure Ulcer Etiology: Wound Location: Right Metatarsal head first Wound Open Wounding Event: Pressure Injury Status: Date Acquired: 07/31/2023 Comorbid Cataracts, Lymphedema, Hypertension, Peripheral Arterial Disease, Weeks Of Treatment: 12 History: Peripheral Venous Disease, Type II Diabetes, Osteoarthritis, Clustered Wound: No Neuropathy Photos Frogge, Jackilyn L ([id number]) [id number] Page 16 of 20 Wound  Measurements Length: (cm) 0.7 Width: (cm) 0.7 Depth: (cm) 0.1 Area: (cm) 0.385 Volume: (cm) 0.038 % Reduction in Area: 48.4% % Reduction in Volume: 49.3% Epithelialization: None Wound Description Classification: Category/Stage II Exudate Amount: Medium Exudate Type: Serosanguineous Exudate Color: red, brown Foul Odor After Cleansing: No Slough/Fibrino Yes Wound Bed Granulation Amount: Medium (34-66%) Exposed Structure Granulation Quality: Red Fascia Exposed: No Necrotic Amount: Medium (34-66%) Fat Layer (Subcutaneous Tissue) Exposed: Yes Necrotic Quality: Eschar, Adherent Slough Tendon Exposed: No Muscle  Exposed: No Joint Exposed: No Bone Exposed: No Treatment Notes Wound #18 (Metatarsal head first) Wound Laterality: Right Cleanser Vashe 5.8 (oz) Discharge Instruction: damp to dry daily Peri-Wound Care Topical Primary Dressing Xeroform 5x9-HBD (in/in) Discharge Instruction: Apply Xeroform 5x9-HBD (in/in) as directed Secondary Dressing ABD Pad 5x9 (in/in) Discharge Instruction: Cover with ABD pad Kerlix 4.5 x 4.1 (in/yd) Discharge Instruction: Apply Kerlix 4.5 x 4.1 (in/yd) as instructed Secured With ACE WRAP - 65M ACE Elastic Bandage With VELCRO Brand Closure, 4 (in) Compression Wrap Compression Stockings Add-Ons Electronic Signature(s) Signed: 10/23/2023 4:40:35 PM By: Midge Aver MSN RN CNS WTA Signed: 10/24/2023 2:31:23 PM By: Yevonne Pax RN Entered By: Midge Aver on 10/23/2023 16:40:34 Scaff, [name] ([id number]) [id number] Page 17 of 20 -------------------------------------------------------------------------------- Wound Assessment Details Patient Name: Date of Service: [facility], [name] 10/23/2023 3:30 PM Medical Record Number: [id number] Patient Account Number: [id number] Date of Birth/Sex: Treating RN: [birth year] (82 y.o. [name] Primary Care Eriel Doyon: Barbette Reichmann Other Clinician: Betha Loa Referring Stanton Kissoon: Treating Aubreigh Fuerte/Extender: Jearld Shines Weeks in Treatment: 37 Wound Status Wound Number: 19 Primary Venous Leg Ulcer Etiology: Wound Location: Right, Anterior Lower Leg Wound Open Wounding Event: Blister Status: Date Acquired: 09/17/2023 Comorbid Cataracts, Lymphedema, Hypertension, Peripheral Arterial Disease, Weeks Of Treatment: 5 History: Peripheral Venous Disease, Type II Diabetes, Osteoarthritis, Clustered Wound: Yes Neuropathy Photos Wound Measurements Length: (cm) 4 Width: (cm) 8.2 Depth: (cm) 0.1 Area: (cm) 25.761 Volume: (cm) 2.576 % Reduction in Area: -264.4% % Reduction in Volume: -264.4% Epithelialization: None Wound Description Classification: Full Thickness Without Exposed Support Exudate Amount: Medium Exudate Type: Serosanguineous Exudate Color: red, brown Structures Foul Odor After Cleansing: No Slough/Fibrino Yes Wound Bed Exposed Structure Fascia Exposed: No Fat Layer (Subcutaneous Tissue) Exposed: Yes Tendon Exposed: No Muscle Exposed: No Joint Exposed: No Bone Exposed: No Treatment Notes Wound #19 (Lower Leg) Wound Laterality: Right, Anterior Cleanser Vashe 5.8 (oz) Discharge Instruction: damp to dry daily Lamadrid, Carra L ([id number]) [id number] Page 18 of 20 Peri-Wound Care Topical Primary Dressing Xeroform 5x9-HBD (in/in) Discharge Instruction: Apply Xeroform 5x9-HBD (in/in) as directed Secondary Dressing ABD Pad 5x9 (in/in) Discharge Instruction: Cover with ABD pad Kerlix 4.5 x 4.1 (in/yd) Discharge Instruction: Apply Kerlix 4.5 x 4.1 (in/yd) as instructed Secured With ACE WRAP - 65M ACE Elastic Bandage With VELCRO Brand Closure, 4 (in) Compression Wrap Compression Stockings Add-Ons Electronic Signature(s) Signed: 10/23/2023 4:41:19 PM By: Midge Aver MSN RN CNS WTA Signed: 10/24/2023 2:31:23 PM By: Yevonne Pax RN Entered By: Midge Aver on 10/23/2023  16:41:18 -------------------------------------------------------------------------------- Wound Assessment Details Patient Name: Date of Service: [facility], [name] 10/23/2023 3:30 PM Medical Record Number: [id number] Patient Account Number: [id number] Date of Birth/Sex: Treating RN: [birth year] (82 y.o. [name] Primary Care Raynell Upton: Barbette Reichmann Other Clinician: Betha Loa Referring Arionna Hoggard: Treating Johngabriel Verde/Extender: Jearld Shines Weeks in Treatment: 66 Wound Status Wound Number: 6 Primary Pressure Ulcer Etiology: Wound Location: Right Calcaneus Wound Open Wounding Event: Pressure Injury Status: Date Acquired: 07/23/2022 Comorbid Cataracts, Lymphedema, Hypertension, Peripheral Arterial Disease, Weeks Of Treatment: 56 History: Peripheral Venous Disease, Type II Diabetes, Osteoarthritis, Clustered Wound: No Neuropathy Photos Wound Measurements Length: (cm) 6 Width: (cm) 5.2 [place]fax, Annalyse L ([id number]) Depth: (cm) 0.1 Area: (cm) 24.504 Volume: (cm) 2.45 % Reduction in Area: -160% % Reduction in Volume: 13.3% [id number] Page 19 of 20 Epithelialization: None Wound Description Classification: Category/Stage III Wound Margin: Distinct, outline attached Exudate Amount: Medium Exudate Type: Serosanguineous Exudate Color: red, brown Foul Odor  After Cleansing: No Slough/Fibrino Yes Wound Bed Granulation Amount: Medium (34-66%) Exposed Structure Granulation Quality: Red, Pink Fascia Exposed: No Necrotic Amount: Medium (34-66%) Fat Layer (Subcutaneous Tissue) Exposed: Yes Necrotic Quality: Eschar, Adherent Slough Tendon Exposed: No Muscle Exposed: No Joint Exposed: No Bone Exposed: No Treatment Notes Wound #6 (Calcaneus) Wound Laterality: Right Cleanser Vashe 5.8 (oz) Discharge Instruction: damp to dry daily Peri-Wound Care Topical Primary Dressing Xeroform 5x9-HBD (in/in) Discharge Instruction:  Apply Xeroform 5x9-HBD (in/in) as directed Secondary Dressing ABD Pad 5x9 (in/in) Discharge Instruction: Cover with ABD pad Kerlix 4.5 x 4.1 (in/yd) Discharge Instruction: Apply Kerlix 4.5 x 4.1 (in/yd) as instructed Secured With ACE WRAP - 18M ACE Elastic Bandage With VELCRO Brand Closure, 4 (in) Compression Wrap Compression Stockings Add-Ons Electronic Signature(s) Signed: 10/23/2023 4:41:44 PM By: Midge Aver MSN RN CNS WTA Signed: 10/24/2023 2:31:23 PM By: Yevonne Pax RN Entered By: Midge Aver on 10/23/2023 16:41:44 -------------------------------------------------------------------------------- Vitals Details Patient Name: Date of Service: [facility], [name] 10/23/2023 3:30 PM Medical Record Number: [id number] Patient Account Number: [id number] Date of Birth/Sex: Treating RN: October 15, 1941 (82 y.o. [name] Primary Care Enio Hornback: Barbette Reichmann Other Clinician: Sibel, Stachowicz ([id number]) [id number] Page 20 of 20 Referring Payslie Mccaig: Treating Kamuela Magos/Extender: Jearld Shines Weeks in Treatment: 36 Vital Signs Time Taken: 16:08 Temperature (F): 98.0 Weight (lbs): 98 Pulse (bpm): 93 Respiratory Rate (breaths/min): 18 Blood Pressure (mmHg): 106/60 Reference Range: 80 - 120 mg / dl Electronic Signature(s) Signed: 10/23/2023 5:40:51 PM By: Betha Loa Entered By: Betha Loa on 10/23/2023 16:11:00

## 2023-10-28 DIAGNOSIS — L89214 Pressure ulcer of right hip, stage 4: Secondary | ICD-10-CM | POA: Diagnosis not present

## 2023-10-28 DIAGNOSIS — I2510 Atherosclerotic heart disease of native coronary artery without angina pectoris: Secondary | ICD-10-CM | POA: Diagnosis not present

## 2023-10-28 DIAGNOSIS — L89220 Pressure ulcer of left hip, unstageable: Secondary | ICD-10-CM | POA: Diagnosis not present

## 2023-10-28 DIAGNOSIS — E119 Type 2 diabetes mellitus without complications: Secondary | ICD-10-CM | POA: Diagnosis not present

## 2023-10-28 DIAGNOSIS — L89623 Pressure ulcer of left heel, stage 3: Secondary | ICD-10-CM | POA: Diagnosis not present

## 2023-10-28 DIAGNOSIS — L89613 Pressure ulcer of right heel, stage 3: Secondary | ICD-10-CM | POA: Diagnosis not present

## 2023-10-28 DIAGNOSIS — L988 Other specified disorders of the skin and subcutaneous tissue: Secondary | ICD-10-CM | POA: Diagnosis not present

## 2023-10-29 ENCOUNTER — Other Ambulatory Visit
Admission: RE | Admit: 2023-10-29 | Discharge: 2023-10-29 | Disposition: A | Discharge status: Home / Self Care | Payer: PPO | Source: Ambulatory Visit | Attending: Infectious Diseases | Admitting: Infectious Diseases

## 2023-10-29 ENCOUNTER — Ambulatory Visit: Payer: PPO | Attending: Infectious Diseases | Admitting: Infectious Diseases

## 2023-10-29 VITALS — BP 95/64 | HR 94 | Temp 98.0°F

## 2023-10-29 DIAGNOSIS — I509 Heart failure, unspecified: Secondary | ICD-10-CM | POA: Diagnosis not present

## 2023-10-29 DIAGNOSIS — L89214 Pressure ulcer of right hip, stage 4: Secondary | ICD-10-CM | POA: Diagnosis not present

## 2023-10-29 DIAGNOSIS — L89629 Pressure ulcer of left heel, unspecified stage: Secondary | ICD-10-CM | POA: Insufficient documentation

## 2023-10-29 DIAGNOSIS — M6284 Sarcopenia: Secondary | ICD-10-CM | POA: Insufficient documentation

## 2023-10-29 DIAGNOSIS — E46 Unspecified protein-calorie malnutrition: Secondary | ICD-10-CM

## 2023-10-29 DIAGNOSIS — L988 Other specified disorders of the skin and subcutaneous tissue: Secondary | ICD-10-CM | POA: Diagnosis not present

## 2023-10-29 DIAGNOSIS — L89220 Pressure ulcer of left hip, unstageable: Secondary | ICD-10-CM | POA: Diagnosis not present

## 2023-10-29 DIAGNOSIS — R770 Abnormality of albumin: Secondary | ICD-10-CM

## 2023-10-29 DIAGNOSIS — Z7984 Long term (current) use of oral hypoglycemic drugs: Secondary | ICD-10-CM | POA: Insufficient documentation

## 2023-10-29 DIAGNOSIS — L089 Local infection of the skin and subcutaneous tissue, unspecified: Secondary | ICD-10-CM | POA: Diagnosis not present

## 2023-10-29 DIAGNOSIS — T148XXA Other injury of unspecified body region, initial encounter: Secondary | ICD-10-CM | POA: Insufficient documentation

## 2023-10-29 DIAGNOSIS — E039 Hypothyroidism, unspecified: Secondary | ICD-10-CM | POA: Diagnosis not present

## 2023-10-29 DIAGNOSIS — Z9011 Acquired absence of right breast and nipple: Secondary | ICD-10-CM | POA: Insufficient documentation

## 2023-10-29 DIAGNOSIS — Z981 Arthrodesis status: Secondary | ICD-10-CM | POA: Diagnosis not present

## 2023-10-29 DIAGNOSIS — I2510 Atherosclerotic heart disease of native coronary artery without angina pectoris: Secondary | ICD-10-CM | POA: Diagnosis not present

## 2023-10-29 DIAGNOSIS — L89899 Pressure ulcer of other site, unspecified stage: Secondary | ICD-10-CM | POA: Diagnosis not present

## 2023-10-29 DIAGNOSIS — D649 Anemia, unspecified: Secondary | ICD-10-CM | POA: Insufficient documentation

## 2023-10-29 DIAGNOSIS — I110 Hypertensive heart disease with heart failure: Secondary | ICD-10-CM | POA: Insufficient documentation

## 2023-10-29 DIAGNOSIS — L89619 Pressure ulcer of right heel, unspecified stage: Secondary | ICD-10-CM | POA: Insufficient documentation

## 2023-10-29 DIAGNOSIS — E119 Type 2 diabetes mellitus without complications: Secondary | ICD-10-CM | POA: Diagnosis not present

## 2023-10-29 DIAGNOSIS — Z853 Personal history of malignant neoplasm of breast: Secondary | ICD-10-CM | POA: Diagnosis not present

## 2023-10-29 DIAGNOSIS — E1151 Type 2 diabetes mellitus with diabetic peripheral angiopathy without gangrene: Secondary | ICD-10-CM | POA: Insufficient documentation

## 2023-10-29 DIAGNOSIS — L89219 Pressure ulcer of right hip, unspecified stage: Secondary | ICD-10-CM | POA: Insufficient documentation

## 2023-10-29 DIAGNOSIS — L89613 Pressure ulcer of right heel, stage 3: Secondary | ICD-10-CM | POA: Diagnosis not present

## 2023-10-29 DIAGNOSIS — L89623 Pressure ulcer of left heel, stage 3: Secondary | ICD-10-CM | POA: Diagnosis not present

## 2023-10-29 NOTE — Progress Notes (Signed)
NAME: [name]  DOB: 1941/07/31  MRN: [id number]  Date/Time: 10/29/2023 10:48 AM   Subjective:   Pt is here with her son for follow up of multiple pressure wounds.  Last seen sept 2024 When the wounds were so much better and almost healed- Son says he did not take her to the wound clinic for a few weeks during thansks giving period and they got bad especially the rt heel wound and left leg wounds  No feve ror chills  She still is frail and sarcopenic  Following taken from the last visit note       [name] is a 82 y.o. 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  She was hospitalized again in July 7/10-7/19/24 with multiple wounds . MRI pelvis showed wound on the rt greater trochanter area with osteomyelitis MRI of the Rt foot showed Acute osteomyelitis of the posterior calcaneal body.  MRSA and pseudomonas in culture and she received IV antibiotic for 10 days followed by linezolid and cipro for 5 weeks. completed 06/27/23. Was doing much better after that     Past Medical History:  Diagnosis Date   Arthritis    Arthritis    Breast cancer (HCC) 1992   right breast with lumpectomy and rad tx   Cancer of right breast (HCC) 04/16/2013   right breast with mastectomy   Diabetes mellitus without complication (HCC)    Gout    High cholesterol    Hyperlipidemia    Hypertension    Personal history of radiation therapy     Past Surgical History:  Procedure Laterality Date   ABDOMINAL HYSTERECTOMY     ANKLE FRACTURE SURGERY Right 2004   BREAST  LUMPECTOMY Right 1992   positive   IRRIGATION AND DEBRIDEMENT FOOT Right 11/02/2022   Procedure: IRRIGATION AND DEBRIDEMENT RIGHT FOOT AND BONE BIOPSY RIGHT FOOT;  Surgeon: Gwyneth Revels, DPM;  Location: [facility] ORS;  Service: Podiatry;  Laterality: Right;   IVC FILTER INSERTION N/A 11/06/2022   Procedure: IVC FILTER INSERTION;  Surgeon: Annice Needy, MD;  Location: [facility] INVASIVE CV LAB;  Service: Cardiovascular;  Laterality: N/A;   MASTECTOMY Right 2014   PERIPHERAL VASCULAR THROMBECTOMY Left 11/06/2022   Procedure: PERIPHERAL VASCULAR THROMBECTOMY- EXTERNAL Iliac Vein/CFV;  Surgeon: Annice Needy, MD;  Location: [facility] INVASIVE CV LAB;  Service: Cardiovascular;  Laterality: Left;   SHOULDER SURGERY Right 2010    Social History   Socioeconomic History   Marital status: Married    Spouse name: Not on file   Number of children: 2   Years of education: Not on file   Highest education level: Not on file  Occupational History   Not on file  Tobacco Use   Smoking status: Never    Passive exposure: Never   Smokeless tobacco: Never  Vaping Use   Vaping status: Never Used  Substance and Sexual Activity   Alcohol use: No    Alcohol/week: 0.0 standard drinks of alcohol   Drug use: No   Sexual activity: Not Currently  Other Topics Concern   Not on file  Social History Narrative   Not on file   Social Drivers of Health   Financial Resource Strain: Low Risk  (09/04/2023)   Received from [facility]   Overall Financial Resource Strain (CARDIA)    Difficulty of Paying Living Expenses: Not hard at all  Food Insecurity: No Food Insecurity (09/04/2023)   Received from [facility]   Hunger Vital Sign    Worried About Running Out of Food in the Last Year: Never true    Ran Out of Food in the Last Year: Never true  Transportation Needs: No Transportation Needs (09/04/2023)   Received from [facility]    In the  past 12 months, has lack of transportation kept you from medical appointments or from getting medications?: No    Lack of Transportation (Non-Medical): No  Physical Activity: Not on file  Stress: Not on file  Social Connections: Not on file  Intimate Partner Violence: Not At Risk (05/22/2023)   Humiliation, Afraid, Rape, and Kick questionnaire    Fear of Current or Ex-Partner: No    Emotionally Abused: No    Physically Abused: No    Sexually Abused: No    Family History  Problem Relation Age of Onset   Diabetes Father    Breast cancer Neg Hx    Allergies  Allergen Reactions   Other Anaphylaxis    Anesthisia has been a health issue for her in the past.    Sulfa Antibiotics Rash   I? Current Outpatient Medications  Medication Sig Dispense Refill   acetaminophen (TYLENOL) 500 MG tablet Take 500-1,000 mg by mouth 4 (four) times daily as needed for mild pain or moderate pain.     allopurinol (ZYLOPRIM) 300 MG tablet Take 300 mg by mouth daily.  11   ascorbic acid (VITAMIN C) 500 MG tablet Take 1 tablet (500 mg total) by mouth 2 (two) times daily. 30 tablet 0   aspirin EC 81 MG tablet Take 1 tablet (81 mg total) by mouth daily. Swallow whole. 30 tablet 12   atorvastatin (LIPITOR) 40 MG tablet Take 40 mg by mouth daily.      diphenoxylate-atropine (LOMOTIL) 2.5-0.025 MG tablet Take 1-2 tablets by mouth 3 (three) times daily as needed.     ferrous sulfate 325 (65 FE) MG tablet Take 1 tablet (325 mg total) by mouth 2 (two) times daily with a meal. 60 tablet 3   gabapentin (NEURONTIN) 100 MG capsule TAKE 1 CAPSULE BY MOUTH ONCE DAILY FOR 7 DAYS THEN TAKE 1 CAPSULES BY MOUTH TWICE DAILY     gentamicin cream (GARAMYCIN) 0.1 % Apply 1 Application topically in the morning and at bedtime.     HYDROcodone-acetaminophen (NORCO) 7.5-325 MG tablet Take 1 tablet by mouth 2 (two) times daily as needed for moderate pain or severe pain.     levothyroxine (SYNTHROID) 88 MCG tablet Take 88 mcg by mouth  daily.     metFORMIN (GLUCOPHAGE) 1000 MG tablet Take 1,000 mg by mouth in the morning and at bedtime.     Multiple Vitamin (MULTIVITAMIN WITH MINERALS) TABS tablet Take 1 tablet by mouth daily.     polyethylene glycol (MIRALAX / GLYCOLAX) 17 g packet Take 17 g by mouth daily. 14 each 0   sitaGLIPtin (JANUVIA) 50 MG tablet Take 50 mg by mouth daily.     thiamine (VITAMIN B-1) 100 MG tablet Take 1 tablet (100 mg total) by mouth daily. 30 tablet  0   Vaginal Moisturizer (REPLENS EXTERNAL COMFORT) GEL Apply 1 application  topically daily. 42.5 g 11   vitamin B-12 (CYANOCOBALAMIN) 1000 MCG tablet Take 1,000 mcg by mouth daily.     zinc sulfate 220 (50 Zn) MG capsule Take 1 capsule (220 mg total) by mouth daily. 30 capsule 0   atenolol (TENORMIN) 50 MG tablet Take 50 mg by mouth daily. (Patient not taking: Reported on 10/29/2023)     No current facility-administered medications for this visit.     Abtx:  Anti-infectives (From admission, onward)    None       REVIEW OF SYSTEMS:  Const: negative fever, negative chills, weight loss  Eyes: negative diplopia or visual changes, negative eye pain ENT: negative coryza, negative sore throat Resp: negative cough, hemoptysis, dyspnea Cards: negative for chest pain, palpitations, lower extremity edema GU: Has Foley for urinary retention followed by urology with monthly Foley changes GI: Negative for abdominal pain, diarrhea, bleeding, constipation Skin: Multiple wounds Heme: negative for easy bruising and gum/nose bleeding QV:[id number] weakness Neurolo:dizziness  Psych: negative for feelings of anxiety, depression  Endocrine: diabetes Allergy/Immunology- sulfa Objective:  VITALS:  BP 95/64   Pulse 94   Temp 98 F (36.7 C) (Oral)   PHYSICAL EXAM:  General: Alert, cooperative, no distress, frail, pale, in wheelchair Head: Normocephalic, without obvious abnormality, atraumatic. Eyes: Conjunctivae clear, anicteric sclerae. Pupils are  equal ENT Nares normal. No drainage or sinus tenderness. Lips, mucosa, and tongue normal. No Thrush Edentulous Neck:, symmetrical, no adenopathy, thyroid: non tender no carotid bruit and no JVD.  Oct 29 2023 Rt hip  Rt heel    Left heel   Left leg  Left leg      This was sept 2024          Lungs: Clear to auscultation bilaterally. No Wheezing or Rhonchi. No rales. Heart: Regular rate and rhythm, no murmur, rub or gallop. Abdomen: did not examine as in wheel chair Foley in place Lymph: Cervical, supraclavicular normal. Neurologic: contracture lower extremities rt>left   ? Impression/Recommendation ? Multiple pressure ulcers of the  , right hip at the greater trochanter area, bilateral heels - was doing so much better for three motnhs after nearly 5 weeks of antibiotics ( cipro and linezolid ) in Aug-  Now worse again Rt heel ulcer is worse with eschar- she has PAD She needs to off load with prevalon boot all the time Will send culture from the rt heel and left leg and once I have the result can start antibiotic She is stable with no systemic symptoms  Wonder whetehr hyperbaric oxygen would help- will check with Wound clinic   Anemia improving, Hb last was 10  Urinary retention has Foley followed by urology  Malnutrition/hypoalbuminemia explained to patient that she needs to improve her high nutrition for the wounds to heal- appetite improved-  H/o ca breast- rt mastectomy  H/o 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 ___________________________________________________ Discussed with patient, and her son Will contact them with results  Note:  This document was prepared using Dragon voice recognition software and may include unintentional dictation errors.

## 2023-10-29 NOTE — Patient Instructions (Signed)
During today's visit, we discussed the slow healing of your chronic wounds and your restless leg syndrome. We also reviewed your recent lab results regarding anemia.  YOUR PLAN:  -CHRONIC WOUNDS: Chronic wounds are long-lasting sores that are slow to heal. Your wounds on the right heel, left shin, and both hips are improving but still need attention. We took cultures to check for bacteria and recommend you continue wound care at home with Xeriform and gauze. Please use offloading boots consistently to reduce pressure on the wounds. We may also discuss hyperbaric oxygen therapy with the wound clinic.  -RESTLESS LEG SYNDROME: Restless leg syndrome is a condition that causes an uncontrollable urge to move your legs, usually due to discomfort. Your symptoms have improved with Gabapentin, so we will continue with the current management.  -ANEMIA: Anemia is a condition where you don't have enough healthy red blood cells to carry adequate oxygen to your body's tissues. Your last labs in October showed anemia, but your kidney and liver function were normal. No repeat labs are needed at this time.  INSTRUCTIONS:  Please follow up with the wound clinic on 11/07/2023 for your next appointment.

## 2023-10-31 ENCOUNTER — Ambulatory Visit: Payer: PPO | Admitting: Physician Assistant

## 2023-10-31 LAB — AEROBIC CULTURE W GRAM STAIN (SUPERFICIAL SPECIMEN): Gram Stain: NONE SEEN

## 2023-11-01 ENCOUNTER — Ambulatory Visit: Payer: PPO | Admitting: Physician Assistant

## 2023-11-01 DIAGNOSIS — R339 Retention of urine, unspecified: Secondary | ICD-10-CM | POA: Diagnosis not present

## 2023-11-01 DIAGNOSIS — Z466 Encounter for fitting and adjustment of urinary device: Secondary | ICD-10-CM

## 2023-11-01 NOTE — Progress Notes (Signed)
Cath Change/ Replacement  Patient is present today for a catheter change due to urinary retention.  19ml of water was removed from the balloon, a 16FR foley cath was removed without difficulty.  Patient was cleaned and prepped in a sterile fashion with betadine. A 16 FR foley cath was replaced into the bladder, no complications were noted. Urine return was noted 20ml and urine was yellow in color. The balloon was filled with 20ml of sterile water. A night bag was attached for drainage.  A leg bag was also given to the patient and patient was given instruction on how to change from one bag to another. Patient tolerated well.    Performed by: Carman Ching, PA-C and Humberta Magallon-Mariche, CMA  Follow up: Return in about 4 weeks (around 11/29/2023) for Catheter exchange.

## 2023-11-07 ENCOUNTER — Other Ambulatory Visit: Payer: Self-pay | Admitting: Infectious Diseases

## 2023-11-07 ENCOUNTER — Telehealth: Payer: Self-pay

## 2023-11-07 ENCOUNTER — Encounter: Payer: PPO | Admitting: Internal Medicine

## 2023-11-07 DIAGNOSIS — L89623 Pressure ulcer of left heel, stage 3: Secondary | ICD-10-CM | POA: Diagnosis not present

## 2023-11-07 DIAGNOSIS — L89613 Pressure ulcer of right heel, stage 3: Secondary | ICD-10-CM | POA: Diagnosis not present

## 2023-11-07 DIAGNOSIS — E11622 Type 2 diabetes mellitus with other skin ulcer: Secondary | ICD-10-CM | POA: Diagnosis not present

## 2023-11-07 DIAGNOSIS — L89220 Pressure ulcer of left hip, unstageable: Secondary | ICD-10-CM | POA: Diagnosis not present

## 2023-11-07 DIAGNOSIS — L89890 Pressure ulcer of other site, unstageable: Secondary | ICD-10-CM | POA: Diagnosis not present

## 2023-11-07 DIAGNOSIS — L89214 Pressure ulcer of right hip, stage 4: Secondary | ICD-10-CM | POA: Diagnosis not present

## 2023-11-07 DIAGNOSIS — L89894 Pressure ulcer of other site, stage 4: Secondary | ICD-10-CM | POA: Diagnosis not present

## 2023-11-07 MED ORDER — LINEZOLID 600 MG PO TABS: 600.0000 mg | ORAL_TABLET | Freq: Two times a day (BID) | ORAL | 0 refills | Status: AC

## 2023-11-07 NOTE — Progress Notes (Signed)
Pt has MRSA in her wounds- sent linezolid 600mg  Po BID to [facility] for 2 weeks

## 2023-11-07 NOTE — Telephone Encounter (Signed)
Per Dr. Rivka Safer  Pt has MRSA in her wounds and I sent linezolid 600mg  PO BID for 2 weeks to [facility]. Can you ask her to start that. She has taken it before- just remind her not to take red wine, fermented cheese, vinegar products and coffee ( except 1 cup) . If she can hold off on oxycodone that would help.   Patient's son advised of results and medications has been sent.  I have also advised the patient's son of instructions for the medication. Vernadine Coombs Jonathon Resides, CMA

## 2023-11-07 NOTE — Progress Notes (Signed)
SHONI, KOLSTAD ([id number]) [id number] Page 1 of 21 Visit Report for 11/07/2023 Arrival Information Details Patient Name: Date of Service: [facility] [place] [name]. 11/07/2023 3:30 PM Medical Record Number: [id number] Patient Account Number: [id number] Date of Birth/Sex: Treating RN: 1941-04-20 (82 y.o. [name] Primary Care Rhett Mutschler: Barbette Reichmann Other Clinician: Referring Aaren Atallah: Treating Ngoc Detjen/Extender: [name], [name], Vishwanath Weeks in Treatment: 58 Visit Information History Since Last Visit Added or deleted any medications: No Patient Arrived: Wheel Chair Any new allergies or adverse reactions: No Arrival Time: 15:29 Had a fall or experienced change in No Accompanied By: husband activities of daily living that may affect Transfer Assistance: EasyPivot Patient Lift risk of falls: Patient Identification Verified: Yes Hospitalized since last visit: No Secondary Verification Process Completed: Yes Has Dressing in Place as Prescribed: Yes Patient Requires Transmission-Based Precautions: No Pain Present Now: No Patient Has Alerts: Yes Patient Alerts: DM II ABI R 1.30 TBI 1.12 ABI [name] 1.27 TBI 1.21 Electronic Signature(s) Signed: 11/07/2023 4:44:21 PM By: Midge Aver MSN RN CNS WTA Entered By: Midge Aver on 11/07/2023 12:29:55 -------------------------------------------------------------------------------- Clinic Level of Care Assessment Details Patient Name: Date of Service: [facility], [name] 11/07/2023 3:30 PM Medical Record Number: [id number] Patient Account Number: [id number] Date of Birth/Sex: Treating RN: Jan 10, 1941 (82 y.o. [name] Primary Care Kenyonna Micek: Barbette Reichmann Other Clinician: Referring Shandee Jergens: Treating Rakeya Glab/Extender: [name], [name], Vishwanath Weeks in Treatment: 58 Clinic Level of Care Assessment Items TOOL 4 Quantity Score X- 1 0 Use when only an EandM is  performed on FOLLOW-UP visit ASSESSMENTS - Nursing Assessment / Reassessment X- 1 10 Reassessment of Co-morbidities (includes updates in patient status) X- 1 5 Reassessment of Adherence to Treatment Plan ASSESSMENTS - Wound and Skin A ssessment / Reassessment []  - 0 Simple Wound Assessment / Reassessment - one wound [name], [name] ([id number]) [id number] Page 2 of 21 X- 9 5 Complex Wound Assessment / Reassessment - multiple wounds []  - 0 Dermatologic / Skin Assessment (not related to wound area) ASSESSMENTS - Focused Assessment X- 1 5 Circumferential Edema Measurements - multi extremities []  - 0 Nutritional Assessment / Counseling / Intervention []  - 0 Lower Extremity Assessment (monofilament, tuning fork, pulses) []  - 0 Peripheral Arterial Disease Assessment (using hand held doppler) ASSESSMENTS - Ostomy and/or Continence Assessment and Care []  - 0 Incontinence Assessment and Management []  - 0 Ostomy Care Assessment and Management (repouching, etc.) PROCESS - Coordination of Care []  - 0 Simple Patient / Family Education for ongoing care X- 1 20 Complex (extensive) Patient / Family Education for ongoing care X- 1 10 Staff obtains Chiropractor, Records, T Results / Process Orders est []  - 0 Staff telephones HHA, Nursing Homes / Clarify orders / etc []  - 0 Routine Transfer to another Facility (non-emergent condition) []  - 0 Routine Hospital Admission (non-emergent condition) []  - 0 New Admissions / Manufacturing engineer / Ordering NPWT Apligraf, etc. , []  - 0 Emergency Hospital Admission (emergent condition) []  - 0 Simple Discharge Coordination X- 1 15 Complex (extensive) Discharge Coordination PROCESS - Special Needs []  - 0 Pediatric / Minor Patient Management []  - 0 Isolation Patient Management []  - 0 Hearing / Language / Visual special needs []  - 0 Assessment of Community assistance (transportation, D/C planning, etc.) []  -  0 Additional assistance / Altered mentation []  - 0 Support Surface(s) Assessment (bed, cushion, seat, etc.) INTERVENTIONS - Wound Cleansing / Measurement []  - 0 Simple Wound Cleansing -  one wound X- 9 5 Complex Wound Cleansing - multiple wounds X- 1 5 Wound Imaging (photographs - any number of wounds) []  - 0 Wound Tracing (instead of photographs) []  - 0 Simple Wound Measurement - one wound X- 9 5 Complex Wound Measurement - multiple wounds INTERVENTIONS - Wound Dressings X - Small Wound Dressing one or multiple wounds 9 10 []  - 0 Medium Wound Dressing one or multiple wounds []  - 0 Large Wound Dressing one or multiple wounds []  - 0 Application of Medications - topical []  - 0 Application of Medications - injection INTERVENTIONS - Miscellaneous []  - 0 External ear exam []  - 0 Specimen Collection (cultures, biopsies, blood, body fluids, etc.) [name], [name] ([id number]) [id number] Page 3 of 21 []  - 0 Specimen(s) / Culture(s) sent or taken to Lab for analysis []  - 0 Patient Transfer (multiple staff / Nurse, adult / Similar devices) []  - 0 Simple Staple / Suture removal (25 or less) []  - 0 Complex Staple / Suture removal (26 or more) []  - 0 Hypo / Hyperglycemic Management (close monitor of Blood Glucose) []  - 0 Ankle / Brachial Index (ABI) - do not check if billed separately X- 1 5 Vital Signs Has the patient been seen at the hospital within the last three years: Yes Total Score: 300 Level Of Care: New/Established - Level 5 Electronic Signature(s) Signed: 11/07/2023 4:44:21 PM By: Midge Aver MSN RN CNS WTA Entered By: Midge Aver on 11/07/2023 13:41:44 -------------------------------------------------------------------------------- Encounter Discharge Information Details Patient Name: Date of Service: [facility] [place], [name] 11/07/2023 3:30 PM Medical Record Number: [id number] Patient Account Number: [id number] Date of Birth/Sex:  Treating RN: [birth year] (82 y.o. [name] Primary Care Jhordyn Hoopingarner: Barbette Reichmann Other Clinician: Referring Daking Westervelt: Treating Keyairra Kolinski/Extender: [name], [name], Vishwanath Weeks in Treatment: 78 Encounter Discharge Information Items Discharge Condition: Unstable Ambulatory Status: Wheelchair Discharge Destination: Home Transportation: Private Auto Accompanied By: son/husband Schedule Follow-up Appointment: Yes Clinical Summary of Care: Electronic Signature(s) Signed: 11/07/2023 4:43:40 PM By: Midge Aver MSN RN CNS WTA Entered By: Midge Aver on 11/07/2023 13:43:40 -------------------------------------------------------------------------------- Lower Extremity Assessment Details Patient Name: Date of Service: [facility], [name] 11/07/2023 3:30 PM Medical Record Number: [id number] Patient Account Number: [id number] Date of Birth/Sex: Treating RN: 10-19-1941 (82 y.o. [name], [name], [name] ([id number]) [id number] Page 4 of 21 Primary Care Naudia Crosley: Barbette Reichmann Other Clinician: Referring Telly Jawad: Treating Ansley Stanwood/Extender: [name], [name], Vishwanath Weeks in Treatment: 58 Electronic Signature(s) Signed: 11/07/2023 4:38:38 PM By: Midge Aver MSN RN CNS WTA Entered By: Midge Aver on 11/07/2023 13:38:37 -------------------------------------------------------------------------------- Multi Wound Chart Details Patient Name: Date of Service: [facility], [name] 11/07/2023 3:30 PM Medical Record Number: [id number] Patient Account Number: [id number] Date of Birth/Sex: Treating RN: Jan 20, 1941 (82 y.o. [name] Primary Care Katelan Hirt: Barbette Reichmann Other Clinician: Referring Amani Marseille: Treating Kanav Kazmierczak/Extender: [name], [name], Vishwanath Weeks in Treatment: 58 Vital Signs Height(in): Pulse(bpm): 109 Weight(lbs): 98 Blood Pressure(mmHg): 112/67 Body Mass  Index(BMI): Temperature(F): 98.3 Respiratory Rate(breaths/min): 18 [11:Photos:] Right Trochanter Left Calcaneus Right, Distal, Medial Lower Leg Wound Location: Pressure Injury Pressure Injury Gradually Appeared Wounding Event: Pressure Ulcer Pressure Ulcer Diabetic Wound/Ulcer of the Lower Primary Etiology: Extremity Cataracts, Lymphedema, Cataracts, Lymphedema, Cataracts, Lymphedema, Comorbid History: Hypertension, Peripheral Arterial Hypertension, Peripheral Arterial Hypertension, Peripheral Arterial Disease, Peripheral Venous Disease, Disease, Peripheral Venous Disease, Disease, Peripheral Venous Disease, Type II Diabetes, Osteoarthritis, Type II Diabetes, Osteoarthritis, Type II Diabetes,  Osteoarthritis, Neuropathy Neuropathy Neuropathy 10/24/2022 11/28/2022 03/10/2023 Date Acquired: 53 48 34 Weeks of Treatment: Open Open Open Wound Status: No No No Wound Recurrence: No No Yes Clustered Wound: 1.2x2.2x0.7 3.2x2x0.1 7.5x4x0.1 Measurements [name] x W x D (cm) 2.073 5.027 23.562 A (cm) : rea 1.451 0.503 2.356 Volume (cm) : 0.00% -45.50% -328.60% % Reduction in A rea: -601.00% -45.40% -328.40% % Reduction in Volume: 12 Starting Position 1 (o'clock): 12 Ending Position 1 (o'clock): 1 Maximum Distance 1 (cm): N/A N/A N/A Tunneling: Yes N/A N/A Undermining: Category/Stage IV Category/Stage III Grade 2 Classification: Medium Medium Large Exudate A mount: Serosanguineous Serosanguineous Serosanguineous Exudate Type: red, brown red, brown red, brown Exudate Color: [name], [name] ([id number]) [id number] Page 5 of 21 Well defined, not attached Flat and Intact N/A Wound Margin: Large (67-100%) Medium (34-66%) Large (67-100%) Granulation Amount: Red Pink Red Granulation Quality: None Present (0%) Medium (34-66%) Small (1-33%) Necrotic Amount: N/A Eschar, Adherent Slough Adherent Slough Necrotic Tissue: Fascia: Yes Fat Layer  (Subcutaneous Tissue): Yes Fat Layer (Subcutaneous Tissue): Yes Exposed Structures: Fat Layer (Subcutaneous Tissue): Yes Fascia: No Fascia: No Tendon: Yes Tendon: No Tendon: No Muscle: Yes Muscle: No Muscle: No Bone: Yes Joint: No Joint: No Joint: No Bone: No Bone: No None None None Epithelialization: Wound Number: [id number]  Photos: Left, Anterior Lower Leg Left Trochanter Right Metatarsal head first Wound Location: Pressure Injury Pressure Injury Pressure Injury Wounding Event: Pressure Ulcer Pressure Ulcer Pressure Ulcer Primary Etiology: Cataracts, Lymphedema, Cataracts, Lymphedema, Cataracts, Lymphedema, Comorbid History: Hypertension, Peripheral Arterial Hypertension, Peripheral Arterial Hypertension, Peripheral Arterial Disease, Peripheral Venous Disease, Disease, Peripheral Venous Disease, Disease, Peripheral Venous Disease, Type II Diabetes, Osteoarthritis, Type II Diabetes, Osteoarthritis, Type II Diabetes, Osteoarthritis, Neuropathy Neuropathy Neuropathy 04/11/2023 05/22/2023 07/31/2023 Date Acquired: 29 24 14  Weeks of Treatment: Open Open Open Wound Status: No No No Wound Recurrence: Yes No No Clustered Wound: 8.8x12x0.1 1.2x1x0.8 1x1x0.1 Measurements [name] x W x D (cm) 82.938 0.942 0.785 A (cm) : rea 8.294 0.754 0.079 Volume (cm) : -269.70% 71.00% -5.20% % Reduction in A rea: -269.80% -132.00% -5.30% % Reduction in Volume: 6 Position 1 (o'clock): 1.6 Maximum Distance 1 (cm): N/A Yes N/A Tunneling: N/A N/A N/A Undermining: Category/Stage IV Unstageable/Unclassified Category/Stage II Classification: Medium Medium Medium Exudate A mount: Serosanguineous Serosanguineous Serosanguineous Exudate Type: red, brown red, brown red, brown Exudate Color: N/A N/A N/A Wound Margin: None Present (0%) N/A Medium (34-66%) Granulation A mount: N/A N/A Red Granulation Quality: None Present (0%) N/A Medium (34-66%) Necrotic A mount: N/A Eschar, Adherent  Slough Eschar, Adherent Slough Necrotic Tissue: Fat Layer (Subcutaneous Tissue): Yes Fat Layer (Subcutaneous Tissue): Yes Fat Layer (Subcutaneous Tissue): Yes Exposed Structures: Tendon: Yes Fascia: No Fascia: No Fascia: No Tendon: No Tendon: No Muscle: No Muscle: No Muscle: No Joint: No Joint: No Joint: No Bone: No Bone: No Bone: No Large (67-100%) N/A None Epithelialization: Wound Number: [id number]  Photos: Right, Anterior Lower Leg Right Gluteal fold Right Calcaneus Wound Location: Blister Gradually Appeared Pressure Injury Wounding Event: Venous Leg Ulcer Pressure Ulcer Pressure Ulcer Primary Etiology: Cataracts, Lymphedema, Cataracts, Lymphedema, Cataracts, Lymphedema, Comorbid History: Hypertension, Peripheral Arterial Hypertension, Peripheral Arterial Hypertension, Peripheral Arterial Disease, Peripheral Venous Disease, Disease, Peripheral Venous Disease, Disease, Peripheral Venous Disease, Type II Diabetes, Osteoarthritis, Type II Diabetes, Osteoarthritis, Type II Diabetes, Osteoarthritis, Neuropathy Neuropathy Neuropathy 09/17/2023 10/13/2023 07/23/2022 Date Acquired: 7 0 58 Weeks of Treatment: Open Open Open Wound Status: HALSEY, NEAL [name] ([id number]) [id number] Page 6 of 21 No No No Wound Recurrence: Yes  No No Clustered Wound: 3x4.2x0.1 1x0.7x0.1 6x3x0.1 Measurements [name] x W x D (cm) 9.896 0.55 14.137 A (cm) : rea 0.99 0.055 1.414 Volume (cm) : -40.00% N/A -50.00% % Reduction in Area: -40.00% N/A 50.00% % Reduction in Volume: N/A N/A N/A Tunneling: N/A N/A N/A Undermining: Full Thickness Without Exposed Unstageable/Unclassified Category/Stage III Classification: Support Structures Medium Medium Medium Exudate A mount: Serosanguineous Serosanguineous Serosanguineous Exudate Type: red, brown red, brown red, brown Exudate Color: N/A N/A Distinct, outline attached Wound Margin: N/A None Present (0%) Medium  (34-66%) Granulation Amount: N/A N/A Red, Pink Granulation Quality: N/A Large (67-100%) Medium (34-66%) Necrotic Amount: N/A Adherent Slough Eschar, Adherent Slough Necrotic Tissue: Fat Layer (Subcutaneous Tissue): Yes Fat Layer (Subcutaneous Tissue): Yes Fat Layer (Subcutaneous Tissue): Yes Exposed Structures: Fascia: No Fascia: No Fascia: No Tendon: No Tendon: No Tendon: No Muscle: No Muscle: No Muscle: No Joint: No Joint: No Joint: No Bone: No Bone: No Bone: No None None None Epithelialization: Treatment Notes Electronic Signature(s) Signed: 11/07/2023 4:38:45 PM By: Midge Aver MSN RN CNS WTA Entered By: Midge Aver on 11/07/2023 13:38:45 -------------------------------------------------------------------------------- Multi-Disciplinary Care Plan Details Patient Name: Date of Service: [facility], [name] 11/07/2023 3:30 PM Medical Record Number: [id number] Patient Account Number: [id number] Date of Birth/Sex: Treating RN: 03-17-1941 (82 y.o. [name] Primary Care Laurella Tull: Barbette Reichmann Other Clinician: Referring Jacquan Savas: Treating Lilac Hoff/Extender: [name], [name], Vishwanath Weeks in Treatment: 21 Active Inactive Electronic Signature(s) Signed: 11/07/2023 4:42:18 PM By: Midge Aver MSN RN CNS WTA Entered By: Midge Aver on 11/07/2023 13:42:17 Foots, [name] ([id number]) [id number] Page 7 of 21 -------------------------------------------------------------------------------- Pain Assessment Details Patient Name: Date of Service: [facility], [name] 11/07/2023 3:30 PM Medical Record Number: [id number] Patient Account Number: [id number] Date of Birth/Sex: Treating RN: 1941/01/04 (82 y.o. [name] Primary Care Katherinne Mofield: Barbette Reichmann Other Clinician: Referring Khayree Delellis: Treating Demitrious Mccannon/Extender: [name], [name], Vishwanath Weeks in Treatment: 47 Active Problems Location of  Pain Severity and Description of Pain Patient Has Paino Yes Site Locations Rate the pain. Current Pain Level: 7 Pain Management and Medication Current Pain Management: Electronic Signature(s) Signed: 11/07/2023 4:44:21 PM By: Midge Aver MSN RN CNS WTA Entered By: Midge Aver on 11/07/2023 12:31:18 -------------------------------------------------------------------------------- Patient/Caregiver Education Details Patient Name: Date of Service: [facility], [name] 12/26/2024andnbsp3:30 PM Medical Record Number: [id number] Patient Account Number: [id number] Date of Birth/Gender: Treating RN: 01-Jan-1941 (82 y.o. [name] Primary Care Physician: Barbette Reichmann Other Clinician: Referring Physician: Treating Physician/Extender: [name], MICHA EL [place], Vishwanath Weeks in Treatment: [address], [place] [name] ([id number]) [id number] Page 8 of 21 Education Assessment Education Provided To: Patient Education Topics Provided Wound/Skin Impairment: Handouts: Caring for Your Ulcer Methods: Explain/Verbal Responses: State content correctly Electronic Signature(s) Signed: 11/07/2023 4:44:21 PM By: Midge Aver MSN RN CNS WTA Entered By: Midge Aver on 11/07/2023 13:42:32 -------------------------------------------------------------------------------- Wound Assessment Details Patient Name: Date of Service: [facility], [name] 11/07/2023 3:30 PM Medical Record Number: [id number] Patient Account Number: [id number] Date of Birth/Sex: Treating RN: 28-Jan-1941 (82 y.o. [name] Primary Care Caedin Mogan: Barbette Reichmann Other Clinician: Referring Helayne Metsker: Treating Daneen Volcy/Extender: [name], [name], Vishwanath Weeks in Treatment: 58 Wound Status Wound Number: 11 Primary Pressure Ulcer Etiology: Wound Location: Right Trochanter Wound Open Wounding Event: Pressure Injury Status: Date Acquired: 10/24/2022 Comorbid Cataracts,  Lymphedema, Hypertension, Peripheral Arterial Disease, Weeks Of Treatment: 53 History: Peripheral Venous Disease, Type II Diabetes, Osteoarthritis,  Clustered Wound: No Neuropathy Photos Wound Measurements Length: (cm) 1.2 Width: (cm) 2.2 Depth: (cm) 0.7 Area: (cm) 2.073 Volume: (cm) 1.451 % Reduction in Area: 0% % Reduction in Volume: -601% Epithelialization: None Undermining: Yes Starting Position (o'clock): 12 Ending Position (o'clock): 12 Maximum Distance: (cm) 1 Wound Description Denault, Tarae [name] ([id number]) Classification: Category/Stage IV Wound Margin: Well defined, not attached Exudate Amount: Medium Exudate Type: Serosanguineous Exudate Color: red, brown [id number] Page 9 of 21 Foul Odor After Cleansing: No Slough/Fibrino Yes Wound Bed Granulation Amount: Large (67-100%) Exposed Structure Granulation Quality: Red Fascia Exposed: Yes Necrotic Amount: None Present (0%) Fat Layer (Subcutaneous Tissue) Exposed: Yes Tendon Exposed: Yes Muscle Exposed: Yes Necrosis of Muscle: No Joint Exposed: No Bone Exposed: Yes Treatment Notes Wound #11 (Trochanter) Wound Laterality: Right Cleanser Vashe 5.8 (oz) Discharge Instruction: Use as directed for cleaning wound Peri-Wound Care Topical Primary Dressing Prisma 4.34 (in) Discharge Instruction: pack into tunnel and wound Secondary Dressing Gauze Discharge Instruction: moisten with Vashe and place over collagen (BORDER) Zetuvit Plus SILICONE BORDER Dressing 5x5 (in/in) Discharge Instruction: Please do not put silicone bordered dressings under wraps. Use non-bordered dressing only. Secured With Compression Wrap Compression Stockings Facilities manager) Signed: 11/07/2023 4:34:34 PM By: Midge Aver MSN RN CNS WTA Entered By: Midge Aver on 11/07/2023 13:34:33 -------------------------------------------------------------------------------- Wound Assessment  Details Patient Name: Date of Service: [facility], [name] 11/07/2023 3:30 PM Medical Record Number: [id number] Patient Account Number: [id number] Date of Birth/Sex: Treating RN: 1940-12-29 (82 y.o. [name] Primary Care Miller Edgington: Barbette Reichmann Other Clinician: Referring Caroljean Monsivais: Treating Analysa Nutting/Extender: [name], [name], Vishwanath Weeks in Treatment: 58 Wound Status Wound Number: 13 Primary Pressure Ulcer Etiology: Wound Location: Left Calcaneus [name], [name] ([id number]) [id number] Page 10 of 21 Wound Open Wounding Event: Pressure Injury Status: Date Acquired: 11/28/2022 Comorbid Cataracts, Lymphedema, Hypertension, Peripheral Arterial Disease, Weeks Of Treatment: 48 History: Peripheral Venous Disease, Type II Diabetes, Osteoarthritis, Clustered Wound: No Neuropathy Photos Wound Measurements Length: (cm) 3.2 Width: (cm) 2 Depth: (cm) 0.1 Area: (cm) 5.027 Volume: (cm) 0.503 % Reduction in Area: -45.5% % Reduction in Volume: -45.4% Epithelialization: None Wound Description Classification: Category/Stage III Wound Margin: Flat and Intact Exudate Amount: Medium Exudate Type: Serosanguineous Exudate Color: red, brown Foul Odor After Cleansing: No Slough/Fibrino Yes Wound Bed Granulation Amount: Medium (34-66%) Exposed Structure Granulation Quality: Pink Fascia Exposed: No Necrotic Amount: Medium (34-66%) Fat Layer (Subcutaneous Tissue) Exposed: Yes Necrotic Quality: Eschar, Adherent Slough Tendon Exposed: No Muscle Exposed: No Joint Exposed: No Bone Exposed: No Treatment Notes Wound #13 (Calcaneus) Wound Laterality: Left Cleanser Vashe 5.8 (oz) Discharge Instruction: damp to dry daily Peri-Wound Care Topical Betadine Discharge Instruction: Apply betadine to heel Primary Dressing Secondary Dressing ABD Pad 5x9 (in/in) Discharge Instruction: Cover with ABD pad Kerlix 4.5 x 4.1 (in/yd) Discharge  Instruction: Apply Kerlix 4.5 x 4.1 (in/yd) as instructed Secured With ACE WRAP - 65M ACE Elastic Bandage With VELCRO Brand Closure, 4 (in) Compression Wrap Compression Stockings Add-Ons Electronic Signature(s) [name], [name] ([id number]) [id number] Page 11 of 21 Signed: 11/07/2023 4:34:59 PM By: Midge Aver MSN RN CNS WTA Entered By: Midge Aver on 11/07/2023 13:34:59 -------------------------------------------------------------------------------- Wound Assessment Details Patient Name: Date of Service: [facility], [name] 11/07/2023 3:30 PM Medical Record Number: [id number] Patient Account Number: [id number] Date of Birth/Sex: Treating RN: 06-25-1941 (82 y.o. [name] Primary Care Sybel Standish: Barbette Reichmann Other Clinician: Referring Kyrillos Adams: Treating Juanetta Negash/Extender: [name], [name], Vishwanath Weeks in  Treatment: 58 Wound Status Wound Number: 14 Primary Diabetic Wound/Ulcer of the Lower Extremity Etiology: Wound Location: Right, Distal, Medial Lower Leg Wound Open Wounding Event: Gradually Appeared Status: Date Acquired: 03/10/2023 Comorbid Cataracts, Lymphedema, Hypertension, Peripheral Arterial Disease, Weeks Of Treatment: 34 History: Peripheral Venous Disease, Type II Diabetes, Osteoarthritis, Clustered Wound: Yes Neuropathy Photos Wound Measurements Length: (cm) 7.5 Width: (cm) 4 Depth: (cm) 0.1 Area: (cm) 23.562 Volume: (cm) 2.356 % Reduction in Area: -328.6% % Reduction in Volume: -328.4% Epithelialization: None Wound Description Classification: Grade 2 Exudate Amount: Large Exudate Type: Serosanguineous Exudate Color: red, brown Foul Odor After Cleansing: No Slough/Fibrino Yes Wound Bed Granulation Amount: Large (67-100%) Exposed Structure Granulation Quality: Red Fascia Exposed: No Necrotic Amount: Small (1-33%) Fat Layer (Subcutaneous Tissue) Exposed: Yes Necrotic Quality: Adherent  Slough Tendon Exposed: No Muscle Exposed: No Joint Exposed: No Bone Exposed: No Treatment Notes Wound #14 (Lower Leg) Wound Laterality: Right, Medial, Distal Knecht, Berdine [name] ([id number]) [id number] Page 12 of 21 Cleanser Vashe 5.8 (oz) Discharge Instruction: damp to dry daily Peri-Wound Care Topical Primary Dressing Xeroform 5x9-HBD (in/in) Discharge Instruction: Apply Xeroform 5x9-HBD (in/in) as directed Secondary Dressing ABD Pad 5x9 (in/in) Discharge Instruction: Cover with ABD pad Kerlix 4.5 x 4.1 (in/yd) Discharge Instruction: Apply Kerlix 4.5 x 4.1 (in/yd) as instructed Secured With ACE WRAP - 51M ACE Elastic Bandage With VELCRO Brand Closure, 4 (in) Compression Wrap Compression Stockings Add-Ons Electronic Signature(s) Signed: 11/07/2023 4:35:35 PM By: Midge Aver MSN RN CNS WTA Entered By: Midge Aver on 11/07/2023 13:35:35 -------------------------------------------------------------------------------- Wound Assessment Details Patient Name: Date of Service: [facility], [name] 11/07/2023 3:30 PM Medical Record Number: [id number] Patient Account Number: [id number] Date of Birth/Sex: Treating RN: 07-28-1941 (82 y.o. [name] Primary Care Antonie Borjon: Barbette Reichmann Other Clinician: Referring Kaleo Condrey: Treating Enoch Moffa/Extender: [name], [name], Vishwanath Weeks in Treatment: 58 Wound Status Wound Number: 15 Primary Pressure Ulcer Etiology: Wound Location: Left, Anterior Lower Leg Wound Open Wounding Event: Pressure Injury Status: Date Acquired: 04/11/2023 Comorbid Cataracts, Lymphedema, Hypertension, Peripheral Arterial Disease, Weeks Of Treatment: 29 History: Peripheral Venous Disease, Type II Diabetes, Osteoarthritis, Clustered Wound: Yes Neuropathy Photos CACIE, GOUDY [name] ([id number]) [id number] Page 13 of 21 Wound Measurements Length: (cm) 8.8 Width: (cm) 12 Depth: (cm)  0.1 Area: (cm) 82.938 Volume: (cm) 8.294 % Reduction in Area: -269.7% % Reduction in Volume: -269.8% Epithelialization: Large (67-100%) Wound Description Classification: Category/Stage IV Exudate Amount: Medium Exudate Type: Serosanguineous Exudate Color: red, brown Foul Odor After Cleansing: No Slough/Fibrino Yes Wound Bed Granulation Amount: None Present (0%) Exposed Structure Necrotic Amount: None Present (0%) Fascia Exposed: No Fat Layer (Subcutaneous Tissue) Exposed: Yes Tendon Exposed: Yes Muscle Exposed: No Joint Exposed: No Bone Exposed: No Treatment Notes Wound #15 (Lower Leg) Wound Laterality: Left, Anterior Cleanser Vashe 5.8 (oz) Discharge Instruction: damp to dry daily Peri-Wound Care Topical Primary Dressing Xeroform 5x9-HBD (in/in) Discharge Instruction: Apply Xeroform 5x9-HBD (in/in) as directed Secondary Dressing ABD Pad 5x9 (in/in) Discharge Instruction: Cover with ABD pad Kerlix 4.5 x 4.1 (in/yd) Discharge Instruction: Apply Kerlix 4.5 x 4.1 (in/yd) as instructed Secured With ACE WRAP - 51M ACE Elastic Bandage With VELCRO Brand Closure, 4 (in) Compression Wrap Compression Stockings Add-Ons Electronic Signature(s) Signed: 11/07/2023 4:36:19 PM By: Midge Aver MSN RN CNS WTA Previous Signature: 11/07/2023 4:36:01 PM Version By: Midge Aver MSN RN CNS WTA Entered By: Midge Aver on 11/07/2023 13:36:19 -------------------------------------------------------------------------------- Wound Assessment Details Patient Name: Date of Service: [facility], [name] 11/07/2023 3:30 PM Medical  Record Number: [id number] Patient Account Number: [id number] [name], [name] ([id number]) [id number] Page 14 of 21 Date of Birth/Sex: Treating RN: [birth year] (82 y.o. [name] Primary Care Jolie Strohecker: Barbette Reichmann Other Clinician: Referring Jeanann Balinski: Treating Kollins Fenter/Extender: [name], [name], Vishwanath Weeks  in Treatment: 58 Wound Status Wound Number: 16 Primary Pressure Ulcer Etiology: Wound Location: Left Trochanter Wound Open Wounding Event: Pressure Injury Status: Date Acquired: 05/22/2023 Comorbid Cataracts, Lymphedema, Hypertension, Peripheral Arterial Disease, Weeks Of Treatment: 24 History: Peripheral Venous Disease, Type II Diabetes, Osteoarthritis, Clustered Wound: No Neuropathy Photos Wound Measurements Length: (cm) 1.2 Width: (cm) 1 Depth: (cm) 0.8 Area: (cm) 0.942 Volume: (cm) 0.754 % Reduction in Area: 71% % Reduction in Volume: -132% Tunneling: Yes Position (o'clock): 6 Maximum Distance: (cm) 1.6 Wound Description Classification: Unstageable/Unclassified Exudate Amount: Medium Exudate Type: Serosanguineous Exudate Color: red, brown Foul Odor After Cleansing: No Slough/Fibrino Yes Wound Bed Necrotic Amount: Large (67-100%) Exposed Structure Necrotic Quality: Eschar, Adherent Slough Fascia Exposed: No Fat Layer (Subcutaneous Tissue) Exposed: Yes Tendon Exposed: No Muscle Exposed: No Joint Exposed: No Bone Exposed: No Treatment Notes Wound #16 (Trochanter) Wound Laterality: Left Cleanser Vashe 5.8 (oz) Discharge Instruction: Use as directed for cleaning wound Peri-Wound Care Topical Primary Dressing Prisma 4.34 (in) Discharge Instruction: pack into tunnel and wound Secondary Dressing Gauze Discharge Instruction: moisten with Vashe and place over collagen (BORDER) Zetuvit Plus SILICONE BORDER Dressing 5x5 (in/in) Discharge Instruction: Please do not put silicone bordered dressings under wraps. Use non-bordered dressing only. Secured With DEANNIE, [name] ([id number]) [id number] Page 15 of 21 Compression Wrap Compression Stockings Add-Ons Electronic Signature(s) Signed: 11/07/2023 4:36:39 PM By: Midge Aver MSN RN CNS WTA Entered By: Midge Aver on 11/07/2023  13:36:39 -------------------------------------------------------------------------------- Wound Assessment Details Patient Name: Date of Service: [facility], [name] 11/07/2023 3:30 PM Medical Record Number: [id number] Patient Account Number: [id number] Date of Birth/Sex: Treating RN: 07-22-1941 (82 y.o. [name] Primary Care Avelyn Touch: Barbette Reichmann Other Clinician: Referring Athan Casalino: Treating Carri Spillers/Extender: [name], [name], Vishwanath Weeks in Treatment: 58 Wound Status Wound Number: 18 Primary Pressure Ulcer Etiology: Wound Location: Right Metatarsal head first Wound Open Wounding Event: Pressure Injury Status: Date Acquired: 07/31/2023 Comorbid Cataracts, Lymphedema, Hypertension, Peripheral Arterial Disease, Weeks Of Treatment: 14 History: Peripheral Venous Disease, Type II Diabetes, Osteoarthritis, Clustered Wound: No Neuropathy Photos Wound Measurements Length: (cm) 1 Width: (cm) 1 Depth: (cm) 0.1 Area: (cm) 0.785 Volume: (cm) 0.079 % Reduction in Area: -5.2% % Reduction in Volume: -5.3% Epithelialization: None Wound Description Classification: Category/Stage II Exudate Amount: Medium Exudate Type: Serosanguineous Exudate Color: red, brown Foul Odor After Cleansing: No Slough/Fibrino Yes Wound Bed Granulation Amount: Medium (34-66%) Exposed Structure Granulation Quality: Red Fascia Exposed: No Necrotic Amount: Medium (34-66%) Fat Layer (Subcutaneous Tissue) Exposed: Yes Necrotic Quality: Eschar, Adherent Slough Tendon Exposed: No Muscle Exposed: No [name], [name] ([id number]) [id number] Page 16 of 21 Joint Exposed: No Bone Exposed: No Treatment Notes Wound #18 (Metatarsal head first) Wound Laterality: Right Cleanser Vashe 5.8 (oz) Discharge Instruction: damp to dry daily Peri-Wound Care Topical Primary Dressing Xeroform 5x9-HBD (in/in) Discharge Instruction: Apply Xeroform 5x9-HBD (in/in) as  directed Secondary Dressing ABD Pad 5x9 (in/in) Discharge Instruction: Cover with ABD pad Kerlix 4.5 x 4.1 (in/yd) Discharge Instruction: Apply Kerlix 4.5 x 4.1 (in/yd) as instructed Secured With ACE WRAP - 40M ACE Elastic Bandage With VELCRO Brand Closure, 4 (in) Compression Wrap Compression Stockings Add-Ons Electronic Signature(s) Signed: 11/07/2023 4:37:02 PM By: Midge Aver MSN RN  CNS WTA Entered By: Midge Aver on 11/07/2023 13:37:02 -------------------------------------------------------------------------------- Wound Assessment Details Patient Name: Date of Service: [facility], [name] 11/07/2023 3:30 PM Medical Record Number: [id number] Patient Account Number: [id number] Date of Birth/Sex: Treating RN: [birth year] (82 y.o. [name] Primary Care Deonta Bomberger: Barbette Reichmann Other Clinician: Referring Akua Blethen: Treating Benetta Maclaren/Extender: [name], [name], Vishwanath Weeks in Treatment: 58 Wound Status Wound Number: 19 Primary Venous Leg Ulcer Etiology: Wound Location: Right, Anterior Lower Leg Wound Open Wounding Event: Blister Status: Date Acquired: 09/17/2023 Comorbid Cataracts, Lymphedema, Hypertension, Peripheral Arterial Disease, Weeks Of Treatment: 7 History: Peripheral Venous Disease, Type II Diabetes, Osteoarthritis, Clustered Wound: Yes Neuropathy Photos NAKESHA, SAMORA [name] ([id number]) [id number] Page 17 of 21 Wound Measurements Length: (cm) 3 Width: (cm) 4.2 Depth: (cm) 0.1 Area: (cm) 9.896 Volume: (cm) 0.99 % Reduction in Area: -40% % Reduction in Volume: -40% Epithelialization: None Wound Description Classification: Full Thickness Without Exposed Support Exudate Amount: Medium Exudate Type: Serosanguineous Exudate Color: red, brown Structures Foul Odor After Cleansing: No Slough/Fibrino Yes Wound Bed Exposed Structure Fascia Exposed: No Fat Layer (Subcutaneous Tissue) Exposed: Yes Tendon  Exposed: No Muscle Exposed: No Joint Exposed: No Bone Exposed: No Treatment Notes Wound #19 (Lower Leg) Wound Laterality: Right, Anterior Cleanser Vashe 5.8 (oz) Discharge Instruction: damp to dry daily Peri-Wound Care Topical Primary Dressing Xeroform 5x9-HBD (in/in) Discharge Instruction: Apply Xeroform 5x9-HBD (in/in) as directed Secondary Dressing ABD Pad 5x9 (in/in) Discharge Instruction: Cover with ABD pad Kerlix 4.5 x 4.1 (in/yd) Discharge Instruction: Apply Kerlix 4.5 x 4.1 (in/yd) as instructed Secured With ACE WRAP - 11M ACE Elastic Bandage With VELCRO Brand Closure, 4 (in) Compression Wrap Compression Stockings Add-Ons Electronic Signature(s) Signed: 11/07/2023 4:37:24 PM By: Midge Aver MSN RN CNS WTA Entered By: Midge Aver on 11/07/2023 13:37:23 Tullius, [name] ([id number]) [id number] Page 18 of 21 -------------------------------------------------------------------------------- Wound Assessment Details Patient Name: Date of Service: [facility], [name] 11/07/2023 3:30 PM Medical Record Number: [id number] Patient Account Number: [id number] Date of Birth/Sex: Treating RN: [birth year] (82 y.o. [name] Primary Care [name]: Barbette Reichmann Other Clinician: Referring Elandra Powell: Treating Burwell Bethel/Extender: [name], [name], Vishwanath Weeks in Treatment: 58 Wound Status Wound Number: 20 Primary Pressure Ulcer Etiology: Wound Location: Right Gluteal fold Wound Open Wounding Event: Gradually Appeared Status: Date Acquired: 10/13/2023 Comorbid Cataracts, Lymphedema, Hypertension, Peripheral Arterial Disease, Weeks Of Treatment: 0 History: Peripheral Venous Disease, Type II Diabetes, Osteoarthritis, Clustered Wound: No Neuropathy Photos Wound Measurements Length: (cm) 1 Width: (cm) 0.7 Depth: (cm) 0.1 Area: (cm) 0.55 Volume: (cm) 0.055 % Reduction in Area: % Reduction in Volume: Epithelialization:  None Wound Description Classification: Unstageable/Unclassified Exudate Amount: Medium Exudate Type: Serosanguineous Exudate Color: red, brown Foul Odor After Cleansing: No Slough/Fibrino Yes Wound Bed Granulation Amount: None Present (0%) Exposed Structure Necrotic Amount: Large (67-100%) Fascia Exposed: No Necrotic Quality: Adherent Slough Fat Layer (Subcutaneous Tissue) Exposed: Yes Tendon Exposed: No Muscle Exposed: No Joint Exposed: No Bone Exposed: No Treatment Notes Wound #20 (Gluteal fold) Wound Laterality: Right Cleanser Soap and Water Discharge Instruction: Gently cleanse wound with antibacterial soap, rinse and pat dry prior to dressing wounds [name], [name] ([id number]) [id number] Page 19 of 21 Peri-Wound Care Topical Primary Dressing Xeroform-HBD 2x2 (in/in) Discharge Instruction: Apply Xeroform-HBD 2x2 (in/in) as directed Secondary Dressing (BORDER) Zetuvit Plus SILICONE BORDER Dressing 4x4 (in/in) Discharge Instruction: Please do not put silicone bordered dressings under wraps. Use non-bordered dressing only. Secured With Compression Wrap Compression Stockings Add-Ons Electronic  Signature(s) Signed: 11/07/2023 4:38:01 PM By: Midge Aver MSN RN CNS WTA Entered By: Midge Aver on 11/07/2023 13:38:00 -------------------------------------------------------------------------------- Wound Assessment Details Patient Name: Date of Service: [facility], [name] 11/07/2023 3:30 PM Medical Record Number: [id number] Patient Account Number: [id number] Date of Birth/Sex: Treating RN: [birth year] (82 y.o. [name] Primary Care Lizzette Carbonell: Barbette Reichmann Other Clinician: Referring Naija Troost: Treating Lochlyn Zullo/Extender: [name], [name], Vishwanath Weeks in Treatment: 58 Wound Status Wound Number: 6 Primary Pressure Ulcer Etiology: Wound Location: Right Calcaneus Wound Open Wounding Event: Pressure  Injury Status: Date Acquired: 07/23/2022 Comorbid Cataracts, Lymphedema, Hypertension, Peripheral Arterial Disease, Weeks Of Treatment: 58 History: Peripheral Venous Disease, Type II Diabetes, Osteoarthritis, Clustered Wound: No Neuropathy Photos Wound Measurements Length: (cm) 6 Width: (cm) 3 Depth: (cm) 0.1 Area: (cm) 14.137 Volume: (cm) 1.414 Hobday, [name] ([id number]) Wound Description Classification: Category/Stage III Wound Margin: Distinct, outline attached Exudate Amount: Medium Exudate Type: Serosanguineous Exudate Color: red, brown Foul Odor After Cleansing: No Slough/Fibrino Yes % Reduction in Area: -50% % Reduction in Volume: 50% Epithelialization: None [id number] Page 20 of 21 Wound Bed Granulation Amount: Medium (34-66%) Exposed Structure Granulation Quality: Red, Pink Fascia Exposed: No Necrotic Amount: Medium (34-66%) Fat Layer (Subcutaneous Tissue) Exposed: Yes Necrotic Quality: Eschar, Adherent Slough Tendon Exposed: No Muscle Exposed: No Joint Exposed: No Bone Exposed: No Treatment Notes Wound #6 (Calcaneus) Wound Laterality: Right Cleanser Vashe 5.8 (oz) Discharge Instruction: damp to dry daily Peri-Wound Care Topical Primary Dressing Xeroform 5x9-HBD (in/in) Discharge Instruction: Apply Xeroform 5x9-HBD (in/in) as directed Secondary Dressing ABD Pad 5x9 (in/in) Discharge Instruction: Cover with ABD pad Kerlix 4.5 x 4.1 (in/yd) Discharge Instruction: Apply Kerlix 4.5 x 4.1 (in/yd) as instructed Secured With ACE WRAP - 103M ACE Elastic Bandage With VELCRO Brand Closure, 4 (in) Compression Wrap Compression Stockings Add-Ons Electronic Signature(s) Signed: 11/07/2023 4:38:22 PM By: Midge Aver MSN RN CNS WTA Entered By: Midge Aver on 11/07/2023 13:38:22 -------------------------------------------------------------------------------- Vitals Details Patient Name: Date of Service: [facility], [name]  11/07/2023 3:30 PM Medical Record Number: [id number] Patient Account Number: [id number] Date of Birth/Sex: Treating RN: [birth year] (82 y.o. [name] Primary Care Ariba Lehnen: Barbette Reichmann Other Clinician: Referring Maxtyn Nuzum: Treating Baylea Milburn/Extender: [name], [name], Vishwanath Weeks in Treatment: 58 Vital Signs Juniel, Zarin [name] ([id number]) [id number] Page 21 of 21 Time Taken: 15:29 Temperature (F): 98.3 Weight (lbs): 98 Pulse (bpm): 109 Respiratory Rate (breaths/min): 18 Blood Pressure (mmHg): 112/67 Reference Range: 80 - 120 mg / dl Electronic Signature(s) Signed: 11/07/2023 4:44:21 PM By: Midge Aver MSN RN CNS WTA Entered By: Midge Aver on 11/07/2023 12:30:57

## 2023-11-08 NOTE — Progress Notes (Signed)
[name], [name] ([id number]) [id number] Page 1 of 12 Visit Report for 11/07/2023 HPI Details Patient Name: Date of Service: [name], [name]. 11/07/2023 3:30 PM Medical Record Number: [id number] Patient Account Number: [id number] Date of Birth/Sex: Treating RN: 23-Jun-1941 (82 y.o. [name] Primary Care Provider: Barbette [name] Other Clinician: Referring Provider: Treating Provider/Extender: [name], [name], [name] in Treatment: 55 History of Present Illness HPI Description: 82 year old patient who comes with a referral for bilateral lower extremity edema and a lower extremity ulceration and has been sent by her PCP [name]. I understand the patient was recently put on amoxicillin and doxycycline but could not tolerate the amoxicillin. doxycycline course was completed. a BNP and EKG was supposed to be normal and the patient did not have any dyspnea. the patient has been on a diuretic. The patient was also prescribed a pair of elastic compression stockings of the 20-30 mmHg pressure variety. x-ray of the right ankle was done on 09/20/2015 and showed posttraumatic and postsurgical changes of the right ankle with secondary degenerative changes of the tibiotalar joint and to a lesser degree the subtalar joint. No definite acute bony abnormalities are noted. Past medical history significant for diabetes mellitus, hypertension, hyperlipidemia, right breast cancer treated with a mastectomy in 2014. She has never smoked. 10/24/2015 -- she had delayed her vascular test because of her husband surgery but she is now ready to get him taken care of. He is also unable to use compression stockings and hence we will need to order her Juzo wraps. 10/31/2015-- was seen by [name] on 10/28/2015. She had a left lower extremity arterial duplex done at his office a couple of years ago and that was essentially normal. Today they performed a  venous duplex which revealed no evidence of deep vein thrombosis, superficial thrombophlebitis, no venous reflex was seen on the right and a minimal amount of reflux was seen on the left great saphenous vein but no significant reflux was seen. Impression was that there was a component of lymphedema present from a previous surgery and he would recommend compression stockings and [name] elevation. 10/2; is a patient we are following every 2 [name]. Apparently seen by Dr. Joylene [name] since she was last here no a day additional antibiotics were added. There was some suggestion about US obtaining cultures and we could bring them to her attention. The patient has stage IV wound over the right greater trochanter and unstageable area on the left but it is probably stage IV as well. She has a small area on the tip of her left heel larger wounds on the left [name] left medial foot, left medial ankle Readmission: 07-24-2022 upon evaluation today patient presents for initial inspection here in our clinic concerning issues that she has been having with her legs this is actually been going on for several years according to what her family member with her today tells me as well as what the patient reiterates as well. She is currently most recently been seeing [name] and subsequently he had her in [place] boots. However 2 [name] ago he referred her to [place] and then subsequently took her out of the [place] boot wraps at that point. At this time the left [name] looks to be worse in the right [name] currently. She is on Lasix and lisinopril with hydrochlorothiazide she has high blood pressure she also has issues currently with lower extremity swelling and edema which has been an ongoing issue for  her as well. Patient does have a history of chronic venous hypertension, lymphedema, diabetes mellitus type 2, hypertension, peripheral vascular disease, and neuropathy. Currently she is on Lasix as well as lisinopril with  hydrochlorothiazide. 08-02-2022 upon evaluation today patient presents for reevaluation the good news is she is actually doing somewhat better in regard to the wound and the overall appearance and sinuses. The unfortunate thing is her infection really is not significantly improved we did have to switch out her antibiotic once we got that final result back and I switched her to Levaquin and away from the doxycycline. Unfortunately the doxycycline had been doing poorly for her. In fact she had had diarrhea from the time she started taking it on Friday and she is still having it when she shows up today for evaluation. Again I was not aware of this and obviously she does appear to be somewhat dehydrated as well based on what I see. My concern which I discussed with the patient today is the possibility of a C. difficile infection. With that being said fact this started immediately upon taking the doxycycline makes me think that it was just the medicine and is not completely out of her system yet despite having taken the last dose Tuesday morning. Nonetheless with what we are seeing currently I want to be sure that reason I Minna contact her primary care provider and see if a would be willing to see her and test for C. difficile infection. 08-07-2022 upon evaluation today patient appears to be doing well currently in regard to her wounds which are actually measuring much better this is great news. Fortunately I do not see any signs of active infection locally or systemically at this time which is great as well. The good news is she was tested for a C. difficile infection and it was negative. I am very pleased and thankful for primary care provider for doing that so this means that she was just having a severe reaction to the doxycycline we have added that to her allergy list at this point. 08-14-2022 upon evaluation today patient appears to be doing well currently in regard to her dehydration she is actually  significantly improved compared to last time I saw her last week. With that being said I do not see any evidence of active infection systemically at this time which is great news. However locally she still does appear to have cellulitis in left lower extremity. I discussed with her today that I do believe she would benefit from going ahead and starting the Levaquin. Again we will concerned about the C. difficile infection of the diarrhea but that this turned out to be just an issue with a reaction to the doxycycline. My hope is that the Levaquin will not cause her any complication and will be able to treat the infection. I did review her arterial study as well which was dated on 07-31-2022. It showed that she had a ABI on the right of 1.30 and on the left of 1.27 with a TBI on the right of 1.12 and the left 1.21 this is a normal arterial study. Again on the patient's wound culture she actually did show evidence of Proteus, Morganella, and MRSA. Levaquin is a good option here across the board. [name], [name] ([id number]) [id number] Page 2 of 12 09/26/2022 Ms. Diane Fetty is a 82 year old female with a past medical history of type 2 diabetes currently controlled on oral agents, venous insufficiency/lymphedema, right breast cancer and chronic diastolic heart  failure that presents to the clinic for a 1 month history of nonhealing wounds to the left lower extremity, right heel and left buttocks. She states that the buttocks wound and the right heel wound developed while she was in the hospital. She was admitted on 08/22/2022 for severe sepsis secondary to acute sigmoid and distal colonic diverticulitis. At that time it was noted she had a stage I decubitus ulcer to her bilateral buttocks. A wound to the heel was not mentioned. She currently denies systemic signs of infection. She came into clinic in a blue gown with no undergarments. She has not been dressing the wounds.  She states she just recently obtained home health and they are coming out for the first time this week. She is seen in our clinic often for lower extremity wounds secondary to venous insufficiency. 11/22; patient presents for follow-up. Son is present during the encounter. Per son it sounds like they are not doing any dressing changes. She states she has home health but they have not come out. She is going to let us know which home health agency she has been approved for so we can send orders. Currently she denies signs of infection. 12/13; patient presents for follow-up. Patient has home health and they are coming out once a week. It appears that there has not been any dressing changes except for with home health. Patient has a newly discovered wound to the left foot. There is exposed bone. There is slight erythema and increased warmth to the surrounding tissue. Patient is completely unaware of this wound. 12/20; patient presents for follow-up. She has been taking her oral antibiotics. She now has an eschar and wound to the right hip. She has been using Dakin's wet-to-dry dressings to the left foot wound and buttocks wound. She has been using Medihoney and silver alginate to the right heel wound. She currently denies systemic signs of infection. She is mainly bedbound or in a wheelchair. 1/10; patient presents for follow-up. Patient had her left second toenail removed by [name], podiatry on 12/21. Unfortunately she did not feel well and she was admitted to the hospital for sepsis. Her right heel wound was thought to be infected and this was debrided in the OR by [name]. Culture and bone biopsy had no growth. She has been using Medihoney to all the wound beds except for the lefty buttocks wound for which she uses Dakin's wet-to-dry dressings. She has developed a pressure injury to the left heel now. This is despite having Prevalon boots. Although she is not wearing them today. She  has completed 4 [name] of Augmentin and also a week of doxycycline for osteomyelitis of the left foot. Her left foot wound no longer probes to bone. Currently she denies signs of infection. 1/24; patient presents for follow-up. She has been using Medihoney and Hydrofera Blue to the wound beds except for this buttocks wound she has been using Dakin's wet-to-dry dressings. She has developed 2 new wounds 1 to the left heel and the other to the right medial ankle. She claims she is wearing her Prevalon boots all the time although she does not have them on today. She currently denies signs of infection. 2/21; patient presents for follow-up. She was recently hospitalized on 12/19/2022 for sepsis secondary to Enterococcus bacillus bacteremia. She was treated with IV antibiotics and has completed her discharge oral antibiotics. She had a CT scanning of the abdomen/pelvis without acute abnormalities. Husband is present with patient today. They have been  using Dakin's wet-to-dry dressings to the sacral wound and hip wound and Hydrofera Blue and Medihoney to the feet wounds. They are not changing the dressings daily. It is unclear how often they actually change the dressings. She has been wearing her Prevalon boots at night but not during the day. She currently denies systemic signs of infection. 3/20; patient presents for follow-up. She again was in the hospital for severe sepsis on 2/24; MRIs at that time showed findings suspicious for osteomyelitis to the right hip. She is currently on Augmentin to complete 4 [name] of this. She is following with ID. She has been using Dakin's wet-to-dry dressings to the sacrum and right hip however the solution is starting to cause discomfort. T the rest of the wound she has been using Hydrofera Blue and Medihoney. Patient o and son are not interested in doing palliative care or hospice. They state that they had discussions while in the hospital. 4/17; patient presents for  follow-up. She has been using Vashe wet-to-dry dressings to the sacrum and right trochanter wound. She has been using Medihoney and Hydrofera Blue to the bilateral feet wounds. She has no issues or complaints today. She denies signs of infection. 5/1; patient developed a new wound to the right [name]. Son was present and states that there was a scab and he removed it creating a wound. He has been keeping the area covered. T the sacral and right trochanter wound they have been using Vashe wet-to-dry dressings and Medihoney and Hydrofera Blue to the o bilateral feet wounds. She denies signs of infection. 6/1; 1 month follow-up. She has a new area on the left anterior lower [name]. In the meantime the sacral ulcer is closed. She still has an extensive wound on the right hip with exposed tendon over the greater trochanter. On the lower extremities the left anterior lower [name] wound is new right ankle left heel and right heel all look reasonably stable. We are using Vashe wet-to-dry on the hip Medihoney and Hydrofera Blue on the lower extremities. She is accompanied by her son I think who is doing the dressings. They do not have a pressure-relief surface at home but they do have a hospital bed 7/10; I have not seen this patient since 03/13/2023. She was scheduled to be seen on 5/29 however missed her appointment due to illness. She was seen on 6/5 by Dr. Leanord Hawking and she presents today for her follow-up. Unfortunately her wounds have declined since I last saw her. She has several new wounds including one to the left hip wound and one left anterior lower [name]. The right hip wound has declined and has maggots with bone exposed. She has been using Vashe wet- to-dry dressings to Right hip wound and Medihoney and Hydrofera Blue to the The remaining wounds. Sacral wound remains closed. 7/24; patient presents for follow-up. At last clinic visit I recommended she go to the ED for potential admission, IV antibiotics and  further imaging. She had MRI of the pelvis and left foot that showed osteomyelitis. Podiatry advised conservative management. She was also seen by infectious disease who discharged patient on 2 [name] of linezolid and 2 [name] of ciprofloxacin. Patient is at home and has home health. Palliative care was consulted during the stay and she is full code and has declined further outpatient palliative care and hospice services.. She currently denies systemic signs of infection. Overall there is improvement in the appearance of the wounds. There is healthier granulation tissue present. However this is  overall poor prognosis case. She has Prevalon boots but it is unclear if she is using these. She does not have them on today. She follows up with infectious disease next week. 8/7; Patient's been using Vashe wet-to-dry dressing to all the wound beds except for the left hip she has been using Santyl. She followed up with infectious disease on 8/1. Currently she is on linezolid and ciprofloxacin. They recommended lab work to see if she needs extension of her oral antibiotics. She currently denies systemic signs of infection. 8/21; patient presents for follow-up. She has been using Vashe wet-to-dry dressings to all wounds except the left hip she has been using Santyl. All wounds have improved in size and appearance. She has healthy granulation tissue present to all wounds except the left hip still has nonviable tissue throughout. She states she has completed her oral antibiotics per ID. She currently denies systemic signs of infection. 9/4; patient presents for follow-up. She has been using Vashe wet-to-dry dressings to all wounds except the left hip. T the left hip she has been using Santyl. o All wounds appear well-healing. She currently denies systemic signs of infection. 9/18; 2-week follow-up. This is a very disabled woman who is nonambulatory. She has bilateral hip and knee flexion contractures which are  fairly severe. She has significant bilateral lower extremity pressure ulcers bilaterally. 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 She is not ambulatory. She has extensive wounds on both trochanters. The right has exposed bone in the left nonviable tissue. I aggressively debrided this the last time she was here. We have been using Santyl on the left hip and Vashe wet-to-dry on the right hip. She has wounds on her bilateral lower legs extensively on the right medial foot first met head ankle and lower [name] area on the left lower [name] and left heel. All of these seem somewhat improved again using Vashe wet-to-dry. We have been attempting to get a level 3 surface or some form of offloading surface for the patient although having trouble with their insurance which apparently is [facility], Yvanna [name] ([id number]) [id number] Page 3 of 12 10/16; patient is doing very well. The wounds on the lower extremity even the extensive areas seem to be a lot better with healthy granulation. We have been using the [employer] equivalent of Vashe wet-to-dry. There is areas over the lateral part of the greater trochanters. The area on the left looks a lot better we have been using Santyl with backing wet to dry gauze. We have been using Vashe wet-to-dry in the right greater trochanter. I think we are in line with changing the dressing next week to both hip areas We are apparently finally making some inroads in getting her a level 3 surface for her bed 10/23; patient continues to make nice improvement in the bilateral lower [name] wounds using the equivalent of Vashe wet-to-dry. I did plan to change this dressing but the wounds are making such a nice improvement I continued it. T oday I have changed the greater trochanter stage IV pressure ulcers to a Prisma and  then hydrogel wet-to-dry packing with border foam change every 2 D. Really remarkable improvement credit to the family this looking after her 09-18-2023 upon evaluation today patient appears to be doing more poorly especially in regard to her heels and lower extremities where she has deep tissue  injuries on both heels and she has new blisters that have opened up on her legs. This is due to the fact that she is not elevating her legs she is staying in her wheelchair sitting not moving around throughout the day and this is not the best thing for her to be honest. Fortunately I do not think she is infected right now but at the same time this is still concerning to me. 10-02-2023 upon evaluation today patient appears to be doing really about the same in regard to her wounds. There is not much improvement here unfortunately. I do not see any signs of active infection at this time which is good news. With that being said she still has significant wounds on both hips the left is better than the right she also has many wounds scattered over her legs as well as both heels. 10-23-2023 patient continues to have significant issues with her wounds in general. With that being said fortunately I do not see any signs of active infection at this time which is great news and in general I believe that we are making decent progress all things considered at this point. The heels are 1 exception she continues to have too much pressure to the heels unfortunately. With all that being said the patient is unfortunately still not using the offloading boots as much as she should be. I explained to her that this is going be of utmost importance that she is can to see this heal and get better. She voiced understanding and her son states they will begin using them on a regular basis. 11/07/2023; this patient I remember from coverage in the fall of this year. She is a patient with multiple pressure ulcers. She also has chronic  venous insufficiency in her legs. She has eschared areas on her bilateral plantar heels wounds on both lower legs. She has areas over the right greater trochanter that still probes to bone, left greater trochanter. She has a new wound area on the right buttock which is probably a stage II. We are bort using Xeroform and bordered foam on most of the wounds on the lower extremities. Betadine Xeroform and gauze on the heels and Prisma as the primary dressing on the hips. Her legs are held in tight flexion contractures at the hips and knees. Electronic Signature(s) Signed: 11/08/2023 12:26:56 PM By: Baltazar Najjar MD Entered By: Baltazar Najjar on 11/07/2023 13:30:54 -------------------------------------------------------------------------------- Physical Exam Details Patient Name: Date of Service: [facility], [name]. 11/07/2023 3:30 PM Medical Record Number: [id number] Patient Account Number: [id number] Date of Birth/Sex: Treating RN: 24-Mar-1941 (82 y.o. [name] Primary Care Provider: Barbette [name] Other Clinician: Referring Provider: Treating Provider/Extender: [name], [name], [name] in Treatment: 58 Constitutional Sitting or standing Blood Pressure is within target range for patient.. Pulse regular and within target range for patient.Marland Kitchen Respirations regular, non-labored and within target range.. Temperature is normal and within the target range for the patient.Marland Kitchen appears in no distress. Notes Wound exam; she has black eschar areas on both heels. Significant wounds on the right medial [name] right anterior [name] and to a lesser to [name]. She has the 2 areas on her right greater trochanter this still probes to bone the left greater trochanter which is measurably smaller and at least in my mind. Her peripheral pulses are somewhat difficult to feel she does not have uncontrolled edema Electronic Signature(s) Signed: 11/08/2023 12:26:56 PM  By: Leanord Hawking,  Casimiro Needle MD Entered By: Baltazar Najjar on 11/07/2023 13:31:55 Pieroni, [name] ([id number]) [id number] Page 4 of 12 -------------------------------------------------------------------------------- Physician Orders Details Patient Name: Date of Service: [name], [name]. 11/07/2023 3:30 PM Medical Record Number: [id number] Patient Account Number: [id number] Date of Birth/Sex: Treating RN: 08/25/1941 (82 y.o. [name] Primary Care Provider: Barbette [name] Other Clinician: Referring Provider: Treating Provider/Extender: [name], [name], [name] in Treatment: 41 The following information was scribed by: Midge Aver The information was scribed for: Maxwell Caul Verbal / Phone Orders: No Diagnosis Coding ICD-10 Coding Code Description [phone] Non-pressure chronic ulcer of other part of left lower [name] with fat layer exposed L97.812 Non-pressure chronic ulcer of other part of right lower [name] with fat layer exposed I87.312 Chronic venous hypertension (idiopathic) with ulcer of left lower extremity I89.0 Lymphedema, not elsewhere classified E11.622 Type 2 diabetes mellitus with other skin ulcer L89.610 Pressure ulcer of right heel, unstageable L89.513 Pressure ulcer of right ankle, stage 3 L89.620 Pressure ulcer of left heel, unstageable M86.172 Other acute osteomyelitis, left ankle and foot L89.214 Pressure ulcer of right hip, stage 4 L89.220 Pressure ulcer of left hip, unstageable I50.32 Chronic diastolic (congestive) heart failure Z79.84 Long term (current) use of oral hypoglycemic drugs Follow-up Appointments Return Appointment in 2 [name]. Home Health Home Health Company: - Harlin Rain, RN [phone] [name] for wound care. May utilize formulary equivalent dressing for wound treatment orders unless otherwise specified. Home Health Nurse may visit PRN to address patients wound care  needs. Off-Loading Low air-loss mattress (Group 2) Wound Treatment Wound #11 - Trochanter Wound Laterality: Right Cleanser: Vashe 5.8 (oz) 1 x Per Day/30 Days Discharge Instructions: Use as directed for cleaning wound Prim Dressing: Prisma 4.34 (in) 1 x Per Day/30 Days ary Discharge Instructions: pack into tunnel and wound Secondary Dressing: Gauze 1 x Per Day/30 Days Discharge Instructions: moisten with Vashe and place over collagen Secondary Dressing: (BORDER) Zetuvit Plus SILICONE BORDER Dressing 5x5 (in/in) 1 x Per Day/30 Days Discharge Instructions: Please do not put silicone bordered dressings under wraps. Use non-bordered dressing only. Wound #13 - Calcaneus Wound Laterality: Left Cleanser: Vashe 5.8 (oz) (Home Health) 1 x Per Day/30 Days Discharge Instructions: damp to dry daily [name], [name] ([id number]) [id number] Page 5 of 12 Topical: Betadine 1 x Per Day/30 Days Discharge Instructions: Apply betadine to heel Secondary Dressing: ABD Pad 5x9 (in/in) 1 x Per Day/30 Days Discharge Instructions: Cover with ABD pad Secondary Dressing: Kerlix 4.5 x 4.1 (in/yd) 1 x Per Day/30 Days Discharge Instructions: Apply Kerlix 4.5 x 4.1 (in/yd) as instructed Secured With: ACE WRAP - 67M ACE Elastic Bandage With VELCRO Brand Closure, 4 (in) 1 x Per Day/30 Days Wound #14 - Lower [name] Wound Laterality: Right, Medial, Distal Cleanser: Vashe 5.8 (oz) (Home Health) 1 x Per Day/30 Days Discharge Instructions: damp to dry daily Prim Dressing: Xeroform 5x9-HBD (in/in) 1 x Per Day/30 Days ary Discharge Instructions: Apply Xeroform 5x9-HBD (in/in) as directed Secondary Dressing: ABD Pad 5x9 (in/in) 1 x Per Day/30 Days Discharge Instructions: Cover with ABD pad Secondary Dressing: Kerlix 4.5 x 4.1 (in/yd) 1 x Per Day/30 Days Discharge Instructions: Apply Kerlix 4.5 x 4.1 (in/yd) as instructed Secured With: ACE WRAP - 67M ACE Elastic Bandage With VELCRO Brand Closure, 4  (in) 1 x Per Day/30 Days Wound #15 - Lower [name] Wound Laterality: Left, Anterior Cleanser: Vashe 5.8 (oz) (Home Health) 1 x Per Day/30 Days Discharge Instructions: damp to dry  daily Prim Dressing: Xeroform 5x9-HBD (in/in) 1 x Per Day/30 Days ary Discharge Instructions: Apply Xeroform 5x9-HBD (in/in) as directed Secondary Dressing: ABD Pad 5x9 (in/in) 1 x Per Day/30 Days Discharge Instructions: Cover with ABD pad Secondary Dressing: Kerlix 4.5 x 4.1 (in/yd) 1 x Per Day/30 Days Discharge Instructions: Apply Kerlix 4.5 x 4.1 (in/yd) as instructed Secured With: ACE WRAP - 39M ACE Elastic Bandage With VELCRO Brand Closure, 4 (in) 1 x Per Day/30 Days Wound #16 - Trochanter Wound Laterality: Left Cleanser: Vashe 5.8 (oz) 1 x Per Day/30 Days Discharge Instructions: Use as directed for cleaning wound Prim Dressing: Prisma 4.34 (in) 1 x Per Day/30 Days ary Discharge Instructions: pack into tunnel and wound Secondary Dressing: Gauze 1 x Per Day/30 Days Discharge Instructions: moisten with Vashe and place over collagen Secondary Dressing: (BORDER) Zetuvit Plus SILICONE BORDER Dressing 5x5 (in/in) 1 x Per Day/30 Days Discharge Instructions: Please do not put silicone bordered dressings under wraps. Use non-bordered dressing only. Wound #18 - Metatarsal head first Wound Laterality: Right Cleanser: Vashe 5.8 (oz) (Home Health) 1 x Per Day/30 Days Discharge Instructions: damp to dry daily Prim Dressing: Xeroform 5x9-HBD (in/in) 1 x Per Day/30 Days ary Discharge Instructions: Apply Xeroform 5x9-HBD (in/in) as directed Secondary Dressing: ABD Pad 5x9 (in/in) 1 x Per Day/30 Days Discharge Instructions: Cover with ABD pad Secondary Dressing: Kerlix 4.5 x 4.1 (in/yd) 1 x Per Day/30 Days Discharge Instructions: Apply Kerlix 4.5 x 4.1 (in/yd) as instructed Secured With: ACE WRAP - 39M ACE Elastic Bandage With VELCRO Brand Closure, 4 (in) 1 x Per Day/30 Days Wound #19 - Lower [name] Wound Laterality: Right,  Anterior Cleanser: Vashe 5.8 (oz) (Home Health) 1 x Per Day/30 Days Discharge Instructions: damp to dry daily Prim Dressing: Xeroform 5x9-HBD (in/in) 1 x Per Day/30 Days ary Discharge Instructions: Apply Xeroform 5x9-HBD (in/in) as directed FLORIDE, CRAVOTTA [name] ([id number]) [id number] Page 6 of 12 Secondary Dressing: ABD Pad 5x9 (in/in) 1 x Per Day/30 Days Discharge Instructions: Cover with ABD pad Secondary Dressing: Kerlix 4.5 x 4.1 (in/yd) 1 x Per Day/30 Days Discharge Instructions: Apply Kerlix 4.5 x 4.1 (in/yd) as instructed Secured With: ACE WRAP - 39M ACE Elastic Bandage With VELCRO Brand Closure, 4 (in) 1 x Per Day/30 Days Wound #20 - Gluteal fold Wound Laterality: Right Cleanser: Soap and Water 1 x Per Day/30 Days Discharge Instructions: Gently cleanse wound with antibacterial soap, rinse and pat dry prior to dressing wounds Prim Dressing: Xeroform-HBD 2x2 (in/in) 1 x Per Day/30 Days ary Discharge Instructions: Apply Xeroform-HBD 2x2 (in/in) as directed Secondary Dressing: (BORDER) Zetuvit Plus SILICONE BORDER Dressing 4x4 (in/in) 1 x Per Day/30 Days Discharge Instructions: Please do not put silicone bordered dressings under wraps. Use non-bordered dressing only. Wound #6 - Calcaneus Wound Laterality: Right Cleanser: Vashe 5.8 (oz) (Home Health) 1 x Per Day/30 Days Discharge Instructions: damp to dry daily Prim Dressing: Xeroform 5x9-HBD (in/in) 1 x Per Day/30 Days ary Discharge Instructions: Apply Xeroform 5x9-HBD (in/in) as directed Secondary Dressing: ABD Pad 5x9 (in/in) 1 x Per Day/30 Days Discharge Instructions: Cover with ABD pad Secondary Dressing: Kerlix 4.5 x 4.1 (in/yd) 1 x Per Day/30 Days Discharge Instructions: Apply Kerlix 4.5 x 4.1 (in/yd) as instructed Secured With: ACE WRAP - 39M ACE Elastic Bandage With VELCRO Brand Closure, 4 (in) 1 x Per Day/30 Days Electronic Signature(s) Signed: 11/07/2023 4:44:21 PM By: Midge Aver MSN RN CNS  WTA Signed: 11/08/2023 12:26:56 PM By: Baltazar Najjar MD Entered By: Midge Aver on 11/07/2023 13:40:26 --------------------------------------------------------------------------------  Problem List Details Patient Name: Date of Service: [facility] [name], [name]. 11/07/2023 3:30 PM Medical Record Number: [id number] Patient Account Number: [id number] Date of Birth/Sex: Treating RN: 12/04/1940 (82 y.o. 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 09/26/2022 No Yes exposed L97.812 Non-pressure chronic ulcer of other part of right lower [name] with fat layer 03/13/2023 No Yes [name], [name] ([id number]) [id number] Page 7 of 12 exposed I87.312 Chronic venous hypertension (idiopathic) with ulcer of left lower extremity 09/26/2022 No Yes I89.0 Lymphedema, not elsewhere classified 09/26/2022 No Yes E11.622 Type 2 diabetes mellitus with other skin ulcer 09/26/2022 No Yes L89.610 Pressure ulcer of right heel, unstageable 09/26/2022 No Yes L89.513 Pressure ulcer of right ankle, stage 3 12/05/2022 No Yes L89.620 Pressure ulcer of left heel, unstageable 12/05/2022 No Yes M86.172 Other acute osteomyelitis, left ankle and foot 10/24/2022 No Yes L89.214 Pressure ulcer of right hip, stage 4 11/21/2022 No Yes L89.220 Pressure ulcer of left hip, unstageable 05/22/2023 No Yes I50.32 Chronic diastolic (congestive) heart failure 09/26/2022 No Yes Z79.84 Long term (current) use of oral hypoglycemic drugs 03/13/2023 No Yes Inactive Problems ICD-10 Code Description Active Date Inactive Date S81.802A Unspecified open wound, left lower [name], initial encounter 05/22/2023 05/22/2023 Resolved  Problems ICD-10 Code Description Active Date Resolved Date L89.320 Pressure ulcer of left buttock, unstageable 09/26/2022 09/26/2022 S91.302A Unspecified open wound, left foot, initial encounter 10/24/2022 10/24/2022 Electronic Signature(s) Signed: 11/08/2023 12:26:56 PM By: Baltazar Najjar MD Entered By: Baltazar Najjar on 11/07/2023 13:28:49 Cicalese, [name] ([id number]) [id number] Page 8 of 12 -------------------------------------------------------------------------------- Progress Note Details Patient Name: Date of Service: [name], [name]. 11/07/2023 3:30 PM Medical Record Number: [id number] Patient Account Number: [id number] Date of Birth/Sex: Treating RN: 1941-01-28 (82 y.o. [name] Primary Care Provider: Barbette [name] Other Clinician: Referring Provider: Treating Provider/Extender: [name], [name], [name] in Treatment: 58 Subjective History of Present Illness (HPI) 82 year old patient who comes with a referral for bilateral lower extremity edema and a lower extremity ulceration and has been sent by her PCP [name]. I understand the patient was recently put on amoxicillin and doxycycline but could not tolerate the amoxicillin. doxycycline course was completed. a BNP and EKG was supposed to be normal and the patient did not have any dyspnea. the patient has been on a diuretic. The patient was also prescribed a pair of elastic compression stockings of the 20-30 mmHg pressure variety. x-ray of the right ankle was done on 09/20/2015 and showed posttraumatic and postsurgical changes of the right ankle with secondary degenerative changes of the tibiotalar joint and to a lesser degree the subtalar joint. No definite acute bony abnormalities are noted. Past medical history significant for diabetes mellitus, hypertension, hyperlipidemia, right breast cancer treated with a mastectomy in 2014. She has  never smoked. 10/24/2015 -- she had delayed her vascular test because of her husband surgery but she is now ready to get him taken care of. He is also unable to use compression stockings and hence we will need to order her Juzo wraps. 10/31/2015-- was seen by [name] on 10/28/2015. She had a left lower extremity arterial duplex done at his office a couple of years ago and that was essentially normal. Today they performed  a venous duplex which revealed no evidence of deep vein thrombosis, superficial thrombophlebitis, no venous reflex was seen on the right and a minimal amount of reflux was seen on the left great saphenous vein but no significant reflux was seen. Impression was that there was a component of lymphedema present from a previous surgery and he would recommend compression stockings and [name] elevation. 10/2; is a patient we are following every 2 [name]. Apparently seen by Dr. Joylene [name] since she was last here no a day additional antibiotics were added. There was some suggestion about US obtaining cultures and we could bring them to her attention. The patient has stage IV wound over the right greater trochanter and unstageable area on the left but it is probably stage IV as well. She has a small area on the tip of her left heel larger wounds on the left [name] left medial foot, left medial ankle Readmission: 07-24-2022 upon evaluation today patient presents for initial inspection here in our clinic concerning issues that she has been having with her legs this is actually been going on for several years according to what her family member with her today tells me as well as what the patient reiterates as well. She is currently most recently been seeing [name] and subsequently he had her in [place] boots. However 2 [name] ago he referred her to [place] and then subsequently took her out of the [place] boot wraps at that point. At this time the left [name] looks to be worse in the right [name] currently. She is on  Lasix and lisinopril with hydrochlorothiazide she has high blood pressure she also has issues currently with lower extremity swelling and edema which has been an ongoing issue for her as well. Patient does have a history of chronic venous hypertension, lymphedema, diabetes mellitus type 2, hypertension, peripheral vascular disease, and neuropathy. Currently she is on Lasix as well as lisinopril with hydrochlorothiazide. 08-02-2022 upon evaluation today patient presents for reevaluation the good news is she is actually doing somewhat better in regard to the wound and the overall appearance and sinuses. The unfortunate thing is her infection really is not significantly improved we did have to switch out her antibiotic once we got that final result back and I switched her to Levaquin and away from the doxycycline. Unfortunately the doxycycline had been doing poorly for her. In fact she had had diarrhea from the time she started taking it on Friday and she is still having it when she shows up today for evaluation. Again I was not aware of this and obviously she does appear to be somewhat dehydrated as well based on what I see. My concern which I discussed with the patient today is the possibility of a C. difficile infection. With that being said fact this started immediately upon taking the doxycycline makes me think that it was just the medicine and is not completely out of her system yet despite having taken the last dose Tuesday morning. Nonetheless with what we are seeing currently I want to be sure that reason I Minna contact her primary care provider and see if a would be willing to see her and test for C. difficile infection. 08-07-2022 upon evaluation today patient appears to be doing well currently in regard to her wounds which are actually measuring much better this is great news. Fortunately I do not see any signs of active infection locally or systemically at this time which is great as well. The  good news is  she was tested for a C. difficile infection and it was negative. I am very pleased and thankful for primary care provider for doing that so this means that she was just having a severe reaction to the doxycycline we have added that to her allergy list at this point. 08-14-2022 upon evaluation today patient appears to be doing well currently in regard to her dehydration she is actually significantly improved compared to last time I saw her last week. With that being said I do not see any evidence of active infection systemically at this time which is great news. However locally she still does appear to have cellulitis in left lower extremity. I discussed with her today that I do believe she would benefit from going ahead and starting the Levaquin. Again we will concerned about the C. difficile infection of the diarrhea but that this turned out to be just an issue with a reaction to the doxycycline. My hope is that the Levaquin will not cause her any complication and will be able to treat the infection. I did review her arterial study as well which was dated on 07-31-2022. It showed that she had a ABI on the right of 1.30 and on the left of 1.27 with a TBI on the right of 1.12 and the left 1.21 this is a normal arterial study. Again on the patient's wound culture she actually did show evidence of Proteus, Morganella, and MRSA. Levaquin is a good option here across the board. 09/26/2022 Ms. Diane Becknell is a 82 year old female with a past medical history of type 2 diabetes currently controlled on oral agents, venous insufficiency/lymphedema, right breast cancer and chronic diastolic heart failure that presents to the clinic for a 1 month history of nonhealing wounds to the left lower extremity, right heel and left buttocks. She states that the buttocks wound and the right heel wound developed while she was in the hospital. She was admitted on 08/22/2022 for severe sepsis secondary to acute  sigmoid and distal colonic diverticulitis. At that time it was noted she had a stage I decubitus Hert, Chasitty [name] ([id number]) [id number].pdf Page 9 of 12 ulcer to her bilateral buttocks. A wound to the heel was not mentioned. She currently denies systemic signs of infection. She came into clinic in a blue gown with no undergarments. She has not been dressing the wounds. She states she just recently obtained home health and they are coming out for the first time this week. She is seen in our clinic often for lower extremity wounds secondary to venous insufficiency. 11/22; patient presents for follow-up. Son is present during the encounter. Per son it sounds like they are not doing any dressing changes. She states she has home health but they have not come out. She is going to let us know which home health agency she has been approved for so we can send orders. Currently she denies signs of infection. 12/13; patient presents for follow-up. Patient has home health and they are coming out once a week. It appears that there has not been any dressing changes except for with home health. Patient has a newly discovered wound to the left foot. There is exposed bone. There is slight erythema and increased warmth to the surrounding tissue. Patient is completely unaware of this wound. 12/20; patient presents for follow-up. She has been taking her oral antibiotics. She now has an eschar and wound to the right hip. She has been using Dakin's wet-to-dry dressings to the left foot wound and  buttocks wound. She has been using Medihoney and silver alginate to the right heel wound. She currently denies systemic signs of infection. She is mainly bedbound or in a wheelchair. 1/10; patient presents for follow-up. Patient had her left second toenail removed by [name], podiatry on 12/21. Unfortunately she did not feel well and she was admitted to the hospital for sepsis. Her right heel wound  was thought to be infected and this was debrided in the OR by [name]. Culture and bone biopsy had no growth. She has been using Medihoney to all the wound beds except for the lefty buttocks wound for which she uses Dakin's wet-to-dry dressings. She has developed a pressure injury to the left heel now. This is despite having Prevalon boots. Although she is not wearing them today. She has completed 4 [name] of Augmentin and also a week of doxycycline for osteomyelitis of the left foot. Her left foot wound no longer probes to bone. Currently she denies signs of infection. 1/24; patient presents for follow-up. She has been using Medihoney and Hydrofera Blue to the wound beds except for this buttocks wound she has been using Dakin's wet-to-dry dressings. She has developed 2 new wounds 1 to the left heel and the other to the right medial ankle. She claims she is wearing her Prevalon boots all the time although she does not have them on today. She currently denies signs of infection. 2/21; patient presents for follow-up. She was recently hospitalized on 12/19/2022 for sepsis secondary to Enterococcus bacillus bacteremia. She was treated with IV antibiotics and has completed her discharge oral antibiotics. She had a CT scanning of the abdomen/pelvis without acute abnormalities. Husband is present with patient today. They have been using Dakin's wet-to-dry dressings to the sacral wound and hip wound and Hydrofera Blue and Medihoney to the feet wounds. They are not changing the dressings daily. It is unclear how often they actually change the dressings. She has been wearing her Prevalon boots at night but not during the day. She currently denies systemic signs of infection. 3/20; patient presents for follow-up. She again was in the hospital for severe sepsis on 2/24; MRIs at that time showed findings suspicious for osteomyelitis to the right hip. She is currently on Augmentin to complete 4 [name] of this. She is  following with ID. She has been using Dakin's wet-to-dry dressings to the sacrum and right hip however the solution is starting to cause discomfort. T the rest of the wound she has been using Hydrofera Blue and Medihoney. Patient o and son are not interested in doing palliative care or hospice. They state that they had discussions while in the hospital. 4/17; patient presents for follow-up. She has been using Vashe wet-to-dry dressings to the sacrum and right trochanter wound. She has been using Medihoney and Hydrofera Blue to the bilateral feet wounds. She has no issues or complaints today. She denies signs of infection. 5/1; patient developed a new wound to the right [name]. Son was present and states that there was a scab and he removed it creating a wound. He has been keeping the area covered. T the sacral and right trochanter wound they have been using Vashe wet-to-dry dressings and Medihoney and Hydrofera Blue to the o bilateral feet wounds. She denies signs of infection. 6/1; 1 month follow-up. She has a new area on the left anterior lower [name]. In the meantime the sacral ulcer is closed. She still has an extensive wound on the right hip with exposed  tendon over the greater trochanter. On the lower extremities the left anterior lower [name] wound is new right ankle left heel and right heel all look reasonably stable. We are using Vashe wet-to-dry on the hip Medihoney and Hydrofera Blue on the lower extremities. She is accompanied by her son I think who is doing the dressings. They do not have a pressure-relief surface at home but they do have a hospital bed 7/10; I have not seen this patient since 03/13/2023. She was scheduled to be seen on 5/29 however missed her appointment due to illness. She was seen on 6/5 by Dr. Leanord Hawking and she presents today for her follow-up. Unfortunately her wounds have declined since I last saw her. She has several new wounds including one to the left hip wound and one left  anterior lower [name]. The right hip wound has declined and has maggots with bone exposed. She has been using Vashe wet- to-dry dressings to Right hip wound and Medihoney and Hydrofera Blue to the The remaining wounds. Sacral wound remains closed. 7/24; patient presents for follow-up. At last clinic visit I recommended she go to the ED for potential admission, IV antibiotics and further imaging. She had MRI of the pelvis and left foot that showed osteomyelitis. Podiatry advised conservative management. She was also seen by infectious disease who discharged patient on 2 [name] of linezolid and 2 [name] of ciprofloxacin. Patient is at home and has home health. Palliative care was consulted during the stay and she is full code and has declined further outpatient palliative care and hospice services.. She currently denies systemic signs of infection. Overall there is improvement in the appearance of the wounds. There is healthier granulation tissue present. However this is overall poor prognosis case. She has Prevalon boots but it is unclear if she is using these. She does not have them on today. She follows up with infectious disease next week. 8/7; Patient's been using Vashe wet-to-dry dressing to all the wound beds except for the left hip she has been using Santyl. She followed up with infectious disease on 8/1. Currently she is on linezolid and ciprofloxacin. They recommended lab work to see if she needs extension of her oral antibiotics. She currently denies systemic signs of infection. 8/21; patient presents for follow-up. She has been using Vashe wet-to-dry dressings to all wounds except the left hip she has been using Santyl. All wounds have improved in size and appearance. She has healthy granulation tissue present to all wounds except the left hip still has nonviable tissue throughout. She states she has completed her oral antibiotics per ID. She currently denies systemic signs of infection. 9/4;  patient presents for follow-up. She has been using Vashe wet-to-dry dressings to all wounds except the left hip. T the left hip she has been using Santyl. o All wounds appear well-healing. She currently denies systemic signs of infection. 9/18; 2-week follow-up. This is a very disabled woman who is nonambulatory. 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 She has significant bilateral lower extremity pressure ulcers bilaterally. 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 She is not ambulatory. She has extensive wounds on both trochanters. The right has exposed bone in the left nonviable tissue. I aggressively debrided this the last time she was here. We have been using Santyl on the left hip and Vashe  wet-to-dry on the right hip. She has wounds on her bilateral lower legs extensively on the right medial foot first met head ankle and lower [name] area on the left lower [name] and left heel. All of these seem somewhat improved again using Vashe wet-to-dry. We have been attempting to get a level 3 surface or some form of offloading surface for the patient although having trouble with their insurance which apparently is [name] 10/16; patient is doing very well. The wounds on the lower extremity even the extensive areas seem to be a lot better with healthy granulation. We have been using the [facility] equivalent of Vashe wet-to-dry. There is areas over the lateral part of the greater trochanters. The area on the left looks a lot better we have been using Santyl with backing wet to dry gauze. We have been using Vashe wet-to-dry in the right greater trochanter. I think we are in line with changing the dressing next week to both hip areas [name], [name] ([id number]) [id number] Page 10 of 12 We are apparently finally making  some inroads in getting her a level 3 surface for her bed 10/23; patient continues to make nice improvement in the bilateral lower [name] wounds using the equivalent of Vashe wet-to-dry. I did plan to change this dressing but the wounds are making such a nice improvement I continued it. T oday I have changed the greater trochanter stage IV pressure ulcers to a Prisma and then hydrogel wet-to-dry packing with border foam change every 2 D. Really remarkable improvement credit to the family this looking after her 09-18-2023 upon evaluation today patient appears to be doing more poorly especially in regard to her heels and lower extremities where she has deep tissue injuries on both heels and she has new blisters that have opened up on her legs. This is due to the fact that she is not elevating her legs she is staying in her wheelchair sitting not moving around throughout the day and this is not the best thing for her to be honest. Fortunately I do not think she is infected right now but at the same time this is still concerning to me. 10-02-2023 upon evaluation today patient appears to be doing really about the same in regard to her wounds. There is not much improvement here unfortunately. I do not see any signs of active infection at this time which is good news. With that being said she still has significant wounds on both hips the left is better than the right she also has many wounds scattered over her legs as well as both heels. 10-23-2023 patient continues to have significant issues with her wounds in general. With that being said fortunately I do not see any signs of active infection at this time which is great news and in general I believe that we are making decent progress all things considered at this point. The heels are 1 exception she continues to have too much pressure to the heels unfortunately. With all that being said the patient is unfortunately still not using the offloading boots as  much as she should be. I explained to her that this is going be of utmost importance that she is can to see this heal and get better. She voiced understanding and her son states they will begin using them on a regular basis. 11/07/2023; this patient I remember from coverage in the fall of this year. She is a patient with multiple pressure ulcers. She also has chronic venous insufficiency in her  legs. She has eschared areas on her bilateral plantar heels wounds on both lower legs. She has areas over the right greater trochanter that still probes to bone, left greater trochanter. She has a new wound area on the right buttock which is probably a stage II. We are bort using Xeroform and bordered foam on most of the wounds on the lower extremities. Betadine Xeroform and gauze on the heels and Prisma as the primary dressing on the hips. Her legs are held in tight flexion contractures at the hips and knees. Objective Constitutional Sitting or standing Blood Pressure is within target range for patient.. Pulse regular and within target range for patient.Marland Kitchen Respirations regular, non-labored and within target range.. Temperature is normal and within the target range for the patient.Marland Kitchen appears in no distress. Vitals Time Taken: 3:29 PM, Weight: 98 lbs, Temperature: 98.3 F, Pulse: 109 bpm, Respiratory Rate: 18 breaths/min, Blood Pressure: 112/67 mmHg. General Notes: Wound exam; she has black eschar areas on both heels. Significant wounds on the right medial [name] right anterior [name] and to a lesser to [name]. She has the 2 areas on her right greater trochanter this still probes to bone the left greater trochanter which is measurably smaller and at least in my mind. Her peripheral pulses are somewhat difficult to feel she does not have uncontrolled edema Integumentary (Hair, Skin) Wound #11 status is Open. Original cause of wound was Pressure Injury. The date acquired was: 10/24/2022. The  wound has been in treatment 53 [name]. The wound is located on the Right Trochanter. The wound measures 1.2cm length x 2.2cm width x 0.7cm depth; 2.073cm^2 area and 1.451cm^3 volume. There is bone, muscle, tendon, Fat Layer (Subcutaneous Tissue), and fascia exposed. There is undermining starting at 12:00 and ending at 12:00 with a maximum distance of 1cm. There is a medium amount of serosanguineous drainage noted. The wound margin is well defined and not attached to the wound base. There is large (67-100%) red granulation within the wound bed. There is no necrotic tissue within the wound bed. Wound #13 status is Open. Original cause of wound was Pressure Injury. The date acquired was: 11/28/2022. The wound has been in treatment 48 [name]. The wound is located on the Left Calcaneus. The wound measures 3.2cm length x 2cm width x 0.1cm depth; 5.027cm^2 area and 0.503cm^3 volume. There is Fat Layer (Subcutaneous Tissue) exposed. There is a medium amount of serosanguineous drainage noted. The wound margin is flat and intact. There is medium (34-66%) pink granulation within the wound bed. There is a medium (34-66%) amount of necrotic tissue within the wound bed including Eschar and Adherent Slough. Wound #14 status is Open. Original cause of wound was Gradually Appeared. The date acquired was: 03/10/2023. The wound has been in treatment 34 [name]. The wound is located on the Right,Distal,Medial Lower [name]. The wound measures 7.5cm length x 4cm width x 0.1cm depth; 23.562cm^2 area and 2.356cm^3 volume. There is Fat Layer (Subcutaneous Tissue) exposed. There is a large amount of serosanguineous drainage noted. There is large (67-100%) red granulation within the wound bed. There is a small (1-33%) amount of necrotic tissue within the wound bed including Adherent Slough. Wound #15 status is Open. Original cause of wound was Pressure Injury. The date acquired was: 04/11/2023. The wound has been in treatment 29 [name].  The wound is located on the Left,Anterior Lower [name]. The wound measures 8.8cm length x 12cm width x 0.1cm depth; 82.938cm^2 area and 8.294cm^3 volume. There  is tendon and Fat Layer (Subcutaneous Tissue) exposed. There is a medium amount of serosanguineous drainage noted. There is no granulation within the wound bed. There is no necrotic tissue within the wound bed. Wound #16 status is Open. Original cause of wound was Pressure Injury. The date acquired was: 05/22/2023. The wound has been in treatment 24 [name]. The wound is located on the Left Trochanter. The wound measures 1.2cm length x 1cm width x 0.8cm depth; 0.942cm^2 area and 0.754cm^3 volume. There is Fat Layer (Subcutaneous Tissue) exposed. There is tunneling at 6:00 with a maximum distance of 1.6cm. There is a medium amount of serosanguineous drainage noted. There is a large (67-100%) amount of necrotic tissue within the wound bed including Eschar and Adherent Slough. Wound #18 status is Open. Original cause of wound was Pressure Injury. The date acquired was: 07/31/2023. The wound has been in treatment 14 [name]. The wound is located on the Right Metatarsal head first. The wound measures 1cm length x 1cm width x 0.1cm depth; 0.785cm^2 area and 0.079cm^3 volume. There is Fat Layer (Subcutaneous Tissue) exposed. There is a medium amount of serosanguineous drainage noted. There is medium (34-66%) red granulation within the wound bed. There is a medium (34-66%) amount of necrotic tissue within the wound bed including Eschar and Adherent Slough. Wound #19 status is Open. Original cause of wound was Blister. The date acquired was: 09/17/2023. The wound has been in treatment 7 [name]. The wound is located on the Right,Anterior Lower [name]. The wound measures 3cm length x 4.2cm width x 0.1cm depth; 9.896cm^2 area and 0.99cm^3 volume. There is Fat Layer (Subcutaneous Tissue) exposed. There is a medium amount of serosanguineous drainage noted. Wound #20  status is Open. Original cause of wound was Gradually Appeared. The date acquired was: 10/13/2023. The wound is located on the Right Gluteal fold. The wound measures 1cm length x 0.7cm width x 0.1cm depth; 0.55cm^2 area and 0.055cm^3 volume. There is Fat Layer (Subcutaneous Tissue) exposed. There is a medium amount of serosanguineous drainage noted. There is no granulation within the wound bed. There is a large (67-100%) amount of necrotic tissue within the wound bed including Adherent Slough. [name], [name] ([id number]) [id number] Page 11 of 12 Wound #6 status is Open. Original cause of wound was Pressure Injury. The date acquired was: 07/23/2022. The wound has been in treatment 58 [name]. The wound is located on the Right Calcaneus. The wound measures 6cm length x 3cm width x 0.1cm depth; 14.137cm^2 area and 1.414cm^3 volume. There is Fat Layer (Subcutaneous Tissue) exposed. There is a medium amount of serosanguineous drainage noted. The wound margin is distinct with the outline attached to the wound base. There is medium (34-66%) red, pink granulation within the wound bed. There is a medium (34-66%) amount of necrotic tissue within the wound bed including Eschar and Adherent Slough. 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 We have not changed  any of the dressings. Predominantly Xeroform and gauze in the lower legs. Prisma to the areas on her greater trochanters. 2. The patient continuously talks about wanting her legs and wounds to improve so she can walk. I told her that this is not going to happen because of the flexion contractures more than anything else. However I have reminded her that she has a very active cognitively intact mind and still has her hands. Perhaps using iPad or something like that would pass that time more for her. 3. She has been also followed by Dr. Rudene Anda of infectious disease. A picture of the right heel really looked fairly bad and red on 12/17. Culture grew abundant MRSA. I am not exactly sure where on the heel was cultured. She apparently put in an antibiotic but they have not yet picked it upo Linezolid Electronic Signature(s) Signed: 11/08/2023 12:26:56 PM By: Baltazar Najjar MD Entered By: Baltazar Najjar on 11/07/2023 13:34:27 -------------------------------------------------------------------------------- SuperBill Details Patient Name: Date of Service: [name], [name]. 11/07/2023 Medical Record Number: [id number] Patient Account Number: [id number] Date of Birth/Sex: Treating RN: 24-Sep-1941 (82 y.o. 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 I87.312 Chronic venous hypertension (idiopathic) with ulcer of left lower extremity Steinbach, Isidora [name] ([id number]) [id number] Page 12 of 12 I89.0 Lymphedema, not elsewhere classified E11.622 Type 2  diabetes mellitus with other skin ulcer L89.610 Pressure ulcer of right heel, unstageable L89.513 Pressure ulcer of right ankle, stage 3 L89.620 Pressure ulcer of left heel, unstageable M86.172 Other acute osteomyelitis, left ankle and foot L89.214 Pressure ulcer of right hip, stage 4 L89.220 Pressure ulcer of left hip, unstageable I50.32 Chronic diastolic (congestive) heart failure Z79.84 Long term (current) use of oral hypoglycemic drugs Facility Procedures : CPT4 Code: [id number] Description: 84166 - WOUND CARE VISIT-LEV 5 EST PT Modifier: Quantity: 1 Physician Procedures : CPT4 Code Description Modifier [id number] - WC PHYS LEVEL 3 - EST PT ICD-10 Diagnosis Description L97.822 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

## 2023-11-15 ENCOUNTER — Telehealth: Payer: Self-pay

## 2023-11-15 DIAGNOSIS — R197 Diarrhea, unspecified: Secondary | ICD-10-CM

## 2023-11-15 NOTE — Telephone Encounter (Signed)
 Called Miriana back, her son Christiane Ha [facility]) answered. Relayed provider's recommendation to try taking linezolid after eating. He will also pick up stool collection kit for C.diff testing at [facility] lab.   Sandie Ano, RN

## 2023-11-15 NOTE — Addendum Note (Signed)
 Addended by: Linna Hoff D on: 11/15/2023 11:37 AM   Modules accepted: Orders

## 2023-11-15 NOTE — Telephone Encounter (Signed)
 Patient called, reports she started having diarrhea on Wednesday 1/1. She started linezolid  on 12/26. Reports she has had at least two episodes of diarrhea a day. Did have an episode of vomiting around 12:30 AM today. Denies abdominal cramping. Feels like the food she eats goes right through her. Has been taking lomotil  since Wednesday, but feels like it's only helping a little bit.   She would like to know if provider has any additional recommendations.   [phone]  Izayah Miner D Dyanara Cozza, RN

## 2023-11-20 ENCOUNTER — Encounter: Payer: PPO | Attending: Physician Assistant | Admitting: Physician Assistant

## 2023-11-20 DIAGNOSIS — E11622 Type 2 diabetes mellitus with other skin ulcer: Secondary | ICD-10-CM | POA: Diagnosis not present

## 2023-11-20 DIAGNOSIS — L97822 Non-pressure chronic ulcer of other part of left lower leg with fat layer exposed: Secondary | ICD-10-CM | POA: Diagnosis not present

## 2023-11-20 DIAGNOSIS — I110 Hypertensive heart disease with heart failure: Secondary | ICD-10-CM | POA: Diagnosis not present

## 2023-11-20 DIAGNOSIS — Z7984 Long term (current) use of oral hypoglycemic drugs: Secondary | ICD-10-CM | POA: Diagnosis not present

## 2023-11-20 DIAGNOSIS — Z7401 Bed confinement status: Secondary | ICD-10-CM | POA: Diagnosis not present

## 2023-11-20 DIAGNOSIS — L89620 Pressure ulcer of left heel, unstageable: Secondary | ICD-10-CM | POA: Insufficient documentation

## 2023-11-20 DIAGNOSIS — S81801A Unspecified open wound, right lower leg, initial encounter: Secondary | ICD-10-CM | POA: Diagnosis not present

## 2023-11-20 DIAGNOSIS — E1151 Type 2 diabetes mellitus with diabetic peripheral angiopathy without gangrene: Secondary | ICD-10-CM | POA: Insufficient documentation

## 2023-11-20 DIAGNOSIS — Z853 Personal history of malignant neoplasm of breast: Secondary | ICD-10-CM | POA: Insufficient documentation

## 2023-11-20 DIAGNOSIS — M86172 Other acute osteomyelitis, left ankle and foot: Secondary | ICD-10-CM | POA: Insufficient documentation

## 2023-11-20 DIAGNOSIS — E1169 Type 2 diabetes mellitus with other specified complication: Secondary | ICD-10-CM | POA: Diagnosis not present

## 2023-11-20 DIAGNOSIS — I890 Lymphedema, not elsewhere classified: Secondary | ICD-10-CM | POA: Insufficient documentation

## 2023-11-20 DIAGNOSIS — L97812 Non-pressure chronic ulcer of other part of right lower leg with fat layer exposed: Secondary | ICD-10-CM | POA: Insufficient documentation

## 2023-11-20 DIAGNOSIS — I87312 Chronic venous hypertension (idiopathic) with ulcer of left lower extremity: Secondary | ICD-10-CM | POA: Insufficient documentation

## 2023-11-20 DIAGNOSIS — L89513 Pressure ulcer of right ankle, stage 3: Secondary | ICD-10-CM | POA: Insufficient documentation

## 2023-11-20 DIAGNOSIS — L89214 Pressure ulcer of right hip, stage 4: Secondary | ICD-10-CM | POA: Diagnosis not present

## 2023-11-20 DIAGNOSIS — L89610 Pressure ulcer of right heel, unstageable: Secondary | ICD-10-CM | POA: Insufficient documentation

## 2023-11-20 DIAGNOSIS — L89613 Pressure ulcer of right heel, stage 3: Secondary | ICD-10-CM | POA: Diagnosis not present

## 2023-11-20 DIAGNOSIS — E1140 Type 2 diabetes mellitus with diabetic neuropathy, unspecified: Secondary | ICD-10-CM | POA: Diagnosis not present

## 2023-11-20 DIAGNOSIS — Z993 Dependence on wheelchair: Secondary | ICD-10-CM | POA: Diagnosis not present

## 2023-11-20 DIAGNOSIS — L89220 Pressure ulcer of left hip, unstageable: Secondary | ICD-10-CM | POA: Insufficient documentation

## 2023-11-20 DIAGNOSIS — I5032 Chronic diastolic (congestive) heart failure: Secondary | ICD-10-CM | POA: Insufficient documentation

## 2023-11-20 NOTE — Progress Notes (Addendum)
 Bergquist, [name] ([id number]) [id number] Page 1 of 18 Visit Report for 11/20/2023 Arrival Information Details Patient Name: Date of Service: MCCO [name], [place] [name]. 11/20/2023 3:30 PM Medical Record Number: [id number] Patient Account Number: [id number] Date of Birth/Sex: Treating RN: 29-Aug-1941 (83 y.o. [name] Primary Care Maesyn Frisinger: Sadie Manna Other Clinician: Purcell Sniff Referring Saralynn Langhorst: Treating Aliena Ghrist/Extender: Bethena Andre Sadie Manna Weeks in Treatment: 35 Visit Information History Since Last Visit All ordered tests and consults were completed: No Patient Arrived: Wheel Chair Added or deleted any medications: No Arrival Time: 16:13 Any new allergies or adverse reactions: No Transfer Assistance: EasyPivot Patient Lift Had a fall or experienced change in No Patient Identification Verified: Yes activities of daily living that may affect Secondary Verification Process Completed: Yes risk of falls: Patient Requires Transmission-Based Precautions: No Signs or symptoms of abuse/neglect since last visito No Patient Has Alerts: Yes Hospitalized since last visit: No Patient Alerts: DM II Implantable device outside of the clinic excluding No ABI R 1.30 TBI 1.12 cellular tissue based products placed in the center ABI [name] 1.27 TBI 1.21 since last visit: Has Dressing in Place as Prescribed: Yes Pain Present Now: Yes Electronic Signature(s) Signed: 11/20/2023 5:49:46 PM By: Purcell Sniff Entered By: Purcell Sniff on 11/20/2023 16:21:24 -------------------------------------------------------------------------------- Clinic Level of Care Assessment Details Patient Name: Date of Service: MCCO [name], DIA [name]. 11/20/2023 3:30 PM Medical Record Number: [id number] Patient Account Number: [id number] Date of Birth/Sex: Treating RN: May 03, 1941 (83 y.o. [name] Primary Care Mort Smelser: Sadie Manna Other Clinician: Purcell Sniff Referring Bethany Hirt: Treating Yeraldine Forney/Extender: Bethena Andre Sadie Manna Weeks in Treatment: 60 Clinic Level of Care Assessment Items TOOL 1 Quantity Score []  - 0 Use when EandM and Procedure is performed on INITIAL visit ASSESSMENTS - Nursing Assessment / Reassessment []  - 0 General Physical Exam (combine w/ comprehensive assessment (listed just below) when performed on new pt. evals) []  - 0 Comprehensive Assessment (HX, ROS, Risk Assessments, Wounds Hx, etc.) Reihl, Camille [name] ([id number]) [phone].pdf Page 2 of 18 ASSESSMENTS - Wound and Skin Assessment / Reassessment []  - 0 Dermatologic / Skin Assessment (not related to wound area) ASSESSMENTS - Ostomy and/or Continence Assessment and Care []  - 0 Incontinence Assessment and Management []  - 0 Ostomy Care Assessment and Management (repouching, etc.) PROCESS - Coordination of Care []  - 0 Simple Patient / Family Education for ongoing care []  - 0 Complex (extensive) Patient / Family Education for ongoing care []  - 0 Staff obtains Chiropractor, Records, T Results / Process Orders est []  - 0 Staff telephones HHA, Nursing Homes / Clarify orders / etc []  - 0 Routine Transfer to another Facility (non-emergent condition) []  - 0 Routine Hospital Admission (non-emergent condition) []  - 0 New Admissions / Manufacturing Engineer / Ordering NPWT Apligraf, etc. , []  - 0 Emergency Hospital Admission (emergent condition) PROCESS - Special Needs []  - 0 Pediatric / Minor Patient Management []  - 0 Isolation Patient Management []  - 0 Hearing / Language / Visual special needs []  - 0 Assessment of Community assistance (transportation, D/C planning, etc.) []  - 0 Additional assistance / Altered mentation []  - 0 Support Surface(s) Assessment (bed, cushion, seat, etc.) INTERVENTIONS - Miscellaneous []  - 0 External ear exam []  - 0 Patient Transfer (multiple staff / Nurse, Adult / Similar devices) []   - 0 Simple Staple / Suture removal (25 or less) []  - 0 Complex Staple / Suture removal (26 or more) []  - 0 Hypo/Hyperglycemic Management (do not check if billed separately) []  -  0 Ankle / Brachial Index (ABI) - do not check if billed separately Has the patient been seen at the hospital within the last three years: Yes Total Score: 0 Level Of Care: ____ Electronic Signature(s) Signed: 11/20/2023 5:49:46 PM By: Purcell Sniff Entered By: Purcell Sniff on 11/20/2023 17:05:30 -------------------------------------------------------------------------------- Encounter Discharge Information Details Patient Name: Date of Service: MCCO [name], DIA [name]. 11/20/2023 3:30 PM Medical Record Number: [id number] Patient Account Number: [id number] Date of Birth/Sex: Treating RN: December 07, 1940 (83 y.o. [name] Primary Care Amiria Orrison: Sadie Manna Other Clinician: Purcell Sniff Referring Earmon Sherrow: Treating Januel Doolan/Extender: Bethena Andre Sadie Manna Sebree, Naoko [name] ([id number]) [id number] Page 3 of 18 Weeks in Treatment: 15 Encounter Discharge Information Items Post Procedure Vitals Discharge Condition: Stable Temperature (F): 97.5 Ambulatory Status: Wheelchair Pulse (bpm): 92 Discharge Destination: Home Respiratory Rate (breaths/min): 18 Transportation: Private Auto Blood Pressure (mmHg): 120/77 Accompanied By: son Schedule Follow-up Appointment: Yes Clinical Summary of Care: Electronic Signature(s) Signed: 11/20/2023 5:49:46 PM By: Purcell Sniff Entered By: Purcell Sniff on 11/20/2023 17:42:26 -------------------------------------------------------------------------------- Lower Extremity Assessment Details Patient Name: Date of Service: MCCO [name], DIA [name]. 11/20/2023 3:30 PM Medical Record Number: [id number] Patient Account Number: [id number] Date of Birth/Sex: Treating RN: 03-21-1941 (83 y.o. [name] Primary Care Avina Eberle: Sadie Manna Other Clinician: Purcell Sniff Referring Liddy Deam: Treating Beuna Bolding/Extender: Bethena Andre Sadie, Vishwanath Weeks in Treatment: 60 Electronic Signature(s) Signed: 11/20/2023 5:49:46 PM By: Purcell Sniff Signed: 11/22/2023 8:27:37 AM By: Alverta Sailors RN Entered By: Purcell Sniff on 11/20/2023 16:47:45 -------------------------------------------------------------------------------- Multi Wound Chart Details Patient Name: Date of Service: MCCO [name], DIA [name]. 11/20/2023 3:30 PM Medical Record Number: [id number] Patient Account Number: [id number] Date of Birth/Sex: Treating RN: 1941/01/20 (83 y.o. [name] Primary Care Rubel Heckard: Sadie Manna Other Clinician: Purcell Sniff Referring Meshilem Machuca: Treating Zaylan Kissoon/Extender: Bethena Andre Sadie Manna Weeks in Treatment: 60 Vital Signs Height(in): Pulse(bpm): 92 Weight(lbs): 98 Blood Pressure(mmHg): 120/77 Body Mass Index(BMI): Temperature(F): 97.5 Respiratory Rate(breaths/min): 18 [Vallie, Devlynn [name] ([id number]):Photos: Wound Location: Wounding Event: Primary Etiology: Comorbid History: Date Acquired: Weeks of Treatment: Wound Status: Wound Recurrence: Clustered Wound: Measurements [name] x W x D (cm) A (cm) : rea Volume (cm) : %  Reduction in A rea: % Reduction in Volume: Starting Position 1 (o'clock): Ending Position 1 (o'clock): Maximum Distance 1 (cm): Tunneling: Undermining: Classification: Exudate A mount: Exudate Type: Exudate Color: Wound Margin: Granulation A mount:  Granulation Quality: Necrotic A mount: Necrotic Tissue: Exposed Structures: Epithelialization:] [id number].pdf Page 4 of 18:11 13 14  No Photos No Photos No Photos Right Trochanter Left Calcaneus Right, Distal, Medial Lower Leg  Pressure Injury Pressure Injury Gradually Appeared Pressure Ulcer Pressure Ulcer Diabetic Wound/Ulcer of the Lower Extremity Cataracts, Lymphedema, Cataracts, Lymphedema, Cataracts, Lymphedema,  Hypertension, Peripheral Arterial Hypertension, Peripheral  Arterial Hypertension, Peripheral Arterial Disease, Peripheral Venous Disease, Disease, Peripheral Venous Disease, Disease, Peripheral Venous Disease, Type II Diabetes, Osteoarthritis, Type II Diabetes, Osteoarthritis, Type II Diabetes, Osteoarthritis,  Neuropathy Neuropathy Neuropathy 10/24/2022 11/28/2022 03/10/2023 55 50 36 Open Open Open No No No No No Yes 2.3x1.5x0.2 2.5x3.5x0.1 7.2x3x0.1 2.71 6.872 16.965 0.542 0.687 1.696 -30.70% -98.80% -208.60% -161.80% -98.60% -208.40% 1 11 2.8 N/A N/A N/A Yes  N/A N/A Category/Stage IV Category/Stage III Grade 2 Medium Medium Large Serosanguineous Serosanguineous Serosanguineous red, brown red, brown red, brown Well defined, not attached Flat and Intact N/A Large (67-100%) Medium (34-66%) Large (67-100%) Red  Pink Red None Present (0%) Medium (34-66%) Small (1-33%) N/A Eschar, Adherent Slough Adherent Slough Fascia: Yes Fat Layer (Subcutaneous Tissue):  Yes Fat Layer (Subcutaneous Tissue): Yes Fat Layer (Subcutaneous Tissue): Yes Fascia: No Fascia: No Tendon:  Yes Tendon: No Tendon: No Muscle: Yes Muscle: No Muscle: No Bone: Yes Joint: No Joint: No Joint: No Bone: No Bone: No None None None] Wound Number: 15 16 18  Photos: No Photos No Photos No Photos Left, Anterior Lower Leg Left Trochanter Right Metatarsal head first Wound Location: Pressure Injury Pressure Injury Pressure Injury Wounding Event: Pressure Ulcer Pressure Ulcer Pressure Ulcer Primary Etiology: Cataracts, Lymphedema, Cataracts, Lymphedema, N/A Comorbid History: Hypertension, Peripheral Arterial Hypertension, Peripheral Arterial Disease, Peripheral Venous Disease, Disease, Peripheral Venous Disease, Type II Diabetes, Osteoarthritis, Type II Diabetes, Osteoarthritis, Neuropathy Neuropathy 04/11/2023 05/22/2023 07/31/2023 Date Acquired: 31 26 16  Weeks of Treatment: Open Open Open Wound Status: No No No Wound Recurrence: Yes No  No Clustered Wound: 9.4x10x0.1 0.8x0.4x0.7 1x1.2x0.1 Measurements [name] x W x D (cm) 73.827 0.251 0.942 A (cm) : rea 7.383 0.176 0.094 Volume (cm) : -229.10% 92.30% -26.30% % Reduction in A rea: -229.20% 45.80% -25.30% % Reduction in Volume: 6 Position 1 (o'clock): 1.7 Maximum Distance 1 (cm): N/A Yes N/A Tunneling: N/A N/A N/A Undermining: Category/Stage IV Unstageable/Unclassified Category/Stage II Classification: Medium Medium Medium Exudate A mount: Serosanguineous Serosanguineous Serosanguineous Exudate Type: red, brown red, brown red, brown Exudate Color: N/A N/A N/A Wound Margin: None Present (0%) N/A N/A Granulation A mount: N/A N/A N/A Granulation Quality: None Present (0%) N/A N/A Necrotic A mount: N/A Eschar, Adherent Slough N/A Necrotic Tissue: Fat Layer (Subcutaneous Tissue): Yes Fat Layer (Subcutaneous Tissue): Yes N/A Exposed Structures: Tendon: Yes Fascia: No Fascia: No Tendon: No Muscle: No Muscle: No Joint: No Joint: No Bone: No Bone: No Large (67-100%) N/A N/A Epithelialization: Wound Number: [id number] 20 6  Dunn, Fredi [name] ([id number]) [id number] Page 5 of 18 Photos: No Photos No Photos No Photos Right, Anterior Lower Leg Right Gluteal fold Right Calcaneus Wound Location: Blister Gradually Appeared Pressure Injury Wounding Event: Venous Leg Ulcer Pressure Ulcer Pressure Ulcer Primary Etiology: N/A N/A N/A Comorbid History: 09/17/2023 10/13/2023 07/23/2022 Date Acquired: 9 1 60 Weeks of Treatment: Open Open Open Wound Status: No No No Wound Recurrence: Yes No No Clustered Wound: 2.3x1.2x0.1 1x0.9x0.1 6.4x3.2x0.1 Measurements [name] x W x D (cm) 2.168 0.707 16.085 A (cm) : rea 0.217 0.071 1.608 Volume (cm) : 69.30% -28.50% -70.70% % Reduction in A rea: 69.30% -29.10% 43.10% % Reduction in Volume: N/A N/A N/A Tunneling: N/A N/A N/A Undermining: Full Thickness Without Exposed Unstageable/Unclassified  Category/Stage III Classification: Support Structures Medium Medium Medium Exudate A mount: Serosanguineous Serosanguineous Serosanguineous Exudate Type: red, brown red, brown red, brown Exudate Color: N/A N/A N/A Wound Margin: N/A N/A N/A Granulation A mount: N/A N/A N/A Granulation Quality: N/A N/A N/A Necrotic A mount: N/A N/A N/A Necrotic Tissue: N/A N/A N/A Exposed Structures: N/A N/A N/A Epithelialization: Treatment Notes Electronic Signature(s) Signed: 11/20/2023 5:49:46 PM By: Purcell Sniff Entered By: Purcell Sniff on 11/20/2023 16:47:53 -------------------------------------------------------------------------------- Multi-Disciplinary Care Plan Details Patient Name: Date of Service: MCCO [name], DIA [name]. 11/20/2023 3:30 PM Medical Record Number: [id number] Patient Account Number: [id number] Date of Birth/Sex: Treating RN: [birth year] (83 y.o. [name] Primary Care Aquarius Latouche: Sadie Manna Other Clinician: Purcell Sniff Referring Braedin Millhouse: Treating Tellis Spivak/Extender: Bethena Andre Sadie Manna Weeks in Treatment: 77 Active Inactive Electronic Signature(s) Signed: 11/20/2023 5:49:46 PM By: Purcell Sniff Signed: 11/22/2023 8:27:37 AM By: Alverta Sailors RN Entered By: Purcell Sniff on 11/20/2023 17:05:57 Pennie, LAWRNCE CROME ([id number]) [id number] Page 6 of 18 -------------------------------------------------------------------------------- Pain Assessment Details Patient Name:  Date of Service: MCCO [name], DIA [name]. 11/20/2023 3:30 PM Medical Record Number: [id number] Patient Account Number: [id number] Date of Birth/Sex: Treating RN: 21-Apr-1941 (83 y.o. [name] Primary Care Tadan Shill: Sadie Manna Other Clinician: Purcell Sniff Referring Lucas Winograd: Treating Meesha Sek/Extender: Bethena Andre Sadie Manna Weeks in Treatment: 75 Active Problems Location of Pain Severity and Description of Pain Patient Has Paino  Yes Site Locations Pain Location: Pain in Ulcers Duration of the Pain. Constant / Intermittento Constant Rate the pain. Current Pain Level: 8 Character of Pain Describe the Pain: Burning, Other: stinging Pain Management and Medication Current Pain Management: Medication: Yes Cold Application: No Rest: No Massage: No Activity: No T.E.N.S.: No Heat Application: No Leg drop or elevation: No Is the Current Pain Management Adequate: Inadequate How does your wound impact your activities of daily livingo Sleep: No Bathing: No Appetite: No Relationship With Others: No Bladder Continence: No Emotions: No Bowel Continence: No Work: No Toileting: No Drive: No Dressing: No Hobbies: No Electronic Signature(s) Signed: 11/20/2023 5:49:46 PM By: Purcell Sniff Signed: 11/22/2023 8:27:37 AM By: Alverta Sailors RN Entered By: Purcell Sniff on 11/20/2023 16:44:14 Vidaurri, Leyna [name] ([id number]) [id number] Page 7 of 18 -------------------------------------------------------------------------------- Patient/Caregiver Education Details Patient Name: Date of Service: MCCO [name], DIA [name]. 1/8/2025andnbsp3:30 PM Medical Record Number: [id number] Patient Account Number: [id number] Date of Birth/Gender: Treating RN: [birth year] (83 y.o. [name] Primary Care Physician: Sadie Manna Other Clinician: Purcell Sniff Referring Physician: Treating Physician/Extender: Bethena Andre Sadie Manna Weeks in Treatment: 39 Education Assessment Education Provided To: Patient and Caregiver Education Topics Provided Wound/Skin Impairment: Handouts: Other: continue wound care as directed Methods: Explain/Verbal Responses: State content correctly Electronic Signature(s) Signed: 11/20/2023 5:49:46 PM By: Purcell Sniff Entered By: Purcell Sniff on 11/20/2023 17:06:33 -------------------------------------------------------------------------------- Wound  Assessment Details Patient Name: Date of Service: MCCO [name], DIA [name]. 11/20/2023 3:30 PM Medical Record Number: [id number] Patient Account Number: [id number] Date of Birth/Sex: Treating RN: 09/04/1941 (83 y.o. [name] Primary Care Evellyn Tuff: Sadie Manna Other Clinician: Purcell Sniff Referring Criston Chancellor: Treating Kinzee Happel/Extender: Bethena Andre Sadie Manna Weeks in Treatment: 60 Wound Status Wound Number: 11 Primary Pressure Ulcer Etiology: Wound Location: Right Trochanter Wound Open Wounding Event: Pressure Injury Status: Date Acquired: 10/24/2022 Comorbid Cataracts, Lymphedema, Hypertension, Peripheral Arterial Disease, Weeks Of Treatment: 55 History: Peripheral Venous Disease, Type II Diabetes, Osteoarthritis, Clustered Wound: No Neuropathy Wound Measurements Length: (cm) 2.3 Width: (cm) 1.5 Depth: (cm) 0.2 Area: (cm) 2.7 Ruttan, Cyerra [name] ([id number]) Volume: (cm) % Reduction in Area: -30.7% % Reduction in Volume: -161.8% Epithelialization: None 1 Undermining: Yes [id number] Page 8 of 18 0.542 Starting Position (o'clock): 1 Ending Position (o'clock): 11 Maximum Distance: (cm) 2.8 Wound Description Classification: Category/Stage IV Wound Margin: Well defined, not attached Exudate Amount: Medium Exudate Type: Serosanguineous Exudate Color: red, brown Foul Odor After Cleansing: No Slough/Fibrino Yes Wound Bed Granulation Amount: Large (67-100%) Exposed Structure Granulation Quality: Red Fascia Exposed: Yes Necrotic Amount: None Present (0%) Fat Layer (Subcutaneous Tissue) Exposed: Yes Tendon Exposed: Yes Muscle Exposed: Yes Necrosis of Muscle: No Joint Exposed: No Bone Exposed: Yes Treatment Notes Wound #11 (Trochanter) Wound Laterality: Right Cleanser Vashe 5.8 (oz) Discharge Instruction: Use as directed for cleaning wound Peri-Wound Care Topical Primary Dressing Prisma 4.34 (in) Discharge Instruction:  pack into tunnel and wound Secondary Dressing Gauze Discharge Instruction: moisten with Vashe and place over collagen (BORDER) Zetuvit Plus SILICONE BORDER Dressing 5x5 (in/in) Discharge Instruction: Please do not put silicone bordered dressings under wraps. Use non-bordered dressing only. Secured With  Compression Wrap Compression Stockings Add-Ons Electronic Signature(s) Signed: 11/20/2023 5:49:46 PM By: Purcell Sniff Signed: 11/22/2023 8:27:37 AM By: Alverta Sailors RN Entered By: Purcell Sniff on 11/20/2023 16:45:29 -------------------------------------------------------------------------------- Wound Assessment Details Patient Name: Date of Service: MCCO [name], DIA [name]. 11/20/2023 3:30 PM Medical Record Number: [id number] Patient Account Number: [id number] Date of Birth/Sex: Treating RN: 02/04/1941 (83 y.o. Lorma, Heater, Kiala [name] ([id number]) [id number] Page 9 of 18 Primary Care Stclair Szymborski: Sadie Manna Other Clinician: Purcell Sniff Referring Katena Petitjean: Treating Chalice Philbert/Extender: Bethena Andre Sadie, Vishwanath Weeks in Treatment: 60 Wound Status Wound Number: 13 Primary Pressure Ulcer Etiology: Wound Location: Left Calcaneus Wound Open Wounding Event: Pressure Injury Status: Date Acquired: 11/28/2022 Comorbid Cataracts, Lymphedema, Hypertension, Peripheral Arterial Disease, Weeks Of Treatment: 50 History: Peripheral Venous Disease, Type II Diabetes, Osteoarthritis, Clustered Wound: No Neuropathy Wound Measurements Length: (cm) 2.5 Width: (cm) 3.5 Depth: (cm) 0.1 Area: (cm) 6.872 Volume: (cm) 0.687 % Reduction in Area: -98.8% % Reduction in Volume: -98.6% Epithelialization: None Wound Description Classification: Category/Stage III Wound Margin: Flat and Intact Exudate Amount: Medium Exudate Type: Serosanguineous Exudate Color: red, brown Foul Odor After Cleansing: No Slough/Fibrino Yes Wound Bed Granulation Amount:  Medium (34-66%) Exposed Structure Granulation Quality: Pink Fascia Exposed: No Necrotic Amount: Medium (34-66%) Fat Layer (Subcutaneous Tissue) Exposed: Yes Necrotic Quality: Eschar, Adherent Slough Tendon Exposed: No Muscle Exposed: No Joint Exposed: No Bone Exposed: No Treatment Notes Wound #13 (Calcaneus) Wound Laterality: Left Cleanser Vashe 5.8 (oz) Discharge Instruction: damp to dry daily Peri-Wound Care Topical Betadine  Discharge Instruction: Apply betadine  to heel Primary Dressing Secondary Dressing ABD Pad 5x9 (in/in) Discharge Instruction: Cover with ABD pad Kerlix 4.5 x 4.1 (in/yd) Discharge Instruction: Apply Kerlix 4.5 x 4.1 (in/yd) as instructed Secured With ACE WRAP - 1M ACE Elastic Bandage With VELCRO Brand Closure, 4 (in) Compression Wrap Compression Stockings Add-Ons Electronic Signature(s) Signed: 11/20/2023 5:49:46 PM By: Purcell Sniff Signed: 11/22/2023 8:27:37 AM By: Alverta Sailors RN Entered By: Purcell Sniff on 11/20/2023 16:44:39 Naser, Yoshi [name] ([id number]) [id number] Page 10 of 18 -------------------------------------------------------------------------------- Wound Assessment Details Patient Name: Date of Service: MCCO [name], DIA [name]. 11/20/2023 3:30 PM Medical Record Number: [id number] Patient Account Number: [id number] Date of Birth/Sex: Treating RN: November 10, 1941 (83 y.o. [name] Primary Care Kerryann Allaire: Sadie Manna Other Clinician: Purcell Sniff Referring Tifini Reeder: Treating Alleya Demeter/Extender: Bethena Andre Sadie Manna Weeks in Treatment: 60 Wound Status Wound Number: 14 Primary Diabetic Wound/Ulcer of the Lower Extremity Etiology: Wound Location: Right, Distal, Medial Lower Leg Wound Open Wounding Event: Gradually Appeared Status: Date Acquired: 03/10/2023 Comorbid Cataracts, Lymphedema, Hypertension, Peripheral Arterial Disease, Weeks Of Treatment: 36 History: Peripheral Venous Disease,  Type II Diabetes, Osteoarthritis, Clustered Wound: Yes Neuropathy Wound Measurements Length: (cm) 7.2 Width: (cm) 3 Depth: (cm) 0.1 Area: (cm) 16.965 Volume: (cm) 1.696 % Reduction in Area: -208.6% % Reduction in Volume: -208.4% Epithelialization: None Wound Description Classification: Grade 2 Exudate Amount: Large Exudate Type: Serosanguineous Exudate Color: red, brown Foul Odor After Cleansing: No Slough/Fibrino Yes Wound Bed Granulation Amount: Large (67-100%) Exposed Structure Granulation Quality: Red Fascia Exposed: No Necrotic Amount: Small (1-33%) Fat Layer (Subcutaneous Tissue) Exposed: Yes Necrotic Quality: Adherent Slough Tendon Exposed: No Muscle Exposed: No Joint Exposed: No Bone Exposed: No Treatment Notes Wound #14 (Lower Leg) Wound Laterality: Right, Medial, Distal Cleanser Vashe 5.8 (oz) Discharge Instruction: damp to dry daily Peri-Wound Care Topical Primary Dressing Xeroform 5x9-HBD (in/in) Discharge Instruction: Apply Xeroform 5x9-HBD (in/in) as directed Secondary Dressing ABD Pad 5x9 (in/in) Discharge Instruction: Cover with ABD pad  Kerlix 4.5 x 4.1 (in/yd) Discharge Instruction: Apply Kerlix 4.5 x 4.1 (in/yd) as instructed Secured With Thoreson, Iszabella [name] ([id number]) [phone].pdf Page 11 of 18 ACE WRAP - 59M ACE Elastic Bandage With VELCRO Brand Closure, 4 (in) Compression Wrap Compression Stockings Add-Ons Electronic Signature(s) Signed: 11/20/2023 5:49:46 PM By: Purcell Sniff Signed: 11/22/2023 8:27:37 AM By: Alverta Sailors RN Entered By: Purcell Sniff on 11/20/2023 16:47:26 -------------------------------------------------------------------------------- Wound Assessment Details Patient Name: Date of Service: MCCO [name], DIA [name]. 11/20/2023 3:30 PM Medical Record Number: [id number] Patient Account Number: [id number] Date of Birth/Sex: Treating RN: Dec 01, 1940 (83 y.o. [name] Primary Care Francelia Mclaren: Sadie Manna Other Clinician: Purcell Sniff Referring Joal Eakle: Treating Josselyn Harkins/Extender: Bethena Andre Sadie Manna Weeks in Treatment: 60 Wound Status Wound Number: 15 Primary Pressure Ulcer Etiology: Wound Location: Left, Anterior Lower Leg Wound Open Wounding Event: Pressure Injury Status: Date Acquired: 04/11/2023 Comorbid Cataracts, Lymphedema, Hypertension, Peripheral Arterial Disease, Weeks Of Treatment: 31 History: Peripheral Venous Disease, Type II Diabetes, Osteoarthritis, Clustered Wound: Yes Neuropathy Wound Measurements Length: (cm) 9.4 Width: (cm) 10 Depth: (cm) 0.1 Area: (cm) 73.827 Volume: (cm) 7.383 % Reduction in Area: -229.1% % Reduction in Volume: -229.2% Epithelialization: Large (67-100%) Wound Description Classification: Category/Stage IV Exudate Amount: Medium Exudate Type: Serosanguineous Exudate Color: red, brown Foul Odor After Cleansing: No Slough/Fibrino Yes Wound Bed Granulation Amount: None Present (0%) Exposed Structure Necrotic Amount: None Present (0%) Fascia Exposed: No Fat Layer (Subcutaneous Tissue) Exposed: Yes Tendon Exposed: Yes Muscle Exposed: No Joint Exposed: No Bone Exposed: No Treatment Notes Wound #15 (Lower Leg) Wound Laterality: Left, Anterior Cleanser Vashe 5.8 (oz) Discharge Instruction: damp to dry daily Peri-Wound Care KALIANNA, VERBEKE [name] ([id number]) [phone].pdf Page 12 of 18 Topical Primary Dressing Xeroform 5x9-HBD (in/in) Discharge Instruction: Apply Xeroform 5x9-HBD (in/in) as directed Secondary Dressing ABD Pad 5x9 (in/in) Discharge Instruction: Cover with ABD pad Kerlix 4.5 x 4.1 (in/yd) Discharge Instruction: Apply Kerlix 4.5 x 4.1 (in/yd) as instructed Secured With ACE WRAP - 59M ACE Elastic Bandage With VELCRO Brand Closure, 4 (in) Compression Wrap Compression Stockings Add-Ons Electronic Signature(s) Signed: 11/20/2023 5:49:46 PM By: Purcell Sniff Signed:  11/22/2023 8:27:37 AM By: Alverta Sailors RN Entered By: Purcell Sniff on 11/20/2023 16:47:33 -------------------------------------------------------------------------------- Wound Assessment Details Patient Name: Date of Service: MCCO [name], DIA [name]. 11/20/2023 3:30 PM Medical Record Number: [id number] Patient Account Number: [id number] Date of Birth/Sex: Treating RN: 10/28/1941 (83 y.o. [name] Primary Care Haevyn Ury: Sadie Manna Other Clinician: Purcell Sniff Referring Amal Renbarger: Treating Tykesha Konicki/Extender: Bethena Andre Sadie Manna Weeks in Treatment: 60 Wound Status Wound Number: 16 Primary Pressure Ulcer Etiology: Wound Location: Left Trochanter Wound Open Wounding Event: Pressure Injury Status: Date Acquired: 05/22/2023 Comorbid Cataracts, Lymphedema, Hypertension, Peripheral Arterial Disease, Weeks Of Treatment: 26 History: Peripheral Venous Disease, Type II Diabetes, Osteoarthritis, Clustered Wound: No Neuropathy Wound Measurements Length: (cm) 0.8 Width: (cm) 0.4 Depth: (cm) 0.7 Area: (cm) 0.251 Volume: (cm) 0.176 % Reduction in Area: 92.3% % Reduction in Volume: 45.8% Tunneling: Yes Position (o'clock): 6 Maximum Distance: (cm) 1.7 Wound Description Classification: Unstageable/Unclassified Exudate Amount: Medium Exudate Type: Serosanguineous Exudate Color: red, brown Foul Odor After Cleansing: No Slough/Fibrino Yes Wound Bed Necrotic Amount: Large (67-100%) Exposed Structure Necrotic Quality: Eschar, Adherent Slough Fascia Exposed: No Fat Layer (Subcutaneous Tissue) Exposed: Yes Verry, Yasaman [name] ([id number]) [id number].pdf Page 13 of 18 Tendon Exposed: No Muscle Exposed: No Joint Exposed: No Bone Exposed: No Treatment Notes Wound #16 (Trochanter) Wound Laterality: Left Cleanser Vashe 5.8 (oz) Discharge Instruction: Use as directed for cleaning  wound Peri-Wound Care Topical Primary Dressing Prisma 4.34  (in) Discharge Instruction: pack into tunnel and wound Secondary Dressing Gauze Discharge Instruction: moisten with Vashe and place over collagen (BORDER) Zetuvit Plus SILICONE BORDER Dressing 5x5 (in/in) Discharge Instruction: Please do not put silicone bordered dressings under wraps. Use non-bordered dressing only. Secured With Compression Wrap Compression Stockings Facilities Manager) Signed: 11/20/2023 5:49:46 PM By: Purcell Sniff Signed: 11/22/2023 8:27:37 AM By: Alverta Sailors RN Entered By: Purcell Sniff on 11/20/2023 16:46:16 -------------------------------------------------------------------------------- Wound Assessment Details Patient Name: Date of Service: MCCO [name], DIA [name]. 11/20/2023 3:30 PM Medical Record Number: [id number] Patient Account Number: [id number] Date of Birth/Sex: Treating RN: June 28, 1941 (83 y.o. [name] Primary Care Tanvi Gatling: Sadie Manna Other Clinician: Purcell Sniff Referring Aaradhya Kysar: Treating Kayode Petion/Extender: Bethena Andre Sadie, Vishwanath Weeks in Treatment: 60 Wound Status Wound Number: 18 Primary Etiology: Pressure Ulcer Wound Location: Right Metatarsal head first Wound Status: Open Wounding Event: Pressure Injury Date Acquired: 07/31/2023 Weeks Of Treatment: 16 Clustered Wound: No Wound Measurements Length: (cm) 1 Width: (cm) 1.2 Depth: (cm) 0.1 Area: (cm) 0.942 Acord, Marymargaret [name] ([id number]) Volume: (cm) 0.094 % Reduction in Area: -26.3% % Reduction in Volume: -25.3% [id number] Page 14 of 18 Wound Description Classification: Category/Stage II Exudate Amount: Medium Exudate Type: Serosanguineous Exudate Color: red, brown Treatment Notes Wound #18 (Metatarsal head first) Wound Laterality: Right Cleanser Vashe 5.8 (oz) Discharge Instruction: damp to dry daily Peri-Wound Care Topical Primary Dressing Xeroform 5x9-HBD (in/in) Discharge Instruction: Apply Xeroform 5x9-HBD  (in/in) as directed Secondary Dressing ABD Pad 5x9 (in/in) Discharge Instruction: Cover with ABD pad Kerlix 4.5 x 4.1 (in/yd) Discharge Instruction: Apply Kerlix 4.5 x 4.1 (in/yd) as instructed Secured With ACE WRAP - 38M ACE Elastic Bandage With VELCRO Brand Closure, 4 (in) Compression Wrap Compression Stockings Add-Ons Electronic Signature(s) Signed: 11/20/2023 5:49:46 PM By: Purcell Sniff Signed: 11/22/2023 8:27:37 AM By: Alverta Sailors RN Entered By: Purcell Sniff on 11/20/2023 16:47:18 -------------------------------------------------------------------------------- Wound Assessment Details Patient Name: Date of Service: MCCO [name], DIA [name]. 11/20/2023 3:30 PM Medical Record Number: [id number] Patient Account Number: [id number] Date of Birth/Sex: Treating RN: April 18, 1941 (83 y.o. [name] Primary Care Rayette Mogg: Sadie Manna Other Clinician: Purcell Sniff Referring Kenna Kirn: Treating Marvel Mcphillips/Extender: Bethena Andre Sadie, Vishwanath Weeks in Treatment: 60 Wound Status Wound Number: 19 Primary Etiology: Venous Leg Ulcer Wound Location: Right, Anterior Lower Leg Wound Status: Open Wounding Event: Blister Date Acquired: 09/17/2023 Weeks Of Treatment: 9 Clustered Wound: Yes Wound Measurements Arango, Jamyla [name] ([id number]) Length: (cm) 2.3 Width: (cm) 1.2 Depth: (cm) 0.1 Area: (cm) 2.168 Volume: (cm) 0.217 [id number] Page 15 of 18 % Reduction in Area: 69.3% % Reduction in Volume: 69.3% Wound Description Classification: Full Thickness Without Exposed Support Exudate Amount: Medium Exudate Type: Serosanguineous Exudate Color: red, brown Structures Treatment Notes Wound #19 (Lower Leg) Wound Laterality: Right, Anterior Cleanser Vashe 5.8 (oz) Discharge Instruction: damp to dry daily Peri-Wound Care Topical Primary Dressing Xeroform 5x9-HBD (in/in) Discharge Instruction: Apply Xeroform 5x9-HBD (in/in) as directed Secondary  Dressing ABD Pad 5x9 (in/in) Discharge Instruction: Cover with ABD pad Kerlix 4.5 x 4.1 (in/yd) Discharge Instruction: Apply Kerlix 4.5 x 4.1 (in/yd) as instructed Secured With ACE WRAP - 38M ACE Elastic Bandage With VELCRO Brand Closure, 4 (in) Compression Wrap Compression Stockings Add-Ons Electronic Signature(s) Signed: 11/20/2023 5:49:46 PM By: Purcell Sniff Signed: 11/22/2023 8:27:37 AM By: Alverta Sailors RN Entered By: Purcell Sniff on 11/20/2023 16:47:18 -------------------------------------------------------------------------------- Wound Assessment Details Patient Name: Date of Service: MCCO [name], DIA [name]. 11/20/2023 3:30 PM  Medical Record Number: [id number] Patient Account Number: [id number] Date of Birth/Sex: Treating RN: 1941-04-23 (83 y.o. [name] Primary Care Glessie Eustice: Sadie Manna Other Clinician: Purcell Sniff Referring Markiesha Delia: Treating Lendell Gallick/Extender: Bethena Andre Sadie, Vishwanath Weeks in Treatment: 60 Wound Status Wound Number: 20 Primary Etiology: Pressure Ulcer Wound Location: Right Gluteal fold Wound Status: Open Wounding Event: Gradually Appeared Date Acquired: 10/13/2023 CARLIA, BOMKAMP ([id number]) [phone].pdf Page 16 of 18 Weeks Of Treatment: 1 Clustered Wound: No Wound Measurements Length: (cm) 1 Width: (cm) 0.9 Depth: (cm) 0.1 Area: (cm) 0.707 Volume: (cm) 0.071 % Reduction in Area: -28.5% % Reduction in Volume: -29.1% Wound Description Classification: Unstageable/Unclassified Exudate Amount: Medium Exudate Type: Serosanguineous Exudate Color: red, brown Treatment Notes Wound #20 (Gluteal fold) Wound Laterality: Right Cleanser Soap and Water Discharge Instruction: Gently cleanse wound with antibacterial soap, rinse and pat dry prior to dressing wounds Peri-Wound Care Topical Primary Dressing Xeroform-HBD 2x2 (in/in) Discharge Instruction: Apply Xeroform-HBD 2x2 (in/in) as  directed Secondary Dressing (BORDER) Zetuvit Plus SILICONE BORDER Dressing 4x4 (in/in) Discharge Instruction: Please do not put silicone bordered dressings under wraps. Use non-bordered dressing only. Secured With Compression Wrap Compression Stockings Facilities Manager) Signed: 11/20/2023 5:49:46 PM By: Purcell Sniff Signed: 11/22/2023 8:27:37 AM By: Alverta Sailors RN Entered By: Purcell Sniff on 11/20/2023 16:47:18 -------------------------------------------------------------------------------- Wound Assessment Details Patient Name: Date of Service: MCCO [name], DIA [name]. 11/20/2023 3:30 PM Medical Record Number: [id number] Patient Account Number: [id number] Date of Birth/Sex: Treating RN: 12-Aug-1941 (83 y.o. [name] Primary Care Tywana Robotham: Sadie Manna Other Clinician: Purcell Sniff Referring [name] [name]: Treating Keelon Zurn/Extender: Bethena Andre Sadie, Vishwanath Weeks in Treatment: 60 Wound Status Wound Number: 6 Primary Etiology: Pressure Ulcer Wound Location: Right Calcaneus Wound Status: Open Wounding Event: Pressure Injury Reisz, Maevis [name] ([id number]) [phone].pdf Page 17 of 18 Date Acquired: 07/23/2022 Weeks Of Treatment: 60 Clustered Wound: No Wound Measurements Length: (cm) 6.4 Width: (cm) 3.2 Depth: (cm) 0.1 Area: (cm) 16.085 Volume: (cm) 1.608 % Reduction in Area: -70.7% % Reduction in Volume: 43.1% Wound Description Classification: Category/Stage III Exudate Amount: Medium Exudate Type: Serosanguineous Exudate Color: red, brown Treatment Notes Wound #6 (Calcaneus) Wound Laterality: Right Cleanser Vashe 5.8 (oz) Discharge Instruction: damp to dry daily Peri-Wound Care Topical Primary Dressing Xeroform 5x9-HBD (in/in) Discharge Instruction: Apply Xeroform 5x9-HBD (in/in) as directed Secondary Dressing ABD Pad 5x9 (in/in) Discharge Instruction: Cover with ABD pad Kerlix 4.5 x 4.1 (in/yd) Discharge  Instruction: Apply Kerlix 4.5 x 4.1 (in/yd) as instructed Secured With ACE WRAP - 4M ACE Elastic Bandage With VELCRO Brand Closure, 4 (in) Compression Wrap Compression Stockings Add-Ons Electronic Signature(s) Signed: 11/20/2023 5:49:46 PM By: Purcell Sniff Signed: 11/22/2023 8:27:37 AM By: Alverta Sailors RN Entered By: Purcell Sniff on 11/20/2023 16:47:18 -------------------------------------------------------------------------------- Vitals Details Patient Name: Date of Service: MCCO [name], DIA [name]. 11/20/2023 3:30 PM Medical Record Number: [id number] Patient Account Number: [id number] Date of Birth/Sex: Treating RN: 1941-10-16 (83 y.o. [name] Primary Care Talene Glastetter: Sadie Manna Other Clinician: Purcell Sniff Referring Haig Gerardo: Treating Diontay Rosencrans/Extender: Bethena Andre Sadie Manna Weeks in Treatment: [address] Kaylianna [name] ([id number]) [id number] Page 18 of 18 Vital Signs Time Taken: 16:22 Temperature (F): 97.5 Weight (lbs): 98 Pulse (bpm): 92 Respiratory Rate (breaths/min): 18 Blood Pressure (mmHg): 120/77 Reference Range: 80 - 120 mg / dl Electronic Signature(s) Signed: 11/20/2023 5:49:46 PM By: Purcell Sniff Entered By: Purcell Sniff on 11/20/2023 16:24:26

## 2023-11-20 NOTE — Progress Notes (Addendum)
 [name], [name] ([id number]) [id number] Page 1 of 15 Visit Report for 11/20/2023 Chief Complaint Document Details Patient Name: Date of Service: MCCO RMICK, [name] 11/20/2023 3:30 PM Medical Record Number: [id number] Patient Account Number: [id number] Date of Birth/Sex: Treating RN: 04-Jan-1941 (83 y.o. [name] Primary Care Provider: Sadie Manna Other Clinician: Purcell Sniff Referring Provider: Treating Provider/Extender: Bethena Andre Sadie Manna Weeks in Treatment: 68 Information Obtained from: Patient Chief Complaint Right heel wound, right hip wound, right ankle wound, left ankle wound, right lower extremity wound, left anterior lower extremity wound, left hip wound Electronic Signature(s) Signed: 11/20/2023 3:09:02 PM By: Bethena Andre PA-C Entered By: Bethena Andre on 11/20/2023 12:09:02 -------------------------------------------------------------------------------- Debridement Details Patient Name: Date of Service: MCCO RMICK, [name] 11/20/2023 3:30 PM Medical Record Number: [id number] Patient Account Number: [id number] Date of Birth/Sex: Treating RN: Apr 17, 1941 (83 y.o. [name] Primary Care Provider: Sadie Manna Other Clinician: Purcell Sniff Referring Provider: Treating Provider/Extender: Bethena Andre Sadie Manna Weeks in Treatment: 60 Debridement Performed for Assessment: Wound #6 Right Calcaneus Performed By: Physician Bethena Andre, PA-C The following information was scribed by: Purcell Sniff The information was scribed for: Bethena Andre Debridement Type: Debridement Level of Consciousness (Pre-procedure): Awake and Alert Pre-procedure Verification/Time Out Yes - 17:04 Taken: Start Time: 17:04 Percent of Wound Bed Debrided: 50% T Area Debrided (cm): otal 8.04 Tissue and other material debrided: Viable, Non-Viable, Slough, Subcutaneous, Slough Level: Skin/Subcutaneous Tissue Debridement  Description: Excisional Instrument: Curette Bleeding: Minimum Hemostasis Achieved: Pressure Response to Treatment: Procedure was tolerated well Level of Consciousness [name], [name] ([id number]) [phone].pdf Page 2 of 15 Level of Consciousness (Post- Awake and Alert procedure): Post Debridement Measurements of Total Wound Length: (cm) 6.4 Stage: Category/Stage III Width: (cm) 3.2 Depth: (cm) 0.1 Volume: (cm) 1.608 Character of Wound/Ulcer Post Debridement: Stable Post Procedure Diagnosis Same as Pre-procedure Electronic Signature(s) Signed: 11/20/2023 5:49:46 PM By: Purcell Sniff Signed: 11/20/2023 6:16:28 PM By: Bethena Andre PA-C Signed: 11/22/2023 8:27:37 AM By: [name] Sailors RN Entered By: Purcell Sniff on 11/20/2023 14:04:48 -------------------------------------------------------------------------------- HPI Details Patient Name: Date of Service: MCCO RMICK, [name] 11/20/2023 3:30 PM Medical Record Number: [id number] Patient Account Number: [id number] Date of Birth/Sex: Treating RN: 12-24-1940 (83 y.o. [name] Primary Care Provider: Sadie Manna Other Clinician: Purcell Sniff Referring Provider: Treating Provider/Extender: Bethena Andre Sadie Manna Weeks in Treatment: 92 History of Present Illness HPI Description: 83 year old patient who comes with a referral for bilateral lower extremity edema and a lower extremity ulceration and has been sent by her PCP Dr. Malinda Bathe. I understand the patient was recently put on amoxicillin  and doxycycline  but could not tolerate the amoxicillin . doxycycline  course was completed. a BNP and EKG was supposed to be normal and the patient did not have any dyspnea. the patient has been on a diuretic. The patient was also prescribed a pair of elastic compression stockings of the 20-30 mmHg pressure variety. x-ray of the right ankle was done on 09/20/2015 and showed posttraumatic and  postsurgical changes of the right ankle with secondary degenerative changes of the tibiotalar joint and to a lesser degree the subtalar joint. No definite acute bony abnormalities are noted. Past medical history significant for diabetes mellitus, hypertension, hyperlipidemia, right breast cancer treated with a mastectomy in 2014. She has never smoked. 10/24/2015 -- she had delayed her vascular test because of her husband surgery but she is now ready to get him taken care of. He is also unable to use compression stockings and  hence we will need to order her Juzo wraps. 10/31/2015-- was seen by Dr. Marea on 10/28/2015. She had a left lower extremity arterial duplex done at his office a couple of years ago and that was essentially normal. Today they performed a venous duplex which revealed no evidence of deep vein thrombosis, superficial thrombophlebitis, no venous reflex was seen on the right and a minimal amount of reflux was seen on the left great saphenous vein but no significant reflux was seen. Impression was that there was a component of lymphedema present from a previous surgery and he would recommend compression stockings and leg elevation. 10/2; is a patient we are following every 2 weeks. Apparently seen by Dr. Searcy since she was last here no a day additional antibiotics were added. There was some suggestion about us  obtaining cultures and we could bring them to her attention. The patient has stage IV wound over the right greater trochanter and unstageable area on the left but it is probably stage IV as well. She has a small area on the tip of her left heel larger wounds on the left [name] left medial foot, left medial ankle Readmission: 07-24-2022 upon evaluation today patient presents for initial inspection here in our clinic concerning issues that she has been having with her legs this is actually been going on for several years according to what her family member with her today tells me as  well as what the patient reiterates as well. She is currently most recently been seeing Dr. Ashley and subsequently he had her in [place] boots. However 2 weeks ago he referred her to us  and then subsequently took her out of the [place]na boot wraps at that point. At this time the left leg looks to be worse in the right leg currently. She is on Lasix  and lisinopril  with hydrochlorothiazide she has high blood pressure she also has issues currently with lower extremity swelling and edema which has been an ongoing issue for her as well. Patient does have a history of chronic venous hypertension, lymphedema, diabetes mellitus type 2, hypertension, peripheral vascular disease, and neuropathy. Currently she is on Lasix  as well as lisinopril  with hydrochlorothiazide. 08-02-2022 upon evaluation today patient presents for reevaluation the good news is she is actually doing somewhat better in regard to the wound and the [facility], Trystin [name] ([id number]) [id number] Page 3 of 15 overall appearance and sinuses. The unfortunate thing is her infection really is not significantly improved we did have to switch out her antibiotic once we got that final result back and I switched her to Levaquin and away from the doxycycline . Unfortunately the doxycycline  had been doing poorly for her. In fact she had had diarrhea from the time she started taking it on Friday and she is still having it when she shows up today for evaluation. Again I was not aware of this and obviously she does appear to be somewhat dehydrated as well based on what I see. My concern which I discussed with the patient today is the possibility of a C. difficile infection. With that being said fact this started immediately upon taking the doxycycline  makes me think that it was just the medicine and is not completely out of her system yet despite having taken the last dose Tuesday morning. Nonetheless with what we are seeing currently I  want to be sure that reason I Minna contact her primary care provider and see if a would be willing to see her and test for C. difficile infection.  08-07-2022 upon evaluation today patient appears to be doing well currently in regard to her wounds which are actually measuring much better this is great news. Fortunately I do not see any signs of active infection locally or systemically at this time which is great as well. The good news is she was tested for a C. difficile infection and it was negative. I am very pleased and thankful for primary care provider for doing that so this means that she was just having a severe reaction to the doxycycline  we have added that to her allergy list at this point. 08-14-2022 upon evaluation today patient appears to be doing well currently in regard to her dehydration she is actually significantly improved compared to last time I saw her last week. With that being said I do not see any evidence of active infection systemically at this time which is great news. However locally she still does appear to have cellulitis in left lower extremity. I discussed with her today that I do believe she would benefit from going ahead and starting the Levaquin. Again we will concerned about the C. difficile infection of the diarrhea but that this turned out to be just an issue with a reaction to the doxycycline . My hope is that the Levaquin will not cause her any complication and will be able to treat the infection. I did review her arterial study as well which was dated on 07-31-2022. It showed that she had a ABI on the right of 1.30 and on the left of 1.27 with a TBI on the right of 1.12 and the left 1.21 this is a normal arterial study. Again on the patient's wound culture she actually did show evidence of Proteus, Morganella, and MRSA. Levaquin is a good option here across the board. 09/26/2022 Ms. Diane Ramseur is a 83 year old female with a past medical history of type 2  diabetes currently controlled on oral agents, venous insufficiency/lymphedema, right breast cancer and chronic diastolic heart failure that presents to the clinic for a 1 month history of nonhealing wounds to the left lower extremity, right heel and left buttocks. She states that the buttocks wound and the right heel wound developed while she was in the hospital. She was admitted on 08/22/2022 for severe sepsis secondary to acute sigmoid and distal colonic diverticulitis. At that time it was noted she had a stage I decubitus ulcer to her bilateral buttocks. A wound to the heel was not mentioned. She currently denies systemic signs of infection. She came into clinic in a blue gown with no undergarments. She has not been dressing the wounds. She states she just recently obtained home health and they are coming out for the first time this week. She is seen in our clinic often for lower extremity wounds secondary to venous insufficiency. 11/22; patient presents for follow-up. Son is present during the encounter. Per son it sounds like they are not doing any dressing changes. She states she has home health but they have not come out. She is going to let us  know which home health agency she has been approved for so we can send orders. Currently she denies signs of infection. 12/13; patient presents for follow-up. Patient has home health and they are coming out once a week. It appears that there has not been any dressing changes except for with home health. Patient has a newly discovered wound to the left foot. There is exposed bone. There is slight erythema and increased warmth to the surrounding tissue. Patient  is completely unaware of this wound. 12/20; patient presents for follow-up. She has been taking her oral antibiotics. She now has an eschar and wound to the right hip. She has been using Dakin's wet-to-dry dressings to the left foot wound and buttocks wound. She has been using Medihoney and silver  alginate to the right heel wound. She currently denies systemic signs of infection. She is mainly bedbound or in a wheelchair. 1/10; patient presents for follow-up. Patient had her left second toenail removed by Dr. Ashley, podiatry on 12/21. Unfortunately she did not feel well and she was admitted to the hospital for sepsis. Her right heel wound was thought to be infected and this was debrided in the OR by Dr. Ashley. Culture and bone biopsy had no growth. She has been using Medihoney to all the wound beds except for the lefty buttocks wound for which she uses Dakin's wet-to-dry dressings. She has developed a pressure injury to the left heel now. This is despite having Prevalon boots. Although she is not wearing them today. She has completed 4 weeks of Augmentin  and also a week of doxycycline  for osteomyelitis of the left foot. Her left foot wound no longer probes to bone. Currently she denies signs of infection. 1/24; patient presents for follow-up. She has been using Medihoney and Hydrofera Blue to the wound beds except for this buttocks wound she has been using Dakin's wet-to-dry dressings. She has developed 2 new wounds 1 to the left heel and the other to the right medial ankle. She claims she is wearing her Prevalon boots all the time although she does not have them on today. She currently denies signs of infection. 2/21; patient presents for follow-up. She was recently hospitalized on 12/19/2022 for sepsis secondary to Enterococcus bacillus bacteremia. She was treated with IV antibiotics and has completed her discharge oral antibiotics. She had a CT scanning of the abdomen/pelvis without acute abnormalities. Husband is present with patient today. They have been using Dakin's wet-to-dry dressings to the sacral wound and hip wound and Hydrofera Blue and Medihoney to the feet wounds. They are not changing the dressings daily. It is unclear how often they actually change the dressings. She has been  wearing her Prevalon boots at night but not during the day. She currently denies systemic signs of infection. 3/20; patient presents for follow-up. She again was in the hospital for severe sepsis on 2/24; MRIs at that time showed findings suspicious for osteomyelitis to the right hip. She is currently on Augmentin  to complete 4 weeks of this. She is following with ID. She has been using Dakin's wet-to-dry dressings to the sacrum and right hip however the solution is starting to cause discomfort. T the rest of the wound she has been using Hydrofera Blue and Medihoney. Patient o and son are not interested in doing palliative care or hospice. They state that they had discussions while in the hospital. 4/17; patient presents for follow-up. She has been using Vashe wet-to-dry dressings to the sacrum and right trochanter wound. She has been using Medihoney and Hydrofera Blue to the bilateral feet wounds. She has no issues or complaints today. She denies signs of infection. 5/1; patient developed a new wound to the right leg. Son was present and states that there was a scab and he removed it creating a wound. He has been keeping the area covered. T the sacral and right trochanter wound they have been using Vashe wet-to-dry dressings and Medihoney and Hydrofera Blue to the o bilateral  feet wounds. She denies signs of infection. 6/1; 1 month follow-up. She has a new area on the left anterior lower leg. In the meantime the sacral ulcer is closed. She still has an extensive wound on the right hip with exposed tendon over the greater trochanter. On the lower extremities the left anterior lower leg wound is new right ankle left heel and right heel all look reasonably stable. We are using Vashe wet-to-dry on the hip Medihoney and Hydrofera Blue on the lower extremities. She is accompanied by her son I think who is doing the dressings. They do not have a pressure-relief surface at home but they do have a hospital  bed 7/10; I have not seen this patient since 03/13/2023. She was scheduled to be seen on 5/29 however missed her appointment due to illness. She was seen on 6/5 by Dr. Rufus and she presents today for her follow-up. Unfortunately her wounds have declined since I last saw her. She has several new wounds including one to the left hip wound and one left anterior lower leg. The right hip wound has declined and has maggots with bone exposed. She has been using Vashe wet- to-dry dressings to Right hip wound and Medihoney and Hydrofera Blue to the The remaining wounds. Sacral wound remains closed. 7/24; patient presents for follow-up. At last clinic visit I recommended she go to the ED for potential admission, IV antibiotics and further imaging. She had MRI of the pelvis and left foot that showed osteomyelitis. Podiatry advised conservative management. She was also seen by infectious disease who discharged patient on 2 weeks of linezolid  and 2 weeks of ciprofloxacin . Patient is at home and has home health. Palliative care was consulted during the stay and she is full code and has declined further outpatient palliative care and hospice services.. She currently denies systemic signs of infection. Overall there is improvement in the appearance of the wounds. There is healthier granulation tissue present. However this is overall poor prognosis case. She has Prevalon boots but it is unclear if she is using these. She does not have them on today. She follows up with infectious disease next week. 8/7; Patient's been using Vashe wet-to-dry dressing to all the wound beds except for the left hip she has been using Santyl . She followed up with infectious [name], [name] ([id number]) [phone].pdf Page 4 of 15 disease on 8/1. Currently she is on linezolid  and ciprofloxacin . They recommended lab work to see if she needs extension of her oral antibiotics. She currently denies systemic signs of  infection. 8/21; patient presents for follow-up. She has been using Vashe wet-to-dry dressings to all wounds except the left hip she has been using Santyl . All wounds have improved in size and appearance. She has healthy granulation tissue present to all wounds except the left hip still has nonviable tissue throughout. She states she has completed her oral antibiotics per ID. She currently denies systemic signs of infection. 9/4; patient presents for follow-up. She has been using Vashe wet-to-dry dressings to all wounds except the left hip. T the left hip she has been using Santyl . o All wounds appear well-healing. She currently denies systemic signs of infection. 9/18; 2-week follow-up. This is a very disabled woman who is nonambulatory. 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 She has significant bilateral lower extremity pressure ulcers bilaterally. We have been using Vashe wet-to-dry to most of these except Santyl  in the left hip wound 10/9; this is a patient with  severe bilateral flexion contractures of her hips and knees. She is not ambulatory. She has extensive wounds on both trochanters. The right has exposed bone in the left nonviable tissue. I aggressively debrided this the last time she was here. We have been using Santyl  on the left hip and Vashe wet-to-dry on the right hip. She has wounds on her bilateral lower legs extensively on the right medial foot first met head ankle and lower leg area on the left lower leg and left heel. All of these seem somewhat improved again using Vashe wet-to-dry. We have been attempting to get a level 3 surface or some form of offloading surface for the patient although having trouble with their insurance which apparently is [facility] 10/16; patient is doing very well. The wounds on the lower extremity even the extensive areas seem to be a lot better with healthy granulation. We have been using the [employer] equivalent of Vashe  wet-to-dry. There is areas over the lateral part of the greater trochanters. The area on the left looks a lot better we have been using Santyl  with backing wet to dry gauze. We have been using Vashe wet-to-dry in the right greater trochanter. I think we are in line with changing the dressing next week to both hip areas We are apparently finally making some inroads in getting her a level 3 surface for her bed 10/23; patient continues to make nice improvement in the bilateral lower leg wounds using the equivalent of Vashe wet-to-dry. I did plan to change this dressing but the wounds are making such a nice improvement I continued it. T oday I have changed the greater trochanter stage IV pressure ulcers to a Prisma and then hydrogel wet-to-dry packing with border foam change every 2 D. Really remarkable improvement credit to the family this looking after her 09-18-2023 upon evaluation today patient appears to be doing more poorly especially in regard to her heels and lower extremities where she has deep tissue injuries on both heels and she has new blisters that have opened up on her legs. This is due to the fact that she is not elevating her legs she is staying in her wheelchair sitting not moving around throughout the day and this is not the best thing for her to be honest. Fortunately I do not think she is infected right now but at the same time this is still concerning to me. 10-02-2023 upon evaluation today patient appears to be doing really about the same in regard to her wounds. There is not much improvement here unfortunately. I do not see any signs of active infection at this time which is good news. With that being said she still has significant wounds on both hips the left is better than the right she also has many wounds scattered over her legs as well as both heels. 10-23-2023 patient continues to have significant issues with her wounds in general. With that being said fortunately I do not see  any signs of active infection at this time which is great news and in general I believe that we are making decent progress all things considered at this point. The heels are 1 exception she continues to have too much pressure to the heels unfortunately. With all that being said the patient is unfortunately still not using the offloading boots as much as she should be. I explained to her that this is going be of utmost importance that she is can to see this heal and get better. She voiced understanding  and her son states they will begin using them on a regular basis. 11/07/2023; this patient I remember from coverage in the fall of this year. She is a patient with multiple pressure ulcers. She also has chronic venous insufficiency in her legs. She has eschared areas on her bilateral plantar heels wounds on both lower legs. She has areas over the right greater trochanter that still probes to bone, left greater trochanter. She has a new wound area on the right buttock which is probably a stage II. We are bort using Xeroform and bordered foam on most of the wounds on the lower extremities. Betadine  Xeroform and gauze on the heels and Prisma as the primary dressing on the hips. Her legs are held in tight flexion contractures at the hips and knees. 11-20-23 upon evaluation today patient appears to be doing still about the same in regard to most of her wounds. I do see some signs of maybe minimal improvement the heels do not appear to be newly damaged as far as any pressure injury is concerned she does tell me that she tried to use her offloading boots more she is definitely doing at night she could possibly be doing a little bit more during the day according to what her son says but they are trying to do this some of the time. Fortunately I do not see any evidence of worsening overall. Electronic Signature(s) Signed: 11/20/2023 6:07:52 PM By: Bethena Ferraris PA-C Entered By: Bethena Ferraris on 11/20/2023  15:07:52 -------------------------------------------------------------------------------- Physical Exam Details Patient Name: Date of Service: MCCO RMICK, [name] 11/20/2023 3:30 PM Medical Record Number: [id number] Patient Account Number: [id number] Date of Birth/Sex: Treating RN: 1940/11/16 (83 y.o. [name] Primary Care Provider: Sadie Manna Other Clinician: Purcell Sniff Referring Provider: Treating Provider/Extender: Bethena Ferraris Sadie Manna Weeks in Treatment: [address], Kataleena [name] ([id number]) [id number] Page 5 of 15 Constitutional Well-nourished and well-hydrated in no acute distress. Respiratory normal breathing without difficulty. Psychiatric this patient is able to make decisions and demonstrates good insight into disease process. Alert and Oriented x 3. pleasant and cooperative. Notes Upon inspection patient's wounds appear to be doing decently well a lot of the slough and biofilm was able to be wiped off just with saline and gauze however I did have performed debridement in regard to her right heel she tolerated this debridement today without complication and postdebridement wound bed actually appears to be doing much better which is great news on actually pleased in that regard. Electronic Signature(s) Signed: 11/20/2023 6:08:15 PM By: Bethena Ferraris PA-C Entered By: Bethena Ferraris on 11/20/2023 15:08:15 -------------------------------------------------------------------------------- Physician Orders Details Patient Name: Date of Service: MCCO RMICK, [name] 11/20/2023 3:30 PM Medical Record Number: [id number] Patient Account Number: [id number] Date of Birth/Sex: Treating RN: 10-28-1941 (83 y.o. [name] Primary Care Provider: Sadie Manna Other Clinician: Purcell Sniff Referring Provider: Treating Provider/Extender: Bethena Ferraris Sadie Manna Weeks in Treatment: 46 The following information was scribed by:  Purcell Sniff The information was scribed for: Bethena Ferraris Verbal / Phone Orders: No Diagnosis Coding ICD-10 Coding Code Description [phone] Non-pressure chronic ulcer of other part of left lower leg with fat layer exposed L97.812 Non-pressure chronic ulcer of other part of right lower leg with fat layer exposed I87.312 Chronic venous hypertension (idiopathic) with ulcer of left lower extremity I89.0 Lymphedema, not elsewhere classified E11.622 Type 2 diabetes mellitus with other skin ulcer L89.610 Pressure ulcer of right heel, unstageable L89.513 Pressure ulcer of right ankle, stage 3 L89.620  Pressure ulcer of left heel, unstageable M86.172 Other acute osteomyelitis, left ankle and foot L89.214 Pressure ulcer of right hip, stage 4 L89.220 Pressure ulcer of left hip, unstageable I50.32 Chronic diastolic (congestive) heart failure Z79.84 Long term (current) use of oral hypoglycemic drugs Follow-up Appointments Return Appointment in 2 weeks. Home Health Home Health Company: - Arlina Fonder, RN [phone] [facility] for wound care. May utilize formulary equivalent dressing for wound treatment orders unless otherwise specified. Home Health Nurse may visit PRN to address patients wound care needs. Conwell, Grethel [name] ([id number]) [id number] Page 6 of 15 Off-Loading Low air-loss mattress (Group 2) Wound Treatment Wound #11 - Trochanter Wound Laterality: Right Cleanser: Vashe 5.8 (oz) 1 x Per Day/30 Days Discharge Instructions: Use as directed for cleaning wound Prim Dressing: Prisma 4.34 (in) 1 x Per Day/30 Days ary Discharge Instructions: pack into tunnel and wound Secondary Dressing: Gauze 1 x Per Day/30 Days Discharge Instructions: moisten with Vashe and place over collagen Secondary Dressing: (BORDER) Zetuvit Plus SILICONE BORDER Dressing 5x5 (in/in) 1 x Per Day/30 Days Discharge Instructions: Please do not put silicone bordered  dressings under wraps. Use non-bordered dressing only. Wound #13 - Calcaneus Wound Laterality: Left Cleanser: Vashe 5.8 (oz) (Home Health) 1 x Per Day/30 Days Discharge Instructions: damp to dry daily Topical: Betadine  1 x Per Day/30 Days Discharge Instructions: Apply betadine  to heel Secondary Dressing: ABD Pad 5x9 (in/in) 1 x Per Day/30 Days Discharge Instructions: Cover with ABD pad Secondary Dressing: Kerlix 4.5 x 4.1 (in/yd) 1 x Per Day/30 Days Discharge Instructions: Apply Kerlix 4.5 x 4.1 (in/yd) as instructed Secured With: ACE WRAP - 74M ACE Elastic Bandage With VELCRO Brand Closure, 4 (in) 1 x Per Day/30 Days Wound #14 - Lower Leg Wound Laterality: Right, Medial, Distal Cleanser: Vashe 5.8 (oz) (Home Health) 1 x Per Day/30 Days Discharge Instructions: damp to dry daily Prim Dressing: Xeroform 5x9-HBD (in/in) 1 x Per Day/30 Days ary Discharge Instructions: Apply Xeroform 5x9-HBD (in/in) as directed Secondary Dressing: ABD Pad 5x9 (in/in) 1 x Per Day/30 Days Discharge Instructions: Cover with ABD pad Secondary Dressing: Kerlix 4.5 x 4.1 (in/yd) 1 x Per Day/30 Days Discharge Instructions: Apply Kerlix 4.5 x 4.1 (in/yd) as instructed Secured With: ACE WRAP - 74M ACE Elastic Bandage With VELCRO Brand Closure, 4 (in) 1 x Per Day/30 Days Wound #15 - Lower Leg Wound Laterality: Left, Anterior Cleanser: Vashe 5.8 (oz) (Home Health) 1 x Per Day/30 Days Discharge Instructions: damp to dry daily Prim Dressing: Xeroform 5x9-HBD (in/in) 1 x Per Day/30 Days ary Discharge Instructions: Apply Xeroform 5x9-HBD (in/in) as directed Secondary Dressing: ABD Pad 5x9 (in/in) 1 x Per Day/30 Days Discharge Instructions: Cover with ABD pad Secondary Dressing: Kerlix 4.5 x 4.1 (in/yd) 1 x Per Day/30 Days Discharge Instructions: Apply Kerlix 4.5 x 4.1 (in/yd) as instructed Secured With: ACE WRAP - 74M ACE Elastic Bandage With VELCRO Brand Closure, 4 (in) 1 x Per Day/30 Days Wound #16 - Trochanter Wound  Laterality: Left Cleanser: Vashe 5.8 (oz) 1 x Per Day/30 Days Discharge Instructions: Use as directed for cleaning wound Prim Dressing: Prisma 4.34 (in) 1 x Per Day/30 Days ary Discharge Instructions: pack into tunnel and wound Secondary Dressing: Gauze 1 x Per Day/30 Days Discharge Instructions: moisten with Vashe and place over collagen Secondary Dressing: (BORDER) Zetuvit Plus SILICONE BORDER Dressing 5x5 (in/in) 1 x Per Day/30 Days Discharge Instructions: Please do not put silicone bordered dressings under wraps. Use non-bordered dressing only. [name], [name] ([id number]) [id number] Page 7  of 15 Wound #18 - Metatarsal head first Wound Laterality: Right Cleanser: Vashe 5.8 (oz) (Home Health) 1 x Per Day/30 Days Discharge Instructions: damp to dry daily Prim Dressing: Xeroform 5x9-HBD (in/in) 1 x Per Day/30 Days ary Discharge Instructions: Apply Xeroform 5x9-HBD (in/in) as directed Secondary Dressing: ABD Pad 5x9 (in/in) 1 x Per Day/30 Days Discharge Instructions: Cover with ABD pad Secondary Dressing: Kerlix 4.5 x 4.1 (in/yd) 1 x Per Day/30 Days Discharge Instructions: Apply Kerlix 4.5 x 4.1 (in/yd) as instructed Secured With: ACE WRAP - 62M ACE Elastic Bandage With VELCRO Brand Closure, 4 (in) 1 x Per Day/30 Days Wound #19 - Lower Leg Wound Laterality: Right, Anterior Cleanser: Vashe 5.8 (oz) (Home Health) 1 x Per Day/30 Days Discharge Instructions: damp to dry daily Prim Dressing: Xeroform 5x9-HBD (in/in) 1 x Per Day/30 Days ary Discharge Instructions: Apply Xeroform 5x9-HBD (in/in) as directed Secondary Dressing: ABD Pad 5x9 (in/in) 1 x Per Day/30 Days Discharge Instructions: Cover with ABD pad Secondary Dressing: Kerlix 4.5 x 4.1 (in/yd) 1 x Per Day/30 Days Discharge Instructions: Apply Kerlix 4.5 x 4.1 (in/yd) as instructed Secured With: ACE WRAP - 62M ACE Elastic Bandage With VELCRO Brand Closure, 4 (in) 1 x Per Day/30 Days Wound #20 - Gluteal  fold Wound Laterality: Right Cleanser: Soap and Water 1 x Per Day/30 Days Discharge Instructions: Gently cleanse wound with antibacterial soap, rinse and pat dry prior to dressing wounds Prim Dressing: Xeroform-HBD 2x2 (in/in) 1 x Per Day/30 Days ary Discharge Instructions: Apply Xeroform-HBD 2x2 (in/in) as directed Secondary Dressing: (BORDER) Zetuvit Plus SILICONE BORDER Dressing 4x4 (in/in) 1 x Per Day/30 Days Discharge Instructions: Please do not put silicone bordered dressings under wraps. Use non-bordered dressing only. Wound #6 - Calcaneus Wound Laterality: Right Cleanser: Vashe 5.8 (oz) (Home Health) 1 x Per Day/30 Days Discharge Instructions: damp to dry daily Prim Dressing: Xeroform 5x9-HBD (in/in) 1 x Per Day/30 Days ary Discharge Instructions: Apply Xeroform 5x9-HBD (in/in) as directed Secondary Dressing: ABD Pad 5x9 (in/in) 1 x Per Day/30 Days Discharge Instructions: Cover with ABD pad Secondary Dressing: Kerlix 4.5 x 4.1 (in/yd) 1 x Per Day/30 Days Discharge Instructions: Apply Kerlix 4.5 x 4.1 (in/yd) as instructed Secured With: ACE WRAP - 62M ACE Elastic Bandage With VELCRO Brand Closure, 4 (in) 1 x Per Day/30 Days Electronic Signature(s) Signed: 11/20/2023 5:49:46 PM By: Purcell Sniff Signed: 11/20/2023 6:16:28 PM By: Bethena Ferraris PA-C Entered By: Purcell Sniff on 11/20/2023 14:05:19 [name], [name] ([id number]) [id number] Page 8 of 15 -------------------------------------------------------------------------------- Problem List Details Patient Name: Date of Service: MCCO RMICK, [name] 11/20/2023 3:30 PM Medical Record Number: [id number] Patient Account Number: [id number] Date of Birth/Sex: Treating RN: April 30, 1941 (83 y.o. [name] Primary Care Provider: Sadie Manna Other Clinician: Purcell Sniff Referring Provider: Treating Provider/Extender: Bethena Ferraris Sadie Manna Weeks in Treatment: 69 Active  Problems ICD-10 Encounter Code Description Active Date MDM Diagnosis [id number] Non-pressure chronic ulcer of other part of left lower leg with fat layer 09/26/2022 No Yes exposed L97.812 Non-pressure chronic ulcer of other part of right lower leg with fat layer 03/13/2023 No Yes exposed I87.312 Chronic venous hypertension (idiopathic) with ulcer of left lower extremity 09/26/2022 No Yes I89.0 Lymphedema, not elsewhere classified 09/26/2022 No Yes E11.622 Type 2 diabetes mellitus with other skin ulcer 09/26/2022 No Yes L89.610 Pressure ulcer of right heel, unstageable 09/26/2022 No Yes L89.513 Pressure ulcer of right ankle, stage 3 12/05/2022 No Yes L89.620 Pressure ulcer of left heel, unstageable 12/05/2022 No Yes M86.172 Other acute  osteomyelitis, left ankle and foot 10/24/2022 No Yes L89.214 Pressure ulcer of right hip, stage 4 11/21/2022 No Yes L89.220 Pressure ulcer of left hip, unstageable 05/22/2023 No Yes I50.32 Chronic diastolic (congestive) heart failure 09/26/2022 No Yes [name], [name] ([id number]) [phone].pdf Page 9 of 15 Z79.84 Long term (current) use of oral hypoglycemic drugs 03/13/2023 No Yes Inactive Problems ICD-10 Code Description Active Date Inactive Date S81.802A Unspecified open wound, left lower leg, initial encounter 05/22/2023 05/22/2023 Resolved Problems ICD-10 Code Description Active Date Resolved Date L89.320 Pressure ulcer of left buttock, unstageable 09/26/2022 09/26/2022 S91.302A Unspecified open wound, left foot, initial encounter 10/24/2022 10/24/2022 Electronic Signature(s) Signed: 11/20/2023 3:08:55 PM By: Bethena Ferraris PA-C Entered By: Bethena Ferraris on 11/20/2023 12:08:54 -------------------------------------------------------------------------------- Progress Note Details Patient Name: Date of Service: MCCO RMICK, [name] 11/20/2023 3:30 PM Medical Record Number: [id number] Patient Account Number: [id number] Date of Birth/Sex:  Treating RN: 1941-04-08 (83 y.o. [name] Primary Care Provider: Sadie Manna Other Clinician: Purcell Sniff Referring Provider: Treating Provider/Extender: Bethena Ferraris Sadie Manna Weeks in Treatment: 58 Subjective Chief Complaint Information obtained from Patient Right heel wound, right hip wound, right ankle wound, left ankle wound, right lower extremity wound, left anterior lower extremity wound, left hip wound History of Present Illness (HPI) 83 year old patient who comes with a referral for bilateral lower extremity edema and a lower extremity ulceration and has been sent by her PCP Dr. Malinda Bathe. I understand the patient was recently put on amoxicillin  and doxycycline  but could not tolerate the amoxicillin . doxycycline  course was completed. a BNP and EKG was supposed to be normal and the patient did not have any dyspnea. the patient has been on a diuretic. The patient was also prescribed a pair of elastic compression stockings of the 20-30 mmHg pressure variety. x-ray of the right ankle was done on 09/20/2015 and showed posttraumatic and postsurgical changes of the right ankle with secondary degenerative changes of the tibiotalar joint and to a lesser degree the subtalar joint. No definite acute bony abnormalities are noted. Past medical history significant for diabetes mellitus, hypertension, hyperlipidemia, right breast cancer treated with a mastectomy in 2014. She has never smoked. 10/24/2015 -- she had delayed her vascular test because of her husband surgery but she is now ready to get him taken care of. He is also unable to use compression stockings and hence we will need to order her Juzo wraps. 10/31/2015-- was seen by Dr. Marea on 10/28/2015. She had a left lower extremity arterial duplex done at his office a couple of years ago and that was essentially normal. Today they performed a venous duplex which revealed no evidence of deep vein thrombosis, superficial  thrombophlebitis, no venous reflex was seen on the right and a minimal amount of reflux was seen on the left great saphenous vein but no significant reflux was seen. Impression was that there was a component of lymphedema present from a previous surgery and he would recommend compression stockings and leg elevation. 10/2; is a patient we are following every 2 weeks. Apparently seen by Dr. Searcy since she was last here no a day additional antibiotics were added. There was some suggestion about us  obtaining cultures and we could bring them to her attention. [name], [name] ([id number]) [id number] Page 10 of 15 The patient has stage IV wound over the right greater trochanter and unstageable area on the left but it is probably stage IV as well. She has a small area on the tip of her left heel  larger wounds on the left [name] left medial foot, left medial ankle Readmission: 07-24-2022 upon evaluation today patient presents for initial inspection here in our clinic concerning issues that she has been having with her legs this is actually been going on for several years according to what her family member with her today tells me as well as what the patient reiterates as well. She is currently most recently been seeing Dr. Ashley and subsequently he had her in [place] boots. However 2 weeks ago he referred her to us  and then subsequently took her out of the [place] boot wraps at that point. At this time the left leg looks to be worse in the right leg currently. She is on Lasix  and lisinopril  with hydrochlorothiazide she has high blood pressure she also has issues currently with lower extremity swelling and edema which has been an ongoing issue for her as well. Patient does have a history of chronic venous hypertension, lymphedema, diabetes mellitus type 2, hypertension, peripheral vascular disease, and neuropathy. Currently she is on Lasix  as well as lisinopril  with  hydrochlorothiazide. 08-02-2022 upon evaluation today patient presents for reevaluation the good news is she is actually doing somewhat better in regard to the wound and the overall appearance and sinuses. The unfortunate thing is her infection really is not significantly improved we did have to switch out her antibiotic once we got that final result back and I switched her to Levaquin and away from the doxycycline . Unfortunately the doxycycline  had been doing poorly for her. In fact she had had diarrhea from the time she started taking it on Friday and she is still having it when she shows up today for evaluation. Again I was not aware of this and obviously she does appear to be somewhat dehydrated as well based on what I see. My concern which I discussed with the patient today is the possibility of a C. difficile infection. With that being said fact this started immediately upon taking the doxycycline  makes me think that it was just the medicine and is not completely out of her system yet despite having taken the last dose Tuesday morning. Nonetheless with what we are seeing currently I want to be sure that reason I Minna contact her primary care provider and see if a would be willing to see her and test for C. difficile infection. 08-07-2022 upon evaluation today patient appears to be doing well currently in regard to her wounds which are actually measuring much better this is great news. Fortunately I do not see any signs of active infection locally or systemically at this time which is great as well. The good news is she was tested for a C. difficile infection and it was negative. I am very pleased and thankful for primary care provider for doing that so this means that she was just having a severe reaction to the doxycycline  we have added that to her allergy list at this point. 08-14-2022 upon evaluation today patient appears to be doing well currently in regard to her dehydration she is actually  significantly improved compared to last time I saw her last week. With that being said I do not see any evidence of active infection systemically at this time which is great news. However locally she still does appear to have cellulitis in left lower extremity. I discussed with her today that I do believe she would benefit from going ahead and starting the Levaquin. Again we will concerned about the C. difficile infection of the  diarrhea but that this turned out to be just an issue with a reaction to the doxycycline . My hope is that the Levaquin will not cause her any complication and will be able to treat the infection. I did review her arterial study as well which was dated on 07-31-2022. It showed that she had a ABI on the right of 1.30 and on the left of 1.27 with a TBI on the right of 1.12 and the left 1.21 this is a normal arterial study. Again on the patient's wound culture she actually did show evidence of Proteus, Morganella, and MRSA. Levaquin is a good option here across the board. 09/26/2022 Ms. Diane Douglass is a 83 year old female with a past medical history of type 2 diabetes currently controlled on oral agents, venous insufficiency/lymphedema, right breast cancer and chronic diastolic heart failure that presents to the clinic for a 1 month history of nonhealing wounds to the left lower extremity, right heel and left buttocks. She states that the buttocks wound and the right heel wound developed while she was in the hospital. She was admitted on 08/22/2022 for severe sepsis secondary to acute sigmoid and distal colonic diverticulitis. At that time it was noted she had a stage I decubitus ulcer to her bilateral buttocks. A wound to the heel was not mentioned. She currently denies systemic signs of infection. She came into clinic in a blue gown with no undergarments. She has not been dressing the wounds. She states she just recently obtained home health and they are coming out for the  first time this week. She is seen in our clinic often for lower extremity wounds secondary to venous insufficiency. 11/22; patient presents for follow-up. Son is present during the encounter. Per son it sounds like they are not doing any dressing changes. She states she has home health but they have not come out. She is going to let us  know which home health agency she has been approved for so we can send orders. Currently she denies signs of infection. 12/13; patient presents for follow-up. Patient has home health and they are coming out once a week. It appears that there has not been any dressing changes except for with home health. Patient has a newly discovered wound to the left foot. There is exposed bone. There is slight erythema and increased warmth to the surrounding tissue. Patient is completely unaware of this wound. 12/20; patient presents for follow-up. She has been taking her oral antibiotics. She now has an eschar and wound to the right hip. She has been using Dakin's wet-to-dry dressings to the left foot wound and buttocks wound. She has been using Medihoney and silver alginate to the right heel wound. She currently denies systemic signs of infection. She is mainly bedbound or in a wheelchair. 1/10; patient presents for follow-up. Patient had her left second toenail removed by Dr. Ashley, podiatry on 12/21. Unfortunately she did not feel well and she was admitted to the hospital for sepsis. Her right heel wound was thought to be infected and this was debrided in the OR by Dr. Ashley. Culture and bone biopsy had no growth. She has been using Medihoney to all the wound beds except for the lefty buttocks wound for which she uses Dakin's wet-to-dry dressings. She has developed a pressure injury to the left heel now. This is despite having Prevalon boots. Although she is not wearing them today. She has completed 4 weeks of Augmentin  and also a week of doxycycline  for osteomyelitis of the  left foot. Her left foot wound no longer probes to bone. Currently she denies signs of infection. 1/24; patient presents for follow-up. She has been using Medihoney and Hydrofera Blue to the wound beds except for this buttocks wound she has been using Dakin's wet-to-dry dressings. She has developed 2 new wounds 1 to the left heel and the other to the right medial ankle. She claims she is wearing her Prevalon boots all the time although she does not have them on today. She currently denies signs of infection. 2/21; patient presents for follow-up. She was recently hospitalized on 12/19/2022 for sepsis secondary to Enterococcus bacillus bacteremia. She was treated with IV antibiotics and has completed her discharge oral antibiotics. She had a CT scanning of the abdomen/pelvis without acute abnormalities. Husband is present with patient today. They have been using Dakin's wet-to-dry dressings to the sacral wound and hip wound and Hydrofera Blue and Medihoney to the feet wounds. They are not changing the dressings daily. It is unclear how often they actually change the dressings. She has been wearing her Prevalon boots at night but not during the day. She currently denies systemic signs of infection. 3/20; patient presents for follow-up. She again was in the hospital for severe sepsis on 2/24; MRIs at that time showed findings suspicious for osteomyelitis to the right hip. She is currently on Augmentin  to complete 4 weeks of this. She is following with ID. She has been using Dakin's wet-to-dry dressings to the sacrum and right hip however the solution is starting to cause discomfort. T the rest of the wound she has been using Hydrofera Blue and Medihoney. Patient o and son are not interested in doing palliative care or hospice. They state that they had discussions while in the hospital. 4/17; patient presents for follow-up. She has been using Vashe wet-to-dry dressings to the sacrum and right trochanter  wound. She has been using Medihoney and Hydrofera Blue to the bilateral feet wounds. She has no issues or complaints today. She denies signs of infection. 5/1; patient developed a new wound to the right leg. Son was present and states that there was a scab and he removed it creating a wound. He has been keeping the area covered. T the sacral and right trochanter wound they have been using Vashe wet-to-dry dressings and Medihoney and Hydrofera Blue to the o bilateral feet wounds. She denies signs of infection. 6/1; 1 month follow-up. She has a new area on the left anterior lower leg. In the meantime the sacral ulcer is closed. She still has an extensive wound on the right hip with exposed tendon over the greater trochanter. On the lower extremities the left anterior lower leg wound is new right ankle left heel and right heel all look reasonably stable. [name], [name] ([id number]) [id number] Page 11 of 15 We are using Vashe wet-to-dry on the hip Medihoney and Hydrofera Blue on the lower extremities. She is accompanied by her son I think who is doing the dressings. They do not have a pressure-relief surface at home but they do have a hospital bed 7/10; I have not seen this patient since 03/13/2023. She was scheduled to be seen on 5/29 however missed her appointment due to illness. She was seen on 6/5 by Dr. Rufus and she presents today for her follow-up. Unfortunately her wounds have declined since I last saw her. She has several new wounds including one to the left hip wound and one left anterior lower leg. The right hip wound has  declined and has maggots with bone exposed. She has been using Vashe wet- to-dry dressings to Right hip wound and Medihoney and Hydrofera Blue to the The remaining wounds. Sacral wound remains closed. 7/24; patient presents for follow-up. At last clinic visit I recommended she go to the ED for potential admission, IV antibiotics and further  imaging. She had MRI of the pelvis and left foot that showed osteomyelitis. Podiatry advised conservative management. She was also seen by infectious disease who discharged patient on 2 weeks of linezolid  and 2 weeks of ciprofloxacin . Patient is at home and has home health. Palliative care was consulted during the stay and she is full code and has declined further outpatient palliative care and hospice services.. She currently denies systemic signs of infection. Overall there is improvement in the appearance of the wounds. There is healthier granulation tissue present. However this is overall poor prognosis case. She has Prevalon boots but it is unclear if she is using these. She does not have them on today. She follows up with infectious disease next week. 8/7; Patient's been using Vashe wet-to-dry dressing to all the wound beds except for the left hip she has been using Santyl . She followed up with infectious disease on 8/1. Currently she is on linezolid  and ciprofloxacin . They recommended lab work to see if she needs extension of her oral antibiotics. She currently denies systemic signs of infection. 8/21; patient presents for follow-up. She has been using Vashe wet-to-dry dressings to all wounds except the left hip she has been using Santyl . All wounds have improved in size and appearance. She has healthy granulation tissue present to all wounds except the left hip still has nonviable tissue throughout. She states she has completed her oral antibiotics per ID. She currently denies systemic signs of infection. 9/4; patient presents for follow-up. She has been using Vashe wet-to-dry dressings to all wounds except the left hip. T the left hip she has been using Santyl . o All wounds appear well-healing. She currently denies systemic signs of infection. 9/18; 2-week follow-up. This is a very disabled woman who is nonambulatory. She has bilateral hip and knee flexion contractures which are fairly  severe. She has significant bilateral lower extremity pressure ulcers bilaterally. We have been using Vashe wet-to-dry to most of these except Santyl  in the left hip wound 10/9; this is a patient with severe bilateral flexion contractures of her hips and knees. She is not ambulatory. She has extensive wounds on both trochanters. The right has exposed bone in the left nonviable tissue. I aggressively debrided this the last time she was here. We have been using Santyl  on the left hip and Vashe wet-to-dry on the right hip. She has wounds on her bilateral lower legs extensively on the right medial foot first met head ankle and lower leg area on the left lower leg and left heel. All of these seem somewhat improved again using Vashe wet-to-dry. We have been attempting to get a level 3 surface or some form of offloading surface for the patient although having trouble with their insurance which apparently is [facility] 10/16; patient is doing very well. The wounds on the lower extremity even the extensive areas seem to be a lot better with healthy granulation. We have been using the [facility] equivalent of Vashe wet-to-dry. There is areas over the lateral part of the greater trochanters. The area on the left looks a lot better we have been using Santyl  with backing wet to dry gauze. We have been using Vashe  wet-to-dry in the right greater trochanter. I think we are in line with changing the dressing next week to both hip areas We are apparently finally making some inroads in getting her a level 3 surface for her bed 10/23; patient continues to make nice improvement in the bilateral lower leg wounds using the equivalent of Vashe wet-to-dry. I did plan to change this dressing but the wounds are making such a nice improvement I continued it. T oday I have changed the greater trochanter stage IV pressure ulcers to a Prisma and then hydrogel wet-to-dry packing with border foam change every 2 D. Really remarkable improvement  credit to the family this looking after her 09-18-2023 upon evaluation today patient appears to be doing more poorly especially in regard to her heels and lower extremities where she has deep tissue injuries on both heels and she has new blisters that have opened up on her legs. This is due to the fact that she is not elevating her legs she is staying in her wheelchair sitting not moving around throughout the day and this is not the best thing for her to be honest. Fortunately I do not think she is infected right now but at the same time this is still concerning to me. 10-02-2023 upon evaluation today patient appears to be doing really about the same in regard to her wounds. There is not much improvement here unfortunately. I do not see any signs of active infection at this time which is good news. With that being said she still has significant wounds on both hips the left is better than the right she also has many wounds scattered over her legs as well as both heels. 10-23-2023 patient continues to have significant issues with her wounds in general. With that being said fortunately I do not see any signs of active infection at this time which is great news and in general I believe that we are making decent progress all things considered at this point. The heels are 1 exception she continues to have too much pressure to the heels unfortunately. With all that being said the patient is unfortunately still not using the offloading boots as much as she should be. I explained to her that this is going be of utmost importance that she is can to see this heal and get better. She voiced understanding and her son states they will begin using them on a regular basis. 11/07/2023; this patient I remember from coverage in the fall of this year. She is a patient with multiple pressure ulcers. She also has chronic venous insufficiency in her legs. She has eschared areas on her bilateral plantar heels wounds on both  lower legs. She has areas over the right greater trochanter that still probes to bone, left greater trochanter. She has a new wound area on the right buttock which is probably a stage II. We are bort using Xeroform and bordered foam on most of the wounds on the lower extremities. Betadine  Xeroform and gauze on the heels and Prisma as the primary dressing on the hips. Her legs are held in tight flexion contractures at the hips and knees. 11-20-23 upon evaluation today patient appears to be doing still about the same in regard to most of her wounds. I do see some signs of maybe minimal improvement the heels do not appear to be newly damaged as far as any pressure injury is concerned she does tell me that she tried to use her offloading boots more she is  definitely doing at night she could possibly be doing a little bit more during the day according to what her son says but they are trying to do this some of the time. Fortunately I do not see any evidence of worsening overall. Objective Constitutional Well-nourished and well-hydrated in no acute distress. Vitals Time Taken: 4:22 PM, Weight: 98 lbs, Temperature: 97.5 F, Pulse: 92 bpm, Respiratory Rate: 18 breaths/min, Blood Pressure: 120/77 mmHg. [name], [name] ([id number]) [id number] Page 12 of 15 Respiratory normal breathing without difficulty. Psychiatric this patient is able to make decisions and demonstrates good insight into disease process. Alert and Oriented x 3. pleasant and cooperative. General Notes: Upon inspection patient's wounds appear to be doing decently well a lot of the slough and biofilm was able to be wiped off just with saline and gauze however I did have performed debridement in regard to her right heel she tolerated this debridement today without complication and postdebridement wound bed actually appears to be doing much better which is great news on actually pleased in that  regard. Integumentary (Hair, Skin) Wound #11 status is Open. Original cause of wound was Pressure Injury. The date acquired was: 10/24/2022. The wound has been in treatment 55 weeks. The wound is located on the Right Trochanter. The wound measures 2.3cm length x 1.5cm width x 0.2cm depth; 2.71cm^2 area and 0.542cm^3 volume. There is bone, muscle, tendon, Fat Layer (Subcutaneous Tissue), and fascia exposed. There is undermining starting at 1:00 and ending at 11:00 with a maximum distance of 2.8cm. There is a medium amount of serosanguineous drainage noted. The wound margin is well defined and not attached to the wound base. There is large (67-100%) red granulation within the wound bed. There is no necrotic tissue within the wound bed. Wound #13 status is Open. Original cause of wound was Pressure Injury. The date acquired was: 11/28/2022. The wound has been in treatment 50 weeks. The wound is located on the Left Calcaneus. The wound measures 2.5cm length x 3.5cm width x 0.1cm depth; 6.872cm^2 area and 0.687cm^3 volume. There is Fat Layer (Subcutaneous Tissue) exposed. There is a medium amount of serosanguineous drainage noted. The wound margin is flat and intact. There is medium (34-66%) pink granulation within the wound bed. There is a medium (34-66%) amount of necrotic tissue within the wound bed including Eschar and Adherent Slough. Wound #14 status is Open. Original cause of wound was Gradually Appeared. The date acquired was: 03/10/2023. The wound has been in treatment 36 weeks. The wound is located on the Right,Distal,Medial Lower Leg. The wound measures 7.2cm length x 3cm width x 0.1cm depth; 16.965cm^2 area and 1.696cm^3 volume. There is Fat Layer (Subcutaneous Tissue) exposed. There is a large amount of serosanguineous drainage noted. There is large (67-100%) red granulation within the wound bed. There is a small (1-33%) amount of necrotic tissue within the wound bed including Adherent  Slough. Wound #15 status is Open. Original cause of wound was Pressure Injury. The date acquired was: 04/11/2023. The wound has been in treatment 31 weeks. The wound is located on the Left,Anterior Lower Leg. The wound measures 9.4cm length x 10cm width x 0.1cm depth; 73.827cm^2 area and 7.383cm^3 volume. There is tendon and Fat Layer (Subcutaneous Tissue) exposed. There is a medium amount of serosanguineous drainage noted. There is no granulation within the wound bed. There is no necrotic tissue within the wound bed. Wound #16 status is Open. Original cause of wound was Pressure Injury. The date acquired was: 05/22/2023. The wound  has been in treatment 26 weeks. The wound is located on the Left Trochanter. The wound measures 0.8cm length x 0.4cm width x 0.7cm depth; 0.251cm^2 area and 0.176cm^3 volume. There is Fat Layer (Subcutaneous Tissue) exposed. There is tunneling at 6:00 with a maximum distance of 1.7cm. There is a medium amount of serosanguineous drainage noted. There is a large (67-100%) amount of necrotic tissue within the wound bed including Eschar and Adherent Slough. Wound #18 status is Open. Original cause of wound was Pressure Injury. The date acquired was: 07/31/2023. The wound has been in treatment 16 weeks. The wound is located on the Right Metatarsal head first. The wound measures 1cm length x 1.2cm width x 0.1cm depth; 0.942cm^2 area and 0.094cm^3 volume. There is a medium amount of serosanguineous drainage noted. Wound #19 status is Open. Original cause of wound was Blister. The date acquired was: 09/17/2023. The wound has been in treatment 9 weeks. The wound is located on the Right,Anterior Lower Leg. The wound measures 2.3cm length x 1.2cm width x 0.1cm depth; 2.168cm^2 area and 0.217cm^3 volume. There is a medium amount of serosanguineous drainage noted. Wound #20 status is Open. Original cause of wound was Gradually Appeared. The date acquired was: 10/13/2023. The wound has been  in treatment 1 weeks. The wound is located on the Right Gluteal fold. The wound measures 1cm length x 0.9cm width x 0.1cm depth; 0.707cm^2 area and 0.071cm^3 volume. There is a medium amount of serosanguineous drainage noted. Wound #6 status is Open. Original cause of wound was Pressure Injury. The date acquired was: 07/23/2022. The wound has been in treatment 60 weeks. The wound is located on the Right Calcaneus. The wound measures 6.4cm length x 3.2cm width x 0.1cm depth; 16.085cm^2 area and 1.608cm^3 volume. There is a medium amount of serosanguineous drainage noted. Assessment Active Problems ICD-10 Non-pressure chronic ulcer of other part of left lower leg with fat layer exposed Non-pressure chronic ulcer of other part of right lower leg with fat layer exposed Chronic venous hypertension (idiopathic) with ulcer of left lower extremity Lymphedema, not elsewhere classified Type 2 diabetes mellitus with other skin ulcer Pressure ulcer of right heel, unstageable Pressure ulcer of right ankle, stage 3 Pressure ulcer of left heel, unstageable Other acute osteomyelitis, left ankle and foot Pressure ulcer of right hip, stage 4 Pressure ulcer of left hip, unstageable Chronic diastolic (congestive) heart failure Long term (current) use of oral hypoglycemic drugs Procedures Wound #6 Pre-procedure diagnosis of Wound #6 is a Pressure Ulcer located on the Right Calcaneus . There was a Excisional Skin/Subcutaneous Tissue Debridement with a total area of 8.04 sq cm performed by Bethena Ferraris, PA-C. With the following instrument(s): Curette to remove Viable and Non-Viable tissue/material. Material removed includes Subcutaneous Tissue and Slough and. A time out was conducted at 17:04, prior to the start of the procedure. A Minimum amount of bleeding Windholz, Surie [name] ([id number]) [phone].pdf Page 13 of 15 was controlled with Pressure. The procedure was tolerated well. Post  Debridement Measurements: 6.4cm length x 3.2cm width x 0.1cm depth; 1.608cm^3 volume. Post debridement Stage noted as Category/Stage III. Character of Wound/Ulcer Post Debridement is stable. Post procedure Diagnosis Wound #6: Same as Pre-Procedure Plan Follow-up Appointments: Return Appointment in 2 weeks. Home Health: Home Health Company: - Arlina Fonder, RN [phone] [facility] for wound care. May utilize formulary equivalent dressing for wound treatment orders unless otherwise specified. Home Health Nurse may visit PRN to address patients wound care needs. Off-Loading: Low air-loss mattress (Group  2) WOUND #11: - Trochanter Wound Laterality: Right Cleanser: Vashe 5.8 (oz) 1 x Per Day/30 Days Discharge Instructions: Use as directed for cleaning wound Prim Dressing: Prisma 4.34 (in) 1 x Per Day/30 Days ary Discharge Instructions: pack into tunnel and wound Secondary Dressing: Gauze 1 x Per Day/30 Days Discharge Instructions: moisten with Vashe and place over collagen Secondary Dressing: (BORDER) Zetuvit Plus SILICONE BORDER Dressing 5x5 (in/in) 1 x Per Day/30 Days Discharge Instructions: Please do not put silicone bordered dressings under wraps. Use non-bordered dressing only. WOUND #13: - Calcaneus Wound Laterality: Left Cleanser: Vashe 5.8 (oz) (Home Health) 1 x Per Day/30 Days Discharge Instructions: damp to dry daily Topical: Betadine  1 x Per Day/30 Days Discharge Instructions: Apply betadine  to heel Secondary Dressing: ABD Pad 5x9 (in/in) 1 x Per Day/30 Days Discharge Instructions: Cover with ABD pad Secondary Dressing: Kerlix 4.5 x 4.1 (in/yd) 1 x Per Day/30 Days Discharge Instructions: Apply Kerlix 4.5 x 4.1 (in/yd) as instructed Secured With: ACE WRAP - 76M ACE Elastic Bandage With VELCRO Brand Closure, 4 (in) 1 x Per Day/30 Days WOUND #14: - Lower Leg Wound Laterality: Right, Medial, Distal Cleanser: Vashe 5.8 (oz) (Home Health) 1 x Per Day/30  Days Discharge Instructions: damp to dry daily Prim Dressing: Xeroform 5x9-HBD (in/in) 1 x Per Day/30 Days ary Discharge Instructions: Apply Xeroform 5x9-HBD (in/in) as directed Secondary Dressing: ABD Pad 5x9 (in/in) 1 x Per Day/30 Days Discharge Instructions: Cover with ABD pad Secondary Dressing: Kerlix 4.5 x 4.1 (in/yd) 1 x Per Day/30 Days Discharge Instructions: Apply Kerlix 4.5 x 4.1 (in/yd) as instructed Secured With: ACE WRAP - 76M ACE Elastic Bandage With VELCRO Brand Closure, 4 (in) 1 x Per Day/30 Days WOUND #15: - Lower Leg Wound Laterality: Left, Anterior Cleanser: Vashe 5.8 (oz) (Home Health) 1 x Per Day/30 Days Discharge Instructions: damp to dry daily Prim Dressing: Xeroform 5x9-HBD (in/in) 1 x Per Day/30 Days ary Discharge Instructions: Apply Xeroform 5x9-HBD (in/in) as directed Secondary Dressing: ABD Pad 5x9 (in/in) 1 x Per Day/30 Days Discharge Instructions: Cover with ABD pad Secondary Dressing: Kerlix 4.5 x 4.1 (in/yd) 1 x Per Day/30 Days Discharge Instructions: Apply Kerlix 4.5 x 4.1 (in/yd) as instructed Secured With: ACE WRAP - 76M ACE Elastic Bandage With VELCRO Brand Closure, 4 (in) 1 x Per Day/30 Days WOUND #16: - Trochanter Wound Laterality: Left Cleanser: Vashe 5.8 (oz) 1 x Per Day/30 Days Discharge Instructions: Use as directed for cleaning wound Prim Dressing: Prisma 4.34 (in) 1 x Per Day/30 Days ary Discharge Instructions: pack into tunnel and wound Secondary Dressing: Gauze 1 x Per Day/30 Days Discharge Instructions: moisten with Vashe and place over collagen Secondary Dressing: (BORDER) Zetuvit Plus SILICONE BORDER Dressing 5x5 (in/in) 1 x Per Day/30 Days Discharge Instructions: Please do not put silicone bordered dressings under wraps. Use non-bordered dressing only. WOUND #18: - Metatarsal head first Wound Laterality: Right Cleanser: Vashe 5.8 (oz) (Home Health) 1 x Per Day/30 Days Discharge Instructions: damp to dry daily Prim Dressing: Xeroform  5x9-HBD (in/in) 1 x Per Day/30 Days ary Discharge Instructions: Apply Xeroform 5x9-HBD (in/in) as directed Secondary Dressing: ABD Pad 5x9 (in/in) 1 x Per Day/30 Days Discharge Instructions: Cover with ABD pad Secondary Dressing: Kerlix 4.5 x 4.1 (in/yd) 1 x Per Day/30 Days Discharge Instructions: Apply Kerlix 4.5 x 4.1 (in/yd) as instructed Secured With: ACE WRAP - 76M ACE Elastic Bandage With VELCRO Brand Closure, 4 (in) 1 x Per Day/30 Days WOUND #19: - Lower Leg Wound Laterality: Right, Anterior Cleanser:  Vashe 5.8 (oz) (Home Health) 1 x Per Day/30 Days Discharge Instructions: damp to dry daily Prim Dressing: Xeroform 5x9-HBD (in/in) 1 x Per Day/30 Days ary Discharge Instructions: Apply Xeroform 5x9-HBD (in/in) as directed Secondary Dressing: ABD Pad 5x9 (in/in) 1 x Per Day/30 Days Discharge Instructions: Cover with ABD pad Secondary Dressing: Kerlix 4.5 x 4.1 (in/yd) 1 x Per Day/30 Days Discharge Instructions: Apply Kerlix 4.5 x 4.1 (in/yd) as instructed Secured With: ACE WRAP - 38M ACE Elastic Bandage With VELCRO Brand Closure, 4 (in) 1 x Per Day/30 Days WOUND #20: - Gluteal fold Wound Laterality: Right Cleanser: Soap and Water 1 x Per Day/30 Days Discharge Instructions: Gently cleanse wound with antibacterial soap, rinse and pat dry prior to dressing wounds [name], Georgianne [name] ([id number]) [id number] Page 14 of 15 Prim Dressing: Xeroform-HBD 2x2 (in/in) 1 x Per Day/30 Days ary Discharge Instructions: Apply Xeroform-HBD 2x2 (in/in) as directed Secondary Dressing: (BORDER) Zetuvit Plus SILICONE BORDER Dressing 4x4 (in/in) 1 x Per Day/30 Days Discharge Instructions: Please do not put silicone bordered dressings under wraps. Use non-bordered dressing only. WOUND #6: - Calcaneus Wound Laterality: Right Cleanser: Vashe 5.8 (oz) (Home Health) 1 x Per Day/30 Days Discharge Instructions: damp to dry daily Prim Dressing: Xeroform 5x9-HBD (in/in) 1 x Per Day/30  Days ary Discharge Instructions: Apply Xeroform 5x9-HBD (in/in) as directed Secondary Dressing: ABD Pad 5x9 (in/in) 1 x Per Day/30 Days Discharge Instructions: Cover with ABD pad Secondary Dressing: Kerlix 4.5 x 4.1 (in/yd) 1 x Per Day/30 Days Discharge Instructions: Apply Kerlix 4.5 x 4.1 (in/yd) as instructed Secured With: ACE WRAP - 38M ACE Elastic Bandage With VELCRO Brand Closure, 4 (in) 1 x Per Day/30 Days 1. I would recommend that we have the patient continue with the wound care measures as before. We on the first metatarsal head medially on the right leg to get a continue with the use of Betadine . With regard to the other wounds on the right trochanter we are gena continue with the Prisma we will try to pack this and as deeply as we can. 2. With regard to the left heel we are using Betadine  over this area. The right lower leg where using Xeroform with ABD pad as well as the left lower leg. With regard to the left trochanter were also using Prisma here. 3. With regard to the right anterior lower leg where using Xeroform and the right gluteal fold Xeroform as well. The right heel were using Xeroform and an ABD pad. 4. I do believe is good to be imperative the patient continue with appropriate offloading I discussed that with her and her son today as well. We will see patient back for reevaluation in 1 week here in the clinic. If anything worsens or changes patient will contact our office for additional recommendations. Electronic Signature(s) Signed: 11/20/2023 6:09:48 PM By: Bethena Ferraris PA-C Entered By: Bethena Ferraris on 11/20/2023 15:09:48 -------------------------------------------------------------------------------- SuperBill Details Patient Name: Date of Service: MCCO RMICK, [name] 11/20/2023 Medical Record Number: [id number] Patient Account Number: [id number] Date of Birth/Sex: Treating RN: 01/04/1941 (83 y.o. [name] Primary Care Provider: Sadie Manna Other  Clinician: Purcell Sniff Referring Provider: Treating Provider/Extender: Bethena Ferraris Sadie Manna Weeks in Treatment: 27 Diagnosis Coding ICD-10 Codes Code Description [phone] Non-pressure chronic ulcer of other part of left lower leg with fat layer exposed L97.812 Non-pressure chronic ulcer of other part of right lower leg with fat layer exposed I87.312 Chronic venous hypertension (idiopathic) with ulcer of left lower extremity I89.0 Lymphedema,  not elsewhere classified E11.622 Type 2 diabetes mellitus with other skin ulcer L89.610 Pressure ulcer of right heel, unstageable L89.513 Pressure ulcer of right ankle, stage 3 L89.620 Pressure ulcer of left heel, unstageable M86.172 Other acute osteomyelitis, left ankle and foot L89.214 Pressure ulcer of right hip, stage 4 L89.220 Pressure ulcer of left hip, unstageable I50.32 Chronic diastolic (congestive) heart failure Z79.84 Long term (current) use of oral hypoglycemic drugs Giacobbe, Ariyanna [name] ([id number]) [phone].pdf Page 15 of 15 Facility Procedures : CPT4 Code: [id number] Description: 11042 - DEB SUBQ TISSUE 20 SQ CM/< ICD-10 Diagnosis Description L89.610 Pressure ulcer of right heel, unstageable Modifier: Quantity: 1 Physician Procedures : CPT4 Code Description Modifier [id number] - WC PHYS LEVEL 3 - EST PT 25 ICD-10 Diagnosis Description L97.822 Non-pressure chronic ulcer of other part of left lower leg with fat layer exposed L97.812 Non-pressure chronic ulcer of other part of right  lower leg with fat layer exposed I87.312 Chronic venous hypertension (idiopathic) with ulcer of left lower extremity I89.0 Lymphedema, not elsewhere classified Quantity: 1 : 11042 11042 - WC PHYS SUBQ TISS 20 SQ CM ICD-10 Diagnosis Description L89.610 Pressure ulcer of right heel, unstageable Quantity: 1 Electronic Signature(s) Signed: 11/20/2023 6:10:18 PM By: Bethena Ferraris PA-C Entered By: Bethena Ferraris on 11/20/2023  15:10:16

## 2023-11-26 ENCOUNTER — Ambulatory Visit: Payer: PPO | Admitting: Infectious Diseases

## 2023-11-29 ENCOUNTER — Ambulatory Visit: Payer: PPO | Admitting: Physician Assistant

## 2023-11-29 ENCOUNTER — Encounter: Payer: Self-pay | Admitting: Physician Assistant

## 2023-11-29 DIAGNOSIS — B3731 Acute candidiasis of vulva and vagina: Secondary | ICD-10-CM

## 2023-11-29 DIAGNOSIS — R339 Retention of urine, unspecified: Secondary | ICD-10-CM

## 2023-11-29 DIAGNOSIS — Z466 Encounter for fitting and adjustment of urinary device: Secondary | ICD-10-CM

## 2023-11-29 MED ORDER — KETOCONAZOLE 2 % EX CREA: TOPICAL_CREAM | CUTANEOUS | 1 refills | Status: AC

## 2023-11-29 NOTE — Progress Notes (Signed)
Cath Change/ Replacement  Patient is present today for a catheter change due to urinary retention.  17ml of water was removed from the balloon, a 16FR foley cath was removed without difficulty.  Patient was cleaned and prepped in a sterile fashion with betadine. A 16 FR foley cath was replaced into the bladder, no complications were noted. Urine return was noted 30ml and urine was clear in color. The balloon was filled with 20ml of sterile water. A night bag was attached for drainage.  Patient tolerated well.    Performed by: Carman Ching, PA-C   Additional notes: She reports vulvovaginal irritation. On physical exam, erythema of the vulva and inguinal folds c/w yeast. Sending in topical antifungal.  Follow up: Return in about 4 weeks (around 12/27/2023) for Catheter exchange.

## 2023-12-04 ENCOUNTER — Encounter: Payer: PPO | Admitting: Physician Assistant

## 2023-12-04 DIAGNOSIS — E119 Type 2 diabetes mellitus without complications: Secondary | ICD-10-CM | POA: Diagnosis not present

## 2023-12-04 DIAGNOSIS — L89623 Pressure ulcer of left heel, stage 3: Secondary | ICD-10-CM | POA: Diagnosis not present

## 2023-12-04 DIAGNOSIS — L89890 Pressure ulcer of other site, unstageable: Secondary | ICD-10-CM | POA: Diagnosis not present

## 2023-12-04 DIAGNOSIS — L89214 Pressure ulcer of right hip, stage 4: Secondary | ICD-10-CM | POA: Diagnosis not present

## 2023-12-04 DIAGNOSIS — L89220 Pressure ulcer of left hip, unstageable: Secondary | ICD-10-CM | POA: Diagnosis not present

## 2023-12-04 DIAGNOSIS — L89613 Pressure ulcer of right heel, stage 3: Secondary | ICD-10-CM | POA: Diagnosis not present

## 2023-12-04 DIAGNOSIS — L8990 Pressure ulcer of unspecified site, unspecified stage: Secondary | ICD-10-CM | POA: Diagnosis not present

## 2023-12-04 DIAGNOSIS — L988 Other specified disorders of the skin and subcutaneous tissue: Secondary | ICD-10-CM | POA: Diagnosis not present

## 2023-12-04 DIAGNOSIS — L89620 Pressure ulcer of left heel, unstageable: Secondary | ICD-10-CM | POA: Diagnosis not present

## 2023-12-04 DIAGNOSIS — I2510 Atherosclerotic heart disease of native coronary artery without angina pectoris: Secondary | ICD-10-CM | POA: Diagnosis not present

## 2023-12-04 DIAGNOSIS — I872 Venous insufficiency (chronic) (peripheral): Secondary | ICD-10-CM | POA: Diagnosis not present

## 2023-12-04 DIAGNOSIS — I87312 Chronic venous hypertension (idiopathic) with ulcer of left lower extremity: Secondary | ICD-10-CM | POA: Diagnosis not present

## 2023-12-09 DIAGNOSIS — L89623 Pressure ulcer of left heel, stage 3: Secondary | ICD-10-CM | POA: Diagnosis not present

## 2023-12-09 DIAGNOSIS — L89890 Pressure ulcer of other site, unstageable: Secondary | ICD-10-CM | POA: Diagnosis not present

## 2023-12-09 DIAGNOSIS — L89613 Pressure ulcer of right heel, stage 3: Secondary | ICD-10-CM | POA: Diagnosis not present

## 2023-12-09 DIAGNOSIS — L89894 Pressure ulcer of other site, stage 4: Secondary | ICD-10-CM | POA: Diagnosis not present

## 2023-12-18 ENCOUNTER — Ambulatory Visit: Payer: PPO | Admitting: Physician Assistant

## 2023-12-25 DIAGNOSIS — L89894 Pressure ulcer of other site, stage 4: Secondary | ICD-10-CM | POA: Diagnosis not present

## 2023-12-25 DIAGNOSIS — L89890 Pressure ulcer of other site, unstageable: Secondary | ICD-10-CM | POA: Diagnosis not present

## 2023-12-25 DIAGNOSIS — L89623 Pressure ulcer of left heel, stage 3: Secondary | ICD-10-CM | POA: Diagnosis not present

## 2023-12-25 DIAGNOSIS — L89613 Pressure ulcer of right heel, stage 3: Secondary | ICD-10-CM | POA: Diagnosis not present

## 2023-12-26 DIAGNOSIS — R197 Diarrhea, unspecified: Secondary | ICD-10-CM | POA: Diagnosis not present

## 2023-12-30 ENCOUNTER — Ambulatory Visit (INDEPENDENT_AMBULATORY_CARE_PROVIDER_SITE_OTHER): Payer: PPO | Admitting: Physician Assistant

## 2023-12-30 VITALS — BP 113/72 | HR 99 | Ht 59.0 in | Wt 116.0 lb

## 2023-12-30 DIAGNOSIS — R339 Retention of urine, unspecified: Secondary | ICD-10-CM

## 2023-12-30 DIAGNOSIS — Z466 Encounter for fitting and adjustment of urinary device: Secondary | ICD-10-CM

## 2023-12-30 MED ORDER — FLUCONAZOLE 150 MG PO TABS
150.0000 mg | ORAL_TABLET | Freq: Once | ORAL | 0 refills | Status: AC
Start: 2023-12-30 — End: 2023-12-30

## 2023-12-30 NOTE — Progress Notes (Signed)
Cath Change/ Replacement  Patient is present today for a catheter change due to urinary retention.  19ml of water was removed from the balloon, a 16FR foley cath was removed without difficulty.  Patient was cleaned and prepped in a sterile fashion with betadine. A 16 FR foley cath was replaced into the bladder, no complications were noted. Urine return was noted 5ml and urine was yellow in color. The balloon was filled with 10ml of sterile water. A night bag was attached for drainage.  Patient tolerated well.    Performed by: Carman Ching, PA-C and Domingo Cocking, CMA  Additional notes: Topical antifungals used with some improvement, but not resolved. Will rx Diflucan 150mg  x1 dose.  Follow up: Return in about 4 weeks (around 01/27/2024) for Catheter exchange.

## 2024-01-01 ENCOUNTER — Ambulatory Visit: Payer: PPO | Admitting: Physician Assistant

## 2024-01-13 DIAGNOSIS — M5416 Radiculopathy, lumbar region: Secondary | ICD-10-CM | POA: Diagnosis not present

## 2024-01-13 DIAGNOSIS — M5412 Radiculopathy, cervical region: Secondary | ICD-10-CM | POA: Diagnosis not present

## 2024-01-13 DIAGNOSIS — Z79899 Other long term (current) drug therapy: Secondary | ICD-10-CM | POA: Diagnosis not present

## 2024-01-13 DIAGNOSIS — M5020 Other cervical disc displacement, unspecified cervical region: Secondary | ICD-10-CM | POA: Diagnosis not present

## 2024-01-13 DIAGNOSIS — M48062 Spinal stenosis, lumbar region with neurogenic claudication: Secondary | ICD-10-CM | POA: Diagnosis not present

## 2024-01-13 DIAGNOSIS — M4802 Spinal stenosis, cervical region: Secondary | ICD-10-CM | POA: Diagnosis not present

## 2024-01-15 ENCOUNTER — Encounter: Payer: PPO | Attending: Physician Assistant | Admitting: Physician Assistant

## 2024-01-15 DIAGNOSIS — L89610 Pressure ulcer of right heel, unstageable: Secondary | ICD-10-CM | POA: Diagnosis not present

## 2024-01-15 DIAGNOSIS — L84 Corns and callosities: Secondary | ICD-10-CM | POA: Diagnosis not present

## 2024-01-15 DIAGNOSIS — L97822 Non-pressure chronic ulcer of other part of left lower leg with fat layer exposed: Secondary | ICD-10-CM | POA: Insufficient documentation

## 2024-01-15 DIAGNOSIS — Z7984 Long term (current) use of oral hypoglycemic drugs: Secondary | ICD-10-CM | POA: Insufficient documentation

## 2024-01-15 DIAGNOSIS — L89513 Pressure ulcer of right ankle, stage 3: Secondary | ICD-10-CM | POA: Diagnosis not present

## 2024-01-15 DIAGNOSIS — I5032 Chronic diastolic (congestive) heart failure: Secondary | ICD-10-CM | POA: Diagnosis not present

## 2024-01-15 DIAGNOSIS — M86172 Other acute osteomyelitis, left ankle and foot: Secondary | ICD-10-CM | POA: Diagnosis not present

## 2024-01-15 DIAGNOSIS — L89214 Pressure ulcer of right hip, stage 4: Secondary | ICD-10-CM | POA: Insufficient documentation

## 2024-01-15 DIAGNOSIS — L97812 Non-pressure chronic ulcer of other part of right lower leg with fat layer exposed: Secondary | ICD-10-CM | POA: Insufficient documentation

## 2024-01-15 DIAGNOSIS — L89620 Pressure ulcer of left heel, unstageable: Secondary | ICD-10-CM | POA: Insufficient documentation

## 2024-01-15 DIAGNOSIS — E11622 Type 2 diabetes mellitus with other skin ulcer: Secondary | ICD-10-CM | POA: Insufficient documentation

## 2024-01-15 DIAGNOSIS — I87312 Chronic venous hypertension (idiopathic) with ulcer of left lower extremity: Secondary | ICD-10-CM | POA: Diagnosis not present

## 2024-01-15 DIAGNOSIS — L89220 Pressure ulcer of left hip, unstageable: Secondary | ICD-10-CM | POA: Diagnosis not present

## 2024-01-15 DIAGNOSIS — I872 Venous insufficiency (chronic) (peripheral): Secondary | ICD-10-CM | POA: Diagnosis not present

## 2024-01-16 DIAGNOSIS — L97519 Non-pressure chronic ulcer of other part of right foot with unspecified severity: Secondary | ICD-10-CM | POA: Diagnosis not present

## 2024-01-27 ENCOUNTER — Ambulatory Visit: Payer: PPO | Admitting: Physician Assistant

## 2024-02-03 ENCOUNTER — Ambulatory Visit: Admitting: Physician Assistant

## 2024-02-03 DIAGNOSIS — Z466 Encounter for fitting and adjustment of urinary device: Secondary | ICD-10-CM | POA: Diagnosis not present

## 2024-02-03 NOTE — Progress Notes (Signed)
 Cath Change/ Replacement  Patient is present today for a catheter change due to urinary retention.  20ml of water was removed from the balloon, a 16FR foley cath was removed without difficulty.  Patient was cleaned and prepped in a sterile fashion with betadine. A 16 FR foley cath was replaced into the bladder, no complications were noted. Urine return was noted 10ml and urine was clear in color. The balloon was filled with 10ml of sterile water. A night bag was attached for drainage.  Patient tolerated well.    Performed by: Carman Ching, PA-C and Ples Specter, CMA  Follow up: Return in about 4 weeks (around 03/02/2024) for Catheter exchange.

## 2024-02-05 ENCOUNTER — Encounter: Admitting: Internal Medicine

## 2024-02-05 DIAGNOSIS — I87312 Chronic venous hypertension (idiopathic) with ulcer of left lower extremity: Secondary | ICD-10-CM | POA: Diagnosis not present

## 2024-02-14 DIAGNOSIS — E1165 Type 2 diabetes mellitus with hyperglycemia: Secondary | ICD-10-CM | POA: Diagnosis not present

## 2024-02-14 DIAGNOSIS — Z978 Presence of other specified devices: Secondary | ICD-10-CM | POA: Diagnosis not present

## 2024-02-14 DIAGNOSIS — M1A9XX0 Chronic gout, unspecified, without tophus (tophi): Secondary | ICD-10-CM | POA: Diagnosis not present

## 2024-02-14 DIAGNOSIS — E8809 Other disorders of plasma-protein metabolism, not elsewhere classified: Secondary | ICD-10-CM | POA: Diagnosis not present

## 2024-02-14 DIAGNOSIS — E538 Deficiency of other specified B group vitamins: Secondary | ICD-10-CM | POA: Diagnosis not present

## 2024-02-14 DIAGNOSIS — M1712 Unilateral primary osteoarthritis, left knee: Secondary | ICD-10-CM | POA: Diagnosis not present

## 2024-02-14 DIAGNOSIS — Z9011 Acquired absence of right breast and nipple: Secondary | ICD-10-CM | POA: Diagnosis not present

## 2024-02-14 DIAGNOSIS — I10 Essential (primary) hypertension: Secondary | ICD-10-CM | POA: Diagnosis not present

## 2024-02-14 DIAGNOSIS — E790 Hyperuricemia without signs of inflammatory arthritis and tophaceous disease: Secondary | ICD-10-CM | POA: Diagnosis not present

## 2024-02-14 DIAGNOSIS — R262 Difficulty in walking, not elsewhere classified: Secondary | ICD-10-CM | POA: Diagnosis not present

## 2024-02-14 DIAGNOSIS — R531 Weakness: Secondary | ICD-10-CM | POA: Diagnosis not present

## 2024-02-14 DIAGNOSIS — R197 Diarrhea, unspecified: Secondary | ICD-10-CM | POA: Diagnosis not present

## 2024-02-14 DIAGNOSIS — Z8739 Personal history of other diseases of the musculoskeletal system and connective tissue: Secondary | ICD-10-CM | POA: Diagnosis not present

## 2024-02-14 DIAGNOSIS — L97311 Non-pressure chronic ulcer of right ankle limited to breakdown of skin: Secondary | ICD-10-CM | POA: Diagnosis not present

## 2024-02-14 DIAGNOSIS — D649 Anemia, unspecified: Secondary | ICD-10-CM | POA: Diagnosis not present

## 2024-02-14 DIAGNOSIS — L89899 Pressure ulcer of other site, unspecified stage: Secondary | ICD-10-CM | POA: Diagnosis not present

## 2024-02-14 DIAGNOSIS — E039 Hypothyroidism, unspecified: Secondary | ICD-10-CM | POA: Diagnosis not present

## 2024-02-14 DIAGNOSIS — M8580 Other specified disorders of bone density and structure, unspecified site: Secondary | ICD-10-CM | POA: Diagnosis not present

## 2024-02-14 DIAGNOSIS — Z87442 Personal history of urinary calculi: Secondary | ICD-10-CM | POA: Diagnosis not present

## 2024-02-14 DIAGNOSIS — R7309 Other abnormal glucose: Secondary | ICD-10-CM | POA: Diagnosis not present

## 2024-02-26 ENCOUNTER — Encounter: Attending: Physician Assistant | Admitting: Physician Assistant

## 2024-02-26 DIAGNOSIS — E11622 Type 2 diabetes mellitus with other skin ulcer: Secondary | ICD-10-CM | POA: Insufficient documentation

## 2024-02-26 DIAGNOSIS — L97812 Non-pressure chronic ulcer of other part of right lower leg with fat layer exposed: Secondary | ICD-10-CM | POA: Diagnosis not present

## 2024-02-26 DIAGNOSIS — L89620 Pressure ulcer of left heel, unstageable: Secondary | ICD-10-CM | POA: Insufficient documentation

## 2024-02-26 DIAGNOSIS — L89513 Pressure ulcer of right ankle, stage 3: Secondary | ICD-10-CM | POA: Insufficient documentation

## 2024-02-26 DIAGNOSIS — L89214 Pressure ulcer of right hip, stage 4: Secondary | ICD-10-CM | POA: Insufficient documentation

## 2024-02-26 DIAGNOSIS — L89220 Pressure ulcer of left hip, unstageable: Secondary | ICD-10-CM | POA: Insufficient documentation

## 2024-02-26 DIAGNOSIS — L84 Corns and callosities: Secondary | ICD-10-CM | POA: Diagnosis not present

## 2024-02-26 DIAGNOSIS — I890 Lymphedema, not elsewhere classified: Secondary | ICD-10-CM | POA: Insufficient documentation

## 2024-02-26 DIAGNOSIS — I87312 Chronic venous hypertension (idiopathic) with ulcer of left lower extremity: Secondary | ICD-10-CM | POA: Insufficient documentation

## 2024-02-26 DIAGNOSIS — L97822 Non-pressure chronic ulcer of other part of left lower leg with fat layer exposed: Secondary | ICD-10-CM | POA: Diagnosis not present

## 2024-02-26 DIAGNOSIS — L89610 Pressure ulcer of right heel, unstageable: Secondary | ICD-10-CM | POA: Insufficient documentation

## 2024-02-26 DIAGNOSIS — M86172 Other acute osteomyelitis, left ankle and foot: Secondary | ICD-10-CM | POA: Diagnosis not present

## 2024-02-26 DIAGNOSIS — Z7984 Long term (current) use of oral hypoglycemic drugs: Secondary | ICD-10-CM | POA: Diagnosis not present

## 2024-02-26 DIAGNOSIS — I5032 Chronic diastolic (congestive) heart failure: Secondary | ICD-10-CM | POA: Diagnosis not present

## 2024-02-26 DIAGNOSIS — L89623 Pressure ulcer of left heel, stage 3: Secondary | ICD-10-CM | POA: Diagnosis not present

## 2024-03-03 DIAGNOSIS — L97519 Non-pressure chronic ulcer of other part of right foot with unspecified severity: Secondary | ICD-10-CM | POA: Diagnosis not present

## 2024-03-05 ENCOUNTER — Ambulatory Visit: Admitting: Physician Assistant

## 2024-03-05 DIAGNOSIS — Z466 Encounter for fitting and adjustment of urinary device: Secondary | ICD-10-CM

## 2024-03-05 NOTE — Progress Notes (Signed)
 Cath Change/ Replacement  Patient is present today for a catheter change due to urinary retention.  18ml of water was removed from the balloon, a 16FR foley cath was removed without difficulty.  Patient was cleaned and prepped in a sterile fashion with betadine . A 16 FR foley cath was replaced into the bladder, no complications were noted. Urine return was noted 20ml and urine was clear in color. The balloon was filled with 10ml of sterile water. A night bag was attached for drainage.  Patient tolerated well.    Performed by: Kathreen Pare, PA-C   Follow up: Return in about 4 weeks (around 04/02/2024) for Catheter exchange.

## 2024-03-10 ENCOUNTER — Telehealth: Payer: Self-pay

## 2024-03-10 DIAGNOSIS — B3731 Acute candidiasis of vulva and vagina: Secondary | ICD-10-CM

## 2024-03-10 NOTE — Telephone Encounter (Signed)
 Patient is calling requesting yeast medications.  She says she is red and itchy.  She has used the pill or ointment in the past.  [employer].  Please advise.

## 2024-03-11 MED ORDER — FLUCONAZOLE 150 MG PO TABS: 150.0000 mg | ORAL_TABLET | Freq: Once | ORAL | 0 refills | Status: AC

## 2024-03-11 NOTE — Telephone Encounter (Signed)
 LVM informing pt that medication was being sent to pharmacy.

## 2024-03-11 NOTE — Telephone Encounter (Signed)
Diflucan 150 mg x 1 dose sent to her pharmacy.

## 2024-03-13 NOTE — Telephone Encounter (Signed)
 LVM informing pt that we sent a medication to her pharmacy.

## 2024-03-18 ENCOUNTER — Encounter: Attending: Physician Assistant | Admitting: Physician Assistant

## 2024-03-18 DIAGNOSIS — Z853 Personal history of malignant neoplasm of breast: Secondary | ICD-10-CM | POA: Insufficient documentation

## 2024-03-18 DIAGNOSIS — E11622 Type 2 diabetes mellitus with other skin ulcer: Secondary | ICD-10-CM | POA: Insufficient documentation

## 2024-03-18 DIAGNOSIS — I10 Essential (primary) hypertension: Secondary | ICD-10-CM | POA: Diagnosis not present

## 2024-03-18 DIAGNOSIS — Z7984 Long term (current) use of oral hypoglycemic drugs: Secondary | ICD-10-CM | POA: Diagnosis not present

## 2024-03-18 DIAGNOSIS — L89220 Pressure ulcer of left hip, unstageable: Secondary | ICD-10-CM | POA: Diagnosis not present

## 2024-03-18 DIAGNOSIS — L89610 Pressure ulcer of right heel, unstageable: Secondary | ICD-10-CM | POA: Diagnosis not present

## 2024-03-18 DIAGNOSIS — L89513 Pressure ulcer of right ankle, stage 3: Secondary | ICD-10-CM | POA: Diagnosis not present

## 2024-03-18 DIAGNOSIS — Z978 Presence of other specified devices: Secondary | ICD-10-CM | POA: Diagnosis not present

## 2024-03-18 DIAGNOSIS — M86172 Other acute osteomyelitis, left ankle and foot: Secondary | ICD-10-CM | POA: Diagnosis not present

## 2024-03-18 DIAGNOSIS — I87312 Chronic venous hypertension (idiopathic) with ulcer of left lower extremity: Secondary | ICD-10-CM | POA: Insufficient documentation

## 2024-03-18 DIAGNOSIS — E538 Deficiency of other specified B group vitamins: Secondary | ICD-10-CM | POA: Diagnosis not present

## 2024-03-18 DIAGNOSIS — L89214 Pressure ulcer of right hip, stage 4: Secondary | ICD-10-CM | POA: Insufficient documentation

## 2024-03-18 DIAGNOSIS — L89613 Pressure ulcer of right heel, stage 3: Secondary | ICD-10-CM | POA: Diagnosis not present

## 2024-03-18 DIAGNOSIS — E1165 Type 2 diabetes mellitus with hyperglycemia: Secondary | ICD-10-CM | POA: Diagnosis not present

## 2024-03-18 DIAGNOSIS — L84 Corns and callosities: Secondary | ICD-10-CM | POA: Diagnosis not present

## 2024-03-18 DIAGNOSIS — Z9011 Acquired absence of right breast and nipple: Secondary | ICD-10-CM | POA: Diagnosis not present

## 2024-03-18 DIAGNOSIS — L97822 Non-pressure chronic ulcer of other part of left lower leg with fat layer exposed: Secondary | ICD-10-CM | POA: Insufficient documentation

## 2024-03-18 DIAGNOSIS — I890 Lymphedema, not elsewhere classified: Secondary | ICD-10-CM | POA: Insufficient documentation

## 2024-03-18 DIAGNOSIS — I5032 Chronic diastolic (congestive) heart failure: Secondary | ICD-10-CM | POA: Diagnosis not present

## 2024-03-18 DIAGNOSIS — L97502 Non-pressure chronic ulcer of other part of unspecified foot with fat layer exposed: Secondary | ICD-10-CM | POA: Diagnosis not present

## 2024-03-18 DIAGNOSIS — L89623 Pressure ulcer of left heel, stage 3: Secondary | ICD-10-CM | POA: Diagnosis not present

## 2024-03-18 DIAGNOSIS — C50011 Malignant neoplasm of nipple and areola, right female breast: Secondary | ICD-10-CM | POA: Diagnosis not present

## 2024-03-18 DIAGNOSIS — E1169 Type 2 diabetes mellitus with other specified complication: Secondary | ICD-10-CM | POA: Insufficient documentation

## 2024-03-18 DIAGNOSIS — Z0000 Encounter for general adult medical examination without abnormal findings: Secondary | ICD-10-CM | POA: Diagnosis not present

## 2024-03-18 DIAGNOSIS — L89620 Pressure ulcer of left heel, unstageable: Secondary | ICD-10-CM | POA: Insufficient documentation

## 2024-03-18 DIAGNOSIS — D649 Anemia, unspecified: Secondary | ICD-10-CM | POA: Diagnosis not present

## 2024-03-18 DIAGNOSIS — L97812 Non-pressure chronic ulcer of other part of right lower leg with fat layer exposed: Secondary | ICD-10-CM | POA: Diagnosis not present

## 2024-03-18 DIAGNOSIS — E039 Hypothyroidism, unspecified: Secondary | ICD-10-CM | POA: Diagnosis not present

## 2024-03-23 DIAGNOSIS — I83019 Varicose veins of right lower extremity with ulcer of unspecified site: Secondary | ICD-10-CM | POA: Diagnosis not present

## 2024-03-23 DIAGNOSIS — B351 Tinea unguium: Secondary | ICD-10-CM | POA: Diagnosis not present

## 2024-03-23 DIAGNOSIS — M79674 Pain in right toe(s): Secondary | ICD-10-CM | POA: Diagnosis not present

## 2024-03-23 DIAGNOSIS — M79675 Pain in left toe(s): Secondary | ICD-10-CM | POA: Diagnosis not present

## 2024-03-23 DIAGNOSIS — L03115 Cellulitis of right lower limb: Secondary | ICD-10-CM | POA: Diagnosis not present

## 2024-03-23 DIAGNOSIS — I872 Venous insufficiency (chronic) (peripheral): Secondary | ICD-10-CM | POA: Diagnosis not present

## 2024-03-23 DIAGNOSIS — L97919 Non-pressure chronic ulcer of unspecified part of right lower leg with unspecified severity: Secondary | ICD-10-CM | POA: Diagnosis not present

## 2024-04-03 ENCOUNTER — Ambulatory Visit: Admitting: Physician Assistant

## 2024-04-08 ENCOUNTER — Encounter: Admitting: Physician Assistant

## 2024-04-08 DIAGNOSIS — L97812 Non-pressure chronic ulcer of other part of right lower leg with fat layer exposed: Secondary | ICD-10-CM | POA: Diagnosis not present

## 2024-04-08 DIAGNOSIS — L89214 Pressure ulcer of right hip, stage 4: Secondary | ICD-10-CM | POA: Diagnosis not present

## 2024-04-08 DIAGNOSIS — I87312 Chronic venous hypertension (idiopathic) with ulcer of left lower extremity: Secondary | ICD-10-CM | POA: Diagnosis not present

## 2024-04-08 DIAGNOSIS — L89220 Pressure ulcer of left hip, unstageable: Secondary | ICD-10-CM | POA: Diagnosis not present

## 2024-04-08 DIAGNOSIS — L89310 Pressure ulcer of right buttock, unstageable: Secondary | ICD-10-CM | POA: Diagnosis not present

## 2024-04-09 ENCOUNTER — Ambulatory Visit: Admitting: Physician Assistant

## 2024-04-14 DIAGNOSIS — M48062 Spinal stenosis, lumbar region with neurogenic claudication: Secondary | ICD-10-CM | POA: Diagnosis not present

## 2024-04-14 DIAGNOSIS — M5412 Radiculopathy, cervical region: Secondary | ICD-10-CM | POA: Diagnosis not present

## 2024-04-14 DIAGNOSIS — M4802 Spinal stenosis, cervical region: Secondary | ICD-10-CM | POA: Diagnosis not present

## 2024-04-14 DIAGNOSIS — Z79899 Other long term (current) drug therapy: Secondary | ICD-10-CM | POA: Diagnosis not present

## 2024-04-14 DIAGNOSIS — M5020 Other cervical disc displacement, unspecified cervical region: Secondary | ICD-10-CM | POA: Diagnosis not present

## 2024-04-14 DIAGNOSIS — M5416 Radiculopathy, lumbar region: Secondary | ICD-10-CM | POA: Diagnosis not present

## 2024-04-20 ENCOUNTER — Ambulatory Visit: Admitting: Physician Assistant

## 2024-04-20 ENCOUNTER — Encounter: Payer: Self-pay | Admitting: Physician Assistant

## 2024-04-20 VITALS — Wt 116.0 lb

## 2024-04-20 DIAGNOSIS — Z466 Encounter for fitting and adjustment of urinary device: Secondary | ICD-10-CM

## 2024-04-20 NOTE — Patient Instructions (Signed)
 [name]

## 2024-04-20 NOTE — Progress Notes (Signed)
 Cath Change/ Replacement  Patient is present today for a catheter change due to urinary retention.  18ml of water was removed from the balloon, a 16FR foley cath was removed without difficulty.  Patient was cleaned and prepped in a sterile fashion with betadine . A 16 FR foley cath was replaced into the bladder, no complications were noted. Urine return was noted 30ml and urine was clear in color. The balloon was filled with 10ml of sterile water. A night bag was attached for drainage.  Patient tolerated well.    Performed by: Kathreen Pare, PA-C   Follow up: Return in about 4 weeks (around 05/18/2024) for Catheter exchange.

## 2024-04-23 ENCOUNTER — Telehealth: Payer: Self-pay | Admitting: *Deleted

## 2024-04-23 NOTE — Telephone Encounter (Signed)
 Patient called in today and states her cathter is leaking . I ask patient if the urine was draining into the bag. She states the urine is draining into the bag. She thought her cathter was misses up . I advised patient that if it is leaking sometimes for her urethra she could be having a bladder spasms. Patient states yes she just wanted to make sure nothing was wrong.

## 2024-04-29 ENCOUNTER — Ambulatory Visit: Admitting: Internal Medicine

## 2024-05-05 ENCOUNTER — Encounter: Attending: Physician Assistant | Admitting: Physician Assistant

## 2024-05-05 DIAGNOSIS — I87312 Chronic venous hypertension (idiopathic) with ulcer of left lower extremity: Secondary | ICD-10-CM | POA: Insufficient documentation

## 2024-05-05 DIAGNOSIS — M86172 Other acute osteomyelitis, left ankle and foot: Secondary | ICD-10-CM | POA: Insufficient documentation

## 2024-05-05 DIAGNOSIS — E11622 Type 2 diabetes mellitus with other skin ulcer: Secondary | ICD-10-CM | POA: Diagnosis not present

## 2024-05-05 DIAGNOSIS — L89220 Pressure ulcer of left hip, unstageable: Secondary | ICD-10-CM | POA: Diagnosis not present

## 2024-05-05 DIAGNOSIS — L89513 Pressure ulcer of right ankle, stage 3: Secondary | ICD-10-CM | POA: Insufficient documentation

## 2024-05-05 DIAGNOSIS — L89214 Pressure ulcer of right hip, stage 4: Secondary | ICD-10-CM | POA: Insufficient documentation

## 2024-05-05 DIAGNOSIS — I5032 Chronic diastolic (congestive) heart failure: Secondary | ICD-10-CM | POA: Insufficient documentation

## 2024-05-05 DIAGNOSIS — Z7984 Long term (current) use of oral hypoglycemic drugs: Secondary | ICD-10-CM | POA: Insufficient documentation

## 2024-05-05 DIAGNOSIS — L97822 Non-pressure chronic ulcer of other part of left lower leg with fat layer exposed: Secondary | ICD-10-CM | POA: Diagnosis not present

## 2024-05-05 DIAGNOSIS — I890 Lymphedema, not elsewhere classified: Secondary | ICD-10-CM | POA: Diagnosis not present

## 2024-05-05 DIAGNOSIS — L97812 Non-pressure chronic ulcer of other part of right lower leg with fat layer exposed: Secondary | ICD-10-CM | POA: Diagnosis not present

## 2024-05-05 DIAGNOSIS — L89620 Pressure ulcer of left heel, unstageable: Secondary | ICD-10-CM | POA: Diagnosis not present

## 2024-05-05 DIAGNOSIS — L89610 Pressure ulcer of right heel, unstageable: Secondary | ICD-10-CM | POA: Diagnosis not present

## 2024-05-05 DIAGNOSIS — L89310 Pressure ulcer of right buttock, unstageable: Secondary | ICD-10-CM | POA: Diagnosis not present

## 2024-05-05 DIAGNOSIS — L84 Corns and callosities: Secondary | ICD-10-CM | POA: Insufficient documentation

## 2024-05-06 DIAGNOSIS — L97519 Non-pressure chronic ulcer of other part of right foot with unspecified severity: Secondary | ICD-10-CM | POA: Diagnosis not present

## 2024-05-18 ENCOUNTER — Ambulatory Visit: Admitting: Physician Assistant

## 2024-06-03 ENCOUNTER — Encounter: Attending: Physician Assistant | Admitting: Physician Assistant

## 2024-06-03 DIAGNOSIS — L89610 Pressure ulcer of right heel, unstageable: Secondary | ICD-10-CM | POA: Insufficient documentation

## 2024-06-03 DIAGNOSIS — I5032 Chronic diastolic (congestive) heart failure: Secondary | ICD-10-CM | POA: Insufficient documentation

## 2024-06-03 DIAGNOSIS — L89513 Pressure ulcer of right ankle, stage 3: Secondary | ICD-10-CM | POA: Insufficient documentation

## 2024-06-03 DIAGNOSIS — I890 Lymphedema, not elsewhere classified: Secondary | ICD-10-CM | POA: Diagnosis not present

## 2024-06-03 DIAGNOSIS — L97822 Non-pressure chronic ulcer of other part of left lower leg with fat layer exposed: Secondary | ICD-10-CM | POA: Insufficient documentation

## 2024-06-03 DIAGNOSIS — M86172 Other acute osteomyelitis, left ankle and foot: Secondary | ICD-10-CM | POA: Diagnosis not present

## 2024-06-03 DIAGNOSIS — I87312 Chronic venous hypertension (idiopathic) with ulcer of left lower extremity: Secondary | ICD-10-CM | POA: Diagnosis not present

## 2024-06-03 DIAGNOSIS — L89620 Pressure ulcer of left heel, unstageable: Secondary | ICD-10-CM | POA: Diagnosis not present

## 2024-06-03 DIAGNOSIS — E11622 Type 2 diabetes mellitus with other skin ulcer: Secondary | ICD-10-CM | POA: Insufficient documentation

## 2024-06-03 DIAGNOSIS — L97812 Non-pressure chronic ulcer of other part of right lower leg with fat layer exposed: Secondary | ICD-10-CM | POA: Insufficient documentation

## 2024-06-03 DIAGNOSIS — L89214 Pressure ulcer of right hip, stage 4: Secondary | ICD-10-CM | POA: Diagnosis not present

## 2024-06-03 DIAGNOSIS — L89623 Pressure ulcer of left heel, stage 3: Secondary | ICD-10-CM | POA: Diagnosis not present

## 2024-06-03 DIAGNOSIS — S81802A Unspecified open wound, left lower leg, initial encounter: Secondary | ICD-10-CM | POA: Diagnosis not present

## 2024-06-03 DIAGNOSIS — L89220 Pressure ulcer of left hip, unstageable: Secondary | ICD-10-CM | POA: Diagnosis not present

## 2024-06-04 ENCOUNTER — Ambulatory Visit: Admitting: Physician Assistant

## 2024-06-04 DIAGNOSIS — L03116 Cellulitis of left lower limb: Secondary | ICD-10-CM

## 2024-06-04 DIAGNOSIS — Z466 Encounter for fitting and adjustment of urinary device: Secondary | ICD-10-CM | POA: Diagnosis not present

## 2024-06-04 MED ORDER — MUPIROCIN 2 % EX OINT: 1.0000 | TOPICAL_OINTMENT | Freq: Two times a day (BID) | CUTANEOUS | 0 refills | Status: AC

## 2024-06-04 NOTE — Progress Notes (Signed)
 Cath Change/ Replacement  Patient is present today for a catheter change due to urinary retention.  18ml of water was removed from the balloon, a 16FR foley cath was removed without difficulty.  Patient was cleaned and prepped in a sterile fashion with betadine . A 16 FR foley cath was replaced into the bladder, no complications were noted. Urine return was noted 5ml and urine was clear in color. The balloon was filled with 20ml of sterile water. A night bag was attached for drainage.  Patient tolerated well.    Performed by: Jasun Gasparini, PA-C and Humberta Magallon-Mariche, CMA  Additional notes: She has some superficial, erythematous wounds around her [employer] from last month; suspect contact dermatitis vs abrasion from the side port on her night bag. Will prescribe mupirocin  for possible localized cellulitis without purulence.  Follow up: Return in about 4 weeks (around 07/02/2024) for Catheter exchange.

## 2024-06-11 ENCOUNTER — Ambulatory Visit: Admitting: Physician Assistant

## 2024-06-23 DIAGNOSIS — L97519 Non-pressure chronic ulcer of other part of right foot with unspecified severity: Secondary | ICD-10-CM | POA: Diagnosis not present

## 2024-07-01 ENCOUNTER — Ambulatory Visit: Admitting: Physician Assistant

## 2024-07-06 ENCOUNTER — Ambulatory Visit: Admitting: Physician Assistant

## 2024-07-08 ENCOUNTER — Ambulatory Visit: Admitting: Internal Medicine

## 2024-07-15 ENCOUNTER — Ambulatory Visit: Admitting: Physician Assistant

## 2024-07-15 VITALS — BP 83/44 | HR 103

## 2024-07-15 DIAGNOSIS — M48062 Spinal stenosis, lumbar region with neurogenic claudication: Secondary | ICD-10-CM | POA: Diagnosis not present

## 2024-07-15 DIAGNOSIS — M5416 Radiculopathy, lumbar region: Secondary | ICD-10-CM | POA: Diagnosis not present

## 2024-07-15 DIAGNOSIS — M5020 Other cervical disc displacement, unspecified cervical region: Secondary | ICD-10-CM | POA: Diagnosis not present

## 2024-07-15 DIAGNOSIS — Z466 Encounter for fitting and adjustment of urinary device: Secondary | ICD-10-CM | POA: Diagnosis not present

## 2024-07-15 NOTE — Progress Notes (Signed)
 Cath Change/ Replacement  Patient is present today for a catheter change due to urinary retention.  18ml of water was removed from the balloon, a 16FR foley cath was removed without difficulty.  Patient was cleaned and prepped in a sterile fashion with betadine . A 16 FR foley cath was replaced into the bladder, no complications were noted. Urine return was noted 5ml and urine was clear in color. The balloon was filled with 20ml of sterile water. A night bag was attached for drainage.  Patient tolerated well.    Performed by: Lakyn Mantione, PA-C   Follow up: Return in about 4 weeks (around 08/12/2024) for Catheter exchange.

## 2024-07-17 ENCOUNTER — Ambulatory Visit: Admitting: Physician Assistant

## 2024-07-21 ENCOUNTER — Encounter: Attending: Physician Assistant | Admitting: Physician Assistant

## 2024-07-21 DIAGNOSIS — L89220 Pressure ulcer of left hip, unstageable: Secondary | ICD-10-CM | POA: Diagnosis not present

## 2024-07-21 DIAGNOSIS — L89610 Pressure ulcer of right heel, unstageable: Secondary | ICD-10-CM | POA: Insufficient documentation

## 2024-07-21 DIAGNOSIS — L89513 Pressure ulcer of right ankle, stage 3: Secondary | ICD-10-CM | POA: Insufficient documentation

## 2024-07-21 DIAGNOSIS — L89214 Pressure ulcer of right hip, stage 4: Secondary | ICD-10-CM | POA: Diagnosis not present

## 2024-07-21 DIAGNOSIS — L84 Corns and callosities: Secondary | ICD-10-CM | POA: Diagnosis not present

## 2024-07-21 DIAGNOSIS — L97812 Non-pressure chronic ulcer of other part of right lower leg with fat layer exposed: Secondary | ICD-10-CM | POA: Diagnosis not present

## 2024-07-21 DIAGNOSIS — I890 Lymphedema, not elsewhere classified: Secondary | ICD-10-CM | POA: Insufficient documentation

## 2024-07-21 DIAGNOSIS — I5032 Chronic diastolic (congestive) heart failure: Secondary | ICD-10-CM | POA: Insufficient documentation

## 2024-07-21 DIAGNOSIS — L89894 Pressure ulcer of other site, stage 4: Secondary | ICD-10-CM | POA: Diagnosis not present

## 2024-07-21 DIAGNOSIS — M86172 Other acute osteomyelitis, left ankle and foot: Secondary | ICD-10-CM | POA: Diagnosis not present

## 2024-07-21 DIAGNOSIS — Z7984 Long term (current) use of oral hypoglycemic drugs: Secondary | ICD-10-CM | POA: Diagnosis not present

## 2024-07-21 DIAGNOSIS — E11622 Type 2 diabetes mellitus with other skin ulcer: Secondary | ICD-10-CM | POA: Diagnosis not present

## 2024-07-21 DIAGNOSIS — I87312 Chronic venous hypertension (idiopathic) with ulcer of left lower extremity: Secondary | ICD-10-CM | POA: Diagnosis not present

## 2024-07-21 DIAGNOSIS — L97822 Non-pressure chronic ulcer of other part of left lower leg with fat layer exposed: Secondary | ICD-10-CM | POA: Insufficient documentation

## 2024-07-21 DIAGNOSIS — L89620 Pressure ulcer of left heel, unstageable: Secondary | ICD-10-CM | POA: Diagnosis not present

## 2024-07-21 DIAGNOSIS — L89623 Pressure ulcer of left heel, stage 3: Secondary | ICD-10-CM | POA: Diagnosis not present

## 2024-07-23 DIAGNOSIS — L97519 Non-pressure chronic ulcer of other part of right foot with unspecified severity: Secondary | ICD-10-CM | POA: Diagnosis not present

## 2024-07-28 DIAGNOSIS — L03115 Cellulitis of right lower limb: Secondary | ICD-10-CM | POA: Diagnosis not present

## 2024-07-28 DIAGNOSIS — M79674 Pain in right toe(s): Secondary | ICD-10-CM | POA: Diagnosis not present

## 2024-07-28 DIAGNOSIS — L97512 Non-pressure chronic ulcer of other part of right foot with fat layer exposed: Secondary | ICD-10-CM | POA: Diagnosis not present

## 2024-07-28 DIAGNOSIS — I83019 Varicose veins of right lower extremity with ulcer of unspecified site: Secondary | ICD-10-CM | POA: Diagnosis not present

## 2024-07-28 DIAGNOSIS — L97919 Non-pressure chronic ulcer of unspecified part of right lower leg with unspecified severity: Secondary | ICD-10-CM | POA: Diagnosis not present

## 2024-07-28 DIAGNOSIS — B351 Tinea unguium: Secondary | ICD-10-CM | POA: Diagnosis not present

## 2024-07-28 DIAGNOSIS — M79675 Pain in left toe(s): Secondary | ICD-10-CM | POA: Diagnosis not present

## 2024-07-31 DIAGNOSIS — C50011 Malignant neoplasm of nipple and areola, right female breast: Secondary | ICD-10-CM | POA: Diagnosis not present

## 2024-07-31 DIAGNOSIS — L97502 Non-pressure chronic ulcer of other part of unspecified foot with fat layer exposed: Secondary | ICD-10-CM | POA: Diagnosis not present

## 2024-07-31 DIAGNOSIS — E538 Deficiency of other specified B group vitamins: Secondary | ICD-10-CM | POA: Diagnosis not present

## 2024-07-31 DIAGNOSIS — Z9011 Acquired absence of right breast and nipple: Secondary | ICD-10-CM | POA: Diagnosis not present

## 2024-07-31 DIAGNOSIS — D649 Anemia, unspecified: Secondary | ICD-10-CM | POA: Diagnosis not present

## 2024-07-31 DIAGNOSIS — I5032 Chronic diastolic (congestive) heart failure: Secondary | ICD-10-CM | POA: Diagnosis not present

## 2024-07-31 DIAGNOSIS — Z853 Personal history of malignant neoplasm of breast: Secondary | ICD-10-CM | POA: Diagnosis not present

## 2024-07-31 DIAGNOSIS — I10 Essential (primary) hypertension: Secondary | ICD-10-CM | POA: Diagnosis not present

## 2024-07-31 DIAGNOSIS — E1165 Type 2 diabetes mellitus with hyperglycemia: Secondary | ICD-10-CM | POA: Diagnosis not present

## 2024-07-31 DIAGNOSIS — E039 Hypothyroidism, unspecified: Secondary | ICD-10-CM | POA: Diagnosis not present

## 2024-08-06 DIAGNOSIS — Z23 Encounter for immunization: Secondary | ICD-10-CM | POA: Diagnosis not present

## 2024-08-06 DIAGNOSIS — N1832 Chronic kidney disease, stage 3b: Secondary | ICD-10-CM | POA: Diagnosis not present

## 2024-08-06 DIAGNOSIS — Z1331 Encounter for screening for depression: Secondary | ICD-10-CM | POA: Diagnosis not present

## 2024-08-06 DIAGNOSIS — E538 Deficiency of other specified B group vitamins: Secondary | ICD-10-CM | POA: Diagnosis not present

## 2024-08-06 DIAGNOSIS — E039 Hypothyroidism, unspecified: Secondary | ICD-10-CM | POA: Diagnosis not present

## 2024-08-06 DIAGNOSIS — M79674 Pain in right toe(s): Secondary | ICD-10-CM | POA: Diagnosis not present

## 2024-08-06 DIAGNOSIS — I10 Essential (primary) hypertension: Secondary | ICD-10-CM | POA: Diagnosis not present

## 2024-08-06 DIAGNOSIS — D649 Anemia, unspecified: Secondary | ICD-10-CM | POA: Diagnosis not present

## 2024-08-06 DIAGNOSIS — Z0000 Encounter for general adult medical examination without abnormal findings: Secondary | ICD-10-CM | POA: Diagnosis not present

## 2024-08-06 DIAGNOSIS — R262 Difficulty in walking, not elsewhere classified: Secondary | ICD-10-CM | POA: Diagnosis not present

## 2024-08-06 DIAGNOSIS — Z853 Personal history of malignant neoplasm of breast: Secondary | ICD-10-CM | POA: Diagnosis not present

## 2024-08-06 DIAGNOSIS — L97522 Non-pressure chronic ulcer of other part of left foot with fat layer exposed: Secondary | ICD-10-CM | POA: Diagnosis not present

## 2024-08-11 ENCOUNTER — Ambulatory Visit: Admitting: Physician Assistant

## 2024-08-13 ENCOUNTER — Ambulatory Visit: Admitting: Physician Assistant

## 2024-08-17 ENCOUNTER — Ambulatory Visit: Admitting: Physician Assistant

## 2024-08-21 ENCOUNTER — Ambulatory Visit: Admitting: Physician Assistant

## 2024-08-25 DIAGNOSIS — L97512 Non-pressure chronic ulcer of other part of right foot with fat layer exposed: Secondary | ICD-10-CM | POA: Diagnosis not present

## 2024-08-25 DIAGNOSIS — L97919 Non-pressure chronic ulcer of unspecified part of right lower leg with unspecified severity: Secondary | ICD-10-CM | POA: Diagnosis not present

## 2024-08-25 DIAGNOSIS — L03115 Cellulitis of right lower limb: Secondary | ICD-10-CM | POA: Diagnosis not present

## 2024-08-25 DIAGNOSIS — E1165 Type 2 diabetes mellitus with hyperglycemia: Secondary | ICD-10-CM | POA: Diagnosis not present

## 2024-08-26 ENCOUNTER — Encounter: Attending: Physician Assistant | Admitting: Physician Assistant

## 2024-08-26 DIAGNOSIS — Z7984 Long term (current) use of oral hypoglycemic drugs: Secondary | ICD-10-CM | POA: Diagnosis not present

## 2024-08-26 DIAGNOSIS — I5032 Chronic diastolic (congestive) heart failure: Secondary | ICD-10-CM | POA: Diagnosis not present

## 2024-08-26 DIAGNOSIS — I87312 Chronic venous hypertension (idiopathic) with ulcer of left lower extremity: Secondary | ICD-10-CM | POA: Insufficient documentation

## 2024-08-26 DIAGNOSIS — E11622 Type 2 diabetes mellitus with other skin ulcer: Secondary | ICD-10-CM | POA: Insufficient documentation

## 2024-08-26 DIAGNOSIS — L89610 Pressure ulcer of right heel, unstageable: Secondary | ICD-10-CM | POA: Diagnosis not present

## 2024-08-26 DIAGNOSIS — M86172 Other acute osteomyelitis, left ankle and foot: Secondary | ICD-10-CM | POA: Insufficient documentation

## 2024-08-26 DIAGNOSIS — L89214 Pressure ulcer of right hip, stage 4: Secondary | ICD-10-CM | POA: Diagnosis not present

## 2024-08-26 DIAGNOSIS — L89620 Pressure ulcer of left heel, unstageable: Secondary | ICD-10-CM | POA: Diagnosis not present

## 2024-08-26 DIAGNOSIS — L89513 Pressure ulcer of right ankle, stage 3: Secondary | ICD-10-CM | POA: Diagnosis not present

## 2024-08-26 DIAGNOSIS — L89220 Pressure ulcer of left hip, unstageable: Secondary | ICD-10-CM | POA: Insufficient documentation

## 2024-08-26 DIAGNOSIS — L84 Corns and callosities: Secondary | ICD-10-CM | POA: Insufficient documentation

## 2024-08-26 DIAGNOSIS — L89623 Pressure ulcer of left heel, stage 3: Secondary | ICD-10-CM | POA: Diagnosis not present

## 2024-08-26 DIAGNOSIS — L97812 Non-pressure chronic ulcer of other part of right lower leg with fat layer exposed: Secondary | ICD-10-CM | POA: Insufficient documentation

## 2024-08-26 DIAGNOSIS — L97822 Non-pressure chronic ulcer of other part of left lower leg with fat layer exposed: Secondary | ICD-10-CM | POA: Insufficient documentation

## 2024-08-26 DIAGNOSIS — E1169 Type 2 diabetes mellitus with other specified complication: Secondary | ICD-10-CM | POA: Insufficient documentation

## 2024-08-26 DIAGNOSIS — I890 Lymphedema, not elsewhere classified: Secondary | ICD-10-CM | POA: Insufficient documentation

## 2024-09-02 ENCOUNTER — Ambulatory Visit (INDEPENDENT_AMBULATORY_CARE_PROVIDER_SITE_OTHER): Admitting: Physician Assistant

## 2024-09-02 VITALS — BP 99/62 | HR 62 | Wt 116.0 lb

## 2024-09-02 DIAGNOSIS — Z466 Encounter for fitting and adjustment of urinary device: Secondary | ICD-10-CM | POA: Diagnosis not present

## 2024-09-02 DIAGNOSIS — B3731 Acute candidiasis of vulva and vagina: Secondary | ICD-10-CM

## 2024-09-02 MED ORDER — CLOTRIMAZOLE 1 % EX CREA: 1.0000 | TOPICAL_CREAM | Freq: Two times a day (BID) | CUTANEOUS | 0 refills | Status: AC

## 2024-09-02 NOTE — Progress Notes (Signed)
 Cath Change/ Replacement  Patient is present today for a catheter change due to urinary retention.  19ml of water was removed from the balloon, a 16FR foley cath was removed without difficulty.  Patient was cleaned and prepped in a sterile fashion with betadine . A 16 FR foley cath was replaced into the bladder, no complications were noted. Urine return was noted 10ml and urine was clear in color. The balloon was filled with 20ml of sterile water. A night bag was attached for drainage. Patient tolerated well.    Performed by: Kailin Principato, PA-C   Additional notes: She requests topical antifungal cream for vulvovaginal pruritus. Clotrimazole sent.  Follow up: Return in about 4 weeks (around 09/30/2024) for Catheter exchange.

## 2024-09-02 NOTE — Addendum Note (Signed)
 Addended by: Gara Kincade E on: 09/02/2024 03:26 PM   Modules accepted: Orders

## 2024-09-23 ENCOUNTER — Encounter: Admitting: Physician Assistant

## 2024-09-30 ENCOUNTER — Ambulatory Visit (INDEPENDENT_AMBULATORY_CARE_PROVIDER_SITE_OTHER): Admitting: Physician Assistant

## 2024-09-30 VITALS — BP 98/63 | HR 83

## 2024-09-30 DIAGNOSIS — Z466 Encounter for fitting and adjustment of urinary device: Secondary | ICD-10-CM | POA: Diagnosis not present

## 2024-09-30 NOTE — Progress Notes (Signed)
 Cath Change/ Replacement  Patient is present today for a catheter change due to urinary retention.  18ml of water was removed from the balloon, a 16FR foley cath was removed without difficulty.  Patient was cleaned and prepped in a sterile fashion with betadine . A 16 FR foley cath was replaced into the bladder, no complications were noted. Urine return was noted 3ml and urine was clear in color. The balloon was filled with 20ml of sterile water. A night bag was attached for drainage.  Patient tolerated well.    Performed by: Lucie Hones, PA-C   Follow up: Return in about 4 weeks (around 10/28/2024) for Catheter exchange.

## 2024-10-01 ENCOUNTER — Encounter: Attending: Physician Assistant | Admitting: Physician Assistant

## 2024-10-01 DIAGNOSIS — L89220 Pressure ulcer of left hip, unstageable: Secondary | ICD-10-CM | POA: Insufficient documentation

## 2024-10-01 DIAGNOSIS — I87312 Chronic venous hypertension (idiopathic) with ulcer of left lower extremity: Secondary | ICD-10-CM | POA: Diagnosis not present

## 2024-10-01 DIAGNOSIS — I5032 Chronic diastolic (congestive) heart failure: Secondary | ICD-10-CM | POA: Diagnosis not present

## 2024-10-01 DIAGNOSIS — L89891 Pressure ulcer of other site, stage 1: Secondary | ICD-10-CM | POA: Diagnosis not present

## 2024-10-01 DIAGNOSIS — L89620 Pressure ulcer of left heel, unstageable: Secondary | ICD-10-CM | POA: Insufficient documentation

## 2024-10-01 DIAGNOSIS — L97822 Non-pressure chronic ulcer of other part of left lower leg with fat layer exposed: Secondary | ICD-10-CM | POA: Diagnosis not present

## 2024-10-01 DIAGNOSIS — L89214 Pressure ulcer of right hip, stage 4: Secondary | ICD-10-CM | POA: Insufficient documentation

## 2024-10-01 DIAGNOSIS — E11622 Type 2 diabetes mellitus with other skin ulcer: Secondary | ICD-10-CM | POA: Diagnosis not present

## 2024-10-01 DIAGNOSIS — L97812 Non-pressure chronic ulcer of other part of right lower leg with fat layer exposed: Secondary | ICD-10-CM | POA: Insufficient documentation

## 2024-10-01 DIAGNOSIS — Z7984 Long term (current) use of oral hypoglycemic drugs: Secondary | ICD-10-CM | POA: Insufficient documentation

## 2024-10-01 DIAGNOSIS — L89610 Pressure ulcer of right heel, unstageable: Secondary | ICD-10-CM | POA: Insufficient documentation

## 2024-10-01 DIAGNOSIS — L84 Corns and callosities: Secondary | ICD-10-CM | POA: Insufficient documentation

## 2024-10-01 DIAGNOSIS — M86172 Other acute osteomyelitis, left ankle and foot: Secondary | ICD-10-CM | POA: Insufficient documentation

## 2024-10-01 DIAGNOSIS — L89513 Pressure ulcer of right ankle, stage 3: Secondary | ICD-10-CM | POA: Insufficient documentation

## 2024-10-15 DIAGNOSIS — Z79899 Other long term (current) drug therapy: Secondary | ICD-10-CM | POA: Diagnosis not present

## 2024-10-15 DIAGNOSIS — M5020 Other cervical disc displacement, unspecified cervical region: Secondary | ICD-10-CM | POA: Diagnosis not present

## 2024-10-15 DIAGNOSIS — M48062 Spinal stenosis, lumbar region with neurogenic claudication: Secondary | ICD-10-CM | POA: Diagnosis not present

## 2024-10-15 DIAGNOSIS — M5412 Radiculopathy, cervical region: Secondary | ICD-10-CM | POA: Diagnosis not present

## 2024-10-15 DIAGNOSIS — M4802 Spinal stenosis, cervical region: Secondary | ICD-10-CM | POA: Diagnosis not present

## 2024-10-15 DIAGNOSIS — M5416 Radiculopathy, lumbar region: Secondary | ICD-10-CM | POA: Diagnosis not present

## 2024-10-15 NOTE — Progress Notes (Signed)
 HPI The patient is a pleasant 83 year-old female who presents today for follow up of acute on chronic bilateral posterior cervical spine pain without radiation to the upper extremities.  She reports that her pain has been ongoing for since 2018 without injury.  Describes her pain as moderate to severe that is aching and constant with a feeling of occasional weakness.  Her pain is progressively increasing and worsened with cervical rotation and sitting.  Lying in bed can help alleviate her pain.  Norco 5/325 1 tablet p.o. twice daily as needed provides moderate to good relief of her pain.  She is followed for acute bilateral low back pain without radiation to the lower extremities.  Pain is rated moderate to severe that began in November 2020 without injury.  In the past she was followed for back pain and and MRI of the lumbar spine in 2010 revealed severe stenosis at L4-5 and surgical consult was recommended.  She states that she did not meet with a neurosurgeon and is not having any sort of surgical intervention.  She denies any changes to her bowel or bladder or saddle anesthesia.  Medications include Tylenol  500 mg approximately 6 tablets/day (moderate relief).  She presents today with her husband in a wheelchair.  She uses a rolling walker at home and brings the wheelchair when she needs to walk any length of distance.  She states she can walk for approximately 40 feet before feeling the need to sit.  She was evaluated by Dr. Clois on 04/28/2020 at which time a collar was prescribed and she was to follow-up with his office in 2 to 3 months with scoliosis x-rays.  She has since been evaluated by Dr. Katrina on 06/23/2020 at which time it was recommended that she continue nonoperative treatment as it was not thought that she would be a good candidate for cervical thoracic reconstruction.  She has been evaluated for bilateral lower extremity edema and ulceration on amoxicillin  and doxycycline  not  able to tolerate amoxicillin .  She was admitted to the hospital on 08/16/2022 for cellulitis of the lower extremity, admitted to the hospital on 08/22/2022 for acute diverticulitis and confusion, evaluated for urinary retention on 09/05/2022, evaluated the emergency department on 10/09/2022 for obstruction of the Foley catheter. She was admitted on 11/01/2022 to the hospital for mental status related to sepsis involving wound on the right foot.  She had a lower extremity thrombectomy and IVC filter on 1226.  She was admitted again to the hospital on 11/03/2022 and discharged on 11/09/2022.  She was admitted on 01/05/2023 for wound infection.  She was admitted to the hospital on 05/22/2023 at which time she was treated for right hip pressure ulcer and bilateral heel pressure injuries along with sacral unstageable pressure injury and left hip unstageable pressure injury.  She was last evaluated on 07/15/2024 at which time she was experiencing well-managed pain with hydrocodone .  She was to continue with along with Tylenol  and gabapentin .  She was to begin home health for possible social work.  At today's visit she describes well-managed pain taking hydrocodone  7.5/325 twice daily as needed occasionally 3 times a day.  She keeps a medication as a place and does not share with others.  She is not interested in any treatment changes.  No concerns for side effects or questions for today's visit.  She is trying to continue increasing caloric intake and trying to move is much as she can.  The [place]  Narcotic Database was reviewed  today.  The patient has signed a curator contract (04/14/2024).  We have discussed the realistic benefits and known risk of opioid therapy.  Patient verbalized understanding. Dr. Avanell and I dicussed the role of long term narcotics for this patient and is in agreeance with the medication prescribed.  UDS toxassure 13 performed on 01/13/2024 (appropriate)    Procedures: 06/19/2022: Right L5-S1 and left S1 transforaminal ESI (50% relief) 02/09/2022: Right L5-S1 and left S1 transforaminal ESI (50% relief) 11/21/2021: Left C5-6 transforaminal ESI (50% relief) 10/30/2021: Left knee joint injection (1 month relief of 75%) 07/25/2021: Left C5-6 transforaminal ESI (50% improvement) 07/03/2021: Left subacromial injection (50% improvement) 01/26/2021: Right C5-6 transforaminal ESI  01/04/2021: Right L5-S1 and left S1 transforaminal ESI 10/20/2020: Bilateral S1 transforaminal ESI (moderate relief) 09/20/2020: Right C5-6 transforaminal ESI (mild to moderate relief) 08/17/2020: Bilateral S1 transforaminal ESI (mild relief)  07/19/2020: Left C5-6 transforaminal ESI (moderate relief) 04/04/2020: Left knee joint injection (mild to moderate relief) 03/10/2020: Left C2-3 and C3-4 facet joint injections (moderate relief) 01/20/2020: Right C2-3 and C3-4 facet joint injections (moderate relief) 12/09/2019: Bilateral S1 transforaminal ESI (good relief) 11/18/2019: Bilateral S1 transforaminal ESI (at least 40 to 50% relief)   Past Medical History:  Diagnosis Date   Cancer of right breast (CMS/HHS-HCC) 04/17/2015   Diabetes mellitus type 2, uncomplicated (CMS/HHS-HCC)    History of kidney stones    Hyperlipidemia    Hypertension    Lymphedema 04/30/2017   Last Assessment & Plan:  The patient clearly has some lymphedema from chronic scarring in the lymphatic channels.  This would be at least stage II lymphedema with ulcerations previously and may be stage III as she had weeping and ulcerations before.  I believe she would benefit from a lymphedema pump and we will try to obtain one.  She will continue with compression in the form of an Unna boot alt   Osteopenia    Osteoporosis, post-menopausal    Personal history of malignant neoplasm of breast 02/05/2013   Right mastectomy   Prolapse of vaginal vault after hysterectomy 03/03/2013   Rotator cuff  disorder     Past Surgical History:  Procedure Laterality Date   LAPAROSCOPIC CHOLECYSTECTOMY W/ CHOLANGIOGRAPHY  1992   MASTECTOMY PARTIAL / LUMPECTOMY  1992   REPAIR ROTATOR CUFF TEAR CHRONIC OPEN Right 2004   OPEN REDUCTION ANKLE FRACTURE  2005   HYSTERECTOMY  05/26/2012   TVH, anterior repair. Dx: symptomatic pelvic relaxation   MASTECTOMY SIMPLE Right 02/05/2013    Social History   Socioeconomic History   Marital status: Married  Occupational History   Occupation: Semi-Retired  Tobacco Use   Smoking status: Never   Smokeless tobacco: Never  Vaping Use   Vaping status: Never Used  Substance and Sexual Activity   Alcohol  use: No   Drug use: No   Sexual activity: Yes    Partners: Male   Social Drivers of Corporate Investment Banker Strain: Low Risk  (02/14/2024)   Overall Financial Resource Strain (CARDIA)    Difficulty of Paying Living Expenses: Not hard at all  Food Insecurity: No Food Insecurity (02/14/2024)   Hunger Vital Sign    Worried About Running Out of Food in the Last Year: Never true    Ran Out of Food in the Last Year: Never true  Transportation Needs: No Transportation Needs (02/14/2024)   PRAPARE - Administrator, Civil Service (Medical): No    Lack of Transportation (Non-Medical): No    Current  Outpatient Medications on File Prior to Visit  Medication Sig Dispense Refill   acetaminophen  (TYLENOL  ORAL) Take 500 mg by mouth 4 (four) times daily     allopurinoL  (ZYLOPRIM ) 300 MG tablet TAKE ONE TABLET BY MOUTH ONCE DAILY 90 tablet 1   aspirin  81 MG EC tablet Take 81 mg by mouth once daily     atorvastatin  (LIPITOR) 40 MG tablet TAKE 1 TABLET BY MOUTH AT BEDTIME FOR CHOLESTEROL 90 tablet 0   colestipoL (COLESTID) 1 gram tablet Take 1 tablet (1 g total) by mouth 2 (two) times daily 60 tablet 3   cyanocobalamin  (VITAMIN B12) 1000 MCG tablet take 1 tablet by mouth once daily. 30 tablet 11   DAKIN'S SOLUTION 0.125 % Soln  solution moisten gauze FOR wet TO DRY dressings.     diclofenac (VOLTAREN) 1 % topical gel APPLY 2 GRAMS TOPICALLY TO THE AFFECTED AREA(S) 4 TIMES DAILY. 200 g 11   diphenoxylate -atropine  (LOMOTIL ) 2.5-0.025 mg tablet TAKE TWO TABLETS BY MOUTH THREE TIMES DAILY AS NEEDED FOR DIARRHEA 30 tablet 0   ergocalciferol , vitamin D2, 1,250 mcg (50,000 unit) capsule TAKE 1 CAPSULE BY MOUTH ONCE MONTH 1 capsule 10   FEROSUL 325 mg (65 mg iron ) tablet Take 325 mg by mouth 2 (two) times daily with meals     FUROsemide  (LASIX ) 20 MG tablet Take 1 tablet (20 mg total) by mouth once daily 30 tablet 2   FUROsemide  (LASIX ) 40 MG tablet Take 1 tablet (40 mg total) by mouth once daily 90 tablet 1   gabapentin  (NEURONTIN ) 300 MG capsule TAKE ONE CAPSULE BY MOUTH AT BEDTIME 90 capsule 1   gentamicin 0.1 % cream Apply topically     iron  polysaccharides 150 mg iron  capsule Take 1 capsule (150 mg total) by mouth once daily 30 capsule 5   JANUVIA 50 mg tablet TAKE 1 TABLET BY MOUTH ONCE DAILY. 90 tablet 0   levothyroxine  (SYNTHROID ) 112 MCG tablet Take 1 tablet (112 mcg total) by mouth once daily Take on an empty stomach with a glass of water at least 30-60 minutes before breakfast. 90 tablet 1   linezolid  (ZYVOX ) 600 mg tablet Take 600 mg by mouth 2 (two) times daily     metFORMIN (GLUCOPHAGE) 1000 MG tablet Take 1 tablet (1,000 mg total) by mouth 2 (two) times daily 180 tablet 1   miscellaneous medical supply Misc Mastectomy bra and prosthesis 6 each 0   polyethylene glycol (MIRALAX ) packet Take 17 g by mouth once daily as needed for Constipation     REPLENS EXTERNAL COMFORT Gel APPLY TO THE AFFECTED AREA(S) ONCE DAILY     SANTYL  ointment APPLY TO THE AFFECTED AREA(S) EVERY DAY TO THE TO WOUND BED     VITAMIN B-1 100 MG tablet TAKE ONE TABLET BY MOUTH EVERY DAY 30 tablet 1   lisinopriL  (ZESTRIL ) 5 MG tablet Take 1 tablet (5 mg total) by mouth once daily 30 tablet 5   naloxone (NARCAN) 4 mg/actuation  nasal spray Place 1 spray (4 mg total) into one nostril once as needed (if not breathing or overdose is suspected.) for up to 1 dose Give 2nd dose in 5-10 min if not responding or if sx return for up to 1 dose. 2 each 1   No current facility-administered medications on file prior to visit.    Allergies as of 10/15/2024 - Reviewed 10/15/2024  Allergen Reaction Noted   Other Anaphylaxis 03/03/2015   Sulfa (sulfonamide antibiotics) Rash 01/11/2014   ROS  More than 10 system, review of system form was given to the patient to fill out and has been signed by [employer] FNP-C and scanned into the patient's chart.   Vital signs Vitals:   10/15/24 1221  BP: 119/70  Pulse: 85  Temp: 36.2 C (97.1 F)  TempSrc: Oral  Weight: 51.3 kg (113 lb 1.5 oz)  Height: 149.9 cm (4' 11)  PainSc:   7  PainLoc: Back     Exam General: Alert oriented no distress sitting in wheelchair  Cervical exam (12/22/2019) Upon inspection there are no rashes or scars.  She has diffuse tenderness throughout the cervical paraspinal musculature.  Moderate to severe limitation in cervical rotation.  Spurling's maneuver is very limited and produces posterior cervical spine pain without radiation.  Upper extremity exam She has 5-/5 strength in bilateral wrist extensors, biceps, triceps, deltoids, shoulder internal and external rotators.  Sensation intact to light touch bilaterally.  She has ++1 bilateral bicep, tricep and brachioradialis reflexes bilaterally.  Negative Hoffmann's bilaterally.  Lumbosacral Exam (performed 11/09/2019) Upon inspection there are no rashes or scars.  She has mild tenderness of ambulation to the bilateral lumbosacral paraspinal musculature.  Lower Extremity Exam She has 5/5 strength in bilateral dorsiflexors, knee extensors, 4/5 strength in bilateral hip flexors.  Sensation intact to light touch bilaterally.  She has +1 right patellar reflex and trace bilateral Achilles reflexes.  Left  patellar reflex was not tested as patient had a brace to the left knee.  Left knee exam (04/04/2020) Mild crepitus noted with extension and flexion of the left lower extremity.  She has mild to moderate tenderness upon palpation to the left medial aspect of the knee joint.  No laxity noted.  No edema or erythema noted.  Radiographic Data   Impression 1.  Acute on chronic bilateral posterior cervical spine pain without radiation to the upper extremities.  Her pain is increased with cervical rotation.  Clinically her symptoms are most consistent with facet mediated pain and possible discogenic pain.  There seems to also be an element of musculoskeletal spasm. MRI cervical spine without contrast from [facility] dated 04/17/2020 imaging and report reviewed today. C2-3 moderate right and mild left facet arthropathy. No central or foraminal stenosis. C3-4 disc bulging with endplate osteophytes. Moderate right greater than left facet arthropathy. No central stenosis. Mild to moderate right-sided foraminal stenosis. C4-5 disc bulging with endplate osteophytes. Moderate left-sided foraminal stenosis. No central stenosis. C5-6 disc bulging with superimposed central disc protrusion and endplate osteophytes. Moderate to severe central stenosis. Moderate bilateral foraminal stenosis. C6-7 disc bulging with central disc protrusion. Moderate central stenosis. Mild bilateral foraminal stenosis. C7-T1 moderate bilateral facet arthropathy without stenosis.  Cervical spine x-ray from [facility] dated 10/01/2019 imaging and report reviewed today.  She is noted to have multilevel degenerative disc disease and facet degenerative changes most pronounced at C2-3 and C3-4 on the right.  2.  Acute bilateral low back pain without radiation to the lower extremities.  Patient is able to walk for approximately 40 feet before feeling the need to sit due to her back pain.  She denies any radiating pain to the lower extremities.   Clinically her symptoms most consistent with elements of discogenic and facet mediated pain along with spinal stenosis without neurogenic claudication.[facility] lumbar spine xray dated 10/01/2019 mild wedging of the L1 vertebrae with multilevel degenerative changes. MRI LUMBAR SPINE WITHOUT AND WITH CONTRAST  TECHNIQUE:  Multiplanar and multiecho pulse sequences of the lumbar spine were  obtained without and with intravenous contrast.  CONTRAST:  5mL GADAVIST  GADOBUTROL  1 MMOL/ML IV SOLN  COMPARISON:  CT abdomen and pelvis 12/19/2022; MRI lumbar spine  08/24/2022  FINDINGS:  Despite efforts by the technologist and patient, moderate motion  artifact is present on today's exam and could not be eliminated.  This reduces exam sensitivity and specificity.  Segmentation:  Standard.  Alignment: Mild retrolisthesis of L1 on L2 and L2 on L3, unchanged.  Trace anterolisthesis of L4 on L5, unchanged.  Vertebrae: Vertebral body heights are maintained. Severe anterior  T12-L1, severe right-greater-than-left L1-2, severe left L2-3, mild  left L3-4, and mild left L4-5 disc space narrowing, similar to  prior. Trace fluid within the mid to right aspect of the L1-2 disc  is very similar to 08/24/2022 and favored to be degenerative. No  endplate erosions or edema to suggest discitis/osteomyelitis.  Conus medullaris and cauda equina: The conus terminates at the  approximate L1-2 level. Conus and cauda equina appear grossly normal  within the confines of motion artifact.  Paraspinal and other soft tissues: No gross abnormality is seen  within the retroperitoneal soft tissues. No suspicious fluid  collection or enhancement.  Disc levels:  T12-L1: Mild broad-based posterior disc osteophyte complex. No  central canal or neuroforaminal stenosis. No significant change.  L1-2: Moderate right-greater-than-left posterior disc osteophyte  complex with minimal, migration on the right, similar to prior.   Within the limitations of motion artifact, no definite change from  prior. Mild right lateral recess narrowing without central canal  stenosis. No definite neuroforaminal stenosis.  L2-3: Moderate to severe bilateral facet joint hypertrophy. Mild  retrolisthesis and moderate broad-based posterior disc osteophyte  complex. Severe central canal stenosis appears unchanged. Severe  left and mild right neuroforaminal stenosis is grossly unchanged.  L3-4: Moderate bilateral facet joint hypertrophy. Mild-to-moderate  broad-based posterior disc bulge. Mild central canal stenosis  appears unchanged. No definitive neuroforaminal stenosis.  L4-5: Severe bilateral facet joint hypertrophy. Trace  anterolisthesis, unchanged. Moderate ligamentum flavum hypertrophy.  Mild broad-based posterior disc bulge. Moderate to severe central  canal stenosis, unchanged. Mild left-greater-than-right  neuroforaminal stenosis, grossly unchanged.  L5-S1: Severe right-greater-than-left facet joint hypertrophy. Mild  posterior disc bulge. No central canal or neuroforaminal stenosis.  No significant change.  IMPRESSION:  Compared to 08/24/2022:  1. Moderate motion artifact technically limits this examination.  2. Multilevel degenerative disc and joint changes as above,  including severe L2-3 and moderate to severe L4-5 central canal  stenosis, unchanged from prior.  3. Multilevel neuroforaminal stenosis including severe left L2-3  neuroforaminal stenosis, unchanged.  4. Mild fluid within the L1-2 disc space is similar to prior and  again favored to be degenerative. No definite evidence of  discitis/osteomyelitis.  Electronically Signed    By: Tanda Lyons M.D.    On: 01/05/2023 20:51   3.  Chronic severe left knee pain previously followed by orthopedics.Left knee x[facility] dated 09/29/2019 shows complete loss of joint space in the medial compartment.  Severe degenerative changes.  4.   Hypertension, hyperlipidemia 5.  Type 2 diabetes  6.  Breast cancer in 2016, patient has been evaluated by oncology for easy bruising and reports that she was told that it was related to aging.  Platelets within normal range.  7.  Left shoulder impingement syndrome 8.  History of sepsis in fall 2023 related to right foot infection, DVT and IVC filter placement along with sacral pressure ulcer.  Plan 1.  Continue with Tylenol   500 mg twice daily. 2.  Continue gabapentin  100 mg daily twice daily, #60, 5 refills sent to pharmacy.  Her husband is present 24/7 and will monitor for any possible side effects of sedation, altered cognition, dizziness, impaired balance, or otherwise. 3.  Continue Norco 7.5/325 1 tablet p.o. 3 times daily as needed #90 do not fill until 10/23/2024, 11/22/2024, 12/23/2023 Today we discussed side effects patient verbalized understanding. 4.  Continue Voltaren gel as previously prescribed. 5.  Order placed for home health for possible social work. 7.  She will follow-up with me in 3 months for reevaluation.  I would not do any injections until she has complete healing of her pressure wounds.  I personally performed the service, non-incident to.  (WP)  WHITNEY MEELER, NP  This note was generated in part with voice recognition software and I apologize for any typographical errors that were not detected and corrected.  PCP: Dr. Hande

## 2024-10-28 ENCOUNTER — Encounter: Admitting: Internal Medicine

## 2024-10-28 ENCOUNTER — Ambulatory Visit: Admitting: Physician Assistant

## 2024-10-29 ENCOUNTER — Encounter: Admitting: Internal Medicine

## 2024-11-09 ENCOUNTER — Ambulatory Visit: Admitting: Physician Assistant

## 2024-11-09 DIAGNOSIS — Z466 Encounter for fitting and adjustment of urinary device: Secondary | ICD-10-CM

## 2024-11-09 DIAGNOSIS — R339 Retention of urine, unspecified: Secondary | ICD-10-CM

## 2024-11-09 NOTE — Progress Notes (Signed)
 Simple Catheter Placement  Due to urinary retention patient is present today for a foley cath placement.  Patient was cleaned and prepped in a sterile fashion with betadine . A 16FR foley catheter was inserted, urine return was noted  50ml, urine was pale yellow in color. The balloon was filled with 20cc of sterile water.  A night bag was attached for drainage. Patient was given instruction on proper catheter care.  Patient tolerated well, no complications were noted.  Performed by: Santa Gang, CMA (AAMA)  Additional notes/ Follow up: Return in about 4 weeks (around 12/07/2024).

## 2024-11-18 ENCOUNTER — Encounter: Admitting: Physician Assistant

## 2024-11-23 ENCOUNTER — Encounter: Admitting: Physician Assistant

## 2024-11-30 ENCOUNTER — Encounter: Attending: Physician Assistant | Admitting: Physician Assistant

## 2024-11-30 DIAGNOSIS — E11622 Type 2 diabetes mellitus with other skin ulcer: Secondary | ICD-10-CM | POA: Diagnosis present

## 2024-11-30 DIAGNOSIS — L89893 Pressure ulcer of other site, stage 3: Secondary | ICD-10-CM | POA: Insufficient documentation

## 2024-11-30 DIAGNOSIS — I87312 Chronic venous hypertension (idiopathic) with ulcer of left lower extremity: Secondary | ICD-10-CM | POA: Diagnosis not present

## 2024-11-30 DIAGNOSIS — M86172 Other acute osteomyelitis, left ankle and foot: Secondary | ICD-10-CM | POA: Insufficient documentation

## 2024-11-30 DIAGNOSIS — L97812 Non-pressure chronic ulcer of other part of right lower leg with fat layer exposed: Secondary | ICD-10-CM | POA: Insufficient documentation

## 2024-11-30 DIAGNOSIS — Z7984 Long term (current) use of oral hypoglycemic drugs: Secondary | ICD-10-CM | POA: Insufficient documentation

## 2024-11-30 DIAGNOSIS — L84 Corns and callosities: Secondary | ICD-10-CM | POA: Diagnosis not present

## 2024-11-30 DIAGNOSIS — L89513 Pressure ulcer of right ankle, stage 3: Secondary | ICD-10-CM | POA: Insufficient documentation

## 2024-11-30 DIAGNOSIS — L89214 Pressure ulcer of right hip, stage 4: Secondary | ICD-10-CM | POA: Insufficient documentation

## 2024-11-30 DIAGNOSIS — I890 Lymphedema, not elsewhere classified: Secondary | ICD-10-CM | POA: Diagnosis not present

## 2024-11-30 DIAGNOSIS — L89620 Pressure ulcer of left heel, unstageable: Secondary | ICD-10-CM | POA: Diagnosis not present

## 2024-11-30 DIAGNOSIS — L89220 Pressure ulcer of left hip, unstageable: Secondary | ICD-10-CM | POA: Diagnosis not present

## 2024-11-30 DIAGNOSIS — L89610 Pressure ulcer of right heel, unstageable: Secondary | ICD-10-CM | POA: Diagnosis not present

## 2024-11-30 DIAGNOSIS — I5032 Chronic diastolic (congestive) heart failure: Secondary | ICD-10-CM | POA: Diagnosis not present

## 2024-11-30 DIAGNOSIS — L97822 Non-pressure chronic ulcer of other part of left lower leg with fat layer exposed: Secondary | ICD-10-CM | POA: Diagnosis not present

## 2024-12-10 ENCOUNTER — Ambulatory Visit

## 2024-12-14 ENCOUNTER — Ambulatory Visit

## 2024-12-28 ENCOUNTER — Encounter: Admitting: Physician Assistant
